# Patient Record
Sex: Male | Born: 1955 | Race: Black or African American | Hispanic: No | Marital: Married | Smoking: Never smoker
Health system: Southern US, Community
[De-identification: ages and names within clinical notes are randomized; demographics above are authoritative.]

## PROBLEM LIST (undated history)

## (undated) DIAGNOSIS — K592 Neurogenic bowel, not elsewhere classified: Secondary | ICD-10-CM

## (undated) DIAGNOSIS — G8220 Paraplegia, unspecified: Secondary | ICD-10-CM

## (undated) DIAGNOSIS — E2740 Unspecified adrenocortical insufficiency: Secondary | ICD-10-CM

## (undated) DIAGNOSIS — C801 Malignant (primary) neoplasm, unspecified: Secondary | ICD-10-CM

## (undated) DIAGNOSIS — E119 Type 2 diabetes mellitus without complications: Secondary | ICD-10-CM

## (undated) DIAGNOSIS — K56600 Partial intestinal obstruction, unspecified as to cause: Secondary | ICD-10-CM

## (undated) DIAGNOSIS — R310 Gross hematuria: Secondary | ICD-10-CM

## (undated) DIAGNOSIS — N319 Neuromuscular dysfunction of bladder, unspecified: Secondary | ICD-10-CM

## (undated) DIAGNOSIS — Z9289 Personal history of other medical treatment: Secondary | ICD-10-CM

## (undated) DIAGNOSIS — C9000 Multiple myeloma not having achieved remission: Secondary | ICD-10-CM

## (undated) DIAGNOSIS — D649 Anemia, unspecified: Secondary | ICD-10-CM

## (undated) DIAGNOSIS — D689 Coagulation defect, unspecified: Secondary | ICD-10-CM

## (undated) HISTORY — DX: Partial intestinal obstruction, unspecified as to cause: K56.600

## (undated) HISTORY — DX: Malignant (primary) neoplasm, unspecified: C80.1

## (undated) HISTORY — DX: Neuromuscular dysfunction of bladder, unspecified: N31.9

## (undated) HISTORY — DX: Neurogenic bowel, not elsewhere classified: K59.2

## (undated) HISTORY — DX: Multiple myeloma not having achieved remission: C90.00

---

## 1999-07-23 ENCOUNTER — Emergency Department (HOSPITAL_COMMUNITY): Admission: EM | Admit: 1999-07-23 | Discharge: 1999-07-23 | Payer: Self-pay | Admitting: Emergency Medicine

## 1999-07-23 ENCOUNTER — Encounter: Payer: Self-pay | Admitting: Emergency Medicine

## 1999-11-30 ENCOUNTER — Encounter: Admission: RE | Admit: 1999-11-30 | Discharge: 1999-11-30 | Payer: Self-pay | Admitting: Family Medicine

## 2002-05-20 ENCOUNTER — Encounter: Admission: RE | Admit: 2002-05-20 | Discharge: 2002-05-20 | Payer: Self-pay | Admitting: Family Medicine

## 2003-06-22 ENCOUNTER — Encounter: Admission: RE | Admit: 2003-06-22 | Discharge: 2003-06-22 | Payer: Self-pay | Admitting: Sports Medicine

## 2006-09-20 DIAGNOSIS — E669 Obesity, unspecified: Secondary | ICD-10-CM | POA: Insufficient documentation

## 2006-11-01 ENCOUNTER — Ambulatory Visit: Payer: Self-pay | Admitting: Family Medicine

## 2006-11-23 ENCOUNTER — Encounter (INDEPENDENT_AMBULATORY_CARE_PROVIDER_SITE_OTHER): Payer: Self-pay | Admitting: Family Medicine

## 2006-11-23 ENCOUNTER — Ambulatory Visit: Payer: Self-pay | Admitting: Family Medicine

## 2006-11-23 LAB — CONVERTED CEMR LAB
BUN: 13 mg/dL (ref 6–23)
CO2: 23 meq/L (ref 19–32)
Calcium: 8.7 mg/dL (ref 8.4–10.5)
Chloride: 106 meq/L (ref 96–112)
Cholesterol: 140 mg/dL (ref 0–200)
Creatinine, Ser: 0.91 mg/dL (ref 0.40–1.50)
Glucose, Bld: 70 mg/dL (ref 70–99)
HDL: 43 mg/dL (ref >39)
LDL Cholesterol: 88 mg/dL (ref 0–99)
Potassium: 4.1 meq/L (ref 3.5–5.3)
Sodium: 141 meq/L (ref 135–145)
Total CHOL/HDL Ratio: 3.3
Triglycerides: 46 mg/dL (ref <150)
VLDL: 9 mg/dL (ref 0–40)

## 2006-11-25 ENCOUNTER — Encounter (INDEPENDENT_AMBULATORY_CARE_PROVIDER_SITE_OTHER): Payer: Self-pay | Admitting: Family Medicine

## 2010-07-06 ENCOUNTER — Telehealth (INDEPENDENT_AMBULATORY_CARE_PROVIDER_SITE_OTHER): Payer: Self-pay | Admitting: *Deleted

## 2010-07-07 ENCOUNTER — Ambulatory Visit: Payer: Self-pay | Admitting: Family Medicine

## 2010-07-07 ENCOUNTER — Encounter: Payer: Self-pay | Admitting: *Deleted

## 2010-07-07 DIAGNOSIS — IMO0002 Reserved for concepts with insufficient information to code with codable children: Secondary | ICD-10-CM | POA: Insufficient documentation

## 2010-07-07 DIAGNOSIS — R42 Dizziness and giddiness: Secondary | ICD-10-CM | POA: Insufficient documentation

## 2010-07-07 DIAGNOSIS — R1084 Generalized abdominal pain: Secondary | ICD-10-CM | POA: Insufficient documentation

## 2010-07-08 ENCOUNTER — Encounter: Payer: Self-pay | Admitting: Family Medicine

## 2010-07-08 ENCOUNTER — Ambulatory Visit: Payer: Self-pay | Admitting: Family Medicine

## 2010-07-08 LAB — CONVERTED CEMR LAB
ALT: 49 units/L (ref 0–53)
AST: 43 units/L — ABNORMAL HIGH (ref 0–37)
Albumin: 3.5 g/dL (ref 3.5–5.2)
Alkaline Phosphatase: 57 units/L (ref 39–117)
BUN: 11 mg/dL (ref 6–23)
Basophils Absolute: 0 10*3/uL (ref 0.0–0.1)
Basophils Relative: 1 % (ref 0–1)
Bilirubin Urine: NEGATIVE
CEA: <0.5 ng/mL (ref 0.0–5.0)
CO2: 26 meq/L (ref 19–32)
Calcium: 9 mg/dL (ref 8.4–10.5)
Chloride: 103 meq/L (ref 96–112)
Creatinine, Ser: 0.73 mg/dL (ref 0.40–1.50)
Eosinophils Absolute: 0.3 10*3/uL (ref 0.0–0.7)
Eosinophils Relative: 3 % (ref 0–5)
Glucose, Bld: 104 mg/dL — ABNORMAL HIGH (ref 70–99)
Glucose, Urine, Semiquant: NEGATIVE
HCT: 31.2 % — ABNORMAL LOW (ref 39.0–52.0)
Hemoglobin: 11 g/dL — ABNORMAL LOW (ref 13.0–17.0)
Ketones, urine, test strip: NEGATIVE
Lymphocytes Relative: 26 % (ref 12–46)
Lymphs Abs: 2.2 10*3/uL (ref 0.7–4.0)
MCHC: 35.3 g/dL (ref 30.0–36.0)
MCV: 84.1 fL (ref 78.0–100.0)
Monocytes Absolute: 0.8 10*3/uL (ref 0.1–1.0)
Monocytes Relative: 9 % (ref 3–12)
Neutro Abs: 5.2 10*3/uL (ref 1.7–7.7)
Neutrophils Relative %: 61 % (ref 43–77)
Nitrite: NEGATIVE
PSA: 1.34 ng/mL (ref <=4.00)
Platelets: 248 10*3/uL (ref 150–400)
Potassium: 4.1 meq/L (ref 3.5–5.3)
Protein, U semiquant: NEGATIVE
RBC: 3.71 M/uL — ABNORMAL LOW (ref 4.22–5.81)
RDW: 17.2 % — ABNORMAL HIGH (ref 11.5–15.5)
Sodium: 139 meq/L (ref 135–145)
Specific Gravity, Urine: 1.02
Total Bilirubin: 0.3 mg/dL (ref 0.3–1.2)
Total Protein: 8.6 g/dL — ABNORMAL HIGH (ref 6.0–8.3)
Urobilinogen, UA: 0.2
WBC Urine, dipstick: NEGATIVE
WBC: 8.4 10*3/uL (ref 4.0–10.5)
pH: 6

## 2010-07-11 ENCOUNTER — Ambulatory Visit (HOSPITAL_COMMUNITY)
Admission: RE | Admit: 2010-07-11 | Discharge: 2010-07-11 | Payer: Self-pay | Source: Home / Self Care | Attending: Family Medicine | Admitting: Family Medicine

## 2010-07-11 ENCOUNTER — Ambulatory Visit: Payer: Self-pay

## 2010-07-11 DIAGNOSIS — N319 Neuromuscular dysfunction of bladder, unspecified: Secondary | ICD-10-CM | POA: Insufficient documentation

## 2010-07-12 ENCOUNTER — Telehealth: Payer: Self-pay | Admitting: Family Medicine

## 2010-07-13 ENCOUNTER — Inpatient Hospital Stay (HOSPITAL_COMMUNITY)
Admission: EM | Admit: 2010-07-13 | Discharge: 2010-07-29 | Disposition: A | Discharge status: Rehabilitation Facility | Payer: Self-pay | Source: Home / Self Care | Attending: Oncology | Admitting: Oncology

## 2010-07-13 ENCOUNTER — Ambulatory Visit: Payer: Self-pay | Admitting: Family Medicine

## 2010-07-13 ENCOUNTER — Encounter: Payer: Self-pay | Admitting: Family Medicine

## 2010-07-13 DIAGNOSIS — G8220 Paraplegia, unspecified: Secondary | ICD-10-CM | POA: Insufficient documentation

## 2010-07-14 ENCOUNTER — Inpatient Hospital Stay (HOSPITAL_COMMUNITY)
Admission: AD | Admit: 2010-07-14 | Discharge: 2010-07-17 | Disposition: A | Discharge status: Other Acute Inpatient Hospital | Payer: Self-pay | Attending: Oncology | Admitting: Oncology

## 2010-07-15 ENCOUNTER — Encounter: Payer: Self-pay | Admitting: Oncology

## 2010-07-17 ENCOUNTER — Inpatient Hospital Stay (HOSPITAL_COMMUNITY)
Admission: EM | Admit: 2010-07-17 | Discharge: 2010-07-29 | Disposition: A | Discharge status: Rehabilitation Facility | Payer: Self-pay | Attending: Neurosurgery | Admitting: Neurosurgery

## 2010-07-18 ENCOUNTER — Encounter (INDEPENDENT_AMBULATORY_CARE_PROVIDER_SITE_OTHER): Payer: Self-pay | Admitting: Neurosurgery

## 2010-07-18 HISTORY — PX: OTHER SURGICAL HISTORY: SHX169

## 2010-07-27 LAB — GLUCOSE, CAPILLARY
Glucose-Capillary: 112 mg/dL — ABNORMAL HIGH (ref 70–99)
Glucose-Capillary: 115 mg/dL — ABNORMAL HIGH (ref 70–99)
Glucose-Capillary: 140 mg/dL — ABNORMAL HIGH (ref 70–99)
Glucose-Capillary: 123 mg/dL — ABNORMAL HIGH (ref 70–99)

## 2010-07-28 LAB — GLUCOSE, CAPILLARY
Glucose-Capillary: 129 mg/dL — ABNORMAL HIGH (ref 70–99)
Glucose-Capillary: 128 mg/dL — ABNORMAL HIGH (ref 70–99)
Glucose-Capillary: 151 mg/dL — ABNORMAL HIGH (ref 70–99)
Glucose-Capillary: 117 mg/dL — ABNORMAL HIGH (ref 70–99)

## 2010-07-29 ENCOUNTER — Inpatient Hospital Stay (HOSPITAL_COMMUNITY)
Admission: RE | Admit: 2010-07-29 | Discharge: 2010-08-30 | DRG: 945 | Disposition: A | Discharge status: Home / Self Care | Payer: Managed Care, Other (non HMO) | Source: Other Acute Inpatient Hospital | Attending: Physical Medicine & Rehabilitation | Admitting: Physical Medicine & Rehabilitation

## 2010-07-29 DIAGNOSIS — Z5189 Encounter for other specified aftercare: Principal | ICD-10-CM

## 2010-07-29 DIAGNOSIS — K592 Neurogenic bowel, not elsewhere classified: Secondary | ICD-10-CM

## 2010-07-29 DIAGNOSIS — A6000 Herpesviral infection of urogenital system, unspecified: Secondary | ICD-10-CM

## 2010-07-29 DIAGNOSIS — K5900 Constipation, unspecified: Secondary | ICD-10-CM

## 2010-07-29 DIAGNOSIS — N390 Urinary tract infection, site not specified: Secondary | ICD-10-CM

## 2010-07-29 DIAGNOSIS — Z981 Arthrodesis status: Secondary | ICD-10-CM

## 2010-07-29 DIAGNOSIS — K560 Paralytic ileus: Secondary | ICD-10-CM

## 2010-07-29 DIAGNOSIS — C9000 Multiple myeloma not having achieved remission: Secondary | ICD-10-CM

## 2010-07-29 DIAGNOSIS — B961 Klebsiella pneumoniae [K. pneumoniae] as the cause of diseases classified elsewhere: Secondary | ICD-10-CM

## 2010-07-29 DIAGNOSIS — B952 Enterococcus as the cause of diseases classified elsewhere: Secondary | ICD-10-CM

## 2010-07-29 DIAGNOSIS — R339 Retention of urine, unspecified: Secondary | ICD-10-CM

## 2010-07-29 DIAGNOSIS — N319 Neuromuscular dysfunction of bladder, unspecified: Secondary | ICD-10-CM

## 2010-07-29 DIAGNOSIS — I10 Essential (primary) hypertension: Secondary | ICD-10-CM

## 2010-07-29 DIAGNOSIS — G992 Myelopathy in diseases classified elsewhere: Secondary | ICD-10-CM

## 2010-07-29 DIAGNOSIS — G8220 Paraplegia, unspecified: Secondary | ICD-10-CM

## 2010-07-29 LAB — GLUCOSE, CAPILLARY
Glucose-Capillary: 128 mg/dL — ABNORMAL HIGH (ref 70–99)
Glucose-Capillary: 110 mg/dL — ABNORMAL HIGH (ref 70–99)

## 2010-07-30 ENCOUNTER — Encounter: Payer: Self-pay | Admitting: Physical Medicine & Rehabilitation

## 2010-08-08 LAB — URINALYSIS, ROUTINE W REFLEX MICROSCOPIC
Bilirubin Urine: NEGATIVE
Ketones, ur: NEGATIVE mg/dL
Nitrite: NEGATIVE
Protein, ur: 30 mg/dL — AB
Specific Gravity, Urine: 1.014 (ref 1.005–1.030)
Urine Glucose, Fasting: NEGATIVE mg/dL
Urobilinogen, UA: 1 mg/dL (ref 0.0–1.0)
pH: 7.5 (ref 5.0–8.0)

## 2010-08-08 LAB — DIFFERENTIAL
Basophils Absolute: 0 10*3/uL (ref 0.0–0.1)
Basophils Relative: 0 % (ref 0–1)
Eosinophils Absolute: 0 10*3/uL (ref 0.0–0.7)
Eosinophils Relative: 0 % (ref 0–5)
Lymphocytes Relative: 7 % — ABNORMAL LOW (ref 12–46)
Lymphs Abs: 1 10*3/uL (ref 0.7–4.0)
Monocytes Absolute: 0.9 10*3/uL (ref 0.1–1.0)
Monocytes Relative: 6 % (ref 3–12)
Neutro Abs: 12.9 10*3/uL — ABNORMAL HIGH (ref 1.7–7.7)
Neutrophils Relative %: 87 % — ABNORMAL HIGH (ref 43–77)

## 2010-08-08 LAB — COMPREHENSIVE METABOLIC PANEL
ALT: 75 U/L — ABNORMAL HIGH (ref 0–53)
AST: 23 U/L (ref 0–37)
Albumin: 2.3 g/dL — ABNORMAL LOW (ref 3.5–5.2)
Alkaline Phosphatase: 80 U/L (ref 39–117)
BUN: 13 mg/dL (ref 6–23)
CO2: 27 mEq/L (ref 19–32)
Calcium: 8.9 mg/dL (ref 8.4–10.5)
Chloride: 99 mEq/L (ref 96–112)
Creatinine, Ser: 0.75 mg/dL (ref 0.4–1.5)
GFR calc Af Amer: >60 mL/min (ref >60)
GFR calc non Af Amer: >60 mL/min (ref >60)
Glucose, Bld: 123 mg/dL — ABNORMAL HIGH (ref 70–99)
Potassium: 4.2 mEq/L (ref 3.5–5.1)
Sodium: 131 mEq/L — ABNORMAL LOW (ref 135–145)
Total Bilirubin: 0.5 mg/dL (ref 0.3–1.2)
Total Protein: 8.6 g/dL — ABNORMAL HIGH (ref 6.0–8.3)

## 2010-08-08 LAB — CBC
HCT: 28.8 % — ABNORMAL LOW (ref 39.0–52.0)
Hemoglobin: 10 g/dL — ABNORMAL LOW (ref 13.0–17.0)
MCH: 30.4 pg (ref 26.0–34.0)
MCHC: 34.7 g/dL (ref 30.0–36.0)
MCV: 87.5 fL (ref 78.0–100.0)
Platelets: 219 10*3/uL (ref 150–400)
RBC: 3.29 MIL/uL — ABNORMAL LOW (ref 4.22–5.81)
RDW: 16 % — ABNORMAL HIGH (ref 11.5–15.5)
WBC: 14.8 10*3/uL — ABNORMAL HIGH (ref 4.0–10.5)

## 2010-08-08 LAB — URINE MICROSCOPIC-ADD ON

## 2010-08-08 LAB — URINE CULTURE
Colony Count: >=100000
Culture  Setup Time: 201201101135
Special Requests: NEGATIVE

## 2010-08-12 NOTE — H&P (Signed)
NAMEAEDAN, GEIMER               ACCOUNT NO.:  [id number]  MEDICAL RECORD NO.:  [id number]          PATIENT TYPE:  IPS  LOCATION:  4003                         FACILITY:  [facility]  PHYSICIAN:  Ranelle Oyster, M.D.DATE OF BIRTH:  Dec 29, 1955  DATE OF ADMISSION:  07/29/2010 DATE OF DISCHARGE:                             HISTORY & PHYSICAL   CHIEF COMPLAINT:  Lower extremity weakness, sensory loss.  PRIMARY CARE PHYSICIAN:  [facility].  NEUROSURGEONDanae Orleans. Venetia Maxon, MD  ONCOLOGIST:  Pierce Crane, MD  HISTORY OF PRESENT ILLNESS:  This is a 55 year old African American male with progressive mid back pain for 2-3 months and increasing weakness. MRI of the T and L-spine on July 13, 2010 showed diffuse abnormal marrow space at T8 with cord edema and encroachment upon the cord.  The patient was placed on steroid protocol, did receive two rounds of XRT after biopsy showed myeloma.  He was admitted to the [facility] on July 17, 2010, with increased weakness and pain.  His followup MRI showed increased tumor.  He underwent thoracotomy and T8 vertebrectomy with T7-T9 cage placement as well as chest tube on July 18, 2010 by Dr. Venetia Maxon.  Remains on Decadron 4 mg q.6 h., plan is slowly taper.  Subcu Lovenox was added on July 27, 2010 for DVT prophylaxis.  Back brace was placed when out of bed and don in supine. Hospital course was complicated by ileus and NG tube was stopped 3 days ago with the diet has slowly been advanced.  He is on OxyContin for pain control.  He has neurogenic bowel and bladder.  Oncology plans to follow up for treatment as indicated.  I was asked to evaluate the patient on July 26, 2010 and I felt he could benefit from an inpatient rehab stay.  REVIEW OF SYSTEMS:  Notable for weakness, numbness, left midback pain, urinary retention, constipation.  Bowel movement with post enema today. He has been sleeping fairly well.  Full  12-point review is in the written H and P.  PAST MEDICAL HISTORY:  Notable for left heel spur surgery, hypertension.  FAMILY HISTORY:  Positive diabetes and colon cancer.  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  He has one level house with the one step to enter.  Wife is a Production designer, theatre/television/film of [place] [employer].  He has 57 year old twins at home.  Does not smoke or drink.  ALLERGIES:  None.  HOME MEDICATIONS:  Aleve p.r.n.  LABORATORY DATA:  Hemoglobin 10.7, platelets 197, white count 19.3. Sodium 138, potassium 3.9, BUN 15, creatinine 0.9.  PHYSICAL EXAMINATION:  VITAL SIGNS:  Blood pressure is 163/81, pulse 65, respiratory rate 18, temperature 98.4. GENERAL:  The patient is pleasant and alert.  Pupils are equally round and reactive to light.  Ear, nose and throat exam is notable for intact dentition.  Pink moist mucosa. NECK:  Supple without JVD or lymphadenopathy. CHEST:  Clear except for some decreased sound in the left side. HEART:  Regular rate and rhythm without murmur, rubs, or gallop. EXTREMITIES:  Showed no clubbing, cyanosis, or edema.  ABDOMEN:  Soft, nontender.  Bowel sounds positive. SKIN:  Notable for left thoracotomy incision, which was clean with sutures.  The drain site was clean with minimal drainage.  Otherwise, skin was all intact. NEUROLOGIC:  Cranial nerves II through XII are normal.  Reflexes are 2+ in the upper extremities and absent in the lower extremities.  The patient is insensate below the T8 level.  The patient has no movement below the waist.  He is 5/5 in the upper extremities.  Judgment, orientation, memory and mood are all appropriate.  Also, the patient has a too small areas of breakdown just above the gluteal fold.  POST ADMISSION PHYSICIAN EVALUATION: 1. 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 2. The patient was admitted to  receive collaborative interdisciplinary     care between the physiatrist, rehab nursing staff, and therapy     team. 3. The patient's level of medical complexity and substantial therapy     needs in context of that medical necessity cannot be provided at a     lesser intensity of care. 4. The patient has experienced substantial functional loss from his     baseline.  Upon functional assessment when I performed a tonsil on     July 26, 2010, the patient was total-assist for basic bed     mobility and transfers.  He had dressing and bathing with OT and     with total assist.  Currently, he is +2 total-assist bed mobility,     60% max-assist to move for transfers and total-assist still for     lower body ADLs.  Judging by the patient's diagnosis, physical exam     and functional history, he has the potential for functional     progress which will result in measurable gains while in inpatient     rehab.  These gains will be of substantial and practical use upon     discharge to home in facilitating mobility and self-care.  Interim     changes in medical status since preadmission since my consult are     detailed above. 5. The physiatrist will provide 24-hour management of medical needs as     well as oversight of the therapy plan/treatment and provide     guidance as appropriate regarding interaction of the two.  Medical     problem list and plan are below. 6. A 24-hour rehab nursing team will assist in the management of the     patient's skin care needs as well as integration of therapy     concepts, techniques, education, bowel and bladder function, etc. 7. PT will assess and treat for lower extremity range of motion,     transfer, techniques, use of wheelchair, range of motion and     flexibility for lower extremities and tone management is     appropriate with goals modified independent at the wheelchair     level. 8. OT will assess and treat for upper extremity use ADLs, adaptive      techniques, equipment with goals all modified independent at the     wheelchair level. 9. Case management and social worker will assess and treat for     psychosocial issues and discharge planning. 10.Team conference will be held weekly to assess progress towards     goals and to determine barriers at discharge. 11.The patient has demonstrated sufficient medical stability and  exercise capacity to tolerate at least 3 hours of therapy per day     at least 5 days per week. 12.Estimated length of stay is 3 weeks.  Prognosis is good.  The     patient is extremely motivated and positive at this point.  MEDICAL PROBLEM LIST AND PLAN: 1. Deep venous thrombosis prophylaxis with subcu Lovenox 40 q.12 h.     We will check venous Dopplers also during this stay. 2. Pain management, oxycodone CR and IR, and Robaxin.  The patient     still with significant pain with transferring and this may need     further adjustment going forward. 3. Mood:  Provide ego support as appropriate. 4. Postoperative ileus and neurogenic bowel.  We will advance diet.     Daily suppository for bowel movements going forward with p.m.     laxative/softener. 5. Neurogenic bladder:  Continue Foley catheter for now, but we will     initiate voiding trial and intermittent cath next week. 6. Skin care:  Mepilex to buttock with decubitus precautions.  Also,     we will order PRAFOs for bilateral lower extremities.     Ranelle Oyster, M.D.     ZTS/MEDQ  D:  07/29/2010  T:  07/30/2010  Job:  [id number]  cc:   Danae Orleans. Venetia Maxon, M.D. Pierce Crane, MD  Electronically Signed by [name] M.D. on 08/12/2010 10:05:05 AM

## 2010-08-16 LAB — CBC
HCT: 26.8 % — ABNORMAL LOW (ref 39.0–52.0)
Hemoglobin: 9.3 g/dL — ABNORMAL LOW (ref 13.0–17.0)
MCH: 30.4 pg (ref 26.0–34.0)
MCHC: 34.7 g/dL (ref 30.0–36.0)
MCV: 87.6 fL (ref 78.0–100.0)
Platelets: 258 10*3/uL (ref 150–400)
RBC: 3.06 MIL/uL — ABNORMAL LOW (ref 4.22–5.81)
RDW: 17.1 % — ABNORMAL HIGH (ref 11.5–15.5)
WBC: 7 10*3/uL (ref 4.0–10.5)

## 2010-08-16 LAB — BASIC METABOLIC PANEL
BUN: 18 mg/dL (ref 6–23)
CO2: 30 mEq/L (ref 19–32)
Calcium: 8.8 mg/dL (ref 8.4–10.5)
Chloride: 100 mEq/L (ref 96–112)
Creatinine, Ser: 0.59 mg/dL (ref 0.4–1.5)
GFR calc Af Amer: >60 mL/min (ref >60)
GFR calc non Af Amer: >60 mL/min (ref >60)
Glucose, Bld: 109 mg/dL — ABNORMAL HIGH (ref 70–99)
Potassium: 4.4 mEq/L (ref 3.5–5.1)
Sodium: 133 mEq/L — ABNORMAL LOW (ref 135–145)

## 2010-08-19 LAB — GLUCOSE, CAPILLARY: Glucose-Capillary: 135 mg/dL — ABNORMAL HIGH (ref 70–99)

## 2010-08-23 NOTE — Assessment & Plan Note (Signed)
Summary: cpe/kh   Vital Signs:  Patient Profile:   55 Years Old Male Height:     69 inches Weight:      273.9 pounds Temp:     99.6 degrees F Pulse rate:   70 / minute BP sitting:   141 / 78  Pt. in pain?   no              Is Patient Diabetic? No   PCP:  Johney Maine MD  Chief Complaint:  CPE.  History of Present Illness: cc: yearly physical HPI:  55 yo male visiting MD for first time in 3 years.  Has been doing well w/o any complaints.  Has lost weight over past year after gaining since last visit.  Walks with wife.  Uses ab lounger and ab wheel, dumbells several days a week.  Has been eating low salt diet since wife has HTN.  WIfe has lost weigth as well.  Pt states he is feeling healthier.  Refuses colonoscopy referral today but will think about it soon.      Past Medical History:    Reviewed history from 09/20/2006 and no changes required:       elevated bp   Family History:    dad - glaucoma, DM    mom with ? CA, DM     siblings healthy  Social History:    Reviewed history from 09/20/2006 and no changes required:       lives in [place] with wife [name], and twin daughters christian and christopher.  No smoking, no etoh, no drugs.   Risk Factors:  Tobacco use:  never Alcohol use:  no   Review of Systems       Denies urinary frequency, burning, hesitancy.  Denies sexual dysfunction problems.  Denies ab pain, N/V, diarrhea, constipation   Physical Exam  General:     Well-developed,well-nourished,in no acute distress; alert,appropriate and cooperative throughout examination Head:     Normocephalic and atraumatic without obvious abnormalities. No apparent alopecia or balding. Eyes:     No corneal or conjunctival inflammation noted. EOMI. Perrla.  Nose:     External nasal examination shows no deformity or inflammation. Nasal mucosa are pink and moist without lesions or exudates. Mouth:     Oral mucosa and oropharynx without lesions or  exudates.  Teeth in good repair. Neck:     No deformities, masses, or tenderness noted. Chest Wall:     No deformities, masses, tenderness or gynecomastia noted. Lungs:     Normal respiratory effort, chest expands symmetrically. Lungs are clear to auscultation, no crackles or wheezes. Heart:     Normal rate and regular rhythm. S1 and S2 normal without gallop, murmur, click, rub or other extra sounds. Abdomen:     Bowel sounds positive,abdomen soft and non-tender without masses, organomegaly or hernias noted.  Obese abdomen Rectal:     No external abnormalities noted. Normal sphincter tone. No rectal masses or tenderness. Genitalia:     Testes bilaterally descended without nodularity, tenderness or masses. No scrotal masses or lesions. No penis lesions or urethral discharge. Prostate:     Prostate gland firm and smooth, no enlargement, nodularity, tenderness, mass, asymmetry or induration. Msk:     No deformity or scoliosis noted of thoracic or lumbar spine.   Pulses:     R and L carotid,radial,dorsalis pedis and posterior tibial pulses are full and equal bilaterally Extremities:     No clubbing, cyanosis, edema, or deformity noted with  normal full range of motion of all joints.   Neurologic:     No cranial nerve deficits noted. Station and gait are normal. DTRs are symmetrical throughout. Sensory, motor and coordinative functions appear intact. Skin:     Intact without suspicious lesions or rashes Cervical Nodes:     No lymphadenopathy noted Psych:     Cognition and judgment appear intact. Alert and cooperative with normal attention span and concentration. No apparent delusions, illusions, hallucinations    Impression & Recommendations:  Problem # 1:  Preventive Health Care (ICD-V70.0) Assessment: Unchanged Mr Duffey is doing well.  I praised him on his exercise regimine.  Pt cannot come to lab in am due to work so will come late afternoon for fasting lipid panel and BMP.  Was  instructed to only eat breakfast that day and skip lunch.  This is best we can do unless he has a morning off.    Problem # 2:  OBESITY, NOS (ICD-278.00) Pt has been working on exercise.  I know his wife well and she has also said he is exercising with her and has lost weight and eating well.  Pt will continue this plan.  BP is mildly elevated but on recheck 136/78.  Will check BP w/ wife at store and call if high.   Orders: [facility] - Est  40-64 yrs ([id number])  Future Orders: Lipid-[facility] ([id number]) ... 11/01/2007 Basic Met-[facility] ([id number]) ... 10/31/2007    Patient Instructions: 1)  Please schedule a follow-up appointment in 1 year. 2)  Continue exercising like you are doing!  You are doing a great job.  Mrs Bily---muscle weighs more than fat so it's ok if your  husband doesn't lose a lot of weight as long as his pants get looser!

## 2010-08-24 DIAGNOSIS — C9000 Multiple myeloma not having achieved remission: Secondary | ICD-10-CM

## 2010-08-24 DIAGNOSIS — K592 Neurogenic bowel, not elsewhere classified: Secondary | ICD-10-CM

## 2010-08-24 DIAGNOSIS — N319 Neuromuscular dysfunction of bladder, unspecified: Secondary | ICD-10-CM

## 2010-08-24 DIAGNOSIS — G8220 Paraplegia, unspecified: Secondary | ICD-10-CM

## 2010-08-24 LAB — CBC
HCT: 28.5 % — ABNORMAL LOW (ref 39.0–52.0)
Hemoglobin: 9.7 g/dL — ABNORMAL LOW (ref 13.0–17.0)
MCH: 30.5 pg (ref 26.0–34.0)
MCHC: 34 g/dL (ref 30.0–36.0)
MCV: 89.6 fL (ref 78.0–100.0)
Platelets: 210 10*3/uL (ref 150–400)
RBC: 3.18 MIL/uL — ABNORMAL LOW (ref 4.22–5.81)
RDW: 18.2 % — ABNORMAL HIGH (ref 11.5–15.5)
WBC: 5.3 10*3/uL (ref 4.0–10.5)

## 2010-08-24 LAB — BASIC METABOLIC PANEL
BUN: 12 mg/dL (ref 6–23)
CO2: 29 mEq/L (ref 19–32)
Calcium: 8.8 mg/dL (ref 8.4–10.5)
Chloride: 98 mEq/L (ref 96–112)
Creatinine, Ser: 0.57 mg/dL (ref 0.4–1.5)
GFR calc Af Amer: >60 mL/min (ref >60)
GFR calc non Af Amer: >60 mL/min (ref >60)
Glucose, Bld: 129 mg/dL — ABNORMAL HIGH (ref 70–99)
Potassium: 4.2 mEq/L (ref 3.5–5.1)
Sodium: 133 mEq/L — ABNORMAL LOW (ref 135–145)

## 2010-08-24 LAB — IGG: IgG (Immunoglobin G), Serum: 4880 mg/dL — ABNORMAL HIGH (ref 694–1618)

## 2010-08-24 NOTE — Op Note (Addendum)
[name], [name]               ACCOUNT NO.:  [id number]  MEDICAL RECORD NO.:  [id number]          PATIENT TYPE:  INP  LOCATION:  3112                         FACILITY:  [facility]  PHYSICIAN:  Danae Orleans. Venetia Maxon, M.D.  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  PROCEDURES: 1. Thoracotomy. 2. T8 vertebrectomy. 3. T7-T9 expandable cage placement. 4. Autograft bone graft. 5. Lateral plate Z6-X0. 6. Chest tube placement.  SURGEON:  Danae Orleans. Venetia Maxon, MD  ASSISTANT:  Channing Mutters.  ANESTHESIA:  General endotracheal anesthesia.  ESTIMATED BLOOD LOSS:  900 mL with 2 units of packed red cells given to the patient.  COMPLICATIONS:  None.  DISPOSITION:  To Recovery.  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  He was getting quite significant worsening in neurologic function while receiving palliative radiation therapy.  He did not have a tissue diagnosis as of yet, although he had a biopsy.  Repeat MRI did not show worsening of the tumor, although there was a white spot in the spinal cord, either suggestive of edema or infarct.  The patient has sacral sparing and some movement in his legs, although he is not able to bear weight.  He also has numbness at a T10 level.  We would like to take him surgery for decompression of spinal cord and reconstruction.  PROCEDURE IN DETAILS:  [name] was brought to the operating room. Following satisfactory and uncomplicated induction of general endotracheal anesthesia plus intravenous lines, Foley catheter, arterial line and central line, he was placed in a right lateral decubitus position with an axillary roll and his left side was  placed up.  He was positioned with using x-ray and counting from the sacrum up to identify the T8 level.  The T9 and T7 levels were then marked as was the rib traversing this field.  The area of planned incision was prepped with DuraPrep and then infiltrated with local lidocaine.  Incision was made overlying the T8 vertebra, carried to the overlying rib which was carefully dissected from investing soft tissue and Doyen rib elevators were used to clear the rib of investing tissues and to preserve the neurovascular bundle below this rib.  The rib was then cut both proximally and distally and rib autograft was used for bone grafting. Using a finger sweep, I was able to get access to the retropleural space overlying T8.  We used minimally-invasive NuVasive retractor system with 150 mm blades and the T8 level was further exposed.  There was a significant amount of soft tissue within the paravertebral space consistent with metastatic cancer.  Multiple biopsies were sent for pathologic evaluation.  The T8-9 and T7-8 disk spaces were identified and after the overlying soft tissue was cleared, a thorough vertebrectomy was performed with removal of the T8 vertebral body. Subsequently, the spinal canal was then decompressed and the dura was decompressed  over the length of the T8 vertebral body.  After hemostasis assured with Gelfoam after trial sizing, it was elected to use a 30-mm footplate x 25-mm implant which fit well within this vertebrectomy defect, was packed with morselized bone autograft from the rib.  It was then inserted and tamped into position.  Initially, it appeared to be more posteriorly placed along at T8-9 level and consequently additional bone was removed anteriorly and it was repositioned, cranked into expanded position, appeared to fit well overlying the T7-T9 levels.  A 30-mm lateral plate was then affixed to the thoracic spine with 35-mm anterior and 40-mm posterior screws.   Two at T7 and two at T9.  All screws had excellent purchase and their position was confirmed on AP and lateral fluoroscopy and locked down in situ.  Wound was then irrigated. There was a pleural opening in it and was elected to place a chest tube. This was done through a separate stab incision.  The retractor system was removed.  The chest tube was placed and anchored and the fascia and muscular layers were closed with 0 Vicryl sutures and subcutaneous tissues were approximated with 2-0 Vicryl interrupted inverted sutures and skin edges were approximated with 3-0 nylon running lock stitch. The wound was dressed with Betadine, Telfa, gauze and tape.  The patient was then extubated in the operating room, taken to the recovery room in stable satisfactory condition having tolerated the operations well. Counts were correct at the end of the case.     Danae Orleans. Venetia Maxon, M.D.     JDS/MEDQ  D:  07/18/2010  T:  07/19/2010  Job:  [id number]  Electronically Signed by Maeola Harman M.D. on 07/27/2010 08:08:02 AM

## 2010-08-25 NOTE — Initial Assessments (Signed)
Summary: Back Pain    PCP:  Gary Mayhew MD   History of Present Illness: 55 yo sent to ER from office for worsening back pain x 2-3 months.  Started in October with bilateral foot tingling, then began having lower back pain radiating down both legs.  In the past month has started to have difficulty walking with ataxia, having to hold onto walls.  Also endorsing dizzines, nausea, abdominal pressure, urinary retention, saddle anesthesia, constipation.    Back pain is worst with bending, has pain that wakes him up at night as well as pain when awakening before getting out of bed.    Denies focal weakness, confusion headache, weight loss, melena, hematochezia, cough, SOB.  ROS neg x 10 systems unless otherwise mentioned.   Physical Exam  General:  Obese, AAM, NAD at rest, very uncomfortable with pain when moving in bed Head:  [place]/AT Eyes:  EOMI, PERRLA Ears:  TM normal B Mouth:  OMM, no lesions Neck:  no LAD Lungs:  coarse BS bilaterally Heart:  RRR, no MRG Abdomen:  + BS, soft, no rebound or guarding.  Tender to palpation in Left > R LQ.   Rectal:  decreased rectal tone.  absent anal wink.  normal sensation around rectum.   Prostate:  unable to appreciate Msk:  stregnth in UE and LE 5/5 bilaterally.  strength in proximal LE limited by pain. Pulses:  DP, PT pulses 2+ bilaterally Extremities:  no LE edema.   Neurologic:  alert & oriented X3, cranial nerves II-XII intact, and strength normal in all extremities.  diminished sensation to light touch in LE, worsening distally.  Decreased 2 point discrimination in LE R>L.  finger to nose normal.  Heel to shin limited by pain.  no focal weakness.  Rhomberg unsteady. Skin:  no rashes Psych:  Oriented X3.   Additional Exam:  Labs:  CBC:    WBC                                      8.9               4.0-10.5         K/uL  RBC                                      4.23              4.22-5.81        MIL/uL  Hemoglobin (HGB)                          12.9       l      13.0-17.0        g/dL  Hematocrit (HCT)                         35.8       l      39.0-52.0        %  MCV                                      84.6  78.0-100.0       fL  MCH -                                    30.5              26.0-34.0        pg  MCHC                                     36.0              30.0-36.0        g/dL  RDW                                      16.6       h      11.5-15.5        %  Platelet Count (PLT)                     266               150-400          K/uL  Neutrophils, %                           63                43-77            %  Lymphocytes, %                           26                12-46            %  Monocytes, %                             8                 3-12             %  Eosinophils, %                           2                 0-5              %  Basophils, %                             1                 0-1              %  Neutrophils, Absolute                    5.6               1.7-7.7          K/uL  Lymphocytes, Absolute  2.3               0.7-4.0          K/uL  Monocytes, Absolute                      0.7               0.1-1.0          K/uL  Eosinophils, Absolute                    0.2               0.0-0.7          K/uL  Basophils, Absolute                      0.1               0.0-0.1      Hemoccult neg     Past History:  Family History: Last updated: 07/13/2010 dad - glaucoma, DM mom with Colon CA, DM siblings healthy  Social History: Last updated: 07/13/2010 lives in [place] with wife [name], and twin daughters [name] and [name] age 65.  No smoking, no etoh, no drugs.  12th grade education.  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  Past Medical History: Elevated Blood pressue  Past Surgical History: Left heel spur removal   Family History: dad - glaucoma, DM mom with Colon CA, DM siblings healthy  Social History: lives in [place] with wife [name], and  twin daughters [name] and [name] age 91.  No smoking, no etoh, no drugs.  12th grade education.  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  MRI L spine:   1.  Salient abnormality is diffusely abnormal bone marrow signal   with 2 superimposed areas of more focal marrow edema in the L2   vertebral body and left sacral ala.   This constellation could represent metastatic bone disease or   malignant marrow infiltrative process with 2 pathologic fractures.   Or alternatively a benign marrow infiltrative process with either   isolated bone metastases or atypical hemangiomas.   Recommend clinical correlation and as far as additional imaging is   concerned, nuclear medicine whole body bone scan may have the   highest yield.   2.  Negative for age lumbar discs.  There is multilevel lumbar   facet arthropathy which is maximal at L3-L4 and L5-S1.  No spinal   stenosis or convincing neural impingement.  Impression & Recommendations:  Problem # 1:  BACK PAIN, LUMBAR, WITH RADICULOPATHY (ICD-724.4) MRI results concernening for malignancy.  reviewed MRI results with radiology- possible bone mets vs marrow abnormality.  Will get MRI head, cervical, and thoracic spine to further evaluate for cause of neurological findings.    CBC with diff normal today.  Labwork in office shows normal U/A, CMET, PSA, CEA.  WIll obtain sed rate, CXR.  Problem # 2:  FEN/GI Regular diet  Problem # 3:  Propohylaxis Lovenox 40 Subcutaneously qday, by mouth diet  Problem # 4:  Disposition pending diagnosis and plan for source of back pain and neurological findings.  Complete Medication List: 1)  Oxycodone-acetaminophen 7.5-325 Mg Tabs (Oxycodone-acetaminophen) .... Take one tab every 4 hr as needed pain

## 2010-08-25 NOTE — Letter (Signed)
Summary: Out of Work  [facility]  [address]   [place], [place] [address]   Phone: [phone]  Fax: [phone]    July 07, 2010   Employee:  JOHNATHYN VIS[employer]    To Whom It May Concern:   For Medical reasons, please excuse the above named employee from work for the following dates:  Start:   July 07, 2010  End:   July 09, 2010  If you need additional information, please feel free to contact our office.         Sincerely,    Arlyss Repress CMA,

## 2010-08-25 NOTE — Assessment & Plan Note (Signed)
Summary: back pain/kh   Vital Signs:  Patient profile:   55 year old male Weight:      272 pounds Temp:     99.3 degrees F oral Pulse rate:   84 / minute BP sitting:   124 / 80  (left arm) Cuff size:   large  Vitals Entered By: Loralee Pacas CMA (July 13, 2010 8:54 AM)  Primary Care Provider:  Ellin Mayhew MD   History of Present Illness: 1. Back Pain, Possible Cord Compression - patient being follow for progression of neurological symptoms/pain.  He currently has:  - back pain and abdominal pain - inability to walk without supporting a wall - urinary retention - constipation - saddle anesthesia going down to legs.    Current Medications (verified): 1)  Oxycodone-Acetaminophen 7.5-325 Mg Tabs (Oxycodone-Acetaminophen) .... Take One Tab Every 4 Hr As Needed Pain  Allergies (verified): No Known Drug Allergies PMH-FH-SH reviewed for relevance  Review of Systems General:  Denies chills and fever. CV:  Denies chest pain or discomfort, palpitations, and shortness of breath with exertion. Resp:  Denies cough and wheezing. GI:  Complains of abdominal pain, constipation, and nausea; denies diarrhea and vomiting. GU:  Denies incontinence. MS:  Complains of loss of strength and low back pain; denies joint swelling. Neuro:  Complains of numbness, poor balance, tingling, and weakness; denies headaches and visual disturbances.  Physical Exam  General:  Obese AAM sitting in wheelchair, unconfortable-appearing. Vitals reviewed. Pulses:  R and L dorsalis pedis and posterior tibial pulses are full and equal bilaterally. Extremities:  No clubbing, cyanosis, edema. Neurologic:  Abnormal gait, RLE hyperreflexia, and LLE hyperreflexia. Sensory loss in the saddle distribution. No foot drop. Psych:  Oriented X3, memory intact for recent and remote, and tearful.     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  Rivka Safer. Today's, H&P c/w upper cord compression. Sending directly to ER for MRI and possible neurosurgery consult, depending on results.  Complete Medication List: 1)  Oxycodone-acetaminophen 7.5-325 Mg Tabs (Oxycodone-acetaminophen) .... Take one tab every 4 hr as needed pain  Other Orders: Neurology Referral (Neuro)   Orders Added: 1)  Neurology Referral [Neuro]

## 2010-08-25 NOTE — Progress Notes (Signed)
  Phone Note Outgoing Call Call back at [facility] [phone] Call back at [phone]   Call placed by: Edd Arbour,  July 12, 2010 8:35 AM Call placed to: Patient Action Taken: Phone Call Completed Details for Reason: f/u MRI Details of Action Taken: told patient to reschedule MRI Summary of Call: I call [name] to make sure he had his MRI yesterday of his spine. He was not able to complete the procedure becuase of claustrophobia. I had him call the [facility] office to schedule an open faced MRI ASAP. If this cannot be done in a timely fashion I want to refer him immediately to the next Neurosurgeon available. Initial call taken by: Edd Arbour,  July 12, 2010 8:36 AM  Follow-up for Phone Call        pls call [name] as soon as MRI is sched Follow-up by: De Nurse,  July 12, 2010 8:38 AM  Additional Follow-up for Phone Call Additional follow up Details #1::        pt is asking to speak w/ wife again. Additional Follow-up by: De Nurse,  July 12, 2010 8:59 AM

## 2010-08-25 NOTE — Assessment & Plan Note (Signed)
Summary: F/U LABS/KH   Vital Signs:  Patient profile:   54 year old male Weight:      272 pounds Temp:     98.5 degrees F oral Pulse rate:   80 / minute Pulse rhythm:   regular BP sitting:   137 / 80  (left arm) Cuff size:   large  Vitals Entered By: Loralee Pacas CMA (July 11, 2010 1:47 PM) CC: lower back pain Pain Assessment Patient in pain? yes     Location: lower back Intensity: 10 Type: numbness, tingling Onset of pain  Constant Comments pt has lower back pain that radiates to his stomach   Primary Provider:  Ellin Mayhew MD  CC:  lower back pain.  History of Present Illness: 1. Spinal stenosis, Cauda Equina Syndrome patient being follow for progression of neurological symptoms/pain. He currently has:  - back pain and abdominal pain - inability to walk without supporting a wall - urinary retention -constipation -saddle anesthesia going down to legs.  all his symptoms are worse since when he was seen on friday. He is taking vicodin for his pain.  Review of Systems       see hpi  Physical Exam  General:  obese AAM Head:  Normocephalic and atraumatic without obvious abnormalities. No apparent alopecia or balding. Neurologic:  abnormal gait, RLE hyperreflexia, and LLE hyperreflexia.  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  They agree to have an MRI of the lumbar/thoracic spine today at [facility]. We will follow the results and likely refer to a neurosurgeon, or have him admitted to the hospital. Percocet for pain. Orders: [facility]- Est  Level 4 (16109) MRI without Contrast (MRI w/o Contrast)  Complete Medication List: 1)  Oxycodone-acetaminophen 7.5-325 Mg Tabs (Oxycodone-acetaminophen) .... Take one tab every 4 hr as  needed pain  Patient Instructions: 1)  Immediately go to [facility] for an MRI of the spine. 2)  if your symptoms continue to deteriorate, go to the emergency room.   Orders Added: 1)  [facility]- Est  Level 4 [60454] 2)  MRI without Contrast [MRI w/o Contrast]

## 2010-08-25 NOTE — Progress Notes (Signed)
Summary: Triage  Phone Note Call from Patient Call back at [phone]   Caller: Spouse/Jackie Reason for Call: Talk to Nurse Summary of Call: pt scheduled for an appt mon for legs tingling (he has not been seen in 3 years), wife does not think he can wait & would like triage RN to call.  Initial call taken by: Knox Royalty,  July 06, 2010 9:24 AM  Follow-up for Phone Call        spoke with wife and she states he has had tingling in legs since October. now he is having difficulty with walking . has back pain and tingling in legs left worse than right. offered appointment today at 3:15 but patient will not be able to come then. appointment scheduled for tomorrow at 4:15 PM. Follow-up by: Theresia Lo RN,  July 06, 2010 10:17 AM

## 2010-08-25 NOTE — Assessment & Plan Note (Signed)
Summary: tingling in legs , back pain, difficulty walking/ls   Vital Signs:  Patient profile:   55 year old male Height:      69 inches Weight:      272 pounds BMI:     40.31 Temp:     98.6 degrees F oral Pulse rate:   69 / minute BP sitting:   127 / 78  (left arm) Cuff size:   large  Vitals Entered By: Tessie Fass CMA (July 07, 2010 4:12 PM) CC: lower back pain radiating down both legs, Back Pain Is Patient Diabetic? No Pain Assessment Patient in pain? yes     Location: lower back, bilateral leg Intensity: 10   Primary Provider:  Ellin Mayhew MD  CC:  lower back pain radiating down both legs and Back Pain.  History of Present Illness: 1. Back/Side/Abdominal pain associated with gait ataxia.  [name] is a 55 y/o aam who presented in a significant amount of pain and discomfort becuase of a progressively worsening pain. In October of this year he noticed pain that was at the bottoms of his feet, he did not think much of this, and continued to work. The pain spread from his feet and eventually he had  pain around his flank and sides including his abdomen. He also started to experience foot swelling with vascular congestion. This symptoms were associated with a worsening balance and inability to lift legs.  He has been taking OTC NSAIDS for the pain without relief.  On exam the gentleman is obviously in distress. His vitals were normal, but he was sweating and had tears in his eyes because the pain was so debilitating. He had a lot of trouble moving around and had to do every slowly because of pain. his abdomen was very tender to palpation especially in the epigastric region and b/l lower quadrants. He had no spine tenderness thoracic or lumbar region. He had slightly hypertonic DTR in his legs and had clonus in his feet. He had sensory loss in his groin area including his scrotum and penis which was a new finding.  Other symptoms include pencil thin stools, hemoccult positive  in the office. He has no weight loss, no night sweats, no fever.  He had no traumatic event. His abdominal pain is worsened when he coughs or laughs.  2. Urinary retention, frequency Patient has to urinate 3 or more times per night. He does not feel as though he has complete emptying.  Back Pain History:      The patient's back pain started approximately 05/11/2010.  The pain is located in the lower back region and does radiate below the knees.  He states this is not work related.  On a scale of 1-10, he describes the pain as a 10.  He states that he has no prior history of back pain.  The patient has not had any recent physical therapy for his back pain.  The following makes the back pain better: not moving.  The following makes the back pain worse: moving, laughing, coughing.    Critical Exclusionary Diagnosis Criteria (CEDC) for Back Pain:      The patient denies a history of previous trauma.  He has no prior history of spinal surgery.  There are no symptoms to suggest infection.  Cancer risk factors include age >50 yrs with new back pain.  Symptoms to suggest the possibility of cauda equina syndrome include urinary retention, saddle anesthesia, and motor deficit of lower extremity.  Other  positive CEDC factors include low back pain worse with activity and progressive neurologic symptoms.    Past History:  Past Medical History: Last updated: 09/20/2006 elevated bp  Family History: Last updated: 07/07/2010 dad - glaucoma, DM mom with ? CA, DM  siblings healthy  Social History: Last updated: 09/20/2006 lives in [place] with wife [name], and twin daughters christian and christopher.  No smoking, no etoh, no drugs.  Risk Factors: Smoking Status: never (11/01/2006)  Family History: Reviewed history from 11/01/2006 and no changes required. dad - glaucoma, DM mom with ? CA, DM  siblings healthy  Social History: Reviewed history from 09/20/2006 and no changes required. lives  in [place] with wife [name], and twin daughters christian and christopher.  No smoking, no etoh, no drugs.  Review of Systems       see HPI  Physical Exam  General:  Obese, AAM, in distress Lungs:  Normal respiratory effort, chest expands symmetrically. Lungs are clear to auscultation, no crackles or wheezes. Heart:  Normal rate and regular rhythm. S1 and S2 normal without gallop, murmur, click, rub or other extra sounds. Abdomen:  diffusely tender, nondistended, umbilical hernia scar, no guarding, no rigidity. Rectal:  normal sphincter tone, no masses, stool positive for occult blood, and perirectal tenderness.   Prostate:  no gland enlargement, no nodules, no asymmetry, and no induration.   Pulses:  R and L carotid,radial,femoral,dorsalis pedis and posterior tibial pulses are full and equal bilaterally Neurologic:  alert & oriented X3, cranial nerves II-XII intact, ataxic gait, RLE hyperreflexia, and LLE hyperreflexia.   groin loss of sensation  Low Back Pain Physical Exam:    Inspection-deformity:     No    Palpation-spinal tenderness:   No    Sensory Exam/Pinprick:        Left Medial Foot (L4):   normal       Left Dorsal Foot (L5):   normal       Left Lateral Foot (S1):   normal       Right Medial Foot (L4):   normal       Right Dorsal Foot (L5):   normal       Right Lateral Foot (S1):   normal    Reflexes:        Left Knee Jerk (L4):     normal       Right Knee Jerk:     normal    Straight Leg Raise (SLR):       Left Straight Leg Raise (SLR):   negative       Right Straight Leg Raise (SLR):   negative   Impression & Recommendations:  Problem # 1:  BACK PAIN, LUMBAR, WITH RADICULOPATHY (ICD-724.4) His symptoms suggest a spinal problem, either stenosis, or a rupture disk. He will need an MRI of his spine, at this time the patient wants to wait for his insurance to kick in on the first of January. I advised him to be seen in the ER for quick work up and pain control,  but he did not want to incur the expense at this time. The patient could have permanent spinal cord damage and needs to have an MRI done. I will call him and stress the importance of this MRI.  His updated medication list for this problem includes:    Oxycodone-acetaminophen 7.5-325 Mg Tabs (Oxycodone-acetaminophen) .Marland Kitchen... Take one tab every 4 hr as needed pain  Orders: [facility]- Est  Level 4 (99214) Morphine Sulfate inj 10  mg (J2270)Future Orders: CBC w/Diff-[facility] (16109) ... 07/08/2010 Urinalysis-[facility] (00000) ... 07/08/2010 Comp Met-[facility] ([id number]) ... 07/08/2010 PSA-[facility] [id number]) ... 07/08/2010 T-CEA (13086-57846) ... 07/08/2010  Problem # 2:  DISEQUILIBRIUM (ICD-780.4) This is likely related to dorsal column involvement in what ever pathology he has affecting his spine. Orders: [facility]- Est  Level 4 (99214)Future Orders: CBC w/Diff-[facility] (96295) ... 07/08/2010 Urinalysis-[facility] (00000) ... 07/08/2010 Comp Met-[facility] ([id number]) ... 07/08/2010 PSA-[facility] [id number]) ... 07/08/2010 T-CEA (03474-25956) ... 07/08/2010  Problem # 3:  ABDOMINAL PAIN, GENERALIZED (ICD-789.07) It is unusual that he has such bad abdominal pain since his findings suggest spine/spinal cord. He did have Heme positive stools, and pencil thin nature. I will await the results of his CEA and consider a CT scan of his abdomen depending on how things pan out. He is also due for a colonoscopy, but this will have to wait till his spine is evaluated.  Future Orders: CBC w/Diff-[facility] (38756) ... 07/08/2010 Urinalysis-[facility] (00000) ... 07/08/2010 Comp Met-[facility] ([id number]) ... 07/08/2010 PSA-[facility] [id number]) ... 07/08/2010 T-CEA (10932-35573) ... 07/08/2010  Problem # 4:  Screening PSA (ICD-V76.44) We obtained a PSA because of urinary symptoms of retention and frequency at night.  Problem # 5:  OBESITY, NOS (ICD-278.00)  now really isnt the time to be worried about diet, but his weight will be a major factor in the future if he  needs spine surgery.  Orders: [facility]- Est  Level 4 (22025)  Complete Medication List: 1)  Oxycodone-acetaminophen 7.5-325 Mg Tabs (Oxycodone-acetaminophen) .... Take one tab every 4 hr as needed pain   Patient Instructions: 1)  Please schedule a follow-up appointment in 2 weeks. 2)  If you are unable to walk, or feel you are getting worse, go directly to the ER. Prescriptions: OXYCODONE-ACETAMINOPHEN 7.5-325 MG TABS (OXYCODONE-ACETAMINOPHEN) Take one tab every 4 hr as needed pain  #40 x 0   Entered by:   Zachery Dauer MD   Authorized by:   Edd Arbour   Signed by:   Zachery Dauer MD on 07/08/2010   Method used:   Print then Give to Patient   RxID:   [id number] OXYCODONE-ACETAMINOPHEN 7.5-325 MG TABS (OXYCODONE-ACETAMINOPHEN) Take one tab every 4 hr as needed pain  #40 x 0   Entered by:   Zachery Dauer MD   Authorized by:   Edd Arbour   Signed by:   Zachery Dauer MD on 07/08/2010   Method used:   Print then Give to Patient   RxID:   [id number]    Medication Administration  Injection # 1:    Medication: Morphine Sulfate inj 10 mg    Diagnosis: BACK PAIN, LUMBAR, WITH RADICULOPATHY (ICD-724.4)    Route: IM    Site: R deltoid    Exp Date: 08/25/2011    Lot #: [id number]    Mfr: [email or web address]    Comments: pt did not drive. pt's wife is driving.    Patient tolerated injection without complications    Given by: Arlyss Repress CMA, (July 07, 2010 5:36 PM)  Orders Added: 1)  [facility]- Est  Level 4 [99214] 2)  CBC w/Diff-[facility] [85025] 3)  Urinalysis-[facility] [00000] 4)  Morphine Sulfate inj 10 mg [J2270] 5)  Comp Met-[facility] [80053-22900] 6)  PSA-[facility] [46270-35009] 7)  T-CEA [82378-23770]     Medication Administration  Injection # 1:    Medication: Morphine Sulfate inj 10 mg    Diagnosis: BACK PAIN, LUMBAR, WITH RADICULOPATHY (ICD-724.4)    Route: IM    Site: R deltoid    Exp  Date: 08/25/2011    Lot #: [id number]    Mfr: [email or web address]    Comments: pt did not drive. pt's wife is driving.     Patient tolerated injection without complications    Given by: Arlyss Repress CMA, (July 07, 2010 5:36 PM)  Orders Added: 1)  [facility]- Est  Level 4 [99214] 2)  CBC w/Diff-[facility] [85025] 3)  Urinalysis-[facility] [00000] 4)  Morphine Sulfate inj 10 mg [J2270] 5)  Comp Met-[facility] [80053-22900] 6)  PSA-[facility] [57846-96295] 7)  T-CEA [82378-23770]

## 2010-08-26 LAB — IMMUNOFIXATION ELECTROPHORESIS
IgA: 10 mg/dL — ABNORMAL LOW (ref 68–378)
IgG (Immunoglobin G), Serum: 5660 mg/dL — ABNORMAL HIGH (ref 694–1618)
IgM, Serum: 14 mg/dL — ABNORMAL LOW (ref 60–263)
Total Protein ELP: 8.7 g/dL — ABNORMAL HIGH (ref 6.0–8.3)

## 2010-08-27 DIAGNOSIS — N319 Neuromuscular dysfunction of bladder, unspecified: Secondary | ICD-10-CM

## 2010-08-27 DIAGNOSIS — C9000 Multiple myeloma not having achieved remission: Secondary | ICD-10-CM

## 2010-08-27 DIAGNOSIS — G8220 Paraplegia, unspecified: Secondary | ICD-10-CM

## 2010-08-27 DIAGNOSIS — K592 Neurogenic bowel, not elsewhere classified: Secondary | ICD-10-CM

## 2010-08-30 ENCOUNTER — Encounter: Payer: Self-pay | Admitting: *Deleted

## 2010-08-30 ENCOUNTER — Telehealth: Payer: Self-pay | Admitting: Family Medicine

## 2010-09-01 LAB — PROTEIN ELECTROPH W RFLX QUANT IMMUNOGLOBULINS
Albumin ELP: 33.2 % — ABNORMAL LOW (ref 55.8–66.1)
Alpha-1-Globulin: 3.6 % (ref 2.9–4.9)
Alpha-2-Globulin: 11.8 % (ref 7.1–11.8)
Beta 2: 1.3 % — ABNORMAL LOW (ref 3.2–6.5)
Beta Globulin: 7.1 % (ref 4.7–7.2)
Gamma Globulin: 43 % — ABNORMAL HIGH (ref 11.1–18.8)
M-Spike, %: 3.62 g/dL
Total Protein ELP: 8.7 g/dL — ABNORMAL HIGH (ref 6.0–8.3)

## 2010-09-01 LAB — IGG, IGA, IGM
IgA: 9 mg/dL — ABNORMAL LOW (ref 68–378)
IgG (Immunoglobin G), Serum: 5170 mg/dL — ABNORMAL HIGH (ref 694–1618)
IgM, Serum: 12 mg/dL — ABNORMAL LOW (ref 60–263)

## 2010-09-02 ENCOUNTER — Encounter (HOSPITAL_BASED_OUTPATIENT_CLINIC_OR_DEPARTMENT_OTHER): Payer: Managed Care, Other (non HMO) | Admitting: Oncology

## 2010-09-02 ENCOUNTER — Other Ambulatory Visit: Payer: Self-pay | Admitting: Oncology

## 2010-09-02 DIAGNOSIS — C7931 Secondary malignant neoplasm of brain: Secondary | ICD-10-CM

## 2010-09-02 DIAGNOSIS — C7949 Secondary malignant neoplasm of other parts of nervous system: Secondary | ICD-10-CM

## 2010-09-02 DIAGNOSIS — Z5112 Encounter for antineoplastic immunotherapy: Secondary | ICD-10-CM

## 2010-09-02 DIAGNOSIS — C9000 Multiple myeloma not having achieved remission: Secondary | ICD-10-CM

## 2010-09-02 LAB — COMPREHENSIVE METABOLIC PANEL
ALT: 39 U/L (ref 0–53)
AST: 22 U/L (ref 0–37)
Albumin: 2.3 g/dL — ABNORMAL LOW (ref 3.5–5.2)
Alkaline Phosphatase: 56 U/L (ref 39–117)
BUN: 22 mg/dL (ref 6–23)
CO2: 27 mEq/L (ref 19–32)
Calcium: 9.3 mg/dL (ref 8.4–10.5)
Chloride: 96 mEq/L (ref 96–112)
Creatinine, Ser: 0.75 mg/dL (ref 0.40–1.50)
Glucose, Bld: 133 mg/dL — ABNORMAL HIGH (ref 70–99)
Potassium: 4.2 mEq/L (ref 3.5–5.3)
Sodium: 133 mEq/L — ABNORMAL LOW (ref 135–145)
Total Bilirubin: 0.6 mg/dL (ref 0.3–1.2)
Total Protein: 9.8 g/dL — ABNORMAL HIGH (ref 6.0–8.3)

## 2010-09-02 LAB — CBC WITH DIFFERENTIAL/PLATELET
BASO%: 0.1 % (ref 0.0–2.0)
Basophils Absolute: 0 10*3/uL (ref 0.0–0.1)
EOS%: 0.1 % (ref 0.0–7.0)
Eosinophils Absolute: 0 10*3/uL (ref 0.0–0.5)
HCT: 31.1 % — ABNORMAL LOW (ref 38.4–49.9)
HGB: 10.5 g/dL — ABNORMAL LOW (ref 13.0–17.1)
LYMPH%: 4.6 % — ABNORMAL LOW (ref 14.0–49.0)
MCH: 32.2 pg (ref 27.2–33.4)
MCHC: 33.9 g/dL (ref 32.0–36.0)
MCV: 95 fL (ref 79.3–98.0)
MONO#: 0.2 10*3/uL (ref 0.1–0.9)
MONO%: 3.6 % (ref 0.0–14.0)
NEUT#: 5.1 10*3/uL (ref 1.5–6.5)
NEUT%: 91.6 % — ABNORMAL HIGH (ref 39.0–75.0)
Platelets: 240 10*3/uL (ref 140–400)
RBC: 3.27 10*6/uL — ABNORMAL LOW (ref 4.20–5.82)
RDW: 20.1 % — ABNORMAL HIGH (ref 11.0–14.6)
WBC: 5.6 10*3/uL (ref 4.0–10.3)
lymph#: 0.3 10*3/uL — ABNORMAL LOW (ref 0.9–3.3)

## 2010-09-02 LAB — URIC ACID: Uric Acid, Serum: 5.1 mg/dL (ref 4.0–7.8)

## 2010-09-02 LAB — RETICULOCYTES (CHCC)
ABS Retic: 40 10*3/uL (ref 19.0–186.0)
RBC.: 3.33 MIL/uL — ABNORMAL LOW (ref 4.22–5.81)
Retic Ct Pct: 1.2 % (ref 0.4–3.1)

## 2010-09-02 LAB — IMMUNOFIXATION ADD-ON

## 2010-09-02 LAB — CHCC SMEAR

## 2010-09-06 LAB — IMMUNOFIXATION ELECTROPHORESIS
IgA: 10 mg/dL — ABNORMAL LOW (ref 68–378)
IgG (Immunoglobin G), Serum: 5570 mg/dL — ABNORMAL HIGH (ref 694–1618)
IgM, Serum: 17 mg/dL — ABNORMAL LOW (ref 60–263)
Total Protein, Serum Electrophoresis: 9.9 g/dL — ABNORMAL HIGH (ref 6.0–8.3)

## 2010-09-06 LAB — KAPPA/LAMBDA LIGHT CHAINS
Kappa free light chain: <0.66 mg/dL (ref 0.33–1.94)
Lambda Free Lght Chn: 17.7 mg/dL — ABNORMAL HIGH (ref 0.57–2.63)

## 2010-09-06 LAB — BETA 2 MICROGLOBULIN, SERUM: Beta-2 Microglobulin: 5.6 mg/L — ABNORMAL HIGH (ref 1.01–1.73)

## 2010-09-06 NOTE — Discharge Summary (Signed)
NAMEDESHANNON, HINCHLIFFE               ACCOUNT NO.:  [id number]  MEDICAL RECORD NO.:  [id number]          PATIENT TYPE:  IPS  LOCATION:  4003                         FACILITY:  [facility]  PHYSICIAN:  Ranelle Oyster, M.D.DATE OF BIRTH:  1955-09-26  DATE OF ADMISSION:  07/29/2010 DATE OF DISCHARGE:                              DISCHARGE SUMMARY   DISCHARGE DIAGNOSES: 1. 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 2. Subcutaneous Lovenox for deep vein thrombosis prophylaxis. 3. Pain management. 4. Postoperative ileus, resolved. 5. Neurogenic bowel and bladder.  This is a 55 year old black male with progressive mid back pain x2-3 months with progressive paraparesis.  MRI of thoracic lumbar spine on July 14, 2011, showed diffuse abnormal marrow space thoracic T8 with cord edema and encroachment upon the spinal cord.  Placed on steroid protocol.  He did receive two rounds of radiation therapy after biopsy showed myeloma.  Admitted to [facility] on July 17, 2010, with increased weakness and pain.  MRI showed increase in size of tumor. He underwent thoracotomy, thoracic T8 vertebrectomy T7-9 with cage placement as well as chest tube on July 18, 2010, per Dr. Venetia Maxon.  He remained on Decadron therapy.  Subcutaneous Lovenox for deep vein thrombosis prophylaxis, back brace when out of bed.  Hospital course complicated by ileus with nasogastric tube placed, later removed on July 26, 2010.  Diet slowly advanced.  Pain management advised. Neurogenic bowel and bladder.  Followup Oncology Services with planned radiation therapy.  He was total assist for mobility.  He was admitted for comprehensive rehab program.  PAST MEDICAL HISTORY:  See discharge diagnoses.  No alcohol or tobacco.  ALLERGIES:  None.  SOCIAL HISTORY:  He is married.  He builds ATM machines.  Lives in a  1- level home, one step to entry.  Wife is a Production designer, theatre/television/film at [employer].  They have 17 year old twins.  Wife plans to take family medical leave to assist.  Functional history prior to admission was independent.  Functional status upon admission to Rehab Services was +2 total assist bed mobility, total assist activities of daily living, maxi move for transfers.  MEDICATIONS PRIOR TO ADMISSION:  Aleve as needed.  PHYSICAL EXAMINATION:  VITAL SIGNS:  Blood pressure 163/81, pulse 65, temperature 98.4, respirations 18. GENERAL:  This was an alert male in no acute distress, oriented x3. EXTREMITIES:  Deep tendon reflexes decreased in lower extremities, thoracic T9-10 sensory level.  He had a TLSO brace in place. BACK:  Incision well healed. LUNGS:  Clear to auscultation. CARDIAC:  Regular rate and rhythm. ABDOMEN:  Soft, nontender.  Good bowel sounds.  REHABILITATION HOSPITAL COURSE:  The patient was admitted to Inpatient Rehab Services with therapies initiated on a 3-hour daily basis consisting of physical therapy, occupational therapy, and 24-hour rehabilitation nursing.  The following issues were addressed during the patient's rehabilitation stay.  Pertaining to Mr. Sardinha's myeloma, thoracic T8 with resection and thoracotomy on July 18, 2010, per Dr. Maeola Harman, noted paraplegia.  He  was ongoing with Oncology Services for radiation therapy.  This would be ongoing and addressed as per outpatient services as radiation therapy had recently been completed. He remained on subcutaneous Lovenox for deep vein thrombosis prophylaxis.  Venous Doppler studies negative.  He would remain on Lovenox for 12 weeks from the date of July 27, 2010, for prophylaxis. Pain management ongoing with adjustments as advised.  His OxyContin increased to sustained release of 40 mg every 12 hours on August 24, 2010.  He was tapered off his baclofen.  He continued on scheduled Robaxin 500 mg  every 12 hours, Decadron currently at 4 mg twice daily as advised.  He was using oxycodone immediate release for breakthrough pain.  Noted postoperative ileus resolved.  Diet steadily advanced.  No nausea or vomiting.  Noted neurogenic bowel and bladder with intermittent catheterizations every 6 hours.  The patient had undergone teaching in regard to independence for self catheterizations.  Bowel program was discussed with his wife.  He was wearing a back brace when out of bed at the discretion of Neurosurgery.  The patient received weekly collaborative interdisciplinary team conferences to discuss estimated length of stay, family teaching, and any barriers to discharge.  He was supervision lower body dressing and upper body dressing, modified independent grooming, minimal assist for bathing, moderate assist transfers, supervision wheelchair mobility.  Neurogenic bowel and bladder as discussed.  He did have Mepilex to the sacrum with routine turning to prevent skin breakdown.  All issues in regard to his care were discussed with the patient and wife and the amount of care the patient would need.  There were some initial concerns of possible skilled nursing facility would be needed; however, wife and the patient adamantly refused this.  They did undergo full family teaching which went rather well and plan will be to discharge to home on August 26, 2010. During his rehabilitation stay, he did receive follow up by Neuropsychology, Dr. Eula Flax, for coping due to a long hospital complicated course.  The patient appeared to be doing well with emotional support provided.  Latest labs showed hemoglobin 10, hematocrit 28, platelet 219,000. Sodium 131, potassium 4.2, BUN 13, creatinine 0.7.  Discharge medications at the time of dictation included: 1. Protonix 40 mg daily. 2. Subcutaneous Lovenox 30 mg every 12 hours for 12 weeks,starting     date was July 27, 2010. 3. Dulcolax  suppository daily. 4. Decadron 4 mg p.o. b.i.d. 5. Florastor 500 mg twice daily. 6. Robaxin 500 mg p.o. 4 times daily. 7. OxyContin sustained release 40 mg p.o. b.i.d. with taper to be     discussed. 8. Oxycodone immediate release 5 mg 1-2 tablets every 4 hours as     needed pain, dispense of 90 tablets.  His diet was regular.  SPECIAL INSTRUCTIONS:  Back brace when out of bed.  The patient followed with Dr. Maeola Harman, [phone], Neurosurgery, call for appointment; Dr. Pierce Crane, Oncology Service, in regard to ongoing treatment for myeloma; Dr. [name] at the outpatient rehab center, appointment to be made.  Home health therapies had been arranged as well as nursing.     Mariam Dollar, P.A.   ______________________________ Ranelle Oyster, M.D.    DA/MEDQ  D:  08/25/2010  T:  08/25/2010  Job:  [id number]  cc:   Danae Orleans. Venetia Maxon, M.D. Pierce Crane, MD  Electronically Signed by Mariam Dollar P.A. on 08/26/2010 02:58:23 PM Electronically Signed by [name] M.D. on 09/06/2010 04:34:18 PM

## 2010-09-06 NOTE — Discharge Summary (Signed)
  NAMEMAXEN, [name]               ACCOUNT NO.:  [id number]  MEDICAL RECORD NO.:  [id number]           PATIENT TYPE:  I  LOCATION:  4003                         FACILITY:  [facility]  PHYSICIAN:  Ranelle Oyster, M.D.DATE OF BIRTH:  20-Mar-1956  DATE OF ADMISSION:  07/29/2010 DATE OF DISCHARGE:  08/30/2010                              DISCHARGE SUMMARY   ADDENDUM  The patient initially scheduled for discharge on August 26, 2010; however, family teaching still needs to be completed, issues in regards to equipment were addressed, thus extending his discharge to August 30, 2010.  No medication changes were made.  He will continue on scheduled OxyContin sustained release until follow up with Dr. [name] as advised.  Ongoing plans to see Oncology Services in regards to his myeloma.  Lovenox would be ongoing until October 19, 2010, which he received full teaching in regards to administering this.  All issues in regards to home health therapies had been addressed with family as well as a family teaching with his wife.     Mariam Dollar, P.A.   ______________________________ Ranelle Oyster, M.D.    DA/MEDQ  D:  08/30/2010  T:  08/30/2010  Job:  [id number]  Electronically Signed by Mariam Dollar P.A. on 09/02/2010 07:43:17 AM Electronically Signed by [name] M.D. on 09/06/2010 04:34:21 PM

## 2010-09-08 NOTE — Progress Notes (Signed)
Summary: phn msg  Phone Note Call from Patient Call back at Home Phone [phone]   Caller: [facility] Summary of Call: pt is being d/c from hosp and pt needs to be on Lovenox.  it is too expensive and wants to know if there is a substitute. Initial call taken by: De Nurse,  August 30, 2010 2:05 PM  Follow-up for Phone Call        spoke with Harvel Ricks, PA (for Dr. Riley Kill) who cared for the pt during rehab admission.  he states that pt should have 3 days supply to get him through.  The case manager had said to Jesusita Oka  that it was all worked out in prior to d/c.   Arthor Captain will call tomorrow to speak with Annice Pih and make sure that everything is in place for pt to get lovenox.  Ellin Mayhew MD  August 31, 2010 5:15 PM

## 2010-09-10 ENCOUNTER — Emergency Department (HOSPITAL_COMMUNITY)
Admission: EM | Admit: 2010-09-10 | Discharge: 2010-09-10 | Disposition: A | Discharge status: Home / Self Care | Payer: Managed Care, Other (non HMO) | Attending: Emergency Medicine | Admitting: Emergency Medicine

## 2010-09-10 DIAGNOSIS — G8220 Paraplegia, unspecified: Secondary | ICD-10-CM | POA: Insufficient documentation

## 2010-09-10 DIAGNOSIS — R319 Hematuria, unspecified: Secondary | ICD-10-CM | POA: Insufficient documentation

## 2010-09-10 DIAGNOSIS — Z85820 Personal history of malignant melanoma of skin: Secondary | ICD-10-CM | POA: Insufficient documentation

## 2010-09-10 LAB — CBC
HCT: 27.1 % — ABNORMAL LOW (ref 39.0–52.0)
Hemoglobin: 9.6 g/dL — ABNORMAL LOW (ref 13.0–17.0)
MCH: 32.1 pg (ref 26.0–34.0)
MCHC: 35.4 g/dL (ref 30.0–36.0)
MCV: 90.6 fL (ref 78.0–100.0)
Platelets: 227 10*3/uL (ref 150–400)
RBC: 2.99 MIL/uL — ABNORMAL LOW (ref 4.22–5.81)
RDW: 18.5 % — ABNORMAL HIGH (ref 11.5–15.5)
WBC: 5.1 10*3/uL (ref 4.0–10.5)

## 2010-09-10 LAB — BASIC METABOLIC PANEL
BUN: 15 mg/dL (ref 6–23)
CO2: 26 mEq/L (ref 19–32)
Calcium: 9.2 mg/dL (ref 8.4–10.5)
Chloride: 96 mEq/L (ref 96–112)
Creatinine, Ser: 0.54 mg/dL (ref 0.4–1.5)
GFR calc Af Amer: >60 mL/min (ref >60)
GFR calc non Af Amer: >60 mL/min (ref >60)
Glucose, Bld: 105 mg/dL — ABNORMAL HIGH (ref 70–99)
Potassium: 4.4 mEq/L (ref 3.5–5.1)
Sodium: 131 mEq/L — ABNORMAL LOW (ref 135–145)

## 2010-09-10 LAB — PROTIME-INR
INR: 1.2 (ref 0.00–1.49)
Prothrombin Time: 15.4 seconds — ABNORMAL HIGH (ref 11.6–15.2)

## 2010-09-10 LAB — DIFFERENTIAL
Basophils Absolute: 0 10*3/uL (ref 0.0–0.1)
Basophils Relative: 1 % (ref 0–1)
Eosinophils Absolute: 0 10*3/uL (ref 0.0–0.7)
Eosinophils Relative: 0 % (ref 0–5)
Lymphocytes Relative: 6 % — ABNORMAL LOW (ref 12–46)
Lymphs Abs: 0.3 10*3/uL — ABNORMAL LOW (ref 0.7–4.0)
Monocytes Absolute: 0.2 10*3/uL (ref 0.1–1.0)
Monocytes Relative: 3 % (ref 3–12)
Neutro Abs: 4.6 10*3/uL (ref 1.7–7.7)
Neutrophils Relative %: 90 % — ABNORMAL HIGH (ref 43–77)

## 2010-09-10 LAB — URINALYSIS, ROUTINE W REFLEX MICROSCOPIC
Bilirubin Urine: NEGATIVE
Ketones, ur: NEGATIVE mg/dL
Nitrite: NEGATIVE
Protein, ur: 30 mg/dL — AB
Specific Gravity, Urine: 1.019 (ref 1.005–1.030)
Urine Glucose, Fasting: NEGATIVE mg/dL
Urobilinogen, UA: 1 mg/dL (ref 0.0–1.0)
pH: 7 (ref 5.0–8.0)

## 2010-09-10 LAB — URINE MICROSCOPIC-ADD ON

## 2010-09-10 LAB — APTT: aPTT: 30 seconds (ref 24–37)

## 2010-09-12 ENCOUNTER — Other Ambulatory Visit: Payer: Self-pay | Admitting: Oncology

## 2010-09-12 ENCOUNTER — Encounter (HOSPITAL_BASED_OUTPATIENT_CLINIC_OR_DEPARTMENT_OTHER): Payer: Managed Care, Other (non HMO) | Admitting: Oncology

## 2010-09-12 DIAGNOSIS — C7949 Secondary malignant neoplasm of other parts of nervous system: Secondary | ICD-10-CM

## 2010-09-12 DIAGNOSIS — C9000 Multiple myeloma not having achieved remission: Secondary | ICD-10-CM

## 2010-09-12 DIAGNOSIS — Z5112 Encounter for antineoplastic immunotherapy: Secondary | ICD-10-CM

## 2010-09-12 DIAGNOSIS — C7931 Secondary malignant neoplasm of brain: Secondary | ICD-10-CM

## 2010-09-12 LAB — CBC WITH DIFFERENTIAL/PLATELET
BASO%: 0.4 % (ref 0.0–2.0)
Basophils Absolute: 0 10*3/uL (ref 0.0–0.1)
EOS%: 0 % (ref 0.0–7.0)
Eosinophils Absolute: 0 10*3/uL (ref 0.0–0.5)
HCT: 31.9 % — ABNORMAL LOW (ref 38.4–49.9)
HGB: 11.4 g/dL — ABNORMAL LOW (ref 13.0–17.1)
LYMPH%: 6.5 % — ABNORMAL LOW (ref 14.0–49.0)
MCH: 31.3 pg (ref 27.2–33.4)
MCHC: 35.7 g/dL (ref 32.0–36.0)
MCV: 87.6 fL (ref 79.3–98.0)
MONO#: 0.1 10*3/uL (ref 0.1–0.9)
MONO%: 1.9 % (ref 0.0–14.0)
NEUT#: 6.2 10*3/uL (ref 1.5–6.5)
NEUT%: 91.2 % — ABNORMAL HIGH (ref 39.0–75.0)
Platelets: 258 10*3/uL (ref 140–400)
RBC: 3.64 10*6/uL — ABNORMAL LOW (ref 4.20–5.82)
RDW: 18.3 % — ABNORMAL HIGH (ref 11.0–14.6)
WBC: 6.8 10*3/uL (ref 4.0–10.3)
lymph#: 0.4 10*3/uL — ABNORMAL LOW (ref 0.9–3.3)
nRBC: 2 % — ABNORMAL HIGH (ref 0–0)

## 2010-09-12 LAB — COMPREHENSIVE METABOLIC PANEL
ALT: 111 U/L — ABNORMAL HIGH (ref 0–53)
AST: 93 U/L — ABNORMAL HIGH (ref 0–37)
Albumin: 2.2 g/dL — ABNORMAL LOW (ref 3.5–5.2)
Alkaline Phosphatase: 77 U/L (ref 39–117)
BUN: 23 mg/dL (ref 6–23)
CO2: 26 mEq/L (ref 19–32)
Calcium: 9.2 mg/dL (ref 8.4–10.5)
Chloride: 95 mEq/L — ABNORMAL LOW (ref 96–112)
Creatinine, Ser: 0.77 mg/dL (ref 0.40–1.50)
Glucose, Bld: 142 mg/dL — ABNORMAL HIGH (ref 70–99)
Potassium: 4.7 mEq/L (ref 3.5–5.3)
Sodium: 131 mEq/L — ABNORMAL LOW (ref 135–145)
Total Bilirubin: 0.6 mg/dL (ref 0.3–1.2)
Total Protein: 10.1 g/dL — ABNORMAL HIGH (ref 6.0–8.3)

## 2010-09-12 LAB — TECHNOLOGIST REVIEW

## 2010-09-19 ENCOUNTER — Other Ambulatory Visit: Payer: Self-pay | Admitting: Oncology

## 2010-09-19 ENCOUNTER — Encounter (HOSPITAL_BASED_OUTPATIENT_CLINIC_OR_DEPARTMENT_OTHER): Payer: Managed Care, Other (non HMO) | Admitting: Oncology

## 2010-09-19 ENCOUNTER — Ambulatory Visit (HOSPITAL_COMMUNITY)
Admission: RE | Admit: 2010-09-19 | Discharge: 2010-09-19 | Disposition: A | Discharge status: Home / Self Care | Payer: Managed Care, Other (non HMO) | Source: Ambulatory Visit | Attending: Neurosurgery | Admitting: Neurosurgery

## 2010-09-19 ENCOUNTER — Other Ambulatory Visit: Payer: Self-pay | Admitting: Physician Assistant

## 2010-09-19 ENCOUNTER — Other Ambulatory Visit (HOSPITAL_COMMUNITY): Payer: Self-pay | Admitting: Neurosurgery

## 2010-09-19 DIAGNOSIS — C7949 Secondary malignant neoplasm of other parts of nervous system: Secondary | ICD-10-CM

## 2010-09-19 DIAGNOSIS — R52 Pain, unspecified: Secondary | ICD-10-CM

## 2010-09-19 DIAGNOSIS — C9000 Multiple myeloma not having achieved remission: Secondary | ICD-10-CM

## 2010-09-19 DIAGNOSIS — Z5112 Encounter for antineoplastic immunotherapy: Secondary | ICD-10-CM

## 2010-09-19 DIAGNOSIS — Z85830 Personal history of malignant neoplasm of bone: Secondary | ICD-10-CM | POA: Insufficient documentation

## 2010-09-19 DIAGNOSIS — C7931 Secondary malignant neoplasm of brain: Secondary | ICD-10-CM

## 2010-09-19 DIAGNOSIS — M549 Dorsalgia, unspecified: Secondary | ICD-10-CM | POA: Insufficient documentation

## 2010-09-19 DIAGNOSIS — C3490 Malignant neoplasm of unspecified part of unspecified bronchus or lung: Secondary | ICD-10-CM | POA: Insufficient documentation

## 2010-09-19 LAB — CBC WITH DIFFERENTIAL/PLATELET
BASO%: 0.2 % (ref 0.0–2.0)
Basophils Absolute: 0 10*3/uL (ref 0.0–0.1)
EOS%: 0.2 % (ref 0.0–7.0)
Eosinophils Absolute: 0 10*3/uL (ref 0.0–0.5)
HCT: 29.3 % — ABNORMAL LOW (ref 38.4–49.9)
HGB: 10.4 g/dL — ABNORMAL LOW (ref 13.0–17.1)
LYMPH%: 6.4 % — ABNORMAL LOW (ref 14.0–49.0)
MCH: 31.8 pg (ref 27.2–33.4)
MCHC: 35.5 g/dL (ref 32.0–36.0)
MCV: 89.6 fL (ref 79.3–98.0)
MONO#: 0.1 10*3/uL (ref 0.1–0.9)
MONO%: 2.2 % (ref 0.0–14.0)
NEUT#: 5 10*3/uL (ref 1.5–6.5)
NEUT%: 91 % — ABNORMAL HIGH (ref 39.0–75.0)
Platelets: 220 10*3/uL (ref 140–400)
RBC: 3.27 10*6/uL — ABNORMAL LOW (ref 4.20–5.82)
RDW: 19.5 % — ABNORMAL HIGH (ref 11.0–14.6)
WBC: 5.4 10*3/uL (ref 4.0–10.3)
lymph#: 0.4 10*3/uL — ABNORMAL LOW (ref 0.9–3.3)

## 2010-09-19 LAB — COMPREHENSIVE METABOLIC PANEL
ALT: 70 U/L — ABNORMAL HIGH (ref 0–53)
AST: 33 U/L (ref 0–37)
Albumin: 2.4 g/dL — ABNORMAL LOW (ref 3.5–5.2)
Alkaline Phosphatase: 83 U/L (ref 39–117)
BUN: 9 mg/dL (ref 6–23)
CO2: 22 mEq/L (ref 19–32)
Calcium: 7.9 mg/dL — ABNORMAL LOW (ref 8.4–10.5)
Chloride: 104 mEq/L (ref 96–112)
Creatinine, Ser: 0.6 mg/dL (ref 0.40–1.50)
Glucose, Bld: 156 mg/dL — ABNORMAL HIGH (ref 70–99)
Potassium: 4.6 mEq/L (ref 3.5–5.3)
Sodium: 135 mEq/L (ref 135–145)
Total Bilirubin: 0.4 mg/dL (ref 0.3–1.2)
Total Protein: 8.2 g/dL (ref 6.0–8.3)

## 2010-09-19 LAB — WHOLE BLOOD GLUCOSE
Glucose: 166 mg/dL — ABNORMAL HIGH (ref 70–100)
HRS PC: 0 Hours

## 2010-09-22 ENCOUNTER — Ambulatory Visit: Payer: Managed Care, Other (non HMO) | Admitting: Radiation Oncology

## 2010-10-03 ENCOUNTER — Other Ambulatory Visit: Payer: Self-pay | Admitting: Physician Assistant

## 2010-10-03 ENCOUNTER — Encounter (HOSPITAL_BASED_OUTPATIENT_CLINIC_OR_DEPARTMENT_OTHER): Payer: Managed Care, Other (non HMO) | Admitting: Oncology

## 2010-10-03 DIAGNOSIS — C7931 Secondary malignant neoplasm of brain: Secondary | ICD-10-CM

## 2010-10-03 DIAGNOSIS — Z5112 Encounter for antineoplastic immunotherapy: Secondary | ICD-10-CM

## 2010-10-03 DIAGNOSIS — C7949 Secondary malignant neoplasm of other parts of nervous system: Secondary | ICD-10-CM

## 2010-10-03 DIAGNOSIS — C9000 Multiple myeloma not having achieved remission: Secondary | ICD-10-CM

## 2010-10-03 LAB — URINALYSIS, ROUTINE W REFLEX MICROSCOPIC
Bilirubin Urine: NEGATIVE
Glucose, UA: NEGATIVE mg/dL
Hgb urine dipstick: NEGATIVE
Ketones, ur: NEGATIVE mg/dL
Nitrite: NEGATIVE
Protein, ur: NEGATIVE mg/dL
Specific Gravity, Urine: 1.015 (ref 1.005–1.030)
Urobilinogen, UA: 1 mg/dL (ref 0.0–1.0)
pH: 6 (ref 5.0–8.0)

## 2010-10-03 LAB — CROSSMATCH
ABO/RH(D): O POS
Antibody Screen: NEGATIVE
Unit division: 0
Unit division: 0
Unit division: 0
Unit division: 0

## 2010-10-03 LAB — PROTEIN ELECTROPHORESIS, SERUM
Albumin ELP: 41.1 % — ABNORMAL LOW (ref 55.8–66.1)
Alpha-1-Globulin: 3.6 % (ref 2.9–4.9)
Alpha-2-Globulin: 9.4 % (ref 7.1–11.8)
Beta 2: 3.3 % (ref 3.2–6.5)
Beta Globulin: 4.5 % — ABNORMAL LOW (ref 4.7–7.2)
Gamma Globulin: 38.1 % — ABNORMAL HIGH (ref 11.1–18.8)
M-Spike, %: 3.28 g/dL
Total Protein ELP: 9.1 g/dL — ABNORMAL HIGH (ref 6.0–8.3)

## 2010-10-03 LAB — UIFE/LIGHT CHAINS/TP QN, 24-HR UR
Albumin, U: DETECTED
Albumin, U: DETECTED
Alpha 1, Urine: DETECTED — AB
Alpha 1, Urine: DETECTED — AB
Alpha 2, Urine: DETECTED — AB
Alpha 2, Urine: DETECTED — AB
Beta, Urine: DETECTED — AB
Beta, Urine: DETECTED — AB
Free Kappa Lt Chains,Ur: 3.34 mg/dL — ABNORMAL HIGH (ref 0.04–1.51)
Free Kappa Lt Chains,Ur: 1.7 mg/dL — ABNORMAL HIGH (ref 0.04–1.51)
Free Kappa/Lambda Ratio: 1.55 ratio (ref 0.46–4.00)
Free Lambda Excretion/Day: 46.76 mg/d
Free Lambda Lt Chains,Ur: 2.15 mg/dL — ABNORMAL HIGH (ref 0.08–1.01)
Free Lambda Lt Chains,Ur: 1.44 mg/dL — ABNORMAL HIGH (ref 0.08–1.01)
Free Lt Chn Excr Rate: 72.65 mg/d
Gamma Globulin, Urine: DETECTED — AB
Gamma Globulin, Urine: DETECTED — AB
Time: 24 hours
Total Protein, Urine-Ur/day: 318 mg/d — ABNORMAL HIGH (ref 10–140)
Total Protein, Urine: 14.6 mg/dL
Total Protein, Urine: 6.3 mg/dL
Volume, Urine: 2175 mL

## 2010-10-03 LAB — DIFFERENTIAL
Basophils Absolute: 0.1 10*3/uL (ref 0.0–0.1)
Basophils Absolute: 0.1 10*3/uL (ref 0.0–0.1)
Basophils Relative: 1 % (ref 0–1)
Basophils Relative: 1 % (ref 0–1)
Eosinophils Absolute: 0.2 10*3/uL (ref 0.0–0.7)
Eosinophils Absolute: 0.2 10*3/uL (ref 0.0–0.7)
Eosinophils Relative: 2 % (ref 0–5)
Eosinophils Relative: 2 % (ref 0–5)
Lymphocytes Relative: 26 % (ref 12–46)
Lymphocytes Relative: 27 % (ref 12–46)
Lymphs Abs: 2.3 10*3/uL (ref 0.7–4.0)
Lymphs Abs: 2.5 10*3/uL (ref 0.7–4.0)
Monocytes Absolute: 0.7 10*3/uL (ref 0.1–1.0)
Monocytes Absolute: 1 10*3/uL (ref 0.1–1.0)
Monocytes Relative: 11 % (ref 3–12)
Monocytes Relative: 8 % (ref 3–12)
Neutro Abs: 5.4 10*3/uL (ref 1.7–7.7)
Neutro Abs: 5.6 10*3/uL (ref 1.7–7.7)
Neutrophils Relative %: 59 % (ref 43–77)
Neutrophils Relative %: 63 % (ref 43–77)

## 2010-10-03 LAB — KAPPA/LAMBDA LIGHT CHAINS
Kappa free light chain: <0.66 mg/dL (ref 0.33–1.94)
Lambda free light chains: 8.33 mg/dL — ABNORMAL HIGH (ref 0.57–2.63)

## 2010-10-03 LAB — COMPREHENSIVE METABOLIC PANEL
ALT: 51 U/L (ref 0–53)
ALT: 52 U/L (ref 0–53)
ALT: 64 U/L — ABNORMAL HIGH (ref 0–53)
ALT: 55 U/L — ABNORMAL HIGH (ref 0–53)
AST: 33 U/L (ref 0–37)
AST: 35 U/L (ref 0–37)
AST: 38 U/L — ABNORMAL HIGH (ref 0–37)
AST: 42 U/L — ABNORMAL HIGH (ref 0–37)
Albumin: 3 g/dL — ABNORMAL LOW (ref 3.5–5.2)
Albumin: 3 g/dL — ABNORMAL LOW (ref 3.5–5.2)
Albumin: 3.1 g/dL — ABNORMAL LOW (ref 3.5–5.2)
Albumin: 3.3 g/dL — ABNORMAL LOW (ref 3.5–5.2)
Alkaline Phosphatase: 50 U/L (ref 39–117)
Alkaline Phosphatase: 53 U/L (ref 39–117)
Alkaline Phosphatase: 54 U/L (ref 39–117)
Alkaline Phosphatase: 61 U/L (ref 39–117)
BUN: 12 mg/dL (ref 6–23)
BUN: 15 mg/dL (ref 6–23)
BUN: 19 mg/dL (ref 6–23)
BUN: 12 mg/dL (ref 6–23)
CO2: 26 mEq/L (ref 19–32)
CO2: 27 mEq/L (ref 19–32)
CO2: 27 mEq/L (ref 19–32)
CO2: 27 mEq/L (ref 19–32)
Calcium: 9.1 mg/dL (ref 8.4–10.5)
Calcium: 9.4 mg/dL (ref 8.4–10.5)
Calcium: 9.6 mg/dL (ref 8.4–10.5)
Calcium: 9.4 mg/dL (ref 8.4–10.5)
Chloride: 102 mEq/L (ref 96–112)
Chloride: 104 mEq/L (ref 96–112)
Chloride: 104 mEq/L (ref 96–112)
Chloride: 103 mEq/L (ref 96–112)
Creatinine, Ser: 0.88 mg/dL (ref 0.4–1.5)
Creatinine, Ser: 0.92 mg/dL (ref 0.4–1.5)
Creatinine, Ser: 0.92 mg/dL (ref 0.4–1.5)
Creatinine, Ser: 0.89 mg/dL (ref 0.4–1.5)
GFR calc Af Amer: >60 mL/min (ref >60)
GFR calc Af Amer: >60 mL/min (ref >60)
GFR calc Af Amer: >60 mL/min (ref >60)
GFR calc Af Amer: >60 mL/min (ref >60)
GFR calc non Af Amer: >60 mL/min (ref >60)
GFR calc non Af Amer: >60 mL/min (ref >60)
GFR calc non Af Amer: >60 mL/min (ref >60)
GFR calc non Af Amer: >60 mL/min (ref >60)
Glucose, Bld: 132 mg/dL — ABNORMAL HIGH (ref 70–99)
Glucose, Bld: 135 mg/dL — ABNORMAL HIGH (ref 70–99)
Glucose, Bld: 144 mg/dL — ABNORMAL HIGH (ref 70–99)
Glucose, Bld: 81 mg/dL (ref 70–99)
Potassium: 3.8 mEq/L (ref 3.5–5.1)
Potassium: 4.3 mEq/L (ref 3.5–5.1)
Potassium: 4.3 mEq/L (ref 3.5–5.1)
Potassium: 3.9 mEq/L (ref 3.5–5.1)
Sodium: 137 mEq/L (ref 135–145)
Sodium: 137 mEq/L (ref 135–145)
Sodium: 138 mEq/L (ref 135–145)
Sodium: 137 mEq/L (ref 135–145)
Total Bilirubin: 0.5 mg/dL (ref 0.3–1.2)
Total Bilirubin: 0.6 mg/dL (ref 0.3–1.2)
Total Bilirubin: 0.6 mg/dL (ref 0.3–1.2)
Total Bilirubin: 0.4 mg/dL (ref 0.3–1.2)
Total Protein: 8.7 g/dL — ABNORMAL HIGH (ref 6.0–8.3)
Total Protein: 8.7 g/dL — ABNORMAL HIGH (ref 6.0–8.3)
Total Protein: 8.8 g/dL — ABNORMAL HIGH (ref 6.0–8.3)
Total Protein: 8.4 g/dL — ABNORMAL HIGH (ref 6.0–8.3)

## 2010-10-03 LAB — CBC
HCT: 28.8 % — ABNORMAL LOW (ref 39.0–52.0)
HCT: 29.9 % — ABNORMAL LOW (ref 39.0–52.0)
HCT: 31.4 % — ABNORMAL LOW (ref 39.0–52.0)
HCT: 30.1 % — ABNORMAL LOW (ref 39.0–52.0)
HCT: 31.1 % — ABNORMAL LOW (ref 39.0–52.0)
HCT: 35.8 % — ABNORMAL LOW (ref 39.0–52.0)
Hemoglobin: 10.2 g/dL — ABNORMAL LOW (ref 13.0–17.0)
Hemoglobin: 10.6 g/dL — ABNORMAL LOW (ref 13.0–17.0)
Hemoglobin: 11.2 g/dL — ABNORMAL LOW (ref 13.0–17.0)
Hemoglobin: 10.7 g/dL — ABNORMAL LOW (ref 13.0–17.0)
Hemoglobin: 11.3 g/dL — ABNORMAL LOW (ref 13.0–17.0)
Hemoglobin: 12.9 g/dL — ABNORMAL LOW (ref 13.0–17.0)
MCH: 29.9 pg (ref 26.0–34.0)
MCH: 30.2 pg (ref 26.0–34.0)
MCH: 30.4 pg (ref 26.0–34.0)
MCH: 30.5 pg (ref 26.0–34.0)
MCH: 30.5 pg (ref 26.0–34.0)
MCH: 30.6 pg (ref 26.0–34.0)
MCHC: 35.4 g/dL (ref 30.0–36.0)
MCHC: 35.5 g/dL (ref 30.0–36.0)
MCHC: 35.7 g/dL (ref 30.0–36.0)
MCHC: 35.5 g/dL (ref 30.0–36.0)
MCHC: 36 g/dL (ref 30.0–36.0)
MCHC: 36.3 g/dL — ABNORMAL HIGH (ref 30.0–36.0)
MCV: 84.5 fL (ref 78.0–100.0)
MCV: 85.2 fL (ref 78.0–100.0)
MCV: 85.3 fL (ref 78.0–100.0)
MCV: 83.8 fL (ref 78.0–100.0)
MCV: 84.6 fL (ref 78.0–100.0)
MCV: 86 fL (ref 78.0–100.0)
Platelets: 222 10*3/uL (ref 150–400)
Platelets: 231 10*3/uL (ref 150–400)
Platelets: 242 10*3/uL (ref 150–400)
Platelets: 197 10*3/uL (ref 150–400)
Platelets: 241 10*3/uL (ref 150–400)
Platelets: 266 10*3/uL (ref 150–400)
RBC: 3.38 MIL/uL — ABNORMAL LOW (ref 4.22–5.81)
RBC: 3.54 MIL/uL — ABNORMAL LOW (ref 4.22–5.81)
RBC: 3.68 MIL/uL — ABNORMAL LOW (ref 4.22–5.81)
RBC: 3.5 MIL/uL — ABNORMAL LOW (ref 4.22–5.81)
RBC: 3.71 MIL/uL — ABNORMAL LOW (ref 4.22–5.81)
RBC: 4.23 MIL/uL (ref 4.22–5.81)
RDW: 16.3 % — ABNORMAL HIGH (ref 11.5–15.5)
RDW: 16.4 % — ABNORMAL HIGH (ref 11.5–15.5)
RDW: 16.5 % — ABNORMAL HIGH (ref 11.5–15.5)
RDW: 15.9 % — ABNORMAL HIGH (ref 11.5–15.5)
RDW: 16.4 % — ABNORMAL HIGH (ref 11.5–15.5)
RDW: 16.6 % — ABNORMAL HIGH (ref 11.5–15.5)
WBC: 11.6 10*3/uL — ABNORMAL HIGH (ref 4.0–10.5)
WBC: 11.7 10*3/uL — ABNORMAL HIGH (ref 4.0–10.5)
WBC: 15.1 10*3/uL — ABNORMAL HIGH (ref 4.0–10.5)
WBC: 19.3 10*3/uL — ABNORMAL HIGH (ref 4.0–10.5)
WBC: 8.9 10*3/uL (ref 4.0–10.5)
WBC: 9.1 10*3/uL (ref 4.0–10.5)

## 2010-10-03 LAB — BASIC METABOLIC PANEL
BUN: 13 mg/dL (ref 6–23)
CO2: 28 mEq/L (ref 19–32)
Calcium: 9.5 mg/dL (ref 8.4–10.5)
Chloride: 100 mEq/L (ref 96–112)
Creatinine, Ser: 0.88 mg/dL (ref 0.4–1.5)
GFR calc Af Amer: >60 mL/min (ref >60)
GFR calc non Af Amer: >60 mL/min (ref >60)
Glucose, Bld: 110 mg/dL — ABNORMAL HIGH (ref 70–99)
Potassium: 4.1 mEq/L (ref 3.5–5.1)
Sodium: 135 mEq/L (ref 135–145)

## 2010-10-03 LAB — GLUCOSE, CAPILLARY
Glucose-Capillary: 116 mg/dL — ABNORMAL HIGH (ref 70–99)
Glucose-Capillary: 119 mg/dL — ABNORMAL HIGH (ref 70–99)
Glucose-Capillary: 132 mg/dL — ABNORMAL HIGH (ref 70–99)
Glucose-Capillary: 139 mg/dL — ABNORMAL HIGH (ref 70–99)
Glucose-Capillary: 155 mg/dL — ABNORMAL HIGH (ref 70–99)
Glucose-Capillary: 164 mg/dL — ABNORMAL HIGH (ref 70–99)
Glucose-Capillary: 177 mg/dL — ABNORMAL HIGH (ref 70–99)
Glucose-Capillary: 106 mg/dL — ABNORMAL HIGH (ref 70–99)
Glucose-Capillary: 112 mg/dL — ABNORMAL HIGH (ref 70–99)
Glucose-Capillary: 112 mg/dL — ABNORMAL HIGH (ref 70–99)
Glucose-Capillary: 114 mg/dL — ABNORMAL HIGH (ref 70–99)
Glucose-Capillary: 115 mg/dL — ABNORMAL HIGH (ref 70–99)
Glucose-Capillary: 115 mg/dL — ABNORMAL HIGH (ref 70–99)
Glucose-Capillary: 115 mg/dL — ABNORMAL HIGH (ref 70–99)
Glucose-Capillary: 117 mg/dL — ABNORMAL HIGH (ref 70–99)
Glucose-Capillary: 120 mg/dL — ABNORMAL HIGH (ref 70–99)
Glucose-Capillary: 122 mg/dL — ABNORMAL HIGH (ref 70–99)
Glucose-Capillary: 122 mg/dL — ABNORMAL HIGH (ref 70–99)
Glucose-Capillary: 122 mg/dL — ABNORMAL HIGH (ref 70–99)
Glucose-Capillary: 124 mg/dL — ABNORMAL HIGH (ref 70–99)
Glucose-Capillary: 126 mg/dL — ABNORMAL HIGH (ref 70–99)
Glucose-Capillary: 127 mg/dL — ABNORMAL HIGH (ref 70–99)
Glucose-Capillary: 129 mg/dL — ABNORMAL HIGH (ref 70–99)
Glucose-Capillary: 130 mg/dL — ABNORMAL HIGH (ref 70–99)
Glucose-Capillary: 130 mg/dL — ABNORMAL HIGH (ref 70–99)
Glucose-Capillary: 131 mg/dL — ABNORMAL HIGH (ref 70–99)
Glucose-Capillary: 132 mg/dL — ABNORMAL HIGH (ref 70–99)
Glucose-Capillary: 134 mg/dL — ABNORMAL HIGH (ref 70–99)
Glucose-Capillary: 137 mg/dL — ABNORMAL HIGH (ref 70–99)
Glucose-Capillary: 137 mg/dL — ABNORMAL HIGH (ref 70–99)
Glucose-Capillary: 141 mg/dL — ABNORMAL HIGH (ref 70–99)
Glucose-Capillary: 142 mg/dL — ABNORMAL HIGH (ref 70–99)
Glucose-Capillary: 143 mg/dL — ABNORMAL HIGH (ref 70–99)
Glucose-Capillary: 145 mg/dL — ABNORMAL HIGH (ref 70–99)
Glucose-Capillary: 145 mg/dL — ABNORMAL HIGH (ref 70–99)
Glucose-Capillary: 148 mg/dL — ABNORMAL HIGH (ref 70–99)
Glucose-Capillary: 153 mg/dL — ABNORMAL HIGH (ref 70–99)
Glucose-Capillary: 156 mg/dL — ABNORMAL HIGH (ref 70–99)
Glucose-Capillary: 156 mg/dL — ABNORMAL HIGH (ref 70–99)
Glucose-Capillary: 163 mg/dL — ABNORMAL HIGH (ref 70–99)
Glucose-Capillary: 177 mg/dL — ABNORMAL HIGH (ref 70–99)
Glucose-Capillary: 90 mg/dL (ref 70–99)
Glucose-Capillary: 94 mg/dL (ref 70–99)
Glucose-Capillary: 96 mg/dL (ref 70–99)

## 2010-10-03 LAB — POCT I-STAT 4, (NA,K, GLUC, HGB,HCT)
Glucose, Bld: 163 mg/dL — ABNORMAL HIGH (ref 70–99)
Glucose, Bld: 165 mg/dL — ABNORMAL HIGH (ref 70–99)
Glucose, Bld: 165 mg/dL — ABNORMAL HIGH (ref 70–99)
HCT: 24 % — ABNORMAL LOW (ref 39.0–52.0)
HCT: 31 % — ABNORMAL LOW (ref 39.0–52.0)
HCT: 31 % — ABNORMAL LOW (ref 39.0–52.0)
Hemoglobin: 10.5 g/dL — ABNORMAL LOW (ref 13.0–17.0)
Hemoglobin: 10.5 g/dL — ABNORMAL LOW (ref 13.0–17.0)
Hemoglobin: 8.2 g/dL — ABNORMAL LOW (ref 13.0–17.0)
Potassium: 3.9 mEq/L (ref 3.5–5.1)
Potassium: 4.1 mEq/L (ref 3.5–5.1)
Potassium: 4.1 mEq/L (ref 3.5–5.1)
Sodium: 137 mEq/L (ref 135–145)
Sodium: 137 mEq/L (ref 135–145)
Sodium: 138 mEq/L (ref 135–145)

## 2010-10-03 LAB — RETICULOCYTES
RBC.: 3.88 MIL/uL — ABNORMAL LOW (ref 4.22–5.81)
Retic Count, Absolute: 27.2 10*3/uL (ref 19.0–186.0)
Retic Ct Pct: 0.7 % (ref 0.4–3.1)

## 2010-10-03 LAB — CBC WITH DIFFERENTIAL/PLATELET
BASO%: 0.1 % (ref 0.0–2.0)
Basophils Absolute: 0 10*3/uL (ref 0.0–0.1)
EOS%: 0.4 % (ref 0.0–7.0)
Eosinophils Absolute: 0 10*3/uL (ref 0.0–0.5)
HCT: 32 % — ABNORMAL LOW (ref 38.4–49.9)
HGB: 11 g/dL — ABNORMAL LOW (ref 13.0–17.1)
LYMPH%: 8.5 % — ABNORMAL LOW (ref 14.0–49.0)
MCH: 31.1 pg (ref 27.2–33.4)
MCHC: 34.4 g/dL (ref 32.0–36.0)
MCV: 90.4 fL (ref 79.3–98.0)
MONO#: 0.8 10*3/uL (ref 0.1–0.9)
MONO%: 8.9 % (ref 0.0–14.0)
NEUT#: 7.5 10*3/uL — ABNORMAL HIGH (ref 1.5–6.5)
NEUT%: 82.1 % — ABNORMAL HIGH (ref 39.0–75.0)
Platelets: 338 10*3/uL (ref 140–400)
RBC: 3.54 10*6/uL — ABNORMAL LOW (ref 4.20–5.82)
RDW: 19.7 % — ABNORMAL HIGH (ref 11.0–14.6)
WBC: 9.1 10*3/uL (ref 4.0–10.3)
lymph#: 0.8 10*3/uL — ABNORMAL LOW (ref 0.9–3.3)

## 2010-10-03 LAB — MRSA PCR SCREENING: MRSA by PCR: NEGATIVE

## 2010-10-03 LAB — C-REACTIVE PROTEIN: CRP: 1 mg/dL — ABNORMAL HIGH (ref <0.6)

## 2010-10-03 LAB — IMMUNOFIXATION ELECTROPHORESIS
IgA: 15 mg/dL — ABNORMAL LOW (ref 68–378)
IgG (Immunoglobin G), Serum: 4450 mg/dL — ABNORMAL HIGH (ref 694–1618)
IgM, Serum: 15 mg/dL — ABNORMAL LOW (ref 60–263)
Total Protein ELP: 8.8 g/dL — ABNORMAL HIGH (ref 6.0–8.3)

## 2010-10-03 LAB — LACTATE DEHYDROGENASE
LDH: 161 U/L (ref 94–250)
LDH: 162 U/L (ref 94–250)
LDH: 189 U/L (ref 94–250)

## 2010-10-03 LAB — ABO/RH: ABO/RH(D): O POS

## 2010-10-03 LAB — IGG, IGA, IGM
IgA: 15 mg/dL — ABNORMAL LOW (ref 68–378)
IgG (Immunoglobin G), Serum: 4460 mg/dL — ABNORMAL HIGH (ref 694–1618)
IgM, Serum: 12 mg/dL — ABNORMAL LOW (ref 60–263)

## 2010-10-03 LAB — SEDIMENTATION RATE: Sed Rate: 95 mm/hr — ABNORMAL HIGH (ref 0–16)

## 2010-10-03 LAB — OCCULT BLOOD, POC DEVICE: Fecal Occult Bld: NEGATIVE

## 2010-10-03 LAB — PROTIME-INR
INR: 1.22 (ref 0.00–1.49)
Prothrombin Time: 15.6 seconds — ABNORMAL HIGH (ref 11.6–15.2)

## 2010-10-03 LAB — BETA 2 MICROGLOBULIN, SERUM: Beta-2 Microglobulin: 4.82 mg/L — ABNORMAL HIGH (ref 1.01–1.73)

## 2010-10-03 LAB — TECHNOLOGIST SMEAR REVIEW

## 2010-10-03 LAB — APTT: aPTT: 30 seconds (ref 24–37)

## 2010-10-04 ENCOUNTER — Encounter: Payer: Medicaid Other | Attending: Physical Medicine & Rehabilitation

## 2010-10-04 ENCOUNTER — Inpatient Hospital Stay (HOSPITAL_BASED_OUTPATIENT_CLINIC_OR_DEPARTMENT_OTHER): Payer: Managed Care, Other (non HMO) | Admitting: Physical Medicine & Rehabilitation

## 2010-10-04 DIAGNOSIS — N319 Neuromuscular dysfunction of bladder, unspecified: Secondary | ICD-10-CM | POA: Insufficient documentation

## 2010-10-04 DIAGNOSIS — C9000 Multiple myeloma not having achieved remission: Secondary | ICD-10-CM

## 2010-10-04 DIAGNOSIS — M549 Dorsalgia, unspecified: Secondary | ICD-10-CM | POA: Insufficient documentation

## 2010-10-04 DIAGNOSIS — M79609 Pain in unspecified limb: Secondary | ICD-10-CM | POA: Insufficient documentation

## 2010-10-04 DIAGNOSIS — G8220 Paraplegia, unspecified: Secondary | ICD-10-CM

## 2010-10-04 DIAGNOSIS — K921 Melena: Secondary | ICD-10-CM | POA: Insufficient documentation

## 2010-10-04 DIAGNOSIS — Z7901 Long term (current) use of anticoagulants: Secondary | ICD-10-CM | POA: Insufficient documentation

## 2010-10-04 DIAGNOSIS — R259 Unspecified abnormal involuntary movements: Secondary | ICD-10-CM | POA: Insufficient documentation

## 2010-10-04 DIAGNOSIS — K592 Neurogenic bowel, not elsewhere classified: Secondary | ICD-10-CM

## 2010-10-04 DIAGNOSIS — G5620 Lesion of ulnar nerve, unspecified upper limb: Secondary | ICD-10-CM

## 2010-10-05 LAB — IMMUNOFIXATION ELECTROPHORESIS
IgA: 32 mg/dL — ABNORMAL LOW (ref 68–378)
IgG (Immunoglobin G), Serum: 2370 mg/dL — ABNORMAL HIGH (ref 694–1618)
IgM, Serum: 72 mg/dL (ref 60–263)
Total Protein, Serum Electrophoresis: 7.2 g/dL (ref 6.0–8.3)

## 2010-10-05 LAB — COMPREHENSIVE METABOLIC PANEL
ALT: 44 U/L (ref 0–53)
AST: 17 U/L (ref 0–37)
Albumin: 3.3 g/dL — ABNORMAL LOW (ref 3.5–5.2)
Alkaline Phosphatase: 84 U/L (ref 39–117)
BUN: 13 mg/dL (ref 6–23)
CO2: 23 mEq/L (ref 19–32)
Calcium: 8.7 mg/dL (ref 8.4–10.5)
Chloride: 102 mEq/L (ref 96–112)
Creatinine, Ser: 0.44 mg/dL (ref 0.40–1.50)
Glucose, Bld: 124 mg/dL — ABNORMAL HIGH (ref 70–99)
Potassium: 4 mEq/L (ref 3.5–5.3)
Sodium: 137 mEq/L (ref 135–145)
Total Bilirubin: 0.3 mg/dL (ref 0.3–1.2)
Total Protein: 7.2 g/dL (ref 6.0–8.3)

## 2010-10-10 ENCOUNTER — Other Ambulatory Visit: Payer: Self-pay | Admitting: Physician Assistant

## 2010-10-10 ENCOUNTER — Encounter (HOSPITAL_BASED_OUTPATIENT_CLINIC_OR_DEPARTMENT_OTHER): Payer: Managed Care, Other (non HMO) | Admitting: Oncology

## 2010-10-10 DIAGNOSIS — C7931 Secondary malignant neoplasm of brain: Secondary | ICD-10-CM

## 2010-10-10 DIAGNOSIS — Z5112 Encounter for antineoplastic immunotherapy: Secondary | ICD-10-CM

## 2010-10-10 DIAGNOSIS — C7949 Secondary malignant neoplasm of other parts of nervous system: Secondary | ICD-10-CM

## 2010-10-10 DIAGNOSIS — C9000 Multiple myeloma not having achieved remission: Secondary | ICD-10-CM

## 2010-10-10 LAB — CBC WITH DIFFERENTIAL/PLATELET
BASO%: 0.2 % (ref 0.0–2.0)
Basophils Absolute: 0 10*3/uL (ref 0.0–0.1)
EOS%: 0.7 % (ref 0.0–7.0)
Eosinophils Absolute: 0.1 10*3/uL (ref 0.0–0.5)
HCT: 33.2 % — ABNORMAL LOW (ref 38.4–49.9)
HGB: 11.6 g/dL — ABNORMAL LOW (ref 13.0–17.1)
LYMPH%: 6.3 % — ABNORMAL LOW (ref 14.0–49.0)
MCH: 31.8 pg (ref 27.2–33.4)
MCHC: 34.9 g/dL (ref 32.0–36.0)
MCV: 91 fL (ref 79.3–98.0)
MONO#: 0.6 10*3/uL (ref 0.1–0.9)
MONO%: 6.2 % (ref 0.0–14.0)
NEUT#: 8.5 10*3/uL — ABNORMAL HIGH (ref 1.5–6.5)
NEUT%: 86.6 % — ABNORMAL HIGH (ref 39.0–75.0)
Platelets: 187 10*3/uL (ref 140–400)
RBC: 3.65 10*6/uL — ABNORMAL LOW (ref 4.20–5.82)
RDW: 18.9 % — ABNORMAL HIGH (ref 11.0–14.6)
WBC: 9.8 10*3/uL (ref 4.0–10.3)
lymph#: 0.6 10*3/uL — ABNORMAL LOW (ref 0.9–3.3)
nRBC: 0 % (ref 0–0)

## 2010-10-10 LAB — COMPREHENSIVE METABOLIC PANEL
ALT: 67 U/L — ABNORMAL HIGH (ref 0–53)
AST: 22 U/L (ref 0–37)
Albumin: 3.4 g/dL — ABNORMAL LOW (ref 3.5–5.2)
Alkaline Phosphatase: 92 U/L (ref 39–117)
BUN: 12 mg/dL (ref 6–23)
CO2: 21 mEq/L (ref 19–32)
Calcium: 8.6 mg/dL (ref 8.4–10.5)
Chloride: 102 mEq/L (ref 96–112)
Creatinine, Ser: 0.66 mg/dL (ref 0.40–1.50)
Glucose, Bld: 154 mg/dL — ABNORMAL HIGH (ref 70–99)
Potassium: 4.1 mEq/L (ref 3.5–5.3)
Sodium: 139 mEq/L (ref 135–145)
Total Bilirubin: 0.4 mg/dL (ref 0.3–1.2)
Total Protein: 7.1 g/dL (ref 6.0–8.3)

## 2010-10-13 ENCOUNTER — Ambulatory Visit (INDEPENDENT_AMBULATORY_CARE_PROVIDER_SITE_OTHER): Payer: Managed Care, Other (non HMO) | Admitting: Family Medicine

## 2010-10-13 ENCOUNTER — Encounter: Payer: Self-pay | Admitting: Family Medicine

## 2010-10-13 DIAGNOSIS — G834 Cauda equina syndrome: Secondary | ICD-10-CM

## 2010-10-13 DIAGNOSIS — C9000 Multiple myeloma not having achieved remission: Secondary | ICD-10-CM

## 2010-10-13 DIAGNOSIS — K592 Neurogenic bowel, not elsewhere classified: Secondary | ICD-10-CM

## 2010-10-14 ENCOUNTER — Other Ambulatory Visit: Payer: Self-pay | Admitting: Physician Assistant

## 2010-10-14 ENCOUNTER — Encounter (HOSPITAL_BASED_OUTPATIENT_CLINIC_OR_DEPARTMENT_OTHER): Payer: Managed Care, Other (non HMO) | Admitting: Oncology

## 2010-10-14 ENCOUNTER — Encounter: Payer: Self-pay | Admitting: Family Medicine

## 2010-10-14 DIAGNOSIS — Z5112 Encounter for antineoplastic immunotherapy: Secondary | ICD-10-CM

## 2010-10-14 DIAGNOSIS — K592 Neurogenic bowel, not elsewhere classified: Secondary | ICD-10-CM | POA: Insufficient documentation

## 2010-10-14 DIAGNOSIS — C9001 Multiple myeloma in remission: Secondary | ICD-10-CM | POA: Insufficient documentation

## 2010-10-14 DIAGNOSIS — C9000 Multiple myeloma not having achieved remission: Secondary | ICD-10-CM | POA: Insufficient documentation

## 2010-10-14 DIAGNOSIS — C9002 Multiple myeloma in relapse: Secondary | ICD-10-CM | POA: Insufficient documentation

## 2010-10-14 LAB — PROTIME-INR
INR: 1.8 — ABNORMAL LOW (ref 2.00–3.50)
Protime: 21.6 Seconds — ABNORMAL HIGH (ref 10.6–13.4)

## 2010-10-14 NOTE — Patient Instructions (Signed)
Follow up as scheduled with PM and R and oncology.  Return to me as needed.  Will defer on health maintence items until current myeloma treatments are completed.

## 2010-10-14 NOTE — Assessment & Plan Note (Signed)
For neurogenic bladder pt self cath q 6 hours, also has bowel regimen. No problems with consipation per wife.

## 2010-10-14 NOTE — Progress Notes (Signed)
  Subjective:    Patient ID: [name], male    DOB: [birth year], 55 y.o.   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  Also had an ileus during admission and also has had some problems with neurogenic bowel and bladder.    PMH: No other pmh except for above  SH: no drugs, alcohol, or cigarettes  Meds: did not bring medication list, doesn't remember current medications- states that they will bring list later this week for me to put into chart.  (medications on med rec are meds from outside external pharmacy communication with epic).   No complaints at time of evaluation, just states that he wanted to see his new pcp since he hasn't been back into [facility] since diagnosis of above issues.    Per wife pt has been off and on blood thinners "ran out of lovenox because it was too expensive" and states that his first dose of coumandin was 1 week ago.  His INR is managed  By Dr. Donnie Coffin- oncology.   Coumadin is for DVT prophylaxis- at time of d/c had recommended 12 weeks of lovenox in d/c summary. But it appears that pt has now been switched to coumadin.  Venous dopplar studies as inpatient were negative.  I am unsure how long pt will be on coumadin.  I will request records from Dr. Donnie Coffin- oncology to get current treatment plan. And current med list.   For neurogenic bladder pt self cath q 6 hours, also has bowel regimen. No problems with consipation per wife.    Review of Systems    negative except per above Objective:   Physical Exam  Constitutional: He is oriented to person, place, and time. He appears well-developed.       Pt in wheelchair, wearing full torso brace.  Eyes: Pupils are equal, round, and reactive to light.  Cardiovascular: Normal rate and regular rhythm.  Exam reveals no gallop and no  friction rub.   No murmur heard. Pulmonary/Chest: Effort normal and breath sounds normal. No respiratory distress. He has no wheezes.  Abdominal:       Unable to examine 2/2 torso brace.  Musculoskeletal: He exhibits no edema.       No movement of lower extremities. Equal movement and strength of upper extremities.   Neurological: He is alert and oriented to person, place, and time.       No sensation to touch below umbilicus.  + sensation above umbilicus.   Skin: Skin is warm and dry.  Psychiatric: His behavior is normal. Judgment and thought content normal.          Assessment & Plan:

## 2010-10-14 NOTE — Assessment & Plan Note (Signed)
On home regimen for bowel and use digital stim- per wife no problems with constipation currently.

## 2010-10-14 NOTE — Assessment & Plan Note (Signed)
Records requested from Dr. Renelda Loma office to get current treatment regimen.   Per pt he is on chemo- and is unsure how long he will need treatments.

## 2010-10-17 ENCOUNTER — Encounter (HOSPITAL_BASED_OUTPATIENT_CLINIC_OR_DEPARTMENT_OTHER): Payer: Managed Care, Other (non HMO) | Admitting: Oncology

## 2010-10-17 ENCOUNTER — Other Ambulatory Visit: Payer: Self-pay | Admitting: Physician Assistant

## 2010-10-17 DIAGNOSIS — C7949 Secondary malignant neoplasm of other parts of nervous system: Secondary | ICD-10-CM

## 2010-10-17 DIAGNOSIS — Z5112 Encounter for antineoplastic immunotherapy: Secondary | ICD-10-CM

## 2010-10-17 DIAGNOSIS — C9000 Multiple myeloma not having achieved remission: Secondary | ICD-10-CM

## 2010-10-17 DIAGNOSIS — C7931 Secondary malignant neoplasm of brain: Secondary | ICD-10-CM

## 2010-10-17 LAB — CBC WITH DIFFERENTIAL/PLATELET
BASO%: 0.1 % (ref 0.0–2.0)
Basophils Absolute: 0 10*3/uL (ref 0.0–0.1)
EOS%: 0.6 % (ref 0.0–7.0)
Eosinophils Absolute: 0 10*3/uL (ref 0.0–0.5)
HCT: 31.2 % — ABNORMAL LOW (ref 38.4–49.9)
HGB: 10.9 g/dL — ABNORMAL LOW (ref 13.0–17.1)
LYMPH%: 8.6 % — ABNORMAL LOW (ref 14.0–49.0)
MCH: 31.4 pg (ref 27.2–33.4)
MCHC: 34.9 g/dL (ref 32.0–36.0)
MCV: 89.9 fL (ref 79.3–98.0)
MONO#: 0.5 10*3/uL (ref 0.1–0.9)
MONO%: 7.4 % (ref 0.0–14.0)
NEUT#: 5.8 10*3/uL (ref 1.5–6.5)
NEUT%: 83.3 % — ABNORMAL HIGH (ref 39.0–75.0)
Platelets: 232 10*3/uL (ref 140–400)
RBC: 3.47 10*6/uL — ABNORMAL LOW (ref 4.20–5.82)
RDW: 18.6 % — ABNORMAL HIGH (ref 11.0–14.6)
WBC: 7 10*3/uL (ref 4.0–10.3)
lymph#: 0.6 10*3/uL — ABNORMAL LOW (ref 0.9–3.3)
nRBC: 0 % (ref 0–0)

## 2010-10-17 LAB — PROTIME-INR
INR: 2.1 (ref 2.00–3.50)
Protime: 25.2 Seconds — ABNORMAL HIGH (ref 10.6–13.4)

## 2010-10-27 ENCOUNTER — Ambulatory Visit (HOSPITAL_COMMUNITY)
Admission: RE | Admit: 2010-10-27 | Discharge: 2010-10-27 | Disposition: A | Discharge status: Home / Self Care | Payer: Medicare (Managed Care) | Source: Ambulatory Visit | Attending: Neurosurgery | Admitting: Neurosurgery

## 2010-10-27 ENCOUNTER — Other Ambulatory Visit (HOSPITAL_COMMUNITY): Payer: Self-pay | Admitting: Neurosurgery

## 2010-10-27 ENCOUNTER — Ambulatory Visit: Payer: PRIVATE HEALTH INSURANCE | Attending: Radiation Oncology | Admitting: Radiation Oncology

## 2010-10-27 DIAGNOSIS — Z09 Encounter for follow-up examination after completed treatment for conditions other than malignant neoplasm: Secondary | ICD-10-CM | POA: Insufficient documentation

## 2010-10-27 DIAGNOSIS — R52 Pain, unspecified: Secondary | ICD-10-CM

## 2010-10-27 DIAGNOSIS — Z87898 Personal history of other specified conditions: Secondary | ICD-10-CM | POA: Insufficient documentation

## 2010-10-27 DIAGNOSIS — G8220 Paraplegia, unspecified: Secondary | ICD-10-CM | POA: Insufficient documentation

## 2010-10-31 ENCOUNTER — Other Ambulatory Visit: Payer: Self-pay | Admitting: Physician Assistant

## 2010-10-31 ENCOUNTER — Encounter (HOSPITAL_BASED_OUTPATIENT_CLINIC_OR_DEPARTMENT_OTHER): Payer: Managed Care, Other (non HMO) | Admitting: Oncology

## 2010-10-31 DIAGNOSIS — C9000 Multiple myeloma not having achieved remission: Secondary | ICD-10-CM

## 2010-10-31 DIAGNOSIS — Z5112 Encounter for antineoplastic immunotherapy: Secondary | ICD-10-CM

## 2010-10-31 LAB — CBC WITH DIFFERENTIAL/PLATELET
BASO%: 0.2 % (ref 0.0–2.0)
Basophils Absolute: 0 10*3/uL (ref 0.0–0.1)
EOS%: 1.1 % (ref 0.0–7.0)
Eosinophils Absolute: 0.1 10*3/uL (ref 0.0–0.5)
HCT: 30.6 % — ABNORMAL LOW (ref 38.4–49.9)
HGB: 10.5 g/dL — ABNORMAL LOW (ref 13.0–17.1)
LYMPH%: 11 % — ABNORMAL LOW (ref 14.0–49.0)
MCH: 30.6 pg (ref 27.2–33.4)
MCHC: 34.3 g/dL (ref 32.0–36.0)
MCV: 89.2 fL (ref 79.3–98.0)
MONO#: 0.3 10*3/uL (ref 0.1–0.9)
MONO%: 4.7 % (ref 0.0–14.0)
NEUT#: 5.3 10*3/uL (ref 1.5–6.5)
NEUT%: 83 % — ABNORMAL HIGH (ref 39.0–75.0)
Platelets: 255 10*3/uL (ref 140–400)
RBC: 3.43 10*6/uL — ABNORMAL LOW (ref 4.20–5.82)
RDW: 17.8 % — ABNORMAL HIGH (ref 11.0–14.6)
WBC: 6.4 10*3/uL (ref 4.0–10.3)
lymph#: 0.7 10*3/uL — ABNORMAL LOW (ref 0.9–3.3)
nRBC: 0 % (ref 0–0)

## 2010-10-31 LAB — COMPREHENSIVE METABOLIC PANEL
ALT: 36 U/L (ref 0–53)
AST: 25 U/L (ref 0–37)
Albumin: 2.7 g/dL — ABNORMAL LOW (ref 3.5–5.2)
Alkaline Phosphatase: 96 U/L (ref 39–117)
BUN: 5 mg/dL — ABNORMAL LOW (ref 6–23)
CO2: 26 mEq/L (ref 19–32)
Calcium: 8.3 mg/dL — ABNORMAL LOW (ref 8.4–10.5)
Chloride: 103 mEq/L (ref 96–112)
Creatinine, Ser: 0.54 mg/dL (ref 0.40–1.50)
Glucose, Bld: 130 mg/dL — ABNORMAL HIGH (ref 70–99)
Potassium: 3.4 mEq/L — ABNORMAL LOW (ref 3.5–5.3)
Sodium: 137 mEq/L (ref 135–145)
Total Bilirubin: 0.5 mg/dL (ref 0.3–1.2)
Total Protein: 6.8 g/dL (ref 6.0–8.3)

## 2010-10-31 LAB — PROTIME-INR
INR: 2.4 (ref 2.00–3.50)
Protime: 28.8 Seconds — ABNORMAL HIGH (ref 10.6–13.4)

## 2010-11-01 LAB — IGG, IGA, IGM
IgA: 42 mg/dL — ABNORMAL LOW (ref 68–378)
IgG (Immunoglobin G), Serum: 1400 mg/dL (ref 694–1618)
IgM, Serum: 29 mg/dL — ABNORMAL LOW (ref 60–263)

## 2010-11-01 LAB — BETA 2 MICROGLOBULIN, SERUM: Beta-2 Microglobulin: 2.17 mg/L — ABNORMAL HIGH (ref 1.01–1.73)

## 2010-11-01 LAB — KAPPA/LAMBDA LIGHT CHAINS
Kappa free light chain: <0.6 mg/dL (ref 0.33–1.94)
Lambda Free Lght Chn: 0.54 mg/dL — ABNORMAL LOW (ref 0.57–2.63)

## 2010-11-07 ENCOUNTER — Encounter (HOSPITAL_COMMUNITY): Payer: PRIVATE HEALTH INSURANCE

## 2010-11-07 ENCOUNTER — Inpatient Hospital Stay (HOSPITAL_COMMUNITY)
Admission: AD | Admit: 2010-11-07 | Discharge: 2010-11-16 | DRG: 253 | Disposition: A | Discharge status: Home / Self Care | Payer: PRIVATE HEALTH INSURANCE | Source: Ambulatory Visit | Attending: Oncology | Admitting: Oncology

## 2010-11-07 ENCOUNTER — Other Ambulatory Visit: Payer: Self-pay | Admitting: Physician Assistant

## 2010-11-07 ENCOUNTER — Ambulatory Visit (HOSPITAL_COMMUNITY)
Admission: RE | Admit: 2010-11-07 | Discharge: 2010-11-07 | Disposition: A | Discharge status: Home / Self Care | Payer: PRIVATE HEALTH INSURANCE | Source: Ambulatory Visit | Attending: Oncology | Admitting: Oncology

## 2010-11-07 ENCOUNTER — Encounter (HOSPITAL_BASED_OUTPATIENT_CLINIC_OR_DEPARTMENT_OTHER): Payer: Managed Care, Other (non HMO) | Admitting: Oncology

## 2010-11-07 DIAGNOSIS — I10 Essential (primary) hypertension: Secondary | ICD-10-CM | POA: Diagnosis present

## 2010-11-07 DIAGNOSIS — Z7901 Long term (current) use of anticoagulants: Secondary | ICD-10-CM

## 2010-11-07 DIAGNOSIS — M7989 Other specified soft tissue disorders: Secondary | ICD-10-CM

## 2010-11-07 DIAGNOSIS — Z923 Personal history of irradiation: Secondary | ICD-10-CM

## 2010-11-07 DIAGNOSIS — R197 Diarrhea, unspecified: Secondary | ICD-10-CM | POA: Diagnosis not present

## 2010-11-07 DIAGNOSIS — I82409 Acute embolism and thrombosis of unspecified deep veins of unspecified lower extremity: Principal | ICD-10-CM | POA: Diagnosis present

## 2010-11-07 DIAGNOSIS — D649 Anemia, unspecified: Secondary | ICD-10-CM | POA: Diagnosis not present

## 2010-11-07 DIAGNOSIS — Z9221 Personal history of antineoplastic chemotherapy: Secondary | ICD-10-CM

## 2010-11-07 DIAGNOSIS — G8310 Monoplegia of lower limb affecting unspecified side: Secondary | ICD-10-CM | POA: Diagnosis present

## 2010-11-07 DIAGNOSIS — E876 Hypokalemia: Secondary | ICD-10-CM | POA: Diagnosis present

## 2010-11-07 DIAGNOSIS — Z5112 Encounter for antineoplastic immunotherapy: Secondary | ICD-10-CM

## 2010-11-07 DIAGNOSIS — C9000 Multiple myeloma not having achieved remission: Secondary | ICD-10-CM | POA: Diagnosis present

## 2010-11-07 LAB — CBC WITH DIFFERENTIAL/PLATELET
BASO%: 0.1 % (ref 0.0–2.0)
Basophils Absolute: 0 10*3/uL (ref 0.0–0.1)
EOS%: 0.1 % (ref 0.0–7.0)
Eosinophils Absolute: 0 10*3/uL (ref 0.0–0.5)
HCT: 27.1 % — ABNORMAL LOW (ref 38.4–49.9)
HGB: 9.2 g/dL — ABNORMAL LOW (ref 13.0–17.1)
LYMPH%: 2.7 % — ABNORMAL LOW (ref 14.0–49.0)
MCH: 29.3 pg (ref 27.2–33.4)
MCHC: 33.9 g/dL (ref 32.0–36.0)
MCV: 86.3 fL (ref 79.3–98.0)
MONO#: 0.5 10*3/uL (ref 0.1–0.9)
MONO%: 4.2 % (ref 0.0–14.0)
NEUT#: 11.9 10*3/uL — ABNORMAL HIGH (ref 1.5–6.5)
NEUT%: 92.9 % — ABNORMAL HIGH (ref 39.0–75.0)
Platelets: 152 10*3/uL (ref 140–400)
RBC: 3.14 10*6/uL — ABNORMAL LOW (ref 4.20–5.82)
RDW: 18.6 % — ABNORMAL HIGH (ref 11.0–14.6)
WBC: 12.8 10*3/uL — ABNORMAL HIGH (ref 4.0–10.3)
lymph#: 0.4 10*3/uL — ABNORMAL LOW (ref 0.9–3.3)

## 2010-11-07 LAB — CBC
HCT: 28.3 % — ABNORMAL LOW (ref 39.0–52.0)
Hemoglobin: 9.7 g/dL — ABNORMAL LOW (ref 13.0–17.0)
MCH: 29.6 pg (ref 26.0–34.0)
MCHC: 34.3 g/dL (ref 30.0–36.0)
MCV: 86.3 fL (ref 78.0–100.0)
Platelets: 157 10*3/uL (ref 150–400)
RBC: 3.28 MIL/uL — ABNORMAL LOW (ref 4.22–5.81)
RDW: 18.3 % — ABNORMAL HIGH (ref 11.5–15.5)
WBC: 13.1 10*3/uL — ABNORMAL HIGH (ref 4.0–10.5)

## 2010-11-07 LAB — PROTIME-INR
INR: 2.6 (ref 2.00–3.50)
INR: 2.7 — ABNORMAL HIGH (ref 0.00–1.49)
Prothrombin Time: 28.8 seconds — ABNORMAL HIGH (ref 11.6–15.2)
Protime: 31.2 Seconds — ABNORMAL HIGH (ref 10.6–13.4)

## 2010-11-07 LAB — APTT: aPTT: 53 seconds — ABNORMAL HIGH (ref 24–37)

## 2010-11-08 DIAGNOSIS — C9000 Multiple myeloma not having achieved remission: Secondary | ICD-10-CM

## 2010-11-08 LAB — HEPARIN LEVEL (UNFRACTIONATED)
Heparin Unfractionated: 0.19 IU/mL — ABNORMAL LOW (ref 0.30–0.70)
Heparin Unfractionated: 0.25 IU/mL — ABNORMAL LOW (ref 0.30–0.70)
Heparin Unfractionated: 0.41 IU/mL (ref 0.30–0.70)

## 2010-11-09 LAB — BASIC METABOLIC PANEL
BUN: 6 mg/dL (ref 6–23)
CO2: 24 mEq/L (ref 19–32)
Calcium: 7.6 mg/dL — ABNORMAL LOW (ref 8.4–10.5)
Chloride: 107 mEq/L (ref 96–112)
Creatinine, Ser: 0.46 mg/dL (ref 0.4–1.5)
GFR calc Af Amer: >60 mL/min (ref >60)
GFR calc non Af Amer: >60 mL/min (ref >60)
Glucose, Bld: 130 mg/dL — ABNORMAL HIGH (ref 70–99)
Potassium: 3.6 mEq/L (ref 3.5–5.1)
Sodium: 137 mEq/L (ref 135–145)

## 2010-11-09 LAB — HEPARIN LEVEL (UNFRACTIONATED): Heparin Unfractionated: 0.45 IU/mL (ref 0.30–0.70)

## 2010-11-10 ENCOUNTER — Inpatient Hospital Stay (HOSPITAL_COMMUNITY): Payer: PRIVATE HEALTH INSURANCE

## 2010-11-10 LAB — CBC
HCT: 25.9 % — ABNORMAL LOW (ref 39.0–52.0)
Hemoglobin: 8.7 g/dL — ABNORMAL LOW (ref 13.0–17.0)
MCH: 29.1 pg (ref 26.0–34.0)
MCHC: 33.6 g/dL (ref 30.0–36.0)
MCV: 86.6 fL (ref 78.0–100.0)
Platelets: 312 10*3/uL (ref 150–400)
RBC: 2.99 MIL/uL — ABNORMAL LOW (ref 4.22–5.81)
RDW: 18.7 % — ABNORMAL HIGH (ref 11.5–15.5)
WBC: 10 10*3/uL (ref 4.0–10.5)

## 2010-11-10 LAB — HEPARIN LEVEL (UNFRACTIONATED)
Heparin Unfractionated: 0.49 [IU]/mL (ref 0.30–0.70)
Heparin Unfractionated: <0.1 [IU]/mL — ABNORMAL LOW (ref 0.30–0.70)
Heparin Unfractionated: 0.34 [IU]/mL (ref 0.30–0.70)

## 2010-11-10 LAB — BASIC METABOLIC PANEL WITH GFR
BUN: 4 mg/dL — ABNORMAL LOW (ref 6–23)
CO2: 24 meq/L (ref 19–32)
Calcium: 7.5 mg/dL — ABNORMAL LOW (ref 8.4–10.5)
Chloride: 106 meq/L (ref 96–112)
Creatinine, Ser: 0.46 mg/dL (ref 0.4–1.5)
GFR calc Af Amer: >60 mL/min (ref >60)
GFR calc non Af Amer: >60 mL/min (ref >60)
Glucose, Bld: 97 mg/dL (ref 70–99)
Potassium: 3.7 meq/L (ref 3.5–5.1)
Sodium: 138 meq/L (ref 135–145)

## 2010-11-10 LAB — PROTIME-INR
INR: 2.04 — ABNORMAL HIGH (ref 0.00–1.49)
Prothrombin Time: 23.2 s — ABNORMAL HIGH (ref 11.6–15.2)

## 2010-11-10 LAB — APTT: aPTT: 188 s — ABNORMAL HIGH (ref 24–37)

## 2010-11-10 MED ORDER — IOHEXOL 300 MG/ML  SOLN
100.0000 mL | Freq: Once | INTRAMUSCULAR | Status: AC | PRN
  Administered 2010-11-10: 37.5 mL via INTRAVENOUS

## 2010-11-11 ENCOUNTER — Inpatient Hospital Stay (HOSPITAL_COMMUNITY): Payer: PRIVATE HEALTH INSURANCE

## 2010-11-11 LAB — CBC
HCT: 22.5 % — ABNORMAL LOW (ref 39.0–52.0)
Hemoglobin: 7.8 g/dL — ABNORMAL LOW (ref 13.0–17.0)
MCH: 30 pg (ref 26.0–34.0)
MCHC: 34.7 g/dL (ref 30.0–36.0)
MCV: 86.5 fL (ref 78.0–100.0)
Platelets: 365 10*3/uL (ref 150–400)
RBC: 2.6 MIL/uL — ABNORMAL LOW (ref 4.22–5.81)
RDW: 19 % — ABNORMAL HIGH (ref 11.5–15.5)
WBC: 7.5 10*3/uL (ref 4.0–10.5)

## 2010-11-11 LAB — BASIC METABOLIC PANEL
BUN: 3 mg/dL — ABNORMAL LOW (ref 6–23)
CO2: 22 mEq/L (ref 19–32)
Calcium: 7.1 mg/dL — ABNORMAL LOW (ref 8.4–10.5)
Chloride: 110 mEq/L (ref 96–112)
Creatinine, Ser: 0.39 mg/dL — ABNORMAL LOW (ref 0.4–1.5)
GFR calc Af Amer: >60 mL/min (ref >60)
GFR calc non Af Amer: >60 mL/min (ref >60)
Glucose, Bld: 89 mg/dL (ref 70–99)
Potassium: 2.9 mEq/L — ABNORMAL LOW (ref 3.5–5.1)
Sodium: 139 mEq/L (ref 135–145)

## 2010-11-11 LAB — ABO/RH: ABO/RH(D): O POS

## 2010-11-11 LAB — PROTIME-INR
INR: 1.77 — ABNORMAL HIGH (ref 0.00–1.49)
Prothrombin Time: 20.8 seconds — ABNORMAL HIGH (ref 11.6–15.2)

## 2010-11-11 LAB — HEPARIN LEVEL (UNFRACTIONATED)
Heparin Unfractionated: 0.35 IU/mL (ref 0.30–0.70)
Heparin Unfractionated: 0.42 IU/mL (ref 0.30–0.70)
Heparin Unfractionated: 0.44 IU/mL (ref 0.30–0.70)

## 2010-11-12 DIAGNOSIS — C9000 Multiple myeloma not having achieved remission: Secondary | ICD-10-CM

## 2010-11-12 LAB — CBC
HCT: 30.4 % — ABNORMAL LOW (ref 39.0–52.0)
Hemoglobin: 10.3 g/dL — ABNORMAL LOW (ref 13.0–17.0)
MCH: 29 pg (ref 26.0–34.0)
MCHC: 33.9 g/dL (ref 30.0–36.0)
MCV: 85.6 fL (ref 78.0–100.0)
Platelets: 418 10*3/uL — ABNORMAL HIGH (ref 150–400)
RBC: 3.55 MIL/uL — ABNORMAL LOW (ref 4.22–5.81)
RDW: 18 % — ABNORMAL HIGH (ref 11.5–15.5)
WBC: 8.8 10*3/uL (ref 4.0–10.5)

## 2010-11-12 LAB — COMPREHENSIVE METABOLIC PANEL
ALT: 145 U/L — ABNORMAL HIGH (ref 0–53)
AST: 49 U/L — ABNORMAL HIGH (ref 0–37)
Albumin: 1.8 g/dL — ABNORMAL LOW (ref 3.5–5.2)
Alkaline Phosphatase: 298 U/L — ABNORMAL HIGH (ref 39–117)
BUN: 2 mg/dL — ABNORMAL LOW (ref 6–23)
CO2: 22 mEq/L (ref 19–32)
Calcium: 7.8 mg/dL — ABNORMAL LOW (ref 8.4–10.5)
Chloride: 108 mEq/L (ref 96–112)
Creatinine, Ser: 0.42 mg/dL (ref 0.4–1.5)
GFR calc Af Amer: >60 mL/min (ref >60)
GFR calc non Af Amer: >60 mL/min (ref >60)
Glucose, Bld: 96 mg/dL (ref 70–99)
Potassium: 3.2 mEq/L — ABNORMAL LOW (ref 3.5–5.1)
Sodium: 139 mEq/L (ref 135–145)
Total Bilirubin: 1 mg/dL (ref 0.3–1.2)
Total Protein: 5.9 g/dL — ABNORMAL LOW (ref 6.0–8.3)

## 2010-11-12 LAB — HEPARIN LEVEL (UNFRACTIONATED): Heparin Unfractionated: 0.4 IU/mL (ref 0.30–0.70)

## 2010-11-12 LAB — PROTIME-INR
INR: 1.66 — ABNORMAL HIGH (ref 0.00–1.49)
Prothrombin Time: 19.8 seconds — ABNORMAL HIGH (ref 11.6–15.2)

## 2010-11-13 DIAGNOSIS — C9000 Multiple myeloma not having achieved remission: Secondary | ICD-10-CM

## 2010-11-13 LAB — CBC
HCT: 30 % — ABNORMAL LOW (ref 39.0–52.0)
Hemoglobin: 10 g/dL — ABNORMAL LOW (ref 13.0–17.0)
MCH: 29.2 pg (ref 26.0–34.0)
MCHC: 33.3 g/dL (ref 30.0–36.0)
MCV: 87.5 fL (ref 78.0–100.0)
Platelets: 420 10*3/uL — ABNORMAL HIGH (ref 150–400)
RBC: 3.43 MIL/uL — ABNORMAL LOW (ref 4.22–5.81)
RDW: 18.3 % — ABNORMAL HIGH (ref 11.5–15.5)
WBC: 9.6 10*3/uL (ref 4.0–10.5)

## 2010-11-13 LAB — DIFFERENTIAL
Basophils Absolute: 0 10*3/uL (ref 0.0–0.1)
Basophils Relative: 0 % (ref 0–1)
Eosinophils Absolute: 0 10*3/uL (ref 0.0–0.7)
Eosinophils Relative: 0 % (ref 0–5)
Lymphocytes Relative: 9 % — ABNORMAL LOW (ref 12–46)
Lymphs Abs: 0.9 10*3/uL (ref 0.7–4.0)
Monocytes Absolute: 0.9 10*3/uL (ref 0.1–1.0)
Monocytes Relative: 9 % (ref 3–12)
Neutro Abs: 7.8 10*3/uL — ABNORMAL HIGH (ref 1.7–7.7)
Neutrophils Relative %: 81 % — ABNORMAL HIGH (ref 43–77)

## 2010-11-13 LAB — HEPARIN LEVEL (UNFRACTIONATED): Heparin Unfractionated: 0.17 IU/mL — ABNORMAL LOW (ref 0.30–0.70)

## 2010-11-13 LAB — CROSSMATCH
ABO/RH(D): O POS
Antibody Screen: NEGATIVE
Unit division: 0
Unit division: 0

## 2010-11-13 LAB — PROTIME-INR
INR: 1.53 — ABNORMAL HIGH (ref 0.00–1.49)
Prothrombin Time: 18.6 seconds — ABNORMAL HIGH (ref 11.6–15.2)

## 2010-11-13 LAB — BASIC METABOLIC PANEL
BUN: 5 mg/dL — ABNORMAL LOW (ref 6–23)
CO2: 20 mEq/L (ref 19–32)
Calcium: 7.8 mg/dL — ABNORMAL LOW (ref 8.4–10.5)
Chloride: 111 mEq/L (ref 96–112)
Creatinine, Ser: 0.63 mg/dL (ref 0.4–1.5)
GFR calc Af Amer: >60 mL/min (ref >60)
GFR calc non Af Amer: >60 mL/min (ref >60)
Glucose, Bld: 149 mg/dL — ABNORMAL HIGH (ref 70–99)
Potassium: 3.8 mEq/L (ref 3.5–5.1)
Sodium: 138 mEq/L (ref 135–145)

## 2010-11-14 DIAGNOSIS — C9000 Multiple myeloma not having achieved remission: Secondary | ICD-10-CM

## 2010-11-14 DIAGNOSIS — I82409 Acute embolism and thrombosis of unspecified deep veins of unspecified lower extremity: Secondary | ICD-10-CM

## 2010-11-14 DIAGNOSIS — G839 Paralytic syndrome, unspecified: Secondary | ICD-10-CM

## 2010-11-14 LAB — PROTIME-INR
INR: 1.87 — ABNORMAL HIGH (ref 0.00–1.49)
Prothrombin Time: 21.7 seconds — ABNORMAL HIGH (ref 11.6–15.2)

## 2010-11-14 LAB — CBC
HCT: 29.9 % — ABNORMAL LOW (ref 39.0–52.0)
Hemoglobin: 9.9 g/dL — ABNORMAL LOW (ref 13.0–17.0)
MCH: 28.6 pg (ref 26.0–34.0)
MCHC: 33.1 g/dL (ref 30.0–36.0)
MCV: 86.4 fL (ref 78.0–100.0)
Platelets: 415 10*3/uL — ABNORMAL HIGH (ref 150–400)
RBC: 3.46 MIL/uL — ABNORMAL LOW (ref 4.22–5.81)
RDW: 18.3 % — ABNORMAL HIGH (ref 11.5–15.5)
WBC: 7.2 10*3/uL (ref 4.0–10.5)

## 2010-11-15 DIAGNOSIS — C9000 Multiple myeloma not having achieved remission: Secondary | ICD-10-CM

## 2010-11-15 LAB — PROTIME-INR
INR: 2.17 — ABNORMAL HIGH (ref 0.00–1.49)
Prothrombin Time: 24.3 seconds — ABNORMAL HIGH (ref 11.6–15.2)

## 2010-11-15 LAB — COMPREHENSIVE METABOLIC PANEL
ALT: 100 U/L — ABNORMAL HIGH (ref 0–53)
AST: 53 U/L — ABNORMAL HIGH (ref 0–37)
Albumin: 1.7 g/dL — ABNORMAL LOW (ref 3.5–5.2)
Alkaline Phosphatase: 256 U/L — ABNORMAL HIGH (ref 39–117)
BUN: 5 mg/dL — ABNORMAL LOW (ref 6–23)
CO2: 21 mEq/L (ref 19–32)
Calcium: 7.8 mg/dL — ABNORMAL LOW (ref 8.4–10.5)
Chloride: 112 mEq/L (ref 96–112)
Creatinine, Ser: 0.48 mg/dL (ref 0.4–1.5)
GFR calc Af Amer: >60 mL/min (ref >60)
GFR calc non Af Amer: >60 mL/min (ref >60)
Glucose, Bld: 98 mg/dL (ref 70–99)
Potassium: 3.8 mEq/L (ref 3.5–5.1)
Sodium: 139 mEq/L (ref 135–145)
Total Bilirubin: 0.9 mg/dL (ref 0.3–1.2)
Total Protein: 5.7 g/dL — ABNORMAL LOW (ref 6.0–8.3)

## 2010-11-15 LAB — CBC
HCT: 28.5 % — ABNORMAL LOW (ref 39.0–52.0)
Hemoglobin: 9.5 g/dL — ABNORMAL LOW (ref 13.0–17.0)
MCH: 29 pg (ref 26.0–34.0)
MCHC: 33.3 g/dL (ref 30.0–36.0)
MCV: 86.9 fL (ref 78.0–100.0)
Platelets: 378 10*3/uL (ref 150–400)
RBC: 3.28 MIL/uL — ABNORMAL LOW (ref 4.22–5.81)
RDW: 18 % — ABNORMAL HIGH (ref 11.5–15.5)
WBC: 8.7 10*3/uL (ref 4.0–10.5)

## 2010-11-15 LAB — DIFFERENTIAL
Basophils Absolute: 0 10*3/uL (ref 0.0–0.1)
Basophils Relative: 0 % (ref 0–1)
Eosinophils Absolute: 0.1 10*3/uL (ref 0.0–0.7)
Eosinophils Relative: 1 % (ref 0–5)
Lymphocytes Relative: 10 % — ABNORMAL LOW (ref 12–46)
Lymphs Abs: 0.8 10*3/uL (ref 0.7–4.0)
Monocytes Absolute: 0.8 10*3/uL (ref 0.1–1.0)
Monocytes Relative: 9 % (ref 3–12)
Neutro Abs: 6.9 10*3/uL (ref 1.7–7.7)
Neutrophils Relative %: 80 % — ABNORMAL HIGH (ref 43–77)

## 2010-11-15 LAB — HEPARIN LEVEL (UNFRACTIONATED): Heparin Unfractionated: 0.47 IU/mL (ref 0.30–0.70)

## 2010-11-15 LAB — CLOSTRIDIUM DIFFICILE BY PCR: Toxigenic C. Difficile by PCR: NEGATIVE

## 2010-11-15 NOTE — H&P (Signed)
NAMELUMAN, HOLWAY               ACCOUNT NO.:  [id number]  MEDICAL RECORD NO.:  [id number]           PATIENT TYPE:  I  LOCATION:  1338                         FACILITY:  [facility]  PHYSICIAN:  Pierce Crane, M.D., F.R.C.P.C.DATE OF BIRTH:  [birth year]  DATE OF ADMISSION:  11/07/2010 DATE OF DISCHARGE:                             HISTORY & PHYSICAL   SUBJECTIVE:  HISTORY OF PRESENT ILLNESS:  Mr. Talent is a well-known 55 year old [place] gentleman with IgG multiple myeloma, diagnosed in December 2011 after presentation with progressive back pain and subsequent hospitalization.  MRI of the T and L spine revealed abnormality T8 with 10% height loss, tumor extension, and long encroachment upon the spinal cord with marked impression of the cul-de- sac with cord edema.  Surgical decompression was performed by Dr. Venetia Maxon, radiation was completed between July 14, 2010, - August 22, 2010. He has been receiving Velcade weekly, 3 weeks on, 1 week off in the subcutaneous fashion.  He was due for week #2, cycle 3 today.  He has also been on oral Revlimid 15 mg per day, initiate on September 21, 2010, oral Decadron daily at 2 mg, pulsing to 20 mg on Velcade days alone.  He has been on Coumadin prophylaxis specifically 5 mg per day with an INR of 2.6 reported today.  Upon assessment today, though, he was noted to have increased left lower extremity edema as compared to the right, though he does have compression garments on both legs appropriately.  He denied any shortness of breath, no hemoptysis, no unusual chest pain. He was not wearing his back brace, stating that it had "broken," and he is also on a new wheelchair today.  Overall, he had been feeling fairly well.  Subsequent duplex Doppler of bilateral lower extremities were obtained, which showed findings consistent with a deep vein thrombosis involving the right lower extremity, specifically the right profunda, right  gastrocnemius, and the right posterior tibial.  On the left, left common femoral, femoral, popliteal, popliteal-tibial veins were all noncompressible.  Given these findings and the fact that he has a therapeutic INR, this case was discussed with Dr. Drue Second who is covering for Dr. Pierce Crane in his absence.  Given these findings, it was felt prudent for hospitalization for fractionated heparin drip to be does by pharmacology and for consideration of possible Greenfield filter placement.  PAST MEDICAL HISTORY:  As per HPI to include hypertension.  PAST SURGICAL HISTORY:  As per HPI to include left heel spur repair.  ALLERGIES:  No known drug allergies.  MEDICATIONS AT THE TIME OF ADMISSION: 1. Robaxin 500 mg p.o. t.i.d. p.r.n. back spasm. 2. Oxycodone 5-10 mg p.o. q.3-4 h. p.r.n. for pain. 3. Protonix 40 mg p.o. daily. 4. Macrodantin 100 mg p.o. b.i.d. 5. Dulcolax 10 mg nightly, given rectally.  REVIEW OF SYSTEMS:  As per HPI, otherwise noncontributory.  SOCIAL HISTORY:  Mother is deceased, history of colon cancer.  Father is alive with glaucoma and diabetes.  He has siblings in good health.  SOCIAL HISTORY:  Mr. [place]ber[place] with his wife, she was not present  with him today.  He has 2 twin daughters, ages 8.  He has never smoked.  No alcohol history.  He was previously in the [employer].  OBJECTIVE:  PHYSICAL EXAMINATION:  VITAL SIGNS:  Blood pressure is 106/72, pulse 82, respirations 20, temperature 99.0, weight was not obtained. HEENT:  Conjunctivae pink.  Sclerae anicteric.  Oropharynx is benign without oral mucositis or candidiasis. LUNGS:  Clear to auscultation. HEART:  Regular rate and rhythm without murmurs, rubs, gallops, or clicks. ABDOMEN:  Distant bowel sounds. EXTREMITIES:  Left lower extremity, greater diameter as compared to the right. 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  LABORATORY DATA:  At the time  of admission, hemoglobin 9.2 g, platelet count 152,000, WBC 12,800 with an ANC of 11,900 again PT/INR reported 31.2/2.6.  ASSESSMENT/PLAN:  A 55 year old [name] with, 1. Bilateral deep venous thromboses, left fairly extensive as compared     to right, despite therapeutic INR on Coumadin 2.5 mg per day     equalling INR of 2.6, given as Revlimid prophylaxis in a patient     with IgG multiple myeloma. 2. 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  Also, on monthly Zometa.  Again, case has been reviewed with Dr. Welton Flakes and Dr. Renelda Loma absence plan admission as above for fractionated heparinization, consideration of Greenfield filter placement.     Sharyl Nimrod, PA   ______________________________ Pierce Crane, M.D., F.R.C.P.C.    CS/MEDQ  D:  11/07/2010  T:  11/08/2010  Job:  [id number]  Electronically Signed by Sharyl Nimrod P.A. on 11/10/2010 12:14:24 PM Electronically Signed by Pierce Crane MD on 11/15/2010 09:31:24 PM

## 2010-11-16 LAB — CBC
HCT: 28.1 % — ABNORMAL LOW (ref 39.0–52.0)
Hemoglobin: 9.2 g/dL — ABNORMAL LOW (ref 13.0–17.0)
MCH: 28.6 pg (ref 26.0–34.0)
MCHC: 32.7 g/dL (ref 30.0–36.0)
MCV: 87.3 fL (ref 78.0–100.0)
Platelets: 380 10*3/uL (ref 150–400)
RBC: 3.22 MIL/uL — ABNORMAL LOW (ref 4.22–5.81)
RDW: 18.1 % — ABNORMAL HIGH (ref 11.5–15.5)
WBC: 8.2 10*3/uL (ref 4.0–10.5)

## 2010-11-16 LAB — PROTIME-INR
INR: 3.1 — ABNORMAL HIGH (ref 0.00–1.49)
Prothrombin Time: 32 seconds — ABNORMAL HIGH (ref 11.6–15.2)

## 2010-11-17 LAB — CULTURE, BLOOD (ROUTINE X 2)
Culture  Setup Time: 201204201629
Culture  Setup Time: 201204201629
Culture: NO GROWTH
Culture: NO GROWTH

## 2010-11-21 ENCOUNTER — Other Ambulatory Visit: Payer: Self-pay | Admitting: Physician Assistant

## 2010-11-21 ENCOUNTER — Encounter (HOSPITAL_BASED_OUTPATIENT_CLINIC_OR_DEPARTMENT_OTHER): Payer: PRIVATE HEALTH INSURANCE | Admitting: Oncology

## 2010-11-21 DIAGNOSIS — I82409 Acute embolism and thrombosis of unspecified deep veins of unspecified lower extremity: Secondary | ICD-10-CM

## 2010-11-21 DIAGNOSIS — Z7901 Long term (current) use of anticoagulants: Secondary | ICD-10-CM

## 2010-11-21 DIAGNOSIS — C9000 Multiple myeloma not having achieved remission: Secondary | ICD-10-CM

## 2010-11-21 DIAGNOSIS — Z5112 Encounter for antineoplastic immunotherapy: Secondary | ICD-10-CM

## 2010-11-21 LAB — CULTURE, BLOOD (ROUTINE X 2)
Culture  Setup Time: 201204240604
Culture  Setup Time: 201204240605
Culture: NO GROWTH
Culture: NO GROWTH

## 2010-11-21 LAB — COMPREHENSIVE METABOLIC PANEL
ALT: 91 U/L — ABNORMAL HIGH (ref 0–53)
AST: 59 U/L — ABNORMAL HIGH (ref 0–37)
Albumin: 2.5 g/dL — ABNORMAL LOW (ref 3.5–5.2)
Alkaline Phosphatase: 186 U/L — ABNORMAL HIGH (ref 39–117)
BUN: 10 mg/dL (ref 6–23)
CO2: 28 mEq/L (ref 19–32)
Calcium: 8.6 mg/dL (ref 8.4–10.5)
Chloride: 100 mEq/L (ref 96–112)
Creatinine, Ser: 0.73 mg/dL (ref 0.40–1.50)
Glucose, Bld: 110 mg/dL — ABNORMAL HIGH (ref 70–99)
Potassium: 3 mEq/L — ABNORMAL LOW (ref 3.5–5.3)
Sodium: 140 mEq/L (ref 135–145)
Total Bilirubin: 0.9 mg/dL (ref 0.3–1.2)
Total Protein: 7.1 g/dL (ref 6.0–8.3)

## 2010-11-21 LAB — CBC WITH DIFFERENTIAL/PLATELET
BASO%: 0.3 % (ref 0.0–2.0)
Basophils Absolute: 0 10*3/uL (ref 0.0–0.1)
EOS%: 2.1 % (ref 0.0–7.0)
Eosinophils Absolute: 0.2 10*3/uL (ref 0.0–0.5)
HCT: 31.2 % — ABNORMAL LOW (ref 38.4–49.9)
HGB: 10.3 g/dL — ABNORMAL LOW (ref 13.0–17.1)
LYMPH%: 7.2 % — ABNORMAL LOW (ref 14.0–49.0)
MCH: 28.5 pg (ref 27.2–33.4)
MCHC: 33 g/dL (ref 32.0–36.0)
MCV: 86.4 fL (ref 79.3–98.0)
MONO#: 0.3 10*3/uL (ref 0.1–0.9)
MONO%: 2.6 % (ref 0.0–14.0)
NEUT#: 10.1 10*3/uL — ABNORMAL HIGH (ref 1.5–6.5)
NEUT%: 87.8 % — ABNORMAL HIGH (ref 39.0–75.0)
Platelets: 307 10*3/uL (ref 140–400)
RBC: 3.61 10*6/uL — ABNORMAL LOW (ref 4.20–5.82)
RDW: 18.1 % — ABNORMAL HIGH (ref 11.0–14.6)
WBC: 11.5 10*3/uL — ABNORMAL HIGH (ref 4.0–10.3)
lymph#: 0.8 10*3/uL — ABNORMAL LOW (ref 0.9–3.3)
nRBC: 0 % (ref 0–0)

## 2010-11-21 LAB — PROTIME-INR
INR: 2.6 (ref 2.00–3.50)
Protime: 31.2 Seconds — ABNORMAL HIGH (ref 10.6–13.4)

## 2010-11-21 LAB — TECHNOLOGIST REVIEW: Technologist Review: 1

## 2010-11-21 NOTE — Assessment & Plan Note (Signed)
[name] is back regarding his T8 myeloma with paraplegia.  He has had no improvement in his lower extremity strength.  His back pain and spasms had been better.  Still requires [facility] lift for transfers.  He is having little pain in his legs.  He does have occasional spasm there, but really not a problem.  He is on a every day bowel program and does notice a bit of bright red blood over the last couple days.  We just completed his Lovenox over the weekend.  He is self cathing and occasionally has had some difficulties with the process.  He did acquire some Coude tip catheters, which were much easier for him to past.  His pain is 1/10-4/10.  He is still undergoing chemo weekly per Dr. Donnie Coffin. He does complain about a tooth that is loose in the right upper plate that he is interested in having dental work on.  REVIEW OF SYSTEMS:  Notable for the above items.  He has had decreased taste, smell, depression, anxiety.  Full 12-point review is in the written health and history section of the chart.  SOCIAL HISTORY:  The patient is married, living with his wife and kids. His wife is with him today and is extremely supportive.  PHYSICAL EXAM:  VITAL SIGNS:  Blood pressure is 114/70, pulse 90, respiratory rate 18, satting 93% on room air. GENERAL:  Patient is pleasant, alert, and oriented x3.  Affect is generally bright and appropriate.  He is sitting in his chair with his TLSO.  He has no strength in either leg and a T8 sensory level still with no sensory function below this level.  He had no resting tone in the upper or lower extremities today.  He was hyperreflexic at 2+ and 3+ in the knee and ankles.  Cognition was alert and appropriate.  He did occasionally have some problems with memory and focus, but was conversationally good. HEART:  Regular. CHEST:  Clear. ABDOMEN:  Soft, nontender.  ASSESSMENT: 1. T8 myeloma with paraplegia. 2. Postoperative ileus, which resolved. 3. Neurogenic bowel  and bladder with some recent blood in the stool. 4. Transient ulnar nerve traction from prolonged sitting and bed rest.  PLAN: 1. At this point, I did not prescribe any new medication for pain or     spasms.  He uses Robaxin occasionally for the back symptoms.  He is     weaning off the oxycodone, is only using the p.r.n. dose as now. 2. Refill dexamethasone, which he takes prior to chemotherapy.  I will     continue this per Dr. Renelda Loma direction. 3. Request the patient to follow up with Dr. Donnie Coffin regarding dental     procedures.  If it is not life-threatening or emergent, I am sure     he will prefer to hold off.  He may need some antibiotics prior to     going to any type of surgery as well. 4. Continue with bladder cath as current.  He is to use Coude tip for     better passage.  He see his Urology apparently this week. 5. TLSO per Neurosurgery.  I believe he will be taking this off in     about a month. 6. I will see the patient back in 2 months' time.  He is to call me     with any problems or questions.     Ranelle Oyster, M.D. Electronically Signed    ZTS/MedQ D:  10/04/2010 13:16:30  T:  10/04/2010 21:53:07  Job #:  [id number]

## 2010-11-22 LAB — IGG, IGA, IGM
IgA: 77 mg/dL (ref 68–378)
IgG (Immunoglobin G), Serum: 1540 mg/dL (ref 694–1618)
IgM, Serum: 77 mg/dL (ref 60–263)

## 2010-11-28 ENCOUNTER — Other Ambulatory Visit: Payer: Self-pay | Admitting: Physician Assistant

## 2010-11-28 ENCOUNTER — Encounter (HOSPITAL_BASED_OUTPATIENT_CLINIC_OR_DEPARTMENT_OTHER): Payer: Managed Care, Other (non HMO) | Admitting: Oncology

## 2010-11-28 DIAGNOSIS — I82409 Acute embolism and thrombosis of unspecified deep veins of unspecified lower extremity: Secondary | ICD-10-CM

## 2010-11-28 DIAGNOSIS — Z5112 Encounter for antineoplastic immunotherapy: Secondary | ICD-10-CM

## 2010-11-28 DIAGNOSIS — Z7901 Long term (current) use of anticoagulants: Secondary | ICD-10-CM

## 2010-11-28 DIAGNOSIS — C9000 Multiple myeloma not having achieved remission: Secondary | ICD-10-CM

## 2010-11-28 LAB — CBC WITH DIFFERENTIAL/PLATELET
BASO%: 0 % (ref 0.0–2.0)
Basophils Absolute: 0 10*3/uL (ref 0.0–0.1)
EOS%: 0.8 % (ref 0.0–7.0)
Eosinophils Absolute: 0.1 10*3/uL (ref 0.0–0.5)
HCT: 32.2 % — ABNORMAL LOW (ref 38.4–49.9)
HGB: 10.6 g/dL — ABNORMAL LOW (ref 13.0–17.1)
LYMPH%: 6.3 % — ABNORMAL LOW (ref 14.0–49.0)
MCH: 29.8 pg (ref 27.2–33.4)
MCHC: 33.1 g/dL (ref 32.0–36.0)
MCV: 90 fL (ref 79.3–98.0)
MONO#: 0.9 10*3/uL (ref 0.1–0.9)
MONO%: 8.2 % (ref 0.0–14.0)
NEUT#: 9.3 10*3/uL — ABNORMAL HIGH (ref 1.5–6.5)
NEUT%: 84.7 % — ABNORMAL HIGH (ref 39.0–75.0)
Platelets: 285 10*3/uL (ref 140–400)
RBC: 3.58 10*6/uL — ABNORMAL LOW (ref 4.20–5.82)
RDW: 20 % — ABNORMAL HIGH (ref 11.0–14.6)
WBC: 10.9 10*3/uL — ABNORMAL HIGH (ref 4.0–10.3)
lymph#: 0.7 10*3/uL — ABNORMAL LOW (ref 0.9–3.3)

## 2010-11-28 LAB — PROTIME-INR
INR: 2.6 (ref 2.00–3.50)
Protime: 31.2 Seconds — ABNORMAL HIGH (ref 10.6–13.4)

## 2010-11-29 ENCOUNTER — Encounter: Payer: Managed Care, Other (non HMO) | Admitting: Physical Medicine & Rehabilitation

## 2010-11-30 LAB — BETA 2 MICROGLOBULIN, SERUM: Beta-2 Microglobulin: 2.01 mg/L — ABNORMAL HIGH (ref 1.01–1.73)

## 2010-11-30 LAB — COMPREHENSIVE METABOLIC PANEL
ALT: 42 U/L (ref 0–53)
AST: 22 U/L (ref 0–37)
Albumin: 3.3 g/dL — ABNORMAL LOW (ref 3.5–5.2)
Alkaline Phosphatase: 151 U/L — ABNORMAL HIGH (ref 39–117)
BUN: 7 mg/dL (ref 6–23)
CO2: 23 mEq/L (ref 19–32)
Calcium: 8.2 mg/dL — ABNORMAL LOW (ref 8.4–10.5)
Chloride: 103 mEq/L (ref 96–112)
Creatinine, Ser: 0.49 mg/dL (ref 0.40–1.50)
Glucose, Bld: 108 mg/dL — ABNORMAL HIGH (ref 70–99)
Potassium: 3 mEq/L — ABNORMAL LOW (ref 3.5–5.3)
Sodium: 143 mEq/L (ref 135–145)
Total Bilirubin: 0.5 mg/dL (ref 0.3–1.2)
Total Protein: 6.4 g/dL (ref 6.0–8.3)

## 2010-11-30 LAB — IMMUNOFIXATION ELECTROPHORESIS
IgA: 63 mg/dL — ABNORMAL LOW (ref 68–378)
IgG (Immunoglobin G), Serum: 1380 mg/dL (ref 694–1618)
IgM, Serum: 57 mg/dL — ABNORMAL LOW (ref 60–263)
Total Protein, Serum Electrophoresis: 6.4 g/dL (ref 6.0–8.3)

## 2010-11-30 NOTE — Discharge Summary (Signed)
[name], [name]               ACCOUNT NO.:  [id number]  MEDICAL RECORD NO.:  [id number]           PATIENT TYPE:  I  LOCATION:  1338                         [facility]ACILITY[facility]  [facility]  PHYSICIAN:  Pierce Crane, M.D., F.R.C.P.C.DATE OF BIRTH:  October 21, 1955  DATE OF ADMISSION:  11/07/2010 DATE OF DISCHARGE:  11/16/2010                              DISCHARGE SUMMARY   DISCHARGE DIAGNOSES:  History of multiple myeloma, admission for recurrent deep venous thrombosis despite therapeutic anticoagulation.  HISTORY:  [name] is a 55 year old gentleman, well-known history of multiple myeloma diagnosed July 2011 after he presented with lower extremity weakness and cord compression despite stabilization, decompression with Neurosurgery.  He has since been treated with chemotherapy after receiving radiation therapy to his back.  He has been on Revlimid, Velcade , Decadron, as well as prophylactic Coumadin with therapeutic INR.  He had presented with a lower extremity swelling and a Doppler which confirmed the presence of extensive DVT.  The patient was admitted to the hospital for further management.  Of note, his INR on the day of admission was 2.6 on 5 mg Coumadin.  PAST MEDICAL HISTORY:  Well documented in the chart.  COURSE IN THE HOSPITAL:  The patient was admitted to the hospital.  On admission, hemoglobin 9.2, platelet count 152,000, white count 12.8. The patient was initially placed on fractionated heparin and was maintained via the pharmacy service, with therapeutic Xa levels.  He was seen by Interventional Radiology for consideration of an IVC filter. IVC filter was placed on November 09, 2010.  He tolerated the procedure well.  His course was complicated in hospital with intermittent chills. Cultures were obtained which were negative.  He did have some loose bowel movements and C difficile was also obtained which was negative. He was placed initially on Avelox and ultimately placed on  Primaxin, but note if the cultures were all negative, this was discontinued.  He remained afebrile for 48 hours prior to discharge.  He had no other complications while in hospital.  His appetite was somewhat decreased. His lower extremity swelling did improve while in hospital.  On day of discharge, INR was 3.1 on 5 mg Coumadin and it was elected to stop. Hold his Coumadin on day of discharge to resume with 5 mg daily except for Mondays and Thursdays.  He would receive 2.5 mg.  His discharge medications were to include Macrobid 100 mg b.i.d., oxycodone 5 mg IR q.3 h. p.r.n., MiraLax 17 g daily, Compazine 10 mg p.r.n., acyclovir 400 mg b.i.d., Septra tablet Monday, Wednesday, Friday, Revlimid will be resumed at 50 mg daily.  He will return to see Dr. Dorothe Pea at [facility] on Friday for recheck INR.  In addition, he was instructed to return to our clinic on Monday, 30th of April for resumption of his chemotherapy.  The patient was discharged from the hospital in stable condition.  Prognosis is guarded.  CONSULTATIONS OBTAINED IN HOSPITAL:  Interventional Radiology.     Pierce Crane, M.D., F.R.C.P.C.     PR/MEDQ  D:  11/16/2010  T:  11/16/2010  Job:  [id number]  Electronically Signed by Theron Arista  Lance Huaracha MD on 11/30/2010 08:47:50 AM

## 2010-12-05 ENCOUNTER — Other Ambulatory Visit: Payer: Self-pay | Admitting: Physician Assistant

## 2010-12-05 ENCOUNTER — Encounter (HOSPITAL_BASED_OUTPATIENT_CLINIC_OR_DEPARTMENT_OTHER): Payer: Managed Care, Other (non HMO) | Admitting: Oncology

## 2010-12-05 DIAGNOSIS — C9000 Multiple myeloma not having achieved remission: Secondary | ICD-10-CM

## 2010-12-05 DIAGNOSIS — Z7901 Long term (current) use of anticoagulants: Secondary | ICD-10-CM

## 2010-12-05 DIAGNOSIS — Z5112 Encounter for antineoplastic immunotherapy: Secondary | ICD-10-CM

## 2010-12-05 DIAGNOSIS — I82409 Acute embolism and thrombosis of unspecified deep veins of unspecified lower extremity: Secondary | ICD-10-CM

## 2010-12-05 LAB — COMPREHENSIVE METABOLIC PANEL
ALT: 37 U/L (ref 0–53)
AST: 18 U/L (ref 0–37)
Albumin: 3.5 g/dL (ref 3.5–5.2)
Alkaline Phosphatase: 124 U/L — ABNORMAL HIGH (ref 39–117)
BUN: 8 mg/dL (ref 6–23)
CO2: 21 mEq/L (ref 19–32)
Calcium: 8.9 mg/dL (ref 8.4–10.5)
Chloride: 104 mEq/L (ref 96–112)
Creatinine, Ser: 0.57 mg/dL (ref 0.40–1.50)
Glucose, Bld: 129 mg/dL — ABNORMAL HIGH (ref 70–99)
Potassium: 3.2 mEq/L — ABNORMAL LOW (ref 3.5–5.3)
Sodium: 141 mEq/L (ref 135–145)
Total Bilirubin: 0.5 mg/dL (ref 0.3–1.2)
Total Protein: 6.2 g/dL (ref 6.0–8.3)

## 2010-12-05 LAB — CBC WITH DIFFERENTIAL/PLATELET
BASO%: 0.2 % (ref 0.0–2.0)
Basophils Absolute: 0 10*3/uL (ref 0.0–0.1)
EOS%: 5 % (ref 0.0–7.0)
Eosinophils Absolute: 0.6 10*3/uL — ABNORMAL HIGH (ref 0.0–0.5)
HCT: 30.7 % — ABNORMAL LOW (ref 38.4–49.9)
HGB: 10.2 g/dL — ABNORMAL LOW (ref 13.0–17.1)
LYMPH%: 6.4 % — ABNORMAL LOW (ref 14.0–49.0)
MCH: 28.5 pg (ref 27.2–33.4)
MCHC: 33.2 g/dL (ref 32.0–36.0)
MCV: 85.8 fL (ref 79.3–98.0)
MONO#: 1 10*3/uL — ABNORMAL HIGH (ref 0.1–0.9)
MONO%: 8.4 % (ref 0.0–14.0)
NEUT#: 9.8 10*3/uL — ABNORMAL HIGH (ref 1.5–6.5)
NEUT%: 80 % — ABNORMAL HIGH (ref 39.0–75.0)
Platelets: 272 10*3/uL (ref 140–400)
RBC: 3.58 10*6/uL — ABNORMAL LOW (ref 4.20–5.82)
RDW: 18 % — ABNORMAL HIGH (ref 11.0–14.6)
WBC: 12.3 10*3/uL — ABNORMAL HIGH (ref 4.0–10.3)
lymph#: 0.8 10*3/uL — ABNORMAL LOW (ref 0.9–3.3)
nRBC: 0 % (ref 0–0)

## 2010-12-05 LAB — PROTIME-INR
INR: 2.1 (ref 2.00–3.50)
Protime: 25.2 Seconds — ABNORMAL HIGH (ref 10.6–13.4)

## 2010-12-08 ENCOUNTER — Inpatient Hospital Stay (HOSPITAL_COMMUNITY)
Admission: AD | Admit: 2010-12-08 | Discharge: 2010-12-12 | DRG: 372 | Disposition: A | Discharge status: Home / Self Care | Payer: PRIVATE HEALTH INSURANCE | Source: Ambulatory Visit | Attending: Family Medicine | Admitting: Family Medicine

## 2010-12-08 ENCOUNTER — Inpatient Hospital Stay (HOSPITAL_COMMUNITY): Payer: PRIVATE HEALTH INSURANCE

## 2010-12-08 DIAGNOSIS — N319 Neuromuscular dysfunction of bladder, unspecified: Secondary | ICD-10-CM | POA: Diagnosis present

## 2010-12-08 DIAGNOSIS — R112 Nausea with vomiting, unspecified: Secondary | ICD-10-CM | POA: Diagnosis present

## 2010-12-08 DIAGNOSIS — Z86718 Personal history of other venous thrombosis and embolism: Secondary | ICD-10-CM

## 2010-12-08 DIAGNOSIS — E2749 Other adrenocortical insufficiency: Secondary | ICD-10-CM | POA: Diagnosis present

## 2010-12-08 DIAGNOSIS — A0472 Enterocolitis due to Clostridium difficile, not specified as recurrent: Principal | ICD-10-CM | POA: Diagnosis present

## 2010-12-08 DIAGNOSIS — G8220 Paraplegia, unspecified: Secondary | ICD-10-CM | POA: Diagnosis present

## 2010-12-08 DIAGNOSIS — N390 Urinary tract infection, site not specified: Secondary | ICD-10-CM | POA: Diagnosis present

## 2010-12-08 DIAGNOSIS — C9000 Multiple myeloma not having achieved remission: Secondary | ICD-10-CM | POA: Diagnosis present

## 2010-12-08 DIAGNOSIS — Z7901 Long term (current) use of anticoagulants: Secondary | ICD-10-CM

## 2010-12-08 DIAGNOSIS — E860 Dehydration: Secondary | ICD-10-CM | POA: Diagnosis present

## 2010-12-08 DIAGNOSIS — K592 Neurogenic bowel, not elsewhere classified: Secondary | ICD-10-CM | POA: Diagnosis present

## 2010-12-08 LAB — CBC
HCT: 28.4 % — ABNORMAL LOW (ref 39.0–52.0)
Hemoglobin: 9.9 g/dL — ABNORMAL LOW (ref 13.0–17.0)
MCH: 28.9 pg (ref 26.0–34.0)
MCHC: 34.9 g/dL (ref 30.0–36.0)
MCV: 82.8 fL (ref 78.0–100.0)
Platelets: 102 10*3/uL — ABNORMAL LOW (ref 150–400)
RBC: 3.43 MIL/uL — ABNORMAL LOW (ref 4.22–5.81)
RDW: 17.8 % — ABNORMAL HIGH (ref 11.5–15.5)
WBC: 10.1 10*3/uL (ref 4.0–10.5)

## 2010-12-08 LAB — COMPREHENSIVE METABOLIC PANEL
ALT: 26 U/L (ref 0–53)
AST: 22 U/L (ref 0–37)
Albumin: 2.1 g/dL — ABNORMAL LOW (ref 3.5–5.2)
Alkaline Phosphatase: 91 U/L (ref 39–117)
BUN: 15 mg/dL (ref 6–23)
CO2: 22 mEq/L (ref 19–32)
Calcium: 7.6 mg/dL — ABNORMAL LOW (ref 8.4–10.5)
Chloride: 103 mEq/L (ref 96–112)
Creatinine, Ser: 0.77 mg/dL (ref 0.4–1.5)
GFR calc Af Amer: >60 mL/min (ref >60)
GFR calc non Af Amer: >60 mL/min (ref >60)
Glucose, Bld: 79 mg/dL (ref 70–99)
Potassium: 3.8 mEq/L (ref 3.5–5.1)
Sodium: 135 mEq/L (ref 135–145)
Total Bilirubin: 0.5 mg/dL (ref 0.3–1.2)
Total Protein: 6.1 g/dL (ref 6.0–8.3)

## 2010-12-08 LAB — AMYLASE: Amylase: 244 U/L — ABNORMAL HIGH (ref 0–105)

## 2010-12-08 LAB — LIPASE, BLOOD: Lipase: 403 U/L — ABNORMAL HIGH (ref 11–59)

## 2010-12-08 LAB — PROTIME-INR
INR: 3.27 — ABNORMAL HIGH (ref 0.00–1.49)
Prothrombin Time: 33.3 seconds — ABNORMAL HIGH (ref 11.6–15.2)

## 2010-12-09 ENCOUNTER — Encounter (HOSPITAL_COMMUNITY): Payer: Self-pay | Admitting: Radiology

## 2010-12-09 LAB — URINE MICROSCOPIC-ADD ON

## 2010-12-09 LAB — URINALYSIS, ROUTINE W REFLEX MICROSCOPIC
Bilirubin Urine: NEGATIVE
Glucose, UA: NEGATIVE mg/dL
Hgb urine dipstick: NEGATIVE
Ketones, ur: NEGATIVE mg/dL
Nitrite: NEGATIVE
Protein, ur: 30 mg/dL — AB
Specific Gravity, Urine: 1.018 (ref 1.005–1.030)
Urobilinogen, UA: 1 mg/dL (ref 0.0–1.0)
pH: 7 (ref 5.0–8.0)

## 2010-12-09 LAB — PROTIME-INR
INR: 3.76 — ABNORMAL HIGH (ref 0.00–1.49)
Prothrombin Time: 37.1 seconds — ABNORMAL HIGH (ref 11.6–15.2)

## 2010-12-09 LAB — MRSA PCR SCREENING: MRSA by PCR: NEGATIVE

## 2010-12-09 MED ORDER — IOHEXOL 300 MG/ML  SOLN
100.0000 mL | Freq: Once | INTRAMUSCULAR | Status: AC | PRN
  Administered 2010-12-09: 100 mL via INTRAVENOUS

## 2010-12-10 LAB — PROTIME-INR
INR: 3.84 — ABNORMAL HIGH (ref 0.00–1.49)
Prothrombin Time: 37.7 seconds — ABNORMAL HIGH (ref 11.6–15.2)

## 2010-12-10 LAB — CLOSTRIDIUM DIFFICILE BY PCR

## 2010-12-11 LAB — LIPASE, BLOOD: Lipase: 427 U/L — ABNORMAL HIGH (ref 11–59)

## 2010-12-11 LAB — BASIC METABOLIC PANEL
BUN: 8 mg/dL (ref 6–23)
CO2: 21 mEq/L (ref 19–32)
Calcium: 7.1 mg/dL — ABNORMAL LOW (ref 8.4–10.5)
Chloride: 108 mEq/L (ref 96–112)
Creatinine, Ser: 0.56 mg/dL (ref 0.4–1.5)
GFR calc Af Amer: >60 mL/min (ref >60)
GFR calc non Af Amer: >60 mL/min (ref >60)
Glucose, Bld: 95 mg/dL (ref 70–99)
Potassium: 3.7 mEq/L (ref 3.5–5.1)
Sodium: 138 mEq/L (ref 135–145)

## 2010-12-11 LAB — CBC
HCT: 28.1 % — ABNORMAL LOW (ref 39.0–52.0)
Hemoglobin: 9.3 g/dL — ABNORMAL LOW (ref 13.0–17.0)
MCH: 27.6 pg (ref 26.0–34.0)
MCHC: 33.1 g/dL (ref 30.0–36.0)
MCV: 83.4 fL (ref 78.0–100.0)
Platelets: 186 10*3/uL (ref 150–400)
RBC: 3.37 MIL/uL — ABNORMAL LOW (ref 4.22–5.81)
RDW: 17.7 % — ABNORMAL HIGH (ref 11.5–15.5)
WBC: 3.9 10*3/uL — ABNORMAL LOW (ref 4.0–10.5)

## 2010-12-11 LAB — URINE CULTURE
Colony Count: 50000
Culture  Setup Time: 201205180918
Special Requests: NEGATIVE

## 2010-12-11 LAB — PROTIME-INR
INR: 4.2 — ABNORMAL HIGH (ref 0.00–1.49)
Prothrombin Time: 40.4 seconds — ABNORMAL HIGH (ref 11.6–15.2)

## 2010-12-11 LAB — AMYLASE: Amylase: 246 U/L — ABNORMAL HIGH (ref 0–105)

## 2010-12-12 LAB — PROTIME-INR
INR: 3.89 — ABNORMAL HIGH (ref 0.00–1.49)
Prothrombin Time: 38.1 seconds — ABNORMAL HIGH (ref 11.6–15.2)

## 2010-12-12 LAB — GIARDIA/CRYPTOSPORIDIUM SCREEN(EIA)
Cryptosporidium Screen (EIA): NEGATIVE
Giardia Screen - EIA: NEGATIVE

## 2010-12-12 NOTE — H&P (Signed)
[name], [name]               ACCOUNT NO.:  [id number]  MEDICAL RECORD NO.:  [id number]           PATIENT TYPE:  I  LOCATION:  5526                         FACILITY:  [facility]  PHYSICIAN:  Jethro Bastos, M.D.DATE OF BIRTH:  01/15/1956  DATE OF ADMISSION:  12/08/2010 DATE OF DISCHARGE:                             HISTORY & PHYSICAL   CHIEF COMPLAINT:  Dehydration.  HISTORY OF PRESENT ILLNESS:  This 55 year old male was in general good health until this past fall when he developed multiple myeloma that involved a T8 vertebrae.  He subsequently became paraplegic despite radiation therapy and surgery.  He was discharged home from the nursing home in February.  He has been a participant at [facility] since April 1.  He has been getting chemotherapy by Dr. Pierce Crane.  That consist of cycles of Velcade and once a month Zometa.  He has been on dexamethasone.  He was doing fairly well until Monday, the 14th.  He had his Velcade infusion.  That afternoon he developed nausea and vomiting and diarrhea.  He has had similar symptoms in the past with Velcade but never this severe.  Symptoms persisted overnight.  At the [facility] on the 15th, he received 2.5 liters of D5 normal saline and seem to be somewhat improved.  He was given some of his oral medications including Coumadin, Oxycodone, and methocarbamol, Protonix, dexamethasone, and Compazine.  He was also given Zofran 8 mg IM and morphine sulfate 5 mg IM because of spasms of pain around his trunk.  He also received 62.5 mg of Solu-Medrol IM as it was felt that he may have not had received his first dose of dexamethasone on the day of chemo.  By the next morning, that is yesterday morning, he seemed improved.  He was able to keep down some food and fluids and was able to socialize.  However, by the afternoon he felt weaker again.  By this morning, he is still quite weak and continues with nausea and vomiting.  He is now  being admitted to the hospital because of dehydration but also to evaluate the acute onset of this nausea, vomiting, diarrhea.  PAST MEDICAL HISTORY:  No drug allergies.  MEDICATIONS: 1. MiraLax 17 grams daily. 2. Protonix 40 mg daily. 3. Acyclovir 400 mg daily. 4. Septra DS 1 tablet Monday, Wednesday, and Friday. 5. Oxycodone 5 mg 1 p.o. q.4 h. p.r.n. pain. 6. Dulcolax suppository 1 per rectum at bedtime. 7. Acetaminophen 325 mg 2 tablets every 6 hours p.r.n. mild pain. 8. Methocarbamol 500 mg every 6 hours p.r.n. muscle spasms and chest     and abdominal wall area. 9. Dexamethasone 2 mg daily. 10.Macrobid 100 mg b.i.d. 11.Revlimid 15 mg at bedtime. 12.Coumadin 5 mg alternating with Coumadin 7.5 mg every other day.  MEDICAL PROBLEMS: 1. Multiple myeloma 2. 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 3. Cognitive impairment possibly related to chemotherapy. 4. Depression. 5. Neurogenic bladder. 6. Neurogenic colon 7. Adrenal insufficiency. 8. Chronic anticoagulation. 9. 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  PAST SURGICAL HISTORY:  Left heel spur surgery in the mid 1970s, vasectomy  in the 1980s, thoracotomy with T8 vertebrectomy and T7 through T9 cage placement and chest tube December 2011 and Greenfield inferior vena cava filter placed in March 2012.  HOSPITALIZATIONS:  Hospitalization in December for the newly diagnosed acute myeloma and also in April for bilateral DVT and subsequent Greenfield filter placement.  FAMILY HISTORY:  Father died in his 23s from congestive heart failure and diabetes.  Mother died in her 54s from colon cancer.  Has one brother and two half-brothers and one sister but does not know much about their health.  He has three biological sons and one biological daughter.  SOCIAL HISTORY:  He has actually been married for a total of three times but was divorced twice but married now to a second wife again for 10 years, originally divorced  because of her problem with alcohol and drugs but she has been in rehab and has done quite well.  Does not smoke and does not use alcohol and does not use IV drugs or any illegal drugs.  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  He is living at home with his wife and step children.  He has significant family support and friend's support also to remain at home even though he is paraplegic. He comes to the [facility] 5 days a week for extra care and also received extensive home care as well.  REVIEW OF SYSTEMS:  Because of acute illness, he has developed some hot and cold chills.  He feels quite exhausted with continued nausea, episodic vomiting, and diarrhea.  Denies any respiratory problem but some abdominal discomfort.  He does have chronic problems with spasms around the T8 level across his lower chest and upper abdomen.  He uses methocarbamol as needed for pain and oxycodone as needed for pain.  He was paralyzed from the T8 level down and has no sensation or voluntary movement.  However, he does have some occasional tremors and spasms down his lower extremities which are quite painful but not voluntary.  He has a neurogenic colon and needs regular MiraLax and Dulcolax suppositories and even morning rectal stimulation to have a regular bowel movement. However, for the last 3 days, he has had severe diarrhea which been watery and brown.  He did have an episode of bright red rectal bleeding 2 weeks ago that amounted to about 60 mL.  Rectal exam at that time showed internal hemorrhoid on the right.  He does have a neurogenic bladder.  Condom catheter would not work because he developed urinary retention.  He has an indwelling catheter now for the last 2 weeks.  He has developed some urethral irritation because of the Foley catheter rubbing on the inferior urethral meatus but that seems to be improved over the last few days with local wound care.  After surgery he  did have a rigid brace across his trunk but that has been discontinued on the suggestion of Dr. Venetia Maxon, his neurosurgeon.  He has been getting physical therapy and occupational therapy at the [facility].  He does have problems with erections which is related to his T8 lesion.  Regarding his myeloma, his myeloma seemed to be isolated to the T8 vertebrae and workup for other lesions apparently did not show any other lesions.  Dr. Pierce Crane is following him for this and treating him for this.  PHYSICAL EXAMINATION:  VITAL SIGNS:  Temperature is 98.1, pulse is 84, respiratory rate is 20, blood pressure is 119/86, O2 sat is  94%.  His weight 6 weeks ago was 234.4 pounds but he has not been weighed since then. GENERAL:  He is lying in an exam bed at the [facility] when I examined him.  He is oriented x3, although obviously extremely fatigued.  He is obese. HEENT:  His pupils are equally reactive.  EOMI.  Eyes nonicteric. Cranial nerves intact.  TMs were normal.  I could not view the pharynx because of a strong gag reflex but his oral mucosa looked normal. NECK:  No thyromegaly or adenopathy. LUNGS:  Auscultated anteriorly and laterally were clear, but because he is so debilitated and bed-bound, I was unable to listen to his back at this time. CARDIOVASCULAR:  Heart sounds normal and regular without murmur, rub, or gallop. ABDOMEN:  Somewhat distended and diffusely tender, more so in the upper quadrants bilaterally. GENITALIA:  Reveals a Foley catheter.  Testicles were normal.  Healing pressure sore on the lower urethral meatus. EXTREMITIES:  Significant chronic edema which is at baseline.  It is more on the left side than the right side.  He does have involuntary twitches of both legs which are painful and seemed to be triggered by movement of the legs or feet.  He does have reflexes.  ASSESSMENT:  This 55 year old male was generally healthy until this past fall when he developed  multiple myeloma and developed paraplegia from the T8 vertebrae down.  This has been complicated by DVT and neurogenic bladder and neurogenic colon. He is subsequently status post Greenfield filter in the inferior vena cava.  Over the last 3 days, he developed nausea, vomiting, and profuse diarrhea.  At first this was felt to be related to his Velcade (chemotherapy) but as he has not improved, then I am considering other alternatives.  He was on antibiotics in April and so he could have a C. difficile infection.  His abdomen seems to be somewhat distended now and tender and an intra-abdominal process is certainly possible.  He does have an indwelling catheter and chronic neurogenic bladder, so a urinary tract infection or pyelonephritis is a possibility.  However, he is on prophylactic Macrobid to help prevent UTIs.  He is on Septra DS to prevent Pneumocystis pneumonia as he is on chronic steroid therapy.  He also has adrenal insufficiency because of his chronic steroid therapy and has had episodes where he manifested symptoms of adrenal insufficiency.  He is on acyclovir to prevent herpes simplex or herpes zoster reactivation since he is on chronic steroids.  He does seem to be somewhat dehydrated and may have electrolyte imbalances.  He does have chronic deep venous thrombosis in both lower extremities but this does not seem to be any worse.  He is on chronic anticoagulation with Coumadin but he has not been able to take his medications consistently the last couple days, so this may be off.  PLAN:  He is being admitted to the hospital under my service.  I plan to obtain lab work and chest x-ray and CT scan of the abdomen.  We will check an INR and adjust Coumadin as needed.  If he is not properly anticoagulated would add Lovenox.  We will continue with his current medications as able.  IV fluids to rehydrate.  I have let Dr. Donnie Coffin know that he is being admitted to the hospital.  We will  get blood cultures and urine cultures and stool for C. difficile.  Because of his T8 paraplegia, he has extensive nursing needs.  He is  at high risk for skin breakdown and in fact does have a pressure ulcer on the inferior urethral meatus.  We will need to be careful to make sure the positioning of the Foley catheter does not rub against this area.          ______________________________ Jethro Bastos, M.D.     RNK/MEDQ  D:  12/08/2010  T:  12/09/2010  Job:  [id number]  cc:   Pierce Crane, M.D., F.R.C.P.C.  Electronically Signed by Marny Lowenstein M.D. on 12/12/2010 09:13:37 AM

## 2010-12-15 LAB — CULTURE, BLOOD (ROUTINE X 2)
Culture  Setup Time: 201205180214
Culture  Setup Time: 201205180215
Culture: NO GROWTH
Culture: NO GROWTH

## 2010-12-20 ENCOUNTER — Other Ambulatory Visit: Payer: Self-pay | Admitting: Physician Assistant

## 2010-12-20 ENCOUNTER — Encounter (HOSPITAL_BASED_OUTPATIENT_CLINIC_OR_DEPARTMENT_OTHER): Payer: Managed Care, Other (non HMO) | Admitting: Oncology

## 2010-12-20 DIAGNOSIS — Z7901 Long term (current) use of anticoagulants: Secondary | ICD-10-CM

## 2010-12-20 DIAGNOSIS — C9000 Multiple myeloma not having achieved remission: Secondary | ICD-10-CM

## 2010-12-20 DIAGNOSIS — Z5112 Encounter for antineoplastic immunotherapy: Secondary | ICD-10-CM

## 2010-12-20 LAB — CBC WITH DIFFERENTIAL/PLATELET
BASO%: 0 % (ref 0.0–2.0)
Basophils Absolute: 0 10*3/uL (ref 0.0–0.1)
EOS%: 0.2 % (ref 0.0–7.0)
Eosinophils Absolute: 0 10*3/uL (ref 0.0–0.5)
HCT: 28.5 % — ABNORMAL LOW (ref 38.4–49.9)
HGB: 9.7 g/dL — ABNORMAL LOW (ref 13.0–17.1)
LYMPH%: 4.8 % — ABNORMAL LOW (ref 14.0–49.0)
MCH: 29.2 pg (ref 27.2–33.4)
MCHC: 33.9 g/dL (ref 32.0–36.0)
MCV: 86.2 fL (ref 79.3–98.0)
MONO#: 0.2 10*3/uL (ref 0.1–0.9)
MONO%: 1.4 % (ref 0.0–14.0)
NEUT#: 11 10*3/uL — ABNORMAL HIGH (ref 1.5–6.5)
NEUT%: 93.6 % — ABNORMAL HIGH (ref 39.0–75.0)
Platelets: 265 10*3/uL (ref 140–400)
RBC: 3.31 10*6/uL — ABNORMAL LOW (ref 4.20–5.82)
RDW: 20 % — ABNORMAL HIGH (ref 11.0–14.6)
WBC: 11.7 10*3/uL — ABNORMAL HIGH (ref 4.0–10.3)
lymph#: 0.6 10*3/uL — ABNORMAL LOW (ref 0.9–3.3)

## 2010-12-20 LAB — PROTIME-INR
INR: 1.8 — ABNORMAL LOW (ref 2.00–3.50)
Protime: 21.6 Seconds — ABNORMAL HIGH (ref 10.6–13.4)

## 2010-12-20 NOTE — Discharge Summary (Addendum)
NAMEJAMESYN, [name]               ACCOUNT NO.:  [id number]  MEDICAL RECORD NO.:  [id number]          PATIENT TYPE:  INP  LOCATION:  3112                         FACILITY:  [facility]  PHYSICIAN:  Danae Orleans. Venetia Maxon, M.D.  DATE OF BIRTH:  08/03/1955  DATE OF ADMISSION:  07/17/2010 DATE OF DISCHARGE:  07/29/2010                              DISCHARGE SUMMARY   REASON FOR ADMISSION:  Back pain with metastatic cancer to the spine.  FINAL DIAGNOSES:  Paraplegia and metastatic cancer.  HISTORY OF ILLNESS AND HOSPITAL COURSE:  [name] is a 55 year old man who was sent to [facility] from the emergency room for a 3- month history of progressively worsening back pain.  He had noted bilateral lower extremity numbness and radiation down his legs.  He was having difficulty walking with ataxia and has to hold onto a wall.  He was also having nausea, dizziness, abdominal pressure, urinary retention, saddle anesthesia, and constipation.  The patient was admitted to [facility].  An MRI showed abnormal bone marrow signal at the L2 vertebral body.  Then, it was felt to be consistent with malignancy.  The patient was admitted for additional MRI studies and these included a MRI of the thoracic spine which demonstrated a metastatic deposit at T8.  The patient had a paravertebral mass at T8 with compression of the thoracic cord and cord edema on MRI.  This was felt to likely be consistent with multiple myeloma be started on dexamethasone.  He was monitored on the inpatient Hematology/Oncology Service and started on external beam radiation therapy for his thoracic spine.  He had a CT-guided biopsy of the T8 paravertebral mass.  He then on July 16, 2010, was noticing decreased sensation in the lower extremities and a band-like pain across the middle of his back.  He had started on palliative radiation, continued with Decadron on July 17, 2010.  He was transferred from [facility].  Repeat MRI did not show significant change or collapse of the vertebra.  He has had a significant worsening neurologically which he felt occurred over the last several days and he had full strength in both upper extremities and weak in both lower extremities, left greater than right.  Decreased hip flexion right and internal and external rotation of both lower extremities, greater than antigravity strength in the quadriceps and able to dorsiflex and plantarflex weakly in both lower extremities.  He had upgoing great toes with hyperreflexia in his lower extremities and decreased sensation from his T10 dermatome.  The patient was felt to have significant worsening and was therefore taken to surgery for thoracotomy and T8 vertebrectomy which was done early in morning of July 18, 2010.  This was uneventful surgery.  Postoperatively, the patient had good tone in his lower extremities and volitional movement. He was felt to have good rectal tone.  On July 19, 2010, he had significant bilateral lower extremity weakness and did have sensation in his lower extremities.  He had a chest tube which was placed to suction. On July 20, 2010, he was without voluntary movement in his lower extremities, some sensation  to pinprick and position sense in the distal lower extremities.  The patient had no absent strength in his lower extremities as of July 21, 2010, and this persisted.  The patient was felt on July 22, 2010, to have paraplegia and it was felt that this was likely secondary to cord injury and he also had abdominal distention.  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  The patient had a sensory level at T8, nothing below as of July 25, 2010.  I spoke with the patient on July 27, 2010, having been out of town and advised rehabilitation, and he was transferred to the Rehabilitation Service.  On July 29, 2010, he was mobilizing with a wheelchair and  wearing a back brace.     Danae Orleans. Venetia Maxon, M.D.     JDS/MEDQ  D:  12/19/2010  T:  12/19/2010  Job:  [id number]  Electronically Signed by Maeola Harman M.D. on 12/22/2010 04:56:37 PM

## 2010-12-22 NOTE — Consult Note (Signed)
[name], [name]               ACCOUNT NO.:  [id number]  MEDICAL RECORD NO.:  [id number]           PATIENT TYPE:  I  LOCATION:  5526                         FACILITY:  [facility]  PHYSICIAN:  Shirley Friar, MDDATE OF BIRTH:  Apr 18, 1956  DATE OF CONSULTATION: DATE OF DISCHARGE:                                CONSULTATION   REQUESTING PHYSICIAN:  Jethro Bastos, MD  INDICATIONS:  Diarrhea.  HISTORY OF PRESENT ILLNESS:  [name] is a pleasant 55 year old black male who has a history of multiple myeloma that above the T8 vertebra and subsequently became paraplegic.  He has undergone surgery, radiation and currently is on chemotherapy in the form of Velcade cycles and once a month Zometa.  He also has been on dexamethasone.  He was in the hospital in April secondary to recurrent DVT and he was treated for that.  He notably had a therapeutic INR and despite that developed DVT. He had a IVC filter placed during that hospitalization.  He did have some loose bowel movements during that time and C. diff study at that time was negative.  He received a course of Avelox and Primaxin during that hospitalization and all cultures were negative and antibiotics were discontinued.  He reports that his diarrhea started 3 weeks ago, but he also thinks that he has been in the hospital since December.  C. diff PCR test on November 14, 2010 was negative.  He received a cycle of Velcade on Dec 05, 2010 and reportedly had nausea, vomiting and diarrhea late that afternoon.  He was given IV fluids as well as antiemetics and his symptoms improved until Dec 08, 2010 when he became weak, nausea and vomiting and had recurrence of diarrhea.  He reports profuse nonbloody/watery diarrhea with nausea and vomiting. Otherwise, he was unable to me details regarding the extent of his nausea and vomiting.  Since he is coming here, he denies any further nausea and vomiting and says his stool frequency has  decreased and nursing staff reports that his stool was a nonbloody mucous with small volume and not enough descent loose stools that he had.  The CT scan showed thickening in the distal colon specifically from the rectum to the distal descending colon consistent with long segment colitis. Additional findings can be found in Radiology record.  PAST MEDICAL HISTORY: 1. Multiple myeloma. 2. 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 3. Depression. 4. History of neurogenic bladder. 5. Adrenal insufficiency. 6. History of DVTs, requiring anticoagulation and IVC filter     placement. 7. History of anemia thought to be secondary to multiple myeloma and     chemotherapy. 8. History of cognitive impairment thought to be possibly due to     chemotherapy.  PAST SURGICAL HISTORY:  Reviewed on admission H and P and no additions to report.  FAMILY HISTORY:  Mother died of colon cancer in her 86s, otherwise negative from GI standpoint.  ALLERGIES:  No known drug allergies.  REVIEW OF SYSTEMS:  Negative from a GI standpoint except as stated above.  PHYSICAL EXAMINATION:  VITAL SIGNS:  Temperature 97.8, pulse 72, blood pressure 129/88. GENERAL:  Awake, alert, no acute distress. ABDOMEN:  Epigastric tenderness with guarding, left-sided tenderness with minimal guarding, soft, nondistended, positive bowel sounds.  LABORATORY DATA:  White blood count 10.1, hemoglobin 9.9, platelet count 102, INR 3.27, lipase 403.  Liver function tests within normal limits and were reviewed.  Additional labs listed in the hospital record were then reviewed.  IMPRESSION:  A 55 year old black male with multiple myeloma on chemotherapy who has had antibiotics in April and in the hospitalization at the time, being admitted secondary to weakness, nausea, vomiting, and diarrhea.  He does have pancreatitis based on his labs, although the CT scan shows no visible evidence of pancreatic inflammation and is probably the  source of his nausea and vomiting.  His diarrhea is most likely an infectious source, although ischemic colitis is still possible.  Main concern would be for Clostridium difficile infection versus Salmonella and less likely Giardia.  I doubt E. coli, Shigella, or Yersinia.  He has no bloody stools and no abdominal pain, which makes ischemic colitis less likely, although the CT findings could be due to ischemia versus infection.  PLAN: 1. Supportive care with IV fluids and follow electrolytes. 2. Await stool studies prior to starting antibiotics. 3. No indication to do a flexible sigmoidoscopy at this time. 4. I will follow along with you.     Shirley Friar, MD     VCS/MEDQ  D:  12/09/2010  T:  12/09/2010  Job:  [id number]  cc:   Jethro Bastos, M.D.  Electronically Signed by Charlott Rakes MD on 12/22/2010 11:36:52 PM

## 2010-12-23 LAB — COMPREHENSIVE METABOLIC PANEL
ALT: 30 U/L (ref 0–53)
AST: 22 U/L (ref 0–37)
Albumin: 3.4 g/dL — ABNORMAL LOW (ref 3.5–5.2)
Alkaline Phosphatase: 113 U/L (ref 39–117)
BUN: 10 mg/dL (ref 6–23)
CO2: 21 mEq/L (ref 19–32)
Calcium: 8.1 mg/dL — ABNORMAL LOW (ref 8.4–10.5)
Chloride: 102 mEq/L (ref 96–112)
Creatinine, Ser: 0.54 mg/dL (ref 0.40–1.50)
Glucose, Bld: 104 mg/dL — ABNORMAL HIGH (ref 70–99)
Potassium: 3.8 mEq/L (ref 3.5–5.3)
Sodium: 136 mEq/L (ref 135–145)
Total Bilirubin: 0.4 mg/dL (ref 0.3–1.2)
Total Protein: 5.8 g/dL — ABNORMAL LOW (ref 6.0–8.3)

## 2010-12-23 LAB — KAPPA/LAMBDA LIGHT CHAINS
Kappa free light chain: <0.66 mg/dL (ref 0.33–1.94)
Lambda Free Lght Chn: 0.45 mg/dL — ABNORMAL LOW (ref 0.57–2.63)

## 2010-12-23 LAB — BETA 2 MICROGLOBULIN, SERUM: Beta-2 Microglobulin: 1.59 mg/L (ref 1.01–1.73)

## 2010-12-23 LAB — IMMUNOFIXATION ELECTROPHORESIS
IgA: 30 mg/dL — ABNORMAL LOW (ref 68–378)
IgG (Immunoglobin G), Serum: 878 mg/dL (ref 694–1618)
IgM, Serum: 40 mg/dL — ABNORMAL LOW (ref 60–263)
Total Protein, Serum Electrophoresis: 5.8 g/dL — ABNORMAL LOW (ref 6.0–8.3)

## 2011-01-02 ENCOUNTER — Encounter (HOSPITAL_BASED_OUTPATIENT_CLINIC_OR_DEPARTMENT_OTHER): Payer: PRIVATE HEALTH INSURANCE | Admitting: Oncology

## 2011-01-02 ENCOUNTER — Other Ambulatory Visit: Payer: Self-pay | Admitting: Physician Assistant

## 2011-01-02 DIAGNOSIS — C9000 Multiple myeloma not having achieved remission: Secondary | ICD-10-CM

## 2011-01-02 DIAGNOSIS — Z5111 Encounter for antineoplastic chemotherapy: Secondary | ICD-10-CM

## 2011-01-02 DIAGNOSIS — Z5112 Encounter for antineoplastic immunotherapy: Secondary | ICD-10-CM

## 2011-01-02 DIAGNOSIS — Z7901 Long term (current) use of anticoagulants: Secondary | ICD-10-CM

## 2011-01-02 LAB — CBC WITH DIFFERENTIAL/PLATELET
BASO%: 0.2 % (ref 0.0–2.0)
Basophils Absolute: 0 10*3/uL (ref 0.0–0.1)
EOS%: 0.3 % (ref 0.0–7.0)
Eosinophils Absolute: 0 10*3/uL (ref 0.0–0.5)
HCT: 30.3 % — ABNORMAL LOW (ref 38.4–49.9)
HGB: 10.3 g/dL — ABNORMAL LOW (ref 13.0–17.1)
LYMPH%: 5.1 % — ABNORMAL LOW (ref 14.0–49.0)
MCH: 27.6 pg (ref 27.2–33.4)
MCHC: 34 g/dL (ref 32.0–36.0)
MCV: 81.2 fL (ref 79.3–98.0)
MONO#: 0.4 10*3/uL (ref 0.1–0.9)
MONO%: 3.4 % (ref 0.0–14.0)
NEUT#: 10.4 10*3/uL — ABNORMAL HIGH (ref 1.5–6.5)
NEUT%: 91 % — ABNORMAL HIGH (ref 39.0–75.0)
Platelets: 327 10*3/uL (ref 140–400)
RBC: 3.73 10*6/uL — ABNORMAL LOW (ref 4.20–5.82)
RDW: 18.8 % — ABNORMAL HIGH (ref 11.0–14.6)
WBC: 11.4 10*3/uL — ABNORMAL HIGH (ref 4.0–10.3)
lymph#: 0.6 10*3/uL — ABNORMAL LOW (ref 0.9–3.3)

## 2011-01-02 LAB — TECHNOLOGIST REVIEW

## 2011-01-02 LAB — PROTIME-INR
INR: 2.8 (ref 2.00–3.50)
Protime: 33.6 Seconds — ABNORMAL HIGH (ref 10.6–13.4)

## 2011-01-09 ENCOUNTER — Encounter (HOSPITAL_BASED_OUTPATIENT_CLINIC_OR_DEPARTMENT_OTHER): Payer: PRIVATE HEALTH INSURANCE | Admitting: Oncology

## 2011-01-09 ENCOUNTER — Other Ambulatory Visit: Payer: Self-pay | Admitting: Physician Assistant

## 2011-01-09 DIAGNOSIS — Z5112 Encounter for antineoplastic immunotherapy: Secondary | ICD-10-CM

## 2011-01-09 DIAGNOSIS — Z5111 Encounter for antineoplastic chemotherapy: Secondary | ICD-10-CM

## 2011-01-09 DIAGNOSIS — C9000 Multiple myeloma not having achieved remission: Secondary | ICD-10-CM

## 2011-01-09 DIAGNOSIS — Z7901 Long term (current) use of anticoagulants: Secondary | ICD-10-CM

## 2011-01-09 LAB — CBC WITH DIFFERENTIAL/PLATELET
BASO%: 0.6 % (ref 0.0–2.0)
Basophils Absolute: 0.1 10*3/uL (ref 0.0–0.1)
EOS%: 2.4 % (ref 0.0–7.0)
Eosinophils Absolute: 0.2 10*3/uL (ref 0.0–0.5)
HCT: 31.4 % — ABNORMAL LOW (ref 38.4–49.9)
HGB: 10.4 g/dL — ABNORMAL LOW (ref 13.0–17.1)
LYMPH%: 8.6 % — ABNORMAL LOW (ref 14.0–49.0)
MCH: 28.8 pg (ref 27.2–33.4)
MCHC: 33.2 g/dL (ref 32.0–36.0)
MCV: 86.8 fL (ref 79.3–98.0)
MONO#: 0.9 10*3/uL (ref 0.1–0.9)
MONO%: 9.2 % (ref 0.0–14.0)
NEUT#: 7.4 10*3/uL — ABNORMAL HIGH (ref 1.5–6.5)
NEUT%: 79.2 % — ABNORMAL HIGH (ref 39.0–75.0)
Platelets: 271 10*3/uL (ref 140–400)
RBC: 3.62 10*6/uL — ABNORMAL LOW (ref 4.20–5.82)
RDW: 21.1 % — ABNORMAL HIGH (ref 11.0–14.6)
WBC: 9.3 10*3/uL (ref 4.0–10.3)
lymph#: 0.8 10*3/uL — ABNORMAL LOW (ref 0.9–3.3)

## 2011-01-09 LAB — PROTIME-INR
INR: 3.8 — ABNORMAL HIGH (ref 2.00–3.50)
Protime: 45.6 Seconds — ABNORMAL HIGH (ref 10.6–13.4)

## 2011-01-20 NOTE — Discharge Summary (Signed)
[name], [name]               ACCOUNT NO.:  [id number]  MEDICAL RECORD NO.:  [id number]           PATIENT TYPE:  I  LOCATION:  5526                         FACILITY:  [facility]  PHYSICIAN:  Jethro Bastos, M.D.DATE OF BIRTH:  July 03, 1956  DATE OF ADMISSION:  12/08/2010 DATE OF DISCHARGE:  12/12/2010                              DISCHARGE SUMMARY   ADMISSION DIAGNOSES: 1. Dehydration. 2. Diarrhea and vomiting. 3. Adrenal insufficiency.  DISCHARGE DIAGNOSES: 1. Dehydration, resolved. 2. Diarrhea secondary to Clostridium difficile, improved. 3. Acute adrenal insufficiency. 4. Multiple myeloma, status post T8 lesion with paraplegia. 5. History of deep vein thrombosis, on chronic anticoagulation.  CONSULTATIONS:  Shirley Friar, MD for Gastroenterology.  PROCEDURES:  None.  COMPLICATIONS:  None.  HISTORY OF PRESENT ILLNESS:  Briefly, this is a 55 year old male with multiple myeloma diagnosed in December 2011.  He had a T8 lesion which resulted in total paralysis from that level down.  He was left with paraplegia, neurogenic bladder, and neurogenic colon.  He was admitted to the hospital 1 month ago for DVT, but also had some symptoms of fever.  Was given short courses of intravenous antibiotics, but those were discontinued.  On Monday Dec 05, 2010, he developed severe vomiting and diarrhea.  This symptoms persisted and he was diagnosed with dehydration and was admitted to the hospital on the 17th.  He had also been on chronic steroid therapy for his myeloma and was also felt to be adrenally insufficient.  He was admitted to the hospital.  His admission lab work showed that his CBCs showed a hemoglobin of 9.9 grams which was his baseline.  His white count was 10,100 and platelet count was slightly low at 102,000.  His CMP was normal except for an albumin of 2.1 and a calcium was 7.6.  His amylase that was elevated at 244 and his lipase was also elevated at 403.  Chest  x-ray on admission showed some low lung volumes, possible atelectasis.  CT scan of the abdomen showed inflamed large bowel from the distal colon to the rectum compatible with long segmental colitis.  Had infectious etiology was favored.  There was also small volume of lower abdominal and pelvic free fluid without organized abscess.  Small pleural effusions with lung base atelectasis and abnormal bone mineralization compatible with widespread multiple myeloma.  An IVC filter was noted and he was status post T8 surgery and fusion.  COURSE IN THE HOSPITAL:  The patient was admitted and placed on IV fluids.  He was felt to be acutely adrenally insufficient and he was given intravenous hydrocortisone.  The above studies were performed.  By the next day, Dec 09, 2010, he was much improved.  The CT scan showing colitis was consistent with his clinical findings.  C.  Difficile was felt to be the most likely diagnosis.  GI consult was obtained.  Stool testing was performed.  By the next day, May 19th, the stool for C.difficile test turned positive.  He was started on metronidazole.  This was given intravenously at first, then changed to oral metronidazole on the day of discharge, May 21st.  During his hospitalization, she rehydrated nicely and his IV fluids were gradually decreased and discontinued.  His hydrocortisone dose was also decreased and switched to oral dexamethasone.  His INR is continued to be somewhat elevated initially at 3.27 rising up over 4.50.  This was felt to be related to the interaction between Coumadin and Septra DS that he was on chronically.  An incidental UTI was noted, but because he is chronically catheterized he was not felt to be worth treating.  He was discharged on the Dec 12, 2010, home much improved.  DISCHARGE MEDICATIONS:  Include 1. Acyclovir 400 mg daily. 2. Warfarin 2.5 mg daily (it is anticipated that this will be     increased as he finished up a 14-day  course of metronidazole). 3. Metronidazole 500 mg t.i.d. for 14 days total. 4. Methocarbamol 500 mg every 6 hours p.r.n. muscle spasm. 5. Oxycodone 5 mg IR q.4 h. p.r.n. pain. 6. Protonix 40 mg daily. 7. Trimethoprim sulfamethoxazole double strength 1 tablet Monday,     Wednesday, Friday. 8. Revlimid 15 mg nightly. 9. Dexamethasone 4 mg daily. 10.Macrobid which he had been on chronically was discontinued.          ______________________________ Jethro Bastos, M.D.     RNK/MEDQ  D:  12/12/2010  T:  12/13/2010  Job:  [id number]  cc:   Shirley Friar, MD Pierce Crane, M.D., F.R.C.P.C.  Electronically Signed by Marny Lowenstein M.D. on 01/20/2011 01:11:50 PM

## 2011-01-26 ENCOUNTER — Encounter (HOSPITAL_BASED_OUTPATIENT_CLINIC_OR_DEPARTMENT_OTHER): Payer: Medicaid Other | Admitting: Oncology

## 2011-01-26 ENCOUNTER — Other Ambulatory Visit: Payer: Self-pay | Admitting: Physician Assistant

## 2011-01-26 DIAGNOSIS — Z5111 Encounter for antineoplastic chemotherapy: Secondary | ICD-10-CM

## 2011-01-26 DIAGNOSIS — I82409 Acute embolism and thrombosis of unspecified deep veins of unspecified lower extremity: Secondary | ICD-10-CM

## 2011-01-26 DIAGNOSIS — C9000 Multiple myeloma not having achieved remission: Secondary | ICD-10-CM

## 2011-01-26 LAB — COMPREHENSIVE METABOLIC PANEL
ALT: 63 U/L — ABNORMAL HIGH (ref 0–53)
AST: 29 U/L (ref 0–37)
Albumin: 3.1 g/dL — ABNORMAL LOW (ref 3.5–5.2)
Alkaline Phosphatase: 95 U/L (ref 39–117)
BUN: 12 mg/dL (ref 6–23)
CO2: 22 mEq/L (ref 19–32)
Calcium: 8.8 mg/dL (ref 8.4–10.5)
Chloride: 102 mEq/L (ref 96–112)
Creatinine, Ser: 0.82 mg/dL (ref 0.50–1.35)
Glucose, Bld: 139 mg/dL — ABNORMAL HIGH (ref 70–99)
Potassium: 4.2 mEq/L (ref 3.5–5.3)
Sodium: 138 mEq/L (ref 135–145)
Total Bilirubin: 0.4 mg/dL (ref 0.3–1.2)
Total Protein: 6.2 g/dL (ref 6.0–8.3)

## 2011-01-26 LAB — CBC WITH DIFFERENTIAL/PLATELET
BASO%: 0.2 % (ref 0.0–2.0)
Basophils Absolute: 0 10*3/uL (ref 0.0–0.1)
EOS%: 0.2 % (ref 0.0–7.0)
Eosinophils Absolute: 0 10*3/uL (ref 0.0–0.5)
HCT: 32.3 % — ABNORMAL LOW (ref 38.4–49.9)
HGB: 11.1 g/dL — ABNORMAL LOW (ref 13.0–17.1)
LYMPH%: 5.1 % — ABNORMAL LOW (ref 14.0–49.0)
MCH: 28.2 pg (ref 27.2–33.4)
MCHC: 34.4 g/dL (ref 32.0–36.0)
MCV: 82 fL (ref 79.3–98.0)
MONO#: 0.3 10*3/uL (ref 0.1–0.9)
MONO%: 2.7 % (ref 0.0–14.0)
NEUT#: 11.5 10*3/uL — ABNORMAL HIGH (ref 1.5–6.5)
NEUT%: 91.8 % — ABNORMAL HIGH (ref 39.0–75.0)
Platelets: 359 10*3/uL (ref 140–400)
RBC: 3.94 10*6/uL — ABNORMAL LOW (ref 4.20–5.82)
RDW: 18.7 % — ABNORMAL HIGH (ref 11.0–14.6)
WBC: 12.5 10*3/uL — ABNORMAL HIGH (ref 4.0–10.3)
lymph#: 0.6 10*3/uL — ABNORMAL LOW (ref 0.9–3.3)

## 2011-01-26 LAB — PROTIME-INR
INR: 2.7 (ref 2.00–3.50)
Protime: 32.4 Seconds — ABNORMAL HIGH (ref 10.6–13.4)

## 2011-01-26 LAB — TECHNOLOGIST REVIEW

## 2011-01-30 LAB — IMMUNOFIXATION ELECTROPHORESIS
IgA: 58 mg/dL — ABNORMAL LOW (ref 68–379)
IgG (Immunoglobin G), Serum: 787 mg/dL (ref 650–1600)
IgM, Serum: 22 mg/dL — ABNORMAL LOW (ref 41–251)
Total Protein, Serum Electrophoresis: 6.3 g/dL (ref 6.0–8.3)

## 2011-01-30 LAB — BETA 2 MICROGLOBULIN, SERUM: Beta-2 Microglobulin: 1.38 mg/L (ref 1.01–1.73)

## 2011-02-02 ENCOUNTER — Encounter (HOSPITAL_BASED_OUTPATIENT_CLINIC_OR_DEPARTMENT_OTHER): Payer: Medicaid Other | Admitting: Oncology

## 2011-02-02 ENCOUNTER — Other Ambulatory Visit: Payer: Self-pay | Admitting: Physician Assistant

## 2011-02-02 DIAGNOSIS — C9000 Multiple myeloma not having achieved remission: Secondary | ICD-10-CM

## 2011-02-02 DIAGNOSIS — I82409 Acute embolism and thrombosis of unspecified deep veins of unspecified lower extremity: Secondary | ICD-10-CM

## 2011-02-02 LAB — CBC WITH DIFFERENTIAL/PLATELET
BASO%: 0.3 % (ref 0.0–2.0)
Basophils Absolute: 0 10*3/uL (ref 0.0–0.1)
EOS%: 0.1 % (ref 0.0–7.0)
Eosinophils Absolute: 0 10*3/uL (ref 0.0–0.5)
HCT: 34.7 % — ABNORMAL LOW (ref 38.4–49.9)
HGB: 12.1 g/dL — ABNORMAL LOW (ref 13.0–17.1)
LYMPH%: 7.6 % — ABNORMAL LOW (ref 14.0–49.0)
MCH: 28.3 pg (ref 27.2–33.4)
MCHC: 34.9 g/dL (ref 32.0–36.0)
MCV: 81.3 fL (ref 79.3–98.0)
MONO#: 0.3 10*3/uL (ref 0.1–0.9)
MONO%: 3.5 % (ref 0.0–14.0)
NEUT#: 8 10*3/uL — ABNORMAL HIGH (ref 1.5–6.5)
NEUT%: 88.5 % — ABNORMAL HIGH (ref 39.0–75.0)
Platelets: 302 10*3/uL (ref 140–400)
RBC: 4.27 10*6/uL (ref 4.20–5.82)
RDW: 18.5 % — ABNORMAL HIGH (ref 11.0–14.6)
WBC: 9.1 10*3/uL (ref 4.0–10.3)
lymph#: 0.7 10*3/uL — ABNORMAL LOW (ref 0.9–3.3)
nRBC: 0 % (ref 0–0)

## 2011-02-02 LAB — PROTIME-INR
INR: 3.5 (ref 2.00–3.50)
Protime: 42 Seconds — ABNORMAL HIGH (ref 10.6–13.4)

## 2011-02-08 ENCOUNTER — Encounter (HOSPITAL_BASED_OUTPATIENT_CLINIC_OR_DEPARTMENT_OTHER): Payer: PRIVATE HEALTH INSURANCE | Admitting: Oncology

## 2011-02-08 ENCOUNTER — Other Ambulatory Visit: Payer: Self-pay | Admitting: Physician Assistant

## 2011-02-08 DIAGNOSIS — Z5181 Encounter for therapeutic drug level monitoring: Secondary | ICD-10-CM

## 2011-02-08 DIAGNOSIS — Z5111 Encounter for antineoplastic chemotherapy: Secondary | ICD-10-CM

## 2011-02-08 DIAGNOSIS — Z7901 Long term (current) use of anticoagulants: Secondary | ICD-10-CM

## 2011-02-08 DIAGNOSIS — C9000 Multiple myeloma not having achieved remission: Secondary | ICD-10-CM

## 2011-02-08 LAB — CBC WITH DIFFERENTIAL/PLATELET
BASO%: 0.3 % (ref 0.0–2.0)
Basophils Absolute: 0 10*3/uL (ref 0.0–0.1)
EOS%: 0.3 % (ref 0.0–7.0)
Eosinophils Absolute: 0 10*3/uL (ref 0.0–0.5)
HCT: 33.1 % — ABNORMAL LOW (ref 38.4–49.9)
HGB: 11.6 g/dL — ABNORMAL LOW (ref 13.0–17.1)
LYMPH%: 7.1 % — ABNORMAL LOW (ref 14.0–49.0)
MCH: 29.1 pg (ref 27.2–33.4)
MCHC: 35 g/dL (ref 32.0–36.0)
MCV: 83 fL (ref 79.3–98.0)
MONO#: 0.9 10*3/uL (ref 0.1–0.9)
MONO%: 5.9 % (ref 0.0–14.0)
NEUT#: 13.2 10*3/uL — ABNORMAL HIGH (ref 1.5–6.5)
NEUT%: 86.4 % — ABNORMAL HIGH (ref 39.0–75.0)
Platelets: 246 10*3/uL (ref 140–400)
RBC: 3.99 10*6/uL — ABNORMAL LOW (ref 4.20–5.82)
RDW: 18.7 % — ABNORMAL HIGH (ref 11.0–14.6)
WBC: 15.3 10*3/uL — ABNORMAL HIGH (ref 4.0–10.3)
lymph#: 1.1 10*3/uL (ref 0.9–3.3)
nRBC: 0 % (ref 0–0)

## 2011-02-08 LAB — PROTIME-INR
INR: 2.1 (ref 2.00–3.50)
Protime: 25.2 Seconds — ABNORMAL HIGH (ref 10.6–13.4)

## 2011-02-08 LAB — TECHNOLOGIST REVIEW

## 2011-02-18 ENCOUNTER — Encounter: Payer: Self-pay | Admitting: Family Medicine

## 2011-02-22 ENCOUNTER — Other Ambulatory Visit: Payer: Self-pay | Admitting: Physician Assistant

## 2011-02-22 ENCOUNTER — Encounter (HOSPITAL_BASED_OUTPATIENT_CLINIC_OR_DEPARTMENT_OTHER): Payer: PRIVATE HEALTH INSURANCE | Admitting: Oncology

## 2011-02-22 DIAGNOSIS — I82409 Acute embolism and thrombosis of unspecified deep veins of unspecified lower extremity: Secondary | ICD-10-CM

## 2011-02-22 DIAGNOSIS — Z5111 Encounter for antineoplastic chemotherapy: Secondary | ICD-10-CM

## 2011-02-22 DIAGNOSIS — Z5112 Encounter for antineoplastic immunotherapy: Secondary | ICD-10-CM

## 2011-02-22 DIAGNOSIS — C9000 Multiple myeloma not having achieved remission: Secondary | ICD-10-CM

## 2011-02-22 LAB — CBC WITH DIFFERENTIAL/PLATELET
BASO%: 0.2 % (ref 0.0–2.0)
Basophils Absolute: 0 10*3/uL (ref 0.0–0.1)
EOS%: 0.2 % (ref 0.0–7.0)
Eosinophils Absolute: 0 10*3/uL (ref 0.0–0.5)
HCT: 32.3 % — ABNORMAL LOW (ref 38.4–49.9)
HGB: 11.4 g/dL — ABNORMAL LOW (ref 13.0–17.1)
LYMPH%: 5.2 % — ABNORMAL LOW (ref 14.0–49.0)
MCH: 29.2 pg (ref 27.2–33.4)
MCHC: 35.3 g/dL (ref 32.0–36.0)
MCV: 82.6 fL (ref 79.3–98.0)
MONO#: 1.2 10*3/uL — ABNORMAL HIGH (ref 0.1–0.9)
MONO%: 9.4 % (ref 0.0–14.0)
NEUT#: 10.5 10*3/uL — ABNORMAL HIGH (ref 1.5–6.5)
NEUT%: 85 % — ABNORMAL HIGH (ref 39.0–75.0)
Platelets: 399 10*3/uL (ref 140–400)
RBC: 3.91 10*6/uL — ABNORMAL LOW (ref 4.20–5.82)
RDW: 17.5 % — ABNORMAL HIGH (ref 11.0–14.6)
WBC: 12.3 10*3/uL — ABNORMAL HIGH (ref 4.0–10.3)
lymph#: 0.6 10*3/uL — ABNORMAL LOW (ref 0.9–3.3)
nRBC: 0 % (ref 0–0)

## 2011-02-22 LAB — COMPREHENSIVE METABOLIC PANEL
ALT: 57 U/L — ABNORMAL HIGH (ref 0–53)
AST: 18 U/L (ref 0–37)
Albumin: 3.2 g/dL — ABNORMAL LOW (ref 3.5–5.2)
Alkaline Phosphatase: 97 U/L (ref 39–117)
BUN: 11 mg/dL (ref 6–23)
CO2: 26 mEq/L (ref 19–32)
Calcium: 9.2 mg/dL (ref 8.4–10.5)
Chloride: 101 mEq/L (ref 96–112)
Creatinine, Ser: 0.51 mg/dL (ref 0.50–1.35)
Glucose, Bld: 130 mg/dL — ABNORMAL HIGH (ref 70–99)
Potassium: 4 mEq/L (ref 3.5–5.3)
Sodium: 138 mEq/L (ref 135–145)
Total Bilirubin: 0.2 mg/dL — ABNORMAL LOW (ref 0.3–1.2)
Total Protein: 6.8 g/dL (ref 6.0–8.3)

## 2011-02-22 LAB — PROTIME-INR
INR: 3.8 — ABNORMAL HIGH (ref 2.00–3.50)
Protime: 45.6 Seconds — ABNORMAL HIGH (ref 10.6–13.4)

## 2011-02-24 LAB — KAPPA/LAMBDA LIGHT CHAINS
Kappa free light chain: 0.17 mg/dL — ABNORMAL LOW (ref 0.33–1.94)
Kappa:Lambda Ratio: 4.25 — ABNORMAL HIGH (ref 0.26–1.65)
Lambda Free Lght Chn: 0.04 mg/dL — ABNORMAL LOW (ref 0.57–2.63)

## 2011-02-24 LAB — BETA 2 MICROGLOBULIN, SERUM: Beta-2 Microglobulin: 1.45 mg/L (ref 1.01–1.73)

## 2011-02-24 LAB — IMMUNOFIXATION ELECTROPHORESIS
IgA: 50 mg/dL — ABNORMAL LOW (ref 68–379)
IgG (Immunoglobin G), Serum: 581 mg/dL — ABNORMAL LOW (ref 650–1600)
IgM, Serum: 24 mg/dL — ABNORMAL LOW (ref 41–251)
Total Protein, Serum Electrophoresis: 6.4 g/dL (ref 6.0–8.3)

## 2011-03-01 ENCOUNTER — Encounter (HOSPITAL_BASED_OUTPATIENT_CLINIC_OR_DEPARTMENT_OTHER): Payer: PRIVATE HEALTH INSURANCE | Admitting: Oncology

## 2011-03-01 ENCOUNTER — Other Ambulatory Visit: Payer: Self-pay | Admitting: Physician Assistant

## 2011-03-01 DIAGNOSIS — Z5112 Encounter for antineoplastic immunotherapy: Secondary | ICD-10-CM

## 2011-03-01 DIAGNOSIS — Z7901 Long term (current) use of anticoagulants: Secondary | ICD-10-CM

## 2011-03-01 DIAGNOSIS — Z5111 Encounter for antineoplastic chemotherapy: Secondary | ICD-10-CM

## 2011-03-01 DIAGNOSIS — C9000 Multiple myeloma not having achieved remission: Secondary | ICD-10-CM

## 2011-03-01 LAB — CBC WITH DIFFERENTIAL/PLATELET
BASO%: 0.5 % (ref 0.0–2.0)
Basophils Absolute: 0.1 10*3/uL (ref 0.0–0.1)
EOS%: 0.2 % (ref 0.0–7.0)
Eosinophils Absolute: 0 10*3/uL (ref 0.0–0.5)
HCT: 34.2 % — ABNORMAL LOW (ref 38.4–49.9)
HGB: 11.9 g/dL — ABNORMAL LOW (ref 13.0–17.1)
LYMPH%: 4.4 % — ABNORMAL LOW (ref 14.0–49.0)
MCH: 29.5 pg (ref 27.2–33.4)
MCHC: 34.8 g/dL (ref 32.0–36.0)
MCV: 84.9 fL (ref 79.3–98.0)
MONO#: 0.3 10*3/uL (ref 0.1–0.9)
MONO%: 2.8 % (ref 0.0–14.0)
NEUT#: 9.6 10*3/uL — ABNORMAL HIGH (ref 1.5–6.5)
NEUT%: 92.1 % — ABNORMAL HIGH (ref 39.0–75.0)
Platelets: 289 10*3/uL (ref 140–400)
RBC: 4.03 10*6/uL — ABNORMAL LOW (ref 4.20–5.82)
RDW: 17.2 % — ABNORMAL HIGH (ref 11.0–14.6)
WBC: 10.4 10*3/uL — ABNORMAL HIGH (ref 4.0–10.3)
lymph#: 0.5 10*3/uL — ABNORMAL LOW (ref 0.9–3.3)
nRBC: 0 % (ref 0–0)

## 2011-03-01 LAB — PROTIME-INR
INR: 3.1 (ref 2.00–3.50)
Protime: 37.2 Seconds — ABNORMAL HIGH (ref 10.6–13.4)

## 2011-03-01 LAB — TECHNOLOGIST REVIEW

## 2011-03-08 ENCOUNTER — Encounter (HOSPITAL_BASED_OUTPATIENT_CLINIC_OR_DEPARTMENT_OTHER): Payer: PRIVATE HEALTH INSURANCE | Admitting: Oncology

## 2011-03-08 ENCOUNTER — Other Ambulatory Visit: Payer: Self-pay | Admitting: Physician Assistant

## 2011-03-08 DIAGNOSIS — C9000 Multiple myeloma not having achieved remission: Secondary | ICD-10-CM

## 2011-03-08 DIAGNOSIS — Z5112 Encounter for antineoplastic immunotherapy: Secondary | ICD-10-CM

## 2011-03-08 DIAGNOSIS — I82409 Acute embolism and thrombosis of unspecified deep veins of unspecified lower extremity: Secondary | ICD-10-CM

## 2011-03-08 DIAGNOSIS — Z5111 Encounter for antineoplastic chemotherapy: Secondary | ICD-10-CM

## 2011-03-08 LAB — CBC WITH DIFFERENTIAL/PLATELET
BASO%: 1.1 % (ref 0.0–2.0)
Basophils Absolute: 0.1 10*3/uL (ref 0.0–0.1)
EOS%: 0.5 % (ref 0.0–7.0)
Eosinophils Absolute: 0.1 10*3/uL (ref 0.0–0.5)
HCT: 36.5 % — ABNORMAL LOW (ref 38.4–49.9)
HGB: 12.8 g/dL — ABNORMAL LOW (ref 13.0–17.1)
LYMPH%: 4.1 % — ABNORMAL LOW (ref 14.0–49.0)
MCH: 29.8 pg (ref 27.2–33.4)
MCHC: 35.1 g/dL (ref 32.0–36.0)
MCV: 85.1 fL (ref 79.3–98.0)
MONO#: 0.4 10*3/uL (ref 0.1–0.9)
MONO%: 3.5 % (ref 0.0–14.0)
NEUT#: 11.1 10*3/uL — ABNORMAL HIGH (ref 1.5–6.5)
NEUT%: 90.8 % — ABNORMAL HIGH (ref 39.0–75.0)
Platelets: 261 10*3/uL (ref 140–400)
RBC: 4.29 10*6/uL (ref 4.20–5.82)
RDW: 17.2 % — ABNORMAL HIGH (ref 11.0–14.6)
WBC: 12.2 10*3/uL — ABNORMAL HIGH (ref 4.0–10.3)
lymph#: 0.5 10*3/uL — ABNORMAL LOW (ref 0.9–3.3)

## 2011-03-08 LAB — TECHNOLOGIST REVIEW

## 2011-03-08 LAB — PROTIME-INR
INR: 2.4 (ref 2.00–3.50)
Protime: 28.8 Seconds — ABNORMAL HIGH (ref 10.6–13.4)

## 2011-03-22 ENCOUNTER — Other Ambulatory Visit: Payer: Self-pay | Admitting: Physician Assistant

## 2011-03-22 ENCOUNTER — Encounter (HOSPITAL_BASED_OUTPATIENT_CLINIC_OR_DEPARTMENT_OTHER): Payer: PRIVATE HEALTH INSURANCE | Admitting: Oncology

## 2011-03-22 DIAGNOSIS — C9000 Multiple myeloma not having achieved remission: Secondary | ICD-10-CM

## 2011-03-22 DIAGNOSIS — Z5112 Encounter for antineoplastic immunotherapy: Secondary | ICD-10-CM

## 2011-03-22 LAB — CBC WITH DIFFERENTIAL/PLATELET
BASO%: 0 % (ref 0.0–2.0)
Basophils Absolute: 0 10*3/uL (ref 0.0–0.1)
EOS%: 0.2 % (ref 0.0–7.0)
Eosinophils Absolute: 0 10*3/uL (ref 0.0–0.5)
HCT: 37 % — ABNORMAL LOW (ref 38.4–49.9)
HGB: 12.6 g/dL — ABNORMAL LOW (ref 13.0–17.1)
LYMPH%: 3.5 % — ABNORMAL LOW (ref 14.0–49.0)
MCH: 30.5 pg (ref 27.2–33.4)
MCHC: 33.9 g/dL (ref 32.0–36.0)
MCV: 89.9 fL (ref 79.3–98.0)
MONO#: 0.3 10*3/uL (ref 0.1–0.9)
MONO%: 2 % (ref 0.0–14.0)
NEUT#: 12 10*3/uL — ABNORMAL HIGH (ref 1.5–6.5)
NEUT%: 94.3 % — ABNORMAL HIGH (ref 39.0–75.0)
Platelets: 379 10*3/uL (ref 140–400)
RBC: 4.12 10*6/uL — ABNORMAL LOW (ref 4.20–5.82)
RDW: 17.9 % — ABNORMAL HIGH (ref 11.0–14.6)
WBC: 12.8 10*3/uL — ABNORMAL HIGH (ref 4.0–10.3)
lymph#: 0.5 10*3/uL — ABNORMAL LOW (ref 0.9–3.3)

## 2011-03-22 LAB — PROTIME-INR
INR: 3.7 — ABNORMAL HIGH (ref 2.00–3.50)
Protime: 44.4 Seconds — ABNORMAL HIGH (ref 10.6–13.4)

## 2011-03-22 LAB — TECHNOLOGIST REVIEW

## 2011-03-24 LAB — COMPREHENSIVE METABOLIC PANEL
ALT: 47 U/L (ref 0–53)
AST: 20 U/L (ref 0–37)
Albumin: 3.9 g/dL (ref 3.5–5.2)
Alkaline Phosphatase: 75 U/L (ref 39–117)
BUN: 15 mg/dL (ref 6–23)
CO2: 20 mEq/L (ref 19–32)
Calcium: 8.8 mg/dL (ref 8.4–10.5)
Chloride: 106 mEq/L (ref 96–112)
Creatinine, Ser: 0.61 mg/dL (ref 0.50–1.35)
Glucose, Bld: 107 mg/dL — ABNORMAL HIGH (ref 70–99)
Potassium: 3.9 mEq/L (ref 3.5–5.3)
Sodium: 140 mEq/L (ref 135–145)
Total Bilirubin: 0.2 mg/dL — ABNORMAL LOW (ref 0.3–1.2)
Total Protein: 5.9 g/dL — ABNORMAL LOW (ref 6.0–8.3)

## 2011-03-24 LAB — IMMUNOFIXATION ELECTROPHORESIS
IgA: 44 mg/dL — ABNORMAL LOW (ref 68–379)
IgG (Immunoglobin G), Serum: 496 mg/dL — ABNORMAL LOW (ref 650–1600)
IgM, Serum: 26 mg/dL — ABNORMAL LOW (ref 41–251)
Total Protein, Serum Electrophoresis: 5.9 g/dL — ABNORMAL LOW (ref 6.0–8.3)

## 2011-03-24 LAB — BETA 2 MICROGLOBULIN, SERUM: Beta-2 Microglobulin: 1.21 mg/L (ref 1.01–1.73)

## 2011-03-29 ENCOUNTER — Encounter (HOSPITAL_BASED_OUTPATIENT_CLINIC_OR_DEPARTMENT_OTHER): Payer: PRIVATE HEALTH INSURANCE | Admitting: Oncology

## 2011-03-29 ENCOUNTER — Other Ambulatory Visit: Payer: Self-pay | Admitting: Physician Assistant

## 2011-03-29 DIAGNOSIS — Z5111 Encounter for antineoplastic chemotherapy: Secondary | ICD-10-CM

## 2011-03-29 DIAGNOSIS — I82409 Acute embolism and thrombosis of unspecified deep veins of unspecified lower extremity: Secondary | ICD-10-CM

## 2011-03-29 DIAGNOSIS — Z5112 Encounter for antineoplastic immunotherapy: Secondary | ICD-10-CM

## 2011-03-29 DIAGNOSIS — C9000 Multiple myeloma not having achieved remission: Secondary | ICD-10-CM

## 2011-03-29 LAB — CBC WITH DIFFERENTIAL/PLATELET
BASO%: 0.8 % (ref 0.0–2.0)
Basophils Absolute: 0.1 10*3/uL (ref 0.0–0.1)
EOS%: 0.1 % (ref 0.0–7.0)
Eosinophils Absolute: 0 10*3/uL (ref 0.0–0.5)
HCT: 36.5 % — ABNORMAL LOW (ref 38.4–49.9)
HGB: 13 g/dL (ref 13.0–17.1)
LYMPH%: 3.5 % — ABNORMAL LOW (ref 14.0–49.0)
MCH: 29.3 pg (ref 27.2–33.4)
MCHC: 35.6 g/dL (ref 32.0–36.0)
MCV: 82.2 fL (ref 79.3–98.0)
MONO#: 0.3 10*3/uL (ref 0.1–0.9)
MONO%: 1.9 % (ref 0.0–14.0)
NEUT#: 13.5 10*3/uL — ABNORMAL HIGH (ref 1.5–6.5)
NEUT%: 93.7 % — ABNORMAL HIGH (ref 39.0–75.0)
Platelets: 285 10*3/uL (ref 140–400)
RBC: 4.44 10*6/uL (ref 4.20–5.82)
RDW: 16.2 % — ABNORMAL HIGH (ref 11.0–14.6)
WBC: 14.4 10*3/uL — ABNORMAL HIGH (ref 4.0–10.3)
lymph#: 0.5 10*3/uL — ABNORMAL LOW (ref 0.9–3.3)
nRBC: 0 % (ref 0–0)

## 2011-03-29 LAB — PROTIME-INR
INR: 3.8 — ABNORMAL HIGH (ref 2.00–3.50)
Protime: 45.6 Seconds — ABNORMAL HIGH (ref 10.6–13.4)

## 2011-03-29 LAB — TECHNOLOGIST REVIEW

## 2011-04-05 ENCOUNTER — Other Ambulatory Visit: Payer: Self-pay | Admitting: Physician Assistant

## 2011-04-05 ENCOUNTER — Encounter (HOSPITAL_BASED_OUTPATIENT_CLINIC_OR_DEPARTMENT_OTHER): Payer: PRIVATE HEALTH INSURANCE | Admitting: Oncology

## 2011-04-05 DIAGNOSIS — Z5111 Encounter for antineoplastic chemotherapy: Secondary | ICD-10-CM

## 2011-04-05 DIAGNOSIS — C9000 Multiple myeloma not having achieved remission: Secondary | ICD-10-CM

## 2011-04-05 DIAGNOSIS — Z5112 Encounter for antineoplastic immunotherapy: Secondary | ICD-10-CM

## 2011-04-05 DIAGNOSIS — I82409 Acute embolism and thrombosis of unspecified deep veins of unspecified lower extremity: Secondary | ICD-10-CM

## 2011-04-05 DIAGNOSIS — Z7901 Long term (current) use of anticoagulants: Secondary | ICD-10-CM

## 2011-04-05 LAB — PROTIME-INR
INR: 4.4 — ABNORMAL HIGH (ref 2.00–3.50)
Protime: 52.8 Seconds — ABNORMAL HIGH (ref 10.6–13.4)

## 2011-04-05 LAB — TECHNOLOGIST REVIEW

## 2011-04-05 LAB — CBC WITH DIFFERENTIAL/PLATELET
BASO%: 0.6 % (ref 0.0–2.0)
Basophils Absolute: 0.1 10*3/uL (ref 0.0–0.1)
EOS%: 0.4 % (ref 0.0–7.0)
Eosinophils Absolute: 0.1 10*3/uL (ref 0.0–0.5)
HCT: 36.5 % — ABNORMAL LOW (ref 38.4–49.9)
HGB: 13.1 g/dL (ref 13.0–17.1)
LYMPH%: 3.6 % — ABNORMAL LOW (ref 14.0–49.0)
MCH: 29.5 pg (ref 27.2–33.4)
MCHC: 35.9 g/dL (ref 32.0–36.0)
MCV: 82.2 fL (ref 79.3–98.0)
MONO#: 0.4 10*3/uL (ref 0.1–0.9)
MONO%: 3.2 % (ref 0.0–14.0)
NEUT#: 11.7 10*3/uL — ABNORMAL HIGH (ref 1.5–6.5)
NEUT%: 92.2 % — ABNORMAL HIGH (ref 39.0–75.0)
Platelets: 251 10*3/uL (ref 140–400)
RBC: 4.44 10*6/uL (ref 4.20–5.82)
RDW: 16.1 % — ABNORMAL HIGH (ref 11.0–14.6)
WBC: 12.7 10*3/uL — ABNORMAL HIGH (ref 4.0–10.3)
lymph#: 0.5 10*3/uL — ABNORMAL LOW (ref 0.9–3.3)
nRBC: 0 % (ref 0–0)

## 2011-04-19 ENCOUNTER — Other Ambulatory Visit: Payer: Self-pay | Admitting: Physician Assistant

## 2011-04-19 ENCOUNTER — Encounter (HOSPITAL_BASED_OUTPATIENT_CLINIC_OR_DEPARTMENT_OTHER): Payer: PRIVATE HEALTH INSURANCE | Admitting: Oncology

## 2011-04-19 DIAGNOSIS — C9000 Multiple myeloma not having achieved remission: Secondary | ICD-10-CM

## 2011-04-19 DIAGNOSIS — Z5112 Encounter for antineoplastic immunotherapy: Secondary | ICD-10-CM

## 2011-04-19 DIAGNOSIS — I82409 Acute embolism and thrombosis of unspecified deep veins of unspecified lower extremity: Secondary | ICD-10-CM

## 2011-04-19 DIAGNOSIS — Z7901 Long term (current) use of anticoagulants: Secondary | ICD-10-CM

## 2011-04-19 DIAGNOSIS — Z5111 Encounter for antineoplastic chemotherapy: Secondary | ICD-10-CM

## 2011-04-19 LAB — CBC WITH DIFFERENTIAL/PLATELET
BASO%: 0.8 % (ref 0.0–2.0)
Basophils Absolute: 0.1 10*3/uL (ref 0.0–0.1)
EOS%: 1.1 % (ref 0.0–7.0)
Eosinophils Absolute: 0.1 10*3/uL (ref 0.0–0.5)
HCT: 33.3 % — ABNORMAL LOW (ref 38.4–49.9)
HGB: 11.4 g/dL — ABNORMAL LOW (ref 13.0–17.1)
LYMPH%: 8.4 % — ABNORMAL LOW (ref 14.0–49.0)
MCH: 30.9 pg (ref 27.2–33.4)
MCHC: 34.4 g/dL (ref 32.0–36.0)
MCV: 89.8 fL (ref 79.3–98.0)
MONO#: 0.7 10*3/uL (ref 0.1–0.9)
MONO%: 8.9 % (ref 0.0–14.0)
NEUT#: 6.5 10*3/uL (ref 1.5–6.5)
NEUT%: 80.8 % — ABNORMAL HIGH (ref 39.0–75.0)
Platelets: 330 10*3/uL (ref 140–400)
RBC: 3.71 10*6/uL — ABNORMAL LOW (ref 4.20–5.82)
RDW: 16.5 % — ABNORMAL HIGH (ref 11.0–14.6)
WBC: 8 10*3/uL (ref 4.0–10.3)
lymph#: 0.7 10*3/uL — ABNORMAL LOW (ref 0.9–3.3)

## 2011-04-19 LAB — PROTIME-INR

## 2011-04-19 LAB — PROTHROMBIN TIME
INR: 4.05 — ABNORMAL HIGH (ref <1.50)
Prothrombin Time: 40 seconds — ABNORMAL HIGH (ref 11.6–15.2)

## 2011-04-21 LAB — COMPREHENSIVE METABOLIC PANEL
ALT: 42 U/L (ref 0–53)
AST: 16 U/L (ref 0–37)
Albumin: 3.3 g/dL — ABNORMAL LOW (ref 3.5–5.2)
Alkaline Phosphatase: 83 U/L (ref 39–117)
BUN: 14 mg/dL (ref 6–23)
CO2: 21 mEq/L (ref 19–32)
Calcium: 8.1 mg/dL — ABNORMAL LOW (ref 8.4–10.5)
Chloride: 106 mEq/L (ref 96–112)
Creatinine, Ser: 0.68 mg/dL (ref 0.50–1.35)
Glucose, Bld: 110 mg/dL — ABNORMAL HIGH (ref 70–99)
Potassium: 3.8 mEq/L (ref 3.5–5.3)
Sodium: 140 mEq/L (ref 135–145)
Total Bilirubin: 0.3 mg/dL (ref 0.3–1.2)
Total Protein: 5.1 g/dL — ABNORMAL LOW (ref 6.0–8.3)

## 2011-04-21 LAB — KAPPA/LAMBDA LIGHT CHAINS
Kappa free light chain: 1.06 mg/dL (ref 0.33–1.94)
Kappa:Lambda Ratio: 1.8 — ABNORMAL HIGH (ref 0.26–1.65)
Lambda Free Lght Chn: 0.59 mg/dL (ref 0.57–2.63)

## 2011-04-21 LAB — BETA 2 MICROGLOBULIN, SERUM: Beta-2 Microglobulin: 1.52 mg/L (ref 1.01–1.73)

## 2011-04-21 LAB — IMMUNOFIXATION ELECTROPHORESIS
IgA: 52 mg/dL — ABNORMAL LOW (ref 68–379)
IgG (Immunoglobin G), Serum: 434 mg/dL — ABNORMAL LOW (ref 650–1600)
IgM, Serum: 21 mg/dL — ABNORMAL LOW (ref 41–251)
Total Protein, Serum Electrophoresis: 5.1 g/dL — ABNORMAL LOW (ref 6.0–8.3)

## 2011-05-01 ENCOUNTER — Other Ambulatory Visit: Payer: Self-pay | Admitting: Physician Assistant

## 2011-05-01 ENCOUNTER — Encounter (HOSPITAL_BASED_OUTPATIENT_CLINIC_OR_DEPARTMENT_OTHER): Payer: Medicare (Managed Care) | Admitting: Oncology

## 2011-05-01 DIAGNOSIS — I82409 Acute embolism and thrombosis of unspecified deep veins of unspecified lower extremity: Secondary | ICD-10-CM

## 2011-05-01 DIAGNOSIS — Z7901 Long term (current) use of anticoagulants: Secondary | ICD-10-CM

## 2011-05-01 DIAGNOSIS — C9000 Multiple myeloma not having achieved remission: Secondary | ICD-10-CM

## 2011-05-01 LAB — CBC WITH DIFFERENTIAL/PLATELET
BASO%: 0.4 % (ref 0.0–2.0)
Basophils Absolute: 0 10*3/uL (ref 0.0–0.1)
EOS%: 0.9 % (ref 0.0–7.0)
Eosinophils Absolute: 0.1 10*3/uL (ref 0.0–0.5)
HCT: 35.8 % — ABNORMAL LOW (ref 38.4–49.9)
HGB: 12.8 g/dL — ABNORMAL LOW (ref 13.0–17.1)
LYMPH%: 8.8 % — ABNORMAL LOW (ref 14.0–49.0)
MCH: 29.1 pg (ref 27.2–33.4)
MCHC: 35.8 g/dL (ref 32.0–36.0)
MCV: 81.4 fL (ref 79.3–98.0)
MONO#: 0.4 10*3/uL (ref 0.1–0.9)
MONO%: 3.8 % (ref 0.0–14.0)
NEUT#: 9.2 10*3/uL — ABNORMAL HIGH (ref 1.5–6.5)
NEUT%: 86.1 % — ABNORMAL HIGH (ref 39.0–75.0)
Platelets: 305 10*3/uL (ref 140–400)
RBC: 4.4 10*6/uL (ref 4.20–5.82)
RDW: 15.6 % — ABNORMAL HIGH (ref 11.0–14.6)
WBC: 10.7 10*3/uL — ABNORMAL HIGH (ref 4.0–10.3)
lymph#: 0.9 10*3/uL (ref 0.9–3.3)
nRBC: 0 % (ref 0–0)

## 2011-05-01 LAB — PROTIME-INR
INR: 4.1 — ABNORMAL HIGH (ref 2.00–3.50)
Protime: 49.2 Seconds — ABNORMAL HIGH (ref 10.6–13.4)

## 2011-05-01 LAB — TECHNOLOGIST REVIEW

## 2011-05-03 ENCOUNTER — Encounter: Payer: Self-pay | Admitting: *Deleted

## 2011-05-04 ENCOUNTER — Ambulatory Visit
Admission: RE | Admit: 2011-05-04 | Discharge: 2011-05-04 | Disposition: A | Discharge status: Home / Self Care | Payer: Medicare (Managed Care) | Source: Ambulatory Visit | Attending: Family Medicine | Admitting: Family Medicine

## 2011-05-04 ENCOUNTER — Other Ambulatory Visit: Payer: Self-pay | Admitting: Family Medicine

## 2011-05-04 DIAGNOSIS — L8990 Pressure ulcer of unspecified site, unspecified stage: Secondary | ICD-10-CM

## 2011-05-05 ENCOUNTER — Emergency Department (HOSPITAL_COMMUNITY)
Admission: EM | Admit: 2011-05-05 | Discharge: 2011-05-05 | Disposition: A | Discharge status: Home / Self Care | Payer: Medicare (Managed Care) | Attending: Emergency Medicine | Admitting: Emergency Medicine

## 2011-05-05 ENCOUNTER — Emergency Department (HOSPITAL_COMMUNITY): Payer: Medicare (Managed Care)

## 2011-05-05 DIAGNOSIS — R55 Syncope and collapse: Secondary | ICD-10-CM | POA: Insufficient documentation

## 2011-05-05 DIAGNOSIS — R32 Unspecified urinary incontinence: Secondary | ICD-10-CM | POA: Insufficient documentation

## 2011-05-05 DIAGNOSIS — N390 Urinary tract infection, site not specified: Secondary | ICD-10-CM | POA: Insufficient documentation

## 2011-05-05 DIAGNOSIS — C9000 Multiple myeloma not having achieved remission: Secondary | ICD-10-CM | POA: Insufficient documentation

## 2011-05-05 DIAGNOSIS — Z79899 Other long term (current) drug therapy: Secondary | ICD-10-CM | POA: Insufficient documentation

## 2011-05-05 DIAGNOSIS — R110 Nausea: Secondary | ICD-10-CM | POA: Insufficient documentation

## 2011-05-05 DIAGNOSIS — R82998 Other abnormal findings in urine: Secondary | ICD-10-CM | POA: Insufficient documentation

## 2011-05-05 DIAGNOSIS — G8220 Paraplegia, unspecified: Secondary | ICD-10-CM | POA: Insufficient documentation

## 2011-05-05 DIAGNOSIS — R159 Full incontinence of feces: Secondary | ICD-10-CM | POA: Insufficient documentation

## 2011-05-05 LAB — URINALYSIS, ROUTINE W REFLEX MICROSCOPIC
Bilirubin Urine: NEGATIVE
Glucose, UA: NEGATIVE mg/dL
Ketones, ur: NEGATIVE mg/dL
Nitrite: NEGATIVE
Protein, ur: NEGATIVE mg/dL
Specific Gravity, Urine: 1.044 — ABNORMAL HIGH (ref 1.005–1.030)
Urobilinogen, UA: 0.2 mg/dL (ref 0.0–1.0)
pH: 6.5 (ref 5.0–8.0)

## 2011-05-05 LAB — BASIC METABOLIC PANEL
BUN: 15 mg/dL (ref 6–23)
CO2: 28 mEq/L (ref 19–32)
Calcium: 8.4 mg/dL (ref 8.4–10.5)
Chloride: 103 mEq/L (ref 96–112)
Creatinine, Ser: 0.68 mg/dL (ref 0.50–1.35)
GFR calc Af Amer: >90 mL/min (ref >90)
GFR calc non Af Amer: >90 mL/min (ref >90)
Glucose, Bld: 104 mg/dL — ABNORMAL HIGH (ref 70–99)
Potassium: 3.2 mEq/L — ABNORMAL LOW (ref 3.5–5.1)
Sodium: 140 mEq/L (ref 135–145)

## 2011-05-05 LAB — URINE MICROSCOPIC-ADD ON

## 2011-05-05 LAB — CBC
HCT: 35.5 % — ABNORMAL LOW (ref 39.0–52.0)
Hemoglobin: 12.5 g/dL — ABNORMAL LOW (ref 13.0–17.0)
MCH: 29.6 pg (ref 26.0–34.0)
MCHC: 35.2 g/dL (ref 30.0–36.0)
MCV: 83.9 fL (ref 78.0–100.0)
Platelets: 334 10*3/uL (ref 150–400)
RBC: 4.23 MIL/uL (ref 4.22–5.81)
RDW: 15.8 % — ABNORMAL HIGH (ref 11.5–15.5)
WBC: 16.4 10*3/uL — ABNORMAL HIGH (ref 4.0–10.5)

## 2011-05-05 LAB — GLUCOSE, CAPILLARY: Glucose-Capillary: 88 mg/dL (ref 70–99)

## 2011-05-05 LAB — PROTIME-INR
INR: 2.26 — ABNORMAL HIGH (ref 0.00–1.49)
Prothrombin Time: 25.3 seconds — ABNORMAL HIGH (ref 11.6–15.2)

## 2011-05-05 LAB — LACTIC ACID, PLASMA: Lactic Acid, Venous: 2.4 mmol/L — ABNORMAL HIGH (ref 0.5–2.2)

## 2011-05-05 MED ORDER — IOHEXOL 300 MG/ML  SOLN
100.0000 mL | Freq: Once | INTRAMUSCULAR | Status: AC | PRN
  Administered 2011-05-05: 100 mL via INTRAVENOUS

## 2011-05-08 ENCOUNTER — Encounter (HOSPITAL_BASED_OUTPATIENT_CLINIC_OR_DEPARTMENT_OTHER): Payer: Medicare (Managed Care) | Attending: Internal Medicine

## 2011-05-08 DIAGNOSIS — G8220 Paraplegia, unspecified: Secondary | ICD-10-CM | POA: Insufficient documentation

## 2011-05-08 DIAGNOSIS — L97309 Non-pressure chronic ulcer of unspecified ankle with unspecified severity: Secondary | ICD-10-CM | POA: Insufficient documentation

## 2011-05-08 DIAGNOSIS — N319 Neuromuscular dysfunction of bladder, unspecified: Secondary | ICD-10-CM | POA: Insufficient documentation

## 2011-05-08 DIAGNOSIS — C9000 Multiple myeloma not having achieved remission: Secondary | ICD-10-CM | POA: Insufficient documentation

## 2011-05-08 DIAGNOSIS — Z79899 Other long term (current) drug therapy: Secondary | ICD-10-CM | POA: Insufficient documentation

## 2011-05-08 LAB — URINE CULTURE
Colony Count: >=100000
Culture  Setup Time: 201210122323

## 2011-05-09 NOTE — Progress Notes (Unsigned)
[facility]  NAME:  [name], [name]               ACCOUNT NO.:  [id number]  MEDICAL RECORD NO.:  [id number]      DATE OF BIRTH:  1956-04-21  PHYSICIAN:  Jonelle Sports. Sevier, M.D.  VISIT DATE:  05/08/2011                                  OFFICE VISIT   HISTORY:  This 55 year old black male is seen for a small ulcer in the left lateral malleolar area.  The patient really has had remarkably uncomplicated medical history through the years until he developed multiple myeloma with a mass compressing his spinal column first discovered in December 2011. Earlier this year, he underwent decompressive surgery for that but remains a paraplegic and with neurogenic bladder today.  He is of course on treatment for the myeloma with his current therapeutic agent being Revlimid.  Approximately 2 months ago, his wife who gives him excellent care at home noted a tiny pinhole like lesion around the right lateral malleolar area.  She felt reasonably confident, there had been no excessive pressure on this area and did everything possible to assure that pressure was not a component of it.  However, the wound has not healed and is gradually enlarged at this time, and so she is referred for our further evaluation and advice.  She reports that there has been no significant drainage from the wound, there has been no malodor, no spreading erythema, no fever or systemic symptoms that might relate to the wound.  PAST MEDICAL HISTORY:  Operations include the spinal surgery as indicated above and also surgery to his left heel some 20 years ago.  He has had no other hospitalizations except in association with his myeloma. .  ALLERGIES:  He denies medicinal allergies.  REGULAR MEDICATIONS:  Septra DS taken 3 times a week as prophylaxis for his neurogenic bladder with indwelling catheter, MiraLax powder, Protonix 40 mg daily, Coumadin by schedule, Dulcolax 10 mg daily, and the Revlimid 15 mg  daily.  PERSONAL HISTORY:  He is of course presently disabled.  He has a very capable and competent wife who is assisting with his care at home and he is now under the care of [facility] as well.  The patient does not smoke or use alcoholic beverages.  REVIEW OF SYMPTOMS:  The patient has no history of hypertension, renal insufficiency, other malignancy, diabetes, or cardiopulmonary disease. He has no active symptomatology in any of those areas at the moment.  PHYSICAL EXAMINATION:  VITAL SIGNS:  Blood pressure is 119/89, pulse 100 and regular, respirations 20, temperature 98.8. GENERAL:  He is alert, somewhat obese black male appearing his stated age of 29 in no pain at the moment, cooperative with the examiner. SKIN:  Warm and dry with good turgor. HEENT:  Mucous membranes are moist, perhaps slightly pale. CHEST:  Grossly clear to auscultation. HEART:  Regular in rhythm on percussion, is a bit tachycardic at the moment.  No definite murmur or gallop is heard. ABDOMEN:  Nondistended and nontender. EXTREMITIES:  Some degree of dependent edema although this is really minimal.  On the right lower extremity, the lateral malleolar area is a small wound measuring 0.4 x 0.4 x 0.2 cm with no tendon exposure.  He has a good dorsalis pedis pulse on that foot.  The posterior tibial pulse is less adequately palpated.  His ABI is calculated by the nurse as 0.71.  He is of course fully insensate throughout his lower extremities.  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  DISPOSITION: 1. The wound margins are slightly loose and undermined, and these are     debrided away as is the base of the wound.  No deep structures such     as tendon are revealed and the wound appears clean and noninfected. 2. Following this, the  wound was dressed with an application of silver     collagen and an overlying dressing.  He is placed in a heel     pressure relief boot primarily to keep his ankle in neutral and     diminished motion which should facilitate the healing of this     lesion.  He is advised not to change the dressing for a week and will be seen here again at that time in follow up.          ______________________________ Jonelle Sports. Cheryll Cockayne, M.D.     RES/MEDQ  D:  05/08/2011  T:  05/09/2011  Job:  [id number]

## 2011-05-11 LAB — CULTURE, BLOOD (ROUTINE X 2)
Culture  Setup Time: 201210122001
Culture  Setup Time: 201210122001
Culture: NO GROWTH
Culture: NO GROWTH

## 2011-05-29 ENCOUNTER — Encounter: Payer: Self-pay | Admitting: *Deleted

## 2011-05-29 ENCOUNTER — Encounter (HOSPITAL_BASED_OUTPATIENT_CLINIC_OR_DEPARTMENT_OTHER): Payer: Medicare (Managed Care) | Attending: Internal Medicine

## 2011-05-29 DIAGNOSIS — L97309 Non-pressure chronic ulcer of unspecified ankle with unspecified severity: Secondary | ICD-10-CM | POA: Insufficient documentation

## 2011-05-29 DIAGNOSIS — Z79899 Other long term (current) drug therapy: Secondary | ICD-10-CM | POA: Insufficient documentation

## 2011-05-29 DIAGNOSIS — G8220 Paraplegia, unspecified: Secondary | ICD-10-CM | POA: Insufficient documentation

## 2011-05-29 DIAGNOSIS — C9000 Multiple myeloma not having achieved remission: Secondary | ICD-10-CM | POA: Insufficient documentation

## 2011-05-29 DIAGNOSIS — N319 Neuromuscular dysfunction of bladder, unspecified: Secondary | ICD-10-CM | POA: Insufficient documentation

## 2011-05-29 NOTE — Progress Notes (Signed)
RECEIVED A FAX FROM [facility] CONCERNING A CONFIRMATION OF PRESCRIPTION SHIPMENT. THIS NOTICE WAS GIVEN TO DR.RUBIN'S NURSE, ANGIE WELCH,RN.

## 2011-06-04 ENCOUNTER — Other Ambulatory Visit: Payer: Self-pay | Admitting: Oncology

## 2011-06-05 ENCOUNTER — Other Ambulatory Visit: Payer: Self-pay

## 2011-06-05 ENCOUNTER — Ambulatory Visit (HOSPITAL_BASED_OUTPATIENT_CLINIC_OR_DEPARTMENT_OTHER): Payer: Medicare (Managed Care) | Admitting: Physician Assistant

## 2011-06-05 ENCOUNTER — Ambulatory Visit (HOSPITAL_BASED_OUTPATIENT_CLINIC_OR_DEPARTMENT_OTHER): Payer: Medicare (Managed Care)

## 2011-06-05 ENCOUNTER — Other Ambulatory Visit (HOSPITAL_BASED_OUTPATIENT_CLINIC_OR_DEPARTMENT_OTHER): Payer: Medicare (Managed Care) | Admitting: Lab

## 2011-06-05 DIAGNOSIS — C9000 Multiple myeloma not having achieved remission: Secondary | ICD-10-CM

## 2011-06-05 DIAGNOSIS — Z7901 Long term (current) use of anticoagulants: Secondary | ICD-10-CM

## 2011-06-05 DIAGNOSIS — Z5111 Encounter for antineoplastic chemotherapy: Secondary | ICD-10-CM

## 2011-06-05 DIAGNOSIS — Z5112 Encounter for antineoplastic immunotherapy: Secondary | ICD-10-CM

## 2011-06-05 DIAGNOSIS — I82409 Acute embolism and thrombosis of unspecified deep veins of unspecified lower extremity: Secondary | ICD-10-CM

## 2011-06-05 LAB — TECHNOLOGIST REVIEW

## 2011-06-05 LAB — CBC WITH DIFFERENTIAL/PLATELET
BASO%: 0.3 % (ref 0.0–2.0)
Basophils Absolute: 0 10*3/uL (ref 0.0–0.1)
EOS%: 1.6 % (ref 0.0–7.0)
Eosinophils Absolute: 0.2 10*3/uL (ref 0.0–0.5)
HCT: 37.3 % — ABNORMAL LOW (ref 38.4–49.9)
HGB: 13.1 g/dL (ref 13.0–17.1)
LYMPH%: 8.4 % — ABNORMAL LOW (ref 14.0–49.0)
MCH: 29 pg (ref 27.2–33.4)
MCHC: 35.1 g/dL (ref 32.0–36.0)
MCV: 82.7 fL (ref 79.3–98.0)
MONO#: 0.9 10*3/uL (ref 0.1–0.9)
MONO%: 6.8 % (ref 0.0–14.0)
NEUT#: 10.7 10*3/uL — ABNORMAL HIGH (ref 1.5–6.5)
NEUT%: 82.9 % — ABNORMAL HIGH (ref 39.0–75.0)
Platelets: 313 10*3/uL (ref 140–400)
RBC: 4.51 10*6/uL (ref 4.20–5.82)
RDW: 16.6 % — ABNORMAL HIGH (ref 11.0–14.6)
WBC: 12.9 10*3/uL — ABNORMAL HIGH (ref 4.0–10.3)
lymph#: 1.1 10*3/uL (ref 0.9–3.3)

## 2011-06-05 LAB — PROTHROMBIN TIME
INR: 2.09 — ABNORMAL HIGH (ref <1.50)
Prothrombin Time: 24.2 seconds — ABNORMAL HIGH (ref 11.6–15.2)

## 2011-06-05 MED ORDER — SODIUM CHLORIDE 0.9 % IJ SOLN
3.0000 mL | Freq: Once | INTRAMUSCULAR | Status: DC | PRN
Start: 2011-06-05 — End: 2011-06-05
  Filled 2011-06-05: qty 10

## 2011-06-05 MED ORDER — SODIUM CHLORIDE 0.9 % IV SOLN
Freq: Once | INTRAVENOUS | Status: AC
  Administered 2011-06-05: 12:00:00 via INTRAVENOUS

## 2011-06-05 MED ORDER — HEPARIN SOD (PORK) LOCK FLUSH 100 UNIT/ML IV SOLN
500.0000 [IU] | Freq: Once | INTRAVENOUS | Status: DC | PRN
  Filled 2011-06-05: qty 5

## 2011-06-05 MED ORDER — ALTEPLASE 2 MG IJ SOLR
2.0000 mg | Freq: Once | INTRAMUSCULAR | Status: DC | PRN
  Filled 2011-06-05: qty 2

## 2011-06-05 MED ORDER — ZOLEDRONIC ACID 4 MG/100ML IV SOLN
4.0000 mg | Freq: Once | INTRAVENOUS | Status: AC
  Administered 2011-06-05: 4 mg via INTRAVENOUS
  Filled 2011-06-05: qty 100

## 2011-06-05 MED ORDER — HEPARIN SOD (PORK) LOCK FLUSH 100 UNIT/ML IV SOLN
250.0000 [IU] | Freq: Once | INTRAVENOUS | Status: DC | PRN
  Filled 2011-06-05: qty 5

## 2011-06-05 MED ORDER — SODIUM CHLORIDE 0.9 % IJ SOLN
10.0000 mL | INTRAMUSCULAR | Status: DC | PRN
  Filled 2011-06-05: qty 10

## 2011-06-05 NOTE — Progress Notes (Signed)
CC:   [name], M.D.  PROBLEMS:  A 55 year old [name] with: 1. IgG multiple myeloma diagnosed after presenting with progressive     back pain December 2011 with subsequent hospitalization including     MRI of the T-spine which revealed abnormality of T8 with a 10%     height loss and tumor extension with encroachment upon the spinal     canal with marked decompression of the cul-de-sac and associated     cord edema.  He underwent surgical decompression by Dr. Venetia Maxon.     Radiation was completed on 08/22/2010. 2. Bilateral lower extremity deep venous thrombosis, despite     prophylactic dosing of Coumadin in a therapeutic range due to his     Revlimid therapy.  He underwent IVC filter placement and is on     Coumadin 5 mg on Mondays and Thursdays, 2.5 mg subsequent days,     being managed through [facility].  PAST THERAPY:  Status post 8 cycles of Velcade given subcutaneously 3 weeks on, 1 week off, along with oral Revlimid 15 mg a day and Decadron pulses on Velcade days alone.  CURRENT THERAPY:  Revlimid 15 mg per day, Decadron pulses, and Zometa.  SUBJECTIVE:  [name] is seen today unaccompanied prior to initiating his next monthly dose of Zometa.  He continues on Revlimid at 15 mg per day.  He continues on Coumadin as outlined above.  He states that he has a wound on the inside aspect of his right ankle that is [facility]; it is "healing."  He also states that he has bilateral cataracts and possibly will be undergoing cataract removal in the future.  He denies any fevers, chills, shortness of breath, chest pain.  No nausea or emesis issues.  No bleeding or bruising symptoms.  REVIEW OF SYSTEMS:  Negative.  ALLERGIES:  No known drug allergies.  CURRENT MEDICATIONS:  As per EMR.  ECOG status is unchanged.  OBJECTIVE PHYSICAL EXAMINATION:  Vital Signs:  Blood pressure is 108/56, pulse 106, respirations 20, temp 98,  weight 230 pounds, as noted on 05/01/2011.  HEENT:  Conjunctivae pink.  Sclerae anicteric.  Oropharynx is benign.  No oral mucositis or candidiasis is noted on exam.  Lungs: Clear to auscultation.  No evidence of wheezing or rhonchi.  Heart: Regular rate and rhythm.  Abdomen:  Normal bowel sounds present. Extremities:  Unchanged.  Note a soft boot is in place on the right lower extremity.  Neurologic Exam:  Nonfocal with patient alert and oriented.  LABORATORY DATA:  Hemoglobin 13.1 g, platelet count 113,000, WBC 12,900 with an ANC of 10,700.  CMET, IFE with quantitative immunoglobulins, beta-2 microglobulin, kappa and lambda light chains, along with PT/INR from today is pending.  IMPRESSIONS: 74. A 55 year old [name] with IgG multiple myeloma, for     his next monthly dose of Zometa, continued Revlimid 15 mg a day,     and Decadron pulsing 20 mg on days 1 through 4 on a q.28-day cycle. 2. History of bilateral lower extremity deep venous thromboses despite     prophylactic dosing of Coumadin, IVC filter placement.  PT/INR is     pending from today.  Case has been reviewed with Dr. Donnie Coffin.  PLAN:  The patient will receive his Zometa today as scheduled.  We will see him back in 1 month as already scheduled.  The patient knows to contact us in the interim if the need  should arise.    ______________________________ Sharyl Nimrod, PA CS/MEDQ  D:  06/05/2011  T:  06/05/2011  Job:  [id number]

## 2011-06-05 NOTE — Progress Notes (Signed)
Progress note dictated-CTS 

## 2011-06-07 LAB — COMPREHENSIVE METABOLIC PANEL
ALT: 58 U/L — ABNORMAL HIGH (ref 0–53)
AST: 28 U/L (ref 0–37)
Albumin: 3.6 g/dL (ref 3.5–5.2)
Alkaline Phosphatase: 83 U/L (ref 39–117)
BUN: 13 mg/dL (ref 6–23)
CO2: 22 mEq/L (ref 19–32)
Calcium: 8.8 mg/dL (ref 8.4–10.5)
Chloride: 105 mEq/L (ref 96–112)
Creatinine, Ser: 0.65 mg/dL (ref 0.50–1.35)
Glucose, Bld: 141 mg/dL — ABNORMAL HIGH (ref 70–99)
Potassium: 3.7 mEq/L (ref 3.5–5.3)
Sodium: 140 mEq/L (ref 135–145)
Total Bilirubin: 0.4 mg/dL (ref 0.3–1.2)
Total Protein: 5.9 g/dL — ABNORMAL LOW (ref 6.0–8.3)

## 2011-06-07 LAB — IMMUNOFIXATION ELECTROPHORESIS
IgA: 71 mg/dL (ref 68–379)
IgG (Immunoglobin G), Serum: 509 mg/dL — ABNORMAL LOW (ref 650–1600)
IgM, Serum: 32 mg/dL — ABNORMAL LOW (ref 41–251)
Total Protein, Serum Electrophoresis: 5.9 g/dL — ABNORMAL LOW (ref 6.0–8.3)

## 2011-06-07 LAB — KAPPA/LAMBDA LIGHT CHAINS
Kappa free light chain: 0.77 mg/dL (ref 0.33–1.94)
Kappa:Lambda Ratio: 3.67 — ABNORMAL HIGH (ref 0.26–1.65)
Lambda Free Lght Chn: 0.21 mg/dL — ABNORMAL LOW (ref 0.57–2.63)

## 2011-06-07 LAB — BETA 2 MICROGLOBULIN, SERUM: Beta-2 Microglobulin: 0.98 mg/L — ABNORMAL LOW (ref 1.01–1.73)

## 2011-06-21 ENCOUNTER — Encounter: Payer: Self-pay | Admitting: *Deleted

## 2011-06-21 NOTE — Progress Notes (Signed)
Refill request received from [facility] for Revlimid 15 mg.  Request to MD for review.

## 2011-06-23 ENCOUNTER — Other Ambulatory Visit: Payer: Self-pay | Admitting: *Deleted

## 2011-06-23 DIAGNOSIS — C9000 Multiple myeloma not having achieved remission: Secondary | ICD-10-CM

## 2011-06-23 MED ORDER — LENALIDOMIDE 15 MG PO CAPS: 15.0000 mg | ORAL_CAPSULE | Freq: Every day | ORAL | Status: DC

## 2011-06-23 NOTE — Telephone Encounter (Signed)
[facility] faxed confirmation of facsimile receipt for revlimid refill request.  [facility] will verify insurance coverage and contact the patient.

## 2011-06-23 NOTE — Telephone Encounter (Signed)
Message left on pt.'s home answering machine to call [facility] [phone] to take patient survey for this refill.

## 2011-06-26 ENCOUNTER — Encounter: Payer: Self-pay | Admitting: *Deleted

## 2011-06-26 NOTE — Progress Notes (Signed)
RECEIVED A FAX FROM [facility] CONCERNING A CONFIRMATION OF PRESCRIPTION SHIPMENT. THIS NOTICE WAS GIVEN TO DR.RUBIN'S NURSE, JAN MYERS,RN.

## 2011-06-27 ENCOUNTER — Encounter (HOSPITAL_BASED_OUTPATIENT_CLINIC_OR_DEPARTMENT_OTHER): Payer: Medicare (Managed Care) | Attending: General Surgery

## 2011-06-27 DIAGNOSIS — L8992 Pressure ulcer of unspecified site, stage 2: Secondary | ICD-10-CM | POA: Insufficient documentation

## 2011-06-27 DIAGNOSIS — L89509 Pressure ulcer of unspecified ankle, unspecified stage: Secondary | ICD-10-CM | POA: Insufficient documentation

## 2011-06-27 DIAGNOSIS — L84 Corns and callosities: Secondary | ICD-10-CM | POA: Insufficient documentation

## 2011-07-03 ENCOUNTER — Ambulatory Visit (HOSPITAL_BASED_OUTPATIENT_CLINIC_OR_DEPARTMENT_OTHER): Payer: Medicare (Managed Care) | Admitting: Physician Assistant

## 2011-07-03 ENCOUNTER — Ambulatory Visit (HOSPITAL_BASED_OUTPATIENT_CLINIC_OR_DEPARTMENT_OTHER): Payer: Medicare (Managed Care)

## 2011-07-03 ENCOUNTER — Other Ambulatory Visit (HOSPITAL_BASED_OUTPATIENT_CLINIC_OR_DEPARTMENT_OTHER): Payer: Medicare (Managed Care)

## 2011-07-03 ENCOUNTER — Other Ambulatory Visit: Payer: Self-pay

## 2011-07-03 ENCOUNTER — Telehealth: Payer: Self-pay | Admitting: Oncology

## 2011-07-03 DIAGNOSIS — C9000 Multiple myeloma not having achieved remission: Secondary | ICD-10-CM

## 2011-07-03 DIAGNOSIS — I82409 Acute embolism and thrombosis of unspecified deep veins of unspecified lower extremity: Secondary | ICD-10-CM

## 2011-07-03 LAB — CBC WITH DIFFERENTIAL/PLATELET
BASO%: 0.8 % (ref 0.0–2.0)
Basophils Absolute: 0.1 10*3/uL (ref 0.0–0.1)
EOS%: 3.3 % (ref 0.0–7.0)
Eosinophils Absolute: 0.3 10*3/uL (ref 0.0–0.5)
HCT: 35.4 % — ABNORMAL LOW (ref 38.4–49.9)
HGB: 12.4 g/dL — ABNORMAL LOW (ref 13.0–17.1)
LYMPH%: 9.1 % — ABNORMAL LOW (ref 14.0–49.0)
MCH: 29.2 pg (ref 27.2–33.4)
MCHC: 35 g/dL (ref 32.0–36.0)
MCV: 83.3 fL (ref 79.3–98.0)
MONO#: 1 10*3/uL — ABNORMAL HIGH (ref 0.1–0.9)
MONO%: 10.4 % (ref 0.0–14.0)
NEUT#: 7 10*3/uL — ABNORMAL HIGH (ref 1.5–6.5)
NEUT%: 76.4 % — ABNORMAL HIGH (ref 39.0–75.0)
Platelets: 288 10*3/uL (ref 140–400)
RBC: 4.25 10*6/uL (ref 4.20–5.82)
RDW: 16.9 % — ABNORMAL HIGH (ref 11.0–14.6)
WBC: 9.2 10*3/uL (ref 4.0–10.3)
lymph#: 0.8 10*3/uL — ABNORMAL LOW (ref 0.9–3.3)
nRBC: 0 % (ref 0–0)

## 2011-07-03 LAB — TECHNOLOGIST REVIEW

## 2011-07-03 MED ORDER — SODIUM CHLORIDE 0.9 % IV SOLN
Freq: Once | INTRAVENOUS | Status: AC
  Administered 2011-07-03: 20 mL/h via INTRAVENOUS

## 2011-07-03 MED ORDER — ZOLEDRONIC ACID 4 MG/100ML IV SOLN
4.0000 mg | Freq: Once | INTRAVENOUS | Status: AC
  Administered 2011-07-03: 4 mg via INTRAVENOUS
  Filled 2011-07-03: qty 100

## 2011-07-03 NOTE — Telephone Encounter (Signed)
gv pt appt for jan2013

## 2011-07-03 NOTE — Progress Notes (Signed)
CC:   [name], M.D.  PROBLEMS: 1. This is a 55 year old [name] with IgG multiple     myeloma diagnosed after presenting with progressive back pain in     December 2011 with subsequent hospitalization including MRI of the     T-spine which revealed abnormality of T8 with a 10% height loss and     tumor extension with encroachment upon the spinal canal with marked     decompression of the cul-de-sac and associated cord edema.  He     underwent surgical decompression by Dr. Venetia Maxon.  Radiation was     completed on 08/22/2010. 2. Bilateral lower extremity DVTs despite prophylactic dosing of     Coumadin in a therapeutic range due to Revlimid therapy.  He     underwent IVC filter placement and is on Coumadin, being monitored     through [facility].  PAST THERAPY:  Status post 8 cycles of Velcade given subcutaneously 3 weeks on, 1 week off, along with oral Revlimid at 15 mg per day and Decadron pulses on Velcade days alone.  CURRENT THERAPY:  Revlimid 15 mg per day, Decadron 20 mg on days 1 through 4 on a q.28-day cycle, and monthly Zometa, for which he is due today.  SUBJECTIVE:  Mr. Granade is seen today unaccompanied prior to his next monthly dose of Zometa.  He continues on Revlimid 15 mg per day.  He continues on Coumadin monitored by [facility].  Since his last office visit, he has undergone a left cataract removal.  He is to follow up with Dr. Janet Berlin at [facility] in the near future. He denies any specific complaints, i.e., no fevers, chills, shortness of breath, chest pain.  No neuropathy symptoms whatsoever.  He denies any bleeding or bruising symptoms.  Of note, the wound on the medial aspect of his right ankle is continuing to be [facility].  REVIEW OF SYSTEMS:  Negative.  ALLERGIES:  No known drug allergies.  CURRENT MEDICATIONS:  Current medications as per EMR.  ECOG status is  unchanged.  OBJECTIVE PHYSICAL EXAMINATION:  Vital Signs:  Blood pressure is 151/101, pulse 97, respirations 20, temp 99.0.  HEENT:  Conjunctivae pink.  Sclerae anicteric.  Oropharynx is benign without oral mucositis or candidiasis noted.  Lungs:  Clear to auscultation.  No wheezing or rhonchi.  Heart:  Regular rate and rhythm.  Abdomen:  Full abdominal exam was not performed.  Neurologic Exam:  Really is unchanged.  LABORATORY DATA:  Hemoglobin 12.4 g, platelet count 288,000, WBC 9200 with an ANC of 7000.  Protein studies are pending.  IMPRESSIONS: 32. A 55 year old [name] with IgG multiple myeloma, for     his next monthly dose of Zometa, he continues on Revlimid 15 mg per     day and Decadron pulsing 20 mg on days 1 through 4 on a q.28-day     cycle. 2. History of bilateral lower extremity deep venous thrombosis, prior     inferior vena cava placement, Coumadin being monitored via [facility]. Case has been reviewed with Dr. Donnie Coffin.  PLAN:  Mr. Gitlin will receive his Zometa today as scheduled.  He will return in 1 month's time for followup.  He knows to contact us in the interim as the need should arise.  He will continue on treatment as outlined above.    ______________________________ Sharyl Nimrod, PA CS/MEDQ  D:  07/03/2011  T:  07/03/2011  Job:  [id number]

## 2011-07-03 NOTE — Progress Notes (Signed)
Progress note dictated-CTS 

## 2011-07-05 LAB — COMPREHENSIVE METABOLIC PANEL
ALT: 60 U/L — ABNORMAL HIGH (ref 0–53)
AST: 28 U/L (ref 0–37)
Albumin: 3.6 g/dL (ref 3.5–5.2)
Alkaline Phosphatase: 80 U/L (ref 39–117)
BUN: 16 mg/dL (ref 6–23)
CO2: 19 mEq/L (ref 19–32)
Calcium: 9.3 mg/dL (ref 8.4–10.5)
Chloride: 107 mEq/L (ref 96–112)
Creatinine, Ser: 0.6 mg/dL (ref 0.50–1.35)
Glucose, Bld: 109 mg/dL — ABNORMAL HIGH (ref 70–99)
Potassium: 4.2 mEq/L (ref 3.5–5.3)
Sodium: 139 mEq/L (ref 135–145)
Total Bilirubin: 0.3 mg/dL (ref 0.3–1.2)
Total Protein: 5.8 g/dL — ABNORMAL LOW (ref 6.0–8.3)

## 2011-07-05 LAB — IMMUNOFIXATION ELECTROPHORESIS
IgA: 73 mg/dL (ref 68–379)
IgG (Immunoglobin G), Serum: 466 mg/dL — ABNORMAL LOW (ref 650–1600)
IgM, Serum: 37 mg/dL — ABNORMAL LOW (ref 41–251)
Total Protein, Serum Electrophoresis: 5.8 g/dL — ABNORMAL LOW (ref 6.0–8.3)

## 2011-07-05 LAB — BETA 2 MICROGLOBULIN, SERUM: Beta-2 Microglobulin: 1.28 mg/L (ref 1.01–1.73)

## 2011-07-17 ENCOUNTER — Telehealth: Payer: Self-pay | Admitting: Oncology

## 2011-07-17 NOTE — Telephone Encounter (Signed)
Assistance w/ Revlimid  (Patient has [facility] and it is breaking their budget) Per Dr Renelda Loma request I spoke with [name] @ [facility] to see if we could work something out. Papers were completed and faxed to [facility] [phone].  [name]'s number is [phone].  It has been approved for 6 months expiring 01/10/11.  The new prescription should go to [facility] Fax is [phone].

## 2011-07-20 ENCOUNTER — Emergency Department (HOSPITAL_COMMUNITY): Payer: Medicare (Managed Care) | Admitting: *Deleted

## 2011-07-20 ENCOUNTER — Encounter: Payer: Self-pay | Admitting: *Deleted

## 2011-07-20 ENCOUNTER — Encounter (HOSPITAL_COMMUNITY): Payer: Self-pay | Admitting: *Deleted

## 2011-07-20 ENCOUNTER — Other Ambulatory Visit: Payer: Self-pay | Admitting: *Deleted

## 2011-07-20 ENCOUNTER — Inpatient Hospital Stay (HOSPITAL_COMMUNITY)
Admission: EM | Admit: 2011-07-20 | Discharge: 2011-07-28 | DRG: 329 | Disposition: A | Discharge status: Home / Self Care | Payer: Medicare (Managed Care) | Attending: Surgery | Admitting: Surgery

## 2011-07-20 ENCOUNTER — Other Ambulatory Visit (INDEPENDENT_AMBULATORY_CARE_PROVIDER_SITE_OTHER): Payer: Self-pay | Admitting: General Surgery

## 2011-07-20 ENCOUNTER — Other Ambulatory Visit: Payer: Self-pay | Admitting: Family Medicine

## 2011-07-20 ENCOUNTER — Ambulatory Visit
Admission: RE | Admit: 2011-07-20 | Discharge: 2011-07-20 | Disposition: A | Discharge status: Home / Self Care | Payer: PRIVATE HEALTH INSURANCE | Source: Ambulatory Visit | Attending: Family Medicine | Admitting: Family Medicine

## 2011-07-20 ENCOUNTER — Encounter (HOSPITAL_COMMUNITY): Admission: EM | Disposition: A | Discharge status: Home / Self Care | Payer: Self-pay | Source: Home / Self Care

## 2011-07-20 DIAGNOSIS — R140 Abdominal distension (gaseous): Secondary | ICD-10-CM

## 2011-07-20 DIAGNOSIS — D649 Anemia, unspecified: Secondary | ICD-10-CM | POA: Diagnosis present

## 2011-07-20 DIAGNOSIS — J95821 Acute postprocedural respiratory failure: Secondary | ICD-10-CM | POA: Diagnosis not present

## 2011-07-20 DIAGNOSIS — I82509 Chronic embolism and thrombosis of unspecified deep veins of unspecified lower extremity: Secondary | ICD-10-CM | POA: Diagnosis present

## 2011-07-20 DIAGNOSIS — G8220 Paraplegia, unspecified: Secondary | ICD-10-CM | POA: Diagnosis present

## 2011-07-20 DIAGNOSIS — C9000 Multiple myeloma not having achieved remission: Secondary | ICD-10-CM | POA: Diagnosis present

## 2011-07-20 DIAGNOSIS — K5720 Diverticulitis of large intestine with perforation and abscess without bleeding: Secondary | ICD-10-CM | POA: Diagnosis present

## 2011-07-20 DIAGNOSIS — K631 Perforation of intestine (nontraumatic): Secondary | ICD-10-CM

## 2011-07-20 DIAGNOSIS — L97309 Non-pressure chronic ulcer of unspecified ankle with unspecified severity: Secondary | ICD-10-CM | POA: Diagnosis present

## 2011-07-20 DIAGNOSIS — N319 Neuromuscular dysfunction of bladder, unspecified: Secondary | ICD-10-CM | POA: Diagnosis present

## 2011-07-20 DIAGNOSIS — E2749 Other adrenocortical insufficiency: Secondary | ICD-10-CM | POA: Diagnosis present

## 2011-07-20 DIAGNOSIS — R000 Tachycardia, unspecified: Secondary | ICD-10-CM | POA: Diagnosis present

## 2011-07-20 DIAGNOSIS — R059 Cough, unspecified: Secondary | ICD-10-CM

## 2011-07-20 DIAGNOSIS — Z86718 Personal history of other venous thrombosis and embolism: Secondary | ICD-10-CM

## 2011-07-20 DIAGNOSIS — T380X5A Adverse effect of glucocorticoids and synthetic analogues, initial encounter: Secondary | ICD-10-CM | POA: Diagnosis present

## 2011-07-20 DIAGNOSIS — K5732 Diverticulitis of large intestine without perforation or abscess without bleeding: Principal | ICD-10-CM | POA: Diagnosis present

## 2011-07-20 DIAGNOSIS — Z7901 Long term (current) use of anticoagulants: Secondary | ICD-10-CM

## 2011-07-20 DIAGNOSIS — K651 Peritoneal abscess: Secondary | ICD-10-CM | POA: Diagnosis present

## 2011-07-20 DIAGNOSIS — E876 Hypokalemia: Secondary | ICD-10-CM | POA: Diagnosis not present

## 2011-07-20 DIAGNOSIS — R05 Cough: Secondary | ICD-10-CM

## 2011-07-20 DIAGNOSIS — IMO0002 Reserved for concepts with insufficient information to code with codable children: Secondary | ICD-10-CM

## 2011-07-20 HISTORY — PX: LAPAROTOMY: SHX154

## 2011-07-20 HISTORY — PX: COLOSTOMY REVISION: SHX5232

## 2011-07-20 HISTORY — PX: COLOSTOMY: SHX63

## 2011-07-20 LAB — COMPREHENSIVE METABOLIC PANEL
ALT: 351 U/L — ABNORMAL HIGH (ref 0–53)
AST: 194 U/L — ABNORMAL HIGH (ref 0–37)
Albumin: 3.1 g/dL — ABNORMAL LOW (ref 3.5–5.2)
Alkaline Phosphatase: 166 U/L — ABNORMAL HIGH (ref 39–117)
BUN: 14 mg/dL (ref 6–23)
CO2: 23 mEq/L (ref 19–32)
Calcium: 9.2 mg/dL (ref 8.4–10.5)
Chloride: 94 mEq/L — ABNORMAL LOW (ref 96–112)
Creatinine, Ser: 1.02 mg/dL (ref 0.50–1.35)
GFR calc Af Amer: >90 mL/min (ref >90)
GFR calc non Af Amer: 80 mL/min — ABNORMAL LOW (ref >90)
Glucose, Bld: 132 mg/dL — ABNORMAL HIGH (ref 70–99)
Potassium: 4.8 mEq/L (ref 3.5–5.1)
Sodium: 131 mEq/L — ABNORMAL LOW (ref 135–145)
Total Bilirubin: 0.6 mg/dL (ref 0.3–1.2)
Total Protein: 7.3 g/dL (ref 6.0–8.3)

## 2011-07-20 LAB — PROTIME-INR
INR: 1.74 — ABNORMAL HIGH (ref 0.00–1.49)
INR: 1.89 — ABNORMAL HIGH (ref 0.00–1.49)
Prothrombin Time: 20.7 seconds — ABNORMAL HIGH (ref 11.6–15.2)
Prothrombin Time: 22 seconds — ABNORMAL HIGH (ref 11.6–15.2)

## 2011-07-20 LAB — PREPARE RBC (CROSSMATCH)

## 2011-07-20 LAB — CBC
HCT: 36.3 % — ABNORMAL LOW (ref 39.0–52.0)
Hemoglobin: 13.2 g/dL (ref 13.0–17.0)
MCH: 30.1 pg (ref 26.0–34.0)
MCHC: 36.4 g/dL — ABNORMAL HIGH (ref 30.0–36.0)
MCV: 82.7 fL (ref 78.0–100.0)
Platelets: 290 10*3/uL (ref 150–400)
RBC: 4.39 MIL/uL (ref 4.22–5.81)
RDW: 16.8 % — ABNORMAL HIGH (ref 11.5–15.5)
WBC: 9 10*3/uL (ref 4.0–10.5)

## 2011-07-20 LAB — LIPASE, BLOOD: Lipase: 37 U/L (ref 11–59)

## 2011-07-20 SURGERY — LAPAROTOMY, EXPLORATORY
Anesthesia: General | Site: Abdomen | Wound class: Dirty or Infected

## 2011-07-20 MED ORDER — 0.9 % SODIUM CHLORIDE (POUR BTL) OPTIME
TOPICAL | Status: DC | PRN
  Administered 2011-07-20: 1000 mL

## 2011-07-20 MED ORDER — SODIUM CHLORIDE 0.9 % IV SOLN
Freq: Once | INTRAVENOUS | Status: AC
  Administered 2011-07-20: 20:00:00 via INTRAVENOUS

## 2011-07-20 MED ORDER — SODIUM CHLORIDE 0.9 % IV SOLN
INTRAVENOUS | Status: DC | PRN
  Administered 2011-07-20 (×2): via INTRAVENOUS

## 2011-07-20 MED ORDER — HYDROCORTISONE SOD SUCCINATE 100 MG IJ SOLR
INTRAMUSCULAR | Status: DC | PRN
  Administered 2011-07-20: 100 mg via INTRAVENOUS

## 2011-07-20 MED ORDER — LENALIDOMIDE 15 MG PO CAPS: 15.0000 mg | ORAL_CAPSULE | Freq: Every day | ORAL | Status: DC

## 2011-07-20 MED ORDER — LACTATED RINGERS IV SOLN
INTRAVENOUS | Status: DC | PRN
  Administered 2011-07-20 (×3): via INTRAVENOUS

## 2011-07-20 MED ORDER — SODIUM CHLORIDE 0.9 % IV SOLN
1.0000 g | INTRAVENOUS | Status: AC
  Administered 2011-07-20: 1 g via INTRAVENOUS
  Filled 2011-07-20: qty 1

## 2011-07-20 MED ORDER — FENTANYL CITRATE 0.05 MG/ML IJ SOLN
INTRAMUSCULAR | Status: DC | PRN
  Administered 2011-07-20: 250 ug via INTRAVENOUS
  Administered 2011-07-20: 75 ug via INTRAVENOUS
  Administered 2011-07-20: 100 ug via INTRAVENOUS
  Administered 2011-07-20: 50 ug via INTRAVENOUS
  Administered 2011-07-20: 75 ug via INTRAVENOUS
  Administered 2011-07-20 (×2): 100 ug via INTRAVENOUS
  Administered 2011-07-21: 150 ug via INTRAVENOUS

## 2011-07-20 MED ORDER — ROCURONIUM BROMIDE 100 MG/10ML IV SOLN
INTRAVENOUS | Status: DC | PRN
  Administered 2011-07-20: 50 mg via INTRAVENOUS

## 2011-07-20 MED ORDER — LACTATED RINGERS IV SOLN: INTRAVENOUS | Status: DC

## 2011-07-20 MED ORDER — SODIUM CHLORIDE 0.9 % IV SOLN
INTRAVENOUS | Status: DC | PRN
  Administered 2011-07-20: 22:00:00 via INTRAVENOUS

## 2011-07-20 MED ORDER — PROPOFOL 10 MG/ML IV EMUL
INTRAVENOUS | Status: DC | PRN
  Administered 2011-07-20: 50 ug/kg/min via INTRAVENOUS

## 2011-07-20 MED ORDER — VECURONIUM BROMIDE 10 MG IV SOLR
INTRAVENOUS | Status: DC | PRN
  Administered 2011-07-20 (×2): 2 mg via INTRAVENOUS
  Administered 2011-07-20 (×2): 3 mg via INTRAVENOUS

## 2011-07-20 MED ORDER — PROPOFOL 10 MG/ML IV BOLUS
INTRAVENOUS | Status: DC | PRN
  Administered 2011-07-20: 150 mg via INTRAVENOUS

## 2011-07-20 SURGICAL SUPPLY — 59 items
APL SKNCLS STERI-STRIP NONHPOA (GAUZE/BANDAGES/DRESSINGS) ×2
BAG COLOSTOMY/ILEOSTOMY 1 3/4 (OSTOMY) ×1 IMPLANT
BENZOIN TINCTURE PRP APPL 2/3 (GAUZE/BANDAGES/DRESSINGS) ×1 IMPLANT
BLADE SURG ROTATE 9660 (MISCELLANEOUS) IMPLANT
CANISTER SUCTION 2500CC (MISCELLANEOUS) ×3 IMPLANT
CHLORAPREP W/TINT 26ML (MISCELLANEOUS) ×3 IMPLANT
CLOTH BEACON ORANGE TIMEOUT ST (SAFETY) ×3 IMPLANT
COVER SURGICAL LIGHT HANDLE (MISCELLANEOUS) ×3 IMPLANT
DRAPE LAPAROSCOPIC ABDOMINAL (DRAPES) ×3 IMPLANT
DRAPE WARM FLUID 44X44 (DRAPE) ×3 IMPLANT
DRESSING TELFA 8X3 (GAUZE/BANDAGES/DRESSINGS) ×1 IMPLANT
ELECT BLADE 6.5 EXT (BLADE) ×1 IMPLANT
ELECT REM PT RETURN 9FT ADLT (ELECTROSURGICAL) ×3
ELECTRODE REM PT RTRN 9FT ADLT (ELECTROSURGICAL) ×2 IMPLANT
GLOVE BIO SURGEON STRL SZ 6.5 (GLOVE) ×2 IMPLANT
GLOVE BIO SURGEON STRL SZ7 (GLOVE) ×3 IMPLANT
GLOVE BIO SURGEON STRL SZ7.5 (GLOVE) ×2 IMPLANT
GLOVE BIOGEL PI IND STRL 6.5 (GLOVE) IMPLANT
GLOVE BIOGEL PI IND STRL 7.0 (GLOVE) IMPLANT
GLOVE BIOGEL PI IND STRL 7.5 (GLOVE) ×2 IMPLANT
GLOVE BIOGEL PI INDICATOR 6.5 (GLOVE) ×1
GLOVE BIOGEL PI INDICATOR 7.0 (GLOVE) ×1
GLOVE BIOGEL PI INDICATOR 7.5 (GLOVE) ×4
GLOVE SURG ORTHO 8.0 STRL STRW (GLOVE) ×1 IMPLANT
GLOVE SURG SS PI 6.5 STRL IVOR (GLOVE) ×1 IMPLANT
GOWN PREVENTION PLUS XLARGE (GOWN DISPOSABLE) ×4 IMPLANT
GOWN STRL NON-REIN LRG LVL3 (GOWN DISPOSABLE) ×10 IMPLANT
GOWN STRL REIN XL XLG (GOWN DISPOSABLE) ×1 IMPLANT
KIT BASIN OR (CUSTOM PROCEDURE TRAY) ×3 IMPLANT
KIT ROOM TURNOVER OR (KITS) ×3 IMPLANT
LIGASURE IMPACT 36 18CM CVD LR (INSTRUMENTS) ×1 IMPLANT
NS IRRIG 1000ML POUR BTL (IV SOLUTION) ×6 IMPLANT
PACK GENERAL/GYN (CUSTOM PROCEDURE TRAY) ×3 IMPLANT
PAD ARMBOARD 7.5X6 YLW CONV (MISCELLANEOUS) ×6 IMPLANT
SPECIMEN JAR X LARGE (MISCELLANEOUS) ×1 IMPLANT
SPONGE GAUZE 4X4 12PLY (GAUZE/BANDAGES/DRESSINGS) ×3 IMPLANT
SPONGE LAP 18X18 X RAY DECT (DISPOSABLE) ×1 IMPLANT
STAPLER CUT CVD 40MM GREEN (STAPLE) ×1 IMPLANT
STAPLER CUT RELOAD GREEN (STAPLE) ×2 IMPLANT
STAPLER VISISTAT 35W (STAPLE) ×3 IMPLANT
SUCTION POOLE TIP (SUCTIONS) ×1 IMPLANT
SUT PDS AB 1 TP1 96 (SUTURE) IMPLANT
SUT PROLENE 2 0 SH DA (SUTURE) ×1 IMPLANT
SUT SILK 2 0 (SUTURE) ×3
SUT SILK 2 0 SH CR/8 (SUTURE) ×3 IMPLANT
SUT SILK 2-0 18XBRD TIE 12 (SUTURE) ×2 IMPLANT
SUT SILK 3 0 (SUTURE) ×3
SUT SILK 3 0 SH CR/8 (SUTURE) ×3 IMPLANT
SUT SILK 3 0 TIES 10X30 (SUTURE) ×1 IMPLANT
SUT SILK 3-0 18XBRD TIE 12 (SUTURE) ×2 IMPLANT
SUT VIC AB 3-0 SH 18 (SUTURE) ×1 IMPLANT
SUT VIC AB 3-0 SH 27 (SUTURE) ×9
SUT VIC AB 3-0 SH 27X BRD (SUTURE) IMPLANT
TAPE CLOTH SURG 4X10 WHT LF (GAUZE/BANDAGES/DRESSINGS) ×1 IMPLANT
TOWEL OR 17X24 6PK STRL BLUE (TOWEL DISPOSABLE) ×3 IMPLANT
TOWEL OR 17X26 10 PK STRL BLUE (TOWEL DISPOSABLE) ×3 IMPLANT
TRAY FOLEY CATH 14FRSI W/METER (CATHETERS) IMPLANT
WATER STERILE IRR 1000ML POUR (IV SOLUTION) ×3 IMPLANT
YANKAUER SUCT BULB TIP NO VENT (SUCTIONS) IMPLANT

## 2011-07-20 NOTE — Telephone Encounter (Signed)
[facility] faxed confirmation of facsimile receipt for revlimid to be provided through [facility].

## 2011-07-20 NOTE — ED Provider Notes (Signed)
I did not see this pt, but placed orders as requested by surgeon, Dr. Biagio Quint.  Lottie Mussel, PA 07/20/11 1947

## 2011-07-20 NOTE — ED Notes (Signed)
Blood Bank has four units of plasma ready.  RN Gabe aware.

## 2011-07-20 NOTE — ED Notes (Signed)
Pt sent to ED for eval of air in abdomen. Pt states he had CT done today because of abdominal bloating. Surgery and pcp are here to see pt.

## 2011-07-20 NOTE — Progress Notes (Signed)
[facility] faxed refill request for Revlimid.  Request to MD for review. 

## 2011-07-20 NOTE — ED Provider Notes (Signed)
Medical screening examination/treatment/procedure(s) were performed by non-physician practitioner and as supervising physician I was immediately available for consultation/collaboration.  Mackensi Mahadeo L Yuritzi Kamp, MD 07/20/11 2044 

## 2011-07-20 NOTE — Consult Note (Signed)
Reason for Consult:abdominal distension and free air Referring Physician: [name] is an 55 y.o. male.  HPI: this patient presents to the [facility] emergency room for evaluation of abdominal distention and cough and free air found on abdominal x-ray today.  He is a patient of Dr. Manon Howell has a complicated history of multiple myeloma and T8 paraplegia, adrenal insufficiency and chronic steroid use history of C. Difficile colitis and bilateral DVTs on anticoagulation. He states that over the last month he has had abdominal distention where his abdomen is "tight" and has had abdominal pain with movement and turning which he describes as "pain". This distention has been present for approximately one month and he denies any recent changes in the last few days other than a "cold" with cough and phlegm. He denies any fevers or chills or nausea or vomiting. He states his bowels are normal and denies any blood in the stool or melena. He denies heartburn but takes Protonix daily for an unknown reason for the patient history. He denies any NSAID use, aspirin, or Motrin, or Goody powders. He states he had a bowel movement this morning which was normal.  Past Medical History  Diagnosis Date  . Multiple myeloma     thoracic T8 with paraplegia s/p resection- on chemo at visit 10/13/10  . Neurogenic bladder   . Neurogenic bowel   . Partial small bowel obstruction during dec 2011 admission  . Multiple myeloma   . Multiple myeloma without mention of remission   . Cancer     Past Surgical History  Procedure Date  . Myeloma thoracic t8 with parpaplegia s/p thoracotomy and thoracic t7-9 cage placement on dec 26th 2011 07/18/10  he denies any abdominal surgery  History reviewed. No pertinent family history.  Social History:  reports that he has never smoked. He does not have any smokeless tobacco history on file. He reports that he does not drink alcohol or use illicit drugs.  Allergies: No  Known Allergies  Medications: I have reviewed the patient's current medications as provided by his primary physician.  Results for orders placed during the hospital encounter of 07/20/11 (from the past 48 hour(s))  CBC     Status: Abnormal   Collection Time   07/20/11  6:02 PM      Component Value Range Comment   WBC 9.0  4.0 - 10.5 (K/uL)    RBC 4.39  4.22 - 5.81 (MIL/uL)    Hemoglobin 13.2  13.0 - 17.0 (g/dL)    HCT 78.2 (*) 95.6 - 52.0 (%)    MCV 82.7  78.0 - 100.0 (fL)    MCH 30.1  26.0 - 34.0 (pg)    MCHC 36.4 (*) 30.0 - 36.0 (g/dL)    RDW 21.3 (*) 08.6 - 15.5 (%)    Platelets 290  150 - 400 (K/uL)   PREPARE FRESH FROZEN PLASMA     Status: Normal (Preliminary result)   Collection Time   07/20/11  6:30 PM      Component Value Range Comment   Unit Number [id number]      Blood Component Type THAWED PLASMA      Unit division 00      Status of Unit ALLOCATED      Transfusion Status OK TO TRANSFUSE      Unit Number [id number]      Blood Component Type THAWED PLASMA      Unit division 00      Status of Unit  ALLOCATED      Transfusion Status OK TO TRANSFUSE       Dg Chest 2 View  07/20/2011  *RADIOLOGY REPORT*  Clinical Data: Cough and congestion with abdominal distension.  CHEST - 2 VIEW  Comparison: CT and radiographs 05/05/2011  Findings: The patient has massive pneumoperitoneum with elevation of both hemidiaphragms.  There is resulting mild bibasilar atelectasis.  No pneumothorax is identified.  The heart size and mediastinal contours are stable.  There has been previous thoracic corpectomy and fusion.  IMPRESSION:  1.  Massive pneumoperitoneum concerning for intra-abdominal perforated viscus.  See abdominal examination same date. 2.  Resulting diaphragm elevation and mild bibasilar atelectasis.  Original Report Authenticated By: Gerrianne Scale, M.D.   Dg Abd 2 Views  07/20/2011  *RADIOLOGY REPORT*  Clinical Data: Cough and congestion with abdominal distension.  ABDOMEN - 2  VIEW  Comparison: Abdominal CT 12/09/2010.  Radiographs 07/25/2010.  Findings: There is a large amount of free intraperitoneal air.  The small and large bowel are outlined by air.  The bowel gas pattern appears nonobstructive.  There is an IVC filter in place.  Lower thoracic compression deformity and postsurgical changes status post T7 corpectomy are noted.  IMPRESSION: Massive pneumoperitoneum concerning for perforated abdominal viscus.  Surgical consultation recommended.  Critical Value/emergent results were called by telephone at the time of interpretation on 07/20/2011  at 1700 hours  to  Dr. Dorothe Pea, who verbally acknowledged these results. The results were also discussed with the patient who was instructed to report to the [facility] emergency department.  Original Report Authenticated By: Gerrianne Scale, M.D.   All other review of systems negative or noncontributory except as stated in the HPI   Blood pressure 129/88, pulse 133, temperature 97.8 F (36.6 C), temperature source Oral, resp. rate 17, SpO2 95.00%. General appearance: alert, cooperative, no distress and morbidly obese Head: Normocephalic, without obvious abnormality, atraumatic Eyes: conjunctivae/corneas clear. PERRL, EOM's intact. Fundi benign. Neck: no adenopathy, no JVD and supple, symmetrical, trachea midline Resp: clear to auscultation bilaterally Chest wall: no tenderness Cardio: tachycardic, regular rhythm GI: soft, no apparent tenderness but no feeling as well, no rigidity, small umbilical hernia Extremities: edema he does have some bilat LE edema, pressure boot on right foot Pulses: 2+ and symmetric Skin: Skin color, texture, turgor normal. Dry scaly skin. Neurologic: Sensory: normal, abnormal sensation from xiphoid down  Assessment/Plan: Abdominal distension and free air  As he does have an impressive amount of free air on abdominal x-rays concerning for perforated viscus. His abdominal exam is very  unremarkable at this point. However, given his T8 paraplegia and significant steroid use is a abdominal exam is likely to be unreliable. He does not show any evidence of peritonitis but he does complain of some abdominal pain with movement. I think that given his steroid dependency and paraplegia, immunosuppression and the free air found on abdominal x-ray to think that the most prudent thing to do would be to evaluate for perforated viscus with exploratory laparotomy. I think it would be unreliable to follow his abdominal exams and even though his white count is normal at this time, further delay in diagnosis likely be life-threatening. He is tachycardic and this abdominal distention and with the free air I think it is reasonable to perform laparotomy for diagnosis and possible treatment. I discussed with him the risks of the procedure including negative laparotomy, infection, bleeding, pain, scarring, need for ostomy or bowel resection and he expressed understanding. DVTs  and anticoagulation His most recent INR was 4 his current INR is pending. We would certainly need to correct this prior to any surgical intervention. We will ask the hospitalist service to admit him management his other comorbidities and prepare him for surgery. Adrenal insufficiency and steroid use He will likely need perioperative steroids Multiple Myeloma  [name] DAVID 07/20/2011, 6:35 PM

## 2011-07-20 NOTE — Preoperative (Signed)
Beta Blockers   Reason not to administer Beta Blockers:Not Applicable 

## 2011-07-20 NOTE — Anesthesia Preprocedure Evaluation (Addendum)
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    GI/Hepatic negative GI ROS, Neg liver ROS,   Endo/Other  Morbid obesity  Renal/GU negative Renal ROS     Musculoskeletal   Abdominal (+) obese,   Peds  Hematology Multiple myeloma- chemo   Anesthesia Other Findings   Reproductive/Obstetrics                          Anesthesia Physical Anesthesia Plan  ASA: III and Emergent  Anesthesia Plan: General   Post-op Pain Management:    Induction: Intravenous, Rapid sequence and Cricoid pressure planned  Airway Management Planned: Oral ETT  Additional Equipment: Arterial line  Intra-op Plan:   Post-operative Plan: Possible Post-op intubation/ventilation  Informed Consent: I have reviewed the patients History and Physical, chart, labs and discussed the procedure including the risks, benefits and alternatives for the proposed anesthesia with the patient or authorized representative who has indicated his/her understanding and acceptance.   Dental advisory given  Plan Discussed with: CRNA and Surgeon  Anesthesia Plan Comments: (Plan routine monitors, A-line, GETA with RSI)        Anesthesia Quick Evaluation

## 2011-07-21 ENCOUNTER — Encounter: Payer: Self-pay | Admitting: *Deleted

## 2011-07-21 ENCOUNTER — Inpatient Hospital Stay (HOSPITAL_COMMUNITY): Payer: Medicare (Managed Care)

## 2011-07-21 ENCOUNTER — Encounter (HOSPITAL_COMMUNITY): Payer: Self-pay | Admitting: General Surgery

## 2011-07-21 DIAGNOSIS — E2749 Other adrenocortical insufficiency: Secondary | ICD-10-CM

## 2011-07-21 DIAGNOSIS — K631 Perforation of intestine (nontraumatic): Secondary | ICD-10-CM

## 2011-07-21 DIAGNOSIS — D684 Acquired coagulation factor deficiency: Secondary | ICD-10-CM

## 2011-07-21 DIAGNOSIS — J9600 Acute respiratory failure, unspecified whether with hypoxia or hypercapnia: Secondary | ICD-10-CM

## 2011-07-21 LAB — POCT I-STAT 7, (LYTES, BLD GAS, ICA,H+H)
Acid-base deficit: 1 mmol/L (ref 0.0–2.0)
Bicarbonate: 25.4 mEq/L — ABNORMAL HIGH (ref 20.0–24.0)
Calcium, Ion: 0.92 mmol/L — ABNORMAL LOW (ref 1.12–1.32)
HCT: 27 % — ABNORMAL LOW (ref 39.0–52.0)
Hemoglobin: 9.2 g/dL — ABNORMAL LOW (ref 13.0–17.0)
O2 Saturation: 100 %
Patient temperature: 36.9
Potassium: 3.6 mEq/L (ref 3.5–5.1)
Sodium: 135 mEq/L (ref 135–145)
TCO2: 27 mmol/L (ref 0–100)
pCO2 arterial: 47 mmHg — ABNORMAL HIGH (ref 35.0–45.0)
pH, Arterial: 7.341 — ABNORMAL LOW (ref 7.350–7.450)
pO2, Arterial: 450 mmHg — ABNORMAL HIGH (ref 80.0–100.0)

## 2011-07-21 LAB — PREPARE FRESH FROZEN PLASMA
Unit division: 0
Unit division: 0
Unit division: 0
Unit division: 0

## 2011-07-21 LAB — POCT I-STAT 3, ART BLOOD GAS (G3+)
Acid-base deficit: 1 mmol/L (ref 0.0–2.0)
Acid-base deficit: 2 mmol/L (ref 0.0–2.0)
Bicarbonate: 24.4 mEq/L — ABNORMAL HIGH (ref 20.0–24.0)
Bicarbonate: 21.9 mEq/L (ref 20.0–24.0)
O2 Saturation: 100 %
O2 Saturation: 99 %
Patient temperature: 99.3
Patient temperature: 99.5
TCO2: 26 mmol/L (ref 0–100)
TCO2: 23 mmol/L (ref 0–100)
pCO2 arterial: 42.4 mmHg (ref 35.0–45.0)
pCO2 arterial: 33.6 mmHg — ABNORMAL LOW (ref 35.0–45.0)
pH, Arterial: 7.37 (ref 7.350–7.450)
pH, Arterial: 7.425 (ref 7.350–7.450)
pO2, Arterial: 208 mmHg — ABNORMAL HIGH (ref 80.0–100.0)
pO2, Arterial: 120 mmHg — ABNORMAL HIGH (ref 80.0–100.0)

## 2011-07-21 LAB — DIFFERENTIAL
Basophils Absolute: 0 10*3/uL (ref 0.0–0.1)
Basophils Relative: 0 % (ref 0–1)
Eosinophils Absolute: 0 10*3/uL (ref 0.0–0.7)
Eosinophils Relative: 0 % (ref 0–5)
Lymphocytes Relative: 4 % — ABNORMAL LOW (ref 12–46)
Lymphs Abs: 0.4 10*3/uL — ABNORMAL LOW (ref 0.7–4.0)
Monocytes Absolute: 0.4 10*3/uL (ref 0.1–1.0)
Monocytes Relative: 4 % (ref 3–12)
Neutro Abs: 8.2 10*3/uL — ABNORMAL HIGH (ref 1.7–7.7)
Neutrophils Relative %: 92 % — ABNORMAL HIGH (ref 43–77)
WBC Morphology: INCREASED

## 2011-07-21 LAB — COMPREHENSIVE METABOLIC PANEL
ALT: 171 U/L — ABNORMAL HIGH (ref 0–53)
AST: 83 U/L — ABNORMAL HIGH (ref 0–37)
Albumin: 2.5 g/dL — ABNORMAL LOW (ref 3.5–5.2)
Alkaline Phosphatase: 99 U/L (ref 39–117)
BUN: 12 mg/dL (ref 6–23)
CO2: 23 mEq/L (ref 19–32)
Calcium: 7.6 mg/dL — ABNORMAL LOW (ref 8.4–10.5)
Chloride: 101 mEq/L (ref 96–112)
Creatinine, Ser: 0.66 mg/dL (ref 0.50–1.35)
GFR calc Af Amer: >90 mL/min (ref >90)
GFR calc non Af Amer: >90 mL/min (ref >90)
Glucose, Bld: 135 mg/dL — ABNORMAL HIGH (ref 70–99)
Potassium: 3.9 mEq/L (ref 3.5–5.1)
Sodium: 134 mEq/L — ABNORMAL LOW (ref 135–145)
Total Bilirubin: 0.8 mg/dL (ref 0.3–1.2)
Total Protein: 5.4 g/dL — ABNORMAL LOW (ref 6.0–8.3)

## 2011-07-21 LAB — GLUCOSE, CAPILLARY
Glucose-Capillary: 104 mg/dL — ABNORMAL HIGH (ref 70–99)
Glucose-Capillary: 105 mg/dL — ABNORMAL HIGH (ref 70–99)
Glucose-Capillary: 124 mg/dL — ABNORMAL HIGH (ref 70–99)
Glucose-Capillary: 131 mg/dL — ABNORMAL HIGH (ref 70–99)
Glucose-Capillary: 135 mg/dL — ABNORMAL HIGH (ref 70–99)
Glucose-Capillary: 138 mg/dL — ABNORMAL HIGH (ref 70–99)

## 2011-07-21 LAB — CBC
HCT: 26.8 % — ABNORMAL LOW (ref 39.0–52.0)
Hemoglobin: 9.5 g/dL — ABNORMAL LOW (ref 13.0–17.0)
MCH: 29.2 pg (ref 26.0–34.0)
MCHC: 35.4 g/dL (ref 30.0–36.0)
MCV: 82.5 fL (ref 78.0–100.0)
Platelets: 201 10*3/uL (ref 150–400)
RBC: 3.25 MIL/uL — ABNORMAL LOW (ref 4.22–5.81)
RDW: 16.7 % — ABNORMAL HIGH (ref 11.5–15.5)
WBC: 9 10*3/uL (ref 4.0–10.5)

## 2011-07-21 LAB — PROTIME-INR
INR: 1.65 — ABNORMAL HIGH (ref 0.00–1.49)
Prothrombin Time: 19.8 seconds — ABNORMAL HIGH (ref 11.6–15.2)

## 2011-07-21 LAB — MRSA PCR SCREENING: MRSA by PCR: NEGATIVE

## 2011-07-21 LAB — HEMOGLOBIN AND HEMATOCRIT, BLOOD
HCT: 27 % — ABNORMAL LOW (ref 39.0–52.0)
HCT: 27.8 % — ABNORMAL LOW (ref 39.0–52.0)
HCT: 26.6 % — ABNORMAL LOW (ref 39.0–52.0)
Hemoglobin: 9.6 g/dL — ABNORMAL LOW (ref 13.0–17.0)
Hemoglobin: 9.8 g/dL — ABNORMAL LOW (ref 13.0–17.0)
Hemoglobin: 9.4 g/dL — ABNORMAL LOW (ref 13.0–17.0)

## 2011-07-21 LAB — LACTIC ACID, PLASMA: Lactic Acid, Venous: 1 mmol/L (ref 0.5–2.2)

## 2011-07-21 LAB — PROCALCITONIN: Procalcitonin: 0.17 ng/mL

## 2011-07-21 LAB — APTT: aPTT: 24 seconds (ref 24–37)

## 2011-07-21 MED ORDER — IPRATROPIUM-ALBUTEROL 18-103 MCG/ACT IN AERO: 6.0000 | INHALATION_SPRAY | RESPIRATORY_TRACT | Status: DC | PRN

## 2011-07-21 MED ORDER — CHLORHEXIDINE GLUCONATE 0.12 % MT SOLN
OROMUCOSAL | Status: AC
  Administered 2011-07-21: 15 mL via OROMUCOSAL
  Filled 2011-07-21: qty 15

## 2011-07-21 MED ORDER — SODIUM CHLORIDE 0.9 % IV SOLN
25.0000 ug/h | INTRAVENOUS | Status: DC
  Administered 2011-07-21: 50 ug/h via INTRAVENOUS
  Filled 2011-07-21: qty 50

## 2011-07-21 MED ORDER — SODIUM CHLORIDE 0.9 % IV SOLN: 1.0000 g | INTRAVENOUS | Status: DC

## 2011-07-21 MED ORDER — POTASSIUM CHLORIDE 10 MEQ/100ML IV SOLN
10.0000 meq | INTRAVENOUS | Status: AC
  Administered 2011-07-21 (×2): 10 meq via INTRAVENOUS
  Filled 2011-07-21: qty 200

## 2011-07-21 MED ORDER — FENTANYL CITRATE 0.05 MG/ML IJ SOLN
25.0000 ug | INTRAMUSCULAR | Status: DC | PRN
  Administered 2011-07-21: 100 ug via INTRAVENOUS
  Administered 2011-07-22 – 2011-07-27 (×14): 25 ug via INTRAVENOUS
  Filled 2011-07-21 (×9): qty 2

## 2011-07-21 MED ORDER — PROPOFOL 10 MG/ML IV EMUL: 5.0000 ug/kg/min | INTRAVENOUS | Status: DC

## 2011-07-21 MED ORDER — CHLORHEXIDINE GLUCONATE 0.12 % MT SOLN
15.0000 mL | Freq: Two times a day (BID) | OROMUCOSAL | Status: DC
  Administered 2011-07-21 – 2011-07-22 (×5): 15 mL via OROMUCOSAL
  Filled 2011-07-21 (×4): qty 15

## 2011-07-21 MED ORDER — BIOTENE DRY MOUTH MT LIQD
1.0000 "application " | Freq: Four times a day (QID) | OROMUCOSAL | Status: DC
  Administered 2011-07-21 – 2011-07-23 (×7): 15 mL via OROMUCOSAL

## 2011-07-21 MED ORDER — PROMETHAZINE HCL 25 MG/ML IJ SOLN: 6.2500 mg | INTRAMUSCULAR | Status: DC | PRN

## 2011-07-21 MED ORDER — HYDROCORTISONE SOD SUCCINATE 100 MG IJ SOLR
50.0000 mg | Freq: Four times a day (QID) | INTRAMUSCULAR | Status: DC
  Administered 2011-07-21 – 2011-07-22 (×7): 50 mg via INTRAVENOUS
  Administered 2011-07-22: via INTRAVENOUS
  Administered 2011-07-23 – 2011-07-28 (×23): 50 mg via INTRAVENOUS
  Filled 2011-07-21 (×34): qty 1

## 2011-07-21 MED ORDER — SODIUM CHLORIDE 0.9 % IV SOLN: 25.0000 ug/h | INTRAVENOUS | Status: DC | PRN

## 2011-07-21 MED ORDER — GUAIFENESIN 100 MG/5ML PO SOLN
5.0000 mL | Freq: Four times a day (QID) | ORAL | Status: DC | PRN
  Administered 2011-07-22 – 2011-07-28 (×5): 100 mg via ORAL
  Filled 2011-07-21 (×6): qty 5

## 2011-07-21 MED ORDER — INSULIN ASPART 100 UNIT/ML ~~LOC~~ SOLN
0.0000 [IU] | SUBCUTANEOUS | Status: DC
  Administered 2011-07-21 – 2011-07-22 (×5): 2 [IU] via SUBCUTANEOUS
  Filled 2011-07-21: qty 3

## 2011-07-21 MED ORDER — SODIUM CHLORIDE 0.9 % IV SOLN
1.0000 g | Freq: Every day | INTRAVENOUS | Status: DC
  Administered 2011-07-21 – 2011-07-27 (×8): 1 g via INTRAVENOUS
  Filled 2011-07-21 (×11): qty 1

## 2011-07-21 MED ORDER — SODIUM CHLORIDE 0.9 % IV SOLN: INTRAVENOUS | Status: DC

## 2011-07-21 MED ORDER — PANTOPRAZOLE SODIUM 40 MG IV SOLR
40.0000 mg | Freq: Every day | INTRAVENOUS | Status: DC
  Administered 2011-07-21 – 2011-07-23 (×4): 40 mg via INTRAVENOUS
  Filled 2011-07-21 (×6): qty 40

## 2011-07-21 MED ORDER — SODIUM CHLORIDE 0.9 % IV SOLN
100.0000 mg | Freq: Every day | INTRAVENOUS | Status: DC
  Administered 2011-07-21 – 2011-07-27 (×7): 100 mg via INTRAVENOUS
  Filled 2011-07-21 (×18): qty 100

## 2011-07-21 MED ORDER — HYDROMORPHONE HCL PF 1 MG/ML IJ SOLN: 0.2500 mg | INTRAMUSCULAR | Status: DC | PRN

## 2011-07-21 MED ORDER — IPRATROPIUM-ALBUTEROL 18-103 MCG/ACT IN AERO
6.0000 | INHALATION_SPRAY | RESPIRATORY_TRACT | Status: DC
  Administered 2011-07-21 – 2011-07-23 (×11): 6 via RESPIRATORY_TRACT
  Filled 2011-07-21: qty 14.7

## 2011-07-21 MED ORDER — PROPOFOL 10 MG/ML IV EMUL
5.0000 ug/kg/min | INTRAVENOUS | Status: DC
  Administered 2011-07-21: 50 ug/kg/min via INTRAVENOUS
  Filled 2011-07-21: qty 100

## 2011-07-21 MED ORDER — SODIUM CHLORIDE 0.9 % IJ SOLN
10.0000 mL | Freq: Two times a day (BID) | INTRAMUSCULAR | Status: DC
  Administered 2011-07-21 – 2011-07-26 (×7): 10 mL

## 2011-07-21 MED ORDER — SODIUM CHLORIDE 0.9 % IV SOLN
INTRAVENOUS | Status: DC
  Administered 2011-07-21 – 2011-07-28 (×10): via INTRAVENOUS

## 2011-07-21 MED ORDER — SODIUM CHLORIDE 0.9 % IJ SOLN
10.0000 mL | INTRAMUSCULAR | Status: DC | PRN
  Administered 2011-07-27: 10 mL

## 2011-07-21 NOTE — Progress Notes (Signed)
History of Present Illness: 55 yo AAM with DVT on chronic anticoagulation and chronic steroids for Multiple Myeloma presented to [facility] ED complaining of several weeks of abdominal discomfort and abdominal distension. Was found to have free abdominal air and taken to OR for exploratory laparotomy. Intraoperative findings were cecal perforation with localized abscess but no diffuse peritonitis. Colostomy was placed. Brought to ICU intubated. PCCM was asked to assume care.   Lines / Drains:  12/28 ETT  12/28 OGT  12/28 Foley  12/28 R rad A-line   Cultures:  12/28 BC   Antibiotics:  12/28 Ertapenem (intraabdominal sepsis)  12/28 Micafungin (intraabdominal abscess, immunosuppression)   Tests / Events:  12/28 Massive pneumoperitoneum concerning for perforated abdominal viscus. Surgical consultation recommended.   The patient is sedated, intubated and unable to provide history, which was obtained for available medical records.   Best Practice: DVT: SCD's GI: Protonix  Subjective: No events overnight.  Physical Exam: Filed Vitals:   07/21/11 1039  BP: 161/83  Pulse: 105  Temp:   Resp: 22    Intake/Output Summary (Last 24 hours) at 07/21/11 1123 Last data filed at 07/21/11 1000  Gross per 24 hour  Intake 8137.43 ml  Output   1765 ml  Net 6372.43 ml   Vent Mode:  [-] Stand-by FiO2 (%):  [29.7 %-50.6 %] 29.8 % Set Rate:  [15 bmp] 15 bmp Vt Set:  [570 mL] 570 mL PEEP:  [5 cmH20] 5 cmH20 Pressure Support:  [5 cmH20] 5 cmH20 Plateau Pressure:  [21 cmH20-23 cmH20] 21 cmH20  General: Mechanically ventilated, synchronius  Neuro: Sedated, nonfocal  HEENT: ETT, OGT  Neck: Supple, no JVD  Cardiovascular: RRR, no murmurs  Lungs: Bilateral air movenet, no W/R/R  Abdomen: Soft, surgicall dressing C/D/I, no bowel sounds, viable-looking stoma, bloody stool per rectum  Musculoskeletal: No edema  Skin: Small ulcer lateral side R ankle, otherwise intact  Labs: CBC    Component Value  Date/Time   WBC 9.0 07/21/2011 0038   WBC 9.2 07/03/2011 0917   RBC 3.25* 07/21/2011 0038   RBC 4.25 07/03/2011 0917   HGB 9.6* 07/21/2011 0541   HGB 12.4* 07/03/2011 0917   HCT 27.0* 07/21/2011 0541   HCT 35.4* 07/03/2011 0917   PLT 201 07/21/2011 0038   PLT 288 07/03/2011 0917   MCV 82.5 07/21/2011 0038   MCV 83.3 07/03/2011 0917   MCH 29.2 07/21/2011 0038   MCH 29.2 07/03/2011 0917   MCHC 35.4 07/21/2011 0038   MCHC 35.0 07/03/2011 0917   RDW 16.7* 07/21/2011 0038   RDW 16.9* 07/03/2011 0917   LYMPHSABS 0.4* 07/21/2011 0038   LYMPHSABS 0.8* 07/03/2011 0917   MONOABS 0.4 07/21/2011 0038   MONOABS 1.0* 07/03/2011 0917   EOSABS 0.0 07/21/2011 0038   EOSABS 0.3 07/03/2011 0917   BASOSABS 0.0 07/21/2011 0038   BASOSABS 0.1 07/03/2011 0917    BMET    Component Value Date/Time   NA 134* 07/21/2011 0038   K 3.9 07/21/2011 0038   CL 101 07/21/2011 0038   CO2 23 07/21/2011 0038   GLUCOSE 135* 07/21/2011 0038   BUN 12 07/21/2011 0038   CREATININE 0.66 07/21/2011 0038   CALCIUM 7.6* 07/21/2011 0038   GFRNONAA >90 07/21/2011 0038   GFRAA >90 07/21/2011 0038   ABG    Component Value Date/Time   PHART 7.425 07/21/2011 0950   PCO2ART 33.6* 07/21/2011 0950   PO2ART 120.0* 07/21/2011 0950   HCO3 21.9 07/21/2011 0950   TCO2 23 07/21/2011 0950  ACIDBASEDEF 2.0 07/21/2011 0950   O2SAT 99.0 07/21/2011 0950    No results found for this basename: MG in the last 168 hours Lab Results  Component Value Date   CALCIUM 7.6* 07/21/2011    Chest Xray: ET Tube ok, small lung volumes.  Assessment & Plan: Perforated cecum with pneumoperitoneum, localized abscess but no diffuse peritonitis. Status post exploratory laparotomy and colostomy.   Lab  07/21/11 0038  07/20/11 1802   WBC  9.0  9.0   - Blood culture  - Antibiotics as above  - Ostomy care  - Surgery following  - Stress dose steroids (patient is on steroids for compression Fx of T8 related to MM).  Post operative  acute respiratory failure  - Extubate patient. - Titrate O2 for sat of 88-92% - IS per RT protocol. - Flutter valve. - Early PT. - Bronchodilators   Adrenal insufficiency secondary to chronic steroids. Steroid-induced hyperglycemia.   Lab  07/21/11 0038   GLUCAP  138*    --> Hydrocortisone 50 mg Iv q6h  --> Blood glucose checks q4h and SSI PRN   Multiple myeloma with associated immunosuppression  - No active intervention, but has to be treated as immunocompromised host  - Restart Revlimid, Decadron and Bactrim when able to use GI tract and off abx.  Coagulopathy secondary to coumadin (DVT), excessive intraoperative oozing. Chronic anemia.  - Received FFPx4 in OR, NO PRBCs and seems to have addressed issue. - Hct q6h  - May start therapeutic Heparin in 24-48 hours (per Surgery) if Hct is stable  - Restart Coumadin when able to take PO   The patient is critically ill with multiple organ systems failure and requires high complexity decision making for assessment and support, frequent evaluation and titration of therapies, application of advanced monitoring technologies and extensive interpretation of multiple databases. Critical Care Time devoted to patient care services described in this note is 45 minutes.  Alyson Reedy, M.D. [facility] Pulmonary/Critical Care Medicine. Pager: [phone]. After hours pager: [phone].

## 2011-07-21 NOTE — Progress Notes (Signed)
Restraint order discontinued. Pt was never restrained all night per night RN and not restrained this morning. Will monitor. Quantel Mcinturff, Charity fundraiser.

## 2011-07-21 NOTE — Brief Op Note (Signed)
07/20/2011 - 07/21/2011  12:12 AM  PATIENT:  [name]  55 y.o. male  PRE-OPERATIVE DIAGNOSIS:  Free Air  POST-OPERATIVE DIAGNOSIS:  Perferated colon  PROCEDURE:  Procedure(s): EXPLORATORY LAPAROTOMY COLON RESECTION SIGMOID COLOSTOMY  SURGEON:  Surgeon(s): Rulon Abide, DO Velora Heckler, MD  PHYSICIAN ASSISTANT:   ASSISTANTS: Gerkin    ANESTHESIA:   general  EBL:  Total I/O In: 6390 [I.V.:5450; Blood:940] Out: 650 [Urine:350; Blood:300]  BLOOD ADMINISTERED:4 FFP  DRAINS: none   LOCAL MEDICATIONS USED:  NONE  SPECIMEN:  Source of Specimen:  sigmoid colon  DISPOSITION OF SPECIMEN:  PATHOLOGY  COUNTS:  YES  TOURNIQUET:  * No tourniquets in log *  DICTATION: .Other Dictation: Dictation Number [id number]  PLAN OF CARE: Admit to inpatient   PATIENT DISPOSITION:  ICU - intubated and critically ill.   Delay start of Pharmacological VTE agent (>24hrs) due to surgical blood loss or risk of bleeding:  {YES/NO/NOT APPLICABLE:20182

## 2011-07-21 NOTE — Transfer of Care (Signed)
Immediate Anesthesia Transfer of Care Note  Patient: [name]  Procedure(s) Performed:  EXPLORATORY LAPAROTOMY; COLON RESECTION SIGMOID; COLOSTOMY  Patient Location: SICU  Anesthesia Type: General  Level of Consciousness: sedated and responds to stimulation  Airway & Oxygen Therapy: Patient remains intubated per anesthesia plan  Post-op Assessment: Report given to PACU RN and Post -op Vital signs reviewed and stable  Post vital signs: Reviewed and stable  Complications: No apparent anesthesia complications

## 2011-07-21 NOTE — Progress Notes (Signed)
Rec'd letter from [employer] support verifying approval for 6 months of free revlimid therapy. All RX should be faxed to [phone]

## 2011-07-21 NOTE — Progress Notes (Signed)
Called pt's wife, Annice Pih, and updated her on pt's condition and location, as well as, unit visitation times.

## 2011-07-21 NOTE — Op Note (Signed)
[name], PRINDLE               ACCOUNT NO.:  [id number]  MEDICAL RECORD NO.:  [id number]  LOCATION:  2308                         FACILITY:  [facility]  PHYSICIAN:  Lodema Pilot, MD       DATE OF BIRTH:  [birth year]  DATE OF PROCEDURE:  07/20/2011 DATE OF DISCHARGE:                              OPERATIVE REPORT   PROCEDURE:  Exploratory laparotomy with sigmoid colectomy and end- colostomy.  PREOPERATIVE DIAGNOSES:  Free air and abdominal distention.  POSTOPERATIVE DIAGNOSIS:  Colon perforation.  SURGEON:  Lodema Pilot, MD  ASSISTANT:  Velora Heckler, MD  ANESTHESIA:  General endotracheal anesthesia.  FLUIDS:  4450 mL of crystalloid, 4 units of FFP.  ESTIMATED BLOOD LOSS:  300 mL.  URINE OUTPUT:  350 mL.  SPECIMENS:  Sigmoid colon sent to pathology for permanent sectioning.  DRAINS:  None.  FINDINGS:  Well-defined colon perforation in a sigmoid colon. Significant amount of free air without significant intraabdominal contamination.  INDICATION OF PROCEDURE:  [name] is a 55 year old immunocompromised male with a history of multiple myeloma and steroid use, who presented with 40-month history of abdominal distention and free air found on abdominal x-rays today.  He is also paraplegic and has minimal sensation below the xiphoid.  Given his free air and immunocompromised tachycardia and inability to monitor his abdominal exam, felt the best thing would be to perform exploratory laparotomy to evaluate the free air.  OPERATIVE DETAILS:  [name] was seen and evaluated in the emergency room, and risks and benefits of procedure were discussed in lay terms. Informed consent was obtained, and he was taken to the operating room and placed on the table in a supine position.  General endotracheal anesthesia was obtained.  Prophylactic antibiotics were given, and his abdomen was prepped and draped in standard surgical fashion.  Midline incision was made in the skin and dissection  carried down to the abdominal wall fascia using Bovie electrocautery.  The fascia was incised and the peritoneum entered with my finger.  After the entering the peritoneum, got immediate rush of air and the abdomen decompressed significantly.  It was a foul-smelling air consistent with visceral perforation.  I opened up the abdomen through the length of the incision.  There was no significant contamination identified in the abdomen.  The stomach appeared normal.  Duodenum appeared normal.  I ran the small bowel from the ligament of Treitz distally.  He has adhesion from the loop of terminal ileum to the right lower quadrant, and I noticed area of dilated colon adhered to the right pelvic sidewall. Right colon appendix was visualized and noted to be normal.  The right colon, transverse colon, and left colon appeared normal.  The affected bowel appeared to be a loop of sigmoid colon, which was thickened and adhered to the right pelvic sidewall.  It appeared to be sigmoid perforation, which had walled itself and filled itself to the pelvic sidewall.  This colon was mobilized from the sidewall using blunt dissection and Bovie electrocautery, and there was a foul-smelling air coming from an apparent perforation in the sigmoid colon.  I decided to transect the sigmoid colon of a healthy-appearing sigmoid.  Mesenteric window was created and a green contour stapler was used to transect the sigmoid colon proximally.  Then, I was able to mobilize the mesocolon using the LigaSure device staying high on the colon to avoid injury to retroperitoneal structures into the ureters.  This was carried down to the peritoneal reflection, which appeared to be below the area of perforation.  The fatty mesenteric epiploic appendages were created from around the rectum and a green contour stapler was passed around the rectum, and the specimen was transected below the area of perforation. This appeared to be normal  healthy-appearing male, and the specimen was removed from the patient and sent to pathology for permanent section. The rectal stump was oversewn with a 2-0 Prolene suture for hemostasis, and the sutures were left along to identify the colon in case of potential colostomy takedown.  The abdomen was irritated with sterile solution and hemostasis was obtained.  Any bleeding was ligated with suture ligature and stick ties.  He was fairly oozy throughout the case. His INR was elevated preoperatively.  He had been given 2 units of FFP and his INR was still elevated.  An additional 2 units was given during the case for a total 4 units of FFP.  After we irrigated the abdomen and appeared to be hemostatic without any surgical bleeding, he did appear to have kind of some ooziness throughout from the pelvic sidewall and mesentery, but did not appear to have any surgical bleeding.  The proximal sigmoid colon was mobilized along the [employer] of [place] and after it appeared to be apparently mobile.  A site was selected for ostomy creation in the left lower quadrant and a core of tissue was taken to the abdominal wall fascia.  Cruciate incision was made in the fascia to accommodate 2 fingers and sigmoid colon was brought up through the abdominal wall taking take care to not to twist the mesentery.  It was tacked to the anterior fascia using few 3-0 Vicryl sutures and the abdomen was irrigated with sterile saline solution.  The abdominal wall fascia was approximated with #1 looped PDS x2 taking care to avoid injury to underlying bowel contents.  The fascia was well approximated and the skin edges were approximated with several areas which were left opened for Telfa wicks.  The incision was covered and the staple line on the sigmoid colon was removed, and the ostomy was matured with multiple 3-0 Vicryl sutures.  I attempted to block this colostomy.  The 12 o'clock, 3 o'clock, 6 o'clock, and 9 o'clock  sutures were secured and multiple interrupted prior to full-thickness colon was everted to the dermis circumferentially, and mucosa appeared pink and healthy.  I was able to digitalize the ostomy and easily cannulate through the fascia. The ostomy appeared hemostatic, and it appeared to be pink and healthy, and ostomy appliance was applied.  Sterile dressing was applied and all sponge, needle, and instrument counts were correct at the end of the case.  The patient tolerated the procedure well and was left intubated in stable, but critical condition, transferred to the intensive care unit for further management.          ______________________________ Lodema Pilot, MD     BL/MEDQ  D:  07/21/2011  T:  07/21/2011  Job:  [id number]

## 2011-07-21 NOTE — Procedures (Signed)
Extubation Procedure Note  Patient Details:   Name: [name] DOB: 1956-04-12 MRN: [id number]   Airway Documentation:   Pt extubated to 4L [place]. No stridor noted. BBS dim/clear. Pt coughing up mod clear secretions. Pt able to vocalize name.   Evaluation  O2 sats: stable throughout and currently acceptable Complications: No apparent complications Patient did tolerate procedure well. Bilateral Breath Sounds: Rhonchi Suctioning: Airway   [name] 07/21/2011, 10:40 AM

## 2011-07-21 NOTE — Consult Note (Signed)
WOC ostomy consult  Stoma type/location:  Colostomy surgery performed 12/28 to left upper quad Stomal assessment/size: stoma red and viable, pouch not removed at this time. Output  Scant brown liquid stool. Ostomy pouching: 1pc  Education provided: Discussed pouching routines with family member at bedside.  Pt is total care and they have a caregiver assist at home.  Family member plans to return on Monday at 0900 for pouch chg demonstration.  Cammie Mcgee, RN, MSN, [employer]  [phone]

## 2011-07-21 NOTE — Consult Note (Signed)
Name: [name] MRN: [id number] DOB: Jul 19, 1956    LOS: 1  PCCM ADMISSION NOTE  History of Present Illness: 55 yo AAM with DVT on chronic anticoagulation and chronic steroids for Multiple Myeloma presented to [facility] ED complaining of several weeks of abdominal discomfort and abdominal distension.  Was found to have free abdominal air and taken to OR for exploratory laparotomy.  Intraoperative findings were cecal perforation with localized abscess but no diffuse peritonitis.  Colostomy was placed.  Brought to ICU intubated.  PCCM was asked to assume care.  Lines / Drains: 12/28  ETT 12/28  OGT 12/28  Foley 12/28  R rad A-line  Cultures: 12/28  BC  Antibiotics: 12/28  Ertapenem (intraabdominal sepsis) 12/28  Micafungin (intraabdominal abscess, immunosuppression)  Tests / Events: 12/28  Massive pneumoperitoneum concerning for perforated abdominal viscus. Surgical consultation recommended.  The patient is sedated, intubated and unable to provide history, which was obtained for available medical records.    Past Medical History  Diagnosis Date  . Multiple myeloma     thoracic T8 with paraplegia s/p resection- on chemo at visit 10/13/10  . Neurogenic bladder   . Neurogenic bowel   . Partial small bowel obstruction during dec 2011 admission  . Multiple myeloma   . Multiple myeloma without mention of remission   . Cancer    Past Surgical History  Procedure Date  . Myeloma thoracic t8 with parpaplegia s/p thoracotomy and thoracic t7-9 cage placement on dec 26th 2011 07/18/10   Prior to Admission medications   Medication Sig Start Date End Date Taking? Authorizing Provider  acetaminophen (TYLENOL) 325 MG tablet Take 325-650 mg by mouth every 6 (six) hours as needed. For pain.    Yes Historical Provider, MD  dexamethasone (DECADRON) 2 MG tablet Take 2-4 mg by mouth 2 (two) times daily with a meal. Alternates on days 5-28 of each month between 2 MG daily and 4 MG daily.    Yes  Historical Provider, MD  dexamethasone (DECADRON) 4 MG tablet Take 10 mg by mouth daily. On the 1-4 of each month.    Yes Historical Provider, MD  lenalidomide (REVLIMID) 15 MG capsule Take 15 mg by mouth daily.   07/20/11  Yes Pierce Crane, MD  LORazepam (ATIVAN) 0.5 MG tablet Take 0.25-0.5 mg by mouth every 6 (six) hours as needed. For anxiety.    Yes Historical Provider, MD  methocarbamol (ROBAXIN) 500 MG tablet Take 500 mg by mouth every 6 (six) hours as needed. For muscle spasms. 10/03/10  Yes Historical Provider, MD  oxyCODONE (OXY IR/ROXICODONE) 5 MG immediate release tablet Take 5 mg by mouth 2 (two) times daily as needed. For pain.    Yes Historical Provider, MD  pantoprazole (PROTONIX) 40 MG tablet Take 40 mg by mouth daily.  10/03/10  Yes Historical Provider, MD  polyethylene glycol (MIRALAX / GLYCOLAX) packet Take 17 g by mouth daily.     Yes Historical Provider, MD  sulfamethoxazole-trimethoprim (BACTRIM DS) 800-160 MG per tablet Take 1 tablet by mouth daily. Takes on Mondays, Wednesdays, and Fridays.    Yes Historical Provider, MD  warfarin (COUMADIN) 5 MG tablet Take 5 mg by mouth daily. Mondays take 1/2 tablet (2.5 MG). All other days take 1 tablet (5 MG) 10/10/10  Yes Historical Provider, MD   Allergies No Known Allergies  Family History History reviewed. No pertinent family history.  Social History  reports that he has never smoked. He does not have any smokeless tobacco history on  file. He reports that he does not drink alcohol or use illicit drugs.  Review Of Systems  11 points review of systems is negative with an exception of listed in HPI.  Vital Signs: Temp:  [97.8 F (36.6 C)-100.9 F (38.3 C)] 100.9 F (38.3 C) (12/27 1948) Pulse Rate:  [110-133] 110  (12/27 1948) Resp:  [17-27] 27  (12/27 1948) BP: (125-129)/(88-101) 125/101 mmHg (12/27 1948) SpO2:  [95 %] 95 % (12/27 1821)    Physical Examination: General:  Mechanically ventilated, synchronius Neuro:   Sedated, nonfocal   HEENT:  ETT, OGT Neck:  Supple, no JVD   Cardiovascular:  RRR, no murmurs Lungs:  Bilateral air movenet, no W/R/R Abdomen:  Soft, surgicall dressing C/D/I, no bowel sounds, viable-looking stoma, bloody stool per rectum Musculoskeletal:  No edema Skin:  Small ulcer lateral side R ankle, otherwise intact  Ventilator settings:  APRV 15, 8 cc/kg, +5, 50%   Labs and Imaging:  Reviewed.  Please refer to the Assessment and Plan section for relevant results.  Assessment and Plan:  Perforated cecum with pneumoperitoneum, localized abscess but no diffuse peritonitis.  Status post exploratory laparotomy and colostomy.  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  Steroid-induced hyperglycemia.  Lab 07/21/11 0038  GLUCAP 138*   -->  Hydrocortisone 50 mg Iv q6h -->  Blood glucose checks q4h and SSI PRN  Multiple myeloma with associated immunosuppression -->  No active intervention, but has to be treated as immunocompromised host -->  Restart Revlimid, Decadron and Bactrim when able  Coagulopathy secondary to coumadin (DVT), excessive intraoperative oozing.  Chronic anemia.  Lab 07/21/11 0038 07/20/11 2310 07/20/11 1802  HCT 26.8* 27.0* 36.3*    Lab 07/21/11 0038 07/20/11 2152 07/20/11 1802  INR 1.65* 1.89* 1.74*   -->  Received FFPx4 in OR, NO PRBCs -->  Repeat INR now -->  Hct q6h -->  May start therapeutic Heparin in 24-48 hours (per Surgery) if Hct is stable -->  Restart Coumadin when able to take PO  Best  practices / Disposition -->  ICU status under PCCM -->  Surgery following -->  full code -->  SCDs for DVT Px -->  Protonix for GI Px -->  ventilator bundle -->  NPO -->  family is not available  The patient is critically ill with multiple organ systems failure and requires high complexity decision making for assessment and support, frequent evaluation and titration of therapies, application of advanced monitoring technologies and extensive interpretation of multiple databases. Critical Care Time devoted to patient care services described in this note is 45 minutes.  Orlean Bradford, M.D. Pulmonary and Critical Care Medicine [facility] Cell: [phone] Pager: [phone]  07/21/2011, 12:30 AM

## 2011-07-21 NOTE — Anesthesia Postprocedure Evaluation (Signed)
  Anesthesia Post-op Note  Patient: [name]  Procedure(s) Performed:  EXPLORATORY LAPAROTOMY; COLON RESECTION SIGMOID; COLOSTOMY  Patient Location: SICU  Anesthesia Type: General  Level of Consciousness: sedated  Airway and Oxygen Therapy: Patient remains intubated per anesthesia plan and Patient placed on Ventilator (see vital sign flow sheet for setting)  Post-op Pain: none  Post-op Assessment: Post-op Vital signs reviewed, Patient's Cardiovascular Status Stable, Respiratory Function Stable, Patent Airway and Pain level controlled, remains on ventilator   Post-op Vital Signs: Reviewed and stable  Complications: No apparent anesthesia complications

## 2011-07-21 NOTE — Progress Notes (Signed)
1 Day Post-Op  Subjective: Extubated, OG in place but in cannister since he got to the SICU.  Alert cooperative OG fairly uncomfortable.Some Gas and a little liquid in colostomy bag, which is clear.  Objective: Vital signs in last 24 hours: Temp:  [97.8 F (36.6 C)-100.9 F (38.3 C)] 98.8 F (37.1 C) (12/28 1244) Pulse Rate:  [80-133] 105  (12/28 1039) Resp:  [15-27] 22  (12/28 1039) BP: (106-165)/(67-101) 161/83 mmHg (12/28 1039) SpO2:  [95 %-100 %] 100 % (12/28 1205) Arterial Line BP: (112-154)/(61-81) 146/76 mmHg (12/28 1000) FiO2 (%):  [29.7 %-50.6 %] 29.8 % (12/28 1000) Weight:  [104 kg (229 lb 4.5 oz)] 229 lb 4.5 oz (104 kg) (12/28 0017) Last BM Date: 07/21/11  Intake/Output from previous day: 12/27 0701 - 12/28 0700 In: 7649.2 [I.V.:6619.2; Blood:940; NG/GT:30; IV Piggyback:60] Out: 1585 [Urine:1135; Emesis/NG output:150; Blood:300] Intake/Output this shift: Total I/O In: 488.2 [I.V.:458.2; NG/GT:30] Out: 180 [Urine:130; Emesis/NG output:50]  General appearance: alert, cooperative and mild discomfort Resp: clear to auscultation bilaterally GI: abd is distended, closed with wicks, few bowel sounds Stoma looks good.  Clear sanguneous drainage from colostomy so far,(few cc's in bag.) Some course rhonchi bilat. Lab Results:   Basename 07/21/11 0541 07/21/11 0038 07/20/11 1802  WBC -- 9.0 9.0  HGB 9.6* 9.5* --  HCT 27.0* 26.8* --  PLT -- 201 290    BMET  Basename 07/21/11 0038 07/20/11 2310 07/20/11 1802  NA 134* 135 --  K 3.9 3.6 --  CL 101 -- 94*  CO2 23 -- 23  GLUCOSE 135* -- 132*  BUN 12 -- 14  CREATININE 0.66 -- 1.02  CALCIUM 7.6* -- 9.2   PT/INR  Basename 07/21/11 0038 07/20/11 2152  LABPROT 19.8* 22.0*  INR 1.65* 1.89*     Studies/Results: Dg Chest 2 View  07/20/2011  *RADIOLOGY REPORT*  Clinical Data: Cough and congestion with abdominal distension.  CHEST - 2 VIEW  Comparison: CT and radiographs 05/05/2011  Findings: The patient has  massive pneumoperitoneum with elevation of both hemidiaphragms.  There is resulting mild bibasilar atelectasis.  No pneumothorax is identified.  The heart size and mediastinal contours are stable.  There has been previous thoracic corpectomy and fusion.  IMPRESSION:  1.  Massive pneumoperitoneum concerning for intra-abdominal perforated viscus.  See abdominal examination same date. 2.  Resulting diaphragm elevation and mild bibasilar atelectasis.  Original Report Authenticated By: Gerrianne Scale, M.D.   Dg Chest Port 1 View  07/21/2011  *RADIOLOGY REPORT*  Clinical Data: Status post laparotomy.  Post procedure.  PORTABLE CHEST - 1 VIEW  Comparison: 07/20/2011  Findings: There is an ET tube with tip above the carina. Nasogastric tube tip is in the stomach.  Interval decompression of diffuse free intraperitoneal air from the upper abdomen.  The lung volumes remain low.  No airspace consolidation identified.  IMPRESSION:  1.  Interval decompression of free intraperitoneal air from the upper abdomen.  Original Report Authenticated By: Rosealee Albee, M.D.   Dg Abd 2 Views  07/20/2011  *RADIOLOGY REPORT*  Clinical Data: Cough and congestion with abdominal distension.  ABDOMEN - 2 VIEW  Comparison: Abdominal CT 12/09/2010.  Radiographs 07/25/2010.  Findings: There is a large amount of free intraperitoneal air.  The small and large bowel are outlined by air.  The bowel gas pattern appears nonobstructive.  There is an IVC filter in place.  Lower thoracic compression deformity and postsurgical changes status post T7 corpectomy are noted.  IMPRESSION: Massive  pneumoperitoneum concerning for perforated abdominal viscus.  Surgical consultation recommended.  Critical Value/emergent results were called by telephone at the time of interpretation on 07/20/2011  at 1700 hours  to  Dr. Dorothe Pea, who verbally acknowledged these results. The results were also discussed with the patient who was instructed to report to the  [facility] emergency department.  Original Report Authenticated By: Gerrianne Scale, M.D.    Anti-infectives: Anti-infectives     Start     Dose/Rate Route Frequency Ordered Stop   07/21/11 1200   micafungin (MYCAMINE) 100 mg in sodium chloride 0.9 % 100 mL IVPB        100 mg 100 mL/hr over 1 Hours Intravenous Daily 07/21/11 1156     07/21/11 0130   ertapenem (INVANZ) 1 g in sodium chloride 0.9 % 50 mL IVPB        1 g 100 mL/hr over 30 Minutes Intravenous Daily at bedtime 07/21/11 0044     07/21/11 0045   ertapenem (INVANZ) 1 g in sodium chloride 0.9 % 50 mL IVPB  Status:  Discontinued        1 g 100 mL/hr over 30 Minutes Intravenous Every 24 hours 07/21/11 0033 07/21/11 0035   07/20/11 1945   ertapenem (INVANZ) 1 g in sodium chloride 0.9 % 50 mL IVPB        1 g 100 mL/hr over 30 Minutes Intravenous To [facility] 07/20/11 1931 07/20/11 2104         Current Facility-Administered Medications  Medication Dose Route Frequency Provider Last Rate Last Dose  . 0.9 %  sodium chloride infusion   Intravenous Once Tatyana A Kirichenko, PA 100 mL/hr at 07/20/11 1941    . 0.9 %  sodium chloride infusion   Intravenous Continuous Lonia Farber, MD 150 mL/hr at 07/21/11 1000    . albuterol-ipratropium (COMBIVENT) inhaler 6 puff  6 puff Inhalation Q4H Lonia Farber, MD   6 puff at 07/21/11 1202  . albuterol-ipratropium (COMBIVENT) inhaler 6 puff  6 puff Inhalation Q2H PRN Lonia Farber, MD      . antiseptic oral rinse (BIOTENE) solution 15 mL  1 application Mouth Rinse QID Lonia Farber, MD   15 mL at 07/21/11 1200  . chlorhexidine (PERIDEX) 0.12 % solution 15 mL  15 mL Mouth/Throat BID Lonia Farber, MD   15 mL at 07/21/11 0948  . ertapenem (INVANZ) 1 g in sodium chloride 0.9 % 50 mL IVPB  1 g Intravenous To [facility] Tatyana A Kirichenko, PA   1 g at 07/20/11 2004  . ertapenem (INVANZ) 1 g in sodium chloride 0.9 % 50 mL IVPB   1 g Intravenous QHS Lonia Farber, MD   1 g at 07/21/11 0146  . fentaNYL (SUBLIMAZE) 10 mcg/mL in sodium chloride 0.9 % 250 mL infusion  25-400 mcg/hr Intravenous Continuous Lonia Farber, MD 0.2 mL/hr at 07/21/11 1000 2 mcg/hr at 07/21/11 1000  . hydrocortisone sodium succinate (SOLU-CORTEF) injection 50 mg  50 mg Intravenous Q6H Konstantin Zubelevitskiy, MD   50 mg at 07/21/11 1230  . insulin aspart (novoLOG) injection 0-15 Units  0-15 Units Subcutaneous Q4H Lonia Farber, MD   2 Units at 07/21/11 [phone]  . micafungin (MYCAMINE) 100 mg in sodium chloride 0.9 % 100 mL IVPB  100 mg Intravenous Daily Wesam Yacoub      . pantoprazole (PROTONIX) injection 40 mg  40 mg Intravenous QHS Rulon Abide, DO   40 mg at 07/21/11 0204  . potassium chloride  10 mEq in 100 mL IVPB  10 mEq Intravenous Q1 Hr x 2 Wesam Yacoub   10 mEq at 07/21/11 1229  . propofol (DIPRIVAN) 10 MG/ML infusion  5-70 mcg/kg/min Intravenous Continuous Gary Fleet Abbott, PHARMD   1 mcg/kg/min at 07/21/11 0800  . DISCONTD: 0.9 %  sodium chloride infusion   Intravenous Continuous Rulon Abide, DO      . DISCONTD: 0.9 % irrigation (POUR BTL)    PRN Rulon Abide, DO   1,000 mL at 07/20/11 2143  . DISCONTD: ertapenem (INVANZ) 1 g in sodium chloride 0.9 % 50 mL IVPB  1 g Intravenous Q24H Rulon Abide, DO      . DISCONTD: HYDROmorphone (DILAUDID) injection 0.25-0.5 mg  0.25-0.5 mg Intravenous Q5 min PRN E. Jairo Ben, MD      . DISCONTD: lactated ringers infusion   Intravenous Continuous Alanda Amass, CRNA      . DISCONTD: promethazine (PHENERGAN) injection 6.25-12.5 mg  6.25-12.5 mg Intravenous Q15 min PRN E. Jairo Ben, MD      . DISCONTD: propofol (DIPRIVAN) 10 MG/ML infusion  5-70 mcg/kg/min Intravenous Continuous Lonia Farber, MD   50 mcg/kg/min at 07/21/11 0045   Facility-Administered Medications Ordered in Other Encounters  Medication Dose Route Frequency  Provider Last Rate Last Dose  . DISCONTD: 0.9 %  sodium chloride infusion    Continuous PRN Alanda Amass, CRNA      . DISCONTD: 0.9 %  sodium chloride infusion    Continuous PRN Molli Hazard      . DISCONTD: fentaNYL (SUBLIMAZE) injection    PRN Alanda Amass, CRNA   150 mcg at 07/21/11 0014  . DISCONTD: hydrocortisone sodium succinate (SOLU-CORTEF) injection    PRN Alanda Amass, CRNA   100 mg at 07/20/11 2130  . DISCONTD: lactated ringers infusion    Continuous PRN Alanda Amass, CRNA      . DISCONTD: propofol (DIPRIVAN) 10 mg/mL bolus    PRN Alanda Amass, CRNA   150 mg at 07/20/11 2112  . DISCONTD: propofol (DIPRIVAN) 10 MG/ML infusion    Continuous PRN Molli Hazard 31.3 mL/hr at 07/20/11 2325 50 mcg/kg/min at 07/20/11 2325  . DISCONTD: rocuronium (ZEMURON) injection    PRN Alanda Amass, CRNA   50 mg at 07/20/11 2112  . DISCONTD: vecuronium (NORCURON) injection    PRN Alanda Amass, CRNA   2 mg at 07/20/11 2310    Assessment/Plan Perforated colon with exploratory lap, Sigmoid colon resection, and colostomy  Extubated Multiple myeloma T8 paraplegia, neurogenic bowel and bladder. S/P Thoracotomy, thoracic T7-9 [place] 06/2010 Adrenal insuffiency, on chronic steroid use. Hx bilat DVT, on anticoagulation Hx C Diff. Plan: Discuss with  Dr. Lindie Spruce, change OG to NG, ?PICC placement They had to stick in foot for Blood culture earlier and only got 1 of 2 cultures requested.   LOS: 1 day    [name] 07/21/2011

## 2011-07-21 NOTE — Significant Event (Signed)
Fentanyl drip (approximately 200cc left) is wasted in sink and flushed. Verification of fentanyl waste with Janey Greaser. Taiwo Fish, Charity fundraiser

## 2011-07-21 NOTE — Progress Notes (Signed)
PICC is appropriate.  Extubated.  Will DC OGT and keep NPO.  Will order PICC.  [name]. Gae Bon, MD, FACS [phone] [facility]

## 2011-07-22 ENCOUNTER — Inpatient Hospital Stay (HOSPITAL_COMMUNITY): Payer: Medicare (Managed Care)

## 2011-07-22 LAB — COMPREHENSIVE METABOLIC PANEL
ALT: 101 U/L — ABNORMAL HIGH (ref 0–53)
AST: 22 U/L (ref 0–37)
Albumin: 2 g/dL — ABNORMAL LOW (ref 3.5–5.2)
Alkaline Phosphatase: 87 U/L (ref 39–117)
BUN: 7 mg/dL (ref 6–23)
CO2: 22 mEq/L (ref 19–32)
Calcium: 6.4 mg/dL — CL (ref 8.4–10.5)
Chloride: 110 mEq/L (ref 96–112)
Creatinine, Ser: 0.5 mg/dL (ref 0.50–1.35)
GFR calc Af Amer: >90 mL/min (ref >90)
GFR calc non Af Amer: >90 mL/min (ref >90)
Glucose, Bld: 104 mg/dL — ABNORMAL HIGH (ref 70–99)
Potassium: 3.2 mEq/L — ABNORMAL LOW (ref 3.5–5.1)
Sodium: 141 mEq/L (ref 135–145)
Total Bilirubin: 0.4 mg/dL (ref 0.3–1.2)
Total Protein: 4.9 g/dL — ABNORMAL LOW (ref 6.0–8.3)

## 2011-07-22 LAB — CBC
HCT: 22.9 % — ABNORMAL LOW (ref 39.0–52.0)
Hemoglobin: 8.1 g/dL — ABNORMAL LOW (ref 13.0–17.0)
MCH: 29.3 pg (ref 26.0–34.0)
MCHC: 35.4 g/dL (ref 30.0–36.0)
MCV: 83 fL (ref 78.0–100.0)
Platelets: 178 10*3/uL (ref 150–400)
RBC: 2.76 MIL/uL — ABNORMAL LOW (ref 4.22–5.81)
RDW: 16.7 % — ABNORMAL HIGH (ref 11.5–15.5)
WBC: 9.1 10*3/uL (ref 4.0–10.5)

## 2011-07-22 LAB — GLUCOSE, CAPILLARY
Glucose-Capillary: 105 mg/dL — ABNORMAL HIGH (ref 70–99)
Glucose-Capillary: 110 mg/dL — ABNORMAL HIGH (ref 70–99)
Glucose-Capillary: 110 mg/dL — ABNORMAL HIGH (ref 70–99)
Glucose-Capillary: 110 mg/dL — ABNORMAL HIGH (ref 70–99)
Glucose-Capillary: 127 mg/dL — ABNORMAL HIGH (ref 70–99)
Glucose-Capillary: 132 mg/dL — ABNORMAL HIGH (ref 70–99)

## 2011-07-22 LAB — MAGNESIUM: Magnesium: 1.6 mg/dL (ref 1.5–2.5)

## 2011-07-22 LAB — PHOSPHORUS: Phosphorus: 2 mg/dL — ABNORMAL LOW (ref 2.3–4.6)

## 2011-07-22 MED ORDER — MAGNESIUM SULFATE 40 MG/ML IJ SOLN
2.0000 g | Freq: Once | INTRAMUSCULAR | Status: AC
  Administered 2011-07-22: 2 g via INTRAVENOUS
  Filled 2011-07-22 (×2): qty 50

## 2011-07-22 MED ORDER — MAGNESIUM SULFATE BOLUS VIA INFUSION
2.0000 g | Freq: Once | INTRAVENOUS | Status: DC
  Filled 2011-07-22: qty 500

## 2011-07-22 MED ORDER — PHENOL 1.4 % MT LIQD: 1.0000 | OROMUCOSAL | Status: DC | PRN

## 2011-07-22 MED ORDER — POTASSIUM PHOSPHATE DIBASIC 3 MMOLE/ML IV SOLN
30.0000 mmol | Freq: Once | INTRAVENOUS | Status: AC
  Administered 2011-07-22: 30 mmol via INTRAVENOUS
  Filled 2011-07-22 (×2): qty 10

## 2011-07-22 NOTE — Progress Notes (Signed)
Patient ID: [name], male   DOB: 07/15/1956, 55 y.o.   MRN: [id number] 2 Days Post-Op  Subjective: Tolerating extubation. comfortable  Objective: Vital signs in last 24 hours: Temp:  [98.5 F (36.9 C)-100.5 F (38.1 C)] 98.5 F (36.9 C) (12/29 0754) Pulse Rate:  [72-108] 72  (12/29 0800) Resp:  [15-23] 15  (12/29 0800) BP: (118-161)/(68-93) 122/72 mmHg (12/29 0800) SpO2:  [97 %-100 %] 100 % (12/29 0825) Arterial Line BP: (142-171)/(72-93) 149/72 mmHg (12/29 0800) FiO2 (%):  [29.8 %-30 %] 29.8 % (12/28 1000) Last BM Date: 07/21/11  Intake/Output this shift: Total I/O In: 150 [I.V.:150] Out: 60 [Urine:60]  Physical Exam: BP 122/72  Pulse 72  Temp(Src) 98.5 F (36.9 C) (Oral)  Resp 15  Ht 5\' 9"  (1.753 m)  Wt 229 lb 4.5 oz (104 kg)  BMI 33.86 kg/m2  SpO2 100% Abdomen soft.  Ostomy viable.  No gas in bag  Labs: CBC  Basename 07/22/11 0400 07/21/11 1944 07/21/11 0038  WBC 9.1 -- 9.0  HGB 8.1* 9.4* --  HCT 22.9* 26.6* --  PLT 178 -- 201   BMET  Basename 07/22/11 0400 07/21/11 0038  NA 141 134*  K 3.2* 3.9  CL 110 101  CO2 22 23  GLUCOSE 104* 135*  BUN 7 12  CREATININE 0.50 0.66  CALCIUM 6.4* 7.6*   LFT  Basename 07/22/11 0400 07/20/11 1802  PROT 4.9* --  ALBUMIN 2.0* --  AST 22 --  ALT 101* --  ALKPHOS 87 --  BILITOT 0.4 --  BILIDIR -- --  IBILI -- --  LIPASE -- 37   PT/INR  Basename 07/21/11 0038 07/20/11 2152  LABPROT 19.8* 22.0*  INR 1.65* 1.89*   ABG  Basename 07/21/11 0950 07/21/11 0038  PHART 7.425 7.370  HCO3 21.9 24.4*    Studies/Results: Dg Chest 2 View  07/20/2011  *RADIOLOGY REPORT*  Clinical Data: Cough and congestion with abdominal distension.  CHEST - 2 VIEW  Comparison: CT and radiographs 05/05/2011  Findings: The patient has massive pneumoperitoneum with elevation of both hemidiaphragms.  There is resulting mild bibasilar atelectasis.  No pneumothorax is identified.  The heart size and mediastinal contours are  stable.  There has been previous thoracic corpectomy and fusion.  IMPRESSION:  1.  Massive pneumoperitoneum concerning for intra-abdominal perforated viscus.  See abdominal examination same date. 2.  Resulting diaphragm elevation and mild bibasilar atelectasis.  Original Report Authenticated By: Gerrianne Scale, M.D.   Dg Chest Port 1 View  07/21/2011  *RADIOLOGY REPORT*  Clinical Data: Status post laparotomy.  Post procedure.  PORTABLE CHEST - 1 VIEW  Comparison: 07/20/2011  Findings: There is an ET tube with tip above the carina. Nasogastric tube tip is in the stomach.  Interval decompression of diffuse free intraperitoneal air from the upper abdomen.  The lung volumes remain low.  No airspace consolidation identified.  IMPRESSION:  1.  Interval decompression of free intraperitoneal air from the upper abdomen.  Original Report Authenticated By: Rosealee Albee, M.D.   Dg Abd 2 Views  07/20/2011  *RADIOLOGY REPORT*  Clinical Data: Cough and congestion with abdominal distension.  ABDOMEN - 2 VIEW  Comparison: Abdominal CT 12/09/2010.  Radiographs 07/25/2010.  Findings: There is Howell large amount of free intraperitoneal air.  The small and large bowel are outlined by air.  The bowel gas pattern appears nonobstructive.  There is an IVC filter in place.  Lower thoracic compression deformity and postsurgical changes status post T7 corpectomy  are noted.  IMPRESSION: Massive pneumoperitoneum concerning for perforated abdominal viscus.  Surgical consultation recommended.  Critical Value/emergent results were called by telephone at the time of interpretation on 07/20/2011  at 1700 hours  to  Dr. Dorothe Pea, who verbally acknowledged these results. The results were also discussed with the patient who was instructed to report to the [facility] emergency department.  Original Report Authenticated By: Gerrianne Scale, M.D.    Assessment: Active Problems:  * No active hospital problems. *     Procedure(s): EXPLORATORY LAPAROTOMY COLON RESECTION SIGMOID COLOSTOMY  Plan: Continuing current care, wound care, antibiotics, etc. Out of bed Repeat labs in am  LOS: 2 days    [name],[name] 07/22/2011

## 2011-07-22 NOTE — Progress Notes (Signed)
History of Present Illness: 55 yo AAM with DVT on chronic anticoagulation and chronic steroids for Multiple Myeloma presented to [facility] ED complaining of several weeks of abdominal discomfort and abdominal distension. Was found to have free abdominal air and taken to OR for exploratory laparotomy. Intraoperative findings were cecal perforation with localized abscess but no diffuse peritonitis. Colostomy was placed. Brought to ICU intubated. PCCM was asked to assume care.   Lines / Drains:  12/28 ETT >>>12/28 12/28 OGT >>>12/28 12/28 Foley  12/28 R rad A-line >>>12/29  Cultures:  12/28 BC   Antibiotics:  12/28 Ertapenem (intraabdominal sepsis)  12/28 Micafungin (intraabdominal abscess, immunosuppression)   Tests / Events:  12/28 Massive pneumoperitoneum concerning for perforated abdominal viscus. Surgical consultation recommended.   Best Practice: DVT: SCD's GI: Protonix  Subjective: No events overnight, extubated and doing well from a respiratory standpoint.  Physical Exam: Filed Vitals:   07/22/11 0800  BP: 122/72  Pulse: 72  Temp:   Resp: 15    Intake/Output Summary (Last 24 hours) at 07/22/11 1022 Last data filed at 07/22/11 0800  Gross per 24 hour  Intake   3208 ml  Output   1125 ml  Net   2083 ml   Vent Mode:  [-] Stand-by  General: Mechanically ventilated, synchronius  Neuro: Alert and oriented, paraplegia. HEENT: [place]/AT, PERRL, EOM-I and moist mucous membrane. Neck: Supple, no JVD  Cardiovascular: RRR, no murmurs  Lungs: Bilateral air movenet, no W/R/R  Abdomen: Soft, surgicall dressing C/D/I, no bowel sounds, viable-looking stoma, bloody stool per rectum  Musculoskeletal: No edema  Skin: Small ulcer lateral side R ankle, otherwise intact  Labs: CBC    Component Value Date/Time   WBC 9.1 07/22/2011 0400   WBC 9.2 07/03/2011 0917   RBC 2.76* 07/22/2011 0400   RBC 4.25 07/03/2011 0917   HGB 8.1* 07/22/2011 0400   HGB 12.4* 07/03/2011 0917   HCT 22.9*  07/22/2011 0400   HCT 35.4* 07/03/2011 0917   PLT 178 07/22/2011 0400   PLT 288 07/03/2011 0917   MCV 83.0 07/22/2011 0400   MCV 83.3 07/03/2011 0917   MCH 29.3 07/22/2011 0400   MCH 29.2 07/03/2011 0917   MCHC 35.4 07/22/2011 0400   MCHC 35.0 07/03/2011 0917   RDW 16.7* 07/22/2011 0400   RDW 16.9* 07/03/2011 0917   LYMPHSABS 0.4* 07/21/2011 0038   LYMPHSABS 0.8* 07/03/2011 0917   MONOABS 0.4 07/21/2011 0038   MONOABS 1.0* 07/03/2011 0917   EOSABS 0.0 07/21/2011 0038   EOSABS 0.3 07/03/2011 0917   BASOSABS 0.0 07/21/2011 0038   BASOSABS 0.1 07/03/2011 0917    BMET    Component Value Date/Time   NA 141 07/22/2011 0400   K 3.2* 07/22/2011 0400   CL 110 07/22/2011 0400   CO2 22 07/22/2011 0400   GLUCOSE 104* 07/22/2011 0400   BUN 7 07/22/2011 0400   CREATININE 0.50 07/22/2011 0400   CALCIUM 6.4* 07/22/2011 0400   GFRNONAA >90 07/22/2011 0400   GFRAA >90 07/22/2011 0400   ABG    Component Value Date/Time   PHART 7.425 07/21/2011 0950   PCO2ART 33.6* 07/21/2011 0950   PO2ART 120.0* 07/21/2011 0950   HCO3 21.9 07/21/2011 0950   TCO2 23 07/21/2011 0950   ACIDBASEDEF 2.0 07/21/2011 0950   O2SAT 99.0 07/21/2011 0950     Lab 07/22/11 0400  MG 1.6   Lab Results  Component Value Date   CALCIUM 6.4* 07/22/2011   PHOS 2.0* 07/22/2011    Chest Xray:  ET Tube ok, small lung volumes.  Assessment & Plan: Perforated cecum with pneumoperitoneum, localized abscess but no diffuse peritonitis. Status post exploratory laparotomy and colostomy.  - Blood culture not yet final - Antibiotics as above  - Ostomy care per surgery - Stress dose steroids (patient is on steroids for compression Fx of T8 related to MM) until able to go back on regular dose of decadron (when GI tract is useable). - Decrease IVF to 50 ml/hr to avoid fluid overload, once on full liquid or regular diet then would [facility] IVF all together.  Post operative acute respiratory failure  - Titrate O2 for sat of  88-92% - IS per RT protocol. - Early PT. - Bronchodilators   Adrenal insufficiency secondary to chronic steroids. Steroid-induced hyperglycemia.  - Hydrocortisone 50 mg Iv q6h, may change to PO when able (defer to surgery) decadron 2-4 mg PO BID for compression fracture of T8 as above. - Blood glucose checks q4h and SSI PRN   Multiple myeloma with associated immunosuppression  - No active intervention, but has to be treated as immunocompromised host  - Restart Revlimid, Decadron and Bactrim when able to use GI tract and off abx.  Coagulopathy secondary to coumadin (DVT), excessive intraoperative oozing. Chronic anemia.  - Received FFPx4 in OR, NO PRBCs and seems to have addressed issue. - May start therapeutic Heparin in 24-48 hours (per Surgery) if Hct is stable  - Restart Coumadin when able to take PO   PCCM will sign off at this point, please call back if needed.  Alyson Reedy, M.D. [facility] Pulmonary/Critical Care Medicine. Pager: [phone]. After hours pager: [phone].

## 2011-07-22 NOTE — Progress Notes (Signed)
CRITICAL VALUE ALERT  Critical value received:  Ca 6.4  Date of notification:  07/22/2011   Time of notification:  0535  Critical value read back:yes  Nurse who received alert:  Adrienne Mocha, Adam Phenix  MD notified (1st page):  yes  Time of first page:  0540  MD notified (2nd page):  Time of second page:  Responding MD:  Michele Rockers  Time MD responded:  [phone]

## 2011-07-23 LAB — CBC
HCT: 25.2 % — ABNORMAL LOW (ref 39.0–52.0)
Hemoglobin: 8.8 g/dL — ABNORMAL LOW (ref 13.0–17.0)
MCH: 28.9 pg (ref 26.0–34.0)
MCHC: 34.9 g/dL (ref 30.0–36.0)
MCV: 82.9 fL (ref 78.0–100.0)
Platelets: 187 10*3/uL (ref 150–400)
RBC: 3.04 MIL/uL — ABNORMAL LOW (ref 4.22–5.81)
RDW: 16.7 % — ABNORMAL HIGH (ref 11.5–15.5)
WBC: 8.9 10*3/uL (ref 4.0–10.5)

## 2011-07-23 LAB — BASIC METABOLIC PANEL
BUN: 5 mg/dL — ABNORMAL LOW (ref 6–23)
CO2: 26 mEq/L (ref 19–32)
Calcium: 7.2 mg/dL — ABNORMAL LOW (ref 8.4–10.5)
Chloride: 108 mEq/L (ref 96–112)
Creatinine, Ser: 0.49 mg/dL — ABNORMAL LOW (ref 0.50–1.35)
GFR calc Af Amer: >90 mL/min (ref >90)
GFR calc non Af Amer: >90 mL/min (ref >90)
Glucose, Bld: 95 mg/dL (ref 70–99)
Potassium: 3 mEq/L — ABNORMAL LOW (ref 3.5–5.1)
Sodium: 141 mEq/L (ref 135–145)

## 2011-07-23 LAB — MAGNESIUM: Magnesium: 2.5 mg/dL (ref 1.5–2.5)

## 2011-07-23 LAB — GLUCOSE, CAPILLARY
Glucose-Capillary: 100 mg/dL — ABNORMAL HIGH (ref 70–99)
Glucose-Capillary: 92 mg/dL (ref 70–99)
Glucose-Capillary: 92 mg/dL (ref 70–99)
Glucose-Capillary: 100 mg/dL — ABNORMAL HIGH (ref 70–99)
Glucose-Capillary: 91 mg/dL (ref 70–99)
Glucose-Capillary: 82 mg/dL (ref 70–99)

## 2011-07-23 LAB — PHOSPHORUS: Phosphorus: 1.8 mg/dL — ABNORMAL LOW (ref 2.3–4.6)

## 2011-07-23 MED ORDER — IPRATROPIUM-ALBUTEROL 18-103 MCG/ACT IN AERO
2.0000 | INHALATION_SPRAY | RESPIRATORY_TRACT | Status: DC
  Administered 2011-07-23 – 2011-07-25 (×8): 2 via RESPIRATORY_TRACT
  Filled 2011-07-23: qty 14.7

## 2011-07-23 MED ORDER — POTASSIUM CHLORIDE 10 MEQ/100ML IV SOLN
10.0000 meq | INTRAVENOUS | Status: AC
  Administered 2011-07-23 (×4): 10 meq via INTRAVENOUS
  Filled 2011-07-23: qty 400

## 2011-07-23 MED ORDER — ONDANSETRON HCL 4 MG/2ML IJ SOLN
4.0000 mg | Freq: Four times a day (QID) | INTRAMUSCULAR | Status: DC | PRN
  Administered 2011-07-23: 4 mg via INTRAVENOUS
  Filled 2011-07-23: qty 2

## 2011-07-23 MED ORDER — HEPARIN SODIUM (PORCINE) 5000 UNIT/ML IJ SOLN
5000.0000 [IU] | Freq: Three times a day (TID) | INTRAMUSCULAR | Status: DC
  Administered 2011-07-23 – 2011-07-28 (×17): 5000 [IU] via SUBCUTANEOUS
  Filled 2011-07-23 (×20): qty 1

## 2011-07-23 MED ORDER — SODIUM PHOSPHATE 3 MMOLE/ML IV SOLN
30.0000 mmol | Freq: Once | INTRAVENOUS | Status: AC
  Administered 2011-07-23: 30 mmol via INTRAVENOUS
  Filled 2011-07-23: qty 10

## 2011-07-23 MED ORDER — IPRATROPIUM-ALBUTEROL 18-103 MCG/ACT IN AERO: 2.0000 | INHALATION_SPRAY | RESPIRATORY_TRACT | Status: DC | PRN

## 2011-07-23 MED ORDER — METHOCARBAMOL 500 MG PO TABS
500.0000 mg | ORAL_TABLET | Freq: Four times a day (QID) | ORAL | Status: DC
  Administered 2011-07-23 – 2011-07-28 (×22): 500 mg via ORAL
  Filled 2011-07-23 (×24): qty 1

## 2011-07-23 NOTE — Progress Notes (Signed)
eLink Physician-Brief Progress Note Patient Name: [name] DOB: [birth year] MRN: [id number]  Date of Service  07/23/2011   HPI/Events of Note   Pt c/o nausea  eICU Interventions  See orders for zofran   Intervention Category Minor Interventions: Routine modifications to care plan (e.g. PRN medications for pain, fever)  Shan Levans 07/23/2011, 11:07 PM

## 2011-07-23 NOTE — Progress Notes (Signed)
3 Days Post-Op  Subjective: C/o some pain but controlled. No n/v. +air in bag  Objective: Vital signs in last 24 hours: Temp:  [98.2 F (36.8 C)-99.7 F (37.6 C)] 98.2 F (36.8 C) (12/30 0748) Pulse Rate:  [80-106] 80  (12/30 0700) Resp:  [15-26] 17  (12/30 0700) BP: (117-141)/(63-105) 118/76 mmHg (12/30 0700) SpO2:  [93 %-100 %] 96 % (12/30 0833) Arterial Line BP: (161)/(75) 161/75 mmHg (12/29 1000) FiO2 (%):  [2 %] 2 % (12/29 1157) Weight:  [246 lb (111.585 kg)] 246 lb (111.585 kg) (12/30 0500) Last BM Date: 07/21/11  Intake/Output from previous day: 12/29 0701 - 12/30 0700 In: 1880 [I.V.:1075; IV Piggyback:805] Out: 865 [Urine:865] Intake/Output this shift: Total I/O In: 260 [IV Piggyback:260] Out: -   Alert, ox3, nad cta ant Reg Obese, soft. Midline incision c/d/i with wicks. Ostomy viable some edema. +air in bag +scds  Lab Results:   Basename 07/23/11 0430 07/22/11 0400  WBC 8.9 9.1  HGB 8.8* 8.1*  HCT 25.2* 22.9*  PLT 187 178   BMET  Basename 07/23/11 0430 07/22/11 0400  NA 141 141  K 3.0* 3.2*  CL 108 110  CO2 26 22  GLUCOSE 95 104*  BUN 5* 7  CREATININE 0.49* 0.50  CALCIUM 7.2* 6.4*   PT/INR  Basename 07/21/11 0038 07/20/11 2152  LABPROT 19.8* 22.0*  INR 1.65* 1.89*   ABG  Basename 07/21/11 0950 07/21/11 0038  PHART 7.425 7.370  HCO3 21.9 24.4*    Studies/Results: Dg Chest Port 1 View  07/22/2011  *RADIOLOGY REPORT*  Clinical Data: Chest pain.  PORTABLE CHEST - 1 VIEW  Comparison: 07/21/2011  Findings: Endotracheal tube and nasogastric tube have been removed. Heart size and vascularity are normal.  Lungs are clear.  Evidence of previous left thoracotomy with corpectomy and mid thoracic spinal fusion.  IMPRESSION: No acute abnormalities.  Original Report Authenticated By: Gwynn Burly, M.D.    Anti-infectives: Anti-infectives     Start     Dose/Rate Route Frequency Ordered Stop   07/21/11 1200   micafungin (MYCAMINE) 100 mg in  sodium chloride 0.9 % 100 mL IVPB        100 mg 100 mL/hr over 1 Hours Intravenous Daily 07/21/11 1156     07/21/11 0130   ertapenem (INVANZ) 1 g in sodium chloride 0.9 % 50 mL IVPB        1 g 100 mL/hr over 30 Minutes Intravenous Daily at bedtime 07/21/11 0044     07/21/11 0045   ertapenem (INVANZ) 1 g in sodium chloride 0.9 % 50 mL IVPB  Status:  Discontinued        1 g 100 mL/hr over 30 Minutes Intravenous Every 24 hours 07/21/11 0033 07/21/11 0035   07/20/11 1945   ertapenem (INVANZ) 1 g in sodium chloride 0.9 % 50 mL IVPB        1 g 100 mL/hr over 30 Minutes Intravenous To [facility] 07/20/11 1931 07/20/11 2104          Assessment/Plan: s/p Procedure(s): EXPLORATORY LAPAROTOMY COLON RESECTION SIGMOID COLOSTOMY   Start clear liquids VTE prophylaxis - start subcu heparin PT/OT Cont foley for neurogenic bladder Will need to transition from IV steroids to po steroids over the next several day if tolerates liquids today Hypokalemia- replace potassium Anemia- hgb stable Ostomy consult ID- will contin IV abx for a total of 7 days (3/7)  [name] Sella. Andrey Campanile, MD, FACS General, Bariatric, & Minimally Invasive Surgery [facility]  [facility] [place]   LOS: 3 days    Gaynelle Adu M 07/23/2011

## 2011-07-23 NOTE — Progress Notes (Signed)
eLink Physician-Brief Progress Note Patient Name: [name] DOB: [birth year] MRN: [id number]  Date of Service  07/23/2011   HPI/Events of Note   HYPOphoshatemia  eICU Interventions  IV        Orvilla Truett 07/23/2011, 6:36 AM

## 2011-07-23 NOTE — Progress Notes (Signed)
Physical Therapy Evaluation Patient Details Name: [name] MRN: [id number] DOB: [birth year] Today's Date: 07/23/2011  Problem List:  Patient Active Problem List  Diagnoses  . OBESITY, NOS  . SPINAL CORD COMPRESSION  . Neurogenic bladder  . BACK PAIN, LUMBAR, WITH RADICULOPATHY  . DISEQUILIBRIUM  . ABDOMINAL PAIN, GENERALIZED  . Multiple myeloma  . Multiple myeloma  . Neurogenic bowel    Past Medical History:  Past Medical History  Diagnosis Date  . Multiple myeloma     thoracic T8 with paraplegia s/p resection- on chemo at visit 10/13/10  . Neurogenic bladder   . Neurogenic bowel   . Partial small bowel obstruction during dec 2011 admission  . Multiple myeloma   . Multiple myeloma without mention of remission   . Cancer    Past Surgical History:  Past Surgical History  Procedure Date  . Myeloma thoracic t8 with parpaplegia s/p thoracotomy and thoracic t7-9 cage placement on dec 26th 2011 07/18/10  . Laparotomy 07/20/2011    Procedure: EXPLORATORY LAPAROTOMY;  Surgeon: Rulon Abide, DO;  Location: [facility] OR;  Service: General;  Laterality: N/A;  . Colostomy revision 07/20/2011    Procedure: COLON RESECTION SIGMOID;  Surgeon: Rulon Abide, DO;  Location: [facility] OR;  Service: General;;  . Colostomy 07/20/2011    Procedure: COLOSTOMY;  Surgeon: Rulon Abide, DO;  Location: [facility] OR;  Service: General;;    PT Assessment/Plan/Recommendation PT Assessment Clinical Impression Statement: 55 yo male s/p exp lap and resection/colostomy presents with functional dependencies, and increased pain; At baseline level of function, pt is dependent on lift for transfers, has all necessary equipment at home; from a functional mobility standpoint, he is at baseline, though with more pain after surgery; No acute care PT needs noted, as pt is completely dependent for transfers at baseline; Recommend pt continue PT/therapies at [facility] program once dc'd home PT  Recommendation/Assessment: All further PT needs can be met in the next venue of care PT Therapy Diagnosis : Other (comment) (Paraperesis) PT Recommendation Recommendations for Other Services:  (Case Management to facilitate meeting needs for home) Follow Up Recommendations: 24 hour supervision/assistance;Outpatient PT ([facility]; [facility]PT if pt cannot return to [facility] soon after dc) Equipment Recommended:  (pt is well-equipped at home) PT Goals     PT Evaluation Precautions/Restrictions  Precautions Precautions:  (Frequent repositioning for skin integrity) Prior Functioning  Home Living Lives With: Family Receives Help From: Family Type of Home: House Home Layout: One level Home Access: Ramped entrance Home Adaptive Equipment: Hospital bed;Wheelchair - manual (hoyer lift) Additional Comments: Pt participates in [facility] program, an adult-day program; he is hoyered to his wheelchair, [facility] provides medical transport to center, where he has therapies and other activities during the day during the week Prior Function Level of Independence: Needs assistance with ADLs;Needs assistance with tranfers (hoyer lift for transfers) Able to Take Stairs?: No Driving: No Cognition Cognition Arousal/Alertness: Awake/alert Overall Cognitive Status: Appears within functional limits for tasks assessed Orientation Level: Oriented X4 Sensation/Coordination Sensation Light Touch: Impaired by gross assessment (per pt report, no sensation from approx T9 and below) Coordination Gross Motor Movements are Fluid and Coordinated: No Fine Motor Movements are Fluid and Coordinated: Not tested Coordination and Movement Description: no voluntary movement bil LEs; noted occasional spasm R and L LEs Extremity Assessment RUE Assessment RUE Assessment: Within Functional Limits LUE Assessment LUE Assessment: Within Functional Limits RLE Assessment RLE Assessment: Exceptions to [facility] RLE Strength RLE Overall Strength  Comments: no voluntary motion  noted throughout RLE Tone RLE Tone Comments: Noted occasional spasm LLE Assessment LLE Assessment: Exceptions to [facility] LLE Strength LLE Overall Strength Comments: no voluntary motion noted throughout Mobility (including Balance) Bed Mobility Bed Mobility: Yes Rolling Right: 3: Mod assist;With rail Rolling Right Details (indicate cue type and reason): Requires physical assist for LE management Rolling Left: 3: Mod assist;With rail Rolling Left Details (indicate cue type and reason): physical assist for LE management Left Sidelying to Sit: 1: +2 Total assist;Patient percentage (comment) (pt=30%) Left Sidelying to Sit Details (indicate cue type and reason): cues for handplacement and technique; good use of arms to push up, though still requires extensive assist Transfers Transfers: No (Nursing to use Maxi-sky lift for OOB) Ambulation/Gait Ambulation/Gait: No Stairs: No Wheelchair Mobility Wheelchair Mobility: No  Posture/Postural Control Posture/Postural Control: Postural limitations Postural Limitations: decr trunk stability, with heavy dependence on back support and bil UE support for sitting balance Balance Balance Assessed: Yes Static Sitting Balance Static Sitting - Balance Support: Bilateral upper extremity supported;Feet supported Static Sitting - Level of Assistance: 2: Max assist Static Sitting - Comment/# of Minutes: EOB approx 5 minutes with full back support; worked on deep breathing , use of incentive spirometer    End of Session PT - End of Session Activity Tolerance: Patient limited by pain;Patient tolerated treatment well Patient left: in bed (Nursing to use lift for safe transfers OOB) Nurse Communication: Mobility status for transfers General Behavior During Session: [facility] for tasks performed Cognition: [facility] for tasks performed  Van Clines [name] [place], [place] [phone]   07/23/2011, 1:17 PM

## 2011-07-24 LAB — GLUCOSE, CAPILLARY
Glucose-Capillary: 102 mg/dL — ABNORMAL HIGH (ref 70–99)
Glucose-Capillary: 112 mg/dL — ABNORMAL HIGH (ref 70–99)
Glucose-Capillary: 119 mg/dL — ABNORMAL HIGH (ref 70–99)
Glucose-Capillary: 87 mg/dL (ref 70–99)
Glucose-Capillary: 98 mg/dL (ref 70–99)
Glucose-Capillary: 99 mg/dL (ref 70–99)

## 2011-07-24 LAB — DIFFERENTIAL
Basophils Absolute: 0 10*3/uL (ref 0.0–0.1)
Basophils Relative: 0 % (ref 0–1)
Eosinophils Absolute: 0 10*3/uL (ref 0.0–0.7)
Eosinophils Relative: 0 % (ref 0–5)
Lymphocytes Relative: 8 % — ABNORMAL LOW (ref 12–46)
Lymphs Abs: 0.6 10*3/uL — ABNORMAL LOW (ref 0.7–4.0)
Monocytes Absolute: 0.6 10*3/uL (ref 0.1–1.0)
Monocytes Relative: 8 % (ref 3–12)
Neutro Abs: 6.2 10*3/uL (ref 1.7–7.7)
Neutrophils Relative %: 84 % — ABNORMAL HIGH (ref 43–77)

## 2011-07-24 LAB — TYPE AND SCREEN
ABO/RH(D): O POS
Antibody Screen: NEGATIVE
Unit division: 0
Unit division: 0

## 2011-07-24 LAB — CBC
HCT: 23 % — ABNORMAL LOW (ref 39.0–52.0)
Hemoglobin: 8 g/dL — ABNORMAL LOW (ref 13.0–17.0)
MCH: 29 pg (ref 26.0–34.0)
MCHC: 34.8 g/dL (ref 30.0–36.0)
MCV: 83.3 fL (ref 78.0–100.0)
Platelets: 257 10*3/uL (ref 150–400)
RBC: 2.76 MIL/uL — ABNORMAL LOW (ref 4.22–5.81)
RDW: 16.8 % — ABNORMAL HIGH (ref 11.5–15.5)
WBC: 7.4 10*3/uL (ref 4.0–10.5)

## 2011-07-24 LAB — BASIC METABOLIC PANEL
BUN: 6 mg/dL (ref 6–23)
CO2: 27 mEq/L (ref 19–32)
Calcium: 7 mg/dL — ABNORMAL LOW (ref 8.4–10.5)
Chloride: 108 mEq/L (ref 96–112)
Creatinine, Ser: 0.5 mg/dL (ref 0.50–1.35)
GFR calc Af Amer: >90 mL/min (ref >90)
GFR calc non Af Amer: >90 mL/min (ref >90)
Glucose, Bld: 109 mg/dL — ABNORMAL HIGH (ref 70–99)
Potassium: 3.3 mEq/L — ABNORMAL LOW (ref 3.5–5.1)
Sodium: 141 mEq/L (ref 135–145)

## 2011-07-24 LAB — MAGNESIUM: Magnesium: 2.3 mg/dL (ref 1.5–2.5)

## 2011-07-24 MED ORDER — PANTOPRAZOLE SODIUM 40 MG PO TBEC
40.0000 mg | DELAYED_RELEASE_TABLET | Freq: Every day | ORAL | Status: DC
  Administered 2011-07-25 – 2011-07-27 (×4): 40 mg via ORAL
  Filled 2011-07-24 (×4): qty 1

## 2011-07-24 MED ORDER — POTASSIUM CHLORIDE CRYS ER 20 MEQ PO TBCR
40.0000 meq | EXTENDED_RELEASE_TABLET | Freq: Once | ORAL | Status: AC
  Administered 2011-07-24: 40 meq via ORAL
  Filled 2011-07-24: qty 2

## 2011-07-24 NOTE — Progress Notes (Signed)
UR Completed.  [name] [name] [phone]. 07/24/2011  

## 2011-07-24 NOTE — Progress Notes (Signed)
[facility]/[name] Progress Note/POD # 4 4 Days Post-Op  Subjective: The patient has ostomy output.  Awake alert, and oriented.  Objective: Vital signs in last 24 hours: Temp:  [98.1 F (36.7 C)-99.5 F (37.5 C)] 98.5 F (36.9 C) (12/31 0728) Pulse Rate:  [66-99] 75  (12/31 0800) Resp:  [13-27] 18  (12/31 0800) BP: (121-146)/(71-93) 133/75 mmHg (12/31 0800) SpO2:  [89 %-99 %] 96 % (12/31 0836) Weight:  [248 lb 7.3 oz (112.7 kg)] 248 lb 7.3 oz (112.7 kg) (12/31 0435) Last BM Date: 07/23/11  Intake/Output from previous day: 12/30 0701 - 12/31 0700 In: 2295 [P.O.:880; I.V.:625; IV Piggyback:790] Out: 1155 [Urine:855; Stool:300] Intake/Output this shift: Total I/O In: 410 [P.O.:360; I.V.:50] Out: 275 [Urine:275]  General: No acute distress  Lungs: Clear, oxygen saturations.  Abd: Soft, distended, good ostomy output, midline wound looks okay.  Minimal bowel sounds  Extremities: No changes.  Neuro: Intact  Lab Results:  @LABLAST2 (wbc:2,hgb:2,hct:2,plt:2) BMET  Basename 07/24/11 0429 07/23/11 0430  NA 141 141  K 3.3* 3.0*  CL 108 108  CO2 27 26  GLUCOSE 109* 95  BUN 6 5*  CREATININE 0.50 0.49*  CALCIUM 7.0* 7.2*   PT/INR No results found for this basename: LABPROT:2,INR:2 in the last 72 hours ABG  Basename 07/21/11 0950  PHART 7.425  HCO3 21.9    Studies/Results: No results found.  Anti-infectives: Anti-infectives     Start     Dose/Rate Route Frequency Ordered Stop   07/21/11 1200   micafungin (MYCAMINE) 100 mg in sodium chloride 0.9 % 100 mL IVPB        100 mg 100 mL/hr over 1 Hours Intravenous Daily 07/21/11 1156     07/21/11 0130   ertapenem (INVANZ) 1 g in sodium chloride 0.9 % 50 mL IVPB        1 g 100 mL/hr over 30 Minutes Intravenous Daily at bedtime 07/21/11 0044     07/21/11 0045   ertapenem (INVANZ) 1 g in sodium chloride 0.9 % 50 mL IVPB  Status:  Discontinued        1 g 100 mL/hr over 30 Minutes Intravenous Every 24 hours 07/21/11 0033  07/21/11 0035   07/20/11 1945   ertapenem (INVANZ) 1 g in sodium chloride 0.9 % 50 mL IVPB        1 g 100 mL/hr over 30 Minutes Intravenous [facility] 07/20/11 1931 07/20/11 2104          Assessment/Plan: s/p Procedure(s): EXPLORATORY LAPAROTOMY COLON RESECTION SIGMOID COLOSTOMY Advance diet Transfer to floor Air overlay bed. Decrease IVF. Will ask Dr. Biagio Quint about how he wants to manage the wicks in his abdominal wound.  LOS: 4 days   [name]. Gae Bon, MD, FACS [phone] [facility] 07/24/2011

## 2011-07-24 NOTE — Consult Note (Signed)
Ostomy follow-up:  Demonstrated pouch change to wife and patient.  Stoma red and viable, above skin level, mod brown liquid stool in pouch.  Wife able to apply and empty pouch.  Discussed pouching routines and ordering supplies. Wife requests another teaching session Fri at 2:45 W Shlok Raz, RN, MSN, [employer]  [phone]

## 2011-07-24 NOTE — Progress Notes (Signed)
[employer] faxed confirmation of revlimid presription shipment.  Shipped 07-21-11 with next business day delivery.

## 2011-07-24 NOTE — Progress Notes (Signed)
Pt says he takes Lenalidomide 15 mg every day for multiple myeloma. Not ordered here . Please address with patient.Thanks J. Fanny Bien

## 2011-07-25 LAB — GLUCOSE, CAPILLARY
Glucose-Capillary: 104 mg/dL — ABNORMAL HIGH (ref 70–99)
Glucose-Capillary: 97 mg/dL (ref 70–99)
Glucose-Capillary: 103 mg/dL — ABNORMAL HIGH (ref 70–99)
Glucose-Capillary: 121 mg/dL — ABNORMAL HIGH (ref 70–99)
Glucose-Capillary: 134 mg/dL — ABNORMAL HIGH (ref 70–99)

## 2011-07-25 MED ORDER — IPRATROPIUM-ALBUTEROL 18-103 MCG/ACT IN AERO
2.0000 | INHALATION_SPRAY | Freq: Three times a day (TID) | RESPIRATORY_TRACT | Status: DC
  Administered 2011-07-25 – 2011-07-28 (×10): 2 via RESPIRATORY_TRACT
  Filled 2011-07-25: qty 14.7

## 2011-07-25 MED ORDER — LENALIDOMIDE 15 MG PO CAPS
15.0000 mg | ORAL_CAPSULE | Freq: Every day | ORAL | Status: DC
  Filled 2011-07-25 (×3): qty 1

## 2011-07-25 NOTE — Progress Notes (Signed)
Patient ID: [name], male   DOB: 02/24/1956, 56 y.o.   MRN: [id number] 5 Days Post-Op  Subjective: Pt feels well.  Getting up to the chair.  Tolerating clear liquids.  Not much appetite.  Objective: Vital signs in last 24 hours: Temp:  [97.9 F (36.6 C)-98.3 F (36.8 C)] 98.3 F (36.8 C) (01/01 0615) Pulse Rate:  [65-79] 65  (01/01 0615) Resp:  [14-18] 18  (01/01 0615) BP: (116-142)/(80-88) 136/80 mmHg (01/01 0615) SpO2:  [93 %-100 %] 94 % (01/01 0615) Last BM Date: 07/25/11  Intake/Output from previous day: 12/31 0701 - 01/01 0700 In: 750 [P.O.:600; I.V.:150] Out: 2500 [Urine:2300; Stool:200] Intake/Output this shift: Total I/O In: 240 [P.O.:240] Out: 200 [Stool:200]  PE: Abd: soft, minimally tender, +BS, obese, ostomy with feculent output and air.  Stoma pink and viable.  Wound clean.  Wicks removed.  Lab Results:   [facility] 07/24/11 0429 07/23/11 0430  WBC 7.4 8.9  HGB 8.0* 8.8*  HCT 23.0* 25.2*  PLT 257 187   BMET  Basename 07/24/11 0429 07/23/11 0430  NA 141 141  K 3.3* 3.0*  CL 108 108  CO2 27 26  GLUCOSE 109* 95  BUN 6 5*  CREATININE 0.50 0.49*  CALCIUM 7.0* 7.2*   PT/INR No results found for this basename: LABPROT:2,INR:2 in the last 72 hours   Studies/Results: No results found.  Anti-infectives: Anti-infectives     Start     Dose/Rate Route Frequency Ordered Stop   07/21/11 1200   micafungin (MYCAMINE) 100 mg in sodium chloride 0.9 % 100 mL IVPB        100 mg 100 mL/hr over 1 Hours Intravenous Daily 07/21/11 1156     07/21/11 0130   ertapenem (INVANZ) 1 g in sodium chloride 0.9 % 50 mL IVPB        1 g 100 mL/hr over 30 Minutes Intravenous Daily at bedtime 07/21/11 0044     07/21/11 0045   ertapenem (INVANZ) 1 g in sodium chloride 0.9 % 50 mL IVPB  Status:  Discontinued        1 g 100 mL/hr over 30 Minutes Intravenous Every 24 hours 07/21/11 0033 07/21/11 0035   07/20/11 1945   ertapenem (INVANZ) 1 g in sodium chloride 0.9 % 50 mL  IVPB        1 g 100 mL/hr over 30 Minutes Intra[facility]ous [facility] 07/20/11 1931 07/20/11 2104           Assessment/Plan  1. S/p Hartmann's 2. Spinal cord compression 3. Neurogenic bladder  Plan: 1. Adv diet to full liquids 2. Cont out of bed to chair. 3. Cont abx   LOS: 5 days    [name] E 07/25/2011

## 2011-07-25 NOTE — Progress Notes (Signed)
As per note of KO,PA. He feels good, tolerated liquids, has a benign abdomen and lots of stool vis the ostomy

## 2011-07-26 DIAGNOSIS — M7989 Other specified soft tissue disorders: Secondary | ICD-10-CM

## 2011-07-26 DIAGNOSIS — K5720 Diverticulitis of large intestine with perforation and abscess without bleeding: Secondary | ICD-10-CM | POA: Diagnosis present

## 2011-07-26 LAB — GLUCOSE, CAPILLARY
Glucose-Capillary: 115 mg/dL — ABNORMAL HIGH (ref 70–99)
Glucose-Capillary: 120 mg/dL — ABNORMAL HIGH (ref 70–99)
Glucose-Capillary: 127 mg/dL — ABNORMAL HIGH (ref 70–99)
Glucose-Capillary: 132 mg/dL — ABNORMAL HIGH (ref 70–99)
Glucose-Capillary: 83 mg/dL (ref 70–99)
Glucose-Capillary: 96 mg/dL (ref 70–99)

## 2011-07-26 MED ORDER — LENALIDOMIDE 15 MG PO CAPS
15.0000 mg | ORAL_CAPSULE | Freq: Every day | ORAL | Status: DC
  Administered 2011-07-26 – 2011-07-27 (×2): 15 mg via ORAL

## 2011-07-26 NOTE — Progress Notes (Signed)
VASCULAR LAB PRELIMINARY  PRELIMINARY  PRELIMINARY  PRELIMINARY  Left upper extremity venous duplex  completed.    Preliminary report:  Superficial thrombosis noted in the left cephalic vein from the mid forearm to wrist. All other veins patent    Terance Hart, RVT 07/26/2011, 11:25 AM

## 2011-07-26 NOTE — Progress Notes (Signed)
6 Days Post-Op  Subjective: C/o (L)UE swelling, mostly in hand. No pain, no loss of sensation. Tolerating fulls, no N/V Otherwise feels well.  Objective: Vital signs in last 24 hours: Temp:  [97.9 F (36.6 C)-98.4 F (36.9 C)] 98 F (36.7 C) (01/02 0534) Pulse Rate:  [79-102] 79  (01/02 0534) Resp:  [18] 18  (01/02 0534) BP: (116-136)/(74-92) 136/92 mmHg (01/02 0534) SpO2:  [93 %-99 %] 94 % (01/02 0534) Weight:  [107.321 kg (236 lb 9.6 oz)] 236 lb 9.6 oz (107.321 kg) (01/02 0534) Last BM Date: 07/25/11  Intake/Output this shift:    Physical Exam: BP 136/92  Pulse 79  Temp(Src) 98 F (36.7 C) (Oral)  Resp 18  Ht 5\' 9"  (1.753 m)  Wt 107.321 kg (236 lb 9.6 oz)  BMI 34.94 kg/m2  SpO2 94% Abdomen: wound clean, small gaps in incision from wicks remain open and weep some SS fluid, but no pus or odor. Otherwise soft, NT Ostomy looks good, positive output.  Labs: CBC  Basename 07/24/11 0429  WBC 7.4  HGB 8.0*  HCT 23.0*  PLT 257   BMET  Basename 07/24/11 0429  NA 141  K 3.3*  CL 108  CO2 27  GLUCOSE 109*  BUN 6  CREATININE 0.50  CALCIUM 7.0*   LFT No results found for this basename: PROT,ALBUMIN,AST,ALT,ALKPHOS,BILITOT,BILIDIR,IBILI,LIPASE in the last 72 hours PT/INR No results found for this basename: LABPROT:2,INR:2 in the last 72 hours ABG No results found for this basename: PHART:2,PCO2:2,PO2:2,HCO3:2 in the last 72 hours  Studies/Results: No results found.  Assessment: Principal Problem:  *Diverticulitis of large intestine with perforation (L)UE swelling, recent PICC placement   Procedure(s): EXPLORATORY LAPAROTOMY COLON RESECTION SIGMOID COLOSTOMY  Plan: Check (L)UE duplex for DVT, already on sub-q heparin Advance diet. Change clean dressing.  LOS: 6 days    [name] 07/26/2011

## 2011-07-27 LAB — CULTURE, BLOOD (ROUTINE X 2)
Culture  Setup Time: 201212280856
Culture: NO GROWTH

## 2011-07-27 LAB — GLUCOSE, CAPILLARY
Glucose-Capillary: 112 mg/dL — ABNORMAL HIGH (ref 70–99)
Glucose-Capillary: 114 mg/dL — ABNORMAL HIGH (ref 70–99)
Glucose-Capillary: 121 mg/dL — ABNORMAL HIGH (ref 70–99)
Glucose-Capillary: 122 mg/dL — ABNORMAL HIGH (ref 70–99)

## 2011-07-27 NOTE — Consult Note (Signed)
Ostomy follow-up:  Demonstrated ostomy pouch change using mirror.  Pt able to empty into urinal balanced on his lap and close velcro without assistance.  Able to apply pouch with some assistance when mirror balanced in lap.  Stoma red and viable, above skin level, 50cc liquid stool in pouch.  Discussed ordering supplies and ostomy routines.  Educational materials left at bedside.  Placed on [facility] discharge program. If patient still in hospital tomorrow, wife has requested another teaching session at 2:45. Wife had previous pouching session earlier this week. He has caregiver assistance at home and at [facility] who are familiar with pouching activities, according to patient.   Cammie Mcgee, RN, MSN, [employer]  [phone]

## 2011-07-27 NOTE — Progress Notes (Signed)
7 Days Post-Op  Subjective: Pt ok. (L)UE duplex is positive for superficial thrombosis of cephalic vein. (L)hand still puffy but no pain. Tol reg diet.  Objective: Vital signs in last 24 hours: Temp:  [98.1 F (36.7 C)-98.3 F (36.8 C)] 98.1 F (36.7 C) (01/03 0505) Pulse Rate:  [74-85] 74  (01/03 0505) Resp:  [18-20] 18  (01/03 0505) BP: (131-136)/(73-88) 131/73 mmHg (01/03 0505) SpO2:  [94 %-100 %] 97 % (01/03 0951) Weight:  [234 lb 11.2 oz (106.459 kg)] 234 lb 11.2 oz (106.459 kg) (01/03 0505) Last BM Date: 07/26/11  Intake/Output this shift:    Physical Exam: BP 131/73  Pulse 74  Temp(Src) 98.1 F (36.7 C) (Oral)  Resp 18  Ht 5\' 9"  (1.753 m)  Wt 234 lb 11.2 oz (106.459 kg)  BMI 34.66 kg/m2  SpO2 97% Abdomen: soft, NT, ND Incision c.d.i Ostomy pink, viable, good output.  Labs: CBC No results found for this basename: WBC:2,HGB:2,HCT:2,PLT:2 in the last 72 hours BMET No results found for this basename: NA:2,K:2,CL:2,CO2:2,GLUCOSE:2,BUN:2,CREATININE:2,CALCIUM:2 in the last 72 hours LFT No results found for this basename: PROT,ALBUMIN,AST,ALT,ALKPHOS,BILITOT,BILIDIR,IBILI,LIPASE in the last 72 hours PT/INR No results found for this basename: LABPROT:2,INR:2 in the last 72 hours ABG No results found for this basename: PHART:2,PCO2:2,PO2:2,HCO3:2 in the last 72 hours  Studies/Results: No results found.  Assessment: Principal Problem:  *Diverticulitis of large intestine with perforation (L)UE Superficial cephalic vein thrombosis  Procedure(s): EXPLORATORY LAPAROTOMY COLON RESECTION SIGMOID COLOSTOMY  Plan: Ostomy teaching for pt and wife. He is in "[facility]" program and should be able to go back. Anticipate discharge tomorrow after wife can do ostomy teaching with [facility]. Keep (L)UE elevated, no need for therapeutic anticoagulation.  LOS: 7 days    Marianna Fuss 07/27/2011

## 2011-07-27 NOTE — Progress Notes (Signed)
Pt is a participant in the [facility] program in [place].  Spoke with RN there and they are able to receive the patient on day of discharge directly from the hospital, but they had met about this patient and determined that it would be better for him if he discharged home.  They have already arranged for [facility] to provide home care.     They need the following information to accompany the patient:  Discharge summary and AVS with info on ostomy supplies.    Pt will need 3 days worth of supplies until his arrive.  [facility] has already ordered them for pt.   Fax number is [phone], Attention Annice Pih, Dr. Dorothe Pea, for the d/c summary and AVS.   Pt will need ambulance transport home.

## 2011-07-28 ENCOUNTER — Inpatient Hospital Stay (HOSPITAL_COMMUNITY): Payer: Medicare (Managed Care)

## 2011-07-28 LAB — URINALYSIS, ROUTINE W REFLEX MICROSCOPIC
Bilirubin Urine: NEGATIVE
Glucose, UA: NEGATIVE mg/dL
Hgb urine dipstick: NEGATIVE
Ketones, ur: NEGATIVE mg/dL
Leukocytes, UA: NEGATIVE
Nitrite: NEGATIVE
Protein, ur: NEGATIVE mg/dL
Specific Gravity, Urine: 1.01 (ref 1.005–1.030)
Urobilinogen, UA: 0.2 mg/dL (ref 0.0–1.0)
pH: 5.5 (ref 5.0–8.0)

## 2011-07-28 MED ORDER — OXYCODONE HCL 5 MG PO TABS: 5.0000 mg | ORAL_TABLET | ORAL | Status: DC | PRN

## 2011-07-28 NOTE — Discharge Summary (Signed)
Physician Discharge Summary  Patient ID: [name] MRN: [id number] DOB/AGE: Apr 29, 1956 56 y.o.  Admit date: 07/20/2011 Discharge date: 07/28/2011  Admission Diagnoses: Abdominal pain and free air. Discharge Diagnoses:  Principal Problem:  Sigmoid diverticulitis with perforation. Other Active Problems: Multiple Myeloma Hx Bilateral DVT  Procedure(s): EXPLORATORY LAPAROTOMY COLON RESECTION SIGMOID COLOSTOMY  Discharged Condition: good  Hospital Course:  HPI: This patient presented to the [facility] emergency room for evaluation of abdominal distention and cough and free air found on abdominal x-ray today. He is a patient of Dr. Manon Hilding has a complicated history of multiple myeloma and T8 paraplegia, adrenal insufficiency and chronic steroid use history of C. Difficile colitis and bilateral DVTs on anticoagulation. He stateed that over the last month he has had abdominal distention where his abdomen is "tight" and has had abdominal pain with movement and turning which he describes as "pain". This distention has been present for approximately one month and he denies any recent changes in the last few days other than a "cold" with cough and phlegm. He denied any fevers or chills or nausea or vomiting. He stated his bowels are normal and denied any blood in the stool or melena. He denied heartburn but takes Protonix daily for an unknown reason for the patient history. He denied any NSAID use, aspirin, or Motrin, or Goody powders. He stated he had a bowel movement the morning of admission which was normal. He came to the ER and workup found evidence of free air. His INR was elevated, but decision was made for prompt coreection of this and then to proceed to the OR for operative intervention.  Summary of Hospital Course: Pt admitted on 12/27. His Coumadin was quickly reversed with FFP and he was taken to the OR. He underwent exploratory laparotomy and was found to have perforated sigmoid  colon. Sigmoid colectomy with end colostomy and Hartman's pouch were created. He was admitted back to the ICU for continued post-op management. He was extubated and stabilized and was then transferred to the floor by post op day 2. He began having some bowel function into his ostomy and liquid diet was started, this was subsequently advanced as tolerated. He did develop some (L)forearm and hand swelling and was found to have a superficial (L)cephalic thrombosis by duplex. No additional anticoagulation was started. He's been maintained on daily steroids for his MM. He has otherwise done well with ostomy training. No significant wound issues were encountered, nor any other post-op complications. We feel he is stable for discharge. He will resume his home meds.   Consults: pulmonary/intensive care   Discharge Exam: Blood pressure 143/79, pulse 66, temperature 98.1 F (36.7 C), temperature source Oral, resp. rate 18, height 5\' 9"  (1.753 m), weight 106.459 kg (234 lb 11.2 oz), SpO2 94.00%. General appearance: alert, cooperative and no distress Resp: clear to auscultation bilaterally Cardio: regular rate and rhythm, S1, S2 normal, no murmur, click, rub or gallop GI: soft, non-tender; bowel sounds normal; no masses,  no organomegaly Incision/Wound:c/d/i, ostomy pink and viable, good function.  Disposition: Home or Self Care  Discharge Orders    Future Appointments: Provider: Department: Dept Phone: Center:   08/01/2011 8:00 AM Stephanie Acre [facility] [phone] None   08/01/2011 8:30 AM Pierce Crane, MD [facility] [phone] None   08/01/2011 9:30 AM [facility] [phone] None     Discharge meds: Prescriptions prior to admission  Medication Sig Dispense Refill  . acetaminophen (TYLENOL) 325 MG tablet Take 325-650  mg by mouth every 6 (six) hours as needed. For pain.       Marland Kitchen dexamethasone (DECADRON) 2 MG tablet Take 2-4 mg by mouth 2 (two) times daily with a meal.  Alternates on days 5-28 of each month between 2 MG daily and 4 MG daily.       Marland Kitchen dexamethasone (DECADRON) 4 MG tablet Take 10 mg by mouth daily. On the 1-4 of each month.       . lenalidomide (REVLIMID) 15 MG capsule Take 15 mg by mouth daily.        Marland Kitchen LORazepam (ATIVAN) 0.5 MG tablet Take 0.25-0.5 mg by mouth every 6 (six) hours as needed. For anxiety.       . methocarbamol (ROBAXIN) 500 MG tablet Take 500 mg by mouth every 6 (six) hours as needed. For muscle spasms.      Marland Kitchen oxyCODONE (OXY IR/ROXICODONE) 5 MG immediate release tablet Take 5 mg by mouth 2 (two) times daily as needed. For pain.       . pantoprazole (PROTONIX) 40 MG tablet Take 40 mg by mouth daily.       .        . sulfamethoxazole-trimethoprim (BACTRIM DS) 800-160 MG per tablet Take 1 tablet by mouth daily. Takes on Mondays, Wednesdays, and Fridays.       Marland Kitchen warfarin (COUMADIN) 5 MG tablet Take 5 mg by mouth daily. Mondays take 1/2 tablet (2.5 MG). All other days take 1 tablet (5 MG)        Follow-up Information    Follow up with LAYTON, BRIAN DAVID, DO. Make an appointment in 1 week.   Contact information:   [address] [place] [address] [phone]          Signed: Marianna Fuss 07/28/2011, 2:32 PM

## 2011-07-28 NOTE — Progress Notes (Signed)
D/C instructions given to wife and pt both verbalized understanding. Chemo drug given back to pt. [facility] picked up pts wheelchair. Pt left via EMS.

## 2011-07-28 NOTE — Consult Note (Signed)
Ostomy follow-up:  Wife at bedside.  Pt able to empty and reapply velcro closure.  Demonstrated pouch application and wife and patient assisted.  Barrier ring and 2 piece applied.  Stoma red and viable, mod brown unformed stool in pouch.  Reviewed ordering supplies and ostomy routines.  Pt has assistance at home with [facility] and [facility].  Pt and wife deny further questions at present. Pt has been placed on [facility] discharge program.  Cammie Mcgee, RN, MSN, [employer]  [phone]

## 2011-07-28 NOTE — Progress Notes (Signed)
Clinical Social Worker received referral for transportation. CSW completed assessment, please see shadow chart for details. CSW met with pt and explained transportation process and provided opportunity for pt to express concerns and/or questions.  CSW to arrange transport at time of dc.   Angelia Mould, MSW, [place] [phone]

## 2011-07-28 NOTE — Progress Notes (Signed)
8 Days Post-Op  Subjective: Pt ok. C/o mild cough, no CP, SOB Wife also concerned that urine was cloudy last pm. Otherwise eating well, minimal pain  Objective: Vital signs in last 24 hours: Temp:  [97.7 F (36.5 C)-98.1 F (36.7 C)] 98.1 F (36.7 C) (01/04 0604) Pulse Rate:  [66-112] 66  (01/04 0604) Resp:  [18] 18  (01/04 0604) BP: (134-143)/(79-90) 143/79 mmHg (01/04 0604) SpO2:  [97 %-100 %] 97 % (01/04 0604) Last BM Date: 07/27/11  Intake/Output this shift:    Physical Exam: BP 143/79  Pulse 66  Temp(Src) 98.1 F (36.7 C) (Oral)  Resp 18  Ht 5\' 9"  (1.753 m)  Wt 106.459 kg (234 lb 11.2 oz)  BMI 34.66 kg/m2  SpO2 97% Lungs: CTA without w/r/r Heart: Regular Abdomen: soft, ND, appropriately tender   Incisions all c/d/i without erythema or hematoma. Ext: No edema or tenderness Urine looks very clear this am.   Assessment: Principal Problem:  *Diverticulitis of large intestine with perforation   Procedure(s): EXPLORATORY LAPAROTOMY COLON RESECTION SIGMOID COLOSTOMY  Plan: Will check UA and CXR Ostomy teaching for wife later today, anticipate DC later today  LOS: 8 days    Algernon Mundie F 07/28/2011  Addendum: 1430 CXR and UA negative OK to DC

## 2011-07-31 ENCOUNTER — Telehealth: Payer: Self-pay | Admitting: *Deleted

## 2011-07-31 NOTE — Telephone Encounter (Signed)
Andria Rhein RN called reporting patient is weak, has staples and a new colostomy.  Would like to know if tomorrow's lab/MD f/u and chemo treatment can be rescheduled to 08-09-11.  He was hospitalized, in ICU and d/c'd 07-28-11 and his wife would like him to rest at home if possible.  He is to f/u at [facility] on 08-07-11, has an appt. 08-08-11 so he can f/u on 08-09-11.  Asked if this is a problem for his treatment regimen.  If he needs MD follow up with lab and treatment rescheduled let us know.  [name] is a program to help people remain at home instead of a hospital or facility.  We manage their care.

## 2011-08-01 ENCOUNTER — Ambulatory Visit: Payer: Medicare (Managed Care) | Admitting: Oncology

## 2011-08-01 ENCOUNTER — Other Ambulatory Visit: Payer: Medicare (Managed Care) | Admitting: Lab

## 2011-08-01 ENCOUNTER — Ambulatory Visit: Payer: Medicare (Managed Care)

## 2011-08-08 ENCOUNTER — Ambulatory Visit (INDEPENDENT_AMBULATORY_CARE_PROVIDER_SITE_OTHER): Payer: Medicare (Managed Care) | Admitting: General Surgery

## 2011-08-08 ENCOUNTER — Encounter (INDEPENDENT_AMBULATORY_CARE_PROVIDER_SITE_OTHER): Payer: Self-pay | Admitting: General Surgery

## 2011-08-08 ENCOUNTER — Ambulatory Visit
Admission: RE | Admit: 2011-08-08 | Discharge: 2011-08-08 | Disposition: A | Discharge status: Home / Self Care | Payer: PRIVATE HEALTH INSURANCE | Source: Ambulatory Visit | Attending: Family Medicine | Admitting: Family Medicine

## 2011-08-08 ENCOUNTER — Other Ambulatory Visit: Payer: Self-pay | Admitting: Family Medicine

## 2011-08-08 VITALS — BP 118/83 | HR 72 | Temp 97.6°F | Resp 18 | Ht 68.5 in

## 2011-08-08 DIAGNOSIS — Z4889 Encounter for other specified surgical aftercare: Secondary | ICD-10-CM

## 2011-08-08 DIAGNOSIS — R0989 Other specified symptoms and signs involving the circulatory and respiratory systems: Secondary | ICD-10-CM

## 2011-08-08 DIAGNOSIS — Z5189 Encounter for other specified aftercare: Secondary | ICD-10-CM

## 2011-08-08 NOTE — Progress Notes (Signed)
Subjective:     Patient ID: [name], male   DOB: 1955-09-18, 56 y.o.   MRN: [id number]  HPI This patient follows up 3 weeks status post exploratory laparotomy and sigmoid colectomy with end colostomy for treatment of a perforated sigmoid colon. She is out of hospital and is doing fairly well. He is near his baseline and his only complaint is some right leg swelling and cough which has been persistent and present even since prior to surgery. As a productive cough and his artery scheduled for chest x-ray today by his primary physician. He's had right leg swelling since his procedure as well but is anticoagulated and has an IVC filter in place already. His ostomy is functioning well and is having no issues with this. His pathology was benign.  Review of Systems     Objective:   Physical Exam No acute distress and nontoxic-appearing syncopal in his wheelchair  His abdomen is soft and nontender on exam is nondistended and and his incision is healing well without sign of infection. Closely loosely and for the most part is nearly completely healed he has one the sites near the inferior aspect of the incision that had wicks which is still open but has healthy granulation tissue at the base. There is no evidence of hernia and his ostomy is functioning well. His staples were removed and benzoin and Steri-Strips were applied.    Assessment:     Status post sigmoid colectomy for perforated colon-doing well His seems to be doing well from this procedure and his incision is healing well I did recommend wet-to-dry dressings for the inferior aspect of this incision and this should heal within another week or 2.  Right leg swelling He does have some right-sided leg swelling and am unsure if the cause of this.  I considered obtaining a duplex ultrasound to evaluate for DVT however, he is already coagulated and has an IVC filter in place so even if he does have a DVT he has artery on the current  treatment.  Cough He still has a productive cough which was present prior to surgery he is scheduled to obtain a chest x-ray today and this is being treated in evaluated by his primary physician.    Plan:     He can followup with me in 2-3 weeks if he has any questions or concerns or if this wound does not heal completely otherwise he can follow up on a p.r.n. basis. I would not recommend ostomy reversal in this patient who is wheelchair-bound I think this will allow him to be able to care for himself and minimize the chance of decubitus ulcers and some and was paralyzed and unable to use the restroom without assistance.

## 2011-08-09 ENCOUNTER — Telehealth (INDEPENDENT_AMBULATORY_CARE_PROVIDER_SITE_OTHER): Payer: Self-pay

## 2011-08-09 NOTE — Telephone Encounter (Signed)
Annice Pih at Dr Malon Kindle office called to verify wound care instructions. She states she helps pt with wound care.  Notes read to her from 1-15 office visit. She will call with any questions or if wound does not continue to heal.

## 2011-08-10 ENCOUNTER — Encounter: Payer: Self-pay | Admitting: Physician Assistant

## 2011-08-10 ENCOUNTER — Telehealth: Payer: Self-pay

## 2011-08-10 ENCOUNTER — Other Ambulatory Visit: Payer: Self-pay

## 2011-08-10 ENCOUNTER — Other Ambulatory Visit: Payer: Self-pay | Admitting: Physician Assistant

## 2011-08-10 NOTE — Telephone Encounter (Signed)
Received call from pt's spouse Annice Pih requesting a letter for pt's daughter, Bethanie Dicker (dob 04/23/1989) for school.  Ms Chilton Si was attending [employer] when pt was diagnosed in 12/11.  Letter is for financial reimbursement, d/t daughter leaving school to assist with care of Mr Crochet, as well as younger siblings while Ms. Wempe worked full time.  Annice Pih will pick up letter when ready.  Ms Hyneman also reports pt missed lab, MD and chemo appts scheduled for 1/11 d/t recent sigmoid colectomy with end colostomy for perforated colon.  She is concerned that these appts have not been rescheduled.    Note to [place], [place].  Ms Schoch aware that Dr Donnie Coffin is not in the office today, therefore she will expect a call back no earlier than tomorrow.  dph

## 2011-08-15 ENCOUNTER — Encounter (HOSPITAL_BASED_OUTPATIENT_CLINIC_OR_DEPARTMENT_OTHER): Payer: Medicare (Managed Care) | Attending: General Surgery

## 2011-08-15 DIAGNOSIS — L989 Disorder of the skin and subcutaneous tissue, unspecified: Secondary | ICD-10-CM | POA: Insufficient documentation

## 2011-08-15 DIAGNOSIS — L8990 Pressure ulcer of unspecified site, unspecified stage: Secondary | ICD-10-CM | POA: Insufficient documentation

## 2011-08-15 DIAGNOSIS — L97309 Non-pressure chronic ulcer of unspecified ankle with unspecified severity: Secondary | ICD-10-CM | POA: Insufficient documentation

## 2011-08-15 DIAGNOSIS — G8220 Paraplegia, unspecified: Secondary | ICD-10-CM | POA: Insufficient documentation

## 2011-08-15 DIAGNOSIS — C9000 Multiple myeloma not having achieved remission: Secondary | ICD-10-CM | POA: Insufficient documentation

## 2011-08-15 DIAGNOSIS — L89899 Pressure ulcer of other site, unspecified stage: Secondary | ICD-10-CM | POA: Insufficient documentation

## 2011-08-17 ENCOUNTER — Other Ambulatory Visit: Payer: Self-pay | Admitting: Oncology

## 2011-08-17 ENCOUNTER — Other Ambulatory Visit: Payer: Self-pay | Admitting: *Deleted

## 2011-08-17 NOTE — Telephone Encounter (Signed)
Received call from pt's primary care physicians office wanting to r/s chemo. Per office pt missed chemo due to being in the hospital. appts missed were for lb/fu/chemo on 1/8. Office informed we would need to send message to PR re when he could see the patient and get him back on schedule for chemo. Office aware PD put of office until next week and we will call pt w/appt. Message to desk nurse to see if chemo can be r/s 'd w/o f/u or if we need to wait for PR to return.

## 2011-08-17 NOTE — Telephone Encounter (Signed)
THIS REQUEST WAS GIVEN TO DR.RUBIN'S NURSE, TIFFANY BRUCE,RN.

## 2011-08-17 NOTE — Telephone Encounter (Signed)
Called [facility]  [phone] in regards to refill request rec'd for pt's Revlimid 15 mg.  Per Debbora Presto, PA, this is on hold right now, as pt has had recent surgery, healing wounds, and office will call back when ready to refill.  She verbalizes understanding.  Note to MD's desk for review.

## 2011-08-22 ENCOUNTER — Telehealth: Payer: Self-pay | Admitting: Oncology

## 2011-08-22 NOTE — Telephone Encounter (Signed)
lmonvm for pt re appt for 2/5 @ 10 am. appts r/s per primary care office and nurse victoria. D/t per victoria. Scheduled mailed.

## 2011-08-22 NOTE — Progress Notes (Signed)
[facility]  NAME:  [name], [name]                    ACCOUNT NO.:  MEDICAL RECORD NO.:  [id number]      DATE OF BIRTH:  Feb 15, 1956  PHYSICIAN:  Joanne Gavel, M.D.         VISIT DATE:  08/22/2011                                  OFFICE VISIT   CHIEF COMPLAINT:  New wounds on left leg.  HISTORY OF PRESENT ILLNESS:  This is a 56 year old male, paraplegic with no sensations to lower extremities.  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  Over the course of the past week, he has developed a new blood blister of the left heel.  This has burst. There is also the beginning of a superficial wound in the posterior left leg.  The ulcer on the right ankle has not changed.  In spite of the fact that he has peripheral pulses, I have decided to order vascular studies.  Plan is to treat the impending ulcer on the posterior leg with Santyl and put a foam cup on the heel and allow the blister to act as a natural dressing.  The extent of that wound is hard to judge at present.  We will continue to treat the right ankle with collagen.     Joanne Gavel, M.D.     RA/MEDQ  D:  08/22/2011  T:  08/22/2011  Job:  [id number]

## 2011-08-23 ENCOUNTER — Other Ambulatory Visit: Payer: Self-pay | Admitting: Physician Assistant

## 2011-08-24 ENCOUNTER — Other Ambulatory Visit: Payer: Self-pay | Admitting: Family Medicine

## 2011-08-28 ENCOUNTER — Other Ambulatory Visit: Payer: Self-pay | Admitting: *Deleted

## 2011-08-28 NOTE — Telephone Encounter (Signed)
VERBAL ORDER AND READ BACK TO CHRIS SCHERER,PA- DR. Donnie Coffin TO SEE PT. TOMORROW AND WILL DISCUSS RESTARTING REVLIMID. NOTIFIED PT.'S WIFE. SHE VOICES UNDERSTANDING.

## 2011-08-29 ENCOUNTER — Other Ambulatory Visit (HOSPITAL_BASED_OUTPATIENT_CLINIC_OR_DEPARTMENT_OTHER): Payer: Medicare (Managed Care) | Admitting: Lab

## 2011-08-29 ENCOUNTER — Ambulatory Visit: Payer: PRIVATE HEALTH INSURANCE | Admitting: Lab

## 2011-08-29 ENCOUNTER — Ambulatory Visit: Payer: PRIVATE HEALTH INSURANCE

## 2011-08-29 ENCOUNTER — Ambulatory Visit (HOSPITAL_BASED_OUTPATIENT_CLINIC_OR_DEPARTMENT_OTHER): Admitting: Oncology

## 2011-08-29 ENCOUNTER — Encounter (HOSPITAL_BASED_OUTPATIENT_CLINIC_OR_DEPARTMENT_OTHER): Attending: General Surgery

## 2011-08-29 ENCOUNTER — Telehealth: Payer: Self-pay | Admitting: Oncology

## 2011-08-29 DIAGNOSIS — I872 Venous insufficiency (chronic) (peripheral): Secondary | ICD-10-CM | POA: Insufficient documentation

## 2011-08-29 DIAGNOSIS — C9000 Multiple myeloma not having achieved remission: Secondary | ICD-10-CM | POA: Insufficient documentation

## 2011-08-29 DIAGNOSIS — Z7901 Long term (current) use of anticoagulants: Secondary | ICD-10-CM

## 2011-08-29 DIAGNOSIS — L89509 Pressure ulcer of unspecified ankle, unspecified stage: Secondary | ICD-10-CM | POA: Insufficient documentation

## 2011-08-29 DIAGNOSIS — I82409 Acute embolism and thrombosis of unspecified deep veins of unspecified lower extremity: Secondary | ICD-10-CM

## 2011-08-29 DIAGNOSIS — L8990 Pressure ulcer of unspecified site, unspecified stage: Secondary | ICD-10-CM | POA: Insufficient documentation

## 2011-08-29 DIAGNOSIS — L89899 Pressure ulcer of other site, unspecified stage: Secondary | ICD-10-CM | POA: Insufficient documentation

## 2011-08-29 DIAGNOSIS — G8220 Paraplegia, unspecified: Secondary | ICD-10-CM | POA: Insufficient documentation

## 2011-08-29 DIAGNOSIS — Z79899 Other long term (current) drug therapy: Secondary | ICD-10-CM | POA: Insufficient documentation

## 2011-08-29 DIAGNOSIS — L89609 Pressure ulcer of unspecified heel, unspecified stage: Secondary | ICD-10-CM | POA: Insufficient documentation

## 2011-08-29 DIAGNOSIS — Z86718 Personal history of other venous thrombosis and embolism: Secondary | ICD-10-CM | POA: Insufficient documentation

## 2011-08-29 LAB — CBC WITH DIFFERENTIAL/PLATELET
BASO%: 0.5 % (ref 0.0–2.0)
Basophils Absolute: 0.1 10*3/uL (ref 0.0–0.1)
EOS%: 1.6 % (ref 0.0–7.0)
Eosinophils Absolute: 0.2 10*3/uL (ref 0.0–0.5)
HCT: 30.7 % — ABNORMAL LOW (ref 38.4–49.9)
HGB: 10.5 g/dL — ABNORMAL LOW (ref 13.0–17.1)
LYMPH%: 7.4 % — ABNORMAL LOW (ref 14.0–49.0)
MCH: 28.2 pg (ref 27.2–33.4)
MCHC: 34.2 g/dL (ref 32.0–36.0)
MCV: 82.5 fL (ref 79.3–98.0)
MONO#: 0.7 10*3/uL (ref 0.1–0.9)
MONO%: 6 % (ref 0.0–14.0)
NEUT#: 9.2 10*3/uL — ABNORMAL HIGH (ref 1.5–6.5)
NEUT%: 84.5 % — ABNORMAL HIGH (ref 39.0–75.0)
Platelets: 357 10*3/uL (ref 140–400)
RBC: 3.72 10*6/uL — ABNORMAL LOW (ref 4.20–5.82)
RDW: 18.1 % — ABNORMAL HIGH (ref 11.0–14.6)
WBC: 10.9 10*3/uL — ABNORMAL HIGH (ref 4.0–10.3)
lymph#: 0.8 10*3/uL — ABNORMAL LOW (ref 0.9–3.3)
nRBC: 0 % (ref 0–0)

## 2011-08-29 LAB — TECHNOLOGIST REVIEW

## 2011-08-29 NOTE — Telephone Encounter (Signed)
gve the pt his feb 2013

## 2011-08-31 LAB — COMPREHENSIVE METABOLIC PANEL
ALT: 44 U/L (ref 0–53)
AST: 24 U/L (ref 0–37)
Albumin: 3.6 g/dL (ref 3.5–5.2)
Alkaline Phosphatase: 100 U/L (ref 39–117)
BUN: 10 mg/dL (ref 6–23)
CO2: 25 mEq/L (ref 19–32)
Calcium: 8.4 mg/dL (ref 8.4–10.5)
Chloride: 108 mEq/L (ref 96–112)
Creatinine, Ser: 0.67 mg/dL (ref 0.50–1.35)
Glucose, Bld: 102 mg/dL — ABNORMAL HIGH (ref 70–99)
Potassium: 3.5 mEq/L (ref 3.5–5.3)
Sodium: 145 mEq/L (ref 135–145)
Total Bilirubin: 0.3 mg/dL (ref 0.3–1.2)
Total Protein: 5.8 g/dL — ABNORMAL LOW (ref 6.0–8.3)

## 2011-08-31 LAB — IMMUNOFIXATION ELECTROPHORESIS
IgA: 142 mg/dL (ref 68–379)
IgG (Immunoglobin G), Serum: 816 mg/dL (ref 650–1600)
IgM, Serum: 50 mg/dL (ref 41–251)
Total Protein, Serum Electrophoresis: 5.8 g/dL — ABNORMAL LOW (ref 6.0–8.3)

## 2011-08-31 LAB — KAPPA/LAMBDA LIGHT CHAINS
Kappa free light chain: 0.7 mg/dL (ref 0.33–1.94)
Kappa:Lambda Ratio: 1.06 (ref 0.26–1.65)
Lambda Free Lght Chn: 0.66 mg/dL (ref 0.57–2.63)

## 2011-08-31 LAB — BETA 2 MICROGLOBULIN, SERUM: Beta-2 Microglobulin: 1.78 mg/L — ABNORMAL HIGH (ref 1.01–1.73)

## 2011-09-05 ENCOUNTER — Ambulatory Visit (HOSPITAL_BASED_OUTPATIENT_CLINIC_OR_DEPARTMENT_OTHER): Payer: Medicare (Managed Care) | Admitting: Physician Assistant

## 2011-09-05 ENCOUNTER — Other Ambulatory Visit: Payer: Medicaid Other | Admitting: Lab

## 2011-09-05 DIAGNOSIS — C9000 Multiple myeloma not having achieved remission: Secondary | ICD-10-CM

## 2011-09-05 NOTE — Patient Instructions (Signed)
1.Dr. Donnie Coffin has decided to discontinue Revlimid and Decadron at this time.  He feels that the treatment may hinder wound healing, and that puts you at risk for infection.    You are schedule to see Dr. Donnie Coffin on 09/26/11 at 1100.  He will make a decision at that time pertaining to resuming Zometa.  2.[facility] will continue to moniter your PT/INR (Coumadin) levels.

## 2011-09-05 NOTE — Progress Notes (Signed)
Hematology and Oncology Follow Up Visit  [name] [id number] 1956/06/14 56 y.o. 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 He underwent surgical decompression by Dr. Venetia Maxon. Radiation was completed on 08/22/2010.  2. Bilateral lower extremity DVTs despite prophylactic dosing of Coumadin in a therapeutic range due to Revlimid therapy. He underwent IVC filter placement and is on Coumadin, being monitored through [facility].  3. Approximately 2 months out from exploratory laparotomy and sigmoid colectomy with end colostomy secondary to perforated sigmoid colon.   Interim History:   [name] denies any specific complaints, specifically no fevers chills shortness of breath or chest pain. His ostomy is functioning well. He is feeling more comfortable with managing it. He continues to be followed closely by Dr. Dorothe Pea. He denies any bleeding or bruising symptoms. He denies any neuropathy symptoms of his upper extremities.  A detailed review of systems is otherwise noncontributory as noted below.  Review of Systems: Constitutional:  no weight loss, fever, night sweats and feels well Eyes: No complaints ENT: No complaints Cardiovascular: no chest pain or dyspnea on exertion Respiratory: no cough, shortness of breath, or wheezing Neurological: negative Dermatological: Known bilateral skin ulcers in posterior foot region. Gastrointestinal: Known ostomy Genito-Urinary: Known foley Hematological and Lymphatic: negative Breast: negative Musculoskeletal: negative Remaining ROS negative.   Medications:   I have reviewed the  patient's current medications.  Current Outpatient Prescriptions  Medication Sig Dispense Refill  . acetaminophen (TYLENOL) 325 MG tablet Take 325-650 mg by mouth every 6 (six) hours as needed. For pain.       Marland Kitchen dexamethasone (DECADRON) 2 MG tablet Take 2-4 mg by mouth 2 (two) times daily with a meal. Alternates on days 5-28 of each month between 2 MG daily and 4 MG daily.       Marland Kitchen dexamethasone (DECADRON) 4 MG tablet Take 10 mg by mouth daily. On the 1-4 of each month.       . lenalidomide (REVLIMID) 15 MG capsule Take 15 mg by mouth daily.        Marland Kitchen LORazepam (ATIVAN) 0.5 MG tablet Take 0.25-0.5 mg by mouth every 6 (six) hours as needed. For anxiety.       . methocarbamol (ROBAXIN) 500 MG tablet Take 500 mg by mouth every 6 (six) hours as needed. For muscle spasms.      Marland Kitchen oxyCODONE (OXY IR/ROXICODONE) 5 MG immediate release tablet Take 1 tablet (5 mg total) by mouth every 4 (four) hours as needed for pain. For pain.  30 tablet  0  . pantoprazole (PROTONIX) 40 MG tablet Take 40 mg by mouth daily.       Marland Kitchen sulfamethoxazole-trimethoprim (BACTRIM DS) 800-160 MG per tablet Take 1 tablet by mouth daily. Takes on Mondays, Wednesdays, and Fridays.       Marland Kitchen warfarin (COUMADIN) 5 MG tablet Take 5 mg by mouth daily. 7.5mg  Monday & Thursday & 5mg  all other days        Allergies: No Known Allergies  Physical Exam: Filed Vitals:   09/05/11 1429  BP: 121/84  Pulse: 91  Temp: 99 F (37.2 C)   HEENT:  Sclerae anicteric, conjunctivae pink.  Oropharynx clear.  No mucositis or candidiasis.     Lungs:  Clear to auscultation bilaterally.  No crackles, rhonchi, or wheezes.   Heart:  Regular rate and rhythm.     Musculoskeletal:  Patient is wheelchair-bound due to paraplegia. Lower extremities of sheepskin lined supports in place. Neuro:  Unchanged, alert and oriented x 3.   Lab Results: Lab Results  Component Value Date   WBC 10.9* 08/29/2011   HGB 10.5* 08/29/2011   HCT 30.7* 08/29/2011   MCV 82.5 08/29/2011     PLT 357 08/29/2011   NEUTROABS 9.2* 08/29/2011     Chemistry      Component Value Date/Time   NA 139 09/05/2011 1327   K 3.8 09/05/2011 1327   CL 103 09/05/2011 1327   CO2 26 09/05/2011 1327   BUN 8 09/05/2011 1327   CREATININE 0.81 09/05/2011 1327   GLU 166* 09/19/2010 1538      Component Value Date/Time   CALCIUM 8.8 09/05/2011 1327   ALKPHOS 100 08/29/2011 0957   AST 24 08/29/2011 0957   ALT 44 08/29/2011 0957   BILITOT 0.3 08/29/2011 0957        Radiological Studies: Dg Chest 2 View 08/08/2011  *RADIOLOGY REPORT*  Clinical Data: Cough for several weeks  CHEST - 2 VIEW  Comparison: Chest x-ray of 07/27/2001  Findings: The lungs remain clear.  Mild cardiomegaly is stable. Mid thoracic corpectomy appears stable.  IMPRESSION:  No active lung disease.  Stable cardiomegaly.  Original Report Authenticated By: Juline Patch, M.D.     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 He underwent surgical decompression by Dr. Venetia Maxon. Radiation was completed on 08/22/2010.  2. Bilateral lower extremity DVTs despite prophylactic dosing of Coumadin in a therapeutic range due to Revlimid therapy. He underwent IVC filter placement and is on Coumadin, being monitored through [facility].  3. Approximately 2 months out from exploratory laparotomy and sigmoid colectomy with end colostomy secondary to perforated sigmoid colon.  Case reviewed with Dr. Pierce Crane.   Plan:  At this point, we will not recommend resuming Revlimid or pulse Decadron therapy on [name]. Instead, we'll monitor him on observation alone. He is her scheduled followup with Dr. Donnie Coffin on  09/26/2011, at that time it can be determined whether we will resume Zometa. He will continue to be monitored through [facility]. Of note, Dr. Donnie Coffin has been in contact with patient's primary care physician Dr. Dorothe Pea in this regard.   This plan was reviewed with the patient, who voices understanding and agreement.  She knows to call with any changes or problems.    Shelby Mattocks 09/05/2011'

## 2011-09-07 LAB — SPEP & IFE WITH QIG
Albumin ELP: 44.6 % — ABNORMAL LOW (ref 55.8–66.1)
Alpha-1-Globulin: 11.1 % — ABNORMAL HIGH (ref 2.9–4.9)
Alpha-2-Globulin: 18.3 % — ABNORMAL HIGH (ref 7.1–11.8)
Beta 2: 7 % — ABNORMAL HIGH (ref 3.2–6.5)
Beta Globulin: 6.1 % (ref 4.7–7.2)
Gamma Globulin: 12.9 % (ref 11.1–18.8)
IgA: 181 mg/dL (ref 68–379)
IgG (Immunoglobin G), Serum: 844 mg/dL (ref 650–1600)
IgM, Serum: 48 mg/dL (ref 41–251)
M-Spike, %: 0.28 g/dL
Total Protein, Serum Electrophoresis: 5.8 g/dL — ABNORMAL LOW (ref 6.0–8.3)

## 2011-09-07 LAB — BASIC METABOLIC PANEL
BUN: 8 mg/dL (ref 6–23)
CO2: 26 mEq/L (ref 19–32)
Calcium: 8.8 mg/dL (ref 8.4–10.5)
Chloride: 103 mEq/L (ref 96–112)
Creatinine, Ser: 0.81 mg/dL (ref 0.50–1.35)
Glucose, Bld: 99 mg/dL (ref 70–99)
Potassium: 3.8 mEq/L (ref 3.5–5.3)
Sodium: 139 mEq/L (ref 135–145)

## 2011-09-07 LAB — KAPPA/LAMBDA LIGHT CHAINS
Kappa free light chain: 1 mg/dL (ref 0.33–1.94)
Kappa:Lambda Ratio: 0.79 (ref 0.26–1.65)
Lambda Free Lght Chn: 1.26 mg/dL (ref 0.57–2.63)

## 2011-09-07 LAB — BETA 2 MICROGLOBULIN, SERUM: Beta-2 Microglobulin: 1.89 mg/L — ABNORMAL HIGH (ref 1.01–1.73)

## 2011-09-19 ENCOUNTER — Encounter (HOSPITAL_BASED_OUTPATIENT_CLINIC_OR_DEPARTMENT_OTHER): Payer: PRIVATE HEALTH INSURANCE

## 2011-09-26 ENCOUNTER — Ambulatory Visit: Payer: Medicare (Managed Care)

## 2011-09-26 ENCOUNTER — Encounter (HOSPITAL_BASED_OUTPATIENT_CLINIC_OR_DEPARTMENT_OTHER): Payer: Medicare (Managed Care) | Attending: General Surgery

## 2011-09-26 ENCOUNTER — Telehealth: Payer: Self-pay | Admitting: Oncology

## 2011-09-26 ENCOUNTER — Ambulatory Visit (HOSPITAL_BASED_OUTPATIENT_CLINIC_OR_DEPARTMENT_OTHER): Payer: Medicare (Managed Care) | Admitting: Oncology

## 2011-09-26 ENCOUNTER — Other Ambulatory Visit: Payer: Medicare (Managed Care) | Admitting: Lab

## 2011-09-26 DIAGNOSIS — G8220 Paraplegia, unspecified: Secondary | ICD-10-CM | POA: Insufficient documentation

## 2011-09-26 DIAGNOSIS — L8990 Pressure ulcer of unspecified site, unspecified stage: Secondary | ICD-10-CM | POA: Insufficient documentation

## 2011-09-26 DIAGNOSIS — I82409 Acute embolism and thrombosis of unspecified deep veins of unspecified lower extremity: Secondary | ICD-10-CM

## 2011-09-26 DIAGNOSIS — C9000 Multiple myeloma not having achieved remission: Secondary | ICD-10-CM

## 2011-09-26 DIAGNOSIS — L89899 Pressure ulcer of other site, unspecified stage: Secondary | ICD-10-CM | POA: Insufficient documentation

## 2011-09-26 DIAGNOSIS — Z79899 Other long term (current) drug therapy: Secondary | ICD-10-CM | POA: Insufficient documentation

## 2011-09-26 DIAGNOSIS — Z7901 Long term (current) use of anticoagulants: Secondary | ICD-10-CM | POA: Insufficient documentation

## 2011-09-26 DIAGNOSIS — Z86718 Personal history of other venous thrombosis and embolism: Secondary | ICD-10-CM | POA: Insufficient documentation

## 2011-09-26 LAB — BASIC METABOLIC PANEL
BUN: 17 mg/dL (ref 6–23)
CO2: 22 mEq/L (ref 19–32)
Calcium: 9.2 mg/dL (ref 8.4–10.5)
Chloride: 106 mEq/L (ref 96–112)
Creatinine, Ser: 0.7 mg/dL (ref 0.50–1.35)
Glucose, Bld: 131 mg/dL — ABNORMAL HIGH (ref 70–99)
Potassium: 3.6 mEq/L (ref 3.5–5.3)
Sodium: 141 mEq/L (ref 135–145)

## 2011-09-26 NOTE — Telephone Encounter (Signed)
gve the pt his April 2013 appt calendar 

## 2011-09-26 NOTE — Progress Notes (Signed)
Hematology and Oncology Follow Up Visit  [name] [id number] 06-09-1956 56 y.o. 09/26/2011    HPI: This is a 56 year old [name] gentleman with IgG multiple myeloma diagnosed after presenting with progressive back pain in December 2011 with subsequent hospitalization including MRI of the T-spine which revealed 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 He underwent surgical decompression by Dr. Venetia Maxon. Radiation was completed on 08/22/2010.  2. Bilateral lower extremity DVTs despite prophylactic dosing of Coumadin in a therapeutic range due to Revlimid therapy. He underwent IVC filter placement and is on Coumadin, being monitored through [facility].  3. Approximately 2 months out from exploratory laparotomy and sigmoid colectomy with end colostomy secondary to perforated sigmoid colon.   Interim History:   Mr. [name] denies any specific complaints, specifically no fevers chills shortness of breath or chest pain. His ostomy is functioning well. He is feeling more comfortable with managing it. He continues to be followed closely by Dr. Dorothe Pea. He denies any bleeding or bruising symptoms. He denies any neuropathy symptoms of his upper extremities. He undertaking of Velcade or Revlimid or high-dose steroids. He feels much better. He has recent cataract surgery in both eyes. [facility] areas of lower extremities and need further work including hyperbaric therapy.  A detailed review of systems is otherwise noncontributory as noted below.  Review of Systems: Constitutional:  no weight loss, fever, night sweats and feels well Eyes: No complaints ENT: No complaints Cardiovascular: no chest pain or dyspnea on exertion Respiratory: no cough, shortness of breath, or wheezing Neurological: negative Dermatological: Known bilateral skin  ulcers in posterior foot region. Gastrointestinal: Known ostomy Genito-Urinary: Known foley Hematological and Lymphatic: negative Breast: negative Musculoskeletal: negative Remaining ROS negative.   Medications:   I have reviewed the patient's current medications.  Current Outpatient Prescriptions  Medication Sig Dispense Refill  . acetaminophen (TYLENOL) 325 MG tablet Take 325-650 mg by mouth every 6 (six) hours as needed. For pain.       Marland Kitchen dexamethasone (DECADRON) 2 MG tablet Take 2-4 mg by mouth 2 (two) times daily with a meal. Alternates on days 5-28 of each month between 2 MG daily and 4 MG daily.       Marland Kitchen dexamethasone (DECADRON) 4 MG tablet Take 10 mg by mouth daily. On the 1-4 of each month.       . lenalidomide (REVLIMID) 15 MG capsule Take 15 mg by mouth daily.        Marland Kitchen LORazepam (ATIVAN) 0.5 MG tablet Take 0.25-0.5 mg by mouth every 6 (six) hours as needed. For anxiety.       . methocarbamol (ROBAXIN) 500 MG tablet Take 500 mg by mouth every 6 (six) hours as needed. For muscle spasms.      Marland Kitchen oxyCODONE (OXY IR/ROXICODONE) 5 MG immediate release tablet Take 1 tablet (5 mg total) by mouth every 4 (four) hours as needed for pain. For pain.  30 tablet  0  . pantoprazole (PROTONIX) 40 MG tablet Take 40 mg by mouth daily.       Marland Kitchen sulfamethoxazole-trimethoprim (BACTRIM DS) 800-160 MG per tablet Take 1 tablet by mouth daily. Takes on Mondays, Wednesdays, and Fridays.       Marland Kitchen warfarin (COUMADIN) 5 MG tablet Take 5 mg by mouth daily. 7.5mg  Monday & Thursday & 5mg  all other days  Allergies: No Known Allergies  Physical Exam: Filed Vitals:   09/26/11 1129  BP: 134/89  Pulse: 106  Temp: 98.4 F (36.9 C)   HEENT:  Sclerae anicteric, conjunctivae pink.  Oropharynx clear.  No mucositis or candidiasis.     Lungs:  Clear to auscultation bilaterally.  No crackles, rhonchi, or wheezes.   Heart:  Regular rate and rhythm.     Musculoskeletal:  Patient is wheelchair-bound due to  paraplegia. Lower extremities of sheepskin lined supports in place. Neuro:  Unchanged, alert and oriented x 3.   Lab Results: Lab Results  Component Value Date   WBC 10.9* 08/29/2011   HGB 10.5* 08/29/2011   HCT 30.7* 08/29/2011   MCV 82.5 08/29/2011   PLT 357 08/29/2011   NEUTROABS 9.2* 08/29/2011     Chemistry      Component Value Date/Time   NA 139 09/05/2011 1327   K 3.8 09/05/2011 1327   CL 103 09/05/2011 1327   CO2 26 09/05/2011 1327   BUN 8 09/05/2011 1327   CREATININE 0.81 09/05/2011 1327   GLU 166* 09/19/2010 1538      Component Value Date/Time   CALCIUM 8.8 09/05/2011 1327   ALKPHOS 100 08/29/2011 0957   AST 24 08/29/2011 0957   ALT 44 08/29/2011 0957   BILITOT 0.3 08/29/2011 0957        Radiological Studies: Dg Chest 2 View 08/08/2011  *RADIOLOGY REPORT*  Clinical Data: Cough for several weeks  CHEST - 2 VIEW  Comparison: Chest x-ray of 07/27/2001  Findings: The lungs remain clear.  Mild cardiomegaly is stable. Mid thoracic corpectomy appears stable.  IMPRESSION:  No active lung disease.  Stable cardiomegaly.  Original Report Authenticated By: Juline Patch, M.D.     Assessment:  This is a 56 year old [name] gentleman with IgG multiple myeloma diagnosed after presenting with progressive back pain in December 2011 with subsequent hospitalization including MRI of the T-spine which revealed 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 He underwent surgical decompression by Dr. Venetia Maxon. Radiation was completed on 08/22/2010.  2. Bilateral lower extremity DVTs despite prophylactic dosing of Coumadin in a therapeutic range due to Revlimid therapy. He underwent IVC filter placement and is on Coumadin, being monitored through [facility].  3. Approximately 2 months out from exploratory laparotomy and sigmoid colectomy with end colostomy secondary to perforated sigmoid  colon.    Plan:  At this point, we will not recommend resuming Revlimid or pulse Decadron therapy on Mr. [name]. Instead, we'll monitor him on observation alone. His protein levels have been stable and his lambda light chains have not Inc. he will resume Zometa. He will continue to be monitored through [facility]. We will continue to see him a monthly basis and check his protein levels. I've indicated that S. as soon as we see some increases we will likely begin restarting therapy.   This plan was reviewed with the patient, who voices understanding and agreement.  She knows to call with any changes or problems.    Pierce Crane, MD 09/26/2011'

## 2011-10-24 ENCOUNTER — Ambulatory Visit (HOSPITAL_BASED_OUTPATIENT_CLINIC_OR_DEPARTMENT_OTHER): Payer: Medicare (Managed Care) | Admitting: Physician Assistant

## 2011-10-24 ENCOUNTER — Ambulatory Visit (HOSPITAL_BASED_OUTPATIENT_CLINIC_OR_DEPARTMENT_OTHER): Payer: Medicare (Managed Care)

## 2011-10-24 ENCOUNTER — Encounter: Payer: Self-pay | Admitting: Physician Assistant

## 2011-10-24 ENCOUNTER — Other Ambulatory Visit: Payer: Self-pay | Admitting: Physician Assistant

## 2011-10-24 ENCOUNTER — Other Ambulatory Visit (HOSPITAL_BASED_OUTPATIENT_CLINIC_OR_DEPARTMENT_OTHER): Payer: Medicare (Managed Care) | Admitting: Lab

## 2011-10-24 ENCOUNTER — Encounter (HOSPITAL_BASED_OUTPATIENT_CLINIC_OR_DEPARTMENT_OTHER): Payer: Medicare (Managed Care) | Attending: General Surgery

## 2011-10-24 DIAGNOSIS — G8220 Paraplegia, unspecified: Secondary | ICD-10-CM | POA: Insufficient documentation

## 2011-10-24 DIAGNOSIS — I82409 Acute embolism and thrombosis of unspecified deep veins of unspecified lower extremity: Secondary | ICD-10-CM

## 2011-10-24 DIAGNOSIS — C9000 Multiple myeloma not having achieved remission: Secondary | ICD-10-CM

## 2011-10-24 DIAGNOSIS — L89899 Pressure ulcer of other site, unspecified stage: Secondary | ICD-10-CM | POA: Insufficient documentation

## 2011-10-24 DIAGNOSIS — Z7901 Long term (current) use of anticoagulants: Secondary | ICD-10-CM | POA: Insufficient documentation

## 2011-10-24 DIAGNOSIS — L8990 Pressure ulcer of unspecified site, unspecified stage: Secondary | ICD-10-CM | POA: Insufficient documentation

## 2011-10-24 DIAGNOSIS — Z79899 Other long term (current) drug therapy: Secondary | ICD-10-CM | POA: Insufficient documentation

## 2011-10-24 DIAGNOSIS — Z86718 Personal history of other venous thrombosis and embolism: Secondary | ICD-10-CM | POA: Insufficient documentation

## 2011-10-24 LAB — CBC WITH DIFFERENTIAL/PLATELET
BASO%: 0.9 % (ref 0.0–2.0)
Basophils Absolute: 0.1 10*3/uL (ref 0.0–0.1)
EOS%: 1.6 % (ref 0.0–7.0)
Eosinophils Absolute: 0.2 10*3/uL (ref 0.0–0.5)
HCT: 34.5 % — ABNORMAL LOW (ref 38.4–49.9)
HGB: 11.5 g/dL — ABNORMAL LOW (ref 13.0–17.1)
LYMPH%: 12.7 % — ABNORMAL LOW (ref 14.0–49.0)
MCH: 27.1 pg — ABNORMAL LOW (ref 27.2–33.4)
MCHC: 33.4 g/dL (ref 32.0–36.0)
MCV: 80.9 fL (ref 79.3–98.0)
MONO#: 0.4 10*3/uL (ref 0.1–0.9)
MONO%: 2.6 % (ref 0.0–14.0)
NEUT#: 11.6 10*3/uL — ABNORMAL HIGH (ref 1.5–6.5)
NEUT%: 82.2 % — ABNORMAL HIGH (ref 39.0–75.0)
Platelets: 344 10*3/uL (ref 140–400)
RBC: 4.27 10*6/uL (ref 4.20–5.82)
RDW: 19.9 % — ABNORMAL HIGH (ref 11.0–14.6)
WBC: 14.1 10*3/uL — ABNORMAL HIGH (ref 4.0–10.3)
lymph#: 1.8 10*3/uL (ref 0.9–3.3)
nRBC: 0 % (ref 0–0)

## 2011-10-24 MED ORDER — SODIUM CHLORIDE 0.9 % IV SOLN
Freq: Once | INTRAVENOUS | Status: AC
  Administered 2011-10-24: 10:00:00 via INTRAVENOUS

## 2011-10-24 MED ORDER — ZOLEDRONIC ACID 4 MG/100ML IV SOLN
4.0000 mg | Freq: Once | INTRAVENOUS | Status: AC
  Administered 2011-10-24: 4 mg via INTRAVENOUS
  Filled 2011-10-24: qty 100

## 2011-10-24 NOTE — Progress Notes (Signed)
Addended by: Amado Coe on: 10/24/2011 10:08 AM   Modules accepted: Orders

## 2011-10-24 NOTE — Patient Instructions (Signed)
[facility] Discharge Instructions for Patients Receiving Chemotherapy  Today you received the following chemotherapy agents Zometa To help prevent nausea and vomiting after your treatment, we encourage you to take your nausea medication as prescribed. If you develop nausea and vomiting that is not controlled by your nausea medication, call the clinic. If it is after clinic hours your family physician or the after hours number for the clinic or go to the Emergency Department.   BELOW ARE SYMPTOMS THAT SHOULD BE REPORTED IMMEDIATELY:  *FEVER GREATER THAN 100.5 F  *CHILLS WITH OR WITHOUT FEVER  NAUSEA AND VOMITING THAT IS NOT CONTROLLED WITH YOUR NAUSEA MEDICATION  *UNUSUAL SHORTNESS OF BREATH  *UNUSUAL BRUISING OR BLEEDING  TENDERNESS IN MOUTH AND THROAT WITH OR WITHOUT PRESENCE OF ULCERS  *URINARY PROBLEMS  *BOWEL PROBLEMS  UNUSUAL RASH Items with * indicate a potential emergency and should be followed up as soon as possible.  One of the nurses will contact you 24 hours after your treatment. Please let the nurse know about any problems that you may have experienced. Feel free to call the clinic you have any questions or concerns. The clinic phone number is [phone].   I have been informed and understand all the instructions given to me. I know to contact the clinic, my physician, or go to the Emergency Department if any problems should occur. I do not have any questions at this time, but understand that I may call the clinic during office hours   should I have any questions or need assistance in obtaining follow up care.    __________________________________________  _____________  __________ Signature of Patient or Authorized Representative            Date                   Time    __________________________________________ Nurse's Signature    

## 2011-10-24 NOTE — Progress Notes (Signed)
Hematology and Oncology Follow Up Visit  [name] [id number] 23-Mar-1956 56 y.o. 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 He underwent surgical decompression by Dr. Venetia Maxon. Radiation was completed on 08/22/2010.  Now on single agent monthly is Zometa after discontinuation of dexamethasone/Revlimid therapy. 2. 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 He underwent IVC filter placement and is on Coumadin, being monitored through [facility].  3. 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  Interim History:   [name] denies any specific complaints, specifically no fevers chills shortness of breath or chest pain. His ostomy is functioning well. He is feeling more comfortable with managing it. He continues to be followed closely by Dr. Dorothe Pea. He denies any bleeding or bruising symptoms. He denies any neuropathy symptoms of his upper extremities.  A detailed review of systems is otherwise noncontributory as noted below.  Review of Systems: Constitutional:  no weight loss, fever, night sweats and feels well Eyes: No complaints ENT: No complaints Cardiovascular: no chest pain or dyspnea on exertion Respiratory: no cough, shortness of breath, or wheezing Neurological: negative Dermatological: Known bilateral skin ulcers in posterior foot region. Gastrointestinal: Known ostomy Genito-Urinary: Known foley Hematological and Lymphatic: negative Breast: negative Musculoskeletal:  negative Remaining ROS negative.   Medications:   I have reviewed the patient's current medications.  Current Outpatient Prescriptions  Medication Sig Dispense Refill  . acetaminophen (TYLENOL) 325 MG tablet Take 325-650 mg by mouth every 6 (six) hours as needed. For pain.       Marland Kitchen LORazepam (ATIVAN) 0.5 MG tablet Take 0.25-0.5 mg by mouth every 6 (six) hours as needed. For anxiety.       . methocarbamol (ROBAXIN) 500 MG tablet Take 500 mg by mouth every 6 (six) hours as needed. For muscle spasms.      . pantoprazole (PROTONIX) 40 MG tablet Take 40 mg by mouth daily.       Marland Kitchen sulfamethoxazole-trimethoprim (BACTRIM DS) 800-160 MG per tablet Take 1 tablet by mouth daily. Takes on Mondays, Wednesdays, and Fridays.       Marland Kitchen warfarin (COUMADIN) 5 MG tablet Take 5 mg by mouth daily. 7.5mg  Monday & Thursday & 5mg  all other days      . oxyCODONE (OXY IR/ROXICODONE) 5 MG immediate release tablet Take 1 tablet (5 mg total) by mouth every 4 (four) hours as needed for pain. For pain.  30 tablet  0    Allergies: No Known Allergies  Physical Exam: Filed Vitals:   10/24/11 0911  BP: 118/87  Pulse: 103  Temp: 98 F (36.7 C)   HEENT:  Sclerae anicteric, conjunctivae pink.  Oropharynx clear.  No mucositis or candidiasis.     Lungs:  Clear to auscultation bilaterally.  No crackles, rhonchi, or wheezes.   Heart:  Regular rate and rhythm.     Musculoskeletal:  Patient is wheelchair-bound due to paraplegia. Lower extremities of sheepskin lined supports in place. Neuro:  Unchanged, alert and oriented x 3.   Lab Results: Lab Results  Component Value Date   WBC 14.1* 10/24/2011   HGB  11.5* 10/24/2011   HCT 34.5* 10/24/2011   MCV 80.9 10/24/2011   PLT 344 10/24/2011   NEUTROABS 11.6* 10/24/2011     Chemistry      Component Value Date/Time   NA 141 09/26/2011 1110   K 3.6 09/26/2011 1110   CL 106 09/26/2011 1110   CO2 22 09/26/2011 1110   BUN 17 09/26/2011 1110   CREATININE 0.70 09/26/2011 1110   GLU 166* 09/19/2010  1538      Component Value Date/Time   CALCIUM 9.2 09/26/2011 1110   ALKPHOS 100 08/29/2011 0957   AST 24 08/29/2011 0957   ALT 44 08/29/2011 0957   BILITOT 0.3 08/29/2011 0957        Radiological Studies: Dg Chest 2 View 08/08/2011  *RADIOLOGY REPORT*  Clinical Data: Cough for several weeks  CHEST - 2 VIEW  Comparison: Chest x-ray of 07/27/2001  Findings: The lungs remain clear.  Mild cardiomegaly is stable. Mid thoracic corpectomy appears stable.  IMPRESSION:  No active lung disease.  Stable cardiomegaly.  Original Report Authenticated By: Juline Patch, M.D.     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 He underwent surgical decompression by Dr. Venetia Maxon. Radiation was completed on 08/22/2010.  Now on single agent monthly is Zometa after discontinuation of dexamethasone/Revlimid therapy. 2. 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 He underwent IVC filter placement and is on Coumadin, being monitored through [facility].  3. 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  Case reviewed with Dr. Pierce Crane.   Plan:  [name] will receive Zometa today as scheduled.  We will continue to monitor him monthly with appropriate protein studies, and followup exam prior to Zometa dosing. He will continue to be monitored through [facility], in regards to his PT/INR. He'll continue wound care as outlined by [facility].  This plan was reviewed with the patient, who voices understanding and agreement.   She knows to call with any changes or problems.    Amada Kingfisher, PA-C 10/24/2011'

## 2011-10-25 ENCOUNTER — Telehealth: Payer: Self-pay | Admitting: Oncology

## 2011-10-25 NOTE — Telephone Encounter (Signed)
S/w the pt's wife and she is aware of the pt's appts on 11/21/2011 and to pick up an appt calendar when they come in

## 2011-10-26 LAB — COMPREHENSIVE METABOLIC PANEL
ALT: 59 U/L — ABNORMAL HIGH (ref 0–53)
AST: 29 U/L (ref 0–37)
Albumin: 3.5 g/dL (ref 3.5–5.2)
Alkaline Phosphatase: 97 U/L (ref 39–117)
BUN: 12 mg/dL (ref 6–23)
CO2: 25 mEq/L (ref 19–32)
Calcium: 8.9 mg/dL (ref 8.4–10.5)
Chloride: 107 mEq/L (ref 96–112)
Creatinine, Ser: 0.77 mg/dL (ref 0.50–1.35)
Glucose, Bld: 133 mg/dL — ABNORMAL HIGH (ref 70–99)
Potassium: 3.8 mEq/L (ref 3.5–5.3)
Sodium: 144 mEq/L (ref 135–145)
Total Bilirubin: 0.3 mg/dL (ref 0.3–1.2)
Total Protein: 6.5 g/dL (ref 6.0–8.3)

## 2011-10-26 LAB — KAPPA/LAMBDA LIGHT CHAINS
Kappa free light chain: 0.99 mg/dL (ref 0.33–1.94)
Kappa:Lambda Ratio: 1.39 (ref 0.26–1.65)
Lambda Free Lght Chn: 0.71 mg/dL (ref 0.57–2.63)

## 2011-10-26 LAB — IMMUNOFIXATION ELECTROPHORESIS
IgA: 141 mg/dL (ref 68–379)
IgG (Immunoglobin G), Serum: 1000 mg/dL (ref 650–1600)
IgM, Serum: 46 mg/dL (ref 41–251)
Total Protein, Serum Electrophoresis: 6.5 g/dL (ref 6.0–8.3)

## 2011-11-21 ENCOUNTER — Ambulatory Visit (HOSPITAL_BASED_OUTPATIENT_CLINIC_OR_DEPARTMENT_OTHER): Payer: Medicare (Managed Care) | Admitting: Physician Assistant

## 2011-11-21 ENCOUNTER — Ambulatory Visit (HOSPITAL_BASED_OUTPATIENT_CLINIC_OR_DEPARTMENT_OTHER): Payer: Medicare (Managed Care)

## 2011-11-21 ENCOUNTER — Other Ambulatory Visit (HOSPITAL_BASED_OUTPATIENT_CLINIC_OR_DEPARTMENT_OTHER): Payer: Medicare (Managed Care) | Admitting: Lab

## 2011-11-21 ENCOUNTER — Encounter: Payer: Self-pay | Admitting: Physician Assistant

## 2011-11-21 DIAGNOSIS — I82409 Acute embolism and thrombosis of unspecified deep veins of unspecified lower extremity: Secondary | ICD-10-CM

## 2011-11-21 DIAGNOSIS — C9000 Multiple myeloma not having achieved remission: Secondary | ICD-10-CM

## 2011-11-21 LAB — CBC WITH DIFFERENTIAL/PLATELET
BASO%: 0.2 % (ref 0.0–2.0)
Basophils Absolute: 0 10*3/uL (ref 0.0–0.1)
EOS%: 1.8 % (ref 0.0–7.0)
Eosinophils Absolute: 0.3 10*3/uL (ref 0.0–0.5)
HCT: 33.9 % — ABNORMAL LOW (ref 38.4–49.9)
HGB: 11 g/dL — ABNORMAL LOW (ref 13.0–17.1)
LYMPH%: 8.2 % — ABNORMAL LOW (ref 14.0–49.0)
MCH: 25.5 pg — ABNORMAL LOW (ref 27.2–33.4)
MCHC: 32.4 g/dL (ref 32.0–36.0)
MCV: 78.9 fL — ABNORMAL LOW (ref 79.3–98.0)
MONO#: 0.5 10*3/uL (ref 0.1–0.9)
MONO%: 3.1 % (ref 0.0–14.0)
NEUT#: 14 10*3/uL — ABNORMAL HIGH (ref 1.5–6.5)
NEUT%: 86.7 % — ABNORMAL HIGH (ref 39.0–75.0)
Platelets: 399 10*3/uL (ref 140–400)
RBC: 4.3 10*6/uL (ref 4.20–5.82)
RDW: 20.4 % — ABNORMAL HIGH (ref 11.0–14.6)
WBC: 16.1 10*3/uL — ABNORMAL HIGH (ref 4.0–10.3)
lymph#: 1.3 10*3/uL (ref 0.9–3.3)
nRBC: 0 % (ref 0–0)

## 2011-11-21 MED ORDER — ZOLEDRONIC ACID 4 MG/100ML IV SOLN
4.0000 mg | Freq: Once | INTRAVENOUS | Status: DC
  Filled 2011-11-21: qty 100

## 2011-11-21 MED ORDER — ZOLEDRONIC ACID 4 MG/100ML IV SOLN
4.0000 mg | Freq: Once | INTRAVENOUS | Status: AC
  Administered 2011-11-21: 4 mg via INTRAVENOUS
  Filled 2011-11-21: qty 100

## 2011-11-21 MED ORDER — SODIUM CHLORIDE 0.9 % IV SOLN
Freq: Once | INTRAVENOUS | Status: AC
  Administered 2011-11-21: 12:00:00 via INTRAVENOUS

## 2011-11-21 NOTE — Patient Instructions (Signed)
Zoledronic Acid injection (Hypercalcemia, Oncology) What is this medicine? ZOLEDRONIC ACID (ZOE le dron ik AS id) lowers the amount of calcium loss from bone. It is used to treat too much calcium in your blood from cancer. It is also used to prevent complications of cancer that has spread to the bone. This medicine may be used for other purposes; ask your health care provider or pharmacist if you have questions. What should I tell my health care provider before I take this medicine? They need to know if you have any of these conditions: -aspirin-sensitive asthma -dental disease -kidney disease -an unusual or allergic reaction to zoledronic acid, other medicines, foods, dyes, or preservatives -pregnant or trying to get pregnant -breast-feeding How should I use this medicine? This medicine is for infusion into a vein. It is given by a health care professional in a hospital or clinic setting. Talk to your pediatrician regarding the use of this medicine in children. Special care may be needed. Overdosage: If you think you have taken too much of this medicine contact a poison control center or emergency room at once. NOTE: This medicine is only for you. Do not share this medicine with others. What if I miss a dose? It is important not to miss your dose. Call your doctor or health care professional if you are unable to keep an appointment. What may interact with this medicine? -certain antibiotics given by injection -NSAIDs, medicines for pain and inflammation, like ibuprofen or naproxen -some diuretics like bumetanide, furosemide -teriparatide -thalidomide This list may not describe all possible interactions. Give your health care provider a list of all the medicines, herbs, non-prescription drugs, or dietary supplements you use. Also tell them if you smoke, drink alcohol, or use illegal drugs. Some items may interact with your medicine. What should I watch for while using this medicine? Visit  your doctor or health care professional for regular checkups. It may be some time before you see the benefit from this medicine. Do not stop taking your medicine unless your doctor tells you to. Your doctor may order blood tests or other tests to see how you are doing. Women should inform their doctor if they wish to become pregnant or think they might be pregnant. There is a potential for serious side effects to an unborn child. Talk to your health care professional or pharmacist for more information. You should make sure that you get enough calcium and vitamin D while you are taking this medicine. Discuss the foods you eat and the vitamins you take with your health care professional. Some people who take this medicine have severe bone, joint, and/or muscle pain. This medicine may also increase your risk for a broken thigh bone. Tell your doctor right away if you have pain in your upper leg or groin. Tell your doctor if you have any pain that does not go away or that gets worse. What side effects may I notice from receiving this medicine? Side effects that you should report to your doctor or health care professional as soon as possible: -allergic reactions like skin rash, itching or hives, swelling of the face, lips, or tongue -anxiety, confusion, or depression -breathing problems -changes in vision -feeling faint or lightheaded, falls -jaw burning, cramping, pain -muscle cramps, stiffness, or weakness -trouble passing urine or change in the amount of urine Side effects that usually do not require medical attention (report to your doctor or health care professional if they continue or are bothersome): -bone, joint, or muscle pain -  fever -hair loss -irritation at site where injected -loss of appetite -nausea, vomiting -stomach upset -tired This list may not describe all possible side effects. Call your doctor for medical advice about side effects. You may report side effects to FDA at  1-800-FDA-1088. Where should I keep my medicine? This drug is given in a hospital or clinic and will not be stored at home. NOTE: This sheet is a summary. It may not cover all possible information. If you have questions about this medicine, talk to your doctor, pharmacist, or health care provider.  2012, [employer]. (01/06/2011 9:06:58 AM) 

## 2011-11-21 NOTE — Progress Notes (Signed)
Hematology and Oncology Follow Up Visit  [name] [id number] 07/11/1956 56 y.o. 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 He underwent surgical decompression by Dr. Venetia Maxon. Radiation was completed on 08/22/2010.  Now on single agent monthly is Zometa after discontinuation of dexamethasone/Revlimid therapy. 2. 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 He underwent IVC filter placement and is on Coumadin, being monitored through [facility].  3. 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  Interim History:   Mr. Levan denies any specific complaints, specifically no fevers chills shortness of breath or chest pain. His ostomy is functioning well. He is feeling more comfortable with managing it. He continues to be followed closely by Dr. Dorothe Pea. He denies any bleeding or bruising symptoms. He denies any neuropathy symptoms of his upper extremities.  He is quite pleased with the fact that he has now been released from the wound care center, noting the lesions on his left and right foot have completely healed. A detailed review of systems is otherwise noncontributory as noted below.  Review of Systems: Constitutional:  no weight loss, fever, night sweats and feels well Eyes: No complaints ENT: No complaints Cardiovascular: no chest pain or dyspnea on exertion Respiratory: no cough, shortness of breath, or wheezing Neurological: negative Dermatological: Healed bilateral skin ulcers in posterior foot  region. Gastrointestinal: Known ostomy Genito-Urinary: Known foley Hematological and Lymphatic: negative Breast: negative Musculoskeletal: negative Remaining ROS negative.   Medications:   I have reviewed the patient's current medications.  Current Outpatient Prescriptions  Medication Sig Dispense Refill  . acetaminophen (TYLENOL) 325 MG tablet Take 325-650 mg by mouth every 6 (six) hours as needed. For pain.       Marland Kitchen LORazepam (ATIVAN) 0.5 MG tablet Take 0.25-0.5 mg by mouth every 6 (six) hours as needed. For anxiety.       . methocarbamol (ROBAXIN) 500 MG tablet Take 500 mg by mouth every 6 (six) hours as needed. For muscle spasms.      . pantoprazole (PROTONIX) 40 MG tablet Take 40 mg by mouth daily.       Marland Kitchen sulfamethoxazole-trimethoprim (BACTRIM DS) 800-160 MG per tablet Take 1 tablet by mouth daily. Takes on Mondays, Wednesdays, and Fridays.       Marland Kitchen warfarin (COUMADIN) 5 MG tablet Take 5 mg by mouth daily. 7.5mg  Monday & Thursday & 5mg  all other days      . oxyCODONE (OXY IR/ROXICODONE) 5 MG immediate release tablet Take 1 tablet (5 mg total) by mouth every 4 (four) hours as needed for pain. For pain.  30 tablet  0    Allergies: No Known Allergies  Physical Exam: Filed Vitals:   11/21/11 1013  BP: 124/89  Pulse: 98  Temp: 97.3 F (36.3 C)   HEENT:  Sclerae anicteric, conjunctivae pink.  Oropharynx clear.  No mucositis or candidiasis.     Lungs:  Clear to auscultation bilaterally.  No crackles, rhonchi, or wheezes.   Heart:  Regular rate and rhythm.     Musculoskeletal:  Patient is wheelchair-bound due to paraplegia. Lower extremities of sheepskin lined supports  in place. Neuro:  Unchanged, alert and oriented x 3.   Lab Results: Lab Results  Component Value Date   WBC 16.1* 11/21/2011   HGB 11.0* 11/21/2011   HCT 33.9* 11/21/2011   MCV 78.9* 11/21/2011   PLT 399 11/21/2011   NEUTROABS 14.0* 11/21/2011     Chemistry      Component Value Date/Time   NA 144 10/24/2011  0814   K 3.8 10/24/2011 0814   CL 107 10/24/2011 0814   CO2 25 10/24/2011 0814   BUN 12 10/24/2011 0814   CREATININE 0.77 10/24/2011 0814   GLU 166* 09/19/2010 1538      Component Value Date/Time   CALCIUM 8.9 10/24/2011 0814   ALKPHOS 97 10/24/2011 0814   AST 29 10/24/2011 0814   ALT 59* 10/24/2011 0814   BILITOT 0.3 10/24/2011 0814        Assessment:  This is a 56 year old [name] with IgG multiple myeloma diagnosed after presenting with progressive back pain in December 2011 with subsequent hospitalization including MRI of the T-spine which revealed 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 He underwent surgical decompression by Dr. Venetia Maxon. Radiation was completed on 08/22/2010.  Now on single agent monthly is Zometa after discontinuation of dexamethasone/Revlimid therapy. 2. 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 He underwent IVC filter placement and is on Coumadin, being monitored through [facility].  3. 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  Case reviewed with Dr. Pierce Crane.   Plan:  Mr. Revoir will receive Zometa today as scheduled.  We will continue to monitor him monthly with appropriate protein studies, and followup exam prior to Zometa dosing. He will continue to be monitored through [facility], in regards to his PT/INR.  This plan was reviewed with the patient, who voices understanding and agreement.  She knows to call with any changes or problems.    Amada Kingfisher, PA-C 11/21/2011'

## 2011-11-23 LAB — BETA 2 MICROGLOBULIN, SERUM: Beta-2 Microglobulin: 1.68 mg/L (ref 1.01–1.73)

## 2011-11-23 LAB — IMMUNOFIXATION ELECTROPHORESIS
IgA: 123 mg/dL (ref 68–379)
IgG (Immunoglobin G), Serum: 802 mg/dL (ref 650–1600)
IgM, Serum: 42 mg/dL (ref 41–251)
Total Protein, Serum Electrophoresis: 6 g/dL (ref 6.0–8.3)

## 2011-11-23 LAB — COMPREHENSIVE METABOLIC PANEL
ALT: 58 U/L — ABNORMAL HIGH (ref 0–53)
AST: 29 U/L (ref 0–37)
Albumin: 3.6 g/dL (ref 3.5–5.2)
Alkaline Phosphatase: 91 U/L (ref 39–117)
BUN: 6 mg/dL (ref 6–23)
CO2: 25 mEq/L (ref 19–32)
Calcium: 9 mg/dL (ref 8.4–10.5)
Chloride: 108 mEq/L (ref 96–112)
Creatinine, Ser: 0.66 mg/dL (ref 0.50–1.35)
Glucose, Bld: 140 mg/dL — ABNORMAL HIGH (ref 70–99)
Potassium: 3.3 mEq/L — ABNORMAL LOW (ref 3.5–5.3)
Sodium: 143 mEq/L (ref 135–145)
Total Bilirubin: 0.3 mg/dL (ref 0.3–1.2)
Total Protein: 6 g/dL (ref 6.0–8.3)

## 2011-11-23 LAB — KAPPA/LAMBDA LIGHT CHAINS
Kappa free light chain: 0.19 mg/dL — ABNORMAL LOW (ref 0.33–1.94)
Kappa:Lambda Ratio: 0.31 (ref 0.26–1.65)
Lambda Free Lght Chn: 0.62 mg/dL (ref 0.57–2.63)

## 2011-11-23 NOTE — Discharge Summary (Signed)
NAME:  [name], [name]                    ACCOUNT NO.:  MEDICAL RECORD NO.:  [id number]  LOCATION:                                 FACILITY:  PHYSICIAN:  Joanne Gavel, M.D.        DATE OF BIRTH:  [birth year]  DATE OF ADMISSION: DATE OF DISCHARGE:                              DISCHARGE SUMMARY   ADMISSION DIAGNOSIS:  Probably decubitus ulcer, left lateral leg.  DISCHARGE DIAGNOSIS:  Decubitus ulcers of left ankle and left lateral leg.  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  This was treated with pressure relief and collagen.  In the course of his treatment, he developed several more decubitus ulcers, 1st discovered on August 22, 2011, the patient was treated with Kerlix, collagen and pressure relief, and gradually changed to collagen and Profore Lite.  At time of discharge, all the wounds were healed.  DISCHARGE CONDITION:  Good.  RECOMMENDATION:  Avoid pressure.  See [place] p.r.n.     Joanne Gavel, M.D.     RA/MEDQ  D:  11/21/2011  T:  11/21/2011  Job:  [id number]

## 2011-12-11 ENCOUNTER — Telehealth: Payer: Self-pay | Admitting: *Deleted

## 2011-12-11 ENCOUNTER — Encounter: Payer: Self-pay | Admitting: *Deleted

## 2011-12-11 NOTE — Telephone Encounter (Signed)
Spoke with patient's wife Annice Pih at 3:35 pm.  She requested a letter on our letter head to pick up today stating [name] has had Multiple Myeloma since December 2011.  Today is their deadline to pay taxes and they need this letter to fax to the IRS today.  Dr. Donnie Coffin is off today, Mid-level in with a patient scheduled from 3:15 pm until 3:45 pm.  Asked Annice Pih if the initial new patient and initial consultation notes may be used.  Last office note dated 11-21-2011 also printed.  Release of information has been signed effective 10-24-2011.  Annice Pih is willing to try these documents as today is the deadline.  Envelope with Ms. Wilma at front desk for pick up before 4:30pm today.

## 2011-12-19 ENCOUNTER — Ambulatory Visit (HOSPITAL_BASED_OUTPATIENT_CLINIC_OR_DEPARTMENT_OTHER): Payer: Medicare (Managed Care)

## 2011-12-19 ENCOUNTER — Telehealth: Payer: Self-pay | Admitting: *Deleted

## 2011-12-19 ENCOUNTER — Other Ambulatory Visit (HOSPITAL_BASED_OUTPATIENT_CLINIC_OR_DEPARTMENT_OTHER): Payer: Medicare (Managed Care) | Admitting: Lab

## 2011-12-19 ENCOUNTER — Ambulatory Visit (HOSPITAL_BASED_OUTPATIENT_CLINIC_OR_DEPARTMENT_OTHER): Payer: Medicare (Managed Care) | Admitting: Oncology

## 2011-12-19 DIAGNOSIS — C9001 Multiple myeloma in remission: Secondary | ICD-10-CM

## 2011-12-19 DIAGNOSIS — C9000 Multiple myeloma not having achieved remission: Secondary | ICD-10-CM

## 2011-12-19 DIAGNOSIS — Z86718 Personal history of other venous thrombosis and embolism: Secondary | ICD-10-CM

## 2011-12-19 LAB — CBC WITH DIFFERENTIAL/PLATELET
BASO%: 0.2 % (ref 0.0–2.0)
Basophils Absolute: 0 10*3/uL (ref 0.0–0.1)
EOS%: 2.4 % (ref 0.0–7.0)
Eosinophils Absolute: 0.4 10*3/uL (ref 0.0–0.5)
HCT: 33.9 % — ABNORMAL LOW (ref 38.4–49.9)
HGB: 11.8 g/dL — ABNORMAL LOW (ref 13.0–17.1)
LYMPH%: 9.4 % — ABNORMAL LOW (ref 14.0–49.0)
MCH: 25.4 pg — ABNORMAL LOW (ref 27.2–33.4)
MCHC: 34.8 g/dL (ref 32.0–36.0)
MCV: 72.9 fL — ABNORMAL LOW (ref 79.3–98.0)
MONO#: 0.6 10*3/uL (ref 0.1–0.9)
MONO%: 3.4 % (ref 0.0–14.0)
NEUT#: 14.2 10*3/uL — ABNORMAL HIGH (ref 1.5–6.5)
NEUT%: 84.6 % — ABNORMAL HIGH (ref 39.0–75.0)
Platelets: 380 10*3/uL (ref 140–400)
RBC: 4.65 10*6/uL (ref 4.20–5.82)
RDW: 18.9 % — ABNORMAL HIGH (ref 11.0–14.6)
WBC: 16.8 10*3/uL — ABNORMAL HIGH (ref 4.0–10.3)
lymph#: 1.6 10*3/uL (ref 0.9–3.3)

## 2011-12-19 MED ORDER — SODIUM CHLORIDE 0.9 % IV SOLN
Freq: Once | INTRAVENOUS | Status: AC
  Administered 2011-12-19: 12:00:00 via INTRAVENOUS

## 2011-12-19 MED ORDER — ZOLEDRONIC ACID 4 MG/100ML IV SOLN
4.0000 mg | Freq: Once | INTRAVENOUS | Status: AC
  Administered 2011-12-19: 4 mg via INTRAVENOUS
  Filled 2011-12-19: qty 100

## 2011-12-19 NOTE — Telephone Encounter (Signed)
printed out calendar and gave to the patient on 12-19-2011

## 2011-12-19 NOTE — Progress Notes (Signed)
Hematology and Oncology Follow Up Visit  [name] [id number] 1956/06/27 56 y.o. 12/19/2011    HPI: This is a 56 year old [name] with IgG multiple myeloma diagnosed after presenting with progressive back pain in December 2011 with subsequent hospitalization including MRI of the T-spine which revealed 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 He underwent surgical decompression by Dr. Venetia Maxon. Radiation was completed on 08/22/2010.  Now on single agent monthly is Zometa after discontinuation of dexamethasone/Revlimid therapy. 2. 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 He underwent IVC filter placement and is on Coumadin, being monitored through [facility].  3. 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  Interim History:   [name] denies any specific complaints, specifically no fevers chills shortness of breath or chest pain. His ostomy is functioning well. He is feeling more comfortable with managing it. He continues to be followed closely by Dr. Dorothe Pea. He denies any bleeding or bruising symptoms. He denies any neuropathy symptoms of his upper extremities.  He is quite pleased with the fact that he has now been released from the wound care center, noting the lesions on his left and right foot have completely healed. A detailed review of systems is otherwise noncontributory as noted below.  Review of Systems: Constitutional:  no weight loss, fever, night sweats and feels well Eyes: No complaints ENT: No complaints Cardiovascular: no chest pain or dyspnea on exertion Respiratory: no cough, shortness of breath, or wheezing Neurological: negative Dermatological: Healed bilateral skin ulcers in posterior foot  region. Gastrointestinal: Known ostomy Genito-Urinary: Known foley Hematological and Lymphatic: negative Breast: negative Musculoskeletal: negative Remaining ROS negative.   Medications:   I have reviewed the patient's current medications.  Current Outpatient Prescriptions  Medication Sig Dispense Refill  . acetaminophen (TYLENOL) 325 MG tablet Take 325-650 mg by mouth every 6 (six) hours as needed. For pain.       Marland Kitchen LORazepam (ATIVAN) 0.5 MG tablet Take 0.25-0.5 mg by mouth every 6 (six) hours as needed. For anxiety.       . methocarbamol (ROBAXIN) 500 MG tablet Take 500 mg by mouth every 6 (six) hours as needed. For muscle spasms.      Marland Kitchen oxyCODONE (OXY IR/ROXICODONE) 5 MG immediate release tablet Take 1 tablet (5 mg total) by mouth every 4 (four) hours as needed for pain. For pain.  30 tablet  0  . pantoprazole (PROTONIX) 40 MG tablet Take 40 mg by mouth daily.       Marland Kitchen sulfamethoxazole-trimethoprim (BACTRIM DS) 800-160 MG per tablet Take 1 tablet by mouth daily. Takes on Mondays, Wednesdays, and Fridays.       Marland Kitchen warfarin (COUMADIN) 5 MG tablet Take 5 mg by mouth daily. 7.5mg  Monday & Thursday & 5mg  all other days        Allergies: No Known Allergies  Physical Exam: Filed Vitals:   12/19/11 1045  BP: 118/83  Pulse: 125  Temp: 98.6 F (37 C)   HEENT:  Sclerae anicteric, conjunctivae pink.  Oropharynx clear.  No mucositis or candidiasis.     Lungs:  Clear to auscultation bilaterally.  No crackles, rhonchi, or wheezes.   Heart:  Regular rate and rhythm.     Musculoskeletal:  Patient is wheelchair-bound due to paraplegia. Lower extremities of sheepskin lined supports  in place. Neuro:  Unchanged, alert and oriented x 3.   Lab Results: Lab Results  Component Value Date   WBC 16.8* 12/19/2011   HGB 11.8* 12/19/2011   HCT 33.9* 12/19/2011   MCV 72.9* 12/19/2011   PLT 380 12/19/2011   NEUTROABS 14.2* 12/19/2011     Chemistry      Component Value Date/Time   NA 143 11/21/2011  0934   K 3.3* 11/21/2011 0934   CL 108 11/21/2011 0934   CO2 25 11/21/2011 0934   BUN 6 11/21/2011 0934   CREATININE 0.66 11/21/2011 0934   GLU 166* 09/19/2010 1538      Component Value Date/Time   CALCIUM 9.0 11/21/2011 0934   ALKPHOS 91 11/21/2011 0934   AST 29 11/21/2011 0934   ALT 58* 11/21/2011 0934   BILITOT 0.3 11/21/2011 0934        Assessment:  This is a 56 year old [name] with IgG multiple myeloma diagnosed after presenting with progressive back pain in December 2011 with subsequent hospitalization including MRI of the T-spine which revealed 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 He underwent surgical decompression by Dr. Venetia Maxon. Radiation was completed on 08/22/2010.  Now on single agent monthly is Zometa after discontinuation of dexamethasone/Revlimid therapy. Most recent protein chemistries look like myeloma is in remission. 2. 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 He underwent IVC filter placement and is on Coumadin, being monitored through [facility].  3. 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     Plan:  [name] will receive Zometa today as scheduled.  We will continue to monitor him monthly with appropriate protein studies, and followup exam prior to Zometa dosing. He will continue to be monitored through [facility], in regards to his PT/INR.  This plan was reviewed with the patient, who voices understanding and agreement.  She knows to call with any changes or problems.    Pierce Crane, MD 12/19/2011'

## 2011-12-20 ENCOUNTER — Ambulatory Visit: Payer: Medicare (Managed Care)

## 2011-12-21 LAB — IMMUNOFIXATION ELECTROPHORESIS
IgA: 174 mg/dL (ref 68–379)
IgG (Immunoglobin G), Serum: 1080 mg/dL (ref 650–1600)
IgM, Serum: 56 mg/dL (ref 41–251)
Total Protein, Serum Electrophoresis: 6.6 g/dL (ref 6.0–8.3)

## 2011-12-21 LAB — COMPREHENSIVE METABOLIC PANEL
ALT: 62 U/L — ABNORMAL HIGH (ref 0–53)
AST: 25 U/L (ref 0–37)
Albumin: 3.8 g/dL (ref 3.5–5.2)
Alkaline Phosphatase: 119 U/L — ABNORMAL HIGH (ref 39–117)
BUN: 16 mg/dL (ref 6–23)
CO2: 26 mEq/L (ref 19–32)
Calcium: 9.4 mg/dL (ref 8.4–10.5)
Chloride: 106 mEq/L (ref 96–112)
Creatinine, Ser: 0.84 mg/dL (ref 0.50–1.35)
Glucose, Bld: 99 mg/dL (ref 70–99)
Potassium: 3.6 mEq/L (ref 3.5–5.3)
Sodium: 143 mEq/L (ref 135–145)
Total Bilirubin: 0.3 mg/dL (ref 0.3–1.2)
Total Protein: 6.6 g/dL (ref 6.0–8.3)

## 2011-12-21 LAB — BETA 2 MICROGLOBULIN, SERUM: Beta-2 Microglobulin: 1.82 mg/L — ABNORMAL HIGH (ref 1.01–1.73)

## 2011-12-21 LAB — KAPPA/LAMBDA LIGHT CHAINS
Kappa free light chain: 0.3 mg/dL — ABNORMAL LOW (ref 0.33–1.94)
Kappa:Lambda Ratio: 0.36 (ref 0.26–1.65)
Lambda Free Lght Chn: 0.84 mg/dL (ref 0.57–2.63)

## 2012-01-10 ENCOUNTER — Encounter: Payer: Self-pay | Admitting: Oncology

## 2012-01-10 NOTE — Progress Notes (Signed)
Put medical alert form on nurse's desk.

## 2012-01-16 ENCOUNTER — Telehealth: Payer: Self-pay | Admitting: Oncology

## 2012-01-16 ENCOUNTER — Ambulatory Visit (HOSPITAL_BASED_OUTPATIENT_CLINIC_OR_DEPARTMENT_OTHER): Payer: Medicare (Managed Care)

## 2012-01-16 ENCOUNTER — Ambulatory Visit (HOSPITAL_BASED_OUTPATIENT_CLINIC_OR_DEPARTMENT_OTHER): Admitting: Physician Assistant

## 2012-01-16 ENCOUNTER — Other Ambulatory Visit (HOSPITAL_BASED_OUTPATIENT_CLINIC_OR_DEPARTMENT_OTHER): Payer: Medicare (Managed Care) | Admitting: Lab

## 2012-01-16 ENCOUNTER — Encounter: Payer: Self-pay | Admitting: Physician Assistant

## 2012-01-16 VITALS — BP 119/86 | HR 90 | Temp 98.1°F

## 2012-01-16 DIAGNOSIS — R1084 Generalized abdominal pain: Secondary | ICD-10-CM

## 2012-01-16 DIAGNOSIS — E669 Obesity, unspecified: Secondary | ICD-10-CM

## 2012-01-16 DIAGNOSIS — C9000 Multiple myeloma not having achieved remission: Secondary | ICD-10-CM

## 2012-01-16 DIAGNOSIS — R42 Dizziness and giddiness: Secondary | ICD-10-CM

## 2012-01-16 DIAGNOSIS — N319 Neuromuscular dysfunction of bladder, unspecified: Secondary | ICD-10-CM

## 2012-01-16 DIAGNOSIS — C9001 Multiple myeloma in remission: Secondary | ICD-10-CM

## 2012-01-16 DIAGNOSIS — K592 Neurogenic bowel, not elsewhere classified: Secondary | ICD-10-CM

## 2012-01-16 DIAGNOSIS — G959 Disease of spinal cord, unspecified: Secondary | ICD-10-CM

## 2012-01-16 DIAGNOSIS — K5720 Diverticulitis of large intestine with perforation and abscess without bleeding: Secondary | ICD-10-CM

## 2012-01-16 DIAGNOSIS — Z86718 Personal history of other venous thrombosis and embolism: Secondary | ICD-10-CM

## 2012-01-16 DIAGNOSIS — IMO0002 Reserved for concepts with insufficient information to code with codable children: Secondary | ICD-10-CM

## 2012-01-16 LAB — CBC WITH DIFFERENTIAL/PLATELET
BASO%: 0.2 % (ref 0.0–2.0)
Basophils Absolute: 0 10*3/uL (ref 0.0–0.1)
EOS%: 1.2 % (ref 0.0–7.0)
Eosinophils Absolute: 0.2 10*3/uL (ref 0.0–0.5)
HCT: 33.3 % — ABNORMAL LOW (ref 38.4–49.9)
HGB: 11.7 g/dL — ABNORMAL LOW (ref 13.0–17.1)
LYMPH%: 12 % — ABNORMAL LOW (ref 14.0–49.0)
MCH: 25.6 pg — ABNORMAL LOW (ref 27.2–33.4)
MCHC: 35.1 g/dL (ref 32.0–36.0)
MCV: 72.9 fL — ABNORMAL LOW (ref 79.3–98.0)
MONO#: 0.8 10*3/uL (ref 0.1–0.9)
MONO%: 4.8 % (ref 0.0–14.0)
NEUT#: 14.2 10*3/uL — ABNORMAL HIGH (ref 1.5–6.5)
NEUT%: 81.8 % — ABNORMAL HIGH (ref 39.0–75.0)
Platelets: 325 10*3/uL (ref 140–400)
RBC: 4.57 10*6/uL (ref 4.20–5.82)
RDW: 18.2 % — ABNORMAL HIGH (ref 11.0–14.6)
WBC: 17.4 10*3/uL — ABNORMAL HIGH (ref 4.0–10.3)
lymph#: 2.1 10*3/uL (ref 0.9–3.3)

## 2012-01-16 MED ORDER — ZOLEDRONIC ACID 4 MG/100ML IV SOLN
4.0000 mg | Freq: Once | INTRAVENOUS | Status: AC
  Administered 2012-01-16: 4 mg via INTRAVENOUS
  Filled 2012-01-16: qty 100

## 2012-01-16 MED ORDER — SODIUM CHLORIDE 0.9 % IV SOLN
Freq: Once | INTRAVENOUS | Status: AC
  Administered 2012-01-16: 12:00:00 via INTRAVENOUS

## 2012-01-16 NOTE — Progress Notes (Signed)
Hematology and Oncology Follow Up Visit  [name] [id number] [birth year] 56 y.o. 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 He underwent surgical decompression by Dr. Venetia Howell. Radiation was completed on 08/22/2010.  Now on single agent monthly is Zometa after discontinuation of dexamethasone/Revlimid therapy.  2. 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 He underwent IVC filter placement and is on Coumadin, being monitored through [facility].   3. 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  Interim History:   [name] denies any specific complaints, specifically no fevers chills shortness of breath or chest pain. His ostomy is functioning well. He is feeling more comfortable with managing it. He continues to be followed closely by Dr. Dorothe Howell. He denies any bleeding or bruising symptoms. He denies any neuropathy symptoms of his upper extremities.  He is quite pleased with the fact that he has now been released from the wound care center, noting the lesions on his left and right foot have completely healed. A detailed review of systems is otherwise noncontributory as noted below.  Review of Systems: Constitutional:  no weight loss, fever, night sweats and feels well Eyes: No complaints ENT: No complaints Cardiovascular: no chest pain or dyspnea on exertion Respiratory: no cough, shortness of breath, or wheezing Neurological: negative Dermatological: Healed bilateral skin ulcers in posterior  foot region. Gastrointestinal: Known ostomy Genito-Urinary: Known foley Hematological and Lymphatic: negative Breast: negative Musculoskeletal: negative Remaining ROS negative.   Medications:   I have reviewed the patient's current medications.  Current Outpatient Prescriptions  Medication Sig Dispense Refill  . acetaminophen (TYLENOL) 325 MG tablet Take 325-650 mg by mouth every 6 (six) hours as needed. For pain.       Marland Kitchen LORazepam (ATIVAN) 0.5 MG tablet Take 0.25-0.5 mg by mouth every 6 (six) hours as needed. For anxiety.       . methocarbamol (ROBAXIN) 500 MG tablet Take 500 mg by mouth every 6 (six) hours as needed. For muscle spasms.      Marland Kitchen oxyCODONE (OXY IR/ROXICODONE) 5 MG immediate release tablet Take 1 tablet (5 mg total) by mouth every 4 (four) hours as needed for pain. For pain.  30 tablet  0  . pantoprazole (PROTONIX) 40 MG tablet Take 40 mg by mouth daily.       Marland Kitchen sulfamethoxazole-trimethoprim (BACTRIM DS) 800-160 MG per tablet Take 1 tablet by mouth daily. Takes on Mondays, Wednesdays, and Fridays.       Marland Kitchen warfarin (COUMADIN) 5 MG tablet Take 5 mg by mouth daily. 7.5mg  Monday & Thursday & 5mg  all other days        Allergies: No Known Allergies  Physical Exam: Filed Vitals:   01/16/12 1103  BP: 119/86  Pulse: 90  Temp: 98.1 F (36.7 C)   HEENT:  Sclerae anicteric, conjunctivae pink.  Oropharynx clear.  No mucositis or candidiasis.     Lungs:  Clear to auscultation bilaterally.  No crackles, rhonchi, or wheezes.   Heart:  Regular rate and rhythm.     Musculoskeletal:  Patient is wheelchair-bound due to paraplegia. Lower extremities of sheepskin  lined supports in place. Neuro:  Unchanged, alert and oriented x 3.   Lab Results: Lab Results  Component Value Date   WBC 17.4* 01/16/2012   HGB 11.7* 01/16/2012   HCT 33.3* 01/16/2012   MCV 72.9* 01/16/2012   PLT 325 01/16/2012   NEUTROABS 14.2* 01/16/2012     Chemistry      Component Value Date/Time   NA 143  12/19/2011 1004   K 3.6 12/19/2011 1004   CL 106 12/19/2011 1004   CO2 26 12/19/2011 1004   BUN 16 12/19/2011 1004   CREATININE 0.84 12/19/2011 1004   GLU 166* 09/19/2010 1538      Component Value Date/Time   CALCIUM 9.4 12/19/2011 1004   ALKPHOS 119* 12/19/2011 1004   AST 25 12/19/2011 1004   ALT 62* 12/19/2011 1004   BILITOT 0.3 12/19/2011 1004        Assessment:  This is a 56 year old [name] with IgG multiple myeloma diagnosed after presenting with progressive back pain in December 2011 with subsequent hospitalization including MRI of the T-spine which revealed 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 He underwent surgical decompression by Dr. Venetia Howell. Radiation was completed on 08/22/2010.  Now on single agent monthly is Zometa after discontinuation of dexamethasone/Revlimid therapy. Most recent protein chemistries look like myeloma is in remission.  2. 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 He underwent IVC filter placement and is on Coumadin, being monitored through [facility].   3. 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     Plan:  [name] will receive Zometa today as scheduled.  We will continue to monitor him monthly with appropriate protein studies, and followup exam prior to Zometa dosing. He will continue to be monitored through [facility], in regards to his PT/INR.  This plan was reviewed with the patient, who voices understanding and agreement.  She knows to call with any changes or problems.    Amada Kingfisher, MD 01/16/2012'

## 2012-01-16 NOTE — Telephone Encounter (Signed)
gve the pt his July-sept 2013 appt calendar. Pt is aware his tx will be added. Sent michelle a staff message

## 2012-01-16 NOTE — Patient Instructions (Addendum)
[facility] Discharge Instructions for Patients Receiving Chemotherapy  Today you received the following chemotherapy agents Zometa To help prevent nausea and vomiting after your treatment, we encourage you to take your nausea medication as prescribed. If you develop nausea and vomiting that is not controlled by your nausea medication, call the clinic. If it is after clinic hours your family physician or the after hours number for the clinic or go to the Emergency Department.   BELOW ARE SYMPTOMS THAT SHOULD BE REPORTED IMMEDIATELY:  *FEVER GREATER THAN 100.5 F  *CHILLS WITH OR WITHOUT FEVER  NAUSEA AND VOMITING THAT IS NOT CONTROLLED WITH YOUR NAUSEA MEDICATION  *UNUSUAL SHORTNESS OF BREATH  *UNUSUAL BRUISING OR BLEEDING  TENDERNESS IN MOUTH AND THROAT WITH OR WITHOUT PRESENCE OF ULCERS  *URINARY PROBLEMS  *BOWEL PROBLEMS  UNUSUAL RASH Items with * indicate a potential emergency and should be followed up as soon as possible.  One of the nurses will contact you 24 hours after your treatment. Please let the nurse know about any problems that you may have experienced. Feel free to call the clinic you have any questions or concerns. The clinic phone number is [phone].   I have been informed and understand all the instructions given to me. I know to contact the clinic, my physician, or go to the Emergency Department if any problems should occur. I do not have any questions at this time, but understand that I may call the clinic during office hours   should I have any questions or need assistance in obtaining follow up care.    __________________________________________  _____________  __________ Signature of Patient or Authorized Representative            Date                   Time    __________________________________________ Nurse's Signature    

## 2012-01-18 ENCOUNTER — Telehealth: Payer: Self-pay | Admitting: *Deleted

## 2012-01-18 NOTE — Telephone Encounter (Signed)
Per staff message I have scheduled appts. JMW  

## 2012-01-19 ENCOUNTER — Ambulatory Visit: Payer: Medicare (Managed Care) | Admitting: Oncology

## 2012-01-22 LAB — COMPREHENSIVE METABOLIC PANEL
ALT: 57 U/L — ABNORMAL HIGH (ref 0–53)
AST: 27 U/L (ref 0–37)
Albumin: 4.1 g/dL (ref 3.5–5.2)
Alkaline Phosphatase: 103 U/L (ref 39–117)
BUN: 16 mg/dL (ref 6–23)
CO2: 23 mEq/L (ref 19–32)
Calcium: 9.4 mg/dL (ref 8.4–10.5)
Chloride: 104 mEq/L (ref 96–112)
Creatinine, Ser: 0.78 mg/dL (ref 0.50–1.35)
Glucose, Bld: 111 mg/dL — ABNORMAL HIGH (ref 70–99)
Potassium: 3.8 mEq/L (ref 3.5–5.3)
Sodium: 140 mEq/L (ref 135–145)
Total Bilirubin: 0.4 mg/dL (ref 0.3–1.2)
Total Protein: 7 g/dL (ref 6.0–8.3)

## 2012-01-22 LAB — SPEP & IFE WITH QIG
Albumin ELP: 49.2 % — ABNORMAL LOW (ref 55.8–66.1)
Alpha-1-Globulin: 7.3 % — ABNORMAL HIGH (ref 2.9–4.9)
Alpha-2-Globulin: 18 % — ABNORMAL HIGH (ref 7.1–11.8)
Beta 2: 6.3 % (ref 3.2–6.5)
Beta Globulin: 5.9 % (ref 4.7–7.2)
Gamma Globulin: 13.3 % (ref 11.1–18.8)
IgA: 153 mg/dL (ref 68–379)
IgG (Immunoglobin G), Serum: 1020 mg/dL (ref 650–1600)
IgM, Serum: 48 mg/dL (ref 41–251)
M-Spike, %: 0.35 g/dL
Total Protein, Serum Electrophoresis: 7 g/dL (ref 6.0–8.3)

## 2012-01-22 LAB — KAPPA/LAMBDA LIGHT CHAINS
Kappa free light chain: 0.32 mg/dL — ABNORMAL LOW (ref 0.33–1.94)
Kappa:Lambda Ratio: 0.57 (ref 0.26–1.65)
Lambda Free Lght Chn: 0.56 mg/dL — ABNORMAL LOW (ref 0.57–2.63)

## 2012-01-22 LAB — LACTATE DEHYDROGENASE: LDH: 168 U/L (ref 94–250)

## 2012-02-13 ENCOUNTER — Ambulatory Visit (HOSPITAL_BASED_OUTPATIENT_CLINIC_OR_DEPARTMENT_OTHER)

## 2012-02-13 ENCOUNTER — Other Ambulatory Visit (HOSPITAL_BASED_OUTPATIENT_CLINIC_OR_DEPARTMENT_OTHER): Admitting: Lab

## 2012-02-13 ENCOUNTER — Ambulatory Visit (HOSPITAL_BASED_OUTPATIENT_CLINIC_OR_DEPARTMENT_OTHER): Admitting: Physician Assistant

## 2012-02-13 VITALS — BP 120/89 | HR 102 | Temp 98.3°F

## 2012-02-13 DIAGNOSIS — E669 Obesity, unspecified: Secondary | ICD-10-CM

## 2012-02-13 DIAGNOSIS — C9001 Multiple myeloma in remission: Secondary | ICD-10-CM

## 2012-02-13 DIAGNOSIS — R42 Dizziness and giddiness: Secondary | ICD-10-CM

## 2012-02-13 DIAGNOSIS — Z86718 Personal history of other venous thrombosis and embolism: Secondary | ICD-10-CM

## 2012-02-13 DIAGNOSIS — N319 Neuromuscular dysfunction of bladder, unspecified: Secondary | ICD-10-CM

## 2012-02-13 DIAGNOSIS — K5720 Diverticulitis of large intestine with perforation and abscess without bleeding: Secondary | ICD-10-CM

## 2012-02-13 DIAGNOSIS — K592 Neurogenic bowel, not elsewhere classified: Secondary | ICD-10-CM

## 2012-02-13 DIAGNOSIS — G959 Disease of spinal cord, unspecified: Secondary | ICD-10-CM

## 2012-02-13 DIAGNOSIS — C9000 Multiple myeloma not having achieved remission: Secondary | ICD-10-CM

## 2012-02-13 DIAGNOSIS — R1084 Generalized abdominal pain: Secondary | ICD-10-CM

## 2012-02-13 DIAGNOSIS — IMO0002 Reserved for concepts with insufficient information to code with codable children: Secondary | ICD-10-CM

## 2012-02-13 LAB — CBC WITH DIFFERENTIAL/PLATELET
BASO%: 0.1 % (ref 0.0–2.0)
Basophils Absolute: 0 10*3/uL (ref 0.0–0.1)
EOS%: 1.1 % (ref 0.0–7.0)
Eosinophils Absolute: 0.2 10*3/uL (ref 0.0–0.5)
HCT: 32.8 % — ABNORMAL LOW (ref 38.4–49.9)
HGB: 11.4 g/dL — ABNORMAL LOW (ref 13.0–17.1)
LYMPH%: 10.6 % — ABNORMAL LOW (ref 14.0–49.0)
MCH: 25.1 pg — ABNORMAL LOW (ref 27.2–33.4)
MCHC: 34.8 g/dL (ref 32.0–36.0)
MCV: 72.1 fL — ABNORMAL LOW (ref 79.3–98.0)
MONO#: 0.8 10*3/uL (ref 0.1–0.9)
MONO%: 4.5 % (ref 0.0–14.0)
NEUT#: 15.6 10*3/uL — ABNORMAL HIGH (ref 1.5–6.5)
NEUT%: 83.7 % — ABNORMAL HIGH (ref 39.0–75.0)
Platelets: 449 10*3/uL — ABNORMAL HIGH (ref 140–400)
RBC: 4.55 10*6/uL (ref 4.20–5.82)
RDW: 17.5 % — ABNORMAL HIGH (ref 11.0–14.6)
WBC: 18.6 10*3/uL — ABNORMAL HIGH (ref 4.0–10.3)
lymph#: 2 10*3/uL (ref 0.9–3.3)
nRBC: 0 % (ref 0–0)

## 2012-02-13 MED ORDER — SODIUM CHLORIDE 0.9 % IV SOLN
Freq: Once | INTRAVENOUS | Status: AC
  Administered 2012-02-13: 12:00:00 via INTRAVENOUS

## 2012-02-13 MED ORDER — ZOLEDRONIC ACID 4 MG/100ML IV SOLN
4.0000 mg | Freq: Once | INTRAVENOUS | Status: AC
  Administered 2012-02-13: 4 mg via INTRAVENOUS
  Filled 2012-02-13: qty 100

## 2012-02-13 NOTE — Patient Instructions (Signed)
[facility] Discharge Instructions for Patients Receiving Chemotherapy  Today you received the following: Zometa   If you develop nausea and vomiting that is not controlled by your nausea medication, call the clinic. If it is after clinic hours your family physician or the after hours number for the clinic or go to the Emergency Department.   BELOW ARE SYMPTOMS THAT SHOULD BE REPORTED IMMEDIATELY:  *FEVER GREATER THAN 100.5 F  *CHILLS WITH OR WITHOUT FEVER  NAUSEA AND VOMITING THAT IS NOT CONTROLLED WITH YOUR NAUSEA MEDICATION  *UNUSUAL SHORTNESS OF BREATH  *UNUSUAL BRUISING OR BLEEDING  TENDERNESS IN MOUTH AND THROAT WITH OR WITHOUT PRESENCE OF ULCERS  *URINARY PROBLEMS  *BOWEL PROBLEMS  UNUSUAL RASH Items with * indicate a potential emergency and should be followed up as soon as possible.  Feel free to call the clinic you have any questions or concerns. The clinic phone number is [phone].   I have been informed and understand all the instructions given to me. I know to contact the clinic, my physician, or go to the Emergency Department if any problems should occur. I do not have any questions at this time, but understand that I may call the clinic during office hours   should I have any questions or need assistance in obtaining follow up care.    __________________________________________  _____________  __________ Signature of Patient or Authorized Representative            Date                   Time    __________________________________________ Nurse's Signature

## 2012-02-13 NOTE — Progress Notes (Signed)
Hematology and Oncology Follow Up Visit  [name] [id number] [birth year] 56 y.o. 02/13/2012    HPI: This is a 56 year old [name] with IgG multiple myeloma diagnosed after presenting with progressive back pain in December 2011 with subsequent hospitalization including MRI of the T-spine which revealed 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 He underwent surgical decompression by Dr. Venetia Howell. Radiation was completed on 08/22/2010.  Now on single agent monthly is Zometa after discontinuation of dexamethasone/Revlimid therapy.  2. 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 He underwent IVC filter placement and is on Coumadin, being monitored through [facility].   3. 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  Interim History:   [name] denies any specific complaints, specifically no fevers chills shortness of breath or chest pain. His ostomy is functioning well. He is feeling more comfortable with managing it. He continues to be followed closely by [name]. He denies any bleeding or bruising symptoms. He denies any neuropathy symptoms of his upper extremities.  A detailed review of systems is otherwise noncontributory as noted below.  Review of Systems: Constitutional:  no weight loss, fever, night sweats and feels well Eyes: No complaints ENT: No complaints Cardiovascular: no chest pain or dyspnea on exertion Respiratory: no cough, shortness of breath, or wheezing Neurological: negative Dermatological: Healed bilateral skin ulcers in posterior foot region. Gastrointestinal: Known ostomy Genito-Urinary: Known foley Hematological and Lymphatic: negative Breast: negative Musculoskeletal:  negative Remaining ROS negative.   Medications:   I have reviewed the patient's current medications.  Current Outpatient Prescriptions  Medication Sig Dispense Refill  . acetaminophen (TYLENOL) 325 MG tablet Take 325-650 mg by mouth every 6 (six) hours as needed. For pain.       Marland Kitchen LORazepam (ATIVAN) 0.5 MG tablet Take 0.25-0.5 mg by mouth every 6 (six) hours as needed. For anxiety.       . methocarbamol (ROBAXIN) 500 MG tablet Take 500 mg by mouth every 6 (six) hours as needed. For muscle spasms.      Marland Kitchen oxyCODONE (OXY IR/ROXICODONE) 5 MG immediate release tablet Take 1 tablet (5 mg total) by mouth every 4 (four) hours as needed for pain. For pain.  30 tablet  0  . pantoprazole (PROTONIX) 40 MG tablet Take 40 mg by mouth daily.       Marland Kitchen sulfamethoxazole-trimethoprim (BACTRIM DS) 800-160 MG per tablet Take 1 tablet by mouth daily. Takes on Mondays, Wednesdays, and Fridays.       Marland Kitchen warfarin (COUMADIN) 5 MG tablet Take 5 mg by mouth daily. 7.5mg  Monday & Thursday & 5mg  all other days        Allergies: No Known Allergies  Physical Exam: Filed Vitals:   02/13/12 1128  BP: 120/89  Pulse: 102  Temp: 98.3 F (36.8 C)   HEENT:  Sclerae anicteric, conjunctivae pink.  Oropharynx clear.  No mucositis or candidiasis.     Lungs:  Clear to auscultation bilaterally.  No crackles, rhonchi, or wheezes.   Heart:  Regular rate and rhythm.     Musculoskeletal:  Patient is wheelchair-bound due to paraplegia.  Neuro:  Unchanged, alert and oriented x 3.   Lab Results:  No results found for this basename: PROTEIN24HR   Lab Results  Component Value Date   WBC 18.6* 02/13/2012  HGB 11.4* 02/13/2012   HCT 32.8* 02/13/2012   MCV 72.1* 02/13/2012   PLT 449* 02/13/2012   NEUTROABS 15.6* 02/13/2012     Chemistry      Component Value Date/Time   NA 140 01/16/2012 1043   K 3.8 01/16/2012 1043   CL 104 01/16/2012 1043   CO2 23 01/16/2012 1043   BUN 16 01/16/2012 1043   CREATININE 0.78 01/16/2012 1043   GLU  166* 09/19/2010 1538      Component Value Date/Time   CALCIUM 9.4 01/16/2012 1043   ALKPHOS 103 01/16/2012 1043   AST 27 01/16/2012 1043   ALT 57* 01/16/2012 1043   BILITOT 0.4 01/16/2012 1043        Assessment:  This is a 56 year old [name] with IgG multiple myeloma diagnosed after presenting with progressive back pain in December 2011 with subsequent hospitalization including MRI of the T-spine which revealed 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 He underwent surgical decompression by Dr. Venetia Howell. Radiation was completed on 08/22/2010.  Now on single agent monthly is Zometa after discontinuation of dexamethasone/Revlimid therapy. Most recent protein chemistries look like myeloma is in remission.  2. 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 He underwent IVC filter placement and is on Coumadin, being monitored through [facility].   3. 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     Plan:  [name] will receive Zometa today as scheduled.  We will continue to monitor him monthly with appropriate protein studies, and followup exam prior to Zometa dosing. He will continue to be monitored through [facility], in regards to his PT/INR.  This plan was reviewed with the patient, who voices understanding and agreement.  She knows to call with any changes or problems.    Gary Kingfisher, MD 02/13/2012'

## 2012-02-15 LAB — COMPREHENSIVE METABOLIC PANEL
ALT: 52 U/L (ref 0–53)
AST: 25 U/L (ref 0–37)
Albumin: 3.8 g/dL (ref 3.5–5.2)
Alkaline Phosphatase: 117 U/L (ref 39–117)
BUN: 16 mg/dL (ref 6–23)
CO2: 22 mEq/L (ref 19–32)
Calcium: 9.8 mg/dL (ref 8.4–10.5)
Chloride: 104 mEq/L (ref 96–112)
Creatinine, Ser: 0.88 mg/dL (ref 0.50–1.35)
Glucose, Bld: 105 mg/dL — ABNORMAL HIGH (ref 70–99)
Potassium: 3.8 mEq/L (ref 3.5–5.3)
Sodium: 139 mEq/L (ref 135–145)
Total Bilirubin: 0.3 mg/dL (ref 0.3–1.2)
Total Protein: 7 g/dL (ref 6.0–8.3)

## 2012-02-15 LAB — KAPPA/LAMBDA LIGHT CHAINS
Kappa free light chain: 1.12 mg/dL (ref 0.33–1.94)
Kappa:Lambda Ratio: 1.2 (ref 0.26–1.65)
Lambda Free Lght Chn: 0.93 mg/dL (ref 0.57–2.63)

## 2012-02-15 LAB — IFE INTERPRETATION

## 2012-02-15 LAB — PROTEIN ELECTROPHORESIS, SERUM, WITH REFLEX
Albumin ELP: 44.8 % — ABNORMAL LOW (ref 55.8–66.1)
Alpha-1-Globulin: 9.5 % — ABNORMAL HIGH (ref 2.9–4.9)
Alpha-2-Globulin: 19.4 % — ABNORMAL HIGH (ref 7.1–11.8)
Beta 2: 6.2 % (ref 3.2–6.5)
Beta Globulin: 6.1 % (ref 4.7–7.2)
Gamma Globulin: 14 % (ref 11.1–18.8)
M-Spike, %: 0.35 g/dL
Total Protein, Serum Electrophoresis: 7 g/dL (ref 6.0–8.3)

## 2012-02-15 LAB — IGG, IGA, IGM
IgA: 173 mg/dL (ref 68–379)
IgG (Immunoglobin G), Serum: 1310 mg/dL (ref 650–1600)
IgM, Serum: 53 mg/dL (ref 41–251)

## 2012-02-15 LAB — BETA 2 MICROGLOBULIN, SERUM: Beta-2 Microglobulin: 1.7 mg/L (ref 1.01–1.73)

## 2012-03-12 ENCOUNTER — Ambulatory Visit

## 2012-03-13 ENCOUNTER — Telehealth: Payer: Self-pay | Admitting: Oncology

## 2012-03-13 ENCOUNTER — Ambulatory Visit
Admission: RE | Admit: 2012-03-13 | Discharge: 2012-03-13 | Disposition: A | Discharge status: Home / Self Care | Payer: No Typology Code available for payment source | Source: Ambulatory Visit | Attending: Family Medicine | Admitting: Family Medicine

## 2012-03-13 ENCOUNTER — Ambulatory Visit: Admitting: Oncology

## 2012-03-13 ENCOUNTER — Other Ambulatory Visit: Payer: Self-pay | Admitting: Family Medicine

## 2012-03-13 ENCOUNTER — Other Ambulatory Visit: Admitting: Lab

## 2012-03-13 ENCOUNTER — Ambulatory Visit

## 2012-03-13 DIAGNOSIS — C9000 Multiple myeloma not having achieved remission: Secondary | ICD-10-CM

## 2012-03-13 DIAGNOSIS — G8918 Other acute postprocedural pain: Secondary | ICD-10-CM

## 2012-03-13 DIAGNOSIS — R109 Unspecified abdominal pain: Secondary | ICD-10-CM

## 2012-03-13 MED ORDER — IOHEXOL 300 MG/ML  SOLN
125.0000 mL | Freq: Once | INTRAMUSCULAR | Status: AC | PRN
  Administered 2012-03-13: 125 mL via INTRAVENOUS

## 2012-03-13 NOTE — Telephone Encounter (Signed)
Sonia caled from dr Melony Overly office regarding the pt needing to cancel his appt for today. Advised sonia of the next f/u appt with dr Donnie Coffin

## 2012-03-14 ENCOUNTER — Telehealth (INDEPENDENT_AMBULATORY_CARE_PROVIDER_SITE_OTHER): Payer: Self-pay | Admitting: General Surgery

## 2012-03-14 NOTE — Telephone Encounter (Signed)
Dr. Modena Jansky called me about this patient because he had an episode of back pain yesterday and CT scan was ordered to evaluate.  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  He also has not had a BM for 2 days and he was concerned if he could have obstruction.  He says that he is not having pain today and that his abdomen is not distended.  There are no signs of strangulation or obstruction on CT as well.  I do not think that he has obstruction, but this certainly could happen.  I recommended that if he continues to have constipation or if he develops any pain, fevers, or distension that he come into the hospital for evaluation.

## 2012-04-09 ENCOUNTER — Ambulatory Visit

## 2012-04-10 ENCOUNTER — Ambulatory Visit (HOSPITAL_BASED_OUTPATIENT_CLINIC_OR_DEPARTMENT_OTHER): Payer: No Typology Code available for payment source | Admitting: Oncology

## 2012-04-10 ENCOUNTER — Other Ambulatory Visit (HOSPITAL_BASED_OUTPATIENT_CLINIC_OR_DEPARTMENT_OTHER): Payer: No Typology Code available for payment source

## 2012-04-10 ENCOUNTER — Ambulatory Visit (HOSPITAL_BASED_OUTPATIENT_CLINIC_OR_DEPARTMENT_OTHER): Payer: No Typology Code available for payment source

## 2012-04-10 ENCOUNTER — Telehealth: Payer: Self-pay | Admitting: *Deleted

## 2012-04-10 VITALS — BP 128/87 | HR 98 | Temp 99.0°F | Resp 20

## 2012-04-10 VITALS — BP 139/98 | HR 98 | Temp 98.4°F | Resp 20

## 2012-04-10 DIAGNOSIS — C9001 Multiple myeloma in remission: Secondary | ICD-10-CM

## 2012-04-10 DIAGNOSIS — K5720 Diverticulitis of large intestine with perforation and abscess without bleeding: Secondary | ICD-10-CM

## 2012-04-10 DIAGNOSIS — IMO0002 Reserved for concepts with insufficient information to code with codable children: Secondary | ICD-10-CM

## 2012-04-10 DIAGNOSIS — K592 Neurogenic bowel, not elsewhere classified: Secondary | ICD-10-CM

## 2012-04-10 DIAGNOSIS — E669 Obesity, unspecified: Secondary | ICD-10-CM

## 2012-04-10 DIAGNOSIS — N319 Neuromuscular dysfunction of bladder, unspecified: Secondary | ICD-10-CM

## 2012-04-10 DIAGNOSIS — R1084 Generalized abdominal pain: Secondary | ICD-10-CM

## 2012-04-10 DIAGNOSIS — R42 Dizziness and giddiness: Secondary | ICD-10-CM

## 2012-04-10 DIAGNOSIS — I82409 Acute embolism and thrombosis of unspecified deep veins of unspecified lower extremity: Secondary | ICD-10-CM

## 2012-04-10 DIAGNOSIS — G959 Disease of spinal cord, unspecified: Secondary | ICD-10-CM

## 2012-04-10 LAB — BASIC METABOLIC PANEL (CC13)
BUN: 16 mg/dL (ref 7.0–26.0)
CO2: 21 mEq/L — ABNORMAL LOW (ref 22–29)
Calcium: 9.4 mg/dL (ref 8.4–10.4)
Chloride: 107 mEq/L (ref 98–107)
Creatinine: 0.9 mg/dL (ref 0.7–1.3)
Glucose: 135 mg/dl — ABNORMAL HIGH (ref 70–99)
Potassium: 4.1 mEq/L (ref 3.5–5.1)
Sodium: 140 mEq/L (ref 136–145)

## 2012-04-10 MED ORDER — ZOLEDRONIC ACID 4 MG/5ML IV CONC
4.0000 mg | Freq: Once | INTRAVENOUS | Status: AC
  Administered 2012-04-10: 4 mg via INTRAVENOUS
  Filled 2012-04-10: qty 5

## 2012-04-10 MED ORDER — SODIUM CHLORIDE 0.9 % IV SOLN
Freq: Once | INTRAVENOUS | Status: AC
  Administered 2012-04-10: 14:00:00 via INTRAVENOUS

## 2012-04-10 NOTE — Telephone Encounter (Signed)
Gave patient  Appointment for one month zometa   Two months lab and md appointment   Patient aware of all appointments

## 2012-04-10 NOTE — Progress Notes (Signed)
Hematology and Oncology Follow Up Visit  Tyner Codner [id number] 1956/03/14 56 y.o. 04/10/2012    HPI: This is a 56 year old [name] with IgG multiple myeloma diagnosed after presenting with progressive back pain in December 2011 with subsequent hospitalization including MRI of the T-spine which revealed 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 He underwent surgical decompression by Dr. Venetia Maxon. Radiation was completed on 08/22/2010.  Now on single agent monthly is Zometa after discontinuation of dexamethasone/Revlimid therapy.  2. 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 He underwent IVC filter placement and is on Coumadin, being monitored through [facility].   3. 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  Interim History:   Mr. Domangue denies any specific complaints, specifically no fevers chills shortness of breath or chest pain. His ostomy is functioning well. He is feeling more comfortable with managing it. He continues to be followed closely by Dr. Dorothe Pea. He denies any bleeding or bruising symptoms. He denies any neuropathy symptoms of his upper extremities.  A detailed review of systems is otherwise noncontributory as noted below.  Review of Systems: Constitutional:  no weight loss, fever, night sweats and feels well Eyes: No complaints ENT: No complaints Cardiovascular: no chest pain or dyspnea on exertion Respiratory: no cough, shortness of breath, or wheezing Neurological: negative Dermatological: Healed bilateral skin ulcers in posterior foot region. Gastrointestinal: Known ostomy Genito-Urinary: Known foley Hematological and Lymphatic: negative Breast: negative Musculoskeletal:  negative Remaining ROS negative.   Medications:   I have reviewed the patient's current medications.  Current Outpatient Prescriptions  Medication Sig Dispense Refill  . acetaminophen (TYLENOL) 325 MG tablet Take 325-650 mg by mouth every 6 (six) hours as needed. For pain.       Marland Kitchen LORazepam (ATIVAN) 0.5 MG tablet Take 0.25-0.5 mg by mouth every 6 (six) hours as needed. For anxiety.       . methocarbamol (ROBAXIN) 500 MG tablet Take 500 mg by mouth every 6 (six) hours as needed. For muscle spasms.      Marland Kitchen oxyCODONE (OXY IR/ROXICODONE) 5 MG immediate release tablet Take 1 tablet (5 mg total) by mouth every 4 (four) hours as needed for pain. For pain.  30 tablet  0    Allergies: No Known Allergies  Physical Exam: Filed Vitals:   04/10/12 1322  BP: 128/87  Pulse: 98  Temp: 99 F (37.2 C)  Resp: 20   HEENT:  Sclerae anicteric, conjunctivae pink.  Oropharynx clear.  No mucositis or candidiasis.     Lungs:  Clear to auscultation bilaterally.  No crackles, rhonchi, or wheezes.   Heart:  Regular rate and rhythm.     Musculoskeletal:  Patient is wheelchair-bound due to paraplegia.  Neuro:  Unchanged, alert and oriented x 3.   Lab Results:  No results found for this basename: PROTEIN24HR   Lab Results  Component Value Date   WBC 18.6* 02/13/2012   HGB 11.4* 02/13/2012   HCT 32.8* 02/13/2012   MCV 72.1* 02/13/2012   PLT 449* 02/13/2012   NEUTROABS 15.6* 02/13/2012     Chemistry      Component Value Date/Time   NA 139 02/13/2012 1029   K 3.8 02/13/2012 1029   CL 104 02/13/2012 1029   CO2 22 02/13/2012 1029   BUN 16 02/13/2012  1029   CREATININE 0.88 02/13/2012 1029   GLU 166* 09/19/2010 1538      Component Value Date/Time   CALCIUM 9.8 02/13/2012 1029   ALKPHOS 117 02/13/2012 1029   AST 25 02/13/2012 1029   ALT 52 02/13/2012 1029   BILITOT 0.3 02/13/2012 1029        Assessment:  This is a 56 year old [name] with IgG multiple myeloma diagnosed after presenting with  progressive back pain in December 2011 with subsequent hospitalization including MRI of the T-spine which revealed 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 He underwent surgical decompression by Dr. Venetia Maxon. Radiation was completed on 08/22/2010.  Now on single agent monthly is Zometa after discontinuation of dexamethasone/Revlimid therapy. Most recent protein chemistries look like myeloma is in remission.  2. 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 He underwent IVC filter placement and is on Coumadin, being monitored through [facility].   3. 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     Plan:  Mr. Earnest will receive Zometa today as scheduled.  We will continue to monitor him every 2  months with appropriate protein studies, and followup exam prior to Zometa dosing. He will continue to be monitored through [facility], in regards to his PT/INR.  This plan was reviewed with the patient, who voices understanding and agreement.  She knows to call with any changes or problems.    Pierce Crane, MD 04/10/2012'

## 2012-04-10 NOTE — Patient Instructions (Addendum)
We will continue Zometa monthly and I will see her in 2 months with repeat labs

## 2012-05-08 ENCOUNTER — Other Ambulatory Visit: Payer: Self-pay | Admitting: *Deleted

## 2012-05-08 ENCOUNTER — Telehealth: Payer: Self-pay | Admitting: *Deleted

## 2012-05-08 ENCOUNTER — Ambulatory Visit (HOSPITAL_BASED_OUTPATIENT_CLINIC_OR_DEPARTMENT_OTHER): Payer: No Typology Code available for payment source

## 2012-05-08 ENCOUNTER — Other Ambulatory Visit (HOSPITAL_BASED_OUTPATIENT_CLINIC_OR_DEPARTMENT_OTHER): Payer: No Typology Code available for payment source | Admitting: Lab

## 2012-05-08 VITALS — BP 122/85 | HR 84 | Temp 98.7°F | Resp 20

## 2012-05-08 DIAGNOSIS — C9001 Multiple myeloma in remission: Secondary | ICD-10-CM

## 2012-05-08 DIAGNOSIS — K5720 Diverticulitis of large intestine with perforation and abscess without bleeding: Secondary | ICD-10-CM

## 2012-05-08 DIAGNOSIS — E669 Obesity, unspecified: Secondary | ICD-10-CM

## 2012-05-08 DIAGNOSIS — R1084 Generalized abdominal pain: Secondary | ICD-10-CM

## 2012-05-08 DIAGNOSIS — N319 Neuromuscular dysfunction of bladder, unspecified: Secondary | ICD-10-CM

## 2012-05-08 DIAGNOSIS — K592 Neurogenic bowel, not elsewhere classified: Secondary | ICD-10-CM

## 2012-05-08 DIAGNOSIS — IMO0002 Reserved for concepts with insufficient information to code with codable children: Secondary | ICD-10-CM

## 2012-05-08 DIAGNOSIS — R42 Dizziness and giddiness: Secondary | ICD-10-CM

## 2012-05-08 DIAGNOSIS — G959 Disease of spinal cord, unspecified: Secondary | ICD-10-CM

## 2012-05-08 LAB — BASIC METABOLIC PANEL (CC13)
BUN: 20 mg/dL (ref 7.0–26.0)
CO2: 19 mEq/L — ABNORMAL LOW (ref 22–29)
Calcium: 9.1 mg/dL (ref 8.4–10.4)
Chloride: 108 mEq/L — ABNORMAL HIGH (ref 98–107)
Creatinine: 0.9 mg/dL (ref 0.7–1.3)
Glucose: 161 mg/dl — ABNORMAL HIGH (ref 70–99)
Potassium: 4.3 mEq/L (ref 3.5–5.1)
Sodium: 139 mEq/L (ref 136–145)

## 2012-05-08 MED ORDER — ZOLEDRONIC ACID 4 MG/5ML IV CONC
4.0000 mg | Freq: Once | INTRAVENOUS | Status: AC
  Administered 2012-05-08: 4 mg via INTRAVENOUS
  Filled 2012-05-08: qty 5

## 2012-05-08 MED ORDER — SODIUM CHLORIDE 0.9 % IV SOLN
Freq: Once | INTRAVENOUS | Status: AC
  Administered 2012-05-08: 14:00:00 via INTRAVENOUS

## 2012-05-08 NOTE — Patient Instructions (Addendum)
[facility] Discharge Instructions for Patients Receiving Chemotherapy  Today you received the following chemotherapy agents zometa  To help prevent nausea and vomiting after your treatment, we encourage you to take your nausea medication  and take it as often as prescribed   If you develop nausea and vomiting that is not controlled by your nausea medication, call the clinic. If it is after clinic hours your family physician or the after hours number for the clinic or go to the Emergency Department.   BELOW ARE SYMPTOMS THAT SHOULD BE REPORTED IMMEDIATELY:  *FEVER GREATER THAN 100.5 F  *CHILLS WITH OR WITHOUT FEVER  NAUSEA AND VOMITING THAT IS NOT CONTROLLED WITH YOUR NAUSEA MEDICATION  *UNUSUAL SHORTNESS OF BREATH  *UNUSUAL BRUISING OR BLEEDING  TENDERNESS IN MOUTH AND THROAT WITH OR WITHOUT PRESENCE OF ULCERS  *URINARY PROBLEMS  *BOWEL PROBLEMS  UNUSUAL RASH Items with * indicate a potential emergency and should be followed up as soon as possible.  One of the nurses will contact you 24 hours after your treatment. Please let the nurse know about any problems that you may have experienced. Feel free to call the clinic you have any questions or concerns. The clinic phone number is [phone].   I have been informed and understand all the instructions given to me. I know to contact the clinic, my physician, or go to the Emergency Department if any problems should occur. I do not have any questions at this time, but understand that I may call the clinic during office hours   should I have any questions or need assistance in obtaining follow up care.    __________________________________________  _____________  __________ Signature of Patient or Authorized Representative            Date                   Time    __________________________________________ Nurse's Signature    

## 2012-05-08 NOTE — Telephone Encounter (Signed)
Sent michelle email to set up patient's treatment on 06-11-2012

## 2012-05-08 NOTE — Telephone Encounter (Signed)
Per staff message and POF I have scheduled apapt. JWM

## 2012-06-11 ENCOUNTER — Ambulatory Visit (HOSPITAL_BASED_OUTPATIENT_CLINIC_OR_DEPARTMENT_OTHER): Payer: No Typology Code available for payment source

## 2012-06-11 ENCOUNTER — Other Ambulatory Visit (HOSPITAL_BASED_OUTPATIENT_CLINIC_OR_DEPARTMENT_OTHER): Payer: No Typology Code available for payment source

## 2012-06-11 ENCOUNTER — Telehealth: Payer: Self-pay | Admitting: Oncology

## 2012-06-11 ENCOUNTER — Ambulatory Visit (HOSPITAL_BASED_OUTPATIENT_CLINIC_OR_DEPARTMENT_OTHER): Payer: No Typology Code available for payment source | Admitting: Oncology

## 2012-06-11 VITALS — BP 114/80 | HR 97 | Temp 98.3°F | Resp 20 | Ht 68.5 in

## 2012-06-11 DIAGNOSIS — C9001 Multiple myeloma in remission: Secondary | ICD-10-CM

## 2012-06-11 DIAGNOSIS — E669 Obesity, unspecified: Secondary | ICD-10-CM

## 2012-06-11 DIAGNOSIS — C9000 Multiple myeloma not having achieved remission: Secondary | ICD-10-CM

## 2012-06-11 DIAGNOSIS — G959 Disease of spinal cord, unspecified: Secondary | ICD-10-CM

## 2012-06-11 DIAGNOSIS — R1084 Generalized abdominal pain: Secondary | ICD-10-CM

## 2012-06-11 DIAGNOSIS — R42 Dizziness and giddiness: Secondary | ICD-10-CM

## 2012-06-11 DIAGNOSIS — N319 Neuromuscular dysfunction of bladder, unspecified: Secondary | ICD-10-CM

## 2012-06-11 DIAGNOSIS — K5720 Diverticulitis of large intestine with perforation and abscess without bleeding: Secondary | ICD-10-CM

## 2012-06-11 DIAGNOSIS — K592 Neurogenic bowel, not elsewhere classified: Secondary | ICD-10-CM

## 2012-06-11 DIAGNOSIS — I82409 Acute embolism and thrombosis of unspecified deep veins of unspecified lower extremity: Secondary | ICD-10-CM

## 2012-06-11 DIAGNOSIS — IMO0002 Reserved for concepts with insufficient information to code with codable children: Secondary | ICD-10-CM

## 2012-06-11 LAB — COMPREHENSIVE METABOLIC PANEL (CC13)
ALT: 41 U/L (ref 0–55)
AST: 14 U/L (ref 5–34)
Albumin: 3.1 g/dL — ABNORMAL LOW (ref 3.5–5.0)
Alkaline Phosphatase: 99 U/L (ref 40–150)
BUN: 16 mg/dL (ref 7.0–26.0)
CO2: 24 mEq/L (ref 22–29)
Calcium: 9.5 mg/dL (ref 8.4–10.4)
Chloride: 103 mEq/L (ref 98–107)
Creatinine: 1 mg/dL (ref 0.7–1.3)
Glucose: 123 mg/dl — ABNORMAL HIGH (ref 70–99)
Potassium: 4.2 mEq/L (ref 3.5–5.1)
Sodium: 138 mEq/L (ref 136–145)
Total Bilirubin: 0.25 mg/dL (ref 0.20–1.20)
Total Protein: 7.2 g/dL (ref 6.4–8.3)

## 2012-06-11 LAB — CBC WITH DIFFERENTIAL/PLATELET
BASO%: 0.1 % (ref 0.0–2.0)
Basophils Absolute: 0 10*3/uL (ref 0.0–0.1)
EOS%: 0.9 % (ref 0.0–7.0)
Eosinophils Absolute: 0.2 10*3/uL (ref 0.0–0.5)
HCT: 35.9 % — ABNORMAL LOW (ref 38.4–49.9)
HGB: 12.1 g/dL — ABNORMAL LOW (ref 13.0–17.1)
LYMPH%: 8.2 % — ABNORMAL LOW (ref 14.0–49.0)
MCH: 24.3 pg — ABNORMAL LOW (ref 27.2–33.4)
MCHC: 33.7 g/dL (ref 32.0–36.0)
MCV: 72.2 fL — ABNORMAL LOW (ref 79.3–98.0)
MONO#: 1.1 10*3/uL — ABNORMAL HIGH (ref 0.1–0.9)
MONO%: 5.3 % (ref 0.0–14.0)
NEUT#: 17.3 10*3/uL — ABNORMAL HIGH (ref 1.5–6.5)
NEUT%: 85.5 % — ABNORMAL HIGH (ref 39.0–75.0)
Platelets: 387 10*3/uL (ref 140–400)
RBC: 4.97 10*6/uL (ref 4.20–5.82)
RDW: 18.5 % — ABNORMAL HIGH (ref 11.0–14.6)
WBC: 20.3 10*3/uL — ABNORMAL HIGH (ref 4.0–10.3)
lymph#: 1.7 10*3/uL (ref 0.9–3.3)
nRBC: 0 % (ref 0–0)

## 2012-06-11 MED ORDER — SODIUM CHLORIDE 0.9 % IV SOLN
Freq: Once | INTRAVENOUS | Status: AC
  Administered 2012-06-11: 16:00:00 via INTRAVENOUS

## 2012-06-11 MED ORDER — ZOLEDRONIC ACID 4 MG/5ML IV CONC
4.0000 mg | Freq: Once | INTRAVENOUS | Status: AC
  Administered 2012-06-11: 4 mg via INTRAVENOUS
  Filled 2012-06-11: qty 5

## 2012-06-11 NOTE — Patient Instructions (Addendum)
[facility] Discharge Instructions for Patients Receiving Chemotherapy  Today you received the following chemotherapy agents zometa  To help prevent nausea and vomiting after your treatment, we encourage you to take your nausea medication  and take it as often as prescribed   If you develop nausea and vomiting that is not controlled by your nausea medication, call the clinic. If it is after clinic hours your family physician or the after hours number for the clinic or go to the Emergency Department.   BELOW ARE SYMPTOMS THAT SHOULD BE REPORTED IMMEDIATELY:  *FEVER GREATER THAN 100.5 F  *CHILLS WITH OR WITHOUT FEVER  NAUSEA AND VOMITING THAT IS NOT CONTROLLED WITH YOUR NAUSEA MEDICATION  *UNUSUAL SHORTNESS OF BREATH  *UNUSUAL BRUISING OR BLEEDING  TENDERNESS IN MOUTH AND THROAT WITH OR WITHOUT PRESENCE OF ULCERS  *URINARY PROBLEMS  *BOWEL PROBLEMS  UNUSUAL RASH Items with * indicate a potential emergency and should be followed up as soon as possible.  One of the nurses will contact you 24 hours after your treatment. Please let the nurse know about any problems that you may have experienced. Feel free to call the clinic you have any questions or concerns. The clinic phone number is [phone].   I have been informed and understand all the instructions given to me. I know to contact the clinic, my physician, or go to the Emergency Department if any problems should occur. I do not have any questions at this time, but understand that I may call the clinic during office hours   should I have any questions or need assistance in obtaining follow up care.    __________________________________________  _____________  __________ Signature of Patient or Authorized Representative            Date                   Time    __________________________________________ Nurse's Signature    

## 2012-06-11 NOTE — Progress Notes (Signed)
Hematology and Oncology Follow Up Visit  [name] [id number] 03-06-1956 56 y.o. 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 He underwent surgical decompression by Dr. Venetia Maxon. Radiation was completed on 08/22/2010.  Now on single agent monthly is Zometa after discontinuation of dexamethasone/Revlimid therapy.  2. 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 He underwent IVC filter placement and is on Coumadin, being monitored through [facility].   3. 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 06/2011  Interim History:   [name] denies any specific complaints, specifically no fevers chills shortness of breath or chest pain. His ostomy is functioning well. He is feeling more comfortable with managing it. He continues to be followed closely by Dr. Dorothe Pea. He denies any bleeding or bruising symptoms. He denies any neuropathy symptoms of his upper extremities. He is doing well at [facility], where he goes [place]. His weight is up . He is better at dealing with his colostomy and foley. A detailed review of systems is otherwise noncontributory as noted below.  Review of Systems: Constitutional:  no weight loss, fever, night sweats and feels well Eyes: No complaints ENT: No complaints Cardiovascular: no chest pain or dyspnea on exertion Respiratory: no cough, shortness of breath, or wheezing Neurological: negative Dermatological: Healed bilateral skin ulcers in posterior foot  region. Gastrointestinal: Known ostomy Genito-Urinary: Known foley Hematological and Lymphatic: negative Breast: negative Musculoskeletal: negative Remaining ROS negative.   Medications:   I have reviewed the patient's current medications.  Current Outpatient Prescriptions  Medication Sig Dispense Refill  . acetaminophen (TYLENOL) 325 MG tablet Take 325-650 mg by mouth every 6 (six) hours as needed. For pain.       Marland Kitchen LORazepam (ATIVAN) 0.5 MG tablet Take 0.25-0.5 mg by mouth every 6 (six) hours as needed. For anxiety.       . methocarbamol (ROBAXIN) 500 MG tablet Take 500 mg by mouth every 6 (six) hours as needed. For muscle spasms.      Marland Kitchen oxyCODONE (OXY IR/ROXICODONE) 5 MG immediate release tablet Take 1 tablet (5 mg total) by mouth every 4 (four) hours as needed for pain. For pain.  30 tablet  0    Allergies: No Known Allergies  Physical Exam: Filed Vitals:   06/11/12 1403  BP: 114/80  Pulse: 97  Temp: 98.3 F (36.8 C)  Resp: 20   HEENT:  Sclerae anicteric, conjunctivae pink.  Oropharynx clear.  No mucositis or candidiasis.     Lungs:  Clear to auscultation bilaterally.  No crackles, rhonchi, or wheezes.   Heart:  Regular rate and rhythm.     Musculoskeletal:  Patient is wheelchair-bound due to paraplegia.  Neuro:  Unchanged, alert and oriented x 3.   Lab Results:  No results found for this basename: PROTEIN24HR   Lab Results  Component Value Date   WBC 20.3* 06/11/2012   HGB 12.1* 06/11/2012   HCT 35.9* 06/11/2012   MCV 72.2* 06/11/2012   PLT 387 06/11/2012   NEUTROABS 17.3* 06/11/2012     Chemistry      Component Value Date/Time  NA 139 05/08/2012 1343   NA 139 02/13/2012 1029   K 4.3 05/08/2012 1343   K 3.8 02/13/2012 1029   CL 108* 05/08/2012 1343   CL 104 02/13/2012 1029   CO2 19* 05/08/2012 1343   CO2 22 02/13/2012 1029   BUN 20.0 05/08/2012 1343   BUN 16 02/13/2012 1029   CREATININE 0.9 05/08/2012 1343   CREATININE 0.88 02/13/2012 1029   GLU 166*  09/19/2010 1538      Component Value Date/Time   CALCIUM 9.1 05/08/2012 1343   CALCIUM 9.8 02/13/2012 1029   ALKPHOS 117 02/13/2012 1029   AST 25 02/13/2012 1029   ALT 52 02/13/2012 1029   BILITOT 0.3 02/13/2012 1029        Assessment:  This is a 56 year old [name] with IgG multiple myeloma diagnosed after presenting with progressive back pain in December 2011 with subsequent hospitalization including MRI of the T-spine which revealed 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 He underwent surgical decompression by Dr. Venetia Maxon. Radiation was completed on 08/22/2010.  Now on single agent monthly is Zometa after discontinuation of dexamethasone/Revlimid therapy. Most recent protein chemistries look like myeloma is in remission.  2. 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 He underwent IVC filter placement and is on Coumadin, being monitored through [facility].   3. 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     Plan:  [name] will receive Zometa today as scheduled.  We will continue to monitor him every 3  months with appropriate protein studies, and followup exam prior to Zometa dosing. He will continue to be monitored through [facility], in regards to his PT/INR. His wbc remains persistently elevated with lt shift, likely on the basis of his underlying condition(s).  This plan was reviewed with the patient, who voices understanding and agreement.  She knows to call with any changes or problems.    Pierce Crane, MD 06/11/2012'

## 2012-06-11 NOTE — Telephone Encounter (Signed)
gve the pt his feb 2014 appt calendar. Pt is aware his zometa appts will be added. Sent michelle a staff message to add the tx's.

## 2012-06-12 ENCOUNTER — Telehealth: Payer: Self-pay | Admitting: *Deleted

## 2012-06-12 ENCOUNTER — Telehealth: Payer: Self-pay | Admitting: Oncology

## 2012-06-12 NOTE — Telephone Encounter (Signed)
lmonvm adviisng the pt of his next appt in dec for the zometa tx and to pick up the schedules at that time.

## 2012-06-12 NOTE — Telephone Encounter (Signed)
Per staff message I have adjusted appts. JM W 

## 2012-06-14 LAB — PROTEIN ELECTROPHORESIS, SERUM, WITH REFLEX
Albumin ELP: 48.9 % — ABNORMAL LOW (ref 55.8–66.1)
Alpha-1-Globulin: 6.7 % — ABNORMAL HIGH (ref 2.9–4.9)
Alpha-2-Globulin: 16.1 % — ABNORMAL HIGH (ref 7.1–11.8)
Beta 2: 6.6 % — ABNORMAL HIGH (ref 3.2–6.5)
Beta Globulin: 6.3 % (ref 4.7–7.2)
Gamma Globulin: 15.4 % (ref 11.1–18.8)
M-Spike, %: 0.41 g/dL
Total Protein, Serum Electrophoresis: 7.9 g/dL (ref 6.0–8.3)

## 2012-06-14 LAB — IFE INTERPRETATION

## 2012-06-14 LAB — IGG, IGA, IGM
IgA: 171 mg/dL (ref 68–379)
IgG (Immunoglobin G), Serum: 1270 mg/dL (ref 650–1600)
IgM, Serum: 72 mg/dL (ref 41–251)

## 2012-06-14 LAB — KAPPA/LAMBDA LIGHT CHAINS
Kappa free light chain: 0.81 mg/dL (ref 0.33–1.94)
Kappa:Lambda Ratio: 0.98 (ref 0.26–1.65)
Lambda Free Lght Chn: 0.83 mg/dL (ref 0.57–2.63)

## 2012-06-24 ENCOUNTER — Other Ambulatory Visit: Payer: Self-pay | Admitting: Family Medicine

## 2012-06-24 ENCOUNTER — Ambulatory Visit
Admission: RE | Admit: 2012-06-24 | Discharge: 2012-06-24 | Disposition: A | Discharge status: Home / Self Care | Payer: No Typology Code available for payment source | Source: Ambulatory Visit | Attending: Family Medicine | Admitting: Family Medicine

## 2012-06-24 DIAGNOSIS — K5792 Diverticulitis of intestine, part unspecified, without perforation or abscess without bleeding: Secondary | ICD-10-CM

## 2012-06-24 DIAGNOSIS — R109 Unspecified abdominal pain: Secondary | ICD-10-CM

## 2012-06-24 MED ORDER — IOHEXOL 300 MG/ML  SOLN
125.0000 mL | Freq: Once | INTRAMUSCULAR | Status: AC | PRN
  Administered 2012-06-24: 125 mL via INTRAVENOUS

## 2012-06-24 MED ORDER — IOHEXOL 300 MG/ML  SOLN
30.0000 mL | Freq: Once | INTRAMUSCULAR | Status: AC | PRN
  Administered 2012-06-24: 30 mL via ORAL

## 2012-06-25 ENCOUNTER — Inpatient Hospital Stay: Admission: RE | Admit: 2012-06-25 | Source: Ambulatory Visit

## 2012-07-09 ENCOUNTER — Ambulatory Visit: Payer: No Typology Code available for payment source

## 2012-08-06 ENCOUNTER — Telehealth: Payer: Self-pay | Admitting: Medical Oncology

## 2012-08-06 ENCOUNTER — Ambulatory Visit: Payer: No Typology Code available for payment source

## 2012-08-06 NOTE — Telephone Encounter (Signed)
Pt did not show up for 1415  Treatment. I called his home phone but did not get an answer.

## 2012-08-22 NOTE — Progress Notes (Signed)
Hematology and Oncology Follow Up Visit  [name] [id number] 1956/04/17 57 y.o. 08/29/11   HPI: This is a 57 year old [name] with IgG multiple myeloma diagnosed after presenting with progressive back pain in December 2011 with subsequent hospitalization including MRI of the T-spine which revealed 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 He underwent surgical decompression by Dr. Venetia Maxon. Radiation was completed on 08/22/2010.  Now on single agent monthly is Zometa after discontinuation of dexamethasone/Revlimid therapy. 2. 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 He underwent IVC filter placement and is on Coumadin, being monitored through [facility].  3. 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  Interim History:   [name] denies any specific complaints, specifically no fevers chills shortness of breath or chest pain. His ostomy is functioning well. He is feeling more comfortable with managing it. He continues to be followed closely by Dr. Dorothe Pea. He denies any bleeding or bruising symptoms. He denies any neuropathy symptoms of his upper extremities.  He is quite pleased with the fact that he has now been released from the wound care center, noting the lesions on his left and right foot have completely healed. A detailed review of systems is otherwise noncontributory as noted below.  Review of Systems: Constitutional:  no weight loss, fever, night sweats and feels well Eyes: No complaints ENT: No complaints Cardiovascular: no chest pain or dyspnea on exertion Respiratory: no cough, shortness of breath, or wheezing Neurological: negative Dermatological: Healed bilateral skin ulcers in posterior foot  region. Gastrointestinal: Known ostomy Genito-Urinary: Known foley Hematological and Lymphatic: negative Breast: negative Musculoskeletal: negative Remaining ROS negative.   Medications:   I have reviewed the patient's current medications.  Current Outpatient Prescriptions  Medication Sig Dispense Refill  . LORazepam (ATIVAN) 0.5 MG tablet Take 0.25-0.5 mg by mouth every 6 (six) hours as needed. For anxiety.       . methocarbamol (ROBAXIN) 500 MG tablet Take 500 mg by mouth every 6 (six) hours as needed. For muscle spasms.      Marland Kitchen oxyCODONE (OXY IR/ROXICODONE) 5 MG immediate release tablet Take 1 tablet (5 mg total) by mouth every 4 (four) hours as needed for pain. For pain.  30 tablet  0  . acetaminophen (TYLENOL) 325 MG tablet Take 325-650 mg by mouth every 6 (six) hours as needed. For pain.         Allergies: No Known Allergies  Physical Exam: Filed Vitals:   08/29/11 1035  BP: 130/91  Pulse: 80  Temp: 98.3 F (36.8 C)   HEENT:  Sclerae anicteric, conjunctivae pink.  Oropharynx clear.  No mucositis or candidiasis.     Lungs:  Clear to auscultation bilaterally.  No crackles, rhonchi, or wheezes.   Heart:  Regular rate and rhythm.     Musculoskeletal:  Patient is wheelchair-bound due to paraplegia. Lower extremities of sheepskin lined supports in place. Neuro:  Unchanged, alert and oriented x 3.   Lab Results: Lab Results  Component Value Date   WBC 20.3* 06/11/2012   HGB 12.1* 06/11/2012   HCT 35.9* 06/11/2012   MCV 72.2* 06/11/2012   PLT 387 06/11/2012   NEUTROABS 17.3* 06/11/2012     Chemistry      Component Value Date/Time   NA 138 06/11/2012 1338  NA 139 02/13/2012 1029   K 4.2 06/11/2012 1338   K 3.8 02/13/2012 1029   CL 103 06/11/2012 1338   CL 104 02/13/2012 1029   CO2 24 06/11/2012 1338   CO2 22 02/13/2012 1029   BUN 16.0 06/11/2012 1338   BUN 16 02/13/2012 1029   CREATININE 1.0 06/11/2012 1338   CREATININE 0.88 02/13/2012 1029   GLU 166* 09/19/2010  1538      Component Value Date/Time   CALCIUM 9.5 06/11/2012 1338   CALCIUM 9.8 02/13/2012 1029   ALKPHOS 99 06/11/2012 1338   ALKPHOS 117 02/13/2012 1029   AST 14 06/11/2012 1338   AST 25 02/13/2012 1029   ALT 41 06/11/2012 1338   ALT 52 02/13/2012 1029   BILITOT 0.25 06/11/2012 1338   BILITOT 0.3 02/13/2012 1029        Assessment:  This is a 57 year old [name] with IgG multiple myeloma diagnosed after presenting with progressive back pain in December 2011 with subsequent hospitalization including MRI of the T-spine which revealed 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 He underwent surgical decompression by Dr. Venetia Maxon. Radiation was completed on 08/22/2010.  Now on single agent monthly is Zometa after discontinuation of dexamethasone/Revlimid therapy. 2. 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 He underwent IVC filter placement and is on Coumadin, being monitored through [facility].  3. 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  Case reviewed with Dr. Pierce Crane.   Plan:  [name] will receive Zometa today as scheduled.  We will continue to monitor him monthly with appropriate protein studies, and followup exam prior to Zometa dosing. He will continue to be monitored through [facility], in regards to his PT/INR.  This plan was reviewed with the patient, who voices understanding and agreement.  She knows to call with any changes or problems.    Kimbrely Buckel, md

## 2012-09-03 ENCOUNTER — Ambulatory Visit: Payer: No Typology Code available for payment source

## 2012-09-04 ENCOUNTER — Other Ambulatory Visit: Payer: Self-pay | Admitting: Oncology

## 2012-09-04 ENCOUNTER — Telehealth: Payer: Self-pay | Admitting: Oncology

## 2012-09-04 ENCOUNTER — Other Ambulatory Visit: Payer: Self-pay

## 2012-09-04 DIAGNOSIS — C9001 Multiple myeloma in remission: Secondary | ICD-10-CM

## 2012-09-04 NOTE — Telephone Encounter (Signed)
Dr. Dorothe Pea called to speak to the MD assuming this patient's care.   This patient has been assigned to Dr. Arline Asp by Jule Ser however he has not been scheduled yet.  I talked to Dory Peru who stated she will connect with Dr. Arline Asp about getting this patient scheduled.   I talked with Dr. Arline Asp and gave him Dr. Malon Kindle phone numbers and asked him to contact Dr. Dorothe Pea and Dr. Arline Asp agreed.

## 2012-09-05 ENCOUNTER — Telehealth: Payer: Self-pay | Admitting: Oncology

## 2012-09-05 NOTE — Telephone Encounter (Signed)
S/w pt wife re appts for 2/19 and 2/26 former PR to DM

## 2012-09-11 ENCOUNTER — Other Ambulatory Visit: Payer: Self-pay | Admitting: Medical Oncology

## 2012-09-11 ENCOUNTER — Other Ambulatory Visit: Payer: Medicaid Other | Admitting: Lab

## 2012-09-11 DIAGNOSIS — C9001 Multiple myeloma in remission: Secondary | ICD-10-CM

## 2012-09-11 LAB — CBC WITH DIFFERENTIAL/PLATELET
BASO%: 0.1 % (ref 0.0–2.0)
Basophils Absolute: 0 10*3/uL (ref 0.0–0.1)
EOS%: 1.4 % (ref 0.0–7.0)
Eosinophils Absolute: 0.2 10*3/uL (ref 0.0–0.5)
HCT: 28.3 % — ABNORMAL LOW (ref 38.4–49.9)
HGB: 9.3 g/dL — ABNORMAL LOW (ref 13.0–17.1)
LYMPH%: 8.4 % — ABNORMAL LOW (ref 14.0–49.0)
MCH: 22.6 pg — ABNORMAL LOW (ref 27.2–33.4)
MCHC: 32.9 g/dL (ref 32.0–36.0)
MCV: 68.7 fL — ABNORMAL LOW (ref 79.3–98.0)
MONO#: 0.8 10*3/uL (ref 0.1–0.9)
MONO%: 5.2 % (ref 0.0–14.0)
NEUT#: 13.8 10*3/uL — ABNORMAL HIGH (ref 1.5–6.5)
NEUT%: 84.9 % — ABNORMAL HIGH (ref 39.0–75.0)
Platelets: 506 10*3/uL — ABNORMAL HIGH (ref 140–400)
RBC: 4.12 10*6/uL — ABNORMAL LOW (ref 4.20–5.82)
RDW: 17.7 % — ABNORMAL HIGH (ref 11.0–14.6)
WBC: 16.2 10*3/uL — ABNORMAL HIGH (ref 4.0–10.3)
lymph#: 1.4 10*3/uL (ref 0.9–3.3)

## 2012-09-11 LAB — COMPREHENSIVE METABOLIC PANEL (CC13)
ALT: 18 U/L (ref 0–55)
AST: 9 U/L (ref 5–34)
Albumin: 2.6 g/dL — ABNORMAL LOW (ref 3.5–5.0)
Alkaline Phosphatase: 76 U/L (ref 40–150)
BUN: 9.1 mg/dL (ref 7.0–26.0)
CO2: 25 mEq/L (ref 22–29)
Calcium: 9.1 mg/dL (ref 8.4–10.4)
Chloride: 106 mEq/L (ref 98–107)
Creatinine: 0.7 mg/dL (ref 0.7–1.3)
Glucose: 104 mg/dl — ABNORMAL HIGH (ref 70–99)
Potassium: 3.5 mEq/L (ref 3.5–5.1)
Sodium: 142 mEq/L (ref 136–145)
Total Bilirubin: 0.33 mg/dL (ref 0.20–1.20)
Total Protein: 6.9 g/dL (ref 6.4–8.3)

## 2012-09-11 LAB — PROTEIN / CREATININE RATIO, URINE
Creatinine, Urine: 132.2 mg/dL
Protein Creatinine Ratio: 0.28 — ABNORMAL HIGH (ref <0.15)
Total Protein, Urine: 37 mg/dL

## 2012-09-11 LAB — LACTATE DEHYDROGENASE (CC13): LDH: 155 U/L (ref 125–245)

## 2012-09-12 ENCOUNTER — Ambulatory Visit: Payer: No Typology Code available for payment source | Admitting: Oncology

## 2012-09-12 ENCOUNTER — Other Ambulatory Visit: Payer: No Typology Code available for payment source | Admitting: Lab

## 2012-09-13 LAB — IMMUNOFIXATION ELECTROPHORESIS
IgA: 136 mg/dL (ref 68–379)
IgG (Immunoglobin G), Serum: 904 mg/dL (ref 650–1600)
IgM, Serum: 49 mg/dL (ref 41–251)
Total Protein, Serum Electrophoresis: 6.4 g/dL (ref 6.0–8.3)

## 2012-09-13 LAB — VITAMIN D 25 HYDROXY (VIT D DEFICIENCY, FRACTURES): Vit D, 25-Hydroxy: 13 ng/mL — ABNORMAL LOW (ref 30–89)

## 2012-09-13 LAB — KAPPA/LAMBDA LIGHT CHAINS
Kappa free light chain: 0.97 mg/dL (ref 0.33–1.94)
Kappa:Lambda Ratio: 1.23 (ref 0.26–1.65)
Lambda Free Lght Chn: 0.79 mg/dL (ref 0.57–2.63)

## 2012-09-18 ENCOUNTER — Other Ambulatory Visit: Payer: Self-pay | Admitting: Oncology

## 2012-09-18 ENCOUNTER — Ambulatory Visit (HOSPITAL_BASED_OUTPATIENT_CLINIC_OR_DEPARTMENT_OTHER): Payer: Medicaid Other | Admitting: Lab

## 2012-09-18 ENCOUNTER — Telehealth: Payer: Self-pay | Admitting: Oncology

## 2012-09-18 ENCOUNTER — Telehealth: Payer: Self-pay

## 2012-09-18 ENCOUNTER — Encounter: Payer: Self-pay | Admitting: Oncology

## 2012-09-18 ENCOUNTER — Ambulatory Visit (HOSPITAL_BASED_OUTPATIENT_CLINIC_OR_DEPARTMENT_OTHER): Payer: No Typology Code available for payment source | Admitting: Oncology

## 2012-09-18 VITALS — BP 126/88 | HR 98 | Temp 97.6°F | Resp 20 | Ht 68.5 in

## 2012-09-18 DIAGNOSIS — M87180 Osteonecrosis due to drugs, jaw: Secondary | ICD-10-CM | POA: Insufficient documentation

## 2012-09-18 DIAGNOSIS — C9001 Multiple myeloma in remission: Secondary | ICD-10-CM

## 2012-09-18 DIAGNOSIS — D649 Anemia, unspecified: Secondary | ICD-10-CM

## 2012-09-18 DIAGNOSIS — D509 Iron deficiency anemia, unspecified: Secondary | ICD-10-CM | POA: Insufficient documentation

## 2012-09-18 DIAGNOSIS — M8708 Idiopathic aseptic necrosis of bone, other site: Secondary | ICD-10-CM

## 2012-09-18 DIAGNOSIS — E559 Vitamin D deficiency, unspecified: Secondary | ICD-10-CM

## 2012-09-18 DIAGNOSIS — D638 Anemia in other chronic diseases classified elsewhere: Secondary | ICD-10-CM | POA: Insufficient documentation

## 2012-09-18 LAB — CBC WITH DIFFERENTIAL/PLATELET
BASO%: 0.7 % (ref 0.0–2.0)
Basophils Absolute: 0.1 10*3/uL (ref 0.0–0.1)
EOS%: 0.7 % (ref 0.0–7.0)
Eosinophils Absolute: 0.1 10*3/uL (ref 0.0–0.5)
HCT: 29.6 % — ABNORMAL LOW (ref 38.4–49.9)
HGB: 9.5 g/dL — ABNORMAL LOW (ref 13.0–17.1)
LYMPH%: 13.1 % — ABNORMAL LOW (ref 14.0–49.0)
MCH: 21.9 pg — ABNORMAL LOW (ref 27.2–33.4)
MCHC: 32.1 g/dL (ref 32.0–36.0)
MCV: 68 fL — ABNORMAL LOW (ref 79.3–98.0)
MONO#: 0.5 10*3/uL (ref 0.1–0.9)
MONO%: 2.7 % (ref 0.0–14.0)
NEUT#: 16.8 10*3/uL — ABNORMAL HIGH (ref 1.5–6.5)
NEUT%: 82.8 % — ABNORMAL HIGH (ref 39.0–75.0)
Platelets: 558 10*3/uL — ABNORMAL HIGH (ref 140–400)
RBC: 4.36 10*6/uL (ref 4.20–5.82)
RDW: 19.2 % — ABNORMAL HIGH (ref 11.0–14.6)
WBC: 20.3 10*3/uL — ABNORMAL HIGH (ref 4.0–10.3)
lymph#: 2.7 10*3/uL (ref 0.9–3.3)

## 2012-09-18 LAB — IRON AND TIBC
%SAT: 3 % — ABNORMAL LOW (ref 20–55)
Iron: 11 ug/dL — ABNORMAL LOW (ref 42–165)
TIBC: 330 ug/dL (ref 215–435)
UIBC: 319 ug/dL (ref 125–400)

## 2012-09-18 LAB — FERRITIN: Ferritin: 46 ng/mL (ref 22–322)

## 2012-09-18 NOTE — Telephone Encounter (Signed)
Gave pt appt for April 2014 lab and MD, sent pt to labs today

## 2012-09-18 NOTE — Telephone Encounter (Signed)
S/w Annice Pih at [facility] that Dr Arline Asp would like [facility] to monitor and treat Vit Gertie Gowda agreed they will. Labs faxed to their clinic [phone]

## 2012-09-18 NOTE — Progress Notes (Signed)
This office note has been dictated.  #[id number]

## 2012-09-18 NOTE — Progress Notes (Signed)
Patient has been scheduled for IV Feraheme 510 mg on or about 09/20/2012.  I am also asking that he be given stool cards we can check for occult blood.  Ferritin today was 46 with an iron saturation of 3%, iron 11 and TIBC 330.

## 2012-09-18 NOTE — Patient Instructions (Signed)
Stop bactrim (Septra).  Your vitamin D level is low.  You will need a prescription for Vitamin D.  We are checking for iron deficiency today..  If your iron is low, you will need to be taking iron pills.

## 2012-09-19 ENCOUNTER — Telehealth: Payer: Self-pay | Admitting: *Deleted

## 2012-09-19 NOTE — Telephone Encounter (Signed)
Per POF I have tried to call the home number, the mail box is to full to leave messages. I have called the mobile number and left a message to call me back. Need to schedule iron treatment. JMW

## 2012-09-19 NOTE — Progress Notes (Signed)
CC:   Jethro Bastos, M.D.  PROBLEM LIST: 1. Multiple myeloma, IgG lambda, presenting in December 2011 with back     pain and leg weakness due to a cord compression at T8 which     ultimately resulted in paraplegia.  The patient underwent biopsy     and cord decompression by Dr. Kerri Perches on 07/15/2010.  He     underwent radiation treatments which concluded by 08/22/2010.  The     patient received treatment with Revlimid, Decadron, and Velcade in     combination with Zometa.  His initial IgG levels were as high as     5570 back in February 2012.  Initial 24-hour urine protein came to     318 mg, positive for IgG heavy chains and lambda light chains.  I     do not think the patient ever had a bone marrow or a metastatic     bone survey; however, CT scans of abdomen and pelvis carried out on     12/09/2010 have shown myelomatous involvement of the bones.  A CT     scan of the head on 05/05/2011 was normal.  The patient has had an     outstanding response to treatment.  IgG level from 09/11/2012 was     904.  Serum immunofixation electrophoresis continues to show     monoclonal IgG lambda protein.  It appears that Velcade was     discontinued in September 2012 and that Revlimid and Decadron were     discontinued in March 2013.  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  The patient is presently on no     specific treatment for his IgG lambda multiple myeloma. 2. Paraplegia which developed in December 2011.  The patient is status     post surgical decompression of the T8 paraspinal mass by Dr. Maeola Harman.  He is also status post radiation treatments. 3. Neurogenic bladder and bowel. 4. 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     An inferior vena cava filter had been placed on November 10, 2010.     The patient was started on Xarelto on 06/12/2012. 5.  Placement of an IVC filter on 11/10/2010. 6. History of sigmoid diverticulitis with perforation in December 2012     resulting in a sigmoid colectomy and end-colostomy on 07/20/2011. 7. End-colostomy. 8. History of C. difficile. 9. Adrenal insufficiency. 10. Diabetes mellitus. 11. History of cataracts, status post surgery. 12. Cholelithiasis noted on CT scan of abdomen and pelvis from 06/24/2012. 13. Vitamin D deficiency noted on 09/11/2012 when vitamin D level was     13. 14. Hypoalbuminemia. 15. Anemia noted 09/11/2012 with hemoglobin 9.3, hematocrit 28.3, and     low red cell indices, suspected to be due to iron deficiency.   MEDICATIONS:  Reviewed and recorded. Current Outpatient Prescriptions  Medication Sig Dispense Refill  . acetaminophen (TYLENOL) 325 MG tablet Take 325-650 mg by mouth every 6 (six) hours as needed. For pain.       Marland Kitchen dexamethasone (DECADRON) 0.75 MG tablet Take 0.75 mg by mouth. Two tabs QOD on even days, 1 tab QOD on odd days      . finasteride (PROSCAR) 5 MG tablet Take 5 mg by mouth daily.      Marland Kitchen latanoprost (XALATAN) 0.005 %  ophthalmic solution Place 1 drop into both eyes at bedtime.      . metFORMIN (GLUCOPHAGE) 500 MG tablet Take 500 mg by mouth 2 (two) times daily with a meal.      . pantoprazole (PROTONIX) 40 MG tablet Take 40 mg by mouth daily.      . Rivaroxaban (XARELTO) 20 MG TABS Take 20 mg by mouth daily.      Marland Kitchen sulfamethoxazole-trimethoprim (BACTRIM DS,SEPTRA DS) 800-160 MG per tablet Take 1 tablet by mouth. Every Mon, Wed, Fri      . methocarbamol (ROBAXIN) 500 MG tablet Take 500 mg by mouth every 6 (six) hours as needed. For muscle spasms.      Marland Kitchen oxyCODONE (OXY IR/ROXICODONE) 5 MG immediate release tablet Take 1 tablet (5 mg total) by mouth every 4 (four) hours as needed for pain. For pain.  30 tablet  0   No current facility-administered medications for this visit.   I am recommending that Bactrim be discontinued at this time.  This  was started when the patient was on chemotherapy as prophylaxis four Pneumocystis.   SMOKING HISTORY:  The patient has never smoked cigarettes.    HISTORY:  [name] is a 57 year old African American male whom I am seeing for the first time at the [facility] after the departure of Dr. Pierce Crane who had been taking care this patient since the time of diagnosis of his IgG lambda multiple myeloma in December 2011.  [name] is accompanied by his wife, Annice Pih.  The patient was last seen by Dr. Donnie Coffin on 06/11/2012, at which time he seemed to be doing fairly well.  Unfortunately, since that visit the patient apparently noted a rough area on the lingual side of his right mandible.  He ultimately was seen by Dr. 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  The patient is having Peridex rinses.  I am not sure what other treatments are being utilized.  In the past we have sent patients for hyperbaric oxygen.  The area is asymptomatic and without any pain.  The patient has a very complicated medical history over the past few years as can be noted from the above problem list.  Today's session was extremely lengthy, lasting about 2 hours, as I reviewed the patient's record and EMR and other notes from Dr. Donnie Coffin, his PA, as well as notes from Dr. Dorothe Pea.  The patient is followed by Dr. Dorothe Pea through the [facility]. In general, he seems to be getting along fairly well.  He lives with his wife and 79 year old twins.  [place] [address] [place] [address].  He is wheelchair-confined.  As stated, he is no longer on any specific treatment for his multiple myeloma which has had a wonderful response, although the serum immunofixation is still positive for monoclonal protein.  The patient does have some back discomfort, but does not appear to be on any specific pain medicine.  Apparently, at one time he did have oxycodone, but  does not appear to be taking that currently.  He is on Decadron 1.5 mg every other day, apparently for adrenal insufficiency.  He is on metformin 500 mg twice daily, apparently for some borderline diabetes.  I do see a glucose of 161 on 05/08/2012.  The patient is on Xarelto 20 mg daily.  This apparently was started on 06/12/2012.  Prior to that, the patient had been on Coumadin  The patient  is without any major complaints today.  There is some concern about some pus around his right great toe.  He has not had any fever.  As stated, he does have a colostomy in the left lower quadrant. He also has an indwelling Foley via the penis.  PHYSICAL EXAMINATION:  General:  The patient is very pleasant, alert, in no acute distress.  He is in a wheelchair.  Weight is estimated to be approximately 250 pounds, height 5 feet 8-1/2 inches.  Vital Signs: Blood pressure 126/88.  Other vital signs are normal.  He is afebrile. HEENT:  There is no scleral icterus.  Pupillary and extraocular movements are normal.  Mouth and pharynx - dentition is fair.  There is exposed bone measuring perhaps 7 or 8 mm just posterior to the patient's last molar on the right, involving the mandible.  This area looks clean, does not appear to be infected.  I did not see any other obvious lesions, specifically any thrush or ulcers.  No adenopathy.  Heart and lungs:  Normal.  Chest:  The patient has a well-healed scar in the left chest, presumably from his corpectomy in December 2011.  No spinal tenderness.  Abdomen:  Obese, nontender, with no organomegaly or masses palpable.  There is a well-healed midline scar.  He has a colostomy bag in the left lower quadrant.  Genitourinary:  He has a Foley catheter attached to closed drainage.  Neurologic:  He has some braces on his lower legs for foot support.  He is unable to move his legs at all.  He has numbness of his legs.  Ankles are quite swollen bilaterally.  He has excellent  strength in his upper extremities.  No obvious cranial nerve deficits.  LABORATORY DATA:  From 09/11/2012, white count 16.2, ANC 13.8, hemoglobin 9.3 as compared with 12.1 on 06/11/2012, hematocrit 28.3 as compared with 35.9 on 06/11/2012.  MCV 68.7, platelets 506,000 as compared with 387,000 on 06/11/2012.  CBC from today, 09/18/2012, white count 20.3, ANC 16.8, hemoglobin 9.5, hematocrit 29.6, platelets 558,000.  MCV 68.0, MCH 21.9, strongly suggesting iron deficiency. Chemistries from 09/11/2012 remarkable for an albumin of 2.6, down from 3.1 on 06/11/2012.  LDH was 155, BUN 9, creatinine 0.7, calcium 9.1. Vitamin D level from 09/11/2012 was 13, which is very low with normal being 30-89.  IgG level was 904 on 09/11/2012.  IgA 136, IgM 49, all of which are in the normal range.  Serum light chains were normal.  Lambda free light chains were 0.79 and kappa-to-lambda ratio was 1.23.  Iron studies from today are pending.  Serum immunofixation electrophoresis from 09/11/2012 showed monoclonal IgG lambda protein; thus, the patient still has evidence of a persistent multiple myeloma.  IMAGING STUDIES:   1. Chest x-ray, 2 view, from 08/08/2011 showed stable cardiomegaly, but no active lung disease. 2. CT scan of the thoracic spine with IV contrast on 03/13/2012 showed no acute findings.  There is evidence of a previous partial corpectomy at T7 with strut graft from T6 to T8.  There are old compression fractures at T8, T9, and T12. 3. CT scan of the chest and abdomen from 03/13/2012 showed cardiomegaly and some pulmonary artery enlargement suggesting pulmonary arterial hypertension.  There is tortuosity and borderline aneurysm of the ascending aorta without evidence of dissection. 4. CT scan of the abdomen and pelvis with IV contrast on 06/24/2012 showed no acute findings.  There was choledocholithiasis without evidence of cholecystitis.  There was hepatic steatosis and a  stable left  lower quadrant colostomy with peristomal hernia.   IMPRESSION AND PLAN:  As stated, today's session was quite lengthy, lasting a total of 2 hours.  Most of this time was spent in reviewing the EMR and other associated printed records from Dr. Dorothe Pea.  I should mention that we have communication from Dr. Dorothe Pea indicating his discussion with Dr. Leafy Ro from [facility].  It was felt that due to the patient's medical problems that he was not a transplant candidate.  That email apparently was transmitted on February 14, 2011.  As stated above, the patient is no longer on any specific treatment for his multiple myeloma.  Certainly, one could consider placing him on maintenance Revlimid.  I will give this some additional consideration. Alternatively, we could simply follow Doug and place him on Revlimid at the sign of earliest progression.  For the moment, it appears that his myeloma is under good control.  Current issues have to do with his low vitamin D level of 13.  We will communicate this with Dr. Dorothe Pea.  The patient apparently gets his medicines through [facility] at discount prices.  He will probably need to be on vitamin D 50,000 units weekly for the next 4-8 weeks and then go on a maintenance dose of vitamin D.  Also evident today is an anemia of new onset since November.  This appears to be due to iron deficiency.  We may need to check stools, if that is the case.  The patient will probably need some iron, either oral or IV.  As noted above, hemoglobin was 12.1, hematocrit 35.9 back on 06/11/2012.  Also of note is a low albumin of 2.6 on 09/11/2012.  If we go back to July 2013, the albumin was 3.8 and previously normal.  If this continues, we may want to check the urine to see whether he is developing proteinuria.  We may want to check a 24-hour urine.  Previous 24-hour urine at the time of diagnosis showed protein of 318 mg  The patient unfortunately developed osteonecrosis of  the right mandible related to Zometa.  His last dose of Zometa was given on 06/11/2012. That has been discontinued.  I have instructed the patient and his wife to stop the Bactrim.  He does not need this at this time.  He may deep be getting an antibiotic for what may be a localized infection of his right great toe.  Lastly, the patient apparently does have a living will.  I did not get into a more detailed discussion about code status.  As stated, at the moment the patient seems to be doing fairly well and I suspect he probably is a full code at this time.  As stated, we are awaiting the results of iron studies today.  I will plan to see the patient again in about 2 months, which will be around April 24th, at which time we will check CBC, chemistries, iron studies, vitamin D level. IgG, serum for light chains, and a urine protein-to- creatinine ratio.    ______________________________ Samul Dada, M.D. DSM/MEDQ  D:  09/18/2012  T:  09/19/2012  Job:  [id number]

## 2012-09-23 ENCOUNTER — Telehealth: Payer: Self-pay | Admitting: Oncology

## 2012-09-23 NOTE — Telephone Encounter (Signed)
lmonvm for pt on both cell and home phone re appt for 3/6 @ 2:15pm. See previous notes. Pt has schedule for April and has not been able to be contacted re inf. Pt asked to call back if he cannot keep appt,

## 2012-09-26 ENCOUNTER — Ambulatory Visit (HOSPITAL_BASED_OUTPATIENT_CLINIC_OR_DEPARTMENT_OTHER): Payer: Medicaid Other

## 2012-09-26 VITALS — BP 117/76 | HR 97 | Temp 97.0°F

## 2012-09-26 DIAGNOSIS — D509 Iron deficiency anemia, unspecified: Secondary | ICD-10-CM

## 2012-09-26 DIAGNOSIS — D649 Anemia, unspecified: Secondary | ICD-10-CM

## 2012-09-26 MED ORDER — SODIUM CHLORIDE 0.9 % IV SOLN
Freq: Once | INTRAVENOUS | Status: AC
  Administered 2012-09-26: 14:00:00 via INTRAVENOUS

## 2012-09-26 MED ORDER — FERUMOXYTOL INJECTION 510 MG/17 ML
510.0000 mg | Freq: Once | INTRAVENOUS | Status: AC
  Administered 2012-09-26: 510 mg via INTRAVENOUS
  Filled 2012-09-26: qty 17

## 2012-09-26 NOTE — Patient Instructions (Addendum)
Ferumoxytol injection (Feraheme) What is this medicine? FERUMOXYTOL is an iron complex. Iron is used to make healthy red blood cells, which carry oxygen and nutrients throughout the body. This medicine is used to treat iron deficiency anemia in people with chronic kidney disease. This medicine may be used for other purposes; ask your health care provider or pharmacist if you have questions. What should I tell my health care provider before I take this medicine? They need to know if you have any of these conditions: -anemia not caused by low iron levels -high levels of iron in the blood -magnetic resonance imaging (MRI) test scheduled -an unusual or allergic reaction to iron, other medicines, foods, dyes, or preservatives -pregnant or trying to get pregnant -breast-feeding How should I use this medicine? This medicine is for infusion into a vein. It is given by a health care professional in a hospital or clinic setting. Talk to your pediatrician regarding the use of this medicine in children. Special care may be needed. Overdosage: If you think you've taken too much of this medicine contact a poison control center or emergency room at once. Overdosage: If you think you have taken too much of this medicine contact a poison control center or emergency room at once. NOTE: This medicine is only for you. Do not share this medicine with others. What if I miss a dose? It is important not to miss your dose. Call your doctor or health care professional if you are unable to keep an appointment. What may interact with this medicine? This medicine may interact with the following medications: -other iron products This list may not describe all possible interactions. Give your health care provider a list of all the medicines, herbs, non-prescription drugs, or dietary supplements you use. Also tell them if you smoke, drink alcohol, or use illegal drugs. Some items may interact with your medicine. What should  I watch for while using this medicine? Visit your doctor or healthcare professional regularly. Tell your doctor or healthcare professional if your symptoms do not start to get better or if they get worse. You may need blood work done while you are taking this medicine. You may need to follow a special diet. Talk to your doctor. Foods that contain iron include: whole grains/cereals, dried fruits, beans, or peas, leafy green vegetables, and organ meats (liver, kidney). What side effects may I notice from receiving this medicine? Side effects that you should report to your doctor or health care professional as soon as possible: -allergic reactions like skin rash, itching or hives, swelling of the face, lips, or tongue -breathing problems -changes in blood pressure -feeling faint or lightheaded, falls -fever or chills -flushing, sweating, or hot feelings -swelling of the ankles or feet Side effects that usually do not require medical attention (Report these to your doctor or health care professional if they continue or are bothersome.): -diarrhea -headache -nausea, vomiting -stomach pain This list may not describe all possible side effects. Call your doctor for medical advice about side effects. You may report side effects to FDA at 1-800-FDA-1088. Where should I keep my medicine? This drug is given in a hospital or clinic and will not be stored at home. NOTE: This sheet is a summary. It may not cover all possible information. If you have questions about this medicine, talk to your doctor, pharmacist, or health care provider.  2012, [employer]. (04/01/2008 9:48:25 PM)

## 2012-09-27 ENCOUNTER — Encounter: Payer: Self-pay | Admitting: Oncology

## 2012-09-27 NOTE — Progress Notes (Signed)
This letter is to be addressed to Dr. Marny Lowenstein at [facility]:    Re:  Crescencio Jozwiak dob:  23-Dec-1955 MRN:  [id number]   Dear Nadine Counts,  As you and I recently discussed, I am requesting that Mr. Bebout be referred for a formal consultation at one of our regional medical centers to address the issue of high dose chemotherapy in conjunction with autologous stem cell transplant and/or other therapies, for example maintenance Lenalidomide.  It would be my preference that he be seen by Dr Eddie Candle at [facility] as I have worked closely with her during the past several years treating other patients with multiple myeloma.  Let me know if you need any additional information or if I can assist you in any way.    Thanks.   Warmest regards,     Samul Dada, M.D.

## 2012-09-30 ENCOUNTER — Encounter: Payer: Self-pay | Admitting: Oncology

## 2012-10-16 ENCOUNTER — Encounter: Payer: Self-pay | Admitting: Oncology

## 2012-10-16 NOTE — Progress Notes (Signed)
I spoke with Dr. Hubert Azure, an oral surgeon, telephone [phone]. He will be carrying out some simple debridement and conservative management of the patient's osteonecrosis of the jaw (BRONJ). If conservative management is unsuccessful, the patient will need to be considered for hyperbaric oxygen therapy which can be given here in [place].

## 2012-11-14 ENCOUNTER — Encounter: Payer: Self-pay | Admitting: Oncology

## 2012-11-14 NOTE — Progress Notes (Signed)
I received a letter from Dr. Cristal Deer L. 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 It is being suggested that the patient have treatment with hyperbaric oxygen x 20. If the area becomes infected or symptomatic, superficial debridement of the bone could be carried out. The patient may need additional hyperbaric therapy. The goal is to avoid surgical intervention.   There was exposed bone along the left internal oblique ridge of the mandible. This area measures 6 x 12 mm and was nonpainful.

## 2012-11-15 ENCOUNTER — Other Ambulatory Visit (HOSPITAL_BASED_OUTPATIENT_CLINIC_OR_DEPARTMENT_OTHER): Payer: Medicaid Other

## 2012-11-15 ENCOUNTER — Ambulatory Visit (HOSPITAL_BASED_OUTPATIENT_CLINIC_OR_DEPARTMENT_OTHER): Payer: Medicaid Other | Admitting: Physician Assistant

## 2012-11-15 ENCOUNTER — Ambulatory Visit: Payer: Medicaid Other | Admitting: Oncology

## 2012-11-15 ENCOUNTER — Telehealth: Payer: Self-pay | Admitting: Oncology

## 2012-11-15 VITALS — BP 119/86 | HR 91 | Temp 97.2°F | Resp 20 | Ht 68.5 in

## 2012-11-15 DIAGNOSIS — E559 Vitamin D deficiency, unspecified: Secondary | ICD-10-CM

## 2012-11-15 DIAGNOSIS — D649 Anemia, unspecified: Secondary | ICD-10-CM

## 2012-11-15 DIAGNOSIS — C9001 Multiple myeloma in remission: Secondary | ICD-10-CM

## 2012-11-15 DIAGNOSIS — C9000 Multiple myeloma not having achieved remission: Secondary | ICD-10-CM

## 2012-11-15 DIAGNOSIS — E8809 Other disorders of plasma-protein metabolism, not elsewhere classified: Secondary | ICD-10-CM

## 2012-11-15 DIAGNOSIS — D509 Iron deficiency anemia, unspecified: Secondary | ICD-10-CM

## 2012-11-15 DIAGNOSIS — M87180 Osteonecrosis due to drugs, jaw: Secondary | ICD-10-CM

## 2012-11-15 DIAGNOSIS — M8708 Idiopathic aseptic necrosis of bone, other site: Secondary | ICD-10-CM

## 2012-11-15 LAB — CBC WITH DIFFERENTIAL/PLATELET
BASO%: 0.6 % (ref 0.0–2.0)
Basophils Absolute: 0.1 10*3/uL (ref 0.0–0.1)
EOS%: 2.5 % (ref 0.0–7.0)
Eosinophils Absolute: 0.3 10*3/uL (ref 0.0–0.5)
HCT: 35.2 % — ABNORMAL LOW (ref 38.4–49.9)
HGB: 11 g/dL — ABNORMAL LOW (ref 13.0–17.1)
LYMPH%: 15.3 % (ref 14.0–49.0)
MCH: 21.7 pg — ABNORMAL LOW (ref 27.2–33.4)
MCHC: 31.2 g/dL — ABNORMAL LOW (ref 32.0–36.0)
MCV: 69.5 fL — ABNORMAL LOW (ref 79.3–98.0)
MONO#: 0.7 10*3/uL (ref 0.1–0.9)
MONO%: 5.2 % (ref 0.0–14.0)
NEUT#: 10.5 10*3/uL — ABNORMAL HIGH (ref 1.5–6.5)
NEUT%: 76.4 % — ABNORMAL HIGH (ref 39.0–75.0)
Platelets: 450 10*3/uL — ABNORMAL HIGH (ref 140–400)
RBC: 5.07 10*6/uL (ref 4.20–5.82)
RDW: 22.9 % — ABNORMAL HIGH (ref 11.0–14.6)
WBC: 13.8 10*3/uL — ABNORMAL HIGH (ref 4.0–10.3)
lymph#: 2.1 10*3/uL (ref 0.9–3.3)

## 2012-11-15 LAB — COMPREHENSIVE METABOLIC PANEL (CC13)
ALT: 30 U/L (ref 0–55)
AST: 17 U/L (ref 5–34)
Albumin: 3 g/dL — ABNORMAL LOW (ref 3.5–5.0)
Alkaline Phosphatase: 100 U/L (ref 40–150)
BUN: 12.5 mg/dL (ref 7.0–26.0)
CO2: 26 mEq/L (ref 22–29)
Calcium: 10.2 mg/dL (ref 8.4–10.4)
Chloride: 104 mEq/L (ref 98–107)
Creatinine: 0.8 mg/dL (ref 0.7–1.3)
Glucose: 141 mg/dl — ABNORMAL HIGH (ref 70–99)
Potassium: 3.9 mEq/L (ref 3.5–5.1)
Sodium: 142 mEq/L (ref 136–145)
Total Bilirubin: 0.3 mg/dL (ref 0.20–1.20)
Total Protein: 7.7 g/dL (ref 6.4–8.3)

## 2012-11-15 LAB — LACTATE DEHYDROGENASE (CC13): LDH: 135 U/L (ref 125–245)

## 2012-11-15 LAB — PROTEIN / CREATININE RATIO, URINE
Creatinine, Urine: 112.7 mg/dL
Protein Creatinine Ratio: 0.19 — ABNORMAL HIGH (ref <0.15)
Total Protein, Urine: 21 mg/dL

## 2012-11-15 NOTE — Telephone Encounter (Signed)
gv pt appt schedule for June. Pt aware central will call w/appt for bone survey. Per pt they should call [facility] @ [phone]. This info has been added to the comment section of the rad order.

## 2012-11-15 NOTE — Progress Notes (Signed)
[facility]  [phone]    OFFICE PROGRESS NOTE     CC: Jethro Bastos, M.D.   PROBLEM LIST:  1. 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 The patient underwent biopsy and cord decompression by Dr. Kerri Perches on 07/15/2010. He underwent radiation treatments which concluded by 08/22/2010. The patient received treatment with Revlimid, Decadron, and Velcade in combination with Zometa. His initial IgG levels were as high as 5570 back in February 2012. Initial 24-hour urine protein came to 318 mg, positive for IgG heavy chains and lambda light chains. I  do not think the patient ever had a bone marrow or a metastatic bone survey; however, CT scans of abdomen and pelvis carried out on 12/09/2010 have shown myelomatous involvement of the bones. A CT scan of the head on 05/05/2011 was normal. The patient has had an outstanding response to treatment. IgG level from 09/11/2012 was 904. Serum immunofixation electrophoresis continues to show monoclonal IgG lambda protein. It appears that Velcade was discontinued in September 2012 and that Revlimid and Decadron were discontinued in March 2013. 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 The patient is presently on no specific treatment for his IgG lambda multiple myeloma.  2. Paraplegia which developed in December 2011. The patient is status post surgical decompression of the T8 paraspinal mass by Dr. Maeola Harman. He is also status post radiation treatments.  3. Neurogenic bladder and bowel.  4. 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 An inferior vena cava filter had been placed on November 10, 2010. The patient was started on Xarelto on 06/12/2012.  5. Placement of an IVC filter on 11/10/2010.   6. 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  7. End-colostomy.  8. History of C. difficile.  9. Adrenal insufficiency.  10. Diabetes mellitus.  11. History of cataracts, status post surgery.  12. Cholelithiasis noted on CT scan of abdomen and pelvis from 06/24/2012.  13. Vitamin D deficiency noted on 09/11/2012 when vitamin D level was  14. Hypoalbuminemia.  15. Anemia noted 09/11/2012 with hemoglobin 9.3, hematocrit 28.3, and low red cell indices, suspected to be due to iron deficiency.iron on 09/18/2012 was 11, TIBC 3:30, percentage saturation 3 and  Ferritin 46. 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 This area measures 6 x 12 mm and was nonpainful.he was referred to Dr. Lincoln Brigham, DDS for evaluation.It is being suggested that the patient have treatment with hyperbaric oxygen x 20. If the area becomes infected or symptomatic, superficial debridement of the bone could be carried out.The goal is to avoid surgical intervention.       SMOKING HISTORY: The patient has never smoked cigarettes    MEDICATIONS:  Current Outpatient Prescriptions  Medication Sig Dispense Refill  . acetaminophen (TYLENOL) 325 MG tablet Take 325-650 mg by mouth every 6 (six) hours as needed. For pain.       Marland Kitchen dexamethasone (DECADRON) 0.75 MG tablet Take 0.75 mg by mouth. Two tabs QOD on even days, 1 tab QOD  on odd days      . finasteride (PROSCAR) 5 MG tablet Take 5 mg by mouth daily.      Marland Kitchen latanoprost (XALATAN) 0.005 % ophthalmic solution Place 1 drop into both eyes at bedtime.      . metFORMIN (GLUCOPHAGE) 500 MG tablet Take 500 mg by mouth 2 (two) times daily with a meal.      . methocarbamol  (ROBAXIN) 500 MG tablet Take 500 mg by mouth every 6 (six) hours as needed. For muscle spasms.      Marland Kitchen oxyCODONE (OXY IR/ROXICODONE) 5 MG immediate release tablet Take 1 tablet (5 mg total) by mouth every 4 (four) hours as needed for pain. For pain.  30 tablet  0  . pantoprazole (PROTONIX) 40 MG tablet Take 40 mg by mouth daily.      . Rivaroxaban (XARELTO) 20 MG TABS Take 20 mg by mouth daily.       No current facility-administered medications for this visit.   HISTORY:The patient is without any major complaints today.He has been seen in the interim by Dr. Alwyn Pea surgeon, telephone [phone] who wishes to proceed with  simple debridement and conservative management of the patient's  biophosphonate osteonecrosis of the jaw (BRONJ). If conservative management is unsuccessful, the patient will need to be considered for hyperbaric oxygen therapy x 20  which can be given here in [place].  As stated, he does have a colostomy in the left lower quadrant. He also has an indwelling Foley via the penis.    ALLERGIES:  No Known Allergies   PHYSICAL EXAMINATION:   Filed Vitals:   11/15/12 0905  BP: 119/86  Pulse: 91  Temp: 97.2 F (36.2 C)  Resp: 20   General: The patient is very pleasant, alert, in no acute distress. He is in a wheelchair. Weight is estimated to be approximately 250 pounds, height 5 feet 8-1/2 inches HEENT: There is no scleral icterus. Pupillary and extraocular movements are normal. Mouth and pharynx - dentition is fair. There was exposed bone along the left internal oblique ridge of the mandible. This area measures 6 x 12 mm and was nonpainful. No other obvious lesions, specifically any thrush or ulcers. No adenopathy. Heart and lungs: Normal. Chest: The patient has a well-healed scar in the left chest, presumably from his corpectomy in December 2011. No spinal tenderness. Abdomen: Obese, nontender, with no organomegaly or masses palpable. There is a well-healed midline  scar. He has a colostomy bag in the left lower quadrant. Genitourinary: He has a Foley catheter attached to closed drainage. Neurologic: He has some braces on his  lower legs for foot support. He is unable to move his legs at all. He has numbness of his legs. Ankles are quite swollen bilaterally. He has excellent strength in his upper extremities. No obvious cranial nerve deficits.  LABORATORY/RADIOLOGY DATA:   Recent Labs Lab 11/15/12 0823  WBC 13.8*  HGB 11.0*  HCT 35.2*  PLT 450*  MCV 69.5*  MCH 21.7*  MCHC 31.2*  RDW 22.9*  LYMPHSABS 2.1  MONOABS 0.7  EOSABS 0.3  BASOSABS 0.1    CMP    Recent Labs Lab 11/15/12 0823  NA 142  K 3.9  CL 104  CO2 26  GLUCOSE 141*  BUN 12.5  CREATININE 0.8  CALCIUM 10.2  AST 17  ALT 30  ALKPHOS 100  BILITOT 0.30    On 09/18/2012 his white count was 20.3 ANC 16.8 hemoglobin 9.5 hematocrit 29.6 MCV 68 platelets  558.iron studies and 09/18/12 showed iron of 11,TIBC 330 percentage saturation 3 and ferritin 46, strongly suggesting iron deficiency. From 09/11/2012, white count 16.2, ANC 13.8, hemoglobin 9.3 as compared with 12.1 on 06/11/2012, hematocrit 28.3 as compared with 35.9 on 06/11/2012. MCV 68.7, platelets 506,000 as compared with 387,000 on 06/11/2012.  Chemistries from 09/11/2012 remarkable for an albumin of 2.6, down from 3.1 on 06/11/2012. LDH was 155, BUN 9, creatinine 0.7, calcium 9.1. Vitamin D level from 09/11/2012 was 13, which is very low with normal being 30-89. IgG level was 904 on 09/11/2012. IgA 136, IgM 49, all of which are in the normal range. Serum light chains were normal. Lambda free light chains were 0.79 and kappa-to-lambda ratio was 1.23. Serum immunofixation electrophoresis from 09/11/2012 showed monoclonal IgG lambda protein; thus, the patient still has evidence of a persistent multiple myeloma.  Anemia panel:   Recent Labs  11/15/12 0823  FERRITIN 127  TIBC 285  IRON 26*      Radiology Studies:  1.  Chest x-ray, 2 view, from 08/08/2011 showed stable cardiomegaly, but no active lung disease.  2. CT scan of the thoracic spine with IV contrast on 03/13/2012 showed no acute findings. There is evidence of a previous partial corpectomy at T7 with strut graft from T6 to T8. There are old compression fractures at T8, T9, and T12.  3. CT scan of the chest and abdomen from 03/13/2012 showed cardiomegaly and some pulmonary artery enlargement suggesting pulmonary arterial  hypertension. There is tortuosity and borderline aneurysm of the ascending aorta without evidence of dissection.  4. CT scan of the abdomen and pelvis with IV contrast on 06/24/2012 showed no acute findings. There was choledocholithiasis without evidence of  cholecystitis. There was hepatic steatosis and a stable left lower quadrant colostomy with peristomal hernia   ASSESSMENT AND PLAN:  For the moment, it appears that his myeloma is under good control. Current issues have to do with his low vitamin D level of 13. We will communicate this with Dr. Dorothe Pea. The patient apparently gets his medicines through [facility] at discount prices. He will probably need to be on vitamin D 50,000 units weekly for the next 4-8 weeks and then go on a maintenance dose of vitamin D.  Also evident today is an anemia of new onset since November. This appears to be due to iron deficiency. We may need to check stools, if that is the case. The patient will probably need some iron, either oral or IV. As noted above, hemoglobin was 12.1, hematocrit 35.9 back on 06/11/2012.  Also of note is a low albumin of 2.6 on 09/11/2012. If we go back to July 2013, the albumin was 3.8 and previously normal. If this continues, we may want to check the urine to see whether he is developing proteinuria. We may want to check a 24-hour urine. Previous 24-hour urine at the time of diagnosis showed protein of 318 mg The patient unfortunately developed osteonecrosis of the right mandible  related to Zometa. His last dose of Zometa was given on 06/11/2012. That has been discontinued. Dr. Arline Asp to confer with Dr. Knox Royalty about future plans.    Last, the patient apparently does have a living will. I did not get into a more detailed discussion about code status. As stated, at the moment the patient seems to be doing fairly well and I suspect he probably is a full code at this time. As stated, we are awaiting the results of iron studies today.Dr. Arline Asp  plan to see the patient again in about 2 months, which will be around 01/16/2013, at which time we will check CBC, chemistries, iron studies, vitamin D level. IgG, serum for light chains,24 hr protein, UIFE, SIFE, and metastatic bone survey.      Marcos Eke, PA-C 11/15/2012, 6:46 PM

## 2012-11-15 NOTE — Patient Instructions (Signed)
Follow up  In 2 months with Dr. Arline Asp with labs

## 2012-11-18 LAB — IRON AND TIBC
%SAT: 9 % — ABNORMAL LOW (ref 20–55)
Iron: 26 ug/dL — ABNORMAL LOW (ref 42–165)
TIBC: 285 ug/dL (ref 215–435)
UIBC: 259 ug/dL (ref 125–400)

## 2012-11-18 LAB — IGG: IgG (Immunoglobin G), Serum: 1230 mg/dL (ref 650–1600)

## 2012-11-18 LAB — KAPPA/LAMBDA LIGHT CHAINS
Kappa free light chain: 1.93 mg/dL (ref 0.33–1.94)
Kappa:Lambda Ratio: 1.15 (ref 0.26–1.65)
Lambda Free Lght Chn: 1.68 mg/dL (ref 0.57–2.63)

## 2012-11-18 LAB — VITAMIN D 25 HYDROXY (VIT D DEFICIENCY, FRACTURES): Vit D, 25-Hydroxy: 63 ng/mL (ref 30–89)

## 2012-11-18 LAB — FERRITIN: Ferritin: 127 ng/mL (ref 22–322)

## 2012-11-22 ENCOUNTER — Other Ambulatory Visit (HOSPITAL_COMMUNITY): Payer: Self-pay | Admitting: *Deleted

## 2012-11-22 ENCOUNTER — Emergency Department (HOSPITAL_COMMUNITY)
Admission: EM | Admit: 2012-11-22 | Discharge: 2012-11-22 | Disposition: A | Discharge status: Home / Self Care | Payer: Medicare (Managed Care) | Attending: Emergency Medicine | Admitting: Emergency Medicine

## 2012-11-22 ENCOUNTER — Encounter (HOSPITAL_COMMUNITY)
Admission: RE | Admit: 2012-11-22 | Discharge: 2012-11-22 | Disposition: A | Discharge status: Home / Self Care | Payer: Medicare (Managed Care) | Source: Ambulatory Visit | Attending: Family Medicine | Admitting: Family Medicine

## 2012-11-22 DIAGNOSIS — Z859 Personal history of malignant neoplasm, unspecified: Secondary | ICD-10-CM | POA: Insufficient documentation

## 2012-11-22 DIAGNOSIS — T7840XA Allergy, unspecified, initial encounter: Secondary | ICD-10-CM

## 2012-11-22 DIAGNOSIS — D649 Anemia, unspecified: Secondary | ICD-10-CM | POA: Insufficient documentation

## 2012-11-22 DIAGNOSIS — R000 Tachycardia, unspecified: Secondary | ICD-10-CM | POA: Insufficient documentation

## 2012-11-22 DIAGNOSIS — IMO0002 Reserved for concepts with insufficient information to code with codable children: Secondary | ICD-10-CM | POA: Insufficient documentation

## 2012-11-22 DIAGNOSIS — Z8719 Personal history of other diseases of the digestive system: Secondary | ICD-10-CM | POA: Insufficient documentation

## 2012-11-22 DIAGNOSIS — Z87898 Personal history of other specified conditions: Secondary | ICD-10-CM | POA: Insufficient documentation

## 2012-11-22 DIAGNOSIS — Z79899 Other long term (current) drug therapy: Secondary | ICD-10-CM | POA: Insufficient documentation

## 2012-11-22 DIAGNOSIS — T503X5A Adverse effect of electrolytic, caloric and water-balance agents, initial encounter: Secondary | ICD-10-CM | POA: Insufficient documentation

## 2012-11-22 DIAGNOSIS — Z87448 Personal history of other diseases of urinary system: Secondary | ICD-10-CM | POA: Insufficient documentation

## 2012-11-22 MED ORDER — DIPHENHYDRAMINE HCL 50 MG/ML IJ SOLN
25.0000 mg | Freq: Once | INTRAMUSCULAR | Status: AC
  Administered 2012-11-22: 25 mg via INTRAVENOUS
  Filled 2012-11-22: qty 1

## 2012-11-22 MED ORDER — FERUMOXYTOL INJECTION 510 MG/17 ML
INTRAVENOUS | Status: AC
  Administered 2012-11-22: 510 mg via INTRAVENOUS
  Filled 2012-11-22: qty 17

## 2012-11-22 MED ORDER — METHYLPREDNISOLONE SODIUM SUCC 125 MG IJ SOLR
125.0000 mg | Freq: Once | INTRAMUSCULAR | Status: AC
  Administered 2012-11-22: 125 mg via INTRAVENOUS
  Filled 2012-11-22: qty 2

## 2012-11-22 MED ORDER — PREDNISONE 20 MG PO TABS: 40.0000 mg | ORAL_TABLET | Freq: Every day | ORAL | Status: DC

## 2012-11-22 MED ORDER — FERUMOXYTOL INJECTION 510 MG/17 ML: 510.0000 mg | Freq: Once | INTRAVENOUS | Status: AC

## 2012-11-22 MED ORDER — FAMOTIDINE IN NACL 20-0.9 MG/50ML-% IV SOLN
20.0000 mg | Freq: Once | INTRAVENOUS | Status: AC
  Administered 2012-11-22: 20 mg via INTRAVENOUS
  Filled 2012-11-22: qty 50

## 2012-11-22 MED ORDER — SODIUM CHLORIDE 0.9 % IV SOLN
Freq: Once | INTRAVENOUS | Status: AC
  Administered 2012-11-22: 250 mL via INTRAVENOUS

## 2012-11-22 NOTE — ED Provider Notes (Addendum)
History     CSN: [id number]  Arrival date & time 11/22/12  1424   First MD Initiated Contact with Patient 11/22/12 1428      Chief Complaint  Patient presents with  . Allergic Reaction    (Consider location/radiation/quality/duration/timing/severity/associated sxs/prior treatment) Patient is a 57 y.o. male presenting with allergic reaction. The history is provided by the patient and medical records.  Allergic Reaction The primary symptoms do not include wheezing, shortness of breath, cough, abdominal pain, nausea, vomiting, diarrhea, palpitations, altered mental status or rash. Primary symptoms comment: states that he just feels chills. The current episode started less than 1 hour ago. The problem has been gradually worsening. This is a new problem.  The onset of the reaction was associated with a new medication (started appx after getting a ferraheme infusion for the first time). Significant symptoms that are not present include rhinorrhea or itching.    Past Medical History  Diagnosis Date  . Multiple myeloma(203.0)     thoracic T8 with paraplegia s/p resection- on chemo at visit 10/13/10  . Neurogenic bladder   . Neurogenic bowel   . Partial small bowel obstruction during dec 2011 admission  . Multiple myeloma(203.0)   . Multiple myeloma without mention of remission   . Cancer     Past Surgical History  Procedure Laterality Date  . Myeloma thoracic t8 with parpaplegia s/p thoracotomy and thoracic t7-9 cage placement on dec 26th 2011  07/18/10  . Laparotomy  07/20/2011    Procedure: EXPLORATORY LAPAROTOMY;  Surgeon: Rulon Abide, DO;  Location: [facility] OR;  Service: General;  Laterality: N/A;  . Colostomy revision  07/20/2011    Procedure: COLON RESECTION SIGMOID;  Surgeon: Rulon Abide, DO;  Location[facility] OR;  Service: General;;  . Colostomy  07/20/2011    Procedure: COLOSTOMY;  Surgeon: Rulon Abide, DO;  Location: [facility] OR;  Service: General;;    No  family history on file.  History  Substance Use Topics  . Smoking status: Never Smoker   . Smokeless tobacco: Never Used  . Alcohol Use: No      Review of Systems  HENT: Negative for rhinorrhea, drooling, trouble swallowing and voice change.   Respiratory: Negative for cough, shortness of breath and wheezing.   Cardiovascular: Negative for palpitations.  Gastrointestinal: Negative for nausea, vomiting, abdominal pain and diarrhea.  Skin: Negative for itching and rash.  Psychiatric/Behavioral: Negative for altered mental status.  All other systems reviewed and are negative.    Allergies  Review of patient's allergies indicates no known allergies.  Home Medications   Current Outpatient Rx  Name  Route  Sig  Dispense  Refill  . acetaminophen (TYLENOL) 325 MG tablet   Oral   Take 325-650 mg by mouth every 6 (six) hours as needed. For pain.          Marland Kitchen dexamethasone (DECADRON) 0.75 MG tablet   Oral   Take 0.75 mg by mouth. Two tabs QOD on even days, 1 tab QOD on odd days         . finasteride (PROSCAR) 5 MG tablet   Oral   Take 5 mg by mouth daily.         Marland Kitchen latanoprost (XALATAN) 0.005 % ophthalmic solution   Both Eyes   Place 1 drop into both eyes at bedtime.         . metFORMIN (GLUCOPHAGE) 500 MG tablet   Oral   Take 500 mg by mouth 2 (  two) times daily with a meal.         . methocarbamol (ROBAXIN) 500 MG tablet   Oral   Take 500 mg by mouth every 6 (six) hours as needed. For muscle spasms.         Marland Kitchen oxyCODONE (OXY IR/ROXICODONE) 5 MG immediate release tablet   Oral   Take 1 tablet (5 mg total) by mouth every 4 (four) hours as needed for pain. For pain.   30 tablet   0   . pantoprazole (PROTONIX) 40 MG tablet   Oral   Take 40 mg by mouth daily.         . Rivaroxaban (XARELTO) 20 MG TABS   Oral   Take 20 mg by mouth daily.           BP 126/64  Pulse 125  Temp(Src) 97.9 F (36.6 C) (Oral)  Resp 22  SpO2 95%  Physical Exam   Nursing note and vitals reviewed. Constitutional: He is oriented to person, place, and time. He appears well-developed and well-nourished. No distress.  HENT:  Head: Normocephalic and atraumatic.  Mouth/Throat: Oropharynx is clear and moist.  No tongue or uvular edema  Eyes: Conjunctivae and EOM are normal. Pupils are equal, round, and reactive to light.  Neck: Normal range of motion. Neck supple.  Cardiovascular: Regular rhythm and intact distal pulses.  Tachycardia present.   No murmur heard. Pulmonary/Chest: Effort normal and breath sounds normal. No respiratory distress. He has no wheezes. He has no rales.  Abdominal: Soft. He exhibits no distension. There is no tenderness. There is no rebound and no guarding.  Musculoskeletal: Normal range of motion. He exhibits no edema and no tenderness.  Bilateral feet are in boots to prevent pressure sores and contractures of bilateral lower ext.  Neurological: He is alert and oriented to person, place, and time.  Skin: Skin is warm and dry. No rash noted. No erythema.  Psychiatric: He has a normal mood and affect. His behavior is normal.    ED Course  Procedures (including critical care time)  Labs Reviewed - No data to display No results found.   1. Allergic reaction caused by a drug       MDM   Patient sent from urgent care do to having a reaction to ferraheme infusion that he had appx 2.5 hours ago.  States that this is the first time he had this infusion. He denies any shortness of breath, pruritus or rash. No throat swelling however he is tachycardic and appears mildly uncomfortable. Do not appreciate any oral swelling and he has no wheezing on exam but is tachycardic. However it appears the patient has baseline tachycardia because prior to the start of his infusion his heart rate was 113. Patient has a history of multiple myeloma including myeloma of his thoracic spine with paraplegia and is wheelchair-bound. Will place patient on  the monitor and give Solu-Medrol, Benadryl and Pepcid. We'll observe the patient and insure improvement in symptoms.  3:39 PM Pt is starting to feel better.  5:32 PM Pt is feeling better.  Will d/c home.    Gwyneth Sprout, MD 11/22/12 1539  Gwyneth Sprout, MD 11/22/12 1734  Gwyneth Sprout, MD 11/22/12 1735

## 2012-11-22 NOTE — ED Notes (Signed)
Per RN from Urgent Care, pt was seen there for IV medication for his anemia.  Pt given IV Feraheme 510 mg.  Pt has never had this medication before.  30 minutes after infusion was complete pt started developing chills and feels very cold to touch.  Pt denying chest pain, shortness of breath, nausea/vomiting.  Pt alert and oriented x4.

## 2012-11-22 NOTE — ED Notes (Signed)
[facility] contacted for transport 

## 2012-11-22 NOTE — ED Notes (Signed)
Update given to pt's transport service.  Informed by Collie Siad that after 5 pm [facility] would be available to pick him up.

## 2012-11-22 NOTE — Progress Notes (Signed)
Called into room to look at patient because of pt having tremors.  Pt was verbal but unable to obtain VS due to tremors.  Sheria Lang with the pt to the ER t while I called the ER charge nurse to report we were sending him downstairs immedietly. I also called and spoke with Dr. Malon Kindle nurse to report he had a reaction to the [facility] and was sent to the ER.  She stated she would let his family know.

## 2012-11-22 NOTE — ED Notes (Signed)
Rounded on pt.  Wife given update on the phone.

## 2012-11-28 ENCOUNTER — Encounter (HOSPITAL_BASED_OUTPATIENT_CLINIC_OR_DEPARTMENT_OTHER): Payer: Medicare (Managed Care) | Attending: Internal Medicine

## 2012-11-28 DIAGNOSIS — M278 Other specified diseases of jaws: Secondary | ICD-10-CM | POA: Insufficient documentation

## 2012-11-28 DIAGNOSIS — C9000 Multiple myeloma not having achieved remission: Secondary | ICD-10-CM | POA: Insufficient documentation

## 2012-11-28 DIAGNOSIS — T50995A Adverse effect of other drugs, medicaments and biological substances, initial encounter: Secondary | ICD-10-CM | POA: Insufficient documentation

## 2012-11-29 NOTE — Progress Notes (Signed)
[facility]  NAME:  HODGE, STACHNIK               ACCOUNT NO.:  [id number]  MEDICAL RECORD NO.:  [id number]      DATE OF BIRTH:  09-Mar-1956  PHYSICIAN:  Maxwell Caul, M.D.      VISIT DATE:                                  OFFICE VISIT   HISTORY:  Mr. Feick is a gentleman we actually know through an admission to [facility] in the early part of 2013 at that point, he had predominantly venous stasis wounds on his lower legs.  He is a gentleman who has been battling multiple myeloma for a number years and he has a thoracic T8 with paraplegia I believe secondary to multiple myeloma.  During the course of his treatment, he has been on IV Zometa. He developed an open area in his mandible in March 2014.  He was reviewed by Lincoln Brigham, DDS with the diagnosis of osteonecrosis of the jaw, likely secondary to bisphosphonate therapy.  He is no longer on bisphosphonate.  He is referred here for consideration of HBO therapy.  Mr. Chriscoe is not complaining of any discomfort or drainage.  On examination, HEENT, his oral exam is notable for reasonably extensive area of exposed bone along the left internal oblique ridge of the mandible.  This is essentially as noted by Dr. Jeanice Lim.  There is no surrounding infection.  No drainage.  No lymphadenopathy.  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  As far as I am aware this form of osteonecrosis is not an indication for hyperbaric oxygen therapy.  I am not at the time of dictation 100% certain about this, however, look into this further and informed the patient and the [facility] program.  Dr. Luciana Axe is his primary physician.  However, I told the patient I think this would be unlikely to be approved for HBO even if there was a pair source.  As mentioned, I will research this further and get back to all involved.          ______________________________ Maxwell Caul,  M.D.     MGR/MEDQ  D:  11/28/2012  T:  11/29/2012  Job:  [id number]

## 2012-12-24 ENCOUNTER — Telehealth: Payer: Self-pay

## 2012-12-24 NOTE — Telephone Encounter (Signed)
lvm about the possible referral of pt to [facility] and Dr Barbaraann Boys for consultation. Per Dr Murinson's letter of 09/27/12.

## 2012-12-24 NOTE — Telephone Encounter (Signed)
Dr Dorothe Pea returned my call and requested Dr Lillia Abed name and phone number so he could contact her.

## 2013-01-09 ENCOUNTER — Telehealth: Payer: Self-pay

## 2013-01-09 NOTE — Telephone Encounter (Signed)
S/w [name]. Pt has an appt at [facility] 6/26. She thought it was tomorrow, I apologized for the confusion. I called to clarify if pt has an appt with Dr Barbaraann Boys. He does not. This information forwarded to Dr Arline Asp

## 2013-01-16 ENCOUNTER — Ambulatory Visit (HOSPITAL_BASED_OUTPATIENT_CLINIC_OR_DEPARTMENT_OTHER): Payer: Medicare (Managed Care) | Admitting: Oncology

## 2013-01-16 ENCOUNTER — Ambulatory Visit (HOSPITAL_COMMUNITY): Payer: Medicare (Managed Care)

## 2013-01-16 ENCOUNTER — Telehealth: Payer: Self-pay | Admitting: Oncology

## 2013-01-16 ENCOUNTER — Other Ambulatory Visit: Payer: Self-pay | Admitting: Oncology

## 2013-01-16 ENCOUNTER — Other Ambulatory Visit (HOSPITAL_COMMUNITY): Payer: Medicare (Managed Care)

## 2013-01-16 ENCOUNTER — Encounter: Payer: Self-pay | Admitting: Oncology

## 2013-01-16 ENCOUNTER — Telehealth: Payer: Self-pay

## 2013-01-16 ENCOUNTER — Other Ambulatory Visit (HOSPITAL_BASED_OUTPATIENT_CLINIC_OR_DEPARTMENT_OTHER): Payer: Medicaid Other | Admitting: Lab

## 2013-01-16 VITALS — BP 123/79 | HR 94 | Temp 97.6°F | Resp 18 | Ht 68.0 in

## 2013-01-16 DIAGNOSIS — C9001 Multiple myeloma in remission: Secondary | ICD-10-CM

## 2013-01-16 DIAGNOSIS — C9000 Multiple myeloma not having achieved remission: Secondary | ICD-10-CM

## 2013-01-16 DIAGNOSIS — D509 Iron deficiency anemia, unspecified: Secondary | ICD-10-CM

## 2013-01-16 DIAGNOSIS — M87180 Osteonecrosis due to drugs, jaw: Secondary | ICD-10-CM

## 2013-01-16 DIAGNOSIS — E8809 Other disorders of plasma-protein metabolism, not elsewhere classified: Secondary | ICD-10-CM

## 2013-01-16 DIAGNOSIS — T50904A Poisoning by unspecified drugs, medicaments and biological substances, undetermined, initial encounter: Secondary | ICD-10-CM

## 2013-01-16 DIAGNOSIS — E559 Vitamin D deficiency, unspecified: Secondary | ICD-10-CM

## 2013-01-16 LAB — CBC WITH DIFFERENTIAL/PLATELET
BASO%: 0.9 % (ref 0.0–2.0)
Basophils Absolute: 0.1 10*3/uL (ref 0.0–0.1)
EOS%: 1.6 % (ref 0.0–7.0)
Eosinophils Absolute: 0.2 10*3/uL (ref 0.0–0.5)
HCT: 36.8 % — ABNORMAL LOW (ref 38.4–49.9)
HGB: 12.3 g/dL — ABNORMAL LOW (ref 13.0–17.1)
LYMPH%: 12.1 % — ABNORMAL LOW (ref 14.0–49.0)
MCH: 24 pg — ABNORMAL LOW (ref 27.2–33.4)
MCHC: 33.4 g/dL (ref 32.0–36.0)
MCV: 71.7 fL — ABNORMAL LOW (ref 79.3–98.0)
MONO#: 0.6 10*3/uL (ref 0.1–0.9)
MONO%: 4.3 % (ref 0.0–14.0)
NEUT#: 11.7 10*3/uL — ABNORMAL HIGH (ref 1.5–6.5)
NEUT%: 81.1 % — ABNORMAL HIGH (ref 39.0–75.0)
Platelets: 437 10*3/uL — ABNORMAL HIGH (ref 140–400)
RBC: 5.13 10*6/uL (ref 4.20–5.82)
RDW: 22.3 % — ABNORMAL HIGH (ref 11.0–14.6)
WBC: 14.4 10*3/uL — ABNORMAL HIGH (ref 4.0–10.3)
lymph#: 1.7 10*3/uL (ref 0.9–3.3)

## 2013-01-16 LAB — COMPREHENSIVE METABOLIC PANEL (CC13)
ALT: 36 U/L (ref 0–55)
AST: 21 U/L (ref 5–34)
Albumin: 3.1 g/dL — ABNORMAL LOW (ref 3.5–5.0)
Alkaline Phosphatase: 97 U/L (ref 40–150)
BUN: 11.5 mg/dL (ref 7.0–26.0)
CO2: 26 mEq/L (ref 22–29)
Calcium: 10 mg/dL (ref 8.4–10.4)
Chloride: 106 mEq/L (ref 98–109)
Creatinine: 0.8 mg/dL (ref 0.7–1.3)
Glucose: 92 mg/dl (ref 70–140)
Potassium: 3.8 mEq/L (ref 3.5–5.1)
Sodium: 142 mEq/L (ref 136–145)
Total Bilirubin: 0.43 mg/dL (ref 0.20–1.20)
Total Protein: 8 g/dL (ref 6.4–8.3)

## 2013-01-16 LAB — LACTATE DEHYDROGENASE (CC13): LDH: 141 U/L (ref 125–245)

## 2013-01-16 NOTE — Telephone Encounter (Signed)
S/w Tonya at [facility] requesting they fax pt's medication list.

## 2013-01-16 NOTE — Telephone Encounter (Signed)
Gave pt appt for August 2014 lab and MD °

## 2013-01-16 NOTE — Progress Notes (Signed)
CC:   [name], M.D.  PROBLEM LIST:  1. Multiple myeloma, IgG lambda, presenting in December 2011 with back  pain and leg weakness due to a cord compression at T8 which  ultimately resulted in paraplegia. The patient underwent biopsy  and cord decompression by Dr. Kerri Howell on 07/15/2010. He  underwent radiation treatments which concluded by 08/22/2010. The  patient received treatment with Revlimid, Decadron, and Velcade in  combination with Zometa. His initial IgG levels were as high as  5570 back in February 2012. Initial 24-hour urine protein came to  318 mg, positive for IgG heavy chains and lambda light chains. I  do not think the patient ever had a bone marrow or a metastatic  bone survey; however, CT scans of abdomen and pelvis carried out on  12/09/2010 have shown myelomatous involvement of the bones. A CT  scan of the head on 05/05/2011 was normal. The patient has had an  outstanding response to treatment. IgG level from 09/11/2012 was  904. Serum immunofixation electrophoresis continues to show  monoclonal IgG lambda protein. It appears that Velcade was  discontinued in September 2012 and that Revlimid and Decadron were  discontinued in March 2013. 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 The patient is presently on no  specific treatment for his IgG lambda multiple myeloma.  2. Paraplegia which developed in December 2011. The patient is status  post surgical decompression of the T8 paraspinal mass by Dr. Maeola Howell. He is also status post radiation treatments.  3. Neurogenic bladder and bowel.  4. 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  An inferior vena cava filter had been placed on November 10, 2010.  The patient was started on Xarelto on 06/12/2012.  5. Placement of an IVC filter on 11/10/2010.  6. History of sigmoid diverticulitis  with perforation in December 2012  resulting in a sigmoid colectomy and end-colostomy on 07/20/2011.  7. End-colostomy.  8. History of C. difficile.  9. Adrenal insufficiency.  10. Diabetes mellitus.  11. History of cataracts, status post surgery.  12. Cholelithiasis noted on CT scan of abdomen and pelvis from 06/24/2012.  13. Vitamin D deficiency noted on 09/11/2012 when vitamin D level was 13. 14. Hypoalbuminemia. 15. Anemia noted on 09/11/2012 with a hemoglobin of 9.3, hematocrit     28.3, and low red cell indices.  Ferritin was 46.  Iron saturation     3%.  The patient was given IV Feraheme 510 mg on 09/26/2012.  The     patient also received another dose of IV Feraheme 510 mg on     11/22/2012.  The patient apparently had rigors following this     second dose of IV Feraheme and needed to go to the emergency room     for treatment.  He did receive IV Solu-Medrol 125 mg.  Hemoglobin     and hematocrit on 01/16/2013 were 12.3 and 36.8, respectively.     Thus this patient did have iron deficiency successfully treated     with IV iron.  If he is to receive IV iron in the future, he may     need premedication with Benadryl, Solu-Medrol and Pepcid. 16. 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  The patient's dentist is Dr.     Francene Howell.  MEDICATIONS:  Reviewed and recorded. Current Outpatient Prescriptions  Medication Sig Dispense Refill  . acetaminophen (TYLENOL) 325 MG tablet Take 325-650 mg by mouth every 6 (six) hours as needed. For pain.       . chlorhexidine (PERIDEX) 0.12 % solution Use as directed 15 mLs in the mouth or throat 5 (five) times daily.      . cholecalciferol (VITAMIN D) 1000 UNITS tablet Take 1,000 Units by mouth daily.      Marland Kitchen dexamethasone (DECADRON) 0.75 MG tablet Take 0.75 mg by mouth. Two tabs QOD on even days, 1 tab QOD on odd days      . finasteride (PROSCAR) 5 MG tablet Take 5 mg by  mouth daily.      Marland Kitchen latanoprost (XALATAN) 0.005 % ophthalmic solution Place 1 drop into both eyes at bedtime.      . metFORMIN (GLUCOPHAGE) 500 MG tablet Take 500 mg by mouth 2 (two) times daily with a meal.      . methocarbamol (ROBAXIN) 500 MG tablet Take 500 mg by mouth every 6 (six) hours as needed. For muscle spasms.      . Rivaroxaban (XARELTO) 20 MG TABS Take 20 mg by mouth daily.       No current facility-administered medications for this visit.     SMOKING HISTORY:  Patient has never smoked cigarettes.     HISTORY:  I am seeing [name] today for followup of his IgG lambda multiple myeloma along with multiple other associated problems as outlined above.  [name] was last seen by [place] on of 11/15/2012 and prior to that on 09/18/2012.  Fortunately, there have been no major changes in his condition.  He was treated for iron deficiency anemia as noted above on 09/26/2012 and 11/22/2012.  He received 510 mg of IV Feraheme.  The first time was uneventful.  The 2nd time he developed rigors and went to the emergency room for treatment.  His anemia though has responded quite well to the IV infusions with a rise in his hemoglobin from 9.3 up to 12.3  The patient's other main problem is osteonecrosis of the right posterior mandible secondary to IV Zometa 1st noted in December 2013.  I have spoken with the patient's dentist Dr. Lincoln Howell back in late April.  Dr. Jeanice Howell was trying to see if hyperbaric oxygen could be given.  Unfortunately, there are payment issues.  I believe the patient is under no specific treatment at this time.  If the area became infected or symptomatic, some sort of surgical therapy or debridement was going to be necessary.  [name] tells me that he is not having any symptoms and that this area is being watched closely.  As has been previously noted, the multiple myeloma is not under treatment at this time.  A very good remission was obtained  with Revlimid, Decadron and Velcade.  In going through the records, it appeared that Velcade was discontinued in September 2012 and that the Revlimid and Decadron were discontinued in March 2013.  The patient's disease onset was in December 2011.  [name] is without any complaints today.  He has not had any hospitalizations or major setbacks.  He denies any pain.  Appetite is good.  He has had no skin breakdown.  He is here today in a wheelchair. He has a colostomy and an indwelling Foley.  He is paraplegic with neurogenic bladder and bowel.  He is on Xarelto.  PHYSICAL EXAMINATION:  General:  [name] looks well.  He is in a wheelchair.  He is in no acute distress.  He is quite Teacher, English as a foreign language.  Vital Signs:  Weight is not recorded, but apparently runs around 250-270 pounds.  Height 5 feet 8 inches.  Blood pressure 123/79.  Other vital signs are normal.  HEENT:  There is no scleral icterus.  Mouth and pharynx notable for area of exposed mandible in the posterior mandible on the lingual side.  The tongue needs to be pushed aside with a gloved finger.  There is no adenopathy palpable.  Heart and Lungs:  Normal. Abdomen:  Obese, nontender.  There is a colostomy in the left lower quadrant.  The patient does have a Foley catheter.  Extremities:  He is paraplegic.  I did not take note of any edema.  He has braces on his legs for support.  LABORATORY DATA:  Today, white count 14.4, ANC 11.7, hemoglobin 12.3, hematocrit 36.8, platelets 437,000.  MCV and MCH are still low 71.7 and 24.0, respectively.  Chemistries today notable for an albumin of 3.1, total protein 8.0 and his globulins were 4.9.  LDH 141, alkaline phosphatase 97, BUN 12, creatinine 0.8.  On 11/15/2012, ferritin was 127, iron saturation 9%.  Previously on 09/18/2012, ferritin had been 46, iron saturation 3%.  Vitamin D level on 09/11/2012 was 13 but 63 on 11/15/2012.  IgG level on 11/15/2012 was 1230 as compared with 1270  on 06/11/2012 and 904 on 09/11/2012.  Lambda light chains on 11/15/2012 were normal at 1.68 as were the kappa free light chains.  The kappa to lambda ratio was 1.15 which is normal.  A 24-hour urine is to be collected for protein and a urine immunofixation electrophoresis.  Serum immunofixation electrophoresis from today is pending.  On 09/11/2012, the serum immunofixation electrophoresis showed a monoclonal IgG lambda protein.  A urine protein to creatinine ratio on 11/15/2012 was 0.19. Pending from today are  light chains, quantitative immunoglobulins with serum immunofixation electrophoresis, vitamin D level, and iron studies. As stated, a 24-hour urine for protein and a urine immunofixation electrophoresis is to be collected.   IMAGING STUDIES:  1. Chest x-ray, 2 view, from 08/08/2011 showed stable  cardiomegaly, but no active lung disease.  2. CT scan of the thoracic spine with IV contrast on 03/13/2012 showed no  acute findings. There is evidence of a previous partial corpectomy at  T7 with strut graft from T6 to T8. There are old compression fractures  at T8, T9, and T12.  3. CT scan of the chest and abdomen from 03/13/2012 showed cardiomegaly and  some pulmonary artery enlargement suggesting pulmonary arterial  hypertension. There is tortuosity and borderline aneurysm of the  ascending aorta without evidence of dissection.  4. CT scan of the abdomen and pelvis with IV contrast on 06/24/2012 showed  no acute findings. There was choledocholithiasis without evidence of  cholecystitis. There was hepatic steatosis and a stable left lower  quadrant colostomy with peristomal hernia.    IMPRESSION AND PLAN:  Patient's overall clinical condition is stable.  I am concerned about what appears to be increasing globulins.  In looking over the patient's records, I do not see where he has had a metastatic bone survey.  We may want to go ahead and order that.  Studies are as above.  Two  dominant issues are the fact that the patient is on no specific therapy for his multiple myeloma.  He has been fortunate and has enjoyed a nice remission of his disease off of  treatment now for over 1 year. As stated, he has been off of Velcade for almost 2 years.  We are trying to see if we can get the patient an appointment with Dr. Barbaraann Boys at [facility].  I have been in contact with Dr. Dorothe Pea on several occasions.  My last communication with him was just last week on 01/10/2013.  Dr. Dorothe Pea had been in contact with Dr. Barbaraann Boys and was trying to arrange an appointment with her with regard to consideration of whether the patient would be a candidate for high-dose therapy and stem cell rescue or other treatments suggestions she may have.  Finances are a very big problem with this patient.  The second issue has to do with the osteonecrosis of the right mandible currently under conservative treatment.  We will probably go ahead and order a metastatic bone survey.  Toretto will return in 2 months, at which time we will check CBC, chemistries, quantitative immunoglobulin serum for light chains, beta 2 microglobulin, vitamin D level, and iron studies.  As stated, the patient will bring back his urine collection for 24 hours, and we will go ahead with a metastatic bone survey.    ______________________________ Samul Dada, M.D. DSM/MEDQ  D:  01/16/2013  T:  01/16/2013  Job:  [id number]

## 2013-01-16 NOTE — Progress Notes (Signed)
This office note has been dictated.  #[id number]

## 2013-01-20 LAB — VITAMIN D 25 HYDROXY (VIT D DEFICIENCY, FRACTURES): Vit D, 25-Hydroxy: 58 ng/mL (ref 30–89)

## 2013-01-20 LAB — KAPPA/LAMBDA LIGHT CHAINS
Kappa free light chain: 1.93 mg/dL (ref 0.33–1.94)
Kappa:Lambda Ratio: 1.35 (ref 0.26–1.65)
Lambda Free Lght Chn: 1.43 mg/dL (ref 0.57–2.63)

## 2013-01-20 LAB — IRON AND TIBC
%SAT: 17 % — ABNORMAL LOW (ref 20–55)
Iron: 43 ug/dL (ref 42–165)
TIBC: 250 ug/dL (ref 215–435)
UIBC: 207 ug/dL (ref 125–400)

## 2013-01-20 LAB — IMMUNOFIXATION ELECTROPHORESIS
IgA: 163 mg/dL (ref 68–379)
IgG (Immunoglobin G), Serum: 1310 mg/dL (ref 650–1600)
IgM, Serum: 58 mg/dL (ref 41–251)
Total Protein, Serum Electrophoresis: 6.9 g/dL (ref 6.0–8.3)

## 2013-01-20 LAB — FERRITIN: Ferritin: 292 ng/mL (ref 22–322)

## 2013-01-21 DIAGNOSIS — R310 Gross hematuria: Secondary | ICD-10-CM

## 2013-01-21 DIAGNOSIS — Z9289 Personal history of other medical treatment: Secondary | ICD-10-CM

## 2013-01-21 HISTORY — DX: Personal history of other medical treatment: Z92.89

## 2013-01-21 HISTORY — DX: Gross hematuria: R31.0

## 2013-01-22 LAB — UIFE/LIGHT CHAINS/TP QN, 24-HR UR
Albumin, U: DETECTED
Alpha 1, Urine: DETECTED — AB
Alpha 2, Urine: DETECTED — AB
Beta, Urine: DETECTED — AB
Free Kappa Lt Chains,Ur: 0.62 mg/dL (ref 0.14–2.42)
Free Kappa/Lambda Ratio: 15.5 ratio — ABNORMAL HIGH (ref 2.04–10.37)
Free Lambda Excretion/Day: 1.08 mg/d
Free Lambda Lt Chains,Ur: 0.04 mg/dL (ref 0.02–0.67)
Free Lt Chn Excr Rate: 16.74 mg/d
Gamma Globulin, Urine: DETECTED — AB
Time: 24 hours
Total Protein, Urine-Ur/day: 86 mg/d (ref 10–140)
Total Protein, Urine: 3.2 mg/dL
Volume, Urine: 2700 mL

## 2013-02-12 ENCOUNTER — Inpatient Hospital Stay (HOSPITAL_COMMUNITY)
Admission: EM | Admit: 2013-02-12 | Discharge: 2013-02-17 | DRG: 920 | Disposition: A | Discharge status: Home Health | Payer: Medicare (Managed Care) | Attending: Emergency Medicine | Admitting: Emergency Medicine

## 2013-02-12 ENCOUNTER — Encounter (HOSPITAL_COMMUNITY): Payer: Self-pay

## 2013-02-12 ENCOUNTER — Inpatient Hospital Stay (HOSPITAL_COMMUNITY): Payer: Medicare (Managed Care)

## 2013-02-12 DIAGNOSIS — R579 Shock, unspecified: Secondary | ICD-10-CM

## 2013-02-12 DIAGNOSIS — K592 Neurogenic bowel, not elsewhere classified: Secondary | ICD-10-CM | POA: Diagnosis present

## 2013-02-12 DIAGNOSIS — G4733 Obstructive sleep apnea (adult) (pediatric): Secondary | ICD-10-CM | POA: Diagnosis present

## 2013-02-12 DIAGNOSIS — D689 Coagulation defect, unspecified: Secondary | ICD-10-CM | POA: Diagnosis present

## 2013-02-12 DIAGNOSIS — E876 Hypokalemia: Secondary | ICD-10-CM | POA: Diagnosis present

## 2013-02-12 DIAGNOSIS — N319 Neuromuscular dysfunction of bladder, unspecified: Secondary | ICD-10-CM | POA: Diagnosis present

## 2013-02-12 DIAGNOSIS — D649 Anemia, unspecified: Secondary | ICD-10-CM

## 2013-02-12 DIAGNOSIS — Z7901 Long term (current) use of anticoagulants: Secondary | ICD-10-CM

## 2013-02-12 DIAGNOSIS — G8220 Paraplegia, unspecified: Secondary | ICD-10-CM | POA: Diagnosis present

## 2013-02-12 DIAGNOSIS — E119 Type 2 diabetes mellitus without complications: Secondary | ICD-10-CM | POA: Diagnosis present

## 2013-02-12 DIAGNOSIS — E669 Obesity, unspecified: Secondary | ICD-10-CM

## 2013-02-12 DIAGNOSIS — D509 Iron deficiency anemia, unspecified: Secondary | ICD-10-CM

## 2013-02-12 DIAGNOSIS — R319 Hematuria, unspecified: Secondary | ICD-10-CM | POA: Diagnosis present

## 2013-02-12 DIAGNOSIS — D62 Acute posthemorrhagic anemia: Secondary | ICD-10-CM | POA: Diagnosis present

## 2013-02-12 DIAGNOSIS — K5720 Diverticulitis of large intestine with perforation and abscess without bleeding: Secondary | ICD-10-CM

## 2013-02-12 DIAGNOSIS — D72829 Elevated white blood cell count, unspecified: Secondary | ICD-10-CM | POA: Diagnosis present

## 2013-02-12 DIAGNOSIS — E2749 Other adrenocortical insufficiency: Secondary | ICD-10-CM | POA: Diagnosis present

## 2013-02-12 DIAGNOSIS — Z933 Colostomy status: Secondary | ICD-10-CM

## 2013-02-12 DIAGNOSIS — Z923 Personal history of irradiation: Secondary | ICD-10-CM

## 2013-02-12 DIAGNOSIS — M87180 Osteonecrosis due to drugs, jaw: Secondary | ICD-10-CM

## 2013-02-12 DIAGNOSIS — Z79899 Other long term (current) drug therapy: Secondary | ICD-10-CM

## 2013-02-12 DIAGNOSIS — I82509 Chronic embolism and thrombosis of unspecified deep veins of unspecified lower extremity: Secondary | ICD-10-CM | POA: Diagnosis present

## 2013-02-12 DIAGNOSIS — G959 Disease of spinal cord, unspecified: Secondary | ICD-10-CM

## 2013-02-12 DIAGNOSIS — E8809 Other disorders of plasma-protein metabolism, not elsewhere classified: Secondary | ICD-10-CM

## 2013-02-12 DIAGNOSIS — C9001 Multiple myeloma in remission: Secondary | ICD-10-CM | POA: Diagnosis present

## 2013-02-12 DIAGNOSIS — E559 Vitamin D deficiency, unspecified: Secondary | ICD-10-CM

## 2013-02-12 DIAGNOSIS — IMO0002 Reserved for concepts with insufficient information to code with codable children: Secondary | ICD-10-CM

## 2013-02-12 DIAGNOSIS — R5381 Other malaise: Secondary | ICD-10-CM | POA: Diagnosis present

## 2013-02-12 DIAGNOSIS — T8119XA Other postprocedural shock, initial encounter: Principal | ICD-10-CM | POA: Diagnosis present

## 2013-02-12 DIAGNOSIS — R310 Gross hematuria: Secondary | ICD-10-CM | POA: Diagnosis present

## 2013-02-12 DIAGNOSIS — M2450 Contracture, unspecified joint: Secondary | ICD-10-CM | POA: Diagnosis present

## 2013-02-12 DIAGNOSIS — R42 Dizziness and giddiness: Secondary | ICD-10-CM

## 2013-02-12 DIAGNOSIS — E2740 Unspecified adrenocortical insufficiency: Secondary | ICD-10-CM | POA: Diagnosis present

## 2013-02-12 DIAGNOSIS — Q549 Hypospadias, unspecified: Secondary | ICD-10-CM

## 2013-02-12 DIAGNOSIS — I959 Hypotension, unspecified: Secondary | ICD-10-CM

## 2013-02-12 DIAGNOSIS — R1084 Generalized abdominal pain: Secondary | ICD-10-CM

## 2013-02-12 DIAGNOSIS — Y846 Urinary catheterization as the cause of abnormal reaction of the patient, or of later complication, without mention of misadventure at the time of the procedure: Secondary | ICD-10-CM | POA: Diagnosis present

## 2013-02-12 DIAGNOSIS — R339 Retention of urine, unspecified: Secondary | ICD-10-CM

## 2013-02-12 LAB — BASIC METABOLIC PANEL
BUN: 12 mg/dL (ref 6–23)
CO2: 21 mEq/L (ref 19–32)
Calcium: 8.6 mg/dL (ref 8.4–10.5)
Chloride: 104 mEq/L (ref 96–112)
Creatinine, Ser: 0.9 mg/dL (ref 0.50–1.35)
GFR calc Af Amer: >90 mL/min (ref >90)
GFR calc non Af Amer: >90 mL/min (ref >90)
Glucose, Bld: 134 mg/dL — ABNORMAL HIGH (ref 70–99)
Potassium: 3.5 mEq/L (ref 3.5–5.1)
Sodium: 134 mEq/L — ABNORMAL LOW (ref 135–145)

## 2013-02-12 LAB — CBC WITH DIFFERENTIAL/PLATELET
Basophils Absolute: 0.2 10*3/uL — ABNORMAL HIGH (ref 0.0–0.1)
Basophils Relative: 1 % (ref 0–1)
Eosinophils Absolute: 0.6 10*3/uL (ref 0.0–0.7)
Eosinophils Relative: 3 % (ref 0–5)
HCT: 24.7 % — ABNORMAL LOW (ref 39.0–52.0)
Hemoglobin: 8.3 g/dL — ABNORMAL LOW (ref 13.0–17.0)
Lymphocytes Relative: 17 % (ref 12–46)
Lymphs Abs: 3.3 10*3/uL (ref 0.7–4.0)
MCH: 23.8 pg — ABNORMAL LOW (ref 26.0–34.0)
MCHC: 33.6 g/dL (ref 30.0–36.0)
MCV: 70.8 fL — ABNORMAL LOW (ref 78.0–100.0)
Monocytes Absolute: 1 10*3/uL (ref 0.1–1.0)
Monocytes Relative: 5 % (ref 3–12)
Neutro Abs: 14.5 10*3/uL — ABNORMAL HIGH (ref 1.7–7.7)
Neutrophils Relative %: 74 % (ref 43–77)
Platelets: 456 10*3/uL — ABNORMAL HIGH (ref 150–400)
RBC: 3.49 MIL/uL — ABNORMAL LOW (ref 4.22–5.81)
RDW: 19.5 % — ABNORMAL HIGH (ref 11.5–15.5)
WBC: 19.6 10*3/uL — ABNORMAL HIGH (ref 4.0–10.5)

## 2013-02-12 LAB — PROTIME-INR
INR: 2.43 — ABNORMAL HIGH (ref 0.00–1.49)
Prothrombin Time: 25.6 seconds — ABNORMAL HIGH (ref 11.6–15.2)

## 2013-02-12 LAB — CBC
HCT: 25.9 % — ABNORMAL LOW (ref 39.0–52.0)
Hemoglobin: 9 g/dL — ABNORMAL LOW (ref 13.0–17.0)
MCH: 25.7 pg — ABNORMAL LOW (ref 26.0–34.0)
MCHC: 34.7 g/dL (ref 30.0–36.0)
MCV: 74 fL — ABNORMAL LOW (ref 78.0–100.0)
Platelets: 336 10*3/uL (ref 150–400)
RBC: 3.5 MIL/uL — ABNORMAL LOW (ref 4.22–5.81)
RDW: 19.9 % — ABNORMAL HIGH (ref 11.5–15.5)
WBC: 20.1 10*3/uL — ABNORMAL HIGH (ref 4.0–10.5)

## 2013-02-12 LAB — GLUCOSE, CAPILLARY
Glucose-Capillary: 118 mg/dL — ABNORMAL HIGH (ref 70–99)
Glucose-Capillary: 178 mg/dL — ABNORMAL HIGH (ref 70–99)

## 2013-02-12 LAB — PROCALCITONIN: Procalcitonin: <0.1 ng/mL

## 2013-02-12 LAB — TROPONIN I
Troponin I: <0.3 ng/mL (ref <0.30)
Troponin I: <0.3 ng/mL (ref <0.30)

## 2013-02-12 LAB — LIPASE, BLOOD: Lipase: 38 U/L (ref 11–59)

## 2013-02-12 LAB — MRSA PCR SCREENING: MRSA by PCR: NEGATIVE

## 2013-02-12 LAB — PREPARE RBC (CROSSMATCH)

## 2013-02-12 MED ORDER — DOPAMINE-DEXTROSE 3.2-5 MG/ML-% IV SOLN
2.0000 ug/kg/min | INTRAVENOUS | Status: DC
  Filled 2013-02-12: qty 250

## 2013-02-12 MED ORDER — SODIUM CHLORIDE 0.9 % IV SOLN: 250.0000 mL | INTRAVENOUS | Status: DC | PRN

## 2013-02-12 MED ORDER — INSULIN ASPART 100 UNIT/ML ~~LOC~~ SOLN: 0.0000 [IU] | Freq: Every day | SUBCUTANEOUS | Status: DC

## 2013-02-12 MED ORDER — FAMOTIDINE IN NACL 20-0.9 MG/50ML-% IV SOLN
20.0000 mg | Freq: Two times a day (BID) | INTRAVENOUS | Status: DC
  Administered 2013-02-12: 20 mg via INTRAVENOUS
  Filled 2013-02-12 (×2): qty 50

## 2013-02-12 MED ORDER — SODIUM CHLORIDE 0.9 % IV SOLN
INTRAVENOUS | Status: DC
  Administered 2013-02-12: 13:00:00 via INTRAVENOUS

## 2013-02-12 MED ORDER — DOPAMINE-DEXTROSE 3.2-5 MG/ML-% IV SOLN
INTRAVENOUS | Status: AC
  Administered 2013-02-12: 800 mg
  Filled 2013-02-12: qty 250

## 2013-02-12 MED ORDER — VITAMIN D3 25 MCG (1000 UNIT) PO TABS
1000.0000 [IU] | ORAL_TABLET | Freq: Every day | ORAL | Status: DC
  Administered 2013-02-13 – 2013-02-17 (×5): 1000 [IU] via ORAL
  Filled 2013-02-12 (×6): qty 1

## 2013-02-12 MED ORDER — CHLORHEXIDINE GLUCONATE 0.12 % MT SOLN
15.0000 mL | Freq: Two times a day (BID) | OROMUCOSAL | Status: DC
  Administered 2013-02-13 (×2): 15 mL via OROMUCOSAL
  Filled 2013-02-12: qty 15

## 2013-02-12 MED ORDER — PROTHROMBIN COMPLEX CONC HUMAN 500 UNITS IV KIT
2582.0000 [IU] | PACK | INTRAVENOUS | Status: DC
  Filled 2013-02-12: qty 103

## 2013-02-12 MED ORDER — EMPTY CONTAINERS FLEXIBLE MISC
5148.0000 [IU] | Status: AC
  Administered 2013-02-12: 5148 [IU] via INTRAVENOUS
  Filled 2013-02-12: qty 206

## 2013-02-12 MED ORDER — CHLORHEXIDINE GLUCONATE 0.12 % MT SOLN
15.0000 mL | Freq: Every day | OROMUCOSAL | Status: DC
  Filled 2013-02-12 (×4): qty 15

## 2013-02-12 MED ORDER — SODIUM CHLORIDE 0.9 % IV BOLUS (SEPSIS)
2000.0000 mL | Freq: Once | INTRAVENOUS | Status: AC
  Administered 2013-02-12: 2000 mL via INTRAVENOUS

## 2013-02-12 MED ORDER — SODIUM CHLORIDE 0.9 % IV SOLN
INTRAVENOUS | Status: DC
  Administered 2013-02-14 (×2): via INTRAVENOUS
  Administered 2013-02-14: 75 mL/h via INTRAVENOUS
  Administered 2013-02-15: 12:00:00 via INTRAVENOUS

## 2013-02-12 MED ORDER — METHOCARBAMOL 500 MG PO TABS: 500.0000 mg | ORAL_TABLET | Freq: Four times a day (QID) | ORAL | Status: DC | PRN

## 2013-02-12 MED ORDER — LATANOPROST 0.005 % OP SOLN
1.0000 [drp] | Freq: Every day | OPHTHALMIC | Status: DC
  Administered 2013-02-12 – 2013-02-16 (×5): 1 [drp] via OPHTHALMIC
  Filled 2013-02-12 (×2): qty 2.5

## 2013-02-12 MED ORDER — HYDROCORTISONE SOD SUCCINATE 100 MG IJ SOLR
50.0000 mg | Freq: Four times a day (QID) | INTRAMUSCULAR | Status: DC
  Administered 2013-02-12 – 2013-02-14 (×8): 50 mg via INTRAVENOUS
  Filled 2013-02-12: qty 2
  Filled 2013-02-12: qty 1
  Filled 2013-02-12: qty 2
  Filled 2013-02-12 (×2): qty 1
  Filled 2013-02-12: qty 2
  Filled 2013-02-12 (×2): qty 1
  Filled 2013-02-12 (×2): qty 2
  Filled 2013-02-12 (×2): qty 1
  Filled 2013-02-12: qty 2
  Filled 2013-02-12: qty 1

## 2013-02-12 MED ORDER — ACETAMINOPHEN 325 MG PO TABS: 325.0000 mg | ORAL_TABLET | Freq: Four times a day (QID) | ORAL | Status: DC | PRN

## 2013-02-12 MED ORDER — INSULIN ASPART 100 UNIT/ML ~~LOC~~ SOLN
0.0000 [IU] | Freq: Three times a day (TID) | SUBCUTANEOUS | Status: DC
  Administered 2013-02-13: 5 [IU] via SUBCUTANEOUS
  Administered 2013-02-13: 3 [IU] via SUBCUTANEOUS
  Administered 2013-02-14: 2 [IU] via SUBCUTANEOUS

## 2013-02-12 NOTE — ED Notes (Signed)
Bed:WA04<BR> Expected date:<BR> Expected time:<BR> Means of arrival:<BR> Comments:<BR> closed

## 2013-02-12 NOTE — ED Notes (Signed)
Thus far, my efforts have been to give n.s iv boluses (with EMS thus far he has received a total of of nss iv.)  He becomes less responsive at times.  Also, upon attempting irrigation of his initial foley (18 fr.), I received no return.  I then replaced his foley per Dr. Effie Shy order with a #24 3-way cath.  I then irrigated with of sterile nss and got back ~120 ml return.

## 2013-02-12 NOTE — ED Notes (Signed)
At this time I give report to [facility], RN and transport him to ICU with con't. ecg monitoring.

## 2013-02-12 NOTE — ED Notes (Signed)
He is drowsy and in no distress.  Dr. Effie Shy has been in communication with urology regarding foley.

## 2013-02-12 NOTE — ED Notes (Signed)
Pt comes from [facility], adult daycare.  Where he was waiting to take a shower and noticed blood coming out around the catheter. They changed the cath out there and continuing to bleed.  EMS gave 1L NS total between bag at facility and from EMS bag.  Pt has spinal cancer with paralysis from waist down.  Pt a&O

## 2013-02-12 NOTE — Progress Notes (Signed)
Utilization Review completed.  Quatavious Rossa RN CM  

## 2013-02-12 NOTE — ED Provider Notes (Signed)
History    CSN: [id number] Arrival date & time 02/12/13  1030  First MD Initiated Contact with Patient 02/12/13 1101     Chief Complaint  Patient presents with  . bleeding catheter    (Consider location/radiation/quality/duration/timing/severity/associated sxs/prior Treatment) HPI Comments: [name] is a 57 y.o. Male is here for evaluation of bleeding from penis and hypotension. He presented to his adult daycare today, and they sent him here for evaluation. He came by EMS, and was treated with IV, normal saline 1 Howell during transport. Duration of bleeding, it is unknown.  L5 caveat- acute illness  The history is provided by the patient.   Past Medical History  Diagnosis Date  . Multiple myeloma     thoracic T8 with paraplegia s/p resection- on chemo at visit 10/13/10  . Neurogenic bladder   . Neurogenic bowel   . Partial small bowel obstruction during dec 2011 admission  . Multiple myeloma   . Multiple myeloma without mention of remission   . Cancer    Past Surgical History  Procedure Laterality Date  . Myeloma thoracic t8 with parpaplegia s/p thoracotomy and thoracic t7-9 cage placement on dec 26th 2011  07/18/10  . Laparotomy  07/20/2011    Procedure: EXPLORATORY LAPAROTOMY;  Surgeon: Rulon Abide, DO;  Location: [facility] OR;  Service: General;  Laterality: N/A;  . Colostomy revision  07/20/2011    Procedure: COLON RESECTION SIGMOID;  Surgeon: Rulon Abide, DO;  Location: [facility] OR;  Service: General;;  . Colostomy  07/20/2011    Procedure: COLOSTOMY;  Surgeon: Rulon Abide, DO;  Location: [facility] OR;  Service: General;;   History reviewed. No pertinent family history. History  Substance Use Topics  . Smoking status: Never Smoker   . Smokeless tobacco: Never Used  . Alcohol Use: No    Review of Systems  Unable to perform ROS   Allergies  Review of patient's allergies indicates not on file.  Home Medications   Current Outpatient Rx  Name  Route  Sig   Dispense  Refill  . acetaminophen (TYLENOL) 325 MG tablet   Oral   Take 325-650 mg by mouth every 6 (six) hours as needed. For pain.          . chlorhexidine (PERIDEX) 0.12 % solution   Mouth/Throat   Use as directed 15 mLs in the mouth or throat 5 (five) times daily.         . cholecalciferol (VITAMIN D) 1000 UNITS tablet   Oral   Take 1,000 Units by mouth daily.         Marland Kitchen dexamethasone (DECADRON) 0.75 MG tablet   Oral   Take 0.75 mg by mouth. Two tabs QOD on even days, 1 tab QOD on odd days         . finasteride (PROSCAR) 5 MG tablet   Oral   Take 5 mg by mouth daily.         Marland Kitchen latanoprost (XALATAN) 0.005 % ophthalmic solution   Both Eyes   Place 1 drop into both eyes at bedtime.         . metFORMIN (GLUCOPHAGE) 500 MG tablet   Oral   Take 500 mg by mouth 2 (two) times daily with a meal.         . methocarbamol (ROBAXIN) 500 MG tablet   Oral   Take 500 mg by mouth every 6 (six) hours as needed. For muscle spasms.         Marland Kitchen  Rivaroxaban (XARELTO) 20 MG TABS   Oral   Take 20 mg by mouth daily.          BP 58/46  Pulse 80  Temp(Src) 97 F (36.1 C) (Rectal)  Resp 16  Ht 5' 8.11" (1.73 m)  Wt 270 lb (122.471 kg)  BMI 40.92 kg/m2  SpO2 94% Physical Exam  Nursing note and vitals reviewed. Constitutional: He appears well-developed. He appears distressed.  obese  HENT:  Head: Normocephalic and atraumatic.  Right Ear: External ear normal.  Left Ear: External ear normal.  Pale mucous membranes  Eyes: Conjunctivae and EOM are normal. Pupils are equal, round, and reactive to light.  Neck: Normal range of motion and phonation normal. Neck supple.  Cardiovascular: Normal rate, regular rhythm, normal heart sounds and intact distal pulses.   Pulmonary/Chest: Effort normal and breath sounds normal. He exhibits no bony tenderness.  Abdominal: Soft. Normal appearance. He exhibits no mass. There is no tenderness. There is no guarding.  Genitourinary:   Active bleeding around Foley catheter is in place through the urethral meatus. The penis and scrotum, and scrotal contents otherwise appear normal.  Musculoskeletal: Normal range of motion.  Neurological: He is alert. He has normal strength. No cranial nerve deficit or sensory deficit. Coordination normal.  Flaccid lower extremities, bilateral  Skin: Skin is warm, dry and intact.  Psychiatric: He has a normal mood and affect. His behavior is normal. Judgment and thought content normal.    ED Course  Procedures (including critical care time) Medications  0.9 %  sodium chloride infusion ( Intravenous New Bag/Given 02/12/13 1235)  sodium chloride 0.9 % bolus 2,000 mL (2,000 mLs Intravenous New Bag/Given 02/12/13 1100)  DOPamine (INTROPIN) 3.2-5 MG/ML-% infusion (10 mcg/kg/min  Rate/Dose Change 02/12/13 1300)  prothrombin complex conc human (KCENTRA) IVPB 5,148 Units (5,148 Units Intravenous Given 02/12/13 1240)     Bladder scan indicates urinary retention.  Report catheter with irrigation, ordered.     Patient Vitals for the past 24 hrs:  BP Temp Temp src Pulse Resp SpO2 Height Weight  02/12/13 1300 - 97 F (36.1 C) Rectal - - - - -  02/12/13 1259 58/46 mmHg 97 F (36.1 C) Rectal 80 - 94 % - -  02/12/13 1245 124/82 mmHg - - 51 16 100 % - -  02/12/13 1228 87/57 mmHg - - 83 14 99 % - -  02/12/13 1214 85/69 mmHg 97.4 F (36.3 C) Axillary 76 17 98 % - -  02/12/13 1211 - - - - - - - 270 lb (122.471 kg)  02/12/13 1200 71/46 mmHg - - - - - 5' 8.11" (1.73 m) 265 lb (120.203 kg)  02/12/13 1146 97/66 mmHg - - 67 11 - - -  02/12/13 1132 86/56 mmHg 97.4 F (36.3 C) Oral 99 18 93 % - -  02/12/13 1056 93/53 mmHg - - - - - - -   11:50 AM Reevaluation with update and discussion. After initial assessment and treatment, an updated evaluation reveals patient remained hypotensive, currently 97/66. He continues to receive IV fluid, at maximal rate from 2 peripheral IVs. Blood has just now been typed  and crossmatched. The patient remains alert, and responsive. He, states that he "feels dizzy". [name]   11:47 AM-Consult complete with  Dr. Annabell Howell. Patient case explained and discussed. The patient was seen, in the urology office, yesterday, had hematuria then, and was told to followup as usual. He suggests using a 22-gauge, hematuria catheter for bladder irrigation.  Call ended at 1153  12:00 hours- the hematuria, has not been stabilized, so will reverse his Xarelto, with Kcentra.  12:22 hours- low-dose dopamine started for systolic blood pressure 63. Packed red cells infusing currently  13:00- time being spent to manage, recurrent hypotension, chilling from treatments, multiple phone calls with consultants. I have again discussed status with urology, Dr. Berneice Heinrich was unable to assist in the ED at this time. I have contacted critical care, to see the patient and assist with management and admission.    Date: 02/12/13  Rate: 73  Rhythm: normal sinus rhythm  QRS Axis: normal  PR and QT Intervals: Slightly prolonged QT  ST/T Wave abnormalities: normal  PR and QRS Conduction Disutrbances:none  Narrative Interpretation:   Old EKG Reviewed: QT longer than on 05/05/11    CRITICAL CARE Performed by: Mancel Bale Howell Total critical care time: 120 minutes Critical care time was exclusive of separately billable procedures and treating other patients. Critical care was necessary to treat or prevent imminent or life-threatening deterioration. Critical care was time spent personally by me on the following activities: development of treatment plan with patient and/or surrogate as well as nursing, discussions with consultants, evaluation of patient's response to treatment, examination of patient, obtaining history from patient or surrogate, ordering and performing treatments and interventions, ordering and review of laboratory studies, ordering and review of radiographic studies, pulse oximetry and  re-evaluation of patient's condition.   Labs Reviewed  CBC WITH DIFFERENTIAL - Abnormal; Notable for the following:    WBC 19.6 (*)    RBC 3.49 (*)    Hemoglobin 8.3 (*)    HCT 24.7 (*)    MCV 70.8 (*)    MCH 23.8 (*)    RDW 19.5 (*)    Platelets 456 (*)    Neutro Abs 14.5 (*)    Basophils Absolute 0.2 (*)    All other components within normal limits  BASIC METABOLIC PANEL - Abnormal; Notable for the following:    Sodium 134 (*)    Glucose, Bld 134 (*)    All other components within normal limits  PROTIME-INR - Abnormal; Notable for the following:    Prothrombin Time 25.6 (*)    INR 2.43 (*)    All other components within normal limits  LIPASE, BLOOD  URINALYSIS, ROUTINE W REFLEX MICROSCOPIC  TYPE AND SCREEN  PREPARE RBC (CROSSMATCH)  PREPARE FRESH FROZEN PLASMA    1. Hematuria   2. Urinary retention   3. Hypotension   4. Anemia   5. Anticoagulation excessive     MDM  Hematuria secondary hypotension, complicated by therapeutic anticoagulation for DVT. Patient has an IVC filter in place. Possibly a traumatic catheterization causing the bleeding. Patient is unstable and required admission to ICU for management by critical care service.  Nursing Notes Reviewed/ Care Coordinated, and agree without changes. Applicable Imaging Reviewed.  Interpretation of Laboratory Data incorporated into ED treatment  Flint Melter, MD 02/12/13 1528

## 2013-02-12 NOTE — ED Notes (Signed)
[id number]<YQ> Expected date:<BR> Expected time:<BR> Means of arrival:<BR> Comments:<BR> EMS-bleeding around cath

## 2013-02-12 NOTE — ED Notes (Signed)
[name] has just been in and repositioned foley with resultant return of grossly bloody urine and irrigant. [name] irrigates his foley with of nss with few sm.-med. Clots obtained. Dr. Delton Coombes is here now also, and is writing admit orders.

## 2013-02-12 NOTE — Progress Notes (Signed)
eLink Physician-Brief Progress Note Patient Name: [name] DOB: Mar 07, 1956 MRN: [id number]  Date of Service  02/12/2013   HPI/Events of Note   Patient c/o that peridex5 times daily is too much  eICU Interventions  Change to bid   Intervention Category Minor Interventions: Other:;Routine modifications to care plan (e.g. PRN medications for pain, fever)  Benard Minturn 02/12/2013, 9:34 PM

## 2013-02-12 NOTE — ED Notes (Signed)
Immediately after starting Kcentra he begins to chill; also he is still being transfused. Dr. Effie Shy has [place] stop Dopamine at this time also.  Kcentra being given per instruction of pharmacist.

## 2013-02-12 NOTE — Consult Note (Signed)
CC: Foley not draining.   HPI: I was called to see [name] by Dr. Effie Shy for foley problems and hematuria.   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   He is on Xarelto for DVT.   He was in the office on 7/22 for a catheter change and has a history of bleeding from the prostate.  There were problems with the foley this morning so an 13fr coude catheter was placed and he began to have bleeding and poor output.  He was transferred to the ER where a bladder scan showed 900cc.  He had a foley placed but it didn't drain so a 60fr 3 way was tried.  It was felt to be in good position but fluid could only be instilled and not withdrawn.    He was also found to be hypotensive.      ROS:  Patient unable to respond.  Level 5 caveat.    Past Medical History  Diagnosis Date  . Multiple myeloma     thoracic T8 with paraplegia s/p resection- on chemo at visit 10/13/10  . Neurogenic bladder   . Neurogenic bowel   . Partial small bowel obstruction during dec 2011 admission  . Multiple myeloma   . Multiple myeloma without mention of remission   . Cancer    Past Surgical History  Procedure Laterality Date  . Myeloma thoracic t8 with parpaplegia s/p thoracotomy and thoracic t7-9 cage placement on dec 26th 2011  07/18/10  . Laparotomy  07/20/2011    Procedure: EXPLORATORY LAPAROTOMY;  Surgeon: Rulon Abide, DO;  Location: [facility] OR;  Service: General;  Laterality: N/A;  . Colostomy revision  07/20/2011    Procedure: COLON RESECTION SIGMOID;  Surgeon: Rulon Abide, DO;  Location: [facility] OR;  Service: General;;  . Colostomy  07/20/2011    Procedure: COLOSTOMY;  Surgeon: Rulon Abide, DO;  Location: [facility] OR;  Service: General;;   History   Social History  . Marital Status: Married    Spouse Name: N/A    Number of Children: N/A  . Years of Education: N/A   Occupational History  . Not on file.   Social History Main Topics  . Smoking status: Never  Smoker   . Smokeless tobacco: Never Used  . Alcohol Use: No  . Drug Use: No  . Sexually Active: No   Other Topics Concern  . Not on file   Social History Narrative  . No narrative on file  History reviewed. No pertinent family history. Not on File   Medication List    ASK your doctor about these medications       acetaminophen 325 MG tablet  Commonly known as:  TYLENOL  Take 325-650 mg by mouth every 6 (six) hours as needed. For pain.     chlorhexidine 0.12 % solution  Commonly known as:  PERIDEX  Use as directed 15 mLs in the mouth or throat 5 (five) times daily.     cholecalciferol 1000 UNITS tablet  Commonly known as:  VITAMIN D  Take 1,000 Units by mouth daily.     dexamethasone 0.75 MG tablet  Commonly known as:  DECADRON  Take 0.75 mg by mouth. Two tabs QOD on even days, 1 tab QOD on odd days     finasteride 5 MG tablet  Commonly known as:  PROSCAR  Take 5 mg by mouth daily.     latanoprost 0.005 % ophthalmic solution  Commonly known as:  XALATAN  Place 1 drop into both eyes at bedtime.     metFORMIN 500 MG tablet  Commonly known as:  GLUCOPHAGE  Take 500 mg by mouth 2 (two) times daily with a meal.     methocarbamol 500 MG tablet  Commonly known as:  ROBAXIN  Take 500 mg by mouth every 6 (six) hours as needed. For muscle spasms.     XARELTO 20 MG Tabs  Generic drug:  Rivaroxaban  Take 20 mg by mouth daily.       Exam:  Gen: paraplegic AA male who is obtunded and hypotensive. GU: hypospadic phallus with blood at the meatus and a 39fr 3 way foley that appears to not be all the way into the bladder.    Neuro: Obtunded, paraplegic.   Results for orders placed during the hospital encounter of 02/12/13 (from the past 24 hour(s))  CBC WITH DIFFERENTIAL     Status: Abnormal   Collection Time    02/12/13 10:55 AM      Result Value Range   WBC 19.6 (*) 4.0 - 10.5 K/uL   RBC 3.49 (*) 4.22 - 5.81 MIL/uL   Hemoglobin 8.3 (*) 13.0 - 17.0 g/dL   HCT 16.1  (*) 09.6 - 52.0 %   MCV 70.8 (*) 78.0 - 100.0 fL   MCH 23.8 (*) 26.0 - 34.0 pg   MCHC 33.6  30.0 - 36.0 g/dL   RDW 04.5 (*) 40.9 - 81.1 %   Platelets 456 (*) 150 - 400 K/uL   Neutrophils Relative % 74  43 - 77 %   Lymphocytes Relative 17  12 - 46 %   Monocytes Relative 5  3 - 12 %   Eosinophils Relative 3  0 - 5 %   Basophils Relative 1  0 - 1 %   Neutro Abs 14.5 (*) 1.7 - 7.7 K/uL   Lymphs Abs 3.3  0.7 - 4.0 K/uL   Monocytes Absolute 1.0  0.1 - 1.0 K/uL   Eosinophils Absolute 0.6  0.0 - 0.7 K/uL   Basophils Absolute 0.2 (*) 0.0 - 0.1 K/uL   RBC Morphology POLYCHROMASIA PRESENT     WBC Morphology WHITE COUNT CONFIRMED ON SMEAR     Smear Review PLATELET COUNT CONFIRMED BY SMEAR    BASIC METABOLIC PANEL     Status: Abnormal   Collection Time    02/12/13 10:55 AM      Result Value Range   Sodium 134 (*) 135 - 145 mEq/L   Potassium 3.5  3.5 - 5.1 mEq/L   Chloride 104  96 - 112 mEq/L   CO2 21  19 - 32 mEq/L   Glucose, Bld 134 (*) 70 - 99 mg/dL   BUN 12  6 - 23 mg/dL   Creatinine, Ser 9.14  0.50 - 1.35 mg/dL   Calcium 8.6  8.4 - 78.2 mg/dL   GFR calc non Af Amer >90  >90 mL/min   GFR calc Af Amer >90  >90 mL/min  TYPE AND SCREEN     Status: None   Collection Time    02/12/13 10:55 AM      Result Value Range   ABO/RH(D) O POS     Antibody Screen NEG     Sample Expiration 02/15/2013     Unit Number [id number]     Blood Component Type RED CELLS,LR     Unit division 00     Status of Unit ISSUED  Transfusion Status OK TO TRANSFUSE     Crossmatch Result Compatible     Unit Number [id number]     Blood Component Type RED CELLS,LR     Unit division 00     Status of Unit ISSUED     Transfusion Status OK TO TRANSFUSE     Crossmatch Result Compatible    LIPASE, BLOOD     Status: None   Collection Time    02/12/13 10:55 AM      Result Value Range   Lipase 38  11 - 59 U/L  PROTIME-INR     Status: Abnormal   Collection Time    02/12/13 10:55 AM      Result Value Range    Prothrombin Time 25.6 (*) 11.6 - 15.2 seconds   INR 2.43 (*) 0.00 - 1.49  PREPARE RBC (CROSSMATCH)     Status: None   Collection Time    02/12/13 11:30 AM      Result Value Range   Order Confirmation ORDER PROCESSED BY BLOOD BANK    PREPARE FRESH FROZEN PLASMA     Status: None   Collection Time    02/12/13 12:30 PM      Result Value Range   Unit Number [id number]     Blood Component Type THAWED PLASMA     Unit division 00     Status of Unit ALLOCATED     Transfusion Status OK TO TRANSFUSE     Unit Number [id number]     Blood Component Type THAWED PLASMA     Unit division 00     Status of Unit ALLOCATED     Transfusion Status OK TO TRANSFUSE     Labs reviewed and case discussed with Dr. Effie Shy.   He has given a reversal agent for the Xarelto.   Procedure:   I deflated the foley balloon and advanced it into the bladder.   The catheter could then be aspirated with an initial return of about 1000cc of bloody urine.   Further saline irrigation was done with about 3000cc with return of several small clots and eventual clearing of the urine.  The urine was light pink at the end of the effort and the foley was placed to drainage on traction with 30cc in the balloon.  There were no complications.   Imp:  MS with NGB and chronic foley.          Gross hematuria with clot retention from probably foley trauma and Xarelto.           Plan:  Foley to straight drainage with irrigation prn.           Dr. Berneice Heinrich notified of admission.   CC: Dr. Gray Bernhardt.

## 2013-02-12 NOTE — ED Notes (Signed)
For the past ~20 min. I have seen his rhythm go from nsr to sinus brady to tahycardia, with occasional periods of l.b.b.b. At a rate of ~80. As I write this, he in in nsr, with rate of ~78 and has just had a brief run of the l.b.b.b.  He remains drowsy, and answers all questions regarding his situation appropriately.  He is concerned about his wife; and has recently had a visit from their pastor, who will attempt to contact her.

## 2013-02-12 NOTE — ED Notes (Signed)
He, while at [facility] adult center, was noted to have decreased urine output.  Staff at [facility] replaced his foley catheter (today); after which they noted limited urine outflow, and his urine is grossly hematuric.  Pt. Is drowsy and minimally verbal upon arrival to my ED room, however, he becomes responsive and oriented with in 5 min. Of fluid bolus.  As he enters my room, I hng 3rd liter of IV saline.  He has bilat. anticubital IV's started by EMS.

## 2013-02-12 NOTE — H&P (Signed)
PULMONARY  / CRITICAL CARE MEDICINE  Name: [name] MRN: [id number] DOB: 1956-04-21    ADMISSION DATE:  02/12/2013 CONSULTATION DATE:  02/12/13  REFERRING MD :  Dr Effie Shy PRIMARY SERVICE: PCCM  CHIEF COMPLAINT:  Hemorrhagic shock  BRIEF PATIENT DESCRIPTION: 57 yo man w hx MM c/b T8 disease and subsequent cord compression w paraplegia. In remission with no MM treatment for a year. On xarelto since 05/2012 for B LE DVT (also w IVF filter). Has chronic indwelling foley and colostomy.  Underwent foley change 7/23 found to have gross hematuria with clots. In [facility] ED Hgb 8.3, shock. Received Kcentra, 2u PRBC, 3L IVF. Started on dopamine. PCCM asked to evaluate and admit.   SIGNIFICANT EVENTS / STUDIES:   LINES / TUBES: Hematuria foley (Dr Annabell Howells) 7/23 >>   CULTURES: Blood 7/23 >>  Urine 7/23 >>  resp 7/23 >>   ANTIBIOTICS:  HISTORY OF PRESENT ILLNESS:  57 yo man w hx MM c/b T8 disease and subsequent cord compression w paraplegia. In remission with no MM treatment for a year. On xarelto since 05/2012 for B LE DVT (also w IVF filter). Has chronic indwelling foley and colostomy.  Underwent foley change 7/23 found to have gross hematuria with clots. In [facility] ED Hgb 8.3, shock. Received Kcentra, 2u PRBC, 3L IVF. Started on dopamine. PCCM asked to evaluate and admit.   PAST MEDICAL HISTORY :  1. Multiple myeloma, IgG lambda, presenting in December 2011 with back  pain and leg weakness due to a cord compression at T8 which  ultimately resulted in paraplegia. The patient underwent biopsy  and cord decompression by Dr. Kerri Perches on 07/15/2010. He  underwent radiation treatments which concluded by 08/22/2010. The  patient received treatment with Revlimid, Decadron, and Velcade in  combination with Zometa. His initial IgG levels were as high as  5570 back in February 2012. Initial 24-hour urine protein came to  318 mg, positive for IgG heavy chains and lambda light chains. I  do not think the  patient ever had a bone marrow or a metastatic  bone survey; however, CT scans of abdomen and pelvis carried out on  12/09/2010 have shown myelomatous involvement of the bones. A CT  scan of the head on 05/05/2011 was normal. The patient has had an  outstanding response to treatment. IgG level from 09/11/2012 was  904. Serum immunofixation electrophoresis continues to show  monoclonal IgG lambda protein. It appears that Velcade was  discontinued in September 2012 and that Revlimid and Decadron were  discontinued in March 2013. 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 The patient is presently on no  specific treatment for his IgG lambda multiple myeloma.  2. Paraplegia which developed in December 2011. The patient is status  post surgical decompression of the T8 paraspinal mass by Dr. Maeola Harman. He is also status post radiation treatments.  3. Neurogenic bladder and bowel.  4. 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  An inferior vena cava filter had been placed on November 10, 2010.  The patient was started on Xarelto on 06/12/2012.  5. Placement of an IVC filter on 11/10/2010.  6. History of sigmoid diverticulitis with perforation in December 2012  resulting in a sigmoid colectomy and end-colostomy on 07/20/2011.  7. End-colostomy.  8. History of C. difficile.  9. Adrenal insufficiency.  10. Diabetes mellitus.  11. History  of cataracts, status post surgery.  12. Cholelithiasis noted on CT scan of abdomen and pelvis from 06/24/2012.  13. Vitamin D deficiency noted on 09/11/2012 when vitamin D level was  14. Hypoalbuminemia.  15. Anemia noted 09/11/2012 with hemoglobin 9.3, hematocrit 28.3, and  low red cell indices, suspected to be due to iron deficiency    Prior to Admission medications   Medication Sig Start Date End Date Taking? Authorizing Provider   acetaminophen (TYLENOL) 325 MG tablet Take 325-650 mg by mouth every 6 (six) hours as needed. For pain.     Historical Provider, MD  chlorhexidine (PERIDEX) 0.12 % solution Use as directed 15 mLs in the mouth or throat 5 (five) times daily.    Historical Provider, MD  cholecalciferol (VITAMIN D) 1000 UNITS tablet Take 1,000 Units by mouth daily.    Historical Provider, MD  dexamethasone (DECADRON) 0.75 MG tablet Take 0.75 mg by mouth. Two tabs QOD on even days, 1 tab QOD on odd days    Historical Provider, MD  finasteride (PROSCAR) 5 MG tablet Take 5 mg by mouth daily.    Historical Provider, MD  latanoprost (XALATAN) 0.005 % ophthalmic solution Place 1 drop into both eyes at bedtime.    Historical Provider, MD  metFORMIN (GLUCOPHAGE) 500 MG tablet Take 500 mg by mouth 2 (two) times daily with a meal.    Historical Provider, MD  methocarbamol (ROBAXIN) 500 MG tablet Take 500 mg by mouth every 6 (six) hours as needed. For muscle spasms. 10/03/10   Historical Provider, MD  Rivaroxaban (XARELTO) 20 MG TABS Take 20 mg by mouth daily.    Historical Provider, MD   Not on File  FAMILY HISTORY:  History reviewed. No pertinent family history. SOCIAL HISTORY:  reports that he has never smoked. He has never used smokeless tobacco. He reports that he does not drink alcohol or use illicit drugs.  REVIEW OF SYSTEMS:  He reports that he has felt well until this am, no new sx. Cannot move anything below waist, has occasional phantom sensation. No fevers.  Hematuria noted   SUBJECTIVE:  Feels weak, no pain or other complaints  VITAL SIGNS: Temp:  [97 F (36.1 C)-97.4 F (36.3 C)] 97 F (36.1 C) (07/23 1300) Pulse Rate:  [51-107] 107 (07/23 1330) Resp:  [11-18] 16 (07/23 1245) BP: (58-124)/(46-82) 85/56 mmHg (07/23 1336) SpO2:  [93 %-100 %] 94 % (07/23 1259) Weight:  [120.203 kg (265 lb)-122.471 kg (270 lb)] 122.471 kg (270 lb) (07/23 1211) HEMODYNAMICS:   VENTILATOR SETTINGS:   INTAKE /  OUTPUT: Intake/Output     07/22 0701 - 07/23 0700 07/23 0701 - 07/24 0700   I.V. (mL/kg)  250 (2)   Blood  312.5   Total Intake(mL/kg)  562.5 (4.6)   Net   +562.5          PHYSICAL EXAMINATION: General:  debilitated man, laying comfortably Neuro:  No LE movement or sensation, able to move B UE, follows commands and interacts HEENT:  OP clear,  Cardiovascular:  Borderline tachy, no MRG Lungs:  Clear B  Abdomen:  Soft, NT, colostomy OK, + BS Musculoskeletal: some LE contracture Skin:  No breakdown or ulcers    Recent Labs Lab 02/12/13 1055  HGB 8.3*  HCT 24.7*  WBC 19.6*  PLT 456*    Recent Labs Lab 02/12/13 1055  NA 134*  K 3.5  CL 104  CO2 21  GLUCOSE 134*  BUN 12  CREATININE 0.90  CALCIUM 8.6  Recent Labs Lab 02/12/13 1055  INR 2.43*    CXR: none  ASSESSMENT / PLAN:  CARDIOVASCULAR A: shock, suspect hemorrhagic but consider occult infection and sepsis. Also note hx adrenal insuff and dexamethasone use P:  - volume resuscitation and PRBC's ordered in ED - large bore IVs in place - dopamine weaning down with volume - if unable to wean dopa to off then will place CVC to follow CVP's - pan-cx and check Pct - defer abx for now in light of alternative explanation for hypotension; low threshold to start - screen troponins  HEMATOLOGIC A:  Acute blood loss, hematuria Multiple myeloma, in remission. Known bony disease Coagulopathy on xarelto (for LE DVT's, noted while on coumadin. IVC filter in place) P:  - PRBC 2u ordered - follow serial CBC and degree hematuria - Kcentra given in ED, ? Efficacy in reversal of xarelto - hold all anticoag  UROLOGICAL A: Hematuria on xarelto, unclear whether this was de novo or related to foley manipulation.  Neurogenic bladder, chronic foley w hypospadias  P: - Dr Annabell Howells to place irrigation foley 7/23, appreciate his consultation  INFECTIOUS A:  No obvious source infxn Leukocytosis  P:   - blood, urine,  resp cx's - CXR - check Pct - defer abx but low threshold to start if clinical change  PULMONARY A: Some clinical evidence for OSA, no definitive dx P:   - consider empiric CPAP depending on clinical course  RENAL A:  No acute issues P:   - follow BMP  GASTROINTESTINAL A:  Hx neurogenic colon P:   - standard ostomy care  ENDOCRINE A:  Adrenal insufficiency on chronic dexamethasone   DM P:   - start hydrocort 50mg  q6h until BP stabilizes - hold metformin, start SSI protocol  NEUROLOGIC A:  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  CRITICAL CARE: The patient is critically ill with multiple organ systems failure and requires high complexity decision making for assessment and support, frequent evaluation and titration of therapies, application of advanced monitoring technologies and extensive interpretation of multiple databases. Critical Care Time devoted to patient care services described in this note is 60 minutes.    Levy Pupa, MD, PhD 02/12/2013, 2:23 PM [facility] [phone] or if no answer [phone]

## 2013-02-13 DIAGNOSIS — I959 Hypotension, unspecified: Secondary | ICD-10-CM

## 2013-02-13 DIAGNOSIS — N319 Neuromuscular dysfunction of bladder, unspecified: Secondary | ICD-10-CM

## 2013-02-13 LAB — BASIC METABOLIC PANEL
BUN: 6 mg/dL (ref 6–23)
CO2: 22 mEq/L (ref 19–32)
Calcium: 8.7 mg/dL (ref 8.4–10.5)
Chloride: 109 mEq/L (ref 96–112)
Creatinine, Ser: 0.58 mg/dL (ref 0.50–1.35)
GFR calc Af Amer: >90 mL/min (ref >90)
GFR calc non Af Amer: >90 mL/min (ref >90)
Glucose, Bld: 205 mg/dL — ABNORMAL HIGH (ref 70–99)
Potassium: 3.9 mEq/L (ref 3.5–5.1)
Sodium: 139 mEq/L (ref 135–145)

## 2013-02-13 LAB — URINALYSIS, ROUTINE W REFLEX MICROSCOPIC
Bilirubin Urine: NEGATIVE
Glucose, UA: NEGATIVE mg/dL
Ketones, ur: NEGATIVE mg/dL
Nitrite: NEGATIVE
Protein, ur: NEGATIVE mg/dL
Specific Gravity, Urine: 1.017 (ref 1.005–1.030)
Urobilinogen, UA: 0.2 mg/dL (ref 0.0–1.0)
pH: 5 (ref 5.0–8.0)

## 2013-02-13 LAB — URINE CULTURE
Colony Count: NO GROWTH
Culture: NO GROWTH

## 2013-02-13 LAB — TYPE AND SCREEN
ABO/RH(D): O POS
Antibody Screen: NEGATIVE
Unit division: 0
Unit division: 0

## 2013-02-13 LAB — PHOSPHORUS: Phosphorus: 2.1 mg/dL — ABNORMAL LOW (ref 2.3–4.6)

## 2013-02-13 LAB — GLUCOSE, CAPILLARY
Glucose-Capillary: 195 mg/dL — ABNORMAL HIGH (ref 70–99)
Glucose-Capillary: 112 mg/dL — ABNORMAL HIGH (ref 70–99)
Glucose-Capillary: 209 mg/dL — ABNORMAL HIGH (ref 70–99)
Glucose-Capillary: 134 mg/dL — ABNORMAL HIGH (ref 70–99)

## 2013-02-13 LAB — URINE MICROSCOPIC-ADD ON

## 2013-02-13 LAB — CBC
HCT: 25.5 % — ABNORMAL LOW (ref 39.0–52.0)
Hemoglobin: 9 g/dL — ABNORMAL LOW (ref 13.0–17.0)
MCH: 26 pg (ref 26.0–34.0)
MCHC: 35.3 g/dL (ref 30.0–36.0)
MCV: 73.7 fL — ABNORMAL LOW (ref 78.0–100.0)
Platelets: 330 10*3/uL (ref 150–400)
RBC: 3.46 MIL/uL — ABNORMAL LOW (ref 4.22–5.81)
RDW: 19.8 % — ABNORMAL HIGH (ref 11.5–15.5)
WBC: 19.3 10*3/uL — ABNORMAL HIGH (ref 4.0–10.5)

## 2013-02-13 LAB — TROPONIN I: Troponin I: <0.3 ng/mL (ref <0.30)

## 2013-02-13 LAB — MAGNESIUM: Magnesium: 1.3 mg/dL — ABNORMAL LOW (ref 1.5–2.5)

## 2013-02-13 LAB — PROCALCITONIN: Procalcitonin: 0.11 ng/mL

## 2013-02-13 MED ORDER — FINASTERIDE 5 MG PO TABS
5.0000 mg | ORAL_TABLET | Freq: Every day | ORAL | Status: DC
  Administered 2013-02-13 – 2013-02-17 (×5): 5 mg via ORAL
  Filled 2013-02-13 (×5): qty 1

## 2013-02-13 NOTE — Progress Notes (Signed)
Patient has good support from [place]. [employer]

## 2013-02-13 NOTE — Progress Notes (Signed)
CARE MANAGEMENT NOTE 02/13/2013  Patient:  DORNELL, GRASMICK   Account Number:  [id number]  Date Initiated:  02/13/2013  Documentation initiated by:  Ellieana Dolecki  Subjective/Objective Assessment:   pt hx of ms with paraplegia,neurogenic bladder and bowels,admitted due to hypotensive episodes,     Action/Plan:   lives at home with spouse and participates in the P[facility] program.   Anticipated DC Date:  02/16/2013   Anticipated DC Plan:  HOME W HOME HEALTH SERVICES  In-house referral  Clinical Social Worker      DC Planning Services  CM consult  PACE      [facility] Choice  Resumption Of Svcs/PTA Provider   Choice offered to / List presented to:  NA      DME agency  NA     HH arranged  NA      HH agency  NA   Status of service:  In process, will continue to follow Medicare Important Message given?  NA - LOS <3 / Initial given by admissions (If response is "NO", the following Medicare IM given date fields will be blank) Date Medicare IM given:   Date Additional Medicare IM given:    Discharge Disposition:    Per UR Regulation:  Reviewed for med. necessity/level of care/duration of stay  If discussed at Long Length of Stay Meetings, dates discussed:    Comments:  [id number] Earlene Plater RN, BSN, CCN: [phone] Case management. Chart reviewed for discharge planning and present needs. Discharge needs: none present at time of review. Next chart review due:  [id number]

## 2013-02-13 NOTE — Progress Notes (Signed)
PULMONARY  / CRITICAL CARE MEDICINE  Name: [name] MRN: [id number] DOB: 12/20/1955    ADMISSION DATE:  02/12/2013 CONSULTATION DATE:  02/12/13  REFERRING MD :  Dr Effie Shy PRIMARY SERVICE: PCCM  CHIEF COMPLAINT:  Hemorrhagic shock  BRIEF PATIENT DESCRIPTION: 57 y/o man w hx MM c/b T8 disease and subsequent cord compression w paraplegia. In remission with no MM treatment for a year. On xarelto since 05/2012 for B LE DVT (also w IVF filter). Has chronic indwelling foley and colostomy.  Underwent foley change 7/23 found to have gross hematuria with clots. In [facility] ED Hgb 8.3, shock. Received Kcentra, 2u PRBC, 3L IVF. Started on dopamine. PCCM asked to evaluate and admit.   SIGNIFICANT EVENTS:  7/23 - Admit with gross hematuria on xarelto, hypotension  STUDIES:  LINES / TUBES: Hematuria foley (Dr Annabell Howells) 7/23 >>   CULTURES: Blood 7/23 >>  Urine 7/23 >>  resp 7/23 >>   ANTIBIOTICS:  SUBJECTIVE:  No acute complaints.  Feels better than yesterday.  Hematuria resolved but remains on dopamine.   VITAL SIGNS: Temp:  [97 F (36.1 C)-99.1 F (37.3 C)] 98.8 F (37.1 C) (07/24 0800) Pulse Rate:  [51-140] 68 (07/24 0945) Resp:  [11-22] 17 (07/24 0945) BP: (58-181)/(30-82) 87/52 mmHg (07/24 0945) SpO2:  [93 %-100 %] 99 % (07/24 0945) Weight:  [264 lb 12.4 oz (120.1 kg)-270 lb (122.471 kg)] 264 lb 12.4 oz (120.1 kg) (07/24 0300) Room Air  INTAKE / OUTPUT: Intake/Output     07/23 0701 - 07/24 0700 07/24 0701 - 07/25 0700   P.O. 300    I.V. (mL/kg) 1381.3 (11.5) 150 (1.2)   Blood 640    IV Piggyback 50    Total Intake(mL/kg) 2371.3 (19.7) 150 (1.2)   Urine (mL/kg/hr) 2750 200 (0.5)   Total Output 2750 200   Net -378.7 -50        Stool Occurrence 1 x      PHYSICAL EXAMINATION: General:  debilitated man, laying comfortably in bed Neuro:  No LE movement or sensation, able to move B UE, follows commands and interacts HEENT:  OP clear,  Cardiovascular:  s1s2 rrr, no MRG Lungs:   Clear B  Abdomen:  Soft, NT, colostomy OK, + BS Musculoskeletal: some LE contracture Skin:  No breakdown or ulcers  Labs: CBC Recent Labs     02/12/13  1055  02/12/13  2325  02/13/13  0525  WBC  19.6*  20.1*  19.3*  HGB  8.3*  9.0*  9.0*  HCT  24.7*  25.9*  25.5*  PLT  456*  336  330    Coag's Recent Labs     02/12/13  1055  INR  2.43*    BMET Recent Labs     02/12/13  1055  02/13/13  0525  NA  134*  139  K  3.5  3.9  CL  104  109  CO2  21  22  BUN  12  6  CREATININE  0.90  0.58  GLUCOSE  134*  205*    Electrolytes Recent Labs     02/12/13  1055  02/13/13  0525  CALCIUM  8.6  8.7  MG   --   1.3*  PHOS   --   2.1*    Sepsis Markers Recent Labs     02/12/13  1755  02/13/13  0525  PROCALCITON  <0.10  0.11    Cardiac Enzymes Recent Labs     02/12/13  1754  02/12/13  2325  02/13/13  0525  TROPONINI  <0.30  <0.30  <0.30    Glucose Recent Labs     02/12/13  1757  02/12/13  2156  02/13/13  0759  GLUCAP  118*  178*  195*    Imaging Dg Chest Port 1 View  02/12/2013   *RADIOLOGY REPORT*  Clinical Data: History of multiple myeloma.  Shock.  PORTABLE CHEST - 1 VIEW  Comparison: 08/08/2011  Findings: The cardiac silhouette is normal in size.  No mediastinal or hilar masses are noted.  The lungs are clear allowing for rotation.  No gross pneumothorax or pleural effusion on this supine/Trendelenburg exam.  In mid thoracic spine fusion has been performed, stable from the prior exam.  IMPRESSION: No acute cardiopulmonary disease.   Original Report Authenticated By: Amie Portland, M.D.    ASSESSMENT / PLAN:  CARDIOVASCULAR A:  Shock - suspect hemorrhagic. Also note hx adrenal insuff and dexamethasone use.  Troponin negative x3.  P:  - wean dopamine to off with MAP >60 or SBP >90 - if unable to wean dopa to off, consider CVC to follow CVP's - defer abx for now in light of alternative explanation for hypotension  HEMATOLOGIC A:   Acute blood loss  - hematuria post foley replacement.  S/p 2 units PRBC's 7/23 Multiple myeloma - in remission. Known bony disease Coagulopathy on xarelto - for LE DVT's, noted while on coumadin. IVC filter in place P:  - follow serial CBC and degree hematuria - hold anticoagulation for now  RENAL A:  Hematuria - on xarelto; likely related to foley manipulation.  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  Clinically improved and looks better than would be indicated by blood pressure readings.  Will continue to monitor Hb.  Try to wean of DA to keep SBP > 90, MAP > 60.  Continue solu cortef for hx of adrenal insufficiency.  Hematuria much improved >> will need to decide when to resume anticoagulation for hx of DVT in setting of MM and paraplegia.  Coralyn Helling, MD [facility] Pulmonary/Critical Care 02/13/2013, 11:01 AM Pager:  [phone] After 3pm call: [phone]

## 2013-02-13 NOTE — Progress Notes (Signed)
Subjective:  1 - Hematuria / Catheter Trauma - pt with presentation 7/23 to [facility] ER with gross hematuria with clots via chronic foley after minor trauma earlier in day, attempted replacement resulted in further trauma and then replaced into adequate position and bladder irrigated to clear. On Xarelto at baseline, now being held. Also known h/o prostatic bleeding improved on finasteride.  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  Objective: Vital signs in last 24 hours: Temp:  [97.9 F (36.6 C)-99.1 F (37.3 C)] 98.3 F (36.8 C) (07/24 1200) Pulse Rate:  [54-140] 90 (07/24 1515) Resp:  [13-26] 17 (07/24 1515) BP: (68-182)/(30-82) 110/72 mmHg (07/24 1515) SpO2:  [95 %-100 %] 99 % (07/24 1515) Weight:  [120.1 kg (264 lb 12.4 oz)] 120.1 kg (264 lb 12.4 oz) (07/24 0300) Last BM Date: 02/12/13  Intake/Output from previous day: 07/23 0701 - 07/24 0700 In: 2371.3 [P.O.:300; I.V.:1381.3; Blood:640; IV Piggyback:50] Out: 2750 [Urine:2750] Intake/Output this shift: Total I/O In: 682.4 [I.V.:682.4] Out: 200 [Urine:200]  General appearance: alert, cooperative, appears stated age and family at bedside Head: Normocephalic, without obvious abnormality, atraumatic Eyes: conjunctivae/corneas clear. PERRL, EOM's intact. Fundi benign. Ears: normal TM's and external ear canals both ears Nose: Nares normal. Septum midline. Mucosa normal. No drainage or sinus tenderness. Throat: lips, mucosa, and tongue normal; teeth and gums normal Neck: no adenopathy, no carotid bruit, no JVD, supple, symmetrical, trachea midline and thyroid not enlarged, symmetric, no tenderness/mass/nodules Back: symmetric, no curvature. ROM normal. No CVA tenderness. Resp: clear to auscultation bilaterally Chest wall: no tenderness Cardio: regular rate and rhythm, S1, S2 normal, no murmur, click, rub or gallop GI: soft, non-tender; bowel sounds normal; no masses,  no  organomegaly and truncal obesity Male genitalia: mild trauamtic hypospaidas with foley c/d/i with only light pink tinged urine, no clots. Extremities: Bilat UE and LE contracturs as per baseline w/o acute ulceration. Pulses: 2+ and symmetric Skin: Skin color, texture, turgor normal. No rashes or lesions Lymph nodes: Cervical, supraclavicular, and axillary nodes normal. Neurologic: Grossly normal  Lab Results:   Recent Labs  02/12/13 2325 02/13/13 0525  WBC 20.1* 19.3*  HGB 9.0* 9.0*  HCT 25.9* 25.5*  PLT 336 330   BMET  Recent Labs  02/12/13 1055 02/13/13 0525  NA 134* 139  K 3.5 3.9  CL 104 109  CO2 21 22  GLUCOSE 134* 205*  BUN 12 6  CREATININE 0.90 0.58  CALCIUM 8.6 8.7   PT/INR  Recent Labs  02/12/13 1055  LABPROT 25.6*  INR 2.43*   ABG No results found for this basename: PHART, PCO2, PO2, HCO3,  in the last 72 hours  Studies/Results: Dg Chest Port 1 View  02/12/2013   *RADIOLOGY REPORT*  Clinical Data: History of multiple myeloma.  Shock.  PORTABLE CHEST - 1 VIEW  Comparison: 08/08/2011  Findings: The cardiac silhouette is normal in size.  No mediastinal or hilar masses are noted.  The lungs are clear allowing for rotation.  No gross pneumothorax or pleural effusion on this supine/Trendelenburg exam.  In mid thoracic spine fusion has been performed, stable from the prior exam.  IMPRESSION: No acute cardiopulmonary disease.   Original Report Authenticated By: Amie Portland, M.D.    Anti-infectives: Anti-infectives   None      Assessment/Plan: 1 - Hematuria / Catheter Trauma - Improved with replacement / irrigation. I dicussed with family changes in longterm management including transition to SP Tube to help avoid irritation  of prostatic fossa and to hopefully make catheter changes easier. They will consider.   2 - Neurogenic Bladder - Continue foley for now, may eventually transition to SPT as per above.  3 - Will follow.  [facility],  Darnell Jeschke 02/13/2013

## 2013-02-14 ENCOUNTER — Inpatient Hospital Stay (HOSPITAL_COMMUNITY): Payer: Medicare (Managed Care)

## 2013-02-14 DIAGNOSIS — D649 Anemia, unspecified: Secondary | ICD-10-CM

## 2013-02-14 DIAGNOSIS — C9001 Multiple myeloma in remission: Secondary | ICD-10-CM

## 2013-02-14 DIAGNOSIS — Z7901 Long term (current) use of anticoagulants: Secondary | ICD-10-CM

## 2013-02-14 LAB — PREPARE FRESH FROZEN PLASMA
Unit division: 0
Unit division: 0

## 2013-02-14 LAB — CBC
HCT: 22.1 % — ABNORMAL LOW (ref 39.0–52.0)
Hemoglobin: 7.8 g/dL — ABNORMAL LOW (ref 13.0–17.0)
MCH: 26.4 pg (ref 26.0–34.0)
MCHC: 35.3 g/dL (ref 30.0–36.0)
MCV: 74.9 fL — ABNORMAL LOW (ref 78.0–100.0)
Platelets: 299 10*3/uL (ref 150–400)
RBC: 2.95 MIL/uL — ABNORMAL LOW (ref 4.22–5.81)
RDW: 20.6 % — ABNORMAL HIGH (ref 11.5–15.5)
WBC: 15.1 10*3/uL — ABNORMAL HIGH (ref 4.0–10.5)

## 2013-02-14 LAB — BASIC METABOLIC PANEL
BUN: 8 mg/dL (ref 6–23)
CO2: 23 mEq/L (ref 19–32)
Calcium: 8.4 mg/dL (ref 8.4–10.5)
Chloride: 111 mEq/L (ref 96–112)
Creatinine, Ser: 0.64 mg/dL (ref 0.50–1.35)
GFR calc Af Amer: >90 mL/min (ref >90)
GFR calc non Af Amer: >90 mL/min (ref >90)
Glucose, Bld: 127 mg/dL — ABNORMAL HIGH (ref 70–99)
Potassium: 3.7 mEq/L (ref 3.5–5.1)
Sodium: 142 mEq/L (ref 135–145)

## 2013-02-14 LAB — GLUCOSE, CAPILLARY
Glucose-Capillary: 104 mg/dL — ABNORMAL HIGH (ref 70–99)
Glucose-Capillary: 117 mg/dL — ABNORMAL HIGH (ref 70–99)
Glucose-Capillary: 140 mg/dL — ABNORMAL HIGH (ref 70–99)
Glucose-Capillary: 137 mg/dL — ABNORMAL HIGH (ref 70–99)
Glucose-Capillary: 169 mg/dL — ABNORMAL HIGH (ref 70–99)

## 2013-02-14 LAB — HEMOGLOBIN AND HEMATOCRIT, BLOOD
HCT: 21.8 % — ABNORMAL LOW (ref 39.0–52.0)
Hemoglobin: 7.6 g/dL — ABNORMAL LOW (ref 13.0–17.0)

## 2013-02-14 MED ORDER — HYDROCORTISONE 10 MG PO TABS
10.0000 mg | ORAL_TABLET | Freq: Two times a day (BID) | ORAL | Status: DC
  Administered 2013-02-14 – 2013-02-17 (×7): 10 mg via ORAL
  Filled 2013-02-14 (×9): qty 1

## 2013-02-14 MED ORDER — METFORMIN HCL 500 MG PO TABS
500.0000 mg | ORAL_TABLET | Freq: Two times a day (BID) | ORAL | Status: DC
  Administered 2013-02-14 – 2013-02-17 (×6): 500 mg via ORAL
  Filled 2013-02-14 (×10): qty 1

## 2013-02-14 NOTE — Progress Notes (Signed)
Elink MD made aware of Hgb drop from 9 to 7.8 this am. VSS will continue to monitor. Carlos Levering

## 2013-02-14 NOTE — Progress Notes (Signed)
PULMONARY  / CRITICAL CARE MEDICINE  Name: [name] MRN: [id number] DOB: Oct 13, 1955    ADMISSION DATE:  02/12/2013 CONSULTATION DATE:  02/12/13  REFERRING MD :  Dr Effie Shy PRIMARY SERVICE: PCCM  CHIEF COMPLAINT:  Hemorrhagic shock  BRIEF PATIENT DESCRIPTION: 57 y/o man w hx MM c/b T8 disease and subsequent cord compression w paraplegia. In remission with no MM treatment for a year. On xarelto since 05/2012 for B LE DVT (also w IVF filter). Has chronic indwelling foley and colostomy.  Underwent foley change 7/23 found to have gross hematuria with clots. In [facility] ED Hgb 8.3, shock. Received Kcentra, 2u PRBC, 3L IVF. Started on dopamine. PCCM asked to evaluate and admit.   SIGNIFICANT EVENTS:  7/23 - Admit with gross hematuria on xarelto, hypotension 7/24 - Off dopamine  STUDIES:  LINES / TUBES: Hematuria foley (Dr Annabell Howells) 7/23 >>   CULTURES: Blood 7/23 >>  Urine 7/23 >> negative  ANTIBIOTICS:  SUBJECTIVE:  Feels better than yesterday.  Denies chest pain, dyspnea.  VITAL SIGNS: Temp:  [97.7 F (36.5 C)-98.6 F (37 C)] 97.9 F (36.6 C) (07/25 0825) Pulse Rate:  [50-108] 81 (07/25 0800) Resp:  [11-26] 14 (07/25 1100) BP: (68-182)/(41-82) 104/55 mmHg (07/25 0840) SpO2:  [96 %-100 %] 98 % (07/25 0800) Weight:  [270 lb 4.5 oz (122.6 kg)] 270 lb 4.5 oz (122.6 kg) (07/25 0400) Room Air  INTAKE / OUTPUT: Intake/Output     07/24 0701 - 07/25 0700 07/25 0701 - 07/26 0700   P.O.  240   I.V. (mL/kg) 1938.5 (15.8) 300 (2.4)   Blood     Other 10    IV Piggyback     Total Intake(mL/kg) 1948.5 (15.9) 540 (4.4)   Urine (mL/kg/hr) 1040 (0.4) 150 (0.3)   Stool 1 (0)    Total Output 1041 150   Net +907.5 +390        Stool Occurrence 1 x 1 x     PHYSICAL EXAMINATION: General:  debilitated man, laying comfortably in bed Neuro:  No LE movement or sensation, able to move B UE, follows commands and interacts HEENT:  OP clear,  Cardiovascular:  s1s2 rrr, no MRG Lungs:  Clear B   Abdomen:  Soft, NT, colostomy OK, + BS Musculoskeletal: some LE contracture Skin:  No breakdown or ulcers  Labs: CBC Recent Labs     02/12/13  2325  02/13/13  0525  02/14/13  0352  WBC  20.1*  19.3*  15.1*  HGB  9.0*  9.0*  7.8*  HCT  25.9*  25.5*  22.1*  PLT  336  330  299    Coag's Recent Labs     02/12/13  1055  INR  2.43*    BMET Recent Labs     02/12/13  1055  02/13/13  0525  02/14/13  0352  NA  134*  139  142  K  3.5  3.9  3.7  CL  104  109  111  CO2  21  22  23   BUN  12  6  8   CREATININE  0.90  0.58  0.64  GLUCOSE  134*  205*  127*    Electrolytes Recent Labs     02/12/13  1055  02/13/13  0525  02/14/13  0352  CALCIUM  8.6  8.7  8.4  MG   --   1.3*   --   PHOS   --   2.1*   --  Sepsis Markers Recent Labs     02/12/13  1755  02/13/13  0525  PROCALCITON  <0.10  0.11    Cardiac Enzymes Recent Labs     02/12/13  1754  02/12/13  2325  02/13/13  0525  TROPONINI  <0.30  <0.30  <0.30    Glucose Recent Labs     02/12/13  2156  02/13/13  0759  02/13/13  1145  02/13/13  1705  02/13/13  2152  02/14/13  0735  GLUCAP  178*  195*  112*  209*  134*  104*    Imaging Dg Chest Port 1 View  02/14/2013   *RADIOLOGY REPORT*  Clinical Data: Shortness of breath  PORTABLE CHEST - 1 VIEW  Comparison: Prior radiograph from 02/12/2013  Findings: Cardiac silhouette and mediastinal contours are stable, and remain within normal limits.  The lungs are clear.  Previously seen patchy opacity overlying the left heart border and lingula has resolved.  There is no pleural effusion.  Sequelae of prior spinal fusion is again noted.  No acute osseous abnormality.  IMPRESSION: No acute cardiopulmonary process identified.  Interval improvement in aeration of the left lung base.   Original Report Authenticated By: Rise Mu, M.D.   Dg Chest Port 1 View  02/12/2013   *RADIOLOGY REPORT*  Clinical Data: History of multiple myeloma.  Shock.  PORTABLE CHEST -  1 VIEW  Comparison: 08/08/2011  Findings: The cardiac silhouette is normal in size.  No mediastinal or hilar masses are noted.  The lungs are clear allowing for rotation.  No gross pneumothorax or pleural effusion on this supine/Trendelenburg exam.  In mid thoracic spine fusion has been performed, stable from the prior exam.  IMPRESSION: No acute cardiopulmonary disease.   Original Report Authenticated By: Amie Portland, M.D.    ASSESSMENT / PLAN:  CARDIOVASCULAR A:  Shock - suspect hemorrhagic. Also note hx adrenal insuff and dexamethasone use.  Off pressors 7/24. P:  -even fluid balance >> continue IV fluids for now  HEMATOLOGIC A:   Acute blood loss - hematuria post foley replacement.  S/p 2 units PRBC's 7/23 Multiple myeloma - in remission. Known bony disease Coagulopathy on xarelto - for LE DVT's, noted while on coumadin. IVC filter in place P:  - follow serial CBC and degree hematuria - hold anticoagulation for now >> if hemoglobin stable, then consider xarelto resuming 7/26  RENAL A:  Hematuria - on xarelto; likely related to foley manipulation.  Neurogenic bladder - chronic foley w hypospadias  P: - continue foley (placed by urology 7/23)  INFECTIOUS A:   No evidence for infection. 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  Likely ready for d/c home in next 24 to 48 hrs.  Keep on PCCM service.  Coralyn Helling, MD [facility] Pulmonary/Critical Care 02/14/2013, 11:10 AM Pager:  [phone] After 3pm call: [phone]

## 2013-02-14 NOTE — Discharge Summary (Signed)
Physician Discharge Summary  Patient ID: [name] MRN: [id number] DOB/AGE: September 16, 1955 57 y.o.  Admit date: 02/12/2013 Discharge date: 02/17/2013    Discharge Diagnoses:  Hemorrhagic Shock Acute Blood Loss Anemia Multiple Myeloma Coagulopathy Hematuria Neurogenic Bladder ? OSA Neurogenic Colon Adrenal Insufficiency DM Paraplegia                                                                      DISCHARGE PLAN BY DIAGNOSIS     Shock - suspect hemorrhagic. Also note hx adrenal insuff and dexamethasone use. Off pressors 7/24.   Discharge Plan:  none   Acute blood loss - hematuria post foley replacement. S/p 2 units PRBC's 7/23  Multiple myeloma - in remission. Known bony disease  Coagulopathy on xarelto - for LE DVT's, noted while on coumadin. IVC filter in place   Discharge Plan: - needs f/u CBC  - hold anticoagulation for now >> if hemoglobin stable, and blood clots clear he could resume xarelto as discretion of his PCP.   Hematuria - while on xarelto; likely related to foley manipulation.  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 y/o male with a PMH of multiple myeloma complicated by T8 disease and subsequent cord compression with paraplegia. He has been in remission with no multiple myeloma treatment for a year. In November of 2013, he was diagnosed with bilateral DVT and was treated with Xarelto & IVC filter placement.  At baseline he has a chronic indwelling foley  and colostomy.  On 7/23 he underwent foley change and post procedure was found to have gross hematuria with clots.  He presented to [facility] ER with shock and Hgb 8.3. Patient was treated with Kcentra, 2u PRBC, dopamine and 3L IVF. Xarelto was held on admission.  Urology was consulted for hematuria and a murphy gtt was instituted with clearing of hematuria.  Urology suggested that he eventually transition to SP Tube to help avoid irritation of prostatic fossa.  He has known adrenal insufficiency that required stress dose steroids & dopamine gtt for hypotension.  Dopamine gtt was weaned to off and he was transitioned to oral cortef prior to discharge.  Secondary to his body habitus and sleep questions, it is suspected that he has undiagnosed sleep apnea.  At time of d/c his hgb has stabilized but still has some clots passing in urine.               SIGNIFICANT EVENTS:  7/23 - Admit with gross hematuria on xarelto, hypotension  7/24 - Off dopamine    STUDIES:  LINES / TUBES:  Hematuria foley (Dr Annabell Howells) 7/23 >>  CULTURES:  Blood 7/23 >> neg Urine 7/23 >> negative   ANTIBIOTICS: None   Discharge Exam: General: debilitated man, laying comfortably in bed  Neuro: No LE movement or sensation, able to move B UE, follows commands and interacts  HEENT: OP clear,  Cardiovascular: s1s2 rrr, no MRG  Lungs: Clear B  Abdomen: Soft, NT, colostomy OK, + BS  Musculoskeletal: some LE contracture  Skin: No breakdown or ulcers   Filed Vitals:   02/16/13 0644 02/16/13 1455 02/16/13 2048 02/17/13 0510  BP: 128/70 126/69 139/73 132/73  Pulse: 69 79 75 68  Temp: 98.6 F (37 C) 98.7 F (37.1 C) 99 F (37.2 C) 98.4 F (36.9 C)  TempSrc: Oral Oral Oral Oral  Resp: 20 18 18 18   Height: 5\' 8"  (1.727 m)     Weight: 130.182 kg (287 lb)   131.452 kg (289 lb 12.8 oz)  SpO2: 100% 100% 100% 100%     Discharge Labs  BMET  Recent Labs Lab 02/12/13 1055 02/13/13 0525 02/14/13 0352  02/15/13 0343 02/16/13 0410  NA 134* 139 142 143 143  K 3.5 3.9 3.7 3.2* 3.5  CL 104 109 111 111 112  CO2 21 22 23 24 22   GLUCOSE 134* 205* 127* 104* 112*  BUN 12 6 8 11 9   CREATININE 0.90 0.58 0.64 0.62 0.63  CALCIUM 8.6 8.7 8.4 8.3* 8.4  MG  --  1.3*  --   --   --   PHOS  --  2.1*  --   --   --     CBC  Recent Labs Lab 02/15/13 0343 02/16/13 0410 02/17/13 1205  HGB 7.3* 8.1* 8.4*  HCT 21.3* 23.7* 25.0*  WBC 16.8* 16.2* 18.7*  PLT 314 359 388    Anti-Coagulation  Recent Labs Lab 02/12/13 1055  INR 2.43*        Discharge Orders   Future Appointments Provider Department Dept Phone   03/18/2013 9:00 AM [facility] [phone]   03/18/2013 9:30 AM [facility] Covering Provider 1 [place] [facility] [phone]   03/18/2013 11:00 AM [facility] [phone]   04/14/2013 3:30 PM Coralyn Helling, MD [place] [facility] [phone]   Future Orders Complete By Expires     Diet - low sodium heart healthy  As directed     Discharge instructions  As directed     Comments:      Discuss with your primary doctor timing of resuming xarelto. Would not resume this until blood clots completely resolved    Increase activity slowly  As directed           Medication List    STOP taking these medications       ROCEPHIN 1 G injection  Generic drug:  cefTRIAXone     sulfamethoxazole-trimethoprim 800-160 MG per tablet  Commonly known as:  BACTRIM DS,SEPTRA DS     XARELTO 20 MG Tabs  Generic drug:  Rivaroxaban      TAKE these medications       acetaminophen 325 MG tablet  Commonly known as:  TYLENOL  Take 325-650 mg by mouth every 6 (six) hours as needed. For pain.     chlorhexidine 0.12 % solution  Commonly known as:  PERIDEX  Use as directed 15 mLs in the mouth or throat See admin instructions. He is to use 5-6 times daily for exposure to bone  in his mouth.  cholecalciferol 1000 UNITS tablet  Commonly known as:  VITAMIN D  Take 1,000 Units by mouth daily.     dexamethasone 0.75 MG tablet  Commonly known as:  DECADRON  Take 0.75-1.5 mg by mouth daily. He will be taking two tablets on Monday and Thursday and one tablet the rest of the week.     finasteride 5 MG tablet  Commonly known as:  PROSCAR  Take 5 mg by mouth daily.     hydrocortisone 10 MG tablet  Commonly known as:  CORTEF  Take 3 tab qdx 2d, then 2 tab qd x2d, then 1 tab qd x 2d and stop     latanoprost 0.005 % ophthalmic solution  Commonly known as:  XALATAN  Place 1 drop into both eyes at bedtime.     metFORMIN 500 MG tablet  Commonly known as:  GLUCOPHAGE  Take 500 mg by mouth 2 (two) times daily with a meal.     methocarbamol 500 MG tablet  Commonly known as:  ROBAXIN  Take 500 mg by mouth every 6 (six) hours as needed. For muscle spasms.          Disposition:  Discharge Home with Home Health Services.   Discharged Condition: [name] has met maximum benefit of inpatient care and is medically stable and cleared for discharge.  Patient is pending follow up as above.      Time spent on disposition:  Greater than 35 minutes.    Sandrea Hughs, MD Pulmonary and Critical Care Medicine [facility] Cell [phone] After 5:30 PM or weekends, call [phone]

## 2013-02-15 LAB — GLUCOSE, CAPILLARY
Glucose-Capillary: 75 mg/dL (ref 70–99)
Glucose-Capillary: 62 mg/dL — ABNORMAL LOW (ref 70–99)
Glucose-Capillary: 63 mg/dL — ABNORMAL LOW (ref 70–99)
Glucose-Capillary: 67 mg/dL — ABNORMAL LOW (ref 70–99)
Glucose-Capillary: 78 mg/dL (ref 70–99)
Glucose-Capillary: 90 mg/dL (ref 70–99)
Glucose-Capillary: 108 mg/dL — ABNORMAL HIGH (ref 70–99)

## 2013-02-15 LAB — BASIC METABOLIC PANEL
BUN: 11 mg/dL (ref 6–23)
CO2: 24 mEq/L (ref 19–32)
Calcium: 8.3 mg/dL — ABNORMAL LOW (ref 8.4–10.5)
Chloride: 111 mEq/L (ref 96–112)
Creatinine, Ser: 0.62 mg/dL (ref 0.50–1.35)
GFR calc Af Amer: >90 mL/min (ref >90)
GFR calc non Af Amer: >90 mL/min (ref >90)
Glucose, Bld: 104 mg/dL — ABNORMAL HIGH (ref 70–99)
Potassium: 3.2 mEq/L — ABNORMAL LOW (ref 3.5–5.1)
Sodium: 143 mEq/L (ref 135–145)

## 2013-02-15 LAB — CBC
HCT: 21.3 % — ABNORMAL LOW (ref 39.0–52.0)
Hemoglobin: 7.3 g/dL — ABNORMAL LOW (ref 13.0–17.0)
MCH: 26 pg (ref 26.0–34.0)
MCHC: 34.3 g/dL (ref 30.0–36.0)
MCV: 75.8 fL — ABNORMAL LOW (ref 78.0–100.0)
Platelets: 314 10*3/uL (ref 150–400)
RBC: 2.81 MIL/uL — ABNORMAL LOW (ref 4.22–5.81)
RDW: 21.4 % — ABNORMAL HIGH (ref 11.5–15.5)
WBC: 16.8 10*3/uL — ABNORMAL HIGH (ref 4.0–10.5)

## 2013-02-15 MED ORDER — POTASSIUM CHLORIDE CRYS ER 20 MEQ PO TBCR
40.0000 meq | EXTENDED_RELEASE_TABLET | Freq: Once | ORAL | Status: AC
  Administered 2013-02-15: 40 meq via ORAL
  Filled 2013-02-15: qty 2

## 2013-02-15 NOTE — Evaluation (Signed)
Occupational Therapy Evaluation Patient Details Name: [name] MRN: [id number] DOB: 02-Jun-1956 Today's Date: 02/15/2013 Time: 4098-1191 OT Time Calculation (min): 61 min  OT Assessment / Plan / Recommendation History of present illness 57 y.o. malew hx MM c/b T8 disease and subsequent cord compression w paraplegia. In remission with no MM treatment for a year. On xarelto since 05/2012 for B LE DVT (also w IVF filter). Has chronic indwelling foley and colostomy.  Underwent foley change 7/23 found to have gross hematuria with clots. In [facility] ED Hgb 8.3, shock.     Clinical Impression   Pt was admitted for the above.  Will see pt one more time for UE HEP.  Pt is near baseline from OT perspective.      OT Assessment  Patient needs continued OT Services    Follow Up Recommendations  No OT follow up    Barriers to Discharge      Equipment Recommendations       Recommendations for Other Services    Frequency   (one more visit)    Precautions / Restrictions Precautions Precautions: Other (comment) Precaution Comments: pressure relieft- small pressure spots on lateral ankles bil- RN informed   Pertinent Vitals/Pain No pain reported.  Pt had 2/4 dyspnea when propelling w/c.  sats 91-97% on RA and HR 87-119    ADL  Transfers/Ambulation Related to ADLs: Used maximove to get OOB--has hoyer at home.  Pt propelled self in w/c with assist only for lines. ADL Comments: Pt is at baseline for adls (set up for UB).  He uses weights at [employer].  Has 5/5 strength.  Would like to continue strengthening while in the hospital, so will bring blue theraband for him with HEP.      OT Diagnosis: Generalized weakness  OT Problem List: Other (comment) (pt wants UE strengthening program while in hospital) OT Treatment Interventions: Therapeutic exercise   OT Goals(Current goals can be found in the care plan section) Acute Rehab OT Goals Patient Stated Goal: to get back home and get back to [facility] so he  can work out again.   OT Goal Formulation: With patient Time For Goal Achievement: 02/16/13 Potential to Achieve Goals: Good  Visit Information  Last OT Received On: 02/15/13 Assistance Needed: +1 History of Present Illness: 57 y.o. malew hx MM c/b T8 disease and subsequent cord compression w paraplegia. In remission with no MM treatment for a year. On xarelto since 05/2012 for B LE DVT (also w IVF filter). Has chronic indwelling foley and colostomy.  Underwent foley change 7/23 found to have gross hematuria with clots. In [facility] ED Hgb 8.3, shock.         Prior Functioning     Home Living Family/patient expects to be discharged to:: Private residence Living Arrangements: Spouse/significant other;Children Available Help at Discharge: Family;Other (Comment);Personal care attendant ([facility] program during the day, aid friday and saturday) Type of Home: House Home Access: Ramped entrance Home Equipment: Wheelchair - manual;Hospital bed;Other (comment) (hoyer) Additional Comments: pt performs adls at bed level; has shower 2x week at [employer] Prior Function Level of Independence: Needs assistance Gait / Transfers Assistance Needed: hoyer for transfers, mod assist from wife to roll in bed for hoyer placement ADL's / Homemaking Assistance Needed: max-total assist Communication / Swallowing Assistance Needed: none Comments: Does shower two times a week at [facility], then has aid for friday and saturday sponge bath at home.  Pt is at baseline of being able to perform UB adls Communication Communication:  No difficulties Dominant Hand: Right         Vision/Perception     Cognition  Cognition Arousal/Alertness: Awake/alert Behavior During Therapy: WFL for tasks assessed/performed Overall Cognitive Status: Within Functional Limits for tasks assessed    Extremity/Trunk Assessment Upper Extremity Assessment Upper Extremity Assessment: Overall WFL for tasks assessed Lower Extremity Assessment Lower  Extremity Assessment: LLE deficits/detail;RLE deficits/detail RLE Deficits / Details: parapelegia, non painful extensor muscle spasms with mobility LLE Deficits / Details: parapelegia, non painful extensor muscle spasms with mobility Cervical / Trunk Assessment Cervical / Trunk Assessment: Normal     Mobility Bed Mobility Bed Mobility: Rolling Right;Rolling Left Rolling Right: 3: Mod assist Rolling Left: 3: Mod assist Details for Bed Mobility Assistance: mod assist to get LEs and hips all the way over to sidelying.  Pt is able to manage his upper extremity and upper trunk with momentum and bed rails.   Transfers Transfer via Lift Equipment: Maximove Details for Transfer Assistance: pt is hoyer lift at home, so used our total lift to get him into the WC.       Exercise     Balance     End of Session OT - End of Session Activity Tolerance: Patient tolerated treatment well Patient left: in bed;with call bell/phone within reach  GO     [facility] 02/15/2013, 4:24 PM Marica Otter, OTR/L [phone] 02/15/2013

## 2013-02-15 NOTE — Progress Notes (Signed)
PULMONARY  / CRITICAL CARE MEDICINE  Name: [name] MRN: [id number] DOB: 1955/10/30    ADMISSION DATE:  02/12/2013 CONSULTATION DATE:  02/12/13  REFERRING MD :  Dr Effie Shy PRIMARY SERVICE: PCCM  CHIEF COMPLAINT:  Hemorrhagic shock  BRIEF PATIENT DESCRIPTION: 57 y/o man w hx MM c/b T8 disease and subsequent cord compression w paraplegia. In remission with no MM treatment for a year. On xarelto since 05/2012 for B LE DVT (also w IVF filter). Has chronic indwelling foley and colostomy.  Underwent foley change 7/23 found to have gross hematuria with clots. In [facility] ED Hgb 8.3, shock. Received Kcentra, 2u PRBC, 3L IVF. Started on dopamine. PCCM asked to evaluate and admit.   SIGNIFICANT EVENTS:  7/23 - Admit with gross hematuria on xarelto, hypotension 7/24 - Off dopamine  STUDIES:  LINES / TUBES: Hematuria foley (Dr Annabell Howells) 7/23 >>   CULTURES: Blood 7/23 >>  Urine 7/23 >> negative  ANTIBIOTICS:  SUBJECTIVE:    Denies chest pain, dyspnea. Chronic leg spasms. Asks why hands are swollen.  VITAL SIGNS: Temp:  [98 F (36.7 C)-98.4 F (36.9 C)] 98.4 F (36.9 C) (07/26 0545) Pulse Rate:  [58-75] 64 (07/26 0545) Resp:  [18-19] 18 (07/26 0545) BP: (119-132)/(59-70) 132/70 mmHg (07/26 0545) SpO2:  [96 %-100 %] 99 % (07/26 0545) Weight:  [128.005 kg (282 lb 3.2 oz)-128.096 kg (282 lb 6.4 oz)] 128.096 kg (282 lb 6.4 oz) (07/26 0545) Room Air  INTAKE / OUTPUT: Intake/Output     07/25 0701 - 07/26 0700 07/26 0701 - 07/27 0700   P.O. 600 360   I.V. (mL/kg) 675 (5.3)    Other     Total Intake(mL/kg) 1275 (10) 360 (2.8)   Urine (mL/kg/hr) 1825 (0.6)    Stool 200 (0.1)    Total Output 2025     Net -750 +360        Stool Occurrence 2 x      PHYSICAL EXAMINATION: General:  debilitated man, lying comfortably in bed, alert and pleasant Neuro:  No LE movement or sensation, able to move B UE, follows commands and interacts HEENT:  OP clear,  Cardiovascular:  s1s2 rrr, no MRG Lungs:   Clear B, unlabored speech Abdomen:  Soft, NT, colostomy OK, + BS Musculoskeletal: some LE contracture, few spasms Skin:  No breakdown or ulcers GU- foley- dark urine with blood tinge  Labs: CBC Recent Labs     02/13/13  0525  02/14/13  0352  02/14/13  1714  02/15/13  0343  WBC  19.3*  15.1*   --   16.8*  HGB  9.0*  7.8*  7.6*  7.3*  HCT  25.5*  22.1*  21.8*  21.3*  PLT  330  299   --   314    Coag's No results found for this basename: APTT, INR,  in the last 72 hours  BMET Recent Labs     02/13/13  0525  02/14/13  0352  02/15/13  0343  NA  139  142  143  K  3.9  3.7  3.2*  CL  109  111  111  CO2  22  23  24   BUN  6  8  11   CREATININE  0.58  0.64  0.62  GLUCOSE  205*  127*  104*    Electrolytes Recent Labs     02/13/13  0525  02/14/13  0352  02/15/13  0343  CALCIUM  8.7  8.4  8.3*  MG  1.3*   --    --   PHOS  2.1*   --    --     Sepsis Markers Recent Labs     02/12/13  1755  02/13/13  0525  PROCALCITON  <0.10  0.11    Cardiac Enzymes Recent Labs     02/12/13  1754  02/12/13  2325  02/13/13  0525  TROPONINI  <0.30  <0.30  <0.30    Glucose Recent Labs     02/14/13  0735  02/14/13  1149  02/14/13  1557  02/14/13  1705  02/14/13  2125  02/15/13  0721  GLUCAP  104*  117*  140*  137*  169*  75    Imaging Dg Chest Port 1 View  02/14/2013   *RADIOLOGY REPORT*  Clinical Data: Shortness of breath  PORTABLE CHEST - 1 VIEW  Comparison: Prior radiograph from 02/12/2013  Findings: Cardiac silhouette and mediastinal contours are stable, and remain within normal limits.  The lungs are clear.  Previously seen patchy opacity overlying the left heart border and lingula has resolved.  There is no pleural effusion.  Sequelae of prior spinal fusion is again noted.  No acute osseous abnormality.  IMPRESSION: No acute cardiopulmonary process identified.  Interval improvement in aeration of the left lung base.   Original Report Authenticated By: Rise Mu, M.D.    ASSESSMENT / PLAN:  CARDIOVASCULAR A:  Shock - suspect hemorrhagic. Also note hx adrenal insuff and dexamethasone use.  Off pressors 7/24. Hands swelling on IVF Hypokalemia 3.2 P:  -even fluid balance >>  -Slow IV to 10-20 KVO -K replace 7/26 x 1  HEMATOLOGIC A:   Acute blood loss - hematuria post foley replacement.  S/p 2 units PRBC's 7/23. Hgb now down to 7.3 w/ conmtinued slow blood loss. Multiple myeloma - in remission. Known bony disease Coagulopathy on xarelto - for LE DVT's, noted while on coumadin. IVC filter in place P:  - follow serial CBC and degree hematuria - hold anticoagulation for now >> Hgb not stable. Continued slow loss. Xarelto not restarted  RENAL A:  Hematuria - on xarelto; likely related to foley manipulation.  Neurogenic bladder - chronic foley w hypospadias  P: - continue foley (placed by urology 7/23)   INFECTIOUS A:   No evidence for infection. 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 Occasional spasms P:   - PT/OT    CD Young, MD PCCM [phone] After 3pm call: [phone]

## 2013-02-15 NOTE — Progress Notes (Signed)
Hypoglycemic Event  CBG: 62  Treatment: 15 GM carbohydrate snack  Symptoms: None  Follow-up CBG: Time:1225 CBG Result:63  Possible Reasons for Event: Unknown  Comments/MD notified:    Armanda Heritage  Remember to initiate Hypoglycemia Order Set & complete

## 2013-02-15 NOTE — Evaluation (Signed)
Physical Therapy Evaluation Patient Details Name: [name] MRN: [id number] DOB: Dec 02, 1955 Today's Date: 02/15/2013 Time: 7829-5621 PT Time Calculation (min): 61 min  PT Assessment / Plan / Recommendation History of Present Illness  57 y.o. malew hx MM c/b T8 disease and subsequent cord compression w paraplegia. In remission with no MM treatment for a year. On xarelto since 05/2012 for B LE DVT (also w IVF filter). Has chronic indwelling foley and colostomy.  Underwent foley change 7/23 found to have gross hematuria with clots. In [facility] ED Hgb 8.3, shock.    Clinical Impression  Pt does present with decreased activity tolerance compared to his normal very active self, but physically he is at baseline using a hoyer to get OOB into WC.  He has no acute PT needs at this time.  I did encourage him if he is here a few more days to ask the RN staff to lift him into a WC so he can get some upper extremity exercise and wheel about the unit with supervision.      PT Assessment  Patent does not need any further PT services    Follow Up Recommendations  Other (comment) (continue [facility]ACE program at discharge)    Does the patient have the potential to tolerate intense rehabilitation     yes  Barriers to Discharge   none      Equipment Recommendations  None recommended by PT          Precautions / Restrictions Precautions Precautions: Other (comment) (foley, colostomy bag on left ) Precaution Comments: pressure relieft- small pressure spots on lateral ankles bil- RN informed   Pertinent Vitals/Pain HR 112, O2 sats 90% on RA, DOE 3/4 with fast paced WC mobility, once slowed pace O2 sats 98%, HR 96 bpm, DOE 2/4.        Mobility  Bed Mobility Bed Mobility: Rolling Right;Rolling Left Rolling Right: 3: Mod assist Rolling Left: 3: Mod assist Details for Bed Mobility Assistance: mod assist to get LEs and hips all the way over to sidelying.  Pt is able to manage his upper extremity and upper  trunk with momentum and bed rails.   Transfers Transfer via Lift Equipment: Maximove Details for Transfer Assistance: pt is hoyer lift at home, so used our total lift to get him into the WC.   Corporate treasurer: Yes Wheelchair Assistance: 5: Financial planner Details (indicate cue type and reason): supervision for safety due to medical issues, fatigue, safety.   Pt pushing at a quick speed at first, but became dyspnic 3/4 and had to take a rest break. O2 sats 90% on RA and HR 112 bpm.  Once speed slowed pt's HR 96 bpm and O2 sats 98% with DOE 2/4.   Wheelchair Propulsion: Both upper extremities Wheelchair Parts Management: Needs assistance Distance: 500       PT Goals(Current goals can be found in the care plan section) Acute Rehab PT Goals Patient Stated Goal: to get back home and get back to [facility] so he can work out again.    Visit Information  Last PT Received On: 02/15/13 Assistance Needed: +1 PT/OT Co-Evaluation/Treatment: Yes History of Present Illness: 57 y.o. malew hx MM c/b T8 disease and subsequent cord compression w paraplegia. In remission with no MM treatment for a year. On xarelto since 05/2012 for B LE DVT (also w IVF filter). Has chronic indwelling foley and colostomy.  Underwent foley change 7/23 found to have gross hematuria with clots. In  [facility] ED Hgb 8.3, shock.         Prior Functioning  Home Living Family/patient expects to be discharged to:: Private residence Living Arrangements: Spouse/significant other;Children Available Help at Discharge: Family;Other (Comment);Personal care attendant ([facility] program during the day, aid friday and saturday) Type of Home: House Home Access: Ramped entrance Home Equipment: Wheelchair - manual;Hospital bed;Other (comment) (hoyer) Prior Function Level of Independence: Needs assistance Gait / Transfers Assistance Needed: hoyer for transfers, mod assist from wife to roll in bed for hoyer  placement ADL's / Homemaking Assistance Needed: max-total assist Communication / Swallowing Assistance Needed: none Comments: Does shower two times a week at [facility], then has aid for friday and saturday sponge bath at home.   Communication Communication: No difficulties Dominant Hand: Right    Cognition  Cognition Arousal/Alertness: Awake/alert Behavior During Therapy: WFL for tasks assessed/performed Overall Cognitive Status: Within Functional Limits for tasks assessed    Extremity/Trunk Assessment Upper Extremity Assessment Upper Extremity Assessment: Overall WFL for tasks assessed Lower Extremity Assessment Lower Extremity Assessment: LLE deficits/detail;RLE deficits/detail RLE Deficits / Details: parapelegia, non painful extensor muscle spasms with mobility LLE Deficits / Details: parapelegia, non painful extensor muscle spasms with mobility Cervical / Trunk Assessment Cervical / Trunk Assessment: Normal      End of Session PT - End of Session Equipment Utilized During Treatment: Gait belt Activity Tolerance: Patient limited by fatigue Patient left: in bed;with call bell/phone within reach (on left side for pressure relief with bil heels/ankles float) Nurse Communication: Mobility status;Other (comment) (lateral ankle pressure areas)    Renesha Lizama B. Ruger Saxer, PT, DPT [phone]   02/15/2013, 4:00 PM

## 2013-02-15 NOTE — Progress Notes (Signed)
Hypoglycemic Event  CBG: 63   Treatment: 15 GM carbohydrate snack  Symptoms: None  Follow-up CBG: Time:1240 CBG Result:67  Possible Reasons for Event: Unknown  Comments/MD notified:    Armanda Heritage  Remember to initiate Hypoglycemia Order Set & complete

## 2013-02-15 NOTE — Progress Notes (Signed)
Hypoglycemic Event  CBG: 67  Treatment: 15 GM carbohydrate snack  Symptoms: None  Follow-up CBG: Time:1255 CBG Result:78  Possible Reasons for Event: Unknown  Comments/MD notified:    Armanda Heritage  Remember to initiate Hypoglycemia Order Set & complete

## 2013-02-15 NOTE — Progress Notes (Signed)
Occupational Therapy Treatment Patient Details Name: [name] MRN: [id number] DOB: 01-01-1956 Today's Date: 02/15/2013 Time: 4098-1191 OT Time Calculation (min): 6 min  OT Assessment / Plan / Recommendation  History of present illness 57 y.o. malew hx MM c/b T8 disease and subsequent cord compression w paraplegia. In remission with no MM treatment for a year. On xarelto since 05/2012 for B LE DVT (also w IVF filter). Has chronic indwelling foley and colostomy.  Underwent foley change 7/23 found to have gross hematuria with clots. In [place] ED Hgb 8.3, shock.     Clinical Impression    OT comments  Brief tx:  No charge.    Follow Up Recommendations  No OT follow up    Barriers to Discharge       Equipment Recommendations       Recommendations for Other Services    Frequency    Progress towards OT Goals Progress towards OT goals: Goals met/education completed, patient discharged from OT (verbalizes understanding of HEP)  Plan      Precautions / Restrictions Precautions Precautions: Other (comment) Precaution Comments: pressure relieft- small pressure spots on lateral ankles bil- RN informed   Pertinent Vitals/Pain No pain    ADL   ADL Comments: Did not work through exercises as pt had bleeding IV site.  RN came and swtiched IV to other existing site.  Pt verbalizes understanding of all.  Did not feel he needed to review exercises.      OT Diagnosis: Generalized weakness  OT Problem List: Other (comment) (pt wants UE strengthening program while in hospital) OT Treatment Interventions: Therapeutic exercise   OT Goals(current goals can now be found in the care plan section) Acute Rehab OT Goals Patient Stated Goal: to get back home and get back to [facility] so he can work out again.   OT Goal Formulation: With patient Time For Goal Achievement: 02/16/13 Potential to Achieve Goals: Good  Visit Information  Last OT Received On: 02/15/13 Assistance Needed: +1 History of  Present Illness: 57 y.o. malew hx MM c/b T8 disease and subsequent cord compression w paraplegia. In remission with no MM treatment for a year. On xarelto since 05/2012 for B LE DVT (also w IVF filter). Has chronic indwelling foley and colostomy.  Underwent foley change 7/23 found to have gross hematuria with clots. In [name] ED Hgb 8.3, shock.      Subjective Data      Prior Functioning      Cognition  Cognition Arousal/Alertness: Awake/alert Behavior During Therapy: WFL for tasks assessed/performed Overall Cognitive Status: Within Functional Limits for tasks assessed    Mobility      Exercises  Other Exercises Other Exercises: issued theraband (blue) with shoulder flex/ext/and horizontal add.  Pt verbalizes understanding of all   Balance     End of Session OT - End of Session Activity Tolerance: Patient tolerated treatment well Patient left: in bed;with call bell/phone within reach  GO     [facility] 02/15/2013, 4:49 PM Marica Otter, OTR/L [phone] 02/15/2013

## 2013-02-16 LAB — CBC WITH DIFFERENTIAL/PLATELET
Basophils Absolute: 0.1 10*3/uL (ref 0.0–0.1)
Basophils Relative: 0 % (ref 0–1)
Eosinophils Absolute: 0.3 10*3/uL (ref 0.0–0.7)
Eosinophils Relative: 2 % (ref 0–5)
HCT: 23.7 % — ABNORMAL LOW (ref 39.0–52.0)
Hemoglobin: 8.1 g/dL — ABNORMAL LOW (ref 13.0–17.0)
Lymphocytes Relative: 10 % — ABNORMAL LOW (ref 12–46)
Lymphs Abs: 1.6 10*3/uL (ref 0.7–4.0)
MCH: 26.3 pg (ref 26.0–34.0)
MCHC: 34.2 g/dL (ref 30.0–36.0)
MCV: 76.9 fL — ABNORMAL LOW (ref 78.0–100.0)
Monocytes Absolute: 0.6 10*3/uL (ref 0.1–1.0)
Monocytes Relative: 3 % (ref 3–12)
Neutro Abs: 13.7 10*3/uL — ABNORMAL HIGH (ref 1.7–7.7)
Neutrophils Relative %: 84 % — ABNORMAL HIGH (ref 43–77)
Platelets: 359 10*3/uL (ref 150–400)
RBC: 3.08 MIL/uL — ABNORMAL LOW (ref 4.22–5.81)
RDW: 22.2 % — ABNORMAL HIGH (ref 11.5–15.5)
WBC: 16.2 10*3/uL — ABNORMAL HIGH (ref 4.0–10.5)

## 2013-02-16 LAB — GLUCOSE, CAPILLARY
Glucose-Capillary: 83 mg/dL (ref 70–99)
Glucose-Capillary: 85 mg/dL (ref 70–99)
Glucose-Capillary: 104 mg/dL — ABNORMAL HIGH (ref 70–99)

## 2013-02-16 LAB — BASIC METABOLIC PANEL
BUN: 9 mg/dL (ref 6–23)
CO2: 22 mEq/L (ref 19–32)
Calcium: 8.4 mg/dL (ref 8.4–10.5)
Chloride: 112 mEq/L (ref 96–112)
Creatinine, Ser: 0.63 mg/dL (ref 0.50–1.35)
GFR calc Af Amer: >90 mL/min (ref >90)
GFR calc non Af Amer: >90 mL/min (ref >90)
Glucose, Bld: 112 mg/dL — ABNORMAL HIGH (ref 70–99)
Potassium: 3.5 mEq/L (ref 3.5–5.1)
Sodium: 143 mEq/L (ref 135–145)

## 2013-02-16 MED ORDER — VITAMINS A & D EX OINT
TOPICAL_OINTMENT | CUTANEOUS | Status: AC
  Administered 2013-02-16: 12:00:00
  Filled 2013-02-16: qty 5

## 2013-02-16 NOTE — Progress Notes (Signed)
PULMONARY  / CRITICAL CARE MEDICINE  Name: [name] MRN: [id number] DOB: [birth year]    ADMISSION DATE:  02/12/2013 CONSULTATION DATE:  02/12/13  REFERRING MD :  Dr Effie Shy PRIMARY SERVICE: PCCM  CHIEF COMPLAINT:  Hemorrhagic shock  BRIEF PATIENT DESCRIPTION: 57 y/o man w hx MM c/b T8 disease and subsequent cord compression w paraplegia. In remission with no MM treatment for a year. On xarelto since 05/2012 for B LE DVT (also w IVF filter). Has chronic indwelling foley and colostomy.  Underwent foley change 7/23 found to have gross hematuria with clots. In [facility] ED Hgb 8.3, shock. Received Kcentra, 2u PRBC, 3L IVF. Started on dopamine. PCCM asked to evaluate and admit.   SIGNIFICANT EVENTS:  7/23 - Admit with gross hematuria on xarelto, hypotension 7/24 - Off dopamine  STUDIES:  LINES / TUBES: Hematuria foley (Dr Annabell Howells) 7/23 >>   CULTURES: Blood 7/23 >>  Urine 7/23 >> negative  ANTIBIOTICS:  SUBJECTIVE:  Hand swelling improved with reduced IVF  VITAL SIGNS: Temp:  [98.1 F (36.7 C)-99.4 F (37.4 C)] 98.6 F (37 C) (07/27 0644) Pulse Rate:  [69-112] 69 (07/27 0644) Resp:  [18-20] 20 (07/27 0644) BP: (101-128)/(64-70) 128/70 mmHg (07/27 0644) SpO2:  [90 %-100 %] 100 % (07/27 0644) Weight:  [130.182 kg (287 lb)] 130.182 kg (287 lb) (07/27 0644) Room Air  INTAKE / OUTPUT: Intake/Output     07/26 0701 - 07/27 0700 07/27 0701 - 07/28 0700   P.O. 1680 480   I.V. (mL/kg) 516 (4)    Total Intake(mL/kg) 2196 (16.9) 480 (3.7)   Urine (mL/kg/hr) 1325 (0.4)    Stool 850 (0.3)    Total Output 2175     Net +21 +480        Stool Occurrence       PHYSICAL EXAMINATION: General:  debilitated man, lying comfortably in bed, alert and pleasant Neuro:  No LE movement or sensation, able to move B UE, follows commands and interacts HEENT:  OP clear,  Cardiovascular:  s1s2 rrr, no MRG Lungs:  Clear B, unlabored speech Abdomen:  Soft, NT, colostomy OK, + BS Musculoskeletal: some  LE contracture, few spasms Skin:  No breakdown or ulcers GU- foley- dark urine with blood tinge  Labs: CBC Recent Labs     02/14/13  0352  02/14/13  1714  02/15/13  0343  02/16/13  0410  WBC  15.1*   --   16.8*  16.2*  HGB  7.8*  7.6*  7.3*  8.1*  HCT  22.1*  21.8*  21.3*  23.7*  PLT  299   --   314  359    Coag's No results found for this basename: APTT, INR,  in the last 72 hours  BMET Recent Labs     02/14/13  0352  02/15/13  0343  02/16/13  0410  NA  142  143  143  K  3.7  3.2*  3.5  CL  111  111  112  CO2  23  24  22   BUN  8  11  9   CREATININE  0.64  0.62  0.63  GLUCOSE  127*  104*  112*    Electrolytes Recent Labs     02/14/13  0352  02/15/13  0343  02/16/13  0410  CALCIUM  8.4  8.3*  8.4    Sepsis Markers No results found for this basename: LACTICACIDVEN, PROCALCITON, O2SATVEN,  in the last 72 hours  Cardiac Enzymes No results  found for this basename: TROPONINI, PROBNP,  in the last 72 hours  Glucose Recent Labs     02/15/13  1223  02/15/13  1240  02/15/13  1255  02/15/13  1719  02/15/13  2225  02/16/13  0735  GLUCAP  63*  67*  78  90  108*  83    Imaging No results found.  ASSESSMENT / PLAN:  CARDIOVASCULAR A:  Shock - suspect hemorrhagic. Also note hx adrenal insuff and dexamethasone use.  Off pressors 7/24. Hands swelling on IVF Hypokalemia 3.2 P:  -even fluid balance >>  -Slowed IV to 10-20 KVO -K replace 7/26 x 1  HEMATOLOGIC A:   Acute blood loss - hematuria post foley replacement.  S/p 2 units PRBC's 7/23. Hgb now down to 7.3 w/ conmtinued slow blood loss. 7/27-Hgb up and peripheral edema down with reduced IVF Multiple myeloma - in remission. Known bony disease Coagulopathy on xarelto - for LE DVT's, noted while on coumadin. IVC filter in place P:  - follow serial CBC and degree hematuria - hold anticoagulation for now >>. Xarelto not restarted.   RENAL A:  Hematuria - ; likely related to foley manipulation.   Neurogenic bladder - chronic foley w hypospadias  GU f/u 7/27- offers suprapubic tube, less likely to bleed. No intervention pending P: - continues foley (placed by urology 7/23). Pt will consider suprapubic by GU for long term   INFECTIOUS A:   No evidence for infection. 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 Occasional spasms P:   - PT/OT    CD Bruchy Mikel, MD PCCM [phone] After 3pm call: [phone]

## 2013-02-16 NOTE — Progress Notes (Signed)
  Subjective:  1 - Hematuria / Catheter Trauma - pt with presentation 7/23 to [facility] ER with gross hematuria with clots via chronic foley after minor trauma earlier in day, attempted replacement resulted in further trauma and then replaced into adequate position and bladder irrigated to clear. On Xarelto at baseline, now being held. Also known h/o prostatic bleeding improved on finasteride. UCX negative.  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  Today Rigley is seen in f/u above. No interval catheter problems.   Objective: Vital signs in last 24 hours: Temp:  [98.1 F (36.7 C)-99.4 F (37.4 C)] 98.6 F (37 C) (07/27 0644) Pulse Rate:  [69-112] 69 (07/27 0644) Resp:  [18-20] 20 (07/27 0644) BP: (101-128)/(64-70) 128/70 mmHg (07/27 0644) SpO2:  [90 %-100 %] 100 % (07/27 0644) Weight:  [130.182 kg (287 lb)] 130.182 kg (287 lb) (07/27 0644) Last BM Date: 02/15/13  Intake/Output from previous day: 07/26 0701 - 07/27 0700 In: 2196 [P.O.:1680; I.V.:516] Out: 2175 [Urine:1325; Stool:850] Intake/Output this shift:    General appearance: alert, cooperative, appears stated age and appears more well today Head: Normocephalic, without obvious abnormality, atraumatic Eyes: conjunctivae/corneas clear. PERRL, EOM's intact. Fundi benign. Ears: normal TM's and external ear canals both ears Nose: Nares normal. Septum midline. Mucosa normal. No drainage or sinus tenderness. Throat: lips, mucosa, and tongue normal; teeth and gums normal Neck: no adenopathy, no carotid bruit, no JVD, supple, symmetrical, trachea midline and thyroid not enlarged, symmetric, no tenderness/mass/nodules Back: symmetric, no curvature. ROM normal. No CVA tenderness. Resp: clear to auscultation bilaterally Chest wall: no tenderness Cardio: regular rate and rhythm, S1, S2 normal, no murmur, click, rub or gallop GI: soft, non-tender; bowel sounds normal; no masses,   no organomegaly Male genitalia: normal, traumatic hypospadias with foley c/d/i. Small amt old blood mixed with urine. No clots. Extremities: LE contractures as per baseline Pulses: 2+ and symmetric Neurologic: Mental status: Alert, oriented, thought content appropriate Incision/Wound:  Lab Results:   Recent Labs  02/15/13 0343 02/16/13 0410  WBC 16.8* 16.2*  HGB 7.3* 8.1*  HCT 21.3* 23.7*  PLT 314 359   BMET  Recent Labs  02/15/13 0343 02/16/13 0410  NA 143 143  K 3.2* 3.5  CL 111 112  CO2 24 22  GLUCOSE 104* 112*  BUN 11 9  CREATININE 0.62 0.63  CALCIUM 8.3* 8.4   PT/INR No results found for this basename: LABPROT, INR,  in the last 72 hours ABG No results found for this basename: PHART, PCO2, PO2, HCO3,  in the last 72 hours  Studies/Results: No results found.  Anti-infectives: Anti-infectives   None      Assessment/Plan:  1 - Hematuria / Catheter Trauma - Continues to improve clinically. No furhter GU intervention warranted at this time.  2 - Neurogenic Bladder - I again discussed with Nayef other means of managmeent including SPT. He will think about it. I told him again that SPT would likely have lower overall future bleeding risk as his bleeding has been mostly from prostatic fossa.    3 - F/u as previously scheduled as outpatient. He will call us for sooner appt if he wants to proceed with SP Tube.   4 - Call with questions. [facility], Jayley Hustead 02/16/2013

## 2013-02-17 DIAGNOSIS — R339 Retention of urine, unspecified: Secondary | ICD-10-CM

## 2013-02-17 LAB — CBC
HCT: 25 % — ABNORMAL LOW (ref 39.0–52.0)
Hemoglobin: 8.4 g/dL — ABNORMAL LOW (ref 13.0–17.0)
MCH: 26.1 pg (ref 26.0–34.0)
MCHC: 33.6 g/dL (ref 30.0–36.0)
MCV: 77.6 fL — ABNORMAL LOW (ref 78.0–100.0)
Platelets: 388 10*3/uL (ref 150–400)
RBC: 3.22 MIL/uL — ABNORMAL LOW (ref 4.22–5.81)
RDW: 23 % — ABNORMAL HIGH (ref 11.5–15.5)
WBC: 18.7 10*3/uL — ABNORMAL HIGH (ref 4.0–10.5)

## 2013-02-17 LAB — GLUCOSE, CAPILLARY
Glucose-Capillary: 79 mg/dL (ref 70–99)
Glucose-Capillary: 74 mg/dL (ref 70–99)
Glucose-Capillary: 89 mg/dL (ref 70–99)
Glucose-Capillary: 86 mg/dL (ref 70–99)

## 2013-02-17 MED ORDER — VITAMINS A & D EX OINT
TOPICAL_OINTMENT | CUTANEOUS | Status: AC
  Administered 2013-02-17: 11:00:00
  Filled 2013-02-17: qty 10

## 2013-02-17 MED ORDER — HYDROCORTISONE 10 MG PO TABS: ORAL_TABLET | ORAL | Status: DC

## 2013-02-17 NOTE — Progress Notes (Signed)
CSW received referral that pt needing ambulance transportation for return to home with [facility].  CSW met with pt at bedside to confirm address and discussed with pt wife via telephone.  Pt wife gets off of work at 5 pm and felt that scheduling transport at 5:30 pm would be best time in order for pt wife to have time to get home from work.  CSW notified pt and pt RN.  CSW arranged transport for 5:30pm via [facility].  No further social work needs identified at this time.  CSW signing off.   Jacklynn Lewis, MSW, LCSWA  Clinical Social Work [phone]

## 2013-02-17 NOTE — Progress Notes (Signed)
Pt in good spirits talking about the day facility he goes to Monday-Friday. Pt able to pull himself up in bed. Encouraged pt to reposition off his back, he complied, q 4hours.

## 2013-02-17 NOTE — Plan of Care (Signed)
Problem: Phase III Progression Outcomes Goal: Voiding independently Outcome: Not Applicable Date Met:  02/17/13 Pt paralyzed

## 2013-02-17 NOTE — Plan of Care (Signed)
Problem: Phase III Progression Outcomes Goal: Foley discontinued Outcome: Not Progressing Pt paralyzed

## 2013-02-17 NOTE — Progress Notes (Signed)
Pt discharged to home. Discharge instructions explained to pt. Ambulance called to give pt a ride home, will pick up pt at 1730.

## 2013-02-18 LAB — CULTURE, BLOOD (ROUTINE X 2)
Culture: NO GROWTH
Culture: NO GROWTH

## 2013-03-18 ENCOUNTER — Ambulatory Visit: Payer: Medicaid Other

## 2013-03-18 ENCOUNTER — Other Ambulatory Visit: Payer: Medicaid Other | Admitting: Lab

## 2013-03-18 ENCOUNTER — Other Ambulatory Visit (HOSPITAL_COMMUNITY): Payer: Medicaid Other

## 2013-03-25 ENCOUNTER — Other Ambulatory Visit: Payer: Self-pay | Admitting: Urology

## 2013-03-26 ENCOUNTER — Telehealth: Payer: Self-pay | Admitting: Oncology

## 2013-03-26 NOTE — Telephone Encounter (Signed)
Faxed pt medical records to [facility] and requested slides to be fedex.

## 2013-03-27 ENCOUNTER — Encounter (HOSPITAL_COMMUNITY): Payer: Self-pay | Admitting: Pharmacy Technician

## 2013-04-01 ENCOUNTER — Encounter (HOSPITAL_COMMUNITY): Payer: Self-pay | Admitting: *Deleted

## 2013-04-01 NOTE — Progress Notes (Signed)
Called wife as contact number to review medical history and give instructions- [phone]- Annice Pih-   Lost contact with her phone x 1- called back and was told was a work and how long would this take?  Offered to have her return my call this pm or tomorrow on her break. "No- just do it now"  Appeared to be in a hurry and answered questions very quickly. States was told to be here 0645 am Friday and transportation would be arriving at their house 0600 and is only 10 minutes away!  States husband can sign his own papers and is immobile and hoyer lift will be needed

## 2013-04-03 MED ORDER — GENTAMICIN SULFATE 40 MG/ML IJ SOLN
440.0000 mg | INTRAVENOUS | Status: AC
  Administered 2013-04-04: 440 mg via INTRAVENOUS
  Filled 2013-04-03 (×2): qty 11

## 2013-04-04 ENCOUNTER — Ambulatory Visit (HOSPITAL_COMMUNITY)
Admission: RE | Admit: 2013-04-04 | Discharge: 2013-04-04 | Disposition: A | Discharge status: Home / Self Care | Payer: Medicare (Managed Care) | Source: Ambulatory Visit | Attending: Urology | Admitting: Urology

## 2013-04-04 ENCOUNTER — Encounter (HOSPITAL_COMMUNITY): Payer: Self-pay | Admitting: Anesthesiology

## 2013-04-04 ENCOUNTER — Encounter (HOSPITAL_COMMUNITY)
Admission: RE | Disposition: A | Discharge status: Home / Self Care | Payer: Self-pay | Source: Ambulatory Visit | Attending: Urology

## 2013-04-04 ENCOUNTER — Encounter (HOSPITAL_COMMUNITY): Payer: Self-pay

## 2013-04-04 ENCOUNTER — Ambulatory Visit (HOSPITAL_COMMUNITY): Payer: Medicare (Managed Care) | Admitting: Anesthesiology

## 2013-04-04 DIAGNOSIS — R310 Gross hematuria: Secondary | ICD-10-CM | POA: Insufficient documentation

## 2013-04-04 DIAGNOSIS — N319 Neuromuscular dysfunction of bladder, unspecified: Secondary | ICD-10-CM | POA: Insufficient documentation

## 2013-04-04 DIAGNOSIS — E119 Type 2 diabetes mellitus without complications: Secondary | ICD-10-CM | POA: Insufficient documentation

## 2013-04-04 DIAGNOSIS — Z6836 Body mass index (BMI) 36.0-36.9, adult: Secondary | ICD-10-CM | POA: Insufficient documentation

## 2013-04-04 DIAGNOSIS — G8190 Hemiplegia, unspecified affecting unspecified side: Secondary | ICD-10-CM | POA: Insufficient documentation

## 2013-04-04 DIAGNOSIS — C9000 Multiple myeloma not having achieved remission: Secondary | ICD-10-CM | POA: Insufficient documentation

## 2013-04-04 DIAGNOSIS — N210 Calculus in bladder: Secondary | ICD-10-CM | POA: Insufficient documentation

## 2013-04-04 DIAGNOSIS — E2749 Other adrenocortical insufficiency: Secondary | ICD-10-CM | POA: Insufficient documentation

## 2013-04-04 DIAGNOSIS — Q549 Hypospadias, unspecified: Secondary | ICD-10-CM | POA: Insufficient documentation

## 2013-04-04 HISTORY — DX: Coagulation defect, unspecified: D68.9

## 2013-04-04 HISTORY — DX: Type 2 diabetes mellitus without complications: E11.9

## 2013-04-04 HISTORY — DX: Paraplegia, unspecified: G82.20

## 2013-04-04 HISTORY — PX: CYSTOSCOPY: SHX5120

## 2013-04-04 HISTORY — DX: Gross hematuria: R31.0

## 2013-04-04 HISTORY — PX: INSERTION OF SUPRAPUBIC CATHETER: SHX5870

## 2013-04-04 HISTORY — DX: Personal history of other medical treatment: Z92.89

## 2013-04-04 HISTORY — DX: Unspecified adrenocortical insufficiency: E27.40

## 2013-04-04 LAB — BASIC METABOLIC PANEL
BUN: 12 mg/dL (ref 6–23)
CO2: 27 mEq/L (ref 19–32)
Calcium: 10 mg/dL (ref 8.4–10.5)
Chloride: 104 mEq/L (ref 96–112)
Creatinine, Ser: 0.8 mg/dL (ref 0.50–1.35)
GFR calc Af Amer: >90 mL/min (ref >90)
GFR calc non Af Amer: >90 mL/min (ref >90)
Glucose, Bld: 94 mg/dL (ref 70–99)
Potassium: 4.3 mEq/L (ref 3.5–5.1)
Sodium: 140 mEq/L (ref 135–145)

## 2013-04-04 LAB — CBC
HCT: 32.8 % — ABNORMAL LOW (ref 39.0–52.0)
Hemoglobin: 10.8 g/dL — ABNORMAL LOW (ref 13.0–17.0)
MCH: 24.3 pg — ABNORMAL LOW (ref 26.0–34.0)
MCHC: 32.9 g/dL (ref 30.0–36.0)
MCV: 73.9 fL — ABNORMAL LOW (ref 78.0–100.0)
Platelets: 465 10*3/uL — ABNORMAL HIGH (ref 150–400)
RBC: 4.44 MIL/uL (ref 4.22–5.81)
RDW: 18.2 % — ABNORMAL HIGH (ref 11.5–15.5)
WBC: 15.3 10*3/uL — ABNORMAL HIGH (ref 4.0–10.5)

## 2013-04-04 LAB — GLUCOSE, CAPILLARY
Glucose-Capillary: 95 mg/dL (ref 70–99)
Glucose-Capillary: 86 mg/dL (ref 70–99)

## 2013-04-04 SURGERY — INSERTION, SUPRAPUBIC CATHETER
Anesthesia: General | Wound class: Clean Contaminated

## 2013-04-04 MED ORDER — MIDAZOLAM HCL 5 MG/5ML IJ SOLN
INTRAMUSCULAR | Status: DC | PRN
  Administered 2013-04-04: 0.5 mg via INTRAVENOUS

## 2013-04-04 MED ORDER — LACTATED RINGERS IV SOLN
INTRAVENOUS | Status: DC | PRN
  Administered 2013-04-04: 08:00:00 via INTRAVENOUS

## 2013-04-04 MED ORDER — KETAMINE HCL 10 MG/ML IJ SOLN
INTRAMUSCULAR | Status: DC | PRN
  Administered 2013-04-04: 20 mg via INTRAVENOUS

## 2013-04-04 MED ORDER — PROMETHAZINE HCL 25 MG/ML IJ SOLN: 6.2500 mg | INTRAMUSCULAR | Status: DC | PRN

## 2013-04-04 MED ORDER — ONDANSETRON HCL 4 MG/2ML IJ SOLN
INTRAMUSCULAR | Status: DC | PRN
  Administered 2013-04-04 (×2): 2 mg via INTRAVENOUS

## 2013-04-04 MED ORDER — LIDOCAINE HCL (CARDIAC) 20 MG/ML IV SOLN
INTRAVENOUS | Status: DC | PRN
  Administered 2013-04-04: 30 mg via INTRAVENOUS

## 2013-04-04 MED ORDER — FENTANYL CITRATE 0.05 MG/ML IJ SOLN: 25.0000 ug | INTRAMUSCULAR | Status: DC | PRN

## 2013-04-04 MED ORDER — SODIUM CHLORIDE 0.9 % IR SOLN
Status: DC | PRN
  Administered 2013-04-04: 3000 mL via INTRAVESICAL

## 2013-04-04 MED ORDER — FENTANYL CITRATE 0.05 MG/ML IJ SOLN
INTRAMUSCULAR | Status: DC | PRN
  Administered 2013-04-04 (×2): 25 ug via INTRAVENOUS
  Administered 2013-04-04: 50 ug via INTRAVENOUS

## 2013-04-04 MED ORDER — LACTATED RINGERS IV SOLN: INTRAVENOUS | Status: DC

## 2013-04-04 MED ORDER — PROPOFOL 10 MG/ML IV BOLUS
INTRAVENOUS | Status: DC | PRN
  Administered 2013-04-04: 175 mg via INTRAVENOUS

## 2013-04-04 MED ORDER — MEPERIDINE HCL 50 MG/ML IJ SOLN: 6.2500 mg | INTRAMUSCULAR | Status: DC | PRN

## 2013-04-04 MED ORDER — TRAMADOL HCL 50 MG PO TABS: 50.0000 mg | ORAL_TABLET | Freq: Four times a day (QID) | ORAL | Status: DC | PRN

## 2013-04-04 SURGICAL SUPPLY — 35 items
BAG URINE DRAINAGE (UROLOGICAL SUPPLIES) IMPLANT
BAG URO CATCHER STRL LF (DRAPE) ×2 IMPLANT
BLADE SURG ROTATE 9660 (MISCELLANEOUS) IMPLANT
CANISTER SUCT LVC 12 LTR MEDI- (MISCELLANEOUS) IMPLANT
CATH BONANNO SUPRAPUBIC 14G (CATHETERS) IMPLANT
CATH FOLEY 2WAY SLVR  5CC 16FR (CATHETERS)
CATH FOLEY 2WAY SLVR  5CC 18FR (CATHETERS)
CATH FOLEY 2WAY SLVR 5CC 16FR (CATHETERS) IMPLANT
CATH FOLEY 2WAY SLVR 5CC 18FR (CATHETERS) IMPLANT
CATH SILICONE 16FRX5CC (CATHETERS) ×1 IMPLANT
CLOTH BEACON ORANGE TIMEOUT ST (SAFETY) ×2 IMPLANT
DRAPE CAMERA CLOSED 9X96 (DRAPES) ×2 IMPLANT
ELECT REM PT RETURN 9FT ADLT (ELECTROSURGICAL)
ELECTRODE REM PT RTRN 9FT ADLT (ELECTROSURGICAL) IMPLANT
GLOVE BIO SURGEON STRL SZ8 (GLOVE) ×1 IMPLANT
GLOVE BIOGEL M STRL SZ7.5 (GLOVE) ×2 IMPLANT
GOWN PREVENTION PLUS LG XLONG (DISPOSABLE) ×2 IMPLANT
GOWN STRL REIN XL XLG (GOWN DISPOSABLE) ×2 IMPLANT
HOLDER FOLEY CATH W/STRAP (MISCELLANEOUS) IMPLANT
IV NS IRRIG 3000ML ARTHROMATIC (IV SOLUTION) ×1 IMPLANT
KIT SUPRAPUBIC CATH (MISCELLANEOUS) IMPLANT
MANIFOLD NEPTUNE II (INSTRUMENTS) ×3 IMPLANT
NDL HYPO 25X1 1.5 SAFETY (NEEDLE) ×1 IMPLANT
NEEDLE HYPO 25X1 1.5 SAFETY (NEEDLE) IMPLANT
PACK CYSTO (CUSTOM PROCEDURE TRAY) ×2 IMPLANT
PENCIL BUTTON HOLSTER BLD 10FT (ELECTRODE) IMPLANT
SPONGE GAUZE 4X4 12PLY (GAUZE/BANDAGES/DRESSINGS) ×1 IMPLANT
SUT SILK 0 (SUTURE) ×2
SUT SILK 0 30XBRD TIE 6 (SUTURE) ×1 IMPLANT
SUT SILK 2 0 FS (SUTURE) IMPLANT
SYRINGE IRR TOOMEY STRL 70CC (SYRINGE) IMPLANT
TAPE CLOTH SURG 4X10 WHT LF (GAUZE/BANDAGES/DRESSINGS) ×1 IMPLANT
TOWEL OR 17X26 10 PK STRL BLUE (TOWEL DISPOSABLE) ×1 IMPLANT
TUBING CONNECTING 10 (TUBING) ×2 IMPLANT
WATER STERILE IRR 3000ML UROMA (IV SOLUTION) ×1 IMPLANT

## 2013-04-04 NOTE — Anesthesia Postprocedure Evaluation (Signed)
  Anesthesia Post-op Note  Patient: [name]  Procedure(s) Performed: Procedure(s) (LRB): INSERTION OF SUPRAPUBIC CATHETER (N/A) CYSTOSCOPY WITH LITHALOPAXY (N/A)  Patient Location: PACU  Anesthesia Type: General  Level of Consciousness: awake and alert   Airway and Oxygen Therapy: Patient Spontanous Breathing  Post-op Pain: mild  Post-op Assessment: Post-op Vital signs reviewed, Patient's Cardiovascular Status Stable, Respiratory Function Stable, Patent Airway and No signs of Nausea or vomiting  Last Vitals:  Filed Vitals:   04/04/13 1032  BP: 132/82  Pulse: 60  Temp: 36.4 C  Resp: 18    Post-op Vital Signs: stable   Complications: No apparent anesthesia complications

## 2013-04-04 NOTE — Transfer of Care (Signed)
Immediate Anesthesia Transfer of Care Note  Patient: [name]  Procedure(s) Performed: Procedure(s): INSERTION OF SUPRAPUBIC CATHETER (N/A) CYSTOSCOPY WITH LITHALOPAXY (N/A)  Patient Location: PACU  Anesthesia Type:General  Level of Consciousness: awake, alert , oriented and patient cooperative  Airway & Oxygen Therapy: Patient connected to nasal cannula oxygen  Post-op Assessment: Report given to PACU RN and Post -op Vital signs reviewed and stable  Post vital signs: stable  Complications: No apparent anesthesia complications

## 2013-04-04 NOTE — Anesthesia Preprocedure Evaluation (Signed)
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    GI/Hepatic negative GI ROS, Neg liver ROS,   Endo/Other  Morbid obesityAdrenal insufficiency on chronic steroids   Renal/GU negative Renal ROS     Musculoskeletal   Abdominal (+) + obese,   Peds  Hematology  (+) Blood dyscrasia, , myeloma thoracic T8 with parpaplegia s/p thoracotomy and thoracic T7-9 cage placement on Dec 26th 2011   Anesthesia Other Findings   Reproductive/Obstetrics                           Anesthesia Physical  Anesthesia Plan  ASA: III and emergent  Anesthesia Plan: General   Post-op Pain Management:    Induction: Intravenous  Airway Management Planned: LMA  Additional Equipment:   Intra-op Plan:   Post-operative Plan:   Informed Consent: I have reviewed the patients History and Physical, chart, labs and discussed the procedure including the risks, benefits and alternatives for the proposed anesthesia with the patient or authorized representative who has indicated his/her understanding and acceptance.   Dental advisory given  Plan Discussed with: CRNA and Surgeon  Anesthesia Plan Comments:         Anesthesia Quick Evaluation

## 2013-04-04 NOTE — H&P (Signed)
[name] is an 57 y.o. male.    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 Presently managed wit foley changed Q monthly x several years.  Surveillance: 06/2012 - Cr 1.0, CT Urogram ok; 07/2012 Cysto wtih friable prostate  2 - Gross Hematuria - Non smoker. No  dye / chemical exposure. 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 CT Urogram 06/2012 normal. Cysto 07/2012 with prostatic fossa bleedig only. Placed on finasteride. He had episode 01/2013 wtih significant hematuria req admission and even 1u transfusion.   PMH sig for invasive multiple myeloma (chemo), DM2, obesity  Today [name] is seen to proceed with elective SP tube placement. Most recent UCX with psudomonas and proteus for which he has been on CX-specific keflex and Cipro to reduce colonization.   Past Medical History  Diagnosis Date  . Multiple myeloma     thoracic T8 with paraplegia s/p resection- on chemo at visit 10/13/10  . Neurogenic bladder   . Neurogenic bowel   . Partial small bowel obstruction during dec 2011 admission  . Multiple myeloma   . Multiple myeloma without mention of remission   . Cancer   . History of blood transfusion 7/14  . Paraplegia   . Gross hematuria 7/14    post foley cath procedure  . Coagulopathy     on xeralto/ s/p DVT while on coumadin,  IVC in place  . Adrenal insufficiency     on chronic dexamethasone  . Diabetes mellitus without complication     Past Surgical History  Procedure Laterality Date  . Myeloma thoracic t8 with parpaplegia s/p thoracotomy and thoracic t7-9 cage placement on dec 26th 2011  07/18/10  . Laparotomy  07/20/2011    Procedure: EXPLORATORY LAPAROTOMY;  Surgeon: Rulon Abide, DO;  Location: [facility] OR;  Service: General;  Laterality: N/A;  . Colostomy revision  07/20/2011    Procedure: COLON RESECTION  SIGMOID;  Surgeon: Rulon Abide, DO;  Location: [facility] OR;  Service: General;;  . Colostomy  07/20/2011    Procedure: COLOSTOMY;  Surgeon: Rulon Abide, DO;  Location[facility] OR;  Service: General;;    History reviewed. No pertinent family history. Social History:  reports that he has never smoked. He has never used smokeless tobacco. He reports that he does not drink alcohol or use illicit drugs.  Allergies: No Known Allergies  No prescriptions prior to admission    No results found for this or any previous visit (from the past 48 hour(s)). No results found.  Review of Systems  Constitutional: Negative.  Negative for fever and chills.  HENT: Negative.   Eyes: Negative.   Respiratory: Negative.   Cardiovascular: Negative.   Gastrointestinal: Negative for nausea, vomiting and abdominal pain.  Genitourinary: Negative.   Musculoskeletal: Negative.   Skin: Negative.   Neurological: Negative.   Endo/Heme/Allergies: Negative.   Psychiatric/Behavioral: Negative.     Height 5\' 8"  (1.727 m), weight 125.193 kg (276 lb). Physical Exam  Constitutional: He is oriented to person, place, and time. He appears well-developed and well-nourished.  Obese. Stigmata of paraplegia with some moderate LE contracutres  HENT:  Head: Normocephalic and atraumatic.  Eyes: EOM are normal. Pupils are equal, round, and reactive to light.  Neck: Normal range of motion. Neck supple.  Cardiovascular: Normal rate and regular rhythm.  Respiratory: Effort normal and breath sounds normal.  GI: Soft. Bowel sounds are normal.  Genitourinary: Penis normal.  Musculoskeletal: Normal range of motion.  Neurological: He is alert and oriented to person, place, and time.  Skin: Skin is warm and dry.  Psychiatric: He has a normal mood and affect. His behavior is normal. Judgment and thought content normal.     Assessment/Plan  1 - Neurogenic Bladder -   We rediscussed role of SPT as the simplest form of  urinary diversion as well as need for local care with catheter changes approximately [facility] and the outpatient nature of the procedure. We rediscussed long-term risks including carcinoma (catheters >10years), tube encrustation / stones, periodic tube dislodgment, leaking around tube (incomplete continence). We thenre discussed the procedure in more detail including risks such as bleeding, infection, damage to bowel, as rare risks including DVT, PE, MI, CVA and mortality. The patient voiced understanding and wants to proceed.  2 - Gross Hematuria - Prior eval c/w BPH only, now resolved on finasteride, continue.   [name] 04/04/2013, 4:49 AM

## 2013-04-04 NOTE — Progress Notes (Signed)
Pt lifted into wheelchair via hoyerlift and discharged to home via transportation.

## 2013-04-04 NOTE — Brief Op Note (Signed)
04/04/2013  9:22 AM  PATIENT:  [name]  57 y.o. male  PRE-OPERATIVE DIAGNOSIS:  NEUROGENIC BLADDER  POST-OPERATIVE DIAGNOSIS:  * No post-op diagnosis entered *  PROCEDURE:  Procedure(s): INSERTION OF SUPRAPUBIC CATHETER (N/A) CYSTOSCOPY WITH LITHALOPAXY (N/A)  SURGEON:  Surgeon(s) and Role:    * Sebastian Ache, MD - Primary  PHYSICIAN ASSISTANT:   ASSISTANTS: none   ANESTHESIA:   general  EBL:     BLOOD ADMINISTERED:none  DRAINS: 74F SP Tube to straight drain   LOCAL MEDICATIONS USED:  NONE  SPECIMEN:  Source of Specimen:  Bladder Stone   DISPOSITION OF SPECIMEN:  Discard  COUNTS:  YES  TOURNIQUET:  * No tourniquets in log *  DICTATION: .Other Dictation: Dictation Number [id number]  PLAN OF CARE: Discharge to home after PACU  PATIENT DISPOSITION:  PACU - hemodynamically stable.   Delay start of Pharmacological VTE agent (>24hrs) due to surgical blood loss or risk of bleeding: yes

## 2013-04-05 NOTE — Op Note (Signed)
NAMEAMIEL, MCCAFFREY               ACCOUNT NO.:  [id number]  MEDICAL RECORD NO.:  [id number]  LOCATION:  [facility]                         FACILITY:  [facility]  PHYSICIAN:  Sebastian Ache, MD     DATE OF BIRTH:  May 07, 1956  DATE OF PROCEDURE:  04/04/2013 DATE OF DISCHARGE:  04/04/2013                              OPERATIVE REPORT   DIAGNOSIS:  Neurogenic bladder, bladder stones.  PROCEDURE: 1. Cystoscopy with cystolitholapaxy stone less than 2.5 cm. 2. Suprapubic tube placement, Lowsley technique.  FINDINGS: 1. Unremarkable urinary bladder. 2. Mild traumatic hypospadias. 3. Moderate volume bladder stone fragments.  These appeared to have     been previous calcifications and Foley catheter balloon.  They were     successfully fragmented and irrigated completely. 4. Placement of suprapubic tube in the anterior superior wall of the     bladder.  INDICATION:  Mr. Bogacz is a very pleasant 57 year old gentleman with unfortunate history of invasive multiple myeloma with subsequent neurogenic bowel, neurogenic bladder, and paraplegia.  He is wheelchair bound.  He had been managing his bladder for some number of years with Foley catheter changed monthly.  He does have an element of BPH as well and the catheter has led to increasing prostatic irritation including gross hematuria even requiring transfusions previously in hospital admission.  He has also had some traumatic hypospadias as well. Additional options for management of neurogenic bladder were discussed including transition to suprapubic tube and he adamantly wished to proceed.  He had a CT scan given his history of prior abdominal surgery and this revealed preserved space of Retzius with preperitoneal window of sufficient size for Lowsley technique.  Informed consent was obtained and placed in medical record for SP tube.  Notably, he had been on preoperative antibiotics following his recent cultures.  PROCEDURE IN DETAIL:  The patient  being Jarid Sasso verified, procedure being cystoscopy with SP tube was confirmed.  Procedure was carried out.  Time-out was performed.  Intravenous antibiotics were administered.  General LMA anesthesia was introduced.  The patient was placed into a low lithotomy position.  Sterile field was created by prepping and draping the patient's penis, perineum, and proximal thighs as well as infraumbilical abdomen using iodine x3.  Next, cystourethroscopy was performed using 22-French rigid cystoscope with 12- degree offset lens.  Inspection of the anterior and posterior urethra revealed mild trilobar prostatic hypertrophy.  There was mild amount of traumatic hypospadias consistent with long-term Foley catheter. Inspection of the urinary bladder revealed more than expected fragments of bladder stone.  These have the appearance of prior calcifications of Foley catheter balloon.  These were too large to irrigate simply.  As such, stent graspers were used to fragment the stone coldly with a cystoscope and all fragments were irrigated and set aside for discard. Photo documentation was performed of the stone.  Following these maneuvers, all bladder stone and fragments have been irrigated.  There was no evidence of injury to the urinary bladder.  Bladder was then filled with cystometric capacity and a suitable position for SP tube was identified at a position approximately thumb's breadth above the pubic bone in the midline.  This was marked.  Incision was made directly upon this through skin and subcutaneous fibrofatty tissue.  Cystoscope was withdrawn and Lowsley device was inserted taking great care to hug the anterior wall of the bladder.  This was delivered through the incision. A 16-French silicone catheter was then brought completely through this tract to the level of urethral meatus, then followed cystoscopically to the level of urinary bladder where 10 mL of sterile water placed in  the balloon and inspection of urinary bladder revealed excellent placement into the anterior superior aspect of the bladder.  This drained well and flushed quantitatively.  It was sutured in place to the level of skin using pursestring nylon.  Next, the straight drain procedure was then terminated.  The patient tolerated the procedure well.  There were no immediate periprocedural complications.  The patient was taken to the postanesthesia care unit in a stable condition.          ______________________________ Sebastian Ache, MD     TM/MEDQ  D:  04/04/2013  T:  04/05/2013  Job:  [id number]

## 2013-04-07 ENCOUNTER — Other Ambulatory Visit: Payer: Self-pay | Admitting: Oncology

## 2013-04-07 ENCOUNTER — Encounter (HOSPITAL_COMMUNITY): Payer: Self-pay | Admitting: Urology

## 2013-04-07 DIAGNOSIS — C9001 Multiple myeloma in remission: Secondary | ICD-10-CM

## 2013-04-07 DIAGNOSIS — D649 Anemia, unspecified: Secondary | ICD-10-CM

## 2013-04-07 DIAGNOSIS — E559 Vitamin D deficiency, unspecified: Secondary | ICD-10-CM

## 2013-04-08 ENCOUNTER — Ambulatory Visit (HOSPITAL_BASED_OUTPATIENT_CLINIC_OR_DEPARTMENT_OTHER): Payer: Medicaid Other | Admitting: Hematology and Oncology

## 2013-04-08 ENCOUNTER — Other Ambulatory Visit (HOSPITAL_BASED_OUTPATIENT_CLINIC_OR_DEPARTMENT_OTHER): Payer: Medicaid Other | Admitting: Lab

## 2013-04-08 ENCOUNTER — Ambulatory Visit (HOSPITAL_COMMUNITY)
Admission: RE | Admit: 2013-04-08 | Discharge: 2013-04-08 | Disposition: A | Discharge status: Home / Self Care | Payer: Medicare (Managed Care) | Source: Ambulatory Visit | Attending: Oncology | Admitting: Oncology

## 2013-04-08 ENCOUNTER — Encounter: Payer: Self-pay | Admitting: Hematology and Oncology

## 2013-04-08 VITALS — BP 121/88 | HR 90 | Temp 97.7°F | Resp 19 | Ht 68.0 in

## 2013-04-08 DIAGNOSIS — D72829 Elevated white blood cell count, unspecified: Secondary | ICD-10-CM

## 2013-04-08 DIAGNOSIS — M954 Acquired deformity of chest and rib: Secondary | ICD-10-CM | POA: Insufficient documentation

## 2013-04-08 DIAGNOSIS — E559 Vitamin D deficiency, unspecified: Secondary | ICD-10-CM

## 2013-04-08 DIAGNOSIS — C9001 Multiple myeloma in remission: Secondary | ICD-10-CM | POA: Insufficient documentation

## 2013-04-08 DIAGNOSIS — Z981 Arthrodesis status: Secondary | ICD-10-CM | POA: Insufficient documentation

## 2013-04-08 DIAGNOSIS — D509 Iron deficiency anemia, unspecified: Secondary | ICD-10-CM

## 2013-04-08 DIAGNOSIS — I82409 Acute embolism and thrombosis of unspecified deep veins of unspecified lower extremity: Secondary | ICD-10-CM

## 2013-04-08 DIAGNOSIS — N319 Neuromuscular dysfunction of bladder, unspecified: Secondary | ICD-10-CM

## 2013-04-08 DIAGNOSIS — D649 Anemia, unspecified: Secondary | ICD-10-CM

## 2013-04-08 DIAGNOSIS — C9000 Multiple myeloma not having achieved remission: Secondary | ICD-10-CM

## 2013-04-08 LAB — COMPREHENSIVE METABOLIC PANEL (CC13)
ALT: 22 U/L (ref 0–55)
AST: 13 U/L (ref 5–34)
Albumin: 3.3 g/dL — ABNORMAL LOW (ref 3.5–5.0)
Alkaline Phosphatase: 88 U/L (ref 40–150)
BUN: 10.6 mg/dL (ref 7.0–26.0)
CO2: 27 mEq/L (ref 22–29)
Calcium: 10.2 mg/dL (ref 8.4–10.4)
Chloride: 104 mEq/L (ref 98–109)
Creatinine: 0.8 mg/dL (ref 0.7–1.3)
Glucose: 115 mg/dl (ref 70–140)
Potassium: 3.7 mEq/L (ref 3.5–5.1)
Sodium: 142 mEq/L (ref 136–145)
Total Bilirubin: 0.34 mg/dL (ref 0.20–1.20)
Total Protein: 8.1 g/dL (ref 6.4–8.3)

## 2013-04-08 LAB — CBC WITH DIFFERENTIAL/PLATELET
BASO%: 0.5 % (ref 0.0–2.0)
Basophils Absolute: 0.1 10*3/uL (ref 0.0–0.1)
EOS%: 2.5 % (ref 0.0–7.0)
Eosinophils Absolute: 0.5 10*3/uL (ref 0.0–0.5)
HCT: 35.1 % — ABNORMAL LOW (ref 38.4–49.9)
HGB: 11.6 g/dL — ABNORMAL LOW (ref 13.0–17.1)
LYMPH%: 8.9 % — ABNORMAL LOW (ref 14.0–49.0)
MCH: 24.6 pg — ABNORMAL LOW (ref 27.2–33.4)
MCHC: 33.1 g/dL (ref 32.0–36.0)
MCV: 74.1 fL — ABNORMAL LOW (ref 79.3–98.0)
MONO#: 0.7 10*3/uL (ref 0.1–0.9)
MONO%: 3.7 % (ref 0.0–14.0)
NEUT#: 16.1 10*3/uL — ABNORMAL HIGH (ref 1.5–6.5)
NEUT%: 84.4 % — ABNORMAL HIGH (ref 39.0–75.0)
Platelets: 511 10*3/uL — ABNORMAL HIGH (ref 140–400)
RBC: 4.73 10*6/uL (ref 4.20–5.82)
RDW: 19.7 % — ABNORMAL HIGH (ref 11.0–14.6)
WBC: 19.1 10*3/uL — ABNORMAL HIGH (ref 4.0–10.3)
lymph#: 1.7 10*3/uL (ref 0.9–3.3)

## 2013-04-08 LAB — IRON AND TIBC CHCC
%SAT: 7 % — ABNORMAL LOW (ref 20–55)
Iron: 23 ug/dL — ABNORMAL LOW (ref 42–163)
TIBC: 311 ug/dL (ref 202–409)
UIBC: 288 ug/dL (ref 117–376)

## 2013-04-08 LAB — LACTATE DEHYDROGENASE (CC13): LDH: 135 U/L (ref 125–245)

## 2013-04-08 LAB — FERRITIN CHCC: Ferritin: 84 ng/ml (ref 22–316)

## 2013-04-08 NOTE — Progress Notes (Signed)
[facility] OFFICE PROGRESS NOTE  Thane Edu, MD [phone] [place] [address]  DIAGNOSIS: Multiple myeloma in remission - Plan: IgG, IgA, IgM, Kappa/lambda light chains, Immunofixation electrophoresis, Protein electrophoresis, serum, CBC with Differential, Sedimentation rate, Erythropoietin, Comprehensive metabolic panel, Vitamin B12, Ferritin  Iron deficiency anemia, unspecified  Neurogenic bladder  Leukocytosis, unspecified  SUMMARY OF HEMATOLOGIC HISTORY: 1. 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 The patient underwent biopsy and cord decompression by Dr. Kerri Perches on 07/15/2010. He underwent radiation treatments which concluded by 08/22/2010. The patient received treatment with Revlimid, Decadron, and Velcade in combination with Zometa. His initial IgG levels were as high as 5570 back in February 2012. Initial 24-hour urine protein came to 318 mg, positive for IgG heavy chains and lambda light chains. I do not think the patient ever had a bone marrow or a metastatic bone survey; however, CT scans of abdomen and pelvis carried out on 12/09/2010 have shown myelomatous involvement of the bones. A CT scan of the head on 05/05/2011 was normal. The patient has had an  outstanding response to treatment. IgG level from 09/11/2012 was 904. Serum immunofixation electrophoresis continues to show monoclonal IgG lambda protein. It appears that Velcade was discontinued in September 2012 and that Revlimid and Decadron were discontinued in March 2013. 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 The patient is presently on no specific treatment for his IgG lambda multiple myeloma.  2. Paraplegia which developed in December 2011. The patient is status post  surgical decompression of the T8 paraspinal mass by Dr. Maeola Harman. He is also status post radiation treatments.  3. Neurogenic bladder and bowel.  4. 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  An inferior vena cava filter had been placed on November 10, 2010.  The patient was started on Xarelto on 06/12/2012.  5. 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  6. End-colostomy.  7. History of C. difficile.  8. Adrenal insufficiency.  9. Diabetes mellitus.  10. History of cataracts, status post surgery.  11. Cholelithiasis noted on CT scan of abdomen and pelvis from 06/24/2012.  12. Vitamin D deficiency noted on 09/11/2012 when vitamin D level was 13. 13. Hypoalbuminemia. 14. Anemia noted on 09/11/2012 with a hemoglobin of 9.3, hematocrit 28.3, and low red cell indices.  Ferritin was 46.  Iron saturation     3%.  The patient was given IV Feraheme 510 mg on 09/26/2012.  The patient also received another dose of IV Feraheme 510 mg on     11/22/2012.  The patient apparently had rigors following this second dose of IV Feraheme and needed to go to the emergency room for treatment.  He did receive IV Solu-Medrol 125 mg.  Hemoglobin and hematocrit on 01/16/2013 were 12.3 and 36.8, respectively. Thus this patient did have iron deficiency successfully treated with IV iron.  If he is to receive IV iron in the future, he may need premedication with Benadryl, Solu-Medrol and Pepcid. 15. 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  The patient's dentist is Dr. Francene Finders.  INTERVAL HISTORY: [name] 57 y.o. male returns for his diagnosis of multiple myeloma, IgG  lambda subtype. I have reviewed his records extensively and summarize his history as above. The patient had a suprapubic catheter replaced just last week. According to the  patient, into his anticoagulation therapy, he has been bleeding significantly improved the catheter unit since it has been replaced, he has no further major bleeding. He denies any new areas of back pain. He denies any fevers or chills or night sweats. His appetite is stable and he denies any recent weight loss.  I have reviewed the past medical history, past surgical history, social history and family history with the patient and they are unchanged from previous note.  ALLERGIES:  has No Known Allergies.  MEDICATIONS: has a current medication list which includes the following prescription(s): acetaminophen, chlorhexidine, cholecalciferol, dexamethasone, finasteride, latanoprost, metformin, methocarbamol, and tramadol.   REVIEW OF SYSTEMS:   Constitutional: Denies fevers, chills or abnormal weight loss Eyes: Denies blurriness of vision Ears, nose, mouth, throat, and face: Denies mucositis or sore throat Respiratory: Denies cough, dyspnea or wheezes Cardiovascular: Denies palpitation, chest discomfort or lower extremity swelling Gastrointestinal:  Denies nausea, heartburn or change in bowel habits Skin: Denies abnormal skin rashes Lymphatics: Denies new lymphadenopathy or easy bruising Neurological:Denies numbness, tingling or new weaknesses Behavioral/Psych: Mood is stable, no new changes  All other systems were reviewed with the patient and are negative.  PHYSICAL EXAMINATION: ECOG PERFORMANCE STATUS: 1 - Symptomatic but completely ambulatory  Filed Vitals:   04/08/13 1016  BP: 121/88  Pulse: 90  Temp: 97.7 F (36.5 C)  Resp: 19    GENERAL:alert, no distress and comfortable SKIN: skin color, texture, turgor are normal, no rashes or significant lesions EYES: normal, Conjunctiva are pink and non-injected, sclera clear OROPHARYNX:no exudate, no erythema and lips, buccal mucosa, and tongue normal  NECK: supple, thyroid normal size, non-tender, without nodularity LYMPH:  no  palpable lymphadenopathy in the cervical, axillary or inguinal LUNGS: clear to auscultation and percussion with normal breathing effort HEART: regular rate & rhythm and no murmurs and no lower extremity edema ABDOMEN:abdomen soft, non-tender and normal bowel sounds. Colostomy looks normal without evidence of hernia Musculoskeletal:no cyanosis of digits and no clubbing  NEURO: alert & oriented x 3 with fluent speech, no focal motor/sensory deficits GU: Suprapubic catheter is draining clear urine.  LABORATORY DATA:  I have reviewed the data as listed Results for orders placed in visit on 04/08/13 (from the past 48 hour(s))  CBC WITH DIFFERENTIAL     Status: Abnormal   Collection Time    04/08/13  8:58 AM      Result Value Range   WBC 19.1 (*) 4.0 - 10.3 10e3/uL   NEUT# 16.1 (*) 1.5 - 6.5 10e3/uL   HGB 11.6 (*) 13.0 - 17.1 g/dL   HCT 16.1 (*) 09.6 - 04.5 %   Platelets 511 (*) 140 - 400 10e3/uL   MCV 74.1 (*) 79.3 - 98.0 fL   MCH 24.6 (*) 27.2 - 33.4 pg   MCHC 33.1  32.0 - 36.0 g/dL   RBC 4.09  8.11 - 9.14 10e6/uL   RDW 19.7 (*) 11.0 - 14.6 %   lymph# 1.7  0.9 - 3.3 10e3/uL   MONO# 0.7  0.1 - 0.9 10e3/uL   Eosinophils Absolute 0.5  0.0 - 0.5 10e3/uL   Basophils Absolute 0.1  0.0 - 0.1 10e3/uL   NEUT% 84.4 (*) 39.0 - 75.0 %   LYMPH% 8.9 (*) 14.0 - 49.0 %   MONO% 3.7  0.0 - 14.0 %   EOS%  2.5  0.0 - 7.0 %   BASO% 0.5  0.0 - 2.0 %  COMPREHENSIVE METABOLIC PANEL (CC13)     Status: Abnormal   Collection Time    04/08/13  8:58 AM      Result Value Range   Sodium 142  136 - 145 mEq/L   Potassium 3.7  3.5 - 5.1 mEq/L   Chloride 104  98 - 109 mEq/L   CO2 27  22 - 29 mEq/L   Glucose 115  70 - 140 mg/dl   BUN 16.1  7.0 - 09.6 mg/dL   Creatinine 0.8  0.7 - 1.3 mg/dL   Total Bilirubin 0.45  0.20 - 1.20 mg/dL   Alkaline Phosphatase 88  40 - 150 U/L   AST 13  5 - 34 U/L   ALT 22  0 - 55 U/L   Total Protein 8.1  6.4 - 8.3 g/dL   Albumin 3.3 (*) 3.5 - 5.0 g/dL   Calcium 40.9  8.4 - 81.1  mg/dL  LACTATE DEHYDROGENASE (CC13)     Status: None   Collection Time    04/08/13  8:58 AM      Result Value Range   LDH 135  125 - 245 U/L  FERRITIN [facility]     Status: None   Collection Time    04/08/13  8:58 AM      Result Value Range   Ferritin 84  22 - 316 ng/ml  IRON AND TIBC [facility]     Status: Abnormal   Collection Time    04/08/13  8:58 AM      Result Value Range   Iron 23 (*) 42 - 163 ug/dL   TIBC 914  782 - 956 ug/dL   UIBC 213  086 - 578 ug/dL   %SAT 7 (*) 20 - 55 %      RADIOGRAPHIC STUDIES: I have personally reviewed the radiological images as listed and agreed with the findings in the report. Dg Bone Survey Met  04/08/2013   CLINICAL DATA:  Multiple myeloma.  EXAM: METASTATIC BONE SURVEY  COMPARISON:  None.  FINDINGS: Patient is status post corpectomy and fusion in the mid thoracic spine. No visible suspicious osseous lesions. Degenerative changes throughout the lower thoracic and lumbar spine and in the hips. IVC filter projects over the mid abdomen. Mild degenerative changes in the [facility] joints bilaterally.  Heart is normal size. Lungs are clear. Deformity of the left 6th rib, presumably postoperative or posttraumatic.  IMPRESSION: No suspicious focal bony abnormality.  Chronic changes as above.   Electronically Signed   By: Charlett Nose M.D.   On: 04/08/2013 12:36   ASSESSMENT: Multiple myeloma, in remission.  PLAN:  #1 multiple myeloma The patient has been adequately treated since 2011. Repeat serum IgG, lambda light chain, and electrophoresis are not yet available. His skeletal survey from today show no evidence of new lytic lesions. The patient is asymptomatic. He had evidence of mild anemia but no evidence of hypercalcemia or renal failure. In the final results of his blood work did not suggest any evidence of recurrence, I recommend we continue to follow him every 3 months with history, physical examination, and blood work. typically I would order skeletal survey once a  year unless he is symptomatic. #2 microcytic anemia The patient is on anticoagulation therapy due to his history of bilateral DVT. He had intermittent hematuria and has received iron infusion in the pas.  He is still anemic however, is not  as severe as before, and I recommend reviewed just observe for now. Previously, he developed an infusion reaction to and I recommend the use iron sucrose in the future if needed. #3 neurogenic bladder The patient had recent replacement of a suprapubic catheter. Hopefully this will reduce his risk of bleeding. #4 leukocytosis It could be due to his corticosteroid therapy that he takes for history of adrenal insufficiency, all related to recent surgery. There are no signs or symptoms such as suggest active infection. We will observe for now.  #5 history of DVT The patient had an IVC filter placed 2 years ago. He is currently on anti-coagulation therapy. Although he had some minor bleeding the risk of recurrence of DVT is high and will continue anticoagulation for now.  All questions were answered. The patient knows to call the clinic with any problems, questions or concerns. We can certainly see the patient much sooner if necessary. No barriers to learning was detected.  The patient and plan discussed with [facility], Meloni Hinz  and he is in agreement with the aforementioned.  I spent 40 minutes counseling the patient face to face. The total time spent in the appointment was 60 minutes and more than 50% was on counseling.     [facility], Jerrad Mendibles, MD 04/08/2013 2:23 PM

## 2013-04-09 LAB — IGG, IGA, IGM
IgA: 146 mg/dL (ref 68–379)
IgG (Immunoglobin G), Serum: 1370 mg/dL (ref 650–1600)
IgM, Serum: 77 mg/dL (ref 41–251)

## 2013-04-09 LAB — VITAMIN D 25 HYDROXY (VIT D DEFICIENCY, FRACTURES): Vit D, 25-Hydroxy: 59 ng/mL (ref 30–89)

## 2013-04-09 LAB — KAPPA/LAMBDA LIGHT CHAINS
Kappa free light chain: 1.36 mg/dL (ref 0.33–1.94)
Kappa:Lambda Ratio: 0.7 (ref 0.26–1.65)
Lambda Free Lght Chn: 1.93 mg/dL (ref 0.57–2.63)

## 2013-04-09 LAB — BETA 2 MICROGLOBULIN, SERUM: Beta-2 Microglobulin: 1.89 mg/L — ABNORMAL HIGH (ref 1.01–1.73)

## 2013-04-11 ENCOUNTER — Telehealth: Payer: Self-pay | Admitting: Hematology and Oncology

## 2013-04-11 NOTE — Telephone Encounter (Signed)
, °

## 2013-04-14 ENCOUNTER — Institutional Professional Consult (permissible substitution): Payer: Medicare (Managed Care) | Admitting: Pulmonary Disease

## 2013-05-12 ENCOUNTER — Encounter (HOSPITAL_COMMUNITY): Admission: RE | Payer: Self-pay | Source: Ambulatory Visit

## 2013-05-12 ENCOUNTER — Ambulatory Visit (HOSPITAL_COMMUNITY)
Admission: RE | Admit: 2013-05-12 | Payer: Medicare (Managed Care) | Source: Ambulatory Visit | Admitting: Oral and Maxillofacial Surgery

## 2013-05-12 SURGERY — OSTEOTOMY, MANDIBLE
Anesthesia: General | Laterality: Right

## 2013-07-02 ENCOUNTER — Other Ambulatory Visit: Payer: Self-pay | Admitting: Dermatology

## 2013-07-09 ENCOUNTER — Other Ambulatory Visit: Payer: Medicare (Managed Care)

## 2013-07-09 ENCOUNTER — Ambulatory Visit: Payer: Medicare (Managed Care) | Admitting: Hematology and Oncology

## 2013-07-11 ENCOUNTER — Encounter: Payer: Self-pay | Admitting: *Deleted

## 2013-07-23 ENCOUNTER — Telehealth: Payer: Self-pay | Admitting: *Deleted

## 2013-07-23 ENCOUNTER — Encounter: Payer: Self-pay | Admitting: Hematology and Oncology

## 2013-07-23 ENCOUNTER — Telehealth: Payer: Self-pay | Admitting: Hematology and Oncology

## 2013-07-23 ENCOUNTER — Other Ambulatory Visit (HOSPITAL_BASED_OUTPATIENT_CLINIC_OR_DEPARTMENT_OTHER): Payer: Medicare (Managed Care)

## 2013-07-23 ENCOUNTER — Ambulatory Visit (HOSPITAL_BASED_OUTPATIENT_CLINIC_OR_DEPARTMENT_OTHER): Payer: Medicare (Managed Care) | Admitting: Hematology and Oncology

## 2013-07-23 ENCOUNTER — Encounter: Payer: Self-pay | Admitting: *Deleted

## 2013-07-23 VITALS — BP 123/86 | HR 91 | Temp 97.3°F | Resp 20

## 2013-07-23 DIAGNOSIS — C9000 Multiple myeloma not having achieved remission: Secondary | ICD-10-CM

## 2013-07-23 DIAGNOSIS — Z86718 Personal history of other venous thrombosis and embolism: Secondary | ICD-10-CM

## 2013-07-23 DIAGNOSIS — D649 Anemia, unspecified: Secondary | ICD-10-CM

## 2013-07-23 DIAGNOSIS — C9001 Multiple myeloma in remission: Secondary | ICD-10-CM

## 2013-07-23 DIAGNOSIS — D509 Iron deficiency anemia, unspecified: Secondary | ICD-10-CM

## 2013-07-23 DIAGNOSIS — N319 Neuromuscular dysfunction of bladder, unspecified: Secondary | ICD-10-CM

## 2013-07-23 DIAGNOSIS — D72829 Elevated white blood cell count, unspecified: Secondary | ICD-10-CM

## 2013-07-23 LAB — CBC WITH DIFFERENTIAL/PLATELET
BASO%: 0.9 % (ref 0.0–2.0)
Basophils Absolute: 0.2 10*3/uL — ABNORMAL HIGH (ref 0.0–0.1)
EOS%: 3 % (ref 0.0–7.0)
Eosinophils Absolute: 0.5 10*3/uL (ref 0.0–0.5)
HCT: 32.4 % — ABNORMAL LOW (ref 38.4–49.9)
HGB: 10.9 g/dL — ABNORMAL LOW (ref 13.0–17.1)
LYMPH%: 11.3 % — ABNORMAL LOW (ref 14.0–49.0)
MCH: 22.9 pg — ABNORMAL LOW (ref 27.2–33.4)
MCHC: 33.6 g/dL (ref 32.0–36.0)
MCV: 68.2 fL — ABNORMAL LOW (ref 79.3–98.0)
MONO#: 0.9 10*3/uL (ref 0.1–0.9)
MONO%: 5.4 % (ref 0.0–14.0)
NEUT#: 13.7 10*3/uL — ABNORMAL HIGH (ref 1.5–6.5)
NEUT%: 79.4 % — ABNORMAL HIGH (ref 39.0–75.0)
Platelets: 485 10*3/uL — ABNORMAL HIGH (ref 140–400)
RBC: 4.75 10*6/uL (ref 4.20–5.82)
RDW: 22.5 % — ABNORMAL HIGH (ref 11.0–14.6)
WBC: 17.2 10*3/uL — ABNORMAL HIGH (ref 4.0–10.3)
lymph#: 1.9 10*3/uL (ref 0.9–3.3)

## 2013-07-23 LAB — COMPREHENSIVE METABOLIC PANEL (CC13)
ALT: 65 U/L — ABNORMAL HIGH (ref 0–55)
AST: 37 U/L — ABNORMAL HIGH (ref 5–34)
Albumin: 3 g/dL — ABNORMAL LOW (ref 3.5–5.0)
Alkaline Phosphatase: 106 U/L (ref 40–150)
Anion Gap: 12 mEq/L — ABNORMAL HIGH (ref 3–11)
BUN: 8.4 mg/dL (ref 7.0–26.0)
CO2: 20 mEq/L — ABNORMAL LOW (ref 22–29)
Calcium: 9.3 mg/dL (ref 8.4–10.4)
Chloride: 109 mEq/L (ref 98–109)
Creatinine: 0.8 mg/dL (ref 0.7–1.3)
Glucose: 123 mg/dl (ref 70–140)
Potassium: 3.7 mEq/L (ref 3.5–5.1)
Sodium: 141 mEq/L (ref 136–145)
Total Bilirubin: 0.34 mg/dL (ref 0.20–1.20)
Total Protein: 7.3 g/dL (ref 6.4–8.3)

## 2013-07-23 LAB — FERRITIN CHCC: Ferritin: 78 ng/ml (ref 22–316)

## 2013-07-23 NOTE — Progress Notes (Signed)
[name] OFFICE PROGRESS NOTE  Patient Care Team: Jethro Bastos, MD as PCP - General (Family Medicine) Pierce Crane, MD as Consulting Physician (Hematology and Oncology) Maeola Harman, MD as Consulting Physician (Neurosurgery) Ranelle Oyster, MD as Consulting Physician (Physical Medicine and Rehabilitation) Charlton Amor, PA-C as Physician Assistant (Physical Medicine and Rehabilitation)  DIAGNOSIS: History of multiple myeloma, chronic iron deficiency anemia  SUMMARY OF ONCOLOGIC HISTORY: 1. 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 The patient underwent biopsy and cord decompression by Dr. Kerri Perches on 07/15/2010. He underwent radiation treatments which concluded by 08/22/2010. The patient received treatment with Revlimid, Decadron, and Velcade in combination with Zometa. His initial IgG levels were as high as 5570 back in February 2012. Initial 24-hour urine protein came to 318 mg, positive for IgG heavy chains and lambda light chains. I do not think the patient ever had a bone marrow or a metastatic bone survey; however, CT scans of abdomen and pelvis carried out on 12/09/2010 have shown myelomatous involvement of the bones. A CT scan of the head on 05/05/2011 was normal. The patient has had an  outstanding response to treatment. IgG level from 09/11/2012 was 904. Serum immunofixation electrophoresis continues to show monoclonal IgG lambda protein. It appears that Velcade was discontinued in September 2012 and that Revlimid and Decadron were discontinued in March 2013. 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 The patient is presently on no specific treatment for his IgG lambda multiple myeloma.  2. Paraplegia which developed in December 2011. The patient is  status post surgical decompression of the T8 paraspinal mass by Dr. Maeola Harman. He is also status post radiation treatments.  3. Neurogenic bladder and bowel.  4. 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  An inferior vena cava filter had been placed on November 10, 2010.  The patient was started on Xarelto on 06/12/2012.  5. 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  6. End-colostomy.  7. History of C. difficile.  8. Adrenal insufficiency.  9. Diabetes mellitus.  10. History of cataracts, status post surgery.  11. Cholelithiasis noted on CT scan of abdomen and pelvis from 06/24/2012.  12. Vitamin D deficiency noted on 09/11/2012 when vitamin D level was 13. 13. Hypoalbuminemia. 14. Anemia noted on 09/11/2012 with a hemoglobin of 9.3, hematocrit 28.3, and low red cell indices.  Ferritin was 46.  Iron saturation     3%.  The patient was given IV Feraheme 510 mg on 09/26/2012.  The patient also received another dose of IV Feraheme 510 mg on     11/22/2012.  The patient apparently had rigors following this second dose of IV Feraheme and needed to go to the emergency room for treatment.  He did receive IV Solu-Medrol 125 mg.  Hemoglobin and hematocrit on 01/16/2013 were 12.3 and 36.8, respectively. Thus this patient did have iron deficiency successfully treated with IV iron.  If he is to receive IV iron in the future, he may need premedication with Benadryl, Solu-Medrol and Pepcid. 15. 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  The patient's dentist is Dr. Tanna Savoy [facility].  INTERVAL HISTORY: [name] 57 y.o.  male returns for further followup. He's doing well. He has no recent infection. For some reason his anticoagulation therapy was discontinued. Has good appetite. Denies any new bone pain He denies any recent fever,  chills, night sweats or abnormal weight loss  I have reviewed the past medical history, past surgical history, social history and family history with the patient and they are unchanged from previous note.  ALLERGIES:  has No Known Allergies.  MEDICATIONS:  Current Outpatient Prescriptions  Medication Sig Dispense Refill  . acetaminophen (TYLENOL) 325 MG tablet Take 325-650 mg by mouth every 6 (six) hours as needed. For pain.      . chlorhexidine (PERIDEX) 0.12 % solution Use as directed 15 mLs in the mouth or throat See admin instructions. He is to use 5-6 times daily for exposure to bone in his mouth.      . cholecalciferol (VITAMIN D) 1000 UNITS tablet Take 1,000 Units by mouth daily.      Marland Kitchen dexamethasone (DECADRON) 0.75 MG tablet Take 0.75-1.5 mg by mouth daily. He will be taking two tablets on Monday and Thursday and one tablet the rest of the week.      . finasteride (PROSCAR) 5 MG tablet Take 5 mg by mouth daily.      Marland Kitchen latanoprost (XALATAN) 0.005 % ophthalmic solution Place 1 drop into both eyes at bedtime.      . metFORMIN (GLUCOPHAGE) 500 MG tablet Take 500 mg by mouth 2 (two) times daily with a meal.      . methocarbamol (ROBAXIN) 500 MG tablet Take 500 mg by mouth every 6 (six) hours as needed. For muscle spasms.      . traMADol (ULTRAM) 50 MG tablet Take 1 tablet (50 mg total) by mouth every 6 (six) hours as needed for pain. Postoperatively  30 tablet  1   No current facility-administered medications for this visit.    REVIEW OF SYSTEMS:   Constitutional: Denies fevers, chills or abnormal weight loss Eyes: Denies blurriness of vision Ears, nose, mouth, throat, and face: Denies mucositis or sore throat Respiratory: Denies cough, dyspnea or wheezes Cardiovascular: Denies palpitation, chest discomfort or lower extremity swelling Gastrointestinal:  Denies nausea, heartburn or change in bowel habits Skin: Denies abnormal skin rashes Lymphatics: Denies new lymphadenopathy or easy  bruising Neurological:Denies numbness, tingling or new weaknesses Behavioral/Psych: Mood is stable, no new changes  All other systems were reviewed with the patient and are negative.  PHYSICAL EXAMINATION: ECOG PERFORMANCE STATUS: 0 - Asymptomatic  Filed Vitals:   07/23/13 0906  BP: 123/86  Pulse: 91  Temp: 97.3 F (36.3 C)  Resp: 20   There were no vitals filed for this visit.  GENERAL:alert, no distress and comfortable SKIN: skin color, texture, turgor are normal, no rashes or significant lesions EYES: normal, Conjunctiva are pink and non-injected, sclera clear OROPHARYNX:no exudate, no erythema and lips, buccal mucosa, and tongue normal  NECK: supple, thyroid normal size, non-tender, without nodularity LYMPH:  no palpable lymphadenopathy in the cervical, axillary or inguinal LUNGS: clear to auscultation and percussion with normal breathing effort HEART: regular rate & rhythm and no murmurs and no lower extremity edema ABDOMEN:abdomen soft, non-tender and normal bowel sounds Musculoskeletal:no cyanosis of digits and no clubbing  NEURO: alert & oriented x 3 with fluent speech, no focal motor/sensory deficits  LABORATORY DATA:  I have reviewed the data as listed    Component Value Date/Time   NA 141 07/23/2013 0847   NA 140 04/04/2013 0740  K 3.7 07/23/2013 0847   K 4.3 04/04/2013 0740   CL 104 04/04/2013 0740   CL 104 11/15/2012 0823   CO2 20* 07/23/2013 0847   CO2 27 04/04/2013 0740   GLUCOSE 123 07/23/2013 0847   GLUCOSE 94 04/04/2013 0740   GLUCOSE 141* 11/15/2012 0823   BUN 8.4 07/23/2013 0847   BUN 12 04/04/2013 0740   CREATININE 0.8 07/23/2013 0847   CREATININE 0.80 04/04/2013 0740   CALCIUM 9.3 07/23/2013 0847   CALCIUM 10.0 04/04/2013 0740   PROT 7.3 07/23/2013 0847   PROT 7.0 02/13/2012 1029   ALBUMIN 3.0* 07/23/2013 0847   ALBUMIN 3.8 02/13/2012 1029   AST 37* 07/23/2013 0847   AST 25 02/13/2012 1029   ALT 65* 07/23/2013 0847   ALT 52 02/13/2012 1029   ALKPHOS  106 07/23/2013 0847   ALKPHOS 117 02/13/2012 1029   BILITOT 0.34 07/23/2013 0847   BILITOT 0.3 02/13/2012 1029   GFRNONAA >90 04/04/2013 0740   GFRAA >90 04/04/2013 0740    No results found for this basename: SPEP,  UPEP,   kappa and lambda light chains    Lab Results  Component Value Date   WBC 17.2* 07/23/2013   NEUTROABS 13.7* 07/23/2013   HGB 10.9* 07/23/2013   HCT 32.4* 07/23/2013   MCV 68.2* 07/23/2013   PLT 485* 07/23/2013      Chemistry      Component Value Date/Time   NA 141 07/23/2013 0847   NA 140 04/04/2013 0740   K 3.7 07/23/2013 0847   K 4.3 04/04/2013 0740   CL 104 04/04/2013 0740   CL 104 11/15/2012 0823   CO2 20* 07/23/2013 0847   CO2 27 04/04/2013 0740   BUN 8.4 07/23/2013 0847   BUN 12 04/04/2013 0740   CREATININE 0.8 07/23/2013 0847   CREATININE 0.80 04/04/2013 0740   GLU 166* 09/19/2010 1538      Component Value Date/Time   CALCIUM 9.3 07/23/2013 0847   CALCIUM 10.0 04/04/2013 0740   ALKPHOS 106 07/23/2013 0847   ALKPHOS 117 02/13/2012 1029   AST 37* 07/23/2013 0847   AST 25 02/13/2012 1029   ALT 65* 07/23/2013 0847   ALT 52 02/13/2012 1029   BILITOT 0.34 07/23/2013 0847   BILITOT 0.3 02/13/2012 1029     ASSESSMENT & PLAN:  #1 multiple myeloma The patient has been adequately treated since 2011. Repeat serum IgG, lambda light chain, and electrophoresis are not yet available. His skeletal survey from today September show no evidence of new lytic lesions. The patient is asymptomatic. He had evidence of mild anemia but no evidence of hypercalcemia or renal failure. I recommend we continue to follow him every 3 months with history, physical examination, and blood work. typically I would order skeletal survey once a year unless he is symptomatic. #2 microcytic anemia The patient was on anticoagulation therapy due to his history of bilateral DVT. He had intermittent hematuria and has received iron infusion in the pas.  He is still anemic however, is not as severe  as before, and I recommend reviewed just observe for now. Previously, he developed an infusion reaction to and I recommend the use iron sucrose in the future if needed. #3 neurogenic bladder The patient had recent replacement of a suprapubic catheter. Hopefully this will reduce his risk of bleeding. #4 leukocytosis It could be due to his corticosteroid therapy that he takes for history of adrenal insufficiency, all related to recent surgery. There are no signs or symptoms  such as suggest active infection. We will observe for now.  #5 history of DVT The patient had an IVC filter placed 2 years ago. He is currently not on anti-coagulation therapy.  I had a long discussion with the patient today explaining why he should be on anticoagulation therapy. We discussed the risk and benefit of being on aspirin for prevention of blood clot. The patient will think about it.  Orders Placed This Encounter  Procedures  . Comprehensive metabolic panel    Standing Status: Future     Number of Occurrences:      Standing Expiration Date: 07/23/2014  . Beta 2 microglobuline, serum    Standing Status: Future     Number of Occurrences:      Standing Expiration Date: 07/23/2014  . Kappa/lambda light chains    Standing Status: Future     Number of Occurrences:      Standing Expiration Date: 07/23/2014  . SPEP & IFE with QIG    Standing Status: Future     Number of Occurrences:      Standing Expiration Date: 07/23/2014  . Smear    Standing Status: Future     Number of Occurrences:      Standing Expiration Date: 07/23/2014  . Sedimentation rate    Standing Status: Future     Number of Occurrences:      Standing Expiration Date: 07/23/2014  . Ferritin    Standing Status: Future     Number of Occurrences:      Standing Expiration Date: 07/23/2014  . Iron and TIBC    Standing Status: Future     Number of Occurrences:      Standing Expiration Date: 07/23/2014  . CBC & Diff and Retic    Standing Status:  Future     Number of Occurrences:      Standing Expiration Date: 07/23/2014   All questions were answered. The patient knows to call the clinic with any problems, questions or concerns. No barriers to learning was detected. I spent 25 minutes counseling the patient face to face. The total time spent in the appointment was 40 minutes and more than 50% was on counseling and review of test results     [facility], Erykah Lippert, MD 07/23/2013 10:53 AM

## 2013-07-23 NOTE — Telephone Encounter (Signed)
appts made per 12/31 POF AVS and CAL given shh °

## 2013-07-23 NOTE — Telephone Encounter (Signed)
Call from Dr. Dorothe Pea,  Pt's PCP at [facility].  He states pt is poor historian and wants Dr. Bertis Ruddy to know that pt is actually on Xarelto 20 mg daily.  Xarelto was held for one month back in Sept when pt had Suprapubic Catheter placed, but he is currently taking.   Clarified pt's dexamethasone is 0.75 mg daily.   Dr. Dorothe Pea can be reached at ph [phone].

## 2013-07-28 LAB — SPEP & IFE WITH QIG
Albumin ELP: 48.9 % — ABNORMAL LOW (ref 55.8–66.1)
Alpha-1-Globulin: 6.9 % — ABNORMAL HIGH (ref 2.9–4.9)
Alpha-2-Globulin: 16.3 % — ABNORMAL HIGH (ref 7.1–11.8)
Beta 2: 6.7 % — ABNORMAL HIGH (ref 3.2–6.5)
Beta Globulin: 6.3 % (ref 4.7–7.2)
Gamma Globulin: 14.9 % (ref 11.1–18.8)
IgA: 182 mg/dL (ref 68–379)
IgG (Immunoglobin G), Serum: 1160 mg/dL (ref 650–1600)
IgM, Serum: 91 mg/dL (ref 41–251)
M-Spike, %: 0.3 g/dL
Total Protein, Serum Electrophoresis: 6.9 g/dL (ref 6.0–8.3)

## 2013-07-28 LAB — VITAMIN B12: Vitamin B-12: 445 pg/mL (ref 211–911)

## 2013-07-28 LAB — KAPPA/LAMBDA LIGHT CHAINS
Kappa free light chain: 2.44 mg/dL — ABNORMAL HIGH (ref 0.33–1.94)
Kappa:Lambda Ratio: 1.64 (ref 0.26–1.65)
Lambda Free Lght Chn: 1.49 mg/dL (ref 0.57–2.63)

## 2013-07-28 LAB — SEDIMENTATION RATE: Sed Rate: 55 mm/hr — ABNORMAL HIGH (ref 0–16)

## 2013-07-28 LAB — ERYTHROPOIETIN: Erythropoietin: 41.6 m[IU]/mL — ABNORMAL HIGH (ref 2.6–18.5)

## 2013-10-21 ENCOUNTER — Other Ambulatory Visit (HOSPITAL_BASED_OUTPATIENT_CLINIC_OR_DEPARTMENT_OTHER): Payer: Medicare (Managed Care)

## 2013-10-21 ENCOUNTER — Ambulatory Visit (HOSPITAL_BASED_OUTPATIENT_CLINIC_OR_DEPARTMENT_OTHER): Payer: Medicare (Managed Care) | Admitting: Hematology and Oncology

## 2013-10-21 VITALS — BP 121/79 | HR 97 | Temp 97.7°F | Resp 20

## 2013-10-21 DIAGNOSIS — C9001 Multiple myeloma in remission: Secondary | ICD-10-CM

## 2013-10-21 DIAGNOSIS — D649 Anemia, unspecified: Secondary | ICD-10-CM

## 2013-10-21 DIAGNOSIS — D509 Iron deficiency anemia, unspecified: Secondary | ICD-10-CM

## 2013-10-21 DIAGNOSIS — Z86718 Personal history of other venous thrombosis and embolism: Secondary | ICD-10-CM

## 2013-10-21 DIAGNOSIS — C9000 Multiple myeloma not having achieved remission: Secondary | ICD-10-CM

## 2013-10-21 DIAGNOSIS — N319 Neuromuscular dysfunction of bladder, unspecified: Secondary | ICD-10-CM

## 2013-10-21 DIAGNOSIS — R21 Rash and other nonspecific skin eruption: Secondary | ICD-10-CM

## 2013-10-21 DIAGNOSIS — D72829 Elevated white blood cell count, unspecified: Secondary | ICD-10-CM

## 2013-10-21 DIAGNOSIS — Z7901 Long term (current) use of anticoagulants: Secondary | ICD-10-CM

## 2013-10-21 LAB — CBC & DIFF AND RETIC
BASO%: 0.3 % (ref 0.0–2.0)
Basophils Absolute: 0 10*3/uL (ref 0.0–0.1)
EOS%: 5.8 % (ref 0.0–7.0)
Eosinophils Absolute: 0.6 10*3/uL — ABNORMAL HIGH (ref 0.0–0.5)
HCT: 32.8 % — ABNORMAL LOW (ref 38.4–49.9)
HGB: 10.8 g/dL — ABNORMAL LOW (ref 13.0–17.1)
Immature Retic Fract: 16 % — ABNORMAL HIGH (ref 3.00–10.60)
LYMPH%: 17.4 % (ref 14.0–49.0)
MCH: 22.4 pg — ABNORMAL LOW (ref 27.2–33.4)
MCHC: 32.9 g/dL (ref 32.0–36.0)
MCV: 68 fL — ABNORMAL LOW (ref 79.3–98.0)
MONO#: 0.7 10*3/uL (ref 0.1–0.9)
MONO%: 6.1 % (ref 0.0–14.0)
NEUT#: 7.6 10*3/uL — ABNORMAL HIGH (ref 1.5–6.5)
NEUT%: 70.4 % (ref 39.0–75.0)
Platelets: 476 10*3/uL — ABNORMAL HIGH (ref 140–400)
RBC: 4.82 10*6/uL (ref 4.20–5.82)
RDW: 17.6 % — ABNORMAL HIGH (ref 11.0–14.6)
Retic %: 0.99 % (ref 0.80–1.80)
Retic Ct Abs: 47.72 10*3/uL (ref 34.80–93.90)
WBC: 10.8 10*3/uL — ABNORMAL HIGH (ref 4.0–10.3)
lymph#: 1.9 10*3/uL (ref 0.9–3.3)

## 2013-10-21 LAB — COMPREHENSIVE METABOLIC PANEL (CC13)
ALT: 23 U/L (ref 0–55)
AST: 15 U/L (ref 5–34)
Albumin: 3 g/dL — ABNORMAL LOW (ref 3.5–5.0)
Alkaline Phosphatase: 73 U/L (ref 40–150)
Anion Gap: 10 mEq/L (ref 3–11)
BUN: 9.4 mg/dL (ref 7.0–26.0)
CO2: 24 mEq/L (ref 22–29)
Calcium: 9.6 mg/dL (ref 8.4–10.4)
Chloride: 108 mEq/L (ref 98–109)
Creatinine: 0.8 mg/dL (ref 0.7–1.3)
Glucose: 118 mg/dl (ref 70–140)
Potassium: 3.7 mEq/L (ref 3.5–5.1)
Sodium: 142 mEq/L (ref 136–145)
Total Bilirubin: 0.34 mg/dL (ref 0.20–1.20)
Total Protein: 7.3 g/dL (ref 6.4–8.3)

## 2013-10-21 LAB — CHCC SMEAR

## 2013-10-21 LAB — IRON AND TIBC CHCC
%SAT: 12 % — ABNORMAL LOW (ref 20–55)
Iron: 31 ug/dL — ABNORMAL LOW (ref 42–163)
TIBC: 268 ug/dL (ref 202–409)
UIBC: 237 ug/dL (ref 117–376)

## 2013-10-21 LAB — FERRITIN CHCC: Ferritin: 56 ng/ml (ref 22–316)

## 2013-10-21 NOTE — Progress Notes (Signed)
[facility] OFFICE PROGRESS NOTE  Patient Care Team: [name], MD as PCP - General (Family Medicine) [name], MD as Consulting Physician (Hematology and Oncology) [name], MD as Consulting Physician (Physical Medicine and Rehabilitation) [name], PA-C as Physician Assistant (Physical Medicine and Rehabilitation) [name], MD as Consulting Physician (Hematology and Oncology)  DIAGNOSIS: IgG lambda multiple myeloma and chronic iron deficiency anemia  SUMMARY OF ONCOLOGIC HISTORY: 1. 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 The patient underwent biopsy and cord decompression by Dr. Dierdre Harness on 07/15/2010. He underwent radiation treatments which concluded by 08/22/2010. The patient received treatment with Revlimid, Decadron, and Velcade in combination with Zometa. His initial IgG levels were as high as 5570 back in February 2012. Initial 24-hour urine protein came to 318 mg, positive for IgG heavy chains and lambda light chains. I do not think the patient ever had a bone marrow or a metastatic bone survey; however, CT scans of abdomen and pelvis carried out on 12/09/2010 have shown myelomatous involvement of the bones. A CT scan of the head on 05/05/2011 was normal. The patient has had an  outstanding response to treatment. IgG level from 09/11/2012 was 904. Serum immunofixation electrophoresis continues to show monoclonal IgG lambda protein. It appears that Velcade was discontinued in September 2012 and that Revlimid and Decadron were discontinued in March 2013. 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 The patient is presently on no specific treatment for his IgG lambda multiple myeloma.  2. Paraplegia which developed in December 2011. The  patient is status post surgical decompression of the T8 paraspinal mass by Dr. Erline Levine. He is also status post radiation treatments. The patient had chronic neurogenic bladder and bowel. He has placement of suprapubic catheter. 3. 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 He had an inferior vena cava filter had been placed on November 10, 2010 and he has been on Xarelto since 06/12/2012.  4. 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  5. He also has adrenal insufficiency on chronic prednisone therapy complicated by diabetes mellitus.  6.He has chronic anemia noted since 2014 treated with IV Feraheme and had infusion reactions.  7. 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 Isac Caddy.  INTERVAL HISTORY: [name] 58 y.o. male returns for follow-up with his wife. He denies recent infection. He is compliant with anticoagulation therapy. The patient denies any recent signs or symptoms of bleeding such as spontaneous epistaxis, hematuria or hematochezia. He has frequent suprapubic catheter change to prevent recurrent UTI. Denies new bone pain.  I have reviewed the past medical history, past surgical history, social history and family history with the patient and they are unchanged from previous note.  ALLERGIES:  has No Known Allergies.  MEDICATIONS:  Current Outpatient Prescriptions  Medication Sig Dispense Refill  . acetaminophen (TYLENOL) 325 MG tablet Take 325-650 mg by mouth every 6 (six) hours as needed. For pain.      . cholecalciferol (VITAMIN D) 1000 UNITS tablet Take 1,000 Units by mouth daily.      Marland Kitchen dexamethasone (DECADRON) 0.75 MG tablet Take 0.75  mg by mouth daily. One tablet on Mon, Wed and Fridays.  One and half tablet rest of the week.      . latanoprost  (XALATAN) 0.005 % ophthalmic solution Place 1 drop into both eyes at bedtime.      . metFORMIN (GLUCOPHAGE) 500 MG tablet Take 500 mg by mouth 2 (two) times daily with a meal.      . methocarbamol (ROBAXIN) 500 MG tablet Take 500 mg by mouth every 6 (six) hours as needed. For muscle spasms.      . Rivaroxaban (XARELTO) 20 MG TABS tablet Take 20 mg by mouth daily with supper.       No current facility-administered medications for this visit.    REVIEW OF SYSTEMS:   Constitutional: Denies fevers, chills or abnormal weight loss Eyes: Denies blurriness of vision Ears, nose, mouth, throat, and face: Denies mucositis or sore throat Respiratory: Denies cough, dyspnea or wheezes Cardiovascular: Denies palpitation, chest discomfort or lower extremity swelling Gastrointestinal:  Denies nausea, heartburn or change in bowel habits Skin: He complained of skin rashes on his abdomen Lymphatics: Denies new lymphadenopathy or easy bruising Neurological:Denies numbness, tingling or new weaknesses Behavioral/Psych: Mood is stable, no new changes  All other systems were reviewed with the patient and are negative.  PHYSICAL EXAMINATION: ECOG PERFORMANCE STATUS: 0 - Asymptomatic  Filed Vitals:   10/21/13 0842  BP: 121/79  Pulse: 97  Temp: 97.7 F (36.5 C)  Resp: 20   There were no vitals filed for this visit.  GENERAL:alert, no distress and comfortable. He is morbidly obese, sitting on a wheel chair. SKIN: multiple skin patches on his abdomen resembling some form of dermatitis EYES: normal, Conjunctiva are pink and non-injected, sclera clear OROPHARYNX:no exudate, no erythema and lips, buccal mucosa, and tongue normal  NECK: supple, thyroid normal size, non-tender, without nodularity LYMPH:  no palpable lymphadenopathy in the cervical, axillary or inguinal LUNGS: clear to auscultation and percussion with normal breathing effort HEART: regular rate & rhythm and no murmurs and no lower extremity  edema ABDOMEN:abdomen soft, non-tender and normal bowel sounds. Left lower abdominal stoma is well functioning. The suprapubic catheter is full of casts. Musculoskeletal:no cyanosis of digits and no clubbing  NEURO: alert & oriented x 3 with fluent speech, with good upper extremity strength  LABORATORY DATA:  I have reviewed the data as listed    Component Value Date/Time   NA 142 10/21/2013 0824   NA 140 04/04/2013 0740   K 3.7 10/21/2013 0824   K 4.3 04/04/2013 0740   CL 104 04/04/2013 0740   CL 104 11/15/2012 0823   CO2 24 10/21/2013 0824   CO2 27 04/04/2013 0740   GLUCOSE 118 10/21/2013 0824   GLUCOSE 94 04/04/2013 0740   GLUCOSE 141* 11/15/2012 0823   BUN 9.4 10/21/2013 0824   BUN 12 04/04/2013 0740   CREATININE 0.8 10/21/2013 0824   CREATININE 0.80 04/04/2013 0740   CALCIUM 9.6 10/21/2013 0824   CALCIUM 10.0 04/04/2013 0740   PROT 7.3 10/21/2013 0824   PROT 7.0 02/13/2012 1029   ALBUMIN 3.0* 10/21/2013 0824   ALBUMIN 3.8 02/13/2012 1029   AST 15 10/21/2013 0824   AST 25 02/13/2012 1029   ALT 23 10/21/2013 0824   ALT 52 02/13/2012 1029   ALKPHOS 73 10/21/2013 0824   ALKPHOS 117 02/13/2012 1029   BILITOT 0.34 10/21/2013 0824   BILITOT 0.3 02/13/2012 1029   GFRNONAA >90 04/04/2013 0740   GFRAA >90 04/04/2013 0740  No results found for this basename: SPEP,  UPEP,   kappa and lambda light chains    Lab Results  Component Value Date   WBC 10.8* 10/21/2013   NEUTROABS 7.6* 10/21/2013   HGB 10.8* 10/21/2013   HCT 32.8* 10/21/2013   MCV 68.0* 10/21/2013   PLT 476* 10/21/2013      Chemistry      Component Value Date/Time   NA 142 10/21/2013 0824   NA 140 04/04/2013 0740   K 3.7 10/21/2013 0824   K 4.3 04/04/2013 0740   CL 104 04/04/2013 0740   CL 104 11/15/2012 0823   CO2 24 10/21/2013 0824   CO2 27 04/04/2013 0740   BUN 9.4 10/21/2013 0824   BUN 12 04/04/2013 0740   CREATININE 0.8 10/21/2013 0824   CREATININE 0.80 04/04/2013 0740   GLU 166* 09/19/2010 1538      Component Value Date/Time    CALCIUM 9.6 10/21/2013 0824   CALCIUM 10.0 04/04/2013 0740   ALKPHOS 73 10/21/2013 0824   ALKPHOS 117 02/13/2012 1029   AST 15 10/21/2013 0824   AST 25 02/13/2012 1029   ALT 23 10/21/2013 0824   ALT 52 02/13/2012 1029   BILITOT 0.34 10/21/2013 0824   BILITOT 0.3 02/13/2012 1029     ASSESSMENT & PLAN:  #1 multiple myeloma The patient has been adequately treated since 2011. 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 His skeletal survey from today September show no evidence of new lytic lesions. The patient is asymptomatic. He had evidence of mild anemia but no evidence of hypercalcemia or renal failure. I recommend we continue to follow him every 4 months with history, physical examination, and blood work. typically I would order skeletal survey once a year unless he is symptomatic. #2 microcytic anemia The patient was on anticoagulation therapy due to his history of bilateral DVT. He had intermittent hematuria and has received iron infusion in the past.  He is still anemic and I recommend he starts taking one tablet of oral iron daily. Previously, he developed an infusion reaction to and I recommend the use iron sucrose in the future if needed. #3 neurogenic bladder The patient had recent replacement of a suprapubic catheter. Hopefully this will reduce his risk of recurrent infection. #4 mild leukocytosis It could be due to his corticosteroid therapy that he takes for history of adrenal insufficiency, all related to recent surgery. There are no signs or symptoms such as suggest active infection. We will observe for now.  #5 history of DVT The patient had an IVC filter placed 2 years ago. He is currently on Xarelto. Monitor for bleeding carefully.  Orders Placed This Encounter  Procedures  . CBC with Differential    Standing Status: Future     Number of Occurrences:      Standing Expiration Date: 10/21/2014  . Comprehensive metabolic  panel    Standing Status: Future     Number of Occurrences:      Standing Expiration Date: 10/21/2014  . Kappa/lambda light chains    Standing Status: Future     Number of Occurrences:      Standing Expiration Date: 10/21/2014  . SPEP & IFE with QIG    Standing Status: Future     Number of Occurrences:      Standing Expiration Date: 10/21/2014  . Iron and TIBC    Standing Status: Future     Number of Occurrences:  Standing Expiration Date: 10/21/2014  . Ferritin    Standing Status: Future     Number of Occurrences:      Standing Expiration Date: 10/21/2014   All questions were answered. The patient knows to call the clinic with any problems, questions or concerns. No barriers to learning was detected. I spent 25 minutes counseling the patient face to face. The total time spent in the appointment was 30 minutes and more than 50% was on counseling and review of test results     [facility], [place], MD 10/21/2013 9:51 PM

## 2013-10-22 ENCOUNTER — Telehealth: Payer: Self-pay | Admitting: Hematology and Oncology

## 2013-10-22 NOTE — Telephone Encounter (Signed)
, °

## 2013-10-24 LAB — SPEP & IFE WITH QIG
Albumin ELP: 46.5 % — ABNORMAL LOW (ref 55.8–66.1)
Alpha-1-Globulin: 8.9 % — ABNORMAL HIGH (ref 2.9–4.9)
Alpha-2-Globulin: 14.9 % — ABNORMAL HIGH (ref 7.1–11.8)
Beta 2: 6 % (ref 3.2–6.5)
Beta Globulin: 7.1 % (ref 4.7–7.2)
Gamma Globulin: 16.6 % (ref 11.1–18.8)
IgA: 155 mg/dL (ref 68–379)
IgG (Immunoglobin G), Serum: 1220 mg/dL (ref 650–1600)
IgM, Serum: 65 mg/dL (ref 41–251)
Total Protein, Serum Electrophoresis: 6.5 g/dL (ref 6.0–8.3)

## 2013-10-24 LAB — KAPPA/LAMBDA LIGHT CHAINS
Kappa free light chain: 1.9 mg/dL (ref 0.33–1.94)
Kappa:Lambda Ratio: 1.48 (ref 0.26–1.65)
Lambda Free Lght Chn: 1.28 mg/dL (ref 0.57–2.63)

## 2013-10-24 LAB — SEDIMENTATION RATE: Sed Rate: 73 mm/hr — ABNORMAL HIGH (ref 0–16)

## 2013-10-24 LAB — BETA 2 MICROGLOBULIN, SERUM: Beta-2 Microglobulin: 2.6 mg/L — ABNORMAL HIGH (ref <=2.51)

## 2013-12-09 ENCOUNTER — Emergency Department (HOSPITAL_COMMUNITY)
Admission: EM | Admit: 2013-12-09 | Discharge: 2013-12-10 | Disposition: A | Discharge status: Home / Self Care | Payer: Medicare (Managed Care) | Attending: Emergency Medicine | Admitting: Emergency Medicine

## 2013-12-09 ENCOUNTER — Encounter (HOSPITAL_COMMUNITY): Payer: Self-pay | Admitting: Emergency Medicine

## 2013-12-09 DIAGNOSIS — Z856 Personal history of leukemia: Secondary | ICD-10-CM | POA: Diagnosis not present

## 2013-12-09 DIAGNOSIS — IMO0002 Reserved for concepts with insufficient information to code with codable children: Secondary | ICD-10-CM | POA: Insufficient documentation

## 2013-12-09 DIAGNOSIS — Z87448 Personal history of other diseases of urinary system: Secondary | ICD-10-CM | POA: Insufficient documentation

## 2013-12-09 DIAGNOSIS — K9413 Enterostomy malfunction: Principal | ICD-10-CM

## 2013-12-09 DIAGNOSIS — Z79899 Other long term (current) drug therapy: Secondary | ICD-10-CM | POA: Diagnosis not present

## 2013-12-09 DIAGNOSIS — E119 Type 2 diabetes mellitus without complications: Secondary | ICD-10-CM | POA: Diagnosis not present

## 2013-12-09 DIAGNOSIS — E2749 Other adrenocortical insufficiency: Secondary | ICD-10-CM | POA: Diagnosis not present

## 2013-12-09 DIAGNOSIS — D689 Coagulation defect, unspecified: Secondary | ICD-10-CM | POA: Insufficient documentation

## 2013-12-09 DIAGNOSIS — T83010A Breakdown (mechanical) of cystostomy catheter, initial encounter: Secondary | ICD-10-CM

## 2013-12-09 DIAGNOSIS — Z7901 Long term (current) use of anticoagulants: Secondary | ICD-10-CM | POA: Insufficient documentation

## 2013-12-09 DIAGNOSIS — Z862 Personal history of diseases of the blood and blood-forming organs and certain disorders involving the immune mechanism: Secondary | ICD-10-CM | POA: Diagnosis not present

## 2013-12-09 DIAGNOSIS — G8220 Paraplegia, unspecified: Secondary | ICD-10-CM | POA: Insufficient documentation

## 2013-12-09 DIAGNOSIS — K9403 Colostomy malfunction: Secondary | ICD-10-CM | POA: Insufficient documentation

## 2013-12-09 DIAGNOSIS — Z86718 Personal history of other venous thrombosis and embolism: Secondary | ICD-10-CM | POA: Insufficient documentation

## 2013-12-09 NOTE — ED Notes (Signed)
Bed: DE08 Expected date:  Expected time:  Means of arrival:  Comments: ems- suprapubic cath out

## 2013-12-09 NOTE — ED Notes (Signed)
Per Dr. Mingo Amber, [place] to place foley catheter after speaking with patient's doctor

## 2013-12-09 NOTE — ED Notes (Signed)
Dr. Mingo Amber at bedside

## 2013-12-09 NOTE — ED Provider Notes (Signed)
CSN: [id number]     Arrival date & time 12/09/13  2118 History   First MD Initiated Contact with Patient 12/09/13 2124     Chief Complaint  Patient presents with  . Suprapubic Catheter Issue      (Consider location/radiation/quality/duration/timing/severity/associated sxs/prior Treatment) HPI Comments: Suprapubic catheter popped out while at home spontaneously. No pain. Happened about 2.5 hours ago.  Patient is a 58 y.o. male presenting with male genitourinary complaint. The history is provided by the patient.  Male GU Problem Presenting symptoms: no penile discharge, no penile pain and no scrotal pain   Presenting symptoms comment:  Suprapubic catheter dislodged Context: spontaneously   Relieved by:  Nothing Worsened by:  Nothing tried Associated symptoms: no abdominal pain, no fever, no nausea and no vomiting   Risk factors: urinary catheter   Risk factors: no recent infection     Past Medical History  Diagnosis Date  . Multiple myeloma     thoracic T8 with paraplegia s/p resection- on chemo at visit 10/13/10  . Neurogenic bladder   . Neurogenic bowel   . Partial small bowel obstruction during dec 2011 admission  . Multiple myeloma   . Multiple myeloma without mention of remission   . Cancer   . History of blood transfusion 7/14  . Paraplegia   . Gross hematuria 7/14    post foley cath procedure  . Coagulopathy     on xeralto/ s/p DVT while on coumadin,  IVC in place  . Adrenal insufficiency     on chronic dexamethasone  . Diabetes mellitus without complication    Past Surgical History  Procedure Laterality Date  . Myeloma thoracic t8 with parpaplegia s/p thoracotomy and thoracic t7-9 cage placement on dec 26th 2011  07/18/10  . Laparotomy  07/20/2011    Procedure: EXPLORATORY LAPAROTOMY;  Surgeon: Judieth Keens, DO;  Location: [facility];  Service: General;  Laterality: N/A;  . Colostomy revision  07/20/2011    Procedure: COLON RESECTION SIGMOID;  Surgeon: Judieth Keens, DO;  Location: [facility];  Service: General;;  . Colostomy  07/20/2011    Procedure: COLOSTOMY;  Surgeon: Judieth Keens, DO;  Location: [facility];  Service: General;;  . Insertion of suprapubic catheter N/A 04/04/2013    Procedure: INSERTION OF SUPRAPUBIC CATHETER;  Surgeon: Alexis Frock, MD;  Location: [facility];  Service: Urology;  Laterality: N/A;  . Cystoscopy N/A 04/04/2013    Procedure: CYSTOSCOPY WITH LITHALOPAXY;  Surgeon: Alexis Frock, MD;  Location: [facility];  Service: Urology;  Laterality: N/A;   History reviewed. No pertinent family history. History  Substance Use Topics  . Smoking status: Never Smoker   . Smokeless tobacco: Never Used  . Alcohol Use: No    Review of Systems  Constitutional: Negative for fever.  Respiratory: Negative for cough and shortness of breath.   Gastrointestinal: Negative for nausea, vomiting and abdominal pain.  Genitourinary: Negative for discharge and penile pain.  All other systems reviewed and are negative.     Allergies  Review of patient's allergies indicates no known allergies.  Home Medications   Prior to Admission medications   Medication Sig Start Date End Date Taking? Authorizing Provider  acetaminophen (TYLENOL) 325 MG tablet Take 325-650 mg by mouth every 6 (six) hours as needed. For pain.    Historical Provider, MD  cholecalciferol (VITAMIN D) 1000 UNITS tablet Take 1,000 Units by mouth daily.    Historical Provider, MD  dexamethasone (DECADRON) 0.75 MG tablet Take 0.75  mg by mouth daily. One tablet on Mon, Wed and Fridays.  One and half tablet rest of the week.    Historical Provider, MD  latanoprost (XALATAN) 0.005 % ophthalmic solution Place 1 drop into both eyes at bedtime.    Historical Provider, MD  metFORMIN (GLUCOPHAGE) 500 MG tablet Take 500 mg by mouth 2 (two) times daily with a meal.    Historical Provider, MD  methocarbamol (ROBAXIN) 500 MG tablet Take 500 mg by mouth every 6 (six) hours as needed. For  muscle spasms. 10/03/10   Historical Provider, MD  Rivaroxaban (XARELTO) 20 MG TABS tablet Take 20 mg by mouth daily with supper.    Historical Provider, MD   BP 114/84  Pulse 98  Temp(Src) 98.7 F (37.1 C) (Oral)  Resp 20  SpO2 99% Physical Exam  Nursing note and vitals reviewed. Constitutional: He is oriented to person, place, and time. He appears well-developed and well-nourished. No distress.  HENT:  Head: Normocephalic and atraumatic.  Mouth/Throat: No oropharyngeal exudate.  Eyes: EOM are normal. Pupils are equal, round, and reactive to light.  Neck: Normal range of motion. Neck supple.  Cardiovascular: Normal rate and regular rhythm.  Exam reveals no friction rub.   No murmur heard. Pulmonary/Chest: Effort normal and breath sounds normal. No respiratory distress. He has no wheezes. He has no rales.  Abdominal: He exhibits no distension. There is no tenderness. There is no rebound.  Abdomen with L sided colostomy, c/d/i. Suprapubic site c/d/i, nontender.  Musculoskeletal: He exhibits no edema and no tenderness.  Spastic paraplegia of lower extremities  Neurological: He is alert and oriented to person, place, and time.  Skin: He is not diaphoretic.    ED Course  Procedures (including critical care time) Labs Review Labs Reviewed - No data to display  Imaging Review No results found.   EKG Interpretation None      MDM   Final diagnoses:  Suprapubic catheter dysfunction    19M with multiple myeloma, suprapubic catheter for over 1 year. Suprapubic catheter popped out tonight. No abdominal pain. Site looks well, will attempt reinsertion here in the ED. Unable to re-insert suprapubic catheter at the bedside. I spoke with [name]-Anne, the NP at [facility] and she agreed with Foley insertion and f/u for reinsertion of the suprapubic catheter.  Unable to pass Foley or Caude catheter. Dr. Louis Meckel with Urology consulted and will see patient.   Osvaldo Shipper,  MD 12/10/13 Shelah Lewandowsky

## 2013-12-09 NOTE — ED Notes (Signed)
EDP unable to place new suprapubic catheter--resistance met Per MD, attempt to place foley cath via penis to be made Entered room to place foley cath and patient states that his wife wants [facility] to be called and made aware Dr. Mingo Amber informed

## 2013-12-09 NOTE — ED Notes (Signed)
At 1950, suprapubic catheter came out tonight--tubing wasn't pulled, just "popped out." Patient denies c/o pain at site No swelling or bleeding noted to site

## 2013-12-10 MED ORDER — CIPROFLOXACIN IN D5W 400 MG/200ML IV SOLN
400.0000 mg | Freq: Once | INTRAVENOUS | Status: AC
  Administered 2013-12-10: 400 mg via INTRAVENOUS
  Filled 2013-12-10: qty 200

## 2013-12-10 NOTE — ED Notes (Signed)
Urology at bedside.

## 2013-12-10 NOTE — Procedures (Signed)
Pre-procedure diagnosis: Neurogenic bladder, managed by suprapubic tube Post-procedure diagnosis: as above  Procedure performed: Replacement of suprapubic tube  Surgeon: Dr. Ardis Hughs  Findings: Difficulty passing the catheter through the muscle/fascia and into the bladder. Ultimately, a 29 French silicone catheter was atraumatically placed into the patient's bladder with clear yellow urine returning. Specimen: None  Drains: 14 French silicone suprapubic catheter  Indications:  Patient unable to void on his own.  Nursing staff have been unsuccessful at placing catheter.  Procedure:  Suprapubic region was prepped with Betadine. The area was then lubricated and attempt to pass a 18 [place] council tip catheter under sterile conditions was unsuccessful. I was unable to get the tip of the catheter beyond the fascia. I then passed a 0.38 sensor wire through the suprapubic tract and advanced into the bladder. I then attempted to replace 83 [place] council tip catheter over the wire the bladder again running into resistance at the level of the fascia. I then passed a 5 [place] open-ended ureteral catheter over the wire in exchange for the council tip catheter and remove the wire. I aspirated clear yellow urine ensuring that my wire was positioned into the bladder. I then exchanged the wire for the 5 [place] open-ended catheter. I then attempted an 37 French silicone catheter over the wire which was also unsuccessful, unable to get past the patient's fascial layer. I then attempted a 18 [place] council tipped catheter again unsuccessfully. My next step was to place a 36 French silicone catheter over the wire. This was done with some difficulty, however once I was beyond the fascia the rest of the catheter was easily inserted. Clear yellow urine was returned. The patient tolerated this procedure without any difficulty.  Disposition: I have ordered Cipro 400 mg IV x1. I've spoken with [facility]  nurse practitioner recommended that they exchanged the catheter in one month.

## 2013-12-10 NOTE — ED Provider Notes (Signed)
Care assumed from Dr Mingo Amber.  Pt with suprapubic catheter that has become dislodged, awaiting urology replacement.  Dr Louis Meckel has replaced, and recommends cipro prior to d/c home.  Kalman Drape, MD 12/10/13 [phone]

## 2013-12-10 NOTE — Consult Note (Signed)
I have been asked to see the patient by Dr. Murlean Caller, for evaluation and management of replacement of suprapubic catheter.  History of present illness: 58 year old male presents to the emergency department after a suprapubic catheter fell out while being cleaned in the nursing facility. 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 His bladder was initially managed with a indwelling Foley catheter and was switched to a suprapubic catheter September 2014. This is been managed as an 10 [place] Foley catheter changed every 4 weeks. The patient presented to the emergency department in the emergency room staff were not able to get a catheter in. As such urologist consult. The patient denies any pain or bladder spasms. He denies any foul-smelling urine or gross hematuria. There no additional modifying factors are associated signs and symptoms.  Review of systems: A 12 point comprehensive review of systems was obtained and is negative unless otherwise stated in the history of present illness.  Patient Active Problem List   Diagnosis Date Noted  . Hematuria 02/12/2013  . Shock 02/12/2013  . Adrenal insufficiency 02/12/2013  . Hypoalbuminemia 11/15/2012  . Anemia, unspecified 09/18/2012  . Unspecified vitamin D deficiency 09/18/2012  . 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 Iron deficiency anemia, unspecified 09/18/2012  . Diverticulitis of large intestine with perforation 07/26/2011  . Multiple myeloma 10/14/2010  . Neurogenic bowel 10/14/2010  . SPINAL CORD COMPRESSION 07/13/2010  . Neurogenic bladder 07/11/2010  . BACK PAIN, LUMBAR, WITH RADICULOPATHY 07/07/2010  . DISEQUILIBRIUM 07/07/2010  . ABDOMINAL PAIN, GENERALIZED 07/07/2010  . OBESITY, NOS 09/20/2006    No current facility-administered medications on file prior to encounter.   Current Outpatient  Prescriptions on File Prior to Encounter  Medication Sig Dispense Refill  . acetaminophen (TYLENOL) 325 MG tablet Take 325-650 mg by mouth every 6 (six) hours as needed. For pain.      . cholecalciferol (VITAMIN D) 1000 UNITS tablet Take 1,000 Units by mouth daily.      Marland Kitchen dexamethasone (DECADRON) 0.75 MG tablet Take 0.75 mg by mouth daily. One tablet on Mon, Wed and Fridays.  One and half tablet rest of the week.      . latanoprost (XALATAN) 0.005 % ophthalmic solution Place 1 drop into both eyes at bedtime.      . metFORMIN (GLUCOPHAGE) 500 MG tablet Take 500 mg by mouth 2 (two) times daily with a meal.      . methocarbamol (ROBAXIN) 500 MG tablet Take 500 mg by mouth every 6 (six) hours as needed. For muscle spasms.      . Rivaroxaban (XARELTO) 20 MG TABS tablet Take 20 mg by mouth daily with supper.        Past Medical History  Diagnosis Date  . Multiple myeloma     thoracic T8 with paraplegia s/p resection- on chemo at visit 10/13/10  . Neurogenic bladder   . Neurogenic bowel   . Partial small bowel obstruction during dec 2011 admission  . Multiple myeloma   . Multiple myeloma without mention of remission   . Cancer   . History of blood transfusion 7/14  . Paraplegia   . Gross hematuria 7/14    post foley cath procedure  . Coagulopathy     on xeralto/ s/p DVT while on coumadin,  IVC in place  . Adrenal insufficiency     on chronic dexamethasone  . Diabetes mellitus without  complication     Past Surgical History  Procedure Laterality Date  . Myeloma thoracic t8 with parpaplegia s/p thoracotomy and thoracic t7-9 cage placement on dec 26th 2011  07/18/10  . Laparotomy  07/20/2011    Procedure: EXPLORATORY LAPAROTOMY;  Surgeon: Judieth Keens, DO;  Location: [facility];  Service: General;  Laterality: N/A;  . Colostomy revision  07/20/2011    Procedure: COLON RESECTION SIGMOID;  Surgeon: Judieth Keens, DO;  Location: [facility];  Service: General;;  . Colostomy  07/20/2011     Procedure: COLOSTOMY;  Surgeon: Judieth Keens, DO;  Location: [facility];  Service: General;;  . Insertion of suprapubic catheter N/A 04/04/2013    Procedure: INSERTION OF SUPRAPUBIC CATHETER;  Surgeon: Alexis Frock, MD;  Location: [facility];  Service: Urology;  Laterality: N/A;  . Cystoscopy N/A 04/04/2013    Procedure: CYSTOSCOPY WITH LITHALOPAXY;  Surgeon: Alexis Frock, MD;  Location: [facility];  Service: Urology;  Laterality: N/A;    History  Substance Use Topics  . Smoking status: Never Smoker   . Smokeless tobacco: Never Used  . Alcohol Use: No    History reviewed. No pertinent family history.  PE: Filed Vitals:   12/09/13 2123 12/09/13 2126  BP:  114/84  Pulse:  98  Temp:  98.7 F (37.1 C)  TempSrc:  Oral  Resp:  20  SpO2: 100% 99%   Patient appears to be in no acute distress  patient is alert and oriented x3 Atraumatic normocephalic head No cervical or supraclavicular lymphadenopathy appreciated No increased work of breathing, no audible wheezes/rhonchi Regular sinus rhythm/rate Abdomen is soft, nontender, nondistended, no CVA or suprapubic tenderness  Well established suprapubic tract in appearance from the skin without fluctuance or erythema. The os is patent.  Lower extremities are symmetric without appreciable edema Lower extremity paralysis with intermittent spastic movements No identifiable skin lesions  No results found for this basename: WBC, HGB, HCT,  in the last 72 hours No results found for this basename: NA, K, CL, CO2, GLUCOSE, BUN, CREATININE, CALCIUM,  in the last 72 hours No results found for this basename: LABPT, INR,  in the last 72 hours No results found for this basename: LABURIN,  in the last 72 hours Results for orders placed during the hospital encounter of 02/12/13  CULTURE, BLOOD (ROUTINE X 2)     Status: None   Collection Time    02/12/13  5:45 PM      Result Value Ref Range Status   Specimen Description BLOOD RIGHT HAND   Final    Special Requests BOTTLES DRAWN AEROBIC ONLY 2CC   Final   Culture  Setup Time 02/12/2013 22:33   Final   Culture NO GROWTH 5 DAYS   Final   Report Status 02/18/2013 FINAL   Final  MRSA PCR SCREENING     Status: None   Collection Time    02/12/13  5:46 PM      Result Value Ref Range Status   MRSA by PCR NEGATIVE  NEGATIVE Final   Comment:            The GeneXpert MRSA Assay (FDA     approved for NASAL specimens     only), is one component of a     comprehensive MRSA colonization     surveillance program. It is not     intended to diagnose MRSA     infection nor to guide or     monitor treatment for  MRSA infections.  CULTURE, BLOOD (ROUTINE X 2)     Status: None   Collection Time    02/12/13  5:52 PM      Result Value Ref Range Status   Specimen Description BLOOD LEFT HAND   Final   Special Requests BOTTLES DRAWN AEROBIC ONLY 2CC   Final   Culture  Setup Time 02/12/2013 22:33   Final   Culture NO GROWTH 5 DAYS   Final   Report Status 02/18/2013 FINAL   Final  URINE CULTURE     Status: None   Collection Time    02/12/13  6:00 PM      Result Value Ref Range Status   Specimen Description URINE, RANDOM   Final   Special Requests NONE   Final   Culture  Setup Time 02/13/2013 00:20   Final   Colony Count NO GROWTH   Final   Culture NO GROWTH   Final   Report Status 02/13/2013 FINAL   Final   Procedure: Please see separate dictation/procedure note. A 14 French silicon catheter was ultimately placed in the patient's suprapubic tract and into the bladder.   Imaging: none  Imp:  neurogenic bladder managed with suprapubic tube which had fallen out and has been replaced.   Recommendations: would recommend that the patient be given a dose of 400 mg of IV Cipro x1 for prophylaxis following the procedure. The Foley catheter should be changed in 1 month.  If the suprapubic tube comes clogged with sediment, they could attempt at passing a larger catheter that time.  I have spoken  with [facility] nurse practitioner to convey the instructions. I recommended followup with urology on a when necessary basis.    Ardis Hughs

## 2014-05-07 ENCOUNTER — Telehealth: Payer: Self-pay | Admitting: Hematology and Oncology

## 2014-05-07 NOTE — Telephone Encounter (Signed)
Faxed pt medical records to [facility]

## 2014-05-11 ENCOUNTER — Other Ambulatory Visit: Payer: Self-pay | Admitting: Family Medicine

## 2014-05-11 DIAGNOSIS — C9000 Multiple myeloma not having achieved remission: Secondary | ICD-10-CM

## 2014-05-13 ENCOUNTER — Other Ambulatory Visit: Payer: Medicare (Managed Care)

## 2014-05-13 ENCOUNTER — Ambulatory Visit (HOSPITAL_COMMUNITY)
Admission: RE | Admit: 2014-05-13 | Discharge: 2014-05-13 | Disposition: A | Discharge status: Home / Self Care | Payer: Medicare (Managed Care) | Source: Ambulatory Visit | Attending: Family Medicine | Admitting: Family Medicine

## 2014-05-13 DIAGNOSIS — C9000 Multiple myeloma not having achieved remission: Secondary | ICD-10-CM

## 2014-05-19 ENCOUNTER — Telehealth: Payer: Self-pay | Admitting: *Deleted

## 2014-05-19 ENCOUNTER — Telehealth: Payer: Self-pay | Admitting: Hematology and Oncology

## 2014-05-19 NOTE — Telephone Encounter (Signed)
Spoke w/ wife and informed of need for lab and f/u visit one week after lab.  She verbalized understanding and requests we call [facility] to schedule his appts..   Request sent to scheduling.

## 2014-05-19 NOTE — Telephone Encounter (Signed)
Message copied by Cathlean Cower on Tue May 19, 2014  8:34 AM ------      Message from: [facility], [place]      Created: Mon May 18, 2014  3:29 PM      Regarding: Lost to follow-up       I reviewed my last notes and he is supposed to see me in 4 months. Can we get him back for lab draws now and see me next week?      ----- Message -----         From: Patton Salles, RN         Sent: 05/15/2014  11:13 AM           To: [name], MD            Dr Barney Drain- [facility] [phone].      Please call regarding [name] new lesion in pelvis.       ------

## 2014-05-19 NOTE — Telephone Encounter (Signed)
s.w. [facility] [phone] and advised on NOV appt....ok and aware

## 2014-05-20 NOTE — Telephone Encounter (Signed)
Spoke w/ NP at [facility] yesterday.  She is aware of appts made. She says they did labwork on pt and will fax over results. She thinks it is all the same lab work ordered by Dr. Alvy Bimler.  Informed her will give labs to Dr. Alvy Bimler for review when we get them and if she wants any more we will call them back.

## 2014-05-26 ENCOUNTER — Encounter: Payer: Self-pay | Admitting: Hematology and Oncology

## 2014-05-26 ENCOUNTER — Other Ambulatory Visit: Payer: Medicare (Managed Care)

## 2014-05-26 ENCOUNTER — Telehealth: Payer: Self-pay | Admitting: Hematology and Oncology

## 2014-05-26 ENCOUNTER — Ambulatory Visit (HOSPITAL_BASED_OUTPATIENT_CLINIC_OR_DEPARTMENT_OTHER): Payer: Medicare (Managed Care) | Admitting: Hematology and Oncology

## 2014-05-26 VITALS — BP 126/74 | HR 80 | Temp 97.9°F | Resp 18

## 2014-05-26 DIAGNOSIS — R319 Hematuria, unspecified: Secondary | ICD-10-CM

## 2014-05-26 DIAGNOSIS — D509 Iron deficiency anemia, unspecified: Secondary | ICD-10-CM

## 2014-05-26 DIAGNOSIS — Z8579 Personal history of other malignant neoplasms of lymphoid, hematopoietic and related tissues: Secondary | ICD-10-CM

## 2014-05-26 DIAGNOSIS — C9000 Multiple myeloma not having achieved remission: Secondary | ICD-10-CM

## 2014-05-26 NOTE — Assessment & Plan Note (Signed)
Clinically, he has no signs of disease progression. Skeletal survey showed small lytic lesion and his blood M spike is barely detectable. I discussed the natural history of multiple myeloma with him and his wife. At present time he has no evidence of organ damage. I recommend repeat blood work, history and physical examination in 6 months. The patient is educated to watch out for signs and symptoms of disease recurrence.

## 2014-05-26 NOTE — Progress Notes (Signed)
[facility] OFFICE PROGRESS NOTE  Patient Care Team: [name], MD as PCP - General (Family Medicine) [name], MD as Consulting Physician (Hematology and Oncology) [name], MD as Consulting Physician (Physical Medicine and Rehabilitation) [name], PA-C as Physician Assistant (Physical Medicine and Rehabilitation) [name], MD as Consulting Physician (Hematology and Oncology)  SUMMARY OF ONCOLOGIC HISTORY: 1. 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 The patient underwent biopsy and cord decompression by Dr. Dierdre Harness on 07/15/2010. He underwent radiation treatments which concluded by 08/22/2010. The patient received treatment with Revlimid, Decadron, and Velcade in combination with Zometa. His initial IgG levels were as high as 5570 back in February 2012. Initial 24-hour urine protein came to 318 mg, positive for IgG heavy chains and lambda light chains. I do not think the patient ever had a bone marrow or a metastatic bone survey; however, CT scans of abdomen and pelvis carried out on 12/09/2010 have shown myelomatous involvement of the bones. A CT scan of the head on 05/05/2011 was normal. The patient has had an  outstanding response to treatment. IgG level from 09/11/2012 was 904. Serum immunofixation electrophoresis continues to show monoclonal IgG lambda protein. It appears that Velcade was discontinued in September 2012 and that Revlimid and Decadron were discontinued in March 2013. 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 The patient is presently on no specific treatment for his IgG lambda multiple myeloma.  2. Paraplegia which developed in December 2011. The patient is status post surgical decompression of the T8 paraspinal mass by  Dr. Erline Levine. He is also status post radiation treatments. The patient had chronic neurogenic bladder and bowel. He has placement of suprapubic catheter. 3. 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 He had an inferior vena cava filter had been placed on November 10, 2010 and he has been on Xarelto since 06/12/2012.  4. 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  5. He also has adrenal insufficiency on chronic prednisone therapy complicated by diabetes mellitus.  6.He has chronic anemia noted since 2014 treated with IV Feraheme and had infusion reactions.  7. 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 Isac Caddy.  INTERVAL HISTORY: Please see below for problem oriented charting. He has mild chronic hematuria. Denies recent bone pain or bone fracture. No recent infection.  REVIEW OF SYSTEMS:   Constitutional: Denies fevers, chills or abnormal weight loss Eyes: Denies blurriness of vision Ears, nose, mouth, throat, and face: Denies mucositis or sore throat Respiratory: Denies cough, dyspnea or wheezes Cardiovascular: Denies palpitation, chest discomfort or lower extremity swelling Gastrointestinal:  Denies nausea, heartburn or change in bowel habits Skin: Denies abnormal skin rashes Lymphatics: Denies new lymphadenopathy or easy bruising Neurological:Denies numbness, tingling or new weaknesses Behavioral/Psych: Mood is stable, no new changes  All other systems were reviewed with the patient and are negative.  I have reviewed the past medical history, past surgical history, social history and family history with the patient and they are unchanged from previous note.  ALLERGIES:  has No Known Allergies.  MEDICATIONS:  Current Outpatient Prescriptions  Medication Sig Dispense  Refill  .  acetaminophen (TYLENOL) 325 MG tablet Take 325-650 mg by mouth every 6 (six) hours as needed. For pain.    . cholecalciferol (VITAMIN D) 1000 UNITS tablet Take 1,000 Units by mouth daily.    Marland Kitchen dexamethasone (DECADRON) 1 MG tablet Take 1 mg by mouth daily with breakfast.    . latanoprost (XALATAN) 0.005 % ophthalmic solution Place 1 drop into both eyes at bedtime.    . metFORMIN (GLUCOPHAGE) 500 MG tablet Take 500 mg by mouth 2 (two) times daily with a meal.    . methocarbamol (ROBAXIN) 500 MG tablet Take 500 mg by mouth every 6 (six) hours as needed. For muscle spasms.    . Rivaroxaban (XARELTO) 20 MG TABS tablet Take 20 mg by mouth daily with supper.     No current facility-administered medications for this visit.    PHYSICAL EXAMINATION: ECOG PERFORMANCE STATUS: 2 - Symptomatic, <50% confined to bed  Filed Vitals:   05/26/14 1115  BP: 126/74  Pulse: 80  Temp: 97.9 F (36.6 C)  Resp: 18   There were no vitals filed for this visit.  GENERAL:alert, no distress and comfortable. He is sitting on the wheelchair SKIN: skin color, texture, turgor are normal, no rashes or significant lesions EYES: normal, Conjunctiva are pink and non-injected, sclera clear OROPHARYNX:no exudate, no erythema and lips, buccal mucosa, and tongue normal  NECK: supple, thyroid normal size, non-tender, without nodularity LYMPH:  no palpable lymphadenopathy in the cervical, axillary or inguinal LUNGS: clear to auscultation and percussion with normal breathing effort HEART: regular rate & rhythm and no murmurs and no lower extremity edema ABDOMEN:abdomen soft, non-tender and normal bowel sounds Musculoskeletal:no cyanosis of digits and no clubbing  NEURO: alert & oriented x 3 with fluent speech  LABORATORY DATA:  I have reviewed the data as listed    Component Value Date/Time   NA 142 10/21/2013 0824   NA 140 04/04/2013 0740   K 3.7 10/21/2013 0824   K 4.3 04/04/2013 0740   CL 104 04/04/2013  0740   CL 104 11/15/2012 0823   CO2 24 10/21/2013 0824   CO2 27 04/04/2013 0740   GLUCOSE 118 10/21/2013 0824   GLUCOSE 94 04/04/2013 0740   GLUCOSE 141* 11/15/2012 0823   BUN 9.4 10/21/2013 0824   BUN 12 04/04/2013 0740   CREATININE 0.8 10/21/2013 0824   CREATININE 0.80 04/04/2013 0740   CALCIUM 9.6 10/21/2013 0824   CALCIUM 10.0 04/04/2013 0740   PROT 7.3 10/21/2013 0824   PROT 7.0 02/13/2012 1029   ALBUMIN 3.0* 10/21/2013 0824   ALBUMIN 3.8 02/13/2012 1029   AST 15 10/21/2013 0824   AST 25 02/13/2012 1029   ALT 23 10/21/2013 0824   ALT 52 02/13/2012 1029   ALKPHOS 73 10/21/2013 0824   ALKPHOS 117 02/13/2012 1029   BILITOT 0.34 10/21/2013 0824   BILITOT 0.3 02/13/2012 1029   GFRNONAA >90 04/04/2013 0740   GFRAA >90 04/04/2013 0740    No results found for: SPEP, UPEP  Lab Results  Component Value Date   WBC 10.8* 10/21/2013   NEUTROABS 7.6* 10/21/2013   HGB 10.8* 10/21/2013   HCT 32.8* 10/21/2013   MCV 68.0* 10/21/2013   PLT 476* 10/21/2013      Chemistry      Component Value Date/Time   NA 142 10/21/2013 0824   NA 140 04/04/2013 0740   K 3.7 10/21/2013 0824   K 4.3 04/04/2013 0740   CL 104 04/04/2013 0740   CL 104 11/15/2012 4492  CO2 24 10/21/2013 0824   CO2 27 04/04/2013 0740   BUN 9.4 10/21/2013 0824   BUN 12 04/04/2013 0740   CREATININE 0.8 10/21/2013 0824   CREATININE 0.80 04/04/2013 0740   GLU 166* 09/19/2010 1538      Component Value Date/Time   CALCIUM 9.6 10/21/2013 0824   CALCIUM 10.0 04/04/2013 0740   ALKPHOS 73 10/21/2013 0824   ALKPHOS 117 02/13/2012 1029   AST 15 10/21/2013 0824   AST 25 02/13/2012 1029   ALT 23 10/21/2013 0824   ALT 52 02/13/2012 1029   BILITOT 0.34 10/21/2013 0824   BILITOT 0.3 02/13/2012 1029     Please see scanned blood test records. I have reviewed his most recent skeletal survey report. ASSESSMENT & PLAN:  Multiple myeloma Clinically, he has no signs of disease progression. Skeletal survey showed small  lytic lesion and his blood M spike is barely detectable. I discussed the natural history of multiple myeloma with him and his wife. At present time he has no evidence of organ damage. I recommend repeat blood work, history and physical examination in 6 months. The patient is educated to watch out for signs and symptoms of disease recurrence.  Iron deficiency anemia He has chronic mild hematuria and is on chronic oral iron replacement therapy. He is doing well. He will continue taking oral iron supplement indefinitely.   No orders of the defined types were placed in this encounter.   All questions were answered. The patient knows to call the clinic with any problems, questions or concerns. No barriers to learning was detected. I spent 25 minutes counseling the patient face to face. The total time spent in the appointment was 30 minutes and more than 50% was on counseling and review of test results     [facility], [place], MD 05/26/2014 1:19 PM

## 2014-05-26 NOTE — Telephone Encounter (Signed)
Gave avs & Cal for May 2016.

## 2014-05-26 NOTE — Assessment & Plan Note (Signed)
He has chronic mild hematuria and is on chronic oral iron replacement therapy. He is doing well. He will continue taking oral iron supplement indefinitely.

## 2014-07-20 ENCOUNTER — Other Ambulatory Visit: Payer: Self-pay | Admitting: Family Medicine

## 2014-07-20 ENCOUNTER — Inpatient Hospital Stay (HOSPITAL_COMMUNITY)
Admission: AD | Admit: 2014-07-20 | Discharge: 2014-07-24 | DRG: 699 | Disposition: A | Discharge status: Home / Self Care | Payer: Medicare (Managed Care) | Source: Ambulatory Visit | Attending: Internal Medicine | Admitting: Internal Medicine

## 2014-07-20 ENCOUNTER — Other Ambulatory Visit (HOSPITAL_COMMUNITY): Payer: Self-pay | Admitting: Family Medicine

## 2014-07-20 ENCOUNTER — Encounter (HOSPITAL_COMMUNITY): Payer: Self-pay | Admitting: *Deleted

## 2014-07-20 DIAGNOSIS — T83098A Other mechanical complication of other indwelling urethral catheter, initial encounter: Secondary | ICD-10-CM | POA: Diagnosis present

## 2014-07-20 DIAGNOSIS — R112 Nausea with vomiting, unspecified: Secondary | ICD-10-CM | POA: Diagnosis present

## 2014-07-20 DIAGNOSIS — Z86718 Personal history of other venous thrombosis and embolism: Secondary | ICD-10-CM

## 2014-07-20 DIAGNOSIS — Z7952 Long term (current) use of systemic steroids: Secondary | ICD-10-CM | POA: Diagnosis not present

## 2014-07-20 DIAGNOSIS — R109 Unspecified abdominal pain: Secondary | ICD-10-CM

## 2014-07-20 DIAGNOSIS — N179 Acute kidney failure, unspecified: Secondary | ICD-10-CM | POA: Diagnosis present

## 2014-07-20 DIAGNOSIS — N1339 Other hydronephrosis: Secondary | ICD-10-CM | POA: Diagnosis present

## 2014-07-20 DIAGNOSIS — Z933 Colostomy status: Secondary | ICD-10-CM | POA: Diagnosis not present

## 2014-07-20 DIAGNOSIS — R1084 Generalized abdominal pain: Secondary | ICD-10-CM

## 2014-07-20 DIAGNOSIS — C9000 Multiple myeloma not having achieved remission: Secondary | ICD-10-CM | POA: Diagnosis present

## 2014-07-20 DIAGNOSIS — N3289 Other specified disorders of bladder: Secondary | ICD-10-CM | POA: Diagnosis present

## 2014-07-20 DIAGNOSIS — Z7901 Long term (current) use of anticoagulants: Secondary | ICD-10-CM

## 2014-07-20 DIAGNOSIS — E2740 Unspecified adrenocortical insufficiency: Secondary | ICD-10-CM | POA: Diagnosis present

## 2014-07-20 DIAGNOSIS — C9001 Multiple myeloma in remission: Secondary | ICD-10-CM | POA: Diagnosis present

## 2014-07-20 DIAGNOSIS — T83090A Other mechanical complication of cystostomy catheter, initial encounter: Secondary | ICD-10-CM

## 2014-07-20 DIAGNOSIS — G8220 Paraplegia, unspecified: Secondary | ICD-10-CM | POA: Diagnosis present

## 2014-07-20 DIAGNOSIS — N1330 Unspecified hydronephrosis: Secondary | ICD-10-CM | POA: Diagnosis present

## 2014-07-20 DIAGNOSIS — T8351XA Infection and inflammatory reaction due to indwelling urinary catheter, initial encounter: Secondary | ICD-10-CM | POA: Diagnosis present

## 2014-07-20 DIAGNOSIS — K592 Neurogenic bowel, not elsewhere classified: Secondary | ICD-10-CM | POA: Diagnosis present

## 2014-07-20 DIAGNOSIS — E119 Type 2 diabetes mellitus without complications: Secondary | ICD-10-CM | POA: Diagnosis present

## 2014-07-20 DIAGNOSIS — C9002 Multiple myeloma in relapse: Secondary | ICD-10-CM | POA: Diagnosis present

## 2014-07-20 DIAGNOSIS — N319 Neuromuscular dysfunction of bladder, unspecified: Secondary | ICD-10-CM | POA: Diagnosis present

## 2014-07-20 HISTORY — DX: Anemia, unspecified: D64.9

## 2014-07-20 LAB — COMPREHENSIVE METABOLIC PANEL
ALT: 18 U/L (ref 0–53)
AST: 16 U/L (ref 0–37)
Albumin: 3.2 g/dL — ABNORMAL LOW (ref 3.5–5.2)
Alkaline Phosphatase: 66 U/L (ref 39–117)
Anion gap: 15 (ref 5–15)
BUN: 35 mg/dL — ABNORMAL HIGH (ref 6–23)
CO2: 20 mmol/L (ref 19–32)
Calcium: 8.7 mg/dL (ref 8.4–10.5)
Chloride: 99 mEq/L (ref 96–112)
Creatinine, Ser: 2.77 mg/dL — ABNORMAL HIGH (ref 0.50–1.35)
GFR calc Af Amer: 27 mL/min — ABNORMAL LOW (ref >90)
GFR calc non Af Amer: 23 mL/min — ABNORMAL LOW (ref >90)
Glucose, Bld: 110 mg/dL — ABNORMAL HIGH (ref 70–99)
Potassium: 4.4 mmol/L (ref 3.5–5.1)
Sodium: 134 mmol/L — ABNORMAL LOW (ref 135–145)
Total Bilirubin: 1 mg/dL (ref 0.3–1.2)
Total Protein: 7.7 g/dL (ref 6.0–8.3)

## 2014-07-20 LAB — LACTIC ACID, PLASMA: Lactic Acid, Venous: 1.3 mmol/L (ref 0.5–2.2)

## 2014-07-20 LAB — CBC
HCT: 35.4 % — ABNORMAL LOW (ref 39.0–52.0)
Hemoglobin: 12.2 g/dL — ABNORMAL LOW (ref 13.0–17.0)
MCH: 23.1 pg — ABNORMAL LOW (ref 26.0–34.0)
MCHC: 34.5 g/dL (ref 30.0–36.0)
MCV: 66.9 fL — ABNORMAL LOW (ref 78.0–100.0)
Platelets: 459 10*3/uL — ABNORMAL HIGH (ref 150–400)
RBC: 5.29 MIL/uL (ref 4.22–5.81)
RDW: 17.4 % — ABNORMAL HIGH (ref 11.5–15.5)
WBC: 20.3 10*3/uL — ABNORMAL HIGH (ref 4.0–10.5)

## 2014-07-20 LAB — GLUCOSE, CAPILLARY: Glucose-Capillary: 104 mg/dL — ABNORMAL HIGH (ref 70–99)

## 2014-07-20 LAB — LIPASE, BLOOD: Lipase: 25 U/L (ref 11–59)

## 2014-07-20 MED ORDER — RIVAROXABAN 20 MG PO TABS
20.0000 mg | ORAL_TABLET | Freq: Every day | ORAL | Status: DC
  Administered 2014-07-20: 20 mg via ORAL
  Filled 2014-07-20 (×2): qty 1

## 2014-07-20 MED ORDER — SODIUM CHLORIDE 0.9 % IV BOLUS (SEPSIS)
1000.0000 mL | Freq: Once | INTRAVENOUS | Status: AC
  Administered 2014-07-20: 1000 mL via INTRAVENOUS

## 2014-07-20 MED ORDER — SODIUM CHLORIDE 0.9 % IV SOLN
INTRAVENOUS | Status: DC
  Administered 2014-07-20: 20:00:00 via INTRAVENOUS

## 2014-07-20 MED ORDER — METHOCARBAMOL 500 MG PO TABS: 500.0000 mg | ORAL_TABLET | Freq: Three times a day (TID) | ORAL | Status: DC | PRN

## 2014-07-20 MED ORDER — ENSURE COMPLETE PO LIQD
237.0000 mL | Freq: Two times a day (BID) | ORAL | Status: DC
  Administered 2014-07-21 – 2014-07-22 (×3): 237 mL via ORAL

## 2014-07-20 MED ORDER — ONDANSETRON HCL 4 MG PO TABS: 4.0000 mg | ORAL_TABLET | Freq: Four times a day (QID) | ORAL | Status: DC | PRN

## 2014-07-20 MED ORDER — DEXAMETHASONE 0.5 MG PO TABS
0.5000 mg | ORAL_TABLET | Freq: Every day | ORAL | Status: DC
  Administered 2014-07-20 – 2014-07-24 (×5): 0.5 mg via ORAL
  Filled 2014-07-20 (×5): qty 1

## 2014-07-20 MED ORDER — ONDANSETRON HCL 4 MG/2ML IJ SOLN: 4.0000 mg | Freq: Four times a day (QID) | INTRAMUSCULAR | Status: DC | PRN

## 2014-07-20 MED ORDER — DEXAMETHASONE 0.5 MG PO TABS
0.5000 mg | ORAL_TABLET | Freq: Every day | ORAL | Status: DC
Start: 2014-07-20 — End: 2014-07-20

## 2014-07-20 NOTE — Progress Notes (Signed)
Admission note:   Arrival Method: Direct admit from [facility]. Mental Status: A&OX4. Telemetry: N/A - waiting on admission orders.  Skin: Intact, but appears to have scar tissue on sacrum/below buttocks (pt states he's had pressure wounds before). Darkened/discolored skin on BLE, but no open areas noted.  Tubes: RLQ Suprapubic catheter and LLQ Colostomy. IV: IV team notified. Pain: Denies.  Family: Wife and children at bedside. Living Situation: From home with family. Safety Measures: Call bell within reach, bed alarm in place. 6E Orientation: Oriented to unit and surroundings.  Joellen Jersey, RN.

## 2014-07-20 NOTE — H&P (Signed)
Date: 07/20/2014               Patient Name:  [name] MRN: [id number]  DOB: 1955/10/24 Age / Sex: 58 y.o., male   PCP: [name], MD         Medical Service: Internal Medicine Teaching Service         Attending Physician: Dr. Axel Filler, MD    First Contact: Dr. Albin Felling Pager: [phone]  Second Contact: Dr. Bing Neighbors Pager: [phone]       After Hours (After 5p/  First Contact Pager: [phone]  weekends / holidays): Second Contact Pager: [phone]   Chief Complaint: Nausea, vomiting, abdominal pain   History of Present Illness: 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 Patient was transferred to [facility] from [place]. Patient states yesterday while at church he became nauseous and vomited x 1. He states later that evening he noticed his urine catheter was clogged and that it looked cloudy. He states he drank a lot of water and noticed his urine bag was not filling up like it usually does. He describes abdominal pain that is diffuse, "spastic" with a sharp pain, and "feels tight." He also reports feeling sweaty and clammy. This morning, he reports more nausea and vomiting x 2, and that he threw up all the water he drank the evening prior. He states his wife noticed that he had stool coming from his rectum this morning. He reports this last happened last Wednesday. He does not know if the stool was loose, dark, or bloody. He does note increased gas in his colostomy bag. He reports lightheadedness and decreased appetite, but denies fevers and chills.  Meds: No current facility-administered medications for this encounter.    Allergies: Allergies as of 07/20/2014  . (No Known Allergies)     Past Medical History  Diagnosis Date  . Multiple myeloma     thoracic T8 with paraplegia s/p resection- on chemo at visit 10/13/10  . Neurogenic bladder   . Neurogenic bowel   . Partial small bowel obstruction during dec 2011 admission  . Multiple myeloma   . Multiple myeloma without mention of remission   . Cancer   . History of blood transfusion 7/14  . Paraplegia   . Gross hematuria 7/14    post foley cath procedure  . Coagulopathy     on xeralto/ s/p DVT while on coumadin,  IVC in place  . Adrenal insufficiency     on chronic dexamethasone  . Diabetes mellitus without complication    Past Surgical History  Procedure Laterality Date  . Myeloma thoracic t8 with parpaplegia s/p thoracotomy and thoracic t7-9 cage placement on dec 26th 2011  07/18/10  . Laparotomy  07/20/2011    Procedure: EXPLORATORY LAPAROTOMY;  Surgeon: Judieth Keens, DO;  Location: [place];  Service: General;  Laterality: N/A;  . Colostomy revision  07/20/2011    Procedure: COLON RESECTION SIGMOID;  Surgeon: Judieth Keens, DO;  Location: [place];  Service: General;;  . Colostomy  07/20/2011    Procedure: COLOSTOMY;  Surgeon: Judieth Keens, DO;  Location: [place];  Service: General;;  . Insertion of suprapubic catheter N/A 04/04/2013    Procedure: INSERTION  OF SUPRAPUBIC CATHETER;  Surgeon: Alexis Frock, MD;  Location: [facility];  Service: Urology;  Laterality: N/A;  . Cystoscopy N/A 04/04/2013    Procedure: CYSTOSCOPY WITH LITHALOPAXY;  Surgeon: Alexis Frock, MD;  Location: [facility];  Service: Urology;  Laterality: N/A;   No family history on file. History   Social History  . Marital Status: Married    Spouse Name: N/A    Number of Children: N/A  . Years of Education: N/A   Occupational History  . Not on file.   Social History Main Topics  . Smoking status: Never Smoker   . Smokeless tobacco: Never Used  . Alcohol Use: No  . Drug Use: No  . Sexual Activity: No   Other Topics Concern   . Not on file   Social History Narrative    Review of Systems: General: Denies night sweats, changes in weight HEENT: Denies headaches, ear pain, changes in vision, rhinorrhea, sore throat CV: Denies CP, palpitations, SOB, orthopnea Pulm: Denies SOB, cough, wheezing GI: See HPI GU: Denies dysuria, hematuria, frequency Msk: Denies muscle cramps, joint pains Neuro: Denies weakness, numbness, tingling Skin: Denies rashes, bruising  Physical Exam: Blood pressure 102/67, pulse 109, temperature 98.2 F (36.8 C), temperature source Oral, resp. rate 18, SpO2 97 %. General: alert, pleasant, resting in bed HEENT: [place]/AT, EOMI, sclera anicteric, mucus membranes dry CV: RRR, normal S1/S2, no m/g/r Pulm: CTA bilaterally, breaths non-labored Abd: BS+, soft, mildly distended, tenderness in epigastrium and RUQ. Left-sided colostomy in place, pink bowel visible, no stool.  Ext: warm, trace edema Neuro: alert and oriented x 3, CNs II-XII intact, strength 0/5 in lower extremities and 5/5 in upper extremities   Lab results: Basic Metabolic Panel: No results for input(s): NA, K, CL, CO2, GLUCOSE, BUN, CREATININE, CALCIUM, MG, PHOS in the last 72 hours. Liver Function Tests: No results for input(s): AST, ALT, ALKPHOS, BILITOT, PROT, ALBUMIN in the last 72 hours. No results for input(s): LIPASE, AMYLASE in the last 72 hours. No results for input(s): AMMONIA in the last 72 hours. CBC: No results for input(s): WBC, NEUTROABS, HGB, HCT, MCV, PLT in the last 72 hours. Cardiac Enzymes: No results for input(s): CKTOTAL, CKMB, CKMBINDEX, TROPONINI in the last 72 hours. BNP: No results for input(s): PROBNP in the last 72 hours. D-Dimer: No results for input(s): DDIMER in the last 72 hours. CBG: No results for input(s): GLUCAP in the last 72 hours. Hemoglobin A1C: No results for input(s): HGBA1C in the last 72 hours. Fasting Lipid Panel: No results for input(s): CHOL, HDL, LDLCALC, TRIG, CHOLHDL,  LDLDIRECT in the last 72 hours. Thyroid Function Tests: No results for input(s): TSH, T4TOTAL, FREET4, T3FREE, THYROIDAB in the last 72 hours. Anemia Panel: No results for input(s): VITAMINB12, FOLATE, FERRITIN, TIBC, IRON, RETICCTPCT in the last 72 hours. Coagulation: No results for input(s): LABPROT, INR in the last 72 hours. Urine Drug Screen: Drugs of Abuse  No results found for: LABOPIA, COCAINSCRNUR, LABBENZ, AMPHETMU, THCU, LABBARB  Alcohol Level: No results for input(s): ETH in the last 72 hours. Urinalysis: No results for input(s): COLORURINE, LABSPEC, PHURINE, GLUCOSEU, HGBUR, BILIRUBINUR, KETONESUR, PROTEINUR, UROBILINOGEN, NITRITE, LEUKOCYTESUR in the last 72 hours.  Invalid input(s): APPERANCEUR   Imaging results:  No results found.  Assessment & Plan by Problem: Active Problems:   Acute kidney failure Mr. 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   Abdominal Pain/N/V: Patient presents with 2 day history of diffuse abdominal pain, nausea, and vomiting. He has a history of diverticulitis with perforation resulting in an end-colostomy. He states he has noticed increased gas production in his colostomy bag. On exam, he is afebrile, has tenderness in the epigastrium and RUQ and appears mildly distended. Labs from [facility] show he has an elevated WBC count of 21. Unclear etiology of his symptoms at this point given limited labs and imaging. Differential includes SBO vs. Colitis/Diverticulitis vs. Acute Cholecystitis vs. Gastroenteritis.  - CT Abdomen without contrast given acute kidney failure - Check lactic acid - CBC - CMP - Zofran PRN  Acute Kidney Failure: Cr 2.24 according to [facility] labs.  Baseline around 0.8. Patient reported decreased urine output from his suprapubic catheter. He has had decreased appetite, nausea, and vomiting for the last 2 days. Possible that his AKI is from dehydration. However, he also mentioned that his catheter appeared blocked so an obstruction in the catheter may be present. Given his history of multiple myeloma there is concern for myeloma kidney or primary (light chain) amyloidosis.  - Bladder scan--> if large amount of urine present then more likely obstruction present. Otherwise more likely an intrarenal or prerenal etiology. - Monitor I&Os - Urine Na, urine creatinine - U/A - IVF @ 125 ml/hr  Adrenal Insufficiency: On chronic dexamethasone. He takes dexamethasone 1 mg daily at home. - Continue home Dexamethasone  Hx Bilateral DVTs with IVC Filter: Also on Xarelto at home.  - Continue home Xarelto  Type 2 DM: No HbA1c on record. Patient takes Metformin 500 mg BID at home.  - Hold Metformin - Sensitive ISS - CBGs 4 times daily   Diet: Regular  VTE PPx: Xarelto Dispo: Disposition is deferred at this time, awaiting improvement of current medical problems. Anticipated discharge in approximately 1-2 day(s).   The patient does have a current PCP [name], MD) and does need an [facility] hospital follow-up appointment after discharge.  The patient does not have transportation limitations that hinder transportation to clinic appointments.  Signed: Albin Felling, MD 07/20/2014, 4:16 PM

## 2014-07-20 NOTE — Progress Notes (Signed)
MD currently in patient's room. Awaiting admission orders.  Joellen Jersey, RN.

## 2014-07-20 NOTE — Plan of Care (Signed)
Problem: Phase I Progression Outcomes Goal: Voiding-avoid urinary catheter unless indicated Outcome: Not Applicable Date Met:  20/23/34 Patient has a chronic suprapubic catheter.

## 2014-07-21 ENCOUNTER — Ambulatory Visit (HOSPITAL_COMMUNITY): Admission: RE | Admit: 2014-07-21 | Payer: Medicare (Managed Care) | Source: Ambulatory Visit

## 2014-07-21 ENCOUNTER — Other Ambulatory Visit: Payer: Medicare (Managed Care)

## 2014-07-21 ENCOUNTER — Inpatient Hospital Stay (HOSPITAL_COMMUNITY): Payer: Medicare (Managed Care)

## 2014-07-21 ENCOUNTER — Encounter (HOSPITAL_COMMUNITY): Payer: Self-pay | Admitting: *Deleted

## 2014-07-21 DIAGNOSIS — N1330 Unspecified hydronephrosis: Secondary | ICD-10-CM | POA: Diagnosis present

## 2014-07-21 DIAGNOSIS — Z86718 Personal history of other venous thrombosis and embolism: Secondary | ICD-10-CM

## 2014-07-21 DIAGNOSIS — R338 Other retention of urine: Secondary | ICD-10-CM

## 2014-07-21 DIAGNOSIS — T83098A Other mechanical complication of other indwelling urethral catheter, initial encounter: Principal | ICD-10-CM

## 2014-07-21 DIAGNOSIS — T83090A Other mechanical complication of cystostomy catheter, initial encounter: Secondary | ICD-10-CM | POA: Diagnosis present

## 2014-07-21 DIAGNOSIS — B9689 Other specified bacterial agents as the cause of diseases classified elsewhere: Secondary | ICD-10-CM

## 2014-07-21 DIAGNOSIS — Z7901 Long term (current) use of anticoagulants: Secondary | ICD-10-CM

## 2014-07-21 DIAGNOSIS — N139 Obstructive and reflux uropathy, unspecified: Secondary | ICD-10-CM

## 2014-07-21 DIAGNOSIS — N178 Other acute kidney failure: Secondary | ICD-10-CM

## 2014-07-21 DIAGNOSIS — N390 Urinary tract infection, site not specified: Secondary | ICD-10-CM

## 2014-07-21 LAB — COMPREHENSIVE METABOLIC PANEL
ALT: 17 U/L (ref 0–53)
AST: 17 U/L (ref 0–37)
Albumin: 3 g/dL — ABNORMAL LOW (ref 3.5–5.2)
Alkaline Phosphatase: 78 U/L (ref 39–117)
Anion gap: 15 (ref 5–15)
BUN: 42 mg/dL — ABNORMAL HIGH (ref 6–23)
CO2: 17 mmol/L — ABNORMAL LOW (ref 19–32)
Calcium: 8 mg/dL — ABNORMAL LOW (ref 8.4–10.5)
Chloride: 102 mEq/L (ref 96–112)
Creatinine, Ser: 3.32 mg/dL — ABNORMAL HIGH (ref 0.50–1.35)
GFR calc Af Amer: 22 mL/min — ABNORMAL LOW (ref >90)
GFR calc non Af Amer: 19 mL/min — ABNORMAL LOW (ref >90)
Glucose, Bld: 102 mg/dL — ABNORMAL HIGH (ref 70–99)
Potassium: 4.7 mmol/L (ref 3.5–5.1)
Sodium: 134 mmol/L — ABNORMAL LOW (ref 135–145)
Total Bilirubin: 1.3 mg/dL — ABNORMAL HIGH (ref 0.3–1.2)
Total Protein: 7.8 g/dL (ref 6.0–8.3)

## 2014-07-21 LAB — URINALYSIS, ROUTINE W REFLEX MICROSCOPIC
Bilirubin Urine: NEGATIVE
Glucose, UA: NEGATIVE mg/dL
Ketones, ur: NEGATIVE mg/dL
Nitrite: NEGATIVE
Protein, ur: 100 mg/dL — AB
Specific Gravity, Urine: 1.013 (ref 1.005–1.030)
Urobilinogen, UA: 0.2 mg/dL (ref 0.0–1.0)
pH: 8 (ref 5.0–8.0)

## 2014-07-21 LAB — URIC ACID: Uric Acid, Serum: 8.2 mg/dL — ABNORMAL HIGH (ref 4.0–7.8)

## 2014-07-21 LAB — CBC WITH DIFFERENTIAL/PLATELET
Basophils Absolute: 0 10*3/uL (ref 0.0–0.1)
Basophils Relative: 0 % (ref 0–1)
Eosinophils Absolute: 0.2 10*3/uL (ref 0.0–0.7)
Eosinophils Relative: 1 % (ref 0–5)
HCT: 33.5 % — ABNORMAL LOW (ref 39.0–52.0)
Hemoglobin: 11.6 g/dL — ABNORMAL LOW (ref 13.0–17.0)
Lymphocytes Relative: 5 % — ABNORMAL LOW (ref 12–46)
Lymphs Abs: 1.1 10*3/uL (ref 0.7–4.0)
MCH: 23.2 pg — ABNORMAL LOW (ref 26.0–34.0)
MCHC: 34.6 g/dL (ref 30.0–36.0)
MCV: 66.9 fL — ABNORMAL LOW (ref 78.0–100.0)
Monocytes Absolute: 1.5 10*3/uL — ABNORMAL HIGH (ref 0.1–1.0)
Monocytes Relative: 7 % (ref 3–12)
Neutro Abs: 18.6 10*3/uL — ABNORMAL HIGH (ref 1.7–7.7)
Neutrophils Relative %: 87 % — ABNORMAL HIGH (ref 43–77)
Platelets: 452 10*3/uL — ABNORMAL HIGH (ref 150–400)
RBC: 5.01 MIL/uL (ref 4.22–5.81)
RDW: 17.2 % — ABNORMAL HIGH (ref 11.5–15.5)
WBC: 21.4 10*3/uL — ABNORMAL HIGH (ref 4.0–10.5)

## 2014-07-21 LAB — APTT: aPTT: 71 seconds — ABNORMAL HIGH (ref 24–37)

## 2014-07-21 LAB — MAGNESIUM: Magnesium: 1.5 mg/dL (ref 1.5–2.5)

## 2014-07-21 LAB — URINE MICROSCOPIC-ADD ON

## 2014-07-21 LAB — HEPARIN LEVEL (UNFRACTIONATED): Heparin Unfractionated: >2.2 IU/mL — ABNORMAL HIGH (ref 0.30–0.70)

## 2014-07-21 MED ORDER — CEFTRIAXONE SODIUM IN DEXTROSE 20 MG/ML IV SOLN
1.0000 g | INTRAVENOUS | Status: DC
  Administered 2014-07-21 – 2014-07-23 (×3): 1 g via INTRAVENOUS
  Filled 2014-07-21 (×4): qty 50

## 2014-07-21 MED ORDER — IOHEXOL 300 MG/ML  SOLN
25.0000 mL | INTRAMUSCULAR | Status: AC
  Administered 2014-07-21: 25 mL via ORAL

## 2014-07-21 MED ORDER — SODIUM CHLORIDE 0.9 % IV SOLN
INTRAVENOUS | Status: AC
Start: 2014-07-21 — End: 2014-07-22
  Administered 2014-07-21: via INTRAVENOUS

## 2014-07-21 MED ORDER — HEPARIN (PORCINE) IN NACL 100-0.45 UNIT/ML-% IJ SOLN
1400.0000 [IU]/h | INTRAMUSCULAR | Status: AC
Start: 2014-07-21 — End: 2014-07-22
  Administered 2014-07-21: 1300 [IU]/h via INTRAVENOUS
  Filled 2014-07-21 (×2): qty 250

## 2014-07-21 MED ORDER — INSULIN ASPART 100 UNIT/ML ~~LOC~~ SOLN
0.0000 [IU] | Freq: Three times a day (TID) | SUBCUTANEOUS | Status: DC
  Administered 2014-07-22: 1 [IU] via SUBCUTANEOUS
  Administered 2014-07-23: 2 [IU] via SUBCUTANEOUS
  Administered 2014-07-23: 1 [IU] via SUBCUTANEOUS

## 2014-07-21 NOTE — Progress Notes (Signed)
INITIAL NUTRITION ASSESSMENT  DOCUMENTATION CODES Per approved criteria  -Obesity Unspecified   INTERVENTION: Once diet advances, discontinue Ensure Complete and provide Glucerna Shake po BID, each supplement provides 220 kcal and 10 grams of protein.  NUTRITION DIAGNOSIS: Inadequate oral intake related to decreased appetite as evidenced by [name] report.   Goal: [name] to meet >/= 90% of their estimated nutrition needs   Monitor:  Diet advancement, weight trends, labs, I/O's  Reason for Assessment: MST  58 y.o. 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 He has had decreased appetite, nausea, and vomiting for the last 2 days.  [name] reports he has been having a decreased appetite which had started 2-3 days a ago. [name] however reports his appetite is currently coming back. Prior to his decreased appetite, [name] reports eating well with 3 full meals a day with no other difficulties. Weight has been stable. [name] has Ensure ordered and is agreeable to switching to Glucerna. RD to modify orders once diet advances.   [name] with no observed significant fat or muscle mass loss.   Labs: Low sodium, CO2, calcium, and GFR. High BUN, creatinine, and total bilirubin.  Height: Ht Readings from Last 1 Encounters:  07/20/14 5' 8"  (1.727 m)    Weight: Wt Readings from Last 1 Encounters:  07/20/14 250 lb 3.6 oz (113.5 kg)    Ideal Body Weight: 154 lbs  % Ideal Body Weight: 162%  Wt Readings from Last 10 Encounters:  07/20/14 250 lb 3.6 oz (113.5 kg)  04/04/13 241 lb (109.317 kg)  02/17/13 289 lb 12.8 oz (131.452 kg)  11/22/12 270 lb (122.471 kg)  07/27/11 234 lb 11.2 oz (106.459 kg)   05/01/11 230 lb (104.327 kg)  07/13/10 272 lb (123.378 kg)  07/11/10 272 lb (123.378 kg)  07/07/10 272 lb (123.378 kg)  11/01/06 273 lb 14.4 oz (124.24 kg)    Usual Body Weight: 250 lbs  % Usual Body Weight: 100%  BMI:  Body mass index is 38.05 kg/(m^2). Class II obesity  Estimated Nutritional Needs: Kcal: 2100-2300 Protein: 90-110 grams Fluid: Per MD  Skin: +1 LE edema  Diet Order: Diet NPO time specified  EDUCATION NEEDS: -No education needs identified at this time   Intake/Output Summary (Last 24 hours) at 07/21/14 1348 Last data filed at 07/21/14 1120  Gross per 24 hour  Intake    280 ml  Output   2637 ml  Net  -2357 ml    Last BM: Colostomy LLQ  Labs:   Recent Labs Lab 07/20/14 2004 07/21/14 0556  NA 134* 134*  K 4.4 4.7  CL 99 102  CO2 20 17*  BUN 35* 42*  CREATININE 2.77* 3.32*  CALCIUM 8.7 8.0*  MG  --  1.5  GLUCOSE 110* 102*    CBG (last 3)   Recent Labs  07/20/14 1741  GLUCAP 104*    Scheduled Meds: . dexamethasone  0.5 mg Oral Daily  . feeding supplement (ENSURE COMPLETE)  237 mL Oral BID BM    Continuous Infusions: . heparin      Past Medical History  Diagnosis Date  . Multiple myeloma     thoracic T8 with paraplegia s/p resection- on chemo at visit 10/13/10  .  Neurogenic bladder   . Neurogenic bowel   . Partial small bowel obstruction during dec 2011 admission  . Multiple myeloma   . Multiple myeloma without mention of remission   . Cancer   . History of blood transfusion 7/14  . Paraplegia   . Gross hematuria 7/14    post foley cath procedure  . Coagulopathy     on xeralto/ s/p DVT while on coumadin,  IVC in place  . Adrenal insufficiency     on chronic dexamethasone  . Diabetes mellitus without complication   . Anemia     Past Surgical History  Procedure Laterality Date  . Myeloma thoracic t8 with parpaplegia s/p thoracotomy and thoracic t7-9 cage placement on dec 26th 2011  07/18/10  . Laparotomy   07/20/2011    Procedure: EXPLORATORY LAPAROTOMY;  Surgeon: Judieth Keens, DO;  Location: [place];  Service: General;  Laterality: N/A;  . Colostomy revision  07/20/2011    Procedure: COLON RESECTION SIGMOID;  Surgeon: Judieth Keens, DO;  Location: [place];  Service: General;;  . Colostomy  07/20/2011    Procedure: COLOSTOMY;  Surgeon: Judieth Keens, DO;  Location: [place];  Service: General;;  . Insertion of suprapubic catheter N/A 04/04/2013    Procedure: INSERTION OF SUPRAPUBIC CATHETER;  Surgeon: Alexis Frock, MD;  Location: [facility];  Service: Urology;  Laterality: N/A;  . Cystoscopy N/A 04/04/2013    Procedure: CYSTOSCOPY WITH LITHALOPAXY;  Surgeon: Alexis Frock, MD;  Location: [facility];  Service: Urology;  Laterality: N/A;    Kallie Locks, MS, RD, LDN Pager # [phone] After hours/ weekend pager # [phone]

## 2014-07-21 NOTE — Progress Notes (Addendum)
ANTICOAGULATION CONSULT NOTE - Initial Consult  Pharmacy Consult for Heparin Indication: Hx of DVT  No Known Allergies  Patient Measurements: Height: 5' 8"  (172.7 cm) Weight: 250 lb 3.6 oz (113.5 kg) IBW/kg (Calculated) : 68.4 Heparin Dosing Weight: 94 kg  Vital Signs: Temp: 98.5 F (36.9 C) (12/29 1020) Temp Source: Oral (12/29 1020) BP: 115/70 mmHg (12/29 1020) Pulse Rate: 80 (12/29 1020)  Labs:  Recent Labs  07/20/14 2004 07/21/14 0556  HGB 12.2* 11.6*  HCT 35.4* 33.5*  PLT 459* 452*  CREATININE 2.77* 3.32*    Estimated Creatinine Clearance: 28.9 mL/min (by C-G formula based on Cr of 3.32).   Medical History: Past Medical History  Diagnosis Date  . Multiple myeloma     thoracic T8 with paraplegia s/p resection- on chemo at visit 10/13/10  . Neurogenic bladder   . Neurogenic bowel   . Partial small bowel obstruction during dec 2011 admission  . Multiple myeloma   . Multiple myeloma without mention of remission   . Cancer   . History of blood transfusion 7/14  . Paraplegia   . Gross hematuria 7/14    post foley cath procedure  . Coagulopathy     on xeralto/ s/p DVT while on coumadin,  IVC in place  . Adrenal insufficiency     on chronic dexamethasone  . Diabetes mellitus without complication   . Anemia     Assessment: 58 yo M presents on 12/28 with nausea, vomiting, and abdominal pain. On Xarelto for hx of DVT at home. SCr trending up to 3.32, CrCl ~61m/min. Pharmacy to bridge to heparin from Xarelto due to poor renal function for hx of DVT. Last dose of Xarelto was last night at 2000. CBC ok, Hgb trending down to 11.6, plts 452. No s/s of bleed documented.  Goal of Therapy:  APTT goal 66-102 Heparin level 0.3-0.7 units/ml Monitor platelets by anticoagulation protocol: Yes   Plan:  Hold Xarelto Start heparin gtt at 1300 units/hr (~13units/kg) at 2000 with NO bolus Check 8 hr HL and APTT at 0400 tomorrow Monitor daily HL, CBC, s/s of bleed F/U  baseline APTT and HL Will expect HL to be elevated due to effect of Xarelto use. Will initially titrate heparin based on APTT levels  BATCHELDER,NATHAN J 07/21/2014,1:11 PM  Addendum:  Baseline aPTT and anti-Xa level were checked before starting IV heparin. Anti-Xa level is elevated at 2.2 as expected d/t xarelto. APTT = 71 still slightly elevated d/t poor renal function. I think it is appropriate to start IV heparin with lower rate and no bolus now 24 hrs after last xarelto dose.  Plan: Start IV heparin 1300 units/hr with no bolus now F/u AM aPTT and anti-Xa level.  MMaryanna Shape PharmD, BCPS  Clinical Pharmacist  Pager: [phone]

## 2014-07-21 NOTE — Progress Notes (Signed)
Subjective: Called with urgent CT Abd/Pelvis results this AM. Patient had a markedly enlarged bladder and severe hydronephrosis bilaterally. Urology was consulted immediately and was able to change out the suprapubic catheter and drained 1400 ml from the bladder. Patient had significant abdominal pain but was immediately relieved after the catheter was replaced.   Objective: Vital signs in last 24 hours: Filed Vitals:   07/20/14 2000 07/20/14 2100 07/21/14 0558 07/21/14 1020  BP:  100/61 122/78 115/70  Pulse:  89 84 80  Temp:  98.2 F (36.8 C) 99.3 F (37.4 C) 98.5 F (36.9 C)  TempSrc:  Oral  Oral  Resp: 19 16 19 19   Height:      Weight:  113.5 kg (250 lb 3.6 oz)    SpO2:  98% 97% 97%   Weight change:   Intake/Output Summary (Last 24 hours) at 07/21/14 1411 Last data filed at 07/21/14 1120  Gross per 24 hour  Intake    280 ml  Output   2637 ml  Net  -2357 ml   Physical Exam General: alert, pleasant, sitting up in bed HEENT: [place]/AT, EOMI, mucus membranes moist  CV: RRR, normal S1/S2, no m/g/r Pulm: CTA bilaterally, breaths non-labored Abd: BS+, abdomen distended, tenderness to palpation especially in lower abdomen Ext: warm, no edema, moves all Neuro: alert and oriented x 3, CNs II-XII intact  Lab Results: Basic Metabolic Panel:  Recent Labs Lab 07/20/14 2004 07/21/14 0556  NA 134* 134*  K 4.4 4.7  CL 99 102  CO2 20 17*  GLUCOSE 110* 102*  BUN 35* 42*  CREATININE 2.77* 3.32*  CALCIUM 8.7 8.0*  MG  --  1.5   Liver Function Tests:  Recent Labs Lab 07/20/14 2004 07/21/14 0556  AST 16 17  ALT 18 17  ALKPHOS 66 78  BILITOT 1.0 1.3*  PROT 7.7 7.8  ALBUMIN 3.2* 3.0*    Recent Labs Lab 07/20/14 2004  LIPASE 25   CBC:  Recent Labs Lab 07/20/14 2004 07/21/14 0556  WBC 20.3* 21.4*  NEUTROABS  --  18.6*  HGB 12.2* 11.6*  HCT 35.4* 33.5*  MCV 66.9* 66.9*  PLT 459* 452*    CBG:  Recent Labs Lab 07/20/14 1741  GLUCAP 104*    Urinalysis:  Recent Labs Lab 07/21/14 1123  COLORURINE YELLOW  LABSPEC 1.013  PHURINE 8.0  GLUCOSEU NEGATIVE  HGBUR LARGE*  BILIRUBINUR NEGATIVE  KETONESUR NEGATIVE  PROTEINUR 100*  UROBILINOGEN 0.2  NITRITE NEGATIVE  LEUKOCYTESUR LARGE*   Studies/Results: Ct Abdomen Pelvis Wo Contrast  07/21/2014   ADDENDUM REPORT: 07/21/2014 07:43  ADDENDUM: These results were called by telephone at the time of interpretation on 07/21/2014 at 7:42 am to Dr. Osa Craver , who verbally acknowledged these results.   Electronically Signed   By: Lowella Grip M.D.   On: 07/21/2014 07:43   07/21/2014   CLINICAL DATA:  Generalized abdominal pain and distention.  EXAM: CT ABDOMEN AND PELVIS WITHOUT CONTRAST  TECHNIQUE: Multidetector CT imaging of the abdomen and pelvis was performed following the standard protocol without IV contrast. Oral contrast was administered.  COMPARISON:  June 24, 2012  FINDINGS: There is patchy infiltrate in the left base. There is atelectatic change in the right base. There is evidence of old rib trauma on the left. There are scattered foci of coronary artery calcification.  There is fatty infiltration near the fissure for the ligamentum teres. No focal liver lesions are identified on this noncontrast enhanced study. There is cholelithiasis.  The gallbladder wall does not appear thickened. There is no appreciable biliary duct dilatation.  Spleen, pancreas, and adrenals appear normal.  There is severe hydronephrosis bilaterally. There is no renal mass or calculus on either side. There is no ureteral calculus.  There is a filter in the inferior vena cava with the apex directed superiorly. There is no fluid surrounding the inferior vena cava. There is atherosclerotic change in aorta without aneurysm.  In the pelvis, the urinary bladder is markedly distended and most likely the cause of the hydronephrosis. A suprapubic catheter is positioned within the bladder. There is no pelvic  mass or pelvic fluid collection. The prostate has a somewhat inhomogeneous appearance. There is fat in each inguinal ring.  The appendix appears normal. There is no appreciable bowel obstruction. There is a left lower quadrant ostomy with bowel herniating into the ostomy. No bowel compromise is seen. There is no free air or portal venous air.  There is no appreciable ascites, adenopathy, or abscess in the abdomen or pelvis. There is scarring in the anterior abdominal wall consistent with previous surgery. There is postoperative change in the lower thoracic region. There is degenerative change in the lumbar spine. There are no blastic or lytic bone lesions. There are multiple foci of calcification in each hip joint. There is stable anterior wedging of the T12 vertebral body.  IMPRESSION: Markedly enlarged urinary bladder. Suprapubic catheter is present. Question nonfunctioning suprapubic catheter.  Severe hydronephrosis bilaterally, likely due to stasis phenomenon from the marked urinary bladder distention.  Left lower quadrant ostomy. Bowel herniates into this ostomy, but there is no bowel compromise.  Filter in inferior vena cava.  Patchy infiltrate left base.  Atelectasis right base.  Stable wedging of the T12 vertebral body. Postoperative change in the lower thoracic spine.  Appendix appears normal.  Somewhat inhomogeneous appearing prostate. This finding may warrant PSA to further assess.  Extensive calcification each hip joint.  Electronically Signed: By: Lowella Grip M.D. On: 07/21/2014 07:32   Medications: I have reviewed the patient's current medications. Scheduled Meds: . dexamethasone  0.5 mg Oral Daily  . feeding supplement (ENSURE COMPLETE)  237 mL Oral BID BM   Continuous Infusions: . heparin     PRN Meds:.methocarbamol, ondansetron **OR** ondansetron (ZOFRAN) IV Assessment/Plan: Active Problems:   Paraplegia   Neurogenic bladder   Multiple myeloma   Neurogenic bowel   Acute kidney  failure   Abdominal pain   AKI (acute kidney injury) Abdominal Pain/N/V: Patient reports no nausea or vomiting this AM. He did have abdominal pain but this was most likely related to his distended bladder and hydronephrosis (see below). His WBC count continues to be elevated at 20.3>21.4. He has been afebrile. Lactic acid normal. His leukocytosis is most likely related to his AKI. He is at risk for urinary infection since he was oliguric for the last 24 hours. CT Abd/Pelvis did not show any other source for possible infection.  - f/u urine culture - Trend WBC - Monitor I&Os - Continue to monitor pain   Acute Kidney Failure: Cr 2.2 on admission. Bladder scan performed and showed only 87 mls in the bladder. However, CT Abd/Pelvis this morning showed a markedly increased bladder and severe bilateral hydronephrosis. Patient's Cr 3.32 this AM. Urology was consulted immediately and were able to change out suprapubic catheter this morning and drained 1400 ml of urine. Patient's suprapubic catheter was source for obstruction leading to AKI. Patient's abdominal pain significantly improved afterwards.  - Repeat bladder  scan this evening to ensure catheter working properly - Monitor I&Os - Repeat bmet in AM  Adrenal Insufficiency: On chronic dexamethasone. He takes dexamethasone 1 mg daily at home. - Continue home Dexamethasone  Hx Bilateral DVTs with IVC Filter: Also on Xarelto at home. Patient with elevated Cr of 3.32 and GFR <30 due to AKI 2/2 obstruction which has now been treated. Will hold Xarelto for now and bridge with heparin since Xarelto not recommended for GFR <30.  - Hold Xarelto. Can restart once kidney function improves.  - Start IV heparin per pharmacy  Type 2 DM: No HbA1c on record. Patient takes Metformin 500 mg BID at home.  - Hold Metformin  Diet: Regular  VTE PPx: IV Heparin Dispo: Disposition is deferred at this time, awaiting improvement of current medical problems. Anticipated  discharge in approximately 1-2 day(s).   The patient does have a current PCP [name], MD) and does need an [facility] hospital follow-up appointment after discharge.  The patient does not have transportation limitations that hinder transportation to clinic appointments.  .Services Needed at time of discharge: Y = Yes, Blank = No PT:   OT:   RN:   Equipment:   Other:     LOS: 1 day   Albin Felling, MD 07/21/2014, 2:11 PM

## 2014-07-22 DIAGNOSIS — N319 Neuromuscular dysfunction of bladder, unspecified: Secondary | ICD-10-CM

## 2014-07-22 DIAGNOSIS — G8220 Paraplegia, unspecified: Secondary | ICD-10-CM

## 2014-07-22 DIAGNOSIS — K592 Neurogenic bowel, not elsewhere classified: Secondary | ICD-10-CM

## 2014-07-22 LAB — CBC
HCT: 29.9 % — ABNORMAL LOW (ref 39.0–52.0)
Hemoglobin: 10.2 g/dL — ABNORMAL LOW (ref 13.0–17.0)
MCH: 23.4 pg — ABNORMAL LOW (ref 26.0–34.0)
MCHC: 34.1 g/dL (ref 30.0–36.0)
MCV: 68.7 fL — ABNORMAL LOW (ref 78.0–100.0)
Platelets: 389 10*3/uL (ref 150–400)
RBC: 4.35 MIL/uL (ref 4.22–5.81)
RDW: 17.2 % — ABNORMAL HIGH (ref 11.5–15.5)
WBC: 18.2 10*3/uL — ABNORMAL HIGH (ref 4.0–10.5)

## 2014-07-22 LAB — BASIC METABOLIC PANEL
Anion gap: 10 (ref 5–15)
BUN: 29 mg/dL — ABNORMAL HIGH (ref 6–23)
CO2: 22 mmol/L (ref 19–32)
Calcium: 8.6 mg/dL (ref 8.4–10.5)
Chloride: 106 mEq/L (ref 96–112)
Creatinine, Ser: 1.2 mg/dL (ref 0.50–1.35)
GFR calc Af Amer: 74 mL/min — ABNORMAL LOW (ref >90)
GFR calc non Af Amer: 64 mL/min — ABNORMAL LOW (ref >90)
Glucose, Bld: 116 mg/dL — ABNORMAL HIGH (ref 70–99)
Potassium: 3.9 mmol/L (ref 3.5–5.1)
Sodium: 138 mmol/L (ref 135–145)

## 2014-07-22 LAB — APTT: aPTT: 57 seconds — ABNORMAL HIGH (ref 24–37)

## 2014-07-22 LAB — GLUCOSE, CAPILLARY
Glucose-Capillary: 144 mg/dL — ABNORMAL HIGH (ref 70–99)
Glucose-Capillary: 128 mg/dL — ABNORMAL HIGH (ref 70–99)
Glucose-Capillary: 120 mg/dL — ABNORMAL HIGH (ref 70–99)

## 2014-07-22 LAB — HEMOGLOBIN A1C
Hgb A1c MFr Bld: 6 % — ABNORMAL HIGH (ref <5.7)
Mean Plasma Glucose: 126 mg/dL — ABNORMAL HIGH (ref <117)

## 2014-07-22 LAB — HEPARIN LEVEL (UNFRACTIONATED): Heparin Unfractionated: >2.2 IU/mL — ABNORMAL HIGH (ref 0.30–0.70)

## 2014-07-22 MED ORDER — GLUCERNA SHAKE PO LIQD
237.0000 mL | Freq: Two times a day (BID) | ORAL | Status: DC
  Administered 2014-07-22 – 2014-07-24 (×4): 237 mL via ORAL

## 2014-07-22 MED ORDER — RIVAROXABAN 20 MG PO TABS
20.0000 mg | ORAL_TABLET | Freq: Every day | ORAL | Status: DC
  Administered 2014-07-23: 20 mg via ORAL
  Filled 2014-07-22 (×2): qty 1

## 2014-07-22 MED ORDER — SODIUM CHLORIDE 0.9 % IV SOLN
INTRAVENOUS | Status: AC
  Administered 2014-07-22: 12:00:00 via INTRAVENOUS

## 2014-07-22 MED ORDER — RIVAROXABAN 20 MG PO TABS
20.0000 mg | ORAL_TABLET | Freq: Every day | ORAL | Status: AC
  Administered 2014-07-22: 20 mg via ORAL
  Filled 2014-07-22: qty 1

## 2014-07-22 NOTE — Plan of Care (Signed)
Problem: Phase I Progression Outcomes Goal: OOB as tolerated unless otherwise ordered Outcome: Not Applicable Date Met:  07/22/14 Pt moves self or with assistance every 2 hours. Pt is paraplegic. Declines offer to use lift to get out of bed.  Goal: Initial discharge plan identified Outcome: Completed/Met Date Met:  07/22/14 Return home with family     

## 2014-07-22 NOTE — Discharge Instructions (Signed)
Information on my medicine - XARELTO (rivaroxaban)  This medication education was reviewed with me or my healthcare representative as part of my discharge preparation.  The pharmacist that spoke with me during my hospital stay was:  Reginia Naas, [place]? Xarelto was prescribed to treat blood clots that may have been found in the veins of your legs (deep vein thrombosis) or in your lungs (pulmonary embolism) and to reduce the risk of them occurring again.  What do you need to know about Xarelto? Take Xarelto 20 mg tablet taken ONCE A DAY with your evening meal.  DO NOT stop taking Xarelto without talking to the health care provider who prescribed the medication.  Refill your prescription for 20 mg tablets before you run out.  After discharge, you should have regular check-up appointments with your healthcare provider that is prescribing your Xarelto.  In the future your dose may need to be changed if your kidney function changes by a significant amount.  What do you do if you miss a dose? If you are taking Xarelto ONCE DAILY and you miss a dose, take it as soon as you remember on the same day then continue your regularly scheduled once daily regimen the next day. Do not take two doses of Xarelto at the same time.   Important Safety Information Xarelto is a blood thinner medicine that can cause bleeding. You should call your healthcare provider right away if you experience any of the following: ? Bleeding from an injury or your nose that does not stop. ? Unusual colored urine (red or dark brown) or unusual colored stools (red or black). ? Unusual bruising for unknown reasons. ? A serious fall or if you hit your head (even if there is no bleeding).  Some medicines may interact with Xarelto and might increase your risk of bleeding while on Xarelto. To help avoid this, consult your healthcare provider or pharmacist prior to using any new  prescription or non-prescription medications, including herbals, vitamins, non-steroidal anti-inflammatory drugs (NSAIDs) and supplements.  This website has more information on Xarelto: [email or web address]

## 2014-07-22 NOTE — Progress Notes (Signed)
ANTICOAGULATION CONSULT NOTE - Follow Up Consult  Pharmacy Consult for Xarelto and heparin Indication: Hx of DVT  No Known Allergies  Patient Measurements: Height: 5\' 8"  (172.7 cm) Weight: 250 lb 3.6 oz (113.5 kg) IBW/kg (Calculated) : 68.4 Heparin Dosing Weight: 94 kg  Vital Signs: Temp: 97.5 F (36.4 C) (12/30 0514) Temp Source: Oral (12/30 0514) BP: 100/69 mmHg (12/30 0514) Pulse Rate: 67 (12/30 0514)  Labs:  Recent Labs  07/20/14 2004 07/21/14 0556 07/21/14 1846 07/22/14 0520  HGB 12.2* 11.6*  --  10.2*  HCT 35.4* 33.5*  --  29.9*  PLT 459* 452*  --  389  APTT  --   --  71* 57*  HEPARINUNFRC  --   --  >2.20* >2.20*  CREATININE 2.77* 3.32*  --  1.20    Estimated Creatinine Clearance: 80 mL/min (by C-G formula based on Cr of 1.2).  Assessment: 58 yo M presents on 12/28 with nausea, vomiting, and abdominal pain. On Xarelto for hx of DVT at home. Pharmacy to bridge to heparin from Xarelto due to poor renal function for hx of DVT. APTT now slightly subtherapeutic at 57, will increase heparin gtt slightly. SCr 1.2, CrCl ~27ml/min so will plan to restart Xarelto and d/c heparin gtt once first dose given. Hgb trending down to 10.2, plts wnl. No s/s of bleed documented.  Goal of Therapy:  aPTT 66-102 seconds Monitor platelets by anticoagulation protocol: Yes   Plan:  Increase heparin gtt to 1400 units/hr Restart Xarelto 20mg  PO with lunch D/C heparin gtt when first dose of Xarelto given Monitor CBC, s/s of bleed  [name] J 07/22/2014,10:13 AM

## 2014-07-22 NOTE — Progress Notes (Signed)
Subjective: Patient reports his abdominal pain has improved significantly. His suprapubic catheter is functioning well and he is -7L of urine. His Cr significantly improved to 1.2 today. Will start IVF to replace some fluid losses.  Objective: Vital signs in last 24 hours: Filed Vitals:   07/21/14 1020 07/21/14 1700 07/21/14 2012 07/22/14 0514  BP: 115/70 101/56 99/75 100/69  Pulse: 80 87 70 67  Temp: 98.5 F (36.9 C) 98 F (36.7 C) 98.7 F (37.1 C) 97.5 F (36.4 C)  TempSrc: Oral Oral Oral Oral  Resp: _0 Height:      Weight:      SpO2: 97% 98% 96% 100%   Weight change:   Intake/Output Summary (Last 24 hours) at 07/22/14 1048 Last data filed at 07/22/14 0525  Gross per 24 hour  Intake   1250 ml  Output   5900 ml  Net  -4650 ml   Physical Exam General: alert, pleasant, sitting up in bed, NAD HEENT: [place]/AT, EOMI, mucus membranes moist  CV: RRR, normal S1/S2, no m/g/r Pulm: CTA bilaterally, breaths non-labored Abd: BS+, soft, non-tender, non-distended Ext: warm, no edema, moves all Neuro: alert and oriented x 3, CNs II-XII intact  Lab Results: Basic Metabolic Panel:  Recent Labs Lab 07/21/14 0556 07/22/14 0520  NA 134* 138  K 4.7 3.9  CL 102 106  CO2 17* 22  GLUCOSE 102* 116*  BUN 42* 29*  CREATININE 3.32* 1.20  CALCIUM 8.0* 8.6  MG 1.5  --    Liver Function Tests:  Recent Labs Lab 07/20/14 2004 07/21/14 0556  AST 16 17  ALT 18 17  ALKPHOS 66 78  BILITOT 1.0 1.3*  PROT 7.7 7.8  ALBUMIN 3.2* 3.0*    Recent Labs Lab 07/20/14 2004  LIPASE 25   CBC:  Recent Labs Lab 07/21/14 0556 07/22/14 0520  WBC 21.4* 18.2*  NEUTROABS 18.6*  --   HGB 11.6* 10.2*  HCT 33.5* 29.9*  MCV 66.9* 68.7*  PLT 452* 389   CBG:  Recent Labs Lab 07/20/14 1741  GLUCAP 104*   Urinalysis:  Recent Labs Lab 07/21/14 1123  COLORURINE YELLOW  LABSPEC 1.013  PHURINE 8.0  GLUCOSEU NEGATIVE  HGBUR LARGE*  BILIRUBINUR NEGATIVE  KETONESUR  NEGATIVE  PROTEINUR 100*  UROBILINOGEN 0.2  NITRITE NEGATIVE  LEUKOCYTESUR LARGE*   Studies/Results: Ct Abdomen Pelvis Wo Contrast  07/21/2014   ADDENDUM REPORT: 07/21/2014 07:43  ADDENDUM: These results were called by telephone at the time of interpretation on 07/21/2014 at 7:42 am to Dr. Osa Craver , who verbally acknowledged these results.   Electronically Signed   By: Lowella Grip M.D.   On: 07/21/2014 07:43   07/21/2014   CLINICAL DATA:  Generalized abdominal pain and distention.  EXAM: CT ABDOMEN AND PELVIS WITHOUT CONTRAST  TECHNIQUE: Multidetector CT imaging of the abdomen and pelvis was performed following the standard protocol without IV contrast. Oral contrast was administered.  COMPARISON:  June 24, 2012  FINDINGS: There is patchy infiltrate in the left base. There is atelectatic change in the right base. There is evidence of old rib trauma on the left. There are scattered foci of coronary artery calcification.  There is fatty infiltration near the fissure for the ligamentum teres. No focal liver lesions are identified on this noncontrast enhanced study. There is cholelithiasis. The gallbladder wall does not appear thickened. There is no appreciable biliary duct dilatation.  Spleen, pancreas, and adrenals appear normal.  There is severe hydronephrosis bilaterally.  There is no renal mass or calculus on either side. There is no ureteral calculus.  There is a filter in the inferior vena cava with the apex directed superiorly. There is no fluid surrounding the inferior vena cava. There is atherosclerotic change in aorta without aneurysm.  In the pelvis, the urinary bladder is markedly distended and most likely the cause of the hydronephrosis. A suprapubic catheter is positioned within the bladder. There is no pelvic mass or pelvic fluid collection. The prostate has a somewhat inhomogeneous appearance. There is fat in each inguinal ring.  The appendix appears normal. There is no  appreciable bowel obstruction. There is a left lower quadrant ostomy with bowel herniating into the ostomy. No bowel compromise is seen. There is no free air or portal venous air.  There is no appreciable ascites, adenopathy, or abscess in the abdomen or pelvis. There is scarring in the anterior abdominal wall consistent with previous surgery. There is postoperative change in the lower thoracic region. There is degenerative change in the lumbar spine. There are no blastic or lytic bone lesions. There are multiple foci of calcification in each hip joint. There is stable anterior wedging of the T12 vertebral body.  IMPRESSION: Markedly enlarged urinary bladder. Suprapubic catheter is present. Question nonfunctioning suprapubic catheter.  Severe hydronephrosis bilaterally, likely due to stasis phenomenon from the marked urinary bladder distention.  Left lower quadrant ostomy. Bowel herniates into this ostomy, but there is no bowel compromise.  Filter in inferior vena cava.  Patchy infiltrate left base.  Atelectasis right base.  Stable wedging of the T12 vertebral body. Postoperative change in the lower thoracic spine.  Appendix appears normal.  Somewhat inhomogeneous appearing prostate. This finding may warrant PSA to further assess.  Extensive calcification each hip joint.  Electronically Signed: By: Lowella Grip M.D. On: 07/21/2014 07:32   Medications: I have reviewed the patient's current medications. Scheduled Meds: . cefTRIAXone (ROCEPHIN)  IV  1 g Intravenous Q24H  . dexamethasone  0.5 mg Oral Daily  . feeding supplement (ENSURE COMPLETE)  237 mL Oral BID BM  . insulin aspart  0-9 Units Subcutaneous TID WC  . rivaroxaban  20 mg Oral Q lunch  . [START ON 07/23/2014] rivaroxaban  20 mg Oral Q supper   Continuous Infusions: . sodium chloride    . heparin 1,300 Units/hr (07/21/14 2036)   PRN Meds:.methocarbamol, ondansetron **OR** ondansetron (ZOFRAN) IV Assessment/Plan: Active Problems:    Paraplegia   Neurogenic bladder   Multiple myeloma   Neurogenic bowel   Acute kidney failure   Abdominal pain   AKI (acute kidney injury)   Obstructed suprapubic catheter   Bilateral hydronephrosis  Acute Kidney Failure: Cr now improved to 1.2 this morning. Patient also notes his abdominal pain has improved. Most likely his pain was related to his urinary obstruction and distended bladder. A new suprapubic catheter was replaced yesterday. Patient has diuresed 7 L since catheter replaced. Likely result of post-obstructive diuresis.  Will replace half of his losses with IVF (3.5 L). Patient already received 1 L bolus last night since BP in 48J systolic. WBC count trending down from 21>18. Leukocytosis possibly from UTI since he was oliguric before catheter was replaced.  - Start IVF @ 125 ml/hr for 24 hours (equals 2.5 L) - Strict I&Os - Repeat bmet in AM - f/u urine culture  Adrenal Insufficiency: On chronic dexamethasone. He takes dexamethasone 1 mg daily at home. - Continue home Dexamethasone  Hx Bilateral DVTs with IVC Filter:  Also on Xarelto at home. Xarelto held yesterday given his urinary obstruction causing AKI. Will restart Xarelto today and take off heparin per pharmacy. - Restart Xarelto at lunchtime. Will take off of heparin once Xarelto given.   Type 2 DM: No HbA1c on record. Patient takes Metformin 500 mg BID at home.  - Hold Metformin - Sensitive ISS  Diet: Regular  VTE PPx: IV Heparin, restarting Xarelto at lunchtime. Will stop heparin at that point Dispo: Disposition is deferred at this time, awaiting improvement of current medical problems. Anticipated discharge in approximately 1-2 day(s).   The patient does have a current PCP [name], MD) and does need an [facility] hospital follow-up appointment after discharge.  The patient does not have transportation limitations that hinder transportation to clinic appointments.  .Services Needed at time of discharge: Y =  Yes, Blank = No PT:   OT:   RN:   Equipment:   Other:     LOS: 2 days   Albin Felling, MD 07/22/2014, 10:48 AM

## 2014-07-22 NOTE — Progress Notes (Signed)
NUTRITION FOLLOW UP  DOCUMENTATION CODES Per approved criteria  -Obesity Unspecified   INTERVENTION: Discontinue Ensure Complete.  Provide Glucerna Shake po BID, each supplement provides 220 kcal and 10 grams of protein.  Encourage adequate PO intake.  NUTRITION DIAGNOSIS: Inadequate oral intake related to decreased appetite as evidenced by pt report; improving  Goal: Pt to meet >/= 90% of their estimated nutrition needs; met  Monitor:  PO intake, weight trends, labs, I/O's  58 y.o. 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 He has had decreased appetite, nausea, and vomiting for the last 2 days.  Meal completion has been 100%. Pt has Ensure complete ordered, however is agreeable on switching to Glucerna Shake. RD to modify orders. Pt was encouraged to eat his food at meals and to take his supplements.   Labs: Low GFR. High BUN, creatinine.  Height: Ht Readings from Last 1 Encounters:  07/20/14 5' 8" (1.727 m)    Weight: Wt Readings from Last 1 Encounters:  07/20/14 250 lb 3.6 oz (113.5 kg)    BMI:  Body mass index is 38.05 kg/(m^2). Class II obesity  Re-Estimated Nutritional Needs: Kcal: 2100-2300 Protein: 90-110 grams Fluid: 2.1 - 2.3 L/day  Skin: non-pitting LE edema  Diet Order: Diet regular   Intake/Output Summary (Last 24 hours) at 07/22/14 1427 Last data filed at 07/22/14 1108  Gross per 24 hour  Intake 1439.71 ml  Output   5650 ml  Net -4210.29 ml    Last BM: Colostomy LLQ  Labs:   Recent Labs Lab 07/20/14 2004 07/21/14 0556 07/22/14 0520  NA 134* 134* 138  K 4.4 4.7 3.9  CL 99 102 106  CO2 20 17* 22  BUN 35*  42* 29*  CREATININE 2.77* 3.32* 1.20  CALCIUM 8.7 8.0* 8.6  MG  --  1.5  --   GLUCOSE 110* 102* 116*    CBG (last 3)   Recent Labs  07/20/14 1741 07/22/14 1253  GLUCAP 104* 144*    Scheduled Meds: . cefTRIAXone (ROCEPHIN)  IV  1 g Intravenous Q24H  . dexamethasone  0.5 mg Oral Daily  . feeding supplement (ENSURE COMPLETE)  237 mL Oral BID BM  . insulin aspart  0-9 Units Subcutaneous TID WC  . [START ON 07/23/2014] rivaroxaban  20 mg Oral Q supper    Continuous Infusions: . sodium chloride 125 mL/hr at 07/22/14 1151    Past Medical History  Diagnosis Date  . Multiple myeloma     thoracic T8 with paraplegia s/p resection- on chemo at visit 10/13/10  . Neurogenic bladder   . Neurogenic bowel   . Partial small bowel obstruction during dec 2011 admission  . Multiple myeloma   . Multiple myeloma without mention of remission   . Cancer   . History of blood transfusion 7/14  . Paraplegia   . Gross hematuria 7/14    post foley cath procedure  . Coagulopathy     on xeralto/ s/p DVT while on coumadin,  IVC in place  . Adrenal insufficiency     on chronic dexamethasone  . Diabetes mellitus without complication   . Anemia     Past Surgical History  Procedure Laterality Date  . Myeloma  thoracic t8 with parpaplegia s/p thoracotomy and thoracic t7-9 cage placement on dec 26th 2011  07/18/10  . Laparotomy  07/20/2011    Procedure: EXPLORATORY LAPAROTOMY;  Surgeon: Judieth Keens, DO;  Location: [place];  Service: General;  Laterality: N/A;  . Colostomy revision  07/20/2011    Procedure: COLON RESECTION SIGMOID;  Surgeon: Judieth Keens, DO;  Location: [place];  Service: General;;  . Colostomy  07/20/2011    Procedure: COLOSTOMY;  Surgeon: Judieth Keens, DO;  Location: [place];  Service: General;;  . Insertion of suprapubic catheter N/A 04/04/2013    Procedure: INSERTION OF SUPRAPUBIC CATHETER;  Surgeon: Alexis Frock, MD;  Location: [facility];  Service: Urology;   Laterality: N/A;  . Cystoscopy N/A 04/04/2013    Procedure: CYSTOSCOPY WITH LITHALOPAXY;  Surgeon: Alexis Frock, MD;  Location: [facility];  Service: Urology;  Laterality: N/A;    Kallie Locks, MS, RD, LDN Pager # [phone] After hours/ weekend pager # [phone]

## 2014-07-23 DIAGNOSIS — N179 Acute kidney failure, unspecified: Secondary | ICD-10-CM

## 2014-07-23 DIAGNOSIS — E2740 Unspecified adrenocortical insufficiency: Secondary | ICD-10-CM

## 2014-07-23 DIAGNOSIS — E119 Type 2 diabetes mellitus without complications: Secondary | ICD-10-CM

## 2014-07-23 LAB — CBC
HCT: 28.4 % — ABNORMAL LOW (ref 39.0–52.0)
Hemoglobin: 9.7 g/dL — ABNORMAL LOW (ref 13.0–17.0)
MCH: 23.5 pg — ABNORMAL LOW (ref 26.0–34.0)
MCHC: 34.2 g/dL (ref 30.0–36.0)
MCV: 68.9 fL — ABNORMAL LOW (ref 78.0–100.0)
Platelets: 435 10*3/uL — ABNORMAL HIGH (ref 150–400)
RBC: 4.12 MIL/uL — ABNORMAL LOW (ref 4.22–5.81)
RDW: 17.3 % — ABNORMAL HIGH (ref 11.5–15.5)
WBC: 9.4 10*3/uL (ref 4.0–10.5)

## 2014-07-23 LAB — BASIC METABOLIC PANEL
Anion gap: 7 (ref 5–15)
BUN: 14 mg/dL (ref 6–23)
CO2: 25 mmol/L (ref 19–32)
Calcium: 8.7 mg/dL (ref 8.4–10.5)
Chloride: 110 mEq/L (ref 96–112)
Creatinine, Ser: 0.84 mg/dL (ref 0.50–1.35)
GFR calc Af Amer: >90 mL/min (ref >90)
GFR calc non Af Amer: >90 mL/min (ref >90)
Glucose, Bld: 93 mg/dL (ref 70–99)
Potassium: 4.2 mmol/L (ref 3.5–5.1)
Sodium: 142 mmol/L (ref 135–145)

## 2014-07-23 LAB — GLUCOSE, CAPILLARY
Glucose-Capillary: 124 mg/dL — ABNORMAL HIGH (ref 70–99)
Glucose-Capillary: 95 mg/dL (ref 70–99)
Glucose-Capillary: 154 mg/dL — ABNORMAL HIGH (ref 70–99)
Glucose-Capillary: 123 mg/dL — ABNORMAL HIGH (ref 70–99)

## 2014-07-23 LAB — HEPARIN LEVEL (UNFRACTIONATED): Heparin Unfractionated: 1.62 IU/mL — ABNORMAL HIGH (ref 0.30–0.70)

## 2014-07-23 MED ORDER — SODIUM CHLORIDE 0.9 % IV SOLN: INTRAVENOUS | Status: DC

## 2014-07-23 MED ORDER — SODIUM CHLORIDE 0.45 % IV SOLN
INTRAVENOUS | Status: DC
  Administered 2014-07-23 – 2014-07-24 (×3): via INTRAVENOUS

## 2014-07-23 NOTE — Discharge Summary (Signed)
Name: [name] MRN: [id number] DOB: [birth year] 58 y.o. PCP: Gary Adie, MD  Date of Admission: 07/20/2014  4:11 PM Date of Discharge: 07/24/2014 Attending Physician: Gary Falcon, MD  Discharge Diagnosis: 1.  Active Problems:   Paraplegia   Neurogenic bladder   Multiple myeloma   Neurogenic bowel   Acute kidney failure   Abdominal pain   AKI (acute kidney injury)   Obstructed suprapubic catheter   Bilateral hydronephrosis  Discharge Medications:   Medication List    TAKE these medications        acetaminophen 325 MG tablet  Commonly known as:  TYLENOL  Take 325-650 mg by mouth every 6 (six) hours as needed. For pain.     cholecalciferol 1000 UNITS tablet  Commonly known as:  VITAMIN D  Take 1,000 Units by mouth daily.     ciprofloxacin 500 MG tablet  Commonly known as:  CIPRO  Take 1 tablet (500 mg total) by mouth 2 (two) times daily.     dexamethasone 1 MG tablet  Commonly known as:  DECADRON  Take 1 mg by mouth daily with breakfast.     latanoprost 0.005 % ophthalmic solution  Commonly known as:  XALATAN  Place 1 drop into both eyes at bedtime.     metFORMIN 500 MG tablet  Commonly known as:  GLUCOPHAGE  Take 500 mg by mouth 2 (two) times daily with a meal.     methocarbamol 500 MG tablet  Commonly known as:  ROBAXIN  Take 500 mg by mouth every 6 (six) hours as needed. For muscle spasms.     XARELTO 20 MG Tabs tablet  Generic drug:  rivaroxaban  Take 20 mg by mouth daily with supper.        Disposition and follow-up:   [name] was discharged from [name] in Good condition.  At the hospital follow up visit please address:  1.  Ensure drinking lots of fluids and completed Cipro for his UTI.  2.  Labs / imaging needed at time of follow-up: None  3.  Pending labs/ test needing follow-up: None  Follow-up Appointments:   Discharge Instructions: Discharge Instructions    Diet - low sodium heart  healthy    Complete by:  As directed      Discharge instructions    Complete by:  As directed   We are prescribing an antibiotic for you today called Ciprofloxacin. Take it today and for 3 more days. Take it 2 times a day.   Continue all your other medication as prescribed. Please also increase your fluid intake.   Follow up with [name] next week.     Increase activity slowly    Complete by:  As directed            Consultations:    Procedures Performed:  Ct Abdomen Pelvis Wo Contrast  07/21/2014   ADDENDUM REPORT: 07/21/2014 07:43  ADDENDUM: These results were called by telephone at the time of interpretation on 07/21/2014 at 7:42 am to Gary. Osa Howell , who verbally acknowledged these results.   Electronically Signed   By: [name] M.D.   On: 07/21/2014 07:43   07/21/2014   CLINICAL DATA:  Generalized abdominal pain and distention.  EXAM: CT ABDOMEN AND PELVIS WITHOUT CONTRAST  TECHNIQUE: Multidetector CT imaging of the abdomen and pelvis was performed following the standard protocol without IV contrast. Oral contrast was administered.  COMPARISON:  June 24, 2012  FINDINGS: There is patchy infiltrate in the left base. There is atelectatic change in the right base. There is evidence of old rib trauma on the left. There are scattered foci of coronary artery calcification.  There is fatty infiltration near the fissure for the ligamentum teres. No focal liver lesions are identified on this noncontrast enhanced study. There is cholelithiasis. The gallbladder wall does not appear thickened. There is no appreciable biliary duct dilatation.  Spleen, pancreas, and adrenals appear normal.  There is severe hydronephrosis bilaterally. There is no renal mass or calculus on either side. There is no ureteral calculus.  There is a filter in the inferior vena cava with the apex directed superiorly. There is no fluid surrounding the inferior vena cava. There is atherosclerotic change in  aorta without aneurysm.  In the pelvis, the urinary bladder is markedly distended and most likely the cause of the hydronephrosis. A suprapubic catheter is positioned within the bladder. There is no pelvic mass or pelvic fluid collection. The prostate has a somewhat inhomogeneous appearance. There is fat in each inguinal ring.  The appendix appears normal. There is no appreciable bowel obstruction. There is a left lower quadrant ostomy with bowel herniating into the ostomy. No bowel compromise is seen. There is no free air or portal venous air.  There is no appreciable ascites, adenopathy, or abscess in the abdomen or pelvis. There is scarring in the anterior abdominal wall consistent with previous surgery. There is postoperative change in the lower thoracic region. There is degenerative change in the lumbar spine. There are no blastic or lytic bone lesions. There are multiple foci of calcification in each hip joint. There is stable anterior wedging of the T12 vertebral body.  IMPRESSION: Markedly enlarged urinary bladder. Suprapubic catheter is present. Question nonfunctioning suprapubic catheter.  Severe hydronephrosis bilaterally, likely due to stasis phenomenon from the marked urinary bladder distention.  Left lower quadrant ostomy. Bowel herniates into this ostomy, but there is no bowel compromise.  Filter in inferior vena cava.  Patchy infiltrate left base.  Atelectasis right base.  Stable wedging of the T12 vertebral body. Postoperative change in the lower thoracic spine.  Appendix appears normal.  Somewhat inhomogeneous appearing prostate. This finding may warrant PSA to further assess.  Extensive calcification each hip joint.  Electronically Signed: By: [name] M.D. 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 Patient was transferred to [facility] from [place]. Patient states yesterday while at church he became nauseous and vomited x 1. He states later that evening he noticed his urine catheter was clogged and that it looked cloudy. He states he drank a lot of water and noticed his urine bag was not filling up like it usually does. He describes abdominal pain that is diffuse, "spastic" with a sharp pain, and "feels tight." He also reports feeling sweaty and clammy. This morning, he reports more nausea and vomiting x 2, and that he threw up all the water he drank the evening prior. He states his wife noticed that he had stool coming from his rectum this morning. He reports this last happened last Wednesday. He does not know if the stool was loose, dark, or bloody. He does  note increased gas in his colostomy bag. He reports lightheadedness and decreased appetite, but denies fevers and chills.  Hospital Course by problem list: Active Problems:   Paraplegia   Neurogenic bladder   Multiple myeloma   Neurogenic bowel   Acute kidney failure   Abdominal pain   AKI (acute kidney injury)   Obstructed suprapubic catheter   Bilateral hydronephrosis   Acute Kidney Failure 2/2 Obstructed Suprapubic Catheter- Resolved: Patient initially presented with abdominal pain, nausea/vomiting, and decreased urine output from his suprapubic catheter. He was found to have an AKI with Cr 2.24, with his baseline 0.8 and a leukocytosis of 21k. A PVR was done which showed 87 mls. A CT Abd/Pelvis was done the following morning and patient was found to have bilateral hydonephrosis and a markedly distended bladder. His Cr became elevated to 3.34. Urology was consulted and they changed out the suprapubic catheter on 12/29. Patient's bladder was drained of ~ 1.4 L. His I&Os continued to be adequate and he was  monitored for post-obstructive diuresis. His water losses were replaced with IVF. His Cr then trended back down to 1.2 and then his baseline of 0.8 by time of discharge. A urine culture was obtained and grew gram negative rods. Patient was started on Rocephin initially (12/29-12/31) and then transitioned to Cipro 500 mg PO BID to be continued for an additional 4 days upon discharge (end date 07/27/14). His leukocytosis continued to trend down from 21>18>9. He remained afebrile throughout his hospitalization and continued to do well after his suprapubic catheter was replaced. Patient is to follow up with his PCP Gary. Bradd Burner at [facility].   Stool Per Rectum in Setting of End-colostomy: Patient having intermittent stool per rectum for last month. I spoke with radiology and reviewed the CT Abd/Pelvis done on 12/29. Radiology believes there could be a colorectal fistula present to explain patient's stool per rectum, but this connection is difficult to visualize. Radiology recommended doing a water enema is see if stool was obtained, but states this isn't necessary if it will not change management and patient asymptomatic. Patient denies any abdominal pain and there is no evidence of peritonitis. WBC count trending down since admission, 21>18>9 today. General surgery was consulted and I agree that observation and personal hygiene are likely the only things needed at this point. This can be continued to be monitored as outpatient.   Adrenal Insufficiency: Patient is on chronic dexamethasone. He was continued on his home Dexamethasone 1 mg daily during his hospitalization.   Hx Bilateral DVTs with IVC Filter: Patient also on Xarelto at home. Xarelto was held on 12/29 due to his AKI and Cr elevation to 3.34. He was bridged with Lovenox. His Xarelto was then resumed on 12/30.   Type 2 DM: No HbA1c was found on EPIC records. He takes Metformin 500 mg BID at home. His Metformin was held during his hospitalization and he was  placed on a sensitive ISS. He was resumed on his home Metformin upon discharge.   Discharge Vitals:   BP 120/68 mmHg  Pulse 75  Temp(Src) 98 F (36.7 C) (Oral)  Resp 18  Ht 5' 8"  (1.727 m)  Wt 114 kg (251 lb 5.2 oz)  BMI 38.22 kg/m2  SpO2 100% Physical Exam General: alert, pleasant, sitting up in bed eating breakfast, NAD HEENT: [place]/AT, EOMI, mucus membranes moist  CV: RRR, normal S1/S2, no m/g/r Pulm: CTA bilaterally, breaths non-labored Abd: BS+, soft, non-tender, non-distended Ext: warm, no edema, moves all Neuro: alert  and oriented x 3, CNs II-XII intact  Discharge Labs:  Results for orders placed or performed during the hospital encounter of 07/20/14 (from the past 24 hour(s))  Glucose, capillary     Status: Abnormal   Collection Time: 07/23/14  4:50 PM  Result Value Ref Range   Glucose-Capillary 154 (H) 70 - 99 mg/dL  Glucose, capillary     Status: Abnormal   Collection Time: 07/23/14  8:59 PM  Result Value Ref Range   Glucose-Capillary 123 (H) 70 - 99 mg/dL  Heparin level (unfractionated)     Status: Abnormal   Collection Time: 07/24/14  6:19 AM  Result Value Ref Range   Heparin Unfractionated >2.20 (H) 0.30 - 0.70 IU/mL  Basic metabolic panel     Status: None   Collection Time: 07/24/14  6:19 AM  Result Value Ref Range   Sodium 141 135 - 145 mmol/L   Potassium 3.8 3.5 - 5.1 mmol/L   Chloride 108 96 - 112 mEq/L   CO2 27 19 - 32 mmol/L   Glucose, Bld 91 70 - 99 mg/dL   BUN 10 6 - 23 mg/dL   Creatinine, Ser 0.77 0.50 - 1.35 mg/dL   Calcium 8.8 8.4 - 10.5 mg/dL   GFR calc non Af Amer >90 >90 mL/min   GFR calc Af Amer >90 >90 mL/min   Anion gap 6 5 - 15  Glucose, capillary     Status: None   Collection Time: 07/24/14  7:51 AM  Result Value Ref Range   Glucose-Capillary 82 70 - 99 mg/dL  Glucose, capillary     Status: None   Collection Time: 07/24/14 11:34 AM  Result Value Ref Range   Glucose-Capillary 95 70 - 99 mg/dL    Signed: Albin Felling, MD 07/24/2014,  1:55 PM    Services Ordered on Discharge: [facility] for pick up Equipment Ordered on Discharge: None

## 2014-07-23 NOTE — Care Management Note (Addendum)
CARE MANAGEMENT NOTE 07/23/2014  Patient:  [name], [name]   Account Number:  [id number]  Date Initiated:  07/23/2014  Documentation initiated by:  Evonte Prestage  Subjective/Objective Assessment:   CM following for progression and d/c planning.     Action/Plan:   07/23/2014 MD notified of need to expedite d/c so that [facility] can transport and pt can leave with wheelchair . Pt active with [facility] for all care and services will continue.   Anticipated DC Date:  07/23/2014   Anticipated DC Plan:  HOME/SELF CARE         Choice offered to / List presented to:             Status of service:   Medicare Important Message given?  NO (If response is "NO", the following Medicare IM given date fields will be blank) Date Medicare IM given:   Medicare IM given by:   Date Additional Medicare IM given:   Additional Medicare IM given by:    Discharge Disposition:  HOME/SELF CARE  Per UR Regulation:    If discussed at Long Length of Stay Meetings, dates discussed:    Comments:  07/23/2014 Pt active with [facility] for daily care services will continue post d/c. CRoyal RN MPH, case manager, [phone]

## 2014-07-23 NOTE — Consult Note (Signed)
[facility]  Consult Note Reason for Consult:Stool per rectum in patient with colostomy Referring Physician: Dr. Catha Nottingham is an 58 y.o. male.  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 During this admission, underwent replacement of obstructed suprapubic catheter on 07/21/2014. He has been paraplegic (T8 level) since 2011.  Patient is S/P exploratory laparotomy with sigmoid colectomy and end colostomy for perforated colon in December 2012 (Dr. Ignacia Marvel). We have been asked to see this patient as he has reported during his hospital stay that over the past 1 month on an intermittent basis, he has had brown stool from his rectum. Wife has reported to him that this has been in an amount equivalent of small soft bowel movement. Patient has not been troubled by this and reports no issues with blood in stool or pain in rectum. Patient denies bowel movement from rectum during this hospitalization. Endorses normal colostomy output.   Past Medical History  Diagnosis Date  . Multiple myeloma     thoracic T8 with paraplegia s/p resection- on chemo at visit 10/13/10  . Neurogenic bladder   . Neurogenic bowel   . Partial small bowel obstruction during dec 2011 admission  . Multiple myeloma   . Multiple myeloma without mention of remission   . Cancer   . History of blood transfusion 7/14  . Paraplegia   . Gross hematuria 7/14    post foley cath procedure  . Coagulopathy     on xeralto/ s/p DVT while on coumadin,  IVC in place  . Adrenal insufficiency     on chronic dexamethasone  . Diabetes mellitus without complication   . Anemia     Past Surgical History  Procedure Laterality Date  . Myeloma  thoracic t8 with parpaplegia s/p thoracotomy and thoracic t7-9 cage placement on dec 26th 2011  07/18/10  . Laparotomy  07/20/2011    Procedure: EXPLORATORY LAPAROTOMY;  Surgeon: Judieth Keens, DO;  Location: [facility];  Service: General;  Laterality: N/A;  . Colostomy revision  07/20/2011    Procedure: COLON RESECTION SIGMOID;  Surgeon: Judieth Keens, DO;  Location: [facility];  Service: General;;  . Colostomy  07/20/2011    Procedure: COLOSTOMY;  Surgeon: Judieth Keens, DO;  Location: [facility];  Service: General;;  . Insertion of suprapubic catheter N/A 04/04/2013    Procedure: INSERTION OF SUPRAPUBIC CATHETER;  Surgeon: Alexis Frock, MD;  Location: [facility];  Service: Urology;  Laterality: N/A;  . Cystoscopy N/A 04/04/2013    Procedure: CYSTOSCOPY WITH LITHALOPAXY;  Surgeon: Alexis Frock, MD;  Location: [facility];  Service: Urology;  Laterality: N/A;    Family History  Problem Relation Age of Onset  . Cancer Mother   . Ovarian cancer Mother   . Diabetes Father     Social History:  reports that he has never smoked. He has never used smokeless tobacco. He reports that he does not drink alcohol or use illicit drugs.  Allergies: No Known Allergies  Medications: I have reviewed the patient's current medications.  Results for orders placed or performed during the hospital encounter of 07/20/14 (from the past 48 hour(s))  Urinalysis, Routine w reflex microscopic     Status: Abnormal   Collection Time: 07/21/14 11:23 AM  Result Value Ref Range   Color, Urine YELLOW YELLOW   APPearance TURBID (A) CLEAR   Specific Gravity, Urine 1.013 1.005 - 1.030   pH 8.0 5.0 - 8.0   Glucose, UA NEGATIVE NEGATIVE mg/dL   Hgb urine dipstick LARGE (A) NEGATIVE   Bilirubin Urine NEGATIVE NEGATIVE   Ketones, ur NEGATIVE NEGATIVE mg/dL   Protein, ur 100 (A) NEGATIVE mg/dL   Urobilinogen, UA 0.2 0.0 - 1.0 mg/dL   Nitrite NEGATIVE NEGATIVE   Leukocytes, UA LARGE (A) NEGATIVE  Culture, Urine     Status:  None (Preliminary result)   Collection Time: 07/21/14 11:23 AM  Result Value Ref Range   Specimen Description URINE, CATHETERIZED    Special Requests NONE    Colony Count      >=100,000 COLONIES/ML Performed at [place] Performed at [facility]    Report Status PENDING   Urine microscopic-add on     Status: Abnormal   Collection Time: 07/21/14 11:23 AM  Result Value Ref Range   Squamous Epithelial / LPF RARE RARE   WBC, UA TOO NUMEROUS TO COUNT <3 WBC/hpf   RBC / HPF 11-20 <3 RBC/hpf   Bacteria, UA MANY (A) RARE  Heparin level (unfractionated)     Status: Abnormal   Collection Time: 07/21/14  6:46 PM  Result Value Ref Range   Heparin Unfractionated >2.20 (H) 0.30 - 0.70 IU/mL    Comment: RESULTS CONFIRMED BY MANUAL DILUTION        IF HEPARIN RESULTS ARE BELOW EXPECTED VALUES, AND PATIENT DOSAGE HAS BEEN CONFIRMED, SUGGEST FOLLOW UP TESTING OF ANTITHROMBIN III LEVELS.   APTT     Status: Abnormal   Collection Time: 07/21/14  6:46 PM  Result Value Ref Range   aPTT 71 (H) 24 - 37 seconds    Comment:        IF BASELINE aPTT IS ELEVATED, SUGGEST PATIENT RISK ASSESSMENT BE USED TO DETERMINE APPROPRIATE ANTICOAGULANT THERAPY.   APTT     Status: Abnormal   Collection Time: 07/22/14  5:20 AM  Result Value Ref Range   aPTT 57 (H) 24 - 37 seconds    Comment:        IF BASELINE aPTT IS ELEVATED, SUGGEST PATIENT RISK ASSESSMENT BE USED TO DETERMINE APPROPRIATE ANTICOAGULANT THERAPY.   Basic metabolic panel     Status: Abnormal   Collection Time: 07/22/14  5:20 AM  Result Value Ref Range   Sodium 138 135 - 145 mmol/L    Comment: Please note change in reference range.   Potassium 3.9 3.5 - 5.1 mmol/L    Comment: Please note change in reference range.   Chloride 106 96 - 112 mEq/L   CO2 22 19 - 32 mmol/L   Glucose, Bld 116 (H) 70 - 99 mg/dL   BUN 29 (H) 6 - 23 mg/dL   Creatinine, Ser 1.20 0.50 - 1.35 mg/dL     Comment: RESULT REPEATED AND VERIFIED   Calcium 8.6 8.4 - 10.5 mg/dL   GFR calc non Af Amer 64 (L) >90 mL/min   GFR calc Af Amer 74 (L) >90 mL/min    Comment: (NOTE) The eGFR has been calculated using the CKD EPI equation. This calculation has not been validated in all clinical situations. eGFR's persistently <90 mL/min signify possible Chronic Kidney Disease.    Anion gap 10 5 - 15  CBC     Status: Abnormal  Collection Time: 07/22/14  5:20 AM  Result Value Ref Range   WBC 18.2 (H) 4.0 - 10.5 K/uL   RBC 4.35 4.22 - 5.81 MIL/uL   Hemoglobin 10.2 (L) 13.0 - 17.0 g/dL   HCT 29.9 (L) 39.0 - 52.0 %   MCV 68.7 (L) 78.0 - 100.0 fL   MCH 23.4 (L) 26.0 - 34.0 pg   MCHC 34.1 30.0 - 36.0 g/dL   RDW 17.2 (H) 11.5 - 15.5 %   Platelets 389 150 - 400 K/uL  Heparin level (unfractionated)     Status: Abnormal   Collection Time: 07/22/14  5:20 AM  Result Value Ref Range   Heparin Unfractionated >2.20 (H) 0.30 - 0.70 IU/mL    Comment:        IF HEPARIN RESULTS ARE BELOW EXPECTED VALUES, AND PATIENT DOSAGE HAS BEEN CONFIRMED, SUGGEST FOLLOW UP TESTING OF ANTITHROMBIN III LEVELS. RESULTS CONFIRMED BY MANUAL DILUTION REPEATED TO VERIFY   Hemoglobin A1c     Status: Abnormal   Collection Time: 07/22/14  5:20 AM  Result Value Ref Range   Hgb A1c MFr Bld 6.0 (H) <5.7 %    Comment: (NOTE)                                                                       According to the ADA Clinical Practice Recommendations for 2011, when HbA1c is used as a screening test:  >=6.5%   Diagnostic of Diabetes Mellitus           (if abnormal result is confirmed) 5.7-6.4%   Increased risk of developing Diabetes Mellitus References:Diagnosis and Classification of Diabetes Mellitus,Diabetes MGNO,0370,48(GQBVQ 1):S62-S69 and Standards of Medical Care in         Diabetes - 2011,Diabetes Care,2011,34 (Suppl 1):S11-S61.    Mean Plasma Glucose 126 (H) <117 mg/dL    Comment: Performed at [facility]    Glucose, capillary     Status: Abnormal   Collection Time: 07/22/14 12:53 PM  Result Value Ref Range   Glucose-Capillary 144 (H) 70 - 99 mg/dL  Glucose, capillary     Status: Abnormal   Collection Time: 07/22/14  5:06 PM  Result Value Ref Range   Glucose-Capillary 128 (H) 70 - 99 mg/dL  Glucose, capillary     Status: Abnormal   Collection Time: 07/22/14  9:26 PM  Result Value Ref Range   Glucose-Capillary 120 (H) 70 - 99 mg/dL   Comment 1 Notify RN   Heparin level (unfractionated)     Status: Abnormal   Collection Time: 07/23/14  5:44 AM  Result Value Ref Range   Heparin Unfractionated 1.62 (H) 0.30 - 0.70 IU/mL    Comment: RESULTS CONFIRMED BY MANUAL DILUTION        IF HEPARIN RESULTS ARE BELOW EXPECTED VALUES, AND PATIENT DOSAGE HAS BEEN CONFIRMED, SUGGEST FOLLOW UP TESTING OF ANTITHROMBIN III LEVELS.   CBC     Status: Abnormal   Collection Time: 07/23/14  5:44 AM  Result Value Ref Range   WBC 9.4 4.0 - 10.5 K/uL   RBC 4.12 (L) 4.22 - 5.81 MIL/uL   Hemoglobin 9.7 (L) 13.0 - 17.0 g/dL   HCT 28.4 (L) 39.0 - 52.0 %   MCV 68.9 (L) 78.0 -  100.0 fL   MCH 23.5 (L) 26.0 - 34.0 pg   MCHC 34.2 30.0 - 36.0 g/dL   RDW 17.3 (H) 11.5 - 15.5 %   Platelets 435 (H) 150 - 400 K/uL  Glucose, capillary     Status: Abnormal   Collection Time: 07/23/14  8:53 AM  Result Value Ref Range   Glucose-Capillary 124 (H) 70 - 99 mg/dL    No results found.  Review of Systems  Constitutional: Negative.   Respiratory: Negative.   Cardiovascular: Negative for chest pain.  Gastrointestinal: Positive for nausea, vomiting and abdominal pain.  Musculoskeletal: Negative.    Blood pressure 112/64, pulse 95, temperature 99.1 F (37.3 C), temperature source Oral, resp. rate 21, height 5' 8"  (1.727 m), weight 114 kg (251 lb 5.2 oz), SpO2 95 %. Physical Exam  Nursing note and vitals reviewed. Constitutional: He is oriented to person, place, and time. Vital signs are normal. He appears well-developed  and well-nourished. He is cooperative.  Non-toxic appearance. He does not have a sickly appearance. He does not appear ill. No distress.  HENT:  Head: Normocephalic and atraumatic.  Right Ear: Hearing and external ear normal.  Left Ear: Hearing and external ear normal.  Nose: Nose normal.  Mouth/Throat: Oropharynx is clear and moist.  Eyes: Conjunctivae are normal. No scleral icterus.  Cardiovascular: Normal rate, regular rhythm and normal heart sounds.   Respiratory: Effort normal and breath sounds normal.  GI: Soft. Normal appearance and bowel sounds are normal. He exhibits no distension. There is no tenderness. There is no rebound and no guarding.    Normal light brown liquid stool output in colostomy bag. Ostomy appears healthy.  No signs of peritonitis  Genitourinary: Rectal exam shows anal tone abnormal. Rectal exam shows no external hemorrhoid, no internal hemorrhoid, no fissure, no mass and no tenderness. Circumcised. Discharge found.  +yellow purulent discharge from meatus +Diminished anal tone Light brown, soft mucoid material in rectal vault  Neurological: He is alert and oriented to person, place, and time.  Paraplegia at T8 level  Skin: Skin is warm and dry.  Psychiatric: He has a normal mood and affect. His behavior is normal.    Assessment: 58 y/o male s/p remote end colostomy (2012) now with brown stool-like material output from rectum.  Active Problems:  Paraplegia  Neurogenic bladder  Multiple myeloma  Neurogenic bowel  Acute kidney failure  Abdominal pain  AKI (acute kidney injury)  Obstructed suprapubic catheter  Bilateral hydronephrosis  Plan: Recent Ct A/P imaging (07/21/2014) does not describe or identify colorectal fistula, however, given above clinical history and physical findings, this is a possible explanation for recent rectal output. Question then becomes whether or not this requires surgical intervention. Nothing to suggest  intraabdominal leakage of stool. No peritonitis. As patient is not bothered by this development, may simply require observation and additional personal hygiene at home. As patient is paraplegic, if leakage of stool from rectum begins to contribute to skin breakdown in area of buttocks/rectum/anus, may need to consider surgical intervention at that point. Will discuss above with Dr. Grandville Silos today. Thank you for consultation.      Lahoma Rocker, [facility] Surgery Pager [phone] 07/23/2014, 10:03 AM

## 2014-07-23 NOTE — Progress Notes (Addendum)
Internal Medicine Attending  Date: 07/23/2014  Patient name: [name] Medical record number: [id number] Date of birth: 1955-09-09 Age: 58 y.o. Gender: male  I saw and evaluated the patient and discussed his care with resident Dr. Arcelia Jew.  He is afebrile and without complaint.  His  urine output has slowed today.  General surgery did not feel that a colorectal fistula is likely, or that further evaluation was needed of the brown material he has noted per rectum; surgeon suspects that this is just mucus and sloughing of cells.  His renal function and electrolytes have completely normalized.  He is stable for discharge home today on an oral antibiotic with outpatient follow-up next week; will need to follow his urine culture to confirm sensitivity of the gram-negative rod.    Addendum: Patient has arranged transportation home for tomorrow, so plan will be discharge home tomorrow if he continues to do well.  Dr. Daryll Drown will take over as attending physician tomorrow 07/24/2014.

## 2014-07-23 NOTE — Progress Notes (Signed)
Subjective: Patient has no complaints this morning. He states his abdominal pain, nausea, and vomiting have completely resolved. UOP 2.8 L in last 24 hours. I spoke with patient's PCP Dr. Bradd Burner yesterday evening about patient's issue of having stool from his rectum intermittently over the last month in the setting of an end-colostomy. I then spoke with radiology this morning and we reviewed the CT Abd/Pelvis from 12/29. Radiology believes there may be a fistula present from the bowel going to the end-colostomy to the rectum, but it is difficult to visualize. This possible colorectal fistula was not present on CT Abd/pelvis in 2013. Will consult general surgery for further input. Patient states that the stool from his rectum does not bother him as it occurs only sparingly and he does not have any pain.   Objective: Vital signs in last 24 hours: Filed Vitals:   07/22/14 1708 07/22/14 2130 07/23/14 0500 07/23/14 0840  BP: 120/60 105/59 116/66 112/64  Pulse: 71 65 54 95  Temp: 98.1 F (36.7 C) 98.4 F (36.9 C) 98 F (36.7 C) 99.1 F (37.3 C)  TempSrc: Oral Oral Oral Oral  Resp: 18 18 20 21   Height:      Weight:  114 kg (251 lb 5.2 oz)    SpO2: 100% 96% 99% 95%   Weight change:   Intake/Output Summary (Last 24 hours) at 07/23/14 1112 Last data filed at 07/23/14 0900  Gross per 24 hour  Intake  757.5 ml  Output   1600 ml  Net -842.5 ml   Physical Exam General: alert, pleasant, sitting up in bed, NAD HEENT: [place]/AT, EOMI, mucus membranes moist  CV: RRR, normal S1/S2, no m/g/r Pulm: CTA bilaterally, breaths non-labored Abd: BS+, soft, non-tender, non-distended Ext: warm, no edema, moves all Neuro: alert and oriented x 3, CNs II-XII intact  Lab Results: Basic Metabolic Panel:  Recent Labs Lab 07/21/14 0556 07/22/14 0520 07/23/14 0544  NA 134* 138 142  K 4.7 3.9 4.2  CL 102 106 110  CO2 17* 22 25  GLUCOSE 102* 116* 93  BUN 42* 29* PENDING  CREATININE 3.32* 1.20 0.84    CALCIUM 8.0* 8.6 8.7  MG 1.5  --   --    Liver Function Tests:  Recent Labs Lab 07/20/14 2004 07/21/14 0556  AST 16 17  ALT 18 17  ALKPHOS 66 78  BILITOT 1.0 1.3*  PROT 7.7 7.8  ALBUMIN 3.2* 3.0*    Recent Labs Lab 07/20/14 2004  LIPASE 25   CBC:  Recent Labs Lab 07/21/14 0556 07/22/14 0520 07/23/14 0544  WBC 21.4* 18.2* 9.4  NEUTROABS 18.6*  --   --   HGB 11.6* 10.2* 9.7*  HCT 33.5* 29.9* 28.4*  MCV 66.9* 68.7* 68.9*  PLT 452* 389 435*   CBG:  Recent Labs Lab 07/20/14 1741 07/22/14 1253 07/22/14 1706 07/22/14 2126 07/23/14 0853  GLUCAP 104* 144* 128* 120* 124*   Hemoglobin A1C:  Recent Labs Lab 07/22/14 0520  HGBA1C 6.0*   Urinalysis:  Recent Labs Lab 07/21/14 1123  COLORURINE YELLOW  LABSPEC 1.013  PHURINE 8.0  GLUCOSEU NEGATIVE  HGBUR LARGE*  BILIRUBINUR NEGATIVE  KETONESUR NEGATIVE  PROTEINUR 100*  UROBILINOGEN 0.2  NITRITE NEGATIVE  LEUKOCYTESUR LARGE*    Micro Results: Recent Results (from the past 240 hour(s))  Culture, Urine     Status: None (Preliminary result)   Collection Time: 07/21/14 11:23 AM  Result Value Ref Range Status   Specimen Description URINE, CATHETERIZED  Final  Special Requests NONE  Final   Colony Count   Final    >=100,000 COLONIES/ML Performed at [facility]    Culture   Final    [place] Performed at [facility]    Report Status PENDING  Incomplete   Studies/Results: No results found. Medications: I have reviewed the patient's current medications. Scheduled Meds: . cefTRIAXone (ROCEPHIN)  IV  1 g Intravenous Q24H  . dexamethasone  0.5 mg Oral Daily  . feeding supplement (GLUCERNA SHAKE)  237 mL Oral BID BM  . insulin aspart  0-9 Units Subcutaneous TID WC  . rivaroxaban  20 mg Oral Q supper   Continuous Infusions: . sodium chloride     PRN Meds:.methocarbamol, ondansetron **OR** ondansetron (ZOFRAN) IV Assessment/Plan: Active Problems:   Paraplegia    Neurogenic bladder   Multiple myeloma   Neurogenic bowel   Acute kidney failure   Abdominal pain   AKI (acute kidney injury)   Obstructed suprapubic catheter   Bilateral hydronephrosis  Acute Kidney Failure: Cr now improved to 0.84 this morning. Patient also notes his complete resolution of his abdominal pain, nausea, and vomiting. Most likely his pain was related to his urinary obstruction and distended bladder. A new suprapubic catheter was replaced on 12/29. His UOP continues to be good, with 2.8 L over the last 24 hours. He is on IVF @ 125 ml/hr. BP stable in 081K-481E systolic. WBC count trending down from 21>18>9. Leukocytosis possibly from UTI since he was oliguric before catheter was replaced.  - Continue IVF @ 125 ml/hr for 24 hours (equals 2.5 L) - Strict I&Os - Urine culture growing gram neg rods - Continue Rocephin, can transition to PO antibiotics   Stool Per Rectum in Setting of End-Colostomy: Patient having intermittent stool per rectum for last month. I spoke with radiology and reviewed the CT Abd/Pelvis done on 12/29. Radiology believes there could be a colorectal fistula present to explain patient's stool per rectum, but this connection is difficult to visualize. Radiology recommended doing a water enema is see if stool was obtained, but states this isn't necessary if it will not change management and patient asymptomatic. Patient denies any abdominal pain and there is no evidence of peritonitis. WBC count trending down since admission, 21>18>9 today. General surgery was consulted and I agree that observation and personal hygiene are likely the only things needed at this point. This can be continued to be monitored as outpatient.  - General surgery consulted, appreciate recommendations   Adrenal Insufficiency: On chronic dexamethasone. He takes dexamethasone 1 mg daily at home. - Continue home Dexamethasone  Hx Bilateral DVTs with IVC Filter: Also on Xarelto at home.  -  Xarelto 20 mg daily    Type 2 DM: No HbA1c on record. Patient takes Metformin 500 mg BID at home.  - Hold Metformin - Sensitive ISS  Diet: Regular  VTE PPx: Xarelto Dispo: Disposition is deferred at this time, awaiting improvement of current medical problems. Anticipated discharge later today or tomorrow.   The patient does have a current PCP [name], MD) and does need an [facility] hospital follow-up appointment after discharge.  The patient does not have transportation limitations that hinder transportation to clinic appointments.  .Services Needed at time of discharge: Y = Yes, Blank = No PT:   OT:   RN:   Equipment:   Other:     LOS: 3 days   Albin Felling, MD 07/23/2014, 11:12 AM

## 2014-07-24 LAB — BASIC METABOLIC PANEL
Anion gap: 6 (ref 5–15)
BUN: 10 mg/dL (ref 6–23)
CO2: 27 mmol/L (ref 19–32)
Calcium: 8.8 mg/dL (ref 8.4–10.5)
Chloride: 108 mEq/L (ref 96–112)
Creatinine, Ser: 0.77 mg/dL (ref 0.50–1.35)
GFR calc Af Amer: >90 mL/min (ref >90)
GFR calc non Af Amer: >90 mL/min (ref >90)
Glucose, Bld: 91 mg/dL (ref 70–99)
Potassium: 3.8 mmol/L (ref 3.5–5.1)
Sodium: 141 mmol/L (ref 135–145)

## 2014-07-24 LAB — GLUCOSE, CAPILLARY
Glucose-Capillary: 82 mg/dL (ref 70–99)
Glucose-Capillary: 95 mg/dL (ref 70–99)

## 2014-07-24 LAB — HEPARIN LEVEL (UNFRACTIONATED): Heparin Unfractionated: >2.2 IU/mL — ABNORMAL HIGH (ref 0.30–0.70)

## 2014-07-24 MED ORDER — CIPROFLOXACIN HCL 500 MG PO TABS
500.0000 mg | ORAL_TABLET | Freq: Two times a day (BID) | ORAL | Status: DC
  Administered 2014-07-24: 500 mg via ORAL
  Filled 2014-07-24 (×3): qty 1

## 2014-07-24 MED ORDER — CIPROFLOXACIN HCL 500 MG PO TABS: 500.0000 mg | ORAL_TABLET | Freq: Two times a day (BID) | ORAL | Status: DC

## 2014-07-24 NOTE — Clinical Social Work Note (Signed)
CSW received consult for [facility] transportation home. RN confirmed home address with patient (CSW witnessed)-address listed on the facesheet is correct.  [facility] to be called at 2:30pm.  CSW will complete form/packet and place on chart.  Nonnie Done, [place] [phone]  Psychiatric & Orthopedics (5N 1-16) Clinical Social Worker

## 2014-07-24 NOTE — Progress Notes (Signed)
Pt discharge instructions given, pt verbalized understanding.  VSS. Denies pain. Social Worker to [employer] for transportation home.

## 2014-07-24 NOTE — Progress Notes (Signed)
Subjective: Patient feeling well today, ready to go home. Diuresed 3.9 L over last 24 hours, but has been getting IVF @ 125 ml/hr for last 24 hours.   Objective: Vital signs in last 24 hours: Filed Vitals:   07/23/14 0840 07/23/14 1811 07/23/14 2105 07/24/14 0610  BP: 112/64 134/76 120/66 133/76  Pulse: 95 56 55 56  Temp: 99.1 F (37.3 C) 98.6 F (37 C) 98.6 F (37 C) 97.8 F (36.6 C)  TempSrc: Oral Oral Oral Oral  Resp: 21 22 20 18   Height:      Weight:      SpO2: 95% 98% 99% 100%   Weight change:   Intake/Output Summary (Last 24 hours) at 07/24/14 0738 Last data filed at 07/24/14 0612  Gross per 24 hour  Intake    600 ml  Output   3900 ml  Net  -3300 ml   Physical Exam General: alert, pleasant, sitting up in bed eating breakfast, NAD HEENT: [place]/AT, EOMI, mucus membranes moist  CV: RRR, normal S1/S2, no m/g/r Pulm: CTA bilaterally, breaths non-labored Abd: BS+, soft, non-tender, non-distended Ext: warm, no edema, moves all Neuro: alert and oriented x 3, CNs II-XII intact  Lab Results: Basic Metabolic Panel:  Recent Labs Lab 07/21/14 0556 07/22/14 0520 07/23/14 0544  NA 134* 138 142  K 4.7 3.9 4.2  CL 102 106 110  CO2 17* 22 25  GLUCOSE 102* 116* 93  BUN 42* 29* 14  CREATININE 3.32* 1.20 0.84  CALCIUM 8.0* 8.6 8.7  MG 1.5  --   --    Liver Function Tests:  Recent Labs Lab 07/20/14 2004 07/21/14 0556  AST 16 17  ALT 18 17  ALKPHOS 66 78  BILITOT 1.0 1.3*  PROT 7.7 7.8  ALBUMIN 3.2* 3.0*    Recent Labs Lab 07/20/14 2004  LIPASE 25   CBC:  Recent Labs Lab 07/21/14 0556 07/22/14 0520 07/23/14 0544  WBC 21.4* 18.2* 9.4  NEUTROABS 18.6*  --   --   HGB 11.6* 10.2* 9.7*  HCT 33.5* 29.9* 28.4*  MCV 66.9* 68.7* 68.9*  PLT 452* 389 435*   CBG:  Recent Labs Lab 07/22/14 1706 07/22/14 2126 07/23/14 0853 07/23/14 1146 07/23/14 1650 07/23/14 2059  GLUCAP 128* 120* 124* 95 154* 123*   Hemoglobin A1C:  Recent Labs Lab  07/22/14 0520  HGBA1C 6.0*   Urinalysis:  Recent Labs Lab 07/21/14 1123  COLORURINE YELLOW  LABSPEC 1.013  PHURINE 8.0  GLUCOSEU NEGATIVE  HGBUR LARGE*  BILIRUBINUR NEGATIVE  KETONESUR NEGATIVE  PROTEINUR 100*  UROBILINOGEN 0.2  NITRITE NEGATIVE  LEUKOCYTESUR LARGE*    Micro Results: Recent Results (from the past 240 hour(s))  Culture, Urine     Status: None (Preliminary result)   Collection Time: 07/21/14 11:23 AM  Result Value Ref Range Status   Specimen Description URINE, CATHETERIZED  Final   Special Requests NONE  Final   [place] Count   Final    >=100,000 COLONIES/ML Performed at [facility]    Culture   Final    [place] Performed at [facility]    Report Status PENDING  Incomplete   Studies/Results: No results found. Medications: I have reviewed the patient's current medications. Scheduled Meds: . cefTRIAXone (ROCEPHIN)  IV  1 g Intravenous Q24H  . dexamethasone  0.5 mg Oral Daily  . feeding supplement (GLUCERNA SHAKE)  237 mL Oral BID BM  . insulin aspart  0-9 Units Subcutaneous TID WC  .  rivaroxaban  20 mg Oral Q supper   Continuous Infusions: . sodium chloride 100 mL/hr at 07/23/14 2059   PRN Meds:.methocarbamol, ondansetron **OR** ondansetron (ZOFRAN) IV Assessment/Plan: Active Problems:   Paraplegia   Neurogenic bladder   Multiple myeloma   Neurogenic bowel   Acute kidney failure   Abdominal pain   AKI (acute kidney injury)   Obstructed suprapubic catheter   Bilateral hydronephrosis  Acute Kidney Failure 2/2 Obstructed Suprapubic Catheter- Resolved: Cr now back to baseline of 0.8. He has diuresed 3.9 L over last 24 hours, but has been getting IVF. Patient ready for discharge today.  - Can stop IVF - Urine culture growing gram neg rods - Rocephin stopped (start date: 07/21/14-07/23/14) - Transition to Cipro 500 mg BID for 4 days for total of 7 days of antibiotics  Stool Per Rectum in Setting of  End-Colostomy: Patient having intermittent stool per rectum for last month. I spoke with radiology and reviewed the CT Abd/Pelvis done on 12/29. Radiology believes there could be a colorectal fistula present to explain patient's stool per rectum, but this connection is difficult to visualize. Radiology recommended doing a water enema is see if stool was obtained, but states this isn't necessary if it will not change management and patient asymptomatic. Patient denies any abdominal pain and there is no evidence of peritonitis. WBC count trending down since admission, 21>18>9 today. General surgery was consulted and I agree that observation and personal hygiene are likely the only things needed at this point. This can be continued to be monitored as outpatient.   Adrenal Insufficiency: On chronic dexamethasone. He takes dexamethasone 1 mg daily at home. - Continue home Dexamethasone  Hx Bilateral DVTs with IVC Filter: Also on Xarelto at home.  - Xarelto 20 mg daily   Type 2 DM: No HbA1c on record. Patient takes Metformin 500 mg BID at home.  - Hold Metformin - Sensitive ISS  Diet: Regular  VTE PPx: Xarelto Dispo: Discharge today  The patient does have a current PCP [name], MD) and does need an [facility] hospital follow-up appointment after discharge.  The patient does not have transportation limitations that hinder transportation to clinic appointments.  .Services Needed at time of discharge: Y = Yes, Blank = No PT:   OT:   RN:   Equipment:   Other:     LOS: 4 days   Albin Felling, MD 07/24/2014, 7:38 AM

## 2014-07-24 NOTE — Clinical Social Work Psychosocial (Deleted)
Deleted

## 2014-07-26 LAB — URINE CULTURE: Colony Count: >=100000

## 2014-07-27 ENCOUNTER — Telehealth: Payer: Self-pay | Admitting: *Deleted

## 2014-07-27 NOTE — Telephone Encounter (Signed)
Call from hospital Pharmacist, Ysidro Evert  # [phone] Pt was d/c on Friday on Cipro.  He reports culture grew out 2 organisms and one is not sensitive to Cipro. He wanted to make you aware on this. Pt is not Internal Med patient but treated by IM and Dr Arcelia Jew did the d/c summary.  Urine C & S report in EPIC.

## 2014-07-27 NOTE — Telephone Encounter (Signed)
Message sent

## 2014-07-27 NOTE — Telephone Encounter (Signed)
Pls forward message to Dr. Bradd Burner at [facility] who is PCP. Hopefully, he will have seen pt and will know how he currently is doing just on Cipro. Thanks

## 2014-07-29 NOTE — Discharge Summary (Signed)
Name: [name] MRN: [id number] DOB: 10-28-1955 59 y.o. PCP: [name], MD  Date of Admission: 07/20/14 Date of Discharge: 07/24/14 Attending Physician: Dr. Daryll Drown  Discharge Diagnosis:  Active Problems:  Paraplegia  Neurogenic bladder  Multiple myeloma  Neurogenic bowel  Acute kidney failure  Abdominal pain  AKI (acute kidney injury)  Obstructed suprapubic catheter  Bilateral hydronephrosis  Discharge Medications:   Medication List    Notice    This visit has been closed. A record of the med list at the time of the visit is not available.      Disposition and follow-up:   GaryLevonte W Howell was discharged from [facility] in Good condition.  At the hospital follow up visit please address:  1.  Ensure drinking lots of fluids and completed Cipro for his UTI.  2.  Labs / imaging needed at time of follow-up: None  3.  Pending labs/ test needing follow-up: None  Follow-up Appointments:   Discharge Instructions:   Consultations:    Procedures Performed:  Ct Abdomen Pelvis Wo Contrast  07/21/2014   ADDENDUM REPORT: 07/21/2014 07:43  ADDENDUM: These results were called by telephone at the time of interpretation on 07/21/2014 at 7:42 am to Dr. Osa Craver , who verbally acknowledged these results.   Electronically Signed   By: Lowella Grip M.D.   On: 07/21/2014 07:43   07/21/2014   CLINICAL DATA:  Generalized abdominal pain and distention.  EXAM: CT ABDOMEN AND PELVIS WITHOUT CONTRAST  TECHNIQUE: Multidetector CT imaging of the abdomen and pelvis was performed following the standard protocol without IV contrast. Oral contrast was administered.  COMPARISON:  June 24, 2012  FINDINGS: There is patchy infiltrate in the left base. There is atelectatic change in the right base. There is evidence of old rib trauma on the left. There are scattered foci of coronary artery calcification.  There is fatty infiltration near the  fissure for the ligamentum teres. No focal liver lesions are identified on this noncontrast enhanced study. There is cholelithiasis. The gallbladder wall does not appear thickened. There is no appreciable biliary duct dilatation.  Spleen, pancreas, and adrenals appear normal.  There is severe hydronephrosis bilaterally. There is no renal mass or calculus on either side. There is no ureteral calculus.  There is a filter in the inferior vena cava with the apex directed superiorly. There is no fluid surrounding the inferior vena cava. There is atherosclerotic change in aorta without aneurysm.  In the pelvis, the urinary bladder is markedly distended and most likely the cause of the hydronephrosis. A suprapubic catheter is positioned within the bladder. There is no pelvic mass or pelvic fluid collection. The prostate has a somewhat inhomogeneous appearance. There is fat in each inguinal ring.  The appendix appears normal. There is no appreciable bowel obstruction. There is a left lower quadrant ostomy with bowel herniating into the ostomy. No bowel compromise is seen. There is no free air or portal venous air.  There is no appreciable ascites, adenopathy, or abscess in the abdomen or pelvis. There is scarring in the anterior abdominal wall consistent with previous surgery. There is postoperative change in the lower thoracic region. There is degenerative change in the lumbar spine. There are no blastic or lytic bone lesions. There are multiple foci of calcification in each hip joint. There is stable anterior wedging of the T12 vertebral body.  IMPRESSION: Markedly enlarged urinary bladder. Suprapubic catheter is present. Question nonfunctioning suprapubic catheter.  Severe  hydronephrosis bilaterally, likely due to stasis phenomenon from the marked urinary bladder distention.  Left lower quadrant ostomy. Bowel herniates into this ostomy, but there is no bowel compromise.  Filter in inferior vena cava.  Patchy infiltrate  left base.  Atelectasis right base.  Stable wedging of the T12 vertebral body. Postoperative change in the lower thoracic spine.  Appendix appears normal.  Somewhat inhomogeneous appearing prostate. This finding may warrant PSA to further assess.  Extensive calcification each hip joint.  Electronically Signed: By: Lowella Grip M.D. 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 Patient was transferred to [facility] from [place]. Patient states yesterday while at church he became nauseous and vomited x 1. He states later that evening he noticed his urine catheter was clogged and that it looked cloudy. He states he drank a lot of water and noticed his urine bag was not filling up like it usually does. He describes abdominal pain that is diffuse, "spastic" with a sharp pain, and "feels tight." He also reports feeling sweaty and clammy. This morning, he reports more nausea and vomiting x 2, and that he threw up all the water he drank the evening prior. He states his wife noticed that he had stool coming from his rectum this morning. He reports this last happened last Wednesday. He does not know if the stool was loose, dark, or bloody. He does note increased gas in his colostomy bag. He reports lightheadedness and decreased appetite, but denies fevers and chills.  Hospital Course by problem list: Active Problems:   * No active hospital problems. *   Acute Kidney Failure 2/2 Obstructed Suprapubic Catheter- Resolved: Patient initially presented with abdominal pain, nausea/vomiting, and decreased urine output from his suprapubic catheter. He was found to have an AKI with Cr 2.24, with his baseline  0.8 and a leukocytosis of 21k. A PVR was done which showed 87 mls. A CT Abd/Pelvis was done the following morning and patient was found to have bilateral hydonephrosis and a markedly distended bladder. His Cr became elevated to 3.34. Urology was consulted and they changed out the suprapubic catheter on 12/29. Patient's bladder was drained of ~ 1.4 L. His I&Os continued to be adequate and he was monitored for post-obstructive diuresis. His water losses were replaced with IVF. His Cr then trended back down to 1.2 and then his baseline of 0.8 by time of discharge. A urine culture was obtained and grew gram negative rods. Patient was started on Rocephin initially (12/29-12/31) and then transitioned to Cipro 500 mg PO BID to be continued for an additional 4 days upon discharge (end date 07/27/14). His leukocytosis continued to trend down from 21>18>9. He remained afebrile throughout his hospitalization and continued to do well after his suprapubic catheter was replaced. Patient is to follow up with his PCP Dr. Bradd Burner at [facility].   Stool Per Rectum in Setting of End-colostomy: Patient having intermittent stool per rectum for last month. I spoke with radiology and reviewed the CT Abd/Pelvis done on 12/29. Radiology believes there could be a colorectal fistula present to explain patient's stool per rectum, but this connection is difficult to visualize. Radiology recommended doing a water enema is see if stool  was obtained, but states this isn't necessary if it will not change management and patient asymptomatic. Patient denies any abdominal pain and there is no evidence of peritonitis. WBC count trending down since admission, 21>18>9 today. General surgery was consulted and I agree that observation and personal hygiene are likely the only things needed at this point. This can be continued to be monitored as outpatient.   Adrenal Insufficiency: Patient is on chronic dexamethasone. He was continued on his home Dexamethasone  1 mg daily during his hospitalization.   Hx Bilateral DVTs with IVC Filter: Patient also on Xarelto at home. Xarelto was held on 12/29 due to his AKI and Cr elevation to 3.34. He was bridged with Lovenox. His Xarelto was then resumed on 12/30.   Type 2 DM: No HbA1c was found on EPIC records. He takes Metformin 500 mg BID at home. His Metformin was held during his hospitalization and he was placed on a sensitive ISS. He was resumed on his home Metformin upon discharge.   Discharge Vitals:   There were no vitals taken for this visit. Physical Exam General: alert, pleasant, sitting up in bed eating breakfast, NAD HEENT: [place]/AT, EOMI, mucus membranes moist  CV: RRR, normal S1/S2, no m/g/r Pulm: CTA bilaterally, breaths non-labored Abd: BS+, soft, non-tender, non-distended Ext: warm, no edema, moves all Neuro: alert and oriented x 3, CNs II-XII intact  Discharge Labs:  No results found for this or any previous visit (from the past 24 hour(s)).  Signed: Albin Felling, MD 07/29/2014, 8:21 AM    Services Ordered on Discharge: [facility] for pick up Equipment Ordered on Discharge: None  I added discharge diagnoses to Dr. Shela Commons note.  The rest is her note, which I have reviewed. I was present on day of discharge for assistance to the resident team.

## 2014-08-19 NOTE — Telephone Encounter (Signed)
Call placed to Dr Etheleen Sia nurse and she states no treatment was needed.

## 2014-11-24 ENCOUNTER — Telehealth: Payer: Self-pay | Admitting: Hematology and Oncology

## 2014-11-24 ENCOUNTER — Other Ambulatory Visit (HOSPITAL_BASED_OUTPATIENT_CLINIC_OR_DEPARTMENT_OTHER): Payer: Medicare (Managed Care)

## 2014-11-24 ENCOUNTER — Encounter: Payer: Self-pay | Admitting: Hematology and Oncology

## 2014-11-24 ENCOUNTER — Ambulatory Visit (HOSPITAL_BASED_OUTPATIENT_CLINIC_OR_DEPARTMENT_OTHER): Payer: Medicare (Managed Care) | Admitting: Hematology and Oncology

## 2014-11-24 VITALS — BP 137/73 | HR 84 | Temp 97.7°F | Resp 18 | Ht 68.0 in

## 2014-11-24 DIAGNOSIS — D509 Iron deficiency anemia, unspecified: Secondary | ICD-10-CM

## 2014-11-24 DIAGNOSIS — C9000 Multiple myeloma not having achieved remission: Secondary | ICD-10-CM | POA: Diagnosis not present

## 2014-11-24 DIAGNOSIS — R319 Hematuria, unspecified: Secondary | ICD-10-CM | POA: Diagnosis not present

## 2014-11-24 LAB — COMPREHENSIVE METABOLIC PANEL (CC13)
ALT: 19 U/L (ref 0–55)
AST: 20 U/L (ref 5–34)
Albumin: 3.4 g/dL — ABNORMAL LOW (ref 3.5–5.0)
Alkaline Phosphatase: 70 U/L (ref 40–150)
Anion Gap: 12 mEq/L — ABNORMAL HIGH (ref 3–11)
BUN: 10.3 mg/dL (ref 7.0–26.0)
CO2: 21 mEq/L — ABNORMAL LOW (ref 22–29)
Calcium: 9.6 mg/dL (ref 8.4–10.4)
Chloride: 107 mEq/L (ref 98–109)
Creatinine: 0.8 mg/dL (ref 0.7–1.3)
EGFR: >90 mL/min/{1.73_m2} (ref >90)
Glucose: 102 mg/dl (ref 70–140)
Potassium: 3.8 mEq/L (ref 3.5–5.1)
Sodium: 140 mEq/L (ref 136–145)
Total Bilirubin: 0.43 mg/dL (ref 0.20–1.20)
Total Protein: 7.7 g/dL (ref 6.4–8.3)

## 2014-11-24 LAB — CBC & DIFF AND RETIC
BASO%: 0.3 % (ref 0.0–2.0)
Basophils Absolute: 0 10*3/uL (ref 0.0–0.1)
EOS%: 5.4 % (ref 0.0–7.0)
Eosinophils Absolute: 0.6 10*3/uL — ABNORMAL HIGH (ref 0.0–0.5)
HCT: 35.5 % — ABNORMAL LOW (ref 38.4–49.9)
HGB: 12.3 g/dL — ABNORMAL LOW (ref 13.0–17.1)
Immature Retic Fract: 8.9 % (ref 3.00–10.60)
LYMPH%: 16 % (ref 14.0–49.0)
MCH: 24.6 pg — ABNORMAL LOW (ref 27.2–33.4)
MCHC: 34.6 g/dL (ref 32.0–36.0)
MCV: 71.1 fL — ABNORMAL LOW (ref 79.3–98.0)
MONO#: 0.5 10*3/uL (ref 0.1–0.9)
MONO%: 4.4 % (ref 0.0–14.0)
NEUT#: 8.2 10*3/uL — ABNORMAL HIGH (ref 1.5–6.5)
NEUT%: 73.9 % (ref 39.0–75.0)
Platelets: 438 10*3/uL — ABNORMAL HIGH (ref 140–400)
RBC: 4.99 10*6/uL (ref 4.20–5.82)
RDW: 17 % — ABNORMAL HIGH (ref 11.0–14.6)
Retic %: 0.9 % (ref 0.80–1.80)
Retic Ct Abs: 44.91 10*3/uL (ref 34.80–93.90)
WBC: 11.1 10*3/uL — ABNORMAL HIGH (ref 4.0–10.3)
lymph#: 1.8 10*3/uL (ref 0.9–3.3)

## 2014-11-24 LAB — FERRITIN CHCC: Ferritin: 144 ng/ml (ref 22–316)

## 2014-11-24 LAB — LACTATE DEHYDROGENASE (CC13): LDH: 127 U/L (ref 125–245)

## 2014-11-24 NOTE — Telephone Encounter (Signed)
Patient sent back to lab and given appointments for November.

## 2014-11-25 ENCOUNTER — Telehealth: Payer: Self-pay | Admitting: Hematology and Oncology

## 2014-11-25 NOTE — Telephone Encounter (Signed)
Dr Ike Bene office cld to state that pt will have labs done there-pt agreed-[facility] lab appt-they stated they will fax labs to Dr Valli Glance cld

## 2014-11-25 NOTE — Assessment & Plan Note (Signed)
Clinically, he has no signs of disease progression.  I discussed the natural history of multiple myeloma with him and his wife. At present time he has no evidence of organ damage. I recommend repeat blood work, history and physical examination in 6 months. The patient is educated to watch out for signs and symptoms of disease recurrence.

## 2014-11-25 NOTE — Assessment & Plan Note (Signed)
He has chronic mild hematuria and is on chronic oral iron replacement therapy. He is doing well. He will continue taking oral iron supplement indefinitely. 

## 2014-11-25 NOTE — Progress Notes (Signed)
[facility] OFFICE PROGRESS NOTE  Patient Care Team: [name], MD as PCP - General (Family Medicine) [name], MD as Consulting Physician (Hematology and Oncology) [name], MD as Consulting Physician (Physical Medicine and Rehabilitation) [name], PA-C as Physician Assistant (Physical Medicine and Rehabilitation) [name], MD as Consulting Physician (Hematology and Oncology)  SUMMARY OF ONCOLOGIC HISTORY:  1. 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 The patient underwent biopsy and cord decompression by Dr. Dierdre Harness on 07/15/2010. He underwent radiation treatments which concluded by 08/22/2010. The patient received treatment with Revlimid, Decadron, and Velcade in combination with Zometa. His initial IgG levels were as high as 5570 back in February 2012. Initial 24-hour urine protein came to 318 mg, positive for IgG heavy chains and lambda light chains. I do not think the patient ever had a bone marrow or a metastatic bone survey; however, CT scans of abdomen and pelvis carried out on 12/09/2010 have shown myelomatous involvement of the bones. A CT scan of the head on 05/05/2011 was normal. The patient has had an  outstanding response to treatment. IgG level from 09/11/2012 was 904. Serum immunofixation electrophoresis continues to show monoclonal IgG lambda protein. It appears that Velcade was discontinued in September 2012 and that Revlimid and Decadron were discontinued in March 2013. 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 The patient is presently on no specific treatment for his IgG lambda multiple myeloma.  2. Paraplegia which developed in December 2011. The patient is status post surgical decompression of the T8 paraspinal mass by  Dr. Erline Levine. He is also status post radiation treatments. The patient had chronic neurogenic bladder and bowel. He has placement of suprapubic catheter. 3. 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 He had an inferior vena cava filter had been placed on November 10, 2010 and he has been on Xarelto since 06/12/2012.  4. 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  5. He also has adrenal insufficiency on chronic prednisone therapy complicated by diabetes mellitus.  6.He has chronic anemia noted since 2014 treated with IV Feraheme and had infusion reactions.  7. 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 Isac Caddy.  INTERVAL HISTORY: Please see below for problem oriented charting. He is doing well. Denies recent infection. No new bone pain. He continues to take chronic oral iron supplement. Apart from a hematuria he denies any other new complaints  REVIEW OF SYSTEMS:   Constitutional: Denies fevers, chills or abnormal weight loss Eyes: Denies blurriness of vision Ears, nose, mouth, throat, and face: Denies mucositis or sore throat Respiratory: Denies cough, dyspnea or wheezes Cardiovascular: Denies palpitation, chest discomfort or lower extremity swelling Gastrointestinal:  Denies nausea, heartburn or change in bowel habits Skin: Denies abnormal skin rashes Lymphatics: Denies new lymphadenopathy or easy bruising Neurological:Denies numbness, tingling or new weaknesses Behavioral/Psych: Mood is stable, no new changes  All other systems were reviewed with the patient and are negative.  I have reviewed the past medical history, past surgical history, social history and family history with the patient and they are unchanged from previous note.  ALLERGIES:  has  No Known  Allergies.  MEDICATIONS:  Current Outpatient Prescriptions  Medication Sig Dispense Refill  . acetaminophen (TYLENOL) 325 MG tablet Take 325-650 mg by mouth every 6 (six) hours as needed. For pain.    . cholecalciferol (VITAMIN D) 1000 UNITS tablet Take 1,000 Units by mouth daily.    Marland Kitchen dexamethasone (DECADRON) 1 MG tablet Take 1 mg by mouth daily with breakfast.    . latanoprost (XALATAN) 0.005 % ophthalmic solution Place 1 drop into both eyes at bedtime.    . metFORMIN (GLUCOPHAGE) 500 MG tablet Take 500 mg by mouth 2 (two) times daily with a meal.    . methocarbamol (ROBAXIN) 500 MG tablet Take 500 mg by mouth every 6 (six) hours as needed. For muscle spasms.    . Rivaroxaban (XARELTO) 20 MG TABS tablet Take 20 mg by mouth daily with supper.     No current facility-administered medications for this visit.    PHYSICAL EXAMINATION: ECOG PERFORMANCE STATUS: 2 - Symptomatic, <50% confined to bed  Filed Vitals:   11/24/14 0955  BP: 137/73  Pulse: 84  Temp: 97.7 F (36.5 C)  Resp: 18   Filed Weights    GENERAL:alert, no distress and comfortable. Examination is limited due to wheelchair-bound status SKIN: skin color, texture, turgor are normal, no rashes or significant lesions EYES: normal, Conjunctiva are pink and non-injected, sclera clear OROPHARYNX:no exudate, no erythema and lips, buccal mucosa, and tongue normal  NECK: supple, thyroid normal size, non-tender, without nodularity LYMPH:  no palpable lymphadenopathy in the cervical, axillary or inguinal LUNGS: clear to auscultation and percussion with normal breathing effort HEART: regular rate & rhythm and no murmurs and no lower extremity edema ABDOMEN:abdomen soft, non-tender and normal bowel sounds Musculoskeletal:no cyanosis of digits and no clubbing  NEURO: alert & oriented x 3 with fluent speech, no focal motor/sensory deficits  LABORATORY DATA:  I have reviewed the data as listed    Component Value Date/Time   NA  140 11/24/2014 1038   NA 141 07/24/2014 0619   K 3.8 11/24/2014 1038   K 3.8 07/24/2014 0619   CL 108 07/24/2014 0619   CL 104 11/15/2012 0823   CO2 21* 11/24/2014 1038   CO2 27 07/24/2014 0619   GLUCOSE 102 11/24/2014 1038   GLUCOSE 91 07/24/2014 0619   GLUCOSE 141* 11/15/2012 0823   BUN 10.3 11/24/2014 1038   BUN 10 07/24/2014 0619   CREATININE 0.8 11/24/2014 1038   CREATININE 0.77 07/24/2014 0619   CALCIUM 9.6 11/24/2014 1038   CALCIUM 8.8 07/24/2014 0619   PROT 7.7 11/24/2014 1038   PROT 7.8 07/21/2014 0556   ALBUMIN 3.4* 11/24/2014 1038   ALBUMIN 3.0* 07/21/2014 0556   AST 20 11/24/2014 1038   AST 17 07/21/2014 0556   ALT 19 11/24/2014 1038   ALT 17 07/21/2014 0556   ALKPHOS 70 11/24/2014 1038   ALKPHOS 78 07/21/2014 0556   BILITOT 0.43 11/24/2014 1038   BILITOT 1.3* 07/21/2014 0556   GFRNONAA >90 07/24/2014 0619   GFRAA >90 07/24/2014 0619    No results found for: SPEP, UPEP  Lab Results  Component Value Date   WBC 11.1* 11/24/2014   NEUTROABS 8.2* 11/24/2014   HGB 12.3* 11/24/2014   HCT 35.5* 11/24/2014   MCV 71.1* 11/24/2014   PLT 438* 11/24/2014      Chemistry      Component Value Date/Time   NA 140 11/24/2014 1038   NA 141 07/24/2014 0619   K 3.8 11/24/2014 1038  K 3.8 07/24/2014 0619   CL 108 07/24/2014 0619   CL 104 11/15/2012 0823   CO2 21* 11/24/2014 1038   CO2 27 07/24/2014 0619   BUN 10.3 11/24/2014 1038   BUN 10 07/24/2014 0619   CREATININE 0.8 11/24/2014 1038   CREATININE 0.77 07/24/2014 0619   GLU 166* 09/19/2010 1538      Component Value Date/Time   CALCIUM 9.6 11/24/2014 1038   CALCIUM 8.8 07/24/2014 0619   ALKPHOS 70 11/24/2014 1038   ALKPHOS 78 07/21/2014 0556   AST 20 11/24/2014 1038   AST 17 07/21/2014 0556   ALT 19 11/24/2014 1038   ALT 17 07/21/2014 0556   BILITOT 0.43 11/24/2014 1038   BILITOT 1.3* 07/21/2014 0556      ASSESSMENT & PLAN:  Multiple myeloma Clinically, he has no signs of disease progression.  I  discussed the natural history of multiple myeloma with him and his wife. At present time he has no evidence of organ damage. I recommend repeat blood work, history and physical examination in 6 months. The patient is educated to watch out for signs and symptoms of disease recurrence.   Iron deficiency anemia He has chronic mild hematuria and is on chronic oral iron replacement therapy. He is doing well. He will continue taking oral iron supplement indefinitely.     Orders Placed This Encounter  Procedures  . CBC & Diff and Retic    Standing Status: Standing     Number of Occurrences: 2     Standing Expiration Date: 11/24/2015  . Comprehensive metabolic panel    Standing Status: Standing     Number of Occurrences: 2     Standing Expiration Date: 11/24/2015  . Lactate dehydrogenase    Standing Status: Standing     Number of Occurrences: 2     Standing Expiration Date: 11/24/2015  . SPEP & IFE with QIG    Standing Status: Standing     Number of Occurrences: 2     Standing Expiration Date: 11/24/2015  . Kappa/lambda light chains    Standing Status: Standing     Number of Occurrences: 2     Standing Expiration Date: 11/24/2015  . Beta 2 microglobulin, serum    Standing Status: Standing     Number of Occurrences: 2     Standing Expiration Date: 11/24/2015  . Ferritin    Standing Status: Standing     Number of Occurrences: 2     Standing Expiration Date: 11/24/2015   All questions were answered. The patient knows to call the clinic with any problems, questions or concerns. No barriers to learning was detected. I spent 15 minutes counseling the patient face to face. The total time spent in the appointment was 20 minutes and more than 50% was on counseling and review of test results     [facility], Jeanluc Wegman, MD 11/25/2014 1:49 PM

## 2014-11-26 LAB — SPEP & IFE WITH QIG
Abnormal Protein Band1: 0.3 g/dL
Albumin ELP: 3.5 g/dL — ABNORMAL LOW (ref 3.8–4.8)
Alpha-1-Globulin: 0.4 g/dL — ABNORMAL HIGH (ref 0.2–0.3)
Alpha-2-Globulin: 1.1 g/dL — ABNORMAL HIGH (ref 0.5–0.9)
Beta 2: 0.5 g/dL (ref 0.2–0.5)
Beta Globulin: 0.4 g/dL (ref 0.4–0.6)
Gamma Globulin: 1.4 g/dL (ref 0.8–1.7)
IgA: 197 mg/dL (ref 68–379)
IgG (Immunoglobin G), Serum: 1630 mg/dL — ABNORMAL HIGH (ref 650–1600)
IgM, Serum: 65 mg/dL (ref 41–251)
Total Protein, Serum Electrophoresis: 7.2 g/dL (ref 6.1–8.1)

## 2014-11-26 LAB — KAPPA/LAMBDA LIGHT CHAINS
Kappa free light chain: 2.79 mg/dL — ABNORMAL HIGH (ref 0.33–1.94)
Kappa:Lambda Ratio: 1.51 (ref 0.26–1.65)
Lambda Free Lght Chn: 1.85 mg/dL (ref 0.57–2.63)

## 2014-11-26 LAB — BETA 2 MICROGLOBULIN, SERUM: Beta-2 Microglobulin: 2.39 mg/L (ref <=2.51)

## 2014-11-27 ENCOUNTER — Other Ambulatory Visit: Payer: Self-pay | Admitting: Hematology and Oncology

## 2014-11-27 ENCOUNTER — Telehealth: Payer: Self-pay | Admitting: *Deleted

## 2014-11-27 ENCOUNTER — Telehealth: Payer: Self-pay | Admitting: Hematology and Oncology

## 2014-11-27 NOTE — Telephone Encounter (Signed)
Informed wife of Dr. Calton Dach note below regarding lab work and wife already aware of schedule w/ labs one week prior.  She verbalized understanding.

## 2014-11-27 NOTE — Telephone Encounter (Signed)
-----   Message from [name], MD sent at 11/27/2014  8:25 AM EDT ----- Regarding: test result [phone]    Please let him know all tests including myeloma tests look good. Minimum change. Iron level is good      Also, before you call him, looks like scheduler forgot again to put labs 1 week prior to appt   Please make sure he has lab appt 1 week before I see him. I will send shameeka a note to add lab appt

## 2014-11-27 NOTE — Telephone Encounter (Signed)
s.w. pt wife and advised on NOV appt...ok and aware

## 2015-05-25 ENCOUNTER — Other Ambulatory Visit: Payer: Medicare (Managed Care)

## 2015-05-25 ENCOUNTER — Other Ambulatory Visit: Payer: Self-pay | Admitting: Hematology and Oncology

## 2015-05-25 ENCOUNTER — Telehealth: Payer: Self-pay | Admitting: Hematology and Oncology

## 2015-05-25 ENCOUNTER — Telehealth: Payer: Self-pay

## 2015-05-25 DIAGNOSIS — C9000 Multiple myeloma not having achieved remission: Secondary | ICD-10-CM

## 2015-05-25 DIAGNOSIS — D509 Iron deficiency anemia, unspecified: Secondary | ICD-10-CM

## 2015-05-25 NOTE — Telephone Encounter (Signed)
Per 11/1 pof r/s missed 11/1 lab and r/s f/u w/NG (11/8) to be one week after lab appointment. Left message for patient re new appointments for 11/10 and 11/17 - also informed patient 11/8 f/u cxd. Schedule mailed.

## 2015-05-25 NOTE — Telephone Encounter (Signed)
lvm missed lab appt. Our schedulers will call him with new lab appt and new MD appt 7 days after labs drawn.

## 2015-05-26 ENCOUNTER — Telehealth: Payer: Self-pay | Admitting: *Deleted

## 2015-05-26 NOTE — Telephone Encounter (Signed)
Wife states pt had Labs drawn yesterday at [facility].  His MD visit w/ Dr. Alvy Bimler was r/s because he missed his lab appt here and we were not aware he was having them done at [facility].   I called [facility] (ph#[phone]) and s/w a Scheduler named Sonya. Confirmed they did draw labs;  CBC, CMP, Ferritin, Kappa/Lambda light chains, SPEP & IFE w/ QIG, and LDH, yesterday.  They expect results by the end of this week and will fax them to [place] as soon as they get them.   I called wife back and informed her we have now canceled pt's lab appt here and his MD appt is on 11/17.  Wife says she cannot make it on the 17 th and asks if it can be any other day that week or moved back to 11/8 as originally scheduled?   Informed her will ask Dr. Alvy Bimler and call her back w/ new appointment.   Will also need to call Sonya at [facility] back w/ new appt d/t so they can arrange transportation.

## 2015-05-28 ENCOUNTER — Telehealth: Payer: Self-pay | Admitting: *Deleted

## 2015-05-28 NOTE — Telephone Encounter (Signed)
Keep appt as is for now

## 2015-05-28 NOTE — Telephone Encounter (Signed)
Confirmed appt for 11/17 @ 1000 per Dr Felecia Jan at [facility] so she can arrange transportation

## 2015-05-31 ENCOUNTER — Telehealth: Payer: Self-pay | Admitting: *Deleted

## 2015-05-31 NOTE — Telephone Encounter (Signed)
Called wife to r/s pt's MD appt as she had said she can not make it on 11/17 as scheduled.  Wife says she appreciates the offer to r/s but she is going to leave his appt as scheduled on 11/17.  Pt's transportation has already been arranged and wife say pt is ok w/ her not going this time.

## 2015-06-01 ENCOUNTER — Ambulatory Visit: Payer: Medicare (Managed Care) | Admitting: Hematology and Oncology

## 2015-06-03 ENCOUNTER — Other Ambulatory Visit: Payer: Medicare (Managed Care)

## 2015-06-10 ENCOUNTER — Encounter: Payer: Self-pay | Admitting: Hematology and Oncology

## 2015-06-10 ENCOUNTER — Telehealth: Payer: Self-pay | Admitting: Hematology and Oncology

## 2015-06-10 ENCOUNTER — Ambulatory Visit (HOSPITAL_BASED_OUTPATIENT_CLINIC_OR_DEPARTMENT_OTHER): Payer: Medicare (Managed Care) | Admitting: Hematology and Oncology

## 2015-06-10 VITALS — BP 111/75 | HR 89 | Temp 98.4°F | Resp 18

## 2015-06-10 DIAGNOSIS — G8220 Paraplegia, unspecified: Secondary | ICD-10-CM | POA: Diagnosis not present

## 2015-06-10 DIAGNOSIS — C9001 Multiple myeloma in remission: Secondary | ICD-10-CM | POA: Diagnosis not present

## 2015-06-10 DIAGNOSIS — I8290 Acute embolism and thrombosis of unspecified vein: Secondary | ICD-10-CM | POA: Diagnosis not present

## 2015-06-10 DIAGNOSIS — Z86718 Personal history of other venous thrombosis and embolism: Secondary | ICD-10-CM | POA: Insufficient documentation

## 2015-06-10 NOTE — Telephone Encounter (Signed)
Gave and pirnted appt sched and avs for pt for Aug 2017

## 2015-06-10 NOTE — Progress Notes (Signed)
[name] OFFICE PROGRESS NOTE  Patient Care Team: [name], MD as PCP - General (Family Medicine) [name], MD as Consulting Physician (Hematology and Oncology) [name], MD as Consulting Physician (Physical Medicine and Rehabilitation) [name], PA-C as Physician Assistant (Physical Medicine and Rehabilitation) [name], MD as Consulting Physician (Hematology and Oncology)  SUMMARY OF ONCOLOGIC HISTORY:  1. 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 The patient underwent biopsy and cord decompression by Dr. Dierdre Harness on 07/15/2010. He underwent radiation treatments which concluded by 08/22/2010. The patient received treatment with Revlimid, Decadron, and Velcade in combination with Zometa. His initial IgG levels were as high as 5570 back in February 2012. Initial 24-hour urine protein came to 318 mg, positive for IgG heavy chains and lambda light chains. I do not think the patient ever had a bone marrow or a metastatic bone survey; however, CT scans of abdomen and pelvis carried out on 12/09/2010 have shown myelomatous involvement of the bones. A CT scan of the head on 05/05/2011 was normal. The patient has had an  outstanding response to treatment. IgG level from 09/11/2012 was 904. Serum immunofixation electrophoresis continues to show monoclonal IgG lambda protein. It appears that Velcade was discontinued in September 2012 and that Revlimid and Decadron were discontinued in March 2013. 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 The patient is presently on no specific treatment for his IgG lambda multiple myeloma.  2. Paraplegia which developed in December 2011. The patient is status post surgical decompression of the T8 paraspinal mass by  Dr. Erline Levine. He is also status post radiation treatments. The patient had chronic neurogenic bladder and bowel. He has placement of suprapubic catheter. 3. 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 He had an inferior vena cava filter had been placed on November 10, 2010 and he has been on Xarelto since 06/12/2012.  4. 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  5. He also has adrenal insufficiency on chronic prednisone therapy complicated by diabetes mellitus.  6.He has chronic anemia noted since 2014 treated with IV Feraheme and had infusion reactions.  7. 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 Isac Caddy.  INTERVAL HISTORY: Please see below for problem oriented charting. He is seen today for further follow-up.  His doing very well. Denies new bone pain. No recent fractures. He denies further bleeding from his suprapubic catheter except during the time to change his catheter. He has excellent energy level. Denies recent infection  REVIEW OF SYSTEMS:   Constitutional: Denies fevers, chills or abnormal weight loss Eyes: Denies blurriness of vision Ears, nose, mouth, throat, and face: Denies mucositis or sore throat Respiratory: Denies cough, dyspnea or wheezes Cardiovascular: Denies palpitation, chest discomfort or lower extremity swelling Gastrointestinal:  Denies nausea, heartburn or change in bowel habits Skin: Denies abnormal skin rashes Lymphatics: Denies new lymphadenopathy or easy bruising Neurological:Denies numbness, tingling or new weaknesses Behavioral/Psych: Mood is stable, no new changes  All other systems were reviewed with the patient and are negative.  I have reviewed the past medical history, past surgical history, social history and family history  with the patient and they are unchanged from previous note.  ALLERGIES:  has No Known Allergies.  MEDICATIONS:  Current Outpatient Prescriptions  Medication Sig Dispense Refill  . acetaminophen (TYLENOL) 325 MG tablet Take 325-650 mg by mouth every 6 (six) hours as needed. For pain.    . cholecalciferol (VITAMIN D) 1000 UNITS tablet Take 1,000 Units by mouth daily.    Marland Kitchen dexamethasone (DECADRON) 1 MG tablet Take 1 mg by mouth daily with breakfast.    . latanoprost (XALATAN) 0.005 % ophthalmic solution Place 1 drop into both eyes at bedtime.    . metFORMIN (GLUCOPHAGE) 500 MG tablet Take 500 mg by mouth 2 (two) times daily with a meal.    . methocarbamol (ROBAXIN) 500 MG tablet Take 500 mg by mouth every 6 (six) hours as needed. For muscle spasms.    . Rivaroxaban (XARELTO) 20 MG TABS tablet Take 20 mg by mouth daily with supper.     No current facility-administered medications for this visit.    PHYSICAL EXAMINATION: ECOG PERFORMANCE STATUS: 0 - Asymptomatic  Filed Vitals:   06/10/15 1012  BP: 111/75  Pulse: 89  Temp: 98.4 F (36.9 C)  Resp: 18   Filed Weights    GENERAL:alert, no distress and comfortable. He is examined sitting on the wheelchair SKIN: skin color, texture, turgor are normal, no rashes or significant lesions EYES: normal, Conjunctiva are pink and non-injected, sclera clear OROPHARYNX:no exudate, no erythema and lips, buccal mucosa, and tongue normal  NECK: supple, thyroid normal size, non-tender, without nodularity LYMPH:  no palpable lymphadenopathy in the cervical, axillary or inguinal LUNGS: clear to auscultation and percussion with normal breathing effort HEART: regular rate & rhythm and no murmurs and no lower extremity edema Musculoskeletal:no cyanosis of digits and no clubbing  NEURO: alert & oriented x 3 with fluent speech  LABORATORY DATA:  I have reviewed the data as listed    Component Value Date/Time   NA 140 11/24/2014 1038   NA 141  07/24/2014 0619   K 3.8 11/24/2014 1038   K 3.8 07/24/2014 0619   CL 108 07/24/2014 0619   CL 104 11/15/2012 0823   CO2 21* 11/24/2014 1038   CO2 27 07/24/2014 0619   GLUCOSE 102 11/24/2014 1038   GLUCOSE 91 07/24/2014 0619   GLUCOSE 141* 11/15/2012 0823   BUN 10.3 11/24/2014 1038   BUN 10 07/24/2014 0619   CREATININE 0.8 11/24/2014 1038   CREATININE 0.77 07/24/2014 0619   CALCIUM 9.6 11/24/2014 1038   CALCIUM 8.8 07/24/2014 0619   PROT 7.7 11/24/2014 1038   PROT 7.8 07/21/2014 0556   ALBUMIN 3.4* 11/24/2014 1038   ALBUMIN 3.0* 07/21/2014 0556   AST 20 11/24/2014 1038   AST 17 07/21/2014 0556   ALT 19 11/24/2014 1038   ALT 17 07/21/2014 0556   ALKPHOS 70 11/24/2014 1038   ALKPHOS 78 07/21/2014 0556   BILITOT 0.43 11/24/2014 1038   BILITOT 1.3* 07/21/2014 0556   GFRNONAA >90 07/24/2014 0619   GFRAA >90 07/24/2014 0619    No results found for: SPEP, UPEP  Lab Results  Component Value Date   WBC 11.1* 11/24/2014   NEUTROABS 8.2* 11/24/2014   HGB 12.3* 11/24/2014   HCT 35.5* 11/24/2014   MCV 71.1* 11/24/2014   PLT 438* 11/24/2014      Chemistry      Component Value Date/Time   NA 140 11/24/2014 1038   NA 141 07/24/2014 0619   K 3.8 11/24/2014 1038  K 3.8 07/24/2014 0619   CL 108 07/24/2014 0619   CL 104 11/15/2012 0823   CO2 21* 11/24/2014 1038   CO2 27 07/24/2014 0619   BUN 10.3 11/24/2014 1038   BUN 10 07/24/2014 0619   CREATININE 0.8 11/24/2014 1038   CREATININE 0.77 07/24/2014 0619   GLU 166* 09/19/2010 1538      Component Value Date/Time   CALCIUM 9.6 11/24/2014 1038   CALCIUM 8.8 07/24/2014 0619   ALKPHOS 70 11/24/2014 1038   ALKPHOS 78 07/21/2014 0556   AST 20 11/24/2014 1038   AST 17 07/21/2014 0556   ALT 19 11/24/2014 1038   ALT 17 07/21/2014 0556   BILITOT 0.43 11/24/2014 1038   BILITOT 1.3* 07/21/2014 0556      ASSESSMENT & PLAN:  Multiple myeloma in remission ([place]) Clinically, he has no signs of disease progression.  I discussed  the natural history of multiple myeloma with him. At present time he has no evidence of organ damage. I recommend repeat blood work, history and physical examination in 9 months.  The patient is educated to watch out for signs and symptoms of disease recurrence.  he will continue vitamin D supplement  Paraplegia  He is coping well and have no problems mobilizing around  Deep vein thrombosis (DVT) ([place]) He had placement of IVC filter remain on Xarelto. Due to poor mobility, and lack of bleeding complications, I recommend he remain on Xarelto indefinitely.   No orders of the defined types were placed in this encounter.   All questions were answered. The patient knows to call the clinic with any problems, questions or concerns. No barriers to learning was detected. I spent 15 minutes counseling the patient face to face. The total time spent in the appointment was 20 minutes and more than 50% was on counseling and review of test results     [facility], [place], MD 06/10/2015 10:35 AM

## 2015-06-10 NOTE — Assessment & Plan Note (Signed)
Clinically, he has no signs of disease progression.  I discussed the natural history of multiple myeloma with him. At present time he has no evidence of organ damage. I recommend repeat blood work, history and physical examination in 9 months.  The patient is educated to watch out for signs and symptoms of disease recurrence.  he will continue vitamin D supplement

## 2015-06-10 NOTE — Assessment & Plan Note (Signed)
He had placement of IVC filter remain on Xarelto. Due to poor mobility, and lack of bleeding complications, I recommend he remain on Xarelto indefinitely. 

## 2015-06-10 NOTE — Assessment & Plan Note (Signed)
He is coping well and have no problems mobilizing around

## 2016-03-02 ENCOUNTER — Other Ambulatory Visit (HOSPITAL_BASED_OUTPATIENT_CLINIC_OR_DEPARTMENT_OTHER): Payer: Medicare (Managed Care)

## 2016-03-02 DIAGNOSIS — C9000 Multiple myeloma not having achieved remission: Secondary | ICD-10-CM | POA: Diagnosis not present

## 2016-03-02 DIAGNOSIS — D509 Iron deficiency anemia, unspecified: Secondary | ICD-10-CM

## 2016-03-02 LAB — CBC WITH DIFFERENTIAL/PLATELET
BASO%: 0.4 % (ref 0.0–2.0)
Basophils Absolute: 0 10*3/uL (ref 0.0–0.1)
EOS%: 3.9 % (ref 0.0–7.0)
Eosinophils Absolute: 0.3 10*3/uL (ref 0.0–0.5)
HCT: 38.1 % — ABNORMAL LOW (ref 38.4–49.9)
HGB: 13.3 g/dL (ref 13.0–17.1)
LYMPH%: 29.4 % (ref 14.0–49.0)
MCH: 26 pg — ABNORMAL LOW (ref 27.2–33.4)
MCHC: 34.9 g/dL (ref 32.0–36.0)
MCV: 74.6 fL — ABNORMAL LOW (ref 79.3–98.0)
MONO#: 0.5 10*3/uL (ref 0.1–0.9)
MONO%: 5.8 % (ref 0.0–14.0)
NEUT#: 4.8 10*3/uL (ref 1.5–6.5)
NEUT%: 60.5 % (ref 39.0–75.0)
Platelets: 399 10*3/uL (ref 140–400)
RBC: 5.11 10*6/uL (ref 4.20–5.82)
RDW: 15.9 % — ABNORMAL HIGH (ref 11.0–14.6)
WBC: 7.9 10*3/uL (ref 4.0–10.3)
lymph#: 2.3 10*3/uL (ref 0.9–3.3)

## 2016-03-02 LAB — COMPREHENSIVE METABOLIC PANEL
ALT: 29 U/L (ref 0–55)
AST: 25 U/L (ref 5–34)
Albumin: 3.6 g/dL (ref 3.5–5.0)
Alkaline Phosphatase: 80 U/L (ref 40–150)
Anion Gap: 11 mEq/L (ref 3–11)
BUN: 10.7 mg/dL (ref 7.0–26.0)
CO2: 25 mEq/L (ref 22–29)
Calcium: 10.2 mg/dL (ref 8.4–10.4)
Chloride: 107 mEq/L (ref 98–109)
Creatinine: 0.8 mg/dL (ref 0.7–1.3)
EGFR: >90 mL/min/{1.73_m2} (ref >90)
Glucose: 106 mg/dl (ref 70–140)
Potassium: 3.8 mEq/L (ref 3.5–5.1)
Sodium: 142 mEq/L (ref 136–145)
Total Bilirubin: 0.52 mg/dL (ref 0.20–1.20)
Total Protein: 8 g/dL (ref 6.4–8.3)

## 2016-03-02 LAB — FERRITIN: Ferritin: 275 ng/ml (ref 22–316)

## 2016-03-03 LAB — MULTIPLE MYELOMA PANEL, SERUM
Albumin SerPl Elph-Mcnc: 3.7 g/dL (ref 2.9–4.4)
Albumin/Glob SerPl: 1.1 (ref 0.7–1.7)
Alpha 1: 0.2 g/dL (ref 0.0–0.4)
Alpha2 Glob SerPl Elph-Mcnc: 0.9 g/dL (ref 0.4–1.0)
B-Globulin SerPl Elph-Mcnc: 1.1 g/dL (ref 0.7–1.3)
Gamma Glob SerPl Elph-Mcnc: 1.2 g/dL (ref 0.4–1.8)
Globulin, Total: 3.5 g/dL (ref 2.2–3.9)
IgA, Qn, Serum: 179 mg/dL (ref 61–437)
IgG, Qn, Serum: 1031 mg/dL (ref 700–1600)
IgM, Qn, Serum: 80 mg/dL (ref 20–172)
M Protein SerPl Elph-Mcnc: 0.3 g/dL — ABNORMAL HIGH
Total Protein: 7.2 g/dL (ref 6.0–8.5)

## 2016-03-03 LAB — KAPPA/LAMBDA LIGHT CHAINS
Ig Kappa Free Light Chain: 18.6 mg/L (ref 3.3–19.4)
Ig Lambda Free Light Chain: 16 mg/L (ref 5.7–26.3)
Kappa/Lambda FluidC Ratio: 1.16 (ref 0.26–1.65)

## 2016-03-09 ENCOUNTER — Encounter: Payer: Self-pay | Admitting: Hematology and Oncology

## 2016-03-09 ENCOUNTER — Ambulatory Visit (HOSPITAL_BASED_OUTPATIENT_CLINIC_OR_DEPARTMENT_OTHER): Payer: Medicare (Managed Care) | Admitting: Hematology and Oncology

## 2016-03-09 ENCOUNTER — Telehealth: Payer: Self-pay | Admitting: Hematology and Oncology

## 2016-03-09 VITALS — BP 116/71 | HR 66 | Temp 98.2°F | Resp 16 | Ht 68.0 in

## 2016-03-09 DIAGNOSIS — Z86718 Personal history of other venous thrombosis and embolism: Secondary | ICD-10-CM | POA: Diagnosis not present

## 2016-03-09 DIAGNOSIS — C9001 Multiple myeloma in remission: Secondary | ICD-10-CM | POA: Diagnosis not present

## 2016-03-09 DIAGNOSIS — D509 Iron deficiency anemia, unspecified: Secondary | ICD-10-CM | POA: Diagnosis not present

## 2016-03-09 NOTE — Progress Notes (Signed)
[facility] OFF[facility] PROGRESS NOTE  Patient Care Team: [name], MD as PCP - General (Family Medicine) [name], MD as Consulting Physician (Hematology and Oncology) [name], MD as Consulting Physician (Physical Medicine and Rehabilitation) [name], PA-C as Physician Assistant (Physical Medicine and Rehabilitation) [name], MD as Consulting Physician (Hematology and Oncology)  SUMMARY OF ONCOLOGIC HISTORY:  1. 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 The patient underwent biopsy and cord decompression by Dr. Dierdre Harness on 07/15/2010. He underwent radiation treatments which concluded by 08/22/2010. The patient received treatment with Revlimid, Decadron, and Velcade in combination with Zometa. His initial IgG levels were as high as 5570 back in February 2012. Initial 24-hour urine protein came to 318 mg, positive for IgG heavy chains and lambda light chains. I do not think the patient ever had a bone marrow or a metastatic bone survey; however, CT scans of abdomen and pelvis carried out on 12/09/2010 have shown myelomatous involvement of the bones. A CT scan of the head on 05/05/2011 was normal. The patient has had an  outstanding response to treatment. IgG level from 09/11/2012 was 904. Serum immunofixation electrophoresis continues to show monoclonal IgG lambda protein. It appears that Velcade was discontinued in September 2012 and that Revlimid and Decadron were discontinued in March 2013. 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 The patient is presently on no specific treatment for his IgG lambda multiple myeloma.  2. Paraplegia which developed in December 2011. The patient is status post surgical decompression of the T8 paraspinal mass by  Dr. Erline Levine. He is also status post radiation treatments. The patient had chronic neurogenic bladder and bowel. He has placement of suprapubic catheter. 3. 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 He had an inferior vena cava filter had been placed on November 10, 2010 and he has been on Xarelto since 06/12/2012.  4. 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  5. He also has adrenal insufficiency on chronic prednisone therapy complicated by diabetes mellitus.  6.He has chronic anemia noted since 2014 treated with IV Feraheme and had infusion reactions.  7. 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 Isac Caddy.  INTERVAL HISTORY: Please see below for problem oriented charting. He returns for follow-up. He is doing well. He denies bone pain. Denies recent infection. The patient denies any recent signs or symptoms of bleeding such as spontaneous epistaxis, hematuria or hematochezia.  REVIEW OF SYSTEMS:   Constitutional: Denies fevers, chills or abnormal weight loss Eyes: Denies blurriness of vision Ears, nose, mouth, throat, and face: Denies mucositis or sore throat Respiratory: Denies cough, dyspnea or wheezes Cardiovascular: Denies palpitation, chest discomfort or lower extremity swelling Gastrointestinal:  Denies nausea, heartburn or change in bowel habits Skin: Denies abnormal skin rashes Lymphatics: Denies new lymphadenopathy or easy bruising Neurological:Denies numbness, tingling or new weaknesses Behavioral/Psych: Mood is stable, no new changes  All other systems were reviewed with the patient and are negative.  I have reviewed the past medical history, past surgical history, social history and family history with the patient and they are unchanged from previous  note.  ALLERGIES:  has No Known Allergies.  MEDICATIONS:  Current Outpatient Prescriptions  Medication Sig Dispense Refill  . acetaminophen (TYLENOL) 325 MG tablet Take 325-650 mg by mouth every 6 (six) hours as needed. For pain.    . cholecalciferol (VITAMIN D) 1000 UNITS tablet Take 1,000 Units by mouth daily.    Marland Kitchen dexamethasone (DECADRON) 1 MG tablet Take 1 mg by mouth daily with breakfast.    . latanoprost (XALATAN) 0.005 % ophthalmic solution Place 1 drop into both eyes at bedtime.    . metFORMIN (GLUCOPHAGE) 500 MG tablet Take 500 mg by mouth 2 (two) times daily with a meal.    . methocarbamol (ROBAXIN) 500 MG tablet Take 500 mg by mouth every 6 (six) hours as needed. For muscle spasms.    . Rivaroxaban (XARELTO) 20 MG TABS tablet Take 20 mg by mouth daily with supper.     No current facility-administered medications for this visit.     PHYSICAL EXAMINATION: ECOG PERFORMANCE STATUS: 2 - Symptomatic, <50% confined to bed  Vitals:   03/09/16 0954  BP: 116/71  Pulse: 66  Resp: 16  Temp: 98.2 F (36.8 C)   Filed Weights    GENERAL:alert, no distress and comfortable. He is sitting on the wheelchair SKIN: skin color, texture, turgor are normal, no rashes or significant lesions EYES: normal, Conjunctiva are pink and non-injected, sclera clear OROPHARYNX:no exudate, no erythema and lips, buccal mucosa, and tongue normal  NECK: supple, thyroid normal size, non-tender, without nodularity LYMPH:  no palpable lymphadenopathy in the cervical, axillary or inguinal LUNGS: clear to auscultation and percussion with normal breathing effort HEART: regular rate & rhythm and no murmurs and no lower extremity edema Musculoskeletal:no cyanosis of digits and no clubbing   LABORATORY DATA:  I have reviewed the data as listed    Component Value Date/Time   NA 142 03/02/2016 0857   K 3.8 03/02/2016 0857   CL 108 07/24/2014 0619   CL 104 11/15/2012 0823   CO2 25 03/02/2016 0857   GLUCOSE  106 03/02/2016 0857   GLUCOSE 141 (H) 11/15/2012 0823   BUN 10.7 03/02/2016 0857   CREATININE 0.8 03/02/2016 0857   CALCIUM 10.2 03/02/2016 0857   PROT 7.2 03/02/2016 0857   PROT 8.0 03/02/2016 0857   ALBUMIN 3.6 03/02/2016 0857   AST 25 03/02/2016 0857   ALT 29 03/02/2016 0857   ALKPHOS 80 03/02/2016 0857   BILITOT 0.52 03/02/2016 0857   GFRNONAA >90 07/24/2014 0619   GFRAA >90 07/24/2014 0619    No results found for: SPEP, UPEP  Lab Results  Component Value Date   WBC 7.9 03/02/2016   NEUTROABS 4.8 03/02/2016   HGB 13.3 03/02/2016   HCT 38.1 (L) 03/02/2016   MCV 74.6 (L) 03/02/2016   PLT 399 03/02/2016      Chemistry      Component Value Date/Time   NA 142 03/02/2016 0857   K 3.8 03/02/2016 0857   CL 108 07/24/2014 0619   CL 104 11/15/2012 0823   CO2 25 03/02/2016 0857   BUN 10.7 03/02/2016 0857   CREATININE 0.8 03/02/2016 0857   GLU 166 (H) 09/19/2010 1538      Component Value Date/Time   CALCIUM 10.2 03/02/2016 0857   ALKPHOS 80 03/02/2016 0857   AST 25 03/02/2016 0857   ALT 29 03/02/2016 0857   BILITOT 0.52 03/02/2016 0857       ASSESSMENT & PLAN:  Multiple myeloma in remission ([place]) Clinically, he has no  signs of disease progression.  I discussed the natural history of multiple myeloma with him. At present time he has no evidence of organ damage. I recommend repeat blood work, history and physical examination in 1 year.  The patient is educated to watch out for signs and symptoms of disease recurrence.  he will continue vitamin D supplement  Iron deficiency anemia He has chronic mild hematuria and is on chronic oral iron replacement therapy. He is doing well. He may stop iron supplement now as he has not observed any further bleeding   History of DVT (deep vein thrombosis) He had placement of IVC filter remain on Xarelto. Due to poor mobility, and lack of bleeding complications, I recommend he remain on Xarelto indefinitely.   Orders Placed  This Encounter  Procedures  . CBC with Differential/Platelet    Standing Status:   Future    Standing Expiration Date:   04/13/2017  . Comprehensive metabolic panel    Standing Status:   Future    Standing Expiration Date:   04/13/2017  . Kappa/lambda light chains    Standing Status:   Future    Standing Expiration Date:   04/13/2017  . Multiple Myeloma Panel (SPEP&IFE w/QIG)    Standing Status:   Future    Standing Expiration Date:   04/13/2017  . Ferritin    Standing Status:   Future    Standing Expiration Date:   04/13/2017   All questions were answered. The patient knows to call the clinic with any problems, questions or concerns. No barriers to learning was detected. I spent 15 minutes counseling the patient face to face. The total time spent in the appointment was 20 minutes and more than 50% was on counseling and review of test results     [facility], [place], MD 03/09/2016 10:17 AM

## 2016-03-09 NOTE — Assessment & Plan Note (Signed)
He had placement of IVC filter remain on Xarelto. Due to poor mobility, and lack of bleeding complications, I recommend he remain on Xarelto indefinitely. 

## 2016-03-09 NOTE — Telephone Encounter (Signed)
Gave patient avs report and appointments for August 2018

## 2016-03-09 NOTE — Assessment & Plan Note (Signed)
He has chronic mild hematuria and is on chronic oral iron replacement therapy. He is doing well. He may stop iron supplement now as he has not observed any further bleeding

## 2016-03-09 NOTE — Assessment & Plan Note (Signed)
Clinically, he has no signs of disease progression.  I discussed the natural history of multiple myeloma with him. At present time he has no evidence of organ damage. I recommend repeat blood work, history and physical examination in 1 year.  The patient is educated to watch out for signs and symptoms of disease recurrence.  he will continue vitamin D supplement

## 2016-03-10 ENCOUNTER — Encounter: Payer: Self-pay | Admitting: *Deleted

## 2016-11-16 ENCOUNTER — Other Ambulatory Visit: Payer: Self-pay | Admitting: Nurse Practitioner

## 2016-11-16 ENCOUNTER — Ambulatory Visit
Admission: RE | Admit: 2016-11-16 | Discharge: 2016-11-16 | Disposition: A | Discharge status: Home / Self Care | Payer: Medicare (Managed Care) | Source: Ambulatory Visit | Attending: Nurse Practitioner | Admitting: Nurse Practitioner

## 2016-11-16 DIAGNOSIS — R059 Cough, unspecified: Secondary | ICD-10-CM

## 2016-11-16 DIAGNOSIS — R05 Cough: Secondary | ICD-10-CM

## 2017-02-15 ENCOUNTER — Other Ambulatory Visit (INDEPENDENT_AMBULATORY_CARE_PROVIDER_SITE_OTHER): Payer: Medicare (Managed Care)

## 2017-02-15 ENCOUNTER — Other Ambulatory Visit: Payer: Self-pay | Admitting: *Deleted

## 2017-02-15 ENCOUNTER — Ambulatory Visit (INDEPENDENT_AMBULATORY_CARE_PROVIDER_SITE_OTHER): Payer: Medicare (Managed Care) | Admitting: Physician Assistant

## 2017-02-15 ENCOUNTER — Encounter: Payer: Self-pay | Admitting: Physician Assistant

## 2017-02-15 VITALS — BP 128/78 | HR 78 | Ht 69.0 in | Wt 225.0 lb

## 2017-02-15 DIAGNOSIS — K625 Hemorrhage of anus and rectum: Secondary | ICD-10-CM | POA: Diagnosis not present

## 2017-02-15 LAB — FERRITIN: Ferritin: 162 ng/mL (ref 22.0–322.0)

## 2017-02-15 LAB — IBC PANEL
Iron: 37 ug/dL — ABNORMAL LOW (ref 42–165)
Saturation Ratios: 12.5 % — ABNORMAL LOW (ref 20.0–50.0)
Transferrin: 211 mg/dL — ABNORMAL LOW (ref 212.0–360.0)

## 2017-02-15 NOTE — Patient Instructions (Addendum)
Please go to the basement level to have your labs drawn.   You have been scheduled for a colonoscopy. Please follow written instructions given to you at your visit today.  Please pick up your prep supplies at the pharmacy within the next 1-3 days. [place].  If you use inhalers (even only as needed), please bring them with you on the day of your procedure. Your physician has requested that you go to www.startemmi.com and enter the access code given to you at your visit today. This web site gives a general overview about your procedure. However, you should still follow specific instructions given to you by our office regarding your preparation for the procedure.

## 2017-02-15 NOTE — Progress Notes (Signed)
Subjective:    Patient ID: [name], male    DOB: January 25, 1956, 61 y.o.   MRN: [id number]  HPI [name] is a pleasant 61 year old African-American male, new to GI today referred by Dr. Madelynn Howell of the [facility] for evaluation of intermittent rectal bleeding. Patient has history of multiple myeloma which was complicated by a paraplegia in 2013. He is currently in remission. He has had complication of neurogenic bladder. He also has history of DVT which she says was a couple of years ago and has been maintained on [name] since. Patient underwent an emergent laparotomy and sigmoid colostomy in 2013 per Dr. Lilyan Howell for perforated diverticulitis and has a permanent colostomy. Patient states he has not had any previous GI evaluation and no prior colonoscopy. He says his colostomy functions well and he has not noted any melena or hematochezia. The ostomy. Is no complaints of abdominal pain appetite has been fine and weight has been stable. His caregivers have noted that he has oozing of blood from his rectum intermittently, and patient says this has been occurring for quite a while. He has no rectal sensation and therefore no complaints of discomfort etc. He is uncertain about family history but feels that his mother may have had colon cancer. Most recent labs April 2018 hemoglobin 13.4 hematocrit of 40 and MCV of 78 and MCH of 26 creatinine 0.94 LFTs within normal limits.  Review of Systems Pertinent positive and negative review of systems were noted in the above HPI section.  All other review of systems was otherwise negative.  Outpatient Encounter Prescriptions as of 02/15/2017  Medication Sig  . acetaminophen (TYLENOL) 325 MG tablet Take 325-650 mg by mouth every 6 (six) hours as needed. For pain.  . brinzolamide (AZOPT) 1 % ophthalmic suspension Place 1 drop into both eyes 2 (two) times daily.  . cholecalciferol (VITAMIN D) 1000 UNITS tablet Take 1,000 Units by mouth daily.  Marland Kitchen dexamethasone  (DECADRON) 1 MG tablet Take 1 mg by mouth daily with breakfast.  . latanoprost (XALATAN) 0.005 % ophthalmic solution Place 1 drop into both eyes at bedtime.  . metFORMIN (GLUCOPHAGE) 500 MG tablet Take 500 mg by mouth 2 (two) times daily with a meal.  . methocarbamol (ROBAXIN) 500 MG tablet Take 500 mg by mouth every 6 (six) hours as needed. For muscle spasms.  . [name] ([name]) 20 MG TABS tablet Take 20 mg by mouth daily with supper.  . Skin Protectants, Misc. (EUCERIN) cream Apply topically as needed for dry skin.  Marland Kitchen zinc oxide (BALMEX) 11.3 % CREA cream Apply 1 application topically 2 (two) times daily.   No facility-administered encounter medications on file as of 02/15/2017.    Allergies  Allergen Reactions  . Feraheme [Ferumoxytol]    Patient Active Problem List   Diagnosis Date Noted  . History of DVT (deep vein thrombosis) 06/10/2015  . Obstructed suprapubic catheter ([name]) 07/21/2014  . Bilateral hydronephrosis 07/21/2014  . Acute kidney failure ([name]) 07/20/2014  . Abdominal pain 07/20/2014  . AKI (acute kidney injury) ([name]) 07/20/2014  . Hematuria 02/12/2013  . Shock ([name]) 02/12/2013  . Adrenal insufficiency ([name]) 02/12/2013  . Hypoalbuminemia 11/15/2012  . Anemia, unspecified 09/18/2012  . Unspecified vitamin D deficiency 09/18/2012  . Osteonecrosis of jaw ([name]) due to Zometa 09/18/2012  . Iron deficiency anemia 09/18/2012  . Diverticulitis of large intestine with perforation 07/26/2011  . Multiple myeloma in remission ([name]) 10/14/2010  . Neurogenic bowel 10/14/2010  . Paraplegia ([name]) 07/13/2010  .  Neurogenic bladder 07/11/2010  . BACK PAIN, LUMBAR, WITH RADICULOPATHY 07/07/2010  . DISEQUILIBRIUM 07/07/2010  . Abdominal pain, generalized 07/07/2010  . OBESITY, NOS 09/20/2006   Social History   Social History  . Marital status: Married    Spouse name: N/A  . Number of children: N/A  . Years of education: N/A   Occupational History  .  Not on file.   Social History Main Topics  . Smoking status: Never Smoker  . Smokeless tobacco: Never Used  . Alcohol use No  . Drug use: No  . Sexual activity: No   Other Topics Concern  . Not on file   Social History Narrative  . No narrative on file    Mr. [name]'s family history includes Cancer in his mother; Diabetes in his father; Ovarian cancer in his mother.      Objective:    Vitals:   02/15/17 1048  BP: 128/78  Pulse: 78    Physical Exam well-developed African-American male in no acute distress, he is a paraplegic and wheelchair bound. Blood pressure 128/78 pulse 78, BMI 33.2. HEENT;nontraumatic normocephalic EOMI PERRLA sclera anicteric, Cardiovascular; regular rate and rhythm with S1-S2 no murmur or gallop, Pulmonary clear bilaterally, Abdomen; soft, bowel sounds are present , midline incisional scar and a colostomy in the left lower quadrant and also has a suprapubic catheter, Rectal ;exam not Howell, Extremities;  paraplegia, Neuropsych ;mood and affect appropriate       Assessment & Plan:   #55 61 year old African-American male referred for intermittent rectal bleeding in setting of history of perforated diverticulitis and sigmoid colostomy 2013. No previous colonoscopy Rule out occult rectal lesion versus hemorrhoidal bleeding Patient also needs screening colonoscopy #2 paraplegia secondary to multiple myeloma which is now in remission #3 chronic anticoagulation-on [name] #4 history of DVT remote #5 neurogenic bladder # 6 microcytosis  Plan; check iron studies He has not had any prior colonoscopy/colon screening. He will be scheduled for colonoscopy via the colostomy and then also flex via the rectum with Dr. Henrene Howell at the hospital [facility] to paraplegia. He has a colostomy so should be able to handle bowel prep at home. We will need to hold [name] for at least 24 hours prior to his procedure. We will communicate with [name] to assure that holding  anticoagulation is reasonable for this patient. Further plans pending results at colonoscopy.  [name] Gary Harold PA-C 02/15/2017   Cc: [name], MD

## 2017-02-15 NOTE — Progress Notes (Signed)
Assessment, plans, and case reviewed with GI physician assistant in this very complicated gentleman with rectal bleeding

## 2017-02-19 ENCOUNTER — Telehealth: Payer: Self-pay | Admitting: *Deleted

## 2017-02-19 NOTE — Telephone Encounter (Signed)
Spoke to the patient on 02-19-2017.  Advised him to take the Xarelto on 04-09-2017. Then hold it and restart it on 04-14-2017 per Dr. Bradd Burner.  The patient verbalized understanding the Xarelto clearance instructions.

## 2017-03-01 ENCOUNTER — Other Ambulatory Visit (HOSPITAL_BASED_OUTPATIENT_CLINIC_OR_DEPARTMENT_OTHER): Payer: Medicare (Managed Care)

## 2017-03-01 DIAGNOSIS — C9001 Multiple myeloma in remission: Secondary | ICD-10-CM

## 2017-03-01 DIAGNOSIS — D509 Iron deficiency anemia, unspecified: Secondary | ICD-10-CM

## 2017-03-01 LAB — COMPREHENSIVE METABOLIC PANEL
ALT: 35 U/L (ref 0–55)
AST: 28 U/L (ref 5–34)
Albumin: 3.7 g/dL (ref 3.5–5.0)
Alkaline Phosphatase: 91 U/L (ref 40–150)
Anion Gap: 9 mEq/L (ref 3–11)
BUN: 13.7 mg/dL (ref 7.0–26.0)
CO2: 26 mEq/L (ref 22–29)
Calcium: 10.1 mg/dL (ref 8.4–10.4)
Chloride: 106 mEq/L (ref 98–109)
Creatinine: 0.8 mg/dL (ref 0.7–1.3)
EGFR: >90 mL/min/{1.73_m2} (ref >90)
Glucose: 89 mg/dl (ref 70–140)
Potassium: 4 mEq/L (ref 3.5–5.1)
Sodium: 141 mEq/L (ref 136–145)
Total Bilirubin: 0.6 mg/dL (ref 0.20–1.20)
Total Protein: 8.1 g/dL (ref 6.4–8.3)

## 2017-03-01 LAB — CBC WITH DIFFERENTIAL/PLATELET
BASO%: 0.4 % (ref 0.0–2.0)
Basophils Absolute: 0 10*3/uL (ref 0.0–0.1)
EOS%: 4.2 % (ref 0.0–7.0)
Eosinophils Absolute: 0.3 10*3/uL (ref 0.0–0.5)
HCT: 39.3 % (ref 38.4–49.9)
HGB: 13.6 g/dL (ref 13.0–17.1)
LYMPH%: 29.7 % (ref 14.0–49.0)
MCH: 25.9 pg — ABNORMAL LOW (ref 27.2–33.4)
MCHC: 34.6 g/dL (ref 32.0–36.0)
MCV: 74.7 fL — ABNORMAL LOW (ref 79.3–98.0)
MONO#: 0.5 10*3/uL (ref 0.1–0.9)
MONO%: 7 % (ref 0.0–14.0)
NEUT#: 4.5 10*3/uL (ref 1.5–6.5)
NEUT%: 58.7 % (ref 39.0–75.0)
Platelets: 404 10*3/uL — ABNORMAL HIGH (ref 140–400)
RBC: 5.26 10*6/uL (ref 4.20–5.82)
RDW: 16.8 % — ABNORMAL HIGH (ref 11.0–14.6)
WBC: 7.7 10*3/uL (ref 4.0–10.3)
lymph#: 2.3 10*3/uL (ref 0.9–3.3)

## 2017-03-01 LAB — FERRITIN: Ferritin: 206 ng/ml (ref 22–316)

## 2017-03-02 LAB — KAPPA/LAMBDA LIGHT CHAINS
Ig Kappa Free Light Chain: 22.9 mg/L — ABNORMAL HIGH (ref 3.3–19.4)
Ig Lambda Free Light Chain: 15.9 mg/L (ref 5.7–26.3)
Kappa/Lambda FluidC Ratio: 1.44 (ref 0.26–1.65)

## 2017-03-05 LAB — MULTIPLE MYELOMA PANEL, SERUM
Albumin SerPl Elph-Mcnc: 3.5 g/dL (ref 2.9–4.4)
Albumin/Glob SerPl: 0.9 (ref 0.7–1.7)
Alpha 1: 0.3 g/dL (ref 0.0–0.4)
Alpha2 Glob SerPl Elph-Mcnc: 1 g/dL (ref 0.4–1.0)
B-Globulin SerPl Elph-Mcnc: 1.3 g/dL (ref 0.7–1.3)
Gamma Glob SerPl Elph-Mcnc: 1.6 g/dL (ref 0.4–1.8)
Globulin, Total: 4.2 g/dL — ABNORMAL HIGH (ref 2.2–3.9)
IgA, Qn, Serum: 206 mg/dL (ref 61–437)
IgG, Qn, Serum: 1433 mg/dL (ref 700–1600)
IgM, Qn, Serum: 82 mg/dL (ref 20–172)
M Protein SerPl Elph-Mcnc: 0.6 g/dL — ABNORMAL HIGH
Total Protein: 7.7 g/dL (ref 6.0–8.5)

## 2017-03-08 ENCOUNTER — Telehealth: Payer: Self-pay | Admitting: Hematology and Oncology

## 2017-03-08 ENCOUNTER — Ambulatory Visit (HOSPITAL_BASED_OUTPATIENT_CLINIC_OR_DEPARTMENT_OTHER): Payer: Medicare (Managed Care) | Admitting: Hematology and Oncology

## 2017-03-08 ENCOUNTER — Encounter: Payer: Self-pay | Admitting: Hematology and Oncology

## 2017-03-08 DIAGNOSIS — Z86718 Personal history of other venous thrombosis and embolism: Secondary | ICD-10-CM

## 2017-03-08 DIAGNOSIS — C9001 Multiple myeloma in remission: Secondary | ICD-10-CM

## 2017-03-08 NOTE — Assessment & Plan Note (Signed)
Clinically, he has no signs of disease progression.  I discussed the natural history of multiple myeloma with him. At present time he has no evidence of organ damage. I recommend repeat blood work, history and physical examination in 1 year.  The patient is educated to watch out for signs and symptoms of disease recurrence.  he will continue vitamin D supplement

## 2017-03-08 NOTE — Assessment & Plan Note (Signed)
He had placement of IVC filter remain on Xarelto. Due to poor mobility, and lack of bleeding complications, I recommend he remain on Xarelto indefinitely.

## 2017-03-08 NOTE — Progress Notes (Addendum)
[place] OFFICE PROGRESS NOTE  Patient Care Team: [name], MD as PCP - General (Family Medicine) [name], MD (Inactive) as Consulting Physician (Hematology and Oncology) [name], MD as Consulting Physician (Physical Medicine and Rehabilitation) Donia Guiles Lavon Paganini, PA-C as Physician Assistant (Physical Medicine and Rehabilitation) [name], MD as Consulting Physician (Hematology and Oncology)  SUMMARY OF ONCOLOGIC HISTORY:  1. 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 The patient underwent biopsy and cord decompression by Dr. Dierdre Harness on 07/15/2010. He underwent radiation treatments which concluded by 08/22/2010. The patient received treatment with Revlimid, Decadron, and Velcade in combination with Zometa. His initial IgG levels were as high as 5570 back in February 2012. Initial 24-hour urine protein came to 318 mg, positive for IgG heavy chains and lambda light chains. I do not think the patient ever had a bone marrow or a metastatic bone survey; however, CT scans of abdomen and pelvis carried out on 12/09/2010 have shown myelomatous involvement of the bones. A CT scan of the head on 05/05/2011 was normal. The patient has had an  outstanding response to treatment. IgG level from 09/11/2012 was 904. Serum immunofixation electrophoresis continues to show monoclonal IgG lambda protein. It appears that Velcade was discontinued in September 2012 and that Revlimid and Decadron were discontinued in March 2013. 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 The patient is presently on no specific treatment for his IgG lambda multiple myeloma.  2. Paraplegia which developed in December 2011. The patient is status post surgical decompression of the T8  paraspinal mass by Dr. Erline Levine. He is also status post radiation treatments. The patient had chronic neurogenic bladder and bowel. He has placement of suprapubic catheter. 3. 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 He had an inferior vena cava filter had been placed on November 10, 2010 and he has been on Xarelto since 06/12/2012.  4. 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  5. He also has adrenal insufficiency on chronic prednisone therapy complicated by diabetes mellitus.  6.He has chronic anemia noted since 2014 treated with IV Feraheme and had infusion reactions.  7. 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 Isac Caddy.   INTERVAL HISTORY: Please see below for problem oriented charting. He returns for further follow-up He denies hematuria He had mild intermittent hematochezia with plan for colonoscopy in the near future His blood work did not show evidence of iron deficiency anemia He denies new bone pain Overall, he feels well Denies recurrent infection.  REVIEW OF SYSTEMS:   Constitutional: Denies fevers, chills or abnormal weight loss Eyes: Denies blurriness of vision Ears, nose, mouth, throat, and face: Denies mucositis or sore throat Respiratory: Denies cough, dyspnea or wheezes Cardiovascular: Denies palpitation, chest discomfort or lower extremity swelling Gastrointestinal:  Denies nausea, heartburn or change in bowel habits Skin: Denies abnormal skin rashes Lymphatics: Denies new lymphadenopathy or easy bruising Neurological:Denies numbness, tingling or new weaknesses Behavioral/Psych: Mood is stable, no new changes  All other systems were reviewed with the patient and are negative.  I have reviewed the past medical history, past surgical history,  social  history and family history with the patient and they are unchanged from previous note.  ALLERGIES:  is allergic to feraheme [ferumoxytol].  MEDICATIONS:  Current Outpatient Prescriptions  Medication Sig Dispense Refill  . acetaminophen (TYLENOL) 325 MG tablet Take 325-650 mg by mouth every 6 (six) hours as needed. For pain.    . brinzolamide (AZOPT) 1 % ophthalmic suspension Place 1 drop into both eyes 2 (two) times daily.    . cholecalciferol (VITAMIN D) 1000 UNITS tablet Take 1,000 Units by mouth daily.    Marland Kitchen dexamethasone (DECADRON) 1 MG tablet Take 1 mg by mouth daily with breakfast.    . latanoprost (XALATAN) 0.005 % ophthalmic solution Place 1 drop into both eyes at bedtime.    . metFORMIN (GLUCOPHAGE) 500 MG tablet Take 500 mg by mouth 2 (two) times daily with a meal.    . methocarbamol (ROBAXIN) 500 MG tablet Take 500 mg by mouth every 6 (six) hours as needed. For muscle spasms.    . Rivaroxaban (XARELTO) 20 MG TABS tablet Take 20 mg by mouth daily with supper.    . Skin Protectants, Misc. (EUCERIN) cream Apply topically as needed for dry skin.    Marland Kitchen zinc oxide (BALMEX) 11.3 % CREA cream Apply 1 application topically 2 (two) times daily.     No current facility-administered medications for this visit.     PHYSICAL EXAMINATION: ECOG PERFORMANCE STATUS: 2 - Symptomatic, <50% confined to bed  Vitals:   03/08/17 0933  BP: 121/70  Pulse: 75  Resp: 18  Temp: 98.7 F (37.1 C)  SpO2: 100%   There were no vitals filed for this visit.  GENERAL:alert, no distress and comfortable.  Limited examination as the patient is sitting on the wheelchair SKIN: skin color, texture, turgor are normal, no rashes or significant lesions EYES: normal, Conjunctiva are pink and non-injected, sclera clear OROPHARYNX:no exudate, no erythema and lips, buccal mucosa, and tongue normal  NECK: supple, thyroid normal size, non-tender, without nodularity LYMPH:  no palpable lymphadenopathy in the  cervical, axillary or inguinal LUNGS: clear to auscultation and percussion with normal breathing effort HEART: regular rate & rhythm and no murmurs and no lower extremity edema ABDOMEN:abdomen soft, non-tender and normal bowel sounds Musculoskeletal:no cyanosis of digits and no clubbing  NEURO: alert & oriented x 3 with fluent speech  LABORATORY DATA:  I have reviewed the data as listed    Component Value Date/Time   NA 141 03/01/2017 1133   K 4.0 03/01/2017 1133   CL 108 07/24/2014 0619   CL 104 11/15/2012 0823   CO2 26 03/01/2017 1133   GLUCOSE 89 03/01/2017 1133   GLUCOSE 141 (H) 11/15/2012 0823   BUN 13.7 03/01/2017 1133   CREATININE 0.8 03/01/2017 1133   CALCIUM 10.1 03/01/2017 1133   PROT 7.7 03/01/2017 1133   PROT 8.1 03/01/2017 1133   ALBUMIN 3.7 03/01/2017 1133   AST 28 03/01/2017 1133   ALT 35 03/01/2017 1133   ALKPHOS 91 03/01/2017 1133   BILITOT 0.60 03/01/2017 1133   GFRNONAA >90 07/24/2014 0619   GFRAA >90 07/24/2014 0619    No results found for: SPEP, UPEP  Lab Results  Component Value Date   WBC 7.7 03/01/2017   NEUTROABS 4.5 03/01/2017   HGB 13.6 03/01/2017   HCT 39.3 03/01/2017   MCV 74.7 (L) 03/01/2017   PLT 404 (H) 03/01/2017      Chemistry      Component Value Date/Time   NA 141 03/01/2017 1133  K 4.0 03/01/2017 1133   CL 108 07/24/2014 0619   CL 104 11/15/2012 0823   CO2 26 03/01/2017 1133   BUN 13.7 03/01/2017 1133   CREATININE 0.8 03/01/2017 1133   GLU 166 (H) 09/19/2010 1538      Component Value Date/Time   CALCIUM 10.1 03/01/2017 1133   ALKPHOS 91 03/01/2017 1133   AST 28 03/01/2017 1133   ALT 35 03/01/2017 1133   BILITOT 0.60 03/01/2017 1133      ASSESSMENT & PLAN:  Multiple myeloma in remission ([place]) Clinically, he has no signs of disease progression.  I discussed the natural history of multiple myeloma with him. At present time he has no evidence of organ damage. I recommend repeat blood work, history and physical  examination in 1 year.  The patient is educated to watch out for signs and symptoms of disease recurrence.  he will continue vitamin D supplement  History of DVT (deep vein thrombosis) He had placement of IVC filter remain on Xarelto. Due to poor mobility, and lack of bleeding complications, I recommend he remain on Xarelto indefinitely.   Orders Placed This Encounter  Procedures  . Comprehensive metabolic panel    Standing Status:   Future    Standing Expiration Date:   04/12/2018  . CBC with Differential/Platelet    Standing Status:   Future    Standing Expiration Date:   04/12/2018  . Ferritin    Standing Status:   Future    Standing Expiration Date:   04/12/2018  . Kappa/lambda light chains    Standing Status:   Future    Standing Expiration Date:   04/12/2018  . Multiple Myeloma Panel (SPEP&IFE w/QIG)    Standing Status:   Future    Standing Expiration Date:   04/12/2018   All questions were answered. The patient knows to call the clinic with any problems, questions or concerns. No barriers to learning was detected. I spent 10 minutes counseling the patient face to face. The total time spent in the appointment was 12 minutes and more than 50% was on counseling and review of test results     [name], MD 03/08/2017 10:08 AM

## 2017-03-08 NOTE — Telephone Encounter (Signed)
Gave patient avs and calendar for appts.

## 2017-03-20 ENCOUNTER — Ambulatory Visit
Admission: RE | Admit: 2017-03-20 | Discharge: 2017-03-20 | Disposition: A | Discharge status: Home / Self Care | Payer: Medicare (Managed Care) | Source: Ambulatory Visit | Attending: Nurse Practitioner | Admitting: Nurse Practitioner

## 2017-03-20 ENCOUNTER — Other Ambulatory Visit: Payer: Self-pay | Admitting: Nurse Practitioner

## 2017-03-20 DIAGNOSIS — R059 Cough, unspecified: Secondary | ICD-10-CM

## 2017-03-20 DIAGNOSIS — R05 Cough: Secondary | ICD-10-CM

## 2017-03-22 ENCOUNTER — Encounter (HOSPITAL_COMMUNITY): Payer: Self-pay

## 2017-04-09 ENCOUNTER — Telehealth: Payer: Self-pay | Admitting: *Deleted

## 2017-04-09 NOTE — Telephone Encounter (Signed)
Called [facility], spoke to [place]. Advised them this patient has a colonoscopy/Endoscopy scheduled at [facility] Endoscopy unit, 1st floor. Patient has to arrive at 8:00 am.  She they do have him on their schedule for this. She thanked me for calling.

## 2017-04-12 ENCOUNTER — Encounter (HOSPITAL_COMMUNITY)
Admission: RE | Disposition: A | Discharge status: Home / Self Care | Payer: Self-pay | Source: Ambulatory Visit | Attending: Internal Medicine

## 2017-04-12 ENCOUNTER — Encounter (HOSPITAL_COMMUNITY): Payer: Self-pay

## 2017-04-12 ENCOUNTER — Ambulatory Visit (HOSPITAL_COMMUNITY): Payer: Medicare (Managed Care) | Admitting: Certified Registered Nurse Anesthetist

## 2017-04-12 ENCOUNTER — Ambulatory Visit (HOSPITAL_COMMUNITY)
Admission: RE | Admit: 2017-04-12 | Discharge: 2017-04-12 | Disposition: A | Discharge status: Home / Self Care | Payer: Medicare (Managed Care) | Source: Ambulatory Visit | Attending: Internal Medicine | Admitting: Internal Medicine

## 2017-04-12 DIAGNOSIS — E119 Type 2 diabetes mellitus without complications: Secondary | ICD-10-CM | POA: Insufficient documentation

## 2017-04-12 DIAGNOSIS — Z9049 Acquired absence of other specified parts of digestive tract: Secondary | ICD-10-CM | POA: Insufficient documentation

## 2017-04-12 DIAGNOSIS — Z1211 Encounter for screening for malignant neoplasm of colon: Secondary | ICD-10-CM | POA: Diagnosis present

## 2017-04-12 DIAGNOSIS — Z6834 Body mass index (BMI) 34.0-34.9, adult: Secondary | ICD-10-CM | POA: Diagnosis not present

## 2017-04-12 DIAGNOSIS — Z8719 Personal history of other diseases of the digestive system: Secondary | ICD-10-CM | POA: Insufficient documentation

## 2017-04-12 DIAGNOSIS — K625 Hemorrhage of anus and rectum: Secondary | ICD-10-CM | POA: Diagnosis not present

## 2017-04-12 DIAGNOSIS — G8220 Paraplegia, unspecified: Secondary | ICD-10-CM | POA: Insufficient documentation

## 2017-04-12 DIAGNOSIS — Z888 Allergy status to other drugs, medicaments and biological substances status: Secondary | ICD-10-CM | POA: Diagnosis not present

## 2017-04-12 DIAGNOSIS — Z7901 Long term (current) use of anticoagulants: Secondary | ICD-10-CM | POA: Insufficient documentation

## 2017-04-12 DIAGNOSIS — D122 Benign neoplasm of ascending colon: Secondary | ICD-10-CM | POA: Diagnosis not present

## 2017-04-12 DIAGNOSIS — N319 Neuromuscular dysfunction of bladder, unspecified: Secondary | ICD-10-CM | POA: Insufficient documentation

## 2017-04-12 DIAGNOSIS — Z7984 Long term (current) use of oral hypoglycemic drugs: Secondary | ICD-10-CM | POA: Insufficient documentation

## 2017-04-12 DIAGNOSIS — Z79899 Other long term (current) drug therapy: Secondary | ICD-10-CM | POA: Insufficient documentation

## 2017-04-12 DIAGNOSIS — E669 Obesity, unspecified: Secondary | ICD-10-CM | POA: Insufficient documentation

## 2017-04-12 DIAGNOSIS — Z86718 Personal history of other venous thrombosis and embolism: Secondary | ICD-10-CM | POA: Diagnosis not present

## 2017-04-12 DIAGNOSIS — Z933 Colostomy status: Secondary | ICD-10-CM | POA: Diagnosis not present

## 2017-04-12 DIAGNOSIS — C9001 Multiple myeloma in remission: Secondary | ICD-10-CM | POA: Insufficient documentation

## 2017-04-12 HISTORY — PX: COLONOSCOPY WITH PROPOFOL: SHX5780

## 2017-04-12 LAB — GLUCOSE, CAPILLARY: Glucose-Capillary: 109 mg/dL — ABNORMAL HIGH (ref 65–99)

## 2017-04-12 SURGERY — COLONOSCOPY WITH PROPOFOL
Anesthesia: Monitor Anesthesia Care

## 2017-04-12 MED ORDER — PROPOFOL 10 MG/ML IV BOLUS
INTRAVENOUS | Status: DC | PRN
  Administered 2017-04-12 (×2): 20 mg via INTRAVENOUS

## 2017-04-12 MED ORDER — LIDOCAINE 2% (20 MG/ML) 5 ML SYRINGE
INTRAMUSCULAR | Status: AC
  Filled 2017-04-12: qty 5

## 2017-04-12 MED ORDER — SODIUM CHLORIDE 0.9 % IV SOLN: INTRAVENOUS | Status: DC

## 2017-04-12 MED ORDER — ONDANSETRON HCL 4 MG/2ML IJ SOLN
INTRAMUSCULAR | Status: AC
  Filled 2017-04-12: qty 2

## 2017-04-12 MED ORDER — PROPOFOL 500 MG/50ML IV EMUL
INTRAVENOUS | Status: DC | PRN
  Administered 2017-04-12: 50 ug/kg/min via INTRAVENOUS

## 2017-04-12 MED ORDER — PROPOFOL 10 MG/ML IV BOLUS
INTRAVENOUS | Status: AC
  Filled 2017-04-12: qty 40

## 2017-04-12 MED ORDER — LACTATED RINGERS IV SOLN
INTRAVENOUS | Status: DC | PRN
  Administered 2017-04-12: 10:00:00 via INTRAVENOUS

## 2017-04-12 MED ORDER — DEXAMETHASONE SODIUM PHOSPHATE 10 MG/ML IJ SOLN
INTRAMUSCULAR | Status: AC
  Filled 2017-04-12: qty 1

## 2017-04-12 MED ORDER — DEXAMETHASONE SODIUM PHOSPHATE 10 MG/ML IJ SOLN
INTRAMUSCULAR | Status: DC | PRN
  Administered 2017-04-12: 5 mg via INTRAVENOUS

## 2017-04-12 MED ORDER — ONDANSETRON HCL 4 MG/2ML IJ SOLN
INTRAMUSCULAR | Status: DC | PRN
  Administered 2017-04-12: 4 mg via INTRAVENOUS

## 2017-04-12 MED ORDER — LIDOCAINE 2% (20 MG/ML) 5 ML SYRINGE
INTRAMUSCULAR | Status: DC | PRN
  Administered 2017-04-12: 100 mg via INTRAVENOUS

## 2017-04-12 SURGICAL SUPPLY — 22 items

## 2017-04-12 NOTE — Anesthesia Postprocedure Evaluation (Signed)
Anesthesia Post Note  Patient: [name]  Procedure(s) Performed: Procedure(s) (LRB): COLONOSCOPY WITH PROPOFOL (N/A)     Patient location during evaluation: PACU Anesthesia Type: MAC Level of consciousness: awake Pain management: pain level controlled Vital Signs Assessment: post-procedure vital signs reviewed and stable Respiratory status: spontaneous breathing Cardiovascular status: stable Anesthetic complications: no    Last Vitals:  Vitals:   04/12/17 1105 04/12/17 1110  BP: 114/79 111/72  Pulse: 76 66  Resp: (!) 21 16  Temp:    SpO2: 100% 100%    Last Pain:  Vitals:   04/12/17 0833  TempSrc: Oral                 Axell Trigueros

## 2017-04-12 NOTE — Discharge Instructions (Signed)

## 2017-04-12 NOTE — Transfer of Care (Signed)
Immediate Anesthesia Transfer of Care Note  Patient: [name]  Procedure(s) Performed: Procedure(s): COLONOSCOPY WITH PROPOFOL (N/A)  Patient Location: PACU  Anesthesia Type:MAC  Level of Consciousness:  sedated, patient cooperative and responds to stimulation  Airway & Oxygen Therapy:Patient Spontanous Breathing and Patient connected to face mask oxgen  Post-op Assessment:  Report given to PACU RN and Post -op Vital signs reviewed and stable  Post vital signs:  Reviewed and stable  Last Vitals:  Vitals:   04/12/17 0833  BP: (!) 141/94  Pulse: 91  Resp: 15  Temp: 36.5 C  SpO2: 19%    Complications: No apparent anesthesia complications

## 2017-04-12 NOTE — H&P (Signed)
[name] Howell is a pleasant 61 year old African-American male, new to GI today [name] for [facility] of intermittent rectal bleeding. Patient has history of multiple myeloma which was complicated by a paraplegia in 2013. He is currently in remission. He has had complication of neurogenic bladder. He also has history of DVT which she says was a couple of years ago and has been maintained on Xarelto since. Patient underwent an emergent laparotomy and sigmoid colostomy in 2013 per [name] Howell for perforated diverticulitis and has a permanent colostomy. Patient states he has not had any previous GI evaluation and no prior colonoscopy. He says his colostomy functions well and he has not noted any melena or hematochezia. The ostomy. Is no complaints of abdominal pain appetite has been fine and weight has been stable. His caregivers have noted that he has oozing of blood from his rectum intermittently, and patient says this has been occurring for quite a while. He has no rectal sensation and therefore no complaints of discomfort etc. He is uncertain about family history but feels that his mother may have had colon cancer. Most recent labs April 2018 hemoglobin 13.4 hematocrit of 40 and MCV of 78 and MCH of 26 creatinine 0.94 LFTs within normal limits.  Review of Systems Pertinent positive and negative review of systems were noted in the above HPI section.  All other review of systems was otherwise negative.      Outpatient Encounter Prescriptions as of 02/15/2017  Medication Sig  . acetaminophen (TYLENOL) 325 MG tablet Take 325-650 mg by mouth every 6 (six) hours as needed. For pain.  . brinzolamide (AZOPT) 1 % ophthalmic suspension Place 1 drop into both eyes 2 (two) times daily.  . cholecalciferol (VITAMIN D) 1000 UNITS tablet Take 1,000 Units by mouth daily.  Marland Kitchen dexamethasone (DECADRON) 1 MG tablet Take 1 mg by mouth daily with breakfast.  . latanoprost (XALATAN) 0.005 %  ophthalmic solution Place 1 drop into both eyes at bedtime.  . metFORMIN (GLUCOPHAGE) 500 MG tablet Take 500 mg by mouth 2 (two) times daily with a meal.  . methocarbamol (ROBAXIN) 500 MG tablet Take 500 mg by mouth every 6 (six) hours as needed. For muscle spasms.  . Rivaroxaban (XARELTO) 20 MG TABS tablet Take 20 mg by mouth daily with supper.  . Skin Protectants, Misc. (EUCERIN) cream Apply topically as needed for dry skin.  Marland Kitchen zinc oxide (BALMEX) 11.3 % CREA cream Apply 1 application topically 2 (two) times daily.   No facility-administered encounter medications on file as of 02/15/2017.        Allergies  Allergen Reactions  . Feraheme [Ferumoxytol]        Patient Active Problem List   Diagnosis Date Noted  . History of DVT (deep vein thrombosis) 06/10/2015  . Obstructed suprapubic catheter ([place]) 07/21/2014  . Bilateral hydronephrosis 07/21/2014  . Acute kidney failure ([place]) 07/20/2014  . Abdominal pain 07/20/2014  . AKI (acute kidney injury) ([place]) 07/20/2014  . Hematuria 02/12/2013  . Shock ([place]) 02/12/2013  . Adrenal insufficiency ([place]) 02/12/2013  . Hypoalbuminemia 11/15/2012  . Anemia, unspecified 09/18/2012  . Unspecified vitamin D deficiency 09/18/2012  . 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 Iron deficiency anemia 09/18/2012  . Diverticulitis of large intestine with perforation 07/26/2011  . Multiple myeloma in remission ([place]) 10/14/2010  . Neurogenic bowel 10/14/2010  . Paraplegia ([place]) 07/13/2010  . Neurogenic bladder 07/11/2010  . BACK PAIN, LUMBAR, WITH RADICULOPATHY 07/07/2010  .  DISEQUILIBRIUM 07/07/2010  . Abdominal pain, generalized 07/07/2010  . OBESITY, NOS 09/20/2006   Social History        Social History  . Marital status: Married    Spouse name: N/A  . Number of children: N/A  . Years of education: N/A      Occupational History  . Not on file.       Social History Main Topics  . Smoking status:  Never Smoker  . Smokeless tobacco: Never Used  . Alcohol use No  . Drug use: No  . Sexual activity: No       Other Topics Concern  . Not on file      Social History Narrative  . No narrative on file    [name] Howell's family history includes Cancer in his mother; Diabetes in his father; Ovarian cancer in his mother.      Objective:     Vitals:   02/15/17 1048  BP: 128/78  Pulse: 78    Physical Exam well-developed African-American male in no acute distress, he is a paraplegic and wheelchair bound. Blood pressure 128/78 pulse 78, BMI 33.2. HEENT;nontraumatic normocephalic EOMI PERRLA sclera anicteric, Cardiovascular; regular rate and rhythm with S1-S2 no murmur or gallop, Pulmonary clear bilaterally, Abdomen; soft, bowel sounds are present , midline incisional scar and a colostomy in the left lower quadrant and also has a suprapubic catheter, Rectal ;exam not Howell, Extremities;  paraplegia, Neuropsych ;mood and affect appropriate       Assessment & Plan:   #64 61 year old African-American male referred for intermittent rectal bleeding in setting of history of perforated diverticulitis and sigmoid colostomy 2013. No previous colonoscopy Rule out occult rectal lesion versus hemorrhoidal bleeding Patient also needs screening colonoscopy #2 paraplegia secondary to multiple myeloma which is now in remission #3 chronic anticoagulation-on Xarelto #4 history of DVT remote #5 neurogenic bladder # 6 microcytosis  Plan; check iron studies He has not had any prior colonoscopy/colon screening. He will be scheduled for colonoscopy via the colostomy and then also flex via the rectum with [name] [facility] at the hospital [facility] to paraplegia. He has a colostomy so should be able to handle bowel prep at home. We will need to hold Xarelto for at least 24 hours prior to his procedure. We will communicate with [name] Howell to assure that holding anticoagulation is reasonable for this  patient. Further plans pending results at colonoscopy.  Since the above office note, no interval change. Now for colonoscopy to provide colon cancer screening and evaluate rectal bleeding per rectal stump.The nature of the procedure, as well as the risks, benefits, and alternatives were carefully and thoroughly reviewed with the patient. Ample time for discussion and questions allowed. The patient understood, was satisfied, and agreed to proceed.  Docia Chuck. Geri Seminole., M.D. [facility] Division of Gastroenterology.

## 2017-04-12 NOTE — Anesthesia Preprocedure Evaluation (Addendum)
Anesthesia Evaluation  Patient identified by MRN, date of birth, ID band Patient awake    Reviewed: Allergy & Precautions, NPO status , Patient's Chart, lab work & pertinent test results  Airway Mallampati: II  TM Distance: >3 FB     Dental   Pulmonary    breath sounds clear to auscultation       Cardiovascular negative cardio ROS   Rhythm:Regular Rate:Normal     Neuro/Psych    GI/Hepatic negative GI ROS, Neg liver ROS,   Endo/Other  diabetes  Renal/GU Renal disease     Musculoskeletal   Abdominal   Peds  Hematology  (+) anemia ,   Anesthesia Other Findings   Reproductive/Obstetrics                             Anesthesia Physical Anesthesia Plan  ASA: III  Anesthesia Plan: MAC   Post-op Pain Management:    Induction:   PONV Risk Score and Plan: 1 and Ondansetron and Dexamethasone  Airway Management Planned: Simple Face Mask  Additional Equipment:   Intra-op Plan:   Post-operative Plan:   Informed Consent:   Dental advisory given  Plan Discussed with: CRNA and Anesthesiologist  Anesthesia Plan Comments:         Anesthesia Quick Evaluation

## 2017-04-12 NOTE — Op Note (Signed)
[facility] Patient Name: [name] Procedure Date: 04/12/2017 MRN: [id number] Attending MD: Docia Chuck. Henrene Pastor , MD Date of Birth: [birth year] CSN: [id number] Age: 61 Admit Type: Outpatient Procedure:                Colonoscopy, with hot snare polypectomy x 1 Indications:              Screening for colorectal malignant neoplasm. Status                            post sigmoid colectomy with permanent colostomy for                            diverticular disease. Complaints of rectal bleeding                            per rectal stump Providers:                Docia Chuck. Henrene Pastor, MD, Cleda Daub, RN, Cherylynn Ridges, Technician, Cathe Mons, CRNA Referring MD:             [name], M.D. Medicines:                Monitored Anesthesia Care Complications:            No immediate complications. Estimated blood loss:                            None. Estimated Blood Loss:     Estimated blood loss: none. Procedure:                Pre-Anesthesia Assessment:                           - Prior to the procedure, a History and Physical                            was performed, and patient medications and                            allergies were reviewed. The patient's tolerance of                            previous anesthesia was also reviewed. The risks                            and benefits of the procedure and the sedation                            options and risks were discussed with the patient.                            All questions were answered, and informed consent  was obtained. Prior Anticoagulants: The patient has                            taken Xarelto (rivaroxaban), last dose was 2 days                            prior to procedure. ASA Grade Assessment: III - A                            patient with severe systemic disease. After                            reviewing the risks and benefits, the  patient was                            deemed in satisfactory condition to undergo the                            procedure.                           After obtaining informed consent, the colonoscope                            was passed under direct vision. Throughout the                            procedure, the patient's blood pressure, pulse, and                            oxygen saturations were monitored continuously. The                            EC-[id number] ([id number]) scope was introduced through                            the anus and advanced to the the cecum, identified                            by appendiceal orifice and ileocecal valve. The                            ileocecal valve, appendiceal orifice, and rectum                            were photographed. The quality of the bowel                            preparation was excellent. The colonoscopy was                            performed without difficulty. The patient tolerated  the procedure well. The bowel preparation used was                            SUPREP. Scope In: 10:34:10 AM Scope Out: 10:56:04 AM Scope Withdrawal Time: 0 hours 9 minutes 23 seconds  Total Procedure Duration: 0 hours 21 minutes 54 seconds  Findings:      The initial exam was per rectum. Scope was advanced to the end       anastomosis (approximately 20 cm). There was mucous plug which could be       worked around. The underlying mucosa was normal. Retroflexed view not       performed secondary to anatomy. However, excellent view antegrade from       the anal os revealed no significant abnormalities.      After completing the survey of the diverted colon, the pediatric       colonoscope was passed into the ostomy in the left abdomen. The residual       left colon, transverse colon, right colon, and cecum were carefully       evaluated.A 15 mm polyp was found in the ascending colon. The polyp was       pedunculated.  The polyp was removed with a hot snare. Resection and       retrieval were complete.      The exam was otherwise without abnormality . Impression:               - Unremarkable rectal/distal sigmoid stump (20 cm                            segment).                           - One 15 mm polyp in the ascending colon, removed                            with a hot snare. Resected and retrieved.                           - The examination was otherwise normal. Moderate Sedation:      none Recommendation:           - Repeat colonoscopy in 3 years for surveillance.                           - Resume Xarelto (rivaroxaban) today at prior dose.                           - Patient has a contact number available for                            emergencies. The signs and symptoms of potential                            delayed complications were discussed with the                            patient. Return to normal activities tomorrow.  Written discharge instructions were provided to the                            patient.                           - Resume previous diet.                           - Continue present medications.                           - Await pathology results. We will send you a                            letter with the results. Procedure Code(s):        --- Professional ---                           [phone], Colonoscopy, flexible; with removal of                            tumor(s), polyp(s), or other lesion(s) by snare                            technique Diagnosis Code(s):        --- Professional ---                           Z12.11, Encounter for screening for malignant                            neoplasm of colon                           D12.2, Benign neoplasm of ascending colon CPT copyright 2016 American Medical Association. All rights reserved. The codes documented in this report are preliminary and upon coder review may  be revised to meet  current compliance requirements. Docia Chuck. Henrene Pastor, MD 04/12/2017 11:12:11 AM This report has been signed electronically. Number of Addenda: 0

## 2017-04-13 ENCOUNTER — Encounter (HOSPITAL_COMMUNITY): Payer: Self-pay | Admitting: Internal Medicine

## 2017-04-13 ENCOUNTER — Encounter: Payer: Self-pay | Admitting: Internal Medicine

## 2017-04-18 ENCOUNTER — Inpatient Hospital Stay (HOSPITAL_COMMUNITY)
Admission: EM | Admit: 2017-04-18 | Discharge: 2017-04-22 | DRG: 920 | Disposition: A | Discharge status: Home / Self Care | Payer: Medicare (Managed Care) | Attending: Internal Medicine | Admitting: Internal Medicine

## 2017-04-18 ENCOUNTER — Encounter (HOSPITAL_COMMUNITY): Payer: Self-pay | Admitting: Emergency Medicine

## 2017-04-18 DIAGNOSIS — K922 Gastrointestinal hemorrhage, unspecified: Secondary | ICD-10-CM

## 2017-04-18 DIAGNOSIS — Z23 Encounter for immunization: Secondary | ICD-10-CM | POA: Diagnosis present

## 2017-04-18 DIAGNOSIS — M87180 Osteonecrosis due to drugs, jaw: Secondary | ICD-10-CM | POA: Diagnosis present

## 2017-04-18 DIAGNOSIS — N319 Neuromuscular dysfunction of bladder, unspecified: Secondary | ICD-10-CM | POA: Diagnosis present

## 2017-04-18 DIAGNOSIS — Z9350 Unspecified cystostomy status: Secondary | ICD-10-CM

## 2017-04-18 DIAGNOSIS — Z7901 Long term (current) use of anticoagulants: Secondary | ICD-10-CM

## 2017-04-18 DIAGNOSIS — E119 Type 2 diabetes mellitus without complications: Secondary | ICD-10-CM | POA: Diagnosis present

## 2017-04-18 DIAGNOSIS — G8220 Paraplegia, unspecified: Secondary | ICD-10-CM | POA: Diagnosis present

## 2017-04-18 DIAGNOSIS — D649 Anemia, unspecified: Secondary | ICD-10-CM | POA: Diagnosis not present

## 2017-04-18 DIAGNOSIS — Z9889 Other specified postprocedural states: Secondary | ICD-10-CM | POA: Diagnosis not present

## 2017-04-18 DIAGNOSIS — C9000 Multiple myeloma not having achieved remission: Secondary | ICD-10-CM | POA: Diagnosis present

## 2017-04-18 DIAGNOSIS — E669 Obesity, unspecified: Secondary | ICD-10-CM | POA: Diagnosis present

## 2017-04-18 DIAGNOSIS — Z6836 Body mass index (BMI) 36.0-36.9, adult: Secondary | ICD-10-CM

## 2017-04-18 DIAGNOSIS — K633 Ulcer of intestine: Secondary | ICD-10-CM | POA: Diagnosis present

## 2017-04-18 DIAGNOSIS — Z79899 Other long term (current) drug therapy: Secondary | ICD-10-CM

## 2017-04-18 DIAGNOSIS — K91840 Postprocedural hemorrhage and hematoma of a digestive system organ or structure following a digestive system procedure: Secondary | ICD-10-CM | POA: Diagnosis present

## 2017-04-18 DIAGNOSIS — N390 Urinary tract infection, site not specified: Secondary | ICD-10-CM | POA: Diagnosis not present

## 2017-04-18 DIAGNOSIS — Z86010 Personal history of colonic polyps: Secondary | ICD-10-CM | POA: Diagnosis not present

## 2017-04-18 DIAGNOSIS — Z86718 Personal history of other venous thrombosis and embolism: Secondary | ICD-10-CM

## 2017-04-18 DIAGNOSIS — D62 Acute posthemorrhagic anemia: Secondary | ICD-10-CM | POA: Diagnosis present

## 2017-04-18 DIAGNOSIS — Y838 Other surgical procedures as the cause of abnormal reaction of the patient, or of later complication, without mention of misadventure at the time of the procedure: Secondary | ICD-10-CM | POA: Diagnosis present

## 2017-04-18 DIAGNOSIS — Z7984 Long term (current) use of oral hypoglycemic drugs: Secondary | ICD-10-CM

## 2017-04-18 DIAGNOSIS — E2740 Unspecified adrenocortical insufficiency: Secondary | ICD-10-CM | POA: Diagnosis present

## 2017-04-18 DIAGNOSIS — C9002 Multiple myeloma in relapse: Secondary | ICD-10-CM | POA: Diagnosis present

## 2017-04-18 DIAGNOSIS — Z933 Colostomy status: Secondary | ICD-10-CM | POA: Diagnosis not present

## 2017-04-18 DIAGNOSIS — E1169 Type 2 diabetes mellitus with other specified complication: Secondary | ICD-10-CM | POA: Diagnosis present

## 2017-04-18 DIAGNOSIS — K625 Hemorrhage of anus and rectum: Secondary | ICD-10-CM | POA: Diagnosis not present

## 2017-04-18 DIAGNOSIS — K921 Melena: Secondary | ICD-10-CM | POA: Diagnosis not present

## 2017-04-18 DIAGNOSIS — C9001 Multiple myeloma in remission: Secondary | ICD-10-CM | POA: Diagnosis present

## 2017-04-18 HISTORY — DX: Gastrointestinal hemorrhage, unspecified: K92.2

## 2017-04-18 LAB — CBC
HCT: 36.3 % — ABNORMAL LOW (ref 39.0–52.0)
HCT: 34.8 % — ABNORMAL LOW (ref 39.0–52.0)
Hemoglobin: 12.8 g/dL — ABNORMAL LOW (ref 13.0–17.0)
Hemoglobin: 12.3 g/dL — ABNORMAL LOW (ref 13.0–17.0)
MCH: 25.3 pg — ABNORMAL LOW (ref 26.0–34.0)
MCH: 25.5 pg — ABNORMAL LOW (ref 26.0–34.0)
MCHC: 35.3 g/dL (ref 30.0–36.0)
MCHC: 35.3 g/dL (ref 30.0–36.0)
MCV: 71.9 fL — ABNORMAL LOW (ref 78.0–100.0)
MCV: 72.2 fL — ABNORMAL LOW (ref 78.0–100.0)
Platelets: 374 10*3/uL (ref 150–400)
Platelets: 355 10*3/uL (ref 150–400)
RBC: 5.05 MIL/uL (ref 4.22–5.81)
RBC: 4.82 MIL/uL (ref 4.22–5.81)
RDW: 16.6 % — ABNORMAL HIGH (ref 11.5–15.5)
RDW: 16.5 % — ABNORMAL HIGH (ref 11.5–15.5)
WBC: 12.2 10*3/uL — ABNORMAL HIGH (ref 4.0–10.5)
WBC: 12.8 10*3/uL — ABNORMAL HIGH (ref 4.0–10.5)

## 2017-04-18 LAB — COMPREHENSIVE METABOLIC PANEL
ALT: 23 U/L (ref 17–63)
AST: 23 U/L (ref 15–41)
Albumin: 3.8 g/dL (ref 3.5–5.0)
Alkaline Phosphatase: 71 U/L (ref 38–126)
Anion gap: 10 (ref 5–15)
BUN: 15 mg/dL (ref 6–20)
CO2: 23 mmol/L (ref 22–32)
Calcium: 9.4 mg/dL (ref 8.9–10.3)
Chloride: 105 mmol/L (ref 101–111)
Creatinine, Ser: 0.82 mg/dL (ref 0.61–1.24)
GFR calc Af Amer: >60 mL/min (ref >60)
GFR calc non Af Amer: >60 mL/min (ref >60)
Glucose, Bld: 113 mg/dL — ABNORMAL HIGH (ref 65–99)
Potassium: 3.8 mmol/L (ref 3.5–5.1)
Sodium: 138 mmol/L (ref 135–145)
Total Bilirubin: 0.3 mg/dL (ref 0.3–1.2)
Total Protein: 7.7 g/dL (ref 6.5–8.1)

## 2017-04-18 MED ORDER — ACETAMINOPHEN 650 MG RE SUPP: 650.0000 mg | Freq: Four times a day (QID) | RECTAL | Status: DC | PRN

## 2017-04-18 MED ORDER — BRINZOLAMIDE 1 % OP SUSP
1.0000 [drp] | Freq: Two times a day (BID) | OPHTHALMIC | Status: DC
  Administered 2017-04-19 – 2017-04-22 (×8): 1 [drp] via OPHTHALMIC
  Filled 2017-04-18: qty 10

## 2017-04-18 MED ORDER — INSULIN ASPART 100 UNIT/ML ~~LOC~~ SOLN
0.0000 [IU] | Freq: Three times a day (TID) | SUBCUTANEOUS | Status: DC
  Administered 2017-04-19: 2 [IU] via SUBCUTANEOUS

## 2017-04-18 MED ORDER — ZINC OXIDE 11.3 % EX CREA
1.0000 "application " | TOPICAL_CREAM | Freq: Two times a day (BID) | CUTANEOUS | Status: DC
  Administered 2017-04-19 – 2017-04-22 (×7): 1 via TOPICAL
  Filled 2017-04-18: qty 56

## 2017-04-18 MED ORDER — ONDANSETRON HCL 4 MG/2ML IJ SOLN
4.0000 mg | Freq: Four times a day (QID) | INTRAMUSCULAR | Status: DC | PRN
Start: 2017-04-18 — End: 2017-04-22

## 2017-04-18 MED ORDER — ACETAMINOPHEN 325 MG PO TABS: 650.0000 mg | ORAL_TABLET | Freq: Four times a day (QID) | ORAL | Status: DC | PRN

## 2017-04-18 MED ORDER — BACLOFEN 20 MG PO TABS
20.0000 mg | ORAL_TABLET | Freq: Two times a day (BID) | ORAL | Status: DC
  Administered 2017-04-19 – 2017-04-22 (×7): 20 mg via ORAL
  Filled 2017-04-18 (×8): qty 1

## 2017-04-18 MED ORDER — SODIUM CHLORIDE 0.9 % IV SOLN
INTRAVENOUS | Status: DC
  Administered 2017-04-19: 02:00:00 via INTRAVENOUS

## 2017-04-18 MED ORDER — ONDANSETRON HCL 4 MG PO TABS: 4.0000 mg | ORAL_TABLET | Freq: Four times a day (QID) | ORAL | Status: DC | PRN

## 2017-04-18 MED ORDER — LATANOPROST 0.005 % OP SOLN
1.0000 [drp] | Freq: Every day | OPHTHALMIC | Status: DC
  Administered 2017-04-19 – 2017-04-21 (×4): 1 [drp] via OPHTHALMIC
  Filled 2017-04-18: qty 2.5

## 2017-04-18 NOTE — ED Provider Notes (Signed)
[facility] DEPT Provider Note   CSN: [id number] Arrival date & time: 04/18/17  [name]     History   Chief Complaint Chief Complaint  Patient presents with  . GI Bleeding    HPI [name] is a 61 y.o. male.  HPI   61 year old male with bleeding from his ostomy. He has a past history of multiple myeloma complicated by paraplegia. He also has a history of perforated sigmoid diverticulitis with exploratory laparoscopy and sigmoid colostomy in 2013. He has had intermittent rectal bleeding and had a colonoscopy on 9/20. The diverted colon looked ok. A 15 mm polyp was removed from colon accessed via the ostomy.   He reports that he has been doing well since the procedure until today. Initially noticed some pinkish discharge from his ostomy. Shortly before arrival he noticed a large quantity of dark blood. He states it was enough to fill the entire colostomy bag. He is on xarelto. He denies any abdominal discomfort. No dizziness or lightheadedness. No shortness of breath.  Past Medical History:  Diagnosis Date  . Adrenal insufficiency (HCC)    on chronic dexamethasone  . Anemia   . Cancer ([place])   . Coagulopathy ([place])    on xeralto/ s/p DVT while on coumadin,  IVC in place  . Diabetes mellitus without complication ([place])    type 2  . Gross hematuria 7/14   post foley cath procedure  . History of blood transfusion 7/14  . Multiple myeloma    thoracic T8 with paraplegia s/p resection- on chemo at visit 10/13/10  . Multiple myeloma   . Multiple myeloma without mention of remission   . Neurogenic bladder   . Neurogenic bowel   . Paraplegia ([place])   . Partial small bowel obstruction ([place]) during dec 2011 admission    Patient Active Problem List   Diagnosis Date Noted  . Colon cancer screening   . Rectal bleeding   . Benign neoplasm of ascending colon   . History of DVT (deep vein thrombosis) 06/10/2015  . Obstructed suprapubic catheter ([place]) 07/21/2014  . Bilateral  hydronephrosis 07/21/2014  . Acute kidney failure ([place]) 07/20/2014  . Abdominal pain 07/20/2014  . AKI (acute kidney injury) ([employer]) 07/20/2014  . Hematuria 02/12/2013  . Shock ([place]) 02/12/2013  . Adrenal insufficiency ([place]) 02/12/2013  . Hypoalbuminemia 11/15/2012  . Anemia, unspecified 09/18/2012  . Unspecified vitamin D deficiency 09/18/2012  . 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 Iron deficiency anemia 09/18/2012  . Diverticulitis of large intestine with perforation 07/26/2011  . Multiple myeloma in remission ([place]) 10/14/2010  . Neurogenic bowel 10/14/2010  . Paraplegia ([place]) 07/13/2010  . Neurogenic bladder 07/11/2010  . BACK PAIN, LUMBAR, WITH RADICULOPATHY 07/07/2010  . DISEQUILIBRIUM 07/07/2010  . Abdominal pain, generalized 07/07/2010  . OBESITY, NOS 09/20/2006    Past Surgical History:  Procedure Laterality Date  . COLONOSCOPY WITH PROPOFOL N/A 04/12/2017   Procedure: COLONOSCOPY WITH PROPOFOL;  Surgeon: Irene Shipper, MD;  Location: [facility];  Service: Endoscopy;  Laterality: N/A;  . COLOSTOMY  07/20/2011   Procedure: COLOSTOMY;  Surgeon: Judieth Keens, DO;  Location: [facility] OR;  Service: General;;  . COLOSTOMY REVISION  07/20/2011   Procedure: COLON RESECTION SIGMOID;  Surgeon: Judieth Keens, DO;  Location: [facility];  Service: General;;  . CYSTOSCOPY N/A 04/04/2013   Procedure: CYSTOSCOPY WITH LITHALOPAXY;  Surgeon: Alexis Frock, MD;  Location: [facility]S;  Service: Urology;  Laterality: N/A;  .  INSERTION OF SUPRAPUBIC CATHETER N/A 04/04/2013   Procedure: INSERTION OF SUPRAPUBIC CATHETER;  Surgeon: Alexis Frock, MD;  Location: [facility];  Service: Urology;  Laterality: N/A;  . LAPAROTOMY  07/20/2011   Procedure: EXPLORATORY LAPAROTOMY;  Surgeon: Judieth Keens, DO;  Location: [place];  Service: General;  Laterality: N/A;  . myeloma thoracic T8 with parpaplegia s/p thoracotomy and thoracic T7-9 cage placement on Dec 26th 2011   07/18/10       Home Medications    Prior to Admission medications   Medication Sig Start Date End Date Taking? Authorizing Provider  baclofen (LIORESAL) 20 MG tablet Take 20 mg by mouth 2 (two) times daily.    [provider]  brinzolamide (AZOPT) 1 % ophthalmic suspension Place 1 drop into both eyes 2 (two) times daily.    [provider]  latanoprost (XALATAN) 0.005 % ophthalmic solution Place 1 drop into both eyes at bedtime.    [provider]  metFORMIN (GLUCOPHAGE) 500 MG tablet Take 500 mg by mouth 2 (two) times daily with a meal.    [provider]  Rivaroxaban (XARELTO) 20 MG TABS tablet Take 20 mg by mouth daily with supper.    [provider]  Skin Protectants, Misc. (EUCERIN) cream Apply 1 application topically 2 (two) times daily as needed for dry skin.     [provider]  zinc oxide (BALMEX) 11.3 % CREA cream Apply 1 application topically 2 (two) times daily.    [provider]    Family History Family History  Problem Relation Age of Onset  . Cancer Mother   . Ovarian cancer Mother   . Diabetes Father     Social History Social History  Substance Use Topics  . Smoking status: Never Smoker  . Smokeless tobacco: Never Used  . Alcohol use No     Allergies   Feraheme [ferumoxytol]   Review of Systems Review of Systems  All systems reviewed and negative, other than as noted in HPI.  Physical Exam Updated Vital Signs BP 115/80   Pulse (!) 109   Temp 98 F (36.7 C) (Oral)   Resp 16   SpO2 98%   Physical Exam  Constitutional: He appears well-developed and well-nourished. No distress.  HENT:  Head: Normocephalic and atraumatic.  Eyes: Conjunctivae are normal. Right eye exhibits no discharge. Left eye exhibits no discharge.  Neck: Neck supple.  Cardiovascular: Normal rate, regular rhythm and normal heart sounds.  Exam reveals no gallop and no friction rub.   No murmur  heard. Pulmonary/Chest: Effort normal and breath sounds normal. No respiratory distress.  Abdominal: Soft. He exhibits no distension. There is no tenderness.  Ostomy with dark red blood in bag. No stool noted.   Genitourinary:  Genitourinary Comments: Suprapubic catheter  Musculoskeletal: He exhibits no edema or tenderness.  Neurological: He is alert.  Skin: Skin is warm and dry.  Psychiatric: He has a normal mood and affect. His behavior is normal. Thought content normal.  Nursing note and vitals reviewed.    ED Treatments / Results  Labs (all labs ordered are listed, but only abnormal results are displayed) Labs Reviewed  COMPREHENSIVE METABOLIC PANEL - Abnormal; Notable for the following:       Result Value   Glucose, Bld 113 (*)    All other components within normal limits  CBC - Abnormal; Notable for the following:    WBC 12.2 (*)    Hemoglobin 12.8 (*)    HCT 36.3 (*)  MCV 71.9 (*)    MCH 25.3 (*)    RDW 16.6 (*)    All other components within normal limits  POC OCCULT BLOOD, ED  TYPE AND SCREEN    EKG  EKG Interpretation None       Radiology No results found.  Procedures Procedures (including critical care time)  Medications Ordered in ED Medications - No data to display   Initial Impression / Assessment and Plan / ED Course  I have reviewed the triage vital signs and the nursing notes.  Pertinent labs & imaging results that were available during my care of the patient were reviewed by me and considered in my medical decision making (see chart for details).     60yM with what I suspect is a post-polypectomy bleed. 38m polyp removed by hot snare on 9/20. Describes a decent amount of bleeding. He is anti coagualted on xarelto. No clear symptoms on blood loss anemia but borderline HR at rest. Initial H/H ok.   Discussed with Dr JArdis Hughs [facility]. Clear liquids. Will see in consultation tomorrow. Admit to medicine.   Final Clinical Impressions(s) /  ED Diagnoses   Final diagnoses:  Acute GI bleeding  Status post colonoscopy with polypectomy  On rivaroxaban therapy    New Prescriptions New Prescriptions   No medications on file     KVirgel Manifold MD 04/18/17 2042

## 2017-04-18 NOTE — ED Notes (Signed)
Patient denies pain and is resting comfortably.  

## 2017-04-18 NOTE — ED Triage Notes (Signed)
Per [facility] patient bleeding at ostomy that started off light pink and now darker possibly lost 1 liter of blood per FIRE to EMS.  Patient baseline is no felling from waist down.  CBG 141

## 2017-04-18 NOTE — H&P (Signed)
History and Physical    AMUN STEMM OEV:[id number] DOB: 04-07-1956 DOA: 04/18/2017  PCP: [name], MD  Patient coming from: Home.  Chief Complaint: Bleeding in the colostomy bag.  HPI: [name] is a 61 y.o. male with history of multiple myeloma in remission complicated with paraplegia, history of perforated viscus status post colostomy placement, diabetes mellitus and as per the records adrenal insufficiency had a colonoscopy done a week ago on 04/12/2017 during which one polyp was removed from the ascending colon. 3 days ago patient started noticing some blood-tinged bowel movement. Last evening he became frankly bloody and started filling up the colostomy bag. Denies any abdominal pain nausea vomiting fever or chills.  ED Course: In the ER patient remained hemodynamically stable. Hemoglobin last was around 13.6 and today is around 12.8. On-call gastroenterologist Dr. Ardis Hughs was consulted and patient is being admitted for acute GI bleed likely lower GI bleed in the setting of xarelto.  Review of Systems: As per HPI, rest all negative.   Past Medical History:  Diagnosis Date  . Adrenal insufficiency (HCC)    on chronic dexamethasone  . Anemia   . Cancer ([place])   . Coagulopathy ([place])    on xeralto/ s/p DVT while on coumadin,  IVC in place  . Diabetes mellitus without complication ([name])    type 2  . Gross hematuria 7/14   post foley cath procedure  . History of blood transfusion 7/14  . Multiple myeloma    thoracic T8 with paraplegia s/p resection- on chemo at visit 10/13/10  . Multiple myeloma   . Multiple myeloma without mention of remission   . Neurogenic bladder   . Neurogenic bowel   . Paraplegia ([name])   . Partial small bowel obstruction ([name]) during dec 2011 admission    Past Surgical History:  Procedure Laterality Date  . COLONOSCOPY WITH PROPOFOL N/A 04/12/2017   Procedure: COLONOSCOPY WITH PROPOFOL;  Surgeon: Irene Shipper, MD;  [facility];  Service: Endoscopy;  Laterality: N/A;  . COLOSTOMY  07/20/2011   Procedure: COLOSTOMY;  Surgeon: Judieth Keens, DO;  Location: [facility] OR;  Service: General;;  . COLOSTOMY REVISION  07/20/2011   Procedure: COLON RESECTION SIGMOID;  Surgeon: Judieth Keens, DO;  Location: [facility];  Service: General;;  . CYSTOSCOPY N/A 04/04/2013   Procedure: CYSTOSCOPY WITH LITHALOPAXY;  Surgeon: Alexis Frock, MD;  [facility];  Service: Urology;  Laterality: N/A;  . INSERTION OF SUPRAPUBIC CATHETER N/A 04/04/2013   Procedure: INSERTION OF SUPRAPUBIC CATHETER;  Surgeon: Alexis Frock, MD;  [facility];  Service: Urology;  Laterality: N/A;  . LAPAROTOMY  07/20/2011   Procedure: EXPLORATORY LAPAROTOMY;  Surgeon: Judieth Keens, DO;  Location: [place];  Service: General;  Laterality: N/A;  . myeloma thoracic T8 with parpaplegia s/p thoracotomy and thoracic T7-9 cage placement on Dec 26th 2011  07/18/10     reports that he has never smoked. He has never used smokeless tobacco. He reports that he does not drink alcohol or use drugs.  Allergies  Allergen Reactions  . Feraheme [Ferumoxytol] Nausea And Vomiting    Felt like he was going to pass out.    Family History  Problem Relation Age of Onset  . Cancer Mother   . Ovarian cancer Mother   . Diabetes Father     Prior to Admission medications   Medication Sig Start Date End Date Taking? Authorizing Provider  baclofen (LIORESAL) 20 MG tablet  Take 20 mg by mouth 2 (two) times daily.   Yes [provider]  brinzolamide (AZOPT) 1 % ophthalmic suspension Place 1 drop into both eyes 2 (two) times daily.   Yes [provider]  latanoprost (XALATAN) 0.005 % ophthalmic solution Place 1 drop into both eyes at bedtime.   Yes [provider]  metFORMIN (GLUCOPHAGE) 500 MG tablet Take 500 mg by mouth 2 (two) times daily with a meal.   Yes [provider]  Rivaroxaban (XARELTO) 20 MG TABS tablet Take 20 mg  by mouth daily with supper.   Yes [provider]  Skin Protectants, Misc. (EUCERIN) cream Apply 1 application topically 2 (two) times daily as needed for dry skin.    Yes [provider]  zinc oxide (BALMEX) 11.3 % CREA cream Apply 1 application topically 2 (two) times daily.   Yes [provider]    Physical Exam: Vitals:   04/18/17 2000 04/18/17 2047 04/18/17 2130 04/18/17 2203  BP: 109/79 98/81 113/84 121/85  Pulse: 92 97 95 (!) 102  Resp: 18 18 20 19   Temp:      TempSrc:      SpO2: 97% 96% 95% 96%      Constitutional: Moderately built and nourished. Vitals:   04/18/17 2000 04/18/17 2047 04/18/17 2130 04/18/17 2203  BP: 109/79 98/81 113/84 121/85  Pulse: 92 97 95 (!) 102  Resp: 18 18 20 19   Temp:      TempSrc:      SpO2: 97% 96% 95% 96%   Eyes: Anicteric no pallor. ENMT: No discharge from the ears eyes nose or mouth. Neck: No mass felt. No JVD appreciated. Respiratory: No rhonchi or crepitations. Cardiovascular: S1-S2 heard no murmurs appreciated. Abdomen: Colostomy bag in the left lower quadrant with some dark feces. Abdomen is nontender. Musculoskeletal: No edema. Skin: No rash. Neurologic: Alert awake oriented to time place and person. Paraplegic. Psychiatric: Appears normal.   Labs on Admission: I have personally reviewed following labs and imaging studies  CBC:  Recent Labs Lab 04/18/17 1916  WBC 12.2*  HGB 12.8*  HCT 36.3*  MCV 71.9*  PLT 893   Basic Metabolic Panel:  Recent Labs Lab 04/18/17 1916  NA 138  K 3.8  CL 105  CO2 23  GLUCOSE 113*  BUN 15  CREATININE 0.82  CALCIUM 9.4   GFR: Estimated Creatinine Clearance: 108.2 mL/min (by C-G formula based on SCr of 0.82 mg/dL). Liver Function Tests:  Recent Labs Lab 04/18/17 1916  AST 23  ALT 23  ALKPHOS 71  BILITOT 0.3  PROT 7.7  ALBUMIN 3.8   No results for input(s): LIPASE, AMYLASE in the last 168 hours. No results for input(s): AMMONIA in the last  168 hours. Coagulation Profile: No results for input(s): INR, PROTIME in the last 168 hours. Cardiac Enzymes: No results for input(s): CKTOTAL, CKMB, CKMBINDEX, TROPONINI in the last 168 hours. BNP (last 3 results) No results for input(s): PROBNP in the last 8760 hours. HbA1C: No results for input(s): HGBA1C in the last 72 hours. CBG:  Recent Labs Lab 04/12/17 0837  GLUCAP 109*   Lipid Profile: No results for input(s): CHOL, HDL, LDLCALC, TRIG, CHOLHDL, LDLDIRECT in the last 72 hours. Thyroid Function Tests: No results for input(s): TSH, T4TOTAL, FREET4, T3FREE, THYROIDAB in the last 72 hours. Anemia Panel: No results for input(s): VITAMINB12, FOLATE, FERRITIN, TIBC, IRON, RETICCTPCT in the last 72 hours. Urine analysis:    Component Value Date/Time   COLORURINE YELLOW  07/21/2014 1123   APPEARANCEUR TURBID (A) 07/21/2014 1123   LABSPEC 1.013 07/21/2014 1123   PHURINE 8.0 07/21/2014 1123   GLUCOSEU NEGATIVE 07/21/2014 1123   HGBUR LARGE (A) 07/21/2014 1123   HGBUR small 07/08/2010 0831   BILIRUBINUR NEGATIVE 07/21/2014 1123   KETONESUR NEGATIVE 07/21/2014 1123   PROTEINUR 100 (A) 07/21/2014 1123   UROBILINOGEN 0.2 07/21/2014 1123   NITRITE NEGATIVE 07/21/2014 1123   LEUKOCYTESUR LARGE (A) 07/21/2014 1123   Sepsis Labs: @LABRCNTIP (procalcitonin:4,lacticidven:4) )No results found for this or any previous visit (from the past 240 hour(s)).   Radiological Exams on Admission: No results found.   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 Lower GI bleed - recent polypectomy and also patient is on xarelto. Which will be held at this time. We'll keep patient on clear liquids as  recommended by gastroenterologist and follow serial CBCs. Transfuse if hemoglobin is less than 7 or patient becomes hypotensive. Gastroenterologist was consulted by the ER physician. 2. Acute blood loss anemia - follow CBC. 3. Multiple myeloma with paraplegia in remission. 4. Diabetes mellitus type 2 - will keep patient on sliding scale coverage. 5. History of DVT on xarelto which is being held due to GI bleed.  I have reviewed patient's old charts and labs.   DVT prophylaxis: SCDs. Code Status: Full code.  Family Communication: Discussed with patient.  Disposition Plan: Home.  Consults called: Copywriter, advertising.  Admission status: Inpatient.    Rise Patience MD [facility] Pager [phone].  If 7PM-7AM, please contact night-coverage [email or web address] Password [email or web address]  04/18/2017, 10:17 PM

## 2017-04-19 ENCOUNTER — Inpatient Hospital Stay (HOSPITAL_COMMUNITY): Payer: Medicare (Managed Care) | Admitting: Anesthesiology

## 2017-04-19 ENCOUNTER — Encounter (HOSPITAL_COMMUNITY)
Admission: EM | Disposition: A | Discharge status: Home / Self Care | Payer: Self-pay | Source: Home / Self Care | Attending: Internal Medicine

## 2017-04-19 ENCOUNTER — Encounter (HOSPITAL_COMMUNITY): Payer: Self-pay | Admitting: *Deleted

## 2017-04-19 DIAGNOSIS — Z7901 Long term (current) use of anticoagulants: Secondary | ICD-10-CM

## 2017-04-19 DIAGNOSIS — K625 Hemorrhage of anus and rectum: Secondary | ICD-10-CM

## 2017-04-19 DIAGNOSIS — K633 Ulcer of intestine: Secondary | ICD-10-CM

## 2017-04-19 DIAGNOSIS — Z9889 Other specified postprocedural states: Secondary | ICD-10-CM

## 2017-04-19 DIAGNOSIS — K922 Gastrointestinal hemorrhage, unspecified: Secondary | ICD-10-CM

## 2017-04-19 HISTORY — PX: COLONOSCOPY WITH PROPOFOL: SHX5780

## 2017-04-19 LAB — BASIC METABOLIC PANEL
Anion gap: 6 (ref 5–15)
BUN: 16 mg/dL (ref 6–20)
CO2: 26 mmol/L (ref 22–32)
Calcium: 8.5 mg/dL — ABNORMAL LOW (ref 8.9–10.3)
Chloride: 108 mmol/L (ref 101–111)
Creatinine, Ser: 0.84 mg/dL (ref 0.61–1.24)
GFR calc Af Amer: >60 mL/min (ref >60)
GFR calc non Af Amer: >60 mL/min (ref >60)
Glucose, Bld: 155 mg/dL — ABNORMAL HIGH (ref 65–99)
Potassium: 4.4 mmol/L (ref 3.5–5.1)
Sodium: 140 mmol/L (ref 135–145)

## 2017-04-19 LAB — CBC
HCT: 29 % — ABNORMAL LOW (ref 39.0–52.0)
Hemoglobin: 10 g/dL — ABNORMAL LOW (ref 13.0–17.0)
MCH: 25.1 pg — ABNORMAL LOW (ref 26.0–34.0)
MCHC: 34.5 g/dL (ref 30.0–36.0)
MCV: 72.9 fL — ABNORMAL LOW (ref 78.0–100.0)
Platelets: 285 10*3/uL (ref 150–400)
RBC: 3.98 MIL/uL — ABNORMAL LOW (ref 4.22–5.81)
RDW: 16.6 % — ABNORMAL HIGH (ref 11.5–15.5)
WBC: 10.8 10*3/uL — ABNORMAL HIGH (ref 4.0–10.5)

## 2017-04-19 LAB — HEMOGLOBIN AND HEMATOCRIT, BLOOD
HCT: 26.3 % — ABNORMAL LOW (ref 39.0–52.0)
HCT: 21.7 % — ABNORMAL LOW (ref 39.0–52.0)
Hemoglobin: 9.3 g/dL — ABNORMAL LOW (ref 13.0–17.0)
Hemoglobin: 7.6 g/dL — ABNORMAL LOW (ref 13.0–17.0)

## 2017-04-19 LAB — GLUCOSE, CAPILLARY
Glucose-Capillary: 116 mg/dL — ABNORMAL HIGH (ref 65–99)
Glucose-Capillary: 94 mg/dL (ref 65–99)
Glucose-Capillary: 163 mg/dL — ABNORMAL HIGH (ref 65–99)
Glucose-Capillary: 137 mg/dL — ABNORMAL HIGH (ref 65–99)
Glucose-Capillary: 140 mg/dL — ABNORMAL HIGH (ref 65–99)

## 2017-04-19 LAB — HIV ANTIBODY (ROUTINE TESTING W REFLEX): HIV Screen 4th Generation wRfx: NONREACTIVE

## 2017-04-19 LAB — PREPARE RBC (CROSSMATCH)

## 2017-04-19 SURGERY — COLONOSCOPY WITH PROPOFOL
Anesthesia: Monitor Anesthesia Care

## 2017-04-19 MED ORDER — ONDANSETRON HCL 4 MG/2ML IJ SOLN
INTRAMUSCULAR | Status: DC | PRN
  Administered 2017-04-19: 4 mg via INTRAVENOUS

## 2017-04-19 MED ORDER — SODIUM CHLORIDE 0.9 % IV SOLN
Freq: Once | INTRAVENOUS | Status: AC
  Administered 2017-04-19: 18:00:00 via INTRAVENOUS

## 2017-04-19 MED ORDER — PEG-KCL-NACL-NASULF-NA ASC-C 100 G PO SOLR
1.0000 | Freq: Once | ORAL | Status: AC
  Administered 2017-04-19: 200 g via ORAL
  Filled 2017-04-19: qty 1

## 2017-04-19 MED ORDER — LACTATED RINGERS IV SOLN
INTRAVENOUS | Status: DC
Start: 2017-04-19 — End: 2017-04-22
  Administered 2017-04-19: 1000 mL via INTRAVENOUS
  Administered 2017-04-19: 14:00:00 via INTRAVENOUS

## 2017-04-19 MED ORDER — DEXAMETHASONE SODIUM PHOSPHATE 10 MG/ML IJ SOLN
INTRAMUSCULAR | Status: DC | PRN
  Administered 2017-04-19: 10 mg via INTRAVENOUS

## 2017-04-19 MED ORDER — PROPOFOL 10 MG/ML IV BOLUS
INTRAVENOUS | Status: DC | PRN
  Administered 2017-04-19: 20 mg via INTRAVENOUS
  Administered 2017-04-19: 50 mg via INTRAVENOUS

## 2017-04-19 MED ORDER — SODIUM CHLORIDE 0.9 % IV SOLN: Freq: Once | INTRAVENOUS | Status: AC

## 2017-04-19 MED ORDER — LIDOCAINE 2% (20 MG/ML) 5 ML SYRINGE
INTRAMUSCULAR | Status: DC | PRN
  Administered 2017-04-19: 100 mg via INTRAVENOUS

## 2017-04-19 MED ORDER — ALBUMIN HUMAN 5 % IV SOLN
INTRAVENOUS | Status: DC | PRN
  Administered 2017-04-19: 15:00:00 via INTRAVENOUS

## 2017-04-19 MED ORDER — SODIUM CHLORIDE 0.9 % IJ SOLN
PREFILLED_SYRINGE | INTRAMUSCULAR | Status: DC | PRN
  Administered 2017-04-19: 9 mL

## 2017-04-19 MED ORDER — LIDOCAINE 2% (20 MG/ML) 5 ML SYRINGE
INTRAMUSCULAR | Status: AC
  Filled 2017-04-19: qty 5

## 2017-04-19 MED ORDER — INFLUENZA VAC SPLIT QUAD 0.5 ML IM SUSY
0.5000 mL | PREFILLED_SYRINGE | INTRAMUSCULAR | Status: AC
  Administered 2017-04-20: 0.5 mL via INTRAMUSCULAR
  Filled 2017-04-19: qty 0.5

## 2017-04-19 MED ORDER — PROPOFOL 500 MG/50ML IV EMUL
INTRAVENOUS | Status: DC | PRN
  Administered 2017-04-19: 75 ug/kg/min via INTRAVENOUS

## 2017-04-19 MED ORDER — ALBUMIN HUMAN 5 % IV SOLN
INTRAVENOUS | Status: AC
Start: 2017-04-19 — End: 2017-04-19
  Filled 2017-04-19: qty 250

## 2017-04-19 MED ORDER — PHENYLEPHRINE 40 MCG/ML (10ML) SYRINGE FOR IV PUSH (FOR BLOOD PRESSURE SUPPORT)
PREFILLED_SYRINGE | INTRAVENOUS | Status: DC | PRN
  Administered 2017-04-19 (×2): 40 ug via INTRAVENOUS
  Administered 2017-04-19: 80 ug via INTRAVENOUS
  Administered 2017-04-19: 40 ug via INTRAVENOUS

## 2017-04-19 MED ORDER — EPINEPHRINE PF 1 MG/10ML IJ SOSY
PREFILLED_SYRINGE | INTRAMUSCULAR | Status: AC
  Filled 2017-04-19: qty 10

## 2017-04-19 MED ORDER — ACETAMINOPHEN 325 MG PO TABS
650.0000 mg | ORAL_TABLET | Freq: Once | ORAL | Status: AC
  Administered 2017-04-19: 650 mg via ORAL
  Filled 2017-04-19 (×2): qty 2

## 2017-04-19 MED ORDER — PROPOFOL 10 MG/ML IV BOLUS
INTRAVENOUS | Status: AC
  Filled 2017-04-19: qty 40

## 2017-04-19 MED ORDER — PHENYLEPHRINE 40 MCG/ML (10ML) SYRINGE FOR IV PUSH (FOR BLOOD PRESSURE SUPPORT)
PREFILLED_SYRINGE | INTRAVENOUS | Status: AC
  Filled 2017-04-19: qty 10

## 2017-04-19 MED ORDER — SODIUM CHLORIDE 0.9 % IV SOLN
INTRAVENOUS | Status: DC
  Administered 2017-04-19 – 2017-04-22 (×5): via INTRAVENOUS

## 2017-04-19 SURGICAL SUPPLY — 22 items

## 2017-04-19 NOTE — Progress Notes (Signed)
PROGRESS NOTE    [name]  EXB:[id number] DOB: 1955-10-03 DOA: 04/18/2017 PCP: [name], MD    Brief Narrative: [name] is a 61 y.o. male with history of multiple myeloma in remission complicated with paraplegia, history of perforated viscus status post colostomy placement, diabetes mellitus and as per the records adrenal insufficiency had a colonoscopy done a week ago on 04/12/2017 during which one polyp was removed from the ascending colon. 3 days ago patient started noticing some blood-tinged bowel movement. Last evening he became frankly bloody and started filling up the colostomy bag. Denies any abdominal pain nausea vomiting fever or chills.  ED Course: In the ER patient remained hemodynamically stable. Hemoglobin last was around 13.6 and today is around 12.8. On-call gastroenterologist Dr. Ardis Hughs was consulted and patient is being admitted for acute GI bleed likely lower GI bleed in the setting of xarelto.    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  Will transfuse 2 units PRBC.  IV fluids.  Hold xarelto.  Underwent clip and epi injection of site of bleeding.   2- Acute blood loss anemia; related to number one.  2 Units PRBC>   3-MM, with paraplegia, remission.   4-DM; SSI.   History of DVT 2015; holding xarelto due to GI bleed.  Has IVC filter.       DVT prophylaxis: scd.  Code Status: full code.  Family Communication: care discussed with patient.  Disposition Plan: home when stable.    Consultants:   gi   Procedures: colonoscopy    Antimicrobials:  none   Subjective: He is feeling well, he denies further blood in colostomy  Objective: Vitals:   04/18/17 2330 04/19/17 0000 04/19/17 0050 04/19/17 0548  BP: 100/71 101/74 94/62 105/71  Pulse: 87 89 99 89  Resp:   19 18  Temp:   99.1 F (37.3 C) 98 F (36.7 C)  TempSrc:   Oral Oral  SpO2: 99% 98% 100% 99%    Intake/Output Summary (Last 24 hours) at 04/19/17 0801 Last data filed at 04/19/17 0730  Gross per 24 hour  Intake                0 ml  Output              475 ml  Net             -475 ml   There were no vitals filed for this visit.  Examination:  General exam: Appears calm and comfortable  Respiratory system: Clear to auscultation. Respiratory effort normal. Cardiovascular system: S1 & S2 heard, RRR. No JVD, murmurs, rubs, gallops or clicks. No pedal edema. Gastrointestinal system: Abdomen is nondistended, soft and nontender. No organomegaly or masses felt. Normal bowel sounds heard. Colostomy ion place, clean bag. Supra-pubic catheter  Central nervous system: Alert and oriented.  Skin: No rashes, lesions or ulcers Psychiatry: Judgement and insight appear normal. Mood & affect appropriate.     Data Reviewed: I have personally reviewed following labs and imaging studies  CBC:  Recent Labs Lab 04/18/17 1916 04/18/17  2231 04/19/17 0241  WBC 12.2* 12.8* 10.8*  HGB 12.8* 12.3* 10.0*  HCT 36.3* 34.8* 29.0*  MCV 71.9* 72.2* 72.9*  PLT 374 355 923   Basic Metabolic Panel:  Recent Labs Lab 04/18/17 1916 04/19/17 0241  NA 138 140  K 3.8 4.4  CL 105 108  CO2 23 26  GLUCOSE 113* 155*  BUN 15 16  CREATININE 0.82 0.84  CALCIUM 9.4 8.5*   GFR: Estimated Creatinine Clearance: 105.6 mL/min (by C-G formula based on SCr of 0.84 mg/dL). Liver Function Tests:  Recent Labs Lab 04/18/17 1916  AST 23  ALT 23  ALKPHOS 71  BILITOT 0.3  PROT 7.7  ALBUMIN 3.8   No results for input(s): LIPASE, AMYLASE in the last 168 hours. No results for input(s):  AMMONIA in the last 168 hours. Coagulation Profile: No results for input(s): INR, PROTIME in the last 168 hours. Cardiac Enzymes: No results for input(s): CKTOTAL, CKMB, CKMBINDEX, TROPONINI in the last 168 hours. BNP (last 3 results) No results for input(s): PROBNP in the last 8760 hours. HbA1C: No results for input(s): HGBA1C in the last 72 hours. CBG:  Recent Labs Lab 04/12/17 0837  GLUCAP 109*   Lipid Profile: No results for input(s): CHOL, HDL, LDLCALC, TRIG, CHOLHDL, LDLDIRECT in the last 72 hours. Thyroid Function Tests: No results for input(s): TSH, T4TOTAL, FREET4, T3FREE, THYROIDAB in the last 72 hours. Anemia Panel: No results for input(s): VITAMINB12, FOLATE, FERRITIN, TIBC, IRON, RETICCTPCT in the last 72 hours. Sepsis Labs: No results for input(s): PROCALCITON, LATICACIDVEN in the last 168 hours.  No results found for this or any previous visit (from the past 240 hour(s)).       Radiology Studies: No results found.      Scheduled Meds: . baclofen  20 mg Oral BID  . brinzolamide  1 drop Both Eyes BID  . [START ON 04/20/2017] Influenza vac split quadrivalent PF  0.5 mL Intramuscular Tomorrow-1000  . insulin aspart  0-9 Units Subcutaneous TID WC  . latanoprost  1 drop Both Eyes QHS  . zinc oxide  1 application Topical BID   Continuous Infusions: . sodium chloride       LOS: 1 day    Time spent: 35 minutes,     Regalado, Cassie Freer, MD [facility] Pager [phone]  If 7PM-7AM, please contact night-coverage [email or web address] Password [email or web address] 04/19/2017, 8:01 AM

## 2017-04-19 NOTE — Op Note (Addendum)
[facility] Patient Name: [name] Procedure Date: 04/19/2017 MRN: [id number] Attending MD: Carlota Raspberry. Armbruster MD, MD Date of Birth: 26-Apr-1956 CSN: [id number] Age: 61 Admit Type: Inpatient Procedure:                Colonoscopy Indications:              Hematochezia, history of 58m ascending colon                            adenoma removed via hot snare Providers:                SCarlota Raspberry Armbruster MD, MD, PCleda Daub RN,                            HCletis [place] Technician, [facility] CRNA Referring MD:              Medicines:                Monitored Anesthesia Care Complications:            No immediate complications. Estimated blood loss:                            Minimal. Estimated Blood Loss:     Estimated blood loss was minimal. Procedure:                Pre-Anesthesia Assessment:                           - Prior to the procedure, a History and Physical                            was performed, and patient medications and                            allergies were reviewed. The patient's tolerance of                            previous anesthesia was also reviewed. The risks                            and benefits of the procedure and the sedation                            options and risks were discussed with the patient.                            All questions were answered, and informed consent                            was obtained. Prior Anticoagulants: The patient has                            taken Xarelto (rivaroxaban), last dose was 1 day  prior to procedure. ASA Grade Assessment: III - A                            patient with severe systemic disease. After                            reviewing the risks and benefits, the patient was                            deemed in satisfactory condition to undergo the                            procedure.                           After obtaining informed  consent, the colonoscope                            was passed under direct vision. Throughout the                            procedure, the patient's blood pressure, pulse, and                            oxygen saturations were monitored continuously. The                            EC-[id number] ([id number]) scope was introduced through                            the anus and advanced to the the cecum, identified                            by appendiceal orifice and ileocecal valve. The                            colonoscopy was technically difficult and complex                            due to poor endoscopic visualization. The patient                            tolerated the procedure well. The quality of the                            bowel preparation was fair. The ileocecal valve and                            the appendiceal orifice were photographed. Scope In: 2:05:00 PM Scope Out: 2:41:00 PM Scope Withdrawal Time: 0 hours 16 minutes 0 seconds  Total Procedure Duration: 0 hours 36 minutes 0 seconds  Findings:      A single ten mm ulcer was found in the ascending colon at the       polypectomy site.  A large blood clot was initially attached to it, part       of which was initially removed with suction. A smaller clot persisted at       the base of the polypectomy site and had active bleeding noted from it.       A cold snare was used to remove the clot and reveal an ulcer base at the       polypectomy site with active bleeding. For hemostasis, hemostasis clips       were placed and the area was successfully injected with 9 mL of a       1:10,000 solution of epinephrine. In total, 6 clips were used to       eventually obtain hemostasis and close the defect.      Red blood was found in the entire colon otherwise. Impression:               - Preparation of the colon was fair.                           - Bleeding polypectomy site in the ascending colon.                            It was  technically challenging to obtain hemostasis                            at the site - 6 clips were placed and injected with                            epinephrine for hemostasis                           - Blood in the entire examined colon, patient has                            had significant bleeding from this lesion. Moderate Sedation:      No moderate sedation, case performed with MAC Recommendation:           - Return patient to hospital ward for ongoing care.                           - Clear liquid diet for now                           - Continue present medications.                           - HOLD Xarelto, patient is at risk for rebleeding                           - recheck Hgb now, transfuse RBC if needed                           - patent will continue to pass some blood given the  amount noted in the colon. need to monitor                            hemodynamics closely and trend Hgb                           - we will continue to follow closely, please page                            with questions Procedure Code(s):        --- Professional ---                           [phone], Colonoscopy, flexible; with control of                            bleeding, any method Diagnosis Code(s):        --- Professional ---                           K63.3, Ulcer of intestine                           K92.2, Gastrointestinal hemorrhage, unspecified                           K92.1, Melena (includes Hematochezia) CPT copyright 2016 American Medical Association. All rights reserved. The codes documented in this report are preliminary and upon coder review may  be revised to meet current compliance requirements. Remo Lipps P. Armbruster MD, MD 04/19/2017 3:04:48 PM This report has been signed electronically. Number of Addenda: 0

## 2017-04-19 NOTE — Interval H&P Note (Signed)
History and Physical Interval Note:  04/19/2017 1:55 PM  [name]  has presented today for surgery, with the diagnosis of GI bleed  The various methods of treatment have been discussed with the patient and family. After consideration of risks, benefits and other options for treatment, the patient has consented to  Procedure(s): COLONOSCOPY WITH PROPOFOL (N/A) as a surgical intervention .  The patient's history has been reviewed, patient examined, no change in status, stable for surgery.  I have reviewed the patient's chart and labs.  Questions were answered to the patient's satisfaction.     [place]

## 2017-04-19 NOTE — H&P (View-Only) (Signed)
Consultation  Referring Provider: [facility] Tyrell Antonio  Primary Care Physician:  [name], MD Primary Gastroenterologist:  Dr.Perry  Reason for Consultation:  Acute post polypectomy bleed  HPI: [name] is a 61 y.o. male , who was initially seen in consultation in our office on 02/15/2017 with complaints of rectal bleeding. Patient describes some oozing from his rectal stump. Patient has a complicated past medical history with multiple myeloma, complicated by paraplegia in 2013. He is currently in remission. He had complication of neurogenic bladder as well as. He has had bilateral DVTs about 2-3 years ago and has been on Xarelto since. He underwent emergency laparotomy, sigmoid colostomy in 2013 for perforated diverticulitis and has a permanent colostomy. He had not had any prior colonoscopy. Patient underwent colonoscopy with Dr. Henrene Pastor one week ago on 04/12/2017. The rectal stump was evaluated and unremarkable. He eats scope was used through his colostomy with finding of 15 millimeter polyp in the ascending colon which was removed. Path returned showing a tubular adenoma. Patient resumed his Xarelto after the colonoscopy. He says he started noticing some blood in his colostomy about 48 hours ago. This has persisted and has been dark red in color. He has not noted any abdominal pain or cramping ,he has felt a little bit weak. He presented to the emergency room last evening and was admitted. He has been hemodynamically stable. His last dose of Xarelto was yesterday about 4 PM. Reviewing his labs baseline hemoglobin 13.6 in August 2018, on arrival last night hemoglobin 12.8. He is drop to 9.3 this morning.   Past Medical History:  Diagnosis Date  . Adrenal insufficiency (HCC)    on chronic dexamethasone  . Anemia   . Cancer ([place])   . Coagulopathy ([place])    on xeralto/ s/p DVT while on coumadin,  IVC in place  . Diabetes mellitus without complication ([place])    type 2   . Gross hematuria 7/14   post foley cath procedure  . History of blood transfusion 7/14  . Multiple myeloma    thoracic T8 with paraplegia s/p resection- on chemo at visit 10/13/10  . Multiple myeloma   . Multiple myeloma without mention of remission   . Neurogenic bladder   . Neurogenic bowel   . Paraplegia ([place])   . Partial small bowel obstruction ([place]) during dec 2011 admission    Past Surgical History:  Procedure Laterality Date  . COLONOSCOPY WITH PROPOFOL N/A 04/12/2017   Procedure: COLONOSCOPY WITH PROPOFOL;  Surgeon: Irene Shipper, MD;  Location: [facility];  Service: Endoscopy;  Laterality: N/A;  . COLOSTOMY  07/20/2011   Procedure: COLOSTOMY;  Surgeon: Judieth Keens, DO;  Location: [facility] OR;  Service: General;;  . COLOSTOMY REVISION  07/20/2011   Procedure: COLON RESECTION SIGMOID;  Surgeon: Judieth Keens, DO;  Location: [place];  Service: General;;  . CYSTOSCOPY N/A 04/04/2013   Procedure: CYSTOSCOPY WITH LITHALOPAXY;  Surgeon: Alexis Frock, MD;  Location: [facility] ORS;  Service: Urology;  Laterality: N/A;  . INSERTION OF SUPRAPUBIC CATHETER N/A 04/04/2013   Procedure: INSERTION OF SUPRAPUBIC CATHETER;  Surgeon: Alexis Frock, MD;  Location: [facility] ORS;  Service: Urology;  Laterality: N/A;  . LAPAROTOMY  07/20/2011   Procedure: EXPLORATORY LAPAROTOMY;  Surgeon: Judieth Keens, DO;  Location: [place];  Service: General;  Laterality: N/A;  . myeloma thoracic T8 with parpaplegia s/p thoracotomy and thoracic T7-9 cage placement on Dec 26th 2011  07/18/10    Prior to  Admission medications   Medication Sig Start Date End Date Taking? Authorizing Provider  baclofen (LIORESAL) 20 MG tablet Take 20 mg by mouth 2 (two) times daily.   Yes [provider]  brinzolamide (AZOPT) 1 % ophthalmic suspension Place 1 drop into both eyes 2 (two) times daily.   Yes [provider]  latanoprost (XALATAN) 0.005 % ophthalmic solution Place 1 drop into both eyes at bedtime.    Yes [provider]  metFORMIN (GLUCOPHAGE) 500 MG tablet Take 500 mg by mouth 2 (two) times daily with a meal.   Yes [provider]  Rivaroxaban (XARELTO) 20 MG TABS tablet Take 20 mg by mouth daily with supper.   Yes [provider]  Skin Protectants, Misc. (EUCERIN) cream Apply 1 application topically 2 (two) times daily as needed for dry skin.    Yes [provider]  zinc oxide (BALMEX) 11.3 % CREA cream Apply 1 application topically 2 (two) times daily.   Yes [provider]    Current Facility-Administered Medications  Medication Dose Route Frequency Provider Last Rate Last Dose  . 0.9 %  sodium chloride infusion   Intravenous Continuous Regalado, Belkys A, MD 100 mL/hr at 04/19/17 0904    . acetaminophen (TYLENOL) tablet 650 mg  650 mg Oral Q6H PRN Rise Patience, MD       Or  . acetaminophen (TYLENOL) suppository 650 mg  650 mg Rectal Q6H PRN Rise Patience, MD      . baclofen (LIORESAL) tablet 20 mg  20 mg Oral BID Rise Patience, MD   20 mg at 04/19/17 5277  . brinzolamide (AZOPT) 1 % ophthalmic suspension 1 drop  1 drop Both Eyes BID Rise Patience, MD   1 drop at 04/19/17 0904  . [START ON 04/20/2017] Influenza vac split quadrivalent PF (FLUARIX) injection 0.5 mL  0.5 mL Intramuscular Tomorrow-1000 Gean Birchwood N, MD      . insulin aspart (novoLOG) injection 0-9 Units  0-9 Units Subcutaneous TID WC Rise Patience, MD      . latanoprost (XALATAN) 0.005 % ophthalmic solution 1 drop  1 drop Both Eyes QHS Rise Patience, MD   1 drop at 04/19/17 0127  . ondansetron (ZOFRAN) tablet 4 mg  4 mg Oral Q6H PRN Rise Patience, MD       Or  . ondansetron [facility]) injection 4 mg  4 mg Intravenous Q6H PRN Rise Patience, MD      . peg 3350 powder (MOVIPREP) kit 200 g  1 kit Oral Once Esterwood, Amy S, PA-C      . zinc oxide (BALMEX) 82.4 % cream 1 application  1 application Topical BID Rise Patience, MD   1 application at 23/53/61 1000    Allergies as of 04/18/2017 - Review Complete 04/18/2017  Allergen Reaction Noted  . Feraheme [ferumoxytol] Nausea And Vomiting 02/15/2017    Family History  Problem Relation Age of Onset  . Cancer Mother   . Ovarian cancer Mother   . Diabetes Father     Social History   Social History  . Marital status: Married    Spouse name: N/A  . Number of children: N/A  . Years of education: N/A   Occupational History  . Not on file.   Social History Main Topics  . Smoking status: Never Smoker  . Smokeless tobacco: Never Used  . Alcohol use No  . Drug use: No  . Sexual activity: No  Other Topics Concern  . Not on file   Social History Narrative  . No narrative on file    Review of Systems: Pertinent positive and negative review of systems were noted in the above HPI section.  All other review of systems was otherwise negative.  Physical Exam: Vital signs in last 24 hours: Temp:  [98 F (36.7 C)-99.1 F (37.3 C)] 98 F (36.7 C) (09/27 0548) Pulse Rate:  [87-109] 89 (09/27 0548) Resp:  [16-20] 18 (09/27 0548) BP: (94-121)/(62-85) 105/71 (09/27 0548) SpO2:  [94 %-100 %] 99 % (09/27 0548) Last BM Date: 04/18/17 General:   Alert,  Well-developed, well-nourished, older African-American male, pleasant and cooperative in NAD. He is a paraplegic Head:  Normocephalic and atraumatic. Eyes:  Sclera clear, no icterus.   Conjunctiva pink. Ears:  Normal auditory acuity. Nose:  No deformity, discharge,  or lesions. Mouth:  No deformity or lesions.   Neck:  Supple; no masses or thyromegaly. Lungs:  Clear throughout to auscultation.   No wheezes, crackles, or rhonchi. Heart:  Regular rate and rhythm; no murmurs, clicks, rubs,  or gallops. Abdomen:  Soft, obese, he has a colostomy in the left mid quadrant which is draining dark red blood. Patient also has suprapubic catheter, abdomen soft and nontender bowel sounds present Rectal:   Deferred  Msk:  Symmetrical , paraplegic Pulses:  Normal pulses noted. Extremities:  Without clubbing or edema. Neurologic:  Alert and  oriented x4; paraplegia Skin:  Intact without significant lesions or rashes.. Psych:  Alert and cooperative. Normal mood and affect.  Intake/Output from previous day: 09/26 0701 - 09/27 0700 In: -  Out: 225 [Stool:225] Intake/Output this shift: Total I/O In: 240 [P.O.:240] Out: 250 [Stool:250]  Lab Results:  Recent Labs  04/18/17 1916 04/18/17 2231 04/19/17 0241 04/19/17 0923  WBC 12.2* 12.8* 10.8*  --   HGB 12.8* 12.3* 10.0* 9.3*  HCT 36.3* 34.8* 29.0* 26.3*  PLT 374 355 285  --    BMET  Recent Labs  04/18/17 1916 04/19/17 0241  NA 138 140  K 3.8 4.4  CL 105 108  CO2 23 26  GLUCOSE 113* 155*  BUN 15 16  CREATININE 0.82 0.84  CALCIUM 9.4 8.5*   LFT  Recent Labs  04/18/17 1916  PROT 7.7  ALBUMIN 3.8  AST 23  ALT 23  ALKPHOS 71  BILITOT 0.3   PT/INR No results for input(s): LABPROT, INR in the last 72 hours.     IMPRESSION:  #5 61 year old African-American male, paraplegic as complication of multiple myeloma which is now in remission, who presents with acute lower GI bleed one-week status post colonoscopy with polypectomy and removal of a 15 mm ascending colon polyp which was a tubular adenoma. Patient resumed anticoagulation with his relative post procedure. He had onset of dark red blood per colostomy on 04/17/2017 which has continued. He has had a 4 g drop in hemoglobin. 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 Havery Moros this afternoon. Procedure discussed in detail with patient including risks benefits  and he is agreeable to proceed.   Amy Esterwood  04/19/2017, 11:01 AM

## 2017-04-19 NOTE — Anesthesia Preprocedure Evaluation (Signed)
Anesthesia Evaluation  Patient identified by MRN, date of birth, ID band Patient awake    Reviewed: Allergy & Precautions, NPO status , Patient's Chart, lab work & pertinent test results  Airway Mallampati: II  TM Distance: >3 FB Neck ROM: Full    Dental no notable dental hx.    Pulmonary neg pulmonary ROS,    Pulmonary exam normal breath sounds clear to auscultation       Cardiovascular negative cardio ROS Normal cardiovascular exam Rhythm:Regular Rate:Normal     Neuro/Psych paraplegic negative psych ROS   GI/Hepatic negative GI ROS, Neg liver ROS,   Endo/Other  diabetesAdrenal insufficiency (HCC)  on chronic dexamethasone   Renal/GU negative Renal ROS  negative genitourinary   Musculoskeletal negative musculoskeletal ROS (+)   Abdominal   Peds negative pediatric ROS (+)  Hematology  (+) anemia , Anticoagulated  Multiple myeloma   Anesthesia Other Findings   Reproductive/Obstetrics negative OB ROS                             Anesthesia Physical Anesthesia Plan  ASA: III  Anesthesia Plan: MAC   Post-op Pain Management:    Induction: Intravenous  PONV Risk Score and Plan: 0  Airway Management Planned: Simple Face Mask  Additional Equipment:   Intra-op Plan:   Post-operative Plan:   Informed Consent: I have reviewed the patients History and Physical, chart, labs and discussed the procedure including the risks, benefits and alternatives for the proposed anesthesia with the patient or authorized representative who has indicated his/her understanding and acceptance.   Dental advisory given  Plan Discussed with: CRNA and Surgeon  Anesthesia Plan Comments:         Anesthesia Quick Evaluation

## 2017-04-19 NOTE — Anesthesia Postprocedure Evaluation (Signed)
Anesthesia Post Note  Patient: [name]  Procedure(s) Performed: Procedure(s) (LRB): COLONOSCOPY WITH PROPOFOL (N/A)     Patient location during evaluation: PACU Anesthesia Type: MAC Level of consciousness: awake and alert Pain management: pain level controlled Vital Signs Assessment: post-procedure vital signs reviewed and stable Respiratory status: spontaneous breathing, nonlabored ventilation, respiratory function stable and patient connected to nasal cannula oxygen Cardiovascular status: stable and blood pressure returned to baseline Postop Assessment: no apparent nausea or vomiting Anesthetic complications: no    Last Vitals:  Vitals:   04/19/17 1459 04/19/17 1500  BP: (!) 104/59 (!) 86/55  Pulse: (!) 106 (!) 101  Resp: 20 18  Temp: 36.7 C   SpO2: 99% 100%    Last Pain:  Vitals:   04/19/17 1459  TempSrc: Oral                 [name]

## 2017-04-19 NOTE — Transfer of Care (Signed)
Immediate Anesthesia Transfer of Care Note  Patient: [name]  Procedure(s) Performed: Procedure(s): COLONOSCOPY WITH PROPOFOL (N/A)  Patient Location: PACU  Anesthesia Type:MAC  Level of Consciousness: awake, alert  and oriented  Airway & Oxygen Therapy: Patient Spontanous Breathing and Patient connected to face mask oxygen  Post-op Assessment: Report given to RN and Post -op Vital signs reviewed and stable  Post vital signs: Reviewed and stable  Last Vitals:  Vitals:   04/19/17 0548 04/19/17 1335  BP: 105/71 100/64  Pulse: 89   Resp: 18 (!) 22  Temp: 36.7 C 36.7 C  SpO2: 99% 98%    Last Pain:  Vitals:   04/19/17 1335  TempSrc: Oral         Complications: No apparent anesthesia complications

## 2017-04-19 NOTE — Consult Note (Signed)
Consultation  Referring Provider: [facility] Tyrell Antonio  Primary Care Physician:  [name], MD Primary Gastroenterologist:  Dr.Perry  Reason for Consultation:  Acute post polypectomy bleed  HPI: [name] is a 61 y.o. male , who was initially seen in consultation in our office on 02/15/2017 with complaints of rectal bleeding. Patient describes some oozing from his rectal stump. Patient has a complicated past medical history with multiple myeloma, complicated by paraplegia in 2013. He is currently in remission. He had complication of neurogenic bladder as well as. He has had bilateral DVTs about 2-3 years ago and has been on Xarelto since. He underwent emergency laparotomy, sigmoid colostomy in 2013 for perforated diverticulitis and has a permanent colostomy. He had not had any prior colonoscopy. Patient underwent colonoscopy with Dr. Henrene Pastor one week ago on 04/12/2017. The rectal stump was evaluated and unremarkable. He eats scope was used through his colostomy with finding of 15 millimeter polyp in the ascending colon which was removed. Path returned showing a tubular adenoma. Patient resumed his Xarelto after the colonoscopy. He says he started noticing some blood in his colostomy about 48 hours ago. This has persisted and has been dark red in color. He has not noted any abdominal pain or cramping ,he has felt a little bit weak. He presented to the emergency room last evening and was admitted. He has been hemodynamically stable. His last dose of Xarelto was yesterday about 4 PM. Reviewing his labs baseline hemoglobin 13.6 in August 2018, on arrival last night hemoglobin 12.8. He is drop to 9.3 this morning.   Past Medical History:  Diagnosis Date  . Adrenal insufficiency (HCC)    on chronic dexamethasone  . Anemia   . Cancer ([place])   . Coagulopathy ([place])    on xeralto/ s/p DVT while on coumadin,  IVC in place  . Diabetes mellitus without complication ([place])    type 2   . Gross hematuria 7/14   post foley cath procedure  . History of blood transfusion 7/14  . Multiple myeloma    thoracic T8 with paraplegia s/p resection- on chemo at visit 10/13/10  . Multiple myeloma   . Multiple myeloma without mention of remission   . Neurogenic bladder   . Neurogenic bowel   . Paraplegia ([place])   . Partial small bowel obstruction ([place]) during dec 2011 admission    Past Surgical History:  Procedure Laterality Date  . COLONOSCOPY WITH PROPOFOL N/A 04/12/2017   Procedure: COLONOSCOPY WITH PROPOFOL;  Surgeon: Irene Shipper, MD;  Location: [facility];  Service: Endoscopy;  Laterality: N/A;  . COLOSTOMY  07/20/2011   Procedure: COLOSTOMY;  Surgeon: Judieth Keens, DO;  Location: [facility] OR;  Service: General;;  . COLOSTOMY REVISION  07/20/2011   Procedure: COLON RESECTION SIGMOID;  Surgeon: Judieth Keens, DO;  Location: [place] [place];  Service: General;;  . CYSTOSCOPY N/A 04/04/2013   Procedure: CYSTOSCOPY WITH LITHALOPAXY;  Surgeon: Alexis Frock, MD;  Location: [facility] ORS;  Service: Urology;  Laterality: N/A;  . INSERTION OF SUPRAPUBIC CATHETER N/A 04/04/2013   Procedure: INSERTION OF SUPRAPUBIC CATHETER;  Surgeon: Alexis Frock, MD;  Location: [facility];  Service: Urology;  Laterality: N/A;  . LAPAROTOMY  07/20/2011   Procedure: EXPLORATORY LAPAROTOMY;  Surgeon: Judieth Keens, DO;  Location: [place];  Service: General;  Laterality: N/A;  . myeloma thoracic T8 with parpaplegia s/p thoracotomy and thoracic T7-9 cage placement on Dec 26th 2011  07/18/10    Prior to  Admission medications   Medication Sig Start Date End Date Taking? Authorizing Provider  baclofen (LIORESAL) 20 MG tablet Take 20 mg by mouth 2 (two) times daily.   Yes [provider]  brinzolamide (AZOPT) 1 % ophthalmic suspension Place 1 drop into both eyes 2 (two) times daily.   Yes [provider]  latanoprost (XALATAN) 0.005 % ophthalmic solution Place 1 drop into both eyes at bedtime.    Yes [provider]  metFORMIN (GLUCOPHAGE) 500 MG tablet Take 500 mg by mouth 2 (two) times daily with a meal.   Yes [provider]  Rivaroxaban (XARELTO) 20 MG TABS tablet Take 20 mg by mouth daily with supper.   Yes [provider]  Skin Protectants, Misc. (EUCERIN) cream Apply 1 application topically 2 (two) times daily as needed for dry skin.    Yes [provider]  zinc oxide (BALMEX) 11.3 % CREA cream Apply 1 application topically 2 (two) times daily.   Yes [provider]    Current Facility-Administered Medications  Medication Dose Route Frequency Provider Last Rate Last Dose  . 0.9 %  sodium chloride infusion   Intravenous Continuous Regalado, Belkys A, MD 100 mL/hr at 04/19/17 0904    . acetaminophen (TYLENOL) tablet 650 mg  650 mg Oral Q6H PRN Rise Patience, MD       Or  . acetaminophen (TYLENOL) suppository 650 mg  650 mg Rectal Q6H PRN Rise Patience, MD      . baclofen (LIORESAL) tablet 20 mg  20 mg Oral BID Rise Patience, MD   20 mg at 04/19/17 2229  . brinzolamide (AZOPT) 1 % ophthalmic suspension 1 drop  1 drop Both Eyes BID Rise Patience, MD   1 drop at 04/19/17 0904  . [START ON 04/20/2017] Influenza vac split quadrivalent PF (FLUARIX) injection 0.5 mL  0.5 mL Intramuscular Tomorrow-1000 Gean Birchwood N, MD      . insulin aspart (novoLOG) injection 0-9 Units  0-9 Units Subcutaneous TID WC Rise Patience, MD      . latanoprost (XALATAN) 0.005 % ophthalmic solution 1 drop  1 drop Both Eyes QHS Rise Patience, MD   1 drop at 04/19/17 0127  . ondansetron (ZOFRAN) tablet 4 mg  4 mg Oral Q6H PRN Rise Patience, MD       Or  . ondansetron [facility]) injection 4 mg  4 mg Intravenous Q6H PRN Rise Patience, MD      . peg 3350 powder (MOVIPREP) kit 200 g  1 kit Oral Once Esterwood, Amy S, PA-C      . zinc oxide (BALMEX) 79.8 % cream 1 application  1 application Topical BID Rise Patience, MD   1 application at 92/11/94 1000    Allergies as of 04/18/2017 - Review Complete 04/18/2017  Allergen Reaction Noted  . Feraheme [ferumoxytol] Nausea And Vomiting 02/15/2017    Family History  Problem Relation Age of Onset  . Cancer Mother   . Ovarian cancer Mother   . Diabetes Father     Social History   Social History  . Marital status: Married    Spouse name: N/A  . Number of children: N/A  . Years of education: N/A   Occupational History  . Not on file.   Social History Main Topics  . Smoking status: Never Smoker  . Smokeless tobacco: Never Used  . Alcohol use No  . Drug use: No  . Sexual activity: No  Other Topics Concern  . Not on file   Social History Narrative  . No narrative on file    Review of Systems: Pertinent positive and negative review of systems were noted in the above HPI section.  All other review of systems was otherwise negative.  Physical Exam: Vital signs in last 24 hours: Temp:  [98 F (36.7 C)-99.1 F (37.3 C)] 98 F (36.7 C) (09/27 0548) Pulse Rate:  [87-109] 89 (09/27 0548) Resp:  [16-20] 18 (09/27 0548) BP: (94-121)/(62-85) 105/71 (09/27 0548) SpO2:  [94 %-100 %] 99 % (09/27 0548) Last BM Date: 04/18/17 General:   Alert,  Well-developed, well-nourished, older African-American male, pleasant and cooperative in NAD. He is a paraplegic Head:  Normocephalic and atraumatic. Eyes:  Sclera clear, no icterus.   Conjunctiva pink. Ears:  Normal auditory acuity. Nose:  No deformity, discharge,  or lesions. Mouth:  No deformity or lesions.   Neck:  Supple; no masses or thyromegaly. Lungs:  Clear throughout to auscultation.   No wheezes, crackles, or rhonchi. Heart:  Regular rate and rhythm; no murmurs, clicks, rubs,  or gallops. Abdomen:  Soft, obese, he has a colostomy in the left mid quadrant which is draining dark red blood. Patient also has suprapubic catheter, abdomen soft and nontender bowel sounds present Rectal:   Deferred  Msk:  Symmetrical , paraplegic Pulses:  Normal pulses noted. Extremities:  Without clubbing or edema. Neurologic:  Alert and  oriented x4; paraplegia Skin:  Intact without significant lesions or rashes.. Psych:  Alert and cooperative. Normal mood and affect.  Intake/Output from previous day: 09/26 0701 - 09/27 0700 In: -  Out: 225 [Stool:225] Intake/Output this shift: Total I/O In: 240 [P.O.:240] Out: 250 [Stool:250]  Lab Results:  Recent Labs  04/18/17 1916 04/18/17 2231 04/19/17 0241 04/19/17 0923  WBC 12.2* 12.8* 10.8*  --   HGB 12.8* 12.3* 10.0* 9.3*  HCT 36.3* 34.8* 29.0* 26.3*  PLT 374 355 285  --    BMET  Recent Labs  04/18/17 1916 04/19/17 0241  NA 138 140  K 3.8 4.4  CL 105 108  CO2 23 26  GLUCOSE 113* 155*  BUN 15 16  CREATININE 0.82 0.84  CALCIUM 9.4 8.5*   LFT  Recent Labs  04/18/17 1916  PROT 7.7  ALBUMIN 3.8  AST 23  ALT 23  ALKPHOS 71  BILITOT 0.3   PT/INR No results for input(s): LABPROT, INR in the last 72 hours.     IMPRESSION:  #19 61 year old African-American male, paraplegic as complication of multiple myeloma which is now in remission, who presents with acute lower GI bleed one-week status post colonoscopy with polypectomy and removal of a 15 mm ascending colon polyp which was a tubular adenoma. Patient resumed anticoagulation with his relative post procedure. He had onset of dark red blood per colostomy on 04/17/2017 which has continued. He has had a 4 g drop in hemoglobin. 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 Havery Moros this afternoon. Procedure discussed in detail with patient including risks benefits  and he is agreeable to proceed.   Amy Esterwood  04/19/2017, 11:01 AM

## 2017-04-20 ENCOUNTER — Encounter (HOSPITAL_COMMUNITY): Payer: Self-pay | Admitting: Gastroenterology

## 2017-04-20 DIAGNOSIS — D649 Anemia, unspecified: Secondary | ICD-10-CM

## 2017-04-20 DIAGNOSIS — Z9889 Other specified postprocedural states: Secondary | ICD-10-CM

## 2017-04-20 LAB — GLUCOSE, CAPILLARY
Glucose-Capillary: 115 mg/dL — ABNORMAL HIGH (ref 65–99)
Glucose-Capillary: 103 mg/dL — ABNORMAL HIGH (ref 65–99)
Glucose-Capillary: 85 mg/dL (ref 65–99)
Glucose-Capillary: 120 mg/dL — ABNORMAL HIGH (ref 65–99)

## 2017-04-20 LAB — CBC
HCT: 26.7 % — ABNORMAL LOW (ref 39.0–52.0)
Hemoglobin: 9.5 g/dL — ABNORMAL LOW (ref 13.0–17.0)
MCH: 26.5 pg (ref 26.0–34.0)
MCHC: 35.6 g/dL (ref 30.0–36.0)
MCV: 74.6 fL — ABNORMAL LOW (ref 78.0–100.0)
Platelets: 225 10*3/uL (ref 150–400)
RBC: 3.58 MIL/uL — ABNORMAL LOW (ref 4.22–5.81)
RDW: 17.8 % — ABNORMAL HIGH (ref 11.5–15.5)
WBC: 13.1 10*3/uL — ABNORMAL HIGH (ref 4.0–10.5)

## 2017-04-20 LAB — BASIC METABOLIC PANEL
Anion gap: 6 (ref 5–15)
BUN: 10 mg/dL (ref 6–20)
CO2: 23 mmol/L (ref 22–32)
Calcium: 8.3 mg/dL — ABNORMAL LOW (ref 8.9–10.3)
Chloride: 112 mmol/L — ABNORMAL HIGH (ref 101–111)
Creatinine, Ser: 0.69 mg/dL (ref 0.61–1.24)
GFR calc Af Amer: >60 mL/min (ref >60)
GFR calc non Af Amer: >60 mL/min (ref >60)
Glucose, Bld: 124 mg/dL — ABNORMAL HIGH (ref 65–99)
Potassium: 4 mmol/L (ref 3.5–5.1)
Sodium: 141 mmol/L (ref 135–145)

## 2017-04-20 LAB — HEMOGLOBIN AND HEMATOCRIT, BLOOD
HCT: 26.2 % — ABNORMAL LOW (ref 39.0–52.0)
HCT: 24.7 % — ABNORMAL LOW (ref 39.0–52.0)
HCT: 25.9 % — ABNORMAL LOW (ref 39.0–52.0)
Hemoglobin: 9.3 g/dL — ABNORMAL LOW (ref 13.0–17.0)
Hemoglobin: 8.7 g/dL — ABNORMAL LOW (ref 13.0–17.0)
Hemoglobin: 9.1 g/dL — ABNORMAL LOW (ref 13.0–17.0)

## 2017-04-20 LAB — PREPARE RBC (CROSSMATCH)

## 2017-04-20 MED ORDER — SODIUM CHLORIDE 0.9 % IV SOLN: Freq: Once | INTRAVENOUS | Status: DC

## 2017-04-20 NOTE — Progress Notes (Signed)
      Progress Note   Subjective  Patient did well overnight. No abdominal pain. No further bloody output in ostomy. He had a PRBC transfusion, responded well to it. No complaints today. Hungry.   Objective   Vital signs in last 24 hours: Temp:  [97.9 F (36.6 C)-98.6 F (37 C)] 98 F (36.7 C) (09/28 0623) Pulse Rate:  [58-106] 71 (09/28 0623) Resp:  [14-22] 18 (09/28 0623) BP: (86-109)/(53-66) 89/58 (09/28 0118) SpO2:  [97 %-100 %] 100 % (09/28 0623) Last BM Date: 04/19/17 General:    AA male in NAD Heart:  Regular rate and rhythm; no murmurs Lungs: Respirations even and unlabored, lungs CTA bilaterally Abdomen:  Soft, nontender. Ostomy in LLQ, bag empty Extremities:  Without edema. Neurologic:  Alert and oriented,  grossly normal neurologically. Psych:  Cooperative. Normal mood and affect.  Intake/Output from previous day: 09/27 0701 - 09/28 0700 In: 2035 [P.O.:240; I.V.:1200; Blood:345; IV Piggyback:250] Out: 2100 [Urine:1200; Stool:900] Intake/Output this shift: No intake/output data recorded.  Lab Results:  Recent Labs  04/18/17 2231 04/19/17 0241  04/19/17 1505 04/20/17 0221 04/20/17 0550  WBC 12.8* 10.8*  --   --   --  13.1*  HGB 12.3* 10.0*  < > 7.6* 9.3* 9.5*  HCT 34.8* 29.0*  < > 21.7* 26.2* 26.7*  PLT 355 285  --   --   --  225  < > = values in this interval not displayed. BMET  Recent Labs  04/18/17 1916 04/19/17 0241 04/20/17 0550  NA 138 140 141  K 3.8 4.4 4.0  CL 105 108 112*  CO2 23 26 23   GLUCOSE 113* 155* 124*  BUN 15 16 10   CREATININE 0.82 0.84 0.69  CALCIUM 9.4 8.5* 8.3*   LFT  Recent Labs  04/18/17 1916  PROT 7.7  ALBUMIN 3.8  AST 23  ALT 23  ALKPHOS 71  BILITOT 0.3   PT/INR No results for input(s): LABPROT, INR in the last 72 hours.  Studies/Results: No results found.     Assessment / Plan:   61 y/o male with history of DVTs on Xarelto, who presented with post-polypectomy bleed. Colonoscopy on 04/12/17 was  remarkable for advanced right sided adenoma removed with hot snare. Colonoscopy done yesterday, large volume of blood in the colon with large clot at the polypectomy site which was removed, revealing ulcer bed with active bleeding. Hemostasis was technically challenging to achieve - it took 6 hemostasis clips and epinephrine injection to obtain hemostasis. He had 2 units of PRBC transfusion last night to which he responded well. He denies having any bloody output since the procedure. Hgb stable. Feeling well today, no complaints. Last dose of Xarelto at 4 PM on 9/26, most of it should be out of his system today.   Recommend: - advance to soft diet now - monitor for recurrent bleeding, would like to watch him at least for 24 hours since hemostasis was achieved to ensure no recurrent bleeding. Possible discharge later today versus tomorrow morning.  - if okay with primary service, I would hold Xarelto for another week if possible (this would take him out to 2 weeks post polypectomy)  - recheck Hgb this afternoon  We will see him later this afternoon and discuss timing of discharge with him, pending course and lab results. He agreed.  No Name Cellar, MD [facility] Pager [phone]

## 2017-04-20 NOTE — Progress Notes (Signed)
PROGRESS NOTE    [name]  BPZ:[id number] DOB: 13-Jul-1956 DOA: 04/18/2017 PCP: [name], MD    Brief Narrative: [name] is a 61 y.o. male with history of multiple myeloma in remission complicated with paraplegia, history of perforated viscus status post colostomy placement, diabetes mellitus and as per the records adrenal insufficiency had a colonoscopy done a week ago on 04/12/2017 during which one polyp was removed from the ascending colon. 3 days ago patient started noticing some blood-tinged bowel movement. Last evening he became frankly bloody and started filling up the colostomy bag. Denies any abdominal pain nausea vomiting fever or chills.  ED Course: In the ER patient remained hemodynamically stable. Hemoglobin last was around 13.6 and today is around 12.8. On-call gastroenterologist Dr. Ardis Hughs was consulted and patient is being admitted for acute GI bleed likely lower GI bleed in the setting of xarelto.    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  IV fluids.  Hold xarelto, plan to hold for one week.  Underwent clip and epi injection of site of bleeding.  Hb decreased to 8.7. He has small amount of blood in colostomy bag. Will transfuse another unit PRBC>   2- Acute blood loss anemia; related to number one.  2 Units PRBC> 9-27 Will transfuse another unit PRBC 9-28  3-MM, with paraplegia, remission.   4-DM; SSI.   History of DVT 2015; holding xarelto due to GI bleed.  Has IVC filter.   5-Leukocytosis;  urine cloudy. He has supra-pubic catheter. Plan to check UA and change supra-pubic catheter     DVT prophylaxis: scd.  Code Status: full code.  Family Communication: care discussed with patient.  Disposition Plan: home when stable.    Consultants:   gi   Procedures: colonoscopy    Antimicrobials: none   Subjective: No complaints. He has small amount of blood in colostomy bag   Objective: Vitals:   04/19/17 2204 04/20/17 0118 04/20/17 0623 04/20/17 1337  BP: (!) 99/54 (!) 89/58  (!) 100/55  Pulse: 72 (!) 58 71 71  Resp: 18 16 18 18   Temp: 98 F (36.7 C) 98 F (36.7 C) 98 F (36.7 C) 98.1 F (36.7 C)  TempSrc: Oral Oral Oral Oral  SpO2: 100% 100% 100% 100%    Intake/Output Summary (Last 24 hours) at 04/20/17 1620 Last data filed at 04/20/17 1242  Gross per 24 hour  Intake             1125 ml  Output                0 ml  Net             1125 ml   There were no vitals filed for this visit.  Examination:  General exam: NAD Respiratory system: Normal Respiratory effort. CTA Cardiovascular system: S 1, S 2 RRR, no edema  Gastrointestinal system: Abdomen is soft, nt ,  Normal bowel sounds heard. Colostomy ion place, small amount of blood. Supra-pubic  catheter  Central nervous system: alert and oriented     Data Reviewed: I have personally reviewed following labs and imaging studies  CBC:  Recent Labs Lab 04/18/17 1916 04/18/17 2231 04/19/17 0241 04/19/17 0923 04/19/17 1505 04/20/17 0221 04/20/17 0550 04/20/17 1407  WBC 12.2* 12.8* 10.8*  --   --   --  13.1*  --   HGB 12.8* 12.3* 10.0* 9.3* 7.6* 9.3* 9.5* 8.7*  HCT 36.3* 34.8* 29.0* 26.3* 21.7* 26.2* 26.7* 24.7*  MCV 71.9* 72.2* 72.9*  --   --   --  74.6*  --   PLT 374 355 285  --   --   --  225  --    Basic Metabolic Panel:  Recent Labs Lab 04/18/17 1916 04/19/17 0241 04/20/17 0550  NA 138 140 141  K 3.8 4.4 4.0  CL 105 108 112*  CO2 23 26 23   GLUCOSE 113* 155* 124*  BUN 15 16 10     CREATININE 0.82 0.84 0.69  CALCIUM 9.4 8.5* 8.3*   GFR: Estimated Creatinine Clearance: 110.9 mL/min (by C-G formula based on SCr of 0.69 mg/dL). Liver Function Tests:  Recent Labs Lab 04/18/17 1916  AST 23  ALT 23  ALKPHOS 71  BILITOT 0.3  PROT 7.7  ALBUMIN 3.8   No results for input(s): LIPASE, AMYLASE in the last 168 hours. No results for input(s): AMMONIA in the last 168 hours. Coagulation Profile: No results for input(s): INR, PROTIME in the last 168 hours. Cardiac Enzymes: No results for input(s): CKTOTAL, CKMB, CKMBINDEX, TROPONINI in the last 168 hours. BNP (last 3 results) No results for input(s): PROBNP in the last 8760 hours. HbA1C: No results for input(s): HGBA1C in the last 72 hours. CBG:  Recent Labs Lab 04/19/17 1633 04/19/17 2234 04/19/17 2323 04/20/17 0718 04/20/17 1124  GLUCAP 163* 140* 137* 115* 103*   Lipid Profile: No results for input(s): CHOL, HDL, LDLCALC, TRIG, CHOLHDL, LDLDIRECT in the last 72 hours. Thyroid Function Tests: No results for input(s): TSH, T4TOTAL, FREET4, T3FREE, THYROIDAB in the last 72 hours. Anemia Panel: No results for input(s): VITAMINB12, FOLATE, FERRITIN, TIBC, IRON, RETICCTPCT in the last 72 hours. Sepsis Labs: No results for input(s): PROCALCITON, LATICACIDVEN in the last 168 hours.  No results found for this or any previous visit (from the past 240 hour(s)).       Radiology Studies: No results found.      Scheduled Meds: . baclofen  20 mg Oral BID  . brinzolamide  1 drop Both Eyes BID  . insulin aspart  0-9 Units Subcutaneous TID WC  . latanoprost  1 drop Both Eyes QHS  . zinc oxide  1 application Topical BID   Continuous Infusions: . sodium chloride 100 mL/hr at 04/20/17 0915  . lactated ringers Stopped (04/19/17 1526)     LOS: 2 days    Time spent: 35 minutes,     Christan Defranco, Cassie Freer, MD [facility] Pager [phone]  If 7PM-7AM, please contact  night-coverage [email or web address] Password [email or web address] 04/20/2017, 4:20 PM

## 2017-04-20 NOTE — Progress Notes (Signed)
GI UPDATE:  I came back to evaluate the patient. He has had no ostomy output today. Small amount of old blood in the ostomy bag. Hgb slightly downtrended today, but could be equilibration. If he is bleeding to cause his anemia, we will see it come out. He had a significant amount of blood in his colon yesterday when he bled, he may likely pass some more blood. I don't think he is actively bleeding however based on what I am seeing in his ostomy bag today - nursing states it is the same bag as he had this morning and there is no significant contents in it. I cancelled his blood transfusion this evening. I would repeat Hgb in a few hours - if it remains stable, continue to observe. If no bleeding overnight he can be discharged tomorrow AM. If he does have recurrent bleeding overnight, please contact us.  [place] Cellar, MD [facility] Pager [phone]

## 2017-04-20 NOTE — Care Management Note (Signed)
Case Management Note  Patient Details  Name: [name] MRN: [id number] Date of Birth: 1955-08-12  Subjective/Objective:                   61 y.o. male , who was initially seen in consultation in our office on 02/15/2017 with complaints of rectal bleeding. Patient describes some oozing from his rectal stump. Patient has a complicated past medical history with multiple myeloma, complicated by paraplegia in 2013. He is currently in remission. He had complication of neurogenic bladder as well as. He has had bilateral DVTs about 2-3 years ago and has been on Xarelto since. He underwent emergency laparotomy, sigmoid colostomy in 2013 for perforated diverticulitis and has a permanent colostomy. He had not had any prior colonoscopy. Patient underwent colonoscopy with Dr. Henrene Pastor one week ago on 04/12/2017. The rectal stump was evaluated and unremarkable. He eats scope was used through his colostomy with finding of 15 millimeter polyp in the ascending colon which was removed. Path returned showing a tubular adenoma. Patient resumed his Xarelto after the colonoscopy. He says he started noticing some blood in his colostomy about 48 hours ago. This has persisted and has been dark red in color. He has not noted any abdominal pain or cramping ,he has felt a little bit weak. He presented to the emergency room last evening and was admitted. He has been hemodynamically stable. His last dose of Xarelto was yesterday about 4 PM. Reviewing his labs baseline hemoglobin 13.6 in August 2018, on arrival last night hemoglobin 12.8. He is drop to 9.3 this morning.  Action/Plan:  [facility] PATIENT Date:  April 20, 2017 Chart reviewed for concurrent status and case management needs.  Will continue to follow patient progress.  Discharge Planning: following for needs  Expected discharge date: April 23, 2017  Velva Harman, BSN, [place], [place]   Expected Discharge Date:                  Expected  Discharge Plan:  Home/Self Care  In-House Referral:     Discharge planning Services  CM Consult  Post Acute Care Choice:    Choice offered to:     DME Arranged:    DME Agency:     HH Arranged:    HH Agency:     Status of Service:  In process, will continue to follow  If discussed at Long Length of Stay Meetings, dates discussed:    Additional Comments:  Leeroy Cha, RN 04/20/2017, 9:38 AM

## 2017-04-21 DIAGNOSIS — Z7901 Long term (current) use of anticoagulants: Secondary | ICD-10-CM

## 2017-04-21 DIAGNOSIS — K921 Melena: Secondary | ICD-10-CM

## 2017-04-21 DIAGNOSIS — D62 Acute posthemorrhagic anemia: Secondary | ICD-10-CM

## 2017-04-21 LAB — URINALYSIS, ROUTINE W REFLEX MICROSCOPIC
Bacteria, UA: NONE SEEN
Bilirubin Urine: NEGATIVE
Glucose, UA: NEGATIVE mg/dL
Hgb urine dipstick: NEGATIVE
Ketones, ur: NEGATIVE mg/dL
Nitrite: NEGATIVE
Protein, ur: NEGATIVE mg/dL
Specific Gravity, Urine: 1.006 (ref 1.005–1.030)
pH: 5 (ref 5.0–8.0)

## 2017-04-21 LAB — CBC
HCT: 24.4 % — ABNORMAL LOW (ref 39.0–52.0)
Hemoglobin: 8.6 g/dL — ABNORMAL LOW (ref 13.0–17.0)
MCH: 27 pg (ref 26.0–34.0)
MCHC: 35.2 g/dL (ref 30.0–36.0)
MCV: 76.5 fL — ABNORMAL LOW (ref 78.0–100.0)
Platelets: 230 10*3/uL (ref 150–400)
RBC: 3.19 MIL/uL — ABNORMAL LOW (ref 4.22–5.81)
RDW: 17.9 % — ABNORMAL HIGH (ref 11.5–15.5)
WBC: 12.7 10*3/uL — ABNORMAL HIGH (ref 4.0–10.5)

## 2017-04-21 LAB — GLUCOSE, CAPILLARY
Glucose-Capillary: 73 mg/dL (ref 65–99)
Glucose-Capillary: 103 mg/dL — ABNORMAL HIGH (ref 65–99)
Glucose-Capillary: 100 mg/dL — ABNORMAL HIGH (ref 65–99)
Glucose-Capillary: 131 mg/dL — ABNORMAL HIGH (ref 65–99)

## 2017-04-21 LAB — BASIC METABOLIC PANEL
Anion gap: 6 (ref 5–15)
BUN: 8 mg/dL (ref 6–20)
CO2: 25 mmol/L (ref 22–32)
Calcium: 8.3 mg/dL — ABNORMAL LOW (ref 8.9–10.3)
Chloride: 113 mmol/L — ABNORMAL HIGH (ref 101–111)
Creatinine, Ser: 0.66 mg/dL (ref 0.61–1.24)
GFR calc Af Amer: >60 mL/min (ref >60)
GFR calc non Af Amer: >60 mL/min (ref >60)
Glucose, Bld: 90 mg/dL (ref 65–99)
Potassium: 3.6 mmol/L (ref 3.5–5.1)
Sodium: 144 mmol/L (ref 135–145)

## 2017-04-21 LAB — HEMOGLOBIN AND HEMATOCRIT, BLOOD
HCT: 23.3 % — ABNORMAL LOW (ref 39.0–52.0)
Hemoglobin: 8.3 g/dL — ABNORMAL LOW (ref 13.0–17.0)

## 2017-04-21 MED ORDER — DEXTROSE 5 % IV SOLN
1.0000 g | INTRAVENOUS | Status: DC
  Administered 2017-04-21: 1 g via INTRAVENOUS
  Filled 2017-04-21 (×2): qty 10

## 2017-04-21 NOTE — Progress Notes (Signed)
PROGRESS NOTE    [name]  TJQ:[id number] DOB: [birth year] DOA: 04/18/2017 PCP: [name], MD    Brief Narrative: [name] is a 61 y.o. male with history of multiple myeloma in remission complicated with paraplegia, history of perforated viscus status post colostomy placement, diabetes mellitus and as per the records adrenal insufficiency had a colonoscopy done a week ago on 04/12/2017 during which one polyp was removed from the ascending colon. 3 days ago patient started noticing some blood-tinged bowel movement. Last evening he became frankly bloody and started filling up the colostomy bag. Denies any abdominal pain nausea vomiting fever or chills.  ED Course: In the ER patient remained hemodynamically stable. Hemoglobin last was around 13.6 and today is around 12.8. On-call gastroenterologist Dr. Ardis Hughs was consulted and patient is being admitted for acute GI bleed likely lower GI bleed in the setting of xarelto.    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  IV fluids.  Hold xarelto, plan to hold for one week.  Underwent clipped  and epi injection of site of bleeding.  He didn't received Blood transfusion 9-28. Hb at 8.6. No BM. Plan to repeat hb tonight.   2- Acute blood loss anemia; related to number one.  2 Units PRBC> 9-27 Hb stable.   3-MM, with paraplegia, remission.   4-DM; SSI.   History of DVT 2015; holding xarelto due to GI bleed.  Has IVC filter.   5-UTI; Leukocytosis; urine cloudy. He has supra-pubic  catheter. change supra-pubic catheter  UA with large leukocytes.  Due to leukocytosis , will start ceftriaxone. Follow urine culture.   DVT prophylaxis: scd.  Code Status: full code.  Family Communication: care discussed with patient.  Disposition Plan: home in 24 hours.    Consultants:   gi   Procedures: colonoscopy    Antimicrobials: none   Subjective: No BM yet.   Objective: Vitals:   04/20/17 1337 04/20/17 1722 04/20/17 2127 04/21/17 0607  BP: (!) 100/55 101/67 (!) 111/57 104/77  Pulse: 71 71 69 63  Resp: 18 17 17 18   Temp: 98.1 F (36.7 C) 98.5 F (36.9 C) 98.4 F (36.9 C) 98.2 F (36.8 C)  TempSrc: Oral Oral Oral Oral  SpO2: 100% 98% 98% 100%    Intake/Output Summary (Last 24 hours) at 04/21/17 1244 Last data filed at 04/21/17 1000  Gross per 24 hour  Intake              240 ml  Output             2950 ml  Net            -2710 ml   There were no vitals filed for this visit.  Examination:  General exam: NAD Respiratory system: CTA Cardiovascular system: S 1 , S 2 RRR Gastrointestinal system: BS present, soft. Normal bowel sounds heard. Colostomy in place, bag empty, supra-pubic catheter in place.  Central nervous system: alert and oriented     Data Reviewed:  I have personally reviewed following labs and imaging studies  CBC:  Recent Labs Lab 04/18/17 1916 04/18/17 2231 04/19/17 0241  04/20/17 0221 04/20/17 0550 04/20/17 1407 04/20/17 1937 04/21/17 0607  WBC 12.2* 12.8* 10.8*  --   --  13.1*  --   --  12.7*  HGB 12.8* 12.3* 10.0*  < > 9.3* 9.5* 8.7* 9.1* 8.6*  HCT 36.3* 34.8* 29.0*  < > 26.2* 26.7* 24.7* 25.9* 24.4*  MCV 71.9* 72.2* 72.9*  --   --  74.6*  --   --  76.5*  PLT 374 355 285  --   --  225  --   --  230  < > = values in this interval not displayed. Basic Metabolic Panel:  Recent Labs Lab 04/18/17 1916 04/19/17 0241 04/20/17 0550 04/21/17 0607  NA 138 140 141 144  K 3.8 4.4 4.0 3.6  CL 105 108 112* 113*  CO2 23 26  23 25   GLUCOSE 113* 155* 124* 90  BUN 15 16 10 8   CREATININE 0.82 0.84 0.69 0.66  CALCIUM 9.4 8.5* 8.3* 8.3*   GFR: Estimated Creatinine Clearance: 110.9 mL/min (by C-G formula based on SCr of 0.66 mg/dL). Liver Function Tests:  Recent Labs Lab 04/18/17 1916  AST 23  ALT 23  ALKPHOS 71  BILITOT 0.3  PROT 7.7  ALBUMIN 3.8   No results for input(s): LIPASE, AMYLASE in the last 168 hours. No results for input(s): AMMONIA in the last 168 hours. Coagulation Profile: No results for input(s): INR, PROTIME in the last 168 hours. Cardiac Enzymes: No results for input(s): CKTOTAL, CKMB, CKMBINDEX, TROPONINI in the last 168 hours. BNP (last 3 results) No results for input(s): PROBNP in the last 8760 hours. HbA1C: No results for input(s): HGBA1C in the last 72 hours. CBG:  Recent Labs Lab 04/20/17 1124 04/20/17 1709 04/20/17 2125 04/21/17 0800 04/21/17 1204  GLUCAP 103* 85 120* 73 103*   Lipid Profile: No results for input(s): CHOL, HDL, LDLCALC, TRIG, CHOLHDL, LDLDIRECT in the last 72 hours. Thyroid Function Tests: No results for input(s): TSH, T4TOTAL, FREET4, T3FREE, THYROIDAB in the last 72 hours. Anemia Panel: No results for input(s): VITAMINB12, FOLATE, FERRITIN, TIBC, IRON, RETICCTPCT in the last 72 hours. Sepsis Labs: No results for input(s): PROCALCITON, LATICACIDVEN in the last 168 hours.  No results found for this or any previous visit (from the past 240 hour(s)).       Radiology Studies: No results found.      Scheduled Meds: . baclofen  20 mg Oral BID  . brinzolamide  1 drop Both Eyes BID  . insulin aspart  0-9 Units Subcutaneous TID WC  . latanoprost  1 drop Both Eyes QHS  . zinc oxide  1 application Topical BID   Continuous Infusions: . sodium chloride 100 mL/hr at 04/21/17 0450  . cefTRIAXone (ROCEPHIN)  IV    . lactated ringers Stopped (04/19/17 1526)     LOS: 3 days    Time spent: 35 minutes,     Khylon Davies, Cassie Freer, MD [facility] Pager [phone]  If 7PM-7AM, please contact night-coverage [email or web address] Password [facility] 04/21/2017, 12:44 PM

## 2017-04-21 NOTE — Progress Notes (Signed)
This shift suprapubic catheter exchange performed by Urology RN, Sharyn Lull.

## 2017-04-21 NOTE — Progress Notes (Signed)
[place] [facility] Progress Note  Chief Complaint: hematochezia,post-polypectomy bleed  Subjective  History:  Mr. Trevathan has done well overnight, with no further overt lower GI bleeding. He continues to have a small amount of congealed old blood in his colostomy bag, and he says it has not been changed overnight. He denies abdominal pain.He has a good appetite and is having breakfast at present.  ROS: Cardiovascular:  no chest pain Respiratory: no dyspnea  Objective:  Med list reviewed  Vital signs in last 24 hrs: Vitals:   04/20/17 2127 04/21/17 0607  BP: (!) 111/57 104/77  Pulse: 69 63  Resp: 17 18  Temp: 98.4 F (36.9 C) 98.2 F (36.8 C)  SpO2: 98% 100%    Physical Exam   HEENT: sclera anicteric, oral mucosa moist without lesions  Neck: supple, no thyromegaly, JVD or lymphadenopathy  Cardiac: RRR without murmurs, S1S2 heard, no peripheral edema  Pulm: clear to auscultation bilaterally, normal RR and effort noted  Abdomen: soft, no tenderness, with active bowel sounds.colostomy bag removed, there is very little in it, and there is a scant amount of old black blood around the ostomy.  Skin; warm and dry, no jaundice or rash  Recent Labs:   Recent Labs Lab 04/19/17 0241  04/20/17 0550 04/20/17 1407 04/20/17 1937 04/21/17 0607  WBC 10.8*  --  13.1*  --   --  12.7*  HGB 10.0*  < > 9.5* 8.7* 9.1* 8.6*  HCT 29.0*  < > 26.7* 24.7* 25.9* 24.4*  PLT 285  --  225  --   --  230  < > = values in this interval not displayed.  Recent Labs Lab 04/18/17 1916  04/21/17 0607  NA 138  < > 144  K 3.8  < > 3.6  CL 105  < > 113*  CO2 23  < > 25  BUN 15  < > 8  ALBUMIN 3.8  --   --   ALKPHOS 71  --   --   ALT 23  --   --   AST 23  --   --   GLUCOSE 113*  < > 90  < > = values in this interval not displayed. No results for input(s): INR in the last 168 hours.  @ASSESSMENTPLANBEGIN @ Assessment: Hematochezia Acute blood loss anemia Post polypectomy bleed, treated with  multiple clips placed on the ulcer site during colonoscopy by Dr. Havery Moros 2 days ago.This appears to have controlled the bleeding.  He did not receive any transfusion overnight, so his hemoglobin seems to be equilibrating,and I do not think he is continuing to have ongoing GI bleeding.  Plan: I feel he can be discharged home today, but he is concerned that he should only do so after he has had " good clean bowel movement". I have told him that he might not have much for bowel movement over the next day or 2 since he is only just started eating yesterday.Perhaps a repeat hemoglobin check early this afternoon would reassure him. I feel that his oral anticoagulation should be off for a total of a week, which means he can be resumed on 04/26/2017. I am signing off, and I will alert Dr. Scarlette Shorts about the events of this hospital stay so any necessary follow-up can be arranged.   Nelida Meuse III Pager [phone] Mon-Fri 8a-5p [phone] after 5p, weekends, holidays

## 2017-04-22 LAB — TYPE AND SCREEN
ABO/RH(D): O POS
Antibody Screen: NEGATIVE
Unit division: 0
Unit division: 0
Unit division: 0

## 2017-04-22 LAB — BPAM RBC
Blood Product Expiration Date: 201810182359
Blood Product Expiration Date: 201810182359
Blood Product Expiration Date: 201810192359
ISSUE DATE / TIME: 201809271751
ISSUE DATE / TIME: 201809272135
Unit Type and Rh: 5100
Unit Type and Rh: 5100
Unit Type and Rh: 5100

## 2017-04-22 LAB — CBC
HCT: 24.8 % — ABNORMAL LOW (ref 39.0–52.0)
Hemoglobin: 8.5 g/dL — ABNORMAL LOW (ref 13.0–17.0)
MCH: 26.5 pg (ref 26.0–34.0)
MCHC: 34.3 g/dL (ref 30.0–36.0)
MCV: 77.3 fL — ABNORMAL LOW (ref 78.0–100.0)
Platelets: 263 10*3/uL (ref 150–400)
RBC: 3.21 MIL/uL — ABNORMAL LOW (ref 4.22–5.81)
RDW: 18.4 % — ABNORMAL HIGH (ref 11.5–15.5)
WBC: 12.2 10*3/uL — ABNORMAL HIGH (ref 4.0–10.5)

## 2017-04-22 LAB — GLUCOSE, CAPILLARY
Glucose-Capillary: 90 mg/dL (ref 65–99)
Glucose-Capillary: 101 mg/dL — ABNORMAL HIGH (ref 65–99)

## 2017-04-22 MED ORDER — POLYSACCHARIDE IRON COMPLEX 150 MG PO CAPS: 150.0000 mg | ORAL_CAPSULE | Freq: Every day | ORAL | 0 refills | Status: DC

## 2017-04-22 MED ORDER — FERROUS SULFATE 325 (65 FE) MG PO TABS: 325.0000 mg | ORAL_TABLET | Freq: Every day | ORAL | 3 refills | Status: DC

## 2017-04-22 MED ORDER — METHOCARBAMOL 500 MG PO TABS
750.0000 mg | ORAL_TABLET | Freq: Once | ORAL | Status: AC
  Administered 2017-04-22: 750 mg via ORAL
  Filled 2017-04-22: qty 2

## 2017-04-22 MED ORDER — DOCUSATE SODIUM 100 MG PO CAPS
100.0000 mg | ORAL_CAPSULE | Freq: Two times a day (BID) | ORAL | Status: DC
  Administered 2017-04-22: 100 mg via ORAL
  Filled 2017-04-22: qty 1

## 2017-04-22 MED ORDER — CEFUROXIME AXETIL 500 MG PO TABS: 250.0000 mg | ORAL_TABLET | Freq: Two times a day (BID) | ORAL | 0 refills | Status: AC

## 2017-04-22 NOTE — Care Management Note (Signed)
Case Management Note  Patient Details  Name: [name] MRN: [id number] Date of Birth: 04/07/1956  Subjective/Objective:     Lower GI bleeding, paraplegia, Multiple Myeloma in remission,                Action/Plan: Discharge Planning: NCM spoke to pt and states he lives at home with wife. [facility] on [facility] during the day. And he has an aide that comes on the weekend. Pt requesting [facility] transport.   PCP [name] MD  Expected Discharge Date:  04/22/17               Expected Discharge Plan:  Home/Self Care  In-House Referral:  NA  Discharge planning Services  CM Consult  Post Acute Care Choice:  NA Choice offered to:  NA  DME Arranged:  N/A DME Agency:  NA  HH Arranged:  NA HH Agency:  NA  Status of Service:  Completed, signed off  If discussed at [place] of Stay Meetings, dates discussed:    Additional Comments:  Erenest Rasher, RN 04/22/2017, 2:25 PM

## 2017-04-22 NOTE — Progress Notes (Signed)
Pt left at this time with [facility], headed home.

## 2017-04-22 NOTE — Discharge Summary (Signed)
Physician Discharge Summary  Gary NAUERT TJQ:[id number] DOB: 1955/07/26 DOA: 04/18/2017  PCP: [name], MD  Admit date: 04/18/2017 Discharge date: 04/25/2017  Admitted From: Home Disposition:  Home  Recommendations for Outpatient Follow-up:  1. Follow up with PCP in 1-2 weeks 2. Please obtain BMP/CBC in one week 3. Please make sure patient resume xarelto in 1 week if hb remain stable and no further bleed.     Discharge Condition: stable  CODE STATUS; Full code Diet recommendation: Heart Healthy  Brief/Interim Summary: [name] a 61 y.o.malewith history of multiple myeloma in remission complicated with paraplegia, history of perforated viscus status post colostomy placement, diabetes mellitus and as per the records adrenal insufficiency had a colonoscopy done a week ago on 04/12/2017 during which one polyp was removed from the ascending colon. 3 days ago patient started noticing some blood-tinged bowel movement. Last evening he became frankly bloody and started filling up the colostomy bag. Denies any abdominal pain nausea vomiting fever or chills.  ED Course:In the ER patient remained hemodynamically stable. Hemoglobin last was around 13.6 and today is around 12.8. On-call gastroenterologist Dr. Ardis Hughs was consulted and patient is being admitted for acute GI bleed likely lower GI bleed in the setting of xarelto.    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  IV fluids.  Hold xarelto, plan to hold for one week.   Underwent clipped  and epi injection of site of bleeding.  Hb stable   2- Acute blood loss anemia; related to number one.  2 Units PRBC> 9-27 Hb stable.   3-MM, with paraplegia, remission.   4-DM; SSI.   History of DVT 2015; holding xarelto due to GI bleed. Resume xarelto in 1 week.  Has IVC filter.   5-UTI; Leukocytosis; urine cloudy. He has supra-pubic catheter. change supra-pubic catheter  UA with large leukocytes.  Due to leukocytosis , started on ceftriaxone. He was discharge on cefuroxime.    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      Medication List    STOP taking these medications   XARELTO 20 MG Tabs tablet Generic drug:  rivaroxaban     TAKE these medications   baclofen  20 MG tablet Commonly known as:  LIORESAL Take 20 mg by mouth 2 (two) times daily.   brinzolamide 1 % ophthalmic suspension Commonly known as:  AZOPT Place 1 drop into both eyes 2 (two) times daily.   cefUROXime 500 MG tablet Commonly known as:  CEFTIN Take 0.5 tablets (250 mg total) by mouth 2 (two) times daily with a meal.   eucerin cream Apply 1 application topically 2 (two) times daily as needed for dry skin.   iron polysaccharides 150 MG capsule Commonly known as:  NU-IRON Take 1 capsule (150 mg total)  by mouth daily.   latanoprost 0.005 % ophthalmic solution Commonly known as:  XALATAN Place 1 drop into both eyes at bedtime.   metFORMIN 500 MG tablet Commonly known as:  GLUCOPHAGE Take 500 mg by mouth 2 (two) times daily with a meal.   zinc oxide 11.3 % Crea cream Commonly known as:  BALMEX Apply 1 application topically 2 (two) times daily.      Follow-up Information    [name], MD Follow up in 1 week(s).   Specialty:  Family Medicine Contact information: [place] [address] [phone]          Allergies  Allergen Reactions  . Feraheme [Ferumoxytol] Nausea And Vomiting    Felt like he was going to pass out.    Consultations: GI  Procedures/Studies: No results found.    Subjective: Feeling well, had BM, no further blood in stool  Discharge Exam: Vitals:   04/22/17 0610 04/22/17 1300  BP: 118/78 120/70  Pulse: 84 77  Resp: 16 18  Temp: 98.3 F (36.8 C) (!) 97.5 F (36.4 C)  SpO2: 100% 100%   Vitals:   04/21/17 1607 04/21/17 2139 04/22/17 0610 04/22/17 1300  BP: (!) 100/51 (!) 107/53 118/78 120/70  Pulse:  63 84 77  Resp:  _0 Temp:  98.3 F (36.8 C) 98.3 F (36.8 C) (!) 97.5 F (36.4 C)  TempSrc:  Oral Oral Oral  SpO2:  100% 100% 100%  Weight:   109.7 kg (241 lb 13.5 oz)     General: Pt is alert, awake, not in acute distress Cardiovascular: RRR, S1/S2 +, no rubs, no gallops Respiratory: CTA bilaterally, no wheezing, no rhonchi Abdominal: Soft, NT, ND, bowel sounds + Extremities: no edema, no cyanosis    The results of significant diagnostics from this hospitalization (including imaging, microbiology, ancillary and laboratory) are listed below for reference.     Microbiology: Recent Results (from the past 240 hour(s))  Urine Culture     Status: Abnormal   Collection Time: 04/21/17  9:04 AM  Result Value Ref Range Status   Specimen Description URINE, SUPRAPUBIC  Final   Special Requests NONE   Final   Culture 60,000 COLONIES/mL PROTEUS MIRABILIS (A)  Final   Report Status 04/23/2017 FINAL  Final   Organism ID, Bacteria PROTEUS MIRABILIS (A)  Final      Susceptibility   Proteus mirabilis - MIC*    AMPICILLIN >=32 RESISTANT Resistant     CEFAZOLIN <=4 SENSITIVE Sensitive     CEFTRIAXONE <=1 SENSITIVE Sensitive     CIPROFLOXACIN >=4 RESISTANT Resistant     GENTAMICIN <=1 SENSITIVE Sensitive     IMIPENEM 2 SENSITIVE Sensitive     NITROFURANTOIN 128 RESISTANT Resistant     TRIMETH/SULFA >=320 RESISTANT Resistant     AMPICILLIN/SULBACTAM <=2 SENSITIVE Sensitive     PIP/TAZO <=4 SENSITIVE Sensitive     *  60,000 COLONIES/mL PROTEUS MIRABILIS     Labs: BNP (last 3 results) No results for input(s): BNP in the last 8760 hours. Basic Metabolic Panel:  Recent Labs Lab 04/19/17 0241 04/20/17 0550 04/21/17 0607  NA 140 141 144  K 4.4 4.0 3.6  CL 108 112* 113*  CO2 _0 GLUCOSE 155* 124* 90  BUN _1 CREATININE 0.84 0.69 0.66  CALCIUM 8.5* 8.3* 8.3*   Liver Function Tests: No results for input(s): AST, ALT, ALKPHOS, BILITOT, PROT, ALBUMIN in the last 168 hours. No results for input(s): LIPASE, AMYLASE in the last 168 hours. No results for input(s): AMMONIA in the last 168 hours. CBC:  Recent Labs Lab 04/18/17 2231 04/19/17 0241  04/20/17 0550 04/20/17 1407 04/20/17 1937 04/21/17 0607 04/21/17 1634 04/22/17 0656  WBC 12.8* 10.8*  --  13.1*  --   --  12.7*  --  12.2*  HGB 12.3* 10.0*  < > 9.5* 8.7* 9.1* 8.6* 8.3* 8.5*  HCT 34.8* 29.0*  < > 26.7* 24.7* 25.9* 24.4* 23.3* 24.8*  MCV 72.2* 72.9*  --  74.6*  --   --  76.5*  --  77.3*  PLT 355 285  --  225  --   --  230  --  263  < > = values in this interval not displayed. Cardiac Enzymes: No results for input(s): CKTOTAL, CKMB, CKMBINDEX, TROPONINI in the last 168 hours. BNP: Invalid input(s): POCBNP CBG:  Recent Labs Lab 04/21/17 1204 04/21/17 1707 04/21/17 2137 04/22/17 0751 04/22/17 1217   GLUCAP 103* 100* 131* 90 101*   D-Dimer No results for input(s): DDIMER in the last 72 hours. Hgb A1c No results for input(s): HGBA1C in the last 72 hours. Lipid Profile No results for input(s): CHOL, HDL, LDLCALC, TRIG, CHOLHDL, LDLDIRECT in the last 72 hours. Thyroid function studies No results for input(s): TSH, T4TOTAL, T3FREE, THYROIDAB in the last 72 hours.  Invalid input(s): FREET3 Anemia work up No results for input(s): VITAMINB12, FOLATE, FERRITIN, TIBC, IRON, RETICCTPCT in the last 72 hours. Urinalysis    Component Value Date/Time   COLORURINE STRAW (A) 04/21/2017 0904   APPEARANCEUR CLEAR 04/21/2017 0904   LABSPEC 1.006 04/21/2017 0904   PHURINE 5.0 04/21/2017 0904   GLUCOSEU NEGATIVE 04/21/2017 0904   HGBUR NEGATIVE 04/21/2017 0904   HGBUR small 07/08/2010 0831   BILIRUBINUR NEGATIVE 04/21/2017 0904   KETONESUR NEGATIVE 04/21/2017 0904   PROTEINUR NEGATIVE 04/21/2017 0904   UROBILINOGEN 0.2 07/21/2014 1123   NITRITE NEGATIVE 04/21/2017 0904   LEUKOCYTESUR LARGE (A) 04/21/2017 0904   Sepsis Labs Invalid input(s): PROCALCITONIN,  WBC,  LACTICIDVEN Microbiology Recent Results (from the past 240 hour(s))  Urine Culture     Status: Abnormal   Collection Time: 04/21/17  9:04 AM  Result Value Ref Range Status   Specimen Description URINE, SUPRAPUBIC  Final   Special Requests NONE  Final   Culture 60,000 COLONIES/mL PROTEUS MIRABILIS (A)  Final   Report Status 04/23/2017 FINAL  Final   Organism ID, Bacteria PROTEUS MIRABILIS (A)  Final      Susceptibility   Proteus mirabilis - MIC*    AMPICILLIN >=32 RESISTANT Resistant     CEFAZOLIN <=4 SENSITIVE Sensitive     CEFTRIAXONE <=1 SENSITIVE Sensitive     CIPROFLOXACIN >=4 RESISTANT Resistant     GENTAMICIN <=1 SENSITIVE Sensitive     IMIPENEM 2 SENSITIVE Sensitive     NITROFURANTOIN 128 RESISTANT Resistant     TRIMETH/SULFA >=320 RESISTANT  Resistant     AMPICILLIN/SULBACTAM <=2 SENSITIVE Sensitive     PIP/TAZO  <=4 SENSITIVE Sensitive     * 60,000 COLONIES/mL PROTEUS MIRABILIS     Time coordinating discharge: Over 30 minutes  SIGNED:   Elmarie Shiley, MD  [facility] 04/25/2017, 9:38 PM Pager   If 7PM-7AM, please contact night-coverage [email or web address] Password [email or web address]

## 2017-04-22 NOTE — Discharge Instructions (Signed)
Ok to resume Xarelto on 10-04 if no further bleeding.

## 2017-04-22 NOTE — Progress Notes (Signed)
This shift pt still concerned about not having BM.  Abdomen assessed, soft, nontender and pt is passing gas. Pt states his BM'S are regular. Reminded pt this is now day 2 of eating. hospitalist notified of pt's concerns. No new orders at this time, will continue to monitor.

## 2017-04-22 NOTE — Progress Notes (Signed)
Pt leaving this afternoon with [facility]. Pt and family aware of discharge home. Discharge instructions/prescriptions given/explained with pt verbalizing understanding.  Pt states he has wheelchair and equipment needed at home.

## 2017-04-23 LAB — URINE CULTURE: Culture: 60000 — AB

## 2018-03-07 ENCOUNTER — Inpatient Hospital Stay: Payer: Medicare (Managed Care) | Attending: Hematology and Oncology

## 2018-03-07 DIAGNOSIS — C9001 Multiple myeloma in remission: Secondary | ICD-10-CM | POA: Insufficient documentation

## 2018-03-14 ENCOUNTER — Telehealth: Payer: Self-pay | Admitting: Hematology and Oncology

## 2018-03-14 ENCOUNTER — Other Ambulatory Visit: Payer: Self-pay | Admitting: Hematology and Oncology

## 2018-03-14 ENCOUNTER — Ambulatory Visit: Payer: Medicare (Managed Care) | Admitting: Hematology and Oncology

## 2018-03-14 ENCOUNTER — Inpatient Hospital Stay: Payer: Medicare (Managed Care)

## 2018-03-14 DIAGNOSIS — C9001 Multiple myeloma in remission: Secondary | ICD-10-CM

## 2018-03-14 LAB — COMPREHENSIVE METABOLIC PANEL
ALT: 26 U/L (ref 0–44)
AST: 21 U/L (ref 15–41)
Albumin: 3.2 g/dL — ABNORMAL LOW (ref 3.5–5.0)
Alkaline Phosphatase: 83 U/L (ref 38–126)
Anion gap: 7 (ref 5–15)
BUN: 10 mg/dL (ref 8–23)
CO2: 28 mmol/L (ref 22–32)
Calcium: 9.6 mg/dL (ref 8.9–10.3)
Chloride: 108 mmol/L (ref 98–111)
Creatinine, Ser: 0.78 mg/dL (ref 0.61–1.24)
GFR calc Af Amer: >60 mL/min (ref >60)
GFR calc non Af Amer: >60 mL/min (ref >60)
Glucose, Bld: 96 mg/dL (ref 70–99)
Potassium: 3.8 mmol/L (ref 3.5–5.1)
Sodium: 143 mmol/L (ref 135–145)
Total Bilirubin: 0.4 mg/dL (ref 0.3–1.2)
Total Protein: 7.6 g/dL (ref 6.5–8.1)

## 2018-03-14 LAB — CBC WITH DIFFERENTIAL/PLATELET
Basophils Absolute: 0 10*3/uL (ref 0.0–0.1)
Basophils Relative: 0 %
Eosinophils Absolute: 0.7 10*3/uL — ABNORMAL HIGH (ref 0.0–0.5)
Eosinophils Relative: 9 %
HCT: 35.7 % — ABNORMAL LOW (ref 38.4–49.9)
Hemoglobin: 12.2 g/dL — ABNORMAL LOW (ref 13.0–17.1)
Lymphocytes Relative: 24 %
Lymphs Abs: 1.9 10*3/uL (ref 0.9–3.3)
MCH: 24.9 pg — ABNORMAL LOW (ref 27.2–33.4)
MCHC: 34.2 g/dL (ref 32.0–36.0)
MCV: 73 fL — ABNORMAL LOW (ref 79.3–98.0)
Monocytes Absolute: 0.5 10*3/uL (ref 0.1–0.9)
Monocytes Relative: 7 %
Neutro Abs: 4.6 10*3/uL (ref 1.5–6.5)
Neutrophils Relative %: 60 %
Platelets: 360 10*3/uL (ref 140–400)
RBC: 4.89 MIL/uL (ref 4.20–5.82)
RDW: 18.1 % — ABNORMAL HIGH (ref 11.0–14.6)
WBC: 7.7 10*3/uL (ref 4.0–10.3)

## 2018-03-14 LAB — FERRITIN: Ferritin: 92 ng/mL (ref 24–336)

## 2018-03-14 NOTE — Telephone Encounter (Signed)
Rescheduled appt w/ NG as she needed lab at least one week before appt w/ her.   Gave patient calendar.

## 2018-03-15 LAB — KAPPA/LAMBDA LIGHT CHAINS
Kappa free light chain: 26.9 mg/L — ABNORMAL HIGH (ref 3.3–19.4)
Kappa, lambda light chain ratio: 1.23 (ref 0.26–1.65)
Lambda free light chains: 21.8 mg/L (ref 5.7–26.3)

## 2018-03-16 LAB — MULTIPLE MYELOMA PANEL, SERUM
Albumin SerPl Elph-Mcnc: 3.2 g/dL (ref 2.9–4.4)
Albumin/Glob SerPl: 0.9 (ref 0.7–1.7)
Alpha 1: 0.3 g/dL (ref 0.0–0.4)
Alpha2 Glob SerPl Elph-Mcnc: 0.9 g/dL (ref 0.4–1.0)
B-Globulin SerPl Elph-Mcnc: 1.1 g/dL (ref 0.7–1.3)
Gamma Glob SerPl Elph-Mcnc: 1.4 g/dL (ref 0.4–1.8)
Globulin, Total: 3.7 g/dL (ref 2.2–3.9)
IgA: 183 mg/dL (ref 61–437)
IgG (Immunoglobin G), Serum: 1523 mg/dL (ref 700–1600)
IgM (Immunoglobulin M), Srm: 59 mg/dL (ref 20–172)
M Protein SerPl Elph-Mcnc: 0.7 g/dL — ABNORMAL HIGH
Total Protein ELP: 6.9 g/dL (ref 6.0–8.5)

## 2018-03-26 ENCOUNTER — Telehealth: Payer: Self-pay | Admitting: Hematology and Oncology

## 2018-03-26 ENCOUNTER — Inpatient Hospital Stay: Payer: Medicare (Managed Care) | Attending: Hematology and Oncology | Admitting: Hematology and Oncology

## 2018-03-26 VITALS — BP 126/72 | HR 78 | Temp 97.9°F | Resp 18

## 2018-03-26 DIAGNOSIS — D500 Iron deficiency anemia secondary to blood loss (chronic): Secondary | ICD-10-CM

## 2018-03-26 DIAGNOSIS — D509 Iron deficiency anemia, unspecified: Secondary | ICD-10-CM

## 2018-03-26 DIAGNOSIS — C9001 Multiple myeloma in remission: Secondary | ICD-10-CM | POA: Diagnosis not present

## 2018-03-26 DIAGNOSIS — Z7901 Long term (current) use of anticoagulants: Secondary | ICD-10-CM | POA: Insufficient documentation

## 2018-03-26 DIAGNOSIS — Z86718 Personal history of other venous thrombosis and embolism: Secondary | ICD-10-CM | POA: Insufficient documentation

## 2018-03-26 MED ORDER — RIVAROXABAN 10 MG PO TABS: 10.0000 mg | ORAL_TABLET | Freq: Every day | ORAL | 9 refills | Status: DC

## 2018-03-26 MED ORDER — RIVAROXABAN 10 MG PO TABS: 10.0000 mg | ORAL_TABLET | Freq: Every day | ORAL | 9 refills | Status: AC

## 2018-03-26 NOTE — Telephone Encounter (Signed)
Gave patient avs and calendars.

## 2018-03-27 ENCOUNTER — Encounter: Payer: Self-pay | Admitting: Hematology and Oncology

## 2018-03-27 NOTE — Progress Notes (Signed)
[place] OFFICE PROGRESS NOTE  Patient Care Team: [name], MD as PCP - General (Family Medicine) [name], MD (Inactive) as Consulting Physician (Hematology and Oncology) [name], MD as Consulting Physician (Physical Medicine and Rehabilitation) Donia Guiles Lavon Paganini, PA-C as Physician Assistant (Physical Medicine and Rehabilitation) [name], MD as Consulting Physician (Hematology and Oncology)  ASSESSMENT & PLAN:  Multiple myeloma in remission [facility]) From the multiple myeloma standpoint, he has detectable M protein but overall asymptomatic I recommend repeat blood work again in 6 months for further follow-up In the meantime, he will continue calcium with vitamin D supplement  Iron deficiency anemia He has chronic intermittent iron deficiency anemia due to chronic bleeding He is taking chronic iron supplement indefinitely.  History of DVT (deep vein thrombosis) He had placement of IVC filter remain on Xarelto. Due to poor mobility, and lack of bleeding complications, I recommend he remain on Xarelto indefinitely. However, that has to be balanced with the risk of bleeding. We discussed the role of extended duration Xarelto for secondary prevention against the risk of recurrence of DVT  The recommendation is based on the published study as discussed briefly with the patient today:  Rivaroxaban or Aspirin for Extended Treatment of Venous Thromboembolism J.I. [place], A.W.A. Lensing, M.H. Prins, R. Bauersachs, J. Beyer-Westendorf, H. Bounameaux, T.A. Brighton, A.T. Beecher Mcardle. Monia Pouch, H. Decousus, M.C.S. Harold Barban, Darnell Level. Holberg, [place], L. Tamala Julian, A.F. Pap, S.D. Thereasa Parkin, P.S. Rock Nephew, and Lazaro Arms, for the [employer] Investigators*  This article was published on October 09, 2015, at [email or web address] N Engl J Med 2017;376:1211-22. DOI: 10.1056/NEJMoa1700518 Copyright  2017 [place]  ABSTRACT  BACKGROUND  Although many patients with venous thromboembolism require extended treatment, it is uncertain whether it is better to use full- or lower-intensity anticoagulation therapy or aspirin. METHODS In this randomized, double-blind, phase 3 study, we assigned 3396 patients with venous thromboembolism to receive either once-daily rivaroxaban (at doses of 20 mg or 10 mg) or 100 mg of aspirin. All the study patients had completed 6 to 12 months of anticoagulation therapy and were in equipoise regarding the need for continued anticoagulation. Study drugs were administered for up to 12 months. The primary efficacy outcome was symptomatic recurrent fatal or nonfatal venous thromboembolism, and the principal safety outcome was major bleeding. RESULTS A total of 3365 patients were included in the intention-to-treat analyses (median treatment duration, 351 days). The primary efficacy outcome occurred in 17 of 1107 patients (1.5%) receiving 20 mg of rivaroxaban and in 13 of 1127 patients (1.2%) receiving 10 mg of rivaroxaban, as compared with 50 of 1131 patients (4.4%) receiving aspirin (hazard ratio for 20 mg of rivaroxaban vs. aspirin, 0.34; 95% confidence interval [CI], 0.20 to 0.59; hazard ratio for 10 mg of rivaroxaban vs. Aspirin, 0.26; 95% CI, 0.14 to 0.47; P<0.001 for both comparisons). Rates of major bleeding were 0.5% in the group receiving 20 mg of rivaroxaban, 0.4% in the group receiving 10 mg of rivaroxaban, and 0.3% in the aspirin group; the rates of clinically relevant nonmajor bleeding were 2.7%, 2.0%, and 1.8%, respectively. The incidence of adverse events was similar in all three groups. CONCLUSIONS Among patients with venous thromboembolism in equipoise for continued anticoagulation, the risk of a recurrent event was significantly lower with rivaroxaban at either a treatment dose (20 mg) or a prophylactic dose (10 mg) than with aspirin, without a significant increase in  bleeding rates.  (Funded by [employer]; [employer]  CHOICE ClinicalTrials.gov number, [id number].)  Duration of treatment is life-long I recommend reduced dose Xarelto that would minimize the risk of bleeding but yet he will continue to derive benefit If treatment interruption is needed for elective surgery, the patient will contact me for perioperative anticoagulation management I refilled his prescription with reduced dose Xarelto today     Orders Placed This Encounter  Procedures  . Comprehensive metabolic panel    Standing Status:   Future    Standing Expiration Date:   05/01/2019  . CBC with Differential/Platelet    Standing Status:   Future    Standing Expiration Date:   05/01/2019  . Ferritin    Standing Status:   Future    Standing Expiration Date:   03/27/2019  . Iron and TIBC    Standing Status:   Future    Standing Expiration Date:   05/01/2019  . Kappa/lambda light chains    Standing Status:   Future    Standing Expiration Date:   05/01/2019  . Multiple Myeloma Panel (SPEP&IFE w/QIG)    Standing Status:   Future    Standing Expiration Date:   05/01/2019  . Beta 2 microglobulin, serum    Standing Status:   Future    Standing Expiration Date:   05/01/2019    INTERVAL HISTORY: Please see below for problem oriented charting. He returns for further follow-up He denies frank overt bleeding from his catheter or other sites He denies new bone pain or infection.  SUMMARY OF ONCOLOGIC HISTORY:  1. 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 The patient underwent biopsy and cord decompression by Dr. Dierdre Harness on 07/15/2010. He underwent radiation treatments which concluded by 08/22/2010. The patient received treatment with Revlimid, Decadron, and Velcade in combination with Zometa. His initial IgG levels were as high as 5570 back in February 2012.  Initial 24-hour urine protein came to 318 mg, positive for IgG heavy chains and lambda light chains. I do not think the patient ever had a bone marrow or a metastatic bone survey; however, CT scans of abdomen and pelvis carried out on 12/09/2010 have shown myelomatous involvement of the bones. A CT scan of the head on 05/05/2011 was normal. The patient has had an  outstanding response to treatment. IgG level from 09/11/2012 was 904. Serum immunofixation electrophoresis continues to show monoclonal IgG lambda protein. It appears that Velcade was discontinued in September 2012 and that Revlimid and Decadron were discontinued in March 2013. 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 The patient is presently on no specific treatment for his IgG lambda multiple myeloma.  2. Paraplegia which developed in December 2011. The patient is status post surgical decompression of the T8 paraspinal mass by Dr. Erline Levine. He is also status post radiation treatments. The patient had chronic neurogenic bladder and bowel. He has placement of suprapubic catheter. 3. 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 He had an inferior vena cava filter had been placed on November 10, 2010 and he has been on Xarelto since 06/12/2012.  4. 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  5. He also has adrenal insufficiency on chronic prednisone therapy complicated by diabetes mellitus.  6.He has chronic anemia noted since 2014 treated  with IV Feraheme and had infusion reactions.  7. 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 Isac Caddy.  REVIEW OF SYSTEMS:   Constitutional: Denies fevers, chills or abnormal weight loss Eyes: Denies  blurriness of vision Ears, nose, mouth, throat, and face: Denies mucositis or sore throat Respiratory: Denies cough, dyspnea or wheezes Cardiovascular: Denies palpitation, chest discomfort or lower extremity swelling Gastrointestinal:  Denies nausea, heartburn or change in bowel habits Skin: Denies abnormal skin rashes Lymphatics: Denies new lymphadenopathy or easy bruising Neurological:Denies numbness, tingling or new weaknesses Behavioral/Psych: Mood is stable, no new changes  All other systems were reviewed with the patient and are negative.  I have reviewed the past medical history, past surgical history, social history and family history with the patient and they are unchanged from previous note.  ALLERGIES:  is allergic to feraheme [ferumoxytol].  MEDICATIONS:  Current Outpatient Medications  Medication Sig Dispense Refill  . baclofen (LIORESAL) 20 MG tablet Take 20 mg by mouth 2 (two) times daily.    . brinzolamide (AZOPT) 1 % ophthalmic suspension Place 1 drop into both eyes 2 (two) times daily.    . iron polysaccharides (NU-IRON) 150 MG capsule Take 1 capsule (150 mg total) by mouth daily. 30 capsule 0  . latanoprost (XALATAN) 0.005 % ophthalmic solution Place 1 drop into both eyes at bedtime.    . metFORMIN (GLUCOPHAGE) 500 MG tablet Take 500 mg by mouth 2 (two) times daily with a meal.    . rivaroxaban (XARELTO) 10 MG TABS tablet Take 1 tablet (10 mg total) by mouth daily with supper. 30 tablet 9  . Skin Protectants, Misc. (EUCERIN) cream Apply 1 application topically 2 (two) times daily as needed for dry skin.     Marland Kitchen zinc oxide (BALMEX) 11.3 % CREA cream Apply 1 application topically 2 (two) times daily.     No current facility-administered medications for this visit.     PHYSICAL EXAMINATION: ECOG PERFORMANCE STATUS: 2 - Symptomatic, <50% confined to bed  Vitals:   03/26/18 1227  BP: 126/72  Pulse: 78  Resp: 18  Temp: 97.9 F (36.6 C)  SpO2: 100%   There were no  vitals filed for this visit.  GENERAL:alert, no distress and comfortable NEURO: alert & oriented x 3 with fluent speech LABORATORY DATA:  I have reviewed the data as listed    Component Value Date/Time   NA 143 03/14/2018 1000   NA 141 03/01/2017 1133   K 3.8 03/14/2018 1000   K 4.0 03/01/2017 1133   CL 108 03/14/2018 1000   CL 104 11/15/2012 0823   CO2 28 03/14/2018 1000   CO2 26 03/01/2017 1133   GLUCOSE 96 03/14/2018 1000   GLUCOSE 89 03/01/2017 1133   GLUCOSE 141 (H) 11/15/2012 0823   BUN 10 03/14/2018 1000   BUN 13.7 03/01/2017 1133   CREATININE 0.78 03/14/2018 1000   CREATININE 0.8 03/01/2017 1133   CALCIUM 9.6 03/14/2018 1000   CALCIUM 10.1 03/01/2017 1133   PROT 7.6 03/14/2018 1000   PROT 7.7 03/01/2017 1133   PROT 8.1 03/01/2017 1133   ALBUMIN 3.2 (L) 03/14/2018 1000   ALBUMIN 3.7 03/01/2017 1133   AST 21 03/14/2018 1000   AST 28 03/01/2017 1133   ALT 26 03/14/2018 1000   ALT 35 03/01/2017 1133   ALKPHOS 83 03/14/2018 1000   ALKPHOS 91 03/01/2017 1133   BILITOT 0.4 03/14/2018 1000  BILITOT 0.60 03/01/2017 1133   GFRNONAA >60 03/14/2018 1000   GFRAA >60 03/14/2018 1000    No results found for: SPEP, UPEP  Lab Results  Component Value Date   WBC 7.7 03/14/2018   NEUTROABS 4.6 03/14/2018   HGB 12.2 (L) 03/14/2018   HCT 35.7 (L) 03/14/2018   MCV 73.0 (L) 03/14/2018   PLT 360 03/14/2018      Chemistry      Component Value Date/Time   NA 143 03/14/2018 1000   NA 141 03/01/2017 1133   K 3.8 03/14/2018 1000   K 4.0 03/01/2017 1133   CL 108 03/14/2018 1000   CL 104 11/15/2012 0823   CO2 28 03/14/2018 1000   CO2 26 03/01/2017 1133   BUN 10 03/14/2018 1000   BUN 13.7 03/01/2017 1133   CREATININE 0.78 03/14/2018 1000   CREATININE 0.8 03/01/2017 1133   GLU 166 (H) 09/19/2010 1538      Component Value Date/Time   CALCIUM 9.6 03/14/2018 1000   CALCIUM 10.1 03/01/2017 1133   ALKPHOS 83 03/14/2018 1000   ALKPHOS 91 03/01/2017 1133   AST 21  03/14/2018 1000   AST 28 03/01/2017 1133   ALT 26 03/14/2018 1000   ALT 35 03/01/2017 1133   BILITOT 0.4 03/14/2018 1000   BILITOT 0.60 03/01/2017 1133       All questions were answered. The patient knows to call the clinic with any problems, questions or concerns. No barriers to learning was detected.  I spent 15 minutes counseling the patient face to face. The total time spent in the appointment was 20 minutes and more than 50% was on counseling and review of test results  [name], MD 03/27/2018 7:19 AM

## 2018-03-27 NOTE — Assessment & Plan Note (Signed)
He had placement of IVC filter remain on Xarelto. Due to poor mobility, and lack of bleeding complications, I recommend he remain on Xarelto indefinitely. However, that has to be balanced with the risk of bleeding. We discussed the role of extended duration Xarelto for secondary prevention against the risk of recurrence of DVT  The recommendation is based on the published study as discussed briefly with the patient today:  Rivaroxaban or Aspirin for Extended Treatment of Venous Thromboembolism J.I. [place], A.W.A. Lensing, M.H. Prins, R. Bauersachs, J. Beyer-Westendorf, H. Bounameaux, T.A. Brighton, A.T. Beecher Mcardle. Monia Pouch, H. Decousus, M.C.S. Harold Barban, Darnell Level. Holberg, [place], L. Tamala Julian, A.F. Pap, S.D. Thereasa Parkin, P.S. Rock Nephew, and Lazaro Arms, for the [employer] CHO[employer] Investigators*  This article was published on October 09, 2015, at [email or web address] N Engl J Med 2017;376:1211-22. DOI: 10.1056/[id number] Copyright  2017 [place]  ABSTRACT  BACKGROUND  Although many patients with venous thromboembolism require extended treatment, it is uncertain whether it is better to use full- or lower-intensity anticoagulation therapy or aspirin. METHODS In this randomized, double-blind, phase 3 study, we assigned 3396 patients with venous thromboembolism to receive either once-daily rivaroxaban (at doses of 20 mg or 10 mg) or 100 mg of aspirin. All the study patients had completed 6 to 12 months of anticoagulation therapy and were in equipoise regarding the need for continued anticoagulation. Study drugs were administered for up to 12 months. The primary efficacy outcome was symptomatic recurrent fatal or nonfatal venous thromboembolism, and the principal safety outcome was major bleeding. RESULTS A total of 3365 patients were included in the intention-to-treat analyses (median treatment duration, 351 days). The primary efficacy outcome occurred in 17 of 1107  patients (1.5%) receiving 20 mg of rivaroxaban and in 13 of 1127 patients (1.2%) receiving 10 mg of rivaroxaban, as compared with 50 of 1131 patients (4.4%) receiving aspirin (hazard ratio for 20 mg of rivaroxaban vs. aspirin, 0.34; 95% confidence interval [CI], 0.20 to 0.59; hazard ratio for 10 mg of rivaroxaban vs. Aspirin, 0.26; 95% CI, 0.14 to 0.47; P<0.001 for both comparisons). Rates of major bleeding were 0.5% in the group receiving 20 mg of rivaroxaban, 0.4% in the group receiving 10 mg of rivaroxaban, and 0.3% in the aspirin group; the rates of clinically relevant nonmajor bleeding were 2.7%, 2.0%, and 1.8%, respectively. The incidence of adverse events was similar in all three groups. CONCLUSIONS Among patients with venous thromboembolism in equipoise for continued anticoagulation, the risk of a recurrent event was significantly lower with rivaroxaban at either a treatment dose (20 mg) or a prophylactic dose (10 mg) than with aspirin, without a significant increase in bleeding rates.  (Funded by [employer]; [place].gov number, [id number].)  Duration of treatment is life-long I recommend reduced dose Xarelto that would minimize the risk of bleeding but yet he will continue to derive benefit If treatment interruption is needed for elective surgery, the patient will contact me for perioperative anticoagulation management I refilled his prescription with reduced dose Xarelto today

## 2018-03-27 NOTE — Assessment & Plan Note (Signed)
From the multiple myeloma standpoint, he has detectable M protein but overall asymptomatic I recommend repeat blood work again in 6 months for further follow-up In the meantime, he will continue calcium with vitamin D supplement

## 2018-03-27 NOTE — Assessment & Plan Note (Signed)
He has chronic intermittent iron deficiency anemia due to chronic bleeding He is taking chronic iron supplement indefinitely.

## 2018-07-29 ENCOUNTER — Other Ambulatory Visit: Payer: Self-pay

## 2018-07-29 ENCOUNTER — Encounter (HOSPITAL_COMMUNITY): Payer: Self-pay

## 2018-07-29 ENCOUNTER — Emergency Department (HOSPITAL_COMMUNITY)
Admission: EM | Admit: 2018-07-29 | Discharge: 2018-07-29 | Disposition: A | Discharge status: Home / Self Care | Payer: Medicare (Managed Care) | Attending: Emergency Medicine | Admitting: Emergency Medicine

## 2018-07-29 DIAGNOSIS — Y69 Unspecified misadventure during surgical and medical care: Secondary | ICD-10-CM | POA: Insufficient documentation

## 2018-07-29 DIAGNOSIS — T83020A Displacement of cystostomy catheter, initial encounter: Secondary | ICD-10-CM | POA: Diagnosis present

## 2018-07-29 DIAGNOSIS — Z79899 Other long term (current) drug therapy: Secondary | ICD-10-CM | POA: Diagnosis not present

## 2018-07-29 DIAGNOSIS — G8220 Paraplegia, unspecified: Secondary | ICD-10-CM | POA: Insufficient documentation

## 2018-07-29 DIAGNOSIS — E119 Type 2 diabetes mellitus without complications: Secondary | ICD-10-CM | POA: Diagnosis not present

## 2018-07-29 DIAGNOSIS — Z859 Personal history of malignant neoplasm, unspecified: Secondary | ICD-10-CM | POA: Insufficient documentation

## 2018-07-29 DIAGNOSIS — Z7984 Long term (current) use of oral hypoglycemic drugs: Secondary | ICD-10-CM | POA: Insufficient documentation

## 2018-07-29 DIAGNOSIS — T83010A Breakdown (mechanical) of cystostomy catheter, initial encounter: Secondary | ICD-10-CM

## 2018-07-29 DIAGNOSIS — Z7901 Long term (current) use of anticoagulants: Secondary | ICD-10-CM | POA: Diagnosis not present

## 2018-07-29 NOTE — ED Triage Notes (Signed)
Per EMS, patient coming from home with complaints of a dislodged suprapubic catheter. Patient noticed that he was wet all of a sudden and realized his catheter had come out. Tried to reinsert it himself but was unsuccessful.

## 2018-07-29 NOTE — ED Notes (Signed)
[facility] contacted for transport 

## 2018-07-29 NOTE — ED Notes (Signed)
Bed: WA20 Expected date:  Expected time:  Means of arrival:  Comments: 40M pulled out cath

## 2018-07-29 NOTE — ED Provider Notes (Signed)
[facility] DEPT Provider Note   CSN: [id number] Arrival date & time: 07/29/18  0440     History   Chief Complaint Chief Complaint  Patient presents with  . Suprapubic Catheter Problem    HPI [name] is a 63 y.o. male.  Patient presents to the emergency department with a chief complaint of suprapubic catheter displacement.  He states that he noticed that his catheter came out sometime tonight.  He reports trying to reinsert himself, but was unsuccessful.  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  He denies any pain.  Denies any other associated symptoms.  States that he just needs to have the catheter reinserted.  The history is provided by the patient. No language interpreter was used.    Past Medical History:  Diagnosis Date  . Adrenal insufficiency (HCC)    on chronic dexamethasone  . Anemia   . Cancer ([place])   . Coagulopathy ([place])    on xeralto/ s/p DVT while on coumadin,  IVC in place  . Diabetes mellitus without complication ([place])    type 2  . Gross hematuria 7/14   post foley cath procedure  . History of blood transfusion 7/14  . Multiple myeloma    thoracic T8 with paraplegia s/p resection- on chemo at visit 10/13/10  . Multiple myeloma   . Multiple myeloma without mention of remission   . Neurogenic bladder   . Neurogenic bowel   . Paraplegia ([place])   . Partial small bowel obstruction ([place]) during dec 2011 admission    Patient Active Problem List   Diagnosis Date Noted  . On rivaroxaban therapy   . Status post colonoscopy with polypectomy   . Lower GI bleeding 04/18/2017  . Acute blood loss anemia 04/18/2017  . Diabetes mellitus type 2 in obese ([place]) 04/18/2017  . Colon cancer screening   . Benign neoplasm of ascending colon   . History of DVT (deep vein thrombosis) 06/10/2015  . Obstructed suprapubic catheter ([place]) 07/21/2014  . Bilateral  hydronephrosis 07/21/2014  . Acute kidney failure ([place]) 07/20/2014  . Abdominal pain 07/20/2014  . AKI (acute kidney injury) ([place]) 07/20/2014  . Hematuria 02/12/2013  . Shock ([place]) 02/12/2013  . Adrenal insufficiency ([place]) 02/12/2013  . Hypoalbuminemia 11/15/2012  . Anemia, unspecified 09/18/2012  . Unspecified vitamin D deficiency 09/18/2012  . 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 Iron deficiency anemia 09/18/2012  . Diverticulitis of large intestine with perforation 07/26/2011  . Multiple myeloma in remission ([place]) 10/14/2010  . Neurogenic bowel 10/14/2010  . Paraplegia ([place]) 07/13/2010  . Neurogenic bladder 07/11/2010  . BACK PAIN, LUMBAR, WITH RADICULOPATHY 07/07/2010  . DISEQUILIBRIUM 07/07/2010  . Abdominal pain, generalized 07/07/2010  . OBESITY, NOS 09/20/2006    Past Surgical History:  Procedure Laterality Date  . COLONOSCOPY WITH PROPOFOL N/A 04/12/2017   Procedure: COLONOSCOPY WITH PROPOFOL;  Surgeon: Irene Shipper, MD;  Location: [facility];  Service: Endoscopy;  Laterality: N/A;  . COLONOSCOPY WITH PROPOFOL N/A 04/19/2017   Procedure: COLONOSCOPY WITH PROPOFOL;  Surgeon: Yetta Flock, MD;  Location: [facility];  Service: Gastroenterology;  Laterality: N/A;  . COLOSTOMY  07/20/2011   Procedure: COLOSTOMY;  Surgeon: Judieth Keens, DO;  Location: [facility];  Service: General;;  . COLOSTOMY REVISION  07/20/2011   Procedure: COLON RESECTION SIGMOID;  Surgeon: Judieth Keens, DO;  Location: [facility];  Service: General;;  .  CYSTOSCOPY N/A 04/04/2013   Procedure: CYSTOSCOPY WITH LITHALOPAXY;  Surgeon: Alexis Frock, MD;  Location: [facility];  Service: Urology;  Laterality: N/A;  . INSERTION OF SUPRAPUBIC CATHETER N/A 04/04/2013   Procedure: INSERTION OF SUPRAPUBIC CATHETER;  Surgeon: Alexis Frock, MD;  Location: [facility];  Service: Urology;  Laterality: N/A;  . LAPAROTOMY  07/20/2011   Procedure: EXPLORATORY LAPAROTOMY;  Surgeon:  Judieth Keens, DO;  Location: [facility];  Service: General;  Laterality: N/A;  . myeloma thoracic T8 with parpaplegia s/p thoracotomy and thoracic T7-9 cage placement on Dec 26th 2011  07/18/10        Home Medications    Prior to Admission medications   Medication Sig Start Date End Date Taking? Authorizing Provider  baclofen (LIORESAL) 20 MG tablet Take 20 mg by mouth 2 (two) times daily.    [provider]  brinzolamide (AZOPT) 1 % ophthalmic suspension Place 1 drop into both eyes 2 (two) times daily.    [provider]  iron polysaccharides (NU-IRON) 150 MG capsule Take 1 capsule (150 mg total) by mouth daily. 04/22/17   Regalado, Belkys A, MD  latanoprost (XALATAN) 0.005 % ophthalmic solution Place 1 drop into both eyes at bedtime.    [provider]  metFORMIN (GLUCOPHAGE) 500 MG tablet Take 500 mg by mouth 2 (two) times daily with a meal.    [provider]  rivaroxaban (XARELTO) 10 MG TABS tablet Take 1 tablet (10 mg total) by mouth daily with supper. 03/26/18   [name], MD  Skin Protectants, Misc. (EUCERIN) cream Apply 1 application topically 2 (two) times daily as needed for dry skin.     [provider]  zinc oxide (BALMEX) 11.3 % CREA cream Apply 1 application topically 2 (two) times daily.    [provider]    Family History Family History  Problem Relation Age of Onset  . Cancer Mother   . Ovarian cancer Mother   . Diabetes Father     Social History Social History   Tobacco Use  . Smoking status: Never Smoker  . Smokeless tobacco: Never Used  Substance Use Topics  . Alcohol use: No  . Drug use: No     Allergies   Feraheme [ferumoxytol]   Review of Systems Review of Systems  All other systems reviewed and are negative.    Physical Exam Updated Vital Signs BP 125/80 (BP Location: Left Arm)   Pulse 63   Resp 18   SpO2 99%   Physical Exam Vitals signs and nursing note reviewed.    Constitutional:      Appearance: He is well-developed.  HENT:     Head: Normocephalic and atraumatic.  Eyes:     Conjunctiva/sclera: Conjunctivae normal.  Neck:     Musculoskeletal: Normal range of motion.  Cardiovascular:     Rate and Rhythm: Normal rate.  Pulmonary:     Effort: Pulmonary effort is normal.  Abdominal:     General: There is no distension.     Comments: Colostomy in place  Musculoskeletal: Normal range of motion.  Skin:    General: Skin is dry.  Neurological:     Mental Status: He is alert and oriented to person, place, and time.  Psychiatric:        Behavior: Behavior normal.        Thought Content: Thought content normal.        Judgment: Judgment normal.      ED Treatments / Results  Labs (all labs ordered are listed, but only abnormal results are displayed) Labs Reviewed - No data to display  EKG None  Radiology No results found.  Procedures SUPRAPUBIC TUBE PLACEMENT Date/Time: 07/29/2018 5:39 AM Performed by: Montine Circle, PA-C Authorized by: Montine Circle, PA-C   Consent:    Consent obtained:  Verbal   Consent given by:  Patient   Risks discussed:  Pain   Alternatives discussed:  No treatment Procedure details:    Complexity:  Simple   Catheter type:  Foley   Catheter size:  18 Fr   Ultrasound guidance: no     Number of attempts:  1   Urine characteristics:  Yellow Post-procedure details:    Patient tolerance of procedure:  Tolerated well, no immediate complications   (including critical care time)  Medications Ordered in ED Medications - No data to display   Initial Impression / Assessment and Plan / ED Course  I have reviewed the triage vital signs and the nursing notes.  Pertinent labs & imaging results that were available during my care of the patient were reviewed by me and considered in my medical decision making (see chart for details).     Suprapubic catheter came out.  I replaced this in the emergency  department.  Sterile technique was used.  Catheter drained clear/yellow urine without complication.  Discharged home.  Final Clinical Impressions(s) / ED Diagnoses   Final diagnoses:  Suprapubic catheter dysfunction, initial encounter [facility])    ED Discharge Orders    None       Montine Circle, PA-C 07/29/18 0541    Rolland Porter, MD 07/29/18 [phone]

## 2018-08-21 ENCOUNTER — Ambulatory Visit
Admission: RE | Admit: 2018-08-21 | Discharge: 2018-08-21 | Disposition: A | Discharge status: Home / Self Care | Payer: Medicare (Managed Care) | Source: Ambulatory Visit | Attending: Nurse Practitioner | Admitting: Nurse Practitioner

## 2018-08-21 ENCOUNTER — Other Ambulatory Visit: Payer: Self-pay | Admitting: Nurse Practitioner

## 2018-08-21 DIAGNOSIS — R0989 Other specified symptoms and signs involving the circulatory and respiratory systems: Secondary | ICD-10-CM

## 2018-09-17 ENCOUNTER — Inpatient Hospital Stay: Payer: Medicare (Managed Care) | Attending: Hematology and Oncology

## 2018-09-17 DIAGNOSIS — C9001 Multiple myeloma in remission: Secondary | ICD-10-CM | POA: Diagnosis not present

## 2018-09-17 DIAGNOSIS — Z86718 Personal history of other venous thrombosis and embolism: Secondary | ICD-10-CM

## 2018-09-17 DIAGNOSIS — D500 Iron deficiency anemia secondary to blood loss (chronic): Secondary | ICD-10-CM

## 2018-09-17 LAB — FERRITIN: Ferritin: 273 ng/mL (ref 24–336)

## 2018-09-17 LAB — COMPREHENSIVE METABOLIC PANEL
ALT: 15 U/L (ref 0–44)
AST: 16 U/L (ref 15–41)
Albumin: 3.6 g/dL (ref 3.5–5.0)
Alkaline Phosphatase: 79 U/L (ref 38–126)
Anion gap: 10 (ref 5–15)
BUN: 9 mg/dL (ref 8–23)
CO2: 26 mmol/L (ref 22–32)
Calcium: 9.7 mg/dL (ref 8.9–10.3)
Chloride: 104 mmol/L (ref 98–111)
Creatinine, Ser: 0.82 mg/dL (ref 0.61–1.24)
GFR calc Af Amer: >60 mL/min (ref >60)
GFR calc non Af Amer: >60 mL/min (ref >60)
Glucose, Bld: 76 mg/dL (ref 70–99)
Potassium: 3.9 mmol/L (ref 3.5–5.1)
Sodium: 140 mmol/L (ref 135–145)
Total Bilirubin: 0.8 mg/dL (ref 0.3–1.2)
Total Protein: 8.7 g/dL — ABNORMAL HIGH (ref 6.5–8.1)

## 2018-09-17 LAB — CBC WITH DIFFERENTIAL/PLATELET
Abs Immature Granulocytes: 0.04 10*3/uL (ref 0.00–0.07)
Basophils Absolute: 0 10*3/uL (ref 0.0–0.1)
Basophils Relative: 0 %
Eosinophils Absolute: 0.4 10*3/uL (ref 0.0–0.5)
Eosinophils Relative: 3 %
HCT: 38.7 % — ABNORMAL LOW (ref 39.0–52.0)
Hemoglobin: 13.3 g/dL (ref 13.0–17.0)
Immature Granulocytes: 0 %
Lymphocytes Relative: 17 %
Lymphs Abs: 2 10*3/uL (ref 0.7–4.0)
MCH: 25.5 pg — ABNORMAL LOW (ref 26.0–34.0)
MCHC: 34.4 g/dL (ref 30.0–36.0)
MCV: 74.1 fL — ABNORMAL LOW (ref 80.0–100.0)
Monocytes Absolute: 0.9 10*3/uL (ref 0.1–1.0)
Monocytes Relative: 7 %
Neutro Abs: 8.5 10*3/uL — ABNORMAL HIGH (ref 1.7–7.7)
Neutrophils Relative %: 73 %
Platelets: 380 10*3/uL (ref 150–400)
RBC: 5.22 MIL/uL (ref 4.22–5.81)
RDW: 17.7 % — ABNORMAL HIGH (ref 11.5–15.5)
WBC: 11.8 10*3/uL — ABNORMAL HIGH (ref 4.0–10.5)
nRBC: 0 % (ref 0.0–0.2)

## 2018-09-17 LAB — IRON AND TIBC
Iron: 17 ug/dL — ABNORMAL LOW (ref 42–163)
Saturation Ratios: 7 % — ABNORMAL LOW (ref 20–55)
TIBC: 235 ug/dL (ref 202–409)
UIBC: 218 ug/dL (ref 117–376)

## 2018-09-18 LAB — BETA 2 MICROGLOBULIN, SERUM: Beta-2 Microglobulin: 2.1 mg/L (ref 0.6–2.4)

## 2018-09-18 LAB — KAPPA/LAMBDA LIGHT CHAINS
Kappa free light chain: 22.7 mg/L — ABNORMAL HIGH (ref 3.3–19.4)
Kappa, lambda light chain ratio: 0.54 (ref 0.26–1.65)
Lambda free light chains: 42 mg/L — ABNORMAL HIGH (ref 5.7–26.3)

## 2018-09-19 ENCOUNTER — Telehealth: Payer: Self-pay | Admitting: Hematology and Oncology

## 2018-09-19 NOTE — Telephone Encounter (Signed)
Called pt per 2/26 sch message to r/s - no answer from either phones left message.

## 2018-09-20 LAB — MULTIPLE MYELOMA PANEL, SERUM
Albumin SerPl Elph-Mcnc: 3.3 g/dL (ref 2.9–4.4)
Albumin/Glob SerPl: 0.8 (ref 0.7–1.7)
Alpha 1: 0.3 g/dL (ref 0.0–0.4)
Alpha2 Glob SerPl Elph-Mcnc: 1.1 g/dL — ABNORMAL HIGH (ref 0.4–1.0)
B-Globulin SerPl Elph-Mcnc: 1.1 g/dL (ref 0.7–1.3)
Gamma Glob SerPl Elph-Mcnc: 1.7 g/dL (ref 0.4–1.8)
Globulin, Total: 4.2 g/dL — ABNORMAL HIGH (ref 2.2–3.9)
IgA: 134 mg/dL (ref 61–437)
IgG (Immunoglobin G), Serum: 1795 mg/dL — ABNORMAL HIGH (ref 700–1600)
IgM (Immunoglobulin M), Srm: 45 mg/dL (ref 20–172)
M Protein SerPl Elph-Mcnc: 0.7 g/dL — ABNORMAL HIGH
Total Protein ELP: 7.5 g/dL (ref 6.0–8.5)

## 2018-09-24 ENCOUNTER — Telehealth: Payer: Self-pay | Admitting: Hematology and Oncology

## 2018-09-24 ENCOUNTER — Encounter: Payer: Self-pay | Admitting: Hematology and Oncology

## 2018-09-24 ENCOUNTER — Inpatient Hospital Stay: Payer: Medicare (Managed Care) | Attending: Hematology and Oncology | Admitting: Hematology and Oncology

## 2018-09-24 DIAGNOSIS — Z86718 Personal history of other venous thrombosis and embolism: Secondary | ICD-10-CM | POA: Diagnosis not present

## 2018-09-24 DIAGNOSIS — Z7952 Long term (current) use of systemic steroids: Secondary | ICD-10-CM | POA: Diagnosis not present

## 2018-09-24 DIAGNOSIS — C9001 Multiple myeloma in remission: Secondary | ICD-10-CM

## 2018-09-24 DIAGNOSIS — Z7984 Long term (current) use of oral hypoglycemic drugs: Secondary | ICD-10-CM | POA: Diagnosis not present

## 2018-09-24 DIAGNOSIS — E119 Type 2 diabetes mellitus without complications: Secondary | ICD-10-CM | POA: Diagnosis not present

## 2018-09-24 DIAGNOSIS — D500 Iron deficiency anemia secondary to blood loss (chronic): Secondary | ICD-10-CM

## 2018-09-24 DIAGNOSIS — Z7901 Long term (current) use of anticoagulants: Secondary | ICD-10-CM

## 2018-09-24 DIAGNOSIS — Z923 Personal history of irradiation: Secondary | ICD-10-CM

## 2018-09-24 NOTE — Assessment & Plan Note (Signed)
He has chronic intermittent iron deficiency anemia due to chronic bleeding He is taking chronic iron supplement indefinitely.

## 2018-09-24 NOTE — Assessment & Plan Note (Signed)
He had placement of IVC filter remain on Xarelto. Due to poor mobility, and lack of bleeding complications, I recommend he remain on Xarelto indefinitely.

## 2018-09-24 NOTE — Telephone Encounter (Signed)
Gave avs and calendar ° °

## 2018-09-24 NOTE — Assessment & Plan Note (Signed)
From the multiple myeloma standpoint, he has detectable M protein but overall asymptomatic I recommend repeat blood work again in 12 months for further follow-up In the meantime, he will continue calcium with vitamin D supplement

## 2018-09-24 NOTE — Progress Notes (Signed)
[facility] OFFICE PROGRESS NOTE  Patient Care Team: [name], MD as PCP - General (Family Medicine) [name], MD (Inactive) as Consulting Physician (Hematology and Oncology) [name], MD as Consulting Physician (Physical Medicine and Rehabilitation) Donia Guiles Lavon Paganini, PA-C as Physician Assistant (Physical Medicine and Rehabilitation) [name], MD as Consulting Physician (Hematology and Oncology)  ASSESSMENT & PLAN:  Multiple myeloma in remission [facility]) From the multiple myeloma standpoint, he has detectable M protein but overall asymptomatic I recommend repeat blood work again in 12 months for further follow-up In the meantime, he will continue calcium with vitamin D supplement  History of DVT (deep vein thrombosis) He had placement of IVC filter remain on Xarelto. Due to poor mobility, and lack of bleeding complications, I recommend he remain on Xarelto indefinitely.   Iron deficiency anemia He has chronic intermittent iron deficiency anemia due to chronic bleeding He is taking chronic iron supplement indefinitely.   Orders Placed This Encounter  Procedures  . CBC with Differential/Platelet    Standing Status:   Future    Standing Expiration Date:   10/29/2019  . Comprehensive metabolic panel    Standing Status:   Future    Standing Expiration Date:   10/29/2019  . Iron and TIBC    Standing Status:   Future    Standing Expiration Date:   10/29/2019  . Ferritin    Standing Status:   Future    Standing Expiration Date:   09/24/2019  . Multiple Myeloma Panel (SPEP&IFE w/QIG)    Standing Status:   Future    Standing Expiration Date:   10/29/2019  . Kappa/lambda light chains    Standing Status:   Future    Standing Expiration Date:   10/29/2019    INTERVAL HISTORY: Please see below for problem oriented charting. He returns for further follow-up He feels well Denies excessive bleeding He had recent pressure ulcer currently undergoing  treatment No new bone pain Denies recent infection, fever or chills  SUMMARY OF ONCOLOGIC HISTORY:  1. 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 The patient underwent biopsy and cord decompression by Dr. Dierdre Harness on 07/15/2010. He underwent radiation treatments which concluded by 08/22/2010. The patient received treatment with Revlimid, Decadron, and Velcade in combination with Zometa. His initial IgG levels were as high as 5570 back in February 2012. Initial 24-hour urine protein came to 318 mg, positive for IgG heavy chains and lambda light chains. I do not think the patient ever had a bone marrow or a metastatic bone survey; however, CT scans of abdomen and pelvis carried out on 12/09/2010 have shown myelomatous involvement of the bones. A CT scan of the head on 05/05/2011 was normal. The patient has had an  outstanding response to treatment. IgG level from 09/11/2012 was 904. Serum immunofixation electrophoresis continues to show monoclonal IgG lambda protein. It appears that Velcade was discontinued in September 2012 and that Revlimid and Decadron were discontinued in March 2013. 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 The patient is presently on no specific treatment for his IgG lambda multiple myeloma.  2. Paraplegia which developed in December 2011. The patient is status post surgical decompression of the T8 paraspinal mass by Dr. Erline Levine. He is also status post radiation treatments. The patient had chronic  neurogenic bladder and bowel. He has placement of suprapubic catheter. 3. 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 He had an inferior vena cava filter had been placed on November 10, 2010 and he has been on Xarelto since 06/12/2012.  4. History of  sigmoid diverticulitis with perforation in December 2012 resulting in a sigmoid colectomy and end-colostomy on 07/20/2011.  5. He also has adrenal insufficiency on chronic prednisone therapy complicated by diabetes mellitus.  6.He has chronic anemia noted since 2014 treated with IV Feraheme and had infusion reactions.  7. 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 Isac Caddy.  REVIEW OF SYSTEMS:   Constitutional: Denies fevers, chills or abnormal weight loss Eyes: Denies blurriness of vision Ears, nose, mouth, throat, and face: Denies mucositis or sore throat Respiratory: Denies cough, dyspnea or wheezes Cardiovascular: Denies palpitation, chest discomfort or lower extremity swelling Gastrointestinal:  Denies nausea, heartburn or change in bowel habits Lymphatics: Denies new lymphadenopathy or easy bruising Neurological:Denies numbness, tingling or new weaknesses Behavioral/Psych: Mood is stable, no new changes  All other systems were reviewed with the patient and are negative.  I have reviewed the past medical history, past surgical history, social history and family history with the patient and they are unchanged from previous note.  ALLERGIES:  is allergic to feraheme [ferumoxytol].  MEDICATIONS:  Current Outpatient Medications  Medication Sig Dispense Refill  . baclofen (LIORESAL) 20 MG tablet Take 20 mg by mouth 2 (two) times daily.    . brinzolamide (AZOPT) 1 % ophthalmic suspension Place 1 drop into both eyes 2 (two) times daily.    . iron polysaccharides (NU-IRON) 150 MG capsule Take 1 capsule (150 mg total) by mouth daily. 30 capsule 0  . latanoprost (XALATAN) 0.005 % ophthalmic solution Place 1 drop into both eyes at bedtime.    . metFORMIN (GLUCOPHAGE) 500 MG tablet Take 500 mg by mouth 2 (two) times daily with a meal.    . rivaroxaban (XARELTO) 10 MG TABS tablet Take 1 tablet  (10 mg total) by mouth daily with supper. 30 tablet 9  . Skin Protectants, Misc. (EUCERIN) cream Apply 1 application topically 2 (two) times daily as needed for dry skin.     Marland Kitchen zinc oxide (BALMEX) 11.3 % CREA cream Apply 1 application topically 2 (two) times daily.     No current facility-administered medications for this visit.     PHYSICAL EXAMINATION: ECOG PERFORMANCE STATUS: 2 - Symptomatic, <50% confined to bed  Vitals:   09/24/18 0924  BP: 107/71  Pulse: (!) 103  Resp: 18  Temp: 98 F (36.7 C)  SpO2: 100%   There were no vitals filed for this visit.  GENERAL:alert, no distress and comfortable Musculoskeletal:no cyanosis of digits and no clubbing  NEURO: alert & oriented x 3 with fluent speech, no focal motor/sensory deficits  LABORATORY DATA:  I have reviewed the data as listed    Component Value Date/Time   NA 140 09/17/2018 1051   NA 141 03/01/2017 1133   K 3.9 09/17/2018 1051   K 4.0 03/01/2017 1133   CL 104 09/17/2018 1051   CL 104 11/15/2012 0823   CO2 26 09/17/2018 1051   CO2 26 03/01/2017 1133   GLUCOSE 76 09/17/2018 1051   GLUCOSE 89 03/01/2017 1133   GLUCOSE 141 (H) 11/15/2012 0823   BUN 9 09/17/2018 1051   BUN 13.7 03/01/2017 1133  CREATININE 0.82 09/17/2018 1051   CREATININE 0.8 03/01/2017 1133   CALCIUM 9.7 09/17/2018 1051   CALCIUM 10.1 03/01/2017 1133   PROT 8.7 (H) 09/17/2018 1051   PROT 7.7 03/01/2017 1133   PROT 8.1 03/01/2017 1133   ALBUMIN 3.6 09/17/2018 1051   ALBUMIN 3.7 03/01/2017 1133   AST 16 09/17/2018 1051   AST 28 03/01/2017 1133   ALT 15 09/17/2018 1051   ALT 35 03/01/2017 1133   ALKPHOS 79 09/17/2018 1051   ALKPHOS 91 03/01/2017 1133   BILITOT 0.8 09/17/2018 1051   BILITOT 0.60 03/01/2017 1133   GFRNONAA >60 09/17/2018 1051   GFRAA >60 09/17/2018 1051    No results found for: SPEP, UPEP  Lab Results  Component Value Date   WBC 11.8 (H) 09/17/2018   NEUTROABS 8.5 (H) 09/17/2018   HGB 13.3 09/17/2018   HCT 38.7  (L) 09/17/2018   MCV 74.1 (L) 09/17/2018   PLT 380 09/17/2018      Chemistry      Component Value Date/Time   NA 140 09/17/2018 1051   NA 141 03/01/2017 1133   K 3.9 09/17/2018 1051   K 4.0 03/01/2017 1133   CL 104 09/17/2018 1051   CL 104 11/15/2012 0823   CO2 26 09/17/2018 1051   CO2 26 03/01/2017 1133   BUN 9 09/17/2018 1051   BUN 13.7 03/01/2017 1133   CREATININE 0.82 09/17/2018 1051   CREATININE 0.8 03/01/2017 1133   GLU 166 (H) 09/19/2010 1538      Component Value Date/Time   CALCIUM 9.7 09/17/2018 1051   CALCIUM 10.1 03/01/2017 1133   ALKPHOS 79 09/17/2018 1051   ALKPHOS 91 03/01/2017 1133   AST 16 09/17/2018 1051   AST 28 03/01/2017 1133   ALT 15 09/17/2018 1051   ALT 35 03/01/2017 1133   BILITOT 0.8 09/17/2018 1051   BILITOT 0.60 03/01/2017 1133       All questions were answered. The patient knows to call the clinic with any problems, questions or concerns. No barriers to learning was detected.  I spent 15 minutes counseling the patient face to face. The total time spent in the appointment was 20 minutes and more than 50% was on counseling and review of test results  [name], MD 09/24/2018 12:38 PM

## 2019-03-20 ENCOUNTER — Telehealth: Payer: Self-pay | Admitting: Hematology and Oncology

## 2019-03-20 NOTE — Telephone Encounter (Signed)
Printed medical records for patient and spoke with wife to let her know they are ready for pick up and will be located at front desk reception area.

## 2019-09-24 ENCOUNTER — Other Ambulatory Visit: Payer: Self-pay

## 2019-09-24 ENCOUNTER — Inpatient Hospital Stay: Payer: Medicare (Managed Care) | Attending: Hematology and Oncology

## 2019-09-24 DIAGNOSIS — Z923 Personal history of irradiation: Secondary | ICD-10-CM | POA: Insufficient documentation

## 2019-09-24 DIAGNOSIS — D500 Iron deficiency anemia secondary to blood loss (chronic): Secondary | ICD-10-CM

## 2019-09-24 DIAGNOSIS — E119 Type 2 diabetes mellitus without complications: Secondary | ICD-10-CM | POA: Insufficient documentation

## 2019-09-24 DIAGNOSIS — C9002 Multiple myeloma in relapse: Secondary | ICD-10-CM | POA: Diagnosis present

## 2019-09-24 DIAGNOSIS — Z79899 Other long term (current) drug therapy: Secondary | ICD-10-CM | POA: Diagnosis not present

## 2019-09-24 DIAGNOSIS — C9001 Multiple myeloma in remission: Secondary | ICD-10-CM

## 2019-09-24 DIAGNOSIS — Z7984 Long term (current) use of oral hypoglycemic drugs: Secondary | ICD-10-CM | POA: Diagnosis not present

## 2019-09-24 DIAGNOSIS — Z7901 Long term (current) use of anticoagulants: Secondary | ICD-10-CM | POA: Insufficient documentation

## 2019-09-24 DIAGNOSIS — Z7952 Long term (current) use of systemic steroids: Secondary | ICD-10-CM | POA: Insufficient documentation

## 2019-09-24 DIAGNOSIS — Z86718 Personal history of other venous thrombosis and embolism: Secondary | ICD-10-CM

## 2019-09-24 LAB — COMPREHENSIVE METABOLIC PANEL
ALT: 20 U/L (ref 0–44)
AST: 18 U/L (ref 15–41)
Albumin: 3.5 g/dL (ref 3.5–5.0)
Alkaline Phosphatase: 84 U/L (ref 38–126)
Anion gap: 7 (ref 5–15)
BUN: 15 mg/dL (ref 8–23)
CO2: 25 mmol/L (ref 22–32)
Calcium: 9 mg/dL (ref 8.9–10.3)
Chloride: 109 mmol/L (ref 98–111)
Creatinine, Ser: 0.82 mg/dL (ref 0.61–1.24)
GFR calc Af Amer: >60 mL/min (ref >60)
GFR calc non Af Amer: >60 mL/min (ref >60)
Glucose, Bld: 109 mg/dL — ABNORMAL HIGH (ref 70–99)
Potassium: 3.6 mmol/L (ref 3.5–5.1)
Sodium: 141 mmol/L (ref 135–145)
Total Bilirubin: 0.4 mg/dL (ref 0.3–1.2)
Total Protein: 8.3 g/dL — ABNORMAL HIGH (ref 6.5–8.1)

## 2019-09-24 LAB — IRON AND TIBC
Iron: 60 ug/dL (ref 42–163)
Saturation Ratios: 27 % (ref 20–55)
TIBC: 226 ug/dL (ref 202–409)
UIBC: 166 ug/dL (ref 117–376)

## 2019-09-24 LAB — CBC WITH DIFFERENTIAL/PLATELET
Abs Immature Granulocytes: 0.06 10*3/uL (ref 0.00–0.07)
Basophils Absolute: 0.1 10*3/uL (ref 0.0–0.1)
Basophils Relative: 1 %
Eosinophils Absolute: 0.5 10*3/uL (ref 0.0–0.5)
Eosinophils Relative: 7 %
HCT: 34.1 % — ABNORMAL LOW (ref 39.0–52.0)
Hemoglobin: 12 g/dL — ABNORMAL LOW (ref 13.0–17.0)
Immature Granulocytes: 1 %
Lymphocytes Relative: 22 %
Lymphs Abs: 1.6 10*3/uL (ref 0.7–4.0)
MCH: 29.3 pg (ref 26.0–34.0)
MCHC: 35.2 g/dL (ref 30.0–36.0)
MCV: 83.4 fL (ref 80.0–100.0)
Monocytes Absolute: 0.7 10*3/uL (ref 0.1–1.0)
Monocytes Relative: 10 %
Neutro Abs: 4.5 10*3/uL (ref 1.7–7.7)
Neutrophils Relative %: 59 %
Platelets: 258 10*3/uL (ref 150–400)
RBC: 4.09 MIL/uL — ABNORMAL LOW (ref 4.22–5.81)
RDW: 15.4 % (ref 11.5–15.5)
WBC: 7.5 10*3/uL (ref 4.0–10.5)
nRBC: 0 % (ref 0.0–0.2)

## 2019-09-25 LAB — KAPPA/LAMBDA LIGHT CHAINS
Kappa free light chain: 11.9 mg/L (ref 3.3–19.4)
Kappa, lambda light chain ratio: 0.08 — ABNORMAL LOW (ref 0.26–1.65)
Lambda free light chains: 158.5 mg/L — ABNORMAL HIGH (ref 5.7–26.3)

## 2019-09-30 ENCOUNTER — Inpatient Hospital Stay (HOSPITAL_BASED_OUTPATIENT_CLINIC_OR_DEPARTMENT_OTHER): Payer: Medicare (Managed Care) | Admitting: Hematology and Oncology

## 2019-09-30 ENCOUNTER — Other Ambulatory Visit: Payer: Self-pay

## 2019-09-30 VITALS — BP 126/78 | HR 109 | Temp 98.5°F | Resp 18 | Ht 68.0 in

## 2019-09-30 DIAGNOSIS — D500 Iron deficiency anemia secondary to blood loss (chronic): Secondary | ICD-10-CM | POA: Diagnosis not present

## 2019-09-30 DIAGNOSIS — C9001 Multiple myeloma in remission: Secondary | ICD-10-CM

## 2019-09-30 DIAGNOSIS — Z86718 Personal history of other venous thrombosis and embolism: Secondary | ICD-10-CM | POA: Diagnosis not present

## 2019-09-30 DIAGNOSIS — C9002 Multiple myeloma in relapse: Secondary | ICD-10-CM

## 2019-09-30 LAB — MULTIPLE MYELOMA PANEL, SERUM
Albumin SerPl Elph-Mcnc: 3.6 g/dL (ref 2.9–4.4)
Albumin/Glob SerPl: 0.9 (ref 0.7–1.7)
Alpha 1: 0.3 g/dL (ref 0.0–0.4)
Alpha2 Glob SerPl Elph-Mcnc: 0.9 g/dL (ref 0.4–1.0)
B-Globulin SerPl Elph-Mcnc: 1 g/dL (ref 0.7–1.3)
Gamma Glob SerPl Elph-Mcnc: 2.2 g/dL — ABNORMAL HIGH (ref 0.4–1.8)
Globulin, Total: 4.4 g/dL — ABNORMAL HIGH (ref 2.2–3.9)
IgA: 31 mg/dL — ABNORMAL LOW (ref 61–437)
IgG (Immunoglobin G), Serum: 2565 mg/dL — ABNORMAL HIGH (ref 603–1613)
IgM (Immunoglobulin M), Srm: 9 mg/dL — ABNORMAL LOW (ref 20–172)
M Protein SerPl Elph-Mcnc: 1.9 g/dL — ABNORMAL HIGH
Total Protein ELP: 8 g/dL (ref 6.0–8.5)

## 2019-10-01 ENCOUNTER — Telehealth: Payer: Self-pay

## 2019-10-01 ENCOUNTER — Encounter: Payer: Self-pay | Admitting: Hematology and Oncology

## 2019-10-01 NOTE — Progress Notes (Signed)
[place] OFFICE PROGRESS NOTE  Patient Care Team: [name], MD as PCP - General (Family Medicine) [name], MD (Inactive) as Consulting Physician (Hematology and Oncology) [name], MD as Consulting Physician (Physical Medicine and Rehabilitation) Donia Guiles Lavon Paganini, PA-C as Physician Assistant (Physical Medicine and Rehabilitation) [name], MD as Consulting Physician (Hematology and Oncology)  ASSESSMENT & PLAN:  Multiple myeloma in relapse [facility]) At the time of his visit, the myeloma panel was not back I did warn the patient and his wife that the serum light chain is significantly elevated, concerning for disease relapse I have a long discussion with the patient and his wife the natural history of multiple myeloma Unfortunately, after the patient has left, his myeloma panel came back with significantly higher M protein I recommend skeletal survey for proper staging He will return next week to discuss treatment options Currently, he is not symptomatic  History of DVT (deep vein thrombosis) He had placement of IVC filter remain on Xarelto. Due to poor mobility, and lack of bleeding complications, I recommend he remain on Xarelto indefinitely.   Iron deficiency anemia He has chronic intermittent iron deficiency anemia due to chronic bleeding He is taking chronic iron supplement indefinitely.   Orders Placed This Encounter  Procedures  . DG Bone Survey Met    Standing Status:   Future    Standing Expiration Date:   11/30/2020    Order Specific Question:   Reason for Exam (SYMPTOM  OR DIAGNOSIS REQUIRED)    Answer:   staging myeloma    Order Specific Question:   Preferred imaging location?    Answer:   [facility]    All questions were answered. The patient knows to call the clinic with any problems, questions or concerns. The total time spent in the appointment was 30 minutes encounter with patients including review of chart  and various tests results, discussions about plan of care and coordination of care plan   [name], MD 10/01/2019 9:48 AM  INTERVAL HISTORY: Please see below for problem oriented charting. He returns with his wife for further follow-up At the time of discussion, myeloma panel is still pending He denies new bone pain No recent infection, fever or chills He denies further bleeding from hematuria He tolerated reduced dose Xarelto well and oral iron supplement chronically His wife have multiple questions regarding the follow-up and results of his recent light chain  SUMMARY OF ONCOLOGIC HISTORY:  1. 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 The patient underwent biopsy and cord decompression by Dr. Dierdre Harness on 07/15/2010. He underwent radiation treatments which concluded by 08/22/2010. The patient received treatment with Revlimid, Decadron, and Velcade in combination with Zometa. His initial IgG levels were as high as 5570 back in February 2012. Initial 24-hour urine protein came to 318 mg, positive for IgG heavy chains and lambda light chains. I do not think the patient ever had a bone marrow or a metastatic bone survey; however, CT scans of abdomen and pelvis carried out on 12/09/2010 have shown myelomatous involvement of the bones. A CT scan of the head on 05/05/2011 was normal. The patient has had an  outstanding response to treatment. IgG level from 09/11/2012 was 904. Serum immunofixation electrophoresis continues to show monoclonal IgG lambda protein. It appears that Velcade was discontinued in September 2012 and that Revlimid and Decadron  were discontinued in March 2013. 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 The patient is presently on no specific treatment for his IgG  lambda multiple myeloma.  2. Paraplegia which developed in December 2011. The patient is status post surgical decompression of the T8 paraspinal mass by Dr. Erline Levine. He is also status post radiation treatments. The patient had chronic neurogenic bladder and bowel. He has placement of suprapubic catheter. 3. 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 He had an inferior vena cava filter had been placed on November 10, 2010 and he has been on Xarelto since 06/12/2012.  4. 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  5. He also has adrenal insufficiency on chronic prednisone therapy complicated by diabetes mellitus.  6.He has chronic anemia noted since 2014 treated with IV Feraheme and had infusion reactions.  7. 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 Isac Caddy.  REVIEW OF SYSTEMS:   Constitutional: Denies fevers, chills or abnormal weight loss Eyes: Denies blurriness of vision Ears, nose, mouth, throat, and face: Denies mucositis or sore throat Respiratory: Denies cough, dyspnea or wheezes Cardiovascular: Denies palpitation, chest discomfort or lower extremity swelling Gastrointestinal:  Denies nausea, heartburn or change in bowel habits Skin: Denies abnormal skin rashes Lymphatics: Denies new lymphadenopathy or easy bruising Neurological:Denies numbness, tingling or new weaknesses Behavioral/Psych: Mood is stable, no new changes  All other systems were reviewed with the patient and are negative.  I have reviewed the past medical history, past surgical history, social history and family history with the patient and they are unchanged from previous note.  ALLERGIES:  is allergic to feraheme [ferumoxytol].  MEDICATIONS:  Current Outpatient Medications  Medication Sig Dispense  Refill  . baclofen (LIORESAL) 20 MG tablet Take 20 mg by mouth 2 (two) times daily.    . brinzolamide (AZOPT) 1 % ophthalmic suspension Place 1 drop into both eyes 2 (two) times daily.    . iron polysaccharides (NU-IRON) 150 MG capsule Take 1 capsule (150 mg total) by mouth daily. 30 capsule 0  . latanoprost (XALATAN) 0.005 % ophthalmic solution Place 1 drop into both eyes at bedtime.    . metFORMIN (GLUCOPHAGE) 500 MG tablet Take 500 mg by mouth 2 (two) times daily with a meal.    . rivaroxaban (XARELTO) 10 MG TABS tablet Take 1 tablet (10 mg total) by mouth daily with supper. 30 tablet 9  . Skin Protectants, Misc. (EUCERIN) cream Apply 1 application topically 2 (two) times daily as needed for dry skin.     Marland Kitchen zinc oxide (BALMEX) 11.3 % CREA cream Apply 1 application topically 2 (two) times daily.     No current facility-administered medications for this visit.    PHYSICAL EXAMINATION: ECOG PERFORMANCE STATUS: 2 - Symptomatic, <50% confined to bed  Vitals:   09/30/19 0922  BP: 126/78  Pulse: (!) 109  Resp: 18  Temp: 98.5 F (36.9 C)  SpO2: 100%   There were no vitals filed for this visit.  GENERAL:alert, no distress and comfortable NEURO: alert & oriented x 3 with fluent speech, no focal motor/sensory deficits  LABORATORY DATA:  I have reviewed the data as listed    Component Value Date/Time   NA 141 09/24/2019 1137   NA  141 03/01/2017 1133   K 3.6 09/24/2019 1137   K 4.0 03/01/2017 1133   CL 109 09/24/2019 1137   CL 104 11/15/2012 0823   CO2 25 09/24/2019 1137   CO2 26 03/01/2017 1133   GLUCOSE 109 (H) 09/24/2019 1137   GLUCOSE 89 03/01/2017 1133   GLUCOSE 141 (H) 11/15/2012 0823   BUN 15 09/24/2019 1137   BUN 13.7 03/01/2017 1133   CREATININE 0.82 09/24/2019 1137   CREATININE 0.8 03/01/2017 1133   CALCIUM 9.0 09/24/2019 1137   CALCIUM 10.1 03/01/2017 1133   PROT 8.3 (H) 09/24/2019 1137   PROT 7.7 03/01/2017 1133   PROT 8.1 03/01/2017 1133   ALBUMIN 3.5  09/24/2019 1137   ALBUMIN 3.7 03/01/2017 1133   AST 18 09/24/2019 1137   AST 28 03/01/2017 1133   ALT 20 09/24/2019 1137   ALT 35 03/01/2017 1133   ALKPHOS 84 09/24/2019 1137   ALKPHOS 91 03/01/2017 1133   BILITOT 0.4 09/24/2019 1137   BILITOT 0.60 03/01/2017 1133   GFRNONAA >60 09/24/2019 1137   GFRAA >60 09/24/2019 1137    No results found for: SPEP, UPEP  Lab Results  Component Value Date   WBC 7.5 09/24/2019   NEUTROABS 4.5 09/24/2019   HGB 12.0 (L) 09/24/2019   HCT 34.1 (L) 09/24/2019   MCV 83.4 09/24/2019   PLT 258 09/24/2019      Chemistry      Component Value Date/Time   NA 141 09/24/2019 1137   NA 141 03/01/2017 1133   K 3.6 09/24/2019 1137   K 4.0 03/01/2017 1133   CL 109 09/24/2019 1137   CL 104 11/15/2012 0823   CO2 25 09/24/2019 1137   CO2 26 03/01/2017 1133   BUN 15 09/24/2019 1137   BUN 13.7 03/01/2017 1133   CREATININE 0.82 09/24/2019 1137   CREATININE 0.8 03/01/2017 1133   GLU 166 (H) 09/19/2010 1538      Component Value Date/Time   CALCIUM 9.0 09/24/2019 1137   CALCIUM 10.1 03/01/2017 1133   ALKPHOS 84 09/24/2019 1137   ALKPHOS 91 03/01/2017 1133   AST 18 09/24/2019 1137   AST 28 03/01/2017 1133   ALT 20 09/24/2019 1137   ALT 35 03/01/2017 1133   BILITOT 0.4 09/24/2019 1137   BILITOT 0.60 03/01/2017 1133

## 2019-10-01 NOTE — Telephone Encounter (Signed)
Skeletal survey could only be scheduled at 10:00 or 2:00 on 3/19. Scheduled for 10:00 to accommodate for 11:30 appt with Dr Alvy Bimler. Called [facility] at [phone] and lvm with Mendel Ryder to set up transportation for pt to arrive at radiology at 9:45 on 3/19. Spoke with pt by phone to give above appt times.  Pt request we call his wife. Called wife and lvm for her to return call.

## 2019-10-01 NOTE — Assessment & Plan Note (Signed)
He had placement of IVC filter remain on Xarelto. Due to poor mobility, and lack of bleeding complications, I recommend he remain on Xarelto indefinitely.

## 2019-10-01 NOTE — Assessment & Plan Note (Signed)
He has chronic intermittent iron deficiency anemia due to chronic bleeding He is taking chronic iron supplement indefinitely.

## 2019-10-01 NOTE — Assessment & Plan Note (Addendum)
At the time of his visit, the myeloma panel was not back I did warn the patient and his wife that the serum light chain is significantly elevated, concerning for disease relapse I have a long discussion with the patient and his wife the natural history of multiple myeloma Unfortunately, after the patient has left, his myeloma panel came back with significantly higher M protein I recommend skeletal survey for proper staging He will return next week to discuss treatment options Currently, he is not symptomatic

## 2019-10-02 ENCOUNTER — Telehealth: Payer: Self-pay

## 2019-10-02 ENCOUNTER — Ambulatory Visit (HOSPITAL_COMMUNITY)
Admission: RE | Admit: 2019-10-02 | Discharge: 2019-10-02 | Disposition: A | Discharge status: Home / Self Care | Payer: Medicare (Managed Care) | Source: Ambulatory Visit | Attending: Hematology and Oncology | Admitting: Hematology and Oncology

## 2019-10-02 ENCOUNTER — Other Ambulatory Visit: Payer: Self-pay

## 2019-10-02 DIAGNOSIS — C9001 Multiple myeloma in remission: Secondary | ICD-10-CM

## 2019-10-02 NOTE — Telephone Encounter (Signed)
Called and spoke with wife. Moved bone survey aptt to 3/18 at 1000, arrive at 0945 for appt in radiology at [facility]. Appt with Dr. Alvy Bimler moved to 3/18 at 1230. Wife aware of appts and verbalized understanding.  Wife will arrange transportation with [facility].

## 2019-10-09 ENCOUNTER — Encounter: Payer: Self-pay | Admitting: Hematology and Oncology

## 2019-10-09 ENCOUNTER — Ambulatory Visit (HOSPITAL_COMMUNITY): Payer: Medicare (Managed Care)

## 2019-10-09 ENCOUNTER — Inpatient Hospital Stay (HOSPITAL_BASED_OUTPATIENT_CLINIC_OR_DEPARTMENT_OTHER): Payer: Medicare (Managed Care) | Admitting: Hematology and Oncology

## 2019-10-09 ENCOUNTER — Other Ambulatory Visit: Payer: Self-pay

## 2019-10-09 VITALS — BP 132/108 | HR 110 | Temp 97.8°F | Resp 18

## 2019-10-09 DIAGNOSIS — Z7189 Other specified counseling: Secondary | ICD-10-CM | POA: Diagnosis not present

## 2019-10-09 DIAGNOSIS — M87180 Osteonecrosis due to drugs, jaw: Secondary | ICD-10-CM | POA: Diagnosis not present

## 2019-10-09 DIAGNOSIS — C9002 Multiple myeloma in relapse: Secondary | ICD-10-CM

## 2019-10-09 NOTE — Progress Notes (Signed)
[place] OFFICE PROGRESS NOTE  Patient Care Team: [name], MD as PCP - General (Family Medicine) [name], MD as Consulting Physician (Physical Medicine and Rehabilitation) Donia Guiles Lavon Paganini, PA-C as Physician Assistant (Physical Medicine and Rehabilitation) [name], MD as Consulting Physician (Hematology and Oncology)  ASSESSMENT & PLAN:  Multiple myeloma in relapse [facility]) I have reviewed test results with the patient I gave him copies of his recent myeloma panel along with reviewing recent skeletal survey with the patient Unfortunately, he has new bone lesions on both proximal humerus Given his current situation with debility, I am concerned that he might be putting too much weight on his humerus that could precipitate risk of pathological fracture I will request urgent orthopedic consult to see if prophylactic surgery is appropriate Once we have cleared from that standpoint, we can start palliative chemotherapy  I reviewed the current guidelines with the patient and his wife Given his history of thrombosis, I would like to avoid pomalidomide and lenalidomide Given his history of bladder bleeding, I would like to avoid cyclophosphamide  I think combination chemotherapy with daratumumab, bortezomib and dexamethasone would be a good choice I will substitute intravenous daratumumab with subcutaneous injection to be given on the same day of bortezomib We will give him oral dexamethasone as premedication before his treatment He will need dental clearance before we proceed with Zometa He will need prophylactic antiviral treatment with acyclovir He will need baseline blood work and pretreatment RBC phenotype before we can proceed with daratumumab I will schedule once we receive confirmation from orthopedic surgery that he does not need treatment or completed prophylactic surgery before we start him on chemotherapy  I will probably see him back for  final consent in a few weeks  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 CBC with Differential (Cancer Center Only)    Standing Status:   Standing    Number of Occurrences:   20    Standing Expiration Date:   10/08/2020  . CMP ([place] only)    Standing Status:   Standing    Number of Occurrences:   20    Standing Expiration Date:   10/08/2020  . Pretreatment RBC phenotype    Obtain prior to daratumumab treatment.    Standing Status:   Future    Standing Expiration Date:   10/08/2020  . AMB referral to orthopedics    Referral Priority:   Urgent    Referral Type:   Consultation    Number of Visits Requested:   1  . Type and screen    Obtain prior to daratumumab treatment.    Standing Status:   Future    Standing Expiration Date:   10/08/2020    All questions were answered. The patient knows to call the clinic with any problems, questions or concerns. The total time spent in the appointment was 40 minutes encounter with patients including review of chart and various tests results, discussions about plan of care and coordination of care plan   [name], MD 10/09/2019 2:08 PM  INTERVAL HISTORY: Please see below for problem oriented charting. 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      REVIEW OF SYSTEMS:   Constitutional: Denies fevers, chills or abnormal weight loss Eyes: Denies blurriness of vision Ears, nose, mouth, throat, and face: Denies mucositis or sore throat Respiratory: Denies cough, dyspnea or wheezes Cardiovascular: Denies palpitation, chest discomfort or lower extremity swelling Gastrointestinal:  Denies nausea, heartburn or change in bowel habits Skin: Denies abnormal skin rashes Lymphatics: Denies new lymphadenopathy or easy bruising Neurological:Denies numbness, tingling or new weaknesses Behavioral/Psych: Mood is stable, no new changes  All other systems were reviewed with the patient and are negative.  I have reviewed the past medical history, past surgical history, social history and family history with the patient and they are unchanged from previous note.  ALLERGIES:  is allergic to feraheme [ferumoxytol].  MEDICATIONS:  Current Outpatient Medications  Medication Sig Dispense Refill  . baclofen (LIORESAL) 20 MG tablet Take 20 mg by mouth 2 (two) times daily.    . brinzolamide (AZOPT) 1 % ophthalmic suspension Place 1 drop into both eyes 2 (two) times daily.    . iron polysaccharides (NU-IRON) 150 MG capsule Take 1 capsule (150 mg total) by mouth daily. 30 capsule 0  . latanoprost (XALATAN) 0.005 % ophthalmic solution Place 1 drop into both eyes at bedtime.    . metFORMIN (GLUCOPHAGE) 500 MG tablet Take 500 mg by mouth 2 (two) times daily with a meal.    . rivaroxaban (XARELTO) 10 MG TABS tablet Take 1 tablet (10 mg total) by mouth daily with supper. 30 tablet 9  . Skin Protectants, Misc. (EUCERIN) cream Apply 1 application topically 2 (two) times daily as needed for dry skin.     Marland Kitchen zinc oxide (BALMEX) 11.3 % CREA cream Apply 1 application topically 2 (two) times daily.     No current facility-administered medications for this visit.    PHYSICAL EXAMINATION: ECOG  PERFORMANCE STATUS: 2 - Symptomatic, <50% confined to bed  Vitals:   10/09/19 1220  BP: (!) 132/108  Pulse: (!) 110  Resp: 18  Temp: 97.8 F (36.6 C)  SpO2: 100%   There were no vitals filed for this visit.  GENERAL:alert, no distress and comfortable  NEURO: alert & oriented x 3 with fluent speech, no focal motor/sensory deficits  LABORATORY DATA:  I have reviewed the data as listed    Component Value Date/Time   NA 141 09/24/2019 1137   NA 141 03/01/2017 1133   K 3.6 09/24/2019 1137   K 4.0 03/01/2017 1133   CL 109 09/24/2019 1137   CL 104 11/15/2012 0823   CO2 25 09/24/2019 1137   CO2 26 03/01/2017 1133   GLUCOSE 109 (H) 09/24/2019 1137   GLUCOSE 89 03/01/2017 1133   GLUCOSE 141 (H) 11/15/2012 0823   BUN 15 09/24/2019 1137   BUN 13.7 03/01/2017 1133   CREATININE 0.82 09/24/2019 1137   CREATININE 0.8 03/01/2017 1133   CALCIUM 9.0 09/24/2019 1137   CALCIUM 10.1 03/01/2017 1133   PROT 8.3 (H) 09/24/2019 1137   PROT 7.7 03/01/2017 1133   PROT 8.1 03/01/2017 1133   ALBUMIN  3.5 09/24/2019 1137   ALBUMIN 3.7 03/01/2017 1133   AST 18 09/24/2019 1137   AST 28 03/01/2017 1133   ALT 20 09/24/2019 1137   ALT 35 03/01/2017 1133   ALKPHOS 84 09/24/2019 1137   ALKPHOS 91 03/01/2017 1133   BILITOT 0.4 09/24/2019 1137   BILITOT 0.60 03/01/2017 1133   GFRNONAA >60 09/24/2019 1137   GFRAA >60 09/24/2019 1137    No results found for: SPEP, UPEP  Lab Results  Component Value Date   WBC 7.5 09/24/2019   NEUTROABS 4.5 09/24/2019   HGB 12.0 (L) 09/24/2019   HCT 34.1 (L) 09/24/2019   MCV 83.4 09/24/2019   PLT 258 09/24/2019      Chemistry      Component Value Date/Time   NA 141 09/24/2019 1137   NA 141 03/01/2017 1133   K 3.6 09/24/2019 1137   K 4.0 03/01/2017 1133   CL 109 09/24/2019 1137   CL 104 11/15/2012 0823   CO2 25 09/24/2019 1137   CO2 26 03/01/2017 1133   BUN 15 09/24/2019 1137   BUN 13.7 03/01/2017 1133   CREATININE 0.82 09/24/2019 1137    CREATININE 0.8 03/01/2017 1133   GLU 166 (H) 09/19/2010 1538      Component Value Date/Time   CALCIUM 9.0 09/24/2019 1137   CALCIUM 10.1 03/01/2017 1133   ALKPHOS 84 09/24/2019 1137   ALKPHOS 91 03/01/2017 1133   AST 18 09/24/2019 1137   AST 28 03/01/2017 1133   ALT 20 09/24/2019 1137   ALT 35 03/01/2017 1133   BILITOT 0.4 09/24/2019 1137   BILITOT 0.60 03/01/2017 1133       RADIOGRAPHIC STUDIES: I have reviewed multiple imaging studies with his wife I have personally reviewed the radiological images as listed and agreed with the findings in the report. DG Bone Survey Met  Result Date: 10/03/2019 CLINICAL DATA:  Multiple myeloma EXAM: METASTATIC BONE SURVEY COMPARISON:  Chest radiograph dated 08/21/2018. CT abdomen/pelvis dated 07/01/2014. Metastatic bone survey dated 05/13/2014. FINDINGS: Skull and cervical spine are unremarkable. Bilateral upper extremities are unremarkable. Expansile lytic lesion involving the left lateral 6th rib with associated deformity, chronic. Prior lateral thoracic fusion at T6-8 with T7 corpectomy, chronic. Stable mild superior endplate changes at O29. On the frontal view of the lumbar spine, there is a possible lytic lesion in the right aspect of the T12 vertebral body, but this favors underlying gas within the stomach as it is not supported on additional views. Lumbar spine is unremarkable. Visualized bony pelvis is unremarkable. Layering bladder calculi, measuring up to 5.6 cm. IVC filter. Mild to moderate degenerative changes of the bilateral hips, right greater than left. New lytic lesion in the mid shaft of the left humerus, measuring 2.7 cm. New lytic lesions in the proximal shaft of the right humerus, measuring up to 5.3 cm. Distal lower extremities are unremarkable. IMPRESSION: New lytic lesions in the bilateral humerii, as above. Stable expansile lesion in the left 6th rib. Stable postsurgical changes involving T6-8. Additional stable ancillary findings as  above. Electronically Signed   By: Julian Hy M.D.   On: 10/03/2019 07:48

## 2019-10-09 NOTE — Assessment & Plan Note (Signed)
He has remote history of osteonecrosis of the jaw I will need confirmation from his dentist it is okay to proceed before repeating treatment with Zometa

## 2019-10-09 NOTE — Progress Notes (Signed)
START ON PATHWAY REGIMEN - Multiple Myeloma and Other Plasma Cell Dyscrasias     Cycles 1 through 3: A cycle is every 21 days:     Dexamethasone      Bortezomib      Daratumumab and hyaluronidase-fihj    Cycles 4 through 8: A cycle is every 21 days:     Dexamethasone      Bortezomib      Daratumumab and hyaluronidase-fihj    Cycles 9 and beyond: A cycle is every 28 days:     Daratumumab and hyaluronidase-fihj   **Always confirm dose/schedule in your pharmacy ordering system**  Administration Notes: weekly Velcade preferred  Patient Characteristics: Multiple Myeloma, Relapsed / Refractory, Second through Fourth Lines of Therapy Disease Classification: Multiple Myeloma R-ISS Staging: II Therapeutic Status: Relapsed Line of Therapy: Second Line Intent of Therapy: Non-Curative / Palliative Intent, Discussed with Patient

## 2019-10-09 NOTE — Assessment & Plan Note (Signed)
I have reviewed test results with the patient I gave him copies of his recent myeloma panel along with reviewing recent skeletal survey with the patient Unfortunately, he has new bone lesions on both proximal humerus Given his current situation with debility, I am concerned that he might be putting too much weight on his humerus that could precipitate risk of pathological fracture I will request urgent orthopedic consult to see if prophylactic surgery is appropriate Once we have cleared from that standpoint, we can start palliative chemotherapy  I reviewed the current guidelines with the patient and his wife Given his history of thrombosis, I would like to avoid pomalidomide and lenalidomide Given his history of bladder bleeding, I would like to avoid cyclophosphamide  I think combination chemotherapy with daratumumab, bortezomib and dexamethasone would be a good choice I will substitute intravenous daratumumab with subcutaneous injection to be given on the same day of bortezomib We will give him oral dexamethasone as premedication before his treatment He will need dental clearance before we proceed with Zometa He will need prophylactic antiviral treatment with acyclovir He will need baseline blood work and pretreatment RBC phenotype before we can proceed with daratumumab I will schedule once we receive confirmation from orthopedic surgery that he does not need treatment or completed prophylactic surgery before we start him on chemotherapy  I will probably see him back for final consent in a few weeks

## 2019-10-10 ENCOUNTER — Ambulatory Visit (HOSPITAL_COMMUNITY): Payer: Medicare (Managed Care)

## 2019-10-10 ENCOUNTER — Inpatient Hospital Stay: Payer: Medicare (Managed Care) | Admitting: Hematology and Oncology

## 2019-10-23 ENCOUNTER — Ambulatory Visit (INDEPENDENT_AMBULATORY_CARE_PROVIDER_SITE_OTHER): Payer: Medicare (Managed Care) | Admitting: Orthopedic Surgery

## 2019-10-23 ENCOUNTER — Other Ambulatory Visit: Payer: Self-pay

## 2019-10-23 ENCOUNTER — Encounter: Payer: Self-pay | Admitting: Orthopedic Surgery

## 2019-10-23 VITALS — Ht 68.0 in | Wt 241.0 lb

## 2019-10-23 DIAGNOSIS — C9002 Multiple myeloma in relapse: Secondary | ICD-10-CM

## 2019-10-27 ENCOUNTER — Encounter: Payer: Self-pay | Admitting: Orthopedic Surgery

## 2019-10-27 NOTE — Progress Notes (Signed)
Office Visit Note   Patient: [name]           Date of Birth: 15-Mar-1956           MRN: [id number] Visit Date: 10/23/2019              Requested by: [name], MD [address] [place],  [place] [id number] PCP: [name], MD  Chief Complaint  Patient presents with  . Right Arm - Pain  . Left Arm - Pain      HPI: Patient is a 64 year old gentleman who is seen for initial evaluation for potential lytic lesions in his humerus from multiple myeloma.  Patient is seen for initial evaluation for potential need for prophylactic internal fixation to decrease risk of humeral fracture.  Patient states he does not have any pain in either arm he states he is in a wheelchair all the time uses his arms for ambulation secondary to paralysis of both lower extremities.  Assessment & Plan: Visit Diagnoses:  1. Multiple myeloma in relapse [facility])     Plan: Will order a CT scan of both humerus anticipate this may need to be approved by pace.  We will follow up once the CT scans of both humerus are obtained.  Follow-Up Instructions: Return in about 2 weeks (around 11/06/2019).   Ortho Exam  Patient is alert, oriented, no adenopathy, well-dressed, normal affect, normal respiratory effort. Examination patient is in a wheelchair both lower extremities are paralyzed review of the radiographs does show a lytic lesion of the right femur this appears to involve less than 50% of the cortex and due to the fact that patient is asymptomatic and paralyzed in both legs surgical intervention should not be warranted for the femoral lesion.  Examination of his upper extremities patient has no tenderness to palpation in either arm.  Review of the radiographs does not show any scalloping of the cortex.  Patient does have some enlargement of the right humerus.  Imaging: No results found. No images are attached to the encounter.  Labs: Lab Results  Component Value Date   HGBA1C 6.0 (H)  07/22/2014   ESRSEDRATE 73 (H) 10/21/2013   ESRSEDRATE 55 (H) 07/23/2013   ESRSEDRATE 95 (H) 07/13/2010   CRP 1.0 (H) 07/14/2010   LABURIC 8.2 (H) 07/21/2014   LABURIC 5.1 09/02/2010   REPTSTATUS 04/23/2017 FINAL 04/21/2017   CULT 60,000 COLONIES/mL PROTEUS MIRABILIS (A) 04/21/2017   LABORGA PROTEUS MIRABILIS (A) 04/21/2017     Lab Results  Component Value Date   ALBUMIN 3.5 09/24/2019   ALBUMIN 3.6 09/17/2018   ALBUMIN 3.2 (L) 03/14/2018   LABURIC 8.2 (H) 07/21/2014   LABURIC 5.1 09/02/2010    Lab Results  Component Value Date   MG 1.5 07/21/2014   MG 1.3 (L) 02/13/2013   MG 2.3 07/24/2011   Lab Results  Component Value Date   VD25OH 59 04/08/2013   VD25OH 58 01/16/2013   VD25OH 63 11/15/2012    No results found for: PREALBUMIN CBC EXTENDED Latest Ref Rng & Units 09/24/2019 09/17/2018 03/14/2018  WBC 4.0 - 10.5 K/uL 7.5 11.8(H) 7.7  RBC 4.22 - 5.81 MIL/uL 4.09(L) 5.22 4.89  HGB 13.0 - 17.0 g/dL 12.0(L) 13.3 12.2(L)  HCT 39.0 - 52.0 % 34.1(L) 38.7(L) 35.7(L)  PLT 150 - 400 K/uL 258 380 360  NEUTROABS 1.7 - 7.7 K/uL 4.5 8.5(H) 4.6  LYMPHSABS 0.7 - 4.0 K/uL 1.6 2.0 1.9     Body mass index is  36.64 kg/m.  Orders:  Orders Placed This Encounter  Procedures  . CT HUMERUS RIGHT WO CONTRAST  . CT HUMERUS LEFT WO CONTRAST   No orders of the defined types were placed in this encounter.    Procedures: No procedures performed  Clinical Data: No additional findings.  ROS:  All other systems negative, except as noted in the HPI. Review of Systems  Objective: Vital Signs: Ht 5' 8"  (1.727 m)   Wt 241 lb (109.3 kg)   BMI 36.64 kg/m   Specialty Comments:  No specialty comments available.  PMFS History: Patient Active Problem List   Diagnosis Date Noted  . Goals of care, counseling/discussion 10/09/2019  . On rivaroxaban therapy   . Status post colonoscopy with polypectomy   . Lower GI bleeding 04/18/2017  . Acute blood loss anemia 04/18/2017  .  Diabetes mellitus type 2 in obese ([place]) 04/18/2017  . Colon cancer screening   . Benign neoplasm of ascending colon   . History of DVT (deep vein thrombosis) 06/10/2015  . Obstructed suprapubic catheter ([place]) 07/21/2014  . Bilateral hydronephrosis 07/21/2014  . Acute kidney failure ([place]) 07/20/2014  . Abdominal pain 07/20/2014  . AKI (acute kidney injury) ([place]) 07/20/2014  . Hematuria 02/12/2013  . Shock ([place]) 02/12/2013  . Adrenal insufficiency ([place]) 02/12/2013  . Hypoalbuminemia 11/15/2012  . Anemia, unspecified 09/18/2012  . Unspecified vitamin D deficiency 09/18/2012  . 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 Iron deficiency anemia 09/18/2012  . Diverticulitis of large intestine with perforation 07/26/2011  . Multiple myeloma in relapse ([place]) 10/14/2010  . Neurogenic bowel 10/14/2010  . Paraplegia ([place]) 07/13/2010  . Neurogenic bladder 07/11/2010  . BACK PAIN, LUMBAR, WITH RADICULOPATHY 07/07/2010  . DISEQUILIBRIUM 07/07/2010  . Abdominal pain, generalized 07/07/2010  . OBESITY, NOS 09/20/2006   Past Medical History:  Diagnosis Date  . Adrenal insufficiency (HCC)    on chronic dexamethasone  . Anemia   . Cancer ([place])   . Coagulopathy ([place])    on xeralto/ s/p DVT while on coumadin,  IVC in place  . Diabetes mellitus without complication ([place])    type 2  . Gross hematuria 7/14   post foley cath procedure  . History of blood transfusion 7/14  . Multiple myeloma    thoracic T8 with paraplegia s/p resection- on chemo at visit 10/13/10  . Multiple myeloma   . Multiple myeloma without mention of remission   . Neurogenic bladder   . Neurogenic bowel   . Paraplegia ([place])   . Partial small bowel obstruction ([place]) during dec 2011 admission    Family History  Problem Relation Age of Onset  . Cancer Mother   . Ovarian cancer Mother   . Diabetes Father     Past Surgical History:  Procedure Laterality Date  . COLONOSCOPY WITH PROPOFOL N/A  04/12/2017   Procedure: COLONOSCOPY WITH PROPOFOL;  Surgeon: Irene Shipper, MD;  Location: [facility];  Service: Endoscopy;  Laterality: N/A;  . COLONOSCOPY WITH PROPOFOL N/A 04/19/2017   Procedure: COLONOSCOPY WITH PROPOFOL;  Surgeon: Yetta Flock, MD;  Location: [facility];  Service: Gastroenterology;  Laterality: N/A;  . COLOSTOMY  07/20/2011   Procedure: COLOSTOMY;  Surgeon: Judieth Keens, DO;  Location: [place];  Service: General;;  . COLOSTOMY REVISION  07/20/2011   Procedure: COLON RESECTION SIGMOID;  Surgeon: Judieth Keens, DO;  Location: [place];  Service: General;;  . CYSTOSCOPY N/A 04/04/2013   Procedure: CYSTOSCOPY WITH  LITHALOPAXY;  Surgeon: Alexis Frock, MD;  Location[facility];  Service: Urology;  Laterality: N/A;  . INSERTION OF SUPRAPUBIC CATHETER N/A 04/04/2013   Procedure: INSERTION OF SUPRAPUBIC CATHETER;  Surgeon: Alexis Frock, MD;  [facility];  Service: Urology;  Laterality: N/A;  . LAPAROTOMY  07/20/2011   Procedure: EXPLORATORY LAPAROTOMY;  Surgeon: Judieth Keens, DO;  Location: [place];  Service: General;  Laterality: N/A;  . myeloma thoracic T8 with parpaplegia s/p thoracotomy and thoracic T7-9 cage placement on Dec 26th 2011  07/18/10   Social History   Occupational History  . Not on file  Tobacco Use  . Smoking status: Never Smoker  . Smokeless tobacco: Never Used  Substance and Sexual Activity  . Alcohol use: No  . Drug use: No  . Sexual activity: Never

## 2019-10-29 ENCOUNTER — Other Ambulatory Visit: Payer: Self-pay | Admitting: Family Medicine

## 2019-10-29 DIAGNOSIS — C9002 Multiple myeloma in relapse: Secondary | ICD-10-CM

## 2019-10-29 DIAGNOSIS — D849 Immunodeficiency, unspecified: Secondary | ICD-10-CM

## 2019-10-30 ENCOUNTER — Other Ambulatory Visit (HOSPITAL_COMMUNITY): Payer: Self-pay | Admitting: Family Medicine

## 2019-10-30 DIAGNOSIS — D849 Immunodeficiency, unspecified: Secondary | ICD-10-CM

## 2019-10-30 DIAGNOSIS — M899 Disorder of bone, unspecified: Secondary | ICD-10-CM

## 2019-11-07 ENCOUNTER — Ambulatory Visit (HOSPITAL_COMMUNITY): Payer: Medicare (Managed Care)

## 2019-11-11 ENCOUNTER — Ambulatory Visit: Payer: Medicare (Managed Care) | Admitting: Orthopedic Surgery

## 2019-11-13 ENCOUNTER — Other Ambulatory Visit: Payer: Self-pay

## 2019-11-13 ENCOUNTER — Ambulatory Visit (HOSPITAL_COMMUNITY)
Admission: RE | Admit: 2019-11-13 | Discharge: 2019-11-13 | Disposition: A | Discharge status: Home / Self Care | Payer: Medicare (Managed Care) | Source: Ambulatory Visit | Attending: Family Medicine | Admitting: Family Medicine

## 2019-11-13 DIAGNOSIS — D849 Immunodeficiency, unspecified: Secondary | ICD-10-CM

## 2019-11-13 DIAGNOSIS — M899 Disorder of bone, unspecified: Secondary | ICD-10-CM

## 2019-11-17 ENCOUNTER — Other Ambulatory Visit: Payer: Self-pay | Admitting: Family Medicine

## 2019-11-17 ENCOUNTER — Ambulatory Visit: Payer: Medicare (Managed Care) | Admitting: Orthopedic Surgery

## 2019-11-17 DIAGNOSIS — D849 Immunodeficiency, unspecified: Secondary | ICD-10-CM

## 2019-11-17 DIAGNOSIS — M899 Disorder of bone, unspecified: Secondary | ICD-10-CM

## 2019-11-28 ENCOUNTER — Ambulatory Visit (HOSPITAL_COMMUNITY)
Admission: RE | Admit: 2019-11-28 | Discharge: 2019-11-28 | Disposition: A | Discharge status: Home / Self Care | Payer: Medicare (Managed Care) | Source: Ambulatory Visit | Attending: Family Medicine | Admitting: Family Medicine

## 2019-11-28 ENCOUNTER — Other Ambulatory Visit: Payer: Self-pay

## 2019-11-28 DIAGNOSIS — D849 Immunodeficiency, unspecified: Secondary | ICD-10-CM | POA: Diagnosis not present

## 2019-11-28 DIAGNOSIS — M899 Disorder of bone, unspecified: Secondary | ICD-10-CM

## 2019-12-02 ENCOUNTER — Encounter: Payer: Self-pay | Admitting: Orthopedic Surgery

## 2019-12-02 ENCOUNTER — Other Ambulatory Visit: Payer: Self-pay

## 2019-12-02 ENCOUNTER — Ambulatory Visit (INDEPENDENT_AMBULATORY_CARE_PROVIDER_SITE_OTHER): Payer: Medicare (Managed Care) | Admitting: Orthopedic Surgery

## 2019-12-02 DIAGNOSIS — M899 Disorder of bone, unspecified: Secondary | ICD-10-CM | POA: Diagnosis not present

## 2019-12-02 DIAGNOSIS — C9002 Multiple myeloma in relapse: Secondary | ICD-10-CM

## 2019-12-02 NOTE — Progress Notes (Signed)
Office Visit Note   Patient: [name]           Date of Birth: [birth year]           MRN: [id number] Visit Date: 12/02/2019              Requested by: [name], MD [address] [place],  [place] [address] PCP: [name], MD  Chief Complaint  Patient presents with  . Right Arm - Follow-up      HPI: Patient is a 64 year old gentleman with multiple myeloma with lytic lesions of both femurs right greater than left.  Patient is status post CT scans.  Patient is paralyzed from the waist down and insensate from the waist down with the multiple myeloma.  Patient is total assist with a Hoyer lift.  Assessment & Plan: Visit Diagnoses:  1. Lytic bone lesion of right femur   2. Lytic bone lesion of left femur   3. Multiple myeloma in relapse [facility])     Plan: Discussed with the patient and his wife  the ideal treatment for these large metastatic multiple myeloma lesions would be to proceed with intramedullary nailing of both femurs.  Discussed that since patient is currently asymptomatic we could follow this with serial radiographs.  Discussed that if the lesions became larger we could proceed with intramedullary nailing of both femurs at that time.  Discussed that if he was dropped from his Harrel Lemon lift the right femur would most likely fracture and fixing after fracture would be more difficult than prophylactically nailing the femurs.  Patient and his wife state they will discuss this with their children further to determine their level of comfort with the treatment proposed.  Discussed that with surgery we would proceed with bilateral recon nails for both femurs locked proximally into the head and distally with 2 locking screws.  Follow-Up Instructions: Return in about 3 months (around 03/03/2020).   Ortho Exam  Patient is alert, oriented, no adenopathy, well-dressed, normal affect, normal respiratory effort. Examination patient's legs are asymptomatic.  His CT scans  were reviewed and the lesion in the right femur is 7 cm in length and involves approximately 50% of the cortex.  The lesion is not in the subtroches region but is in the proximal third of the femoral diaphysis.  This does place him in a high risk for pathologic fracture.  The left femoral lesion is midshaft approximately 2 cm in length and involves approximately 25% of the cortex.  The left femoral lesion is in the mid shaft of the femoral diaphysis.  Patient has good arm strength and can self propel himself with the wheelchair.  Imaging: No results found. No images are attached to the encounter.  Labs: Lab Results  Component Value Date   HGBA1C 6.0 (H) 07/22/2014   ESRSEDRATE 73 (H) 10/21/2013   ESRSEDRATE 55 (H) 07/23/2013   ESRSEDRATE 95 (H) 07/13/2010   CRP 1.0 (H) 07/14/2010   LABURIC 8.2 (H) 07/21/2014   LABURIC 5.1 09/02/2010   REPTSTATUS 04/23/2017 FINAL 04/21/2017   CULT 60,000 COLONIES/mL PROTEUS MIRABILIS (A) 04/21/2017   LABORGA PROTEUS MIRABILIS (A) 04/21/2017     Lab Results  Component Value Date   ALBUMIN 3.5 09/24/2019   ALBUMIN 3.6 09/17/2018   ALBUMIN 3.2 (L) 03/14/2018   LABURIC 8.2 (H) 07/21/2014   LABURIC 5.1 09/02/2010    Lab Results  Component Value Date   MG 1.5 07/21/2014   MG 1.3 (L) 02/13/2013  MG 2.3 07/24/2011   Lab Results  Component Value Date   VD25OH 59 04/08/2013   VD25OH 58 01/16/2013   VD25OH 63 11/15/2012    No results found for: PREALBUMIN CBC EXTENDED Latest Ref Rng & Units 09/24/2019 09/17/2018 03/14/2018  WBC 4.0 - 10.5 K/uL 7.5 11.8(H) 7.7  RBC 4.22 - 5.81 MIL/uL 4.09(L) 5.22 4.89  HGB 13.0 - 17.0 g/dL 12.0(L) 13.3 12.2(L)  HCT 39.0 - 52.0 % 34.1(L) 38.7(L) 35.7(L)  PLT 150 - 400 K/uL 258 380 360  NEUTROABS 1.7 - 7.7 K/uL 4.5 8.5(H) 4.6  LYMPHSABS 0.7 - 4.0 K/uL 1.6 2.0 1.9     There is no height or weight on file to calculate BMI.  Orders:  No orders of the defined types were placed in this encounter.  No orders of  the defined types were placed in this encounter.    Procedures: No procedures performed  Clinical Data: No additional findings.  ROS:  All other systems negative, except as noted in the HPI. Review of Systems  Objective: Vital Signs: There were no vitals taken for this visit.  Specialty Comments:  No specialty comments available.  PMFS History: Patient Active Problem List   Diagnosis Date Noted  . Goals of care, counseling/discussion 10/09/2019  . On rivaroxaban therapy   . Status post colonoscopy with polypectomy   . Lower GI bleeding 04/18/2017  . Acute blood loss anemia 04/18/2017  . Diabetes mellitus type 2 in obese ([place]) 04/18/2017  . Colon cancer screening   . Benign neoplasm of ascending colon   . History of DVT (deep vein thrombosis) 06/10/2015  . Obstructed suprapubic catheter ([place]) 07/21/2014  . Bilateral hydronephrosis 07/21/2014  . Acute kidney failure ([place]) 07/20/2014  . Abdominal pain 07/20/2014  . AKI (acute kidney injury) ([place]) 07/20/2014  . Hematuria 02/12/2013  . Shock ([place]) 02/12/2013  . Adrenal insufficiency ([place]) 02/12/2013  . Hypoalbuminemia 11/15/2012  . Anemia, unspecified 09/18/2012  . Unspecified vitamin D deficiency 09/18/2012  . 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 Iron deficiency anemia 09/18/2012  . Diverticulitis of large intestine with perforation 07/26/2011  . Multiple myeloma in relapse ([place]) 10/14/2010  . Neurogenic bowel 10/14/2010  . Paraplegia ([place]) 07/13/2010  . Neurogenic bladder 07/11/2010  . BACK PAIN, LUMBAR, WITH RADICULOPATHY 07/07/2010  . DISEQUILIBRIUM 07/07/2010  . Abdominal pain, generalized 07/07/2010  . OBESITY, NOS 09/20/2006   Past Medical History:  Diagnosis Date  . Adrenal insufficiency (HCC)    on chronic dexamethasone  . Anemia   . Cancer ([place])   . Coagulopathy ([place])    on xeralto/ s/p DVT while on coumadin,  IVC in place  . Diabetes mellitus without complication  ([place])    type 2  . Gross hematuria 7/14   post foley cath procedure  . History of blood transfusion 7/14  . Multiple myeloma    thoracic T8 with paraplegia s/p resection- on chemo at visit 10/13/10  . Multiple myeloma   . Multiple myeloma without mention of remission   . Neurogenic bladder   . Neurogenic bowel   . Paraplegia ([place])   . Partial small bowel obstruction ([place]) during dec 2011 admission    Family History  Problem Relation Age of Onset  . Cancer Mother   . Ovarian cancer Mother   . Diabetes Father     Past Surgical History:  Procedure Laterality Date  . COLONOSCOPY WITH PROPOFOL N/A 04/12/2017   Procedure: COLONOSCOPY WITH PROPOFOL;  Surgeon:  Irene Shipper, MD;  Location: [facility];  Service: Endoscopy;  Laterality: N/A;  . COLONOSCOPY WITH PROPOFOL N/A 04/19/2017   Procedure: COLONOSCOPY WITH PROPOFOL;  Surgeon: Yetta Flock, MD;  [facility];  Service: Gastroenterology;  Laterality: N/A;  . COLOSTOMY  07/20/2011   Procedure: COLOSTOMY;  Surgeon: Judieth Keens, DO;  Location: [facility] OR;  Service: General;;  . COLOSTOMY REVISION  07/20/2011   Procedure: COLON RESECTION SIGMOID;  Surgeon: Judieth Keens, DO;  Location: [place];  Service: General;;  . CYSTOSCOPY N/A 04/04/2013   Procedure: CYSTOSCOPY WITH LITHALOPAXY;  Surgeon: Alexis Frock, MD;  [facility];  Service: Urology;  Laterality: N/A;  . INSERTION OF SUPRAPUBIC CATHETER N/A 04/04/2013   Procedure: INSERTION OF SUPRAPUBIC CATHETER;  Surgeon: Alexis Frock, MD;  [facility];  Service: Urology;  Laterality: N/A;  . LAPAROTOMY  07/20/2011   Procedure: EXPLORATORY LAPAROTOMY;  Surgeon: Judieth Keens, DO;  Location: [place];  Service: General;  Laterality: N/A;  . myeloma thoracic T8 with parpaplegia s/p thoracotomy and thoracic T7-9 cage placement on Dec 26th 2011  07/18/10   Social History   Occupational History  . Not on file  Tobacco Use  . Smoking status: Never Smoker    . Smokeless tobacco: Never Used  Substance and Sexual Activity  . Alcohol use: No  . Drug use: No  . Sexual activity: Never

## 2019-12-09 ENCOUNTER — Telehealth: Payer: Self-pay

## 2019-12-09 ENCOUNTER — Telehealth: Payer: Self-pay | Admitting: Hematology and Oncology

## 2019-12-09 ENCOUNTER — Other Ambulatory Visit: Payer: Self-pay | Admitting: Hematology and Oncology

## 2019-12-09 NOTE — Telephone Encounter (Signed)
I spoke with the patient over the telephone I explained the conflicting expert opinion about whether he should undergo prophylactic surgery to avoid pathological fracture before chemotherapy or just be observed and proceed with treatment The patient feels comfortable with the orthopedic surgeon's opinion to wait and see and hopeful that he will not sustain pathological fracture during chemotherapy In my experience, if you develop pathological fracture during chemotherapy, most patients will not be able to undergo surgery due to ongoing treatment and risk of poor wound healing and immunocompromised state  The patient is extremely upset over the error made in the skeletal survey report that indicated he had bone lesions in the humerus After CT imaging was done, it was found that the lesions were not in his humerus but rather is in his femur He felt that I should be blamed for the errors made although I am not the radiologist who reported on his skeletal survey He said that nobody from radiology department has ever called him to apologize for the mistake made I apologized to him today  In any case, after very long discussion with the patient, I recommend we proceed with chemotherapy as soon as possible He is able to accept the risks of not pursuing prophylactic surgery Given how disappointed the patient and his wife over the whole month last month, I also offer him a second opinion or transfer care to another oncologist He will discuss this further with his wife and will call us back

## 2019-12-09 NOTE — Telephone Encounter (Signed)
I have reviewed recommendations from the orthopedic surgeon I disagree with the recommendation that surgery at this point is optional I have explained to the patient and his wife in the past that the current guidelines recommend prophylactic surgery before chemotherapy because if he develop pathological fracture during chemotherapy, the patient will not be a surgical candidate due to high risk of infection and wound healing while on treatment I attempted to call the patient back again and would like to set up a virtual visit but the wife got very upset about conflicting information I left a voicemail to the patient's phone

## 2019-12-09 NOTE — Telephone Encounter (Signed)
Called and given below message. Wife is upset and states that she will call the office back.

## 2019-12-09 NOTE — Telephone Encounter (Signed)
-----   Message from [name], MD sent at 12/09/2019  7:58 AM EDT ----- Regarding: can you call him and his wife what is his decision?

## 2019-12-11 ENCOUNTER — Telehealth: Payer: Self-pay

## 2019-12-11 NOTE — Telephone Encounter (Signed)
I spoke with Dr. Lorenso Courier and he accepted the transfer I will send him an inbasket msg with patient details

## 2019-12-11 NOTE — Telephone Encounter (Signed)
Called back. He would like to transfer his care to another physician at [facility] at the [facility] location.

## 2019-12-12 ENCOUNTER — Telehealth: Payer: Self-pay | Admitting: Hematology and Oncology

## 2019-12-12 NOTE — Telephone Encounter (Signed)
Scheduled appt per 5/21 sch message - pt wife is aware of appt date and time

## 2019-12-18 ENCOUNTER — Inpatient Hospital Stay: Payer: Medicare (Managed Care)

## 2019-12-18 ENCOUNTER — Other Ambulatory Visit: Payer: Self-pay

## 2019-12-18 ENCOUNTER — Inpatient Hospital Stay: Payer: Medicare (Managed Care) | Attending: Hematology and Oncology | Admitting: Hematology and Oncology

## 2019-12-18 VITALS — BP 111/76 | HR 108 | Temp 97.9°F | Resp 18 | Ht 68.0 in

## 2019-12-18 DIAGNOSIS — Z8739 Personal history of other diseases of the musculoskeletal system and connective tissue: Secondary | ICD-10-CM | POA: Diagnosis not present

## 2019-12-18 DIAGNOSIS — G8220 Paraplegia, unspecified: Secondary | ICD-10-CM | POA: Insufficient documentation

## 2019-12-18 DIAGNOSIS — Z933 Colostomy status: Secondary | ICD-10-CM | POA: Insufficient documentation

## 2019-12-18 DIAGNOSIS — C9002 Multiple myeloma in relapse: Secondary | ICD-10-CM

## 2019-12-18 DIAGNOSIS — Z7901 Long term (current) use of anticoagulants: Secondary | ICD-10-CM | POA: Diagnosis not present

## 2019-12-18 DIAGNOSIS — Z86718 Personal history of other venous thrombosis and embolism: Secondary | ICD-10-CM | POA: Diagnosis not present

## 2019-12-18 DIAGNOSIS — E2740 Unspecified adrenocortical insufficiency: Secondary | ICD-10-CM | POA: Diagnosis not present

## 2019-12-18 DIAGNOSIS — Z79899 Other long term (current) drug therapy: Secondary | ICD-10-CM | POA: Insufficient documentation

## 2019-12-18 DIAGNOSIS — Z7984 Long term (current) use of oral hypoglycemic drugs: Secondary | ICD-10-CM | POA: Diagnosis not present

## 2019-12-18 DIAGNOSIS — E119 Type 2 diabetes mellitus without complications: Secondary | ICD-10-CM | POA: Insufficient documentation

## 2019-12-18 LAB — CBC WITH DIFFERENTIAL (CANCER CENTER ONLY)
Abs Immature Granulocytes: 0.07 10*3/uL (ref 0.00–0.07)
Basophils Absolute: 0 10*3/uL (ref 0.0–0.1)
Basophils Relative: 1 %
Eosinophils Absolute: 0.3 10*3/uL (ref 0.0–0.5)
Eosinophils Relative: 5 %
HCT: 33.6 % — ABNORMAL LOW (ref 39.0–52.0)
Hemoglobin: 11.8 g/dL — ABNORMAL LOW (ref 13.0–17.0)
Immature Granulocytes: 1 %
Lymphocytes Relative: 26 %
Lymphs Abs: 1.8 10*3/uL (ref 0.7–4.0)
MCH: 30.3 pg (ref 26.0–34.0)
MCHC: 35.1 g/dL (ref 30.0–36.0)
MCV: 86.2 fL (ref 80.0–100.0)
Monocytes Absolute: 0.7 10*3/uL (ref 0.1–1.0)
Monocytes Relative: 10 %
Neutro Abs: 3.9 10*3/uL (ref 1.7–7.7)
Neutrophils Relative %: 57 %
Platelet Count: 234 10*3/uL (ref 150–400)
RBC: 3.9 MIL/uL — ABNORMAL LOW (ref 4.22–5.81)
RDW: 16.5 % — ABNORMAL HIGH (ref 11.5–15.5)
WBC Count: 6.8 10*3/uL (ref 4.0–10.5)
nRBC: 0 % (ref 0.0–0.2)

## 2019-12-18 LAB — CMP (CANCER CENTER ONLY)
ALT: 18 U/L (ref 0–44)
AST: 20 U/L (ref 15–41)
Albumin: 3.9 g/dL (ref 3.5–5.0)
Alkaline Phosphatase: 64 U/L (ref 38–126)
Anion gap: 9 (ref 5–15)
BUN: 15 mg/dL (ref 8–23)
CO2: 26 mmol/L (ref 22–32)
Calcium: 9.6 mg/dL (ref 8.9–10.3)
Chloride: 105 mmol/L (ref 98–111)
Creatinine: 0.84 mg/dL (ref 0.61–1.24)
GFR, Est AFR Am: >60 mL/min (ref >60)
GFR, Estimated: >60 mL/min (ref >60)
Glucose, Bld: 86 mg/dL (ref 70–99)
Potassium: 3.9 mmol/L (ref 3.5–5.1)
Sodium: 140 mmol/L (ref 135–145)
Total Bilirubin: 0.7 mg/dL (ref 0.3–1.2)
Total Protein: 9.1 g/dL — ABNORMAL HIGH (ref 6.5–8.1)

## 2019-12-19 LAB — BETA 2 MICROGLOBULIN, SERUM: Beta-2 Microglobulin: 4.1 mg/L — ABNORMAL HIGH (ref 0.6–2.4)

## 2019-12-19 LAB — KAPPA/LAMBDA LIGHT CHAINS
Kappa free light chain: 9.5 mg/L (ref 3.3–19.4)
Kappa, lambda light chain ratio: 0.04 — ABNORMAL LOW (ref 0.26–1.65)
Lambda free light chains: 216.8 mg/L — ABNORMAL HIGH (ref 5.7–26.3)

## 2019-12-19 LAB — LACTATE DEHYDROGENASE: LDH: 101 U/L (ref 98–192)

## 2019-12-20 ENCOUNTER — Encounter: Payer: Self-pay | Admitting: Hematology and Oncology

## 2019-12-20 NOTE — Progress Notes (Signed)
[place] [phone]   [phone]  PROGRESS NOTE  Patient Care Team: [name], MD as PCP - General (Family Medicine) [name], MD as Consulting Physician (Physical Medicine and Rehabilitation) Donia Guiles Lavon Paganini, PA-C as Physician Assistant (Physical Medicine and Rehabilitation) [name], MD as Consulting Physician (Hematology and Oncology)  Hematological/Oncological History # IgG Lambda Multiple Myeloma, Relapsed (ISS Stage II) 1) 06/2010: initial diagnosis of Multiple Myeloma after T8 compression fracture. Treated with Velcade/Revlimid/Dexamethasone and achieved a complete remission 2) Velcade was discontinued in September 2012 and that Revlimid and Decadron were discontinued in March 2013. 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 4) Followed by Dr. Alvy Bimler, last clinic visit 10/09/2019. At that time there was concern for relapse of his multiple myeloma.  5) Patient requested transfer to different provider after misunderstanding regarding imaging studies 6) 12/17/2019: transfer care to Dr. Lorenso Courier   Interval History:  [name] 64 y.o. male with medical history significant for  IgG Lambda Multiple Myeloma who presents for a follow up visit. The patient's last visit was on 10/09/2019 with Dr. Alvy Bimler. In the interim since the last visit [name] has requested transfer of care to another provider.   On exam today require notes that he feels well.  He has not had any issues with shortness of breath, fevers, chills, sweats, nausea, vomiting or diarrhea.  He reports that he is otherwise at his baseline level of health and has not noticed any differences lately.  He reports that he is not having any pain in his hips or in his shoulders at this time.  He currently passes stool into her colostomy bag and his urine into a suprapubic  catheter.  He is not noticed any change in his bowel habits or his urinary habits at this time.  A full 10 point ROS is listed below.  MEDICAL HISTORY:  Past Medical History:  Diagnosis Date  . Adrenal insufficiency (HCC)    on chronic dexamethasone  . Anemia   . Cancer ([place])   . Coagulopathy ([place])    on xeralto/ s/p DVT while on coumadin,  IVC in place  . Diabetes mellitus without complication ([place])    type 2  . Gross hematuria 7/14   post foley cath procedure  . History of blood transfusion 7/14  . Multiple myeloma    thoracic T8 with paraplegia s/p resection- on chemo at visit 10/13/10  . Multiple myeloma   . Multiple myeloma without mention of remission   . Neurogenic bladder   . Neurogenic bowel   . Paraplegia ([place])   . Partial small bowel obstruction ([place]) during dec 2011 admission    SURGICAL HISTORY: Past Surgical History:  Procedure Laterality Date  . COLONOSCOPY WITH PROPOFOL N/A 04/12/2017   Procedure: COLONOSCOPY WITH PROPOFOL;  Surgeon: Irene Shipper, MD;  Location: [facility];  Service: Endoscopy;  Laterality: N/A;  . COLONOSCOPY WITH PROPOFOL N/A 04/19/2017   Procedure: COLONOSCOPY WITH PROPOFOL;  Surgeon: Yetta Flock, MD;  Location: [facility];  Service: Gastroenterology;  Laterality: N/A;  . COLOSTOMY  07/20/2011   Procedure: COLOSTOMY;  Surgeon: Judieth Keens, DO;  Location: [facility] OR;  Service: General;;  . COLOSTOMY REVISION  07/20/2011   Procedure: COLON RESECTION SIGMOID;  Surgeon: Judieth Keens, DO;  Location: [facility];  Service: General;;  . CYSTOSCOPY N/A 04/04/2013   Procedure: CYSTOSCOPY WITH LITHALOPAXY;  Surgeon: Alexis Frock, MD;  Location: [facility];  Service: Urology;  Laterality: N/A;  . INSERTION OF SUPRAPUBIC CATHETER N/A 04/04/2013   Procedure: INSERTION OF SUPRAPUBIC CATHETER;  Surgeon: Alexis Frock, MD;  Location: [facility];  Service: Urology;  Laterality: N/A;  . LAPAROTOMY  07/20/2011   Procedure: EXPLORATORY LAPAROTOMY;   Surgeon: Judieth Keens, DO;  Location: [place];  Service: General;  Laterality: N/A;  . myeloma thoracic T8 with parpaplegia s/p thoracotomy and thoracic T7-9 cage placement on Dec 26th 2011  07/18/10    SOCIAL HISTORY: Social History   Socioeconomic History  . Marital status: Married    Spouse name: Not on file  . Number of children: Not on file  . Years of education: Not on file  . Highest education level: Not on file  Occupational History  . Not on file  Tobacco Use  . Smoking status: Never Smoker  . Smokeless tobacco: Never Used  Substance and Sexual Activity  . Alcohol use: No  . Drug use: No  . Sexual activity: Never  Other Topics Concern  . Not on file  Social History Narrative  . Not on file   Social Determinants of Health   Financial Resource Strain:   . Difficulty of Paying Living Expenses:   Food Insecurity:   . Worried About Charity fundraiser in the Last Year:   . Arboriculturist in the Last Year:   Transportation Needs:   . Film/video editor (Medical):   Marland Kitchen Lack of Transportation (Non-Medical):   Physical Activity:   . Days of Exercise per Week:   . Minutes of Exercise per Session:   Stress:   . Feeling of Stress :   Social Connections:   . Frequency of Communication with Friends and Family:   . Frequency of Social Gatherings with Friends and Family:   . Attends Religious Services:   . Active Member of Clubs or Organizations:   . Attends Archivist Meetings:   Marland Kitchen Marital Status:   Intimate Partner Violence:   . Fear of Current or Ex-Partner:   . Emotionally Abused:   Marland Kitchen Physically Abused:   . Sexually Abused:     FAMILY HISTORY: Family History  Problem Relation Age of Onset  . Cancer Mother   . Ovarian cancer Mother   . Diabetes Father     ALLERGIES:  is allergic to feraheme [ferumoxytol].  MEDICATIONS:  Current Outpatient Medications  Medication Sig Dispense Refill  . baclofen (LIORESAL) 20 MG tablet Take 20 mg by  mouth 2 (two) times daily.    . brinzolamide (AZOPT) 1 % ophthalmic suspension Place 1 drop into both eyes 2 (two) times daily.    . iron polysaccharides (NU-IRON) 150 MG capsule Take 1 capsule (150 mg total) by mouth daily. 30 capsule 0  . latanoprost (XALATAN) 0.005 % ophthalmic solution Place 1 drop into both eyes at bedtime.    . metFORMIN (GLUCOPHAGE) 500 MG tablet Take 500 mg by mouth 2 (two) times daily with a meal.    . rivaroxaban (XARELTO) 10 MG TABS tablet Take 1 tablet (10 mg total) by mouth daily with supper. 30 tablet 9  . Skin Protectants, Misc. (EUCERIN) cream Apply 1 application topically 2 (two) times daily as needed for dry skin.     Marland Kitchen zinc oxide (BALMEX) 11.3 % CREA cream Apply 1 application topically 2 (two) times daily.     No current facility-administered medications for this visit.  REVIEW OF SYSTEMS:   Constitutional: ( - ) fevers, ( - )  chills , ( - ) night sweats Eyes: ( - ) blurriness of vision, ( - ) double vision, ( - ) watery eyes Ears, nose, mouth, throat, and face: ( - ) mucositis, ( - ) sore throat Respiratory: ( - ) cough, ( - ) dyspnea, ( - ) wheezes Cardiovascular: ( - ) palpitation, ( - ) chest discomfort, ( - ) lower extremity swelling Gastrointestinal:  ( - ) nausea, ( - ) heartburn, ( - ) change in bowel habits Skin: ( - ) abnormal skin rashes Lymphatics: ( - ) new lymphadenopathy, ( - ) easy bruising Neurological: ( - ) numbness, ( - ) tingling, ( - ) new weaknesses Behavioral/Psych: ( - ) mood change, ( - ) new changes  All other systems were reviewed with the patient and are negative.  PHYSICAL EXAMINATION: ECOG PERFORMANCE STATUS: paraplegic.   Vitals:   12/18/19 1412  BP: 111/76  Pulse: (!) 108  Resp: 18  Temp: 97.9 F (36.6 C)  SpO2: 100%   There were no vitals filed for this visit.  GENERAL: well appearing middle aged [place] American male alert, no distress and comfortable SKIN: skin color, texture, turgor are normal, no  rashes or significant lesions EYES: conjunctiva are pink and non-injected, sclera clear LUNGS: clear to auscultation and percussion with normal breathing effort HEART: regular rate & rhythm and no murmurs and no lower extremity edema Musculoskeletal: no cyanosis of digits and no clubbing  PSYCH: alert & oriented x 3, fluent speech NEURO: paraplegic, no use of LE bilaterally.   LABORATORY DATA:  I have reviewed the data as listed CBC Latest Ref Rng & Units 12/18/2019 09/24/2019 09/17/2018  WBC 4.0 - 10.5 K/uL 6.8 7.5 11.8(H)  Hemoglobin 13.0 - 17.0 g/dL 11.8(L) 12.0(L) 13.3  Hematocrit 39.0 - 52.0 % 33.6(L) 34.1(L) 38.7(L)  Platelets 150 - 400 K/uL 234 258 380    CMP Latest Ref Rng & Units 12/18/2019 09/24/2019 09/17/2018  Glucose 70 - 99 mg/dL 86 109(H) 76  BUN 8 - 23 mg/dL 15 15 9   Creatinine 0.61 - 1.24 mg/dL 0.84 0.82 0.82  Sodium 135 - 145 mmol/L 140 141 140  Potassium 3.5 - 5.1 mmol/L 3.9 3.6 3.9  Chloride 98 - 111 mmol/L 105 109 104  CO2 22 - 32 mmol/L 26 25 26   Calcium 8.9 - 10.3 mg/dL 9.6 9.0 9.7  Total Protein 6.5 - 8.1 g/dL 9.1(H) 8.3(H) 8.7(H)  Total Bilirubin 0.3 - 1.2 mg/dL 0.7 0.4 0.8  Alkaline Phos 38 - 126 U/L 64 84 79  AST 15 - 41 U/L 20 18 16   ALT 0 - 44 U/L 18 20 15     Lab Results  Component Value Date   MPROTEIN 1.9 (H) 09/24/2019   MPROTEIN 0.7 (H) 09/17/2018   MPROTEIN 0.7 (H) 03/14/2018   Lab Results  Component Value Date   KPAFRELGTCHN 9.5 12/18/2019   KPAFRELGTCHN 11.9 09/24/2019   KPAFRELGTCHN 22.7 (H) 09/17/2018   LAMBDASER 216.8 (H) 12/18/2019   LAMBDASER 158.5 (H) 09/24/2019   LAMBDASER 42.0 (H) 09/17/2018   KAPLAMBRATIO 0.04 (L) 12/18/2019   KAPLAMBRATIO 0.08 (L) 09/24/2019   KAPLAMBRATIO 0.54 09/17/2018    RADIOGRAPHIC STUDIES: CT FEMUR LEFT WO CONTRAST  Result Date: 11/28/2019 CLINICAL DATA:  Bilateral femoral lytic lesions. EXAM: CT OF THE LOWER LEFT EXTREMITY WITHOUT CONTRAST CT OF THE LOWER RIGHT EXTREMITY WITHOUT CONTRAST TECHNIQUE:  Multidetector CT imaging of the lower  left extremity was performed according to the standard protocol. Multidetector CT imaging of the lower right extremity was performed according to the standard protocol. COMPARISON:  None. FINDINGS: Bones/Joint/Cartilage Generalized osteopenia. No fracture or dislocation. Normal alignment. Mild osteoarthritis of the bilateral hips. Mild tricompartmental osteoarthritis of the left knee. Mild tricompartmental osteoarthritis of the right knee. No knee joint effusion. Lytic lesion in the mid left femoral diaphysis with endosteal scalloping. Lytic lesion in the proximal-mid right femoral diaphysis with endosteal scalloping spanning 7 cm in length. Ligaments Ligaments are suboptimally evaluated by CT. Muscles and Tendons Mild diffuse muscle atrophy. No intramuscular fluid collection or hematoma. Soft tissue No fluid collection or hematoma. No soft tissue mass. IMPRESSION: Lytic lesion in the mid left femoral diaphysis with endosteal scalloping. Lytic lesion in the proximal-mid right femoral diaphysis with endosteal scalloping spanning 7 cm in length. Differential considerations include metastatic disease versus multiple myeloma. Electronically Signed   By: Kathreen Devoid   On: 11/28/2019 13:33   CT FEMUR RIGHT WO CONTRAST  Result Date: 11/28/2019 CLINICAL DATA:  Bilateral femoral lytic lesions. EXAM: CT OF THE LOWER LEFT EXTREMITY WITHOUT CONTRAST CT OF THE LOWER RIGHT EXTREMITY WITHOUT CONTRAST TECHNIQUE: Multidetector CT imaging of the lower left extremity was performed according to the standard protocol. Multidetector CT imaging of the lower right extremity was performed according to the standard protocol. COMPARISON:  None. FINDINGS: Bones/Joint/Cartilage Generalized osteopenia. No fracture or dislocation. Normal alignment. Mild osteoarthritis of the bilateral hips. Mild tricompartmental osteoarthritis of the left knee. Mild tricompartmental osteoarthritis of the right knee. No  knee joint effusion. Lytic lesion in the mid left femoral diaphysis with endosteal scalloping. Lytic lesion in the proximal-mid right femoral diaphysis with endosteal scalloping spanning 7 cm in length. Ligaments Ligaments are suboptimally evaluated by CT. Muscles and Tendons Mild diffuse muscle atrophy. No intramuscular fluid collection or hematoma. Soft tissue No fluid collection or hematoma. No soft tissue mass. IMPRESSION: Lytic lesion in the mid left femoral diaphysis with endosteal scalloping. Lytic lesion in the proximal-mid right femoral diaphysis with endosteal scalloping spanning 7 cm in length. Differential considerations include metastatic disease versus multiple myeloma. Electronically Signed   By: Kathreen Devoid   On: 11/28/2019 13:33    ASSESSMENT & PLAN [name] 64 y.o. male with medical history significant for  IgG Lambda Multiple Myeloma who presents for a follow up visit.  After review of the labs, discussion with the patient, and reviewed the imaging his findings are most consistent with a relapsed multiple myeloma.  I am in agreement with Dr. Simeon Craft such as prior plan to start daratumumab, Velcade, and dexamethasone as treatment for relapsed multiple myeloma.  The patient has had excellent success before with treatment of his myeloma with Velcade, Revlimid, and dexamethasone.  We will begin setting up the treatment plan but unfortunately next week is a holiday week and therefore we will schedule chemotherapy to start the week after.  He notes that Fridays are good days for him and therefore we will plan to start on 01/02/2020.  He will be provided supportive medications including acyclovir as well as an albuterol inhaler.  This regimen is not typically associated with nausea or vomiting, however we will provide a Zofran prescription in the event that he does develop some nausea throughout the course.  The patient was provided with the contact information and told to call us if any  additional questions or concerns.  We will plan to see him back prior to the first dose  of therapy and every 2 weeks thereafter.  # IgG Lambda Multiple Myeloma, Relapsed (ISS Stage II) --findings are most consistent with relapsed multiple myeloma. Patient previously successfully treated with Velcade/Rev/Dex --will plan to treat with Daratumumab/Velcade/Dex to start approximately 01/02/2020 --recollect restaging MM labs today to include MM panel, SFLC, and UPEP. Additionally will collect LDH and beta 2 microglobulin.  --will have patient evaluated by dentistry prior to restarting Zometa therapy. --f/u on 01/02/2020 prior to start of treatment.  #History of DVT --He had placement of IVC filter remain on Xarelto. --Due to poor mobility, and lack of bleeding complications, I recommend he remain on Xarelto indefinitely.  # Supportive Care --will provide patient with an albuterol inhaler prior to starting treatment (for use with daratumumab) --acyclovir 47m BID for VZV prophylaxis --zofran [id number] PRN for nausea/vomiting  --Zometa to be considered after dental clearance --assure pretreatment RBC phenotype  Orders Placed This Encounter  Procedures  . CBC with Differential (Cancer Center Only)    Standing Status:   Future    Number of Occurrences:   1    Standing Expiration Date:   12/17/2020  . CMP ([facility])    Standing Status:   Future    Number of Occurrences:   1    Standing Expiration Date:   12/17/2020  . Lactate dehydrogenase (LDH)    Standing Status:   Future    Number of Occurrences:   1    Standing Expiration Date:   12/17/2020  . Multiple Myeloma Panel (SPEP&IFE w/QIG)    Standing Status:   Future    Number of Occurrences:   1    Standing Expiration Date:   12/17/2020  . Kappa/lambda light chains    Standing Status:   Future    Number of Occurrences:   1    Standing Expiration Date:   12/17/2020  . Beta 2 microglobulin    Standing Status:   Future    Number of  Occurrences:   1    Standing Expiration Date:   12/17/2020  . 24-Hr Ur UPEP/UIFE/Light Chains/TP    Standing Status:   Future    Standing Expiration Date:   12/17/2020    All questions were answered. The patient knows to call the clinic with any problems, questions or concerns.  A total of more than 40 minutes were spent on this encounter and over half of that time was spent on counseling and coordination of care as outlined above.   JoLedell PeoplesMD Department of Hematology/Oncology CoL[facility] Cityt WeTaylor Regional Hospitalhone: [phone]: [phone]: [email or web address]  12/20/2019 6:34 PM

## 2019-12-24 LAB — MULTIPLE MYELOMA PANEL, SERUM
Albumin SerPl Elph-Mcnc: 3.9 g/dL (ref 2.9–4.4)
Albumin/Glob SerPl: 0.9 (ref 0.7–1.7)
Alpha 1: 0.2 g/dL (ref 0.0–0.4)
Alpha2 Glob SerPl Elph-Mcnc: 0.9 g/dL (ref 0.4–1.0)
B-Globulin SerPl Elph-Mcnc: 1 g/dL (ref 0.7–1.3)
Gamma Glob SerPl Elph-Mcnc: 2.7 g/dL — ABNORMAL HIGH (ref 0.4–1.8)
Globulin, Total: 4.8 g/dL — ABNORMAL HIGH (ref 2.2–3.9)
IgA: 23 mg/dL — ABNORMAL LOW (ref 61–437)
IgG (Immunoglobin G), Serum: 3629 mg/dL — ABNORMAL HIGH (ref 603–1613)
IgM (Immunoglobulin M), Srm: 8 mg/dL — ABNORMAL LOW (ref 20–172)
M Protein SerPl Elph-Mcnc: 2.5 g/dL — ABNORMAL HIGH
Total Protein ELP: 8.7 g/dL — ABNORMAL HIGH (ref 6.0–8.5)

## 2019-12-29 DIAGNOSIS — Z833 Family history of diabetes mellitus: Secondary | ICD-10-CM | POA: Insufficient documentation

## 2019-12-29 DIAGNOSIS — Z7984 Long term (current) use of oral hypoglycemic drugs: Secondary | ICD-10-CM | POA: Insufficient documentation

## 2019-12-29 DIAGNOSIS — C9002 Multiple myeloma in relapse: Secondary | ICD-10-CM | POA: Diagnosis present

## 2019-12-29 DIAGNOSIS — Z79899 Other long term (current) drug therapy: Secondary | ICD-10-CM | POA: Insufficient documentation

## 2019-12-29 DIAGNOSIS — E860 Dehydration: Secondary | ICD-10-CM | POA: Insufficient documentation

## 2019-12-29 DIAGNOSIS — E2740 Unspecified adrenocortical insufficiency: Secondary | ICD-10-CM | POA: Insufficient documentation

## 2019-12-29 DIAGNOSIS — Z7901 Long term (current) use of anticoagulants: Secondary | ICD-10-CM | POA: Insufficient documentation

## 2019-12-29 DIAGNOSIS — Z5112 Encounter for antineoplastic immunotherapy: Secondary | ICD-10-CM | POA: Diagnosis not present

## 2019-12-29 DIAGNOSIS — R197 Diarrhea, unspecified: Secondary | ICD-10-CM | POA: Diagnosis not present

## 2019-12-29 DIAGNOSIS — Z86718 Personal history of other venous thrombosis and embolism: Secondary | ICD-10-CM | POA: Insufficient documentation

## 2019-12-29 DIAGNOSIS — E119 Type 2 diabetes mellitus without complications: Secondary | ICD-10-CM | POA: Insufficient documentation

## 2019-12-29 DIAGNOSIS — N179 Acute kidney failure, unspecified: Secondary | ICD-10-CM | POA: Diagnosis not present

## 2019-12-29 DIAGNOSIS — D696 Thrombocytopenia, unspecified: Secondary | ICD-10-CM | POA: Insufficient documentation

## 2019-12-29 DIAGNOSIS — Z8041 Family history of malignant neoplasm of ovary: Secondary | ICD-10-CM | POA: Insufficient documentation

## 2019-12-30 ENCOUNTER — Telehealth: Payer: Self-pay | Admitting: *Deleted

## 2019-12-30 ENCOUNTER — Inpatient Hospital Stay: Payer: Medicare (Managed Care)

## 2019-12-30 ENCOUNTER — Other Ambulatory Visit: Payer: Self-pay

## 2019-12-30 ENCOUNTER — Other Ambulatory Visit: Payer: Self-pay | Admitting: *Deleted

## 2019-12-30 ENCOUNTER — Telehealth: Payer: Self-pay | Admitting: Hematology and Oncology

## 2019-12-30 LAB — UPEP/UIFE/LIGHT CHAINS/TP, 24-HR UR
% BETA, Urine: 28.7 %
ALPHA 1 URINE: 1.5 %
Albumin, U: 34.9 %
Alpha 2, Urine: 5.9 %
Free Kappa Lt Chains,Ur: 11.17 mg/L (ref 0.63–113.79)
Free Kappa/Lambda Ratio: 3.53 (ref 1.03–31.76)
Free Lambda Lt Chains,Ur: 3.16 mg/L (ref 0.47–11.77)
GAMMA GLOBULIN URINE: 29 %
M-SPIKE %, Urine: 18.2 % — ABNORMAL HIGH
M-Spike, Mg/24 Hr: 238 mg/24 hr — ABNORMAL HIGH
Total Protein, Urine-Ur/day: 1305 mg/24 hr — ABNORMAL HIGH (ref 30–150)
Total Protein, Urine: 50.2 mg/dL
Total Volume: 2600

## 2019-12-30 MED ORDER — ACYCLOVIR 400 MG PO TABS
400.0000 mg | ORAL_TABLET | Freq: Two times a day (BID) | ORAL | 2 refills | Status: DC
Start: 2019-12-30 — End: 2020-08-18

## 2019-12-30 MED ORDER — ONDANSETRON HCL 8 MG PO TABS
8.0000 mg | ORAL_TABLET | Freq: Three times a day (TID) | ORAL | 0 refills | Status: DC | PRN
Start: 2019-12-30 — End: 2022-07-28

## 2019-12-30 NOTE — Telephone Encounter (Signed)
Received call from patient seeking to confirm his upcoming appts and if anyone would be allowed to come with him.  Reviewed appts with him.  Advised that his wife could come to the chemo education appt and to the MD appts but she would not be able to come back to the infusion room when he had  His chemotherapy.  [name] voiced understanding. Reviewed his medications with him at his request. No other questions or concerns

## 2019-12-30 NOTE — Telephone Encounter (Signed)
Called and spoke with patients wife. Mrs. Kennyth Lose informed me to make sure [facility] is contacted first for all appts due to them being the patients means of transportation. Called [facility] at [phone] and left msg for Mendel Ryder to set up transportation for patients appts. Changed contact info to have [facility] be priority to call per wife's request

## 2020-01-01 ENCOUNTER — Inpatient Hospital Stay: Payer: Medicare (Managed Care)

## 2020-01-05 ENCOUNTER — Other Ambulatory Visit: Payer: Self-pay | Admitting: Hematology and Oncology

## 2020-01-07 ENCOUNTER — Inpatient Hospital Stay: Payer: Medicare (Managed Care) | Attending: Hematology and Oncology

## 2020-01-07 ENCOUNTER — Other Ambulatory Visit: Payer: Self-pay

## 2020-01-08 ENCOUNTER — Other Ambulatory Visit: Payer: Self-pay | Admitting: Hematology and Oncology

## 2020-01-08 DIAGNOSIS — C9002 Multiple myeloma in relapse: Secondary | ICD-10-CM

## 2020-01-08 NOTE — Progress Notes (Signed)
[name] Telephone:([phone]) [phone]   Fax:([phone]) [phone]  PROGRESS NOTE  Patient Care Team: [name], MD as PCP - General (Family Medicine) [name], MD as Consulting Physician (Physical Medicine and Rehabilitation) Donia Guiles Lavon Paganini, PA-C as Physician Assistant (Physical Medicine and Rehabilitation) [name], MD as Consulting Physician (Hematology and Oncology)  Hematological/Oncological History # IgG Lambda Multiple Myeloma, Relapsed (ISS Stage II) 1) 06/2010: initial diagnosis of Multiple Myeloma after T8 compression fracture. Treated with Velcade/Revlimid/Dexamethasone and achieved a complete remission 2) Velcade was discontinued in September 2012 and that Revlimid and Decadron were discontinued in March 2013. 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 4) Followed by Dr. Alvy Bimler, last clinic visit 10/09/2019. At that time there was concern for relapse of his multiple myeloma.  5) Patient requested transfer to different provider after misunderstanding regarding imaging studies 6) 12/17/2019: transfer care to Dr. Lorenso Courier  7) 01/09/2020: Cycle 1 Day 1 of Dara/Velcade/Dex  Interval History:  [name] 64 y.o. male with medical history significant for  IgG Lambda Multiple Myeloma who presents for a follow up visit. The patient's last visit was on 12/17/2019 at which time he established care. [name] is scheduled to start Cycle 1 Day 1 of Dara/Velcade/Dex today.   On exam today [name] notes that he feels well.  He reports that his energy good and his appetite has also been holding out.  He reports that his stools have been on and off with some loose bowel movements occasionally.  He reports he has not had any serious constipation or diarrhea.  He also notes that he is picked up all of his medications from the pharmacy and has his medications for nausea,  as well as the prophylaxis for shingles.  He notes he has been otherwise quite well with no other changes in his energy level.  He denies having any issues with shortness of breath, bleeding, bruising, or dark stools.  A full 10 point ROS is listed below.  MEDICAL HISTORY:  Past Medical History:  Diagnosis Date  . Adrenal insufficiency (HCC)    on chronic dexamethasone  . Anemia   . Cancer ([place])   . Coagulopathy ([place])    on xeralto/ s/p DVT while on coumadin,  IVC in place  . Diabetes mellitus without complication ([place])    type 2  . Gross hematuria 7/14   post foley cath procedure  . History of blood transfusion 7/14  . Multiple myeloma    thoracic T8 with paraplegia s/p resection- on chemo at visit 10/13/10  . Multiple myeloma   . Multiple myeloma without mention of remission   . Neurogenic bladder   . Neurogenic bowel   . Paraplegia ([place])   . Partial small bowel obstruction ([place]) during dec 2011 admission    SURGICAL HISTORY: Past Surgical History:  Procedure Laterality Date  . COLONOSCOPY WITH PROPOFOL N/A 04/12/2017   Procedure: COLONOSCOPY WITH PROPOFOL;  Surgeon: Irene Shipper, MD;  Location: [facility];  Service: Endoscopy;  Laterality: N/A;  . COLONOSCOPY WITH PROPOFOL N/A 04/19/2017   Procedure: COLONOSCOPY WITH PROPOFOL;  Surgeon: Yetta Flock, MD;  Location: [facility];  Service: Gastroenterology;  Laterality: N/A;  . COLOSTOMY  07/20/2011   Procedure: COLOSTOMY;  Surgeon: Judieth Keens, DO;  Location: [facility];  Service: General;;  . COLOSTOMY REVISION  07/20/2011   Procedure: COLON RESECTION SIGMOID;  Surgeon: Aaron Edelman  Billy Fischer, DO;  Location: [facility];  Service: General;;  . CYSTOSCOPY N/A 04/04/2013   Procedure: CYSTOSCOPY WITH LITHALOPAXY;  Surgeon: Alexis Frock, MD;  Location: [facility];  Service: Urology;  Laterality: N/A;  . INSERTION OF SUPRAPUBIC CATHETER N/A 04/04/2013   Procedure: INSERTION OF SUPRAPUBIC CATHETER;  Surgeon: Alexis Frock, MD;   Location: [facility];  Service: Urology;  Laterality: N/A;  . LAPAROTOMY  07/20/2011   Procedure: EXPLORATORY LAPAROTOMY;  Surgeon: Judieth Keens, DO;  Location: [place];  Service: General;  Laterality: N/A;  . myeloma thoracic T8 with parpaplegia s/p thoracotomy and thoracic T7-9 cage placement on Dec 26th 2011  07/18/10    SOCIAL HISTORY: Social History   Socioeconomic History  . Marital status: Married    Spouse name: Not on file  . Number of children: Not on file  . Years of education: Not on file  . Highest education level: Not on file  Occupational History  . Not on file  Tobacco Use  . Smoking status: Never Smoker  . Smokeless tobacco: Never Used  Vaping Use  . Vaping Use: Never used  Substance and Sexual Activity  . Alcohol use: No  . Drug use: No  . Sexual activity: Never  Other Topics Concern  . Not on file  Social History Narrative  . Not on file   Social Determinants of Health   Financial Resource Strain:   . Difficulty of Paying Living Expenses:   Food Insecurity:   . Worried About Charity fundraiser in the Last Year:   . Arboriculturist in the Last Year:   Transportation Needs:   . Film/video editor (Medical):   Marland Kitchen Lack of Transportation (Non-Medical):   Physical Activity:   . Days of Exercise per Week:   . Minutes of Exercise per Session:   Stress:   . Feeling of Stress :   Social Connections:   . Frequency of Communication with Friends and Family:   . Frequency of Social Gatherings with Friends and Family:   . Attends Religious Services:   . Active Member of Clubs or Organizations:   . Attends Archivist Meetings:   Marland Kitchen Marital Status:   Intimate Partner Violence:   . Fear of Current or Ex-Partner:   . Emotionally Abused:   Marland Kitchen Physically Abused:   . Sexually Abused:     FAMILY HISTORY: Family History  Problem Relation Age of Onset  . Cancer Mother   . Ovarian cancer Mother   . Diabetes Father     ALLERGIES:  is allergic to  feraheme [ferumoxytol].  MEDICATIONS:  Current Outpatient Medications  Medication Sig Dispense Refill  . acyclovir (ZOVIRAX) 400 MG tablet Take 1 tablet (400 mg total) by mouth 2 (two) times daily. 60 tablet 2  . baclofen (LIORESAL) 20 MG tablet Take 20 mg by mouth 2 (two) times daily.    . brinzolamide (AZOPT) 1 % ophthalmic suspension Place 1 drop into both eyes 2 (two) times daily.    . dorzolamide (TRUSOPT) 2 % ophthalmic solution 1 drop 2 (two) times daily.    . iron polysaccharides (NU-IRON) 150 MG capsule Take 1 capsule (150 mg total) by mouth daily. 30 capsule 0  . latanoprost (XALATAN) 0.005 % ophthalmic solution Place 1 drop into both eyes at bedtime.    . metFORMIN (GLUCOPHAGE) 500 MG tablet Take 500 mg by mouth 2 (two) times daily with a meal.    . ondansetron (ZOFRAN) 8 MG  tablet Take 1 tablet (8 mg total) by mouth every 8 (eight) hours as needed for nausea or vomiting. 30 tablet 0  . rivaroxaban (XARELTO) 10 MG TABS tablet Take 1 tablet (10 mg total) by mouth daily with supper. 30 tablet 9  . Skin Protectants, Misc. (EUCERIN) cream Apply 1 application topically 2 (two) times daily as needed for dry skin.     Marland Kitchen zinc oxide (BALMEX) 11.3 % CREA cream Apply 1 application topically 2 (two) times daily.     No current facility-administered medications for this visit.    REVIEW OF SYSTEMS:   Constitutional: ( - ) fevers, ( - )  chills , ( - ) night sweats Eyes: ( - ) blurriness of vision, ( - ) double vision, ( - ) watery eyes Ears, nose, mouth, throat, and face: ( - ) mucositis, ( - ) sore throat Respiratory: ( - ) cough, ( - ) dyspnea, ( - ) wheezes Cardiovascular: ( - ) palpitation, ( - ) chest discomfort, ( - ) lower extremity swelling Gastrointestinal:  ( - ) nausea, ( - ) heartburn, ( - ) change in bowel habits Skin: ( - ) abnormal skin rashes Lymphatics: ( - ) new lymphadenopathy, ( - ) easy bruising Neurological: ( - ) numbness, ( - ) tingling, ( - ) new  weaknesses Behavioral/Psych: ( - ) mood change, ( - ) new changes  All other systems were reviewed with the patient and are negative.  PHYSICAL EXAMINATION: ECOG PERFORMANCE STATUS: paraplegic.   Vitals:   01/09/20 1029  BP: 110/73  Pulse: 81  Resp: 18  Temp: 98.1 F (36.7 C)  SpO2: 100%   Filed Weights   01/09/20 1029  Weight: 218 lb 3.2 oz (99 kg)    GENERAL: well appearing middle aged [place] American male alert, no distress and comfortable SKIN: skin color, texture, turgor are normal, no rashes or significant lesions EYES: conjunctiva are pink and non-injected, sclera clear LUNGS: clear to auscultation and percussion with normal breathing effort HEART: regular rate & rhythm and no murmurs and no lower extremity edema Musculoskeletal: no cyanosis of digits and no clubbing  PSYCH: alert & oriented x 3, fluent speech NEURO: paraplegic, no use of LE bilaterally.   LABORATORY DATA:  I have reviewed the data as listed CBC Latest Ref Rng & Units 01/09/2020 12/18/2019 09/24/2019  WBC 4.0 - 10.5 K/uL 6.6 6.8 7.5  Hemoglobin 13.0 - 17.0 g/dL 11.6(L) 11.8(L) 12.0(L)  Hematocrit 39 - 52 % 33.6(L) 33.6(L) 34.1(L)  Platelets 150 - 400 K/uL 238 234 258    CMP Latest Ref Rng & Units 01/09/2020 12/18/2019 09/24/2019  Glucose 70 - 99 mg/dL 89 86 109(H)  BUN 8 - 23 mg/dL 12 15 15   Creatinine 0.61 - 1.24 mg/dL 0.85 0.84 0.82  Sodium 135 - 145 mmol/L 141 140 141  Potassium 3.5 - 5.1 mmol/L 3.7 3.9 3.6  Chloride 98 - 111 mmol/L 106 105 109  CO2 22 - 32 mmol/L 27 26 25   Calcium 8.9 - 10.3 mg/dL 10.0 9.6 9.0  Total Protein 6.5 - 8.1 g/dL 9.2(H) 9.1(H) 8.3(H)  Total Bilirubin 0.3 - 1.2 mg/dL 0.5 0.7 0.4  Alkaline Phos 38 - 126 U/L 83 64 84  AST 15 - 41 U/L 19 20 18   ALT 0 - 44 U/L 17 18 20     Lab Results  Component Value Date   MPROTEIN 2.5 (H) 12/18/2019   MPROTEIN 1.9 (H) 09/24/2019   MPROTEIN 0.7 (H) 09/17/2018  Lab Results  Component Value Date   KPAFRELGTCHN 9.5 12/18/2019    KPAFRELGTCHN 11.9 09/24/2019   KPAFRELGTCHN 22.7 (H) 09/17/2018   LAMBDASER 216.8 (H) 12/18/2019   LAMBDASER 158.5 (H) 09/24/2019   LAMBDASER 42.0 (H) 09/17/2018   KAPLAMBRATIO 3.53 12/29/2019   KAPLAMBRATIO 0.04 (L) 12/18/2019   KAPLAMBRATIO 0.08 (L) 09/24/2019    RADIOGRAPHIC STUDIES: No results found.  ASSESSMENT & PLAN [name] 64 y.o. male with medical history significant for  IgG Lambda Multiple Myeloma who presents for a follow up visit.  After review of the labs, discussion with the patient, and reviewed the imaging his findings are most consistent with a relapsed multiple myeloma.  I am in agreement with Dr. Alvy Bimler as prior plan to start daratumumab, Velcade, and dexamethasone as treatment for his relapsed multiple myeloma.  The patient has had excellent success before with treatment of his myeloma with Velcade, Revlimid, and dexamethasone.  On exam today [name] seems quite well.  He has all the preparatory medications for starting his therapy.  Also he has had no new symptoms develop in the interim since our last visit.  He is clear for starting cycle 1 day one of his daratumumab, Velcade, and Dex.  Treatment regimen consists of bortezomib 1.81m/m2 subq (Day 1 and 8), daratumumab 1800 mg subq (day 1,8 and 15), and dexamethasone 20 mg PO (day 1, 8 and 15). (Alta CorningMed 2016; [id number]  # IgG Lambda Multiple Myeloma, Relapsed (ISS Stage II) --findings are most consistent with relapsed multiple myeloma. Patient previously successfully treated with Velcade/Rev/Dex --will plan to treat with Daratumumab/Velcade/Dex to start today (01/09/2020)  --recollect restaging MM labs monthly to include MM panel, SFLC, and UPEP. Additionally will collect LDH and beta 2 microglobulin.  --will have patient evaluated by dentistry prior to restarting Zometa therapy. --f/u on 01/23/2020 prior to Cycle 1 Day 15  #History of DVT --He had placement of IVC filter remain on Xarelto. --Due to  poor mobility, and lack of bleeding complications, I recommend he remain on Xarelto indefinitely. --caution if Plt count were to drop <50  # Supportive Care --will provide patient with an albuterol inhaler prior to starting treatment (for use with daratumumab) --acyclovir 4043mBID for VZV prophylaxis --zofran 35m49m8H PRN for nausea/vomiting  --Zometa to be considered after dental clearance --assure pretreatment RBC phenotype  No orders of the defined types were placed in this encounter.   All questions were answered. The patient knows to call the clinic with any problems, questions or concerns.  A total of more than 30 minutes were spent on this encounter and over half of that time was spent on counseling and coordination of care as outlined above.   JohLedell PeoplesD Department of Hematology/Oncology [facility]: [phone]: [phone]: [email or web address]  01/09/2020 2:37 PM   Literature Support:  PalArchie Endoeisel K, Nooka AK, Masszi T, BekDalton GardenspiSouth Bound Brook HunBayshore Munder M, MatGreentownark TM, Qi M, Schecter J, AmiScotiain X, Deraedt W, Ahmadi T, Spencer A, Sonneveld P; CASTOR Investigators. Daratumumab, Bortezomib, and Dexamethasone for Multiple Myeloma. N EAlta Corningd. 2016 Aug 25;375(8):754-66  --mong patients with relapsed or relapsed and refractory multiple myeloma, daratumumab in combination with bortezomib and dexamethasone resulted in significantly longer progression-free survival than bortezomib and dexamethasone alone and was associated with infusion-related reactions and higher rates of thrombocytopenia and neutropenia than bortezomib and dexamethasone alone.

## 2020-01-09 ENCOUNTER — Inpatient Hospital Stay: Payer: Medicare (Managed Care)

## 2020-01-09 ENCOUNTER — Inpatient Hospital Stay (HOSPITAL_BASED_OUTPATIENT_CLINIC_OR_DEPARTMENT_OTHER): Payer: Medicare (Managed Care) | Admitting: Hematology and Oncology

## 2020-01-09 ENCOUNTER — Other Ambulatory Visit: Payer: Self-pay

## 2020-01-09 ENCOUNTER — Encounter: Payer: Self-pay | Admitting: Hematology and Oncology

## 2020-01-09 VITALS — BP 110/73 | HR 81 | Temp 98.1°F | Resp 18 | Ht 68.0 in | Wt 218.2 lb

## 2020-01-09 DIAGNOSIS — Z7189 Other specified counseling: Secondary | ICD-10-CM

## 2020-01-09 DIAGNOSIS — G8220 Paraplegia, unspecified: Secondary | ICD-10-CM | POA: Diagnosis not present

## 2020-01-09 DIAGNOSIS — C9002 Multiple myeloma in relapse: Secondary | ICD-10-CM | POA: Diagnosis not present

## 2020-01-09 DIAGNOSIS — Z7901 Long term (current) use of anticoagulants: Secondary | ICD-10-CM | POA: Diagnosis not present

## 2020-01-09 DIAGNOSIS — Z5112 Encounter for antineoplastic immunotherapy: Secondary | ICD-10-CM | POA: Diagnosis not present

## 2020-01-09 LAB — CMP (CANCER CENTER ONLY)
ALT: 17 U/L (ref 0–44)
AST: 19 U/L (ref 15–41)
Albumin: 3.5 g/dL (ref 3.5–5.0)
Alkaline Phosphatase: 83 U/L (ref 38–126)
Anion gap: 8 (ref 5–15)
BUN: 12 mg/dL (ref 8–23)
CO2: 27 mmol/L (ref 22–32)
Calcium: 10 mg/dL (ref 8.9–10.3)
Chloride: 106 mmol/L (ref 98–111)
Creatinine: 0.85 mg/dL (ref 0.61–1.24)
GFR, Est AFR Am: >60 mL/min (ref >60)
GFR, Estimated: >60 mL/min (ref >60)
Glucose, Bld: 89 mg/dL (ref 70–99)
Potassium: 3.7 mmol/L (ref 3.5–5.1)
Sodium: 141 mmol/L (ref 135–145)
Total Bilirubin: 0.5 mg/dL (ref 0.3–1.2)
Total Protein: 9.2 g/dL — ABNORMAL HIGH (ref 6.5–8.1)

## 2020-01-09 LAB — CBC WITH DIFFERENTIAL (CANCER CENTER ONLY)
Abs Immature Granulocytes: 0.09 10*3/uL — ABNORMAL HIGH (ref 0.00–0.07)
Basophils Absolute: 0.1 10*3/uL (ref 0.0–0.1)
Basophils Relative: 1 %
Eosinophils Absolute: 0.3 10*3/uL (ref 0.0–0.5)
Eosinophils Relative: 5 %
HCT: 33.6 % — ABNORMAL LOW (ref 39.0–52.0)
Hemoglobin: 11.6 g/dL — ABNORMAL LOW (ref 13.0–17.0)
Immature Granulocytes: 1 %
Lymphocytes Relative: 27 %
Lymphs Abs: 1.8 10*3/uL (ref 0.7–4.0)
MCH: 29.4 pg (ref 26.0–34.0)
MCHC: 34.5 g/dL (ref 30.0–36.0)
MCV: 85.1 fL (ref 80.0–100.0)
Monocytes Absolute: 0.7 10*3/uL (ref 0.1–1.0)
Monocytes Relative: 10 %
Neutro Abs: 3.7 10*3/uL (ref 1.7–7.7)
Neutrophils Relative %: 56 %
Platelet Count: 238 10*3/uL (ref 150–400)
RBC: 3.95 MIL/uL — ABNORMAL LOW (ref 4.22–5.81)
RDW: 16.4 % — ABNORMAL HIGH (ref 11.5–15.5)
WBC Count: 6.6 10*3/uL (ref 4.0–10.5)
nRBC: 0 % (ref 0.0–0.2)

## 2020-01-09 LAB — TYPE AND SCREEN
ABO/RH(D): O POS
Antibody Screen: NEGATIVE

## 2020-01-09 LAB — PRETREATMENT RBC PHENOTYPE

## 2020-01-09 LAB — LACTATE DEHYDROGENASE: LDH: 106 U/L (ref 98–192)

## 2020-01-09 LAB — ABO/RH: ABO/RH(D): O POS

## 2020-01-09 MED ORDER — DEXAMETHASONE 4 MG PO TABS
20.0000 mg | ORAL_TABLET | Freq: Once | ORAL | Status: AC
  Administered 2020-01-09: 20 mg via ORAL

## 2020-01-09 MED ORDER — BORTEZOMIB CHEMO SQ INJECTION 3.5 MG (2.5MG/ML)
1.3000 mg/m2 | Freq: Once | INTRAMUSCULAR | Status: AC
  Administered 2020-01-09: 2.75 mg via SUBCUTANEOUS
  Filled 2020-01-09: qty 1.1

## 2020-01-09 MED ORDER — ACETAMINOPHEN 325 MG PO TABS
ORAL_TABLET | ORAL | Status: AC
  Filled 2020-01-09: qty 2

## 2020-01-09 MED ORDER — MONTELUKAST SODIUM 10 MG PO TABS
ORAL_TABLET | ORAL | Status: AC
  Filled 2020-01-09: qty 1

## 2020-01-09 MED ORDER — ACETAMINOPHEN 325 MG PO TABS
650.0000 mg | ORAL_TABLET | Freq: Once | ORAL | Status: AC
  Administered 2020-01-09: 650 mg via ORAL

## 2020-01-09 MED ORDER — DIPHENHYDRAMINE HCL 25 MG PO CAPS
ORAL_CAPSULE | ORAL | Status: AC
  Filled 2020-01-09: qty 1

## 2020-01-09 MED ORDER — MONTELUKAST SODIUM 10 MG PO TABS
10.0000 mg | ORAL_TABLET | Freq: Once | ORAL | Status: AC
  Administered 2020-01-09: 10 mg via ORAL

## 2020-01-09 MED ORDER — DEXAMETHASONE 4 MG PO TABS
ORAL_TABLET | ORAL | Status: AC
  Filled 2020-01-09: qty 5

## 2020-01-09 MED ORDER — DIPHENHYDRAMINE HCL 25 MG PO CAPS
25.0000 mg | ORAL_CAPSULE | Freq: Once | ORAL | Status: AC
  Administered 2020-01-09: 25 mg via ORAL

## 2020-01-09 MED ORDER — DARATUMUMAB-HYALURONIDASE-FIHJ 1800-30000 MG-UT/15ML ~~LOC~~ SOLN
1800.0000 mg | Freq: Once | SUBCUTANEOUS | Status: AC
  Administered 2020-01-09: 1800 mg via SUBCUTANEOUS
  Filled 2020-01-09: qty 15

## 2020-01-09 NOTE — Patient Instructions (Addendum)
Bortezomib injection What is this medicine? BORTEZOMIB (bor TEZ oh mib) is a medicine that targets proteins in cancer cells and stops the cancer cells from growing. It is used to treat multiple myeloma and mantle-cell lymphoma. This medicine may be used for other purposes; ask your health care provider or pharmacist if you have questions. COMMON BRAND NAME(S): Velcade What should I tell my health care provider before I take this medicine? They need to know if you have any of these conditions:  diabetes  heart disease  irregular heartbeat  liver disease  on hemodialysis  low blood counts, like low white blood cells, platelets, or hemoglobin  peripheral neuropathy  taking medicine for blood pressure  an unusual or allergic reaction to bortezomib, mannitol, boron, other medicines, foods, dyes, or preservatives  pregnant or trying to get pregnant  breast-feeding How should I use this medicine? This medicine is for injection into a vein or for injection under the skin. It is given by a health care professional in a hospital or clinic setting. Talk to your pediatrician regarding the use of this medicine in children. Special care may be needed. Overdosage: If you think you have taken too much of this medicine contact a poison control center or emergency room at once. NOTE: This medicine is only for you. Do not share this medicine with others. What if I miss a dose? It is important not to miss your dose. Call your doctor or health care professional if you are unable to keep an appointment. What may interact with this medicine? This medicine may interact with the following medications:  ketoconazole  rifampin  ritonavir  St. John's Wort This list may not describe all possible interactions. Give your health care provider a list of all the medicines, herbs, non-prescription drugs, or dietary supplements you use. Also tell them if you smoke, drink alcohol, or use illegal drugs. Some  items may interact with your medicine. What should I watch for while using this medicine? You may get drowsy or dizzy. Do not drive, use machinery, or do anything that needs mental alertness until you know how this medicine affects you. Do not stand or sit up quickly, especially if you are an older patient. This reduces the risk of dizzy or fainting spells. In some cases, you may be given additional medicines to help with side effects. Follow all directions for their use. Call your doctor or health care professional for advice if you get a fever, chills or sore throat, or other symptoms of a cold or flu. Do not treat yourself. This drug decreases your body's ability to fight infections. Try to avoid being around people who are sick. This medicine may increase your risk to bruise or bleed. Call your doctor or health care professional if you notice any unusual bleeding. You may need blood work done while you are taking this medicine. In some patients, this medicine may cause a serious brain infection that may cause death. If you have any problems seeing, thinking, speaking, walking, or standing, tell your doctor right away. If you cannot reach your doctor, urgently seek other source of medical care. Check with your doctor or health care professional if you get an attack of severe diarrhea, nausea and vomiting, or if you sweat a lot. The loss of too much body fluid can make it dangerous for you to take this medicine. Do not become pregnant while taking this medicine or for at least 7 months after stopping it. Women should inform their doctor   if they wish to become pregnant or think they might be pregnant. Men should not father a child while taking this medicine and for at least 4 months after stopping it. There is a potential for serious side effects to an unborn child. Talk to your health care professional or pharmacist for more information. Do not breast-feed an infant while taking this medicine or for 2  months after stopping it. This medicine may interfere with the ability to have a child. You should talk with your doctor or health care professional if you are concerned about your fertility. What side effects may I notice from receiving this medicine? Side effects that you should report to your doctor or health care professional as soon as possible:  allergic reactions like skin rash, itching or hives, swelling of the face, lips, or tongue  breathing problems  changes in hearing  changes in vision  fast, irregular heartbeat  feeling faint or lightheaded, falls  pain, tingling, numbness in the hands or feet  right upper belly pain  seizures  swelling of the ankles, feet, hands  unusual bleeding or bruising  unusually weak or tired  vomiting  yellowing of the eyes or skin Side effects that usually do not require medical attention (report to your doctor or health care professional if they continue or are bothersome):  changes in emotions or moods  constipation  diarrhea  loss of appetite  headache  irritation at site where injected  nausea This list may not describe all possible side effects. Call your doctor for medical advice about side effects. You may report side effects to FDA at 1-800-FDA-1088. Where should I keep my medicine? This drug is given in a hospital or clinic and will not be stored at home. NOTE: This sheet is a summary. It may not cover all possible information. If you have questions about this medicine, talk to your doctor, pharmacist, or health care provider.  2020 Elsevier/Gold Standard (2017-11-19 16:29:31) Daratumumab injection What is this medicine? DARATUMUMAB (dar a toom ue mab) is a monoclonal antibody. It is used to treat multiple myeloma. This medicine may be used for other purposes; ask your health care provider or pharmacist if you have questions. COMMON BRAND NAME(S): DARZALEX What should I tell my health care provider before I  take this medicine? They need to know if you have any of these conditions:  infection (especially a virus infection such as chickenpox, herpes, or hepatitis B virus)  lung or breathing disease  an unusual or allergic reaction to daratumumab, other medicines, foods, dyes, or preservatives  pregnant or trying to get pregnant  breast-feeding How should I use this medicine? This medicine is for infusion into a vein. It is given by a health care professional in a hospital or clinic setting. Talk to your pediatrician regarding the use of this medicine in children. Special care may be needed. Overdosage: If you think you have taken too much of this medicine contact a poison control center or emergency room at once. NOTE: This medicine is only for you. Do not share this medicine with others. What if I miss a dose? Keep appointments for follow-up doses as directed. It is important not to miss your dose. Call your doctor or health care professional if you are unable to keep an appointment. What may interact with this medicine? Interactions have not been studied. This list may not describe all possible interactions. Give your health care provider a list of all the medicines, herbs, non-prescription drugs, or  dietary supplements you use. Also tell them if you smoke, drink alcohol, or use illegal drugs. Some items may interact with your medicine. What should I watch for while using this medicine? This drug may make you feel generally unwell. Report any side effects. Continue your course of treatment even though you feel ill unless your doctor tells you to stop. This medicine can cause serious allergic reactions. To reduce your risk you may need to take medicine before treatment with this medicine. Take your medicine as directed. This medicine can affect the results of blood tests to match your blood type. These changes can last for up to 6 months after the final dose. Your healthcare provider will do  blood tests to match your blood type before you start treatment. Tell all of your healthcare providers that you are being treated with this medicine before receiving a blood transfusion. This medicine can affect the results of some tests used to determine treatment response; extra tests may be needed to evaluate response. Do not become pregnant while taking this medicine or for 3 months after stopping it. Women should inform their doctor if they wish to become pregnant or think they might be pregnant. There is a potential for serious side effects to an unborn child. Talk to your health care professional or pharmacist for more information. What side effects may I notice from receiving this medicine? Side effects that you should report to your doctor or health care professional as soon as possible:  allergic reactions like skin rash, itching or hives, swelling of the face, lips, or tongue  breathing problems  chills  cough  dizziness  feeling faint or lightheaded  headache  low blood counts - this medicine may decrease the number of white blood cells, red blood cells and platelets. You may be at increased risk for infections and bleeding.  nausea, vomiting  shortness of breath  signs of decreased platelets or bleeding - bruising, pinpoint red spots on the skin, black, tarry stools, blood in the urine  signs of decreased red blood cells - unusually weak or tired, feeling faint or lightheaded, falls  signs of infection - fever or chills, cough, sore throat, pain or difficulty passing urine  signs and symptoms of liver injury like dark yellow or brown urine; general ill feeling or flu-like symptoms; light-colored stools; loss of appetite; right upper belly pain; unusually weak or tired; yellowing of the eyes or skin Side effects that usually do not require medical attention (report to your doctor or health care professional if they continue or are bothersome):  back  pain  constipation  diarrhea  joint pain  muscle cramps  pain, tingling, numbness in the hands or feet  swelling of the ankles, feet, hands  tiredness  trouble sleeping This list may not describe all possible side effects. Call your doctor for medical advice about side effects. You may report side effects to FDA at 1-800-FDA-1088. Where should I keep my medicine? This drug is given in a hospital or clinic and will not be stored at home. NOTE: This sheet is a summary. It may not cover all possible information. If you have questions about this medicine, talk to your doctor, pharmacist, or health care provider.  2020 Elsevier/Gold Standard (2019-03-18 18:10:54)

## 2020-01-09 NOTE — Progress Notes (Signed)
First time Velcade and Faspro. Pt received without complaint. Nutrition offered. Pt waited 2hour obs.

## 2020-01-16 ENCOUNTER — Other Ambulatory Visit: Payer: Self-pay

## 2020-01-16 ENCOUNTER — Other Ambulatory Visit: Payer: Self-pay | Admitting: Hematology and Oncology

## 2020-01-16 ENCOUNTER — Inpatient Hospital Stay: Payer: Medicare (Managed Care)

## 2020-01-16 VITALS — BP 111/61 | HR 97 | Temp 98.8°F | Resp 18 | Wt 217.0 lb

## 2020-01-16 DIAGNOSIS — Z7189 Other specified counseling: Secondary | ICD-10-CM

## 2020-01-16 DIAGNOSIS — C9002 Multiple myeloma in relapse: Secondary | ICD-10-CM

## 2020-01-16 DIAGNOSIS — Z5112 Encounter for antineoplastic immunotherapy: Secondary | ICD-10-CM | POA: Diagnosis not present

## 2020-01-16 LAB — CBC WITH DIFFERENTIAL (CANCER CENTER ONLY)
Abs Immature Granulocytes: 0.02 10*3/uL (ref 0.00–0.07)
Basophils Absolute: 0 10*3/uL (ref 0.0–0.1)
Basophils Relative: 1 %
Eosinophils Absolute: 0.1 10*3/uL (ref 0.0–0.5)
Eosinophils Relative: 3 %
HCT: 32.7 % — ABNORMAL LOW (ref 39.0–52.0)
Hemoglobin: 11.4 g/dL — ABNORMAL LOW (ref 13.0–17.0)
Immature Granulocytes: 1 %
Lymphocytes Relative: 19 %
Lymphs Abs: 0.8 10*3/uL (ref 0.7–4.0)
MCH: 30.2 pg (ref 26.0–34.0)
MCHC: 34.9 g/dL (ref 30.0–36.0)
MCV: 86.7 fL (ref 80.0–100.0)
Monocytes Absolute: 0.6 10*3/uL (ref 0.1–1.0)
Monocytes Relative: 14 %
Neutro Abs: 2.7 10*3/uL (ref 1.7–7.7)
Neutrophils Relative %: 62 %
Platelet Count: 198 10*3/uL (ref 150–400)
RBC: 3.77 MIL/uL — ABNORMAL LOW (ref 4.22–5.81)
RDW: 16.6 % — ABNORMAL HIGH (ref 11.5–15.5)
WBC Count: 4.1 10*3/uL (ref 4.0–10.5)
nRBC: 0 % (ref 0.0–0.2)

## 2020-01-16 LAB — CMP (CANCER CENTER ONLY)
ALT: 19 U/L (ref 0–44)
AST: 18 U/L (ref 15–41)
Albumin: 3.4 g/dL — ABNORMAL LOW (ref 3.5–5.0)
Alkaline Phosphatase: 80 U/L (ref 38–126)
Anion gap: 11 (ref 5–15)
BUN: 9 mg/dL (ref 8–23)
CO2: 23 mmol/L (ref 22–32)
Calcium: 9.2 mg/dL (ref 8.9–10.3)
Chloride: 106 mmol/L (ref 98–111)
Creatinine: 0.86 mg/dL (ref 0.61–1.24)
GFR, Est AFR Am: >60 mL/min (ref >60)
GFR, Estimated: >60 mL/min (ref >60)
Glucose, Bld: 119 mg/dL — ABNORMAL HIGH (ref 70–99)
Potassium: 3.7 mmol/L (ref 3.5–5.1)
Sodium: 140 mmol/L (ref 135–145)
Total Bilirubin: 0.2 mg/dL — ABNORMAL LOW (ref 0.3–1.2)
Total Protein: 8.4 g/dL — ABNORMAL HIGH (ref 6.5–8.1)

## 2020-01-16 LAB — LACTATE DEHYDROGENASE: LDH: 107 U/L (ref 98–192)

## 2020-01-16 MED ORDER — DEXAMETHASONE 4 MG PO TABS
ORAL_TABLET | ORAL | Status: AC
  Filled 2020-01-16: qty 5

## 2020-01-16 MED ORDER — ACETAMINOPHEN 325 MG PO TABS
650.0000 mg | ORAL_TABLET | Freq: Once | ORAL | Status: AC
  Administered 2020-01-16: 650 mg via ORAL

## 2020-01-16 MED ORDER — MONTELUKAST SODIUM 10 MG PO TABS
ORAL_TABLET | ORAL | Status: AC
  Filled 2020-01-16: qty 1

## 2020-01-16 MED ORDER — ACETAMINOPHEN 325 MG PO TABS
ORAL_TABLET | ORAL | Status: AC
  Filled 2020-01-16: qty 2

## 2020-01-16 MED ORDER — MONTELUKAST SODIUM 10 MG PO TABS
10.0000 mg | ORAL_TABLET | Freq: Once | ORAL | Status: AC
  Administered 2020-01-16: 10 mg via ORAL

## 2020-01-16 MED ORDER — DARATUMUMAB-HYALURONIDASE-FIHJ 1800-30000 MG-UT/15ML ~~LOC~~ SOLN
1800.0000 mg | Freq: Once | SUBCUTANEOUS | Status: AC
  Administered 2020-01-16: 1800 mg via SUBCUTANEOUS
  Filled 2020-01-16: qty 15

## 2020-01-16 MED ORDER — DIPHENHYDRAMINE HCL 25 MG PO CAPS
25.0000 mg | ORAL_CAPSULE | Freq: Once | ORAL | Status: AC
  Administered 2020-01-16: 25 mg via ORAL

## 2020-01-16 MED ORDER — BORTEZOMIB CHEMO SQ INJECTION 3.5 MG (2.5MG/ML)
1.3000 mg/m2 | Freq: Once | INTRAMUSCULAR | Status: AC
  Administered 2020-01-16: 2.75 mg via SUBCUTANEOUS
  Filled 2020-01-16: qty 1.1

## 2020-01-16 MED ORDER — DIPHENHYDRAMINE HCL 25 MG PO CAPS
ORAL_CAPSULE | ORAL | Status: AC
  Filled 2020-01-16: qty 1

## 2020-01-16 MED ORDER — DEXAMETHASONE 4 MG PO TABS
20.0000 mg | ORAL_TABLET | Freq: Once | ORAL | Status: AC
  Administered 2020-01-16: 20 mg via ORAL

## 2020-01-16 NOTE — Patient Instructions (Signed)
[facility] Discharge Instructions for Patients Receiving Chemotherapy  Today you received the following chemotherapy agents: darzalex faspro, velcade  To help prevent nausea and vomiting after your treatment, we encourage you to take your nausea medication   If you develop nausea and vomiting that is not controlled by your nausea medication, call the clinic.   BELOW ARE SYMPTOMS THAT SHOULD BE REPORTED IMMEDIATELY:  *FEVER GREATER THAN 100.5 F  *CHILLS WITH OR WITHOUT FEVER  NAUSEA AND VOMITING THAT IS NOT CONTROLLED WITH YOUR NAUSEA MEDICATION  *UNUSUAL SHORTNESS OF BREATH  *UNUSUAL BRUISING OR BLEEDING  TENDERNESS IN MOUTH AND THROAT WITH OR WITHOUT PRESENCE OF ULCERS  *URINARY PROBLEMS  *BOWEL PROBLEMS  UNUSUAL RASH Items with * indicate a potential emergency and should be followed up as soon as possible.  Feel free to call the clinic should you have any questions or concerns. The clinic phone number is [phone].  Please show the [facility] at check-in to the Emergency Department and triage nurse.

## 2020-01-20 ENCOUNTER — Encounter: Payer: Self-pay | Admitting: Hematology and Oncology

## 2020-01-20 ENCOUNTER — Telehealth: Payer: Self-pay

## 2020-01-20 ENCOUNTER — Telehealth: Payer: Self-pay | Admitting: Medical

## 2020-01-20 NOTE — Telephone Encounter (Signed)
Received voicemail fromDr Barney Drain stating that patient [name] ([birth year]) has had relapse of multiple myeloma, he also received his first chemo on Friday 01/16/20, he has had multiple days of low grade fever. Dr Bradd Burner would like to further discuss with Dr Lorenso Courier what he is seeing and wants to discuss how to approach this situation. Sent staff message to Dr Lorenso Courier and cc: his nurse Beth RN to make them both aware of situation.  Dr. Etheleen Sia number is # [phone]

## 2020-01-20 NOTE — Progress Notes (Signed)
Received call from patient's spouse regarding applying for [employer].  Based on verbal income guidelines, their income exceeds the guidelines for the grant. She was very Patent attorney. Advised I would research if any other assistance was available and if available apply on his behalf with her permission. She gave me permission.  Applied online through [employer] for Urgent Need Adult Assistance. Patient approved for one-time annual stipend of $500. A check will be received by them in 5-7 business days. A copy of the approval letter will be sent to them via mail. Patient may reapply if funds are available after 01/19/21 for the next year.  Called spouse to advise. She was very Patent attorney.

## 2020-01-20 NOTE — Telephone Encounter (Signed)
Returned TC to Dr Bradd Burner and let him know that Dr Lorenso Courier is not in today. He did want to speak with someone in regard to patients current status. Forwarded him to [place] PA to speak with him about patients current symptoms and low grade fevers.

## 2020-01-20 NOTE — Telephone Encounter (Signed)
Spoke with Dr. Barney Drain today. [name] has been having low grade fevers 100.6 or lower. He would like to talk with Dr. Lorenso Courier. He was drawing a CBC to assess for neutropenia. Please see prior phone messages.  Sandi Mealy

## 2020-01-20 NOTE — Progress Notes (Signed)
Applied for Patient Aid Program stipend through [employer] as well.  Patient approved for a one-time $100 stipend to help offset expenses. A copy of the approval letter will be sent in mail. A check will be sent in 7-10 business days.

## 2020-01-21 ENCOUNTER — Other Ambulatory Visit: Payer: Self-pay | Admitting: *Deleted

## 2020-01-21 ENCOUNTER — Encounter: Payer: Self-pay | Admitting: Hematology and Oncology

## 2020-01-21 ENCOUNTER — Other Ambulatory Visit: Payer: Self-pay

## 2020-01-21 ENCOUNTER — Inpatient Hospital Stay: Payer: Medicare (Managed Care)

## 2020-01-21 ENCOUNTER — Inpatient Hospital Stay (HOSPITAL_BASED_OUTPATIENT_CLINIC_OR_DEPARTMENT_OTHER): Payer: Medicare (Managed Care) | Admitting: Hematology and Oncology

## 2020-01-21 ENCOUNTER — Telehealth: Payer: Self-pay | Admitting: *Deleted

## 2020-01-21 VITALS — BP 88/65 | HR 88 | Temp 98.2°F | Resp 18 | Ht 68.0 in

## 2020-01-21 DIAGNOSIS — Z5112 Encounter for antineoplastic immunotherapy: Secondary | ICD-10-CM | POA: Diagnosis not present

## 2020-01-21 DIAGNOSIS — C9002 Multiple myeloma in relapse: Secondary | ICD-10-CM

## 2020-01-21 DIAGNOSIS — R197 Diarrhea, unspecified: Secondary | ICD-10-CM | POA: Diagnosis not present

## 2020-01-21 LAB — CMP (CANCER CENTER ONLY)
ALT: 48 U/L — ABNORMAL HIGH (ref 0–44)
AST: 98 U/L — ABNORMAL HIGH (ref 15–41)
Albumin: 2.9 g/dL — ABNORMAL LOW (ref 3.5–5.0)
Alkaline Phosphatase: 76 U/L (ref 38–126)
Anion gap: 11 (ref 5–15)
BUN: 21 mg/dL (ref 8–23)
CO2: 21 mmol/L — ABNORMAL LOW (ref 22–32)
Calcium: 8.5 mg/dL — ABNORMAL LOW (ref 8.9–10.3)
Chloride: 103 mmol/L (ref 98–111)
Creatinine: 1.27 mg/dL — ABNORMAL HIGH (ref 0.61–1.24)
GFR, Est AFR Am: >60 mL/min (ref >60)
GFR, Estimated: 59 mL/min — ABNORMAL LOW (ref >60)
Glucose, Bld: 98 mg/dL (ref 70–99)
Potassium: 4 mmol/L (ref 3.5–5.1)
Sodium: 135 mmol/L (ref 135–145)
Total Bilirubin: 0.3 mg/dL (ref 0.3–1.2)
Total Protein: 7.5 g/dL (ref 6.5–8.1)

## 2020-01-21 LAB — CBC WITH DIFFERENTIAL (CANCER CENTER ONLY)
Abs Immature Granulocytes: 0 10*3/uL (ref 0.00–0.07)
Basophils Absolute: 0 10*3/uL (ref 0.0–0.1)
Basophils Relative: 0 %
Eosinophils Absolute: 0 10*3/uL (ref 0.0–0.5)
Eosinophils Relative: 0 %
HCT: 30.5 % — ABNORMAL LOW (ref 39.0–52.0)
Hemoglobin: 10.7 g/dL — ABNORMAL LOW (ref 13.0–17.0)
Immature Granulocytes: 0 %
Lymphocytes Relative: 22 %
Lymphs Abs: 0.4 10*3/uL — ABNORMAL LOW (ref 0.7–4.0)
MCH: 30.1 pg (ref 26.0–34.0)
MCHC: 35.1 g/dL (ref 30.0–36.0)
MCV: 85.7 fL (ref 80.0–100.0)
Monocytes Absolute: 0.1 10*3/uL (ref 0.1–1.0)
Monocytes Relative: 6 %
Neutro Abs: 1.3 10*3/uL — ABNORMAL LOW (ref 1.7–7.7)
Neutrophils Relative %: 72 %
Platelet Count: 114 10*3/uL — ABNORMAL LOW (ref 150–400)
RBC: 3.56 MIL/uL — ABNORMAL LOW (ref 4.22–5.81)
RDW: 16.4 % — ABNORMAL HIGH (ref 11.5–15.5)
WBC Count: 1.8 10*3/uL — ABNORMAL LOW (ref 4.0–10.5)
nRBC: 0 % (ref 0.0–0.2)

## 2020-01-21 LAB — C DIFFICILE QUICK SCREEN W PCR REFLEX
C Diff antigen: NEGATIVE
C Diff interpretation: NOT DETECTED
C Diff toxin: NEGATIVE

## 2020-01-21 LAB — MAGNESIUM: Magnesium: 1.8 mg/dL (ref 1.7–2.4)

## 2020-01-21 LAB — URINALYSIS, COMPLETE (UACMP) WITH MICROSCOPIC
Glucose, UA: NEGATIVE mg/dL
Ketones, ur: NEGATIVE mg/dL
Nitrite: NEGATIVE
Protein, ur: >=300 mg/dL — AB
RBC / HPF: >50 RBC/hpf — ABNORMAL HIGH (ref 0–5)
Specific Gravity, Urine: 1.012 (ref 1.005–1.030)
WBC, UA: >50 WBC/hpf — ABNORMAL HIGH (ref 0–5)
pH: 9 — ABNORMAL HIGH (ref 5.0–8.0)

## 2020-01-21 MED ORDER — SODIUM CHLORIDE 0.9 % IV SOLN
Freq: Once | INTRAVENOUS | Status: AC
  Filled 2020-01-21: qty 250

## 2020-01-21 NOTE — Telephone Encounter (Signed)
TCT patinet. Spoke with him. Advised that stool specimen tested negative for C. Diff and that patient can start taking Imodium for the watery diarrhea he is having. Spoke with his wife as well .Provided instructions on use of Imodium. She voiced understanding. And will go to drug tore to pick this up

## 2020-01-21 NOTE — Progress Notes (Signed)
[facility] [phone]   [phone]  PROGRESS NOTE  Patient Care Team: [name], MD as PCP - General (Family Medicine) [name], MD as Consulting Physician (Physical Medicine and Rehabilitation) Donia Guiles Lavon Paganini, PA-C as Physician Assistant (Physical Medicine and Rehabilitation) [name], MD as Consulting Physician (Hematology and Oncology)  Hematological/Oncological History # IgG Lambda Multiple Myeloma, Relapsed (ISS Stage II) 1) 06/2010: initial diagnosis of Multiple Myeloma after T8 compression fracture. Treated with Velcade/Revlimid/Dexamethasone and achieved a complete remission 2) Velcade was discontinued in September 2012 and that Revlimid and Decadron were discontinued in March 2013. 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 4) Followed by Dr. Alvy Bimler, last clinic visit 10/09/2019. At that time there was concern for relapse of his multiple myeloma.  5) Patient requested transfer to different provider after misunderstanding regarding imaging studies 6) 12/17/2019: transfer care to Dr. Lorenso Courier  7) 01/09/2020: Cycle 1 Day 1 of Dara/Velcade/Dex 8) 01/21/2020: presented as urgent visit for diarrhea and dehydration. Holding chemotherapy scheduled for 01/23/2020.  Interval History:  [name] 64 y.o. male with medical history significant for  IgG Lambda Multiple Myeloma who presents for a follow up visit. The patient's last visit was on 01/09/2020 prior to the start Cycle 1 Day 1 of Dara/Velcade/Dex.   On exam today [name] notes he has been having increased colostomy output, increased urine output, and general feelings of being unwell for the last 3 days.  He reports he has been taking his temperature and has measured at at a low-grade of less than 100.4 Fahrenheit.  He reports that he has not had any nausea or vomiting, but that  his appetite has been quite poor.  He notes his ostomy output is normal in color with no evidence of blood though it has been copious and pours out shortly after eating or drinking.  He has had low energy levels and an increase in his shortness of breath.  He currently denies having any issues with sweats, vomiting, abdominal pain, or other infectious symptoms.  A full 10 point ROS is listed below.  MEDICAL HISTORY:  Past Medical History:  Diagnosis Date  . Adrenal insufficiency (HCC)    on chronic dexamethasone  . Anemia   . Cancer ([place])   . Coagulopathy ([place])    on xeralto/ s/p DVT while on coumadin,  IVC in place  . Diabetes mellitus without complication ([place])    type 2  . Gross hematuria 7/14   post foley cath procedure  . History of blood transfusion 7/14  . Multiple myeloma    thoracic T8 with paraplegia s/p resection- on chemo at visit 10/13/10  . Multiple myeloma   . Multiple myeloma without mention of remission   . Neurogenic bladder   . Neurogenic bowel   . Paraplegia ([place])   . Partial small bowel obstruction ([place]) during dec 2011 admission    SURGICAL HISTORY: Past Surgical History:  Procedure Laterality Date  . COLONOSCOPY WITH PROPOFOL N/A 04/12/2017   Procedure: COLONOSCOPY WITH PROPOFOL;  Surgeon: Irene Shipper, MD;  Location: [facility];  Service: Endoscopy;  Laterality: N/A;  . COLONOSCOPY WITH PROPOFOL N/A 04/19/2017   Procedure: COLONOSCOPY WITH PROPOFOL;  Surgeon: Yetta Flock, MD;  Location: [facility];  Service: Gastroenterology;  Laterality: N/A;  . COLOSTOMY  07/20/2011   Procedure: COLOSTOMY;  Surgeon: Judieth Keens, DO;  Location: [facility];  Service: General;;  . COLOSTOMY REVISION  07/20/2011   Procedure: COLON RESECTION SIGMOID;  Surgeon: Judieth Keens, DO;  Location: [facility];  Service: General;;  . CYSTOSCOPY N/A 04/04/2013   Procedure: CYSTOSCOPY WITH LITHALOPAXY;  Surgeon: Alexis Frock, MD;  Location: [facility];  Service: Urology;   Laterality: N/A;  . INSERTION OF SUPRAPUBIC CATHETER N/A 04/04/2013   Procedure: INSERTION OF SUPRAPUBIC CATHETER;  Surgeon: Alexis Frock, MD;  Location: [facility];  Service: Urology;  Laterality: N/A;  . LAPAROTOMY  07/20/2011   Procedure: EXPLORATORY LAPAROTOMY;  Surgeon: Judieth Keens, DO;  Location: [facility];  Service: General;  Laterality: N/A;  . myeloma thoracic T8 with parpaplegia s/p thoracotomy and thoracic T7-9 cage placement on Dec 26th 2011  07/18/10    SOCIAL HISTORY: Social History   Socioeconomic History  . Marital status: Married    Spouse name: Not on file  . Number of children: Not on file  . Years of education: Not on file  . Highest education level: Not on file  Occupational History  . Not on file  Tobacco Use  . Smoking status: Never Smoker  . Smokeless tobacco: Never Used  Vaping Use  . Vaping Use: Never used  Substance and Sexual Activity  . Alcohol use: No  . Drug use: No  . Sexual activity: Never  Other Topics Concern  . Not on file  Social History Narrative  . Not on file   Social Determinants of Health   Financial Resource Strain:   . Difficulty of Paying Living Expenses:   Food Insecurity:   . Worried About Charity fundraiser in the Last Year:   . Arboriculturist in the Last Year:   Transportation Needs:   . Film/video editor (Medical):   Marland Kitchen Lack of Transportation (Non-Medical):   Physical Activity:   . Days of Exercise per Week:   . Minutes of Exercise per Session:   Stress:   . Feeling of Stress :   Social Connections:   . Frequency of Communication with Friends and Family:   . Frequency of Social Gatherings with Friends and Family:   . Attends Religious Services:   . Active Member of Clubs or Organizations:   . Attends Archivist Meetings:   Marland Kitchen Marital Status:   Intimate Partner Violence:   . Fear of Current or Ex-Partner:   . Emotionally Abused:   Marland Kitchen Physically Abused:   . Sexually Abused:     FAMILY HISTORY:  Family History  Problem Relation Age of Onset  . Cancer Mother   . Ovarian cancer Mother   . Diabetes Father     ALLERGIES:  is allergic to feraheme [ferumoxytol].  MEDICATIONS:  Current Outpatient Medications  Medication Sig Dispense Refill  . acyclovir (ZOVIRAX) 400 MG tablet Take 1 tablet (400 mg total) by mouth 2 (two) times daily. 60 tablet 2  . baclofen (LIORESAL) 20 MG tablet Take 20 mg by mouth 2 (two) times daily.    . brinzolamide (AZOPT) 1 % ophthalmic suspension Place 1 drop into both eyes 2 (two) times daily.    . dorzolamide (TRUSOPT) 2 % ophthalmic solution 1 drop 2 (two) times daily.    . iron polysaccharides (NU-IRON) 150 MG capsule Take 1 capsule (150 mg total) by mouth daily. 30 capsule 0  . latanoprost (XALATAN) 0.005 % ophthalmic solution Place 1 drop into both eyes at bedtime.    . metFORMIN (GLUCOPHAGE) 500 MG tablet Take 500 mg  by mouth 2 (two) times daily with a meal.    . ondansetron (ZOFRAN) 8 MG tablet Take 1 tablet (8 mg total) by mouth every 8 (eight) hours as needed for nausea or vomiting. 30 tablet 0  . rivaroxaban (XARELTO) 10 MG TABS tablet Take 1 tablet (10 mg total) by mouth daily with supper. 30 tablet 9  . Skin Protectants, Misc. (EUCERIN) cream Apply 1 application topically 2 (two) times daily as needed for dry skin.     Marland Kitchen zinc oxide (BALMEX) 11.3 % CREA cream Apply 1 application topically 2 (two) times daily.     No current facility-administered medications for this visit.    REVIEW OF SYSTEMS:   Constitutional: ( - ) fevers, ( - )  chills , ( - ) night sweats Eyes: ( - ) blurriness of vision, ( - ) double vision, ( - ) watery eyes Ears, nose, mouth, throat, and face: ( - ) mucositis, ( - ) sore throat Respiratory: ( - ) cough, ( - ) dyspnea, ( - ) wheezes Cardiovascular: ( - ) palpitation, ( - ) chest discomfort, ( - ) lower extremity swelling Gastrointestinal:  ( - ) nausea, ( - ) heartburn, ( - ) change in bowel habits Skin: ( - ) abnormal  skin rashes Lymphatics: ( - ) new lymphadenopathy, ( - ) easy bruising Neurological: ( - ) numbness, ( - ) tingling, ( - ) new weaknesses Behavioral/Psych: ( - ) mood change, ( - ) new changes  All other systems were reviewed with the patient and are negative.  PHYSICAL EXAMINATION: ECOG PERFORMANCE STATUS: paraplegic.   Vitals:   01/21/20 1301  BP: (!) 88/65  Pulse: 88  Resp: 18  Temp: 98.2 F (36.8 C)  SpO2: 100%   There were no vitals filed for this visit.  GENERAL: well appearing middle aged [place] American male alert, no distress and comfortable SKIN: skin color, texture, turgor are normal, no rashes or significant lesions EYES: conjunctiva are pink and non-injected, sclera clear LUNGS: clear to auscultation and percussion with normal breathing effort HEART: regular rate & rhythm and no murmurs and no lower extremity edema Musculoskeletal: no cyanosis of digits and no clubbing  PSYCH: alert & oriented x 3, fluent speech NEURO: paraplegic, no use of LE bilaterally.   LABORATORY DATA:  I have reviewed the data as listed CBC Latest Ref Rng & Units 01/21/2020 01/16/2020 01/09/2020  WBC 4.0 - 10.5 K/uL 1.8(L) 4.1 6.6  Hemoglobin 13.0 - 17.0 g/dL 10.7(L) 11.4(L) 11.6(L)  Hematocrit 39 - 52 % 30.5(L) 32.7(L) 33.6(L)  Platelets 150 - 400 K/uL 114(L) 198 238    CMP Latest Ref Rng & Units 01/21/2020 01/16/2020 01/09/2020  Glucose 70 - 99 mg/dL 98 119(H) 89  BUN 8 - 23 mg/dL _0 Creatinine 0.61 - 1.24 mg/dL 1.27(H) 0.86 0.85  Sodium 135 - 145 mmol/L 135 140 141  Potassium 3.5 - 5.1 mmol/L 4.0 3.7 3.7  Chloride 98 - 111 mmol/L 103 106 106  CO2 22 - 32 mmol/L 21(L) 23 27  Calcium 8.9 - 10.3 mg/dL 8.5(L) 9.2 10.0  Total Protein 6.5 - 8.1 g/dL 7.5 8.4(H) 9.2(H)  Total Bilirubin 0.3 - 1.2 mg/dL 0.3 0.2(L) 0.5  Alkaline Phos 38 - 126 U/L 76 80 83  AST 15 - 41 U/L 98(H) 18 19  ALT 0 - 44 U/L 48(H) 19 17    Lab Results  Component Value Date   MPROTEIN 2.5 (H) 12/18/2019  MPROTEIN 1.9 (H) 09/24/2019   MPROTEIN 0.7 (H) 09/17/2018   Lab Results  Component Value Date   KPAFRELGTCHN 9.5 12/18/2019   KPAFRELGTCHN 11.9 09/24/2019   KPAFRELGTCHN 22.7 (H) 09/17/2018   LAMBDASER 216.8 (H) 12/18/2019   LAMBDASER 158.5 (H) 09/24/2019   LAMBDASER 42.0 (H) 09/17/2018   KAPLAMBRATIO 3.53 12/29/2019   KAPLAMBRATIO 0.04 (L) 12/18/2019   KAPLAMBRATIO 0.08 (L) 09/24/2019    RADIOGRAPHIC STUDIES: No results found.  ASSESSMENT & PLAN [name] 64 y.o. male with medical history significant for  IgG Lambda Multiple Myeloma who presents for a follow up visit.  After review of the labs, discussion with the patient, and reviewed the imaging his findings are most consistent with a relapsed multiple myeloma.  I am in agreement with Dr. Alvy Bimler as prior plan to start daratumumab, Velcade, and dexamethasone as treatment for his relapsed multiple myeloma.  The patient has had excellent success before with treatment of his myeloma with Velcade, Revlimid, and dexamethasone.  On exam today [name] appears uncomfortable.  He has had watery diarrhea with decreased p.o. intake and low-grade temperatures.  His symptoms are either secondary to a GI infection versus chemotherapy side effect.  Given his constellation of symptoms at this time I would favor GI side effect of chemotherapy and therefore will order C. difficile in order to effectively rule out infection.  Once the patient has been shown not to have infection that we can prescribe loperamide to help slow down his bowel movements.  We will plan to hold chemotherapy on 01/23/2020 the patient return to clinic on 01/29/2021 for his day 1 of cycle 2.  In the interim we will check with the patient over the telephone to see if his symptoms are improving.  Treatment regimen consists of bortezomib 1.107m/m2 subq (Day 1 and 8), daratumumab 1800 mg subq (day 1,8 and 15), and dexamethasone 20 mg PO (day 1, 8 and 15). (Alta CorningMed 2016;  [id number]  #Watery Diarrhea #Dehydration #AKI --presented with increased watery ostomy output x 3 days with low grade temperatures (below 100.4 F) --will provide 1 L of IV NS in clinic today. Encourage increased PO intake of fluids --will check C. Diff in clinic today. If negative, patient can begin loperamide 233mq2H PRN for diarrhea (OTC medication) --we hold chemotherapy this week, plan to have clinic visit with the patient next week to reassess.   # IgG Lambda Multiple Myeloma, Relapsed (ISS Stage II) --findings are most consistent with relapsed multiple myeloma. Patient previously successfully treated with Velcade/Rev/Dex --current plan is Daratumumab/Velcade/Dex  ( started 01/09/2020)  --recollect restaging MM labs monthly to include MM panel, SFLC, and UPEP. Additionally will collect LDH and beta 2 microglobulin.  --will have patient evaluated by dentistry prior to restarting Zometa therapy. --f/u on 01/30/2020 prior to Cycle 2 Day 1  #History of DVT --He had placement of IVC filter, remains on Xarelto. --Due to poor mobility, and lack of bleeding complications, I recommend he remain on Xarelto indefinitely. --caution if Plt count were to drop <50  # Supportive Care -- provided patient with an albuterol inhaler (for use with daratumumab) --acyclovir 40063mID for VZV prophylaxis --zofran 8mg72mH PRN for nausea/vomiting  --Zometa to be considered after dental clearance --patient recieved pretreatment RBC phenotype  Orders Placed This Encounter  Procedures  . C Difficile Quick Screen w PCR reflex  . Magnesium    Standing Status:   Future    Number of Occurrences:   1  Standing Expiration Date:   01/20/2021  . Clostridium difficile culture-fecal    All questions were answered. The patient knows to call the clinic with any problems, questions or concerns.  A total of more than 30 minutes were spent on this encounter and over half of that time was spent on counseling and  coordination of care as outlined above.   Ledell Peoples, MD Department of Hematology/Oncology [facility] Phone: [phone] Pager: [phone] Email: [email or web address]  01/21/2020 4:25 PM   Literature Support:  Archie Endo, Weisel K, Nooka AK, Masszi T, [place], [place] I, [employer] V, Munder M, Hamilton, Mark TM, Qi M, Schect[place] J, [place], Qin X, Deraedt W, Ahmadi T, Spencer A, Sonneveld P; [employer] Investigators. Daratumumab, Bortezomib, and Dexamethasone for Multiple Myeloma. [employer] Corning Med. 2016 Aug 25;375(8):754-66  --among patients with relapsed or relapsed and refractory multiple myeloma, daratumumab in combination with bortezomib and dexamethasone resulted in significantly longer progression-free survival than bortezomib and dexamethasone alone and was associated with infusion-related reactions and higher rates of thrombocytopenia and neutropenia than bortezomib and dexamethasone alone.

## 2020-01-22 ENCOUNTER — Telehealth: Payer: Self-pay | Admitting: Hematology and Oncology

## 2020-01-22 NOTE — Telephone Encounter (Signed)
Called per 6/30 sch message to add in f/u appt - lab and infusion was adjust. Left message for wife with new appt time

## 2020-01-22 NOTE — Telephone Encounter (Signed)
Scheduled per los. Called and spoke with patients wife. Confirmed appt

## 2020-01-23 ENCOUNTER — Telehealth: Payer: Self-pay | Admitting: *Deleted

## 2020-01-23 ENCOUNTER — Other Ambulatory Visit: Payer: Self-pay | Admitting: *Deleted

## 2020-01-23 ENCOUNTER — Inpatient Hospital Stay: Payer: Medicare (Managed Care) | Admitting: Hematology and Oncology

## 2020-01-23 ENCOUNTER — Inpatient Hospital Stay: Payer: Medicare (Managed Care)

## 2020-01-23 ENCOUNTER — Ambulatory Visit: Payer: Medicare (Managed Care) | Admitting: Hematology and Oncology

## 2020-01-23 MED ORDER — CIPROFLOXACIN HCL 500 MG PO TABS
500.0000 mg | ORAL_TABLET | Freq: Two times a day (BID) | ORAL | 0 refills | Status: DC
Start: 2020-01-23 — End: 2020-02-13

## 2020-01-23 NOTE — Telephone Encounter (Signed)
TCT patient regarding appt today and results of urine culture done on 01/21/20 Spoke with patient's wife, Kennyth Lose.  Advised that Neils did not need to come in today@ 2pm, that the appt was placed in error. Kennyth Lose very upset about the changes in appts as pt is transported via [facility] and it takes a lot of arranging of schedules to get him here. Apologized for the scheduling error several times.  She is also upset about the changes to his schedule for 01/30/20. Informed her that the schedules were all tight next week due to the holiday but that we would make every effort to not change his schedule once it is made. She asked if we could make future appts later in the morning when possible as it is difficult to get him up and going before 8am. Assured her that I would pass this information on to scheduling. Advised that Mr. Riolo will need antibiotics for his UTI. Kennyth Lose states that [facility] arranges for his medications and to call them.  Also asked her about the diarrhea he was experiencing. She states the diarrhea has resolved and he has not had to take the Imodium for the last day or so.  Drinking fluids well. She will make sure his transportation is arranged for his appts next Friday, 01/30/20.  TCT PACe and spoke with Darrelyn Hillock, his Company secretary. Advised her of Cipro for his UTI and updated her on his upcoming appts.  She will take care of things on her end and call Mrs. Piccini back.  Message sent to scheduling about his preferred appt times.  Dr. Lorenso Courier aware of the above.

## 2020-01-25 LAB — URINE CULTURE: Culture: >=100000 — AB

## 2020-01-30 ENCOUNTER — Other Ambulatory Visit: Payer: Self-pay

## 2020-01-30 ENCOUNTER — Inpatient Hospital Stay: Payer: Medicare (Managed Care)

## 2020-01-30 ENCOUNTER — Other Ambulatory Visit: Payer: Self-pay | Admitting: Hematology and Oncology

## 2020-01-30 ENCOUNTER — Inpatient Hospital Stay: Payer: Medicare (Managed Care) | Attending: Hematology and Oncology | Admitting: Hematology and Oncology

## 2020-01-30 VITALS — BP 98/63 | HR 79 | Temp 97.0°F | Resp 17 | Ht 68.0 in | Wt 218.0 lb

## 2020-01-30 VITALS — BP 117/73 | HR 78 | Resp 18

## 2020-01-30 DIAGNOSIS — N179 Acute kidney failure, unspecified: Secondary | ICD-10-CM | POA: Diagnosis not present

## 2020-01-30 DIAGNOSIS — Z933 Colostomy status: Secondary | ICD-10-CM | POA: Insufficient documentation

## 2020-01-30 DIAGNOSIS — E119 Type 2 diabetes mellitus without complications: Secondary | ICD-10-CM | POA: Insufficient documentation

## 2020-01-30 DIAGNOSIS — R197 Diarrhea, unspecified: Secondary | ICD-10-CM | POA: Diagnosis not present

## 2020-01-30 DIAGNOSIS — G8220 Paraplegia, unspecified: Secondary | ICD-10-CM | POA: Insufficient documentation

## 2020-01-30 DIAGNOSIS — Z79899 Other long term (current) drug therapy: Secondary | ICD-10-CM | POA: Diagnosis not present

## 2020-01-30 DIAGNOSIS — C9002 Multiple myeloma in relapse: Secondary | ICD-10-CM

## 2020-01-30 DIAGNOSIS — E2740 Unspecified adrenocortical insufficiency: Secondary | ICD-10-CM | POA: Insufficient documentation

## 2020-01-30 DIAGNOSIS — Z7901 Long term (current) use of anticoagulants: Secondary | ICD-10-CM | POA: Diagnosis not present

## 2020-01-30 DIAGNOSIS — E860 Dehydration: Secondary | ICD-10-CM | POA: Diagnosis not present

## 2020-01-30 DIAGNOSIS — Z5111 Encounter for antineoplastic chemotherapy: Secondary | ICD-10-CM | POA: Insufficient documentation

## 2020-01-30 DIAGNOSIS — Z86718 Personal history of other venous thrombosis and embolism: Secondary | ICD-10-CM | POA: Insufficient documentation

## 2020-01-30 DIAGNOSIS — Z5112 Encounter for antineoplastic immunotherapy: Secondary | ICD-10-CM | POA: Insufficient documentation

## 2020-01-30 DIAGNOSIS — Z7189 Other specified counseling: Secondary | ICD-10-CM

## 2020-01-30 DIAGNOSIS — Z7984 Long term (current) use of oral hypoglycemic drugs: Secondary | ICD-10-CM | POA: Insufficient documentation

## 2020-01-30 LAB — CMP (CANCER CENTER ONLY)
ALT: 47 U/L — ABNORMAL HIGH (ref 0–44)
AST: 34 U/L (ref 15–41)
Albumin: 3.2 g/dL — ABNORMAL LOW (ref 3.5–5.0)
Alkaline Phosphatase: 138 U/L — ABNORMAL HIGH (ref 38–126)
Anion gap: 10 (ref 5–15)
BUN: 11 mg/dL (ref 8–23)
CO2: 26 mmol/L (ref 22–32)
Calcium: 9.7 mg/dL (ref 8.9–10.3)
Chloride: 104 mmol/L (ref 98–111)
Creatinine: 0.81 mg/dL (ref 0.61–1.24)
GFR, Est AFR Am: >60 mL/min (ref >60)
GFR, Estimated: >60 mL/min (ref >60)
Glucose, Bld: 98 mg/dL (ref 70–99)
Potassium: 4.1 mmol/L (ref 3.5–5.1)
Sodium: 140 mmol/L (ref 135–145)
Total Bilirubin: 0.5 mg/dL (ref 0.3–1.2)
Total Protein: 7.6 g/dL (ref 6.5–8.1)

## 2020-01-30 LAB — CBC WITH DIFFERENTIAL (CANCER CENTER ONLY)
Abs Immature Granulocytes: 0.01 10*3/uL (ref 0.00–0.07)
Basophils Absolute: 0 10*3/uL (ref 0.0–0.1)
Basophils Relative: 0 %
Eosinophils Absolute: 0.1 10*3/uL (ref 0.0–0.5)
Eosinophils Relative: 3 %
HCT: 29.6 % — ABNORMAL LOW (ref 39.0–52.0)
Hemoglobin: 10.2 g/dL — ABNORMAL LOW (ref 13.0–17.0)
Immature Granulocytes: 0 %
Lymphocytes Relative: 24 %
Lymphs Abs: 1 10*3/uL (ref 0.7–4.0)
MCH: 30 pg (ref 26.0–34.0)
MCHC: 34.5 g/dL (ref 30.0–36.0)
MCV: 87.1 fL (ref 80.0–100.0)
Monocytes Absolute: 0.5 10*3/uL (ref 0.1–1.0)
Monocytes Relative: 11 %
Neutro Abs: 2.5 10*3/uL (ref 1.7–7.7)
Neutrophils Relative %: 62 %
Platelet Count: 413 10*3/uL — ABNORMAL HIGH (ref 150–400)
RBC: 3.4 MIL/uL — ABNORMAL LOW (ref 4.22–5.81)
RDW: 15.9 % — ABNORMAL HIGH (ref 11.5–15.5)
WBC Count: 4.1 10*3/uL (ref 4.0–10.5)
nRBC: 0 % (ref 0.0–0.2)

## 2020-01-30 LAB — LACTATE DEHYDROGENASE: LDH: 133 U/L (ref 98–192)

## 2020-01-30 MED ORDER — DIPHENHYDRAMINE HCL 25 MG PO CAPS
25.0000 mg | ORAL_CAPSULE | Freq: Once | ORAL | Status: AC
  Administered 2020-01-30: 25 mg via ORAL

## 2020-01-30 MED ORDER — ACETAMINOPHEN 325 MG PO TABS
650.0000 mg | ORAL_TABLET | Freq: Once | ORAL | Status: AC
  Administered 2020-01-30: 650 mg via ORAL

## 2020-01-30 MED ORDER — DEXAMETHASONE 4 MG PO TABS
ORAL_TABLET | ORAL | Status: AC
  Filled 2020-01-30: qty 5

## 2020-01-30 MED ORDER — DEXAMETHASONE 4 MG PO TABS
20.0000 mg | ORAL_TABLET | Freq: Once | ORAL | Status: AC
  Administered 2020-01-30: 20 mg via ORAL

## 2020-01-30 MED ORDER — DARATUMUMAB-HYALURONIDASE-FIHJ 1800-30000 MG-UT/15ML ~~LOC~~ SOLN
1800.0000 mg | Freq: Once | SUBCUTANEOUS | Status: AC
  Administered 2020-01-30: 1800 mg via SUBCUTANEOUS
  Filled 2020-01-30: qty 15

## 2020-01-30 MED ORDER — BORTEZOMIB CHEMO SQ INJECTION 3.5 MG (2.5MG/ML)
1.3000 mg/m2 | Freq: Once | INTRAMUSCULAR | Status: AC
  Administered 2020-01-30: 2.75 mg via SUBCUTANEOUS
  Filled 2020-01-30: qty 1.1

## 2020-01-30 MED ORDER — DIPHENHYDRAMINE HCL 25 MG PO CAPS
ORAL_CAPSULE | ORAL | Status: AC
  Filled 2020-01-30: qty 1

## 2020-01-30 MED ORDER — ACETAMINOPHEN 325 MG PO TABS
ORAL_TABLET | ORAL | Status: AC
  Filled 2020-01-30: qty 2

## 2020-01-30 NOTE — Patient Instructions (Signed)
[facility] Discharge Instructions for Patients Receiving Chemotherapy  Today you received the following chemotherapy agents: darzalex faspro, velcade  To help prevent nausea and vomiting after your treatment, we encourage you to take your nausea medication   If you develop nausea and vomiting that is not controlled by your nausea medication, call the clinic.   BELOW ARE SYMPTOMS THAT SHOULD BE REPORTED IMMEDIATELY:  *FEVER GREATER THAN 100.5 F  *CHILLS WITH OR WITHOUT FEVER  NAUSEA AND VOMITING THAT IS NOT CONTROLLED WITH YOUR NAUSEA MEDICATION  *UNUSUAL SHORTNESS OF BREATH  *UNUSUAL BRUISING OR BLEEDING  TENDERNESS IN MOUTH AND THROAT WITH OR WITHOUT PRESENCE OF ULCERS  *URINARY PROBLEMS  *BOWEL PROBLEMS  UNUSUAL RASH Items with * indicate a potential emergency and should be followed up as soon as possible.  Feel free to call the clinic should you have any questions or concerns. The clinic phone number is [phone].  Please show the [facility] at check-in to the Emergency Department and triage nurse.

## 2020-01-30 NOTE — Progress Notes (Signed)
Okay to decrease Faspro post observation time to 22min, per Dr. Lorenso Courier.

## 2020-02-03 ENCOUNTER — Encounter: Payer: Self-pay | Admitting: Hematology and Oncology

## 2020-02-03 NOTE — Progress Notes (Signed)
[facility] [phone]   [phone]  PROGRESS NOTE  Patient Care Team: [name], MD as PCP - General (Family Medicine) [name], MD as Consulting Physician (Physical Medicine and Rehabilitation) Donia Guiles Lavon Paganini, PA-C as Physician Assistant (Physical Medicine and Rehabilitation) [name], MD as Consulting Physician (Hematology and Oncology)  Hematological/Oncological History # IgG Lambda Multiple Myeloma, Relapsed (ISS Stage II) 1) 06/2010: initial diagnosis of Multiple Myeloma after T8 compression fracture. Treated with Velcade/Revlimid/Dexamethasone and achieved a complete remission 2) Velcade was discontinued in September 2012 and that Revlimid and Decadron were discontinued in March 2013. 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 4) Followed by Dr. Alvy Bimler, last clinic visit 10/09/2019. At that time there was concern for relapse of his multiple myeloma.  5) Patient requested transfer to different provider after misunderstanding regarding imaging studies 6) 12/17/2019: transfer care to Dr. Lorenso Courier  7) 01/09/2020: Cycle 1 Day 1 of Dara/Velcade/Dex 8) 01/21/2020: presented as urgent visit for diarrhea and dehydration. Holding chemotherapy scheduled for 01/23/2020. 9) 01/30/2020: resume dara/velcade/dex after resolution of diarrhea.   Interval History:  DANEIL BEEM 64 y.o. male with medical history significant for  IgG Lambda Multiple Myeloma who presents for a follow up visit. The patient's last visit was on 01/21/2020 as an urgent visit for heavy diarrhea.  On exam today Mr. Pogosyan notes his colostomy output is still increased and watery, but much improved from previously.  He notes that it is declining without intervention such as antidiuretic therapy.  He reports that he is feeling much better but still has a tendency to sleep quite a  lot.  He notes that he has had worsening of a pressure sore on his bottom, but that he is having this looked at and he is getting a pressure pad to help relieve the sore.  Otherwise he currently denies having any issues with fevers, chills, sweats, nausea, vomiting or diarrhea.  He notes that he is not having any neurological symptoms.  A full 10 point ROS is listed below.  MEDICAL HISTORY:  Past Medical History:  Diagnosis Date  . Adrenal insufficiency (HCC)    on chronic dexamethasone  . Anemia   . Cancer ([place])   . Coagulopathy ([place])    on xeralto/ s/p DVT while on coumadin,  IVC in place  . Diabetes mellitus without complication ([place])    type 2  . Gross hematuria 7/14   post foley cath procedure  . History of blood transfusion 7/14  . Multiple myeloma    thoracic T8 with paraplegia s/p resection- on chemo at visit 10/13/10  . Multiple myeloma   . Multiple myeloma without mention of remission   . Neurogenic bladder   . Neurogenic bowel   . Paraplegia ([place])   . Partial small bowel obstruction ([place]) during dec 2011 admission    SURGICAL HISTORY: Past Surgical History:  Procedure Laterality Date  . COLONOSCOPY WITH PROPOFOL N/A 04/12/2017   Procedure: COLONOSCOPY WITH PROPOFOL;  Surgeon: Irene Shipper, MD;  Location: [facility];  Service: Endoscopy;  Laterality: N/A;  . COLONOSCOPY WITH PROPOFOL N/A 04/19/2017   Procedure: COLONOSCOPY WITH PROPOFOL;  Surgeon: Yetta Flock, MD;  Location: [facility];  Service: Gastroenterology;  Laterality: N/A;  . COLOSTOMY  07/20/2011   Procedure: COLOSTOMY;  Surgeon: Judieth Keens, DO;  Location: [facility];  Service: General;;  . COLOSTOMY REVISION  07/20/2011  Procedure: COLON RESECTION SIGMOID;  Surgeon: Judieth Keens, DO;  Location: [facility];  Service: General;;  . CYSTOSCOPY N/A 04/04/2013   Procedure: CYSTOSCOPY WITH LITHALOPAXY;  Surgeon: Alexis Frock, MD;  Location: [facility];  Service: Urology;  Laterality: N/A;  . INSERTION  OF SUPRAPUBIC CATHETER N/A 04/04/2013   Procedure: INSERTION OF SUPRAPUBIC CATHETER;  Surgeon: Alexis Frock, MD;  Location: [facility];  Service: Urology;  Laterality: N/A;  . LAPAROTOMY  07/20/2011   Procedure: EXPLORATORY LAPAROTOMY;  Surgeon: Judieth Keens, DO;  Location: [facility];  Service: General;  Laterality: N/A;  . myeloma thoracic T8 with parpaplegia s/p thoracotomy and thoracic T7-9 cage placement on Dec 26th 2011  07/18/10    SOCIAL HISTORY: Social History   Socioeconomic History  . Marital status: Married    Spouse name: Not on file  . Number of children: Not on file  . Years of education: Not on file  . Highest education level: Not on file  Occupational History  . Not on file  Tobacco Use  . Smoking status: Never Smoker  . Smokeless tobacco: Never Used  Vaping Use  . Vaping Use: Never used  Substance and Sexual Activity  . Alcohol use: No  . Drug use: No  . Sexual activity: Never  Other Topics Concern  . Not on file  Social History Narrative  . Not on file   Social Determinants of Health   Financial Resource Strain:   . Difficulty of Paying Living Expenses:   Food Insecurity:   . Worried About Charity fundraiser in the Last Year:   . Arboriculturist in the Last Year:   Transportation Needs:   . Film/video editor (Medical):   Marland Kitchen Lack of Transportation (Non-Medical):   Physical Activity:   . Days of Exercise per Week:   . Minutes of Exercise per Session:   Stress:   . Feeling of Stress :   Social Connections:   . Frequency of Communication with Friends and Family:   . Frequency of Social Gatherings with Friends and Family:   . Attends Religious Services:   . Active Member of Clubs or Organizations:   . Attends Archivist Meetings:   Marland Kitchen Marital Status:   Intimate Partner Violence:   . Fear of Current or Ex-Partner:   . Emotionally Abused:   Marland Kitchen Physically Abused:   . Sexually Abused:     FAMILY HISTORY: Family History  Problem  Relation Age of Onset  . Cancer Mother   . Ovarian cancer Mother   . Diabetes Father     ALLERGIES:  is allergic to feraheme [ferumoxytol].  MEDICATIONS:  Current Outpatient Medications  Medication Sig Dispense Refill  . acyclovir (ZOVIRAX) 400 MG tablet Take 1 tablet (400 mg total) by mouth 2 (two) times daily. 60 tablet 2  . baclofen (LIORESAL) 20 MG tablet Take 20 mg by mouth 2 (two) times daily.    . brinzolamide (AZOPT) 1 % ophthalmic suspension Place 1 drop into both eyes 2 (two) times daily.    . ciprofloxacin (CIPRO) 500 MG tablet Take 1 tablet (500 mg total) by mouth 2 (two) times daily. 14 tablet 0  . dorzolamide (TRUSOPT) 2 % ophthalmic solution 1 drop 2 (two) times daily.    . iron polysaccharides (NU-IRON) 150 MG capsule Take 1 capsule (150 mg total) by mouth daily. 30 capsule 0  . latanoprost (XALATAN) 0.005 % ophthalmic solution Place 1 drop into both eyes at bedtime.    Marland Kitchen  metFORMIN (GLUCOPHAGE) 500 MG tablet Take 500 mg by mouth 2 (two) times daily with a meal.    . ondansetron (ZOFRAN) 8 MG tablet Take 1 tablet (8 mg total) by mouth every 8 (eight) hours as needed for nausea or vomiting. 30 tablet 0  . rivaroxaban (XARELTO) 10 MG TABS tablet Take 1 tablet (10 mg total) by mouth daily with supper. 30 tablet 9  . Skin Protectants, Misc. (EUCERIN) cream Apply 1 application topically 2 (two) times daily as needed for dry skin.     Marland Kitchen zinc oxide (BALMEX) 11.3 % CREA cream Apply 1 application topically 2 (two) times daily.     No current facility-administered medications for this visit.    REVIEW OF SYSTEMS:   Constitutional: ( - ) fevers, ( - )  chills , ( - ) night sweats Eyes: ( - ) blurriness of vision, ( - ) double vision, ( - ) watery eyes Ears, nose, mouth, throat, and face: ( - ) mucositis, ( - ) sore throat Respiratory: ( - ) cough, ( - ) dyspnea, ( - ) wheezes Cardiovascular: ( - ) palpitation, ( - ) chest discomfort, ( - ) lower extremity  swelling Gastrointestinal:  ( - ) nausea, ( - ) heartburn, ( - ) change in bowel habits Skin: ( - ) abnormal skin rashes Lymphatics: ( - ) new lymphadenopathy, ( - ) easy bruising Neurological: ( - ) numbness, ( - ) tingling, ( - ) new weaknesses Behavioral/Psych: ( - ) mood change, ( - ) new changes  All other systems were reviewed with the patient and are negative.  PHYSICAL EXAMINATION: ECOG PERFORMANCE STATUS: paraplegic.   Vitals:   01/30/20 0802  BP: 98/63  Pulse: 79  Resp: 17  Temp: (!) 97 F (36.1 C)  SpO2: 100%   Filed Weights   01/30/20 0802  Weight: 218 lb (98.9 kg)    GENERAL: well appearing middle aged [place] American male alert, no distress and comfortable SKIN: skin color, texture, turgor are normal, no rashes or significant lesions EYES: conjunctiva are pink and non-injected, sclera clear LUNGS: clear to auscultation and percussion with normal breathing effort HEART: regular rate & rhythm and no murmurs and no lower extremity edema Musculoskeletal: no cyanosis of digits and no clubbing  PSYCH: alert & oriented x 3, fluent speech NEURO: paraplegic, no use of LE bilaterally.   LABORATORY DATA:  I have reviewed the data as listed CBC Latest Ref Rng & Units 01/30/2020 01/21/2020 01/16/2020  WBC 4.0 - 10.5 K/uL 4.1 1.8(L) 4.1  Hemoglobin 13.0 - 17.0 g/dL 10.2(L) 10.7(L) 11.4(L)  Hematocrit 39 - 52 % 29.6(L) 30.5(L) 32.7(L)  Platelets 150 - 400 K/uL 413(H) 114(L) 198    CMP Latest Ref Rng & Units 01/30/2020 01/21/2020 01/16/2020  Glucose 70 - 99 mg/dL 98 98 119(H)  BUN 8 - 23 mg/dL 11 21 9   Creatinine 0.61 - 1.24 mg/dL 0.81 1.27(H) 0.86  Sodium 135 - 145 mmol/L 140 135 140  Potassium 3.5 - 5.1 mmol/L 4.1 4.0 3.7  Chloride 98 - 111 mmol/L 104 103 106  CO2 22 - 32 mmol/L 26 21(L) 23  Calcium 8.9 - 10.3 mg/dL 9.7 8.5(L) 9.2  Total Protein 6.5 - 8.1 g/dL 7.6 7.5 8.4(H)  Total Bilirubin 0.3 - 1.2 mg/dL 0.5 0.3 0.2(L)  Alkaline Phos 38 - 126 U/L 138(H) 76 80  AST  15 - 41 U/L 34 98(H) 18  ALT 0 - 44 U/L 47(H) 48(H) 19  Lab Results  Component Value Date   MPROTEIN 2.5 (H) 12/18/2019   MPROTEIN 1.9 (H) 09/24/2019   MPROTEIN 0.7 (H) 09/17/2018   Lab Results  Component Value Date   KPAFRELGTCHN 9.5 12/18/2019   KPAFRELGTCHN 11.9 09/24/2019   KPAFRELGTCHN 22.7 (H) 09/17/2018   LAMBDASER 216.8 (H) 12/18/2019   LAMBDASER 158.5 (H) 09/24/2019   LAMBDASER 42.0 (H) 09/17/2018   KAPLAMBRATIO 3.53 12/29/2019   KAPLAMBRATIO 0.04 (L) 12/18/2019   KAPLAMBRATIO 0.08 (L) 09/24/2019    RADIOGRAPHIC STUDIES: No results found.  ASSESSMENT & PLAN DEHAVEN SINE 64 y.o. male with medical history significant for  IgG Lambda Multiple Myeloma who presents for a follow up visit.  After review of the labs, discussion with the patient, and reviewed the imaging his findings are most consistent with a relapsed multiple myeloma.The patient has had excellent success before with treatment of his myeloma with Velcade, Revlimid, and dexamethasone.  On exam today [name] appears much improved from prior.  He notes that despite the fact that his chemotherapy may have been causing his diarrhea he is willing to continue at full dose with all of the medications in his regimen.  In the event his diarrhea worsen again we would need to consider dropping bortezomib and replacing it with another medication.  The patient voices understanding of this finding.  Additionally in order to help control his diarrhea recommended Imodium as well as increase fiber in his diet.  He voiced his understanding of this plan moving forward and that he would call us if he was having any worsening symptoms.  Treatment regimen consists of bortezomib 1.60m/m2 subq (Day 1 and 8), daratumumab 1800 mg subq (day 1,8 and 15), and dexamethasone 20 mg PO (day 1, 8 and 15). ([employer] 2016; [id number]  #Watery Diarrhea, improving #Dehydration, improving  #AKI, improved --presented at his last visit with  increased watery ostomy output x 3 days with low grade temperatures (below 100.4 F) --will provide 2 L of IV NS in clinic today. Encourage increased PO intake of fluids --negative C. Diff. Recommend he start loperamide 265mq2H PRN for diarrhea (OTC medication) --urine grew P. Mirabilis and patient was treated empirically with cirpo. Culture found to be resistant, but symptoms resolved. Possible contaminant in his foley bag. Continue to monitor.  --we will resume chemotherapy    # IgG Lambda Multiple Myeloma, Relapsed (ISS Stage II) --findings are most consistent with relapsed multiple myeloma. Patient previously successfully treated with Velcade/Rev/Dex --current plan is Daratumumab/Velcade/Dex  ( started 01/09/2020)  --recollect restaging MM labs monthly to include MM panel, SFLC, and UPEP. Additionally will collect LDH and beta 2 microglobulin.  --will have patient evaluated by dentistry prior to restarting Zometa therapy. --f/u on 02/13/2020 prior to Cycle 2 Day 15  #History of DVT --He had placement of IVC filter, remains on Xarelto. --Due to poor mobility, and lack of bleeding complications, I recommend he remain on Xarelto indefinitely. --caution if Plt count were to drop <50  # Supportive Care -- provided patient with an albuterol inhaler (for use with daratumumab) --acyclovir 40049mID for VZV prophylaxis --zofran 8mg31mH PRN for nausea/vomiting  --Zometa to be considered after dental clearance --patient recieved pretreatment RBC phenotype  No orders of the defined types were placed in this encounter.   All questions were answered. The patient knows to call the clinic with any problems, questions or concerns.  A total of more than 30 minutes were spent on this encounter and over  half of that time was spent on counseling and coordination of care as outlined above.   Ledell Peoples, MD Department of Hematology/Oncology [facility] Phone:  [phone] Pager: [phone] Email: [email or web address]  02/03/2020 12:32 PM   Literature Support:  Archie Endo, Weisel K, Nooka AK, Masszi T, [place], [place] I, [place] V, Munder M, Staves, Mark TM, Qi M, Schecter J, Bagley, Qin X, Deraedt W, Ahmadi T, Spencer A, Sonneveld P; [employer] Investigators. Daratumumab, Bortezomib, and Dexamethasone for Multiple Myeloma. [employer]. 2016 Aug 25;375(8):754-66  --among patients with relapsed or relapsed and refractory multiple myeloma, daratumumab in combination with bortezomib and dexamethasone resulted in significantly longer progression-free survival than bortezomib and dexamethasone alone and was associated with infusion-related reactions and higher rates of thrombocytopenia and neutropenia than bortezomib and dexamethasone alone.

## 2020-02-06 ENCOUNTER — Inpatient Hospital Stay: Payer: Medicare (Managed Care)

## 2020-02-06 ENCOUNTER — Other Ambulatory Visit: Payer: Self-pay

## 2020-02-06 VITALS — BP 111/70 | HR 71 | Temp 98.5°F | Resp 18

## 2020-02-06 DIAGNOSIS — Z7189 Other specified counseling: Secondary | ICD-10-CM

## 2020-02-06 DIAGNOSIS — C9002 Multiple myeloma in relapse: Secondary | ICD-10-CM

## 2020-02-06 DIAGNOSIS — Z5112 Encounter for antineoplastic immunotherapy: Secondary | ICD-10-CM | POA: Diagnosis not present

## 2020-02-06 LAB — CBC WITH DIFFERENTIAL (CANCER CENTER ONLY)
Abs Immature Granulocytes: 0.03 10*3/uL (ref 0.00–0.07)
Basophils Absolute: 0 10*3/uL (ref 0.0–0.1)
Basophils Relative: 0 %
Eosinophils Absolute: 0.1 10*3/uL (ref 0.0–0.5)
Eosinophils Relative: 2 %
HCT: 31.1 % — ABNORMAL LOW (ref 39.0–52.0)
Hemoglobin: 10.7 g/dL — ABNORMAL LOW (ref 13.0–17.0)
Immature Granulocytes: 1 %
Lymphocytes Relative: 17 %
Lymphs Abs: 1.1 10*3/uL (ref 0.7–4.0)
MCH: 30.1 pg (ref 26.0–34.0)
MCHC: 34.4 g/dL (ref 30.0–36.0)
MCV: 87.6 fL (ref 80.0–100.0)
Monocytes Absolute: 0.7 10*3/uL (ref 0.1–1.0)
Monocytes Relative: 11 %
Neutro Abs: 4.5 10*3/uL (ref 1.7–7.7)
Neutrophils Relative %: 69 %
Platelet Count: 289 10*3/uL (ref 150–400)
RBC: 3.55 MIL/uL — ABNORMAL LOW (ref 4.22–5.81)
RDW: 16.1 % — ABNORMAL HIGH (ref 11.5–15.5)
WBC Count: 6.5 10*3/uL (ref 4.0–10.5)
nRBC: 0 % (ref 0.0–0.2)

## 2020-02-06 LAB — CMP (CANCER CENTER ONLY)
ALT: 23 U/L (ref 0–44)
AST: 12 U/L — ABNORMAL LOW (ref 15–41)
Albumin: 4.2 g/dL (ref 3.5–5.0)
Alkaline Phosphatase: 106 U/L (ref 38–126)
Anion gap: 8 (ref 5–15)
BUN: 18 mg/dL (ref 8–23)
CO2: 30 mmol/L (ref 22–32)
Calcium: 9.8 mg/dL (ref 8.9–10.3)
Chloride: 104 mmol/L (ref 98–111)
Creatinine: 0.81 mg/dL (ref 0.61–1.24)
GFR, Est AFR Am: >60 mL/min (ref >60)
GFR, Estimated: >60 mL/min (ref >60)
Glucose, Bld: 132 mg/dL — ABNORMAL HIGH (ref 70–99)
Potassium: 4.5 mmol/L (ref 3.5–5.1)
Sodium: 142 mmol/L (ref 135–145)
Total Bilirubin: 0.5 mg/dL (ref 0.3–1.2)
Total Protein: 7.2 g/dL (ref 6.5–8.1)

## 2020-02-06 LAB — LACTATE DEHYDROGENASE: LDH: 95 U/L — ABNORMAL LOW (ref 98–192)

## 2020-02-06 MED ORDER — DEXAMETHASONE 4 MG PO TABS
ORAL_TABLET | ORAL | Status: AC
  Filled 2020-02-06: qty 5

## 2020-02-06 MED ORDER — BORTEZOMIB CHEMO SQ INJECTION 3.5 MG (2.5MG/ML)
1.3000 mg/m2 | Freq: Once | INTRAMUSCULAR | Status: AC
  Administered 2020-02-06: 2.75 mg via SUBCUTANEOUS
  Filled 2020-02-06: qty 1.1

## 2020-02-06 MED ORDER — DIPHENHYDRAMINE HCL 25 MG PO CAPS
25.0000 mg | ORAL_CAPSULE | Freq: Once | ORAL | Status: AC
  Administered 2020-02-06: 25 mg via ORAL

## 2020-02-06 MED ORDER — DARATUMUMAB-HYALURONIDASE-FIHJ 1800-30000 MG-UT/15ML ~~LOC~~ SOLN
1800.0000 mg | Freq: Once | SUBCUTANEOUS | Status: AC
  Administered 2020-02-06: 1800 mg via SUBCUTANEOUS
  Filled 2020-02-06: qty 15

## 2020-02-06 MED ORDER — ACETAMINOPHEN 325 MG PO TABS
650.0000 mg | ORAL_TABLET | Freq: Once | ORAL | Status: AC
  Administered 2020-02-06: 650 mg via ORAL

## 2020-02-06 MED ORDER — DIPHENHYDRAMINE HCL 25 MG PO CAPS
ORAL_CAPSULE | ORAL | Status: AC
  Filled 2020-02-06: qty 1

## 2020-02-06 MED ORDER — ACETAMINOPHEN 325 MG PO TABS
ORAL_TABLET | ORAL | Status: AC
  Filled 2020-02-06: qty 2

## 2020-02-06 MED ORDER — DEXAMETHASONE 4 MG PO TABS
20.0000 mg | ORAL_TABLET | Freq: Once | ORAL | Status: AC
  Administered 2020-02-06: 20 mg via ORAL

## 2020-02-06 NOTE — Patient Instructions (Signed)
[facility] Discharge Instructions for Patients Receiving Chemotherapy  Today you received the following chemotherapy agents: darzalex faspro, velcade  To help prevent nausea and vomiting after your treatment, we encourage you to take your nausea medication   If you develop nausea and vomiting that is not controlled by your nausea medication, call the clinic.   BELOW ARE SYMPTOMS THAT SHOULD BE REPORTED IMMEDIATELY:  *FEVER GREATER THAN 100.5 F  *CHILLS WITH OR WITHOUT FEVER  NAUSEA AND VOMITING THAT IS NOT CONTROLLED WITH YOUR NAUSEA MEDICATION  *UNUSUAL SHORTNESS OF BREATH  *UNUSUAL BRUISING OR BLEEDING  TENDERNESS IN MOUTH AND THROAT WITH OR WITHOUT PRESENCE OF ULCERS  *URINARY PROBLEMS  *BOWEL PROBLEMS  UNUSUAL RASH Items with * indicate a potential emergency and should be followed up as soon as possible.  Feel free to call the clinic should you have any questions or concerns. The clinic phone number is [phone].  Please show the [facility] at check-in to the Emergency Department and triage nurse.

## 2020-02-12 ENCOUNTER — Other Ambulatory Visit: Payer: Self-pay | Admitting: Hematology and Oncology

## 2020-02-12 DIAGNOSIS — C9002 Multiple myeloma in relapse: Secondary | ICD-10-CM

## 2020-02-13 ENCOUNTER — Other Ambulatory Visit: Payer: Medicare (Managed Care)

## 2020-02-13 ENCOUNTER — Other Ambulatory Visit: Payer: Self-pay

## 2020-02-13 ENCOUNTER — Inpatient Hospital Stay: Payer: Medicare (Managed Care)

## 2020-02-13 ENCOUNTER — Inpatient Hospital Stay (HOSPITAL_BASED_OUTPATIENT_CLINIC_OR_DEPARTMENT_OTHER): Payer: Medicare (Managed Care) | Admitting: Hematology and Oncology

## 2020-02-13 VITALS — HR 93 | Wt 209.6 lb

## 2020-02-13 VITALS — BP 107/85 | HR 108 | Temp 97.7°F | Resp 19 | Ht 68.0 in

## 2020-02-13 DIAGNOSIS — C9002 Multiple myeloma in relapse: Secondary | ICD-10-CM | POA: Diagnosis not present

## 2020-02-13 DIAGNOSIS — Z86718 Personal history of other venous thrombosis and embolism: Secondary | ICD-10-CM | POA: Diagnosis not present

## 2020-02-13 DIAGNOSIS — M87180 Osteonecrosis due to drugs, jaw: Secondary | ICD-10-CM

## 2020-02-13 DIAGNOSIS — Z5112 Encounter for antineoplastic immunotherapy: Secondary | ICD-10-CM | POA: Diagnosis not present

## 2020-02-13 DIAGNOSIS — Z7189 Other specified counseling: Secondary | ICD-10-CM

## 2020-02-13 LAB — CBC WITH DIFFERENTIAL (CANCER CENTER ONLY)
Abs Immature Granulocytes: 0.02 10*3/uL (ref 0.00–0.07)
Basophils Absolute: 0 10*3/uL (ref 0.0–0.1)
Basophils Relative: 0 %
Eosinophils Absolute: 0.2 10*3/uL (ref 0.0–0.5)
Eosinophils Relative: 2 %
HCT: 30.6 % — ABNORMAL LOW (ref 39.0–52.0)
Hemoglobin: 10.7 g/dL — ABNORMAL LOW (ref 13.0–17.0)
Immature Granulocytes: 0 %
Lymphocytes Relative: 12 %
Lymphs Abs: 0.9 10*3/uL (ref 0.7–4.0)
MCH: 30.1 pg (ref 26.0–34.0)
MCHC: 35 g/dL (ref 30.0–36.0)
MCV: 86 fL (ref 80.0–100.0)
Monocytes Absolute: 0.6 10*3/uL (ref 0.1–1.0)
Monocytes Relative: 8 %
Neutro Abs: 6 10*3/uL (ref 1.7–7.7)
Neutrophils Relative %: 78 %
Platelet Count: 225 10*3/uL (ref 150–400)
RBC: 3.56 MIL/uL — ABNORMAL LOW (ref 4.22–5.81)
RDW: 16.4 % — ABNORMAL HIGH (ref 11.5–15.5)
WBC Count: 7.7 10*3/uL (ref 4.0–10.5)
nRBC: 0 % (ref 0.0–0.2)

## 2020-02-13 LAB — CMP (CANCER CENTER ONLY)
ALT: 30 U/L (ref 0–44)
AST: 19 U/L (ref 15–41)
Albumin: 3.5 g/dL (ref 3.5–5.0)
Alkaline Phosphatase: 122 U/L (ref 38–126)
Anion gap: 11 (ref 5–15)
BUN: 18 mg/dL (ref 8–23)
CO2: 26 mmol/L (ref 22–32)
Calcium: 10.1 mg/dL (ref 8.9–10.3)
Chloride: 104 mmol/L (ref 98–111)
Creatinine: 0.8 mg/dL (ref 0.61–1.24)
GFR, Est AFR Am: >60 mL/min (ref >60)
GFR, Estimated: >60 mL/min (ref >60)
Glucose, Bld: 98 mg/dL (ref 70–99)
Potassium: 4 mmol/L (ref 3.5–5.1)
Sodium: 141 mmol/L (ref 135–145)
Total Bilirubin: 0.6 mg/dL (ref 0.3–1.2)
Total Protein: 7.3 g/dL (ref 6.5–8.1)

## 2020-02-13 LAB — LACTATE DEHYDROGENASE: LDH: 97 U/L — ABNORMAL LOW (ref 98–192)

## 2020-02-13 MED ORDER — DEXAMETHASONE 4 MG PO TABS
20.0000 mg | ORAL_TABLET | Freq: Once | ORAL | Status: AC
  Administered 2020-02-13: 20 mg via ORAL

## 2020-02-13 MED ORDER — BORTEZOMIB CHEMO SQ INJECTION 3.5 MG (2.5MG/ML)
1.3000 mg/m2 | Freq: Once | INTRAMUSCULAR | Status: AC
  Administered 2020-02-13: 2.75 mg via SUBCUTANEOUS
  Filled 2020-02-13: qty 1.1

## 2020-02-13 MED ORDER — DARATUMUMAB-HYALURONIDASE-FIHJ 1800-30000 MG-UT/15ML ~~LOC~~ SOLN
1800.0000 mg | Freq: Once | SUBCUTANEOUS | Status: AC
  Administered 2020-02-13: 1800 mg via SUBCUTANEOUS
  Filled 2020-02-13: qty 15

## 2020-02-13 MED ORDER — DIPHENHYDRAMINE HCL 25 MG PO CAPS
ORAL_CAPSULE | ORAL | Status: AC
  Filled 2020-02-13: qty 1

## 2020-02-13 MED ORDER — DEXAMETHASONE 4 MG PO TABS
ORAL_TABLET | ORAL | Status: AC
  Filled 2020-02-13: qty 5

## 2020-02-13 MED ORDER — DIPHENHYDRAMINE HCL 25 MG PO CAPS
25.0000 mg | ORAL_CAPSULE | Freq: Once | ORAL | Status: AC
  Administered 2020-02-13: 25 mg via ORAL

## 2020-02-13 MED ORDER — ACETAMINOPHEN 325 MG PO TABS
650.0000 mg | ORAL_TABLET | Freq: Once | ORAL | Status: AC
  Administered 2020-02-13: 650 mg via ORAL

## 2020-02-13 MED ORDER — ACETAMINOPHEN 325 MG PO TABS
ORAL_TABLET | ORAL | Status: AC
  Filled 2020-02-13: qty 2

## 2020-02-13 NOTE — Progress Notes (Signed)
[facility] [phone]   [phone]  PROGRESS NOTE  Patient Care Team: [name], MD as PCP - General (Family Medicine) [name], MD as Consulting Physician (Physical Medicine and Rehabilitation) Donia Guiles Lavon Paganini, PA-C as Physician Assistant (Physical Medicine and Rehabilitation) [name], MD as Consulting Physician (Hematology and Oncology)  Hematological/Oncological History # IgG Lambda Multiple Myeloma, Relapsed (ISS Stage II) 1) 06/2010: initial diagnosis of Multiple Myeloma after T8 compression fracture. Treated with Velcade/Revlimid/Dexamethasone and achieved a complete remission 2) Velcade was discontinued in September 2012 and that Revlimid and Decadron were discontinued in March 2013. 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 4) Followed by Dr. Alvy Bimler, last clinic visit 10/09/2019. At that time there was concern for relapse of his multiple myeloma.  5) Patient requested transfer to different provider after misunderstanding regarding imaging studies 6) 12/17/2019: transfer care to Dr. Lorenso Courier  7) 01/09/2020: Cycle 1 Day 1 of Dara/Velcade/Dex 8) 01/21/2020: presented as urgent visit for diarrhea and dehydration. Holding chemotherapy scheduled for 01/23/2020. 9) 01/30/2020: Resume dara/velcade/dex after resolution of diarrhea.   Interval History:  DEV DHONDT 64 y.o. male with medical history significant for  IgG Lambda Multiple Myeloma who presents for a follow up visit. The patient's last visit was on 01/30/2020 when he restarted therapy after episodes of diarrhea.  On exam today Mr. Gillock notes his colostomy output has returned to baseline.  He notes that he did have a few episodes of diarrhea when drinking chocolate Ensure, but otherwise when using vanilla or strawberry he has had no issues with that.  He notes that he is also  not having any foul smell with his urine or any discharge otherwise.  He reports that a few days ago his suprapubic catheter did fall out following an exchange and that he was recently given new cream to help treat his bedsores.  He notes that his appetite is kind of touch and go and that he is not having any tightness in the chest and his energy is improved.  Otherwise he had no other concerns or complaints today.  A full 10 point ROS is listed below.  MEDICAL HISTORY:  Past Medical History:  Diagnosis Date  . Adrenal insufficiency (HCC)    on chronic dexamethasone  . Anemia   . Cancer ([place])   . Coagulopathy ([place])    on xeralto/ s/p DVT while on coumadin,  IVC in place  . Diabetes mellitus without complication ([place])    type 2  . Gross hematuria 7/14   post foley cath procedure  . History of blood transfusion 7/14  . Multiple myeloma    thoracic T8 with paraplegia s/p resection- on chemo at visit 10/13/10  . Multiple myeloma   . Multiple myeloma without mention of remission   . Neurogenic bladder   . Neurogenic bowel   . Paraplegia ([place])   . Partial small bowel obstruction ([place]) during dec 2011 admission    SURGICAL HISTORY: Past Surgical History:  Procedure Laterality Date  . COLONOSCOPY WITH PROPOFOL N/A 04/12/2017   Procedure: COLONOSCOPY WITH PROPOFOL;  Surgeon: Irene Shipper, MD;  Location: [facility];  Service: Endoscopy;  Laterality: N/A;  . COLONOSCOPY WITH PROPOFOL N/A 04/19/2017   Procedure: COLONOSCOPY WITH PROPOFOL;  Surgeon: Yetta Flock, MD;  Location: [facility];  Service: Gastroenterology;  Laterality: N/A;  . COLOSTOMY  07/20/2011   Procedure: COLOSTOMY;  Surgeon: Judieth Keens, DO;  Location: [facility] OR;  Service: General;;  . COLOSTOMY REVISION  07/20/2011   Procedure: COLON RESECTION SIGMOID;  Surgeon: Judieth Keens, DO;  Location: [facility];  Service: General;;  . CYSTOSCOPY N/A 04/04/2013   Procedure: CYSTOSCOPY WITH LITHALOPAXY;  Surgeon: Alexis Frock, MD;  Location: [facility];  Service: Urology;  Laterality: N/A;  . INSERTION OF SUPRAPUBIC CATHETER N/A 04/04/2013   Procedure: INSERTION OF SUPRAPUBIC CATHETER;  Surgeon: Alexis Frock, MD;  Location: [facility];  Service: Urology;  Laterality: N/A;  . LAPAROTOMY  07/20/2011   Procedure: EXPLORATORY LAPAROTOMY;  Surgeon: Judieth Keens, DO;  Location: [facility];  Service: General;  Laterality: N/A;  . myeloma thoracic T8 with parpaplegia s/p thoracotomy and thoracic T7-9 cage placement on Dec 26th 2011  07/18/10    SOCIAL HISTORY: Social History   Socioeconomic History  . Marital status: Married    Spouse name: Not on file  . Number of children: Not on file  . Years of education: Not on file  . Highest education level: Not on file  Occupational History  . Not on file  Tobacco Use  . Smoking status: Never Smoker  . Smokeless tobacco: Never Used  Vaping Use  . Vaping Use: Never used  Substance and Sexual Activity  . Alcohol use: No  . Drug use: No  . Sexual activity: Never  Other Topics Concern  . Not on file  Social History Narrative  . Not on file   Social Determinants of Health   Financial Resource Strain:   . Difficulty of Paying Living Expenses:   Food Insecurity:   . Worried About Charity fundraiser in the Last Year:   . Arboriculturist in the Last Year:   Transportation Needs:   . Film/video editor (Medical):   Marland Kitchen Lack of Transportation (Non-Medical):   Physical Activity:   . Days of Exercise per Week:   . Minutes of Exercise per Session:   Stress:   . Feeling of Stress :   Social Connections:   . Frequency of Communication with Friends and Family:   . Frequency of Social Gatherings with Friends and Family:   . Attends Religious Services:   . Active Member of Clubs or Organizations:   . Attends Archivist Meetings:   Marland Kitchen Marital Status:   Intimate Partner Violence:   . Fear of Current or Ex-Partner:   . Emotionally Abused:   Marland Kitchen Physically  Abused:   . Sexually Abused:     FAMILY HISTORY: Family History  Problem Relation Age of Onset  . Cancer Mother   . Ovarian cancer Mother   . Diabetes Father     ALLERGIES:  is allergic to feraheme [ferumoxytol].  MEDICATIONS:  Current Outpatient Medications  Medication Sig Dispense Refill  . acyclovir (ZOVIRAX) 400 MG tablet Take 1 tablet (400 mg total) by mouth 2 (two) times daily. 60 tablet 2  . baclofen (LIORESAL) 20 MG tablet Take 20 mg by mouth 2 (two) times daily.    . brinzolamide (AZOPT) 1 % ophthalmic suspension Place 1 drop into both eyes 2 (two) times daily.    . dorzolamide (TRUSOPT) 2 % ophthalmic solution 1 drop 2 (two) times daily.    . iron polysaccharides (NU-IRON) 150 MG capsule Take 1 capsule (150 mg total) by mouth daily. 30 capsule 0  . latanoprost (XALATAN) 0.005 % ophthalmic solution Place 1 drop into both eyes at bedtime.    Marland Kitchen  metFORMIN (GLUCOPHAGE) 500 MG tablet Take 500 mg by mouth 2 (two) times daily with a meal.    . ondansetron (ZOFRAN) 8 MG tablet Take 1 tablet (8 mg total) by mouth every 8 (eight) hours as needed for nausea or vomiting. 30 tablet 0  . rivaroxaban (XARELTO) 10 MG TABS tablet Take 1 tablet (10 mg total) by mouth daily with supper. 30 tablet 9  . Skin Protectants, Misc. (EUCERIN) cream Apply 1 application topically 2 (two) times daily as needed for dry skin.     Marland Kitchen zinc oxide (BALMEX) 11.3 % CREA cream Apply 1 application topically 2 (two) times daily.     No current facility-administered medications for this visit.    REVIEW OF SYSTEMS:   Constitutional: ( - ) fevers, ( - )  chills , ( - ) night sweats Eyes: ( - ) blurriness of vision, ( - ) double vision, ( - ) watery eyes Ears, nose, mouth, throat, and face: ( - ) mucositis, ( - ) sore throat Respiratory: ( - ) cough, ( - ) dyspnea, ( - ) wheezes Cardiovascular: ( - ) palpitation, ( - ) chest discomfort, ( - ) lower extremity swelling Gastrointestinal:  ( - ) nausea, ( - )  heartburn, ( - ) change in bowel habits Skin: ( - ) abnormal skin rashes Lymphatics: ( - ) new lymphadenopathy, ( - ) easy bruising Neurological: ( - ) numbness, ( - ) tingling, ( - ) new weaknesses Behavioral/Psych: ( - ) mood change, ( - ) new changes  All other systems were reviewed with the patient and are negative.  PHYSICAL EXAMINATION: ECOG PERFORMANCE STATUS: paraplegic.   Vitals:   02/13/20 1105  BP: 107/85  Pulse: (!) 108  Resp: 19  Temp: 97.7 F (36.5 C)  SpO2: 100%   Filed Weights    GENERAL: well appearing middle aged [place] American male alert, no distress and comfortable SKIN: skin color, texture, turgor are normal, no rashes or significant lesions EYES: conjunctiva are pink and non-injected, sclera clear LUNGS: clear to auscultation and percussion with normal breathing effort HEART: regular rate & rhythm and no murmurs and no lower extremity edema Musculoskeletal: no cyanosis of digits and no clubbing  PSYCH: alert & oriented x 3, fluent speech NEURO: paraplegic, no use of LE bilaterally.   LABORATORY DATA:  I have reviewed the data as listed CBC Latest Ref Rng & Units 02/13/2020 02/06/2020 01/30/2020  WBC 4.0 - 10.5 K/uL 7.7 6.5 4.1  Hemoglobin 13.0 - 17.0 g/dL 10.7(L) 10.7(L) 10.2(L)  Hematocrit 39 - 52 % 30.6(L) 31.1(L) 29.6(L)  Platelets 150 - 400 K/uL 225 289 413(H)    CMP Latest Ref Rng & Units 02/13/2020 02/06/2020 01/30/2020  Glucose 70 - 99 mg/dL 98 132(H) 98  BUN 8 - 23 mg/dL _0 Creatinine 0.61 - 1.24 mg/dL 0.80 0.81 0.81  Sodium 135 - 145 mmol/L 141 142 140  Potassium 3.5 - 5.1 mmol/L 4.0 4.5 4.1  Chloride 98 - 111 mmol/L 104 104 104  CO2 22 - 32 mmol/L _1 Calcium 8.9 - 10.3 mg/dL 10.1 9.8 9.7  Total Protein 6.5 - 8.1 g/dL 7.3 7.2 7.6  Total Bilirubin 0.3 - 1.2 mg/dL 0.6 0.5 0.5  Alkaline Phos 38 - 126 U/L 122 106 138(H)  AST 15 - 41 U/L 19 12(L) 34  ALT 0 - 44 U/L 30 23 47(H)    Lab Results  Component Value Date   MPROTEIN  2.5 (H) 12/18/2019   MPROTEIN 1.9 (H) 09/24/2019   MPROTEIN 0.7 (H) 09/17/2018   Lab Results  Component Value Date   KPAFRELGTCHN 9.5 12/18/2019   KPAFRELGTCHN 11.9 09/24/2019   KPAFRELGTCHN 22.7 (H) 09/17/2018   LAMBDASER 216.8 (H) 12/18/2019   LAMBDASER 158.5 (H) 09/24/2019   LAMBDASER 42.0 (H) 09/17/2018   KAPLAMBRATIO 3.53 12/29/2019   KAPLAMBRATIO 0.04 (L) 12/18/2019   KAPLAMBRATIO 0.08 (L) 09/24/2019    RADIOGRAPHIC STUDIES: No results found.  ASSESSMENT & PLAN FREDIE MAJANO 64 y.o. male with medical history significant for  IgG Lambda Multiple Myeloma who presents for a follow up visit.  After review of the labs, discussion with the patient, and reviewed the imaging his findings are most consistent with a relapsed multiple myeloma.The patient has had excellent success before with treatment of his myeloma with Velcade, Revlimid, and dexamethasone.  On exam today Mr. Azevedo appears to return to his baseline.  He notes that he is no longer having loose watery stools occurring frequently in his ostomy bag and his energy levels have improved.  He is not having any urinary symptoms and he has otherwise been quite well.  He is denying any neuropathy in his hands and any new bone pain.  He is agreeable and willing to continue with treatment.  Treatment regimen consists of bortezomib 1.60m/m2 subq (Day 1 and 8), daratumumab 1800 mg subq (day 1,8 and 15), and dexamethasone 20 mg PO (day 1, 8 and 15). ([employer]ningMed 2016; [id number]  #Watery Diarrhea, improved #Dehydration, improved #AKI, improved --presented on 01/21/2020 with increased watery ostomy output x 3 days with low grade temperatures (below 100.4 F) --will provide 2 L of IV NS in clinic today. Encourage increased PO intake of fluids --negative C. Diff. Recommend he start loperamide 224mq2H PRN for diarrhea (OTC medication) --urine grew P. Mirabilis and patient was treated empirically with cirpo. Culture found to be resistant,  but symptoms resolved. Possible contaminant in his foley bag. Continue to monitor.  --resumed chemotherapy, symptoms normalized. No further loose stools in ostomy bag, Cr returned to normal.   # IgG Lambda Multiple Myeloma, Relapsed (ISS Stage II) --findings are most consistent with relapsed multiple myeloma. Patient previously successfully treated with Velcade/Rev/Dex --current plan is Daratumumab/Velcade/Dex  ( started 01/09/2020)  --recollect restaging MM labs monthly to include MM panel, SFLC, and UPEP. Additionally will collect LDH and beta 2 microglobulin.  --will have patient evaluated by dentistry prior to restarting Zometa therapy. He had an episode of osteonecrosis, risk of restarting may outweight benefit.  --f/u on 02/27/2020 prior to Cycle 3 Day 15  #History of DVT --He had placement of IVC filter, remains on Xarelto. --Due to poor mobility, and lack of bleeding complications, I recommend he remain on Xarelto indefinitely. --caution if Plt count were to drop <50  # Supportive Care -- provided patient with an albuterol inhaler (for use with daratumumab) --acyclovir 40052mID for VZV prophylaxis --zofran 8mg70mH PRN for nausea/vomiting  --Zometa to be considered after dental clearance --patient recieved pretreatment RBC phenotype  No orders of the defined types were placed in this encounter.   All questions were answered. The patient knows to call the clinic with any problems, questions or concerns.  A total of more than 30 minutes were spent on this encounter and over half of that time was spent on counseling and coordination of care as outlined above.   JohnLedell Peoples Department of Hematology/Oncology [facility]  at [facility] Phone: [phone] Pager: [phone] Email: [email or web address]  02/13/2020 7:32 PM   Literature Support:  Archie Endo, Weisel K, Nooka AK, Masszi T, [place], Candelaria I, [place] V, Munder M,  Felton, Mark TM, Qi M, Schecter J, Garvin, Qin X, Deraedt W, Ahmadi T, Spencer A, Sonneveld P; [employer] Investigators. Daratumumab, Bortezomib, and Dexamethasone for Multiple Myeloma. [employer] Med. 2016 Aug 25;375(8):754-66  --among patients with relapsed or relapsed and refractory multiple myeloma, daratumumab in combination with bortezomib and dexamethasone resulted in significantly longer progression-free survival than bortezomib and dexamethasone alone and was associated with infusion-related reactions and higher rates of thrombocytopenia and neutropenia than bortezomib and dexamethasone alone.

## 2020-02-13 NOTE — Patient Instructions (Signed)
[facility] Discharge Instructions for Patients Receiving Chemotherapy  Today you received the following chemotherapy agents: darzalex faspro, velcade  To help prevent nausea and vomiting after your treatment, we encourage you to take your nausea medication   If you develop nausea and vomiting that is not controlled by your nausea medication, call the clinic.   BELOW ARE SYMPTOMS THAT SHOULD BE REPORTED IMMEDIATELY:  *FEVER GREATER THAN 100.5 F  *CHILLS WITH OR WITHOUT FEVER  NAUSEA AND VOMITING THAT IS NOT CONTROLLED WITH YOUR NAUSEA MEDICATION  *UNUSUAL SHORTNESS OF BREATH  *UNUSUAL BRUISING OR BLEEDING  TENDERNESS IN MOUTH AND THROAT WITH OR WITHOUT PRESENCE OF ULCERS  *URINARY PROBLEMS  *BOWEL PROBLEMS  UNUSUAL RASH Items with * indicate a potential emergency and should be followed up as soon as possible.  Feel free to call the clinic should you have any questions or concerns. The clinic phone number is [phone].  Please show the [facility] at check-in to the Emergency Department and triage nurse.

## 2020-02-16 LAB — KAPPA/LAMBDA LIGHT CHAINS
Kappa free light chain: 4.5 mg/L (ref 3.3–19.4)
Kappa, lambda light chain ratio: 0.26 (ref 0.26–1.65)
Lambda free light chains: 17.2 mg/L (ref 5.7–26.3)

## 2020-02-16 LAB — BETA 2 MICROGLOBULIN, SERUM: Beta-2 Microglobulin: 2.7 mg/L — ABNORMAL HIGH (ref 0.6–2.4)

## 2020-02-17 LAB — MULTIPLE MYELOMA PANEL, SERUM
Albumin SerPl Elph-Mcnc: 3.6 g/dL (ref 2.9–4.4)
Albumin/Glob SerPl: 1.2 (ref 0.7–1.7)
Alpha 1: 0.2 g/dL (ref 0.0–0.4)
Alpha2 Glob SerPl Elph-Mcnc: 1 g/dL (ref 0.4–1.0)
B-Globulin SerPl Elph-Mcnc: 0.9 g/dL (ref 0.7–1.3)
Gamma Glob SerPl Elph-Mcnc: 1 g/dL (ref 0.4–1.8)
Globulin, Total: 3.1 g/dL (ref 2.2–3.9)
IgA: 15 mg/dL — ABNORMAL LOW (ref 61–437)
IgG (Immunoglobin G), Serum: 1158 mg/dL (ref 603–1613)
IgM (Immunoglobulin M), Srm: 7 mg/dL — ABNORMAL LOW (ref 20–172)
M Protein SerPl Elph-Mcnc: 0.8 g/dL — ABNORMAL HIGH
Total Protein ELP: 6.7 g/dL (ref 6.0–8.5)

## 2020-02-18 ENCOUNTER — Telehealth (HOSPITAL_COMMUNITY): Payer: Self-pay

## 2020-02-18 NOTE — Telephone Encounter (Signed)
-----   Message from Sandi Mariscal, MD sent at 02/18/2020 12:39 PM EDT ----- Regarding: RE: Biopsy?? No Bx.  D/W Dr. Bradd Burner and pt's Oncologist.  Cathren Harsh  ----- Message ----- From: Danielle Dess Sent: 02/18/2020  10:11 AM EDT To: Ir Procedure Requests Subject: Biopsy??                                       Procedure: Bone lesion Biopsy  Dx: F/u appt with IR for large lytic lesions on bilateral femurs  Ordering: Dr. Barney Drain [phone]  Imaging: CT femur done 11/28/19  Please review.  Thanks, Lia Foyer

## 2020-02-20 ENCOUNTER — Other Ambulatory Visit: Payer: Self-pay

## 2020-02-20 ENCOUNTER — Inpatient Hospital Stay: Payer: Medicare (Managed Care)

## 2020-02-20 ENCOUNTER — Other Ambulatory Visit: Payer: Medicare (Managed Care)

## 2020-02-20 VITALS — BP 101/68 | HR 89 | Temp 98.9°F | Resp 18

## 2020-02-20 DIAGNOSIS — C9002 Multiple myeloma in relapse: Secondary | ICD-10-CM

## 2020-02-20 DIAGNOSIS — Z7189 Other specified counseling: Secondary | ICD-10-CM

## 2020-02-20 DIAGNOSIS — Z5112 Encounter for antineoplastic immunotherapy: Secondary | ICD-10-CM | POA: Diagnosis not present

## 2020-02-20 LAB — CBC WITH DIFFERENTIAL (CANCER CENTER ONLY)
Abs Immature Granulocytes: 0.02 10*3/uL (ref 0.00–0.07)
Basophils Absolute: 0 10*3/uL (ref 0.0–0.1)
Basophils Relative: 0 %
Eosinophils Absolute: 0.2 10*3/uL (ref 0.0–0.5)
Eosinophils Relative: 3 %
HCT: 30.4 % — ABNORMAL LOW (ref 39.0–52.0)
Hemoglobin: 10.7 g/dL — ABNORMAL LOW (ref 13.0–17.0)
Immature Granulocytes: 0 %
Lymphocytes Relative: 10 %
Lymphs Abs: 0.8 10*3/uL (ref 0.7–4.0)
MCH: 30.8 pg (ref 26.0–34.0)
MCHC: 35.2 g/dL (ref 30.0–36.0)
MCV: 87.6 fL (ref 80.0–100.0)
Monocytes Absolute: 0.4 10*3/uL (ref 0.1–1.0)
Monocytes Relative: 6 %
Neutro Abs: 6.1 10*3/uL (ref 1.7–7.7)
Neutrophils Relative %: 81 %
Platelet Count: 243 10*3/uL (ref 150–400)
RBC: 3.47 MIL/uL — ABNORMAL LOW (ref 4.22–5.81)
RDW: 16.4 % — ABNORMAL HIGH (ref 11.5–15.5)
WBC Count: 7.6 10*3/uL (ref 4.0–10.5)
nRBC: 0 % (ref 0.0–0.2)

## 2020-02-20 LAB — CMP (CANCER CENTER ONLY)
ALT: 19 U/L (ref 0–44)
AST: 13 U/L — ABNORMAL LOW (ref 15–41)
Albumin: 3.4 g/dL — ABNORMAL LOW (ref 3.5–5.0)
Alkaline Phosphatase: 102 U/L (ref 38–126)
Anion gap: 10 (ref 5–15)
BUN: 16 mg/dL (ref 8–23)
CO2: 25 mmol/L (ref 22–32)
Calcium: 9.8 mg/dL (ref 8.9–10.3)
Chloride: 107 mmol/L (ref 98–111)
Creatinine: 0.8 mg/dL (ref 0.61–1.24)
GFR, Est AFR Am: >60 mL/min (ref >60)
GFR, Estimated: >60 mL/min (ref >60)
Glucose, Bld: 102 mg/dL — ABNORMAL HIGH (ref 70–99)
Potassium: 4.2 mmol/L (ref 3.5–5.1)
Sodium: 142 mmol/L (ref 135–145)
Total Bilirubin: 0.5 mg/dL (ref 0.3–1.2)
Total Protein: 7.1 g/dL (ref 6.5–8.1)

## 2020-02-20 LAB — LACTATE DEHYDROGENASE: LDH: 85 U/L — ABNORMAL LOW (ref 98–192)

## 2020-02-20 MED ORDER — DARATUMUMAB-HYALURONIDASE-FIHJ 1800-30000 MG-UT/15ML ~~LOC~~ SOLN
1800.0000 mg | Freq: Once | SUBCUTANEOUS | Status: AC
  Administered 2020-02-20: 1800 mg via SUBCUTANEOUS
  Filled 2020-02-20: qty 15

## 2020-02-20 MED ORDER — ACETAMINOPHEN 325 MG PO TABS
650.0000 mg | ORAL_TABLET | Freq: Once | ORAL | Status: AC
  Administered 2020-02-20: 650 mg via ORAL

## 2020-02-20 MED ORDER — ACETAMINOPHEN 325 MG PO TABS
ORAL_TABLET | ORAL | Status: AC
  Filled 2020-02-20: qty 2

## 2020-02-20 MED ORDER — DEXAMETHASONE 4 MG PO TABS
ORAL_TABLET | ORAL | Status: AC
  Filled 2020-02-20: qty 5

## 2020-02-20 MED ORDER — BORTEZOMIB CHEMO SQ INJECTION 3.5 MG (2.5MG/ML)
1.3000 mg/m2 | Freq: Once | INTRAMUSCULAR | Status: AC
  Administered 2020-02-20: 2.75 mg via SUBCUTANEOUS
  Filled 2020-02-20: qty 1.1

## 2020-02-20 MED ORDER — DIPHENHYDRAMINE HCL 25 MG PO CAPS
25.0000 mg | ORAL_CAPSULE | Freq: Once | ORAL | Status: AC
  Administered 2020-02-20: 25 mg via ORAL

## 2020-02-20 MED ORDER — DIPHENHYDRAMINE HCL 25 MG PO CAPS
ORAL_CAPSULE | ORAL | Status: AC
  Filled 2020-02-20: qty 2

## 2020-02-20 MED ORDER — DEXAMETHASONE 4 MG PO TABS
20.0000 mg | ORAL_TABLET | Freq: Once | ORAL | Status: AC
  Administered 2020-02-20: 20 mg via ORAL

## 2020-02-23 LAB — UPEP/UIFE/LIGHT CHAINS/TP, 24-HR UR
% BETA, Urine: 32.8 %
ALPHA 1 URINE: 4 %
Albumin, U: 31.7 %
Alpha 2, Urine: 11.8 %
Free Kappa Lt Chains,Ur: 1.06 mg/L (ref 0.63–113.79)
Free Kappa/Lambda Ratio: >1.58 (ref 1.03–31.76)
Free Lambda Lt Chains,Ur: <0.67 mg/L (ref 0.47–11.77)
GAMMA GLOBULIN URINE: 19.7 %
M-SPIKE %, Urine: 7.1 % — ABNORMAL HIGH
M-Spike, Mg/24 Hr: 53 mg/24 hr — ABNORMAL HIGH
Total Protein, Urine-Ur/day: 741 mg/24 hr — ABNORMAL HIGH (ref 30–150)
Total Protein, Urine: 38 mg/dL
Total Volume: 1950

## 2020-02-24 ENCOUNTER — Telehealth: Payer: Self-pay | Admitting: Hematology and Oncology

## 2020-02-24 ENCOUNTER — Other Ambulatory Visit: Payer: Self-pay | Admitting: Hematology and Oncology

## 2020-02-24 DIAGNOSIS — C9002 Multiple myeloma in relapse: Secondary | ICD-10-CM

## 2020-02-24 NOTE — Telephone Encounter (Signed)
[name] with transportation [phone]  Changed appt from 8/6 to 8/5 due to appt time and transportation conflict - left message.

## 2020-02-24 NOTE — Progress Notes (Signed)
[place] [phone]   [phone]) [phone]  PROGRESS NOTE  Patient Care Team: [name], MD as PCP - General (Family Medicine) [name], MD as Consulting Physician (Physical Medicine and Rehabilitation) Donia Guiles Lavon Paganini, PA-C as Physician Assistant (Physical Medicine and Rehabilitation) [name], MD as Consulting Physician (Hematology and Oncology)  Hematological/Oncological History # IgG Lambda Multiple Myeloma, Relapsed (ISS Stage II) 1) 06/2010: initial diagnosis of Multiple Myeloma after T8 compression fracture. Treated with Velcade/Revlimid/Dexamethasone and achieved a complete remission 2) Velcade was discontinued in September 2012 and that Revlimid and Decadron were discontinued in March 2013. 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 4) Followed by Dr. Alvy Bimler, last clinic visit 10/09/2019. At that time there was concern for relapse of his multiple myeloma.  5) Patient requested transfer to different provider after misunderstanding regarding imaging studies 6) 12/17/2019: transfer care to Dr. Lorenso Courier  7) 01/09/2020: Cycle 1 Day 1 of Dara/Velcade/Dex 8) 01/21/2020: presented as urgent visit for diarrhea and dehydration. Holding chemotherapy scheduled for 01/23/2020. 9) 01/30/2020: Resume dara/velcade/dex after resolution of diarrhea.  10) 02/13/2020: restaging labs show M protein 0.8, Kappa 4.5, lamba 17.2, ratio 0.26, urine M protein 53 (7.1%). All MM labs indicate improvement.   Interval History:  [name] 64 y.o. male with medical history significant for  IgG Lambda Multiple Myeloma who presents for a follow up visit. The patient's last visit was on 02/13/2020. In the interim he has had no changes in his health status.   On exam today [name] notes he feels quite well.  He reports that he has been tolerating treatment without any  difficulty.  He denies having any neuropathy in his fingers and reports that his ostomy output has returned back to normal.  He reports he has been drinking 2 x 40 ounces of water daily in order to stay hydrated.  He reports that he is also having better success with the pressure lesion on his bottom.  He denies having any nausea, vomiting, or change in the color in his urostomy bag.  He reports that his appetite has been slightly better to stable from before.  He denies having any issues with fevers, chills, sweats and overall is excited to proceed with treatment today.  A full 10 point ROS is listed below.  MEDICAL HISTORY:  Past Medical History:  Diagnosis Date  . Adrenal insufficiency (HCC)    on chronic dexamethasone  . Anemia   . Cancer ([place])   . Coagulopathy ([place])    on xeralto/ s/p DVT while on coumadin,  IVC in place  . Diabetes mellitus without complication ([place])    type 2  . Gross hematuria 7/14   post foley cath procedure  . History of blood transfusion 7/14  . Multiple myeloma    thoracic T8 with paraplegia s/p resection- on chemo at visit 10/13/10  . Multiple myeloma   . Multiple myeloma without mention of remission   . Neurogenic bladder   . Neurogenic bowel   . Paraplegia ([place])   . Partial small bowel obstruction ([place]) during dec 2011 admission    SURGICAL HISTORY: Past Surgical History:  Procedure Laterality Date  . COLONOSCOPY WITH PROPOFOL N/A 04/12/2017   Procedure: COLONOSCOPY WITH PROPOFOL;  Surgeon: Irene Shipper, MD;  Location: [facility];  Service: Endoscopy;  Laterality: N/A;  . COLONOSCOPY WITH PROPOFOL N/A 04/19/2017   Procedure: COLONOSCOPY WITH  PROPOFOL;  Surgeon: Yetta Flock, MD;  Location: [facility];  Service: Gastroenterology;  Laterality: N/A;  . COLOSTOMY  07/20/2011   Procedure: COLOSTOMY;  Surgeon: Judieth Keens, DO;  Location: [facility] OR;  Service: General;;  . COLOSTOMY REVISION  07/20/2011   Procedure: COLON RESECTION SIGMOID;   Surgeon: Judieth Keens, DO;  Location: [facility];  Service: General;;  . CYSTOSCOPY N/A 04/04/2013   Procedure: CYSTOSCOPY WITH LITHALOPAXY;  Surgeon: Alexis Frock, MD;  Location: [facility] ORS;  Service: Urology;  Laterality: N/A;  . INSERTION OF SUPRAPUBIC CATHETER N/A 04/04/2013   Procedure: INSERTION OF SUPRAPUBIC CATHETER;  Surgeon: Alexis Frock, MD;  Location: [facility];  Service: Urology;  Laterality: N/A;  . LAPAROTOMY  07/20/2011   Procedure: EXPLORATORY LAPAROTOMY;  Surgeon: Judieth Keens, DO;  Location: [facility];  Service: General;  Laterality: N/A;  . myeloma thoracic T8 with parpaplegia s/p thoracotomy and thoracic T7-9 cage placement on Dec 26th 2011  07/18/10    SOCIAL HISTORY: Social History   Socioeconomic History  . Marital status: Married    Spouse name: Not on file  . Number of children: Not on file  . Years of education: Not on file  . Highest education level: Not on file  Occupational History  . Not on file  Tobacco Use  . Smoking status: Never Smoker  . Smokeless tobacco: Never Used  Vaping Use  . Vaping Use: Never used  Substance and Sexual Activity  . Alcohol use: No  . Drug use: No  . Sexual activity: Never  Other Topics Concern  . Not on file  Social History Narrative  . Not on file   Social Determinants of Health   Financial Resource Strain:   . Difficulty of Paying Living Expenses:   Food Insecurity:   . Worried About Charity fundraiser in the Last Year:   . Arboriculturist in the Last Year:   Transportation Needs:   . Film/video editor (Medical):   Marland Kitchen Lack of Transportation (Non-Medical):   Physical Activity:   . Days of Exercise per Week:   . Minutes of Exercise per Session:   Stress:   . Feeling of Stress :   Social Connections:   . Frequency of Communication with Friends and Family:   . Frequency of Social Gatherings with Friends and Family:   . Attends Religious Services:   . Active Member of Clubs or Organizations:   . Attends  Archivist Meetings:   Marland Kitchen Marital Status:   Intimate Partner Violence:   . Fear of Current or Ex-Partner:   . Emotionally Abused:   Marland Kitchen Physically Abused:   . Sexually Abused:     FAMILY HISTORY: Family History  Problem Relation Age of Onset  . Cancer Mother   . Ovarian cancer Mother   . Diabetes Father     ALLERGIES:  is allergic to feraheme [ferumoxytol].  MEDICATIONS:  Current Outpatient Medications  Medication Sig Dispense Refill  . acyclovir (ZOVIRAX) 400 MG tablet Take 1 tablet (400 mg total) by mouth 2 (two) times daily. 60 tablet 2  . baclofen (LIORESAL) 20 MG tablet Take 20 mg by mouth 2 (two) times daily.    . brinzolamide (AZOPT) 1 % ophthalmic suspension Place 1 drop into both eyes 2 (two) times daily.    . dorzolamide (TRUSOPT) 2 % ophthalmic solution 1 drop 2 (two) times daily.    . iron polysaccharides (NU-IRON) 150 MG capsule Take 1 capsule (  150 mg total) by mouth daily. 30 capsule 0  . latanoprost (XALATAN) 0.005 % ophthalmic solution Place 1 drop into both eyes at bedtime.    . metFORMIN (GLUCOPHAGE) 500 MG tablet Take 500 mg by mouth 2 (two) times daily with a meal.    . ondansetron (ZOFRAN) 8 MG tablet Take 1 tablet (8 mg total) by mouth every 8 (eight) hours as needed for nausea or vomiting. 30 tablet 0  . rivaroxaban (XARELTO) 10 MG TABS tablet Take 1 tablet (10 mg total) by mouth daily with supper. 30 tablet 9  . Skin Protectants, Misc. (EUCERIN) cream Apply 1 application topically 2 (two) times daily as needed for dry skin.     Marland Kitchen zinc oxide (BALMEX) 11.3 % CREA cream Apply 1 application topically 2 (two) times daily.     No current facility-administered medications for this visit.    REVIEW OF SYSTEMS:   Constitutional: ( - ) fevers, ( - )  chills , ( - ) night sweats Eyes: ( - ) blurriness of vision, ( - ) double vision, ( - ) watery eyes Ears, nose, mouth, throat, and face: ( - ) mucositis, ( - ) sore throat Respiratory: ( - ) cough, ( - )  dyspnea, ( - ) wheezes Cardiovascular: ( - ) palpitation, ( - ) chest discomfort, ( - ) lower extremity swelling Gastrointestinal:  ( - ) nausea, ( - ) heartburn, ( - ) change in bowel habits Skin: ( - ) abnormal skin rashes Lymphatics: ( - ) new lymphadenopathy, ( - ) easy bruising Neurological: ( - ) numbness, ( - ) tingling, ( - ) new weaknesses Behavioral/Psych: ( - ) mood change, ( - ) new changes  All other systems were reviewed with the patient and are negative.  PHYSICAL EXAMINATION: ECOG PERFORMANCE STATUS: paraplegic.   Vitals:   02/26/20 1340  BP: 116/78  Pulse: 93  Resp: 17  Temp: (!) 97.3 F (36.3 C)  SpO2: 100%   Filed Weights   02/26/20 1340  Weight: 212 lb 12.8 oz (96.5 kg)    GENERAL: well appearing middle aged [place] American male alert, no distress and comfortable SKIN: skin color, texture, turgor are normal, no rashes or significant lesions EYES: conjunctiva are pink and non-injected, sclera clear LUNGS: clear to auscultation and percussion with normal breathing effort HEART: regular rate & rhythm and no murmurs and no lower extremity edema Musculoskeletal: no cyanosis of digits and no clubbing  PSYCH: alert & oriented x 3, fluent speech NEURO: paraplegic, no use of LE bilaterally.   LABORATORY DATA:  I have reviewed the data as listed CBC Latest Ref Rng & Units 02/26/2020 02/20/2020 02/13/2020  WBC 4.0 - 10.5 K/uL 9.1 7.6 7.7  Hemoglobin 13.0 - 17.0 g/dL 10.8(L) 10.7(L) 10.7(L)  Hematocrit 39 - 52 % 31.0(L) 30.4(L) 30.6(L)  Platelets 150 - 400 K/uL 299 243 225    CMP Latest Ref Rng & Units 02/26/2020 02/20/2020 02/13/2020  Glucose 70 - 99 mg/dL 81 102(H) 98  BUN 8 - 23 mg/dL _0 Creatinine 0.61 - 1.24 mg/dL 0.71 0.80 0.80  Sodium 135 - 145 mmol/L 142 142 141  Potassium 3.5 - 5.1 mmol/L 3.7 4.2 4.0  Chloride 98 - 111 mmol/L 105 107 104  CO2 22 - 32 mmol/L _1 Calcium 8.9 - 10.3 mg/dL 10.1 9.8 10.1  Total Protein 6.5 - 8.1 g/dL 7.1 7.1 7.3    Total Bilirubin 0.3 - 1.2 mg/dL 0.3  0.5 0.6  Alkaline Phos 38 - 126 U/L 98 102 122  AST 15 - 41 U/L 16 13(L) 19  ALT 0 - 44 U/L _0 Lab Results  Component Value Date   MPROTEIN 0.8 (H) 02/13/2020   MPROTEIN 2.5 (H) 12/18/2019   MPROTEIN 1.9 (H) 09/24/2019   Lab Results  Component Value Date   KPAFRELGTCHN 4.5 02/13/2020   KPAFRELGTCHN 9.5 12/18/2019   KPAFRELGTCHN 11.9 09/24/2019   LAMBDASER 17.2 02/13/2020   LAMBDASER 216.8 (H) 12/18/2019   LAMBDASER 158.5 (H) 09/24/2019   KAPLAMBRATIO >1.58 02/20/2020   KAPLAMBRATIO 0.26 02/13/2020   KAPLAMBRATIO 3.53 12/29/2019    RADIOGRAPHIC STUDIES: No results found.  ASSESSMENT & PLAN [name] 64 y.o. male with medical history significant for  IgG Lambda Multiple Myeloma who presents for a follow up visit.  After review of the labs, discussion with the patient, and reviewed the imaging his findings are most consistent with a relapsed multiple myeloma.The patient has had excellent success before with treatment of his myeloma with Velcade, Revlimid, and dexamethasone.  On exam today Mr. Mccarthy reports that he is doing well.  He is not having any symptoms as a result of his chemotherapy treatment and has returned to baseline levels of ostomy and suprapubic catheter output.  He is denying any neuropathy in his hands and any new bone pain.  He is agreeable and willing to continue with treatment.  Additionally his multiple myeloma labs from 02/13/2020 show marked improvement in his serum free light chains and M protein, strongly implying he is having a good response to therapy.   Treatment regimen consists of bortezomib 1.17m/m2 subq (Day 1 and 8), daratumumab 1800 mg subq (day 1,8 and 15), and dexamethasone 20 mg PO (day 1, 8 and 15). ([employer]ningMed 2016; [id number]   # IgG Lambda Multiple Myeloma, Relapsed (ISS Stage II) --findings are most consistent with relapsed multiple myeloma. Patient previously successfully treated with  Velcade/Rev/Dex --current plan is Daratumumab/Velcade/Dex  ( started 01/09/2020)  --recollect restaging MM labs monthly to include MM panel, SFLC, and UPEP. Additionally will collect LDH and beta 2 microglobulin.  --will have patient evaluated by dentistry prior to restarting Zometa therapy. He had an episode of osteonecrosis in the past, risk of restarting may outweight benefit.  --f/u on 03/18/2020 ( 3 weeks f/u)   #History of DVT --He had placement of IVC filter, remains on Xarelto. --Due to poor mobility, and lack of bleeding complications, I recommend he remain on Xarelto indefinitely. --caution if Plt count were to drop <50  #Watery Diarrhea, resolved #Dehydration, resolved #AKI, resolved --resolved, continue to monitor   # Supportive Care -- provided patient with an albuterol inhaler (for use with daratumumab) --acyclovir 4029mBID for VZV prophylaxis --zofran 81m42m8H PRN for nausea/vomiting  --Zometa to be considered after dental clearance --patient recieved pretreatment RBC phenotype  No orders of the defined types were placed in this encounter.   All questions were answered. The patient knows to call the clinic with any problems, questions or concerns.  A total of more than 30 minutes were spent on this encounter and over half of that time was spent on counseling and coordination of care as outlined above.   JohLedell PeoplesD Department of Hematology/Oncology [employer] [facility]: [phone]: [phone]: [email or web address]  02/26/2020 4:12 PM   Literature Support:  PalCurlene Labrumhanan-Khan A, Weisel K, Nooka AK, Masszi T,  Beksac M, Spicka I, Lavonia Dana, Munder M, [place], [address] TM, Qi M, Schecter J, [place], Qin X, Deraedt W, Ahmadi T, Spencer A, Sonneveld P; [employer] Investigators. Daratumumab, Bortezomib, and Dexamethasone for Multiple Myeloma. [employer] Med. 2016 Aug 25;375(8):754-66  --among patients with  relapsed or relapsed and refractory multiple myeloma, daratumumab in combination with bortezomib and dexamethasone resulted in significantly longer progression-free survival than bortezomib and dexamethasone alone and was associated with infusion-related reactions and higher rates of thrombocytopenia and neutropenia than bortezomib and dexamethasone alone.

## 2020-02-26 ENCOUNTER — Inpatient Hospital Stay: Payer: Medicare (Managed Care) | Attending: Hematology and Oncology

## 2020-02-26 ENCOUNTER — Inpatient Hospital Stay (HOSPITAL_BASED_OUTPATIENT_CLINIC_OR_DEPARTMENT_OTHER): Payer: Medicare (Managed Care) | Admitting: Hematology and Oncology

## 2020-02-26 ENCOUNTER — Other Ambulatory Visit: Payer: Self-pay

## 2020-02-26 ENCOUNTER — Encounter: Payer: Self-pay | Admitting: Hematology and Oncology

## 2020-02-26 ENCOUNTER — Inpatient Hospital Stay: Payer: Medicare (Managed Care)

## 2020-02-26 VITALS — BP 116/78 | HR 93 | Temp 97.3°F | Resp 17 | Ht 68.0 in | Wt 212.8 lb

## 2020-02-26 DIAGNOSIS — Z7901 Long term (current) use of anticoagulants: Secondary | ICD-10-CM

## 2020-02-26 DIAGNOSIS — Z7189 Other specified counseling: Secondary | ICD-10-CM

## 2020-02-26 DIAGNOSIS — E119 Type 2 diabetes mellitus without complications: Secondary | ICD-10-CM | POA: Diagnosis not present

## 2020-02-26 DIAGNOSIS — C9002 Multiple myeloma in relapse: Secondary | ICD-10-CM

## 2020-02-26 DIAGNOSIS — G8220 Paraplegia, unspecified: Secondary | ICD-10-CM | POA: Diagnosis not present

## 2020-02-26 DIAGNOSIS — Z79899 Other long term (current) drug therapy: Secondary | ICD-10-CM | POA: Diagnosis not present

## 2020-02-26 DIAGNOSIS — E2740 Unspecified adrenocortical insufficiency: Secondary | ICD-10-CM | POA: Diagnosis not present

## 2020-02-26 DIAGNOSIS — M87180 Osteonecrosis due to drugs, jaw: Secondary | ICD-10-CM | POA: Diagnosis not present

## 2020-02-26 DIAGNOSIS — Z86718 Personal history of other venous thrombosis and embolism: Secondary | ICD-10-CM | POA: Insufficient documentation

## 2020-02-26 DIAGNOSIS — Z5111 Encounter for antineoplastic chemotherapy: Secondary | ICD-10-CM | POA: Diagnosis present

## 2020-02-26 DIAGNOSIS — Z5112 Encounter for antineoplastic immunotherapy: Secondary | ICD-10-CM | POA: Diagnosis present

## 2020-02-26 LAB — CBC WITH DIFFERENTIAL (CANCER CENTER ONLY)
Abs Immature Granulocytes: 0.03 10*3/uL (ref 0.00–0.07)
Basophils Absolute: 0 10*3/uL (ref 0.0–0.1)
Basophils Relative: 0 %
Eosinophils Absolute: 0.1 10*3/uL (ref 0.0–0.5)
Eosinophils Relative: 2 %
HCT: 31 % — ABNORMAL LOW (ref 39.0–52.0)
Hemoglobin: 10.8 g/dL — ABNORMAL LOW (ref 13.0–17.0)
Immature Granulocytes: 0 %
Lymphocytes Relative: 12 %
Lymphs Abs: 1.1 10*3/uL (ref 0.7–4.0)
MCH: 30.2 pg (ref 26.0–34.0)
MCHC: 34.8 g/dL (ref 30.0–36.0)
MCV: 86.6 fL (ref 80.0–100.0)
Monocytes Absolute: 0.6 10*3/uL (ref 0.1–1.0)
Monocytes Relative: 6 %
Neutro Abs: 7.2 10*3/uL (ref 1.7–7.7)
Neutrophils Relative %: 80 %
Platelet Count: 299 10*3/uL (ref 150–400)
RBC: 3.58 MIL/uL — ABNORMAL LOW (ref 4.22–5.81)
RDW: 16.2 % — ABNORMAL HIGH (ref 11.5–15.5)
WBC Count: 9.1 10*3/uL (ref 4.0–10.5)
nRBC: 0 % (ref 0.0–0.2)

## 2020-02-26 LAB — CMP (CANCER CENTER ONLY)
ALT: 19 U/L (ref 0–44)
AST: 16 U/L (ref 15–41)
Albumin: 3.5 g/dL (ref 3.5–5.0)
Alkaline Phosphatase: 98 U/L (ref 38–126)
Anion gap: 10 (ref 5–15)
BUN: 16 mg/dL (ref 8–23)
CO2: 27 mmol/L (ref 22–32)
Calcium: 10.1 mg/dL (ref 8.9–10.3)
Chloride: 105 mmol/L (ref 98–111)
Creatinine: 0.71 mg/dL (ref 0.61–1.24)
GFR, Est AFR Am: >60 mL/min (ref >60)
GFR, Estimated: >60 mL/min (ref >60)
Glucose, Bld: 81 mg/dL (ref 70–99)
Potassium: 3.7 mmol/L (ref 3.5–5.1)
Sodium: 142 mmol/L (ref 135–145)
Total Bilirubin: 0.3 mg/dL (ref 0.3–1.2)
Total Protein: 7.1 g/dL (ref 6.5–8.1)

## 2020-02-26 LAB — LACTATE DEHYDROGENASE: LDH: 89 U/L — ABNORMAL LOW (ref 98–192)

## 2020-02-26 MED ORDER — DIPHENHYDRAMINE HCL 25 MG PO CAPS
25.0000 mg | ORAL_CAPSULE | Freq: Once | ORAL | Status: AC
  Administered 2020-02-26: 25 mg via ORAL

## 2020-02-26 MED ORDER — ACETAMINOPHEN 325 MG PO TABS
ORAL_TABLET | ORAL | Status: AC
  Filled 2020-02-26: qty 2

## 2020-02-26 MED ORDER — DARATUMUMAB-HYALURONIDASE-FIHJ 1800-30000 MG-UT/15ML ~~LOC~~ SOLN
1800.0000 mg | Freq: Once | SUBCUTANEOUS | Status: AC
  Administered 2020-02-26: 1800 mg via SUBCUTANEOUS
  Filled 2020-02-26: qty 15

## 2020-02-26 MED ORDER — DEXAMETHASONE 4 MG PO TABS
ORAL_TABLET | ORAL | Status: AC
  Filled 2020-02-26: qty 5

## 2020-02-26 MED ORDER — DIPHENHYDRAMINE HCL 25 MG PO CAPS
ORAL_CAPSULE | ORAL | Status: AC
  Filled 2020-02-26: qty 1

## 2020-02-26 MED ORDER — DEXAMETHASONE 4 MG PO TABS
20.0000 mg | ORAL_TABLET | Freq: Once | ORAL | Status: AC
  Administered 2020-02-26: 20 mg via ORAL

## 2020-02-26 MED ORDER — BORTEZOMIB CHEMO SQ INJECTION 3.5 MG (2.5MG/ML)
1.3000 mg/m2 | Freq: Once | INTRAMUSCULAR | Status: AC
  Administered 2020-02-26: 2.75 mg via SUBCUTANEOUS
  Filled 2020-02-26: qty 1.1

## 2020-02-26 MED ORDER — ACETAMINOPHEN 325 MG PO TABS
650.0000 mg | ORAL_TABLET | Freq: Once | ORAL | Status: AC
  Administered 2020-02-26: 650 mg via ORAL

## 2020-02-26 NOTE — Patient Instructions (Signed)
[facility] Discharge Instructions for Patients Receiving Chemotherapy  Today you received the following chemotherapy agents: Velcade, Darzalex  To help prevent nausea and vomiting after your treatment, we encourage you to take your nausea medication as directed.   If you develop nausea and vomiting that is not controlled by your nausea medication, call the clinic.   BELOW ARE SYMPTOMS THAT SHOULD BE REPORTED IMMEDIATELY:  *FEVER GREATER THAN 100.5 F  *CHILLS WITH OR WITHOUT FEVER  NAUSEA AND VOMITING THAT IS NOT CONTROLLED WITH YOUR NAUSEA MEDICATION  *UNUSUAL SHORTNESS OF BREATH  *UNUSUAL BRUISING OR BLEEDING  TENDERNESS IN MOUTH AND THROAT WITH OR WITHOUT PRESENCE OF ULCERS  *URINARY PROBLEMS  *BOWEL PROBLEMS  UNUSUAL RASH Items with * indicate a potential emergency and should be followed up as soon as possible.  Feel free to call the clinic should you have any questions or concerns. The clinic phone number is [phone].  Please show the [place] at check-in to the Emergency Department and triage nurse.

## 2020-02-27 ENCOUNTER — Other Ambulatory Visit: Payer: Medicare (Managed Care)

## 2020-02-27 ENCOUNTER — Inpatient Hospital Stay: Payer: Medicare (Managed Care)

## 2020-02-27 ENCOUNTER — Inpatient Hospital Stay: Payer: Medicare (Managed Care) | Admitting: Hematology and Oncology

## 2020-03-03 ENCOUNTER — Telehealth: Payer: Self-pay | Admitting: Hematology and Oncology

## 2020-03-03 NOTE — Telephone Encounter (Signed)
Scheduled add on appt per 8/6 staff message - called wife and wife told me to call [facility] - called and requested transportation, left message for transportation with appt date and time

## 2020-03-04 ENCOUNTER — Encounter: Payer: Self-pay | Admitting: Orthopedic Surgery

## 2020-03-04 ENCOUNTER — Ambulatory Visit (INDEPENDENT_AMBULATORY_CARE_PROVIDER_SITE_OTHER): Payer: Medicare (Managed Care) | Admitting: Orthopedic Surgery

## 2020-03-04 ENCOUNTER — Inpatient Hospital Stay: Payer: Medicare (Managed Care)

## 2020-03-04 VITALS — Ht 68.0 in | Wt 212.0 lb

## 2020-03-04 DIAGNOSIS — M899 Disorder of bone, unspecified: Secondary | ICD-10-CM | POA: Diagnosis not present

## 2020-03-05 ENCOUNTER — Inpatient Hospital Stay: Payer: Medicare (Managed Care)

## 2020-03-05 ENCOUNTER — Other Ambulatory Visit: Payer: Self-pay | Admitting: Hematology and Oncology

## 2020-03-05 ENCOUNTER — Other Ambulatory Visit: Payer: Self-pay

## 2020-03-05 VITALS — BP 104/65 | HR 100 | Temp 98.1°F | Resp 18

## 2020-03-05 DIAGNOSIS — C9002 Multiple myeloma in relapse: Secondary | ICD-10-CM

## 2020-03-05 DIAGNOSIS — Z7189 Other specified counseling: Secondary | ICD-10-CM

## 2020-03-05 LAB — CMP (CANCER CENTER ONLY)
ALT: 10 U/L (ref 0–44)
AST: 10 U/L — ABNORMAL LOW (ref 15–41)
Albumin: 3.3 g/dL — ABNORMAL LOW (ref 3.5–5.0)
Alkaline Phosphatase: 86 U/L (ref 38–126)
Anion gap: 9 (ref 5–15)
BUN: 16 mg/dL (ref 8–23)
CO2: 24 mmol/L (ref 22–32)
Calcium: 9.8 mg/dL (ref 8.9–10.3)
Chloride: 107 mmol/L (ref 98–111)
Creatinine: 0.76 mg/dL (ref 0.61–1.24)
GFR, Est AFR Am: >60 mL/min (ref >60)
GFR, Estimated: >60 mL/min (ref >60)
Glucose, Bld: 90 mg/dL (ref 70–99)
Potassium: 3.6 mmol/L (ref 3.5–5.1)
Sodium: 140 mmol/L (ref 135–145)
Total Bilirubin: 0.4 mg/dL (ref 0.3–1.2)
Total Protein: 6.8 g/dL (ref 6.5–8.1)

## 2020-03-05 LAB — CBC WITH DIFFERENTIAL (CANCER CENTER ONLY)
Abs Immature Granulocytes: 0.02 10*3/uL (ref 0.00–0.07)
Basophils Absolute: 0 10*3/uL (ref 0.0–0.1)
Basophils Relative: 1 %
Eosinophils Absolute: 0.2 10*3/uL (ref 0.0–0.5)
Eosinophils Relative: 3 %
HCT: 30.8 % — ABNORMAL LOW (ref 39.0–52.0)
Hemoglobin: 10.9 g/dL — ABNORMAL LOW (ref 13.0–17.0)
Immature Granulocytes: 0 %
Lymphocytes Relative: 13 %
Lymphs Abs: 1 10*3/uL (ref 0.7–4.0)
MCH: 30.7 pg (ref 26.0–34.0)
MCHC: 35.4 g/dL (ref 30.0–36.0)
MCV: 86.8 fL (ref 80.0–100.0)
Monocytes Absolute: 0.5 10*3/uL (ref 0.1–1.0)
Monocytes Relative: 6 %
Neutro Abs: 6 10*3/uL (ref 1.7–7.7)
Neutrophils Relative %: 77 %
Platelet Count: 328 10*3/uL (ref 150–400)
RBC: 3.55 MIL/uL — ABNORMAL LOW (ref 4.22–5.81)
RDW: 15.9 % — ABNORMAL HIGH (ref 11.5–15.5)
WBC Count: 7.7 10*3/uL (ref 4.0–10.5)
nRBC: 0 % (ref 0.0–0.2)

## 2020-03-05 LAB — LACTATE DEHYDROGENASE: LDH: 92 U/L — ABNORMAL LOW (ref 98–192)

## 2020-03-05 MED ORDER — DIPHENHYDRAMINE HCL 25 MG PO CAPS
ORAL_CAPSULE | ORAL | Status: AC
  Filled 2020-03-05: qty 1

## 2020-03-05 MED ORDER — ACETAMINOPHEN 325 MG PO TABS
650.0000 mg | ORAL_TABLET | Freq: Once | ORAL | Status: AC
  Administered 2020-03-05: 650 mg via ORAL

## 2020-03-05 MED ORDER — BORTEZOMIB CHEMO SQ INJECTION 3.5 MG (2.5MG/ML)
1.3000 mg/m2 | Freq: Once | INTRAMUSCULAR | Status: AC
  Administered 2020-03-05: 2.75 mg via SUBCUTANEOUS
  Filled 2020-03-05: qty 1.1

## 2020-03-05 MED ORDER — DARATUMUMAB-HYALURONIDASE-FIHJ 1800-30000 MG-UT/15ML ~~LOC~~ SOLN
1800.0000 mg | Freq: Once | SUBCUTANEOUS | Status: AC
  Administered 2020-03-05: 1800 mg via SUBCUTANEOUS
  Filled 2020-03-05: qty 15

## 2020-03-05 MED ORDER — DEXAMETHASONE 4 MG PO TABS
ORAL_TABLET | ORAL | Status: AC
  Filled 2020-03-05: qty 5

## 2020-03-05 MED ORDER — ACETAMINOPHEN 325 MG PO TABS
ORAL_TABLET | ORAL | Status: AC
  Filled 2020-03-05: qty 2

## 2020-03-05 MED ORDER — DIPHENHYDRAMINE HCL 25 MG PO CAPS
25.0000 mg | ORAL_CAPSULE | Freq: Once | ORAL | Status: AC
  Administered 2020-03-05: 25 mg via ORAL

## 2020-03-05 MED ORDER — DEXAMETHASONE 4 MG PO TABS
20.0000 mg | ORAL_TABLET | Freq: Once | ORAL | Status: AC
  Administered 2020-03-05: 20 mg via ORAL

## 2020-03-05 NOTE — Patient Instructions (Addendum)
[name] Discharge Instructions for Patients Receiving Chemotherapy  Today you received the following chemotherapy agents: Bortezomib (VELCADE) & Daratumumab-hyaluronidase (DARZALEC FASPRO).  To help prevent nausea and vomiting after your treatment, we encourage you to take your nausea medication as directed.   If you develop nausea and vomiting that is not controlled by your nausea medication, call the clinic.   BELOW ARE SYMPTOMS THAT SHOULD BE REPORTED IMMEDIATELY:  *FEVER GREATER THAN 100.5 F  *CHILLS WITH OR WITHOUT FEVER  NAUSEA AND VOMITING THAT IS NOT CONTROLLED WITH YOUR NAUSEA MEDICATION  *UNUSUAL SHORTNESS OF BREATH  *UNUSUAL BRUISING OR BLEEDING  TENDERNESS IN MOUTH AND THROAT WITH OR WITHOUT PRESENCE OF ULCERS  *URINARY PROBLEMS  *BOWEL PROBLEMS  UNUSUAL RASH Items with * indicate a potential emergency and should be followed up as soon as possible.  Feel free to call the clinic should you have any questions or concerns. The clinic phone number is [phone].  Please show the [place] at check-in to the Emergency Department and triage nurse.

## 2020-03-08 LAB — UPEP/UIFE/LIGHT CHAINS/TP, 24-HR UR
% BETA, Urine: 29.5 %
ALPHA 1 URINE: 1.1 %
Albumin, U: 51.1 %
Alpha 2, Urine: 5.5 %
Free Kappa Lt Chains,Ur: <0.28 mg/L — ABNORMAL LOW (ref 0.63–113.79)
Free Kappa/Lambda Ratio: UNDETERMINED
Free Lambda Lt Chains,Ur: <0.67 mg/L (ref 0.47–11.77)
GAMMA GLOBULIN URINE: 12.7 %
Total Protein, Urine-Ur/day: 1367 mg/24 hr — ABNORMAL HIGH (ref 30–150)
Total Protein, Urine: 63.3 mg/dL
Total Volume: 2160

## 2020-03-11 ENCOUNTER — Other Ambulatory Visit: Payer: Self-pay

## 2020-03-11 ENCOUNTER — Inpatient Hospital Stay: Payer: Medicare (Managed Care)

## 2020-03-11 ENCOUNTER — Telehealth: Payer: Self-pay | Admitting: *Deleted

## 2020-03-11 VITALS — BP 111/71 | HR 86 | Temp 98.8°F | Resp 16

## 2020-03-11 DIAGNOSIS — Z7189 Other specified counseling: Secondary | ICD-10-CM

## 2020-03-11 DIAGNOSIS — C9002 Multiple myeloma in relapse: Secondary | ICD-10-CM | POA: Diagnosis not present

## 2020-03-11 LAB — CMP (CANCER CENTER ONLY)
ALT: 12 U/L (ref 0–44)
AST: 12 U/L — ABNORMAL LOW (ref 15–41)
Albumin: 3.6 g/dL (ref 3.5–5.0)
Alkaline Phosphatase: 84 U/L (ref 38–126)
Anion gap: 9 (ref 5–15)
BUN: 19 mg/dL (ref 8–23)
CO2: 29 mmol/L (ref 22–32)
Calcium: 10.6 mg/dL — ABNORMAL HIGH (ref 8.9–10.3)
Chloride: 106 mmol/L (ref 98–111)
Creatinine: 0.76 mg/dL (ref 0.61–1.24)
GFR, Est AFR Am: >60 mL/min (ref >60)
GFR, Estimated: >60 mL/min (ref >60)
Glucose, Bld: 85 mg/dL (ref 70–99)
Potassium: 4 mmol/L (ref 3.5–5.1)
Sodium: 144 mmol/L (ref 135–145)
Total Bilirubin: 0.4 mg/dL (ref 0.3–1.2)
Total Protein: 7 g/dL (ref 6.5–8.1)

## 2020-03-11 LAB — CBC WITH DIFFERENTIAL (CANCER CENTER ONLY)
Abs Immature Granulocytes: 0.03 10*3/uL (ref 0.00–0.07)
Basophils Absolute: 0 10*3/uL (ref 0.0–0.1)
Basophils Relative: 0 %
Eosinophils Absolute: 0.1 10*3/uL (ref 0.0–0.5)
Eosinophils Relative: 2 %
HCT: 34.4 % — ABNORMAL LOW (ref 39.0–52.0)
Hemoglobin: 11.9 g/dL — ABNORMAL LOW (ref 13.0–17.0)
Immature Granulocytes: 0 %
Lymphocytes Relative: 11 %
Lymphs Abs: 1 10*3/uL (ref 0.7–4.0)
MCH: 29.6 pg (ref 26.0–34.0)
MCHC: 34.6 g/dL (ref 30.0–36.0)
MCV: 85.6 fL (ref 80.0–100.0)
Monocytes Absolute: 0.5 10*3/uL (ref 0.1–1.0)
Monocytes Relative: 6 %
Neutro Abs: 7.1 10*3/uL (ref 1.7–7.7)
Neutrophils Relative %: 81 %
Platelet Count: 351 10*3/uL (ref 150–400)
RBC: 4.02 MIL/uL — ABNORMAL LOW (ref 4.22–5.81)
RDW: 16 % — ABNORMAL HIGH (ref 11.5–15.5)
WBC Count: 8.7 10*3/uL (ref 4.0–10.5)
nRBC: 0 % (ref 0.0–0.2)

## 2020-03-11 LAB — LACTATE DEHYDROGENASE: LDH: 119 U/L (ref 98–192)

## 2020-03-11 MED ORDER — DARATUMUMAB-HYALURONIDASE-FIHJ 1800-30000 MG-UT/15ML ~~LOC~~ SOLN
1800.0000 mg | Freq: Once | SUBCUTANEOUS | Status: AC
  Administered 2020-03-11: 1800 mg via SUBCUTANEOUS
  Filled 2020-03-11: qty 15

## 2020-03-11 MED ORDER — DEXAMETHASONE 4 MG PO TABS
20.0000 mg | ORAL_TABLET | Freq: Once | ORAL | Status: AC
  Administered 2020-03-11: 20 mg via ORAL

## 2020-03-11 MED ORDER — DIPHENHYDRAMINE HCL 25 MG PO CAPS
ORAL_CAPSULE | ORAL | Status: AC
  Filled 2020-03-11: qty 1

## 2020-03-11 MED ORDER — ACETAMINOPHEN 325 MG PO TABS
650.0000 mg | ORAL_TABLET | Freq: Once | ORAL | Status: AC
  Administered 2020-03-11: 650 mg via ORAL

## 2020-03-11 MED ORDER — DIPHENHYDRAMINE HCL 25 MG PO CAPS
25.0000 mg | ORAL_CAPSULE | Freq: Once | ORAL | Status: AC
  Administered 2020-03-11: 25 mg via ORAL

## 2020-03-11 MED ORDER — DEXAMETHASONE 4 MG PO TABS
ORAL_TABLET | ORAL | Status: AC
  Filled 2020-03-11: qty 5

## 2020-03-11 MED ORDER — BORTEZOMIB CHEMO SQ INJECTION 3.5 MG (2.5MG/ML)
1.3000 mg/m2 | Freq: Once | INTRAMUSCULAR | Status: AC
  Administered 2020-03-11: 2.75 mg via SUBCUTANEOUS
  Filled 2020-03-11: qty 1.1

## 2020-03-11 MED ORDER — ACETAMINOPHEN 325 MG PO TABS
ORAL_TABLET | ORAL | Status: AC
  Filled 2020-03-11: qty 2

## 2020-03-11 NOTE — Progress Notes (Signed)
Pt. stayed 30 minutes post observation no issues noted. Left via wheelchair, no respiratory distress noted.

## 2020-03-11 NOTE — Progress Notes (Signed)
Pharmacist Chemotherapy Monitoring - Follow Up Assessment    I verify that I have reviewed each item in the below checklist:  . Regimen for the patient is scheduled for the appropriate day and plan matches scheduled date. Marland Kitchen Appropriate non-routine labs are ordered dependent on drug ordered. . If applicable, additional medications reviewed and ordered per protocol based on lifetime cumulative doses and/or treatment regimen.   Plan for follow-up and/or issues identified: Yes . I-vent associated with next due treatment: Yes . MD and/or nursing notified: Yes   Kennith Center, Pharm.D., CPP 03/11/2020@5 :28 PM

## 2020-03-11 NOTE — Patient Instructions (Signed)
[facility] Discharge Instructions for Patients Receiving Chemotherapy  Today you received the following chemotherapy agents: Bortezomib (Velcade) and Daratumumab- Hyaluronidase (Darzalex Faspro)  To help prevent nausea and vomiting after your treatment, we encourage you to take your nausea medication as directed by your MD.   If you develop nausea and vomiting that is not controlled by your nausea medication, call the clinic.   BELOW ARE SYMPTOMS THAT SHOULD BE REPORTED IMMEDIATELY:  *FEVER GREATER THAN 100.5 F  *CHILLS WITH OR WITHOUT FEVER  NAUSEA AND VOMITING THAT IS NOT CONTROLLED WITH YOUR NAUSEA MEDICATION  *UNUSUAL SHORTNESS OF BREATH  *UNUSUAL BRUISING OR BLEEDING  TENDERNESS IN MOUTH AND THROAT WITH OR WITHOUT PRESENCE OF ULCERS  *URINARY PROBLEMS  *BOWEL PROBLEMS  UNUSUAL RASH Items with * indicate a potential emergency and should be followed up as soon as possible.  Feel free to call the clinic should you have any questions or concerns. The clinic phone number is [phone].  Please show the [place] at check-in to the Emergency Department and triage nurse.

## 2020-03-11 NOTE — Telephone Encounter (Signed)
Spoke with patient in the infusion room at his request.  He states that Dr. Sharol Given would like to do surgery on his right femur (put in a rod to stabilize) but Dr. Sharol Given would like the current update on his bone status. Pt's last bone survey was 10/02/19 and last CT of femurs was done on 11/28/19. Does he need an updated scan for Dr. Sharol Given to proceed?  Please advise

## 2020-03-15 ENCOUNTER — Encounter: Payer: Self-pay | Admitting: Orthopedic Surgery

## 2020-03-15 NOTE — Progress Notes (Signed)
Office Visit Note   Patient: [name]           Date of Birth: [birth year]           MRN: [id number] Visit Date: 03/04/2020              Requested by: [name], MD [address] [place],  [place] [address] PCP: [name], MD  Chief Complaint  Patient presents with  . Left Leg - Follow-up    Multiple myeloma and lytic lesion bilateral femurs   . Right Leg - Follow-up      HPI: Patient is a 64 year old gentleman who presents in follow-up for lytic lesions bilateral femurs secondary to multiple myeloma.  Have discussed in the past the option of prophylactic femoral nails to prevent the potential for pathologic fracture.  Assessment & Plan: Visit Diagnoses:  1. Lytic bone lesion of right femur   2. Lytic bone lesion of left femur     Plan: I had a long discussion again regarding prophylactic fixation for the femurs.  With patient's improving response to his chemotherapy and the fact that patient is nonambulatory and total assist for transfers discussed that it is probably safest to follow the femurs and not proceed with surgery at this time.  Tried to obtain radiographs today but patient was pressed with time.  We will follow-up in 2 months to obtain radiographs of both femurs.  Follow-Up Instructions: No follow-ups on file.   Ortho Exam  Patient is alert, oriented, no adenopathy, well-dressed, normal affect, normal respiratory effort. Examination patient is insensate in both lower extremities and no motor function either he has no symptoms in either leg.  Discussed obtaining radiographs today to evaluate for possible increased lytic lesions of the femurs but patient did not have the time.  Imaging: No results found. No images are attached to the encounter.  Labs: Lab Results  Component Value Date   HGBA1C 6.0 (H) 07/22/2014   ESRSEDRATE 73 (H) 10/21/2013   ESRSEDRATE 55 (H) 07/23/2013   ESRSEDRATE 95 (H) 07/13/2010   CRP 1.0 (H) 07/14/2010    LABURIC 8.2 (H) 07/21/2014   LABURIC 5.1 09/02/2010   REPTSTATUS 01/25/2020 FINAL 01/21/2020   CULT (A) 01/21/2020    >=100,000 COLONIES/mL PROTEUS MIRABILIS 20,000 COLONIES/mL STAPHYLOCOCCUS AUREUS    LABORGA PROTEUS MIRABILIS (A) 01/21/2020   LABORGA STAPHYLOCOCCUS AUREUS (A) 01/21/2020     Lab Results  Component Value Date   ALBUMIN 3.6 03/11/2020   ALBUMIN 3.3 (L) 03/05/2020   ALBUMIN 3.5 02/26/2020   LABURIC 8.2 (H) 07/21/2014   LABURIC 5.1 09/02/2010    Lab Results  Component Value Date   MG 1.8 01/21/2020   MG 1.5 07/21/2014   MG 1.3 (L) 02/13/2013   Lab Results  Component Value Date   VD25OH 59 04/08/2013   VD25OH 58 01/16/2013   VD25OH 63 11/15/2012    No results found for: PREALBUMIN CBC EXTENDED Latest Ref Rng & Units 03/11/2020 03/05/2020 02/26/2020  WBC 4.0 - 10.5 K/uL 8.7 7.7 9.1  RBC 4.22 - 5.81 MIL/uL 4.02(L) 3.55(L) 3.58(L)  HGB 13.0 - 17.0 g/dL 11.9(L) 10.9(L) 10.8(L)  HCT 39 - 52 % 34.4(L) 30.8(L) 31.0(L)  PLT 150 - 400 K/uL 351 328 299  NEUTROABS 1.7 - 7.7 K/uL 7.1 6.0 7.2  LYMPHSABS 0.7 - 4.0 K/uL 1.0 1.0 1.1     Body mass index is 32.23 kg/m.  Orders:  No orders of the defined types were placed in  this encounter.  No orders of the defined types were placed in this encounter.    Procedures: No procedures performed  Clinical Data: No additional findings.  ROS:  All other systems negative, except as noted in the HPI. Review of Systems  Objective: Vital Signs: Ht 5' 8"  (1.727 m)   Wt 212 lb (96.2 kg)   BMI 32.23 kg/m   Specialty Comments:  No specialty comments available.  PMFS History: Patient Active Problem List   Diagnosis Date Noted  . Goals of care, counseling/discussion 10/09/2019  . On rivaroxaban therapy   . Status post colonoscopy with polypectomy   . Lower GI bleeding 04/18/2017  . Acute blood loss anemia 04/18/2017  . Diabetes mellitus type 2 in obese ([place]) 04/18/2017  . Colon cancer screening   . Benign  neoplasm of ascending colon   . History of DVT (deep vein thrombosis) 06/10/2015  . Obstructed suprapubic catheter ([place]) 07/21/2014  . Bilateral hydronephrosis 07/21/2014  . Acute kidney failure ([name]) 07/20/2014  . Abdominal pain 07/20/2014  . AKI (acute kidney injury) ([place]) 07/20/2014  . Hematuria 02/12/2013  . Shock ([place]) 02/12/2013  . Adrenal insufficiency ([place]) 02/12/2013  . Hypoalbuminemia 11/15/2012  . Anemia, unspecified 09/18/2012  . Unspecified vitamin D deficiency 09/18/2012  . 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 Iron deficiency anemia 09/18/2012  . Diverticulitis of large intestine with perforation 07/26/2011  . Multiple myeloma in relapse ([place]) 10/14/2010  . Neurogenic bowel 10/14/2010  . Paraplegia ([place]) 07/13/2010  . Neurogenic bladder 07/11/2010  . BACK PAIN, LUMBAR, WITH RADICULOPATHY 07/07/2010  . DISEQUILIBRIUM 07/07/2010  . Abdominal pain, generalized 07/07/2010  . OBESITY, NOS 09/20/2006   Past Medical History:  Diagnosis Date  . Adrenal insufficiency (HCC)    on chronic dexamethasone  . Anemia   . Cancer ([place])   . Coagulopathy ([place])    on xeralto/ s/p DVT while on coumadin,  IVC in place  . Diabetes mellitus without complication ([place])    type 2  . Gross hematuria 7/14   post foley cath procedure  . History of blood transfusion 7/14  . Multiple myeloma    thoracic T8 with paraplegia s/p resection- on chemo at visit 10/13/10  . Multiple myeloma   . Multiple myeloma without mention of remission   . Neurogenic bladder   . Neurogenic bowel   . Paraplegia ([place])   . Partial small bowel obstruction ([place]) during dec 2011 admission    Family History  Problem Relation Age of Onset  . Cancer Mother   . Ovarian cancer Mother   . Diabetes Father     Past Surgical History:  Procedure Laterality Date  . COLONOSCOPY WITH PROPOFOL N/A 04/12/2017   Procedure: COLONOSCOPY WITH PROPOFOL;  Surgeon: Irene Shipper, MD;  Location:  [facility];  Service: Endoscopy;  Laterality: N/A;  . COLONOSCOPY WITH PROPOFOL N/A 04/19/2017   Procedure: COLONOSCOPY WITH PROPOFOL;  Surgeon: Yetta Flock, MD;  [facility];  Service: Gastroenterology;  Laterality: N/A;  . COLOSTOMY  07/20/2011   Procedure: COLOSTOMY;  Surgeon: Judieth Keens, DO;  Location: [facility] OR;  Service: General;;  . COLOSTOMY REVISION  07/20/2011   Procedure: COLON RESECTION SIGMOID;  Surgeon: Judieth Keens, DO;  Location: [facility];  Service: General;;  . CYSTOSCOPY N/A 04/04/2013   Procedure: CYSTOSCOPY WITH LITHALOPAXY;  Surgeon: Alexis Frock, MD;  [facility];  Service: Urology;  Laterality: N/A;  . INSERTION OF SUPRAPUBIC CATHETER N/A 04/04/2013  Procedure: INSERTION OF SUPRAPUBIC CATHETER;  Surgeon: Alexis Frock, MD;  [facility];  Service: Urology;  Laterality: N/A;  . LAPAROTOMY  07/20/2011   Procedure: EXPLORATORY LAPAROTOMY;  Surgeon: Judieth Keens, DO;  Location: [facility];  Service: General;  Laterality: N/A;  . myeloma thoracic T8 with parpaplegia s/p thoracotomy and thoracic T7-9 cage placement on Dec 26th 2011  07/18/10   Social History   Occupational History  . Not on file  Tobacco Use  . Smoking status: Never Smoker  . Smokeless tobacco: Never Used  Vaping Use  . Vaping Use: Never used  Substance and Sexual Activity  . Alcohol use: No  . Drug use: No  . Sexual activity: Never

## 2020-03-16 ENCOUNTER — Other Ambulatory Visit: Payer: Self-pay | Admitting: Hematology and Oncology

## 2020-03-16 DIAGNOSIS — C9002 Multiple myeloma in relapse: Secondary | ICD-10-CM

## 2020-03-16 NOTE — Progress Notes (Signed)
[facility] [phone]   [phone]  PROGRESS NOTE  Patient Care Team: [name], MD as PCP - General (Family Medicine) [name], MD as Consulting Physician (Physical Medicine and Rehabilitation) Donia Guiles Lavon Paganini, PA-C as Physician Assistant (Physical Medicine and Rehabilitation) [name], MD as Consulting Physician (Hematology and Oncology)  Hematological/Oncological History # IgG Lambda Multiple Myeloma, Relapsed (ISS Stage II) 1) 06/2010: initial diagnosis of Multiple Myeloma after T8 compression fracture. Treated with Velcade/Revlimid/Dexamethasone and achieved a complete remission 2) Velcade was discontinued in September 2012 and that Revlimid and Decadron were discontinued in March 2013. 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 4) Followed by Dr. Alvy Bimler, last clinic visit 10/09/2019. At that time there was concern for relapse of his multiple myeloma.  5) Patient requested transfer to different provider after misunderstanding regarding imaging studies 6) 12/17/2019: transfer care to Dr. Lorenso Courier  7) 01/09/2020: Cycle 1 Day 1 of Dara/Velcade/Dex 8) 01/21/2020: presented as urgent visit for diarrhea and dehydration. Holding chemotherapy scheduled for 01/23/2020. 9) 01/30/2020: Resume dara/velcade/dex after resolution of diarrhea.  10) 02/13/2020: restaging labs show M protein 0.8, Kappa 4.5, lamba 17.2, ratio 0.26, urine M protein 53 (7.1%). All MM labs indicate improvement.  11) 03/10/2020: Cycle 4 Day 1 of Dara/Velcade/Dex. Transition to q 3 week daratumumab.   Interval History:  [name] 64 y.o. male with medical history significant for  IgG Lambda Multiple Myeloma who presents for a follow up visit. The patient's last visit was on 02/26/2020. In the interim he has had no changes in his health status.   On exam today [name]  notes he feels good.  He reports that he has not had any issues with nausea, or vomiting since our last visit.  He notes that his urine output has been good and not excessive.  He also notes that his ostomy output has been semiformed and he is not having any further profuse liquid stools.  He notes that his energy level has been good and that he would like to lose some weight.  He is down approximately 2 pounds from his last visit, but reports that he feels like he is at his baseline level of health.  He does have some questions and concerns regarding orthopedic surgery and would like [place] to connect with his surgeon Dr. Sharol Given. A full 10 point ROS is listed below.  MEDICAL HISTORY:  Past Medical History:  Diagnosis Date  . Adrenal insufficiency (HCC)    on chronic dexamethasone  . Anemia   . Cancer ([place])   . Coagulopathy ([place])    on xeralto/ s/p DVT while on coumadin,  IVC in place  . Diabetes mellitus without complication ([place])    type 2  . Gross hematuria 7/14   post foley cath procedure  . History of blood transfusion 7/14  . Multiple myeloma    thoracic T8 with paraplegia s/p resection- on chemo at visit 10/13/10  . Multiple myeloma   . Multiple myeloma without mention of remission   . Neurogenic bladder   . Neurogenic bowel   . Paraplegia ([place])   . Partial small bowel obstruction ([place]) during dec 2011 admission    SURGICAL HISTORY: Past Surgical History:  Procedure Laterality Date  . COLONOSCOPY WITH PROPOFOL N/A 04/12/2017   Procedure: COLONOSCOPY WITH PROPOFOL;  Surgeon: Irene Shipper, MD;  [facility];  Service: Endoscopy;  Laterality: N/A;  . COLONOSCOPY WITH PROPOFOL N/A 04/19/2017   Procedure: COLONOSCOPY WITH PROPOFOL;  Surgeon: Yetta Flock, MD;  [facility];  Service: Gastroenterology;  Laterality: N/A;  . COLOSTOMY  07/20/2011   Procedure: COLOSTOMY;  Surgeon: Judieth Keens, DO;  Location: [facility] OR;  Service: General;;  . COLOSTOMY REVISION   07/20/2011   Procedure: COLON RESECTION SIGMOID;  Surgeon: Judieth Keens, DO;  Location: [facility];  Service: General;;  . CYSTOSCOPY N/A 04/04/2013   Procedure: CYSTOSCOPY WITH LITHALOPAXY;  Surgeon: Alexis Frock, MD;  [facility];  Service: Urology;  Laterality: N/A;  . INSERTION OF SUPRAPUBIC CATHETER N/A 04/04/2013   Procedure: INSERTION OF SUPRAPUBIC CATHETER;  Surgeon: Alexis Frock, MD;  [facility];  Service: Urology;  Laterality: N/A;  . LAPAROTOMY  07/20/2011   Procedure: EXPLORATORY LAPAROTOMY;  Surgeon: Judieth Keens, DO;  Location: [facility];  Service: General;  Laterality: N/A;  . myeloma thoracic T8 with parpaplegia s/p thoracotomy and thoracic T7-9 cage placement on Dec 26th 2011  07/18/10    SOCIAL HISTORY: Social History   Socioeconomic History  . Marital status: Married    Spouse name: Not on file  . Number of children: Not on file  . Years of education: Not on file  . Highest education level: Not on file  Occupational History  . Not on file  Tobacco Use  . Smoking status: Never Smoker  . Smokeless tobacco: Never Used  Vaping Use  . Vaping Use: Never used  Substance and Sexual Activity  . Alcohol use: No  . Drug use: No  . Sexual activity: Never  Other Topics Concern  . Not on file  Social History Narrative  . Not on file   Social Determinants of Health   Financial Resource Strain:   . Difficulty of Paying Living Expenses: Not on file  Food Insecurity:   . Worried About Charity fundraiser in the Last Year: Not on file  . Ran Out of Food in the Last Year: Not on file  Transportation Needs:   . Lack of Transportation (Medical): Not on file  . Lack of Transportation (Non-Medical): Not on file  Physical Activity:   . Days of Exercise per Week: Not on file  . Minutes of Exercise per Session: Not on file  Stress:   . Feeling of Stress : Not on file  Social Connections:   . Frequency of Communication with Friends and Family: Not on file    . Frequency of Social Gatherings with Friends and Family: Not on file  . Attends Religious Services: Not on file  . Active Member of Clubs or Organizations: Not on file  . Attends Archivist Meetings: Not on file  . Marital Status: Not on file  Intimate Partner Violence:   . Fear of Current or Ex-Partner: Not on file  . Emotionally Abused: Not on file  . Physically Abused: Not on file  . Sexually Abused: Not on file    FAMILY HISTORY: Family History  Problem Relation Age of Onset  . Cancer Mother   . Ovarian cancer Mother   . Diabetes Father     ALLERGIES:  is allergic to feraheme [ferumoxytol].  MEDICATIONS:  Current Outpatient Medications  Medication Sig Dispense Refill  . acyclovir (ZOVIRAX) 400 MG tablet Take 1 tablet (400 mg total) by mouth 2 (two) times daily. 60 tablet 2  . baclofen (LIORESAL) 20 MG tablet Take 20 mg by mouth 2 (two)  times daily.    . brinzolamide (AZOPT) 1 % ophthalmic suspension Place 1 drop into both eyes 2 (two) times daily.    . dorzolamide (TRUSOPT) 2 % ophthalmic solution 1 drop 2 (two) times daily.    . iron polysaccharides (NU-IRON) 150 MG capsule Take 1 capsule (150 mg total) by mouth daily. 30 capsule 0  . latanoprost (XALATAN) 0.005 % ophthalmic solution Place 1 drop into both eyes at bedtime.    . metFORMIN (GLUCOPHAGE) 500 MG tablet Take 500 mg by mouth 2 (two) times daily with a meal.    . ondansetron (ZOFRAN) 8 MG tablet Take 1 tablet (8 mg total) by mouth every 8 (eight) hours as needed for nausea or vomiting. 30 tablet 0  . rivaroxaban (XARELTO) 10 MG TABS tablet Take 1 tablet (10 mg total) by mouth daily with supper. 30 tablet 9  . Skin Protectants, Misc. (EUCERIN) cream Apply 1 application topically 2 (two) times daily as needed for dry skin.     Marland Kitchen zinc oxide (BALMEX) 11.3 % CREA cream Apply 1 application topically 2 (two) times daily.     No current facility-administered medications for this visit.    REVIEW OF SYSTEMS:    Constitutional: ( - ) fevers, ( - )  chills , ( - ) night sweats Eyes: ( - ) blurriness of vision, ( - ) double vision, ( - ) watery eyes Ears, nose, mouth, throat, and face: ( - ) mucositis, ( - ) sore throat Respiratory: ( - ) cough, ( - ) dyspnea, ( - ) wheezes Cardiovascular: ( - ) palpitation, ( - ) chest discomfort, ( - ) lower extremity swelling Gastrointestinal:  ( - ) nausea, ( - ) heartburn, ( - ) change in bowel habits Skin: ( - ) abnormal skin rashes Lymphatics: ( - ) new lymphadenopathy, ( - ) easy bruising Neurological: ( - ) numbness, ( - ) tingling, ( - ) new weaknesses Behavioral/Psych: ( - ) mood change, ( - ) new changes  All other systems were reviewed with the patient and are negative.  PHYSICAL EXAMINATION: ECOG PERFORMANCE STATUS: paraplegic.   Vitals:   03/17/20 1108  BP: 118/87  Pulse: (!) 101  Resp: 17  Temp: 97.6 F (36.4 C)  SpO2: 100%   Filed Weights   03/17/20 1108  Weight: 210 lb (95.3 kg)    GENERAL: well appearing middle aged [place] American male alert, no distress and comfortable SKIN: skin color, texture, turgor are normal, no rashes or significant lesions EYES: conjunctiva are pink and non-injected, sclera clear LUNGS: clear to auscultation and percussion with normal breathing effort HEART: regular rate & rhythm and no murmurs and no lower extremity edema Musculoskeletal: no cyanosis of digits and no clubbing  PSYCH: alert & oriented x 3, fluent speech NEURO: paraplegic, no use of LE bilaterally.   LABORATORY DATA:  I have reviewed the data as listed CBC Latest Ref Rng & Units 03/17/2020 03/11/2020 03/05/2020  WBC 4.0 - 10.5 K/uL 8.0 8.7 7.7  Hemoglobin 13.0 - 17.0 g/dL 12.0(L) 11.9(L) 10.9(L)  Hematocrit 39 - 52 % 34.1(L) 34.4(L) 30.8(L)  Platelets 150 - 400 K/uL 304 351 328    CMP Latest Ref Rng & Units 03/17/2020 03/11/2020 03/05/2020  Glucose 70 - 99 mg/dL 91 85 90  BUN 8 - 23 mg/dL 23 19 16   Creatinine 0.61 - 1.24 mg/dL 0.80 0.76  0.76  Sodium 135 - 145 mmol/L 140 144 140  Potassium 3.5 - 5.1 mmol/L  4.3 4.0 3.6  Chloride 98 - 111 mmol/L 105 106 107  CO2 22 - 32 mmol/L 26 29 24   Calcium 8.9 - 10.3 mg/dL 10.5(H) 10.6(H) 9.8  Total Protein 6.5 - 8.1 g/dL 7.2 7.0 6.8  Total Bilirubin 0.3 - 1.2 mg/dL 0.5 0.4 0.4  Alkaline Phos 38 - 126 U/L 82 84 86  AST 15 - 41 U/L 12(L) 12(L) 10(L)  ALT 0 - 44 U/L 16 12 10     Lab Results  Component Value Date   MPROTEIN 0.8 (H) 02/13/2020   MPROTEIN 2.5 (H) 12/18/2019   MPROTEIN 1.9 (H) 09/24/2019   Lab Results  Component Value Date   KPAFRELGTCHN 4.5 02/13/2020   KPAFRELGTCHN 9.5 12/18/2019   KPAFRELGTCHN 11.9 09/24/2019   LAMBDASER 17.2 02/13/2020   LAMBDASER 216.8 (H) 12/18/2019   LAMBDASER 158.5 (H) 09/24/2019   KAPLAMBRATIO UNABLE TO CALCULATE 03/05/2020   KAPLAMBRATIO >1.58 02/20/2020   KAPLAMBRATIO 0.26 02/13/2020    RADIOGRAPHIC STUDIES: No results found.  ASSESSMENT & PLAN [name] 64 y.o. male with medical history significant for  IgG Lambda Multiple Myeloma who presents for a follow up visit.  After review of the labs, discussion with the patient, and reviewed the imaging his findings are most consistent with a relapsed multiple myeloma.The patient has had excellent success before with treatment of his myeloma with Velcade, Revlimid, and dexamethasone.  On exam today [name] reports that he is continuing to do well.  He is not having any symptoms as a result of his chemotherapy treatment and has baseline levels of ostomy and suprapubic catheter output.  He is denying any neuropathy in his hands and any new bone pain.  He is agreeable and willing to continue with treatment.  Additionally his multiple myeloma labs from 02/13/2020 show marked improvement in his serum free light chains and M protein, strongly implying he is having a good response to therapy.   Treatment regimen consists of bortezomib 1.49m/m2 subq (Day 1,8, and 15), daratumumab 1800 mg subq  (day 1,8 and 15), and dexamethasone 20 mg PO (day 1, 8 and 15). After 3 cycles, daratumumab to be administered q 3 weeks (from cycles 4-8). After cycle 8, continue daratumumab q 4 weeks alone (N Engl J Med 2016; 3035:465-681   # IgG Lambda Multiple Myeloma, Relapsed (ISS Stage II) --findings are most consistent with relapsed multiple myeloma. Patient previously successfully treated with Velcade/Rev/Dex --current plan is Daratumumab/Velcade/Dex  ( started 01/09/2020). Continue weekly velcade with transition to q 3 week dara.   --recollect restaging MM labs monthly to include MM panel, SFLC. Additionally will collect LDH and beta 2 microglobulin.  --will have patient evaluated by dentistry prior to restarting Zometa therapy. He had an episode of osteonecrosis in the past, risk of restarting may outweight benefit.  --f/u in 2 weeks on 04/01/2020   #History of DVT --He had placement of IVC filter, remains on Xarelto. --Due to poor mobility, and lack of bleeding complications, I recommend he remain on Xarelto indefinitely. --caution if Plt count were to drop <50  #Watery Diarrhea, resolved #Dehydration, resolved #AKI, resolved --resolved, continue to monitor   # Supportive Care -- provided patient with an albuterol inhaler (for use with daratumumab) --acyclovir 4027mBID for VZV prophylaxis --zofran 3m32m8H PRN for nausea/vomiting  --Zometa to be considered after dental clearance --patient recieved pretreatment RBC phenotype  No orders of the defined types were placed in this encounter.   All questions were answered. The patient knows to call  the clinic with any problems, questions or concerns.  A total of more than 30 minutes were spent on this encounter and over half of that time was spent on counseling and coordination of care as outlined above.   Ledell Peoples, MD Department of Hematology/Oncology [place] [facility] Phone: [phone] Pager:  [phone] Email: [email or web address]  03/18/2020 10:31 AM   Literature Support:  Archie Endo, Weisel K, [place] AK, Masszi T, [place], Christmas I, [place] V, Munder M, [place], Mark TM, Qi M, Schecter J, [place], Qin X, Deraedt W, Ahmadi T, Spencer A, Sonneveld P; [employer] Investigators. Daratumumab, Bortezomib, and Dexamethasone for Multiple Myeloma. [place] Med. 2016 Aug 25;375(8):754-66  --among patients with relapsed or relapsed and refractory multiple myeloma, daratumumab in combination with bortezomib and dexamethasone resulted in significantly longer progression-free survival than bortezomib and dexamethasone alone and was associated with infusion-related reactions and higher rates of thrombocytopenia and neutropenia than bortezomib and dexamethasone alone.

## 2020-03-17 ENCOUNTER — Inpatient Hospital Stay (HOSPITAL_BASED_OUTPATIENT_CLINIC_OR_DEPARTMENT_OTHER): Payer: Medicare (Managed Care) | Admitting: Hematology and Oncology

## 2020-03-17 ENCOUNTER — Inpatient Hospital Stay: Payer: Medicare (Managed Care)

## 2020-03-17 ENCOUNTER — Other Ambulatory Visit: Payer: Self-pay

## 2020-03-17 VITALS — BP 118/87 | HR 101 | Temp 97.6°F | Resp 17 | Ht 68.0 in | Wt 210.0 lb

## 2020-03-17 DIAGNOSIS — C9002 Multiple myeloma in relapse: Secondary | ICD-10-CM

## 2020-03-17 DIAGNOSIS — Z7189 Other specified counseling: Secondary | ICD-10-CM

## 2020-03-17 DIAGNOSIS — Z7901 Long term (current) use of anticoagulants: Secondary | ICD-10-CM

## 2020-03-17 DIAGNOSIS — M87180 Osteonecrosis due to drugs, jaw: Secondary | ICD-10-CM

## 2020-03-17 LAB — CBC WITH DIFFERENTIAL (CANCER CENTER ONLY)
Abs Immature Granulocytes: 0.02 10*3/uL (ref 0.00–0.07)
Basophils Absolute: 0 10*3/uL (ref 0.0–0.1)
Basophils Relative: 1 %
Eosinophils Absolute: 0.2 10*3/uL (ref 0.0–0.5)
Eosinophils Relative: 2 %
HCT: 34.1 % — ABNORMAL LOW (ref 39.0–52.0)
Hemoglobin: 12 g/dL — ABNORMAL LOW (ref 13.0–17.0)
Immature Granulocytes: 0 %
Lymphocytes Relative: 14 %
Lymphs Abs: 1.1 10*3/uL (ref 0.7–4.0)
MCH: 29.9 pg (ref 26.0–34.0)
MCHC: 35.2 g/dL (ref 30.0–36.0)
MCV: 84.8 fL (ref 80.0–100.0)
Monocytes Absolute: 0.6 10*3/uL (ref 0.1–1.0)
Monocytes Relative: 8 %
Neutro Abs: 6 10*3/uL (ref 1.7–7.7)
Neutrophils Relative %: 75 %
Platelet Count: 304 10*3/uL (ref 150–400)
RBC: 4.02 MIL/uL — ABNORMAL LOW (ref 4.22–5.81)
RDW: 15.4 % (ref 11.5–15.5)
WBC Count: 8 10*3/uL (ref 4.0–10.5)
nRBC: 0 % (ref 0.0–0.2)

## 2020-03-17 LAB — CMP (CANCER CENTER ONLY)
ALT: 16 U/L (ref 0–44)
AST: 12 U/L — ABNORMAL LOW (ref 15–41)
Albumin: 3.7 g/dL (ref 3.5–5.0)
Alkaline Phosphatase: 82 U/L (ref 38–126)
Anion gap: 9 (ref 5–15)
BUN: 23 mg/dL (ref 8–23)
CO2: 26 mmol/L (ref 22–32)
Calcium: 10.5 mg/dL — ABNORMAL HIGH (ref 8.9–10.3)
Chloride: 105 mmol/L (ref 98–111)
Creatinine: 0.8 mg/dL (ref 0.61–1.24)
GFR, Est AFR Am: >60 mL/min (ref >60)
GFR, Estimated: >60 mL/min (ref >60)
Glucose, Bld: 91 mg/dL (ref 70–99)
Potassium: 4.3 mmol/L (ref 3.5–5.1)
Sodium: 140 mmol/L (ref 135–145)
Total Bilirubin: 0.5 mg/dL (ref 0.3–1.2)
Total Protein: 7.2 g/dL (ref 6.5–8.1)

## 2020-03-17 LAB — LACTATE DEHYDROGENASE: LDH: 101 U/L (ref 98–192)

## 2020-03-17 MED ORDER — DEXAMETHASONE 4 MG PO TABS
20.0000 mg | ORAL_TABLET | Freq: Once | ORAL | Status: AC
  Administered 2020-03-17: 20 mg via ORAL

## 2020-03-17 MED ORDER — DEXAMETHASONE 4 MG PO TABS
ORAL_TABLET | ORAL | Status: AC
  Filled 2020-03-17: qty 5

## 2020-03-17 MED ORDER — BORTEZOMIB CHEMO SQ INJECTION 3.5 MG (2.5MG/ML)
1.3000 mg/m2 | Freq: Once | INTRAMUSCULAR | Status: AC
  Administered 2020-03-17: 2.75 mg via SUBCUTANEOUS
  Filled 2020-03-17: qty 1.1

## 2020-03-17 NOTE — Patient Instructions (Signed)
[facility] Discharge Instructions for Patients Receiving Chemotherapy  Today you received the following chemotherapy agents Velcade.  To help prevent nausea and vomiting after your treatment, we encourage you to take your nausea medication as directed.  If you develop nausea and vomiting that is not controlled by your nausea medication, call the clinic.   BELOW ARE SYMPTOMS THAT SHOULD BE REPORTED IMMEDIATELY:  *FEVER GREATER THAN 100.5 F  *CHILLS WITH OR WITHOUT FEVER  NAUSEA AND VOMITING THAT IS NOT CONTROLLED WITH YOUR NAUSEA MEDICATION  *UNUSUAL SHORTNESS OF BREATH  *UNUSUAL BRUISING OR BLEEDING  TENDERNESS IN MOUTH AND THROAT WITH OR WITHOUT PRESENCE OF ULCERS  *URINARY PROBLEMS  *BOWEL PROBLEMS  UNUSUAL RASH Items with * indicate a potential emergency and should be followed up as soon as possible.  Feel free to call the clinic should you have any questions or concerns. The clinic phone number is [phone].  Please show the CHEMO ALERT CARD at check-in to the Emergency Department and triage nurse.   

## 2020-03-18 ENCOUNTER — Encounter: Payer: Self-pay | Admitting: Hematology and Oncology

## 2020-03-18 LAB — BETA 2 MICROGLOBULIN, SERUM: Beta-2 Microglobulin: 2 mg/L (ref 0.6–2.4)

## 2020-03-18 LAB — KAPPA/LAMBDA LIGHT CHAINS
Kappa free light chain: 4.8 mg/L (ref 3.3–19.4)
Kappa, lambda light chain ratio: 0.41 (ref 0.26–1.65)
Lambda free light chains: 11.8 mg/L (ref 5.7–26.3)

## 2020-03-19 ENCOUNTER — Encounter: Payer: Self-pay | Admitting: Gastroenterology

## 2020-03-19 LAB — MULTIPLE MYELOMA PANEL, SERUM
Albumin SerPl Elph-Mcnc: 3.6 g/dL (ref 2.9–4.4)
Albumin/Glob SerPl: 1.3 (ref 0.7–1.7)
Alpha 1: 0.3 g/dL (ref 0.0–0.4)
Alpha2 Glob SerPl Elph-Mcnc: 1 g/dL (ref 0.4–1.0)
B-Globulin SerPl Elph-Mcnc: 1 g/dL (ref 0.7–1.3)
Gamma Glob SerPl Elph-Mcnc: 0.7 g/dL (ref 0.4–1.8)
Globulin, Total: 2.9 g/dL (ref 2.2–3.9)
IgA: 19 mg/dL — ABNORMAL LOW (ref 61–437)
IgG (Immunoglobin G), Serum: 688 mg/dL (ref 603–1613)
IgM (Immunoglobulin M), Srm: 11 mg/dL — ABNORMAL LOW (ref 20–172)
M Protein SerPl Elph-Mcnc: 0.3 g/dL — ABNORMAL HIGH
Total Protein ELP: 6.5 g/dL (ref 6.0–8.5)

## 2020-03-25 ENCOUNTER — Other Ambulatory Visit: Payer: Self-pay | Admitting: Hematology and Oncology

## 2020-03-25 ENCOUNTER — Inpatient Hospital Stay: Payer: Medicare (Managed Care) | Attending: Hematology and Oncology

## 2020-03-25 ENCOUNTER — Other Ambulatory Visit: Payer: Self-pay

## 2020-03-25 ENCOUNTER — Inpatient Hospital Stay: Payer: Medicare (Managed Care)

## 2020-03-25 VITALS — BP 111/79 | HR 79 | Temp 97.9°F | Resp 16

## 2020-03-25 DIAGNOSIS — Z87311 Personal history of (healed) other pathological fracture: Secondary | ICD-10-CM | POA: Insufficient documentation

## 2020-03-25 DIAGNOSIS — Z86718 Personal history of other venous thrombosis and embolism: Secondary | ICD-10-CM | POA: Diagnosis not present

## 2020-03-25 DIAGNOSIS — C9002 Multiple myeloma in relapse: Secondary | ICD-10-CM | POA: Diagnosis not present

## 2020-03-25 DIAGNOSIS — D689 Coagulation defect, unspecified: Secondary | ICD-10-CM | POA: Diagnosis not present

## 2020-03-25 DIAGNOSIS — G8220 Paraplegia, unspecified: Secondary | ICD-10-CM | POA: Insufficient documentation

## 2020-03-25 DIAGNOSIS — Z7189 Other specified counseling: Secondary | ICD-10-CM

## 2020-03-25 DIAGNOSIS — E119 Type 2 diabetes mellitus without complications: Secondary | ICD-10-CM | POA: Insufficient documentation

## 2020-03-25 DIAGNOSIS — Z79899 Other long term (current) drug therapy: Secondary | ICD-10-CM | POA: Insufficient documentation

## 2020-03-25 DIAGNOSIS — Z7901 Long term (current) use of anticoagulants: Secondary | ICD-10-CM | POA: Diagnosis not present

## 2020-03-25 DIAGNOSIS — Z5112 Encounter for antineoplastic immunotherapy: Secondary | ICD-10-CM | POA: Diagnosis present

## 2020-03-25 DIAGNOSIS — Z5111 Encounter for antineoplastic chemotherapy: Secondary | ICD-10-CM | POA: Diagnosis present

## 2020-03-25 DIAGNOSIS — Z7984 Long term (current) use of oral hypoglycemic drugs: Secondary | ICD-10-CM | POA: Insufficient documentation

## 2020-03-25 DIAGNOSIS — E2740 Unspecified adrenocortical insufficiency: Secondary | ICD-10-CM | POA: Insufficient documentation

## 2020-03-25 LAB — CBC WITH DIFFERENTIAL (CANCER CENTER ONLY)
Abs Immature Granulocytes: 0.02 10*3/uL (ref 0.00–0.07)
Basophils Absolute: 0 10*3/uL (ref 0.0–0.1)
Basophils Relative: 0 %
Eosinophils Absolute: 0.2 10*3/uL (ref 0.0–0.5)
Eosinophils Relative: 3 %
HCT: 34.8 % — ABNORMAL LOW (ref 39.0–52.0)
Hemoglobin: 11.9 g/dL — ABNORMAL LOW (ref 13.0–17.0)
Immature Granulocytes: 0 %
Lymphocytes Relative: 16 %
Lymphs Abs: 1.1 10*3/uL (ref 0.7–4.0)
MCH: 29.6 pg (ref 26.0–34.0)
MCHC: 34.2 g/dL (ref 30.0–36.0)
MCV: 86.6 fL (ref 80.0–100.0)
Monocytes Absolute: 0.4 10*3/uL (ref 0.1–1.0)
Monocytes Relative: 6 %
Neutro Abs: 5.1 10*3/uL (ref 1.7–7.7)
Neutrophils Relative %: 75 %
Platelet Count: 266 10*3/uL (ref 150–400)
RBC: 4.02 MIL/uL — ABNORMAL LOW (ref 4.22–5.81)
RDW: 15.8 % — ABNORMAL HIGH (ref 11.5–15.5)
WBC Count: 6.9 10*3/uL (ref 4.0–10.5)
nRBC: 0 % (ref 0.0–0.2)

## 2020-03-25 LAB — CMP (CANCER CENTER ONLY)
ALT: 18 U/L (ref 0–44)
AST: 14 U/L — ABNORMAL LOW (ref 15–41)
Albumin: 3.6 g/dL (ref 3.5–5.0)
Alkaline Phosphatase: 75 U/L (ref 38–126)
Anion gap: 9 (ref 5–15)
BUN: 17 mg/dL (ref 8–23)
CO2: 28 mmol/L (ref 22–32)
Calcium: 10.5 mg/dL — ABNORMAL HIGH (ref 8.9–10.3)
Chloride: 107 mmol/L (ref 98–111)
Creatinine: 0.74 mg/dL (ref 0.61–1.24)
GFR, Est AFR Am: >60 mL/min (ref >60)
GFR, Estimated: >60 mL/min (ref >60)
Glucose, Bld: 88 mg/dL (ref 70–99)
Potassium: 4 mmol/L (ref 3.5–5.1)
Sodium: 144 mmol/L (ref 135–145)
Total Bilirubin: 0.5 mg/dL (ref 0.3–1.2)
Total Protein: 7 g/dL (ref 6.5–8.1)

## 2020-03-25 LAB — LACTATE DEHYDROGENASE: LDH: 99 U/L (ref 98–192)

## 2020-03-25 MED ORDER — BORTEZOMIB CHEMO SQ INJECTION 3.5 MG (2.5MG/ML)
1.3000 mg/m2 | Freq: Once | INTRAMUSCULAR | Status: AC
  Administered 2020-03-25: 2.75 mg via SUBCUTANEOUS
  Filled 2020-03-25: qty 1.1

## 2020-03-25 MED ORDER — DEXAMETHASONE 4 MG PO TABS
ORAL_TABLET | ORAL | Status: AC
  Filled 2020-03-25: qty 5

## 2020-03-25 MED ORDER — DEXAMETHASONE 4 MG PO TABS
20.0000 mg | ORAL_TABLET | Freq: Once | ORAL | Status: AC
  Administered 2020-03-25: 20 mg via ORAL

## 2020-03-31 NOTE — Progress Notes (Signed)
[place] [phone]   [phone]  PROGRESS NOTE  Patient Care Team: [name], MD as PCP - General (Family Medicine) [name], MD as Consulting Physician (Physical Medicine and Rehabilitation) Donia Guiles Lavon Paganini, PA-C as Physician Assistant (Physical Medicine and Rehabilitation) [name], MD as Consulting Physician (Hematology and Oncology)  Hematological/Oncological History # IgG Lambda Multiple Myeloma, Relapsed (ISS Stage II) 1) 06/2010: initial diagnosis of Multiple Myeloma after T8 compression fracture. Treated with Velcade/Revlimid/Dexamethasone and achieved a complete remission 2) Velcade was discontinued in September 2012 and that Revlimid and Decadron were discontinued in March 2013. 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 4) Followed by Dr. Alvy Bimler, last clinic visit 10/09/2019. At that time there was concern for relapse of his multiple myeloma.  5) Patient requested transfer to different provider after misunderstanding regarding imaging studies 6) 12/17/2019: transfer care to Dr. Lorenso Courier  7) 01/09/2020: Cycle 1 Day 1 of Dara/Velcade/Dex 8) 01/21/2020: presented as urgent visit for diarrhea and dehydration. Holding chemotherapy scheduled for 01/23/2020. 9) 01/30/2020: Resume dara/velcade/dex after resolution of diarrhea.  10) 02/13/2020: restaging labs show M protein 0.8, Kappa 4.5, lamba 17.2, ratio 0.26, urine M protein 53 (7.1%). All MM labs indicate improvement.  11) 03/10/2020: Cycle 4 Day 1 of Dara/Velcade/Dex. Transition to q 3 week daratumumab.   Interval History:  MEHKI KLUMPP 64 y.o. male with medical history significant for  IgG Lambda Multiple Myeloma who presents for a follow up visit. The patient's last visit was on 03/17/2020. In the interim he has had no changes in his health status.   On exam today Mr. Gaus  notes he has been doing quite well since his last visit with [place] 2 weeks ago.  He reports that he has had no trouble with continued chemotherapy treatment for his multiple myeloma.  He does note that he would like to have his appointments moved to Friday if possible in the Thursdays are not working well for him.  His preference is for earlier in the day as his nurse visits him between 1 and 3 PM.  He notes otherwise his energy has been good, his appetite has been good, and he has been making effort in order to try to decrease his weight.  He notes that he does exercises from his chair including sit ups.  He is also been doing his best to keep up with hydration.  He currently denies having any issues with fevers, chills, sweats, nausea, vomiting or diarrhea.  A full 10 point ROS is listed below.  MEDICAL HISTORY:  Past Medical History:  Diagnosis Date   Adrenal insufficiency ([place])    on chronic dexamethasone   Anemia    Cancer ([place])    Coagulopathy ([place])    on xeralto/ s/p DVT while on coumadin,  IVC in place   Diabetes mellitus without complication ([place])    type 2   Gross hematuria 7/14   post foley cath procedure   History of blood transfusion 7/14   Multiple myeloma    thoracic T8 with paraplegia s/p resection- on chemo at visit 10/13/10   Multiple myeloma    Multiple myeloma without mention of remission    Neurogenic bladder    Neurogenic bowel    Paraplegia ([place])    Partial small bowel obstruction ([place]) during dec 2011 admission    SURGICAL HISTORY: Past Surgical History:  Procedure Laterality Date  COLONOSCOPY WITH PROPOFOL N/A 04/12/2017   Procedure: COLONOSCOPY WITH PROPOFOL;  Surgeon: Irene Shipper, MD;  Location: [facility];  Service: Endoscopy;  Laterality: N/A;   COLONOSCOPY WITH PROPOFOL N/A 04/19/2017   Procedure: COLONOSCOPY WITH PROPOFOL;  Surgeon: Yetta Flock, MD;  Location: [facility];  Service: Gastroenterology;  Laterality: N/A;    COLOSTOMY  07/20/2011   Procedure: COLOSTOMY;  Surgeon: Judieth Keens, DO;  Location: [facility];  Service: General;;   COLOSTOMY REVISION  07/20/2011   Procedure: COLON RESECTION SIGMOID;  Surgeon: Judieth Keens, DO;  Location: [facility];  Service: General;;   CYSTOSCOPY N/A 04/04/2013   Procedure: CYSTOSCOPY WITH LITHALOPAXY;  Surgeon: Alexis Frock, MD;  Location: [facility];  Service: Urology;  Laterality: N/A;   INSERTION OF SUPRAPUBIC CATHETER N/A 04/04/2013   Procedure: INSERTION OF SUPRAPUBIC CATHETER;  Surgeon: Alexis Frock, MD;  Location: [facility];  Service: Urology;  Laterality: N/A;   LAPAROTOMY  07/20/2011   Procedure: EXPLORATORY LAPAROTOMY;  Surgeon: Judieth Keens, DO;  Location: [facility];  Service: General;  Laterality: N/A;   myeloma thoracic T8 with parpaplegia s/p thoracotomy and thoracic T7-9 cage placement on Dec 26th 2011  07/18/10    SOCIAL HISTORY: Social History   Socioeconomic History   Marital status: Married    Spouse name: Not on file   Number of children: Not on file   Years of education: Not on file   Highest education level: Not on file  Occupational History   Not on file  Tobacco Use   Smoking status: Never Smoker   Smokeless tobacco: Never Used  Vaping Use   Vaping Use: Never used  Substance and Sexual Activity   Alcohol use: No   Drug use: No   Sexual activity: Never  Other Topics Concern   Not on file  Social History Narrative   Not on file   Social Determinants of Health   Financial Resource Strain:    Difficulty of Paying Living Expenses: Not on file  Food Insecurity:    Worried About [place] in the Last Year: Not on file   Ran Out of Food in the Last Year: Not on file  Transportation Needs:    Lack of Transportation (Medical): Not on file   Lack of Transportation (Non-Medical): Not on file  Physical Activity:    Days of Exercise per Week: Not on file   Minutes of Exercise per Session: Not on  file  Stress:    Feeling of Stress : Not on file  Social Connections:    Frequency of Communication with Friends and Family: Not on file   Frequency of Social Gatherings with Friends and Family: Not on file   Attends Religious Services: Not on file   Active Member of Clubs or Organizations: Not on file   Attends Archivist Meetings: Not on file   Marital Status: Not on file  Intimate Partner Violence:    Fear of Current or Ex-Partner: Not on file   Emotionally Abused: Not on file   Physically Abused: Not on file   Sexually Abused: Not on file    FAMILY HISTORY: Family History  Problem Relation Age of Onset   Ovarian cancer Mother    Diabetes Father     ALLERGIES:  is allergic to feraheme [ferumoxytol].  MEDICATIONS:  Current Outpatient Medications  Medication Sig Dispense Refill   acyclovir (ZOVIRAX) 400 MG tablet Take 1 tablet (400 mg total) by mouth 2 (two)  times daily. 60 tablet 2   baclofen (LIORESAL) 20 MG tablet Take 20 mg by mouth 2 (two) times daily.     brinzolamide (AZOPT) 1 % ophthalmic suspension Place 1 drop into both eyes 2 (two) times daily.     dorzolamide (TRUSOPT) 2 % ophthalmic solution 1 drop 2 (two) times daily.     iron polysaccharides (NU-IRON) 150 MG capsule Take 1 capsule (150 mg total) by mouth daily. 30 capsule 0   latanoprost (XALATAN) 0.005 % ophthalmic solution Place 1 drop into both eyes at bedtime.     metFORMIN (GLUCOPHAGE) 500 MG tablet Take 500 mg by mouth 2 (two) times daily with a meal.     ondansetron (ZOFRAN) 8 MG tablet Take 1 tablet (8 mg total) by mouth every 8 (eight) hours as needed for nausea or vomiting. 30 tablet 0   rivaroxaban (XARELTO) 10 MG TABS tablet Take 1 tablet (10 mg total) by mouth daily with supper. 30 tablet 9   Skin Protectants, Misc. (EUCERIN) cream Apply 1 application topically 2 (two) times daily as needed for dry skin.      zinc oxide (BALMEX) 11.3 % CREA cream Apply 1 application  topically 2 (two) times daily.     No current facility-administered medications for this visit.    REVIEW OF SYSTEMS:   Constitutional: ( - ) fevers, ( - )  chills , ( - ) night sweats Eyes: ( - ) blurriness of vision, ( - ) double vision, ( - ) watery eyes Ears, nose, mouth, throat, and face: ( - ) mucositis, ( - ) sore throat Respiratory: ( - ) cough, ( - ) dyspnea, ( - ) wheezes Cardiovascular: ( - ) palpitation, ( - ) chest discomfort, ( - ) lower extremity swelling Gastrointestinal:  ( - ) nausea, ( - ) heartburn, ( - ) change in bowel habits Skin: ( - ) abnormal skin rashes Lymphatics: ( - ) new lymphadenopathy, ( - ) easy bruising Neurological: ( - ) numbness, ( - ) tingling, ( - ) new weaknesses Behavioral/Psych: ( - ) mood change, ( - ) new changes  All other systems were reviewed with the patient and are negative.  PHYSICAL EXAMINATION: ECOG PERFORMANCE STATUS: paraplegic.   Vitals:   04/01/20 1115  BP: 119/69  Pulse: 92  Resp: 17  Temp: (!) 96.3 F (35.7 C)  SpO2: 100%   Filed Weights   04/01/20 1115  Weight: 212 lb 4.8 oz (96.3 kg)    GENERAL: well appearing middle aged [place] American male alert, no distress and comfortable SKIN: skin color, texture, turgor are normal, no rashes or significant lesions EYES: conjunctiva are pink and non-injected, sclera clear LUNGS: clear to auscultation and percussion with normal breathing effort HEART: regular rate & rhythm and no murmurs and no lower extremity edema Musculoskeletal: no cyanosis of digits and no clubbing  PSYCH: alert & oriented x 3, fluent speech NEURO: paraplegic, no use of LE bilaterally.   LABORATORY DATA:  I have reviewed the data as listed CBC Latest Ref Rng & Units 04/01/2020 03/25/2020 03/17/2020  WBC 4.0 - 10.5 K/uL 7.4 6.9 8.0  Hemoglobin 13.0 - 17.0 g/dL 12.0(L) 11.9(L) 12.0(L)  Hematocrit 39 - 52 % 34.5(L) 34.8(L) 34.1(L)  Platelets 150 - 400 K/uL 287 266 304    CMP Latest Ref Rng & Units  04/01/2020 03/25/2020 03/17/2020  Glucose 70 - 99 mg/dL 83 88 91  BUN 8 - 23 mg/dL 15 17 23   Creatinine 0.61 -  1.24 mg/dL 0.75 0.74 0.80  Sodium 135 - 145 mmol/L 143 144 140  Potassium 3.5 - 5.1 mmol/L 4.1 4.0 4.3  Chloride 98 - 111 mmol/L 107 107 105  CO2 22 - 32 mmol/L 29 28 26   Calcium 8.9 - 10.3 mg/dL 9.6 10.5(H) 10.5(H)  Total Protein 6.5 - 8.1 g/dL 6.7 7.0 7.2  Total Bilirubin 0.3 - 1.2 mg/dL 0.4 0.5 0.5  Alkaline Phos 38 - 126 U/L 73 75 82  AST 15 - 41 U/L 14(L) 14(L) 12(L)  ALT 0 - 44 U/L 17 18 16     Lab Results  Component Value Date   MPROTEIN 0.3 (H) 03/17/2020   MPROTEIN 0.8 (H) 02/13/2020   MPROTEIN 2.5 (H) 12/18/2019   Lab Results  Component Value Date   KPAFRELGTCHN 4.8 03/17/2020   KPAFRELGTCHN 4.5 02/13/2020   KPAFRELGTCHN 9.5 12/18/2019   LAMBDASER 11.8 03/17/2020   LAMBDASER 17.2 02/13/2020   LAMBDASER 216.8 (H) 12/18/2019   KAPLAMBRATIO 0.41 03/17/2020   KAPLAMBRATIO UNABLE TO CALCULATE 03/05/2020   KAPLAMBRATIO >1.58 02/20/2020    RADIOGRAPHIC STUDIES: No results found.  ASSESSMENT & PLAN OREN BARELLA 63 y.o. male with medical history significant for  IgG Lambda Multiple Myeloma who presents for a follow up visit.  After review of the labs, discussion with the patient, and reviewed the imaging his findings are most consistent with a relapsed multiple myeloma.The patient has had excellent success before with treatment of his myeloma with Velcade, Revlimid, and dexamethasone.  On exam today Mr. Valko reports he is tolerating therapy well with no difficulties. He is ready and able to proceed with treatment.    Treatment regimen consists of bortezomib 1.26m/m2 subq (Day 1,8, and 15), daratumumab 1800 mg subq (day 1,8 and 15), and dexamethasone 20 mg PO (day 1, 8 and 15). After 3 cycles, daratumumab to be administered q 3 weeks (from cycles 4-8). After cycle 8, continue daratumumab q 4 weeks alone (N Engl J Med 2016; [id number]   # IgG Lambda Multiple  Myeloma, Relapsed (ISS Stage II) --findings are most consistent with relapsed multiple myeloma. Patient previously successfully treated with Velcade/Rev/Dex --current plan is Daratumumab/Velcade/Dex  (started 01/09/2020). Continue weekly velcade with transition to q 3 week dara.   --recollect restaging MM labs monthly to include MM panel, SFLC. Additionally will collect LDH and beta 2 microglobulin.  --will have patient evaluated by dentistry prior to restarting Zometa therapy. He had an episode of osteonecrosis in the past, risk of restarting may outweight benefit.  --f/u in 2 weeks on 04/15/2020   #History of DVT --He had placement of IVC filter, remains on Xarelto. --Due to poor mobility, and lack of bleeding complications, I recommend he remain on Xarelto indefinitely. --caution if Plt count were to drop <50  #Watery Diarrhea, resolved #Dehydration, resolved #AKI, resolved --resolved, continue to monitor   # Supportive Care -- provided patient with an albuterol inhaler (for use with daratumumab) --acyclovir 4011mBID for VZV prophylaxis --zofran 70m41m8H PRN for nausea/vomiting  --Zometa to be considered after dental clearance --patient recieved pretreatment RBC phenotype  No orders of the defined types were placed in this encounter.  All questions were answered. The patient knows to call the clinic with any problems, questions or concerns.  A total of more than 30 minutes were spent on this encounter and over half of that time was spent on counseling and coordination of care as outlined above.   JohLedell PeoplesD Department of Hematology/Oncology [facility] Phone: [phone] Pager: [phone] Email: [email or web address]  04/01/2020 4:13 PM   Literature Support:  Archie Endo, Weisel K, [place] AK, Masszi T, [place], [place] I, Nuangola V, Munder M, [place], Mark TM, Qi M, Schecter J, [place], Qin X, Deraedt W, Ahmadi  T, Spencer A, Sonneveld P; [employer] Investigators. Daratumumab, Bortezomib, and Dexamethasone for Multiple Myeloma. [place] Med. 2016 Aug 25;375(8):754-66  --among patients with relapsed or relapsed and refractory multiple myeloma, daratumumab in combination with bortezomib and dexamethasone resulted in significantly longer progression-free survival than bortezomib and dexamethasone alone and was associated with infusion-related reactions and higher rates of thrombocytopenia and neutropenia than bortezomib and dexamethasone alone.

## 2020-04-01 ENCOUNTER — Inpatient Hospital Stay (HOSPITAL_BASED_OUTPATIENT_CLINIC_OR_DEPARTMENT_OTHER): Payer: Medicare (Managed Care) | Admitting: Hematology and Oncology

## 2020-04-01 ENCOUNTER — Other Ambulatory Visit: Payer: Self-pay

## 2020-04-01 ENCOUNTER — Inpatient Hospital Stay: Payer: Medicare (Managed Care)

## 2020-04-01 ENCOUNTER — Encounter: Payer: Self-pay | Admitting: Hematology and Oncology

## 2020-04-01 VITALS — BP 119/69 | HR 92 | Temp 96.3°F | Resp 17 | Ht 68.0 in | Wt 212.3 lb

## 2020-04-01 DIAGNOSIS — Z5111 Encounter for antineoplastic chemotherapy: Secondary | ICD-10-CM | POA: Diagnosis not present

## 2020-04-01 DIAGNOSIS — C9002 Multiple myeloma in relapse: Secondary | ICD-10-CM

## 2020-04-01 DIAGNOSIS — Z86718 Personal history of other venous thrombosis and embolism: Secondary | ICD-10-CM | POA: Diagnosis not present

## 2020-04-01 DIAGNOSIS — Z7189 Other specified counseling: Secondary | ICD-10-CM

## 2020-04-01 LAB — CBC WITH DIFFERENTIAL (CANCER CENTER ONLY)
Abs Immature Granulocytes: 0.02 10*3/uL (ref 0.00–0.07)
Basophils Absolute: 0 10*3/uL (ref 0.0–0.1)
Basophils Relative: 0 %
Eosinophils Absolute: 0.2 10*3/uL (ref 0.0–0.5)
Eosinophils Relative: 2 %
HCT: 34.5 % — ABNORMAL LOW (ref 39.0–52.0)
Hemoglobin: 12 g/dL — ABNORMAL LOW (ref 13.0–17.0)
Immature Granulocytes: 0 %
Lymphocytes Relative: 14 %
Lymphs Abs: 1 10*3/uL (ref 0.7–4.0)
MCH: 29.8 pg (ref 26.0–34.0)
MCHC: 34.8 g/dL (ref 30.0–36.0)
MCV: 85.6 fL (ref 80.0–100.0)
Monocytes Absolute: 0.5 10*3/uL (ref 0.1–1.0)
Monocytes Relative: 6 %
Neutro Abs: 5.7 10*3/uL (ref 1.7–7.7)
Neutrophils Relative %: 78 %
Platelet Count: 287 10*3/uL (ref 150–400)
RBC: 4.03 MIL/uL — ABNORMAL LOW (ref 4.22–5.81)
RDW: 15.6 % — ABNORMAL HIGH (ref 11.5–15.5)
WBC Count: 7.4 10*3/uL (ref 4.0–10.5)
nRBC: 0 % (ref 0.0–0.2)

## 2020-04-01 LAB — CMP (CANCER CENTER ONLY)
ALT: 17 U/L (ref 0–44)
AST: 14 U/L — ABNORMAL LOW (ref 15–41)
Albumin: 3.5 g/dL (ref 3.5–5.0)
Alkaline Phosphatase: 73 U/L (ref 38–126)
Anion gap: 7 (ref 5–15)
BUN: 15 mg/dL (ref 8–23)
CO2: 29 mmol/L (ref 22–32)
Calcium: 9.6 mg/dL (ref 8.9–10.3)
Chloride: 107 mmol/L (ref 98–111)
Creatinine: 0.75 mg/dL (ref 0.61–1.24)
GFR, Est AFR Am: >60 mL/min (ref >60)
GFR, Estimated: >60 mL/min (ref >60)
Glucose, Bld: 83 mg/dL (ref 70–99)
Potassium: 4.1 mmol/L (ref 3.5–5.1)
Sodium: 143 mmol/L (ref 135–145)
Total Bilirubin: 0.4 mg/dL (ref 0.3–1.2)
Total Protein: 6.7 g/dL (ref 6.5–8.1)

## 2020-04-01 LAB — LACTATE DEHYDROGENASE: LDH: 90 U/L — ABNORMAL LOW (ref 98–192)

## 2020-04-01 MED ORDER — ACETAMINOPHEN 325 MG PO TABS
650.0000 mg | ORAL_TABLET | Freq: Once | ORAL | Status: AC
  Administered 2020-04-01: 650 mg via ORAL

## 2020-04-01 MED ORDER — DARATUMUMAB-HYALURONIDASE-FIHJ 1800-30000 MG-UT/15ML ~~LOC~~ SOLN
1800.0000 mg | Freq: Once | SUBCUTANEOUS | Status: AC
  Administered 2020-04-01: 1800 mg via SUBCUTANEOUS
  Filled 2020-04-01: qty 15

## 2020-04-01 MED ORDER — BORTEZOMIB CHEMO SQ INJECTION 3.5 MG (2.5MG/ML)
1.3000 mg/m2 | Freq: Once | INTRAMUSCULAR | Status: AC
  Administered 2020-04-01: 2.75 mg via SUBCUTANEOUS
  Filled 2020-04-01: qty 1.1

## 2020-04-01 MED ORDER — DEXAMETHASONE 4 MG PO TABS
ORAL_TABLET | ORAL | Status: AC
  Filled 2020-04-01: qty 5

## 2020-04-01 MED ORDER — DIPHENHYDRAMINE HCL 25 MG PO CAPS
25.0000 mg | ORAL_CAPSULE | Freq: Once | ORAL | Status: AC
  Administered 2020-04-01: 25 mg via ORAL

## 2020-04-01 MED ORDER — ACETAMINOPHEN 325 MG PO TABS
ORAL_TABLET | ORAL | Status: AC
  Filled 2020-04-01: qty 2

## 2020-04-01 MED ORDER — DIPHENHYDRAMINE HCL 25 MG PO CAPS
ORAL_CAPSULE | ORAL | Status: AC
  Filled 2020-04-01: qty 2

## 2020-04-01 MED ORDER — DEXAMETHASONE 4 MG PO TABS
20.0000 mg | ORAL_TABLET | Freq: Once | ORAL | Status: AC
  Administered 2020-04-01: 20 mg via ORAL

## 2020-04-01 NOTE — Patient Instructions (Signed)
[facility] Discharge Instructions for Patients Receiving Chemotherapy  Today you received the following chemotherapy agents: Bortezomib (Velcade) and Daratumumab- Hyaluronidase (Darzalex Faspro)  To help prevent nausea and vomiting after your treatment, we encourage you to take your nausea medication as directed by your MD.   If you develop nausea and vomiting that is not controlled by your nausea medication, call the clinic.   BELOW ARE SYMPTOMS THAT SHOULD BE REPORTED IMMEDIATELY:  *FEVER GREATER THAN 100.5 F  *CHILLS WITH OR WITHOUT FEVER  NAUSEA AND VOMITING THAT IS NOT CONTROLLED WITH YOUR NAUSEA MEDICATION  *UNUSUAL SHORTNESS OF BREATH  *UNUSUAL BRUISING OR BLEEDING  TENDERNESS IN MOUTH AND THROAT WITH OR WITHOUT PRESENCE OF ULCERS  *URINARY PROBLEMS  *BOWEL PROBLEMS  UNUSUAL RASH Items with * indicate a potential emergency and should be followed up as soon as possible.  Feel free to call the clinic should you have any questions or concerns. The clinic phone number is [phone].  Please show the [place] at check-in to the Emergency Department and triage nurse.

## 2020-04-01 NOTE — Progress Notes (Signed)
Patient declined to stay for observation at this time. VSS, no s/s distress. Patient educated on s/s hypersensitivity reactions at this time. No complaints.

## 2020-04-08 ENCOUNTER — Inpatient Hospital Stay: Payer: Medicare (Managed Care)

## 2020-04-08 ENCOUNTER — Other Ambulatory Visit: Payer: Self-pay

## 2020-04-08 VITALS — BP 114/75 | HR 91 | Temp 98.3°F | Resp 18

## 2020-04-08 DIAGNOSIS — C9002 Multiple myeloma in relapse: Secondary | ICD-10-CM

## 2020-04-08 DIAGNOSIS — Z5111 Encounter for antineoplastic chemotherapy: Secondary | ICD-10-CM | POA: Diagnosis not present

## 2020-04-08 DIAGNOSIS — Z7189 Other specified counseling: Secondary | ICD-10-CM

## 2020-04-08 LAB — CBC WITH DIFFERENTIAL (CANCER CENTER ONLY)
Abs Immature Granulocytes: 0.01 10*3/uL (ref 0.00–0.07)
Basophils Absolute: 0 10*3/uL (ref 0.0–0.1)
Basophils Relative: 0 %
Eosinophils Absolute: 0.2 10*3/uL (ref 0.0–0.5)
Eosinophils Relative: 2 %
HCT: 35.4 % — ABNORMAL LOW (ref 39.0–52.0)
Hemoglobin: 12.4 g/dL — ABNORMAL LOW (ref 13.0–17.0)
Immature Granulocytes: 0 %
Lymphocytes Relative: 12 %
Lymphs Abs: 0.9 10*3/uL (ref 0.7–4.0)
MCH: 29.7 pg (ref 26.0–34.0)
MCHC: 35 g/dL (ref 30.0–36.0)
MCV: 84.7 fL (ref 80.0–100.0)
Monocytes Absolute: 0.6 10*3/uL (ref 0.1–1.0)
Monocytes Relative: 8 %
Neutro Abs: 5.9 10*3/uL (ref 1.7–7.7)
Neutrophils Relative %: 78 %
Platelet Count: 287 10*3/uL (ref 150–400)
RBC: 4.18 MIL/uL — ABNORMAL LOW (ref 4.22–5.81)
RDW: 15.2 % (ref 11.5–15.5)
WBC Count: 7.6 10*3/uL (ref 4.0–10.5)
nRBC: 0 % (ref 0.0–0.2)

## 2020-04-08 LAB — CMP (CANCER CENTER ONLY)
ALT: 17 U/L (ref 0–44)
AST: 12 U/L — ABNORMAL LOW (ref 15–41)
Albumin: 3.6 g/dL (ref 3.5–5.0)
Alkaline Phosphatase: 80 U/L (ref 38–126)
Anion gap: 9 (ref 5–15)
BUN: 17 mg/dL (ref 8–23)
CO2: 28 mmol/L (ref 22–32)
Calcium: 9.8 mg/dL (ref 8.9–10.3)
Chloride: 104 mmol/L (ref 98–111)
Creatinine: 0.81 mg/dL (ref 0.61–1.24)
GFR, Est AFR Am: >60 mL/min (ref >60)
GFR, Estimated: >60 mL/min (ref >60)
Glucose, Bld: 85 mg/dL (ref 70–99)
Potassium: 4 mmol/L (ref 3.5–5.1)
Sodium: 141 mmol/L (ref 135–145)
Total Bilirubin: 0.5 mg/dL (ref 0.3–1.2)
Total Protein: 7 g/dL (ref 6.5–8.1)

## 2020-04-08 LAB — LACTATE DEHYDROGENASE: LDH: 94 U/L — ABNORMAL LOW (ref 98–192)

## 2020-04-08 MED ORDER — BORTEZOMIB CHEMO SQ INJECTION 3.5 MG (2.5MG/ML)
1.3000 mg/m2 | Freq: Once | INTRAMUSCULAR | Status: AC
  Administered 2020-04-08: 2.75 mg via SUBCUTANEOUS
  Filled 2020-04-08: qty 1.1

## 2020-04-08 MED ORDER — DEXAMETHASONE 4 MG PO TABS
ORAL_TABLET | ORAL | Status: AC
  Filled 2020-04-08: qty 5

## 2020-04-08 MED ORDER — DEXAMETHASONE 4 MG PO TABS
20.0000 mg | ORAL_TABLET | Freq: Once | ORAL | Status: AC
  Administered 2020-04-08: 20 mg via ORAL

## 2020-04-08 NOTE — Patient Instructions (Signed)
[facility] Discharge Instructions for Patients Receiving Chemotherapy  Today you received the following chemotherapy agents Velcade.  To help prevent nausea and vomiting after your treatment, we encourage you to take your nausea medication as directed.  If you develop nausea and vomiting that is not controlled by your nausea medication, call the clinic.   BELOW ARE SYMPTOMS THAT SHOULD BE REPORTED IMMEDIATELY:  *FEVER GREATER THAN 100.5 F  *CHILLS WITH OR WITHOUT FEVER  NAUSEA AND VOMITING THAT IS NOT CONTROLLED WITH YOUR NAUSEA MEDICATION  *UNUSUAL SHORTNESS OF BREATH  *UNUSUAL BRUISING OR BLEEDING  TENDERNESS IN MOUTH AND THROAT WITH OR WITHOUT PRESENCE OF ULCERS  *URINARY PROBLEMS  *BOWEL PROBLEMS  UNUSUAL RASH Items with * indicate a potential emergency and should be followed up as soon as possible.  Feel free to call the clinic should you have any questions or concerns. The clinic phone number is [phone].  Please show the CHEMO ALERT CARD at check-in to the Emergency Department and triage nurse.   

## 2020-04-13 ENCOUNTER — Telehealth: Payer: Self-pay | Admitting: Hematology and Oncology

## 2020-04-13 NOTE — Telephone Encounter (Signed)
Called and spoke with patients wife. Confirmed upcoming appts

## 2020-04-15 ENCOUNTER — Inpatient Hospital Stay: Payer: Medicare (Managed Care)

## 2020-04-15 ENCOUNTER — Encounter: Payer: Self-pay | Admitting: Hematology and Oncology

## 2020-04-15 ENCOUNTER — Other Ambulatory Visit: Payer: Self-pay | Admitting: Hematology and Oncology

## 2020-04-15 ENCOUNTER — Other Ambulatory Visit: Payer: Self-pay

## 2020-04-15 ENCOUNTER — Inpatient Hospital Stay (HOSPITAL_BASED_OUTPATIENT_CLINIC_OR_DEPARTMENT_OTHER): Payer: Medicare (Managed Care) | Admitting: Hematology and Oncology

## 2020-04-15 VITALS — BP 128/81 | HR 103 | Temp 97.7°F | Resp 18 | Ht 68.0 in | Wt 215.8 lb

## 2020-04-15 VITALS — HR 91

## 2020-04-15 DIAGNOSIS — Z5111 Encounter for antineoplastic chemotherapy: Secondary | ICD-10-CM

## 2020-04-15 DIAGNOSIS — C9002 Multiple myeloma in relapse: Secondary | ICD-10-CM

## 2020-04-15 DIAGNOSIS — Z86718 Personal history of other venous thrombosis and embolism: Secondary | ICD-10-CM | POA: Diagnosis not present

## 2020-04-15 DIAGNOSIS — Z7189 Other specified counseling: Secondary | ICD-10-CM

## 2020-04-15 LAB — CBC WITH DIFFERENTIAL (CANCER CENTER ONLY)
Abs Immature Granulocytes: 0.02 10*3/uL (ref 0.00–0.07)
Basophils Absolute: 0 10*3/uL (ref 0.0–0.1)
Basophils Relative: 0 %
Eosinophils Absolute: 0.2 10*3/uL (ref 0.0–0.5)
Eosinophils Relative: 2 %
HCT: 36.6 % — ABNORMAL LOW (ref 39.0–52.0)
Hemoglobin: 12.8 g/dL — ABNORMAL LOW (ref 13.0–17.0)
Immature Granulocytes: 0 %
Lymphocytes Relative: 14 %
Lymphs Abs: 1 10*3/uL (ref 0.7–4.0)
MCH: 29.5 pg (ref 26.0–34.0)
MCHC: 35 g/dL (ref 30.0–36.0)
MCV: 84.3 fL (ref 80.0–100.0)
Monocytes Absolute: 0.6 10*3/uL (ref 0.1–1.0)
Monocytes Relative: 9 %
Neutro Abs: 5.2 10*3/uL (ref 1.7–7.7)
Neutrophils Relative %: 75 %
Platelet Count: 287 10*3/uL (ref 150–400)
RBC: 4.34 MIL/uL (ref 4.22–5.81)
RDW: 15 % (ref 11.5–15.5)
WBC Count: 7.1 10*3/uL (ref 4.0–10.5)
nRBC: 0 % (ref 0.0–0.2)

## 2020-04-15 LAB — CMP (CANCER CENTER ONLY)
ALT: 15 U/L (ref 0–44)
AST: 13 U/L — ABNORMAL LOW (ref 15–41)
Albumin: 3.7 g/dL (ref 3.5–5.0)
Alkaline Phosphatase: 78 U/L (ref 38–126)
Anion gap: 7 (ref 5–15)
BUN: 15 mg/dL (ref 8–23)
CO2: 31 mmol/L (ref 22–32)
Calcium: 10.3 mg/dL (ref 8.9–10.3)
Chloride: 102 mmol/L (ref 98–111)
Creatinine: 0.78 mg/dL (ref 0.61–1.24)
GFR, Est AFR Am: >60 mL/min (ref >60)
GFR, Estimated: >60 mL/min (ref >60)
Glucose, Bld: 89 mg/dL (ref 70–99)
Potassium: 3.9 mmol/L (ref 3.5–5.1)
Sodium: 140 mmol/L (ref 135–145)
Total Bilirubin: 0.5 mg/dL (ref 0.3–1.2)
Total Protein: 7.1 g/dL (ref 6.5–8.1)

## 2020-04-15 LAB — LACTATE DEHYDROGENASE: LDH: 94 U/L — ABNORMAL LOW (ref 98–192)

## 2020-04-15 MED ORDER — BORTEZOMIB CHEMO SQ INJECTION 3.5 MG (2.5MG/ML)
1.3000 mg/m2 | Freq: Once | INTRAMUSCULAR | Status: AC
  Administered 2020-04-15: 2.75 mg via SUBCUTANEOUS
  Filled 2020-04-15: qty 1.1

## 2020-04-15 MED ORDER — DEXAMETHASONE 4 MG PO TABS
20.0000 mg | ORAL_TABLET | Freq: Once | ORAL | Status: AC
  Administered 2020-04-15: 20 mg via ORAL

## 2020-04-15 NOTE — Patient Instructions (Signed)
[facility] Discharge Instructions for Patients Receiving Chemotherapy  Today you received the following chemotherapy agents Velcade.  To help prevent nausea and vomiting after your treatment, we encourage you to take your nausea medication as directed.  If you develop nausea and vomiting that is not controlled by your nausea medication, call the clinic.   BELOW ARE SYMPTOMS THAT SHOULD BE REPORTED IMMEDIATELY:  *FEVER GREATER THAN 100.5 F  *CHILLS WITH OR WITHOUT FEVER  NAUSEA AND VOMITING THAT IS NOT CONTROLLED WITH YOUR NAUSEA MEDICATION  *UNUSUAL SHORTNESS OF BREATH  *UNUSUAL BRUISING OR BLEEDING  TENDERNESS IN MOUTH AND THROAT WITH OR WITHOUT PRESENCE OF ULCERS  *URINARY PROBLEMS  *BOWEL PROBLEMS  UNUSUAL RASH Items with * indicate a potential emergency and should be followed up as soon as possible.  Feel free to call the clinic should you have any questions or concerns. The clinic phone number is [phone].  Please show the CHEMO ALERT CARD at check-in to the Emergency Department and triage nurse.   

## 2020-04-15 NOTE — Progress Notes (Signed)
No longer has port patient checked into next appt.

## 2020-04-15 NOTE — Progress Notes (Signed)
[facility] [phone]   Fax:([phone]) [phone]  PROGRESS NOTE  Patient Care Team: [name], MD as PCP - General (Family Medicine) [name], MD as Consulting Physician (Physical Medicine and Rehabilitation) Donia Guiles Lavon Paganini, PA-C as Physician Assistant (Physical Medicine and Rehabilitation) [name], MD as Consulting Physician (Hematology and Oncology)  Hematological/Oncological History # IgG Lambda Multiple Myeloma, Relapsed (ISS Stage II) 1) 06/2010: initial diagnosis of Multiple Myeloma after T8 compression fracture. Treated with Velcade/Revlimid/Dexamethasone and achieved a complete remission 2) Velcade was discontinued in September 2012 and that Revlimid and Decadron were discontinued in March 2013. 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 4) Followed by Dr. Alvy Bimler, last clinic visit 10/09/2019. At that time there was concern for relapse of his multiple myeloma.  5) Patient requested transfer to different provider after misunderstanding regarding imaging studies 6) 12/17/2019: transfer care to Dr. Lorenso Courier  7) 01/09/2020: Cycle 1 Day 1 of Dara/Velcade/Dex 8) 01/21/2020: presented as urgent visit for diarrhea and dehydration. Holding chemotherapy scheduled for 01/23/2020. 9) 01/30/2020: Resume dara/velcade/dex after resolution of diarrhea.  10) 02/13/2020: restaging labs show M protein 0.8, Kappa 4.5, lamba 17.2, ratio 0.26, urine M protein 53 (7.1%). All MM labs indicate improvement.  11) 03/10/2020: Cycle 4 Day 1 of Dara/Velcade/Dex. Transition to q 3 week daratumumab.   Interval History:  [name] 64 y.o. male with medical history significant for  IgG Lambda Multiple Myeloma who presents for a follow up visit. The patient's last visit was on 04/01/2020. In the interim he has had no changes in his health status.   On exam today [name]  notes he continues to do well.  He reports that he has not had any issues with fevers, chills, sweats, nausea, vomiting or diarrhea.  His ostomy output has been stable.  He has actually increased in weight from 210 pounds on 8/25 to 215 pounds on 04/15/2020.  He notes his appetite has been good and he is not having any signs or symptoms of neuropathy.  A full 10 point ROS is listed below.  MEDICAL HISTORY:  Past Medical History:  Diagnosis Date  . Adrenal insufficiency (HCC)    on chronic dexamethasone  . Anemia   . Cancer ([place])   . Coagulopathy ([place])    on xeralto/ s/p DVT while on coumadin,  IVC in place  . Diabetes mellitus without complication ([place])    type 2  . Gross hematuria 7/14   post foley cath procedure  . History of blood transfusion 7/14  . Multiple myeloma    thoracic T8 with paraplegia s/p resection- on chemo at visit 10/13/10  . Multiple myeloma   . Multiple myeloma without mention of remission   . Neurogenic bladder   . Neurogenic bowel   . Paraplegia ([place])   . Partial small bowel obstruction ([place]) during dec 2011 admission    SURGICAL HISTORY: Past Surgical History:  Procedure Laterality Date  . COLONOSCOPY WITH PROPOFOL N/A 04/12/2017   Procedure: COLONOSCOPY WITH PROPOFOL;  Surgeon: Irene Shipper, MD;  Location: [facility];  Service: Endoscopy;  Laterality: N/A;  . COLONOSCOPY WITH PROPOFOL N/A 04/19/2017   Procedure: COLONOSCOPY WITH PROPOFOL;  Surgeon: Yetta Flock, MD;  Location: [facility];  Service: Gastroenterology;  Laterality: N/A;  . COLOSTOMY  07/20/2011   Procedure: COLOSTOMY;  Surgeon: Judieth Keens, DO;  Location: [facility];  Service: General;;  .  COLOSTOMY REVISION  07/20/2011   Procedure: COLON RESECTION SIGMOID;  Surgeon: Judieth Keens, DO;  Location: [facility];  Service: General;;  . CYSTOSCOPY N/A 04/04/2013   Procedure: CYSTOSCOPY WITH LITHALOPAXY;  Surgeon: Alexis Frock, MD;  Location: [facility];  Service: Urology;  Laterality:  N/A;  . INSERTION OF SUPRAPUBIC CATHETER N/A 04/04/2013   Procedure: INSERTION OF SUPRAPUBIC CATHETER;  Surgeon: Alexis Frock, MD;  Location: [facility];  Service: Urology;  Laterality: N/A;  . LAPAROTOMY  07/20/2011   Procedure: EXPLORATORY LAPAROTOMY;  Surgeon: Judieth Keens, DO;  Location: [facility];  Service: General;  Laterality: N/A;  . myeloma thoracic T8 with parpaplegia s/p thoracotomy and thoracic T7-9 cage placement on Dec 26th 2011  07/18/10    SOCIAL HISTORY: Social History   Socioeconomic History  . Marital status: Married    Spouse name: Not on file  . Number of children: Not on file  . Years of education: Not on file  . Highest education level: Not on file  Occupational History  . Not on file  Tobacco Use  . Smoking status: Never Smoker  . Smokeless tobacco: Never Used  Vaping Use  . Vaping Use: Never used  Substance and Sexual Activity  . Alcohol use: No  . Drug use: No  . Sexual activity: Never  Other Topics Concern  . Not on file  Social History Narrative  . Not on file   Social Determinants of Health   Financial Resource Strain:   . Difficulty of Paying Living Expenses: Not on file  Food Insecurity:   . Worried About Charity fundraiser in the Last Year: Not on file  . Ran Out of Food in the Last Year: Not on file  Transportation Needs:   . Lack of Transportation (Medical): Not on file  . Lack of Transportation (Non-Medical): Not on file  Physical Activity:   . Days of Exercise per Week: Not on file  . Minutes of Exercise per Session: Not on file  Stress:   . Feeling of Stress : Not on file  Social Connections:   . Frequency of Communication with Friends and Family: Not on file  . Frequency of Social Gatherings with Friends and Family: Not on file  . Attends Religious Services: Not on file  . Active Member of Clubs or Organizations: Not on file  . Attends Archivist Meetings: Not on file  . Marital Status: Not on file  Intimate  Partner Violence:   . Fear of Current or Ex-Partner: Not on file  . Emotionally Abused: Not on file  . Physically Abused: Not on file  . Sexually Abused: Not on file    FAMILY HISTORY: Family History  Problem Relation Age of Onset  . Ovarian cancer Mother   . Diabetes Father     ALLERGIES:  is allergic to feraheme [ferumoxytol].  MEDICATIONS:  Current Outpatient Medications  Medication Sig Dispense Refill  . brinzolamide (AZOPT) 1 % ophthalmic suspension Place 1 drop into both eyes 2 (two) times daily.    Marland Kitchen latanoprost (XALATAN) 0.005 % ophthalmic solution Place 1 drop into both eyes at bedtime.    Marland Kitchen acyclovir (ZOVIRAX) 400 MG tablet Take 1 tablet (400 mg total) by mouth 2 (two) times daily. 60 tablet 2  . baclofen (LIORESAL) 20 MG tablet Take 20 mg by mouth 2 (two) times daily.    . dorzolamide (TRUSOPT) 2 % ophthalmic solution 1 drop 2 (two) times daily.    Marland Kitchen iron  polysaccharides (NU-IRON) 150 MG capsule Take 1 capsule (150 mg total) by mouth daily. 30 capsule 0  . metFORMIN (GLUCOPHAGE) 500 MG tablet Take 500 mg by mouth 2 (two) times daily with a meal.    . ondansetron (ZOFRAN) 8 MG tablet Take 1 tablet (8 mg total) by mouth every 8 (eight) hours as needed for nausea or vomiting. 30 tablet 0  . rivaroxaban (XARELTO) 10 MG TABS tablet Take 1 tablet (10 mg total) by mouth daily with supper. 30 tablet 9  . Skin Protectants, Misc. (EUCERIN) cream Apply 1 application topically 2 (two) times daily as needed for dry skin.     Marland Kitchen zinc oxide (BALMEX) 11.3 % CREA cream Apply 1 application topically 2 (two) times daily.     No current facility-administered medications for this visit.    REVIEW OF SYSTEMS:   Constitutional: ( - ) fevers, ( - )  chills , ( - ) night sweats Eyes: ( - ) blurriness of vision, ( - ) double vision, ( - ) watery eyes Ears, nose, mouth, throat, and face: ( - ) mucositis, ( - ) sore throat Respiratory: ( - ) cough, ( - ) dyspnea, ( - ) wheezes Cardiovascular: (  - ) palpitation, ( - ) chest discomfort, ( - ) lower extremity swelling Gastrointestinal:  ( - ) nausea, ( - ) heartburn, ( - ) change in bowel habits Skin: ( - ) abnormal skin rashes Lymphatics: ( - ) new lymphadenopathy, ( - ) easy bruising Neurological: ( - ) numbness, ( - ) tingling, ( - ) new weaknesses Behavioral/Psych: ( - ) mood change, ( - ) new changes  All other systems were reviewed with the patient and are negative.  PHYSICAL EXAMINATION: ECOG PERFORMANCE STATUS: paraplegic.   Vitals:   04/15/20 1041  BP: 128/81  Pulse: (!) 103  Resp: 18  Temp: 97.7 F (36.5 C)  SpO2: 100%   Filed Weights   04/15/20 1041  Weight: 215 lb 12.8 oz (97.9 kg)    GENERAL: well appearing middle aged [place] American male alert, no distress and comfortable SKIN: skin color, texture, turgor are normal, no rashes or significant lesions EYES: conjunctiva are pink and non-injected, sclera clear LUNGS: clear to auscultation and percussion with normal breathing effort HEART: regular rate & rhythm and no murmurs and no lower extremity edema Musculoskeletal: no cyanosis of digits and no clubbing  PSYCH: alert & oriented x 3, fluent speech NEURO: paraplegic, no use of LE bilaterally.   LABORATORY DATA:  I have reviewed the data as listed CBC Latest Ref Rng & Units 04/15/2020 04/08/2020 04/01/2020  WBC 4.0 - 10.5 K/uL 7.1 7.6 7.4  Hemoglobin 13.0 - 17.0 g/dL 12.8(L) 12.4(L) 12.0(L)  Hematocrit 39 - 52 % 36.6(L) 35.4(L) 34.5(L)  Platelets 150 - 400 K/uL 287 287 287    CMP Latest Ref Rng & Units 04/15/2020 04/08/2020 04/01/2020  Glucose 70 - 99 mg/dL 89 85 83  BUN 8 - 23 mg/dL _0 Creatinine 0.61 - 1.24 mg/dL 0.78 0.81 0.75  Sodium 135 - 145 mmol/L 140 141 143  Potassium 3.5 - 5.1 mmol/L 3.9 4.0 4.1  Chloride 98 - 111 mmol/L 102 104 107  CO2 22 - 32 mmol/L _1 Calcium 8.9 - 10.3 mg/dL 10.3 9.8 9.6  Total Protein 6.5 - 8.1 g/dL 7.1 7.0 6.7  Total Bilirubin 0.3 - 1.2 mg/dL 0.5 0.5 0.4    Alkaline Phos 38 - 126 U/L 78  80 73  AST 15 - 41 U/L 13(L) 12(L) 14(L)  ALT 0 - 44 U/L _0 Lab Results  Component Value Date   MPROTEIN 0.3 (H) 03/17/2020   MPROTEIN 0.8 (H) 02/13/2020   MPROTEIN 2.5 (H) 12/18/2019   Lab Results  Component Value Date   KPAFRELGTCHN 4.8 03/17/2020   KPAFRELGTCHN 4.5 02/13/2020   KPAFRELGTCHN 9.5 12/18/2019   LAMBDASER 11.8 03/17/2020   LAMBDASER 17.2 02/13/2020   LAMBDASER 216.8 (H) 12/18/2019   KAPLAMBRATIO 0.41 03/17/2020   KAPLAMBRATIO UNABLE TO CALCULATE 03/05/2020   KAPLAMBRATIO >1.58 02/20/2020    RADIOGRAPHIC STUDIES: No results found.  ASSESSMENT & PLAN [name] 64 y.o. male with medical history significant for  IgG Lambda Multiple Myeloma who presents for a follow up visit.  After review of the labs, discussion with the patient, and reviewed the imaging his findings are most consistent with a relapsed multiple myeloma.The patient has had excellent success before with treatment of his myeloma with Velcade, Revlimid, and dexamethasone.  On exam today [name] reports has been well at his baseline level health with no new symptoms.  His ostomy output is stable and his weight has been increasing.  He has no other new signs or symptoms today.  He is willing and able to proceed with treatment today.   Treatment regimen consists of bortezomib 1.45m/m2 subq (Day 1,8, and 15), daratumumab 1800 mg subq (day 1,8 and 15), and dexamethasone 20 mg PO (day 1, 8 and 15). After 3 cycles, daratumumab to be administered q 3 weeks (from cycles 4-8). After cycle 8, continue daratumumab q 4 weeks alone (N Engl J Med 2016; [id number]   # IgG Lambda Multiple Myeloma, Relapsed (ISS Stage II) --findings are most consistent with relapsed multiple myeloma. Patient previously successfully treated with Velcade/Rev/Dex --current plan is Daratumumab/Velcade/Dex  (started 01/09/2020). Continue weekly velcade with q 3 week dara.   --recollect restaging  MM labs monthly to include MM panel, SFLC. Additionally will collect LDH and beta 2 microglobulin.  --will have patient evaluated by dentistry prior to restarting Zometa therapy. He had an episode of osteonecrosis in the past, risk of restarting may outweight benefit.  --f/u in 2 weeks on 04/30/2020  #History of DVT --He had placement of IVC filter, remains on Xarelto. --Due to poor mobility, and lack of bleeding complications, I recommend he remain on Xarelto indefinitely. --caution if Plt count were to drop <50  #Watery Diarrhea, resolved #Dehydration, resolved #AKI, resolved --resolved, continue to monitor   # Supportive Care -- provided patient with an albuterol inhaler (for use with daratumumab) --acyclovir 4066mBID for VZV prophylaxis --zofran 35m56m8H PRN for nausea/vomiting  --Zometa to be considered after dental clearance --patient recieved pretreatment RBC phenotype  No orders of the defined types were placed in this encounter.  All questions were answered. The patient knows to call the clinic with any problems, questions or concerns.  A total of more than 30 minutes were spent on this encounter and over half of that time was spent on counseling and coordination of care as outlined above.   JohLedell PeoplesD Department of Hematology/Oncology [facility]: [phone]: [phone]: [email or web address]  04/15/2020 11:44 AM   Literature Support:  PalArchie Endoeisel K, Raliegh Ipooka AK, Masszi T, BekCarthagepiMelfa HunBushnell Munder M, MatIves Estatesark TM, Qi M, Schecter J, Amin H, Qin X, Deraedt W, Ahmadi T,  Spencer A, Sonneveld P; [employer] Investigators. Daratumumab, Bortezomib, and Dexamethasone for Multiple Myeloma. [place] Med. 2016 Aug 25;375(8):754-66  --among patients with relapsed or relapsed and refractory multiple myeloma, daratumumab in combination with bortezomib and dexamethasone resulted  in significantly longer progression-free survival than bortezomib and dexamethasone alone and was associated with infusion-related reactions and higher rates of thrombocytopenia and neutropenia than bortezomib and dexamethasone alone.

## 2020-04-23 ENCOUNTER — Inpatient Hospital Stay: Payer: Medicare (Managed Care)

## 2020-04-23 ENCOUNTER — Other Ambulatory Visit: Payer: Self-pay | Admitting: *Deleted

## 2020-04-23 ENCOUNTER — Inpatient Hospital Stay: Payer: Medicare (Managed Care) | Attending: Hematology and Oncology

## 2020-04-23 ENCOUNTER — Other Ambulatory Visit: Payer: Self-pay

## 2020-04-23 VITALS — BP 104/68 | HR 92 | Temp 99.3°F | Resp 18

## 2020-04-23 DIAGNOSIS — Z95828 Presence of other vascular implants and grafts: Secondary | ICD-10-CM

## 2020-04-23 DIAGNOSIS — C9002 Multiple myeloma in relapse: Secondary | ICD-10-CM

## 2020-04-23 DIAGNOSIS — Z86718 Personal history of other venous thrombosis and embolism: Secondary | ICD-10-CM | POA: Insufficient documentation

## 2020-04-23 DIAGNOSIS — Z79899 Other long term (current) drug therapy: Secondary | ICD-10-CM | POA: Insufficient documentation

## 2020-04-23 DIAGNOSIS — Z5112 Encounter for antineoplastic immunotherapy: Secondary | ICD-10-CM | POA: Insufficient documentation

## 2020-04-23 DIAGNOSIS — Z7984 Long term (current) use of oral hypoglycemic drugs: Secondary | ICD-10-CM | POA: Diagnosis not present

## 2020-04-23 DIAGNOSIS — C9000 Multiple myeloma not having achieved remission: Secondary | ICD-10-CM | POA: Diagnosis present

## 2020-04-23 DIAGNOSIS — E119 Type 2 diabetes mellitus without complications: Secondary | ICD-10-CM | POA: Insufficient documentation

## 2020-04-23 DIAGNOSIS — Z8041 Family history of malignant neoplasm of ovary: Secondary | ICD-10-CM | POA: Insufficient documentation

## 2020-04-23 DIAGNOSIS — Z7189 Other specified counseling: Secondary | ICD-10-CM

## 2020-04-23 DIAGNOSIS — Z833 Family history of diabetes mellitus: Secondary | ICD-10-CM | POA: Insufficient documentation

## 2020-04-23 LAB — CBC WITH DIFFERENTIAL (CANCER CENTER ONLY)
Abs Immature Granulocytes: 0.01 10*3/uL (ref 0.00–0.07)
Basophils Absolute: 0 10*3/uL (ref 0.0–0.1)
Basophils Relative: 1 %
Eosinophils Absolute: 0.2 10*3/uL (ref 0.0–0.5)
Eosinophils Relative: 4 %
HCT: 34.5 % — ABNORMAL LOW (ref 39.0–52.0)
Hemoglobin: 12.2 g/dL — ABNORMAL LOW (ref 13.0–17.0)
Immature Granulocytes: 0 %
Lymphocytes Relative: 14 %
Lymphs Abs: 0.8 10*3/uL (ref 0.7–4.0)
MCH: 29.5 pg (ref 26.0–34.0)
MCHC: 35.4 g/dL (ref 30.0–36.0)
MCV: 83.3 fL (ref 80.0–100.0)
Monocytes Absolute: 0.7 10*3/uL (ref 0.1–1.0)
Monocytes Relative: 12 %
Neutro Abs: 4.2 10*3/uL (ref 1.7–7.7)
Neutrophils Relative %: 69 %
Platelet Count: 256 10*3/uL (ref 150–400)
RBC: 4.14 MIL/uL — ABNORMAL LOW (ref 4.22–5.81)
RDW: 14.8 % (ref 11.5–15.5)
WBC Count: 6 10*3/uL (ref 4.0–10.5)
nRBC: 0 % (ref 0.0–0.2)

## 2020-04-23 LAB — CMP (CANCER CENTER ONLY)
ALT: 15 U/L (ref 0–44)
AST: 14 U/L — ABNORMAL LOW (ref 15–41)
Albumin: 3.5 g/dL (ref 3.5–5.0)
Alkaline Phosphatase: 77 U/L (ref 38–126)
Anion gap: 8 (ref 5–15)
BUN: 17 mg/dL (ref 8–23)
CO2: 26 mmol/L (ref 22–32)
Calcium: 9.7 mg/dL (ref 8.9–10.3)
Chloride: 106 mmol/L (ref 98–111)
Creatinine: 0.86 mg/dL (ref 0.61–1.24)
GFR, Est AFR Am: >60 mL/min (ref >60)
GFR, Estimated: >60 mL/min (ref >60)
Glucose, Bld: 92 mg/dL (ref 70–99)
Potassium: 3.9 mmol/L (ref 3.5–5.1)
Sodium: 140 mmol/L (ref 135–145)
Total Bilirubin: 0.3 mg/dL (ref 0.3–1.2)
Total Protein: 6.9 g/dL (ref 6.5–8.1)

## 2020-04-23 LAB — LACTATE DEHYDROGENASE: LDH: 130 U/L (ref 98–192)

## 2020-04-23 MED ORDER — DIPHENHYDRAMINE HCL 25 MG PO CAPS
25.0000 mg | ORAL_CAPSULE | Freq: Once | ORAL | Status: AC
  Administered 2020-04-23: 25 mg via ORAL

## 2020-04-23 MED ORDER — SODIUM CHLORIDE 0.9% FLUSH
10.0000 mL | INTRAVENOUS | Status: DC | PRN
  Filled 2020-04-23: qty 10

## 2020-04-23 MED ORDER — ACETAMINOPHEN 325 MG PO TABS
ORAL_TABLET | ORAL | Status: AC
  Filled 2020-04-23: qty 2

## 2020-04-23 MED ORDER — ACETAMINOPHEN 325 MG PO TABS
650.0000 mg | ORAL_TABLET | Freq: Once | ORAL | Status: AC
  Administered 2020-04-23: 650 mg via ORAL

## 2020-04-23 MED ORDER — DEXAMETHASONE 4 MG PO TABS
ORAL_TABLET | ORAL | Status: AC
  Filled 2020-04-23: qty 5

## 2020-04-23 MED ORDER — DEXAMETHASONE 4 MG PO TABS
20.0000 mg | ORAL_TABLET | Freq: Once | ORAL | Status: AC
  Administered 2020-04-23: 20 mg via ORAL

## 2020-04-23 MED ORDER — DIPHENHYDRAMINE HCL 25 MG PO CAPS
ORAL_CAPSULE | ORAL | Status: AC
  Filled 2020-04-23: qty 1

## 2020-04-23 MED ORDER — DARATUMUMAB-HYALURONIDASE-FIHJ 1800-30000 MG-UT/15ML ~~LOC~~ SOLN
1800.0000 mg | Freq: Once | SUBCUTANEOUS | Status: AC
  Administered 2020-04-23: 1800 mg via SUBCUTANEOUS
  Filled 2020-04-23: qty 15

## 2020-04-23 MED ORDER — BORTEZOMIB CHEMO SQ INJECTION 3.5 MG (2.5MG/ML)
1.3000 mg/m2 | Freq: Once | INTRAMUSCULAR | Status: AC
  Administered 2020-04-23: 2.75 mg via SUBCUTANEOUS
  Filled 2020-04-23: qty 1.1

## 2020-04-23 NOTE — Patient Instructions (Signed)
[facility] Discharge Instructions for Patients Receiving Chemotherapy  Today you received the following chemotherapy agents Velcade, darzalex  To help prevent nausea and vomiting after your treatment, we encourage you to take your nausea medication as directed.   If you develop nausea and vomiting that is not controlled by your nausea medication, call the clinic.   BELOW ARE SYMPTOMS THAT SHOULD BE REPORTED IMMEDIATELY:  *FEVER GREATER THAN 100.5 F  *CHILLS WITH OR WITHOUT FEVER  NAUSEA AND VOMITING THAT IS NOT CONTROLLED WITH YOUR NAUSEA MEDICATION  *UNUSUAL SHORTNESS OF BREATH  *UNUSUAL BRUISING OR BLEEDING  TENDERNESS IN MOUTH AND THROAT WITH OR WITHOUT PRESENCE OF ULCERS  *URINARY PROBLEMS  *BOWEL PROBLEMS  UNUSUAL RASH Items with * indicate a potential emergency and should be followed up as soon as possible.  Feel free to call the clinic should you have any questions or concerns. The clinic phone number is [phone].  Please show the [facility] at check-in to the Emergency Department and triage nurse.

## 2020-04-30 ENCOUNTER — Inpatient Hospital Stay: Payer: Medicare (Managed Care)

## 2020-04-30 ENCOUNTER — Other Ambulatory Visit: Payer: Self-pay | Admitting: Hematology and Oncology

## 2020-04-30 ENCOUNTER — Other Ambulatory Visit: Payer: Self-pay

## 2020-04-30 ENCOUNTER — Inpatient Hospital Stay (HOSPITAL_BASED_OUTPATIENT_CLINIC_OR_DEPARTMENT_OTHER): Payer: Medicare (Managed Care) | Admitting: Hematology and Oncology

## 2020-04-30 VITALS — HR 81

## 2020-04-30 VITALS — BP 114/69 | HR 103 | Temp 98.2°F | Resp 18 | Ht 68.0 in

## 2020-04-30 DIAGNOSIS — C9002 Multiple myeloma in relapse: Secondary | ICD-10-CM

## 2020-04-30 DIAGNOSIS — Z5112 Encounter for antineoplastic immunotherapy: Secondary | ICD-10-CM | POA: Diagnosis not present

## 2020-04-30 DIAGNOSIS — Z7189 Other specified counseling: Secondary | ICD-10-CM

## 2020-04-30 LAB — CBC WITH DIFFERENTIAL (CANCER CENTER ONLY)
Abs Immature Granulocytes: 0.01 10*3/uL (ref 0.00–0.07)
Basophils Absolute: 0 10*3/uL (ref 0.0–0.1)
Basophils Relative: 0 %
Eosinophils Absolute: 0.2 10*3/uL (ref 0.0–0.5)
Eosinophils Relative: 3 %
HCT: 34.6 % — ABNORMAL LOW (ref 39.0–52.0)
Hemoglobin: 12.1 g/dL — ABNORMAL LOW (ref 13.0–17.0)
Immature Granulocytes: 0 %
Lymphocytes Relative: 11 %
Lymphs Abs: 0.7 10*3/uL (ref 0.7–4.0)
MCH: 28.9 pg (ref 26.0–34.0)
MCHC: 35 g/dL (ref 30.0–36.0)
MCV: 82.6 fL (ref 80.0–100.0)
Monocytes Absolute: 0.5 10*3/uL (ref 0.1–1.0)
Monocytes Relative: 8 %
Neutro Abs: 5.2 10*3/uL (ref 1.7–7.7)
Neutrophils Relative %: 78 %
Platelet Count: 298 10*3/uL (ref 150–400)
RBC: 4.19 MIL/uL — ABNORMAL LOW (ref 4.22–5.81)
RDW: 14.8 % (ref 11.5–15.5)
WBC Count: 6.7 10*3/uL (ref 4.0–10.5)
nRBC: 0 % (ref 0.0–0.2)

## 2020-04-30 LAB — CMP (CANCER CENTER ONLY)
ALT: 20 U/L (ref 0–44)
AST: 15 U/L (ref 15–41)
Albumin: 3.4 g/dL — ABNORMAL LOW (ref 3.5–5.0)
Alkaline Phosphatase: 77 U/L (ref 38–126)
Anion gap: 5 (ref 5–15)
BUN: 14 mg/dL (ref 8–23)
CO2: 29 mmol/L (ref 22–32)
Calcium: 9.5 mg/dL (ref 8.9–10.3)
Chloride: 107 mmol/L (ref 98–111)
Creatinine: 0.78 mg/dL (ref 0.61–1.24)
GFR, Estimated: >60 mL/min (ref >60)
Glucose, Bld: 102 mg/dL — ABNORMAL HIGH (ref 70–99)
Potassium: 3.9 mmol/L (ref 3.5–5.1)
Sodium: 141 mmol/L (ref 135–145)
Total Bilirubin: 0.3 mg/dL (ref 0.3–1.2)
Total Protein: 6.8 g/dL (ref 6.5–8.1)

## 2020-04-30 LAB — LACTATE DEHYDROGENASE: LDH: 107 U/L (ref 98–192)

## 2020-04-30 MED ORDER — DEXAMETHASONE 4 MG PO TABS
20.0000 mg | ORAL_TABLET | Freq: Once | ORAL | Status: AC
  Administered 2020-04-30: 20 mg via ORAL

## 2020-04-30 MED ORDER — BORTEZOMIB CHEMO SQ INJECTION 3.5 MG (2.5MG/ML)
1.3000 mg/m2 | Freq: Once | INTRAMUSCULAR | Status: AC
  Administered 2020-04-30: 2.75 mg via SUBCUTANEOUS
  Filled 2020-04-30: qty 1.1

## 2020-04-30 MED ORDER — DEXAMETHASONE 4 MG PO TABS
ORAL_TABLET | ORAL | Status: AC
  Filled 2020-04-30: qty 5

## 2020-04-30 NOTE — Progress Notes (Signed)
[place] [phone]   [phone]  PROGRESS NOTE  Patient Care Team: [name], MD as PCP - General (Family Medicine) [name], MD as Consulting Physician (Physical Medicine and Rehabilitation) Donia Guiles Lavon Paganini, PA-C as Physician Assistant (Physical Medicine and Rehabilitation) [name], MD as Consulting Physician (Hematology and Oncology)  Hematological/Oncological History # IgG Lambda Multiple Myeloma, Relapsed (ISS Stage II) 1) 06/2010: initial diagnosis of Multiple Myeloma after T8 compression fracture. Treated with Velcade/Revlimid/Dexamethasone and achieved a complete remission 2) Velcade was discontinued in September 2012 and that Revlimid and Decadron were discontinued in March 2013. 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 4) Followed by Dr. Alvy Bimler, last clinic visit 10/09/2019. At that time there was concern for relapse of his multiple myeloma.  5) Patient requested transfer to different provider after misunderstanding regarding imaging studies 6) 12/17/2019: transfer care to Dr. Lorenso Courier  7) 01/09/2020: Cycle 1 Day 1 of Dara/Velcade/Dex 8) 01/21/2020: presented as urgent visit for diarrhea and dehydration. Holding chemotherapy scheduled for 01/23/2020. 9) 01/30/2020: Resume dara/velcade/dex after resolution of diarrhea.  10) 02/13/2020: restaging labs show M protein 0.8, Kappa 4.5, lamba 17.2, ratio 0.26, urine M protein 53 (7.1%). All MM labs indicate improvement.  11) 03/10/2020: Cycle 4 Day 1 of Dara/Velcade/Dex. Transition to q 3 week daratumumab.   Interval History:  [name] 64 y.o. male with medical history significant for  IgG Lambda Multiple Myeloma who presents for a follow up visit. The patient's last visit was on 04/30/2020. In the interim he has had no changes in his health status.   On exam today [name]  reports that he is "doing all right".  He notes that he is getting his shingles and flu shots up-to-date and he is also eager to get his Covid booster at the end of October.  He reports that he has had no trouble with chemotherapy since our last discussion.  He reports that his appetite has been good and that his energy has also been quite strong.  He denies having any issues with neuropathy in his hands.  He reports no fevers, chills, sweats, nausea, vomiting or diarrhea.  Overall he is in excellent spirits and ready to proceed forward.  A full 10 point ROS is listed below.  MEDICAL HISTORY:  Past Medical History:  Diagnosis Date  . Adrenal insufficiency (HCC)    on chronic dexamethasone  . Anemia   . Cancer ([place])   . Coagulopathy ([place])    on xeralto/ s/p DVT while on coumadin,  IVC in place  . Diabetes mellitus without complication ([place])    type 2  . Gross hematuria 7/14   post foley cath procedure  . History of blood transfusion 7/14  . Multiple myeloma    thoracic T8 with paraplegia s/p resection- on chemo at visit 10/13/10  . Multiple myeloma   . Multiple myeloma without mention of remission   . Neurogenic bladder   . Neurogenic bowel   . Paraplegia ([place])   . Partial small bowel obstruction ([place]) during dec 2011 admission    SURGICAL HISTORY: Past Surgical History:  Procedure Laterality Date  . COLONOSCOPY WITH PROPOFOL N/A 04/12/2017   Procedure: COLONOSCOPY WITH PROPOFOL;  Surgeon: Irene Shipper, MD;  [facility];  Service: Endoscopy;  Laterality: N/A;  . COLONOSCOPY WITH PROPOFOL N/A 04/19/2017   Procedure: COLONOSCOPY WITH PROPOFOL;  Surgeon: [place] Cellar  P, MD;  [facility]: [facility];  Service: Gastroenterology;  Laterality: N/A;  . COLOSTOMY  07/20/2011   Procedure: COLOSTOMY;  Surgeon: Judieth Keens, DO;  Location: [facility];  Service: General;;  . COLOSTOMY REVISION  07/20/2011   Procedure: COLON RESECTION SIGMOID;  Surgeon: Judieth Keens, DO;   Location: [place] [facility];  Service: General;;  . CYSTOSCOPY N/A 04/04/2013   Procedure: CYSTOSCOPY WITH LITHALOPAXY;  Surgeon: Alexis Frock, MD;  [facility];  Service: Urology;  Laterality: N/A;  . INSERTION OF SUPRAPUBIC CATHETER N/A 04/04/2013   Procedure: INSERTION OF SUPRAPUBIC CATHETER;  Surgeon: Alexis Frock, MD;  [facility];  Service: Urology;  Laterality: N/A;  . LAPAROTOMY  07/20/2011   Procedure: EXPLORATORY LAPAROTOMY;  Surgeon: Judieth Keens, DO;  Location: [facility];  Service: General;  Laterality: N/A;  . myeloma thoracic T8 with parpaplegia s/p thoracotomy and thoracic T7-9 cage placement on Dec 26th 2011  07/18/10    SOCIAL HISTORY: Social History   Socioeconomic History  . Marital status: Married    Spouse name: Not on file  . Number of children: Not on file  . Years of education: Not on file  . Highest education level: Not on file  Occupational History  . Not on file  Tobacco Use  . Smoking status: Never Smoker  . Smokeless tobacco: Never Used  Vaping Use  . Vaping Use: Never used  Substance and Sexual Activity  . Alcohol use: No  . Drug use: No  . Sexual activity: Never  Other Topics Concern  . Not on file  Social History Narrative  . Not on file   Social Determinants of Health   Financial Resource Strain:   . Difficulty of Paying Living Expenses: Not on file  Food Insecurity:   . Worried About Charity fundraiser in the Last Year: Not on file  . Ran Out of Food in the Last Year: Not on file  Transportation Needs:   . Lack of Transportation (Medical): Not on file  . Lack of Transportation (Non-Medical): Not on file  Physical Activity:   . Days of Exercise per Week: Not on file  . Minutes of Exercise per Session: Not on file  Stress:   . Feeling of Stress : Not on file  Social Connections:   . Frequency of Communication with Friends and Family: Not on file  . Frequency of Social Gatherings with Friends and Family: Not on file  . Attends  Religious Services: Not on file  . Active Member of Clubs or Organizations: Not on file  . Attends Archivist Meetings: Not on file  . Marital Status: Not on file  Intimate Partner Violence:   . Fear of Current or Ex-Partner: Not on file  . Emotionally Abused: Not on file  . Physically Abused: Not on file  . Sexually Abused: Not on file    FAMILY HISTORY: Family History  Problem Relation Age of Onset  . Ovarian cancer Mother   . Diabetes Father     ALLERGIES:  is allergic to feraheme [ferumoxytol].  MEDICATIONS:  Current Outpatient Medications  Medication Sig Dispense Refill  . atorvastatin (LIPITOR) 10 MG tablet Take 10 mg by mouth daily.    Marland Kitchen acyclovir (ZOVIRAX) 400 MG tablet Take 1 tablet (400 mg total) by mouth 2 (two) times daily. 60 tablet 2  . baclofen (LIORESAL) 20 MG tablet Take 20 mg by mouth 2 (two) times daily.    . diazepam (DIASTAT ACUDIAL) 10  MG GEL SMARTSIG:By Mouth    . dorzolamide (TRUSOPT) 2 % ophthalmic solution 1 drop 2 (two) times daily.    . iron polysaccharides (NU-IRON) 150 MG capsule Take 1 capsule (150 mg total) by mouth daily. 30 capsule 0  . latanoprost (XALATAN) 0.005 % ophthalmic solution Place 1 drop into both eyes at bedtime.    . metFORMIN (GLUCOPHAGE) 500 MG tablet Take 500 mg by mouth 2 (two) times daily with a meal.    . ondansetron (ZOFRAN) 8 MG tablet Take 1 tablet (8 mg total) by mouth every 8 (eight) hours as needed for nausea or vomiting. 30 tablet 0  . rivaroxaban (XARELTO) 10 MG TABS tablet Take 1 tablet (10 mg total) by mouth daily with supper. 30 tablet 9  . Skin Protectants, Misc. (EUCERIN) cream Apply 1 application topically 2 (two) times daily as needed for dry skin.     Marland Kitchen zinc oxide (BALMEX) 11.3 % CREA cream Apply 1 application topically 2 (two) times daily.     No current facility-administered medications for this visit.    REVIEW OF SYSTEMS:   Constitutional: ( - ) fevers, ( - )  chills , ( - ) night sweats Eyes:  ( - ) blurriness of vision, ( - ) double vision, ( - ) watery eyes Ears, nose, mouth, throat, and face: ( - ) mucositis, ( - ) sore throat Respiratory: ( - ) cough, ( - ) dyspnea, ( - ) wheezes Cardiovascular: ( - ) palpitation, ( - ) chest discomfort, ( - ) lower extremity swelling Gastrointestinal:  ( - ) nausea, ( - ) heartburn, ( - ) change in bowel habits Skin: ( - ) abnormal skin rashes Lymphatics: ( - ) new lymphadenopathy, ( - ) easy bruising Neurological: ( - ) numbness, ( - ) tingling, ( - ) new weaknesses Behavioral/Psych: ( - ) mood change, ( - ) new changes  All other systems were reviewed with the patient and are negative.  PHYSICAL EXAMINATION: ECOG PERFORMANCE STATUS: paraplegic.   Vitals:   04/30/20 1101  BP: 114/69  Pulse: (!) 103  Resp: 18  Temp: 98.2 F (36.8 C)  SpO2: 100%   Filed Weights    GENERAL: well appearing middle aged [place] American male alert, no distress and comfortable SKIN: skin color, texture, turgor are normal, no rashes or significant lesions EYES: conjunctiva are pink and non-injected, sclera clear LUNGS: clear to auscultation and percussion with normal breathing effort HEART: regular rate & rhythm and no murmurs and no lower extremity edema Musculoskeletal: no cyanosis of digits and no clubbing  PSYCH: alert & oriented x 3, fluent speech NEURO: paraplegic, no use of LE bilaterally.   LABORATORY DATA:  I have reviewed the data as listed CBC Latest Ref Rng & Units 04/30/2020 04/23/2020 04/15/2020  WBC 4.0 - 10.5 K/uL 6.7 6.0 7.1  Hemoglobin 13.0 - 17.0 g/dL 12.1(L) 12.2(L) 12.8(L)  Hematocrit 39 - 52 % 34.6(L) 34.5(L) 36.6(L)  Platelets 150 - 400 K/uL 298 256 287    CMP Latest Ref Rng & Units 04/30/2020 04/23/2020 04/15/2020  Glucose 70 - 99 mg/dL 102(H) 92 89  BUN 8 - 23 mg/dL 14 17 15   Creatinine 0.61 - 1.24 mg/dL 0.78 0.86 0.78  Sodium 135 - 145 mmol/L 141 140 140  Potassium 3.5 - 5.1 mmol/L 3.9 3.9 3.9  Chloride 98 - 111 mmol/L  107 106 102  CO2 22 - 32 mmol/L 29 26 31   Calcium 8.9 - 10.3 mg/dL 9.5  9.7 10.3  Total Protein 6.5 - 8.1 g/dL 6.8 6.9 7.1  Total Bilirubin 0.3 - 1.2 mg/dL 0.3 0.3 0.5  Alkaline Phos 38 - 126 U/L 77 77 78  AST 15 - 41 U/L 15 14(L) 13(L)  ALT 0 - 44 U/L 20 15 15     Lab Results  Component Value Date   MPROTEIN 0.3 (H) 03/17/2020   MPROTEIN 0.8 (H) 02/13/2020   MPROTEIN 2.5 (H) 12/18/2019   Lab Results  Component Value Date   KPAFRELGTCHN 4.8 03/17/2020   KPAFRELGTCHN 4.5 02/13/2020   KPAFRELGTCHN 9.5 12/18/2019   LAMBDASER 11.8 03/17/2020   LAMBDASER 17.2 02/13/2020   LAMBDASER 216.8 (H) 12/18/2019   KAPLAMBRATIO 0.41 03/17/2020   KAPLAMBRATIO UNABLE TO CALCULATE 03/05/2020   KAPLAMBRATIO >1.58 02/20/2020    RADIOGRAPHIC STUDIES: No results found.  ASSESSMENT & PLAN [name] 64 y.o. male with medical history significant for  IgG Lambda Multiple Myeloma who presents for a follow up visit.  After review of the labs, discussion with the patient, and reviewed the imaging his findings are most consistent with a relapsed multiple myeloma.The patient has had excellent success before with treatment of his myeloma with Velcade, Revlimid, and dexamethasone.  On exam today [name] reports he is tolerating treatment well with no additional symptoms at this time.  Overall he is optimistic in good spirit.  No other new symptoms are present at this time.   Treatment regimen consists of bortezomib 1.76m/m2 subq (Day 1,8, and 15), daratumumab 1800 mg subq (day 1,8 and 15), and dexamethasone 20 mg PO (day 1, 8 and 15). After 3 cycles, daratumumab to be administered q 3 weeks (from cycles 4-8). After cycle 8, continue daratumumab q 4 weeks alone (N Engl J Med 2016; [id number]  # IgG Lambda Multiple Myeloma, Relapsed (ISS Stage II) --findings are most consistent with relapsed multiple myeloma. Patient previously successfully treated with Velcade/Rev/Dex --current plan is  Daratumumab/Velcade/Dex  (started 01/09/2020). Continue weekly velcade with q 3 week dara.   --recollect restaging MM labs monthly to include MM panel, SFLC. Additionally will collect LDH and beta 2 microglobulin.  --will have patient evaluated by dentistry prior to restarting Zometa therapy. He had an episode of osteonecrosis in the past, risk of restarting may outweight benefit.  --f/u in 3 weeks on 05/20/2020  #History of DVT --He had placement of IVC filter, remains on Xarelto. --Due to poor mobility, and lack of bleeding complications, I recommend he remain on Xarelto indefinitely. --caution if Plt count were to drop <50  # Supportive Care -- provided patient with an albuterol inhaler (for use with daratumumab) --acyclovir 4096mBID for VZV prophylaxis --zofran 25m57m8H PRN for nausea/vomiting  --Zometa to be considered after dental clearance --patient recieved pretreatment RBC phenotype  No orders of the defined types were placed in this encounter.  All questions were answered. The patient knows to call the clinic with any problems, questions or concerns.  A total of more than 30 minutes were spent on this encounter and over half of that time was spent on counseling and coordination of care as outlined above.   JohLedell PeoplesD Department of Hematology/Oncology [facility]: [phone]: [phone]: [email or web address]  04/30/2020 11:21 AM   Literature Support:  PalArchie Endoeisel K, Raliegh Ipooka AK, Masszi T, BekWarson WoodspiDenton HunCaspar Munder M, MatPalmerark TM, Qi M, Schecter J, Amin H, Qin X, DerLisle  T, Spencer A, Sonneveld P; CASTOR Investigators. Daratumumab, Bortezomib, and Dexamethasone for Multiple Myeloma. [place] Med. 2016 Aug 25;375(8):754-66  --among patients with relapsed or relapsed and refractory multiple myeloma, daratumumab in combination with bortezomib and  dexamethasone resulted in significantly longer progression-free survival than bortezomib and dexamethasone alone and was associated with infusion-related reactions and higher rates of thrombocytopenia and neutropenia than bortezomib and dexamethasone alone.

## 2020-04-30 NOTE — Patient Instructions (Signed)
[facility] Discharge Instructions for Patients Receiving Chemotherapy  Today you received the following chemotherapy agents velcade  To help prevent nausea and vomiting after your treatment, we encourage you to take your nausea medication as directed.   If you develop nausea and vomiting that is not controlled by your nausea medication, call the clinic.   BELOW ARE SYMPTOMS THAT SHOULD BE REPORTED IMMEDIATELY:  *FEVER GREATER THAN 100.5 F  *CHILLS WITH OR WITHOUT FEVER  NAUSEA AND VOMITING THAT IS NOT CONTROLLED WITH YOUR NAUSEA MEDICATION  *UNUSUAL SHORTNESS OF BREATH  *UNUSUAL BRUISING OR BLEEDING  TENDERNESS IN MOUTH AND THROAT WITH OR WITHOUT PRESENCE OF ULCERS  *URINARY PROBLEMS  *BOWEL PROBLEMS  UNUSUAL RASH Items with * indicate a potential emergency and should be followed up as soon as possible.  Feel free to call the clinic should you have any questions or concerns. The clinic phone number is [phone].  Please show the CHEMO ALERT CARD at check-in to the Emergency Department and triage nurse.   

## 2020-05-03 LAB — KAPPA/LAMBDA LIGHT CHAINS
Kappa free light chain: 6 mg/L (ref 3.3–19.4)
Kappa, lambda light chain ratio: 1.09 (ref 0.26–1.65)
Lambda free light chains: 5.5 mg/L — ABNORMAL LOW (ref 5.7–26.3)

## 2020-05-04 LAB — MULTIPLE MYELOMA PANEL, SERUM
Albumin SerPl Elph-Mcnc: 3.4 g/dL (ref 2.9–4.4)
Albumin/Glob SerPl: 1.4 (ref 0.7–1.7)
Alpha 1: 0.2 g/dL (ref 0.0–0.4)
Alpha2 Glob SerPl Elph-Mcnc: 0.9 g/dL (ref 0.4–1.0)
B-Globulin SerPl Elph-Mcnc: 0.9 g/dL (ref 0.7–1.3)
Gamma Glob SerPl Elph-Mcnc: 0.5 g/dL (ref 0.4–1.8)
Globulin, Total: 2.5 g/dL (ref 2.2–3.9)
IgA: 19 mg/dL — ABNORMAL LOW (ref 61–437)
IgG (Immunoglobin G), Serum: 583 mg/dL — ABNORMAL LOW (ref 603–1613)
IgM (Immunoglobulin M), Srm: 38 mg/dL (ref 20–172)
M Protein SerPl Elph-Mcnc: 0.2 g/dL — ABNORMAL HIGH
Total Protein ELP: 5.9 g/dL — ABNORMAL LOW (ref 6.0–8.5)

## 2020-05-06 ENCOUNTER — Ambulatory Visit: Payer: Self-pay

## 2020-05-06 ENCOUNTER — Ambulatory Visit (INDEPENDENT_AMBULATORY_CARE_PROVIDER_SITE_OTHER): Payer: Medicare (Managed Care) | Admitting: Orthopedic Surgery

## 2020-05-06 ENCOUNTER — Ambulatory Visit (INDEPENDENT_AMBULATORY_CARE_PROVIDER_SITE_OTHER): Payer: Medicare (Managed Care)

## 2020-05-06 ENCOUNTER — Encounter: Payer: Self-pay | Admitting: Orthopedic Surgery

## 2020-05-06 VITALS — Ht 68.0 in | Wt 215.0 lb

## 2020-05-06 DIAGNOSIS — M899 Disorder of bone, unspecified: Secondary | ICD-10-CM

## 2020-05-06 NOTE — Progress Notes (Signed)
Office Visit Note   Patient: [name]           Date of Birth: 22-Dec-1955           MRN: [id number] Visit Date: 05/06/2020              Requested by: [name], MD [address] [place],  [place] [address] PCP: [name], MD  Chief Complaint  Patient presents with  . Right Leg - Follow-up    Bilateral multiple myeloma lytic lesion bilateral femur  . Left Leg - Follow-up      HPI: Patient is a 64 year old gentleman with multiple myeloma lytic lesions of both femurs.  Patient states he feels well has no questions or concerns.  Assessment & Plan: Visit Diagnoses:  1. Lytic bone lesion of right femur   2. Lytic bone lesion of left femur     Plan: Patient has no advancement of the metastatic lesions these appear stable.  Discussed risks and benefits including observation versus prophylactic nailing.  I feel it safe for the patient to continue with observation at this time we will repeat radiographs of both femurs at follow-up.  Follow-Up Instructions: Return in about 3 months (around 08/06/2020).   Ortho Exam  Patient is alert, oriented, no adenopathy, well-dressed, normal affect, normal respiratory effort. Examination patient is asymptomatic he has lytic lesions which have not changed since his last visit.  Patient is nonambulatory in a wheelchair with paralysis.  He is full assist for transfers.  Imaging: XR FEMUR MIN 2 VIEWS LEFT  Result Date: 05/06/2020 2 view radiographs of the left femur shows a small metastatic lesion of the midshaft involving less than 25% of the cortex.  No images are attached to the encounter.  Labs: Lab Results  Component Value Date   HGBA1C 6.0 (H) 07/22/2014   ESRSEDRATE 73 (H) 10/21/2013   ESRSEDRATE 55 (H) 07/23/2013   ESRSEDRATE 95 (H) 07/13/2010   CRP 1.0 (H) 07/14/2010   LABURIC 8.2 (H) 07/21/2014   LABURIC 5.1 09/02/2010   REPTSTATUS 01/25/2020 FINAL 01/21/2020   CULT (A) 01/21/2020    >=100,000  COLONIES/mL PROTEUS MIRABILIS 20,000 COLONIES/mL STAPHYLOCOCCUS AUREUS    LABORGA PROTEUS MIRABILIS (A) 01/21/2020   LABORGA STAPHYLOCOCCUS AUREUS (A) 01/21/2020     Lab Results  Component Value Date   ALBUMIN 3.4 (L) 04/30/2020   ALBUMIN 3.5 04/23/2020   ALBUMIN 3.7 04/15/2020   LABURIC 8.2 (H) 07/21/2014   LABURIC 5.1 09/02/2010    Lab Results  Component Value Date   MG 1.8 01/21/2020   MG 1.5 07/21/2014   MG 1.3 (L) 02/13/2013   Lab Results  Component Value Date   VD25OH 59 04/08/2013   VD25OH 58 01/16/2013   VD25OH 63 11/15/2012    No results found for: PREALBUMIN CBC EXTENDED Latest Ref Rng & Units 04/30/2020 04/23/2020 04/15/2020  WBC 4.0 - 10.5 K/uL 6.7 6.0 7.1  RBC 4.22 - 5.81 MIL/uL 4.19(L) 4.14(L) 4.34  HGB 13.0 - 17.0 g/dL 12.1(L) 12.2(L) 12.8(L)  HCT 39 - 52 % 34.6(L) 34.5(L) 36.6(L)  PLT 150 - 400 K/uL 298 256 287  NEUTROABS 1.7 - 7.7 K/uL 5.2 4.2 5.2  LYMPHSABS 0.7 - 4.0 K/uL 0.7 0.8 1.0     Body mass index is 32.69 kg/m.  Orders:  Orders Placed This Encounter  Procedures  . XR FEMUR, MIN 2 VIEWS RIGHT  . XR FEMUR MIN 2 VIEWS LEFT   No orders of the defined types  were placed in this encounter.    Procedures: No procedures performed  Clinical Data: No additional findings.  ROS:  All other systems negative, except as noted in the HPI. Review of Systems  Objective: Vital Signs: Ht _0  (1.727 m)   Wt 215 lb (97.5 kg)   BMI 32.69 kg/m   Specialty Comments:  No specialty comments available.  PMFS History: Patient Active Problem List   Diagnosis Date Noted  . Goals of care, counseling/discussion 10/09/2019  . On rivaroxaban therapy   . Status post colonoscopy with polypectomy   . Lower GI bleeding 04/18/2017  . Acute blood loss anemia 04/18/2017  . Diabetes mellitus type 2 in obese ([place]) 04/18/2017  . Colon cancer screening   . Benign neoplasm of ascending colon   . History of DVT (deep vein thrombosis) 06/10/2015  .  Obstructed suprapubic catheter ([place]) 07/21/2014  . Bilateral hydronephrosis 07/21/2014  . Acute kidney failure ([place]) 07/20/2014  . Abdominal pain 07/20/2014  . AKI (acute kidney injury) ([place]) 07/20/2014  . Hematuria 02/12/2013  . Shock ([place]) 02/12/2013  . Adrenal insufficiency ([place]) 02/12/2013  . Hypoalbuminemia 11/15/2012  . Anemia, unspecified 09/18/2012  . Unspecified vitamin D deficiency 09/18/2012  . 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 Iron deficiency anemia 09/18/2012  . Diverticulitis of large intestine with perforation 07/26/2011  . Multiple myeloma in relapse ([place]) 10/14/2010  . Neurogenic bowel 10/14/2010  . Paraplegia ([place]) 07/13/2010  . Neurogenic bladder 07/11/2010  . BACK PAIN, LUMBAR, WITH RADICULOPATHY 07/07/2010  . DISEQUILIBRIUM 07/07/2010  . Abdominal pain, generalized 07/07/2010  . OBESITY, NOS 09/20/2006   Past Medical History:  Diagnosis Date  . Adrenal insufficiency (HCC)    on chronic dexamethasone  . Anemia   . Cancer ([place])   . Coagulopathy ([place])    on xeralto/ s/p DVT while on coumadin,  IVC in place  . Diabetes mellitus without complication ([place])    type 2  . Gross hematuria 7/14   post foley cath procedure  . History of blood transfusion 7/14  . Multiple myeloma    thoracic T8 with paraplegia s/p resection- on chemo at visit 10/13/10  . Multiple myeloma   . Multiple myeloma without mention of remission   . Neurogenic bladder   . Neurogenic bowel   . Paraplegia ([name])   . Partial small bowel obstruction ([place]) during dec 2011 admission    Family History  Problem Relation Age of Onset  . Ovarian cancer Mother   . Diabetes Father     Past Surgical History:  Procedure Laterality Date  . COLONOSCOPY WITH PROPOFOL N/A 04/12/2017   Procedure: COLONOSCOPY WITH PROPOFOL;  Surgeon: Irene Shipper, MD;  [facility];  Service: Endoscopy;  Laterality: N/A;  . COLONOSCOPY WITH PROPOFOL N/A 04/19/2017    Procedure: COLONOSCOPY WITH PROPOFOL;  Surgeon: Yetta Flock, MD;  [facility];  Service: Gastroenterology;  Laterality: N/A;  . COLOSTOMY  07/20/2011   Procedure: COLOSTOMY;  Surgeon: Judieth Keens, DO;  Location: [facility];  Service: General;;  . COLOSTOMY REVISION  07/20/2011   Procedure: COLON RESECTION SIGMOID;  Surgeon: Judieth Keens, DO;  Location: [facility];  Service: General;;  . CYSTOSCOPY N/A 04/04/2013   Procedure: CYSTOSCOPY WITH LITHALOPAXY;  Surgeon: Alexis Frock, MD;  [facility];  Service: Urology;  Laterality: N/A;  . INSERTION OF SUPRAPUBIC CATHETER N/A 04/04/2013   Procedure: INSERTION OF SUPRAPUBIC CATHETER;  Surgeon: Alexis Frock, MD;  [facility];  Service: Urology;  Laterality: N/A;  . LAPAROTOMY  07/20/2011   Procedure: EXPLORATORY LAPAROTOMY;  Surgeon: Judieth Keens, DO;  Location: [facility];  Service: General;  Laterality: N/A;  . myeloma thoracic T8 with parpaplegia s/p thoracotomy and thoracic T7-9 cage placement on Dec 26th 2011  07/18/10   Social History   Occupational History  . Not on file  Tobacco Use  . Smoking status: Never Smoker  . Smokeless tobacco: Never Used  Vaping Use  . Vaping Use: Never used  Substance and Sexual Activity  . Alcohol use: No  . Drug use: No  . Sexual activity: Never

## 2020-05-07 ENCOUNTER — Inpatient Hospital Stay: Payer: Medicare (Managed Care)

## 2020-05-07 ENCOUNTER — Other Ambulatory Visit: Payer: Self-pay

## 2020-05-07 ENCOUNTER — Other Ambulatory Visit: Payer: Self-pay | Admitting: Hematology and Oncology

## 2020-05-07 VITALS — BP 100/79 | HR 99 | Temp 98.3°F | Resp 19

## 2020-05-07 DIAGNOSIS — Z5112 Encounter for antineoplastic immunotherapy: Secondary | ICD-10-CM | POA: Diagnosis not present

## 2020-05-07 DIAGNOSIS — C9002 Multiple myeloma in relapse: Secondary | ICD-10-CM

## 2020-05-07 DIAGNOSIS — Z7189 Other specified counseling: Secondary | ICD-10-CM

## 2020-05-07 LAB — CMP (CANCER CENTER ONLY)
ALT: 17 U/L (ref 0–44)
AST: 13 U/L — ABNORMAL LOW (ref 15–41)
Albumin: 3.4 g/dL — ABNORMAL LOW (ref 3.5–5.0)
Alkaline Phosphatase: 77 U/L (ref 38–126)
Anion gap: 5 (ref 5–15)
BUN: 14 mg/dL (ref 8–23)
CO2: 31 mmol/L (ref 22–32)
Calcium: 9.6 mg/dL (ref 8.9–10.3)
Chloride: 106 mmol/L (ref 98–111)
Creatinine: 0.78 mg/dL (ref 0.61–1.24)
GFR, Estimated: >60 mL/min (ref >60)
Glucose, Bld: 106 mg/dL — ABNORMAL HIGH (ref 70–99)
Potassium: 3.7 mmol/L (ref 3.5–5.1)
Sodium: 142 mmol/L (ref 135–145)
Total Bilirubin: 0.4 mg/dL (ref 0.3–1.2)
Total Protein: 6.7 g/dL (ref 6.5–8.1)

## 2020-05-07 LAB — CBC WITH DIFFERENTIAL (CANCER CENTER ONLY)
Abs Immature Granulocytes: 0.02 10*3/uL (ref 0.00–0.07)
Basophils Absolute: 0 10*3/uL (ref 0.0–0.1)
Basophils Relative: 1 %
Eosinophils Absolute: 0.4 10*3/uL (ref 0.0–0.5)
Eosinophils Relative: 6 %
HCT: 35.2 % — ABNORMAL LOW (ref 39.0–52.0)
Hemoglobin: 12.3 g/dL — ABNORMAL LOW (ref 13.0–17.0)
Immature Granulocytes: 0 %
Lymphocytes Relative: 14 %
Lymphs Abs: 0.9 10*3/uL (ref 0.7–4.0)
MCH: 28.8 pg (ref 26.0–34.0)
MCHC: 34.9 g/dL (ref 30.0–36.0)
MCV: 82.4 fL (ref 80.0–100.0)
Monocytes Absolute: 0.5 10*3/uL (ref 0.1–1.0)
Monocytes Relative: 9 %
Neutro Abs: 4.3 10*3/uL (ref 1.7–7.7)
Neutrophils Relative %: 70 %
Platelet Count: 266 10*3/uL (ref 150–400)
RBC: 4.27 MIL/uL (ref 4.22–5.81)
RDW: 14.8 % (ref 11.5–15.5)
WBC Count: 6.2 10*3/uL (ref 4.0–10.5)
nRBC: 0 % (ref 0.0–0.2)

## 2020-05-07 LAB — LACTATE DEHYDROGENASE: LDH: 122 U/L (ref 98–192)

## 2020-05-07 MED ORDER — BORTEZOMIB CHEMO SQ INJECTION 3.5 MG (2.5MG/ML)
1.3000 mg/m2 | Freq: Once | INTRAMUSCULAR | Status: AC
  Administered 2020-05-07: 2.75 mg via SUBCUTANEOUS
  Filled 2020-05-07: qty 1.1

## 2020-05-07 MED ORDER — DEXAMETHASONE 4 MG PO TABS
20.0000 mg | ORAL_TABLET | Freq: Once | ORAL | Status: AC
  Administered 2020-05-07: 20 mg via ORAL

## 2020-05-07 NOTE — Patient Instructions (Signed)
[facility] Discharge Instructions for Patients Receiving Chemotherapy  Today you received the following chemotherapy agents Velcade.  To help prevent nausea and vomiting after your treatment, we encourage you to take your nausea medication as directed.  If you develop nausea and vomiting that is not controlled by your nausea medication, call the clinic.   BELOW ARE SYMPTOMS THAT SHOULD BE REPORTED IMMEDIATELY:  *FEVER GREATER THAN 100.5 F  *CHILLS WITH OR WITHOUT FEVER  NAUSEA AND VOMITING THAT IS NOT CONTROLLED WITH YOUR NAUSEA MEDICATION  *UNUSUAL SHORTNESS OF BREATH  *UNUSUAL BRUISING OR BLEEDING  TENDERNESS IN MOUTH AND THROAT WITH OR WITHOUT PRESENCE OF ULCERS  *URINARY PROBLEMS  *BOWEL PROBLEMS  UNUSUAL RASH Items with * indicate a potential emergency and should be followed up as soon as possible.  Feel free to call the clinic should you have any questions or concerns. The clinic phone number is [phone].  Please show the CHEMO ALERT CARD at check-in to the Emergency Department and triage nurse.   

## 2020-05-11 LAB — UPEP/UIFE/LIGHT CHAINS/TP, 24-HR UR
% BETA, Urine: 24.9 %
ALPHA 1 URINE: 4.6 %
Albumin, U: 51.6 %
Alpha 2, Urine: 7.5 %
Free Kappa Lt Chains,Ur: 2.47 mg/L (ref 0.63–113.79)
Free Kappa/Lambda Ratio: >3.69 (ref 1.03–31.76)
Free Lambda Lt Chains,Ur: <0.67 mg/L (ref 0.47–11.77)
GAMMA GLOBULIN URINE: 11.4 %
Total Protein, Urine-Ur/day: 1833 mg/24 hr — ABNORMAL HIGH (ref 30–150)
Total Protein, Urine: 63.2 mg/dL
Total Volume: 2900

## 2020-05-14 ENCOUNTER — Other Ambulatory Visit: Payer: Self-pay

## 2020-05-14 ENCOUNTER — Other Ambulatory Visit: Payer: Medicare (Managed Care)

## 2020-05-14 ENCOUNTER — Inpatient Hospital Stay: Payer: Medicare (Managed Care)

## 2020-05-14 VITALS — BP 95/69 | HR 75 | Temp 98.2°F | Resp 18

## 2020-05-14 DIAGNOSIS — Z5112 Encounter for antineoplastic immunotherapy: Secondary | ICD-10-CM | POA: Diagnosis not present

## 2020-05-14 DIAGNOSIS — Z7189 Other specified counseling: Secondary | ICD-10-CM

## 2020-05-14 DIAGNOSIS — C9002 Multiple myeloma in relapse: Secondary | ICD-10-CM

## 2020-05-14 LAB — CBC WITH DIFFERENTIAL (CANCER CENTER ONLY)
Abs Immature Granulocytes: 0.02 10*3/uL (ref 0.00–0.07)
Basophils Absolute: 0.1 10*3/uL (ref 0.0–0.1)
Basophils Relative: 1 %
Eosinophils Absolute: 0.4 10*3/uL (ref 0.0–0.5)
Eosinophils Relative: 6 %
HCT: 35.9 % — ABNORMAL LOW (ref 39.0–52.0)
Hemoglobin: 12.5 g/dL — ABNORMAL LOW (ref 13.0–17.0)
Immature Granulocytes: 0 %
Lymphocytes Relative: 15 %
Lymphs Abs: 1 10*3/uL (ref 0.7–4.0)
MCH: 28.3 pg (ref 26.0–34.0)
MCHC: 34.8 g/dL (ref 30.0–36.0)
MCV: 81.4 fL (ref 80.0–100.0)
Monocytes Absolute: 0.7 10*3/uL (ref 0.1–1.0)
Monocytes Relative: 12 %
Neutro Abs: 4.2 10*3/uL (ref 1.7–7.7)
Neutrophils Relative %: 66 %
Platelet Count: 282 10*3/uL (ref 150–400)
RBC: 4.41 MIL/uL (ref 4.22–5.81)
RDW: 14.7 % (ref 11.5–15.5)
WBC Count: 6.3 10*3/uL (ref 4.0–10.5)
nRBC: 0 % (ref 0.0–0.2)

## 2020-05-14 LAB — CMP (CANCER CENTER ONLY)
ALT: 23 U/L (ref 0–44)
AST: 16 U/L (ref 15–41)
Albumin: 3.7 g/dL (ref 3.5–5.0)
Alkaline Phosphatase: 82 U/L (ref 38–126)
Anion gap: 10 (ref 5–15)
BUN: 17 mg/dL (ref 8–23)
CO2: 25 mmol/L (ref 22–32)
Calcium: 9.7 mg/dL (ref 8.9–10.3)
Chloride: 107 mmol/L (ref 98–111)
Creatinine: 0.78 mg/dL (ref 0.61–1.24)
GFR, Estimated: >60 mL/min (ref >60)
Glucose, Bld: 98 mg/dL (ref 70–99)
Potassium: 3.7 mmol/L (ref 3.5–5.1)
Sodium: 142 mmol/L (ref 135–145)
Total Bilirubin: 0.4 mg/dL (ref 0.3–1.2)
Total Protein: 6.8 g/dL (ref 6.5–8.1)

## 2020-05-14 LAB — LACTATE DEHYDROGENASE: LDH: 114 U/L (ref 98–192)

## 2020-05-14 MED ORDER — ACETAMINOPHEN 325 MG PO TABS
650.0000 mg | ORAL_TABLET | Freq: Once | ORAL | Status: AC
  Administered 2020-05-14: 650 mg via ORAL

## 2020-05-14 MED ORDER — DARATUMUMAB-HYALURONIDASE-FIHJ 1800-30000 MG-UT/15ML ~~LOC~~ SOLN
1800.0000 mg | Freq: Once | SUBCUTANEOUS | Status: AC
  Administered 2020-05-14: 1800 mg via SUBCUTANEOUS
  Filled 2020-05-14: qty 15

## 2020-05-14 MED ORDER — DIPHENHYDRAMINE HCL 25 MG PO CAPS
25.0000 mg | ORAL_CAPSULE | Freq: Once | ORAL | Status: AC
  Administered 2020-05-14: 25 mg via ORAL

## 2020-05-14 MED ORDER — BORTEZOMIB CHEMO SQ INJECTION 3.5 MG (2.5MG/ML)
1.3000 mg/m2 | Freq: Once | INTRAMUSCULAR | Status: AC
  Administered 2020-05-14: 2.75 mg via SUBCUTANEOUS
  Filled 2020-05-14: qty 1.1

## 2020-05-14 MED ORDER — DEXAMETHASONE 4 MG PO TABS
20.0000 mg | ORAL_TABLET | Freq: Once | ORAL | Status: AC
  Administered 2020-05-14: 20 mg via ORAL

## 2020-05-14 NOTE — Progress Notes (Signed)
Patient declined faspro post observation.  Patient had no complaints, and no distress noted.  Patient wheeled himself from faciltiy.

## 2020-05-14 NOTE — Patient Instructions (Signed)
[facility] Discharge Instructions for Patients Receiving Chemotherapy  Today you received the following chemotherapy agents Velcade, darzalex  To help prevent nausea and vomiting after your treatment, we encourage you to take your nausea medication as directed.   If you develop nausea and vomiting that is not controlled by your nausea medication, call the clinic.   BELOW ARE SYMPTOMS THAT SHOULD BE REPORTED IMMEDIATELY:  *FEVER GREATER THAN 100.5 F  *CHILLS WITH OR WITHOUT FEVER  NAUSEA AND VOMITING THAT IS NOT CONTROLLED WITH YOUR NAUSEA MEDICATION  *UNUSUAL SHORTNESS OF BREATH  *UNUSUAL BRUISING OR BLEEDING  TENDERNESS IN MOUTH AND THROAT WITH OR WITHOUT PRESENCE OF ULCERS  *URINARY PROBLEMS  *BOWEL PROBLEMS  UNUSUAL RASH Items with * indicate a potential emergency and should be followed up as soon as possible.  Feel free to call the clinic should you have any questions or concerns. The clinic phone number is [phone].  Please show the [place] at check-in to the Emergency Department and triage nurse.

## 2020-05-20 ENCOUNTER — Inpatient Hospital Stay: Payer: Medicare (Managed Care)

## 2020-05-20 ENCOUNTER — Other Ambulatory Visit: Payer: Self-pay

## 2020-05-20 ENCOUNTER — Inpatient Hospital Stay (HOSPITAL_BASED_OUTPATIENT_CLINIC_OR_DEPARTMENT_OTHER): Payer: Medicare (Managed Care) | Admitting: Hematology and Oncology

## 2020-05-20 ENCOUNTER — Other Ambulatory Visit: Payer: Medicare (Managed Care)

## 2020-05-20 VITALS — BP 118/70 | HR 80 | Temp 99.4°F | Resp 18 | Wt 225.0 lb

## 2020-05-20 DIAGNOSIS — Z7189 Other specified counseling: Secondary | ICD-10-CM

## 2020-05-20 DIAGNOSIS — Z5111 Encounter for antineoplastic chemotherapy: Secondary | ICD-10-CM

## 2020-05-20 DIAGNOSIS — C9002 Multiple myeloma in relapse: Secondary | ICD-10-CM

## 2020-05-20 DIAGNOSIS — M87180 Osteonecrosis due to drugs, jaw: Secondary | ICD-10-CM | POA: Diagnosis not present

## 2020-05-20 DIAGNOSIS — Z5112 Encounter for antineoplastic immunotherapy: Secondary | ICD-10-CM | POA: Diagnosis not present

## 2020-05-20 LAB — CMP (CANCER CENTER ONLY)
ALT: 24 U/L (ref 0–44)
AST: 15 U/L (ref 15–41)
Albumin: 3.7 g/dL (ref 3.5–5.0)
Alkaline Phosphatase: 81 U/L (ref 38–126)
Anion gap: 9 (ref 5–15)
BUN: 16 mg/dL (ref 8–23)
CO2: 28 mmol/L (ref 22–32)
Calcium: 9.8 mg/dL (ref 8.9–10.3)
Chloride: 103 mmol/L (ref 98–111)
Creatinine: 0.78 mg/dL (ref 0.61–1.24)
GFR, Estimated: >60 mL/min (ref >60)
Glucose, Bld: 95 mg/dL (ref 70–99)
Potassium: 4 mmol/L (ref 3.5–5.1)
Sodium: 140 mmol/L (ref 135–145)
Total Bilirubin: 0.4 mg/dL (ref 0.3–1.2)
Total Protein: 7.1 g/dL (ref 6.5–8.1)

## 2020-05-20 LAB — CBC WITH DIFFERENTIAL (CANCER CENTER ONLY)
Abs Immature Granulocytes: 0.02 10*3/uL (ref 0.00–0.07)
Basophils Absolute: 0 10*3/uL (ref 0.0–0.1)
Basophils Relative: 1 %
Eosinophils Absolute: 0.4 10*3/uL (ref 0.0–0.5)
Eosinophils Relative: 6 %
HCT: 38 % — ABNORMAL LOW (ref 39.0–52.0)
Hemoglobin: 13.1 g/dL (ref 13.0–17.0)
Immature Granulocytes: 0 %
Lymphocytes Relative: 12 %
Lymphs Abs: 1 10*3/uL (ref 0.7–4.0)
MCH: 28.1 pg (ref 26.0–34.0)
MCHC: 34.5 g/dL (ref 30.0–36.0)
MCV: 81.4 fL (ref 80.0–100.0)
Monocytes Absolute: 0.7 10*3/uL (ref 0.1–1.0)
Monocytes Relative: 9 %
Neutro Abs: 5.8 10*3/uL (ref 1.7–7.7)
Neutrophils Relative %: 72 %
Platelet Count: 289 10*3/uL (ref 150–400)
RBC: 4.67 MIL/uL (ref 4.22–5.81)
RDW: 14.9 % (ref 11.5–15.5)
WBC Count: 8 10*3/uL (ref 4.0–10.5)
nRBC: 0 % (ref 0.0–0.2)

## 2020-05-20 LAB — LACTATE DEHYDROGENASE: LDH: 120 U/L (ref 98–192)

## 2020-05-20 MED ORDER — DEXAMETHASONE 4 MG PO TABS
20.0000 mg | ORAL_TABLET | Freq: Once | ORAL | Status: AC
  Administered 2020-05-20: 20 mg via ORAL

## 2020-05-20 MED ORDER — BORTEZOMIB CHEMO SQ INJECTION 3.5 MG (2.5MG/ML)
1.3000 mg/m2 | Freq: Once | INTRAMUSCULAR | Status: AC
  Administered 2020-05-20: 2.75 mg via SUBCUTANEOUS
  Filled 2020-05-20: qty 1.1

## 2020-05-20 MED ORDER — DEXAMETHASONE 4 MG PO TABS
ORAL_TABLET | ORAL | Status: AC
  Filled 2020-05-20: qty 5

## 2020-05-20 NOTE — Progress Notes (Signed)
[facility] [place] Telephone:([phone]) [phone]   [phone]  PROGRESS NOTE  Patient Care Team: [name], MD as PCP - General ([facility] Medicine) [name], MD as Consulting Physician (Physical Medicine and Rehabilitation) Donia Guiles Lavon Paganini, PA-C as Physician Assistant (Physical Medicine and Rehabilitation) [name], MD as Consulting Physician (Hematology and Oncology)  Hematological/Oncological History # IgG Lambda Multiple Myeloma, Relapsed (ISS Stage II) 1) 06/2010: initial diagnosis of Multiple Myeloma after T8 compression fracture. Treated with Velcade/Revlimid/Dexamethasone and achieved a complete remission 2) Velcade was discontinued in September 2012 and that Revlimid and Decadron were discontinued in March 2013. 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 4) Followed by Dr. Alvy Bimler, last clinic visit 10/09/2019. At that time there was concern for relapse of his multiple myeloma.  5) Patient requested transfer to different provider after misunderstanding regarding imaging studies 6) 12/17/2019: transfer care to Dr. Lorenso Courier  7) 01/09/2020: Cycle 1 Day 1 of Dara/Velcade/Dex 8) 01/21/2020: presented as urgent visit for diarrhea and dehydration. Holding chemotherapy scheduled for 01/23/2020. 9) 01/30/2020: Resume dara/velcade/dex after resolution of diarrhea.  10) 02/13/2020: restaging labs show M protein 0.8, Kappa 4.5, lamba 17.2, ratio 0.26, urine M protein 53 (7.1%). All MM labs indicate improvement.  11) 03/10/2020: Cycle 4 Day 1 of Dara/Velcade/Dex. Transition to q 3 week daratumumab.   Interval History:  [name] 64 y.o. male with medical history significant for  IgG Lambda Multiple Myeloma who presents for a follow up visit. The patient's last visit was on 04/30/2020. In the interim he has had no changes in his health status.   On exam today [name] is  at his baseline level of health.  He remains optimistic in good spirit.  He notes that he has not had any issues with his chemotherapy treatments in the interim since her last visit.  He denies having increased ostomy output Gary urinary symptoms.  He denies having any issues with neuropathy in his hands.  He reports no fevers, chills, sweats, nausea, vomiting Gary diarrhea.  Overall he is in excellent spirits and ready to proceed forward.  A full 10 point ROS is listed below.  MEDICAL HISTORY:  Past Medical History:  Diagnosis Date  . Adrenal insufficiency (HCC)    on chronic dexamethasone  . Anemia   . Cancer ([place])   . Coagulopathy ([place])    on xeralto/ s/p DVT while on coumadin,  IVC in place  . Diabetes mellitus without complication ([place])    type 2  . Gross hematuria 7/14   post foley cath procedure  . History of blood transfusion 7/14  . Multiple myeloma    thoracic T8 with paraplegia s/p resection- on chemo at visit 10/13/10  . Multiple myeloma   . Multiple myeloma without mention of remission   . Neurogenic bladder   . Neurogenic bowel   . Paraplegia ([place])   . Partial small bowel obstruction ([place]) during dec 2011 admission    SURGICAL HISTORY: Past Surgical History:  Procedure Laterality Date  . COLONOSCOPY WITH PROPOFOL N/A 04/12/2017   Procedure: COLONOSCOPY WITH PROPOFOL;  Surgeon: Irene Shipper, MD;  Location: [facility];  Service: Endoscopy;  Laterality: N/A;  . COLONOSCOPY WITH PROPOFOL N/A 04/19/2017   Procedure: COLONOSCOPY WITH PROPOFOL;  Surgeon: Yetta Flock, MD;  Location: [facility];  Service: Gastroenterology;  Laterality: N/A;  . COLOSTOMY  07/20/2011   Procedure: COLOSTOMY;  Surgeon:  Judieth Keens, DO;  Location: [facility];  Service: General;;  . COLOSTOMY REVISION  07/20/2011   Procedure: COLON RESECTION SIGMOID;  Surgeon: Judieth Keens, DO;  Location: [facility];  Service: General;;  . CYSTOSCOPY N/A 04/04/2013   Procedure: CYSTOSCOPY WITH  LITHALOPAXY;  Surgeon: Alexis Frock, MD;  Location: [facility];  Service: Urology;  Laterality: N/A;  . INSERTION OF SUPRAPUBIC CATHETER N/A 04/04/2013   Procedure: INSERTION OF SUPRAPUBIC CATHETER;  Surgeon: Alexis Frock, MD;  Location: [facility];  Service: Urology;  Laterality: N/A;  . LAPAROTOMY  07/20/2011   Procedure: EXPLORATORY LAPAROTOMY;  Surgeon: Judieth Keens, DO;  Location: [facility];  Service: General;  Laterality: N/A;  . myeloma thoracic T8 with parpaplegia s/p thoracotomy and thoracic T7-9 cage placement on Dec 26th 2011  07/18/10    SOCIAL HISTORY: Social History   Socioeconomic History  . Marital status: Married    Spouse Howell: Not on file  . Number of children: Not on file  . Years of education: Not on file  . Highest education level: Not on file  Occupational History  . Not on file  Tobacco Use  . Smoking status: Never Smoker  . Smokeless tobacco: Never Used  Vaping Use  . Vaping Use: Never used  Substance and Sexual Activity  . Alcohol use: No  . Drug use: No  . Sexual activity: Never  Other Topics Concern  . Not on file  Social History Narrative  . Not on file   Social Determinants of Health   Financial Resource Strain:   . Difficulty of Paying Living Expenses: Not on file  Food Insecurity:   . Worried About Charity fundraiser in the Last Year: Not on file  . Ran Out of Food in the Last Year: Not on file  Transportation Needs:   . Lack of Transportation (Medical): Not on file  . Lack of Transportation (Non-Medical): Not on file  Physical Activity:   . Days of Exercise per Week: Not on file  . Minutes of Exercise per Session: Not on file  Stress:   . Feeling of Stress : Not on file  Social Connections:   . Frequency of Communication with Friends and Gary: Not on file  . Frequency of Social Gatherings with Friends and Gary: Not on file  . Attends Religious Services: Not on file  . Active Member of Clubs Gary Organizations: Not on file  .  Attends Archivist Meetings: Not on file  . Marital Status: Not on file  Intimate Partner Violence:   . Fear of Current Gary Ex-Partner: Not on file  . Emotionally Abused: Not on file  . Physically Abused: Not on file  . Sexually Abused: Not on file    Gary HISTORY: Gary History  Problem Relation Age of Onset  . Ovarian cancer Mother   . Diabetes Father     ALLERGIES:  is allergic to feraheme [ferumoxytol].  MEDICATIONS:  Current Outpatient Medications  Medication Sig Dispense Refill  . acyclovir (ZOVIRAX) 400 MG tablet Take 1 tablet (400 mg total) by mouth 2 (two) times daily. 60 tablet 2  . atorvastatin (LIPITOR) 10 MG tablet Take 10 mg by mouth daily.    . baclofen (LIORESAL) 20 MG tablet Take 20 mg by mouth 2 (two) times daily.    . diazepam (DIASTAT ACUDIAL) 10 MG GEL SMARTSIG:By Mouth    . dorzolamide (TRUSOPT) 2 % ophthalmic solution 1 drop 2 (two) times daily.    Marland Kitchen  iron polysaccharides (NU-IRON) 150 MG capsule Take 1 capsule (150 mg total) by mouth daily. 30 capsule 0  . latanoprost (XALATAN) 0.005 % ophthalmic solution Place 1 drop into both eyes at bedtime.    . metFORMIN (GLUCOPHAGE) 500 MG tablet Take 500 mg by mouth 2 (two) times daily with a meal.    . ondansetron (ZOFRAN) 8 MG tablet Take 1 tablet (8 mg total) by mouth every 8 (eight) hours as needed for nausea Gary vomiting. 30 tablet 0  . rivaroxaban (XARELTO) 10 MG TABS tablet Take 1 tablet (10 mg total) by mouth daily with supper. 30 tablet 9  . Skin Protectants, Misc. (EUCERIN) cream Apply 1 application topically 2 (two) times daily as needed for dry skin.     Marland Kitchen zinc oxide (BALMEX) 11.3 % CREA cream Apply 1 application topically 2 (two) times daily.     No current facility-administered medications for this visit.    REVIEW OF SYSTEMS:   Constitutional: ( - ) fevers, ( - )  chills , ( - ) night sweats Eyes: ( - ) blurriness of vision, ( - ) double vision, ( - ) watery eyes Ears, nose, mouth,  throat, and face: ( - ) mucositis, ( - ) sore throat Respiratory: ( - ) cough, ( - ) dyspnea, ( - ) wheezes Cardiovascular: ( - ) palpitation, ( - ) chest discomfort, ( - ) lower extremity swelling Gastrointestinal:  ( - ) nausea, ( - ) heartburn, ( - ) change in bowel habits Skin: ( - ) abnormal skin rashes Lymphatics: ( - ) new lymphadenopathy, ( - ) easy bruising Neurological: ( - ) numbness, ( - ) tingling, ( - ) new weaknesses Behavioral/Psych: ( - ) mood change, ( - ) new changes  All other systems were reviewed with the patient and are negative.  PHYSICAL EXAMINATION: ECOG PERFORMANCE STATUS: paraplegic.   There were no vitals filed for this visit. There were no vitals filed for this visit.  GENERAL: well appearing middle aged [place] American male alert, no distress and comfortable SKIN: skin color, texture, turgor are normal, no rashes Gary significant lesions EYES: conjunctiva are pink and non-injected, sclera clear LUNGS: clear to auscultation and percussion with normal breathing effort HEART: regular rate & rhythm and no murmurs and no lower extremity edema Musculoskeletal: no cyanosis of digits and no clubbing  PSYCH: alert & oriented x 3, fluent speech NEURO: paraplegic, no use of LE bilaterally.   LABORATORY DATA:  I have reviewed the data as listed CBC Latest Ref Rng & Units 05/28/2020 05/20/2020 05/14/2020  WBC 4.0 - 10.5 K/uL 5.9 8.0 6.3  Hemoglobin 13.0 - 17.0 g/dL 12.7(L) 13.1 12.5(L)  Hematocrit 39 - 52 % 36.7(L) 38.0(L) 35.9(L)  Platelets 150 - 400 K/uL 257 289 282    CMP Latest Ref Rng & Units 05/28/2020 05/20/2020 05/14/2020  Glucose 70 - 99 mg/dL 104(H) 95 98  BUN 8 - 23 mg/dL _0 Creatinine 0.61 - 1.24 mg/dL 0.78 0.78 0.78  Sodium 135 - 145 mmol/L 142 140 142  Potassium 3.5 - 5.1 mmol/L 3.8 4.0 3.7  Chloride 98 - 111 mmol/L 106 103 107  CO2 22 - 32 mmol/L _1 Calcium 8.9 - 10.3 mg/dL 9.3 9.8 9.7  Total Protein 6.5 - 8.1 g/dL 6.8 7.1 6.8    Total Bilirubin 0.3 - 1.2 mg/dL 0.3 0.4 0.4  Alkaline Phos 38 - 126 U/L 82 81 82  AST 15 - 41  U/L _0 ALT 0 - 44 U/L _1 Lab Results  Component Value Date   MPROTEIN 0.2 (H) 04/30/2020   MPROTEIN 0.3 (H) 03/17/2020   MPROTEIN 0.8 (H) 02/13/2020   Lab Results  Component Value Date   KPAFRELGTCHN 6.0 04/30/2020   KPAFRELGTCHN 4.8 03/17/2020   KPAFRELGTCHN 4.5 02/13/2020   LAMBDASER 5.5 (L) 04/30/2020   LAMBDASER 11.8 03/17/2020   LAMBDASER 17.2 02/13/2020   KAPLAMBRATIO >3.69 05/07/2020   KAPLAMBRATIO 1.09 04/30/2020   KAPLAMBRATIO 0.41 03/17/2020    RADIOGRAPHIC STUDIES: XR FEMUR MIN 2 VIEWS LEFT  Result Date: 05/06/2020 2 view radiographs of the left femur shows a small metastatic lesion of the midshaft involving less than 25% of the cortex.   ASSESSMENT & PLAN TEGH FRANEK 64 y.o. male with medical history significant for  IgG Lambda Multiple Myeloma who presents for a follow up visit.  After review of the labs, discussion with the patient, and reviewed the imaging his findings are most consistent with a relapsed multiple myeloma.The patient has had excellent success before with treatment of his myeloma with Velcade, Revlimid, and dexamethasone.  On exam today Mr. Cerrone reports he is tolerating treatment well with no additional symptoms at this time.  Overall he is at baseline.  No other new symptoms are present at this time.   Treatment regimen consists of bortezomib 1.62m/m2 subq (Day 1,8, and 15), daratumumab 1800 mg subq (day 1,8 and 15), and dexamethasone 20 mg PO (day 1, 8 and 15). After 3 cycles, daratumumab to be administered q 3 weeks (from cycles 4-8). After cycle 8, continue daratumumab q 4 weeks alone (N Engl J Med 2016; [id number]  # IgG Lambda Multiple Myeloma, Relapsed (ISS Stage II) --findings are most consistent with relapsed multiple myeloma. Patient previously successfully treated with Velcade/Rev/Dex --current plan is  Daratumumab/Velcade/Dex  (started 01/09/2020). Continue weekly velcade with q 3 week dara.   --recollect restaging MM labs monthly to include MM panel, SFLC. Additionally will collect LDH and beta 2 microglobulin.  --will have patient evaluated by dentistry prior to restarting Zometa therapy. He had an episode of osteonecrosis in the past, risk of restarting may outweight benefit.  --f/u in 3 weeks on 06/04/2020  #History of DVT --He had placement of IVC filter, remains on Xarelto. --Due to poor mobility, and lack of bleeding complications, I recommend he remain on Xarelto indefinitely. --caution if Plt count were to drop <50  # Supportive Care -- provided patient with an albuterol inhaler (for use with daratumumab) --acyclovir 409mBID for VZV prophylaxis --zofran 13m49m8H PRN for nausea/vomiting  --Zometa can be considered after dental clearance, though with his prior episode of osteonecrosis the risk may outweigh the benefits. --patient recieved pretreatment RBC phenotype  No orders of the defined types were placed in this encounter.  All questions were answered. The patient knows to call the clinic with any problems, questions Gary concerns.  A total of more than 30 minutes were spent on this encounter and over half of that time was spent on counseling and coordination of care as outlined above.   JohLedell PeoplesD Department of Hematology/Oncology [facility] [place]: [phone]: [phone]: [email or web address]  05/31/2020 7:23 AM   Literature Support:  PalArchie Endoeisel K, Raliegh Ipooka AK, Masszi Gary, [place]ekLakeside Citypi[place]ells[place] HunParkdale Munder M, MatLittle Rockark TM, Qi M, Schecter J, AmiAlpinein X, Deraedt  W, Ahmadi Gary, Spencer A, Sonneveld P; [employer] Investigators. Daratumumab, Bortezomib, and Dexamethasone for Multiple Myeloma. [place] [place] Med. 2016 Aug 25;375(8):754-66  --among patients with relapsed Gary relapsed and  refractory multiple myeloma, daratumumab in combination with bortezomib and dexamethasone resulted in significantly longer progression-free survival than bortezomib and dexamethasone alone and was associated with infusion-related reactions and higher rates of thrombocytopenia and neutropenia than bortezomib and dexamethasone alone.

## 2020-05-20 NOTE — Patient Instructions (Signed)
[facility] Discharge Instructions for Patients Receiving Chemotherapy  Today you received the following chemotherapy agents Velcade.  To help prevent nausea and vomiting after your treatment, we encourage you to take your nausea medication as directed.  If you develop nausea and vomiting that is not controlled by your nausea medication, call the clinic.   BELOW ARE SYMPTOMS THAT SHOULD BE REPORTED IMMEDIATELY:  *FEVER GREATER THAN 100.5 F  *CHILLS WITH OR WITHOUT FEVER  NAUSEA AND VOMITING THAT IS NOT CONTROLLED WITH YOUR NAUSEA MEDICATION  *UNUSUAL SHORTNESS OF BREATH  *UNUSUAL BRUISING OR BLEEDING  TENDERNESS IN MOUTH AND THROAT WITH OR WITHOUT PRESENCE OF ULCERS  *URINARY PROBLEMS  *BOWEL PROBLEMS  UNUSUAL RASH Items with * indicate a potential emergency and should be followed up as soon as possible.  Feel free to call the clinic should you have any questions or concerns. The clinic phone number is [phone].  Please show the CHEMO ALERT CARD at check-in to the Emergency Department and triage nurse.   

## 2020-05-28 ENCOUNTER — Other Ambulatory Visit: Payer: Self-pay

## 2020-05-28 ENCOUNTER — Inpatient Hospital Stay: Payer: Medicare (Managed Care) | Attending: Hematology and Oncology

## 2020-05-28 ENCOUNTER — Other Ambulatory Visit: Payer: Medicare (Managed Care)

## 2020-05-28 ENCOUNTER — Inpatient Hospital Stay: Payer: Medicare (Managed Care)

## 2020-05-28 VITALS — BP 125/74 | HR 95 | Temp 98.6°F | Resp 16

## 2020-05-28 DIAGNOSIS — Z7984 Long term (current) use of oral hypoglycemic drugs: Secondary | ICD-10-CM | POA: Diagnosis not present

## 2020-05-28 DIAGNOSIS — E119 Type 2 diabetes mellitus without complications: Secondary | ICD-10-CM | POA: Insufficient documentation

## 2020-05-28 DIAGNOSIS — Z79899 Other long term (current) drug therapy: Secondary | ICD-10-CM | POA: Diagnosis not present

## 2020-05-28 DIAGNOSIS — Z7189 Other specified counseling: Secondary | ICD-10-CM

## 2020-05-28 DIAGNOSIS — Z86718 Personal history of other venous thrombosis and embolism: Secondary | ICD-10-CM | POA: Insufficient documentation

## 2020-05-28 DIAGNOSIS — Z5112 Encounter for antineoplastic immunotherapy: Secondary | ICD-10-CM | POA: Insufficient documentation

## 2020-05-28 DIAGNOSIS — C9002 Multiple myeloma in relapse: Secondary | ICD-10-CM

## 2020-05-28 DIAGNOSIS — Z7901 Long term (current) use of anticoagulants: Secondary | ICD-10-CM | POA: Insufficient documentation

## 2020-05-28 DIAGNOSIS — D696 Thrombocytopenia, unspecified: Secondary | ICD-10-CM | POA: Insufficient documentation

## 2020-05-28 DIAGNOSIS — Z8041 Family history of malignant neoplasm of ovary: Secondary | ICD-10-CM | POA: Insufficient documentation

## 2020-05-28 DIAGNOSIS — Z833 Family history of diabetes mellitus: Secondary | ICD-10-CM | POA: Insufficient documentation

## 2020-05-28 DIAGNOSIS — G8220 Paraplegia, unspecified: Secondary | ICD-10-CM | POA: Diagnosis not present

## 2020-05-28 DIAGNOSIS — E2740 Unspecified adrenocortical insufficiency: Secondary | ICD-10-CM | POA: Diagnosis not present

## 2020-05-28 LAB — CMP (CANCER CENTER ONLY)
ALT: 23 U/L (ref 0–44)
AST: 18 U/L (ref 15–41)
Albumin: 3.6 g/dL (ref 3.5–5.0)
Alkaline Phosphatase: 82 U/L (ref 38–126)
Anion gap: 9 (ref 5–15)
BUN: 13 mg/dL (ref 8–23)
CO2: 27 mmol/L (ref 22–32)
Calcium: 9.3 mg/dL (ref 8.9–10.3)
Chloride: 106 mmol/L (ref 98–111)
Creatinine: 0.78 mg/dL (ref 0.61–1.24)
GFR, Estimated: >60 mL/min (ref >60)
Glucose, Bld: 104 mg/dL — ABNORMAL HIGH (ref 70–99)
Potassium: 3.8 mmol/L (ref 3.5–5.1)
Sodium: 142 mmol/L (ref 135–145)
Total Bilirubin: 0.3 mg/dL (ref 0.3–1.2)
Total Protein: 6.8 g/dL (ref 6.5–8.1)

## 2020-05-28 LAB — CBC WITH DIFFERENTIAL (CANCER CENTER ONLY)
Abs Immature Granulocytes: 0.01 10*3/uL (ref 0.00–0.07)
Basophils Absolute: 0.1 10*3/uL (ref 0.0–0.1)
Basophils Relative: 1 %
Eosinophils Absolute: 0.6 10*3/uL — ABNORMAL HIGH (ref 0.0–0.5)
Eosinophils Relative: 9 %
HCT: 36.7 % — ABNORMAL LOW (ref 39.0–52.0)
Hemoglobin: 12.7 g/dL — ABNORMAL LOW (ref 13.0–17.0)
Immature Granulocytes: 0 %
Lymphocytes Relative: 14 %
Lymphs Abs: 0.8 10*3/uL (ref 0.7–4.0)
MCH: 27.4 pg (ref 26.0–34.0)
MCHC: 34.6 g/dL (ref 30.0–36.0)
MCV: 79.1 fL — ABNORMAL LOW (ref 80.0–100.0)
Monocytes Absolute: 0.6 10*3/uL (ref 0.1–1.0)
Monocytes Relative: 10 %
Neutro Abs: 3.9 10*3/uL (ref 1.7–7.7)
Neutrophils Relative %: 66 %
Platelet Count: 257 10*3/uL (ref 150–400)
RBC: 4.64 MIL/uL (ref 4.22–5.81)
RDW: 14.9 % (ref 11.5–15.5)
WBC Count: 5.9 10*3/uL (ref 4.0–10.5)
nRBC: 0 % (ref 0.0–0.2)

## 2020-05-28 LAB — LACTATE DEHYDROGENASE: LDH: 157 U/L (ref 98–192)

## 2020-05-28 MED ORDER — DEXAMETHASONE 4 MG PO TABS
20.0000 mg | ORAL_TABLET | Freq: Once | ORAL | Status: AC
  Administered 2020-05-28: 20 mg via ORAL

## 2020-05-28 MED ORDER — BORTEZOMIB CHEMO SQ INJECTION 3.5 MG (2.5MG/ML)
1.3000 mg/m2 | Freq: Once | INTRAMUSCULAR | Status: AC
  Administered 2020-05-28: 2.75 mg via SUBCUTANEOUS
  Filled 2020-05-28: qty 1.1

## 2020-05-28 MED ORDER — DEXAMETHASONE 4 MG PO TABS
ORAL_TABLET | ORAL | Status: AC
  Filled 2020-05-28: qty 5

## 2020-05-28 NOTE — Patient Instructions (Signed)
[facility] Discharge Instructions for Patients Receiving Chemotherapy  Today you received the following chemotherapy agents Velcade.  To help prevent nausea and vomiting after your treatment, we encourage you to take your nausea medication as directed.  If you develop nausea and vomiting that is not controlled by your nausea medication, call the clinic.   BELOW ARE SYMPTOMS THAT SHOULD BE REPORTED IMMEDIATELY:  *FEVER GREATER THAN 100.5 F  *CHILLS WITH OR WITHOUT FEVER  NAUSEA AND VOMITING THAT IS NOT CONTROLLED WITH YOUR NAUSEA MEDICATION  *UNUSUAL SHORTNESS OF BREATH  *UNUSUAL BRUISING OR BLEEDING  TENDERNESS IN MOUTH AND THROAT WITH OR WITHOUT PRESENCE OF ULCERS  *URINARY PROBLEMS  *BOWEL PROBLEMS  UNUSUAL RASH Items with * indicate a potential emergency and should be followed up as soon as possible.  Feel free to call the clinic should you have any questions or concerns. The clinic phone number is [phone].  Please show the CHEMO ALERT CARD at check-in to the Emergency Department and triage nurse.   

## 2020-05-31 ENCOUNTER — Encounter: Payer: Self-pay | Admitting: Hematology and Oncology

## 2020-06-04 ENCOUNTER — Other Ambulatory Visit: Payer: Self-pay | Admitting: Hematology and Oncology

## 2020-06-04 ENCOUNTER — Other Ambulatory Visit: Payer: Self-pay

## 2020-06-04 ENCOUNTER — Inpatient Hospital Stay (HOSPITAL_BASED_OUTPATIENT_CLINIC_OR_DEPARTMENT_OTHER): Payer: Medicare (Managed Care) | Admitting: Hematology and Oncology

## 2020-06-04 ENCOUNTER — Inpatient Hospital Stay: Payer: Medicare (Managed Care)

## 2020-06-04 ENCOUNTER — Other Ambulatory Visit: Payer: Medicare (Managed Care)

## 2020-06-04 VITALS — BP 115/77 | HR 89 | Temp 96.0°F | Resp 17 | Ht 68.0 in | Wt 224.9 lb

## 2020-06-04 DIAGNOSIS — M87180 Osteonecrosis due to drugs, jaw: Secondary | ICD-10-CM

## 2020-06-04 DIAGNOSIS — C9002 Multiple myeloma in relapse: Secondary | ICD-10-CM | POA: Diagnosis not present

## 2020-06-04 DIAGNOSIS — Z5111 Encounter for antineoplastic chemotherapy: Secondary | ICD-10-CM

## 2020-06-04 DIAGNOSIS — Z5112 Encounter for antineoplastic immunotherapy: Secondary | ICD-10-CM | POA: Diagnosis not present

## 2020-06-04 DIAGNOSIS — Z7901 Long term (current) use of anticoagulants: Secondary | ICD-10-CM | POA: Diagnosis not present

## 2020-06-04 DIAGNOSIS — Z7189 Other specified counseling: Secondary | ICD-10-CM

## 2020-06-04 LAB — CBC WITH DIFFERENTIAL (CANCER CENTER ONLY)
Abs Immature Granulocytes: 0.02 10*3/uL (ref 0.00–0.07)
Basophils Absolute: 0 10*3/uL (ref 0.0–0.1)
Basophils Relative: 1 %
Eosinophils Absolute: 0.5 10*3/uL (ref 0.0–0.5)
Eosinophils Relative: 8 %
HCT: 36.6 % — ABNORMAL LOW (ref 39.0–52.0)
Hemoglobin: 12.6 g/dL — ABNORMAL LOW (ref 13.0–17.0)
Immature Granulocytes: 0 %
Lymphocytes Relative: 13 %
Lymphs Abs: 0.8 10*3/uL (ref 0.7–4.0)
MCH: 27.3 pg (ref 26.0–34.0)
MCHC: 34.4 g/dL (ref 30.0–36.0)
MCV: 79.2 fL — ABNORMAL LOW (ref 80.0–100.0)
Monocytes Absolute: 0.5 10*3/uL (ref 0.1–1.0)
Monocytes Relative: 8 %
Neutro Abs: 4.2 10*3/uL (ref 1.7–7.7)
Neutrophils Relative %: 70 %
Platelet Count: 285 10*3/uL (ref 150–400)
RBC: 4.62 MIL/uL (ref 4.22–5.81)
RDW: 15 % (ref 11.5–15.5)
WBC Count: 6.1 10*3/uL (ref 4.0–10.5)
nRBC: 0 % (ref 0.0–0.2)

## 2020-06-04 LAB — CMP (CANCER CENTER ONLY)
ALT: 33 U/L (ref 0–44)
AST: 25 U/L (ref 15–41)
Albumin: 3.6 g/dL (ref 3.5–5.0)
Alkaline Phosphatase: 89 U/L (ref 38–126)
Anion gap: 7 (ref 5–15)
BUN: 13 mg/dL (ref 8–23)
CO2: 28 mmol/L (ref 22–32)
Calcium: 9.3 mg/dL (ref 8.9–10.3)
Chloride: 108 mmol/L (ref 98–111)
Creatinine: 0.78 mg/dL (ref 0.61–1.24)
GFR, Estimated: >60 mL/min (ref >60)
Glucose, Bld: 84 mg/dL (ref 70–99)
Potassium: 4 mmol/L (ref 3.5–5.1)
Sodium: 143 mmol/L (ref 135–145)
Total Bilirubin: 0.4 mg/dL (ref 0.3–1.2)
Total Protein: 6.7 g/dL (ref 6.5–8.1)

## 2020-06-04 LAB — LACTATE DEHYDROGENASE: LDH: 118 U/L (ref 98–192)

## 2020-06-04 MED ORDER — DIPHENHYDRAMINE HCL 25 MG PO CAPS
25.0000 mg | ORAL_CAPSULE | Freq: Once | ORAL | Status: AC
  Administered 2020-06-04: 25 mg via ORAL

## 2020-06-04 MED ORDER — ACETAMINOPHEN 325 MG PO TABS
650.0000 mg | ORAL_TABLET | Freq: Once | ORAL | Status: AC
  Administered 2020-06-04: 650 mg via ORAL

## 2020-06-04 MED ORDER — DEXAMETHASONE 4 MG PO TABS
ORAL_TABLET | ORAL | Status: AC
  Filled 2020-06-04: qty 5

## 2020-06-04 MED ORDER — ACETAMINOPHEN 325 MG PO TABS
ORAL_TABLET | ORAL | Status: AC
  Filled 2020-06-04: qty 2

## 2020-06-04 MED ORDER — DIPHENHYDRAMINE HCL 25 MG PO CAPS
ORAL_CAPSULE | ORAL | Status: AC
  Filled 2020-06-04: qty 1

## 2020-06-04 MED ORDER — DARATUMUMAB-HYALURONIDASE-FIHJ 1800-30000 MG-UT/15ML ~~LOC~~ SOLN
1800.0000 mg | Freq: Once | SUBCUTANEOUS | Status: AC
  Administered 2020-06-04: 1800 mg via SUBCUTANEOUS
  Filled 2020-06-04: qty 15

## 2020-06-04 MED ORDER — DEXAMETHASONE 4 MG PO TABS
20.0000 mg | ORAL_TABLET | Freq: Once | ORAL | Status: AC
  Administered 2020-06-04: 20 mg via ORAL

## 2020-06-04 MED ORDER — BORTEZOMIB CHEMO SQ INJECTION 3.5 MG (2.5MG/ML)
1.3000 mg/m2 | Freq: Once | INTRAMUSCULAR | Status: AC
  Administered 2020-06-04: 2.75 mg via SUBCUTANEOUS
  Filled 2020-06-04: qty 1.1

## 2020-06-04 NOTE — Patient Instructions (Addendum)
[facility] Discharge Instructions for Patients Receiving Chemotherapy  Today you received the following chemotherapy agents: Bortezomib (Velcade) and Darzalex FASPRO  To help prevent nausea and vomiting after your treatment, we encourage you to take your nausea medication as directed by your MD.   If you develop nausea and vomiting that is not controlled by your nausea medication, call the clinic.   BELOW ARE SYMPTOMS THAT SHOULD BE REPORTED IMMEDIATELY:  *FEVER GREATER THAN 100.5 F  *CHILLS WITH OR WITHOUT FEVER  NAUSEA AND VOMITING THAT IS NOT CONTROLLED WITH YOUR NAUSEA MEDICATION  *UNUSUAL SHORTNESS OF BREATH  *UNUSUAL BRUISING OR BLEEDING  TENDERNESS IN MOUTH AND THROAT WITH OR WITHOUT PRESENCE OF ULCERS  *URINARY PROBLEMS  *BOWEL PROBLEMS  UNUSUAL RASH Items with * indicate a potential emergency and should be followed up as soon as possible.  Feel free to call the clinic should you have any questions or concerns. The clinic phone number is [phone].  Please show the [place] at check-in to the Emergency Department and triage nurse.

## 2020-06-04 NOTE — Progress Notes (Signed)
Pt. states he declines to stay post observation.

## 2020-06-05 ENCOUNTER — Encounter: Payer: Self-pay | Admitting: Hematology and Oncology

## 2020-06-05 NOTE — Progress Notes (Addendum)
[place] [phone]) [phone]   Fax:([phone]) [phone]  PROGRESS NOTE  Patient Care Team: [name], MD as PCP - General (Family Medicine) [name], MD as Consulting Physician (Physical Medicine and Rehabilitation) Donia Guiles Lavon Paganini, PA-C as Physician Assistant (Physical Medicine and Rehabilitation) [name], MD as Consulting Physician (Hematology and Oncology)  Hematological/Oncological History # IgG Lambda Multiple Myeloma, Relapsed (ISS Stage II) 1) 06/2010: initial diagnosis of Multiple Myeloma after T8 compression fracture. Treated with Velcade/Revlimid/Dexamethasone and achieved a complete remission 2) Velcade was discontinued in September 2012 and that Revlimid and Decadron were discontinued in March 2013. 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 4) Followed by Dr. Alvy Bimler, last clinic visit 10/09/2019. At that time there was concern for relapse of his multiple myeloma.  5) Patient requested transfer to different provider after misunderstanding regarding imaging studies 6) 12/17/2019: transfer care to Dr. Lorenso Courier  7) 01/09/2020: Cycle 1 Day 1 of Dara/Velcade/Dex 8) 01/21/2020: presented as urgent visit for diarrhea and dehydration. Holding chemotherapy scheduled for 01/23/2020. 9) 01/30/2020: Resume dara/velcade/dex after resolution of diarrhea.  10) 02/13/2020: restaging labs show M protein 0.8, Kappa 4.5, lamba 17.2, ratio 0.26, urine M protein 53 (7.1%). All MM labs indicate improvement.  11) 03/10/2020: Cycle 4 Day 1 of Dara/Velcade/Dex. Transition to q 3 week daratumumab.  12) 06/04/2020:  Cycle 8 Day 1 of Dara/Velcade/Dex  Interval History:  [name] 64 y.o. male with medical history significant for  IgG Lambda Multiple Myeloma who presents for a follow up visit. The patient's last visit was on 05/20/2020. In the interim he has had no changes  in his health status.   On exam today [name] foresees been well in the interim since his last visit.  He notes that he went to his niece's sweet 14 birthday party and enjoyed that immensely.  He reports that his treatment has been going okay and has not been having any difficulty with numbness or tingling in his fingers and no increased ostomy output or change in his urinary output.  He reports his appetite is been quite good.  He denies having any issues with fevers, chills, sweats, nausea, vomiting or diarrhea.  A full 10 point ROS is listed below.  MEDICAL HISTORY:  Past Medical History:  Diagnosis Date  . Adrenal insufficiency (HCC)    on chronic dexamethasone  . Anemia   . Cancer ([place])   . Coagulopathy ([place])    on xeralto/ s/p DVT while on coumadin,  IVC in place  . Diabetes mellitus without complication ([place])    type 2  . Gross hematuria 7/14   post foley cath procedure  . History of blood transfusion 7/14  . Multiple myeloma    thoracic T8 with paraplegia s/p resection- on chemo at visit 10/13/10  . Multiple myeloma   . Multiple myeloma without mention of remission   . Neurogenic bladder   . Neurogenic bowel   . Paraplegia ([place])   . Partial small bowel obstruction ([place]) during dec 2011 admission    SURGICAL HISTORY: Past Surgical History:  Procedure Laterality Date  . COLONOSCOPY WITH PROPOFOL N/A 04/12/2017   Procedure: COLONOSCOPY WITH PROPOFOL;  Surgeon: Irene Shipper, MD;  Location: [facility];  Service: Endoscopy;  Laterality: N/A;  . COLONOSCOPY WITH PROPOFOL N/A 04/19/2017   Procedure: COLONOSCOPY WITH PROPOFOL;  Surgeon: Yetta Flock, MD;  Location: [facility];  Service:  Gastroenterology;  Laterality: N/A;  . COLOSTOMY  07/20/2011   Procedure: COLOSTOMY;  Surgeon: Judieth Keens, DO;  Location: [facility];  Service: General;;  . COLOSTOMY REVISION  07/20/2011   Procedure: COLON RESECTION SIGMOID;  Surgeon: Judieth Keens, DO;  Location: [place];   Service: General;;  . CYSTOSCOPY N/A 04/04/2013   Procedure: CYSTOSCOPY WITH LITHALOPAXY;  Surgeon: Alexis Frock, MD;  Location: [facility];  Service: Urology;  Laterality: N/A;  . INSERTION OF SUPRAPUBIC CATHETER N/A 04/04/2013   Procedure: INSERTION OF SUPRAPUBIC CATHETER;  Surgeon: Alexis Frock, MD;  Location: [facility];  Service: Urology;  Laterality: N/A;  . LAPAROTOMY  07/20/2011   Procedure: EXPLORATORY LAPAROTOMY;  Surgeon: Judieth Keens, DO;  Location: [place];  Service: General;  Laterality: N/A;  . myeloma thoracic T8 with parpaplegia s/p thoracotomy and thoracic T7-9 cage placement on Dec 26th 2011  07/18/10    SOCIAL HISTORY: Social History   Socioeconomic History  . Marital status: Married    Spouse name: Not on file  . Number of children: Not on file  . Years of education: Not on file  . Highest education level: Not on file  Occupational History  . Not on file  Tobacco Use  . Smoking status: Never Smoker  . Smokeless tobacco: Never Used  Vaping Use  . Vaping Use: Never used  Substance and Sexual Activity  . Alcohol use: No  . Drug use: No  . Sexual activity: Never  Other Topics Concern  . Not on file  Social History Narrative  . Not on file   Social Determinants of Health   Financial Resource Strain:   . Difficulty of Paying Living Expenses: Not on file  Food Insecurity:   . Worried About Charity fundraiser in the Last Year: Not on file  . Ran Out of Food in the Last Year: Not on file  Transportation Needs:   . Lack of Transportation (Medical): Not on file  . Lack of Transportation (Non-Medical): Not on file  Physical Activity:   . Days of Exercise per Week: Not on file  . Minutes of Exercise per Session: Not on file  Stress:   . Feeling of Stress : Not on file  Social Connections:   . Frequency of Communication with Friends and Family: Not on file  . Frequency of Social Gatherings with Friends and Family: Not on file  . Attends Religious Services:  Not on file  . Active Member of Clubs or Organizations: Not on file  . Attends Archivist Meetings: Not on file  . Marital Status: Not on file  Intimate Partner Violence:   . Fear of Current or Ex-Partner: Not on file  . Emotionally Abused: Not on file  . Physically Abused: Not on file  . Sexually Abused: Not on file    FAMILY HISTORY: Family History  Problem Relation Age of Onset  . Ovarian cancer Mother   . Diabetes Father     ALLERGIES:  is allergic to feraheme [ferumoxytol].  MEDICATIONS:  Current Outpatient Medications  Medication Sig Dispense Refill  . acyclovir (ZOVIRAX) 400 MG tablet Take 1 tablet (400 mg total) by mouth 2 (two) times daily. 60 tablet 2  . atorvastatin (LIPITOR) 10 MG tablet Take 10 mg by mouth daily.    . baclofen (LIORESAL) 20 MG tablet Take 20 mg by mouth 2 (two) times daily.    . diazepam (DIASTAT ACUDIAL) 10 MG GEL SMARTSIG:By Mouth    .  dorzolamide (TRUSOPT) 2 % ophthalmic solution 1 drop 2 (two) times daily.    . iron polysaccharides (NU-IRON) 150 MG capsule Take 1 capsule (150 mg total) by mouth daily. 30 capsule 0  . latanoprost (XALATAN) 0.005 % ophthalmic solution Place 1 drop into both eyes at bedtime.    . metFORMIN (GLUCOPHAGE) 500 MG tablet Take 500 mg by mouth 2 (two) times daily with a meal.    . ondansetron (ZOFRAN) 8 MG tablet Take 1 tablet (8 mg total) by mouth every 8 (eight) hours as needed for nausea or vomiting. 30 tablet 0  . rivaroxaban (XARELTO) 10 MG TABS tablet Take 1 tablet (10 mg total) by mouth daily with supper. 30 tablet 9  . Skin Protectants, Misc. (EUCERIN) cream Apply 1 application topically 2 (two) times daily as needed for dry skin.     Marland Kitchen zinc oxide (BALMEX) 11.3 % CREA cream Apply 1 application topically 2 (two) times daily.     No current facility-administered medications for this visit.    REVIEW OF SYSTEMS:   Constitutional: ( - ) fevers, ( - )  chills , ( - ) night sweats Eyes: ( - ) blurriness of  vision, ( - ) double vision, ( - ) watery eyes Ears, nose, mouth, throat, and face: ( - ) mucositis, ( - ) sore throat Respiratory: ( - ) cough, ( - ) dyspnea, ( - ) wheezes Cardiovascular: ( - ) palpitation, ( - ) chest discomfort, ( - ) lower extremity swelling Gastrointestinal:  ( - ) nausea, ( - ) heartburn, ( - ) change in bowel habits Skin: ( - ) abnormal skin rashes Lymphatics: ( - ) new lymphadenopathy, ( - ) easy bruising Neurological: ( - ) numbness, ( - ) tingling, ( - ) new weaknesses Behavioral/Psych: ( - ) mood change, ( - ) new changes  All other systems were reviewed with the patient and are negative.  PHYSICAL EXAMINATION: ECOG PERFORMANCE STATUS: paraplegic.   Vitals:   06/04/20 1114  BP: 115/77  Pulse: 89  Resp: 17  Temp: (!) 96 F (35.6 C)  SpO2: 99%   Filed Weights   06/04/20 1114  Weight: 224 lb 14.4 oz (102 kg)    GENERAL: well appearing middle aged [place] American male alert, no distress and comfortable SKIN: skin color, texture, turgor are normal, no rashes or significant lesions EYES: conjunctiva are pink and non-injected, sclera clear LUNGS: clear to auscultation and percussion with normal breathing effort HEART: regular rate & rhythm and no murmurs and no lower extremity edema Musculoskeletal: no cyanosis of digits and no clubbing  PSYCH: alert & oriented x 3, fluent speech NEURO: paraplegic, no use of LE bilaterally.   LABORATORY DATA:  I have reviewed the data as listed CBC Latest Ref Rng & Units 06/04/2020 05/28/2020 05/20/2020  WBC 4.0 - 10.5 K/uL 6.1 5.9 8.0  Hemoglobin 13.0 - 17.0 g/dL 12.6(L) 12.7(L) 13.1  Hematocrit 39 - 52 % 36.6(L) 36.7(L) 38.0(L)  Platelets 150 - 400 K/uL 285 257 289    CMP Latest Ref Rng & Units 06/04/2020 05/28/2020 05/20/2020  Glucose 70 - 99 mg/dL 84 104(H) 95  BUN 8 - 23 mg/dL 13 13 16   Creatinine 0.61 - 1.24 mg/dL 0.78 0.78 0.78  Sodium 135 - 145 mmol/L 143 142 140  Potassium 3.5 - 5.1 mmol/L 4.0 3.8 4.0    Chloride 98 - 111 mmol/L 108 106 103  CO2 22 - 32 mmol/L 28 27 28   Calcium  8.9 - 10.3 mg/dL 9.3 9.3 9.8  Total Protein 6.5 - 8.1 g/dL 6.7 6.8 7.1  Total Bilirubin 0.3 - 1.2 mg/dL 0.4 0.3 0.4  Alkaline Phos 38 - 126 U/L 89 82 81  AST 15 - 41 U/L 25 18 15   ALT 0 - 44 U/L 33 23 24    Lab Results  Component Value Date   MPROTEIN 0.2 (H) 04/30/2020   MPROTEIN 0.3 (H) 03/17/2020   MPROTEIN 0.8 (H) 02/13/2020   Lab Results  Component Value Date   KPAFRELGTCHN 6.0 04/30/2020   KPAFRELGTCHN 4.8 03/17/2020   KPAFRELGTCHN 4.5 02/13/2020   LAMBDASER 5.5 (L) 04/30/2020   LAMBDASER 11.8 03/17/2020   LAMBDASER 17.2 02/13/2020   KAPLAMBRATIO >3.69 05/07/2020   KAPLAMBRATIO 1.09 04/30/2020   KAPLAMBRATIO 0.41 03/17/2020    RADIOGRAPHIC STUDIES: No results found.  ASSESSMENT & PLAN [name] 64 y.o. male with medical history significant for  IgG Lambda Multiple Myeloma who presents for a follow up visit.  After review of the labs, discussion with the patient, and reviewed the imaging his findings are most consistent with a relapsed multiple myeloma.The patient has had excellent success before with treatment of his myeloma with Velcade, Revlimid, and dexamethasone.  On exam today [name] notes he has been doing well.  He is not having any new symptoms or side effect from the chemotherapy.  His appetite has been good and he has no questions concerns or complaints today.  He is agreeable to continuing forward treatment this time.   Treatment regimen consists of bortezomib 1.22m/m2 subq (Day 1,8, and 15), daratumumab 1800 mg subq (day 1,8 and 15), and dexamethasone 20 mg PO (day 1, 8 and 15). After 3 cycles, daratumumab to be administered q 3 weeks (from cycles 4-8). After cycle 8, continue daratumumab q 4 weeks alone (N Engl J Med 2016; [id number]  # IgG Lambda Multiple Myeloma, Relapsed (ISS Stage II) --findings are most consistent with relapsed multiple myeloma. Patient previously  successfully treated with Velcade/Rev/Dex --current plan is Daratumumab/Velcade/Dex  (started 01/09/2020). Continue weekly velcade with q 3 week dara. On 07/02/2020 will transition to monthly daratumumab --recollect restaging MM labs monthly to include MM panel, SFLC. Additionally will collect LDH and beta 2 microglobulin.  --will have patient evaluated by dentistry prior to restarting Zometa therapy. He had an episode of osteonecrosis in the past, risk of restarting may outweight benefit.  --f/u in 2 weeks on 06/18/2020  #History of DVT --He had placement of IVC filter, remains on Xarelto. --Due to poor mobility, and lack of bleeding complications, I recommend he remain on Xarelto indefinitely. --caution if Plt count were to drop <50  # Supportive Care -- provided patient with an albuterol inhaler (for use with daratumumab) --acyclovir 4022mBID for VZV prophylaxis --zofran 3m14m8H PRN for nausea/vomiting  --Zometa can be considered after dental clearance, though with his prior episode of osteonecrosis the risk may outweigh the benefits. --patient recieved pretreatment RBC phenotype  No orders of the defined types were placed in this encounter.  All questions were answered. The patient knows to call the clinic with any problems, questions or concerns.  A total of more than 30 minutes were spent on this encounter and over half of that time was spent on counseling and coordination of care as outlined above.   JohLedell PeoplesD Department of Hematology/Oncology [facility] Ltdone: [phone]: [phone]: [email or web address]  06/05/2020 2:42 PM   Literature  Support:  Palumbo A, Chanan-Khan A, Weisel K, Nooka AK, Masszi T, Nicholson, Lohman I, Troy V, Munder M, [place], [place], Qi M, Schecter J, [place], Qin X, Deraedt W, Ahmadi T, Spencer A, Sonneveld P; [employer] Investigators. Daratumumab, Bortezomib, and Dexamethasone for  Multiple Myeloma. [place] Med. 2016 Aug 25;375(8):754-66  --among patients with relapsed or relapsed and refractory multiple myeloma, daratumumab in combination with bortezomib and dexamethasone resulted in significantly longer progression-free survival than bortezomib and dexamethasone alone and was associated with infusion-related reactions and higher rates of thrombocytopenia and neutropenia than bortezomib and dexamethasone alone.

## 2020-06-07 LAB — KAPPA/LAMBDA LIGHT CHAINS
Kappa free light chain: 6.1 mg/L (ref 3.3–19.4)
Kappa, lambda light chain ratio: 0.9 (ref 0.26–1.65)
Lambda free light chains: 6.8 mg/L (ref 5.7–26.3)

## 2020-06-08 LAB — MULTIPLE MYELOMA PANEL, SERUM
Albumin SerPl Elph-Mcnc: 3.4 g/dL (ref 2.9–4.4)
Albumin/Glob SerPl: 1.4 (ref 0.7–1.7)
Alpha 1: 0.2 g/dL (ref 0.0–0.4)
Alpha2 Glob SerPl Elph-Mcnc: 0.9 g/dL (ref 0.4–1.0)
B-Globulin SerPl Elph-Mcnc: 0.9 g/dL (ref 0.7–1.3)
Gamma Glob SerPl Elph-Mcnc: 0.5 g/dL (ref 0.4–1.8)
Globulin, Total: 2.5 g/dL (ref 2.2–3.9)
IgA: 24 mg/dL — ABNORMAL LOW (ref 61–437)
IgG (Immunoglobin G), Serum: 562 mg/dL — ABNORMAL LOW (ref 603–1613)
IgM (Immunoglobulin M), Srm: 18 mg/dL — ABNORMAL LOW (ref 20–172)
M Protein SerPl Elph-Mcnc: 0.2 g/dL — ABNORMAL HIGH
Total Protein ELP: 5.9 g/dL — ABNORMAL LOW (ref 6.0–8.5)

## 2020-06-11 ENCOUNTER — Inpatient Hospital Stay: Payer: Medicare (Managed Care)

## 2020-06-11 ENCOUNTER — Other Ambulatory Visit: Payer: Medicare (Managed Care)

## 2020-06-11 ENCOUNTER — Other Ambulatory Visit: Payer: Self-pay

## 2020-06-11 VITALS — BP 102/64 | HR 94 | Temp 98.1°F | Resp 20

## 2020-06-11 DIAGNOSIS — Z7189 Other specified counseling: Secondary | ICD-10-CM

## 2020-06-11 DIAGNOSIS — C9002 Multiple myeloma in relapse: Secondary | ICD-10-CM

## 2020-06-11 DIAGNOSIS — Z5112 Encounter for antineoplastic immunotherapy: Secondary | ICD-10-CM | POA: Diagnosis not present

## 2020-06-11 LAB — CMP (CANCER CENTER ONLY)
ALT: 17 U/L (ref 0–44)
AST: 12 U/L — ABNORMAL LOW (ref 15–41)
Albumin: 3.5 g/dL (ref 3.5–5.0)
Alkaline Phosphatase: 73 U/L (ref 38–126)
Anion gap: 9 (ref 5–15)
BUN: 11 mg/dL (ref 8–23)
CO2: 27 mmol/L (ref 22–32)
Calcium: 9.1 mg/dL (ref 8.9–10.3)
Chloride: 108 mmol/L (ref 98–111)
Creatinine: 0.76 mg/dL (ref 0.61–1.24)
GFR, Estimated: >60 mL/min (ref >60)
Glucose, Bld: 94 mg/dL (ref 70–99)
Potassium: 3.6 mmol/L (ref 3.5–5.1)
Sodium: 144 mmol/L (ref 135–145)
Total Bilirubin: 0.5 mg/dL (ref 0.3–1.2)
Total Protein: 6.6 g/dL (ref 6.5–8.1)

## 2020-06-11 LAB — CBC WITH DIFFERENTIAL (CANCER CENTER ONLY)
Abs Immature Granulocytes: 0.01 10*3/uL (ref 0.00–0.07)
Basophils Absolute: 0 10*3/uL (ref 0.0–0.1)
Basophils Relative: 1 %
Eosinophils Absolute: 0.5 10*3/uL (ref 0.0–0.5)
Eosinophils Relative: 7 %
HCT: 36 % — ABNORMAL LOW (ref 39.0–52.0)
Hemoglobin: 12.3 g/dL — ABNORMAL LOW (ref 13.0–17.0)
Immature Granulocytes: 0 %
Lymphocytes Relative: 13 %
Lymphs Abs: 0.8 10*3/uL (ref 0.7–4.0)
MCH: 27.2 pg (ref 26.0–34.0)
MCHC: 34.2 g/dL (ref 30.0–36.0)
MCV: 79.6 fL — ABNORMAL LOW (ref 80.0–100.0)
Monocytes Absolute: 0.6 10*3/uL (ref 0.1–1.0)
Monocytes Relative: 9 %
Neutro Abs: 4.5 10*3/uL (ref 1.7–7.7)
Neutrophils Relative %: 70 %
Platelet Count: 273 10*3/uL (ref 150–400)
RBC: 4.52 MIL/uL (ref 4.22–5.81)
RDW: 15.2 % (ref 11.5–15.5)
WBC Count: 6.4 10*3/uL (ref 4.0–10.5)
nRBC: 0 % (ref 0.0–0.2)

## 2020-06-11 LAB — LACTATE DEHYDROGENASE: LDH: 134 U/L (ref 98–192)

## 2020-06-11 MED ORDER — DEXAMETHASONE 4 MG PO TABS
ORAL_TABLET | ORAL | Status: AC
  Filled 2020-06-11: qty 5

## 2020-06-11 MED ORDER — BORTEZOMIB CHEMO SQ INJECTION 3.5 MG (2.5MG/ML)
1.3000 mg/m2 | Freq: Once | INTRAMUSCULAR | Status: AC
  Administered 2020-06-11: 2.75 mg via SUBCUTANEOUS
  Filled 2020-06-11: qty 1.1

## 2020-06-11 MED ORDER — DEXAMETHASONE 4 MG PO TABS
20.0000 mg | ORAL_TABLET | Freq: Once | ORAL | Status: AC
  Administered 2020-06-11: 20 mg via ORAL

## 2020-06-11 NOTE — Patient Instructions (Signed)
[facility] Discharge Instructions for Patients Receiving Chemotherapy  Today you received the following chemotherapy agent: Bortezomib (Velcade)  To help prevent nausea and vomiting after your treatment, we encourage you to take your nausea medication as directed by your MD.   If you develop nausea and vomiting that is not controlled by your nausea medication, call the clinic.   BELOW ARE SYMPTOMS THAT SHOULD BE REPORTED IMMEDIATELY:  *FEVER GREATER THAN 100.5 F  *CHILLS WITH OR WITHOUT FEVER  NAUSEA AND VOMITING THAT IS NOT CONTROLLED WITH YOUR NAUSEA MEDICATION  *UNUSUAL SHORTNESS OF BREATH  *UNUSUAL BRUISING OR BLEEDING  TENDERNESS IN MOUTH AND THROAT WITH OR WITHOUT PRESENCE OF ULCERS  *URINARY PROBLEMS  *BOWEL PROBLEMS  UNUSUAL RASH Items with * indicate a potential emergency and should be followed up as soon as possible.  Feel free to call the clinic should you have any questions or concerns. The clinic phone number is [phone].  Please show the CHEMO ALERT CARD at check-in to the Emergency Department and triage nurse.   

## 2020-06-16 ENCOUNTER — Other Ambulatory Visit: Payer: Medicare (Managed Care)

## 2020-06-16 ENCOUNTER — Ambulatory Visit: Payer: Medicare (Managed Care) | Admitting: Hematology and Oncology

## 2020-06-18 ENCOUNTER — Inpatient Hospital Stay: Payer: Medicare (Managed Care)

## 2020-06-18 ENCOUNTER — Encounter: Payer: Self-pay | Admitting: Hematology and Oncology

## 2020-06-18 ENCOUNTER — Inpatient Hospital Stay (HOSPITAL_BASED_OUTPATIENT_CLINIC_OR_DEPARTMENT_OTHER): Payer: Medicare (Managed Care) | Admitting: Hematology and Oncology

## 2020-06-18 ENCOUNTER — Ambulatory Visit: Payer: Medicare (Managed Care)

## 2020-06-18 ENCOUNTER — Other Ambulatory Visit: Payer: Self-pay

## 2020-06-18 VITALS — BP 105/72 | HR 99 | Temp 97.0°F | Resp 17 | Ht 68.0 in | Wt 229.0 lb

## 2020-06-18 DIAGNOSIS — Z95828 Presence of other vascular implants and grafts: Secondary | ICD-10-CM

## 2020-06-18 DIAGNOSIS — Z7189 Other specified counseling: Secondary | ICD-10-CM

## 2020-06-18 DIAGNOSIS — C9002 Multiple myeloma in relapse: Secondary | ICD-10-CM

## 2020-06-18 DIAGNOSIS — Z5112 Encounter for antineoplastic immunotherapy: Secondary | ICD-10-CM | POA: Diagnosis not present

## 2020-06-18 DIAGNOSIS — G8220 Paraplegia, unspecified: Secondary | ICD-10-CM

## 2020-06-18 DIAGNOSIS — Z7901 Long term (current) use of anticoagulants: Secondary | ICD-10-CM | POA: Diagnosis not present

## 2020-06-18 LAB — CBC WITH DIFFERENTIAL (CANCER CENTER ONLY)
Abs Immature Granulocytes: 0.02 10*3/uL (ref 0.00–0.07)
Basophils Absolute: 0 10*3/uL (ref 0.0–0.1)
Basophils Relative: 0 %
Eosinophils Absolute: 0.4 10*3/uL (ref 0.0–0.5)
Eosinophils Relative: 6 %
HCT: 35.1 % — ABNORMAL LOW (ref 39.0–52.0)
Hemoglobin: 12.2 g/dL — ABNORMAL LOW (ref 13.0–17.0)
Immature Granulocytes: 0 %
Lymphocytes Relative: 14 %
Lymphs Abs: 1 10*3/uL (ref 0.7–4.0)
MCH: 27.4 pg (ref 26.0–34.0)
MCHC: 34.8 g/dL (ref 30.0–36.0)
MCV: 78.7 fL — ABNORMAL LOW (ref 80.0–100.0)
Monocytes Absolute: 0.5 10*3/uL (ref 0.1–1.0)
Monocytes Relative: 7 %
Neutro Abs: 5.3 10*3/uL (ref 1.7–7.7)
Neutrophils Relative %: 73 %
Platelet Count: 265 10*3/uL (ref 150–400)
RBC: 4.46 MIL/uL (ref 4.22–5.81)
RDW: 15.4 % (ref 11.5–15.5)
WBC Count: 7.2 10*3/uL (ref 4.0–10.5)
nRBC: 0 % (ref 0.0–0.2)

## 2020-06-18 LAB — CMP (CANCER CENTER ONLY)
ALT: 16 U/L (ref 0–44)
AST: 12 U/L — ABNORMAL LOW (ref 15–41)
Albumin: 3.5 g/dL (ref 3.5–5.0)
Alkaline Phosphatase: 77 U/L (ref 38–126)
Anion gap: 10 (ref 5–15)
BUN: 17 mg/dL (ref 8–23)
CO2: 27 mmol/L (ref 22–32)
Calcium: 9.7 mg/dL (ref 8.9–10.3)
Chloride: 103 mmol/L (ref 98–111)
Creatinine: 0.8 mg/dL (ref 0.61–1.24)
GFR, Estimated: >60 mL/min (ref >60)
Glucose, Bld: 95 mg/dL (ref 70–99)
Potassium: 3.8 mmol/L (ref 3.5–5.1)
Sodium: 140 mmol/L (ref 135–145)
Total Bilirubin: 0.5 mg/dL (ref 0.3–1.2)
Total Protein: 6.6 g/dL (ref 6.5–8.1)

## 2020-06-18 LAB — LACTATE DEHYDROGENASE: LDH: 113 U/L (ref 98–192)

## 2020-06-18 MED ORDER — BORTEZOMIB CHEMO SQ INJECTION 3.5 MG (2.5MG/ML)
1.3000 mg/m2 | Freq: Once | INTRAMUSCULAR | Status: AC
  Administered 2020-06-18: 2.75 mg via SUBCUTANEOUS
  Filled 2020-06-18: qty 1.1

## 2020-06-18 MED ORDER — DEXAMETHASONE 4 MG PO TABS
20.0000 mg | ORAL_TABLET | Freq: Once | ORAL | Status: AC
  Administered 2020-06-18: 20 mg via ORAL

## 2020-06-18 MED ORDER — DEXAMETHASONE 4 MG PO TABS
ORAL_TABLET | ORAL | Status: AC
  Filled 2020-06-18: qty 5

## 2020-06-18 NOTE — Patient Instructions (Signed)
[facility] Discharge Instructions for Patients Receiving Chemotherapy  Today you received the following chemotherapy agent: Bortezomib (Velcade)  To help prevent nausea and vomiting after your treatment, we encourage you to take your nausea medication as directed by your MD.   If you develop nausea and vomiting that is not controlled by your nausea medication, call the clinic.   BELOW ARE SYMPTOMS THAT SHOULD BE REPORTED IMMEDIATELY:  *FEVER GREATER THAN 100.5 F  *CHILLS WITH OR WITHOUT FEVER  NAUSEA AND VOMITING THAT IS NOT CONTROLLED WITH YOUR NAUSEA MEDICATION  *UNUSUAL SHORTNESS OF BREATH  *UNUSUAL BRUISING OR BLEEDING  TENDERNESS IN MOUTH AND THROAT WITH OR WITHOUT PRESENCE OF ULCERS  *URINARY PROBLEMS  *BOWEL PROBLEMS  UNUSUAL RASH Items with * indicate a potential emergency and should be followed up as soon as possible.  Feel free to call the clinic should you have any questions or concerns. The clinic phone number is [phone].  Please show the CHEMO ALERT CARD at check-in to the Emergency Department and triage nurse.   

## 2020-06-18 NOTE — Progress Notes (Signed)
[place] [phone]   [phone]  PROGRESS NOTE  Patient Care Team: [name], MD as PCP - General (Family Medicine) [name], MD as Consulting Physician (Physical Medicine and Rehabilitation) Donia Guiles Lavon Paganini, PA-C as Physician Assistant (Physical Medicine and Rehabilitation) [name], MD as Consulting Physician (Hematology and Oncology)  Hematological/Oncological History # IgG Lambda Multiple Myeloma, Relapsed (ISS Stage II) 1) 06/2010: initial diagnosis of Multiple Myeloma after T8 compression fracture. Treated with Velcade/Revlimid/Dexamethasone and achieved a complete remission 2) Velcade was discontinued in September 2012 and that Revlimid and Decadron were discontinued in March 2013. 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 4) Followed by Dr. Alvy Bimler, last clinic visit 10/09/2019. At that time there was concern for relapse of his multiple myeloma.  5) Patient requested transfer to different provider after misunderstanding regarding imaging studies 6) 12/17/2019: transfer care to Dr. Lorenso Courier  7) 01/09/2020: Cycle 1 Day 1 of Dara/Velcade/Dex 8) 01/21/2020: presented as urgent visit for diarrhea and dehydration. Holding chemotherapy scheduled for 01/23/2020. 9) 01/30/2020: Resume dara/velcade/dex after resolution of diarrhea.  10) 02/13/2020: restaging labs show M protein 0.8, Kappa 4.5, lamba 17.2, ratio 0.26, urine M protein 53 (7.1%). All MM labs indicate improvement.  11) 03/10/2020: Cycle 4 Day 1 of Dara/Velcade/Dex. Transition to q 3 week daratumumab.  12) 06/04/2020:  Cycle 8 Day 1 of Dara/Velcade/Dex  Interval History:  Lubertha Sayres 64 y.o. male with medical history significant for  IgG Lambda Multiple Myeloma who presents for a follow up visit. The patient's last visit was on 06/04/2020. In the interim he has had no changes  in his health status.   On exam today Mr. Woodrum has been well in the interim since our last visit.  He has had no issues with fevers, chills, sweats, nausea, vomiting or diarrhea.  He reports that "everything is good".  His appetite has been good and his bowels have not been moving too frequently into his ostomy bag.  He is also notes no increased urinary output or increased with urination.  Overall he feels like he is stable and tolerating therapy well.  He has no additional questions comments or concerns today.  A full 10 point ROS is listed below. MEDICAL HISTORY:  Past Medical History:  Diagnosis Date  . Adrenal insufficiency (HCC)    on chronic dexamethasone  . Anemia   . Cancer ([place])   . Coagulopathy ([place])    on xeralto/ s/p DVT while on coumadin,  IVC in place  . Diabetes mellitus without complication ([place])    type 2  . Gross hematuria 7/14   post foley cath procedure  . History of blood transfusion 7/14  . Multiple myeloma    thoracic T8 with paraplegia s/p resection- on chemo at visit 10/13/10  . Multiple myeloma   . Multiple myeloma without mention of remission   . Neurogenic bladder   . Neurogenic bowel   . Paraplegia ([place])   . Partial small bowel obstruction ([place]) during dec 2011 admission    SURGICAL HISTORY: Past Surgical History:  Procedure Laterality Date  . COLONOSCOPY WITH PROPOFOL N/A 04/12/2017   Procedure: COLONOSCOPY WITH PROPOFOL;  Surgeon: Irene Shipper, MD;  Location: [facility];  Service: Endoscopy;  Laterality: N/A;  . COLONOSCOPY WITH PROPOFOL N/A 04/19/2017   Procedure: COLONOSCOPY WITH PROPOFOL;  Surgeon: Yetta Flock, MD;  Location: [facility];  Service: Gastroenterology;  Laterality: N/A;  . COLOSTOMY  07/20/2011   Procedure: COLOSTOMY;  Surgeon: Judieth Keens, DO;  Location: [facility] OR;  Service: General;;  . COLOSTOMY REVISION  07/20/2011   Procedure: COLON RESECTION SIGMOID;  Surgeon: Judieth Keens, DO;  Location: [facility];  Service:  General;;  . CYSTOSCOPY N/A 04/04/2013   Procedure: CYSTOSCOPY WITH LITHALOPAXY;  Surgeon: Alexis Frock, MD;  Location: [facility];  Service: Urology;  Laterality: N/A;  . INSERTION OF SUPRAPUBIC CATHETER N/A 04/04/2013   Procedure: INSERTION OF SUPRAPUBIC CATHETER;  Surgeon: Alexis Frock, MD;  Location: [facility];  Service: Urology;  Laterality: N/A;  . LAPAROTOMY  07/20/2011   Procedure: EXPLORATORY LAPAROTOMY;  Surgeon: Judieth Keens, DO;  Location: [facility];  Service: General;  Laterality: N/A;  . myeloma thoracic T8 with parpaplegia s/p thoracotomy and thoracic T7-9 cage placement on Dec 26th 2011  07/18/10    SOCIAL HISTORY: Social History   Socioeconomic History  . Marital status: Married    Spouse name: Not on file  . Number of children: Not on file  . Years of education: Not on file  . Highest education level: Not on file  Occupational History  . Not on file  Tobacco Use  . Smoking status: Never Smoker  . Smokeless tobacco: Never Used  Vaping Use  . Vaping Use: Never used  Substance and Sexual Activity  . Alcohol use: No  . Drug use: No  . Sexual activity: Never  Other Topics Concern  . Not on file  Social History Narrative  . Not on file   Social Determinants of Health   Financial Resource Strain:   . Difficulty of Paying Living Expenses: Not on file  Food Insecurity:   . Worried About Charity fundraiser in the Last Year: Not on file  . Ran Out of Food in the Last Year: Not on file  Transportation Needs:   . Lack of Transportation (Medical): Not on file  . Lack of Transportation (Non-Medical): Not on file  Physical Activity:   . Days of Exercise per Week: Not on file  . Minutes of Exercise per Session: Not on file  Stress:   . Feeling of Stress : Not on file  Social Connections:   . Frequency of Communication with Friends and Family: Not on file  . Frequency of Social Gatherings with Friends and Family: Not on file  . Attends Religious Services: Not on  file  . Active Member of Clubs or Organizations: Not on file  . Attends Archivist Meetings: Not on file  . Marital Status: Not on file  Intimate Partner Violence:   . Fear of Current or Ex-Partner: Not on file  . Emotionally Abused: Not on file  . Physically Abused: Not on file  . Sexually Abused: Not on file    FAMILY HISTORY: Family History  Problem Relation Age of Onset  . Ovarian cancer Mother   . Diabetes Father     ALLERGIES:  is allergic to feraheme [ferumoxytol].  MEDICATIONS:  Current Outpatient Medications  Medication Sig Dispense Refill  . acyclovir (ZOVIRAX) 400 MG tablet Take 1 tablet (400 mg total) by mouth 2 (two) times daily. 60 tablet 2  . atorvastatin (LIPITOR) 10 MG tablet Take 10 mg by mouth daily.    . baclofen (LIORESAL) 20 MG tablet Take 20 mg by mouth 2 (two) times daily.    . diazepam (DIASTAT ACUDIAL) 10 MG GEL SMARTSIG:By Mouth    . dorzolamide (TRUSOPT)  2 % ophthalmic solution 1 drop 2 (two) times daily.    . iron polysaccharides (NU-IRON) 150 MG capsule Take 1 capsule (150 mg total) by mouth daily. 30 capsule 0  . latanoprost (XALATAN) 0.005 % ophthalmic solution Place 1 drop into both eyes at bedtime.    . metFORMIN (GLUCOPHAGE) 500 MG tablet Take 500 mg by mouth 2 (two) times daily with a meal.    . ondansetron (ZOFRAN) 8 MG tablet Take 1 tablet (8 mg total) by mouth every 8 (eight) hours as needed for nausea or vomiting. 30 tablet 0  . rivaroxaban (XARELTO) 10 MG TABS tablet Take 1 tablet (10 mg total) by mouth daily with supper. 30 tablet 9  . Skin Protectants, Misc. (EUCERIN) cream Apply 1 application topically 2 (two) times daily as needed for dry skin.     Marland Kitchen zinc oxide (BALMEX) 11.3 % CREA cream Apply 1 application topically 2 (two) times daily.     No current facility-administered medications for this visit.    REVIEW OF SYSTEMS:   Constitutional: ( - ) fevers, ( - )  chills , ( - ) night sweats Eyes: ( - ) blurriness of vision,  ( - ) double vision, ( - ) watery eyes Ears, nose, mouth, throat, and face: ( - ) mucositis, ( - ) sore throat Respiratory: ( - ) cough, ( - ) dyspnea, ( - ) wheezes Cardiovascular: ( - ) palpitation, ( - ) chest discomfort, ( - ) lower extremity swelling Gastrointestinal:  ( - ) nausea, ( - ) heartburn, ( - ) change in bowel habits Skin: ( - ) abnormal skin rashes Lymphatics: ( - ) new lymphadenopathy, ( - ) easy bruising Neurological: ( - ) numbness, ( - ) tingling, ( - ) new weaknesses Behavioral/Psych: ( - ) mood change, ( - ) new changes  All other systems were reviewed with the patient and are negative.  PHYSICAL EXAMINATION: ECOG PERFORMANCE STATUS: paraplegic.   Vitals:   06/18/20 1127  BP: 105/72  Pulse: 99  Resp: 17  Temp: (!) 97 F (36.1 C)  SpO2: 100%   Filed Weights   06/18/20 1127  Weight: 229 lb (103.9 kg)    GENERAL: well appearing middle aged [place] American male alert, no distress and comfortable SKIN: skin color, texture, turgor are normal, no rashes or significant lesions EYES: conjunctiva are pink and non-injected, sclera clear LUNGS: clear to auscultation and percussion with normal breathing effort HEART: regular rate & rhythm and no murmurs and no lower extremity edema Musculoskeletal: no cyanosis of digits and no clubbing  PSYCH: alert & oriented x 3, fluent speech NEURO: paraplegic, no use of LE bilaterally.   LABORATORY DATA:  I have reviewed the data as listed CBC Latest Ref Rng & Units 06/18/2020 06/11/2020 06/04/2020  WBC 4.0 - 10.5 K/uL 7.2 6.4 6.1  Hemoglobin 13.0 - 17.0 g/dL 12.2(L) 12.3(L) 12.6(L)  Hematocrit 39 - 52 % 35.1(L) 36.0(L) 36.6(L)  Platelets 150 - 400 K/uL 265 273 285    CMP Latest Ref Rng & Units 06/18/2020 06/11/2020 06/04/2020  Glucose 70 - 99 mg/dL 95 94 84  BUN 8 - 23 mg/dL 17 11 13   Creatinine 0.61 - 1.24 mg/dL 0.80 0.76 0.78  Sodium 135 - 145 mmol/L 140 144 143  Potassium 3.5 - 5.1 mmol/L 3.8 3.6 4.0  Chloride 98  - 111 mmol/L 103 108 108  CO2 22 - 32 mmol/L 27 27 28   Calcium 8.9 - 10.3 mg/dL 9.7  9.1 9.3  Total Protein 6.5 - 8.1 g/dL 6.6 6.6 6.7  Total Bilirubin 0.3 - 1.2 mg/dL 0.5 0.5 0.4  Alkaline Phos 38 - 126 U/L 77 73 89  AST 15 - 41 U/L 12(L) 12(L) 25  ALT 0 - 44 U/L 16 17 33    Lab Results  Component Value Date   MPROTEIN 0.2 (H) 06/04/2020   MPROTEIN 0.2 (H) 04/30/2020   MPROTEIN 0.3 (H) 03/17/2020   Lab Results  Component Value Date   KPAFRELGTCHN 6.1 06/04/2020   KPAFRELGTCHN 6.0 04/30/2020   KPAFRELGTCHN 4.8 03/17/2020   LAMBDASER 6.8 06/04/2020   LAMBDASER 5.5 (L) 04/30/2020   LAMBDASER 11.8 03/17/2020   KAPLAMBRATIO 0.90 06/04/2020   KAPLAMBRATIO >3.69 05/07/2020   KAPLAMBRATIO 1.09 04/30/2020    RADIOGRAPHIC STUDIES: No results found.  ASSESSMENT & PLAN ODIE EDMONDS 64 y.o. male with medical history significant for  IgG Lambda Multiple Myeloma who presents for a follow up visit.  After review of the labs, discussion with the patient, and reviewed the imaging his findings are most consistent with a relapsed multiple myeloma.The patient has had excellent success before with treatment of his myeloma with Velcade, Revlimid, and dexamethasone.  On exam today Mr. Junio stable at his baseline level of health.  He reports that everything is "good".  He has no questions concerns or complaints today.  He is willing and able to proceed with treatment at this time.  Labs are stable.   Treatment regimen consists of bortezomib 1.45m/m2 subq (Day 1,8, and 15), daratumumab 1800 mg subq (day 1,8 and 15), and dexamethasone 20 mg PO (day 1, 8 and 15). After 3 cycles, daratumumab to be administered q 3 weeks (from cycles 4-8). After cycle 8, continue daratumumab q 4 weeks alone (N Engl J Med 2016; [id number]  # IgG Lambda Multiple Myeloma, Relapsed (ISS Stage II) --findings are most consistent with relapsed multiple myeloma. Patient previously successfully treated with  Velcade/Rev/Dex --current plan is Daratumumab/Velcade/Dex  (started 01/09/2020). Continue weekly velcade with q 3 week dara. On 07/02/2020 will transition to monthly daratumumab alone --recollect restaging MM labs monthly to include MM panel, SFLC. Additionally will collect LDH and beta 2 microglobulin.  --will have patient evaluated by dentistry prior to restarting Zometa therapy. He had an episode of osteonecrosis in the past, risk of restarting may outweight benefit.  --Cycle 9 of chemotherapy to start on 06/25/2020. Will see patient at prior to start of Cycle 10 on 07/23/2020.    #History of DVT --He had placement of IVC filter, remains on Xarelto. --Due to poor mobility, and lack of bleeding complications, I recommend he remain on Xarelto indefinitely. --caution if Plt count were to drop <50  # Supportive Care -- provided patient with an albuterol inhaler (for use with daratumumab) --acyclovir 4085mBID for VZV prophylaxis --zofran 60m59m8H PRN for nausea/vomiting  --Zometa can be considered after dental clearance, though with his prior episode of osteonecrosis the risk may outweigh the benefits. --patient recieved pretreatment RBC phenotype  No orders of the defined types were placed in this encounter.  All questions were answered. The patient knows to call the clinic with any problems, questions or concerns.  A total of more than 30 minutes were spent on this encounter and over half of that time was spent on counseling and coordination of care as outlined above.   JohLedell PeoplesD Department of Hematology/Oncology [facility] Hospitalone: [phone]: [email or web address]: [email or web address]  06/18/2020 3:00 PM   Literature Support:  Curlene Labrum, Chanan-Khan A, Weisel K, Nooka AK, Masszi T, [place], [place] I, [place] V, Munder M, [place], Mark TM, Qi M, Schecter J, [place], Qin X, Deraedt W, Ahmadi T, Spencer A, Sonneveld P; CASTOR  Investigators. Daratumumab, Bortezomib, and Dexamethasone for Multiple Myeloma. [place] Med. 2016 Aug 25;375(8):754-66  --among patients with relapsed or relapsed and refractory multiple myeloma, daratumumab in combination with bortezomib and dexamethasone resulted in significantly longer progression-free survival than bortezomib and dexamethasone alone and was associated with infusion-related reactions and higher rates of thrombocytopenia and neutropenia than bortezomib and dexamethasone alone.

## 2020-06-25 ENCOUNTER — Inpatient Hospital Stay: Payer: Medicare (Managed Care) | Attending: Hematology and Oncology

## 2020-06-25 ENCOUNTER — Inpatient Hospital Stay: Payer: Medicare (Managed Care)

## 2020-06-25 ENCOUNTER — Other Ambulatory Visit: Payer: Medicare (Managed Care)

## 2020-06-25 ENCOUNTER — Other Ambulatory Visit: Payer: Self-pay

## 2020-06-25 VITALS — BP 106/81 | HR 94 | Temp 98.6°F | Resp 18

## 2020-06-25 DIAGNOSIS — Z7189 Other specified counseling: Secondary | ICD-10-CM

## 2020-06-25 DIAGNOSIS — Z79899 Other long term (current) drug therapy: Secondary | ICD-10-CM | POA: Diagnosis not present

## 2020-06-25 DIAGNOSIS — Z5112 Encounter for antineoplastic immunotherapy: Secondary | ICD-10-CM | POA: Insufficient documentation

## 2020-06-25 DIAGNOSIS — Z7984 Long term (current) use of oral hypoglycemic drugs: Secondary | ICD-10-CM | POA: Diagnosis not present

## 2020-06-25 DIAGNOSIS — Z86718 Personal history of other venous thrombosis and embolism: Secondary | ICD-10-CM | POA: Insufficient documentation

## 2020-06-25 DIAGNOSIS — C9002 Multiple myeloma in relapse: Secondary | ICD-10-CM

## 2020-06-25 DIAGNOSIS — E119 Type 2 diabetes mellitus without complications: Secondary | ICD-10-CM | POA: Insufficient documentation

## 2020-06-25 DIAGNOSIS — Z7901 Long term (current) use of anticoagulants: Secondary | ICD-10-CM | POA: Diagnosis not present

## 2020-06-25 LAB — CBC WITH DIFFERENTIAL (CANCER CENTER ONLY)
Abs Immature Granulocytes: 0.02 10*3/uL (ref 0.00–0.07)
Basophils Absolute: 0 10*3/uL (ref 0.0–0.1)
Basophils Relative: 1 %
Eosinophils Absolute: 0.4 10*3/uL (ref 0.0–0.5)
Eosinophils Relative: 5 %
HCT: 36.1 % — ABNORMAL LOW (ref 39.0–52.0)
Hemoglobin: 12.5 g/dL — ABNORMAL LOW (ref 13.0–17.0)
Immature Granulocytes: 0 %
Lymphocytes Relative: 12 %
Lymphs Abs: 1 10*3/uL (ref 0.7–4.0)
MCH: 26.9 pg (ref 26.0–34.0)
MCHC: 34.6 g/dL (ref 30.0–36.0)
MCV: 77.6 fL — ABNORMAL LOW (ref 80.0–100.0)
Monocytes Absolute: 0.7 10*3/uL (ref 0.1–1.0)
Monocytes Relative: 8 %
Neutro Abs: 6.2 10*3/uL (ref 1.7–7.7)
Neutrophils Relative %: 74 %
Platelet Count: 268 10*3/uL (ref 150–400)
RBC: 4.65 MIL/uL (ref 4.22–5.81)
RDW: 15.6 % — ABNORMAL HIGH (ref 11.5–15.5)
WBC Count: 8.3 10*3/uL (ref 4.0–10.5)
nRBC: 0 % (ref 0.0–0.2)

## 2020-06-25 LAB — CMP (CANCER CENTER ONLY)
ALT: 15 U/L (ref 0–44)
AST: 13 U/L — ABNORMAL LOW (ref 15–41)
Albumin: 3.6 g/dL (ref 3.5–5.0)
Alkaline Phosphatase: 80 U/L (ref 38–126)
Anion gap: 10 (ref 5–15)
BUN: 16 mg/dL (ref 8–23)
CO2: 26 mmol/L (ref 22–32)
Calcium: 9.7 mg/dL (ref 8.9–10.3)
Chloride: 108 mmol/L (ref 98–111)
Creatinine: 0.78 mg/dL (ref 0.61–1.24)
GFR, Estimated: >60 mL/min (ref >60)
Glucose, Bld: 91 mg/dL (ref 70–99)
Potassium: 3.9 mmol/L (ref 3.5–5.1)
Sodium: 144 mmol/L (ref 135–145)
Total Bilirubin: 0.4 mg/dL (ref 0.3–1.2)
Total Protein: 6.7 g/dL (ref 6.5–8.1)

## 2020-06-25 LAB — LACTATE DEHYDROGENASE: LDH: 103 U/L (ref 98–192)

## 2020-06-25 MED ORDER — ACETAMINOPHEN 325 MG PO TABS
650.0000 mg | ORAL_TABLET | Freq: Once | ORAL | Status: AC
  Administered 2020-06-25: 650 mg via ORAL

## 2020-06-25 MED ORDER — DIPHENHYDRAMINE HCL 25 MG PO CAPS
25.0000 mg | ORAL_CAPSULE | Freq: Once | ORAL | Status: AC
  Administered 2020-06-25: 25 mg via ORAL

## 2020-06-25 MED ORDER — ACETAMINOPHEN 325 MG PO TABS
ORAL_TABLET | ORAL | Status: AC
  Filled 2020-06-25: qty 2

## 2020-06-25 MED ORDER — DEXAMETHASONE 4 MG PO TABS
20.0000 mg | ORAL_TABLET | Freq: Once | ORAL | Status: AC
  Administered 2020-06-25: 20 mg via ORAL

## 2020-06-25 MED ORDER — DARATUMUMAB-HYALURONIDASE-FIHJ 1800-30000 MG-UT/15ML ~~LOC~~ SOLN
1800.0000 mg | Freq: Once | SUBCUTANEOUS | Status: AC
  Administered 2020-06-25: 1800 mg via SUBCUTANEOUS
  Filled 2020-06-25: qty 15

## 2020-06-25 MED ORDER — DIPHENHYDRAMINE HCL 25 MG PO CAPS
ORAL_CAPSULE | ORAL | Status: AC
  Filled 2020-06-25: qty 1

## 2020-06-25 MED ORDER — DEXAMETHASONE 4 MG PO TABS
ORAL_TABLET | ORAL | Status: AC
  Filled 2020-06-25: qty 5

## 2020-06-25 NOTE — Patient Instructions (Signed)
[facility] Discharge Instructions for Patients Receiving Chemotherapy  Today you received the following chemotherapy agents: Darzalex Faspro  To help prevent nausea and vomiting after your treatment, we encourage you to take your nausea medication as directed.    If you develop nausea and vomiting that is not controlled by your nausea medication, call the clinic.   BELOW ARE SYMPTOMS THAT SHOULD BE REPORTED IMMEDIATELY:  *FEVER GREATER THAN 100.5 F  *CHILLS WITH OR WITHOUT FEVER  NAUSEA AND VOMITING THAT IS NOT CONTROLLED WITH YOUR NAUSEA MEDICATION  *UNUSUAL SHORTNESS OF BREATH  *UNUSUAL BRUISING OR BLEEDING  TENDERNESS IN MOUTH AND THROAT WITH OR WITHOUT PRESENCE OF ULCERS  *URINARY PROBLEMS  *BOWEL PROBLEMS  UNUSUAL RASH Items with * indicate a potential emergency and should be followed up as soon as possible.  Feel free to call the clinic should you have any questions or concerns. The clinic phone number is [phone].  Please show the CHEMO ALERT CARD at check-in to the Emergency Department and triage nurse.   

## 2020-06-25 NOTE — Progress Notes (Signed)
Patient stable at discharge. Self-wheeled independently to exit.

## 2020-07-09 ENCOUNTER — Other Ambulatory Visit: Payer: Self-pay | Admitting: Hematology and Oncology

## 2020-07-09 ENCOUNTER — Inpatient Hospital Stay (HOSPITAL_BASED_OUTPATIENT_CLINIC_OR_DEPARTMENT_OTHER): Payer: Medicare (Managed Care) | Admitting: Hematology and Oncology

## 2020-07-09 ENCOUNTER — Other Ambulatory Visit: Payer: Medicare (Managed Care)

## 2020-07-09 ENCOUNTER — Inpatient Hospital Stay: Payer: Medicare (Managed Care)

## 2020-07-09 ENCOUNTER — Other Ambulatory Visit: Payer: Self-pay

## 2020-07-09 VITALS — BP 134/79 | HR 81 | Temp 97.8°F | Resp 18 | Ht 68.0 in | Wt 227.1 lb

## 2020-07-09 DIAGNOSIS — Z95828 Presence of other vascular implants and grafts: Secondary | ICD-10-CM

## 2020-07-09 DIAGNOSIS — C9002 Multiple myeloma in relapse: Secondary | ICD-10-CM | POA: Diagnosis not present

## 2020-07-09 DIAGNOSIS — G8220 Paraplegia, unspecified: Secondary | ICD-10-CM

## 2020-07-09 DIAGNOSIS — Z5112 Encounter for antineoplastic immunotherapy: Secondary | ICD-10-CM | POA: Diagnosis not present

## 2020-07-09 LAB — CBC WITH DIFFERENTIAL (CANCER CENTER ONLY)
Abs Immature Granulocytes: 0.01 10*3/uL (ref 0.00–0.07)
Basophils Absolute: 0 10*3/uL (ref 0.0–0.1)
Basophils Relative: 1 %
Eosinophils Absolute: 0.5 10*3/uL (ref 0.0–0.5)
Eosinophils Relative: 7 %
HCT: 36.1 % — ABNORMAL LOW (ref 39.0–52.0)
Hemoglobin: 12.4 g/dL — ABNORMAL LOW (ref 13.0–17.0)
Immature Granulocytes: 0 %
Lymphocytes Relative: 14 %
Lymphs Abs: 1.1 10*3/uL (ref 0.7–4.0)
MCH: 26.7 pg (ref 26.0–34.0)
MCHC: 34.3 g/dL (ref 30.0–36.0)
MCV: 77.6 fL — ABNORMAL LOW (ref 80.0–100.0)
Monocytes Absolute: 0.5 10*3/uL (ref 0.1–1.0)
Monocytes Relative: 7 %
Neutro Abs: 5.1 10*3/uL (ref 1.7–7.7)
Neutrophils Relative %: 71 %
Platelet Count: 372 10*3/uL (ref 150–400)
RBC: 4.65 MIL/uL (ref 4.22–5.81)
RDW: 15.8 % — ABNORMAL HIGH (ref 11.5–15.5)
WBC Count: 7.3 10*3/uL (ref 4.0–10.5)
nRBC: 0 % (ref 0.0–0.2)

## 2020-07-09 LAB — CMP (CANCER CENTER ONLY)
ALT: 18 U/L (ref 0–44)
AST: 13 U/L — ABNORMAL LOW (ref 15–41)
Albumin: 3.6 g/dL (ref 3.5–5.0)
Alkaline Phosphatase: 87 U/L (ref 38–126)
Anion gap: 9 (ref 5–15)
BUN: 13 mg/dL (ref 8–23)
CO2: 26 mmol/L (ref 22–32)
Calcium: 9.5 mg/dL (ref 8.9–10.3)
Chloride: 107 mmol/L (ref 98–111)
Creatinine: 0.76 mg/dL (ref 0.61–1.24)
GFR, Estimated: >60 mL/min (ref >60)
Glucose, Bld: 90 mg/dL (ref 70–99)
Potassium: 3.6 mmol/L (ref 3.5–5.1)
Sodium: 142 mmol/L (ref 135–145)
Total Bilirubin: 0.4 mg/dL (ref 0.3–1.2)
Total Protein: 7.1 g/dL (ref 6.5–8.1)

## 2020-07-09 LAB — LACTATE DEHYDROGENASE: LDH: 120 U/L (ref 98–192)

## 2020-07-16 ENCOUNTER — Encounter: Payer: Self-pay | Admitting: Hematology and Oncology

## 2020-07-16 NOTE — Progress Notes (Signed)
[place] [phone]   [phone]  PROGRESS NOTE  Patient Care Team: [name], MD as PCP - General (Family Medicine) [name], MD as Consulting Physician (Physical Medicine and Rehabilitation) Donia Guiles Lavon Paganini, PA-C as Physician Assistant (Physical Medicine and Rehabilitation) [name], MD as Consulting Physician (Hematology and Oncology)  Hematological/Oncological History # IgG Lambda Multiple Myeloma, Relapsed (ISS Stage II) 1) 06/2010: initial diagnosis of Multiple Myeloma after T8 compression fracture. Treated with Velcade/Revlimid/Dexamethasone and achieved a complete remission 2) Velcade was discontinued in September 2012 and that Revlimid and Decadron were discontinued in March 2013. 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 4) Followed by Dr. Alvy Bimler, last clinic visit 10/09/2019. At that time there was concern for relapse of his multiple myeloma.  5) Patient requested transfer to different provider after misunderstanding regarding imaging studies 6) 12/17/2019: transfer care to Dr. Lorenso Courier  7) 01/09/2020: Cycle 1 Day 1 of Dara/Velcade/Dex 8) 01/21/2020: presented as urgent visit for diarrhea and dehydration. Holding chemotherapy scheduled for 01/23/2020. 9) 01/30/2020: Resume dara/velcade/dex after resolution of diarrhea.  10) 02/13/2020: restaging labs show M protein 0.8, Kappa 4.5, lamba 17.2, ratio 0.26, urine M protein 53 (7.1%). All MM labs indicate improvement.  11) 03/10/2020: Cycle 4 Day 1 of Dara/Velcade/Dex. Transition to q 3 week daratumumab.  12) 06/04/2020:  Cycle 8 Day 1 of Dara/Velcade/Dex  Interval History:  [name] 64 y.o. male with medical history significant for  IgG Lambda Multiple Myeloma who presents for a follow up visit. The patient's last visit was on 06/18/2020. In the interim he has had no changes  in his health status.   On exam today [name] has been well in the interim since our last visit.  He reports that he did have some running of his bowels after he drank chocolate Ensure recently.  This resolved on its own without any intervention.  He notes that his ostomy output has returned back to normal.  He also reports he has had no issues with fevers, chills, sweats, nausea, vomiting or diarrhea.  His urine output has been normal and he denies having any numbness or tingling of his hands or arms.  He maintains his positive disposition today is willing and able to proceed with treatment.  A full 10 point ROS is listed below  MEDICAL HISTORY:  Past Medical History:  Diagnosis Date  . Adrenal insufficiency (HCC)    on chronic dexamethasone  . Anemia   . Cancer ([place])   . Coagulopathy ([place])    on xeralto/ s/p DVT while on coumadin,  IVC in place  . Diabetes mellitus without complication ([place])    type 2  . Gross hematuria 7/14   post foley cath procedure  . History of blood transfusion 7/14  . Multiple myeloma    thoracic T8 with paraplegia s/p resection- on chemo at visit 10/13/10  . Multiple myeloma   . Multiple myeloma without mention of remission   . Neurogenic bladder   . Neurogenic bowel   . Paraplegia ([place])   . Partial small bowel obstruction ([place]) during dec 2011 admission    SURGICAL HISTORY: Past Surgical History:  Procedure Laterality Date  . COLONOSCOPY WITH PROPOFOL N/A 04/12/2017   Procedure: COLONOSCOPY WITH PROPOFOL;  Surgeon: Irene Shipper, MD;  [facility];  Service: Endoscopy;  Laterality: N/A;  . COLONOSCOPY WITH PROPOFOL N/A 04/19/2017  Procedure: COLONOSCOPY WITH PROPOFOL;  Surgeon: Yetta Flock, MD;  [facility];  Service: Gastroenterology;  Laterality: N/A;  . COLOSTOMY  07/20/2011   Procedure: COLOSTOMY;  Surgeon: Judieth Keens, DO;  Location: [facility];  Service: General;;  . COLOSTOMY REVISION  07/20/2011   Procedure:  COLON RESECTION SIGMOID;  Surgeon: Judieth Keens, DO;  Location: [facility];  Service: General;;  . CYSTOSCOPY N/A 04/04/2013   Procedure: CYSTOSCOPY WITH LITHALOPAXY;  Surgeon: Alexis Frock, MD;  [facility];  Service: Urology;  Laterality: N/A;  . INSERTION OF SUPRAPUBIC CATHETER N/A 04/04/2013   Procedure: INSERTION OF SUPRAPUBIC CATHETER;  Surgeon: Alexis Frock, MD;  [facility];  Service: Urology;  Laterality: N/A;  . LAPAROTOMY  07/20/2011   Procedure: EXPLORATORY LAPAROTOMY;  Surgeon: Judieth Keens, DO;  Location: [place] [facility];  Service: General;  Laterality: N/A;  . myeloma thoracic T8 with parpaplegia s/p thoracotomy and thoracic T7-9 cage placement on Dec 26th 2011  07/18/10    SOCIAL HISTORY: Social History   Socioeconomic History  . Marital status: Married    Spouse name: Not on file  . Number of children: Not on file  . Years of education: Not on file  . Highest education level: Not on file  Occupational History  . Not on file  Tobacco Use  . Smoking status: Never Smoker  . Smokeless tobacco: Never Used  Vaping Use  . Vaping Use: Never used  Substance and Sexual Activity  . Alcohol use: No  . Drug use: No  . Sexual activity: Never  Other Topics Concern  . Not on file  Social History Narrative  . Not on file   Social Determinants of Health   Financial Resource Strain: Not on file  Food Insecurity: Not on file  Transportation Needs: Not on file  Physical Activity: Not on file  Stress: Not on file  Social Connections: Not on file  Intimate Partner Violence: Not on file    FAMILY HISTORY: Family History  Problem Relation Age of Onset  . Ovarian cancer Mother   . Diabetes Father     ALLERGIES:  is allergic to feraheme [ferumoxytol].  MEDICATIONS:  Current Outpatient Medications  Medication Sig Dispense Refill  . acyclovir (ZOVIRAX) 400 MG tablet Take 1 tablet (400 mg total) by mouth 2 (two) times daily. 60 tablet 2  . atorvastatin (LIPITOR)  10 MG tablet Take 10 mg by mouth daily.    . baclofen (LIORESAL) 20 MG tablet Take 20 mg by mouth 2 (two) times daily.    . diazepam (DIASTAT ACUDIAL) 10 MG GEL SMARTSIG:By Mouth    . dorzolamide (TRUSOPT) 2 % ophthalmic solution 1 drop 2 (two) times daily.    . iron polysaccharides (NU-IRON) 150 MG capsule Take 1 capsule (150 mg total) by mouth daily. 30 capsule 0  . latanoprost (XALATAN) 0.005 % ophthalmic solution Place 1 drop into both eyes at bedtime.    . metFORMIN (GLUCOPHAGE) 500 MG tablet Take 500 mg by mouth 2 (two) times daily with a meal.    . ondansetron (ZOFRAN) 8 MG tablet Take 1 tablet (8 mg total) by mouth every 8 (eight) hours as needed for nausea or vomiting. 30 tablet 0  . rivaroxaban (XARELTO) 10 MG TABS tablet Take 1 tablet (10 mg total) by mouth daily with supper. 30 tablet 9  . Skin Protectants, Misc. (EUCERIN) cream Apply 1 application topically 2 (two) times daily as needed for dry skin.     Marland Kitchen  zinc oxide (BALMEX) 11.3 % CREA cream Apply 1 application topically 2 (two) times daily.     No current facility-administered medications for this visit.    REVIEW OF SYSTEMS:   Constitutional: ( - ) fevers, ( - )  chills , ( - ) night sweats Eyes: ( - ) blurriness of vision, ( - ) double vision, ( - ) watery eyes Ears, nose, mouth, throat, and face: ( - ) mucositis, ( - ) sore throat Respiratory: ( - ) cough, ( - ) dyspnea, ( - ) wheezes Cardiovascular: ( - ) palpitation, ( - ) chest discomfort, ( - ) lower extremity swelling Gastrointestinal:  ( - ) nausea, ( - ) heartburn, ( - ) change in bowel habits Skin: ( - ) abnormal skin rashes Lymphatics: ( - ) new lymphadenopathy, ( - ) easy bruising Neurological: ( - ) numbness, ( - ) tingling, ( - ) new weaknesses Behavioral/Psych: ( - ) mood change, ( - ) new changes  All other systems were reviewed with the patient and are negative.  PHYSICAL EXAMINATION: ECOG PERFORMANCE STATUS: paraplegic.   Vitals:   07/09/20 1440   BP: 134/79  Pulse: 81  Resp: 18  Temp: 97.8 F (36.6 C)  SpO2: 100%   Filed Weights   07/09/20 1440  Weight: 227 lb 1.6 oz (103 kg)    GENERAL: well appearing middle aged [place] American male alert, no distress and comfortable SKIN: skin color, texture, turgor are normal, no rashes or significant lesions EYES: conjunctiva are pink and non-injected, sclera clear LUNGS: clear to auscultation and percussion with normal breathing effort HEART: regular rate & rhythm and no murmurs and no lower extremity edema Musculoskeletal: no cyanosis of digits and no clubbing  PSYCH: alert & oriented x 3, fluent speech NEURO: paraplegic, no use of LE bilaterally.   LABORATORY DATA:  I have reviewed the data as listed CBC Latest Ref Rng & Units 07/09/2020 06/25/2020 06/18/2020  WBC 4.0 - 10.5 K/uL 7.3 8.3 7.2  Hemoglobin 13.0 - 17.0 g/dL 12.4(L) 12.5(L) 12.2(L)  Hematocrit 39.0 - 52.0 % 36.1(L) 36.1(L) 35.1(L)  Platelets 150 - 400 K/uL 372 268 265    CMP Latest Ref Rng & Units 07/09/2020 06/25/2020 06/18/2020  Glucose 70 - 99 mg/dL 90 91 95  BUN 8 - 23 mg/dL 13 16 17   Creatinine 0.61 - 1.24 mg/dL 0.76 0.78 0.80  Sodium 135 - 145 mmol/L 142 144 140  Potassium 3.5 - 5.1 mmol/L 3.6 3.9 3.8  Chloride 98 - 111 mmol/L 107 108 103  CO2 22 - 32 mmol/L 26 26 27   Calcium 8.9 - 10.3 mg/dL 9.5 9.7 9.7  Total Protein 6.5 - 8.1 g/dL 7.1 6.7 6.6  Total Bilirubin 0.3 - 1.2 mg/dL 0.4 0.4 0.5  Alkaline Phos 38 - 126 U/L 87 80 77  AST 15 - 41 U/L 13(L) 13(L) 12(L)  ALT 0 - 44 U/L 18 15 16     Lab Results  Component Value Date   MPROTEIN 0.2 (H) 06/04/2020   MPROTEIN 0.2 (H) 04/30/2020   MPROTEIN 0.3 (H) 03/17/2020   Lab Results  Component Value Date   KPAFRELGTCHN 6.1 06/04/2020   KPAFRELGTCHN 6.0 04/30/2020   KPAFRELGTCHN 4.8 03/17/2020   LAMBDASER 6.8 06/04/2020   LAMBDASER 5.5 (L) 04/30/2020   LAMBDASER 11.8 03/17/2020   KAPLAMBRATIO 0.90 06/04/2020   KAPLAMBRATIO >3.69 05/07/2020    KAPLAMBRATIO 1.09 04/30/2020    RADIOGRAPHIC STUDIES: No results found.  ASSESSMENT & PLAN Gary Lamphier  Howell 64 y.o. male with medical history significant for  IgG Lambda Multiple Myeloma who presents for a follow up visit.  After review of the labs, discussion with the patient, and reviewed the imaging his findings are most consistent with a relapsed multiple myeloma.The patient has had excellent success before with treatment of his myeloma with Velcade, Revlimid, and dexamethasone.  On exam today [name] stable at his baseline level of health.  He did have some diarrhea after eating a chocolate Ensure but otherwise has been asymptomatic.  He is willing and able to proceed with treatment at this time   Treatment regimen consists of bortezomib 1.2m/m2 subq (Day 1,8, and 15), daratumumab 1800 mg subq (day 1,8 and 15), and dexamethasone 20 mg PO (day 1, 8 and 15). After 3 cycles, daratumumab to be administered q 3 weeks (from cycles 4-8). After cycle 8, continue daratumumab q 4 weeks alone (N Engl J Med 2016; [id number]  # IgG Lambda Multiple Myeloma, Relapsed (ISS Stage II) --findings are most consistent with relapsed multiple myeloma. Patient previously successfully treated with Velcade/Rev/Dex --current plan is Daratumumab/Velcade/Dex  (started 01/09/2020). Continue weekly velcade with q 3 week dara. On 07/02/2020 he transitioned to monthly daratumumab alone --recollect restaging MM labs monthly to include MM panel, SFLC. Additionally will collect LDH and beta 2 microglobulin.  --will have patient evaluated by dentistry prior to restarting Zometa therapy. He had an episode of osteonecrosis in the past, risk of restarting may outweight benefit.  --return for start of Cycle 10 on 07/23/2020.  RTC for Cycle 11 on 08/20/2019.   #History of DVT --He had placement of IVC filter, remains on Xarelto. --Due to poor mobility, and lack of bleeding complications, I recommend he remain on Xarelto  indefinitely. --caution if Plt count were to drop <50  # Supportive Care -- provided patient with an albuterol inhaler (for use with daratumumab) --acyclovir 4060mBID for VZV prophylaxis --zofran 60m11m8H PRN for nausea/vomiting  --Zometa can be considered after dental clearance, though with his prior episode of osteonecrosis the risk may outweigh the benefits. --patient recieved pretreatment RBC phenotype  No orders of the defined types were placed in this encounter.  All questions were answered. The patient knows to call the clinic with any problems, questions or concerns.  A total of more than 30 minutes were spent on this encounter and over half of that time was spent on counseling and coordination of care as outlined above.   JohLedell PeoplesD Department of Hematology/Oncology [facility]: [phone]: [phone]: [email or web address]  07/16/2020 10:59 AM   Literature Support:  PalArchie Endoeisel K, Raliegh Ipooka AK, Masszi T, [place] Munder M, MatWatkinsark TM, Qi M, Schecter J, AmiLaSallein X, Deraedt W, Ahmadi T, Spencer A, Sonneveld P; [employer] Investigators. Daratumumab, Bortezomib, and Dexamethasone for Multiple Myeloma. N EAlta Corningd. 2016 Aug 25;375([id number]):[id number]  --among patients with relapsed or relapsed and refractory multiple myeloma, daratumumab in combination with bortezomib and dexamethasone resulted in significantly longer progression-free survival than bortezomib and dexamethasone alone and was associated with infusion-related reactions and higher rates of thrombocytopenia and neutropenia than bortezomib and dexamethasone alone.

## 2020-07-22 ENCOUNTER — Other Ambulatory Visit: Payer: Medicare (Managed Care)

## 2020-07-22 ENCOUNTER — Other Ambulatory Visit: Payer: Self-pay

## 2020-07-22 ENCOUNTER — Inpatient Hospital Stay: Payer: Medicare (Managed Care)

## 2020-07-22 VITALS — BP 128/87 | HR 99 | Temp 98.5°F | Resp 18

## 2020-07-22 DIAGNOSIS — Z5112 Encounter for antineoplastic immunotherapy: Secondary | ICD-10-CM | POA: Diagnosis not present

## 2020-07-22 DIAGNOSIS — C9002 Multiple myeloma in relapse: Secondary | ICD-10-CM

## 2020-07-22 DIAGNOSIS — Z7189 Other specified counseling: Secondary | ICD-10-CM

## 2020-07-22 LAB — CBC WITH DIFFERENTIAL (CANCER CENTER ONLY)
Abs Immature Granulocytes: 0.02 10*3/uL (ref 0.00–0.07)
Basophils Absolute: 0.1 10*3/uL (ref 0.0–0.1)
Basophils Relative: 1 %
Eosinophils Absolute: 0.3 10*3/uL (ref 0.0–0.5)
Eosinophils Relative: 5 %
HCT: 35.8 % — ABNORMAL LOW (ref 39.0–52.0)
Hemoglobin: 12.2 g/dL — ABNORMAL LOW (ref 13.0–17.0)
Immature Granulocytes: 0 %
Lymphocytes Relative: 15 %
Lymphs Abs: 1 10*3/uL (ref 0.7–4.0)
MCH: 26.3 pg (ref 26.0–34.0)
MCHC: 34.1 g/dL (ref 30.0–36.0)
MCV: 77.3 fL — ABNORMAL LOW (ref 80.0–100.0)
Monocytes Absolute: 0.6 10*3/uL (ref 0.1–1.0)
Monocytes Relative: 8 %
Neutro Abs: 4.7 10*3/uL (ref 1.7–7.7)
Neutrophils Relative %: 71 %
Platelet Count: 314 10*3/uL (ref 150–400)
RBC: 4.63 MIL/uL (ref 4.22–5.81)
RDW: 16.2 % — ABNORMAL HIGH (ref 11.5–15.5)
WBC Count: 6.6 10*3/uL (ref 4.0–10.5)
nRBC: 0 % (ref 0.0–0.2)

## 2020-07-22 LAB — CMP (CANCER CENTER ONLY)
ALT: 16 U/L (ref 0–44)
AST: 14 U/L — ABNORMAL LOW (ref 15–41)
Albumin: 3.7 g/dL (ref 3.5–5.0)
Alkaline Phosphatase: 82 U/L (ref 38–126)
Anion gap: 6 (ref 5–15)
BUN: 10 mg/dL (ref 8–23)
CO2: 29 mmol/L (ref 22–32)
Calcium: 9.8 mg/dL (ref 8.9–10.3)
Chloride: 107 mmol/L (ref 98–111)
Creatinine: 0.78 mg/dL (ref 0.61–1.24)
GFR, Estimated: >60 mL/min (ref >60)
Glucose, Bld: 113 mg/dL — ABNORMAL HIGH (ref 70–99)
Potassium: 3.7 mmol/L (ref 3.5–5.1)
Sodium: 142 mmol/L (ref 135–145)
Total Bilirubin: 0.5 mg/dL (ref 0.3–1.2)
Total Protein: 6.9 g/dL (ref 6.5–8.1)

## 2020-07-22 LAB — LACTATE DEHYDROGENASE: LDH: 95 U/L — ABNORMAL LOW (ref 98–192)

## 2020-07-22 MED ORDER — DEXAMETHASONE 4 MG PO TABS
20.0000 mg | ORAL_TABLET | Freq: Once | ORAL | Status: AC
  Administered 2020-07-22: 09:00:00 20 mg via ORAL

## 2020-07-22 MED ORDER — DIPHENHYDRAMINE HCL 25 MG PO CAPS
ORAL_CAPSULE | ORAL | Status: AC
  Filled 2020-07-22: qty 1

## 2020-07-22 MED ORDER — DARATUMUMAB-HYALURONIDASE-FIHJ 1800-30000 MG-UT/15ML ~~LOC~~ SOLN
1800.0000 mg | Freq: Once | SUBCUTANEOUS | Status: AC
Start: 2020-07-22 — End: 2020-07-22
  Administered 2020-07-22: 10:00:00 1800 mg via SUBCUTANEOUS
  Filled 2020-07-22: qty 15

## 2020-07-22 MED ORDER — ACETAMINOPHEN 325 MG PO TABS
650.0000 mg | ORAL_TABLET | Freq: Once | ORAL | Status: AC
  Administered 2020-07-22: 09:00:00 650 mg via ORAL

## 2020-07-22 MED ORDER — ACETAMINOPHEN 325 MG PO TABS
ORAL_TABLET | ORAL | Status: AC
  Filled 2020-07-22: qty 2

## 2020-07-22 MED ORDER — DIPHENHYDRAMINE HCL 25 MG PO CAPS
25.0000 mg | ORAL_CAPSULE | Freq: Once | ORAL | Status: AC
  Administered 2020-07-22: 09:00:00 25 mg via ORAL

## 2020-07-22 MED ORDER — DEXAMETHASONE 4 MG PO TABS
ORAL_TABLET | ORAL | Status: AC
  Filled 2020-07-22: qty 5

## 2020-07-22 NOTE — Patient Instructions (Signed)
[facility] Discharge Instructions for Patients Receiving Chemotherapy  Today you received the following chemotherapy agent: Darzalex Faspro  To help prevent nausea and vomiting after your treatment, we encourage you to take your nausea medication as directed.    If you develop nausea and vomiting that is not controlled by your nausea medication, call the clinic.   BELOW ARE SYMPTOMS THAT SHOULD BE REPORTED IMMEDIATELY:  *FEVER GREATER THAN 100.5 F  *CHILLS WITH OR WITHOUT FEVER  NAUSEA AND VOMITING THAT IS NOT CONTROLLED WITH YOUR NAUSEA MEDICATION  *UNUSUAL SHORTNESS OF BREATH  *UNUSUAL BRUISING OR BLEEDING  TENDERNESS IN MOUTH AND THROAT WITH OR WITHOUT PRESENCE OF ULCERS  *URINARY PROBLEMS  *BOWEL PROBLEMS  UNUSUAL RASH Items with * indicate a potential emergency and should be followed up as soon as possible.  Feel free to call the clinic should you have any questions or concerns. The clinic phone number is [phone].  Please show the [facility] at check-in to the Emergency Department and triage nurse.

## 2020-07-23 LAB — KAPPA/LAMBDA LIGHT CHAINS
Kappa free light chain: 7.2 mg/L (ref 3.3–19.4)
Kappa, lambda light chain ratio: 0.9 (ref 0.26–1.65)
Lambda free light chains: 8 mg/L (ref 5.7–26.3)

## 2020-07-26 LAB — MULTIPLE MYELOMA PANEL, SERUM
Albumin SerPl Elph-Mcnc: 3.6 g/dL (ref 2.9–4.4)
Albumin/Glob SerPl: 1.3 (ref 0.7–1.7)
Alpha 1: 0.3 g/dL (ref 0.0–0.4)
Alpha2 Glob SerPl Elph-Mcnc: 1 g/dL (ref 0.4–1.0)
B-Globulin SerPl Elph-Mcnc: 0.9 g/dL (ref 0.7–1.3)
Gamma Glob SerPl Elph-Mcnc: 0.6 g/dL (ref 0.4–1.8)
Globulin, Total: 2.8 g/dL (ref 2.2–3.9)
IgA: 28 mg/dL — ABNORMAL LOW (ref 61–437)
IgG (Immunoglobin G), Serum: 655 mg/dL (ref 603–1613)
IgM (Immunoglobulin M), Srm: 15 mg/dL — ABNORMAL LOW (ref 20–172)
M Protein SerPl Elph-Mcnc: 0.3 g/dL — ABNORMAL HIGH
Total Protein ELP: 6.4 g/dL (ref 6.0–8.5)

## 2020-07-27 ENCOUNTER — Telehealth: Payer: Self-pay | Admitting: Hematology and Oncology

## 2020-07-27 NOTE — Telephone Encounter (Signed)
Rescheduled appointment per 1/4 schedule message. Patient's wife is aware of changes.

## 2020-08-05 ENCOUNTER — Ambulatory Visit: Payer: Self-pay

## 2020-08-05 ENCOUNTER — Ambulatory Visit (INDEPENDENT_AMBULATORY_CARE_PROVIDER_SITE_OTHER): Payer: Medicare (Managed Care)

## 2020-08-05 ENCOUNTER — Ambulatory Visit (INDEPENDENT_AMBULATORY_CARE_PROVIDER_SITE_OTHER): Payer: Medicare (Managed Care) | Admitting: Orthopedic Surgery

## 2020-08-05 DIAGNOSIS — M899 Disorder of bone, unspecified: Secondary | ICD-10-CM | POA: Diagnosis not present

## 2020-08-06 ENCOUNTER — Encounter: Payer: Self-pay | Admitting: Orthopedic Surgery

## 2020-08-06 NOTE — Progress Notes (Signed)
Office Visit Note   Patient: [name]           Date of Birth: 01-05-1956           MRN: [id number] Visit Date: 08/05/2020              Requested by: [name], MD [address] [place],  [place] [address] PCP: [name], MD  Chief Complaint  Patient presents with  . Left Leg - Follow-up    Multiple myeloma lytic lesion bilateral femur  . Right Leg - Follow-up      HPI: Patient presents in follow-up for the lytic lesions both femur with history of multiple myeloma.  Patient is still proceeding with chemotherapy 2 times a month.  Patient ambulates in a motorized wheelchair he is insensate in the lower extremities and has no reports of pain.  Assessment & Plan: Visit Diagnoses:  1. Lytic bone lesion of right femur   2. Lytic bone lesion of left femur     Plan: Patient's femoral lesions appear stable there is been no advancement of the lytic changes he has greater than 50% cortical integrity and should not be at risk of pathologic fracture we will repeat 2 view radiographs of both femurs at follow-up in 3 months.  Follow-Up Instructions: No follow-ups on file.   Ortho Exam  Patient is alert, oriented, no adenopathy, well-dressed, normal affect, normal respiratory effort. Examination patient is comfortable sitting in a motorized wheelchair he does have an insensate bilateral lower extremities has no reports of pain radiographs shows stable lytic lesions with no advancement.  Imaging: No results found. No images are attached to the encounter.  Labs: Lab Results  Component Value Date   HGBA1C 6.0 (H) 07/22/2014   ESRSEDRATE 73 (H) 10/21/2013   ESRSEDRATE 55 (H) 07/23/2013   ESRSEDRATE 95 (H) 07/13/2010   CRP 1.0 (H) 07/14/2010   LABURIC 8.2 (H) 07/21/2014   LABURIC 5.1 09/02/2010   REPTSTATUS 01/25/2020 FINAL 01/21/2020   CULT (A) 01/21/2020    >=100,000 COLONIES/mL PROTEUS MIRABILIS 20,000 COLONIES/mL STAPHYLOCOCCUS AUREUS    LABORGA PROTEUS  MIRABILIS (A) 01/21/2020   LABORGA STAPHYLOCOCCUS AUREUS (A) 01/21/2020     Lab Results  Component Value Date   ALBUMIN 3.7 07/22/2020   ALBUMIN 3.6 07/09/2020   ALBUMIN 3.6 06/25/2020   LABURIC 8.2 (H) 07/21/2014   LABURIC 5.1 09/02/2010    Lab Results  Component Value Date   MG 1.8 01/21/2020   MG 1.5 07/21/2014   MG 1.3 (L) 02/13/2013   Lab Results  Component Value Date   VD25OH 59 04/08/2013   VD25OH 58 01/16/2013   VD25OH 63 11/15/2012    No results found for: PREALBUMIN CBC EXTENDED Latest Ref Rng & Units 07/22/2020 07/09/2020 06/25/2020  WBC 4.0 - 10.5 K/uL 6.6 7.3 8.3  RBC 4.22 - 5.81 MIL/uL 4.63 4.65 4.65  HGB 13.0 - 17.0 g/dL 12.2(L) 12.4(L) 12.5(L)  HCT 39.0 - 52.0 % 35.8(L) 36.1(L) 36.1(L)  PLT 150 - 400 K/uL 314 372 268  NEUTROABS 1.7 - 7.7 K/uL 4.7 5.1 6.2  LYMPHSABS 0.7 - 4.0 K/uL 1.0 1.1 1.0     There is no height or weight on file to calculate BMI.  Orders:  Orders Placed This Encounter  Procedures  . XR FEMUR, MIN 2 VIEWS RIGHT  . XR FEMUR MIN 2 VIEWS LEFT   No orders of the defined types were placed in this encounter.    Procedures: No procedures  performed  Clinical Data: No additional findings.  ROS:  All other systems negative, except as noted in the HPI. Review of Systems  Objective: Vital Signs: There were no vitals taken for this visit.  Specialty Comments:  No specialty comments available.  PMFS History: Patient Active Problem List   Diagnosis Date Noted  . Goals of care, counseling/discussion 10/09/2019  . On rivaroxaban therapy   . Status post colonoscopy with polypectomy   . Lower GI bleeding 04/18/2017  . Acute blood loss anemia 04/18/2017  . Diabetes mellitus type 2 in obese ([place]) 04/18/2017  . Colon cancer screening   . Benign neoplasm of ascending colon   . History of DVT (deep vein thrombosis) 06/10/2015  . Obstructed suprapubic catheter ([place]) 07/21/2014  . Bilateral hydronephrosis 07/21/2014  . Acute  kidney failure ([place]) 07/20/2014  . Abdominal pain 07/20/2014  . AKI (acute kidney injury) ([place]) 07/20/2014  . Hematuria 02/12/2013  . Shock ([place]) 02/12/2013  . Adrenal insufficiency ([place]) 02/12/2013  . Hypoalbuminemia 11/15/2012  . Anemia, unspecified 09/18/2012  . Unspecified vitamin D deficiency 09/18/2012  . 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 Iron deficiency anemia 09/18/2012  . Diverticulitis of large intestine with perforation 07/26/2011  . Multiple myeloma in relapse ([place]) 10/14/2010  . Neurogenic bowel 10/14/2010  . Paraplegia ([place]) 07/13/2010  . Neurogenic bladder 07/11/2010  . BACK PAIN, LUMBAR, WITH RADICULOPATHY 07/07/2010  . DISEQUILIBRIUM 07/07/2010  . Abdominal pain, generalized 07/07/2010  . OBESITY, NOS 09/20/2006   Past Medical History:  Diagnosis Date  . Adrenal insufficiency (HCC)    on chronic dexamethasone  . Anemia   . Cancer ([place])   . Coagulopathy ([place])    on xeralto/ s/p DVT while on coumadin,  IVC in place  . Diabetes mellitus without complication ([place])    type 2  . Gross hematuria 7/14   post foley cath procedure  . History of blood transfusion 7/14  . Multiple myeloma    thoracic T8 with paraplegia s/p resection- on chemo at visit 10/13/10  . Multiple myeloma   . Multiple myeloma without mention of remission   . Neurogenic bladder   . Neurogenic bowel   . Paraplegia ([place])   . Partial small bowel obstruction ([place]) during dec 2011 admission    Family History  Problem Relation Age of Onset  . Ovarian cancer Mother   . Diabetes Father     Past Surgical History:  Procedure Laterality Date  . COLONOSCOPY WITH PROPOFOL N/A 04/12/2017   Procedure: COLONOSCOPY WITH PROPOFOL;  Surgeon: Irene Shipper, MD;  [facility];  Service: Endoscopy;  Laterality: N/A;  . COLONOSCOPY WITH PROPOFOL N/A 04/19/2017   Procedure: COLONOSCOPY WITH PROPOFOL;  Surgeon: Yetta Flock, MD;  [facility];   Service: Gastroenterology;  Laterality: N/A;  . COLOSTOMY  07/20/2011   Procedure: COLOSTOMY;  Surgeon: Judieth Keens, DO;  Location: [facility] OR;  Service: General;;  . COLOSTOMY REVISION  07/20/2011   Procedure: COLON RESECTION SIGMOID;  Surgeon: Judieth Keens, DO;  Location: [facility];  Service: General;;  . CYSTOSCOPY N/A 04/04/2013   Procedure: CYSTOSCOPY WITH LITHALOPAXY;  Surgeon: Alexis Frock, MD;  [facility];  Service: Urology;  Laterality: N/A;  . INSERTION OF SUPRAPUBIC CATHETER N/A 04/04/2013   Procedure: INSERTION OF SUPRAPUBIC CATHETER;  Surgeon: Alexis Frock, MD;  [facility];  Service: Urology;  Laterality: N/A;  . LAPAROTOMY  07/20/2011   Procedure: EXPLORATORY LAPAROTOMY;  Surgeon: Aaron Edelman  Billy Fischer, DO;  Location[facility];  Service: General;  Laterality: N/A;  . myeloma thoracic T8 with parpaplegia s/p thoracotomy and thoracic T7-9 cage placement on Dec 26th 2011  07/18/10   Social History   Occupational History  . Not on file  Tobacco Use  . Smoking status: Never Smoker  . Smokeless tobacco: Never Used  Vaping Use  . Vaping Use: Never used  Substance and Sexual Activity  . Alcohol use: No  . Drug use: No  . Sexual activity: Never

## 2020-08-17 NOTE — Progress Notes (Unsigned)
[place] [phone]   [phone]  PROGRESS NOTE  Patient Care Team: [name], MD as PCP - General (Family Medicine) [name], MD as Consulting Physician (Physical Medicine and Rehabilitation) Donia Guiles Lavon Paganini, PA-C as Physician Assistant (Physical Medicine and Rehabilitation) [name], MD as Consulting Physician (Hematology and Oncology)  Hematological/Oncological History # IgG Lambda Multiple Myeloma, Relapsed (ISS Stage II) 1) 06/2010: initial diagnosis of Multiple Myeloma after T8 compression fracture. Treated with Velcade/Revlimid/Dexamethasone and achieved a complete remission 2) Velcade was discontinued in September 2012 and that Revlimid and Decadron were discontinued in March 2013. 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 4) Followed by Dr. Alvy Bimler, last clinic visit 10/09/2019. At that time there was concern for relapse of his multiple myeloma.  5) Patient requested transfer to different provider after misunderstanding regarding imaging studies 6) 12/17/2019: transfer care to Dr. Lorenso Courier  7) 01/09/2020: Cycle 1 Day 1 of Dara/Velcade/Dex 8) 01/21/2020: presented as urgent visit for diarrhea and dehydration. Holding chemotherapy scheduled for 01/23/2020. 9) 01/30/2020: Resume dara/velcade/dex after resolution of diarrhea.  10) 02/13/2020: restaging labs show M protein 0.8, Kappa 4.5, lamba 17.2, ratio 0.26, urine M protein 53 (7.1%). All MM labs indicate improvement.  11) 03/10/2020: Cycle 4 Day 1 of Dara/Velcade/Dex. Transition to q 3 week daratumumab.  12) 06/04/2020:  Cycle 8 Day 1 of Dara/Velcade/Dex 13) 06/25/2020:  Cycle 9 Day 1 of Dara/Velcade/Dex 14) 07/22/2020: Cycle 10 Day 1 of Dara/Velcade/Dex 15) 08/18/2020: Cycle 11 Day 1 of Dara/Velcade/Dex  Interval History:  [name] 65 y.o. male with medical history  significant for  IgG Lambda Multiple Myeloma who presents for a follow up visit. The patient's last visit was on 07/09/2020. In the interim he has had no changes in his health status.   On exam today [name] has been well in the interim since our last visit.  Notes he has been at his baseline level of health.  He has lost about 19 pounds which he attributes to his trip fasting.  He reports he is only able to eat certain foods predominantly veggies with no meat and is not able to eat fried foods.  He is also only drinking water. He reports that his urine and ostomy outputs have both been stable and normal. He denies have any numbness or tingling of his fingers or hands. He denies any fevers, chills, sweats, nausea, or diarrhea. A full 10 point ROS is listed below  MEDICAL HISTORY:  Past Medical History:  Diagnosis Date  . Adrenal insufficiency (HCC)    on chronic dexamethasone  . Anemia   . Cancer ([place])   . Coagulopathy ([place])    on xeralto/ s/p DVT while on coumadin,  IVC in place  . Diabetes mellitus without complication ([place])    type 2  . Gross hematuria 7/14   post foley cath procedure  . History of blood transfusion 7/14  . Multiple myeloma    thoracic T8 with paraplegia s/p resection- on chemo at visit 10/13/10  . Multiple myeloma   . Multiple myeloma without mention of remission   . Neurogenic bladder   . Neurogenic bowel   . Paraplegia ([place])   . Partial small bowel obstruction ([place]) during dec 2011 admission    SURGICAL HISTORY: Past Surgical History:  Procedure Laterality Date  . COLONOSCOPY WITH PROPOFOL N/A 04/12/2017   Procedure: COLONOSCOPY WITH PROPOFOL;  Surgeon: Irene Shipper, MD;  Location: [facility];  Service: Endoscopy;  Laterality: N/A;  . COLONOSCOPY WITH PROPOFOL N/A 04/19/2017   Procedure: COLONOSCOPY WITH PROPOFOL;  Surgeon: Yetta Flock, MD;  Location: [facility];  Service: Gastroenterology;  Laterality: N/A;  . COLOSTOMY  07/20/2011   Procedure:  COLOSTOMY;  Surgeon: Judieth Keens, DO;  Location: [facility];  Service: General;;  . COLOSTOMY REVISION  07/20/2011   Procedure: COLON RESECTION SIGMOID;  Surgeon: Judieth Keens, DO;  Location: [place];  Service: General;;  . CYSTOSCOPY N/A 04/04/2013   Procedure: CYSTOSCOPY WITH LITHALOPAXY;  Surgeon: Alexis Frock, MD;  Location: [facility];  Service: Urology;  Laterality: N/A;  . INSERTION OF SUPRAPUBIC CATHETER N/A 04/04/2013   Procedure: INSERTION OF SUPRAPUBIC CATHETER;  Surgeon: Alexis Frock, MD;  Location: [facility];  Service: Urology;  Laterality: N/A;  . LAPAROTOMY  07/20/2011   Procedure: EXPLORATORY LAPAROTOMY;  Surgeon: Judieth Keens, DO;  Location: [place];  Service: General;  Laterality: N/A;  . myeloma thoracic T8 with parpaplegia s/p thoracotomy and thoracic T7-9 cage placement on Dec 26th 2011  07/18/10    SOCIAL HISTORY: Social History   Socioeconomic History  . Marital status: Married    Spouse name: Not on file  . Number of children: Not on file  . Years of education: Not on file  . Highest education level: Not on file  Occupational History  . Not on file  Tobacco Use  . Smoking status: Never Smoker  . Smokeless tobacco: Never Used  Vaping Use  . Vaping Use: Never used  Substance and Sexual Activity  . Alcohol use: No  . Drug use: No  . Sexual activity: Never  Other Topics Concern  . Not on file  Social History Narrative  . Not on file   Social Determinants of Health   Financial Resource Strain: Not on file  Food Insecurity: Not on file  Transportation Needs: Not on file  Physical Activity: Not on file  Stress: Not on file  Social Connections: Not on file  Intimate Partner Violence: Not on file    FAMILY HISTORY: Family History  Problem Relation Age of Onset  . Ovarian cancer Mother   . Diabetes Father     ALLERGIES:  is allergic to feraheme [ferumoxytol].  MEDICATIONS:  Current Outpatient Medications  Medication Sig Dispense Refill  .  acyclovir (ZOVIRAX) 400 MG tablet Take 1 tablet (400 mg total) by mouth 2 (two) times daily. 60 tablet 2  . atorvastatin (LIPITOR) 10 MG tablet Take 10 mg by mouth daily.    . baclofen (LIORESAL) 20 MG tablet Take 20 mg by mouth 2 (two) times daily.    . diazepam (DIASTAT ACUDIAL) 10 MG GEL SMARTSIG:By Mouth    . dorzolamide (TRUSOPT) 2 % ophthalmic solution 1 drop 2 (two) times daily.    . iron polysaccharides (NU-IRON) 150 MG capsule Take 1 capsule (150 mg total) by mouth daily. 30 capsule 0  . latanoprost (XALATAN) 0.005 % ophthalmic solution Place 1 drop into both eyes at bedtime.    . metFORMIN (GLUCOPHAGE) 500 MG tablet Take 500 mg by mouth 2 (two) times daily with a meal.    . ondansetron (ZOFRAN) 8 MG tablet Take 1 tablet (8 mg total) by mouth every 8 (eight) hours as needed for nausea or vomiting. 30 tablet 0  . rivaroxaban (XARELTO) 10 MG TABS tablet Take 1 tablet (10 mg total) by mouth daily with supper. 30 tablet 9  .  Skin Protectants, Misc. (EUCERIN) cream Apply 1 application topically 2 (two) times daily as needed for dry skin.     Marland Kitchen zinc oxide (BALMEX) 11.3 % CREA cream Apply 1 application topically 2 (two) times daily.     No current facility-administered medications for this visit.    REVIEW OF SYSTEMS:   Constitutional: ( - ) fevers, ( - )  chills , ( - ) night sweats Eyes: ( - ) blurriness of vision, ( - ) double vision, ( - ) watery eyes Ears, nose, mouth, throat, and face: ( - ) mucositis, ( - ) sore throat Respiratory: ( - ) cough, ( - ) dyspnea, ( - ) wheezes Cardiovascular: ( - ) palpitation, ( - ) chest discomfort, ( - ) lower extremity swelling Gastrointestinal:  ( - ) nausea, ( - ) heartburn, ( - ) change in bowel habits Skin: ( - ) abnormal skin rashes Lymphatics: ( - ) new lymphadenopathy, ( - ) easy bruising Neurological: ( - ) numbness, ( - ) tingling, ( - ) new weaknesses Behavioral/Psych: ( - ) mood change, ( - ) new changes  All other systems were reviewed  with the patient and are negative.  PHYSICAL EXAMINATION: ECOG PERFORMANCE STATUS: paraplegic.   Vitals:   08/18/20 0959  BP: 131/81  Pulse: (!) 112  Resp: 18  Temp: 97.8 F (36.6 C)  SpO2: 100%   Filed Weights   08/18/20 0959  Weight: 208 lb (94.3 kg)    GENERAL: well appearing middle aged [place] American male alert, no distress and comfortable SKIN: skin color, texture, turgor are normal, no rashes or significant lesions EYES: conjunctiva are pink and non-injected, sclera clear LUNGS: clear to auscultation and percussion with normal breathing effort HEART: regular rate & rhythm and no murmurs and no lower extremity edema Musculoskeletal: no cyanosis of digits and no clubbing  PSYCH: alert & oriented x 3, fluent speech NEURO: paraplegic, no use of LE bilaterally.   LABORATORY DATA:  I have reviewed the data as listed CBC Latest Ref Rng & Units 08/18/2020 07/22/2020 07/09/2020  WBC 4.0 - 10.5 K/uL 7.6 6.6 7.3  Hemoglobin 13.0 - 17.0 g/dL 12.8(L) 12.2(L) 12.4(L)  Hematocrit 39.0 - 52.0 % 37.5(L) 35.8(L) 36.1(L)  Platelets 150 - 400 K/uL 339 314 372    CMP Latest Ref Rng & Units 08/18/2020 07/22/2020 07/09/2020  Glucose 70 - 99 mg/dL 118(H) 113(H) 90  BUN 8 - 23 mg/dL 10 10 13   Creatinine 0.61 - 1.24 mg/dL 0.80 0.78 0.76  Sodium 135 - 145 mmol/L 145 142 142  Potassium 3.5 - 5.1 mmol/L 3.5 3.7 3.6  Chloride 98 - 111 mmol/L 107 107 107  CO2 22 - 32 mmol/L 28 29 26   Calcium 8.9 - 10.3 mg/dL 9.4 9.8 9.5  Total Protein 6.5 - 8.1 g/dL 6.9 6.9 7.1  Total Bilirubin 0.3 - 1.2 mg/dL 0.5 0.5 0.4  Alkaline Phos 38 - 126 U/L 83 82 87  AST 15 - 41 U/L 16 14(L) 13(L)  ALT 0 - 44 U/L 18 16 18     Lab Results  Component Value Date   MPROTEIN 0.3 (H) 07/22/2020   MPROTEIN 0.2 (H) 06/04/2020   MPROTEIN 0.2 (H) 04/30/2020   Lab Results  Component Value Date   KPAFRELGTCHN 7.2 07/22/2020   KPAFRELGTCHN 6.1 06/04/2020   KPAFRELGTCHN 6.0 04/30/2020   LAMBDASER 8.0 07/22/2020    LAMBDASER 6.8 06/04/2020   LAMBDASER 5.5 (L) 04/30/2020   KAPLAMBRATIO 0.90 07/22/2020  KAPLAMBRATIO 0.90 06/04/2020   KAPLAMBRATIO >3.69 05/07/2020    RADIOGRAPHIC STUDIES: XR FEMUR MIN 2 VIEWS LEFT  Result Date: 08/06/2020 2 view radiographs of the left femur shows small lytic lesion from the multiple myeloma without progression patient's cortical bone is stable.  XR FEMUR, MIN 2 VIEWS RIGHT  Result Date: 08/06/2020 2 view radiographs of the right femur shows punched-out lytic lesions involving the cortex of the femur but there is greater than 50% of cortical integrity with out any indication of pending pathologic fracture there has been no progression of the punched out lesions.   ASSESSMENT & PLAN [name] 65 y.o. male with medical history significant for  IgG Lambda Multiple Myeloma who presents for a follow up visit.  After review of the labs, discussion with the patient, and reviewed the imaging his findings are most consistent with a relapsed multiple myeloma.The patient has had excellent success before with treatment of his myeloma with Velcade, Revlimid, and dexamethasone.  On exam today [name] stable at his baseline level of health.  He has lost weight due to a religious fast he has been undertaking.  He is willing and able to proceed with treatment at this time   Treatment regimen consists of bortezomib 1.19m/m2 subq (Day 1,8, and 15), daratumumab 1800 mg subq (day 1,8 and 15), and dexamethasone 20 mg PO (day 1, 8 and 15). After 3 cycles, daratumumab to be administered q 3 weeks (from cycles 4-8). After cycle 8, continue daratumumab q 4 weeks alone (N Engl J Med 2016; [id number]  # IgG Lambda Multiple Myeloma, Relapsed (ISS Stage II) --findings are most consistent with relapsed multiple myeloma. Patient previously successfully treated with Velcade/Rev/Dex --current plan is Daratumumab/Velcade/Dex  (started 01/09/2020). Continue weekly velcade with q 3 week dara. On  07/02/2020 he transitioned to monthly daratumumab alone --recollect restaging MM labs monthly to include MM panel, SFLC. Additionally will collect LDH and beta 2 microglobulin.  --will have patient evaluated by dentistry prior to restarting Zometa therapy. He had an episode of osteonecrosis in the past, risk of restarting may outweight benefit.  --return for start of Cycle 12 on 09/17/2020.    #History of DVT --He had placement of IVC filter, remains on Xarelto. --Due to poor mobility, and lack of bleeding complications, I recommend he remain on Xarelto indefinitely. --caution if Plt count were to drop <50  # Supportive Care -- provided patient with an albuterol inhaler (for use with daratumumab) --acyclovir 4031mBID for VZV prophylaxis --zofran 55m55m8H PRN for nausea/vomiting  --Zometa can be considered after dental clearance, though with his prior episode of osteonecrosis the risk may outweigh the benefits. --patient recieved pretreatment RBC phenotype  No orders of the defined types were placed in this encounter.  All questions were answered. The patient knows to call the clinic with any problems, questions or concerns.  A total of more than 30 minutes were spent on this encounter and over half of that time was spent on counseling and coordination of care as outlined above.   JohLedell PeoplesD Department of Hematology/Oncology [facility]: [phone]: [phone]: [email or web address]  08/18/2020 2:31 PM   Literature Support:  PalArchie Endoeisel K, Nooka AK, Masszi T, Bek[place]fieldpi[place]part[place] HunHerriman Munder M, MatCrellinark TM, Qi M, Schecter J, AmiRock Valleyin X, Deraedt W, Ahmadi T, Spencer A, Sonneveld P; [employer] Investigators. Daratumumab, Bortezomib, and Dexamethasone for Multiple Myeloma.  [employer] Med. 2016 Aug 25;375(8):754-66  --among patients with relapsed or relapsed and refractory multiple  myeloma, daratumumab in combination with bortezomib and dexamethasone resulted in significantly longer progression-free survival than bortezomib and dexamethasone alone and was associated with infusion-related reactions and higher rates of thrombocytopenia and neutropenia than bortezomib and dexamethasone alone.

## 2020-08-18 ENCOUNTER — Other Ambulatory Visit: Payer: Self-pay

## 2020-08-18 ENCOUNTER — Inpatient Hospital Stay (HOSPITAL_BASED_OUTPATIENT_CLINIC_OR_DEPARTMENT_OTHER): Payer: Medicare (Managed Care) | Admitting: Hematology and Oncology

## 2020-08-18 ENCOUNTER — Inpatient Hospital Stay: Payer: Medicare (Managed Care) | Attending: Hematology and Oncology

## 2020-08-18 ENCOUNTER — Inpatient Hospital Stay: Payer: Medicare (Managed Care)

## 2020-08-18 ENCOUNTER — Encounter: Payer: Self-pay | Admitting: Hematology and Oncology

## 2020-08-18 VITALS — BP 131/81 | HR 112 | Temp 97.8°F | Resp 18 | Ht 68.0 in | Wt 208.0 lb

## 2020-08-18 DIAGNOSIS — Z7901 Long term (current) use of anticoagulants: Secondary | ICD-10-CM | POA: Diagnosis not present

## 2020-08-18 DIAGNOSIS — E119 Type 2 diabetes mellitus without complications: Secondary | ICD-10-CM | POA: Insufficient documentation

## 2020-08-18 DIAGNOSIS — Z79899 Other long term (current) drug therapy: Secondary | ICD-10-CM | POA: Diagnosis not present

## 2020-08-18 DIAGNOSIS — C9002 Multiple myeloma in relapse: Secondary | ICD-10-CM | POA: Insufficient documentation

## 2020-08-18 DIAGNOSIS — M87180 Osteonecrosis due to drugs, jaw: Secondary | ICD-10-CM

## 2020-08-18 DIAGNOSIS — Z7984 Long term (current) use of oral hypoglycemic drugs: Secondary | ICD-10-CM | POA: Diagnosis not present

## 2020-08-18 DIAGNOSIS — G8220 Paraplegia, unspecified: Secondary | ICD-10-CM | POA: Insufficient documentation

## 2020-08-18 DIAGNOSIS — Z5112 Encounter for antineoplastic immunotherapy: Secondary | ICD-10-CM | POA: Insufficient documentation

## 2020-08-18 DIAGNOSIS — Z7189 Other specified counseling: Secondary | ICD-10-CM

## 2020-08-18 DIAGNOSIS — Z5111 Encounter for antineoplastic chemotherapy: Secondary | ICD-10-CM

## 2020-08-18 DIAGNOSIS — Z86718 Personal history of other venous thrombosis and embolism: Secondary | ICD-10-CM | POA: Insufficient documentation

## 2020-08-18 LAB — CBC WITH DIFFERENTIAL (CANCER CENTER ONLY)
Abs Immature Granulocytes: 0.01 10*3/uL (ref 0.00–0.07)
Basophils Absolute: 0 10*3/uL (ref 0.0–0.1)
Basophils Relative: 1 %
Eosinophils Absolute: 0.5 10*3/uL (ref 0.0–0.5)
Eosinophils Relative: 7 %
HCT: 37.5 % — ABNORMAL LOW (ref 39.0–52.0)
Hemoglobin: 12.8 g/dL — ABNORMAL LOW (ref 13.0–17.0)
Immature Granulocytes: 0 %
Lymphocytes Relative: 14 %
Lymphs Abs: 1.1 10*3/uL (ref 0.7–4.0)
MCH: 27 pg (ref 26.0–34.0)
MCHC: 34.1 g/dL (ref 30.0–36.0)
MCV: 79.1 fL — ABNORMAL LOW (ref 80.0–100.0)
Monocytes Absolute: 0.4 10*3/uL (ref 0.1–1.0)
Monocytes Relative: 6 %
Neutro Abs: 5.5 10*3/uL (ref 1.7–7.7)
Neutrophils Relative %: 72 %
Platelet Count: 339 10*3/uL (ref 150–400)
RBC: 4.74 MIL/uL (ref 4.22–5.81)
RDW: 16.5 % — ABNORMAL HIGH (ref 11.5–15.5)
WBC Count: 7.6 10*3/uL (ref 4.0–10.5)
nRBC: 0 % (ref 0.0–0.2)

## 2020-08-18 LAB — CMP (CANCER CENTER ONLY)
ALT: 18 U/L (ref 0–44)
AST: 16 U/L (ref 15–41)
Albumin: 3.7 g/dL (ref 3.5–5.0)
Alkaline Phosphatase: 83 U/L (ref 38–126)
Anion gap: 10 (ref 5–15)
BUN: 10 mg/dL (ref 8–23)
CO2: 28 mmol/L (ref 22–32)
Calcium: 9.4 mg/dL (ref 8.9–10.3)
Chloride: 107 mmol/L (ref 98–111)
Creatinine: 0.8 mg/dL (ref 0.61–1.24)
GFR, Estimated: >60 mL/min (ref >60)
Glucose, Bld: 118 mg/dL — ABNORMAL HIGH (ref 70–99)
Potassium: 3.5 mmol/L (ref 3.5–5.1)
Sodium: 145 mmol/L (ref 135–145)
Total Bilirubin: 0.5 mg/dL (ref 0.3–1.2)
Total Protein: 6.9 g/dL (ref 6.5–8.1)

## 2020-08-18 LAB — LACTATE DEHYDROGENASE: LDH: 104 U/L (ref 98–192)

## 2020-08-18 MED ORDER — ACETAMINOPHEN 325 MG PO TABS
650.0000 mg | ORAL_TABLET | Freq: Once | ORAL | Status: AC
  Administered 2020-08-18: 650 mg via ORAL

## 2020-08-18 MED ORDER — ACYCLOVIR 400 MG PO TABS: 400.0000 mg | ORAL_TABLET | Freq: Two times a day (BID) | ORAL | 2 refills | Status: AC

## 2020-08-18 MED ORDER — DIPHENHYDRAMINE HCL 25 MG PO CAPS
ORAL_CAPSULE | ORAL | Status: AC
  Filled 2020-08-18: qty 1

## 2020-08-18 MED ORDER — DEXAMETHASONE 4 MG PO TABS
20.0000 mg | ORAL_TABLET | Freq: Once | ORAL | Status: AC
  Administered 2020-08-18: 20 mg via ORAL

## 2020-08-18 MED ORDER — DARATUMUMAB-HYALURONIDASE-FIHJ 1800-30000 MG-UT/15ML ~~LOC~~ SOLN
1800.0000 mg | Freq: Once | SUBCUTANEOUS | Status: AC
  Administered 2020-08-18: 1800 mg via SUBCUTANEOUS
  Filled 2020-08-18: qty 15

## 2020-08-18 MED ORDER — DEXAMETHASONE 4 MG PO TABS
ORAL_TABLET | ORAL | Status: AC
  Filled 2020-08-18: qty 5

## 2020-08-18 MED ORDER — DIPHENHYDRAMINE HCL 25 MG PO CAPS
25.0000 mg | ORAL_CAPSULE | Freq: Once | ORAL | Status: AC
  Administered 2020-08-18: 25 mg via ORAL

## 2020-08-18 MED ORDER — ACETAMINOPHEN 325 MG PO TABS
ORAL_TABLET | ORAL | Status: AC
  Filled 2020-08-18: qty 2

## 2020-08-18 NOTE — Patient Instructions (Signed)
[facility] Discharge Instructions for Patients Receiving Chemotherapy  Today you received the following chemotherapy agents Darzalex Injection  To help prevent nausea and vomiting after your treatment, we encourage you to take your nausea medication as directed.  If you develop nausea and vomiting that is not controlled by your nausea medication, call the clinic.   BELOW ARE SYMPTOMS THAT SHOULD BE REPORTED IMMEDIATELY:  *FEVER GREATER THAN 100.5 F  *CHILLS WITH OR WITHOUT FEVER  NAUSEA AND VOMITING THAT IS NOT CONTROLLED WITH YOUR NAUSEA MEDICATION  *UNUSUAL SHORTNESS OF BREATH  *UNUSUAL BRUISING OR BLEEDING  TENDERNESS IN MOUTH AND THROAT WITH OR WITHOUT PRESENCE OF ULCERS  *URINARY PROBLEMS  *BOWEL PROBLEMS  UNUSUAL RASH Items with * indicate a potential emergency and should be followed up as soon as possible.  Feel free to call the clinic should you have any questions or concerns. The clinic phone number is [phone].  Please show the [facility] at check-in to the Emergency Department and triage nurse.

## 2020-08-19 ENCOUNTER — Ambulatory Visit: Payer: Medicare (Managed Care) | Admitting: Hematology and Oncology

## 2020-08-19 ENCOUNTER — Ambulatory Visit: Payer: Medicare (Managed Care)

## 2020-08-19 ENCOUNTER — Other Ambulatory Visit: Payer: Medicare (Managed Care)

## 2020-08-19 LAB — KAPPA/LAMBDA LIGHT CHAINS
Kappa free light chain: 8.3 mg/L (ref 3.3–19.4)
Kappa, lambda light chain ratio: 1.04 (ref 0.26–1.65)
Lambda free light chains: 8 mg/L (ref 5.7–26.3)

## 2020-08-20 ENCOUNTER — Other Ambulatory Visit: Payer: Medicare (Managed Care)

## 2020-08-20 ENCOUNTER — Ambulatory Visit: Payer: Medicare (Managed Care)

## 2020-08-20 ENCOUNTER — Ambulatory Visit: Payer: Medicare (Managed Care) | Admitting: Hematology and Oncology

## 2020-08-20 LAB — MULTIPLE MYELOMA PANEL, SERUM
Albumin SerPl Elph-Mcnc: 3.4 g/dL (ref 2.9–4.4)
Albumin/Glob SerPl: 1.3 (ref 0.7–1.7)
Alpha 1: 0.2 g/dL (ref 0.0–0.4)
Alpha2 Glob SerPl Elph-Mcnc: 0.9 g/dL (ref 0.4–1.0)
B-Globulin SerPl Elph-Mcnc: 0.9 g/dL (ref 0.7–1.3)
Gamma Glob SerPl Elph-Mcnc: 0.7 g/dL (ref 0.4–1.8)
Globulin, Total: 2.7 g/dL (ref 2.2–3.9)
IgA: 36 mg/dL — ABNORMAL LOW (ref 61–437)
IgG (Immunoglobin G), Serum: 704 mg/dL (ref 603–1613)
IgM (Immunoglobulin M), Srm: 16 mg/dL — ABNORMAL LOW (ref 20–172)
M Protein SerPl Elph-Mcnc: 0.2 g/dL — ABNORMAL HIGH
Total Protein ELP: 6.1 g/dL (ref 6.0–8.5)

## 2020-08-24 ENCOUNTER — Encounter: Payer: Self-pay | Admitting: Internal Medicine

## 2020-08-25 ENCOUNTER — Telehealth: Payer: Self-pay | Admitting: *Deleted

## 2020-08-25 NOTE — Telephone Encounter (Signed)
-----   Message from Orson Slick, MD sent at 08/25/2020  9:19 AM EST ----- Please let Mr. Alvelo know that his labs are stable from prior. He has barely detectable levels of myeloma protein. We will continue our current treatment strategy.   ----- Message ----- From: Buel Ream, Lab In [place] Sent: 08/18/2020   9:59 AM EST To: Orson Slick, MD

## 2020-08-25 NOTE — Telephone Encounter (Signed)
TCT patient regarding recent lab results.  No answer but was able to leave vm message on pt's phone advising that his labs are stable and that the myeloma protein is barely detectable. Advised that we will continue our current treatment strategy. Advised to call [phone] with any questions or concerns.

## 2020-08-30 ENCOUNTER — Telehealth: Payer: Self-pay | Admitting: Hematology and Oncology

## 2020-08-30 NOTE — Telephone Encounter (Signed)
R/s appt per 2/4 sch msg - pt wife aware of new appt.

## 2020-08-31 ENCOUNTER — Telehealth: Payer: Self-pay | Admitting: Internal Medicine

## 2020-08-31 NOTE — Telephone Encounter (Signed)
[name] with [facility] called in reference of recall colon letter that we sent pt. She stated that pt is undergoing chemo therapy for multiple myeloma. Pt also had a partial colectomy in 2012 so she is wondering if pt still needs a repeat colon since he is under therapy. She stated that pt has multiple doctors appts so she is requesting if Dr. Henrene Pastor could advise on this without pt having to come for an ov. Pls call her.

## 2020-08-31 NOTE — Telephone Encounter (Signed)
Left message to call back  

## 2020-08-31 NOTE — Telephone Encounter (Signed)
Chart reviewed.  I am sorry to hear about his relapsing multiple myeloma for which he is under treatment.  I agree that colonoscopy at this point is not appropriate.  We will cancel plans for routine surveillance colonoscopy indefinitely. 

## 2020-08-31 NOTE — Telephone Encounter (Signed)
Spoke with Gari Crown NP with [facility] and she is aware of Dr. Blanch Media recommendations. She will notify Mr. Anello.

## 2020-08-31 NOTE — Telephone Encounter (Signed)
Brittney NP from [place] calling wanting to know if Dr. Henrene Pastor feels the pt really needs to come in to be seen. His recall letter came out for pt to come in for an OV to discuss recall colon. She states pt is [facility] seeing [facility]. She wondered if pt may perhaps hold off on this for now as he has a lot going on or does Dr. Henrene Pastor feel he needs to be seen. Please advise.

## 2020-09-23 ENCOUNTER — Ambulatory Visit: Payer: Medicare (Managed Care)

## 2020-09-23 ENCOUNTER — Other Ambulatory Visit: Payer: Medicare (Managed Care)

## 2020-09-23 ENCOUNTER — Other Ambulatory Visit: Payer: Self-pay

## 2020-09-23 ENCOUNTER — Inpatient Hospital Stay: Payer: Medicare (Managed Care) | Attending: Hematology and Oncology

## 2020-09-23 ENCOUNTER — Ambulatory Visit: Payer: Medicare (Managed Care) | Admitting: Hematology and Oncology

## 2020-09-23 ENCOUNTER — Inpatient Hospital Stay (HOSPITAL_BASED_OUTPATIENT_CLINIC_OR_DEPARTMENT_OTHER): Payer: Medicare (Managed Care) | Admitting: Hematology and Oncology

## 2020-09-23 ENCOUNTER — Inpatient Hospital Stay: Payer: Medicare (Managed Care)

## 2020-09-23 ENCOUNTER — Encounter: Payer: Self-pay | Admitting: Hematology and Oncology

## 2020-09-23 VITALS — BP 134/79 | HR 90 | Temp 96.4°F | Resp 13 | Ht 68.0 in

## 2020-09-23 DIAGNOSIS — M87180 Osteonecrosis due to drugs, jaw: Secondary | ICD-10-CM

## 2020-09-23 DIAGNOSIS — Z5111 Encounter for antineoplastic chemotherapy: Secondary | ICD-10-CM

## 2020-09-23 DIAGNOSIS — Z5112 Encounter for antineoplastic immunotherapy: Secondary | ICD-10-CM | POA: Insufficient documentation

## 2020-09-23 DIAGNOSIS — Z7984 Long term (current) use of oral hypoglycemic drugs: Secondary | ICD-10-CM | POA: Diagnosis not present

## 2020-09-23 DIAGNOSIS — D696 Thrombocytopenia, unspecified: Secondary | ICD-10-CM | POA: Diagnosis not present

## 2020-09-23 DIAGNOSIS — C9002 Multiple myeloma in relapse: Secondary | ICD-10-CM

## 2020-09-23 DIAGNOSIS — Z95828 Presence of other vascular implants and grafts: Secondary | ICD-10-CM

## 2020-09-23 DIAGNOSIS — Z7189 Other specified counseling: Secondary | ICD-10-CM

## 2020-09-23 DIAGNOSIS — E119 Type 2 diabetes mellitus without complications: Secondary | ICD-10-CM | POA: Diagnosis not present

## 2020-09-23 DIAGNOSIS — Z79899 Other long term (current) drug therapy: Secondary | ICD-10-CM | POA: Insufficient documentation

## 2020-09-23 DIAGNOSIS — Z86718 Personal history of other venous thrombosis and embolism: Secondary | ICD-10-CM | POA: Insufficient documentation

## 2020-09-23 DIAGNOSIS — Z7901 Long term (current) use of anticoagulants: Secondary | ICD-10-CM | POA: Insufficient documentation

## 2020-09-23 LAB — CBC WITH DIFFERENTIAL (CANCER CENTER ONLY)
Abs Immature Granulocytes: 0.02 10*3/uL (ref 0.00–0.07)
Basophils Absolute: 0.1 10*3/uL (ref 0.0–0.1)
Basophils Relative: 1 %
Eosinophils Absolute: 0.3 10*3/uL (ref 0.0–0.5)
Eosinophils Relative: 5 %
HCT: 36.1 % — ABNORMAL LOW (ref 39.0–52.0)
Hemoglobin: 12.8 g/dL — ABNORMAL LOW (ref 13.0–17.0)
Immature Granulocytes: 0 %
Lymphocytes Relative: 18 %
Lymphs Abs: 1.2 10*3/uL (ref 0.7–4.0)
MCH: 26.9 pg (ref 26.0–34.0)
MCHC: 35.5 g/dL (ref 30.0–36.0)
MCV: 75.8 fL — ABNORMAL LOW (ref 80.0–100.0)
Monocytes Absolute: 0.6 10*3/uL (ref 0.1–1.0)
Monocytes Relative: 9 %
Neutro Abs: 4.5 10*3/uL (ref 1.7–7.7)
Neutrophils Relative %: 67 %
Platelet Count: 381 10*3/uL (ref 150–400)
RBC: 4.76 MIL/uL (ref 4.22–5.81)
RDW: 16.3 % — ABNORMAL HIGH (ref 11.5–15.5)
WBC Count: 6.8 10*3/uL (ref 4.0–10.5)
nRBC: 0 % (ref 0.0–0.2)

## 2020-09-23 LAB — CMP (CANCER CENTER ONLY)
ALT: 18 U/L (ref 0–44)
AST: 16 U/L (ref 15–41)
Albumin: 4 g/dL (ref 3.5–5.0)
Alkaline Phosphatase: 97 U/L (ref 38–126)
Anion gap: 8 (ref 5–15)
BUN: 10 mg/dL (ref 8–23)
CO2: 24 mmol/L (ref 22–32)
Calcium: 9.5 mg/dL (ref 8.9–10.3)
Chloride: 107 mmol/L (ref 98–111)
Creatinine: 0.82 mg/dL (ref 0.61–1.24)
GFR, Estimated: >60 mL/min (ref >60)
Glucose, Bld: 101 mg/dL — ABNORMAL HIGH (ref 70–99)
Potassium: 4 mmol/L (ref 3.5–5.1)
Sodium: 139 mmol/L (ref 135–145)
Total Bilirubin: 0.4 mg/dL (ref 0.3–1.2)
Total Protein: 7.2 g/dL (ref 6.5–8.1)

## 2020-09-23 LAB — LACTATE DEHYDROGENASE: LDH: 106 U/L (ref 98–192)

## 2020-09-23 MED ORDER — DARATUMUMAB-HYALURONIDASE-FIHJ 1800-30000 MG-UT/15ML ~~LOC~~ SOLN
1800.0000 mg | Freq: Once | SUBCUTANEOUS | Status: AC
  Administered 2020-09-23: 1800 mg via SUBCUTANEOUS
  Filled 2020-09-23: qty 15

## 2020-09-23 MED ORDER — DIPHENHYDRAMINE HCL 25 MG PO CAPS
ORAL_CAPSULE | ORAL | Status: AC
  Filled 2020-09-23: qty 1

## 2020-09-23 MED ORDER — DIPHENHYDRAMINE HCL 25 MG PO CAPS
25.0000 mg | ORAL_CAPSULE | Freq: Once | ORAL | Status: AC
  Administered 2020-09-23: 25 mg via ORAL

## 2020-09-23 MED ORDER — DEXAMETHASONE 4 MG PO TABS
ORAL_TABLET | ORAL | Status: AC
  Filled 2020-09-23: qty 5

## 2020-09-23 MED ORDER — ACETAMINOPHEN 325 MG PO TABS
650.0000 mg | ORAL_TABLET | Freq: Once | ORAL | Status: AC
  Administered 2020-09-23: 650 mg via ORAL

## 2020-09-23 MED ORDER — SODIUM CHLORIDE 0.9% FLUSH
10.0000 mL | INTRAVENOUS | Status: AC | PRN
  Filled 2020-09-23: qty 10

## 2020-09-23 MED ORDER — DEXAMETHASONE 4 MG PO TABS
20.0000 mg | ORAL_TABLET | Freq: Once | ORAL | Status: AC
  Administered 2020-09-23: 20 mg via ORAL

## 2020-09-23 MED ORDER — ACETAMINOPHEN 325 MG PO TABS
ORAL_TABLET | ORAL | Status: AC
  Filled 2020-09-23: qty 2

## 2020-09-23 NOTE — Progress Notes (Signed)
[place] [phone]   [phone]  PROGRESS NOTE  Patient Care Team: [name], MD as PCP - General (Family Medicine) [name], MD as Consulting Physician (Physical Medicine and Rehabilitation) Donia Guiles Lavon Paganini, PA-C as Physician Assistant (Physical Medicine and Rehabilitation) [name], MD as Consulting Physician (Hematology and Oncology)  Hematological/Oncological History # IgG Lambda Multiple Myeloma, Relapsed (ISS Stage II) 1) 06/2010: initial diagnosis of Multiple Myeloma after T8 compression fracture. Treated with Velcade/Revlimid/Dexamethasone and achieved a complete remission 2) Velcade was discontinued in September 2012 and that Revlimid and Decadron were discontinued in March 2013. 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 4) Followed by Dr. Alvy Bimler, last clinic visit 10/09/2019. At that time there was concern for relapse of his multiple myeloma.  5) Patient requested transfer to different provider after misunderstanding regarding imaging studies 6) 12/17/2019: transfer care to Dr. Lorenso Courier  7) 01/09/2020: Cycle 1 Day 1 of Dara/Velcade/Dex 8) 01/21/2020: presented as urgent visit for diarrhea and dehydration. Holding chemotherapy scheduled for 01/23/2020. 9) 01/30/2020: Resume dara/velcade/dex after resolution of diarrhea.  10) 02/13/2020: restaging labs show M protein 0.8, Kappa 4.5, lamba 17.2, ratio 0.26, urine M protein 53 (7.1%). All MM labs indicate improvement.  11) 03/10/2020: Cycle 4 Day 1 of Dara/Velcade/Dex. Transition to q 3 week daratumumab.  12) 06/04/2020:  Cycle 8 Day 1 of Dara/Velcade/Dex 13) 06/25/2020:  Cycle 9 Day 1 of Dara/Velcade/Dex 14) 07/22/2020: Cycle 10 Day 1 of  Dara//Dex 15) 08/18/2020: Cycle 11 Day 1 of Dara//Dex 16) 09/23/2020: Cycle 12 Day 1 of Dara//Dex  Interval History:  [name] 65 y.o.  male with medical history significant for  IgG Lambda Multiple Myeloma who presents for a follow up visit. The patient's last visit was on 08/18/2020. In the interim he has had no changes in his health status.   On exam today [name] reports that he has been well in the interim since our last visit. He is little bit upset that transportation brought him on the wrong day, however we were able to accommodate him in the chemotherapy room in clinic without difficulty. He reports that he has been tolerating the treatment well and from multiple myeloma standpoint has had no new issues arise. He reports that he is currently tolerating Xarelto therapy well without any bleeding or bruising. He reports that his urine and ostomy outputs have both been stable and normal. He denies have any numbness or tingling of his fingers or hands. He denies any fevers, chills, sweats, nausea, or diarrhea. A full 10 point ROS is listed below  MEDICAL HISTORY:  Past Medical History:  Diagnosis Date  . Adrenal insufficiency (HCC)    on chronic dexamethasone  . Anemia   . Cancer ([place])   . Coagulopathy ([place])    on xeralto/ s/p DVT while on coumadin,  IVC in place  . Diabetes mellitus without complication ([place])    type 2  . Gross hematuria 7/14   post foley cath procedure  . History of blood transfusion 7/14  . Multiple myeloma    thoracic T8 with paraplegia s/p resection- on chemo at visit 10/13/10  . Multiple myeloma   . Multiple myeloma without mention of remission   . Neurogenic bladder   . Neurogenic bowel   . Paraplegia ([place])   . Partial small bowel obstruction ([place]) during dec 2011 admission    SURGICAL HISTORY: Past  Surgical History:  Procedure Laterality Date  . COLONOSCOPY WITH PROPOFOL N/A 04/12/2017   Procedure: COLONOSCOPY WITH PROPOFOL;  Surgeon: Irene Shipper, MD;  Location: [facility];  Service: Endoscopy;  Laterality: N/A;  . COLONOSCOPY WITH PROPOFOL N/A 04/19/2017   Procedure: COLONOSCOPY WITH  PROPOFOL;  Surgeon: Yetta Flock, MD;  Location: [facility];  Service: Gastroenterology;  Laterality: N/A;  . COLOSTOMY  07/20/2011   Procedure: COLOSTOMY;  Surgeon: Judieth Keens, DO;  Location: [facility];  Service: General;;  . COLOSTOMY REVISION  07/20/2011   Procedure: COLON RESECTION SIGMOID;  Surgeon: Judieth Keens, DO;  Location: [facility];  Service: General;;  . CYSTOSCOPY N/A 04/04/2013   Procedure: CYSTOSCOPY WITH LITHALOPAXY;  Surgeon: Alexis Frock, MD;  Location: [facility];  Service: Urology;  Laterality: N/A;  . INSERTION OF SUPRAPUBIC CATHETER N/A 04/04/2013   Procedure: INSERTION OF SUPRAPUBIC CATHETER;  Surgeon: Alexis Frock, MD;  Location: [facility];  Service: Urology;  Laterality: N/A;  . LAPAROTOMY  07/20/2011   Procedure: EXPLORATORY LAPAROTOMY;  Surgeon: Judieth Keens, DO;  Location: [facility];  Service: General;  Laterality: N/A;  . myeloma thoracic T8 with parpaplegia s/p thoracotomy and thoracic T7-9 cage placement on Dec 26th 2011  07/18/10    SOCIAL HISTORY: Social History   Socioeconomic History  . Marital status: Married    Spouse name: Not on file  . Number of children: Not on file  . Years of education: Not on file  . Highest education level: Not on file  Occupational History  . Not on file  Tobacco Use  . Smoking status: Never Smoker  . Smokeless tobacco: Never Used  Vaping Use  . Vaping Use: Never used  Substance and Sexual Activity  . Alcohol use: No  . Drug use: No  . Sexual activity: Never  Other Topics Concern  . Not on file  Social History Narrative  . Not on file   Social Determinants of Health   Financial Resource Strain: Not on file  Food Insecurity: Not on file  Transportation Needs: Not on file  Physical Activity: Not on file  Stress: Not on file  Social Connections: Not on file  Intimate Partner Violence: Not on file    FAMILY HISTORY: Family History  Problem Relation Age of Onset  . Ovarian cancer Mother   .  Diabetes Father     ALLERGIES:  is allergic to feraheme [ferumoxytol].  MEDICATIONS:  Current Outpatient Medications  Medication Sig Dispense Refill  . acyclovir (ZOVIRAX) 400 MG tablet Take 1 tablet (400 mg total) by mouth 2 (two) times daily. 60 tablet 2  . atorvastatin (LIPITOR) 10 MG tablet Take 10 mg by mouth daily.    . baclofen (LIORESAL) 20 MG tablet Take 20 mg by mouth 2 (two) times daily.    . diazepam (DIASTAT ACUDIAL) 10 MG GEL SMARTSIG:By Mouth    . dorzolamide (TRUSOPT) 2 % ophthalmic solution 1 drop 2 (two) times daily.    . iron polysaccharides (NU-IRON) 150 MG capsule Take 1 capsule (150 mg total) by mouth daily. 30 capsule 0  . latanoprost (XALATAN) 0.005 % ophthalmic solution Place 1 drop into both eyes at bedtime.    . metFORMIN (GLUCOPHAGE) 500 MG tablet Take 500 mg by mouth 2 (two) times daily with a meal.    . ondansetron (ZOFRAN) 8 MG tablet Take 1 tablet (8 mg total) by mouth every 8 (eight) hours as needed for nausea or vomiting. 30 tablet 0  .  rivaroxaban (XARELTO) 10 MG TABS tablet Take 1 tablet (10 mg total) by mouth daily with supper. 30 tablet 9  . Skin Protectants, Misc. (EUCERIN) cream Apply 1 application topically 2 (two) times daily as needed for dry skin.     Marland Kitchen zinc oxide (BALMEX) 11.3 % CREA cream Apply 1 application topically 2 (two) times daily.     No current facility-administered medications for this visit.   Facility-Administered Medications Ordered in Other Visits  Medication Dose Route Frequency Provider Last Rate Last Admin  . sodium chloride flush (NS) 0.9 % injection 10 mL  10 mL Intravenous PRN Alvy Bimler, Ni, MD        REVIEW OF SYSTEMS:   Constitutional: ( - ) fevers, ( - )  chills , ( - ) night sweats Eyes: ( - ) blurriness of vision, ( - ) double vision, ( - ) watery eyes Ears, nose, mouth, throat, and face: ( - ) mucositis, ( - ) sore throat Respiratory: ( - ) cough, ( - ) dyspnea, ( - ) wheezes Cardiovascular: ( - ) palpitation, ( -  ) chest discomfort, ( - ) lower extremity swelling Gastrointestinal:  ( - ) nausea, ( - ) heartburn, ( - ) change in bowel habits Skin: ( - ) abnormal skin rashes Lymphatics: ( - ) new lymphadenopathy, ( - ) easy bruising Neurological: ( - ) numbness, ( - ) tingling, ( - ) new weaknesses Behavioral/Psych: ( - ) mood change, ( - ) new changes  All other systems were reviewed with the patient and are negative.  PHYSICAL EXAMINATION: ECOG PERFORMANCE STATUS: paraplegic.   Vitals:   09/23/20 1158  BP: 134/79  Pulse: 90  Resp: 13  Temp: (!) 96.4 F (35.8 C)  SpO2: 100%   There were no vitals filed for this visit.  GENERAL: well appearing middle aged [place] American male alert, no distress and comfortable SKIN: skin color, texture, turgor are normal, no rashes or significant lesions EYES: conjunctiva are pink and non-injected, sclera clear LUNGS: clear to auscultation and percussion with normal breathing effort HEART: regular rate & rhythm and no murmurs and no lower extremity edema Musculoskeletal: no cyanosis of digits and no clubbing  PSYCH: alert & oriented x 3, fluent speech NEURO: paraplegic, no use of LE bilaterally.   LABORATORY DATA:  I have reviewed the data as listed CBC Latest Ref Rng & Units 09/23/2020 08/18/2020 07/22/2020  WBC 4.0 - 10.5 K/uL 6.8 7.6 6.6  Hemoglobin 13.0 - 17.0 g/dL 12.8(L) 12.8(L) 12.2(L)  Hematocrit 39.0 - 52.0 % 36.1(L) 37.5(L) 35.8(L)  Platelets 150 - 400 K/uL 381 339 314    CMP Latest Ref Rng & Units 09/23/2020 08/18/2020 07/22/2020  Glucose 70 - 99 mg/dL 101(H) 118(H) 113(H)  BUN 8 - 23 mg/dL 10 10 10   Creatinine 0.61 - 1.24 mg/dL 0.82 0.80 0.78  Sodium 135 - 145 mmol/L 139 145 142  Potassium 3.5 - 5.1 mmol/L 4.0 3.5 3.7  Chloride 98 - 111 mmol/L 107 107 107  CO2 22 - 32 mmol/L 24 28 29   Calcium 8.9 - 10.3 mg/dL 9.5 9.4 9.8  Total Protein 6.5 - 8.1 g/dL 7.2 6.9 6.9  Total Bilirubin 0.3 - 1.2 mg/dL 0.4 0.5 0.5  Alkaline Phos 38 - 126 U/L 97  83 82  AST 15 - 41 U/L 16 16 14(L)  ALT 0 - 44 U/L 18 18 16     Lab Results  Component Value Date   MPROTEIN 0.2 (H) 08/18/2020  MPROTEIN 0.3 (H) 07/22/2020   MPROTEIN 0.2 (H) 06/04/2020   Lab Results  Component Value Date   KPAFRELGTCHN 8.3 08/18/2020   KPAFRELGTCHN 7.2 07/22/2020   KPAFRELGTCHN 6.1 06/04/2020   LAMBDASER 8.0 08/18/2020   LAMBDASER 8.0 07/22/2020   LAMBDASER 6.8 06/04/2020   KAPLAMBRATIO 1.04 08/18/2020   KAPLAMBRATIO 0.90 07/22/2020   KAPLAMBRATIO 0.90 06/04/2020    RADIOGRAPHIC STUDIES: No results found.  ASSESSMENT & PLAN LYSANDER CALIXTE 65 y.o. male with medical history significant for  IgG Lambda Multiple Myeloma who presents for a follow up visit.  After review of the labs, discussion with the patient, and reviewed the imaging his findings are most consistent with a relapsed multiple myeloma.The patient has had excellent success before with treatment of his myeloma with Velcade, Revlimid, and dexamethasone.  On exam today [name] stable at his baseline level of health.   He is willing and able to proceed with treatment at this time   Treatment regimen consists of bortezomib 1.74m/m2 subq (Day 1,8, and 15), daratumumab 1800 mg subq (day 1,8 and 15), and dexamethasone 20 mg PO (day 1, 8 and 15). After 3 cycles, daratumumab to be administered q 3 weeks (from cycles 4-8). After cycle 8, continue daratumumab q 4 weeks alone (N Engl J Med 2016; [id number]  # IgG Lambda Multiple Myeloma, Relapsed (ISS Stage II) --findings are most consistent with relapsed multiple myeloma. Patient previously successfully treated with Velcade/Rev/Dex --current plan is Daratumumab/Velcade/Dex  (started 01/09/2020). On 07/02/2020 he transitioned to monthly daratumumab alone. Continue q 4 week dara. --recollect restaging MM labs monthly to include MM panel, SFLC. Additionally will collect LDH and beta 2 microglobulin.  --will have patient evaluated by dentistry prior to  restarting Zometa therapy. He had an episode of osteonecrosis in the past, risk of restarting may outweight benefit.  --return for start of Cycle 13 on 10/22/2020.    #History of DVT --He had placement of IVC filter, remains on Xarelto. --Due to poor mobility, and lack of bleeding complications, I recommend he remain on Xarelto indefinitely. --caution if Plt count were to drop <50  # Supportive Care -- provided patient with an albuterol inhaler (for use with daratumumab) --acyclovir 4077mBID for VZV prophylaxis --zofran 16m39m8H PRN for nausea/vomiting  --Zometa can be considered after dental clearance, though with his prior episode of osteonecrosis the risk may outweigh the benefits. --patient recieved pretreatment RBC phenotype  No orders of the defined types were placed in this encounter.  All questions were answered. The patient knows to call the clinic with any problems, questions or concerns.  A total of more than 30 minutes were spent on this encounter and over half of that time was spent on counseling and coordination of care as outlined above.   JohLedell PeoplesD Department of Hematology/Oncology ConL[facility] WesArn[facility] For Childrenone: [phone]: [phone]: [email or web address]  09/23/2020 8:01 PM   Literature Support:  PalArchie Endoeisel K, Nooka AK, Masszi T, BekRotanpiWest Point HunJud Munder M, MatRussiavilleark TM, Qi M, Schecter J, AmiCarbonin X, Deraedt W, Ahmadi T, Spencer A, Sonneveld P; [employer]ST[employer] Investigators. Daratumumab, Bortezomib, and Dexamethasone for Multiple Myeloma. N EAlta Corningd. 2016 Aug 25;375(8):754-66  --among patients with relapsed or relapsed and refractory multiple myeloma, daratumumab in combination with bortezomib and dexamethasone resulted in significantly longer progression-free survival than bortezomib and dexamethasone alone and was associated with infusion-related reactions and higher rates  of thrombocytopenia and neutropenia than bortezomib and dexamethasone alone.

## 2020-09-23 NOTE — Patient Instructions (Signed)
[facility] Discharge Instructions for Patients Receiving Chemotherapy  Today you received the following chemotherapy agents: Darzalex Faspro  To help prevent nausea and vomiting after your treatment, we encourage you to take your nausea medication  as prescribed.    If you develop nausea and vomiting that is not controlled by your nausea medication, call the clinic.   BELOW ARE SYMPTOMS THAT SHOULD BE REPORTED IMMEDIATELY:  *FEVER GREATER THAN 100.5 F  *CHILLS WITH OR WITHOUT FEVER  NAUSEA AND VOMITING THAT IS NOT CONTROLLED WITH YOUR NAUSEA MEDICATION  *UNUSUAL SHORTNESS OF BREATH  *UNUSUAL BRUISING OR BLEEDING  TENDERNESS IN MOUTH AND THROAT WITH OR WITHOUT PRESENCE OF ULCERS  *URINARY PROBLEMS  *BOWEL PROBLEMS  UNUSUAL RASH Items with * indicate a potential emergency and should be followed up as soon as possible.  Feel free to call the clinic should you have any questions or concerns. The clinic phone number is [phone].  Please show the [facility] at check-in to the Emergency Department and triage nurse.

## 2020-09-23 NOTE — Patient Instructions (Signed)
Implanted Port Insertion, Care After This sheet gives you information about how to care for yourself after your procedure. Your health care provider may also give you more specific instructions. If you have problems or questions, contact your health care provider. What can I expect after the procedure? After the procedure, it is common to have:  Discomfort at the port insertion site.  Bruising on the skin over the port. This should improve over 3-4 days. Follow these instructions at home: Port care  After your port is placed, you will get a manufacturer's information card. The card has information about your port. Keep this card with you at all times.  Take care of the port as told by your health care provider. Ask your health care provider if you or a family member can get training for taking care of the port at home. A home health care nurse may also take care of the port.  Make sure to remember what type of port you have. Incision care  Follow instructions from your health care provider about how to take care of your port insertion site. Make sure you: ? Wash your hands with soap and water before and after you change your bandage (dressing). If soap and water are not available, use hand sanitizer. ? Change your dressing as told by your health care provider. ? Leave stitches (sutures), skin glue, or adhesive strips in place. These skin closures may need to stay in place for 2 weeks or longer. If adhesive strip edges start to loosen and curl up, you may trim the loose edges. Do not remove adhesive strips completely unless your health care provider tells you to do that.  Check your port insertion site every day for signs of infection. Check for: ? Redness, swelling, or pain. ? Fluid or blood. ? Warmth. ? Pus or a bad smell.      Activity  Return to your normal activities as told by your health care provider. Ask your health care provider what activities are safe for you.  Do not  lift anything that is heavier than 10 lb (4.5 kg), or the limit that you are told, until your health care provider says that it is safe. General instructions  Take over-the-counter and prescription medicines only as told by your health care provider.  Do not take baths, swim, or use a hot tub until your health care provider approves. Ask your health care provider if you may take showers. You may only be allowed to take sponge baths.  Do not drive for 24 hours if you were given a sedative during your procedure.  Wear a medical alert bracelet in case of an emergency. This will tell any health care providers that you have a port.  Keep all follow-up visits as told by your health care provider. This is important. Contact a health care provider if:  You cannot flush your port with saline as directed, or you cannot draw blood from the port.  You have a fever or chills.  You have redness, swelling, or pain around your port insertion site.  You have fluid or blood coming from your port insertion site.  Your port insertion site feels warm to the touch.  You have pus or a bad smell coming from the port insertion site. Get help right away if:  You have chest pain or shortness of breath.  You have bleeding from your port that you cannot control. Summary  Take care of the port as told by your   health care provider. Keep the manufacturer's information card with you at all times.  Change your dressing as told by your health care provider.  Contact a health care provider if you have a fever or chills or if you have redness, swelling, or pain around your port insertion site.  Keep all follow-up visits as told by your health care provider. This information is not intended to replace advice given to you by your health care provider. Make sure you discuss any questions you have with your health care provider. Document Revised: 02/05/2018 Document Reviewed: 02/05/2018 Elsevier Patient Education   2021 Elsevier Inc.  

## 2020-09-24 ENCOUNTER — Inpatient Hospital Stay: Payer: Medicare (Managed Care)

## 2020-09-24 ENCOUNTER — Inpatient Hospital Stay: Payer: Medicare (Managed Care) | Admitting: Hematology and Oncology

## 2020-09-24 LAB — KAPPA/LAMBDA LIGHT CHAINS
Kappa free light chain: 6.8 mg/L (ref 3.3–19.4)
Kappa, lambda light chain ratio: 1.15 (ref 0.26–1.65)
Lambda free light chains: 5.9 mg/L (ref 5.7–26.3)

## 2020-09-28 LAB — MULTIPLE MYELOMA PANEL, SERUM
Albumin SerPl Elph-Mcnc: 3.8 g/dL (ref 2.9–4.4)
Albumin/Glob SerPl: 1.3 (ref 0.7–1.7)
Alpha 1: 0.3 g/dL (ref 0.0–0.4)
Alpha2 Glob SerPl Elph-Mcnc: 1.1 g/dL — ABNORMAL HIGH (ref 0.4–1.0)
B-Globulin SerPl Elph-Mcnc: 1 g/dL (ref 0.7–1.3)
Gamma Glob SerPl Elph-Mcnc: 0.6 g/dL (ref 0.4–1.8)
Globulin, Total: 3 g/dL (ref 2.2–3.9)
IgA: 42 mg/dL — ABNORMAL LOW (ref 61–437)
IgG (Immunoglobin G), Serum: 750 mg/dL (ref 603–1613)
IgM (Immunoglobulin M), Srm: 22 mg/dL (ref 20–172)
M Protein SerPl Elph-Mcnc: 0.3 g/dL — ABNORMAL HIGH
Total Protein ELP: 6.8 g/dL (ref 6.0–8.5)

## 2020-10-22 ENCOUNTER — Inpatient Hospital Stay (HOSPITAL_BASED_OUTPATIENT_CLINIC_OR_DEPARTMENT_OTHER): Payer: Medicare (Managed Care) | Admitting: Hematology and Oncology

## 2020-10-22 ENCOUNTER — Encounter: Payer: Self-pay | Admitting: Hematology and Oncology

## 2020-10-22 ENCOUNTER — Other Ambulatory Visit: Payer: Medicare (Managed Care)

## 2020-10-22 ENCOUNTER — Inpatient Hospital Stay: Payer: Medicare (Managed Care) | Attending: Hematology and Oncology

## 2020-10-22 ENCOUNTER — Other Ambulatory Visit: Payer: Self-pay

## 2020-10-22 ENCOUNTER — Inpatient Hospital Stay: Payer: Medicare (Managed Care)

## 2020-10-22 VITALS — BP 136/77 | HR 90 | Temp 97.9°F | Resp 16 | Ht 68.0 in | Wt 216.8 lb

## 2020-10-22 DIAGNOSIS — Z7901 Long term (current) use of anticoagulants: Secondary | ICD-10-CM | POA: Diagnosis not present

## 2020-10-22 DIAGNOSIS — C9002 Multiple myeloma in relapse: Secondary | ICD-10-CM | POA: Diagnosis present

## 2020-10-22 DIAGNOSIS — Z86718 Personal history of other venous thrombosis and embolism: Secondary | ICD-10-CM | POA: Diagnosis not present

## 2020-10-22 DIAGNOSIS — Z5112 Encounter for antineoplastic immunotherapy: Secondary | ICD-10-CM | POA: Insufficient documentation

## 2020-10-22 DIAGNOSIS — D649 Anemia, unspecified: Secondary | ICD-10-CM | POA: Diagnosis not present

## 2020-10-22 DIAGNOSIS — Z7189 Other specified counseling: Secondary | ICD-10-CM

## 2020-10-22 LAB — CBC WITH DIFFERENTIAL (CANCER CENTER ONLY)
Abs Immature Granulocytes: 0.03 10*3/uL (ref 0.00–0.07)
Basophils Absolute: 0 10*3/uL (ref 0.0–0.1)
Basophils Relative: 1 %
Eosinophils Absolute: 0.4 10*3/uL (ref 0.0–0.5)
Eosinophils Relative: 6 %
HCT: 36.7 % — ABNORMAL LOW (ref 39.0–52.0)
Hemoglobin: 12.6 g/dL — ABNORMAL LOW (ref 13.0–17.0)
Immature Granulocytes: 0 %
Lymphocytes Relative: 15 %
Lymphs Abs: 1.1 10*3/uL (ref 0.7–4.0)
MCH: 26.8 pg (ref 26.0–34.0)
MCHC: 34.3 g/dL (ref 30.0–36.0)
MCV: 78.1 fL — ABNORMAL LOW (ref 80.0–100.0)
Monocytes Absolute: 0.5 10*3/uL (ref 0.1–1.0)
Monocytes Relative: 7 %
Neutro Abs: 5.3 10*3/uL (ref 1.7–7.7)
Neutrophils Relative %: 71 %
Platelet Count: 356 10*3/uL (ref 150–400)
RBC: 4.7 MIL/uL (ref 4.22–5.81)
RDW: 16.1 % — ABNORMAL HIGH (ref 11.5–15.5)
WBC Count: 7.4 10*3/uL (ref 4.0–10.5)
nRBC: 0 % (ref 0.0–0.2)

## 2020-10-22 LAB — CMP (CANCER CENTER ONLY)
ALT: 33 U/L (ref 0–44)
AST: 22 U/L (ref 15–41)
Albumin: 3.5 g/dL (ref 3.5–5.0)
Alkaline Phosphatase: 105 U/L (ref 38–126)
Anion gap: 12 (ref 5–15)
BUN: 8 mg/dL (ref 8–23)
CO2: 26 mmol/L (ref 22–32)
Calcium: 8.9 mg/dL (ref 8.9–10.3)
Chloride: 107 mmol/L (ref 98–111)
Creatinine: 0.74 mg/dL (ref 0.61–1.24)
GFR, Estimated: >60 mL/min (ref >60)
Glucose, Bld: 107 mg/dL — ABNORMAL HIGH (ref 70–99)
Potassium: 3.6 mmol/L (ref 3.5–5.1)
Sodium: 145 mmol/L (ref 135–145)
Total Bilirubin: 0.5 mg/dL (ref 0.3–1.2)
Total Protein: 6.5 g/dL (ref 6.5–8.1)

## 2020-10-22 LAB — LACTATE DEHYDROGENASE: LDH: 117 U/L (ref 98–192)

## 2020-10-22 MED ORDER — ACETAMINOPHEN 325 MG PO TABS
650.0000 mg | ORAL_TABLET | Freq: Once | ORAL | Status: AC
  Administered 2020-10-22: 650 mg via ORAL

## 2020-10-22 MED ORDER — DIPHENHYDRAMINE HCL 25 MG PO CAPS
ORAL_CAPSULE | ORAL | Status: AC
  Filled 2020-10-22: qty 1

## 2020-10-22 MED ORDER — DIPHENHYDRAMINE HCL 25 MG PO CAPS
25.0000 mg | ORAL_CAPSULE | Freq: Once | ORAL | Status: AC
  Administered 2020-10-22: 25 mg via ORAL

## 2020-10-22 MED ORDER — ACETAMINOPHEN 325 MG PO TABS
ORAL_TABLET | ORAL | Status: AC
  Filled 2020-10-22: qty 2

## 2020-10-22 MED ORDER — DARATUMUMAB-HYALURONIDASE-FIHJ 1800-30000 MG-UT/15ML ~~LOC~~ SOLN
1800.0000 mg | Freq: Once | SUBCUTANEOUS | Status: AC
  Administered 2020-10-22: 1800 mg via SUBCUTANEOUS
  Filled 2020-10-22: qty 15

## 2020-10-22 MED ORDER — DEXAMETHASONE 4 MG PO TABS
20.0000 mg | ORAL_TABLET | Freq: Once | ORAL | Status: AC
  Administered 2020-10-22: 20 mg via ORAL

## 2020-10-22 MED ORDER — DEXAMETHASONE 4 MG PO TABS
ORAL_TABLET | ORAL | Status: AC
  Filled 2020-10-22: qty 5

## 2020-10-22 NOTE — Progress Notes (Signed)
[facility] [phone]   [phone]  PROGRESS NOTE  Patient Care Team: [name], MD as PCP - General (Family Medicine) [name], MD as Consulting Physician (Physical Medicine and Rehabilitation) Donia Guiles Lavon Paganini, PA-C as Physician Assistant (Physical Medicine and Rehabilitation) [name], MD as Consulting Physician (Hematology and Oncology)  Hematological/Oncological History # IgG Lambda Multiple Myeloma, Relapsed (ISS Stage II) 1) 06/2010: initial diagnosis of Multiple Myeloma after T8 compression fracture. Treated with Velcade/Revlimid/Dexamethasone and achieved a complete remission 2) Velcade was discontinued in September 2012 and that Revlimid and Decadron were discontinued in March 2013. 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 4) Followed by Dr. Alvy Bimler, last clinic visit 10/09/2019. At that time there was concern for relapse of his multiple myeloma.  5) Patient requested transfer to different provider after misunderstanding regarding imaging studies 6) 12/17/2019: transfer care to Dr. Lorenso Courier  7) 01/09/2020: Cycle 1 Day 1 of Dara/Velcade/Dex 8) 01/21/2020: presented as urgent visit for diarrhea and dehydration. Holding chemotherapy scheduled for 01/23/2020. 9) 01/30/2020: Resume dara/velcade/dex after resolution of diarrhea.  10) 02/13/2020: restaging labs show M protein 0.8, Kappa 4.5, lamba 17.2, ratio 0.26, urine M protein 53 (7.1%). All MM labs indicate improvement.  11) 03/10/2020: Cycle 4 Day 1 of Dara/Velcade/Dex. Transition to q 3 week daratumumab.  12) 06/04/2020:  Cycle 8 Day 1 of Dara/Velcade/Dex 13) 06/25/2020:  Cycle 9 Day 1 of Dara/Velcade/Dex 14) 07/22/2020: Cycle 10 Day 1 of  Dara//Dex 15) 08/18/2020: Cycle 11 Day 1 of Dara//Dex 16) 09/23/2020: Cycle 12 Day 1 of Dara//Dex 17) 10/22/2020: Cycle 13 Day 1 of Dara/Dex    Interval History:  [name] 65 y.o. male with medical history significant for  IgG Lambda Multiple Myeloma who presents for a follow up visit. The patient's last visit was on 09/23/2020. In the interim he has had no changes in his health status.   On exam today [name] reports that he has been well in the interim since our last visit. He continues to tolerate the treatment well and from multiple myeloma standpoint has had no new issues arise. His energy levels are stable he is able to complete his normal activities on his own. He denies any nausea, vomiting or abdominal pain. He reports that his urine and ostomy outputs have both been stable and normal. He denies have any numbness or tingling of his fingers or hands. He denies any fevers, chills, sweats, shortness of breath, chest pain or cough. He has no other complaints. A full 10 point ROS is listed below  MEDICAL HISTORY:  Past Medical History:  Diagnosis Date  . Adrenal insufficiency (HCC)    on chronic dexamethasone  . Anemia   . Cancer ([place])   . Coagulopathy ([place])    on xeralto/ s/p DVT while on coumadin,  IVC in place  . Diabetes mellitus without complication ([place])    type 2  . Gross hematuria 7/14   post foley cath procedure  . History of blood transfusion 7/14  . Multiple myeloma    thoracic T8 with paraplegia s/p resection- on chemo at visit 10/13/10  . Multiple myeloma   . Multiple myeloma without mention of remission   . Neurogenic bladder   . Neurogenic bowel   . Paraplegia ([place])   . Partial small bowel obstruction ([place]) during dec 2011 admission    SURGICAL HISTORY: Past Surgical History:  Procedure  Laterality Date  . COLONOSCOPY WITH PROPOFOL N/A 04/12/2017   Procedure: COLONOSCOPY WITH PROPOFOL;  Surgeon: Irene Shipper, MD;  [facility];  Service: Endoscopy;  Laterality: N/A;  . COLONOSCOPY WITH PROPOFOL N/A 04/19/2017   Procedure: COLONOSCOPY WITH PROPOFOL;  Surgeon: Yetta Flock, MD;   [facility];  Service: Gastroenterology;  Laterality: N/A;  . COLOSTOMY  07/20/2011   Procedure: COLOSTOMY;  Surgeon: Judieth Keens, DO;  Location: [facility];  Service: General;;  . COLOSTOMY REVISION  07/20/2011   Procedure: COLON RESECTION SIGMOID;  Surgeon: Judieth Keens, DO;  Location: [facility];  Service: General;;  . CYSTOSCOPY N/A 04/04/2013   Procedure: CYSTOSCOPY WITH LITHALOPAXY;  Surgeon: Alexis Frock, MD;  [facility];  Service: Urology;  Laterality: N/A;  . INSERTION OF SUPRAPUBIC CATHETER N/A 04/04/2013   Procedure: INSERTION OF SUPRAPUBIC CATHETER;  Surgeon: Alexis Frock, MD;  [facility];  Service: Urology;  Laterality: N/A;  . LAPAROTOMY  07/20/2011   Procedure: EXPLORATORY LAPAROTOMY;  Surgeon: Judieth Keens, DO;  Location: [facility];  Service: General;  Laterality: N/A;  . myeloma thoracic T8 with parpaplegia s/p thoracotomy and thoracic T7-9 cage placement on Dec 26th 2011  07/18/10    SOCIAL HISTORY: Social History   Socioeconomic History  . Marital status: Married    Spouse name: Not on file  . Number of children: Not on file  . Years of education: Not on file  . Highest education level: Not on file  Occupational History  . Not on file  Tobacco Use  . Smoking status: Never Smoker  . Smokeless tobacco: Never Used  Vaping Use  . Vaping Use: Never used  Substance and Sexual Activity  . Alcohol use: No  . Drug use: No  . Sexual activity: Never  Other Topics Concern  . Not on file  Social History Narrative  . Not on file   Social Determinants of Health   Financial Resource Strain: Not on file  Food Insecurity: Not on file  Transportation Needs: Not on file  Physical Activity: Not on file  Stress: Not on file  Social Connections: Not on file  Intimate Partner Violence: Not on file    FAMILY HISTORY: Family History  Problem Relation Age of Onset  . Ovarian cancer Mother   . Diabetes Father     ALLERGIES:  is allergic to  feraheme [ferumoxytol].  MEDICATIONS:  Current Outpatient Medications  Medication Sig Dispense Refill  . acyclovir (ZOVIRAX) 400 MG tablet Take 1 tablet (400 mg total) by mouth 2 (two) times daily. 60 tablet 2  . atorvastatin (LIPITOR) 10 MG tablet Take 10 mg by mouth daily.    . baclofen (LIORESAL) 20 MG tablet Take 20 mg by mouth 2 (two) times daily.    . diazepam (DIASTAT ACUDIAL) 10 MG GEL SMARTSIG:By Mouth    . dorzolamide (TRUSOPT) 2 % ophthalmic solution 1 drop 2 (two) times daily.    . iron polysaccharides (NU-IRON) 150 MG capsule Take 1 capsule (150 mg total) by mouth daily. 30 capsule 0  . latanoprost (XALATAN) 0.005 % ophthalmic solution Place 1 drop into both eyes at bedtime.    . metFORMIN (GLUCOPHAGE) 500 MG tablet Take 500 mg by mouth 2 (two) times daily with a meal.    . ondansetron (ZOFRAN) 8 MG tablet Take 1 tablet (8 mg total) by mouth every 8 (eight) hours as needed for nausea or vomiting. 30 tablet 0  . rivaroxaban (XARELTO) 10 MG  TABS tablet Take 1 tablet (10 mg total) by mouth daily with supper. 30 tablet 9  . Skin Protectants, Misc. (EUCERIN) cream Apply 1 application topically 2 (two) times daily as needed for dry skin.     Mar[place] Kitchen zinc oxide (BALMEX) 11.3 % CREA cream Apply 1 application topically 2 (two) times daily.     No current facility-administered medications for this visit.   Facility-Administered Medications Ordered in Other Visits  Medication Dose Route Frequency Provider Last Rate Last Admin  . sodium chloride flush (NS) 0.9 % injection 10 mL  10 mL Intravenous PRN Alvy Bimler, Ni, MD        REVIEW OF SYSTEMS:   Constitutional: ( - ) fevers, ( - )  chills , ( - ) night sweats Eyes: ( - ) blurriness of vision, ( - ) double vision, ( - ) watery eyes Ears, nose, mouth, throat, and face: ( - ) mucositis, ( - ) sore throat Respiratory: ( - ) cough, ( - ) dyspnea, ( - ) wheezes Cardiovascular: ( - ) palpitation, ( - ) chest discomfort, ( - ) lower extremity  swelling Gastrointestinal:  ( - ) nausea, ( - ) heartburn, ( - ) change in bowel habits Skin: ( - ) abnormal skin rashes Lymphatics: ( - ) new lymphadenopathy, ( - ) easy bruising Neurological: ( - ) numbness, ( - ) tingling, ( - ) new weaknesses Behavioral/Psych: ( - ) mood change, ( - ) new changes  All other systems were reviewed with the patient and are negative.  PHYSICAL EXAMINATION: ECOG PERFORMANCE STATUS: paraplegic.   Vitals:   10/22/20 0936  BP: 136/77  Pulse: 90  Resp: 16  Temp: 97.9 F (36.6 C)  SpO2: 100%   Filed Weights   10/22/20 0936  Weight: 216 lb 12.8 oz (98.3 kg)    GENERAL: well appearing middle aged [place] American male alert, no distress and comfortable SKIN: skin color, texture, turgor are normal, no rashes or significant lesions EYES: conjunctiva are pink and non-injected, sclera clear LUNGS: clear to auscultation and percussion with normal breathing effort HEART: regular rate & rhythm and no murmurs and no lower extremity edema Musculoskeletal: no cyanosis of digits and no clubbing  PSYCH: alert & oriented x 3, fluent speech NEURO: paraplegic, no use of LE bilaterally.   LABORATORY DATA:  I have reviewed the data as listed CBC Latest Ref Rng & Units 10/22/2020 09/23/2020 08/18/2020  WBC 4.0 - 10.5 K/uL 7.4 6.8 7.6  Hemoglobin 13.0 - 17.0 g/dL 12.6(L) 12.8(L) 12.8(L)  Hematocrit 39.0 - 52.0 % 36.7(L) 36.1(L) 37.5(L)  Platelets 150 - 400 K/uL 356 381 339    CMP Latest Ref Rng & Units 09/23/2020 08/18/2020 07/22/2020  Glucose 70 - 99 mg/dL 101(H) 118(H) 113(H)  BUN 8 - 23 mg/dL 10 10 10   Creatinine 0.61 - 1.24 mg/dL 0.82 0.80 0.78  Sodium 135 - 145 mmol/L 139 145 142  Potassium 3.5 - 5.1 mmol/L 4.0 3.5 3.7  Chloride 98 - 111 mmol/L 107 107 107  CO2 22 - 32 mmol/L 24 28 29   Calcium 8.9 - 10.3 mg/dL 9.5 9.4 9.8  Total Protein 6.5 - 8.1 g/dL 7.2 6.9 6.9  Total Bilirubin 0.3 - 1.2 mg/dL 0.4 0.5 0.5  Alkaline Phos 38 - 126 U/L 97 83 82  AST 15 - 41  U/L 16 16 14(L)  ALT 0 - 44 U/L 18 18 16     Lab Results  Component Value Date   MPROTEIN 0.3 (  H) 09/23/2020   MPROTEIN 0.2 (H) 08/18/2020   MPROTEIN 0.3 (H) 07/22/2020   Lab Results  Component Value Date   KPAFRELGTCHN 6.8 09/23/2020   KPAFRELGTCHN 8.3 08/18/2020   KPAFRELGTCHN 7.2 07/22/2020   LAMBDASER 5.9 09/23/2020   LAMBDASER 8.0 08/18/2020   LAMBDASER 8.0 07/22/2020   KAPLAMBRATIO 1.15 09/23/2020   KAPLAMBRATIO 1.04 08/18/2020   KAPLAMBRATIO 0.90 07/22/2020    RADIOGRAPHIC STUDIES: No results found.  ASSESSMENT & PLAN MATIAS THURMAN 65 y.o. male with medical history significant for  IgG Lambda Multiple Myeloma who presents for a follow up visit.  After review of the labs, discussion with the patient, and reviewed the imaging his findings are most consistent with a relapsed multiple myeloma.The patient has had excellent success before with treatment of his myeloma with Velcade, Revlimid, and dexamethasone.  Treatment regimen consists of bortezomib 1.71m/m2 subq (Day 1,8, and 15), daratumumab 1800 mg subq (day 1,8 and 15), and dexamethasone 20 mg PO (day 1, 8 and 15). After 3 cycles, daratumumab to be administered q 3 weeks (from cycles 4-8). After cycle 8, continue daratumumab q 4 weeks alone ([employer] CorningMed 2016; [id number]  On exam today, Mr. MSmallmanis doing well without any significant changes to his health. Labs were reviewed today that show stable anemia with Hgb of 12.6. Patient will proceed with treatment today and return to the clinic prior to Cycle 14 Day 1.   # IgG Lambda Multiple Myeloma, Relapsed (ISS Stage II) --findings are most consistent with relapsed multiple myeloma. Patient previously successfully treated with Velcade/Rev/Dex --current plan is Daratumumab/Velcade/Dex  (started 01/09/2020). On 07/02/2020 he transitioned to monthly daratumumab alone. Continue q 4 week dara. --recollect restaging MM labs monthly to include MM panel, SFLC. Additionally will  collect LDH and beta 2 microglobulin.  --will have patient evaluated by dentistry prior to restarting Zometa therapy. He had an episode of osteonecrosis in the past, risk of restarting may outweight benefit.  --return for start of Cycle 14 on 11/19/2020.    #History of DVT --He had placement of IVC filter, remains on Xarelto. --Due to poor mobility, and lack of bleeding complications, I recommend he remain on Xarelto indefinitely. --caution if Plt count were to drop <50  # Supportive Care -- provided patient with an albuterol inhaler (for use with daratumumab) --acyclovir 40753mBID for VZV prophylaxis --zofran 53m26m8H PRN for nausea/vomiting  --Zometa can be considered after dental clearance, though with his prior episode of osteonecrosis the risk may outweigh the benefits. --patient recieved pretreatment RBC phenotype  No orders of the defined types were placed in this encounter.  All questions were answered. The patient knows to call the clinic with any problems, questions or concerns.  A total of more than 30 minutes were spent on this encounter and over half of that time was spent on counseling and coordination of care as outlined above.   IreAmmie FerrierD Department of Hematology/Oncology [facility]: [phone]: [phone]: [email or web address]  10/22/2020 9:39 AM   Literature Support:  PalArchie Endoeisel K, Raliegh Ipooka AK, Masszi T, BekNikolaipiLakewood HunSchaumburg Munder M, MatInterlakenark TM, Qi M, Schecter J, AmiRichfield Springsin X, Deraedt W, Ahmadi T, Spencer A, Sonneveld P; [employer] Investigators. Daratumumab, Bortezomib, and Dexamethasone for Multiple Myeloma. N EAlta Corningd. 2016 Aug 25;375(8):754-66  --among patients with relapsed or relapsed and refractory multiple myeloma, daratumumab in  combination with bortezomib and dexamethasone resulted in significantly longer progression-free  survival than bortezomib and dexamethasone alone and was associated with infusion-related reactions and higher rates of thrombocytopenia and neutropenia than bortezomib and dexamethasone alone.

## 2020-10-22 NOTE — Patient Instructions (Signed)
[facility] Discharge Instructions for Patients Receiving Chemotherapy  Today you received the following chemotherapy agents: Darzalex Faspro  To help prevent nausea and vomiting after your treatment, we encourage you to take your nausea medication  as prescribed.    If you develop nausea and vomiting that is not controlled by your nausea medication, call the clinic.   BELOW ARE SYMPTOMS THAT SHOULD BE REPORTED IMMEDIATELY:  *FEVER GREATER THAN 100.5 F  *CHILLS WITH OR WITHOUT FEVER  NAUSEA AND VOMITING THAT IS NOT CONTROLLED WITH YOUR NAUSEA MEDICATION  *UNUSUAL SHORTNESS OF BREATH  *UNUSUAL BRUISING OR BLEEDING  TENDERNESS IN MOUTH AND THROAT WITH OR WITHOUT PRESENCE OF ULCERS  *URINARY PROBLEMS  *BOWEL PROBLEMS  UNUSUAL RASH Items with * indicate a potential emergency and should be followed up as soon as possible.  Feel free to call the clinic should you have any questions or concerns. The clinic phone number is [phone].  Please show the [facility] at check-in to the Emergency Department and triage nurse.

## 2020-10-25 LAB — MULTIPLE MYELOMA PANEL, SERUM
Albumin SerPl Elph-Mcnc: 3.4 g/dL (ref 2.9–4.4)
Albumin/Glob SerPl: 1.3 (ref 0.7–1.7)
Alpha 1: 0.2 g/dL (ref 0.0–0.4)
Alpha2 Glob SerPl Elph-Mcnc: 0.9 g/dL (ref 0.4–1.0)
B-Globulin SerPl Elph-Mcnc: 0.9 g/dL (ref 0.7–1.3)
Gamma Glob SerPl Elph-Mcnc: 0.6 g/dL (ref 0.4–1.8)
Globulin, Total: 2.7 g/dL (ref 2.2–3.9)
IgA: 35 mg/dL — ABNORMAL LOW (ref 61–437)
IgG (Immunoglobin G), Serum: 676 mg/dL (ref 603–1613)
IgM (Immunoglobulin M), Srm: 19 mg/dL — ABNORMAL LOW (ref 20–172)
M Protein SerPl Elph-Mcnc: 0.3 g/dL — ABNORMAL HIGH
Total Protein ELP: 6.1 g/dL (ref 6.0–8.5)

## 2020-10-25 LAB — KAPPA/LAMBDA LIGHT CHAINS
Kappa free light chain: 6.8 mg/L (ref 3.3–19.4)
Kappa, lambda light chain ratio: 0.86 (ref 0.26–1.65)
Lambda free light chains: 7.9 mg/L (ref 5.7–26.3)

## 2020-11-04 ENCOUNTER — Ambulatory Visit (INDEPENDENT_AMBULATORY_CARE_PROVIDER_SITE_OTHER): Payer: Medicare (Managed Care) | Admitting: Orthopedic Surgery

## 2020-11-04 ENCOUNTER — Ambulatory Visit: Payer: Self-pay

## 2020-11-04 ENCOUNTER — Ambulatory Visit (INDEPENDENT_AMBULATORY_CARE_PROVIDER_SITE_OTHER): Payer: Medicare (Managed Care)

## 2020-11-04 ENCOUNTER — Encounter: Payer: Self-pay | Admitting: Orthopedic Surgery

## 2020-11-04 VITALS — Ht 68.0 in | Wt 216.0 lb

## 2020-11-04 DIAGNOSIS — M899 Disorder of bone, unspecified: Secondary | ICD-10-CM

## 2020-11-04 NOTE — Progress Notes (Signed)
Office Visit Note   Patient: [name]           Date of Birth: 02-05-1956           MRN: [id number] Visit Date: 11/04/2020              Requested by: [name], MD [address] [place],  [place] [address] PCP: [name], MD  Chief Complaint  Patient presents with  . Right Leg - Follow-up    Lytic lesion  . 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  Patient is asymptomatic.  Assessment & Plan: Visit Diagnoses:  1. Lytic bone lesion of right femur   2. Lytic bone lesion of left femur     Plan: The lytic lesions from multiple myeloma in both femurs has showed no advancement over the serial radiographs that have been obtained.  We will plan to follow-up in 6 months for repeat 2 view radiographs of both femurs.  Follow-Up Instructions: Return in about 6 months (around 05/06/2021).   Ortho Exam  Patient is alert, oriented, no adenopathy, well-dressed, normal affect, normal respiratory effort. Examination patient is seen in follow-up for lytic lesions secondary to multiple myeloma both femurs.  Patient denies any symptoms.  Radiograph shows no progression of the lytic lesions no clinical signs of pending fracture.  Imaging: XR FEMUR MIN 2 VIEWS LEFT  Result Date: 11/04/2020 Three-view radiographs of the left femur shows a small stable lytic lesion with increased cortical bone.  XR FEMUR, MIN 2 VIEWS RIGHT  Result Date: 11/04/2020 2 view radiographs of the right femur shows a scalloped the lesion with no advancement and no cortical destruction there is still greater than 50% of the cortical bone.  No images are attached to the encounter.  Labs: Lab Results  Component Value Date   HGBA1C 6.0 (H) 07/22/2014   ESRSEDRATE 73 (H) 10/21/2013   ESRSEDRATE 55 (H) 07/23/2013   ESRSEDRATE 95 (H) 07/13/2010   CRP 1.0 (H) 07/14/2010    LABURIC 8.2 (H) 07/21/2014   LABURIC 5.1 09/02/2010   REPTSTATUS 01/25/2020 FINAL 01/21/2020   CULT (A) 01/21/2020    >=100,000 COLONIES/mL PROTEUS MIRABILIS 20,000 COLONIES/mL STAPHYLOCOCCUS AUREUS    LABORGA PROTEUS MIRABILIS (A) 01/21/2020   LABORGA STAPHYLOCOCCUS AUREUS (A) 01/21/2020     Lab Results  Component Value Date   ALBUMIN 3.5 10/22/2020   ALBUMIN 4.0 09/23/2020   ALBUMIN 3.7 08/18/2020    Lab Results  Component Value Date   MG 1.8 01/21/2020   MG 1.5 07/21/2014   MG 1.3 (L) 02/13/2013   Lab Results  Component Value Date   VD25OH 59 04/08/2013   VD25OH 58 01/16/2013   VD25OH 63 11/15/2012    No results found for: PREALBUMIN CBC EXTENDED Latest Ref Rng & Units 10/22/2020 09/23/2020 08/18/2020  WBC 4.0 - 10.5 K/uL 7.4 6.8 7.6  RBC 4.22 - 5.81 MIL/uL 4.70 4.76 4.74  HGB 13.0 - 17.0 g/dL 12.6(L) 12.8(L) 12.8(L)  HCT 39.0 - 52.0 % 36.7(L) 36.1(L) 37.5(L)  PLT 150 - 400 K/uL 356 381 339  NEUTROABS 1.7 - 7.7 K/uL 5.3 4.5 5.5  LYMPHSABS 0.7 - 4.0 K/uL 1.1 1.2 1.1     Body mass index is 32.84 kg/m.  Orders:  Orders Placed This Encounter  Procedures  . XR FEMUR MIN 2 VIEWS LEFT  .  XR FEMUR, MIN 2 VIEWS RIGHT   No orders of the defined types were placed in this encounter.    Procedures: No procedures performed  Clinical Data: No additional findings.  ROS:  All other systems negative, except as noted in the HPI. Review of Systems  Objective: Vital Signs: Ht 5' 8"  (1.727 m)   Wt 216 lb (98 kg)   BMI 32.84 kg/m   Specialty Comments:  No specialty comments available.  PMFS History: Patient Active Problem List   Diagnosis Date Noted  . Goals of care, counseling/discussion 10/09/2019  . On rivaroxaban therapy   . Status post colonoscopy with polypectomy   . Lower GI bleeding 04/18/2017  . Acute blood loss anemia 04/18/2017  . Diabetes mellitus type 2 in obese ([place]) 04/18/2017  . Colon cancer screening   . Benign neoplasm of ascending  colon   . History of DVT (deep vein thrombosis) 06/10/2015  . Obstructed suprapubic catheter ([place]) 07/21/2014  . Bilateral hydronephrosis 07/21/2014  . Acute kidney failure ([place]) 07/20/2014  . Abdominal pain 07/20/2014  . AKI (acute kidney injury) ([place]) 07/20/2014  . Hematuria 02/12/2013  . Shock ([place]) 02/12/2013  . Adrenal insufficiency ([place]) 02/12/2013  . Hypoalbuminemia 11/15/2012  . Anemia, unspecified 09/18/2012  . Unspecified vitamin D deficiency 09/18/2012  . 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 Iron deficiency anemia 09/18/2012  . Diverticulitis of large intestine with perforation 07/26/2011  . Multiple myeloma in relapse ([place]) 10/14/2010  . Neurogenic bowel 10/14/2010  . Paraplegia ([place]) 07/13/2010  . Neurogenic bladder 07/11/2010  . BACK PAIN, LUMBAR, WITH RADICULOPATHY 07/07/2010  . DISEQUILIBRIUM 07/07/2010  . Abdominal pain, generalized 07/07/2010  . OBESITY, NOS 09/20/2006   Past Medical History:  Diagnosis Date  . Adrenal insufficiency (HCC)    on chronic dexamethasone  . Anemia   . Cancer ([place])   . Coagulopathy ([place])    on xeralto/ s/p DVT while on coumadin,  IVC in place  . Diabetes mellitus without complication ([place])    type 2  . Gross hematuria 7/14   post foley cath procedure  . History of blood transfusion 7/14  . Multiple myeloma    thoracic T8 with paraplegia s/p resection- on chemo at visit 10/13/10  . Multiple myeloma   . Multiple myeloma without mention of remission   . Neurogenic bladder   . Neurogenic bowel   . Paraplegia ([place])   . Partial small bowel obstruction ([place]) during dec 2011 admission    Family History  Problem Relation Age of Onset  . Ovarian cancer Mother   . Diabetes Father     Past Surgical History:  Procedure Laterality Date  . COLONOSCOPY WITH PROPOFOL N/A 04/12/2017   Procedure: COLONOSCOPY WITH PROPOFOL;  Surgeon: Irene Shipper, MD;  [facility];  Service: Endoscopy;   Laterality: N/A;  . COLONOSCOPY WITH PROPOFOL N/A 04/19/2017   Procedure: COLONOSCOPY WITH PROPOFOL;  Surgeon: Yetta Flock, MD;  [facility];  Service: Gastroenterology;  Laterality: N/A;  . COLOSTOMY  07/20/2011   Procedure: COLOSTOMY;  Surgeon: Judieth Keens, DO;  Location: [facility];  Service: General;;  . COLOSTOMY REVISION  07/20/2011   Procedure: COLON RESECTION SIGMOID;  Surgeon: Judieth Keens, DO;  Location: [facility];  Service: General;;  . CYSTOSCOPY N/A 04/04/2013   Procedure: CYSTOSCOPY WITH LITHALOPAXY;  Surgeon: Alexis Frock, MD;  [facility];  Service: Urology;  Laterality: N/A;  . INSERTION OF SUPRAPUBIC CATHETER N/A 04/04/2013  Procedure: INSERTION OF SUPRAPUBIC CATHETER;  Surgeon: Alexis Frock, MD;  [facility];  Service: Urology;  Laterality: N/A;  . LAPAROTOMY  07/20/2011   Procedure: EXPLORATORY LAPAROTOMY;  Surgeon: Judieth Keens, DO;  Location: [facility];  Service: General;  Laterality: N/A;  . myeloma thoracic T8 with parpaplegia s/p thoracotomy and thoracic T7-9 cage placement on Dec 26th 2011  07/18/10   Social History   Occupational History  . Not on file  Tobacco Use  . Smoking status: Never Smoker  . Smokeless tobacco: Never Used  Vaping Use  . Vaping Use: Never used  Substance and Sexual Activity  . Alcohol use: No  . Drug use: No  . Sexual activity: Never

## 2020-11-18 ENCOUNTER — Other Ambulatory Visit: Payer: Medicare (Managed Care)

## 2020-11-18 ENCOUNTER — Ambulatory Visit: Payer: Medicare (Managed Care) | Admitting: Hematology and Oncology

## 2020-11-19 ENCOUNTER — Inpatient Hospital Stay: Payer: Medicare (Managed Care)

## 2020-11-19 ENCOUNTER — Inpatient Hospital Stay (HOSPITAL_BASED_OUTPATIENT_CLINIC_OR_DEPARTMENT_OTHER): Payer: Medicare (Managed Care) | Admitting: Physician Assistant

## 2020-11-19 ENCOUNTER — Other Ambulatory Visit: Payer: Medicare (Managed Care)

## 2020-11-19 ENCOUNTER — Other Ambulatory Visit: Payer: Self-pay

## 2020-11-19 ENCOUNTER — Inpatient Hospital Stay: Payer: Medicare (Managed Care) | Admitting: Hematology and Oncology

## 2020-11-19 VITALS — BP 160/84 | HR 80 | Temp 97.8°F | Resp 18

## 2020-11-19 DIAGNOSIS — Z7189 Other specified counseling: Secondary | ICD-10-CM

## 2020-11-19 DIAGNOSIS — Z5112 Encounter for antineoplastic immunotherapy: Secondary | ICD-10-CM | POA: Diagnosis not present

## 2020-11-19 DIAGNOSIS — C9002 Multiple myeloma in relapse: Secondary | ICD-10-CM

## 2020-11-19 LAB — CBC WITH DIFFERENTIAL (CANCER CENTER ONLY)
Abs Immature Granulocytes: 0.02 10*3/uL (ref 0.00–0.07)
Basophils Absolute: 0 10*3/uL (ref 0.0–0.1)
Basophils Relative: 1 %
Eosinophils Absolute: 0.5 10*3/uL (ref 0.0–0.5)
Eosinophils Relative: 6 %
HCT: 38.2 % — ABNORMAL LOW (ref 39.0–52.0)
Hemoglobin: 13.1 g/dL (ref 13.0–17.0)
Immature Granulocytes: 0 %
Lymphocytes Relative: 14 %
Lymphs Abs: 1 10*3/uL (ref 0.7–4.0)
MCH: 26.8 pg (ref 26.0–34.0)
MCHC: 34.3 g/dL (ref 30.0–36.0)
MCV: 78.3 fL — ABNORMAL LOW (ref 80.0–100.0)
Monocytes Absolute: 0.6 10*3/uL (ref 0.1–1.0)
Monocytes Relative: 8 %
Neutro Abs: 5.3 10*3/uL (ref 1.7–7.7)
Neutrophils Relative %: 71 %
Platelet Count: 334 10*3/uL (ref 150–400)
RBC: 4.88 MIL/uL (ref 4.22–5.81)
RDW: 16.3 % — ABNORMAL HIGH (ref 11.5–15.5)
WBC Count: 7.5 10*3/uL (ref 4.0–10.5)
nRBC: 0 % (ref 0.0–0.2)

## 2020-11-19 LAB — CMP (CANCER CENTER ONLY)
ALT: 24 U/L (ref 0–44)
AST: 18 U/L (ref 15–41)
Albumin: 3.7 g/dL (ref 3.5–5.0)
Alkaline Phosphatase: 117 U/L (ref 38–126)
Anion gap: 9 (ref 5–15)
BUN: 13 mg/dL (ref 8–23)
CO2: 27 mmol/L (ref 22–32)
Calcium: 9.5 mg/dL (ref 8.9–10.3)
Chloride: 108 mmol/L (ref 98–111)
Creatinine: 0.83 mg/dL (ref 0.61–1.24)
GFR, Estimated: >60 mL/min (ref >60)
Glucose, Bld: 109 mg/dL — ABNORMAL HIGH (ref 70–99)
Potassium: 3.8 mmol/L (ref 3.5–5.1)
Sodium: 144 mmol/L (ref 135–145)
Total Bilirubin: 0.4 mg/dL (ref 0.3–1.2)
Total Protein: 6.8 g/dL (ref 6.5–8.1)

## 2020-11-19 LAB — LACTATE DEHYDROGENASE: LDH: 128 U/L (ref 98–192)

## 2020-11-19 MED ORDER — DEXAMETHASONE 4 MG PO TABS
20.0000 mg | ORAL_TABLET | Freq: Once | ORAL | Status: AC
Start: 2020-11-19 — End: 2020-11-19
  Administered 2020-11-19: 20 mg via ORAL

## 2020-11-19 MED ORDER — ACETAMINOPHEN 325 MG PO TABS
650.0000 mg | ORAL_TABLET | Freq: Once | ORAL | Status: AC
  Administered 2020-11-19: 650 mg via ORAL

## 2020-11-19 MED ORDER — DIPHENHYDRAMINE HCL 25 MG PO CAPS
ORAL_CAPSULE | ORAL | Status: AC
  Filled 2020-11-19: qty 1

## 2020-11-19 MED ORDER — DARATUMUMAB-HYALURONIDASE-FIHJ 1800-30000 MG-UT/15ML ~~LOC~~ SOLN
1800.0000 mg | Freq: Once | SUBCUTANEOUS | Status: AC
  Administered 2020-11-19: 1800 mg via SUBCUTANEOUS
  Filled 2020-11-19: qty 15

## 2020-11-19 MED ORDER — ACETAMINOPHEN 325 MG PO TABS
ORAL_TABLET | ORAL | Status: AC
  Filled 2020-11-19: qty 2

## 2020-11-19 MED ORDER — DEXAMETHASONE 4 MG PO TABS
ORAL_TABLET | ORAL | Status: AC
  Filled 2020-11-19: qty 5

## 2020-11-19 MED ORDER — DIPHENHYDRAMINE HCL 25 MG PO CAPS
25.0000 mg | ORAL_CAPSULE | Freq: Once | ORAL | Status: AC
  Administered 2020-11-19: 25 mg via ORAL

## 2020-11-19 NOTE — Progress Notes (Signed)
[name] [phone]   [phone]  PROGRESS NOTE  Patient Care Team: [name], MD as PCP - General (Family Medicine) [name], MD as Consulting Physician (Physical Medicine and Rehabilitation) Donia Guiles Lavon Paganini, PA-C as Physician Assistant (Physical Medicine and Rehabilitation) [name], MD as Consulting Physician (Hematology and Oncology)  Hematological/Oncological History # IgG Lambda Multiple Myeloma, Relapsed (ISS Stage II) 1) 06/2010: initial diagnosis of Multiple Myeloma after T8 compression fracture. Treated with Velcade/Revlimid/Dexamethasone and achieved a complete remission 2) Velcade was discontinued in September 2012 and that Revlimid and Decadron were discontinued in March 2013. 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 4) Followed by Dr. Alvy Bimler, last clinic visit 10/09/2019. At that time there was concern for relapse of his multiple myeloma.  5) Patient requested transfer to different provider after misunderstanding regarding imaging studies 6) 12/17/2019: transfer care to Dr. Lorenso Courier  7) 01/09/2020: Cycle 1 Day 1 of Dara/Velcade/Dex 8) 01/21/2020: presented as urgent visit for diarrhea and dehydration. Holding chemotherapy scheduled for 01/23/2020. 9) 01/30/2020: Resume dara/velcade/dex after resolution of diarrhea.  10) 02/13/2020: restaging labs show M protein 0.8, Kappa 4.5, lamba 17.2, ratio 0.26, urine M protein 53 (7.1%). All MM labs indicate improvement.  11) 03/10/2020: Cycle 4 Day 1 of Dara/Velcade/Dex. Transition to q 3 week daratumumab.  12) 06/04/2020:  Cycle 8 Day 1 of Dara/Velcade/Dex 13) 06/25/2020:  Cycle 9 Day 1 of Dara/Velcade/Dex 14) 07/22/2020: Cycle 10 Day 1 of  Dara//Dex 15) 08/18/2020: Cycle 11 Day 1 of Dara//Dex 16) 09/23/2020: Cycle 12 Day 1 of Dara//Dex 17) 10/22/2020: Cycle 13 Day 1 of Dara/Dex  18)  11/19/2020: Cycle 14 Day 1 of Dara/Dex  Interval History:   [name] 65 y.o. male with medical history significant for  IgG Lambda Multiple Myeloma who presents for a follow up visit. The patient's last visit was on 10/22/2020. In the interim he has had no changes in his health status.   On exam today Mr. Coble continues to tolerate the treatment well and from multiple myeloma standpoint has had no new issues arise. His energy levels and appetite are stable. He denies any nausea, vomiting or abdominal pain. He reports that his urine and ostomy outputs have both been stable and normal. He denies have any numbness or tingling of his fingers or hands. He denies any fevers, chills, sweats, shortness of breath, chest pain or cough. He has no other complaints. A full 10 point ROS is listed below  MEDICAL HISTORY:  Past Medical History:  Diagnosis Date  . Adrenal insufficiency (HCC)    on chronic dexamethasone  . Anemia   . Cancer ([place])   . Coagulopathy ([place])    on xeralto/ s/p DVT while on coumadin,  IVC in place  . Diabetes mellitus without complication ([place])    type 2  . Gross hematuria 7/14   post foley cath procedure  . History of blood transfusion 7/14  . Multiple myeloma    thoracic T8 with paraplegia s/p resection- on chemo at visit 10/13/10  . Multiple myeloma   . Multiple myeloma without mention of remission   . Neurogenic bladder   . Neurogenic bowel   . Paraplegia ([place])   . Partial small bowel obstruction ([place]) during dec 2011 admission    SURGICAL HISTORY: Past Surgical History:  Procedure Laterality Date  . COLONOSCOPY WITH PROPOFOL N/A 04/12/2017   Procedure: COLONOSCOPY WITH  PROPOFOL;  Surgeon: Irene Shipper, MD;  Location: [facility];  Service: Endoscopy;  Laterality: N/A;  . COLONOSCOPY WITH PROPOFOL N/A 04/19/2017   Procedure: COLONOSCOPY WITH PROPOFOL;  Surgeon: Yetta Flock, MD;  Location: [facility];  Service: Gastroenterology;  Laterality: N/A;  .  COLOSTOMY  07/20/2011   Procedure: COLOSTOMY;  Surgeon: Judieth Keens, DO;  Location: [facility];  Service: General;;  . COLOSTOMY REVISION  07/20/2011   Procedure: COLON RESECTION SIGMOID;  Surgeon: Judieth Keens, DO;  Location: [facility];  Service: General;;  . CYSTOSCOPY N/A 04/04/2013   Procedure: CYSTOSCOPY WITH LITHALOPAXY;  Surgeon: Alexis Frock, MD;  Location: [facility];  Service: Urology;  Laterality: N/A;  . INSERTION OF SUPRAPUBIC CATHETER N/A 04/04/2013   Procedure: INSERTION OF SUPRAPUBIC CATHETER;  Surgeon: Alexis Frock, MD;  Location: [facility];  Service: Urology;  Laterality: N/A;  . LAPAROTOMY  07/20/2011   Procedure: EXPLORATORY LAPAROTOMY;  Surgeon: Judieth Keens, DO;  Location: [facility];  Service: General;  Laterality: N/A;  . myeloma thoracic T8 with parpaplegia s/p thoracotomy and thoracic T7-9 cage placement on Dec 26th 2011  07/18/10    SOCIAL HISTORY: Social History   Socioeconomic History  . Marital status: Married    Spouse name: Not on file  . Number of children: Not on file  . Years of education: Not on file  . Highest education level: Not on file  Occupational History  . Not on file  Tobacco Use  . Smoking status: Never Smoker  . Smokeless tobacco: Never Used  Vaping Use  . Vaping Use: Never used  Substance and Sexual Activity  . Alcohol use: No  . Drug use: No  . Sexual activity: Never  Other Topics Concern  . Not on file  Social History Narrative  . Not on file   Social Determinants of Health   Financial Resource Strain: Not on file  Food Insecurity: Not on file  Transportation Needs: Not on file  Physical Activity: Not on file  Stress: Not on file  Social Connections: Not on file  Intimate Partner Violence: Not on file    FAMILY HISTORY: Family History  Problem Relation Age of Onset  . Ovarian cancer Mother   . Diabetes Father     ALLERGIES:  is allergic to feraheme [ferumoxytol].  MEDICATIONS:  Current Outpatient Medications   Medication Sig Dispense Refill  . acyclovir (ZOVIRAX) 400 MG tablet Take 1 tablet (400 mg total) by mouth 2 (two) times daily. 60 tablet 2  . atorvastatin (LIPITOR) 10 MG tablet Take 10 mg by mouth daily.    . baclofen (LIORESAL) 20 MG tablet Take 20 mg by mouth 2 (two) times daily.    . diazepam (DIASTAT ACUDIAL) 10 MG GEL SMARTSIG:By Mouth    . dorzolamide (TRUSOPT) 2 % ophthalmic solution 1 drop 2 (two) times daily.    . iron polysaccharides (NU-IRON) 150 MG capsule Take 1 capsule (150 mg total) by mouth daily. 30 capsule 0  . latanoprost (XALATAN) 0.005 % ophthalmic solution Place 1 drop into both eyes at bedtime.    . ondansetron (ZOFRAN) 8 MG tablet Take 1 tablet (8 mg total) by mouth every 8 (eight) hours as needed for nausea or vomiting. 30 tablet 0  . rivaroxaban (XARELTO) 10 MG TABS tablet Take 1 tablet (10 mg total) by mouth daily with supper. 30 tablet 9  . Skin Protectants, Misc. (EUCERIN) cream Apply 1 application topically 2 (two) times daily as needed for dry  skin.     . zinc oxide (BALMEX) 11.3 % CREA cream Apply 1 application topically 2 (two) times daily.     No current facility-administered medications for this visit.   Facility-Administered Medications Ordered in Other Visits  Medication Dose Route Frequency Provider Last Rate Last Admin  . sodium chloride flush (NS) 0.9 % injection 10 mL  10 mL Intravenous PRN Alvy Bimler, Ni, MD        REVIEW OF SYSTEMS:   Constitutional: ( - ) fevers, ( - )  chills , ( - ) night sweats Eyes: ( - ) blurriness of vision, ( - ) double vision, ( - ) watery eyes Ears, nose, mouth, throat, and face: ( - ) mucositis, ( - ) sore throat Respiratory: ( - ) cough, ( - ) dyspnea, ( - ) wheezes Cardiovascular: ( - ) palpitation, ( - ) chest discomfort, ( - ) lower extremity swelling Gastrointestinal:  ( - ) nausea, ( - ) heartburn, ( - ) change in bowel habits Skin: ( - ) abnormal skin rashes Lymphatics: ( - ) new lymphadenopathy, ( - ) easy  bruising Neurological: ( - ) numbness, ( - ) tingling, ( - ) new weaknesses Behavioral/Psych: ( - ) mood change, ( - ) new changes  All other systems were reviewed with the patient and are negative.  PHYSICAL EXAMINATION: ECOG PERFORMANCE STATUS: paraplegic.   Vitals:   11/19/20 1030  BP: (!) 160/84  Pulse: 80  Resp: 18  Temp: 97.8 F (36.6 C)  SpO2: 100%   Filed Weights    GENERAL: well appearing middle aged [place] American male alert, no distress and comfortable SKIN: skin color, texture, turgor are normal, no rashes or significant lesions EYES: conjunctiva are pink and non-injected, sclera clear LUNGS: clear to auscultation and percussion with normal breathing effort HEART: regular rate & rhythm and no murmurs and no lower extremity edema Musculoskeletal: no cyanosis of digits and no clubbing  PSYCH: alert & oriented x 3, fluent speech NEURO: paraplegic, no use of LE bilaterally.   LABORATORY DATA:  I have reviewed the data as listed CBC Latest Ref Rng & Units 11/19/2020 10/22/2020 09/23/2020  WBC 4.0 - 10.5 K/uL 7.5 7.4 6.8  Hemoglobin 13.0 - 17.0 g/dL 13.1 12.6(L) 12.8(L)  Hematocrit 39.0 - 52.0 % 38.2(L) 36.7(L) 36.1(L)  Platelets 150 - 400 K/uL 334 356 381    CMP Latest Ref Rng & Units 11/19/2020 10/22/2020 09/23/2020  Glucose 70 - 99 mg/dL 109(H) 107(H) 101(H)  BUN 8 - 23 mg/dL _0 Creatinine 0.61 - 1.24 mg/dL 0.83 0.74 0.82  Sodium 135 - 145 mmol/L 144 145 139  Potassium 3.5 - 5.1 mmol/L 3.8 3.6 4.0  Chloride 98 - 111 mmol/L 108 107 107  CO2 22 - 32 mmol/L _1 Calcium 8.9 - 10.3 mg/dL 9.5 8.9 9.5  Total Protein 6.5 - 8.1 g/dL 6.8 6.5 7.2  Total Bilirubin 0.3 - 1.2 mg/dL 0.4 0.5 0.4  Alkaline Phos 38 - 126 U/L 117 105 97  AST 15 - 41 U/L _2 ALT 0 - 44 U/L 24 33 18    Lab Results  Component Value Date   MPROTEIN 0.3 (H) 10/22/2020   MPROTEIN 0.3 (H) 09/23/2020   MPROTEIN 0.2 (H) 08/18/2020   Lab Results  Component Value Date   KPAFRELGTCHN  6.8 10/22/2020   KPAFRELGTCHN 6.8 09/23/2020   KPAFRELGTCHN 8.3 08/18/2020   LAMBDASER 7.9 10/22/2020   LAMBDASER 5.9  09/23/2020   LAMBDASER 8.0 08/18/2020   KAPLAMBRATIO 0.86 10/22/2020   KAPLAMBRATIO 1.15 09/23/2020   KAPLAMBRATIO 1.04 08/18/2020    RADIOGRAPHIC STUDIES: XR FEMUR MIN 2 VIEWS LEFT  Result Date: 11/04/2020 Three-view radiographs of the left femur shows a small stable lytic lesion with increased cortical bone.  XR FEMUR, MIN 2 VIEWS RIGHT  Result Date: 11/04/2020 2 view radiographs of the right femur shows a scalloped the lesion with no advancement and no cortical destruction there is still greater than 50% of the cortical bone.   ASSESSMENT & PLAN [name] 65 y.o. male with medical history significant for  IgG Lambda Multiple Myeloma who presents for a follow up visit.  After review of the labs, discussion with the patient, and reviewed the imaging his findings are most consistent with a relapsed multiple myeloma.The patient has had excellent success before with treatment of his myeloma with Velcade, Revlimid, and dexamethasone.  Treatment regimen consists of bortezomib 1.106m/m2 subq (Day 1,8, and 15), daratumumab 1800 mg subq (day 1,8 and 15), and dexamethasone 20 mg PO (day 1, 8 and 15). After 3 cycles, daratumumab to be administered q 3 weeks (from cycles 4-8). After cycle 8, continue daratumumab q 4 weeks alone ([employer] CorningMed 2016; [id number]  On exam today, Mr. MDeytonis doing well without any significant changes to his health. Labs were reviewed today without any intervention needed. Hemoglobin levels are back to normal limits. Patient will proceed with treatment today and return to the clinic prior to Cycle 15 Day 1.   # IgG Lambda Multiple Myeloma, Relapsed (ISS Stage II) --findings are most consistent with relapsed multiple myeloma. Patient previously successfully treated with Velcade/Rev/Dex --current plan is Daratumumab/Velcade/Dex  (started  01/09/2020). On 07/02/2020 he transitioned to monthly daratumumab alone. Continue q 4 week dara. --recollect restaging MM labs monthly to include MM panel, SFLC. Additionally will collect LDH and beta 2 microglobulin.  --will have patient evaluated by dentistry prior to restarting Zometa therapy. He had an episode of osteonecrosis in the past, risk of restarting may outweight benefit.  --return for start of Cycle 15 on 12/17/2020.    #History of DVT --He had placement of IVC filter, remains on Xarelto. --Due to poor mobility, and lack of bleeding complications, I recommend he remain on Xarelto indefinitely. --caution if Plt count were to drop <50  # Supportive Care -- provided patient with an albuterol inhaler (for use with daratumumab) --acyclovir 4087mBID for VZV prophylaxis --zofran 13m413m8H PRN for nausea/vomiting  --Zometa can be considered after dental clearance, though with his prior episode of osteonecrosis the risk may outweigh the benefits. --patient recieved pretreatment RBC phenotype  No orders of the defined types were placed in this encounter.  All questions were answered. The patient knows to call the clinic with any problems, questions or concerns.  A total of more than 30 minutes were spent on this encounter and over half of that time was spent on counseling and coordination of care as outlined above.   IreDede Query-C Department of Hematology/Oncology [facility] Center[facility]: [id number]/2022 10:57 AM   Literature Support:  PalLaurene Footmanooka [place], Masszi T, [place] Munder M, MatSan Juan Capistranoar[place]i M, Schecter J, AmiAtlantain X, Deraedt W, Ahmadi T, Spencer A, Sonneveld P; [employer] Investigators. Daratumumab, Bortezomib, and Dexamethasone for Multiple Myeloma. N EAlta Corningd. 2016 Aug 25;375(8):754-66  --among  patients with relapsed or relapsed and refractory multiple myeloma, daratumumab  in combination with bortezomib and dexamethasone resulted in significantly longer progression-free survival than bortezomib and dexamethasone alone and was associated with infusion-related reactions and higher rates of thrombocytopenia and neutropenia than bortezomib and dexamethasone alone.   Patient was seen with Dr. Irene Limbo.    ADDENDUM  .Patient was Personally and independently interviewed, examined and relevant elements of the history of present illness were reviewed in details and an assessment and plan was created. All elements of the patient's history of present illness , assessment and plan were discussed in details with Dede Query PA-C. The above documentation reflects our combined findings assessment and plan.  Sullivan Lone MD MS

## 2020-11-22 ENCOUNTER — Telehealth: Payer: Self-pay

## 2020-11-22 NOTE — Telephone Encounter (Signed)
[place], NP with [facility] would like a call back concerning patient's left shoulder pain.  Cb# [phone].  Please advise.  Thank you.

## 2020-11-22 NOTE — Telephone Encounter (Signed)
Dr. Sharol Given called and sw NP to advise that we could offer cortisone injection left should reviewed CT scan from 10/2019 advised looks like pt has a hill sachs lesion NP advised that she will relay this information to the pt and will call to schedule should he want to pursue injection.

## 2020-12-17 ENCOUNTER — Other Ambulatory Visit: Payer: Self-pay

## 2020-12-17 ENCOUNTER — Ambulatory Visit: Payer: Medicare (Managed Care) | Admitting: Physician Assistant

## 2020-12-17 ENCOUNTER — Encounter: Payer: Self-pay | Admitting: Hematology and Oncology

## 2020-12-17 ENCOUNTER — Other Ambulatory Visit: Payer: Medicare (Managed Care)

## 2020-12-17 ENCOUNTER — Inpatient Hospital Stay (HOSPITAL_BASED_OUTPATIENT_CLINIC_OR_DEPARTMENT_OTHER): Payer: Medicare (Managed Care) | Admitting: Hematology and Oncology

## 2020-12-17 ENCOUNTER — Inpatient Hospital Stay: Payer: Medicare (Managed Care) | Attending: Hematology and Oncology

## 2020-12-17 ENCOUNTER — Inpatient Hospital Stay: Payer: Medicare (Managed Care)

## 2020-12-17 VITALS — BP 122/79 | HR 94 | Temp 98.0°F | Resp 20 | Wt 244.9 lb

## 2020-12-17 DIAGNOSIS — Z8041 Family history of malignant neoplasm of ovary: Secondary | ICD-10-CM | POA: Diagnosis not present

## 2020-12-17 DIAGNOSIS — Z5112 Encounter for antineoplastic immunotherapy: Secondary | ICD-10-CM | POA: Diagnosis present

## 2020-12-17 DIAGNOSIS — Z95828 Presence of other vascular implants and grafts: Secondary | ICD-10-CM

## 2020-12-17 DIAGNOSIS — D649 Anemia, unspecified: Secondary | ICD-10-CM | POA: Insufficient documentation

## 2020-12-17 DIAGNOSIS — Z7901 Long term (current) use of anticoagulants: Secondary | ICD-10-CM | POA: Insufficient documentation

## 2020-12-17 DIAGNOSIS — Z86718 Personal history of other venous thrombosis and embolism: Secondary | ICD-10-CM | POA: Diagnosis not present

## 2020-12-17 DIAGNOSIS — Z7189 Other specified counseling: Secondary | ICD-10-CM | POA: Diagnosis not present

## 2020-12-17 DIAGNOSIS — C9002 Multiple myeloma in relapse: Secondary | ICD-10-CM

## 2020-12-17 DIAGNOSIS — E119 Type 2 diabetes mellitus without complications: Secondary | ICD-10-CM | POA: Insufficient documentation

## 2020-12-17 DIAGNOSIS — Z79899 Other long term (current) drug therapy: Secondary | ICD-10-CM | POA: Diagnosis not present

## 2020-12-17 LAB — CMP (CANCER CENTER ONLY)
ALT: 38 U/L (ref 0–44)
AST: 23 U/L (ref 15–41)
Albumin: 3.5 g/dL (ref 3.5–5.0)
Alkaline Phosphatase: 109 U/L (ref 38–126)
Anion gap: 12 (ref 5–15)
BUN: 11 mg/dL (ref 8–23)
CO2: 25 mmol/L (ref 22–32)
Calcium: 9.5 mg/dL (ref 8.9–10.3)
Chloride: 109 mmol/L (ref 98–111)
Creatinine: 0.8 mg/dL (ref 0.61–1.24)
GFR, Estimated: >60 mL/min (ref >60)
Glucose, Bld: 145 mg/dL — ABNORMAL HIGH (ref 70–99)
Potassium: 3.6 mmol/L (ref 3.5–5.1)
Sodium: 146 mmol/L — ABNORMAL HIGH (ref 135–145)
Total Bilirubin: 0.5 mg/dL (ref 0.3–1.2)
Total Protein: 6.6 g/dL (ref 6.5–8.1)

## 2020-12-17 LAB — CBC WITH DIFFERENTIAL (CANCER CENTER ONLY)
Abs Immature Granulocytes: 0.01 10*3/uL (ref 0.00–0.07)
Basophils Absolute: 0.1 10*3/uL (ref 0.0–0.1)
Basophils Relative: 1 %
Eosinophils Absolute: 0.4 10*3/uL (ref 0.0–0.5)
Eosinophils Relative: 5 %
HCT: 36.3 % — ABNORMAL LOW (ref 39.0–52.0)
Hemoglobin: 12.7 g/dL — ABNORMAL LOW (ref 13.0–17.0)
Immature Granulocytes: 0 %
Lymphocytes Relative: 16 %
Lymphs Abs: 1.1 10*3/uL (ref 0.7–4.0)
MCH: 27.3 pg (ref 26.0–34.0)
MCHC: 35 g/dL (ref 30.0–36.0)
MCV: 77.9 fL — ABNORMAL LOW (ref 80.0–100.0)
Monocytes Absolute: 0.5 10*3/uL (ref 0.1–1.0)
Monocytes Relative: 8 %
Neutro Abs: 5 10*3/uL (ref 1.7–7.7)
Neutrophils Relative %: 70 %
Platelet Count: 347 10*3/uL (ref 150–400)
RBC: 4.66 MIL/uL (ref 4.22–5.81)
RDW: 15.8 % — ABNORMAL HIGH (ref 11.5–15.5)
WBC Count: 7.2 10*3/uL (ref 4.0–10.5)
nRBC: 0 % (ref 0.0–0.2)

## 2020-12-17 LAB — LACTATE DEHYDROGENASE: LDH: 110 U/L (ref 98–192)

## 2020-12-17 MED ORDER — DARATUMUMAB-HYALURONIDASE-FIHJ 1800-30000 MG-UT/15ML ~~LOC~~ SOLN
1800.0000 mg | Freq: Once | SUBCUTANEOUS | Status: AC
  Administered 2020-12-17: 1800 mg via SUBCUTANEOUS
  Filled 2020-12-17: qty 15

## 2020-12-17 MED ORDER — DEXAMETHASONE 4 MG PO TABS
ORAL_TABLET | ORAL | Status: AC
  Filled 2020-12-17: qty 5

## 2020-12-17 MED ORDER — ACETAMINOPHEN 325 MG PO TABS
ORAL_TABLET | ORAL | Status: AC
  Filled 2020-12-17: qty 2

## 2020-12-17 MED ORDER — DEXAMETHASONE 4 MG PO TABS
20.0000 mg | ORAL_TABLET | Freq: Once | ORAL | Status: AC
Start: 2020-12-17 — End: 2020-12-17
  Administered 2020-12-17: 20 mg via ORAL

## 2020-12-17 MED ORDER — ACETAMINOPHEN 325 MG PO TABS
650.0000 mg | ORAL_TABLET | Freq: Once | ORAL | Status: AC
  Administered 2020-12-17: 650 mg via ORAL

## 2020-12-17 MED ORDER — DIPHENHYDRAMINE HCL 25 MG PO CAPS
ORAL_CAPSULE | ORAL | Status: AC
  Filled 2020-12-17: qty 1

## 2020-12-17 MED ORDER — DIPHENHYDRAMINE HCL 25 MG PO CAPS
25.0000 mg | ORAL_CAPSULE | Freq: Once | ORAL | Status: AC
  Administered 2020-12-17: 25 mg via ORAL

## 2020-12-17 NOTE — Progress Notes (Signed)
[facility] Telephone:([phone]) [phone]   Fax:(336) [phone]  PROGRESS NOTE  Patient Care Team: [name], MD as PCP - General (Family Medicine) [name], MD as Consulting Physician (Physical Medicine and Rehabilitation) Donia Guiles Lavon Paganini, PA-C as Physician Assistant (Physical Medicine and Rehabilitation) [name], MD as Consulting Physician (Hematology and Oncology)  Hematological/Oncological History # IgG Lambda Multiple Myeloma, Relapsed (ISS Stage II) 1) 06/2010: initial diagnosis of Multiple Myeloma after T8 compression fracture. Treated with Velcade/Revlimid/Dexamethasone and achieved a complete remission 2) Velcade was discontinued in September 2012 and that Revlimid and Decadron were discontinued in March 2013. 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 4) Followed by Dr. Alvy Bimler, last clinic visit 10/09/2019. At that time there was concern for relapse of his multiple myeloma.  5) Patient requested transfer to different provider after misunderstanding regarding imaging studies 6) 12/17/2019: transfer care to Dr. Lorenso Courier  7) 01/09/2020: Cycle 1 Day 1 of Dara/Velcade/Dex 8) 01/21/2020: presented as urgent visit for diarrhea and dehydration. Holding chemotherapy scheduled for 01/23/2020. 9) 01/30/2020: Resume dara/velcade/dex after resolution of diarrhea.  10) 02/13/2020: restaging labs show M protein 0.8, Kappa 4.5, lamba 17.2, ratio 0.26, urine M protein 53 (7.1%). All MM labs indicate improvement.  11) 03/10/2020: Cycle 4 Day 1 of Dara/Velcade/Dex. Transition to q 3 week daratumumab.  12) 06/04/2020:  Cycle 8 Day 1 of Dara/Velcade/Dex 13) 06/25/2020:  Cycle 9 Day 1 of Dara/Velcade/Dex 14) 07/22/2020: Cycle 10 Day 1 of  Dara//Dex 15) 08/18/2020: Cycle 11 Day 1 of Dara//Dex 16) 09/23/2020: Cycle 12 Day 1 of Dara//Dex 17) 10/22/2020: Cycle 13 Day 1 of Dara/Dex  18)  11/19/2020: Cycle 14 Day 1 of Dara/Dex  19) 12/17/2020: Cycle 15 Day 1 of Dara/Dex   Interval History:  [name] 65 y.o. male with medical history significant for  IgG Lambda Multiple Myeloma who presents for a follow up visit. The patient's last visit was on 11/19/2020 with Dr. Irene Limbo. In the interim he has had no changes in his health status.   On exam today [name] reports he has been well overall in the interim since our last visit.  He reports that he has been having some shoulder pain in his left shoulder which he thinks may have been due to pushing and pulling of his wheelchair.  He was advised to take Tylenol and rest that arm.  He notes that he does tend to exert himself when doing "[employer] at his facility".  He notes that he has noticed some greenish discharge from his rectum which occurs on occasion but has happened consistently for over the last week.  He notes that a number of his facility has collected this for culture.  He also notes a possible increase in his urine output.Marland Kitchen He denies any fevers, chills, sweats, shortness of breath, chest pain or cough. He has no other complaints. A full 10 point ROS is listed below  MEDICAL HISTORY:  Past Medical History:  Diagnosis Date  . Adrenal insufficiency (HCC)    on chronic dexamethasone  . Anemia   . Cancer ([place])   . Coagulopathy ([place])    on xeralto/ s/p DVT while on coumadin,  IVC in place  . Diabetes mellitus without complication ([place])    type 2  . Gross hematuria 7/14   post foley cath procedure  . History of blood transfusion 7/14  . Multiple myeloma  thoracic T8 with paraplegia s/p resection- on chemo at visit 10/13/10  . Multiple myeloma   . Multiple myeloma without mention of remission   . Neurogenic bladder   . Neurogenic bowel   . Paraplegia ([place])   . Partial small bowel obstruction ([facility]) during dec 2011 admission    SURGICAL HISTORY: Past Surgical History:  Procedure Laterality Date  . COLONOSCOPY WITH  PROPOFOL N/A 04/12/2017   Procedure: COLONOSCOPY WITH PROPOFOL;  Surgeon: Irene Shipper, MD;  Location: [facility];  Service: Endoscopy;  Laterality: N/A;  . COLONOSCOPY WITH PROPOFOL N/A 04/19/2017   Procedure: COLONOSCOPY WITH PROPOFOL;  Surgeon: Yetta Flock, MD;  Location: [facility];  Service: Gastroenterology;  Laterality: N/A;  . COLOSTOMY  07/20/2011   Procedure: COLOSTOMY;  Surgeon: Judieth Keens, DO;  Location: [facility] OR;  Service: General;;  . COLOSTOMY REVISION  07/20/2011   Procedure: COLON RESECTION SIGMOID;  Surgeon: Judieth Keens, DO;  Location: [facility];  Service: General;;  . CYSTOSCOPY N/A 04/04/2013   Procedure: CYSTOSCOPY WITH LITHALOPAXY;  Surgeon: Alexis Frock, MD;  Location: [facility] OR[facility];  Service: Urology;  Laterality: N/A;  . INSERTION OF SUPRAPUBIC CATHETER N/A 04/04/2013   Procedure: INSERTION OF SUPRAPUBIC CATHETER;  Surgeon: Alexis Frock, MD;  Location: [facility];  Service: Urology;  Laterality: N/A;  . LAPAROTOMY  07/20/2011   Procedure: EXPLORATORY LAPAROTOMY;  Surgeon: Judieth Keens, DO;  Location: [facility];  Service: General;  Laterality: N/A;  . myeloma thoracic T8 with parpaplegia s/p thoracotomy and thoracic T7-9 cage placement on Dec 26th 2011  07/18/10    SOCIAL HISTORY: Social History   Socioeconomic History  . Marital status: Married    Spouse name: Not on file  . Number of children: Not on file  . Years of education: Not on file  . Highest education level: Not on file  Occupational History  . Not on file  Tobacco Use  . Smoking status: Never Smoker  . Smokeless tobacco: Never Used  Vaping Use  . Vaping Use: Never used  Substance and Sexual Activity  . Alcohol use: No  . Drug use: No  . Sexual activity: Never  Other Topics Concern  . Not on file  Social History Narrative  . Not on file   Social Determinants of Health   Financial Resource Strain: Not on file  Food Insecurity: Not on file  Transportation Needs: Not on file   Physical Activity: Not on file  Stress: Not on file  Social Connections: Not on file  Intimate Partner Violence: Not on file    FAMILY HISTORY: Family History  Problem Relation Age of Onset  . Ovarian cancer Mother   . Diabetes Father     ALLERGIES:  is allergic to feraheme [ferumoxytol].  MEDICATIONS:  Current Outpatient Medications  Medication Sig Dispense Refill  . acyclovir (ZOVIRAX) 400 MG tablet Take 1 tablet (400 mg total) by mouth 2 (two) times daily. 60 tablet 2  . atorvastatin (LIPITOR) 10 MG tablet Take 10 mg by mouth daily.    . baclofen (LIORESAL) 20 MG tablet Take 20 mg by mouth 2 (two) times daily.    . diazepam (DIASTAT ACUDIAL) 10 MG GEL SMARTSIG:By Mouth    . dorzolamide (TRUSOPT) 2 % ophthalmic solution 1 drop 2 (two) times daily.    . iron polysaccharides (NU-IRON) 150 MG capsule Take 1 capsule (150 mg total) by mouth daily. 30 capsule 0  . latanoprost (XALATAN) 0.005 % ophthalmic solution Place 1 drop into  both eyes at bedtime.    . ondansetron (ZOFRAN) 8 MG tablet Take 1 tablet (8 mg total) by mouth every 8 (eight) hours as needed for nausea or vomiting. 30 tablet 0  . rivaroxaban (XARELTO) 10 MG TABS tablet Take 1 tablet (10 mg total) by mouth daily with supper. 30 tablet 9  . Skin Protectants, Misc. (EUCERIN) cream Apply 1 application topically 2 (two) times daily as needed for dry skin.     Marland Kitchen zinc oxide (BALMEX) 11.3 % CREA cream Apply 1 application topically 2 (two) times daily.     No current facility-administered medications for this visit.   Facility-Administered Medications Ordered in Other Visits  Medication Dose Route Frequency Provider Last Rate Last Admin  . daratumumab-hyaluronidase-fihj (DARZALEX FASPRO) 1800-30000 MG-UT/15ML chemo SQ injection 1,800 mg  1,800 mg Subcutaneous Once Narda Rutherford T IV, MD      . sodium chloride flush (NS) 0.9 % injection 10 mL  10 mL Intravenous PRN Alvy Bimler, Ni, MD        REVIEW OF SYSTEMS:   Constitutional: (  - ) fevers, ( - )  chills , ( - ) night sweats Eyes: ( - ) blurriness of vision, ( - ) double vision, ( - ) watery eyes Ears, nose, mouth, throat, and face: ( - ) mucositis, ( - ) sore throat Respiratory: ( - ) cough, ( - ) dyspnea, ( - ) wheezes Cardiovascular: ( - ) palpitation, ( - ) chest discomfort, ( - ) lower extremity swelling Gastrointestinal:  ( - ) nausea, ( - ) heartburn, ( - ) change in bowel habits Skin: ( - ) abnormal skin rashes Lymphatics: ( - ) new lymphadenopathy, ( - ) easy bruising Neurological: ( - ) numbness, ( - ) tingling, ( - ) new weaknesses Behavioral/Psych: ( - ) mood change, ( - ) new changes  All other systems were reviewed with the patient and are negative.  PHYSICAL EXAMINATION: ECOG PERFORMANCE STATUS: paraplegic.   Vitals:   12/17/20 1024  BP: 122/79  Pulse: 94  Resp: 20  Temp: 98 F (36.7 C)  SpO2: 100%   Filed Weights   12/17/20 1029  Weight: 244 lb 14.4 oz (111.1 kg)    GENERAL: well appearing middle aged [place] American male alert, no distress and comfortable SKIN: skin color, texture, turgor are normal, no rashes or significant lesions EYES: conjunctiva are pink and non-injected, sclera clear LUNGS: clear to auscultation and percussion with normal breathing effort HEART: regular rate & rhythm and no murmurs and no lower extremity edema Musculoskeletal: no cyanosis of digits and no clubbing  PSYCH: alert & oriented x 3, fluent speech NEURO: paraplegic, no use of LE bilaterally.   LABORATORY DATA:  I have reviewed the data as listed CBC Latest Ref Rng & Units 12/17/2020 11/19/2020 10/22/2020  WBC 4.0 - 10.5 K/uL 7.2 7.5 7.4  Hemoglobin 13.0 - 17.0 g/dL 12.7(L) 13.1 12.6(L)  Hematocrit 39.0 - 52.0 % 36.3(L) 38.2(L) 36.7(L)  Platelets 150 - 400 K/uL 347 334 356    CMP Latest Ref Rng & Units 12/17/2020 11/19/2020 10/22/2020  Glucose 70 - 99 mg/dL 145(H) 109(H) 107(H)  BUN 8 - 23 mg/dL 11 13 8   Creatinine 0.61 - 1.24 mg/dL 0.80 0.83 0.74   Sodium 135 - 145 mmol/L 146(H) 144 145  Potassium 3.5 - 5.1 mmol/L 3.6 3.8 3.6  Chloride 98 - 111 mmol/L 109 108 107  CO2 22 - 32 mmol/L 25 27 26   Calcium 8.9 -  10.3 mg/dL 9.5 9.5 8.9  Total Protein 6.5 - 8.1 g/dL 6.6 6.8 6.5  Total Bilirubin 0.3 - 1.2 mg/dL 0.5 0.4 0.5  Alkaline Phos 38 - 126 U/L 109 117 105  AST 15 - 41 U/L 23 18 22   ALT 0 - 44 U/L 38 24 33    Lab Results  Component Value Date   MPROTEIN 0.3 (H) 10/22/2020   MPROTEIN 0.3 (H) 09/23/2020   MPROTEIN 0.2 (H) 08/18/2020   Lab Results  Component Value Date   KPAFRELGTCHN 6.8 10/22/2020   KPAFRELGTCHN 6.8 09/23/2020   KPAFRELGTCHN 8.3 08/18/2020   LAMBDASER 7.9 10/22/2020   LAMBDASER 5.9 09/23/2020   LAMBDASER 8.0 08/18/2020   KAPLAMBRATIO 0.86 10/22/2020   KAPLAMBRATIO 1.15 09/23/2020   KAPLAMBRATIO 1.04 08/18/2020    RADIOGRAPHIC STUDIES: No results found.  ASSESSMENT & PLAN [name] 65 y.o. male with medical history significant for  IgG Lambda Multiple Myeloma who presents for a follow up visit.  After review of the labs, discussion with the patient, and reviewed the imaging his findings are most consistent with a relapsed multiple myeloma.The patient has had excellent success before with treatment of his myeloma with Velcade, Revlimid, and dexamethasone.  Treatment regimen consists of bortezomib 1.73m/m2 subq (Day 1,8, and 15), daratumumab 1800 mg subq (day 1,8 and 15), and dexamethasone 20 mg PO (day 1, 8 and 15). After 3 cycles, daratumumab to be administered q 3 weeks (from cycles 4-8). After cycle 8, continue daratumumab q 4 weeks alone ([employer]ningMed 2016; [id number]  On exam today, Mr. MBurdinis stable without any significant changes to his health. Labs were reviewed today that show stable anemia with Hgb of 12.7. Patient will proceed with treatment today and return to the clinic prior to Cycle 14 Day 1.   # IgG Lambda Multiple Myeloma, Relapsed (ISS Stage II) --findings are most consistent  with relapsed multiple myeloma. Patient previously successfully treated with Velcade/Rev/Dex --current plan is Daratumumab/Velcade/Dex  (started 01/09/2020). On 07/02/2020 he transitioned to monthly daratumumab alone. Continue q 4 week dara. --recollect restaging MM labs monthly to include MM panel, SFLC. Additionally will collect LDH and beta 2 microglobulin.  --will have patient evaluated by dentistry prior to restarting Zometa therapy. He had an episode of osteonecrosis in the past, risk of restarting may outweight benefit.  --return for start of Cycle 16 on 01/14/2021.    #History of DVT --He had placement of IVC filter, remains on Xarelto. --Due to poor mobility, and lack of bleeding complications, I recommend he remain on Xarelto indefinitely. --caution if Plt count were to drop <50  # Supportive Care -- provided patient with an albuterol inhaler (for use with daratumumab) --acyclovir 40542mBID for VZV prophylaxis --zofran 42m89m8H PRN for nausea/vomiting  --Zometa can be considered after dental clearance, though with his prior episode of osteonecrosis the risk may outweigh the benefits. --patient recieved pretreatment RBC phenotype  No orders of the defined types were placed in this encounter.  All questions were answered. The patient knows to call the clinic with any problems, questions or concerns.  A total of more than 30 minutes were spent on this encounter and over half of that time was spent on counseling and coordination of care as outlined above.   JohLedell Peoples  JohLedell PeoplesD Department of Hematology/Oncology [facility] Aphone: [phone]: [phone]: [email or web address]  12/17/2020 11:47 AM   Literature Support:  PalRandal Buba  A, Chanan-Khan A, Weisel K, Nooka AK, Masszi T, Beksac M, Lex[place] I, Lasara V, Munder M, [place], Mark TM, Qi M, Schecter J, [place], Qin X, Deraedt W, Ahmadi T, Spencer A,  Sonneveld P; [employer] Investig[employer]. Daratumumab, Bortezomib, and Dexamethasone for Multiple Myeloma. [employer] Med. 2016 Aug 25;375(8):754-66  --among patients with relapsed or relapsed and refractory multiple myeloma, daratumumab in combination with bortezomib and dexamethasone resulted in significantly longer progression-free survival than bortezomib and dexamethasone alone and was associated with infusion-related reactions and higher rates of thrombocytopenia and neutropenia than bortezomib and dexamethasone alone.

## 2020-12-17 NOTE — Patient Instructions (Signed)
[facility]    Discharge Instructions: Thank you for choosing [facility] to provide your oncology and hematology care.   If you have a lab appointment with the [facility], please go directly to the [facility] and check in at the registration area.   Wear comfortable clothing and clothing appropriate for easy access to any Portacath or PICC line.   We strive to give you quality time with your provider. You may need to reschedule your appointment if you arrive late (15 or more minutes).  Arriving late affects you and other patients whose appointments are after yours.  Also, if you miss three or more appointments without notifying the office, you may be dismissed from the clinic at the provider's discretion.      For prescription refill requests, have your pharmacy contact our office and allow 72 hours for refills to be completed.    Today you received the following chemotherapy and/or immunotherapy agents: daratumumab/hyaluronidase.      To help prevent nausea and vomiting after your treatment, we encourage you to take your nausea medication as directed.  BELOW ARE SYMPTOMS THAT SHOULD BE REPORTED IMMEDIATELY: . *FEVER GREATER THAN 100.4 F (38 C) OR HIGHER . *CHILLS OR SWEATING . *NAUSEA AND VOMITING THAT IS NOT CONTROLLED WITH YOUR NAUSEA MEDICATION . *UNUSUAL SHORTNESS OF BREATH . *UNUSUAL BRUISING OR BLEEDING . *URINARY PROBLEMS (pain or burning when urinating, or frequent urination) . *BOWEL PROBLEMS (unusual diarrhea, constipation, pain near the anus) . TENDERNESS IN MOUTH AND THROAT WITH OR WITHOUT PRESENCE OF ULCERS (sore throat, sores in mouth, or a toothache) . UNUSUAL RASH, SWELLING OR PAIN  . UNUSUAL VAGINAL DISCHARGE OR ITCHING   Items with * indicate a potential emergency and should be followed up as soon as possible or go to the Emergency Department if any problems should occur.  Please show the CHEMOTHERAPY ALERT CARD or  IMMUNOTHERAPY ALERT CARD at check-in to the Emergency Department and triage nurse.  Should you have questions after your visit or need to cancel or reschedule your appointment, please contact [facility]  Dept: [phone]  and follow the prompts.  Office hours are 8:00 a.m. to 4:30 p.m. Monday - Friday. Please note that voicemails left after 4:00 p.m. may not be returned until the following business day.  We are closed weekends and major holidays. You have access to a nurse at all times for urgent questions. Please call the main number to the clinic Dept: [phone] and follow the prompts.   For any non-urgent questions, you may also contact your provider using MyChart. We now offer e-Visits for anyone 86 and older to request care online for non-urgent symptoms. For details visit [email or web address].   Also download the MyChart app! Go to the app store, search "MyChart", open the app, select [facility], and log in with your MyChart username and password.  Due to Covid, a mask is required upon entering the hospital/clinic. If you do not have a mask, one will be given to you upon arrival. For doctor visits, patients may have 1 support person aged 16 or older with them. For treatment visits, patients cannot have anyone with them due to current Covid guidelines and our immunocompromised population.

## 2020-12-21 LAB — KAPPA/LAMBDA LIGHT CHAINS
Kappa free light chain: 7.1 mg/L (ref 3.3–19.4)
Kappa, lambda light chain ratio: 0.83 (ref 0.26–1.65)
Lambda free light chains: 8.6 mg/L (ref 5.7–26.3)

## 2020-12-22 ENCOUNTER — Telehealth: Payer: Self-pay | Admitting: Internal Medicine

## 2020-12-22 ENCOUNTER — Telehealth: Payer: Self-pay | Admitting: Hematology and Oncology

## 2020-12-22 LAB — MULTIPLE MYELOMA PANEL, SERUM
Albumin SerPl Elph-Mcnc: 3.5 g/dL (ref 2.9–4.4)
Albumin/Glob SerPl: 1.4 (ref 0.7–1.7)
Alpha 1: 0.2 g/dL (ref 0.0–0.4)
Alpha2 Glob SerPl Elph-Mcnc: 0.9 g/dL (ref 0.4–1.0)
B-Globulin SerPl Elph-Mcnc: 0.9 g/dL (ref 0.7–1.3)
Gamma Glob SerPl Elph-Mcnc: 0.6 g/dL (ref 0.4–1.8)
Globulin, Total: 2.6 g/dL (ref 2.2–3.9)
IgA: 38 mg/dL — ABNORMAL LOW (ref 61–437)
IgG (Immunoglobin G), Serum: 748 mg/dL (ref 603–1613)
IgM (Immunoglobulin M), Srm: 17 mg/dL — ABNORMAL LOW (ref 20–172)
M Protein SerPl Elph-Mcnc: 0.4 g/dL — ABNORMAL HIGH
Total Protein ELP: 6.1 g/dL (ref 6.0–8.5)

## 2020-12-22 NOTE — Telephone Encounter (Signed)
Scheduled per los. Called [facility] with appts. Mailed printout to patient

## 2021-01-14 ENCOUNTER — Other Ambulatory Visit: Payer: Medicare (Managed Care)

## 2021-01-14 ENCOUNTER — Ambulatory Visit: Payer: Medicare (Managed Care) | Admitting: Hematology and Oncology

## 2021-01-14 ENCOUNTER — Ambulatory Visit: Payer: Medicare (Managed Care)

## 2021-01-17 ENCOUNTER — Inpatient Hospital Stay: Payer: Medicare (Managed Care)

## 2021-01-17 ENCOUNTER — Other Ambulatory Visit: Payer: Self-pay

## 2021-01-17 ENCOUNTER — Inpatient Hospital Stay: Payer: Medicare (Managed Care) | Attending: Hematology and Oncology | Admitting: Hematology and Oncology

## 2021-01-17 ENCOUNTER — Other Ambulatory Visit: Payer: Medicare (Managed Care)

## 2021-01-17 VITALS — BP 126/77 | HR 95 | Temp 98.7°F | Resp 17 | Ht 68.0 in | Wt 246.6 lb

## 2021-01-17 DIAGNOSIS — Z95828 Presence of other vascular implants and grafts: Secondary | ICD-10-CM

## 2021-01-17 DIAGNOSIS — D649 Anemia, unspecified: Secondary | ICD-10-CM | POA: Diagnosis not present

## 2021-01-17 DIAGNOSIS — Z7901 Long term (current) use of anticoagulants: Secondary | ICD-10-CM | POA: Diagnosis not present

## 2021-01-17 DIAGNOSIS — C9002 Multiple myeloma in relapse: Secondary | ICD-10-CM

## 2021-01-17 DIAGNOSIS — Z5111 Encounter for antineoplastic chemotherapy: Secondary | ICD-10-CM

## 2021-01-17 DIAGNOSIS — Z79899 Other long term (current) drug therapy: Secondary | ICD-10-CM | POA: Diagnosis not present

## 2021-01-17 DIAGNOSIS — Z86718 Personal history of other venous thrombosis and embolism: Secondary | ICD-10-CM | POA: Insufficient documentation

## 2021-01-17 DIAGNOSIS — M87180 Osteonecrosis due to drugs, jaw: Secondary | ICD-10-CM | POA: Diagnosis not present

## 2021-01-17 DIAGNOSIS — Z7189 Other specified counseling: Secondary | ICD-10-CM

## 2021-01-17 DIAGNOSIS — Z5112 Encounter for antineoplastic immunotherapy: Secondary | ICD-10-CM | POA: Diagnosis present

## 2021-01-17 DIAGNOSIS — E119 Type 2 diabetes mellitus without complications: Secondary | ICD-10-CM | POA: Insufficient documentation

## 2021-01-17 LAB — CMP (CANCER CENTER ONLY)
ALT: 21 U/L (ref 0–44)
AST: 17 U/L (ref 15–41)
Albumin: 3.5 g/dL (ref 3.5–5.0)
Alkaline Phosphatase: 100 U/L (ref 38–126)
Anion gap: 10 (ref 5–15)
BUN: 8 mg/dL (ref 8–23)
CO2: 25 mmol/L (ref 22–32)
Calcium: 9.4 mg/dL (ref 8.9–10.3)
Chloride: 109 mmol/L (ref 98–111)
Creatinine: 0.8 mg/dL (ref 0.61–1.24)
GFR, Estimated: >60 mL/min (ref >60)
Glucose, Bld: 98 mg/dL (ref 70–99)
Potassium: 3.9 mmol/L (ref 3.5–5.1)
Sodium: 144 mmol/L (ref 135–145)
Total Bilirubin: 0.5 mg/dL (ref 0.3–1.2)
Total Protein: 7 g/dL (ref 6.5–8.1)

## 2021-01-17 LAB — CBC WITH DIFFERENTIAL (CANCER CENTER ONLY)
Abs Immature Granulocytes: 0.03 10*3/uL (ref 0.00–0.07)
Basophils Absolute: 0 10*3/uL (ref 0.0–0.1)
Basophils Relative: 1 %
Eosinophils Absolute: 0.4 10*3/uL (ref 0.0–0.5)
Eosinophils Relative: 5 %
HCT: 37.3 % — ABNORMAL LOW (ref 39.0–52.0)
Hemoglobin: 12.9 g/dL — ABNORMAL LOW (ref 13.0–17.0)
Immature Granulocytes: 0 %
Lymphocytes Relative: 16 %
Lymphs Abs: 1.2 10*3/uL (ref 0.7–4.0)
MCH: 26.7 pg (ref 26.0–34.0)
MCHC: 34.6 g/dL (ref 30.0–36.0)
MCV: 77.2 fL — ABNORMAL LOW (ref 80.0–100.0)
Monocytes Absolute: 0.6 10*3/uL (ref 0.1–1.0)
Monocytes Relative: 9 %
Neutro Abs: 5.1 10*3/uL (ref 1.7–7.7)
Neutrophils Relative %: 69 %
Platelet Count: 351 10*3/uL (ref 150–400)
RBC: 4.83 MIL/uL (ref 4.22–5.81)
RDW: 15.9 % — ABNORMAL HIGH (ref 11.5–15.5)
WBC Count: 7.4 10*3/uL (ref 4.0–10.5)
nRBC: 0 % (ref 0.0–0.2)

## 2021-01-17 LAB — LACTATE DEHYDROGENASE: LDH: 108 U/L (ref 98–192)

## 2021-01-17 MED ORDER — DIPHENHYDRAMINE HCL 25 MG PO CAPS
ORAL_CAPSULE | ORAL | Status: AC
  Filled 2021-01-17: qty 1

## 2021-01-17 MED ORDER — ACETAMINOPHEN 325 MG PO TABS
ORAL_TABLET | ORAL | Status: AC
  Filled 2021-01-17: qty 2

## 2021-01-17 MED ORDER — DIPHENHYDRAMINE HCL 25 MG PO CAPS
25.0000 mg | ORAL_CAPSULE | Freq: Once | ORAL | Status: AC
  Administered 2021-01-17: 25 mg via ORAL

## 2021-01-17 MED ORDER — DEXAMETHASONE 4 MG PO TABS
ORAL_TABLET | ORAL | Status: AC
  Filled 2021-01-17: qty 5

## 2021-01-17 MED ORDER — DEXAMETHASONE 4 MG PO TABS
20.0000 mg | ORAL_TABLET | Freq: Once | ORAL | Status: AC
  Administered 2021-01-17: 20 mg via ORAL

## 2021-01-17 MED ORDER — DARATUMUMAB-HYALURONIDASE-FIHJ 1800-30000 MG-UT/15ML ~~LOC~~ SOLN
1800.0000 mg | Freq: Once | SUBCUTANEOUS | Status: AC
  Administered 2021-01-17: 1800 mg via SUBCUTANEOUS
  Filled 2021-01-17: qty 15

## 2021-01-17 MED ORDER — ACETAMINOPHEN 325 MG PO TABS
650.0000 mg | ORAL_TABLET | Freq: Once | ORAL | Status: AC
  Administered 2021-01-17: 650 mg via ORAL

## 2021-01-17 NOTE — Patient Instructions (Signed)
[facility]COL[facility]  Discharge Instructions: Thank you for choosing [facility] to provide your oncology and hematology care.   If you have a lab appointment with the [facility], please go directly to the [facility] and check in at the registration area.   Wear comfortable clothing and clothing appropriate for easy access to any Portacath or PICC line.   We strive to give you quality time with your provider. You may need to reschedule your appointment if you arrive late (15 or more minutes).  Arriving late affects you and other patients whose appointments are after yours.  Also, if you miss three or more appointments without notifying the office, you may be dismissed from the clinic at the provider's discretion.      For prescription refill requests, have your pharmacy contact our office and allow 72 hours for refills to be completed.    Today you received the following chemotherapy and/or immunotherapy agents : Darzalex Faspro     To help prevent nausea and vomiting after your treatment, we encourage you to take your nausea medication as directed.  BELOW ARE SYMPTOMS THAT SHOULD BE REPORTED IMMEDIATELY: *FEVER GREATER THAN 100.4 F (38 C) OR HIGHER *CHILLS OR SWEATING *NAUSEA AND VOMITING THAT IS NOT CONTROLLED WITH YOUR NAUSEA MEDICATION *UNUSUAL SHORTNESS OF BREATH *UNUSUAL BRUISING OR BLEEDING *URINARY PROBLEMS (pain or burning when urinating, or frequent urination) *BOWEL PROBLEMS (unusual diarrhea, constipation, pain near the anus) TENDERNESS IN MOUTH AND THROAT WITH OR WITHOUT PRESENCE OF ULCERS (sore throat, sores in mouth, or a toothache) UNUSUAL RASH, SWELLING OR PAIN  UNUSUAL VAGINAL DISCHARGE OR ITCHING   Items with * indicate a potential emergency and should be followed up as soon as possible or go to the Emergency Department if any problems should occur.  Please show the CHEMOTHERAPY ALERT CARD or IMMUNOTHERAPY ALERT CARD at  check-in to the Emergency Department and triage nurse.  Should you have questions after your visit or need to cancel or reschedule your appointment, please contact [facility]  Dept: [phone]  and follow the prompts.  Office hours are 8:00 a.m. to 4:30 p.m. Monday - Friday. Please note that voicemails left after 4:00 p.m. may not be returned until the following business day.  We are closed weekends and major holidays. You have access to a nurse at all times for urgent questions. Please call the main number to the clinic Dept: [phone] and follow the prompts.   For any non-urgent questions, you may also contact your provider using MyChart. We now offer e-Visits for anyone 31 and older to request care online for non-urgent symptoms. For details visit [email or web address].   Also download the MyChart app! Go to the app store, search "MyChart", open the app, select [facility], and log in with your MyChart username and password.  Due to Covid, a mask is required upon entering the hospital/clinic. If you do not have a mask, one will be given to you upon arrival. For doctor visits, patients may have 1 support person aged 69 or older with them. For treatment visits, patients cannot have anyone with them due to current Covid guidelines and our immunocompromised population.

## 2021-01-17 NOTE — Progress Notes (Signed)
[place] Telephone:([phone]) [phone]   [phone]) [phone]  PROGRESS NOTE  Patient Care Team: [name], MD as PCP - General (Family Medicine) [name], MD as Consulting Physician (Physical Medicine and Rehabilitation) Donia Guiles Lavon Paganini, PA-C as Physician Assistant (Physical Medicine and Rehabilitation) [name], MD as Consulting Physician (Hematology and Oncology)  Hematological/Oncological History # IgG Lambda Multiple Myeloma, Relapsed (ISS Stage II) 1) 06/2010: initial diagnosis of Multiple Myeloma after T8 compression fracture. Treated with Velcade/Revlimid/Dexamethasone and achieved a complete remission 2) Velcade was discontinued in September 2012 and that Revlimid and Decadron were discontinued in March 2013. 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 4) Followed by Dr. Alvy Bimler, last clinic visit 10/09/2019. At that time there was concern for relapse of his multiple myeloma.  5) Patient requested transfer to different provider after misunderstanding regarding imaging studies 6) 12/17/2019: transfer care to Dr. Lorenso Courier  7) 01/09/2020: Cycle 1 Day 1 of Dara/Velcade/Dex 8) 01/21/2020: presented as urgent visit for diarrhea and dehydration. Holding chemotherapy scheduled for 01/23/2020. 9) 01/30/2020: Resume dara/velcade/dex after resolution of diarrhea.  10) 02/13/2020: restaging labs show M protein 0.8, Kappa 4.5, lamba 17.2, ratio 0.26, urine M protein 53 (7.1%). All MM labs indicate improvement.  11) 03/10/2020: Cycle 4 Day 1 of Dara/Velcade/Dex. Transition to q 3 week daratumumab.  12) 06/04/2020:  Cycle 8 Day 1 of Dara/Velcade/Dex 13) 06/25/2020:  Cycle 9 Day 1 of Dara/Velcade/Dex 14) 07/22/2020: Cycle 10 Day 1 of  Dara//Dex 15) 08/18/2020: Cycle 11 Day 1 of Dara//Dex 16) 09/23/2020: Cycle 12 Day 1 of Dara//Dex 17) 10/22/2020: Cycle 13 Day 1 of Dara/Dex  18)  11/19/2020: Cycle 14 Day 1 of Dara/Dex  19) 12/17/2020: Cycle 15 Day 1 of Dara/Dex  20) 01/17/2021: Cycle 16 Day 1 of Dara/Dex   Interval History:  [name] 65 y.o. male with medical history significant for  IgG Lambda Multiple Myeloma who presents for a follow up visit. The patient's last visit was on 12/17/2020. In the interim he has had no changes in his health status.   On exam today [name] reports he has been well overall in the interim since our last visit.  He reports his energy levels are good and his appetite continues to be good.  He notes he is having "no trouble".  He does not have any numbness or tingling in his fingers or toes.  He says that the output from his ostomy and urostomy.  He denies any fevers, chills, sweats, shortness of breath, chest pain or cough. He has no other complaints. A full 10 point ROS is listed below  MEDICAL HISTORY:  Past Medical History:  Diagnosis Date   Adrenal insufficiency (HCC)    on chronic dexamethasone   Anemia    Cancer ([place])    Coagulopathy (HCC)    on xeralto/ s/p DVT while on coumadin,  IVC in place   Diabetes mellitus without complication ([place])    type 2   Gross hematuria 7/14   post foley cath procedure   History of blood transfusion 7/14   Multiple myeloma    thoracic T8 with paraplegia s/p resection- on chemo at visit 10/13/10   Multiple myeloma    Multiple myeloma without mention of remission    Neurogenic bladder    Neurogenic bowel    Paraplegia ([place])    Partial small bowel obstruction ([place]) during dec 2011 admission  SURGICAL HISTORY: Past Surgical History:  Procedure Laterality Date   COLONOSCOPY WITH PROPOFOL N/A 04/12/2017   Procedure: COLONOSCOPY WITH PROPOFOL;  Surgeon: Irene Shipper, MD;  Location: [facility];  Service: Endoscopy;  Laterality: N/A;   COLONOSCOPY WITH PROPOFOL N/A 04/19/2017   Procedure: COLONOSCOPY WITH PROPOFOL;  Surgeon: Yetta Flock, MD;  Location: [facility];  Service:  Gastroenterology;  Laterality: N/A;   COLOSTOMY  07/20/2011   Procedure: COLOSTOMY;  Surgeon: Judieth Keens, DO;  Location: [facility];  Service: General;;   COLOSTOMY REVISION  07/20/2011   Procedure: COLON RESECTION SIGMOID;  Surgeon: Judieth Keens, DO;  Location: [facility];  Service: General;;   CYSTOSCOPY N/A 04/04/2013   Procedure: CYSTOSCOPY WITH LITHALOPAXY;  Surgeon: Alexis Frock, MD;  Location: [facility];  Service: Urology;  Laterality: N/A;   INSERTION OF SUPRAPUBIC CATHETER N/A 04/04/2013   Procedure: INSERTION OF SUPRAPUBIC CATHETER;  Surgeon: Alexis Frock, MD;  Location: [facility];  Service: Urology;  Laterality: N/A;   LAPAROTOMY  07/20/2011   Procedure: EXPLORATORY LAPAROTOMY;  Surgeon: Judieth Keens, DO;  Location: [place];  Service: General;  Laterality: N/A;   myeloma thoracic T8 with parpaplegia s/p thoracotomy and thoracic T7-9 cage placement on Dec 26th 2011  07/18/10    SOCIAL HISTORY: Social History   Socioeconomic History   Marital status: Married    Spouse name: Not on file   Number of children: Not on file   Years of education: Not on file   Highest education level: Not on file  Occupational History   Not on file  Tobacco Use   Smoking status: Never   Smokeless tobacco: Never  Vaping Use   Vaping Use: Never used  Substance and Sexual Activity   Alcohol use: No   Drug use: No   Sexual activity: Never  Other Topics Concern   Not on file  Social History Narrative   Not on file   Social Determinants of Health   Financial Resource Strain: Not on file  Food Insecurity: Not on file  Transportation Needs: Not on file  Physical Activity: Not on file  Stress: Not on file  Social Connections: Not on file  Intimate Partner Violence: Not on file    FAMILY HISTORY: Family History  Problem Relation Age of Onset   Ovarian cancer Mother    Diabetes Father     ALLERGIES:  is allergic to feraheme [ferumoxytol].  MEDICATIONS:  Current Outpatient  Medications  Medication Sig Dispense Refill   acyclovir (ZOVIRAX) 400 MG tablet Take 1 tablet (400 mg total) by mouth 2 (two) times daily. 60 tablet 2   atorvastatin (LIPITOR) 10 MG tablet Take 10 mg by mouth daily.     baclofen (LIORESAL) 20 MG tablet Take 20 mg by mouth 2 (two) times daily.     diazepam (DIASTAT ACUDIAL) 10 MG GEL SMARTSIG:By Mouth     dorzolamide (TRUSOPT) 2 % ophthalmic solution 1 drop 2 (two) times daily.     iron polysaccharides (NU-IRON) 150 MG capsule Take 1 capsule (150 mg total) by mouth daily. 30 capsule 0   latanoprost (XALATAN) 0.005 % ophthalmic solution Place 1 drop into both eyes at bedtime.     ondansetron (ZOFRAN) 8 MG tablet Take 1 tablet (8 mg total) by mouth every 8 (eight) hours as needed for nausea or vomiting. 30 tablet 0   rivaroxaban (XARELTO) 10 MG TABS tablet Take 1 tablet (10 mg total) by mouth daily with supper. 30 tablet 9  Skin Protectants, Misc. (EUCERIN) cream Apply 1 application topically 2 (two) times daily as needed for dry skin.      zinc oxide (BALMEX) 11.3 % CREA cream Apply 1 application topically 2 (two) times daily.     No current facility-administered medications for this visit.   Facility-Administered Medications Ordered in Other Visits  Medication Dose Route Frequency Provider Last Rate Last Admin   sodium chloride flush (NS) 0.9 % injection 10 mL  10 mL Intravenous PRN Alvy Bimler, Ni, MD        REVIEW OF SYSTEMS:   Constitutional: ( - ) fevers, ( - )  chills , ( - ) night sweats Eyes: ( - ) blurriness of vision, ( - ) double vision, ( - ) watery eyes Ears, nose, mouth, throat, and face: ( - ) mucositis, ( - ) sore throat Respiratory: ( - ) cough, ( - ) dyspnea, ( - ) wheezes Cardiovascular: ( - ) palpitation, ( - ) chest discomfort, ( - ) lower extremity swelling Gastrointestinal:  ( - ) nausea, ( - ) heartburn, ( - ) change in bowel habits Skin: ( - ) abnormal skin rashes Lymphatics: ( - ) new lymphadenopathy, ( - ) easy  bruising Neurological: ( - ) numbness, ( - ) tingling, ( - ) new weaknesses Behavioral/Psych: ( - ) mood change, ( - ) new changes  All other systems were reviewed with the patient and are negative.  PHYSICAL EXAMINATION: ECOG PERFORMANCE STATUS: paraplegic.   Vitals:   01/17/21 1104  BP: 126/77  Pulse: 95  Resp: 17  Temp: 98.7 F (37.1 C)  SpO2: 100%    Filed Weights   01/17/21 1104  Weight: 246 lb 9.6 oz (111.9 kg)     GENERAL: well appearing middle aged [place] American male alert, no distress and comfortable SKIN: skin color, texture, turgor are normal, no rashes or significant lesions EYES: conjunctiva are pink and non-injected, sclera clear LUNGS: clear to auscultation and percussion with normal breathing effort HEART: regular rate & rhythm and no murmurs and no lower extremity edema Musculoskeletal: no cyanosis of digits and no clubbing  PSYCH: alert & oriented x 3, fluent speech NEURO: paraplegic, no use of LE bilaterally.   LABORATORY DATA:  I have reviewed the data as listed CBC Latest Ref Rng & Units 01/17/2021 12/17/2020 11/19/2020  WBC 4.0 - 10.5 K/uL 7.4 7.2 7.5  Hemoglobin 13.0 - 17.0 g/dL 12.9(L) 12.7(L) 13.1  Hematocrit 39.0 - 52.0 % 37.3(L) 36.3(L) 38.2(L)  Platelets 150 - 400 K/uL 351 347 334    CMP Latest Ref Rng & Units 01/17/2021 12/17/2020 11/19/2020  Glucose 70 - 99 mg/dL 98 145(H) 109(H)  BUN 8 - 23 mg/dL 8 11 13   Creatinine 0.61 - 1.24 mg/dL 0.80 0.80 0.83  Sodium 135 - 145 mmol/L 144 146(H) 144  Potassium 3.5 - 5.1 mmol/L 3.9 3.6 3.8  Chloride 98 - 111 mmol/L 109 109 108  CO2 22 - 32 mmol/L 25 25 27   Calcium 8.9 - 10.3 mg/dL 9.4 9.5 9.5  Total Protein 6.5 - 8.1 g/dL 7.0 6.6 6.8  Total Bilirubin 0.3 - 1.2 mg/dL 0.5 0.5 0.4  Alkaline Phos 38 - 126 U/L 100 109 117  AST 15 - 41 U/L 17 23 18   ALT 0 - 44 U/L 21 38 24    Lab Results  Component Value Date   MPROTEIN 0.4 (H) 12/17/2020   MPROTEIN 0.3 (H) 10/22/2020   MPROTEIN 0.3 (H)  09/23/2020  Lab Results  Component Value Date   KPAFRELGTCHN 7.1 12/17/2020   KPAFRELGTCHN 6.8 10/22/2020   KPAFRELGTCHN 6.8 09/23/2020   LAMBDASER 8.6 12/17/2020   LAMBDASER 7.9 10/22/2020   LAMBDASER 5.9 09/23/2020   KAPLAMBRATIO 0.83 12/17/2020   KAPLAMBRATIO 0.86 10/22/2020   KAPLAMBRATIO 1.15 09/23/2020    RADIOGRAPHIC STUDIES: No results found.  ASSESSMENT & PLAN [name] 65 y.o. male with medical history significant for  IgG Lambda Multiple Myeloma who presents for a follow up visit.  After review of the labs, discussion with the patient, and reviewed the imaging his findings are most consistent with a relapsed multiple myeloma.The patient has had excellent success before with treatment of his myeloma with Velcade, Revlimid, and dexamethasone.  Treatment regimen consists of bortezomib 1.29m/m2 subq (Day 1,8, and 15), daratumumab 1800 mg subq (day 1,8 and 15), and dexamethasone 20 mg PO (day 1, 8 and 15). After 3 cycles, daratumumab to be administered q 3 weeks (from cycles 4-8). After cycle 8, continue daratumumab q 4 weeks alone ([facility] [employer] 2016; [id number]  On exam today, [name] stable without any significant changes to his health. Labs were reviewed today that show stable anemia with Hgb of 12.9. Patient will proceed with treatment today and return to the clinic prior to Cycle 17 Day 1.   # IgG Lambda Multiple Myeloma, Relapsed (ISS Stage II) --findings are most consistent with relapsed multiple myeloma. Patient previously successfully treated with Velcade/Rev/Dex --current plan is Daratumumab/Velcade/Dex  (started 01/09/2020). On 07/02/2020 he transitioned to monthly daratumumab alone. Continue q 4 week dara. --recollect restaging MM labs monthly to include MM panel, SFLC. Additionally will collect LDH and beta 2 microglobulin.  --return for start of Cycle 17 on 02/10/2021.    #History of DVT --He had placement of IVC filter, remains on Xarelto. --Due to  poor mobility, and lack of bleeding complications, I recommend he remain on Xarelto indefinitely. --caution if Plt count were to drop <50  # Supportive Care -- provided patient with an albuterol inhaler (for use with daratumumab) --acyclovir 4054mBID for VZV prophylaxis --zofran 41m55m8H PRN for nausea/vomiting  --Zometa can be considered after dental clearance, though with his prior episode of osteonecrosis the risk may outweigh the benefits. --patient recieved pretreatment RBC phenotype  No orders of the defined types were placed in this encounter.  All questions were answered. The patient knows to call the clinic with any problems, questions or concerns.  A total of more than 30 minutes were spent on this encounter and over half of that time was spent on counseling and coordination of care as outlined above.   JohLedell PeoplesD Department of Hematology/Oncology [facility] Ltdone: [phone]: [phone]: [email or web address]  01/17/2021 2:15 PM   Literature Support:  PalArchie Endoeisel K, Nooka AK, Masszi T, BekHamlinpiConejos HunKemp Munder M, MatMount Pleasantark TM, Qi M, Schecter J, Amin H, Qin X, Deraedt W, Ahmadi T, Spencer A, Sonneveld P; [employer] Investigators. Daratumumab, Bortezomib, and Dexamethasone for Multiple Myeloma. N EAlta Corningd. 2016 Aug 25;375(8):754-66  --among patients with relapsed or relapsed and refractory multiple myeloma, daratumumab in combination with bortezomib and dexamethasone resulted in significantly longer progression-free survival than bortezomib and dexamethasone alone and was associated with infusion-related reactions and higher rates of thrombocytopenia and neutropenia than bortezomib and dexamethasone alone.

## 2021-01-18 LAB — KAPPA/LAMBDA LIGHT CHAINS
Kappa free light chain: 7.3 mg/L (ref 3.3–19.4)
Kappa, lambda light chain ratio: 0.7 (ref 0.26–1.65)
Lambda free light chains: 10.5 mg/L (ref 5.7–26.3)

## 2021-01-19 ENCOUNTER — Telehealth: Payer: Self-pay | Admitting: *Deleted

## 2021-01-19 LAB — MULTIPLE MYELOMA PANEL, SERUM
Albumin SerPl Elph-Mcnc: 3.6 g/dL (ref 2.9–4.4)
Albumin/Glob SerPl: 1.3 (ref 0.7–1.7)
Alpha 1: 0.2 g/dL (ref 0.0–0.4)
Alpha2 Glob SerPl Elph-Mcnc: 1 g/dL (ref 0.4–1.0)
B-Globulin SerPl Elph-Mcnc: 0.9 g/dL (ref 0.7–1.3)
Gamma Glob SerPl Elph-Mcnc: 0.7 g/dL (ref 0.4–1.8)
Globulin, Total: 2.8 g/dL (ref 2.2–3.9)
IgA: 44 mg/dL — ABNORMAL LOW (ref 61–437)
IgG (Immunoglobin G), Serum: 845 mg/dL (ref 603–1613)
IgM (Immunoglobulin M), Srm: 19 mg/dL — ABNORMAL LOW (ref 20–172)
M Protein SerPl Elph-Mcnc: 0.5 g/dL — ABNORMAL HIGH
Total Protein ELP: 6.4 g/dL (ref 6.0–8.5)

## 2021-01-19 NOTE — Telephone Encounter (Signed)
Received call from pt's NP @ [facility]. She is concerned about pt's CBC done today @ [facility]. She states his WBC and ANC is elevated. She wanted to know if this might be related to his treatment for mulitple myeloma.  Advised that Dr. Lorenso Courier believes it could be related to pt getting Dexamethasone 20 mg with his treatment. Brittney states he is afebrile and no s/s of an infection.  She will fax his labs to  this clinic.  She voiced understanding to the above.

## 2021-01-26 ENCOUNTER — Telehealth: Payer: Self-pay | Admitting: Hematology and Oncology

## 2021-01-26 NOTE — Telephone Encounter (Signed)
Per 7/1 los, pt aware

## 2021-02-10 ENCOUNTER — Other Ambulatory Visit: Payer: Medicare (Managed Care)

## 2021-02-10 ENCOUNTER — Ambulatory Visit: Payer: Medicare (Managed Care)

## 2021-02-10 ENCOUNTER — Ambulatory Visit: Payer: Medicare (Managed Care) | Admitting: Hematology and Oncology

## 2021-02-11 ENCOUNTER — Other Ambulatory Visit: Payer: Self-pay

## 2021-02-11 ENCOUNTER — Inpatient Hospital Stay (HOSPITAL_BASED_OUTPATIENT_CLINIC_OR_DEPARTMENT_OTHER): Payer: Medicare (Managed Care) | Admitting: Hematology and Oncology

## 2021-02-11 ENCOUNTER — Encounter: Payer: Self-pay | Admitting: Hematology and Oncology

## 2021-02-11 ENCOUNTER — Inpatient Hospital Stay: Payer: Medicare (Managed Care)

## 2021-02-11 ENCOUNTER — Inpatient Hospital Stay: Payer: Medicare (Managed Care) | Attending: Hematology and Oncology

## 2021-02-11 VITALS — BP 128/81 | HR 89 | Temp 97.4°F | Resp 20

## 2021-02-11 DIAGNOSIS — Z95828 Presence of other vascular implants and grafts: Secondary | ICD-10-CM

## 2021-02-11 DIAGNOSIS — Z5112 Encounter for antineoplastic immunotherapy: Secondary | ICD-10-CM | POA: Diagnosis present

## 2021-02-11 DIAGNOSIS — C9002 Multiple myeloma in relapse: Secondary | ICD-10-CM | POA: Diagnosis present

## 2021-02-11 DIAGNOSIS — Z79899 Other long term (current) drug therapy: Secondary | ICD-10-CM | POA: Insufficient documentation

## 2021-02-11 DIAGNOSIS — Z5111 Encounter for antineoplastic chemotherapy: Secondary | ICD-10-CM

## 2021-02-11 DIAGNOSIS — Z7189 Other specified counseling: Secondary | ICD-10-CM

## 2021-02-11 LAB — CBC WITH DIFFERENTIAL (CANCER CENTER ONLY)
Abs Immature Granulocytes: 0.02 10*3/uL (ref 0.00–0.07)
Basophils Absolute: 0 10*3/uL (ref 0.0–0.1)
Basophils Relative: 0 %
Eosinophils Absolute: 0.7 10*3/uL — ABNORMAL HIGH (ref 0.0–0.5)
Eosinophils Relative: 9 %
HCT: 36.3 % — ABNORMAL LOW (ref 39.0–52.0)
Hemoglobin: 12.8 g/dL — ABNORMAL LOW (ref 13.0–17.0)
Immature Granulocytes: 0 %
Lymphocytes Relative: 13 %
Lymphs Abs: 1 10*3/uL (ref 0.7–4.0)
MCH: 27.4 pg (ref 26.0–34.0)
MCHC: 35.3 g/dL (ref 30.0–36.0)
MCV: 77.7 fL — ABNORMAL LOW (ref 80.0–100.0)
Monocytes Absolute: 0.7 10*3/uL (ref 0.1–1.0)
Monocytes Relative: 9 %
Neutro Abs: 5.3 10*3/uL (ref 1.7–7.7)
Neutrophils Relative %: 69 %
Platelet Count: 331 10*3/uL (ref 150–400)
RBC: 4.67 MIL/uL (ref 4.22–5.81)
RDW: 15.9 % — ABNORMAL HIGH (ref 11.5–15.5)
WBC Count: 7.6 10*3/uL (ref 4.0–10.5)
nRBC: 0 % (ref 0.0–0.2)

## 2021-02-11 LAB — CMP (CANCER CENTER ONLY)
ALT: 21 U/L (ref 0–44)
AST: 19 U/L (ref 15–41)
Albumin: 3.8 g/dL (ref 3.5–5.0)
Alkaline Phosphatase: 88 U/L (ref 38–126)
Anion gap: 7 (ref 5–15)
BUN: 11 mg/dL (ref 8–23)
CO2: 27 mmol/L (ref 22–32)
Calcium: 9.1 mg/dL (ref 8.9–10.3)
Chloride: 108 mmol/L (ref 98–111)
Creatinine: 0.78 mg/dL (ref 0.61–1.24)
GFR, Estimated: >60 mL/min (ref >60)
Glucose, Bld: 135 mg/dL — ABNORMAL HIGH (ref 70–99)
Potassium: 3.6 mmol/L (ref 3.5–5.1)
Sodium: 142 mmol/L (ref 135–145)
Total Bilirubin: 0.6 mg/dL (ref 0.3–1.2)
Total Protein: 7.1 g/dL (ref 6.5–8.1)

## 2021-02-11 LAB — LACTATE DEHYDROGENASE: LDH: 91 U/L — ABNORMAL LOW (ref 98–192)

## 2021-02-11 MED ORDER — DIPHENHYDRAMINE HCL 25 MG PO CAPS
ORAL_CAPSULE | ORAL | Status: AC
  Filled 2021-02-11: qty 1

## 2021-02-11 MED ORDER — DEXAMETHASONE 4 MG PO TABS
ORAL_TABLET | ORAL | Status: AC
  Filled 2021-02-11: qty 5

## 2021-02-11 MED ORDER — ACETAMINOPHEN 325 MG PO TABS
650.0000 mg | ORAL_TABLET | Freq: Once | ORAL | Status: AC
  Administered 2021-02-11: 650 mg via ORAL

## 2021-02-11 MED ORDER — DEXAMETHASONE 4 MG PO TABS
20.0000 mg | ORAL_TABLET | Freq: Once | ORAL | Status: AC
  Administered 2021-02-11: 20 mg via ORAL

## 2021-02-11 MED ORDER — ACETAMINOPHEN 325 MG PO TABS
ORAL_TABLET | ORAL | Status: AC
  Filled 2021-02-11: qty 2

## 2021-02-11 MED ORDER — DARATUMUMAB-HYALURONIDASE-FIHJ 1800-30000 MG-UT/15ML ~~LOC~~ SOLN
1800.0000 mg | Freq: Once | SUBCUTANEOUS | Status: AC
  Administered 2021-02-11: 1800 mg via SUBCUTANEOUS
  Filled 2021-02-11: qty 15

## 2021-02-11 MED ORDER — DIPHENHYDRAMINE HCL 25 MG PO CAPS
25.0000 mg | ORAL_CAPSULE | Freq: Once | ORAL | Status: AC
  Administered 2021-02-11: 25 mg via ORAL

## 2021-02-11 NOTE — Patient Instructions (Signed)
[facility]  Discharge Instructions: Thank you for choosing [facility] to provide your oncology and hematology care.   If you have a lab appointment with the [facility], please go directly to the [facility] and check in at the registration area.   Wear comfortable clothing and clothing appropriate for easy access to any Portacath or PICC line.   We strive to give you quality time with your provider. You may need to reschedule your appointment if you arrive late (15 or more minutes).  Arriving late affects you and other patients whose appointments are after yours.  Also, if you miss three or more appointments without notifying the office, you may be dismissed from the clinic at the provider's discretion.      For prescription refill requests, have your pharmacy contact our office and allow 72 hours for refills to be completed.    Today you received the following chemotherapy and/or immunotherapy agents : Darzalex Faspro     To help prevent nausea and vomiting after your treatment, we encourage you to take your nausea medication as directed.  BELOW ARE SYMPTOMS THAT SHOULD BE REPORTED IMMEDIATELY: *FEVER GREATER THAN 100.4 F (38 C) OR HIGHER *CHILLS OR SWEATING *NAUSEA AND VOMITING THAT IS NOT CONTROLLED WITH YOUR NAUSEA MEDICATION *UNUSUAL SHORTNESS OF BREATH *UNUSUAL BRUISING OR BLEEDING *URINARY PROBLEMS (pain or burning when urinating, or frequent urination) *BOWEL PROBLEMS (unusual diarrhea, constipation, pain near the anus) TENDERNESS IN MOUTH AND THROAT WITH OR WITHOUT PRESENCE OF ULCERS (sore throat, sores in mouth, or a toothache) UNUSUAL RASH, SWELLING OR PAIN  UNUSUAL VAGINAL DISCHARGE OR ITCHING   Items with * indicate a potential emergency and should be followed up as soon as possible or go to the Emergency Department if any problems should occur.  Please show the CHEMOTHERAPY ALERT CARD or IMMUNOTHERAPY ALERT CARD at  check-in to the Emergency Department and triage nurse.  Should you have questions after your visit or need to cancel or reschedule your appointment, please contact [facility]  Dept: [phone]  and follow the prompts.  Office hours are 8:00 a.m. to 4:30 p.m. Monday - Friday. Please note that voicemails left after 4:00 p.m. may not be returned until the following business day.  We are closed weekends and major holidays. You have access to a nurse at all times for urgent questions. Please call the main number to the clinic Dept: [phone] and follow the prompts.   For any non-urgent questions, you may also contact your provider using MyChart. We now offer e-Visits for anyone 54 and older to request care online for non-urgent symptoms. For details visit [email or web address].   Also download the MyChart app! Go to the app store, search "MyChart", open the app, select , and log in with your MyChart username and password.  Due to Covid, a mask is required upon entering the hospital/clinic. If you do not have a mask, one will be given to you upon arrival. For doctor visits, patients may have 1 support person aged 72 or older with them. For treatment visits, patients cannot have anyone with them due to current Covid guidelines and our immunocompromised population.

## 2021-02-11 NOTE — Progress Notes (Signed)
[place] [phone]   [phone]  PROGRESS NOTE  Patient Care Team: [name], MD as PCP - General (Family Medicine) [name], MD as Consulting Physician (Physical Medicine and Rehabilitation) Donia Guiles Lavon Paganini, PA-C as Physician Assistant (Physical Medicine and Rehabilitation) [name], MD as Consulting Physician (Hematology and Oncology)  Hematological/Oncological History # IgG Lambda Multiple Myeloma, Relapsed (ISS Stage II) 1) 06/2010: initial diagnosis of Multiple Myeloma after T8 compression fracture. Treated with Velcade/Revlimid/Dexamethasone and achieved a complete remission 2) Velcade was discontinued in September 2012 and that Revlimid and Decadron were discontinued in March 2013. 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 4) Followed by Dr. Alvy Bimler, last clinic visit 10/09/2019. At that time there was concern for relapse of his multiple myeloma.  5) Patient requested transfer to different provider after misunderstanding regarding imaging studies 6) 12/17/2019: transfer care to Dr. Lorenso Courier  7) 01/09/2020: Cycle 1 Day 1 of Dara/Velcade/Dex 8) 01/21/2020: presented as urgent visit for diarrhea and dehydration. Holding chemotherapy scheduled for 01/23/2020. 9) 01/30/2020: Resume dara/velcade/dex after resolution of diarrhea.  10) 02/13/2020: restaging labs show M protein 0.8, Kappa 4.5, lamba 17.2, ratio 0.26, urine M protein 53 (7.1%). All MM labs indicate improvement.  11) 03/10/2020: Cycle 4 Day 1 of Dara/Velcade/Dex. Transition to q 3 week daratumumab.  12) 06/04/2020:  Cycle 8 Day 1 of Dara/Velcade/Dex 13) 06/25/2020:  Cycle 9 Day 1 of Dara/Velcade/Dex 14) 07/22/2020: Cycle 10 Day 1 of  Dara//Dex 15) 08/18/2020: Cycle 11 Day 1 of Dara//Dex 16) 09/23/2020: Cycle 12 Day 1 of Dara//Dex 17) 10/22/2020: Cycle 13 Day 1 of Dara/Dex  18)  11/19/2020: Cycle 14 Day 1 of Dara/Dex  19) 12/17/2020: Cycle 15 Day 1 of Dara/Dex  20) 01/17/2021: Cycle 16 Day 1 of Dara/Dex  21) 02/11/2021: Cycle 17 Day 1 of Dara/Dex   Interval History:  [name] 65 y.o. male with medical history significant for  IgG Lambda Multiple Myeloma who presents for a follow up visit. The patient's last visit was on 01/17/2021. In the interim he has had no changes in his health status.   On exam today [name] reports he has been well in the interim since her last visit.  He notes that he had a father's a night where he went to go see the [employer].  He notes that his energy is good and his appetite have been steady.  He notes that his ostomy output has been consistent without "too much output".  He notes also that his urine bag has not shown any abnormal colors or changes.  He denies any fevers, chills, sweats, shortness of breath, chest pain or cough. He has no other complaints. A full 10 point ROS is listed below.  MEDICAL HISTORY:  Past Medical History:  Diagnosis Date   Adrenal insufficiency ([place])    on chronic dexamethasone   Anemia    Cancer (HCC)    Coagulopathy (HCC)    on xeralto/ s/p DVT while on coumadin,  IVC in place   Diabetes mellitus without complication ([place])    type 2   Gross hematuria 7/14   post foley cath procedure   History of blood transfusion 7/14   Multiple myeloma    thoracic T8 with paraplegia s/p resection- on chemo at visit 10/13/10   Multiple myeloma    Multiple myeloma without mention of remission    Neurogenic bladder  Neurogenic bowel    Paraplegia (HCC)    Partial small bowel obstruction ([place]) during dec 2011 admission    SURGICAL HISTORY: Past Surgical History:  Procedure Laterality Date   COLONOSCOPY WITH PROPOFOL N/A 04/12/2017   Procedure: COLONOSCOPY WITH PROPOFOL;  Surgeon: Irene Shipper, MD;  Location: [facility];  Service: Endoscopy;  Laterality: N/A;   COLONOSCOPY WITH PROPOFOL N/A 04/19/2017    Procedure: COLONOSCOPY WITH PROPOFOL;  Surgeon: Yetta Flock, MD;  Location: [facility];  Service: Gastroenterology;  Laterality: N/A;   COLOSTOMY  07/20/2011   Procedure: COLOSTOMY;  Surgeon: Judieth Keens, DO;  Location: [place];  Service: General;;   COLOSTOMY REVISION  07/20/2011   Procedure: COLON RESECTION SIGMOID;  Surgeon: Judieth Keens, DO;  Location: [place];  Service: General;;   CYSTOSCOPY N/A 04/04/2013   Procedure: CYSTOSCOPY WITH LITHALOPAXY;  Surgeon: Alexis Frock, MD;  Location: [facility];  Service: Urology;  Laterality: N/A;   INSERTION OF SUPRAPUBIC CATHETER N/A 04/04/2013   Procedure: INSERTION OF SUPRAPUBIC CATHETER;  Surgeon: Alexis Frock, MD;  Location: [facility];  Service: Urology;  Laterality: N/A;   LAPAROTOMY  07/20/2011   Procedure: EXPLORATORY LAPAROTOMY;  Surgeon: Judieth Keens, DO;  Location: [place];  Service: General;  Laterality: N/A;   myeloma thoracic T8 with parpaplegia s/p thoracotomy and thoracic T7-9 cage placement on Dec 26th 2011  07/18/10    SOCIAL HISTORY: Social History   Socioeconomic History   Marital status: Married    Spouse name: Not on file   Number of children: Not on file   Years of education: Not on file   Highest education level: Not on file  Occupational History   Not on file  Tobacco Use   Smoking status: Never   Smokeless tobacco: Never  Vaping Use   Vaping Use: Never used  Substance and Sexual Activity   Alcohol use: No   Drug use: No   Sexual activity: Never  Other Topics Concern   Not on file  Social History Narrative   Not on file   Social Determinants of Health   Financial Resource Strain: Not on file  Food Insecurity: Not on file  Transportation Needs: Not on file  Physical Activity: Not on file  Stress: Not on file  Social Connections: Not on file  Intimate Partner Violence: Not on file    FAMILY HISTORY: Family History  Problem Relation Age of Onset   Ovarian cancer Mother     Diabetes Father     ALLERGIES:  is allergic to feraheme [ferumoxytol].  MEDICATIONS:  Current Outpatient Medications  Medication Sig Dispense Refill   acyclovir (ZOVIRAX) 400 MG tablet Take 1 tablet (400 mg total) by mouth 2 (two) times daily. 60 tablet 2   atorvastatin (LIPITOR) 10 MG tablet Take 10 mg by mouth daily.     baclofen (LIORESAL) 20 MG tablet Take 20 mg by mouth 2 (two) times daily.     diazepam (DIASTAT ACUDIAL) 10 MG GEL SMARTSIG:By Mouth     dorzolamide (TRUSOPT) 2 % ophthalmic solution 1 drop 2 (two) times daily.     iron polysaccharides (NU-IRON) 150 MG capsule Take 1 capsule (150 mg total) by mouth daily. 30 capsule 0   latanoprost (XALATAN) 0.005 % ophthalmic solution Place 1 drop into both eyes at bedtime.     ondansetron (ZOFRAN) 8 MG tablet Take 1 tablet (8 mg total) by mouth every 8 (eight) hours as needed for nausea or vomiting. 30 tablet 0  rivaroxaban (XARELTO) 10 MG TABS tablet Take 1 tablet (10 mg total) by mouth daily with supper. 30 tablet 9   Skin Protectants, Misc. (EUCERIN) cream Apply 1 application topically 2 (two) times daily as needed for dry skin.      zinc oxide (BALMEX) 11.3 % CREA cream Apply 1 application topically 2 (two) times daily.     No current facility-administered medications for this visit.   Facility-Administered Medications Ordered in Other Visits  Medication Dose Route Frequency Provider Last Rate Last Admin   sodium chloride flush (NS) 0.9 % injection 10 mL  10 mL Intravenous PRN Alvy Bimler, Ni, MD        REVIEW OF SYSTEMS:   Constitutional: ( - ) fevers, ( - )  chills , ( - ) night sweats Eyes: ( - ) blurriness of vision, ( - ) double vision, ( - ) watery eyes Ears, nose, mouth, throat, and face: ( - ) mucositis, ( - ) sore throat Respiratory: ( - ) cough, ( - ) dyspnea, ( - ) wheezes Cardiovascular: ( - ) palpitation, ( - ) chest discomfort, ( - ) lower extremity swelling Gastrointestinal:  ( - ) nausea, ( - ) heartburn, ( - )  change in bowel habits Skin: ( - ) abnormal skin rashes Lymphatics: ( - ) new lymphadenopathy, ( - ) easy bruising Neurological: ( - ) numbness, ( - ) tingling, ( - ) new weaknesses Behavioral/Psych: ( - ) mood change, ( - ) new changes  All other systems were reviewed with the patient and are negative.  PHYSICAL EXAMINATION: ECOG PERFORMANCE STATUS: paraplegic.   Vitals:   02/11/21 1115  BP: 128/81  Pulse: 89  Resp: 20  Temp: (!) 97.4 F (36.3 C)  SpO2: 100%     Filed Weights    GENERAL: well appearing middle aged [place] American male alert, no distress and comfortable SKIN: skin color, texture, turgor are normal, no rashes or significant lesions EYES: conjunctiva are pink and non-injected, sclera clear LUNGS: clear to auscultation and percussion with normal breathing effort HEART: regular rate & rhythm and no murmurs and no lower extremity edema Musculoskeletal: no cyanosis of digits and no clubbing  PSYCH: alert & oriented x 3, fluent speech NEURO: paraplegic, no use of LE bilaterally.   LABORATORY DATA:  I have reviewed the data as listed CBC Latest Ref Rng & Units 02/11/2021 01/17/2021 12/17/2020  WBC 4.0 - 10.5 K/uL 7.6 7.4 7.2  Hemoglobin 13.0 - 17.0 g/dL 12.8(L) 12.9(L) 12.7(L)  Hematocrit 39.0 - 52.0 % 36.3(L) 37.3(L) 36.3(L)  Platelets 150 - 400 K/uL 331 351 347    CMP Latest Ref Rng & Units 02/11/2021 01/17/2021 12/17/2020  Glucose 70 - 99 mg/dL 135(H) 98 145(H)  BUN 8 - 23 mg/dL 11 8 11   Creatinine 0.61 - 1.24 mg/dL 0.78 0.80 0.80  Sodium 135 - 145 mmol/L 142 144 146(H)  Potassium 3.5 - 5.1 mmol/L 3.6 3.9 3.6  Chloride 98 - 111 mmol/L 108 109 109  CO2 22 - 32 mmol/L 27 25 25   Calcium 8.9 - 10.3 mg/dL 9.1 9.4 9.5  Total Protein 6.5 - 8.1 g/dL 7.1 7.0 6.6  Total Bilirubin 0.3 - 1.2 mg/dL 0.6 0.5 0.5  Alkaline Phos 38 - 126 U/L 88 100 109  AST 15 - 41 U/L 19 17 23   ALT 0 - 44 U/L 21 21 38    Lab Results  Component Value Date   MPROTEIN 0.5 (H)  01/17/2021   MPROTEIN  0.4 (H) 12/17/2020   MPROTEIN 0.3 (H) 10/22/2020   Lab Results  Component Value Date   KPAFRELGTCHN 7.3 01/17/2021   KPAFRELGTCHN 7.1 12/17/2020   KPAFRELGTCHN 6.8 10/22/2020   LAMBDASER 10.5 01/17/2021   LAMBDASER 8.6 12/17/2020   LAMBDASER 7.9 10/22/2020   KAPLAMBRATIO 0.70 01/17/2021   KAPLAMBRATIO 0.83 12/17/2020   KAPLAMBRATIO 0.86 10/22/2020    RADIOGRAPHIC STUDIES: No results found.  ASSESSMENT & PLAN [name] 65 y.o. male with medical history significant for  IgG Lambda Multiple Myeloma who presents for a follow up visit.  After review of the labs, discussion with the patient, and reviewed the imaging his findings are most consistent with a relapsed multiple myeloma.The patient has had excellent success before with treatment of his myeloma with Velcade, Revlimid, and dexamethasone.  Treatment regimen consists of bortezomib 1.71m/m2 subq (Day 1,8, and 15), daratumumab 1800 mg subq (day 1,8 and 15), and dexamethasone 20 mg PO (day 1, 8 and 15). After 3 cycles, daratumumab to be administered q 3 weeks (from cycles 4-8). After cycle 8, continue daratumumab q 4 weeks alone (Alta CorningMed 2016; [id number]  On exam today, [name] stable without any significant changes to his health. Labs were reviewed today that show stable anemia with Hgb of 12.9. Patient will proceed with treatment today and return to the clinic prior to Cycle 18 Day 1.   # IgG Lambda Multiple Myeloma, Relapsed (ISS Stage II) --findings are most consistent with relapsed multiple myeloma. Patient previously successfully treated with Velcade/Rev/Dex --current plan is Daratumumab/Velcade/Dex  (started 01/09/2020). On 07/02/2020 he transitioned to monthly daratumumab alone. Continue q 4 week dara. --recollect restaging MM labs monthly to include MM panel, SFLC. Additionally will collect LDH and beta 2 microglobulin.  --return for start of Cycle 18 on 03/11/2021.    #History of  DVT --He had placement of IVC filter, remains on Xarelto. --Due to poor mobility, and lack of bleeding complications, I recommend he remain on Xarelto indefinitely. --caution if Plt count were to drop <50  # Supportive Care -- provided patient with an albuterol inhaler (for use with daratumumab) --acyclovir 4035mBID for VZV prophylaxis --zofran 5m7m8H PRN for nausea/vomiting  --Zometa can be considered after dental clearance, though with his prior episode of osteonecrosis the risk may outweigh the benefits. --patient recieved pretreatment RBC phenotype  No orders of the defined types were placed in this encounter.  All questions were answered. The patient knows to call the clinic with any problems, questions or concerns.  A total of more than 30 minutes were spent on this encounter and over half of that time was spent on counseling and coordination of care as outlined above.   JohLedell PeoplesD Department of Hematology/Oncology [facility] Hospitalone: [phone]: [phone]: [email or web address]  02/11/2021 5:17 PM   Literature Support:  PalArchie Endoeisel K, Raliegh Ipooka AK, Masszi T, BekNorth El MontepiDarmstadt Hun[place] Munder M, MatBrainerdark TM, Qi M, Schecter J, AmiBrunsvillein X, Deraedt W, Ahmadi T, Spencer A, Sonneveld P; [employer] Investig[employer]. Daratumumab, Bortezomib, and Dexamethasone for Multiple Myeloma. N EAlta Corningd. 2016 Aug 25;375(8):754-66  --among patients with relapsed or relapsed and refractory multiple myeloma, daratumumab in combination with bortezomib and dexamethasone resulted in significantly longer progression-free survival than bortezomib and dexamethasone alone and was associated with infusion-related reactions and higher rates of thrombocytopenia and neutropenia than bortezomib and dexamethasone alone.

## 2021-02-14 LAB — KAPPA/LAMBDA LIGHT CHAINS
Kappa free light chain: 7.6 mg/L (ref 3.3–19.4)
Kappa, lambda light chain ratio: 0.76 (ref 0.26–1.65)
Lambda free light chains: 10 mg/L (ref 5.7–26.3)

## 2021-02-16 LAB — MULTIPLE MYELOMA PANEL, SERUM
Albumin SerPl Elph-Mcnc: 3.5 g/dL (ref 2.9–4.4)
Albumin/Glob SerPl: 1.3 (ref 0.7–1.7)
Alpha 1: 0.3 g/dL (ref 0.0–0.4)
Alpha2 Glob SerPl Elph-Mcnc: 1 g/dL (ref 0.4–1.0)
B-Globulin SerPl Elph-Mcnc: 0.9 g/dL (ref 0.7–1.3)
Gamma Glob SerPl Elph-Mcnc: 0.7 g/dL (ref 0.4–1.8)
Globulin, Total: 2.8 g/dL (ref 2.2–3.9)
IgA: 43 mg/dL — ABNORMAL LOW (ref 61–437)
IgG (Immunoglobin G), Serum: 854 mg/dL (ref 603–1613)
IgM (Immunoglobulin M), Srm: 18 mg/dL — ABNORMAL LOW (ref 20–172)
M Protein SerPl Elph-Mcnc: 0.4 g/dL — ABNORMAL HIGH
Total Protein ELP: 6.3 g/dL (ref 6.0–8.5)

## 2021-02-21 ENCOUNTER — Telehealth: Payer: Self-pay | Admitting: Hematology and Oncology

## 2021-02-21 NOTE — Telephone Encounter (Signed)
R/s 8/19 appt due to provider pal. Called and left msg for [facility]ACE.

## 2021-03-06 ENCOUNTER — Other Ambulatory Visit: Payer: Self-pay | Admitting: Hematology and Oncology

## 2021-03-10 ENCOUNTER — Other Ambulatory Visit: Payer: Self-pay

## 2021-03-10 ENCOUNTER — Inpatient Hospital Stay: Payer: Medicare (Managed Care) | Attending: Hematology and Oncology

## 2021-03-10 ENCOUNTER — Other Ambulatory Visit: Payer: Self-pay | Admitting: *Deleted

## 2021-03-10 ENCOUNTER — Inpatient Hospital Stay: Payer: Medicare (Managed Care)

## 2021-03-10 ENCOUNTER — Inpatient Hospital Stay (HOSPITAL_BASED_OUTPATIENT_CLINIC_OR_DEPARTMENT_OTHER): Payer: Medicare (Managed Care) | Admitting: Physician Assistant

## 2021-03-10 VITALS — BP 108/74 | HR 87 | Temp 97.9°F | Resp 18 | Ht 68.0 in | Wt 250.3 lb

## 2021-03-10 DIAGNOSIS — Z7901 Long term (current) use of anticoagulants: Secondary | ICD-10-CM | POA: Insufficient documentation

## 2021-03-10 DIAGNOSIS — C9002 Multiple myeloma in relapse: Secondary | ICD-10-CM | POA: Insufficient documentation

## 2021-03-10 DIAGNOSIS — D696 Thrombocytopenia, unspecified: Secondary | ICD-10-CM | POA: Insufficient documentation

## 2021-03-10 DIAGNOSIS — Z5112 Encounter for antineoplastic immunotherapy: Secondary | ICD-10-CM | POA: Insufficient documentation

## 2021-03-10 DIAGNOSIS — Z7952 Long term (current) use of systemic steroids: Secondary | ICD-10-CM | POA: Diagnosis not present

## 2021-03-10 DIAGNOSIS — Z79899 Other long term (current) drug therapy: Secondary | ICD-10-CM | POA: Insufficient documentation

## 2021-03-10 DIAGNOSIS — E119 Type 2 diabetes mellitus without complications: Secondary | ICD-10-CM | POA: Diagnosis not present

## 2021-03-10 DIAGNOSIS — Z86718 Personal history of other venous thrombosis and embolism: Secondary | ICD-10-CM | POA: Insufficient documentation

## 2021-03-10 DIAGNOSIS — Z7189 Other specified counseling: Secondary | ICD-10-CM

## 2021-03-10 LAB — CMP (CANCER CENTER ONLY)
ALT: 19 U/L (ref 0–44)
AST: 18 U/L (ref 15–41)
Albumin: 3.5 g/dL (ref 3.5–5.0)
Alkaline Phosphatase: 105 U/L (ref 38–126)
Anion gap: 8 (ref 5–15)
BUN: 14 mg/dL (ref 8–23)
CO2: 27 mmol/L (ref 22–32)
Calcium: 9.4 mg/dL (ref 8.9–10.3)
Chloride: 107 mmol/L (ref 98–111)
Creatinine: 0.9 mg/dL (ref 0.61–1.24)
GFR, Estimated: >60 mL/min (ref >60)
Glucose, Bld: 100 mg/dL — ABNORMAL HIGH (ref 70–99)
Potassium: 3.7 mmol/L (ref 3.5–5.1)
Sodium: 142 mmol/L (ref 135–145)
Total Bilirubin: 0.4 mg/dL (ref 0.3–1.2)
Total Protein: 7 g/dL (ref 6.5–8.1)

## 2021-03-10 LAB — CBC WITH DIFFERENTIAL (CANCER CENTER ONLY)
Abs Immature Granulocytes: 0.01 10*3/uL (ref 0.00–0.07)
Basophils Absolute: 0.1 10*3/uL (ref 0.0–0.1)
Basophils Relative: 1 %
Eosinophils Absolute: 0.5 10*3/uL (ref 0.0–0.5)
Eosinophils Relative: 5 %
HCT: 37.5 % — ABNORMAL LOW (ref 39.0–52.0)
Hemoglobin: 13.3 g/dL (ref 13.0–17.0)
Immature Granulocytes: 0 %
Lymphocytes Relative: 15 %
Lymphs Abs: 1.3 10*3/uL (ref 0.7–4.0)
MCH: 27.3 pg (ref 26.0–34.0)
MCHC: 35.5 g/dL (ref 30.0–36.0)
MCV: 76.8 fL — ABNORMAL LOW (ref 80.0–100.0)
Monocytes Absolute: 0.8 10*3/uL (ref 0.1–1.0)
Monocytes Relative: 10 %
Neutro Abs: 5.7 10*3/uL (ref 1.7–7.7)
Neutrophils Relative %: 69 %
Platelet Count: 353 10*3/uL (ref 150–400)
RBC: 4.88 MIL/uL (ref 4.22–5.81)
RDW: 16.1 % — ABNORMAL HIGH (ref 11.5–15.5)
WBC Count: 8.3 10*3/uL (ref 4.0–10.5)
nRBC: 0 % (ref 0.0–0.2)

## 2021-03-10 MED ORDER — DEXAMETHASONE 4 MG PO TABS
20.0000 mg | ORAL_TABLET | Freq: Once | ORAL | Status: AC
  Administered 2021-03-10: 20 mg via ORAL
  Filled 2021-03-10: qty 5

## 2021-03-10 MED ORDER — DARATUMUMAB-HYALURONIDASE-FIHJ 1800-30000 MG-UT/15ML ~~LOC~~ SOLN
1800.0000 mg | Freq: Once | SUBCUTANEOUS | Status: AC
  Administered 2021-03-10: 1800 mg via SUBCUTANEOUS
  Filled 2021-03-10: qty 15

## 2021-03-10 MED ORDER — DIPHENHYDRAMINE HCL 25 MG PO CAPS
25.0000 mg | ORAL_CAPSULE | Freq: Once | ORAL | Status: AC
  Administered 2021-03-10: 25 mg via ORAL
  Filled 2021-03-10: qty 1

## 2021-03-10 MED ORDER — ACETAMINOPHEN 325 MG PO TABS
650.0000 mg | ORAL_TABLET | Freq: Once | ORAL | Status: AC
  Administered 2021-03-10: 650 mg via ORAL
  Filled 2021-03-10: qty 2

## 2021-03-10 NOTE — Progress Notes (Signed)
[facility] [phone]   [phone]  PROGRESS NOTE  Patient Care Team: [name], MD as PCP - General (Family Medicine) [name], MD as Consulting Physician (Physical Medicine and Rehabilitation) Donia Guiles Lavon Paganini, PA-C as Physician Assistant (Physical Medicine and Rehabilitation) [name], MD as Consulting Physician (Hematology and Oncology)  Hematological/Oncological History # IgG Lambda Multiple Myeloma, Relapsed (ISS Stage II) 1) 06/2010: initial diagnosis of Multiple Myeloma after T8 compression fracture. Treated with Velcade/Revlimid/Dexamethasone and achieved a complete remission 2) Velcade was discontinued in September 2012 and that Revlimid and Decadron were discontinued in March 2013. 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 4) Followed by Dr. Alvy Bimler, last clinic visit 10/09/2019. At that time there was concern for relapse of his multiple myeloma.  5) Patient requested transfer to different provider after misunderstanding regarding imaging studies 6) 12/17/2019: transfer care to Dr. Lorenso Courier  7) 01/09/2020: Cycle 1 Day 1 of Dara/Velcade/Dex 8) 01/21/2020: presented as urgent visit for diarrhea and dehydration. Holding chemotherapy scheduled for 01/23/2020. 9) 01/30/2020: Resume dara/velcade/dex after resolution of diarrhea.  10) 02/13/2020: restaging labs show M protein 0.8, Kappa 4.5, lamba 17.2, ratio 0.26, urine M protein 53 (7.1%). All MM labs indicate improvement.  11) 03/10/2020: Cycle 4 Day 1 of Dara/Velcade/Dex. Transition to q 3 week daratumumab.  12) 06/04/2020:  Cycle 8 Day 1 of Dara/Velcade/Dex 13) 06/25/2020:  Cycle 9 Day 1 of Dara/Velcade/Dex 14) 07/22/2020: Cycle 10 Day 1 of  Dara//Dex 15) 08/18/2020: Cycle 11 Day 1 of Dara//Dex 16) 09/23/2020: Cycle 12 Day 1 of Dara//Dex 17) 10/22/2020: Cycle 13 Day 1 of Dara/Dex  18)  11/19/2020: Cycle 14 Day 1 of Dara/Dex  19) 12/17/2020: Cycle 15 Day 1 of Dara/Dex  20) 01/17/2021: Cycle 16 Day 1 of Dara/Dex  21) 02/11/2021: Cycle 17 Day 1 of Dara/Dex 22) 03/10/2021: Cycle 18 Day 1 of Dara/Dex   Interval History:  [name] 65 y.o. male with medical history significant for  IgG Lambda Multiple Myeloma who presents for a follow up visit. The patient's last visit was on 02/11/2021.   On exam today [name] reports he has been well in the interim since her last visit. He notes that his energy is good and his appetite have been steady.  He admits to eating a little bit unhealthy which is contributing to his weight gain. He denies any nausea, vomiting or abdominal pain. Patient's colostomy is working well without any issues. The stool consistency is based on what he eats. Over the past several weeks, patient has noticed increased rectal secretions that is clear to brown in color. Additionally, he has noticed increased urgency. He denies stool or blood. He denies any fevers, chills, night sweats, shortness of breath, chest pain or cough. He has no other complaints.A full 10 point ROS is listed below.  MEDICAL HISTORY:  Past Medical History:  Diagnosis Date   Adrenal insufficiency ([place])    on chronic dexamethasone   Anemia    Cancer (HCC)    Coagulopathy (HCC)    on xeralto/ s/p DVT while on coumadin,  IVC in place   Diabetes mellitus without complication ([place])    type 2   Gross hematuria 7/14   post foley cath procedure   History of blood transfusion 7/14   Multiple myeloma    thoracic T8 with paraplegia s/p resection- on chemo at visit 10/13/10   Multiple myeloma  Multiple myeloma without mention of remission    Neurogenic bladder    Neurogenic bowel    Paraplegia (HCC)    Partial small bowel obstruction ([place]) during dec 2011 admission    SURGICAL HISTORY: Past Surgical History:  Procedure Laterality Date   COLONOSCOPY WITH PROPOFOL N/A 04/12/2017   Procedure:  COLONOSCOPY WITH PROPOFOL;  Surgeon: Azaylia Fong Shipper, MD;  Location: [facility];  Service: Endoscopy;  Laterality: N/A;   COLONOSCOPY WITH PROPOFOL N/A 04/19/2017   Procedure: COLONOSCOPY WITH PROPOFOL;  Surgeon: Yetta Flock, MD;  Location: [facility];  Service: Gastroenterology;  Laterality: N/A;   COLOSTOMY  07/20/2011   Procedure: COLOSTOMY;  Surgeon: Judieth Keens, DO;  Location: [facility];  Service: General;;   COLOSTOMY REVISION  07/20/2011   Procedure: COLON RESECTION SIGMOID;  Surgeon: Judieth Keens, DO;  Location: [facility];  Service: General;;   CYSTOSCOPY N/A 04/04/2013   Procedure: CYSTOSCOPY WITH LITHALOPAXY;  Surgeon: Alexis Frock, MD;  Location: [facility];  Service: Urology;  Laterality: N/A;   INSERTION OF SUPRAPUBIC CATHETER N/A 04/04/2013   Procedure: INSERTION OF SUPRAPUBIC CATHETER;  Surgeon: Alexis Frock, MD;  Location: [facility];  Service: Urology;  Laterality: N/A;   LAPAROTOMY  07/20/2011   Procedure: EXPLORATORY LAPAROTOMY;  Surgeon: Judieth Keens, DO;  Location: [facility];  Service: General;  Laterality: N/A;   myeloma thoracic T8 with parpaplegia s/p thoracotomy and thoracic T7-9 cage placement on Dec 26th 2011  07/18/10    SOCIAL HISTORY: Social History   Socioeconomic History   Marital status: Married    Spouse name: Not on file   Number of children: Not on file   Years of education: Not on file   Highest education level: Not on file  Occupational History   Not on file  Tobacco Use   Smoking status: Never   Smokeless tobacco: Never  Vaping Use   Vaping Use: Never used  Substance and Sexual Activity   Alcohol use: No   Drug use: No   Sexual activity: Never  Other Topics Concern   Not on file  Social History Narrative   Not on file   Social Determinants of Health   Financial Resource Strain: Not on file  Food Insecurity: Not on file  Transportation Needs: Not on file  Physical Activity: Not on file  Stress: Not on file  Social  Connections: Not on file  Intimate Partner Violence: Not on file    FAMILY HISTORY: Family History  Problem Relation Age of Onset   Ovarian cancer Mother    Diabetes Father     ALLERGIES:  is allergic to feraheme [ferumoxytol].  MEDICATIONS:  Current Outpatient Medications  Medication Sig Dispense Refill   acyclovir (ZOVIRAX) 400 MG tablet Take 1 tablet (400 mg total) by mouth 2 (two) times daily. 60 tablet 2   atorvastatin (LIPITOR) 10 MG tablet Take 10 mg by mouth daily.     baclofen (LIORESAL) 20 MG tablet Take 20 mg by mouth 2 (two) times daily.     diazepam (DIASTAT ACUDIAL) 10 MG GEL SMARTSIG:By Mouth     dorzolamide (TRUSOPT) 2 % ophthalmic solution 1 drop 2 (two) times daily.     iron polysaccharides (NU-IRON) 150 MG capsule Take 1 capsule (150 mg total) by mouth daily. 30 capsule 0   latanoprost (XALATAN) 0.005 % ophthalmic solution Place 1 drop into both eyes at bedtime.     ondansetron (ZOFRAN) 8 MG tablet Take 1 tablet (8 mg total) by  mouth every 8 (eight) hours as needed for nausea or vomiting. (Patient not taking: Reported on 03/10/2021) 30 tablet 0   rivaroxaban (XARELTO) 10 MG TABS tablet Take 1 tablet (10 mg total) by mouth daily with supper. 30 tablet 9   Skin Protectants, Misc. (EUCERIN) cream Apply 1 application topically 2 (two) times daily as needed for dry skin.      zinc oxide (BALMEX) 11.3 % CREA cream Apply 1 application topically 2 (two) times daily.     No current facility-administered medications for this visit.   Facility-Administered Medications Ordered in Other Visits  Medication Dose Route Frequency Provider Last Rate Last Admin   acetaminophen (TYLENOL) tablet 650 mg  650 mg Oral Once Orson Slick, MD       daratumumab-hyaluronidase-fihj [facility] FASPRO) 1800-30000 MG-UT/15ML chemo SQ injection 1,800 mg  1,800 mg Subcutaneous Once Orson Slick, MD       dexamethasone (DECADRON) tablet 20 mg  20 mg Oral Once Orson Slick, MD        diphenhydrAMINE (BENADRYL) capsule 25 mg  25 mg Oral Once Ledell Peoples IV, MD       sodium chloride flush (NS) 0.9 % injection 10 mL  10 mL Intravenous PRN Alvy Bimler, Ni, MD        REVIEW OF SYSTEMS:   Constitutional: ( - ) fevers, ( - )  chills , ( - ) night sweats Eyes: ( - ) blurriness of vision, ( - ) double vision, ( - ) watery eyes Ears, nose, mouth, throat, and face: ( - ) mucositis, ( - ) sore throat Respiratory: ( - ) cough, ( - ) dyspnea, ( - ) wheezes Cardiovascular: ( - ) palpitation, ( - ) chest discomfort, ( - ) lower extremity swelling Gastrointestinal:  ( - ) nausea, ( - ) heartburn, ( - ) change in bowel habits Skin: ( - ) abnormal skin rashes Lymphatics: ( - ) new lymphadenopathy, ( - ) easy bruising Neurological: ( - ) numbness, ( - ) tingling, ( - ) new weaknesses Behavioral/Psych: ( - ) mood change, ( - ) new changes  All other systems were reviewed with the patient and are negative.  PHYSICAL EXAMINATION: ECOG PERFORMANCE STATUS: paraplegic.   Vitals:   03/10/21 1338  BP: 108/74  Pulse: 87  Resp: 18  Temp: 97.9 F (36.6 C)     Filed Weights   03/10/21 1338  Weight: 250 lb 4.8 oz (113.5 kg)    GENERAL: well appearing middle aged [place] American male alert, no distress and comfortable SKIN: skin color, texture, turgor are normal, no rashes or significant lesions EYES: conjunctiva are pink and non-injected, sclera clear LUNGS: clear to auscultation and percussion with normal breathing effort HEART: regular rate & rhythm and no murmurs and no lower extremity edema Musculoskeletal: no cyanosis of digits and no clubbing  PSYCH: alert & oriented x 3, fluent speech NEURO: paraplegic, no use of LE bilaterally.   LABORATORY DATA:  I have reviewed the data as listed CBC Latest Ref Rng & Units 03/10/2021 02/11/2021 01/17/2021  WBC 4.0 - 10.5 K/uL 8.3 7.6 7.4  Hemoglobin 13.0 - 17.0 g/dL 13.3 12.8(L) 12.9(L)  Hematocrit 39.0 - 52.0 % 37.5(L) 36.3(L) 37.3(L)   Platelets 150 - 400 K/uL 353 331 351    CMP Latest Ref Rng & Units 03/10/2021 02/11/2021 01/17/2021  Glucose 70 - 99 mg/dL 100(H) 135(H) 98  BUN 8 - 23 mg/dL 14 11 8  Creatinine 0.61 - 1.24 mg/dL 0.90 0.78 0.80  Sodium 135 - 145 mmol/L 142 142 144  Potassium 3.5 - 5.1 mmol/L 3.7 3.6 3.9  Chloride 98 - 111 mmol/L 107 108 109  CO2 22 - 32 mmol/L 27 27 25   Calcium 8.9 - 10.3 mg/dL 9.4 9.1 9.4  Total Protein 6.5 - 8.1 g/dL 7.0 7.1 7.0  Total Bilirubin 0.3 - 1.2 mg/dL 0.4 0.6 0.5  Alkaline Phos 38 - 126 U/L 105 88 100  AST 15 - 41 U/L 18 19 17   ALT 0 - 44 U/L 19 21 21     Lab Results  Component Value Date   MPROTEIN 0.4 (H) 02/11/2021   MPROTEIN 0.5 (H) 01/17/2021   MPROTEIN 0.4 (H) 12/17/2020   Lab Results  Component Value Date   KPAFRELGTCHN 7.6 02/11/2021   KPAFRELGTCHN 7.3 01/17/2021   KPAFRELGTCHN 7.1 12/17/2020   LAMBDASER 10.0 02/11/2021   LAMBDASER 10.5 01/17/2021   LAMBDASER 8.6 12/17/2020   KAPLAMBRATIO 0.76 02/11/2021   KAPLAMBRATIO 0.70 01/17/2021   KAPLAMBRATIO 0.83 12/17/2020    RADIOGRAPHIC STUDIES: No results found.  ASSESSMENT & PLAN SCHNEUR CROWSON 65 y.o. male with medical history significant for  IgG Lambda Multiple Myeloma who presents for a follow up visit.  After review of the labs, discussion with the patient, and reviewed the imaging his findings are most consistent with a relapsed multiple myeloma.The patient has had excellent success before with treatment of his myeloma with Velcade, Revlimid, and dexamethasone.  Treatment regimen consists of bortezomib 1.21m/m2 subq (Day 1,8, and 15), daratumumab 1800 mg subq (day 1,8 and 15), and dexamethasone 20 mg PO (day 1, 8 and 15). After 3 cycles, daratumumab to be administered q 3 weeks (from cycles 4-8). After cycle 8, continue daratumumab q 4 weeks alone (Alta CorningMed 2016; 3322:025-427  On exam today, [name] stable without any significant changes to his health. Labs were reviewed today that show WBC,  Hgb and Plt all within normal limits. CMP was unremarkable.  MM panel is pending. Patient will proceed with treatment today and return to the clinic prior to Cycle 19 Day 1.   # IgG Lambda Multiple Myeloma, Relapsed (ISS Stage II) --findings are most consistent with relapsed multiple myeloma. Patient previously successfully treated with Velcade/Rev/Dex --current plan is Daratumumab/Velcade/Dex  (started 01/09/2020). On 07/02/2020 he transitioned to monthly daratumumab alone. Continue q 4 week dara. --recollect restaging MM labs monthly to include MM panel, SFLC. Additionally will collect LDH and beta 2 microglobulin.  --return for start of Cycle 19 on 04/08/2021  #History of DVT --He had placement of IVC filter, remains on Xarelto. --Due to poor mobility, and lack of bleeding complications, I recommend he remain on Xarelto indefinitely. --caution if Plt count were to drop <50  # Supportive Care -- provided patient with an albuterol inhaler (for use with daratumumab) --acyclovir 4069mBID for VZV prophylaxis --zofran 9m45m8H PRN for nausea/vomiting  --Zometa can be considered after dental clearance, though with his prior episode of osteonecrosis the risk may outweigh the benefits. --patient recieved pretreatment RBC phenotype  #Rectal secretions with increased urgency: -Will request a follow up with Dr. PerHenrene Pastorastroenterologist, to further evaluate.   No orders of the defined types were placed in this encounter.  All questions were answered. The patient knows to call the clinic with any problems, questions or concerns.  I have spent a total of 30 minutes minutes of face-to-face and non-face-to-face time, preparing to see the patient,  performing a medically appropriate examination, counseling and educating the patient, communicating with other health care professionals, documenting clinical information in the electronic health record,  and care coordination.    Gary Brigham  PA-C Department of Hematology/Oncology [place] at [facility] Phone: [phone]   03/10/2021 2:57 PM   Literature Support:  Laurene Footman, [place], Masszi T, [place], [place], Grant V, Munder M, [place], [place], Qi M, Schecter J, [place], Qin X, Deraedt W, Ahmadi T, Spencer A, Sonneveld P; [employer] Investigators. Daratumumab, Bortezomib, and Dexamethasone for Multiple Myeloma. [employer] Cor[employer] Med. 2016 Aug 25;375(8):754-66  --among patients with relapsed or relapsed and refractory multiple myeloma, daratumumab in combination with bortezomib and dexamethasone resulted in significantly longer progression-free survival than bortezomib and dexamethasone alone and was associated with infusion-related reactions and higher rates of thrombocytopenia and neutropenia than bortezomib and dexamethasone alone.

## 2021-03-10 NOTE — Patient Instructions (Signed)
[facility]  Discharge Instructions: Thank you for choosing [facility] to provide your oncology and hematology care.   If you have a lab appointment with the [facility], please go directly to the [facility] and check in at the registration area.   Wear comfortable clothing and clothing appropriate for easy access to any Portacath or PICC line.   We strive to give you quality time with your provider. You may need to reschedule your appointment if you arrive late (15 or more minutes).  Arriving late affects you and other patients whose appointments are after yours.  Also, if you miss three or more appointments without notifying the office, you may be dismissed from the clinic at the provider's discretion.      For prescription refill requests, have your pharmacy contact our office and allow 72 hours for refills to be completed.    Today you received the following chemotherapy and/or immunotherapy agents: Darzalex Faspro   To help prevent nausea and vomiting after your treatment, we encourage you to take your nausea medication as directed.  BELOW ARE SYMPTOMS THAT SHOULD BE REPORTED IMMEDIATELY: *FEVER GREATER THAN 100.4 F (38 C) OR HIGHER *CHILLS OR SWEATING *NAUSEA AND VOMITING THAT IS NOT CONTROLLED WITH YOUR NAUSEA MEDICATION *UNUSUAL SHORTNESS OF BREATH *UNUSUAL BRUISING OR BLEEDING *URINARY PROBLEMS (pain or burning when urinating, or frequent urination) *BOWEL PROBLEMS (unusual diarrhea, constipation, pain near the anus) TENDERNESS IN MOUTH AND THROAT WITH OR WITHOUT PRESENCE OF ULCERS (sore throat, sores in mouth, or a toothache) UNUSUAL RASH, SWELLING OR PAIN  UNUSUAL VAGINAL DISCHARGE OR ITCHING   Items with * indicate a potential emergency and should be followed up as soon as possible or go to the Emergency Department if any problems should occur.  Please show the CHEMOTHERAPY ALERT CARD or IMMUNOTHERAPY ALERT CARD at check-in  to the Emergency Department and triage nurse.  Should you have questions after your visit or need to cancel or reschedule your appointment, please contact [facility]  Dept: [phone]  and follow the prompts.  Office hours are 8:00 a.m. to 4:30 p.m. Monday - Friday. Please note that voicemails left after 4:00 p.m. may not be returned until the following business day.  We are closed weekends and major holidays. You have access to a nurse at all times for urgent questions. Please call the main number to the clinic Dept: [phone] and follow the prompts.   For any non-urgent questions, you may also contact your provider using MyChart. We now offer e-Visits for anyone 34 and older to request care online for non-urgent symptoms. For details visit [email or web address].   Also download the MyChart app! Go to the app store, search "MyChart", open the app, select [facility], and log in with your MyChart username and password.  Due to Covid, a mask is required upon entering the hospital/clinic. If you do not have a mask, one will be given to you upon arrival. For doctor visits, patients may have 1 support person aged 94 or older with them. For treatment visits, patients cannot have anyone with them due to current Covid guidelines and our immunocompromised population.

## 2021-03-11 ENCOUNTER — Other Ambulatory Visit: Payer: Medicare (Managed Care)

## 2021-03-11 ENCOUNTER — Ambulatory Visit: Payer: Medicare (Managed Care)

## 2021-03-11 ENCOUNTER — Telehealth: Payer: Self-pay | Admitting: *Deleted

## 2021-03-11 ENCOUNTER — Ambulatory Visit: Payer: Medicare (Managed Care) | Admitting: Hematology and Oncology

## 2021-03-11 LAB — KAPPA/LAMBDA LIGHT CHAINS
Kappa free light chain: 7.2 mg/L (ref 3.3–19.4)
Kappa, lambda light chain ratio: 0.39 (ref 0.26–1.65)
Lambda free light chains: 18.4 mg/L (ref 5.7–26.3)

## 2021-03-11 NOTE — Telephone Encounter (Signed)
[name] regarding the increased mucous from his rectum. Spoke with him and advised that Dr. Henrene Pastor was notified of the situation. Dr. Henrene Pastor advised that this is a normal variation,  and that it is a benign condition.  [name] understood this but it does cause him issues at night as he is paraplegic. Advised that he could call Dr. Blanch Media office if he needed to. Pt voiced understanding

## 2021-03-11 NOTE — Telephone Encounter (Signed)
-----  Message from Lincoln Brigham, PA-C sent at 03/11/2021  3:18 PM EDT ----- Regarding: FW: F/U request [name],  Can you let the patient know?   Thanks, Murray Hodgkins ----- Message ----- From: Irene Shipper, MD Sent: 03/11/2021   2:50 PM EDT To: Lincoln Brigham, PA-C Subject: RE: F/U request                                Hello Murray Hodgkins, Thanks for reaching out. In patients with rectal stump, mucous per rectum is normal. It can vary overtime. It's benign. He had similar complaints, along with bleeding, in 2018 with unremarkable rectal stump on endoscopic examination. Nothing to do about it short of colostomy reversal by surgery. I think that we can spare him the trouble of an office visit. Please provide reassurance. Thanks, Dr. Henrene Pastor   ----- Message ----- From: Lincoln Brigham, [place] Sent: 03/10/2021   3:01 PM EDT To: Irene Shipper, MD Subject: F/U request                                    Hey Dr. Henrene Pastor,   I saw [name] today for follow up for multiple myeloma. He mentioned to me that he has noticed increased volume of rectal secretions with urgency. He denies noticing any stool or blood. His colostomy is functioning well without any issues. Would it be possible to follow up with the patient to further discuss his symptoms?  I appreciate your help!  -Murray Hodgkins

## 2021-03-15 LAB — MULTIPLE MYELOMA PANEL, SERUM
Albumin SerPl Elph-Mcnc: 3.3 g/dL (ref 2.9–4.4)
Albumin/Glob SerPl: 1.2 (ref 0.7–1.7)
Alpha 1: 0.3 g/dL (ref 0.0–0.4)
Alpha2 Glob SerPl Elph-Mcnc: 0.9 g/dL (ref 0.4–1.0)
B-Globulin SerPl Elph-Mcnc: 0.9 g/dL (ref 0.7–1.3)
Gamma Glob SerPl Elph-Mcnc: 0.8 g/dL (ref 0.4–1.8)
Globulin, Total: 2.9 g/dL (ref 2.2–3.9)
IgA: 35 mg/dL — ABNORMAL LOW (ref 61–437)
IgG (Immunoglobin G), Serum: 916 mg/dL (ref 603–1613)
IgM (Immunoglobulin M), Srm: 16 mg/dL — ABNORMAL LOW (ref 20–172)
M Protein SerPl Elph-Mcnc: 0.5 g/dL — ABNORMAL HIGH
Total Protein ELP: 6.2 g/dL (ref 6.0–8.5)

## 2021-04-08 ENCOUNTER — Other Ambulatory Visit: Payer: Self-pay | Admitting: Hematology and Oncology

## 2021-04-08 ENCOUNTER — Encounter: Payer: Self-pay | Admitting: Hematology and Oncology

## 2021-04-08 ENCOUNTER — Inpatient Hospital Stay: Payer: Medicare (Managed Care) | Attending: Hematology and Oncology

## 2021-04-08 ENCOUNTER — Inpatient Hospital Stay (HOSPITAL_BASED_OUTPATIENT_CLINIC_OR_DEPARTMENT_OTHER): Payer: Medicare (Managed Care) | Admitting: Hematology and Oncology

## 2021-04-08 ENCOUNTER — Other Ambulatory Visit: Payer: Self-pay

## 2021-04-08 ENCOUNTER — Inpatient Hospital Stay: Payer: Medicare (Managed Care)

## 2021-04-08 VITALS — BP 124/82 | HR 85 | Temp 97.9°F | Resp 17 | Ht 68.0 in | Wt 242.9 lb

## 2021-04-08 DIAGNOSIS — Z86718 Personal history of other venous thrombosis and embolism: Secondary | ICD-10-CM | POA: Diagnosis not present

## 2021-04-08 DIAGNOSIS — Z8041 Family history of malignant neoplasm of ovary: Secondary | ICD-10-CM | POA: Insufficient documentation

## 2021-04-08 DIAGNOSIS — Z7901 Long term (current) use of anticoagulants: Secondary | ICD-10-CM | POA: Diagnosis not present

## 2021-04-08 DIAGNOSIS — C9002 Multiple myeloma in relapse: Secondary | ICD-10-CM

## 2021-04-08 DIAGNOSIS — Z5112 Encounter for antineoplastic immunotherapy: Secondary | ICD-10-CM | POA: Insufficient documentation

## 2021-04-08 DIAGNOSIS — Z7189 Other specified counseling: Secondary | ICD-10-CM | POA: Diagnosis not present

## 2021-04-08 DIAGNOSIS — E119 Type 2 diabetes mellitus without complications: Secondary | ICD-10-CM | POA: Insufficient documentation

## 2021-04-08 LAB — CBC WITH DIFFERENTIAL (CANCER CENTER ONLY)
Abs Immature Granulocytes: 0.03 10*3/uL (ref 0.00–0.07)
Basophils Absolute: 0 10*3/uL (ref 0.0–0.1)
Basophils Relative: 1 %
Eosinophils Absolute: 0.4 10*3/uL (ref 0.0–0.5)
Eosinophils Relative: 4 %
HCT: 40.5 % (ref 39.0–52.0)
Hemoglobin: 13.9 g/dL (ref 13.0–17.0)
Immature Granulocytes: 0 %
Lymphocytes Relative: 14 %
Lymphs Abs: 1.1 10*3/uL (ref 0.7–4.0)
MCH: 27.3 pg (ref 26.0–34.0)
MCHC: 34.3 g/dL (ref 30.0–36.0)
MCV: 79.4 fL — ABNORMAL LOW (ref 80.0–100.0)
Monocytes Absolute: 0.6 10*3/uL (ref 0.1–1.0)
Monocytes Relative: 7 %
Neutro Abs: 6.1 10*3/uL (ref 1.7–7.7)
Neutrophils Relative %: 74 %
Platelet Count: 357 10*3/uL (ref 150–400)
RBC: 5.1 MIL/uL (ref 4.22–5.81)
RDW: 16.6 % — ABNORMAL HIGH (ref 11.5–15.5)
WBC Count: 8.3 10*3/uL (ref 4.0–10.5)
nRBC: 0 % (ref 0.0–0.2)

## 2021-04-08 LAB — CMP (CANCER CENTER ONLY)
ALT: 25 U/L (ref 0–44)
AST: 20 U/L (ref 15–41)
Albumin: 3.8 g/dL (ref 3.5–5.0)
Alkaline Phosphatase: 117 U/L (ref 38–126)
Anion gap: 10 (ref 5–15)
BUN: 7 mg/dL — ABNORMAL LOW (ref 8–23)
CO2: 27 mmol/L (ref 22–32)
Calcium: 9.9 mg/dL (ref 8.9–10.3)
Chloride: 106 mmol/L (ref 98–111)
Creatinine: 0.86 mg/dL (ref 0.61–1.24)
GFR, Estimated: >60 mL/min (ref >60)
Glucose, Bld: 125 mg/dL — ABNORMAL HIGH (ref 70–99)
Potassium: 4 mmol/L (ref 3.5–5.1)
Sodium: 143 mmol/L (ref 135–145)
Total Bilirubin: 0.5 mg/dL (ref 0.3–1.2)
Total Protein: 7.6 g/dL (ref 6.5–8.1)

## 2021-04-08 LAB — LACTATE DEHYDROGENASE: LDH: 120 U/L (ref 98–192)

## 2021-04-08 MED ORDER — DEXAMETHASONE 4 MG PO TABS
20.0000 mg | ORAL_TABLET | Freq: Once | ORAL | Status: AC
  Administered 2021-04-08: 20 mg via ORAL

## 2021-04-08 MED ORDER — ACETAMINOPHEN 325 MG PO TABS
650.0000 mg | ORAL_TABLET | Freq: Once | ORAL | Status: AC
  Administered 2021-04-08: 650 mg via ORAL

## 2021-04-08 MED ORDER — DIPHENHYDRAMINE HCL 25 MG PO CAPS
25.0000 mg | ORAL_CAPSULE | Freq: Once | ORAL | Status: AC
  Administered 2021-04-08: 25 mg via ORAL

## 2021-04-08 MED ORDER — DIPHENHYDRAMINE HCL 25 MG PO CAPS
ORAL_CAPSULE | ORAL | Status: AC
  Filled 2021-04-08: qty 1

## 2021-04-08 MED ORDER — ACETAMINOPHEN 325 MG PO TABS
ORAL_TABLET | ORAL | Status: AC
  Filled 2021-04-08: qty 2

## 2021-04-08 MED ORDER — DEXAMETHASONE 4 MG PO TABS
ORAL_TABLET | ORAL | Status: AC
  Filled 2021-04-08: qty 5

## 2021-04-08 MED ORDER — DARATUMUMAB-HYALURONIDASE-FIHJ 1800-30000 MG-UT/15ML ~~LOC~~ SOLN
1800.0000 mg | Freq: Once | SUBCUTANEOUS | Status: AC
  Administered 2021-04-08: 1800 mg via SUBCUTANEOUS
  Filled 2021-04-08: qty 15

## 2021-04-08 NOTE — Patient Instructions (Signed)
[facility]  Discharge Instructions: Thank you for choosing [facility] to provide your oncology and hematology care.   If you have a lab appointment with the [facility], please go directly to the [facility] and check in at the registration area.   Wear comfortable clothing and clothing appropriate for easy access to any Portacath or PICC line.   We strive to give you quality time with your provider. You may need to reschedule your appointment if you arrive late (15 or more minutes).  Arriving late affects you and other patients whose appointments are after yours.  Also, if you miss three or more appointments without notifying the office, you may be dismissed from the clinic at the provider's discretion.      For prescription refill requests, have your pharmacy contact our office and allow 72 hours for refills to be completed.    Today you received the following chemotherapy and/or immunotherapy agents darzalex faspro      To help prevent nausea and vomiting after your treatment, we encourage you to take your nausea medication as directed.  BELOW ARE SYMPTOMS THAT SHOULD BE REPORTED IMMEDIATELY: *FEVER GREATER THAN 100.4 F (38 C) OR HIGHER *CHILLS OR SWEATING *NAUSEA AND VOMITING THAT IS NOT CONTROLLED WITH YOUR NAUSEA MEDICATION *UNUSUAL SHORTNESS OF BREATH *UNUSUAL BRUISING OR BLEEDING *URINARY PROBLEMS (pain or burning when urinating, or frequent urination) *BOWEL PROBLEMS (unusual diarrhea, constipation, pain near the anus) TENDERNESS IN MOUTH AND THROAT WITH OR WITHOUT PRESENCE OF ULCERS (sore throat, sores in mouth, or a toothache) UNUSUAL RASH, SWELLING OR PAIN  UNUSUAL VAGINAL DISCHARGE OR ITCHING   Items with * indicate a potential emergency and should be followed up as soon as possible or go to the Emergency Department if any problems should occur.  Please show the CHEMOTHERAPY ALERT CARD or IMMUNOTHERAPY ALERT CARD at  check-in to the Emergency Department and triage nurse.  Should you have questions after your visit or need to cancel or reschedule your appointment, please contact [facility]  Dept: [phone]  and follow the prompts.  Office hours are 8:00 a.m. to 4:30 p.m. Monday - Friday. Please note that voicemails left after 4:00 p.m. may not be returned until the following business day.  We are closed weekends and major holidays. You have access to a nurse at all times for urgent questions. Please call the main number to the clinic Dept: [phone] and follow the prompts.   For any non-urgent questions, you may also contact your provider using MyChart. We now offer e-Visits for anyone 27 and older to request care online for non-urgent symptoms. For details visit [email or web address].   Also download the MyChart app! Go to the app store, search "MyChart", open the app, select [facility], and log in with your MyChart username and password.  Due to Covid, a mask is required upon entering the hospital/clinic. If you do not have a mask, one will be given to you upon arrival. For doctor visits, patients may have 1 support person aged 38 or older with them. For treatment visits, patients cannot have anyone with them due to current Covid guidelines and our immunocompromised population.

## 2021-04-08 NOTE — Progress Notes (Signed)
[place] [phone]   Fax:([phone]  PROGRESS NOTE  Patient Care Team: [name], MD as PCP - General (Family Medicine) [name], MD as Consulting Physician (Physical Medicine and Rehabilitation) Donia Guiles Lavon Paganini, PA-C as Physician Assistant (Physical Medicine and Rehabilitation) [name], MD as Consulting Physician (Hematology and Oncology)  Hematological/Oncological History # IgG Lambda Multiple Myeloma, Relapsed (ISS Stage II) 1) 06/2010: initial diagnosis of Multiple Myeloma after T8 compression fracture. Treated with Velcade/Revlimid/Dexamethasone and achieved a complete remission 2) Velcade was discontinued in September 2012 and that Revlimid and Decadron were discontinued in March 2013. 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 4) Followed by Dr. Alvy Bimler, last clinic visit 10/09/2019. At that time there was concern for relapse of his multiple myeloma.  5) Patient requested transfer to different provider after misunderstanding regarding imaging studies 6) 12/17/2019: transfer care to Dr. Lorenso Courier  7) 01/09/2020: Cycle 1 Day 1 of Dara/Velcade/Dex 8) 01/21/2020: presented as urgent visit for diarrhea and dehydration. Holding chemotherapy scheduled for 01/23/2020. 9) 01/30/2020: Resume dara/velcade/dex after resolution of diarrhea.  10) 02/13/2020: restaging labs show M protein 0.8, Kappa 4.5, lamba 17.2, ratio 0.26, urine M protein 53 (7.1%). All MM labs indicate improvement.  11) 03/10/2020: Cycle 4 Day 1 of Dara/Velcade/Dex. Transition to q 3 week daratumumab.  12) 06/04/2020:  Cycle 8 Day 1 of Dara/Velcade/Dex 13) 06/25/2020:  Cycle 9 Day 1 of Dara/Velcade/Dex 14) 07/22/2020: Cycle 10 Day 1 of  Dara//Dex 15) 08/18/2020: Cycle 11 Day 1 of Dara//Dex 16) 09/23/2020: Cycle 12 Day 1 of Dara//Dex 17) 10/22/2020: Cycle 13 Day 1 of Dara/Dex  18)  11/19/2020: Cycle 14 Day 1 of Dara/Dex  19) 12/17/2020: Cycle 15 Day 1 of Dara/Dex  20) 01/17/2021: Cycle 16 Day 1 of Dara/Dex  21) 02/11/2021: Cycle 17 Day 1 of Dara/Dex 22) 03/10/2021: Cycle 18 Day 1 of Dara/Dex  23) 04/08/2021: Cycle 19 Day 1 of Dara/Dex   Interval History:  [name] 65 y.o. male with medical history significant for  IgG Lambda Multiple Myeloma who presents for a follow up visit. The patient's last visit was on 03/10/2021.   On exam today [name] reports he has been well in the interim since our last visit.  He notes his energy levels are good and is not having any trouble with shortness of breath.  He notes that he has not had any issues with infections or required antibiotics.  He has not contracted the COVID virus and doing his best to stay protected from that.  He has not noticed any side effects as result of his daratumumab shots.  His ostomy output has been normal and he has noticed no urinary symptoms.  He denies any fevers, chills, night sweats, shortness of breath, chest pain or cough. He has no other complaints.A full 10 point ROS is listed below.  MEDICAL HISTORY:  Past Medical History:  Diagnosis Date   Adrenal insufficiency ([place])    on chronic dexamethasone   Anemia    Cancer (HCC)    Coagulopathy (HCC)    on xeralto/ s/p DVT while on coumadin,  IVC in place   Diabetes mellitus without complication ([place])    type 2   Gross hematuria 7/14   post foley cath procedure   History of blood transfusion 7/14   Multiple myeloma    thoracic T8 with paraplegia s/p resection- on chemo at visit  10/13/10   Multiple myeloma    Multiple myeloma without mention of remission    Neurogenic bladder    Neurogenic bowel    Paraplegia (HCC)    Partial small bowel obstruction ([place]) during dec 2011 admission    SURGICAL HISTORY: Past Surgical History:  Procedure Laterality Date   COLONOSCOPY WITH PROPOFOL N/A 04/12/2017   Procedure: COLONOSCOPY WITH PROPOFOL;  Surgeon:  Irene Shipper, MD;  [facility];  Service: Endoscopy;  Laterality: N/A;   COLONOSCOPY WITH PROPOFOL N/A 04/19/2017   Procedure: COLONOSCOPY WITH PROPOFOL;  Surgeon: Yetta Flock, MD;  [facility];  Service: Gastroenterology;  Laterality: N/A;   COLOSTOMY  07/20/2011   Procedure: COLOSTOMY;  Surgeon: Judieth Keens, DO;  Location: [facility];  Service: General;;   COLOSTOMY REVISION  07/20/2011   Procedure: COLON RESECTION SIGMOID;  Surgeon: Judieth Keens, DO;  Location: [facility];  Service: General;;   CYSTOSCOPY N/A 04/04/2013   Procedure: CYSTOSCOPY WITH LITHALOPAXY;  Surgeon: Alexis Frock, MD;  [facility];  Service: Urology;  Laterality: N/A;   INSERTION OF SUPRAPUBIC CATHETER N/A 04/04/2013   Procedure: INSERTION OF SUPRAPUBIC CATHETER;  Surgeon: Alexis Frock, MD;  [facility];  Service: Urology;  Laterality: N/A;   LAPAROTOMY  07/20/2011   Procedure: EXPLORATORY LAPAROTOMY;  Surgeon: Judieth Keens, DO;  Location: [facility];  Service: General;  Laterality: N/A;   myeloma thoracic T8 with parpaplegia s/p thoracotomy and thoracic T7-9 cage placement on Dec 26th 2011  07/18/10    SOCIAL HISTORY: Social History   Socioeconomic History   Marital status: Married    Spouse name: Not on file   Number of children: Not on file   Years of education: Not on file   Highest education level: Not on file  Occupational History   Not on file  Tobacco Use   Smoking status: Never   Smokeless tobacco: Never  Vaping Use   Vaping Use: Never used  Substance and Sexual Activity   Alcohol use: No   Drug use: No   Sexual activity: Never  Other Topics Concern   Not on file  Social History Narrative   Not on file   Social Determinants of Health   Financial Resource Strain: Not on file  Food Insecurity: Not on file  Transportation Needs: Not on file  Physical Activity: Not on file  Stress: Not on file  Social Connections: Not on file  Intimate Partner  Violence: Not on file    FAMILY HISTORY: Family History  Problem Relation Age of Onset   Ovarian cancer Mother    Diabetes Father     ALLERGIES:  is allergic to feraheme [ferumoxytol].  MEDICATIONS:  Current Outpatient Medications  Medication Sig Dispense Refill   acyclovir (ZOVIRAX) 400 MG tablet Take 1 tablet (400 mg total) by mouth 2 (two) times daily. 60 tablet 2   atorvastatin (LIPITOR) 10 MG tablet Take 10 mg by mouth daily.     baclofen (LIORESAL) 20 MG tablet Take 20 mg by mouth 2 (two) times daily.     diazepam (DIASTAT ACUDIAL) 10 MG GEL SMARTSIG:By Mouth     dorzolamide (TRUSOPT) 2 % ophthalmic solution 1 drop 2 (two) times daily.     iron polysaccharides (NU-IRON) 150 MG capsule Take 1 capsule (150 mg total) by mouth daily. 30 capsule 0   latanoprost (XALATAN) 0.005 % ophthalmic solution Place 1 drop into both eyes at bedtime.     ondansetron (ZOFRAN) 8 MG  tablet Take 1 tablet (8 mg total) by mouth every 8 (eight) hours as needed for nausea or vomiting. (Patient not taking: Reported on 03/10/2021) 30 tablet 0   rivaroxaban (XARELTO) 10 MG TABS tablet Take 1 tablet (10 mg total) by mouth daily with supper. 30 tablet 9   Skin Protectants, Misc. (EUCERIN) cream Apply 1 application topically 2 (two) times daily as needed for dry skin.      zinc oxide (BALMEX) 11.3 % CREA cream Apply 1 application topically 2 (two) times daily.     No current facility-administered medications for this visit.   Facility-Administered Medications Ordered in Other Visits  Medication Dose Route Frequency Provider Last Rate Last Admin   sodium chloride flush (NS) 0.9 % injection 10 mL  10 mL Intravenous PRN Alvy Bimler, Ni, MD        REVIEW OF SYSTEMS:   Constitutional: ( - ) fevers, ( - )  chills , ( - ) night sweats Eyes: ( - ) blurriness of vision, ( - ) double vision, ( - ) watery eyes Ears, nose, mouth, throat, and face: ( - ) mucositis, ( - ) sore throat Respiratory: ( - ) cough, ( - )  dyspnea, ( - ) wheezes Cardiovascular: ( - ) palpitation, ( - ) chest discomfort, ( - ) lower extremity swelling Gastrointestinal:  ( - ) nausea, ( - ) heartburn, ( - ) change in bowel habits Skin: ( - ) abnormal skin rashes Lymphatics: ( - ) new lymphadenopathy, ( - ) easy bruising Neurological: ( - ) numbness, ( - ) tingling, ( - ) new weaknesses Behavioral/Psych: ( - ) mood change, ( - ) new changes  All other systems were reviewed with the patient and are negative.  PHYSICAL EXAMINATION: ECOG PERFORMANCE STATUS: paraplegic.   Vitals:   04/08/21 1051  BP: 124/82  Pulse: 85  Resp: 17  Temp: 97.9 F (36.6 C)  SpO2: 99%     Filed Weights   04/08/21 1051  Weight: 242 lb 14.4 oz (110.2 kg)    GENERAL: well appearing middle aged [place] American male alert, no distress and comfortable SKIN: skin color, texture, turgor are normal, no rashes or significant lesions EYES: conjunctiva are pink and non-injected, sclera clear LUNGS: clear to auscultation and percussion with normal breathing effort HEART: regular rate & rhythm and no murmurs and no lower extremity edema Musculoskeletal: no cyanosis of digits and no clubbing  PSYCH: alert & oriented x 3, fluent speech NEURO: paraplegic, no use of LE bilaterally.   LABORATORY DATA:  I have reviewed the data as listed CBC Latest Ref Rng & Units 04/08/2021 03/10/2021 02/11/2021  WBC 4.0 - 10.5 K/uL 8.3 8.3 7.6  Hemoglobin 13.0 - 17.0 g/dL 13.9 13.3 12.8(L)  Hematocrit 39.0 - 52.0 % 40.5 37.5(L) 36.3(L)  Platelets 150 - 400 K/uL 357 353 331    CMP Latest Ref Rng & Units 04/08/2021 03/10/2021 02/11/2021  Glucose 70 - 99 mg/dL 125(H) 100(H) 135(H)  BUN 8 - 23 mg/dL 7(L) 14 11  Creatinine 0.61 - 1.24 mg/dL 0.86 0.90 0.78  Sodium 135 - 145 mmol/L 143 142 142  Potassium 3.5 - 5.1 mmol/L 4.0 3.7 3.6  Chloride 98 - 111 mmol/L 106 107 108  CO2 22 - 32 mmol/L 27 27 27   Calcium 8.9 - 10.3 mg/dL 9.9 9.4 9.1  Total Protein 6.5 - 8.1 g/dL 7.6 7.0  7.1  Total Bilirubin 0.3 - 1.2 mg/dL 0.5 0.4 0.6  Alkaline Phos 38 -  126 U/L 117 105 88  AST 15 - 41 U/L 20 18 19   ALT 0 - 44 U/L 25 19 21     Lab Results  Component Value Date   MPROTEIN 0.5 (H) 03/10/2021   MPROTEIN 0.4 (H) 02/11/2021   MPROTEIN 0.5 (H) 01/17/2021   Lab Results  Component Value Date   KPAFRELGTCHN 7.2 03/10/2021   KPAFRELGTCHN 7.6 02/11/2021   KPAFRELGTCHN 7.3 01/17/2021   LAMBDASER 18.4 03/10/2021   LAMBDASER 10.0 02/11/2021   LAMBDASER 10.5 01/17/2021   KAPLAMBRATIO 0.39 03/10/2021   KAPLAMBRATIO 0.76 02/11/2021   KAPLAMBRATIO 0.70 01/17/2021    RADIOGRAPHIC STUDIES: No results found.  ASSESSMENT & PLAN [name] 65 y.o. male with medical history significant for  IgG Lambda Multiple Myeloma who presents for a follow up visit.  After review of the labs, discussion with the patient, and reviewed the imaging his findings are most consistent with a relapsed multiple myeloma.The patient has had excellent success before with treatment of his myeloma with Velcade, Revlimid, and dexamethasone.  Treatment regimen consists of bortezomib 1.72m/m2 subq (Day 1,8, and 15), daratumumab 1800 mg subq (day 1,8 and 15), and dexamethasone 20 mg PO (day 1, 8 and 15). After 3 cycles, daratumumab to be administered q 3 weeks (from cycles 4-8). After cycle 8, continue daratumumab q 4 weeks alone ([employer] [employer]ningMed 2016; [id number]  On exam today, Mr. MSamekis stable without any significant changes to his health. Labs were reviewed today that show WBC, Hgb and Plt all within normal limits. CMP was unremarkable.  MM panel is pending. Patient will proceed with treatment today and return to the clinic prior to Cycle 20 Day 1.   # IgG Lambda Multiple Myeloma, Relapsed (ISS Stage II) --findings are most consistent with relapsed multiple myeloma. Patient previously successfully treated with Velcade/Rev/Dex --current plan is Daratumumab/Velcade/Dex  (started 01/09/2020). On 07/02/2020 he  transitioned to monthly daratumumab alone. Continue q 4 week dara. --recollect restaging MM labs monthly to include MM panel, SFLC. Additionally will collect LDH and beta 2 microglobulin.  --return for start of Cycle 20 on 05/06/2021  #History of DVT --He had placement of IVC filter, remains on Xarelto. --Due to poor mobility, and lack of bleeding complications, I recommend he remain on Xarelto indefinitely. --caution if Plt count were to drop <50  # Supportive Care -- provided patient with an albuterol inhaler (for use with daratumumab) --acyclovir 4053mBID for VZV prophylaxis --zofran 27m63m8H PRN for nausea/vomiting  --Zometa can be considered after dental clearance, though with his prior episode of osteonecrosis the risk may outweigh the benefits. --patient recieved pretreatment RBC phenotype  #Rectal secretions with increased urgency: - follow up with Dr. PerHenrene Pastorastroenterologist  No orders of the defined types were placed in this encounter.  All questions were answered. The patient knows to call the clinic with any problems, questions or concerns.  I have spent a total of 30 minutes minutes of face-to-face and non-face-to-face time, preparing to see the patient,  performing a medically appropriate examination, counseling and educating the patient, communicating with other health care professionals, documenting clinical information in the electronic health record,  and care coordination.   JohLedell PeoplesD Department of Hematology/Oncology [place] [facility] LLCone: [phone]: [phone]: [email or web address]   04/08/2021 11:31 AM   Literature Support:  PalArchie Endoeisel K, Raliegh Ipooka AK, Masszi T, BekColorado SpringspiHamler HunCentral Valley Munder M, MatDorneyvillear[place]  Qi M, Schecter J, Amin H, Qin X, Deraedt W, Ahmadi T, Spencer A, Sonneveld P; [employer] Investigators. Daratumumab, Bortezomib, and Dexamethasone for Multiple  Myeloma. [employer]. 2016 Aug 25;375(8):754-66  --among patients with relapsed or relapsed and refractory multiple myeloma, daratumumab in combination with bortezomib and dexamethasone resulted in significantly longer progression-free survival than bortezomib and dexamethasone alone and was associated with infusion-related reactions and higher rates of thrombocytopenia and neutropenia than bortezomib and dexamethasone alone.

## 2021-04-11 ENCOUNTER — Other Ambulatory Visit: Payer: Self-pay | Admitting: Hematology and Oncology

## 2021-04-11 LAB — KAPPA/LAMBDA LIGHT CHAINS
Kappa free light chain: 7.9 mg/L (ref 3.3–19.4)
Kappa, lambda light chain ratio: 0.42 (ref 0.26–1.65)
Lambda free light chains: 18.8 mg/L (ref 5.7–26.3)

## 2021-04-13 LAB — MULTIPLE MYELOMA PANEL, SERUM
Albumin SerPl Elph-Mcnc: 3.4 g/dL (ref 2.9–4.4)
Albumin/Glob SerPl: 1.1 (ref 0.7–1.7)
Alpha 1: 0.3 g/dL (ref 0.0–0.4)
Alpha2 Glob SerPl Elph-Mcnc: 1 g/dL (ref 0.4–1.0)
B-Globulin SerPl Elph-Mcnc: 0.9 g/dL (ref 0.7–1.3)
Gamma Glob SerPl Elph-Mcnc: 1 g/dL (ref 0.4–1.8)
Globulin, Total: 3.1 g/dL (ref 2.2–3.9)
IgA: 40 mg/dL — ABNORMAL LOW (ref 61–437)
IgG (Immunoglobin G), Serum: 1046 mg/dL (ref 603–1613)
IgM (Immunoglobulin M), Srm: 18 mg/dL — ABNORMAL LOW (ref 20–172)
M Protein SerPl Elph-Mcnc: 0.6 g/dL — ABNORMAL HIGH
Total Protein ELP: 6.5 g/dL (ref 6.0–8.5)

## 2021-05-05 ENCOUNTER — Ambulatory Visit: Payer: Self-pay

## 2021-05-05 ENCOUNTER — Ambulatory Visit: Payer: Medicare (Managed Care) | Admitting: Orthopedic Surgery

## 2021-05-05 ENCOUNTER — Ambulatory Visit (INDEPENDENT_AMBULATORY_CARE_PROVIDER_SITE_OTHER): Payer: Medicare (Managed Care) | Admitting: Orthopedic Surgery

## 2021-05-05 ENCOUNTER — Encounter: Payer: Self-pay | Admitting: Orthopedic Surgery

## 2021-05-05 DIAGNOSIS — M899 Disorder of bone, unspecified: Secondary | ICD-10-CM | POA: Diagnosis not present

## 2021-05-05 DIAGNOSIS — M898X5 Other specified disorders of bone, thigh: Secondary | ICD-10-CM

## 2021-05-05 DIAGNOSIS — M79604 Pain in right leg: Secondary | ICD-10-CM | POA: Diagnosis not present

## 2021-05-05 DIAGNOSIS — M79605 Pain in left leg: Secondary | ICD-10-CM | POA: Diagnosis not present

## 2021-05-05 NOTE — Progress Notes (Signed)
Office Visit Note   Patient: [name]           Date of Birth: [birth year]           MRN: [id number] Visit Date: 05/05/2021              Requested by: [name], MD [address] [place],  [place] [address] PCP: [name], MD  Chief Complaint  Patient presents with   Left Leg - Follow-up   Right Leg - Follow-up      HPI: Patient is a 65 year old gentleman ambulates in a motorized wheelchair who presents in follow-up for lytic lesions bilateral femurs secondary to multiple myeloma.  Patient is still on injections for improving bone density.  Assessment & Plan: Visit Diagnoses:  1. Lytic bone lesion of right femur   2. Lytic bone lesion of left femur     Plan: Continue with current care follow-up in 3 months with repeat 2 view radiographs of both femurs.  Follow-Up Instructions: Return in about 3 months (around 08/05/2021).   Ortho Exam  Patient is alert, oriented, no adenopathy, well-dressed, normal affect, normal respiratory effort. Examination patient is asymptomatic radiographs show essentially unchanged lytic lesions in both femurs.  Patient is currently on rivaroxaban therapy.  Imaging: XR FEMUR MIN 2 VIEWS LEFT  Result Date: 05/05/2021 2 view radiographs of the left femur shows no change in the lytic lesion and involves approximately 25% of the femoral cortex.  XR FEMUR, MIN 2 VIEWS RIGHT  Result Date: 05/05/2021 2 view radiographs of the right femur shows a persistent lytic lesion previously this was 26 mm in diameter currently 27 mm in diameter involving approximately 50% of the cortex.  Essentially unchanged.  No images are attached to the encounter.  Labs: Lab Results  Component Value Date   HGBA1C 6.0 (H) 07/22/2014   ESRSEDRATE 73 (H) 10/21/2013   ESRSEDRATE 55 (H) 07/23/2013   ESRSEDRATE 95 (H) 07/13/2010   CRP 1.0 (H) 07/14/2010   LABURIC 8.2 (H) 07/21/2014   LABURIC 5.1 09/02/2010   REPTSTATUS 01/25/2020 FINAL 01/21/2020    CULT (A) 01/21/2020    >=100,000 COLONIES/mL PROTEUS MIRABILIS 20,000 COLONIES/mL STAPHYLOCOCCUS AUREUS    LABORGA PROTEUS MIRABILIS (A) 01/21/2020   LABORGA STAPHYLOCOCCUS AUREUS (A) 01/21/2020     Lab Results  Component Value Date   ALBUMIN 3.8 04/08/2021   ALBUMIN 3.5 03/10/2021   ALBUMIN 3.8 02/11/2021    Lab Results  Component Value Date   MG 1.8 01/21/2020   MG 1.5 07/21/2014   MG 1.3 (L) 02/13/2013   Lab Results  Component Value Date   VD25OH 59 04/08/2013   VD25OH 58 01/16/2013   VD25OH 63 11/15/2012    No results found for: PREALBUMIN CBC EXTENDED Latest Ref Rng & Units 04/08/2021 03/10/2021 02/11/2021  WBC 4.0 - 10.5 K/uL 8.3 8.3 7.6  RBC 4.22 - 5.81 MIL/uL 5.10 4.88 4.67  HGB 13.0 - 17.0 g/dL 13.9 13.3 12.8(L)  HCT 39.0 - 52.0 % 40.5 37.5(L) 36.3(L)  PLT 150 - 400 K/uL 357 353 331  NEUTROABS 1.7 - 7.7 K/uL 6.1 5.7 5.3  LYMPHSABS 0.7 - 4.0 K/uL 1.1 1.3 1.0     There is no height or weight on file to calculate BMI.  Orders:  Orders Placed This Encounter  Procedures   XR FEMUR MIN 2 VIEWS LEFT   XR FEMUR, MIN 2 VIEWS RIGHT   No orders of the defined types were placed in this encounter.  Procedures: No procedures performed  Clinical Data: No additional findings.  ROS:  All other systems negative, except as noted in the HPI. Review of Systems  Objective: Vital Signs: There were no vitals taken for this visit.  Specialty Comments:  No specialty comments available.  PMFS History: Patient Active Problem List   Diagnosis Date Noted   Goals of care, counseling/discussion 10/09/2019   On rivaroxaban therapy    Status post colonoscopy with polypectomy    Lower GI bleeding 04/18/2017   Acute blood loss anemia 04/18/2017   Diabetes mellitus type 2 in obese ([place]) 04/18/2017   Colon cancer screening    Benign neoplasm of ascending colon    History of DVT (deep vein thrombosis) 06/10/2015   Obstructed suprapubic catheter ([place]) 07/21/2014    Bilateral hydronephrosis 07/21/2014   Acute kidney failure ([place]) 07/20/2014   Abdominal pain 07/20/2014   AKI (acute kidney injury) ([place]) 07/20/2014   Hematuria 02/12/2013   Shock ([place]) 02/12/2013   Adrenal insufficiency ([place]) 02/12/2013   Hypoalbuminemia 11/15/2012   Anemia, unspecified 09/18/2012   Unspecified vitamin D deficiency 09/18/2012   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   Iron deficiency anemia 09/18/2012   Diverticulitis of large intestine with perforation 07/26/2011   Multiple myeloma in relapse ([place]) 10/14/2010   Neurogenic bowel 10/14/2010   Paraplegia ([place]) 07/13/2010   Neurogenic bladder 07/11/2010   BACK PAIN, LUMBAR, WITH RADICULOPATHY 07/07/2010   DISEQUILIBRIUM 07/07/2010   Abdominal pain, generalized 07/07/2010   OBESITY, NOS 09/20/2006   Past Medical History:  Diagnosis Date   Adrenal insufficiency (HCC)    on chronic dexamethasone   Anemia    Cancer (HCC)    Coagulopathy (HCC)    on xeralto/ s/p DVT while on coumadin,  IVC in place   Diabetes mellitus without complication ([place])    type 2   Gross hematuria 7/14   post foley cath procedure   History of blood transfusion 7/14   Multiple myeloma    thoracic T8 with paraplegia s/p resection- on chemo at visit 10/13/10   Multiple myeloma    Multiple myeloma without mention of remission    Neurogenic bladder    Neurogenic bowel    Paraplegia (HCC)    Partial small bowel obstruction ([place]) during dec 2011 admission    Family History  Problem Relation Age of Onset   Ovarian cancer Mother    Diabetes Father     Past Surgical History:  Procedure Laterality Date   COLONOSCOPY WITH PROPOFOL N/A 04/12/2017   Procedure: COLONOSCOPY WITH PROPOFOL;  Surgeon: Irene Shipper, MD;  [facility];  Service: Endoscopy;  Laterality: N/A;   COLONOSCOPY WITH PROPOFOL N/A 04/19/2017   Procedure: COLONOSCOPY WITH PROPOFOL;  Surgeon: Yetta Flock, MD;  [facility];  Service: Gastroenterology;  Laterality: N/A;   COLOSTOMY  07/20/2011   Procedure: COLOSTOMY;  Surgeon: Judieth Keens, DO;  Location: [facility];  Service: General;;   COLOSTOMY REVISION  07/20/2011   Procedure: COLON RESECTION SIGMOID;  Surgeon: Judieth Keens, DO;  Location: [facility];  Service: General;;   CYSTOSCOPY N/A 04/04/2013   Procedure: CYSTOSCOPY WITH LITHALOPAXY;  Surgeon: Alexis Frock, MD;  [facility];  Service: Urology;  Laterality: N/A;   INSERTION OF SUPRAPUBIC CATHETER N/A 04/04/2013   Procedure: INSERTION OF SUPRAPUBIC CATHETER;  Surgeon: Alexis Frock, MD;  [facility];  Service: Urology;  Laterality: N/A;   LAPAROTOMY  07/20/2011   Procedure: EXPLORATORY LAPAROTOMY;  Surgeon: Judieth Keens, DO;  Location: [facility];  Service: General;  Laterality: N/A;   myeloma thoracic T8 with parpaplegia s/p thoracotomy and thoracic T7-9 cage placement on Dec 26th 2011  07/18/10   Social History   Occupational History   Not on file  Tobacco Use   Smoking status: Never   Smokeless tobacco: Never  Vaping Use   Vaping Use: Never used  Substance and Sexual Activity   Alcohol use: No   Drug use: No   Sexual activity: Never

## 2021-07-15 ENCOUNTER — Telehealth: Payer: Self-pay | Admitting: Hematology and Oncology

## 2021-07-15 NOTE — Telephone Encounter (Signed)
Scheduled per 12/23 RN request, pts wife has been called and confirmed this appt

## 2021-07-29 ENCOUNTER — Ambulatory Visit: Payer: Medicare (Managed Care)

## 2021-07-29 ENCOUNTER — Other Ambulatory Visit: Payer: Medicare (Managed Care)

## 2021-07-29 ENCOUNTER — Ambulatory Visit: Payer: Medicare (Managed Care) | Admitting: Physician Assistant

## 2021-08-03 ENCOUNTER — Inpatient Hospital Stay (HOSPITAL_BASED_OUTPATIENT_CLINIC_OR_DEPARTMENT_OTHER): Payer: Medicare (Managed Care) | Admitting: Physician Assistant

## 2021-08-03 ENCOUNTER — Other Ambulatory Visit: Payer: Self-pay | Admitting: Hematology and Oncology

## 2021-08-03 ENCOUNTER — Other Ambulatory Visit: Payer: Self-pay

## 2021-08-03 ENCOUNTER — Inpatient Hospital Stay: Payer: Medicare (Managed Care) | Attending: Physician Assistant

## 2021-08-03 ENCOUNTER — Inpatient Hospital Stay: Payer: Medicare (Managed Care)

## 2021-08-03 VITALS — BP 108/74 | HR 106 | Temp 97.2°F | Resp 18 | Wt 242.1 lb

## 2021-08-03 VITALS — HR 89

## 2021-08-03 DIAGNOSIS — Z5112 Encounter for antineoplastic immunotherapy: Secondary | ICD-10-CM | POA: Diagnosis not present

## 2021-08-03 DIAGNOSIS — C9002 Multiple myeloma in relapse: Secondary | ICD-10-CM

## 2021-08-03 DIAGNOSIS — Z7189 Other specified counseling: Secondary | ICD-10-CM

## 2021-08-03 LAB — CMP (CANCER CENTER ONLY)
ALT: 15 U/L (ref 0–44)
AST: 14 U/L — ABNORMAL LOW (ref 15–41)
Albumin: 3.9 g/dL (ref 3.5–5.0)
Alkaline Phosphatase: 84 U/L (ref 38–126)
Anion gap: 8 (ref 5–15)
BUN: 9 mg/dL (ref 8–23)
CO2: 28 mmol/L (ref 22–32)
Calcium: 9.2 mg/dL (ref 8.9–10.3)
Chloride: 105 mmol/L (ref 98–111)
Creatinine: 0.83 mg/dL (ref 0.61–1.24)
GFR, Estimated: >60 mL/min (ref >60)
Glucose, Bld: 140 mg/dL — ABNORMAL HIGH (ref 70–99)
Potassium: 3.8 mmol/L (ref 3.5–5.1)
Sodium: 141 mmol/L (ref 135–145)
Total Bilirubin: 0.7 mg/dL (ref 0.3–1.2)
Total Protein: 7.2 g/dL (ref 6.5–8.1)

## 2021-08-03 LAB — CBC WITH DIFFERENTIAL (CANCER CENTER ONLY)
Abs Immature Granulocytes: 0.02 10*3/uL (ref 0.00–0.07)
Basophils Absolute: 0 10*3/uL (ref 0.0–0.1)
Basophils Relative: 1 %
Eosinophils Absolute: 0.3 10*3/uL (ref 0.0–0.5)
Eosinophils Relative: 3 %
HCT: 35.4 % — ABNORMAL LOW (ref 39.0–52.0)
Hemoglobin: 12.8 g/dL — ABNORMAL LOW (ref 13.0–17.0)
Immature Granulocytes: 0 %
Lymphocytes Relative: 15 %
Lymphs Abs: 1.3 10*3/uL (ref 0.7–4.0)
MCH: 28.4 pg (ref 26.0–34.0)
MCHC: 36.2 g/dL — ABNORMAL HIGH (ref 30.0–36.0)
MCV: 78.7 fL — ABNORMAL LOW (ref 80.0–100.0)
Monocytes Absolute: 0.7 10*3/uL (ref 0.1–1.0)
Monocytes Relative: 8 %
Neutro Abs: 6.4 10*3/uL (ref 1.7–7.7)
Neutrophils Relative %: 73 %
Platelet Count: 370 10*3/uL (ref 150–400)
RBC: 4.5 MIL/uL (ref 4.22–5.81)
RDW: 16 % — ABNORMAL HIGH (ref 11.5–15.5)
WBC Count: 8.8 10*3/uL (ref 4.0–10.5)
nRBC: 0 % (ref 0.0–0.2)

## 2021-08-03 LAB — LACTATE DEHYDROGENASE: LDH: 103 U/L (ref 98–192)

## 2021-08-03 MED ORDER — DIPHENHYDRAMINE HCL 25 MG PO CAPS
25.0000 mg | ORAL_CAPSULE | Freq: Once | ORAL | Status: AC
  Administered 2021-08-03: 25 mg via ORAL
  Filled 2021-08-03: qty 1

## 2021-08-03 MED ORDER — DARATUMUMAB-HYALURONIDASE-FIHJ 1800-30000 MG-UT/15ML ~~LOC~~ SOLN
1800.0000 mg | Freq: Once | SUBCUTANEOUS | Status: AC
  Administered 2021-08-03: 1800 mg via SUBCUTANEOUS
  Filled 2021-08-03: qty 15

## 2021-08-03 MED ORDER — DEXAMETHASONE 4 MG PO TABS
20.0000 mg | ORAL_TABLET | Freq: Once | ORAL | Status: AC
  Administered 2021-08-03: 20 mg via ORAL
  Filled 2021-08-03: qty 5

## 2021-08-03 MED ORDER — ACETAMINOPHEN 325 MG PO TABS
650.0000 mg | ORAL_TABLET | Freq: Once | ORAL | Status: AC
  Administered 2021-08-03: 650 mg via ORAL
  Filled 2021-08-03: qty 2

## 2021-08-03 NOTE — Patient Instructions (Signed)
[facility]  Discharge Instructions: Thank you for choosing [facility] to provide your oncology and hematology care.   If you have a lab appointment with the [facility], please go directly to the [facility] and check in at the registration area.   Wear comfortable clothing and clothing appropriate for easy access to any Portacath or PICC line.   We strive to give you quality time with your provider. You may need to reschedule your appointment if you arrive late (15 or more minutes).  Arriving late affects you and other patients whose appointments are after yours.  Also, if you miss three or more appointments without notifying the office, you may be dismissed from the clinic at the providers discretion.      For prescription refill requests, have your pharmacy contact our office and allow 72 hours for refills to be completed.    Today you received the following chemotherapy and/or immunotherapy agents: Darzalex Faspro   To help prevent nausea and vomiting after your treatment, we encourage you to take your nausea medication as directed.  BELOW ARE SYMPTOMS THAT SHOULD BE REPORTED IMMEDIATELY: *FEVER GREATER THAN 100.4 F (38 C) OR HIGHER *CHILLS OR SWEATING *NAUSEA AND VOMITING THAT IS NOT CONTROLLED WITH YOUR NAUSEA MEDICATION *UNUSUAL SHORTNESS OF BREATH *UNUSUAL BRUISING OR BLEEDING *URINARY PROBLEMS (pain or burning when urinating, or frequent urination) *BOWEL PROBLEMS (unusual diarrhea, constipation, pain near the anus) TENDERNESS IN MOUTH AND THROAT WITH OR WITHOUT PRESENCE OF ULCERS (sore throat, sores in mouth, or a toothache) UNUSUAL RASH, SWELLING OR PAIN  UNUSUAL VAGINAL DISCHARGE OR ITCHING   Items with * indicate a potential emergency and should be followed up as soon as possible or go to the Emergency Department if any problems should occur.  Please show the CHEMOTHERAPY ALERT CARD or IMMUNOTHERAPY ALERT CARD at check-in  to the Emergency Department and triage nurse.  Should you have questions after your visit or need to cancel or reschedule your appointment, please contact [facility]  Dept: [phone]  and follow the prompts.  Office hours are 8:00 a.m. to 4:30 p.m. Monday - Friday. Please note that voicemails left after 4:00 p.m. may not be returned until the following business day.  We are closed weekends and major holidays. You have access to a nurse at all times for urgent questions. Please call the main number to the clinic Dept: [phone] and follow the prompts.   For any non-urgent questions, you may also contact your provider using MyChart. We now offer e-Visits for anyone 68 and older to request care online for non-urgent symptoms. For details visit [email or web address].   Also download the MyChart app! Go to the app store, search "MyChart", open the app, select [facility], and log in with your MyChart username and password.  Due to Covid, a mask is required upon entering the hospital/clinic. If you do not have a mask, one will be given to you upon arrival. For doctor visits, patients may have 1 support person aged 68 or older with them. For treatment visits, patients cannot have anyone with them due to current Covid guidelines and our immunocompromised population.

## 2021-08-03 NOTE — Progress Notes (Signed)
[facility] [phone]   [phone]  PROGRESS NOTE  Patient Care Team: [name], MD as PCP - General (Family Medicine) [name], MD as Consulting Physician (Physical Medicine and Rehabilitation) Donia Guiles Lavon Paganini, PA-C as Physician Assistant (Physical Medicine and Rehabilitation) [name], MD as Consulting Physician (Hematology and Oncology)  Hematological/Oncological History # IgG Lambda Multiple Myeloma, Relapsed (ISS Stage II) 1) 06/2010: initial diagnosis of Multiple Myeloma after T8 compression fracture. Treated with Velcade/Revlimid/Dexamethasone and achieved a complete remission 2) Velcade was discontinued in September 2012 and that Revlimid and Decadron were discontinued in March 2013. 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 4) Followed by Dr. Alvy Bimler, last clinic visit 10/09/2019. At that time there was concern for relapse of his multiple myeloma.  5) Patient requested transfer to different provider after misunderstanding regarding imaging studies 6) 12/17/2019: transfer care to Dr. Lorenso Courier  7) 01/09/2020: Cycle 1 Day 1 of Dara/Velcade/Dex 8) 01/21/2020: presented as urgent visit for diarrhea and dehydration. Holding chemotherapy scheduled for 01/23/2020. 9) 01/30/2020: Resume dara/velcade/dex after resolution of diarrhea.  10) 02/13/2020: restaging labs show M protein 0.8, Kappa 4.5, lamba 17.2, ratio 0.26, urine M protein 53 (7.1%). All MM labs indicate improvement.  11) 03/10/2020: Cycle 4 Day 1 of Dara/Velcade/Dex. Transition to q 3 week daratumumab.  12) 06/04/2020:  Cycle 8 Day 1 of Dara/Velcade/Dex 13) 06/25/2020:  Cycle 9 Day 1 of Dara/Velcade/Dex 14) 07/22/2020: Cycle 10 Day 1 of  Dara//Dex 15) 08/18/2020: Cycle 11 Day 1 of Dara//Dex 16) 09/23/2020: Cycle 12 Day 1 of Dara//Dex 17) 10/22/2020: Cycle 13 Day 1 of Dara/Dex  18)  11/19/2020: Cycle 14 Day 1 of Dara/Dex  19) 12/17/2020: Cycle 15 Day 1 of Dara/Dex  20) 01/17/2021: Cycle 16 Day 1 of Dara/Dex  21) 02/11/2021: Cycle 17 Day 1 of Dara/Dex 22) 03/10/2021: Cycle 18 Day 1 of Dara/Dex  23) 04/08/2021: Cycle 19 Day 1 of Dara/Dex  24) 08/03/2021: Cycle 20 Day 1 of Dara/Dex (delayed due to scheduling error)  Interval History:  [name] 66 y.o. male with medical history significant for  IgG Lambda Multiple Myeloma who presents for a follow up visit. The patient's last visit was on 04/08/2021. Due to scheduling error, patent has not been seen for visits and treatment. He presents today to resume treatment with cycle 20 of Dara/Dex.   On exam today [name] reports that he has been doing well without any changes to his health. His energy and appetite are stable. He denies any nausea, vomiting or abdominal pain. His ostomy output has been normal and he has noticed no urinary symptoms.He denies easy bruising or signs of bleeding. He denies any fevers, chills, night sweats, shortness of breath, chest pain or cough. He has no other complaints.A full 10 point ROS is listed below.  MEDICAL HISTORY:  Past Medical History:  Diagnosis Date   Adrenal insufficiency ([place])    on chronic dexamethasone   Anemia    Cancer (HCC)    Coagulopathy (HCC)    on xeralto/ s/p DVT while on coumadin,  IVC in place   Diabetes mellitus without complication ([place])    type 2   Gross hematuria 7/14   post foley cath procedure   History of blood transfusion 7/14   Multiple myeloma    thoracic T8 with paraplegia s/p resection- on chemo at visit 10/13/10   Multiple myeloma  Multiple myeloma without mention of remission    Neurogenic bladder    Neurogenic bowel    Paraplegia (HCC)    Partial small bowel obstruction ([place]) during dec 2011 admission    SURGICAL HISTORY: Past Surgical History:  Procedure Laterality Date   COLONOSCOPY WITH PROPOFOL N/A 04/12/2017   Procedure: COLONOSCOPY WITH  PROPOFOL;  Surgeon: Geovanny Sartin Shipper, MD;  [facility];  Service: Endoscopy;  Laterality: N/A;   COLONOSCOPY WITH PROPOFOL N/A 04/19/2017   Procedure: COLONOSCOPY WITH PROPOFOL;  Surgeon: Yetta Flock, MD;  [facility];  Service: Gastroenterology;  Laterality: N/A;   COLOSTOMY  07/20/2011   Procedure: COLOSTOMY;  Surgeon: Judieth Keens, DO;  Location: [facility];  Service: General;;   COLOSTOMY REVISION  07/20/2011   Procedure: COLON RESECTION SIGMOID;  Surgeon: Judieth Keens, DO;  Location: [facility];  Service: General;;   CYSTOSCOPY N/A 04/04/2013   Procedure: CYSTOSCOPY WITH LITHALOPAXY;  Surgeon: Alexis Frock, MD;  [facility];  Service: Urology;  Laterality: N/A;   INSERTION OF SUPRAPUBIC CATHETER N/A 04/04/2013   Procedure: INSERTION OF SUPRAPUBIC CATHETER;  Surgeon: Alexis Frock, MD;  [facility];  Service: Urology;  Laterality: N/A;   LAPAROTOMY  07/20/2011   Procedure: EXPLORATORY LAPAROTOMY;  Surgeon: Judieth Keens, DO;  Location: [facility];  Service: General;  Laterality: N/A;   myeloma thoracic T8 with parpaplegia s/p thoracotomy and thoracic T7-9 cage placement on Dec 26th 2011  07/18/10    SOCIAL HISTORY: Social History   Socioeconomic History   Marital status: Married    Spouse name: Not on file   Number of children: Not on file   Years of education: Not on file   Highest education level: Not on file  Occupational History   Not on file  Tobacco Use   Smoking status: Never   Smokeless tobacco: Never  Vaping Use   Vaping Use: Never used  Substance and Sexual Activity   Alcohol use: No   Drug use: No   Sexual activity: Never  Other Topics Concern   Not on file  Social History Narrative   Not on file   Social Determinants of Health   Financial Resource Strain: Not on file  Food Insecurity: Not on file  Transportation Needs: Not on file  Physical Activity: Not on file  Stress: Not on file  Social Connections: Not on  file  Intimate Partner Violence: Not on file    FAMILY HISTORY: Family History  Problem Relation Age of Onset   Ovarian cancer Mother    Diabetes Father     ALLERGIES:  is allergic to feraheme [ferumoxytol].  MEDICATIONS:  Current Outpatient Medications  Medication Sig Dispense Refill   acyclovir (ZOVIRAX) 400 MG tablet Take 1 tablet (400 mg total) by mouth 2 (two) times daily. 60 tablet 2   atorvastatin (LIPITOR) 10 MG tablet Take 10 mg by mouth daily.     baclofen (LIORESAL) 20 MG tablet Take 20 mg by mouth 2 (two) times daily.     diazepam (DIASTAT ACUDIAL) 10 MG GEL SMARTSIG:By Mouth     dorzolamide (TRUSOPT) 2 % ophthalmic solution 1 drop 2 (two) times daily.     iron polysaccharides (NU-IRON) 150 MG capsule Take 1 capsule (150 mg total) by mouth daily. 30 capsule 0   latanoprost (XALATAN) 0.005 % ophthalmic solution Place 1 drop into both eyes at bedtime.     ondansetron (ZOFRAN) 8 MG tablet Take 1 tablet (8 mg total) by  mouth every 8 (eight) hours as needed for nausea or vomiting. 30 tablet 0   rivaroxaban (XARELTO) 10 MG TABS tablet Take 1 tablet (10 mg total) by mouth daily with supper. 30 tablet 9   Skin Protectants, Misc. (EUCERIN) cream Apply 1 application topically 2 (two) times daily as needed for dry skin.      zinc oxide (BALMEX) 11.3 % CREA cream Apply 1 application topically 2 (two) times daily.     No current facility-administered medications for this visit.   Facility-Administered Medications Ordered in Other Visits  Medication Dose Route Frequency Provider Last Rate Last Admin   sodium chloride flush (NS) 0.9 % injection 10 mL  10 mL Intravenous PRN Alvy Bimler, Ni, MD        REVIEW OF SYSTEMS:   Constitutional: ( - ) fevers, ( - )  chills , ( - ) night sweats Eyes: ( - ) blurriness of vision, ( - ) double vision, ( - ) watery eyes Ears, nose, mouth, throat, and face: ( - ) mucositis, ( - ) sore throat Respiratory: ( - ) cough, ( - ) dyspnea, ( - )  wheezes Cardiovascular: ( - ) palpitation, ( - ) chest discomfort, ( - ) lower extremity swelling Gastrointestinal:  ( - ) nausea, ( - ) heartburn, ( - ) change in bowel habits Skin: ( - ) abnormal skin rashes Lymphatics: ( - ) new lymphadenopathy, ( - ) easy bruising Neurological: ( - ) numbness, ( - ) tingling, ( - ) new weaknesses Behavioral/Psych: ( - ) mood change, ( - ) new changes  All other systems were reviewed with the patient and are negative.  PHYSICAL EXAMINATION: ECOG PERFORMANCE STATUS: paraplegic.   Vitals:   08/03/21 1037  BP: 108/74  Pulse: (!) 106  Resp: 18  Temp: (!) 97.2 F (36.2 C)  SpO2: 99%     Filed Weights   08/03/21 1037  Weight: 242 lb 1.6 oz (109.8 kg)    GENERAL: well appearing middle aged [place] American male alert, no distress and comfortable SKIN: skin color, texture, turgor are normal, no rashes or significant lesions EYES: conjunctiva are pink and non-injected, sclera clear LUNGS: clear to auscultation and percussion with normal breathing effort HEART: regular rate & rhythm and no murmurs and no lower extremity edema Musculoskeletal: no cyanosis of digits and no clubbing  PSYCH: alert & oriented x 3, fluent speech NEURO: paraplegic, no use of LE bilaterally.   LABORATORY DATA:  I have reviewed the data as listed CBC Latest Ref Rng & Units 08/03/2021 04/08/2021 03/10/2021  WBC 4.0 - 10.5 K/uL 8.8 8.3 8.3  Hemoglobin 13.0 - 17.0 g/dL 12.8(L) 13.9 13.3  Hematocrit 39.0 - 52.0 % 35.4(L) 40.5 37.5(L)  Platelets 150 - 400 K/uL 370 357 353    CMP Latest Ref Rng & Units 04/08/2021 03/10/2021 02/11/2021  Glucose 70 - 99 mg/dL 125(H) 100(H) 135(H)  BUN 8 - 23 mg/dL 7(L) 14 11  Creatinine 0.61 - 1.24 mg/dL 0.86 0.90 0.78  Sodium 135 - 145 mmol/L 143 142 142  Potassium 3.5 - 5.1 mmol/L 4.0 3.7 3.6  Chloride 98 - 111 mmol/L 106 107 108  CO2 22 - 32 mmol/L 27 27 27   Calcium 8.9 - 10.3 mg/dL 9.9 9.4 9.1  Total Protein 6.5 - 8.1 g/dL 7.6 7.0 7.1   Total Bilirubin 0.3 - 1.2 mg/dL 0.5 0.4 0.6  Alkaline Phos 38 - 126 U/L 117 105 88  AST 15 - 41 U/L 20  18 19  ALT 0 - 44 U/L 25 19 21     Lab Results  Component Value Date   MPROTEIN 0.6 (H) 04/08/2021   MPROTEIN 0.5 (H) 03/10/2021   MPROTEIN 0.4 (H) 02/11/2021   Lab Results  Component Value Date   KPAFRELGTCHN 7.9 04/08/2021   KPAFRELGTCHN 7.2 03/10/2021   KPAFRELGTCHN 7.6 02/11/2021   LAMBDASER 18.8 04/08/2021   LAMBDASER 18.4 03/10/2021   LAMBDASER 10.0 02/11/2021   KAPLAMBRATIO 0.42 04/08/2021   KAPLAMBRATIO 0.39 03/10/2021   KAPLAMBRATIO 0.76 02/11/2021    RADIOGRAPHIC STUDIES: No results found.  ASSESSMENT & PLAN [name] 66 y.o. male with medical history significant for  IgG Lambda Multiple Myeloma who presents for a follow up visit.  After review of the labs, discussion with the patient, and reviewed the imaging his findings are most consistent with a relapsed multiple myeloma.The patient has had excellent success before with treatment of his myeloma with Velcade, Revlimid, and dexamethasone.  Treatment regimen consists of bortezomib 1.69m/m2 subq (Day 1,8, and 15), daratumumab 1800 mg subq (day 1,8 and 15), and dexamethasone 20 mg PO (day 1, 8 and 15). After 3 cycles, daratumumab to be administered q 3 weeks (from cycles 4-8). After cycle 8, continue daratumumab q 4 weeks alone (N Engl J Med 2016; [id number]:[id number]  # IgG Lambda Multiple Myeloma, Relapsed (ISS Stage II) --findings are most consistent with relapsed multiple myeloma. Patient previously successfully treated with Velcade/Rev/Dex --current plan is Daratumumab/Velcade/Dex  (started 01/09/2020). On 07/02/2020 he transitioned to monthly daratumumab alone. Continue q 4 week dara. --labs today were reviewed and adequate for treatment.  --patient will proceed with Cycle 20 today.  --return for start of Cycle 21 on 09/02/2020  #History of DVT --He had placement of IVC filter, remains on Xarelto. --Due to poor  mobility, and lack of bleeding complications, I recommend he remain on Xarelto indefinitely. --caution if Plt count were to drop <50  # Supportive Care -- provided patient with an albuterol inhaler (for use with daratumumab) --acyclovir 4026mBID for VZV prophylaxis --zofran 95m25m8H PRN for nausea/vomiting  --Zometa can be considered after dental clearance, though with his prior episode of osteonecrosis the risk may outweigh the benefits. --patient recieved pretreatment RBC phenotype  No orders of the defined types were placed in this encounter.   All questions were answered. The patient knows to call the clinic with any problems, questions or concerns.  I have spent a total of 30 minutes minutes of face-to-face and non-face-to-face time, preparing to see the patient,  performing a medically appropriate examination, counseling and educating the patient, communicating with other health care professionals, documenting clinical information in the electronic health record,  and care coordination.   IreDede Query-C [facility] of [facility] and [facility]cAll[facility]: [phone] 08/03/2021 10:48 AM   Literature Support:  PalLaurene Footmanooka AK, Masszi T, BekMaderapiPeavine HunBode Munder M, MatWaucomaarSunland Parki M, Schecter J, AmiHartwellin X, Deraedt W, Ahmadi T, Spencer A, Sonneveld P; [employer] Investigators. Daratumumab, Bortezomib, and Dexamethasone for Multiple Myeloma. N EAlta Corningd. 2016 Aug 25;375(8):754-66  --among patients with relapsed or relapsed and refractory multiple myeloma, daratumumab in combination with bortezomib and dexamethasone resulted in significantly longer progression-free survival than bortezomib and dexamethasone alone and was associated with infusion-related reactions and higher rates of thrombocytopenia and neutropenia than bortezomib and dexamethasone alone.

## 2021-08-04 ENCOUNTER — Telehealth: Payer: Self-pay | Admitting: *Deleted

## 2021-08-04 ENCOUNTER — Ambulatory Visit: Payer: Self-pay

## 2021-08-04 ENCOUNTER — Ambulatory Visit (INDEPENDENT_AMBULATORY_CARE_PROVIDER_SITE_OTHER): Payer: Medicare (Managed Care) | Admitting: Orthopedic Surgery

## 2021-08-04 ENCOUNTER — Ambulatory Visit (INDEPENDENT_AMBULATORY_CARE_PROVIDER_SITE_OTHER): Payer: Medicare (Managed Care)

## 2021-08-04 DIAGNOSIS — M899 Disorder of bone, unspecified: Secondary | ICD-10-CM

## 2021-08-04 NOTE — Telephone Encounter (Signed)
(  Continued) Pt called our on call staff with the afore mentioned symptoms and was told to take Benadryl. He also called EMS but the symptoms were receding by the time they arrived to pt's home. Jannet Askew, PA said she will be seeing pt today and would like to know if there is any additional treatment pt needs for hives. Dr. Lorenso Courier recommends continuing benadryl for now.  [name] voiced understanding.

## 2021-08-04 NOTE — Telephone Encounter (Signed)
Received call from [facility], Jannet Askew, NP. She states that pt experienced widespread hives and facial swelling and redness. Pt had called our on call

## 2021-08-05 ENCOUNTER — Encounter: Payer: Self-pay | Admitting: Orthopedic Surgery

## 2021-08-05 NOTE — Progress Notes (Signed)
Office Visit Note   Patient: [name]           Date of Birth: Aug 02, 1955           MRN: [id number] Visit Date: 08/04/2021              Requested by: [name], MD [address] [place],  [place] [address] PCP: [name], MD  Chief Complaint  Patient presents with   Right Leg - Follow-up    Bilateral femur lytic lesion    Left Leg - Follow-up      HPI: Patient is a 66 year old gentleman with lytic metastatic lesions of both femurs.  Patient is asymptomatic.  Patient states he is recently started back on chemotherapy and did have a reaction to the medication.  Assessment & Plan: Visit Diagnoses:  1. Lytic bone lesion of right femur   2. Lytic bone lesion of left femur     Plan: The lytic lesions are stable.  We will follow-up in 6 months with repeat radiographs of both proximal femurs.  Follow-Up Instructions: Return in about 3 months (around 11/02/2021).   Ortho Exam  Patient is alert, oriented, no adenopathy, well-dressed, normal affect, normal respiratory effort. Examination this time there is no symptoms the lytic lesions are stable without progression.  Imaging: No results found. No images are attached to the encounter.  Labs: Lab Results  Component Value Date   HGBA1C 6.0 (H) 07/22/2014   ESRSEDRATE 73 (H) 10/21/2013   ESRSEDRATE 55 (H) 07/23/2013   ESRSEDRATE 95 (H) 07/13/2010   CRP 1.0 (H) 07/14/2010   LABURIC 8.2 (H) 07/21/2014   LABURIC 5.1 09/02/2010   REPTSTATUS 01/25/2020 FINAL 01/21/2020   CULT (A) 01/21/2020    >=100,000 COLONIES/mL PROTEUS MIRABILIS 20,000 COLONIES/mL STAPHYLOCOCCUS AUREUS    LABORGA PROTEUS MIRABILIS (A) 01/21/2020   LABORGA STAPHYLOCOCCUS AUREUS (A) 01/21/2020     Lab Results  Component Value Date   ALBUMIN 3.9 08/03/2021   ALBUMIN 3.8 04/08/2021   ALBUMIN 3.5 03/10/2021    Lab Results  Component Value Date   MG 1.8 01/21/2020   MG 1.5 07/21/2014   MG 1.3 (L) 02/13/2013   Lab Results   Component Value Date   VD25OH 59 04/08/2013   VD25OH 58 01/16/2013   VD25OH 63 11/15/2012    No results found for: PREALBUMIN CBC EXTENDED Latest Ref Rng & Units 08/03/2021 04/08/2021 03/10/2021  WBC 4.0 - 10.5 K/uL 8.8 8.3 8.3  RBC 4.22 - 5.81 MIL/uL 4.50 5.10 4.88  HGB 13.0 - 17.0 g/dL 12.8(L) 13.9 13.3  HCT 39.0 - 52.0 % 35.4(L) 40.5 37.5(L)  PLT 150 - 400 K/uL 370 357 353  NEUTROABS 1.7 - 7.7 K/uL 6.4 6.1 5.7  LYMPHSABS 0.7 - 4.0 K/uL 1.3 1.1 1.3     There is no height or weight on file to calculate BMI.  Orders:  Orders Placed This Encounter  Procedures   XR FEMUR, MIN 2 VIEWS RIGHT   XR FEMUR MIN 2 VIEWS LEFT   No orders of the defined types were placed in this encounter.    Procedures: No procedures performed  Clinical Data: No additional findings.  ROS:  All other systems negative, except as noted in the HPI. Review of Systems  Objective: Vital Signs: There were no vitals taken for this visit.  Specialty Comments:  No specialty comments available.  PMFS History: Patient Active Problem List   Diagnosis Date Noted   Goals of care, counseling/discussion 10/09/2019  On rivaroxaban therapy    Status post colonoscopy with polypectomy    Lower GI bleeding 04/18/2017   Acute blood loss anemia 04/18/2017   Diabetes mellitus type 2 in obese ([facility] [place]) 04/18/2017   Colon cancer screening    Benign neoplasm of ascending colon    History of DVT (deep vein thrombosis) 06/10/2015   Obstructed suprapubic catheter ([facility]) 07/21/2014   Bilateral hydronephrosis 07/21/2014   Acute kidney failure ([facility]) 07/20/2014   Abdominal pain 07/20/2014   AKI (acute kidney injury) ([place] [facility]) 07/20/2014   Hematuria 02/12/2013   Shock ([facility]) 02/12/2013   Adrenal insufficiency ([facility]) 02/12/2013   Hypoalbuminemia 11/15/2012   Anemia, unspecified 09/18/2012   Unspecified vitamin D deficiency 09/18/2012   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   Iron deficiency  anemia 09/18/2012   Diverticulitis of large intestine with perforation 07/26/2011   Multiple myeloma in relapse ([facility]) 10/14/2010   Neurogenic bowel 10/14/2010   Paraplegia ([facility]) 07/13/2010   Neurogenic bladder 07/11/2010   BACK PAIN, LUMBAR, WITH RADICULOPATHY 07/07/2010   DISEQUILIBRIUM 07/07/2010   Abdominal pain, generalized 07/07/2010   OBESITY, NOS 09/20/2006   Past Medical History:  Diagnosis Date   Adrenal insufficiency (HCC)    on chronic dexamethasone   Anemia    Cancer (HCC)    Coagulopathy (HCC)    on xeralto/ s/p DVT while on coumadin,  IVC in place   Diabetes mellitus without complication ([facility])    type 2   Gross hematuria 7/14   post foley cath procedure   History of blood transfusion 7/14   Multiple myeloma    thoracic T8 with paraplegia s/p resection- on chemo at visit 10/13/10   Multiple myeloma    Multiple myeloma without mention of remission    Neurogenic bladder    Neurogenic bowel    Paraplegia (HCC)    Partial small bowel obstruction ([facility]) during dec 2011 admission    Family History  Problem Relation Age of Onset   Ovarian cancer Mother    Diabetes Father     Past Surgical History:  Procedure Laterality Date   COLONOSCOPY WITH PROPOFOL N/A 04/12/2017   Procedure: COLONOSCOPY WITH PROPOFOL;  Surgeon: Irene Shipper, MD;  [facility];  Service: Endoscopy;  Laterality: N/A;   COLONOSCOPY WITH PROPOFOL N/A 04/19/2017   Procedure: COLONOSCOPY WITH PROPOFOL;  Surgeon: Yetta Flock, MD;  [facility];  Service: Gastroenterology;  Laterality: N/A;   COLOSTOMY  07/20/2011   Procedure: COLOSTOMY;  Surgeon: Judieth Keens, DO;  Location: [facility];  Service: General;;   COLOSTOMY REVISION  07/20/2011   Procedure: COLON RESECTION SIGMOID;  Surgeon: Judieth Keens, DO;  Location: [facility];  Service: General;;   CYSTOSCOPY N/A 04/04/2013   Procedure: CYSTOSCOPY WITH LITHALOPAXY;  Surgeon: Alexis Frock, MD;  [facility];  Service:  Urology;  Laterality: N/A;   INSERTION OF SUPRAPUBIC CATHETER N/A 04/04/2013   Procedure: INSERTION OF SUPRAPUBIC CATHETER;  Surgeon: Alexis Frock, MD;  [facility];  Service: Urology;  Laterality: N/A;   LAPAROTOMY  07/20/2011   Procedure: EXPLORATORY LAPAROTOMY;  Surgeon: Judieth Keens, DO;  Location: [facility];  Service: General;  Laterality: N/A;   myeloma thoracic T8 with parpaplegia s/p thoracotomy and thoracic T7-9 cage placement on Dec 26th 2011  07/18/10   Social History   Occupational History   Not on file  Tobacco Use   Smoking status: Never   Smokeless tobacco: Never  Vaping Use  Vaping Use: Never used  Substance and Sexual Activity   Alcohol use: No   Drug use: No   Sexual activity: Never

## 2021-08-31 ENCOUNTER — Other Ambulatory Visit: Payer: Medicare (Managed Care)

## 2021-08-31 ENCOUNTER — Ambulatory Visit: Payer: Medicare (Managed Care) | Admitting: Hematology and Oncology

## 2021-08-31 ENCOUNTER — Ambulatory Visit: Payer: Medicare (Managed Care)

## 2021-09-02 ENCOUNTER — Other Ambulatory Visit: Payer: Self-pay | Admitting: Hematology and Oncology

## 2021-09-02 ENCOUNTER — Other Ambulatory Visit: Payer: Self-pay

## 2021-09-02 ENCOUNTER — Inpatient Hospital Stay: Payer: Medicare (Managed Care)

## 2021-09-02 ENCOUNTER — Inpatient Hospital Stay: Payer: Medicare (Managed Care) | Attending: Physician Assistant

## 2021-09-02 ENCOUNTER — Inpatient Hospital Stay (HOSPITAL_BASED_OUTPATIENT_CLINIC_OR_DEPARTMENT_OTHER): Payer: Medicare (Managed Care) | Admitting: Hematology and Oncology

## 2021-09-02 VITALS — BP 124/81 | HR 66 | Temp 97.8°F | Resp 18 | Ht 68.0 in | Wt 249.2 lb

## 2021-09-02 DIAGNOSIS — Z5112 Encounter for antineoplastic immunotherapy: Secondary | ICD-10-CM | POA: Diagnosis not present

## 2021-09-02 DIAGNOSIS — C9002 Multiple myeloma in relapse: Secondary | ICD-10-CM | POA: Insufficient documentation

## 2021-09-02 DIAGNOSIS — G8220 Paraplegia, unspecified: Secondary | ICD-10-CM

## 2021-09-02 DIAGNOSIS — Z7901 Long term (current) use of anticoagulants: Secondary | ICD-10-CM | POA: Diagnosis not present

## 2021-09-02 DIAGNOSIS — Z7189 Other specified counseling: Secondary | ICD-10-CM

## 2021-09-02 LAB — CBC WITH DIFFERENTIAL (CANCER CENTER ONLY)
Abs Immature Granulocytes: 0.02 10*3/uL (ref 0.00–0.07)
Basophils Absolute: 0 10*3/uL (ref 0.0–0.1)
Basophils Relative: 0 %
Eosinophils Absolute: 0.3 10*3/uL (ref 0.0–0.5)
Eosinophils Relative: 4 %
HCT: 35.9 % — ABNORMAL LOW (ref 39.0–52.0)
Hemoglobin: 12.3 g/dL — ABNORMAL LOW (ref 13.0–17.0)
Immature Granulocytes: 0 %
Lymphocytes Relative: 18 %
Lymphs Abs: 1.2 10*3/uL (ref 0.7–4.0)
MCH: 28.1 pg (ref 26.0–34.0)
MCHC: 34.3 g/dL (ref 30.0–36.0)
MCV: 82 fL (ref 80.0–100.0)
Monocytes Absolute: 0.4 10*3/uL (ref 0.1–1.0)
Monocytes Relative: 6 %
Neutro Abs: 5 10*3/uL (ref 1.7–7.7)
Neutrophils Relative %: 72 %
Platelet Count: 342 10*3/uL (ref 150–400)
RBC: 4.38 MIL/uL (ref 4.22–5.81)
RDW: 16.8 % — ABNORMAL HIGH (ref 11.5–15.5)
WBC Count: 6.9 10*3/uL (ref 4.0–10.5)
nRBC: 0 % (ref 0.0–0.2)

## 2021-09-02 LAB — CMP (CANCER CENTER ONLY)
ALT: 18 U/L (ref 0–44)
AST: 16 U/L (ref 15–41)
Albumin: 3.9 g/dL (ref 3.5–5.0)
Alkaline Phosphatase: 92 U/L (ref 38–126)
Anion gap: 5 (ref 5–15)
BUN: 11 mg/dL (ref 8–23)
CO2: 29 mmol/L (ref 22–32)
Calcium: 9.3 mg/dL (ref 8.9–10.3)
Chloride: 109 mmol/L (ref 98–111)
Creatinine: 0.75 mg/dL (ref 0.61–1.24)
GFR, Estimated: >60 mL/min (ref >60)
Glucose, Bld: 126 mg/dL — ABNORMAL HIGH (ref 70–99)
Potassium: 3.8 mmol/L (ref 3.5–5.1)
Sodium: 143 mmol/L (ref 135–145)
Total Bilirubin: 0.5 mg/dL (ref 0.3–1.2)
Total Protein: 7.1 g/dL (ref 6.5–8.1)

## 2021-09-02 LAB — LACTATE DEHYDROGENASE: LDH: 89 U/L — ABNORMAL LOW (ref 98–192)

## 2021-09-02 MED ORDER — DIPHENHYDRAMINE HCL 25 MG PO CAPS
25.0000 mg | ORAL_CAPSULE | Freq: Once | ORAL | Status: AC
  Administered 2021-09-02: 25 mg via ORAL
  Filled 2021-09-02: qty 1

## 2021-09-02 MED ORDER — DEXAMETHASONE 4 MG PO TABS
20.0000 mg | ORAL_TABLET | Freq: Once | ORAL | Status: AC
  Administered 2021-09-02: 20 mg via ORAL
  Filled 2021-09-02: qty 5

## 2021-09-02 MED ORDER — DARATUMUMAB-HYALURONIDASE-FIHJ 1800-30000 MG-UT/15ML ~~LOC~~ SOLN
1800.0000 mg | Freq: Once | SUBCUTANEOUS | Status: AC
  Administered 2021-09-02: 1800 mg via SUBCUTANEOUS
  Filled 2021-09-02: qty 15

## 2021-09-02 MED ORDER — ACETAMINOPHEN 325 MG PO TABS
650.0000 mg | ORAL_TABLET | Freq: Once | ORAL | Status: AC
  Administered 2021-09-02: 650 mg via ORAL
  Filled 2021-09-02: qty 2

## 2021-09-02 NOTE — Progress Notes (Signed)
[facility] [phone]   Fax:([phone]) [phone]  PROGRESS NOTE  Patient Care Team: [name], MD as PCP - General (Family Medicine) [name], MD as Consulting Physician (Physical Medicine and Rehabilitation) Donia Guiles Lavon Paganini, PA-C as Physician Assistant (Physical Medicine and Rehabilitation) [name], MD as Consulting Physician (Hematology and Oncology)  Hematological/Oncological History # IgG Lambda Multiple Myeloma, Relapsed (ISS Stage II) 1) 06/2010: initial diagnosis of Multiple Myeloma after T8 compression fracture. Treated with Velcade/Revlimid/Dexamethasone and achieved a complete remission 2) Velcade was discontinued in September 2012 and that Revlimid and Decadron were discontinued in March 2013. 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 4) Followed by Dr. Alvy Bimler, last clinic visit 10/09/2019. At that time there was concern for relapse of his multiple myeloma.  5) Patient requested transfer to different provider after misunderstanding regarding imaging studies 6) 12/17/2019: transfer care to Dr. Lorenso Courier  7) 01/09/2020: Cycle 1 Day 1 of Dara/Velcade/Dex 8) 01/21/2020: presented as urgent visit for diarrhea and dehydration. Holding chemotherapy scheduled for 01/23/2020. 9) 01/30/2020: Resume dara/velcade/dex after resolution of diarrhea.  10) 02/13/2020: restaging labs show M protein 0.8, Kappa 4.5, lamba 17.2, ratio 0.26, urine M protein 53 (7.1%). All MM labs indicate improvement.  11) 03/10/2020: Cycle 4 Day 1 of Dara/Velcade/Dex. Transition to q 3 week daratumumab.  12) 06/04/2020:  Cycle 8 Day 1 of Dara/Velcade/Dex 13) 06/25/2020:  Cycle 9 Day 1 of Dara/Velcade/Dex 14) 07/22/2020: Cycle 10 Day 1 of  Dara//Dex 15) 08/18/2020: Cycle 11 Day 1 of Dara//Dex 16) 09/23/2020: Cycle 12 Day 1 of Dara//Dex 17) 10/22/2020: Cycle 13 Day 1 of Dara/Dex  18)  11/19/2020: Cycle 14 Day 1 of Dara/Dex  19) 12/17/2020: Cycle 15 Day 1 of Dara/Dex  20) 01/17/2021: Cycle 16 Day 1 of Dara/Dex  21) 02/11/2021: Cycle 17 Day 1 of Dara/Dex 22) 03/10/2021: Cycle 18 Day 1 of Dara/Dex  23) 04/08/2021: Cycle 19 Day 1 of Dara/Dex  24) 08/03/2021: Cycle 20 Day 1 of Dara/Dex (delayed due to scheduling error) 25) 09/02/2021: Cycle 21 Day 1 of Dara/Dex  Interval History:  [name] 66 y.o. male with medical history significant for  IgG Lambda Multiple Myeloma who presents for a follow up visit. The patient's last visit was on 08/03/2021.  He presents today to resume treatment with cycle 21 of Dara/Dex.   On exam today [name] reports he has been well overall interim since her last visit.  Fortunately he did have some unusual reactions to his last daratumumab shot.  He notes he developed a rash as well as nausea with his last treatment.  Fortunately that resolved relatively quickly.  He notes that his energy levels have been good and his appetite is strong.  He denies any nausea, vomiting or abdominal pain. His ostomy output has been normal and he has noticed no urinary symptoms.He denies easy bruising or signs of bleeding. He denies any fevers, chills, night sweats, shortness of breath, chest pain or cough. He has no other complaints.A full 10 point ROS is listed below.  MEDICAL HISTORY:  Past Medical History:  Diagnosis Date   Adrenal insufficiency ([facility])    on chronic dexamethasone   Anemia    Cancer (HCC)    Coagulopathy (HCC)    on xeralto/ s/p DVT while on coumadin,  IVC in place   Diabetes mellitus without complication ([facility])    type 2   Gross hematuria  7/14   post foley cath procedure   History of blood transfusion 7/14   Multiple myeloma    thoracic T8 with paraplegia s/p resection- on chemo at visit 10/13/10   Multiple myeloma    Multiple myeloma without mention of remission    Neurogenic bladder    Neurogenic bowel    Paraplegia ([facility])    Partial small  bowel obstruction ([facility]) during dec 2011 admission    SURGICAL HISTORY: Past Surgical History:  Procedure Laterality Date   COLONOSCOPY WITH PROPOFOL N/A 04/12/2017   Procedure: COLONOSCOPY WITH PROPOFOL;  Surgeon: Irene Shipper, MD;  Location: [facility];  Service: Endoscopy;  Laterality: N/A;   COLONOSCOPY WITH PROPOFOL N/A 04/19/2017   Procedure: COLONOSCOPY WITH PROPOFOL;  Surgeon: Yetta Flock, MD;  Location: [facility];  Service: Gastroenterology;  Laterality: N/A;   COLOSTOMY  07/20/2011   Procedure: COLOSTOMY;  Surgeon: Judieth Keens, DO;  Location: [facility];  Service: General;;   COLOSTOMY REVISION  07/20/2011   Procedure: COLON RESECTION SIGMOID;  Surgeon: Judieth Keens, DO;  Location: [facility];  Service: General;;   CYSTOSCOPY N/A 04/04/2013   Procedure: CYSTOSCOPY WITH LITHALOPAXY;  Surgeon: Alexis Frock, MD;  Location: [facility];  Service: Urology;  Laterality: N/A;   INSERTION OF SUPRAPUBIC CATHETER N/A 04/04/2013   Procedure: INSERTION OF SUPRAPUBIC CATHETER;  Surgeon: Alexis Frock, MD;  Location: [facility];  Service: Urology;  Laterality: N/A;   LAPAROTOMY  07/20/2011   Procedure: EXPLORATORY LAPAROTOMY;  Surgeon: Judieth Keens, DO;  Location: [facility];  Service: General;  Laterality: N/A;   myeloma thoracic T8 with parpaplegia s/p thoracotomy and thoracic T7-9 cage placement on Dec 26th 2011  07/18/10    SOCIAL HISTORY: Social History   Socioeconomic History   Marital status: Married    Spouse name: Not on file   Number of children: Not on file   Years of education: Not on file   Highest education level: Not on file  Occupational History   Not on file  Tobacco Use   Smoking status: Never   Smokeless tobacco: Never  Vaping Use   Vaping Use: Never used  Substance and Sexual Activity   Alcohol use: No   Drug use: No   Sexual activity: Never  Other Topics Concern   Not on file  Social History Narrative   Not on file   Social Determinants of  Health   Financial Resource Strain: Not on file  Food Insecurity: Not on file  Transportation Needs: Not on file  Physical Activity: Not on file  Stress: Not on file  Social Connections: Not on file  Intimate Partner Violence: Not on file    FAMILY HISTORY: Family History  Problem Relation Age of Onset   Ovarian cancer Mother    Diabetes Father     ALLERGIES:  is allergic to feraheme [ferumoxytol].  MEDICATIONS:  Current Outpatient Medications  Medication Sig Dispense Refill   acyclovir (ZOVIRAX) 400 MG tablet Take 1 tablet (400 mg total) by mouth 2 (two) times daily. 60 tablet 2   atorvastatin (LIPITOR) 10 MG tablet Take 10 mg by mouth daily.     baclofen (LIORESAL) 20 MG tablet Take 20 mg by mouth 2 (two) times daily.     diazepam (DIASTAT ACUDIAL) 10 MG GEL SMARTSIG:By Mouth     dorzolamide (TRUSOPT) 2 % ophthalmic solution 1 drop 2 (two) times daily.     iron polysaccharides (NU-IRON) 150 MG capsule Take 1 capsule (150 mg  total) by mouth daily. 30 capsule 0   latanoprost (XALATAN) 0.005 % ophthalmic solution Place 1 drop into both eyes at bedtime.     ondansetron (ZOFRAN) 8 MG tablet Take 1 tablet (8 mg total) by mouth every 8 (eight) hours as needed for nausea or vomiting. 30 tablet 0   rivaroxaban (XARELTO) 10 MG TABS tablet Take 1 tablet (10 mg total) by mouth daily with supper. 30 tablet 9   Skin Protectants, Misc. (EUCERIN) cream Apply 1 application topically 2 (two) times daily as needed for dry skin.      zinc oxide (BALMEX) 11.3 % CREA cream Apply 1 application topically 2 (two) times daily.     No current facility-administered medications for this visit.   Facility-Administered Medications Ordered in Other Visits  Medication Dose Route Frequency Provider Last Rate Last Admin   sodium chloride flush (NS) 0.9 % injection 10 mL  10 mL Intravenous PRN Alvy Bimler, Ni, MD        REVIEW OF SYSTEMS:   Constitutional: ( - ) fevers, ( - )  chills , ( - ) night sweats Eyes:  ( - ) blurriness of vision, ( - ) double vision, ( - ) watery eyes Ears, nose, mouth, throat, and face: ( - ) mucositis, ( - ) sore throat Respiratory: ( - ) cough, ( - ) dyspnea, ( - ) wheezes Cardiovascular: ( - ) palpitation, ( - ) chest discomfort, ( - ) lower extremity swelling Gastrointestinal:  ( - ) nausea, ( - ) heartburn, ( - ) change in bowel habits Skin: ( - ) abnormal skin rashes Lymphatics: ( - ) new lymphadenopathy, ( - ) easy bruising Neurological: ( - ) numbness, ( - ) tingling, ( - ) new weaknesses Behavioral/Psych: ( - ) mood change, ( - ) new changes  All other systems were reviewed with the patient and are negative.  PHYSICAL EXAMINATION: ECOG PERFORMANCE STATUS: paraplegic.   Vitals:   09/02/21 1106  BP: 124/81  Pulse: 66  Resp: 18  Temp: 97.8 F (36.6 C)  SpO2: 100%      Filed Weights   09/02/21 1106  Weight: 249 lb 3.2 oz (113 kg)     GENERAL: well appearing middle aged [place] American male alert, no distress and comfortable SKIN: skin color, texture, turgor are normal, no rashes or significant lesions EYES: conjunctiva are pink and non-injected, sclera clear LUNGS: clear to auscultation and percussion with normal breathing effort HEART: regular rate & rhythm and no murmurs and no lower extremity edema Musculoskeletal: no cyanosis of digits and no clubbing  PSYCH: alert & oriented x 3, fluent speech NEURO: paraplegic, no use of LE bilaterally.   LABORATORY DATA:  I have reviewed the data as listed CBC Latest Ref Rng & Units 09/02/2021 08/03/2021 04/08/2021  WBC 4.0 - 10.5 K/uL 6.9 8.8 8.3  Hemoglobin 13.0 - 17.0 g/dL 12.3(L) 12.8(L) 13.9  Hematocrit 39.0 - 52.0 % 35.9(L) 35.4(L) 40.5  Platelets 150 - 400 K/uL 342 370 357    CMP Latest Ref Rng & Units 09/02/2021 08/03/2021 04/08/2021  Glucose 70 - 99 mg/dL 126(H) 140(H) 125(H)  BUN 8 - 23 mg/dL 11 9 7(L)  Creatinine 0.61 - 1.24 mg/dL 0.75 0.83 0.86  Sodium 135 - 145 mmol/L 143 141 143   Potassium 3.5 - 5.1 mmol/L 3.8 3.8 4.0  Chloride 98 - 111 mmol/L 109 105 106  CO2 22 - 32 mmol/L 29 28 27   Calcium 8.9 - 10.3 mg/dL 9.3  9.2 9.9  Total Protein 6.5 - 8.1 g/dL 7.1 7.2 7.6  Total Bilirubin 0.3 - 1.2 mg/dL 0.5 0.7 0.5  Alkaline Phos 38 - 126 U/L 92 84 117  AST 15 - 41 U/L 16 14(L) 20  ALT 0 - 44 U/L 18 15 25     Lab Results  Component Value Date   MPROTEIN 0.6 (H) 04/08/2021   MPROTEIN 0.5 (H) 03/10/2021   MPROTEIN 0.4 (H) 02/11/2021   Lab Results  Component Value Date   KPAFRELGTCHN 7.9 04/08/2021   KPAFRELGTCHN 7.2 03/10/2021   KPAFRELGTCHN 7.6 02/11/2021   LAMBDASER 18.8 04/08/2021   LAMBDASER 18.4 03/10/2021   LAMBDASER 10.0 02/11/2021   KAPLAMBRATIO 0.42 04/08/2021   KAPLAMBRATIO 0.39 03/10/2021   KAPLAMBRATIO 0.76 02/11/2021    RADIOGRAPHIC STUDIES: No results found.  ASSESSMENT & PLAN [name] 66 y.o. male with medical history significant for  IgG Lambda Multiple Myeloma who presents for a follow up visit.  After review of the labs, discussion with the patient, and reviewed the imaging his findings are most consistent with a relapsed multiple myeloma.The patient has had excellent success before with treatment of his myeloma with Velcade, Revlimid, and dexamethasone.  Treatment regimen consists of bortezomib 1.86m/m2 subq (Day 1,8, and 15), daratumumab 1800 mg subq (day 1,8 and 15), and dexamethasone 20 mg PO (day 1, 8 and 15). After 3 cycles, daratumumab to be administered q 3 weeks (from cycles 4-8). After cycle 8, continue daratumumab q 4 weeks alone (N Engl J Med 2016; [id number]  # IgG Lambda Multiple Myeloma, Relapsed (ISS Stage II) --findings are most consistent with relapsed multiple myeloma. Patient previously successfully treated with Velcade/Rev/Dex --current plan is Daratumumab/Velcade/Dex  (started 01/09/2020). On 07/02/2020 he transitioned to monthly daratumumab alone. Continue q 4 week dara. --labs today were reviewed and adequate  for treatment.  --patient will proceed with Cycle 22 today.  --return for start of Cycle 22 on 09/30/2020  #History of DVT --He had placement of IVC filter, remains on Xarelto. --Due to poor mobility, and lack of bleeding complications, I recommend he remain on Xarelto indefinitely. --caution if Plt count were to drop <50  # Supportive Care -- provided patient with an albuterol inhaler (for use with daratumumab) --acyclovir 4072mBID for VZV prophylaxis --zofran 40m33m8H PRN for nausea/vomiting  --Zometa can be considered after dental clearance, though with his prior episode of osteonecrosis the risk may outweigh the benefits. --patient recieved pretreatment RBC phenotype  No orders of the defined types were placed in this encounter.   All questions were answered. The patient knows to call the clinic with any problems, questions or concerns.  I have spent a total of 30 minutes minutes of face-to-face and non-face-to-face time, preparing to see the patient,  performing a medically appropriate examination, counseling and educating the patient, communicating with other health care professionals, documenting clinical information in the electronic health record,  and care coordination.   JohLedell PeoplesD Department of Hematology/Oncology [facility]: [phone]: [phone]: [email or web address]  09/03/2021 5:44 PM   Literature Support:  PalArchie Endoeisel K, Raliegh Ipooka AK, Masszi T, [place]eland[place] Munder M, Mat[place]ebbet[place]ark TM, Qi M, Schecter J, AmiRainbow Cityin X, Deraedt W, Ahmadi T, Spencer A, Sonneveld P; [employer] Investigators. Daratumumab, Bortezomib, and Dexamethasone for Multiple Myeloma. N EAlta Corningd. 2016 Aug 25;375(8):754-66  --among patients with relapsed or relapsed and refractory multiple myeloma,  daratumumab in combination with bortezomib and dexamethasone resulted in significantly longer  progression-free survival than bortezomib and dexamethasone alone and was associated with infusion-related reactions and higher rates of thrombocytopenia and neutropenia than bortezomib and dexamethasone alone.

## 2021-09-02 NOTE — Patient Instructions (Signed)
[facility]  Discharge Instructions: Thank you for choosing [facility] to provide your oncology and hematology care.   If you have a lab appointment with the [facility], please go directly to the [facility] and check in at the registration area.   Wear comfortable clothing and clothing appropriate for easy access to any Portacath or PICC line.   We strive to give you quality time with your provider. You may need to reschedule your appointment if you arrive late (15 or more minutes).  Arriving late affects you and other patients whose appointments are after yours.  Also, if you miss three or more appointments without notifying the office, you may be dismissed from the clinic at the providers discretion.      For prescription refill requests, have your pharmacy contact our office and allow 72 hours for refills to be completed.    Today you received the following chemotherapy and/or immunotherapy agents: Darzalex Faspro   To help prevent nausea and vomiting after your treatment, we encourage you to take your nausea medication as directed.  BELOW ARE SYMPTOMS THAT SHOULD BE REPORTED IMMEDIATELY: *FEVER GREATER THAN 100.4 F (38 C) OR HIGHER *CHILLS OR SWEATING *NAUSEA AND VOMITING THAT IS NOT CONTROLLED WITH YOUR NAUSEA MEDICATION *UNUSUAL SHORTNESS OF BREATH *UNUSUAL BRUISING OR BLEEDING *URINARY PROBLEMS (pain or burning when urinating, or frequent urination) *BOWEL PROBLEMS (unusual diarrhea, constipation, pain near the anus) TENDERNESS IN MOUTH AND THROAT WITH OR WITHOUT PRESENCE OF ULCERS (sore throat, sores in mouth, or a toothache) UNUSUAL RASH, SWELLING OR PAIN  UNUSUAL VAGINAL DISCHARGE OR ITCHING   Items with * indicate a potential emergency and should be followed up as soon as possible or go to the Emergency Department if any problems should occur.  Please show the CHEMOTHERAPY ALERT CARD or IMMUNOTHERAPY ALERT CARD at check-in  to the Emergency Department and triage nurse.  Should you have questions after your visit or need to cancel or reschedule your appointment, please contact [facility]  Dept: [phone]  and follow the prompts.  Office hours are 8:00 a.m. to 4:30 p.m. Monday - Friday. Please note that voicemails left after 4:00 p.m. may not be returned until the following business day.  We are closed weekends and major holidays. You have access to a nurse at all times for urgent questions. Please call the main number to the clinic Dept: [phone] and follow the prompts.   For any non-urgent questions, you may also contact your provider using MyChart. We now offer e-Visits for anyone 31 and older to request care online for non-urgent symptoms. For details visit [email or web address].   Also download the MyChart app! Go to the app store, search "MyChart", open the app, select [facility], and log in with your MyChart username and password.  Due to Covid, a mask is required upon entering the hospital/clinic. If you do not have a mask, one will be given to you upon arrival. For doctor visits, patients may have 1 support person aged 57 or older with them. For treatment visits, patients cannot have anyone with them due to current Covid guidelines and our immunocompromised population.

## 2021-09-03 ENCOUNTER — Encounter: Payer: Self-pay | Admitting: Hematology and Oncology

## 2021-09-05 ENCOUNTER — Telehealth: Payer: Self-pay | Admitting: Hematology and Oncology

## 2021-09-05 LAB — KAPPA/LAMBDA LIGHT CHAINS
Kappa free light chain: 6 mg/L (ref 3.3–19.4)
Kappa, lambda light chain ratio: 0.1 — ABNORMAL LOW (ref 0.26–1.65)
Lambda free light chains: 61.1 mg/L — ABNORMAL HIGH (ref 5.7–26.3)

## 2021-09-05 NOTE — Telephone Encounter (Signed)
Scheduled per 2/10 los, calender has been mailed to pt

## 2021-09-06 LAB — MULTIPLE MYELOMA PANEL, SERUM
Albumin SerPl Elph-Mcnc: 3.2 g/dL (ref 2.9–4.4)
Albumin/Glob SerPl: 1.1 (ref 0.7–1.7)
Alpha 1: 0.3 g/dL (ref 0.0–0.4)
Alpha2 Glob SerPl Elph-Mcnc: 0.9 g/dL (ref 0.4–1.0)
B-Globulin SerPl Elph-Mcnc: 0.9 g/dL (ref 0.7–1.3)
Gamma Glob SerPl Elph-Mcnc: 1.1 g/dL (ref 0.4–1.8)
Globulin, Total: 3.2 g/dL (ref 2.2–3.9)
IgA: 20 mg/dL — ABNORMAL LOW (ref 61–437)
IgG (Immunoglobin G), Serum: 1361 mg/dL (ref 603–1613)
IgM (Immunoglobulin M), Srm: 10 mg/dL — ABNORMAL LOW (ref 20–172)
M Protein SerPl Elph-Mcnc: 0.8 g/dL — ABNORMAL HIGH
Total Protein ELP: 6.4 g/dL (ref 6.0–8.5)

## 2021-09-16 ENCOUNTER — Other Ambulatory Visit: Payer: Self-pay | Admitting: Hematology and Oncology

## 2021-09-28 ENCOUNTER — Other Ambulatory Visit: Payer: Medicare (Managed Care)

## 2021-09-28 ENCOUNTER — Ambulatory Visit: Payer: Medicare (Managed Care) | Admitting: Physician Assistant

## 2021-09-28 ENCOUNTER — Ambulatory Visit: Payer: Medicare (Managed Care)

## 2021-09-29 ENCOUNTER — Other Ambulatory Visit: Payer: Self-pay | Admitting: Hematology and Oncology

## 2021-09-30 ENCOUNTER — Inpatient Hospital Stay: Payer: Medicare (Managed Care)

## 2021-09-30 ENCOUNTER — Other Ambulatory Visit: Payer: Self-pay

## 2021-09-30 ENCOUNTER — Inpatient Hospital Stay (HOSPITAL_BASED_OUTPATIENT_CLINIC_OR_DEPARTMENT_OTHER): Payer: Medicare (Managed Care) | Admitting: Physician Assistant

## 2021-09-30 ENCOUNTER — Inpatient Hospital Stay: Payer: Medicare (Managed Care) | Attending: Physician Assistant

## 2021-09-30 VITALS — BP 119/71 | HR 94 | Temp 97.2°F | Resp 18 | Ht 68.0 in | Wt 251.0 lb

## 2021-09-30 DIAGNOSIS — C9002 Multiple myeloma in relapse: Secondary | ICD-10-CM

## 2021-09-30 DIAGNOSIS — Z7189 Other specified counseling: Secondary | ICD-10-CM

## 2021-09-30 DIAGNOSIS — Z86718 Personal history of other venous thrombosis and embolism: Secondary | ICD-10-CM | POA: Diagnosis not present

## 2021-09-30 DIAGNOSIS — Z5112 Encounter for antineoplastic immunotherapy: Secondary | ICD-10-CM | POA: Diagnosis not present

## 2021-09-30 DIAGNOSIS — Z7901 Long term (current) use of anticoagulants: Secondary | ICD-10-CM | POA: Insufficient documentation

## 2021-09-30 LAB — CBC WITH DIFFERENTIAL (CANCER CENTER ONLY)
Abs Immature Granulocytes: 0.03 10*3/uL (ref 0.00–0.07)
Basophils Absolute: 0 10*3/uL (ref 0.0–0.1)
Basophils Relative: 1 %
Eosinophils Absolute: 0.2 10*3/uL (ref 0.0–0.5)
Eosinophils Relative: 3 %
HCT: 36.6 % — ABNORMAL LOW (ref 39.0–52.0)
Hemoglobin: 12.6 g/dL — ABNORMAL LOW (ref 13.0–17.0)
Immature Granulocytes: 1 %
Lymphocytes Relative: 17 %
Lymphs Abs: 1.1 10*3/uL (ref 0.7–4.0)
MCH: 28.4 pg (ref 26.0–34.0)
MCHC: 34.4 g/dL (ref 30.0–36.0)
MCV: 82.6 fL (ref 80.0–100.0)
Monocytes Absolute: 0.4 10*3/uL (ref 0.1–1.0)
Monocytes Relative: 7 %
Neutro Abs: 4.5 10*3/uL (ref 1.7–7.7)
Neutrophils Relative %: 71 %
Platelet Count: 330 10*3/uL (ref 150–400)
RBC: 4.43 MIL/uL (ref 4.22–5.81)
RDW: 17.5 % — ABNORMAL HIGH (ref 11.5–15.5)
WBC Count: 6.3 10*3/uL (ref 4.0–10.5)
nRBC: 0 % (ref 0.0–0.2)

## 2021-09-30 LAB — CMP (CANCER CENTER ONLY)
ALT: 25 U/L (ref 0–44)
AST: 18 U/L (ref 15–41)
Albumin: 3.9 g/dL (ref 3.5–5.0)
Alkaline Phosphatase: 108 U/L (ref 38–126)
Anion gap: 6 (ref 5–15)
BUN: 12 mg/dL (ref 8–23)
CO2: 29 mmol/L (ref 22–32)
Calcium: 9.3 mg/dL (ref 8.9–10.3)
Chloride: 107 mmol/L (ref 98–111)
Creatinine: 0.81 mg/dL (ref 0.61–1.24)
GFR, Estimated: >60 mL/min (ref >60)
Glucose, Bld: 169 mg/dL — ABNORMAL HIGH (ref 70–99)
Potassium: 3.6 mmol/L (ref 3.5–5.1)
Sodium: 142 mmol/L (ref 135–145)
Total Bilirubin: 0.5 mg/dL (ref 0.3–1.2)
Total Protein: 7.3 g/dL (ref 6.5–8.1)

## 2021-09-30 LAB — LACTATE DEHYDROGENASE: LDH: 88 U/L — ABNORMAL LOW (ref 98–192)

## 2021-09-30 MED ORDER — DEXAMETHASONE 4 MG PO TABS
20.0000 mg | ORAL_TABLET | Freq: Once | ORAL | Status: AC
  Administered 2021-09-30: 20 mg via ORAL
  Filled 2021-09-30: qty 5

## 2021-09-30 MED ORDER — ACETAMINOPHEN 325 MG PO TABS
650.0000 mg | ORAL_TABLET | Freq: Once | ORAL | Status: AC
  Administered 2021-09-30: 650 mg via ORAL
  Filled 2021-09-30: qty 2

## 2021-09-30 MED ORDER — DARATUMUMAB-HYALURONIDASE-FIHJ 1800-30000 MG-UT/15ML ~~LOC~~ SOLN
1800.0000 mg | Freq: Once | SUBCUTANEOUS | Status: AC
  Administered 2021-09-30: 1800 mg via SUBCUTANEOUS
  Filled 2021-09-30: qty 15

## 2021-09-30 MED ORDER — DIPHENHYDRAMINE HCL 25 MG PO CAPS
25.0000 mg | ORAL_CAPSULE | Freq: Once | ORAL | Status: AC
  Administered 2021-09-30: 25 mg via ORAL
  Filled 2021-09-30: qty 1

## 2021-09-30 NOTE — Patient Instructions (Signed)
[facility]   ?Discharge Instructions: ?Thank you for choosing [facility] to provide your oncology and hematology care.  ? ?If you have a lab appointment with the [facility], please go directly to the [facility] and check in at the registration area. ?  ?Wear comfortable clothing and clothing appropriate for easy access to any Portacath or PICC line.  ? ?We strive to give you quality time with your provider. You may need to reschedule your appointment if you arrive late (15 or more minutes).  Arriving late affects you and other patients whose appointments are after yours.  Also, if you miss three or more appointments without notifying the office, you may be dismissed from the clinic at the provider?s discretion.    ?  ?For prescription refill requests, have your pharmacy contact our office and allow 72 hours for refills to be completed.   ? ?Today you received the following chemotherapy and/or immunotherapy agents: daratumumab-hyaluronidase    ?  ?To help prevent nausea and vomiting after your treatment, we encourage you to take your nausea medication as directed. ? ?BELOW ARE SYMPTOMS THAT SHOULD BE REPORTED IMMEDIATELY: ?*FEVER GREATER THAN 100.4 F (38 ?C) OR HIGHER ?*CHILLS OR SWEATING ?*NAUSEA AND VOMITING THAT IS NOT CONTROLLED WITH YOUR NAUSEA MEDICATION ?*UNUSUAL SHORTNESS OF BREATH ?*UNUSUAL BRUISING OR BLEEDING ?*URINARY PROBLEMS (pain or burning when urinating, or frequent urination) ?*BOWEL PROBLEMS (unusual diarrhea, constipation, pain near the anus) ?TENDERNESS IN MOUTH AND THROAT WITH OR WITHOUT PRESENCE OF ULCERS (sore throat, sores in mouth, or a toothache) ?UNUSUAL RASH, SWELLING OR PAIN  ?UNUSUAL VAGINAL DISCHARGE OR ITCHING  ? ?Items with * indicate a potential emergency and should be followed up as soon as possible or go to the Emergency Department if any problems should occur. ? ?Please show the CHEMOTHERAPY ALERT CARD or IMMUNOTHERAPY ALERT  CARD at check-in to the Emergency Department and triage nurse. ? ?Should you have questions after your visit or need to cancel or reschedule your appointment, please contact [facility]  Dept: [phone]  and follow the prompts.  Office hours are 8:00 a.m. to 4:30 p.m. Monday - Friday. Please note that voicemails left after 4:00 p.m. may not be returned until the following business day.  We are closed weekends and major holidays. You have access to a nurse at all times for urgent questions. Please call the main number to the clinic Dept: [phone] and follow the prompts. ? ? ?For any non-urgent questions, you may also contact your provider using MyChart. We now offer e-Visits for anyone 55 and older to request care online for non-urgent symptoms. For details visit [email or web address]. ?  ?Also download the MyChart app! Go to the app store, search "MyChart", open the app, select [facility], and log in with your MyChart username and password. ? ?Due to Covid, a mask is required upon entering the hospital/clinic. If you do not have a mask, one will be given to you upon arrival. For doctor visits, patients may have 1 support person aged 81 or older with them. For treatment visits, patients cannot have anyone with them due to current Covid guidelines and our immunocompromised population.  ? ?

## 2021-09-30 NOTE — Progress Notes (Signed)
[place] [phone]   Fax:([phone]  PROGRESS NOTE  Patient Care Team: [name], MD as PCP - General (Family Medicine) [name], MD as Consulting Physician (Physical Medicine and Rehabilitation) Donia Guiles Lavon Paganini, PA-C as Physician Assistant (Physical Medicine and Rehabilitation) Orson Slick, MD as Consulting Physician (Hematology and Oncology)  Hematological/Oncological History # IgG Lambda Multiple Myeloma, Relapsed (ISS Stage II) 1) 06/2010: initial diagnosis of Multiple Myeloma after T8 compression fracture. Treated with Velcade/Revlimid/Dexamethasone and achieved a complete remission 2) Velcade was discontinued in September 2012 and that Revlimid and Decadron were discontinued in March 2013. 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 4) Followed by Dr. Alvy Bimler, last clinic visit 10/09/2019. At that time there was concern for relapse of his multiple myeloma.  5) Patient requested transfer to different provider after misunderstanding regarding imaging studies 6) 12/17/2019: transfer care to Dr. Lorenso Courier  7) 01/09/2020: Cycle 1 Day 1 of Dara/Velcade/Dex 8) 01/21/2020: presented as urgent visit for diarrhea and dehydration. Holding chemotherapy scheduled for 01/23/2020. 9) 01/30/2020: Resume dara/velcade/dex after resolution of diarrhea.  10) 02/13/2020: restaging labs show M protein 0.8, Kappa 4.5, lamba 17.2, ratio 0.26, urine M protein 53 (7.1%). All MM labs indicate improvement.  11) 03/10/2020: Cycle 4 Day 1 of Dara/Velcade/Dex. Transition to q 3 week daratumumab.  12) 06/04/2020:  Cycle 8 Day 1 of Dara/Velcade/Dex 13) 06/25/2020:  Cycle 9 Day 1 of Dara/Velcade/Dex 14) 07/22/2020: Cycle 10 Day 1 of  Dara//Dex 15) 08/18/2020: Cycle 11 Day 1 of Dara//Dex 16) 09/23/2020: Cycle 12 Day 1 of Dara//Dex 17) 10/22/2020: Cycle 13 Day 1 of Dara/Dex   18) 11/19/2020: Cycle 14 Day 1 of Dara/Dex  19) 12/17/2020: Cycle 15 Day 1 of Dara/Dex  20) 01/17/2021: Cycle 16 Day 1 of Dara/Dex  21) 02/11/2021: Cycle 17 Day 1 of Dara/Dex 22) 03/10/2021: Cycle 18 Day 1 of Dara/Dex  23) 04/08/2021: Cycle 19 Day 1 of Dara/Dex  24) 08/03/2021: Cycle 20 Day 1 of Dara/Dex (delayed due to scheduling error) 25) 09/02/2021: Cycle 21 Day 1 of Dara/Dex 26) 09/30/2021: Cycle 22 Day 1 of Dara/Dex  Interval History:  [name] 66 y.o. male with medical history significant for  IgG Lambda Multiple Myeloma who presents for a follow up visit. The patient's last visit was on 09/02/2021.  He presents today to resume treatment with cycle 22 of Dara/Dex.   On exam today [name] continues to do well without any changes to his health. He reports that he tolerated his last treatment of daratumumab without any significant limitations. His energy and appetite are fairly stable. He denies any nausea, vomiting or abdominal pain. He has good output from his ostomy and denies any urinary symptoms. He denies easy bruising or signs of bleeding. He denies any fevers, chills, night sweats, shortness of breath, chest pain or cough. He has no other complaints.A full 10 point ROS is listed below.  MEDICAL HISTORY:  Past Medical History:  Diagnosis Date   Adrenal insufficiency ([place])    on chronic dexamethasone   Anemia    Cancer ([place])    Coagulopathy ([facility])    on xeralto/ s/p DVT while on coumadin,  IVC in place   Diabetes mellitus without complication ([place])    type 2   Gross hematuria 7/14   post foley cath procedure   History of blood transfusion 7/14   Multiple myeloma  thoracic T8 with paraplegia s/p resection- on chemo at visit 10/13/10   Multiple myeloma    Multiple myeloma without mention of remission    Neurogenic bladder    Neurogenic bowel    Paraplegia (HCC)    Partial small bowel obstruction ([place]) during dec 2011 admission    SURGICAL HISTORY: Past Surgical  History:  Procedure Laterality Date   COLONOSCOPY WITH PROPOFOL N/A 04/12/2017   Procedure: COLONOSCOPY WITH PROPOFOL;  Surgeon: Yula Crotwell Shipper, MD;  [facility];  Service: Endoscopy;  Laterality: N/A;   COLONOSCOPY WITH PROPOFOL N/A 04/19/2017   Procedure: COLONOSCOPY WITH PROPOFOL;  Surgeon: Yetta Flock, MD;  [facility];  Service: Gastroenterology;  Laterality: N/A;   COLOSTOMY  07/20/2011   Procedure: COLOSTOMY;  Surgeon: Judieth Keens, DO;  Location: [place];  Service: General;;   COLOSTOMY REVISION  07/20/2011   Procedure: COLON RESECTION SIGMOID;  Surgeon: Judieth Keens, DO;  Location: [place];  Service: General;;   CYSTOSCOPY N/A 04/04/2013   Procedure: CYSTOSCOPY WITH LITHALOPAXY;  Surgeon: Alexis Frock, MD;  [facility];  Service: Urology;  Laterality: N/A;   INSERTION OF SUPRAPUBIC CATHETER N/A 04/04/2013   Procedure: INSERTION OF SUPRAPUBIC CATHETER;  Surgeon: Alexis Frock, MD;  [facility];  Service: Urology;  Laterality: N/A;   LAPAROTOMY  07/20/2011   Procedure: EXPLORATORY LAPAROTOMY;  Surgeon: Judieth Keens, DO;  Location: [place];  Service: General;  Laterality: N/A;   myeloma thoracic T8 with parpaplegia s/p thoracotomy and thoracic T7-9 cage placement on Dec 26th 2011  07/18/10    SOCIAL HISTORY: Social History   Socioeconomic History   Marital status: Married    Spouse name: Not on file   Number of children: Not on file   Years of education: Not on file   Highest education level: Not on file  Occupational History   Not on file  Tobacco Use   Smoking status: Never   Smokeless tobacco: Never  Vaping Use   Vaping Use: Never used  Substance and Sexual Activity   Alcohol use: No   Drug use: No   Sexual activity: Never  Other Topics Concern   Not on file  Social History Narrative   Not on file   Social Determinants of Health   Financial Resource Strain: Not on file  Food Insecurity: Not on file   Transportation Needs: Not on file  Physical Activity: Not on file  Stress: Not on file  Social Connections: Not on file  Intimate Partner Violence: Not on file    FAMILY HISTORY: Family History  Problem Relation Age of Onset   Ovarian cancer Mother    Diabetes Father     ALLERGIES:  is allergic to feraheme [ferumoxytol].  MEDICATIONS:  Current Outpatient Medications  Medication Sig Dispense Refill   acyclovir (ZOVIRAX) 400 MG tablet Take 1 tablet (400 mg total) by mouth 2 (two) times daily. 60 tablet 2   atorvastatin (LIPITOR) 10 MG tablet Take 10 mg by mouth daily.     baclofen (LIORESAL) 20 MG tablet Take 20 mg by mouth 2 (two) times daily.     diazepam (DIASTAT ACUDIAL) 10 MG GEL SMARTSIG:By Mouth     dorzolamide (TRUSOPT) 2 % ophthalmic solution 1 drop 2 (two) times daily.     iron polysaccharides (NU-IRON) 150 MG capsule Take 1 capsule (150 mg total) by mouth daily. 30 capsule 0   latanoprost (XALATAN) 0.005 % ophthalmic solution Place 1 drop into both eyes  at bedtime.     ondansetron (ZOFRAN) 8 MG tablet Take 1 tablet (8 mg total) by mouth every 8 (eight) hours as needed for nausea or vomiting. 30 tablet 0   rivaroxaban (XARELTO) 10 MG TABS tablet Take 1 tablet (10 mg total) by mouth daily with supper. 30 tablet 9   Skin Protectants, Misc. (EUCERIN) cream Apply 1 application topically 2 (two) times daily as needed for dry skin.      zinc oxide (BALMEX) 11.3 % CREA cream Apply 1 application topically 2 (two) times daily.     No current facility-administered medications for this visit.   Facility-Administered Medications Ordered in Other Visits  Medication Dose Route Frequency Provider Last Rate Last Admin   sodium chloride flush (NS) 0.9 % injection 10 mL  10 mL Intravenous PRN Alvy Bimler, Ni, MD        REVIEW OF SYSTEMS:   Constitutional: ( - ) fevers, ( - )  chills , ( - ) night sweats Eyes: ( - ) blurriness of vision, ( - ) double vision, ( - ) watery eyes Ears, nose,  mouth, throat, and face: ( - ) mucositis, ( - ) sore throat Respiratory: ( - ) cough, ( - ) dyspnea, ( - ) wheezes Cardiovascular: ( - ) palpitation, ( - ) chest discomfort, ( - ) lower extremity swelling Gastrointestinal:  ( - ) nausea, ( - ) heartburn, ( - ) change in bowel habits Skin: ( - ) abnormal skin rashes Lymphatics: ( - ) new lymphadenopathy, ( - ) easy bruising Neurological: ( - ) numbness, ( - ) tingling, ( - ) new weaknesses Behavioral/Psych: ( - ) mood change, ( - ) new changes  All other systems were reviewed with the patient and are negative.  PHYSICAL EXAMINATION: ECOG PERFORMANCE STATUS: paraplegic.   Vitals:   09/30/21 1034  BP: 119/71  Pulse: 94  Resp: 18  Temp: (!) 97.2 F (36.2 C)  SpO2: 100%      Filed Weights   09/30/21 1034  Weight: 251 lb (113.9 kg)     GENERAL: well appearing middle aged [place] American male alert, no distress and comfortable SKIN: skin color, texture, turgor are normal, no rashes or significant lesions EYES: conjunctiva are pink and non-injected, sclera clear LUNGS: clear to auscultation and percussion with normal breathing effort HEART: regular rate & rhythm and no murmurs and no lower extremity edema Musculoskeletal: no cyanosis of digits and no clubbing  PSYCH: alert & oriented x 3, fluent speech NEURO: paraplegic, no use of LE bilaterally.   LABORATORY DATA:  I have reviewed the data as listed CBC Latest Ref Rng & Units 09/30/2021 09/02/2021 08/03/2021  WBC 4.0 - 10.5 K/uL 6.3 6.9 8.8  Hemoglobin 13.0 - 17.0 g/dL 12.6(L) 12.3(L) 12.8(L)  Hematocrit 39.0 - 52.0 % 36.6(L) 35.9(L) 35.4(L)  Platelets 150 - 400 K/uL 330 342 370    CMP Latest Ref Rng & Units 09/30/2021 09/02/2021 08/03/2021  Glucose 70 - 99 mg/dL 169(H) 126(H) 140(H)  BUN 8 - 23 mg/dL 12 11 9   Creatinine 0.61 - 1.24 mg/dL 0.81 0.75 0.83  Sodium 135 - 145 mmol/L 142 143 141  Potassium 3.5 - 5.1 mmol/L 3.6 3.8 3.8  Chloride 98 - 111 mmol/L 107 109 105  CO2  22 - 32 mmol/L 29 29 28   Calcium 8.9 - 10.3 mg/dL 9.3 9.3 9.2  Total Protein 6.5 - 8.1 g/dL 7.3 7.1 7.2  Total Bilirubin 0.3 - 1.2 mg/dL 0.5 0.5 0.7  Alkaline Phos 38 - 126 U/L 108 92 84  AST 15 - 41 U/L 18 16 14(L)  ALT 0 - 44 U/L 25 18 15     Lab Results  Component Value Date   MPROTEIN 0.8 (H) 09/02/2021   MPROTEIN 0.6 (H) 04/08/2021   MPROTEIN 0.5 (H) 03/10/2021   Lab Results  Component Value Date   KPAFRELGTCHN 6.0 09/02/2021   KPAFRELGTCHN 7.9 04/08/2021   KPAFRELGTCHN 7.2 03/10/2021   LAMBDASER 61.1 (H) 09/02/2021   LAMBDASER 18.8 04/08/2021   LAMBDASER 18.4 03/10/2021   KAPLAMBRATIO 0.10 (L) 09/02/2021   KAPLAMBRATIO 0.42 04/08/2021   KAPLAMBRATIO 0.39 03/10/2021    RADIOGRAPHIC STUDIES: No results found.  ASSESSMENT & PLAN DEVARIUS NELLES 66 y.o. male with medical history significant for  IgG Lambda Multiple Myeloma who presents for a follow up visit.  After review of the labs, discussion with the patient, and reviewed the imaging his findings are most consistent with a relapsed multiple myeloma.The patient has had excellent success before with treatment of his myeloma with Velcade, Revlimid, and dexamethasone.  Treatment regimen consists of bortezomib 1.59m/m2 subq (Day 1,8, and 15), daratumumab 1800 mg subq (day 1,8 and 15), and dexamethasone 20 mg PO (day 1, 8 and 15). After 3 cycles, daratumumab to be administered q 3 weeks (from cycles 4-8). After cycle 8, continue daratumumab q 4 weeks alone (N Engl J Med 2016; 3242:353-614  # IgG Lambda Multiple Myeloma, Relapsed (ISS Stage II) --findings are most consistent with relapsed multiple myeloma. Patient previously successfully treated with Velcade/Rev/Dex --current plan is Daratumumab/Velcade/Dex  (started 01/09/2020). On 07/02/2020 he transitioned to monthly daratumumab alone. Continue q 4 week dara. --labs today were reviewed and adequate for treatment. SPEP/IFE and sFLC pending today.  --patient will proceed with  Cycle 22 today.  --return for start of Cycle 23 on 10/28/2021  #History of DVT --He had placement of IVC filter, remains on Xarelto. --Due to poor mobility, and lack of bleeding complications, I recommend he remain on Xarelto indefinitely. --caution if Plt count were to drop <50  # Supportive Care -- provided patient with an albuterol inhaler (for use with daratumumab) --acyclovir 4059mBID for VZV prophylaxis --zofran 88m22m8H PRN for nausea/vomiting  --Zometa can be considered after dental clearance, though with his prior episode of osteonecrosis the risk may outweigh the benefits. --patient recieved pretreatment RBC phenotype  No orders of the defined types were placed in this encounter.   All questions were answered. The patient knows to call the clinic with any problems, questions or concerns.  I have spent a total of 30 minutes minutes of face-to-face and non-face-to-face time, preparing to see the patient,  performing a medically appropriate examination, counseling and educating the patient, communicating with other health care professionals, documenting clinical information in the electronic health record,  and care coordination.   IreDede Query-C [facility]: [phone]/06/2022 10:21 AM   Literature Support:  PalLaurene Footmanooka AK, Masszi T, BekWoodland BeachpiArcadia HunMontpelier Munder M, MatPetaluma CenterarDecaturi M, Schecter J, AmiCoronadoin X, Deraedt W, Ahmadi T, Spencer A, Sonneveld P; [employer] Investigators. Daratumumab, Bortezomib, and Dexamethasone for Multiple Myeloma. N EAlta Corningd. 2016 Aug 25;375(8):754-66  --among patients with relapsed or relapsed and refractory multiple myeloma, daratumumab in combination with bortezomib and dexamethasone resulted in significantly longer progression-free survival than bortezomib and dexamethasone alone  and was associated with  infusion-related reactions and higher rates of thrombocytopenia and neutropenia than bortezomib and dexamethasone alone.

## 2021-10-02 ENCOUNTER — Encounter: Payer: Self-pay | Admitting: Hematology and Oncology

## 2021-10-03 LAB — MULTIPLE MYELOMA PANEL, SERUM
Albumin SerPl Elph-Mcnc: 3.3 g/dL (ref 2.9–4.4)
Albumin/Glob SerPl: 1.1 (ref 0.7–1.7)
Alpha 1: 0.2 g/dL (ref 0.0–0.4)
Alpha2 Glob SerPl Elph-Mcnc: 0.9 g/dL (ref 0.4–1.0)
B-Globulin SerPl Elph-Mcnc: 0.8 g/dL (ref 0.7–1.3)
Gamma Glob SerPl Elph-Mcnc: 1.3 g/dL (ref 0.4–1.8)
Globulin, Total: 3.1 g/dL (ref 2.2–3.9)
IgA: 16 mg/dL — ABNORMAL LOW (ref 61–437)
IgG (Immunoglobin G), Serum: 1471 mg/dL (ref 603–1613)
IgM (Immunoglobulin M), Srm: 13 mg/dL — ABNORMAL LOW (ref 20–172)
M Protein SerPl Elph-Mcnc: 1.1 g/dL — ABNORMAL HIGH
Total Protein ELP: 6.4 g/dL (ref 6.0–8.5)

## 2021-10-03 LAB — KAPPA/LAMBDA LIGHT CHAINS
Kappa free light chain: 4.9 mg/L (ref 3.3–19.4)
Kappa, lambda light chain ratio: 0.08 — ABNORMAL LOW (ref 0.26–1.65)
Lambda free light chains: 63.4 mg/L — ABNORMAL HIGH (ref 5.7–26.3)

## 2021-10-28 ENCOUNTER — Encounter: Payer: Self-pay | Admitting: Hematology and Oncology

## 2021-10-28 ENCOUNTER — Inpatient Hospital Stay: Payer: Medicare (Managed Care)

## 2021-10-28 ENCOUNTER — Other Ambulatory Visit: Payer: Self-pay

## 2021-10-28 ENCOUNTER — Inpatient Hospital Stay: Payer: Medicare (Managed Care) | Attending: Physician Assistant

## 2021-10-28 ENCOUNTER — Inpatient Hospital Stay (HOSPITAL_BASED_OUTPATIENT_CLINIC_OR_DEPARTMENT_OTHER): Payer: Medicare (Managed Care) | Admitting: Hematology and Oncology

## 2021-10-28 VITALS — BP 133/88 | HR 79 | Temp 98.4°F

## 2021-10-28 VITALS — Resp 18

## 2021-10-28 DIAGNOSIS — Z7901 Long term (current) use of anticoagulants: Secondary | ICD-10-CM | POA: Diagnosis not present

## 2021-10-28 DIAGNOSIS — C9002 Multiple myeloma in relapse: Secondary | ICD-10-CM

## 2021-10-28 DIAGNOSIS — Z5112 Encounter for antineoplastic immunotherapy: Secondary | ICD-10-CM | POA: Insufficient documentation

## 2021-10-28 DIAGNOSIS — M87180 Osteonecrosis due to drugs, jaw: Secondary | ICD-10-CM

## 2021-10-28 DIAGNOSIS — Z86718 Personal history of other venous thrombosis and embolism: Secondary | ICD-10-CM | POA: Insufficient documentation

## 2021-10-28 DIAGNOSIS — Z8041 Family history of malignant neoplasm of ovary: Secondary | ICD-10-CM | POA: Diagnosis not present

## 2021-10-28 DIAGNOSIS — Z7189 Other specified counseling: Secondary | ICD-10-CM

## 2021-10-28 DIAGNOSIS — E119 Type 2 diabetes mellitus without complications: Secondary | ICD-10-CM | POA: Insufficient documentation

## 2021-10-28 LAB — CMP (CANCER CENTER ONLY)
ALT: 18 U/L (ref 0–44)
AST: 15 U/L (ref 15–41)
Albumin: 4.1 g/dL (ref 3.5–5.0)
Alkaline Phosphatase: 108 U/L (ref 38–126)
Anion gap: 6 (ref 5–15)
BUN: 14 mg/dL (ref 8–23)
CO2: 29 mmol/L (ref 22–32)
Calcium: 9.5 mg/dL (ref 8.9–10.3)
Chloride: 105 mmol/L (ref 98–111)
Creatinine: 0.86 mg/dL (ref 0.61–1.24)
GFR, Estimated: >60 mL/min (ref >60)
Glucose, Bld: 116 mg/dL — ABNORMAL HIGH (ref 70–99)
Potassium: 4 mmol/L (ref 3.5–5.1)
Sodium: 140 mmol/L (ref 135–145)
Total Bilirubin: 0.4 mg/dL (ref 0.3–1.2)
Total Protein: 8 g/dL (ref 6.5–8.1)

## 2021-10-28 LAB — CBC WITH DIFFERENTIAL (CANCER CENTER ONLY)
Abs Immature Granulocytes: 0.02 10*3/uL (ref 0.00–0.07)
Basophils Absolute: 0 10*3/uL (ref 0.0–0.1)
Basophils Relative: 0 %
Eosinophils Absolute: 0.3 10*3/uL (ref 0.0–0.5)
Eosinophils Relative: 4 %
HCT: 37.8 % — ABNORMAL LOW (ref 39.0–52.0)
Hemoglobin: 13.1 g/dL (ref 13.0–17.0)
Immature Granulocytes: 0 %
Lymphocytes Relative: 18 %
Lymphs Abs: 1.4 10*3/uL (ref 0.7–4.0)
MCH: 29 pg (ref 26.0–34.0)
MCHC: 34.7 g/dL (ref 30.0–36.0)
MCV: 83.6 fL (ref 80.0–100.0)
Monocytes Absolute: 0.7 10*3/uL (ref 0.1–1.0)
Monocytes Relative: 9 %
Neutro Abs: 5.6 10*3/uL (ref 1.7–7.7)
Neutrophils Relative %: 69 %
Platelet Count: 297 10*3/uL (ref 150–400)
RBC: 4.52 MIL/uL (ref 4.22–5.81)
RDW: 17.7 % — ABNORMAL HIGH (ref 11.5–15.5)
WBC Count: 8.1 10*3/uL (ref 4.0–10.5)
nRBC: 0 % (ref 0.0–0.2)

## 2021-10-28 LAB — LACTATE DEHYDROGENASE: LDH: 90 U/L — ABNORMAL LOW (ref 98–192)

## 2021-10-28 MED ORDER — DARATUMUMAB-HYALURONIDASE-FIHJ 1800-30000 MG-UT/15ML ~~LOC~~ SOLN
1800.0000 mg | Freq: Once | SUBCUTANEOUS | Status: AC
  Administered 2021-10-28: 1800 mg via SUBCUTANEOUS
  Filled 2021-10-28: qty 15

## 2021-10-28 MED ORDER — DIPHENHYDRAMINE HCL 25 MG PO CAPS
25.0000 mg | ORAL_CAPSULE | Freq: Once | ORAL | Status: AC
  Administered 2021-10-28: 25 mg via ORAL
  Filled 2021-10-28: qty 1

## 2021-10-28 MED ORDER — DEXAMETHASONE 4 MG PO TABS
20.0000 mg | ORAL_TABLET | Freq: Once | ORAL | Status: AC
  Administered 2021-10-28: 20 mg via ORAL

## 2021-10-28 MED ORDER — ACETAMINOPHEN 325 MG PO TABS
650.0000 mg | ORAL_TABLET | Freq: Once | ORAL | Status: AC
  Administered 2021-10-28: 650 mg via ORAL
  Filled 2021-10-28: qty 2

## 2021-10-28 NOTE — Progress Notes (Signed)
?[place] ?Telephone:(336) [phone]   [phone] ? ?PROGRESS NOTE ? ?Patient Care Team: ?[name], MD as PCP - General (Family Medicine) ?[name], MD as Consulting Physician (Physical Medicine and Rehabilitation) ?[name], PA-C as Physician Assistant (Physical Medicine and Rehabilitation) ?Orson Slick, MD as Consulting Physician (Hematology and Oncology) ? ?Hematological/Oncological History ?# IgG Lambda Multiple Myeloma, Relapsed (ISS Stage II) ?1) 06/2010: initial diagnosis of Multiple Myeloma after T8 compression fracture. Treated with Velcade/Revlimid/Dexamethasone and achieved a complete remission ?2) Velcade was discontinued in September 2012 and that Revlimid and Decadron were discontinued in March 2013. 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 ?4) Followed by Dr. Alvy Bimler, last clinic visit 10/09/2019. At that time there was concern for relapse of his multiple myeloma.  ?5) Patient requested transfer to different provider after misunderstanding regarding imaging studies ?6) 12/17/2019: transfer care to Dr. Lorenso Courier  ?7) 01/09/2020: Cycle 1 Day 1 of Dara/Velcade/Dex ?8) 01/21/2020: presented as urgent visit for diarrhea and dehydration. Holding chemotherapy scheduled for 01/23/2020. ?9) 01/30/2020: Resume dara/velcade/dex after resolution of diarrhea.  ?10) 02/13/2020: restaging labs show M protein 0.8, Kappa 4.5, lamba 17.2, ratio 0.26, urine M protein 53 (7.1%). All MM labs indicate improvement.  ?11) 03/10/2020: Cycle 4 Day 1 of Dara/Velcade/Dex. Transition to q 3 week daratumumab.  ?12) 06/04/2020:  Cycle 8 Day 1 of Dara/Velcade/Dex ?13) 06/25/2020:  Cycle 9 Day 1 of Dara/Velcade/Dex ?14) 07/22/2020: Cycle 10 Day 1 of  Dara//Dex ?15) 08/18/2020: Cycle 11 Day 1 of Dara//Dex ?16) 09/23/2020: Cycle 12 Day 1 of Dara//Dex ?17) 10/22/2020: Cycle 13 Day 1 of Dara/Dex   ?18) 11/19/2020: Cycle 14 Day 1 of Dara/Dex  ?19) 12/17/2020: Cycle 15 Day 1 of Dara/Dex  ?20) 01/17/2021: Cycle 16 Day 1 of Dara/Dex  ?21) 02/11/2021: Cycle 17 Day 1 of Dara/Dex ?22) 03/10/2021: Cycle 18 Day 1 of Dara/Dex  ?23) 04/08/2021: Cycle 19 Day 1 of Dara/Dex  ?24) 08/03/2021: Cycle 20 Day 1 of Dara/Dex (delayed due to scheduling error) ?25) 09/02/2021: Cycle 21 Day 1 of Dara/Dex ?26) 09/30/2021: Cycle 22 Day 1 of Dara/Dex ?27) 10/28/2021: Cycle 23 Day 1 of Dara/Dex ? ?Interval History:  ?[name] 66 y.o. male with medical history significant for  IgG Lambda Multiple Myeloma who presents for a follow up visit. The patient's last visit was on 09/30/2021.  He presents today to resume treatment with cycle 23 of Dara/Dex.  ? ?On exam today [name] reports he has been doing well in the interim since her last visit.  He reports that the treatments are going well with no numbness or tingling of his fingers or toes.  His appetite remains good and his energy levels are strong.  He denies any bleeding or bruising.  He is not currently having any infectious symptoms.  He denies any fevers, chills, night sweats, shortness of breath, chest pain or cough. He has no other complaints.A full 10 point ROS is listed below. ? ?MEDICAL HISTORY:  ?Past Medical History:  ?Diagnosis Date  ? Adrenal insufficiency ([place])   ? on chronic dexamethasone  ? Anemia   ? Cancer [facility])   ? Coagulopathy ([place])   ? on xeralto/ s/p DVT while on coumadin,  IVC in place  ? Diabetes mellitus without complication ([place])   ? type 2  ? Gross hematuria 7/14  ? post foley cath procedure  ? History of  blood transfusion 7/14  ? Multiple myeloma   ? thoracic T8 with paraplegia s/p resection- on chemo at visit 10/13/10  ? Multiple myeloma   ? Multiple myeloma without mention of remission   ? Neurogenic bladder   ? Neurogenic bowel   ? Paraplegia ([place])   ? Partial small bowel obstruction ([place]) during dec 2011 admission  ? ? ?SURGICAL HISTORY: ?Past Surgical  History:  ?Procedure Laterality Date  ? COLONOSCOPY WITH PROPOFOL N/A 04/12/2017  ? Procedure: COLONOSCOPY WITH PROPOFOL;  Surgeon: Irene Shipper, MD;  [facility];  Service: Endoscopy;  Laterality: N/A;  ? COLONOSCOPY WITH PROPOFOL N/A 04/19/2017  ? Procedure: COLONOSCOPY WITH PROPOFOL;  Surgeon: Yetta Flock, MD;  [facility];  Service: Gastroenterology;  Laterality: N/A;  ? COLOSTOMY  07/20/2011  ? Procedure: COLOSTOMY;  Surgeon: Judieth Keens, DO;  Location: [place];  Service: General;;  ? COLOSTOMY REVISION  07/20/2011  ? Procedure: COLON RESECTION SIGMOID;  Surgeon: Judieth Keens, DO;  Location: [place];  Service: General;;  ? CYSTOSCOPY N/A 04/04/2013  ? Procedure: CYSTOSCOPY WITH LITHALOPAXY;  Surgeon: Alexis Frock, MD;  [facility];  Service: Urology;  Laterality: N/A;  ? INSERTION OF SUPRAPUBIC CATHETER N/A 04/04/2013  ? Procedure: INSERTION OF SUPRAPUBIC CATHETER;  Surgeon: Alexis Frock, MD;  [facility];  Service: Urology;  Laterality: N/A;  ? LAPAROTOMY  07/20/2011  ? Procedure: EXPLORATORY LAPAROTOMY;  Surgeon: Judieth Keens, DO;  Location: [place];  Service: General;  Laterality: N/A;  ? myeloma thoracic T8 with parpaplegia s/p thoracotomy and thoracic T7-9 cage placement on Dec 26th 2011  07/18/10  ? ? ?SOCIAL HISTORY: ?Social History  ? ?Socioeconomic History  ? Marital status: Married  ?  Spouse name: Not on file  ? Number of children: Not on file  ? Years of education: Not on file  ? Highest education level: Not on file  ?Occupational History  ? Not on file  ?Tobacco Use  ? Smoking status: Never  ? Smokeless tobacco: Never  ?Vaping Use  ? Vaping Use: Never used  ?Substance and Sexual Activity  ? Alcohol use: No  ? Drug use: No  ? Sexual activity: Never  ?Other Topics Concern  ? Not on file  ?Social History Narrative  ? Not on file  ? ?Social Determinants of Health  ? ?Financial Resource Strain: Not on file  ?Food Insecurity: Not on file   ?Transportation Needs: Not on file  ?Physical Activity: Not on file  ?Stress: Not on file  ?Social Connections: Not on file  ?Intimate Partner Violence: Not on file  ? ? ?FAMILY HISTORY: ?Family History  ?Problem Relation Age of Onset  ? Ovarian cancer Mother   ? Diabetes Father   ? ? ?ALLERGIES:  is allergic to feraheme [ferumoxytol]. ? ?MEDICATIONS:  ?Current Outpatient Medications  ?Medication Sig Dispense Refill  ? acyclovir (ZOVIRAX) 400 MG tablet Take 1 tablet (400 mg total) by mouth 2 (two) times daily. 60 tablet 2  ? atorvastatin (LIPITOR) 10 MG tablet Take 10 mg by mouth daily.    ? baclofen (LIORESAL) 20 MG tablet Take 20 mg by mouth 2 (two) times daily.    ? diazepam (DIASTAT ACUDIAL) 10 MG GEL SMARTSIG:By Mouth    ? dorzolamide (TRUSOPT) 2 % ophthalmic solution 1 drop 2 (two) times daily.    ? iron polysaccharides (NU-IRON) 150 MG capsule Take 1 capsule (150 mg total) by mouth daily. 30 capsule 0  ? latanoprost (XALATAN)  0.005 % ophthalmic solution Place 1 drop into both eyes at bedtime.    ? ondansetron (ZOFRAN) 8 MG tablet Take 1 tablet (8 mg total) by mouth every 8 (eight) hours as needed for nausea or vomiting. 30 tablet 0  ? rivaroxaban (XARELTO) 10 MG TABS tablet Take 1 tablet (10 mg total) by mouth daily with supper. 30 tablet 9  ? Skin Protectants, Misc. (EUCERIN) cream Apply 1 application topically 2 (two) times daily as needed for dry skin.     ? zinc oxide (BALMEX) 11.3 % CREA cream Apply 1 application topically 2 (two) times daily.    ? ?No current facility-administered medications for this visit.  ? ?Facility-Administered Medications Ordered in Other Visits  ?Medication Dose Route Frequency Provider Last Rate Last Admin  ? sodium chloride flush (NS) 0.9 % injection 10 mL  10 mL Intravenous PRN [name], MD      ? ? ?REVIEW OF SYSTEMS:   ?Constitutional: ( - ) fevers, ( - )  chills , ( - ) night sweats ?Eyes: ( - ) blurriness of vision, ( - ) double vision, ( - ) watery eyes ?Ears, nose,  mouth, throat, and face: ( - ) mucositis, ( - ) sore throat ?Respiratory: ( - ) cough, ( - ) dyspnea, ( - ) wheezes ?Cardiovascular: ( - ) palpitation, ( - ) chest discomfort, ( - ) lower extremity swelling ?Gastrointesti

## 2021-10-28 NOTE — Patient Instructions (Signed)
[facility]  Discharge Instructions: ?Thank you for choosing [facility] to provide your oncology and hematology care.  ? ?If you have a lab appointment with the [facility], please go directly to the [facility] and check in at the registration area. ?  ?Wear comfortable clothing and clothing appropriate for easy access to any Portacath or PICC line.  ? ?We strive to give you quality time with your provider. You may need to reschedule your appointment if you arrive late (15 or more minutes).  Arriving late affects you and other patients whose appointments are after yours.  Also, if you miss three or more appointments without notifying the office, you may be dismissed from the clinic at the provider?s discretion.    ?  ?For prescription refill requests, have your pharmacy contact our office and allow 72 hours for refills to be completed.   ? ?Today you received the following chemotherapy and/or immunotherapy agents : Darzalex faspro    ?  ?To help prevent nausea and vomiting after your treatment, we encourage you to take your nausea medication as directed. ? ?BELOW ARE SYMPTOMS THAT SHOULD BE REPORTED IMMEDIATELY: ?*FEVER GREATER THAN 100.4 F (38 ?C) OR HIGHER ?*CHILLS OR SWEATING ?*NAUSEA AND VOMITING THAT IS NOT CONTROLLED WITH YOUR NAUSEA MEDICATION ?*UNUSUAL SHORTNESS OF BREATH ?*UNUSUAL BRUISING OR BLEEDING ?*URINARY PROBLEMS (pain or burning when urinating, or frequent urination) ?*BOWEL PROBLEMS (unusual diarrhea, constipation, pain near the anus) ?TENDERNESS IN MOUTH AND THROAT WITH OR WITHOUT PRESENCE OF ULCERS (sore throat, sores in mouth, or a toothache) ?UNUSUAL RASH, SWELLING OR PAIN  ?UNUSUAL VAGINAL DISCHARGE OR ITCHING  ? ?Items with * indicate a potential emergency and should be followed up as soon as possible or go to the Emergency Department if any problems should occur. ? ?Please show the CHEMOTHERAPY ALERT CARD or IMMUNOTHERAPY ALERT CARD at  check-in to the Emergency Department and triage nurse. ? ?Should you have questions after your visit or need to cancel or reschedule your appointment, please contact [facility]  Dept: [phone]  and follow the prompts.  Office hours are 8:00 a.m. to 4:30 p.m. Monday - Friday. Please note that voicemails left after 4:00 p.m. may not be returned until the following business day.  We are closed weekends and major holidays. You have access to a nurse at all times for urgent questions. Please call the main number to the clinic Dept: [phone] and follow the prompts. ? ? ?For any non-urgent questions, you may also contact your provider using MyChart. We now offer e-Visits for anyone 29 and older to request care online for non-urgent symptoms. For details visit [email or web address]. ?  ?Also download the MyChart app! Go to the app store, search "MyChart", open the app, select [facility], and log in with your MyChart username and password. ? ?Due to Covid, a mask is required upon entering the hospital/clinic. If you do not have a mask, one will be given to you upon arrival. For doctor visits, patients may have 1 support person aged 67 or older with them. For treatment visits, patients cannot have anyone with them due to current Covid guidelines and our immunocompromised population.  ? ?

## 2021-10-31 LAB — KAPPA/LAMBDA LIGHT CHAINS
Kappa free light chain: 4.8 mg/L (ref 3.3–19.4)
Kappa, lambda light chain ratio: 0.07 — ABNORMAL LOW (ref 0.26–1.65)
Lambda free light chains: 70 mg/L — ABNORMAL HIGH (ref 5.7–26.3)

## 2021-11-01 LAB — MULTIPLE MYELOMA PANEL, SERUM
Albumin SerPl Elph-Mcnc: 3.3 g/dL (ref 2.9–4.4)
Albumin/Glob SerPl: 1 (ref 0.7–1.7)
Alpha 1: 0.3 g/dL (ref 0.0–0.4)
Alpha2 Glob SerPl Elph-Mcnc: 0.9 g/dL (ref 0.4–1.0)
B-Globulin SerPl Elph-Mcnc: 0.9 g/dL (ref 0.7–1.3)
Gamma Glob SerPl Elph-Mcnc: 1.5 g/dL (ref 0.4–1.8)
Globulin, Total: 3.6 g/dL (ref 2.2–3.9)
IgA: 16 mg/dL — ABNORMAL LOW (ref 61–437)
IgG (Immunoglobin G), Serum: 1679 mg/dL — ABNORMAL HIGH (ref 603–1613)
IgM (Immunoglobulin M), Srm: 9 mg/dL — ABNORMAL LOW (ref 20–172)
M Protein SerPl Elph-Mcnc: 1.3 g/dL — ABNORMAL HIGH
Total Protein ELP: 6.9 g/dL (ref 6.0–8.5)

## 2021-11-25 ENCOUNTER — Other Ambulatory Visit: Payer: Medicare (Managed Care)

## 2021-11-25 ENCOUNTER — Ambulatory Visit: Payer: Medicare (Managed Care)

## 2021-11-25 ENCOUNTER — Ambulatory Visit: Payer: Medicare (Managed Care) | Admitting: Physician Assistant

## 2021-12-02 ENCOUNTER — Other Ambulatory Visit: Payer: Self-pay

## 2021-12-02 ENCOUNTER — Inpatient Hospital Stay: Payer: Medicare (Managed Care) | Attending: Physician Assistant

## 2021-12-02 ENCOUNTER — Inpatient Hospital Stay: Payer: Medicare (Managed Care)

## 2021-12-02 ENCOUNTER — Inpatient Hospital Stay (HOSPITAL_BASED_OUTPATIENT_CLINIC_OR_DEPARTMENT_OTHER): Payer: Medicare (Managed Care) | Admitting: Physician Assistant

## 2021-12-02 ENCOUNTER — Other Ambulatory Visit: Payer: Self-pay | Admitting: Hematology and Oncology

## 2021-12-02 VITALS — BP 117/72 | HR 81 | Temp 98.1°F | Resp 18 | Ht 68.0 in | Wt 248.7 lb

## 2021-12-02 DIAGNOSIS — Z7189 Other specified counseling: Secondary | ICD-10-CM

## 2021-12-02 DIAGNOSIS — C9002 Multiple myeloma in relapse: Secondary | ICD-10-CM

## 2021-12-02 DIAGNOSIS — Z5112 Encounter for antineoplastic immunotherapy: Secondary | ICD-10-CM | POA: Insufficient documentation

## 2021-12-02 LAB — CMP (CANCER CENTER ONLY)
ALT: 22 U/L (ref 0–44)
AST: 17 U/L (ref 15–41)
Albumin: 4 g/dL (ref 3.5–5.0)
Alkaline Phosphatase: 94 U/L (ref 38–126)
Anion gap: 6 (ref 5–15)
BUN: 14 mg/dL (ref 8–23)
CO2: 26 mmol/L (ref 22–32)
Calcium: 9.2 mg/dL (ref 8.9–10.3)
Chloride: 107 mmol/L (ref 98–111)
Creatinine: 0.94 mg/dL (ref 0.61–1.24)
GFR, Estimated: >60 mL/min (ref >60)
Glucose, Bld: 151 mg/dL — ABNORMAL HIGH (ref 70–99)
Potassium: 4.1 mmol/L (ref 3.5–5.1)
Sodium: 139 mmol/L (ref 135–145)
Total Bilirubin: 0.3 mg/dL (ref 0.3–1.2)
Total Protein: 7.7 g/dL (ref 6.5–8.1)

## 2021-12-02 LAB — CBC WITH DIFFERENTIAL (CANCER CENTER ONLY)
Abs Immature Granulocytes: 0.05 10*3/uL (ref 0.00–0.07)
Basophils Absolute: 0 10*3/uL (ref 0.0–0.1)
Basophils Relative: 0 %
Eosinophils Absolute: 0.2 10*3/uL (ref 0.0–0.5)
Eosinophils Relative: 3 %
HCT: 32 % — ABNORMAL LOW (ref 39.0–52.0)
Hemoglobin: 11.5 g/dL — ABNORMAL LOW (ref 13.0–17.0)
Immature Granulocytes: 1 %
Lymphocytes Relative: 16 %
Lymphs Abs: 1.3 10*3/uL (ref 0.7–4.0)
MCH: 30.1 pg (ref 26.0–34.0)
MCHC: 35.9 g/dL (ref 30.0–36.0)
MCV: 83.8 fL (ref 80.0–100.0)
Monocytes Absolute: 0.7 10*3/uL (ref 0.1–1.0)
Monocytes Relative: 8 %
Neutro Abs: 5.9 10*3/uL (ref 1.7–7.7)
Neutrophils Relative %: 72 %
Platelet Count: 311 10*3/uL (ref 150–400)
RBC: 3.82 MIL/uL — ABNORMAL LOW (ref 4.22–5.81)
RDW: 16.7 % — ABNORMAL HIGH (ref 11.5–15.5)
WBC Count: 8.2 10*3/uL (ref 4.0–10.5)
nRBC: 0 % (ref 0.0–0.2)

## 2021-12-02 LAB — LACTATE DEHYDROGENASE: LDH: 88 U/L — ABNORMAL LOW (ref 98–192)

## 2021-12-02 MED ORDER — DEXAMETHASONE 4 MG PO TABS
20.0000 mg | ORAL_TABLET | Freq: Once | ORAL | Status: AC
  Administered 2021-12-02: 20 mg via ORAL
  Filled 2021-12-02: qty 5

## 2021-12-02 MED ORDER — ACETAMINOPHEN 325 MG PO TABS
650.0000 mg | ORAL_TABLET | Freq: Once | ORAL | Status: AC
  Administered 2021-12-02: 650 mg via ORAL
  Filled 2021-12-02: qty 2

## 2021-12-02 MED ORDER — DARATUMUMAB-HYALURONIDASE-FIHJ 1800-30000 MG-UT/15ML ~~LOC~~ SOLN
1800.0000 mg | Freq: Once | SUBCUTANEOUS | Status: AC
  Administered 2021-12-02: 1800 mg via SUBCUTANEOUS
  Filled 2021-12-02: qty 15

## 2021-12-02 MED ORDER — DIPHENHYDRAMINE HCL 25 MG PO CAPS
25.0000 mg | ORAL_CAPSULE | Freq: Once | ORAL | Status: AC
  Administered 2021-12-02: 25 mg via ORAL
  Filled 2021-12-02: qty 1

## 2021-12-04 ENCOUNTER — Encounter: Payer: Self-pay | Admitting: Hematology and Oncology

## 2021-12-04 NOTE — Progress Notes (Signed)
?[facility] ?Telephone:([phone]) [phone]   [phone] ? ?PROGRESS NOTE ? ?Patient Care Team: ?[name], MD as PCP - General (Family Medicine) ?[name], MD as Consulting Physician (Physical Medicine and Rehabilitation) ?[name], PA-C as Physician Assistant (Physical Medicine and Rehabilitation) ?Orson Slick, MD as Consulting Physician (Hematology and Oncology) ? ?Hematological/Oncological History ?# IgG Lambda Multiple Myeloma, Relapsed (ISS Stage II) ?1) 06/2010: initial diagnosis of Multiple Myeloma after T8 compression fracture. Treated with Velcade/Revlimid/Dexamethasone and achieved a complete remission ?2) Velcade was discontinued in September 2012 and that Revlimid and Decadron were discontinued in March 2013. 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 ?4) Followed by Dr. Alvy Bimler, last clinic visit 10/09/2019. At that time there was concern for relapse of his multiple myeloma.  ?5) Patient requested transfer to different provider after misunderstanding regarding imaging studies ?6) 12/17/2019: transfer care to Dr. Lorenso Courier  ?7) 01/09/2020: Cycle 1 Day 1 of Dara/Velcade/Dex ?8) 01/21/2020: presented as urgent visit for diarrhea and dehydration. Holding chemotherapy scheduled for 01/23/2020. ?9) 01/30/2020: Resume dara/velcade/dex after resolution of diarrhea.  ?10) 02/13/2020: restaging labs show M protein 0.8, Kappa 4.5, lamba 17.2, ratio 0.26, urine M protein 53 (7.1%). All MM labs indicate improvement.  ?11) 03/10/2020: Cycle 4 Day 1 of Dara/Velcade/Dex. Transition to q 3 week daratumumab.  ?12) 06/04/2020:  Cycle 8 Day 1 of Dara/Velcade/Dex ?13) 06/25/2020:  Cycle 9 Day 1 of Dara/Velcade/Dex ?14) 07/22/2020: Cycle 10 Day 1 of  Dara//Dex ?15) 08/18/2020: Cycle 11 Day 1 of Dara//Dex ?16) 09/23/2020: Cycle 12 Day 1 of Dara//Dex ?17) 10/22/2020: Cycle 13 Day 1 of Dara/Dex   ?18) 11/19/2020: Cycle 14 Day 1 of Dara/Dex  ?19) 12/17/2020: Cycle 15 Day 1 of Dara/Dex  ?20) 01/17/2021: Cycle 16 Day 1 of Dara/Dex  ?21) 02/11/2021: Cycle 17 Day 1 of Dara/Dex ?22) 03/10/2021: Cycle 18 Day 1 of Dara/Dex  ?23) 04/08/2021: Cycle 19 Day 1 of Dara/Dex  ?24) 08/03/2021: Cycle 20 Day 1 of Dara/Dex (delayed due to scheduling error) ?25) 09/02/2021: Cycle 21 Day 1 of Dara/Dex ?26) 09/30/2021: Cycle 22 Day 1 of Dara/Dex ?27) 10/28/2021: Cycle 23 Day 1 of Dara/Dex ?28) 12/02/2021: Cycle 24 Day 1 of Dara/Dex ? ?Interval History:  ?[name] 66 y.o. male with medical history significant for  IgG Lambda Multiple Myeloma who presents for a follow up visit. The patient's last visit was on 10/28/2021.  He presents today to continue treatment with cycle 24 of Dara/Dex.  ? ?On exam today Mr. Khaimov reports he has been doing well in the interim since her last visit.  He reports that he was recently started on Bactrim therapy due to urinary infection with improvement of symptoms. Otherwise, he is feeling well and tolerating treatment without any issues. His appetite and energy levels are stable. He denies nausea,vomiting or abdominal pain.  He denies any fevers, chills, night sweats, shortness of breath, chest pain or cough. He has no other complaints.A full 10 point ROS is listed below. ? ?MEDICAL HISTORY:  ?Past Medical History:  ?Diagnosis Date  ? Adrenal insufficiency ([place])   ? on chronic dexamethasone  ? Anemia   ? Cancer [facility])   ? Coagulopathy ([place])   ? on xeralto/ s/p DVT while on coumadin,  IVC in place  ? Diabetes mellitus without complication ([place])   ? type 2  ? Gross hematuria 7/14  ? post foley cath  procedure  ? History of blood transfusion 7/14  ? Multiple myeloma   ? thoracic T8 with paraplegia s/p resection- on chemo at visit 10/13/10  ? Multiple myeloma   ? Multiple myeloma without mention of remission   ? Neurogenic bladder   ? Neurogenic bowel   ? Paraplegia ([place])   ? Partial small bowel obstruction ([place])  during dec 2011 admission  ? ? ?SURGICAL HISTORY: ?Past Surgical History:  ?Procedure Laterality Date  ? COLONOSCOPY WITH PROPOFOL N/A 04/12/2017  ? Procedure: COLONOSCOPY WITH PROPOFOL;  Surgeon: Shmiel Morton Shipper, MD;  Location: [facility];  Service: Endoscopy;  Laterality: N/A;  ? COLONOSCOPY WITH PROPOFOL N/A 04/19/2017  ? Procedure: COLONOSCOPY WITH PROPOFOL;  Surgeon: Yetta Flock, MD;  Location: [facility];  Service: Gastroenterology;  Laterality: N/A;  ? COLOSTOMY  07/20/2011  ? Procedure: COLOSTOMY;  Surgeon: Judieth Keens, DO;  Location: [facility];  Service: General;;  ? COLOSTOMY REVISION  07/20/2011  ? Procedure: COLON RESECTION SIGMOID;  Surgeon: Judieth Keens, DO;  Location: [facility];  Service: General;;  ? CYSTOSCOPY N/A 04/04/2013  ? Procedure: CYSTOSCOPY WITH LITHALOPAXY;  Surgeon: Alexis Frock, MD;  Location: [facility];  Service: Urology;  Laterality: N/A;  ? INSERTION OF SUPRAPUBIC CATHETER N/A 04/04/2013  ? Procedure: INSERTION OF SUPRAPUBIC CATHETER;  Surgeon: Alexis Frock, MD;  Location: [facility];  Service: Urology;  Laterality: N/A;  ? LAPAROTOMY  07/20/2011  ? Procedure: EXPLORATORY LAPAROTOMY;  Surgeon: Judieth Keens, DO;  Location: [facility];  Service: General;  Laterality: N/A;  ? myeloma thoracic T8 with parpaplegia s/p thoracotomy and thoracic T7-9 cage placement on Dec 26th 2011  07/18/10  ? ? ?SOCIAL HISTORY: ?Social History  ? ?Socioeconomic History  ? Marital status: Married  ?  Spouse name: Not on file  ? Number of children: Not on file  ? Years of education: Not on file  ? Highest education level: Not on file  ?Occupational History  ? Not on file  ?Tobacco Use  ? Smoking status: Never  ? Smokeless tobacco: Never  ?Vaping Use  ? Vaping Use: Never used  ?Substance and Sexual Activity  ? Alcohol use: No  ? Drug use: No  ? Sexual activity: Never  ?Other Topics Concern  ? Not on file  ?Social History Narrative  ? Not on file  ? ?Social Determinants of Health  ? ?Financial  Resource Strain: Not on file  ?Food Insecurity: Not on file  ?Transportation Needs: Not on file  ?Physical Activity: Not on file  ?Stress: Not on file  ?Social Connections: Not on file  ?Intimate Partner Violence: Not on file  ? ? ?FAMILY HISTORY: ?Family History  ?Problem Relation Age of Onset  ? Ovarian cancer Mother   ? Diabetes Father   ? ? ?ALLERGIES:  is allergic to feraheme [ferumoxytol]. ? ?MEDICATIONS:  ?Current Outpatient Medications  ?Medication Sig Dispense Refill  ? acyclovir (ZOVIRAX) 400 MG tablet Take 1 tablet (400 mg total) by mouth 2 (two) times daily. 60 tablet 2  ? atorvastatin (LIPITOR) 10 MG tablet Take 10 mg by mouth daily.    ? baclofen (LIORESAL) 20 MG tablet Take 20 mg by mouth 2 (two) times daily.    ? diazepam (DIASTAT ACUDIAL) 10 MG GEL SMARTSIG:By Mouth    ? dorzolamide (TRUSOPT) 2 % ophthalmic solution 1 drop 2 (two) times daily.    ? iron polysaccharides (NU-IRON) 150 MG capsule Take 1 capsule (150 mg total) by mouth daily. 30 capsule  0  ? latanoprost (XALATAN) 0.005 % ophthalmic solution Place 1 drop into both eyes at bedtime.    ? ondansetron (ZOFRAN) 8 MG tablet Take 1 tablet (8 mg total) by mouth every 8 (eight) hours as needed for nausea or vomiting. 30 tablet 0  ? rivaroxaban (XARELTO) 10 MG TABS tablet Take 1 tablet (10 mg total) by mouth daily with supper. 30 tablet 9  ? Skin Protectants, Misc. (EUCERIN) cream Apply 1 application topically 2 (two) times daily as needed for dry skin.     ? sulfamethoxazole-trimethoprim (BACTRIM DS) 800-160 MG tablet Take 1 tablet by mouth 2 (two) times daily.    ? zinc oxide (BALMEX) 11.3 % CREA cream Apply 1 application topically 2 (two) times daily.    ? ?No current facility-administered medications for this visit.  ? ?Facility-Administered Medications Ordered in Other Visits  ?Medication Dose Route Frequency Provider Last Rate Last Admin  ? sodium chloride flush (NS) 0.9 % injection 10 mL  10 mL Intravenous PRN [name], MD       ? ? ?REVIEW OF SYSTEMS:   ?Constitutional: ( - ) fevers, ( - )  chills , ( - ) night sweats ?Eyes: ( - ) blurriness of vision, ( - ) double vision, ( - ) watery eyes ?Ears, nose, mouth, throat, and face: ( - ) mucositi

## 2021-12-05 LAB — KAPPA/LAMBDA LIGHT CHAINS
Kappa free light chain: 4.2 mg/L (ref 3.3–19.4)
Kappa, lambda light chain ratio: 0.06 — ABNORMAL LOW (ref 0.26–1.65)
Lambda free light chains: 74.3 mg/L — ABNORMAL HIGH (ref 5.7–26.3)

## 2021-12-07 LAB — MULTIPLE MYELOMA PANEL, SERUM
Albumin SerPl Elph-Mcnc: 3.6 g/dL (ref 2.9–4.4)
Albumin/Glob SerPl: 1.1 (ref 0.7–1.7)
Alpha 1: 0.2 g/dL (ref 0.0–0.4)
Alpha2 Glob SerPl Elph-Mcnc: 0.9 g/dL (ref 0.4–1.0)
B-Globulin SerPl Elph-Mcnc: 0.8 g/dL (ref 0.7–1.3)
Gamma Glob SerPl Elph-Mcnc: 1.4 g/dL (ref 0.4–1.8)
Globulin, Total: 3.3 g/dL (ref 2.2–3.9)
IgA: 8 mg/dL — ABNORMAL LOW (ref 61–437)
IgG (Immunoglobin G), Serum: 1757 mg/dL — ABNORMAL HIGH (ref 603–1613)
IgM (Immunoglobulin M), Srm: 7 mg/dL — ABNORMAL LOW (ref 20–172)
M Protein SerPl Elph-Mcnc: 1.2 g/dL — ABNORMAL HIGH
Total Protein ELP: 6.9 g/dL (ref 6.0–8.5)

## 2021-12-23 ENCOUNTER — Other Ambulatory Visit: Payer: Medicare (Managed Care)

## 2021-12-23 ENCOUNTER — Ambulatory Visit: Payer: Medicare (Managed Care) | Admitting: Hematology and Oncology

## 2021-12-23 ENCOUNTER — Ambulatory Visit: Payer: Medicare (Managed Care)

## 2021-12-30 ENCOUNTER — Other Ambulatory Visit: Payer: Self-pay

## 2021-12-30 ENCOUNTER — Inpatient Hospital Stay: Payer: Medicare (Managed Care)

## 2021-12-30 ENCOUNTER — Inpatient Hospital Stay: Payer: Medicare (Managed Care) | Attending: Physician Assistant

## 2021-12-30 ENCOUNTER — Inpatient Hospital Stay (HOSPITAL_BASED_OUTPATIENT_CLINIC_OR_DEPARTMENT_OTHER): Payer: Medicare (Managed Care) | Admitting: Hematology and Oncology

## 2021-12-30 VITALS — BP 127/77 | HR 85 | Temp 97.5°F | Resp 17 | Ht 68.0 in | Wt 246.9 lb

## 2021-12-30 DIAGNOSIS — C9002 Multiple myeloma in relapse: Secondary | ICD-10-CM | POA: Insufficient documentation

## 2021-12-30 DIAGNOSIS — G8220 Paraplegia, unspecified: Secondary | ICD-10-CM

## 2021-12-30 DIAGNOSIS — Z7901 Long term (current) use of anticoagulants: Secondary | ICD-10-CM | POA: Diagnosis not present

## 2021-12-30 DIAGNOSIS — M87180 Osteonecrosis due to drugs, jaw: Secondary | ICD-10-CM | POA: Diagnosis not present

## 2021-12-30 DIAGNOSIS — Z86718 Personal history of other venous thrombosis and embolism: Secondary | ICD-10-CM | POA: Insufficient documentation

## 2021-12-30 DIAGNOSIS — Z7189 Other specified counseling: Secondary | ICD-10-CM

## 2021-12-30 DIAGNOSIS — Z5111 Encounter for antineoplastic chemotherapy: Secondary | ICD-10-CM | POA: Diagnosis present

## 2021-12-30 DIAGNOSIS — Z95828 Presence of other vascular implants and grafts: Secondary | ICD-10-CM | POA: Diagnosis not present

## 2021-12-30 DIAGNOSIS — Z5112 Encounter for antineoplastic immunotherapy: Secondary | ICD-10-CM | POA: Insufficient documentation

## 2021-12-30 LAB — CBC WITH DIFFERENTIAL (CANCER CENTER ONLY)
Abs Immature Granulocytes: 0.03 10*3/uL (ref 0.00–0.07)
Basophils Absolute: 0 10*3/uL (ref 0.0–0.1)
Basophils Relative: 0 %
Eosinophils Absolute: 0.3 10*3/uL (ref 0.0–0.5)
Eosinophils Relative: 4 %
HCT: 33.5 % — ABNORMAL LOW (ref 39.0–52.0)
Hemoglobin: 11.8 g/dL — ABNORMAL LOW (ref 13.0–17.0)
Immature Granulocytes: 0 %
Lymphocytes Relative: 17 %
Lymphs Abs: 1.2 10*3/uL (ref 0.7–4.0)
MCH: 30.2 pg (ref 26.0–34.0)
MCHC: 35.2 g/dL (ref 30.0–36.0)
MCV: 85.7 fL (ref 80.0–100.0)
Monocytes Absolute: 0.7 10*3/uL (ref 0.1–1.0)
Monocytes Relative: 10 %
Neutro Abs: 5 10*3/uL (ref 1.7–7.7)
Neutrophils Relative %: 69 %
Platelet Count: 299 10*3/uL (ref 150–400)
RBC: 3.91 MIL/uL — ABNORMAL LOW (ref 4.22–5.81)
RDW: 16.7 % — ABNORMAL HIGH (ref 11.5–15.5)
WBC Count: 7.2 10*3/uL (ref 4.0–10.5)
nRBC: 0 % (ref 0.0–0.2)

## 2021-12-30 LAB — CMP (CANCER CENTER ONLY)
ALT: 19 U/L (ref 0–44)
AST: 17 U/L (ref 15–41)
Albumin: 3.9 g/dL (ref 3.5–5.0)
Alkaline Phosphatase: 97 U/L (ref 38–126)
Anion gap: 5 (ref 5–15)
BUN: 12 mg/dL (ref 8–23)
CO2: 28 mmol/L (ref 22–32)
Calcium: 9.7 mg/dL (ref 8.9–10.3)
Chloride: 107 mmol/L (ref 98–111)
Creatinine: 0.83 mg/dL (ref 0.61–1.24)
GFR, Estimated: >60 mL/min (ref >60)
Glucose, Bld: 138 mg/dL — ABNORMAL HIGH (ref 70–99)
Potassium: 3.8 mmol/L (ref 3.5–5.1)
Sodium: 140 mmol/L (ref 135–145)
Total Bilirubin: 0.4 mg/dL (ref 0.3–1.2)
Total Protein: 7.6 g/dL (ref 6.5–8.1)

## 2021-12-30 LAB — LACTATE DEHYDROGENASE: LDH: 86 U/L — ABNORMAL LOW (ref 98–192)

## 2021-12-30 MED ORDER — ACETAMINOPHEN 325 MG PO TABS
650.0000 mg | ORAL_TABLET | Freq: Once | ORAL | Status: AC
  Administered 2021-12-30: 650 mg via ORAL
  Filled 2021-12-30: qty 2

## 2021-12-30 MED ORDER — DEXAMETHASONE 4 MG PO TABS
20.0000 mg | ORAL_TABLET | Freq: Once | ORAL | Status: AC
  Administered 2021-12-30: 20 mg via ORAL
  Filled 2021-12-30: qty 5

## 2021-12-30 MED ORDER — DIPHENHYDRAMINE HCL 25 MG PO CAPS
25.0000 mg | ORAL_CAPSULE | Freq: Once | ORAL | Status: AC
  Administered 2021-12-30: 25 mg via ORAL
  Filled 2021-12-30: qty 1

## 2021-12-30 MED ORDER — DARATUMUMAB-HYALURONIDASE-FIHJ 1800-30000 MG-UT/15ML ~~LOC~~ SOLN
1800.0000 mg | Freq: Once | SUBCUTANEOUS | Status: AC
  Administered 2021-12-30: 1800 mg via SUBCUTANEOUS
  Filled 2021-12-30: qty 15

## 2021-12-30 NOTE — Patient Instructions (Signed)
[facility]  Discharge Instructions: Thank you for choosing [facility] to provide your oncology and hematology care.   If you have a lab appointment with the [facility], please go directly to the [facility] and check in at the registration area.   Wear comfortable clothing and clothing appropriate for easy access to any Portacath or PICC line.   We strive to give you quality time with your provider. You may need to reschedule your appointment if you arrive late (15 or more minutes).  Arriving late affects you and other patients whose appointments are after yours.  Also, if you miss three or more appointments without notifying the office, you may be dismissed from the clinic at the provider's discretion.      For prescription refill requests, have your pharmacy contact our office and allow 72 hours for refills to be completed.    Today you received the following chemotherapy and/or immunotherapy agent: Darzalex      To help prevent nausea and vomiting after your treatment, we encourage you to take your nausea medication as directed.  BELOW ARE SYMPTOMS THAT SHOULD BE REPORTED IMMEDIATELY: *FEVER GREATER THAN 100.4 F (38 C) OR HIGHER *CHILLS OR SWEATING *NAUSEA AND VOMITING THAT IS NOT CONTROLLED WITH YOUR NAUSEA MEDICATION *UNUSUAL SHORTNESS OF BREATH *UNUSUAL BRUISING OR BLEEDING *URINARY PROBLEMS (pain or burning when urinating, or frequent urination) *BOWEL PROBLEMS (unusual diarrhea, constipation, pain near the anus) TENDERNESS IN MOUTH AND THROAT WITH OR WITHOUT PRESENCE OF ULCERS (sore throat, sores in mouth, or a toothache) UNUSUAL RASH, SWELLING OR PAIN  UNUSUAL VAGINAL DISCHARGE OR ITCHING   Items with * indicate a potential emergency and should be followed up as soon as possible or go to the Emergency Department if any problems should occur.  Please show the CHEMOTHERAPY ALERT CARD or IMMUNOTHERAPY ALERT CARD at check-in to  the Emergency Department and triage nurse.  Should you have questions after your visit or need to cancel or reschedule your appointment, please contact [facility]  Dept: [phone]  and follow the prompts.  Office hours are 8:00 a.m. to 4:30 p.m. Monday - Friday. Please note that voicemails left after 4:00 p.m. may not be returned until the following business day.  We are closed weekends and major holidays. You have access to a nurse at all times for urgent questions. Please call the main number to the clinic Dept: [phone] and follow the prompts.   For any non-urgent questions, you may also contact your provider using MyChart. We now offer e-Visits for anyone 68 and older to request care online for non-urgent symptoms. For details visit [email or web address].   Also download the MyChart app! Go to the app store, search "MyChart", open the app, select [facility], and log in with your MyChart username and password.  Due to Covid, a mask is required upon entering the hospital/clinic. If you do not have a mask, one will be given to you upon arrival. For doctor visits, patients may have 1 support person aged 40 or older with them. For treatment visits, patients cannot have anyone with them due to current Covid guidelines and our immunocompromised population.

## 2021-12-30 NOTE — Progress Notes (Signed)
[place] Telephone:([phone]) [phone]   Fax:(336) [phone]  PROGRESS NOTE  Patient Care Team: [name], MD as PCP - General (Family Medicine) [name], MD as Consulting Physician (Physical Medicine and Rehabilitation) Donia Guiles Lavon Paganini, PA-C as Physician Assistant (Physical Medicine and Rehabilitation) Orson Slick, MD as Consulting Physician (Hematology and Oncology)  Hematological/Oncological History # IgG Lambda Multiple Myeloma, Relapsed (ISS Stage II) 1) 06/2010: initial diagnosis of Multiple Myeloma after T8 compression fracture. Treated with Velcade/Revlimid/Dexamethasone and achieved a complete remission 2) Velcade was discontinued in September 2012 and that Revlimid and Decadron were discontinued in March 2013. 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 4) Followed by Dr. Alvy Bimler, last clinic visit 10/09/2019. At that time there was concern for relapse of his multiple myeloma.  5) Patient requested transfer to different provider after misunderstanding regarding imaging studies 6) 12/17/2019: transfer care to Dr. Lorenso Courier  7) 01/09/2020: Cycle 1 Day 1 of Dara/Velcade/Dex 8) 01/21/2020: presented as urgent visit for diarrhea and dehydration. Holding chemotherapy scheduled for 01/23/2020. 9) 01/30/2020: Resume dara/velcade/dex after resolution of diarrhea.  10) 02/13/2020: restaging labs show M protein 0.8, Kappa 4.5, lamba 17.2, ratio 0.26, urine M protein 53 (7.1%). All MM labs indicate improvement.  11) 03/10/2020: Cycle 4 Day 1 of Dara/Velcade/Dex. Transition to q 3 week daratumumab.  12) 06/04/2020:  Cycle 8 Day 1 of Dara/Velcade/Dex 13) 06/25/2020:  Cycle 9 Day 1 of Dara/Velcade/Dex 14) 07/22/2020: Cycle 10 Day 1 of  Dara//Dex 15) 08/18/2020: Cycle 11 Day 1 of Dara//Dex 16) 09/23/2020: Cycle 12 Day 1 of Dara//Dex 17) 10/22/2020: Cycle 13 Day 1 of Dara/Dex   18) 11/19/2020: Cycle 14 Day 1 of Dara/Dex  19) 12/17/2020: Cycle 15 Day 1 of Dara/Dex  20) 01/17/2021: Cycle 16 Day 1 of Dara/Dex  21) 02/11/2021: Cycle 17 Day 1 of Dara/Dex 22) 03/10/2021: Cycle 18 Day 1 of Dara/Dex  23) 04/08/2021: Cycle 19 Day 1 of Dara/Dex  24) 08/03/2021: Cycle 20 Day 1 of Dara/Dex (delayed due to scheduling error) 25) 09/02/2021: Cycle 21 Day 1 of Dara/Dex 26) 09/30/2021: Cycle 22 Day 1 of Dara/Dex 27) 10/28/2021: Cycle 23 Day 1 of Dara/Dex 28) 12/02/2021: Cycle 24 Day 1 of Dara/Dex 29) 12/30/2021: Cycle 25 Day 1 of Dara/Dex  Interval History:  [name] 66 y.o. male with medical history significant for  IgG Lambda Multiple Myeloma who presents for a follow up visit. The patient's last visit was on 12/02/2021.  He presents today to continue treatment with cycle 25 of Dara/Dex.   On exam today [name] reports he has been well in the interim since our last visit.  He reports that the daratumumab restarted has been "so far so good".  He notes that his strength has been good and his energy has been strong.  He reports his appetite has been good.  When asked about his wife he says that they are "still married".  Overall he has had no major changes in his health and he feels quite well.  He denies any new bone or back pain.  He denies nausea,vomiting or abdominal pain.  He denies any fevers, chills, night sweats, shortness of breath, chest pain or cough. He has no other complaints.A full 10 point ROS is listed below.  MEDICAL HISTORY:  Past Medical History:  Diagnosis Date   Adrenal insufficiency ([place])    on chronic dexamethasone   Anemia  Cancer ([place])    Coagulopathy ([place])    on xeralto/ s/p DVT while on coumadin,  IVC in place   Diabetes mellitus without complication ([place])    type 2   Gross hematuria 7/14   post foley cath procedure   History of blood transfusion 7/14   Multiple myeloma    thoracic T8 with paraplegia s/p resection- on chemo at visit 10/13/10    Multiple myeloma    Multiple myeloma without mention of remission    Neurogenic bladder    Neurogenic bowel    Paraplegia ([place])    Partial small bowel obstruction ([place]) during dec 2011 admission    SURGICAL HISTORY: Past Surgical History:  Procedure Laterality Date   COLONOSCOPY WITH PROPOFOL N/A 04/12/2017   Procedure: COLONOSCOPY WITH PROPOFOL;  Surgeon: Irene Shipper, MD;  Location: [facility];  Service: Endoscopy;  Laterality: N/A;   COLONOSCOPY WITH PROPOFOL N/A 04/19/2017   Procedure: COLONOSCOPY WITH PROPOFOL;  Surgeon: Yetta Flock, MD;  Location: [facility];  Service: Gastroenterology;  Laterality: N/A;   COLOSTOMY  07/20/2011   Procedure: COLOSTOMY;  Surgeon: Judieth Keens, DO;  Location: [place];  Service: General;;   COLOSTOMY REVISION  07/20/2011   Procedure: COLON RESECTION SIGMOID;  Surgeon: Judieth Keens, DO;  Location: [place];  Service: General;;   CYSTOSCOPY N/A 04/04/2013   Procedure: CYSTOSCOPY WITH LITHALOPAXY;  Surgeon: Alexis Frock, MD;  Location: [facility];  Service: Urology;  Laterality: N/A;   INSERTION OF SUPRAPUBIC CATHETER N/A 04/04/2013   Procedure: INSERTION OF SUPRAPUBIC CATHETER;  Surgeon: Alexis Frock, MD;  Location: [facility];  Service: Urology;  Laterality: N/A;   LAPAROTOMY  07/20/2011   Procedure: EXPLORATORY LAPAROTOMY;  Surgeon: Judieth Keens, DO;  Location: [place];  Service: General;  Laterality: N/A;   myeloma thoracic T8 with parpaplegia s/p thoracotomy and thoracic T7-9 cage placement on Dec 26th 2011  07/18/10    SOCIAL HISTORY: Social History   Socioeconomic History   Marital status: Married    Spouse name: Not on file   Number of children: Not on file   Years of education: Not on file   Highest education level: Not on file  Occupational History   Not on file  Tobacco Use   Smoking status: Never   Smokeless tobacco: Never  Vaping Use   Vaping Use: Never used  Substance and Sexual Activity   Alcohol use: No    Drug use: No   Sexual activity: Never  Other Topics Concern   Not on file  Social History Narrative   Not on file   Social Determinants of Health   Financial Resource Strain: Not on file  Food Insecurity: Not on file  Transportation Needs: Not on file  Physical Activity: Not on file  Stress: Not on file  Social Connections: Not on file  Intimate Partner Violence: Not on file    FAMILY HISTORY: Family History  Problem Relation Age of Onset   Ovarian cancer Mother    Diabetes Father     ALLERGIES:  is allergic to feraheme [ferumoxytol].  MEDICATIONS:  Current Outpatient Medications  Medication Sig Dispense Refill   acyclovir (ZOVIRAX) 400 MG tablet Take 1 tablet (400 mg total) by mouth 2 (two) times daily. 60 tablet 2   atorvastatin (LIPITOR) 10 MG tablet Take 10 mg by mouth daily.     baclofen (LIORESAL) 20 MG tablet Take 20 mg by mouth 2 (two) times daily.     diazepam (DIASTAT  ACUDIAL) 10 MG GEL SMARTSIG:By Mouth     dorzolamide (TRUSOPT) 2 % ophthalmic solution 1 drop 2 (two) times daily.     iron polysaccharides (NU-IRON) 150 MG capsule Take 1 capsule (150 mg total) by mouth daily. 30 capsule 0   latanoprost (XALATAN) 0.005 % ophthalmic solution Place 1 drop into both eyes at bedtime.     ondansetron (ZOFRAN) 8 MG tablet Take 1 tablet (8 mg total) by mouth every 8 (eight) hours as needed for nausea or vomiting. 30 tablet 0   rivaroxaban (XARELTO) 10 MG TABS tablet Take 1 tablet (10 mg total) by mouth daily with supper. 30 tablet 9   Skin Protectants, Misc. (EUCERIN) cream Apply 1 application topically 2 (two) times daily as needed for dry skin.      sulfamethoxazole-trimethoprim (BACTRIM DS) 800-160 MG tablet Take 1 tablet by mouth 2 (two) times daily.     zinc oxide (BALMEX) 11.3 % CREA cream Apply 1 application topically 2 (two) times daily.     No current facility-administered medications for this visit.   Facility-Administered Medications Ordered in Other Visits   Medication Dose Route Frequency Provider Last Rate Last Admin   sodium chloride flush (NS) 0.9 % injection 10 mL  10 mL Intravenous PRN Alvy Bimler, Ni, MD        REVIEW OF SYSTEMS:   Constitutional: ( - ) fevers, ( - )  chills , ( - ) night sweats Eyes: ( - ) blurriness of vision, ( - ) double vision, ( - ) watery eyes Ears, nose, mouth, throat, and face: ( - ) mucositis, ( - ) sore throat Respiratory: ( - ) cough, ( - ) dyspnea, ( - ) wheezes Cardiovascular: ( - ) palpitation, ( - ) chest discomfort, ( - ) lower extremity swelling Gastrointestinal:  ( - ) nausea, ( - ) heartburn, ( - ) change in bowel habits Skin: ( - ) abnormal skin rashes Lymphatics: ( - ) new lymphadenopathy, ( - ) easy bruising Neurological: ( - ) numbness, ( - ) tingling, ( - ) new weaknesses Behavioral/Psych: ( - ) mood change, ( - ) new changes  All other systems were reviewed with the patient and are negative.  PHYSICAL EXAMINATION: ECOG PERFORMANCE STATUS: paraplegic.   Vitals:   12/30/21 1132  BP: 127/77  Pulse: 85  Resp: 17  Temp: (!) 97.5 F (36.4 C)  SpO2: 99%      Filed Weights   12/30/21 1132  Weight: 246 lb 14.4 oz (112 kg)   GENERAL: well appearing middle aged [place] American male alert, no distress and comfortable SKIN: skin color, texture, turgor are normal, no rashes or significant lesions EYES: conjunctiva are pink and non-injected, sclera clear LUNGS: clear to auscultation and percussion with normal breathing effort HEART: regular rate & rhythm and no murmurs and no lower extremity edema Musculoskeletal: no cyanosis of digits and no clubbing  PSYCH: alert & oriented x 3, fluent speech NEURO: paraplegic, no use of LE bilaterally.   LABORATORY DATA:  I have reviewed the data as listed    Latest Ref Rng & Units 12/30/2021   10:49 AM 12/02/2021   10:00 AM 10/28/2021   11:11 AM  CBC  WBC 4.0 - 10.5 K/uL 7.2  8.2  8.1   Hemoglobin 13.0 - 17.0 g/dL 11.8  11.5  13.1   Hematocrit 39.0 -  52.0 % 33.5  32.0  37.8   Platelets 150 - 400 K/uL 299  311  297  Latest Ref Rng & Units 12/30/2021   10:49 AM 12/02/2021   10:00 AM 10/28/2021   11:11 AM  CMP  Glucose 70 - 99 mg/dL 138  151  116   BUN 8 - 23 mg/dL 12  14  14    Creatinine 0.61 - 1.24 mg/dL 0.83  0.94  0.86   Sodium 135 - 145 mmol/L 140  139  140   Potassium 3.5 - 5.1 mmol/L 3.8  4.1  4.0   Chloride 98 - 111 mmol/L 107  107  105   CO2 22 - 32 mmol/L 28  26  29    Calcium 8.9 - 10.3 mg/dL 9.7  9.2  9.5   Total Protein 6.5 - 8.1 g/dL 7.6  7.7  8.0   Total Bilirubin 0.3 - 1.2 mg/dL 0.4  0.3  0.4   Alkaline Phos 38 - 126 U/L 97  94  108   AST 15 - 41 U/L 17  17  15    ALT 0 - 44 U/L 19  22  18      Lab Results  Component Value Date   MPROTEIN 1.4 (H) 12/30/2021   MPROTEIN 1.2 (H) 12/02/2021   MPROTEIN 1.3 (H) 10/28/2021   Lab Results  Component Value Date   KPAFRELGTCHN 4.9 12/30/2021   KPAFRELGTCHN 4.2 12/02/2021   KPAFRELGTCHN 4.8 10/28/2021   LAMBDASER 81.6 (H) 12/30/2021   LAMBDASER 74.3 (H) 12/02/2021   LAMBDASER 70.0 (H) 10/28/2021   KAPLAMBRATIO 0.06 (L) 12/30/2021   KAPLAMBRATIO 0.06 (L) 12/02/2021   KAPLAMBRATIO 0.07 (L) 10/28/2021    RADIOGRAPHIC STUDIES: No results found.  ASSESSMENT & PLAN ZAMIER EGGEBRECHT 66 y.o. male with medical history significant for  IgG Lambda Multiple Myeloma who presents for a follow up visit.  After review of the labs, discussion with the patient, and reviewed the imaging his findings are most consistent with a relapsed multiple myeloma.The patient has had excellent success before with treatment of his myeloma with Velcade, Revlimid, and dexamethasone.  Treatment regimen consists of bortezomib 1.45m/m2 subq (Day 1,8, and 15), daratumumab 1800 mg subq (day 1,8 and 15), and dexamethasone 20 mg PO (day 1, 8 and 15). After 3 cycles, daratumumab to be administered q 3 weeks (from cycles 4-8). After cycle 8, continue daratumumab q 4 weeks alone (N Engl J Med 2016;  [id number]  # IgG Lambda Multiple Myeloma, Relapsed (ISS Stage II) --findings are most consistent with relapsed multiple myeloma. Patient previously successfully treated with Velcade/Rev/Dex --current plan is Daratumumab/Velcade/Dex  (started 01/09/2020). On 07/02/2020 he transitioned to monthly daratumumab alone. Continue q 4 week dara. --Most recent SPEP/IFE and sFLC showed slight increase in M protein measuring 1.4. K 4.9, L 81.6.0 and Ratio 0.06. --labs today were reviewed and adequate for treatment. SPEP/IFE and sFLC pending today.  --patient will proceed with Cycle 25 today.  --return to transition to Kyprolis in 2 weeks.   #History of DVT --He had placement of IVC filter, remains on Xarelto. --Due to poor mobility, and lack of bleeding complications, I recommend he remain on Xarelto indefinitely. --caution if Plt count were to drop <50  # Supportive Care -- provided patient with an albuterol inhaler (for use with daratumumab) --acyclovir [id number] for VZV prophylaxis --zofran [id number] PRN for nausea/vomiting  --Zometa can be considered after dental clearance, though with his prior episode of osteonecrosis the risk may outweigh the benefits. --patient recieved pretreatment RBC phenotype  Orders Placed This Encounter  Procedures   DG Bone Survey Met  Standing Status:   Future    Standing Expiration Date:   12/31/2022    Order Specific Question:   Reason for Exam (SYMPTOM  OR DIAGNOSIS REQUIRED)    Answer:   assess for worsening lytic lesions    Order Specific Question:   Preferred imaging location?    Answer:   [facility], [place]   24-Hr Ur UPEP/UIFE/Light Chains/TP    Standing Status:   Future    Standing Expiration Date:   12/30/2022    All questions were answered. The patient knows to call the clinic with any problems, questions or concerns.  I have spent a total of 30 minutes minutes of face-to-face and non-face-to-face time, preparing to see the patient,  performing a  medically appropriate examination, counseling and educating the patient, communicating with other health care professionals, documenting clinical information in the electronic health record,  and care coordination.   Ledell Peoples, MD Department of Hematology/Oncology [place] [facility] Phone: [phone] Pager: [phone] Email: [email or web address]   01/03/2022 5:08 PM   Literature Support:  Archie Endo, Weisel Raliegh Ip, [place], Masszi T, [place], [place] I, [place] V, Munder M, [place], Mark TM, Qi M, Schect[place] J, [place], Qin X, Deraedt W, Ahmadi T, Spencer A, Sonneveld P; [employer] Investigators. Daratumumab, Bortezomib, and Dexamethasone for Multiple Myeloma. [place] Med. 2016 Aug 25;375(8):754-66  --among patients with relapsed or relapsed and refractory multiple myeloma, daratumumab in combination with bortezomib and dexamethasone resulted in significantly longer progression-free survival than bortezomib and dexamethasone alone and was associated with infusion-related reactions and higher rates of thrombocytopenia and neutropenia than bortezomib and dexamethasone alone.

## 2022-01-02 LAB — KAPPA/LAMBDA LIGHT CHAINS
Kappa free light chain: 4.9 mg/L (ref 3.3–19.4)
Kappa, lambda light chain ratio: 0.06 — ABNORMAL LOW (ref 0.26–1.65)
Lambda free light chains: 81.6 mg/L — ABNORMAL HIGH (ref 5.7–26.3)

## 2022-01-03 ENCOUNTER — Encounter: Payer: Self-pay | Admitting: Hematology and Oncology

## 2022-01-03 LAB — MULTIPLE MYELOMA PANEL, SERUM
Albumin SerPl Elph-Mcnc: 3.5 g/dL (ref 2.9–4.4)
Albumin/Glob SerPl: 1 (ref 0.7–1.7)
Alpha 1: 0.3 g/dL (ref 0.0–0.4)
Alpha2 Glob SerPl Elph-Mcnc: 0.9 g/dL (ref 0.4–1.0)
B-Globulin SerPl Elph-Mcnc: 0.9 g/dL (ref 0.7–1.3)
Gamma Glob SerPl Elph-Mcnc: 1.6 g/dL (ref 0.4–1.8)
Globulin, Total: 3.7 g/dL (ref 2.2–3.9)
IgA: 12 mg/dL — ABNORMAL LOW (ref 61–437)
IgG (Immunoglobin G), Serum: 1985 mg/dL — ABNORMAL HIGH (ref 603–1613)
IgM (Immunoglobulin M), Srm: 6 mg/dL — ABNORMAL LOW (ref 20–172)
M Protein SerPl Elph-Mcnc: 1.4 g/dL — ABNORMAL HIGH
Total Protein ELP: 7.2 g/dL (ref 6.0–8.5)

## 2022-01-03 NOTE — Progress Notes (Signed)
DISCONTINUE ON PATHWAY REGIMEN - Multiple Myeloma and Other Plasma Cell Dyscrasias     Cycles 1 through 3: A cycle is every 21 days:     Dexamethasone      Bortezomib      Daratumumab and hyaluronidase-fihj    Cycles 4 through 8: A cycle is every 21 days:     Dexamethasone      Bortezomib      Daratumumab and hyaluronidase-fihj    Cycles 9 and beyond: A cycle is every 28 days:     Daratumumab and hyaluronidase-fihj   **Always confirm dose/schedule in your pharmacy ordering system**  REASON: Disease Progression PRIOR TREATMENT: [id number]: DaraVd - Subcutaneous Daratumumab (Daratumumab/hyaluronidase SUBQ + Bortezomib 1.3 mg/m2 SUBQ D1, 4, 8, 11 + Dexamethasone 20 mg) Until Progression or  Unacceptable Toxicity TREATMENT RESPONSE: Partial Response (PR)  START ON PATHWAY REGIMEN - Multiple Myeloma and Other Plasma Cell Dyscrasias     A cycle is every 28 days:     Carfilzomib      Carfilzomib      Carfilzomib      Dexamethasone      Dexamethasone   **Always confirm dose/schedule in your pharmacy ordering system**  Patient Characteristics: Multiple Myeloma, Relapsed / Refractory, Second through Fourth Lines of Therapy, Fit or Candidate for Triplet Therapy, Lenalidomide-Refractory or Lenalidomide-based Regimen Not Preferred, Not a Candidate for Anti-CD38 Antibody Disease Classification: Multiple Myeloma R-ISS Staging: II Therapeutic Status: Relapsed Line of Therapy: Third Line Anti-CD38 Antibody Candidacy: Not a Candidate for Anti-CD38 Antibody Lenalidomide-based Regimen Preference/Candidacy: Lenalidomide-based Regimen Not Preferred Intent of Therapy: Non-Curative / Palliative Intent, Discussed with Patient

## 2022-01-04 ENCOUNTER — Telehealth: Payer: Self-pay | Admitting: Hematology and Oncology

## 2022-01-04 NOTE — Telephone Encounter (Signed)
.  Called patient to schedule appointment per 6/13 inbasket, patient is aware of date and time.  [facility] also aware

## 2022-01-06 ENCOUNTER — Inpatient Hospital Stay (HOSPITAL_BASED_OUTPATIENT_CLINIC_OR_DEPARTMENT_OTHER): Payer: Medicare (Managed Care) | Admitting: Hematology and Oncology

## 2022-01-06 ENCOUNTER — Inpatient Hospital Stay: Payer: Medicare (Managed Care)

## 2022-01-06 ENCOUNTER — Other Ambulatory Visit: Payer: Self-pay

## 2022-01-06 VITALS — BP 109/72 | HR 79 | Temp 97.2°F | Resp 16

## 2022-01-06 DIAGNOSIS — G8220 Paraplegia, unspecified: Secondary | ICD-10-CM | POA: Diagnosis not present

## 2022-01-06 DIAGNOSIS — Z7189 Other specified counseling: Secondary | ICD-10-CM

## 2022-01-06 DIAGNOSIS — Z7901 Long term (current) use of anticoagulants: Secondary | ICD-10-CM

## 2022-01-06 DIAGNOSIS — C9002 Multiple myeloma in relapse: Secondary | ICD-10-CM

## 2022-01-06 DIAGNOSIS — Z95828 Presence of other vascular implants and grafts: Secondary | ICD-10-CM

## 2022-01-06 DIAGNOSIS — Z5111 Encounter for antineoplastic chemotherapy: Secondary | ICD-10-CM | POA: Diagnosis not present

## 2022-01-06 MED ORDER — SODIUM CHLORIDE 0.9 % IV SOLN: Freq: Once | INTRAVENOUS | Status: AC

## 2022-01-06 MED ORDER — SODIUM CHLORIDE 0.9 % IV SOLN
40.0000 mg | Freq: Once | INTRAVENOUS | Status: AC
  Administered 2022-01-06: 40 mg via INTRAVENOUS
  Filled 2022-01-06: qty 4

## 2022-01-06 MED ORDER — DEXTROSE 5 % IV SOLN
40.0000 mg | Freq: Once | INTRAVENOUS | Status: AC
  Administered 2022-01-06: 40 mg via INTRAVENOUS
  Filled 2022-01-06: qty 15

## 2022-01-06 NOTE — Patient Instructions (Signed)
[facility] ONCOLOGY  Discharge Instructions: Thank you for choosing [facility] to provide your oncology and hematology care.   If you have a lab appointment with the [facility], please go directly to the [facility] and check in at the registration area.   Wear comfortable clothing and clothing appropriate for easy access to any Portacath or PICC line.   We strive to give you quality time with your provider. You may need to reschedule your appointment if you arrive late (15 or more minutes).  Arriving late affects you and other patients whose appointments are after yours.  Also, if you miss three or more appointments without notifying the office, you may be dismissed from the clinic at the provider's discretion.      For prescription refill requests, have your pharmacy contact our office and allow 72 hours for refills to be completed.    Today you received the following chemotherapy and/or immunotherapy agents Kyprolis      To help prevent nausea and vomiting after your treatment, we encourage you to take your nausea medication as directed.  BELOW ARE SYMPTOMS THAT SHOULD BE REPORTED IMMEDIATELY: *FEVER GREATER THAN 100.4 F (38 C) OR HIGHER *CHILLS OR SWEATING *NAUSEA AND VOMITING THAT IS NOT CONTROLLED WITH YOUR NAUSEA MEDICATION *UNUSUAL SHORTNESS OF BREATH *UNUSUAL BRUISING OR BLEEDING *URINARY PROBLEMS (pain or burning when urinating, or frequent urination) *BOWEL PROBLEMS (unusual diarrhea, constipation, pain near the anus) TENDERNESS IN MOUTH AND THROAT WITH OR WITHOUT PRESENCE OF ULCERS (sore throat, sores in mouth, or a toothache) UNUSUAL RASH, SWELLING OR PAIN  UNUSUAL VAGINAL DISCHARGE OR ITCHING   Items with * indicate a potential emergency and should be followed up as soon as possible or go to the Emergency Department if any problems should occur.  Please show the CHEMOTHERAPY ALERT CARD or IMMUNOTHERAPY ALERT CARD at check-in to  the Emergency Department and triage nurse.  Should you have questions after your visit or need to cancel or reschedule your appointment, please contact [facility]  Dept: [phone]  and follow the prompts.  Office hours are 8:00 a.m. to 4:30 p.m. Monday - Friday. Please note that voicemails left after 4:00 p.m. may not be returned until the following business day.  We are closed weekends and major holidays. You have access to a nurse at all times for urgent questions. Please call the main number to the clinic Dept: [phone] and follow the prompts.   For any non-urgent questions, you may also contact your provider using MyChart. We now offer e-Visits for anyone 99 and older to request care online for non-urgent symptoms. For details visit [email or web address].   Also download the MyChart app! Go to the app store, search "MyChart", open the app, select [facility], and log in with your MyChart username and password.  Masks are optional in the cancer centers. If you would like for your care team to wear a mask while they are taking care of you, please let them know. For doctor visits, patients may have with them one support person who is at least 68 years old. At this time, visitors are not allowed in the infusion area.  Carfilzomib injection What is this medication? CARFILZOMIB (kar FILZ oh mib) targets a specific protein within cancer cells and stops the cancer cells from growing. It is used to treat multiple myeloma. This medicine may be used for other purposes; ask your health care provider or pharmacist if you have  questions. COMMON BRAND NAME(S): KYPROLIS What should I tell my care team before I take this medication? They need to know if you have any of these conditions: heart disease history of blood clots irregular heartbeat kidney disease liver disease lung or breathing disease an unusual or allergic reaction to carfilzomib, or other medicines,  foods, dyes, or preservatives pregnant or trying to get pregnant breast-feeding How should I use this medication? This medicine is for injection or infusion into a vein. It is given by a health care professional in a hospital or clinic setting. Talk to your pediatrician regarding the use of this medicine in children. Special care may be needed. Overdosage: If you think you have taken too much of this medicine contact a poison control center or emergency room at once. NOTE: This medicine is only for you. Do not share this medicine with others. What if I miss a dose? It is important not to miss your dose. Call your doctor or health care professional if you are unable to keep an appointment. What may interact with this medication? Interactions are not expected. This list may not describe all possible interactions. Give your health care provider a list of all the medicines, herbs, non-prescription drugs, or dietary supplements you use. Also tell them if you smoke, drink alcohol, or use illegal drugs. Some items may interact with your medicine. What should I watch for while using this medication? Your condition will be monitored while you are receiving this medicine. You may need blood work done while you are taking this medicine. Do not become pregnant while taking this medicine or for 6 months after stopping it. Women should inform their health care provider if they wish to become pregnant or think they might be pregnant. Men should not father a child while taking this medicine and for 3 months after stopping it. There is a potential for serious side effects to an unborn child. Talk to your health care provider for more information. Do not breast-feed an infant while taking this medicine or for 2 weeks after stopping it. Check with your health care provider if you have severe diarrhea, nausea, and vomiting, or if you sweat a lot. The loss of too much body fluid may make it dangerous for you to take  this medicine. You may get drowsy or dizzy. Do not drive, use machinery, or do anything that needs mental alertness until you know how this medicine affects you. Do not stand up or sit up quickly, especially if you are an older patient. This reduces the risk of dizzy or fainting spells. What side effects may I notice from receiving this medication? Side effects that you should report to your doctor or health care professional as soon as possible: allergic reactions like skin rash, itching or hives, swelling of the face, lips, or tongue confusion dizziness feeling faint or lightheaded fever or chills palpitations seizures signs and symptoms of bleeding such as bloody or black, tarry stools; red or dark-brown urine; spitting up blood or brown material that looks like coffee grounds; red spots on the skin; unusual bruising or bleeding including from the eye, gums, or nose signs and symptoms of a blood clot such as breathing problems; changes in vision; chest pain; severe, sudden headache; pain, swelling, warmth in the leg; trouble speaking; sudden numbness or weakness of the face, arm or leg signs and symptoms of kidney injury like trouble passing urine or change in the amount of urine signs and symptoms of liver injury like dark  yellow or brown urine; general ill feeling or flu-like symptoms; light-colored stools; loss of appetite; nausea; right upper belly pain; unusually weak or tired; yellowing of the eyes or skin Side effects that usually do not require medical attention (report to your doctor or health care professional if they continue or are bothersome): back pain cough diarrhea headache muscle cramps trouble sleeping vomiting This list may not describe all possible side effects. Call your doctor for medical advice about side effects. You may report side effects to FDA at 1-800-FDA-1088. Where should I keep my medication? This drug is given in a hospital or clinic and will not be stored  at home. NOTE: This sheet is a summary. It may not cover all possible information. If you have questions about this medicine, talk to your doctor, pharmacist, or health care provider.  2023 Elsevier/Gold Standard (2020-08-19 00:00:00)

## 2022-01-06 NOTE — Progress Notes (Incomplete)
[facility] [phone]   [phone]  PROGRESS NOTE  Patient Care Team: [name], MD as PCP - General (Family Medicine) [name], MD as Consulting Physician (Physical Medicine and Rehabilitation) Donia Guiles Lavon Paganini, PA-C as Physician Assistant (Physical Medicine and Rehabilitation) Orson Slick, MD as Consulting Physician (Hematology and Oncology)  Hematological/Oncological History # IgG Lambda Multiple Myeloma, Relapsed (ISS Stage II) 1) 06/2010: initial diagnosis of Multiple Myeloma after T8 compression fracture. Treated with Velcade/Revlimid/Dexamethasone and achieved a complete remission 2) Velcade was discontinued in September 2012 and that Revlimid and Decadron were discontinued in March 2013. 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 4) Followed by Dr. Alvy Bimler, last clinic visit 10/09/2019. At that time there was concern for relapse of his multiple myeloma.  5) Patient requested transfer to different provider after misunderstanding regarding imaging studies 6) 12/17/2019: transfer care to Dr. Lorenso Courier  7) 01/09/2020: Cycle 1 Day 1 of Dara/Velcade/Dex 8) 01/21/2020: presented as urgent visit for diarrhea and dehydration. Holding chemotherapy scheduled for 01/23/2020. 9) 01/30/2020: Resume dara/velcade/dex after resolution of diarrhea.  10) 02/13/2020: restaging labs show M protein 0.8, Kappa 4.5, lamba 17.2, ratio 0.26, urine M protein 53 (7.1%). All MM labs indicate improvement.  11) 03/10/2020: Cycle 4 Day 1 of Dara/Velcade/Dex. Transition to q 3 week daratumumab.  12) 06/04/2020:  Cycle 8 Day 1 of Dara/Velcade/Dex 13) 06/25/2020:  Cycle 9 Day 1 of Dara/Velcade/Dex 14) 07/22/2020: Cycle 10 Day 1 of  Dara//Dex 15) 08/18/2020: Cycle 11 Day 1 of Dara//Dex 16) 09/23/2020: Cycle 12 Day 1 of Dara//Dex 17) 10/22/2020: Cycle 13 Day 1 of Dara/Dex   18) 11/19/2020: Cycle 14 Day 1 of Dara/Dex  19) 12/17/2020: Cycle 15 Day 1 of Dara/Dex  20) 01/17/2021: Cycle 16 Day 1 of Dara/Dex  21) 02/11/2021: Cycle 17 Day 1 of Dara/Dex 22) 03/10/2021: Cycle 18 Day 1 of Dara/Dex  23) 04/08/2021: Cycle 19 Day 1 of Dara/Dex  24) 08/03/2021: Cycle 20 Day 1 of Dara/Dex (delayed due to scheduling error) 25) 09/02/2021: Cycle 21 Day 1 of Dara/Dex 26) 09/30/2021: Cycle 22 Day 1 of Dara/Dex 27) 10/28/2021: Cycle 23 Day 1 of Dara/Dex 28) 12/02/2021: Cycle 24 Day 1 of Dara/Dex 29) 12/30/2021: Cycle 25 Day 1 of Dara/Dex 30) 01/06/2022: Cycle 1 Day 1 of Kyprolis/Dex  Interval History:  [name] 66 y.o. male with medical history significant for  IgG Lambda Multiple Myeloma who presents for a follow up visit. The patient's last visit was on 12/30/2021.  He presents today to start treatment with cycle 1 of Kyprolis/Dex.   On exam today Mr. Eckford reports ***  He denies any fevers, chills, night sweats, shortness of breath, chest pain or cough. He has no other complaints.A full 10 point ROS is listed below.  MEDICAL HISTORY:  Past Medical History:  Diagnosis Date   Adrenal insufficiency ([facility])    on chronic dexamethasone   Anemia    Cancer (HCC)    Coagulopathy (HCC)    on xeralto/ s/p DVT while on coumadin,  IVC in place   Diabetes mellitus without complication ([facility])    type 2   Gross hematuria 7/14   post foley cath procedure   History of blood transfusion 7/14   Multiple myeloma    thoracic T8 with paraplegia s/p resection- on chemo at visit 10/13/10   Multiple myeloma    Multiple myeloma without mention of  remission    Neurogenic bladder    Neurogenic bowel    Paraplegia (HCC)    Partial small bowel obstruction ([place] [facility]) during dec 2011 admission    SURGICAL HISTORY: Past Surgical History:  Procedure Laterality Date   COLONOSCOPY WITH PROPOFOL N/A 04/12/2017   Procedure: COLONOSCOPY WITH PROPOFOL;  Surgeon: Irene Shipper, MD;  Location: [facility];   Service: Endoscopy;  Laterality: N/A;   COLONOSCOPY WITH PROPOFOL N/A 04/19/2017   Procedure: COLONOSCOPY WITH PROPOFOL;  Surgeon: Yetta Flock, MD;  Location: [facility];  Service: Gastroenterology;  Laterality: N/A;   COLOSTOMY  07/20/2011   Procedure: COLOSTOMY;  Surgeon: Judieth Keens, DO;  Location: [facility];  Service: General;;   COLOSTOMY REVISION  07/20/2011   Procedure: COLON RESECTION SIGMOID;  Surgeon: Judieth Keens, DO;  Location: [facility];  Service: General;;   CYSTOSCOPY N/A 04/04/2013   Procedure: CYSTOSCOPY WITH LITHALOPAXY;  Surgeon: Alexis Frock, MD;  Location: [facility];  Service: Urology;  Laterality: N/A;   INSERTION OF SUPRAPUBIC CATHETER N/A 04/04/2013   Procedure: INSERTION OF SUPRAPUBIC CATHETER;  Surgeon: Alexis Frock, MD;  Location: [facility];  Service: Urology;  Laterality: N/A;   LAPAROTOMY  07/20/2011   Procedure: EXPLORATORY LAPAROTOMY;  Surgeon: Judieth Keens, DO;  Location: [facility];  Service: General;  Laterality: N/A;   myeloma thoracic T8 with parpaplegia s/p thoracotomy and thoracic T7-9 cage placement on Dec 26th 2011  07/18/10    SOCIAL HISTORY: Social History   Socioeconomic History   Marital status: Married    Spouse name: Not on file   Number of children: Not on file   Years of education: Not on file   Highest education level: Not on file  Occupational History   Not on file  Tobacco Use   Smoking status: Never   Smokeless tobacco: Never  Vaping Use   Vaping Use: Never used  Substance and Sexual Activity   Alcohol use: No   Drug use: No   Sexual activity: Never  Other Topics Concern   Not on file  Social History Narrative   Not on file   Social Determinants of Health   Financial Resource Strain: Not on file  Food Insecurity: Not on file  Transportation Needs: Not on file  Physical Activity: Not on file  Stress: Not on file  Social Connections: Not on file  Intimate Partner Violence: Not on file    FAMILY  HISTORY: Family History  Problem Relation Age of Onset   Ovarian cancer Mother    Diabetes Father     ALLERGIES:  is allergic to feraheme [ferumoxytol].  MEDICATIONS:  Current Outpatient Medications  Medication Sig Dispense Refill   acyclovir (ZOVIRAX) 400 MG tablet Take 1 tablet (400 mg total) by mouth 2 (two) times daily. 60 tablet 2   atorvastatin (LIPITOR) 10 MG tablet Take 10 mg by mouth daily.     baclofen (LIORESAL) 20 MG tablet Take 20 mg by mouth 2 (two) times daily.     diazepam (DIASTAT ACUDIAL) 10 MG GEL SMARTSIG:By Mouth     dorzolamide (TRUSOPT) 2 % ophthalmic solution 1 drop 2 (two) times daily.     iron polysaccharides (NU-IRON) 150 MG capsule Take 1 capsule (150 mg total) by mouth daily. 30 capsule 0   latanoprost (XALATAN) 0.005 % ophthalmic solution Place 1 drop into both eyes at bedtime.     ondansetron (ZOFRAN) 8 MG tablet Take 1 tablet (8 mg total) by mouth every 8 (eight) hours  as needed for nausea or vomiting. 30 tablet 0   rivaroxaban (XARELTO) 10 MG TABS tablet Take 1 tablet (10 mg total) by mouth daily with supper. 30 tablet 9   Skin Protectants, Misc. (EUCERIN) cream Apply 1 application topically 2 (two) times daily as needed for dry skin.      sulfamethoxazole-trimethoprim (BACTRIM DS) 800-160 MG tablet Take 1 tablet by mouth 2 (two) times daily.     zinc oxide (BALMEX) 11.3 % CREA cream Apply 1 application topically 2 (two) times daily.     No current facility-administered medications for this visit.   Facility-Administered Medications Ordered in Other Visits  Medication Dose Route Frequency Provider Last Rate Last Admin   sodium chloride flush (NS) 0.9 % injection 10 mL  10 mL Intravenous PRN Alvy Bimler, Ni, MD        REVIEW OF SYSTEMS:   Constitutional: ( - ) fevers, ( - )  chills , ( - ) night sweats Eyes: ( - ) blurriness of vision, ( - ) double vision, ( - ) watery eyes Ears, nose, mouth, throat, and face: ( - ) mucositis, ( - ) sore  throat Respiratory: ( - ) cough, ( - ) dyspnea, ( - ) wheezes Cardiovascular: ( - ) palpitation, ( - ) chest discomfort, ( - ) lower extremity swelling Gastrointestinal:  ( - ) nausea, ( - ) heartburn, ( - ) change in bowel habits Skin: ( - ) abnormal skin rashes Lymphatics: ( - ) new lymphadenopathy, ( - ) easy bruising Neurological: ( - ) numbness, ( - ) tingling, ( - ) new weaknesses Behavioral/Psych: ( - ) mood change, ( - ) new changes  All other systems were reviewed with the patient and are negative.  PHYSICAL EXAMINATION: ECOG PERFORMANCE STATUS: paraplegic.   Vitals:   01/06/22 1053  BP: 109/72  Pulse: 79  Resp: 16  Temp: (!) 97.2 F (36.2 C)  SpO2: 99%      Filed Weights   GENERAL: well appearing middle aged [place] American male alert, no distress and comfortable SKIN: skin color, texture, turgor are normal, no rashes or significant lesions EYES: conjunctiva are pink and non-injected, sclera clear LUNGS: clear to auscultation and percussion with normal breathing effort HEART: regular rate & rhythm and no murmurs and no lower extremity edema Musculoskeletal: no cyanosis of digits and no clubbing  PSYCH: alert & oriented x 3, fluent speech NEURO: paraplegic, no use of LE bilaterally.   LABORATORY DATA:  I have reviewed the data as listed    Latest Ref Rng & Units 12/30/2021   10:49 AM 12/02/2021   10:00 AM 10/28/2021   11:11 AM  CBC  WBC 4.0 - 10.5 K/uL 7.2  8.2  8.1   Hemoglobin 13.0 - 17.0 g/dL 11.8  11.5  13.1   Hematocrit 39.0 - 52.0 % 33.5  32.0  37.8   Platelets 150 - 400 K/uL 299  311  297        Latest Ref Rng & Units 12/30/2021   10:49 AM 12/02/2021   10:00 AM 10/28/2021   11:11 AM  CMP  Glucose 70 - 99 mg/dL 138  151  116   BUN 8 - 23 mg/dL 12  14  14    Creatinine 0.61 - 1.24 mg/dL 0.83  0.94  0.86   Sodium 135 - 145 mmol/L 140  139  140   Potassium 3.5 - 5.1 mmol/L 3.8  4.1  4.0   Chloride 98 - 111  mmol/L 107  107  105   CO2 22 - 32 mmol/L 28   26  29    Calcium 8.9 - 10.3 mg/dL 9.7  9.2  9.5   Total Protein 6.5 - 8.1 g/dL 7.6  7.7  8.0   Total Bilirubin 0.3 - 1.2 mg/dL 0.4  0.3  0.4   Alkaline Phos 38 - 126 U/L 97  94  108   AST 15 - 41 U/L 17  17  15    ALT 0 - 44 U/L 19  22  18      Lab Results  Component Value Date   MPROTEIN 1.4 (H) 12/30/2021   MPROTEIN 1.2 (H) 12/02/2021   MPROTEIN 1.3 (H) 10/28/2021   Lab Results  Component Value Date   KPAFRELGTCHN 4.9 12/30/2021   KPAFRELGTCHN 4.2 12/02/2021   KPAFRELGTCHN 4.8 10/28/2021   LAMBDASER 81.6 (H) 12/30/2021   LAMBDASER 74.3 (H) 12/02/2021   LAMBDASER 70.0 (H) 10/28/2021   KAPLAMBRATIO 0.06 (L) 12/30/2021   KAPLAMBRATIO 0.06 (L) 12/02/2021   KAPLAMBRATIO 0.07 (L) 10/28/2021    RADIOGRAPHIC STUDIES: No results found.  ASSESSMENT & PLAN [name] 66 y.o. male with medical history significant for  IgG Lambda Multiple Myeloma who presents for a follow up visit.  After review of the labs, discussion with the patient, and reviewed the imaging his findings are most consistent with a relapsed multiple myeloma.The patient has had excellent success before with treatment of his myeloma with Velcade, Revlimid, and dexamethasone.  Treatment regimen consists of bortezomib 1.60m/m2 subq (Day 1,8, and 15), daratumumab 1800 mg subq (day 1,8 and 15), and dexamethasone 20 mg PO (day 1, 8 and 15). After 3 cycles, daratumumab to be administered q 3 weeks (from cycles 4-8). After cycle 8, continue daratumumab q 4 weeks alone (N Engl J Med 2016; [id number]  # IgG Lambda Multiple Myeloma, Relapsed (ISS Stage II) --findings are most consistent with relapsed multiple myeloma. Patient previously successfully treated with Velcade/Rev/Dex --current plan is Daratumumab/Velcade/Dex  (started 01/09/2020). On 07/02/2020 he transitioned to monthly daratumumab alone. Continue q 4 week dara. --Most recent SPEP/IFE and sFLC showed slight increase in M protein measuring 1.4. K 4.9, L 81.6.0 and  Ratio 0.06. --labs today were reviewed and adequate for treatment. SPEP/IFE and sFLC pending today.  --patient will proceed with Cycle 25 today.  --return to transition to Kyprolis in 2 weeks.   #History of DVT --He had placement of IVC filter, remains on Xarelto. --Due to poor mobility, and lack of bleeding complications, I recommend he remain on Xarelto indefinitely. --caution if Plt count were to drop <50  # Supportive Care -- provided patient with an albuterol inhaler (for use with daratumumab) --acyclovir 4075mBID for VZV prophylaxis --zofran 38m56m8H PRN for nausea/vomiting  --Zometa can be considered after dental clearance, though with his prior episode of osteonecrosis the risk may outweigh the benefits. --patient recieved pretreatment RBC phenotype  No orders of the defined types were placed in this encounter.   All questions were answered. The patient knows to call the clinic with any problems, questions or concerns.  I have spent a total of 30 minutes minutes of face-to-face and non-face-to-face time, preparing to see the patient,  performing a medically appropriate examination, counseling and educating the patient, communicating with other health care professionals, documenting clinical information in the electronic health record,  and care coordination.   JohLedell PeoplesD Department of Hematology/Oncology [facility] LLCone: [phone]:  [phone] Email: [email or web address]  01/06/2022 11:05 AM   Literature Support:  Archie Endo, Weisel Raliegh Ip, [place], Mass[place] T, [place], [place] I, [place] V, [place] M, [place], Mark TM, Qi M, [place] J, [place], Qin X, Deraedt W, Ahmadi T, Spencer A, Sonnevel[place] P; [employer] Investigators. Daratumumab, Bortezomib, and Dexamethasone for Multiple Myeloma. [place] Med. 2016 Aug 25;375(8):754-66  --among patients with relapsed or relapsed and refractory multiple myeloma,  daratumumab in combination with bortezomib and dexamethasone resulted in significantly longer progression-free survival than bortezomib and dexamethasone alone and was associated with infusion-related reactions and higher rates of thrombocytopenia and neutropenia than bortezomib and dexamethasone alone.

## 2022-01-06 NOTE — Progress Notes (Signed)
Per Dr. Lorenso Courier ok to use labs from 12/30/21 for today's tx.

## 2022-01-09 ENCOUNTER — Telehealth: Payer: Self-pay | Admitting: *Deleted

## 2022-01-09 ENCOUNTER — Ambulatory Visit (HOSPITAL_COMMUNITY)
Admission: RE | Admit: 2022-01-09 | Discharge: 2022-01-09 | Disposition: A | Discharge status: Home / Self Care | Payer: Medicare (Managed Care) | Source: Ambulatory Visit | Attending: Hematology and Oncology | Admitting: Hematology and Oncology

## 2022-01-09 DIAGNOSIS — C9002 Multiple myeloma in relapse: Secondary | ICD-10-CM | POA: Diagnosis not present

## 2022-01-09 NOTE — Telephone Encounter (Signed)
-----   Message from Rolene Course, RN sent at 01/06/2022  2:48 PM EDT ----- Regarding: Lorenso Courier 1st Tx F/U call - Cecille Po 1st Tx F/U call - Kyprolis, tolerated infusion well.

## 2022-01-12 ENCOUNTER — Inpatient Hospital Stay: Payer: Medicare (Managed Care)

## 2022-01-12 ENCOUNTER — Other Ambulatory Visit: Payer: Self-pay | Admitting: Hematology and Oncology

## 2022-01-12 ENCOUNTER — Other Ambulatory Visit: Payer: Self-pay

## 2022-01-12 VITALS — BP 115/71 | HR 81 | Temp 98.6°F | Resp 20

## 2022-01-12 DIAGNOSIS — Z5111 Encounter for antineoplastic chemotherapy: Secondary | ICD-10-CM | POA: Diagnosis not present

## 2022-01-12 DIAGNOSIS — C9002 Multiple myeloma in relapse: Secondary | ICD-10-CM

## 2022-01-12 DIAGNOSIS — Z7189 Other specified counseling: Secondary | ICD-10-CM

## 2022-01-12 LAB — CMP (CANCER CENTER ONLY)
ALT: 28 U/L (ref 0–44)
AST: 19 U/L (ref 15–41)
Albumin: 3.6 g/dL (ref 3.5–5.0)
Alkaline Phosphatase: 91 U/L (ref 38–126)
Anion gap: 7 (ref 5–15)
BUN: 13 mg/dL (ref 8–23)
CO2: 26 mmol/L (ref 22–32)
Calcium: 9.2 mg/dL (ref 8.9–10.3)
Chloride: 107 mmol/L (ref 98–111)
Creatinine: 0.77 mg/dL (ref 0.61–1.24)
GFR, Estimated: >60 mL/min (ref >60)
Glucose, Bld: 163 mg/dL — ABNORMAL HIGH (ref 70–99)
Potassium: 3.5 mmol/L (ref 3.5–5.1)
Sodium: 140 mmol/L (ref 135–145)
Total Bilirubin: 0.5 mg/dL (ref 0.3–1.2)
Total Protein: 7 g/dL (ref 6.5–8.1)

## 2022-01-12 LAB — CBC WITH DIFFERENTIAL (CANCER CENTER ONLY)
Abs Immature Granulocytes: 0.03 10*3/uL (ref 0.00–0.07)
Basophils Absolute: 0 10*3/uL (ref 0.0–0.1)
Basophils Relative: 0 %
Eosinophils Absolute: 0.3 10*3/uL (ref 0.0–0.5)
Eosinophils Relative: 3 %
HCT: 31.6 % — ABNORMAL LOW (ref 39.0–52.0)
Hemoglobin: 11.5 g/dL — ABNORMAL LOW (ref 13.0–17.0)
Immature Granulocytes: 0 %
Lymphocytes Relative: 17 %
Lymphs Abs: 1.3 10*3/uL (ref 0.7–4.0)
MCH: 30.6 pg (ref 26.0–34.0)
MCHC: 36.4 g/dL — ABNORMAL HIGH (ref 30.0–36.0)
MCV: 84 fL (ref 80.0–100.0)
Monocytes Absolute: 0.7 10*3/uL (ref 0.1–1.0)
Monocytes Relative: 9 %
Neutro Abs: 5.6 10*3/uL (ref 1.7–7.7)
Neutrophils Relative %: 71 %
Platelet Count: 242 10*3/uL (ref 150–400)
RBC: 3.76 MIL/uL — ABNORMAL LOW (ref 4.22–5.81)
RDW: 16.4 % — ABNORMAL HIGH (ref 11.5–15.5)
WBC Count: 8 10*3/uL (ref 4.0–10.5)
nRBC: 0 % (ref 0.0–0.2)

## 2022-01-12 LAB — LACTATE DEHYDROGENASE: LDH: 79 U/L — ABNORMAL LOW (ref 98–192)

## 2022-01-12 MED ORDER — DEXTROSE 5 % IV SOLN
70.0000 mg/m2 | Freq: Once | INTRAVENOUS | Status: AC
  Administered 2022-01-12: 150 mg via INTRAVENOUS
  Filled 2022-01-12: qty 60

## 2022-01-12 MED ORDER — SODIUM CHLORIDE 0.9 % IV SOLN: Freq: Once | INTRAVENOUS | Status: AC

## 2022-01-12 MED ORDER — SODIUM CHLORIDE 0.9 % IV SOLN
40.0000 mg | Freq: Once | INTRAVENOUS | Status: AC
  Administered 2022-01-12: 40 mg via INTRAVENOUS
  Filled 2022-01-12: qty 4

## 2022-01-12 NOTE — Patient Instructions (Signed)
[facility]  Discharge Instructions: Thank you for choosing [facility] to provide your oncology and hematology care.   If you have a lab appointment with the [facility], please go directly to the [facility] and check in at the registration area.   Wear comfortable clothing and clothing appropriate for easy access to any Portacath or PICC line.   We strive to give you quality time with your provider. You may need to reschedule your appointment if you arrive late (15 or more minutes).  Arriving late affects you and other patients whose appointments are after yours.  Also, if you miss three or more appointments without notifying the office, you may be dismissed from the clinic at the provider's discretion.      For prescription refill requests, have your pharmacy contact our office and allow 72 hours for refills to be completed.    Today you received the following chemotherapy and/or immunotherapy agent: Carfilzomib (Kyprolis)   To help prevent nausea and vomiting after your treatment, we encourage you to take your nausea medication as directed.  BELOW ARE SYMPTOMS THAT SHOULD BE REPORTED IMMEDIATELY: *FEVER GREATER THAN 100.4 F (38 C) OR HIGHER *CHILLS OR SWEATING *NAUSEA AND VOMITING THAT IS NOT CONTROLLED WITH YOUR NAUSEA MEDICATION *UNUSUAL SHORTNESS OF BREATH *UNUSUAL BRUISING OR BLEEDING *URINARY PROBLEMS (pain or burning when urinating, or frequent urination) *BOWEL PROBLEMS (unusual diarrhea, constipation, pain near the anus) TENDERNESS IN MOUTH AND THROAT WITH OR WITHOUT PRESENCE OF ULCERS (sore throat, sores in mouth, or a toothache) UNUSUAL RASH, SWELLING OR PAIN  UNUSUAL VAGINAL DISCHARGE OR ITCHING   Items with * indicate a potential emergency and should be followed up as soon as possible or go to the Emergency Department if any problems should occur.  Please show the CHEMOTHERAPY ALERT CARD or IMMUNOTHERAPY ALERT CARD at  check-in to the Emergency Department and triage nurse.  Should you have questions after your visit or need to cancel or reschedule your appointment, please contact [facility]  Dept: [phone]  and follow the prompts.  Office hours are 8:00 a.m. to 4:30 p.m. Monday - Friday. Please note that voicemails left after 4:00 p.m. may not be returned until the following business day.  We are closed weekends and major holidays. You have access to a nurse at all times for urgent questions. Please call the main number to the clinic Dept: [phone] and follow the prompts.   For any non-urgent questions, you may also contact your provider using MyChart. We now offer e-Visits for anyone 19 and older to request care online for non-urgent symptoms. For details visit [email or web address].   Also download the MyChart app! Go to the app store, search "MyChart", open the app, select [facility], and log in with your MyChart username and password.  Masks are optional in the cancer centers. If you would like for your care team to wear a mask while they are taking care of you, please let them know. For doctor visits, patients may have with them one support person who is at least 67 years old. At this time, visitors are not allowed in the infusion area.

## 2022-01-13 ENCOUNTER — Inpatient Hospital Stay: Payer: Medicare (Managed Care)

## 2022-01-13 LAB — KAPPA/LAMBDA LIGHT CHAINS
Kappa free light chain: 5.3 mg/L (ref 3.3–19.4)
Kappa, lambda light chain ratio: 0.14 — ABNORMAL LOW (ref 0.26–1.65)
Lambda free light chains: 38.4 mg/L — ABNORMAL HIGH (ref 5.7–26.3)

## 2022-01-14 ENCOUNTER — Encounter: Payer: Self-pay | Admitting: Hematology and Oncology

## 2022-01-18 LAB — MULTIPLE MYELOMA PANEL, SERUM
Albumin SerPl Elph-Mcnc: 3.3 g/dL (ref 2.9–4.4)
Albumin/Glob SerPl: 1.1 (ref 0.7–1.7)
Alpha 1: 0.3 g/dL (ref 0.0–0.4)
Alpha2 Glob SerPl Elph-Mcnc: 0.9 g/dL (ref 0.4–1.0)
B-Globulin SerPl Elph-Mcnc: 0.9 g/dL (ref 0.7–1.3)
Gamma Glob SerPl Elph-Mcnc: 1.3 g/dL (ref 0.4–1.8)
Globulin, Total: 3.2 g/dL (ref 2.2–3.9)
IgA: 10 mg/dL — ABNORMAL LOW (ref 61–437)
IgG (Immunoglobin G), Serum: 1517 mg/dL (ref 603–1613)
IgM (Immunoglobulin M), Srm: 7 mg/dL — ABNORMAL LOW (ref 20–172)
M Protein SerPl Elph-Mcnc: 1.1 g/dL — ABNORMAL HIGH
Total Protein ELP: 6.5 g/dL (ref 6.0–8.5)

## 2022-01-20 ENCOUNTER — Inpatient Hospital Stay: Payer: Medicare (Managed Care)

## 2022-01-20 ENCOUNTER — Other Ambulatory Visit: Payer: Self-pay

## 2022-01-20 ENCOUNTER — Other Ambulatory Visit: Payer: Self-pay | Admitting: *Deleted

## 2022-01-20 ENCOUNTER — Inpatient Hospital Stay (HOSPITAL_BASED_OUTPATIENT_CLINIC_OR_DEPARTMENT_OTHER): Payer: Medicare (Managed Care) | Admitting: Physician Assistant

## 2022-01-20 VITALS — BP 119/71 | HR 81 | Temp 98.0°F | Resp 17 | Ht 68.0 in | Wt 253.0 lb

## 2022-01-20 DIAGNOSIS — Z5111 Encounter for antineoplastic chemotherapy: Secondary | ICD-10-CM | POA: Diagnosis not present

## 2022-01-20 DIAGNOSIS — C9002 Multiple myeloma in relapse: Secondary | ICD-10-CM | POA: Diagnosis not present

## 2022-01-20 DIAGNOSIS — Z7189 Other specified counseling: Secondary | ICD-10-CM

## 2022-01-20 LAB — CMP (CANCER CENTER ONLY)
ALT: 30 U/L (ref 0–44)
AST: 15 U/L (ref 15–41)
Albumin: 3.6 g/dL (ref 3.5–5.0)
Alkaline Phosphatase: 86 U/L (ref 38–126)
Anion gap: 7 (ref 5–15)
BUN: 14 mg/dL (ref 8–23)
CO2: 25 mmol/L (ref 22–32)
Calcium: 9 mg/dL (ref 8.9–10.3)
Chloride: 109 mmol/L (ref 98–111)
Creatinine: 0.73 mg/dL (ref 0.61–1.24)
GFR, Estimated: >60 mL/min (ref >60)
Glucose, Bld: 141 mg/dL — ABNORMAL HIGH (ref 70–99)
Potassium: 3.8 mmol/L (ref 3.5–5.1)
Sodium: 141 mmol/L (ref 135–145)
Total Bilirubin: 0.5 mg/dL (ref 0.3–1.2)
Total Protein: 6.6 g/dL (ref 6.5–8.1)

## 2022-01-20 LAB — CBC WITH DIFFERENTIAL (CANCER CENTER ONLY)
Abs Immature Granulocytes: 0.02 10*3/uL (ref 0.00–0.07)
Basophils Absolute: 0 10*3/uL (ref 0.0–0.1)
Basophils Relative: 0 %
Eosinophils Absolute: 0.2 10*3/uL (ref 0.0–0.5)
Eosinophils Relative: 4 %
HCT: 30.5 % — ABNORMAL LOW (ref 39.0–52.0)
Hemoglobin: 11.2 g/dL — ABNORMAL LOW (ref 13.0–17.0)
Immature Granulocytes: 0 %
Lymphocytes Relative: 14 %
Lymphs Abs: 0.8 10*3/uL (ref 0.7–4.0)
MCH: 30.8 pg (ref 26.0–34.0)
MCHC: 36.7 g/dL — ABNORMAL HIGH (ref 30.0–36.0)
MCV: 83.8 fL (ref 80.0–100.0)
Monocytes Absolute: 0.5 10*3/uL (ref 0.1–1.0)
Monocytes Relative: 9 %
Neutro Abs: 4.4 10*3/uL (ref 1.7–7.7)
Neutrophils Relative %: 73 %
Platelet Count: 185 10*3/uL (ref 150–400)
RBC: 3.64 MIL/uL — ABNORMAL LOW (ref 4.22–5.81)
RDW: 16.3 % — ABNORMAL HIGH (ref 11.5–15.5)
WBC Count: 6 10*3/uL (ref 4.0–10.5)
nRBC: 0 % (ref 0.0–0.2)

## 2022-01-20 LAB — LACTATE DEHYDROGENASE: LDH: 89 U/L — ABNORMAL LOW (ref 98–192)

## 2022-01-20 MED ORDER — DEXTROSE 5 % IV SOLN
70.0000 mg/m2 | Freq: Once | INTRAVENOUS | Status: AC
  Administered 2022-01-20: 150 mg via INTRAVENOUS
  Filled 2022-01-20: qty 60

## 2022-01-20 MED ORDER — SODIUM CHLORIDE 0.9 % IV SOLN
40.0000 mg | Freq: Once | INTRAVENOUS | Status: AC
  Administered 2022-01-20: 40 mg via INTRAVENOUS
  Filled 2022-01-20: qty 4

## 2022-01-20 MED ORDER — SODIUM CHLORIDE 0.9 % IV SOLN: Freq: Once | INTRAVENOUS | Status: AC

## 2022-01-20 NOTE — Patient Instructions (Signed)
[facility]   Discharge Instructions: Thank you for choosing [facility] to provide your oncology and hematology care.   If you have a lab appointment with the [facility], please go directly to the [facility] and check in at the registration area.   Wear comfortable clothing and clothing appropriate for easy access to any Portacath or PICC line.   We strive to give you quality time with your provider. You may need to reschedule your appointment if you arrive late (15 or more minutes).  Arriving late affects you and other patients whose appointments are after yours.  Also, if you miss three or more appointments without notifying the office, you may be dismissed from the clinic at the provider's discretion.      For prescription refill requests, have your pharmacy contact our office and allow 72 hours for refills to be completed.    Today you received the following chemotherapy and/or immunotherapy agents: carfilzomib      To help prevent nausea and vomiting after your treatment, we encourage you to take your nausea medication as directed.  BELOW ARE SYMPTOMS THAT SHOULD BE REPORTED IMMEDIATELY: *FEVER GREATER THAN 100.4 F (38 C) OR HIGHER *CHILLS OR SWEATING *NAUSEA AND VOMITING THAT IS NOT CONTROLLED WITH YOUR NAUSEA MEDICATION *UNUSUAL SHORTNESS OF BREATH *UNUSUAL BRUISING OR BLEEDING *URINARY PROBLEMS (pain or burning when urinating, or frequent urination) *BOWEL PROBLEMS (unusual diarrhea, constipation, pain near the anus) TENDERNESS IN MOUTH AND THROAT WITH OR WITHOUT PRESENCE OF ULCERS (sore throat, sores in mouth, or a toothache) UNUSUAL RASH, SWELLING OR PAIN  UNUSUAL VAGINAL DISCHARGE OR ITCHING   Items with * indicate a potential emergency and should be followed up as soon as possible or go to the Emergency Department if any problems should occur.  Please show the CHEMOTHERAPY ALERT CARD or IMMUNOTHERAPY ALERT CARD at check-in  to the Emergency Department and triage nurse.  Should you have questions after your visit or need to cancel or reschedule your appointment, please contact [facility]  Dept: [phone]  and follow the prompts.  Office hours are 8:00 a.m. to 4:30 p.m. Monday - Friday. Please note that voicemails left after 4:00 p.m. may not be returned until the following business day.  We are closed weekends and major holidays. You have access to a nurse at all times for urgent questions. Please call the main number to the clinic Dept: [phone] and follow the prompts.   For any non-urgent questions, you may also contact your provider using MyChart. We now offer e-Visits for anyone 51 and older to request care online for non-urgent symptoms. For details visit [email or web address].   Also download the MyChart app! Go to the app store, search "MyChart", open the app, select [facility], and log in with your MyChart username and password.  Masks are optional in the cancer centers. If you would like for your care team to wear a mask while they are taking care of you, please let them know. For doctor visits, patients may have with them one support person who is at least 68 years old. At this time, visitors are not allowed in the infusion area.

## 2022-01-20 NOTE — Progress Notes (Signed)
[facility] [phone]   [phone]  PROGRESS NOTE  Patient Care Team: [name], MD as PCP - General (Family Medicine) [name], MD as Consulting Physician (Physical Medicine and Rehabilitation) Donia Guiles Lavon Paganini, PA-C as Physician Assistant (Physical Medicine and Rehabilitation) Orson Slick, MD as Consulting Physician (Hematology and Oncology)  Hematological/Oncological History # IgG Lambda Multiple Myeloma, Relapsed (ISS Stage II) 1) 06/2010: initial diagnosis of Multiple Myeloma after T8 compression fracture. Treated with Velcade/Revlimid/Dexamethasone and achieved a complete remission 2) Velcade was discontinued in September 2012 and that Revlimid and Decadron were discontinued in March 2013. 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 4) Followed by Dr. Alvy Bimler, last clinic visit 10/09/2019. At that time there was concern for relapse of his multiple myeloma.  5) Patient requested transfer to different provider after misunderstanding regarding imaging studies 6) 12/17/2019: transfer care to Dr. Lorenso Courier  7) 01/09/2020: Cycle 1 Day 1 of Dara/Velcade/Dex 8) 01/21/2020: presented as urgent visit for diarrhea and dehydration. Holding chemotherapy scheduled for 01/23/2020. 9) 01/30/2020: Resume dara/velcade/dex after resolution of diarrhea.  10) 02/13/2020: restaging labs show M protein 0.8, Kappa 4.5, lamba 17.2, ratio 0.26, urine M protein 53 (7.1%). All MM labs indicate improvement.  11) 03/10/2020: Cycle 4 Day 1 of Dara/Velcade/Dex. Transition to q 3 week daratumumab.  12) 06/04/2020:  Cycle 8 Day 1 of Dara/Velcade/Dex 13) 06/25/2020:  Cycle 9 Day 1 of Dara/Velcade/Dex 14) 07/22/2020: Cycle 10 Day 1 of  Dara//Dex 15) 08/18/2020: Cycle 11 Day 1 of Dara//Dex 16) 09/23/2020: Cycle 12 Day 1 of Dara//Dex 17) 10/22/2020: Cycle 13 Day 1 of Dara/Dex   18) 11/19/2020: Cycle 14 Day 1 of Dara/Dex  19) 12/17/2020: Cycle 15 Day 1 of Dara/Dex  20) 01/17/2021: Cycle 16 Day 1 of Dara/Dex  21) 02/11/2021: Cycle 17 Day 1 of Dara/Dex 22) 03/10/2021: Cycle 18 Day 1 of Dara/Dex  23) 04/08/2021: Cycle 19 Day 1 of Dara/Dex  24) 08/03/2021: Cycle 20 Day 1 of Dara/Dex (delayed due to scheduling error) 25) 09/02/2021: Cycle 21 Day 1 of Dara/Dex 26) 09/30/2021: Cycle 22 Day 1 of Dara/Dex 27) 10/28/2021: Cycle 23 Day 1 of Dara/Dex 28) 12/02/2021: Cycle 24 Day 1 of Dara/Dex 29) 12/30/2021: Cycle 25 Day 1 of Dara/Dex 30) 01/06/2022: Cycle 1 Day 1 of Kyprolis/Dex  Interval History:  [name] 66 y.o. male with medical history significant for  IgG Lambda Multiple Myeloma who presents for a follow up visit. The patient's last visit was on 12/30/2021.  He presents today to for cycle 1 day 15 of Kyprolis/Dex.   On exam today Mr. Matich reports there have been no major changes in his health in the interim since his last visit.  He is eating well and denies any changes to his energy levels. He is awaiting urinary testing to determine if he has a UTI. He would like to loose some weight. He denies any nausea, vomiting or changes in his stool output. He denies any fevers, chills, night sweats, shortness of breath, chest pain or cough. He has no other complaints.A full 10 point ROS is listed below.  MEDICAL HISTORY:  Past Medical History:  Diagnosis Date   Adrenal insufficiency ([facility])    on chronic dexamethasone   Anemia    Cancer ([facility])    Coagulopathy ([facility])    on xeralto/ s/p DVT while on coumadin,  IVC in place  Diabetes mellitus without complication ([facility])    type 2   Gross hematuria 7/14   post foley cath procedure   History of blood transfusion 7/14   Multiple myeloma    thoracic T8 with paraplegia s/p resection- on chemo at visit 10/13/10   Multiple myeloma    Multiple myeloma without mention of remission    Neurogenic bladder    Neurogenic bowel     Paraplegia ([facility])    Partial small bowel obstruction ([facility]) during dec 2011 admission    SURGICAL HISTORY: Past Surgical History:  Procedure Laterality Date   COLONOSCOPY WITH PROPOFOL N/A 04/12/2017   Procedure: COLONOSCOPY WITH PROPOFOL;  Surgeon: Rania Prothero Shipper, MD;  Location: [facility];  Service: Endoscopy;  Laterality: N/A;   COLONOSCOPY WITH PROPOFOL N/A 04/19/2017   Procedure: COLONOSCOPY WITH PROPOFOL;  Surgeon: Yetta Flock, MD;  Location: [facility];  Service: Gastroenterology;  Laterality: N/A;   COLOSTOMY  07/20/2011   Procedure: COLOSTOMY;  Surgeon: Judieth Keens, DO;  Location: [facility];  Service: General;;   COLOSTOMY REVISION  07/20/2011   Procedure: COLON RESECTION SIGMOID;  Surgeon: Judieth Keens, DO;  Location: [facility];  Service: General;;   CYSTOSCOPY N/A 04/04/2013   Procedure: CYSTOSCOPY WITH LITHALOPAXY;  Surgeon: Alexis Frock, MD;  Location: [facility];  Service: Urology;  Laterality: N/A;   INSERTION OF SUPRAPUBIC CATHETER N/A 04/04/2013   Procedure: INSERTION OF SUPRAPUBIC CATHETER;  Surgeon: Alexis Frock, MD;  Location: [facility];  Service: Urology;  Laterality: N/A;   LAPAROTOMY  07/20/2011   Procedure: EXPLORATORY LAPAROTOMY;  Surgeon: Judieth Keens, DO;  Location: [facility];  Service: General;  Laterality: N/A;   myeloma thoracic T8 with parpaplegia s/p thoracotomy and thoracic T7-9 cage placement on Dec 26th 2011  07/18/10    SOCIAL HISTORY: Social History   Socioeconomic History   Marital status: Married    Spouse name: Not on file   Number of children: Not on file   Years of education: Not on file   Highest education level: Not on file  Occupational History   Not on file  Tobacco Use   Smoking status: Never   Smokeless tobacco: Never  Vaping Use   Vaping Use: Never used  Substance and Sexual Activity   Alcohol use: No   Drug use: No   Sexual activity: Never  Other Topics Concern   Not on file  Social History Narrative   Not on  file   Social Determinants of Health   Financial Resource Strain: Not on file  Food Insecurity: Not on file  Transportation Needs: Not on file  Physical Activity: Not on file  Stress: Not on file  Social Connections: Not on file  Intimate Partner Violence: Not on file    FAMILY HISTORY: Family History  Problem Relation Age of Onset   Ovarian cancer Mother    Diabetes Father     ALLERGIES:  is allergic to feraheme [ferumoxytol].  MEDICATIONS:  Current Outpatient Medications  Medication Sig Dispense Refill   acyclovir (ZOVIRAX) 400 MG tablet Take 1 tablet (400 mg total) by mouth 2 (two) times daily. 60 tablet 2   atorvastatin (LIPITOR) 10 MG tablet Take 10 mg by mouth daily.     baclofen (LIORESAL) 20 MG tablet Take 20 mg by mouth 2 (two) times daily.     diazepam (DIASTAT ACUDIAL) 10 MG GEL SMARTSIG:By Mouth     dorzolamide (TRUSOPT) 2 % ophthalmic solution 1 drop 2 (two) times daily.  iron polysaccharides (NU-IRON) 150 MG capsule Take 1 capsule (150 mg total) by mouth daily. 30 capsule 0   latanoprost (XALATAN) 0.005 % ophthalmic solution Place 1 drop into both eyes at bedtime.     ondansetron (ZOFRAN) 8 MG tablet Take 1 tablet (8 mg total) by mouth every 8 (eight) hours as needed for nausea or vomiting. 30 tablet 0   rivaroxaban (XARELTO) 10 MG TABS tablet Take 1 tablet (10 mg total) by mouth daily with supper. 30 tablet 9   Skin Protectants, Misc. (EUCERIN) cream Apply 1 application topically 2 (two) times daily as needed for dry skin.      sulfamethoxazole-trimethoprim (BACTRIM DS) 800-160 MG tablet Take 1 tablet by mouth 2 (two) times daily.     zinc oxide (BALMEX) 11.3 % CREA cream Apply 1 application topically 2 (two) times daily.     No current facility-administered medications for this visit.   Facility-Administered Medications Ordered in Other Visits  Medication Dose Route Frequency Provider Last Rate Last Admin   sodium chloride flush (NS) 0.9 % injection 10 mL   10 mL Intravenous PRN Alvy Bimler, Ni, MD        REVIEW OF SYSTEMS:   Constitutional: ( - ) fevers, ( - )  chills , ( - ) night sweats Eyes: ( - ) blurriness of vision, ( - ) double vision, ( - ) watery eyes Ears, nose, mouth, throat, and face: ( - ) mucositis, ( - ) sore throat Respiratory: ( - ) cough, ( - ) dyspnea, ( - ) wheezes Cardiovascular: ( - ) palpitation, ( - ) chest discomfort, ( - ) lower extremity swelling Gastrointestinal:  ( - ) nausea, ( - ) heartburn, ( - ) change in bowel habits Skin: ( - ) abnormal skin rashes Lymphatics: ( - ) new lymphadenopathy, ( - ) easy bruising Neurological: ( - ) numbness, ( - ) tingling, ( - ) new weaknesses Behavioral/Psych: ( - ) mood change, ( - ) new changes  All other systems were reviewed with the patient and are negative.  PHYSICAL EXAMINATION: ECOG PERFORMANCE STATUS: paraplegic.   Vitals:   01/20/22 1017  BP: 119/71  Pulse: 81  Resp: 17  Temp: 98 F (36.7 C)  SpO2: 100%      Filed Weights   01/20/22 1017  Weight: 253 lb (114.8 kg)   GENERAL: well appearing middle aged [place] American male alert, no distress and comfortable SKIN: skin color, texture, turgor are normal, no rashes or significant lesions EYES: conjunctiva are pink and non-injected, sclera clear LUNGS: clear to auscultation and percussion with normal breathing effort HEART: regular rate & rhythm and no murmurs and no lower extremity edema Musculoskeletal: no cyanosis of digits and no clubbing  PSYCH: alert & oriented x 3, fluent speech NEURO: paraplegic, no use of LE bilaterally.   LABORATORY DATA:  I have reviewed the data as listed    Latest Ref Rng & Units 01/20/2022   10:04 AM 01/12/2022    8:22 AM 12/30/2021   10:49 AM  CBC  WBC 4.0 - 10.5 K/uL 6.0  8.0  7.2   Hemoglobin 13.0 - 17.0 g/dL 11.2  11.5  11.8   Hematocrit 39.0 - 52.0 % 30.5  31.6  33.5   Platelets 150 - 400 K/uL 185  242  299        Latest Ref Rng & Units 01/20/2022   10:04 AM  01/12/2022    8:22 AM 12/30/2021   10:49 AM  CMP  Glucose 70 - 99 mg/dL 141  163  138   BUN 8 - 23 mg/dL _0 Creatinine 0.61 - 1.24 mg/dL 0.73  0.77  0.83   Sodium 135 - 145 mmol/L 141  140  140   Potassium 3.5 - 5.1 mmol/L 3.8  3.5  3.8   Chloride 98 - 111 mmol/L 109  107  107   CO2 22 - 32 mmol/L _1 Calcium 8.9 - 10.3 mg/dL 9.0  9.2  9.7   Total Protein 6.5 - 8.1 g/dL 6.6  7.0  7.6   Total Bilirubin 0.3 - 1.2 mg/dL 0.5  0.5  0.4   Alkaline Phos 38 - 126 U/L 86  91  97   AST 15 - 41 U/L _2 ALT 0 - 44 U/L _3 Lab Results  Component Value Date   MPROTEIN 1.1 (H) 01/12/2022   MPROTEIN 1.4 (H) 12/30/2021   MPROTEIN 1.2 (H) 12/02/2021   Lab Results  Component Value Date   KPAFRELGTCHN 5.3 01/12/2022   KPAFRELGTCHN 4.9 12/30/2021   KPAFRELGTCHN 4.2 12/02/2021   LAMBDASER 38.4 (H) 01/12/2022   LAMBDASER 81.6 (H) 12/30/2021   LAMBDASER 74.3 (H) 12/02/2021   KAPLAMBRATIO 0.14 (L) 01/12/2022   KAPLAMBRATIO 0.06 (L) 12/30/2021   KAPLAMBRATIO 0.06 (L) 12/02/2021    RADIOGRAPHIC STUDIES: DG Bone Survey Met  Result Date: 01/09/2022 CLINICAL DATA:  Multiple myeloma in relapse. Assess for worsening lytic lesions. EXAM: METASTATIC BONE SURVEY COMPARISON:  Bone survey 10/02/2019., CT of the femur 11/28/2019 reviewed FINDINGS: Two adjacent lytic lesions in the right proximal femoral diaphysis are similar or slightly diminished in size, superior lesion spanning 5.5 cm and inferior lesion spanning 3.6 cm, previously 5.3 and 3.8 cm respectively. There is associated endosteal scalloping but no pathologic fracture or periosteal reaction. Lytic lesion in the left mid femoral diaphysis spans 2.9 cm, unchanged by my retrospective measurement. There is mild cortical thickening and smooth periosteal reaction but no pathologic fracture. Stable appearance of expansile left sixth rib lesion. Lateral fusion and corpectomy at T7-T8. Similar slight T12 compression  deformity. No definite new lytic lesions. There is heterotopic calcification about both hips. Enthesopathic change at the ischial tuberosities. 5.4 cm ovoid density with adjacent triangular densities correspond to bladder stones on prior CT. A suprapubic catheter is faintly visualized. Diffuse degenerative change in the spine. IMPRESSION: 1. Lytic lesions in the right proximal femoral diaphysis and left mid femoral diaphysis are stable from prior exam. Stable expansile left sixth rib lesion. 2. No evidence of new lytic lesion. 3. Large bladder stones, as seen on prior CT. Electronically Signed   By: Keith Rake M.D.   On: 01/09/2022 15:53    ASSESSMENT & PLAN ELL TISO 66 y.o. male with medical history significant for  IgG Lambda Multiple Myeloma who presents for a follow up visit.  After review of the labs, discussion with the patient, and reviewed the imaging his findings are most consistent with a relapsed multiple myeloma.The patient has had excellent success before with treatment of his myeloma with Velcade, Revlimid, and dexamethasone.  Transition to daratumumab therapy and was on a once monthly treatments but unfortunately developed progression of disease.  We have transitioned to Kyprolis/Dex.  # IgG Lambda Multiple Myeloma, Relapsed (ISS Stage II) --findings are most consistent with relapsed multiple myeloma. Patient previously successfully treated with Velcade/Rev/Dex  and Daratumumab/Velcade/Dex. On 07/02/2020 he transitioned to monthly daratumumab alone.  --due to to rise in M protein, switched to Kyprolis and Dexamethasone on 01/06/2022 --Most recent SPEP/IFE and sFLC from 01/12/2022 showed improving M protein measuring 1.1. K 5.3, L 38.4 and Ratio 0.14 Plan: --labs today were reviewed and adequate for treatment.  --patient will proceed with Cycle 1 Day 15 Kyprolis today.  --return for weekly Kyprolis and clinic visit  in 2 weeks.   #History of DVT --He had placement of IVC  filter, remains on Xarelto. --Due to poor mobility, and lack of bleeding complications, I recommend he remain on Xarelto indefinitely. --caution if Plt count were to drop <50  # Supportive Care -- provided patient with an albuterol inhaler (for use with daratumumab) --acyclovir 499m BID for VZV prophylaxis --zofran [id number] PRN for nausea/vomiting  --Zometa can be considered after dental clearance, though with his prior episode of osteonecrosis the risk may outweigh the benefits. --patient recieved pretreatment RBC phenotype  No orders of the defined types were placed in this encounter.   All questions were answered. The patient knows to call the clinic with any problems, questions or concerns.  I have spent a total of 25 minutes minutes of face-to-face and non-face-to-face time, preparing to see the patient,  performing a medically appropriate examination, counseling and educating the patient, communicating with other health care professionals, documenting clinical information in the electronic health record,  and care coordination.   IrLincoln BrighamPA-C Department of Hematology/Oncology [facility] Day Surgeryt WeCrawford Memorial Hospitalhone: [phone]/30/2023 1:10 PM   Literature Support:  BrDarrin NipperPetrucci MT, GaLas LomasBaIpavaMuMillingtonOfHamlinSpada S, BePiney PointPonticelli E, GaPlumCavo M, Di Toritto TC, DiShela Nevin, Montefusco V, Palumbo A, Boccadoro M, Larocca A. Once-weekly versus twice-weekly carfilzomib in patients with newly diagnosed multiple myeloma: a pooled analysis of two phase I/II studies. Haematologica. 2019 Aug;104(8):1640-1647.  --Once-weekly 70 mg/m2 carfilzomib as induction and maintenance therapy for newly diagnosed multiple myeloma patients was as safe and effective as twice-weekly 36 mg/m2 carfilzomib and provided a more convenient schedule.

## 2022-01-25 LAB — UPEP/UIFE/LIGHT CHAINS/TP, 24-HR UR
% BETA, Urine: 23.6 %
ALPHA 1 URINE: 3.7 %
Albumin, U: 44.8 %
Alpha 2, Urine: 11.5 %
Free Kappa Lt Chains,Ur: 3.97 mg/L (ref 1.17–86.46)
Free Kappa/Lambda Ratio: >5.75 (ref 1.83–14.26)
Free Lambda Lt Chains,Ur: <0.69 mg/L (ref 0.27–15.21)
GAMMA GLOBULIN URINE: 16.4 %
M-SPIKE %, Urine: 8.2 % — ABNORMAL HIGH
M-Spike, Mg/24 Hr: 96 mg/24 hr — ABNORMAL HIGH
Total Protein, Urine-Ur/day: 1174 mg/24 hr — ABNORMAL HIGH (ref 30–150)
Total Protein, Urine: 60.2 mg/dL
Total Volume: 1950

## 2022-01-27 ENCOUNTER — Inpatient Hospital Stay: Payer: Medicare (Managed Care)

## 2022-01-27 ENCOUNTER — Other Ambulatory Visit: Payer: Self-pay | Admitting: Hematology and Oncology

## 2022-01-27 ENCOUNTER — Inpatient Hospital Stay: Payer: Medicare (Managed Care) | Attending: Physician Assistant

## 2022-01-27 ENCOUNTER — Other Ambulatory Visit: Payer: Self-pay

## 2022-01-27 ENCOUNTER — Other Ambulatory Visit: Payer: Medicare (Managed Care)

## 2022-01-27 VITALS — BP 124/62 | HR 98 | Temp 98.6°F | Resp 18

## 2022-01-27 DIAGNOSIS — Z5111 Encounter for antineoplastic chemotherapy: Secondary | ICD-10-CM | POA: Diagnosis not present

## 2022-01-27 DIAGNOSIS — Z7901 Long term (current) use of anticoagulants: Secondary | ICD-10-CM | POA: Diagnosis not present

## 2022-01-27 DIAGNOSIS — C9002 Multiple myeloma in relapse: Secondary | ICD-10-CM

## 2022-01-27 DIAGNOSIS — Z86718 Personal history of other venous thrombosis and embolism: Secondary | ICD-10-CM | POA: Diagnosis not present

## 2022-01-27 DIAGNOSIS — Z7189 Other specified counseling: Secondary | ICD-10-CM

## 2022-01-27 LAB — CBC WITH DIFFERENTIAL (CANCER CENTER ONLY)
Abs Immature Granulocytes: 0.02 10*3/uL (ref 0.00–0.07)
Basophils Absolute: 0 10*3/uL (ref 0.0–0.1)
Basophils Relative: 0 %
Eosinophils Absolute: 0.2 10*3/uL (ref 0.0–0.5)
Eosinophils Relative: 3 %
HCT: 32 % — ABNORMAL LOW (ref 39.0–52.0)
Hemoglobin: 11.6 g/dL — ABNORMAL LOW (ref 13.0–17.0)
Immature Granulocytes: 0 %
Lymphocytes Relative: 14 %
Lymphs Abs: 1 10*3/uL (ref 0.7–4.0)
MCH: 30.8 pg (ref 26.0–34.0)
MCHC: 36.3 g/dL — ABNORMAL HIGH (ref 30.0–36.0)
MCV: 84.9 fL (ref 80.0–100.0)
Monocytes Absolute: 0.7 10*3/uL (ref 0.1–1.0)
Monocytes Relative: 10 %
Neutro Abs: 4.9 10*3/uL (ref 1.7–7.7)
Neutrophils Relative %: 73 %
Platelet Count: 220 10*3/uL (ref 150–400)
RBC: 3.77 MIL/uL — ABNORMAL LOW (ref 4.22–5.81)
RDW: 16.6 % — ABNORMAL HIGH (ref 11.5–15.5)
WBC Count: 6.8 10*3/uL (ref 4.0–10.5)
nRBC: 0 % (ref 0.0–0.2)

## 2022-01-27 LAB — CMP (CANCER CENTER ONLY)
ALT: 33 U/L (ref 0–44)
AST: 15 U/L (ref 15–41)
Albumin: 3.8 g/dL (ref 3.5–5.0)
Alkaline Phosphatase: 108 U/L (ref 38–126)
Anion gap: 7 (ref 5–15)
BUN: 15 mg/dL (ref 8–23)
CO2: 27 mmol/L (ref 22–32)
Calcium: 9.4 mg/dL (ref 8.9–10.3)
Chloride: 108 mmol/L (ref 98–111)
Creatinine: 0.9 mg/dL (ref 0.61–1.24)
GFR, Estimated: >60 mL/min (ref >60)
Glucose, Bld: 120 mg/dL — ABNORMAL HIGH (ref 70–99)
Potassium: 3.8 mmol/L (ref 3.5–5.1)
Sodium: 142 mmol/L (ref 135–145)
Total Bilirubin: 0.5 mg/dL (ref 0.3–1.2)
Total Protein: 6.8 g/dL (ref 6.5–8.1)

## 2022-01-27 LAB — LACTATE DEHYDROGENASE: LDH: 95 U/L — ABNORMAL LOW (ref 98–192)

## 2022-01-27 MED ORDER — SODIUM CHLORIDE 0.9 % IV SOLN
40.0000 mg | Freq: Once | INTRAVENOUS | Status: AC
  Administered 2022-01-27: 40 mg via INTRAVENOUS
  Filled 2022-01-27: qty 4

## 2022-01-27 MED ORDER — DEXTROSE 5 % IV SOLN
70.0000 mg/m2 | Freq: Once | INTRAVENOUS | Status: AC
  Administered 2022-01-27: 150 mg via INTRAVENOUS
  Filled 2022-01-27: qty 60

## 2022-01-27 MED ORDER — SODIUM CHLORIDE 0.9 % IV SOLN: Freq: Once | INTRAVENOUS | Status: AC

## 2022-01-27 MED ORDER — SODIUM CHLORIDE 0.9 % IV SOLN: Freq: Once | INTRAVENOUS | Status: DC

## 2022-01-27 NOTE — Patient Instructions (Signed)
[facility]   Discharge Instructions: Thank you for choosing [facility] to provide your oncology and hematology care.   If you have a lab appointment with the [facility], please go directly to the [facility] and check in at the registration area.   Wear comfortable clothing and clothing appropriate for easy access to any Portacath or PICC line.   We strive to give you quality time with your provider. You may need to reschedule your appointment if you arrive late (15 or more minutes).  Arriving late affects you and other patients whose appointments are after yours.  Also, if you miss three or more appointments without notifying the office, you may be dismissed from the clinic at the provider's discretion.      For prescription refill requests, have your pharmacy contact our office and allow 72 hours for refills to be completed.    Today you received the following chemotherapy and/or immunotherapy agents: carfilzomib      To help prevent nausea and vomiting after your treatment, we encourage you to take your nausea medication as directed.  BELOW ARE SYMPTOMS THAT SHOULD BE REPORTED IMMEDIATELY: *FEVER GREATER THAN 100.4 F (38 C) OR HIGHER *CHILLS OR SWEATING *NAUSEA AND VOMITING THAT IS NOT CONTROLLED WITH YOUR NAUSEA MEDICATION *UNUSUAL SHORTNESS OF BREATH *UNUSUAL BRUISING OR BLEEDING *URINARY PROBLEMS (pain or burning when urinating, or frequent urination) *BOWEL PROBLEMS (unusual diarrhea, constipation, pain near the anus) TENDERNESS IN MOUTH AND THROAT WITH OR WITHOUT PRESENCE OF ULCERS (sore throat, sores in mouth, or a toothache) UNUSUAL RASH, SWELLING OR PAIN  UNUSUAL VAGINAL DISCHARGE OR ITCHING   Items with * indicate a potential emergency and should be followed up as soon as possible or go to the Emergency Department if any problems should occur.  Please show the CHEMOTHERAPY ALERT CARD or IMMUNOTHERAPY ALERT CARD at check-in  to the Emergency Department and triage nurse.  Should you have questions after your visit or need to cancel or reschedule your appointment, please contact [facility]  Dept: [phone]  and follow the prompts.  Office hours are 8:00 a.m. to 4:30 p.m. Monday - Friday. Please note that voicemails left after 4:00 p.m. may not be returned until the following business day.  We are closed weekends and major holidays. You have access to a nurse at all times for urgent questions. Please call the main number to the clinic Dept: [phone] and follow the prompts.   For any non-urgent questions, you may also contact your provider using MyChart. We now offer e-Visits for anyone 43 and older to request care online for non-urgent symptoms. For details visit [email or web address].   Also download the MyChart app! Go to the app store, search "MyChart", open the app, select [facility], and log in with your MyChart username and password.  Masks are optional in the cancer centers. If you would like for your care team to wear a mask while they are taking care of you, please let them know. For doctor visits, patients may have with them one support person who is at least 68 years old. At this time, visitors are not allowed in the infusion area.

## 2022-02-02 ENCOUNTER — Ambulatory Visit: Payer: Medicare (Managed Care) | Admitting: Orthopedic Surgery

## 2022-02-03 ENCOUNTER — Inpatient Hospital Stay: Payer: Medicare (Managed Care)

## 2022-02-03 ENCOUNTER — Inpatient Hospital Stay: Payer: Medicare (Managed Care) | Admitting: Physician Assistant

## 2022-02-06 ENCOUNTER — Ambulatory Visit: Payer: Self-pay

## 2022-02-06 ENCOUNTER — Encounter: Payer: Self-pay | Admitting: Orthopedic Surgery

## 2022-02-06 ENCOUNTER — Ambulatory Visit (INDEPENDENT_AMBULATORY_CARE_PROVIDER_SITE_OTHER): Payer: Medicare (Managed Care) | Admitting: Orthopedic Surgery

## 2022-02-06 DIAGNOSIS — M899 Disorder of bone, unspecified: Secondary | ICD-10-CM | POA: Diagnosis not present

## 2022-02-06 DIAGNOSIS — M898X5 Other specified disorders of bone, thigh: Secondary | ICD-10-CM

## 2022-02-06 NOTE — Progress Notes (Signed)
Office Visit Note   Patient: [name]           Date of Birth: September 19, 1955           MRN: [id number] Visit Date: 02/06/2022              Requested by: [name], MD [address] 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  Patient recently has had CT scans of both femurs on June 19.  Assessment & Plan: Visit Diagnoses:  1. Lytic bone lesion of right femur   2. Lytic bone lesion of left femur     Plan: Patient continues to show no interval change in the lytic lesions.  We will follow-up in 6 months and obtain 2 view radiographs of both hips.  Follow-Up Instructions: Return in about 6 months (around 08/09/2022).   Ortho Exam  Patient is alert, oriented, no adenopathy, well-dressed, normal affect, normal respiratory effort. Patient is currently in [facility] boots there is no ulcers on the foot or ankles bilaterally.  Review of his CT scan shows no change in the lytic lesions.  Imaging: No results found. No images are attached to the encounter.  Labs: Lab Results  Component Value Date   HGBA1C 6.0 (H) 07/22/2014   ESRSEDRATE 73 (H) 10/21/2013   ESRSEDRATE 55 (H) 07/23/2013   ESRSEDRATE 95 (H) 07/13/2010   CRP 1.0 (H) 07/14/2010   LABURIC 8.2 (H) 07/21/2014   LABURIC 5.1 09/02/2010   REPTSTATUS 01/25/2020 FINAL 01/21/2020   CULT (A) 01/21/2020    >=100,000 COLONIES/mL PROTEUS MIRABILIS 20,000 COLONIES/mL STAPHYLOCOCCUS AUREUS    LABORGA PROTEUS MIRABILIS (A) 01/21/2020   LABORGA STAPHYLOCOCCUS AUREUS (A) 01/21/2020     Lab Results  Component Value Date   ALBUMIN 3.8 01/27/2022   ALBUMIN 3.6 01/20/2022   ALBUMIN 3.6 01/12/2022    Lab Results  Component Value Date   MG 1.8 01/21/2020   MG 1.5 07/21/2014   MG 1.3 (L)  02/13/2013   Lab Results  Component Value Date   VD25OH 59 04/08/2013   VD25OH 58 01/16/2013   VD25OH 63 11/15/2012    No results found for: "PREALBUMIN"    Latest Ref Rng & Units 01/27/2022    1:21 PM 01/20/2022   10:04 AM 01/12/2022    8:22 AM  CBC EXTENDED  WBC 4.0 - 10.5 K/uL 6.8  6.0  8.0   RBC 4.22 - 5.81 MIL/uL 3.77  3.64  3.76   Hemoglobin 13.0 - 17.0 g/dL 11.6  11.2  11.5   HCT 39.0 - 52.0 % 32.0  30.5  31.6   Platelets 150 - 400 K/uL 220  185  242   NEUT# 1.7 - 7.7 K/uL 4.9  4.4  5.6   Lymph# 0.7 - 4.0 K/uL 1.0  0.8  1.3      There is no height or weight on file to calculate BMI.  Orders:  No orders of the defined types were placed in this encounter.  No orders of the defined types were placed in this encounter.    Procedures: No procedures performed  Clinical Data: No additional findings.  ROS:  All other systems negative, except as noted in the HPI. Review of Systems  Objective: Vital Signs: There were no vitals taken for this visit.  Specialty Comments:  No specialty comments available.  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[facility]) 07/21/2014   Bilateral hydronephrosis 07/21/2014   Acute kidney failure ([facility]) 07/20/2014   Abdominal pain 07/20/2014   AKI (acute kidney injury) ([facility]) 07/20/2014   Hematuria 02/12/2013   Shock ([facility]) 02/12/2013   Adrenal insufficiency ([facility]) 02/12/2013   Hypoalbuminemia 11/15/2012   Anemia, unspecified 09/18/2012   Unspecified vitamin D deficiency 09/18/2012   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[place] 09/18/2012   Iron deficiency anemia 09/18/2012   Diverticulitis of large intestine with perforation 07/26/2011   Multiple myeloma in relapse ([facility]) 10/14/2010   Neurogenic bowel 10/14/2010   Paraplegia ([place]) 07/13/2010   Neurogenic bladder 07/11/2010   BACK PAIN, LUMBAR, WITH RADICULOPATHY 07/07/2010   DISEQUILIBRIUM 07/07/2010   Abdominal pain, generalized 07/07/2010   OBESITY, NOS 09/20/2006   Past Medical History:  Diagnosis Date   Adrenal insufficiency (HCC)    on chronic dexamethasone   Anemia    Cancer (HCC)    Coagulopathy (HCC)    on xeralto/ s/p DVT while on coumadin,  IVC in place   Diabetes mellitus without complication ([facility])    type 2   Gross hematuria 7/14   post foley cath procedure   History of blood transfusion 7/14   Multiple myeloma    thoracic T8 with paraplegia s/p resection- on chemo at visit 10/13/10   Multiple myeloma    Multiple myeloma without mention of remission    Neurogenic bladder    Neurogenic bowel    Paraplegia (HCC)    Partial small bowel obstruction ([facility]) during dec 2011 admission    Family History  Problem Relation Age of Onset   Ovarian cancer Mother    Diabetes Father     Past Surgical History:  Procedure Laterality Date   COLONOSCOPY WITH PROPOFOL N/A 04/12/2017   Procedure: COLONOSCOPY WITH PROPOFOL;  Surgeon: Irene Shipper, MD;  Location: [facility];  Service: Endoscopy;  Laterality: N/A;   COLONOSCOPY WITH PROPOFOL N/A 04/19/2017   Procedure: COLONOSCOPY WITH PROPOFOL;  Surgeon: Yetta Flock, MD;  Location: [facility];  Service: Gastroenterology;  Laterality: N/A;   COLOSTOMY  07/20/2011   Procedure: COLOSTOMY;  Surgeon: Judieth Keens, DO;  Location: [facility];  Service: General;;   COLOSTOMY REVISION  07/20/2011   Procedure: COLON RESECTION SIGMOID;  Surgeon: Judieth Keens, DO;  Location: [facility];  Service: General;;   CYSTOSCOPY N/A 04/04/2013   Procedure: CYSTOSCOPY WITH LITHALOPAXY;   Surgeon: Alexis Frock, MD;  Location: [facility];  Service: Urology;  Laterality: N/A;   INSERTION OF SUPRAPUBIC CATHETER N/A 04/04/2013   Procedure: INSERTION OF SUPRAPUBIC CATHETER;  Surgeon: Alexis Frock, MD;  Location: [facility];  Service: Urology;  Laterality: N/A;   LAPAROTOMY  07/20/2011   Procedure: EXPLORATORY LAPAROTOMY;  Surgeon: Judieth Keens, DO;  Location: [facility];  Service: General;  Laterality: N/A;   myeloma thoracic T8 with parpaplegia s/p thoracotomy and thoracic  T7-9 cage placement on Dec 26th 2011  07/18/10   Social History   Occupational History   Not on file  Tobacco Use   Smoking status: Never   Smokeless tobacco: Never  Vaping Use   Vaping Use: Never used  Substance and Sexual Activity   Alcohol use: No   Drug use: No   Sexual activity: Never

## 2022-02-10 ENCOUNTER — Other Ambulatory Visit: Payer: Self-pay | Admitting: *Deleted

## 2022-02-10 ENCOUNTER — Ambulatory Visit: Payer: Medicare (Managed Care)

## 2022-02-10 ENCOUNTER — Inpatient Hospital Stay: Payer: Medicare (Managed Care)

## 2022-02-10 ENCOUNTER — Other Ambulatory Visit: Payer: Self-pay

## 2022-02-10 ENCOUNTER — Other Ambulatory Visit: Payer: Self-pay | Admitting: Physician Assistant

## 2022-02-10 ENCOUNTER — Other Ambulatory Visit: Payer: Self-pay | Admitting: Hematology and Oncology

## 2022-02-10 ENCOUNTER — Other Ambulatory Visit: Payer: Medicare (Managed Care)

## 2022-02-10 VITALS — BP 122/80 | HR 78 | Resp 18

## 2022-02-10 DIAGNOSIS — Z7189 Other specified counseling: Secondary | ICD-10-CM

## 2022-02-10 DIAGNOSIS — Z5111 Encounter for antineoplastic chemotherapy: Secondary | ICD-10-CM | POA: Diagnosis not present

## 2022-02-10 DIAGNOSIS — C9002 Multiple myeloma in relapse: Secondary | ICD-10-CM

## 2022-02-10 LAB — CBC WITH DIFFERENTIAL (CANCER CENTER ONLY)
Abs Immature Granulocytes: 0.03 10*3/uL (ref 0.00–0.07)
Basophils Absolute: 0 10*3/uL (ref 0.0–0.1)
Basophils Relative: 1 %
Eosinophils Absolute: 0.3 10*3/uL (ref 0.0–0.5)
Eosinophils Relative: 4 %
HCT: 32.2 % — ABNORMAL LOW (ref 39.0–52.0)
Hemoglobin: 11.5 g/dL — ABNORMAL LOW (ref 13.0–17.0)
Immature Granulocytes: 0 %
Lymphocytes Relative: 15 %
Lymphs Abs: 1.1 10*3/uL (ref 0.7–4.0)
MCH: 30.5 pg (ref 26.0–34.0)
MCHC: 35.7 g/dL (ref 30.0–36.0)
MCV: 85.4 fL (ref 80.0–100.0)
Monocytes Absolute: 0.7 10*3/uL (ref 0.1–1.0)
Monocytes Relative: 9 %
Neutro Abs: 5.2 10*3/uL (ref 1.7–7.7)
Neutrophils Relative %: 71 %
Platelet Count: 449 10*3/uL — ABNORMAL HIGH (ref 150–400)
RBC: 3.77 MIL/uL — ABNORMAL LOW (ref 4.22–5.81)
RDW: 16.4 % — ABNORMAL HIGH (ref 11.5–15.5)
WBC Count: 7.4 10*3/uL (ref 4.0–10.5)
nRBC: 0 % (ref 0.0–0.2)

## 2022-02-10 LAB — CMP (CANCER CENTER ONLY)
ALT: 29 U/L (ref 0–44)
AST: 16 U/L (ref 15–41)
Albumin: 3.9 g/dL (ref 3.5–5.0)
Alkaline Phosphatase: 110 U/L (ref 38–126)
Anion gap: 8 (ref 5–15)
BUN: 11 mg/dL (ref 8–23)
CO2: 28 mmol/L (ref 22–32)
Calcium: 9.4 mg/dL (ref 8.9–10.3)
Chloride: 107 mmol/L (ref 98–111)
Creatinine: 0.82 mg/dL (ref 0.61–1.24)
GFR, Estimated: >60 mL/min (ref >60)
Glucose, Bld: 113 mg/dL — ABNORMAL HIGH (ref 70–99)
Potassium: 4.2 mmol/L (ref 3.5–5.1)
Sodium: 143 mmol/L (ref 135–145)
Total Bilirubin: 0.5 mg/dL (ref 0.3–1.2)
Total Protein: 6 g/dL — ABNORMAL LOW (ref 6.5–8.1)

## 2022-02-10 MED ORDER — SODIUM CHLORIDE 0.9 % IV SOLN: Freq: Once | INTRAVENOUS | Status: AC

## 2022-02-10 MED ORDER — SODIUM CHLORIDE 0.9 % IV SOLN
40.0000 mg | Freq: Once | INTRAVENOUS | Status: AC
  Administered 2022-02-10: 40 mg via INTRAVENOUS
  Filled 2022-02-10: qty 4

## 2022-02-10 MED ORDER — DEXTROSE 5 % IV SOLN
70.0000 mg/m2 | Freq: Once | INTRAVENOUS | Status: AC
  Administered 2022-02-10: 150 mg via INTRAVENOUS
  Filled 2022-02-10: qty 60

## 2022-02-10 NOTE — Patient Instructions (Signed)
[facility]  Discharge Instructions: Thank you for choosing [facility] to provide your oncology and hematology care.   If you have a lab appointment with the [facility], please go directly to the [facility] and check in at the registration area.   Wear comfortable clothing and clothing appropriate for easy access to any Portacath or PICC line.   We strive to give you quality time with your provider. You may need to reschedule your appointment if you arrive late (15 or more minutes).  Arriving late affects you and other patients whose appointments are after yours.  Also, if you miss three or more appointments without notifying the office, you may be dismissed from the clinic at the provider's discretion.      For prescription refill requests, have your pharmacy contact our office and allow 72 hours for refills to be completed.    Today you received the following chemotherapy and/or immunotherapy agents kyprolis      To help prevent nausea and vomiting after your treatment, we encourage you to take your nausea medication as directed.  BELOW ARE SYMPTOMS THAT SHOULD BE REPORTED IMMEDIATELY: *FEVER GREATER THAN 100.4 F (38 C) OR HIGHER *CHILLS OR SWEATING *NAUSEA AND VOMITING THAT IS NOT CONTROLLED WITH YOUR NAUSEA MEDICATION *UNUSUAL SHORTNESS OF BREATH *UNUSUAL BRUISING OR BLEEDING *URINARY PROBLEMS (pain or burning when urinating, or frequent urination) *BOWEL PROBLEMS (unusual diarrhea, constipation, pain near the anus) TENDERNESS IN MOUTH AND THROAT WITH OR WITHOUT PRESENCE OF ULCERS (sore throat, sores in mouth, or a toothache) UNUSUAL RASH, SWELLING OR PAIN  UNUSUAL VAGINAL DISCHARGE OR ITCHING   Items with * indicate a potential emergency and should be followed up as soon as possible or go to the Emergency Department if any problems should occur.  Please show the CHEMOTHERAPY ALERT CARD or IMMUNOTHERAPY ALERT CARD at check-in to  the Emergency Department and triage nurse.  Should you have questions after your visit or need to cancel or reschedule your appointment, please contact [facility]  Dept: [phone]  and follow the prompts.  Office hours are 8:00 a.m. to 4:30 p.m. Monday - Friday. Please note that voicemails left after 4:00 p.m. may not be returned until the following business day.  We are closed weekends and major holidays. You have access to a nurse at all times for urgent questions. Please call the main number to the clinic Dept: [phone] and follow the prompts.   For any non-urgent questions, you may also contact your provider using MyChart. We now offer e-Visits for anyone 52 and older to request care online for non-urgent symptoms. For details visit [email or web address].   Also download the MyChart app! Go to the app store, search "MyChart", open the app, select [facility], and log in with your MyChart username and password.  Masks are optional in the cancer centers. If you would like for your care team to wear a mask while they are taking care of you, please let them know. For doctor visits, patients may have with them one support person who is at least 68 years old. At this time, visitors are not allowed in the infusion area.

## 2022-02-13 ENCOUNTER — Other Ambulatory Visit: Payer: Self-pay

## 2022-02-13 LAB — KAPPA/LAMBDA LIGHT CHAINS
Kappa free light chain: 6.3 mg/L (ref 3.3–19.4)
Kappa, lambda light chain ratio: 0.39 (ref 0.26–1.65)
Lambda free light chains: 16.1 mg/L (ref 5.7–26.3)

## 2022-02-15 LAB — MULTIPLE MYELOMA PANEL, SERUM
Albumin SerPl Elph-Mcnc: 3.1 g/dL (ref 2.9–4.4)
Albumin/Glob SerPl: 1.1 (ref 0.7–1.7)
Alpha 1: 0.3 g/dL (ref 0.0–0.4)
Alpha2 Glob SerPl Elph-Mcnc: 0.9 g/dL (ref 0.4–1.0)
B-Globulin SerPl Elph-Mcnc: 1 g/dL (ref 0.7–1.3)
Gamma Glob SerPl Elph-Mcnc: 0.7 g/dL (ref 0.4–1.8)
Globulin, Total: 2.9 g/dL (ref 2.2–3.9)
IgA: 13 mg/dL — ABNORMAL LOW (ref 61–437)
IgG (Immunoglobin G), Serum: 866 mg/dL (ref 603–1613)
IgM (Immunoglobulin M), Srm: 6 mg/dL — ABNORMAL LOW (ref 20–172)
M Protein SerPl Elph-Mcnc: 0.5 g/dL — ABNORMAL HIGH
Total Protein ELP: 6 g/dL (ref 6.0–8.5)

## 2022-02-17 ENCOUNTER — Inpatient Hospital Stay: Payer: Medicare (Managed Care)

## 2022-02-17 ENCOUNTER — Inpatient Hospital Stay (HOSPITAL_BASED_OUTPATIENT_CLINIC_OR_DEPARTMENT_OTHER): Payer: Medicare (Managed Care) | Admitting: Hematology and Oncology

## 2022-02-17 ENCOUNTER — Inpatient Hospital Stay: Payer: Medicare (Managed Care) | Admitting: Hematology and Oncology

## 2022-02-17 ENCOUNTER — Other Ambulatory Visit: Payer: Self-pay

## 2022-02-17 VITALS — BP 129/67 | HR 81 | Temp 97.0°F | Resp 16 | Wt 250.3 lb

## 2022-02-17 DIAGNOSIS — C9002 Multiple myeloma in relapse: Secondary | ICD-10-CM

## 2022-02-17 DIAGNOSIS — G8220 Paraplegia, unspecified: Secondary | ICD-10-CM

## 2022-02-17 DIAGNOSIS — Z7189 Other specified counseling: Secondary | ICD-10-CM

## 2022-02-17 DIAGNOSIS — Z5111 Encounter for antineoplastic chemotherapy: Secondary | ICD-10-CM

## 2022-02-17 LAB — CMP (CANCER CENTER ONLY)
ALT: 25 U/L (ref 0–44)
AST: 15 U/L (ref 15–41)
Albumin: 4.1 g/dL (ref 3.5–5.0)
Alkaline Phosphatase: 106 U/L (ref 38–126)
Anion gap: 6 (ref 5–15)
BUN: 18 mg/dL (ref 8–23)
CO2: 25 mmol/L (ref 22–32)
Calcium: 9.5 mg/dL (ref 8.9–10.3)
Chloride: 109 mmol/L (ref 98–111)
Creatinine: 0.77 mg/dL (ref 0.61–1.24)
GFR, Estimated: >60 mL/min (ref >60)
Glucose, Bld: 108 mg/dL — ABNORMAL HIGH (ref 70–99)
Potassium: 4.6 mmol/L (ref 3.5–5.1)
Sodium: 140 mmol/L (ref 135–145)
Total Bilirubin: 0.6 mg/dL (ref 0.3–1.2)
Total Protein: 6.9 g/dL (ref 6.5–8.1)

## 2022-02-17 LAB — CBC WITH DIFFERENTIAL (CANCER CENTER ONLY)
Abs Immature Granulocytes: 0.03 10*3/uL (ref 0.00–0.07)
Basophils Absolute: 0 10*3/uL (ref 0.0–0.1)
Basophils Relative: 0 %
Eosinophils Absolute: 0.2 10*3/uL (ref 0.0–0.5)
Eosinophils Relative: 3 %
HCT: 33.6 % — ABNORMAL LOW (ref 39.0–52.0)
Hemoglobin: 12 g/dL — ABNORMAL LOW (ref 13.0–17.0)
Immature Granulocytes: 0 %
Lymphocytes Relative: 16 %
Lymphs Abs: 1.1 10*3/uL (ref 0.7–4.0)
MCH: 30.2 pg (ref 26.0–34.0)
MCHC: 35.7 g/dL (ref 30.0–36.0)
MCV: 84.4 fL (ref 80.0–100.0)
Monocytes Absolute: 0.7 10*3/uL (ref 0.1–1.0)
Monocytes Relative: 10 %
Neutro Abs: 5 10*3/uL (ref 1.7–7.7)
Neutrophils Relative %: 71 %
Platelet Count: 209 10*3/uL (ref 150–400)
RBC: 3.98 MIL/uL — ABNORMAL LOW (ref 4.22–5.81)
RDW: 16.1 % — ABNORMAL HIGH (ref 11.5–15.5)
WBC Count: 7 10*3/uL (ref 4.0–10.5)
nRBC: 0.3 % — ABNORMAL HIGH (ref 0.0–0.2)

## 2022-02-17 LAB — LACTATE DEHYDROGENASE: LDH: 98 U/L (ref 98–192)

## 2022-02-17 LAB — URIC ACID: Uric Acid, Serum: 6.4 mg/dL (ref 3.7–8.6)

## 2022-02-17 MED ORDER — DEXTROSE 5 % IV SOLN
70.0000 mg/m2 | Freq: Once | INTRAVENOUS | Status: AC
  Administered 2022-02-17: 150 mg via INTRAVENOUS
  Filled 2022-02-17: qty 60

## 2022-02-17 MED ORDER — SODIUM CHLORIDE 0.9 % IV SOLN
40.0000 mg | Freq: Once | INTRAVENOUS | Status: AC
  Administered 2022-02-17: 40 mg via INTRAVENOUS
  Filled 2022-02-17: qty 4

## 2022-02-17 MED ORDER — SODIUM CHLORIDE 0.9 % IV SOLN: Freq: Once | INTRAVENOUS | Status: AC

## 2022-02-17 NOTE — Patient Instructions (Signed)
[facility]  Discharge Instructions: Thank you for choosing [facility] to provide your oncology and hematology care.   If you have a lab appointment with the [facility], please go directly to the [facility] and check in at the registration area.   Wear comfortable clothing and clothing appropriate for easy access to any Portacath or PICC line.   We strive to give you quality time with your provider. You may need to reschedule your appointment if you arrive late (15 or more minutes).  Arriving late affects you and other patients whose appointments are after yours.  Also, if you miss three or more appointments without notifying the office, you may be dismissed from the clinic at the provider's discretion.      For prescription refill requests, have your pharmacy contact our office and allow 72 hours for refills to be completed.    Today you received the following chemotherapy and/or immunotherapy agents kyprolis      To help prevent nausea and vomiting after your treatment, we encourage you to take your nausea medication as directed.  BELOW ARE SYMPTOMS THAT SHOULD BE REPORTED IMMEDIATELY: *FEVER GREATER THAN 100.4 F (38 C) OR HIGHER *CHILLS OR SWEATING *NAUSEA AND VOMITING THAT IS NOT CONTROLLED WITH YOUR NAUSEA MEDICATION *UNUSUAL SHORTNESS OF BREATH *UNUSUAL BRUISING OR BLEEDING *URINARY PROBLEMS (pain or burning when urinating, or frequent urination) *BOWEL PROBLEMS (unusual diarrhea, constipation, pain near the anus) TENDERNESS IN MOUTH AND THROAT WITH OR WITHOUT PRESENCE OF ULCERS (sore throat, sores in mouth, or a toothache) UNUSUAL RASH, SWELLING OR PAIN  UNUSUAL VAGINAL DISCHARGE OR ITCHING   Items with * indicate a potential emergency and should be followed up as soon as possible or go to the Emergency Department if any problems should occur.  Please show the CHEMOTHERAPY ALERT CARD or IMMUNOTHERAPY ALERT CARD at check-in to  the Emergency Department and triage nurse.  Should you have questions after your visit or need to cancel or reschedule your appointment, please contact [facility]  Dept: [phone]  and follow the prompts.  Office hours are 8:00 a.m. to 4:30 p.m. Monday - Friday. Please note that voicemails left after 4:00 p.m. may not be returned until the following business day.  We are closed weekends and major holidays. You have access to a nurse at all times for urgent questions. Please call the main number to the clinic Dept: [phone] and follow the prompts.   For any non-urgent questions, you may also contact your provider using MyChart. We now offer e-Visits for anyone 31 and older to request care online for non-urgent symptoms. For details visit [email or web address].   Also download the MyChart app! Go to the app store, search "MyChart", open the app, select [facility], and log in with your MyChart username and password.  Masks are optional in the cancer centers. If you would like for your care team to wear a mask while they are taking care of you, please let them know. For doctor visits, patients may have with them one support person who is at least 68 years old. At this time, visitors are not allowed in the infusion area.

## 2022-02-18 ENCOUNTER — Encounter: Payer: Self-pay | Admitting: Hematology and Oncology

## 2022-02-18 NOTE — Progress Notes (Signed)
[facility] [phone]   [phone]  PROGRESS NOTE  Patient Care Team: [name], MD as PCP - General (Family Medicine) [name], MD as Consulting Physician (Physical Medicine and Rehabilitation) Donia Guiles Lavon Paganini, PA-C as Physician Assistant (Physical Medicine and Rehabilitation) Orson Slick, MD as Consulting Physician (Hematology and Oncology)  Hematological/Oncological History # IgG Lambda Multiple Myeloma, Relapsed (ISS Stage II) 1) 06/2010: initial diagnosis of Multiple Myeloma after T8 compression fracture. Treated with Velcade/Revlimid/Dexamethasone and achieved a complete remission 2) Velcade was discontinued in September 2012 and that Revlimid and Decadron were discontinued in March 2013. 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 4) Followed by Dr. Alvy Bimler, last clinic visit 10/09/2019. At that time there was concern for relapse of his multiple myeloma.  5) Patient requested transfer to different provider after misunderstanding regarding imaging studies 6) 12/17/2019: transfer care to Dr. Lorenso Courier  7) 01/09/2020: Cycle 1 Day 1 of Dara/Velcade/Dex 8) 01/21/2020: presented as urgent visit for diarrhea and dehydration. Holding chemotherapy scheduled for 01/23/2020. 9) 01/30/2020: Resume dara/velcade/dex after resolution of diarrhea.  10) 02/13/2020: restaging labs show M protein 0.8, Kappa 4.5, lamba 17.2, ratio 0.26, urine M protein 53 (7.1%). All MM labs indicate improvement.  11) 03/10/2020: Cycle 4 Day 1 of Dara/Velcade/Dex. Transition to q 3 week daratumumab.  12) 06/04/2020:  Cycle 8 Day 1 of Dara/Velcade/Dex 13) 06/25/2020:  Cycle 9 Day 1 of Dara/Velcade/Dex 14) 07/22/2020: Cycle 10 Day 1 of  Dara//Dex 15) 08/18/2020: Cycle 11 Day 1 of Dara//Dex 16) 09/23/2020: Cycle 12 Day 1 of Dara//Dex 17) 10/22/2020: Cycle 13 Day 1 of Dara/Dex   18) 11/19/2020: Cycle 14 Day 1 of Dara/Dex  19) 12/17/2020: Cycle 15 Day 1 of Dara/Dex  20) 01/17/2021: Cycle 16 Day 1 of Dara/Dex  21) 02/11/2021: Cycle 17 Day 1 of Dara/Dex 22) 03/10/2021: Cycle 18 Day 1 of Dara/Dex  23) 04/08/2021: Cycle 19 Day 1 of Dara/Dex  24) 08/03/2021: Cycle 20 Day 1 of Dara/Dex (delayed due to scheduling error) 25) 09/02/2021: Cycle 21 Day 1 of Dara/Dex 26) 09/30/2021: Cycle 22 Day 1 of Dara/Dex 27) 10/28/2021: Cycle 23 Day 1 of Dara/Dex 28) 12/02/2021: Cycle 24 Day 1 of Dara/Dex 29) 12/30/2021: Cycle 25 Day 1 of Dara/Dex 30) 01/06/2022: Cycle 1 Day 1 of Kyprolis/Dex 31) 02/03/2022: Cycle 2 Day 1 of Kyprolis/Dex  Interval History:  [name] 66 y.o. male with medical history significant for  IgG Lambda Multiple Myeloma who presents for a follow up visit. The patient's last visit was on 01/20/2022.  He presents today to for cycle 2 day 15 of Kyprolis/Dex.   On exam today [name] reports he is tolerating the Kyprolis therapy well so far without any major side effects.  He is receiving the infusions and notes no changes in his energy level.  He denies any nausea, ming, or diarrhea.  His energy levels are quite good.  He is not having any bleeding or bruising.  He reports his urine remains clear.  He is able to do all of his daily activities without any difficulty.  He denies any numbness or tingling of his fingers or toes.  He has no other complaints.A full 10 point ROS is listed below.  MEDICAL HISTORY:  Past Medical History:  Diagnosis Date   Adrenal insufficiency ([place])    on chronic dexamethasone   Anemia    Cancer ([place])  Coagulopathy ([place])    on xeralto/ s/p DVT while on coumadin,  IVC in place   Diabetes mellitus without complication ([place])    type 2   Gross hematuria 7/14   post foley cath procedure   History of blood transfusion 7/14   Multiple myeloma    thoracic T8 with paraplegia s/p resection- on chemo at visit 10/13/10   Multiple myeloma     Multiple myeloma without mention of remission    Neurogenic bladder    Neurogenic bowel    Paraplegia ([place])    Partial small bowel obstruction ([place]) during dec 2011 admission    SURGICAL HISTORY: Past Surgical History:  Procedure Laterality Date   COLONOSCOPY WITH PROPOFOL N/A 04/12/2017   Procedure: COLONOSCOPY WITH PROPOFOL;  Surgeon: Irene Shipper, MD;  Location: [facility];  Service: Endoscopy;  Laterality: N/A;   COLONOSCOPY WITH PROPOFOL N/A 04/19/2017   Procedure: COLONOSCOPY WITH PROPOFOL;  Surgeon: Yetta Flock, MD;  Location: [facility];  Service: Gastroenterology;  Laterality: N/A;   COLOSTOMY  07/20/2011   Procedure: COLOSTOMY;  Surgeon: Judieth Keens, DO;  Location: [facility];  Service: General;;   COLOSTOMY REVISION  07/20/2011   Procedure: COLON RESECTION SIGMOID;  Surgeon: Judieth Keens, DO;  Location: [place];  Service: General;;   CYSTOSCOPY N/A 04/04/2013   Procedure: CYSTOSCOPY WITH LITHALOPAXY;  Surgeon: Alexis Frock, MD;  Location: [facility];  Service: Urology;  Laterality: N/A;   INSERTION OF SUPRAPUBIC CATHETER N/A 04/04/2013   Procedure: INSERTION OF SUPRAPUBIC CATHETER;  Surgeon: Alexis Frock, MD;  Location: [facility];  Service: Urology;  Laterality: N/A;   LAPAROTOMY  07/20/2011   Procedure: EXPLORATORY LAPAROTOMY;  Surgeon: Judieth Keens, DO;  Location: [facility];  Service: General;  Laterality: N/A;   myeloma thoracic T8 with parpaplegia s/p thoracotomy and thoracic T7-9 cage placement on Dec 26th 2011  07/18/10    SOCIAL HISTORY: Social History   Socioeconomic History   Marital status: Married    Spouse name: Not on file   Number of children: Not on file   Years of education: Not on file   Highest education level: Not on file  Occupational History   Not on file  Tobacco Use   Smoking status: Never   Smokeless tobacco: Never  Vaping Use   Vaping Use: Never used  Substance and Sexual Activity   Alcohol use: No   Drug use: No    Sexual activity: Never  Other Topics Concern   Not on file  Social History Narrative   Not on file   Social Determinants of Health   Financial Resource Strain: Not on file  Food Insecurity: Not on file  Transportation Needs: Not on file  Physical Activity: Not on file  Stress: Not on file  Social Connections: Not on file  Intimate Partner Violence: Not on file    FAMILY HISTORY: Family History  Problem Relation Age of Onset   Ovarian cancer Mother    Diabetes Father     ALLERGIES:  is allergic to feraheme [ferumoxytol].  MEDICATIONS:  Current Outpatient Medications  Medication Sig Dispense Refill   acyclovir (ZOVIRAX) 400 MG tablet Take 1 tablet (400 mg total) by mouth 2 (two) times daily. 60 tablet 2   atorvastatin (LIPITOR) 10 MG tablet Take 10 mg by mouth daily.     baclofen (LIORESAL) 20 MG tablet Take 20 mg by mouth 2 (two) times daily.     diazepam (DIASTAT ACUDIAL) 10 MG GEL SMARTSIG:By  Mouth     dorzolamide (TRUSOPT) 2 % ophthalmic solution 1 drop 2 (two) times daily.     iron polysaccharides (NU-IRON) 150 MG capsule Take 1 capsule (150 mg total) by mouth daily. 30 capsule 0   latanoprost (XALATAN) 0.005 % ophthalmic solution Place 1 drop into both eyes at bedtime.     ondansetron (ZOFRAN) 8 MG tablet Take 1 tablet (8 mg total) by mouth every 8 (eight) hours as needed for nausea or vomiting. 30 tablet 0   rivaroxaban (XARELTO) 10 MG TABS tablet Take 1 tablet (10 mg total) by mouth daily with supper. 30 tablet 9   Skin Protectants, Misc. (EUCERIN) cream Apply 1 application topically 2 (two) times daily as needed for dry skin.      sulfamethoxazole-trimethoprim (BACTRIM DS) 800-160 MG tablet Take 1 tablet by mouth 2 (two) times daily.     zinc oxide (BALMEX) 11.3 % CREA cream Apply 1 application topically 2 (two) times daily.     No current facility-administered medications for this visit.   Facility-Administered Medications Ordered in Other Visits  Medication Dose  Route Frequency Provider Last Rate Last Admin   sodium chloride flush (NS) 0.9 % injection 10 mL  10 mL Intravenous PRN Alvy Bimler, Ni, MD        REVIEW OF SYSTEMS:   Constitutional: ( - ) fevers, ( - )  chills , ( - ) night sweats Eyes: ( - ) blurriness of vision, ( - ) double vision, ( - ) watery eyes Ears, nose, mouth, throat, and face: ( - ) mucositis, ( - ) sore throat Respiratory: ( - ) cough, ( - ) dyspnea, ( - ) wheezes Cardiovascular: ( - ) palpitation, ( - ) chest discomfort, ( - ) lower extremity swelling Gastrointestinal:  ( - ) nausea, ( - ) heartburn, ( - ) change in bowel habits Skin: ( - ) abnormal skin rashes Lymphatics: ( - ) new lymphadenopathy, ( - ) easy bruising Neurological: ( - ) numbness, ( - ) tingling, ( - ) new weaknesses Behavioral/Psych: ( - ) mood change, ( - ) new changes  All other systems were reviewed with the patient and are negative.  PHYSICAL EXAMINATION: ECOG PERFORMANCE STATUS: paraplegic.   Vitals:   02/17/22 1139  BP: 129/67  Pulse: 81  Resp: 16  Temp: (!) 97 F (36.1 C)  SpO2: 100%      Filed Weights   02/17/22 1139  Weight: 250 lb 4.8 oz (113.5 kg)   GENERAL: well appearing middle aged [place] American male alert, no distress and comfortable SKIN: skin color, texture, turgor are normal, no rashes or significant lesions EYES: conjunctiva are pink and non-injected, sclera clear LUNGS: clear to auscultation and percussion with normal breathing effort HEART: regular rate & rhythm and no murmurs and no lower extremity edema Musculoskeletal: no cyanosis of digits and no clubbing  PSYCH: alert & oriented x 3, fluent speech NEURO: paraplegic, no use of LE bilaterally.   LABORATORY DATA:  I have reviewed the data as listed    Latest Ref Rng & Units 02/17/2022   10:51 AM 02/10/2022    2:10 PM 01/27/2022    1:21 PM  CBC  WBC 4.0 - 10.5 K/uL 7.0  7.4  6.8   Hemoglobin 13.0 - 17.0 g/dL 12.0  11.5  11.6   Hematocrit 39.0 - 52.0 % 33.6   32.2  32.0   Platelets 150 - 400 K/uL 209  449  220  Latest Ref Rng & Units 02/17/2022   10:51 AM 02/10/2022    2:10 PM 01/27/2022    1:21 PM  CMP  Glucose 70 - 99 mg/dL 108  113  120   BUN 8 - 23 mg/dL _0 Creatinine 0.61 - 1.24 mg/dL 0.77  0.82  0.90   Sodium 135 - 145 mmol/L 140  143  142   Potassium 3.5 - 5.1 mmol/L 4.6  4.2  3.8   Chloride 98 - 111 mmol/L 109  107  108   CO2 22 - 32 mmol/L _1 Calcium 8.9 - 10.3 mg/dL 9.5  9.4  9.4   Total Protein 6.5 - 8.1 g/dL 6.9  6.0  6.8   Total Bilirubin 0.3 - 1.2 mg/dL 0.6  0.5  0.5   Alkaline Phos 38 - 126 U/L 106  110  108   AST 15 - 41 U/L _2 ALT 0 - 44 U/L 25  29  33     Lab Results  Component Value Date   MPROTEIN 0.5 (H) 02/10/2022   MPROTEIN 1.1 (H) 01/12/2022   MPROTEIN 1.4 (H) 12/30/2021   Lab Results  Component Value Date   KPAFRELGTCHN 6.3 02/10/2022   KPAFRELGTCHN 5.3 01/12/2022   KPAFRELGTCHN 4.9 12/30/2021   LAMBDASER 16.1 02/10/2022   LAMBDASER 38.4 (H) 01/12/2022   LAMBDASER 81.6 (H) 12/30/2021   KAPLAMBRATIO 0.39 02/10/2022   KAPLAMBRATIO >5.75 01/20/2022   KAPLAMBRATIO 0.14 (L) 01/12/2022    RADIOGRAPHIC STUDIES: No results found.  ASSESSMENT & PLAN [name] 66 y.o. male with medical history significant for  IgG Lambda Multiple Myeloma who presents for a follow up visit.  After review of the labs, discussion with the patient, and reviewed the imaging his findings are most consistent with a relapsed multiple myeloma.The patient has had excellent success before with treatment of his myeloma with Velcade, Revlimid, and dexamethasone.  Transition to daratumumab therapy and was on a once monthly treatments but unfortunately developed progression of disease.  We have transitioned to Kyprolis/Dex.  # IgG Lambda Multiple Myeloma, Relapsed (ISS Stage II) --findings are most consistent with relapsed multiple myeloma. Patient previously successfully treated with Velcade/Rev/Dex  and Daratumumab/Velcade/Dex. On 07/02/2020 he transitioned to monthly daratumumab alone.  --due to to rise in M protein, switched to Kyprolis and Dexamethasone on 01/06/2022 --Most recent SPEP/IFE and sFLC from 01/12/2022 showed improving M protein measuring 1.1. K 5.3, L 38.4 and Ratio 0.14 Plan: --labs today were reviewed and adequate for treatment.  --Labs show white blood cell count 7.0, hemoglobin 12.0, MCV 84.4, and platelets of 209.  M protein dropped to 0.5 with normalization of kappa lambda ratio to 0.39 --patient will proceed with Cycle 2 Day 15 Kyprolis today.  --return for weekly Kyprolis and clinic visit  in 2 weeks.   #History of DVT --He had placement of IVC filter, remains on Xarelto. --Due to poor mobility, and lack of bleeding complications, I recommend he remain on Xarelto indefinitely. --caution if Plt count were to drop <50  # Supportive Care -- provided patient with an albuterol inhaler (for use with daratumumab) --acyclovir 434m BID for VZV prophylaxis --zofran [id number] PRN for nausea/vomiting  --Zometa can be considered after dental clearance, though with his prior episode of osteonecrosis the risk may outweigh the benefits. --patient recieved pretreatment RBC phenotype  No orders of the defined types were placed in this encounter.  All questions were answered. The patient knows to call the clinic with any problems, questions or concerns.  I have spent a total of 25 minutes minutes of face-to-face and non-face-to-face time, preparing to see the patient,  performing a medically appropriate examination, counseling and educating the patient, communicating with other health care professionals, documenting clinical information in the electronic health record,  and care coordination.   Orson Slick, MD Department of Hematology/Oncology [facility] Phone: [phone]  02/18/2022 6:22 PM   Literature Support:  Darrin Nipper, Petrucci MT, [place], [place], [place], [place], Spada S, [place], Ponticelli E, [place], Cavo M, Di Toritto TC, Shela Nevin F, Monte[place]us[place] V, Palumbo A, Boccadoro M, Larocca A. Once-weekly versus twice-weekly carfilzomib in patients with newly diagnosed multiple myeloma: a pooled analysis of two phase I/II studies. Haematologica. 2019 Aug;104(8):1640-1647.  --Once-weekly 70 mg/m2 carfilzomib as induction and maintenance therapy for newly diagnosed multiple myeloma patients was as safe and effective as twice-weekly 36 mg/m2 carfilzomib and provided a more convenient schedule.

## 2022-02-21 ENCOUNTER — Other Ambulatory Visit: Payer: Self-pay

## 2022-02-22 ENCOUNTER — Telehealth: Payer: Self-pay | Admitting: Hematology and Oncology

## 2022-02-22 NOTE — Telephone Encounter (Signed)
R/s per 8/2 in basket, pace has been called and confirmed change

## 2022-02-24 ENCOUNTER — Inpatient Hospital Stay: Payer: Medicare (Managed Care)

## 2022-02-24 ENCOUNTER — Inpatient Hospital Stay: Payer: Medicare (Managed Care) | Attending: Physician Assistant

## 2022-02-24 ENCOUNTER — Other Ambulatory Visit: Payer: Self-pay

## 2022-02-24 VITALS — BP 117/78 | HR 86 | Temp 98.6°F | Resp 18

## 2022-02-24 DIAGNOSIS — Z86718 Personal history of other venous thrombosis and embolism: Secondary | ICD-10-CM | POA: Insufficient documentation

## 2022-02-24 DIAGNOSIS — Z7189 Other specified counseling: Secondary | ICD-10-CM

## 2022-02-24 DIAGNOSIS — C9002 Multiple myeloma in relapse: Secondary | ICD-10-CM | POA: Insufficient documentation

## 2022-02-24 DIAGNOSIS — Z5111 Encounter for antineoplastic chemotherapy: Secondary | ICD-10-CM | POA: Insufficient documentation

## 2022-02-24 LAB — CMP (CANCER CENTER ONLY)
ALT: 25 U/L (ref 0–44)
AST: 15 U/L (ref 15–41)
Albumin: 3.9 g/dL (ref 3.5–5.0)
Alkaline Phosphatase: 100 U/L (ref 38–126)
Anion gap: 6 (ref 5–15)
BUN: 14 mg/dL (ref 8–23)
CO2: 29 mmol/L (ref 22–32)
Calcium: 9 mg/dL (ref 8.9–10.3)
Chloride: 107 mmol/L (ref 98–111)
Creatinine: 1.09 mg/dL (ref 0.61–1.24)
GFR, Estimated: >60 mL/min (ref >60)
Glucose, Bld: 101 mg/dL — ABNORMAL HIGH (ref 70–99)
Potassium: 3.8 mmol/L (ref 3.5–5.1)
Sodium: 142 mmol/L (ref 135–145)
Total Bilirubin: 0.5 mg/dL (ref 0.3–1.2)
Total Protein: 6.6 g/dL (ref 6.5–8.1)

## 2022-02-24 LAB — CBC WITH DIFFERENTIAL (CANCER CENTER ONLY)
Abs Immature Granulocytes: 0.09 10*3/uL — ABNORMAL HIGH (ref 0.00–0.07)
Basophils Absolute: 0 10*3/uL (ref 0.0–0.1)
Basophils Relative: 0 %
Eosinophils Absolute: 0.4 10*3/uL (ref 0.0–0.5)
Eosinophils Relative: 5 %
HCT: 32.2 % — ABNORMAL LOW (ref 39.0–52.0)
Hemoglobin: 11.6 g/dL — ABNORMAL LOW (ref 13.0–17.0)
Immature Granulocytes: 1 %
Lymphocytes Relative: 13 %
Lymphs Abs: 1 10*3/uL (ref 0.7–4.0)
MCH: 30.9 pg (ref 26.0–34.0)
MCHC: 36 g/dL (ref 30.0–36.0)
MCV: 85.9 fL (ref 80.0–100.0)
Monocytes Absolute: 0.8 10*3/uL (ref 0.1–1.0)
Monocytes Relative: 10 %
Neutro Abs: 5.5 10*3/uL (ref 1.7–7.7)
Neutrophils Relative %: 71 %
Platelet Count: 171 10*3/uL (ref 150–400)
RBC: 3.75 MIL/uL — ABNORMAL LOW (ref 4.22–5.81)
RDW: 16.2 % — ABNORMAL HIGH (ref 11.5–15.5)
WBC Count: 7.8 10*3/uL (ref 4.0–10.5)
nRBC: 0.3 % — ABNORMAL HIGH (ref 0.0–0.2)

## 2022-02-24 LAB — LACTATE DEHYDROGENASE: LDH: 97 U/L — ABNORMAL LOW (ref 98–192)

## 2022-02-24 LAB — URIC ACID: Uric Acid, Serum: 6.1 mg/dL (ref 3.7–8.6)

## 2022-02-24 MED ORDER — DEXTROSE 5 % IV SOLN
70.0000 mg/m2 | Freq: Once | INTRAVENOUS | Status: AC
  Administered 2022-02-24: 150 mg via INTRAVENOUS
  Filled 2022-02-24: qty 60

## 2022-02-24 MED ORDER — SODIUM CHLORIDE 0.9 % IV SOLN
40.0000 mg | Freq: Once | INTRAVENOUS | Status: AC
  Administered 2022-02-24: 40 mg via INTRAVENOUS
  Filled 2022-02-24: qty 4

## 2022-02-24 MED ORDER — SODIUM CHLORIDE 0.9 % IV SOLN: Freq: Once | INTRAVENOUS | Status: DC

## 2022-02-24 MED ORDER — SODIUM CHLORIDE 0.9 % IV SOLN: Freq: Once | INTRAVENOUS | Status: AC

## 2022-02-24 NOTE — Patient Instructions (Addendum)
[facility]  Discharge Instructions: Thank you for choosing [facility] to provide your oncology and hematology care.   If you have a lab appointment with the [facility], please go directly to the [facility] and check in at the registration area.   Wear comfortable clothing and clothing appropriate for easy access to any Portacath or PICC line.   We strive to give you quality time with your provider. You may need to reschedule your appointment if you arrive late (15 or more minutes).  Arriving late affects you and other patients whose appointments are after yours.  Also, if you miss three or more appointments without notifying the office, you may be dismissed from the clinic at the provider's discretion.      For prescription refill requests, have your pharmacy contact our office and allow 72 hours for refills to be completed.    Today you received the following chemotherapy and/or immunotherapy agents: Kyprolis.     To help prevent nausea and vomiting after your treatment, we encourage you to take your nausea medication as directed.  BELOW ARE SYMPTOMS THAT SHOULD BE REPORTED IMMEDIATELY: *FEVER GREATER THAN 100.4 F (38 C) OR HIGHER *CHILLS OR SWEATING *NAUSEA AND VOMITING THAT IS NOT CONTROLLED WITH YOUR NAUSEA MEDICATION *UNUSUAL SHORTNESS OF BREATH *UNUSUAL BRUISING OR BLEEDING *URINARY PROBLEMS (pain or burning when urinating, or frequent urination) *BOWEL PROBLEMS (unusual diarrhea, constipation, pain near the anus) TENDERNESS IN MOUTH AND THROAT WITH OR WITHOUT PRESENCE OF ULCERS (sore throat, sores in mouth, or a toothache) UNUSUAL RASH, SWELLING OR PAIN  UNUSUAL VAGINAL DISCHARGE OR ITCHING   Items with * indicate a potential emergency and should be followed up as soon as possible or go to the Emergency Department if any problems should occur.  Please show the CHEMOTHERAPY ALERT CARD or IMMUNOTHERAPY ALERT CARD at check-in to  the Emergency Department and triage nurse.  Should you have questions after your visit or need to cancel or reschedule your appointment, please contact [facility]  Dept: [phone]  and follow the prompts.  Office hours are 8:00 a.m. to 4:30 p.m. Monday - Friday. Please note that voicemails left after 4:00 p.m. may not be returned until the following business day.  We are closed weekends and major holidays. You have access to a nurse at all times for urgent questions. Please call the main number to the clinic Dept: [phone] and follow the prompts.   For any non-urgent questions, you may also contact your provider using MyChart. We now offer e-Visits for anyone 49 and older to request care online for non-urgent symptoms. For details visit [email or web address].   Also download the MyChart app! Go to the app store, search "MyChart", open the app, select [facility], and log in with your MyChart username and password.  Masks are optional in the cancer centers. If you would like for your care team to wear a mask while they are taking care of you, please let them know. You may have one support person who is at least 68 years old accompany you for your appointments.

## 2022-03-03 ENCOUNTER — Inpatient Hospital Stay: Payer: Medicare (Managed Care)

## 2022-03-03 ENCOUNTER — Inpatient Hospital Stay: Payer: Medicare (Managed Care) | Admitting: Hematology and Oncology

## 2022-03-10 ENCOUNTER — Inpatient Hospital Stay: Payer: Medicare (Managed Care)

## 2022-03-10 ENCOUNTER — Other Ambulatory Visit: Payer: Self-pay

## 2022-03-10 ENCOUNTER — Inpatient Hospital Stay (HOSPITAL_BASED_OUTPATIENT_CLINIC_OR_DEPARTMENT_OTHER): Payer: Medicare (Managed Care) | Admitting: Physician Assistant

## 2022-03-10 ENCOUNTER — Ambulatory Visit: Payer: Medicare (Managed Care) | Admitting: Physician Assistant

## 2022-03-10 VITALS — BP 132/75 | HR 75 | Temp 98.5°F | Resp 16 | Wt 249.0 lb

## 2022-03-10 VITALS — BP 127/78 | HR 74 | Temp 97.7°F | Resp 16

## 2022-03-10 DIAGNOSIS — Z5111 Encounter for antineoplastic chemotherapy: Secondary | ICD-10-CM | POA: Diagnosis not present

## 2022-03-10 DIAGNOSIS — C9002 Multiple myeloma in relapse: Secondary | ICD-10-CM

## 2022-03-10 DIAGNOSIS — Z7189 Other specified counseling: Secondary | ICD-10-CM

## 2022-03-10 LAB — CMP (CANCER CENTER ONLY)
ALT: 19 U/L (ref 0–44)
AST: 12 U/L — ABNORMAL LOW (ref 15–41)
Albumin: 4.3 g/dL (ref 3.5–5.0)
Alkaline Phosphatase: 103 U/L (ref 38–126)
Anion gap: 6 (ref 5–15)
BUN: 10 mg/dL (ref 8–23)
CO2: 26 mmol/L (ref 22–32)
Calcium: 9.8 mg/dL (ref 8.9–10.3)
Chloride: 110 mmol/L (ref 98–111)
Creatinine: 0.75 mg/dL (ref 0.61–1.24)
GFR, Estimated: >60 mL/min (ref >60)
Glucose, Bld: 114 mg/dL — ABNORMAL HIGH (ref 70–99)
Potassium: 4 mmol/L (ref 3.5–5.1)
Sodium: 142 mmol/L (ref 135–145)
Total Bilirubin: 0.7 mg/dL (ref 0.3–1.2)
Total Protein: 6.8 g/dL (ref 6.5–8.1)

## 2022-03-10 LAB — CBC WITH DIFFERENTIAL (CANCER CENTER ONLY)
Abs Immature Granulocytes: 0.03 K/uL (ref 0.00–0.07)
Basophils Absolute: 0 K/uL (ref 0.0–0.1)
Basophils Relative: 1 %
Eosinophils Absolute: 0.3 K/uL (ref 0.0–0.5)
Eosinophils Relative: 4 %
HCT: 35.1 % — ABNORMAL LOW (ref 39.0–52.0)
Hemoglobin: 12.4 g/dL — ABNORMAL LOW (ref 13.0–17.0)
Immature Granulocytes: 1 %
Lymphocytes Relative: 15 %
Lymphs Abs: 0.9 K/uL (ref 0.7–4.0)
MCH: 30.2 pg (ref 26.0–34.0)
MCHC: 35.3 g/dL (ref 30.0–36.0)
MCV: 85.6 fL (ref 80.0–100.0)
Monocytes Absolute: 0.6 K/uL (ref 0.1–1.0)
Monocytes Relative: 10 %
Neutro Abs: 4.5 K/uL (ref 1.7–7.7)
Neutrophils Relative %: 69 %
Platelet Count: 474 K/uL — ABNORMAL HIGH (ref 150–400)
RBC: 4.1 MIL/uL — ABNORMAL LOW (ref 4.22–5.81)
RDW: 15.5 % (ref 11.5–15.5)
WBC Count: 6.3 K/uL (ref 4.0–10.5)
nRBC: 0 % (ref 0.0–0.2)

## 2022-03-10 LAB — URIC ACID: Uric Acid, Serum: 5.5 mg/dL (ref 3.7–8.6)

## 2022-03-10 LAB — LACTATE DEHYDROGENASE: LDH: 149 U/L (ref 98–192)

## 2022-03-10 MED ORDER — SODIUM CHLORIDE 0.9 % IV SOLN: Freq: Once | INTRAVENOUS | Status: AC

## 2022-03-10 MED ORDER — DEXTROSE 5 % IV SOLN
70.0000 mg/m2 | Freq: Once | INTRAVENOUS | Status: AC
  Administered 2022-03-10: 150 mg via INTRAVENOUS
  Filled 2022-03-10: qty 60

## 2022-03-10 MED ORDER — SODIUM CHLORIDE 0.9 % IV SOLN
40.0000 mg | Freq: Once | INTRAVENOUS | Status: AC
  Administered 2022-03-10: 40 mg via INTRAVENOUS
  Filled 2022-03-10: qty 4

## 2022-03-10 NOTE — Progress Notes (Signed)
[name]:([phone]   Fax:([phone]) [phone]  PROGRESS NOTE  Patient Care Team: [name], MD as PCP - General (Family Medicine) [name], MD as Consulting Physician (Physical Medicine and Rehabilitation) Donia Guiles Lavon Paganini, PA-C as Physician Assistant (Physical Medicine and Rehabilitation) Orson Slick, MD as Consulting Physician (Hematology and Oncology)  Hematological/Oncological History # IgG Lambda Multiple Myeloma, Relapsed (ISS Stage II) 1) 06/2010: initial diagnosis of Multiple Myeloma after T8 compression fracture. Treated with Velcade/Revlimid/Dexamethasone and achieved a complete remission 2) Velcade was discontinued in September 2012 and that Revlimid and Decadron were discontinued in March 2013. 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 4) Followed by Dr. Alvy Bimler, last clinic visit 10/09/2019. At that time there was concern for relapse of his multiple myeloma.  5) Patient requested transfer to different provider after misunderstanding regarding imaging studies 6) 12/17/2019: transfer care to Dr. Lorenso Courier  7) 01/09/2020: Cycle 1 Day 1 of Dara/Velcade/Dex 8) 01/21/2020: presented as urgent visit for diarrhea and dehydration. Holding chemotherapy scheduled for 01/23/2020. 9) 01/30/2020: Resume dara/velcade/dex after resolution of diarrhea.  10) 02/13/2020: restaging labs show M protein 0.8, Kappa 4.5, lamba 17.2, ratio 0.26, urine M protein 53 (7.1%). All MM labs indicate improvement.  11) 03/10/2020: Cycle 4 Day 1 of Dara/Velcade/Dex. Transition to q 3 week daratumumab.  12) 06/04/2020:  Cycle 8 Day 1 of Dara/Velcade/Dex 13) 06/25/2020:  Cycle 9 Day 1 of Dara/Velcade/Dex 14) 07/22/2020: Cycle 10 Day 1 of  Dara//Dex 15) 08/18/2020: Cycle 11 Day 1 of Dara//Dex 16) 09/23/2020: Cycle 12 Day 1 of Dara//Dex 17) 10/22/2020: Cycle 13 Day 1 of Dara/Dex   18) 11/19/2020: Cycle 14 Day 1 of Dara/Dex  19) 12/17/2020: Cycle 15 Day 1 of Dara/Dex  20) 01/17/2021: Cycle 16 Day 1 of Dara/Dex  21) 02/11/2021: Cycle 17 Day 1 of Dara/Dex 22) 03/10/2021: Cycle 18 Day 1 of Dara/Dex  23) 04/08/2021: Cycle 19 Day 1 of Dara/Dex  24) 08/03/2021: Cycle 20 Day 1 of Dara/Dex (delayed due to scheduling error) 25) 09/02/2021: Cycle 21 Day 1 of Dara/Dex 26) 09/30/2021: Cycle 22 Day 1 of Dara/Dex 27) 10/28/2021: Cycle 23 Day 1 of Dara/Dex 28) 12/02/2021: Cycle 24 Day 1 of Dara/Dex 29) 12/30/2021: Cycle 25 Day 1 of Dara/Dex 30) 01/06/2022: Cycle 1 Day 1 of Kyprolis/Dex 31) 02/03/2022: Cycle 2 Day 1 of Kyprolis/Dex 32) 03/10/2022: Cycle 3 Day 1 of Kyprolis/Dex  Interval History:  [name] 66 y.o. male with medical history significant for  IgG Lambda Multiple Myeloma who presents for a follow up visit. The patient's last visit was on 02/17/2022.  He presents today to for cycle 3 day 1 of Kyprolis/Dex.   On exam today [name] reports he is tolerating the Kyprolis therapy well so far without any major side effects. His energy levels are stable. His appetite has improved after taking dexamethasone. He denies nausea, vomiting or abdominal pain, easy bruising, bleeding, neuropathy, edema, shortness of breath, chest pain or cough. He has no other complaints.A full 10 point ROS is listed below.  MEDICAL HISTORY:  Past Medical History:  Diagnosis Date   Adrenal insufficiency ([place])    on chronic dexamethasone   Anemia    Cancer ([place])    Coagulopathy ([facility])    on xeralto/ s/p DVT while on coumadin,  IVC in place   Diabetes mellitus without complication ([place])    type 2  Gross hematuria 7/14   post foley cath procedure   History of blood transfusion 7/14   Multiple myeloma    thoracic T8 with paraplegia s/p resection- on chemo at visit 10/13/10   Multiple myeloma    Multiple myeloma without mention of remission    Neurogenic bladder    Neurogenic bowel    Paraplegia  ([place])    Partial small bowel obstruction ([place]) during dec 2011 admission    SURGICAL HISTORY: Past Surgical History:  Procedure Laterality Date   COLONOSCOPY WITH PROPOFOL N/A 04/12/2017   Procedure: COLONOSCOPY WITH PROPOFOL;  Surgeon: Tiawanna Luchsinger Shipper, MD;  Location: [facility];  Service: Endoscopy;  Laterality: N/A;   COLONOSCOPY WITH PROPOFOL N/A 04/19/2017   Procedure: COLONOSCOPY WITH PROPOFOL;  Surgeon: Yetta Flock, MD;  Location: [facility];  Service: Gastroenterology;  Laterality: N/A;   COLOSTOMY  07/20/2011   Procedure: COLOSTOMY;  Surgeon: Judieth Keens, DO;  Location: [facility];  Service: General;;   COLOSTOMY REVISION  07/20/2011   Procedure: COLON RESECTION SIGMOID;  Surgeon: Judieth Keens, DO;  Location: [facility];  Service: General;;   CYSTOSCOPY N/A 04/04/2013   Procedure: CYSTOSCOPY WITH LITHALOPAXY;  Surgeon: Alexis Frock, MD;  Location: [facility];  Service: Urology;  Laterality: N/A;   INSERTION OF SUPRAPUBIC CATHETER N/A 04/04/2013   Procedure: INSERTION OF SUPRAPUBIC CATHETER;  Surgeon: Alexis Frock, MD;  Location: [facility];  Service: Urology;  Laterality: N/A;   LAPAROTOMY  07/20/2011   Procedure: EXPLORATORY LAPAROTOMY;  Surgeon: Judieth Keens, DO;  Location: [facility];  Service: General;  Laterality: N/A;   myeloma thoracic T8 with parpaplegia s/p thoracotomy and thoracic T7-9 cage placement on Dec 26th 2011  07/18/10    SOCIAL HISTORY: Social History   Socioeconomic History   Marital status: Married    Spouse name: Not on file   Number of children: Not on file   Years of education: Not on file   Highest education level: Not on file  Occupational History   Not on file  Tobacco Use   Smoking status: Never   Smokeless tobacco: Never  Vaping Use   Vaping Use: Never used  Substance and Sexual Activity   Alcohol use: No   Drug use: No   Sexual activity: Never  Other Topics Concern   Not on file  Social History Narrative   Not on file    Social Determinants of Health   Financial Resource Strain: Not on file  Food Insecurity: Not on file  Transportation Needs: Not on file  Physical Activity: Not on file  Stress: Not on file  Social Connections: Not on file  Intimate Partner Violence: Not on file    FAMILY HISTORY: Family History  Problem Relation Age of Onset   Ovarian cancer Mother    Diabetes Father     ALLERGIES:  is allergic to feraheme [ferumoxytol].  MEDICATIONS:  Current Outpatient Medications  Medication Sig Dispense Refill   acyclovir (ZOVIRAX) 400 MG tablet Take 1 tablet (400 mg total) by mouth 2 (two) times daily. 60 tablet 2   atorvastatin (LIPITOR) 10 MG tablet Take 10 mg by mouth daily.     baclofen (LIORESAL) 20 MG tablet Take 20 mg by mouth 2 (two) times daily.     diazepam (DIASTAT ACUDIAL) 10 MG GEL SMARTSIG:By Mouth     dorzolamide (TRUSOPT) 2 % ophthalmic solution 1 drop 2 (two) times daily.     iron polysaccharides (NU-IRON) 150 MG capsule Take 1 capsule (  150 mg total) by mouth daily. 30 capsule 0   latanoprost (XALATAN) 0.005 % ophthalmic solution Place 1 drop into both eyes at bedtime.     ondansetron (ZOFRAN) 8 MG tablet Take 1 tablet (8 mg total) by mouth every 8 (eight) hours as needed for nausea or vomiting. 30 tablet 0   rivaroxaban (XARELTO) 10 MG TABS tablet Take 1 tablet (10 mg total) by mouth daily with supper. 30 tablet 9   Skin Protectants, Misc. (EUCERIN) cream Apply 1 application topically 2 (two) times daily as needed for dry skin.      sulfamethoxazole-trimethoprim (BACTRIM DS) 800-160 MG tablet Take 1 tablet by mouth 2 (two) times daily.     zinc oxide (BALMEX) 11.3 % CREA cream Apply 1 application topically 2 (two) times daily.     No current facility-administered medications for this visit.   Facility-Administered Medications Ordered in Other Visits  Medication Dose Route Frequency Provider Last Rate Last Admin   sodium chloride flush (NS) 0.9 % injection 10 mL  10  mL Intravenous PRN Alvy Bimler, Ni, MD        REVIEW OF SYSTEMS:   Constitutional: ( - ) fevers, ( - )  chills , ( - ) night sweats Eyes: ( - ) blurriness of vision, ( - ) double vision, ( - ) watery eyes Ears, nose, mouth, throat, and face: ( - ) mucositis, ( - ) sore throat Respiratory: ( - ) cough, ( - ) dyspnea, ( - ) wheezes Cardiovascular: ( - ) palpitation, ( - ) chest discomfort, ( - ) lower extremity swelling Gastrointestinal:  ( - ) nausea, ( - ) heartburn, ( - ) change in bowel habits Skin: ( - ) abnormal skin rashes Lymphatics: ( - ) new lymphadenopathy, ( - ) easy bruising Neurological: ( - ) numbness, ( - ) tingling, ( - ) new weaknesses Behavioral/Psych: ( - ) mood change, ( - ) new changes  All other systems were reviewed with the patient and are negative.  PHYSICAL EXAMINATION: ECOG PERFORMANCE STATUS: paraplegic.   Vitals:   03/10/22 1217  BP: 127/78  Pulse: 74  Resp: 16  Temp: 97.7 F (36.5 C)  SpO2: 100%      There were no vitals filed for this visit.  GENERAL: well appearing middle aged [place] American male alert, no distress and comfortable SKIN: skin color, texture, turgor are normal, no rashes or significant lesions EYES: conjunctiva are pink and non-injected, sclera clear LUNGS: clear to auscultation and percussion with normal breathing effort HEART: regular rate & rhythm and no murmurs and no lower extremity edema Musculoskeletal: no cyanosis of digits and no clubbing  PSYCH: alert & oriented x 3, fluent speech NEURO: paraplegic, no use of LE bilaterally.   LABORATORY DATA:  I have reviewed the data as listed    Latest Ref Rng & Units 02/24/2022    1:18 PM 02/17/2022   10:51 AM 02/10/2022    2:10 PM  CBC  WBC 4.0 - 10.5 K/uL 7.8  7.0  7.4   Hemoglobin 13.0 - 17.0 g/dL 11.6  12.0  11.5   Hematocrit 39.0 - 52.0 % 32.2  33.6  32.2   Platelets 150 - 400 K/uL 171  209  449        Latest Ref Rng & Units 02/24/2022    1:18 PM 02/17/2022   10:51 AM  02/10/2022    2:10 PM  CMP  Glucose 70 - 99 mg/dL 101  108  113   BUN 8 - 23 mg/dL _0 Creatinine 0.61 - 1.24 mg/dL 1.09  0.77  0.82   Sodium 135 - 145 mmol/L 142  140  143   Potassium 3.5 - 5.1 mmol/L 3.8  4.6  4.2   Chloride 98 - 111 mmol/L 107  109  107   CO2 22 - 32 mmol/L _1 Calcium 8.9 - 10.3 mg/dL 9.0  9.5  9.4   Total Protein 6.5 - 8.1 g/dL 6.6  6.9  6.0   Total Bilirubin 0.3 - 1.2 mg/dL 0.5  0.6  0.5   Alkaline Phos 38 - 126 U/L 100  106  110   AST 15 - 41 U/L _2 ALT 0 - 44 U/L _3 Lab Results  Component Value Date   MPROTEIN 0.5 (H) 02/10/2022   MPROTEIN 1.1 (H) 01/12/2022   MPROTEIN 1.4 (H) 12/30/2021   Lab Results  Component Value Date   KPAFRELGTCHN 6.3 02/10/2022   KPAFRELGTCHN 5.3 01/12/2022   KPAFRELGTCHN 4.9 12/30/2021   LAMBDASER 16.1 02/10/2022   LAMBDASER 38.4 (H) 01/12/2022   LAMBDASER 81.6 (H) 12/30/2021   KAPLAMBRATIO 0.39 02/10/2022   KAPLAMBRATIO >5.75 01/20/2022   KAPLAMBRATIO 0.14 (L) 01/12/2022    RADIOGRAPHIC STUDIES: No results found.  ASSESSMENT & PLAN EANN CLELAND 66 y.o. male with medical history significant for  IgG Lambda Multiple Myeloma who presents for a follow up visit.  After review of the labs, discussion with the patient, and reviewed the imaging his findings are most consistent with a relapsed multiple myeloma.The patient has had excellent success before with treatment of his myeloma with Velcade, Revlimid, and dexamethasone.  Transition to daratumumab therapy and was on a once monthly treatments but unfortunately developed progression of disease.  We have transitioned to Kyprolis/Dex.  # IgG Lambda Multiple Myeloma, Relapsed (ISS Stage II) --findings are most consistent with relapsed multiple myeloma. Patient previously successfully treated with Velcade/Rev/Dex and Daratumumab/Velcade/Dex. On 07/02/2020 he transitioned to monthly daratumumab alone.  --due to to rise in M protein,  switched to Kyprolis and Dexamethasone on 01/06/2022  Plan: --labs today were reviewed and adequate for treatment.  --Labs show white blood cell count 6.3, hemoglobin 12.4, MCV 85.6, and platelets of 474.  SPEP from 02/10/2022 showed M protein dropped to 0.5 with normalization of kappa lambda ratio to 0.39 --patient will proceed with Cycle 3 Day 1 Kyprolis today.  --return for weekly Kyprolis and clinic visit  in 2 weeks.   #History of DVT --He had placement of IVC filter, remains on Xarelto. --Due to poor mobility, and lack of bleeding complications, I recommend he remain on Xarelto indefinitely. --caution if Plt count were to drop <50  # Supportive Care -- provided patient with an albuterol inhaler (for use with daratumumab) --acyclovir 469m BID for VZV prophylaxis --zofran [id number] PRN for nausea/vomiting  --Zometa can be considered after dental clearance, though with his prior episode of osteonecrosis the risk may outweigh the benefits. --patient recieved pretreatment RBC phenotype  No orders of the defined types were placed in this encounter.   All questions were answered. The patient knows to call the clinic with any problems, questions or concerns.  I have spent a total of 30 minutes minutes of face-to-face and non-face-to-face time, preparing to see the patient,  performing a medically appropriate examination, counseling and educating the patient,  communicating with other health care professionals, documenting clinical information in the electronic health record,  and care coordination.   Lincoln Brigham, PA-C Department of Hematology/Oncology [facility] Phone: [phone]  03/10/2022 12:20 PM   Literature Support:  Darrin Nipper, Petrucci MT, [place], [place], [place], [place], Spada S, [place], Ponticelli E, [place], Cavo M, Di Toritto TC, Shela Nevin F, Monte[place] V, Palumbo A, Boccadoro M, Larocca A. Once-weekly  versus twice-weekly carfilzomib in patients with newly diagnosed multiple myeloma: a pooled analysis of two phase I/II studies. Haematologica. 2019 Aug;104(8):1640-1647.  --Once-weekly 70 mg/m2 carfilzomib as induction and maintenance therapy for newly diagnosed multiple myeloma patients was as safe and effective as twice-weekly 36 mg/m2 carfilzomib and provided a more convenient schedule.

## 2022-03-10 NOTE — Patient Instructions (Signed)
[facility]  Discharge Instructions: Thank you for choosing [facility] to provide your oncology and hematology care.   If you have a lab appointment with the [facility], please go directly to the [facility] and check in at the registration area.   Wear comfortable clothing and clothing appropriate for easy access to any Portacath or PICC line.   We strive to give you quality time with your provider. You may need to reschedule your appointment if you arrive late (15 or more minutes).  Arriving late affects you and other patients whose appointments are after yours.  Also, if you miss three or more appointments without notifying the office, you may be dismissed from the clinic at the provider's discretion.      For prescription refill requests, have your pharmacy contact our office and allow 72 hours for refills to be completed.    Today you received the following chemotherapy and/or immunotherapy agents: Kyprolis.     To help prevent nausea and vomiting after your treatment, we encourage you to take your nausea medication as directed.  BELOW ARE SYMPTOMS THAT SHOULD BE REPORTED IMMEDIATELY: *FEVER GREATER THAN 100.4 F (38 C) OR HIGHER *CHILLS OR SWEATING *NAUSEA AND VOMITING THAT IS NOT CONTROLLED WITH YOUR NAUSEA MEDICATION *UNUSUAL SHORTNESS OF BREATH *UNUSUAL BRUISING OR BLEEDING *URINARY PROBLEMS (pain or burning when urinating, or frequent urination) *BOWEL PROBLEMS (unusual diarrhea, constipation, pain near the anus) TENDERNESS IN MOUTH AND THROAT WITH OR WITHOUT PRESENCE OF ULCERS (sore throat, sores in mouth, or a toothache) UNUSUAL RASH, SWELLING OR PAIN  UNUSUAL VAGINAL DISCHARGE OR ITCHING   Items with * indicate a potential emergency and should be followed up as soon as possible or go to the Emergency Department if any problems should occur.  Please show the CHEMOTHERAPY ALERT CARD or IMMUNOTHERAPY ALERT CARD at check-in to  the Emergency Department and triage nurse.  Should you have questions after your visit or need to cancel or reschedule your appointment, please contact [facility]  Dept: [phone]  and follow the prompts.  Office hours are 8:00 a.m. to 4:30 p.m. Monday - Friday. Please note that voicemails left after 4:00 p.m. may not be returned until the following business day.  We are closed weekends and major holidays. You have access to a nurse at all times for urgent questions. Please call the main number to the clinic Dept: [phone] and follow the prompts.   For any non-urgent questions, you may also contact your provider using MyChart. We now offer e-Visits for anyone 39 and older to request care online for non-urgent symptoms. For details visit [email or web address].   Also download the MyChart app! Go to the app store, search "MyChart", open the app, select [facility], and log in with your MyChart username and password.  Masks are optional in the cancer centers. If you would like for your care team to wear a mask while they are taking care of you, please let them know. You may have one support person who is at least 68 years old accompany you for your appointments.

## 2022-03-13 LAB — KAPPA/LAMBDA LIGHT CHAINS
Kappa free light chain: 4.4 mg/L (ref 3.3–19.4)
Kappa, lambda light chain ratio: 0.35 (ref 0.26–1.65)
Lambda free light chains: 12.4 mg/L (ref 5.7–26.3)

## 2022-03-17 ENCOUNTER — Inpatient Hospital Stay: Payer: Medicare (Managed Care)

## 2022-03-17 ENCOUNTER — Inpatient Hospital Stay: Payer: Medicare (Managed Care) | Admitting: Physician Assistant

## 2022-03-17 ENCOUNTER — Ambulatory Visit: Payer: Medicare (Managed Care)

## 2022-03-17 ENCOUNTER — Other Ambulatory Visit: Payer: Medicare (Managed Care)

## 2022-03-17 VITALS — BP 121/67 | HR 64 | Temp 98.2°F | Resp 16

## 2022-03-17 DIAGNOSIS — Z5111 Encounter for antineoplastic chemotherapy: Secondary | ICD-10-CM | POA: Diagnosis not present

## 2022-03-17 DIAGNOSIS — Z7189 Other specified counseling: Secondary | ICD-10-CM

## 2022-03-17 DIAGNOSIS — C9002 Multiple myeloma in relapse: Secondary | ICD-10-CM

## 2022-03-17 LAB — MULTIPLE MYELOMA PANEL, SERUM
Albumin SerPl Elph-Mcnc: 3.5 g/dL (ref 2.9–4.4)
Albumin/Glob SerPl: 1.3 (ref 0.7–1.7)
Alpha 1: 0.3 g/dL (ref 0.0–0.4)
Alpha2 Glob SerPl Elph-Mcnc: 0.8 g/dL (ref 0.4–1.0)
B-Globulin SerPl Elph-Mcnc: 1 g/dL (ref 0.7–1.3)
Gamma Glob SerPl Elph-Mcnc: 0.7 g/dL (ref 0.4–1.8)
Globulin, Total: 2.7 g/dL (ref 2.2–3.9)
IgA: 12 mg/dL — ABNORMAL LOW (ref 61–437)
IgG (Immunoglobin G), Serum: 681 mg/dL (ref 603–1613)
IgM (Immunoglobulin M), Srm: 7 mg/dL — ABNORMAL LOW (ref 20–172)
M Protein SerPl Elph-Mcnc: 0.4 g/dL — ABNORMAL HIGH
Total Protein ELP: 6.2 g/dL (ref 6.0–8.5)

## 2022-03-17 LAB — CMP (CANCER CENTER ONLY)
ALT: 19 U/L (ref 0–44)
AST: 10 U/L — ABNORMAL LOW (ref 15–41)
Albumin: 3.8 g/dL (ref 3.5–5.0)
Alkaline Phosphatase: 89 U/L (ref 38–126)
Anion gap: 5 (ref 5–15)
BUN: 13 mg/dL (ref 8–23)
CO2: 26 mmol/L (ref 22–32)
Calcium: 9.5 mg/dL (ref 8.9–10.3)
Chloride: 112 mmol/L — ABNORMAL HIGH (ref 98–111)
Creatinine: 0.61 mg/dL (ref 0.61–1.24)
GFR, Estimated: >60 mL/min (ref >60)
Glucose, Bld: 93 mg/dL (ref 70–99)
Potassium: 3.8 mmol/L (ref 3.5–5.1)
Sodium: 143 mmol/L (ref 135–145)
Total Bilirubin: 0.8 mg/dL (ref 0.3–1.2)
Total Protein: 6.1 g/dL — ABNORMAL LOW (ref 6.5–8.1)

## 2022-03-17 LAB — CBC WITH DIFFERENTIAL (CANCER CENTER ONLY)
Abs Immature Granulocytes: 0.04 10*3/uL (ref 0.00–0.07)
Basophils Absolute: 0 10*3/uL (ref 0.0–0.1)
Basophils Relative: 0 %
Eosinophils Absolute: 0.3 10*3/uL (ref 0.0–0.5)
Eosinophils Relative: 4 %
HCT: 31.8 % — ABNORMAL LOW (ref 39.0–52.0)
Hemoglobin: 11.4 g/dL — ABNORMAL LOW (ref 13.0–17.0)
Immature Granulocytes: 1 %
Lymphocytes Relative: 15 %
Lymphs Abs: 1.2 10*3/uL (ref 0.7–4.0)
MCH: 30.4 pg (ref 26.0–34.0)
MCHC: 35.8 g/dL (ref 30.0–36.0)
MCV: 84.8 fL (ref 80.0–100.0)
Monocytes Absolute: 1 10*3/uL (ref 0.1–1.0)
Monocytes Relative: 12 %
Neutro Abs: 5.5 10*3/uL (ref 1.7–7.7)
Neutrophils Relative %: 68 %
Platelet Count: 193 10*3/uL (ref 150–400)
RBC: 3.75 MIL/uL — ABNORMAL LOW (ref 4.22–5.81)
RDW: 15.6 % — ABNORMAL HIGH (ref 11.5–15.5)
WBC Count: 8 10*3/uL (ref 4.0–10.5)
nRBC: 0 % (ref 0.0–0.2)

## 2022-03-17 LAB — URIC ACID: Uric Acid, Serum: 5.7 mg/dL (ref 3.7–8.6)

## 2022-03-17 LAB — LACTATE DEHYDROGENASE: LDH: 111 U/L (ref 98–192)

## 2022-03-17 MED ORDER — SODIUM CHLORIDE 0.9 % IV SOLN: Freq: Once | INTRAVENOUS | Status: AC

## 2022-03-17 MED ORDER — DEXTROSE 5 % IV SOLN
70.0000 mg/m2 | Freq: Once | INTRAVENOUS | Status: AC
  Administered 2022-03-17: 150 mg via INTRAVENOUS
  Filled 2022-03-17: qty 60

## 2022-03-17 MED ORDER — SODIUM CHLORIDE 0.9 % IV SOLN: Freq: Once | INTRAVENOUS | Status: DC

## 2022-03-17 MED ORDER — SODIUM CHLORIDE 0.9 % IV SOLN
40.0000 mg | Freq: Once | INTRAVENOUS | Status: AC
  Administered 2022-03-17: 40 mg via INTRAVENOUS
  Filled 2022-03-17: qty 4

## 2022-03-17 NOTE — Progress Notes (Signed)
Pt stated he experienced hives to his left forearm (proximal to IV site)  post his last treatment with kyprolis.  No additional complaints.  Hives lasted approximately 5 hrs. Pt has no complaints today.  Reviewed with Dr Lorenso Courier.  No additional orders.  Will continue to monitor.

## 2022-03-17 NOTE — Patient Instructions (Signed)
[facility]  Discharge Instructions: Thank you for choosing [facility] to provide your oncology and hematology care.   If you have a lab appointment with the [facility], please go directly to the [facility] and check in at the registration area.   Wear comfortable clothing and clothing appropriate for easy access to any Portacath or PICC line.   We strive to give you quality time with your provider. You may need to reschedule your appointment if you arrive late (15 or more minutes).  Arriving late affects you and other patients whose appointments are after yours.  Also, if you miss three or more appointments without notifying the office, you may be dismissed from the clinic at the provider's discretion.      For prescription refill requests, have your pharmacy contact our office and allow 72 hours for refills to be completed.    Today you received the following chemotherapy and/or immunotherapy agents: Kyprolis.     To help prevent nausea and vomiting after your treatment, we encourage you to take your nausea medication as directed.  BELOW ARE SYMPTOMS THAT SHOULD BE REPORTED IMMEDIATELY: *FEVER GREATER THAN 100.4 F (38 C) OR HIGHER *CHILLS OR SWEATING *NAUSEA AND VOMITING THAT IS NOT CONTROLLED WITH YOUR NAUSEA MEDICATION *UNUSUAL SHORTNESS OF BREATH *UNUSUAL BRUISING OR BLEEDING *URINARY PROBLEMS (pain or burning when urinating, or frequent urination) *BOWEL PROBLEMS (unusual diarrhea, constipation, pain near the anus) TENDERNESS IN MOUTH AND THROAT WITH OR WITHOUT PRESENCE OF ULCERS (sore throat, sores in mouth, or a toothache) UNUSUAL RASH, SWELLING OR PAIN  UNUSUAL VAGINAL DISCHARGE OR ITCHING   Items with * indicate a potential emergency and should be followed up as soon as possible or go to the Emergency Department if any problems should occur.  Please show the CHEMOTHERAPY ALERT CARD or IMMUNOTHERAPY ALERT CARD at check-in to  the Emergency Department and triage nurse.  Should you have questions after your visit or need to cancel or reschedule your appointment, please contact [facility]  Dept: [phone]  and follow the prompts.  Office hours are 8:00 a.m. to 4:30 p.m. Monday - Friday. Please note that voicemails left after 4:00 p.m. may not be returned until the following business day.  We are closed weekends and major holidays. You have access to a nurse at all times for urgent questions. Please call the main number to the clinic Dept: [phone] and follow the prompts.   For any non-urgent questions, you may also contact your provider using MyChart. We now offer e-Visits for anyone 68 and older to request care online for non-urgent symptoms. For details visit [email or web address].   Also download the MyChart app! Go to the app store, search "MyChart", open the app, select [facility], and log in with your MyChart username and password.  Masks are optional in the cancer centers. If you would like for your care team to wear a mask while they are taking care of you, please let them know. You may have one support person who is at least 68 years old accompany you for your appointments.  Carfilzomib Injection What is this medication? CARFILZOMIB (kar FILZ oh mib) treats multiple myeloma, a type of bone marrow cancer. It works by blocking a protein that causes cancer cells to grow and multiply. This helps to slow or stop the spread of cancer cells. This medicine may be used for other purposes; ask your health care provider or pharmacist if you  have questions. COMMON BRAND NAME(S): KYPROLIS What should I tell my care team before I take this medication? They need to know if you have any of these conditions: Heart disease History of blood clots Irregular heartbeat Kidney disease Liver disease Lung or breathing disease An unusual or allergic reaction to carfilzomib, or other  medications, foods, dyes, or preservatives If you or your partner are pregnant or trying to get pregnant Breastfeeding How should I use this medication? This medication is injected into a vein. It is given by your care team in a hospital or clinic setting. Talk to your care team about the use of this medication in children. Special care may be needed. Overdosage: If you think you have taken too much of this medicine contact a poison control center or emergency room at once. NOTE: This medicine is only for you. Do not share this medicine with others. What if I miss a dose? Keep appointments for follow-up doses. It is important not to miss your dose. Call your care team if you are unable to keep an appointment. What may interact with this medication? Interactions are not expected. This list may not describe all possible interactions. Give your health care provider a list of all the medicines, herbs, non-prescription drugs, or dietary supplements you use. Also tell them if you smoke, drink alcohol, or use illegal drugs. Some items may interact with your medicine. What should I watch for while using this medication? Your condition will be monitored carefully while you are receiving this medication. You may need blood work while taking this medication. Check with your care team if you have severe diarrhea, nausea, and vomiting, or if you sweat a lot. The loss of too much body fluid may make it dangerous for you to take this medication. This medication may affect your coordination, reaction time, or judgment. Do not drive or operate machinery until you know how this medication affects you. Sit up or stand slowly to reduce the risk of dizzy or fainting spells. Drinking alcohol with this medication can increase the risk of these side effects. Talk to your care team if you may be pregnant. Serious birth defects can occur if you take this medication during pregnancy and for 6 months after the last dose. You  will need a negative pregnancy test before starting this medication. Contraception is recommended while taking this medication and for 6 months after the last dose. Your care team can help you find an option that works for you. If your partner can get pregnant, use a condom during sex while taking this medication and for 3 months after the last dose. Do not breastfeed while taking this medication and for 2 weeks after the last dose. This medication may cause infertility. Talk to your care team if you are concerned about your fertility. What side effects may I notice from receiving this medication? Side effects that you should report to your care team as soon as possible: Allergic reactions--skin rash, itching, hives, swelling of the face, lips, tongue, or throat Bleeding--bloody or black, tar-like stools, vomiting blood or brown material that looks like coffee grounds, red or dark brown urine, small red or purple spots on skin, unusual bruising or bleeding Blood clot--pain, swelling, or warmth in the leg, shortness of breath, chest pain Dizziness, loss of balance or coordination, confusion or trouble speaking Heart attack--pain or tightness in the chest, shoulders, arms, or jaw, nausea, shortness of breath, cold or clammy skin, feeling faint or lightheaded Heart failure--shortness of breath,  swelling of the ankles, feet, or hands, sudden weight gain, unusual weakness or fatigue Heart rhythm changes--fast or irregular heartbeat, dizziness, feeling faint or lightheaded, chest pain, trouble breathing Increase in blood pressure Infection--fever, chills, cough, sore throat, wounds that don't heal, pain or trouble when passing urine, general feeling of discomfort or being unwell Infusion reactions--chest pain, shortness of breath or trouble breathing, feeling faint or lightheaded Kidney injury--decrease in the amount of urine, swelling of the ankles, hands, or feet Liver injury--right upper belly pain,  loss of appetite, nausea, light-colored stool, dark yellow or brown urine, yellowing skin or eyes, unusual weakness or fatigue Lung injury--shortness of breath or trouble breathing, cough, spitting up blood, chest pain, fever Pulmonary hypertension--shortness of breath, chest pain, fast or irregular heartbeat, feeling faint or lightheaded, fatigue, swelling of the ankles or feet Stomach pain, bloody diarrhea, pale skin, unusual weakness or fatigue, decrease in the amount of urine, which may be signs of hemolytic uremic syndrome Sudden and severe headache, confusion, change in vision, seizures, which may be signs of posterior reversible encephalopathy syndrome (PRES) TTP--purple spots on the skin or inside the mouth, pale skin, yellowing skin or eyes, unusual weakness or fatigue, fever, fast or irregular heartbeat, confusion, change in vision, trouble speaking, trouble walking Tumor lysis syndrome (TLS)--nausea, vomiting, diarrhea, decrease in the amount of urine, dark urine, unusual weakness or fatigue, confusion, muscle pain or cramps, fast or irregular heartbeat, joint pain Side effects that usually do not require medical attention (report to your care team if they continue or are bothersome): Diarrhea Fatigue Nausea Trouble sleeping This list may not describe all possible side effects. Call your doctor for medical advice about side effects. You may report side effects to FDA at 1-800-FDA-1088. Where should I keep my medication? This medication is given in a hospital or clinic. It will not be stored at home. NOTE: This sheet is a summary. It may not cover all possible information. If you have questions about this medicine, talk to your doctor, pharmacist, or health care provider.  2023 Elsevier/Gold Standard (2021-12-07 00:00:00)

## 2022-03-24 ENCOUNTER — Inpatient Hospital Stay: Payer: Medicare (Managed Care)

## 2022-03-24 ENCOUNTER — Inpatient Hospital Stay (HOSPITAL_BASED_OUTPATIENT_CLINIC_OR_DEPARTMENT_OTHER): Payer: Medicare (Managed Care) | Admitting: Hematology and Oncology

## 2022-03-24 ENCOUNTER — Inpatient Hospital Stay: Payer: Medicare (Managed Care) | Attending: Physician Assistant

## 2022-03-24 ENCOUNTER — Other Ambulatory Visit: Payer: Self-pay

## 2022-03-24 VITALS — BP 116/91 | HR 70 | Temp 98.2°F | Resp 16

## 2022-03-24 VITALS — BP 120/74 | HR 81 | Temp 98.5°F | Resp 15

## 2022-03-24 DIAGNOSIS — C9002 Multiple myeloma in relapse: Secondary | ICD-10-CM

## 2022-03-24 DIAGNOSIS — Z5111 Encounter for antineoplastic chemotherapy: Secondary | ICD-10-CM

## 2022-03-24 DIAGNOSIS — E119 Type 2 diabetes mellitus without complications: Secondary | ICD-10-CM | POA: Diagnosis not present

## 2022-03-24 DIAGNOSIS — G8220 Paraplegia, unspecified: Secondary | ICD-10-CM | POA: Diagnosis not present

## 2022-03-24 DIAGNOSIS — Z7189 Other specified counseling: Secondary | ICD-10-CM

## 2022-03-24 DIAGNOSIS — Z7901 Long term (current) use of anticoagulants: Secondary | ICD-10-CM | POA: Insufficient documentation

## 2022-03-24 DIAGNOSIS — Z86718 Personal history of other venous thrombosis and embolism: Secondary | ICD-10-CM | POA: Diagnosis not present

## 2022-03-24 LAB — CMP (CANCER CENTER ONLY)
ALT: 23 U/L (ref 0–44)
AST: 12 U/L — ABNORMAL LOW (ref 15–41)
Albumin: 3.9 g/dL (ref 3.5–5.0)
Alkaline Phosphatase: 102 U/L (ref 38–126)
Anion gap: 2 — ABNORMAL LOW (ref 5–15)
BUN: 14 mg/dL (ref 8–23)
CO2: 29 mmol/L (ref 22–32)
Calcium: 9.6 mg/dL (ref 8.9–10.3)
Chloride: 111 mmol/L (ref 98–111)
Creatinine: 0.76 mg/dL (ref 0.61–1.24)
GFR, Estimated: >60 mL/min (ref >60)
Glucose, Bld: 100 mg/dL — ABNORMAL HIGH (ref 70–99)
Potassium: 3.9 mmol/L (ref 3.5–5.1)
Sodium: 142 mmol/L (ref 135–145)
Total Bilirubin: 0.8 mg/dL (ref 0.3–1.2)
Total Protein: 6.3 g/dL — ABNORMAL LOW (ref 6.5–8.1)

## 2022-03-24 LAB — CBC WITH DIFFERENTIAL (CANCER CENTER ONLY)
Abs Immature Granulocytes: 0.04 10*3/uL (ref 0.00–0.07)
Basophils Absolute: 0 10*3/uL (ref 0.0–0.1)
Basophils Relative: 0 %
Eosinophils Absolute: 0.3 10*3/uL (ref 0.0–0.5)
Eosinophils Relative: 4 %
HCT: 31.1 % — ABNORMAL LOW (ref 39.0–52.0)
Hemoglobin: 11.2 g/dL — ABNORMAL LOW (ref 13.0–17.0)
Immature Granulocytes: 1 %
Lymphocytes Relative: 12 %
Lymphs Abs: 1 10*3/uL (ref 0.7–4.0)
MCH: 30.9 pg (ref 26.0–34.0)
MCHC: 36 g/dL (ref 30.0–36.0)
MCV: 85.9 fL (ref 80.0–100.0)
Monocytes Absolute: 0.9 10*3/uL (ref 0.1–1.0)
Monocytes Relative: 11 %
Neutro Abs: 5.7 10*3/uL (ref 1.7–7.7)
Neutrophils Relative %: 72 %
Platelet Count: 176 10*3/uL (ref 150–400)
RBC: 3.62 MIL/uL — ABNORMAL LOW (ref 4.22–5.81)
RDW: 15.9 % — ABNORMAL HIGH (ref 11.5–15.5)
WBC Count: 8 10*3/uL (ref 4.0–10.5)
nRBC: 0 % (ref 0.0–0.2)

## 2022-03-24 LAB — URIC ACID: Uric Acid, Serum: 6 mg/dL (ref 3.7–8.6)

## 2022-03-24 LAB — LACTATE DEHYDROGENASE: LDH: 116 U/L (ref 98–192)

## 2022-03-24 MED ORDER — SODIUM CHLORIDE 0.9 % IV SOLN: Freq: Once | INTRAVENOUS | Status: AC

## 2022-03-24 MED ORDER — SODIUM CHLORIDE 0.9% FLUSH: 10.0000 mL | INTRAVENOUS | Status: DC | PRN

## 2022-03-24 MED ORDER — DEXTROSE 5 % IV SOLN
70.0000 mg/m2 | Freq: Once | INTRAVENOUS | Status: AC
  Administered 2022-03-24: 150 mg via INTRAVENOUS
  Filled 2022-03-24: qty 60

## 2022-03-24 MED ORDER — SODIUM CHLORIDE 0.9 % IV SOLN
40.0000 mg | Freq: Once | INTRAVENOUS | Status: AC
  Administered 2022-03-24: 40 mg via INTRAVENOUS
  Filled 2022-03-24: qty 4

## 2022-03-24 MED ORDER — HEPARIN SOD (PORK) LOCK FLUSH 100 UNIT/ML IV SOLN: 500.0000 [IU] | Freq: Once | INTRAVENOUS | Status: DC | PRN

## 2022-03-24 NOTE — Patient Instructions (Signed)
[facility]  Discharge Instructions: Thank you for choosing [facility] to provide your oncology and hematology care.   If you have a lab appointment with the [facility], please go directly to the [facility] and check in at the registration area.   Wear comfortable clothing and clothing appropriate for easy access to any Portacath or PICC line.   We strive to give you quality time with your provider. You may need to reschedule your appointment if you arrive late (15 or more minutes).  Arriving late affects you and other patients whose appointments are after yours.  Also, if you miss three or more appointments without notifying the office, you may be dismissed from the clinic at the provider's discretion.      For prescription refill requests, have your pharmacy contact our office and allow 72 hours for refills to be completed.    Today you received the following chemotherapy and/or immunotherapy agents : Kyprolis      To help prevent nausea and vomiting after your treatment, we encourage you to take your nausea medication as directed.  BELOW ARE SYMPTOMS THAT SHOULD BE REPORTED IMMEDIATELY: *FEVER GREATER THAN 100.4 F (38 C) OR HIGHER *CHILLS OR SWEATING *NAUSEA AND VOMITING THAT IS NOT CONTROLLED WITH YOUR NAUSEA MEDICATION *UNUSUAL SHORTNESS OF BREATH *UNUSUAL BRUISING OR BLEEDING *URINARY PROBLEMS (pain or burning when urinating, or frequent urination) *BOWEL PROBLEMS (unusual diarrhea, constipation, pain near the anus) TENDERNESS IN MOUTH AND THROAT WITH OR WITHOUT PRESENCE OF ULCERS (sore throat, sores in mouth, or a toothache) UNUSUAL RASH, SWELLING OR PAIN  UNUSUAL VAGINAL DISCHARGE OR ITCHING   Items with * indicate a potential emergency and should be followed up as soon as possible or go to the Emergency Department if any problems should occur.  Please show the CHEMOTHERAPY ALERT CARD or IMMUNOTHERAPY ALERT CARD at check-in to  the Emergency Department and triage nurse.  Should you have questions after your visit or need to cancel or reschedule your appointment, please contact [facility]  Dept: [phone]  and follow the prompts.  Office hours are 8:00 a.m. to 4:30 p.m. Monday - Friday. Please note that voicemails left after 4:00 p.m. may not be returned until the following business day.  We are closed weekends and major holidays. You have access to a nurse at all times for urgent questions. Please call the main number to the clinic Dept: [phone] and follow the prompts.   For any non-urgent questions, you may also contact your provider using MyChart. We now offer e-Visits for anyone 19 and older to request care online for non-urgent symptoms. For details visit [email or web address].   Also download the MyChart app! Go to the app store, search "MyChart", open the app, select [facility], and log in with your MyChart username and password.  Masks are optional in the cancer centers. If you would like for your care team to wear a mask while they are taking care of you, please let them know. You may have one support person who is at least 68 years old accompany you for your appointments.

## 2022-03-26 ENCOUNTER — Other Ambulatory Visit: Payer: Self-pay | Admitting: Hematology and Oncology

## 2022-03-26 DIAGNOSIS — C9002 Multiple myeloma in relapse: Secondary | ICD-10-CM

## 2022-03-26 NOTE — Progress Notes (Signed)
ON PATHWAY REGIMEN - Multiple Myeloma and Other Plasma Cell Dyscrasias  No Change  Continue With Treatment as Ordered.  Original Decision Date/Time: 01/03/2022 17:10     A cycle is every 28 days:     Carfilzomib      Carfilzomib      Carfilzomib      Dexamethasone      Dexamethasone   **Always confirm dose/schedule in your pharmacy ordering system**  Patient Characteristics: Multiple Myeloma, Relapsed / Refractory, Second through [place] of Therapy, Fit or Candidate for Triplet Therapy, Lenalidomide-Refractory or Lenalidomide-based Regimen Not Preferred, Not a Candidate for Anti-CD38 Antibody Disease Classification: Multiple Myeloma R-ISS Staging: II Therapeutic Status: Relapsed Line of Therapy: Third Line Anti-CD38 Antibody Candidacy: Not a Candidate for Anti-CD38 Antibody Lenalidomide-based Regimen Preference/Candidacy: Lenalidomide-based Regimen Not Preferred Intent of Therapy: Non-Curative / Palliative Intent, Discussed with Patient

## 2022-03-27 ENCOUNTER — Encounter: Payer: Self-pay | Admitting: Hematology and Oncology

## 2022-03-27 NOTE — Progress Notes (Signed)
[place] [phone]   [phone]  PROGRESS NOTE  Patient Care Team: [name], MD as PCP - General (Family Medicine) [name], MD as Consulting Physician (Physical Medicine and Rehabilitation) Donia Guiles Lavon Paganini, PA-C as Physician Assistant (Physical Medicine and Rehabilitation) Orson Slick, MD as Consulting Physician (Hematology and Oncology)  Hematological/Oncological History # IgG Lambda Multiple Myeloma, Relapsed (ISS Stage II) 1) 06/2010: initial diagnosis of Multiple Myeloma after T8 compression fracture. Treated with Velcade/Revlimid/Dexamethasone and achieved a complete remission 2) Velcade was discontinued in September 2012 and that Revlimid and Decadron were discontinued in March 2013. 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 4) Followed by Dr. Alvy Bimler, last clinic visit 10/09/2019. At that time there was concern for relapse of his multiple myeloma.  5) Patient requested transfer to different provider after misunderstanding regarding imaging studies 6) 12/17/2019: transfer care to Dr. Lorenso Courier  7) 01/09/2020: Cycle 1 Day 1 of Dara/Velcade/Dex 8) 01/21/2020: presented as urgent visit for diarrhea and dehydration. Holding chemotherapy scheduled for 01/23/2020. 9) 01/30/2020: Resume dara/velcade/dex after resolution of diarrhea.  10) 02/13/2020: restaging labs show M protein 0.8, Kappa 4.5, lamba 17.2, ratio 0.26, urine M protein 53 (7.1%). All MM labs indicate improvement.  11) 03/10/2020: Cycle 4 Day 1 of Dara/Velcade/Dex. Transition to q 3 week daratumumab.  12) 06/04/2020:  Cycle 8 Day 1 of Dara/Velcade/Dex 13) 06/25/2020:  Cycle 9 Day 1 of Dara/Velcade/Dex 14) 07/22/2020: Cycle 10 Day 1 of  Dara//Dex 15) 08/18/2020: Cycle 11 Day 1 of Dara//Dex 16) 09/23/2020: Cycle 12 Day 1 of Dara//Dex 17) 10/22/2020: Cycle 13 Day 1 of Dara/Dex   18) 11/19/2020: Cycle 14 Day 1 of Dara/Dex  19) 12/17/2020: Cycle 15 Day 1 of Dara/Dex  20) 01/17/2021: Cycle 16 Day 1 of Dara/Dex  21) 02/11/2021: Cycle 17 Day 1 of Dara/Dex 22) 03/10/2021: Cycle 18 Day 1 of Dara/Dex  23) 04/08/2021: Cycle 19 Day 1 of Dara/Dex  24) 08/03/2021: Cycle 20 Day 1 of Dara/Dex (delayed due to scheduling error) 25) 09/02/2021: Cycle 21 Day 1 of Dara/Dex 26) 09/30/2021: Cycle 22 Day 1 of Dara/Dex 27) 10/28/2021: Cycle 23 Day 1 of Dara/Dex 28) 12/02/2021: Cycle 24 Day 1 of Dara/Dex 29) 12/30/2021: Cycle 25 Day 1 of Dara/Dex 30) 01/06/2022: Cycle 1 Day 1 of Kyprolis/Dex 31) 02/03/2022: Cycle 2 Day 1 of Kyprolis/Dex 32) 02/24/2022: Cycle 3 Day 1 of Kyprolis/Dex 33) 04/07/2022: anticipated Cycle 4 Day 1 of Kyprolis/Dex  Interval History:  [name] 66 y.o. male with medical history significant for  IgG Lambda Multiple Myeloma who presents for a follow up visit. The patient's last visit was on 03/10/2022.  He presents today to for cycle 3 day 15 of Kyprolis/Dex.   On exam today [name] reports the treatments have been going quite well.  He reports that he is not having any stomach upset though his appetite has been lower than usual.  He is not having any nausea, ming, or diarrhea.  He reports he is doing his best intake water and is drinking about 40 ounces of water twice daily.  He notes that he is otherwise quite well with no major side effects as result of his treatment.  He denies any numbness or tingling of his fingers or toes.  He has no other complaints.A full 10 point ROS is listed below.  MEDICAL HISTORY:  Past Medical History:  Diagnosis Date  Adrenal insufficiency (HCC)    on chronic dexamethasone   Anemia    Cancer (HCC)    Coagulopathy ([place])    on xeralto/ s/p DVT while on coumadin,  IVC in place   Diabetes mellitus without complication ([place])    type 2   Gross hematuria 7/14   post foley cath procedure   History of blood transfusion 7/14   Multiple  myeloma    thoracic T8 with paraplegia s/p resection- on chemo at visit 10/13/10   Multiple myeloma    Multiple myeloma without mention of remission    Neurogenic bladder    Neurogenic bowel    Paraplegia ([place])    Partial small bowel obstruction ([facility]) during dec 2011 admission    SURGICAL HISTORY: Past Surgical History:  Procedure Laterality Date   COLONOSCOPY WITH PROPOFOL N/A 04/12/2017   Procedure: COLONOSCOPY WITH PROPOFOL;  Surgeon: Irene Shipper, MD;  Location: [facility];  Service: Endoscopy;  Laterality: N/A;   COLONOSCOPY WITH PROPOFOL N/A 04/19/2017   Procedure: COLONOSCOPY WITH PROPOFOL;  Surgeon: Yetta Flock, MD;  Location: [facility];  Service: Gastroenterology;  Laterality: N/A;   COLOSTOMY  07/20/2011   Procedure: COLOSTOMY;  Surgeon: Judieth Keens, DO;  Location: [facility];  Service: General;;   COLOSTOMY REVISION  07/20/2011   Procedure: COLON RESECTION SIGMOID;  Surgeon: Judieth Keens, DO;  Location: [facility];  Service: General;;   CYSTOSCOPY N/A 04/04/2013   Procedure: CYSTOSCOPY WITH LITHALOPAXY;  Surgeon: Alexis Frock, MD;  Location: [facility];  Service: Urology;  Laterality: N/A;   INSERTION OF SUPRAPUBIC CATHETER N/A 04/04/2013   Procedure: INSERTION OF SUPRAPUBIC CATHETER;  Surgeon: Alexis Frock, MD;  Location: [facility];  Service: Urology;  Laterality: N/A;   LAPAROTOMY  07/20/2011   Procedure: EXPLORATORY LAPAROTOMY;  Surgeon: Judieth Keens, DO;  Location: [facility];  Service: General;  Laterality: N/A;   myeloma thoracic T8 with parpaplegia s/p thoracotomy and thoracic T7-9 cage placement on Dec 26th 2011  07/18/10    SOCIAL HISTORY: Social History   Socioeconomic History   Marital status: Married    Spouse name: Not on file   Number of children: Not on file   Years of education: Not on file   Highest education level: Not on file  Occupational History   Not on file  Tobacco Use   Smoking status: Never   Smokeless tobacco: Never  Vaping  Use   Vaping Use: Never used  Substance and Sexual Activity   Alcohol use: No   Drug use: No   Sexual activity: Never  Other Topics Concern   Not on file  Social History Narrative   Not on file   Social Determinants of Health   Financial Resource Strain: Not on file  Food Insecurity: Not on file  Transportation Needs: Not on file  Physical Activity: Not on file  Stress: Not on file  Social Connections: Not on file  Intimate Partner Violence: Not on file    FAMILY HISTORY: Family History  Problem Relation Age of Onset   Ovarian cancer Mother    Diabetes Father     ALLERGIES:  is allergic to feraheme [ferumoxytol].  MEDICATIONS:  Current Outpatient Medications  Medication Sig Dispense Refill   acyclovir (ZOVIRAX) 400 MG tablet Take 1 tablet (400 mg total) by mouth 2 (two) times daily. 60 tablet 2   atorvastatin (LIPITOR) 10 MG tablet Take 10 mg by mouth daily.     baclofen (LIORESAL) 20 MG tablet  Take 20 mg by mouth 2 (two) times daily.     diazepam (DIASTAT ACUDIAL) 10 MG GEL SMARTSIG:By Mouth     dorzolamide (TRUSOPT) 2 % ophthalmic solution 1 drop 2 (two) times daily.     iron polysaccharides (NU-IRON) 150 MG capsule Take 1 capsule (150 mg total) by mouth daily. 30 capsule 0   latanoprost (XALATAN) 0.005 % ophthalmic solution Place 1 drop into both eyes at bedtime.     ondansetron (ZOFRAN) 8 MG tablet Take 1 tablet (8 mg total) by mouth every 8 (eight) hours as needed for nausea or vomiting. 30 tablet 0   rivaroxaban (XARELTO) 10 MG TABS tablet Take 1 tablet (10 mg total) by mouth daily with supper. 30 tablet 9   Skin Protectants, Misc. (EUCERIN) cream Apply 1 application topically 2 (two) times daily as needed for dry skin.      sulfamethoxazole-trimethoprim (BACTRIM DS) 800-160 MG tablet Take 1 tablet by mouth 2 (two) times daily.     zinc oxide (BALMEX) 11.3 % CREA cream Apply 1 application topically 2 (two) times daily.     No current facility-administered  medications for this visit.   Facility-Administered Medications Ordered in Other Visits  Medication Dose Route Frequency Provider Last Rate Last Admin   sodium chloride flush (NS) 0.9 % injection 10 mL  10 mL Intravenous PRN Alvy Bimler, Ni, MD        REVIEW OF SYSTEMS:   Constitutional: ( - ) fevers, ( - )  chills , ( - ) night sweats Eyes: ( - ) blurriness of vision, ( - ) double vision, ( - ) watery eyes Ears, nose, mouth, throat, and face: ( - ) mucositis, ( - ) sore throat Respiratory: ( - ) cough, ( - ) dyspnea, ( - ) wheezes Cardiovascular: ( - ) palpitation, ( - ) chest discomfort, ( - ) lower extremity swelling Gastrointestinal:  ( - ) nausea, ( - ) heartburn, ( - ) change in bowel habits Skin: ( - ) abnormal skin rashes Lymphatics: ( - ) new lymphadenopathy, ( - ) easy bruising Neurological: ( - ) numbness, ( - ) tingling, ( - ) new weaknesses Behavioral/Psych: ( - ) mood change, ( - ) new changes  All other systems were reviewed with the patient and are negative.  PHYSICAL EXAMINATION: ECOG PERFORMANCE STATUS: paraplegic.   Vitals:   03/24/22 1146  BP: 120/74  Pulse: 81  Resp: 15  Temp: 98.5 F (36.9 C)  SpO2: 100%      There were no vitals filed for this visit.  GENERAL: well appearing middle aged [place] American male alert, no distress and comfortable SKIN: skin color, texture, turgor are normal, no rashes or significant lesions EYES: conjunctiva are pink and non-injected, sclera clear LUNGS: clear to auscultation and percussion with normal breathing effort HEART: regular rate & rhythm and no murmurs and no lower extremity edema Musculoskeletal: no cyanosis of digits and no clubbing  PSYCH: alert & oriented x 3, fluent speech NEURO: paraplegic, no use of LE bilaterally.   LABORATORY DATA:  I have reviewed the data as listed    Latest Ref Rng & Units 03/24/2022   11:09 AM 03/17/2022   11:47 AM 03/10/2022   11:50 AM  CBC  WBC 4.0 - 10.5 K/uL 8.0  8.0  6.3    Hemoglobin 13.0 - 17.0 g/dL 11.2  11.4  12.4   Hematocrit 39.0 - 52.0 % 31.1  31.8  35.1   Platelets 150 -  400 K/uL 176  193  474        Latest Ref Rng & Units 03/24/2022   11:09 AM 03/17/2022   11:47 AM 03/10/2022   11:50 AM  CMP  Glucose 70 - 99 mg/dL 100  93  114   BUN 8 - 23 mg/dL 14  13  10    Creatinine 0.61 - 1.24 mg/dL 0.76  0.61  0.75   Sodium 135 - 145 mmol/L 142  143  142   Potassium 3.5 - 5.1 mmol/L 3.9  3.8  4.0   Chloride 98 - 111 mmol/L 111  112  110   CO2 22 - 32 mmol/L 29  26  26    Calcium 8.9 - 10.3 mg/dL 9.6  9.5  9.8   Total Protein 6.5 - 8.1 g/dL 6.3  6.1  6.8   Total Bilirubin 0.3 - 1.2 mg/dL 0.8  0.8  0.7   Alkaline Phos 38 - 126 U/L 102  89  103   AST 15 - 41 U/L 12  10  12    ALT 0 - 44 U/L 23  19  19      Lab Results  Component Value Date   MPROTEIN 0.4 (H) 03/10/2022   MPROTEIN 0.5 (H) 02/10/2022   MPROTEIN 1.1 (H) 01/12/2022   Lab Results  Component Value Date   KPAFRELGTCHN 4.4 03/10/2022   KPAFRELGTCHN 6.3 02/10/2022   KPAFRELGTCHN 5.3 01/12/2022   LAMBDASER 12.4 03/10/2022   LAMBDASER 16.1 02/10/2022   LAMBDASER 38.4 (H) 01/12/2022   KAPLAMBRATIO 0.35 03/10/2022   KAPLAMBRATIO 0.39 02/10/2022   KAPLAMBRATIO >5.75 01/20/2022    RADIOGRAPHIC STUDIES: No results found.  ASSESSMENT & PLAN [name] 66 y.o. male with medical history significant for  IgG Lambda Multiple Myeloma who presents for a follow up visit.  After review of the labs, discussion with the patient, and reviewed the imaging his findings are most consistent with a relapsed multiple myeloma.The patient has had excellent success before with treatment of his myeloma with Velcade, Revlimid, and dexamethasone.  Transition to daratumumab therapy and was on a once monthly treatments but unfortunately developed progression of disease.  We have transitioned to Kyprolis/Dex.  # IgG Lambda Multiple Myeloma, Relapsed (ISS Stage II) --findings are most consistent with relapsed  multiple myeloma. Patient previously successfully treated with Velcade/Rev/Dex and Daratumumab/Velcade/Dex. On 07/02/2020 he transitioned to monthly daratumumab alone.  --due to to rise in M protein, switched to Kyprolis and Dexamethasone on 01/06/2022 --Most recent SPEP/IFE and sFLC from 01/12/2022 showed improving M protein measuring 1.1. K 5.3, L 38.4 and Ratio 0.14 Plan: --labs today were reviewed and adequate for treatment.  --Labs show white blood cell count 8.0, hemoglobin 11.2 MCV 84.4, and platelets of 176.  M protein dropped to 0.5 with normalization of kappa lambda ratio to 0.39 --patient will proceed with Cycle 3 Day 15 Kyprolis today.  --return for weekly Kyprolis and clinic visit  in 2 weeks.   #History of DVT --He had placement of IVC filter, remains on Xarelto. --Due to poor mobility, and lack of bleeding complications, I recommend he remain on Xarelto indefinitely. --caution if Plt count were to drop <50  # Supportive Care -- provided patient with an albuterol inhaler (for use with daratumumab) --acyclovir 414m BID for VZV prophylaxis --zofran [id number] PRN for nausea/vomiting  --Zometa can be considered after dental clearance, though with his prior episode of osteonecrosis the risk may outweigh the benefits. --patient recieved pretreatment RBC phenotype  No orders  of the defined types were placed in this encounter.   All questions were answered. The patient knows to call the clinic with any problems, questions or concerns.  I have spent a total of 25 minutes minutes of face-to-face and non-face-to-face time, preparing to see the patient,  performing a medically appropriate examination, counseling and educating the patient, communicating with other health care professionals, documenting clinical information in the electronic health record,  and care coordination.   Orson Slick, MD Department of Hematology/Oncology [facility] at [facility] Phone: [phone]  03/27/2022 2:25 PM   Literature Support:  Darrin Nipper, Petrucci MT, [place], [place], [place], [place], Spada S, [place], Ponticelli E, [place], Cavo M, Di Toritto TC, Shela Nevin F, [place] V, Palumbo A, Boccadoro M, Larocca A. Once-weekly versus twice-weekly carfilzomib in patients with newly diagnosed multiple myeloma: a pooled analysis of two phase I/II studies. Haematologica. 2019 Aug;104(8):1640-1647.  --Once-weekly 70 mg/m2 carfilzomib as induction and maintenance therapy for newly diagnosed multiple myeloma patients was as safe and effective as twice-weekly 36 mg/m2 carfilzomib and provided a more convenient schedule.

## 2022-03-31 ENCOUNTER — Inpatient Hospital Stay: Payer: Medicare (Managed Care)

## 2022-03-31 ENCOUNTER — Inpatient Hospital Stay: Payer: Medicare (Managed Care) | Admitting: Hematology and Oncology

## 2022-04-07 ENCOUNTER — Inpatient Hospital Stay: Payer: Medicare (Managed Care)

## 2022-04-07 ENCOUNTER — Other Ambulatory Visit: Payer: Self-pay

## 2022-04-07 ENCOUNTER — Inpatient Hospital Stay (HOSPITAL_BASED_OUTPATIENT_CLINIC_OR_DEPARTMENT_OTHER): Payer: Medicare (Managed Care) | Admitting: Hematology and Oncology

## 2022-04-07 VITALS — BP 127/97 | HR 83 | Temp 97.2°F | Resp 16 | Wt 256.3 lb

## 2022-04-07 DIAGNOSIS — C9002 Multiple myeloma in relapse: Secondary | ICD-10-CM

## 2022-04-07 DIAGNOSIS — Z5111 Encounter for antineoplastic chemotherapy: Secondary | ICD-10-CM | POA: Diagnosis not present

## 2022-04-07 LAB — CBC WITH DIFFERENTIAL (CANCER CENTER ONLY)
Abs Immature Granulocytes: 0.05 10*3/uL (ref 0.00–0.07)
Basophils Absolute: 0 10*3/uL (ref 0.0–0.1)
Basophils Relative: 1 %
Eosinophils Absolute: 0.3 10*3/uL (ref 0.0–0.5)
Eosinophils Relative: 5 %
HCT: 31.6 % — ABNORMAL LOW (ref 39.0–52.0)
Hemoglobin: 11 g/dL — ABNORMAL LOW (ref 13.0–17.0)
Immature Granulocytes: 1 %
Lymphocytes Relative: 14 %
Lymphs Abs: 1 10*3/uL (ref 0.7–4.0)
MCH: 30.7 pg (ref 26.0–34.0)
MCHC: 34.8 g/dL (ref 30.0–36.0)
MCV: 88.3 fL (ref 80.0–100.0)
Monocytes Absolute: 0.7 10*3/uL (ref 0.1–1.0)
Monocytes Relative: 9 %
Neutro Abs: 5.1 10*3/uL (ref 1.7–7.7)
Neutrophils Relative %: 70 %
Platelet Count: 500 10*3/uL — ABNORMAL HIGH (ref 150–400)
RBC: 3.58 MIL/uL — ABNORMAL LOW (ref 4.22–5.81)
RDW: 15.5 % (ref 11.5–15.5)
WBC Count: 7.1 10*3/uL (ref 4.0–10.5)
nRBC: 0 % (ref 0.0–0.2)

## 2022-04-07 LAB — CMP (CANCER CENTER ONLY)
ALT: 19 U/L (ref 0–44)
AST: 14 U/L — ABNORMAL LOW (ref 15–41)
Albumin: 3.7 g/dL (ref 3.5–5.0)
Alkaline Phosphatase: 88 U/L (ref 38–126)
Anion gap: 5 (ref 5–15)
BUN: 9 mg/dL (ref 8–23)
CO2: 26 mmol/L (ref 22–32)
Calcium: 9.1 mg/dL (ref 8.9–10.3)
Chloride: 112 mmol/L — ABNORMAL HIGH (ref 98–111)
Creatinine: 0.7 mg/dL (ref 0.61–1.24)
GFR, Estimated: >60 mL/min (ref >60)
Glucose, Bld: 122 mg/dL — ABNORMAL HIGH (ref 70–99)
Potassium: 3.7 mmol/L (ref 3.5–5.1)
Sodium: 143 mmol/L (ref 135–145)
Total Bilirubin: 0.6 mg/dL (ref 0.3–1.2)
Total Protein: 6.5 g/dL (ref 6.5–8.1)

## 2022-04-07 MED ORDER — SODIUM CHLORIDE 0.9 % IV SOLN
40.0000 mg | Freq: Once | INTRAVENOUS | Status: AC
  Administered 2022-04-07: 40 mg via INTRAVENOUS
  Filled 2022-04-07: qty 4

## 2022-04-07 MED ORDER — SODIUM CHLORIDE 0.9 % IV SOLN: Freq: Once | INTRAVENOUS | Status: AC

## 2022-04-07 MED ORDER — DEXTROSE 5 % IV SOLN
70.0000 mg/m2 | Freq: Once | INTRAVENOUS | Status: AC
  Administered 2022-04-07: 150 mg via INTRAVENOUS
  Filled 2022-04-07: qty 60

## 2022-04-07 NOTE — Patient Instructions (Signed)
[facility] ONCOL[facility]  Discharge Instructions: Thank you for choosing [facility] to provide your oncology and hematology care.   If you have a lab appointment with the [facility], please go directly to the [facility] and check in at the registration area.   Wear comfortable clothing and clothing appropriate for easy access to any Portacath or PICC line.   We strive to give you quality time with your provider. You may need to reschedule your appointment if you arrive late (15 or more minutes).  Arriving late affects you and other patients whose appointments are after yours.  Also, if you miss three or more appointments without notifying the office, you may be dismissed from the clinic at the provider's discretion.      For prescription refill requests, have your pharmacy contact our office and allow 72 hours for refills to be completed.    Today you received the following chemotherapy and/or immunotherapy agents: Kyprolis      To help prevent nausea and vomiting after your treatment, we encourage you to take your nausea medication as directed.  BELOW ARE SYMPTOMS THAT SHOULD BE REPORTED IMMEDIATELY: *FEVER GREATER THAN 100.4 F (38 C) OR HIGHER *CHILLS OR SWEATING *NAUSEA AND VOMITING THAT IS NOT CONTROLLED WITH YOUR NAUSEA MEDICATION *UNUSUAL SHORTNESS OF BREATH *UNUSUAL BRUISING OR BLEEDING *URINARY PROBLEMS (pain or burning when urinating, or frequent urination) *BOWEL PROBLEMS (unusual diarrhea, constipation, pain near the anus) TENDERNESS IN MOUTH AND THROAT WITH OR WITHOUT PRESENCE OF ULCERS (sore throat, sores in mouth, or a toothache) UNUSUAL RASH, SWELLING OR PAIN  UNUSUAL VAGINAL DISCHARGE OR ITCHING   Items with * indicate a potential emergency and should be followed up as soon as possible or go to the Emergency Department if any problems should occur.  Please show the CHEMOTHERAPY ALERT CARD or IMMUNOTHERAPY ALERT CARD at check-in to  the Emergency Department and triage nurse.  Should you have questions after your visit or need to cancel or reschedule your appointment, please contact [facility]  Dept: [phone]  and follow the prompts.  Office hours are 8:00 a.m. to 4:30 p.m. Monday - Friday. Please note that voicemails left after 4:00 p.m. may not be returned until the following business day.  We are closed weekends and major holidays. You have access to a nurse at all times for urgent questions. Please call the main number to the clinic Dept: [phone] and follow the prompts.   For any non-urgent questions, you may also contact your provider using MyChart. We now offer e-Visits for anyone 49 and older to request care online for non-urgent symptoms. For details visit [email or web address].   Also download the MyChart app! Go to the app store, search "MyChart", open the app, select [facility], and log in with your MyChart username and password.  Masks are optional in the cancer centers. If you would like for your care team to wear a mask while they are taking care of you, please let them know. You may have one support person who is at least 68 years old accompany you for your appointments.

## 2022-04-07 NOTE — Progress Notes (Signed)
[place] [phone]   Fax:([phone]) [place]  PROGRESS NOTE  Patient Care Team: [name], MD as PCP - General (Family Medicine) [name], MD as Consulting Physician (Physical Medicine and Rehabilitation) Donia Guiles Lavon Paganini, PA-C as Physician Assistant (Physical Medicine and Rehabilitation) Orson Slick, MD as Consulting Physician (Hematology and Oncology)  Hematological/Oncological History # IgG Lambda Multiple Myeloma, Relapsed (ISS Stage II) 1) 06/2010: initial diagnosis of Multiple Myeloma after T8 compression fracture. Treated with Velcade/Revlimid/Dexamethasone and achieved a complete remission 2) Velcade was discontinued in September 2012 and that Revlimid and Decadron were discontinued in March 2013. 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 4) Followed by Dr. Alvy Bimler, last clinic visit 10/09/2019. At that time there was concern for relapse of his multiple myeloma.  5) Patient requested transfer to different provider after misunderstanding regarding imaging studies 6) 12/17/2019: transfer care to Dr. Lorenso Courier  7) 01/09/2020: Cycle 1 Day 1 of Dara/Velcade/Dex 8) 01/21/2020: presented as urgent visit for diarrhea and dehydration. Holding chemotherapy scheduled for 01/23/2020. 9) 01/30/2020: Resume dara/velcade/dex after resolution of diarrhea.  10) 02/13/2020: restaging labs show M protein 0.8, Kappa 4.5, lamba 17.2, ratio 0.26, urine M protein 53 (7.1%). All MM labs indicate improvement.  11) 03/10/2020: Cycle 4 Day 1 of Dara/Velcade/Dex. Transition to q 3 week daratumumab.  12) 06/04/2020:  Cycle 8 Day 1 of Dara/Velcade/Dex 13) 06/25/2020:  Cycle 9 Day 1 of Dara/Velcade/Dex 14) 07/22/2020: Cycle 10 Day 1 of  Dara//Dex 15) 08/18/2020: Cycle 11 Day 1 of Dara//Dex 16) 09/23/2020: Cycle 12 Day 1 of Dara//Dex 17) 10/22/2020: Cycle 13 Day 1 of Dara/Dex   18) 11/19/2020: Cycle 14 Day 1 of Dara/Dex  19) 12/17/2020: Cycle 15 Day 1 of Dara/Dex  20) 01/17/2021: Cycle 16 Day 1 of Dara/Dex  21) 02/11/2021: Cycle 17 Day 1 of Dara/Dex 22) 03/10/2021: Cycle 18 Day 1 of Dara/Dex  23) 04/08/2021: Cycle 19 Day 1 of Dara/Dex  24) 08/03/2021: Cycle 20 Day 1 of Dara/Dex (delayed due to scheduling error) 25) 09/02/2021: Cycle 21 Day 1 of Dara/Dex 26) 09/30/2021: Cycle 22 Day 1 of Dara/Dex 27) 10/28/2021: Cycle 23 Day 1 of Dara/Dex 28) 12/02/2021: Cycle 24 Day 1 of Dara/Dex 29) 12/30/2021: Cycle 25 Day 1 of Dara/Dex 30) 01/06/2022: Cycle 1 Day 1 of Kyprolis/Dex 31) 02/03/2022: Cycle 2 Day 1 of Kyprolis/Dex 32) 02/24/2022: Cycle 3 Day 1 of Kyprolis/Dex 33) 04/07/2022: Cycle 4 Day 1 of Kyprolis/Dex  Interval History:  [name] 66 y.o. male with medical history significant for  IgG Lambda Multiple Myeloma who presents for a follow up visit. The patient's last visit was on 03/10/2022.  He presents today to for cycle 4 day 1 of Kyprolis/Dex.   On exam today [name] reports he has been well in the interim since his last visit.  He reports that he is not having any numbness or tingling of his fingers and toes.  He thinks he may be gaining weight and is concerned this may be due to fluid retention.  He reports that he is urinating more than usual.  He does have a steroids give him "left cheek" and a boost in energy.  Otherwise he is not having any fevers, chills, sweats, nausea, vomiting, diarrhea.  He is willing and able to proceed with treatment at this time.  He has no other complaints. A full 10 point ROS is listed below.  MEDICAL HISTORY:  Past Medical History:  Diagnosis Date   Adrenal insufficiency ([place])    on chronic dexamethasone   Anemia    Cancer ([place])    Coagulopathy ([place])    on xeralto/ s/p DVT while on coumadin,  IVC in place   Diabetes mellitus without complication ([place])    type 2   Gross hematuria 7/14   post foley cath procedure   History of  blood transfusion 7/14   Multiple myeloma    thoracic T8 with paraplegia s/p resection- on chemo at visit 10/13/10   Multiple myeloma    Multiple myeloma without mention of remission    Neurogenic bladder    Neurogenic bowel    Paraplegia ([place])    Partial small bowel obstruction ([place]) during dec 2011 admission    SURGICAL HISTORY: Past Surgical History:  Procedure Laterality Date   COLONOSCOPY WITH PROPOFOL N/A 04/12/2017   Procedure: COLONOSCOPY WITH PROPOFOL;  Surgeon: Irene Shipper, MD;  Location: [facility];  Service: Endoscopy;  Laterality: N/A;   COLONOSCOPY WITH PROPOFOL N/A 04/19/2017   Procedure: COLONOSCOPY WITH PROPOFOL;  Surgeon: Yetta Flock, MD;  Location: [facility];  Service: Gastroenterology;  Laterality: N/A;   COLOSTOMY  07/20/2011   Procedure: COLOSTOMY;  Surgeon: Judieth Keens, DO;  Location: [facility];  Service: General;;   COLOSTOMY REVISION  07/20/2011   Procedure: COLON RESECTION SIGMOID;  Surgeon: Judieth Keens, DO;  Location: [facility];  Service: General;;   CYSTOSCOPY N/A 04/04/2013   Procedure: CYSTOSCOPY WITH LITHALOPAXY;  Surgeon: Alexis Frock, MD;  Location: [facility];  Service: Urology;  Laterality: N/A;   INSERTION OF SUPRAPUBIC CATHETER N/A 04/04/2013   Procedure: INSERTION OF SUPRAPUBIC CATHETER;  Surgeon: Alexis Frock, MD;  Location: [facility];  Service: Urology;  Laterality: N/A;   LAPAROTOMY  07/20/2011   Procedure: EXPLORATORY LAPAROTOMY;  Surgeon: Judieth Keens, DO;  Location: [facility];  Service: General;  Laterality: N/A;   myeloma thoracic T8 with parpaplegia s/p thoracotomy and thoracic T7-9 cage placement on Dec 26th 2011  07/18/10    SOCIAL HISTORY: Social History   Socioeconomic History   Marital status: Married    Spouse name: Not on file   Number of children: Not on file   Years of education: Not on file   Highest education level: Not on file  Occupational History   Not on file  Tobacco Use   Smoking status: Never    Smokeless tobacco: Never  Vaping Use   Vaping Use: Never used  Substance and Sexual Activity   Alcohol use: No   Drug use: No   Sexual activity: Never  Other Topics Concern   Not on file  Social History Narrative   Not on file   Social Determinants of Health   Financial Resource Strain: Not on file  Food Insecurity: Not on file  Transportation Needs: Not on file  Physical Activity: Not on file  Stress: Not on file  Social Connections: Not on file  Intimate Partner Violence: Not on file    FAMILY HISTORY: Family History  Problem Relation Age of Onset   Ovarian cancer Mother    Diabetes Father     ALLERGIES:  is allergic to feraheme [ferumoxytol].  MEDICATIONS:  Current Outpatient Medications  Medication Sig Dispense Refill   acyclovir (ZOVIRAX) 400 MG tablet Take 1 tablet (400 mg total) by mouth 2 (two) times daily. 60 tablet 2   atorvastatin (LIPITOR) 10 MG tablet Take 10 mg by  mouth daily.     baclofen (LIORESAL) 20 MG tablet Take 20 mg by mouth 2 (two) times daily.     diazepam (DIASTAT ACUDIAL) 10 MG GEL SMARTSIG:By Mouth     dorzolamide (TRUSOPT) 2 % ophthalmic solution 1 drop 2 (two) times daily.     iron polysaccharides (NU-IRON) 150 MG capsule Take 1 capsule (150 mg total) by mouth daily. 30 capsule 0   latanoprost (XALATAN) 0.005 % ophthalmic solution Place 1 drop into both eyes at bedtime.     ondansetron (ZOFRAN) 8 MG tablet Take 1 tablet (8 mg total) by mouth every 8 (eight) hours as needed for nausea or vomiting. 30 tablet 0   rivaroxaban (XARELTO) 10 MG TABS tablet Take 1 tablet (10 mg total) by mouth daily with supper. 30 tablet 9   Skin Protectants, Misc. (EUCERIN) cream Apply 1 application topically 2 (two) times daily as needed for dry skin.      sulfamethoxazole-trimethoprim (BACTRIM DS) 800-160 MG tablet Take 1 tablet by mouth 2 (two) times daily.     zinc oxide (BALMEX) 11.3 % CREA cream Apply 1 application topically 2 (two) times daily.     No  current facility-administered medications for this visit.   Facility-Administered Medications Ordered in Other Visits  Medication Dose Route Frequency Provider Last Rate Last Admin   sodium chloride flush (NS) 0.9 % injection 10 mL  10 mL Intravenous PRN Alvy Bimler, Ni, MD        REVIEW OF SYSTEMS:   Constitutional: ( - ) fevers, ( - )  chills , ( - ) night sweats Eyes: ( - ) blurriness of vision, ( - ) double vision, ( - ) watery eyes Ears, nose, mouth, throat, and face: ( - ) mucositis, ( - ) sore throat Respiratory: ( - ) cough, ( - ) dyspnea, ( - ) wheezes Cardiovascular: ( - ) palpitation, ( - ) chest discomfort, ( - ) lower extremity swelling Gastrointestinal:  ( - ) nausea, ( - ) heartburn, ( - ) change in bowel habits Skin: ( - ) abnormal skin rashes Lymphatics: ( - ) new lymphadenopathy, ( - ) easy bruising Neurological: ( - ) numbness, ( - ) tingling, ( - ) new weaknesses Behavioral/Psych: ( - ) mood change, ( - ) new changes  All other systems were reviewed with the patient and are negative.  PHYSICAL EXAMINATION: ECOG PERFORMANCE STATUS: paraplegic.   Vitals:   04/07/22 1029  BP: (!) 127/97  Pulse: 83  Resp: 16  Temp: (!) 97.2 F (36.2 C)  SpO2: 96%      Filed Weights   04/07/22 1029  Weight: 256 lb 4.8 oz (116.3 kg)    GENERAL: well appearing middle aged [place] American male alert, no distress and comfortable SKIN: skin color, texture, turgor are normal, no rashes or significant lesions EYES: conjunctiva are pink and non-injected, sclera clear LUNGS: clear to auscultation and percussion with normal breathing effort HEART: regular rate & rhythm and no murmurs and no lower extremity edema Musculoskeletal: no cyanosis of digits and no clubbing  PSYCH: alert & oriented x 3, fluent speech NEURO: paraplegic, no use of LE bilaterally.   LABORATORY DATA:  I have reviewed the data as listed    Latest Ref Rng & Units 04/14/2022   11:34 AM 04/07/2022    9:25 AM  03/24/2022   11:09 AM  CBC  WBC 4.0 - 10.5 K/uL 6.9  7.1  8.0   Hemoglobin 13.0 - 17.0 g/dL  11.0  11.0  11.2   Hematocrit 39.0 - 52.0 % 32.9  31.6  31.1   Platelets 150 - 400 K/uL 198  500  176        Latest Ref Rng & Units 04/14/2022   11:34 AM 04/07/2022    9:25 AM 03/24/2022   11:09 AM  CMP  Glucose 70 - 99 mg/dL 100  122  100   BUN 8 - 23 mg/dL 11  9  14    Creatinine 0.61 - 1.24 mg/dL 0.71  0.70  0.76   Sodium 135 - 145 mmol/L 144  143  142   Potassium 3.5 - 5.1 mmol/L 4.0  3.7  3.9   Chloride 98 - 111 mmol/L 110  112  111   CO2 22 - 32 mmol/L 31  26  29    Calcium 8.9 - 10.3 mg/dL 8.8  9.1  9.6   Total Protein 6.5 - 8.1 g/dL 5.4  6.5  6.3   Total Bilirubin 0.3 - 1.2 mg/dL 0.7  0.6  0.8   Alkaline Phos 38 - 126 U/L 86  88  102   AST 15 - 41 U/L 14  14  12    ALT 0 - 44 U/L 21  19  23      Lab Results  Component Value Date   MPROTEIN 0.3 (H) 04/07/2022   MPROTEIN 0.4 (H) 03/10/2022   MPROTEIN 0.5 (H) 02/10/2022   Lab Results  Component Value Date   KPAFRELGTCHN 6.3 04/14/2022   KPAFRELGTCHN 7.6 04/07/2022   KPAFRELGTCHN 4.4 03/10/2022   LAMBDASER 7.3 04/14/2022   LAMBDASER 10.7 04/07/2022   LAMBDASER 12.4 03/10/2022   KAPLAMBRATIO 0.86 04/14/2022   KAPLAMBRATIO 0.71 04/07/2022   KAPLAMBRATIO 0.35 03/10/2022    RADIOGRAPHIC STUDIES: No results found.  ASSESSMENT & PLAN [name] 66 y.o. male with medical history significant for  IgG Lambda Multiple Myeloma who presents for a follow up visit.  After review of the labs, discussion with the patient, and reviewed the imaging his findings are most consistent with a relapsed multiple myeloma.The patient has had excellent success before with treatment of his myeloma with Velcade, Revlimid, and dexamethasone.  Transition to daratumumab therapy and was on a once monthly treatments but unfortunately developed progression of disease.  We have transitioned to Kyprolis/Dex.  # IgG Lambda Multiple Myeloma, Relapsed (ISS Stage  II) --findings are most consistent with relapsed multiple myeloma. Patient previously successfully treated with Velcade/Rev/Dex and Daratumumab/Velcade/Dex. On 07/02/2020 he transitioned to monthly daratumumab alone.  --due to to rise in M protein, switched to Kyprolis and Dexamethasone on 01/06/2022 --Most recent SPEP/IFE and sFLC from 01/12/2022 showed improving M protein measuring 1.1. K 5.3, L 38.4 and Ratio 0.14 Plan: --labs today were reviewed and adequate for treatment.  --Labs show white blood cell count 6.9, hemoglobin 11.0 MCV 91.1, and platelets of 198.  M protein dropped to 0.3 with normalization of kappa lambda ratio to 0.35 on 03/10/2022 --patient will proceed with Cycle 4 Day 1 Kyprolis today.  --return for weekly Kyprolis and clinic visit  in 2 weeks.   #History of DVT --He had placement of IVC filter, remains on Xarelto. --Due to poor mobility, and lack of bleeding complications, I recommend he remain on Xarelto indefinitely. --caution if Plt count were to drop <50  # Supportive Care -- provided patient with an albuterol inhaler (for use with daratumumab) --acyclovir 433m BID for VZV prophylaxis --zofran [id number] PRN for nausea/vomiting  --Zometa is being held  in the setting of his prior episode of osteonecrosis of the jaw --patient recieved pretreatment RBC phenotype  Orders Placed This Encounter  Procedures   Multiple Myeloma Panel (SPEP&IFE w/QIG)    Standing Status:   Future    Standing Expiration Date:   06/02/2023   Kappa/lambda light chains    Standing Status:   Future    Standing Expiration Date:   06/02/2023   CBC with Differential ([facility] Only)    Standing Status:   Future    Standing Expiration Date:   06/03/2023   CMP ([facility] only)    Standing Status:   Future    Standing Expiration Date:   06/03/2023   CBC with Differential ([facility] Only)    Standing Status:   Future    Standing Expiration Date:   06/10/2023   CMP ([facility]  only)    Standing Status:   Future    Standing Expiration Date:   06/10/2023   CBC with Differential ([facility] Only)    Standing Status:   Future    Standing Expiration Date:   06/17/2023   CMP ([facility] only)    Standing Status:   Future    Standing Expiration Date:   06/17/2023    All questions were answered. The patient knows to call the clinic with any problems, questions or concerns.  I have spent a total of 30 minutes minutes of face-to-face and non-face-to-face time, preparing to see the patient,  performing a medically appropriate examination, counseling and educating the patient, communicating with other health care professionals, documenting clinical information in the electronic health record,  and care coordination.   Orson Slick, MD Department of Hematology/Oncology [facility] at [facility] Phone: [phone]  04/19/2022 10:25 AM   Literature Support:  Darrin Nipper, Petrucci MT, [place], [place], [place], [place], Spada S, [place], Ponticelli E, [place], Cavo M, Di Toritto TC, Shela Nevin F, [place] V, Palumbo A, Boccadoro M, Larocca A. Once-weekly versus twice-weekly carfilzomib in patients with newly diagnosed multiple myeloma: a pooled analysis of two phase I/II studies. Haematologica. 2019 Aug;104(8):1640-1647.  --Once-weekly 70 mg/m2 carfilzomib as induction and maintenance therapy for newly diagnosed multiple myeloma patients was as safe and effective as twice-weekly 36 mg/m2 carfilzomib and provided a more convenient schedule.

## 2022-04-10 LAB — KAPPA/LAMBDA LIGHT CHAINS
Kappa free light chain: 7.6 mg/L (ref 3.3–19.4)
Kappa, lambda light chain ratio: 0.71 (ref 0.26–1.65)
Lambda free light chains: 10.7 mg/L (ref 5.7–26.3)

## 2022-04-13 LAB — MULTIPLE MYELOMA PANEL, SERUM
Albumin SerPl Elph-Mcnc: 3.4 g/dL (ref 2.9–4.4)
Albumin/Glob SerPl: 1.5 (ref 0.7–1.7)
Alpha 1: 0.3 g/dL (ref 0.0–0.4)
Alpha2 Glob SerPl Elph-Mcnc: 0.8 g/dL (ref 0.4–1.0)
B-Globulin SerPl Elph-Mcnc: 0.9 g/dL (ref 0.7–1.3)
Gamma Glob SerPl Elph-Mcnc: 0.5 g/dL (ref 0.4–1.8)
Globulin, Total: 2.4 g/dL (ref 2.2–3.9)
IgA: 21 mg/dL — ABNORMAL LOW (ref 61–437)
IgG (Immunoglobin G), Serum: 591 mg/dL — ABNORMAL LOW (ref 603–1613)
IgM (Immunoglobulin M), Srm: 8 mg/dL — ABNORMAL LOW (ref 20–172)
M Protein SerPl Elph-Mcnc: 0.3 g/dL — ABNORMAL HIGH
Total Protein ELP: 5.8 g/dL — ABNORMAL LOW (ref 6.0–8.5)

## 2022-04-14 ENCOUNTER — Other Ambulatory Visit: Payer: Medicare (Managed Care)

## 2022-04-14 ENCOUNTER — Inpatient Hospital Stay: Payer: Medicare (Managed Care)

## 2022-04-14 ENCOUNTER — Other Ambulatory Visit: Payer: Self-pay

## 2022-04-14 ENCOUNTER — Ambulatory Visit: Payer: Medicare (Managed Care)

## 2022-04-14 VITALS — BP 110/68 | HR 62 | Temp 97.8°F | Resp 17

## 2022-04-14 DIAGNOSIS — C9002 Multiple myeloma in relapse: Secondary | ICD-10-CM

## 2022-04-14 DIAGNOSIS — Z5111 Encounter for antineoplastic chemotherapy: Secondary | ICD-10-CM | POA: Diagnosis not present

## 2022-04-14 LAB — CBC WITH DIFFERENTIAL (CANCER CENTER ONLY)
Abs Immature Granulocytes: 0.04 10*3/uL (ref 0.00–0.07)
Basophils Absolute: 0 10*3/uL (ref 0.0–0.1)
Basophils Relative: 0 %
Eosinophils Absolute: 0.4 10*3/uL (ref 0.0–0.5)
Eosinophils Relative: 6 %
HCT: 32.9 % — ABNORMAL LOW (ref 39.0–52.0)
Hemoglobin: 11 g/dL — ABNORMAL LOW (ref 13.0–17.0)
Immature Granulocytes: 1 %
Lymphocytes Relative: 11 %
Lymphs Abs: 0.7 10*3/uL (ref 0.7–4.0)
MCH: 30.5 pg (ref 26.0–34.0)
MCHC: 33.4 g/dL (ref 30.0–36.0)
MCV: 91.1 fL (ref 80.0–100.0)
Monocytes Absolute: 0.7 10*3/uL (ref 0.1–1.0)
Monocytes Relative: 10 %
Neutro Abs: 5.1 10*3/uL (ref 1.7–7.7)
Neutrophils Relative %: 72 %
Platelet Count: 198 10*3/uL (ref 150–400)
RBC: 3.61 MIL/uL — ABNORMAL LOW (ref 4.22–5.81)
RDW: 16.2 % — ABNORMAL HIGH (ref 11.5–15.5)
WBC Count: 6.9 10*3/uL (ref 4.0–10.5)
nRBC: 0 % (ref 0.0–0.2)

## 2022-04-14 LAB — CMP (CANCER CENTER ONLY)
ALT: 21 U/L (ref 0–44)
AST: 14 U/L — ABNORMAL LOW (ref 15–41)
Albumin: 3.6 g/dL (ref 3.5–5.0)
Alkaline Phosphatase: 86 U/L (ref 38–126)
Anion gap: 3 — ABNORMAL LOW (ref 5–15)
BUN: 11 mg/dL (ref 8–23)
CO2: 31 mmol/L (ref 22–32)
Calcium: 8.8 mg/dL — ABNORMAL LOW (ref 8.9–10.3)
Chloride: 110 mmol/L (ref 98–111)
Creatinine: 0.71 mg/dL (ref 0.61–1.24)
GFR, Estimated: >60 mL/min (ref >60)
Glucose, Bld: 100 mg/dL — ABNORMAL HIGH (ref 70–99)
Potassium: 4 mmol/L (ref 3.5–5.1)
Sodium: 144 mmol/L (ref 135–145)
Total Bilirubin: 0.7 mg/dL (ref 0.3–1.2)
Total Protein: 5.4 g/dL — ABNORMAL LOW (ref 6.5–8.1)

## 2022-04-14 LAB — LACTATE DEHYDROGENASE: LDH: 128 U/L (ref 98–192)

## 2022-04-14 LAB — URIC ACID: Uric Acid, Serum: 5.6 mg/dL (ref 3.7–8.6)

## 2022-04-14 MED ORDER — SODIUM CHLORIDE 0.9 % IV SOLN
40.0000 mg | Freq: Once | INTRAVENOUS | Status: AC
  Administered 2022-04-14: 40 mg via INTRAVENOUS
  Filled 2022-04-14: qty 4

## 2022-04-14 MED ORDER — SODIUM CHLORIDE 0.9 % IV SOLN: Freq: Once | INTRAVENOUS | Status: AC

## 2022-04-14 MED ORDER — SODIUM CHLORIDE 0.9 % IV SOLN: Freq: Once | INTRAVENOUS | Status: DC

## 2022-04-14 MED ORDER — DEXTROSE 5 % IV SOLN
70.0000 mg/m2 | Freq: Once | INTRAVENOUS | Status: AC
  Administered 2022-04-14: 150 mg via INTRAVENOUS
  Filled 2022-04-14: qty 60

## 2022-04-14 NOTE — Patient Instructions (Signed)
[facility]  Discharge Instructions: Thank you for choosing [facility] to provide your oncology and hematology care.   If you have a lab appointment with the [facility], please go directly to the [facility] and check in at the registration area.   Wear comfortable clothing and clothing appropriate for easy access to any Portacath or PICC line.   We strive to give you quality time with your provider. You may need to reschedule your appointment if you arrive late (15 or more minutes).  Arriving late affects you and other patients whose appointments are after yours.  Also, if you miss three or more appointments without notifying the office, you may be dismissed from the clinic at the provider's discretion.      For prescription refill requests, have your pharmacy contact our office and allow 72 hours for refills to be completed.    Today you received the following chemotherapy and/or immunotherapy agents: carfilzomib      To help prevent nausea and vomiting after your treatment, we encourage you to take your nausea medication as directed.  BELOW ARE SYMPTOMS THAT SHOULD BE REPORTED IMMEDIATELY: *FEVER GREATER THAN 100.4 F (38 C) OR HIGHER *CHILLS OR SWEATING *NAUSEA AND VOMITING THAT IS NOT CONTROLLED WITH YOUR NAUSEA MEDICATION *UNUSUAL SHORTNESS OF BREATH *UNUSUAL BRUISING OR BLEEDING *URINARY PROBLEMS (pain or burning when urinating, or frequent urination) *BOWEL PROBLEMS (unusual diarrhea, constipation, pain near the anus) TENDERNESS IN MOUTH AND THROAT WITH OR WITHOUT PRESENCE OF ULCERS (sore throat, sores in mouth, or a toothache) UNUSUAL RASH, SWELLING OR PAIN  UNUSUAL VAGINAL DISCHARGE OR ITCHING   Items with * indicate a potential emergency and should be followed up as soon as possible or go to the Emergency Department if any problems should occur.  Please show the CHEMOTHERAPY ALERT CARD or IMMUNOTHERAPY ALERT CARD at check-in  to the Emergency Department and triage nurse.  Should you have questions after your visit or need to cancel or reschedule your appointment, please contact [facility]  Dept: [phone]  and follow the prompts.  Office hours are 8:00 a.m. to 4:30 p.m. Monday - Friday. Please note that voicemails left after 4:00 p.m. may not be returned until the following business day.  We are closed weekends and major holidays. You have access to a nurse at all times for urgent questions. Please call the main number to the clinic Dept: [phone] and follow the prompts.   For any non-urgent questions, you may also contact your provider using MyChart. We now offer e-Visits for anyone 42 and older to request care online for non-urgent symptoms. For details visit [email or web address].   Also download the MyChart app! Go to the app store, search "MyChart", open the app, select [facility], and log in with your MyChart username and password.  Masks are optional in the cancer centers. If you would like for your care team to wear a mask while they are taking care of you, please let them know. You may have one support person who is at least 68 years old accompany you for your appointments.

## 2022-04-17 LAB — KAPPA/LAMBDA LIGHT CHAINS
Kappa free light chain: 6.3 mg/L (ref 3.3–19.4)
Kappa, lambda light chain ratio: 0.86 (ref 0.26–1.65)
Lambda free light chains: 7.3 mg/L (ref 5.7–26.3)

## 2022-04-19 ENCOUNTER — Encounter: Payer: Self-pay | Admitting: Hematology and Oncology

## 2022-04-20 LAB — MULTIPLE MYELOMA PANEL, SERUM
Albumin SerPl Elph-Mcnc: 3.1 g/dL (ref 2.9–4.4)
Albumin/Glob SerPl: 1.4 (ref 0.7–1.7)
Alpha 1: 0.3 g/dL (ref 0.0–0.4)
Alpha2 Glob SerPl Elph-Mcnc: 0.8 g/dL (ref 0.4–1.0)
B-Globulin SerPl Elph-Mcnc: 0.8 g/dL (ref 0.7–1.3)
Gamma Glob SerPl Elph-Mcnc: 0.4 g/dL (ref 0.4–1.8)
Globulin, Total: 2.3 g/dL (ref 2.2–3.9)
IgA: 18 mg/dL — ABNORMAL LOW (ref 61–437)
IgG (Immunoglobin G), Serum: 535 mg/dL — ABNORMAL LOW (ref 603–1613)
IgM (Immunoglobulin M), Srm: 9 mg/dL — ABNORMAL LOW (ref 20–172)
M Protein SerPl Elph-Mcnc: 0.2 g/dL — ABNORMAL HIGH
Total Protein ELP: 5.4 g/dL — ABNORMAL LOW (ref 6.0–8.5)

## 2022-04-21 ENCOUNTER — Other Ambulatory Visit: Payer: Medicare (Managed Care)

## 2022-04-21 ENCOUNTER — Ambulatory Visit: Payer: Medicare (Managed Care) | Admitting: Physician Assistant

## 2022-04-21 ENCOUNTER — Other Ambulatory Visit: Payer: Self-pay

## 2022-04-21 ENCOUNTER — Inpatient Hospital Stay: Payer: Medicare (Managed Care)

## 2022-04-21 ENCOUNTER — Inpatient Hospital Stay (HOSPITAL_BASED_OUTPATIENT_CLINIC_OR_DEPARTMENT_OTHER): Payer: Medicare (Managed Care) | Admitting: Physician Assistant

## 2022-04-21 ENCOUNTER — Ambulatory Visit: Payer: Medicare (Managed Care)

## 2022-04-21 VITALS — BP 132/69 | HR 77 | Resp 16

## 2022-04-21 VITALS — BP 125/77 | HR 93 | Temp 97.5°F | Resp 16 | Wt 264.3 lb

## 2022-04-21 DIAGNOSIS — C9002 Multiple myeloma in relapse: Secondary | ICD-10-CM

## 2022-04-21 DIAGNOSIS — R0683 Snoring: Secondary | ICD-10-CM | POA: Diagnosis not present

## 2022-04-21 DIAGNOSIS — Z5111 Encounter for antineoplastic chemotherapy: Secondary | ICD-10-CM | POA: Diagnosis not present

## 2022-04-21 LAB — URIC ACID: Uric Acid, Serum: 5.4 mg/dL (ref 3.7–8.6)

## 2022-04-21 LAB — CBC WITH DIFFERENTIAL (CANCER CENTER ONLY)
Abs Immature Granulocytes: 0.05 10*3/uL (ref 0.00–0.07)
Basophils Absolute: 0 10*3/uL (ref 0.0–0.1)
Basophils Relative: 0 %
Eosinophils Absolute: 0.4 10*3/uL (ref 0.0–0.5)
Eosinophils Relative: 5 %
HCT: 30.2 % — ABNORMAL LOW (ref 39.0–52.0)
Hemoglobin: 10.5 g/dL — ABNORMAL LOW (ref 13.0–17.0)
Immature Granulocytes: 1 %
Lymphocytes Relative: 13 %
Lymphs Abs: 0.9 10*3/uL (ref 0.7–4.0)
MCH: 30 pg (ref 26.0–34.0)
MCHC: 34.8 g/dL (ref 30.0–36.0)
MCV: 86.3 fL (ref 80.0–100.0)
Monocytes Absolute: 0.7 10*3/uL (ref 0.1–1.0)
Monocytes Relative: 9 %
Neutro Abs: 5.3 10*3/uL (ref 1.7–7.7)
Neutrophils Relative %: 72 %
Platelet Count: 215 10*3/uL (ref 150–400)
RBC: 3.5 MIL/uL — ABNORMAL LOW (ref 4.22–5.81)
RDW: 15.8 % — ABNORMAL HIGH (ref 11.5–15.5)
WBC Count: 7.3 10*3/uL (ref 4.0–10.5)
nRBC: 0 % (ref 0.0–0.2)

## 2022-04-21 LAB — CMP (CANCER CENTER ONLY)
ALT: 29 U/L (ref 0–44)
AST: 11 U/L — ABNORMAL LOW (ref 15–41)
Albumin: 3.6 g/dL (ref 3.5–5.0)
Alkaline Phosphatase: 96 U/L (ref 38–126)
Anion gap: 3 — ABNORMAL LOW (ref 5–15)
BUN: 11 mg/dL (ref 8–23)
CO2: 27 mmol/L (ref 22–32)
Calcium: 8.9 mg/dL (ref 8.9–10.3)
Chloride: 113 mmol/L — ABNORMAL HIGH (ref 98–111)
Creatinine: 0.72 mg/dL (ref 0.61–1.24)
GFR, Estimated: >60 mL/min (ref >60)
Glucose, Bld: 153 mg/dL — ABNORMAL HIGH (ref 70–99)
Potassium: 3.8 mmol/L (ref 3.5–5.1)
Sodium: 143 mmol/L (ref 135–145)
Total Bilirubin: 0.5 mg/dL (ref 0.3–1.2)
Total Protein: 6 g/dL — ABNORMAL LOW (ref 6.5–8.1)

## 2022-04-21 LAB — LACTATE DEHYDROGENASE: LDH: 128 U/L (ref 98–192)

## 2022-04-21 MED ORDER — SODIUM CHLORIDE 0.9 % IV SOLN: Freq: Once | INTRAVENOUS | Status: AC

## 2022-04-21 MED ORDER — SODIUM CHLORIDE 0.9 % IV SOLN
40.0000 mg | Freq: Once | INTRAVENOUS | Status: AC
  Administered 2022-04-21: 40 mg via INTRAVENOUS
  Filled 2022-04-21: qty 4

## 2022-04-21 MED ORDER — DEXTROSE 5 % IV SOLN
70.0000 mg/m2 | Freq: Once | INTRAVENOUS | Status: AC
  Administered 2022-04-21: 150 mg via INTRAVENOUS
  Filled 2022-04-21: qty 15

## 2022-04-21 NOTE — Patient Instructions (Signed)
[facility]  Discharge Instructions: Thank you for choosing [facility] to provide your oncology and hematology care.   If you have a lab appointment with the [facility], please go directly to the [facility] and check in at the registration area.   Wear comfortable clothing and clothing appropriate for easy access to any Portacath or PICC line.   We strive to give you quality time with your provider. You may need to reschedule your appointment if you arrive late (15 or more minutes).  Arriving late affects you and other patients whose appointments are after yours.  Also, if you miss three or more appointments without notifying the office, you may be dismissed from the clinic at the provider's discretion.      For prescription refill requests, have your pharmacy contact our office and allow 72 hours for refills to be completed.    Today you received the following chemotherapy and/or immunotherapy agents: Kyprolis      To help prevent nausea and vomiting after your treatment, we encourage you to take your nausea medication as directed.  BELOW ARE SYMPTOMS THAT SHOULD BE REPORTED IMMEDIATELY: *FEVER GREATER THAN 100.4 F (38 C) OR HIGHER *CHILLS OR SWEATING *NAUSEA AND VOMITING THAT IS NOT CONTROLLED WITH YOUR NAUSEA MEDICATION *UNUSUAL SHORTNESS OF BREATH *UNUSUAL BRUISING OR BLEEDING *URINARY PROBLEMS (pain or burning when urinating, or frequent urination) *BOWEL PROBLEMS (unusual diarrhea, constipation, pain near the anus) TENDERNESS IN MOUTH AND THROAT WITH OR WITHOUT PRESENCE OF ULCERS (sore throat, sores in mouth, or a toothache) UNUSUAL RASH, SWELLING OR PAIN  UNUSUAL VAGINAL DISCHARGE OR ITCHING   Items with * indicate a potential emergency and should be followed up as soon as possible or go to the Emergency Department if any problems should occur.  Please show the CHEMOTHERAPY ALERT CARD or IMMUNOTHERAPY ALERT CARD at check-in to  the Emergency Department and triage nurse.  Should you have questions after your visit or need to cancel or reschedule your appointment, please contact [facility]  Dept: [phone]  and follow the prompts.  Office hours are 8:00 a.m. to 4:30 p.m. Monday - Friday. Please note that voicemails left after 4:00 p.m. may not be returned until the following business day.  We are closed weekends and major holidays. You have access to a nurse at all times for urgent questions. Please call the main number to the clinic Dept: [phone] and follow the prompts.   For any non-urgent questions, you may also contact your provider using MyChart. We now offer e-Visits for anyone 2 and older to request care online for non-urgent symptoms. For details visit [email or web address].   Also download the MyChart app! Go to the app store, search "MyChart", open the app, select [facility], and log in with your MyChart username and password.  Masks are optional in the cancer centers. If you would like for your care team to wear a mask while they are taking care of you, please let them know. You may have one support person who is at least 68 years old accompany you for your appointments.

## 2022-04-21 NOTE — Progress Notes (Signed)
[place] [phone]   [phone]  PROGRESS NOTE  Patient Care Team: [name], MD as PCP - General (Family Medicine) [name], MD as Consulting Physician (Physical Medicine and Rehabilitation) Donia Guiles Lavon Paganini, PA-C as Physician Assistant (Physical Medicine and Rehabilitation) Orson Slick, MD as Consulting Physician (Hematology and Oncology)  Hematological/Oncological History # IgG Lambda Multiple Myeloma, Relapsed (ISS Stage II) 1) 06/2010: initial diagnosis of Multiple Myeloma after T8 compression fracture. Treated with Velcade/Revlimid/Dexamethasone and achieved a complete remission 2) Velcade was discontinued in September 2012 and that Revlimid and Decadron were discontinued in March 2013. 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 4) Followed by Dr. Alvy Bimler, last clinic visit 10/09/2019. At that time there was concern for relapse of his multiple myeloma.  5) Patient requested transfer to different provider after misunderstanding regarding imaging studies 6) 12/17/2019: transfer care to Dr. Lorenso Courier  7) 01/09/2020: Cycle 1 Day 1 of Dara/Velcade/Dex 8) 01/21/2020: presented as urgent visit for diarrhea and dehydration. Holding chemotherapy scheduled for 01/23/2020. 9) 01/30/2020: Resume dara/velcade/dex after resolution of diarrhea.  10) 02/13/2020: restaging labs show M protein 0.8, Kappa 4.5, lamba 17.2, ratio 0.26, urine M protein 53 (7.1%). All MM labs indicate improvement.  11) 03/10/2020: Cycle 4 Day 1 of Dara/Velcade/Dex. Transition to q 3 week daratumumab.  12) 06/04/2020:  Cycle 8 Day 1 of Dara/Velcade/Dex 13) 06/25/2020:  Cycle 9 Day 1 of Dara/Velcade/Dex 14) 07/22/2020: Cycle 10 Day 1 of  Dara//Dex 15) 08/18/2020: Cycle 11 Day 1 of Dara//Dex 16) 09/23/2020: Cycle 12 Day 1 of Dara//Dex 17) 10/22/2020: Cycle 13 Day 1 of Dara/Dex   18) 11/19/2020: Cycle 14 Day 1 of Dara/Dex  19) 12/17/2020: Cycle 15 Day 1 of Dara/Dex  20) 01/17/2021: Cycle 16 Day 1 of Dara/Dex  21) 02/11/2021: Cycle 17 Day 1 of Dara/Dex 22) 03/10/2021: Cycle 18 Day 1 of Dara/Dex  23) 04/08/2021: Cycle 19 Day 1 of Dara/Dex  24) 08/03/2021: Cycle 20 Day 1 of Dara/Dex (delayed due to scheduling error) 25) 09/02/2021: Cycle 21 Day 1 of Dara/Dex 26) 09/30/2021: Cycle 22 Day 1 of Dara/Dex 27) 10/28/2021: Cycle 23 Day 1 of Dara/Dex 28) 12/02/2021: Cycle 24 Day 1 of Dara/Dex 29) 12/30/2021: Cycle 25 Day 1 of Dara/Dex 30) 01/06/2022: Cycle 1 Day 1 of Kyprolis/Dex 31) 02/03/2022: Cycle 2 Day 1 of Kyprolis/Dex 32) 02/24/2022: Cycle 3 Day 1 of Kyprolis/Dex 33) 04/07/2022: Cycle 4 Day 1 of Kyprolis/Dex  Interval History:  [name] 66 y.o. male with medical history significant for  IgG Lambda Multiple Myeloma who presents for a follow up visit. The patient's last visit was on 03/10/2022.  He presents today to for cycle 4 day 15 of Kyprolis/Dex.   On exam today [name] reports he has been well in the interim since his last visit.  He reports his energy levels are stable.  He adds that his appetite has increased with the dexamethasone.  He is trying to avoid overeating.  He denies any nausea, vomiting or abdominal pain.  He denies any bowel habit changes.  He continues to have increased urination but denies any peripheral edema.  He adds that his wife thinks that he might have obstructive sleep apnea as he snores frequently at night. He denies fevers, chills, sweats, shortness of breath, chest pain or cough.   He has no other complaints. A full 10 point ROS is listed  below.  MEDICAL HISTORY:  Past Medical History:  Diagnosis Date   Adrenal insufficiency ([place])    on chronic dexamethasone   Anemia    Cancer ([place])    Coagulopathy ([place])    on xeralto/ s/p DVT while on coumadin,  IVC in place   Diabetes mellitus without complication ([place])    type 2   Gross hematuria  7/14   post foley cath procedure   History of blood transfusion 7/14   Multiple myeloma    thoracic T8 with paraplegia s/p resection- on chemo at visit 10/13/10   Multiple myeloma    Multiple myeloma without mention of remission    Neurogenic bladder    Neurogenic bowel    Paraplegia ([place])    Partial small bowel obstruction ([place]) during dec 2011 admission    SURGICAL HISTORY: Past Surgical History:  Procedure Laterality Date   COLONOSCOPY WITH PROPOFOL N/A 04/12/2017   Procedure: COLONOSCOPY WITH PROPOFOL;  Surgeon: Danuel Felicetti Shipper, MD;  Location: [facility];  Service: Endoscopy;  Laterality: N/A;   COLONOSCOPY WITH PROPOFOL N/A 04/19/2017   Procedure: COLONOSCOPY WITH PROPOFOL;  Surgeon: Yetta Flock, MD;  Location: [facility];  Service: Gastroenterology;  Laterality: N/A;   COLOSTOMY  07/20/2011   Procedure: COLOSTOMY;  Surgeon: Judieth Keens, DO;  Location: [facility];  Service: General;;   COLOSTOMY REVISION  07/20/2011   Procedure: COLON RESECTION SIGMOID;  Surgeon: Judieth Keens, DO;  Location: [facility];  Service: General;;   CYSTOSCOPY N/A 04/04/2013   Procedure: CYSTOSCOPY WITH LITHALOPAXY;  Surgeon: Alexis Frock, MD;  Location: [facility];  Service: Urology;  Laterality: N/A;   INSERTION OF SUPRAPUBIC CATHETER N/A 04/04/2013   Procedure: INSERTION OF SUPRAPUBIC CATHETER;  Surgeon: Alexis Frock, MD;  Location: [facility];  Service: Urology;  Laterality: N/A;   LAPAROTOMY  07/20/2011   Procedure: EXPLORATORY LAPAROTOMY;  Surgeon: Judieth Keens, DO;  Location: [facility];  Service: General;  Laterality: N/A;   myeloma thoracic T8 with parpaplegia s/p thoracotomy and thoracic T7-9 cage placement on Dec 26th 2011  07/18/10    SOCIAL HISTORY: Social History   Socioeconomic History   Marital status: Married    Spouse name: Not on file   Number of children: Not on file   Years of education: Not on file   Highest education level: Not on file  Occupational History   Not  on file  Tobacco Use   Smoking status: Never   Smokeless tobacco: Never  Vaping Use   Vaping Use: Never used  Substance and Sexual Activity   Alcohol use: No   Drug use: No   Sexual activity: Never  Other Topics Concern   Not on file  Social History Narrative   Not on file   Social Determinants of Health   Financial Resource Strain: Not on file  Food Insecurity: Not on file  Transportation Needs: Not on file  Physical Activity: Not on file  Stress: Not on file  Social Connections: Not on file  Intimate Partner Violence: Not on file    FAMILY HISTORY: Family History  Problem Relation Age of Onset   Ovarian cancer Mother    Diabetes Father     ALLERGIES:  is allergic to feraheme [ferumoxytol].  MEDICATIONS:  Current Outpatient Medications  Medication Sig Dispense Refill   acyclovir (ZOVIRAX) 400 MG tablet Take 1 tablet (400 mg total) by mouth 2 (two) times daily. 60 tablet 2   atorvastatin (LIPITOR) 10 MG tablet Take 10  mg by mouth daily.     baclofen (LIORESAL) 20 MG tablet Take 20 mg by mouth 2 (two) times daily.     diazepam (DIASTAT ACUDIAL) 10 MG GEL SMARTSIG:By Mouth     dorzolamide (TRUSOPT) 2 % ophthalmic solution 1 drop 2 (two) times daily.     iron polysaccharides (NU-IRON) 150 MG capsule Take 1 capsule (150 mg total) by mouth daily. 30 capsule 0   latanoprost (XALATAN) 0.005 % ophthalmic solution Place 1 drop into both eyes at bedtime.     ondansetron (ZOFRAN) 8 MG tablet Take 1 tablet (8 mg total) by mouth every 8 (eight) hours as needed for nausea or vomiting. 30 tablet 0   rivaroxaban (XARELTO) 10 MG TABS tablet Take 1 tablet (10 mg total) by mouth daily with supper. 30 tablet 9   Skin Protectants, Misc. (EUCERIN) cream Apply 1 application topically 2 (two) times daily as needed for dry skin.      sulfamethoxazole-trimethoprim (BACTRIM DS) 800-160 MG tablet Take 1 tablet by mouth 2 (two) times daily.     zinc oxide (BALMEX) 11.3 % CREA cream Apply 1  application topically 2 (two) times daily.     No current facility-administered medications for this visit.   Facility-Administered Medications Ordered in Other Visits  Medication Dose Route Frequency Provider Last Rate Last Admin   sodium chloride flush (NS) 0.9 % injection 10 mL  10 mL Intravenous PRN Alvy Bimler, Ni, MD        REVIEW OF SYSTEMS:   Constitutional: ( - ) fevers, ( - )  chills , ( - ) night sweats Eyes: ( - ) blurriness of vision, ( - ) double vision, ( - ) watery eyes Ears, nose, mouth, throat, and face: ( - ) mucositis, ( - ) sore throat Respiratory: ( - ) cough, ( - ) dyspnea, ( - ) wheezes Cardiovascular: ( - ) palpitation, ( - ) chest discomfort, ( - ) lower extremity swelling Gastrointestinal:  ( - ) nausea, ( - ) heartburn, ( - ) change in bowel habits Skin: ( - ) abnormal skin rashes Lymphatics: ( - ) new lymphadenopathy, ( - ) easy bruising Neurological: ( - ) numbness, ( - ) tingling, ( - ) new weaknesses Behavioral/Psych: ( - ) mood change, ( - ) new changes  All other systems were reviewed with the patient and are negative.  PHYSICAL EXAMINATION: ECOG PERFORMANCE STATUS: paraplegic.   Vitals:   04/21/22 1045  BP: 125/77  Pulse: 93  Resp: 16  Temp: (!) 97.5 F (36.4 C)  SpO2: 96%      Filed Weights   04/21/22 1045  Weight: 264 lb 4.8 oz (119.9 kg)    GENERAL: well appearing middle aged [place] American male alert, no distress and comfortable SKIN: skin color, texture, turgor are normal, no rashes or significant lesions EYES: conjunctiva are pink and non-injected, sclera clear LUNGS: clear to auscultation and percussion with normal breathing effort HEART: regular rate & rhythm and no murmurs and no lower extremity edema Musculoskeletal: no cyanosis of digits and no clubbing  PSYCH: alert & oriented x 3, fluent speech NEURO: paraplegic, no use of LE bilaterally.   LABORATORY DATA:  I have reviewed the data as listed    Latest Ref Rng & Units  04/21/2022   10:32 AM 04/14/2022   11:34 AM 04/07/2022    9:25 AM  CBC  WBC 4.0 - 10.5 K/uL 7.3  6.9  7.1   Hemoglobin 13.0 - 17.0  g/dL 10.5  11.0  11.0   Hematocrit 39.0 - 52.0 % 30.2  32.9  31.6   Platelets 150 - 400 K/uL 215  198  500        Latest Ref Rng & Units 04/14/2022   11:34 AM 04/07/2022    9:25 AM 03/24/2022   11:09 AM  CMP  Glucose 70 - 99 mg/dL 100  122  100   BUN 8 - 23 mg/dL 11  9  14    Creatinine 0.61 - 1.24 mg/dL 0.71  0.70  0.76   Sodium 135 - 145 mmol/L 144  143  142   Potassium 3.5 - 5.1 mmol/L 4.0  3.7  3.9   Chloride 98 - 111 mmol/L 110  112  111   CO2 22 - 32 mmol/L 31  26  29    Calcium 8.9 - 10.3 mg/dL 8.8  9.1  9.6   Total Protein 6.5 - 8.1 g/dL 5.4  6.5  6.3   Total Bilirubin 0.3 - 1.2 mg/dL 0.7  0.6  0.8   Alkaline Phos 38 - 126 U/L 86  88  102   AST 15 - 41 U/L 14  14  12    ALT 0 - 44 U/L 21  19  23      Lab Results  Component Value Date   MPROTEIN 0.2 (H) 04/14/2022   MPROTEIN 0.3 (H) 04/07/2022   MPROTEIN 0.4 (H) 03/10/2022   Lab Results  Component Value Date   KPAFRELGTCHN 6.3 04/14/2022   KPAFRELGTCHN 7.6 04/07/2022   KPAFRELGTCHN 4.4 03/10/2022   LAMBDASER 7.3 04/14/2022   LAMBDASER 10.7 04/07/2022   LAMBDASER 12.4 03/10/2022   KAPLAMBRATIO 0.86 04/14/2022   KAPLAMBRATIO 0.71 04/07/2022   KAPLAMBRATIO 0.35 03/10/2022    RADIOGRAPHIC STUDIES: No results found.  ASSESSMENT & PLAN OLAWALE MARNEY 66 y.o. male with medical history significant for  IgG Lambda Multiple Myeloma who presents for a follow up visit.  After review of the labs, discussion with the patient, and reviewed the imaging his findings are most consistent with a relapsed multiple myeloma.The patient has had excellent success before with treatment of his myeloma with Velcade, Revlimid, and dexamethasone.  Transition to daratumumab therapy and was on a once monthly treatments but unfortunately developed progression of disease.  We have transitioned to Kyprolis/Dex.  # IgG  Lambda Multiple Myeloma, Relapsed (ISS Stage II) --findings are most consistent with relapsed multiple myeloma. Patient previously successfully treated with Velcade/Rev/Dex and Daratumumab/Velcade/Dex. On 07/02/2020 he transitioned to monthly daratumumab alone.  --due to to rise in M protein, switched to Kyprolis and Dexamethasone on 01/06/2022  Plan: --labs today were reviewed and adequate for treatment.  --Labs show white blood cell count 7.3, hemoglobin 10.5 MCV 86.3, and platelets of 215. Myeloma labs from 04/14/2022 showed M protein dropped to 0.2 with normalization of kappa lambda ratio to 0.35. --patient will proceed with Cycle 4 Day 15 Kyprolis today.  --return for weekly Kyprolis and clinic visit  in 2 weeks.   #History of DVT --He had placement of IVC filter, remains on Xarelto. --Due to poor mobility, and lack of bleeding complications, I recommend he remain on Xarelto indefinitely. --caution if Plt count were to drop <50  #Snoring/Risk for OSA: --Made referral to sleep center to test for OSA.   # Supportive Care -- provided patient with an albuterol inhaler (for use with daratumumab) --acyclovir 424m BID for VZV prophylaxis --zofran [id number] PRN for nausea/vomiting  --Zometa is being held in the  setting of his prior episode of osteonecrosis of the jaw --patient recieved pretreatment RBC phenotype  No orders of the defined types were placed in this encounter.   All questions were answered. The patient knows to call the clinic with any problems, questions or concerns.  I have spent a total of 30 minutes minutes of face-to-face and non-face-to-face time, preparing to see the patient,  performing a medically appropriate examination, counseling and educating the patient, communicating with other health care professionals, documenting clinical information in the electronic health record,  and care coordination.   Lincoln Brigham, PA-C Department of Hematology/Oncology [facility] Phone: [phone]  04/21/2022 10:50 AM   Literature Support:  Darrin Nipper, Petrucci MT, [place], [place], [place], [place], Spada S, [place], Ponticelli E, [place], Cavo M, Di Toritto TC, Shela Nevin F, Montefusco V, Palumbo A, Boccadoro M, Larocca A. Once-weekly versus twice-weekly carfilzomib in patients with newly diagnosed multiple myeloma: a pooled analysis of two phase I/II studies. Haematologica. 2019 Aug;104(8):1640-1647.  --Once-weekly 70 mg/m2 carfilzomib as induction and maintenance therapy for newly diagnosed multiple myeloma patients was as safe and effective as twice-weekly 36 mg/m2 carfilzomib and provided a more convenient schedule.

## 2022-05-05 ENCOUNTER — Inpatient Hospital Stay (HOSPITAL_BASED_OUTPATIENT_CLINIC_OR_DEPARTMENT_OTHER): Payer: Medicare (Managed Care) | Admitting: Hematology and Oncology

## 2022-05-05 ENCOUNTER — Inpatient Hospital Stay: Payer: Medicare (Managed Care)

## 2022-05-05 ENCOUNTER — Other Ambulatory Visit: Payer: Self-pay

## 2022-05-05 ENCOUNTER — Inpatient Hospital Stay: Payer: Medicare (Managed Care) | Attending: Physician Assistant

## 2022-05-05 VITALS — BP 134/73 | HR 73 | Temp 98.6°F | Resp 16

## 2022-05-05 VITALS — BP 130/83 | HR 71 | Temp 97.3°F | Resp 14 | Wt 251.0 lb

## 2022-05-05 DIAGNOSIS — C9002 Multiple myeloma in relapse: Secondary | ICD-10-CM | POA: Diagnosis not present

## 2022-05-05 DIAGNOSIS — Z95828 Presence of other vascular implants and grafts: Secondary | ICD-10-CM

## 2022-05-05 DIAGNOSIS — E119 Type 2 diabetes mellitus without complications: Secondary | ICD-10-CM | POA: Insufficient documentation

## 2022-05-05 DIAGNOSIS — Z86718 Personal history of other venous thrombosis and embolism: Secondary | ICD-10-CM | POA: Insufficient documentation

## 2022-05-05 DIAGNOSIS — Z7901 Long term (current) use of anticoagulants: Secondary | ICD-10-CM

## 2022-05-05 DIAGNOSIS — Z5111 Encounter for antineoplastic chemotherapy: Secondary | ICD-10-CM | POA: Insufficient documentation

## 2022-05-05 LAB — CMP (CANCER CENTER ONLY)
ALT: 16 U/L (ref 0–44)
AST: 14 U/L — ABNORMAL LOW (ref 15–41)
Albumin: 4.3 g/dL (ref 3.5–5.0)
Alkaline Phosphatase: 100 U/L (ref 38–126)
Anion gap: 6 (ref 5–15)
BUN: 13 mg/dL (ref 8–23)
CO2: 24 mmol/L (ref 22–32)
Calcium: 9.4 mg/dL (ref 8.9–10.3)
Chloride: 109 mmol/L (ref 98–111)
Creatinine: 1.02 mg/dL (ref 0.61–1.24)
GFR, Estimated: >60 mL/min (ref >60)
Glucose, Bld: 120 mg/dL — ABNORMAL HIGH (ref 70–99)
Potassium: 4.4 mmol/L (ref 3.5–5.1)
Sodium: 139 mmol/L (ref 135–145)
Total Bilirubin: 0.6 mg/dL (ref 0.3–1.2)
Total Protein: 7.1 g/dL (ref 6.5–8.1)

## 2022-05-05 LAB — CBC WITH DIFFERENTIAL (CANCER CENTER ONLY)
Abs Immature Granulocytes: 0.03 10*3/uL (ref 0.00–0.07)
Basophils Absolute: 0.1 10*3/uL (ref 0.0–0.1)
Basophils Relative: 1 %
Eosinophils Absolute: 0.3 10*3/uL (ref 0.0–0.5)
Eosinophils Relative: 5 %
HCT: 35.4 % — ABNORMAL LOW (ref 39.0–52.0)
Hemoglobin: 12.5 g/dL — ABNORMAL LOW (ref 13.0–17.0)
Immature Granulocytes: 1 %
Lymphocytes Relative: 17 %
Lymphs Abs: 1.1 10*3/uL (ref 0.7–4.0)
MCH: 29.9 pg (ref 26.0–34.0)
MCHC: 35.3 g/dL (ref 30.0–36.0)
MCV: 84.7 fL (ref 80.0–100.0)
Monocytes Absolute: 0.8 10*3/uL (ref 0.1–1.0)
Monocytes Relative: 12 %
Neutro Abs: 4.1 10*3/uL (ref 1.7–7.7)
Neutrophils Relative %: 64 %
Platelet Count: 453 10*3/uL — ABNORMAL HIGH (ref 150–400)
RBC: 4.18 MIL/uL — ABNORMAL LOW (ref 4.22–5.81)
RDW: 15.5 % (ref 11.5–15.5)
WBC Count: 6.4 10*3/uL (ref 4.0–10.5)
nRBC: 0 % (ref 0.0–0.2)

## 2022-05-05 MED ORDER — SODIUM CHLORIDE 0.9 % IV SOLN: Freq: Once | INTRAVENOUS | Status: AC

## 2022-05-05 MED ORDER — DEXTROSE 5 % IV SOLN
70.0000 mg/m2 | Freq: Once | INTRAVENOUS | Status: AC
  Administered 2022-05-05: 150 mg via INTRAVENOUS
  Filled 2022-05-05: qty 60

## 2022-05-05 MED ORDER — SODIUM CHLORIDE 0.9 % IV SOLN
40.0000 mg | Freq: Once | INTRAVENOUS | Status: AC
  Administered 2022-05-05: 40 mg via INTRAVENOUS
  Filled 2022-05-05: qty 4

## 2022-05-05 NOTE — Patient Instructions (Signed)
[facility]  Discharge Instructions: Thank you for choosing [facility] to provide your oncology and hematology care.   If you have a lab appointment with the [facility], please go directly to the [facility] and check in at the registration area.   Wear comfortable clothing and clothing appropriate for easy access to any Portacath or PICC line.   We strive to give you quality time with your provider. You may need to reschedule your appointment if you arrive late (15 or more minutes).  Arriving late affects you and other patients whose appointments are after yours.  Also, if you miss three or more appointments without notifying the office, you may be dismissed from the clinic at the provider's discretion.      For prescription refill requests, have your pharmacy contact our office and allow 72 hours for refills to be completed.    Today you received the following chemotherapy and/or immunotherapy agent: Carfilzomib (Kyprolis)   To help prevent nausea and vomiting after your treatment, we encourage you to take your nausea medication as directed.  BELOW ARE SYMPTOMS THAT SHOULD BE REPORTED IMMEDIATELY: *FEVER GREATER THAN 100.4 F (38 C) OR HIGHER *CHILLS OR SWEATING *NAUSEA AND VOMITING THAT IS NOT CONTROLLED WITH YOUR NAUSEA MEDICATION *UNUSUAL SHORTNESS OF BREATH *UNUSUAL BRUISING OR BLEEDING *URINARY PROBLEMS (pain or burning when urinating, or frequent urination) *BOWEL PROBLEMS (unusual diarrhea, constipation, pain near the anus) TENDERNESS IN MOUTH AND THROAT WITH OR WITHOUT PRESENCE OF ULCERS (sore throat, sores in mouth, or a toothache) UNUSUAL RASH, SWELLING OR PAIN  UNUSUAL VAGINAL DISCHARGE OR ITCHING   Items with * indicate a potential emergency and should be followed up as soon as possible or go to the Emergency Department if any problems should occur.  Please show the CHEMOTHERAPY ALERT CARD or IMMUNOTHERAPY ALERT CARD at  check-in to the Emergency Department and triage nurse.  Should you have questions after your visit or need to cancel or reschedule your appointment, please contact [facility]  Dept: [phone]  and follow the prompts.  Office hours are 8:00 a.m. to 4:30 p.m. Monday - Friday. Please note that voicemails left after 4:00 p.m. may not be returned until the following business day.  We are closed weekends and major holidays. You have access to a nurse at all times for urgent questions. Please call the main number to the clinic Dept: [phone] and follow the prompts.   For any non-urgent questions, you may also contact your provider using MyChart. We now offer e-Visits for anyone 10 and older to request care online for non-urgent symptoms. For details visit [email or web address].   Also download the MyChart app! Go to the app store, search "MyChart", open the app, select [facility], and log in with your MyChart username and password.  Masks are optional in the cancer centers. If you would like for your care team to wear a mask while they are taking care of you, please let them know. You may have one support person who is at least 68 years old accompany you for your appointments. Carfilzomib Injection What is this medication? CARFILZOMIB (kar FILZ oh mib) treats multiple myeloma, a type of bone marrow cancer. It works by blocking a protein that causes cancer cells to grow and multiply. This helps to slow or stop the spread of cancer cells. This medicine may be used for other purposes; ask your health care provider or pharmacist if you have questions.  COMMON BRAND NAME(S): KYPROLIS What should I tell my care team before I take this medication? They need to know if you have any of these conditions: Heart disease History of blood clots Irregular heartbeat Kidney disease Liver disease Lung or breathing disease An unusual or allergic reaction to carfilzomib, or other  medications, foods, dyes, or preservatives If you or your partner are pregnant or trying to get pregnant Breastfeeding How should I use this medication? This medication is injected into a vein. It is given by your care team in a hospital or clinic setting. Talk to your care team about the use of this medication in children. Special care may be needed. Overdosage: If you think you have taken too much of this medicine contact a poison control center or emergency room at once. NOTE: This medicine is only for you. Do not share this medicine with others. What if I miss a dose? Keep appointments for follow-up doses. It is important not to miss your dose. Call your care team if you are unable to keep an appointment. What may interact with this medication? Interactions are not expected. This list may not describe all possible interactions. Give your health care provider a list of all the medicines, herbs, non-prescription drugs, or dietary supplements you use. Also tell them if you smoke, drink alcohol, or use illegal drugs. Some items may interact with your medicine. What should I watch for while using this medication? Your condition will be monitored carefully while you are receiving this medication. You may need blood work while taking this medication. Check with your care team if you have severe diarrhea, nausea, and vomiting, or if you sweat a lot. The loss of too much body fluid may make it dangerous for you to take this medication. This medication may affect your coordination, reaction time, or judgment. Do not drive or operate machinery until you know how this medication affects you. Sit up or stand slowly to reduce the risk of dizzy or fainting spells. Drinking alcohol with this medication can increase the risk of these side effects. Talk to your care team if you may be pregnant. Serious birth defects can occur if you take this medication during pregnancy and for 6 months after the last dose. You  will need a negative pregnancy test before starting this medication. Contraception is recommended while taking this medication and for 6 months after the last dose. Your care team can help you find an option that works for you. If your partner can get pregnant, use a condom during sex while taking this medication and for 3 months after the last dose. Do not breastfeed while taking this medication and for 2 weeks after the last dose. This medication may cause infertility. Talk to your care team if you are concerned about your fertility. What side effects may I notice from receiving this medication? Side effects that you should report to your care team as soon as possible: Allergic reactions--skin rash, itching, hives, swelling of the face, lips, tongue, or throat Bleeding--bloody or black, tar-like stools, vomiting blood or brown material that looks like coffee grounds, red or dark brown urine, small red or purple spots on skin, unusual bruising or bleeding Blood clot--pain, swelling, or warmth in the leg, shortness of breath, chest pain Dizziness, loss of balance or coordination, confusion or trouble speaking Heart attack--pain or tightness in the chest, shoulders, arms, or jaw, nausea, shortness of breath, cold or clammy skin, feeling faint or lightheaded Heart failure--shortness of breath, swelling of  the ankles, feet, or hands, sudden weight gain, unusual weakness or fatigue Heart rhythm changes--fast or irregular heartbeat, dizziness, feeling faint or lightheaded, chest pain, trouble breathing Increase in blood pressure Infection--fever, chills, cough, sore throat, wounds that don't heal, pain or trouble when passing urine, general feeling of discomfort or being unwell Infusion reactions--chest pain, shortness of breath or trouble breathing, feeling faint or lightheaded Kidney injury--decrease in the amount of urine, swelling of the ankles, hands, or feet Liver injury--right upper belly pain,  loss of appetite, nausea, light-colored stool, dark yellow or brown urine, yellowing skin or eyes, unusual weakness or fatigue Lung injury--shortness of breath or trouble breathing, cough, spitting up blood, chest pain, fever Pulmonary hypertension--shortness of breath, chest pain, fast or irregular heartbeat, feeling faint or lightheaded, fatigue, swelling of the ankles or feet Stomach pain, bloody diarrhea, pale skin, unusual weakness or fatigue, decrease in the amount of urine, which may be signs of hemolytic uremic syndrome Sudden and severe headache, confusion, change in vision, seizures, which may be signs of posterior reversible encephalopathy syndrome (PRES) TTP--purple spots on the skin or inside the mouth, pale skin, yellowing skin or eyes, unusual weakness or fatigue, fever, fast or irregular heartbeat, confusion, change in vision, trouble speaking, trouble walking Tumor lysis syndrome (TLS)--nausea, vomiting, diarrhea, decrease in the amount of urine, dark urine, unusual weakness or fatigue, confusion, muscle pain or cramps, fast or irregular heartbeat, joint pain Side effects that usually do not require medical attention (report to your care team if they continue or are bothersome): Diarrhea Fatigue Nausea Trouble sleeping This list may not describe all possible side effects. Call your doctor for medical advice about side effects. You may report side effects to FDA at 1-800-FDA-1088. Where should I keep my medication? This medication is given in a hospital or clinic. It will not be stored at home. NOTE: This sheet is a summary. It may not cover all possible information. If you have questions about this medicine, talk to your doctor, pharmacist, or health care provider.  2023 Elsevier/Gold Standard (2021-12-07 00:00:00)  

## 2022-05-05 NOTE — Progress Notes (Signed)
[place] [phone]) [phone]   [phone]) [phone]  PROGRESS NOTE  Patient Care Team: [name], MD as PCP - General (Family Medicine) [name], MD as Consulting Physician (Physical Medicine and Rehabilitation) Donia Guiles Lavon Paganini, PA-C as Physician Assistant (Physical Medicine and Rehabilitation) Orson Slick, MD as Consulting Physician (Hematology and Oncology)  Hematological/Oncological History # IgG Lambda Multiple Myeloma, Relapsed (ISS Stage II) 1) 06/2010: initial diagnosis of Multiple Myeloma after T8 compression fracture. Treated with Velcade/Revlimid/Dexamethasone and achieved a complete remission 2) Velcade was discontinued in September 2012 and that Revlimid and Decadron were discontinued in March 2013. 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 4) Followed by Dr. Alvy Bimler, last clinic visit 10/09/2019. At that time there was concern for relapse of his multiple myeloma.  5) Patient requested transfer to different provider after misunderstanding regarding imaging studies 6) 12/17/2019: transfer care to Dr. Lorenso Courier  7) 01/09/2020: Cycle 1 Day 1 of Dara/Velcade/Dex 8) 01/21/2020: presented as urgent visit for diarrhea and dehydration. Holding chemotherapy scheduled for 01/23/2020. 9) 01/30/2020: Resume dara/velcade/dex after resolution of diarrhea.  10) 02/13/2020: restaging labs show M protein 0.8, Kappa 4.5, lamba 17.2, ratio 0.26, urine M protein 53 (7.1%). All MM labs indicate improvement.  11) 03/10/2020: Cycle 4 Day 1 of Dara/Velcade/Dex. Transition to q 3 week daratumumab.  12) 06/04/2020:  Cycle 8 Day 1 of Dara/Velcade/Dex 13) 06/25/2020:  Cycle 9 Day 1 of Dara/Velcade/Dex 14) 07/22/2020: Cycle 10 Day 1 of  Dara//Dex 15) 08/18/2020: Cycle 11 Day 1 of Dara//Dex 16) 09/23/2020: Cycle 12 Day 1 of Dara//Dex 17) 10/22/2020: Cycle 13 Day 1 of Dara/Dex   18) 11/19/2020: Cycle 14 Day 1 of Dara/Dex  19) 12/17/2020: Cycle 15 Day 1 of Dara/Dex  20) 01/17/2021: Cycle 16 Day 1 of Dara/Dex  21) 02/11/2021: Cycle 17 Day 1 of Dara/Dex 22) 03/10/2021: Cycle 18 Day 1 of Dara/Dex  23) 04/08/2021: Cycle 19 Day 1 of Dara/Dex  24) 08/03/2021: Cycle 20 Day 1 of Dara/Dex (delayed due to scheduling error) 25) 09/02/2021: Cycle 21 Day 1 of Dara/Dex 26) 09/30/2021: Cycle 22 Day 1 of Dara/Dex 27) 10/28/2021: Cycle 23 Day 1 of Dara/Dex 28) 12/02/2021: Cycle 24 Day 1 of Dara/Dex 29) 12/30/2021: Cycle 25 Day 1 of Dara/Dex 30) 01/06/2022: Cycle 1 Day 1 of Kyprolis/Dex 31) 02/03/2022: Cycle 2 Day 1 of Kyprolis/Dex 32) 02/24/2022: Cycle 3 Day 1 of Kyprolis/Dex 33) 04/07/2022: Cycle 4 Day 1 of Kyprolis/Dex 34) 05/05/2022: Cycle 5 Day 1 of Kyprolis/Dex  Interval History:  [name] 66 y.o. male with medical history significant for  IgG Lambda Multiple Myeloma who presents for a follow up visit. The patient's last visit was on 03/10/2022.  He presents today to for cycle 5 day 1 of Kyprolis/Dex.   On exam today [name] reports he has been gaining weight with the frequent steroids in his regimen.  He reports that he is having to wear larger bag your close.  He notes that he is tolerating the Kyprolis quite well and is not having any major difficulties with treatment.  He is not having any numbness or tingling of his fingers and toes and overall his energy is quite good.  His appetite is strong and he is not having any shortness of breath or fatigue.  He denies fevers, chills, sweats, shortness of breath, chest pain or cough.   He has no other complaints. A  full 10 point ROS is listed below.  Overall he is willing and able to proceed with treatment at this time.  MEDICAL HISTORY:  Past Medical History:  Diagnosis Date   Adrenal insufficiency ([place])    on chronic dexamethasone   Anemia    Cancer ([place])    Coagulopathy ([place])    on xeralto/ s/p DVT while on coumadin,  IVC in  place   Diabetes mellitus without complication ([place])    type 2   Gross hematuria 7/14   post foley cath procedure   History of blood transfusion 7/14   Multiple myeloma    thoracic T8 with paraplegia s/p resection- on chemo at visit 10/13/10   Multiple myeloma    Multiple myeloma without mention of remission    Neurogenic bladder    Neurogenic bowel    Paraplegia ([place])    Partial small bowel obstruction ([place]) during dec 2011 admission    SURGICAL HISTORY: Past Surgical History:  Procedure Laterality Date   COLONOSCOPY WITH PROPOFOL N/A 04/12/2017   Procedure: COLONOSCOPY WITH PROPOFOL;  Surgeon: Irene Shipper, MD;  Location: [facility];  Service: Endoscopy;  Laterality: N/A;   COLONOSCOPY WITH PROPOFOL N/A 04/19/2017   Procedure: COLONOSCOPY WITH PROPOFOL;  Surgeon: Yetta Flock, MD;  Location: [facility];  Service: Gastroenterology;  Laterality: N/A;   COLOSTOMY  07/20/2011   Procedure: COLOSTOMY;  Surgeon: Judieth Keens, DO;  Location: [facility];  Service: General;;   COLOSTOMY REVISION  07/20/2011   Procedure: COLON RESECTION SIGMOID;  Surgeon: Judieth Keens, DO;  Location: [facility];  Service: General;;   CYSTOSCOPY N/A 04/04/2013   Procedure: CYSTOSCOPY WITH LITHALOPAXY;  Surgeon: Alexis Frock, MD;  Location: [facility];  Service: Urology;  Laterality: N/A;   INSERTION OF SUPRAPUBIC CATHETER N/A 04/04/2013   Procedure: INSERTION OF SUPRAPUBIC CATHETER;  Surgeon: Alexis Frock, MD;  Location: [facility];  Service: Urology;  Laterality: N/A;   LAPAROTOMY  07/20/2011   Procedure: EXPLORATORY LAPAROTOMY;  Surgeon: Judieth Keens, DO;  Location: [facility];  Service: General;  Laterality: N/A;   myeloma thoracic T8 with parpaplegia s/p thoracotomy and thoracic T7-9 cage placement on Dec 26th 2011  07/18/10    SOCIAL HISTORY: Social History   Socioeconomic History   Marital status: Married    Spouse name: Not on file   Number of children: Not on file   Years of education:  Not on file   Highest education level: Not on file  Occupational History   Not on file  Tobacco Use   Smoking status: Never   Smokeless tobacco: Never  Vaping Use   Vaping Use: Never used  Substance and Sexual Activity   Alcohol use: No   Drug use: No   Sexual activity: Never  Other Topics Concern   Not on file  Social History Narrative   Not on file   Social Determinants of Health   Financial Resource Strain: Not on file  Food Insecurity: Not on file  Transportation Needs: Not on file  Physical Activity: Not on file  Stress: Not on file  Social Connections: Not on file  Intimate Partner Violence: Not on file    FAMILY HISTORY: Family History  Problem Relation Age of Onset   Ovarian cancer Mother    Diabetes Father     ALLERGIES:  is allergic to feraheme [ferumoxytol].  MEDICATIONS:  Current Outpatient Medications  Medication Sig Dispense Refill   acyclovir (ZOVIRAX) 400 MG tablet Take 1 tablet (400  mg total) by mouth 2 (two) times daily. 60 tablet 2   atorvastatin (LIPITOR) 10 MG tablet Take 10 mg by mouth daily.     baclofen (LIORESAL) 20 MG tablet Take 20 mg by mouth 2 (two) times daily.     diazepam (DIASTAT ACUDIAL) 10 MG GEL SMARTSIG:By Mouth     dorzolamide (TRUSOPT) 2 % ophthalmic solution 1 drop 2 (two) times daily.     iron polysaccharides (NU-IRON) 150 MG capsule Take 1 capsule (150 mg total) by mouth daily. 30 capsule 0   latanoprost (XALATAN) 0.005 % ophthalmic solution Place 1 drop into both eyes at bedtime.     ondansetron (ZOFRAN) 8 MG tablet Take 1 tablet (8 mg total) by mouth every 8 (eight) hours as needed for nausea or vomiting. 30 tablet 0   rivaroxaban (XARELTO) 10 MG TABS tablet Take 1 tablet (10 mg total) by mouth daily with supper. 30 tablet 9   Skin Protectants, Misc. (EUCERIN) cream Apply 1 application topically 2 (two) times daily as needed for dry skin.      sulfamethoxazole-trimethoprim (BACTRIM DS) 800-160 MG tablet Take 1 tablet by  mouth 2 (two) times daily.     zinc oxide (BALMEX) 11.3 % CREA cream Apply 1 application topically 2 (two) times daily.     No current facility-administered medications for this visit.   Facility-Administered Medications Ordered in Other Visits  Medication Dose Route Frequency Provider Last Rate Last Admin   sodium chloride flush (NS) 0.9 % injection 10 mL  10 mL Intravenous PRN Alvy Bimler, Ni, MD        REVIEW OF SYSTEMS:   Constitutional: ( - ) fevers, ( - )  chills , ( - ) night sweats Eyes: ( - ) blurriness of vision, ( - ) double vision, ( - ) watery eyes Ears, nose, mouth, throat, and face: ( - ) mucositis, ( - ) sore throat Respiratory: ( - ) cough, ( - ) dyspnea, ( - ) wheezes Cardiovascular: ( - ) palpitation, ( - ) chest discomfort, ( - ) lower extremity swelling Gastrointestinal:  ( - ) nausea, ( - ) heartburn, ( - ) change in bowel habits Skin: ( - ) abnormal skin rashes Lymphatics: ( - ) new lymphadenopathy, ( - ) easy bruising Neurological: ( - ) numbness, ( - ) tingling, ( - ) new weaknesses Behavioral/Psych: ( - ) mood change, ( - ) new changes  All other systems were reviewed with the patient and are negative.  PHYSICAL EXAMINATION: ECOG PERFORMANCE STATUS: paraplegic.   Vitals:   05/05/22 1221  BP: 130/83  Pulse: 71  Resp: 14  Temp: (!) 97.3 F (36.3 C)  SpO2: 100%       Filed Weights   05/05/22 1221  Weight: 251 lb (113.9 kg)     GENERAL: well appearing middle aged [place] American male alert, no distress and comfortable SKIN: skin color, texture, turgor are normal, no rashes or significant lesions EYES: conjunctiva are pink and non-injected, sclera clear LUNGS: clear to auscultation and percussion with normal breathing effort HEART: regular rate & rhythm and no murmurs and no lower extremity edema Musculoskeletal: no cyanosis of digits and no clubbing  PSYCH: alert & oriented x 3, fluent speech NEURO: paraplegic, no use of LE bilaterally.    LABORATORY DATA:  I have reviewed the data as listed    Latest Ref Rng & Units 05/12/2022    9:28 AM 05/05/2022   11:26 AM 04/21/2022   10:32  AM  CBC  WBC 4.0 - 10.5 K/uL 8.5  6.4  7.3   Hemoglobin 13.0 - 17.0 g/dL 11.5  12.5  10.5   Hematocrit 39.0 - 52.0 % 33.3  35.4  30.2   Platelets 150 - 400 K/uL 191  453  215        Latest Ref Rng & Units 05/12/2022    9:28 AM 05/05/2022   11:26 AM 04/21/2022   10:32 AM  CMP  Glucose 70 - 99 mg/dL 107  120  153   BUN 8 - 23 mg/dL 13  13  11    Creatinine 0.61 - 1.24 mg/dL 0.74  1.02  0.72   Sodium 135 - 145 mmol/L 142  139  143   Potassium 3.5 - 5.1 mmol/L 4.0  4.4  3.8   Chloride 98 - 111 mmol/L 109  109  113   CO2 22 - 32 mmol/L 29  24  27    Calcium 8.9 - 10.3 mg/dL 9.5  9.4  8.9   Total Protein 6.5 - 8.1 g/dL 6.1  7.1  6.0   Total Bilirubin 0.3 - 1.2 mg/dL 0.7  0.6  0.5   Alkaline Phos 38 - 126 U/L 87  100  96   AST 15 - 41 U/L 12  14  11    ALT 0 - 44 U/L 20  16  29      Lab Results  Component Value Date   MPROTEIN 0.3 (H) 05/05/2022   MPROTEIN 0.2 (H) 04/14/2022   MPROTEIN 0.3 (H) 04/07/2022   Lab Results  Component Value Date   KPAFRELGTCHN 5.1 05/12/2022   KPAFRELGTCHN 10.3 05/05/2022   KPAFRELGTCHN 6.3 04/14/2022   LAMBDASER 4.1 (L) 05/12/2022   LAMBDASER 7.0 05/05/2022   LAMBDASER 7.3 04/14/2022   KAPLAMBRATIO 1.24 05/12/2022   KAPLAMBRATIO 1.47 05/05/2022   KAPLAMBRATIO 0.86 04/14/2022    RADIOGRAPHIC STUDIES: No results found.  ASSESSMENT & PLAN [name] 66 y.o. male with medical history significant for  IgG Lambda Multiple Myeloma who presents for a follow up visit.  After review of the labs, discussion with the patient, and reviewed the imaging his findings are most consistent with a relapsed multiple myeloma.The patient has had excellent success before with treatment of his myeloma with Velcade, Revlimid, and dexamethasone.  Transition to daratumumab therapy and was on a once monthly treatments but  unfortunately developed progression of disease.  We have transitioned to Kyprolis/Dex.  # IgG Lambda Multiple Myeloma, Relapsed (ISS Stage II) --findings are most consistent with relapsed multiple myeloma. Patient previously successfully treated with Velcade/Rev/Dex and Daratumumab/Velcade/Dex. On 07/02/2020 he transitioned to monthly daratumumab alone.  --due to to rise in M protein, switched to Kyprolis and Dexamethasone on 01/06/2022 Plan: --labs today were reviewed and adequate for treatment.  --Labs show white blood cell count 6.4, hemoglobin 12.5 MCV 85.4 and platelets of 191. Myeloma labs from 05/05/2022 showed M protein dropped to 0.3 with normalization of kappa lambda ratio to 1.47 --patient will proceed with Cycle 5 Day 1 Kyprolis today.  --return for weekly Kyprolis and clinic visit  in 2 weeks.   #History of DVT --He had placement of IVC filter, remains on Xarelto. --Due to poor mobility, and lack of bleeding complications, I recommend he remain on Xarelto indefinitely. --caution if Plt count were to drop <50  #Snoring/Risk for OSA: --Made referral to sleep center to test for OSA.   # Supportive Care -- provided patient with an albuterol inhaler (for use with  daratumumab) --acyclovir 472m BID for VZV prophylaxis --zofran [id number] PRN for nausea/vomiting  --Zometa is being held in the setting of his prior episode of osteonecrosis of the jaw  No orders of the defined types were placed in this encounter.   All questions were answered. The patient knows to call the clinic with any problems, questions or concerns.  I have spent a total of 30 minutes minutes of face-to-face and non-face-to-face time, preparing to see the patient,  performing a medically appropriate examination, counseling and educating the patient, communicating with other health care professionals, documenting clinical information in the electronic health record,  and care coordination.   JoOrson Slick MD Department of Hematology/Oncology [facility] In[facility] Point [facility] Centerhone: [phone]/24/2023 2:26 PM   Literature Support:  BrDarrin NipperPetrucci MT, GaFullertonBaVirginiaMuC[place] St. ClaireOfDoverSpada S, BeWindfall CityPonticelli E, GaRidgeCavo M, Di Toritto TC, DiShela Nevin, Montefusco V, Palumbo A, Boccadoro M, Larocca A. Once-weekly versus twice-weekly carfilzomib in patients with newly diagnosed multiple myeloma: a pooled analysis of two phase I/II studies. Haematologica. 2019 Aug;104(8):1640-1647.  --Once-weekly 70 mg/m2 carfilzomib as induction and maintenance therapy for newly diagnosed multiple myeloma patients was as safe and effective as twice-weekly 36 mg/m2 carfilzomib and provided a more convenient schedule.

## 2022-05-08 LAB — KAPPA/LAMBDA LIGHT CHAINS
Kappa free light chain: 10.3 mg/L (ref 3.3–19.4)
Kappa, lambda light chain ratio: 1.47 (ref 0.26–1.65)
Lambda free light chains: 7 mg/L (ref 5.7–26.3)

## 2022-05-10 LAB — MULTIPLE MYELOMA PANEL, SERUM
Albumin SerPl Elph-Mcnc: 3.7 g/dL (ref 2.9–4.4)
Albumin/Glob SerPl: 1.4 (ref 0.7–1.7)
Alpha 1: 0.3 g/dL (ref 0.0–0.4)
Alpha2 Glob SerPl Elph-Mcnc: 0.8 g/dL (ref 0.4–1.0)
B-Globulin SerPl Elph-Mcnc: 1 g/dL (ref 0.7–1.3)
Gamma Glob SerPl Elph-Mcnc: 0.7 g/dL (ref 0.4–1.8)
Globulin, Total: 2.8 g/dL (ref 2.2–3.9)
IgA: 28 mg/dL — ABNORMAL LOW (ref 61–437)
IgG (Immunoglobin G), Serum: 741 mg/dL (ref 603–1613)
IgM (Immunoglobulin M), Srm: 23 mg/dL (ref 20–172)
M Protein SerPl Elph-Mcnc: 0.3 g/dL — ABNORMAL HIGH
Total Protein ELP: 6.5 g/dL (ref 6.0–8.5)

## 2022-05-12 ENCOUNTER — Ambulatory Visit: Payer: Medicare (Managed Care)

## 2022-05-12 ENCOUNTER — Inpatient Hospital Stay: Payer: Medicare (Managed Care)

## 2022-05-12 ENCOUNTER — Other Ambulatory Visit: Payer: Self-pay

## 2022-05-12 ENCOUNTER — Other Ambulatory Visit: Payer: Medicare (Managed Care)

## 2022-05-12 VITALS — BP 128/82 | HR 87 | Temp 98.2°F | Resp 18

## 2022-05-12 DIAGNOSIS — C9002 Multiple myeloma in relapse: Secondary | ICD-10-CM

## 2022-05-12 DIAGNOSIS — Z5111 Encounter for antineoplastic chemotherapy: Secondary | ICD-10-CM | POA: Diagnosis not present

## 2022-05-12 LAB — CMP (CANCER CENTER ONLY)
ALT: 20 U/L (ref 0–44)
AST: 12 U/L — ABNORMAL LOW (ref 15–41)
Albumin: 3.8 g/dL (ref 3.5–5.0)
Alkaline Phosphatase: 87 U/L (ref 38–126)
Anion gap: 4 — ABNORMAL LOW (ref 5–15)
BUN: 13 mg/dL (ref 8–23)
CO2: 29 mmol/L (ref 22–32)
Calcium: 9.5 mg/dL (ref 8.9–10.3)
Chloride: 109 mmol/L (ref 98–111)
Creatinine: 0.74 mg/dL (ref 0.61–1.24)
GFR, Estimated: >60 mL/min (ref >60)
Glucose, Bld: 107 mg/dL — ABNORMAL HIGH (ref 70–99)
Potassium: 4 mmol/L (ref 3.5–5.1)
Sodium: 142 mmol/L (ref 135–145)
Total Bilirubin: 0.7 mg/dL (ref 0.3–1.2)
Total Protein: 6.1 g/dL — ABNORMAL LOW (ref 6.5–8.1)

## 2022-05-12 LAB — CBC WITH DIFFERENTIAL (CANCER CENTER ONLY)
Abs Immature Granulocytes: 0.04 10*3/uL (ref 0.00–0.07)
Basophils Absolute: 0 10*3/uL (ref 0.0–0.1)
Basophils Relative: 0 %
Eosinophils Absolute: 0.4 10*3/uL (ref 0.0–0.5)
Eosinophils Relative: 4 %
HCT: 33.3 % — ABNORMAL LOW (ref 39.0–52.0)
Hemoglobin: 11.5 g/dL — ABNORMAL LOW (ref 13.0–17.0)
Immature Granulocytes: 1 %
Lymphocytes Relative: 11 %
Lymphs Abs: 1 10*3/uL (ref 0.7–4.0)
MCH: 29.5 pg (ref 26.0–34.0)
MCHC: 34.5 g/dL (ref 30.0–36.0)
MCV: 85.4 fL (ref 80.0–100.0)
Monocytes Absolute: 0.7 10*3/uL (ref 0.1–1.0)
Monocytes Relative: 8 %
Neutro Abs: 6.4 10*3/uL (ref 1.7–7.7)
Neutrophils Relative %: 76 %
Platelet Count: 191 10*3/uL (ref 150–400)
RBC: 3.9 MIL/uL — ABNORMAL LOW (ref 4.22–5.81)
RDW: 15.6 % — ABNORMAL HIGH (ref 11.5–15.5)
WBC Count: 8.5 10*3/uL (ref 4.0–10.5)
nRBC: 0.2 % (ref 0.0–0.2)

## 2022-05-12 LAB — URIC ACID: Uric Acid, Serum: 6.1 mg/dL (ref 3.7–8.6)

## 2022-05-12 LAB — LACTATE DEHYDROGENASE: LDH: 126 U/L (ref 98–192)

## 2022-05-12 MED ORDER — SODIUM CHLORIDE 0.9 % IV SOLN: Freq: Once | INTRAVENOUS | Status: AC

## 2022-05-12 MED ORDER — SODIUM CHLORIDE 0.9 % IV SOLN
40.0000 mg | Freq: Once | INTRAVENOUS | Status: AC
  Administered 2022-05-12: 40 mg via INTRAVENOUS
  Filled 2022-05-12: qty 4

## 2022-05-12 MED ORDER — DEXTROSE 5 % IV SOLN
70.0000 mg/m2 | Freq: Once | INTRAVENOUS | Status: AC
  Administered 2022-05-12: 150 mg via INTRAVENOUS
  Filled 2022-05-12: qty 60

## 2022-05-12 NOTE — Patient Instructions (Signed)
[facility]  Discharge Instructions: Thank you for choosing [facility] to provide your oncology and hematology care.   If you have a lab appointment with the Cancer Center, please go directly to the Cancer Center and check in at the registration area.   Wear comfortable clothing and clothing appropriate for easy access to any Portacath or PICC line.   We strive to give you quality time with your provider. You may need to reschedule your appointment if you arrive late (15 or more minutes).  Arriving late affects you and other patients whose appointments are after yours.  Also, if you miss three or more appointments without notifying the office, you may be dismissed from the clinic at the provider's discretion.      For prescription refill requests, have your pharmacy contact our office and allow 72 hours for refills to be completed.    Today you received the following chemotherapy and/or immunotherapy agent: Carfilzomib (Kyprolis)   To help prevent nausea and vomiting after your treatment, we encourage you to take your nausea medication as directed.  BELOW ARE SYMPTOMS THAT SHOULD BE REPORTED IMMEDIATELY: *FEVER GREATER THAN 100.4 F (38 C) OR HIGHER *CHILLS OR SWEATING *NAUSEA AND VOMITING THAT IS NOT CONTROLLED WITH YOUR NAUSEA MEDICATION *UNUSUAL SHORTNESS OF BREATH *UNUSUAL BRUISING OR BLEEDING *URINARY PROBLEMS (pain or burning when urinating, or frequent urination) *BOWEL PROBLEMS (unusual diarrhea, constipation, pain near the anus) TENDERNESS IN MOUTH AND THROAT WITH OR WITHOUT PRESENCE OF ULCERS (sore throat, sores in mouth, or a toothache) UNUSUAL RASH, SWELLING OR PAIN  UNUSUAL VAGINAL DISCHARGE OR ITCHING   Items with * indicate a potential emergency and should be followed up as soon as possible or go to the Emergency Department if any problems should occur.  Please show the CHEMOTHERAPY ALERT CARD or IMMUNOTHERAPY ALERT CARD at  check-in to the Emergency Department and triage nurse.  Should you have questions after your visit or need to cancel or reschedule your appointment, please contact [facility]  Dept: [phone]  and follow the prompts.  Office hours are 8:00 a.m. to 4:30 p.m. Monday - Friday. Please note that voicemails left after 4:00 p.m. may not be returned until the following business day.  We are closed weekends and major holidays. You have access to a nurse at all times for urgent questions. Please call the main number to the clinic Dept: [phone] and follow the prompts.   For any non-urgent questions, you may also contact your provider using MyChart. We now offer e-Visits for anyone 18 and older to request care online for non-urgent symptoms. For details visit [email or web address].   Also download the MyChart app! Go to the app store, search "MyChart", open the app, select , and log in with your MyChart username and password.  Masks are optional in the cancer centers. If you would like for your care team to wear a mask while they are taking care of you, please let them know. You may have one support person who is at least 68 years old accompany you for your appointments. Carfilzomib Injection What is this medication? CARFILZOMIB (kar FILZ oh mib) treats multiple myeloma, a type of bone marrow cancer. It works by blocking a protein that causes cancer cells to grow and multiply. This helps to slow or stop the spread of cancer cells. This medicine may be used for other purposes; ask your health care provider or pharmacist if you have questions.   COMMON BRAND NAME(S): KYPROLIS What should I tell my care team before I take this medication? They need to know if you have any of these conditions: Heart disease History of blood clots Irregular heartbeat Kidney disease Liver disease Lung or breathing disease An unusual or allergic reaction to carfilzomib, or other  medications, foods, dyes, or preservatives If you or your partner are pregnant or trying to get pregnant Breastfeeding How should I use this medication? This medication is injected into a vein. It is given by your care team in a hospital or clinic setting. Talk to your care team about the use of this medication in children. Special care may be needed. Overdosage: If you think you have taken too much of this medicine contact a poison control center or emergency room at once. NOTE: This medicine is only for you. Do not share this medicine with others. What if I miss a dose? Keep appointments for follow-up doses. It is important not to miss your dose. Call your care team if you are unable to keep an appointment. What may interact with this medication? Interactions are not expected. This list may not describe all possible interactions. Give your health care provider a list of all the medicines, herbs, non-prescription drugs, or dietary supplements you use. Also tell them if you smoke, drink alcohol, or use illegal drugs. Some items may interact with your medicine. What should I watch for while using this medication? Your condition will be monitored carefully while you are receiving this medication. You may need blood work while taking this medication. Check with your care team if you have severe diarrhea, nausea, and vomiting, or if you sweat a lot. The loss of too much body fluid may make it dangerous for you to take this medication. This medication may affect your coordination, reaction time, or judgment. Do not drive or operate machinery until you know how this medication affects you. Sit up or stand slowly to reduce the risk of dizzy or fainting spells. Drinking alcohol with this medication can increase the risk of these side effects. Talk to your care team if you may be pregnant. Serious birth defects can occur if you take this medication during pregnancy and for 6 months after the last dose. You  will need a negative pregnancy test before starting this medication. Contraception is recommended while taking this medication and for 6 months after the last dose. Your care team can help you find an option that works for you. If your partner can get pregnant, use a condom during sex while taking this medication and for 3 months after the last dose. Do not breastfeed while taking this medication and for 2 weeks after the last dose. This medication may cause infertility. Talk to your care team if you are concerned about your fertility. What side effects may I notice from receiving this medication? Side effects that you should report to your care team as soon as possible: Allergic reactions--skin rash, itching, hives, swelling of the face, lips, tongue, or throat Bleeding--bloody or black, tar-like stools, vomiting blood or brown material that looks like coffee grounds, red or dark brown urine, small red or purple spots on skin, unusual bruising or bleeding Blood clot--pain, swelling, or warmth in the leg, shortness of breath, chest pain Dizziness, loss of balance or coordination, confusion or trouble speaking Heart attack--pain or tightness in the chest, shoulders, arms, or jaw, nausea, shortness of breath, cold or clammy skin, feeling faint or lightheaded Heart failure--shortness of breath, swelling of  the ankles, feet, or hands, sudden weight gain, unusual weakness or fatigue Heart rhythm changes--fast or irregular heartbeat, dizziness, feeling faint or lightheaded, chest pain, trouble breathing Increase in blood pressure Infection--fever, chills, cough, sore throat, wounds that don't heal, pain or trouble when passing urine, general feeling of discomfort or being unwell Infusion reactions--chest pain, shortness of breath or trouble breathing, feeling faint or lightheaded Kidney injury--decrease in the amount of urine, swelling of the ankles, hands, or feet Liver injury--right upper belly pain,  loss of appetite, nausea, light-colored stool, dark yellow or brown urine, yellowing skin or eyes, unusual weakness or fatigue Lung injury--shortness of breath or trouble breathing, cough, spitting up blood, chest pain, fever Pulmonary hypertension--shortness of breath, chest pain, fast or irregular heartbeat, feeling faint or lightheaded, fatigue, swelling of the ankles or feet Stomach pain, bloody diarrhea, pale skin, unusual weakness or fatigue, decrease in the amount of urine, which may be signs of hemolytic uremic syndrome Sudden and severe headache, confusion, change in vision, seizures, which may be signs of posterior reversible encephalopathy syndrome (PRES) TTP--purple spots on the skin or inside the mouth, pale skin, yellowing skin or eyes, unusual weakness or fatigue, fever, fast or irregular heartbeat, confusion, change in vision, trouble speaking, trouble walking Tumor lysis syndrome (TLS)--nausea, vomiting, diarrhea, decrease in the amount of urine, dark urine, unusual weakness or fatigue, confusion, muscle pain or cramps, fast or irregular heartbeat, joint pain Side effects that usually do not require medical attention (report to your care team if they continue or are bothersome): Diarrhea Fatigue Nausea Trouble sleeping This list may not describe all possible side effects. Call your doctor for medical advice about side effects. You may report side effects to FDA at 1-800-FDA-1088. Where should I keep my medication? This medication is given in a hospital or clinic. It will not be stored at home. NOTE: This sheet is a summary. It may not cover all possible information. If you have questions about this medicine, talk to your doctor, pharmacist, or health care provider.  2023 Elsevier/Gold Standard (2021-12-07 00:00:00)  

## 2022-05-15 LAB — KAPPA/LAMBDA LIGHT CHAINS
Kappa free light chain: 5.1 mg/L (ref 3.3–19.4)
Kappa, lambda light chain ratio: 1.24 (ref 0.26–1.65)
Lambda free light chains: 4.1 mg/L — ABNORMAL LOW (ref 5.7–26.3)

## 2022-05-16 ENCOUNTER — Encounter: Payer: Self-pay | Admitting: Hematology and Oncology

## 2022-05-17 LAB — MULTIPLE MYELOMA PANEL, SERUM
Albumin SerPl Elph-Mcnc: 3.3 g/dL (ref 2.9–4.4)
Albumin/Glob SerPl: 1.4 (ref 0.7–1.7)
Alpha 1: 0.3 g/dL (ref 0.0–0.4)
Alpha2 Glob SerPl Elph-Mcnc: 0.7 g/dL (ref 0.4–1.0)
B-Globulin SerPl Elph-Mcnc: 0.9 g/dL (ref 0.7–1.3)
Gamma Glob SerPl Elph-Mcnc: 0.5 g/dL (ref 0.4–1.8)
Globulin, Total: 2.4 g/dL (ref 2.2–3.9)
IgA: 21 mg/dL — ABNORMAL LOW (ref 61–437)
IgG (Immunoglobin G), Serum: 601 mg/dL — ABNORMAL LOW (ref 603–1613)
IgM (Immunoglobulin M), Srm: 19 mg/dL — ABNORMAL LOW (ref 20–172)
M Protein SerPl Elph-Mcnc: 0.2 g/dL — ABNORMAL HIGH
Total Protein ELP: 5.7 g/dL — ABNORMAL LOW (ref 6.0–8.5)

## 2022-05-19 ENCOUNTER — Other Ambulatory Visit: Payer: Self-pay

## 2022-05-19 ENCOUNTER — Inpatient Hospital Stay (HOSPITAL_BASED_OUTPATIENT_CLINIC_OR_DEPARTMENT_OTHER): Payer: Medicare (Managed Care) | Admitting: Hematology and Oncology

## 2022-05-19 ENCOUNTER — Inpatient Hospital Stay: Payer: Medicare (Managed Care)

## 2022-05-19 VITALS — BP 128/70 | HR 69 | Temp 98.9°F | Resp 18

## 2022-05-19 DIAGNOSIS — Z5111 Encounter for antineoplastic chemotherapy: Secondary | ICD-10-CM | POA: Diagnosis not present

## 2022-05-19 DIAGNOSIS — C9002 Multiple myeloma in relapse: Secondary | ICD-10-CM

## 2022-05-19 LAB — CBC WITH DIFFERENTIAL (CANCER CENTER ONLY)
Abs Immature Granulocytes: 0.04 10*3/uL (ref 0.00–0.07)
Basophils Absolute: 0 10*3/uL (ref 0.0–0.1)
Basophils Relative: 0 %
Eosinophils Absolute: 0.3 10*3/uL (ref 0.0–0.5)
Eosinophils Relative: 4 %
HCT: 31.9 % — ABNORMAL LOW (ref 39.0–52.0)
Hemoglobin: 10.9 g/dL — ABNORMAL LOW (ref 13.0–17.0)
Immature Granulocytes: 0 %
Lymphocytes Relative: 10 %
Lymphs Abs: 0.9 10*3/uL (ref 0.7–4.0)
MCH: 29.6 pg (ref 26.0–34.0)
MCHC: 34.2 g/dL (ref 30.0–36.0)
MCV: 86.7 fL (ref 80.0–100.0)
Monocytes Absolute: 0.8 10*3/uL (ref 0.1–1.0)
Monocytes Relative: 9 %
Neutro Abs: 6.8 10*3/uL (ref 1.7–7.7)
Neutrophils Relative %: 77 %
Platelet Count: 204 10*3/uL (ref 150–400)
RBC: 3.68 MIL/uL — ABNORMAL LOW (ref 4.22–5.81)
RDW: 15.9 % — ABNORMAL HIGH (ref 11.5–15.5)
WBC Count: 9 10*3/uL (ref 4.0–10.5)
nRBC: 0 % (ref 0.0–0.2)

## 2022-05-19 LAB — CMP (CANCER CENTER ONLY)
ALT: 20 U/L (ref 0–44)
AST: 12 U/L — ABNORMAL LOW (ref 15–41)
Albumin: 3.6 g/dL (ref 3.5–5.0)
Alkaline Phosphatase: 82 U/L (ref 38–126)
Anion gap: 4 — ABNORMAL LOW (ref 5–15)
BUN: 14 mg/dL (ref 8–23)
CO2: 29 mmol/L (ref 22–32)
Calcium: 9.3 mg/dL (ref 8.9–10.3)
Chloride: 110 mmol/L (ref 98–111)
Creatinine: 0.74 mg/dL (ref 0.61–1.24)
GFR, Estimated: >60 mL/min (ref >60)
Glucose, Bld: 104 mg/dL — ABNORMAL HIGH (ref 70–99)
Potassium: 4.2 mmol/L (ref 3.5–5.1)
Sodium: 143 mmol/L (ref 135–145)
Total Bilirubin: 0.7 mg/dL (ref 0.3–1.2)
Total Protein: 6.2 g/dL — ABNORMAL LOW (ref 6.5–8.1)

## 2022-05-19 LAB — URIC ACID: Uric Acid, Serum: 5.6 mg/dL (ref 3.7–8.6)

## 2022-05-19 LAB — LACTATE DEHYDROGENASE: LDH: 132 U/L (ref 98–192)

## 2022-05-19 MED ORDER — SODIUM CHLORIDE 0.9 % IV SOLN: Freq: Once | INTRAVENOUS | Status: AC

## 2022-05-19 MED ORDER — DEXTROSE 5 % IV SOLN
70.0000 mg/m2 | Freq: Once | INTRAVENOUS | Status: AC
  Administered 2022-05-19: 150 mg via INTRAVENOUS
  Filled 2022-05-19: qty 60

## 2022-05-19 MED ORDER — SODIUM CHLORIDE 0.9 % IV SOLN
40.0000 mg | Freq: Once | INTRAVENOUS | Status: AC
  Administered 2022-05-19: 40 mg via INTRAVENOUS
  Filled 2022-05-19: qty 4

## 2022-05-19 NOTE — Patient Instructions (Signed)
[facility]  Discharge Instructions: Thank you for choosing [facility] to provide your oncology and hematology care.   If you have a lab appointment with the [facility], please go directly to the [facility] and check in at the registration area.   Wear comfortable clothing and clothing appropriate for easy access to any Portacath or PICC line.   We strive to give you quality time with your provider. You may need to reschedule your appointment if you arrive late (15 or more minutes).  Arriving late affects you and other patients whose appointments are after yours.  Also, if you miss three or more appointments without notifying the office, you may be dismissed from the clinic at the provider's discretion.      For prescription refill requests, have your pharmacy contact our office and allow 72 hours for refills to be completed.    Today you received the following chemotherapy and/or immunotherapy agents kyprolis      To help prevent nausea and vomiting after your treatment, we encourage you to take your nausea medication as directed.  BELOW ARE SYMPTOMS THAT SHOULD BE REPORTED IMMEDIATELY: *FEVER GREATER THAN 100.4 F (38 C) OR HIGHER *CHILLS OR SWEATING *NAUSEA AND VOMITING THAT IS NOT CONTROLLED WITH YOUR NAUSEA MEDICATION *UNUSUAL SHORTNESS OF BREATH *UNUSUAL BRUISING OR BLEEDING *URINARY PROBLEMS (pain or burning when urinating, or frequent urination) *BOWEL PROBLEMS (unusual diarrhea, constipation, pain near the anus) TENDERNESS IN MOUTH AND THROAT WITH OR WITHOUT PRESENCE OF ULCERS (sore throat, sores in mouth, or a toothache) UNUSUAL RASH, SWELLING OR PAIN  UNUSUAL VAGINAL DISCHARGE OR ITCHING   Items with * indicate a potential emergency and should be followed up as soon as possible or go to the Emergency Department if any problems should occur.  Please show the CHEMOTHERAPY ALERT CARD or IMMUNOTHERAPY ALERT CARD at check-in to  the Emergency Department and triage nurse.  Should you have questions after your visit or need to cancel or reschedule your appointment, please contact [facility]  Dept: [phone]  and follow the prompts.  Office hours are 8:00 a.m. to 4:30 p.m. Monday - Friday. Please note that voicemails left after 4:00 p.m. may not be returned until the following business day.  We are closed weekends and major holidays. You have access to a nurse at all times for urgent questions. Please call the main number to the clinic Dept: [phone] and follow the prompts.   For any non-urgent questions, you may also contact your provider using MyChart. We now offer e-Visits for anyone 67 and older to request care online for non-urgent symptoms. For details visit [email or web address].   Also download the MyChart app! Go to the app store, search "MyChart", open the app, select [facility], and log in with your MyChart username and password.  Masks are optional in the cancer centers. If you would like for your care team to wear a mask while they are taking care of you, please let them know. You may have one support person who is at least 68 years old accompany you for your appointments.

## 2022-05-19 NOTE — Progress Notes (Signed)
[facility] [phone]   [phone]  PROGRESS NOTE  Patient Care Team: [name], MD as PCP - General (Family Medicine) [name], MD as Consulting Physician (Physical Medicine and Rehabilitation) Donia Guiles Lavon Paganini, PA-C as Physician Assistant (Physical Medicine and Rehabilitation) Orson Slick, MD as Consulting Physician (Hematology and Oncology)  Hematological/Oncological History # IgG Lambda Multiple Myeloma, Relapsed (ISS Stage II) 1) 06/2010: initial diagnosis of Multiple Myeloma after T8 compression fracture. Treated with Velcade/Revlimid/Dexamethasone and achieved a complete remission 2) Velcade was discontinued in September 2012 and that Revlimid and Decadron were discontinued in March 2013. 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 4) Followed by Dr. Alvy Bimler, last clinic visit 10/09/2019. At that time there was concern for relapse of his multiple myeloma.  5) Patient requested transfer to different provider after misunderstanding regarding imaging studies 6) 12/17/2019: transfer care to Dr. Lorenso Courier  7) 01/09/2020: Cycle 1 Day 1 of Dara/Velcade/Dex 8) 01/21/2020: presented as urgent visit for diarrhea and dehydration. Holding chemotherapy scheduled for 01/23/2020. 9) 01/30/2020: Resume dara/velcade/dex after resolution of diarrhea.  10) 02/13/2020: restaging labs show M protein 0.8, Kappa 4.5, lamba 17.2, ratio 0.26, urine M protein 53 (7.1%). All MM labs indicate improvement.  11) 03/10/2020: Cycle 4 Day 1 of Dara/Velcade/Dex. Transition to q 3 week daratumumab.  12) 06/04/2020:  Cycle 8 Day 1 of Dara/Velcade/Dex 13) 06/25/2020:  Cycle 9 Day 1 of Dara/Velcade/Dex 14) 07/22/2020: Cycle 10 Day 1 of  Dara//Dex 15) 08/18/2020: Cycle 11 Day 1 of Dara//Dex 16) 09/23/2020: Cycle 12 Day 1 of Dara//Dex 17) 10/22/2020: Cycle 13 Day 1 of Dara/Dex   18) 11/19/2020: Cycle 14 Day 1 of Dara/Dex  19) 12/17/2020: Cycle 15 Day 1 of Dara/Dex  20) 01/17/2021: Cycle 16 Day 1 of Dara/Dex  21) 02/11/2021: Cycle 17 Day 1 of Dara/Dex 22) 03/10/2021: Cycle 18 Day 1 of Dara/Dex  23) 04/08/2021: Cycle 19 Day 1 of Dara/Dex  24) 08/03/2021: Cycle 20 Day 1 of Dara/Dex (delayed due to scheduling error) 25) 09/02/2021: Cycle 21 Day 1 of Dara/Dex 26) 09/30/2021: Cycle 22 Day 1 of Dara/Dex 27) 10/28/2021: Cycle 23 Day 1 of Dara/Dex 28) 12/02/2021: Cycle 24 Day 1 of Dara/Dex 29) 12/30/2021: Cycle 25 Day 1 of Dara/Dex 30) 01/06/2022: Cycle 1 Day 1 of Kyprolis/Dex 31) 02/03/2022: Cycle 2 Day 1 of Kyprolis/Dex 32) 02/24/2022: Cycle 3 Day 1 of Kyprolis/Dex 33) 04/07/2022: Cycle 4 Day 1 of Kyprolis/Dex 34) 05/05/2022: Cycle 5 Day 1 of Kyprolis/Dex  Interval History:  [name] 66 y.o. male with medical history significant for  IgG Lambda Multiple Myeloma who presents for a follow up visit. The patient's last visit was on 05/05/2022.  He presents today to for cycle 5 day 15 of Kyprolis/Dex.   On exam today [name] reports he did have an unusual episode with headaches.  He reports that on Tuesday he was outside waiting for his peace driver to pick him up and he developed issues with his vision and had a severe headache.  He notes that he could not remember his driver's name at the time.  This reoccurred again on Thursday with very similar symptoms.  He notes that he is not usually prone to headaches and this is quite strange for him.  He has had no events since that Thursday.  He notes that he is usually "strong as ox" but is upset that  he is gaining weight and swelling in his hands.  He is using his best to exercise in bed.  He has had to take his wedding ring off due to swelling.  Otherwise has been eating well and his energy levels are quite good.  He is not having any pain anywhere.  He is tolerating the Keppra treatment without any numbness or tingling of his hands.  He  denies fevers, chills, sweats, shortness of breath, chest pain or cough.   He has no other complaints. A full 10 point ROS is listed below.  Overall he is willing and able to proceed with treatment at this time.  MEDICAL HISTORY:  Past Medical History:  Diagnosis Date   Adrenal insufficiency ([facility])    on chronic dexamethasone   Anemia    Cancer ([facility])    Coagulopathy ([facility])    on xeralto/ s/p DVT while on coumadin,  IVC in place   Diabetes mellitus without complication ([facility])    type 2   Gross hematuria 7/14   post foley cath procedure   History of blood transfusion 7/14   Multiple myeloma    thoracic T8 with paraplegia s/p resection- on chemo at visit 10/13/10   Multiple myeloma    Multiple myeloma without mention of remission    Neurogenic bladder    Neurogenic bowel    Paraplegia ([facility])    Partial small bowel obstruction ([place]) during dec 2011 admission    SURGICAL HISTORY: Past Surgical History:  Procedure Laterality Date   COLONOSCOPY WITH PROPOFOL N/A 04/12/2017   Procedure: COLONOSCOPY WITH PROPOFOL;  Surgeon: Irene Shipper, MD;  Location: [facility];  Service: Endoscopy;  Laterality: N/A;   COLONOSCOPY WITH PROPOFOL N/A 04/19/2017   Procedure: COLONOSCOPY WITH PROPOFOL;  Surgeon: Yetta Flock, MD;  Location: [facility];  Service: Gastroenterology;  Laterality: N/A;   COLOSTOMY  07/20/2011   Procedure: COLOSTOMY;  Surgeon: Judieth Keens, DO;  Location: [facility];  Service: General;;   COLOSTOMY REVISION  07/20/2011   Procedure: COLON RESECTION SIGMOID;  Surgeon: Judieth Keens, DO;  Location: [facility];  Service: General;;   CYSTOSCOPY N/A 04/04/2013   Procedure: CYSTOSCOPY WITH LITHALOPAXY;  Surgeon: Alexis Frock, MD;  Location: [facility];  Service: Urology;  Laterality: N/A;   INSERTION OF SUPRAPUBIC CATHETER N/A 04/04/2013   Procedure: INSERTION OF SUPRAPUBIC CATHETER;  Surgeon: Alexis Frock, MD;  Location: [facility];  Service: Urology;  Laterality: N/A;    LAPAROTOMY  07/20/2011   Procedure: EXPLORATORY LAPAROTOMY;  Surgeon: Judieth Keens, DO;  Location: [facility];  Service: General;  Laterality: N/A;   myeloma thoracic T8 with parpaplegia s/p thoracotomy and thoracic T7-9 cage placement on Dec 26th 2011  07/18/10    SOCIAL HISTORY: Social History   Socioeconomic History   Marital status: Married    Spouse name: Not on file   Number of children: Not on file   Years of education: Not on file   Highest education level: Not on file  Occupational History   Not on file  Tobacco Use   Smoking status: Never   Smokeless tobacco: Never  Vaping Use   Vaping Use: Never used  Substance and Sexual Activity   Alcohol use: No   Drug use: No   Sexual activity: Never  Other Topics Concern   Not on file  Social History Narrative   Not on file   Social Determinants of Health   Financial Resource Strain: Not on file  Food Insecurity:  Not on file  Transportation Needs: Not on file  Physical Activity: Not on file  Stress: Not on file  Social Connections: Not on file  Intimate Partner Violence: Not on file    FAMILY HISTORY: Family History  Problem Relation Age of Onset   Ovarian cancer Mother    Diabetes Father     ALLERGIES:  is allergic to feraheme [ferumoxytol].  MEDICATIONS:  Current Outpatient Medications  Medication Sig Dispense Refill   acyclovir (ZOVIRAX) 400 MG tablet Take 1 tablet (400 mg total) by mouth 2 (two) times daily. 60 tablet 2   atorvastatin (LIPITOR) 10 MG tablet Take 10 mg by mouth daily.     baclofen (LIORESAL) 20 MG tablet Take 20 mg by mouth 2 (two) times daily.     diazepam (DIASTAT ACUDIAL) 10 MG GEL SMARTSIG:By Mouth     dorzolamide (TRUSOPT) 2 % ophthalmic solution 1 drop 2 (two) times daily.     iron polysaccharides (NU-IRON) 150 MG capsule Take 1 capsule (150 mg total) by mouth daily. 30 capsule 0   latanoprost (XALATAN) 0.005 % ophthalmic solution Place 1 drop into both eyes at bedtime.      ondansetron (ZOFRAN) 8 MG tablet Take 1 tablet (8 mg total) by mouth every 8 (eight) hours as needed for nausea or vomiting. 30 tablet 0   rivaroxaban (XARELTO) 10 MG TABS tablet Take 1 tablet (10 mg total) by mouth daily with supper. 30 tablet 9   Skin Protectants, Misc. (EUCERIN) cream Apply 1 application topically 2 (two) times daily as needed for dry skin.      sulfamethoxazole-trimethoprim (BACTRIM DS) 800-160 MG tablet Take 1 tablet by mouth 2 (two) times daily.     zinc oxide (BALMEX) 11.3 % CREA cream Apply 1 application topically 2 (two) times daily.     No current facility-administered medications for this visit.   Facility-Administered Medications Ordered in Other Visits  Medication Dose Route Frequency Provider Last Rate Last Admin   carfilzomib (KYPROLIS) 150 mg in dextrose 5 % 100 mL chemo infusion  70 mg/m2 (Capped) Intravenous Once Ledell Peoples IV, MD       sodium chloride flush (NS) 0.9 % injection 10 mL  10 mL Intravenous PRN Alvy Bimler, Ni, MD        REVIEW OF SYSTEMS:   Constitutional: ( - ) fevers, ( - )  chills , ( - ) night sweats Eyes: ( - ) blurriness of vision, ( - ) double vision, ( - ) watery eyes Ears, nose, mouth, throat, and face: ( - ) mucositis, ( - ) sore throat Respiratory: ( - ) cough, ( - ) dyspnea, ( - ) wheezes Cardiovascular: ( - ) palpitation, ( - ) chest discomfort, ( - ) lower extremity swelling Gastrointestinal:  ( - ) nausea, ( - ) heartburn, ( - ) change in bowel habits Skin: ( - ) abnormal skin rashes Lymphatics: ( - ) new lymphadenopathy, ( - ) easy bruising Neurological: ( - ) numbness, ( - ) tingling, ( - ) new weaknesses Behavioral/Psych: ( - ) mood change, ( - ) new changes  All other systems were reviewed with the patient and are negative.  PHYSICAL EXAMINATION: ECOG PERFORMANCE STATUS: paraplegic.   Vitals:   05/19/22 1139  BP: 128/70  Pulse: 69  Resp: 18  Temp: 98.9 F (37.2 C)  SpO2: 99%       Filed Weights      GENERAL: well appearing middle aged [place] American male  alert, no distress and comfortable SKIN: skin color, texture, turgor are normal, no rashes or significant lesions EYES: conjunctiva are pink and non-injected, sclera clear LUNGS: clear to auscultation and percussion with normal breathing effort HEART: regular rate & rhythm and no murmurs and no lower extremity edema Musculoskeletal: no cyanosis of digits and no clubbing  PSYCH: alert & oriented x 3, fluent speech NEURO: paraplegic, no use of LE bilaterally.   LABORATORY DATA:  I have reviewed the data as listed    Latest Ref Rng & Units 05/19/2022   11:14 AM 05/12/2022    9:28 AM 05/05/2022   11:26 AM  CBC  WBC 4.0 - 10.5 K/uL 9.0  8.5  6.4   Hemoglobin 13.0 - 17.0 g/dL 10.9  11.5  12.5   Hematocrit 39.0 - 52.0 % 31.9  33.3  35.4   Platelets 150 - 400 K/uL 204  191  453        Latest Ref Rng & Units 05/19/2022   11:14 AM 05/12/2022    9:28 AM 05/05/2022   11:26 AM  CMP  Glucose 70 - 99 mg/dL 104  107  120   BUN 8 - 23 mg/dL _0 Creatinine 0.61 - 1.24 mg/dL 0.74  0.74  1.02   Sodium 135 - 145 mmol/L 143  142  139   Potassium 3.5 - 5.1 mmol/L 4.2  4.0  4.4   Chloride 98 - 111 mmol/L 110  109  109   CO2 22 - 32 mmol/L _1 Calcium 8.9 - 10.3 mg/dL 9.3  9.5  9.4   Total Protein 6.5 - 8.1 g/dL 6.2  6.1  7.1   Total Bilirubin 0.3 - 1.2 mg/dL 0.7  0.7  0.6   Alkaline Phos 38 - 126 U/L 82  87  100   AST 15 - 41 U/L _2 ALT 0 - 44 U/L _3 Lab Results  Component Value Date   MPROTEIN 0.2 (H) 05/12/2022   MPROTEIN 0.3 (H) 05/05/2022   MPROTEIN 0.2 (H) 04/14/2022   Lab Results  Component Value Date   KPAFRELGTCHN 5.1 05/12/2022   KPAFRELGTCHN 10.3 05/05/2022   KPAFRELGTCHN 6.3 04/14/2022   LAMBDASER 4.1 (L) 05/12/2022   LAMBDASER 7.0 05/05/2022   LAMBDASER 7.3 04/14/2022   KAPLAMBRATIO 1.24 05/12/2022   KAPLAMBRATIO 1.47 05/05/2022   KAPLAMBRATIO 0.86 04/14/2022     RADIOGRAPHIC STUDIES: No results found.  ASSESSMENT & PLAN [name] 66 y.o. male with medical history significant for  IgG Lambda Multiple Myeloma who presents for a follow up visit.  After review of the labs, discussion with the patient, and reviewed the imaging his findings are most consistent with a relapsed multiple myeloma.The patient has had excellent success before with treatment of his myeloma with Velcade, Revlimid, and dexamethasone.  Transition to daratumumab therapy and was on a once monthly treatments but unfortunately developed progression of disease.  We have transitioned to Kyprolis/Dex.  # IgG Lambda Multiple Myeloma, Relapsed (ISS Stage II) --findings are most consistent with relapsed multiple myeloma. Patient previously successfully treated with Velcade/Rev/Dex and Daratumumab/Velcade/Dex. On 07/02/2020 he transitioned to monthly daratumumab alone.  --due to to rise in M protein, switched to Kyprolis and Dexamethasone on 01/06/2022 Plan: --labs today were reviewed and adequate for treatment.  --Labs show white blood cell count 9.0, hemoglobin 10.9, MCV 86.7 and platelets of  204. Myeloma labs from 05/05/2022 showed M protein dropped to 0.2 with normalization of kappa lambda ratio to 1.24 --patient will proceed with Cycle 5 Day 15 Kyprolis today.  --return for weekly Kyprolis and clinic visit  in 2 weeks.   #History of DVT --He had placement of IVC filter, remains on Xarelto. --Due to poor mobility, and lack of bleeding complications, I recommend he remain on Xarelto indefinitely. --caution if Plt count were to drop <50  #Snoring/Risk for OSA: --Made referral to sleep center to test for OSA.   # Supportive Care -- provided patient with an albuterol inhaler (for use with daratumumab) --acyclovir 411m BID for VZV prophylaxis --zofran [id number] PRN for nausea/vomiting  --Zometa is being held in the setting of his prior episode of osteonecrosis of the jaw  Orders  Placed This Encounter  Procedures   Multiple Myeloma Panel (SPEP&IFE w/QIG)    Standing Status:   Future    Standing Expiration Date:   06/30/2023   Kappa/lambda light chains    Standing Status:   Future    Standing Expiration Date:   06/30/2023   CBC with Differential ([facility])    Standing Status:   Future    Standing Expiration Date:   07/01/2023   CMP ([facility])    Standing Status:   Future    Standing Expiration Date:   07/01/2023   CBC with Differential ([facility])    Standing Status:   Future    Standing Expiration Date:   07/08/2023   CMP ([facility])    Standing Status:   Future    Standing Expiration Date:   07/08/2023   CBC with Differential ([facility])    Standing Status:   Future    Standing Expiration Date:   07/15/2023   CMP ([facility])    Standing Status:   Future    Standing Expiration Date:   07/15/2023   Multiple Myeloma Panel (SPEP&IFE w/QIG)    Standing Status:   Future    Standing Expiration Date:   07/28/2023   Kappa/lambda light chains    Standing Status:   Future    Standing Expiration Date:   07/28/2023   CBC with Differential ([facility])    Standing Status:   Future    Standing Expiration Date:   07/29/2023   CMP ([facility])    Standing Status:   Future    Standing Expiration Date:   07/29/2023   CBC with Differential ([facility])    Standing Status:   Future    Standing Expiration Date:   08/05/2023   CMP ([facility])    Standing Status:   Future    Standing Expiration Date:   08/05/2023   CBC with Differential ([facility])    Standing Status:   Future    Standing Expiration Date:   08/12/2023   CMP ([facility])    Standing Status:   Future    Standing Expiration Date:   08/12/2023    All questions were answered. The patient knows to call the clinic with any problems, questions or concerns.  I have spent a total of 30 minutes minutes of  face-to-face and non-face-to-face time, preparing to see the patient,  performing a medically appropriate examination, counseling and educating the patient, communicating with other health care professionals, documenting clinical information in the electronic health record,  and care coordination.   JoLedell Peoples  MD Department of Hematology/Oncology [facility] Phone: [phone] Pager: [phone] Email: [email or web address]   05/19/2022 1:48 PM   Literature Support:  Darrin Nipper, Petrucci MT, [place], [place], [place], [place], Spada S, [place], Ponticelli E, [place], Cavo M, Di Toritto TC, Shela Nevin F, [place] V, Palumbo A, Boccadoro M, Larocca A. Once-weekly versus twice-weekly carfilzomib in patients with newly diagnosed multiple myeloma: a pooled analysis of two phase I/II studies. Haematologica. 2019 Aug;104(8):1640-1647.  --Once-weekly 70 mg/m2 carfilzomib as induction and maintenance therapy for newly diagnosed multiple myeloma patients was as safe and effective as twice-weekly 36 mg/m2 carfilzomib and provided a more convenient schedule.

## 2022-05-20 ENCOUNTER — Other Ambulatory Visit: Payer: Self-pay

## 2022-05-22 ENCOUNTER — Telehealth: Payer: Self-pay | Admitting: Hematology and Oncology

## 2022-05-22 NOTE — Telephone Encounter (Signed)
Per workque called and spoke to pt wife about appointments

## 2022-05-30 ENCOUNTER — Other Ambulatory Visit: Payer: Self-pay

## 2022-06-01 ENCOUNTER — Inpatient Hospital Stay: Payer: Medicare (Managed Care) | Attending: Physician Assistant

## 2022-06-01 ENCOUNTER — Inpatient Hospital Stay: Payer: Medicare (Managed Care)

## 2022-06-01 ENCOUNTER — Other Ambulatory Visit: Payer: Self-pay

## 2022-06-01 ENCOUNTER — Inpatient Hospital Stay (HOSPITAL_BASED_OUTPATIENT_CLINIC_OR_DEPARTMENT_OTHER): Payer: Medicare (Managed Care) | Admitting: Hematology and Oncology

## 2022-06-01 VITALS — BP 130/76 | HR 87 | Temp 98.8°F | Resp 18

## 2022-06-01 DIAGNOSIS — C9002 Multiple myeloma in relapse: Secondary | ICD-10-CM | POA: Insufficient documentation

## 2022-06-01 DIAGNOSIS — Z86718 Personal history of other venous thrombosis and embolism: Secondary | ICD-10-CM | POA: Diagnosis not present

## 2022-06-01 DIAGNOSIS — Z7901 Long term (current) use of anticoagulants: Secondary | ICD-10-CM | POA: Insufficient documentation

## 2022-06-01 DIAGNOSIS — E119 Type 2 diabetes mellitus without complications: Secondary | ICD-10-CM | POA: Diagnosis not present

## 2022-06-01 DIAGNOSIS — Z5111 Encounter for antineoplastic chemotherapy: Secondary | ICD-10-CM | POA: Insufficient documentation

## 2022-06-01 LAB — CBC WITH DIFFERENTIAL (CANCER CENTER ONLY)
Abs Immature Granulocytes: 0.1 10*3/uL — ABNORMAL HIGH (ref 0.00–0.07)
Basophils Absolute: 0.1 10*3/uL (ref 0.0–0.1)
Basophils Relative: 1 %
Eosinophils Absolute: 0.3 10*3/uL (ref 0.0–0.5)
Eosinophils Relative: 3 %
HCT: 33.1 % — ABNORMAL LOW (ref 39.0–52.0)
Hemoglobin: 11.5 g/dL — ABNORMAL LOW (ref 13.0–17.0)
Immature Granulocytes: 1 %
Lymphocytes Relative: 13 %
Lymphs Abs: 1 10*3/uL (ref 0.7–4.0)
MCH: 29.9 pg (ref 26.0–34.0)
MCHC: 34.7 g/dL (ref 30.0–36.0)
MCV: 86 fL (ref 80.0–100.0)
Monocytes Absolute: 0.7 10*3/uL (ref 0.1–1.0)
Monocytes Relative: 9 %
Neutro Abs: 5.5 10*3/uL (ref 1.7–7.7)
Neutrophils Relative %: 73 %
Platelet Count: 445 10*3/uL — ABNORMAL HIGH (ref 150–400)
RBC: 3.85 MIL/uL — ABNORMAL LOW (ref 4.22–5.81)
RDW: 15.7 % — ABNORMAL HIGH (ref 11.5–15.5)
WBC Count: 7.7 10*3/uL (ref 4.0–10.5)
nRBC: 0 % (ref 0.0–0.2)

## 2022-06-01 LAB — CMP (CANCER CENTER ONLY)
ALT: 16 U/L (ref 0–44)
AST: 13 U/L — ABNORMAL LOW (ref 15–41)
Albumin: 3.9 g/dL (ref 3.5–5.0)
Alkaline Phosphatase: 85 U/L (ref 38–126)
Anion gap: 6 (ref 5–15)
BUN: 13 mg/dL (ref 8–23)
CO2: 24 mmol/L (ref 22–32)
Calcium: 9 mg/dL (ref 8.9–10.3)
Chloride: 114 mmol/L — ABNORMAL HIGH (ref 98–111)
Creatinine: 0.72 mg/dL (ref 0.61–1.24)
GFR, Estimated: >60 mL/min (ref >60)
Glucose, Bld: 148 mg/dL — ABNORMAL HIGH (ref 70–99)
Potassium: 3.9 mmol/L (ref 3.5–5.1)
Sodium: 144 mmol/L (ref 135–145)
Total Bilirubin: 0.6 mg/dL (ref 0.3–1.2)
Total Protein: 6.5 g/dL (ref 6.5–8.1)

## 2022-06-01 LAB — LACTATE DEHYDROGENASE: LDH: 136 U/L (ref 98–192)

## 2022-06-01 LAB — URIC ACID: Uric Acid, Serum: 5.4 mg/dL (ref 3.7–8.6)

## 2022-06-01 MED ORDER — DEXTROSE 5 % IV SOLN
70.0000 mg/m2 | Freq: Once | INTRAVENOUS | Status: AC
  Administered 2022-06-01: 150 mg via INTRAVENOUS
  Filled 2022-06-01: qty 60

## 2022-06-01 MED ORDER — SODIUM CHLORIDE 0.9 % IV SOLN
40.0000 mg | Freq: Once | INTRAVENOUS | Status: AC
  Administered 2022-06-01: 40 mg via INTRAVENOUS
  Filled 2022-06-01: qty 4

## 2022-06-01 MED ORDER — SODIUM CHLORIDE 0.9 % IV SOLN: Freq: Once | INTRAVENOUS | Status: AC

## 2022-06-01 NOTE — Progress Notes (Signed)
[name] [phone]   [phone]  PROGRESS NOTE  Patient Care Team: [name], MD as PCP - General (Family Medicine) [name], MD as Consulting Physician (Physical Medicine and Rehabilitation) Donia Guiles Lavon Paganini, PA-C as Physician Assistant (Physical Medicine and Rehabilitation) Orson Slick, MD as Consulting Physician (Hematology and Oncology)  Hematological/Oncological History # IgG Lambda Multiple Myeloma, Relapsed (ISS Stage II) 1) 06/2010: initial diagnosis of Multiple Myeloma after T8 compression fracture. Treated with Velcade/Revlimid/Dexamethasone and achieved a complete remission 2) Velcade was discontinued in September 2012 and that Revlimid and Decadron were discontinued in March 2013. 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 4) Followed by Dr. Alvy Bimler, last clinic visit 10/09/2019. At that time there was concern for relapse of his multiple myeloma.  5) Patient requested transfer to different provider after misunderstanding regarding imaging studies 6) 12/17/2019: transfer care to Dr. Lorenso Courier  7) 01/09/2020: Cycle 1 Day 1 of Dara/Velcade/Dex 8) 01/21/2020: presented as urgent visit for diarrhea and dehydration. Holding chemotherapy scheduled for 01/23/2020. 9) 01/30/2020: Resume dara/velcade/dex after resolution of diarrhea.  10) 02/13/2020: restaging labs show M protein 0.8, Kappa 4.5, lamba 17.2, ratio 0.26, urine M protein 53 (7.1%). All MM labs indicate improvement.  11) 03/10/2020: Cycle 4 Day 1 of Dara/Velcade/Dex. Transition to q 3 week daratumumab.  12) 06/04/2020:  Cycle 8 Day 1 of Dara/Velcade/Dex 13) 06/25/2020:  Cycle 9 Day 1 of Dara/Velcade/Dex 14) 07/22/2020: Cycle 10 Day 1 of  Dara//Dex 15) 08/18/2020: Cycle 11 Day 1 of Dara//Dex 16) 09/23/2020: Cycle 12 Day 1 of Dara//Dex 17) 10/22/2020: Cycle 13 Day 1 of Dara/Dex   18) 11/19/2020: Cycle 14 Day 1 of Dara/Dex  19) 12/17/2020: Cycle 15 Day 1 of Dara/Dex  20) 01/17/2021: Cycle 16 Day 1 of Dara/Dex  21) 02/11/2021: Cycle 17 Day 1 of Dara/Dex 22) 03/10/2021: Cycle 18 Day 1 of Dara/Dex  23) 04/08/2021: Cycle 19 Day 1 of Dara/Dex  24) 08/03/2021: Cycle 20 Day 1 of Dara/Dex (delayed due to scheduling error) 25) 09/02/2021: Cycle 21 Day 1 of Dara/Dex 26) 09/30/2021: Cycle 22 Day 1 of Dara/Dex 27) 10/28/2021: Cycle 23 Day 1 of Dara/Dex 28) 12/02/2021: Cycle 24 Day 1 of Dara/Dex 29) 12/30/2021: Cycle 25 Day 1 of Dara/Dex 30) 01/06/2022: Cycle 1 Day 1 of Kyprolis/Dex 31) 02/03/2022: Cycle 2 Day 1 of Kyprolis/Dex 32) 02/24/2022: Cycle 3 Day 1 of Kyprolis/Dex 33) 04/07/2022: Cycle 4 Day 1 of Kyprolis/Dex 34) 05/05/2022: Cycle 5 Day 1 of Kyprolis/Dex 35) 06/01/2022: Cycle 6 Day 1 of Kyprolis/Dex  Interval History:  [name] 66 y.o. male with medical history significant for  IgG Lambda Multiple Myeloma who presents for a follow up visit. The patient's last visit was on 05/19/2022.  He presents today to for cycle 6 day 1 of Kyprolis/Dex.   On exam today [name] reports he is tolerating the treatment quite well without any major side effects.  He reports that he is not having any tingling of his fingers.  He notes that he is also not having any trouble with nausea, vomiting, or diarrhea.  He does have some occasional swelling of the hands.  His appetite is good and his energy levels are strong.  Interestingly he does have a temperature of 100.4 F today but is not having any other symptoms such as tachycardia, leukocytosis, or shortness of breath..  He is also not having any  focal infectious symptoms such as sore throat, cough, or urinary symptoms.  Overall he feels well.Marland Kitchen  He denies fevers, chills, sweats, shortness of breath, chest pain or cough.   He has no other complaints. A full 10 point ROS is listed below.  He is willing and able to proceed with treatment at this  time.  MEDICAL HISTORY:  Past Medical History:  Diagnosis Date   Adrenal insufficiency ([place])    on chronic dexamethasone   Anemia    Cancer ([place])    Coagulopathy ([place])    on xeralto/ s/p DVT while on coumadin,  IVC in place   Diabetes mellitus without complication ([place])    type 2   Gross hematuria 7/14   post foley cath procedure   History of blood transfusion 7/14   Multiple myeloma    thoracic T8 with paraplegia s/p resection- on chemo at visit 10/13/10   Multiple myeloma    Multiple myeloma without mention of remission    Neurogenic bladder    Neurogenic bowel    Paraplegia ([place])    Partial small bowel obstruction ([place]) during dec 2011 admission    SURGICAL HISTORY: Past Surgical History:  Procedure Laterality Date   COLONOSCOPY WITH PROPOFOL N/A 04/12/2017   Procedure: COLONOSCOPY WITH PROPOFOL;  Surgeon: Irene Shipper, MD;  Location: [facility];  Service: Endoscopy;  Laterality: N/A;   COLONOSCOPY WITH PROPOFOL N/A 04/19/2017   Procedure: COLONOSCOPY WITH PROPOFOL;  Surgeon: Yetta Flock, MD;  Location: [facility];  Service: Gastroenterology;  Laterality: N/A;   COLOSTOMY  07/20/2011   Procedure: COLOSTOMY;  Surgeon: Judieth Keens, DO;  Location: [facility];  Service: General;;   COLOSTOMY REVISION  07/20/2011   Procedure: COLON RESECTION SIGMOID;  Surgeon: Judieth Keens, DO;  Location: [facility];  Service: General;;   CYSTOSCOPY N/A 04/04/2013   Procedure: CYSTOSCOPY WITH LITHALOPAXY;  Surgeon: Alexis Frock, MD;  Location: [facility];  Service: Urology;  Laterality: N/A;   INSERTION OF SUPRAPUBIC CATHETER N/A 04/04/2013   Procedure: INSERTION OF SUPRAPUBIC CATHETER;  Surgeon: Alexis Frock, MD;  Location: [facility];  Service: Urology;  Laterality: N/A;   LAPAROTOMY  07/20/2011   Procedure: EXPLORATORY LAPAROTOMY;  Surgeon: Judieth Keens, DO;  Location: [facility];  Service: General;  Laterality: N/A;   myeloma thoracic T8 with parpaplegia s/p thoracotomy and  thoracic T7-9 cage placement on Dec 26th 2011  07/18/10    SOCIAL HISTORY: Social History   Socioeconomic History   Marital status: Married    Spouse name: Not on file   Number of children: Not on file   Years of education: Not on file   Highest education level: Not on file  Occupational History   Not on file  Tobacco Use   Smoking status: Never   Smokeless tobacco: Never  Vaping Use   Vaping Use: Never used  Substance and Sexual Activity   Alcohol use: No   Drug use: No   Sexual activity: Never  Other Topics Concern   Not on file  Social History Narrative   Not on file   Social Determinants of Health   Financial Resource Strain: Not on file  Food Insecurity: Not on file  Transportation Needs: Not on file  Physical Activity: Not on file  Stress: Not on file  Social Connections: Not on file  Intimate Partner Violence: Not on file    FAMILY HISTORY: Family History  Problem Relation Age of Onset   Ovarian cancer Mother  Diabetes Father     ALLERGIES:  is allergic to feraheme [ferumoxytol].  MEDICATIONS:  Current Outpatient Medications  Medication Sig Dispense Refill   acyclovir (ZOVIRAX) 400 MG tablet Take 1 tablet (400 mg total) by mouth 2 (two) times daily. 60 tablet 2   atorvastatin (LIPITOR) 10 MG tablet Take 10 mg by mouth daily.     baclofen (LIORESAL) 20 MG tablet Take 20 mg by mouth 2 (two) times daily.     diazepam (DIASTAT ACUDIAL) 10 MG GEL SMARTSIG:By Mouth     dorzolamide (TRUSOPT) 2 % ophthalmic solution 1 drop 2 (two) times daily.     iron polysaccharides (NU-IRON) 150 MG capsule Take 1 capsule (150 mg total) by mouth daily. 30 capsule 0   latanoprost (XALATAN) 0.005 % ophthalmic solution Place 1 drop into both eyes at bedtime.     ondansetron (ZOFRAN) 8 MG tablet Take 1 tablet (8 mg total) by mouth every 8 (eight) hours as needed for nausea or vomiting. 30 tablet 0   rivaroxaban (XARELTO) 10 MG TABS tablet Take 1 tablet (10 mg total) by mouth  daily with supper. 30 tablet 9   Skin Protectants, Misc. (EUCERIN) cream Apply 1 application topically 2 (two) times daily as needed for dry skin.      sulfamethoxazole-trimethoprim (BACTRIM DS) 800-160 MG tablet Take 1 tablet by mouth 2 (two) times daily.     zinc oxide (BALMEX) 11.3 % CREA cream Apply 1 application topically 2 (two) times daily.     No current facility-administered medications for this visit.   Facility-Administered Medications Ordered in Other Visits  Medication Dose Route Frequency Provider Last Rate Last Admin   sodium chloride flush (NS) 0.9 % injection 10 mL  10 mL Intravenous PRN Alvy Bimler, Ni, MD        REVIEW OF SYSTEMS:   Constitutional: ( - ) fevers, ( - )  chills , ( - ) night sweats Eyes: ( - ) blurriness of vision, ( - ) double vision, ( - ) watery eyes Ears, nose, mouth, throat, and face: ( - ) mucositis, ( - ) sore throat Respiratory: ( - ) cough, ( - ) dyspnea, ( - ) wheezes Cardiovascular: ( - ) palpitation, ( - ) chest discomfort, ( - ) lower extremity swelling Gastrointestinal:  ( - ) nausea, ( - ) heartburn, ( - ) change in bowel habits Skin: ( - ) abnormal skin rashes Lymphatics: ( - ) new lymphadenopathy, ( - ) easy bruising Neurological: ( - ) numbness, ( - ) tingling, ( - ) new weaknesses Behavioral/Psych: ( - ) mood change, ( - ) new changes  All other systems were reviewed with the patient and are negative.  PHYSICAL EXAMINATION: ECOG PERFORMANCE STATUS: paraplegic.   Vitals:   06/01/22 1016  BP: 120/79  Pulse: 77  Resp: 18  Temp: (!) 100.4 F (38 C)  SpO2: 100%       Filed Weights   06/01/22 1016  Weight: 250 lb (113.4 kg)     GENERAL: well appearing middle aged [place] American male alert, no distress and comfortable SKIN: skin color, texture, turgor are normal, no rashes or significant lesions EYES: conjunctiva are pink and non-injected, sclera clear LUNGS: clear to auscultation and percussion with normal breathing  effort HEART: regular rate & rhythm and no murmurs and no lower extremity edema Musculoskeletal: no cyanosis of digits and no clubbing  PSYCH: alert & oriented x 3, fluent speech NEURO: paraplegic, no use of LE bilaterally.  LABORATORY DATA:  I have reviewed the data as listed    Latest Ref Rng & Units 06/01/2022    9:17 AM 05/19/2022   11:14 AM 05/12/2022    9:28 AM  CBC  WBC 4.0 - 10.5 K/uL 7.7  9.0  8.5   Hemoglobin 13.0 - 17.0 g/dL 11.5  10.9  11.5   Hematocrit 39.0 - 52.0 % 33.1  31.9  33.3   Platelets 150 - 400 K/uL 445  204  191        Latest Ref Rng & Units 06/01/2022    9:17 AM 05/19/2022   11:14 AM 05/12/2022    9:28 AM  CMP  Glucose 70 - 99 mg/dL 148  104  107   BUN 8 - 23 mg/dL _0 Creatinine 0.61 - 1.24 mg/dL 0.72  0.74  0.74   Sodium 135 - 145 mmol/L 144  143  142   Potassium 3.5 - 5.1 mmol/L 3.9  4.2  4.0   Chloride 98 - 111 mmol/L 114  110  109   CO2 22 - 32 mmol/L _1 Calcium 8.9 - 10.3 mg/dL 9.0  9.3  9.5   Total Protein 6.5 - 8.1 g/dL 6.5  6.2  6.1   Total Bilirubin 0.3 - 1.2 mg/dL 0.6  0.7  0.7   Alkaline Phos 38 - 126 U/L 85  82  87   AST 15 - 41 U/L _2 ALT 0 - 44 U/L _3 Lab Results  Component Value Date   MPROTEIN 0.2 (H) 05/12/2022   MPROTEIN 0.3 (H) 05/05/2022   MPROTEIN 0.2 (H) 04/14/2022   Lab Results  Component Value Date   KPAFRELGTCHN 5.1 05/12/2022   KPAFRELGTCHN 10.3 05/05/2022   KPAFRELGTCHN 6.3 04/14/2022   LAMBDASER 4.1 (L) 05/12/2022   LAMBDASER 7.0 05/05/2022   LAMBDASER 7.3 04/14/2022   KAPLAMBRATIO 1.24 05/12/2022   KAPLAMBRATIO 1.47 05/05/2022   KAPLAMBRATIO 0.86 04/14/2022    RADIOGRAPHIC STUDIES: No results found.  ASSESSMENT & PLAN [name] 66 y.o. male with medical history significant for  IgG Lambda Multiple Myeloma who presents for a follow up visit.  After review of the labs, discussion with the patient, and reviewed the imaging his findings are most consistent  with a relapsed multiple myeloma.The patient has had excellent success before with treatment of his myeloma with Velcade, Revlimid, and dexamethasone.  Transition to daratumumab therapy and was on a once monthly treatments but unfortunately developed progression of disease.  We have transitioned to Kyprolis/Dex.  # IgG Lambda Multiple Myeloma, Relapsed (ISS Stage II) --findings are most consistent with relapsed multiple myeloma. Patient previously successfully treated with Velcade/Rev/Dex and Daratumumab/Velcade/Dex. On 07/02/2020 he transitioned to monthly daratumumab alone.  --due to to rise in M protein, switched to Kyprolis and Dexamethasone on 01/06/2022 Plan: --labs today were reviewed and adequate for treatment.  --Labs show white blood cell count 7.7, hemoglobin 1.5, MCV 86, and platelets of 445. Myeloma labs from 05/05/2022 showed M protein dropped to 0.2 with normalization of kappa lambda ratio to 1.24 --patient will proceed with Cycle 6 Day 1 Kyprolis today.  --return for weekly Kyprolis and clinic visit  in 2 weeks.   #History of DVT --He had placement of IVC filter, remains on Xarelto. --Due to poor mobility, and lack of bleeding complications, I recommend he remain on Xarelto indefinitely. --caution  if Plt count were to drop <50  #Snoring/Risk for OSA: --Made referral to sleep center to test for OSA.   # Supportive Care -- provided patient with an albuterol inhaler (for use with daratumumab) --acyclovir 440m BID for VZV prophylaxis --zofran [id number] PRN for nausea/vomiting  --Zometa is being held in the setting of his prior episode of osteonecrosis of the jaw  No orders of the defined types were placed in this encounter.   All questions were answered. The patient knows to call the clinic with any problems, questions or concerns.  I have spent a total of 30 minutes minutes of face-to-face and non-face-to-face time, preparing to see the patient,  performing a medically  appropriate examination, counseling and educating the patient, communicating with other health care professionals, documenting clinical information in the electronic health record,  and care coordination.   JoLedell PeoplesMD Department of Hematology/Oncology CoBlue [facility] Manort WeHealth[facility] Of For[facility] Worthhone: [phone]: [phone]: [email or web address]   06/01/2022 2:11 PM   Literature Support:  BrDarrin NipperPetrucci MT, GaRossfordBaObionMuGila CrossingOfGarrettSpada S, BeCeruleanPonticelli E, GaBulpittCavo M, Di Toritto TC, DiShela Nevin, Montefusco V, Palumbo A, Boccadoro M, Larocca A. Once-weekly versus twice-weekly carfilzomib in patients with newly diagnosed multiple myeloma: a pooled analysis of two phase I/II studies. Haematologica. 2019 Aug;104(8):1640-1647.  --Once-weekly 70 mg/m2 carfilzomib as induction and maintenance therapy for newly diagnosed multiple myeloma patients was as safe and effective as twice-weekly 36 mg/m2 carfilzomib and provided a more convenient schedule.

## 2022-06-01 NOTE — Patient Instructions (Signed)
[facility]  Discharge Instructions: Thank you for choosing [facility] to provide your oncology and hematology care.   If you have a lab appointment with the [facility], please go directly to the [facility] and check in at the registration area.   Wear comfortable clothing and clothing appropriate for easy access to any Portacath or PICC line.   We strive to give you quality time with your provider. You may need to reschedule your appointment if you arrive late (15 or more minutes).  Arriving late affects you and other patients whose appointments are after yours.  Also, if you miss three or more appointments without notifying the office, you may be dismissed from the clinic at the provider's discretion.      For prescription refill requests, have your pharmacy contact our office and allow 72 hours for refills to be completed.    Today you received the following chemotherapy and/or immunotherapy agent: Carfilzomib (Kyprolis)   To help prevent nausea and vomiting after your treatment, we encourage you to take your nausea medication as directed.  BELOW ARE SYMPTOMS THAT SHOULD BE REPORTED IMMEDIATELY: *FEVER GREATER THAN 100.4 F (38 C) OR HIGHER *CHILLS OR SWEATING *NAUSEA AND VOMITING THAT IS NOT CONTROLLED WITH YOUR NAUSEA MEDICATION *UNUSUAL SHORTNESS OF BREATH *UNUSUAL BRUISING OR BLEEDING *URINARY PROBLEMS (pain or burning when urinating, or frequent urination) *BOWEL PROBLEMS (unusual diarrhea, constipation, pain near the anus) TENDERNESS IN MOUTH AND THROAT WITH OR WITHOUT PRESENCE OF ULCERS (sore throat, sores in mouth, or a toothache) UNUSUAL RASH, SWELLING OR PAIN  UNUSUAL VAGINAL DISCHARGE OR ITCHING   Items with * indicate a potential emergency and should be followed up as soon as possible or go to the Emergency Department if any problems should occur.  Please show the CHEMOTHERAPY ALERT CARD or IMMUNOTHERAPY ALERT CARD at  check-in to the Emergency Department and triage nurse.  Should you have questions after your visit or need to cancel or reschedule your appointment, please contact [facility]  Dept: [phone]  and follow the prompts.  Office hours are 8:00 a.m. to 4:30 p.m. Monday - Friday. Please note that voicemails left after 4:00 p.m. may not be returned until the following business day.  We are closed weekends and major holidays. You have access to a nurse at all times for urgent questions. Please call the main number to the clinic Dept: [phone] and follow the prompts.   For any non-urgent questions, you may also contact your provider using MyChart. We now offer e-Visits for anyone 10 and older to request care online for non-urgent symptoms. For details visit [email or web address].   Also download the MyChart app! Go to the app store, search "MyChart", open the app, select [facility], and log in with your MyChart username and password.  Masks are optional in the cancer centers. If you would like for your care team to wear a mask while they are taking care of you, please let them know. You may have one support person who is at least 68 years old accompany you for your appointments. Carfilzomib Injection What is this medication? CARFILZOMIB (kar FILZ oh mib) treats multiple myeloma, a type of bone marrow cancer. It works by blocking a protein that causes cancer cells to grow and multiply. This helps to slow or stop the spread of cancer cells. This medicine may be used for other purposes; ask your health care provider or pharmacist if you have questions.  COMMON BRAND NAME(S): KYPROLIS What should I tell my care team before I take this medication? They need to know if you have any of these conditions: Heart disease History of blood clots Irregular heartbeat Kidney disease Liver disease Lung or breathing disease An unusual or allergic reaction to carfilzomib, or other  medications, foods, dyes, or preservatives If you or your partner are pregnant or trying to get pregnant Breastfeeding How should I use this medication? This medication is injected into a vein. It is given by your care team in a hospital or clinic setting. Talk to your care team about the use of this medication in children. Special care may be needed. Overdosage: If you think you have taken too much of this medicine contact a poison control center or emergency room at once. NOTE: This medicine is only for you. Do not share this medicine with others. What if I miss a dose? Keep appointments for follow-up doses. It is important not to miss your dose. Call your care team if you are unable to keep an appointment. What may interact with this medication? Interactions are not expected. This list may not describe all possible interactions. Give your health care provider a list of all the medicines, herbs, non-prescription drugs, or dietary supplements you use. Also tell them if you smoke, drink alcohol, or use illegal drugs. Some items may interact with your medicine. What should I watch for while using this medication? Your condition will be monitored carefully while you are receiving this medication. You may need blood work while taking this medication. Check with your care team if you have severe diarrhea, nausea, and vomiting, or if you sweat a lot. The loss of too much body fluid may make it dangerous for you to take this medication. This medication may affect your coordination, reaction time, or judgment. Do not drive or operate machinery until you know how this medication affects you. Sit up or stand slowly to reduce the risk of dizzy or fainting spells. Drinking alcohol with this medication can increase the risk of these side effects. Talk to your care team if you may be pregnant. Serious birth defects can occur if you take this medication during pregnancy and for 6 months after the last dose. You  will need a negative pregnancy test before starting this medication. Contraception is recommended while taking this medication and for 6 months after the last dose. Your care team can help you find an option that works for you. If your partner can get pregnant, use a condom during sex while taking this medication and for 3 months after the last dose. Do not breastfeed while taking this medication and for 2 weeks after the last dose. This medication may cause infertility. Talk to your care team if you are concerned about your fertility. What side effects may I notice from receiving this medication? Side effects that you should report to your care team as soon as possible: Allergic reactions--skin rash, itching, hives, swelling of the face, lips, tongue, or throat Bleeding--bloody or black, tar-like stools, vomiting blood or brown material that looks like coffee grounds, red or dark brown urine, small red or purple spots on skin, unusual bruising or bleeding Blood clot--pain, swelling, or warmth in the leg, shortness of breath, chest pain Dizziness, loss of balance or coordination, confusion or trouble speaking Heart attack--pain or tightness in the chest, shoulders, arms, or jaw, nausea, shortness of breath, cold or clammy skin, feeling faint or lightheaded Heart failure--shortness of breath, swelling of  the ankles, feet, or hands, sudden weight gain, unusual weakness or fatigue Heart rhythm changes--fast or irregular heartbeat, dizziness, feeling faint or lightheaded, chest pain, trouble breathing Increase in blood pressure Infection--fever, chills, cough, sore throat, wounds that don't heal, pain or trouble when passing urine, general feeling of discomfort or being unwell Infusion reactions--chest pain, shortness of breath or trouble breathing, feeling faint or lightheaded Kidney injury--decrease in the amount of urine, swelling of the ankles, hands, or feet Liver injury--right upper belly pain,  loss of appetite, nausea, light-colored stool, dark yellow or brown urine, yellowing skin or eyes, unusual weakness or fatigue Lung injury--shortness of breath or trouble breathing, cough, spitting up blood, chest pain, fever Pulmonary hypertension--shortness of breath, chest pain, fast or irregular heartbeat, feeling faint or lightheaded, fatigue, swelling of the ankles or feet Stomach pain, bloody diarrhea, pale skin, unusual weakness or fatigue, decrease in the amount of urine, which may be signs of hemolytic uremic syndrome Sudden and severe headache, confusion, change in vision, seizures, which may be signs of posterior reversible encephalopathy syndrome (PRES) TTP--purple spots on the skin or inside the mouth, pale skin, yellowing skin or eyes, unusual weakness or fatigue, fever, fast or irregular heartbeat, confusion, change in vision, trouble speaking, trouble walking Tumor lysis syndrome (TLS)--nausea, vomiting, diarrhea, decrease in the amount of urine, dark urine, unusual weakness or fatigue, confusion, muscle pain or cramps, fast or irregular heartbeat, joint pain Side effects that usually do not require medical attention (report to your care team if they continue or are bothersome): Diarrhea Fatigue Nausea Trouble sleeping This list may not describe all possible side effects. Call your doctor for medical advice about side effects. You may report side effects to FDA at 1-800-FDA-1088. Where should I keep my medication? This medication is given in a hospital or clinic. It will not be stored at home. NOTE: This sheet is a summary. It may not cover all possible information. If you have questions about this medicine, talk to your doctor, pharmacist, or health care provider.  2023 Elsevier/Gold Standard (2021-12-07 00:00:00)  

## 2022-06-02 ENCOUNTER — Ambulatory Visit: Payer: Medicare (Managed Care) | Admitting: Hematology and Oncology

## 2022-06-02 ENCOUNTER — Other Ambulatory Visit: Payer: Medicare (Managed Care)

## 2022-06-02 ENCOUNTER — Ambulatory Visit: Payer: Medicare (Managed Care)

## 2022-06-02 LAB — KAPPA/LAMBDA LIGHT CHAINS
Kappa free light chain: 10.7 mg/L (ref 3.3–19.4)
Kappa, lambda light chain ratio: 1.35 (ref 0.26–1.65)
Lambda free light chains: 7.9 mg/L (ref 5.7–26.3)

## 2022-06-04 ENCOUNTER — Other Ambulatory Visit: Payer: Self-pay

## 2022-06-06 LAB — MULTIPLE MYELOMA PANEL, SERUM
Albumin SerPl Elph-Mcnc: 3.3 g/dL (ref 2.9–4.4)
Albumin/Glob SerPl: 1.3 (ref 0.7–1.7)
Alpha 1: 0.3 g/dL (ref 0.0–0.4)
Alpha2 Glob SerPl Elph-Mcnc: 0.8 g/dL (ref 0.4–1.0)
B-Globulin SerPl Elph-Mcnc: 1 g/dL (ref 0.7–1.3)
Gamma Glob SerPl Elph-Mcnc: 0.6 g/dL (ref 0.4–1.8)
Globulin, Total: 2.6 g/dL (ref 2.2–3.9)
IgA: 32 mg/dL — ABNORMAL LOW (ref 61–437)
IgG (Immunoglobin G), Serum: 589 mg/dL — ABNORMAL LOW (ref 603–1613)
IgM (Immunoglobulin M), Srm: 27 mg/dL (ref 20–172)
M Protein SerPl Elph-Mcnc: 0.2 g/dL — ABNORMAL HIGH
Total Protein ELP: 5.9 g/dL — ABNORMAL LOW (ref 6.0–8.5)

## 2022-06-09 ENCOUNTER — Other Ambulatory Visit: Payer: Medicare (Managed Care)

## 2022-06-09 ENCOUNTER — Other Ambulatory Visit: Payer: Self-pay

## 2022-06-09 ENCOUNTER — Ambulatory Visit: Payer: Medicare (Managed Care)

## 2022-06-09 ENCOUNTER — Inpatient Hospital Stay: Payer: Medicare (Managed Care)

## 2022-06-09 VITALS — BP 141/78 | HR 70 | Temp 98.0°F | Resp 18

## 2022-06-09 DIAGNOSIS — C9002 Multiple myeloma in relapse: Secondary | ICD-10-CM

## 2022-06-09 DIAGNOSIS — Z5111 Encounter for antineoplastic chemotherapy: Secondary | ICD-10-CM | POA: Diagnosis not present

## 2022-06-09 LAB — CBC WITH DIFFERENTIAL (CANCER CENTER ONLY)
Abs Immature Granulocytes: 0.05 10*3/uL (ref 0.00–0.07)
Basophils Absolute: 0 10*3/uL (ref 0.0–0.1)
Basophils Relative: 0 %
Eosinophils Absolute: 0.3 10*3/uL (ref 0.0–0.5)
Eosinophils Relative: 3 %
HCT: 32.3 % — ABNORMAL LOW (ref 39.0–52.0)
Hemoglobin: 11.3 g/dL — ABNORMAL LOW (ref 13.0–17.0)
Immature Granulocytes: 1 %
Lymphocytes Relative: 11 %
Lymphs Abs: 1.1 10*3/uL (ref 0.7–4.0)
MCH: 29.7 pg (ref 26.0–34.0)
MCHC: 35 g/dL (ref 30.0–36.0)
MCV: 85 fL (ref 80.0–100.0)
Monocytes Absolute: 0.9 10*3/uL (ref 0.1–1.0)
Monocytes Relative: 9 %
Neutro Abs: 7.7 10*3/uL (ref 1.7–7.7)
Neutrophils Relative %: 76 %
Platelet Count: 211 10*3/uL (ref 150–400)
RBC: 3.8 MIL/uL — ABNORMAL LOW (ref 4.22–5.81)
RDW: 16 % — ABNORMAL HIGH (ref 11.5–15.5)
WBC Count: 10.1 10*3/uL (ref 4.0–10.5)
nRBC: 0 % (ref 0.0–0.2)

## 2022-06-09 LAB — CMP (CANCER CENTER ONLY)
ALT: 16 U/L (ref 0–44)
AST: 11 U/L — ABNORMAL LOW (ref 15–41)
Albumin: 4 g/dL (ref 3.5–5.0)
Alkaline Phosphatase: 81 U/L (ref 38–126)
Anion gap: 6 (ref 5–15)
BUN: 13 mg/dL (ref 8–23)
CO2: 28 mmol/L (ref 22–32)
Calcium: 9.9 mg/dL (ref 8.9–10.3)
Chloride: 109 mmol/L (ref 98–111)
Creatinine: 0.58 mg/dL — ABNORMAL LOW (ref 0.61–1.24)
GFR, Estimated: >60 mL/min (ref >60)
Glucose, Bld: 113 mg/dL — ABNORMAL HIGH (ref 70–99)
Potassium: 3.8 mmol/L (ref 3.5–5.1)
Sodium: 143 mmol/L (ref 135–145)
Total Bilirubin: 0.7 mg/dL (ref 0.3–1.2)
Total Protein: 6.5 g/dL (ref 6.5–8.1)

## 2022-06-09 LAB — URIC ACID: Uric Acid, Serum: 6.1 mg/dL (ref 3.7–8.6)

## 2022-06-09 LAB — LACTATE DEHYDROGENASE: LDH: 136 U/L (ref 98–192)

## 2022-06-09 MED ORDER — SODIUM CHLORIDE 0.9 % IV SOLN: Freq: Once | INTRAVENOUS | Status: AC

## 2022-06-09 MED ORDER — SODIUM CHLORIDE 0.9 % IV SOLN
40.0000 mg | Freq: Once | INTRAVENOUS | Status: AC
  Administered 2022-06-09: 40 mg via INTRAVENOUS
  Filled 2022-06-09: qty 4

## 2022-06-09 MED ORDER — DEXTROSE 5 % IV SOLN
70.0000 mg/m2 | Freq: Once | INTRAVENOUS | Status: AC
  Administered 2022-06-09: 150 mg via INTRAVENOUS
  Filled 2022-06-09: qty 60

## 2022-06-09 NOTE — Patient Instructions (Signed)
[facility]  Discharge Instructions: Thank you for choosing [facility] to provide your oncology and hematology care.   If you have a lab appointment with the [facility], please go directly to the [facility] and check in at the registration area.   Wear comfortable clothing and clothing appropriate for easy access to any Portacath or PICC line.   We strive to give you quality time with your provider. You may need to reschedule your appointment if you arrive late (15 or more minutes).  Arriving late affects you and other patients whose appointments are after yours.  Also, if you miss three or more appointments without notifying the office, you may be dismissed from the clinic at the provider's discretion.      For prescription refill requests, have your pharmacy contact our office and allow 72 hours for refills to be completed.    Today you received the following chemotherapy and/or immunotherapy agents: Kyprolis.       To help prevent nausea and vomiting after your treatment, we encourage you to take your nausea medication as directed.  BELOW ARE SYMPTOMS THAT SHOULD BE REPORTED IMMEDIATELY: *FEVER GREATER THAN 100.4 F (38 C) OR HIGHER *CHILLS OR SWEATING *NAUSEA AND VOMITING THAT IS NOT CONTROLLED WITH YOUR NAUSEA MEDICATION *UNUSUAL SHORTNESS OF BREATH *UNUSUAL BRUISING OR BLEEDING *URINARY PROBLEMS (pain or burning when urinating, or frequent urination) *BOWEL PROBLEMS (unusual diarrhea, constipation, pain near the anus) TENDERNESS IN MOUTH AND THROAT WITH OR WITHOUT PRESENCE OF ULCERS (sore throat, sores in mouth, or a toothache) UNUSUAL RASH, SWELLING OR PAIN  UNUSUAL VAGINAL DISCHARGE OR ITCHING   Items with * indicate a potential emergency and should be followed up as soon as possible or go to the Emergency Department if any problems should occur.  Please show the CHEMOTHERAPY ALERT CARD or IMMUNOTHERAPY ALERT CARD at check-in to  the Emergency Department and triage nurse.  Should you have questions after your visit or need to cancel or reschedule your appointment, please contact [facility]  Dept: [phone]  and follow the prompts.  Office hours are 8:00 a.m. to 4:30 p.m. Monday - Friday. Please note that voicemails left after 4:00 p.m. may not be returned until the following business day.  We are closed weekends and major holidays. You have access to a nurse at all times for urgent questions. Please call the main number to the clinic Dept: [phone] and follow the prompts.   For any non-urgent questions, you may also contact your provider using MyChart. We now offer e-Visits for anyone 32 and older to request care online for non-urgent symptoms. For details visit [email or web address].   Also download the MyChart app! Go to the app store, search "MyChart", open the app, select [facility], and log in with your MyChart username and password.  Masks are optional in the cancer centers. If you would like for your care team to wear a mask while they are taking care of you, please let them know. You may have one support person who is at least 68 years old accompany you for your appointments.

## 2022-06-12 LAB — KAPPA/LAMBDA LIGHT CHAINS
Kappa free light chain: 8 mg/L (ref 3.3–19.4)
Kappa, lambda light chain ratio: 1.18 (ref 0.26–1.65)
Lambda free light chains: 6.8 mg/L (ref 5.7–26.3)

## 2022-06-12 LAB — MULTIPLE MYELOMA PANEL, SERUM
Albumin SerPl Elph-Mcnc: 3.1 g/dL (ref 2.9–4.4)
Albumin/Glob SerPl: 1.3 (ref 0.7–1.7)
Alpha 1: 0.2 g/dL (ref 0.0–0.4)
Alpha2 Glob SerPl Elph-Mcnc: 0.7 g/dL (ref 0.4–1.0)
B-Globulin SerPl Elph-Mcnc: 1 g/dL (ref 0.7–1.3)
Gamma Glob SerPl Elph-Mcnc: 0.6 g/dL (ref 0.4–1.8)
Globulin, Total: 2.5 g/dL (ref 2.2–3.9)
IgA: 27 mg/dL — ABNORMAL LOW (ref 61–437)
IgG (Immunoglobin G), Serum: 586 mg/dL — ABNORMAL LOW (ref 603–1613)
IgM (Immunoglobulin M), Srm: 21 mg/dL (ref 20–172)
M Protein SerPl Elph-Mcnc: 0.2 g/dL — ABNORMAL HIGH
Total Protein ELP: 5.6 g/dL — ABNORMAL LOW (ref 6.0–8.5)

## 2022-06-16 ENCOUNTER — Inpatient Hospital Stay: Payer: Medicare (Managed Care)

## 2022-06-16 ENCOUNTER — Other Ambulatory Visit: Payer: Self-pay

## 2022-06-16 ENCOUNTER — Inpatient Hospital Stay (HOSPITAL_BASED_OUTPATIENT_CLINIC_OR_DEPARTMENT_OTHER): Payer: Medicare (Managed Care) | Admitting: Hematology and Oncology

## 2022-06-16 VITALS — BP 136/80 | HR 72 | Resp 18

## 2022-06-16 DIAGNOSIS — C9002 Multiple myeloma in relapse: Secondary | ICD-10-CM

## 2022-06-16 DIAGNOSIS — Z5111 Encounter for antineoplastic chemotherapy: Secondary | ICD-10-CM | POA: Diagnosis not present

## 2022-06-16 LAB — CMP (CANCER CENTER ONLY)
ALT: 24 U/L (ref 0–44)
AST: 13 U/L — ABNORMAL LOW (ref 15–41)
Albumin: 4 g/dL (ref 3.5–5.0)
Alkaline Phosphatase: 88 U/L (ref 38–126)
Anion gap: 6 (ref 5–15)
BUN: 13 mg/dL (ref 8–23)
CO2: 25 mmol/L (ref 22–32)
Calcium: 9.8 mg/dL (ref 8.9–10.3)
Chloride: 110 mmol/L (ref 98–111)
Creatinine: 0.74 mg/dL (ref 0.61–1.24)
GFR, Estimated: >60 mL/min (ref >60)
Glucose, Bld: 114 mg/dL — ABNORMAL HIGH (ref 70–99)
Potassium: 3.9 mmol/L (ref 3.5–5.1)
Sodium: 141 mmol/L (ref 135–145)
Total Bilirubin: 0.6 mg/dL (ref 0.3–1.2)
Total Protein: 6.3 g/dL — ABNORMAL LOW (ref 6.5–8.1)

## 2022-06-16 LAB — CBC WITH DIFFERENTIAL (CANCER CENTER ONLY)
Abs Immature Granulocytes: 0.08 10*3/uL — ABNORMAL HIGH (ref 0.00–0.07)
Basophils Absolute: 0 10*3/uL (ref 0.0–0.1)
Basophils Relative: 0 %
Eosinophils Absolute: 0.2 10*3/uL (ref 0.0–0.5)
Eosinophils Relative: 2 %
HCT: 32.8 % — ABNORMAL LOW (ref 39.0–52.0)
Hemoglobin: 11.5 g/dL — ABNORMAL LOW (ref 13.0–17.0)
Immature Granulocytes: 1 %
Lymphocytes Relative: 12 %
Lymphs Abs: 1.1 10*3/uL (ref 0.7–4.0)
MCH: 30 pg (ref 26.0–34.0)
MCHC: 35.1 g/dL (ref 30.0–36.0)
MCV: 85.6 fL (ref 80.0–100.0)
Monocytes Absolute: 0.8 10*3/uL (ref 0.1–1.0)
Monocytes Relative: 9 %
Neutro Abs: 7.1 10*3/uL (ref 1.7–7.7)
Neutrophils Relative %: 76 %
Platelet Count: 193 10*3/uL (ref 150–400)
RBC: 3.83 MIL/uL — ABNORMAL LOW (ref 4.22–5.81)
RDW: 16.3 % — ABNORMAL HIGH (ref 11.5–15.5)
WBC Count: 9.4 10*3/uL (ref 4.0–10.5)
nRBC: 0.2 % (ref 0.0–0.2)

## 2022-06-16 LAB — URIC ACID: Uric Acid, Serum: 5.2 mg/dL (ref 3.7–8.6)

## 2022-06-16 LAB — LACTATE DEHYDROGENASE: LDH: 134 U/L (ref 98–192)

## 2022-06-16 MED ORDER — HEPARIN SOD (PORK) LOCK FLUSH 100 UNIT/ML IV SOLN: 500.0000 [IU] | Freq: Once | INTRAVENOUS | Status: DC | PRN

## 2022-06-16 MED ORDER — DEXTROSE 5 % IV SOLN
70.0000 mg/m2 | Freq: Once | INTRAVENOUS | Status: AC
  Administered 2022-06-16: 150 mg via INTRAVENOUS
  Filled 2022-06-16: qty 60

## 2022-06-16 MED ORDER — SODIUM CHLORIDE 0.9 % IV SOLN: Freq: Once | INTRAVENOUS | Status: AC

## 2022-06-16 MED ORDER — SODIUM CHLORIDE 0.9% FLUSH: 10.0000 mL | INTRAVENOUS | Status: DC | PRN

## 2022-06-16 MED ORDER — SODIUM CHLORIDE 0.9 % IV SOLN
40.0000 mg | Freq: Once | INTRAVENOUS | Status: AC
  Administered 2022-06-16: 40 mg via INTRAVENOUS
  Filled 2022-06-16: qty 4

## 2022-06-16 NOTE — Patient Instructions (Signed)
[facility]  Discharge Instructions: Thank you for choosing [facility] to provide your oncology and hematology care.   If you have a lab appointment with the [facility], please go directly to the [facility] and check in at the registration area.   Wear comfortable clothing and clothing appropriate for easy access to any Portacath or PICC line.   We strive to give you quality time with your provider. You may need to reschedule your appointment if you arrive late (15 or more minutes).  Arriving late affects you and other patients whose appointments are after yours.  Also, if you miss three or more appointments without notifying the office, you may be dismissed from the clinic at the provider's discretion.      For prescription refill requests, have your pharmacy contact our office and allow 72 hours for refills to be completed.    Today you received the following chemotherapy and/or immunotherapy agents: Kyprolis.       To help prevent nausea and vomiting after your treatment, we encourage you to take your nausea medication as directed.  BELOW ARE SYMPTOMS THAT SHOULD BE REPORTED IMMEDIATELY: *FEVER GREATER THAN 100.4 F (38 C) OR HIGHER *CHILLS OR SWEATING *NAUSEA AND VOMITING THAT IS NOT CONTROLLED WITH YOUR NAUSEA MEDICATION *UNUSUAL SHORTNESS OF BREATH *UNUSUAL BRUISING OR BLEEDING *URINARY PROBLEMS (pain or burning when urinating, or frequent urination) *BOWEL PROBLEMS (unusual diarrhea, constipation, pain near the anus) TENDERNESS IN MOUTH AND THROAT WITH OR WITHOUT PRESENCE OF ULCERS (sore throat, sores in mouth, or a toothache) UNUSUAL RASH, SWELLING OR PAIN  UNUSUAL VAGINAL DISCHARGE OR ITCHING   Items with * indicate a potential emergency and should be followed up as soon as possible or go to the Emergency Department if any problems should occur.  Please show the CHEMOTHERAPY ALERT CARD or IMMUNOTHERAPY ALERT CARD at check-in to  the Emergency Department and triage nurse.  Should you have questions after your visit or need to cancel or reschedule your appointment, please contact [facility]  Dept: [phone]  and follow the prompts.  Office hours are 8:00 a.m. to 4:30 p.m. Monday - Friday. Please note that voicemails left after 4:00 p.m. may not be returned until the following business day.  We are closed weekends and major holidays. You have access to a nurse at all times for urgent questions. Please call the main number to the clinic Dept: [phone] and follow the prompts.   For any non-urgent questions, you may also contact your provider using MyChart. We now offer e-Visits for anyone 23 and older to request care online for non-urgent symptoms. For details visit [email or web address].   Also download the MyChart app! Go to the app store, search "MyChart", open the app, select [facility], and log in with your MyChart username and password.  Masks are optional in the cancer centers. If you would like for your care team to wear a mask while they are taking care of you, please let them know. You may have one support person who is at least 68 years old accompany you for your appointments.

## 2022-06-17 ENCOUNTER — Encounter: Payer: Self-pay | Admitting: Hematology and Oncology

## 2022-06-17 NOTE — Progress Notes (Signed)
[name] [phone]   [phone]  PROGRESS NOTE  Patient Care Team: Gary Adie, MD as PCP - General (Family Medicine) Gary Staggers, MD as Consulting Physician (Physical Medicine and Rehabilitation) Gary Guiles Lavon Paganini, PA-C as Physician Assistant (Physical Medicine and Rehabilitation) Gary Slick, MD as Consulting Physician (Hematology and Oncology)  Hematological/Oncological History # IgG Lambda Multiple Myeloma, Relapsed (ISS Stage II) 1) 06/2010: initial diagnosis of Multiple Myeloma after T8 compression fracture. Treated with Velcade/Revlimid/Dexamethasone and achieved a complete remission 2) Velcade was discontinued in September 2012 and that Revlimid and Decadron were discontinued in March 2013. 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 4) Followed by Dr. Alvy Bimler, last clinic visit 10/09/2019. At that time there was concern for relapse of his multiple myeloma.  5) Patient requested transfer to different provider after misunderstanding regarding imaging studies 6) 12/17/2019: transfer care to Dr. Lorenso Courier  7) 01/09/2020: Cycle 1 Day 1 of Dara/Velcade/Dex 8) 01/21/2020: presented as urgent visit for diarrhea and dehydration. Holding chemotherapy scheduled for 01/23/2020. 9) 01/30/2020: Resume dara/velcade/dex after resolution of diarrhea.  10) 02/13/2020: restaging labs show M protein 0.8, Kappa 4.5, lamba 17.2, ratio 0.26, urine M protein 53 (7.1%). All MM labs indicate improvement.  11) 03/10/2020: Cycle 4 Day 1 of Dara/Velcade/Dex. Transition to q 3 week daratumumab.  12) 06/04/2020:  Cycle 8 Day 1 of Dara/Velcade/Dex 13) 06/25/2020:  Cycle 9 Day 1 of Dara/Velcade/Dex 14) 07/22/2020: Cycle 10 Day 1 of  Dara//Dex 15) 08/18/2020: Cycle 11 Day 1 of Dara//Dex 16) 09/23/2020: Cycle 12 Day 1 of Dara//Dex 17) 10/22/2020: Cycle 13 Day 1 of Dara/Dex   18) 11/19/2020: Cycle 14 Day 1 of Dara/Dex  19) 12/17/2020: Cycle 15 Day 1 of Dara/Dex  20) 01/17/2021: Cycle 16 Day 1 of Dara/Dex  21) 02/11/2021: Cycle 17 Day 1 of Dara/Dex 22) 03/10/2021: Cycle 18 Day 1 of Dara/Dex  23) 04/08/2021: Cycle 19 Day 1 of Dara/Dex  24) 08/03/2021: Cycle 20 Day 1 of Dara/Dex (delayed due to scheduling error) 25) 09/02/2021: Cycle 21 Day 1 of Dara/Dex 26) 09/30/2021: Cycle 22 Day 1 of Dara/Dex 27) 10/28/2021: Cycle 23 Day 1 of Dara/Dex 28) 12/02/2021: Cycle 24 Day 1 of Dara/Dex 29) 12/30/2021: Cycle 25 Day 1 of Dara/Dex 30) 01/06/2022: Cycle 1 Day 1 of Kyprolis/Dex 31) 02/03/2022: Cycle 2 Day 1 of Kyprolis/Dex 32) 02/24/2022: Cycle 3 Day 1 of Kyprolis/Dex 33) 04/07/2022: Cycle 4 Day 1 of Kyprolis/Dex 34) 05/05/2022: Cycle 5 Day 1 of Kyprolis/Dex 35) 06/01/2022: Cycle 6 Day 1 of Kyprolis/Dex  Interval History:  [name] 66 y.o. male with medical history significant for  IgG Lambda Multiple Myeloma who presents for a follow up visit. The patient's last visit was on 06/01/2022.  He presents today to for cycle 6 day 15 of Kyprolis/Dex.   On exam today [name] reports he had an excellent Thanksgiving.  Overall he is feeling quite well but is concerned about the fact he continues gaining weight.  His weight is currently up to 260 pounds.  His appetite is quite strong and his energy levels are good.  He is doing his best to try to continue with physical activity including rigorous arm exercises.  He notes otherwise he has noticed no side effects as result of his treatment.  He is not having any numbness or tingling of the fingers or toes.  He denies any fevers, chills,  sweats, nausea, vomiting or diarrhea. A full 10 point ROS is listed below.  He is willing and able to proceed with treatment at this time.  MEDICAL HISTORY:  Past Medical History:  Diagnosis Date   Adrenal insufficiency ([place])    on chronic dexamethasone   Anemia    Cancer ([place])    Coagulopathy ([place])     on xeralto/ s/p DVT while on coumadin,  IVC in place   Diabetes mellitus without complication ([place])    type 2   Gross hematuria 7/14   post foley cath procedure   History of blood transfusion 7/14   Multiple myeloma    thoracic T8 with paraplegia s/p resection- on chemo at visit 10/13/10   Multiple myeloma    Multiple myeloma without mention of remission    Neurogenic bladder    Neurogenic bowel    Paraplegia ([place])    Partial small bowel obstruction ([place]) during dec 2011 admission    SURGICAL HISTORY: Past Surgical History:  Procedure Laterality Date   COLONOSCOPY WITH PROPOFOL N/A 04/12/2017   Procedure: COLONOSCOPY WITH PROPOFOL;  Surgeon: Gary Shipper, MD;  Location: [facility];  Service: Endoscopy;  Laterality: N/A;   COLONOSCOPY WITH PROPOFOL N/A 04/19/2017   Procedure: COLONOSCOPY WITH PROPOFOL;  Surgeon: Gary Flock, MD;  Location: [facility];  Service: Gastroenterology;  Laterality: N/A;   COLOSTOMY  07/20/2011   Procedure: COLOSTOMY;  Surgeon: Gary Keens, DO;  Location: [place];  Service: General;;   COLOSTOMY REVISION  07/20/2011   Procedure: COLON RESECTION SIGMOID;  Surgeon: Gary Keens, DO;  Location: [place];  Service: General;;   CYSTOSCOPY N/A 04/04/2013   Procedure: CYSTOSCOPY WITH LITHALOPAXY;  Surgeon: Gary Frock, MD;  Location: [facility];  Service: Urology;  Laterality: N/A;   INSERTION OF SUPRAPUBIC CATHETER N/A 04/04/2013   Procedure: INSERTION OF SUPRAPUBIC CATHETER;  Surgeon: Gary Frock, MD;  Location: [facility];  Service: Urology;  Laterality: N/A;   LAPAROTOMY  07/20/2011   Procedure: EXPLORATORY LAPAROTOMY;  Surgeon: Gary Keens, DO;  Location: [place];  Service: General;  Laterality: N/A;   myeloma thoracic T8 with parpaplegia s/p thoracotomy and thoracic T7-9 cage placement on Dec 26th 2011  07/18/10    SOCIAL HISTORY: Social History   Socioeconomic History   Marital status: Married    Spouse name: Not on file   Number  of children: Not on file   Years of education: Not on file   Highest education level: Not on file  Occupational History   Not on file  Tobacco Use   Smoking status: Never   Smokeless tobacco: Never  Vaping Use   Vaping Use: Never used  Substance and Sexual Activity   Alcohol use: No   Drug use: No   Sexual activity: Never  Other Topics Concern   Not on file  Social History Narrative   Not on file   Social Determinants of Health   Financial Resource Strain: Not on file  Food Insecurity: Not on file  Transportation Needs: Not on file  Physical Activity: Not on file  Stress: Not on file  Social Connections: Not on file  Intimate Partner Violence: Not on file    FAMILY HISTORY: Family History  Problem Relation Age of Onset   Ovarian cancer Mother    Diabetes Father     ALLERGIES:  is allergic to feraheme [ferumoxytol].  MEDICATIONS:  Current Outpatient Medications  Medication Sig Dispense Refill   acyclovir (ZOVIRAX) 400 MG  tablet Take 1 tablet (400 mg total) by mouth 2 (two) times daily. 60 tablet 2   atorvastatin (LIPITOR) 10 MG tablet Take 10 mg by mouth daily.     baclofen (LIORESAL) 20 MG tablet Take 20 mg by mouth 2 (two) times daily.     diazepam (DIASTAT ACUDIAL) 10 MG GEL SMARTSIG:By Mouth     dorzolamide (TRUSOPT) 2 % ophthalmic solution 1 drop 2 (two) times daily.     iron polysaccharides (NU-IRON) 150 MG capsule Take 1 capsule (150 mg total) by mouth daily. 30 capsule 0   latanoprost (XALATAN) 0.005 % ophthalmic solution Place 1 drop into both eyes at bedtime.     ondansetron (ZOFRAN) 8 MG tablet Take 1 tablet (8 mg total) by mouth every 8 (eight) hours as needed for nausea or vomiting. 30 tablet 0   rivaroxaban (XARELTO) 10 MG TABS tablet Take 1 tablet (10 mg total) by mouth daily with supper. 30 tablet 9   Skin Protectants, Misc. (EUCERIN) cream Apply 1 application topically 2 (two) times daily as needed for dry skin.      zinc oxide (BALMEX) 11.3 % CREA  cream Apply 1 application topically 2 (two) times daily.     No current facility-administered medications for this visit.   Facility-Administered Medications Ordered in Other Visits  Medication Dose Route Frequency Provider Last Rate Last Admin   sodium chloride flush (NS) 0.9 % injection 10 mL  10 mL Intravenous PRN Alvy Bimler, Ni, MD        REVIEW OF SYSTEMS:   Constitutional: ( - ) fevers, ( - )  chills , ( - ) night sweats Eyes: ( - ) blurriness of vision, ( - ) double vision, ( - ) watery eyes Ears, nose, mouth, throat, and face: ( - ) mucositis, ( - ) sore throat Respiratory: ( - ) cough, ( - ) dyspnea, ( - ) wheezes Cardiovascular: ( - ) palpitation, ( - ) chest discomfort, ( - ) lower extremity swelling Gastrointestinal:  ( - ) nausea, ( - ) heartburn, ( - ) change in bowel habits Skin: ( - ) abnormal skin rashes Lymphatics: ( - ) new lymphadenopathy, ( - ) easy bruising Neurological: ( - ) numbness, ( - ) tingling, ( - ) new weaknesses Behavioral/Psych: ( - ) mood change, ( - ) new changes  All other systems were reviewed with the patient and are negative.  PHYSICAL EXAMINATION: ECOG PERFORMANCE STATUS: paraplegic.   Vitals:   06/16/22 1134  BP: (!) 149/81  Pulse: 77  Resp: 15  Temp: (!) 97.3 F (36.3 C)  SpO2: 99%       Filed Weights   06/16/22 1134  Weight: 260 lb 4.8 oz (118.1 kg)     GENERAL: well appearing middle aged [place] American male alert, no distress and comfortable SKIN: skin color, texture, turgor are normal, no rashes or significant lesions EYES: conjunctiva are pink and non-injected, sclera clear LUNGS: clear to auscultation and percussion with normal breathing effort HEART: regular rate & rhythm and no murmurs and no lower extremity edema Musculoskeletal: no cyanosis of digits and no clubbing  PSYCH: alert & oriented x 3, fluent speech NEURO: paraplegic, no use of LE bilaterally.   LABORATORY DATA:  I have reviewed the data as listed     Latest Ref Rng & Units 06/16/2022   11:05 AM 06/09/2022   10:30 AM 06/01/2022    9:17 AM  CBC  WBC 4.0 - 10.5 K/uL 9.4  10.1  7.7   Hemoglobin 13.0 - 17.0 g/dL 11.5  11.3  11.5   Hematocrit 39.0 - 52.0 % 32.8  32.3  33.1   Platelets 150 - 400 K/uL 193  211  445        Latest Ref Rng & Units 06/16/2022   11:05 AM 06/09/2022   10:30 AM 06/01/2022    9:17 AM  CMP  Glucose 70 - 99 mg/dL 114  113  148   BUN 8 - 23 mg/dL _0 Creatinine 0.61 - 1.24 mg/dL 0.74  0.58  0.72   Sodium 135 - 145 mmol/L 141  143  144   Potassium 3.5 - 5.1 mmol/L 3.9  3.8  3.9   Chloride 98 - 111 mmol/L 110  109  114   CO2 22 - 32 mmol/L _1 Calcium 8.9 - 10.3 mg/dL 9.8  9.9  9.0   Total Protein 6.5 - 8.1 g/dL 6.3  6.5  6.5   Total Bilirubin 0.3 - 1.2 mg/dL 0.6  0.7  0.6   Alkaline Phos 38 - 126 U/L 88  81  85   AST 15 - 41 U/L _2 ALT 0 - 44 U/L _3 Lab Results  Component Value Date   MPROTEIN 0.2 (H) 06/09/2022   MPROTEIN 0.2 (H) 06/01/2022   MPROTEIN 0.2 (H) 05/12/2022   Lab Results  Component Value Date   KPAFRELGTCHN 8.0 06/09/2022   KPAFRELGTCHN 10.7 06/01/2022   KPAFRELGTCHN 5.1 05/12/2022   LAMBDASER 6.8 06/09/2022   LAMBDASER 7.9 06/01/2022   LAMBDASER 4.1 (L) 05/12/2022   KAPLAMBRATIO 1.18 06/09/2022   KAPLAMBRATIO 1.35 06/01/2022   KAPLAMBRATIO 1.24 05/12/2022    RADIOGRAPHIC STUDIES: No results found.  ASSESSMENT & PLAN Gary SCHEAR 66 y.o. male with medical history significant for  IgG Lambda Multiple Myeloma who presents for a follow up visit.  After review of the labs, discussion with the patient, and reviewed the imaging his findings are most consistent with a relapsed multiple myeloma.The patient has had excellent success before with treatment of his myeloma with Velcade, Revlimid, and dexamethasone.  Transition to daratumumab therapy and was on a once monthly treatments but unfortunately developed progression of disease.  We have  transitioned to Kyprolis/Dex.  # IgG Lambda Multiple Myeloma, Relapsed (ISS Stage II) --findings are most consistent with relapsed multiple myeloma. Patient previously successfully treated with Velcade/Rev/Dex and Daratumumab/Velcade/Dex. On 07/02/2020 he transitioned to monthly daratumumab alone.  --due to to rise in M protein, switched to Kyprolis and Dexamethasone on 01/06/2022 Plan: --labs today were reviewed and adequate for treatment.  --Labs show white blood cell count 9.4, hemoglobin 11.5, MCV 85.6, and platelets of 193.  Myeloma labs from 05/05/2022 showed M protein dropped to 0.2 with normalization of kappa lambda ratio to 1.24 --patient will proceed with Cycle 6 Day 15 Kyprolis today.  --return for weekly Kyprolis and clinic visit  in 2 weeks.   #History of DVT --He had placement of IVC filter, remains on Xarelto. --Due to poor mobility, and lack of bleeding complications, I recommend he remain on Xarelto indefinitely. --caution if Plt count were to drop <50  #Snoring/Risk for OSA: --Made referral to sleep center to test for OSA.   # Supportive Care -- provided patient with an albuterol inhaler (for use with daratumumab) --acyclovir 487m BID for VZV prophylaxis --zofran [id number]  PRN for nausea/vomiting  --Zometa is being held in the setting of his prior episode of osteonecrosis of the jaw  No orders of the defined types were placed in this encounter.   All questions were answered. The patient knows to call the clinic with any problems, questions or concerns.  I have spent a total of 30 minutes minutes of face-to-face and non-face-to-face time, preparing to see the patient,  performing a medically appropriate examination, counseling and educating the patient, communicating with other health care professionals, documenting clinical information in the electronic health record,  and care coordination.   Ledell Peoples, MD Department of Hematology/Oncology [place] [facility] Phone: [phone] Pager: [phone] Email: [email or web address]   06/17/2022 2:04 PM   Literature Support:  Darrin Nipper, Petrucci [place], [place], [place], [place], [place], Spada S, [place], Ponticelli E, [place], Cavo M, Di Toritto TC, Shela Nevin F, [place] V, Palumbo A, Boccadoro M, Larocca A. Once-weekly versus twice-weekly carfilzomib in patients with newly diagnosed multiple myeloma: a pooled analysis of two phase I/II studies. Haematologica. 2019 Aug;104(8):1640-1647.  --Once-weekly 70 mg/m2 carfilzomib as induction and maintenance therapy for newly diagnosed multiple myeloma patients was as safe and effective as twice-weekly 36 mg/m2 carfilzomib and provided a more convenient schedule.

## 2022-06-24 ENCOUNTER — Other Ambulatory Visit: Payer: Self-pay

## 2022-06-30 ENCOUNTER — Inpatient Hospital Stay: Payer: Medicare (Managed Care)

## 2022-06-30 ENCOUNTER — Other Ambulatory Visit: Payer: Self-pay

## 2022-06-30 ENCOUNTER — Inpatient Hospital Stay (HOSPITAL_BASED_OUTPATIENT_CLINIC_OR_DEPARTMENT_OTHER): Payer: Medicare (Managed Care) | Admitting: Physician Assistant

## 2022-06-30 ENCOUNTER — Inpatient Hospital Stay: Payer: Medicare (Managed Care) | Attending: Physician Assistant

## 2022-06-30 DIAGNOSIS — Z5111 Encounter for antineoplastic chemotherapy: Secondary | ICD-10-CM | POA: Insufficient documentation

## 2022-06-30 DIAGNOSIS — Z86718 Personal history of other venous thrombosis and embolism: Secondary | ICD-10-CM | POA: Insufficient documentation

## 2022-06-30 DIAGNOSIS — Z7901 Long term (current) use of anticoagulants: Secondary | ICD-10-CM | POA: Diagnosis not present

## 2022-06-30 DIAGNOSIS — E119 Type 2 diabetes mellitus without complications: Secondary | ICD-10-CM | POA: Insufficient documentation

## 2022-06-30 DIAGNOSIS — C9002 Multiple myeloma in relapse: Secondary | ICD-10-CM | POA: Diagnosis not present

## 2022-06-30 LAB — CBC WITH DIFFERENTIAL (CANCER CENTER ONLY)
Abs Immature Granulocytes: 0.04 10*3/uL (ref 0.00–0.07)
Basophils Absolute: 0.1 10*3/uL (ref 0.0–0.1)
Basophils Relative: 1 %
Eosinophils Absolute: 0.3 10*3/uL (ref 0.0–0.5)
Eosinophils Relative: 4 %
HCT: 33.1 % — ABNORMAL LOW (ref 39.0–52.0)
Hemoglobin: 11.5 g/dL — ABNORMAL LOW (ref 13.0–17.0)
Immature Granulocytes: 1 %
Lymphocytes Relative: 12 %
Lymphs Abs: 1 10*3/uL (ref 0.7–4.0)
MCH: 29.4 pg (ref 26.0–34.0)
MCHC: 34.7 g/dL (ref 30.0–36.0)
MCV: 84.7 fL (ref 80.0–100.0)
Monocytes Absolute: 0.8 10*3/uL (ref 0.1–1.0)
Monocytes Relative: 9 %
Neutro Abs: 6.5 10*3/uL (ref 1.7–7.7)
Neutrophils Relative %: 73 %
Platelet Count: 467 10*3/uL — ABNORMAL HIGH (ref 150–400)
RBC: 3.91 MIL/uL — ABNORMAL LOW (ref 4.22–5.81)
RDW: 16.3 % — ABNORMAL HIGH (ref 11.5–15.5)
WBC Count: 8.7 10*3/uL (ref 4.0–10.5)
nRBC: 0 % (ref 0.0–0.2)

## 2022-06-30 LAB — CMP (CANCER CENTER ONLY)
ALT: 18 U/L (ref 0–44)
AST: 13 U/L — ABNORMAL LOW (ref 15–41)
Albumin: 3.9 g/dL (ref 3.5–5.0)
Alkaline Phosphatase: 87 U/L (ref 38–126)
Anion gap: 6 (ref 5–15)
BUN: 12 mg/dL (ref 8–23)
CO2: 26 mmol/L (ref 22–32)
Calcium: 9.5 mg/dL (ref 8.9–10.3)
Chloride: 110 mmol/L (ref 98–111)
Creatinine: 0.69 mg/dL (ref 0.61–1.24)
GFR, Estimated: >60 mL/min (ref >60)
Glucose, Bld: 158 mg/dL — ABNORMAL HIGH (ref 70–99)
Potassium: 4 mmol/L (ref 3.5–5.1)
Sodium: 142 mmol/L (ref 135–145)
Total Bilirubin: 0.6 mg/dL (ref 0.3–1.2)
Total Protein: 6.4 g/dL — ABNORMAL LOW (ref 6.5–8.1)

## 2022-06-30 LAB — URIC ACID: Uric Acid, Serum: 5.9 mg/dL (ref 3.7–8.6)

## 2022-06-30 LAB — LACTATE DEHYDROGENASE: LDH: 139 U/L (ref 98–192)

## 2022-06-30 MED ORDER — DEXTROSE 5 % IV SOLN
70.0000 mg/m2 | Freq: Once | INTRAVENOUS | Status: AC
  Administered 2022-06-30: 150 mg via INTRAVENOUS
  Filled 2022-06-30: qty 60

## 2022-06-30 MED ORDER — SODIUM CHLORIDE 0.9 % IV SOLN: Freq: Once | INTRAVENOUS | Status: AC

## 2022-06-30 MED ORDER — SODIUM CHLORIDE 0.9 % IV SOLN
40.0000 mg | Freq: Once | INTRAVENOUS | Status: AC
  Administered 2022-06-30: 40 mg via INTRAVENOUS
  Filled 2022-06-30: qty 4

## 2022-06-30 NOTE — Progress Notes (Signed)
[facility] Telephone:([phone]) [phone]   [phone]  PROGRESS NOTE  Patient Care Team: [name], MD as PCP - General (Family Medicine) [name], MD as Consulting Physician (Physical Medicine and Rehabilitation) Donia Guiles Lavon Paganini, PA-C as Physician Assistant (Physical Medicine and Rehabilitation) Orson Slick, MD as Consulting Physician (Hematology and Oncology)  Hematological/Oncological History # IgG Lambda Multiple Myeloma, Relapsed (ISS Stage II) 1) 06/2010: initial diagnosis of Multiple Myeloma after T8 compression fracture. Treated with Velcade/Revlimid/Dexamethasone and achieved a complete remission 2) Velcade was discontinued in September 2012 and that Revlimid and Decadron were discontinued in March 2013. 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 4) Followed by Dr. Alvy Bimler, last clinic visit 10/09/2019. At that time there was concern for relapse of his multiple myeloma.  5) Patient requested transfer to different provider after misunderstanding regarding imaging studies 6) 12/17/2019: transfer care to Dr. Lorenso Courier  7) 01/09/2020: Cycle 1 Day 1 of Dara/Velcade/Dex 8) 01/21/2020: presented as urgent visit for diarrhea and dehydration. Holding chemotherapy scheduled for 01/23/2020. 9) 01/30/2020: Resume dara/velcade/dex after resolution of diarrhea.  10) 02/13/2020: restaging labs show M protein 0.8, Kappa 4.5, lamba 17.2, ratio 0.26, urine M protein 53 (7.1%). All MM labs indicate improvement.  11) 03/10/2020: Cycle 4 Day 1 of Dara/Velcade/Dex. Transition to q 3 week daratumumab.  12) 06/04/2020:  Cycle 8 Day 1 of Dara/Velcade/Dex 13) 06/25/2020:  Cycle 9 Day 1 of Dara/Velcade/Dex 14) 07/22/2020: Cycle 10 Day 1 of  Dara//Dex 15) 08/18/2020: Cycle 11 Day 1 of Dara//Dex 16) 09/23/2020: Cycle 12 Day 1 of Dara//Dex 17) 10/22/2020: Cycle 13 Day 1 of Dara/Dex   18) 11/19/2020: Cycle 14 Day 1 of Dara/Dex  19) 12/17/2020: Cycle 15 Day 1 of Dara/Dex  20) 01/17/2021: Cycle 16 Day 1 of Dara/Dex  21) 02/11/2021: Cycle 17 Day 1 of Dara/Dex 22) 03/10/2021: Cycle 18 Day 1 of Dara/Dex  23) 04/08/2021: Cycle 19 Day 1 of Dara/Dex  24) 08/03/2021: Cycle 20 Day 1 of Dara/Dex (delayed due to scheduling error) 25) 09/02/2021: Cycle 21 Day 1 of Dara/Dex 26) 09/30/2021: Cycle 22 Day 1 of Dara/Dex 27) 10/28/2021: Cycle 23 Day 1 of Dara/Dex 28) 12/02/2021: Cycle 24 Day 1 of Dara/Dex 29) 12/30/2021: Cycle 25 Day 1 of Dara/Dex 30) 01/06/2022: Cycle 1 Day 1 of Kyprolis/Dex 31) 02/03/2022: Cycle 2 Day 1 of Kyprolis/Dex 32) 02/24/2022: Cycle 3 Day 1 of Kyprolis/Dex 33) 04/07/2022: Cycle 4 Day 1 of Kyprolis/Dex 34) 05/05/2022: Cycle 5 Day 1 of Kyprolis/Dex 35) 06/01/2022: Cycle 6 Day 1 of Kyprolis/Dex 36) 06/30/2022: Cycle 7 Day 1 of Kyprolis/Dex  Interval History:  [name] 66 y.o. male with medical history significant for  IgG Lambda Multiple Myeloma who presents for a follow up visit. The patient's last visit was on 06/16/2022.  He presents today to for cycle 7 day 1 of Kyprolis/Dex.   On exam today Mr. Schepp reports he is doing well without any significant limitations. His appetite has increased due to steroids. His weight is stable at 260 lbs. His energy levels are stable and denies any changes in his ADLs. He denies nausea, vomiting or abdominal pain. Output from his ostomy is stable. No evidence of easy bruising or signs of active bleeding.  He denies any fevers, chills, sweats, nausea, vomiting or diarrhea. A full 10 point ROS is listed below.  He is willing and able to proceed with treatment at  this time.  MEDICAL HISTORY:  Past Medical History:  Diagnosis Date   Adrenal insufficiency ([place])    on chronic dexamethasone   Anemia    Cancer ([place])    Coagulopathy ([place])    on xeralto/ s/p DVT while on coumadin,  IVC in place   Diabetes mellitus without complication  ([place])    type 2   Gross hematuria 7/14   post foley cath procedure   History of blood transfusion 7/14   Multiple myeloma    thoracic T8 with paraplegia s/p resection- on chemo at visit 10/13/10   Multiple myeloma    Multiple myeloma without mention of remission    Neurogenic bladder    Neurogenic bowel    Paraplegia ([place])    Partial small bowel obstruction ([place]) during dec 2011 admission    SURGICAL HISTORY: Past Surgical History:  Procedure Laterality Date   COLONOSCOPY WITH PROPOFOL N/A 04/12/2017   Procedure: COLONOSCOPY WITH PROPOFOL;  Surgeon: Jomarie Gellis Shipper, MD;  Location: [facility];  Service: Endoscopy;  Laterality: N/A;   COLONOSCOPY WITH PROPOFOL N/A 04/19/2017   Procedure: COLONOSCOPY WITH PROPOFOL;  Surgeon: Yetta Flock, MD;  Location: [facility];  Service: Gastroenterology;  Laterality: N/A;   COLOSTOMY  07/20/2011   Procedure: COLOSTOMY;  Surgeon: Judieth Keens, DO;  Location: [facility];  Service: General;;   COLOSTOMY REVISION  07/20/2011   Procedure: COLON RESECTION SIGMOID;  Surgeon: Judieth Keens, DO;  Location: [facility];  Service: General;;   CYSTOSCOPY N/A 04/04/2013   Procedure: CYSTOSCOPY WITH LITHALOPAXY;  Surgeon: Alexis Frock, MD;  Location: [facility];  Service: Urology;  Laterality: N/A;   INSERTION OF SUPRAPUBIC CATHETER N/A 04/04/2013   Procedure: INSERTION OF SUPRAPUBIC CATHETER;  Surgeon: Alexis Frock, MD;  Location: [facility];  Service: Urology;  Laterality: N/A;   LAPAROTOMY  07/20/2011   Procedure: EXPLORATORY LAPAROTOMY;  Surgeon: Judieth Keens, DO;  Location: [facility];  Service: General;  Laterality: N/A;   myeloma thoracic T8 with parpaplegia s/p thoracotomy and thoracic T7-9 cage placement on Dec 26th 2011  07/18/10    SOCIAL HISTORY: Social History   Socioeconomic History   Marital status: Married    Spouse name: Not on file   Number of children: Not on file   Years of education: Not on file   Highest education level: Not on  file  Occupational History   Not on file  Tobacco Use   Smoking status: Never   Smokeless tobacco: Never  Vaping Use   Vaping Use: Never used  Substance and Sexual Activity   Alcohol use: No   Drug use: No   Sexual activity: Never  Other Topics Concern   Not on file  Social History Narrative   Not on file   Social Determinants of Health   Financial Resource Strain: Not on file  Food Insecurity: Not on file  Transportation Needs: Not on file  Physical Activity: Not on file  Stress: Not on file  Social Connections: Not on file  Intimate Partner Violence: Not on file    FAMILY HISTORY: Family History  Problem Relation Age of Onset   Ovarian cancer Mother    Diabetes Father     ALLERGIES:  is allergic to feraheme [ferumoxytol].  MEDICATIONS:  Current Outpatient Medications  Medication Sig Dispense Refill   acyclovir (ZOVIRAX) 400 MG tablet Take 1 tablet (400 mg total) by mouth 2 (two) times daily. 60 tablet 2   atorvastatin (LIPITOR) 10 MG tablet Take  10 mg by mouth daily.     baclofen (LIORESAL) 20 MG tablet Take 20 mg by mouth 2 (two) times daily.     diazepam (DIASTAT ACUDIAL) 10 MG GEL SMARTSIG:By Mouth     dorzolamide (TRUSOPT) 2 % ophthalmic solution 1 drop 2 (two) times daily.     iron polysaccharides (NU-IRON) 150 MG capsule Take 1 capsule (150 mg total) by mouth daily. 30 capsule 0   latanoprost (XALATAN) 0.005 % ophthalmic solution Place 1 drop into both eyes at bedtime.     ondansetron (ZOFRAN) 8 MG tablet Take 1 tablet (8 mg total) by mouth every 8 (eight) hours as needed for nausea or vomiting. 30 tablet 0   rivaroxaban (XARELTO) 10 MG TABS tablet Take 1 tablet (10 mg total) by mouth daily with supper. 30 tablet 9   Skin Protectants, Misc. (EUCERIN) cream Apply 1 application topically 2 (two) times daily as needed for dry skin.      zinc oxide (BALMEX) 11.3 % CREA cream Apply 1 application topically 2 (two) times daily.     No current facility-administered  medications for this visit.   Facility-Administered Medications Ordered in Other Visits  Medication Dose Route Frequency Provider Last Rate Last Admin   carfilzomib (KYPROLIS) 150 mg in dextrose 5 % 100 mL chemo infusion  70 mg/m2 (Capped) Intravenous Once Ledell Peoples IV, MD       sodium chloride flush (NS) 0.9 % injection 10 mL  10 mL Intravenous PRN Alvy Bimler, Ni, MD        REVIEW OF SYSTEMS:   Constitutional: ( - ) fevers, ( - )  chills , ( - ) night sweats Eyes: ( - ) blurriness of vision, ( - ) double vision, ( - ) watery eyes Ears, nose, mouth, throat, and face: ( - ) mucositis, ( - ) sore throat Respiratory: ( - ) cough, ( - ) dyspnea, ( - ) wheezes Cardiovascular: ( - ) palpitation, ( - ) chest discomfort, ( - ) lower extremity swelling Gastrointestinal:  ( - ) nausea, ( - ) heartburn, ( - ) change in bowel habits Skin: ( - ) abnormal skin rashes Lymphatics: ( - ) new lymphadenopathy, ( - ) easy bruising Neurological: ( - ) numbness, ( - ) tingling, ( - ) new weaknesses Behavioral/Psych: ( - ) mood change, ( - ) new changes  All other systems were reviewed with the patient and are negative.  PHYSICAL EXAMINATION: ECOG PERFORMANCE STATUS: paraplegic.   Vitals:   06/30/22 1104  BP: 135/81  Pulse: 71  Resp: 18  Temp: 98.2 F (36.8 C)  SpO2: 100%       Filed Weights   06/30/22 1104  Weight: 260 lb 5 oz (118.1 kg)     GENERAL: well appearing middle aged [place] American male alert, no distress and comfortable SKIN: skin color, texture, turgor are normal, no rashes or significant lesions EYES: conjunctiva are pink and non-injected, sclera clear LUNGS: clear to auscultation and percussion with normal breathing effort HEART: regular rate & rhythm and no murmurs and no lower extremity edema Musculoskeletal: no cyanosis of digits and no clubbing  PSYCH: alert & oriented x 3, fluent speech NEURO: paraplegic, no use of LE bilaterally.   LABORATORY DATA:  I have  reviewed the data as listed    Latest Ref Rng & Units 06/30/2022   10:38 AM 06/16/2022   11:05 AM 06/09/2022   10:30 AM  CBC  WBC 4.0 - 10.5 K/uL  8.7  9.4  10.1   Hemoglobin 13.0 - 17.0 g/dL 11.5  11.5  11.3   Hematocrit 39.0 - 52.0 % 33.1  32.8  32.3   Platelets 150 - 400 K/uL 467  193  211        Latest Ref Rng & Units 06/30/2022   10:38 AM 06/16/2022   11:05 AM 06/09/2022   10:30 AM  CMP  Glucose 70 - 99 mg/dL 158  114  113   BUN 8 - 23 mg/dL _0 Creatinine 0.61 - 1.24 mg/dL 0.69  0.74  0.58   Sodium 135 - 145 mmol/L 142  141  143   Potassium 3.5 - 5.1 mmol/L 4.0  3.9  3.8   Chloride 98 - 111 mmol/L 110  110  109   CO2 22 - 32 mmol/L _1 Calcium 8.9 - 10.3 mg/dL 9.5  9.8  9.9   Total Protein 6.5 - 8.1 g/dL 6.4  6.3  6.5   Total Bilirubin 0.3 - 1.2 mg/dL 0.6  0.6  0.7   Alkaline Phos 38 - 126 U/L 87  88  81   AST 15 - 41 U/L _2 ALT 0 - 44 U/L _3 Lab Results  Component Value Date   MPROTEIN 0.2 (H) 06/09/2022   MPROTEIN 0.2 (H) 06/01/2022   MPROTEIN 0.2 (H) 05/12/2022   Lab Results  Component Value Date   KPAFRELGTCHN 8.0 06/09/2022   KPAFRELGTCHN 10.7 06/01/2022   KPAFRELGTCHN 5.1 05/12/2022   LAMBDASER 6.8 06/09/2022   LAMBDASER 7.9 06/01/2022   LAMBDASER 4.1 (L) 05/12/2022   KAPLAMBRATIO 1.18 06/09/2022   KAPLAMBRATIO 1.35 06/01/2022   KAPLAMBRATIO 1.24 05/12/2022    RADIOGRAPHIC STUDIES: No results found.  ASSESSMENT & PLAN [name] 66 y.o. male with medical history significant for  IgG Lambda Multiple Myeloma who presents for a follow up visit.  After review of the labs, discussion with the patient, and reviewed the imaging his findings are most consistent with a relapsed multiple myeloma.The patient has had excellent success before with treatment of his myeloma with Velcade, Revlimid, and dexamethasone.  Transition to daratumumab therapy and was on a once monthly treatments but unfortunately developed  progression of disease.  We have transitioned to Kyprolis/Dex.  # IgG Lambda Multiple Myeloma, Relapsed (ISS Stage II) --findings are most consistent with relapsed multiple myeloma. Patient previously successfully treated with Velcade/Rev/Dex and Daratumumab/Velcade/Dex. On 07/02/2020 he transitioned to monthly daratumumab alone.  --due to to rise in M protein, switched to Kyprolis and Dexamethasone on 01/06/2022 Plan: --labs today were reviewed and adequate for treatment.  --Labs show white blood cell count 8.7, hemoglobin 11.5, MCV 84.7, and platelets of 467. Myeloma labs from 06/09/2022 showed M protein dropped to 0.2 with normalization of kappa lambda ratio to 1.18 --patient will proceed with Cycle 7 Day 1 Kyprolis today.  --return for weekly Kyprolis and clinic visit  in 2 weeks.   #History of DVT --He had placement of IVC filter, remains on Xarelto. --Due to poor mobility, and lack of bleeding complications, I recommend he remain on Xarelto indefinitely. --caution if Plt count were to drop <50  #Snoring/Risk for OSA: --Made referral to sleep center to test for OSA.   # Supportive Care -- provided patient with an albuterol inhaler (for use with daratumumab) --acyclovir 485m BID for VZV prophylaxis --zofran [id number]  PRN for nausea/vomiting  --Zometa is being held in the setting of his prior episode of osteonecrosis of the jaw  No orders of the defined types were placed in this encounter.   All questions were answered. The patient knows to call the clinic with any problems, questions or concerns.  I have spent a total of 30 minutes minutes of face-to-face and non-face-to-face time, preparing to see the patient,  performing a medically appropriate examination, counseling and educating the patient, communicating with other health care professionals, documenting clinical information in the electronic health record,  and care coordination.   Dede Query PA-C Dept of Hematology and  [facility] Phone: [phone]    06/30/2022 12:48 PM   Literature Support:  Darrin Nipper, Petrucci MT, [place], [place], [place], [place], Spada S, [place], Ponticelli E, [place], Cavo M, Di Toritto TC, Shela Nevin F, Monte[place] V, Palumbo A, Boccadoro M, Larocca A. Once-weekly versus twice-weekly carfilzomib in patients with newly diagnosed multiple myeloma: a pooled analysis of two phase I/II studies. Haematologica. 2019 Aug;104(8):1640-1647.  --Once-weekly 70 mg/m2 carfilzomib as induction and maintenance therapy for newly diagnosed multiple myeloma patients was as safe and effective as twice-weekly 36 mg/m2 carfilzomib and provided a more convenient schedule.

## 2022-07-03 LAB — KAPPA/LAMBDA LIGHT CHAINS
Kappa free light chain: 11.8 mg/L (ref 3.3–19.4)
Kappa, lambda light chain ratio: 1.19 (ref 0.26–1.65)
Lambda free light chains: 9.9 mg/L (ref 5.7–26.3)

## 2022-07-06 ENCOUNTER — Inpatient Hospital Stay: Payer: Medicare (Managed Care)

## 2022-07-06 VITALS — BP 130/74 | HR 79 | Temp 98.1°F | Resp 16 | Ht 68.0 in | Wt 262.4 lb

## 2022-07-06 DIAGNOSIS — C9002 Multiple myeloma in relapse: Secondary | ICD-10-CM

## 2022-07-06 DIAGNOSIS — Z5111 Encounter for antineoplastic chemotherapy: Secondary | ICD-10-CM | POA: Diagnosis not present

## 2022-07-06 LAB — CMP (CANCER CENTER ONLY)
ALT: 16 U/L (ref 0–44)
AST: 10 U/L — ABNORMAL LOW (ref 15–41)
Albumin: 3.9 g/dL (ref 3.5–5.0)
Alkaline Phosphatase: 86 U/L (ref 38–126)
Anion gap: 5 (ref 5–15)
BUN: 15 mg/dL (ref 8–23)
CO2: 30 mmol/L (ref 22–32)
Calcium: 10.3 mg/dL (ref 8.9–10.3)
Chloride: 111 mmol/L (ref 98–111)
Creatinine: 0.77 mg/dL (ref 0.61–1.24)
GFR, Estimated: >60 mL/min (ref >60)
Glucose, Bld: 104 mg/dL — ABNORMAL HIGH (ref 70–99)
Potassium: 3.8 mmol/L (ref 3.5–5.1)
Sodium: 146 mmol/L — ABNORMAL HIGH (ref 135–145)
Total Bilirubin: 0.6 mg/dL (ref 0.3–1.2)
Total Protein: 6.1 g/dL — ABNORMAL LOW (ref 6.5–8.1)

## 2022-07-06 LAB — LACTATE DEHYDROGENASE: LDH: 141 U/L (ref 98–192)

## 2022-07-06 LAB — CBC WITH DIFFERENTIAL (CANCER CENTER ONLY)
Abs Immature Granulocytes: 0.06 10*3/uL (ref 0.00–0.07)
Basophils Absolute: 0 10*3/uL (ref 0.0–0.1)
Basophils Relative: 0 %
Eosinophils Absolute: 0.2 10*3/uL (ref 0.0–0.5)
Eosinophils Relative: 3 %
HCT: 33.7 % — ABNORMAL LOW (ref 39.0–52.0)
Hemoglobin: 11.7 g/dL — ABNORMAL LOW (ref 13.0–17.0)
Immature Granulocytes: 1 %
Lymphocytes Relative: 12 %
Lymphs Abs: 1 10*3/uL (ref 0.7–4.0)
MCH: 29.4 pg (ref 26.0–34.0)
MCHC: 34.7 g/dL (ref 30.0–36.0)
MCV: 84.7 fL (ref 80.0–100.0)
Monocytes Absolute: 0.7 10*3/uL (ref 0.1–1.0)
Monocytes Relative: 9 %
Neutro Abs: 6.3 10*3/uL (ref 1.7–7.7)
Neutrophils Relative %: 75 %
Platelet Count: 236 10*3/uL (ref 150–400)
RBC: 3.98 MIL/uL — ABNORMAL LOW (ref 4.22–5.81)
RDW: 16.6 % — ABNORMAL HIGH (ref 11.5–15.5)
WBC Count: 8.2 10*3/uL (ref 4.0–10.5)
nRBC: 0.4 % — ABNORMAL HIGH (ref 0.0–0.2)

## 2022-07-06 LAB — URIC ACID: Uric Acid, Serum: 6.8 mg/dL (ref 3.7–8.6)

## 2022-07-06 MED ORDER — SODIUM CHLORIDE 0.9 % IV SOLN
40.0000 mg | Freq: Once | INTRAVENOUS | Status: AC
  Administered 2022-07-06: 40 mg via INTRAVENOUS
  Filled 2022-07-06: qty 4

## 2022-07-06 MED ORDER — DEXTROSE 5 % IV SOLN
70.0000 mg/m2 | Freq: Once | INTRAVENOUS | Status: AC
  Administered 2022-07-06: 150 mg via INTRAVENOUS
  Filled 2022-07-06: qty 60

## 2022-07-06 MED ORDER — SODIUM CHLORIDE 0.9 % IV SOLN: Freq: Once | INTRAVENOUS | Status: AC

## 2022-07-06 NOTE — Patient Instructions (Signed)
[facility] ONCOLOGY  Discharge Instructions: Thank you for choosing [facility] to provide your oncology and hematology care.   If you have a lab appointment with the [facility], please go directly to the [facility] and check in at the registration area.   Wear comfortable clothing and clothing appropriate for easy access to any Portacath or PICC line.   We strive to give you quality time with your provider. You may need to reschedule your appointment if you arrive late (15 or more minutes).  Arriving late affects you and other patients whose appointments are after yours.  Also, if you miss three or more appointments without notifying the office, you may be dismissed from the clinic at the provider's discretion.      For prescription refill requests, have your pharmacy contact our office and allow 72 hours for refills to be completed.    Today you received the following chemotherapy and/or immunotherapy agents kyprolis      To help prevent nausea and vomiting after your treatment, we encourage you to take your nausea medication as directed.  BELOW ARE SYMPTOMS THAT SHOULD BE REPORTED IMMEDIATELY: *FEVER GREATER THAN 100.4 F (38 C) OR HIGHER *CHILLS OR SWEATING *NAUSEA AND VOMITING THAT IS NOT CONTROLLED WITH YOUR NAUSEA MEDICATION *UNUSUAL SHORTNESS OF BREATH *UNUSUAL BRUISING OR BLEEDING *URINARY PROBLEMS (pain or burning when urinating, or frequent urination) *BOWEL PROBLEMS (unusual diarrhea, constipation, pain near the anus) TENDERNESS IN MOUTH AND THROAT WITH OR WITHOUT PRESENCE OF ULCERS (sore throat, sores in mouth, or a toothache) UNUSUAL RASH, SWELLING OR PAIN  UNUSUAL VAGINAL DISCHARGE OR ITCHING   Items with * indicate a potential emergency and should be followed up as soon as possible or go to the Emergency Department if any problems should occur.  Please show the CHEMOTHERAPY ALERT CARD or IMMUNOTHERAPY ALERT CARD at check-in to  the Emergency Department and triage nurse.  Should you have questions after your visit or need to cancel or reschedule your appointment, please contact [facility]  Dept: [phone]  and follow the prompts.  Office hours are 8:00 a.m. to 4:30 p.m. Monday - Friday. Please note that voicemails left after 4:00 p.m. may not be returned until the following business day.  We are closed weekends and major holidays. You have access to a nurse at all times for urgent questions. Please call the main number to the clinic Dept: [phone] and follow the prompts.   For any non-urgent questions, you may also contact your provider using MyChart. We now offer e-Visits for anyone 22 and older to request care online for non-urgent symptoms. For details visit [email or web address].   Also download the MyChart app! Go to the app store, search "MyChart", open the app, select [facility], and log in with your MyChart username and password.  Masks are optional in the cancer centers. If you would like for your care team to wear a mask while they are taking care of you, please let them know. You may have one support person who is at least 67 years old accompany you for your appointments.

## 2022-07-07 ENCOUNTER — Ambulatory Visit: Payer: Medicare (Managed Care)

## 2022-07-07 ENCOUNTER — Other Ambulatory Visit: Payer: Medicare (Managed Care)

## 2022-07-07 LAB — MULTIPLE MYELOMA PANEL, SERUM
Albumin SerPl Elph-Mcnc: 3.3 g/dL (ref 2.9–4.4)
Albumin/Glob SerPl: 1.3 (ref 0.7–1.7)
Alpha 1: 0.3 g/dL (ref 0.0–0.4)
Alpha2 Glob SerPl Elph-Mcnc: 0.8 g/dL (ref 0.4–1.0)
B-Globulin SerPl Elph-Mcnc: 0.9 g/dL (ref 0.7–1.3)
Gamma Glob SerPl Elph-Mcnc: 0.6 g/dL (ref 0.4–1.8)
Globulin, Total: 2.6 g/dL (ref 2.2–3.9)
IgA: 36 mg/dL — ABNORMAL LOW (ref 61–437)
IgG (Immunoglobin G), Serum: 588 mg/dL — ABNORMAL LOW (ref 603–1613)
IgM (Immunoglobulin M), Srm: 20 mg/dL (ref 20–172)
M Protein SerPl Elph-Mcnc: 0.2 g/dL — ABNORMAL HIGH
Total Protein ELP: 5.9 g/dL — ABNORMAL LOW (ref 6.0–8.5)

## 2022-07-07 LAB — KAPPA/LAMBDA LIGHT CHAINS
Kappa free light chain: 5.9 mg/L (ref 3.3–19.4)
Kappa, lambda light chain ratio: 1.11 (ref 0.26–1.65)
Lambda free light chains: 5.3 mg/L — ABNORMAL LOW (ref 5.7–26.3)

## 2022-07-11 ENCOUNTER — Telehealth: Payer: Self-pay | Admitting: Hematology and Oncology

## 2022-07-11 LAB — MULTIPLE MYELOMA PANEL, SERUM
Albumin SerPl Elph-Mcnc: 3.4 g/dL (ref 2.9–4.4)
Albumin/Glob SerPl: 1.5 (ref 0.7–1.7)
Alpha 1: 0.3 g/dL (ref 0.0–0.4)
Alpha2 Glob SerPl Elph-Mcnc: 0.7 g/dL (ref 0.4–1.0)
B-Globulin SerPl Elph-Mcnc: 0.9 g/dL (ref 0.7–1.3)
Gamma Glob SerPl Elph-Mcnc: 0.5 g/dL (ref 0.4–1.8)
Globulin, Total: 2.4 g/dL (ref 2.2–3.9)
IgA: 28 mg/dL — ABNORMAL LOW (ref 61–437)
IgG (Immunoglobin G), Serum: 563 mg/dL — ABNORMAL LOW (ref 603–1613)
IgM (Immunoglobulin M), Srm: 19 mg/dL — ABNORMAL LOW (ref 20–172)
M Protein SerPl Elph-Mcnc: 0.2 g/dL — ABNORMAL HIGH
Total Protein ELP: 5.8 g/dL — ABNORMAL LOW (ref 6.0–8.5)

## 2022-07-11 NOTE — Telephone Encounter (Signed)
Patient called to change time of 12/22 appointment. R/s for patient.

## 2022-07-14 ENCOUNTER — Inpatient Hospital Stay: Payer: Medicare (Managed Care)

## 2022-07-14 ENCOUNTER — Inpatient Hospital Stay: Payer: Medicare (Managed Care) | Admitting: Hematology and Oncology

## 2022-07-14 VITALS — BP 134/62 | HR 88 | Temp 98.1°F | Resp 16

## 2022-07-14 DIAGNOSIS — C9002 Multiple myeloma in relapse: Secondary | ICD-10-CM

## 2022-07-14 DIAGNOSIS — Z5111 Encounter for antineoplastic chemotherapy: Secondary | ICD-10-CM | POA: Diagnosis not present

## 2022-07-14 LAB — CMP (CANCER CENTER ONLY)
ALT: 17 U/L (ref 0–44)
AST: 12 U/L — ABNORMAL LOW (ref 15–41)
Albumin: 3.7 g/dL (ref 3.5–5.0)
Alkaline Phosphatase: 84 U/L (ref 38–126)
Anion gap: 5 (ref 5–15)
BUN: 18 mg/dL (ref 8–23)
CO2: 27 mmol/L (ref 22–32)
Calcium: 9.8 mg/dL (ref 8.9–10.3)
Chloride: 110 mmol/L (ref 98–111)
Creatinine: 0.79 mg/dL (ref 0.61–1.24)
GFR, Estimated: >60 mL/min (ref >60)
Glucose, Bld: 159 mg/dL — ABNORMAL HIGH (ref 70–99)
Potassium: 3.7 mmol/L (ref 3.5–5.1)
Sodium: 142 mmol/L (ref 135–145)
Total Bilirubin: 0.6 mg/dL (ref 0.3–1.2)
Total Protein: 6.3 g/dL — ABNORMAL LOW (ref 6.5–8.1)

## 2022-07-14 LAB — CBC WITH DIFFERENTIAL (CANCER CENTER ONLY)
Abs Immature Granulocytes: 0.08 10*3/uL — ABNORMAL HIGH (ref 0.00–0.07)
Basophils Absolute: 0 10*3/uL (ref 0.0–0.1)
Basophils Relative: 0 %
Eosinophils Absolute: 0.4 10*3/uL (ref 0.0–0.5)
Eosinophils Relative: 4 %
HCT: 32.6 % — ABNORMAL LOW (ref 39.0–52.0)
Hemoglobin: 11.5 g/dL — ABNORMAL LOW (ref 13.0–17.0)
Immature Granulocytes: 1 %
Lymphocytes Relative: 10 %
Lymphs Abs: 0.9 10*3/uL (ref 0.7–4.0)
MCH: 29.6 pg (ref 26.0–34.0)
MCHC: 35.3 g/dL (ref 30.0–36.0)
MCV: 84 fL (ref 80.0–100.0)
Monocytes Absolute: 1 10*3/uL (ref 0.1–1.0)
Monocytes Relative: 11 %
Neutro Abs: 7.3 10*3/uL (ref 1.7–7.7)
Neutrophils Relative %: 74 %
Platelet Count: 227 10*3/uL (ref 150–400)
RBC: 3.88 MIL/uL — ABNORMAL LOW (ref 4.22–5.81)
RDW: 16.3 % — ABNORMAL HIGH (ref 11.5–15.5)
WBC Count: 9.7 10*3/uL (ref 4.0–10.5)
nRBC: 0 % (ref 0.0–0.2)

## 2022-07-14 LAB — URIC ACID: Uric Acid, Serum: 5 mg/dL (ref 3.7–8.6)

## 2022-07-14 LAB — LACTATE DEHYDROGENASE: LDH: 132 U/L (ref 98–192)

## 2022-07-14 MED ORDER — SODIUM CHLORIDE 0.9 % IV SOLN: Freq: Once | INTRAVENOUS | Status: AC

## 2022-07-14 MED ORDER — DEXTROSE 5 % IV SOLN
70.0000 mg/m2 | Freq: Once | INTRAVENOUS | Status: AC
  Administered 2022-07-14: 150 mg via INTRAVENOUS
  Filled 2022-07-14: qty 60

## 2022-07-14 MED ORDER — SODIUM CHLORIDE 0.9 % IV SOLN
40.0000 mg | Freq: Once | INTRAVENOUS | Status: AC
  Administered 2022-07-14: 40 mg via INTRAVENOUS
  Filled 2022-07-14: qty 4

## 2022-07-14 NOTE — Patient Instructions (Signed)
[facility] ONCOLOGY  Discharge Instructions: Thank you for choosing [facility] to provide your oncology and hematology care.   If you have a lab appointment with the [facility], please go directly to the [facility] and check in at the registration area.   Wear comfortable clothing and clothing appropriate for easy access to any Portacath or PICC line.   We strive to give you quality time with your provider. You may need to reschedule your appointment if you arrive late (15 or more minutes).  Arriving late affects you and other patients whose appointments are after yours.  Also, if you miss three or more appointments without notifying the office, you may be dismissed from the clinic at the provider's discretion.      For prescription refill requests, have your pharmacy contact our office and allow 72 hours for refills to be completed.    Today you received the following chemotherapy and/or immunotherapy agents: Kyprolis     To help prevent nausea and vomiting after your treatment, we encourage you to take your nausea medication as directed.  BELOW ARE SYMPTOMS THAT SHOULD BE REPORTED IMMEDIATELY: *FEVER GREATER THAN 100.4 F (38 C) OR HIGHER *CHILLS OR SWEATING *NAUSEA AND VOMITING THAT IS NOT CONTROLLED WITH YOUR NAUSEA MEDICATION *UNUSUAL SHORTNESS OF BREATH *UNUSUAL BRUISING OR BLEEDING *URINARY PROBLEMS (pain or burning when urinating, or frequent urination) *BOWEL PROBLEMS (unusual diarrhea, constipation, pain near the anus) TENDERNESS IN MOUTH AND THROAT WITH OR WITHOUT PRESENCE OF ULCERS (sore throat, sores in mouth, or a toothache) UNUSUAL RASH, SWELLING OR PAIN  UNUSUAL VAGINAL DISCHARGE OR ITCHING   Items with * indicate a potential emergency and should be followed up as soon as possible or go to the Emergency Department if any problems should occur.  Please show the CHEMOTHERAPY ALERT CARD or IMMUNOTHERAPY ALERT CARD at check-in to  the Emergency Department and triage nurse.  Should you have questions after your visit or need to cancel or reschedule your appointment, please contact [facility]  Dept: [phone]  and follow the prompts.  Office hours are 8:00 a.m. to 4:30 p.m. Monday - Friday. Please note that voicemails left after 4:00 p.m. may not be returned until the following business day.  We are closed weekends and major holidays. You have access to a nurse at all times for urgent questions. Please call the main number to the clinic Dept: [phone] and follow the prompts.   For any non-urgent questions, you may also contact your provider using MyChart. We now offer e-Visits for anyone 82 and older to request care online for non-urgent symptoms. For details visit [email or web address].   Also download the MyChart app! Go to the app store, search "MyChart", open the app, select [facility], and log in with your MyChart username and password.  Masks are optional in the cancer centers. If you would like for your care team to wear a mask while they are taking care of you, please let them know. You may have one support person who is at least 68 years old accompany you for your appointments.

## 2022-07-26 ENCOUNTER — Telehealth: Payer: Self-pay | Admitting: Physician Assistant

## 2022-07-26 NOTE — Telephone Encounter (Signed)
Attempted to reach facility for 3 rd time to r/s appointments for patient. Will attempted again 1/4

## 2022-07-27 ENCOUNTER — Telehealth: Payer: Self-pay | Admitting: Hematology and Oncology

## 2022-07-27 NOTE — Telephone Encounter (Signed)
Called patient x5 between 1/2 & 1/4 to reschedule 1/12 appointment time for patient's transportation. Patient r/s and notified.

## 2022-07-28 ENCOUNTER — Inpatient Hospital Stay: Payer: Medicare (Managed Care)

## 2022-07-28 ENCOUNTER — Inpatient Hospital Stay: Payer: Medicare (Managed Care) | Attending: Physician Assistant

## 2022-07-28 ENCOUNTER — Other Ambulatory Visit: Payer: Self-pay

## 2022-07-28 ENCOUNTER — Inpatient Hospital Stay: Payer: Medicare (Managed Care) | Admitting: Hematology and Oncology

## 2022-07-28 VITALS — BP 144/78 | HR 77 | Resp 15

## 2022-07-28 VITALS — BP 141/82 | HR 76 | Temp 97.3°F | Resp 15 | Wt 262.2 lb

## 2022-07-28 DIAGNOSIS — Z7901 Long term (current) use of anticoagulants: Secondary | ICD-10-CM | POA: Diagnosis not present

## 2022-07-28 DIAGNOSIS — Z5111 Encounter for antineoplastic chemotherapy: Secondary | ICD-10-CM | POA: Insufficient documentation

## 2022-07-28 DIAGNOSIS — C9002 Multiple myeloma in relapse: Secondary | ICD-10-CM | POA: Insufficient documentation

## 2022-07-28 DIAGNOSIS — Z86718 Personal history of other venous thrombosis and embolism: Secondary | ICD-10-CM | POA: Diagnosis not present

## 2022-07-28 LAB — CMP (CANCER CENTER ONLY)
ALT: 17 U/L (ref 0–44)
AST: 12 U/L — ABNORMAL LOW (ref 15–41)
Albumin: 3.7 g/dL (ref 3.5–5.0)
Alkaline Phosphatase: 82 U/L (ref 38–126)
Anion gap: 6 (ref 5–15)
BUN: 8 mg/dL (ref 8–23)
CO2: 27 mmol/L (ref 22–32)
Calcium: 9.1 mg/dL (ref 8.9–10.3)
Chloride: 109 mmol/L (ref 98–111)
Creatinine: 0.74 mg/dL (ref 0.61–1.24)
GFR, Estimated: >60 mL/min (ref >60)
Glucose, Bld: 178 mg/dL — ABNORMAL HIGH (ref 70–99)
Potassium: 3.9 mmol/L (ref 3.5–5.1)
Sodium: 142 mmol/L (ref 135–145)
Total Bilirubin: 0.6 mg/dL (ref 0.3–1.2)
Total Protein: 5.9 g/dL — ABNORMAL LOW (ref 6.5–8.1)

## 2022-07-28 LAB — CBC WITH DIFFERENTIAL (CANCER CENTER ONLY)
Abs Immature Granulocytes: 0.04 10*3/uL (ref 0.00–0.07)
Basophils Absolute: 0.1 10*3/uL (ref 0.0–0.1)
Basophils Relative: 1 %
Eosinophils Absolute: 0.4 10*3/uL (ref 0.0–0.5)
Eosinophils Relative: 5 %
HCT: 30.9 % — ABNORMAL LOW (ref 39.0–52.0)
Hemoglobin: 10.8 g/dL — ABNORMAL LOW (ref 13.0–17.0)
Immature Granulocytes: 1 %
Lymphocytes Relative: 12 %
Lymphs Abs: 0.9 10*3/uL (ref 0.7–4.0)
MCH: 29.4 pg (ref 26.0–34.0)
MCHC: 35 g/dL (ref 30.0–36.0)
MCV: 84.2 fL (ref 80.0–100.0)
Monocytes Absolute: 0.8 10*3/uL (ref 0.1–1.0)
Monocytes Relative: 10 %
Neutro Abs: 5.6 10*3/uL (ref 1.7–7.7)
Neutrophils Relative %: 71 %
Platelet Count: 427 10*3/uL — ABNORMAL HIGH (ref 150–400)
RBC: 3.67 MIL/uL — ABNORMAL LOW (ref 4.22–5.81)
RDW: 16.5 % — ABNORMAL HIGH (ref 11.5–15.5)
WBC Count: 7.7 10*3/uL (ref 4.0–10.5)
nRBC: 0 % (ref 0.0–0.2)

## 2022-07-28 MED ORDER — DEXTROSE 5 % IV SOLN
70.0000 mg/m2 | Freq: Once | INTRAVENOUS | Status: AC
  Administered 2022-07-28: 150 mg via INTRAVENOUS
  Filled 2022-07-28: qty 60

## 2022-07-28 MED ORDER — ONDANSETRON HCL 8 MG PO TABS: 8.0000 mg | ORAL_TABLET | Freq: Three times a day (TID) | ORAL | 0 refills | Status: DC | PRN

## 2022-07-28 MED ORDER — SODIUM CHLORIDE 0.9 % IV SOLN: Freq: Once | INTRAVENOUS | Status: AC

## 2022-07-28 MED ORDER — SODIUM CHLORIDE 0.9 % IV SOLN
40.0000 mg | Freq: Once | INTRAVENOUS | Status: AC
  Administered 2022-07-28: 40 mg via INTRAVENOUS
  Filled 2022-07-28: qty 4

## 2022-07-28 NOTE — Patient Instructions (Signed)
[facility] ONCOLOGY  Discharge Instructions: Thank you for choosing [facility] to provide your oncology and hematology care.   If you have a lab appointment with the [facility], please go directly to the [facility] and check in at the registration area.   Wear comfortable clothing and clothing appropriate for easy access to any Portacath or PICC line.   We strive to give you quality time with your provider. You may need to reschedule your appointment if you arrive late (15 or more minutes).  Arriving late affects you and other patients whose appointments are after yours.  Also, if you miss three or more appointments without notifying the office, you may be dismissed from the clinic at the provider's discretion.      For prescription refill requests, have your pharmacy contact our office and allow 72 hours for refills to be completed.    Today you received the following chemotherapy and/or immunotherapy agents: Kyprolis      To help prevent nausea and vomiting after your treatment, we encourage you to take your nausea medication as directed.  BELOW ARE SYMPTOMS THAT SHOULD BE REPORTED IMMEDIATELY: *FEVER GREATER THAN 100.4 F (38 C) OR HIGHER *CHILLS OR SWEATING *NAUSEA AND VOMITING THAT IS NOT CONTROLLED WITH YOUR NAUSEA MEDICATION *UNUSUAL SHORTNESS OF BREATH *UNUSUAL BRUISING OR BLEEDING *URINARY PROBLEMS (pain or burning when urinating, or frequent urination) *BOWEL PROBLEMS (unusual diarrhea, constipation, pain near the anus) TENDERNESS IN MOUTH AND THROAT WITH OR WITHOUT PRESENCE OF ULCERS (sore throat, sores in mouth, or a toothache) UNUSUAL RASH, SWELLING OR PAIN  UNUSUAL VAGINAL DISCHARGE OR ITCHING   Items with * indicate a potential emergency and should be followed up as soon as possible or go to the Emergency Department if any problems should occur.  Please show the CHEMOTHERAPY ALERT CARD or IMMUNOTHERAPY ALERT CARD at check-in to  the Emergency Department and triage nurse.  Should you have questions after your visit or need to cancel or reschedule your appointment, please contact [facility]  Dept: [phone]  and follow the prompts.  Office hours are 8:00 a.m. to 4:30 p.m. Monday - Friday. Please note that voicemails left after 4:00 p.m. may not be returned until the following business day.  We are closed weekends and major holidays. You have access to a nurse at all times for urgent questions. Please call the main number to the clinic Dept: [phone] and follow the prompts.   For any non-urgent questions, you may also contact your provider using MyChart. We now offer e-Visits for anyone 28 and older to request care online for non-urgent symptoms. For details visit [email or web address].   Also download the MyChart app! Go to the app store, search "MyChart", open the app, select [facility], and log in with your MyChart username and password.

## 2022-07-31 LAB — KAPPA/LAMBDA LIGHT CHAINS
Kappa free light chain: 11.9 mg/L (ref 3.3–19.4)
Kappa, lambda light chain ratio: 1.29 (ref 0.26–1.65)
Lambda free light chains: 9.2 mg/L (ref 5.7–26.3)

## 2022-08-02 LAB — MULTIPLE MYELOMA PANEL, SERUM
Albumin SerPl Elph-Mcnc: 3.2 g/dL (ref 2.9–4.4)
Albumin/Glob SerPl: 1.3 (ref 0.7–1.7)
Alpha 1: 0.3 g/dL (ref 0.0–0.4)
Alpha2 Glob SerPl Elph-Mcnc: 0.8 g/dL (ref 0.4–1.0)
B-Globulin SerPl Elph-Mcnc: 0.9 g/dL (ref 0.7–1.3)
Gamma Glob SerPl Elph-Mcnc: 0.6 g/dL (ref 0.4–1.8)
Globulin, Total: 2.5 g/dL (ref 2.2–3.9)
IgA: 43 mg/dL — ABNORMAL LOW (ref 61–437)
IgG (Immunoglobin G), Serum: 630 mg/dL (ref 603–1613)
IgM (Immunoglobulin M), Srm: 26 mg/dL (ref 20–172)
M Protein SerPl Elph-Mcnc: 0.2 g/dL — ABNORMAL HIGH
Total Protein ELP: 5.7 g/dL — ABNORMAL LOW (ref 6.0–8.5)

## 2022-08-04 ENCOUNTER — Inpatient Hospital Stay: Payer: Medicare (Managed Care)

## 2022-08-04 ENCOUNTER — Other Ambulatory Visit: Payer: Medicare (Managed Care)

## 2022-08-04 ENCOUNTER — Ambulatory Visit: Payer: Medicare (Managed Care)

## 2022-08-04 VITALS — BP 122/78 | HR 78 | Temp 98.2°F | Resp 15 | Wt 262.4 lb

## 2022-08-04 DIAGNOSIS — Z5111 Encounter for antineoplastic chemotherapy: Secondary | ICD-10-CM | POA: Diagnosis not present

## 2022-08-04 DIAGNOSIS — C9002 Multiple myeloma in relapse: Secondary | ICD-10-CM

## 2022-08-04 LAB — URIC ACID: Uric Acid, Serum: 5.8 mg/dL (ref 3.7–8.6)

## 2022-08-04 LAB — CBC WITH DIFFERENTIAL (CANCER CENTER ONLY)
Abs Immature Granulocytes: 0.05 10*3/uL (ref 0.00–0.07)
Basophils Absolute: 0 10*3/uL (ref 0.0–0.1)
Basophils Relative: 0 %
Eosinophils Absolute: 0.5 10*3/uL (ref 0.0–0.5)
Eosinophils Relative: 6 %
HCT: 36.4 % — ABNORMAL LOW (ref 39.0–52.0)
Hemoglobin: 12.5 g/dL — ABNORMAL LOW (ref 13.0–17.0)
Immature Granulocytes: 1 %
Lymphocytes Relative: 14 %
Lymphs Abs: 1.1 10*3/uL (ref 0.7–4.0)
MCH: 28.7 pg (ref 26.0–34.0)
MCHC: 34.3 g/dL (ref 30.0–36.0)
MCV: 83.7 fL (ref 80.0–100.0)
Monocytes Absolute: 1.2 10*3/uL — ABNORMAL HIGH (ref 0.1–1.0)
Monocytes Relative: 16 %
Neutro Abs: 4.8 10*3/uL (ref 1.7–7.7)
Neutrophils Relative %: 63 %
Platelet Count: 196 10*3/uL (ref 150–400)
RBC: 4.35 MIL/uL (ref 4.22–5.81)
RDW: 16.8 % — ABNORMAL HIGH (ref 11.5–15.5)
WBC Count: 7.7 10*3/uL (ref 4.0–10.5)
nRBC: 0 % (ref 0.0–0.2)

## 2022-08-04 LAB — CMP (CANCER CENTER ONLY)
ALT: 19 U/L (ref 0–44)
AST: 14 U/L — ABNORMAL LOW (ref 15–41)
Albumin: 3.8 g/dL (ref 3.5–5.0)
Alkaline Phosphatase: 88 U/L (ref 38–126)
Anion gap: 6 (ref 5–15)
BUN: 17 mg/dL (ref 8–23)
CO2: 28 mmol/L (ref 22–32)
Calcium: 9.8 mg/dL (ref 8.9–10.3)
Chloride: 106 mmol/L (ref 98–111)
Creatinine: 0.79 mg/dL (ref 0.61–1.24)
GFR, Estimated: >60 mL/min (ref >60)
Glucose, Bld: 100 mg/dL — ABNORMAL HIGH (ref 70–99)
Potassium: 3.6 mmol/L (ref 3.5–5.1)
Sodium: 140 mmol/L (ref 135–145)
Total Bilirubin: 0.9 mg/dL (ref 0.3–1.2)
Total Protein: 6.8 g/dL (ref 6.5–8.1)

## 2022-08-04 LAB — LACTATE DEHYDROGENASE: LDH: 180 U/L (ref 98–192)

## 2022-08-04 MED ORDER — SODIUM CHLORIDE 0.9 % IV SOLN
40.0000 mg | Freq: Once | INTRAVENOUS | Status: AC
  Administered 2022-08-04: 40 mg via INTRAVENOUS
  Filled 2022-08-04: qty 4

## 2022-08-04 MED ORDER — SODIUM CHLORIDE 0.9 % IV SOLN: Freq: Once | INTRAVENOUS | Status: DC

## 2022-08-04 MED ORDER — DEXTROSE 5 % IV SOLN
70.0000 mg/m2 | Freq: Once | INTRAVENOUS | Status: AC
  Administered 2022-08-04: 150 mg via INTRAVENOUS
  Filled 2022-08-04: qty 60

## 2022-08-04 MED ORDER — SODIUM CHLORIDE 0.9 % IV SOLN: Freq: Once | INTRAVENOUS | Status: AC

## 2022-08-04 NOTE — Patient Instructions (Signed)
[facility]  Discharge Instructions: Thank you for choosing [facility] to provide your oncology and hematology care.   If you have a lab appointment with the Cancer Center, please go directly to the Cancer Center and check in at the registration area.   Wear comfortable clothing and clothing appropriate for easy access to any Portacath or PICC line.   We strive to give you quality time with your provider. You may need to reschedule your appointment if you arrive late (15 or more minutes).  Arriving late affects you and other patients whose appointments are after yours.  Also, if you miss three or more appointments without notifying the office, you may be dismissed from the clinic at the provider's discretion.      For prescription refill requests, have your pharmacy contact our office and allow 72 hours for refills to be completed.    Today you received the following chemotherapy and/or immunotherapy agents: Kyprolis      To help prevent nausea and vomiting after your treatment, we encourage you to take your nausea medication as directed.  BELOW ARE SYMPTOMS THAT SHOULD BE REPORTED IMMEDIATELY: *FEVER GREATER THAN 100.4 F (38 C) OR HIGHER *CHILLS OR SWEATING *NAUSEA AND VOMITING THAT IS NOT CONTROLLED WITH YOUR NAUSEA MEDICATION *UNUSUAL SHORTNESS OF BREATH *UNUSUAL BRUISING OR BLEEDING *URINARY PROBLEMS (pain or burning when urinating, or frequent urination) *BOWEL PROBLEMS (unusual diarrhea, constipation, pain near the anus) TENDERNESS IN MOUTH AND THROAT WITH OR WITHOUT PRESENCE OF ULCERS (sore throat, sores in mouth, or a toothache) UNUSUAL RASH, SWELLING OR PAIN  UNUSUAL VAGINAL DISCHARGE OR ITCHING   Items with * indicate a potential emergency and should be followed up as soon as possible or go to the Emergency Department if any problems should occur.  Please show the CHEMOTHERAPY ALERT CARD or IMMUNOTHERAPY ALERT CARD at check-in to  the Emergency Department and triage nurse.  Should you have questions after your visit or need to cancel or reschedule your appointment, please contact [facility]  Dept: [phone]  and follow the prompts.  Office hours are 8:00 a.m. to 4:30 p.m. Monday - Friday. Please note that voicemails left after 4:00 p.m. may not be returned until the following business day.  We are closed weekends and major holidays. You have access to a nurse at all times for urgent questions. Please call the main number to the clinic Dept: [phone] and follow the prompts.   For any non-urgent questions, you may also contact your provider using MyChart. We now offer e-Visits for anyone 32 and older to request care online for non-urgent symptoms. For details visit [email or web address].   Also download the MyChart app! Go to the app store, search "MyChart", open the app, select , and log in with your MyChart username and password.

## 2022-08-07 ENCOUNTER — Ambulatory Visit: Payer: Medicare (Managed Care) | Admitting: Orthopedic Surgery

## 2022-08-07 ENCOUNTER — Encounter: Payer: Self-pay | Admitting: Hematology and Oncology

## 2022-08-07 NOTE — Progress Notes (Signed)
[facility] [phone]   Fax:([phone]  PROGRESS NOTE  Patient Care Team: Jethro Bastos, MD as PCP - General (Family Medicine) Ranelle Oyster, MD as Consulting Physician (Physical Medicine and Rehabilitation) Hildred Alamin Mcarthur Rossetti, PA-C as Physician Assistant (Physical Medicine and Rehabilitation) Jaci Standard, MD as Consulting Physician (Hematology and Oncology)  Hematological/Oncological History # IgG Lambda Multiple Myeloma, Relapsed (ISS Stage II) 1) 06/2010: initial diagnosis of Multiple Myeloma after T8 compression fracture. Treated with Velcade/Revlimid/Dexamethasone and achieved a complete remission 2) Velcade was discontinued in September 2012 and that Revlimid and Decadron were discontinued in March 2013. 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 4) Followed by Dr. Bertis Ruddy, last clinic visit 10/09/2019. At that time there was concern for relapse of his multiple myeloma.  5) Patient requested transfer to different provider after misunderstanding regarding imaging studies 6) 12/17/2019: transfer care to Dr. Leonides Schanz  7) 01/09/2020: Cycle 1 Day 1 of Dara/Velcade/Dex 8) 01/21/2020: presented as urgent visit for diarrhea and dehydration. Holding chemotherapy scheduled for 01/23/2020. 9) 01/30/2020: Resume dara/velcade/dex after resolution of diarrhea.  10) 02/13/2020: restaging labs show M protein 0.8, Kappa 4.5, lamba 17.2, ratio 0.26, urine M protein 53 (7.1%). All MM labs indicate improvement.  11) 03/10/2020: Cycle 4 Day 1 of Dara/Velcade/Dex. Transition to q 3 week daratumumab.  12) 06/04/2020:  Cycle 8 Day 1 of Dara/Velcade/Dex 13) 06/25/2020:  Cycle 9 Day 1 of Dara/Velcade/Dex 14) 07/22/2020: Cycle 10 Day 1 of  Dara//Dex 15) 08/18/2020: Cycle 11 Day 1 of Dara//Dex 16) 09/23/2020: Cycle 12 Day 1 of Dara//Dex 17) 10/22/2020: Cycle 13 Day 1 of Dara/Dex   18) 11/19/2020: Cycle 14 Day 1 of Dara/Dex  19) 12/17/2020: Cycle 15 Day 1 of Dara/Dex  20) 01/17/2021: Cycle 16 Day 1 of Dara/Dex  21) 02/11/2021: Cycle 17 Day 1 of Dara/Dex 22) 03/10/2021: Cycle 18 Day 1 of Dara/Dex  23) 04/08/2021: Cycle 19 Day 1 of Dara/Dex  24) 08/03/2021: Cycle 20 Day 1 of Dara/Dex (delayed due to scheduling error) 25) 09/02/2021: Cycle 21 Day 1 of Dara/Dex 26) 09/30/2021: Cycle 22 Day 1 of Dara/Dex 27) 10/28/2021: Cycle 23 Day 1 of Dara/Dex 28) 12/02/2021: Cycle 24 Day 1 of Dara/Dex 29) 12/30/2021: Cycle 25 Day 1 of Dara/Dex 30) 01/06/2022: Cycle 1 Day 1 of Kyprolis/Dex 31) 02/03/2022: Cycle 2 Day 1 of Kyprolis/Dex 32) 02/24/2022: Cycle 3 Day 1 of Kyprolis/Dex 33) 04/07/2022: Cycle 4 Day 1 of Kyprolis/Dex 34) 05/05/2022: Cycle 5 Day 1 of Kyprolis/Dex 35) 06/01/2022: Cycle 6 Day 1 of Kyprolis/Dex 36) 06/29/2022: Cycle 7 Day 1 of Kyprolis/Dex 37) 07/28/2022: Cycle 8 Day 1 of Kyprolis/Dex  Interval History:  OTTAVIO NOREM III 67 y.o. male with medical history significant for  IgG Lambda Multiple Myeloma who presents for a follow up visit. The patient's last visit was on 06/30/2022.  He presents today to for cycle 8 day 1 of Kyprolis/Dex.   On exam today Mr. Cieslewicz reports he had a good holiday season.  He notes that he is celebrating his anniversary having been married for 24 years.  He notes that he feels like he has good strength and good energy.  His weight has also been steady at 262 pounds, however he notes he would like to weigh less.  He notes that he is not having any major side effects as a result of his Kyprolis therapy.  He does have some occasional  nausea but no vomiting or diarrhea.  He also notes no fevers, chills, sweats.  Overall he is at his baseline level of health and is willing and able to proceed with treatment at this time.  A full 10 point ROS was otherwise negative.    MEDICAL HISTORY:  Past Medical History:  Diagnosis Date   Adrenal insufficiency ([place])    on  chronic dexamethasone   Anemia    Cancer ([place])    Coagulopathy ([place])    on xeralto/ s/p DVT while on coumadin,  IVC in place   Diabetes mellitus without complication ([place])    type 2   Gross hematuria 7/14   post foley cath procedure   History of blood transfusion 7/14   Multiple myeloma    thoracic T8 with paraplegia s/p resection- on chemo at visit 10/13/10   Multiple myeloma    Multiple myeloma without mention of remission    Neurogenic bladder    Neurogenic bowel    Paraplegia ([place])    Partial small bowel obstruction ([place]) during dec 2011 admission    SURGICAL HISTORY: Past Surgical History:  Procedure Laterality Date   COLONOSCOPY WITH PROPOFOL N/A 04/12/2017   Procedure: COLONOSCOPY WITH PROPOFOL;  Surgeon: Irene Shipper, MD;  Location: [facility];  Service: Endoscopy;  Laterality: N/A;   COLONOSCOPY WITH PROPOFOL N/A 04/19/2017   Procedure: COLONOSCOPY WITH PROPOFOL;  Surgeon: Yetta Flock, MD;  Location: [facility];  Service: Gastroenterology;  Laterality: N/A;   COLOSTOMY  07/20/2011   Procedure: COLOSTOMY;  Surgeon: Judieth Keens, DO;  Location: [place];  Service: General;;   COLOSTOMY REVISION  07/20/2011   Procedure: COLON RESECTION SIGMOID;  Surgeon: Judieth Keens, DO;  Location: [place];  Service: General;;   CYSTOSCOPY N/A 04/04/2013   Procedure: CYSTOSCOPY WITH LITHALOPAXY;  Surgeon: Alexis Frock, MD;  Location: [facility];  Service: Urology;  Laterality: N/A;   INSERTION OF SUPRAPUBIC CATHETER N/A 04/04/2013   Procedure: INSERTION OF SUPRAPUBIC CATHETER;  Surgeon: Alexis Frock, MD;  Location: [facility];  Service: Urology;  Laterality: N/A;   LAPAROTOMY  07/20/2011   Procedure: EXPLORATORY LAPAROTOMY;  Surgeon: Judieth Keens, DO;  Location: [place];  Service: General;  Laterality: N/A;   myeloma thoracic T8 with parpaplegia s/p thoracotomy and thoracic T7-9 cage placement on Dec 26th 2011  07/18/10    SOCIAL HISTORY: Social History    Socioeconomic History   Marital status: Married    Spouse name: Not on file   Number of children: Not on file   Years of education: Not on file   Highest education level: Not on file  Occupational History   Not on file  Tobacco Use   Smoking status: Never   Smokeless tobacco: Never  Vaping Use   Vaping Use: Never used  Substance and Sexual Activity   Alcohol use: No   Drug use: No   Sexual activity: Never  Other Topics Concern   Not on file  Social History Narrative   Not on file   Social Determinants of Health   Financial Resource Strain: Not on file  Food Insecurity: Not on file  Transportation Needs: Not on file  Physical Activity: Not on file  Stress: Not on file  Social Connections: Not on file  Intimate Partner Violence: Not on file    FAMILY HISTORY: Family History  Problem Relation Age of Onset   Ovarian cancer Mother    Diabetes Father     ALLERGIES:  is  allergic to feraheme [ferumoxytol].  MEDICATIONS:  Current Outpatient Medications  Medication Sig Dispense Refill   acyclovir (ZOVIRAX) 400 MG tablet Take 1 tablet (400 mg total) by mouth 2 (two) times daily. 60 tablet 2   atorvastatin (LIPITOR) 10 MG tablet Take 10 mg by mouth daily.     baclofen (LIORESAL) 20 MG tablet Take 20 mg by mouth 2 (two) times daily.     diazepam (DIASTAT ACUDIAL) 10 MG GEL SMARTSIG:By Mouth     dorzolamide (TRUSOPT) 2 % ophthalmic solution 1 drop 2 (two) times daily.     iron polysaccharides (NU-IRON) 150 MG capsule Take 1 capsule (150 mg total) by mouth daily. 30 capsule 0   latanoprost (XALATAN) 0.005 % ophthalmic solution Place 1 drop into both eyes at bedtime.     ondansetron (ZOFRAN) 8 MG tablet Take 1 tablet (8 mg total) by mouth every 8 (eight) hours as needed for nausea or vomiting. 30 tablet 0   rivaroxaban (XARELTO) 10 MG TABS tablet Take 1 tablet (10 mg total) by mouth daily with supper. 30 tablet 9   Skin Protectants, Misc. (EUCERIN) cream Apply 1 application  topically 2 (two) times daily as needed for dry skin.      zinc oxide (BALMEX) 11.3 % CREA cream Apply 1 application topically 2 (two) times daily.     No current facility-administered medications for this visit.   Facility-Administered Medications Ordered in Other Visits  Medication Dose Route Frequency Provider Last Rate Last Admin   sodium chloride flush (NS) 0.9 % injection 10 mL  10 mL Intravenous PRN Bertis Ruddy, Ni, MD        REVIEW OF SYSTEMS:   Constitutional: ( - ) fevers, ( - )  chills , ( - ) night sweats Eyes: ( - ) blurriness of vision, ( - ) double vision, ( - ) watery eyes Ears, nose, mouth, throat, and face: ( - ) mucositis, ( - ) sore throat Respiratory: ( - ) cough, ( - ) dyspnea, ( - ) wheezes Cardiovascular: ( - ) palpitation, ( - ) chest discomfort, ( - ) lower extremity swelling Gastrointestinal:  ( - ) nausea, ( - ) heartburn, ( - ) change in bowel habits Skin: ( - ) abnormal skin rashes Lymphatics: ( - ) new lymphadenopathy, ( - ) easy bruising Neurological: ( - ) numbness, ( - ) tingling, ( - ) new weaknesses Behavioral/Psych: ( - ) mood change, ( - ) new changes  All other systems were reviewed with the patient and are negative.  PHYSICAL EXAMINATION: ECOG PERFORMANCE STATUS: paraplegic.   Vitals:   07/28/22 1216  BP: (!) 141/82  Pulse: 76  Resp: 15  Temp: (!) 97.3 F (36.3 C)  SpO2: 100%       Filed Weights   07/28/22 1216  Weight: 262 lb 3.2 oz (118.9 kg)     GENERAL: well appearing middle aged [place] American male alert, no distress and comfortable SKIN: skin color, texture, turgor are normal, no rashes or significant lesions EYES: conjunctiva are pink and non-injected, sclera clear LUNGS: clear to auscultation and percussion with normal breathing effort HEART: regular rate & rhythm and no murmurs and no lower extremity edema Musculoskeletal: no cyanosis of digits and no clubbing  PSYCH: alert & oriented x 3, fluent speech NEURO:  paraplegic, no use of LE bilaterally.   LABORATORY DATA:  I have reviewed the data as listed    Latest Ref Rng & Units 08/04/2022   12:12 PM  07/28/2022   10:23 AM 07/14/2022    8:14 AM  CBC  WBC 4.0 - 10.5 K/uL 7.7  7.7  9.7   Hemoglobin 13.0 - 17.0 g/dL 12.5  10.8  11.5   Hematocrit 39.0 - 52.0 % 36.4  30.9  32.6   Platelets 150 - 400 K/uL 196  427  227        Latest Ref Rng & Units 08/04/2022   12:12 PM 07/28/2022   10:23 AM 07/14/2022    8:14 AM  CMP  Glucose 70 - 99 mg/dL 100  178  159   BUN 8 - 23 mg/dL 17  8  18    Creatinine 0.61 - 1.24 mg/dL 0.79  0.74  0.79   Sodium 135 - 145 mmol/L 140  142  142   Potassium 3.5 - 5.1 mmol/L 3.6  3.9  3.7   Chloride 98 - 111 mmol/L 106  109  110   CO2 22 - 32 mmol/L 28  27  27    Calcium 8.9 - 10.3 mg/dL 9.8  9.1  9.8   Total Protein 6.5 - 8.1 g/dL 6.8  5.9  6.3   Total Bilirubin 0.3 - 1.2 mg/dL 0.9  0.6  0.6   Alkaline Phos 38 - 126 U/L 88  82  84   AST 15 - 41 U/L 14  12  12    ALT 0 - 44 U/L 19  17  17      Lab Results  Component Value Date   MPROTEIN 0.2 (H) 07/28/2022   MPROTEIN 0.2 (H) 07/06/2022   MPROTEIN 0.2 (H) 06/30/2022   Lab Results  Component Value Date   KPAFRELGTCHN 11.9 07/28/2022   KPAFRELGTCHN 5.9 07/06/2022   KPAFRELGTCHN 11.8 06/30/2022   LAMBDASER 9.2 07/28/2022   LAMBDASER 5.3 (L) 07/06/2022   LAMBDASER 9.9 06/30/2022   KAPLAMBRATIO 1.29 07/28/2022   KAPLAMBRATIO 1.11 07/06/2022   KAPLAMBRATIO 1.19 06/30/2022    RADIOGRAPHIC STUDIES: No results found.  ASSESSMENT & PLAN HONG MORING 67 y.o. male with medical history significant for  IgG Lambda Multiple Myeloma who presents for a follow up visit.  After review of the labs, discussion with the patient, and reviewed the imaging his findings are most consistent with a relapsed multiple myeloma.The patient has had excellent success before with treatment of his myeloma with Velcade, Revlimid, and dexamethasone.  Transition to daratumumab therapy and was  on a once monthly treatments but unfortunately developed progression of disease.  We have transitioned to Kyprolis/Dex.  # IgG Lambda Multiple Myeloma, Relapsed (ISS Stage II) --findings are most consistent with relapsed multiple myeloma. Patient previously successfully treated with Velcade/Rev/Dex and Daratumumab/Velcade/Dex. On 07/02/2020 he transitioned to monthly daratumumab alone.  --due to to rise in M protein, switched to Kyprolis and Dexamethasone on 01/06/2022 Plan: --labs today were reviewed and adequate for treatment.  --Labs show white blood cell count 7.7, 12.5, MCV 83.7, and platelets of 196.  Myeloma labs from 07/28/2022 showed M protein dropped to 0.2 with normalization of kappa lambda ratio to 1.29 --patient will proceed with Cycle 8 Day 1 Kyprolis today.  --return for weekly Kyprolis and clinic visit  in 2 weeks.   #History of DVT --He had placement of IVC filter, remains on Xarelto. --Due to poor mobility, and lack of bleeding complications, I recommend he remain on Xarelto indefinitely. --caution if Plt count were to drop <50  #Snoring/Risk for OSA: --Made referral to sleep center to test for OSA.   # Supportive Care --  provided patient with an albuterol inhaler (for use with daratumumab) --acyclovir 400mg  BID for VZV prophylaxis --zofran 8mg  q8H PRN for nausea/vomiting  --Zometa is being held in the setting of his prior episode of osteonecrosis of the jaw  No orders of the defined types were placed in this encounter.   All questions were answered. The patient knows to call the clinic with any problems, questions or concerns.  I have spent a total of 30 minutes minutes of face-to-face and non-face-to-face time, preparing to see the patient,  performing a medically appropriate examination, counseling and educating the patient, communicating with other health care professionals, documenting clinical information in the electronic health record,  and care coordination.    Ledell Peoples, MD Department of Hematology/Oncology [place] [facility] Phone: [phone] Pager: [phone] Email: [email or web address]   08/07/2022 2:48 PM   Literature Support:  Darrin Nipper, Petrucci MT, [place], [place], [place], [place], Spada S, [place], Ponticelli E, [place], Cavo M, Di Toritto TC, Shela Nevin F, Montefusco V, Palumbo A, Boccadoro M, Larocca A. Once-weekly versus twice-weekly carfilzomib in patients with newly diagnosed multiple myeloma: a pooled analysis of two phase I/II studies. Haematologica. 2019 Aug;104(8):1640-1647.  --Once-weekly 70 mg/m2 carfilzomib as induction and maintenance therapy for newly diagnosed multiple myeloma patients was as safe and effective as twice-weekly 36 mg/m2 carfilzomib and provided a more convenient schedule.

## 2022-08-08 ENCOUNTER — Other Ambulatory Visit: Payer: Self-pay

## 2022-08-11 ENCOUNTER — Inpatient Hospital Stay (HOSPITAL_BASED_OUTPATIENT_CLINIC_OR_DEPARTMENT_OTHER): Payer: Medicare (Managed Care) | Admitting: Hematology and Oncology

## 2022-08-11 ENCOUNTER — Ambulatory Visit: Payer: Medicare (Managed Care) | Admitting: Hematology and Oncology

## 2022-08-11 ENCOUNTER — Ambulatory Visit: Payer: Medicare (Managed Care)

## 2022-08-11 ENCOUNTER — Ambulatory Visit: Payer: Medicare (Managed Care) | Admitting: Physician Assistant

## 2022-08-11 ENCOUNTER — Other Ambulatory Visit: Payer: Medicare (Managed Care)

## 2022-08-11 ENCOUNTER — Inpatient Hospital Stay: Payer: Medicare (Managed Care)

## 2022-08-11 ENCOUNTER — Other Ambulatory Visit: Payer: Self-pay

## 2022-08-11 VITALS — BP 114/70 | HR 99 | Temp 98.0°F | Resp 16 | Wt 275.0 lb

## 2022-08-11 DIAGNOSIS — C9002 Multiple myeloma in relapse: Secondary | ICD-10-CM

## 2022-08-11 DIAGNOSIS — Z5111 Encounter for antineoplastic chemotherapy: Secondary | ICD-10-CM | POA: Diagnosis not present

## 2022-08-11 LAB — CMP (CANCER CENTER ONLY)
ALT: 28 U/L (ref 0–44)
AST: 21 U/L (ref 15–41)
Albumin: 3.4 g/dL — ABNORMAL LOW (ref 3.5–5.0)
Alkaline Phosphatase: 77 U/L (ref 38–126)
Anion gap: 6 (ref 5–15)
BUN: 19 mg/dL (ref 8–23)
CO2: 25 mmol/L (ref 22–32)
Calcium: 9.2 mg/dL (ref 8.9–10.3)
Chloride: 109 mmol/L (ref 98–111)
Creatinine: 0.85 mg/dL (ref 0.61–1.24)
GFR, Estimated: >60 mL/min (ref >60)
Glucose, Bld: 141 mg/dL — ABNORMAL HIGH (ref 70–99)
Potassium: 3.6 mmol/L (ref 3.5–5.1)
Sodium: 140 mmol/L (ref 135–145)
Total Bilirubin: 0.8 mg/dL (ref 0.3–1.2)
Total Protein: 5.8 g/dL — ABNORMAL LOW (ref 6.5–8.1)

## 2022-08-11 LAB — CBC WITH DIFFERENTIAL (CANCER CENTER ONLY)
Abs Immature Granulocytes: 0.07 10*3/uL (ref 0.00–0.07)
Basophils Absolute: 0 10*3/uL (ref 0.0–0.1)
Basophils Relative: 1 %
Eosinophils Absolute: 0.4 10*3/uL (ref 0.0–0.5)
Eosinophils Relative: 5 %
HCT: 33 % — ABNORMAL LOW (ref 39.0–52.0)
Hemoglobin: 11.3 g/dL — ABNORMAL LOW (ref 13.0–17.0)
Immature Granulocytes: 1 %
Lymphocytes Relative: 11 %
Lymphs Abs: 0.8 10*3/uL (ref 0.7–4.0)
MCH: 28.4 pg (ref 26.0–34.0)
MCHC: 34.2 g/dL (ref 30.0–36.0)
MCV: 82.9 fL (ref 80.0–100.0)
Monocytes Absolute: 1.5 10*3/uL — ABNORMAL HIGH (ref 0.1–1.0)
Monocytes Relative: 20 %
Neutro Abs: 4.5 10*3/uL (ref 1.7–7.7)
Neutrophils Relative %: 62 %
Platelet Count: 159 10*3/uL (ref 150–400)
RBC: 3.98 MIL/uL — ABNORMAL LOW (ref 4.22–5.81)
RDW: 16.9 % — ABNORMAL HIGH (ref 11.5–15.5)
WBC Count: 7.3 10*3/uL (ref 4.0–10.5)
nRBC: 0 % (ref 0.0–0.2)

## 2022-08-11 LAB — LACTATE DEHYDROGENASE: LDH: 193 U/L — ABNORMAL HIGH (ref 98–192)

## 2022-08-11 LAB — URIC ACID: Uric Acid, Serum: 6.2 mg/dL (ref 3.7–8.6)

## 2022-08-11 MED ORDER — SODIUM CHLORIDE 0.9 % IV SOLN
40.0000 mg | Freq: Once | INTRAVENOUS | Status: AC
  Administered 2022-08-11: 40 mg via INTRAVENOUS
  Filled 2022-08-11: qty 4

## 2022-08-11 MED ORDER — DEXTROSE 5 % IV SOLN
70.0000 mg/m2 | Freq: Once | INTRAVENOUS | Status: AC
  Administered 2022-08-11: 150 mg via INTRAVENOUS
  Filled 2022-08-11: qty 60

## 2022-08-11 MED ORDER — SODIUM CHLORIDE 0.9 % IV SOLN: Freq: Once | INTRAVENOUS | Status: AC

## 2022-08-11 NOTE — Patient Instructions (Signed)
[facility]  Discharge Instructions: Thank you for choosing [facility] to provide your oncology and hematology care.   If you have a lab appointment with the Cancer Center, please go directly to the Cancer Center and check in at the registration area.   Wear comfortable clothing and clothing appropriate for easy access to any Portacath or PICC line.   We strive to give you quality time with your provider. You may need to reschedule your appointment if you arrive late (15 or more minutes).  Arriving late affects you and other patients whose appointments are after yours.  Also, if you miss three or more appointments without notifying the office, you may be dismissed from the clinic at the provider's discretion.      For prescription refill requests, have your pharmacy contact our office and allow 72 hours for refills to be completed.    Today you received the following chemotherapy and/or immunotherapy agents: Kyprolis      To help prevent nausea and vomiting after your treatment, we encourage you to take your nausea medication as directed.  BELOW ARE SYMPTOMS THAT SHOULD BE REPORTED IMMEDIATELY: *FEVER GREATER THAN 100.4 F (38 C) OR HIGHER *CHILLS OR SWEATING *NAUSEA AND VOMITING THAT IS NOT CONTROLLED WITH YOUR NAUSEA MEDICATION *UNUSUAL SHORTNESS OF BREATH *UNUSUAL BRUISING OR BLEEDING *URINARY PROBLEMS (pain or burning when urinating, or frequent urination) *BOWEL PROBLEMS (unusual diarrhea, constipation, pain near the anus) TENDERNESS IN MOUTH AND THROAT WITH OR WITHOUT PRESENCE OF ULCERS (sore throat, sores in mouth, or a toothache) UNUSUAL RASH, SWELLING OR PAIN  UNUSUAL VAGINAL DISCHARGE OR ITCHING   Items with * indicate a potential emergency and should be followed up as soon as possible or go to the Emergency Department if any problems should occur.  Please show the CHEMOTHERAPY ALERT CARD or IMMUNOTHERAPY ALERT CARD at check-in to  the Emergency Department and triage nurse.  Should you have questions after your visit or need to cancel or reschedule your appointment, please contact [facility]  Dept: [phone]  and follow the prompts.  Office hours are 8:00 a.m. to 4:30 p.m. Monday - Friday. Please note that voicemails left after 4:00 p.m. may not be returned until the following business day.  We are closed weekends and major holidays. You have access to a nurse at all times for urgent questions. Please call the main number to the clinic Dept: [phone] and follow the prompts.   For any non-urgent questions, you may also contact your provider using MyChart. We now offer e-Visits for anyone 57 and older to request care online for non-urgent symptoms. For details visit [email or web address].   Also download the MyChart app! Go to the app store, search "MyChart", open the app, select [place], and log in with your MyChart username and password.

## 2022-08-11 NOTE — Progress Notes (Signed)
[facility] [phone]   [phone]  PROGRESS NOTE  Patient Care Team: Jethro Bastos, MD as PCP - General (Family Medicine) Ranelle Oyster, MD as Consulting Physician (Physical Medicine and Rehabilitation) Hildred Alamin Mcarthur Rossetti, PA-C as Physician Assistant (Physical Medicine and Rehabilitation) Jaci Standard, MD as Consulting Physician (Hematology and Oncology)  Hematological/Oncological History # IgG Lambda Multiple Myeloma, Relapsed (ISS Stage II) 1) 06/2010: initial diagnosis of Multiple Myeloma after T8 compression fracture. Treated with Velcade/Revlimid/Dexamethasone and achieved a complete remission 2) Velcade was discontinued in September 2012 and that Revlimid and Decadron were discontinued in March 2013. 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 4) Followed by Dr. Bertis Ruddy, last clinic visit 10/09/2019. At that time there was concern for relapse of his multiple myeloma.  5) Patient requested transfer to different provider after misunderstanding regarding imaging studies 6) 12/17/2019: transfer care to Dr. Leonides Schanz  7) 01/09/2020: Cycle 1 Day 1 of Dara/Velcade/Dex 8) 01/21/2020: presented as urgent visit for diarrhea and dehydration. Holding chemotherapy scheduled for 01/23/2020. 9) 01/30/2020: Resume dara/velcade/dex after resolution of diarrhea.  10) 02/13/2020: restaging labs show M protein 0.8, Kappa 4.5, lamba 17.2, ratio 0.26, urine M protein 53 (7.1%). All MM labs indicate improvement.  11) 03/10/2020: Cycle 4 Day 1 of Dara/Velcade/Dex. Transition to q 3 week daratumumab.  12) 06/04/2020:  Cycle 8 Day 1 of Dara/Velcade/Dex 13) 06/25/2020:  Cycle 9 Day 1 of Dara/Velcade/Dex 14) 07/22/2020: Cycle 10 Day 1 of  Dara//Dex 15) 08/18/2020: Cycle 11 Day 1 of Dara//Dex 16) 09/23/2020: Cycle 12 Day 1 of Dara//Dex 17) 10/22/2020: Cycle 13 Day 1 of Dara/Dex   18) 11/19/2020: Cycle 14 Day 1 of Dara/Dex  19) 12/17/2020: Cycle 15 Day 1 of Dara/Dex  20) 01/17/2021: Cycle 16 Day 1 of Dara/Dex  21) 02/11/2021: Cycle 17 Day 1 of Dara/Dex 22) 03/10/2021: Cycle 18 Day 1 of Dara/Dex  23) 04/08/2021: Cycle 19 Day 1 of Dara/Dex  24) 08/03/2021: Cycle 20 Day 1 of Dara/Dex (delayed due to scheduling error) 25) 09/02/2021: Cycle 21 Day 1 of Dara/Dex 26) 09/30/2021: Cycle 22 Day 1 of Dara/Dex 27) 10/28/2021: Cycle 23 Day 1 of Dara/Dex 28) 12/02/2021: Cycle 24 Day 1 of Dara/Dex 29) 12/30/2021: Cycle 25 Day 1 of Dara/Dex 30) 01/06/2022: Cycle 1 Day 1 of Kyprolis/Dex 31) 02/03/2022: Cycle 2 Day 1 of Kyprolis/Dex 32) 02/24/2022: Cycle 3 Day 1 of Kyprolis/Dex 33) 04/07/2022: Cycle 4 Day 1 of Kyprolis/Dex 34) 05/05/2022: Cycle 5 Day 1 of Kyprolis/Dex 35) 06/01/2022: Cycle 6 Day 1 of Kyprolis/Dex 36) 06/29/2022: Cycle 7 Day 1 of Kyprolis/Dex 37) 07/28/2022: Cycle 8 Day 1 of Kyprolis/Dex  Interval History:  [name] 67 y.o. male with medical history significant for  IgG Lambda Multiple Myeloma who presents for a follow up visit. The patient's last visit was on 06/30/2022.  He presents today to for cycle 8 day 1 of Kyprolis/Dex.   On exam today [name] reports he has been well overall in the interim since her last visit.  Here notes that he did have an issue last week when he did not discontinue his Xarelto and his suprapubic catheter was being switched.  He reports that there was "a lot of blood loss".  He notes that it is subsequently resolved and he feels better.  He notes it was a "bloody exchange".  He is not having any further urine blood or concerning  findings in his urostomy bag.  He reports that he is not having any lightheadedness, dizziness, shortness of breath.  His energy levels are still quite good.  He is tolerating his Kyprolis infusions well without any numbness tingling.  He also denies any fevers, chills, sweats, nausea, vomiting or diarrhea.  A full 10 point  ROS was otherwise negative.   Overall he is willing and able to continue with Kyprolis treatment at this time.   MEDICAL HISTORY:  Past Medical History:  Diagnosis Date   Adrenal insufficiency (HCC)    on chronic dexamethasone   Anemia    Cancer (HCC)    Coagulopathy (HCC)    on xeralto/ s/p DVT while on coumadin,  IVC in place   Diabetes mellitus without complication (HCC)    type 2   Gross hematuria 7/14   post foley cath procedure   History of blood transfusion 7/14   Multiple myeloma    thoracic T8 with paraplegia s/p resection- on chemo at visit 10/13/10   Multiple myeloma    Multiple myeloma without mention of remission    Neurogenic bladder    Neurogenic bowel    Paraplegia (HCC)    Partial small bowel obstruction (HCC) during dec 2011 admission    SURGICAL HISTORY: Past Surgical History:  Procedure Laterality Date   COLONOSCOPY WITH PROPOFOL N/A 04/12/2017   Procedure: COLONOSCOPY WITH PROPOFOL;  Surgeon: Hilarie Fredrickson, MD;  Location: [facility];  Service: Endoscopy;  Laterality: N/A;   COLONOSCOPY WITH PROPOFOL N/A 04/19/2017   Procedure: COLONOSCOPY WITH PROPOFOL;  Surgeon: Benancio Deeds, MD;  Location: [facility];  Service: Gastroenterology;  Laterality: N/A;   COLOSTOMY  07/20/2011   Procedure: COLOSTOMY;  Surgeon: Rulon Abide, DO;  Location: [facility] OR;  Service: General;;   COLOSTOMY REVISION  07/20/2011   Procedure: COLON RESECTION SIGMOID;  Surgeon: Rulon Abide, DO;  Location: [name] OR;  Service: General;;   CYSTOSCOPY N/A 04/04/2013   Procedure: CYSTOSCOPY WITH LITHALOPAXY;  Surgeon: Sebastian Ache, MD;  Location: [facility];  Service: Urology;  Laterality: N/A;   INSERTION OF SUPRAPUBIC CATHETER N/A 04/04/2013   Procedure: INSERTION OF SUPRAPUBIC CATHETER;  Surgeon: Sebastian Ache, MD;  Location: [facility];  Service: Urology;  Laterality: N/A;   LAPAROTOMY  07/20/2011   Procedure: EXPLORATORY LAPAROTOMY;  Surgeon: Rulon Abide, DO;  Location:  [facility] OR;  Service: General;  Laterality: N/A;   myeloma thoracic T8 with parpaplegia s/p thoracotomy and thoracic T7-9 cage placement on Dec 26th 2011  07/18/10    SOCIAL HISTORY: Social History   Socioeconomic History   Marital status: Married    Spouse name: Not on file   Number of children: Not on file   Years of education: Not on file   Highest education level: Not on file  Occupational History   Not on file  Tobacco Use   Smoking status: Never   Smokeless tobacco: Never  Vaping Use   Vaping Use: Never used  Substance and Sexual Activity   Alcohol use: No   Drug use: No   Sexual activity: Never  Other Topics Concern   Not on file  Social History Narrative   Not on file   Social Determinants of Health   Financial Resource Strain: Not on file  Food Insecurity: Not on file  Transportation Needs: Not on file  Physical Activity: Not on file  Stress: Not on file  Social Connections: Not on file  Intimate Partner Violence: Not on file  FAMILY HISTORY: Family History  Problem Relation Age of Onset   Ovarian cancer Mother    Diabetes Father     ALLERGIES:  is allergic to feraheme [ferumoxytol].  MEDICATIONS:  Current Outpatient Medications  Medication Sig Dispense Refill   acyclovir (ZOVIRAX) 400 MG tablet Take 1 tablet (400 mg total) by mouth 2 (two) times daily. 60 tablet 2   atorvastatin (LIPITOR) 10 MG tablet Take 10 mg by mouth daily.     baclofen (LIORESAL) 20 MG tablet Take 20 mg by mouth 2 (two) times daily.     diazepam (DIASTAT ACUDIAL) 10 MG GEL SMARTSIG:By Mouth     dorzolamide (TRUSOPT) 2 % ophthalmic solution 1 drop 2 (two) times daily.     iron polysaccharides (NU-IRON) 150 MG capsule Take 1 capsule (150 mg total) by mouth daily. 30 capsule 0   latanoprost (XALATAN) 0.005 % ophthalmic solution Place 1 drop into both eyes at bedtime.     ondansetron (ZOFRAN) 8 MG tablet Take 1 tablet (8 mg total) by mouth every 8 (eight) hours as needed for nausea  or vomiting. 30 tablet 0   rivaroxaban (XARELTO) 10 MG TABS tablet Take 1 tablet (10 mg total) by mouth daily with supper. 30 tablet 9   Skin Protectants, Misc. (EUCERIN) cream Apply 1 application topically 2 (two) times daily as needed for dry skin.      zinc oxide (BALMEX) 11.3 % CREA cream Apply 1 application topically 2 (two) times daily.     No current facility-administered medications for this visit.   Facility-Administered Medications Ordered in Other Visits  Medication Dose Route Frequency Provider Last Rate Last Admin   sodium chloride flush (NS) 0.9 % injection 10 mL  10 mL Intravenous PRN Bertis Ruddy, Ni, MD        REVIEW OF SYSTEMS:   Constitutional: ( - ) fevers, ( - )  chills , ( - ) night sweats Eyes: ( - ) blurriness of vision, ( - ) double vision, ( - ) watery eyes Ears, nose, mouth, throat, and face: ( - ) mucositis, ( - ) sore throat Respiratory: ( - ) cough, ( - ) dyspnea, ( - ) wheezes Cardiovascular: ( - ) palpitation, ( - ) chest discomfort, ( - ) lower extremity swelling Gastrointestinal:  ( - ) nausea, ( - ) heartburn, ( - ) change in bowel habits Skin: ( - ) abnormal skin rashes Lymphatics: ( - ) new lymphadenopathy, ( - ) easy bruising Neurological: ( - ) numbness, ( - ) tingling, ( - ) new weaknesses Behavioral/Psych: ( - ) mood change, ( - ) new changes  All other systems were reviewed with the patient and are negative.  PHYSICAL EXAMINATION: ECOG PERFORMANCE STATUS: paraplegic.   Vitals:   08/11/22 0848  BP: 114/70  Pulse: 99  Resp: 16  Temp: 98 F (36.7 C)  SpO2: 100%       Filed Weights   08/11/22 0848  Weight: 275 lb (124.7 kg)     GENERAL: well appearing middle aged [place] American male alert, no distress and comfortable SKIN: skin color, texture, turgor are normal, no rashes or significant lesions EYES: conjunctiva are pink and non-injected, sclera clear LUNGS: clear to auscultation and percussion with normal breathing effort HEART:  regular rate & rhythm and no murmurs and no lower extremity edema Musculoskeletal: no cyanosis of digits and no clubbing  PSYCH: alert & oriented x 3, fluent speech NEURO: paraplegic, no use of LE bilaterally.  LABORATORY DATA:  I have reviewed the data as listed    Latest Ref Rng & Units 08/11/2022    8:30 AM 08/04/2022   12:12 PM 07/28/2022   10:23 AM  CBC  WBC 4.0 - 10.5 K/uL 7.3  7.7  7.7   Hemoglobin 13.0 - 17.0 g/dL 11.3  12.5  10.8   Hematocrit 39.0 - 52.0 % 33.0  36.4  30.9   Platelets 150 - 400 K/uL 159  196  427        Latest Ref Rng & Units 08/11/2022    8:30 AM 08/04/2022   12:12 PM 07/28/2022   10:23 AM  CMP  Glucose 70 - 99 mg/dL 141  100  178   BUN 8 - 23 mg/dL 19  17  8    Creatinine 0.61 - 1.24 mg/dL 0.85  0.79  0.74   Sodium 135 - 145 mmol/L 140  140  142   Potassium 3.5 - 5.1 mmol/L 3.6  3.6  3.9   Chloride 98 - 111 mmol/L 109  106  109   CO2 22 - 32 mmol/L 25  28  27    Calcium 8.9 - 10.3 mg/dL 9.2  9.8  9.1   Total Protein 6.5 - 8.1 g/dL 5.8  6.8  5.9   Total Bilirubin 0.3 - 1.2 mg/dL 0.8  0.9  0.6   Alkaline Phos 38 - 126 U/L 77  88  82   AST 15 - 41 U/L 21  14  12    ALT 0 - 44 U/L 28  19  17      Lab Results  Component Value Date   MPROTEIN 0.2 (H) 07/28/2022   MPROTEIN 0.2 (H) 07/06/2022   MPROTEIN 0.2 (H) 06/30/2022   Lab Results  Component Value Date   KPAFRELGTCHN 11.9 07/28/2022   KPAFRELGTCHN 5.9 07/06/2022   KPAFRELGTCHN 11.8 06/30/2022   LAMBDASER 9.2 07/28/2022   LAMBDASER 5.3 (L) 07/06/2022   LAMBDASER 9.9 06/30/2022   KAPLAMBRATIO 1.29 07/28/2022   KAPLAMBRATIO 1.11 07/06/2022   KAPLAMBRATIO 1.19 06/30/2022    RADIOGRAPHIC STUDIES: No results found.  ASSESSMENT & PLAN [name] 67 y.o. male with medical history significant for  IgG Lambda Multiple Myeloma who presents for a follow up visit.  After review of the labs, discussion with the patient, and reviewed the imaging his findings are most consistent with a relapsed  multiple myeloma.The patient has had excellent success before with treatment of his myeloma with Velcade, Revlimid, and dexamethasone.  Transition to daratumumab therapy and was on a once monthly treatments but unfortunately developed progression of disease.  We have transitioned to Kyprolis/Dex.  # IgG Lambda Multiple Myeloma, Relapsed (ISS Stage II) --findings are most consistent with relapsed multiple myeloma. Patient previously successfully treated with Velcade/Rev/Dex and Daratumumab/Velcade/Dex. On 07/02/2020 he transitioned to monthly daratumumab alone.  --due to to rise in M protein, switched to Kyprolis and Dexamethasone on 01/06/2022 Plan: --labs today were reviewed and adequate for treatment.  --Labs show white blood cell count 7.3, hemoglobin 11.3, MCV 82.9, and platelets of 159.  Myeloma labs from 07/28/2022 showed M protein dropped to 0.2 with normalization of kappa lambda ratio to 1.29 --patient will proceed with Cycle 8 Day 15 Kyprolis today.  --return for weekly Kyprolis and clinic visit  in 2 weeks.   #History of DVT --He had placement of IVC filter, remains on Xarelto. --Due to poor mobility, and lack of bleeding complications, I recommend he remain on Xarelto indefinitely. --caution if  Plt count were to drop <50  #Snoring/Risk for OSA: --Made referral to sleep center to test for OSA.   # Supportive Care -- provided patient with an albuterol inhaler (for use with daratumumab) --acyclovir 400mg  BID for VZV prophylaxis --zofran 8mg  q8H PRN for nausea/vomiting  --Zometa is being held in the setting of his prior episode of osteonecrosis of the jaw  Orders Placed This Encounter  Procedures   Multiple Myeloma Panel (SPEP&IFE w/QIG)    Standing Status:   Future    Standing Expiration Date:   11/17/2023   Kappa/lambda light chains    Standing Status:   Future    Standing Expiration Date:   11/17/2023   CBC with Differential (Cancer Center Only)    Standing Status:   Future     Standing Expiration Date:   11/18/2023   CMP (Cancer Center only)    Standing Status:   Future    Standing Expiration Date:   11/18/2023   CBC with Differential (Cancer Center Only)    Standing Status:   Future    Standing Expiration Date:   11/25/2023   CMP (Cancer Center only)    Standing Status:   Future    Standing Expiration Date:   11/25/2023   CBC with Differential (Cancer Center Only)    Standing Status:   Future    Standing Expiration Date:   12/02/2023   CMP (Cancer Center only)    Standing Status:   Future    Standing Expiration Date:   12/02/2023    All questions were answered. The patient knows to call the clinic with any problems, questions or concerns.  I have spent a total of 30 minutes minutes of face-to-face and non-face-to-face time, preparing to see the patient,  performing a medically appropriate examination, counseling and educating the patient, communicating with other health care professionals, documenting clinical information in the electronic health record,  and care coordination.   02/01/2024, MD Department of Hematology/Oncology [facility] at [facility] Phone: [phone] Pager: [phone] Email: [phone]   08/11/2022 11:07 AM   Literature Support:  Jonny Ruiz, Petrucci [place], [place], [place], [place], [place], Spada S, [place], Ponticelli E, [place], Cavo M, Di Toritto TC, 07-20-2004 F, Monte[place] V, Palumbo A, Boccadoro M, Larocca A. Once-weekly versus twice-weekly carfilzomib in patients with newly diagnosed multiple myeloma: a pooled analysis of two phase I/II studies. Haematologica. 2019 Aug;104(8):1640-1647.  --Once-weekly 70 mg/m2 carfilzomib as induction and maintenance therapy for newly diagnosed multiple myeloma patients was as safe and effective as twice-weekly 36 mg/m2 carfilzomib and provided a more convenient schedule.

## 2022-08-14 ENCOUNTER — Ambulatory Visit (INDEPENDENT_AMBULATORY_CARE_PROVIDER_SITE_OTHER): Payer: Medicare (Managed Care) | Admitting: Orthopedic Surgery

## 2022-08-14 ENCOUNTER — Ambulatory Visit (INDEPENDENT_AMBULATORY_CARE_PROVIDER_SITE_OTHER): Payer: Medicare (Managed Care)

## 2022-08-14 DIAGNOSIS — M899 Disorder of bone, unspecified: Secondary | ICD-10-CM | POA: Diagnosis not present

## 2022-08-15 ENCOUNTER — Other Ambulatory Visit: Payer: Self-pay

## 2022-08-16 IMAGING — DX DG BONE SURVEY MET
9 of 10 series · 9 of 10 positions shown · non-contrast
Comparison: Bone survey 10/02/2019., CT of the femur 11/28/2019
reviewed

CLINICAL DATA: Multiple myeloma in relapse. Assess for worsening
lytic lesions.

EXAM:
METASTATIC BONE SURVEY

[skull lat]
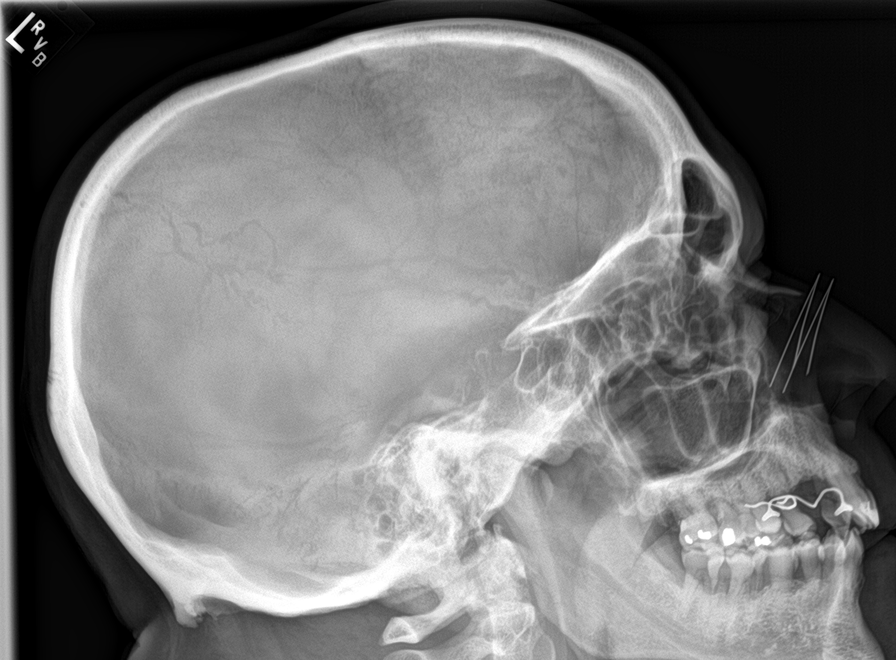

[shoulder ap (1 of 2)]
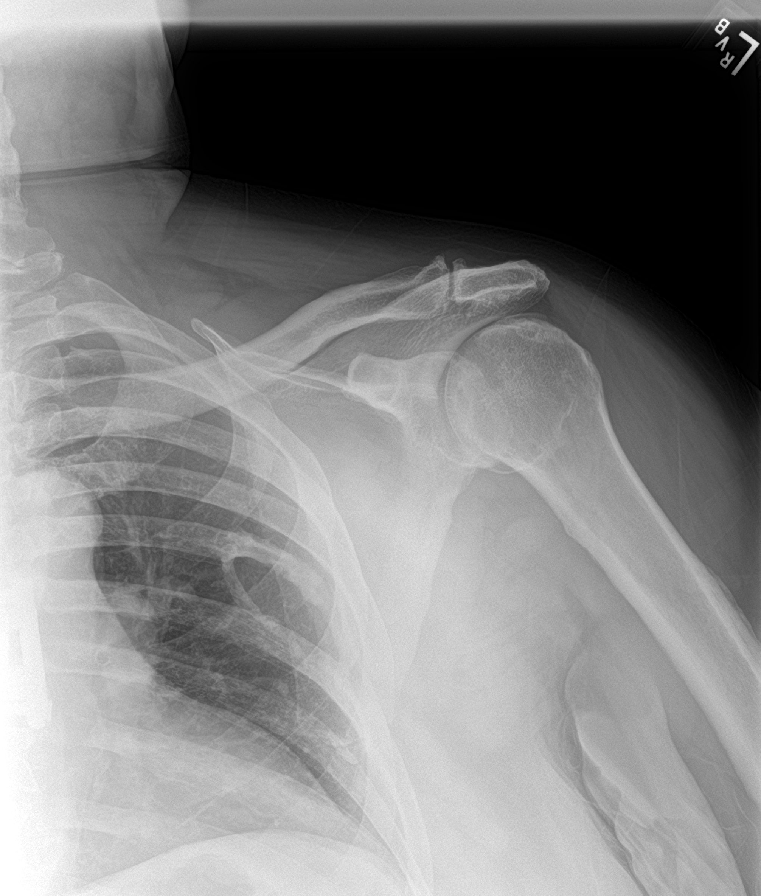

[shoulder ap (2 of 2)]
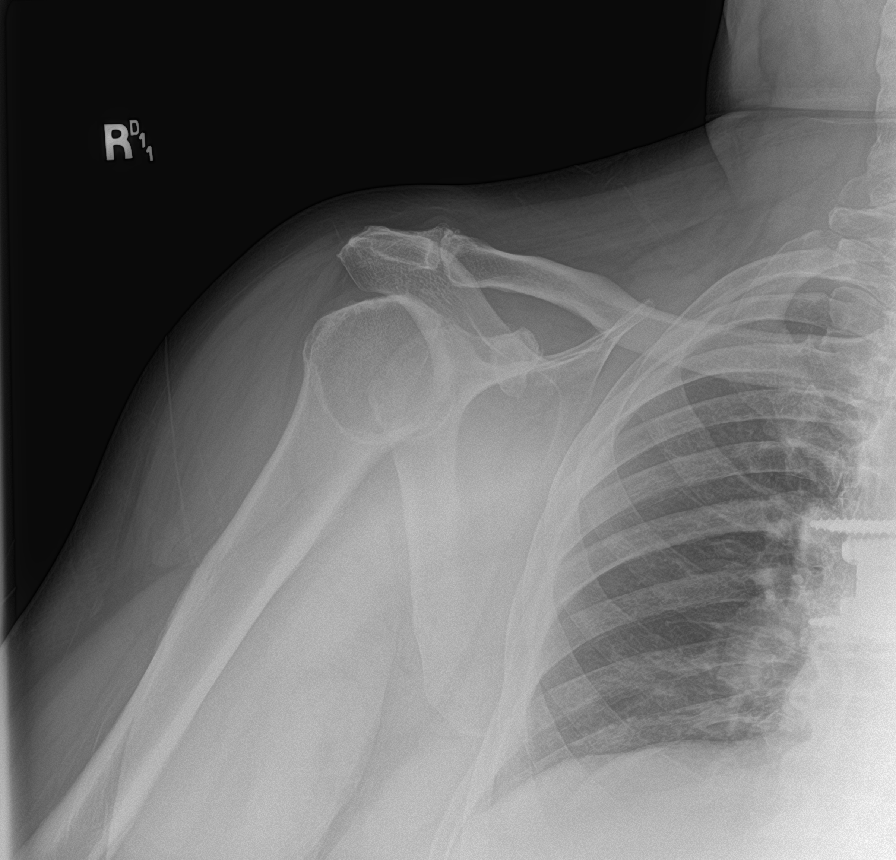

[humerus ap (1 of 2)]
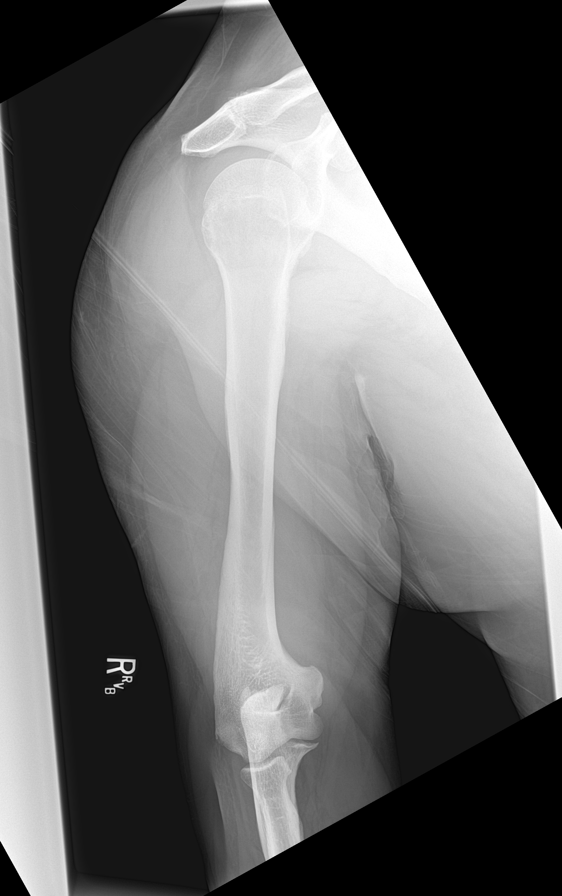

[humerus ap (2 of 2)]
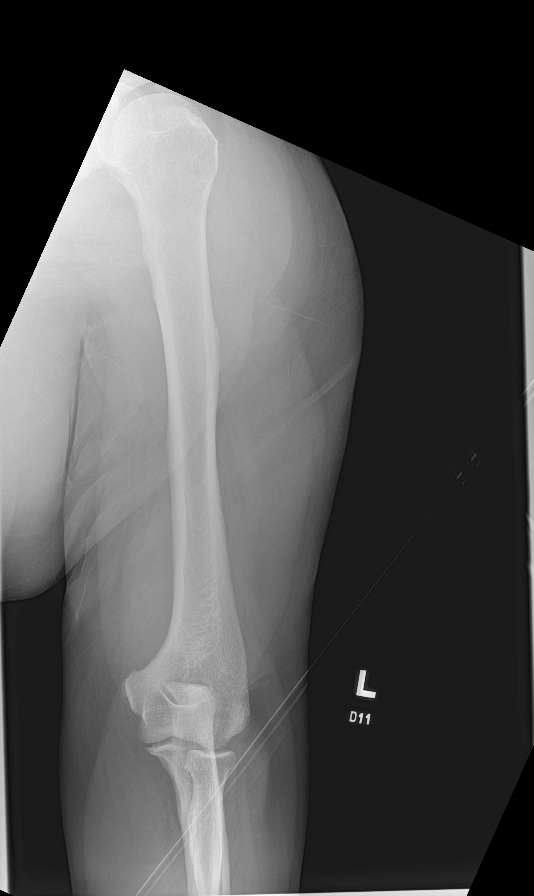

[forearm ap (1 of 2)]
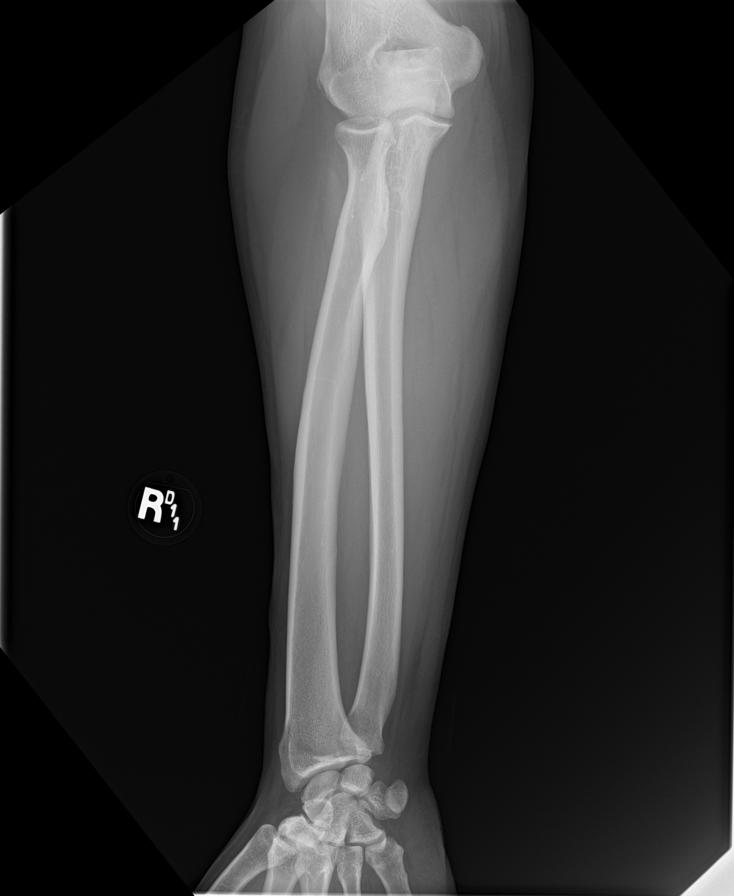

[forearm ap (2 of 2)]
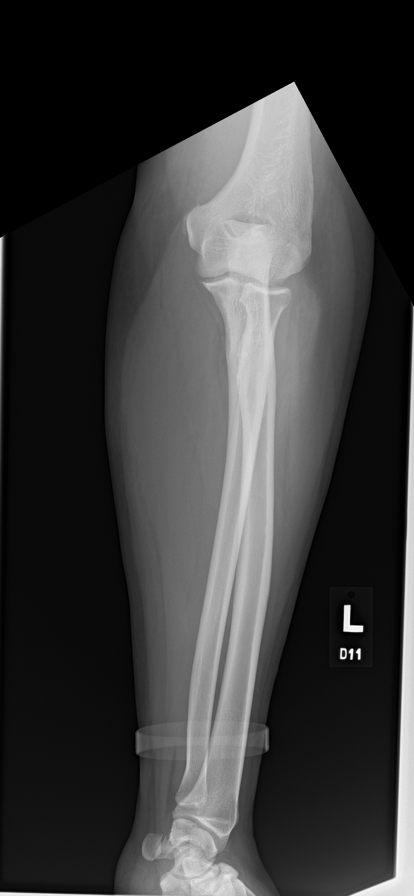

[c-spine ap]
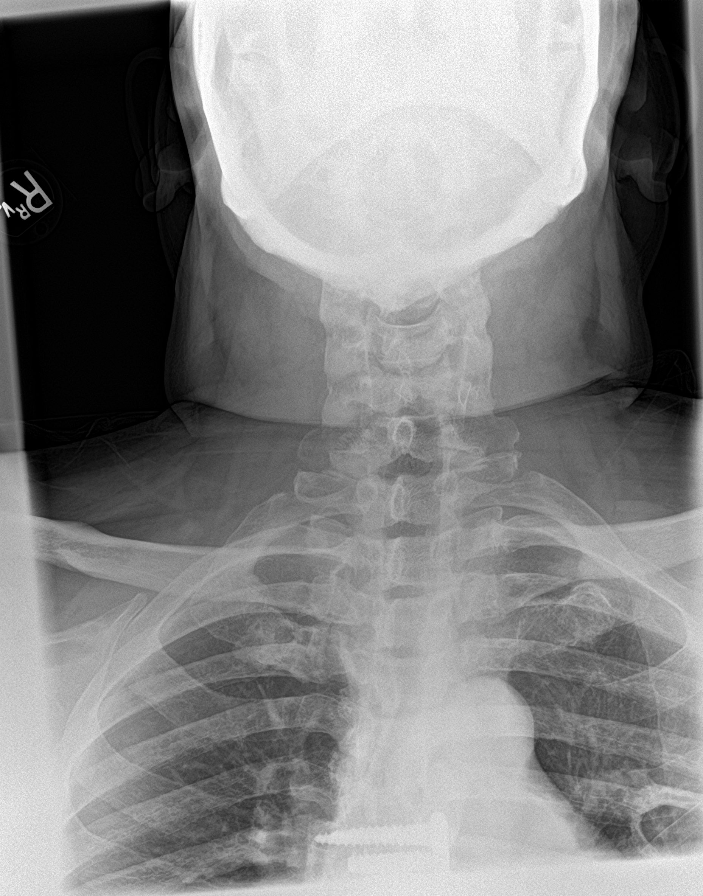

[c-spine lat]
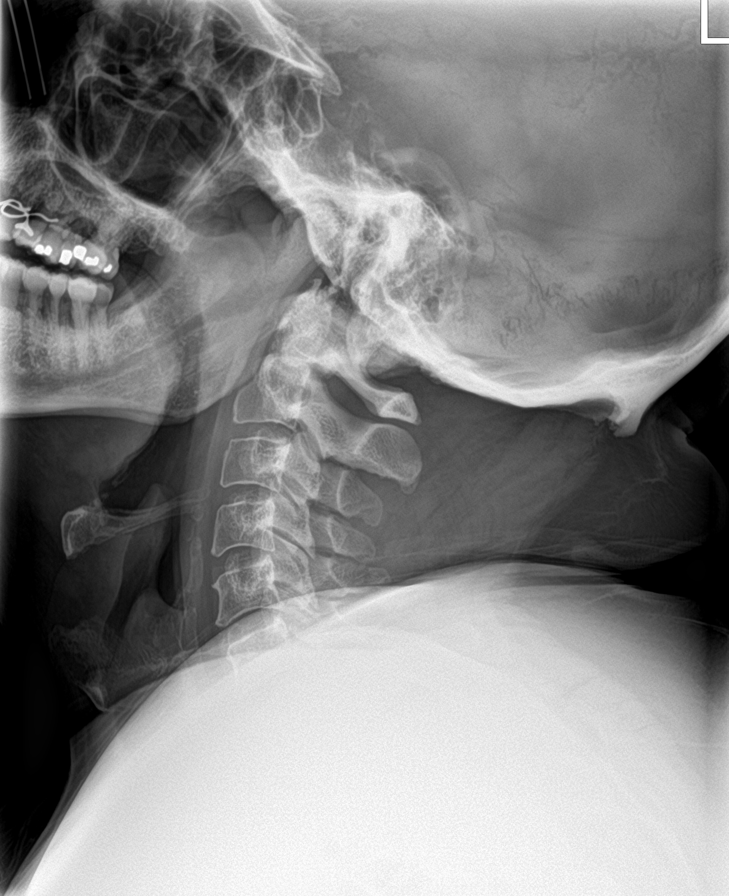

[9 of 10 positions shown; findings below may reference images not displayed]

FINDINGS: Two adjacent lytic lesions in the right proximal femoral diaphysis
are similar or slightly diminished in size, superior lesion spanning
5.5 cm and inferior lesion spanning 3.6 cm, previously 5.3 and
cm respectively. There is associated endosteal scalloping but no
pathologic fracture or periosteal reaction. Lytic lesion in the left
mid femoral diaphysis spans 2.9 cm, unchanged by my retrospective
measurement. There is mild cortical thickening and smooth periosteal
reaction but no pathologic fracture. Stable appearance of expansile
left sixth rib lesion. Lateral fusion and corpectomy at T7-T8.
Similar slight T12 compression deformity.

No definite new lytic lesions. There is heterotopic calcification
about both hips. Enthesopathic change at the ischial tuberosities.
5.4 cm ovoid density with adjacent triangular densities correspond
to bladder stones on prior CT. A suprapubic catheter is faintly
visualized. Diffuse degenerative change in the spine.
IMPRESSION: 1. Lytic lesions in the right proximal femoral diaphysis and left
mid femoral diaphysis are stable from prior exam. Stable expansile
left sixth rib lesion.
2. No evidence of new lytic lesion.
3. Large bladder stones, as seen on prior CT.

## 2022-08-22 ENCOUNTER — Encounter: Payer: Self-pay | Admitting: Orthopedic Surgery

## 2022-08-22 NOTE — Progress Notes (Signed)
Office Visit Note   Patient: [name]           Date of Birth: Mar 24, 1956           MRN: [id number] Visit Date: 08/14/2022              Requested by: [name], MD No address on file PCP: [name], MD (Inactive)  Chief Complaint  Patient presents with   Left Hip - Follow-up   Right Hip - Follow-up      HPI: Patient is a 67 year old gentleman who is status post treatment for multiple myeloma with lytic lesions of both tumors.  Patient has been asymptomatic and presents for serial follow-up.  Assessment & Plan: Visit Diagnoses:  1. Lytic bone lesion of left femur   2. Lytic bone lesion of right femur     Plan: The lesions are essentially unchanged.  Patient states he would like to follow-up as needed.  He will follow-up if he develops any symptoms.  Follow-Up Instructions: No follow-ups on file.   Ortho Exam  Patient is alert, oriented, no adenopathy, well-dressed, normal affect, normal respiratory effort. Examination patient presents in follow-up for lytic lesions proximal femurs bilaterally status post metastatic multiple myeloma and treatment for the this.  Patient denies any symptoms.  Imaging: No results found. No images are attached to the encounter.  Labs: Lab Results  Component Value Date   HGBA1C 6.0 (H) 07/22/2014   ESRSEDRATE 73 (H) 10/21/2013   ESRSEDRATE 55 (H) 07/23/2013   ESRSEDRATE 95 (H) 07/13/2010   CRP 1.0 (H) 07/14/2010   LABURIC 6.2 08/11/2022   LABURIC 5.8 08/04/2022   LABURIC 5.0 07/14/2022   REPTSTATUS 01/25/2020 FINAL 01/21/2020   CULT (A) 01/21/2020    >=100,000 COLONIES/mL PROTEUS MIRABILIS 20,000 COLONIES/mL STAPHYLOCOCCUS AUREUS    LABORGA PROTEUS MIRABILIS (A) 01/21/2020   LABORGA STAPHYLOCOCCUS AUREUS (A) 01/21/2020     Lab Results  Component Value Date   ALBUMIN 3.4 (L) 08/11/2022   ALBUMIN 3.8 08/04/2022   ALBUMIN 3.7 07/28/2022    Lab Results  Component Value Date   MG 1.8 01/21/2020    MG 1.5 07/21/2014   MG 1.3 (L) 02/13/2013   Lab Results  Component Value Date   VD25OH 59 04/08/2013   VD25OH 58 01/16/2013   VD25OH 63 11/15/2012    No results found for: "PREALBUMIN"    Latest Ref Rng & Units 08/11/2022    8:30 AM 08/04/2022   12:12 PM 07/28/2022   10:23 AM  CBC EXTENDED  WBC 4.0 - 10.5 K/uL 7.3  7.7  7.7   RBC 4.22 - 5.81 MIL/uL 3.98  4.35  3.67   Hemoglobin 13.0 - 17.0 g/dL 11.3  12.5  10.8   HCT 39.0 - 52.0 % 33.0  36.4  30.9   Platelets 150 - 400 K/uL 159  196  427   NEUT# 1.7 - 7.7 K/uL 4.5  4.8  5.6   Lymph# 0.7 - 4.0 K/uL 0.8  1.1  0.9      There is no height or weight on file to calculate BMI.  Orders:  Orders Placed This Encounter  Procedures   XR HIP UNILAT W OR W/O PELVIS 2-3 VIEWS LEFT   XR HIP UNILAT W OR W/O PELVIS 1V RIGHT   No orders of the defined types were placed in this encounter.    Procedures: No procedures performed  Clinical Data: No additional findings.  ROS:  All other systems  negative, except as noted in the HPI. Review of Systems  Objective: Vital Signs: There were no vitals taken for this visit.  Specialty Comments:  No specialty comments available.  PMFS History: Patient Active Problem List   Diagnosis Date Noted   Goals of care, counseling/discussion 10/09/2019   On rivaroxaban therapy    Status post colonoscopy with polypectomy    Lower GI bleeding 04/18/2017   Acute blood loss anemia 04/18/2017   Diabetes mellitus type 2 in obese ([place]) 04/18/2017   Colon cancer screening    Benign neoplasm of ascending colon    History of DVT (deep vein thrombosis) 06/10/2015   Obstructed suprapubic catheter ([place]) 07/21/2014   Bilateral hydronephrosis 07/21/2014   Acute kidney failure ([place]) 07/20/2014   Abdominal pain 07/20/2014   AKI (acute kidney injury) ([place]) 07/20/2014   Hematuria 02/12/2013   Shock ([place]) 02/12/2013   Adrenal insufficiency ([place]) 02/12/2013   Hypoalbuminemia 11/15/2012   Anemia,  unspecified 09/18/2012   Unspecified vitamin D deficiency 09/18/2012   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   Iron deficiency anemia 09/18/2012   Diverticulitis of large intestine with perforation 07/26/2011   Multiple myeloma in relapse ([place]) 10/14/2010   Neurogenic bowel 10/14/2010   Paraplegia ([place]) 07/13/2010   Neurogenic bladder 07/11/2010   BACK PAIN, LUMBAR, WITH RADICULOPATHY 07/07/2010   DISEQUILIBRIUM 07/07/2010   Abdominal pain, generalized 07/07/2010   OBESITY, NOS 09/20/2006   Past Medical History:  Diagnosis Date   Adrenal insufficiency (HCC)    on chronic dexamethasone   Anemia    Cancer (HCC)    Coagulopathy (HCC)    on xeralto/ s/p DVT while on coumadin,  IVC in place   Diabetes mellitus without complication ([place])    type 2   Gross hematuria 7/14   post foley cath procedure   History of blood transfusion 7/14   Multiple myeloma    thoracic T8 with paraplegia s/p resection- on chemo at visit 10/13/10   Multiple myeloma    Multiple myeloma without mention of remission    Neurogenic bladder    Neurogenic bowel    Paraplegia (HCC)    Partial small bowel obstruction ([place]) during dec 2011 admission    Family History  Problem Relation Age of Onset   Ovarian cancer Mother    Diabetes Father     Past Surgical History:  Procedure Laterality Date   COLONOSCOPY WITH PROPOFOL N/A 04/12/2017   Procedure: COLONOSCOPY WITH PROPOFOL;  Surgeon: Irene Shipper, MD;  [facility];  Service: Endoscopy;  Laterality: N/A;   COLONOSCOPY WITH PROPOFOL N/A 04/19/2017   Procedure: COLONOSCOPY WITH PROPOFOL;  Surgeon: Yetta Flock, MD;  [facility];  Service: Gastroenterology;  Laterality: N/A;   COLOSTOMY  07/20/2011   Procedure: COLOSTOMY;  Surgeon: Judieth Keens, DO;  Location: [place];  Service: General;;   COLOSTOMY REVISION  07/20/2011   Procedure: COLON RESECTION SIGMOID;  Surgeon: Judieth Keens, DO;   Location: [place];  Service: General;;   CYSTOSCOPY N/A 04/04/2013   Procedure: CYSTOSCOPY WITH LITHALOPAXY;  Surgeon: Alexis Frock, MD;  [facility];  Service: Urology;  Laterality: N/A;   INSERTION OF SUPRAPUBIC CATHETER N/A 04/04/2013   Procedure: INSERTION OF SUPRAPUBIC CATHETER;  Surgeon: Alexis Frock, MD;  [facility];  Service: Urology;  Laterality: N/A;   LAPAROTOMY  07/20/2011   Procedure: EXPLORATORY LAPAROTOMY;  Surgeon: Judieth Keens, DO;  Location: [place];  Service: General;  Laterality: N/A;  myeloma thoracic T8 with parpaplegia s/p thoracotomy and thoracic T7-9 cage placement on Dec 26th 2011  07/18/10   Social History   Occupational History   Not on file  Tobacco Use   Smoking status: Never   Smokeless tobacco: Never  Vaping Use   Vaping Use: Never used  Substance and Sexual Activity   Alcohol use: No   Drug use: No   Sexual activity: Never

## 2022-08-25 ENCOUNTER — Inpatient Hospital Stay (HOSPITAL_BASED_OUTPATIENT_CLINIC_OR_DEPARTMENT_OTHER): Payer: Medicare (Managed Care) | Attending: Physician Assistant | Admitting: Physician Assistant

## 2022-08-25 ENCOUNTER — Inpatient Hospital Stay: Payer: Medicare (Managed Care) | Attending: Physician Assistant

## 2022-08-25 ENCOUNTER — Other Ambulatory Visit: Payer: Self-pay

## 2022-08-25 ENCOUNTER — Telehealth: Payer: Self-pay

## 2022-08-25 VITALS — BP 141/79 | HR 90 | Temp 97.9°F | Resp 16 | Wt 260.7 lb

## 2022-08-25 VITALS — BP 137/78 | HR 88 | Resp 17

## 2022-08-25 DIAGNOSIS — E119 Type 2 diabetes mellitus without complications: Secondary | ICD-10-CM | POA: Diagnosis not present

## 2022-08-25 DIAGNOSIS — Z7901 Long term (current) use of anticoagulants: Secondary | ICD-10-CM | POA: Diagnosis not present

## 2022-08-25 DIAGNOSIS — R400 Somnolence: Secondary | ICD-10-CM

## 2022-08-25 DIAGNOSIS — C9002 Multiple myeloma in relapse: Secondary | ICD-10-CM | POA: Insufficient documentation

## 2022-08-25 DIAGNOSIS — Z5111 Encounter for antineoplastic chemotherapy: Secondary | ICD-10-CM | POA: Insufficient documentation

## 2022-08-25 DIAGNOSIS — Z86718 Personal history of other venous thrombosis and embolism: Secondary | ICD-10-CM | POA: Insufficient documentation

## 2022-08-25 LAB — CBC WITH DIFFERENTIAL (CANCER CENTER ONLY)
Abs Immature Granulocytes: 0.03 10*3/uL (ref 0.00–0.07)
Basophils Absolute: 0 10*3/uL (ref 0.0–0.1)
Basophils Relative: 1 %
Eosinophils Absolute: 0.5 10*3/uL (ref 0.0–0.5)
Eosinophils Relative: 7 %
HCT: 32.2 % — ABNORMAL LOW (ref 39.0–52.0)
Hemoglobin: 10.9 g/dL — ABNORMAL LOW (ref 13.0–17.0)
Immature Granulocytes: 1 %
Lymphocytes Relative: 13 %
Lymphs Abs: 0.9 10*3/uL (ref 0.7–4.0)
MCH: 27.7 pg (ref 26.0–34.0)
MCHC: 33.9 g/dL (ref 30.0–36.0)
MCV: 81.9 fL (ref 80.0–100.0)
Monocytes Absolute: 0.7 10*3/uL (ref 0.1–1.0)
Monocytes Relative: 10 %
Neutro Abs: 4.5 10*3/uL (ref 1.7–7.7)
Neutrophils Relative %: 68 %
Platelet Count: 440 10*3/uL — ABNORMAL HIGH (ref 150–400)
RBC: 3.93 MIL/uL — ABNORMAL LOW (ref 4.22–5.81)
RDW: 17.2 % — ABNORMAL HIGH (ref 11.5–15.5)
WBC Count: 6.6 10*3/uL (ref 4.0–10.5)
nRBC: 0 % (ref 0.0–0.2)

## 2022-08-25 LAB — CMP (CANCER CENTER ONLY)
ALT: 17 U/L (ref 0–44)
AST: 15 U/L (ref 15–41)
Albumin: 3.6 g/dL (ref 3.5–5.0)
Alkaline Phosphatase: 81 U/L (ref 38–126)
Anion gap: 7 (ref 5–15)
BUN: 9 mg/dL (ref 8–23)
CO2: 26 mmol/L (ref 22–32)
Calcium: 9.4 mg/dL (ref 8.9–10.3)
Chloride: 112 mmol/L — ABNORMAL HIGH (ref 98–111)
Creatinine: 0.7 mg/dL (ref 0.61–1.24)
GFR, Estimated: >60 mL/min (ref >60)
Glucose, Bld: 131 mg/dL — ABNORMAL HIGH (ref 70–99)
Potassium: 3.5 mmol/L (ref 3.5–5.1)
Sodium: 145 mmol/L (ref 135–145)
Total Bilirubin: 0.7 mg/dL (ref 0.3–1.2)
Total Protein: 6.1 g/dL — ABNORMAL LOW (ref 6.5–8.1)

## 2022-08-25 MED ORDER — SODIUM CHLORIDE 0.9 % IV SOLN: Freq: Once | INTRAVENOUS | Status: AC

## 2022-08-25 MED ORDER — DEXTROSE 5 % IV SOLN
70.0000 mg/m2 | Freq: Once | INTRAVENOUS | Status: AC
  Administered 2022-08-25: 150 mg via INTRAVENOUS
  Filled 2022-08-25: qty 60

## 2022-08-25 MED ORDER — SODIUM CHLORIDE 0.9 % IV SOLN
40.0000 mg | Freq: Once | INTRAVENOUS | Status: AC
  Administered 2022-08-25: 40 mg via INTRAVENOUS
  Filled 2022-08-25: qty 4

## 2022-08-25 NOTE — Telephone Encounter (Addendum)
Per Norlene Campbell request, called [facility] to request latest medical records. This RN left a VM message on Latoya Johson RN's VM requesting a call back requesting pertinent medical information. VM message states that she will return call within one business day.  Received call back from RN at [facility]. Patient had EKG after home health nurse thought that she heard an abnormal rhythm,. EKG showed no concerning findings.  Called and spoke with patient's wife to relay this information.

## 2022-08-25 NOTE — Patient Instructions (Signed)
[place]   Discharge Instructions: Thank you for choosing [facility] to provide your oncology and hematology care.   If you have a lab appointment with the [facility], please go directly to the [facility] and check in at the registration area.   Wear comfortable clothing and clothing appropriate for easy access to any Portacath or PICC line.   We strive to give you quality time with your provider. You may need to reschedule your appointment if you arrive late (15 or more minutes).  Arriving late affects you and other patients whose appointments are after yours.  Also, if you miss three or more appointments without notifying the office, you may be dismissed from the clinic at the provider's discretion.      For prescription refill requests, have your pharmacy contact our office and allow 72 hours for refills to be completed.    Today you received the following chemotherapy and/or immunotherapy agents: Carfilzomib (Kyprolis)       To help prevent nausea and vomiting after your treatment, we encourage you to take your nausea medication as directed.  BELOW ARE SYMPTOMS THAT SHOULD BE REPORTED IMMEDIATELY: *FEVER GREATER THAN 100.4 F (38 C) OR HIGHER *CHILLS OR SWEATING *NAUSEA AND VOMITING THAT IS NOT CONTROLLED WITH YOUR NAUSEA MEDICATION *UNUSUAL SHORTNESS OF BREATH *UNUSUAL BRUISING OR BLEEDING *URINARY PROBLEMS (pain or burning when urinating, or frequent urination) *BOWEL PROBLEMS (unusual diarrhea, constipation, pain near the anus) TENDERNESS IN MOUTH AND THROAT WITH OR WITHOUT PRESENCE OF ULCERS (sore throat, sores in mouth, or a toothache) UNUSUAL RASH, SWELLING OR PAIN  UNUSUAL VAGINAL DISCHARGE OR ITCHING   Items with * indicate a potential emergency and should be followed up as soon as possible or go to the Emergency Department if any problems should occur.  Please show the CHEMOTHERAPY ALERT CARD or IMMUNOTHERAPY  ALERT CARD at check-in to the Emergency Department and triage nurse.  Should you have questions after your visit or need to cancel or reschedule your appointment, please contact [facility]  Dept: [phone]  and follow the prompts.  Office hours are 8:00 a.m. to 4:30 p.m. Monday - Friday. Please note that voicemails left after 4:00 p.m. may not be returned until the following business day.  We are closed weekends and major holidays. You have access to a nurse at all times for urgent questions. Please call the main number to the clinic Dept: [phone] and follow the prompts.   For any non-urgent questions, you may also contact your provider using MyChart. We now offer e-Visits for anyone 19 and older to request care online for non-urgent symptoms. For details visit [email or web address].   Also download the MyChart app! Go to the app store, search "MyChart", open the app, select [facility], and log in with your MyChart username and password.

## 2022-08-26 ENCOUNTER — Encounter: Payer: Self-pay | Admitting: Hematology and Oncology

## 2022-08-26 NOTE — Progress Notes (Signed)
[facility] [phone]   [phone]  PROGRESS NOTE  Patient Care Team: [name], MD (Inactive) as PCP - General (Family Medicine) [name], MD as Consulting Physician (Physical Medicine and Rehabilitation) Donia Guiles Lavon Paganini, PA-C as Physician Assistant (Physical Medicine and Rehabilitation) Orson Slick, MD as Consulting Physician (Hematology and Oncology)  Hematological/Oncological History # IgG Lambda Multiple Myeloma, Relapsed (ISS Stage II) 1) 06/2010: initial diagnosis of Multiple Myeloma after T8 compression fracture. Treated with Velcade/Revlimid/Dexamethasone and achieved a complete remission 2) Velcade was discontinued in September 2012 and that Revlimid and Decadron were discontinued in March 2013. 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 4) Followed by Dr. Alvy Bimler, last clinic visit 10/09/2019. At that time there was concern for relapse of his multiple myeloma.  5) Patient requested transfer to different provider after misunderstanding regarding imaging studies 6) 12/17/2019: transfer care to Dr. Lorenso Courier  7) 01/09/2020: Cycle 1 Day 1 of Dara/Velcade/Dex 8) 01/21/2020: presented as urgent visit for diarrhea and dehydration. Holding chemotherapy scheduled for 01/23/2020. 9) 01/30/2020: Resume dara/velcade/dex after resolution of diarrhea.  10) 02/13/2020: restaging labs show M protein 0.8, Kappa 4.5, lamba 17.2, ratio 0.26, urine M protein 53 (7.1%). All MM labs indicate improvement.  11) 03/10/2020: Cycle 4 Day 1 of Dara/Velcade/Dex. Transition to q 3 week daratumumab.  12) 06/04/2020:  Cycle 8 Day 1 of Dara/Velcade/Dex 13) 06/25/2020:  Cycle 9 Day 1 of Dara/Velcade/Dex 14) 07/22/2020: Cycle 10 Day 1 of  Dara//Dex 15) 08/18/2020: Cycle 11 Day 1 of Dara//Dex 16) 09/23/2020: Cycle 12 Day 1 of Dara//Dex 17) 10/22/2020: Cycle 13 Day 1 of  Dara/Dex  18) 11/19/2020: Cycle 14 Day 1 of Dara/Dex  19) 12/17/2020: Cycle 15 Day 1 of Dara/Dex  20) 01/17/2021: Cycle 16 Day 1 of Dara/Dex  21) 02/11/2021: Cycle 17 Day 1 of Dara/Dex 22) 03/10/2021: Cycle 18 Day 1 of Dara/Dex  23) 04/08/2021: Cycle 19 Day 1 of Dara/Dex  24) 08/03/2021: Cycle 20 Day 1 of Dara/Dex (delayed due to scheduling error) 25) 09/02/2021: Cycle 21 Day 1 of Dara/Dex 26) 09/30/2021: Cycle 22 Day 1 of Dara/Dex 27) 10/28/2021: Cycle 23 Day 1 of Dara/Dex 28) 12/02/2021: Cycle 24 Day 1 of Dara/Dex 29) 12/30/2021: Cycle 25 Day 1 of Dara/Dex 30) 01/06/2022: Cycle 1 Day 1 of Kyprolis/Dex 31) 02/03/2022: Cycle 2 Day 1 of Kyprolis/Dex 32) 02/24/2022: Cycle 3 Day 1 of Kyprolis/Dex 33) 04/07/2022: Cycle 4 Day 1 of Kyprolis/Dex 34) 05/05/2022: Cycle 5 Day 1 of Kyprolis/Dex 35) 06/01/2022: Cycle 6 Day 1 of Kyprolis/Dex 36) 06/29/2022: Cycle 7 Day 1 of Kyprolis/Dex 37) 07/28/2022: Cycle 8 Day 1 of Kyprolis/Dex 38) 08/25/2022: Cycle 9 Day 1 of Kyprolis/Dex  Interval History:  ISAMI MEHRA III 67 y.o. male with medical history significant for  IgG Lambda Multiple Myeloma who presents for a follow up visit. The patient's last visit was on 08/11/2022.  He presents today to for cycle 9 day 1 of Kyprolis/Dex.   On exam today Mr. Real reports he is doing well without any significant changes to his health.  He has noticed that he has been napping more during the day but continues to complete his daily activities.  He denies any appetite changes or weight loss.  He denies nausea, vomiting or abdominal pain.  He has good output from his ostomy without any evidence of hematochezia or melena.  Patient is that he did not  have subsequent episodes of bleeding after exchanging his suprapubic catheter.  He plans to hold the Xarelto 24 hours before future catheter exchanges.  He denies fevers, chills, night sweats, shortness of breath, chest pain or cough.  He has no other complaints.  Rest of the 10 point ROS is  below.  Overall he is willing and able to continue with Kyprolis treatment at this time.   MEDICAL HISTORY:  Past Medical History:  Diagnosis Date   Adrenal insufficiency ([place])    on chronic dexamethasone   Anemia    Cancer ([place])    Coagulopathy ([place])    on xeralto/ s/p DVT while on coumadin,  IVC in place   Diabetes mellitus without complication ([place])    type 2   Gross hematuria 7/14   post foley cath procedure   History of blood transfusion 7/14   Multiple myeloma    thoracic T8 with paraplegia s/p resection- on chemo at visit 10/13/10   Multiple myeloma    Multiple myeloma without mention of remission    Neurogenic bladder    Neurogenic bowel    Paraplegia ([place])    Partial small bowel obstruction ([place]) during dec 2011 admission    SURGICAL HISTORY: Past Surgical History:  Procedure Laterality Date   COLONOSCOPY WITH PROPOFOL N/A 04/12/2017   Procedure: COLONOSCOPY WITH PROPOFOL;  Surgeon: Rubie Ficco Shipper, MD;  Location: [facility];  Service: Endoscopy;  Laterality: N/A;   COLONOSCOPY WITH PROPOFOL N/A 04/19/2017   Procedure: COLONOSCOPY WITH PROPOFOL;  Surgeon: Yetta Flock, MD;  Location: [facility];  Service: Gastroenterology;  Laterality: N/A;   COLOSTOMY  07/20/2011   Procedure: COLOSTOMY;  Surgeon: Judieth Keens, DO;  Location: [facility];  Service: General;;   COLOSTOMY REVISION  07/20/2011   Procedure: COLON RESECTION SIGMOID;  Surgeon: Judieth Keens, DO;  Location: [facility];  Service: General;;   CYSTOSCOPY N/A 04/04/2013   Procedure: CYSTOSCOPY WITH LITHALOPAXY;  Surgeon: Alexis Frock, MD;  Location: [facility];  Service: Urology;  Laterality: N/A;   INSERTION OF SUPRAPUBIC CATHETER N/A 04/04/2013   Procedure: INSERTION OF SUPRAPUBIC CATHETER;  Surgeon: Alexis Frock, MD;  Location: [facility];  Service: Urology;  Laterality: N/A;   LAPAROTOMY  07/20/2011   Procedure: EXPLORATORY LAPAROTOMY;  Surgeon: Judieth Keens, DO;  Location: [facility];  Service:  General;  Laterality: N/A;   myeloma thoracic T8 with parpaplegia s/p thoracotomy and thoracic T7-9 cage placement on Dec 26th 2011  07/18/10    SOCIAL HISTORY: Social History   Socioeconomic History   Marital status: Married    Spouse name: Not on file   Number of children: Not on file   Years of education: Not on file   Highest education level: Not on file  Occupational History   Not on file  Tobacco Use   Smoking status: Never   Smokeless tobacco: Never  Vaping Use   Vaping Use: Never used  Substance and Sexual Activity   Alcohol use: No   Drug use: No   Sexual activity: Never  Other Topics Concern   Not on file  Social History Narrative   Not on file   Social Determinants of Health   Financial Resource Strain: Not on file  Food Insecurity: Not on file  Transportation Needs: Not on file  Physical Activity: Not on file  Stress: Not on file  Social Connections: Not on file  Intimate Partner Violence: Not on file    FAMILY HISTORY: Family History  Problem  Relation Age of Onset   Ovarian cancer Mother    Diabetes Father     ALLERGIES:  is allergic to feraheme [ferumoxytol].  MEDICATIONS:  Current Outpatient Medications  Medication Sig Dispense Refill   acyclovir (ZOVIRAX) 400 MG tablet Take 1 tablet (400 mg total) by mouth 2 (two) times daily. 60 tablet 2   atorvastatin (LIPITOR) 10 MG tablet Take 10 mg by mouth daily.     baclofen (LIORESAL) 20 MG tablet Take 20 mg by mouth 2 (two) times daily.     diazepam (DIASTAT ACUDIAL) 10 MG GEL SMARTSIG:By Mouth     dorzolamide (TRUSOPT) 2 % ophthalmic solution 1 drop 2 (two) times daily.     iron polysaccharides (NU-IRON) 150 MG capsule Take 1 capsule (150 mg total) by mouth daily. 30 capsule 0   latanoprost (XALATAN) 0.005 % ophthalmic solution Place 1 drop into both eyes at bedtime.     ondansetron (ZOFRAN) 8 MG tablet Take 1 tablet (8 mg total) by mouth every 8 (eight) hours as needed for nausea or vomiting. 30  tablet 0   rivaroxaban (XARELTO) 10 MG TABS tablet Take 1 tablet (10 mg total) by mouth daily with supper. 30 tablet 9   Skin Protectants, Misc. (EUCERIN) cream Apply 1 application topically 2 (two) times daily as needed for dry skin.      zinc oxide (BALMEX) 11.3 % CREA cream Apply 1 application topically 2 (two) times daily.     No current facility-administered medications for this visit.   Facility-Administered Medications Ordered in Other Visits  Medication Dose Route Frequency Provider Last Rate Last Admin   sodium chloride flush (NS) 0.9 % injection 10 mL  10 mL Intravenous PRN Alvy Bimler, Ni, MD        REVIEW OF SYSTEMS:   Constitutional: ( - ) fevers, ( - )  chills , ( - ) night sweats Eyes: ( - ) blurriness of vision, ( - ) double vision, ( - ) watery eyes Ears, nose, mouth, throat, and face: ( - ) mucositis, ( - ) sore throat Respiratory: ( - ) cough, ( - ) dyspnea, ( - ) wheezes Cardiovascular: ( - ) palpitation, ( - ) chest discomfort, ( - ) lower extremity swelling Gastrointestinal:  ( - ) nausea, ( - ) heartburn, ( - ) change in bowel habits Skin: ( - ) abnormal skin rashes Lymphatics: ( - ) new lymphadenopathy, ( - ) easy bruising Neurological: ( - ) numbness, ( - ) tingling, ( - ) new weaknesses Behavioral/Psych: ( - ) mood change, ( - ) new changes  All other systems were reviewed with the patient and are negative.  PHYSICAL EXAMINATION: ECOG PERFORMANCE STATUS: paraplegic.   Vitals:   08/25/22 1046  BP: (!) 141/79  Pulse: 90  Resp: 16  Temp: 97.9 F (36.6 C)  SpO2: 100%       Filed Weights   08/25/22 1046  Weight: 260 lb 11.2 oz (118.3 kg)     GENERAL: well appearing middle aged [place] American male alert, no distress and comfortable SKIN: skin color, texture, turgor are normal, no rashes or significant lesions EYES: conjunctiva are pink and non-injected, sclera clear LUNGS: clear to auscultation and percussion with normal breathing effort HEART:  regular rate & rhythm and no murmurs and no lower extremity edema Musculoskeletal: no cyanosis of digits and no clubbing  PSYCH: alert & oriented x 3, fluent speech NEURO: paraplegic, no use of LE bilaterally.   LABORATORY DATA:  I have reviewed the data as listed    Latest Ref Rng & Units 08/25/2022   10:23 AM 08/11/2022    8:30 AM 08/04/2022   12:12 PM  CBC  WBC 4.0 - 10.5 K/uL 6.6  7.3  7.7   Hemoglobin 13.0 - 17.0 g/dL 10.9  11.3  12.5   Hematocrit 39.0 - 52.0 % 32.2  33.0  36.4   Platelets 150 - 400 K/uL 440  159  196        Latest Ref Rng & Units 08/25/2022   10:23 AM 08/11/2022    8:30 AM 08/04/2022   12:12 PM  CMP  Glucose 70 - 99 mg/dL 131  141  100   BUN 8 - 23 mg/dL 9  19  17    Creatinine 0.61 - 1.24 mg/dL 0.70  0.85  0.79   Sodium 135 - 145 mmol/L 145  140  140   Potassium 3.5 - 5.1 mmol/L 3.5  3.6  3.6   Chloride 98 - 111 mmol/L 112  109  106   CO2 22 - 32 mmol/L 26  25  28    Calcium 8.9 - 10.3 mg/dL 9.4  9.2  9.8   Total Protein 6.5 - 8.1 g/dL 6.1  5.8  6.8   Total Bilirubin 0.3 - 1.2 mg/dL 0.7  0.8  0.9   Alkaline Phos 38 - 126 U/L 81  77  88   AST 15 - 41 U/L 15  21  14    ALT 0 - 44 U/L 17  28  19      Lab Results  Component Value Date   MPROTEIN 0.2 (H) 07/28/2022   MPROTEIN 0.2 (H) 07/06/2022   MPROTEIN 0.2 (H) 06/30/2022   Lab Results  Component Value Date   KPAFRELGTCHN 11.9 07/28/2022   KPAFRELGTCHN 5.9 07/06/2022   KPAFRELGTCHN 11.8 06/30/2022   LAMBDASER 9.2 07/28/2022   LAMBDASER 5.3 (L) 07/06/2022   LAMBDASER 9.9 06/30/2022   KAPLAMBRATIO 1.29 07/28/2022   KAPLAMBRATIO 1.11 07/06/2022   KAPLAMBRATIO 1.19 06/30/2022    RADIOGRAPHIC STUDIES: XR HIP UNILAT W OR W/O PELVIS 2-3 VIEWS LEFT  Result Date: 08/22/2022 2 view radiographs of the left hip shows a small amount of heterotopic ossification with degenerative changes of the hip.  There are smaller lytic lesions in the left femur that are also unchanged.  XR HIP UNILAT W OR W/O PELVIS 1V  RIGHT  Result Date: 08/22/2022 2 view radiographs of the right hip shows heterotopic ossification around the hip with degenerative changes of the hip joint.  There are lytic lesions of the proximal femur the largest is 22 mm in diameter on the AP view and lateral is 27 mm.  These are unchanged.   ASSESSMENT & PLAN DARNELL JESCHKE 67 y.o. male with medical history significant for  IgG Lambda Multiple Myeloma who presents for a follow up visit.  After review of the labs, discussion with the patient, and reviewed the imaging his findings are most consistent with a relapsed multiple myeloma.The patient has had excellent success before with treatment of his myeloma with Velcade, Revlimid, and dexamethasone.  Transition to daratumumab therapy and was on a once monthly treatments but unfortunately developed progression of disease.  We have transitioned to Kyprolis/Dex.  # IgG Lambda Multiple Myeloma, Relapsed (ISS Stage II) --findings are most consistent with relapsed multiple myeloma. Patient previously successfully treated with Velcade/Rev/Dex and Daratumumab/Velcade/Dex. On 07/02/2020 he transitioned to monthly daratumumab alone.  --due to to rise in M  protein, switched to Kyprolis and Dexamethasone on 01/06/2022 Plan: --Labs show white blood cell count 6.6, hemoglobin 10.9, MCV 81.9, and platelets of 440.  Creatinine and LFTs are normal.  Myeloma labs from 07/28/2022 showed M protein dropped to 0.2 with normalization of kappa lambda ratio to 1.29 --patient will proceed with Cycle 9 Day 1 Kyprolis today.  --return for weekly Kyprolis and clinic visit  in 2 weeks.   #History of DVT --He had placement of IVC filter, remains on Xarelto. --Due to poor mobility, and lack of bleeding complications, I recommend he remain on Xarelto indefinitely. --caution if Plt count were to drop <50  #Snoring/Risk for OSA: --Made referral to sleep center to test for OSA.   # Supportive Care -- provided patient with an  albuterol inhaler (for use with daratumumab) --acyclovir 400mg  BID for VZV prophylaxis --zofran 8mg  q8H PRN for nausea/vomiting  --Zometa is being held in the setting of his prior episode of osteonecrosis of the jaw  Orders Placed This Encounter  Procedures   Polysomnography 4 or more parameters    Standing Status:   Future    Standing Expiration Date:   08/26/2023    Order Specific Question:   Where should this test be performed:    Answer:   [facility]    All questions were answered. The patient knows to call the clinic with any problems, questions or concerns.  I have spent a total of 30 minutes minutes of face-to-face and non-face-to-face time, preparing to see the patient,  performing a medically appropriate examination, counseling and educating the patient, communicating with other health care professionals, documenting clinical information in the electronic health record,  and care coordination.   Dede Query PA-C Dept of Hematology and [place] at [facility] Phone: [phone]    08/26/2022 2:00 PM   Literature Support:  Darrin Nipper, Petrucci MT, [place], [place], [place], [place], Spada S, [place], Ponticelli E, [place], Cavo M, Di Toritto TC, Shela Nevin F, Monte[place] V, Palumbo A, Boccadoro M, Larocca A. Once-weekly versus twice-weekly carfilzomib in patients with newly diagnosed multiple myeloma: a pooled analysis of two phase I/II studies. Haematologica. 2019 Aug;104(8):1640-1647.  --Once-weekly 70 mg/m2 carfilzomib as induction and maintenance therapy for newly diagnosed multiple myeloma patients was as safe and effective as twice-weekly 36 mg/m2 carfilzomib and provided a more convenient schedule.

## 2022-08-28 LAB — KAPPA/LAMBDA LIGHT CHAINS
Kappa free light chain: 17.1 mg/L (ref 3.3–19.4)
Kappa, lambda light chain ratio: 1.55 (ref 0.26–1.65)
Lambda free light chains: 11 mg/L (ref 5.7–26.3)

## 2022-08-31 LAB — MULTIPLE MYELOMA PANEL, SERUM
Albumin SerPl Elph-Mcnc: 3.1 g/dL (ref 2.9–4.4)
Albumin/Glob SerPl: 1.2 (ref 0.7–1.7)
Alpha 1: 0.3 g/dL (ref 0.0–0.4)
Alpha2 Glob SerPl Elph-Mcnc: 0.8 g/dL (ref 0.4–1.0)
B-Globulin SerPl Elph-Mcnc: 1 g/dL (ref 0.7–1.3)
Gamma Glob SerPl Elph-Mcnc: 0.6 g/dL (ref 0.4–1.8)
Globulin, Total: 2.7 g/dL (ref 2.2–3.9)
IgA: 52 mg/dL — ABNORMAL LOW (ref 61–437)
IgG (Immunoglobin G), Serum: 728 mg/dL (ref 603–1613)
IgM (Immunoglobulin M), Srm: 36 mg/dL (ref 20–172)
M Protein SerPl Elph-Mcnc: 0.2 g/dL — ABNORMAL HIGH
Total Protein ELP: 5.8 g/dL — ABNORMAL LOW (ref 6.0–8.5)

## 2022-09-01 ENCOUNTER — Other Ambulatory Visit: Payer: Self-pay

## 2022-09-01 ENCOUNTER — Inpatient Hospital Stay: Payer: Medicare (Managed Care)

## 2022-09-01 VITALS — BP 132/80 | HR 80 | Temp 97.9°F | Resp 18

## 2022-09-01 DIAGNOSIS — C9002 Multiple myeloma in relapse: Secondary | ICD-10-CM

## 2022-09-01 DIAGNOSIS — Z5111 Encounter for antineoplastic chemotherapy: Secondary | ICD-10-CM | POA: Diagnosis not present

## 2022-09-01 LAB — CBC WITH DIFFERENTIAL (CANCER CENTER ONLY)
Abs Immature Granulocytes: 0.04 10*3/uL (ref 0.00–0.07)
Basophils Absolute: 0 10*3/uL (ref 0.0–0.1)
Basophils Relative: 0 %
Eosinophils Absolute: 0.5 10*3/uL (ref 0.0–0.5)
Eosinophils Relative: 7 %
HCT: 34.2 % — ABNORMAL LOW (ref 39.0–52.0)
Hemoglobin: 11.7 g/dL — ABNORMAL LOW (ref 13.0–17.0)
Immature Granulocytes: 1 %
Lymphocytes Relative: 12 %
Lymphs Abs: 0.9 10*3/uL (ref 0.7–4.0)
MCH: 27.7 pg (ref 26.0–34.0)
MCHC: 34.2 g/dL (ref 30.0–36.0)
MCV: 80.9 fL (ref 80.0–100.0)
Monocytes Absolute: 1.3 10*3/uL — ABNORMAL HIGH (ref 0.1–1.0)
Monocytes Relative: 17 %
Neutro Abs: 4.5 10*3/uL (ref 1.7–7.7)
Neutrophils Relative %: 63 %
Platelet Count: 186 10*3/uL (ref 150–400)
RBC: 4.23 MIL/uL (ref 4.22–5.81)
RDW: 17.4 % — ABNORMAL HIGH (ref 11.5–15.5)
WBC Count: 7.2 10*3/uL (ref 4.0–10.5)
nRBC: 0 % (ref 0.0–0.2)

## 2022-09-01 LAB — CMP (CANCER CENTER ONLY)
ALT: 14 U/L (ref 0–44)
AST: 12 U/L — ABNORMAL LOW (ref 15–41)
Albumin: 3.6 g/dL (ref 3.5–5.0)
Alkaline Phosphatase: 74 U/L (ref 38–126)
Anion gap: 6 (ref 5–15)
BUN: 15 mg/dL (ref 8–23)
CO2: 25 mmol/L (ref 22–32)
Calcium: 9.2 mg/dL (ref 8.9–10.3)
Chloride: 112 mmol/L — ABNORMAL HIGH (ref 98–111)
Creatinine: 0.71 mg/dL (ref 0.61–1.24)
GFR, Estimated: >60 mL/min (ref >60)
Glucose, Bld: 120 mg/dL — ABNORMAL HIGH (ref 70–99)
Potassium: 3.7 mmol/L (ref 3.5–5.1)
Sodium: 143 mmol/L (ref 135–145)
Total Bilirubin: 0.7 mg/dL (ref 0.3–1.2)
Total Protein: 6.4 g/dL — ABNORMAL LOW (ref 6.5–8.1)

## 2022-09-01 LAB — URIC ACID: Uric Acid, Serum: 6.3 mg/dL (ref 3.7–8.6)

## 2022-09-01 LAB — LACTATE DEHYDROGENASE: LDH: 165 U/L (ref 98–192)

## 2022-09-01 MED ORDER — SODIUM CHLORIDE 0.9 % IV SOLN
40.0000 mg | Freq: Once | INTRAVENOUS | Status: AC
  Administered 2022-09-01: 40 mg via INTRAVENOUS
  Filled 2022-09-01: qty 4

## 2022-09-01 MED ORDER — SODIUM CHLORIDE 0.9 % IV SOLN: Freq: Once | INTRAVENOUS | Status: AC

## 2022-09-01 MED ORDER — DEXTROSE 5 % IV SOLN
70.0000 mg/m2 | Freq: Once | INTRAVENOUS | Status: AC
  Administered 2022-09-01: 150 mg via INTRAVENOUS
  Filled 2022-09-01: qty 60

## 2022-09-01 NOTE — Patient Instructions (Signed)
[facility]   Discharge Instructions: Thank you for choosing [facility] to provide your oncology and hematology care.   If you have a lab appointment with the [facility], please go directly to the [facility] and check in at the registration area.   Wear comfortable clothing and clothing appropriate for easy access to any Portacath or PICC line.   We strive to give you quality time with your provider. You may need to reschedule your appointment if you arrive late (15 or more minutes).  Arriving late affects you and other patients whose appointments are after yours.  Also, if you miss three or more appointments without notifying the office, you may be dismissed from the clinic at the provider's discretion.      For prescription refill requests, have your pharmacy contact our office and allow 72 hours for refills to be completed.    Today you received the following chemotherapy and/or immunotherapy agents: Carfilzomib (Kyprolis)       To help prevent nausea and vomiting after your treatment, we encourage you to take your nausea medication as directed.  BELOW ARE SYMPTOMS THAT SHOULD BE REPORTED IMMEDIATELY: *FEVER GREATER THAN 100.4 F (38 C) OR HIGHER *CHILLS OR SWEATING *NAUSEA AND VOMITING THAT IS NOT CONTROLLED WITH YOUR NAUSEA MEDICATION *UNUSUAL SHORTNESS OF BREATH *UNUSUAL BRUISING OR BLEEDING *URINARY PROBLEMS (pain or burning when urinating, or frequent urination) *BOWEL PROBLEMS (unusual diarrhea, constipation, pain near the anus) TENDERNESS IN MOUTH AND THROAT WITH OR WITHOUT PRESENCE OF ULCERS (sore throat, sores in mouth, or a toothache) UNUSUAL RASH, SWELLING OR PAIN  UNUSUAL VAGINAL DISCHARGE OR ITCHING   Items with * indicate a potential emergency and should be followed up as soon as possible or go to the Emergency Department if any problems should occur.  Please show the CHEMOTHERAPY ALERT CARD or IMMUNOTHERAPY  ALERT CARD at check-in to the Emergency Department and triage nurse.  Should you have questions after your visit or need to cancel or reschedule your appointment, please contact [facility]  Dept: [phone]  and follow the prompts.  Office hours are 8:00 a.m. to 4:30 p.m. Monday - Friday. Please note that voicemails left after 4:00 p.m. may not be returned until the following business day.  We are closed weekends and major holidays. You have access to a nurse at all times for urgent questions. Please call the main number to the clinic Dept: [phone] and follow the prompts.   For any non-urgent questions, you may also contact your provider using MyChart. We now offer e-Visits for anyone 65 and older to request care online for non-urgent symptoms. For details visit [email or web address].   Also download the MyChart app! Go to the app store, search "MyChart", open the app, select [facility], and log in with your MyChart username and password.

## 2022-09-07 ENCOUNTER — Other Ambulatory Visit: Payer: Self-pay | Admitting: Physician Assistant

## 2022-09-07 ENCOUNTER — Other Ambulatory Visit: Payer: Self-pay

## 2022-09-07 DIAGNOSIS — R400 Somnolence: Secondary | ICD-10-CM

## 2022-09-08 ENCOUNTER — Inpatient Hospital Stay: Payer: Medicare (Managed Care)

## 2022-09-08 ENCOUNTER — Inpatient Hospital Stay (HOSPITAL_BASED_OUTPATIENT_CLINIC_OR_DEPARTMENT_OTHER): Payer: Medicare (Managed Care) | Admitting: Physician Assistant

## 2022-09-08 ENCOUNTER — Other Ambulatory Visit: Payer: Self-pay

## 2022-09-08 VITALS — BP 143/78 | HR 74 | Temp 98.0°F | Resp 18

## 2022-09-08 DIAGNOSIS — Z5111 Encounter for antineoplastic chemotherapy: Secondary | ICD-10-CM | POA: Diagnosis not present

## 2022-09-08 DIAGNOSIS — C9002 Multiple myeloma in relapse: Secondary | ICD-10-CM

## 2022-09-08 LAB — CBC WITH DIFFERENTIAL (CANCER CENTER ONLY)
Abs Immature Granulocytes: 0.05 10*3/uL (ref 0.00–0.07)
Basophils Absolute: 0 10*3/uL (ref 0.0–0.1)
Basophils Relative: 0 %
Eosinophils Absolute: 0.5 10*3/uL (ref 0.0–0.5)
Eosinophils Relative: 6 %
HCT: 33.6 % — ABNORMAL LOW (ref 39.0–52.0)
Hemoglobin: 11.4 g/dL — ABNORMAL LOW (ref 13.0–17.0)
Immature Granulocytes: 1 %
Lymphocytes Relative: 12 %
Lymphs Abs: 0.9 10*3/uL (ref 0.7–4.0)
MCH: 27.7 pg (ref 26.0–34.0)
MCHC: 33.9 g/dL (ref 30.0–36.0)
MCV: 81.6 fL (ref 80.0–100.0)
Monocytes Absolute: 1.2 10*3/uL — ABNORMAL HIGH (ref 0.1–1.0)
Monocytes Relative: 15 %
Neutro Abs: 5.1 10*3/uL (ref 1.7–7.7)
Neutrophils Relative %: 66 %
Platelet Count: 179 10*3/uL (ref 150–400)
RBC: 4.12 MIL/uL — ABNORMAL LOW (ref 4.22–5.81)
RDW: 17.9 % — ABNORMAL HIGH (ref 11.5–15.5)
WBC Count: 7.7 10*3/uL (ref 4.0–10.5)
nRBC: 0 % (ref 0.0–0.2)

## 2022-09-08 LAB — CMP (CANCER CENTER ONLY)
ALT: 19 U/L (ref 0–44)
AST: 11 U/L — ABNORMAL LOW (ref 15–41)
Albumin: 3.7 g/dL (ref 3.5–5.0)
Alkaline Phosphatase: 77 U/L (ref 38–126)
Anion gap: 6 (ref 5–15)
BUN: 15 mg/dL (ref 8–23)
CO2: 27 mmol/L (ref 22–32)
Calcium: 9.5 mg/dL (ref 8.9–10.3)
Chloride: 111 mmol/L (ref 98–111)
Creatinine: 0.69 mg/dL (ref 0.61–1.24)
GFR, Estimated: >60 mL/min (ref >60)
Glucose, Bld: 121 mg/dL — ABNORMAL HIGH (ref 70–99)
Potassium: 3.7 mmol/L (ref 3.5–5.1)
Sodium: 144 mmol/L (ref 135–145)
Total Bilirubin: 0.7 mg/dL (ref 0.3–1.2)
Total Protein: 6 g/dL — ABNORMAL LOW (ref 6.5–8.1)

## 2022-09-08 LAB — LACTATE DEHYDROGENASE: LDH: 158 U/L (ref 98–192)

## 2022-09-08 LAB — URIC ACID: Uric Acid, Serum: 5.5 mg/dL (ref 3.7–8.6)

## 2022-09-08 MED ORDER — DEXTROSE 5 % IV SOLN
70.0000 mg/m2 | Freq: Once | INTRAVENOUS | Status: AC
  Administered 2022-09-08: 150 mg via INTRAVENOUS
  Filled 2022-09-08: qty 60

## 2022-09-08 MED ORDER — SODIUM CHLORIDE 0.9 % IV SOLN: Freq: Once | INTRAVENOUS | Status: AC

## 2022-09-08 MED ORDER — SODIUM CHLORIDE 0.9 % IV SOLN
40.0000 mg | Freq: Once | INTRAVENOUS | Status: AC
  Administered 2022-09-08: 40 mg via INTRAVENOUS
  Filled 2022-09-08: qty 4

## 2022-09-08 NOTE — Patient Instructions (Signed)
[place]   Discharge Instructions: Thank you for choosing [facility] to provide your oncology and hematology care.   If you have a lab appointment with the [facility], please go directly to the [facility] and check in at the registration area.   Wear comfortable clothing and clothing appropriate for easy access to any Portacath or PICC line.   We strive to give you quality time with your provider. You may need to reschedule your appointment if you arrive late (15 or more minutes).  Arriving late affects you and other patients whose appointments are after yours.  Also, if you miss three or more appointments without notifying the office, you may be dismissed from the clinic at the provider's discretion.      For prescription refill requests, have your pharmacy contact our office and allow 72 hours for refills to be completed.    Today you received the following chemotherapy and/or immunotherapy agents: Carfilzomib (Kyprolis)       To help prevent nausea and vomiting after your treatment, we encourage you to take your nausea medication as directed.  BELOW ARE SYMPTOMS THAT SHOULD BE REPORTED IMMEDIATELY: *FEVER GREATER THAN 100.4 F (38 C) OR HIGHER *CHILLS OR SWEATING *NAUSEA AND VOMITING THAT IS NOT CONTROLLED WITH YOUR NAUSEA MEDICATION *UNUSUAL SHORTNESS OF BREATH *UNUSUAL BRUISING OR BLEEDING *URINARY PROBLEMS (pain or burning when urinating, or frequent urination) *BOWEL PROBLEMS (unusual diarrhea, constipation, pain near the anus) TENDERNESS IN MOUTH AND THROAT WITH OR WITHOUT PRESENCE OF ULCERS (sore throat, sores in mouth, or a toothache) UNUSUAL RASH, SWELLING OR PAIN  UNUSUAL VAGINAL DISCHARGE OR ITCHING   Items with * indicate a potential emergency and should be followed up as soon as possible or go to the Emergency Department if any problems should occur.  Please show the CHEMOTHERAPY ALERT CARD or IMMUNOTHERAPY  ALERT CARD at check-in to the Emergency Department and triage nurse.  Should you have questions after your visit or need to cancel or reschedule your appointment, please contact [facility]  Dept: [phone]  and follow the prompts.  Office hours are 8:00 a.m. to 4:30 p.m. Monday - Friday. Please note that voicemails left after 4:00 p.m. may not be returned until the following business day.  We are closed weekends and major holidays. You have access to a nurse at all times for urgent questions. Please call the main number to the clinic Dept: [phone] and follow the prompts.   For any non-urgent questions, you may also contact your provider using MyChart. We now offer e-Visits for anyone 59 and older to request care online for non-urgent symptoms. For details visit [email or web address].   Also download the MyChart app! Go to the app store, search "MyChart", open the app, select [facility], and log in with your MyChart username and password.

## 2022-09-08 NOTE — Progress Notes (Signed)
[facility] Telephone:([phone]) [phone]   [phone]) [phone]  PROGRESS NOTE  Patient Care Team: [name], MD (Inactive) as PCP - General (Family Medicine) [name], MD as Consulting Physician (Physical Medicine and Rehabilitation) Donia Guiles Lavon Paganini, PA-C as Physician Assistant (Physical Medicine and Rehabilitation) Orson Slick, MD as Consulting Physician (Hematology and Oncology)  Hematological/Oncological History # IgG Lambda Multiple Myeloma, Relapsed (ISS Stage II) 1) 06/2010: initial diagnosis of Multiple Myeloma after T8 compression fracture. Treated with Velcade/Revlimid/Dexamethasone and achieved a complete remission 2) Velcade was discontinued in September 2012 and that Revlimid and Decadron were discontinued in March 2013. 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 4) Followed by Dr. Alvy Bimler, last clinic visit 10/09/2019. At that time there was concern for relapse of his multiple myeloma.  5) Patient requested transfer to different provider after misunderstanding regarding imaging studies 6) 12/17/2019: transfer care to Dr. Lorenso Courier  7) 01/09/2020: Cycle 1 Day 1 of Dara/Velcade/Dex 8) 01/21/2020: presented as urgent visit for diarrhea and dehydration. Holding chemotherapy scheduled for 01/23/2020. 9) 01/30/2020: Resume dara/velcade/dex after resolution of diarrhea.  10) 02/13/2020: restaging labs show M protein 0.8, Kappa 4.5, lamba 17.2, ratio 0.26, urine M protein 53 (7.1%). All MM labs indicate improvement.  11) 03/10/2020: Cycle 4 Day 1 of Dara/Velcade/Dex. Transition to q 3 week daratumumab.  12) 06/04/2020:  Cycle 8 Day 1 of Dara/Velcade/Dex 13) 06/25/2020:  Cycle 9 Day 1 of Dara/Velcade/Dex 14) 07/22/2020: Cycle 10 Day 1 of  Dara//Dex 15) 08/18/2020: Cycle 11 Day 1 of Dara//Dex 16) 09/23/2020: Cycle 12 Day 1 of Dara//Dex 17) 10/22/2020: Cycle 13 Day 1 of  Dara/Dex  18) 11/19/2020: Cycle 14 Day 1 of Dara/Dex  19) 12/17/2020: Cycle 15 Day 1 of Dara/Dex  20) 01/17/2021: Cycle 16 Day 1 of Dara/Dex  21) 02/11/2021: Cycle 17 Day 1 of Dara/Dex 22) 03/10/2021: Cycle 18 Day 1 of Dara/Dex  23) 04/08/2021: Cycle 19 Day 1 of Dara/Dex  24) 08/03/2021: Cycle 20 Day 1 of Dara/Dex (delayed due to scheduling error) 25) 09/02/2021: Cycle 21 Day 1 of Dara/Dex 26) 09/30/2021: Cycle 22 Day 1 of Dara/Dex 27) 10/28/2021: Cycle 23 Day 1 of Dara/Dex 28) 12/02/2021: Cycle 24 Day 1 of Dara/Dex 29) 12/30/2021: Cycle 25 Day 1 of Dara/Dex 30) 01/06/2022: Cycle 1 Day 1 of Kyprolis/Dex 31) 02/03/2022: Cycle 2 Day 1 of Kyprolis/Dex 32) 02/24/2022: Cycle 3 Day 1 of Kyprolis/Dex 33) 04/07/2022: Cycle 4 Day 1 of Kyprolis/Dex 34) 05/05/2022: Cycle 5 Day 1 of Kyprolis/Dex 35) 06/01/2022: Cycle 6 Day 1 of Kyprolis/Dex 36) 06/29/2022: Cycle 7 Day 1 of Kyprolis/Dex 37) 07/28/2022: Cycle 8 Day 1 of Kyprolis/Dex 38) 08/25/2022: Cycle 9 Day 1 of Kyprolis/Dex  Interval History:  [name] 67 y.o. male with medical history significant for  IgG Lambda Multiple Myeloma who presents for a follow up visit. The patient's last visit was on 08/25/2022.  He presents today to for cycle 9 day 15 of Kyprolis/Dex.   On exam today Mr. Tokar continues to do well and tolerates therapy without any side effects. His energy is stable and he stll has some daytime sleepiness requiring naps. He completes his daily routines on his own. He denies weight or appetite changes. He has good output from his ostomy without hematochezia or melena.  He denies fevers, chills, night sweats, shortness of breath, chest pain, cough, nausea, vomiting or abdominal.  He has no other  complaints.  Rest of the 10 point ROS is below.  Overall he is willing and able to continue with Kyprolis treatment at this time.   MEDICAL HISTORY:  Past Medical History:  Diagnosis Date   Adrenal insufficiency ([place])    on chronic dexamethasone    Anemia    Cancer ([place])    Coagulopathy ([place])    on xeralto/ s/p DVT while on coumadin,  IVC in place   Diabetes mellitus without complication ([place])    type 2   Gross hematuria 7/14   post foley cath procedure   History of blood transfusion 7/14   Multiple myeloma    thoracic T8 with paraplegia s/p resection- on chemo at visit 10/13/10   Multiple myeloma    Multiple myeloma without mention of remission    Neurogenic bladder    Neurogenic bowel    Paraplegia ([place])    Partial small bowel obstruction ([place]) during dec 2011 admission    SURGICAL HISTORY: Past Surgical History:  Procedure Laterality Date   COLONOSCOPY WITH PROPOFOL N/A 04/12/2017   Procedure: COLONOSCOPY WITH PROPOFOL;  Surgeon: Kiwana Deblasi Shipper, MD;  Location: [facility];  Service: Endoscopy;  Laterality: N/A;   COLONOSCOPY WITH PROPOFOL N/A 04/19/2017   Procedure: COLONOSCOPY WITH PROPOFOL;  Surgeon: Yetta Flock, MD;  Location: [facility];  Service: Gastroenterology;  Laterality: N/A;   COLOSTOMY  07/20/2011   Procedure: COLOSTOMY;  Surgeon: Judieth Keens, DO;  Location: [facility];  Service: General;;   COLOSTOMY REVISION  07/20/2011   Procedure: COLON RESECTION SIGMOID;  Surgeon: Judieth Keens, DO;  Location: [facility];  Service: General;;   CYSTOSCOPY N/A 04/04/2013   Procedure: CYSTOSCOPY WITH LITHALOPAXY;  Surgeon: Alexis Frock, MD;  Location: [facility];  Service: Urology;  Laterality: N/A;   INSERTION OF SUPRAPUBIC CATHETER N/A 04/04/2013   Procedure: INSERTION OF SUPRAPUBIC CATHETER;  Surgeon: Alexis Frock, MD;  Location: [facility];  Service: Urology;  Laterality: N/A;   LAPAROTOMY  07/20/2011   Procedure: EXPLORATORY LAPAROTOMY;  Surgeon: Judieth Keens, DO;  Location: [facility];  Service: General;  Laterality: N/A;   myeloma thoracic T8 with parpaplegia s/p thoracotomy and thoracic T7-9 cage placement on Dec 26th 2011  07/18/10    SOCIAL HISTORY: Social History   Socioeconomic History   Marital  status: Married    Spouse name: Not on file   Number of children: Not on file   Years of education: Not on file   Highest education level: Not on file  Occupational History   Not on file  Tobacco Use   Smoking status: Never   Smokeless tobacco: Never  Vaping Use   Vaping Use: Never used  Substance and Sexual Activity   Alcohol use: No   Drug use: No   Sexual activity: Never  Other Topics Concern   Not on file  Social History Narrative   Not on file   Social Determinants of Health   Financial Resource Strain: Not on file  Food Insecurity: Not on file  Transportation Needs: Not on file  Physical Activity: Not on file  Stress: Not on file  Social Connections: Not on file  Intimate Partner Violence: Not on file    FAMILY HISTORY: Family History  Problem Relation Age of Onset   Ovarian cancer Mother    Diabetes Father     ALLERGIES:  is allergic to feraheme [ferumoxytol].  MEDICATIONS:  Current Outpatient Medications  Medication Sig Dispense Refill   acyclovir (ZOVIRAX) 400 MG  tablet Take 1 tablet (400 mg total) by mouth 2 (two) times daily. 60 tablet 2   atorvastatin (LIPITOR) 10 MG tablet Take 10 mg by mouth daily.     baclofen (LIORESAL) 20 MG tablet Take 20 mg by mouth 2 (two) times daily.     diazepam (DIASTAT ACUDIAL) 10 MG GEL SMARTSIG:By Mouth     dorzolamide (TRUSOPT) 2 % ophthalmic solution 1 drop 2 (two) times daily.     iron polysaccharides (NU-IRON) 150 MG capsule Take 1 capsule (150 mg total) by mouth daily. 30 capsule 0   latanoprost (XALATAN) 0.005 % ophthalmic solution Place 1 drop into both eyes at bedtime.     ondansetron (ZOFRAN) 8 MG tablet Take 1 tablet (8 mg total) by mouth every 8 (eight) hours as needed for nausea or vomiting. 30 tablet 0   rivaroxaban (XARELTO) 10 MG TABS tablet Take 1 tablet (10 mg total) by mouth daily with supper. 30 tablet 9   Skin Protectants, Misc. (EUCERIN) cream Apply 1 application topically 2 (two) times daily as  needed for dry skin.      zinc oxide (BALMEX) 11.3 % CREA cream Apply 1 application topically 2 (two) times daily.     No current facility-administered medications for this visit.   Facility-Administered Medications Ordered in Other Visits  Medication Dose Route Frequency Provider Last Rate Last Admin   0.9 %  sodium chloride infusion   Intravenous Once Orson Slick, MD       carfilzomib (KYPROLIS) 150 mg in dextrose 5 % 100 mL chemo infusion  70 mg/m2 (Capped) Intravenous Once Orson Slick, MD       dexamethasone (DECADRON) 40 mg in sodium chloride 0.9 % 50 mL IVPB  40 mg Intravenous Once Ledell Peoples IV, MD       sodium chloride flush (NS) 0.9 % injection 10 mL  10 mL Intravenous PRN Alvy Bimler, Ni, MD        REVIEW OF SYSTEMS:   Constitutional: ( - ) fevers, ( - )  chills , ( - ) night sweats Eyes: ( - ) blurriness of vision, ( - ) double vision, ( - ) watery eyes Ears, nose, mouth, throat, and face: ( - ) mucositis, ( - ) sore throat Respiratory: ( - ) cough, ( - ) dyspnea, ( - ) wheezes Cardiovascular: ( - ) palpitation, ( - ) chest discomfort, ( - ) lower extremity swelling Gastrointestinal:  ( - ) nausea, ( - ) heartburn, ( - ) change in bowel habits Skin: ( - ) abnormal skin rashes Lymphatics: ( - ) new lymphadenopathy, ( - ) easy bruising Neurological: ( - ) numbness, ( - ) tingling, ( - ) new weaknesses Behavioral/Psych: ( - ) mood change, ( - ) new changes  All other systems were reviewed with the patient and are negative.  PHYSICAL EXAMINATION: ECOG PERFORMANCE STATUS: paraplegic.   Vitals:   09/08/22 1029  BP: (!) 141/84  Pulse: 83  Resp: 19  Temp: 97.7 F (36.5 C)  SpO2: 100%    Filed Weights   09/08/22 1029  Weight: 258 lb 3.2 oz (117.1 kg)    GENERAL: well appearing middle aged [place] American male alert, no distress and comfortable SKIN: skin color, texture, turgor are normal, no rashes or significant lesions EYES: conjunctiva are pink and  non-injected, sclera clear LUNGS: clear to auscultation and percussion with normal breathing effort HEART: regular rate & rhythm and no murmurs Musculoskeletal: no  cyanosis of digits and no clubbing  PSYCH: alert & oriented x 3, fluent speech NEURO: paraplegic, no use of LE bilaterally.   LABORATORY DATA:  I have reviewed the data as listed    Latest Ref Rng & Units 09/08/2022    9:56 AM 09/01/2022    9:35 AM 08/25/2022   10:23 AM  CBC  WBC 4.0 - 10.5 K/uL 7.7  7.2  6.6   Hemoglobin 13.0 - 17.0 g/dL 11.4  11.7  10.9   Hematocrit 39.0 - 52.0 % 33.6  34.2  32.2   Platelets 150 - 400 K/uL 179  186  440        Latest Ref Rng & Units 09/08/2022    9:56 AM 09/01/2022    9:35 AM 08/25/2022   10:23 AM  CMP  Glucose 70 - 99 mg/dL 121  120  131   BUN 8 - 23 mg/dL 15  15  9    Creatinine 0.61 - 1.24 mg/dL 0.69  0.71  0.70   Sodium 135 - 145 mmol/L 144  143  145   Potassium 3.5 - 5.1 mmol/L 3.7  3.7  3.5   Chloride 98 - 111 mmol/L 111  112  112   CO2 22 - 32 mmol/L 27  25  26    Calcium 8.9 - 10.3 mg/dL 9.5  9.2  9.4   Total Protein 6.5 - 8.1 g/dL 6.0  6.4  6.1   Total Bilirubin 0.3 - 1.2 mg/dL 0.7  0.7  0.7   Alkaline Phos 38 - 126 U/L 77  74  81   AST 15 - 41 U/L 11  12  15    ALT 0 - 44 U/L 19  14  17      Lab Results  Component Value Date   MPROTEIN 0.2 (H) 08/25/2022   MPROTEIN 0.2 (H) 07/28/2022   MPROTEIN 0.2 (H) 07/06/2022   Lab Results  Component Value Date   KPAFRELGTCHN 17.1 08/25/2022   KPAFRELGTCHN 11.9 07/28/2022   KPAFRELGTCHN 5.9 07/06/2022   LAMBDASER 11.0 08/25/2022   LAMBDASER 9.2 07/28/2022   LAMBDASER 5.3 (L) 07/06/2022   KAPLAMBRATIO 1.55 08/25/2022   KAPLAMBRATIO 1.29 07/28/2022   KAPLAMBRATIO 1.11 07/06/2022    RADIOGRAPHIC STUDIES: XR HIP UNILAT W OR W/O PELVIS 2-3 VIEWS LEFT  Result Date: 08/22/2022 2 view radiographs of the left hip shows a small amount of heterotopic ossification with degenerative changes of the hip.  There are smaller lytic lesions  in the left femur that are also unchanged.  XR HIP UNILAT W OR W/O PELVIS 1V RIGHT  Result Date: 08/22/2022 2 view radiographs of the right hip shows heterotopic ossification around the hip with degenerative changes of the hip joint.  There are lytic lesions of the proximal femur the largest is 22 mm in diameter on the AP view and lateral is 27 mm.  These are unchanged.   ASSESSMENT & PLAN [name] 68 y.o. male with medical history significant for  IgG Lambda Multiple Myeloma who presents for a follow up visit.  After review of the labs, discussion with the patient, and reviewed the imaging his findings are most consistent with a relapsed multiple myeloma.The patient has had excellent success before with treatment of his myeloma with Velcade, Revlimid, and dexamethasone.  Transition to daratumumab therapy and was on a once monthly treatments but unfortunately developed progression of disease.  We have transitioned to Kyprolis/Dex.  # IgG Lambda Multiple Myeloma, Relapsed (ISS Stage II) --findings are  most consistent with relapsed multiple myeloma. Patient previously successfully treated with Velcade/Rev/Dex and Daratumumab/Velcade/Dex. On 07/02/2020 he transitioned to monthly daratumumab alone.  --due to to rise in M protein, switched to Kyprolis and Dexamethasone on 01/06/2022 Plan: --Labs show white blood cell count 7.7, hemoglobin 11.4, MCV 81.6, and platelets of 440.  Creatinine and LFTs are normal.  Myeloma labs from 08/25/2022 showed M protein dropped to 0.2 with normalization of kappa lambda ratio to 1.55 --patient will proceed with Cycle 9 Day 15 Kyprolis today.  --return for weekly Kyprolis and clinic visit  in 2 weeks.   #History of DVT --He had placement of IVC filter, remains on Xarelto. --Due to poor mobility, and lack of bleeding complications, I recommend he remain on Xarelto indefinitely. --caution if Plt count were to drop <50  #Snoring/Risk for OSA: --Scheduled for home  sleep center to test for OSA end of this month. We will follow up based on results.   # Supportive Care -- provided patient with an albuterol inhaler (for use with daratumumab) --acyclovir 400mg  BID for VZV prophylaxis --zofran 8mg  q8H PRN for nausea/vomiting  --Zometa is being held in the setting of his prior episode of osteonecrosis of the jaw  No orders of the defined types were placed in this encounter.   All questions were answered. The patient knows to call the clinic with any problems, questions or concerns.  I have spent a total of 30 minutes minutes of face-to-face and non-face-to-face time, preparing to see the patient,  performing a medically appropriate examination, counseling and educating the patient, communicating with other health care professionals, documenting clinical information in the electronic health record,  and care coordination.   Dede Query PA-C Dept of Hematology and [facility] at [facility] Phone: [phone]    09/08/2022 11:23 AM   Literature Support:  Darrin Nipper, Petrucci MT, [place], [place], [place], [place], Spada S, O'Brien, Ponticelli E, McIntosh, Cavo M, Di Toritto TC, Shela Nevin F, Montefusco V, Palumbo A, Boccadoro M, Larocca A. Once-weekly versus twice-weekly carfilzomib in patients with newly diagnosed multiple myeloma: a pooled analysis of two phase I/II studies. Haematologica. 2019 Aug;104(8):1640-1647.  --Once-weekly 70 mg/m2 carfilzomib as induction and maintenance therapy for newly diagnosed multiple myeloma patients was as safe and effective as twice-weekly 36 mg/m2 carfilzomib and provided a more convenient schedule.

## 2022-09-21 ENCOUNTER — Ambulatory Visit: Payer: Medicare (Managed Care) | Admitting: Adult Health

## 2022-09-21 ENCOUNTER — Encounter: Payer: Self-pay | Admitting: Adult Health

## 2022-09-21 VITALS — BP 120/86 | HR 84 | Ht 68.0 in | Wt 252.0 lb

## 2022-09-21 DIAGNOSIS — R0683 Snoring: Secondary | ICD-10-CM

## 2022-09-21 NOTE — Patient Instructions (Addendum)
I will be in touch regarding Sleep study  Healthy sleep regimen  Work on healthy weight.  Follow up in 6 weeks to discuss sleep results and treatment plan.

## 2022-09-21 NOTE — Progress Notes (Signed)
Error

## 2022-09-21 NOTE — Progress Notes (Signed)
$'@Patient'F$  ID: [name], male    DOB: 10-02-1955, 67 y.o.   MRN: [id number]  Chief Complaint  Patient presents with   Consult    Referring provider: No ref. provider found  HPI: 67 year old male seen for sleep consult September 21, 2022 for loud snoring, witnessed apneic events and daytime sleepiness. 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  Patient has a very complicated medical history with relapsed IgG lambda multiple myeloma undergoing active treatment with oncology.  History of paraplegia since 2012, neurogenic and neurogenic bowel and bladder.  Patient is accompanied by his wife.  Patient participates and the pace program.  Was recommended to be evaluated for possible sleep apnea.  A home sleep study has been set up.  Which is pending next month.  Patient has a long history of snoring.  But over the last 6 months his snoring has worsened along with witnessed apneic events and increased daytime sleepiness.  Patient says he does well if he is busy doing things like when he goes to the pace program however if he is at home sitting still or watching TV he will fall asleep very easily.  Patient is wheelchair-bound.  Requires a hoyer lift for transfer.  Patient does have a suprapubic catheter and colostomy.  Says he typically goes to bed about 9 PM.  Goes to sleep very quickly.  Wakes up 3-4 times at night.  Typically has to get up and change colostomy.  Patient says his upper body is very strong.  He does upper body exercises daily.  Typically gets up about 7:45 AM.  Has never had a sleep study before.  Epworth score is 6 typically gets sleepy if he watches TV or sit  still for long period of time.  Has about 1 cup of caffeine daily. Takes baclofen about twice daily.  Very rarely takes any type of pain medications. No symptoms suspicious for cataplexy or sleep paralysis.  Patient was recommended for in lab sleep study but is unable to do it due to lack of [facility] lift .   Allergies  Allergen Reactions   Feraheme [Ferumoxytol] Nausea And Vomiting    Felt like he was going to pass out.    Immunization History  Administered Date(s) Administered   Influenza,inj,Quad PF,6+ Mos 04/20/2017   Influenza-Unspecified 04/23/2014, 05/03/2015, 04/23/2022   Td 04/23/2002   Zoster Recombinat (Shingrix) 03/04/2020    Past Medical History:  Diagnosis Date   Adrenal insufficiency ([place])    on chronic dexamethasone   Anemia    Cancer ([place])    Coagulopathy ([place])    on xeralto/ s/p DVT while on coumadin,  IVC in place   Diabetes mellitus without complication ([place])    type 2   Gross hematuria 7/14   post foley cath procedure   History of blood transfusion 7/14   Multiple myeloma    thoracic T8 with paraplegia s/p resection- on chemo at visit 10/13/10   Multiple myeloma    Multiple myeloma without mention of remission    Neurogenic bladder    Neurogenic bowel    Paraplegia ([place])    Partial small bowel obstruction ([place]) during dec 2011 admission    Past Surgical History:  Procedure  Laterality Date   COLONOSCOPY WITH PROPOFOL N/A 04/12/2017   Procedure: COLONOSCOPY WITH PROPOFOL;  Surgeon: Irene Shipper, MD;  Location: [facility];  Service: Endoscopy;  Laterality: N/A;   COLONOSCOPY WITH PROPOFOL N/A 04/19/2017   Procedure: COLONOSCOPY WITH PROPOFOL;  Surgeon: Yetta Flock, MD;  Location: [facility];  Service: Gastroenterology;  Laterality: N/A;   COLOSTOMY  07/20/2011   Procedure: COLOSTOMY;  Surgeon: Judieth Keens, DO;  Location: [facility];  Service: General;;   COLOSTOMY REVISION  07/20/2011   Procedure: COLON RESECTION SIGMOID;  Surgeon: Judieth Keens, DO;  Location: [place];  Service: General;;   CYSTOSCOPY N/A 04/04/2013   Procedure: CYSTOSCOPY WITH LITHALOPAXY;  Surgeon: Alexis Frock, MD;  Location: [facility];  Service: Urology;  Laterality: N/A;   INSERTION OF SUPRAPUBIC CATHETER N/A 04/04/2013   Procedure: INSERTION OF SUPRAPUBIC CATHETER;  Surgeon: Alexis Frock, MD;  Location: [facility];  Service: Urology;  Laterality: N/A;   LAPAROTOMY  07/20/2011   Procedure: EXPLORATORY LAPAROTOMY;  Surgeon: Judieth Keens, DO;  Location: [facility];  Service: General;  Laterality: N/A;   myeloma thoracic T8 with parpaplegia s/p thoracotomy and thoracic T7-9 cage placement on Dec 26th 2011  07/18/10     Tobacco History: Social History   Tobacco Use  Smoking Status Never  Smokeless Tobacco Never   Counseling given: Not Answered   Outpatient Medications Prior to Visit  Medication Sig Dispense Refill   acyclovir (ZOVIRAX) 400 MG tablet Take 1 tablet (400 mg total) by mouth 2 (two) times daily. 60 tablet 2   atorvastatin (LIPITOR) 10 MG tablet Take 10 mg by mouth daily.     baclofen (LIORESAL) 20 MG tablet Take 20 mg by mouth 2 (two) times daily.     diazepam (DIASTAT ACUDIAL) 10 MG GEL SMARTSIG:By Mouth     dorzolamide (TRUSOPT) 2 % ophthalmic solution 1 drop 2 (two) times daily.     iron polysaccharides (NU-IRON) 150 MG capsule Take 1 capsule (150 mg total) by mouth daily. 30 capsule 0   latanoprost (XALATAN) 0.005 % ophthalmic solution Place 1 drop into both eyes at bedtime.     ondansetron (ZOFRAN) 8 MG tablet Take 1 tablet (8 mg total) by mouth every 8 (eight) hours as needed for nausea or vomiting. 30 tablet 0   rivaroxaban (XARELTO) 10 MG TABS tablet Take 1 tablet (10 mg total) by mouth daily with supper. 30 tablet 9   Skin Protectants, Misc. (EUCERIN) cream Apply 1 application topically 2 (two) times daily as needed for dry skin.      zinc oxide (BALMEX) 11.3 % CREA cream Apply 1 application topically 2 (two) times daily.      Facility-Administered Medications Prior to Visit  Medication Dose Route Frequency Provider Last Rate Last Admin   sodium chloride flush (NS) 0.9 % injection 10 mL  10 mL Intravenous PRN Alvy Bimler, Ni, MD         Review of Systems:   Constitutional:   No  weight loss, night sweats,  Fevers, chills,  +fatigue, or  lassitude.  HEENT:   No headaches,  Difficulty swallowing,  Tooth/dental problems, or  Sore throat,                No sneezing, itching, ear ache, nasal congestion, post nasal drip,   CV:  No chest pain,  Orthopnea, PND, swelling in lower extremities, anasarca, dizziness, palpitations, syncope.   GI  No heartburn, indigestion, abdominal pain, nausea, vomiting, diarrhea, change  in bowel habits, loss of appetite, bloody stools.   Resp:   No chest wall deformity  Skin: no rash or lesions.  GU: no dysuria, change in color of urine, no urgency or frequency.  No flank pain, no hematuria   MS:  No joint pain or swelling.  No decreased range of motion.  No back pain.    Physical Exam  BP 120/86 (BP Location: Right Arm, Patient Position: Sitting, Cuff Size: Normal)   Pulse 84   Ht '5\' 8"'$  (1.727 m)   Wt 252 lb (114.3 kg)   SpO2 100%   BMI 38.32 kg/m   GEN: A/Ox3; pleasant , NAD, well nourished , in wc    HEENT:  [place]/AT,   NOSE-clear, THROAT-clear, no lesions, no postnasal drip or exudate noted.  Class Howell-IV MP airway  NECK:  Supple w/ fair ROM; no JVD; normal carotid impulses w/o bruits; no thyromegaly or nodules palpated; no lymphadenopathy.    RESP  Clear  P & A; w/o, wheezes/ rales/ or rhonchi. no accessory muscle use, no dullness to percussion  CARD:  RRR, no m/r/g, tr peripheral edema, pulses intact, no cyanosis or clubbing.  GI/GU   Soft & nt; nml bowel sounds; no organomegaly or masses detected.  Colostomy and suprapubic cath   Musco: Warm bil, no deformities or joint swelling noted. Bilateral cushion boots   Neuro: alert, no focal deficits noted.   Paraplegic  Skin: Warm, no lesions or rashes    Lab Results:     BNP No results found for: "BNP"  ProBNP       No data to display          No results found for: "NITRICOXIDE"      Assessment & Plan:   Snoring Snoring, witnessed apneic events and daytime sleepiness all suspicious for underlying sleep apnea.  Would prefer an in lab sleep study as patient has multiple comorbidities along with underlying paraplegia however our sleep lab is not equipped to handle at this time.  Patient would need a Hoyer lift for transfer.  Will proceed as planned for a home sleep study.  Once results are available if sleep apnea is present will decide if CPAP titration is indicated.  Could look at an outside facility if needed.  - discussed how weight can impact sleep and risk for sleep disordered breathing - discussed options to assist with weight loss: combination of diet modification, cardiovascular and strength training exercises   - had an extensive discussion regarding the adverse health consequences related to untreated sleep disordered breathing - specifically discussed the risks for hypertension, coronary artery disease, cardiac dysrhythmias, cerebrovascular disease, and diabetes - lifestyle modification discussed   - discussed how sleep disruption can increase risk of accidents, particularly when driving - safe driving practices were discussed   Plan  Patient Instructions  I will be in touch regarding Sleep study  Healthy sleep regimen  Work on healthy weight.  Follow up in 6 weeks to discuss sleep results and treatment plan.      I spent   45 minutes dedicated to the care of this patient on the date of this encounter to include pre-visit review of records, face-to-face time with the patient discussing conditions above, post visit ordering of testing, clinical documentation with the electronic health record, making appropriate referrals as documented, and communicating  necessary findings to members of the patients care team.    Rexene Edison, NP 09/21/2022

## 2022-09-21 NOTE — Assessment & Plan Note (Signed)
Snoring, witnessed apneic events and daytime sleepiness all suspicious for underlying sleep apnea.  Would prefer an in lab sleep study as patient has multiple comorbidities along with underlying paraplegia however our sleep lab is not equipped to handle at this time.  Patient would need a Hoyer lift for transfer.  Will proceed as planned for a home sleep study.  Once results are available if sleep apnea is present will decide if CPAP titration is indicated.  Could look at an outside facility if needed.  - discussed how weight can impact sleep and risk for sleep disordered breathing - discussed options to assist with weight loss: combination of diet modification, cardiovascular and strength training exercises   - had an extensive discussion regarding the adverse health consequences related to untreated sleep disordered breathing - specifically discussed the risks for hypertension, coronary artery disease, cardiac dysrhythmias, cerebrovascular disease, and diabetes - lifestyle modification discussed   - discussed how sleep disruption can increase risk of accidents, particularly when driving - safe driving practices were discussed   Plan  Patient Instructions  I will be in touch regarding Sleep study  Healthy sleep regimen  Work on healthy weight.  Follow up in 6 weeks to discuss sleep results and treatment plan.

## 2022-09-22 ENCOUNTER — Inpatient Hospital Stay: Payer: Medicare (Managed Care)

## 2022-09-22 ENCOUNTER — Inpatient Hospital Stay: Payer: Medicare (Managed Care) | Attending: Physician Assistant

## 2022-09-22 ENCOUNTER — Inpatient Hospital Stay (HOSPITAL_BASED_OUTPATIENT_CLINIC_OR_DEPARTMENT_OTHER): Payer: Medicare (Managed Care) | Attending: Physician Assistant | Admitting: Hematology and Oncology

## 2022-09-22 ENCOUNTER — Other Ambulatory Visit: Payer: Self-pay

## 2022-09-22 VITALS — BP 141/75 | HR 70 | Temp 98.0°F | Resp 16

## 2022-09-22 DIAGNOSIS — E119 Type 2 diabetes mellitus without complications: Secondary | ICD-10-CM | POA: Diagnosis not present

## 2022-09-22 DIAGNOSIS — C9002 Multiple myeloma in relapse: Secondary | ICD-10-CM

## 2022-09-22 DIAGNOSIS — Z5111 Encounter for antineoplastic chemotherapy: Secondary | ICD-10-CM | POA: Insufficient documentation

## 2022-09-22 DIAGNOSIS — Z7901 Long term (current) use of anticoagulants: Secondary | ICD-10-CM | POA: Diagnosis not present

## 2022-09-22 DIAGNOSIS — Z86718 Personal history of other venous thrombosis and embolism: Secondary | ICD-10-CM | POA: Insufficient documentation

## 2022-09-22 DIAGNOSIS — Z8041 Family history of malignant neoplasm of ovary: Secondary | ICD-10-CM | POA: Diagnosis not present

## 2022-09-22 DIAGNOSIS — R0683 Snoring: Secondary | ICD-10-CM | POA: Insufficient documentation

## 2022-09-22 LAB — CBC WITH DIFFERENTIAL (CANCER CENTER ONLY)
Abs Immature Granulocytes: 0.03 10*3/uL (ref 0.00–0.07)
Basophils Absolute: 0.1 10*3/uL (ref 0.0–0.1)
Basophils Relative: 1 %
Eosinophils Absolute: 0.5 10*3/uL (ref 0.0–0.5)
Eosinophils Relative: 7 %
HCT: 33.9 % — ABNORMAL LOW (ref 39.0–52.0)
Hemoglobin: 11.4 g/dL — ABNORMAL LOW (ref 13.0–17.0)
Immature Granulocytes: 0 %
Lymphocytes Relative: 13 %
Lymphs Abs: 0.9 10*3/uL (ref 0.7–4.0)
MCH: 27.4 pg (ref 26.0–34.0)
MCHC: 33.6 g/dL (ref 30.0–36.0)
MCV: 81.5 fL (ref 80.0–100.0)
Monocytes Absolute: 0.7 10*3/uL (ref 0.1–1.0)
Monocytes Relative: 10 %
Neutro Abs: 4.9 10*3/uL (ref 1.7–7.7)
Neutrophils Relative %: 69 %
Platelet Count: 455 10*3/uL — ABNORMAL HIGH (ref 150–400)
RBC: 4.16 MIL/uL — ABNORMAL LOW (ref 4.22–5.81)
RDW: 17.8 % — ABNORMAL HIGH (ref 11.5–15.5)
WBC Count: 7.1 10*3/uL (ref 4.0–10.5)
nRBC: 0 % (ref 0.0–0.2)

## 2022-09-22 LAB — CMP (CANCER CENTER ONLY)
ALT: 19 U/L (ref 0–44)
AST: 20 U/L (ref 15–41)
Albumin: 3.6 g/dL (ref 3.5–5.0)
Alkaline Phosphatase: 72 U/L (ref 38–126)
Anion gap: 10 (ref 5–15)
BUN: 10 mg/dL (ref 8–23)
CO2: 22 mmol/L (ref 22–32)
Calcium: 9 mg/dL (ref 8.9–10.3)
Chloride: 112 mmol/L — ABNORMAL HIGH (ref 98–111)
Creatinine: 0.79 mg/dL (ref 0.61–1.24)
GFR, Estimated: >60 mL/min (ref >60)
Glucose, Bld: 136 mg/dL — ABNORMAL HIGH (ref 70–99)
Potassium: 3.9 mmol/L (ref 3.5–5.1)
Sodium: 144 mmol/L (ref 135–145)
Total Bilirubin: 0.6 mg/dL (ref 0.3–1.2)
Total Protein: 6.7 g/dL (ref 6.5–8.1)

## 2022-09-22 MED ORDER — SODIUM CHLORIDE 0.9 % IV SOLN: Freq: Once | INTRAVENOUS | Status: AC

## 2022-09-22 MED ORDER — SODIUM CHLORIDE 0.9 % IV SOLN
40.0000 mg | Freq: Once | INTRAVENOUS | Status: AC
  Administered 2022-09-22: 40 mg via INTRAVENOUS
  Filled 2022-09-22: qty 4

## 2022-09-22 MED ORDER — DEXTROSE 5 % IV SOLN
70.0000 mg/m2 | Freq: Once | INTRAVENOUS | Status: AC
  Administered 2022-09-22: 150 mg via INTRAVENOUS
  Filled 2022-09-22: qty 60

## 2022-09-22 NOTE — Patient Instructions (Signed)
[facility]   Discharge Instructions: Thank you for choosing [facility] to provide your oncology and hematology care.   If you have a lab appointment with the [facility], please go directly to the [facility] and check in at the registration area.   Wear comfortable clothing and clothing appropriate for easy access to any Portacath or PICC line.   We strive to give you quality time with your provider. You may need to reschedule your appointment if you arrive late (15 or more minutes).  Arriving late affects you and other patients whose appointments are after yours.  Also, if you miss three or more appointments without notifying the office, you may be dismissed from the clinic at the provider's discretion.      For prescription refill requests, have your pharmacy contact our office and allow 72 hours for refills to be completed.    Today you received the following chemotherapy and/or immunotherapy agents: Carfilzomib (Kyprolis)       To help prevent nausea and vomiting after your treatment, we encourage you to take your nausea medication as directed.  BELOW ARE SYMPTOMS THAT SHOULD BE REPORTED IMMEDIATELY: *FEVER GREATER THAN 100.4 F (38 C) OR HIGHER *CHILLS OR SWEATING *NAUSEA AND VOMITING THAT IS NOT CONTROLLED WITH YOUR NAUSEA MEDICATION *UNUSUAL SHORTNESS OF BREATH *UNUSUAL BRUISING OR BLEEDING *URINARY PROBLEMS (pain or burning when urinating, or frequent urination) *BOWEL PROBLEMS (unusual diarrhea, constipation, pain near the anus) TENDERNESS IN MOUTH AND THROAT WITH OR WITHOUT PRESENCE OF ULCERS (sore throat, sores in mouth, or a toothache) UNUSUAL RASH, SWELLING OR PAIN  UNUSUAL VAGINAL DISCHARGE OR ITCHING   Items with * indicate a potential emergency and should be followed up as soon as possible or go to the Emergency Department if any problems should occur.  Please show the CHEMOTHERAPY ALERT CARD or IMMUNOTHERAPY  ALERT CARD at check-in to the Emergency Department and triage nurse.  Should you have questions after your visit or need to cancel or reschedule your appointment, please contact [facility]  Dept: [phone]  and follow the prompts.  Office hours are 8:00 a.m. to 4:30 p.m. Monday - Friday. Please note that voicemails left after 4:00 p.m. may not be returned until the following business day.  We are closed weekends and major holidays. You have access to a nurse at all times for urgent questions. Please call the main number to the clinic Dept: [phone] and follow the prompts.   For any non-urgent questions, you may also contact your provider using MyChart. We now offer e-Visits for anyone 59 and older to request care online for non-urgent symptoms. For details visit [email or web address].   Also download the MyChart app! Go to the app store, search "MyChart", open the app, select [facility], and log in with your MyChart username and password.

## 2022-09-22 NOTE — Progress Notes (Signed)
[name] [phone]   [phone]  PROGRESS NOTE  Patient Care Team: [name], MD (Inactive) as PCP - General (Family Medicine) [name], MD as Consulting Physician (Physical Medicine and Rehabilitation) Donia Guiles Lavon Paganini, PA-C as Physician Assistant (Physical Medicine and Rehabilitation) Orson Slick, MD as Consulting Physician (Hematology and Oncology)  Hematological/Oncological History # IgG Lambda Multiple Myeloma, Relapsed (ISS Stage II) 1) 06/2010: initial diagnosis of Multiple Myeloma after T8 compression fracture. Treated with Velcade/Revlimid/Dexamethasone and achieved a complete remission 2) Velcade was discontinued in September 2012 and that Revlimid and Decadron were discontinued in March 2013. 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 4) Followed by Dr. Alvy Bimler, last clinic visit 10/09/2019. At that time there was concern for relapse of his multiple myeloma.  5) Patient requested transfer to different provider after misunderstanding regarding imaging studies 6) 12/17/2019: transfer care to Dr. Lorenso Courier  7) 01/09/2020: Cycle 1 Day 1 of Dara/Velcade/Dex 8) 01/21/2020: presented as urgent visit for diarrhea and dehydration. Holding chemotherapy scheduled for 01/23/2020. 9) 01/30/2020: Resume dara/velcade/dex after resolution of diarrhea.  10) 02/13/2020: restaging labs show M protein 0.8, Kappa 4.5, lamba 17.2, ratio 0.26, urine M protein 53 (7.1%). All MM labs indicate improvement.  11) 03/10/2020: Cycle 4 Day 1 of Dara/Velcade/Dex. Transition to q 3 week daratumumab.  12) 06/04/2020:  Cycle 8 Day 1 of Dara/Velcade/Dex 13) 06/25/2020:  Cycle 9 Day 1 of Dara/Velcade/Dex 14) 07/22/2020: Cycle 10 Day 1 of  Dara//Dex 15) 08/18/2020: Cycle 11 Day 1 of Dara//Dex 16) 09/23/2020: Cycle 12 Day 1 of Dara//Dex 17) 10/22/2020: Cycle 13 Day 1 of  Dara/Dex  18) 11/19/2020: Cycle 14 Day 1 of Dara/Dex  19) 12/17/2020: Cycle 15 Day 1 of Dara/Dex  20) 01/17/2021: Cycle 16 Day 1 of Dara/Dex  21) 02/11/2021: Cycle 17 Day 1 of Dara/Dex 22) 03/10/2021: Cycle 18 Day 1 of Dara/Dex  23) 04/08/2021: Cycle 19 Day 1 of Dara/Dex  24) 08/03/2021: Cycle 20 Day 1 of Dara/Dex (delayed due to scheduling error) 25) 09/02/2021: Cycle 21 Day 1 of Dara/Dex 26) 09/30/2021: Cycle 22 Day 1 of Dara/Dex 27) 10/28/2021: Cycle 23 Day 1 of Dara/Dex 28) 12/02/2021: Cycle 24 Day 1 of Dara/Dex 29) 12/30/2021: Cycle 25 Day 1 of Dara/Dex 30) 01/06/2022: Cycle 1 Day 1 of Kyprolis/Dex 31) 02/03/2022: Cycle 2 Day 1 of Kyprolis/Dex 32) 02/24/2022: Cycle 3 Day 1 of Kyprolis/Dex 33) 04/07/2022: Cycle 4 Day 1 of Kyprolis/Dex 34) 05/05/2022: Cycle 5 Day 1 of Kyprolis/Dex 35) 06/01/2022: Cycle 6 Day 1 of Kyprolis/Dex 36) 06/29/2022: Cycle 7 Day 1 of Kyprolis/Dex 37) 07/28/2022: Cycle 8 Day 1 of Kyprolis/Dex 38) 08/25/2022: Cycle 9 Day 1 of Kyprolis/Dex 39) 09/22/2022: Cycle 10 Day 1 of Kyprolis/Dex  Interval History:  [name] 67 y.o. male with medical history significant for  IgG Lambda Multiple Myeloma who presents for a follow up visit. The patient's last visit was on 08/25/2022.  He presents today to for cycle 9 day 15 of Kyprolis/Dex.   On exam today [name] continues to do well.  He reports that he is concerned about his increased weight up to 256 pounds.  He reports that this is "bulkiness".  He notes he is trying to lose weight and doing his best to eat last.  He is avoiding sodas and also minimizing his intake of sweets.  He reports he is tolerating his iron pills  well without any stomach upset or constipation.  Overall he is tolerating Kyprolis well but is having a little more numbness on his pinky and ring fingers on his left arm.  He is doing his best to try to do more stretching exercises.  He notes that his hands do tend to stay cold but he is not having any pain in his  extremities.  He notes that he has been encouraged to undergo sleep apnea testing by his wife he worries about the fact that he snores and can stop breathing at night sometimes.  He notes he does not require any naps throughout the day or has any daytime sleepiness.  He denies fevers, chills, night sweats, shortness of breath, chest pain, cough, nausea, vomiting or abdominal.  He has no other complaints.  Rest of the 10 point ROS is below.  Overall he is willing and able to continue with Kyprolis treatment at this time.   MEDICAL HISTORY:  Past Medical History:  Diagnosis Date   Adrenal insufficiency ([place])    on chronic dexamethasone   Anemia    Cancer ([place])    Coagulopathy ([place])    on xeralto/ s/p DVT while on coumadin,  IVC in place   Diabetes mellitus without complication ([place])    type 2   Gross hematuria 7/14   post foley cath procedure   History of blood transfusion 7/14   Multiple myeloma    thoracic T8 with paraplegia s/p resection- on chemo at visit 10/13/10   Multiple myeloma    Multiple myeloma without mention of remission    Neurogenic bladder    Neurogenic bowel    Paraplegia ([place])    Partial small bowel obstruction ([place]) during dec 2011 admission    SURGICAL HISTORY: Past Surgical History:  Procedure Laterality Date   COLONOSCOPY WITH PROPOFOL N/A 04/12/2017   Procedure: COLONOSCOPY WITH PROPOFOL;  Surgeon: Irene Shipper, MD;  Location: [facility];  Service: Endoscopy;  Laterality: N/A;   COLONOSCOPY WITH PROPOFOL N/A 04/19/2017   Procedure: COLONOSCOPY WITH PROPOFOL;  Surgeon: Yetta Flock, MD;  Location: [facility];  Service: Gastroenterology;  Laterality: N/A;   COLOSTOMY  07/20/2011   Procedure: COLOSTOMY;  Surgeon: Judieth Keens, DO;  Location: [facility];  Service: General;;   COLOSTOMY REVISION  07/20/2011   Procedure: COLON RESECTION SIGMOID;  Surgeon: Judieth Keens, DO;  Location: [facility];  Service: General;;   CYSTOSCOPY N/A 04/04/2013    Procedure: CYSTOSCOPY WITH LITHALOPAXY;  Surgeon: Alexis Frock, MD;  Location: [facility];  Service: Urology;  Laterality: N/A;   INSERTION OF SUPRAPUBIC CATHETER N/A 04/04/2013   Procedure: INSERTION OF SUPRAPUBIC CATHETER;  Surgeon: Alexis Frock, MD;  Location: [facility];  Service: Urology;  Laterality: N/A;   LAPAROTOMY  07/20/2011   Procedure: EXPLORATORY LAPAROTOMY;  Surgeon: Judieth Keens, DO;  Location: [facility];  Service: General;  Laterality: N/A;   myeloma thoracic T8 with parpaplegia s/p thoracotomy and thoracic T7-9 cage placement on Dec 26th 2011  07/18/10    SOCIAL HISTORY: Social History   Socioeconomic History   Marital status: Married    Spouse name: Not on file   Number of children: Not on file   Years of education: Not on file   Highest education level: Not on file  Occupational History   Not on file  Tobacco Use   Smoking status: Never   Smokeless tobacco: Never  Vaping Use   Vaping Use: Never used  Substance and Sexual  Activity   Alcohol use: No   Drug use: No   Sexual activity: Never  Other Topics Concern   Not on file  Social History Narrative   Not on file   Social Determinants of Health   Financial Resource Strain: Not on file  Food Insecurity: Not on file  Transportation Needs: Not on file  Physical Activity: Not on file  Stress: Not on file  Social Connections: Not on file  Intimate Partner Violence: Not on file    FAMILY HISTORY: Family History  Problem Relation Age of Onset   Ovarian cancer Mother    Diabetes Father     ALLERGIES:  is allergic to ferumoxytol.  MEDICATIONS:  Current Outpatient Medications  Medication Sig Dispense Refill   acyclovir (ZOVIRAX) 400 MG tablet Take 1 tablet (400 mg total) by mouth 2 (two) times daily. 60 tablet 2   atorvastatin (LIPITOR) 10 MG tablet Take 10 mg by mouth daily.     baclofen (LIORESAL) 20 MG tablet Take 20 mg by mouth 2 (two) times daily.     diazepam (DIASTAT ACUDIAL) 10 MG GEL  SMARTSIG:By Mouth     dorzolamide (TRUSOPT) 2 % ophthalmic solution 1 drop 2 (two) times daily.     iron polysaccharides (NU-IRON) 150 MG capsule Take 1 capsule (150 mg total) by mouth daily. 30 capsule 0   latanoprost (XALATAN) 0.005 % ophthalmic solution Place 1 drop into both eyes at bedtime.     ondansetron (ZOFRAN) 8 MG tablet Take 1 tablet (8 mg total) by mouth every 8 (eight) hours as needed for nausea or vomiting. 30 tablet 0   rivaroxaban (XARELTO) 10 MG TABS tablet Take 1 tablet (10 mg total) by mouth daily with supper. 30 tablet 9   Skin Protectants, Misc. (EUCERIN) cream Apply 1 application topically 2 (two) times daily as needed for dry skin.      zinc oxide (BALMEX) 11.3 % CREA cream Apply 1 application topically 2 (two) times daily.     No current facility-administered medications for this visit.   Facility-Administered Medications Ordered in Other Visits  Medication Dose Route Frequency Provider Last Rate Last Admin   sodium chloride flush (NS) 0.9 % injection 10 mL  10 mL Intravenous PRN Alvy Bimler, Ni, MD        REVIEW OF SYSTEMS:   Constitutional: ( - ) fevers, ( - )  chills , ( - ) night sweats Eyes: ( - ) blurriness of vision, ( - ) double vision, ( - ) watery eyes Ears, nose, mouth, throat, and face: ( - ) mucositis, ( - ) sore throat Respiratory: ( - ) cough, ( - ) dyspnea, ( - ) wheezes Cardiovascular: ( - ) palpitation, ( - ) chest discomfort, ( - ) lower extremity swelling Gastrointestinal:  ( - ) nausea, ( - ) heartburn, ( - ) change in bowel habits Skin: ( - ) abnormal skin rashes Lymphatics: ( - ) new lymphadenopathy, ( - ) easy bruising Neurological: ( - ) numbness, ( - ) tingling, ( - ) new weaknesses Behavioral/Psych: ( - ) mood change, ( - ) new changes  All other systems were reviewed with the patient and are negative.  PHYSICAL EXAMINATION: ECOG PERFORMANCE STATUS: paraplegic.   Vitals:   09/22/22 0938  BP: 112/72  Pulse: 95  Resp: 16  Temp: (!)  97.4 F (36.3 C)  SpO2: 100%    Filed Weights   09/22/22 0938  Weight: 256 lb 4.8 oz (116.3 kg)  GENERAL: well appearing middle aged [place] American male alert, no distress and comfortable SKIN: skin color, texture, turgor are normal, no rashes or significant lesions EYES: conjunctiva are pink and non-injected, sclera clear LUNGS: clear to auscultation and percussion with normal breathing effort HEART: regular rate & rhythm and no murmurs Musculoskeletal: no cyanosis of digits and no clubbing  PSYCH: alert & oriented x 3, fluent speech NEURO: paraplegic, no use of LE bilaterally.   LABORATORY DATA:  I have reviewed the data as listed    Latest Ref Rng & Units 09/22/2022    9:14 AM 09/08/2022    9:56 AM 09/01/2022    9:35 AM  CBC  WBC 4.0 - 10.5 K/uL 7.1  7.7  7.2   Hemoglobin 13.0 - 17.0 g/dL 11.4  11.4  11.7   Hematocrit 39.0 - 52.0 % 33.9  33.6  34.2   Platelets 150 - 400 K/uL 455  179  186        Latest Ref Rng & Units 09/22/2022    9:14 AM 09/08/2022    9:56 AM 09/01/2022    9:35 AM  CMP  Glucose 70 - 99 mg/dL 136  121  120   BUN 8 - 23 mg/dL '10  15  15   '$ Creatinine 0.61 - 1.24 mg/dL 0.79  0.69  0.71   Sodium 135 - 145 mmol/L 144  144  143   Potassium 3.5 - 5.1 mmol/L 3.9  3.7  3.7   Chloride 98 - 111 mmol/L 112  111  112   CO2 22 - 32 mmol/L '22  27  25   '$ Calcium 8.9 - 10.3 mg/dL 9.0  9.5  9.2   Total Protein 6.5 - 8.1 g/dL 6.7  6.0  6.4   Total Bilirubin 0.3 - 1.2 mg/dL 0.6  0.7  0.7   Alkaline Phos 38 - 126 U/L 72  77  74   AST 15 - 41 U/L '20  11  12   '$ ALT 0 - 44 U/L '19  19  14     '$ Lab Results  Component Value Date   MPROTEIN 0.2 (H) 08/25/2022   MPROTEIN 0.2 (H) 07/28/2022   MPROTEIN 0.2 (H) 07/06/2022   Lab Results  Component Value Date   KPAFRELGTCHN 17.1 08/25/2022   KPAFRELGTCHN 11.9 07/28/2022   KPAFRELGTCHN 5.9 07/06/2022   LAMBDASER 11.0 08/25/2022   LAMBDASER 9.2 07/28/2022   LAMBDASER 5.3 (L) 07/06/2022   KAPLAMBRATIO 1.55 08/25/2022    KAPLAMBRATIO 1.29 07/28/2022   KAPLAMBRATIO 1.11 07/06/2022    RADIOGRAPHIC STUDIES: No results found.  ASSESSMENT & PLAN [name] 67 y.o. male with medical history significant for  IgG Lambda Multiple Myeloma who presents for a follow up visit.  After review of the labs, discussion with the patient, and reviewed the imaging his findings are most consistent with a relapsed multiple myeloma.The patient has had excellent success before with treatment of his myeloma with Velcade, Revlimid, and dexamethasone.  Transition to daratumumab therapy and was on a once monthly treatments but unfortunately developed progression of disease.  We have transitioned to Kyprolis/Dex.  # IgG Lambda Multiple Myeloma, Relapsed (ISS Stage II) --findings are most consistent with relapsed multiple myeloma. Patient previously successfully treated with Velcade/Rev/Dex and Daratumumab/Velcade/Dex. On 07/02/2020 he transitioned to monthly daratumumab alone.  --due to to rise in M protein, switched to Kyprolis and Dexamethasone on 01/06/2022 Plan: --Labs show white blood cell count 7.1, hemoglobin 11.4, MCV 81.5, and platelets of 455 Creatinine  and LFTs are normal.  Myeloma labs from 08/25/2022 showed M protein dropped to 0.2 with normalization of kappa lambda ratio to 1.55 --patient will proceed with Cycle 10 Day 1 Kyprolis today.  --return for weekly Kyprolis and clinic visit  in 2 weeks.   #History of DVT --He had placement of IVC filter, remains on Xarelto. --Due to poor mobility, and lack of bleeding complications, I recommend he remain on Xarelto indefinitely. --caution if Plt count were to drop <50  #Snoring/Risk for OSA: --Scheduled for home sleep center to test for OSA end of this month. We will follow up based on results.   # Supportive Care -- provided patient with an albuterol inhaler (for use with daratumumab) --acyclovir '400mg'$  BID for VZV prophylaxis --zofran '8mg'$  q8H PRN for nausea/vomiting   --Zometa is being held in the setting of his prior episode of osteonecrosis of the jaw  No orders of the defined types were placed in this encounter.   All questions were answered. The patient knows to call the clinic with any problems, questions or concerns.  I have spent a total of 30 minutes minutes of face-to-face and non-face-to-face time, preparing to see the patient,  performing a medically appropriate examination, counseling and educating the patient, communicating with other health care professionals, documenting clinical information in the electronic health record,  and care coordination.   Dede Query PA-C Dept of Hematology and [place] at [facility] Phone: [phone]    09/23/2022 12:27 PM   Literature Support:  Darrin Nipper, Petrucci MT, [place], [place], [place], [place], Spada S, [place], Ponticelli E, [place], Cavo M, Di Toritto TC, Shela Nevin F, Monte[place]us[place] V, Palumbo A, Boccadoro M, Larocca A. Once-weekly versus twice-weekly carfilzomib in patients with newly diagnosed multiple myeloma: a pooled analysis of two phase I/II studies. Haematologica. 2019 Aug;104(8):1640-1647.  --Once-weekly 70 mg/m2 carfilzomib as induction and maintenance therapy for newly diagnosed multiple myeloma patients was as safe and effective as twice-weekly 36 mg/m2 carfilzomib and provided a more convenient schedule.

## 2022-09-22 NOTE — Progress Notes (Signed)
Per Dr. Lorenso Courier- ok to proceed without the metabolic panel today.

## 2022-09-23 ENCOUNTER — Encounter: Payer: Self-pay | Admitting: Hematology and Oncology

## 2022-09-25 LAB — KAPPA/LAMBDA LIGHT CHAINS
Kappa free light chain: 10.3 mg/L (ref 3.3–19.4)
Kappa, lambda light chain ratio: 1.04 (ref 0.26–1.65)
Lambda free light chains: 9.9 mg/L (ref 5.7–26.3)

## 2022-09-25 NOTE — Progress Notes (Signed)
Reviewed and agree with assessment/plan.   Chesley Mires, MD [facility] Pulmonary/Critical Care 09/25/2022, 10:24 AM Pager:  [phone]

## 2022-09-29 ENCOUNTER — Inpatient Hospital Stay: Payer: Medicare (Managed Care)

## 2022-09-29 VITALS — BP 138/74 | HR 66 | Temp 97.6°F | Resp 18

## 2022-09-29 DIAGNOSIS — Z5111 Encounter for antineoplastic chemotherapy: Secondary | ICD-10-CM | POA: Diagnosis not present

## 2022-09-29 DIAGNOSIS — C9002 Multiple myeloma in relapse: Secondary | ICD-10-CM

## 2022-09-29 LAB — CBC WITH DIFFERENTIAL/PLATELET
Abs Immature Granulocytes: 0.05 10*3/uL (ref 0.00–0.07)
Basophils Absolute: 0 10*3/uL (ref 0.0–0.1)
Basophils Relative: 1 %
Eosinophils Absolute: 0.4 10*3/uL (ref 0.0–0.5)
Eosinophils Relative: 5 %
HCT: 35.8 % — ABNORMAL LOW (ref 39.0–52.0)
Hemoglobin: 12.2 g/dL — ABNORMAL LOW (ref 13.0–17.0)
Immature Granulocytes: 1 %
Lymphocytes Relative: 14 %
Lymphs Abs: 1 10*3/uL (ref 0.7–4.0)
MCH: 27.3 pg (ref 26.0–34.0)
MCHC: 34.1 g/dL (ref 30.0–36.0)
MCV: 80.1 fL (ref 80.0–100.0)
Monocytes Absolute: 0.9 10*3/uL (ref 0.1–1.0)
Monocytes Relative: 13 %
Neutro Abs: 4.8 10*3/uL (ref 1.7–7.7)
Neutrophils Relative %: 66 %
Platelets: 225 10*3/uL (ref 150–400)
RBC: 4.47 MIL/uL (ref 4.22–5.81)
RDW: 18.2 % — ABNORMAL HIGH (ref 11.5–15.5)
WBC: 7.2 10*3/uL (ref 4.0–10.5)
nRBC: 0 % (ref 0.0–0.2)

## 2022-09-29 LAB — CMP (CANCER CENTER ONLY)
ALT: 14 U/L (ref 0–44)
AST: 12 U/L — ABNORMAL LOW (ref 15–41)
Albumin: 3.9 g/dL (ref 3.5–5.0)
Alkaline Phosphatase: 79 U/L (ref 38–126)
Anion gap: 7 (ref 5–15)
BUN: 14 mg/dL (ref 8–23)
CO2: 27 mmol/L (ref 22–32)
Calcium: 9.6 mg/dL (ref 8.9–10.3)
Chloride: 110 mmol/L (ref 98–111)
Creatinine: 0.74 mg/dL (ref 0.61–1.24)
GFR, Estimated: >60 mL/min (ref >60)
Glucose, Bld: 123 mg/dL — ABNORMAL HIGH (ref 70–99)
Potassium: 3.6 mmol/L (ref 3.5–5.1)
Sodium: 144 mmol/L (ref 135–145)
Total Bilirubin: 0.6 mg/dL (ref 0.3–1.2)
Total Protein: 6.6 g/dL (ref 6.5–8.1)

## 2022-09-29 LAB — MULTIPLE MYELOMA PANEL, SERUM
Albumin SerPl Elph-Mcnc: 3.2 g/dL (ref 2.9–4.4)
Albumin/Glob SerPl: 1.2 (ref 0.7–1.7)
Alpha 1: 0.3 g/dL (ref 0.0–0.4)
Alpha2 Glob SerPl Elph-Mcnc: 0.9 g/dL (ref 0.4–1.0)
B-Globulin SerPl Elph-Mcnc: 1 g/dL (ref 0.7–1.3)
Gamma Glob SerPl Elph-Mcnc: 0.5 g/dL (ref 0.4–1.8)
Globulin, Total: 2.7 g/dL (ref 2.2–3.9)
IgA: 41 mg/dL — ABNORMAL LOW (ref 61–437)
IgG (Immunoglobin G), Serum: 581 mg/dL — ABNORMAL LOW (ref 603–1613)
IgM (Immunoglobulin M), Srm: 23 mg/dL (ref 20–172)
M Protein SerPl Elph-Mcnc: 0.2 g/dL — ABNORMAL HIGH
Total Protein ELP: 5.9 g/dL — ABNORMAL LOW (ref 6.0–8.5)

## 2022-09-29 LAB — URIC ACID: Uric Acid, Serum: 6.3 mg/dL (ref 3.7–8.6)

## 2022-09-29 LAB — LACTATE DEHYDROGENASE: LDH: 155 U/L (ref 98–192)

## 2022-09-29 MED ORDER — HEPARIN SOD (PORK) LOCK FLUSH 100 UNIT/ML IV SOLN: 500.0000 [IU] | Freq: Once | INTRAVENOUS | Status: DC | PRN

## 2022-09-29 MED ORDER — SODIUM CHLORIDE 0.9 % IV SOLN: Freq: Once | INTRAVENOUS | Status: AC

## 2022-09-29 MED ORDER — SODIUM CHLORIDE 0.9% FLUSH: 10.0000 mL | INTRAVENOUS | Status: DC | PRN

## 2022-09-29 MED ORDER — DEXTROSE 5 % IV SOLN
70.0000 mg/m2 | Freq: Once | INTRAVENOUS | Status: AC
  Administered 2022-09-29: 150 mg via INTRAVENOUS
  Filled 2022-09-29: qty 60

## 2022-09-29 MED ORDER — SODIUM CHLORIDE 0.9 % IV SOLN
40.0000 mg | Freq: Once | INTRAVENOUS | Status: AC
  Administered 2022-09-29: 40 mg via INTRAVENOUS
  Filled 2022-09-29: qty 4

## 2022-09-29 NOTE — Patient Instructions (Signed)
[facility]  Discharge Instructions: Thank you for choosing [facility] to provide your oncology and hematology care.   If you have a lab appointment with the Cancer Center, please go directly to the Cancer Center and check in at the registration area.   Wear comfortable clothing and clothing appropriate for easy access to any Portacath or PICC line.   We strive to give you quality time with your provider. You may need to reschedule your appointment if you arrive late (15 or more minutes).  Arriving late affects you and other patients whose appointments are after yours.  Also, if you miss three or more appointments without notifying the office, you may be dismissed from the clinic at the provider's discretion.      For prescription refill requests, have your pharmacy contact our office and allow 72 hours for refills to be completed.    Today you received the following chemotherapy and/or immunotherapy agents [place]      To help prevent nausea and vomiting after your treatment, we encourage you to take your nausea medication as directed.  BELOW ARE SYMPTOMS THAT SHOULD BE REPORTED IMMEDIATELY: *FEVER GREATER THAN 100.4 F (38 C) OR HIGHER *CHILLS OR SWEATING *NAUSEA AND VOMITING THAT IS NOT CONTROLLED WITH YOUR NAUSEA MEDICATION *UNUSUAL SHORTNESS OF BREATH *UNUSUAL BRUISING OR BLEEDING *URINARY PROBLEMS (pain or burning when urinating, or frequent urination) *BOWEL PROBLEMS (unusual diarrhea, constipation, pain near the anus) TENDERNESS IN MOUTH AND THROAT WITH OR WITHOUT PRESENCE OF ULCERS (sore throat, sores in mouth, or a toothache) UNUSUAL RASH, SWELLING OR PAIN  UNUSUAL VAGINAL DISCHARGE OR ITCHING   Items with * indicate a potential emergency and should be followed up as soon as possible or go to the Emergency Department if any problems should occur.  Please show the CHEMOTHERAPY ALERT CARD or IMMUNOTHERAPY ALERT CARD at  check-in to the Emergency Department and triage nurse.  Should you have questions after your visit or need to cancel or reschedule your appointment, please contact [facility]  Dept: [phone]  and follow the prompts.  Office hours are 8:00 a.m. to 4:30 p.m. Monday - Friday. Please note that voicemails left after 4:00 p.m. may not be returned until the following business day.  We are closed weekends and major holidays. You have access to a nurse at all times for urgent questions. Please call the main number to the clinic Dept: [phone] and follow the prompts.   For any non-urgent questions, you may also contact your provider using MyChart. We now offer e-Visits for anyone 18 and older to request care online for non-urgent symptoms. For details visit [email or web address].   Also download the MyChart app! Go to the app store, search "MyChart", open the app, select [facility], and log in with your MyChart username and password.   

## 2022-09-29 NOTE — Progress Notes (Signed)
Per Terisa Starr OK to proceed with premedications and hydration prior to CMP result today.

## 2022-10-03 ENCOUNTER — Ambulatory Visit (HOSPITAL_BASED_OUTPATIENT_CLINIC_OR_DEPARTMENT_OTHER): Payer: Medicare (Managed Care) | Admitting: Internal Medicine

## 2022-10-03 DIAGNOSIS — R400 Somnolence: Secondary | ICD-10-CM

## 2022-10-04 ENCOUNTER — Ambulatory Visit (HOSPITAL_BASED_OUTPATIENT_CLINIC_OR_DEPARTMENT_OTHER): Payer: Medicare (Managed Care) | Attending: Physician Assistant | Admitting: Internal Medicine

## 2022-10-04 VITALS — Ht 68.0 in | Wt 252.0 lb

## 2022-10-04 DIAGNOSIS — R400 Somnolence: Secondary | ICD-10-CM

## 2022-10-04 DIAGNOSIS — G4733 Obstructive sleep apnea (adult) (pediatric): Secondary | ICD-10-CM | POA: Diagnosis present

## 2022-10-06 ENCOUNTER — Other Ambulatory Visit: Payer: Self-pay

## 2022-10-06 ENCOUNTER — Inpatient Hospital Stay (HOSPITAL_BASED_OUTPATIENT_CLINIC_OR_DEPARTMENT_OTHER): Payer: Medicare (Managed Care) | Admitting: Hematology and Oncology

## 2022-10-06 ENCOUNTER — Inpatient Hospital Stay: Payer: Medicare (Managed Care)

## 2022-10-06 DIAGNOSIS — C9002 Multiple myeloma in relapse: Secondary | ICD-10-CM

## 2022-10-06 DIAGNOSIS — Z5111 Encounter for antineoplastic chemotherapy: Secondary | ICD-10-CM | POA: Diagnosis not present

## 2022-10-06 LAB — CBC WITH DIFFERENTIAL (CANCER CENTER ONLY)
Abs Immature Granulocytes: 0.03 10*3/uL (ref 0.00–0.07)
Basophils Absolute: 0 10*3/uL (ref 0.0–0.1)
Basophils Relative: 1 %
Eosinophils Absolute: 0.4 10*3/uL (ref 0.0–0.5)
Eosinophils Relative: 6 %
HCT: 32.8 % — ABNORMAL LOW (ref 39.0–52.0)
Hemoglobin: 11.2 g/dL — ABNORMAL LOW (ref 13.0–17.0)
Immature Granulocytes: 1 %
Lymphocytes Relative: 14 %
Lymphs Abs: 0.9 10*3/uL (ref 0.7–4.0)
MCH: 27.2 pg (ref 26.0–34.0)
MCHC: 34.1 g/dL (ref 30.0–36.0)
MCV: 79.6 fL — ABNORMAL LOW (ref 80.0–100.0)
Monocytes Absolute: 0.8 10*3/uL (ref 0.1–1.0)
Monocytes Relative: 13 %
Neutro Abs: 4.2 10*3/uL (ref 1.7–7.7)
Neutrophils Relative %: 65 %
Platelet Count: 198 10*3/uL (ref 150–400)
RBC: 4.12 MIL/uL — ABNORMAL LOW (ref 4.22–5.81)
RDW: 18.8 % — ABNORMAL HIGH (ref 11.5–15.5)
WBC Count: 6.4 10*3/uL (ref 4.0–10.5)
nRBC: 0 % (ref 0.0–0.2)

## 2022-10-06 LAB — CMP (CANCER CENTER ONLY)
ALT: 15 U/L (ref 0–44)
AST: 11 U/L — ABNORMAL LOW (ref 15–41)
Albumin: 3.4 g/dL — ABNORMAL LOW (ref 3.5–5.0)
Alkaline Phosphatase: 79 U/L (ref 38–126)
Anion gap: 5 (ref 5–15)
BUN: 13 mg/dL (ref 8–23)
CO2: 26 mmol/L (ref 22–32)
Calcium: 8.9 mg/dL (ref 8.9–10.3)
Chloride: 113 mmol/L — ABNORMAL HIGH (ref 98–111)
Creatinine: 0.7 mg/dL (ref 0.61–1.24)
GFR, Estimated: >60 mL/min (ref >60)
Glucose, Bld: 112 mg/dL — ABNORMAL HIGH (ref 70–99)
Potassium: 3.4 mmol/L — ABNORMAL LOW (ref 3.5–5.1)
Sodium: 144 mmol/L (ref 135–145)
Total Bilirubin: 0.6 mg/dL (ref 0.3–1.2)
Total Protein: 5.8 g/dL — ABNORMAL LOW (ref 6.5–8.1)

## 2022-10-06 LAB — LACTATE DEHYDROGENASE: LDH: 130 U/L (ref 98–192)

## 2022-10-06 LAB — URIC ACID: Uric Acid, Serum: 6.7 mg/dL (ref 3.7–8.6)

## 2022-10-06 MED ORDER — SODIUM CHLORIDE 0.9% FLUSH: 10.0000 mL | INTRAVENOUS | Status: DC | PRN

## 2022-10-06 MED ORDER — DEXTROSE 5 % IV SOLN
70.0000 mg/m2 | Freq: Once | INTRAVENOUS | Status: AC
  Administered 2022-10-06: 150 mg via INTRAVENOUS
  Filled 2022-10-06: qty 75

## 2022-10-06 MED ORDER — SODIUM CHLORIDE 0.9 % IV SOLN: Freq: Once | INTRAVENOUS | Status: AC

## 2022-10-06 MED ORDER — HEPARIN SOD (PORK) LOCK FLUSH 100 UNIT/ML IV SOLN: 500.0000 [IU] | Freq: Once | INTRAVENOUS | Status: DC | PRN

## 2022-10-06 MED ORDER — SODIUM CHLORIDE 0.9 % IV SOLN
40.0000 mg | Freq: Once | INTRAVENOUS | Status: AC
  Administered 2022-10-06: 40 mg via INTRAVENOUS
  Filled 2022-10-06: qty 4

## 2022-10-06 NOTE — Patient Instructions (Signed)
[facility]  Discharge Instructions: Thank you for choosing [facility] to provide your oncology and hematology care.   If you have a lab appointment with the [facility], please go directly to the [facility] and check in at the registration area.   Wear comfortable clothing and clothing appropriate for easy access to any Portacath or PICC line.   We strive to give you quality time with your provider. You may need to reschedule your appointment if you arrive late (15 or more minutes).  Arriving late affects you and other patients whose appointments are after yours.  Also, if you miss three or more appointments without notifying the office, you may be dismissed from the clinic at the provider's discretion.      For prescription refill requests, have your pharmacy contact our office and allow 72 hours for refills to be completed.    Today you received the following chemotherapy and/or immunotherapy agents Carfilzomib      To help prevent nausea and vomiting after your treatment, we encourage you to take your nausea medication as directed.  BELOW ARE SYMPTOMS THAT SHOULD BE REPORTED IMMEDIATELY: *FEVER GREATER THAN 100.4 F (38 C) OR HIGHER *CHILLS OR SWEATING *NAUSEA AND VOMITING THAT IS NOT CONTROLLED WITH YOUR NAUSEA MEDICATION *UNUSUAL SHORTNESS OF BREATH *UNUSUAL BRUISING OR BLEEDING *URINARY PROBLEMS (pain or burning when urinating, or frequent urination) *BOWEL PROBLEMS (unusual diarrhea, constipation, pain near the anus) TENDERNESS IN MOUTH AND THROAT WITH OR WITHOUT PRESENCE OF ULCERS (sore throat, sores in mouth, or a toothache) UNUSUAL RASH, SWELLING OR PAIN  UNUSUAL VAGINAL DISCHARGE OR ITCHING   Items with * indicate a potential emergency and should be followed up as soon as possible or go to the Emergency Department if any problems should occur.  Please show the CHEMOTHERAPY ALERT CARD or IMMUNOTHERAPY ALERT CARD at  check-in to the Emergency Department and triage nurse.  Should you have questions after your visit or need to cancel or reschedule your appointment, please contact [facility]  Dept: [phone]  and follow the prompts.  Office hours are 8:00 a.m. to 4:30 p.m. Monday - Friday. Please note that voicemails left after 4:00 p.m. may not be returned until the following business day.  We are closed weekends and major holidays. You have access to a nurse at all times for urgent questions. Please call the main number to the clinic Dept: [phone] and follow the prompts.   For any non-urgent questions, you may also contact your provider using MyChart. We now offer e-Visits for anyone 37 and older to request care online for non-urgent symptoms. For details visit [email or web address].   Also download the MyChart app! Go to the app store, search "MyChart", open the app, select [facility], and log in with your MyChart username and password.

## 2022-10-06 NOTE — Progress Notes (Unsigned)
[place] [phone]   [phone]  PROGRESS NOTE  Patient Care Team: [name], MD (Inactive) as PCP - General (Family Medicine) [name], MD as Consulting Physician (Physical Medicine and Rehabilitation) Donia Guiles Lavon Paganini, PA-C as Physician Assistant (Physical Medicine and Rehabilitation) Orson Slick, MD as Consulting Physician (Hematology and Oncology)  Hematological/Oncological History # IgG Lambda Multiple Myeloma, Relapsed (ISS Stage II) 1) 06/2010: initial diagnosis of Multiple Myeloma after T8 compression fracture. Treated with Velcade/Revlimid/Dexamethasone and achieved a complete remission 2) Velcade was discontinued in September 2012 and that Revlimid and Decadron were discontinued in March 2013. 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 4) Followed by Dr. Alvy Bimler, last clinic visit 10/09/2019. At that time there was concern for relapse of his multiple myeloma.  5) Patient requested transfer to different provider after misunderstanding regarding imaging studies 6) 12/17/2019: transfer care to Dr. Lorenso Courier  7) 01/09/2020: Cycle 1 Day 1 of Dara/Velcade/Dex 8) 01/21/2020: presented as urgent visit for diarrhea and dehydration. Holding chemotherapy scheduled for 01/23/2020. 9) 01/30/2020: Resume dara/velcade/dex after resolution of diarrhea.  10) 02/13/2020: restaging labs show M protein 0.8, Kappa 4.5, lamba 17.2, ratio 0.26, urine M protein 53 (7.1%). All MM labs indicate improvement.  11) 03/10/2020: Cycle 4 Day 1 of Dara/Velcade/Dex. Transition to q 3 week daratumumab.  12) 06/04/2020:  Cycle 8 Day 1 of Dara/Velcade/Dex 13) 06/25/2020:  Cycle 9 Day 1 of Dara/Velcade/Dex 14) 07/22/2020: Cycle 10 Day 1 of  Dara//Dex 15) 08/18/2020: Cycle 11 Day 1 of Dara//Dex 16) 09/23/2020: Cycle 12 Day 1 of Dara//Dex 17) 10/22/2020: Cycle 13 Day 1 of  Dara/Dex  18) 11/19/2020: Cycle 14 Day 1 of Dara/Dex  19) 12/17/2020: Cycle 15 Day 1 of Dara/Dex  20) 01/17/2021: Cycle 16 Day 1 of Dara/Dex  21) 02/11/2021: Cycle 17 Day 1 of Dara/Dex 22) 03/10/2021: Cycle 18 Day 1 of Dara/Dex  23) 04/08/2021: Cycle 19 Day 1 of Dara/Dex  24) 08/03/2021: Cycle 20 Day 1 of Dara/Dex (delayed due to scheduling error) 25) 09/02/2021: Cycle 21 Day 1 of Dara/Dex 26) 09/30/2021: Cycle 22 Day 1 of Dara/Dex 27) 10/28/2021: Cycle 23 Day 1 of Dara/Dex 28) 12/02/2021: Cycle 24 Day 1 of Dara/Dex 29) 12/30/2021: Cycle 25 Day 1 of Dara/Dex 30) 01/06/2022: Cycle 1 Day 1 of Kyprolis/Dex 31) 02/03/2022: Cycle 2 Day 1 of Kyprolis/Dex 32) 02/24/2022: Cycle 3 Day 1 of Kyprolis/Dex 33) 04/07/2022: Cycle 4 Day 1 of Kyprolis/Dex 34) 05/05/2022: Cycle 5 Day 1 of Kyprolis/Dex 35) 06/01/2022: Cycle 6 Day 1 of Kyprolis/Dex 36) 06/29/2022: Cycle 7 Day 1 of Kyprolis/Dex 37) 07/28/2022: Cycle 8 Day 1 of Kyprolis/Dex 38) 08/25/2022: Cycle 9 Day 1 of Kyprolis/Dex 39) 09/22/2022: Cycle 10 Day 1 of Kyprolis/Dex  Interval History:  [name] 67 y.o. male with medical history significant for  IgG Lambda Multiple Myeloma who presents for a follow up visit. The patient's last visit was on 09/22/2022.  He presents today to for cycle 10 day 15 of Kyprolis/Dex.   On exam today [name] continues to tolerate treatment well and feel good.  He notes his energy levels are strong and he is doing his best to try to get outside enjoy the good weather.  He reports that he is not having any side effects as a result of his treatment and denies any numbness or tingling of his fingers and toes.  He reports that  his appetite is strong.  He did have some blood in his urine and it was "just enough to get attention".  He notes that he has received some conflicting reports on how to manage his Xarelto therapy.  He notes that when he has his Foley changed he should hold his Xarelto.  Otherwise he has had no major changes in his  health.  He denies fevers, chills, night sweats, shortness of breath, chest pain, cough, nausea, vomiting or abdominal.  He has no other complaints.  Rest of the 10 point ROS is below.  Overall he is willing and able to continue with Kyprolis treatment at this time.   MEDICAL HISTORY:  Past Medical History:  Diagnosis Date   Adrenal insufficiency ([place])    on chronic dexamethasone   Anemia    Cancer ([place])    Coagulopathy ([place])    on xeralto/ s/p DVT while on coumadin,  IVC in place   Diabetes mellitus without complication ([place])    type 2   Gross hematuria 7/14   post foley cath procedure   History of blood transfusion 7/14   Multiple myeloma    thoracic T8 with paraplegia s/p resection- on chemo at visit 10/13/10   Multiple myeloma    Multiple myeloma without mention of remission    Neurogenic bladder    Neurogenic bowel    Paraplegia ([place])    Partial small bowel obstruction ([place]) during dec 2011 admission    SURGICAL HISTORY: Past Surgical History:  Procedure Laterality Date   COLONOSCOPY WITH PROPOFOL N/A 04/12/2017   Procedure: COLONOSCOPY WITH PROPOFOL;  Surgeon: Irene Shipper, MD;  Location: [facility];  Service: Endoscopy;  Laterality: N/A;   COLONOSCOPY WITH PROPOFOL N/A 04/19/2017   Procedure: COLONOSCOPY WITH PROPOFOL;  Surgeon: Yetta Flock, MD;  Location: [facility];  Service: Gastroenterology;  Laterality: N/A;   COLOSTOMY  07/20/2011   Procedure: COLOSTOMY;  Surgeon: Judieth Keens, DO;  Location: [facility];  Service: General;;   COLOSTOMY REVISION  07/20/2011   Procedure: COLON RESECTION SIGMOID;  Surgeon: Judieth Keens, DO;  Location: [facility];  Service: General;;   CYSTOSCOPY N/A 04/04/2013   Procedure: CYSTOSCOPY WITH LITHALOPAXY;  Surgeon: Alexis Frock, MD;  Location: [facility];  Service: Urology;  Laterality: N/A;   INSERTION OF SUPRAPUBIC CATHETER N/A 04/04/2013   Procedure: INSERTION OF SUPRAPUBIC CATHETER;  Surgeon: Alexis Frock, MD;   Location: [facility];  Service: Urology;  Laterality: N/A;   LAPAROTOMY  07/20/2011   Procedure: EXPLORATORY LAPAROTOMY;  Surgeon: Judieth Keens, DO;  Location: [facility];  Service: General;  Laterality: N/A;   myeloma thoracic T8 with parpaplegia s/p thoracotomy and thoracic T7-9 cage placement on Dec 26th 2011  07/18/10    SOCIAL HISTORY: Social History   Socioeconomic History   Marital status: Married    Spouse name: Not on file   Number of children: Not on file   Years of education: Not on file   Highest education level: Not on file  Occupational History   Not on file  Tobacco Use   Smoking status: Never   Smokeless tobacco: Never  Vaping Use   Vaping Use: Never used  Substance and Sexual Activity   Alcohol use: No   Drug use: No   Sexual activity: Never  Other Topics Concern   Not on file  Social History Narrative   Not on file   Social Determinants of Health   Financial Resource Strain: Not on file  Food Insecurity: Not on file  Transportation Needs: Not on file  Physical Activity: Not on file  Stress: Not on file  Social Connections: Not on file  Intimate Partner Violence: Not on file    FAMILY HISTORY: Family History  Problem Relation Age of Onset   Ovarian cancer Mother    Diabetes Father     ALLERGIES:  is allergic to ferumoxytol.  MEDICATIONS:  Current Outpatient Medications  Medication Sig Dispense Refill   acyclovir (ZOVIRAX) 400 MG tablet Take 1 tablet (400 mg total) by mouth 2 (two) times daily. 60 tablet 2   atorvastatin (LIPITOR) 10 MG tablet Take 10 mg by mouth daily.     baclofen (LIORESAL) 20 MG tablet Take 20 mg by mouth 2 (two) times daily.     diazepam (DIASTAT ACUDIAL) 10 MG GEL SMARTSIG:By Mouth     dorzolamide (TRUSOPT) 2 % ophthalmic solution 1 drop 2 (two) times daily.     iron polysaccharides (NU-IRON) 150 MG capsule Take 1 capsule (150 mg total) by mouth daily. 30 capsule 0   latanoprost (XALATAN) 0.005 % ophthalmic solution Place  1 drop into both eyes at bedtime.     ondansetron (ZOFRAN) 8 MG tablet Take 1 tablet (8 mg total) by mouth every 8 (eight) hours as needed for nausea or vomiting. 30 tablet 0   rivaroxaban (XARELTO) 10 MG TABS tablet Take 1 tablet (10 mg total) by mouth daily with supper. 30 tablet 9   Skin Protectants, Misc. (EUCERIN) cream Apply 1 application topically 2 (two) times daily as needed for dry skin.      zinc oxide (BALMEX) 11.3 % CREA cream Apply 1 application topically 2 (two) times daily.     No current facility-administered medications for this visit.   Facility-Administered Medications Ordered in Other Visits  Medication Dose Route Frequency Provider Last Rate Last Admin   sodium chloride flush (NS) 0.9 % injection 10 mL  10 mL Intravenous PRN Alvy Bimler, Ni, MD        REVIEW OF SYSTEMS:   Constitutional: ( - ) fevers, ( - )  chills , ( - ) night sweats Eyes: ( - ) blurriness of vision, ( - ) double vision, ( - ) watery eyes Ears, nose, mouth, throat, and face: ( - ) mucositis, ( - ) sore throat Respiratory: ( - ) cough, ( - ) dyspnea, ( - ) wheezes Cardiovascular: ( - ) palpitation, ( - ) chest discomfort, ( - ) lower extremity swelling Gastrointestinal:  ( - ) nausea, ( - ) heartburn, ( - ) change in bowel habits Skin: ( - ) abnormal skin rashes Lymphatics: ( - ) new lymphadenopathy, ( - ) easy bruising Neurological: ( - ) numbness, ( - ) tingling, ( - ) new weaknesses Behavioral/Psych: ( - ) mood change, ( - ) new changes  All other systems were reviewed with the patient and are negative.  PHYSICAL EXAMINATION: ECOG PERFORMANCE STATUS: paraplegic.   Vitals:   10/06/22 1009  BP: 136/88  Pulse: 87  Resp: 15  Temp: 98.2 F (36.8 C)  SpO2: 100%    Filed Weights   10/06/22 1009  Weight: 258 lb 8 oz (117.3 kg)    GENERAL: well appearing middle aged [place] American male alert, no distress and comfortable SKIN: skin color, texture, turgor are normal, no rashes or significant  lesions EYES: conjunctiva are pink and non-injected, sclera clear LUNGS: clear to auscultation and percussion with normal breathing effort HEART: regular rate &  rhythm and no murmurs Musculoskeletal: no cyanosis of digits and no clubbing  PSYCH: alert & oriented x 3, fluent speech NEURO: paraplegic, no use of LE bilaterally.   LABORATORY DATA:  I have reviewed the data as listed    Latest Ref Rng & Units 10/06/2022    9:23 AM 09/29/2022    9:33 AM 09/22/2022    9:14 AM  CBC  WBC 4.0 - 10.5 K/uL 6.4  7.2  7.1   Hemoglobin 13.0 - 17.0 g/dL 11.2  12.2  11.4   Hematocrit 39.0 - 52.0 % 32.8  35.8  33.9   Platelets 150 - 400 K/uL 198  225  455        Latest Ref Rng & Units 10/06/2022    9:23 AM 09/29/2022    9:30 AM 09/22/2022    9:14 AM  CMP  Glucose 70 - 99 mg/dL 112  123  136   BUN 8 - 23 mg/dL 13  14  10    Creatinine 0.61 - 1.24 mg/dL 0.70  0.74  0.79   Sodium 135 - 145 mmol/L 144  144  144   Potassium 3.5 - 5.1 mmol/L 3.4  3.6  3.9   Chloride 98 - 111 mmol/L 113  110  112   CO2 22 - 32 mmol/L 26  27  22    Calcium 8.9 - 10.3 mg/dL 8.9  9.6  9.0   Total Protein 6.5 - 8.1 g/dL 5.8  6.6  6.7   Total Bilirubin 0.3 - 1.2 mg/dL 0.6  0.6  0.6   Alkaline Phos 38 - 126 U/L 79  79  72   AST 15 - 41 U/L 11  12  20    ALT 0 - 44 U/L 15  14  19      Lab Results  Component Value Date   MPROTEIN 0.2 (H) 09/22/2022   MPROTEIN 0.2 (H) 08/25/2022   MPROTEIN 0.2 (H) 07/28/2022   Lab Results  Component Value Date   KPAFRELGTCHN 10.3 09/22/2022   KPAFRELGTCHN 17.1 08/25/2022   KPAFRELGTCHN 11.9 07/28/2022   LAMBDASER 9.9 09/22/2022   LAMBDASER 11.0 08/25/2022   LAMBDASER 9.2 07/28/2022   KAPLAMBRATIO 1.04 09/22/2022   KAPLAMBRATIO 1.55 08/25/2022   KAPLAMBRATIO 1.29 07/28/2022    RADIOGRAPHIC STUDIES: No results found.  ASSESSMENT & PLAN [name] 67 y.o. male with medical history significant for  IgG Lambda Multiple Myeloma who presents for a follow up visit.  After review of  the labs, discussion with the patient, and reviewed the imaging his findings are most consistent with a relapsed multiple myeloma.The patient has had excellent success before with treatment of his myeloma with Velcade, Revlimid, and dexamethasone.  Transition to daratumumab therapy and was on a once monthly treatments but unfortunately developed progression of disease.  We have transitioned to Kyprolis/Dex.  # IgG Lambda Multiple Myeloma, Relapsed (ISS Stage II) --findings are most consistent with relapsed multiple myeloma. Patient previously successfully treated with Velcade/Rev/Dex and Daratumumab/Velcade/Dex. On 07/02/2020 he transitioned to monthly daratumumab alone.  --due to to rise in M protein, switched to Kyprolis and Dexamethasone on 01/06/2022 Plan: --Labs show white blood cell count 6.4, hemoglobin 11.2, MCV 79.6, and platelets of 198. Additionally creatinine and LFTs are normal.  Myeloma labs from 08/25/2022 showed M protein dropped to 0.2 with normalization of kappa lambda ratio to 1.55 --patient will proceed with Cycle 10 Day 15 Kyprolis today.  --return for weekly Kyprolis and clinic visit  in 2 weeks.   #  History of DVT --He had placement of IVC filter, remains on Xarelto. --Due to poor mobility, and lack of bleeding complications, I recommend he remain on Xarelto indefinitely. --caution if Plt count were to drop <50  #Snoring/Risk for OSA: --Scheduled for home sleep center to test for OSA end of this month. We will follow up based on results.   # Supportive Care -- provided patient with an albuterol inhaler (for use with daratumumab) --acyclovir 400mg  BID for VZV prophylaxis --zofran 8mg  q8H PRN for nausea/vomiting  --Zometa is being held in the setting of his prior episode of osteonecrosis of the jaw  No orders of the defined types were placed in this encounter.   All questions were answered. The patient knows to call the clinic with any problems, questions or  concerns.  I have spent a total of 30 minutes minutes of face-to-face and non-face-to-face time, preparing to see the patient,  performing a medically appropriate examination, counseling and educating the patient, communicating with other health care professionals, documenting clinical information in the electronic health record,  and care coordination.   Ledell Peoples, MD Department of Hematology/Oncology [place] at [facility] Phone: [phone] Pager: [phone] Email: [email or web address]   10/08/2022 2:35 PM   Literature Support:  Darrin Nipper, Petrucci MT, [place], [place], [place], [place], Spada S, [place], Ponticelli E, [place], Cavo M, Di Toritto TC, Shela Nevin F, [place] V, Palumbo A, Boccadoro M, Lar[place]ca A. Once-weekly versus twice-weekly carfilzomib in patients with newly diagnosed multiple myeloma: a pooled analysis of two phase I/II studies. Haematologica. 2019 Aug;104(8):1640-1647.  --Once-weekly 70 mg/m2 carfilzomib as induction and maintenance therapy for newly diagnosed multiple myeloma patients was as safe and effective as twice-weekly 36 mg/m2 carfilzomib and provided a more convenient schedule.

## 2022-10-07 DIAGNOSIS — R400 Somnolence: Secondary | ICD-10-CM | POA: Diagnosis not present

## 2022-10-07 NOTE — Procedures (Signed)
     Patient Name: [name], [name] Date: 10/04/2022 Gender: Male D.O.B: Nov 22, 1955 Age (years): 66 Referring Provider: Lincoln Brigham PA-C Height (inches): 27 Interpreting Physician: Baird Lyons MD, ABSM Weight (lbs): 252 RPSGT: Jacolyn Reedy BMI: 38 MRN: [id number] Neck Size: 17.50  CLINICAL INFORMATION Sleep Study Type: HST Indication for sleep study: OSA, sleep talking Epworth Sleepiness Score: 7  SLEEP STUDY TECHNIQUE A multi-channel overnight portable sleep study was performed. The channels recorded were: nasal airflow, thoracic respiratory movement, and oxygen saturation with a pulse oximetry. Snoring was also monitored.  MEDICATIONS Patient self administered medications include: none reported.  SLEEP ARCHITECTURE Patient was studied for 400 minutes. The sleep efficiency was 100.0 % and the patient was supine for 0%. The arousal index was 0.0 per hour.  RESPIRATORY PARAMETERS The overall AHI was 44.1 per hour, with a central apnea index of 0 per hour. The oxygen nadir was 75% during sleep.  CARDIAC DATA Mean heart rate during sleep was 82.1 bpm.  IMPRESSIONS - Severe obstructive sleep apnea occurred during this study (AHI = 44.1/h). - Oxygen desaturation was noted during this study (Min O2 = 75%, Mean 97%). - Patient snored.  DIAGNOSIS - Obstructive Sleep Apnea (G47.33)  RECOMMENDATIONS - Suggest CPAP titration sleep study or autopap. Other options would be based on clinical judgment. - Be careful with alcohol, sedatives and other CNS depressants that may worsen sleep apnea and disrupt normal sleep architecture. - Sleep hygiene should be reviewed to assess factors that may improve sleep quality. - Weight management and regular exercise should be initiated or continued.  [Electronically signed] 10/07/2022 12:14 PM  Baird Lyons MD, [place], American Board of Sleep Medicine NPI: [id number]                         [place], [place] [facility]  ELECTRONICALLY SIGNED ON:  10/07/2022, 12:12 PM [place] PH: [phone]   FX: [phone] [place]

## 2022-10-08 ENCOUNTER — Encounter: Payer: Self-pay | Admitting: Hematology and Oncology

## 2022-10-18 MED FILL — Carfilzomib For Inj 30 MG: INTRAVENOUS | Qty: 30 | Status: AC

## 2022-10-20 ENCOUNTER — Inpatient Hospital Stay: Payer: Medicare (Managed Care)

## 2022-10-20 ENCOUNTER — Other Ambulatory Visit: Payer: Self-pay

## 2022-10-20 ENCOUNTER — Inpatient Hospital Stay (HOSPITAL_BASED_OUTPATIENT_CLINIC_OR_DEPARTMENT_OTHER): Payer: Medicare (Managed Care) | Admitting: Hematology and Oncology

## 2022-10-20 VITALS — BP 138/80 | HR 74 | Temp 98.4°F | Resp 17

## 2022-10-20 VITALS — BP 142/97 | HR 90 | Temp 98.4°F | Resp 18

## 2022-10-20 DIAGNOSIS — C9002 Multiple myeloma in relapse: Secondary | ICD-10-CM

## 2022-10-20 DIAGNOSIS — Z7901 Long term (current) use of anticoagulants: Secondary | ICD-10-CM

## 2022-10-20 DIAGNOSIS — Z95828 Presence of other vascular implants and grafts: Secondary | ICD-10-CM

## 2022-10-20 DIAGNOSIS — Z5111 Encounter for antineoplastic chemotherapy: Secondary | ICD-10-CM | POA: Diagnosis not present

## 2022-10-20 LAB — CMP (CANCER CENTER ONLY)
ALT: 16 U/L (ref 0–44)
AST: 11 U/L — ABNORMAL LOW (ref 15–41)
Albumin: 3.9 g/dL (ref 3.5–5.0)
Alkaline Phosphatase: 100 U/L (ref 38–126)
Anion gap: 8 (ref 5–15)
BUN: 10 mg/dL (ref 8–23)
CO2: 26 mmol/L (ref 22–32)
Calcium: 9.4 mg/dL (ref 8.9–10.3)
Chloride: 111 mmol/L (ref 98–111)
Creatinine: 0.7 mg/dL (ref 0.61–1.24)
GFR, Estimated: >60 mL/min (ref >60)
Glucose, Bld: 124 mg/dL — ABNORMAL HIGH (ref 70–99)
Potassium: 3.9 mmol/L (ref 3.5–5.1)
Sodium: 145 mmol/L (ref 135–145)
Total Bilirubin: 0.6 mg/dL (ref 0.3–1.2)
Total Protein: 6.8 g/dL (ref 6.5–8.1)

## 2022-10-20 LAB — CBC WITH DIFFERENTIAL (CANCER CENTER ONLY)
Abs Immature Granulocytes: 0.03 10*3/uL (ref 0.00–0.07)
Basophils Absolute: 0.1 10*3/uL (ref 0.0–0.1)
Basophils Relative: 1 %
Eosinophils Absolute: 0.4 10*3/uL (ref 0.0–0.5)
Eosinophils Relative: 6 %
HCT: 35.7 % — ABNORMAL LOW (ref 39.0–52.0)
Hemoglobin: 12 g/dL — ABNORMAL LOW (ref 13.0–17.0)
Immature Granulocytes: 0 %
Lymphocytes Relative: 14 %
Lymphs Abs: 1 10*3/uL (ref 0.7–4.0)
MCH: 26.9 pg (ref 26.0–34.0)
MCHC: 33.6 g/dL (ref 30.0–36.0)
MCV: 80 fL (ref 80.0–100.0)
Monocytes Absolute: 0.6 10*3/uL (ref 0.1–1.0)
Monocytes Relative: 9 %
Neutro Abs: 5 10*3/uL (ref 1.7–7.7)
Neutrophils Relative %: 70 %
Platelet Count: 482 10*3/uL — ABNORMAL HIGH (ref 150–400)
RBC: 4.46 MIL/uL (ref 4.22–5.81)
RDW: 18.6 % — ABNORMAL HIGH (ref 11.5–15.5)
WBC Count: 7.1 10*3/uL (ref 4.0–10.5)
nRBC: 0 % (ref 0.0–0.2)

## 2022-10-20 LAB — LACTATE DEHYDROGENASE: LDH: 141 U/L (ref 98–192)

## 2022-10-20 LAB — URIC ACID: Uric Acid, Serum: 5 mg/dL (ref 3.7–8.6)

## 2022-10-20 MED ORDER — SODIUM CHLORIDE 0.9 % IV SOLN: Freq: Once | INTRAVENOUS | Status: DC

## 2022-10-20 MED ORDER — DEXTROSE 5 % IV SOLN
70.0000 mg/m2 | Freq: Once | INTRAVENOUS | Status: AC
  Administered 2022-10-20: 150 mg via INTRAVENOUS
  Filled 2022-10-20: qty 60

## 2022-10-20 MED ORDER — SODIUM CHLORIDE 0.9 % IV SOLN: Freq: Once | INTRAVENOUS | Status: AC

## 2022-10-20 MED ORDER — SODIUM CHLORIDE 0.9 % IV SOLN
40.0000 mg | Freq: Once | INTRAVENOUS | Status: AC
  Administered 2022-10-20: 40 mg via INTRAVENOUS
  Filled 2022-10-20: qty 4

## 2022-10-20 NOTE — Patient Instructions (Signed)
[facility]  Discharge Instructions: Thank you for choosing [facility] to provide your oncology and hematology care.   If you have a lab appointment with the Cancer Center, please go directly to the Cancer Center and check in at the registration area.   Wear comfortable clothing and clothing appropriate for easy access to any Portacath or PICC line.   We strive to give you quality time with your provider. You may need to reschedule your appointment if you arrive late (15 or more minutes).  Arriving late affects you and other patients whose appointments are after yours.  Also, if you miss three or more appointments without notifying the office, you may be dismissed from the clinic at the provider's discretion.      For prescription refill requests, have your pharmacy contact our office and allow 72 hours for refills to be completed.    Today you received the following chemotherapy and/or immunotherapy agents Carfilzomib      To help prevent nausea and vomiting after your treatment, we encourage you to take your nausea medication as directed.  BELOW ARE SYMPTOMS THAT SHOULD BE REPORTED IMMEDIATELY: *FEVER GREATER THAN 100.4 F (38 C) OR HIGHER *CHILLS OR SWEATING *NAUSEA AND VOMITING THAT IS NOT CONTROLLED WITH YOUR NAUSEA MEDICATION *UNUSUAL SHORTNESS OF BREATH *UNUSUAL BRUISING OR BLEEDING *URINARY PROBLEMS (pain or burning when urinating, or frequent urination) *BOWEL PROBLEMS (unusual diarrhea, constipation, pain near the anus) TENDERNESS IN MOUTH AND THROAT WITH OR WITHOUT PRESENCE OF ULCERS (sore throat, sores in mouth, or a toothache) UNUSUAL RASH, SWELLING OR PAIN  UNUSUAL VAGINAL DISCHARGE OR ITCHING   Items with * indicate a potential emergency and should be followed up as soon as possible or go to the Emergency Department if any problems should occur.  Please show the CHEMOTHERAPY ALERT CARD or IMMUNOTHERAPY ALERT CARD at  check-in to the Emergency Department and triage nurse.  Should you have questions after your visit or need to cancel or reschedule your appointment, please contact [facility]  Dept: [phone]  and follow the prompts.  Office hours are 8:00 a.m. to 4:30 p.m. Monday - Friday. Please note that voicemails left after 4:00 p.m. may not be returned until the following business day.  We are closed weekends and major holidays. You have access to a nurse at all times for urgent questions. Please call the main number to the clinic Dept: [phone] and follow the prompts.   For any non-urgent questions, you may also contact your provider using MyChart. We now offer e-Visits for anyone 18 and older to request care online for non-urgent symptoms. For details visit [email or web address].   Also download the MyChart app! Go to the app store, search "MyChart", open the app, select [place], and log in with your MyChart username and password.   

## 2022-10-20 NOTE — Progress Notes (Signed)
[place] [phone]   [phone]  PROGRESS NOTE  Patient Care Team: [name], MD as PCP - General (Family Medicine) [name], MD as Consulting Physician (Physical Medicine and Rehabilitation) Donia Guiles Lavon Paganini, PA-C as Physician Assistant (Physical Medicine and Rehabilitation) Orson Slick, MD as Consulting Physician (Hematology and Oncology)  Hematological/Oncological History # IgG Lambda Multiple Myeloma, Relapsed (ISS Stage II) 1) 06/2010: initial diagnosis of Multiple Myeloma after T8 compression fracture. Treated with Velcade/Revlimid/Dexamethasone and achieved a complete remission 2) Velcade was discontinued in September 2012 and that Revlimid and Decadron were discontinued in March 2013. 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 4) Followed by Dr. Alvy Bimler, last clinic visit 10/09/2019. At that time there was concern for relapse of his multiple myeloma.  5) Patient requested transfer to different provider after misunderstanding regarding imaging studies 6) 12/17/2019: transfer care to Dr. Lorenso Courier  7) 01/09/2020: Cycle 1 Day 1 of Dara/Velcade/Dex 8) 01/21/2020: presented as urgent visit for diarrhea and dehydration. Holding chemotherapy scheduled for 01/23/2020. 9) 01/30/2020: Resume dara/velcade/dex after resolution of diarrhea.  10) 02/13/2020: restaging labs show M protein 0.8, Kappa 4.5, lamba 17.2, ratio 0.26, urine M protein 53 (7.1%). All MM labs indicate improvement.  11) 03/10/2020: Cycle 4 Day 1 of Dara/Velcade/Dex. Transition to q 3 week daratumumab.  12) 06/04/2020:  Cycle 8 Day 1 of Dara/Velcade/Dex 13) 06/25/2020:  Cycle 9 Day 1 of Dara/Velcade/Dex 14) 07/22/2020: Cycle 10 Day 1 of  Dara//Dex 15) 08/18/2020: Cycle 11 Day 1 of Dara//Dex 16) 09/23/2020: Cycle 12 Day 1 of Dara//Dex 17) 10/22/2020: Cycle 13 Day 1 of Dara/Dex   18) 11/19/2020: Cycle 14 Day 1 of Dara/Dex  19) 12/17/2020: Cycle 15 Day 1 of Dara/Dex  20) 01/17/2021: Cycle 16 Day 1 of Dara/Dex  21) 02/11/2021: Cycle 17 Day 1 of Dara/Dex 22) 03/10/2021: Cycle 18 Day 1 of Dara/Dex  23) 04/08/2021: Cycle 19 Day 1 of Dara/Dex  24) 08/03/2021: Cycle 20 Day 1 of Dara/Dex (delayed due to scheduling error) 25) 09/02/2021: Cycle 21 Day 1 of Dara/Dex 26) 09/30/2021: Cycle 22 Day 1 of Dara/Dex 27) 10/28/2021: Cycle 23 Day 1 of Dara/Dex 28) 12/02/2021: Cycle 24 Day 1 of Dara/Dex 29) 12/30/2021: Cycle 25 Day 1 of Dara/Dex 30) 01/06/2022: Cycle 1 Day 1 of Kyprolis/Dex 31) 02/03/2022: Cycle 2 Day 1 of Kyprolis/Dex 32) 02/24/2022: Cycle 3 Day 1 of Kyprolis/Dex 33) 04/07/2022: Cycle 4 Day 1 of Kyprolis/Dex 34) 05/05/2022: Cycle 5 Day 1 of Kyprolis/Dex 35) 06/01/2022: Cycle 6 Day 1 of Kyprolis/Dex 36) 06/29/2022: Cycle 7 Day 1 of Kyprolis/Dex 37) 07/28/2022: Cycle 8 Day 1 of Kyprolis/Dex 38) 08/25/2022: Cycle 9 Day 1 of Kyprolis/Dex 39) 09/22/2022: Cycle 10 Day 1 of Kyprolis/Dex 40) 10/20/2022: Cycle 11 Day 1 of Kyprolis/Dex  Interval History:  [name] 67 y.o. male with medical history significant for  IgG Lambda Multiple Myeloma who presents for a follow up visit. The patient's last visit was on 10/06/2022.  He presents today to for cycle 11 day 1 of Kyprolis/Dex.   On exam today [name] reports he has been doing "all right" in the interim since our last visit.  He is tolerating the Kyprolis infusions well and did not appear to be causing any side effects or trouble.  He is not having any numbness or tingling of his fingers or toes, though he did have a slight issue with  2 of his fingers at 1 point.  This has subsequently resolved.  He does note in the interim since her last visit he underwent some sleep apnea testing and reports that he is having episodes where he stops breathing at night.  He reports he does not feel like it.  Overall he feels well rested.  He notes that he  does fall asleep easily though after eating.  He notes he is been trying to eat beets or to help build up his blood counts.  He is also disheartened by his increase in weight due to steroid therapy.  He reports that the buy some new clothing because of the increase in his size.  Otherwise he is at his baseline level of health today.  He denies fevers, chills, night sweats, shortness of breath, chest pain, cough, nausea, vomiting or abdominal.  He has no other complaints.  Rest of the 10 point ROS is below.  Overall he is willing and able to continue with Kyprolis treatment at this time.   MEDICAL HISTORY:  Past Medical History:  Diagnosis Date   Adrenal insufficiency ([place])    on chronic dexamethasone   Anemia    Cancer ([place])    Coagulopathy ([place])    on xeralto/ s/p DVT while on coumadin,  IVC in place   Diabetes mellitus without complication ([place])    type 2   Gross hematuria 7/14   post foley cath procedure   History of blood transfusion 7/14   Multiple myeloma    thoracic T8 with paraplegia s/p resection- on chemo at visit 10/13/10   Multiple myeloma    Multiple myeloma without mention of remission    Neurogenic bladder    Neurogenic bowel    Paraplegia ([place])    Partial small bowel obstruction ([place]) during dec 2011 admission    SURGICAL HISTORY: Past Surgical History:  Procedure Laterality Date   COLONOSCOPY WITH PROPOFOL N/A 04/12/2017   Procedure: COLONOSCOPY WITH PROPOFOL;  Surgeon: Irene Shipper, MD;  Location: [facility];  Service: Endoscopy;  Laterality: N/A;   COLONOSCOPY WITH PROPOFOL N/A 04/19/2017   Procedure: COLONOSCOPY WITH PROPOFOL;  Surgeon: Yetta Flock, MD;  Location: [facility];  Service: Gastroenterology;  Laterality: N/A;   COLOSTOMY  07/20/2011   Procedure: COLOSTOMY;  Surgeon: Judieth Keens, DO;  Location: [facility];  Service: General;;   COLOSTOMY REVISION  07/20/2011   Procedure: COLON RESECTION SIGMOID;  Surgeon: Judieth Keens, DO;   Location: [facility];  Service: General;;   CYSTOSCOPY N/A 04/04/2013   Procedure: CYSTOSCOPY WITH LITHALOPAXY;  Surgeon: Alexis Frock, MD;  Location: [facility];  Service: Urology;  Laterality: N/A;   INSERTION OF SUPRAPUBIC CATHETER N/A 04/04/2013   Procedure: INSERTION OF SUPRAPUBIC CATHETER;  Surgeon: Alexis Frock, MD;  Location: [facility];  Service: Urology;  Laterality: N/A;   LAPAROTOMY  07/20/2011   Procedure: EXPLORATORY LAPAROTOMY;  Surgeon: Judieth Keens, DO;  Location: [facility];  Service: General;  Laterality: N/A;   myeloma thoracic T8 with parpaplegia s/p thoracotomy and thoracic T7-9 cage placement on Dec 26th 2011  07/18/10    SOCIAL HISTORY: Social History   Socioeconomic History   Marital status: Married    Spouse name: Not on file   Number of children: Not on file   Years of education: Not on file   Highest education level: Not on file  Occupational History   Not on file  Tobacco Use   Smoking status: Never   Smokeless  tobacco: Never  Vaping Use   Vaping Use: Never used  Substance and Sexual Activity   Alcohol use: No   Drug use: No   Sexual activity: Never  Other Topics Concern   Not on file  Social History Narrative   Not on file   Social Determinants of Health   Financial Resource Strain: Not on file  Food Insecurity: Not on file  Transportation Needs: Not on file  Physical Activity: Not on file  Stress: Not on file  Social Connections: Not on file  Intimate Partner Violence: Not on file    FAMILY HISTORY: Family History  Problem Relation Age of Onset   Ovarian cancer Mother    Diabetes Father     ALLERGIES:  is allergic to ferumoxytol.  MEDICATIONS:  Current Outpatient Medications  Medication Sig Dispense Refill   acyclovir (ZOVIRAX) 400 MG tablet Take 1 tablet (400 mg total) by mouth 2 (two) times daily. 60 tablet 2   atorvastatin (LIPITOR) 10 MG tablet Take 10 mg by mouth daily.     baclofen (LIORESAL) 20 MG tablet Take 20 mg by mouth 2  (two) times daily.     diazepam (DIASTAT ACUDIAL) 10 MG GEL SMARTSIG:By Mouth     dorzolamide (TRUSOPT) 2 % ophthalmic solution 1 drop 2 (two) times daily.     iron polysaccharides (NU-IRON) 150 MG capsule Take 1 capsule (150 mg total) by mouth daily. 30 capsule 0   latanoprost (XALATAN) 0.005 % ophthalmic solution Place 1 drop into both eyes at bedtime.     ondansetron (ZOFRAN) 8 MG tablet Take 1 tablet (8 mg total) by mouth every 8 (eight) hours as needed for nausea or vomiting. 30 tablet 0   rivaroxaban (XARELTO) 10 MG TABS tablet Take 1 tablet (10 mg total) by mouth daily with supper. 30 tablet 9   Skin Protectants, Misc. (EUCERIN) cream Apply 1 application topically 2 (two) times daily as needed for dry skin.      zinc oxide (BALMEX) 11.3 % CREA cream Apply 1 application topically 2 (two) times daily.     No current facility-administered medications for this visit.   Facility-Administered Medications Ordered in Other Visits  Medication Dose Route Frequency Provider Last Rate Last Admin   sodium chloride flush (NS) 0.9 % injection 10 mL  10 mL Intravenous PRN Alvy Bimler, Ni, MD        REVIEW OF SYSTEMS:   Constitutional: ( - ) fevers, ( - )  chills , ( - ) night sweats Eyes: ( - ) blurriness of vision, ( - ) double vision, ( - ) watery eyes Ears, nose, mouth, throat, and face: ( - ) mucositis, ( - ) sore throat Respiratory: ( - ) cough, ( - ) dyspnea, ( - ) wheezes Cardiovascular: ( - ) palpitation, ( - ) chest discomfort, ( - ) lower extremity swelling Gastrointestinal:  ( - ) nausea, ( - ) heartburn, ( - ) change in bowel habits Skin: ( - ) abnormal skin rashes Lymphatics: ( - ) new lymphadenopathy, ( - ) easy bruising Neurological: ( - ) numbness, ( - ) tingling, ( - ) new weaknesses Behavioral/Psych: ( - ) mood change, ( - ) new changes  All other systems were reviewed with the patient and are negative.  PHYSICAL EXAMINATION: ECOG PERFORMANCE STATUS: paraplegic.   Vitals:    10/20/22 1045  BP: (!) 142/97  Pulse: 90  Resp: 18  Temp: 98.4 F (36.9 C)  SpO2: 100%  There were no vitals filed for this visit.   GENERAL: well appearing middle aged [place] American male alert, no distress and comfortable SKIN: skin color, texture, turgor are normal, no rashes or significant lesions EYES: conjunctiva are pink and non-injected, sclera clear LUNGS: clear to auscultation and percussion with normal breathing effort HEART: regular rate & rhythm and no murmurs Musculoskeletal: no cyanosis of digits and no clubbing  PSYCH: alert & oriented x 3, fluent speech NEURO: paraplegic, no use of LE bilaterally.   LABORATORY DATA:  I have reviewed the data as listed    Latest Ref Rng & Units 10/20/2022   10:13 AM 10/06/2022    9:23 AM 09/29/2022    9:33 AM  CBC  WBC 4.0 - 10.5 K/uL 7.1  6.4  7.2   Hemoglobin 13.0 - 17.0 g/dL 12.0  11.2  12.2   Hematocrit 39.0 - 52.0 % 35.7  32.8  35.8   Platelets 150 - 400 K/uL 482  198  225        Latest Ref Rng & Units 10/20/2022   10:13 AM 10/06/2022    9:23 AM 09/29/2022    9:30 AM  CMP  Glucose 70 - 99 mg/dL 124  112  123   BUN 8 - 23 mg/dL 10  13  14    Creatinine 0.61 - 1.24 mg/dL 0.70  0.70  0.74   Sodium 135 - 145 mmol/L 145  144  144   Potassium 3.5 - 5.1 mmol/L 3.9  3.4  3.6   Chloride 98 - 111 mmol/L 111  113  110   CO2 22 - 32 mmol/L 26  26  27    Calcium 8.9 - 10.3 mg/dL 9.4  8.9  9.6   Total Protein 6.5 - 8.1 g/dL 6.8  5.8  6.6   Total Bilirubin 0.3 - 1.2 mg/dL 0.6  0.6  0.6   Alkaline Phos 38 - 126 U/L 100  79  79   AST 15 - 41 U/L 11  11  12    ALT 0 - 44 U/L 16  15  14      Lab Results  Component Value Date   MPROTEIN 0.2 (H) 09/22/2022   MPROTEIN 0.2 (H) 08/25/2022   MPROTEIN 0.2 (H) 07/28/2022   Lab Results  Component Value Date   KPAFRELGTCHN 10.3 09/22/2022   KPAFRELGTCHN 17.1 08/25/2022   KPAFRELGTCHN 11.9 07/28/2022   LAMBDASER 9.9 09/22/2022   LAMBDASER 11.0 08/25/2022   LAMBDASER 9.2  07/28/2022   KAPLAMBRATIO 1.04 09/22/2022   KAPLAMBRATIO 1.55 08/25/2022   KAPLAMBRATIO 1.29 07/28/2022    RADIOGRAPHIC STUDIES: SLEEP STUDY DOCUMENTS  Result Date: 10/10/2022 Ordered by an unspecified provider.   ASSESSMENT & PLAN [name] 67 y.o. male with medical history significant for  IgG Lambda Multiple Myeloma who presents for a follow up visit.  After review of the labs, discussion with the patient, and reviewed the imaging his findings are most consistent with a relapsed multiple myeloma.The patient has had excellent success before with treatment of his myeloma with Velcade, Revlimid, and dexamethasone.  Transition to daratumumab therapy and was on a once monthly treatments but unfortunately developed progression of disease.  We have transitioned to Kyprolis/Dex.  # IgG Lambda Multiple Myeloma, Relapsed (ISS Stage II) --findings are most consistent with relapsed multiple myeloma. Patient previously successfully treated with Velcade/Rev/Dex and Daratumumab/Velcade/Dex. On 07/02/2020 he transitioned to monthly daratumumab alone.  --due to to rise in M protein, switched to Kyprolis and Dexamethasone on 01/06/2022  Plan: --Labs show white blood cell count 7.1, hemoglobin 12.0, MCV 80, and platelets of 482.  Additionally creatinine and LFTs are normal.  Myeloma labs from 08/25/2022 showed M protein dropped to 0.2 with normalization of kappa lambda ratio to 1.55 --patient will proceed with Cycle 11 Day 1 Kyprolis today.  --return for weekly Kyprolis and clinic visit  in 2 weeks.   #History of DVT --He had placement of IVC filter, remains on Xarelto. --Due to poor mobility, and lack of bleeding complications, I recommend he remain on Xarelto indefinitely. --caution if Plt count were to drop <50  #Snoring/Risk for OSA: --Scheduled for home sleep center to test for OSA end of this month. We will follow up based on results.   # Supportive Care -- provided patient with an albuterol  inhaler (for use with daratumumab) --acyclovir 400mg  BID for VZV prophylaxis --zofran 8mg  q8H PRN for nausea/vomiting  --Zometa is being held in the setting of his prior episode of osteonecrosis of the jaw  No orders of the defined types were placed in this encounter.   All questions were answered. The patient knows to call the clinic with any problems, questions or concerns.  I have spent a total of 30 minutes minutes of face-to-face and non-face-to-face time, preparing to see the patient,  performing a medically appropriate examination, counseling and educating the patient, communicating with other health care professionals, documenting clinical information in the electronic health record,  and care coordination.   Ledell Peoples, MD Department of Hematology/Oncology [facility] Phone: [phone] Pager: [phone] Email: [email or web address]   10/20/2022 11:13 AM   Literature Support:  Darrin Nipper, [place]rucci [place], [place], [place], [place], [place], Sp[place] S, [place], Ponticelli E, [place], [place] M, Di Toritto TC, Di Raimondo F, [place] V, Palumbo A, Boccadoro M, Lar[place]ca A. Once-weekly versus twice-weekly carfilzomib in patients with newly diagnosed multiple myeloma: a pooled analysis of two phase I/II studies. Haematologica. 2019 Aug;104(8):1640-1647.  --Once-weekly 70 mg/m2 carfilzomib as induction and maintenance therapy for newly diagnosed multiple myeloma patients was as safe and effective as twice-weekly 36 mg/m2 carfilzomib and provided a more convenient schedule.

## 2022-10-23 LAB — KAPPA/LAMBDA LIGHT CHAINS
Kappa free light chain: 12.4 mg/L (ref 3.3–19.4)
Kappa, lambda light chain ratio: 1.25 (ref 0.26–1.65)
Lambda free light chains: 9.9 mg/L (ref 5.7–26.3)

## 2022-10-25 LAB — MULTIPLE MYELOMA PANEL, SERUM
Albumin SerPl Elph-Mcnc: 3.4 g/dL (ref 2.9–4.4)
Albumin/Glob SerPl: 1.3 (ref 0.7–1.7)
Alpha 1: 0.3 g/dL (ref 0.0–0.4)
Alpha2 Glob SerPl Elph-Mcnc: 0.9 g/dL (ref 0.4–1.0)
B-Globulin SerPl Elph-Mcnc: 1 g/dL (ref 0.7–1.3)
Gamma Glob SerPl Elph-Mcnc: 0.6 g/dL (ref 0.4–1.8)
Globulin, Total: 2.8 g/dL (ref 2.2–3.9)
IgA: 41 mg/dL — ABNORMAL LOW (ref 61–437)
IgG (Immunoglobin G), Serum: 615 mg/dL (ref 603–1613)
IgM (Immunoglobulin M), Srm: 19 mg/dL — ABNORMAL LOW (ref 20–172)
M Protein SerPl Elph-Mcnc: 0.2 g/dL — ABNORMAL HIGH
Total Protein ELP: 6.2 g/dL (ref 6.0–8.5)

## 2022-10-26 NOTE — Progress Notes (Deleted)
Error- please disregard

## 2022-10-26 NOTE — Progress Notes (Signed)
This encounter was created in error - please disregard.

## 2022-10-27 ENCOUNTER — Inpatient Hospital Stay: Payer: Medicare (Managed Care) | Attending: Physician Assistant

## 2022-10-27 VITALS — BP 150/85 | HR 84 | Temp 97.8°F | Resp 18

## 2022-10-27 DIAGNOSIS — Z86718 Personal history of other venous thrombosis and embolism: Secondary | ICD-10-CM | POA: Insufficient documentation

## 2022-10-27 DIAGNOSIS — Z5111 Encounter for antineoplastic chemotherapy: Secondary | ICD-10-CM | POA: Insufficient documentation

## 2022-10-27 DIAGNOSIS — C9002 Multiple myeloma in relapse: Secondary | ICD-10-CM | POA: Insufficient documentation

## 2022-10-27 DIAGNOSIS — Z7901 Long term (current) use of anticoagulants: Secondary | ICD-10-CM | POA: Diagnosis not present

## 2022-10-27 LAB — CBC WITH DIFFERENTIAL (CANCER CENTER ONLY)
Abs Immature Granulocytes: 0.04 10*3/uL (ref 0.00–0.07)
Basophils Absolute: 0.1 10*3/uL (ref 0.0–0.1)
Basophils Relative: 1 %
Eosinophils Absolute: 0.5 10*3/uL (ref 0.0–0.5)
Eosinophils Relative: 6 %
HCT: 34.2 % — ABNORMAL LOW (ref 39.0–52.0)
Hemoglobin: 11.8 g/dL — ABNORMAL LOW (ref 13.0–17.0)
Immature Granulocytes: 0 %
Lymphocytes Relative: 11 %
Lymphs Abs: 1 10*3/uL (ref 0.7–4.0)
MCH: 27.3 pg (ref 26.0–34.0)
MCHC: 34.5 g/dL (ref 30.0–36.0)
MCV: 79 fL — ABNORMAL LOW (ref 80.0–100.0)
Monocytes Absolute: 0.9 10*3/uL (ref 0.1–1.0)
Monocytes Relative: 10 %
Neutro Abs: 6.5 10*3/uL (ref 1.7–7.7)
Neutrophils Relative %: 72 %
Platelet Count: 213 10*3/uL (ref 150–400)
RBC: 4.33 MIL/uL (ref 4.22–5.81)
RDW: 19.5 % — ABNORMAL HIGH (ref 11.5–15.5)
WBC Count: 9 10*3/uL (ref 4.0–10.5)
nRBC: 0 % (ref 0.0–0.2)

## 2022-10-27 LAB — LACTATE DEHYDROGENASE: LDH: 152 U/L (ref 98–192)

## 2022-10-27 LAB — CMP (CANCER CENTER ONLY)
ALT: 15 U/L (ref 0–44)
AST: 9 U/L — ABNORMAL LOW (ref 15–41)
Albumin: 3.7 g/dL (ref 3.5–5.0)
Alkaline Phosphatase: 88 U/L (ref 38–126)
Anion gap: 7 (ref 5–15)
BUN: 13 mg/dL (ref 8–23)
CO2: 27 mmol/L (ref 22–32)
Calcium: 9.8 mg/dL (ref 8.9–10.3)
Chloride: 111 mmol/L (ref 98–111)
Creatinine: 0.74 mg/dL (ref 0.61–1.24)
GFR, Estimated: >60 mL/min (ref >60)
Glucose, Bld: 111 mg/dL — ABNORMAL HIGH (ref 70–99)
Potassium: 3.9 mmol/L (ref 3.5–5.1)
Sodium: 145 mmol/L (ref 135–145)
Total Bilirubin: 0.8 mg/dL (ref 0.3–1.2)
Total Protein: 6.2 g/dL — ABNORMAL LOW (ref 6.5–8.1)

## 2022-10-27 LAB — URIC ACID: Uric Acid, Serum: 6.4 mg/dL (ref 3.7–8.6)

## 2022-10-27 MED ORDER — SODIUM CHLORIDE 0.9 % IV SOLN
40.0000 mg | Freq: Once | INTRAVENOUS | Status: AC
  Administered 2022-10-27: 40 mg via INTRAVENOUS
  Filled 2022-10-27: qty 4

## 2022-10-27 MED ORDER — DEXTROSE 5 % IV SOLN
70.0000 mg/m2 | Freq: Once | INTRAVENOUS | Status: AC
  Administered 2022-10-27: 150 mg via INTRAVENOUS
  Filled 2022-10-27: qty 60

## 2022-10-27 MED ORDER — SODIUM CHLORIDE 0.9 % IV SOLN: Freq: Once | INTRAVENOUS | Status: AC

## 2022-10-27 NOTE — Patient Instructions (Signed)
[facility]  Discharge Instructions: Thank you for choosing [facility] to provide your oncology and hematology care.   If you have a lab appointment with the Cancer Center, please go directly to the Cancer Center and check in at the registration area.   Wear comfortable clothing and clothing appropriate for easy access to any Portacath or PICC line.   We strive to give you quality time with your provider. You may need to reschedule your appointment if you arrive late (15 or more minutes).  Arriving late affects you and other patients whose appointments are after yours.  Also, if you miss three or more appointments without notifying the office, you may be dismissed from the clinic at the provider's discretion.      For prescription refill requests, have your pharmacy contact our office and allow 72 hours for refills to be completed.    Today you received the following chemotherapy and/or immunotherapy agents Carfilzomib      To help prevent nausea and vomiting after your treatment, we encourage you to take your nausea medication as directed.  BELOW ARE SYMPTOMS THAT SHOULD BE REPORTED IMMEDIATELY: *FEVER GREATER THAN 100.4 F (38 C) OR HIGHER *CHILLS OR SWEATING *NAUSEA AND VOMITING THAT IS NOT CONTROLLED WITH YOUR NAUSEA MEDICATION *UNUSUAL SHORTNESS OF BREATH *UNUSUAL BRUISING OR BLEEDING *URINARY PROBLEMS (pain or burning when urinating, or frequent urination) *BOWEL PROBLEMS (unusual diarrhea, constipation, pain near the anus) TENDERNESS IN MOUTH AND THROAT WITH OR WITHOUT PRESENCE OF ULCERS (sore throat, sores in mouth, or a toothache) UNUSUAL RASH, SWELLING OR PAIN  UNUSUAL VAGINAL DISCHARGE OR ITCHING   Items with * indicate a potential emergency and should be followed up as soon as possible or go to the Emergency Department if any problems should occur.  Please show the CHEMOTHERAPY ALERT CARD or IMMUNOTHERAPY ALERT CARD at  check-in to the Emergency Department and triage nurse.  Should you have questions after your visit or need to cancel or reschedule your appointment, please contact [facility]  Dept: [phone]  and follow the prompts.  Office hours are 8:00 a.m. to 4:30 p.m. Monday - Friday. Please note that voicemails left after 4:00 p.m. may not be returned until the following business day.  We are closed weekends and major holidays. You have access to a nurse at all times for urgent questions. Please call the main number to the clinic Dept: [phone] and follow the prompts.   For any non-urgent questions, you may also contact your provider using MyChart. We now offer e-Visits for anyone 18 and older to request care online for non-urgent symptoms. For details visit [email or web address].   Also download the MyChart app! Go to the app store, search "MyChart", open the app, select [facility], and log in with your MyChart username and password.   

## 2022-11-01 ENCOUNTER — Encounter: Payer: Self-pay | Admitting: Adult Health

## 2022-11-01 ENCOUNTER — Ambulatory Visit (INDEPENDENT_AMBULATORY_CARE_PROVIDER_SITE_OTHER): Payer: Medicare (Managed Care) | Admitting: Adult Health

## 2022-11-01 VITALS — BP 120/80 | HR 88 | Temp 97.9°F | Ht 68.0 in | Wt 240.0 lb

## 2022-11-01 DIAGNOSIS — G4733 Obstructive sleep apnea (adult) (pediatric): Secondary | ICD-10-CM | POA: Diagnosis not present

## 2022-11-01 HISTORY — DX: Obstructive sleep apnea (adult) (pediatric): G47.33

## 2022-11-01 NOTE — Assessment & Plan Note (Signed)
Encouraged on healthy weight loss 

## 2022-11-01 NOTE — Progress Notes (Signed)
Has had all the [place] vaccines. 

## 2022-11-01 NOTE — Assessment & Plan Note (Signed)
Severe obstructive sleep apnea.  Patient education was given on sleep apnea and treatment options including weight loss and CPAP therapy.  Patient will begin auto CPAP 5 to 20 cm H2O. - discussed how weight can impact sleep and risk for sleep disordered breathing - discussed options to assist with weight loss: combination of diet modification, cardiovascular and strength training exercises   - had an extensive discussion regarding the adverse health consequences related to untreated sleep disordered breathing - specifically discussed the risks for hypertension, coronary artery disease, cardiac dysrhythmias, cerebrovascular disease, and diabetes - lifestyle modification discussed   - discussed how sleep disruption can increase risk of accidents, particularly when driving - safe driving practices were discussed   Plan  Patient Instructions  Begin CPAP At bedtime, wear all night long for at least 6hr or more .  Healthy sleep regimen  Work on healthy weight.  Follow up in 3 months and As needed

## 2022-11-01 NOTE — Progress Notes (Signed)
@Patient  ID: [name], male    DOB: 05/27/1956, 67 y.o.   MRN: [id number]  Chief Complaint  Patient presents with   Follow-up    Referring provider: No ref. 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  Patient was seen last visit for sleep consult.  Patient had snoring, witnessed apneic events and daytime sleepiness.  Patient was set up for a home sleep study.  Home sleep study had to be done due to inability to complete in lab sleep study because sleep lab does not have a Hoyer lift.  Home sleep study was done on October 04, 2022 and showed severe sleep apnea with a AHI of 44.1/hour and minimum SpO2 at 75% with a mean oxygen level at 97%.  We discussed his sleep study results in detail went over treatment options including weight loss and CPAP therapy.  Allergies  Allergen Reactions   Ferumoxytol Nausea And Vomiting and Other (See Comments)    Felt like he was going to pass out.  chilling, Felt like he was going to pass out.    Immunization History  Administered Date(s) Administered   Influenza,inj,Quad PF,6+ Mos 04/20/2017   Influenza-Unspecified 04/23/2014, 05/03/2015, 04/23/2022   Td 04/23/2002   Zoster Recombinat (Shingrix) 03/04/2020    Past Medical History:  Diagnosis Date   Adrenal insufficiency    on chronic dexamethasone   Anemia    Cancer    Coagulopathy    on xeralto/ s/p DVT while on coumadin,  IVC in place   Diabetes mellitus without complication    type 2   Gross  hematuria 7/14   post foley cath procedure   History of blood transfusion 7/14   Multiple myeloma    thoracic T8 with paraplegia s/p resection- on chemo at visit 10/13/10   Multiple myeloma    Multiple myeloma without mention of remission    Neurogenic bladder    Neurogenic bowel    Paraplegia    Partial small bowel obstruction during dec 2011 admission    Tobacco History: Social History   Tobacco Use  Smoking Status Never  Smokeless Tobacco Never   Counseling given: Not Answered   Outpatient Medications Prior to Visit  Medication Sig Dispense Refill   acyclovir (ZOVIRAX) 400 MG tablet Take 1 tablet (400 mg total) by mouth 2 (two) times daily. 60 tablet 2   atorvastatin (LIPITOR) 10 MG tablet Take 10 mg by mouth daily.     baclofen (LIORESAL) 20 MG tablet Take 20 mg by mouth 2 (two) times daily.     diazepam (DIASTAT ACUDIAL) 10 MG GEL SMARTSIG:By Mouth     dorzolamide (TRUSOPT) 2 % ophthalmic solution 1 drop 2 (two) times daily.     iron polysaccharides (NU-IRON) 150 MG capsule Take 1 capsule (150 mg total) by mouth daily. 30 capsule 0   latanoprost (XALATAN) 0.005 % ophthalmic solution Place 1 drop into both eyes at bedtime.     ondansetron (ZOFRAN) 8 MG tablet Take 1 tablet (8 mg total) by mouth every 8 (eight) hours as needed for  nausea or vomiting. 30 tablet 0   rivaroxaban (XARELTO) 10 MG TABS tablet Take 1 tablet (10 mg total) by mouth daily with supper. 30 tablet 9   Skin Protectants, Misc. (EUCERIN) cream Apply 1 application topically 2 (two) times daily as needed for dry skin.      zinc oxide (BALMEX) 11.3 % CREA cream Apply 1 application topically 2 (two) times daily.     Facility-Administered Medications Prior to Visit  Medication Dose Route Frequency Provider Last Rate Last Admin   sodium chloride flush (NS) 0.9 % injection 10 mL  10 mL Intravenous PRN Bertis RuddyGorsuch, Ni, MD         Review of Systems:   Constitutional:   No  weight loss, night sweats,  Fevers,  chills, +fatigue, or  lassitude.     Physical Exam  BP 120/80 (BP Location: Right Arm, Patient Position: Sitting, Cuff Size: Large)   Pulse 88   Temp 97.9 F (36.6 C) (Oral)   Ht 5\' 8"  (1.727 m)   Wt 240 lb (108.9 kg)   SpO2 100%   BMI 36.49 kg/m   GEN: A/Ox3; pleasant , NAD, well nourished , in WC    HEENT:  [place]/AT,  , NOSE-clear, THROAT-clear, no lesions, no postnasal drip or exudate noted.  Class Howell-IV MP airway  NECK:  Supple w/ fair ROM; no JVD; normal carotid impulses w/o bruits; no thyromegaly or nodules palpated; no lymphadenopathy.    RESP  Clear  P & A; w/o, wheezes/ rales/ or rhonchi. no accessory muscle use, no dullness to percussion  CARD:  RRR, no m/r/g, no peripheral edema, pulses intact, no cyanosis or clubbing.  GI:   Soft & nt; nml bowel sounds; no organomegaly or masses detected.  Colostomy and suprapubic cath  [place]: Warm bil,  Neuro: alert, paraplegic  Skin: Warm, no lesions or rashes    Lab Results:     BNP No results found for: "BNP"  ProBNP No results found for: "PROBNP"  Imaging:        No data to display          No results found for: "NITRICOXIDE"      Assessment & Plan:   OSA (obstructive sleep apnea) Severe obstructive sleep apnea.  Patient education was given on sleep apnea and treatment options including weight loss and CPAP therapy.  Patient will begin auto CPAP 5 to 20 cm H2O. - discussed how weight can impact sleep and risk for sleep disordered breathing - discussed options to assist with weight loss: combination of diet modification, cardiovascular and strength training exercises   - had an extensive discussion regarding the adverse health consequences related to untreated sleep disordered breathing - specifically discussed the risks for hypertension, coronary artery disease, cardiac dysrhythmias, cerebrovascular disease, and diabetes - lifestyle modification discussed   - discussed how sleep disruption  can increase risk of accidents, particularly when driving - safe driving practices were discussed   Plan  Patient Instructions  Begin CPAP At bedtime, wear all night long for at least 6hr or more .  Healthy sleep regimen  Work on healthy weight.  Follow up in 3 months and As needed      OBESITY, NOS Encouraged on healthy weight loss     Rubye Oaksammy Karell Tukes, NP 11/01/2022

## 2022-11-01 NOTE — Patient Instructions (Signed)
Begin CPAP At bedtime, wear all night long for at least 6hr or more .  Healthy sleep regimen  Work on healthy weight.  Follow up in 3 months and As needed

## 2022-11-03 ENCOUNTER — Other Ambulatory Visit: Payer: Self-pay

## 2022-11-03 ENCOUNTER — Inpatient Hospital Stay: Payer: Medicare (Managed Care)

## 2022-11-03 ENCOUNTER — Inpatient Hospital Stay (HOSPITAL_BASED_OUTPATIENT_CLINIC_OR_DEPARTMENT_OTHER): Payer: Medicare (Managed Care) | Admitting: Hematology and Oncology

## 2022-11-03 VITALS — BP 136/72 | HR 68 | Temp 97.0°F | Resp 13 | Wt 255.2 lb

## 2022-11-03 DIAGNOSIS — C9002 Multiple myeloma in relapse: Secondary | ICD-10-CM

## 2022-11-03 DIAGNOSIS — Z7901 Long term (current) use of anticoagulants: Secondary | ICD-10-CM

## 2022-11-03 DIAGNOSIS — Z95828 Presence of other vascular implants and grafts: Secondary | ICD-10-CM

## 2022-11-03 DIAGNOSIS — Z5111 Encounter for antineoplastic chemotherapy: Secondary | ICD-10-CM

## 2022-11-03 LAB — CMP (CANCER CENTER ONLY)
ALT: 14 U/L (ref 0–44)
AST: 10 U/L — ABNORMAL LOW (ref 15–41)
Albumin: 3.6 g/dL (ref 3.5–5.0)
Alkaline Phosphatase: 90 U/L (ref 38–126)
Anion gap: 5 (ref 5–15)
BUN: 15 mg/dL (ref 8–23)
CO2: 27 mmol/L (ref 22–32)
Calcium: 9 mg/dL (ref 8.9–10.3)
Chloride: 112 mmol/L — ABNORMAL HIGH (ref 98–111)
Creatinine: 0.83 mg/dL (ref 0.61–1.24)
GFR, Estimated: >60 mL/min (ref >60)
Glucose, Bld: 103 mg/dL — ABNORMAL HIGH (ref 70–99)
Potassium: 3.6 mmol/L (ref 3.5–5.1)
Sodium: 144 mmol/L (ref 135–145)
Total Bilirubin: 0.7 mg/dL (ref 0.3–1.2)
Total Protein: 6 g/dL — ABNORMAL LOW (ref 6.5–8.1)

## 2022-11-03 LAB — CBC WITH DIFFERENTIAL (CANCER CENTER ONLY)
Abs Immature Granulocytes: 0.04 10*3/uL (ref 0.00–0.07)
Basophils Absolute: 0 10*3/uL (ref 0.0–0.1)
Basophils Relative: 0 %
Eosinophils Absolute: 0.4 10*3/uL (ref 0.0–0.5)
Eosinophils Relative: 5 %
HCT: 34.3 % — ABNORMAL LOW (ref 39.0–52.0)
Hemoglobin: 11.4 g/dL — ABNORMAL LOW (ref 13.0–17.0)
Immature Granulocytes: 1 %
Lymphocytes Relative: 10 %
Lymphs Abs: 0.8 10*3/uL (ref 0.7–4.0)
MCH: 26.8 pg (ref 26.0–34.0)
MCHC: 33.2 g/dL (ref 30.0–36.0)
MCV: 80.5 fL (ref 80.0–100.0)
Monocytes Absolute: 0.8 10*3/uL (ref 0.1–1.0)
Monocytes Relative: 10 %
Neutro Abs: 5.8 10*3/uL (ref 1.7–7.7)
Neutrophils Relative %: 74 %
Platelet Count: 206 10*3/uL (ref 150–400)
RBC: 4.26 MIL/uL (ref 4.22–5.81)
RDW: 19.9 % — ABNORMAL HIGH (ref 11.5–15.5)
WBC Count: 7.8 10*3/uL (ref 4.0–10.5)
nRBC: 0.3 % — ABNORMAL HIGH (ref 0.0–0.2)

## 2022-11-03 LAB — URIC ACID: Uric Acid, Serum: 6.3 mg/dL (ref 3.7–8.6)

## 2022-11-03 LAB — LACTATE DEHYDROGENASE: LDH: 153 U/L (ref 98–192)

## 2022-11-03 MED ORDER — SODIUM CHLORIDE 0.9 % IV SOLN
40.0000 mg | Freq: Once | INTRAVENOUS | Status: AC
  Administered 2022-11-03: 40 mg via INTRAVENOUS
  Filled 2022-11-03: qty 4

## 2022-11-03 MED ORDER — HEPARIN SOD (PORK) LOCK FLUSH 100 UNIT/ML IV SOLN: 500.0000 [IU] | Freq: Once | INTRAVENOUS | Status: DC | PRN

## 2022-11-03 MED ORDER — SODIUM CHLORIDE 0.9 % IV SOLN: Freq: Once | INTRAVENOUS | Status: AC

## 2022-11-03 MED ORDER — DEXTROSE 5 % IV SOLN
70.0000 mg/m2 | Freq: Once | INTRAVENOUS | Status: AC
  Administered 2022-11-03: 150 mg via INTRAVENOUS
  Filled 2022-11-03: qty 60

## 2022-11-03 MED ORDER — SODIUM CHLORIDE 0.9% FLUSH: 10.0000 mL | INTRAVENOUS | Status: DC | PRN

## 2022-11-03 NOTE — Patient Instructions (Signed)
[facility]  Discharge Instructions: Thank you for choosing [facility] to provide your oncology and hematology care.   If you have a lab appointment with the Cancer Center, please go directly to the Cancer Center and check in at the registration area.   Wear comfortable clothing and clothing appropriate for easy access to any Portacath or PICC line.   We strive to give you quality time with your provider. You may need to reschedule your appointment if you arrive late (15 or more minutes).  Arriving late affects you and other patients whose appointments are after yours.  Also, if you miss three or more appointments without notifying the office, you may be dismissed from the clinic at the provider's discretion.      For prescription refill requests, have your pharmacy contact our office and allow 72 hours for refills to be completed.    Today you received the following chemotherapy and/or immunotherapy agents :  Kyprolis   To help prevent nausea and vomiting after your treatment, we encourage you to take your nausea medication as directed.  BELOW ARE SYMPTOMS THAT SHOULD BE REPORTED IMMEDIATELY: *FEVER GREATER THAN 100.4 F (38 C) OR HIGHER *CHILLS OR SWEATING *NAUSEA AND VOMITING THAT IS NOT CONTROLLED WITH YOUR NAUSEA MEDICATION *UNUSUAL SHORTNESS OF BREATH *UNUSUAL BRUISING OR BLEEDING *URINARY PROBLEMS (pain or burning when urinating, or frequent urination) *BOWEL PROBLEMS (unusual diarrhea, constipation, pain near the anus) TENDERNESS IN MOUTH AND THROAT WITH OR WITHOUT PRESENCE OF ULCERS (sore throat, sores in mouth, or a toothache) UNUSUAL RASH, SWELLING OR PAIN  UNUSUAL VAGINAL DISCHARGE OR ITCHING   Items with * indicate a potential emergency and should be followed up as soon as possible or go to the Emergency Department if any problems should occur.  Please show the CHEMOTHERAPY ALERT CARD or IMMUNOTHERAPY ALERT CARD at  check-in to the Emergency Department and triage nurse.  Should you have questions after your visit or need to cancel or reschedule your appointment, please contact [facility]  Dept: [phone]  and follow the prompts.  Office hours are 8:00 a.m. to 4:30 p.m. Monday - Friday. Please note that voicemails left after 4:00 p.m. may not be returned until the following business day.  We are closed weekends and major holidays. You have access to a nurse at all times for urgent questions. Please call the main number to the clinic Dept: [phone] and follow the prompts.   For any non-urgent questions, you may also contact your provider using MyChart. We now offer e-Visits for anyone 18 and older to request care online for non-urgent symptoms. For details visit [email or web address].   Also download the MyChart app! Go to the app store, search "MyChart", open the app, select [facility], and log in with your MyChart username and password.   

## 2022-11-03 NOTE — Progress Notes (Unsigned)
[facility] [phone]   [phone]  PROGRESS NOTE  Patient Care Team: Jethro Bastos, MD as PCP - General (Family Medicine) Ranelle Oyster, MD as Consulting Physician (Physical Medicine and Rehabilitation) Hildred Alamin Mcarthur Rossetti, PA-C as Physician Assistant (Physical Medicine and Rehabilitation) Jaci Standard, MD as Consulting Physician (Hematology and Oncology)  Hematological/Oncological History # IgG Lambda Multiple Myeloma, Relapsed (ISS Stage II) 1) 06/2010: initial diagnosis of Multiple Myeloma after T8 compression fracture. Treated with Velcade/Revlimid/Dexamethasone and achieved a complete remission 2) Velcade was discontinued in September 2012 and that Revlimid and Decadron were discontinued in March 2013. 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 4) Followed by Dr. Bertis Ruddy, last clinic visit 10/09/2019. At that time there was concern for relapse of his multiple myeloma.  5) Patient requested transfer to different provider after misunderstanding regarding imaging studies 6) 12/17/2019: transfer care to Dr. Leonides Schanz  7) 01/09/2020: Cycle 1 Day 1 of Dara/Velcade/Dex 8) 01/21/2020: presented as urgent visit for diarrhea and dehydration. Holding chemotherapy scheduled for 01/23/2020. 9) 01/30/2020: Resume dara/velcade/dex after resolution of diarrhea.  10) 02/13/2020: restaging labs show M protein 0.8, Kappa 4.5, lamba 17.2, ratio 0.26, urine M protein 53 (7.1%). All MM labs indicate improvement.  11) 03/10/2020: Cycle 4 Day 1 of Dara/Velcade/Dex. Transition to q 3 week daratumumab.  12) 06/04/2020:  Cycle 8 Day 1 of Dara/Velcade/Dex 13) 06/25/2020:  Cycle 9 Day 1 of Dara/Velcade/Dex 14) 07/22/2020: Cycle 10 Day 1 of  Dara//Dex 15) 08/18/2020: Cycle 11 Day 1 of Dara//Dex 16) 09/23/2020: Cycle 12 Day 1 of Dara//Dex 17) 10/22/2020: Cycle 13 Day 1 of Dara/Dex   18) 11/19/2020: Cycle 14 Day 1 of Dara/Dex  19) 12/17/2020: Cycle 15 Day 1 of Dara/Dex  20) 01/17/2021: Cycle 16 Day 1 of Dara/Dex  21) 02/11/2021: Cycle 17 Day 1 of Dara/Dex 22) 03/10/2021: Cycle 18 Day 1 of Dara/Dex  23) 04/08/2021: Cycle 19 Day 1 of Dara/Dex  24) 08/03/2021: Cycle 20 Day 1 of Dara/Dex (delayed due to scheduling error) 25) 09/02/2021: Cycle 21 Day 1 of Dara/Dex 26) 09/30/2021: Cycle 22 Day 1 of Dara/Dex 27) 10/28/2021: Cycle 23 Day 1 of Dara/Dex 28) 12/02/2021: Cycle 24 Day 1 of Dara/Dex 29) 12/30/2021: Cycle 25 Day 1 of Dara/Dex 30) 01/06/2022: Cycle 1 Day 1 of Kyprolis/Dex 31) 02/03/2022: Cycle 2 Day 1 of Kyprolis/Dex 32) 02/24/2022: Cycle 3 Day 1 of Kyprolis/Dex 33) 04/07/2022: Cycle 4 Day 1 of Kyprolis/Dex 34) 05/05/2022: Cycle 5 Day 1 of Kyprolis/Dex 35) 06/01/2022: Cycle 6 Day 1 of Kyprolis/Dex 36) 06/29/2022: Cycle 7 Day 1 of Kyprolis/Dex 37) 07/28/2022: Cycle 8 Day 1 of Kyprolis/Dex 38) 08/25/2022: Cycle 9 Day 1 of Kyprolis/Dex 39) 09/22/2022: Cycle 10 Day 1 of Kyprolis/Dex 40) 10/20/2022: Cycle 11 Day 1 of Kyprolis/Dex  Interval History:  [name] 67 y.o. male with medical history significant for  IgG Lambda Multiple Myeloma who presents for a follow up visit. The patient's last visit was on 10/20/2022.  He presents today to for cycle 11 day 15 of Kyprolis/Dex.   On exam today [name] reports he has been well overall interim since her last visit.  He continues to undergo evaluation for sleep apnea and recently underwent a home test.  He notes his energy levels have been okay and he has not been having any trouble with sleepiness.  He is disheartened by the continued increase of his weight.  He notes he is drinking plenty of water and his appetite remains good.  He denies fevers, chills, night sweats, shortness of breath, chest pain, cough, nausea, vomiting or abdominal.  He has no other complaints.  Rest of the 10 point ROS is below.  Overall he is willing and able to  continue with Kyprolis treatment at this time.   MEDICAL HISTORY:  Past Medical History:  Diagnosis Date   Adrenal insufficiency    on chronic dexamethasone   Anemia    Cancer    Coagulopathy    on xeralto/ s/p DVT while on coumadin,  IVC in place   Diabetes mellitus without complication    type 2   Gross hematuria 7/14   post foley cath procedure   History of blood transfusion 7/14   Multiple myeloma    thoracic T8 with paraplegia s/p resection- on chemo at visit 10/13/10   Multiple myeloma    Multiple myeloma without mention of remission    Neurogenic bladder    Neurogenic bowel    Paraplegia    Partial small bowel obstruction during dec 2011 admission    SURGICAL HISTORY: Past Surgical History:  Procedure Laterality Date   COLONOSCOPY WITH PROPOFOL N/A 04/12/2017   Procedure: COLONOSCOPY WITH PROPOFOL;  Surgeon: Hilarie Fredrickson, MD;  Location: [facility];  Service: Endoscopy;  Laterality: N/A;   COLONOSCOPY WITH PROPOFOL N/A 04/19/2017   Procedure: COLONOSCOPY WITH PROPOFOL;  Surgeon: Benancio Deeds, MD;  Location: [facility];  Service: Gastroenterology;  Laterality: N/A;   COLOSTOMY  07/20/2011   Procedure: COLOSTOMY;  Surgeon: Rulon Abide, DO;  Location: [facility] OR;  Service: General;;   COLOSTOMY REVISION  07/20/2011   Procedure: COLON RESECTION SIGMOID;  Surgeon: Rulon Abide, DO;  Location: [facility] OR;  Service: General;;   CYSTOSCOPY N/A 04/04/2013   Procedure: CYSTOSCOPY WITH LITHALOPAXY;  Surgeon: Sebastian Ache, MD;  Location: [facility];  Service: Urology;  Laterality: N/A;   INSERTION OF SUPRAPUBIC CATHETER N/A 04/04/2013   Procedure: INSERTION OF SUPRAPUBIC CATHETER;  Surgeon: Sebastian Ache, MD;  Location: [facility];  Service: Urology;  Laterality: N/A;   LAPAROTOMY  07/20/2011   Procedure: EXPLORATORY LAPAROTOMY;  Surgeon: Rulon Abide, DO;  Location: [facility] OR;  Service: General;  Laterality: N/A;   myeloma thoracic T8 with parpaplegia s/p thoracotomy  and thoracic T7-9 cage placement on Dec 26th 2011  07/18/10    SOCIAL HISTORY: Social History   Socioeconomic History   Marital status: Married    Spouse name: Not on file   Number of children: Not on file   Years of education: Not on file   Highest education level: Not on file  Occupational History   Not on file  Tobacco Use   Smoking status: Never   Smokeless tobacco: Never  Vaping Use   Vaping Use: Never used  Substance and Sexual Activity   Alcohol use: No   Drug use: No   Sexual activity: Never  Other Topics Concern   Not on file  Social History Narrative   Not on file   Social Determinants of Health   Financial Resource Strain: Not on file  Food Insecurity: Not on file  Transportation Needs: Not on file  Physical Activity: Not on file  Stress: Not on file  Social Connections: Not on file  Intimate Partner Violence: Not on file    FAMILY HISTORY: Family History  Problem Relation Age of Onset   Ovarian cancer Mother    Diabetes  Father     ALLERGIES:  is allergic to ferumoxytol.  MEDICATIONS:  Current Outpatient Medications  Medication Sig Dispense Refill   acyclovir (ZOVIRAX) 400 MG tablet Take 1 tablet (400 mg total) by mouth 2 (two) times daily. 60 tablet 2   atorvastatin (LIPITOR) 10 MG tablet Take 10 mg by mouth daily.     baclofen (LIORESAL) 20 MG tablet Take 20 mg by mouth 2 (two) times daily.     diazepam (DIASTAT ACUDIAL) 10 MG GEL SMARTSIG:By Mouth     dorzolamide (TRUSOPT) 2 % ophthalmic solution 1 drop 2 (two) times daily.     iron polysaccharides (NU-IRON) 150 MG capsule Take 1 capsule (150 mg total) by mouth daily. 30 capsule 0   latanoprost (XALATAN) 0.005 % ophthalmic solution Place 1 drop into both eyes at bedtime.     ondansetron (ZOFRAN) 8 MG tablet Take 1 tablet (8 mg total) by mouth every 8 (eight) hours as needed for nausea or vomiting. 30 tablet 0   rivaroxaban (XARELTO) 10 MG TABS tablet Take 1 tablet (10 mg total) by mouth daily  with supper. 30 tablet 9   Skin Protectants, Misc. (EUCERIN) cream Apply 1 application topically 2 (two) times daily as needed for dry skin.      zinc oxide (BALMEX) 11.3 % CREA cream Apply 1 application topically 2 (two) times daily.     No current facility-administered medications for this visit.   Facility-Administered Medications Ordered in Other Visits  Medication Dose Route Frequency Provider Last Rate Last Admin   sodium chloride flush (NS) 0.9 % injection 10 mL  10 mL Intravenous PRN Bertis Ruddy, Ni, MD        REVIEW OF SYSTEMS:   Constitutional: ( - ) fevers, ( - )  chills , ( - ) night sweats Eyes: ( - ) blurriness of vision, ( - ) double vision, ( - ) watery eyes Ears, nose, mouth, throat, and face: ( - ) mucositis, ( - ) sore throat Respiratory: ( - ) cough, ( - ) dyspnea, ( - ) wheezes Cardiovascular: ( - ) palpitation, ( - ) chest discomfort, ( - ) lower extremity swelling Gastrointestinal:  ( - ) nausea, ( - ) heartburn, ( - ) change in bowel habits Skin: ( - ) abnormal skin rashes Lymphatics: ( - ) new lymphadenopathy, ( - ) easy bruising Neurological: ( - ) numbness, ( - ) tingling, ( - ) new weaknesses Behavioral/Psych: ( - ) mood change, ( - ) new changes  All other systems were reviewed with the patient and are negative.  PHYSICAL EXAMINATION: ECOG PERFORMANCE STATUS: paraplegic.   Vitals:   11/03/22 0952  BP: 136/72  Pulse: 68  Resp: 13  Temp: (!) 97 F (36.1 C)  SpO2: 100%      Filed Weights   11/03/22 0952  Weight: 255 lb 3.2 oz (115.8 kg)     GENERAL: well appearing middle aged [place] American male alert, no distress and comfortable SKIN: skin color, texture, turgor are normal, no rashes or significant lesions EYES: conjunctiva are pink and non-injected, sclera clear LUNGS: clear to auscultation and percussion with normal breathing effort HEART: regular rate & rhythm and no murmurs Musculoskeletal: no cyanosis of digits and no clubbing  PSYCH:  alert & oriented x 3, fluent speech NEURO: paraplegic, no use of LE bilaterally.   LABORATORY DATA:  I have reviewed the data as listed    Latest Ref Rng & Units 11/03/2022    9:20  AM 10/27/2022    9:55 AM 10/20/2022   10:13 AM  CBC  WBC 4.0 - 10.5 K/uL 7.8  9.0  7.1   Hemoglobin 13.0 - 17.0 g/dL 16.1  09.6  04.5   Hematocrit 39.0 - 52.0 % 34.3  34.2  35.7   Platelets 150 - 400 K/uL 206  213  482        Latest Ref Rng & Units 11/03/2022    9:20 AM 10/27/2022    9:55 AM 10/20/2022   10:13 AM  CMP  Glucose 70 - 99 mg/dL 409  811  914   BUN 8 - 23 mg/dL 15  13  10    Creatinine 0.61 - 1.24 mg/dL 7.82  9.56  2.13   Sodium 135 - 145 mmol/L 144  145  145   Potassium 3.5 - 5.1 mmol/L 3.6  3.9  3.9   Chloride 98 - 111 mmol/L 112  111  111   CO2 22 - 32 mmol/L 27  27  26    Calcium 8.9 - 10.3 mg/dL 9.0  9.8  9.4   Total Protein 6.5 - 8.1 g/dL 6.0  6.2  6.8   Total Bilirubin 0.3 - 1.2 mg/dL 0.7  0.8  0.6   Alkaline Phos 38 - 126 U/L 90  88  100   AST 15 - 41 U/L 10  9  11    ALT 0 - 44 U/L 14  15  16      Lab Results  Component Value Date   MPROTEIN 0.2 (H) 10/20/2022   MPROTEIN 0.2 (H) 09/22/2022   MPROTEIN 0.2 (H) 08/25/2022   Lab Results  Component Value Date   KPAFRELGTCHN 12.4 10/20/2022   KPAFRELGTCHN 10.3 09/22/2022   KPAFRELGTCHN 17.1 08/25/2022   LAMBDASER 9.9 10/20/2022   LAMBDASER 9.9 09/22/2022   LAMBDASER 11.0 08/25/2022   KAPLAMBRATIO 1.25 10/20/2022   KAPLAMBRATIO 1.04 09/22/2022   KAPLAMBRATIO 1.55 08/25/2022    RADIOGRAPHIC STUDIES: SLEEP STUDY DOCUMENTS  Result Date: 10/10/2022 Ordered by an unspecified provider.   ASSESSMENT & PLAN [name] 67 y.o. male with medical history significant for  IgG Lambda Multiple Myeloma who presents for a follow up visit.  After review of the labs, discussion with the patient, and reviewed the imaging his findings are most consistent with a relapsed multiple myeloma.The patient has had excellent success before with  treatment of his myeloma with Velcade, Revlimid, and dexamethasone.  Transition to daratumumab therapy and was on a once monthly treatments but unfortunately developed progression of disease.  We have transitioned to Kyprolis/Dex.  # IgG Lambda Multiple Myeloma, Relapsed (ISS Stage II) --findings are most consistent with relapsed multiple myeloma. Patient previously successfully treated with Velcade/Rev/Dex and Daratumumab/Velcade/Dex. On 07/02/2020 he transitioned to monthly daratumumab alone.  --due to to rise in M protein, switched to Kyprolis and Dexamethasone on 01/06/2022 Plan: --Labs show white blood cell count 7.8, hemoglobin 11.4, MCV 80.5, and platelets of 206. additionally creatinine and LFTs are normal.  Myeloma labs from 08/25/2022 showed M protein dropped to 0.2 with normalization of kappa lambda ratio to 1.25 --patient will proceed with Cycle 11 Day 15 Kyprolis today.  --return for weekly Kyprolis and clinic visit  in 2 weeks.   #History of DVT --He had placement of IVC filter, remains on Xarelto. --Due to poor mobility, and lack of bleeding complications, I recommend he remain on Xarelto indefinitely. --caution if Plt count were to drop <50  #Snoring/Risk for OSA: --Scheduled for home sleep center  to test for OSA end of this month. We will follow up based on results.   # Supportive Care -- provided patient with an albuterol inhaler (for use with daratumumab) --acyclovir 400mg  BID for VZV prophylaxis --zofran 8mg  q8H PRN for nausea/vomiting  --Zometa is being held in the setting of his prior episode of osteonecrosis of the jaw  Orders Placed This Encounter  Procedures   Multiple Myeloma Panel (SPEP&IFE w/QIG)    Standing Status:   Future    Standing Expiration Date:   12/15/2023   Kappa/lambda light chains    Standing Status:   Future    Standing Expiration Date:   12/15/2023   CBC with Differential (Cancer Center Only)    Standing Status:   Future    Standing Expiration  Date:   12/15/2023   CMP (Cancer Center only)    Standing Status:   Future    Standing Expiration Date:   12/15/2023   CBC with Differential (Cancer Center Only)    Standing Status:   Future    Standing Expiration Date:   12/22/2023   CMP (Cancer Center only)    Standing Status:   Future    Standing Expiration Date:   12/22/2023   CBC with Differential (Cancer Center Only)    Standing Status:   Future    Standing Expiration Date:   12/29/2023   CMP (Cancer Center only)    Standing Status:   Future    Standing Expiration Date:   12/29/2023   Multiple Myeloma Panel (SPEP&IFE w/QIG)    Standing Status:   Future    Standing Expiration Date:   01/12/2024   Kappa/lambda light chains    Standing Status:   Future    Standing Expiration Date:   01/12/2024   CBC with Differential (Cancer Center Only)    Standing Status:   Future    Standing Expiration Date:   01/12/2024   CMP (Cancer Center only)    Standing Status:   Future    Standing Expiration Date:   01/12/2024   CBC with Differential (Cancer Center Only)    Standing Status:   Future    Standing Expiration Date:   01/19/2024   CMP (Cancer Center only)    Standing Status:   Future    Standing Expiration Date:   01/19/2024   CBC with Differential (Cancer Center Only)    Standing Status:   Future    Standing Expiration Date:   01/26/2024   CMP (Cancer Center only)    Standing Status:   Future    Standing Expiration Date:   01/26/2024   Multiple Myeloma Panel (SPEP&IFE w/QIG)    Standing Status:   Future    Standing Expiration Date:   02/09/2024   Kappa/lambda light chains    Standing Status:   Future    Standing Expiration Date:   02/09/2024   CBC with Differential (Cancer Center Only)    Standing Status:   Future    Standing Expiration Date:   02/09/2024   CMP (Cancer Center only)    Standing Status:   Future    Standing Expiration Date:   02/09/2024   CBC with Differential (Cancer Center Only)    Standing Status:   Future    Standing  Expiration Date:   02/16/2024   CMP (Cancer Center only)    Standing Status:   Future    Standing Expiration Date:   02/16/2024   CBC with Differential (Cancer Center Only)  Standing Status:   Future    Standing Expiration Date:   02/23/2024   CMP (Cancer Center only)    Standing Status:   Future    Standing Expiration Date:   02/23/2024    All questions were answered. The patient knows to call the clinic with any problems, questions or concerns.  I have spent a total of 25 minutes minutes of face-to-face and non-face-to-face time, preparing to see the patient,  performing a medically appropriate examination, counseling and educating the patient, communicating with other health care professionals, documenting clinical information in the electronic health record,  and care coordination.   Ulysees Barns, MD Department of Hematology/Oncology [facility] Phone: [phone] Pager: [phone] Email: [email or web address]   11/04/2022 5:02 PM   Literature Support:  Milagros Loll, Petrucci MT, [place], [place], [place], [place], Spada S, [place], Ponticelli E, [place], Cavo M, Di Toritto TC, Haydee Salter F, [place] V, Palumbo A, Boccadoro M, Larocca A. Once-weekly versus twice-weekly carfilzomib in patients with newly diagnosed multiple myeloma: a pooled analysis of two phase I/II studies. Haematologica. 2019 Aug;104(8):1640-1647.  --Once-weekly 70 mg/m2 carfilzomib as induction and maintenance therapy for newly diagnosed multiple myeloma patients was as safe and effective as twice-weekly 36 mg/m2 carfilzomib and provided a more convenient schedule.

## 2022-11-04 ENCOUNTER — Encounter: Payer: Self-pay | Admitting: Hematology and Oncology

## 2022-11-08 ENCOUNTER — Telehealth: Payer: Self-pay | Admitting: Adult Health

## 2022-11-08 NOTE — Telephone Encounter (Signed)
Nurse Practitioner Tonye Becket @ [phone] calling.  She wonders if we can send the Cpap Equip RX to [facility] so his insurance will cover it. Please let her know if you have any questions. Thanks.

## 2022-11-08 NOTE — Telephone Encounter (Signed)
Called Nurse Practitioner Tonye Becket had to leave message. Order for CPAP was placed on 11/01/22. Nothing further needed.

## 2022-11-13 ENCOUNTER — Telehealth: Payer: Self-pay | Admitting: Hematology and Oncology

## 2022-11-13 NOTE — Telephone Encounter (Signed)
Reached out to patient to schedule no answer, will try again at a later time.

## 2022-11-17 ENCOUNTER — Other Ambulatory Visit: Payer: Self-pay

## 2022-11-17 ENCOUNTER — Inpatient Hospital Stay: Payer: Medicare (Managed Care)

## 2022-11-17 ENCOUNTER — Inpatient Hospital Stay (HOSPITAL_BASED_OUTPATIENT_CLINIC_OR_DEPARTMENT_OTHER): Payer: Medicare (Managed Care) | Admitting: Hematology and Oncology

## 2022-11-17 VITALS — BP 139/80 | HR 71 | Resp 16

## 2022-11-17 VITALS — BP 139/85 | HR 77 | Temp 97.3°F | Resp 14 | Wt 253.0 lb

## 2022-11-17 DIAGNOSIS — Z7901 Long term (current) use of anticoagulants: Secondary | ICD-10-CM | POA: Diagnosis not present

## 2022-11-17 DIAGNOSIS — Z5111 Encounter for antineoplastic chemotherapy: Secondary | ICD-10-CM

## 2022-11-17 DIAGNOSIS — C9002 Multiple myeloma in relapse: Secondary | ICD-10-CM | POA: Diagnosis not present

## 2022-11-17 LAB — CBC WITH DIFFERENTIAL (CANCER CENTER ONLY)
Abs Immature Granulocytes: 0.03 10*3/uL (ref 0.00–0.07)
Basophils Absolute: 0 10*3/uL (ref 0.0–0.1)
Basophils Relative: 1 %
Eosinophils Absolute: 0.3 10*3/uL (ref 0.0–0.5)
Eosinophils Relative: 5 %
HCT: 34.9 % — ABNORMAL LOW (ref 39.0–52.0)
Hemoglobin: 11.6 g/dL — ABNORMAL LOW (ref 13.0–17.0)
Immature Granulocytes: 1 %
Lymphocytes Relative: 13 %
Lymphs Abs: 0.9 10*3/uL (ref 0.7–4.0)
MCH: 26.9 pg (ref 26.0–34.0)
MCHC: 33.2 g/dL (ref 30.0–36.0)
MCV: 80.8 fL (ref 80.0–100.0)
Monocytes Absolute: 0.7 10*3/uL (ref 0.1–1.0)
Monocytes Relative: 10 %
Neutro Abs: 4.7 10*3/uL (ref 1.7–7.7)
Neutrophils Relative %: 70 %
Platelet Count: 433 10*3/uL — ABNORMAL HIGH (ref 150–400)
RBC: 4.32 MIL/uL (ref 4.22–5.81)
RDW: 19.2 % — ABNORMAL HIGH (ref 11.5–15.5)
WBC Count: 6.7 10*3/uL (ref 4.0–10.5)
nRBC: 0 % (ref 0.0–0.2)

## 2022-11-17 LAB — CMP (CANCER CENTER ONLY)
ALT: 13 U/L (ref 0–44)
AST: 12 U/L — ABNORMAL LOW (ref 15–41)
Albumin: 3.8 g/dL (ref 3.5–5.0)
Alkaline Phosphatase: 88 U/L (ref 38–126)
Anion gap: 5 (ref 5–15)
BUN: 9 mg/dL (ref 8–23)
CO2: 29 mmol/L (ref 22–32)
Calcium: 9.5 mg/dL (ref 8.9–10.3)
Chloride: 110 mmol/L (ref 98–111)
Creatinine: 0.77 mg/dL (ref 0.61–1.24)
GFR, Estimated: >60 mL/min (ref >60)
Glucose, Bld: 115 mg/dL — ABNORMAL HIGH (ref 70–99)
Potassium: 3.8 mmol/L (ref 3.5–5.1)
Sodium: 144 mmol/L (ref 135–145)
Total Bilirubin: 0.7 mg/dL (ref 0.3–1.2)
Total Protein: 6.3 g/dL — ABNORMAL LOW (ref 6.5–8.1)

## 2022-11-17 MED ORDER — SODIUM CHLORIDE 0.9 % IV SOLN: Freq: Once | INTRAVENOUS | Status: AC

## 2022-11-17 MED ORDER — SODIUM CHLORIDE 0.9 % IV SOLN
40.0000 mg | Freq: Once | INTRAVENOUS | Status: AC
  Administered 2022-11-17: 40 mg via INTRAVENOUS
  Filled 2022-11-17: qty 4

## 2022-11-17 MED ORDER — DEXTROSE 5 % IV SOLN
70.0000 mg/m2 | Freq: Once | INTRAVENOUS | Status: AC
  Administered 2022-11-17: 150 mg via INTRAVENOUS
  Filled 2022-11-17: qty 60

## 2022-11-17 NOTE — Patient Instructions (Signed)
[facility]   Discharge Instructions: Thank you for choosing [facility] to provide your oncology and hematology care.   If you have a lab appointment with the Cancer Center, please go directly to the Cancer Center and check in at the registration area.   Wear comfortable clothing and clothing appropriate for easy access to any Portacath or PICC line.   We strive to give you quality time with your provider. You may need to reschedule your appointment if you arrive late (15 or more minutes).  Arriving late affects you and other patients whose appointments are after yours.  Also, if you miss three or more appointments without notifying the office, you may be dismissed from the clinic at the provider's discretion.      For prescription refill requests, have your pharmacy contact our office and allow 72 hours for refills to be completed.    Today you received the following chemotherapy and/or immunotherapy agents: Carfilzomib (Kyprolis)       To help prevent nausea and vomiting after your treatment, we encourage you to take your nausea medication as directed.  BELOW ARE SYMPTOMS THAT SHOULD BE REPORTED IMMEDIATELY: *FEVER GREATER THAN 100.4 F (38 C) OR HIGHER *CHILLS OR SWEATING *NAUSEA AND VOMITING THAT IS NOT CONTROLLED WITH YOUR NAUSEA MEDICATION *UNUSUAL SHORTNESS OF BREATH *UNUSUAL BRUISING OR BLEEDING *URINARY PROBLEMS (pain or burning when urinating, or frequent urination) *BOWEL PROBLEMS (unusual diarrhea, constipation, pain near the anus) TENDERNESS IN MOUTH AND THROAT WITH OR WITHOUT PRESENCE OF ULCERS (sore throat, sores in mouth, or a toothache) UNUSUAL RASH, SWELLING OR PAIN  UNUSUAL VAGINAL DISCHARGE OR ITCHING   Items with * indicate a potential emergency and should be followed up as soon as possible or go to the Emergency Department if any problems should occur.  Please show the CHEMOTHERAPY ALERT CARD or IMMUNOTHERAPY  ALERT CARD at check-in to the Emergency Department and triage nurse.  Should you have questions after your visit or need to cancel or reschedule your appointment, please contact [facility]  Dept: [phone]  and follow the prompts.  Office hours are 8:00 a.m. to 4:30 p.m. Monday - Friday. Please note that voicemails left after 4:00 p.m. may not be returned until the following business day.  We are closed weekends and major holidays. You have access to a nurse at all times for urgent questions. Please call the main number to the clinic Dept: [phone] and follow the prompts.   For any non-urgent questions, you may also contact your provider using MyChart. We now offer e-Visits for anyone 18 and older to request care online for non-urgent symptoms. For details visit [email or web address].   Also download the MyChart app! Go to the app store, search "MyChart", open the app, select [place], and log in with your MyChart username and password. 

## 2022-11-17 NOTE — Progress Notes (Signed)
[facility] [phone]   [phone]  PROGRESS NOTE  Patient Care Team: Jethro Bastos, MD as PCP - General (Family Medicine) Ranelle Oyster, MD as Consulting Physician (Physical Medicine and Rehabilitation) Hildred Alamin Mcarthur Rossetti, PA-C as Physician Assistant (Physical Medicine and Rehabilitation) Jaci Standard, MD as Consulting Physician (Hematology and Oncology)  Hematological/Oncological History # IgG Lambda Multiple Myeloma, Relapsed (ISS Stage II) 1) 06/2010: initial diagnosis of Multiple Myeloma after T8 compression fracture. Treated with Velcade/Revlimid/Dexamethasone and achieved a complete remission 2) Velcade was discontinued in September 2012 and that Revlimid and Decadron were discontinued in March 2013. 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 4) Followed by Dr. Bertis Ruddy, last clinic visit 10/09/2019. At that time there was concern for relapse of his multiple myeloma.  5) Patient requested transfer to different provider after misunderstanding regarding imaging studies 6) 12/17/2019: transfer care to Dr. Leonides Schanz  7) 01/09/2020: Cycle 1 Day 1 of Dara/Velcade/Dex 8) 01/21/2020: presented as urgent visit for diarrhea and dehydration. Holding chemotherapy scheduled for 01/23/2020. 9) 01/30/2020: Resume dara/velcade/dex after resolution of diarrhea.  10) 02/13/2020: restaging labs show M protein 0.8, Kappa 4.5, lamba 17.2, ratio 0.26, urine M protein 53 (7.1%). All MM labs indicate improvement.  11) 03/10/2020: Cycle 4 Day 1 of Dara/Velcade/Dex. Transition to q 3 week daratumumab.  12) 06/04/2020:  Cycle 8 Day 1 of Dara/Velcade/Dex 13) 06/25/2020:  Cycle 9 Day 1 of Dara/Velcade/Dex 14) 07/22/2020: Cycle 10 Day 1 of  Dara//Dex 15) 08/18/2020: Cycle 11 Day 1 of Dara//Dex 16) 09/23/2020: Cycle 12 Day 1 of Dara//Dex 17) 10/22/2020: Cycle 13 Day 1 of Dara/Dex   18) 11/19/2020: Cycle 14 Day 1 of Dara/Dex  19) 12/17/2020: Cycle 15 Day 1 of Dara/Dex  20) 01/17/2021: Cycle 16 Day 1 of Dara/Dex  21) 02/11/2021: Cycle 17 Day 1 of Dara/Dex 22) 03/10/2021: Cycle 18 Day 1 of Dara/Dex  23) 04/08/2021: Cycle 19 Day 1 of Dara/Dex  24) 08/03/2021: Cycle 20 Day 1 of Dara/Dex (delayed due to scheduling error) 25) 09/02/2021: Cycle 21 Day 1 of Dara/Dex 26) 09/30/2021: Cycle 22 Day 1 of Dara/Dex 27) 10/28/2021: Cycle 23 Day 1 of Dara/Dex 28) 12/02/2021: Cycle 24 Day 1 of Dara/Dex 29) 12/30/2021: Cycle 25 Day 1 of Dara/Dex 30) 01/06/2022: Cycle 1 Day 1 of Kyprolis/Dex 31) 02/03/2022: Cycle 2 Day 1 of Kyprolis/Dex 32) 02/24/2022: Cycle 3 Day 1 of Kyprolis/Dex 33) 04/07/2022: Cycle 4 Day 1 of Kyprolis/Dex 34) 05/05/2022: Cycle 5 Day 1 of Kyprolis/Dex 35) 06/01/2022: Cycle 6 Day 1 of Kyprolis/Dex 36) 06/29/2022: Cycle 7 Day 1 of Kyprolis/Dex 37) 07/28/2022: Cycle 8 Day 1 of Kyprolis/Dex 38) 08/25/2022: Cycle 9 Day 1 of Kyprolis/Dex 39) 09/22/2022: Cycle 10 Day 1 of Kyprolis/Dex 40) 10/20/2022: Cycle 11 Day 1 of Kyprolis/Dex 41) 11/17/2022: Cycle 12 Day 1 of Kyprolis/Dex  Interval History:  [name] 67 y.o. male with medical history significant for  IgG Lambda Multiple Myeloma who presents for a follow up visit. The patient's last visit was on 11/03/2022.  He presents today to for cycle 12 day 1 of Kyprolis/Dex.   On exam today [name] reports he has been tolerating his Kyprolis well without any difficulty.  He reports that his appetite has been good and he is continue to be disheartened by his increased weight gain while on steroid therapy.  He did have a loose stool this morning but reports it  is a rare occurrence.  He is accompanied by his wife today.  She reports that she is concerned about some of the issues he has had.  She reports his memory can be quite foggy and that he is very tired on weeks where he receives chemotherapy.  She does note improvement on his weeks off.  He  notes that he denies fevers, chills, night sweats, shortness of breath, chest pain, cough, nausea, vomiting or abdominal pain.  He has no other complaints.  Rest of the 10 point ROS is below.  Overall he is willing and able to continue with Kyprolis treatment at this time.   MEDICAL HISTORY:  Past Medical History:  Diagnosis Date   Adrenal insufficiency (HCC)    on chronic dexamethasone   Anemia    Cancer (HCC)    Coagulopathy (HCC)    on xeralto/ s/p DVT while on coumadin,  IVC in place   Diabetes mellitus without complication (HCC)    type 2   Gross hematuria 7/14   post foley cath procedure   History of blood transfusion 7/14   Multiple myeloma    thoracic T8 with paraplegia s/p resection- on chemo at visit 10/13/10   Multiple myeloma    Multiple myeloma without mention of remission    Neurogenic bladder    Neurogenic bowel    Paraplegia (HCC)    Partial small bowel obstruction (HCC) during dec 2011 admission    SURGICAL HISTORY: Past Surgical History:  Procedure Laterality Date   COLONOSCOPY WITH PROPOFOL N/A 04/12/2017   Procedure: COLONOSCOPY WITH PROPOFOL;  Surgeon: Hilarie Fredrickson, MD;  Location: [facility];  Service: Endoscopy;  Laterality: N/A;   COLONOSCOPY WITH PROPOFOL N/A 04/19/2017   Procedure: COLONOSCOPY WITH PROPOFOL;  Surgeon: Benancio Deeds, MD;  Location: [facility];  Service: Gastroenterology;  Laterality: N/A;   COLOSTOMY  07/20/2011   Procedure: COLOSTOMY;  Surgeon: Rulon Abide, DO;  Location: [facility] OR;  Service: General;;   COLOSTOMY REVISION  07/20/2011   Procedure: COLON RESECTION SIGMOID;  Surgeon: Rulon Abide, DO;  Location: [facility] OR;  Service: General;;   CYSTOSCOPY N/A 04/04/2013   Procedure: CYSTOSCOPY WITH LITHALOPAXY;  Surgeon: Sebastian Ache, MD;  Location: [facility];  Service: Urology;  Laterality: N/A;   INSERTION OF SUPRAPUBIC CATHETER N/A 04/04/2013   Procedure: INSERTION OF SUPRAPUBIC CATHETER;  Surgeon: Sebastian Ache, MD;   Location: [facility];  Service: Urology;  Laterality: N/A;   LAPAROTOMY  07/20/2011   Procedure: EXPLORATORY LAPAROTOMY;  Surgeon: Rulon Abide, DO;  Location: [facility] OR;  Service: General;  Laterality: N/A;   myeloma thoracic T8 with parpaplegia s/p thoracotomy and thoracic T7-9 cage placement on Dec 26th 2011  07/18/10    SOCIAL HISTORY: Social History   Socioeconomic History   Marital status: Married    Spouse name: Not on file   Number of children: Not on file   Years of education: Not on file   Highest education level: Not on file  Occupational History   Not on file  Tobacco Use   Smoking status: Never   Smokeless tobacco: Never  Vaping Use   Vaping Use: Never used  Substance and Sexual Activity   Alcohol use: No   Drug use: No   Sexual activity: Never  Other Topics Concern   Not on file  Social History Narrative   Not on file   Social Determinants of Health   Financial Resource Strain: Not on file  Food Insecurity: Not  on file  Transportation Needs: Not on file  Physical Activity: Not on file  Stress: Not on file  Social Connections: Not on file  Intimate Partner Violence: Not on file    FAMILY HISTORY: Family History  Problem Relation Age of Onset   Ovarian cancer Mother    Diabetes Father     ALLERGIES:  is allergic to ferumoxytol.  MEDICATIONS:  Current Outpatient Medications  Medication Sig Dispense Refill   acetaminophen (TYLENOL) 325 MG tablet Take 650 mg by mouth every 4 (four) hours as needed.     chlorhexidine (PERIDEX) 0.12 % solution 15 mL.     cholecalciferol (VITAMIN D3) 25 MCG (1000 UNIT) tablet Take by mouth.     finasteride (PROSCAR) 5 MG tablet Take 5 mg by mouth daily.     metFORMIN (GLUCOPHAGE) 500 MG tablet Take 500 mg by mouth 2 (two) times daily with a meal.     acyclovir (ZOVIRAX) 400 MG tablet Take 1 tablet (400 mg total) by mouth 2 (two) times daily. 60 tablet 2   atorvastatin (LIPITOR) 10 MG tablet Take 10 mg by mouth daily.      baclofen (LIORESAL) 20 MG tablet Take 20 mg by mouth 2 (two) times daily.     diazepam (DIASTAT ACUDIAL) 10 MG GEL SMARTSIG:By Mouth     dorzolamide (TRUSOPT) 2 % ophthalmic solution 1 drop 2 (two) times daily.     iron polysaccharides (NU-IRON) 150 MG capsule Take 1 capsule (150 mg total) by mouth daily. 30 capsule 0   latanoprost (XALATAN) 0.005 % ophthalmic solution Place 1 drop into both eyes at bedtime.     ondansetron (ZOFRAN) 8 MG tablet Take 1 tablet (8 mg total) by mouth every 8 (eight) hours as needed for nausea or vomiting. 30 tablet 0   rivaroxaban (XARELTO) 10 MG TABS tablet Take 1 tablet (10 mg total) by mouth daily with supper. 30 tablet 9   Skin Protectants, Misc. (EUCERIN) cream Apply 1 application topically 2 (two) times daily as needed for dry skin.      zinc oxide (BALMEX) 11.3 % CREA cream Apply 1 application topically 2 (two) times daily.     No current facility-administered medications for this visit.   Facility-Administered Medications Ordered in Other Visits  Medication Dose Route Frequency Provider Last Rate Last Admin   sodium chloride flush (NS) 0.9 % injection 10 mL  10 mL Intravenous PRN Bertis Ruddy, Ni, MD        REVIEW OF SYSTEMS:   Constitutional: ( - ) fevers, ( - )  chills , ( - ) night sweats Eyes: ( - ) blurriness of vision, ( - ) double vision, ( - ) watery eyes Ears, nose, mouth, throat, and face: ( - ) mucositis, ( - ) sore throat Respiratory: ( - ) cough, ( - ) dyspnea, ( - ) wheezes Cardiovascular: ( - ) palpitation, ( - ) chest discomfort, ( - ) lower extremity swelling Gastrointestinal:  ( - ) nausea, ( - ) heartburn, ( - ) change in bowel habits Skin: ( - ) abnormal skin rashes Lymphatics: ( - ) new lymphadenopathy, ( - ) easy bruising Neurological: ( - ) numbness, ( - ) tingling, ( - ) new weaknesses Behavioral/Psych: ( - ) mood change, ( - ) new changes  All other systems were reviewed with the patient and are negative.  PHYSICAL  EXAMINATION: ECOG PERFORMANCE STATUS: paraplegic.   Vitals:   11/17/22 1206  BP: 139/85  Pulse: 77  Resp: 14  Temp: (!) 97.3 F (36.3 C)  SpO2: 100%    Filed Weights   11/17/22 1206  Weight: 253 lb (114.8 kg)      GENERAL: well appearing middle aged [place] American male alert, no distress and comfortable SKIN: skin color, texture, turgor are normal, no rashes or significant lesions EYES: conjunctiva are pink and non-injected, sclera clear LUNGS: clear to auscultation and percussion with normal breathing effort HEART: regular rate & rhythm and no murmurs Musculoskeletal: no cyanosis of digits and no clubbing  PSYCH: alert & oriented x 3, fluent speech NEURO: paraplegic, no use of LE bilaterally.   LABORATORY DATA:  I have reviewed the data as listed    Latest Ref Rng & Units 11/17/2022   10:38 AM 11/03/2022    9:20 AM 10/27/2022    9:55 AM  CBC  WBC 4.0 - 10.5 K/uL 6.7  7.8  9.0   Hemoglobin 13.0 - 17.0 g/dL 16.1  09.6  04.5   Hematocrit 39.0 - 52.0 % 34.9  34.3  34.2   Platelets 150 - 400 K/uL 433  206  213        Latest Ref Rng & Units 11/17/2022   10:38 AM 11/03/2022    9:20 AM 10/27/2022    9:55 AM  CMP  Glucose 70 - 99 mg/dL 409  811  914   BUN 8 - 23 mg/dL 9  15  13    Creatinine 0.61 - 1.24 mg/dL 7.82  9.56  2.13   Sodium 135 - 145 mmol/L 144  144  145   Potassium 3.5 - 5.1 mmol/L 3.8  3.6  3.9   Chloride 98 - 111 mmol/L 110  112  111   CO2 22 - 32 mmol/L 29  27  27    Calcium 8.9 - 10.3 mg/dL 9.5  9.0  9.8   Total Protein 6.5 - 8.1 g/dL 6.3  6.0  6.2   Total Bilirubin 0.3 - 1.2 mg/dL 0.7  0.7  0.8   Alkaline Phos 38 - 126 U/L 88  90  88   AST 15 - 41 U/L 12  10  9    ALT 0 - 44 U/L 13  14  15      Lab Results  Component Value Date   MPROTEIN 0.2 (H) 10/20/2022   MPROTEIN 0.2 (H) 09/22/2022   MPROTEIN 0.2 (H) 08/25/2022   Lab Results  Component Value Date   KPAFRELGTCHN 12.4 10/20/2022   KPAFRELGTCHN 10.3 09/22/2022   KPAFRELGTCHN 17.1 08/25/2022    LAMBDASER 9.9 10/20/2022   LAMBDASER 9.9 09/22/2022   LAMBDASER 11.0 08/25/2022   KAPLAMBRATIO 1.25 10/20/2022   KAPLAMBRATIO 1.04 09/22/2022   KAPLAMBRATIO 1.55 08/25/2022    RADIOGRAPHIC STUDIES: No results found.  ASSESSMENT & PLAN [name] 67 y.o. male with medical history significant for  IgG Lambda Multiple Myeloma who presents for a follow up visit.  After review of the labs, discussion with the patient, and reviewed the imaging his findings are most consistent with a relapsed multiple myeloma.The patient has had excellent success before with treatment of his myeloma with Velcade, Revlimid, and dexamethasone.  Transition to daratumumab therapy and was on a once monthly treatments but unfortunately developed progression of disease.  We have transitioned to Kyprolis/Dex.  # IgG Lambda Multiple Myeloma, Relapsed (ISS Stage II) --findings are most consistent with relapsed multiple myeloma. Patient previously successfully treated with Velcade/Rev/Dex and Daratumumab/Velcade/Dex. On 07/02/2020 he transitioned to monthly daratumumab alone.  --due to  to rise in M protein, switched to Kyprolis and Dexamethasone on 01/06/2022 Plan: --Labs show white blood cell count 6.7, hemoglobin 11.6, MCV 80.8, and platelets of 433, additionally creatinine and LFTs are normal.  Myeloma labs from 10/20/2022 showed M protein dropped to 0.2 with normalization of kappa lambda ratio to 1.25 --patient will proceed with Cycle 12 Day 1 Kyprolis today.  --return for weekly Kyprolis and clinic visit  in 2 weeks.   #History of DVT --He had placement of IVC filter, remains on Xarelto. --Due to poor mobility, and lack of bleeding complications, I recommend he remain on Xarelto indefinitely. --caution if Plt count were to drop <50  #Snoring/Risk for OSA: --Scheduled for home sleep center to test for OSA end of this month. We will follow up based on results.   # Supportive Care -- provided patient with an  albuterol inhaler (for use with daratumumab) --acyclovir 400mg  BID for VZV prophylaxis --zofran 8mg  q8H PRN for nausea/vomiting  --Zometa is being held in the setting of his prior episode of osteonecrosis of the jaw  No orders of the defined types were placed in this encounter.   All questions were answered. The patient knows to call the clinic with any problems, questions or concerns.  I have spent a total of 25 minutes minutes of face-to-face and non-face-to-face time, preparing to see the patient,  performing a medically appropriate examination, counseling and educating the patient, communicating with other health care professionals, documenting clinical information in the electronic health record,  and care coordination.   Ulysees Barns, MD Department of Hematology/Oncology [facility] Phone: [phone] Pager: [phone] Email: [email or web address]   11/17/2022 1:48 PM   Literature Support:  Milagros Loll, Petrucci MT, [place], [place], [place], Doyle, Spada S, Limon, Ponticelli E, Clyman, Cavo M, Di Toritto TC, Haydee Salter F, Montefusco V, Palumbo A, Boccadoro M, Larocca A. Once-weekly versus twice-weekly carfilzomib in patients with newly diagnosed multiple myeloma: a pooled analysis of two phase I/II studies. Haematologica. 2019 Aug;104(8):1640-1647.  --Once-weekly 70 mg/m2 carfilzomib as induction and maintenance therapy for newly diagnosed multiple myeloma patients was as safe and effective as twice-weekly 36 mg/m2 carfilzomib and provided a more convenient schedule.

## 2022-11-20 ENCOUNTER — Telehealth: Payer: Self-pay | Admitting: Hematology and Oncology

## 2022-11-20 LAB — KAPPA/LAMBDA LIGHT CHAINS
Kappa free light chain: 10.4 mg/L (ref 3.3–19.4)
Kappa, lambda light chain ratio: 1.32 (ref 0.26–1.65)
Lambda free light chains: 7.9 mg/L (ref 5.7–26.3)

## 2022-11-20 NOTE — Telephone Encounter (Signed)
Reached out to patient to schedule per wq, no answer will try again at a later time.

## 2022-11-24 ENCOUNTER — Inpatient Hospital Stay: Payer: Medicare (Managed Care) | Attending: Physician Assistant

## 2022-11-24 ENCOUNTER — Other Ambulatory Visit: Payer: Self-pay

## 2022-11-24 ENCOUNTER — Inpatient Hospital Stay: Payer: Medicare (Managed Care)

## 2022-11-24 VITALS — BP 136/81 | HR 83 | Temp 98.3°F | Resp 16 | Wt 250.0 lb

## 2022-11-24 DIAGNOSIS — Z7901 Long term (current) use of anticoagulants: Secondary | ICD-10-CM | POA: Diagnosis not present

## 2022-11-24 DIAGNOSIS — Z86718 Personal history of other venous thrombosis and embolism: Secondary | ICD-10-CM | POA: Diagnosis not present

## 2022-11-24 DIAGNOSIS — D7289 Other specified disorders of white blood cells: Secondary | ICD-10-CM | POA: Insufficient documentation

## 2022-11-24 DIAGNOSIS — C9002 Multiple myeloma in relapse: Secondary | ICD-10-CM

## 2022-11-24 DIAGNOSIS — Z5111 Encounter for antineoplastic chemotherapy: Secondary | ICD-10-CM | POA: Diagnosis not present

## 2022-11-24 LAB — CMP (CANCER CENTER ONLY)
ALT: 13 U/L (ref 0–44)
AST: 11 U/L — ABNORMAL LOW (ref 15–41)
Albumin: 3.7 g/dL (ref 3.5–5.0)
Alkaline Phosphatase: 85 U/L (ref 38–126)
Anion gap: 7 (ref 5–15)
BUN: 17 mg/dL (ref 8–23)
CO2: 23 mmol/L (ref 22–32)
Calcium: 9.3 mg/dL (ref 8.9–10.3)
Chloride: 112 mmol/L — ABNORMAL HIGH (ref 98–111)
Creatinine: 0.79 mg/dL (ref 0.61–1.24)
GFR, Estimated: >60 mL/min (ref >60)
Glucose, Bld: 143 mg/dL — ABNORMAL HIGH (ref 70–99)
Potassium: 3.9 mmol/L (ref 3.5–5.1)
Sodium: 142 mmol/L (ref 135–145)
Total Bilirubin: 0.8 mg/dL (ref 0.3–1.2)
Total Protein: 6.2 g/dL — ABNORMAL LOW (ref 6.5–8.1)

## 2022-11-24 LAB — CBC WITH DIFFERENTIAL (CANCER CENTER ONLY)
Abs Immature Granulocytes: 0.06 10*3/uL (ref 0.00–0.07)
Basophils Absolute: 0 10*3/uL (ref 0.0–0.1)
Basophils Relative: 0 %
Eosinophils Absolute: 0.5 10*3/uL (ref 0.0–0.5)
Eosinophils Relative: 5 %
HCT: 35.2 % — ABNORMAL LOW (ref 39.0–52.0)
Hemoglobin: 11.9 g/dL — ABNORMAL LOW (ref 13.0–17.0)
Immature Granulocytes: 1 %
Lymphocytes Relative: 12 %
Lymphs Abs: 1.1 10*3/uL (ref 0.7–4.0)
MCH: 26.9 pg (ref 26.0–34.0)
MCHC: 33.8 g/dL (ref 30.0–36.0)
MCV: 79.5 fL — ABNORMAL LOW (ref 80.0–100.0)
Monocytes Absolute: 0.9 10*3/uL (ref 0.1–1.0)
Monocytes Relative: 9 %
Neutro Abs: 6.5 10*3/uL (ref 1.7–7.7)
Neutrophils Relative %: 73 %
Platelet Count: 218 10*3/uL (ref 150–400)
RBC: 4.43 MIL/uL (ref 4.22–5.81)
RDW: 19.2 % — ABNORMAL HIGH (ref 11.5–15.5)
WBC Count: 9 10*3/uL (ref 4.0–10.5)
nRBC: 0 % (ref 0.0–0.2)

## 2022-11-24 LAB — MULTIPLE MYELOMA PANEL, SERUM
Albumin SerPl Elph-Mcnc: 3.1 g/dL (ref 2.9–4.4)
Albumin/Glob SerPl: 1.2 (ref 0.7–1.7)
Alpha 1: 0.3 g/dL (ref 0.0–0.4)
Alpha2 Glob SerPl Elph-Mcnc: 0.9 g/dL (ref 0.4–1.0)
B-Globulin SerPl Elph-Mcnc: 1 g/dL (ref 0.7–1.3)
Gamma Glob SerPl Elph-Mcnc: 0.5 g/dL (ref 0.4–1.8)
Globulin, Total: 2.7 g/dL (ref 2.2–3.9)
IgA: 41 mg/dL — ABNORMAL LOW (ref 61–437)
IgG (Immunoglobin G), Serum: 620 mg/dL (ref 603–1613)
IgM (Immunoglobulin M), Srm: 20 mg/dL (ref 20–172)
M Protein SerPl Elph-Mcnc: 0.2 g/dL — ABNORMAL HIGH
Total Protein ELP: 5.8 g/dL — ABNORMAL LOW (ref 6.0–8.5)

## 2022-11-24 MED ORDER — SODIUM CHLORIDE 0.9 % IV SOLN: Freq: Once | INTRAVENOUS | Status: AC

## 2022-11-24 MED ORDER — SODIUM CHLORIDE 0.9 % IV SOLN: Freq: Once | INTRAVENOUS | Status: DC

## 2022-11-24 MED ORDER — SODIUM CHLORIDE 0.9 % IV SOLN
40.0000 mg | Freq: Once | INTRAVENOUS | Status: AC
  Administered 2022-11-24: 40 mg via INTRAVENOUS
  Filled 2022-11-24: qty 4

## 2022-11-24 MED ORDER — DEXTROSE 5 % IV SOLN
70.0000 mg/m2 | Freq: Once | INTRAVENOUS | Status: AC
  Administered 2022-11-24: 150 mg via INTRAVENOUS
  Filled 2022-11-24: qty 60

## 2022-11-24 NOTE — Patient Instructions (Signed)
[facility]  Discharge Instructions: Thank you for choosing [facility] to provide your oncology and hematology care.   If you have a lab appointment with the Cancer Center, please go directly to the Cancer Center and check in at the registration area.   Wear comfortable clothing and clothing appropriate for easy access to any Portacath or PICC line.   We strive to give you quality time with your provider. You may need to reschedule your appointment if you arrive late (15 or more minutes).  Arriving late affects you and other patients whose appointments are after yours.  Also, if you miss three or more appointments without notifying the office, you may be dismissed from the clinic at the provider's discretion.      For prescription refill requests, have your pharmacy contact our office and allow 72 hours for refills to be completed.    Today you received the following chemotherapy and/or immunotherapy agents :  Kyprolis   To help prevent nausea and vomiting after your treatment, we encourage you to take your nausea medication as directed.  BELOW ARE SYMPTOMS THAT SHOULD BE REPORTED IMMEDIATELY: *FEVER GREATER THAN 100.4 F (38 C) OR HIGHER *CHILLS OR SWEATING *NAUSEA AND VOMITING THAT IS NOT CONTROLLED WITH YOUR NAUSEA MEDICATION *UNUSUAL SHORTNESS OF BREATH *UNUSUAL BRUISING OR BLEEDING *URINARY PROBLEMS (pain or burning when urinating, or frequent urination) *BOWEL PROBLEMS (unusual diarrhea, constipation, pain near the anus) TENDERNESS IN MOUTH AND THROAT WITH OR WITHOUT PRESENCE OF ULCERS (sore throat, sores in mouth, or a toothache) UNUSUAL RASH, SWELLING OR PAIN  UNUSUAL VAGINAL DISCHARGE OR ITCHING   Items with * indicate a potential emergency and should be followed up as soon as possible or go to the Emergency Department if any problems should occur.  Please show the CHEMOTHERAPY ALERT CARD or IMMUNOTHERAPY ALERT CARD at  check-in to the Emergency Department and triage nurse.  Should you have questions after your visit or need to cancel or reschedule your appointment, please contact [facility]  Dept: [phone]  and follow the prompts.  Office hours are 8:00 a.m. to 4:30 p.m. Monday - Friday. Please note that voicemails left after 4:00 p.m. may not be returned until the following business day.  We are closed weekends and major holidays. You have access to a nurse at all times for urgent questions. Please call the main number to the clinic Dept: [phone] and follow the prompts.   For any non-urgent questions, you may also contact your provider using MyChart. We now offer e-Visits for anyone 18 and older to request care online for non-urgent symptoms. For details visit [email or web address].   Also download the MyChart app! Go to the app store, search "MyChart", open the app, select [facility], and log in with your MyChart username and password.   

## 2022-12-01 ENCOUNTER — Ambulatory Visit: Payer: Medicare (Managed Care) | Admitting: Physician Assistant

## 2022-12-01 ENCOUNTER — Other Ambulatory Visit: Payer: Medicare (Managed Care)

## 2022-12-01 ENCOUNTER — Ambulatory Visit: Payer: Medicare (Managed Care)

## 2022-12-11 ENCOUNTER — Telehealth: Payer: Self-pay | Admitting: Physician Assistant

## 2022-12-15 ENCOUNTER — Inpatient Hospital Stay: Payer: Medicare (Managed Care)

## 2022-12-15 ENCOUNTER — Inpatient Hospital Stay: Payer: Medicare (Managed Care) | Admitting: Physician Assistant

## 2022-12-15 ENCOUNTER — Telehealth: Payer: Self-pay | Admitting: Physician Assistant

## 2022-12-19 ENCOUNTER — Other Ambulatory Visit: Payer: Self-pay | Admitting: Family Medicine

## 2022-12-19 ENCOUNTER — Ambulatory Visit
Admission: RE | Admit: 2022-12-19 | Discharge: 2022-12-19 | Disposition: A | Discharge status: Home / Self Care | Payer: Medicare (Managed Care) | Source: Ambulatory Visit | Attending: Family Medicine | Admitting: Family Medicine

## 2022-12-19 DIAGNOSIS — R053 Chronic cough: Secondary | ICD-10-CM

## 2022-12-22 ENCOUNTER — Encounter (HOSPITAL_COMMUNITY): Payer: Self-pay | Admitting: Internal Medicine

## 2022-12-22 ENCOUNTER — Other Ambulatory Visit: Payer: Medicare (Managed Care)

## 2022-12-22 ENCOUNTER — Inpatient Hospital Stay: Payer: Medicare (Managed Care)

## 2022-12-22 ENCOUNTER — Encounter (HOSPITAL_COMMUNITY): Payer: Self-pay

## 2022-12-22 ENCOUNTER — Inpatient Hospital Stay (HOSPITAL_BASED_OUTPATIENT_CLINIC_OR_DEPARTMENT_OTHER): Payer: Medicare (Managed Care) | Admitting: Physician Assistant

## 2022-12-22 ENCOUNTER — Inpatient Hospital Stay (HOSPITAL_COMMUNITY)
Admission: RE | Admit: 2022-12-22 | Discharge: 2023-01-08 | DRG: 854 | Disposition: A | Discharge status: Home Health | Payer: Medicare (Managed Care) | Source: Ambulatory Visit | Attending: Internal Medicine | Admitting: Internal Medicine

## 2022-12-22 ENCOUNTER — Ambulatory Visit: Payer: Medicare (Managed Care)

## 2022-12-22 VITALS — BP 107/71 | HR 104 | Temp 97.0°F | Resp 18 | Ht 68.0 in | Wt 239.3 lb

## 2022-12-22 DIAGNOSIS — G8220 Paraplegia, unspecified: Secondary | ICD-10-CM | POA: Diagnosis present

## 2022-12-22 DIAGNOSIS — D72825 Bandemia: Secondary | ICD-10-CM

## 2022-12-22 DIAGNOSIS — E785 Hyperlipidemia, unspecified: Secondary | ICD-10-CM | POA: Diagnosis not present

## 2022-12-22 DIAGNOSIS — E1149 Type 2 diabetes mellitus with other diabetic neurological complication: Secondary | ICD-10-CM | POA: Diagnosis not present

## 2022-12-22 DIAGNOSIS — Z7901 Long term (current) use of anticoagulants: Secondary | ICD-10-CM

## 2022-12-22 DIAGNOSIS — N1330 Unspecified hydronephrosis: Secondary | ICD-10-CM | POA: Diagnosis present

## 2022-12-22 DIAGNOSIS — E1169 Type 2 diabetes mellitus with other specified complication: Secondary | ICD-10-CM | POA: Diagnosis present

## 2022-12-22 DIAGNOSIS — Z1623 Resistance to quinolones and fluoroquinolones: Secondary | ICD-10-CM | POA: Diagnosis present

## 2022-12-22 DIAGNOSIS — N492 Inflammatory disorders of scrotum: Secondary | ICD-10-CM | POA: Diagnosis present

## 2022-12-22 DIAGNOSIS — Z833 Family history of diabetes mellitus: Secondary | ICD-10-CM

## 2022-12-22 DIAGNOSIS — Z8616 Personal history of COVID-19: Secondary | ICD-10-CM | POA: Diagnosis not present

## 2022-12-22 DIAGNOSIS — D849 Immunodeficiency, unspecified: Secondary | ICD-10-CM | POA: Diagnosis present

## 2022-12-22 DIAGNOSIS — A419 Sepsis, unspecified organism: Principal | ICD-10-CM | POA: Diagnosis present

## 2022-12-22 DIAGNOSIS — C9002 Multiple myeloma in relapse: Secondary | ICD-10-CM | POA: Diagnosis not present

## 2022-12-22 DIAGNOSIS — Z86718 Personal history of other venous thrombosis and embolism: Secondary | ICD-10-CM

## 2022-12-22 DIAGNOSIS — B964 Proteus (mirabilis) (morganii) as the cause of diseases classified elsewhere: Secondary | ICD-10-CM | POA: Diagnosis present

## 2022-12-22 DIAGNOSIS — Z933 Colostomy status: Secondary | ICD-10-CM | POA: Diagnosis not present

## 2022-12-22 DIAGNOSIS — K592 Neurogenic bowel, not elsewhere classified: Secondary | ICD-10-CM | POA: Diagnosis present

## 2022-12-22 DIAGNOSIS — Z9221 Personal history of antineoplastic chemotherapy: Secondary | ICD-10-CM

## 2022-12-22 DIAGNOSIS — R680 Hypothermia, not associated with low environmental temperature: Secondary | ICD-10-CM | POA: Diagnosis present

## 2022-12-22 DIAGNOSIS — E1152 Type 2 diabetes mellitus with diabetic peripheral angiopathy with gangrene: Secondary | ICD-10-CM | POA: Diagnosis present

## 2022-12-22 DIAGNOSIS — N433 Hydrocele, unspecified: Secondary | ICD-10-CM | POA: Diagnosis present

## 2022-12-22 DIAGNOSIS — N493 Fournier gangrene: Secondary | ICD-10-CM | POA: Diagnosis not present

## 2022-12-22 DIAGNOSIS — D649 Anemia, unspecified: Secondary | ICD-10-CM | POA: Diagnosis not present

## 2022-12-22 DIAGNOSIS — N319 Neuromuscular dysfunction of bladder, unspecified: Secondary | ICD-10-CM | POA: Diagnosis not present

## 2022-12-22 DIAGNOSIS — R6510 Systemic inflammatory response syndrome (SIRS) of non-infectious origin without acute organ dysfunction: Secondary | ICD-10-CM

## 2022-12-22 DIAGNOSIS — I82409 Acute embolism and thrombosis of unspecified deep veins of unspecified lower extremity: Secondary | ICD-10-CM | POA: Diagnosis not present

## 2022-12-22 DIAGNOSIS — H409 Unspecified glaucoma: Secondary | ICD-10-CM | POA: Diagnosis present

## 2022-12-22 DIAGNOSIS — N390 Urinary tract infection, site not specified: Secondary | ICD-10-CM | POA: Diagnosis present

## 2022-12-22 DIAGNOSIS — D509 Iron deficiency anemia, unspecified: Secondary | ICD-10-CM | POA: Diagnosis present

## 2022-12-22 DIAGNOSIS — G4730 Sleep apnea, unspecified: Secondary | ICD-10-CM | POA: Diagnosis not present

## 2022-12-22 DIAGNOSIS — C9000 Multiple myeloma not having achieved remission: Secondary | ICD-10-CM | POA: Diagnosis present

## 2022-12-22 DIAGNOSIS — E119 Type 2 diabetes mellitus without complications: Secondary | ICD-10-CM | POA: Diagnosis present

## 2022-12-22 DIAGNOSIS — E669 Obesity, unspecified: Secondary | ICD-10-CM | POA: Diagnosis present

## 2022-12-22 DIAGNOSIS — E2740 Unspecified adrenocortical insufficiency: Secondary | ICD-10-CM | POA: Diagnosis present

## 2022-12-22 DIAGNOSIS — G4733 Obstructive sleep apnea (adult) (pediatric): Secondary | ICD-10-CM | POA: Diagnosis present

## 2022-12-22 DIAGNOSIS — E876 Hypokalemia: Secondary | ICD-10-CM | POA: Diagnosis present

## 2022-12-22 DIAGNOSIS — Z79899 Other long term (current) drug therapy: Secondary | ICD-10-CM

## 2022-12-22 DIAGNOSIS — D75839 Thrombocytosis, unspecified: Secondary | ICD-10-CM | POA: Diagnosis not present

## 2022-12-22 DIAGNOSIS — E8809 Other disorders of plasma-protein metabolism, not elsewhere classified: Secondary | ICD-10-CM | POA: Diagnosis present

## 2022-12-22 DIAGNOSIS — Z7401 Bed confinement status: Secondary | ICD-10-CM

## 2022-12-22 DIAGNOSIS — Z95828 Presence of other vascular implants and grafts: Secondary | ICD-10-CM

## 2022-12-22 DIAGNOSIS — Z6836 Body mass index (BMI) 36.0-36.9, adult: Secondary | ICD-10-CM

## 2022-12-22 DIAGNOSIS — Z888 Allergy status to other drugs, medicaments and biological substances status: Secondary | ICD-10-CM

## 2022-12-22 DIAGNOSIS — T380X5A Adverse effect of glucocorticoids and synthetic analogues, initial encounter: Secondary | ICD-10-CM | POA: Diagnosis present

## 2022-12-22 HISTORY — DX: Unspecified glaucoma: H40.9

## 2022-12-22 LAB — CBC WITH DIFFERENTIAL (CANCER CENTER ONLY)
Abs Immature Granulocytes: 0.22 10*3/uL — ABNORMAL HIGH (ref 0.00–0.07)
Basophils Absolute: 0.1 10*3/uL (ref 0.0–0.1)
Basophils Relative: 0 %
Eosinophils Absolute: 0.1 10*3/uL (ref 0.0–0.5)
Eosinophils Relative: 0 %
HCT: 27.9 % — ABNORMAL LOW (ref 39.0–52.0)
Hemoglobin: 9.6 g/dL — ABNORMAL LOW (ref 13.0–17.0)
Immature Granulocytes: 1 %
Lymphocytes Relative: 4 %
Lymphs Abs: 1.2 10*3/uL (ref 0.7–4.0)
MCH: 26.5 pg (ref 26.0–34.0)
MCHC: 34.4 g/dL (ref 30.0–36.0)
MCV: 77.1 fL — ABNORMAL LOW (ref 80.0–100.0)
Monocytes Absolute: 1.5 10*3/uL — ABNORMAL HIGH (ref 0.1–1.0)
Monocytes Relative: 6 %
Neutro Abs: 23.7 10*3/uL — ABNORMAL HIGH (ref 1.7–7.7)
Neutrophils Relative %: 89 %
Platelet Count: 409 10*3/uL — ABNORMAL HIGH (ref 150–400)
RBC: 3.62 MIL/uL — ABNORMAL LOW (ref 4.22–5.81)
RDW: 19 % — ABNORMAL HIGH (ref 11.5–15.5)
WBC Count: 26.7 10*3/uL — ABNORMAL HIGH (ref 4.0–10.5)
nRBC: 0 % (ref 0.0–0.2)

## 2022-12-22 LAB — URINALYSIS, COMPLETE (UACMP) WITH MICROSCOPIC
Bilirubin Urine: NEGATIVE
Glucose, UA: NEGATIVE mg/dL
Ketones, ur: NEGATIVE mg/dL
Nitrite: NEGATIVE
Protein, ur: 100 mg/dL — AB
RBC / HPF: >50 RBC/hpf (ref 0–5)
Specific Gravity, Urine: 1.018 (ref 1.005–1.030)
WBC, UA: >50 WBC/hpf (ref 0–5)
pH: 6 (ref 5.0–8.0)

## 2022-12-22 LAB — IRON AND IRON BINDING CAPACITY (CC-WL,HP ONLY)
Iron: 18 ug/dL — ABNORMAL LOW (ref 45–182)
Saturation Ratios: 10 % — ABNORMAL LOW (ref 17.9–39.5)
TIBC: 174 ug/dL — ABNORMAL LOW (ref 250–450)
UIBC: 156 ug/dL (ref 117–376)

## 2022-12-22 LAB — CMP (CANCER CENTER ONLY)
ALT: 32 U/L (ref 0–44)
AST: 38 U/L (ref 15–41)
Albumin: 3.3 g/dL — ABNORMAL LOW (ref 3.5–5.0)
Alkaline Phosphatase: 108 U/L (ref 38–126)
Anion gap: 8 (ref 5–15)
BUN: 11 mg/dL (ref 8–23)
CO2: 27 mmol/L (ref 22–32)
Calcium: 9.2 mg/dL (ref 8.9–10.3)
Chloride: 102 mmol/L (ref 98–111)
Creatinine: 0.75 mg/dL (ref 0.61–1.24)
GFR, Estimated: >60 mL/min (ref >60)
Glucose, Bld: 125 mg/dL — ABNORMAL HIGH (ref 70–99)
Potassium: 3.6 mmol/L (ref 3.5–5.1)
Sodium: 137 mmol/L (ref 135–145)
Total Bilirubin: 1.1 mg/dL (ref 0.3–1.2)
Total Protein: 7.2 g/dL (ref 6.5–8.1)

## 2022-12-22 LAB — SEDIMENTATION RATE: Sed Rate: 99 mm/hr — ABNORMAL HIGH (ref 0–16)

## 2022-12-22 LAB — FERRITIN: Ferritin: 689 ng/mL — ABNORMAL HIGH (ref 24–336)

## 2022-12-22 LAB — C-REACTIVE PROTEIN: CRP: 23.7 mg/dL — ABNORMAL HIGH (ref <1.0)

## 2022-12-22 MED ORDER — RIVAROXABAN 10 MG PO TABS
10.0000 mg | ORAL_TABLET | Freq: Every day | ORAL | Status: DC
  Administered 2022-12-22 – 2022-12-24 (×3): 10 mg via ORAL
  Filled 2022-12-22 (×3): qty 1

## 2022-12-22 MED ORDER — ONDANSETRON HCL 4 MG/2ML IJ SOLN
8.0000 mg | Freq: Once | INTRAMUSCULAR | Status: AC
  Administered 2022-12-22: 8 mg via INTRAVENOUS
  Filled 2022-12-22: qty 4

## 2022-12-22 MED ORDER — SODIUM CHLORIDE 0.9 % IV BOLUS (SEPSIS)
2000.0000 mL | Freq: Once | INTRAVENOUS | Status: AC
  Administered 2022-12-22: 2000 mL via INTRAVENOUS

## 2022-12-22 MED ORDER — HYDROCERIN EX CREA: 1.0000 | TOPICAL_CREAM | Freq: Two times a day (BID) | CUTANEOUS | Status: DC | PRN

## 2022-12-22 MED ORDER — DORZOLAMIDE HCL 2 % OP SOLN: 1.0000 [drp] | Freq: Two times a day (BID) | OPHTHALMIC | Status: DC

## 2022-12-22 MED ORDER — ZINC OXIDE 11.3 % EX CREA
1.0000 | TOPICAL_CREAM | Freq: Two times a day (BID) | CUTANEOUS | Status: DC
  Administered 2022-12-23 – 2023-01-08 (×31): 1 via TOPICAL
  Filled 2022-12-22: qty 56

## 2022-12-22 MED ORDER — ONDANSETRON HCL 4 MG PO TABS: 4.0000 mg | ORAL_TABLET | Freq: Four times a day (QID) | ORAL | Status: DC | PRN

## 2022-12-22 MED ORDER — DORZOLAMIDE HCL-TIMOLOL MAL 2-0.5 % OP SOLN
1.0000 [drp] | Freq: Two times a day (BID) | OPHTHALMIC | Status: DC
  Administered 2022-12-22 – 2023-01-08 (×34): 1 [drp] via OPHTHALMIC
  Filled 2022-12-22: qty 10

## 2022-12-22 MED ORDER — ACYCLOVIR 400 MG PO TABS
400.0000 mg | ORAL_TABLET | Freq: Two times a day (BID) | ORAL | Status: DC
  Administered 2022-12-22 – 2023-01-08 (×34): 400 mg via ORAL
  Filled 2022-12-22 (×35): qty 1

## 2022-12-22 MED ORDER — SODIUM CHLORIDE 0.9 % IV SOLN
150.0000 mL/h | INTRAVENOUS | Status: DC
  Administered 2022-12-22 (×2): 150 mL/h via INTRAVENOUS

## 2022-12-22 MED ORDER — VITAMIN D 25 MCG (1000 UNIT) PO TABS
1000.0000 [IU] | ORAL_TABLET | Freq: Every day | ORAL | Status: DC
  Administered 2022-12-23 – 2023-01-08 (×17): 1000 [IU] via ORAL
  Filled 2022-12-22 (×16): qty 1

## 2022-12-22 MED ORDER — ATORVASTATIN CALCIUM 20 MG PO TABS
20.0000 mg | ORAL_TABLET | Freq: Every day | ORAL | Status: DC
  Administered 2022-12-23 – 2023-01-08 (×17): 20 mg via ORAL
  Filled 2022-12-22 (×7): qty 1
  Filled 2022-12-22: qty 2
  Filled 2022-12-22: qty 1
  Filled 2022-12-22: qty 2
  Filled 2022-12-22 (×5): qty 1
  Filled 2022-12-22: qty 2
  Filled 2022-12-22: qty 1

## 2022-12-22 MED ORDER — SODIUM CHLORIDE 0.9 % IV SOLN: Freq: Once | INTRAVENOUS | Status: AC

## 2022-12-22 MED ORDER — DIAZEPAM 5 MG PO TABS
5.0000 mg | ORAL_TABLET | Freq: Two times a day (BID) | ORAL | Status: DC | PRN
  Administered 2022-12-22: 5 mg via ORAL
  Filled 2022-12-22: qty 1

## 2022-12-22 MED ORDER — SODIUM CHLORIDE 0.9 % IV SOLN: INTRAVENOUS | Status: AC

## 2022-12-22 MED ORDER — POLYSACCHARIDE IRON COMPLEX 150 MG PO CAPS
150.0000 mg | ORAL_CAPSULE | Freq: Every day | ORAL | Status: DC
  Administered 2022-12-23 – 2023-01-08 (×17): 150 mg via ORAL
  Filled 2022-12-22 (×17): qty 1

## 2022-12-22 MED ORDER — SODIUM CHLORIDE 0.9 % IV SOLN
2.0000 g | INTRAVENOUS | Status: DC
  Administered 2022-12-22 – 2022-12-24 (×3): 2 g via INTRAVENOUS
  Filled 2022-12-22 (×3): qty 20

## 2022-12-22 MED ORDER — ACETAMINOPHEN 650 MG RE SUPP: 650.0000 mg | Freq: Four times a day (QID) | RECTAL | Status: DC | PRN

## 2022-12-22 MED ORDER — ACETAMINOPHEN 325 MG PO TABS
650.0000 mg | ORAL_TABLET | Freq: Four times a day (QID) | ORAL | Status: DC | PRN
  Administered 2022-12-28: 650 mg via ORAL
  Filled 2022-12-22: qty 2

## 2022-12-22 MED ORDER — BACLOFEN 20 MG PO TABS: 20.0000 mg | ORAL_TABLET | Freq: Two times a day (BID) | ORAL | Status: DC

## 2022-12-22 MED ORDER — FINASTERIDE 5 MG PO TABS: 5.0000 mg | ORAL_TABLET | Freq: Every day | ORAL | Status: DC

## 2022-12-22 MED ORDER — SODIUM CHLORIDE 0.9 % IV SOLN: 2.0000 g | Freq: Three times a day (TID) | INTRAVENOUS | Status: DC

## 2022-12-22 MED ORDER — DIAZEPAM 10 MG RE GEL: 10.0000 mg | Freq: Every morning | RECTAL | Status: DC

## 2022-12-22 MED ORDER — ONDANSETRON HCL 4 MG/2ML IJ SOLN: 4.0000 mg | Freq: Four times a day (QID) | INTRAMUSCULAR | Status: DC | PRN

## 2022-12-22 MED ORDER — LATANOPROST 0.005 % OP SOLN
1.0000 [drp] | Freq: Every day | OPHTHALMIC | Status: DC
  Administered 2022-12-22 – 2023-01-07 (×17): 1 [drp] via OPHTHALMIC
  Filled 2022-12-22: qty 2.5

## 2022-12-22 MED ORDER — SODIUM CHLORIDE 0.9 % IV SOLN
2.0000 g | Freq: Once | INTRAVENOUS | Status: DC
  Filled 2022-12-22: qty 12.5

## 2022-12-22 NOTE — Progress Notes (Signed)
Pharmacy Antibiotic Note  [name] is a 67 y.o. male admitted on 12/22/2022 with UTI.  Pharmacy has been consulted for Cefepime dosing.  Active Problem(s): directly admit to futher evaluate and rule out infectious process with leukocytosis, fatigue, dry cough, watery colostomy output, - still recovering from his COVID infection that was diagnosed on 11/29/2022 s/p Paxlovid - He was started on Levaquin 3 days ago due to elevated WBC   PMH: OSA, adrenal insuff (chronic dexameth), anemia,  gG Lambda Multiple Myeloma, h/o DVT on anticoagulation, DM, Multiple myeloma, neurogenic bladder, neurogenic bowel, paraplegia, pSBO 2011  ID: Empiric abx for UTI (OP started on Levaquin, has h/o Proteus UTI) Leukocytosis 26.7, Afebrile, sats 100%, CXR 5/28 WNL, Scr 0.75  -acyclovir 400mg  BID for VZV prophylaxis  - Cefepime 5/31>>  Plan: Cefepime 2g IV q8hr-ok for CrCl>60 Pharmacy will sign off. Please reconsult for further dosing assitance.      Temp (24hrs), Avg:97 F (36.1 C), Min:97 F (36.1 C), Max:97 F (36.1 C)  Recent Labs  Lab 12/22/22 0902  WBC 26.7*  CREATININE 0.75    Estimated Creatinine Clearance: 105.6 mL/min (by C-G formula based on SCr of 0.75 mg/dL).    Allergies  Allergen Reactions   Ferumoxytol Nausea And Vomiting and Other (See Comments)    Felt like he was going to pass out.  chilling, Felt like he was going to pass out.    Velta Rockholt S. Merilynn Finland, PharmD, BCPS Clinical Staff Pharmacist [email or web address]  Pasty Spillers 12/22/2022 3:01 PM

## 2022-12-22 NOTE — Progress Notes (Signed)
[name] [phone]   [phone]  PROGRESS NOTE  Patient Care Team: Jethro Bastos, MD as PCP - General (Family Medicine) Ranelle Oyster, MD as Consulting Physician (Physical Medicine and Rehabilitation) Hildred Alamin Mcarthur Rossetti, PA-C as Physician Assistant (Physical Medicine and Rehabilitation) Jaci Standard, MD as Consulting Physician (Hematology and Oncology)  Hematological/Oncological History # IgG Lambda Multiple Myeloma, Relapsed (ISS Stage II) 1) 06/2010: initial diagnosis of Multiple Myeloma after T8 compression fracture. Treated with Velcade/Revlimid/Dexamethasone and achieved a complete remission 2) Velcade was discontinued in September 2012 and that Revlimid and Decadron were discontinued in March 2013. 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 4) Followed by Dr. Bertis Ruddy, last clinic visit 10/09/2019. At that time there was concern for relapse of his multiple myeloma.  5) Patient requested transfer to different provider after misunderstanding regarding imaging studies 6) 12/17/2019: transfer care to Dr. Leonides Schanz  7) 01/09/2020: Cycle 1 Day 1 of Dara/Velcade/Dex 8) 01/21/2020: presented as urgent visit for diarrhea and dehydration. Holding chemotherapy scheduled for 01/23/2020. 9) 01/30/2020: Resume dara/velcade/dex after resolution of diarrhea.  10) 02/13/2020: restaging labs show M protein 0.8, Kappa 4.5, lamba 17.2, ratio 0.26, urine M protein 53 (7.1%). All MM labs indicate improvement.  11) 03/10/2020: Cycle 4 Day 1 of Dara/Velcade/Dex. Transition to q 3 week daratumumab.  12) 06/04/2020:  Cycle 8 Day 1 of Dara/Velcade/Dex 13) 06/25/2020:  Cycle 9 Day 1 of Dara/Velcade/Dex 14) 07/22/2020: Cycle 10 Day 1 of  Dara//Dex 15) 08/18/2020: Cycle 11 Day 1 of Dara//Dex 16) 09/23/2020: Cycle 12 Day 1 of Dara//Dex 17) 10/22/2020: Cycle 13 Day 1 of Dara/Dex   18) 11/19/2020: Cycle 14 Day 1 of Dara/Dex  19) 12/17/2020: Cycle 15 Day 1 of Dara/Dex  20) 01/17/2021: Cycle 16 Day 1 of Dara/Dex  21) 02/11/2021: Cycle 17 Day 1 of Dara/Dex 22) 03/10/2021: Cycle 18 Day 1 of Dara/Dex  23) 04/08/2021: Cycle 19 Day 1 of Dara/Dex  24) 08/03/2021: Cycle 20 Day 1 of Dara/Dex (delayed due to scheduling error) 25) 09/02/2021: Cycle 21 Day 1 of Dara/Dex 26) 09/30/2021: Cycle 22 Day 1 of Dara/Dex 27) 10/28/2021: Cycle 23 Day 1 of Dara/Dex 28) 12/02/2021: Cycle 24 Day 1 of Dara/Dex 29) 12/30/2021: Cycle 25 Day 1 of Dara/Dex 30) 01/06/2022: Cycle 1 Day 1 of Kyprolis/Dex 31) 02/03/2022: Cycle 2 Day 1 of Kyprolis/Dex 32) 02/24/2022: Cycle 3 Day 1 of Kyprolis/Dex 33) 04/07/2022: Cycle 4 Day 1 of Kyprolis/Dex 34) 05/05/2022: Cycle 5 Day 1 of Kyprolis/Dex 35) 06/01/2022: Cycle 6 Day 1 of Kyprolis/Dex 36) 06/29/2022: Cycle 7 Day 1 of Kyprolis/Dex 37) 07/28/2022: Cycle 8 Day 1 of Kyprolis/Dex 38) 08/25/2022: Cycle 9 Day 1 of Kyprolis/Dex 39) 09/22/2022: Cycle 10 Day 1 of Kyprolis/Dex 40) 10/20/2022: Cycle 11 Day 1 of Kyprolis/Dex 41) 11/17/2022: Cycle 12 Day 1 of Kyprolis/Dex  Interval History:  [name] 67 y.o. male with medical history significant for  IgG Lambda Multiple Myeloma who presents for a follow up visit. The patient's last visit was on 11/17/2022.  He presents today to for cycle 13 day 8 of Kyprolis/Dex.   On exam today [name] reports he is still recovering from his COVID infection that was diagnosed on 11/29/2022. He reports extremely fatigue  and has a residual dry cough. He only had one episode of nausea/vomiting last week. He had water output from his colostomy for several days. He has not had  any output yesterday or today. He reports possible, low grade fever yesterday with Tmax of 99 F. He was started on Levaquin 3 days ago due to elevated WBC when he was evaluated [facility]. He denies chills, night sweats, shortness of breath, chest pain or  abdominal pain.  He has no other complaints.  Rest of the 10 point ROS is below.   MEDICAL HISTORY:  Past Medical History:  Diagnosis Date   Adrenal insufficiency (HCC)    on chronic dexamethasone   Anemia    Cancer (HCC)    Coagulopathy (HCC)    on xeralto/ s/p DVT while on coumadin,  IVC in place   Diabetes mellitus without complication (HCC)    type 2   Gross hematuria 7/14   post foley cath procedure   History of blood transfusion 7/14   Multiple myeloma    thoracic T8 with paraplegia s/p resection- on chemo at visit 10/13/10   Multiple myeloma    Multiple myeloma without mention of remission    Neurogenic bladder    Neurogenic bowel    Paraplegia (HCC)    Partial small bowel obstruction (HCC) during dec 2011 admission    SURGICAL HISTORY: Past Surgical History:  Procedure Laterality Date   COLONOSCOPY WITH PROPOFOL N/A 04/12/2017   Procedure: COLONOSCOPY WITH PROPOFOL;  Surgeon: Hilarie Fredrickson, MD;  Location: [facility];  Service: Endoscopy;  Laterality: N/A;   COLONOSCOPY WITH PROPOFOL N/A 04/19/2017   Procedure: COLONOSCOPY WITH PROPOFOL;  Surgeon: Benancio Deeds, MD;  Location: [facility];  Service: Gastroenterology;  Laterality: N/A;   COLOSTOMY  07/20/2011   Procedure: COLOSTOMY;  Surgeon: Rulon Abide, DO;  Location: [facility] OR;  Service: General;;   COLOSTOMY REVISION  07/20/2011   Procedure: COLON RESECTION SIGMOID;  Surgeon: Rulon Abide, DO;  Location: [facility] OR;  Service: General;;   CYSTOSCOPY N/A 04/04/2013   Procedure: CYSTOSCOPY WITH LITHALOPAXY;  Surgeon: Sebastian Ache, MD;  Location: [facility];  Service: Urology;  Laterality: N/A;   INSERTION OF SUPRAPUBIC CATHETER N/A 04/04/2013   Procedure: INSERTION OF SUPRAPUBIC CATHETER;  Surgeon: Sebastian Ache, MD;  Location: [facility];  Service: Urology;  Laterality: N/A;   LAPAROTOMY  07/20/2011   Procedure: EXPLORATORY LAPAROTOMY;  Surgeon: Rulon Abide, DO;  Location: [facility] OR;  Service: General;   Laterality: N/A;   myeloma thoracic T8 with parpaplegia s/p thoracotomy and thoracic T7-9 cage placement on Dec 26th 2011  07/18/10    SOCIAL HISTORY: Social History   Socioeconomic History   Marital status: Married    Spouse name: Not on file   Number of children: Not on file   Years of education: Not on file   Highest education level: Not on file  Occupational History   Not on file  Tobacco Use   Smoking status: Never   Smokeless tobacco: Never  Vaping Use   Vaping Use: Never used  Substance and Sexual Activity   Alcohol use: No   Drug use: No   Sexual activity: Never  Other Topics Concern   Not on file  Social History Narrative   Not on file   Social Determinants of Health   Financial Resource Strain: Not on file  Food Insecurity: Not on file  Transportation Needs: Not on file  Physical Activity: Not on file  Stress: Not on file  Social Connections: Not on file  Intimate Partner Violence: Not on file    FAMILY HISTORY: Family History  Problem Relation  Age of Onset   Ovarian cancer Mother    Diabetes Father     ALLERGIES:  is allergic to ferumoxytol.  MEDICATIONS:  Current Outpatient Medications  Medication Sig Dispense Refill   acetaminophen (TYLENOL) 325 MG tablet Take 650 mg by mouth every 4 (four) hours as needed.     chlorhexidine (PERIDEX) 0.12 % solution 15 mL.     cholecalciferol (VITAMIN D3) 25 MCG (1000 UNIT) tablet Take by mouth.     finasteride (PROSCAR) 5 MG tablet Take 5 mg by mouth daily.     metFORMIN (GLUCOPHAGE) 500 MG tablet Take 500 mg by mouth 2 (two) times daily with a meal.     acyclovir (ZOVIRAX) 400 MG tablet Take 1 tablet (400 mg total) by mouth 2 (two) times daily. 60 tablet 2   atorvastatin (LIPITOR) 10 MG tablet Take 10 mg by mouth daily.     baclofen (LIORESAL) 20 MG tablet Take 20 mg by mouth 2 (two) times daily.     diazepam (DIASTAT ACUDIAL) 10 MG GEL SMARTSIG:By Mouth     dorzolamide (TRUSOPT) 2 % ophthalmic solution 1  drop 2 (two) times daily.     iron polysaccharides (NU-IRON) 150 MG capsule Take 1 capsule (150 mg total) by mouth daily. 30 capsule 0   latanoprost (XALATAN) 0.005 % ophthalmic solution Place 1 drop into both eyes at bedtime.     ondansetron (ZOFRAN) 8 MG tablet Take 1 tablet (8 mg total) by mouth every 8 (eight) hours as needed for nausea or vomiting. 30 tablet 0   rivaroxaban (XARELTO) 10 MG TABS tablet Take 1 tablet (10 mg total) by mouth daily with supper. 30 tablet 9   Skin Protectants, Misc. (EUCERIN) cream Apply 1 application topically 2 (two) times daily as needed for dry skin.      zinc oxide (BALMEX) 11.3 % CREA cream Apply 1 application topically 2 (two) times daily.     No current facility-administered medications for this visit.   Facility-Administered Medications Ordered in Other Visits  Medication Dose Route Frequency Provider Last Rate Last Admin   sodium chloride flush (NS) 0.9 % injection 10 mL  10 mL Intravenous PRN Bertis Ruddy, Ni, MD        REVIEW OF SYSTEMS:   Constitutional: ( - ) fevers, ( - )  chills , ( - ) night sweats Eyes: ( - ) blurriness of vision, ( - ) double vision, ( - ) watery eyes Ears, nose, mouth, throat, and face: ( - ) mucositis, ( - ) sore throat Respiratory: ( - ) cough, ( - ) dyspnea, ( - ) wheezes Cardiovascular: ( - ) palpitation, ( - ) chest discomfort, ( - ) lower extremity swelling Gastrointestinal:  ( - ) nausea, ( - ) heartburn, ( - ) change in bowel habits Skin: ( - ) abnormal skin rashes Lymphatics: ( - ) new lymphadenopathy, ( - ) easy bruising Neurological: ( - ) numbness, ( - ) tingling, ( - ) new weaknesses Behavioral/Psych: ( - ) mood change, ( - ) new changes  All other systems were reviewed with the patient and are negative.  PHYSICAL EXAMINATION: ECOG PERFORMANCE STATUS: paraplegic.   Vitals:   11/17/22 1206  BP: 139/85  Pulse: 77  Resp: 14  Temp: (!) 97.3 F (36.3 C)  SpO2: 100%    Filed Weights   11/17/22 1206   Weight: 253 lb (114.8 kg)      GENERAL: well appearing middle aged [place] American male alert,  no distress and comfortable SKIN: skin color, texture, turgor are normal, no rashes or significant lesions EYES: conjunctiva are pink and non-injected, sclera clear LUNGS: clear to auscultation and percussion with normal breathing effort HEART: regular rate & rhythm and no murmurs Musculoskeletal: no cyanosis of digits and no clubbing  PSYCH: alert & oriented x 3, fluent speech NEURO: paraplegic, no use of LE bilaterally.   LABORATORY DATA:  I have reviewed the data as listed    Latest Ref Rng & Units 11/17/2022   10:38 AM 11/03/2022    9:20 AM 10/27/2022    9:55 AM  CBC  WBC 4.0 - 10.5 K/uL 6.7  7.8  9.0   Hemoglobin 13.0 - 17.0 g/dL 16.1  09.6  04.5   Hematocrit 39.0 - 52.0 % 34.9  34.3  34.2   Platelets 150 - 400 K/uL 433  206  213        Latest Ref Rng & Units 11/17/2022   10:38 AM 11/03/2022    9:20 AM 10/27/2022    9:55 AM  CMP  Glucose 70 - 99 mg/dL 409  811  914   BUN 8 - 23 mg/dL 9  15  13    Creatinine 0.61 - 1.24 mg/dL 7.82  9.56  2.13   Sodium 135 - 145 mmol/L 144  144  145   Potassium 3.5 - 5.1 mmol/L 3.8  3.6  3.9   Chloride 98 - 111 mmol/L 110  112  111   CO2 22 - 32 mmol/L 29  27  27    Calcium 8.9 - 10.3 mg/dL 9.5  9.0  9.8   Total Protein 6.5 - 8.1 g/dL 6.3  6.0  6.2   Total Bilirubin 0.3 - 1.2 mg/dL 0.7  0.7  0.8   Alkaline Phos 38 - 126 U/L 88  90  88   AST 15 - 41 U/L 12  10  9    ALT 0 - 44 U/L 13  14  15      Lab Results  Component Value Date   MPROTEIN 0.2 (H) 10/20/2022   MPROTEIN 0.2 (H) 09/22/2022   MPROTEIN 0.2 (H) 08/25/2022   Lab Results  Component Value Date   KPAFRELGTCHN 12.4 10/20/2022   KPAFRELGTCHN 10.3 09/22/2022   KPAFRELGTCHN 17.1 08/25/2022   LAMBDASER 9.9 10/20/2022   LAMBDASER 9.9 09/22/2022   LAMBDASER 11.0 08/25/2022   KAPLAMBRATIO 1.25 10/20/2022   KAPLAMBRATIO 1.04 09/22/2022   KAPLAMBRATIO 1.55 08/25/2022     RADIOGRAPHIC STUDIES: No results found.  ASSESSMENT & PLAN KANAI DEBOLT 67 y.o. male with medical history significant for  IgG Lambda Multiple Myeloma who presents for a follow up visit.  #Leukocytosis--neutrophil predominant: --Patient was diagnosed with COVID on 11/29/2022 treated with Paxlovid. --Was evaluated by NP at [facility] and started on Levaquin (on day 3 today) after WBC 24.4.  --Chest xray from 12/19/2022 did not show any acute cardiopulmonary process.  --Patient is afebrile and O2 is 100%.  --Will directly admit to futher evaluate and rule out infectious process. Labs today to check urine and blood cultures, sed rate, CRP, iron panel. --Will give 1 L of IV fluids in infusion room while patient waits for bed in hospital. --Dr. Leonides Schanz will follow while patient is hospitalized.   # IgG Lambda Multiple Myeloma, Relapsed (ISS Stage II) --findings are most consistent with relapsed multiple myeloma. Patient previously successfully treated with Velcade/Rev/Dex and Daratumumab/Velcade/Dex. On 07/02/2020 he transitioned to monthly daratumumab alone.  --due to to rise in M  protein, switched to Kyprolis and Dexamethasone on 01/06/2022 Plan: --Labs show white blood cell count 26.7, hemoglobin 9.6, MCV 77.1, and platelets of 409, Creatinine and LFTs are normal.  --HOLD treatment today. Defer to next week or until patient is discharged from hospital.    #History of DVT --He had placement of IVC filter, remains on Xarelto. --Due to poor mobility, and lack of bleeding complications, recommend to continue on Xarelto indefinitely. --caution if Plt count were to drop <50  #Snoring/Risk for OSA: --Diagnosed with OSA and has CPAP machine.   # Supportive Care -- provided patient with an albuterol inhaler (for use with daratumumab) --acyclovir 400mg  BID for VZV prophylaxis --zofran 8mg  q8H PRN for nausea/vomiting  --Zometa is being held in the setting of his prior episode of osteonecrosis of  the jaw  No orders of the defined types were placed in this encounter.   All questions were answered. The patient knows to call the clinic with any problems, questions or concerns.  I have spent a total of 30 minutes minutes of face-to-face and non-face-to-face time, preparing to see the patient,  performing a medically appropriate examination, counseling and educating the patient, communicating with other health care professionals, documenting clinical information in the electronic health record,  and care coordination.   Georga Kaufmann PA-C Dept of Hematology and Oncology [facility] Phone: [phone]    11/17/2022 1:48 PM   Literature Support:  Milagros Loll, Petrucci MT, Early, [place], [place], Roby, Spada S, [place], Ponticelli E, Holcomb, Cavo M, Di Toritto TC, Haydee Salter F, [place] V, Palumbo A, Boccadoro M, Larocca A. Once-weekly versus twice-weekly carfilzomib in patients with newly diagnosed multiple myeloma: a pooled analysis of two phase I/II studies. Haematologica. 2019 Aug;104(8):1640-1647.  --Once-weekly 70 mg/m2 carfilzomib as induction and maintenance therapy for newly diagnosed multiple myeloma patients was as safe and effective as twice-weekly 36 mg/m2 carfilzomib and provided a more convenient schedule.

## 2022-12-22 NOTE — Progress Notes (Signed)
Report called to [facility] RN for room [phone]. Patient transported via wheelchair and right forearm PIV in place. With use of hoyer lift patient was assisted to the bed with call bell within reach. Patient spouse also made aware of patient transfer to room 1602.

## 2022-12-22 NOTE — H&P (Signed)
History and Physical    Patient: [name]:[id number] DOB: 1956/06/12 DOA: 12/22/2022 DOS: the patient was seen and examined on 12/22/2022 PCP: Jethro Bastos, MD  Patient coming from: Home  Chief Complaint: No chief complaint on file.  HPI: [name] is a 67 y.o. 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  He had an episode of emesis last week.  He has had increased output from his colostomy over the past few days, but no significant output since yesterday.  However, he has not being eating much. He denied chills, rhinorrhea, sore throat, wheezing or hemoptysis.  URI symptoms resolved about 2 weeks ago, but he is still having fatigue and decreased appetite.  No chest pain, palpitations, diaphoresis, PND, orthopnea or pitting edema of the lower extremities.   Vital signs earlier today at the cancer center were temperature 97 F, pulse on room 4, respiration 18, BP 107/71 mmHg O2 sat 100% on room air.  The patient received 1000 mL of normal saline bolus, blood and urine cultures were taken.  Lab work: CBC showed a white count 26.7 with 89% neutrophils, hemoglobin 9.6 g/dL platelets 811.  ESR was 99 mm/h.  CMP showed a nonfasting glucose of 125 mg/dL and albumin of 3.3 g/dL.  The rest of the CMP measurements were normal.  Iron was 18 mcg/dL and saturation rate was  10%.  Imaging: Portable 1 view chest radiograph done earlier this week with low lung volumes, but no acute cardiopulmonary process.   Review of Systems: As mentioned in the history of present illness. All other systems reviewed and are negative.  Past Medical History:  Diagnosis Date   Adrenal insufficiency (HCC)    on chronic dexamethasone   Anemia    Cancer (HCC)    Coagulopathy (HCC)    on xeralto/ s/p DVT while on coumadin,  IVC in place   Diabetes mellitus without complication (HCC)    type 2   Gross hematuria 7/14   post foley cath procedure   History of blood transfusion 7/14   Multiple myeloma    thoracic T8 with paraplegia s/p resection- on chemo at visit 10/13/10   Multiple myeloma    Multiple myeloma without mention of remission    Neurogenic bladder    Neurogenic bowel    Paraplegia (HCC)    Partial small bowel obstruction (HCC) during dec 2011 admission   Past Surgical History:  Procedure Laterality Date   COLONOSCOPY WITH PROPOFOL N/A 04/12/2017   Procedure: COLONOSCOPY WITH PROPOFOL;  Surgeon: Hilarie Fredrickson, MD;  Location: [facility];  Service: Endoscopy;  Laterality: N/A;   COLONOSCOPY WITH PROPOFOL N/A 04/19/2017   Procedure: COLONOSCOPY WITH PROPOFOL;  Surgeon: Benancio Deeds, MD;  Location: [facility];  Service: Gastroenterology;  Laterality: N/A;   COLOSTOMY  07/20/2011   Procedure: COLOSTOMY;  Surgeon: Rulon Abide, DO;  Location: [facility] OR;  Service: General;;   COLOSTOMY REVISION  07/20/2011   Procedure: COLON  RESECTION SIGMOID;  Surgeon: Rulon Abide, DO;  Location: [facility] OR;  Service: General;;   CYSTOSCOPY N/A 04/04/2013   Procedure: CYSTOSCOPY WITH LITHALOPAXY;  Surgeon: Sebastian Ache, MD;  Location: [facility];  Service: Urology;  Laterality: N/A;   INSERTION OF SUPRAPUBIC CATHETER N/A 04/04/2013   Procedure: INSERTION OF SUPRAPUBIC CATHETER;  Surgeon: Sebastian Ache, MD;  Location: [facility];  Service: Urology;  Laterality: N/A;   LAPAROTOMY   07/20/2011   Procedure: EXPLORATORY LAPAROTOMY;  Surgeon: Rulon Abide, DO;  Location: [facility] OR;  Service: General;  Laterality: N/A;   myeloma thoracic T8 with parpaplegia s/p thoracotomy and thoracic T7-9 cage placement on Dec 26th 2011  07/18/10   Social History:  reports that he has never smoked. He has never used smokeless tobacco. He reports that he does not drink alcohol and does not use drugs.  Allergies  Allergen Reactions   Ferumoxytol Nausea And Vomiting and Other (See Comments)    Felt like he was going to pass out.  chilling, Felt like he was going to pass out.    Family History  Problem Relation Age of Onset   Ovarian cancer Mother    Diabetes Father     Prior to Admission medications   Medication Sig Start Date End Date Taking? Authorizing Provider  acetaminophen (TYLENOL) 325 MG tablet Take 650 mg by mouth every 4 (four) hours as needed. 09/21/20   [provider]  acyclovir (ZOVIRAX) 400 MG tablet Take 1 tablet (400 mg total) by mouth 2 (two) times daily. 08/18/20   Jaci Standard, MD  atorvastatin (LIPITOR) 10 MG tablet Take 10 mg by mouth daily.    [provider]  baclofen (LIORESAL) 20 MG tablet Take 20 mg by mouth 2 (two) times daily.    [provider]  chlorhexidine (PERIDEX) 0.12 % solution 15 mL. 09/21/20   [provider]  cholecalciferol (VITAMIN D3) 25 MCG (1000 UNIT) tablet Take by mouth. 09/21/20   [provider]  diazepam (DIASTAT ACUDIAL) 10 MG GEL SMARTSIG:By Mouth 12/30/19   [provider]  dorzolamide (TRUSOPT) 2 % ophthalmic solution 1 drop 2 (two) times daily. 12/30/19   [provider]  finasteride (PROSCAR) 5 MG tablet Take 5 mg by mouth daily. 09/21/20   [provider]  iron polysaccharides (NU-IRON) 150 MG capsule Take 1 capsule (150 mg total) by mouth daily. 04/22/17   Regalado, Belkys A, MD  latanoprost (XALATAN) 0.005 % ophthalmic solution Place 1 drop into both eyes at  bedtime.    [provider]  ondansetron (ZOFRAN) 8 MG tablet Take 1 tablet (8 mg total) by mouth every 8 (eight) hours as needed for nausea or vomiting. 07/28/22   Jaci Standard, MD  rivaroxaban (XARELTO) 10 MG TABS tablet Take 1 tablet (10 mg total) by mouth daily with supper. 03/26/18   Artis Delay, MD  Skin Protectants, Misc. (EUCERIN) cream Apply 1 application topically 2 (two) times daily as needed for dry skin.     [provider]  zinc oxide (BALMEX) 11.3 % CREA cream Apply 1 application topically 2 (two) times daily.    [provider]    Physical Exam: There were no vitals filed for this visit.  Physical Exam Vitals and nursing note reviewed.  Constitutional:      General: He is awake. He is not in acute distress.    Appearance: Normal appearance. He is ill-appearing.  HENT:     Head: Normocephalic.  Nose: No rhinorrhea.     Mouth/Throat:     Mouth: Mucous membranes are dry.  Eyes:     General: No scleral icterus.    Pupils: Pupils are equal, round, and reactive to light.  Neck:     Vascular: No JVD.  Cardiovascular:     Rate and Rhythm: Normal rate and regular rhythm.     Heart sounds: S1 normal and S2 normal.  Pulmonary:     Effort: Pulmonary effort is normal.     Breath sounds: No wheezing, rhonchi or rales.  Abdominal:     General: The ostomy site is clean. Bowel sounds are normal.     Palpations: Abdomen is soft.     Tenderness: There is no abdominal tenderness.     Comments: Mildly distended.  Musculoskeletal:     Cervical back: Neck supple.     Right lower leg: No edema.     Left lower leg: No edema.  Skin:    General: Skin is warm and dry.  Neurological:     Mental Status: He is alert and oriented to person, place, and time. Mental status is at baseline.     Motor: Atrophy present.     Comments: Absent sensorium sensation and motor function of lower extremities due paraplegia.  Psychiatric:        Mood and Affect: Mood  normal.        Behavior: Behavior normal. Behavior is cooperative.     Data Reviewed:  Results are pending, will review when available.  Assessment and Plan: Principal Problem:   SIRS (systemic inflammatory response syndrome) (HCC) Likely due to UTI. MedSurg/inpatient. Continue IV fluids. Initiate ceftriaxone 2 g IVPB. Follow-up urine culture and sensitivity Follow-up blood culture and sensitivity Follow CBC and CMP in a.m.  Active Problems:   Multiple myeloma in relapse [facility]) Follow-up with oncology as scheduled.    Iron deficiency anemia Monitor hematocrit and hemoglobin. Transfuse as needed.    Hypoalbuminemia Nutrition has been poor this month. Protein supplementation as needed. Follow-up albumin level.    History of DVT (deep vein thrombosis) Continue Xarelto 10 mg p.o. daily with supper.    Type 2 diabetes mellitus with obesity (HCC) Carbohydrate modified diet.   Check hemoglobin A1c. CBG monitoring with RI SS.    OSA (obstructive sleep apnea) Declined to use CPAP at bedtime.    Glaucoma Continue dorzolamide and latanoprost drops. Follow-up with ophthalmology as an outpatient.    Paraplegia [facility])   Neurogenic bladder   Neurogenic bowel Supportive care as needed.   Advance Care Planning:   Code Status: Full Code   Consults:   Family Communication:   Severity of Illness: The appropriate patient status for this patient is INPATIENT. Inpatient status is judged to be reasonable and necessary in order to provide the required intensity of service to ensure the patient's safety. The patient's presenting symptoms, physical exam findings, and initial radiographic and laboratory data in the context of their chronic comorbidities is felt to place them at high risk for further clinical deterioration. Furthermore, it is not anticipated that the patient will be medically stable for discharge from the hospital within 2 midnights of admission.   * I certify that at  the point of admission it is my clinical judgment that the patient will require inpatient hospital care spanning beyond 2 midnights from the point of admission due to high intensity of service, high risk for further deterioration and high frequency of surveillance required.*  Author: Ernestene Kiel  Robb Matar, MD 12/22/2022 2:36 PM  For on call review [email or web address].   This document was prepared using Dragon voice recognition software and may contain some unintended transcription errors.

## 2022-12-23 ENCOUNTER — Encounter (HOSPITAL_COMMUNITY): Payer: Self-pay

## 2022-12-23 ENCOUNTER — Other Ambulatory Visit: Payer: Self-pay

## 2022-12-23 DIAGNOSIS — R6510 Systemic inflammatory response syndrome (SIRS) of non-infectious origin without acute organ dysfunction: Secondary | ICD-10-CM | POA: Diagnosis not present

## 2022-12-23 DIAGNOSIS — G8220 Paraplegia, unspecified: Secondary | ICD-10-CM

## 2022-12-23 DIAGNOSIS — N319 Neuromuscular dysfunction of bladder, unspecified: Secondary | ICD-10-CM

## 2022-12-23 LAB — COMPREHENSIVE METABOLIC PANEL
ALT: 30 U/L (ref 0–44)
AST: 37 U/L (ref 15–41)
Albumin: 2.1 g/dL — ABNORMAL LOW (ref 3.5–5.0)
Alkaline Phosphatase: 84 U/L (ref 38–126)
Anion gap: 7 (ref 5–15)
BUN: 8 mg/dL (ref 8–23)
CO2: 21 mmol/L — ABNORMAL LOW (ref 22–32)
Calcium: 7.9 mg/dL — ABNORMAL LOW (ref 8.9–10.3)
Chloride: 109 mmol/L (ref 98–111)
Creatinine, Ser: 0.66 mg/dL (ref 0.61–1.24)
GFR, Estimated: >60 mL/min (ref >60)
Glucose, Bld: 87 mg/dL (ref 70–99)
Potassium: 3.3 mmol/L — ABNORMAL LOW (ref 3.5–5.1)
Sodium: 137 mmol/L (ref 135–145)
Total Bilirubin: 0.8 mg/dL (ref 0.3–1.2)
Total Protein: 5.6 g/dL — ABNORMAL LOW (ref 6.5–8.1)

## 2022-12-23 LAB — CBC WITH DIFFERENTIAL/PLATELET
Abs Immature Granulocytes: 0.19 10*3/uL — ABNORMAL HIGH (ref 0.00–0.07)
Basophils Absolute: 0.1 10*3/uL (ref 0.0–0.1)
Basophils Relative: 0 %
Eosinophils Absolute: 0.2 10*3/uL (ref 0.0–0.5)
Eosinophils Relative: 1 %
HCT: 24 % — ABNORMAL LOW (ref 39.0–52.0)
Hemoglobin: 8 g/dL — ABNORMAL LOW (ref 13.0–17.0)
Immature Granulocytes: 1 %
Lymphocytes Relative: 4 %
Lymphs Abs: 1 10*3/uL (ref 0.7–4.0)
MCH: 26.3 pg (ref 26.0–34.0)
MCHC: 33.3 g/dL (ref 30.0–36.0)
MCV: 78.9 fL — ABNORMAL LOW (ref 80.0–100.0)
Monocytes Absolute: 1.9 10*3/uL — ABNORMAL HIGH (ref 0.1–1.0)
Monocytes Relative: 8 %
Neutro Abs: 19.6 10*3/uL — ABNORMAL HIGH (ref 1.7–7.7)
Neutrophils Relative %: 86 %
Platelets: 337 10*3/uL (ref 150–400)
RBC: 3.04 MIL/uL — ABNORMAL LOW (ref 4.22–5.81)
RDW: 19.4 % — ABNORMAL HIGH (ref 11.5–15.5)
WBC: 23 10*3/uL — ABNORMAL HIGH (ref 4.0–10.5)
nRBC: 0 % (ref 0.0–0.2)

## 2022-12-23 LAB — URINE CULTURE: Culture: >=100000 — AB

## 2022-12-23 LAB — GLUCOSE, CAPILLARY
Glucose-Capillary: 92 mg/dL (ref 70–99)
Glucose-Capillary: 99 mg/dL (ref 70–99)
Glucose-Capillary: 115 mg/dL — ABNORMAL HIGH (ref 70–99)
Glucose-Capillary: 129 mg/dL — ABNORMAL HIGH (ref 70–99)

## 2022-12-23 LAB — CULTURE, BLOOD (SINGLE): Culture: NO GROWTH

## 2022-12-23 LAB — MAGNESIUM: Magnesium: 1.6 mg/dL — ABNORMAL LOW (ref 1.7–2.4)

## 2022-12-23 LAB — HIV ANTIBODY (ROUTINE TESTING W REFLEX): HIV Screen 4th Generation wRfx: NONREACTIVE

## 2022-12-23 LAB — PHOSPHORUS: Phosphorus: 2.4 mg/dL — ABNORMAL LOW (ref 2.5–4.6)

## 2022-12-23 MED ORDER — K PHOS MONO-SOD PHOS DI & MONO 155-852-130 MG PO TABS
500.0000 mg | ORAL_TABLET | Freq: Two times a day (BID) | ORAL | Status: AC
  Administered 2022-12-23 (×2): 500 mg via ORAL
  Filled 2022-12-23 (×2): qty 2

## 2022-12-23 MED ORDER — MAGNESIUM SULFATE 2 GM/50ML IV SOLN
2.0000 g | Freq: Once | INTRAVENOUS | Status: AC
  Administered 2022-12-23: 2 g via INTRAVENOUS
  Filled 2022-12-23: qty 50

## 2022-12-23 MED ORDER — POTASSIUM CHLORIDE CRYS ER 20 MEQ PO TBCR
40.0000 meq | EXTENDED_RELEASE_TABLET | Freq: Once | ORAL | Status: AC
  Administered 2022-12-23: 40 meq via ORAL
  Filled 2022-12-23: qty 2

## 2022-12-23 NOTE — Progress Notes (Signed)
PROGRESS NOTE    [name]  ZOX:[id number] DOB: Feb 16, 1956 DOA: 12/22/2022 PCP: Jethro Bastos, MD   Brief Narrative:   67 y.o. 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  ESR was 99.  Chest x-ray showed no acute cardiopulmonary process.  UA was suggestive of UTI.  He was started on IV fluids and antibiotics.  Assessment & Plan:   UTI: Present on admission, possibly associated with neurogenic bladder SIRS Leukocytosis -Currently on Rocephin.  Follow urine and blood cultures. -WBCs improving to 23 today  Multiple myeloma in relapse -Currently undergoing chemotherapy as an outpatient as per oncology.  Outpatient follow-up with oncology  Iron deficiency anemia--continue oral iron supplementation.  Transfuse if hemoglobin is less than 7  Hypoalbuminemia -Consult nutrition.  History of DVT -Continue Xarelto  Diabetes mellitus type 2 -Blood sugars controlled.  Carb modified diet.  OSA -Declined to use CPAP at bedtime  Hypokalemia -Replace.  Repeat a.m. labs  Hypomagnesemia -Replace.  Repeat a.m. labs  Paraplegia with neurogenic bowel and bladder -Continue supportive care.  Fall precautions.  OT eval.  Thrombocytosis -Resolved  Obesity -Outpatient follow-up  DVT prophylaxis: Xarelto Code Status: Full Family Communication: None at bedside Disposition  Plan: Status is: Inpatient Remains inpatient appropriate because: Of severity of illness  Consultants: None  Procedures: None  Antimicrobials: Rocephin from 12/22/2022 onwards   Subjective: Patient seen and examined at bedside.  Feels slightly better.  Appetite improving.  No fever, vomiting, worsening shortness of breath reported.  Feels weak.  Objective: Vitals:   12/22/22 1500 12/22/22 2338 12/23/22 0345  BP: 126/68 124/66 118/65  Pulse: 100 (!) 101 97  Resp: 18  18  Temp: 98.2 F (36.8 C) 98.5 F (36.9 C) 99.3 F (37.4 C)  TempSrc: Oral Oral Oral  SpO2: 100% 98% 94%  Weight: 109.8 kg    Height: 5\' 8"  (1.727 m)      Intake/Output Summary (Last 24 hours) at 12/23/2022 1056 Last data filed at 12/23/2022 1024 Gross per 24 hour  Intake 2217.87 ml  Output 1300 ml  Net 917.87 ml   Filed Weights   12/22/22 1500  Weight: 109.8 kg    Examination:  General exam: Appears calm and comfortable.  On room air. Respiratory system: Bilateral decreased breath sounds at bases with some scattered crackles Cardiovascular system: S1 & S2 heard, Rate controlled Gastrointestinal system: Abdomen is obese, nondistended, soft and nontender. Normal bowel sounds heard. Extremities: No cyanosis, clubbing; trace lower extremity edema  Central nervous system: Alert and oriented.  Paraplegic.   Skin: No rashes, lesions or ulcers Psychiatry: Judgement and insight appear normal. Mood & affect appropriate.     Data Reviewed: I have personally reviewed following labs and imaging studies  CBC: Recent Labs  Lab 12/22/22 0902 12/23/22 0704  WBC 26.7* 23.0*  NEUTROABS 23.7* 19.6*  HGB 9.6* 8.0*  HCT 27.9* 24.0*  MCV 77.1* 78.9*  PLT 409* 337   Basic Metabolic Panel: Recent  Labs  Lab 12/22/22 0902 12/23/22 0704  NA 137 137  K 3.6 3.3*  CL 102 109  CO2 27 21*  GLUCOSE 125* 87  BUN 11 8  CREATININE 0.75 0.66  CALCIUM 9.2 7.9*  MG  --  1.6*  PHOS  --  2.4*   GFR: Estimated  Creatinine Clearance: 106.3 mL/min (by C-G formula based on SCr of 0.66 mg/dL). Liver Function Tests: Recent Labs  Lab 12/22/22 0902 12/23/22 0704  AST 38 37  ALT 32 30  ALKPHOS 108 84  BILITOT 1.1 0.8  PROT 7.2 5.6*  ALBUMIN 3.3* 2.1*   No results for input(s): "LIPASE", "AMYLASE" in the last 168 hours. No results for input(s): "AMMONIA" in the last 168 hours. Coagulation Profile: No results for input(s): "INR", "PROTIME" in the last 168 hours. Cardiac Enzymes: No results for input(s): "CKTOTAL", "CKMB", "CKMBINDEX", "TROPONINI" in the last 168 hours. BNP (last 3 results) No results for input(s): "PROBNP" in the last 8760 hours. HbA1C: No results for input(s): "HGBA1C" in the last 72 hours. CBG: Recent Labs  Lab 12/23/22 0746  GLUCAP 92   Lipid Profile: No results for input(s): "CHOL", "HDL", "LDLCALC", "TRIG", "CHOLHDL", "LDLDIRECT" in the last 72 hours. Thyroid Function Tests: No results for input(s): "TSH", "T4TOTAL", "FREET4", "T3FREE", "THYROIDAB" in the last 72 hours. Anemia Panel: Recent Labs    12/22/22 1122  FERRITIN 689*  TIBC 174*  IRON 18*   Sepsis Labs: No results for input(s): "PROCALCITON", "LATICACIDVEN" in the last 168 hours.  Recent Results (from the past 240 hour(s))  Culture, Blood     Status: None (Preliminary result)   Collection Time: 12/22/22 10:56 AM   Specimen: BLOOD LEFT ARM  Result Value Ref Range Status   Specimen Description BLOOD LEFT ARM  Final   Special Requests   Final    BOTTLES DRAWN AEROBIC AND ANAEROBIC Blood Culture adequate volume   Culture   Final    NO GROWTH < 24 HOURS Performed at [facility], [address] [place], [place] [address]    Report Status PENDING  Incomplete         Radiology Studies: No results found.      Scheduled Meds:  acyclovir  400 mg Oral BID   atorvastatin  20 mg Oral Daily   cholecalciferol  1,000 Units Oral Daily   dorzolamide-timolol  1 drop Both Eyes BID   iron  polysaccharides  150 mg Oral Daily   latanoprost  1 drop Both Eyes QHS   rivaroxaban  10 mg Oral Q supper   zinc oxide  1 Application Topical BID   Continuous Infusions:  cefTRIAXone (ROCEPHIN)  IV 2 g (12/22/22 1734)          Glade Lloyd, MD [facility] 12/23/2022, 10:56 AM

## 2022-12-23 NOTE — Plan of Care (Signed)
  Problem: Health Behavior/Discharge Planning: Goal: Ability to manage health-related needs will improve Outcome: Progressing   Problem: Clinical Measurements: Goal: Ability to maintain clinical measurements within normal limits will improve Outcome: Progressing   Problem: Clinical Measurements: Goal: Will remain free from infection Outcome: Progressing   

## 2022-12-24 ENCOUNTER — Inpatient Hospital Stay (HOSPITAL_COMMUNITY): Payer: Medicare (Managed Care)

## 2022-12-24 DIAGNOSIS — K592 Neurogenic bowel, not elsewhere classified: Secondary | ICD-10-CM

## 2022-12-24 DIAGNOSIS — N319 Neuromuscular dysfunction of bladder, unspecified: Secondary | ICD-10-CM | POA: Diagnosis not present

## 2022-12-24 DIAGNOSIS — G8220 Paraplegia, unspecified: Secondary | ICD-10-CM | POA: Diagnosis not present

## 2022-12-24 DIAGNOSIS — R6510 Systemic inflammatory response syndrome (SIRS) of non-infectious origin without acute organ dysfunction: Secondary | ICD-10-CM | POA: Diagnosis not present

## 2022-12-24 LAB — CBC WITH DIFFERENTIAL/PLATELET
Abs Immature Granulocytes: 0.2 10*3/uL — ABNORMAL HIGH (ref 0.00–0.07)
Basophils Absolute: 0.1 10*3/uL (ref 0.0–0.1)
Basophils Relative: 0 %
Eosinophils Absolute: 0.4 10*3/uL (ref 0.0–0.5)
Eosinophils Relative: 2 %
HCT: 24.3 % — ABNORMAL LOW (ref 39.0–52.0)
Hemoglobin: 8 g/dL — ABNORMAL LOW (ref 13.0–17.0)
Immature Granulocytes: 1 %
Lymphocytes Relative: 6 %
Lymphs Abs: 1.1 10*3/uL (ref 0.7–4.0)
MCH: 25.9 pg — ABNORMAL LOW (ref 26.0–34.0)
MCHC: 32.9 g/dL (ref 30.0–36.0)
MCV: 78.6 fL — ABNORMAL LOW (ref 80.0–100.0)
Monocytes Absolute: 1.5 10*3/uL — ABNORMAL HIGH (ref 0.1–1.0)
Monocytes Relative: 9 %
Neutro Abs: 14 10*3/uL — ABNORMAL HIGH (ref 1.7–7.7)
Neutrophils Relative %: 82 %
Platelets: 365 10*3/uL (ref 150–400)
RBC: 3.09 MIL/uL — ABNORMAL LOW (ref 4.22–5.81)
RDW: 19.5 % — ABNORMAL HIGH (ref 11.5–15.5)
WBC: 17.2 10*3/uL — ABNORMAL HIGH (ref 4.0–10.5)
nRBC: 0 % (ref 0.0–0.2)

## 2022-12-24 LAB — BASIC METABOLIC PANEL
Anion gap: 7 (ref 5–15)
BUN: 7 mg/dL — ABNORMAL LOW (ref 8–23)
CO2: 20 mmol/L — ABNORMAL LOW (ref 22–32)
Calcium: 8.1 mg/dL — ABNORMAL LOW (ref 8.9–10.3)
Chloride: 109 mmol/L (ref 98–111)
Creatinine, Ser: 0.68 mg/dL (ref 0.61–1.24)
GFR, Estimated: >60 mL/min (ref >60)
Glucose, Bld: 129 mg/dL — ABNORMAL HIGH (ref 70–99)
Potassium: 3.3 mmol/L — ABNORMAL LOW (ref 3.5–5.1)
Sodium: 136 mmol/L (ref 135–145)

## 2022-12-24 LAB — GLUCOSE, CAPILLARY
Glucose-Capillary: 113 mg/dL — ABNORMAL HIGH (ref 70–99)
Glucose-Capillary: 128 mg/dL — ABNORMAL HIGH (ref 70–99)
Glucose-Capillary: 133 mg/dL — ABNORMAL HIGH (ref 70–99)
Glucose-Capillary: 134 mg/dL — ABNORMAL HIGH (ref 70–99)

## 2022-12-24 LAB — URINE CULTURE

## 2022-12-24 LAB — MAGNESIUM: Magnesium: 1.8 mg/dL (ref 1.7–2.4)

## 2022-12-24 MED ORDER — POTASSIUM CHLORIDE CRYS ER 20 MEQ PO TBCR
40.0000 meq | EXTENDED_RELEASE_TABLET | Freq: Once | ORAL | Status: AC
  Administered 2022-12-24: 40 meq via ORAL
  Filled 2022-12-24: qty 2

## 2022-12-24 MED ORDER — VANCOMYCIN HCL 2000 MG/400ML IV SOLN
2000.0000 mg | Freq: Once | INTRAVENOUS | Status: AC
  Administered 2022-12-24: 2000 mg via INTRAVENOUS
  Filled 2022-12-24: qty 400

## 2022-12-24 MED ORDER — VANCOMYCIN HCL IN DEXTROSE 1-5 GM/200ML-% IV SOLN
1000.0000 mg | Freq: Two times a day (BID) | INTRAVENOUS | Status: DC
  Administered 2022-12-25: 1000 mg via INTRAVENOUS
  Filled 2022-12-24: qty 200

## 2022-12-24 MED ORDER — ADULT MULTIVITAMIN W/MINERALS CH
1.0000 | ORAL_TABLET | Freq: Every day | ORAL | Status: DC
  Administered 2022-12-24 – 2023-01-08 (×15): 1 via ORAL
  Filled 2022-12-24 (×16): qty 1

## 2022-12-24 MED ORDER — GLUCERNA SHAKE PO LIQD
237.0000 mL | Freq: Two times a day (BID) | ORAL | Status: DC
  Administered 2022-12-24 – 2023-01-08 (×24): 237 mL via ORAL
  Filled 2022-12-24 (×34): qty 237

## 2022-12-24 NOTE — Progress Notes (Signed)
Initial Nutrition Assessment  DOCUMENTATION CODES:   Obesity unspecified  INTERVENTION:  - Add Glucerna Shake po BID, each supplement provides 220 kcal and 10 grams of protein  - Add MVI q day.   NUTRITION DIAGNOSIS:   Inadequate oral intake related to poor appetite as evidenced by per patient/family report.  GOAL:   Patient will meet greater than or equal to 90% of their needs  MONITOR:   PO intake, Supplement acceptance  REASON FOR ASSESSMENT:   Consult Assessment of nutrition requirement/status, Diet education  ASSESSMENT:   67 y.o. male with PMH of adrenal insufficiency, anemia, cancer, T2DM , multiple myeloma, paraplegia, partial SBO. Pt with colostomy to LLQ. Pt is currently receiving medical management related to SIRS.  Meds reviewed: lipitor, Vit D3. Labs reviewed: K low.   Spoke with pt via phone. Pt reports that his appetite has been fair since admission. He states that he had a very poor appetite PTA for at least a week. Per record, pt has experienced some weight loss but not significant enough to qualify for malnutrition. RD will add Glucerna BID. Per record, pt has eaten 100% of his meals since admission. RD will continue to monitor PO intakes.   NUTRITION - FOCUSED PHYSICAL EXAM:  Remote assessment.  Diet Order:   Diet Order             Diet heart healthy/carb modified Room service appropriate? Yes; Fluid consistency: Thin  Diet effective now                   EDUCATION NEEDS:   Not appropriate for education at this time  Skin:  Skin Assessment: Reviewed RN Assessment  Last BM:  6/2  Height:   Ht Readings from Last 1 Encounters:  12/22/22 5\' 8"  (1.727 m)    Weight:   Wt Readings from Last 1 Encounters:  12/22/22 109.8 kg    Ideal Body Weight:     BMI:  Body mass index is 36.81 kg/m.  Estimated Nutritional Needs:   Kcal:  2200-2745 kcals  Protein:  110-135 gm  Fluid:  >/= 2.2 L  Bethann Humble, RD, LDN, CNSC.

## 2022-12-24 NOTE — Progress Notes (Signed)
PROGRESS NOTE    [name]  ZOX:[id number] DOB: 08-16-1955 DOA: 12/22/2022 PCP: Jethro Bastos, MD   Brief Narrative:   67 y.o. 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  ESR was 99.  Chest x-ray showed no acute cardiopulmonary process.  UA was suggestive of UTI.  He was started on IV fluids and antibiotics.  Assessment & Plan:   UTI: Present on admission, possibly associated with neurogenic bladder SIRS Leukocytosis -Currently on Rocephin.  Blood cultures negative so far.  Urine culture growing Proteus: Follow sensitivities. -WBCs improving to 17.2 today  Multiple myeloma in relapse -Currently undergoing chemotherapy as an outpatient as per oncology.  Outpatient follow-up with oncology  Iron deficiency anemia--continue oral iron supplementation.  Transfuse if hemoglobin is less than 7  Hypoalbuminemia -Follow nutrition recommendations.  History of DVT -Continue Xarelto  Diabetes mellitus type 2 -Blood sugars controlled.  Carb modified diet.  OSA -Declined to use CPAP at bedtime  Hypokalemia -Replace.  Repeat a.m. labs  Hypomagnesemia -Improved  Paraplegia with neurogenic bowel and bladder -Continue supportive care.  Fall precautions.  OT eval.  Thrombocytosis -Resolved  Obesity -Outpatient follow-up  DVT prophylaxis: Xarelto Code Status:  Full Family Communication: None at bedside Disposition Plan: Status is: Inpatient Remains inpatient appropriate because: Of severity of illness.  Need for IV antibiotics.  Possible discharge in 1 to 2 days if clinically improved.  Consultants: None  Procedures: None  Antimicrobials: Rocephin from 12/22/2022 onwards   Subjective: Patient seen and examined at bedside.  No fever, vomiting, chest pain or worsening shortness of breath reported.  Feels slightly better. Objective: Vitals:   12/23/22 0345 12/23/22 1404 12/23/22 2014 12/24/22 0402  BP: 118/65 112/62 97/61 115/62  Pulse: 97 91 88 88  Resp: 18 18 18 16   Temp: 99.3 F (37.4 C) (!) 97.5 F (36.4 C) 97.9 F (36.6 C) 98.1 F (36.7 C)  TempSrc: Oral Oral Oral Oral  SpO2: 94% 100% 99% 97%  Weight:      Height:        Intake/Output Summary (Last 24 hours) at 12/24/2022 0747 Last data filed at 12/24/2022 0400 Gross per 24 hour  Intake 625 ml  Output 2400 ml  Net -1775 ml    Filed Weights   12/22/22 1500  Weight: 109.8 kg    Examination:  General: Currently on room air.  No distress ENT/neck: No thyromegaly.  JVD is not elevated  respiratory: Decreased breath sounds at bases bilaterally with some crackles; no wheezing  CVS: S1-S2 heard, rate controlled currently Abdominal: Soft, obese, nontender, slightly distended; no organomegaly, bowel sounds are heard Extremities: Trace lower extremity edema; no cyanosis  CNS: Awake and alert.  Paraplegia.   Lymph: No obvious lymphadenopathy Skin: No obvious ecchymosis/lesions  psych: Affect, judgment and mood are normal  musculoskeletal: No obvious joint swelling/deformity     Data Reviewed: I have personally reviewed following labs and imaging studies  CBC: Recent Labs  Lab  12/22/22 0902 12/23/22 0704 12/24/22 0530  WBC 26.7* 23.0* 17.2*  NEUTROABS 23.7* 19.6* 14.0*  HGB 9.6* 8.0* 8.0*  HCT 27.9* 24.0* 24.3*  MCV 77.1* 78.9* 78.6*  PLT 409* 337 365    Basic  Metabolic Panel: Recent Labs  Lab 12/22/22 0902 12/23/22 0704 12/24/22 0530  NA 137 137 136  K 3.6 3.3* 3.3*  CL 102 109 109  CO2 27 21* 20*  GLUCOSE 125* 87 129*  BUN 11 8 7*  CREATININE 0.75 0.66 0.68  CALCIUM 9.2 7.9* 8.1*  MG  --  1.6* 1.8  PHOS  --  2.4*  --     GFR: Estimated Creatinine Clearance: 106.3 mL/min (by C-G formula based on SCr of 0.68 mg/dL). Liver Function Tests: Recent Labs  Lab 12/22/22 0902 12/23/22 0704  AST 38 37  ALT 32 30  ALKPHOS 108 84  BILITOT 1.1 0.8  PROT 7.2 5.6*  ALBUMIN 3.3* 2.1*    No results for input(s): "LIPASE", "AMYLASE" in the last 168 hours. No results for input(s): "AMMONIA" in the last 168 hours. Coagulation Profile: No results for input(s): "INR", "PROTIME" in the last 168 hours. Cardiac Enzymes: No results for input(s): "CKTOTAL", "CKMB", "CKMBINDEX", "TROPONINI" in the last 168 hours. BNP (last 3 results) No results for input(s): "PROBNP" in the last 8760 hours. HbA1C: No results for input(s): "HGBA1C" in the last 72 hours. CBG: Recent Labs  Lab 12/23/22 0746 12/23/22 1152 12/23/22 1641 12/23/22 2203 12/24/22 0723  GLUCAP 92 99 115* 129* 113*    Lipid Profile: No results for input(s): "CHOL", "HDL", "LDLCALC", "TRIG", "CHOLHDL", "LDLDIRECT" in the last 72 hours. Thyroid Function Tests: No results for input(s): "TSH", "T4TOTAL", "FREET4", "T3FREE", "THYROIDAB" in the last 72 hours. Anemia Panel: Recent Labs    12/22/22 1122  FERRITIN 689*  TIBC 174*  IRON 18*    Sepsis Labs: No results for input(s): "PROCALCITON", "LATICACIDVEN" in the last 168 hours.  Recent Results (from the past 240 hour(s))  Culture, Blood     Status: None (Preliminary result)   Collection Time: 12/22/22 10:56 AM   Specimen: BLOOD LEFT ARM  Result Value Ref Range Status   Specimen Description BLOOD LEFT ARM  Final   Special Requests   Final    BOTTLES DRAWN AEROBIC AND ANAEROBIC Blood Culture adequate volume   Culture    Final    NO GROWTH < 24 HOURS Performed at [facility], [address] [place], [place] [address]    Report Status PENDING  Incomplete  Urine Culture     Status: Abnormal (Preliminary result)   Collection Time: 12/22/22  2:40 PM   Specimen: Urine, Clean Catch  Result Value Ref Range Status   Specimen Description   Final    URINE, CLEAN CATCH Performed at [facility], [address] [place], [place] [address]    Special Requests   Final    NONE Performed at [facility], [address], [place], [place] [address]    Culture (A)  Final    >=100,000 COLONIES/mL PROTEUS MIRABILIS SUSCEPTIBILITIES TO FOLLOW Performed at [facility], [address] [place], [place] [address]    Report Status PENDING  Incomplete         Radiology Studies: No results found.      Scheduled Meds:  acyclovir  400 mg Oral BID   atorvastatin  20 mg Oral Daily   cholecalciferol  1,000 Units Oral Daily  dorzolamide-timolol  1 drop Both Eyes BID   iron polysaccharides  150 mg Oral Daily   latanoprost  1 drop Both Eyes QHS   potassium chloride  40 mEq Oral Once   rivaroxaban  10 mg Oral Q supper   zinc oxide  1 Application Topical BID   Continuous Infusions:  cefTRIAXone (ROCEPHIN)  IV 2 g (12/23/22 1810)          Glade Lloyd, MD [facility] 12/24/2022, 7:47 AM

## 2022-12-24 NOTE — Evaluation (Signed)
Occupational Therapy Evaluation and Discharge Patient Details Name: [name] MRN: [id number] DOB: 14-Nov-1955 Today's Date: 12/24/2022   History of Present Illness Pt is a 67 year old man admitted from the cancer center with fever tachycardia, mild hypotension, leukocytosis and poor oral intake. + SIRS. PMH: paraplegia, colostomy, neurogenic bowel and bladder, multiple myeloma under current treatment, recent COVID 19, adrenal insufficiency, anemia, DVT with IVC placement, GIB.   Clinical Impression   Pt lives with his wife and 67 year old child. He has a Engineer, agricultural who comes to his home or he goes to [facility] where he receives assist with ADLs, medical care and participates in exercises and activities. Pt has all necessary DME at home and is assisted for bathing, dressing, foley changes/care and IADLs. He transfers with a hoyer lift and is assisted for bathing and dressing. Pt can self feed, groom, participate in UB bathing and dressing and change his colostomy bag. Pt is at his baseline with no further OT needs. Recommend OOB to his w/c with maximove with nursing staff.      Recommendations for follow up therapy are one component of a multi-disciplinary discharge planning process, led by the attending physician.  Recommendations may be updated based on patient status, additional functional criteria and insurance authorization.   Assistance Recommended at Discharge Intermittent Supervision/Assistance  Patient can return home with the following Assist for transportation;Assistance with cooking/housework;A lot of help with bathing/dressing/bathroom;A lot of help with walking and/or transfers    Functional Status Assessment  Patient has not had a recent decline in their functional status  Equipment Recommendations  None recommended by OT    Recommendations for Other Services       Precautions / Restrictions Precautions Precautions: Fall Precaution Comments: colostomy,  foley      Mobility Bed Mobility               General bed mobility comments: rolls himself with assist of rails    Transfers                   General transfer comment: total assist with hoyer lift      Balance                                           ADL either performed or assessed with clinical judgement   ADL Overall ADL's : At baseline                                             Vision Baseline Vision/History: 0 No visual deficits       Perception     Praxis      Pertinent Vitals/Pain Pain Assessment Pain Assessment: No/denies pain     Hand Dominance Right   Extremity/Trunk Assessment Upper Extremity Assessment Upper Extremity Assessment: Overall WFL for tasks assessed   Lower Extremity Assessment Lower Extremity Assessment:  (paraplegia)   Cervical / Trunk Assessment Cervical / Trunk Assessment: Other exceptions (weakness)   Communication Communication Communication: No difficulties   Cognition Arousal/Alertness: Awake/alert Behavior During Therapy: WFL for tasks assessed/performed Overall Cognitive Status: Within Functional Limits for tasks assessed  General Comments       Exercises     Shoulder Instructions      Home Living Family/patient expects to be discharged to:: Private residence Living Arrangements: Spouse/significant other;Children Available Help at Discharge: Family;Available PRN/intermittently;Personal care attendant Type of Home: House Home Access: Ramped entrance     Home Layout: One level               Home Equipment: Wheelchair - manual;Hospital bed;Other (comment) (hoyer lift)          Prior Functioning/Environment Prior Level of Function : Needs assist             Mobility Comments: hoyer lift to bed/w/c, propels his manual w/c ADLs Comments: pt completes his own colostomy changes, RN changes  foley weekly, assist of personal care attendant for sponge bathing, dressing, gets in shower at [facility]        OT Problem List:        OT Treatment/Interventions:      OT Goals(Current goals can be found in the care plan section)    OT Frequency:      Co-evaluation              AM-PAC OT "6 Clicks" Daily Activity     Outcome Measure Help from another person eating meals?: None Help from another person taking care of personal grooming?: None Help from another person toileting, which includes using toliet, bedpan, or urinal?: Total Help from another person bathing (including washing, rinsing, drying)?: A Lot Help from another person to put on and taking off regular upper body clothing?: A Little Help from another person to put on and taking off regular lower body clothing?: Total 6 Click Score: 15   End of Session    Activity Tolerance: Patient tolerated treatment well Patient left: in bed;with call bell/phone within reach  OT Visit Diagnosis: Other (comment) (paraplegia)                Time: 9147-8295 OT Time Calculation (min): 17 min Charges:  OT General Charges $OT Visit: 1 Visit OT Evaluation $OT Eval Moderate Complexity: 1 Mod  Berna Spare, OTR/L Acute Rehabilitation Services Office: [phone]   Evern Bio 12/24/2022, 9:14 AM

## 2022-12-24 NOTE — Progress Notes (Signed)
Pharmacy Antibiotic Note  [name] is a 67 y.o. male admitted on 12/22/2022 with UTI.  Initially treated with Cefepime which was changed to Ceftriaxone on 5/31  Today Pharmacy has been consulted for Vancomycin dosing for scrotal abscess.  Plan: Vancomycin 2gm IV x 1 followed by Vancomycin 1000 mg IV Q 12 hrs. Goal AUC 400-550.  Expected AUC: 483.4  SCr used: 0.8 (rounded up from 0.68) Continue Ceftriaxone per MD Follow final culture results and sensitivities Follow renal function   Height: 5\' 8"  (172.7 cm) Weight: 109.8 kg (242 lb 1 oz) IBW/kg (Calculated) : 68.4  Temp (24hrs), Avg:97.8 F (36.6 C), Min:97.5 F (36.4 C), Max:98.1 F (36.7 C)  Recent Labs  Lab 12/22/22 0902 12/23/22 0704 12/24/22 0530  WBC 26.7* 23.0* 17.2*  CREATININE 0.75 0.66 0.68    Estimated Creatinine Clearance: 106.3 mL/min (by C-G formula based on SCr of 0.68 mg/dL).    Allergies  Allergen Reactions   Ferumoxytol Nausea And Vomiting and Other (See Comments)    The patient felt like he was going to pass out and had chills also     Antimicrobials this admission: 5/31 Cefepime >> 5/31 5/31 Ceftriaxone >>   6/2 Vancomycin >>  Dose adjustments this admission:    Microbiology results: 5/31 BCx:  ngtd 5/31 UCx: proteus     Thank you for allowing pharmacy to be a part of this patient's care.  Maryellen Pile, PharmD 12/24/2022 1:45 PM

## 2022-12-24 NOTE — Progress Notes (Signed)
While cleaning patient up, I notice his scrotum was red and swollen. As I cleaned I also noticed a large amount of pus oozing from his scrotum( foul smelling). Dr. Hanley Ben was notified and he started [facility] on Vancomycin IV.Ultrasound was also done to his scrotum. Foam dressing was started on his sacrum for protection. Patient was turned and repositioned q 2 hour.  Family in to visit.

## 2022-12-25 ENCOUNTER — Other Ambulatory Visit: Payer: Self-pay | Admitting: Urology

## 2022-12-25 ENCOUNTER — Inpatient Hospital Stay (HOSPITAL_COMMUNITY): Payer: Medicare (Managed Care) | Admitting: Anesthesiology

## 2022-12-25 ENCOUNTER — Encounter (HOSPITAL_COMMUNITY): Payer: Self-pay

## 2022-12-25 ENCOUNTER — Encounter (HOSPITAL_COMMUNITY)
Admission: RE | Disposition: A | Discharge status: Home Health | Payer: Self-pay | Source: Ambulatory Visit | Attending: Internal Medicine

## 2022-12-25 DIAGNOSIS — I82409 Acute embolism and thrombosis of unspecified deep veins of unspecified lower extremity: Secondary | ICD-10-CM | POA: Diagnosis not present

## 2022-12-25 DIAGNOSIS — R6510 Systemic inflammatory response syndrome (SIRS) of non-infectious origin without acute organ dysfunction: Secondary | ICD-10-CM | POA: Diagnosis not present

## 2022-12-25 DIAGNOSIS — N493 Fournier gangrene: Secondary | ICD-10-CM

## 2022-12-25 DIAGNOSIS — G4730 Sleep apnea, unspecified: Secondary | ICD-10-CM

## 2022-12-25 DIAGNOSIS — E785 Hyperlipidemia, unspecified: Secondary | ICD-10-CM | POA: Diagnosis not present

## 2022-12-25 HISTORY — PX: INCISION AND DRAINAGE ABSCESS: SHX5864

## 2022-12-25 LAB — GLUCOSE, CAPILLARY
Glucose-Capillary: 107 mg/dL — ABNORMAL HIGH (ref 70–99)
Glucose-Capillary: 97 mg/dL (ref 70–99)
Glucose-Capillary: 66 mg/dL — ABNORMAL LOW (ref 70–99)
Glucose-Capillary: 104 mg/dL — ABNORMAL HIGH (ref 70–99)
Glucose-Capillary: 107 mg/dL — ABNORMAL HIGH (ref 70–99)
Glucose-Capillary: 107 mg/dL — ABNORMAL HIGH (ref 70–99)
Glucose-Capillary: 230 mg/dL — ABNORMAL HIGH (ref 70–99)

## 2022-12-25 LAB — BASIC METABOLIC PANEL
Anion gap: 4 — ABNORMAL LOW (ref 5–15)
BUN: 6 mg/dL — ABNORMAL LOW (ref 8–23)
CO2: 23 mmol/L (ref 22–32)
Calcium: 8.7 mg/dL — ABNORMAL LOW (ref 8.9–10.3)
Chloride: 110 mmol/L (ref 98–111)
Creatinine, Ser: 0.69 mg/dL (ref 0.61–1.24)
GFR, Estimated: >60 mL/min (ref >60)
Glucose, Bld: 131 mg/dL — ABNORMAL HIGH (ref 70–99)
Potassium: 3.6 mmol/L (ref 3.5–5.1)
Sodium: 137 mmol/L (ref 135–145)

## 2022-12-25 LAB — MAGNESIUM: Magnesium: 1.7 mg/dL (ref 1.7–2.4)

## 2022-12-25 LAB — CBC WITH DIFFERENTIAL/PLATELET
Abs Immature Granulocytes: 0.21 10*3/uL — ABNORMAL HIGH (ref 0.00–0.07)
Basophils Absolute: 0.1 10*3/uL (ref 0.0–0.1)
Basophils Relative: 1 %
Eosinophils Absolute: 0.4 10*3/uL (ref 0.0–0.5)
Eosinophils Relative: 4 %
HCT: 24.5 % — ABNORMAL LOW (ref 39.0–52.0)
Hemoglobin: 8 g/dL — ABNORMAL LOW (ref 13.0–17.0)
Immature Granulocytes: 2 %
Lymphocytes Relative: 10 %
Lymphs Abs: 1.2 10*3/uL (ref 0.7–4.0)
MCH: 25.4 pg — ABNORMAL LOW (ref 26.0–34.0)
MCHC: 32.7 g/dL (ref 30.0–36.0)
MCV: 77.8 fL — ABNORMAL LOW (ref 80.0–100.0)
Monocytes Absolute: 1.2 10*3/uL — ABNORMAL HIGH (ref 0.1–1.0)
Monocytes Relative: 10 %
Neutro Abs: 8.1 10*3/uL — ABNORMAL HIGH (ref 1.7–7.7)
Neutrophils Relative %: 73 %
Platelets: 376 10*3/uL (ref 150–400)
RBC: 3.15 MIL/uL — ABNORMAL LOW (ref 4.22–5.81)
RDW: 19.1 % — ABNORMAL HIGH (ref 11.5–15.5)
WBC: 11 10*3/uL — ABNORMAL HIGH (ref 4.0–10.5)
nRBC: 0 % (ref 0.0–0.2)

## 2022-12-25 LAB — HEMOGLOBIN A1C
Hgb A1c MFr Bld: 5.4 % (ref 4.8–5.6)
Mean Plasma Glucose: 108 mg/dL

## 2022-12-25 LAB — CULTURE, BLOOD (SINGLE)

## 2022-12-25 SURGERY — INCISION AND DRAINAGE, ABSCESS
Anesthesia: General | Site: Scrotum

## 2022-12-25 MED ORDER — FENTANYL CITRATE (PF) 100 MCG/2ML IJ SOLN
INTRAMUSCULAR | Status: AC
  Filled 2022-12-25: qty 2

## 2022-12-25 MED ORDER — TRANEXAMIC ACID 650 MG PO TABS: 1300.0000 mg | ORAL_TABLET | Freq: Once | ORAL | Status: DC

## 2022-12-25 MED ORDER — PROPOFOL 10 MG/ML IV BOLUS
INTRAVENOUS | Status: AC
  Filled 2022-12-25: qty 20

## 2022-12-25 MED ORDER — LIDOCAINE HCL (PF) 2 % IJ SOLN
INTRAMUSCULAR | Status: AC
  Filled 2022-12-25: qty 5

## 2022-12-25 MED ORDER — 0.9 % SODIUM CHLORIDE (POUR BTL) OPTIME
TOPICAL | Status: DC | PRN
  Administered 2022-12-25: 1000 mL

## 2022-12-25 MED ORDER — AMISULPRIDE (ANTIEMETIC) 5 MG/2ML IV SOLN: 10.0000 mg | Freq: Once | INTRAVENOUS | Status: DC | PRN

## 2022-12-25 MED ORDER — PROPOFOL 10 MG/ML IV BOLUS
INTRAVENOUS | Status: DC | PRN
  Administered 2022-12-25: 200 mg via INTRAVENOUS

## 2022-12-25 MED ORDER — SUGAMMADEX SODIUM 200 MG/2ML IV SOLN
INTRAVENOUS | Status: DC | PRN
  Administered 2022-12-25: 200 mg via INTRAVENOUS

## 2022-12-25 MED ORDER — DEXAMETHASONE SODIUM PHOSPHATE 10 MG/ML IJ SOLN
INTRAMUSCULAR | Status: AC
  Filled 2022-12-25: qty 1

## 2022-12-25 MED ORDER — PHENYLEPHRINE 80 MCG/ML (10ML) SYRINGE FOR IV PUSH (FOR BLOOD PRESSURE SUPPORT)
PREFILLED_SYRINGE | INTRAVENOUS | Status: DC | PRN
  Administered 2022-12-25 (×2): 80 ug via INTRAVENOUS

## 2022-12-25 MED ORDER — PIPERACILLIN-TAZOBACTAM 3.375 G IVPB
3.3750 g | Freq: Three times a day (TID) | INTRAVENOUS | Status: DC
  Administered 2022-12-25 – 2022-12-28 (×9): 3.375 g via INTRAVENOUS
  Filled 2022-12-25 (×8): qty 50

## 2022-12-25 MED ORDER — LIDOCAINE 2% (20 MG/ML) 5 ML SYRINGE
INTRAMUSCULAR | Status: DC | PRN
  Administered 2022-12-25: 100 mg via INTRAVENOUS

## 2022-12-25 MED ORDER — LACTATED RINGERS IV SOLN: INTRAVENOUS | Status: DC

## 2022-12-25 MED ORDER — OXYCODONE HCL 5 MG/5ML PO SOLN: 5.0000 mg | Freq: Once | ORAL | Status: DC | PRN

## 2022-12-25 MED ORDER — PHENYLEPHRINE HCL-NACL 20-0.9 MG/250ML-% IV SOLN
INTRAVENOUS | Status: DC | PRN
  Administered 2022-12-25: 35 ug/min via INTRAVENOUS

## 2022-12-25 MED ORDER — OXYCODONE HCL 5 MG PO TABS: 5.0000 mg | ORAL_TABLET | Freq: Once | ORAL | Status: DC | PRN

## 2022-12-25 MED ORDER — CHLORHEXIDINE GLUCONATE 0.12 % MT SOLN
15.0000 mL | Freq: Once | OROMUCOSAL | Status: AC
  Administered 2022-12-25: 15 mL via OROMUCOSAL

## 2022-12-25 MED ORDER — FENTANYL CITRATE (PF) 100 MCG/2ML IJ SOLN
INTRAMUSCULAR | Status: DC | PRN
  Administered 2022-12-25: 50 ug via INTRAVENOUS

## 2022-12-25 MED ORDER — DEXAMETHASONE SODIUM PHOSPHATE 10 MG/ML IJ SOLN
INTRAMUSCULAR | Status: DC | PRN
  Administered 2022-12-25: 10 mg via INTRAVENOUS

## 2022-12-25 MED ORDER — ONDANSETRON HCL 4 MG/2ML IJ SOLN
INTRAMUSCULAR | Status: DC | PRN
  Administered 2022-12-25: 4 mg via INTRAVENOUS

## 2022-12-25 MED ORDER — ONDANSETRON HCL 4 MG/2ML IJ SOLN
INTRAMUSCULAR | Status: AC
  Filled 2022-12-25: qty 2

## 2022-12-25 MED ORDER — ROCURONIUM BROMIDE 10 MG/ML (PF) SYRINGE
PREFILLED_SYRINGE | INTRAVENOUS | Status: DC | PRN
  Administered 2022-12-25: 60 mg via INTRAVENOUS
  Administered 2022-12-25: 20 mg via INTRAVENOUS

## 2022-12-25 MED ORDER — FENTANYL CITRATE PF 50 MCG/ML IJ SOSY: 25.0000 ug | PREFILLED_SYRINGE | INTRAMUSCULAR | Status: DC | PRN

## 2022-12-25 MED ORDER — MIDAZOLAM HCL 2 MG/2ML IJ SOLN
INTRAMUSCULAR | Status: AC
  Filled 2022-12-25: qty 2

## 2022-12-25 MED ORDER — LINEZOLID 600 MG/300ML IV SOLN
600.0000 mg | Freq: Two times a day (BID) | INTRAVENOUS | Status: DC
  Administered 2022-12-26 – 2022-12-28 (×5): 600 mg via INTRAVENOUS
  Filled 2022-12-25 (×7): qty 300

## 2022-12-25 MED ORDER — MIDAZOLAM HCL 5 MG/5ML IJ SOLN
INTRAMUSCULAR | Status: DC | PRN
  Administered 2022-12-25: 2 mg via INTRAVENOUS

## 2022-12-25 SURGICAL SUPPLY — 43 items
ADH SKN CLS APL DERMABOND .7 (GAUZE/BANDAGES/DRESSINGS)
APL PRP STRL LF ISPRP CHG 10.5 (MISCELLANEOUS) ×2
APPLICATOR CHLORAPREP 10.5 ORG (MISCELLANEOUS) ×3 IMPLANT
BAG COUNTER SPONGE SURGICOUNT (BAG) IMPLANT
BAG SPNG CNTER NS LX DISP (BAG)
BNDG GAUZE DERMACEA FLUFF 4 (GAUZE/BANDAGES/DRESSINGS) ×3 IMPLANT
BNDG GZE DERMACEA 4 6PLY (GAUZE/BANDAGES/DRESSINGS) ×2
BRIEF MESH DISP LRG (UNDERPADS AND DIAPERS) IMPLANT
COVER SURGICAL LIGHT HANDLE (MISCELLANEOUS) ×3 IMPLANT
DERMABOND ADVANCED .7 DNX12 (GAUZE/BANDAGES/DRESSINGS) IMPLANT
DRAPE LAPAROTOMY T 98X78 PEDS (DRAPES) ×3 IMPLANT
ELECT REM PT RETURN 15FT ADLT (MISCELLANEOUS) ×3 IMPLANT
GAUZE PAD ABD 8X10 STRL (GAUZE/BANDAGES/DRESSINGS) ×2 IMPLANT
GLOVE SS BIOGEL STRL SZ 7 (GLOVE) ×3 IMPLANT
GOWN STRL REUS W/ TWL XL LVL3 (GOWN DISPOSABLE) ×3 IMPLANT
GOWN STRL REUS W/TWL XL LVL3 (GOWN DISPOSABLE) ×2
KIT BASIN OR (CUSTOM PROCEDURE TRAY) ×3 IMPLANT
KIT TURNOVER KIT A (KITS) IMPLANT
NDL HYPO 22X1.5 SAFETY MO (MISCELLANEOUS) IMPLANT
NEEDLE HYPO 22X1.5 SAFETY MO (MISCELLANEOUS) IMPLANT
NS IRRIG 1000ML POUR BTL (IV SOLUTION) ×3 IMPLANT
PACK GENERAL/GYN (CUSTOM PROCEDURE TRAY) ×3 IMPLANT
SHEET LAVH (DRAPES) ×1 IMPLANT
SPIKE FLUID TRANSFER (MISCELLANEOUS) ×3 IMPLANT
SPONGE T-LAP 18X18 ~~LOC~~+RFID (SPONGE) ×3 IMPLANT
SUPPORT SCROTAL LG STRP (MISCELLANEOUS) ×3 IMPLANT
SUT CHROMIC 3 0 SH 27 (SUTURE) IMPLANT
SUT CHROMIC 4 0 SH 27 (SUTURE) ×2 IMPLANT
SUT MNCRL AB 3-0 PS2 18 (SUTURE) IMPLANT
SUT MNCRL AB 4-0 PS2 18 (SUTURE) IMPLANT
SUT PDS AB 3-0 SH 27 (SUTURE) IMPLANT
SUT PDS AB 4-0 SH 27 (SUTURE) IMPLANT
SUT SILK 3 0 (SUTURE)
SUT SILK 3 0 SH 30 (SUTURE) IMPLANT
SUT SILK 3-0 18XBRD TIE 12 (SUTURE) IMPLANT
SUT SILK 4-0 12X30IN (SUTURE) IMPLANT
SUT VIC AB 2-0 SH 27 (SUTURE)
SUT VIC AB 2-0 SH 27XBRD (SUTURE) ×2 IMPLANT
SUT VIC AB 3-0 SH 27 (SUTURE) ×2
SUT VIC AB 3-0 SH 27X BRD (SUTURE) ×1 IMPLANT
SYR CONTROL 10ML LL (SYRINGE) IMPLANT
TOWEL OR 17X26 10 PK STRL BLUE (TOWEL DISPOSABLE) ×3 IMPLANT
TOWEL OR NON WOVEN STRL DISP B (DISPOSABLE) ×3 IMPLANT

## 2022-12-25 NOTE — Anesthesia Preprocedure Evaluation (Addendum)
Anesthesia Evaluation  Patient identified by MRN, date of birth, ID band Patient awake    Reviewed: Allergy & Precautions, NPO status , Patient's Chart, lab work & pertinent test results  History of Anesthesia Complications Negative for: history of anesthetic complications  Airway Mallampati: III  TM Distance: >3 FB Neck ROM: Full    Dental  (+) Dental Advisory Given, Poor Dentition   Pulmonary neg shortness of breath, sleep apnea (does not use CPAP) , neg COPD, Recent URI  (COVID at the beginning of May, has some white phlegm)   Pulmonary exam normal breath sounds clear to auscultation       Cardiovascular (-) hypertension(-) angina + DVT (IVC filter, Xarelto)  (-) Past MI, (-) Cardiac Stents and (-) CABG (-) dysrhythmias  Rhythm:Regular Rate:Normal  HLD   Neuro/Psych neg Seizures  Neuromuscular disease (paraplegia s/p resection of multiple myeloma, neurogenic bladder)    GI/Hepatic negative GI ROS, Neg liver ROS,,,  Endo/Other  diabetes, Type 2  Adrenal insufficiency  Renal/GU negative Renal ROS     Musculoskeletal   Abdominal  (+) + obese  Peds  Hematology  (+) Blood dyscrasia (Hgb 8.0), anemia Multiple myeloma   Anesthesia Other Findings 67 year old man directed to [facility] from the cancer center after presenting with low-grade fever, tachycardia, mild hypotension, and leukocytosis with WBC of 26.7.  He had been on oral Levaquin for 3 days at the time.  PMH significant for adrenal insufficiency with chronic steroid use, IDA, Hx of DVT on Xarelto, IVC placement, paraplegia, neurogenic bowel and bladder, and multiple myeloma.  Last Xarelto:  Osteonecrosis of right jaw  Reproductive/Obstetrics                             Anesthesia Physical Anesthesia Plan  ASA: 3  Anesthesia Plan: General   Post-op Pain Management:    Induction: Intravenous  PONV Risk Score and Plan: 2  and Ondansetron, Dexamethasone and Treatment may vary due to age or medical condition  Airway Management Planned: Oral ETT  Additional Equipment:   Intra-op Plan:   Post-operative Plan: Extubation in OR  Informed Consent:      Dental advisory given  Plan Discussed with: CRNA and Anesthesiologist  Anesthesia Plan Comments: (Risks of general anesthesia discussed including, but not limited to, sore throat, hoarse voice, chipped/damaged teeth, injury to vocal cords, nausea and vomiting, allergic reactions, lung infection, heart attack, stroke, and death. All questions answered. )       Anesthesia Quick Evaluation

## 2022-12-25 NOTE — Anesthesia Postprocedure Evaluation (Signed)
Anesthesia Post Note  Patient: [name]  Procedure(s) Performed: INCISION AND DRAINAGE OF SCROTUM (Scrotum)     Patient location during evaluation: PACU Anesthesia Type: General Level of consciousness: awake Pain management: pain level controlled Vital Signs Assessment: post-procedure vital signs reviewed and stable Respiratory status: spontaneous breathing, nonlabored ventilation and respiratory function stable Cardiovascular status: blood pressure returned to baseline and stable Postop Assessment: no apparent nausea or vomiting Anesthetic complications: no  No notable events documented.  Last Vitals:  Vitals:   12/25/22 1530 12/25/22 1545  BP: 100/74 98/70  Pulse: (!) 106 100  Resp: 15 19  Temp:  (!) 36.4 C  SpO2: 100% 100%    Last Pain:  Vitals:   12/25/22 1530  TempSrc:   PainSc: 0-No pain                 Linton Rump

## 2022-12-25 NOTE — Progress Notes (Signed)
Teeth brushed, CHG bath done. Urine bag emptied. [name] called his wife to tell her he's going to surgery. Patient taken to the OR via bed.

## 2022-12-25 NOTE — Consult Note (Addendum)
Urology Consult  Referring physician: Dr. Hanley Ben Reason for referral: Scrotal abscess  Chief Complaint: As above  History of Present Illness: [name] is a 67 year old man directed to [facility] hospital from the cancer center after presenting with low-grade fever, tachycardia, mild hypotension, and leukocytosis with WBC of 26.7.  He had been on oral Levaquin for 3 days at the time.  PMH significant for adrenal insufficiency with chronic steroid use, IDA, Hx of DVT on Xarelto, IVC placement, paraplegia, neurogenic bowel and bladder, and multiple myeloma.  [facility] was consulted to speak to abnormal scrotal ultrasound and weeping scrotal abscess.  Past Medical History:  Diagnosis Date   Adrenal insufficiency (HCC)    on chronic dexamethasone   Anemia    Cancer (HCC)    Coagulopathy (HCC)    on xeralto/ s/p DVT while on coumadin,  IVC in place   Diabetes mellitus without complication (HCC)    type 2   Glaucoma 12/22/2022   Gross hematuria 01/2013   post foley cath procedure   History of blood transfusion 01/2013   Lower GI bleeding 04/18/2017   Multiple myeloma    thoracic T8 with paraplegia s/p resection- on chemo at visit 10/13/10   Multiple myeloma    Multiple myeloma without mention of remission    Neurogenic bladder    Neurogenic bowel    OSA (obstructive sleep apnea) 11/01/2022   Paraplegia (HCC)    Partial small bowel obstruction (HCC) during dec 2011 admission   Past Surgical History:  Procedure Laterality Date   COLONOSCOPY WITH PROPOFOL N/A 04/12/2017   Procedure: COLONOSCOPY WITH PROPOFOL;  Surgeon: Hilarie Fredrickson, MD;  Location: [facility];  Service: Endoscopy;  Laterality: N/A;   COLONOSCOPY WITH PROPOFOL N/A 04/19/2017   Procedure: COLONOSCOPY WITH PROPOFOL;  Surgeon: Benancio Deeds, MD;  Location: [facility];  Service: Gastroenterology;  Laterality: N/A;   COLOSTOMY  07/20/2011   Procedure: COLOSTOMY;  Surgeon: Rulon Abide, DO;   [facility] OR;  Service: General;;   COLOSTOMY REVISION  07/20/2011   Procedure: COLON RESECTION SIGMOID;  Surgeon: Rulon Abide, DO;  Location: [facility] OR;  Service: General;;   CYSTOSCOPY N/A 04/04/2013   Procedure: CYSTOSCOPY WITH LITHALOPAXY;  Surgeon: Sebastian Ache, MD;  Location: [facility];  Service: Urology;  Laterality: N/A;   INSERTION OF SUPRAPUBIC CATHETER N/A 04/04/2013   Procedure: INSERTION OF SUPRAPUBIC CATHETER;  Surgeon: Sebastian Ache, MD;  Location: [facility];  Service: Urology;  Laterality: N/A;   LAPAROTOMY  07/20/2011   Procedure: EXPLORATORY LAPAROTOMY;  Surgeon: Rulon Abide, DO;  Location: [facility] OR;  Service: General;  Laterality: N/A;   myeloma thoracic T8 with parpaplegia s/p thoracotomy and thoracic T7-9 cage placement on Dec 26th 2011  07/18/10    Medications: I have reviewed the patient's current medications. Allergies:  Allergies  Allergen Reactions   Ferumoxytol Nausea And Vomiting and Other (See Comments)    The patient felt like he was going to pass out and had chills also     Family History  Problem Relation Age of Onset   Ovarian cancer Mother    Diabetes Father    Social History:  reports that he has never smoked. He has never used smokeless tobacco. He reports that he does not drink alcohol and does not use drugs.  ROS: All systems are reviewed and negative except as noted. Noticed weeping from groin.  Unable to feel from the waist down.  Physical Exam:  Vital signs  in last 24 hours: Temp:  [97.6 F (36.4 C)-98.1 F (36.7 C)] 97.6 F (36.4 C) (06/03 0981) Pulse Rate:  [75-83] 83 (06/03 0608) Resp:  [18-20] 18 (06/02 2032) BP: (109-130)/(66-81) 117/66 (06/03 1914) SpO2:  [96 %-98 %] 96 % (06/03 7829)  Cardiovascular: Skin warm; not flushed Respiratory: Breaths quiet; no shortness of breath Abdomen: No masses, soft, no guarding or rebound Neurological: Normal sensation to touch Musculoskeletal: Normal motor function arms and legs Skin:  No rashes Genitourinary: SPT in place draining clear yellow urine.  Right posterior scrotal abscess.  Laboratory Data:  Results for orders placed or performed during the hospital encounter of 12/22/22 (from the past 72 hour(s))  CBC with Differential     Status: Abnormal   Collection Time: 12/23/22  7:04 AM  Result Value Ref Range   WBC 23.0 (H) 4.0 - 10.5 K/uL   RBC 3.04 (L) 4.22 - 5.81 MIL/uL   Hemoglobin 8.0 (L) 13.0 - 17.0 g/dL   HCT 56.2 (L) 13.0 - 86.5 %   MCV 78.9 (L) 80.0 - 100.0 fL   MCH 26.3 26.0 - 34.0 pg   MCHC 33.3 30.0 - 36.0 g/dL   RDW 78.4 (H) 69.6 - 29.5 %   Platelets 337 150 - 400 K/uL   nRBC 0.0 0.0 - 0.2 %   Neutrophils Relative % 86 %   Neutro Abs 19.6 (H) 1.7 - 7.7 K/uL   Lymphocytes Relative 4 %   Lymphs Abs 1.0 0.7 - 4.0 K/uL   Monocytes Relative 8 %   Monocytes Absolute 1.9 (H) 0.1 - 1.0 K/uL   Eosinophils Relative 1 %   Eosinophils Absolute 0.2 0.0 - 0.5 K/uL   Basophils Relative 0 %   Basophils Absolute 0.1 0.0 - 0.1 K/uL   Immature Granulocytes 1 %   Abs Immature Granulocytes 0.19 (H) 0.00 - 0.07 K/uL    Comment: Performed at [facility], [address] [place], [place] [address]  Comprehensive metabolic panel     Status: Abnormal   Collection Time: 12/23/22  7:04 AM  Result Value Ref Range   Sodium 137 135 - 145 mmol/L   Potassium 3.3 (L) 3.5 - 5.1 mmol/L   Chloride 109 98 - 111 mmol/L   CO2 21 (L) 22 - 32 mmol/L   Glucose, Bld 87 70 - 99 mg/dL    Comment: Glucose reference range applies only to samples taken after fasting for at least 8 hours.   BUN 8 8 - 23 mg/dL   Creatinine, Ser 2.44 0.61 - 1.24 mg/dL   Calcium 7.9 (L) 8.9 - 10.3 mg/dL   Total Protein 5.6 (L) 6.5 - 8.1 g/dL   Albumin 2.1 (L) 3.5 - 5.0 g/dL   AST 37 15 - 41 U/L   ALT 30 0 - 44 U/L   Alkaline Phosphatase 84 38 - 126 U/L   Total Bilirubin 0.8 0.3 - 1.2 mg/dL   GFR, Estimated >01 >02 mL/min    Comment: (NOTE) Calculated using the CKD-EPI Creatinine  Equation (2021)    Anion gap 7 5 - 15    Comment: Performed at [facility], [address], [place], [place] [address]  Magnesium     Status: Abnormal   Collection Time: 12/23/22  7:04 AM  Result Value Ref Range   Magnesium 1.6 (L) 1.7 - 2.4 mg/dL    Comment: Performed at [facility], [address], [place], [place] [address]  Phosphorus  Status: Abnormal   Collection Time: 12/23/22  7:04 AM  Result Value Ref Range   Phosphorus 2.4 (L) 2.5 - 4.6 mg/dL    Comment: Performed at [facility], [address], [place], [place] [address]  HIV Antibody (routine testing w rflx)     Status: None   Collection Time: 12/23/22  7:04 AM  Result Value Ref Range   HIV Screen 4th Generation wRfx Non Reactive Non Reactive    Comment: Performed at [facility], [address], [place], [place] [address]  Hemoglobin A1c     Status: None   Collection Time: 12/23/22  7:04 AM  Result Value Ref Range   Hgb A1c MFr Bld 5.4 4.8 - 5.6 %    Comment: (NOTE)         Prediabetes: 5.7 - 6.4         Diabetes: >6.4         Glycemic control for adults with diabetes: <7.0    Mean Plasma Glucose 108 mg/dL    Comment: (NOTE) Performed At: [facility] [address] [place], [place] [id number] Jolene Schimke MD QM:[id number]   Glucose, capillary     Status: None   Collection Time: 12/23/22  7:46 AM  Result Value Ref Range   Glucose-Capillary 92 70 - 99 mg/dL    Comment: Glucose reference range applies only to samples taken after fasting for at least 8 hours.  Glucose, capillary     Status: None   Collection Time: 12/23/22 11:52 AM  Result Value Ref Range   Glucose-Capillary 99 70 - 99 mg/dL    Comment: Glucose reference range applies only to samples taken after fasting for at least 8 hours.  Glucose, capillary     Status: Abnormal   Collection Time: 12/23/22  4:41 PM  Result Value Ref Range   Glucose-Capillary 115 (H) 70 - 99 mg/dL     Comment: Glucose reference range applies only to samples taken after fasting for at least 8 hours.  Glucose, capillary     Status: Abnormal   Collection Time: 12/23/22 10:03 PM  Result Value Ref Range   Glucose-Capillary 129 (H) 70 - 99 mg/dL    Comment: Glucose reference range applies only to samples taken after fasting for at least 8 hours.  CBC with Differential     Status: Abnormal   Collection Time: 12/24/22  5:30 AM  Result Value Ref Range   WBC 17.2 (H) 4.0 - 10.5 K/uL   RBC 3.09 (L) 4.22 - 5.81 MIL/uL   Hemoglobin 8.0 (L) 13.0 - 17.0 g/dL   HCT 28.4 (L) 13.2 - 44.0 %   MCV 78.6 (L) 80.0 - 100.0 fL   MCH 25.9 (L) 26.0 - 34.0 pg   MCHC 32.9 30.0 - 36.0 g/dL   RDW 10.2 (H) 72.5 - 36.6 %   Platelets 365 150 - 400 K/uL   nRBC 0.0 0.0 - 0.2 %   Neutrophils Relative % 82 %   Neutro Abs 14.0 (H) 1.7 - 7.7 K/uL   Lymphocytes Relative 6 %   Lymphs Abs 1.1 0.7 - 4.0 K/uL   Monocytes Relative 9 %   Monocytes Absolute 1.5 (H) 0.1 - 1.0 K/uL   Eosinophils Relative 2 %   Eosinophils Absolute 0.4 0.0 - 0.5 K/uL   Basophils Relative 0 %   Basophils Absolute 0.1 0.0 - 0.1 K/uL   Immature Granulocytes 1 %   Abs Immature Granulocytes 0.20 (H) 0.00 - 0.07  K/uL    Comment: Performed at [facility], [address], [place], [place] [address]  Basic metabolic panel     Status: Abnormal   Collection Time: 12/24/22  5:30 AM  Result Value Ref Range   Sodium 136 135 - 145 mmol/L   Potassium 3.3 (L) 3.5 - 5.1 mmol/L   Chloride 109 98 - 111 mmol/L   CO2 20 (L) 22 - 32 mmol/L   Glucose, Bld 129 (H) 70 - 99 mg/dL    Comment: Glucose reference range applies only to samples taken after fasting for at least 8 hours.   BUN 7 (L) 8 - 23 mg/dL   Creatinine, Ser 6.04 0.61 - 1.24 mg/dL   Calcium 8.1 (L) 8.9 - 10.3 mg/dL   GFR, Estimated >54 >09 mL/min    Comment: (NOTE) Calculated using the CKD-EPI Creatinine Equation (2021)    Anion gap 7 5 - 15    Comment: Performed at  [name], [address], [place], [place] [address]  Magnesium     Status: None   Collection Time: 12/24/22  5:30 AM  Result Value Ref Range   Magnesium 1.8 1.7 - 2.4 mg/dL    Comment: Performed at [facility], [address] [place], [place] [address]  Glucose, capillary     Status: Abnormal   Collection Time: 12/24/22  7:23 AM  Result Value Ref Range   Glucose-Capillary 113 (H) 70 - 99 mg/dL    Comment: Glucose reference range applies only to samples taken after fasting for at least 8 hours.  Glucose, capillary     Status: Abnormal   Collection Time: 12/24/22 12:01 PM  Result Value Ref Range   Glucose-Capillary 128 (H) 70 - 99 mg/dL    Comment: Glucose reference range applies only to samples taken after fasting for at least 8 hours.  Glucose, capillary     Status: Abnormal   Collection Time: 12/24/22  4:54 PM  Result Value Ref Range   Glucose-Capillary 133 (H) 70 - 99 mg/dL    Comment: Glucose reference range applies only to samples taken after fasting for at least 8 hours.  Glucose, capillary     Status: Abnormal   Collection Time: 12/24/22  9:52 PM  Result Value Ref Range   Glucose-Capillary 134 (H) 70 - 99 mg/dL    Comment: Glucose reference range applies only to samples taken after fasting for at least 8 hours.  CBC with Differential     Status: Abnormal   Collection Time: 12/25/22  5:34 AM  Result Value Ref Range   WBC 11.0 (H) 4.0 - 10.5 K/uL   RBC 3.15 (L) 4.22 - 5.81 MIL/uL   Hemoglobin 8.0 (L) 13.0 - 17.0 g/dL    Comment: Reticulocyte Hemoglobin testing may be clinically indicated, consider ordering this additional test [id number]    HCT 24.5 (L) 39.0 - 52.0 %   MCV 77.8 (L) 80.0 - 100.0 fL   MCH 25.4 (L) 26.0 - 34.0 pg   MCHC 32.7 30.0 - 36.0 g/dL   RDW 57.8 (H) 46.9 - 62.9 %   Platelets 376 150 - 400 K/uL   nRBC 0.0 0.0 - 0.2 %   Neutrophils Relative % 73 %   Neutro Abs 8.1 (H) 1.7 - 7.7 K/uL   Lymphocytes Relative  10 %   Lymphs Abs 1.2 0.7 - 4.0 K/uL   Monocytes Relative 10 %   Monocytes Absolute 1.2 (H) 0.1 - 1.0 K/uL  Eosinophils Relative 4 %   Eosinophils Absolute 0.4 0.0 - 0.5 K/uL   Basophils Relative 1 %   Basophils Absolute 0.1 0.0 - 0.1 K/uL   Immature Granulocytes 2 %   Abs Immature Granulocytes 0.21 (H) 0.00 - 0.07 K/uL    Comment: Performed at [facility] [address] [place], [place] [address]  Basic metabolic panel     Status: Abnormal   Collection Time: 12/25/22  5:34 AM  Result Value Ref Range   Sodium 137 135 - 145 mmol/L   Potassium 3.6 3.5 - 5.1 mmol/L   Chloride 110 98 - 111 mmol/L   CO2 23 22 - 32 mmol/L   Glucose, Bld 131 (H) 70 - 99 mg/dL    Comment: Glucose reference range applies only to samples taken after fasting for at least 8 hours.   BUN 6 (L) 8 - 23 mg/dL   Creatinine, Ser 6.04 0.61 - 1.24 mg/dL   Calcium 8.7 (L) 8.9 - 10.3 mg/dL   GFR, Estimated >54 >09 mL/min    Comment: (NOTE) Calculated using the CKD-EPI Creatinine Equation (2021)    Anion gap 4 (L) 5 - 15    Comment: Performed at [facility], [address], [place], [place] [address]  Magnesium     Status: None   Collection Time: 12/25/22  5:34 AM  Result Value Ref Range   Magnesium 1.7 1.7 - 2.4 mg/dL    Comment: Performed at [facility], [address], [place], [place] [address]  Glucose, capillary     Status: Abnormal   Collection Time: 12/25/22  7:27 AM  Result Value Ref Range   Glucose-Capillary 107 (H) 70 - 99 mg/dL    Comment: Glucose reference range applies only to samples taken after fasting for at least 8 hours.   *Note: Due to a large number of results and/or encounters for the requested time period, some results have not been displayed. A complete set of results can be found in Results Review.   Recent Results (from the past 240 hour(s))  Culture, Blood     Status: None (Preliminary result)   Collection Time: 12/22/22  10:56 AM   Specimen: BLOOD LEFT ARM  Result Value Ref Range Status   Specimen Description BLOOD LEFT ARM  Final   Special Requests   Final    BOTTLES DRAWN AEROBIC AND ANAEROBIC Blood Culture adequate volume   Culture   Final    NO GROWTH 3 DAYS Performed at [facility], [address] [place], [place] [address]    Report Status PENDING  Incomplete  Urine Culture     Status: Abnormal   Collection Time: 12/22/22  2:40 PM   Specimen: Urine, Clean Catch  Result Value Ref Range Status   Specimen Description   Final    URINE, CLEAN CATCH Performed at [facility], [address], [place], [place] [address]    Special Requests   Final    NONE Performed at [facility], [address] [place], [place] [address]    Culture >=100,000 COLONIES/mL PROTEUS MIRABILIS (A)  Final   Report Status 12/24/2022 FINAL  Final   Organism ID, Bacteria PROTEUS MIRABILIS (A)  Final      Susceptibility   Proteus mirabilis - MIC*    AMPICILLIN 8 SENSITIVE Sensitive     CEFAZOLIN <=4 SENSITIVE Sensitive     CEFEPIME <=0.12 SENSITIVE Sensitive  CEFTRIAXONE <=0.25 SENSITIVE Sensitive     CIPROFLOXACIN >=4 RESISTANT Resistant     GENTAMICIN <=1 SENSITIVE Sensitive     IMIPENEM 1 SENSITIVE Sensitive     NITROFURANTOIN RESISTANT Resistant     TRIMETH/SULFA <=20 SENSITIVE Sensitive     AMPICILLIN/SULBACTAM <=2 SENSITIVE Sensitive     * >=100,000 COLONIES/mL PROTEUS MIRABILIS   Creatinine: Recent Labs    12/22/22 0902 12/23/22 0704 12/24/22 0530 12/25/22 0534  CREATININE 0.75 0.66 0.68 0.69    Imaging: See report/chart 3 x 2 x 2 homogeneous echogenicity of the right testy.  2 x 3 x 2 mixed echogenic area adjacent to inferior margin on the left.  Small to moderate bilateral hydroceles  Assessment/Plan:  SPT draining clear yellow urine.  Physical exam shows a draining abscess on the right posterior hemiscrotum with fluctuance and  necrotic borders.  To the OR for scrotal exploration and debridement. Images in media tab  Mild leukocytosis, normothermic.  Broad-spectrum ABX per primary.  Tailor to eventual abscess cultures.  Scherrie Bateman Ronisha Herringshaw 12/25/2022, 10:47 AM  Pager: [phone]

## 2022-12-25 NOTE — Progress Notes (Addendum)
PROGRESS NOTE    [name]  WUJ:[id number] DOB: July 14, 1956 DOA: 12/22/2022 PCP: Jethro Bastos, MD   Brief Narrative:   67 y.o. 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  ESR was 99.  Chest x-ray showed no acute cardiopulmonary process.  UA was suggestive of UTI.  He was started on IV fluids and antibiotics.  Assessment & Plan:   Possible scrotal abscess -He was found to have scrotal drainage on 12/24/2022.  Scrotal ultrasound was suggestive of chronic inflammatory process or unusual neoplastic process.  I have consulted urology.  Follow recommendations.  Keep him n.p.o. -Currently on Rocephin and vancomycin.  Vancomycin added on 12/24/2022.  UTI: Present on admission, possibly associated with neurogenic bladder SIRS Leukocytosis -Currently on Rocephin.  Blood cultures negative so far.  Urine culture growing Proteus -WBCs improving to 11 today  Multiple myeloma in relapse -Currently undergoing chemotherapy as an outpatient as per oncology.  Outpatient follow-up with oncology  Iron deficiency anemia--continue oral iron supplementation.  Transfuse if hemoglobin is less than 7  Hypoalbuminemia -Follow nutrition recommendations.  History of DVT -Presented to held on 12/24/2022 in case patient needs surgical procedures.  Diabetes mellitus type 2 -Blood sugars controlled.   N.p.o. for now till urology evaluation.  OSA -Declined to use CPAP at bedtime  Hypokalemia -Improved  Hypomagnesemia -Improved  Paraplegia with neurogenic bowel and bladder -Continue supportive care.  Fall precautions.    Thrombocytosis -Resolved  Obesity -Outpatient follow-up  DVT prophylaxis: Xarelto Code Status: Full Family Communication: wife on phone Disposition Plan: Status is: Inpatient Remains inpatient appropriate because: Of severity of illness.  Need for IV antibiotics.  Urology eval  Consultants: Urology  Procedures: None  Antimicrobials: Rocephin from 12/22/2022 onwards Vancomycin from 12/24/2022 onwards  Subjective: Patient seen and examined at bedside.  Denies worsening shortness of breath, fever or vomiting.   Objective: Vitals:   12/24/22 1314 12/24/22 1334 12/24/22 2032 12/25/22 0608  BP: 109/67 130/81 114/68 117/66  Pulse: 79 75 78 83  Resp: 20  18   Temp: 97.8 F (36.6 C) 97.9 F (36.6 C) 98.1 F (36.7 C) 97.6 F (36.4 C)  TempSrc: Oral Oral Oral Oral  SpO2: 98% 97% 96% 96%  Weight:      Height:        Intake/Output Summary (Last 24 hours) at 12/25/2022 1119 Last data filed at 12/25/2022 0600 Gross per 24 hour  Intake 407.27 ml  Output 1175 ml  Net -767.73 ml    Filed Weights   12/22/22 1500  Weight: 109.8 kg    Examination:  General: No acute distress.  On room air currently.   ENT/neck: No obvious JVD vision or palpable neck masses noted  respiratory: Bilateral decreased breath sounds at bases with scattered crackles  CVS: Rate mostly controlled; S1 and S2 are heard Abdominal: Soft, obese, nontender, distended mildly; no organomegaly, bowel sounds are heard normally Extremities: No clubbing; mild lower extremity edema present CNS: Paraplegic.  Alert and oriented. Lymph: No obvious cervical lymphadenopathy psych: Mood, affect and judgment are normal  musculoskeletal: No obvious joint swelling/deformity     Data Reviewed:  I have personally reviewed following labs and imaging studies  CBC: Recent Labs  Lab 12/22/22 0902 12/23/22 0704 12/24/22 0530 12/25/22 0534  WBC 26.7* 23.0* 17.2* 11.0*  NEUTROABS 23.7* 19.6* 14.0* 8.1*  HGB 9.6* 8.0* 8.0* 8.0*  HCT 27.9* 24.0* 24.3* 24.5*  MCV 77.1* 78.9* 78.6* 77.8*  PLT 409* 337 365 376    Basic Metabolic Panel: Recent Labs  Lab 12/22/22 0902 12/23/22 0704 12/24/22 0530 12/25/22 0534  NA 137 137 136 137  K 3.6 3.3* 3.3* 3.6  CL 102 109 109 110  CO2 27 21* 20* 23  GLUCOSE 125* 87 129* 131*  BUN 11 8 7* 6*  CREATININE 0.75 0.66 0.68 0.69  CALCIUM 9.2 7.9* 8.1* 8.7*  MG  --  1.6* 1.8 1.7  PHOS  --  2.4*  --   --     GFR: Estimated Creatinine Clearance: 106.3 mL/min (by C-G formula based on SCr of 0.69 mg/dL). Liver Function Tests: Recent Labs  Lab 12/22/22 0902 12/23/22 0704  AST 38 37  ALT 32 30  ALKPHOS 108 84  BILITOT 1.1 0.8  PROT 7.2 5.6*  ALBUMIN 3.3* 2.1*    No results for input(s): "LIPASE", "AMYLASE" in the last 168 hours. No results for input(s): "AMMONIA" in the last 168 hours. Coagulation Profile: No results for input(s): "INR", "PROTIME" in the last 168 hours. Cardiac Enzymes: No results for input(s): "CKTOTAL", "CKMB", "CKMBINDEX", "TROPONINI" in the last 168 hours. BNP (last 3 results) No results for input(s): "PROBNP" in the last 8760 hours. HbA1C: Recent Labs    12/23/22 0704  HGBA1C 5.4   CBG: Recent Labs  Lab 12/24/22 0723 12/24/22 1201 12/24/22 1654 12/24/22 2152 12/25/22 0727  GLUCAP 113* 128* 133* 134* 107*    Lipid Profile: No results for input(s): "CHOL", "HDL", "LDLCALC", "TRIG", "CHOLHDL", "LDLDIRECT" in the last 72 hours. Thyroid Function Tests: No results for input(s): "TSH", "T4TOTAL", "FREET4", "T3FREE", "THYROIDAB" in the last 72 hours. Anemia Panel: Recent Labs    12/22/22 1122  FERRITIN 689*  TIBC 174*  IRON 18*    Sepsis Labs: No results for input(s): "PROCALCITON",  "LATICACIDVEN" in the last 168 hours.  Recent Results (from the past 240 hour(s))  Culture, Blood     Status: None (Preliminary result)   Collection Time: 12/22/22 10:56 AM   Specimen: BLOOD LEFT ARM  Result Value Ref Range Status   Specimen Description BLOOD LEFT ARM  Final   Special Requests   Final    BOTTLES DRAWN AEROBIC AND ANAEROBIC Blood Culture adequate volume   Culture   Final    NO GROWTH 3 DAYS Performed at [facility], [address] [place], [place] [address]    Report Status PENDING  Incomplete  Urine Culture     Status: Abnormal   Collection Time: 12/22/22  2:40 PM   Specimen: Urine, Clean Catch  Result Value Ref Range Status   Specimen Description   Final    URINE, CLEAN CATCH Performed at [facility], [address], [place], [place] [address]    Special Requests   Final    NONE Performed at [facility], [address], [place], [place] [address]    Culture >=100,000 COLONIES/mL PROTEUS MIRABILIS (A)  Final   Report Status 12/24/2022 FINAL  Final  Organism ID, Bacteria PROTEUS MIRABILIS (A)  Final      Susceptibility   Proteus mirabilis - MIC*    AMPICILLIN 8 SENSITIVE Sensitive     CEFAZOLIN <=4 SENSITIVE Sensitive     CEFEPIME <=0.12 SENSITIVE Sensitive     CEFTRIAXONE <=0.25 SENSITIVE Sensitive     CIPROFLOXACIN >=4 RESISTANT Resistant     GENTAMICIN <=1 SENSITIVE Sensitive     IMIPENEM 1 SENSITIVE Sensitive     NITROFURANTOIN RESISTANT Resistant     TRIMETH/SULFA <=20 SENSITIVE Sensitive     AMPICILLIN/SULBACTAM <=2 SENSITIVE Sensitive     * >=100,000 COLONIES/mL PROTEUS MIRABILIS         Radiology Studies: US SCROTUM W/DOPPLER  Result Date: 12/24/2022 CLINICAL DATA:  Pain and swelling EXAM: SCROTAL ULTRASOUND DOPPLER ULTRASOUND OF THE TESTICLES TECHNIQUE: Complete ultrasound examination of the testicles, epididymis, and other scrotal structures was performed. Color and spectral  Doppler ultrasound were also utilized to evaluate blood flow to the testicles. COMPARISON:  None Available. FINDINGS: Right testicle Measurements: 3.1 x 2.1 x 2.4 cm. There is slightly in homogeneous echogenicity in the right testis without discrete focal lesions. There is 2 x 2.1 x 2.6 cm hyperechoic area adjacent to the inferior margin of the right testis. Left testicle Measurements: 2.6 x 1.4 x 2.2 cm. There is slightly inhomogeneous echogenicity in the left testis. There is 2.3 x 2.9 x 1.9 cm mixed echogenic area adjacent to the inferior margin of the left testis. Right epididymis:  Normal in size and appearance. Left epididymis:  Normal in size and appearance. Hydrocele:  Small to moderate bilateral hydronephrosis. Varicocele:  None visualized. Pulsed Doppler interrogation of both testes demonstrates normal low resistance arterial and venous waveforms bilaterally. IMPRESSION: There is no evidence of testicular torsion. The slightly inhomogeneous echogenicity in both testes without discrete measurable focal lesions. There is 2 x 2.1 x 2.6 cm hyperechoic area adjacent to the inferior margin of the right testis. There is 2.3 x 2.9 x 1.9 cm mixed echogenic area inferior to the left testis. Significance of this finding is not clear. This may suggest chronic inflammatory process or unusual neoplastic process. Urology consultation and short-term follow-up scrotal sonogram may be considered. Moderate bilateral hydronephrosis. Electronically Signed   By: Ernie Avena M.D.   On: 12/24/2022 18:17        Scheduled Meds:  acyclovir  400 mg Oral BID   atorvastatin  20 mg Oral Daily   cholecalciferol  1,000 Units Oral Daily   dorzolamide-timolol  1 drop Both Eyes BID   feeding supplement (GLUCERNA SHAKE)  237 mL Oral BID BM   iron polysaccharides  150 mg Oral Daily   latanoprost  1 drop Both Eyes QHS   multivitamin with minerals  1 tablet Oral Daily   zinc oxide  1 Application Topical BID   Continuous  Infusions:  cefTRIAXone (ROCEPHIN)  IV Stopped (12/24/22 1910)   vancomycin 1,000 mg (12/25/22 0301)          Glade Lloyd, MD [facility] 12/25/2022, 11:19 AM

## 2022-12-25 NOTE — Progress Notes (Signed)
CBG checked at 13:09 at bedside with Dr. Isaias Cowman and L. Noe Gens, CRNA present. CBG noted to be 66. No new orders at this time as concern that operative meds will increase blood sugar. Will recheck in 1 hr intra-op, CRNA aware and agreeable to plan.

## 2022-12-25 NOTE — Interval H&P Note (Signed)
History and Physical Interval Note:  12/25/2022 12:54 PM  [name]  has presented today for surgery, with the diagnosis of GANGRENE SCROTUM.  The various methods of treatment have been discussed with the patient and family. After consideration of risks, benefits and other options for treatment, the patient has consented to  Procedure(s): INCISION AND DRAINAGE OF SCROTUM (N/A) POSSIBLE ORCHIECTOMY (N/A) as a surgical intervention.  The patient's history has been reviewed, patient examined, no change in status, stable for surgery.  I have reviewed the patient's chart and labs.  Questions were answered to the patient's satisfaction.     Aydn Ferrara L Bassheva Flury

## 2022-12-25 NOTE — Op Note (Signed)
Preoperative diagnosis:  1. Scrotal abscess  Postoperative diagnosis: 1. Fournier's gangrene  Procedure(s): 1. Debridement of necrotic tissue  Surgeon: Dr. Irine Seal  Anesthesia: general  Complications: none  EBL: 30 cc  Specimens: none  Disposition of specimens: n/a  Intraoperative findings: necrotic scrotal tissue. No discreet pockets of purulent material. Testicles not affected and tunica intact. I did not appreciate any peri-rectal involvement today  Indication: see HP from today  Description of procedure:  With appropriate consent the patient was brought to the operative suite where anesthesia was induced.  He was prepped and draped in the usual sterile fashion in the dorsolithotomy position.  Timeout was performed.    On inspection there was a silver dollar sized open ulcerative lesion to the posterior most portion of the scrotum.  I began by further opening this area.  Ultimately I excised close to a 10 x 10 cm area of scrotum. Some of the dartos and deeper scrotal layers were affected and excised as well.  The necrotic tracked toward the corpora and urethra. There were no discrete pus pockets.   I could digitally probe toward the rectum but did not reach the rectum and there was no purulent material.  I carefulyl inspected the testicles which were viable and left intact.  There were small hydroceles bilaterally.  Using an Asepto syringe, I injected down the urethra and there is no evidence of urethral damage.  After obtaining hemostasis with the Bovie, I carefully packed the wound with 2 Kerlix's total.  Patient tolerated procedure well.  Plan: Return to floor.  Okay to give diet today.  Patient will likely need a second look debridement later this week.  Continue to hold his blood thinner in anticipation of further visits to the OR.  Irine Seal MD 12/25/2022, 2:49 PM  [facility]  Pager: [phone]

## 2022-12-25 NOTE — Anesthesia Procedure Notes (Signed)
Procedure Name: Intubation Date/Time: 12/25/2022 1:41 PM  Performed by: Orest Dikes, CRNAPre-anesthesia Checklist: Patient identified, Emergency Drugs available, Suction available and Patient being monitored Patient Re-evaluated:Patient Re-evaluated prior to induction Oxygen Delivery Method: Circle system utilized Preoxygenation: Pre-oxygenation with 100% oxygen Induction Type: IV induction Ventilation: Mask ventilation without difficulty Laryngoscope Size: Mac and 4 Grade View: Grade I Tube type: Oral Tube size: 7.5 mm Number of attempts: 1 Airway Equipment and Method: Stylet Placement Confirmation: ETT inserted through vocal cords under direct vision, positive ETCO2 and breath sounds checked- equal and bilateral Secured at: 21 cm Tube secured with: Tape Dental Injury: Teeth and Oropharynx as per pre-operative assessment

## 2022-12-25 NOTE — Care Management Important Message (Signed)
Important Message  Patient Details IM Letter given. Name: [name] MRN: [id number] Date of Birth: 16-Apr-1956   Medicare Important Message Given:  Yes     [name] 12/25/2022, 9:15 AM

## 2022-12-25 NOTE — Transfer of Care (Signed)
Immediate Anesthesia Transfer of Care Note  Patient: [name]  Procedure(s) Performed: INCISION AND DRAINAGE OF SCROTUM (Scrotum)  Patient Location: PACU  Anesthesia Type:General  Level of Consciousness: sedated  Airway & Oxygen Therapy: Patient Spontanous Breathing and Patient connected to face mask oxygen  Post-op Assessment: Report given to RN and Post -op Vital signs reviewed and stable  Post vital signs: Reviewed and stable  Last Vitals:  Vitals Value Taken Time  BP    Temp    Pulse 104 12/25/22 1503  Resp 17 12/25/22 1504  SpO2 100 % 12/25/22 1503  Vitals shown include unvalidated device data.  Last Pain:  Vitals:   12/25/22 1303  TempSrc: Oral  PainSc:          Complications: No notable events documented.

## 2022-12-26 ENCOUNTER — Encounter (HOSPITAL_COMMUNITY): Payer: Self-pay | Admitting: Urology

## 2022-12-26 DIAGNOSIS — G8220 Paraplegia, unspecified: Secondary | ICD-10-CM | POA: Diagnosis not present

## 2022-12-26 DIAGNOSIS — N319 Neuromuscular dysfunction of bladder, unspecified: Secondary | ICD-10-CM | POA: Diagnosis not present

## 2022-12-26 DIAGNOSIS — R6510 Systemic inflammatory response syndrome (SIRS) of non-infectious origin without acute organ dysfunction: Secondary | ICD-10-CM | POA: Diagnosis not present

## 2022-12-26 LAB — CBC WITH DIFFERENTIAL/PLATELET
Abs Immature Granulocytes: 0.31 10*3/uL — ABNORMAL HIGH (ref 0.00–0.07)
Basophils Absolute: 0 10*3/uL (ref 0.0–0.1)
Basophils Relative: 0 %
Eosinophils Absolute: 0 10*3/uL (ref 0.0–0.5)
Eosinophils Relative: 0 %
HCT: 22.8 % — ABNORMAL LOW (ref 39.0–52.0)
Hemoglobin: 7.4 g/dL — ABNORMAL LOW (ref 13.0–17.0)
Immature Granulocytes: 2 %
Lymphocytes Relative: 5 %
Lymphs Abs: 0.9 10*3/uL (ref 0.7–4.0)
MCH: 25.5 pg — ABNORMAL LOW (ref 26.0–34.0)
MCHC: 32.5 g/dL (ref 30.0–36.0)
MCV: 78.6 fL — ABNORMAL LOW (ref 80.0–100.0)
Monocytes Absolute: 0.8 10*3/uL (ref 0.1–1.0)
Monocytes Relative: 4 %
Neutro Abs: 16.8 10*3/uL — ABNORMAL HIGH (ref 1.7–7.7)
Neutrophils Relative %: 89 %
Platelets: 385 10*3/uL (ref 150–400)
RBC: 2.9 MIL/uL — ABNORMAL LOW (ref 4.22–5.81)
RDW: 19.7 % — ABNORMAL HIGH (ref 11.5–15.5)
WBC: 18.9 10*3/uL — ABNORMAL HIGH (ref 4.0–10.5)
nRBC: 0 % (ref 0.0–0.2)

## 2022-12-26 LAB — GLUCOSE, CAPILLARY
Glucose-Capillary: 205 mg/dL — ABNORMAL HIGH (ref 70–99)
Glucose-Capillary: 167 mg/dL — ABNORMAL HIGH (ref 70–99)
Glucose-Capillary: 118 mg/dL — ABNORMAL HIGH (ref 70–99)
Glucose-Capillary: 173 mg/dL — ABNORMAL HIGH (ref 70–99)

## 2022-12-26 LAB — BASIC METABOLIC PANEL
Anion gap: 7 (ref 5–15)
BUN: 10 mg/dL (ref 8–23)
CO2: 23 mmol/L (ref 22–32)
Calcium: 8.6 mg/dL — ABNORMAL LOW (ref 8.9–10.3)
Chloride: 107 mmol/L (ref 98–111)
Creatinine, Ser: 0.81 mg/dL (ref 0.61–1.24)
GFR, Estimated: >60 mL/min (ref >60)
Glucose, Bld: 197 mg/dL — ABNORMAL HIGH (ref 70–99)
Potassium: 4.9 mmol/L (ref 3.5–5.1)
Sodium: 137 mmol/L (ref 135–145)

## 2022-12-26 LAB — CULTURE, BLOOD (SINGLE)

## 2022-12-26 LAB — MAGNESIUM: Magnesium: 1.7 mg/dL (ref 1.7–2.4)

## 2022-12-26 MED ORDER — ENOXAPARIN SODIUM 60 MG/0.6ML IJ SOSY
60.0000 mg | PREFILLED_SYRINGE | INTRAMUSCULAR | Status: DC
  Administered 2022-12-26 – 2022-12-28 (×3): 60 mg via SUBCUTANEOUS
  Filled 2022-12-26 (×3): qty 0.6

## 2022-12-26 NOTE — Progress Notes (Signed)
Mobility Specialist - Progress Note   12/26/22 1056  Mobility  Activity  (UE Exercises)  Range of Motion/Exercises Right arm;Left arm  Activity Response Tolerated well  Mobility Referral Yes  $Mobility charge 1 Mobility  Mobility Specialist Start Time (ACUTE ONLY) 1039  Mobility Specialist Stop Time (ACUTE ONLY) 1054  Mobility Specialist Time Calculation (min) (ACUTE ONLY) 15 min   Pt received in bed and agreeable to do UE Exercises. Pt demonstrated exercises that he does at home & tolerated exercises given. No complaints during session. Pt to bed after session with all needs met. Refer below for exercises.  Supine BUE Exercises: 20 reps each   1) Elbow Flexion  2) Elbow extension   3) Horizontal Abduction              4) Punch Ups              5) Bodyweight Chest Press  [facility] Mobility Specialist

## 2022-12-26 NOTE — TOC Initial Note (Incomplete)
Transition of Care [facility]) - Initial/Assessment Note    Patient Details  Name: [name] MRN: [id number] Date of Birth: 09/11/1955  Transition [facility]) CM/SW Contact:    Beckie Busing, RN Phone Number:[phone]  12/26/2022, 3:55 PM  Clinical Narrative:                 TOC following patient. Patient admitted from home with wife and adult child. Patients care is managed by [facility]. Patient lives at home and baseline is max assist/ total. Patient is involved in the day program at [facility] where he has access to the clinic. Patient has PCP and access to medications all supported through [facility]. [facility] Home [facility] through [facility] and supplemented with [facility]. CM spokw with SW at [facility] who confirms that [facility] receives services through [facility] and has been able to function at home with [facility] and [facility] services for ADL's/ total care. TOC will continue to follow for disposition needs   Expected Discharge Plan: Home w Home Health Services Barriers to Discharge: Continued Medical Work up   Patient Goals and CMS Choice Patient states their goals for this hospitalization and ongoing recovery are:: To get better CMS Medicare.gov Compare Post Acute Care list provided to::  (n/a) Choice offered to / list presented to : NA [place] ownership interest in [facility].provided to::  (n/a)    Expected Discharge Plan and Services In-house Referral: NA Discharge Planning Services: CM Consult Post Acute Care Choice: NA                   DME Arranged: N/A DME Agency: NA       HH Arranged: [facility]CS/Personal Care Services (all [facility])          Prior Living Arrangements/Services   Lives with:: Spouse, Adult Children              Current home services: Other (comment) ([facility] day prog[facility] and home health services , [facility])    Activities of Daily Living Home Assistive Devices/Equipment: Nurse, adult, Ostomy supplies, Wheelchair ADL  Screening (condition at time of admission) Patient's cognitive ability adequate to safely complete daily activities?: Yes Is the patient deaf or have difficulty hearing?: No Does the patient have difficulty seeing, even when wearing glasses/contacts?: No Does the patient have difficulty concentrating, remembering, or making decisions?: No Patient able to express need for assistance with ADLs?: Yes Does the patient have difficulty dressing or bathing?: No Independently performs ADLs?: No Communication: Independent Dressing (OT): Needs assistance Is this a change from baseline?: Pre-admission baseline Grooming: Independent Feeding: Independent Bathing: Needs assistance Is this a change from baseline?: Pre-admission baseline Toileting: Independent In/Out Bed: Dependent Is this a change from baseline?: Pre-admission baseline Does the patient have difficulty walking or climbing stairs?: No Weakness of Legs: Both Weakness of Arms/Hands: Both  Permission Sought/Granted                  Emotional Assessment              Admission diagnosis:  SIRS (systemic inflammatory response syndrome) (HCC) [R65.10] Patient Active Problem List   Diagnosis Date Noted   SIRS (systemic inflammatory response syndrome) (HCC) 12/22/2022   Glaucoma 12/22/2022   OSA (obstructive sleep apnea) 11/01/2022   Snoring 09/21/2022   Goals of care, counseling/discussion 10/09/2019   On rivaroxaban therapy    Status post colonoscopy with polypectomy    Lower GI bleeding  04/18/2017   Acute blood loss anemia 04/18/2017   Type 2 diabetes mellitus with obesity (HCC) 04/18/2017   Colon cancer screening    Benign neoplasm of ascending colon    History of DVT (deep vein thrombosis) 06/10/2015   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   Bilateral hydronephrosis 07/21/2014   Acute kidney failure (HCC) 07/20/2014   Abdominal pain 07/20/2014   AKI (acute kidney injury) (HCC) 07/20/2014   Hematuria  02/12/2013   Shock (HCC) 02/12/2013   Adrenal insufficiency (HCC) 02/12/2013   Hypoalbuminemia 11/15/2012   Anemia, unspecified 09/18/2012   Unspecified vitamin D deficiency 09/18/2012   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   Iron deficiency anemia 09/18/2012   Diverticulitis of large intestine with perforation 07/26/2011   Multiple myeloma in relapse (HCC) 10/14/2010   Neurogenic bowel 10/14/2010   Paraplegia (HCC) 07/13/2010   Neurogenic bladder 07/11/2010   BACK PAIN, LUMBAR, WITH RADICULOPATHY 07/07/2010   DISEQUILIBRIUM 07/07/2010   Abdominal pain, generalized 07/07/2010   OBESITY, NOS 09/20/2006   PCP:  Jethro Bastos, MD Pharmacy:  No Pharmacies Listed    Social Determinants of Health (SDOH) Social History: SDOH Screenings   Food Insecurity: No Food Insecurity (12/22/2022)  Housing: Low Risk  (12/22/2022)  Transportation Needs: No Transportation Needs (12/22/2022)  Utilities: Not At Risk (12/22/2022)  Tobacco Use: Low Risk  (12/26/2022)   SDOH Interventions:     Readmission Risk Interventions    12/26/2022    3:45 PM 12/26/2022    3:25 PM  Readmission Risk Prevention Plan  Post Dischage Appt Complete Complete  Medication Screening Complete Complete  Transportation Screening Complete Complete

## 2022-12-26 NOTE — Progress Notes (Signed)
PROGRESS NOTE    [name]  ZOX:[id number] DOB: 1955/11/26 DOA: 12/22/2022 PCP: Jethro Bastos, MD   Brief Narrative:   67 y.o. 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  ESR was 99.  Chest x-ray showed no acute cardiopulmonary process.  UA was suggestive of UTI.  He was started on IV fluids and antibiotics.  He was found to have possible scrotal abscess; urology was consulted on 12/25/2022; he underwent debridement of necrotic tissue on 12/25/2022 by urology.  Assessment & Plan:   Fournier's gangrene -He was found to have scrotal drainage on 12/24/2022.  Scrotal ultrasound was suggestive of chronic inflammatory process or unusual neoplastic process.  -Underwent debridement of necrotic tissue on 12/25/2022.  Urology planning possible second look debridement later this week.  Wound care as per urology.  -Antibiotics were switched to Zosyn and Zyvox on 12/25/2022.  UTI: Present on admission, possibly associated with neurogenic bladder and suprapubic catheter SIRS Leukocytosis -Antibiotic plan as above.  Blood cultures negative so far.  Urine culture growing Proteus -WBCs worsened to 18.9 today.  Multiple myeloma in relapse -Currently undergoing chemotherapy as an outpatient as per oncology.  Outpatient follow-up with oncology  Iron deficiency anemia--continue oral iron  supplementation.  Transfuse if hemoglobin is less than 7  Hypoalbuminemia -Follow nutrition recommendations.  History of DVT -Xarelto on hold from 12/24/22.  Diabetes mellitus type 2 -Blood sugars controlled.    OSA -Declined to use CPAP at bedtime  Hypokalemia -Improved  Hypomagnesemia -Improved  Paraplegia with neurogenic bowel and bladder -Continue supportive care.  Fall precautions.    Thrombocytosis -Resolved  Obesity -Outpatient follow-up  DVT prophylaxis: Xarelto discontinued on 12/24/2022.  SCDs. Code Status: Full Family Communication: wife at bedside Disposition Plan: Status is: Inpatient Remains inpatient appropriate because: Of severity of illness.  Need for IV antibiotics.    Consultants: Urology  Procedures: As above  antimicrobials:  Anti-infectives (From admission, onward)    Start     Dose/Rate Route Frequency Ordered Stop   12/25/22 1730  linezolid (ZYVOX) IVPB 600 mg        600 mg 300 mL/hr over 60 Minutes Intravenous 2 times daily 12/25/22 1630     12/25/22 1730  piperacillin-tazobactam (ZOSYN) IVPB 3.375 g        3.375 g 12.5 mL/hr over 240 Minutes Intravenous Every 8 hours 12/25/22 1630     12/25/22 0300  vancomycin (VANCOCIN) IVPB 1000 mg/200 mL premix  Status:  Discontinued        1,000 mg 200 mL/hr over 60 Minutes Intravenous Every 12 hours 12/24/22 1344 12/25/22 1630   12/24/22 1430  vancomycin (VANCOREADY) IVPB 2000 mg/400 mL        2,000 mg 200 mL/hr over 120 Minutes Intravenous  Once 12/24/22 1342 12/24/22 1624   12/23/22 0000  ceFEPIme (MAXIPIME) 2 g in sodium chloride 0.9 % 100 mL IVPB  Status:  Discontinued        2 g  200 mL/hr over 30 Minutes Intravenous Every 8 hours 12/22/22 1501 12/22/22 1559   12/22/22 2200  acyclovir (ZOVIRAX) tablet 400 mg        400 mg Oral 2 times daily 12/22/22 1632     12/22/22 1800  cefTRIAXone (ROCEPHIN) 2 g in sodium chloride 0.9 % 100 mL IVPB  Status:  Discontinued        2 g 200 mL/hr over 30  Minutes Intravenous Every 24 hours 12/22/22 1559 12/25/22 1630   12/22/22 1530  ceFEPIme (MAXIPIME) 2 g in sodium chloride 0.9 % 100 mL IVPB  Status:  Discontinued        2 g 200 mL/hr over 30 Minutes Intravenous  Once 12/22/22 1436 12/22/22 1559       Subjective: Patient seen and examined at bedside.  No fever, vomiting, worsening shortness of breath reported.   Objective: Vitals:   12/25/22 1545 12/25/22 1837 12/25/22 2020 12/26/22 0452  BP: 98/70  136/73 109/65  Pulse: 100 88 83 75  Resp: 19  18 18   Temp: (!) 97.5 F (36.4 C)  97.7 F (36.5 C) 97.6 F (36.4 C)  TempSrc:   Oral Oral  SpO2: 100% 100% 99% 98%  Weight:      Height:        Intake/Output Summary (Last 24 hours) at 12/26/2022 0736 Last data filed at 12/26/2022 0700 Gross per 24 hour  Intake 1188.26 ml  Output 1950 ml  Net -761.74 ml    Filed Weights   12/22/22 1500 12/25/22 1258  Weight: 109.8 kg 109.8 kg    Examination:  General: Currently on room air.  No distress. ENT/neck: No no palpable thyromegaly or JVD elevation noted respiratory: Decreased sounds at bases bilaterally with some crackles  CVS: S1 and S2 heard; rate currently controlled Abdominal: Soft, obese, nontender, still slightly distended; no organomegaly, normal bowel sounds are heard extremities: Trace lower extremity edema; no cyanosis  CNS: Awake and alert.  Paraplegic Lymph: No palpable cervical lymphadenopathy noted  psych: Affect, mood and judgment appear normal musculoskeletal: No obvious joint erythema/tenderness    Data Reviewed: I have personally reviewed following labs and imaging studies  CBC: Recent Labs  Lab 12/22/22 0902 12/23/22 0704 12/24/22 0530 12/25/22 0534 12/26/22 0542  WBC 26.7* 23.0* 17.2* 11.0* 18.9*  NEUTROABS 23.7* 19.6* 14.0* 8.1* 16.8*  HGB 9.6* 8.0* 8.0* 8.0* 7.4*  HCT 27.9* 24.0* 24.3* 24.5* 22.8*  MCV 77.1* 78.9* 78.6* 77.8* 78.6*  PLT 409* 337 365 376 385    Basic Metabolic Panel: Recent  Labs  Lab 12/22/22 0902 12/23/22 0704 12/24/22 0530 12/25/22 0534 12/26/22 0542  NA 137 137 136 137 137  K 3.6 3.3* 3.3* 3.6 4.9  CL 102 109 109 110 107  CO2 27 21* 20* 23 23  GLUCOSE 125* 87 129* 131* 197*  BUN 11 8 7* 6* 10  CREATININE 0.75 0.66 0.68 0.69 0.81  CALCIUM 9.2 7.9* 8.1* 8.7* 8.6*  MG  --  1.6* 1.8 1.7 1.7  PHOS  --  2.4*  --   --   --     GFR: Estimated Creatinine Clearance: 104.9 mL/min (by C-G formula based on SCr of 0.81 mg/dL). Liver Function Tests: Recent Labs  Lab 12/22/22 0902 12/23/22 0704  AST 38 37  ALT 32 30  ALKPHOS 108 84  BILITOT 1.1 0.8  PROT 7.2 5.6*  ALBUMIN 3.3* 2.1*    No results for input(s): "LIPASE", "AMYLASE" in the last 168 hours. No results for  input(s): "AMMONIA" in the last 168 hours. Coagulation Profile: No results for input(s): "INR", "PROTIME" in the last 168 hours. Cardiac Enzymes: No results for input(s): "CKTOTAL", "CKMB", "CKMBINDEX", "TROPONINI" in the last 168 hours. BNP (last 3 results) No results for input(s): "PROBNP" in the last 8760 hours. HbA1C: No results for input(s): "HGBA1C" in the last 72 hours.  CBG: Recent Labs  Lab 12/25/22 1309 12/25/22 1412 12/25/22 1505 12/25/22 1714 12/25/22 2125  GLUCAP 66* 104* 107* 107* 230*    Lipid Profile: No results for input(s): "CHOL", "HDL", "LDLCALC", "TRIG", "CHOLHDL", "LDLDIRECT" in the last 72 hours. Thyroid Function Tests: No results for input(s): "TSH", "T4TOTAL", "FREET4", "T3FREE", "THYROIDAB" in the last 72 hours. Anemia Panel: No results for input(s): "VITAMINB12", "FOLATE", "FERRITIN", "TIBC", "IRON", "RETICCTPCT" in the last 72 hours.  Sepsis Labs: No results for input(s): "PROCALCITON", "LATICACIDVEN" in the last 168 hours.  Recent Results (from the past 240 hour(s))  Culture, Blood     Status: None (Preliminary result)   Collection Time: 12/22/22 10:56 AM   Specimen: BLOOD LEFT ARM  Result Value Ref Range Status   Specimen Description  BLOOD LEFT ARM  Final   Special Requests   Final    BOTTLES DRAWN AEROBIC AND ANAEROBIC Blood Culture adequate volume   Culture   Final    NO GROWTH 3 DAYS Performed at [facility], [address] [place], [place] [address]    Report Status PENDING  Incomplete  Urine Culture     Status: Abnormal   Collection Time: 12/22/22  2:40 PM   Specimen: Urine, Clean Catch  Result Value Ref Range Status   Specimen Description   Final    URINE, CLEAN CATCH Performed at [facility], [address], [place], [place] [address]    Special Requests   Final    NONE Performed at [facility], [address], [place], [place] [address]    Culture >=100,000 COLONIES/mL PROTEUS MIRABILIS (A)  Final   Report Status 12/24/2022 FINAL  Final   Organism ID, Bacteria PROTEUS MIRABILIS (A)  Final      Susceptibility   Proteus mirabilis - MIC*    AMPICILLIN 8 SENSITIVE Sensitive     CEFAZOLIN <=4 SENSITIVE Sensitive     CEFEPIME <=0.12 SENSITIVE Sensitive     CEFTRIAXONE <=0.25 SENSITIVE Sensitive     CIPROFLOXACIN >=4 RESISTANT Resistant     GENTAMICIN <=1 SENSITIVE Sensitive     IMIPENEM 1 SENSITIVE Sensitive     NITROFURANTOIN RESISTANT Resistant     TRIMETH/SULFA <=20 SENSITIVE Sensitive     AMPICILLIN/SULBACTAM <=2 SENSITIVE Sensitive     * >=100,000 COLONIES/mL PROTEUS MIRABILIS         Radiology Studies: US SCROTUM W/DOPPLER  Result Date: 12/24/2022 CLINICAL DATA:  Pain and swelling EXAM: SCROTAL ULTRASOUND DOPPLER ULTRASOUND OF THE TESTICLES TECHNIQUE: Complete ultrasound examination of the testicles, epididymis, and other scrotal structures was performed. Color and spectral Doppler ultrasound were also utilized to evaluate blood flow to the testicles. COMPARISON:  None Available. FINDINGS: Right testicle Measurements: 3.1 x 2.1 x 2.4 cm. There is slightly in homogeneous echogenicity in the right testis without discrete focal lesions.  There is 2 x 2.1 x 2.6 cm hyperechoic area adjacent to the inferior margin of the right testis. Left testicle Measurements: 2.6 x 1.4 x 2.2 cm. There is slightly inhomogeneous echogenicity in the left testis. There is 2.3 x 2.9 x 1.9 cm mixed echogenic area adjacent  to the inferior margin of the left testis. Right epididymis:  Normal in size and appearance. Left epididymis:  Normal in size and appearance. Hydrocele:  Small to moderate bilateral hydronephrosis. Varicocele:  None visualized. Pulsed Doppler interrogation of both testes demonstrates normal low resistance arterial and venous waveforms bilaterally. IMPRESSION: There is no evidence of testicular torsion. The slightly inhomogeneous echogenicity in both testes without discrete measurable focal lesions. There is 2 x 2.1 x 2.6 cm hyperechoic area adjacent to the inferior margin of the right testis. There is 2.3 x 2.9 x 1.9 cm mixed echogenic area inferior to the left testis. Significance of this finding is not clear. This may suggest chronic inflammatory process or unusual neoplastic process. Urology consultation and short-term follow-up scrotal sonogram may be considered. Moderate bilateral hydronephrosis. Electronically Signed   By: Ernie Avena M.D.   On: 12/24/2022 18:17        Scheduled Meds:  acyclovir  400 mg Oral BID   atorvastatin  20 mg Oral Daily   cholecalciferol  1,000 Units Oral Daily   dorzolamide-timolol  1 drop Both Eyes BID   feeding supplement (GLUCERNA SHAKE)  237 mL Oral BID BM   iron polysaccharides  150 mg Oral Daily   latanoprost  1 drop Both Eyes QHS   multivitamin with minerals  1 tablet Oral Daily   zinc oxide  1 Application Topical BID   Continuous Infusions:  linezolid (ZYVOX) IV 600 mg (12/26/22 0557)   piperacillin-tazobactam (ZOSYN)  IV Stopped (12/26/22 0551)          Glade Lloyd, MD [facility] 12/26/2022, 7:36 AM

## 2022-12-26 NOTE — Progress Notes (Signed)
1 Day Post-Op Subjective: [name] is a 67 year old male seen in treatment for Fournier's gangrene.  6/4: No acute events overnight.  Patient has no feeling below the waist so has been unbothered by surgical pain.  Objective: Vital signs in last 24 hours: Temp:  [97.4 F (36.3 C)-97.7 F (36.5 C)] 97.4 F (36.3 C) (06/04 1320) Pulse Rate:  [75-119] 81 (06/04 1320) Resp:  [15-19] 18 (06/04 0452) BP: (93-136)/(65-78) 115/67 (06/04 1320) SpO2:  [98 %-100 %] 100 % (06/04 1320)  Intake/Output from previous day: 06/03 0701 - 06/04 0700 In: 1188.3 [P.O.:890; IV Piggyback:298.3] Out: 1950 [Urine:1800; Blood:150]  Intake/Output this shift: Total I/O In: 240 [P.O.:240] Out: -   Physical Exam:  General: Alert and oriented CV: No cyanosis Lungs: equal chest rise Abdomen: Soft, NTND, no rebound or guarding Gu: Significant debridement of the right hemiscrotum and internal tissue tracking towards the rectum and up towards the right inguinal canal.  Lab Results: Recent Labs    12/24/22 0530 12/25/22 0534 12/26/22 0542  HGB 8.0* 8.0* 7.4*  HCT 24.3* 24.5* 22.8*   BMET Recent Labs    12/25/22 0534 12/26/22 0542  NA 137 137  K 3.6 4.9  CL 110 107  CO2 23 23  GLUCOSE 131* 197*  BUN 6* 10  CREATININE 0.69 0.81  CALCIUM 8.7* 8.6*     Studies/Results: No results found.  Assessment/Plan: S/p significant debridement of the right hemiscrotum with Dr. Lafonda Mosses on 12/25/2022 for Fournier's gangrene.  Wound care was performed by myself.  No indication for second look in the OR at this point.  Wound bed is beefy pink without purulent drainage.  If updated by phone  Will continue to see him regularly and once wound is stable and has made some progress, we will involve plastics to get their input about closure  WBC 11.0-->18.9, broad ABX per primary. No fluid collection available intraoperatively to culture.    LOS: 4 days   Elmon Kirschner, NP [facility] Pager: [phone]  12/26/2022, 2:56 PM

## 2022-12-26 NOTE — Progress Notes (Signed)
Mobility Specialist - Progress Note   12/26/22 1537  Mobility  Activity  (UE Exercises)  Range of Motion/Exercises Right arm;Left arm  Activity Response Tolerated well  Mobility Referral Yes  $Mobility charge 1 Mobility  Mobility Specialist Start Time (ACUTE ONLY) 0313  Mobility Specialist Stop Time (ACUTE ONLY) 0336  Mobility Specialist Time Calculation (min) (ACUTE ONLY) 23 min   Pt received in bed and agreeable to do bed level UE exercises. See below for exercises. No complaints during session. Pt to bed after session with all needs met.  Supine BUE Exercises: 20 reps each,  dark green resistance band  1) Elbow Flexion  2) Elbow extension   3) Horizontal Abduction              4) Stress ball squeezes             5) Overhead extensions  Chief Technology Officer

## 2022-12-27 DIAGNOSIS — N493 Fournier gangrene: Principal | ICD-10-CM | POA: Insufficient documentation

## 2022-12-27 LAB — BASIC METABOLIC PANEL
Anion gap: 8 (ref 5–15)
BUN: 8 mg/dL (ref 8–23)
CO2: 26 mmol/L (ref 22–32)
Calcium: 8.8 mg/dL — ABNORMAL LOW (ref 8.9–10.3)
Chloride: 110 mmol/L (ref 98–111)
Creatinine, Ser: 0.67 mg/dL (ref 0.61–1.24)
GFR, Estimated: >60 mL/min (ref >60)
Glucose, Bld: 98 mg/dL (ref 70–99)
Potassium: 3.9 mmol/L (ref 3.5–5.1)
Sodium: 144 mmol/L (ref 135–145)

## 2022-12-27 LAB — CBC WITH DIFFERENTIAL/PLATELET
Abs Immature Granulocytes: 0.39 10*3/uL — ABNORMAL HIGH (ref 0.00–0.07)
Basophils Absolute: 0 10*3/uL (ref 0.0–0.1)
Basophils Relative: 0 %
Eosinophils Absolute: 0.1 10*3/uL (ref 0.0–0.5)
Eosinophils Relative: 1 %
HCT: 21.7 % — ABNORMAL LOW (ref 39.0–52.0)
Hemoglobin: 7 g/dL — ABNORMAL LOW (ref 13.0–17.0)
Immature Granulocytes: 2 %
Lymphocytes Relative: 5 %
Lymphs Abs: 0.8 10*3/uL (ref 0.7–4.0)
MCH: 25.6 pg — ABNORMAL LOW (ref 26.0–34.0)
MCHC: 32.3 g/dL (ref 30.0–36.0)
MCV: 79.5 fL — ABNORMAL LOW (ref 80.0–100.0)
Monocytes Absolute: 1.2 10*3/uL — ABNORMAL HIGH (ref 0.1–1.0)
Monocytes Relative: 7 %
Neutro Abs: 14.5 10*3/uL — ABNORMAL HIGH (ref 1.7–7.7)
Neutrophils Relative %: 85 %
Platelets: 378 10*3/uL (ref 150–400)
RBC: 2.73 MIL/uL — ABNORMAL LOW (ref 4.22–5.81)
RDW: 19.9 % — ABNORMAL HIGH (ref 11.5–15.5)
WBC: 17 10*3/uL — ABNORMAL HIGH (ref 4.0–10.5)
nRBC: 0.1 % (ref 0.0–0.2)

## 2022-12-27 LAB — GLUCOSE, CAPILLARY
Glucose-Capillary: 108 mg/dL — ABNORMAL HIGH (ref 70–99)
Glucose-Capillary: 109 mg/dL — ABNORMAL HIGH (ref 70–99)

## 2022-12-27 LAB — C-REACTIVE PROTEIN: CRP: 7 mg/dL — ABNORMAL HIGH (ref <1.0)

## 2022-12-27 LAB — CULTURE, BLOOD (SINGLE): Special Requests: ADEQUATE

## 2022-12-27 LAB — MAGNESIUM: Magnesium: 1.8 mg/dL (ref 1.7–2.4)

## 2022-12-27 MED ORDER — SENNA 8.6 MG PO TABS
2.0000 | ORAL_TABLET | Freq: Once | ORAL | Status: AC
  Administered 2022-12-27: 17.2 mg via ORAL
  Filled 2022-12-27: qty 2

## 2022-12-27 MED ORDER — POLYETHYLENE GLYCOL 3350 17 G PO PACK
17.0000 g | PACK | Freq: Every day | ORAL | Status: DC | PRN
  Administered 2022-12-27: 17 g via ORAL
  Filled 2022-12-27: qty 1

## 2022-12-27 NOTE — Progress Notes (Signed)
Mobility Specialist - Progress Note   12/27/22 1211  Mobility  Activity  (Bed Level UE Exercises)  Assistive Device None  Range of Motion/Exercises Right arm;Left arm  Activity Response Tolerated well  Mobility Referral Yes  $Mobility charge 1 Mobility  Mobility Specialist Start Time (ACUTE ONLY) 1155  Mobility Specialist Stop Time (ACUTE ONLY) 1208  Mobility Specialist Time Calculation (min) (ACUTE ONLY) 13 min   Pt received in bed and agreeable to do bed level UE exercises. No complaints during session.   Supine BUE Exercises: 10 reps each,  red & dark green resistance band  1) Elbow Flexion  2) Elbow extension   3) Horizontal Abduction         Chief Technology Officer

## 2022-12-27 NOTE — Progress Notes (Signed)
2 Days Post-Op   Subjective/Chief Complaint:  1 - Neurogenic Bladder - Pt wtih T8 hemiplegia due to invasive multiple myeloma with subsequenst areflexic neurogenic bladder. SPT placed 03/2013 (83F silicone) and changed Q2 Weeks by [facility]. Also irrigates PRN to reduce sediment.    Recent Summarized Surveillance:  04/2022 - Cr 0.74 , Renal US w/o stones or hydro     2 - Fournier's Gangrene - s/p OR debridement 12/25/22 for very early necrotizing infectgion of scrotal area. UCX recently proteus senes gent, rocephen, bactrim. No new WCX data. Placed on Zosyn.   Today Donny is stable. No high grade fevers. WBC stable / early downtrend.    Objective: Vital signs in last 24 hours: Temp:  [97.4 F (36.3 C)-98.2 F (36.8 C)] 97.8 F (36.6 C) (06/05 0522) Pulse Rate:  [81-93] 88 (06/05 0522) Resp:  [16-18] 18 (06/05 0522) BP: (100-115)/(58-67) 100/58 (06/05 0522) SpO2:  [96 %-100 %] 96 % (06/05 0522) Last BM Date : 12/26/22  Intake/Output from previous day: 06/04 0701 - 06/05 0700 In: 1184 [P.O.:240; IV Piggyback:944] Out: 3100 [Urine:3100] Intake/Output this shift: Total I/O In: 594 [IV Piggyback:594] Out: 1975 [Urine:1975]  EXAM: NAD, In good spirits as always, very pleasant. Non-labored breathign on minimal [place] O2 Stable truncal obesity Clean-edged, approx 8x8cm scrotal defect. Testes intact. Wet to dry dressing, just changed, all edges seen. No gross purlulence. SPT with non-foul urine.  Stable LE contracures  c/w paraplegia  Lab Results:  Recent Labs    12/26/22 0542 12/27/22 0519  WBC 18.9* 17.0*  HGB 7.4* 7.0*  HCT 22.8* 21.7*  PLT 385 378   BMET Recent Labs    12/26/22 0542 12/27/22 0519  NA 137 144  K 4.9 3.9  CL 107 110  CO2 23 26  GLUCOSE 197* 98  BUN 10 8  CREATININE 0.81 0.67  CALCIUM 8.6* 8.8*   PT/INR No results for input(s): "LABPROT", "INR" in the last 72 hours. ABG No results for input(s): "PHART", "HCO3" in the last 72  hours.  Invalid input(s): "PCO2", "PO2"  Studies/Results: No results found.  Anti-infectives: Anti-infectives (From admission, onward)    Start     Dose/Rate Route Frequency Ordered Stop   12/25/22 1730  linezolid (ZYVOX) IVPB 600 mg        600 mg 300 mL/hr over 60 Minutes Intravenous 2 times daily 12/25/22 1630     12/25/22 1730  piperacillin-tazobactam (ZOSYN) IVPB 3.375 g        3.375 g 12.5 mL/hr over 240 Minutes Intravenous Every 8 hours 12/25/22 1630     12/25/22 0300  vancomycin (VANCOCIN) IVPB 1000 mg/200 mL premix  Status:  Discontinued        1,000 mg 200 mL/hr over 60 Minutes Intravenous Every 12 hours 12/24/22 1344 12/25/22 1630   12/24/22 1430  vancomycin (VANCOREADY) IVPB 2000 mg/400 mL        2,000 mg 200 mL/hr over 120 Minutes Intravenous  Once 12/24/22 1342 12/24/22 1624   12/23/22 0000  ceFEPIme (MAXIPIME) 2 g in sodium chloride 0.9 % 100 mL IVPB  Status:  Discontinued        2 g 200 mL/hr over 30 Minutes Intravenous Every 8 hours 12/22/22 1501 12/22/22 1559   12/22/22 2200  acyclovir (ZOVIRAX) tablet 400 mg        400 mg Oral 2 times daily 12/22/22 1632     12/22/22 1800  cefTRIAXone (ROCEPHIN) 2 g in sodium chloride 0.9 % 100 mL IVPB  Status:  Discontinued        2 g 200 mL/hr over 30 Minutes Intravenous Every 24 hours 12/22/22 1559 12/25/22 1630   12/22/22 1530  ceFEPIme (MAXIPIME) 2 g in sodium chloride 0.9 % 100 mL IVPB  Status:  Discontinued        2 g 200 mL/hr over 30 Minutes Intravenous  Once 12/22/22 1436 12/22/22 1559       Assessment/Plan:  No indication for futher operative debridement. Discussed that repeat OR for partial wound closure would be recommended as will drastically speed up wound healing and make much easier to pack. Rec OR Friday if possible for this and to verify no further debridemtn needed. Will request.   Loletta Parish. 12/27/2022

## 2022-12-27 NOTE — Progress Notes (Addendum)
[facility] Progress Note  Patient: [name]     UJW:[id number]  DOA: 12/22/2022   PCP: Jethro Bastos, MD       Brief hospital course: This is a 67 year old male with adrenal insufficiency, history of DVT, history of lower GI bleed, paraplegic with no sensation below the abdomen, neurogenic bladder with suprapubic catheter, colostomy, multiple myeloma with metastasis who was sent from the cancer center due to fever, tachycardia, hypotension and a WBC count of 26.7 despite levofloxacin. He was initially admitted to the hospital for a UTI and was started on ceftriaxone.  WBC count was noted to improve slightly. 6/2-noted to have scrotal drainage.  Urology consulted and noted that the patient had an abscess with necrotic borders. 6/3-underwent debridement of necrotic tissue and given a diagnosis of scrotal abscess with foreign years gangrene -Antibiotics transitioned to Zosyn and Zyvox  Subjective:  The patient has no complaints  Assessment and Plan: Principal Problem: UTI and Fournier's gangrene of scrotum-in immunocompromised patient -Status post extensive scrotal debridement and now has exposed testicles - Urology plans to take the patient back to the OR this Friday for partial wound closure - Currently receiving PIP tazobactam and Zyvox -Urine culture reveals Proteus mirabilis which is resistant to ciprofloxacin and nitrofurantoin -Blood culture negative  - Active Problems:    Multiple myeloma in relapse -Originally diagnosed in 2011 - Currently receiving Kyprolis every 2 weeks since June 2023 -Continue acyclovir  Microcytic, iron deficiency anemia - Continue to follow     Paraplegia (HCC)   Neurogenic bladder   Neurogenic bowel -Patient has a suprapubic catheter and a colostomy    History of DVT (deep vein thrombosis) -Status post IVC filter and managed with Xarelto - Oncology recommends indefinite treatment with Xarelto due to the patient's  bedbound status -Xarelto currently on hold-continue Lovenox for DVT prophylaxis  Morbid obesity Body mass index is 36.81 kg/m.       Code Status: Full Code Consultants: Urology Level of Care: Level of care: Med-Surg Total time on patient care: 35 minutes DVT prophylaxis:  Objective:   Vitals:   12/26/22 1320 12/26/22 2131 12/27/22 0522 12/27/22 1322  BP: 115/67 104/64 (!) 100/58 107/66  Pulse: 81 93 88 90  Resp:  16 18 16   Temp: (!) 97.4 F (36.3 C) 98.2 F (36.8 C) 97.8 F (36.6 C) (!) 97.5 F (36.4 C)  TempSrc: Oral Oral Oral Oral  SpO2: 100% 99% 96%   Weight:      Height:       Filed Weights   12/22/22 1500 12/25/22 1258  Weight: 109.8 kg 109.8 kg   Exam: General exam: Appears comfortable  HEENT: oral mucosa moist Respiratory system: Clear to auscultation.  Cardiovascular system: S1 & S2 heard  Gastrointestinal system: Abdomen soft, non-tender, nondistended. Normal bowel sounds   Extremities: No cyanosis, clubbing or edema Psychiatry:  Mood & affect appropriate.      CBC: Recent Labs  Lab 12/23/22 0704 12/24/22 0530 12/25/22 0534 12/26/22 0542 12/27/22 0519  WBC 23.0* 17.2* 11.0* 18.9* 17.0*  NEUTROABS 19.6* 14.0* 8.1* 16.8* 14.5*  HGB 8.0* 8.0* 8.0* 7.4* 7.0*  HCT 24.0* 24.3* 24.5* 22.8* 21.7*  MCV 78.9* 78.6* 77.8* 78.6* 79.5*  PLT 337 365 376 385 378   Basic Metabolic Panel: Recent Labs  Lab 12/23/22 0704 12/24/22 0530 12/25/22 0534 12/26/22 0542 12/27/22 0519  NA 137 136 137 137 144  K 3.3* 3.3* 3.6 4.9 3.9  CL 109 109 110 107 110  CO2 21* 20* 23 23 26   GLUCOSE 87 129* 131* 197* 98  BUN 8 7* 6* 10 8  CREATININE 0.66 0.68 0.69 0.81 0.67  CALCIUM 7.9* 8.1* 8.7* 8.6* 8.8*  MG 1.6* 1.8 1.7 1.7 1.8  PHOS 2.4*  --   --   --   --    GFR: Estimated Creatinine Clearance: 106.3 mL/min (by C-G formula based on SCr of 0.67 mg/dL).  Scheduled Meds:  acyclovir  400 mg Oral BID   atorvastatin  20 mg Oral Daily   cholecalciferol  1,000  Units Oral Daily   dorzolamide-timolol  1 drop Both Eyes BID   enoxaparin (LOVENOX) injection  60 mg Subcutaneous Q24H   feeding supplement (GLUCERNA SHAKE)  237 mL Oral BID BM   iron polysaccharides  150 mg Oral Daily   latanoprost  1 drop Both Eyes QHS   multivitamin with minerals  1 tablet Oral Daily   zinc oxide  1 Application Topical BID   Continuous Infusions:  linezolid (ZYVOX) IV Stopped (12/27/22 0626)   piperacillin-tazobactam (ZOSYN)  IV 3.375 g (12/27/22 1004)   Imaging and lab data was personally reviewed No results found.  LOS: 5 days   Author: Calvert Cantor  12/27/2022 3:34 PM  To contact [facility]>   Check the care team in [facility] and look for the attending/consulting [facility] provider listed  Log into [email or web address] and use [place]'s universal password   Go to> "[facility]"  and find provider  If you still have difficulty reaching the provider, please page the [facility] (Director on Call) for the Hospitalists listed on amion

## 2022-12-28 DIAGNOSIS — N493 Fournier gangrene: Secondary | ICD-10-CM | POA: Diagnosis not present

## 2022-12-28 LAB — BPAM RBC: Unit Type and Rh: 5100

## 2022-12-28 LAB — CBC WITH DIFFERENTIAL/PLATELET
Abs Immature Granulocytes: 0.48 10*3/uL — ABNORMAL HIGH (ref 0.00–0.07)
Basophils Absolute: 0 10*3/uL (ref 0.0–0.1)
Basophils Relative: 0 %
Eosinophils Absolute: 0.4 10*3/uL (ref 0.0–0.5)
Eosinophils Relative: 3 %
HCT: 20 % — ABNORMAL LOW (ref 39.0–52.0)
Hemoglobin: 6.6 g/dL — CL (ref 13.0–17.0)
Immature Granulocytes: 3 %
Lymphocytes Relative: 9 %
Lymphs Abs: 1.3 10*3/uL (ref 0.7–4.0)
MCH: 26 pg (ref 26.0–34.0)
MCHC: 33 g/dL (ref 30.0–36.0)
MCV: 78.7 fL — ABNORMAL LOW (ref 80.0–100.0)
Monocytes Absolute: 1.3 10*3/uL — ABNORMAL HIGH (ref 0.1–1.0)
Monocytes Relative: 9 %
Neutro Abs: 11 10*3/uL — ABNORMAL HIGH (ref 1.7–7.7)
Neutrophils Relative %: 76 %
Platelets: 370 10*3/uL (ref 150–400)
RBC: 2.54 MIL/uL — ABNORMAL LOW (ref 4.22–5.81)
RDW: 19.9 % — ABNORMAL HIGH (ref 11.5–15.5)
WBC: 14.5 10*3/uL — ABNORMAL HIGH (ref 4.0–10.5)
nRBC: 0.3 % — ABNORMAL HIGH (ref 0.0–0.2)

## 2022-12-28 LAB — TYPE AND SCREEN

## 2022-12-28 LAB — GLUCOSE, CAPILLARY: Glucose-Capillary: 143 mg/dL — ABNORMAL HIGH (ref 70–99)

## 2022-12-28 LAB — PREPARE RBC (CROSSMATCH)

## 2022-12-28 MED ORDER — DOXYCYCLINE HYCLATE 100 MG PO TABS
100.0000 mg | ORAL_TABLET | Freq: Two times a day (BID) | ORAL | Status: DC
  Administered 2022-12-28 – 2023-01-08 (×22): 100 mg via ORAL
  Filled 2022-12-28 (×22): qty 1

## 2022-12-28 MED ORDER — AMOXICILLIN-POT CLAVULANATE 875-125 MG PO TABS
1.0000 | ORAL_TABLET | Freq: Two times a day (BID) | ORAL | Status: DC
  Administered 2022-12-28 – 2023-01-08 (×23): 1 via ORAL
  Filled 2022-12-28 (×23): qty 1

## 2022-12-28 MED ORDER — SODIUM CHLORIDE 0.9% IV SOLUTION: Freq: Once | INTRAVENOUS | Status: AC

## 2022-12-28 NOTE — Progress Notes (Signed)
[facility] Progress Note  Patient: [name]     ZOX:[id number]  DOA: 12/22/2022   PCP: Jethro Bastos, MD       Brief hospital course: This is a 67 year old male with adrenal insufficiency, history of DVT, history of lower GI bleed, paraplegic with no sensation below the abdomen, neurogenic bladder with suprapubic catheter, colostomy, multiple myeloma with metastasis who was sent from the cancer center due to fever, tachycardia, hypotension and a WBC count of 26.7 despite levofloxacin. He was initially admitted to the hospital for a UTI and was started on ceftriaxone.  WBC count was noted to improve slightly. 6/2-noted to have scrotal drainage.  Urology consulted and noted that the patient had an abscess with necrotic borders. 6/3-underwent debridement of necrotic tissue and given a diagnosis of scrotal abscess with foreign years gangrene -Antibiotics transitioned to Zosyn and Zyvox  Subjective:  The patient has no complaints today.  Assessment and Plan: Principal Problem: UTI and Fournier's gangrene of scrotum-in immunocompromised patient -Status post extensive scrotal debridement and now with exposed testicles - Urology plans to take the patient back to the OR this Friday for partial wound closure - Currently receiving PIP tazobactam and Zyvox -Urine culture reveals Proteus mirabilis which is resistant to ciprofloxacin and nitrofurantoin- will ask ID for further input on duration of antibiotics -Blood culture negative  - Active Problems:    Multiple myeloma in relapse -Originally diagnosed in 2011 - Currently receiving Kyprolis every 2 weeks since June 2023 -Continue acyclovir  Microcytic, iron deficiency anemia - Continue to follow     Paraplegia (HCC)   Neurogenic bladder   Neurogenic bowel -Patient has a suprapubic catheter and a colostomy    History of DVT (deep vein thrombosis) -Status post IVC filter and managed with Xarelto - Oncology  recommends indefinite treatment with Xarelto due to the patient's bedbound status -Xarelto currently on hold-continue Lovenox for DVT prophylaxis  Morbid obesity Body mass index is 36.81 kg/m.       Code Status: Full Code Consultants: Urology Level of Care: Level of care: Med-Surg Total time on patient care: 35 minutes DVT prophylaxis:  Objective:   Vitals:   12/27/22 1322 12/27/22 2135 12/28/22 0613 12/28/22 1305  BP: 107/66 101/63 110/63 104/81  Pulse: 90 94 90 87  Resp: 16 18 18 20   Temp: (!) 97.5 F (36.4 C) 98.8 F (37.1 C) 98.3 F (36.8 C) 98.2 F (36.8 C)  TempSrc: Oral Oral Oral Oral  SpO2:  97% 98% 100%  Weight:      Height:       Filed Weights   12/22/22 1500 12/25/22 1258  Weight: 109.8 kg 109.8 kg   Exam: General exam: Appears comfortable  HEENT: oral mucosa moist Respiratory system: Clear to auscultation.  Cardiovascular system: S1 & S2 heard  Gastrointestinal system: Abdomen soft, non-tender, nondistended. Normal bowel sounds  GU: extensive debridement of scrotum - wound edges appear clean  Extremities: No cyanosis, clubbing or edema Psychiatry:  Mood & affect appropriate.      CBC: Recent Labs  Lab 12/24/22 0530 12/25/22 0534 12/26/22 0542 12/27/22 0519 12/28/22 0547  WBC 17.2* 11.0* 18.9* 17.0* 14.5*  NEUTROABS 14.0* 8.1* 16.8* 14.5* 11.0*  HGB 8.0* 8.0* 7.4* 7.0* 6.6*  HCT 24.3* 24.5* 22.8* 21.7* 20.0*  MCV 78.6* 77.8* 78.6* 79.5* 78.7*  PLT 365 376 385 378 370    Basic Metabolic Panel: Recent Labs  Lab 12/23/22 0704 12/24/22 0530 12/25/22 0534 12/26/22 0542 12/27/22 8119  NA 137 136 137 137 144  K 3.3* 3.3* 3.6 4.9 3.9  CL 109 109 110 107 110  CO2 21* 20* 23 23 26   GLUCOSE 87 129* 131* 197* 98  BUN 8 7* 6* 10 8  CREATININE 0.66 0.68 0.69 0.81 0.67  CALCIUM 7.9* 8.1* 8.7* 8.6* 8.8*  MG 1.6* 1.8 1.7 1.7 1.8  PHOS 2.4*  --   --   --   --     GFR: Estimated Creatinine Clearance: 106.3 mL/min (by C-G formula based on SCr  of 0.67 mg/dL).  Scheduled Meds:  sodium chloride   Intravenous Once   acyclovir  400 mg Oral BID   atorvastatin  20 mg Oral Daily   cholecalciferol  1,000 Units Oral Daily   dorzolamide-timolol  1 drop Both Eyes BID   enoxaparin (LOVENOX) injection  60 mg Subcutaneous Q24H   feeding supplement (GLUCERNA SHAKE)  237 mL Oral BID BM   iron polysaccharides  150 mg Oral Daily   latanoprost  1 drop Both Eyes QHS   multivitamin with minerals  1 tablet Oral Daily   zinc oxide  1 Application Topical BID   Continuous Infusions:  linezolid (ZYVOX) IV 300 mL/hr at 12/28/22 0614   piperacillin-tazobactam (ZOSYN)  IV 3.375 g (12/28/22 0933)   Imaging and lab data was personally reviewed No results found.  LOS: 6 days   Author: Calvert Cantor  12/28/2022 2:32 PM  To contact [facility]>   Check the care team in [facility] and look for the attending/consulting [facility] provider listed  Log into [email or web address] and use [facility] universal password   Go to> "[facility]"  and find provider  If you still have difficulty reaching the provider, please page the [facility] (Director on Call) for the Hospitalists listed on amion

## 2022-12-28 NOTE — Care Management Important Message (Signed)
Important Message  Patient Details IM Letter given. Name: [name] MRN: [id number] Date of Birth: [birth year]   Medicare Important Message Given:  Yes     [name] 12/28/2022, 10:53 AM

## 2022-12-28 NOTE — Progress Notes (Signed)
Mobility Specialist - Progress Note   12/28/22 1143  Mobility  Activity  (Bed Leve UE Extremities)  Range of Motion/Exercises Right arm;Left arm;Active  Activity Response Tolerated well  Mobility Referral Yes  $Mobility charge 1 Mobility  Mobility Specialist Start Time (ACUTE ONLY) 1129  Mobility Specialist Stop Time (ACUTE ONLY) 1142  Mobility Specialist Time Calculation (min) (ACUTE ONLY) 13 min   Pt received in bed and agreeable to do bed level UE exercises. No complaints during session. Refer below for exercises.  Supine BUE Exercises: 10 reps each,  dark green & red resistance band  1) Elbow Flexion  2) Elbow extension   3) Horizontal Abduction   Chief Technology Officer

## 2022-12-28 NOTE — Progress Notes (Signed)
3 Days Post-Op   Subjective/Chief Complaint:  1 - Neurogenic Bladder - Pt wtih T8 hemiplegia due to invasive multiple myeloma with subsequenst areflexic neurogenic bladder. SPT placed 03/2013 (52F silicone) and changed Q2 Weeks by [facility]. Also irrigates PRN to reduce sediment.   Recent Summarized Surveillance:  04/2022 - Cr 0.74 , Renal US w/o stones or hydro   2 - Fournier's Gangrene - s/p OR debridement 12/25/22 for very early necrotizing infectgion of scrotal area. UCX recently proteus senes gent, rocephen, bactrim. No new WCX data. Placed on Zosyn.   Objective: Vital signs in last 24 hours: Temp:  [97.5 F (36.4 C)-98.8 F (37.1 C)] 98.3 F (36.8 C) (06/06 5409) Pulse Rate:  [90-94] 90 (06/06 0613) Resp:  [16-18] 18 (06/06 0613) BP: (101-110)/(63-66) 110/63 (06/06 0613) SpO2:  [97 %-98 %] 98 % (06/06 0613) Last BM Date : 12/26/22  Intake/Output from previous day: 06/05 0701 - 06/06 0700 In: 1151.2 [P.O.:480; IV Piggyback:671.2] Out: 1100 [Urine:1100] Intake/Output this shift: No intake/output data recorded.  EXAM: NAD, In good spirits as always, very pleasant. Non-labored breathign on minimal [place] O2 Stable truncal obesity Clean-edged, approx 8x8cm scrotal defect. Testes intact. Wet to dry dressing, just changed, all edges seen. No gross purlulence. SPT with non-foul urine.  Stable LE contracures  c/w paraplegia  Lab Results:  Recent Labs    12/27/22 0519 12/28/22 0547  WBC 17.0* 14.5*  HGB 7.0* 6.6*  HCT 21.7* 20.0*  PLT 378 370   BMET Recent Labs    12/26/22 0542 12/27/22 0519  NA 137 144  K 4.9 3.9  CL 107 110  CO2 23 26  GLUCOSE 197* 98  BUN 10 8  CREATININE 0.81 0.67  CALCIUM 8.6* 8.8*   PT/INR No results for input(s): "LABPROT", "INR" in the last 72 hours. ABG No results for input(s): "PHART", "HCO3" in the last 72 hours.  Invalid input(s): "PCO2", "PO2"  Studies/Results: No results found.  Anti-infectives: Anti-infectives (From  admission, onward)    Start     Dose/Rate Route Frequency Ordered Stop   12/25/22 1730  linezolid (ZYVOX) IVPB 600 mg        600 mg 300 mL/hr over 60 Minutes Intravenous 2 times daily 12/25/22 1630     12/25/22 1730  piperacillin-tazobactam (ZOSYN) IVPB 3.375 g        3.375 g 12.5 mL/hr over 240 Minutes Intravenous Every 8 hours 12/25/22 1630     12/25/22 0300  vancomycin (VANCOCIN) IVPB 1000 mg/200 mL premix  Status:  Discontinued        1,000 mg 200 mL/hr over 60 Minutes Intravenous Every 12 hours 12/24/22 1344 12/25/22 1630   12/24/22 1430  vancomycin (VANCOREADY) IVPB 2000 mg/400 mL        2,000 mg 200 mL/hr over 120 Minutes Intravenous  Once 12/24/22 1342 12/24/22 1624   12/23/22 0000  ceFEPIme (MAXIPIME) 2 g in sodium chloride 0.9 % 100 mL IVPB  Status:  Discontinued        2 g 200 mL/hr over 30 Minutes Intravenous Every 8 hours 12/22/22 1501 12/22/22 1559   12/22/22 2200  acyclovir (ZOVIRAX) tablet 400 mg        400 mg Oral 2 times daily 12/22/22 1632     12/22/22 1800  cefTRIAXone (ROCEPHIN) 2 g in sodium chloride 0.9 % 100 mL IVPB  Status:  Discontinued        2 g 200 mL/hr over 30 Minutes Intravenous Every 24 hours 12/22/22 1559  12/25/22 1630   12/22/22 1530  ceFEPIme (MAXIPIME) 2 g in sodium chloride 0.9 % 100 mL IVPB  Status:  Discontinued        2 g 200 mL/hr over 30 Minutes Intravenous  Once 12/22/22 1436 12/22/22 1559       Assessment/Plan:  Wound care performed by myself and student nurse today.  Everything looks good with beefy pink granulation tissue throughout the bed and no necrosis or purulent drainage.  Planning for second look in the OR tomorrow.  No signs of systemic infection, remains on Zosyn.  No culture samples collected from wound.  Interval improvement in leukocytosis  Jamilynn Whitacre W Kaisyn Reinhold 12/28/2022

## 2022-12-29 ENCOUNTER — Inpatient Hospital Stay: Payer: Medicare (Managed Care)

## 2022-12-29 ENCOUNTER — Encounter (HOSPITAL_COMMUNITY): Payer: Self-pay

## 2022-12-29 ENCOUNTER — Inpatient Hospital Stay: Payer: Medicare (Managed Care) | Admitting: Physician Assistant

## 2022-12-29 ENCOUNTER — Encounter (HOSPITAL_COMMUNITY)
Admission: RE | Disposition: A | Discharge status: Home Health | Payer: Self-pay | Source: Ambulatory Visit | Attending: Internal Medicine

## 2022-12-29 ENCOUNTER — Inpatient Hospital Stay (HOSPITAL_COMMUNITY): Payer: Medicare (Managed Care) | Admitting: Anesthesiology

## 2022-12-29 DIAGNOSIS — G4730 Sleep apnea, unspecified: Secondary | ICD-10-CM

## 2022-12-29 DIAGNOSIS — E1149 Type 2 diabetes mellitus with other diabetic neurological complication: Secondary | ICD-10-CM | POA: Diagnosis not present

## 2022-12-29 DIAGNOSIS — N493 Fournier gangrene: Secondary | ICD-10-CM

## 2022-12-29 DIAGNOSIS — N492 Inflammatory disorders of scrotum: Secondary | ICD-10-CM | POA: Diagnosis present

## 2022-12-29 DIAGNOSIS — I82409 Acute embolism and thrombosis of unspecified deep veins of unspecified lower extremity: Secondary | ICD-10-CM

## 2022-12-29 DIAGNOSIS — D649 Anemia, unspecified: Secondary | ICD-10-CM

## 2022-12-29 HISTORY — PX: SCROTAL EXPLORATION: SHX2386

## 2022-12-29 LAB — CBC WITH DIFFERENTIAL/PLATELET
Abs Immature Granulocytes: 0.73 10*3/uL — ABNORMAL HIGH (ref 0.00–0.07)
Basophils Absolute: 0.1 10*3/uL (ref 0.0–0.1)
Basophils Relative: 1 %
Eosinophils Absolute: 0.5 10*3/uL (ref 0.0–0.5)
Eosinophils Relative: 3 %
HCT: 27 % — ABNORMAL LOW (ref 39.0–52.0)
Hemoglobin: 8.9 g/dL — ABNORMAL LOW (ref 13.0–17.0)
Immature Granulocytes: 4 %
Lymphocytes Relative: 7 %
Lymphs Abs: 1.3 10*3/uL (ref 0.7–4.0)
MCH: 27.2 pg (ref 26.0–34.0)
MCHC: 33 g/dL (ref 30.0–36.0)
MCV: 82.6 fL (ref 80.0–100.0)
Monocytes Absolute: 1.2 10*3/uL — ABNORMAL HIGH (ref 0.1–1.0)
Monocytes Relative: 6 %
Neutro Abs: 15.8 10*3/uL — ABNORMAL HIGH (ref 1.7–7.7)
Neutrophils Relative %: 79 %
Platelets: 388 10*3/uL (ref 150–400)
RBC: 3.27 MIL/uL — ABNORMAL LOW (ref 4.22–5.81)
RDW: 20.7 % — ABNORMAL HIGH (ref 11.5–15.5)
WBC: 19.7 10*3/uL — ABNORMAL HIGH (ref 4.0–10.5)
nRBC: 0.2 % (ref 0.0–0.2)

## 2022-12-29 LAB — TYPE AND SCREEN
ABO/RH(D): O POS
Antibody Screen: NEGATIVE
Unit division: 0

## 2022-12-29 LAB — BPAM RBC
Blood Product Expiration Date: 202407092359
ISSUE DATE / TIME: 202406061600

## 2022-12-29 LAB — GLUCOSE, CAPILLARY: Glucose-Capillary: 77 mg/dL (ref 70–99)

## 2022-12-29 SURGERY — EXPLORATION, SCROTUM
Anesthesia: General

## 2022-12-29 MED ORDER — FENTANYL CITRATE (PF) 100 MCG/2ML IJ SOLN
INTRAMUSCULAR | Status: DC | PRN
  Administered 2022-12-29: 100 ug via INTRAVENOUS

## 2022-12-29 MED ORDER — FENTANYL CITRATE (PF) 100 MCG/2ML IJ SOLN
INTRAMUSCULAR | Status: AC
  Filled 2022-12-29: qty 2

## 2022-12-29 MED ORDER — PHENYLEPHRINE HCL (PRESSORS) 10 MG/ML IV SOLN
INTRAVENOUS | Status: DC | PRN
  Administered 2022-12-29 (×5): 160 ug via INTRAVENOUS

## 2022-12-29 MED ORDER — LACTATED RINGERS IV SOLN: INTRAVENOUS | Status: DC

## 2022-12-29 MED ORDER — PROPOFOL 10 MG/ML IV BOLUS
INTRAVENOUS | Status: AC
  Filled 2022-12-29: qty 20

## 2022-12-29 MED ORDER — ONDANSETRON HCL 4 MG/2ML IJ SOLN: 4.0000 mg | Freq: Once | INTRAMUSCULAR | Status: DC | PRN

## 2022-12-29 MED ORDER — MIDAZOLAM HCL 5 MG/5ML IJ SOLN
INTRAMUSCULAR | Status: DC | PRN
  Administered 2022-12-29: 2 mg via INTRAVENOUS

## 2022-12-29 MED ORDER — ONDANSETRON HCL 4 MG/2ML IJ SOLN
INTRAMUSCULAR | Status: DC | PRN
  Administered 2022-12-29: 4 mg via INTRAVENOUS

## 2022-12-29 MED ORDER — CHLORHEXIDINE GLUCONATE 0.12 % MT SOLN
15.0000 mL | Freq: Once | OROMUCOSAL | Status: AC
  Administered 2022-12-29: 15 mL via OROMUCOSAL

## 2022-12-29 MED ORDER — BUPIVACAINE HCL 0.25 % IJ SOLN
INTRAMUSCULAR | Status: AC
  Filled 2022-12-29: qty 1

## 2022-12-29 MED ORDER — SODIUM CHLORIDE 0.9 % IR SOLN: 1000.0000 mL | Freq: Once | Status: DC

## 2022-12-29 MED ORDER — FENTANYL CITRATE PF 50 MCG/ML IJ SOSY: 25.0000 ug | PREFILLED_SYRINGE | INTRAMUSCULAR | Status: DC | PRN

## 2022-12-29 MED ORDER — ACETAMINOPHEN 10 MG/ML IV SOLN: 1000.0000 mg | Freq: Once | INTRAVENOUS | Status: DC | PRN

## 2022-12-29 MED ORDER — DEXAMETHASONE SODIUM PHOSPHATE 10 MG/ML IJ SOLN
INTRAMUSCULAR | Status: DC | PRN
  Administered 2022-12-29: 4 mg via INTRAVENOUS

## 2022-12-29 MED ORDER — AMISULPRIDE (ANTIEMETIC) 5 MG/2ML IV SOLN: 10.0000 mg | Freq: Once | INTRAVENOUS | Status: DC | PRN

## 2022-12-29 MED ORDER — LIDOCAINE HCL (CARDIAC) PF 100 MG/5ML IV SOSY
PREFILLED_SYRINGE | INTRAVENOUS | Status: DC | PRN
  Administered 2022-12-29: 60 mg via INTRAVENOUS

## 2022-12-29 MED ORDER — DAKINS (1/4 STRENGTH) 0.125 % EX SOLN
Freq: Once | CUTANEOUS | Status: DC
  Filled 2022-12-29: qty 473

## 2022-12-29 MED ORDER — MIDAZOLAM HCL 2 MG/2ML IJ SOLN
INTRAMUSCULAR | Status: AC
  Filled 2022-12-29: qty 2

## 2022-12-29 MED ORDER — ORAL CARE MOUTH RINSE: 15.0000 mL | Freq: Once | OROMUCOSAL | Status: AC

## 2022-12-29 MED ORDER — PROPOFOL 10 MG/ML IV BOLUS
INTRAVENOUS | Status: DC | PRN
  Administered 2022-12-29: 200 mg via INTRAVENOUS

## 2022-12-29 SURGICAL SUPPLY — 37 items
ADH SKN CLS APL DERMABOND .7 (GAUZE/BANDAGES/DRESSINGS)
APL SKNCLS STERI-STRIP NONHPOA (GAUZE/BANDAGES/DRESSINGS) ×1
BAG COUNTER SPONGE SURGICOUNT (BAG) IMPLANT
BAG SPNG CNTER NS LX DISP (BAG)
BENZOIN TINCTURE PRP APPL 2/3 (GAUZE/BANDAGES/DRESSINGS) ×2 IMPLANT
BLADE HEX COATED 2.75 (ELECTRODE) ×2 IMPLANT
BLADE SURG 15 STRL LF DISP TIS (BLADE) ×2 IMPLANT
BLADE SURG 15 STRL SS (BLADE) ×1
BNDG GAUZE DERMACEA FLUFF 4 (GAUZE/BANDAGES/DRESSINGS) ×2 IMPLANT
BNDG GZE DERMACEA 4 6PLY (GAUZE/BANDAGES/DRESSINGS) ×2
DERMABOND ADVANCED .7 DNX12 (GAUZE/BANDAGES/DRESSINGS) ×2 IMPLANT
DRAIN PENROSE 0.25X18 (DRAIN) ×2 IMPLANT
DRAIN PENROSE 0.5X18 (DRAIN) ×2 IMPLANT
DRAPE LAPAROTOMY T 98X78 PEDS (DRAPES) ×2 IMPLANT
ELECT REM PT RETURN 15FT ADLT (MISCELLANEOUS) ×2 IMPLANT
GAUZE PAD ABD 7.5X8 STRL (GAUZE/BANDAGES/DRESSINGS) IMPLANT
GAUZE SPONGE 4X4 12PLY STRL (GAUZE/BANDAGES/DRESSINGS) ×2 IMPLANT
GLOVE SURG LX STRL 7.5 STRW (GLOVE) ×2 IMPLANT
GOWN SRG XL LVL 4 BRTHBL STRL (GOWNS) ×2 IMPLANT
GOWN STRL NON-REIN XL LVL4 (GOWNS) ×1
KIT BASIN OR (CUSTOM PROCEDURE TRAY) ×2 IMPLANT
KIT TURNOVER KIT A (KITS) IMPLANT
NDL HYPO 22X1.5 SAFETY MO (MISCELLANEOUS) IMPLANT
NEEDLE HYPO 22X1.5 SAFETY MO (MISCELLANEOUS) IMPLANT
NS IRRIG 1000ML POUR BTL (IV SOLUTION) IMPLANT
PACK BASIC VI WITH GOWN DISP (CUSTOM PROCEDURE TRAY) ×2 IMPLANT
PENCIL SMOKE EVACUATOR (MISCELLANEOUS) IMPLANT
SPONGE T-LAP 4X18 ~~LOC~~+RFID (SPONGE) ×4 IMPLANT
SUPPORT SCROTAL LG STRP (MISCELLANEOUS) ×2 IMPLANT
SUT MNCRL AB 4-0 PS2 18 (SUTURE) ×2 IMPLANT
SUT SILK 0 (SUTURE)
SUT SILK 0 30XBRD TIE 6 (SUTURE) ×2 IMPLANT
SUT VIC AB 3-0 SH 27 (SUTURE) ×1
SUT VIC AB 3-0 SH 27XBRD (SUTURE) ×2 IMPLANT
SYR 20ML LL LF (SYRINGE) ×2 IMPLANT
SYR CONTROL 10ML LL (SYRINGE) IMPLANT
WATER STERILE IRR 1000ML POUR (IV SOLUTION) IMPLANT

## 2022-12-29 NOTE — Progress Notes (Signed)
Mobility Specialist - Progress Note   12/29/22 1146  Mobility  Activity  (Bed Level UE Exercises)  Range of Motion/Exercises Right arm;Left arm;Active  Activity Response Tolerated well  Mobility Referral Yes  $Mobility charge 1 Mobility  Mobility Specialist Start Time (ACUTE ONLY) 1131  Mobility Specialist Stop Time (ACUTE ONLY) 1143  Mobility Specialist Time Calculation (min) (ACUTE ONLY) 12 min   Pt received in bed and agreeable do UE exercises. No complaints during session. See below for exercises.   Supine BUE Exercises: 15 reps each   1) Elbow Flexion  2) Elbow extension   3) Horizontal Abduction              4) Stress Ball Squeezes  [employer]

## 2022-12-29 NOTE — TOC Progression Note (Signed)
Transition of Care [facility]) - Progression Note    Patient Details  Name: [name] MRN: [id number] Date of Birth: May 08, 1956  Transition of Care [facility]) CM/SW Contact  Beckie Busing, RN Phone Number:[phone]  12/29/2022, 3:38 PM  Clinical Narrative:    Update given to [place] CSW at [facility].    Expected Discharge Plan: Home w Home Health Services Barriers to Discharge: Continued Medical Work up  Expected Discharge Plan and Services In-house Referral: NA Discharge Planning Services: CM Consult Post Acute Care Choice: NA                   DME Arranged: N/A DME Agency: NA       HH Arranged: PCS/Personal Care Services (all services contracted through [facility])           Social Determinants of Health (SDOH) Interventions SDOH Screenings   Food Insecurity: No Food Insecurity (12/22/2022)  Housing: Low Risk  (12/22/2022)  Transportation Needs: No Transportation Needs (12/22/2022)  Utilities: Not At Risk (12/22/2022)  Tobacco Use: Low Risk  (12/29/2022)    Readmission Risk Interventions    12/26/2022    3:45 PM 12/26/2022    3:25 PM  Readmission Risk Prevention Plan  Post Dischage Appt Complete Complete  Medication Screening Complete Complete  Transportation Screening Complete Complete

## 2022-12-29 NOTE — Progress Notes (Signed)
Subjective/Chief Complaint:  1 - Neurogenic Bladder - Pt wtih T8 hemiplegia due to invasive multiple myeloma with subsequenst areflexic neurogenic bladder. SPT placed 03/2013 (70F silicone) and changed Q2 Weeks by [facility]. Also irrigates PRN to reduce sediment.   Recent Summarized Surveillance:  04/2022 - Cr 0.74 , Renal US w/o stones or hydro    2 - Fournier's Gangrene - s/p OR debridement 12/25/22 for very early necrotizing infectgion of scrotal area. UCX recently proteus senes gent, rocephen, bactrim. No new WCX data. Placed on Zosyn.   Today Osama is stable. Some RBC yesterday for slow drift anemia. WBC continues downtrend. Planning for 2nd stage debridement and hopefully partial closure today.    Objective: Vital signs in last 24 hours: Temp:  [98.2 F (36.8 C)-99.3 F (37.4 C)] 98.6 F (37 C) (06/07 0513) Pulse Rate:  [64-94] 64 (06/07 0513) Resp:  [16-20] 18 (06/07 0513) BP: (98-118)/(65-81) 118/75 (06/07 0513) SpO2:  [93 %-100 %] 100 % (06/07 0513) Last BM Date : 12/28/22  Intake/Output from previous day: 06/06 0701 - 06/07 0700 In: 686.1 [P.O.:240; I.V.:16; Blood:356.3; IV Piggyback:73.8] Out: 2175 [Urine:975; Stool:1200] Intake/Output this shift: No intake/output data recorded.  EXAM: NAD, In good spirits as always, very pleasant. Non-labored breathing Stable truncal obesity Clean-edged, approx 8x8cm scrotal defect. Testes intact. Wet to dry dressing, just changed, all edges seen. No gross purlulence. SPT with non-foul urine.  Stable LE contracures  c/w paraplegia  Lab Results:  Recent Labs    12/27/22 0519 12/28/22 0547  WBC 17.0* 14.5*  HGB 7.0* 6.6*  HCT 21.7* 20.0*  PLT 378 370   BMET Recent Labs    12/27/22 0519  NA 144  K 3.9  CL 110  CO2 26  GLUCOSE 98  BUN 8  CREATININE 0.67  CALCIUM 8.8*   PT/INR No results for input(s): "LABPROT", "INR" in the last 72 hours. ABG No results for input(s): "PHART", "HCO3" in the last 72  hours.  Invalid input(s): "PCO2", "PO2"  Studies/Results: No results found.  Anti-infectives: Anti-infectives (From admission, onward)    Start     Dose/Rate Route Frequency Ordered Stop   12/28/22 1800  doxycycline (VIBRA-TABS) tablet 100 mg        100 mg Oral Every 12 hours 12/28/22 1456     12/28/22 1545  amoxicillin-clavulanate (AUGMENTIN) 875-125 MG per tablet 1 tablet        1 tablet Oral Every 12 hours 12/28/22 1456     12/25/22 1730  linezolid (ZYVOX) IVPB 600 mg  Status:  Discontinued        600 mg 300 mL/hr over 60 Minutes Intravenous 2 times daily 12/25/22 1630 12/28/22 1456   12/25/22 1730  piperacillin-tazobactam (ZOSYN) IVPB 3.375 g  Status:  Discontinued        3.375 g 12.5 mL/hr over 240 Minutes Intravenous Every 8 hours 12/25/22 1630 12/28/22 1456   12/25/22 0300  vancomycin (VANCOCIN) IVPB 1000 mg/200 mL premix  Status:  Discontinued        1,000 mg 200 mL/hr over 60 Minutes Intravenous Every 12 hours 12/24/22 1344 12/25/22 1630   12/24/22 1430  vancomycin (VANCOREADY) IVPB 2000 mg/400 mL        2,000 mg 200 mL/hr over 120 Minutes Intravenous  Once 12/24/22 1342 12/24/22 1624   12/23/22 0000  ceFEPIme (MAXIPIME) 2 g in sodium chloride 0.9 % 100 mL IVPB  Status:  Discontinued        2 g 200 mL/hr over  30 Minutes Intravenous Every 8 hours 12/22/22 1501 12/22/22 1559   12/22/22 2200  acyclovir (ZOVIRAX) tablet 400 mg        400 mg Oral 2 times daily 12/22/22 1632     12/22/22 1800  cefTRIAXone (ROCEPHIN) 2 g in sodium chloride 0.9 % 100 mL IVPB  Status:  Discontinued        2 g 200 mL/hr over 30 Minutes Intravenous Every 24 hours 12/22/22 1559 12/25/22 1630   12/22/22 1530  ceFEPIme (MAXIPIME) 2 g in sodium chloride 0.9 % 100 mL IVPB  Status:  Discontinued        2 g 200 mL/hr over 30 Minutes Intravenous  Once 12/22/22 1436 12/22/22 1559       Assessment/Plan:  Proceed as planned with 2nd stage debridement / wound closure of large perineal-scrotal wound.  Risks, benefits, alternatives, expected peri-op course, need for possible additional staged procedures and prolonged wound care discussed in detail. He has very good understanding and commendable attitude.   Loletta Parish. 12/29/2022

## 2022-12-29 NOTE — Op Note (Signed)
NAME: [name], [name]. MEDICAL RECORD NO: [id number] ACCOUNT NO: [id number] DATE OF BIRTH: 08-04-1955 FACILITY: [facility] LOCATION: [id number] PHYSICIAN: Sebastian Ache, MD  Operative Report   DATE OF PROCEDURE: 12/29/2022  PREOPERATIVE DIAGNOSES:  Severe scrotal abscess with large scrotal wound.  POSTOPERATIVE DIAGNOSES:  Severe scrotal abscess with large scrotal wound.  PROCEDURE: 1.  Second stage scrotal wound debridement. 2.  Testicular transposition (thigh pouches bilaterally).  SPECIMEN:  None.  FINDINGS: 1.  Approximately 10 x 10 cm large scrotal wound with testes completely exposed. 2.  No evidence of residual purulence or active infection. 3.  Successful transposition to the testes to the inner thigh via thigh pouches. 4.  Excellent closure of the scrotum still allowing packing.  INDICATIONS:  The patient is a very pleasant unfortunate 67 year old man with history of paraplegia secondary to recent myeloma.  He has bowel managed with colostomy and bladder managed with chronic suprapubic tube.  He is insensate below his abdomen.   He was found on workup of sepsis to have a urinary tract infection, but also a significant perineal abscess concerning for very early necrotizing fasciitis.  He underwent expeditious debridement in very thorough fashion several days ago by my partners  and he has since significantly improved clinically with decreased fevers and leukocytosis.  He does have a very large complex wound that on clinical exam at the bedside appeared to be clean based and viable at the edges.  Options were discussed including  recommended path of repeat debridement and washout with possible partial wound closure and he wished to proceed.  Informed consent was obtained and placed in medical record.  PROCEDURE IN DETAIL:  The patient was also [name] verified and procedure being second stage perineal wound washout and closure was confirmed.  Procedure timeout was performed.   Intravenous antibiotics were verified.  General LMA anesthesia  induced.  The patient was placed into a medium lithotomy position.  Sterile field was created, prepping and draping the patient's penis, perineum, and proximal thighs up to the suprapubic tube and after his in situ dressing was removed, it was performed  using iodine.  Careful inspection of the wound circumferentially revealed excellent viability of all wound edges.  The testes were bilaterally completely exposed and without coverage whatsoever.  Digital exam revealed the wound did not appear to  communicate with any intraabdominal cavity towards the rectal stump.  It did appear that the wound was of sufficient cleanliness to consider reconstruction and partial closure. Given his significant scrotal and perineal skin loss and his testes did  completely exposed, I felt that testicular transposition with bilateral thigh pouches would allow for the most simple and prudent means of closure.  As such, the testes were bilaterally mobilized onto the cord, which was circumferentially dissected up to  the external ring and the cord was quite vigorous on both sides using excellent continued vascularity. Subcutaneous pouch was then made via this incision laterally in the deep subcutaneous with muscle and a pocket approximately 5 x 5 cm was created  bilaterally.  The pocket was carefully inspected and was completely hemostatic after point coagulation current was applied and bilateral testes were transposed into these inner leaflet type pouches.  This revealed excellent testicular closure,  nontethering or torsion of the cord and now much simpler wound closure.  The tunneled to the pouches was loosely reapproximated using interrupted 2-0 Vicryl. Different geometries for wound closure were entertained.  I used a simple semicircular closure  bringing the anterior and  posterior aspects together, provided a most optimal cosmesis.  The wound was very carefully  irrigated copiously using Pulsavac and purposely loose and wide.  Interrupted suture x5 were used to reapproximate the anterior and  posterior scrotum.  This provided complete full thickness closure of the area. Purposely, a gap of approximately two inches was left on each side between the sutures to allow packing and approximately 8 inches of Dakin-soaked Kerlix was packed into each  side, leaving a copious tag, approximately 4 inches in length exiting.  Dressing of ABD type pad was placed.  Procedure was terminated.  The patient tolerated procedure well, no immediate perioperative complications.  The patient taken to postanesthesia  care unit in stable condition.  Plan for continued inpatient admission.   NIK D: 12/29/2022 6:05:32 pm T: 12/29/2022 10:38:00 pm  JOB: [id number]/ [id number]

## 2022-12-29 NOTE — Brief Op Note (Signed)
12/22/2022 - 12/29/2022  5:58 PM  PATIENT:  [name]  67 y.o. male  PRE-OPERATIVE DIAGNOSIS:  scrotal debridement  POST-OPERATIVE DIAGNOSIS:  scrotal debridement  PROCEDURE:  Procedure(s): SCROTAL WOUND DEBRIDEMENT AND CLOSURE (N/A)  SURGEON:  Surgeon(s) and Role:    * Aspasia Rude, Delbert Phenix., MD - Primary  PHYSICIAN ASSISTANT:   ASSISTANTS: none   ANESTHESIA:   general  EBL:  20mL   BLOOD ADMINISTERED:none  DRAINS:  SPT to gravity    LOCAL MEDICATIONS USED:  NONE  SPECIMEN:  No Specimen  DISPOSITION OF SPECIMEN:  N/A  COUNTS:  YES  TOURNIQUET:  * No tourniquets in log *  DICTATION: .Other Dictation: Dictation Number [id number]  PLAN OF CARE: Admit to inpatient   PATIENT DISPOSITION:  PACU - hemodynamically stable.   Delay start of Pharmacological VTE agent (>24hrs) due to surgical blood loss or risk of bleeding: not applicable

## 2022-12-29 NOTE — Anesthesia Procedure Notes (Signed)
Procedure Name: LMA Insertion Date/Time: 12/29/2022 5:07 PM  Performed by: Shanon Payor, CRNAPre-anesthesia Checklist: Patient identified, Emergency Drugs available, Suction available, Patient being monitored and Timeout performed Patient Re-evaluated:Patient Re-evaluated prior to induction Oxygen Delivery Method: Circle system utilized Preoxygenation: Pre-oxygenation with 100% oxygen Induction Type: IV induction LMA: LMA with gastric port inserted LMA Size: 5.0 Number of attempts: 1 Placement Confirmation: positive ETCO2, CO2 detector and breath sounds checked- equal and bilateral Tube secured with: Tape Dental Injury: Teeth and Oropharynx as per pre-operative assessment

## 2022-12-29 NOTE — Progress Notes (Signed)
[facility] Progress Note  Patient: [name]     WGN:[id number]  DOA: 12/22/2022   PCP: Jethro Bastos, MD       Brief hospital course: This is a 67 year old male with adrenal insufficiency, history of DVT, history of lower GI bleed, paraplegic with no sensation below the abdomen, neurogenic bladder with suprapubic catheter, colostomy, multiple myeloma with metastasis who was sent from the cancer center due to fever, tachycardia, hypotension and a WBC count of 26.7 despite levofloxacin. He was initially admitted to the hospital for a UTI and was started on ceftriaxone.  WBC count was noted to improve slightly. 6/2-noted to have scrotal drainage.  Urology consulted and noted that the patient had an abscess with necrotic borders. 6/3-underwent debridement of necrotic tissue and given a diagnosis of scrotal abscess with foreign years gangrene -Antibiotics transitioned to Zosyn and Zyvox  Subjective:  No complaints.  Assessment and Plan: Principal Problem: UTI and Fournier's gangrene of scrotum-in immunocompromised patient -Status post extensive scrotal debridement and now with exposed testicles - Has been receiving PIP tazobactam and Zyvox - Urine culture reveals Proteus mirabilis which is resistant to ciprofloxacin and nitrofurantoin -Blood culture negative - ID recommended Doxy and Augmentin until 6/16- appreciate input - partial wound closure being performed today  - Active Problems:    Multiple myeloma in relapse -Originally diagnosed in 2011 - Currently receiving Kyprolis every 2 weeks since June 2023 -Continue acyclovir  Microcytic, iron deficiency anemia - Continue to follow     T8 Paraplegia (HCC)   Neurogenic bladder   Neurogenic bowel -Patient has a suprapubic catheter and a colostomy    History of DVT (deep vein thrombosis) -Status post IVC filter and managed with Xarelto - Oncology recommends indefinite treatment with Xarelto due to the  patient's bedbound status -Xarelto currently on hold-continue Lovenox for DVT prophylaxis  Morbid obesity Body mass index is 36.81 kg/m.       Code Status: Full Code Consultants: Urology Level of Care: Level of care: Med-Surg Total time on patient care: 35 minutes DVT prophylaxis:  Objective:   Vitals:   12/29/22 0513 12/29/22 1340 12/29/22 1429 12/29/22 1455  BP: 118/75 115/76  130/76  Pulse: 64 88  76  Resp: 18 18    Temp: 98.6 F (37 C) 99 F (37.2 C)  98.3 F (36.8 C)  TempSrc: Oral Oral  Oral  SpO2: 100%   99%  Weight:   109.8 kg   Height:   5\' 8"  (1.727 m)    Filed Weights   12/22/22 1500 12/25/22 1258 12/29/22 1429  Weight: 109.8 kg 109.8 kg 109.8 kg   Exam: General exam: Appears comfortable  HEENT: oral mucosa moist Respiratory system: Clear to auscultation.  Cardiovascular system: S1 & S2 heard  Gastrointestinal system: Abdomen soft, non-tender, nondistended. Normal bowel sounds  GU: extensive debridement of scrotum - wound edges appear clean  Extremities: No cyanosis, clubbing or edema Psychiatry:  Mood & affect appropriate.      CBC: Recent Labs  Lab 12/25/22 0534 12/26/22 0542 12/27/22 0519 12/28/22 0547 12/29/22 0608  WBC 11.0* 18.9* 17.0* 14.5* 19.7*  NEUTROABS 8.1* 16.8* 14.5* 11.0* 15.8*  HGB 8.0* 7.4* 7.0* 6.6* 8.9*  HCT 24.5* 22.8* 21.7* 20.0* 27.0*  MCV 77.8* 78.6* 79.5* 78.7* 82.6  PLT 376 385 378 370 388    Basic Metabolic Panel: Recent Labs  Lab 12/23/22 0704 12/24/22 0530 12/25/22 0534 12/26/22 0542 12/27/22 0519  NA 137 136 137 137 144  K 3.3* 3.3* 3.6 4.9 3.9  CL 109 109 110 107 110  CO2 21* 20* 23 23 26   GLUCOSE 87 129* 131* 197* 98  BUN 8 7* 6* 10 8  CREATININE 0.66 0.68 0.69 0.81 0.67  CALCIUM 7.9* 8.1* 8.7* 8.6* 8.8*  MG 1.6* 1.8 1.7 1.7 1.8  PHOS 2.4*  --   --   --   --     GFR: Estimated Creatinine Clearance: 106.3 mL/min (by C-G formula based on SCr of 0.67 mg/dL).  Scheduled Meds:  [MAR Hold]  acyclovir  400 mg Oral BID   [MAR Hold] amoxicillin-clavulanate  1 tablet Oral Q12H   [MAR Hold] atorvastatin  20 mg Oral Daily   [MAR Hold] cholecalciferol  1,000 Units Oral Daily   [MAR Hold] dorzolamide-timolol  1 drop Both Eyes BID   [MAR Hold] doxycycline  100 mg Oral Q12H   [MAR Hold] feeding supplement (GLUCERNA SHAKE)  237 mL Oral BID BM   [MAR Hold] iron polysaccharides  150 mg Oral Daily   [MAR Hold] latanoprost  1 drop Both Eyes QHS   [MAR Hold] multivitamin with minerals  1 tablet Oral Daily   [MAR Hold] zinc oxide  1 Application Topical BID   Continuous Infusions:  lactated ringers 50 mL/hr at 12/29/22 1511   Imaging and lab data was personally reviewed No results found.  LOS: 7 days   Author: Calvert Cantor  12/29/2022 4:06 PM  To contact [facility]>   Check the care team in [facility] and look for the attending/consulting [facility] provider listed  Log into [email or web address] and use [place]'s universal password   Go to> "[facility]"  and find provider  If you still have difficulty reaching the provider, please page the [facility] (Director on Call) for the Hospitalists listed on amion

## 2022-12-29 NOTE — Consult Note (Addendum)
[facility]    Date of Admission:  12/22/2022     Reason for Consult: cellulitis    Referring Provider: Butler Denmark     Lines:  Old colostomy llq Suprapubic catheter old   Peripheral iv's  Abx: 6/6-c doxy 6/6-0 amox-clav  6/3-6/6 piptazo 6/3-6/6 linezolid 5/31-6/3 vanc 5/31-6/3 ceftriaxone       Outpatiett acyclovir  Assessment: 67 yo male with iatrogenic ai (chronic corticosteroid use), hx dvt s/p ivc and had had xarelto, hx lower gib, T8 paraplegia due to MM, neurogenic bladder with spc, mm metastatic to bone not in remission, s/p colostomy, admitted from cancer center on 5/31 with sepsis (was on levoflox 3 days or nonspecific febrile illness by cancer center), found to have nec-fasc perineum  S/p I&D 6/3 by urology for fournier's gangrene. Finding "no discrete pockets of purulent material; testicles not affected and tunica intact; no perirectal involvement"  Bcx negative in setting outpatient levoflox No operative soft tissue cx Ucx proteus mirabilis  ----- 6/7 assessment  Wbc trend up and down in setting acute on chronic anemia Chart hx AI but he is not on any corticosteroid this admission -- his vitals signs are normal currently and no sign on chemistry to suggest adrenal crisis. So do not think leucocytosis is due to AI  Urology review of wound bed --> no gross purulence; clean edged; beefy pink granulation tissue throughout bed and no necrosis  Planned repeat OR exam today of the wound     Plan: Clinically fournier's gangrene is source controlled on 6/3 and improving Changed to oral doxy/amox-clav and plan 10 more days of treatment until 6/16 Other supportive care per primary team Will see how he does over the weekend and if continued improvement will plan dc with close ID f/u in 1-2 weeks ID clinic arranged at this time for 6/19 @ 345 with Marcos Eke Discussed with primary team       ------------------------------------------------ Principal Problem:   Fournier's gangrene of scrotum Active Problems:   Paraplegia (HCC)   Neurogenic bladder   Multiple myeloma in relapse (HCC)   Neurogenic bowel   Iron deficiency anemia   Hypoalbuminemia   History of DVT (deep vein thrombosis)   Type 2 diabetes mellitus with obesity (HCC)   OSA (obstructive sleep apnea)   Glaucoma    HPI: [name] is a 67 y.o. male  with iatrogenic ai (chronic corticosteroid use), hx dvt s/p ivc and had had xarelto, hx lower gib, T8 paraplegia due to MM, neurogenic bladder with spc, mm metastatic to bone not in remission, s/p colostomy, admitted from cancer center on 5/31 with sepsis (was on levoflox 3 days or nonspecific febrile illness by cancer center), found to have nec-fasc perineum  Patient is insensate and not awared of any gu process On admission initial abx for presumed uti given urine cx growing proteus Bcx negative but has had 3 days levoflox as he had nonspecific febrile illness and given abx by cancer center  He did have covid a few weeks before this and query about lingering effect of that in regard to recent malaise/fever prior to starting levoflox  On 6/3 an ultrasound was obtained for concerned of scrotal abscess and ultimately fournier's gangrene was dx'ed  He underwent I&D by urology on 6/3. I reviewed operative note.  He has had undulating leukocytosis trend but clinically doing well otherwise with healing wound  He'll get revisit in or today for  wound exam   Overall he is feeling subjectively much better in terms of just "sick" feeling  Family History  Problem Relation Age of Onset   Ovarian cancer Mother    Diabetes Father     Social History   Tobacco Use   Smoking status: Never   Smokeless tobacco: Never  Vaping Use   Vaping Use: Never used  Substance Use Topics   Alcohol use: No   Drug use: No    Allergies  Allergen Reactions    Ferumoxytol Nausea And Vomiting and Other (See Comments)    The patient felt like he was going to pass out and had chills also     Review of Systems: ROS All Other ROS was negative, except mentioned above   Past Medical History:  Diagnosis Date   Adrenal insufficiency (HCC)    on chronic dexamethasone   Anemia    Cancer (HCC)    Coagulopathy (HCC)    on xeralto/ s/p DVT while on coumadin,  IVC in place   Diabetes mellitus without complication (HCC)    type 2   Glaucoma 12/22/2022   Gross hematuria 01/2013   post foley cath procedure   History of blood transfusion 01/2013   Lower GI bleeding 04/18/2017   Multiple myeloma    thoracic T8 with paraplegia s/p resection- on chemo at visit 10/13/10   Multiple myeloma    Multiple myeloma without mention of remission    Neurogenic bladder    Neurogenic bowel    OSA (obstructive sleep apnea) 11/01/2022   Paraplegia (HCC)    Partial small bowel obstruction (HCC) during dec 2011 admission       Scheduled Meds:  acyclovir  400 mg Oral BID   amoxicillin-clavulanate  1 tablet Oral Q12H   atorvastatin  20 mg Oral Daily   cholecalciferol  1,000 Units Oral Daily   dorzolamide-timolol  1 drop Both Eyes BID   doxycycline  100 mg Oral Q12H   feeding supplement (GLUCERNA SHAKE)  237 mL Oral BID BM   iron polysaccharides  150 mg Oral Daily   latanoprost  1 drop Both Eyes QHS   multivitamin with minerals  1 tablet Oral Daily   zinc oxide  1 Application Topical BID   Continuous Infusions: PRN Meds:.acetaminophen **OR** acetaminophen, diazepam, hydrocerin, ondansetron **OR** ondansetron (ZOFRAN) IV, polyethylene glycol   OBJECTIVE: Blood pressure 118/75, pulse 64, temperature 98.6 F (37 C), temperature source Oral, resp. rate 18, height 5\' 8"  (1.727 m), weight 109.8 kg, SpO2 100 %.  Physical Exam  General/constitutional: no distress, pleasant HEENT: Normocephalic, PER, Conj Clear, EOMI, Oropharynx clear Neck supple CV: rrr no  mrg Lungs: clear to auscultation, normal respiratory effort Abd: Soft, Nontender -- colostomy functioning Ext: no edema Skin: No Rash Neuro: paraplegia MSK: no peripheral joint swelling/tenderness/warmth Gu -- scrotal edema; spc in place     Lab Results Lab Results  Component Value Date   WBC 19.7 (H) 12/29/2022   HGB 8.9 (L) 12/29/2022   HCT 27.0 (L) 12/29/2022   MCV 82.6 12/29/2022   PLT 388 12/29/2022    Lab Results  Component Value Date   CREATININE 0.67 12/27/2022   BUN 8 12/27/2022   NA 144 12/27/2022   K 3.9 12/27/2022   CL 110 12/27/2022   CO2 26 12/27/2022    Lab Results  Component Value Date   ALT 30 12/23/2022   AST 37 12/23/2022   ALKPHOS 84 12/23/2022   BILITOT 0.8 12/23/2022  Microbiology: Recent Results (from the past 240 hour(s))  Culture, Blood     Status: None   Collection Time: 12/22/22 10:56 AM   Specimen: BLOOD LEFT ARM  Result Value Ref Range Status   Specimen Description BLOOD LEFT ARM  Final   Special Requests   Final    BOTTLES DRAWN AEROBIC AND ANAEROBIC Blood Culture adequate volume   Culture   Final    NO GROWTH 5 DAYS Performed at [facility], [address], [place], [place] [address]    Report Status 12/27/2022 FINAL  Final  Urine Culture     Status: Abnormal   Collection Time: 12/22/22  2:40 PM   Specimen: Urine, Clean Catch  Result Value Ref Range Status   Specimen Description   Final    URINE, CLEAN CATCH Performed at [facility], [address] [place], [place] [address]    Special Requests   Final    NONE Performed at [facility], [address], [place], [place] [address]    Culture >=100,000 COLONIES/mL PROTEUS MIRABILIS (A)  Final   Report Status 12/24/2022 FINAL  Final   Organism ID, Bacteria PROTEUS MIRABILIS (A)  Final      Susceptibility   Proteus mirabilis - MIC*    AMPICILLIN 8 SENSITIVE Sensitive     CEFAZOLIN <=4 SENSITIVE Sensitive      CEFEPIME <=0.12 SENSITIVE Sensitive     CEFTRIAXONE <=0.25 SENSITIVE Sensitive     CIPROFLOXACIN >=4 RESISTANT Resistant     GENTAMICIN <=1 SENSITIVE Sensitive     IMIPENEM 1 SENSITIVE Sensitive     NITROFURANTOIN RESISTANT Resistant     TRIMETH/SULFA <=20 SENSITIVE Sensitive     AMPICILLIN/SULBACTAM <=2 SENSITIVE Sensitive     * >=100,000 COLONIES/mL PROTEUS MIRABILIS     Serology:    Imaging: If present, new imagings (plain films, ct scans, and mri) have been personally visualized and interpreted; radiology reports have been reviewed. Decision making incorporated into the Impression / Recommendations.  6/2 u/s scrotum with doppler There is no evidence of testicular torsion. The slightly inhomogeneous echogenicity in both testes without discrete measurable focal lesions.   There is 2 x 2.1 x 2.6 cm hyperechoic area adjacent to the inferior margin of the right testis. There is 2.3 x 2.9 x 1.9 cm mixed echogenic area inferior to the left testis. Significance of this finding is not clear. This may suggest chronic inflammatory process or unusual neoplastic process. Urology consultation and short-term follow-up scrotal sonogram may be considered.   Moderate bilateral hydronephrosis.    5/28 outpatient cxr no acute cardiopulm pathology  Raymondo Band, MD [facility] [phone] pager    12/29/2022, 10:36 AM

## 2022-12-29 NOTE — Anesthesia Preprocedure Evaluation (Addendum)
Anesthesia Evaluation  Patient identified by MRN, date of birth, ID band Patient awake    Reviewed: Allergy & Precautions, NPO status , Patient's Chart, lab work & pertinent test results  Airway Mallampati: III  TM Distance: >3 FB Neck ROM: Full    Dental  (+) Missing   Pulmonary sleep apnea    Pulmonary exam normal        Cardiovascular + DVT  Normal cardiovascular exam     Neuro/Psych Paraplegia  Neuromuscular disease  negative psych ROS   GI/Hepatic negative GI ROS, Neg liver ROS,,,  Endo/Other  diabetes, Well Controlled  Multiple myeloma Adrenal insufficiency  Renal/GU negative Renal ROS   Neurogenic bladder    Musculoskeletal negative musculoskeletal ROS (+)    Abdominal  (+) + obese  Peds  Hematology  (+) Blood dyscrasia, anemia   Anesthesia Other Findings scrotal debridement  Reproductive/Obstetrics                             Anesthesia Physical Anesthesia Plan  ASA: 3  Anesthesia Plan: General   Post-op Pain Management:    Induction: Intravenous  PONV Risk Score and Plan: 2 and Ondansetron, Dexamethasone and Treatment may vary due to age or medical condition  Airway Management Planned: LMA  Additional Equipment:   Intra-op Plan:   Post-operative Plan: Extubation in OR  Informed Consent: I have reviewed the patients History and Physical, chart, labs and discussed the procedure including the risks, benefits and alternatives for the proposed anesthesia with the patient or authorized representative who has indicated his/her understanding and acceptance.     Dental advisory given  Plan Discussed with: CRNA  Anesthesia Plan Comments:         Anesthesia Quick Evaluation

## 2022-12-29 NOTE — Transfer of Care (Signed)
Immediate Anesthesia Transfer of Care Note  Patient: [name]  Procedure(s) Performed: Procedure(s): SCROTAL WOUND DEBRIDEMENT AND CLOSURE (N/A)  Patient Location: PACU  Anesthesia Type:General  Level of Consciousness: Alert, Awake, Oriented  Airway & Oxygen Therapy: Patient Spontanous Breathing  Post-op Assessment: Report given to RN  Post vital signs: Reviewed and stable  Last Vitals:  Vitals:   12/29/22 1340 12/29/22 1455  BP: 115/76 130/76  Pulse: 88 76  Resp: 18   Temp: 37.2 C 36.8 C  SpO2:  99%    Complications: No apparent anesthesia complications

## 2022-12-30 ENCOUNTER — Encounter (HOSPITAL_COMMUNITY): Payer: Self-pay | Admitting: Urology

## 2022-12-30 DIAGNOSIS — N493 Fournier gangrene: Secondary | ICD-10-CM | POA: Diagnosis not present

## 2022-12-30 LAB — CBC WITH DIFFERENTIAL/PLATELET
Abs Immature Granulocytes: 0.29 10*3/uL — ABNORMAL HIGH (ref 0.00–0.07)
Basophils Absolute: 0 10*3/uL (ref 0.0–0.1)
Basophils Relative: 0 %
Eosinophils Absolute: 0 10*3/uL (ref 0.0–0.5)
Eosinophils Relative: 0 %
HCT: 26.1 % — ABNORMAL LOW (ref 39.0–52.0)
Hemoglobin: 8.4 g/dL — ABNORMAL LOW (ref 13.0–17.0)
Immature Granulocytes: 1 %
Lymphocytes Relative: 4 %
Lymphs Abs: 0.9 10*3/uL (ref 0.7–4.0)
MCH: 27 pg (ref 26.0–34.0)
MCHC: 32.2 g/dL (ref 30.0–36.0)
MCV: 83.9 fL (ref 80.0–100.0)
Monocytes Absolute: 0.6 10*3/uL (ref 0.1–1.0)
Monocytes Relative: 3 %
Neutro Abs: 19 10*3/uL — ABNORMAL HIGH (ref 1.7–7.7)
Neutrophils Relative %: 92 %
Platelets: 391 10*3/uL (ref 150–400)
RBC: 3.11 MIL/uL — ABNORMAL LOW (ref 4.22–5.81)
RDW: 21.4 % — ABNORMAL HIGH (ref 11.5–15.5)
WBC: 20.8 10*3/uL — ABNORMAL HIGH (ref 4.0–10.5)
nRBC: 0 % (ref 0.0–0.2)

## 2022-12-30 MED ORDER — ENOXAPARIN SODIUM 40 MG/0.4ML IJ SOSY
40.0000 mg | PREFILLED_SYRINGE | INTRAMUSCULAR | Status: DC
  Administered 2022-12-30 – 2022-12-31 (×2): 40 mg via SUBCUTANEOUS
  Filled 2022-12-30 (×2): qty 0.4

## 2022-12-30 NOTE — Progress Notes (Signed)
Mobility Specialist - Progress Note   12/30/22 0903  Mobility  Activity  (Bed Exercises)  Level of Assistance Independent  Range of Motion/Exercises Active  Activity Response Tolerated well  Mobility Referral Yes  $Mobility charge 1 Mobility  Mobility Specialist Start Time (ACUTE ONLY) 0845  Mobility Specialist Stop Time (ACUTE ONLY) 0901  Mobility Specialist Time Calculation (min) (ACUTE ONLY) 16 min   Pt was found in bed and agreeable to bed exercises. Pt able to complete the exercises listed below and at EOS was left in bed with all necessities in reach.  Supine BUE Exercises: 2x20 each,  (red) resistance band  1) Elbow Flexion  2) Elbow Extension   3) Horizontal Abduction   4) Stress Ball Squeezes  Billey Chang Mobility Specialist

## 2022-12-30 NOTE — Progress Notes (Signed)
[facility] Progress Note  Patient: [name]     ZOX:[id number]  DOA: 12/22/2022   PCP: Jethro Bastos, MD       Brief hospital course: This is a 67 year old male with adrenal insufficiency, history of DVT, history of lower GI bleed, paraplegic with no sensation below the abdomen, neurogenic bladder with suprapubic catheter, colostomy, multiple myeloma with metastasis who was sent from the cancer center due to fever, tachycardia, hypotension and a WBC count of 26.7 despite levofloxacin. He was initially admitted to the hospital for a UTI and was started on ceftriaxone.  WBC count was noted to improve slightly. 6/2-noted to have scrotal drainage.  Urology consulted and noted that the patient had an abscess with necrotic borders. 6/3-underwent debridement of necrotic tissue and given a diagnosis of scrotal abscess with foreign years gangrene -Antibiotics transitioned to Zosyn and Zyvox  Subjective:  The patient has no complaints as usual  Assessment and Plan: Principal Problem: UTI and Fournier's gangrene of scrotum-in immunocompromised patient -Status post extensive scrotal debridement and now with exposed testicles - Has been receiving PIP tazobactam and Zyvox - Urine culture reveals Proteus mirabilis which is resistant to ciprofloxacin and nitrofurantoin -Blood culture negative - ID recommended Doxy and Augmentin until 6/16- appreciate input - Transposition of testicles to thigh and partial wound closure completed on 6/7 and most of the wound is now closed-scrotal packing being done -Unfortunately WBC count is still high and is 20.8 today-he is afebrile  - Active Problems:    Multiple myeloma in relapse -Originally diagnosed in 2011 - Currently receiving Kyprolis every 2 weeks since June 2023 -Continue acyclovir  Microcytic, iron deficiency anemia - Continue to follow     T8 Paraplegia (HCC)   Neurogenic bladder   Neurogenic bowel -Patient has a  suprapubic catheter and a colostomy    History of DVT (deep vein thrombosis) -Status post IVC filter and managed with Xarelto - Oncology recommends indefinite treatment with Xarelto due to the patient's bedbound status -Xarelto currently on hold -continue Lovenox for DVT prophylaxis  Morbid obesity Body mass index is 36.81 kg/m.       Code Status: Full Code Consultants: Urology Level of Care: Level of care: Med-Surg Total time on patient care: 35 minutes DVT prophylaxis:  Objective:   Vitals:   12/29/22 1845 12/29/22 1853 12/29/22 2005 12/30/22 0439  BP: 101/82 114/79 105/67 115/73  Pulse: 79 79 75 77  Resp: 17 16 18 18   Temp: (!) 97.5 F (36.4 C) 97.6 F (36.4 C) 97.7 F (36.5 C) 98.2 F (36.8 C)  TempSrc:  Oral Oral Oral  SpO2: 99% 97% 100% 97%  Weight:      Height:       Filed Weights   12/22/22 1500 12/25/22 1258 12/29/22 1429  Weight: 109.8 kg 109.8 kg 109.8 kg   Exam: General exam: Appears comfortable  HEENT: oral mucosa moist Respiratory system: Clear to auscultation.  Cardiovascular system: S1 & S2 heard  Gastrointestinal system: Abdomen soft, non-tender, nondistended. Normal bowel sounds   Extremities: No cyanosis, clubbing or edema GU-small opening, about the size of a quarter remains in the right lower scrotum and is currently packed-no discharge and no bleeding noted Psychiatry:  Mood & affect appropriate.      CBC: Recent Labs  Lab 12/26/22 0542 12/27/22 0519 12/28/22 0547 12/29/22 0608 12/30/22 0643  WBC 18.9* 17.0* 14.5* 19.7* 20.8*  NEUTROABS 16.8* 14.5* 11.0* 15.8* 19.0*  HGB 7.4* 7.0* 6.6* 8.9* 8.4*  HCT 22.8* 21.7* 20.0* 27.0* 26.1*  MCV 78.6* 79.5* 78.7* 82.6 83.9  PLT 385 378 370 388 391    Basic Metabolic Panel: Recent Labs  Lab 12/24/22 0530 12/25/22 0534 12/26/22 0542 12/27/22 0519  NA 136 137 137 144  K 3.3* 3.6 4.9 3.9  CL 109 110 107 110  CO2 20* 23 23 26   GLUCOSE 129* 131* 197* 98  BUN 7* 6* 10 8  CREATININE  0.68 0.69 0.81 0.67  CALCIUM 8.1* 8.7* 8.6* 8.8*  MG 1.8 1.7 1.7 1.8    GFR: Estimated Creatinine Clearance: 106.3 mL/min (by C-G formula based on SCr of 0.67 mg/dL).  Scheduled Meds:  acyclovir  400 mg Oral BID   amoxicillin-clavulanate  1 tablet Oral Q12H   atorvastatin  20 mg Oral Daily   cholecalciferol  1,000 Units Oral Daily   dorzolamide-timolol  1 drop Both Eyes BID   doxycycline  100 mg Oral Q12H   feeding supplement (GLUCERNA SHAKE)  237 mL Oral BID BM   iron polysaccharides  150 mg Oral Daily   latanoprost  1 drop Both Eyes QHS   multivitamin with minerals  1 tablet Oral Daily   zinc oxide  1 Application Topical BID   Continuous Infusions:  lactated ringers 50 mL/hr at 12/30/22 0935   Imaging and lab data was personally reviewed No results found.  LOS: 8 days   Author: Calvert Cantor  12/30/2022 1:22 PM  To contact [facility]>   Check the care team in [facility] and look for the attending/consulting [facility] provider listed  Log into [email or web address] and use [place]'s universal password   Go to> "[facility]"  and find provider  If you still have difficulty reaching the provider, please page the [facility] (Director on Call) for the Hospitalists listed on amion

## 2022-12-31 DIAGNOSIS — N493 Fournier gangrene: Secondary | ICD-10-CM | POA: Diagnosis not present

## 2022-12-31 LAB — BASIC METABOLIC PANEL
Anion gap: 7 (ref 5–15)
BUN: 15 mg/dL (ref 8–23)
CO2: 26 mmol/L (ref 22–32)
Calcium: 8.3 mg/dL — ABNORMAL LOW (ref 8.9–10.3)
Chloride: 110 mmol/L (ref 98–111)
Creatinine, Ser: 0.6 mg/dL — ABNORMAL LOW (ref 0.61–1.24)
GFR, Estimated: >60 mL/min (ref >60)
Glucose, Bld: 112 mg/dL — ABNORMAL HIGH (ref 70–99)
Potassium: 3.5 mmol/L (ref 3.5–5.1)
Sodium: 143 mmol/L (ref 135–145)

## 2022-12-31 LAB — CBC WITH DIFFERENTIAL/PLATELET
Abs Immature Granulocytes: 0.43 10*3/uL — ABNORMAL HIGH (ref 0.00–0.07)
Basophils Absolute: 0.1 10*3/uL (ref 0.0–0.1)
Basophils Relative: 0 %
Eosinophils Absolute: 0.3 10*3/uL (ref 0.0–0.5)
Eosinophils Relative: 2 %
HCT: 24.8 % — ABNORMAL LOW (ref 39.0–52.0)
Hemoglobin: 8.1 g/dL — ABNORMAL LOW (ref 13.0–17.0)
Immature Granulocytes: 3 %
Lymphocytes Relative: 12 %
Lymphs Abs: 1.6 10*3/uL (ref 0.7–4.0)
MCH: 27.4 pg (ref 26.0–34.0)
MCHC: 32.7 g/dL (ref 30.0–36.0)
MCV: 83.8 fL (ref 80.0–100.0)
Monocytes Absolute: 1.1 10*3/uL — ABNORMAL HIGH (ref 0.1–1.0)
Monocytes Relative: 8 %
Neutro Abs: 10.1 10*3/uL — ABNORMAL HIGH (ref 1.7–7.7)
Neutrophils Relative %: 75 %
Platelets: 406 10*3/uL — ABNORMAL HIGH (ref 150–400)
RBC: 2.96 MIL/uL — ABNORMAL LOW (ref 4.22–5.81)
RDW: 21.7 % — ABNORMAL HIGH (ref 11.5–15.5)
WBC: 13.4 10*3/uL — ABNORMAL HIGH (ref 4.0–10.5)
nRBC: 0.1 % (ref 0.0–0.2)

## 2022-12-31 NOTE — Anesthesia Postprocedure Evaluation (Signed)
Anesthesia Post Note  Patient: [name]  Procedure(s) Performed: SCROTAL WOUND DEBRIDEMENT AND CLOSURE     Patient location during evaluation: PACU Anesthesia Type: General Level of consciousness: awake Pain management: pain level controlled Vital Signs Assessment: post-procedure vital signs reviewed and stable Respiratory status: spontaneous breathing, nonlabored ventilation and respiratory function stable Cardiovascular status: blood pressure returned to baseline and stable Postop Assessment: no apparent nausea or vomiting Anesthetic complications: no   No notable events documented.  Last Vitals:  Vitals:   12/30/22 2028 12/31/22 0601  BP: 110/72 119/68  Pulse: 91 91  Resp: 18 18  Temp: 36.6 C 37.2 C  SpO2: 95% 96%    Last Pain:  Vitals:   12/31/22 0601  TempSrc: Oral  PainSc: 0-No pain                 Cheray Pardi P Yaman Grauberger

## 2022-12-31 NOTE — Progress Notes (Signed)
[facility] Progress Note  Patient: [name]     QMV:[id number]  DOA: 12/22/2022   PCP: Jethro Bastos, MD       Brief hospital course: This is a 67 year old male with adrenal insufficiency, history of DVT, history of lower GI bleed, paraplegic with no sensation below the abdomen, neurogenic bladder with suprapubic catheter, colostomy, multiple myeloma with metastasis who was sent from the cancer center due to fever, tachycardia, hypotension and a WBC count of 26.7 despite levofloxacin. He was initially admitted to the hospital for a UTI and was started on ceftriaxone.  WBC count was noted to improve slightly. 6/2-noted to have scrotal drainage.  Urology consulted and noted that the patient had an abscess with necrotic borders. 6/3-underwent debridement of necrotic tissue and given a diagnosis of scrotal abscess with foreign years gangrene -Antibiotics transitioned to Zosyn and Zyvox  Subjective:  He has a mild cough. No sore throat or other flu like symptoms. No other complaints.   Assessment and Plan: Principal Problem: UTI and Fournier's gangrene of scrotum-in immunocompromised patient -Status post extensive scrotal debridement and now with exposed testicles - Has been receiving PIP tazobactam and Zyvox - Urine culture reveals Proteus mirabilis which is resistant to ciprofloxacin and nitrofurantoin -Blood culture negative - ID recommended Doxy and Augmentin until 6/16- appreciate input - Transposition of testicles to thigh and partial wound closure completed on 6/7 and most of the wound is now closed-scrotal packing being done -WBC 14> 19> 20> today has improved to 13.4  - Active Problems:    Multiple myeloma in relapse -Originally diagnosed in 2011 - Currently receiving Kyprolis every 2 weeks since June 2023 -Continue acyclovir  Microcytic, iron deficiency anemia - Hgb stable ~ 8  Mild Thrombocytosis - follow   T8 Paraplegia (HCC)   Neurogenic  bladder   Neurogenic bowel -Patient has a suprapubic catheter and a colostomy    History of DVT (deep vein thrombosis) -Status post IVC filter and managed with Xarelto - Oncology recommends indefinite treatment with Xarelto due to the patient's bedbound status -Xarelto currently on hold -continue Lovenox for DVT prophylaxis  Morbid obesity Body mass index is 36.81 kg/m.       Code Status: Full Code Consultants: Urology Level of Care: Level of care: Med-Surg Total time on patient care: 35 minutes DVT prophylaxis:  Objective:   Vitals:   12/30/22 1407 12/30/22 2028 12/31/22 0601 12/31/22 1249  BP: 119/69 110/72 119/68 126/78  Pulse: 86 91 91 77  Resp: 17 18 18 17   Temp: 98 F (36.7 C) 97.8 F (36.6 C) 98.9 F (37.2 C) 98.1 F (36.7 C)  TempSrc: Oral Oral Oral Oral  SpO2: 96% 95% 96% 99%  Weight:      Height:       Filed Weights   12/22/22 1500 12/25/22 1258 12/29/22 1429  Weight: 109.8 kg 109.8 kg 109.8 kg   Exam: General exam: Appears comfortable  HEENT: oral mucosa moist Respiratory system: lungs are clear Cardiovascular system: S1 & S2 heard  Gastrointestinal system: Abdomen soft, non-tender, nondistended. Normal bowel sounds   Extremities: No cyanosis, clubbing or edema GU-small opening, about the size of a quarter remains in the right lower scrotum and is currently packed-no discharge and no bleeding noted Psychiatry:  Mood & affect appropriate.      CBC: Recent Labs  Lab 12/27/22 0519 12/28/22 0547 12/29/22 0608 12/30/22 0643 12/31/22 0620  WBC 17.0* 14.5* 19.7* 20.8* 13.4*  NEUTROABS 14.5* 11.0* 15.8* 19.0*  10.1*  HGB 7.0* 6.6* 8.9* 8.4* 8.1*  HCT 21.7* 20.0* 27.0* 26.1* 24.8*  MCV 79.5* 78.7* 82.6 83.9 83.8  PLT 378 370 388 391 406*    Basic Metabolic Panel: Recent Labs  Lab 12/25/22 0534 12/26/22 0542 12/27/22 0519 12/31/22 0620  NA 137 137 144 143  K 3.6 4.9 3.9 3.5  CL 110 107 110 110  CO2 23 23 26 26   GLUCOSE 131* 197* 98  112*  BUN 6* 10 8 15   CREATININE 0.69 0.81 0.67 0.60*  CALCIUM 8.7* 8.6* 8.8* 8.3*  MG 1.7 1.7 1.8  --     GFR: Estimated Creatinine Clearance: 106.3 mL/min (A) (by C-G formula based on SCr of 0.6 mg/dL (L)).  Scheduled Meds:  acyclovir  400 mg Oral BID   amoxicillin-clavulanate  1 tablet Oral Q12H   atorvastatin  20 mg Oral Daily   cholecalciferol  1,000 Units Oral Daily   dorzolamide-timolol  1 drop Both Eyes BID   doxycycline  100 mg Oral Q12H   enoxaparin (LOVENOX) injection  40 mg Subcutaneous Q24H   feeding supplement (GLUCERNA SHAKE)  237 mL Oral BID BM   iron polysaccharides  150 mg Oral Daily   latanoprost  1 drop Both Eyes QHS   multivitamin with minerals  1 tablet Oral Daily   zinc oxide  1 Application Topical BID   Continuous Infusions:   Imaging and lab data was personally reviewed No results found.  LOS: 9 days   Author: Calvert Cantor  12/31/2022 3:15 PM  To contact [facility]>   Check the care team in [facility] and look for the attending/consulting [facility] provider listed  Log into [email or web address] and use [place]'s universal password   Go to> "[facility]"  and find provider  If you still have difficulty reaching the provider, please page the [facility] (Director on Call) for the Hospitalists listed on amion

## 2023-01-01 DIAGNOSIS — N493 Fournier gangrene: Secondary | ICD-10-CM | POA: Diagnosis not present

## 2023-01-01 LAB — CBC WITH DIFFERENTIAL/PLATELET
Abs Immature Granulocytes: 0.37 10*3/uL — ABNORMAL HIGH (ref 0.00–0.07)
Basophils Absolute: 0.1 10*3/uL (ref 0.0–0.1)
Basophils Relative: 1 %
Eosinophils Absolute: 0.4 10*3/uL (ref 0.0–0.5)
Eosinophils Relative: 3 %
HCT: 24.5 % — ABNORMAL LOW (ref 39.0–52.0)
Hemoglobin: 7.9 g/dL — ABNORMAL LOW (ref 13.0–17.0)
Immature Granulocytes: 3 %
Lymphocytes Relative: 14 %
Lymphs Abs: 1.7 10*3/uL (ref 0.7–4.0)
MCH: 27.1 pg (ref 26.0–34.0)
MCHC: 32.2 g/dL (ref 30.0–36.0)
MCV: 83.9 fL (ref 80.0–100.0)
Monocytes Absolute: 0.9 10*3/uL (ref 0.1–1.0)
Monocytes Relative: 8 %
Neutro Abs: 8.4 10*3/uL — ABNORMAL HIGH (ref 1.7–7.7)
Neutrophils Relative %: 71 %
Platelets: 379 10*3/uL (ref 150–400)
RBC: 2.92 MIL/uL — ABNORMAL LOW (ref 4.22–5.81)
RDW: 21.8 % — ABNORMAL HIGH (ref 11.5–15.5)
WBC: 11.9 10*3/uL — ABNORMAL HIGH (ref 4.0–10.5)
nRBC: 0.3 % — ABNORMAL HIGH (ref 0.0–0.2)

## 2023-01-01 MED ORDER — RIVAROXABAN 20 MG PO TABS
20.0000 mg | ORAL_TABLET | Freq: Every day | ORAL | Status: DC
  Administered 2023-01-01: 20 mg via ORAL
  Filled 2023-01-01: qty 1

## 2023-01-01 MED ORDER — JUVEN PO PACK
1.0000 | PACK | Freq: Two times a day (BID) | ORAL | Status: DC
  Administered 2023-01-01 – 2023-01-08 (×14): 1 via ORAL
  Filled 2023-01-01 (×14): qty 1

## 2023-01-01 NOTE — Progress Notes (Signed)
Mobility Specialist - Progress Note   01/01/23 1038  Mobility  Activity  (bed Level Excercises)  Range of Motion/Exercises Active;Left arm;Right arm  Activity Response Tolerated well  Mobility Referral Yes  $Mobility charge 1 Mobility  Mobility Specialist Start Time (ACUTE ONLY) 1019  Mobility Specialist Stop Time (ACUTE ONLY) 1037  Mobility Specialist Time Calculation (min) (ACUTE ONLY) 18 min   Pt received in bed and agreeable to participate in bed level UE exercises. No complaints during session. See below for exercises.   Supine BUE Exercises: 10 reps each,  red & dark green resistance band  1) Elbow Flexion  2) Elbow extension   3) Horizontal Abduction              4) Stress Ball Squeezes  [employer]

## 2023-01-01 NOTE — Progress Notes (Addendum)
Nutrition Follow-up  DOCUMENTATION CODES:   Obesity unspecified  INTERVENTION:   -Glucerna Shake po TID, each supplement provides 220 kcal and 10 grams of protein   -Multivitamin with minerals daily  -1 packet Juven BID, each packet provides 95 calories, 2.5 grams of protein (collagen), and 9.8 grams of carbohydrate (3 grams sugar); also contains 7 grams of L-arginine and L-glutamine, 300 mg vitamin C, 15 mg vitamin E, 1.2 mcg vitamin B-12, 9.5 mg zinc, 200 mg calcium, and 1.5 g  Calcium Beta-hydroxy-Beta-methylbutyrate to support wound healing   NUTRITION DIAGNOSIS:   Inadequate oral intake related to poor appetite as evidenced by per patient/family report.  Improving.  GOAL:   Patient will meet greater than or equal to 90% of their needs  Progressing.  MONITOR:   PO intake, Supplement acceptance  ASSESSMENT:   67 y.o. male with PMH of adrenal insufficiency, anemia, cancer, T2DM , multiple myeloma, paraplegia, partial SBO. Pt with colostomy to LLQ. Pt is currently receiving medical management related to SIRS.  6/3: I&D 6/7: scrotal wound debridement  Patient currently consuming 100% of meals.  Accepting Glucerna shakes. Have added Juven to aid in wound healing.   Admission weight: 242 lbs  Medications: Vitamin D, Niferex, Multivitamin with minerals daily  Labs reviewed: CBGs: 77-143  Diet Order:   Diet Order             Diet regular Room service appropriate? Yes; Fluid consistency: Thin  Diet effective now                   EDUCATION NEEDS:   Not appropriate for education at this time  Skin:  Skin Assessment: Skin Integrity Issues: Skin Integrity Issues:: Incisions Incisions: 6/3 scrotum, 6/7 perineum  Last BM:  6/10 -colostomy  Height:   Ht Readings from Last 1 Encounters:  12/29/22 5\' 8"  (1.727 m)    Weight:   Wt Readings from Last 1 Encounters:  12/29/22 109.8 kg    BMI:  Body mass index is 36.81 kg/m.  Estimated Nutritional  Needs:   Kcal:  2100-2300  Protein:  110-135 gm  Fluid:  >/= 2.2 L    Tilda Franco, MS, RD, LDN Inpatient Clinical Dietitian Contact information available via Amion

## 2023-01-01 NOTE — Progress Notes (Signed)
Error

## 2023-01-01 NOTE — Progress Notes (Signed)
[facility] Infectious Diseases Follow Up Note  Patient Identification: Patient Name: [name] MRN: [id number] Admit Date: 12/22/2022  2:23 PM Age: 67 y.o.Today's Date: 01/01/2023  Reason for Visit: Fournier's gangrene  Principal Problem:   Fournier's gangrene of scrotum Active Problems:   Paraplegia (HCC)   Neurogenic bladder   Multiple myeloma in relapse (HCC)   Neurogenic bowel   Iron deficiency anemia   Hypoalbuminemia   History of DVT (deep vein thrombosis)   Type 2 diabetes mellitus with obesity (HCC)   OSA (obstructive sleep apnea)   Glaucoma   Scrotal wall abscess   Current Antibiotics: Doxycycline 6/6-c Augmentin 6/6-c Total days of antibiotics day 11  Acyclovir ppx  Lines/Hardwares: LLQ colostomy, SPC catheter  Interval Events: Remains afebrile, leukocytosis is downtrending. Status post OR on 6/11  Assessment 67 year old male with iatrogenic AI (prior chronic steroid use, DVT status post IVC on AC, DM 2 multiple myeloma with mets to T8 vertebra with paraplegia status post resection follows Oncology ( Kyprolis and Dexamethasone on 01/06/2022 and acyclovir prophylaxis)  neurogenic bladder status post [facility] who was admitted from cancer center on 5/31 with sepsis (levofloxacin for 3 days prior to arrival for nonspecific febrile illness) admitted with   # Nec Fasc perineum, clinically improving  6/3 status post I&D.  No or culture sent 5/31 Urine culture Proteus mirabilis 5/31 blood culture negative 6/7 second stage scrotal wound debridement, testicular transposition inside thigh pouches bilaterally.  Or findings with no evidence of residual purulence or active infection  # Medication monitoring 6/9 Cr wnl and leukocytosis downtrending   Recommendations Continue doxycycline and augmentin as broad coverage as no OR cultures to target Plan for 10 days from last debridement on 6/7. EOT 6/17 Follow up with  Urology as intsructed Patient already has an appointment on  6/19 at 3: 45 pm with Jeanine Luz and can keep.  D/w Urology  ID will so, please call with questions   Rest of the management as per the primary team. Thank you for the consult. Please page with pertinent questions or concerns.  ______________________________________________________________________ Subjective patient seen and examined at the bedside. No concerns   Past Medical History:  Diagnosis Date   Adrenal insufficiency (HCC)    on chronic dexamethasone   Anemia    Cancer (HCC)    Coagulopathy (HCC)    on xeralto/ s/p DVT while on coumadin,  IVC in place   Diabetes mellitus without complication (HCC)    type 2   Glaucoma 12/22/2022   Gross hematuria 01/2013   post foley cath procedure   History of blood transfusion 01/2013   Lower GI bleeding 04/18/2017   Multiple myeloma    thoracic T8 with paraplegia s/p resection- on chemo at visit 10/13/10   Multiple myeloma    Multiple myeloma without mention of remission    Neurogenic bladder    Neurogenic bowel    OSA (obstructive sleep apnea) 11/01/2022   Paraplegia (HCC)    Partial small bowel obstruction (HCC) during dec 2011 admission   Past Surgical History:  Procedure Laterality Date   COLONOSCOPY WITH PROPOFOL N/A 04/12/2017   Procedure: COLONOSCOPY WITH PROPOFOL;  Surgeon: Hilarie Fredrickson, MD;  Location: [facility];  Service: Endoscopy;  Laterality: N/A;   COLONOSCOPY WITH PROPOFOL N/A 04/19/2017   Procedure: COLONOSCOPY WITH PROPOFOL;  Surgeon: Benancio Deeds, MD;  Location: [facility];  Service: Gastroenterology;  Laterality: N/A;   COLOSTOMY  07/20/2011   Procedure: COLOSTOMY;  Surgeon: Arlys John  Christoper Allegra, DO;  Location[facility] OR;  Service: General;;   COLOSTOMY REVISION  07/20/2011   Procedure: COLON RESECTION SIGMOID;  Surgeon: Rulon Abide, DO;  Location: [facility] OR;  Service: General;;   CYSTOSCOPY N/A 04/04/2013   Procedure: CYSTOSCOPY WITH  LITHALOPAXY;  Surgeon: Sebastian Ache, MD;  Location: [facility] ORS;  Service: Urology;  Laterality: N/A;   INCISION AND DRAINAGE ABSCESS N/A 12/25/2022   Procedure: INCISION AND DRAINAGE OF SCROTUM;  Surgeon: Despina Arias, MD;  Location: [facility];  Service: Urology;  Laterality: N/A;   INSERTION OF SUPRAPUBIC CATHETER N/A 04/04/2013   Procedure: INSERTION OF SUPRAPUBIC CATHETER;  Surgeon: Sebastian Ache, MD;  Location: [facility];  Service: Urology;  Laterality: N/A;   LAPAROTOMY  07/20/2011   Procedure: EXPLORATORY LAPAROTOMY;  Surgeon: Rulon Abide, DO;  Location: [facility] OR;  Service: General;  Laterality: N/A;   myeloma thoracic T8 with parpaplegia s/p thoracotomy and thoracic T7-9 cage placement on Dec 26th 2011  07/18/10   SCROTAL EXPLORATION N/A 12/29/2022   Procedure: SCROTAL WOUND DEBRIDEMENT AND CLOSURE;  Surgeon: Loletta Parish., MD;  Location: [facility];  Service: Urology;  Laterality: N/A;    Vitals BP 112/72 (BP Location: Left Arm)   Pulse 81   Temp 98.4 F (36.9 C) (Oral)   Resp 18   Ht 5\' 8"  (1.727 m)   Wt 109.8 kg   SpO2 98%   BMI 36.81 kg/m     Physical Exam Constitutional: Lying in the bed and having dressing changed by urology    Comments: Appears comfortable  Cardiovascular:     Rate and Rhythm: Normal rate and regular rhythm.     Heart sounds:  Pulmonary:     Effort: Pulmonary effort is normal on room air    Comments:   Abdominal:     Palpations: Abdomen is soft.     Tenderness: Nondistended  Skin:    Comments: No rashes  Neurological:     General: Awake, alert and oriented.  T8 paraplegia, colostomy and SPC in place with no concerns   Psychiatric:        Mood and Affect: Mood normal.   Pertinent Microbiology Results for orders placed or performed in visit on 12/22/22  Culture, Blood     Status: None   Collection Time: 12/22/22 10:56 AM   Specimen: BLOOD LEFT ARM  Result Value Ref Range Status   Specimen Description BLOOD LEFT ARM  Final   Special  Requests   Final    BOTTLES DRAWN AEROBIC AND ANAEROBIC Blood Culture adequate volume   Culture   Final    NO GROWTH 5 DAYS Performed at [facility], [address] [place], [place] [address]    Report Status 12/27/2022 FINAL  Final  Urine Culture     Status: Abnormal   Collection Time: 12/22/22  2:40 PM   Specimen: Urine, Clean Catch  Result Value Ref Range Status   Specimen Description   Final    URINE, CLEAN CATCH Performed at [facility], [address] [place], [place] [address]    Special Requests   Final    NONE Performed at [facility], [address], [place], [place] [address]    Culture >=100,000 COLONIES/mL PROTEUS MIRABILIS (A)  Final   Report Status 12/24/2022 FINAL  Final   Organism ID, Bacteria PROTEUS MIRABILIS (A)  Final      Susceptibility   Proteus mirabilis - MIC*  AMPICILLIN 8 SENSITIVE Sensitive     CEFAZOLIN <=4 SENSITIVE Sensitive     CEFEPIME <=0.12 SENSITIVE Sensitive     CEFTRIAXONE <=0.25 SENSITIVE Sensitive     CIPROFLOXACIN >=4 RESISTANT Resistant     GENTAMICIN <=1 SENSITIVE Sensitive     IMIPENEM 1 SENSITIVE Sensitive     NITROFURANTOIN RESISTANT Resistant     TRIMETH/SULFA <=20 SENSITIVE Sensitive     AMPICILLIN/SULBACTAM <=2 SENSITIVE Sensitive     * >=100,000 COLONIES/mL PROTEUS MIRABILIS   *Note: Due to a large number of results and/or encounters for the requested time period, some results have not been displayed. A complete set of results can be found in Results Review.   Pertinent Lab.    Latest Ref Rng & Units 01/01/2023    6:25 AM 12/31/2022    6:20 AM 12/30/2022    6:43 AM  CBC  WBC 4.0 - 10.5 K/uL 11.9  13.4  20.8   Hemoglobin 13.0 - 17.0 g/dL 7.9  8.1  8.4   Hematocrit 39.0 - 52.0 % 24.5  24.8  26.1   Platelets 150 - 400 K/uL 379  406  391       Latest Ref Rng & Units 12/31/2022    6:20 AM 12/27/2022    5:19 AM 12/26/2022    5:42 AM  CMP  Glucose 70 - 99 mg/dL 161   98  096   BUN 8 - 23 mg/dL 15  8  10    Creatinine 0.61 - 1.24 mg/dL 0.45  4.09  8.11   Sodium 135 - 145 mmol/L 143  144  137   Potassium 3.5 - 5.1 mmol/L 3.5  3.9  4.9   Chloride 98 - 111 mmol/L 110  110  107   CO2 22 - 32 mmol/L 26  26  23    Calcium 8.9 - 10.3 mg/dL 8.3  8.8  8.6      Pertinent Imaging today Plain films and CT images have been personally visualized and interpreted; radiology reports have been reviewed. Decision making incorporated into the Impression   US SCROTUM W/DOPPLER  Result Date: 12/24/2022 CLINICAL DATA:  Pain and swelling EXAM: SCROTAL ULTRASOUND DOPPLER ULTRASOUND OF THE TESTICLES TECHNIQUE: Complete ultrasound examination of the testicles, epididymis, and other scrotal structures was performed. Color and spectral Doppler ultrasound were also utilized to evaluate blood flow to the testicles. COMPARISON:  None Available. FINDINGS: Right testicle Measurements: 3.1 x 2.1 x 2.4 cm. There is slightly in homogeneous echogenicity in the right testis without discrete focal lesions. There is 2 x 2.1 x 2.6 cm hyperechoic area adjacent to the inferior margin of the right testis. Left testicle Measurements: 2.6 x 1.4 x 2.2 cm. There is slightly inhomogeneous echogenicity in the left testis. There is 2.3 x 2.9 x 1.9 cm mixed echogenic area adjacent to the inferior margin of the left testis. Right epididymis:  Normal in size and appearance. Left epididymis:  Normal in size and appearance. Hydrocele:  Small to moderate bilateral hydronephrosis. Varicocele:  None visualized. Pulsed Doppler interrogation of both testes demonstrates normal low resistance arterial and venous waveforms bilaterally. IMPRESSION: There is no evidence of testicular torsion. The slightly inhomogeneous echogenicity in both testes without discrete measurable focal lesions. There is 2 x 2.1 x 2.6 cm hyperechoic area adjacent to the inferior margin of the right testis. There is 2.3 x 2.9 x 1.9 cm mixed echogenic area  inferior to the left testis. Significance of this finding is not clear. This may suggest chronic  inflammatory process or unusual neoplastic process. Urology consultation and short-term follow-up scrotal sonogram may be considered. Moderate bilateral hydronephrosis. Electronically Signed   By: Ernie Avena M.D.   On: 12/24/2022 18:17   DG Chest 1 View  Result Date: 12/21/2022 CLINICAL DATA:  Persistent cough. EXAM: CHEST  1 VIEW COMPARISON:  Chest radiograph 08/21/2018 FINDINGS: Low lung volume exam. Stable cardiac and mediastinal contours with prominent hila bilaterally. Lungs are clear. No pleural effusion or pneumothorax. Thoracic spinal fusion hardware. Stable deformity left lateral rib. IMPRESSION: Low lung volume exam. No acute cardiopulmonary process. Electronically Signed   By: Annia Belt M.D.   On: 12/21/2022 06:27     I have personally spent 52  minutes involved in face-to-face and non-face-to-face activities for this patient on the day of the visit. Professional time spent includes the following activities: Preparing to see the patient (review of tests), Obtaining and/or reviewing separately obtained history (admission/discharge record), Performing a medically appropriate examination and/or evaluation , Ordering medications/tests/procedures, referring and communicating with other health care professionals, Documenting clinical information in the EMR, Independently interpreting results (not separately reported), Communicating results to the patient/family/caregiver, Counseling and educating the patient/family/caregiver and Care coordination (not separately reported).   Plan d/w requesting provider as well as ID pharm D  Note: This document was prepared using dragon voice recognition software and may include unintentional dictation errors.   Electronically signed by:   Odette Fraction, MD Infectious Disease Physician [facility] Infect[facility] Pager:  [phone]

## 2023-01-01 NOTE — TOC Progression Note (Addendum)
Transition of Care [facility]) - Progression Note    Patient Details  Name: [name] MRN: [id number] Date of Birth: 02/18/1956  Transition of Care [facility]) CM/SW Contact  Beckie Busing, RN Phone Number:[phone]  01/01/2023, 3:30 PM  Clinical Narrative:    [facility] acknowledges consult for "Need daily dressing changes - wife an [facility] can alternate- If daily dressings cannot be arranged at home, will need SNF  Medically ready to dc once dressing changes arranged ." CM spoke with Marylene Land at [facility]. Per Marylene Land the patient goes to the day program at [facility] where he has access to the clinic and can receive dressing changes. Wife will need to receive instructions on care to fill in on the days that he does not have access to the clinic.(Wed/ Sat./ Sun)  Marylene Land states that discharge summary will need to be faxed prior to discharge to ensure that dressing changes can be done. Please fax orders to [phone]. It would be helpful if the MD could speak with Cindi Carbon NP to discuss wound care orders [phone]. Message has been sent to MD.   Expected Discharge Plan: Home w Home Health Services Barriers to Discharge: Continued Medical Work up  Expected Discharge Plan and Services In-house Referral: NA Discharge Planning Services: CM Consult Post Acute Care Choice: NA                   DME Arranged: N/A DME Agency: NA       HH Arranged: PCS/Personal Care Services (all services contracted through [facility])           Social Determinants of Health (SDOH) Interventions SDOH Screenings   Food Insecurity: No Food Insecurity (12/22/2022)  Housing: Low Risk  (12/22/2022)  Transportation Needs: No Transportation Needs (12/22/2022)  Utilities: Not At Risk (12/22/2022)  Tobacco Use: Low Risk  (12/30/2022)    Readmission Risk Interventions    12/26/2022    3:45 PM 12/26/2022    3:25 PM  Readmission Risk Prevention Plan  Post Dischage Appt Complete Complete  Medication  Screening Complete Complete  Transportation Screening Complete Complete

## 2023-01-01 NOTE — Progress Notes (Signed)
3 Days Post-Op   Subjective/Chief Complaint:  1 - Neurogenic Bladder - Pt wtih T8 hemiplegia due to invasive multiple myeloma with subsequenst areflexic neurogenic bladder. SPT placed 03/2013 (36F silicone) and changed Q2 Weeks by [facility]. Also irrigates PRN to reduce sediment.   Recent Summarized Surveillance:  04/2022 - Cr 0.74 , Renal US w/o stones or hydro   2 - Fournier's Gangrene - s/p OR debridement 12/25/22 for very early necrotizing infectgion of scrotal area.  Second look, thigh pouch creation, and partial closure on 12/29/2022.  Objective: Vital signs in last 24 hours: Temp:  [98.1 F (36.7 C)-98.4 F (36.9 C)] 98.4 F (36.9 C) (06/10 0456) Pulse Rate:  [77-87] 81 (06/10 0456) Resp:  [17-18] 18 (06/10 0456) BP: (112-126)/(72-80) 112/72 (06/10 0456) SpO2:  [98 %-99 %] 98 % (06/10 0456) Last BM Date : 12/31/22  Intake/Output from previous day: 06/09 0701 - 06/10 0700 In: 480 [P.O.:480] Out: 2000 [Urine:1300; Stool:700] Intake/Output this shift: Total I/O In: 240 [P.O.:240] Out: -   EXAM: NAD, In good spirits as always, very pleasant. Pulm: Non-labored breathign on minimal [place] O2 Stable truncal obesity GU: Scrotal defect is largely closed with loose suturing.  2 small openings at bottom of scrotum.  No erythema or purulent drainage. SPT with non-foul urine.  MSK: Stable LE contracures  c/w paraplegia  Lab Results:  Recent Labs    12/31/22 0620 01/01/23 0625  WBC 13.4* 11.9*  HGB 8.1* 7.9*  HCT 24.8* 24.5*  PLT 406* 379   BMET Recent Labs    12/31/22 0620  NA 143  K 3.5  CL 110  CO2 26  GLUCOSE 112*  BUN 15  CREATININE 0.60*  CALCIUM 8.3*   PT/INR No results for input(s): "LABPROT", "INR" in the last 72 hours. ABG No results for input(s): "PHART", "HCO3" in the last 72 hours.  Invalid input(s): "PCO2", "PO2"  Studies/Results: No results found.  Anti-infectives: Anti-infectives (From admission, onward)    Start     Dose/Rate Route  Frequency Ordered Stop   12/28/22 1800  doxycycline (VIBRA-TABS) tablet 100 mg        100 mg Oral Every 12 hours 12/28/22 1456     12/28/22 1545  amoxicillin-clavulanate (AUGMENTIN) 875-125 MG per tablet 1 tablet        1 tablet Oral Every 12 hours 12/28/22 1456     12/25/22 1730  linezolid (ZYVOX) IVPB 600 mg  Status:  Discontinued        600 mg 300 mL/hr over 60 Minutes Intravenous 2 times daily 12/25/22 1630 12/28/22 1456   12/25/22 1730  piperacillin-tazobactam (ZOSYN) IVPB 3.375 g  Status:  Discontinued        3.375 g 12.5 mL/hr over 240 Minutes Intravenous Every 8 hours 12/25/22 1630 12/28/22 1456   12/25/22 0300  vancomycin (VANCOCIN) IVPB 1000 mg/200 mL premix  Status:  Discontinued        1,000 mg 200 mL/hr over 60 Minutes Intravenous Every 12 hours 12/24/22 1344 12/25/22 1630   12/24/22 1430  vancomycin (VANCOREADY) IVPB 2000 mg/400 mL        2,000 mg 200 mL/hr over 120 Minutes Intravenous  Once 12/24/22 1342 12/24/22 1624   12/23/22 0000  ceFEPIme (MAXIPIME) 2 g in sodium chloride 0.9 % 100 mL IVPB  Status:  Discontinued        2 g 200 mL/hr over 30 Minutes Intravenous Every 8 hours 12/22/22 1501 12/22/22 1559   12/22/22 2200  acyclovir (ZOVIRAX) tablet  400 mg        400 mg Oral 2 times daily 12/22/22 1632     12/22/22 1800  cefTRIAXone (ROCEPHIN) 2 g in sodium chloride 0.9 % 100 mL IVPB  Status:  Discontinued        2 g 200 mL/hr over 30 Minutes Intravenous Every 24 hours 12/22/22 1559 12/25/22 1630   12/22/22 1530  ceFEPIme (MAXIPIME) 2 g in sodium chloride 0.9 % 100 mL IVPB  Status:  Discontinued        2 g 200 mL/hr over 30 Minutes Intravenous  Once 12/22/22 1436 12/22/22 1559       Assessment/Plan:  S/p significant scrotal debridement of necrotic tissue with Dr. Lafonda Mosses on 12/25/2022.  Taken back for second look, creation of thigh pouches, and partial closure by Dr. Berneice Heinrich on 12/29/2022.  Sinus and drained at time of surgery and there was no fluid collection to collect  in the OR.  ID following.  Transition from Zosyn to Doxy/Augmentin.  Treatment to continue through 6/16  Of note, when probing it feels as though the bottom of the wound is palpable at around the 7 o'clock position.  Rotating to around the 8 o'clock position falls into another tract that is around 6 to 7 cm deep.  Completed wound care with nursing today  Scherrie Bateman Starlyn Droge 01/01/2023

## 2023-01-01 NOTE — Care Management Important Message (Signed)
Important Message  Patient Details IM Letter given. Name: [name] MRN: [id number] Date of Birth: 01-31-1956   Medicare Important Message Given:  Yes     [name] 01/01/2023, 9:28 AM

## 2023-01-01 NOTE — Progress Notes (Addendum)
[facility] Progress Note  Patient: [name]     ZOX:[id number]  DOA: 12/22/2022   PCP: Jethro Bastos, MD       Brief hospital course: This is a 67 year old male with adrenal insufficiency, history of DVT, history of lower GI bleed, paraplegic with no sensation below the abdomen, neurogenic bladder with suprapubic catheter, colostomy, multiple myeloma with metastasis who was sent from the cancer center due to fever, tachycardia, hypotension and a WBC count of 26.7 despite levofloxacin. He was initially admitted to the hospital for a UTI and was started on ceftriaxone.  WBC count was noted to improve slightly. 6/2-noted to have scrotal drainage.  Urology consulted and noted that the patient had an abscess with necrotic borders. 6/3-underwent debridement of necrotic tissue and given a diagnosis of scrotal abscess with foreign years gangrene -Antibiotics transitioned to Zosyn and Zyvox  Subjective:  No new complaints. Urology spoke with his wife and teaching of dressing changes arranged for Wed.   Assessment and Plan: Principal Problem: UTI and Fournier's gangrene of scrotum-in immunocompromised patient -Status post extensive scrotal debridement and now with exposed testicles - Has been receiving PIP tazobactam and Zyvox - Urine culture reveals Proteus mirabilis which is resistant to ciprofloxacin and nitrofurantoin -Blood culture negative - ID recommended Doxy and Augmentin until 6/17- appreciate input - Transposition of testicles to thigh and partial wound closure completed on 6/7 and most of the wound is now closed-scrotal packing being done -WBC 14> 19> 20>11.9 - stable to dc once we are able to obtain daily wound care- have ordered [facility]- Wife will be educated by Urology- May need SNF for wound care if cannot get daily wound care at home  - Active Problems:    Multiple myeloma in relapse -Originally diagnosed in 2011 - Currently receiving Kyprolis every 2  weeks since June 2023 -Continue acyclovir  Microcytic, iron deficiency anemia - microcytosis resolved - Hgb stable ~ 8  Mild Thrombocytosis - follow   T8 Paraplegia (HCC)   Neurogenic bladder   Neurogenic bowel -Patient has a suprapubic catheter and a colostomy    History of DVT (deep vein thrombosis) -Status post IVC filter and managed with Xarelto - Oncology recommends indefinite treatment with Xarelto due to the patient's bedbound status -Xarelto held- using Lovenox for DVT prophylaxis- Would hold off on Full dose Xarelto for a few more days to allow wound healing- will transition to DVT prophylaxis Xarelto  Morbid obesity Body mass index is 36.81 kg/m.       Code Status: Full Code Consultants: Urology Level of Care: Level of care: Med-Surg Total time on patient care: 35 minutes DVT prophylaxis:  Objective:   Vitals:   12/31/22 0601 12/31/22 1249 12/31/22 2054 01/01/23 0456  BP: 119/68 126/78 116/80 112/72  Pulse: 91 77 87 81  Resp: 18 17 18 18   Temp: 98.9 F (37.2 C) 98.1 F (36.7 C) 98.2 F (36.8 C) 98.4 F (36.9 C)  TempSrc: Oral Oral Oral Oral  SpO2: 96% 99% 98% 98%  Weight:      Height:       Filed Weights   12/22/22 1500 12/25/22 1258 12/29/22 1429  Weight: 109.8 kg 109.8 kg 109.8 kg   Exam: General exam: Appears comfortable  HEENT: oral mucosa moist Respiratory system: Clear to auscultation.  Cardiovascular system: S1 & S2 heard  Gastrointestinal system: Abdomen soft, non-tender, nondistended. Normal bowel sounds   GU: wound noted in left scrotum Extremities: No cyanosis, clubbing or edema Psychiatry:  Mood & affect appropriate.      CBC: Recent Labs  Lab 12/28/22 0547 12/29/22 0608 12/30/22 0643 12/31/22 0620 01/01/23 0625  WBC 14.5* 19.7* 20.8* 13.4* 11.9*  NEUTROABS 11.0* 15.8* 19.0* 10.1* 8.4*  HGB 6.6* 8.9* 8.4* 8.1* 7.9*  HCT 20.0* 27.0* 26.1* 24.8* 24.5*  MCV 78.7* 82.6 83.9 83.8 83.9  PLT 370 388 391 406* 379    Basic  Metabolic Panel: Recent Labs  Lab 12/26/22 0542 12/27/22 0519 12/31/22 0620  NA 137 144 143  K 4.9 3.9 3.5  CL 107 110 110  CO2 23 26 26   GLUCOSE 197* 98 112*  BUN 10 8 15   CREATININE 0.81 0.67 0.60*  CALCIUM 8.6* 8.8* 8.3*  MG 1.7 1.8  --     GFR: Estimated Creatinine Clearance: 106.3 mL/min (A) (by C-G formula based on SCr of 0.6 mg/dL (L)).  Scheduled Meds:  acyclovir  400 mg Oral BID   amoxicillin-clavulanate  1 tablet Oral Q12H   atorvastatin  20 mg Oral Daily   cholecalciferol  1,000 Units Oral Daily   dorzolamide-timolol  1 drop Both Eyes BID   doxycycline  100 mg Oral Q12H   enoxaparin (LOVENOX) injection  40 mg Subcutaneous Q24H   feeding supplement (GLUCERNA SHAKE)  237 mL Oral BID BM   iron polysaccharides  150 mg Oral Daily   latanoprost  1 drop Both Eyes QHS   multivitamin with minerals  1 tablet Oral Daily   nutrition supplement (JUVEN)  1 packet Oral BID BM   zinc oxide  1 Application Topical BID   Continuous Infusions:   Imaging and lab data was personally reviewed No results found.  LOS: 10 days   Author: Calvert Cantor  01/01/2023 1:44 PM  To contact [facility]>   Check the care team in [facility] and look for the attending/consulting [facility] provider listed  Log into [email or web address] and use [place]'s universal password   Go to> "[facility]"  and find provider  If you still have difficulty reaching the provider, please page the [facility] (Director on Call) for the Hospitalists listed on amion

## 2023-01-01 NOTE — Progress Notes (Signed)
Mobility Specialist - Progress Note   01/01/23 1448  Mobility  Activity  (Bed Level UE Exercises)  Range of Motion/Exercises Active;Left arm;Right arm  Activity Response Tolerated well  Mobility Referral Yes  $Mobility charge 1 Mobility  Mobility Specialist Start Time (ACUTE ONLY) 0224  Mobility Specialist Stop Time (ACUTE ONLY) 0247  Mobility Specialist Time Calculation (min) (ACUTE ONLY) 23 min   Pt received in bed and agreeable to do bed level exercises. No complaints during session. See below for exercises.   Supine BUE Exercises: 10 reps each,  dark green & red  resistance band  1) Elbow Flexion  2) Elbow extension   3) Horizontal Abduction              4) Stress Ball Squeezes   [employer]

## 2023-01-02 DIAGNOSIS — N493 Fournier gangrene: Secondary | ICD-10-CM | POA: Diagnosis not present

## 2023-01-02 LAB — CBC WITH DIFFERENTIAL/PLATELET
Abs Immature Granulocytes: 0.29 10*3/uL — ABNORMAL HIGH (ref 0.00–0.07)
Basophils Absolute: 0.1 10*3/uL (ref 0.0–0.1)
Basophils Relative: 0 %
Eosinophils Absolute: 0.4 10*3/uL (ref 0.0–0.5)
Eosinophils Relative: 3 %
HCT: 25.1 % — ABNORMAL LOW (ref 39.0–52.0)
Hemoglobin: 8.2 g/dL — ABNORMAL LOW (ref 13.0–17.0)
Immature Granulocytes: 2 %
Lymphocytes Relative: 11 %
Lymphs Abs: 1.6 10*3/uL (ref 0.7–4.0)
MCH: 27.4 pg (ref 26.0–34.0)
MCHC: 32.7 g/dL (ref 30.0–36.0)
MCV: 83.9 fL (ref 80.0–100.0)
Monocytes Absolute: 0.9 10*3/uL (ref 0.1–1.0)
Monocytes Relative: 7 %
Neutro Abs: 10.6 10*3/uL — ABNORMAL HIGH (ref 1.7–7.7)
Neutrophils Relative %: 77 %
Platelets: 352 10*3/uL (ref 150–400)
RBC: 2.99 MIL/uL — ABNORMAL LOW (ref 4.22–5.81)
RDW: 22.2 % — ABNORMAL HIGH (ref 11.5–15.5)
WBC: 13.8 10*3/uL — ABNORMAL HIGH (ref 4.0–10.5)
nRBC: 0.3 % — ABNORMAL HIGH (ref 0.0–0.2)

## 2023-01-02 MED ORDER — RIVAROXABAN 10 MG PO TABS
10.0000 mg | ORAL_TABLET | Freq: Every day | ORAL | Status: DC
  Administered 2023-01-02 – 2023-01-07 (×5): 10 mg via ORAL
  Filled 2023-01-02 (×5): qty 1

## 2023-01-02 NOTE — Progress Notes (Signed)
4 Days Post-Op   Subjective/Chief Complaint:  1 - Neurogenic Bladder - Pt wtih T8 hemiplegia due to invasive multiple myeloma with subsequenst areflexic neurogenic bladder. SPT placed 03/2013 (57F silicone) and changed Q2 Weeks by [facility]. Also irrigates PRN to reduce sediment.   Recent Summarized Surveillance:  04/2022 - Cr 0.74 , Renal US w/o stones or hydro   2 - Fournier's Gangrene - s/p OR debridement 12/25/22 for very early necrotizing infectgion of scrotal area.  Second look, thigh pouch creation, and partial closure on 12/29/2022.  Objective: Vital signs in last 24 hours: Temp:  [98 F (36.7 C)-99.5 F (37.5 C)] 98.6 F (37 C) (06/11 0609) Pulse Rate:  [79-89] 89 (06/11 0609) Resp:  [16-20] 16 (06/11 0609) BP: (123-133)/(72-82) 123/72 (06/11 0609) SpO2:  [95 %-100 %] 97 % (06/11 0609) Last BM Date : 01/01/23  Intake/Output from previous day: 06/10 0701 - 06/11 0700 In: 240 [P.O.:240] Out: 3700 [Urine:3000; Stool:700] Intake/Output this shift: No intake/output data recorded.  EXAM: NAD, In good spirits as always, very pleasant. Pulm: Non-labored breathign on minimal [place] O2 Stable truncal obesity GU: Scrotal defect is largely closed with loose suturing.  2 small openings at bottom of scrotum.  No erythema or purulent drainage. SPT with non-foul urine.  MSK: Stable LE contracures  c/w paraplegia  Lab Results:  Recent Labs    01/01/23 0625 01/02/23 0557  WBC 11.9* 13.8*  HGB 7.9* 8.2*  HCT 24.5* 25.1*  PLT 379 352   BMET Recent Labs    12/31/22 0620  NA 143  K 3.5  CL 110  CO2 26  GLUCOSE 112*  BUN 15  CREATININE 0.60*  CALCIUM 8.3*   PT/INR No results for input(s): "LABPROT", "INR" in the last 72 hours. ABG No results for input(s): "PHART", "HCO3" in the last 72 hours.  Invalid input(s): "PCO2", "PO2"  Studies/Results: No results found.  Anti-infectives: Anti-infectives (From admission, onward)    Start     Dose/Rate Route Frequency  Ordered Stop   12/28/22 1800  doxycycline (VIBRA-TABS) tablet 100 mg        100 mg Oral Every 12 hours 12/28/22 1456 01/08/23 2359   12/28/22 1545  amoxicillin-clavulanate (AUGMENTIN) 875-125 MG per tablet 1 tablet        1 tablet Oral Every 12 hours 12/28/22 1456 01/08/23 2359   12/25/22 1730  linezolid (ZYVOX) IVPB 600 mg  Status:  Discontinued        600 mg 300 mL/hr over 60 Minutes Intravenous 2 times daily 12/25/22 1630 12/28/22 1456   12/25/22 1730  piperacillin-tazobactam (ZOSYN) IVPB 3.375 g  Status:  Discontinued        3.375 g 12.5 mL/hr over 240 Minutes Intravenous Every 8 hours 12/25/22 1630 12/28/22 1456   12/25/22 0300  vancomycin (VANCOCIN) IVPB 1000 mg/200 mL premix  Status:  Discontinued        1,000 mg 200 mL/hr over 60 Minutes Intravenous Every 12 hours 12/24/22 1344 12/25/22 1630   12/24/22 1430  vancomycin (VANCOREADY) IVPB 2000 mg/400 mL        2,000 mg 200 mL/hr over 120 Minutes Intravenous  Once 12/24/22 1342 12/24/22 1624   12/23/22 0000  ceFEPIme (MAXIPIME) 2 g in sodium chloride 0.9 % 100 mL IVPB  Status:  Discontinued        2 g 200 mL/hr over 30 Minutes Intravenous Every 8 hours 12/22/22 1501 12/22/22 1559   12/22/22 2200  acyclovir (ZOVIRAX) tablet 400 mg  400 mg Oral 2 times daily 12/22/22 1632     12/22/22 1800  cefTRIAXone (ROCEPHIN) 2 g in sodium chloride 0.9 % 100 mL IVPB  Status:  Discontinued        2 g 200 mL/hr over 30 Minutes Intravenous Every 24 hours 12/22/22 1559 12/25/22 1630   12/22/22 1530  ceFEPIme (MAXIPIME) 2 g in sodium chloride 0.9 % 100 mL IVPB  Status:  Discontinued        2 g 200 mL/hr over 30 Minutes Intravenous  Once 12/22/22 1436 12/22/22 1559       Assessment/Plan:  S/p significant scrotal debridement of necrotic tissue with Dr. Lafonda Mosses on 12/25/2022.  Taken back for second look, creation of thigh pouches, and partial closure by Dr. Berneice Heinrich on 12/29/2022.  Sinus had drained at time of surgery and there was no fluid to  collect in the OR.  ID following.  Transition from Zosyn to Doxy/Augmentin.  Treatment to continue through 6/16  Of note, when probing it feels as though the bottom of the wound is palpable at around the 7 o'clock position.  Rotating to around the 8 o'clock position falls into another tract that is around 6 to 7 cm deep.  Completed wound care with nursing today.  Contacted APP managing his care on an outpatient basis this morning.  She and the wife will accompany me for wound care education tomorrow morning for the next.  No change to current plan  Scherrie Bateman [place] 01/02/2023

## 2023-01-02 NOTE — TOC Progression Note (Signed)
Transition of Care [facility]) - Progression Note    Patient Details  Name: [name] MRN: [id number] Date of Birth: Nov 21, 1955  Transition of Care [facility]) CM/SW Contact  Beckie Busing, RN Phone Number:[phone]  01/02/2023, 9:28 PM  Clinical Narrative:    TOC continues to follow in order to collaborate disposition planning with [facility]. Wife to receive wound care education on Wed 6/12. [name] will transport patient home when ready. Please contact Mamie Levers [phone].    Expected Discharge Plan: Home w Home Health Services Barriers to Discharge: Continued Medical Work up  Expected Discharge Plan and Services In-house Referral: NA Discharge Planning Services: CM Consult Post Acute Care Choice: NA                   DME Arranged: N/A DME Agency: NA       HH Arranged: PCS/Personal Care Services (all services contracted through [facility])           Social Determinants of Health (SDOH) Interventions SDOH Screenings   Food Insecurity: No Food Insecurity (12/22/2022)  Housing: Low Risk  (12/22/2022)  Transportation Needs: No Transportation Needs (12/22/2022)  Utilities: Not At Risk (12/22/2022)  Tobacco Use: Low Risk  (12/30/2022)    Readmission Risk Interventions    12/26/2022    3:45 PM 12/26/2022    3:25 PM  Readmission Risk Prevention Plan  Post Dischage Appt Complete Complete  Medication Screening Complete Complete  Transportation Screening Complete Complete

## 2023-01-02 NOTE — Progress Notes (Signed)
Mobility Specialist - Progress Note   01/02/23 1111  Mobility  Activity  (Bed Level Exercises)  Range of Motion/Exercises Active;Left arm;Right arm  Activity Response Tolerated well  Mobility Referral Yes  $Mobility charge 1 Mobility  Mobility Specialist Start Time (ACUTE ONLY) 1054  Mobility Specialist Stop Time (ACUTE ONLY) 1111  Mobility Specialist Time Calculation (min) (ACUTE ONLY) 17 min   Pt received in bed and agreeable do bed level exercises. No complaints during session. See below for exercises.   Supine BUE Exercises: 20 reps each,  dark green & yellow resistance band  1) Elbow Flexion  2) Elbow extension   3) Horizontal Abduction             4) Stress Ball Squeezes   [employer]

## 2023-01-02 NOTE — Progress Notes (Signed)
Mobility Specialist - Progress Note   01/02/23 1611  Mobility  Activity  (Bed Level Exercises)  Range of Motion/Exercises Active;Left arm;Right arm  Activity Response Tolerated well  Mobility Referral Yes  $Mobility charge 1 Mobility  Mobility Specialist Start Time (ACUTE ONLY) 0352  Mobility Specialist Stop Time (ACUTE ONLY) 0411  Mobility Specialist Time Calculation (min) (ACUTE ONLY) 19 min   Pt received in bed and agreeable to do bed level exercises. No complaints during session. See below for exercises.   Supine BUE Exercises: 20 reps each,  red % dark green  resistance band  1) Elbow Flexion  2) Elbow extension   3) Horizontal Abduction             4) Stress Ball Squeezes  [employer]

## 2023-01-02 NOTE — Progress Notes (Signed)
[facility] Progress Note  Patient: [name]     ZOX:[id number]  DOA: 12/22/2022   PCP: Jethro Bastos, MD       Brief hospital course: This is a 67 year old male with adrenal insufficiency, history of DVT, history of lower GI bleed, paraplegic with no sensation below the abdomen, neurogenic bladder with suprapubic catheter, colostomy, multiple myeloma with metastasis who was sent from the cancer center due to fever, tachycardia, hypotension and a WBC count of 26.7 despite levofloxacin. He was initially admitted to the hospital for a UTI and was started on ceftriaxone.  WBC count was noted to improve slightly. 6/2-noted to have scrotal drainage.  Urology consulted and noted that the patient had an abscess with necrotic borders. 6/3-underwent debridement of necrotic tissue and given a diagnosis of scrotal abscess with foreign years gangrene -Antibiotics transitioned to Zosyn and Zyvox  Subjective:  No complaints.   Assessment and Plan: Principal Problem: UTI and Fournier's gangrene of scrotum-in immunocompromised patient -Status post extensive scrotal debridement and now with exposed testicles -  given PIP tazobactam and Zyvox initially - Urine culture reveals Proteus mirabilis which is resistant to ciprofloxacin and nitrofurantoin -Blood culture negative - ID recommended Doxy and Augmentin until 6/17- appreciate input -WBC 14> 19> 20>11.9> 13.8 - Transposition of testicles to thigh and partial wound closure completed on 6/7 and most of the wound is now closed-scrotal packing being done daily  - stable to dc once we are able to obtain daily wound care- have ordered [facility] - Wife and APP at [facility] outpt clinic where he goes 4 x a week will be educated by Urology tomorrow morning- If APP cannot be present tomorrow, urology plans to teach her on Thursday - May need SNF for wound care if cannot get daily wound care at home  - Active Problems:    Multiple myeloma in  relapse -Originally diagnosed in 2011 - Currently receiving Kyprolis every 2 weeks since June 2023 -Continue acyclovir  Microcytic, iron deficiency anemia - 1 U PRBC given on 6/6 - microcytosis resolved- cont oral Iron - Hgb stable ~ 8  Mild Thrombocytosis - resolved    T8 Paraplegia (HCC)   Neurogenic bladder   Neurogenic bowel -Patient has a suprapubic catheter and a colostomy    History of DVT (deep vein thrombosis) -Status post IVC filter and managed with Xarelto - Oncology recommends indefinite treatment with Xarelto due to the patient's bedbound status -Xarelto resumed at 10 mg daily    Morbid obesity Body mass index is 36.81 kg/m.       Code Status: Full Code Consultants: Urology Level of Care: Level of care: Med-Surg Total time on patient care: 35 minutes DVT prophylaxis:  Objective:   Vitals:   01/01/23 1424 01/01/23 2035 01/02/23 0609 01/02/23 1344  BP: 133/82 131/77 123/72 112/66  Pulse: 79 86 89 97  Resp: 20 16 16 20   Temp: 98 F (36.7 C) 99.5 F (37.5 C) 98.6 F (37 C) 98.6 F (37 C)  TempSrc: Oral Oral Oral Oral  SpO2: 100% 95% 97% 98%  Weight:      Height:       Filed Weights   12/22/22 1500 12/25/22 1258 12/29/22 1429  Weight: 109.8 kg 109.8 kg 109.8 kg   Exam: General exam: Appears comfortable  HEENT: oral mucosa moist Respiratory system: Clear to auscultation.  Cardiovascular system: S1 & S2 heard  Gastrointestinal system: Abdomen soft, non-tender, nondistended. Normal bowel sounds   GU: wounds noted in  scrotum with packing in place Extremities: No cyanosis, clubbing or edema Psychiatry:  Mood & affect appropriate.      CBC: Recent Labs  Lab 12/29/22 0608 12/30/22 0643 12/31/22 0620 01/01/23 0625 01/02/23 0557  WBC 19.7* 20.8* 13.4* 11.9* 13.8*  NEUTROABS 15.8* 19.0* 10.1* 8.4* 10.6*  HGB 8.9* 8.4* 8.1* 7.9* 8.2*  HCT 27.0* 26.1* 24.8* 24.5* 25.1*  MCV 82.6 83.9 83.8 83.9 83.9  PLT 388 391 406* 379 352    Basic  Metabolic Panel: Recent Labs  Lab 12/27/22 0519 12/31/22 0620  NA 144 143  K 3.9 3.5  CL 110 110  CO2 26 26  GLUCOSE 98 112*  BUN 8 15  CREATININE 0.67 0.60*  CALCIUM 8.8* 8.3*  MG 1.8  --     GFR: Estimated Creatinine Clearance: 106.3 mL/min (A) (by C-G formula based on SCr of 0.6 mg/dL (L)).  Scheduled Meds:  acyclovir  400 mg Oral BID   amoxicillin-clavulanate  1 tablet Oral Q12H   atorvastatin  20 mg Oral Daily   cholecalciferol  1,000 Units Oral Daily   dorzolamide-timolol  1 drop Both Eyes BID   doxycycline  100 mg Oral Q12H   feeding supplement (GLUCERNA SHAKE)  237 mL Oral BID BM   iron polysaccharides  150 mg Oral Daily   latanoprost  1 drop Both Eyes QHS   multivitamin with minerals  1 tablet Oral Daily   nutrition supplement (JUVEN)  1 packet Oral BID BM   rivaroxaban  10 mg Oral Q supper   zinc oxide  1 Application Topical BID   Continuous Infusions:   Imaging and lab data was personally reviewed No results found.  LOS: 11 days   Author: Calvert Cantor  01/02/2023 2:48 PM  To contact [facility]>   Check the care team in [facility] and look for the attending/consulting [facility] provider listed  Log into [email or web address] and use [place]'s universal password   Go to> "[facility]"  and find provider  If you still have difficulty reaching the provider, please page the [facility] (Director on Call) for the Hospitalists listed on amion

## 2023-01-03 ENCOUNTER — Telehealth: Payer: Self-pay | Admitting: *Deleted

## 2023-01-03 DIAGNOSIS — N493 Fournier gangrene: Secondary | ICD-10-CM | POA: Diagnosis not present

## 2023-01-03 LAB — CBC WITH DIFFERENTIAL/PLATELET
Abs Immature Granulocytes: 0.2 10*3/uL — ABNORMAL HIGH (ref 0.00–0.07)
Basophils Absolute: 0 10*3/uL (ref 0.0–0.1)
Basophils Relative: 0 %
Eosinophils Absolute: 0.4 10*3/uL (ref 0.0–0.5)
Eosinophils Relative: 2 %
HCT: 25.4 % — ABNORMAL LOW (ref 39.0–52.0)
Hemoglobin: 8.3 g/dL — ABNORMAL LOW (ref 13.0–17.0)
Immature Granulocytes: 1 %
Lymphocytes Relative: 8 %
Lymphs Abs: 1.5 10*3/uL (ref 0.7–4.0)
MCH: 27.3 pg (ref 26.0–34.0)
MCHC: 32.7 g/dL (ref 30.0–36.0)
MCV: 83.6 fL (ref 80.0–100.0)
Monocytes Absolute: 0.9 10*3/uL (ref 0.1–1.0)
Monocytes Relative: 5 %
Neutro Abs: 16.5 10*3/uL — ABNORMAL HIGH (ref 1.7–7.7)
Neutrophils Relative %: 84 %
Platelets: 338 10*3/uL (ref 150–400)
RBC: 3.04 MIL/uL — ABNORMAL LOW (ref 4.22–5.81)
RDW: 22.2 % — ABNORMAL HIGH (ref 11.5–15.5)
WBC: 19.6 10*3/uL — ABNORMAL HIGH (ref 4.0–10.5)
nRBC: 0 % (ref 0.0–0.2)

## 2023-01-03 NOTE — Progress Notes (Signed)
Elmon Kirschner NP paged to notify of pt's spouse and APP here waiting for [place]'s arrival to educate on wound care.

## 2023-01-03 NOTE — Progress Notes (Signed)
[facility] Progress Note  Patient: [name]     ZOX:[id number]  DOA: 12/22/2022   PCP: Jethro Bastos, MD       Brief hospital course: This is a 67 year old male with adrenal insufficiency, history of DVT, history of lower GI bleed, paraplegic with no sensation below the abdomen, neurogenic bladder with suprapubic catheter, colostomy, multiple myeloma with metastasis who was sent from the cancer center due to fever, tachycardia, hypotension and a WBC count of 26.7 despite levofloxacin. He was initially admitted to the hospital for a UTI and was started on ceftriaxone.  WBC count was noted to improve slightly.   6/2-noted to have scrotal drainage. Urology consulted and noted that the patient had an abscess with necrotic borders. 6/3-underwent debridement of necrotic tissue and given a diagnosis of scrotal abscess with foreign years gangrene - Antibiotics transitioned to Zosyn and Zyvox 6/12 -lengthy discussion with wife, she continues education to provide bedside care for her husband.  First educational discussion was this morning, plan is to have additional teaching on the 13th and 14th.  She is hopeful that by Friday the 14th she will be more comfortable and understand her role and patient's needs prior to discharge home.  Subjective:  No complaints.   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 -Hypothermic, tachycardic with leukocytosis at intake with notable sources of infection including UTI and Fournier's gangrene  - Status post extensive scrotal debridement and now with exposed testicles - Given PIP tazobactam and Zyvox initially - Urine culture reveals Proteus mirabilis which is resistant to ciprofloxacin and nitrofurantoin - Blood culture negative - ID recommended Doxy and Augmentin through 6/17- appreciate input - WBC somewhat labile but generally downtrending 14> 19> 20>11.9> 13.8 -  Transposition of testicles to thigh and partial wound closure completed on 6/7 and most of the wound is now closed-scrotal packing being done daily  - Stable to dc once we are able to obtain appropriate daily wound care- have ordered [facility] and wife continues to participate in education for care of her husband's postsurgical needs - Wife and APP completed education this morning, plan is for additional teaching the morning of the 13th and 14th.  -If wife is unable to safely care for patient's postsurgical needs, short stay at nursing facility would not be unreasonable.  Microcytic, iron deficiency anemia - Status post 1 unit PRBC 12/28/2022 -Hemoglobin stable around 8 - Microcytosis resolved- cont oral iron supplementation  Mild thrombocytosis - resolved  T8 Paraplegia (HCC) Neurogenic bladder Neurogenic bowel -Chronic, bedbound at baseline, patient has a suprapubic catheter and a colostomy; patient is managed quite well at home  Multiple myeloma, in relapse -Originally diagnosed in 2011 - Currently receiving Kyprolis every 2 weeks since June 2023 -Continue acyclovir  History of DVT (deep vein thrombosis) - Status post IVC filter and managed with Xarelto - Oncology recommends indefinite treatment with Xarelto due to the patient's bedbound status  Morbid obesity Body mass index is 36.81 kg/m.     Code Status: Full Code Consultants: Urology Level of Care: Level of care: Med-Surg Total time on patient care: 35 minutes DVT prophylaxis:  Objective:   Vitals:   01/02/23 0609 01/02/23 1344 01/02/23 2056 01/03/23 0449  BP: 123/72 112/66 122/80 118/72  Pulse: 89 97 88 94  Resp: 16 20  16   Temp: 98.6 F (37 C) 98.6 F (37 C) 98.7 F (37.1 C) 98.3 F (36.8 C)  TempSrc: Oral Oral  Oral Oral  SpO2: 97% 98% 97% 99%  Weight:      Height:       Filed Weights   12/22/22 1500 12/25/22 1258 12/29/22 1429  Weight: 109.8 kg 109.8 kg 109.8 kg   Exam:  General exam: Resting comfortably   HEENT: oral mucosa moist Respiratory system: Clear to auscultation.  Cardiovascular system: S1 & S2 heard  Gastrointestinal system: Abdomen soft, non-tender, nondistended. Normal bowel sounds; ostomy clean dry intact GU: wounds noted in scrotum with packing in place Extremities: No cyanosis, clubbing or edema Psychiatry:  Mood & affect appropriate.   CBC: Recent Labs  Lab 12/30/22 0643 12/31/22 0620 01/01/23 0625 01/02/23 0557 01/03/23 0557  WBC 20.8* 13.4* 11.9* 13.8* 19.6*  NEUTROABS 19.0* 10.1* 8.4* 10.6* 16.5*  HGB 8.4* 8.1* 7.9* 8.2* 8.3*  HCT 26.1* 24.8* 24.5* 25.1* 25.4*  MCV 83.9 83.8 83.9 83.9 83.6  PLT 391 406* 379 352 338    Basic Metabolic Panel: Recent Labs  Lab 12/31/22 0620  NA 143  K 3.5  CL 110  CO2 26  GLUCOSE 112*  BUN 15  CREATININE 0.60*  CALCIUM 8.3*    GFR: Estimated Creatinine Clearance: 106.3 mL/min (A) (by C-G formula based on SCr of 0.6 mg/dL (L)).  Scheduled Meds:  acyclovir  400 mg Oral BID   amoxicillin-clavulanate  1 tablet Oral Q12H   atorvastatin  20 mg Oral Daily   cholecalciferol  1,000 Units Oral Daily   dorzolamide-timolol  1 drop Both Eyes BID   doxycycline  100 mg Oral Q12H   feeding supplement (GLUCERNA SHAKE)  237 mL Oral BID BM   iron polysaccharides  150 mg Oral Daily   latanoprost  1 drop Both Eyes QHS   multivitamin with minerals  1 tablet Oral Daily   nutrition supplement (JUVEN)  1 packet Oral BID BM   rivaroxaban  10 mg Oral Q supper   zinc oxide  1 Application Topical BID   Continuous Infusions:   Imaging and lab data was personally reviewed No results found.  LOS: 12 days   Author: Azucena Fallen  01/03/2023 1:47 PM

## 2023-01-03 NOTE — Progress Notes (Signed)
5 Days Post-Op   Subjective/Chief Complaint:  1 - Neurogenic Bladder - Pt wtih T8 hemiplegia due to invasive multiple myeloma with subsequenst areflexic neurogenic bladder. SPT placed 03/2013 (42F silicone) and changed Q2 Weeks by [facility]. Also irrigates PRN to reduce sediment.   Recent Summarized Surveillance:  04/2022 - Cr 0.74 , Renal US w/o stones or hydro   2 - Fournier's Gangrene - s/p OR debridement 12/25/22 for very early necrotizing infectgion of scrotal area.  Second look, thigh pouch creation, and partial closure on 12/29/2022.  Objective: Vital signs in last 24 hours: Temp:  [98.3 F (36.8 C)-98.7 F (37.1 C)] 98.3 F (36.8 C) (06/12 0449) Pulse Rate:  [88-97] 94 (06/12 0449) Resp:  [16-20] 16 (06/12 0449) BP: (112-122)/(66-80) 118/72 (06/12 0449) SpO2:  [97 %-99 %] 99 % (06/12 0449) Last BM Date : 01/02/23  Intake/Output from previous day: 06/11 0701 - 06/12 0700 In: 240 [P.O.:240] Out: 1600 [Urine:1600] Intake/Output this shift: Total I/O In: 237 [P.O.:237] Out: -   EXAM: NAD, In good spirits as always, very pleasant. Pulm: Non-labored breathign on minimal [place] O2 Stable truncal obesity GU: Scrotal defect is largely closed with loose suturing.  2 small openings at bottom of scrotum.  No erythema or purulent drainage. SPT with non-foul urine.  MSK: Stable LE contracures  c/w paraplegia  Lab Results:  Recent Labs    01/02/23 0557 01/03/23 0557  WBC 13.8* 19.6*  HGB 8.2* 8.3*  HCT 25.1* 25.4*  PLT 352 338   BMET No results for input(s): "NA", "K", "CL", "CO2", "GLUCOSE", "BUN", "CREATININE", "CALCIUM" in the last 72 hours.  PT/INR No results for input(s): "LABPROT", "INR" in the last 72 hours. ABG No results for input(s): "PHART", "HCO3" in the last 72 hours.  Invalid input(s): "PCO2", "PO2"  Studies/Results: No results found.  Anti-infectives: Anti-infectives (From admission, onward)    Start     Dose/Rate Route Frequency Ordered Stop    12/28/22 1800  doxycycline (VIBRA-TABS) tablet 100 mg        100 mg Oral Every 12 hours 12/28/22 1456 01/08/23 2359   12/28/22 1545  amoxicillin-clavulanate (AUGMENTIN) 875-125 MG per tablet 1 tablet        1 tablet Oral Every 12 hours 12/28/22 1456 01/08/23 2359   12/25/22 1730  linezolid (ZYVOX) IVPB 600 mg  Status:  Discontinued        600 mg 300 mL/hr over 60 Minutes Intravenous 2 times daily 12/25/22 1630 12/28/22 1456   12/25/22 1730  piperacillin-tazobactam (ZOSYN) IVPB 3.375 g  Status:  Discontinued        3.375 g 12.5 mL/hr over 240 Minutes Intravenous Every 8 hours 12/25/22 1630 12/28/22 1456   12/25/22 0300  vancomycin (VANCOCIN) IVPB 1000 mg/200 mL premix  Status:  Discontinued        1,000 mg 200 mL/hr over 60 Minutes Intravenous Every 12 hours 12/24/22 1344 12/25/22 1630   12/24/22 1430  vancomycin (VANCOREADY) IVPB 2000 mg/400 mL        2,000 mg 200 mL/hr over 120 Minutes Intravenous  Once 12/24/22 1342 12/24/22 1624   12/23/22 0000  ceFEPIme (MAXIPIME) 2 g in sodium chloride 0.9 % 100 mL IVPB  Status:  Discontinued        2 g 200 mL/hr over 30 Minutes Intravenous Every 8 hours 12/22/22 1501 12/22/22 1559   12/22/22 2200  acyclovir (ZOVIRAX) tablet 400 mg        400 mg Oral 2 times daily 12/22/22  1632     12/22/22 1800  cefTRIAXone (ROCEPHIN) 2 g in sodium chloride 0.9 % 100 mL IVPB  Status:  Discontinued        2 g 200 mL/hr over 30 Minutes Intravenous Every 24 hours 12/22/22 1559 12/25/22 1630   12/22/22 1530  ceFEPIme (MAXIPIME) 2 g in sodium chloride 0.9 % 100 mL IVPB  Status:  Discontinued        2 g 200 mL/hr over 30 Minutes Intravenous  Once 12/22/22 1436 12/22/22 1559       Assessment/Plan:  S/p significant scrotal debridement of necrotic tissue with Dr. Lafonda Mosses on 12/25/2022.  Taken back for second look, creation of thigh pouches, and partial closure by Dr. Berneice Heinrich on 12/29/2022.  Sinus had drained at time of surgery and there was no fluid to collect in the  OR.  ID following.  Transition from Zosyn to Doxy/Augmentin.  Treatment to continue through 6/16  Of note, when probing it feels as though the bottom of the wound is palpable at around the 7 o'clock position.  Rotating to around the 8 o'clock position falls into another tract that is around 6 to 7 cm deep.  Completed wound care with nursing today.  His primary care [facility] and his wife both accompanied me this morning when I walked him for wound care and medical team took a video with his permission.  The wife will return tomorrow to practice again and his outpatient medical provider has requested 24 hours to procure necessary supplies.  Following that patient is okay to discharge from urologic perspective.  We will have him follow-up in clinic within about 1 week.  Wound care every 24 hours.  Wet-to-dry dressing Would benefit from home health assessing his wound a couple times a week    Scherrie Bateman Nollan Muldrow 01/03/2023

## 2023-01-03 NOTE — Progress Notes (Signed)
Mobility Specialist - Progress Note   01/03/23 1353  Mobility  Activity  (Bed Level Exercises)  Range of Motion/Exercises Active;Left arm;Right arm  Activity Response Tolerated well  Mobility Referral Yes  $Mobility charge 1 Mobility  Mobility Specialist Stop Time (ACUTE ONLY) 0153   Pt received in bed and agreeable to Bed Level Exercises. No complaints during session. See below for exercises.   Supine BUE Exercises: 10 reps each,  dark green & red  resistance band  1) Elbow Flexion  2) Elbow extension   3) Horizontal Abduction              4) Stress Ball Squeezes  [employer]

## 2023-01-03 NOTE — Telephone Encounter (Signed)
TCT [facility] regarding his upcoming appts. Spoke with [place], APP @ [facility]. Advised that Dr. Leonides Schanz is canceling pt's next appt on this Friday, 01/05/23 to allow more healing time for pt's scrotal debridement and testicular transposition due to scrotal gangrene. Advised that his next appt is now 01/26/23 with labs, clinic visit and his Kyprolis. [place] appreciated the call she will notify scheduling and also have his wife contacted about the appt cancellation. Attempted call to wife but no answer.

## 2023-01-04 DIAGNOSIS — N493 Fournier gangrene: Secondary | ICD-10-CM | POA: Diagnosis not present

## 2023-01-04 LAB — CBC WITH DIFFERENTIAL/PLATELET
Abs Immature Granulocytes: 0.12 10*3/uL — ABNORMAL HIGH (ref 0.00–0.07)
Basophils Absolute: 0 10*3/uL (ref 0.0–0.1)
Basophils Relative: 0 %
Eosinophils Absolute: 0.3 10*3/uL (ref 0.0–0.5)
Eosinophils Relative: 2 %
HCT: 26.7 % — ABNORMAL LOW (ref 39.0–52.0)
Hemoglobin: 8.8 g/dL — ABNORMAL LOW (ref 13.0–17.0)
Immature Granulocytes: 1 %
Lymphocytes Relative: 7 %
Lymphs Abs: 1.2 10*3/uL (ref 0.7–4.0)
MCH: 27.8 pg (ref 26.0–34.0)
MCHC: 33 g/dL (ref 30.0–36.0)
MCV: 84.5 fL (ref 80.0–100.0)
Monocytes Absolute: 0.9 10*3/uL (ref 0.1–1.0)
Monocytes Relative: 5 %
Neutro Abs: 14.8 10*3/uL — ABNORMAL HIGH (ref 1.7–7.7)
Neutrophils Relative %: 85 %
Platelets: 378 10*3/uL (ref 150–400)
RBC: 3.16 MIL/uL — ABNORMAL LOW (ref 4.22–5.81)
RDW: 22.4 % — ABNORMAL HIGH (ref 11.5–15.5)
WBC: 17.4 10*3/uL — ABNORMAL HIGH (ref 4.0–10.5)
nRBC: 0 % (ref 0.0–0.2)

## 2023-01-04 NOTE — Progress Notes (Signed)
Mobility Specialist - Progress Note   01/04/23 1036  Mobility  Activity  (Bed Level Exercises)  Range of Motion/Exercises Active;Left arm;Right arm  Activity Response Tolerated well  Mobility Referral Yes  $Mobility charge 1 Mobility  Mobility Specialist Start Time (ACUTE ONLY) 1014  Mobility Specialist Stop Time (ACUTE ONLY) 1035  Mobility Specialist Time Calculation (min) (ACUTE ONLY) 21 min   Pt received in bed and agreeable to do UE bed level exercises. No complaints during session. See below for exercises.   Supine BUE Exercises: 20 reps each,  dark green & red  resistance band  1) Elbow Flexion  2) Elbow extension   3) Horizontal Abduction             4) Stress Ball Squeezes  [employer]

## 2023-01-04 NOTE — Plan of Care (Addendum)
Patient alert and oriented X4, Wife came in for 8am wound care teaching with NP Sheria Lang. NP paged and unable to message d/t off line status. This RN offered to show wife and [facility] NP at the bedside demonstration of wound packing with normal saline soaked Kerlix and abdominal pad & both agreed to it. RN covered tunneling and three sections of packing in perineum and showed wife how to do it. Wife had to step out in the middle of teaching to go to another appointment. Talked to NP Sheria Lang & told him wife wanted to do wound care with him before getting discharged.  Problem: Education: Goal: Knowledge of General Education information will improve Description: Including pain rating scale, medication(s)/side effects and non-pharmacologic comfort measures Outcome: Progressing   Problem: Health Behavior/Discharge Planning: Goal: Ability to manage health-related needs will improve Outcome: Progressing   Problem: Clinical Measurements: Goal: Ability to maintain clinical measurements within normal limits will improve Outcome: Progressing Goal: Will remain free from infection Outcome: Progressing Goal: Diagnostic test results will improve Outcome: Progressing Goal: Respiratory complications will improve Outcome: Progressing Goal: Cardiovascular complication will be avoided Outcome: Progressing   Problem: Activity: Goal: Risk for activity intolerance will decrease Outcome: Progressing   Problem: Nutrition: Goal: Adequate nutrition will be maintained Outcome: Progressing   Problem: Coping: Goal: Level of anxiety will decrease Outcome: Progressing   Problem: Elimination: Goal: Will not experience complications related to bowel motility Outcome: Progressing Goal: Will not experience complications related to urinary retention Outcome: Progressing   Problem: Pain Managment: Goal: General experience of comfort will improve Outcome: Progressing   Problem: Safety: Goal: Ability to remain free  from injury will improve Outcome: Progressing   Problem: Skin Integrity: Goal: Risk for impaired skin integrity will decrease Outcome: Progressing   Problem: Education: Goal: Ability to describe self-care measures that may prevent or decrease complications (Diabetes Survival Skills Education) will improve Outcome: Progressing Goal: Individualized Educational Video(s) Outcome: Progressing   Problem: Coping: Goal: Ability to adjust to condition or change in health will improve Outcome: Progressing   Problem: Fluid Volume: Goal: Ability to maintain a balanced intake and output will improve Outcome: Progressing   Problem: Health Behavior/Discharge Planning: Goal: Ability to identify and utilize available resources and services will improve Outcome: Progressing Goal: Ability to manage health-related needs will improve Outcome: Progressing   Problem: Metabolic: Goal: Ability to maintain appropriate glucose levels will improve Outcome: Progressing   Problem: Nutritional: Goal: Maintenance of adequate nutrition will improve Outcome: Progressing Goal: Progress toward achieving an optimal weight will improve Outcome: Progressing   Problem: Skin Integrity: Goal: Risk for impaired skin integrity will decrease Outcome: Progressing   Problem: Tissue Perfusion: Goal: Adequacy of tissue perfusion will improve Outcome: Progressing

## 2023-01-04 NOTE — Progress Notes (Signed)
Mobility Specialist - Progress Note   01/04/23 1520  Mobility  Activity  (Bed level Exercises)  Range of Motion/Exercises Active;Left arm;Right arm  Activity Response Tolerated well  Mobility Referral Yes  $Mobility charge 1 Mobility  Mobility Specialist Start Time (ACUTE ONLY) 0255  Mobility Specialist Stop Time (ACUTE ONLY) 0319  Mobility Specialist Time Calculation (min) (ACUTE ONLY) 24 min   Pt received in bed and agreeable to do UE Bed Level Exercises. No complaints during session. See below for exercises.   Supine BUE Exercises: 20 reps each,  dark green & red resistance band  1) Elbow Flexion  2) Elbow extension   3) Horizontal Abduction             4) Stress Ball Squeezes  [employer]

## 2023-01-04 NOTE — Progress Notes (Signed)
[facility] Progress Note  Patient: [name]     GNF:[id number]  DOA: 12/22/2022   PCP: Jethro Bastos, MD       Brief hospital course: This is a 67 year old male with adrenal insufficiency, history of DVT, history of lower GI bleed, paraplegic with no sensation below the abdomen, neurogenic bladder with suprapubic catheter, colostomy, multiple myeloma with metastasis who was sent from the cancer center due to fever, tachycardia, hypotension and a WBC count of 26.7 despite levofloxacin. He was initially admitted to the hospital for a UTI and was started on ceftriaxone.  WBC count was noted to improve slightly.   6/2-noted to have scrotal drainage. Urology consulted and noted that the patient had an abscess with necrotic borders. 6/3-underwent debridement of necrotic tissue and given a diagnosis of scrotal abscess with foreign years gangrene - Antibiotics transitioned to Zosyn and Zyvox 6/12 -lengthy discussion with wife, she continues education to provide bedside care for her husband.  First educational discussion was this morning, plan is to have additional teaching on the 13th and 14th. She is hopeful that by Friday the 14th she will be more comfortable and understand her role and patient's needs prior to discharge home.  Subjective:  No acute issues or events overnight, patient feels quite well, hopeful for discharge home once wife is comfortable managing his care.   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 -Hypothermic, tachycardic with leukocytosis at intake with notable sources of infection including UTI and Fournier's gangrene  - Status post extensive scrotal debridement and now with exposed testicles - Given PIP tazobactam and Zyvox initially - Urine culture reveals Proteus mirabilis which is resistant to ciprofloxacin and nitrofurantoin - Blood culture negative - ID recommended Doxy and  Augmentin through 6/17- appreciate input - WBC somewhat labile but generally downtrending 14> 19> 20>11.9> 13.8 - Transposition of testicles to thigh and partial wound closure completed on 6/7 and most of the wound is now closed-scrotal packing being done daily  - Stable to dc once we are able to obtain appropriate daily wound care- have ordered [facility] and wife continues to participate in education for care of her husband's postsurgical needs - Wife and APP completed education 12th -no one was available the morning of the 13th for education, this may delay discharge.  Previous plan was for additional teaching on the morning of the 13th and 14th. -If wife is unable to safely care for patient's postsurgical needs, short stay at nursing facility would not be unreasonable.  Microcytic, iron deficiency anemia - Status post 1 unit PRBC 12/28/2022 -Hemoglobin stable around 8 - Microcytosis resolved- cont oral iron supplementation  Mild thrombocytosis - resolved  T8 Paraplegia (HCC) Neurogenic bladder Neurogenic bowel -Chronic, bedbound at baseline, patient has a suprapubic catheter and a colostomy; patient is managed quite well at home  Multiple myeloma, in relapse -Originally diagnosed in 2011 - Currently receiving Kyprolis every 2 weeks since June 2023 -Continue acyclovir  History of DVT (deep vein thrombosis) - Status post IVC filter and managed with Xarelto - Oncology recommends indefinite treatment with Xarelto due to the patient's bedbound status  Morbid obesity Body mass index is 36.81 kg/m.     Code Status: Full Code Consultants: Urology Level of Care: Level of care: Med-Surg Total time on patient care: 35 minutes DVT prophylaxis:  Objective:   Vitals:   01/02/23 2056 01/03/23 0449 01/03/23 1413 01/04/23 0502  BP: 122/80 118/72 116/74 121/68  Pulse: 88 94 75 93  Resp:  16 20 16   Temp: 98.7 F (37.1 C) 98.3 F (36.8 C) 97.9 F (36.6 C) 98.9 F (37.2 C)  TempSrc: Oral  Oral Oral Oral  SpO2: 97% 99% 93% 96%  Weight:      Height:       Filed Weights   12/22/22 1500 12/25/22 1258 12/29/22 1429  Weight: 109.8 kg 109.8 kg 109.8 kg   Exam:  General exam: Resting comfortably  HEENT: oral mucosa moist Respiratory system: Clear to auscultation.  Cardiovascular system: S1 & S2 heard  Gastrointestinal system: Abdomen soft, non-tender, nondistended. Normal bowel sounds; ostomy clean dry intact GU: wounds noted in scrotum with packing in place Extremities: No cyanosis, clubbing or edema Psychiatry:  Mood & affect appropriate.   CBC: Recent Labs  Lab 12/30/22 0643 12/31/22 0620 01/01/23 0625 01/02/23 0557 01/03/23 0557  WBC 20.8* 13.4* 11.9* 13.8* 19.6*  NEUTROABS 19.0* 10.1* 8.4* 10.6* 16.5*  HGB 8.4* 8.1* 7.9* 8.2* 8.3*  HCT 26.1* 24.8* 24.5* 25.1* 25.4*  MCV 83.9 83.8 83.9 83.9 83.6  PLT 391 406* 379 352 338    Basic Metabolic Panel: Recent Labs  Lab 12/31/22 0620  NA 143  K 3.5  CL 110  CO2 26  GLUCOSE 112*  BUN 15  CREATININE 0.60*  CALCIUM 8.3*    GFR: Estimated Creatinine Clearance: 106.3 mL/min (A) (by C-G formula based on SCr of 0.6 mg/dL (L)).  Scheduled Meds:  acyclovir  400 mg Oral BID   amoxicillin-clavulanate  1 tablet Oral Q12H   atorvastatin  20 mg Oral Daily   cholecalciferol  1,000 Units Oral Daily   dorzolamide-timolol  1 drop Both Eyes BID   doxycycline  100 mg Oral Q12H   feeding supplement (GLUCERNA SHAKE)  237 mL Oral BID BM   iron polysaccharides  150 mg Oral Daily   latanoprost  1 drop Both Eyes QHS   multivitamin with minerals  1 tablet Oral Daily   nutrition supplement (JUVEN)  1 packet Oral BID BM   rivaroxaban  10 mg Oral Q supper   zinc oxide  1 Application Topical BID   Continuous Infusions:   Imaging and lab data was personally reviewed No results found.  LOS: 13 days   Author: Azucena Fallen  01/04/2023 7:17 AM

## 2023-01-04 NOTE — Progress Notes (Signed)
6 Days Post-Op   Subjective/Chief Complaint:  1 - Neurogenic Bladder - Pt wtih T8 hemiplegia due to invasive multiple myeloma with subsequenst areflexic neurogenic bladder. SPT placed 03/2013 (34F silicone) and changed Q2 Weeks by [facility]. Also irrigates PRN to reduce sediment.   Recent Summarized Surveillance:  04/2022 - Cr 0.74 , Renal US w/o stones or hydro   2 - Fournier's Gangrene - s/p OR debridement 12/25/22 for very early necrotizing infectgion of scrotal area.  Second look, thigh pouch creation, and partial closure on 12/29/2022.  Objective: Vital signs in last 24 hours: Temp:  [97.9 F (36.6 C)-98.9 F (37.2 C)] 98.9 F (37.2 C) (06/13 0502) Pulse Rate:  [75-93] 93 (06/13 0502) Resp:  [16-20] 16 (06/13 0502) BP: (116-121)/(68-74) 121/68 (06/13 0502) SpO2:  [93 %-96 %] 96 % (06/13 0502) Last BM Date : 01/04/23  Intake/Output from previous day: 06/12 0701 - 06/13 0700 In: 357 [P.O.:357] Out: 1250 [Urine:1250] Intake/Output this shift: Total I/O In: -  Out: 550 [Urine:550]  EXAM: NAD, In good spirits as always, very pleasant. Pulm: Non-labored breathign on minimal [place] O2 Stable truncal obesity GU: Scrotal defect is largely closed with loose suturing.  2 small openings at bottom of scrotum.  No erythema or purulent drainage. SPT with non-foul urine.  MSK: Stable LE contracures  c/w paraplegia  Lab Results:  Recent Labs    01/03/23 0557 01/04/23 0558  WBC 19.6* 17.4*  HGB 8.3* 8.8*  HCT 25.4* 26.7*  PLT 338 378   BMET No results for input(s): "NA", "K", "CL", "CO2", "GLUCOSE", "BUN", "CREATININE", "CALCIUM" in the last 72 hours.  PT/INR No results for input(s): "LABPROT", "INR" in the last 72 hours. ABG No results for input(s): "PHART", "HCO3" in the last 72 hours.  Invalid input(s): "PCO2", "PO2"  Studies/Results: No results found.  Anti-infectives: Anti-infectives (From admission, onward)    Start     Dose/Rate Route Frequency Ordered Stop    12/28/22 1800  doxycycline (VIBRA-TABS) tablet 100 mg        100 mg Oral Every 12 hours 12/28/22 1456 01/08/23 2359   12/28/22 1545  amoxicillin-clavulanate (AUGMENTIN) 875-125 MG per tablet 1 tablet        1 tablet Oral Every 12 hours 12/28/22 1456 01/08/23 2359   12/25/22 1730  linezolid (ZYVOX) IVPB 600 mg  Status:  Discontinued        600 mg 300 mL/hr over 60 Minutes Intravenous 2 times daily 12/25/22 1630 12/28/22 1456   12/25/22 1730  piperacillin-tazobactam (ZOSYN) IVPB 3.375 g  Status:  Discontinued        3.375 g 12.5 mL/hr over 240 Minutes Intravenous Every 8 hours 12/25/22 1630 12/28/22 1456   12/25/22 0300  vancomycin (VANCOCIN) IVPB 1000 mg/200 mL premix  Status:  Discontinued        1,000 mg 200 mL/hr over 60 Minutes Intravenous Every 12 hours 12/24/22 1344 12/25/22 1630   12/24/22 1430  vancomycin (VANCOREADY) IVPB 2000 mg/400 mL        2,000 mg 200 mL/hr over 120 Minutes Intravenous  Once 12/24/22 1342 12/24/22 1624   12/23/22 0000  ceFEPIme (MAXIPIME) 2 g in sodium chloride 0.9 % 100 mL IVPB  Status:  Discontinued        2 g 200 mL/hr over 30 Minutes Intravenous Every 8 hours 12/22/22 1501 12/22/22 1559   12/22/22 2200  acyclovir (ZOVIRAX) tablet 400 mg        400 mg Oral 2 times daily 12/22/22  1632     12/22/22 1800  cefTRIAXone (ROCEPHIN) 2 g in sodium chloride 0.9 % 100 mL IVPB  Status:  Discontinued        2 g 200 mL/hr over 30 Minutes Intravenous Every 24 hours 12/22/22 1559 12/25/22 1630   12/22/22 1530  ceFEPIme (MAXIPIME) 2 g in sodium chloride 0.9 % 100 mL IVPB  Status:  Discontinued        2 g 200 mL/hr over 30 Minutes Intravenous  Once 12/22/22 1436 12/22/22 1559       Assessment/Plan:  S/p significant scrotal debridement of necrotic tissue with Dr. Lafonda Mosses on 12/25/2022.  Taken back for second look, creation of thigh pouches, and partial closure by Dr. Berneice Heinrich on 12/29/2022.  Sinus had drained at time of surgery and there was no fluid to collect in the  OR.  ID following.  Transition from Zosyn to Doxy/Augmentin.  Treatment to continue through 6/16  Of note, when probing it feels as though the bottom of the wound is palpable at around the 7 o'clock position.  Rotating to around the 8 o'clock position falls into another tract that is around 6 to 7 cm deep.    Wound care every 24 hours.  Wet-to-dry dressing  Would benefit from home health assessing his wound a couple times a week  His primary nurse and wife presented to the hospital for practice with wound care again today.  I also reviewed expectations with nursing.  This is a deep area heading towards his perineum keeps being neglected.  Otherwise I feel that he is okay to discharge from urologic perspective.  He will follow-up with [place] closely in clinic.  Expecting around 1 week    Scherrie Bateman Li Bobo 01/04/2023

## 2023-01-05 ENCOUNTER — Ambulatory Visit: Payer: Medicare (Managed Care)

## 2023-01-05 ENCOUNTER — Other Ambulatory Visit: Payer: Medicare (Managed Care)

## 2023-01-05 DIAGNOSIS — N493 Fournier gangrene: Secondary | ICD-10-CM | POA: Diagnosis not present

## 2023-01-05 LAB — CBC WITH DIFFERENTIAL/PLATELET
Abs Immature Granulocytes: 0.11 10*3/uL — ABNORMAL HIGH (ref 0.00–0.07)
Basophils Absolute: 0 10*3/uL (ref 0.0–0.1)
Basophils Relative: 0 %
Eosinophils Absolute: 0.3 10*3/uL (ref 0.0–0.5)
Eosinophils Relative: 2 %
HCT: 25.9 % — ABNORMAL LOW (ref 39.0–52.0)
Hemoglobin: 8.5 g/dL — ABNORMAL LOW (ref 13.0–17.0)
Immature Granulocytes: 1 %
Lymphocytes Relative: 7 %
Lymphs Abs: 1.1 10*3/uL (ref 0.7–4.0)
MCH: 27.4 pg (ref 26.0–34.0)
MCHC: 32.8 g/dL (ref 30.0–36.0)
MCV: 83.5 fL (ref 80.0–100.0)
Monocytes Absolute: 0.9 10*3/uL (ref 0.1–1.0)
Monocytes Relative: 6 %
Neutro Abs: 12.3 10*3/uL — ABNORMAL HIGH (ref 1.7–7.7)
Neutrophils Relative %: 84 %
Platelets: 340 10*3/uL (ref 150–400)
RBC: 3.1 MIL/uL — ABNORMAL LOW (ref 4.22–5.81)
RDW: 22.1 % — ABNORMAL HIGH (ref 11.5–15.5)
WBC: 14.8 10*3/uL — ABNORMAL HIGH (ref 4.0–10.5)
nRBC: 0 % (ref 0.0–0.2)

## 2023-01-05 NOTE — Progress Notes (Signed)
Mobility Specialist - Progress Note   01/05/23 1115  Mobility  Activity  (Bed Level Exercises)  Range of Motion/Exercises Active;Left arm;Right arm  Activity Response Tolerated well  Mobility Referral Yes  $Mobility charge 1 Mobility  Mobility Specialist Start Time (ACUTE ONLY) 1102  Mobility Specialist Stop Time (ACUTE ONLY) 1113  Mobility Specialist Time Calculation (min) (ACUTE ONLY) 11 min   Pt received in bed and agreeable to do bed level exercises. No complaints during session. See below for exercises.   Supine BUE Exercises: 20 reps each,  dark green & red resistance band  1) Elbow Flexion  2) Elbow extension   3) Horizontal Abduction             4) Stress Ball Squeezes  [employer]

## 2023-01-05 NOTE — Progress Notes (Signed)
Mobility Specialist - Progress Note   01/05/23 1415  Mobility  Range of Motion/Exercises Active;Left arm;Right arm  Activity Response Tolerated well  Mobility Referral Yes  $Mobility charge 1 Mobility  Mobility Specialist Start Time (ACUTE ONLY) 0154  Mobility Specialist Stop Time (ACUTE ONLY) 0215  Mobility Specialist Time Calculation (min) (ACUTE ONLY) 21 min   Pt received in bed and agreeable to do bed level exercises. No complaints during session. See below for exercises.   Supine BUE Exercises: 20 reps each,  dark green & red resistance band  1) Elbow Flexion  2) Elbow extension   3) Horizontal Abduction             4) Stress Ball Squeezes  [employer]

## 2023-01-05 NOTE — Progress Notes (Signed)
[facility] Progress Note  Patient: [name]     ZOX:[id number]  DOA: 12/22/2022   PCP: Jethro Bastos, MD       Brief hospital course: This is a 67 year old male with adrenal insufficiency, history of DVT, history of lower GI bleed, paraplegic with no sensation below the abdomen, neurogenic bladder with suprapubic catheter, colostomy, multiple myeloma with metastasis who was sent from the cancer center due to fever, tachycardia, hypotension and a WBC count of 26.7 despite levofloxacin. He was initially admitted to the hospital for a UTI and was started on ceftriaxone.  WBC count was noted to improve slightly.   6/2-noted to have scrotal drainage. Urology consulted and noted that the patient had an abscess with necrotic borders. 6/3-underwent debridement of necrotic tissue and given a diagnosis of scrotal abscess with foreign years gangrene - Antibiotics transitioned to Zosyn and Zyvox 6/12 -lengthy discussion with wife, she continues education to provide bedside care for her husband.  First educational discussion was this morning, plan is to have additional teaching on the 13th and 14th. She is hopeful that by Friday the 14th she will be more comfortable and understand her role and patient's needs prior to discharge home.  Patient remains medically stable for discharge, wife continues to request ongoing education in regards to patient's wound care and medical needs -however she apparently left the hospital in the middle of educational setting on the 13th.  Unclear if she is appropriate to care for patient's medical needs at home.  She is now requesting to have an additional educational event Monday, 01/08/2023 despite stepping out of the most recent educational event, unclear if patient was present today for education.  Patient has been medically stable for discharge now for over 48 hours.  We discussed that if wife is not comfortable caring for the patient at home transition to  rehab facility would certainly be reasonable until such time as his medical needs are not as intensive.  Subjective:  No acute issues or events overnight, patient feels quite well, hopeful for discharge home once wife is comfortable managing his care.   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 -Hypothermic, tachycardic with leukocytosis at intake with notable sources of infection including UTI and Fournier's gangrene  - Status post extensive scrotal debridement and now with exposed testicles - Given PIP tazobactam and Zyvox initially - Urine culture reveals Proteus mirabilis which is resistant to ciprofloxacin and nitrofurantoin - Blood culture negative - ID recommended Doxy and Augmentin through 6/17- appreciate input - WBC somewhat labile but generally downtrending 14> 19> 20>11.9> 13.8 - Transposition of testicles to thigh and partial wound closure completed on 6/7 and most of the wound is now closed-scrotal packing being done daily  - Stable to dc once we are able to obtain appropriate daily wound care- have ordered [facility] and wife continues to participate in education for care of her husband's postsurgical needs - Wife and APP completed education 12th -no one was available the morning of the 13th for education, this may delay discharge.  Previous plan was for additional teaching on the morning of the 13th and 14th. -If wife is unable to safely care for patient's postsurgical needs, short stay at nursing facility would not be unreasonable.  Microcytic, iron deficiency anemia - Status post 1 unit PRBC 12/28/2022 -Hemoglobin stable around 8 - Microcytosis resolved- cont oral iron supplementation  Mild thrombocytosis - resolved  T8 Paraplegia (HCC) Neurogenic  bladder Neurogenic bowel -Chronic, bedbound at baseline, patient has a suprapubic catheter and a colostomy; patient is managed quite well at home  Multiple  myeloma, in relapse -Originally diagnosed in 2011 - Currently receiving Kyprolis every 2 weeks since June 2023 -Continue acyclovir  History of DVT (deep vein thrombosis) - Status post IVC filter and managed with Xarelto - Oncology recommends indefinite treatment with Xarelto due to the patient's bedbound status  Morbid obesity Body mass index is 36.81 kg/m.     Code Status: Full Code Consultants: Urology Level of Care: Level of care: Med-Surg Total time on patient care: 35 minutes DVT prophylaxis:  Objective:   Vitals:   01/04/23 1333 01/04/23 2039 01/05/23 0511 01/05/23 1416  BP: 124/79 117/74 119/68 122/74  Pulse: 89 87 88 84  Resp:  16 18   Temp: 98.3 F (36.8 C) 98.7 F (37.1 C) 98.2 F (36.8 C) (!) 97.5 F (36.4 C)  TempSrc: Oral Oral Oral Oral  SpO2: 98% 95% 98% 100%  Weight:      Height:       Filed Weights   12/22/22 1500 12/25/22 1258 12/29/22 1429  Weight: 109.8 kg 109.8 kg 109.8 kg   Exam:  General exam: Resting comfortably  HEENT: oral mucosa moist Respiratory system: Clear to auscultation.  Cardiovascular system: S1 & S2 heard  Gastrointestinal system: Abdomen soft, non-tender, nondistended. Normal bowel sounds; ostomy clean dry intact GU: wounds noted in scrotum with packing in place Extremities: No cyanosis, clubbing or edema Psychiatry:  Mood & affect appropriate.   CBC: Recent Labs  Lab 01/01/23 0625 01/02/23 0557 01/03/23 0557 01/04/23 0558 01/05/23 0602  WBC 11.9* 13.8* 19.6* 17.4* 14.8*  NEUTROABS 8.4* 10.6* 16.5* 14.8* 12.3*  HGB 7.9* 8.2* 8.3* 8.8* 8.5*  HCT 24.5* 25.1* 25.4* 26.7* 25.9*  MCV 83.9 83.9 83.6 84.5 83.5  PLT 379 352 338 378 340    Basic Metabolic Panel: Recent Labs  Lab 12/31/22 0620  NA 143  K 3.5  CL 110  CO2 26  GLUCOSE 112*  BUN 15  CREATININE 0.60*  CALCIUM 8.3*    GFR: Estimated Creatinine Clearance: 106.3 mL/min (A) (by C-G formula based on SCr of 0.6 mg/dL (L)).  Scheduled Meds:  acyclovir   400 mg Oral BID   amoxicillin-clavulanate  1 tablet Oral Q12H   atorvastatin  20 mg Oral Daily   cholecalciferol  1,000 Units Oral Daily   dorzolamide-timolol  1 drop Both Eyes BID   doxycycline  100 mg Oral Q12H   feeding supplement (GLUCERNA SHAKE)  237 mL Oral BID BM   iron polysaccharides  150 mg Oral Daily   latanoprost  1 drop Both Eyes QHS   multivitamin with minerals  1 tablet Oral Daily   nutrition supplement (JUVEN)  1 packet Oral BID BM   rivaroxaban  10 mg Oral Q supper   zinc oxide  1 Application Topical BID   Continuous Infusions:   Imaging and lab data was personally reviewed No results found.  LOS: 14 days   Author: Azucena Fallen  01/05/2023 3:03 PM

## 2023-01-05 NOTE — TOC Progression Note (Signed)
Transition of Care [facility]) - Progression Note    Patient Details  Name: [name] MRN: [id number] Date of Birth: Nov 01, 1955  [facility]) CM/SW Contact  Beckie Busing, RN Phone Number:[phone]  01/05/2023, 2:13 PM  Clinical Narrative:    CM following for disposition planning. RN reports that wife has not been able to do return demonstration. CM spoke with Marylene Land at [facility] who confirms that that patients wife will need to be able to show return demonstration because she will definitely be responsible for dressing changes on the weekends and other times during the week when [name] is not able to assist. Nursing  will need to collaborate with wife on dressing change teaching. Please document teaching with return demonstration. TOC will continue to follow for discharge appropriateness.    Expected Discharge Plan: Home w Home Health Services Barriers to Discharge: Continued Medical Work up  Expected Discharge Plan and Services In-house Referral: NA Discharge Planning Services: CM Consult Post Acute Care Choice: NA                   DME Arranged: N/A DME Agency: NA       HH Arranged: [facility]/Personal Care Services (all [facility])           Social Determinants of Health (SDOH) Interventions SDOH Screenings   Food Insecurity: No Food Insecurity (12/22/2022)  Housing: Low Risk  (12/22/2022)  Transportation Needs: No Transportation Needs (12/22/2022)  Utilities: Not At Risk (12/22/2022)  Tobacco Use: Low Risk  (12/30/2022)    Readmission Risk Interventions    12/26/2022    3:45 PM 12/26/2022    3:25 PM  Readmission Risk Prevention Plan  Post Dischage Appt Complete Complete  Medication Screening Complete Complete  Transportation Screening Complete Complete

## 2023-01-06 DIAGNOSIS — N493 Fournier gangrene: Secondary | ICD-10-CM | POA: Diagnosis not present

## 2023-01-06 LAB — CBC WITH DIFFERENTIAL/PLATELET
Abs Immature Granulocytes: 0.25 10*3/uL — ABNORMAL HIGH (ref 0.00–0.07)
Basophils Absolute: 0 10*3/uL (ref 0.0–0.1)
Basophils Relative: 0 %
Eosinophils Absolute: 0.3 10*3/uL (ref 0.0–0.5)
Eosinophils Relative: 2 %
HCT: 26.2 % — ABNORMAL LOW (ref 39.0–52.0)
Hemoglobin: 8.4 g/dL — ABNORMAL LOW (ref 13.0–17.0)
Immature Granulocytes: 2 %
Lymphocytes Relative: 9 %
Lymphs Abs: 1.2 10*3/uL (ref 0.7–4.0)
MCH: 26.7 pg (ref 26.0–34.0)
MCHC: 32.1 g/dL (ref 30.0–36.0)
MCV: 83.2 fL (ref 80.0–100.0)
Monocytes Absolute: 0.9 10*3/uL (ref 0.1–1.0)
Monocytes Relative: 7 %
Neutro Abs: 10.8 10*3/uL — ABNORMAL HIGH (ref 1.7–7.7)
Neutrophils Relative %: 80 %
Platelets: 361 10*3/uL (ref 150–400)
RBC: 3.15 MIL/uL — ABNORMAL LOW (ref 4.22–5.81)
RDW: 22 % — ABNORMAL HIGH (ref 11.5–15.5)
WBC: 13.4 10*3/uL — ABNORMAL HIGH (ref 4.0–10.5)
nRBC: 0 % (ref 0.0–0.2)

## 2023-01-06 NOTE — TOC Progression Note (Addendum)
Transition of Care [facility]) - Progression Note    Patient Details  Name: [name] MRN: [id number] Date of Birth: 1955/08/19  Transition [facility]) CM/SW Contact  Adrian Prows, RN Phone Number: 01/06/2023, 1:42 PM  Clinical Narrative:    Notified pt ready for d/c; LVM for [name] at [facility] [phone]) to see if transportation can be arranged on weekends; awaiting return call; also called agency main # and message states agency in closed until Monday; contacted [name] [facility] supervisor and she says pt can be transported by [facility]; pt's wife [name] successfully completed return demonstration of wound care; contacted her to discuss pt's d/c home; she says she is not comfortable completing dressing changes and a nurse not being available; she would like for Dr Natale Milch to call her; MD notified via phone; he will call pt's wife to discuss.  -1407- orders received for [facility] for dressing changes and aide; spoke w/ Lacie Scotts [phone]) and she will have home care supervisor contact this CM; awaiting return call for clarification of d/c plans from [facility].  -1416- call back from [place]; she says the supervisor who arranges this is not available; she also says arrangements must be arranged by [facility] to insure contracted agency is used; Dr Natale Milch and wife notified; awaiting final disposition.  -1424- pt's wife notified [facility] will make arrangements for [facility] services; she also confirmed understanding HHRN dressing change arrangements will be per [facility] arrangements;  waiting final disposition.  Expected Discharge Plan: Home w Home Health Services Barriers to Discharge: Continued Medical Work up  Expected Discharge Plan and Services In-house Referral: NA Discharge Planning Services: CM Consult Post Acute Care Choice: NA                   DME Arranged: N/A DME Agency: NA       HH Arranged: [facility]/Personal Care Services (all [facility])           Social Determinants of Health (SDOH) Interventions SDOH Screenings   Food Insecurity: No Food Insecurity (12/22/2022)  Housing: Low Risk  (12/22/2022)  Transportation Needs: No Transportation Needs (12/22/2022)  Utilities: Not At Risk (12/22/2022)  Tobacco Use: Low Risk  (12/30/2022)    Readmission Risk Interventions    12/26/2022    3:45 PM 12/26/2022    3:25 PM  Readmission Risk Prevention Plan  Post Dischage Appt Complete Complete  Medication Screening Complete Complete  Transportation Screening Complete Complete

## 2023-01-06 NOTE — Plan of Care (Signed)
Patients wound care order completed by wife with 2 nurse techs, and RN present, wife packed wound with soaked kerlix using a sterile qtip to assess for areas of tunneling. Abdominal pad was used to cover wound. Wife demonstrated wound care, understood order, and will continue to practice wound care.   Problem: Education: Goal: Knowledge of General Education information will improve Description: Including pain rating scale, medication(s)/side effects and non-pharmacologic comfort measures Outcome: Progressing   Problem: Health Behavior/Discharge Planning: Goal: Ability to manage health-related needs will improve Outcome: Progressing   Problem: Clinical Measurements: Goal: Ability to maintain clinical measurements within normal limits will improve Outcome: Progressing Goal: Will remain free from infection Outcome: Progressing Goal: Diagnostic test results will improve Outcome: Progressing Goal: Respiratory complications will improve Outcome: Progressing Goal: Cardiovascular complication will be avoided Outcome: Progressing   Problem: Activity: Goal: Risk for activity intolerance will decrease Outcome: Progressing   Problem: Nutrition: Goal: Adequate nutrition will be maintained Outcome: Progressing   Problem: Coping: Goal: Level of anxiety will decrease Outcome: Progressing   Problem: Elimination: Goal: Will not experience complications related to bowel motility Outcome: Progressing Goal: Will not experience complications related to urinary retention Outcome: Progressing   Problem: Pain Managment: Goal: General experience of comfort will improve Outcome: Progressing   Problem: Safety: Goal: Ability to remain free from injury will improve Outcome: Progressing   Problem: Skin Integrity: Goal: Risk for impaired skin integrity will decrease Outcome: Progressing   Problem: Education: Goal: Ability to describe self-care measures that may prevent or decrease complications  (Diabetes Survival Skills Education) will improve Outcome: Progressing Goal: Individualized Educational Video(s) Outcome: Progressing   Problem: Coping: Goal: Ability to adjust to condition or change in health will improve Outcome: Progressing   Problem: Fluid Volume: Goal: Ability to maintain a balanced intake and output will improve Outcome: Progressing   Problem: Health Behavior/Discharge Planning: Goal: Ability to identify and utilize available resources and services will improve Outcome: Progressing Goal: Ability to manage health-related needs will improve Outcome: Progressing   Problem: Metabolic: Goal: Ability to maintain appropriate glucose levels will improve Outcome: Progressing   Problem: Nutritional: Goal: Maintenance of adequate nutrition will improve Outcome: Progressing Goal: Progress toward achieving an optimal weight will improve Outcome: Progressing   Problem: Skin Integrity: Goal: Risk for impaired skin integrity will decrease Outcome: Progressing   Problem: Tissue Perfusion: Goal: Adequacy of tissue perfusion will improve Outcome: Progressing

## 2023-01-06 NOTE — Progress Notes (Signed)
[facility] Progress Note  Patient: [name]     ZOX:[id number]  DOA: 12/22/2022   PCP: Jethro Bastos, MD       Brief hospital course: This is a 67 year old male with adrenal insufficiency, history of DVT, history of lower GI bleed, paraplegic with no sensation below the abdomen, neurogenic bladder with suprapubic catheter, colostomy, multiple myeloma with metastasis who was sent from the cancer center due to fever, tachycardia, hypotension and a WBC count of 26.7 despite levofloxacin. He was initially admitted to the hospital for a UTI and was started on ceftriaxone.  WBC count was noted to improve slightly.   6/2-noted to have scrotal drainage. Urology consulted and noted that the patient had an abscess with necrotic borders. 6/3-underwent debridement of necrotic tissue and given a diagnosis of scrotal abscess with foreign years gangrene - Antibiotics transitioned to Zosyn and Zyvox 6/12 -lengthy discussion with wife, she continues education to provide bedside care for her husband.  First educational discussion was this morning, plan is to have additional teaching on the 13th and 14th. She is hopeful that by Friday the 14th she will be more comfortable and understand her role and patient's needs prior to discharge home.  Patient remains medically stable for discharge, wife continues to request ongoing education in regards to patient's wound care and medical needs -however she apparently left the hospital in the middle of educational session on the 13th.  Unclear if she is appropriate to care for patient's medical needs at home.  She is now requesting to have an additional educational session Monday, 01/08/2023. Hoping to have multiple teaching events this weekend. Patient has been medically stable for discharge now since 01/04/23. We discussed that if wife is not comfortable caring for the patient at home transition to rehab facility would certainly be reasonable until such time  as his medical needs are not as intensive.  Subjective:  No acute issues or events overnight, patient feels quite well, hopeful for discharge home once wife is comfortable managing his care.   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 -Hypothermic, tachycardic with leukocytosis at intake with notable sources of infection including UTI and Fournier's gangrene  - Status post extensive scrotal debridement and now with exposed testicles - Given PIP tazobactam and Zyvox initially - Urine culture reveals Proteus mirabilis which is resistant to ciprofloxacin and nitrofurantoin - Blood culture negative - ID recommended Doxy and Augmentin through 6/17- appreciate input - WBC somewhat labile but generally downtrending 14> 19> 20>11.9> 13.8 - Transposition of testicles to thigh and partial wound closure completed on 6/7 and most of the wound is now closed-scrotal packing being done daily  - Stable to dc once we are able to obtain appropriate daily wound care- have ordered [facility] and wife continues to participate in education for care of her husband's postsurgical needs - Wife and APP completed education 12th -no one was available the morning of the 13th for education, this may delay discharge.  Previous plan was for additional teaching on the morning of the 13th and 14th. -If wife is unable to safely care for patient's postsurgical needs, short stay at nursing facility would not be unreasonable.  Microcytic, iron deficiency anemia - Status post 1 unit PRBC 12/28/2022 -Hemoglobin stable around 8 - Microcytosis resolved- cont oral iron supplementation  Mild thrombocytosis - resolved  T8 Paraplegia (HCC) Neurogenic bladder Neurogenic bowel -Chronic, bedbound at baseline, patient has a suprapubic catheter and  a colostomy; patient is managed quite well at home  Multiple myeloma, in relapse -Originally diagnosed in 2011 -  Currently receiving Kyprolis every 2 weeks since June 2023 -Continue acyclovir  History of DVT (deep vein thrombosis) - Status post IVC filter and managed with Xarelto - Oncology recommends indefinite treatment with Xarelto due to the patient's bedbound status  Morbid obesity Body mass index is 36.81 kg/m.     Code Status: Full Code Consultants: Urology Level of Care: Level of care: Med-Surg Total time on patient care: 35 minutes DVT prophylaxis:  Objective:   Vitals:   01/05/23 0511 01/05/23 1416 01/05/23 2103 01/06/23 0514  BP: 119/68 122/74 120/65 119/73  Pulse: 88 84 88 74  Resp: 18  16 14   Temp: 98.2 F (36.8 C) (!) 97.5 F (36.4 C) 99.2 F (37.3 C) 98.2 F (36.8 C)  TempSrc: Oral Oral Oral Oral  SpO2: 98% 100% 98% 99%  Weight:      Height:       Filed Weights   12/22/22 1500 12/25/22 1258 12/29/22 1429  Weight: 109.8 kg 109.8 kg 109.8 kg   Exam:  General exam: Resting comfortably  HEENT: oral mucosa moist Respiratory system: Clear to auscultation.  Cardiovascular system: S1 & S2 heard  Gastrointestinal system: Abdomen soft, non-tender, nondistended. Normal bowel sounds; ostomy clean dry intact GU: wounds noted in scrotum with packing in place Extremities: No cyanosis, clubbing or edema Psychiatry:  Mood & affect appropriate.   CBC: Recent Labs  Lab 01/01/23 0625 01/02/23 0557 01/03/23 0557 01/04/23 0558 01/05/23 0602  WBC 11.9* 13.8* 19.6* 17.4* 14.8*  NEUTROABS 8.4* 10.6* 16.5* 14.8* 12.3*  HGB 7.9* 8.2* 8.3* 8.8* 8.5*  HCT 24.5* 25.1* 25.4* 26.7* 25.9*  MCV 83.9 83.9 83.6 84.5 83.5  PLT 379 352 338 378 340    Basic Metabolic Panel: Recent Labs  Lab 12/31/22 0620  NA 143  K 3.5  CL 110  CO2 26  GLUCOSE 112*  BUN 15  CREATININE 0.60*  CALCIUM 8.3*    GFR: Estimated Creatinine Clearance: 106.3 mL/min (A) (by C-G formula based on SCr of 0.6 mg/dL (L)).  Scheduled Meds:  acyclovir  400 mg Oral BID   amoxicillin-clavulanate  1  tablet Oral Q12H   atorvastatin  20 mg Oral Daily   cholecalciferol  1,000 Units Oral Daily   dorzolamide-timolol  1 drop Both Eyes BID   doxycycline  100 mg Oral Q12H   feeding supplement (GLUCERNA SHAKE)  237 mL Oral BID BM   iron polysaccharides  150 mg Oral Daily   latanoprost  1 drop Both Eyes QHS   multivitamin with minerals  1 tablet Oral Daily   nutrition supplement (JUVEN)  1 packet Oral BID BM   rivaroxaban  10 mg Oral Q supper   zinc oxide  1 Application Topical BID   Continuous Infusions:   Imaging and lab data was personally reviewed No results found.  LOS: 15 days   Author: Azucena Fallen  01/06/2023 7:20 AM

## 2023-01-07 DIAGNOSIS — N493 Fournier gangrene: Secondary | ICD-10-CM | POA: Diagnosis not present

## 2023-01-07 LAB — CBC WITH DIFFERENTIAL/PLATELET
Abs Immature Granulocytes: 0.07 10*3/uL (ref 0.00–0.07)
Basophils Absolute: 0.1 10*3/uL (ref 0.0–0.1)
Basophils Relative: 0 %
Eosinophils Absolute: 0.3 10*3/uL (ref 0.0–0.5)
Eosinophils Relative: 2 %
HCT: 26.5 % — ABNORMAL LOW (ref 39.0–52.0)
Hemoglobin: 8.7 g/dL — ABNORMAL LOW (ref 13.0–17.0)
Immature Granulocytes: 1 %
Lymphocytes Relative: 10 %
Lymphs Abs: 1.4 10*3/uL (ref 0.7–4.0)
MCH: 27.4 pg (ref 26.0–34.0)
MCHC: 32.8 g/dL (ref 30.0–36.0)
MCV: 83.6 fL (ref 80.0–100.0)
Monocytes Absolute: 0.9 10*3/uL (ref 0.1–1.0)
Monocytes Relative: 7 %
Neutro Abs: 10.6 10*3/uL — ABNORMAL HIGH (ref 1.7–7.7)
Neutrophils Relative %: 80 %
Platelets: 354 10*3/uL (ref 150–400)
RBC: 3.17 MIL/uL — ABNORMAL LOW (ref 4.22–5.81)
RDW: 21.8 % — ABNORMAL HIGH (ref 11.5–15.5)
WBC: 13.2 10*3/uL — ABNORMAL HIGH (ref 4.0–10.5)
nRBC: 0 % (ref 0.0–0.2)

## 2023-01-07 NOTE — Progress Notes (Signed)
[facility] Progress Note  Patient: [name]     ZOX:[id number]  DOA: 12/22/2022   PCP: Jethro Bastos, MD       Brief hospital course: This is a 67 year old male with adrenal insufficiency, history of DVT, history of lower GI bleed, paraplegic with no sensation below the abdomen, neurogenic bladder with suprapubic catheter, colostomy, multiple myeloma with metastasis who was sent from the cancer center due to fever, tachycardia, hypotension and a WBC count of 26.7 despite levofloxacin. He was initially admitted to the hospital for a UTI and was started on ceftriaxone.  WBC count was noted to improve slightly.   6/2-noted to have scrotal drainage. Urology consulted and noted that the patient had an abscess with necrotic borders. 6/3-underwent debridement of necrotic tissue and given a diagnosis of scrotal abscess with foreign years gangrene - Antibiotics transitioned to Zosyn and Zyvox 6/12 -lengthy discussion with wife, she continues education to provide bedside care for her husband.  First educational discussion was this morning, plan is to have additional teaching on the 13th and 14th. She is hopeful that by Friday the 14th she will be more comfortable and understand her role and patient's needs prior to discharge home.  Patient remains medically stable for discharge, wife continues to request ongoing education in regards to patient's wound care and medical needs - however she reportedly left the hospital in the middle of educational session on the 13th further delaying discharge. Unclear if she is appropriate to care for patient's medical needs at home.  She is now requesting to have an additional educational session Monday, 01/08/2023. Hoping to have multiple teaching events this weekend. Patient has been medically stable for discharge now since 01/04/23. We discussed that if wife is not comfortable caring for the patient at home transition to rehab facility would certainly be  reasonable until such time as his medical needs are not as intensive.  Subjective:  No acute issues or events overnight, patient feels quite well, hopeful for discharge home once wife is comfortable managing his care.   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 -Hypothermic, tachycardic with leukocytosis at intake with notable sources of infection including UTI and Fournier's gangrene  - Status post extensive scrotal debridement and now with exposed testicles - Given PIP tazobactam and Zyvox initially - Urine culture reveals Proteus mirabilis which is resistant to ciprofloxacin and nitrofurantoin - Blood culture negative - ID recommended Doxy and Augmentin through 6/17- appreciate input - WBC somewhat labile but generally downtrending 14> 19> 20>11.9> 13.8 - Transposition of testicles to thigh and partial wound closure completed on 6/7 and most of the wound is now closed-scrotal packing being done daily  - Stable to dc once we are able to obtain appropriate daily wound care- have ordered [facility] and wife continues to participate in education for care of her husband's postsurgical needs - Wife and APP completed education 12th -no one was available the morning of the 13th for education, this may delay discharge.  Previous plan was for additional teaching on the morning of the 13th and 14th. -If wife is unable to safely care for patient's postsurgical needs, short stay at nursing facility would not be unreasonable.  Microcytic, iron deficiency anemia - Status post 1 unit PRBC 12/28/2022 -Hemoglobin stable around 8 - Microcytosis resolved- cont oral iron supplementation  Mild thrombocytosis - resolved  T8 Paraplegia (HCC) Neurogenic bladder Neurogenic bowel -Chronic, bedbound at baseline, patient has a  suprapubic catheter and a colostomy; patient is managed quite well at home  Multiple myeloma, in relapse -Originally  diagnosed in 2011 - Currently receiving Kyprolis every 2 weeks since June 2023 -Continue acyclovir  History of DVT (deep vein thrombosis) - Status post IVC filter and managed with Xarelto - Oncology recommends indefinite treatment with Xarelto due to the patient's bedbound status  Morbid obesity Body mass index is 36.81 kg/m.     Code Status: Full Code Consultants: Urology Level of Care: Level of care: Med-Surg Total time on patient care: 35 minutes DVT prophylaxis:  Objective:   Vitals:   01/06/23 1353 01/06/23 2036 01/07/23 0539 01/07/23 1331  BP: 118/74 118/68 121/75 121/76  Pulse: 85 84 92 75  Resp:  14 16 16   Temp: 97.7 F (36.5 C) 98.4 F (36.9 C) 99.3 F (37.4 C) 98.5 F (36.9 C)  TempSrc: Oral Oral Oral Oral  SpO2: 100% 97% 98% 100%  Weight:      Height:       Filed Weights   12/22/22 1500 12/25/22 1258 12/29/22 1429  Weight: 109.8 kg 109.8 kg 109.8 kg   Exam:  General exam: Resting comfortably  HEENT: oral mucosa moist Respiratory system: Clear to auscultation.  Cardiovascular system: S1 & S2 heard  Gastrointestinal system: Abdomen soft, non-tender, nondistended. Normal bowel sounds; ostomy clean dry intact GU: wounds noted in scrotum with packing in place Extremities: No cyanosis, clubbing or edema Psychiatry:  Mood & affect appropriate.   CBC: Recent Labs  Lab 01/03/23 0557 01/04/23 0558 01/05/23 0602 01/06/23 0651 01/07/23 0618  WBC 19.6* 17.4* 14.8* 13.4* 13.2*  NEUTROABS 16.5* 14.8* 12.3* 10.8* 10.6*  HGB 8.3* 8.8* 8.5* 8.4* 8.7*  HCT 25.4* 26.7* 25.9* 26.2* 26.5*  MCV 83.6 84.5 83.5 83.2 83.6  PLT 338 378 340 361 354    Basic Metabolic Panel: No results for input(s): "NA", "K", "CL", "CO2", "GLUCOSE", "BUN", "CREATININE", "CALCIUM", "MG", "PHOS" in the last 168 hours.  GFR: Estimated Creatinine Clearance: 106.3 mL/min (A) (by C-G formula based on SCr of 0.6 mg/dL (L)).  Scheduled Meds:  acyclovir  400 mg Oral BID    amoxicillin-clavulanate  1 tablet Oral Q12H   atorvastatin  20 mg Oral Daily   cholecalciferol  1,000 Units Oral Daily   dorzolamide-timolol  1 drop Both Eyes BID   doxycycline  100 mg Oral Q12H   feeding supplement (GLUCERNA SHAKE)  237 mL Oral BID BM   iron polysaccharides  150 mg Oral Daily   latanoprost  1 drop Both Eyes QHS   multivitamin with minerals  1 tablet Oral Daily   nutrition supplement (JUVEN)  1 packet Oral BID BM   rivaroxaban  10 mg Oral Q supper   zinc oxide  1 Application Topical BID   Continuous Infusions:   Imaging and lab data was personally reviewed No results found.  LOS: 16 days   Author: Azucena Fallen  01/07/2023 2:11 PM

## 2023-01-07 NOTE — Progress Notes (Signed)
Mobility Specialist - Progress Note   01/07/23 1459  Mobility  Activity  (Bed Level UE Exercises)  Range of Motion/Exercises Right arm;Left arm;Active  Activity Response Tolerated well  Mobility Referral Yes  $Mobility charge 1 Mobility  Mobility Specialist Start Time (ACUTE ONLY) 0236  Mobility Specialist Stop Time (ACUTE ONLY) 0257  Mobility Specialist Time Calculation (min) (ACUTE ONLY) 21 min   Pt received in bed and agreeable to do bed level exercises. No complaints during session. See below for exercises.   Supine BUE Exercises: 20 reps each,  dark green & red resistance band  1) Elbow Flexion  2) Elbow extension   3) Horizontal Abduction             4) Stretch Newman Pies Squeezes  [facility]

## 2023-01-07 NOTE — Progress Notes (Signed)
Mobility Specialist - Progress Note   01/07/23 1141  Mobility  Activity  (Bed Level Exercises)  Range of Motion/Exercises Right arm;Left arm;Active  Activity Response Tolerated well  Mobility Referral Yes  $Mobility charge 1 Mobility  Mobility Specialist Start Time (ACUTE ONLY) 1126  Mobility Specialist Stop Time (ACUTE ONLY) 1140  Mobility Specialist Time Calculation (min) (ACUTE ONLY) 14 min   Pt received in bed and agreeable to do UE Bed Level Exercises. No complaints during session. See below for exercises.   Supine BUE Exercises: 20 reps each,  dark green & red resistance band  1) Elbow Flexion  2) Elbow extension   3) Horizontal Abduction             4) Stress Ball Squeezes  [employer]

## 2023-01-08 ENCOUNTER — Other Ambulatory Visit (HOSPITAL_COMMUNITY): Payer: Self-pay

## 2023-01-08 ENCOUNTER — Encounter: Payer: Self-pay | Admitting: Hematology and Oncology

## 2023-01-08 DIAGNOSIS — N493 Fournier gangrene: Secondary | ICD-10-CM | POA: Diagnosis not present

## 2023-01-08 LAB — CBC WITH DIFFERENTIAL/PLATELET
Abs Immature Granulocytes: 0.08 10*3/uL — ABNORMAL HIGH (ref 0.00–0.07)
Basophils Absolute: 0.1 10*3/uL (ref 0.0–0.1)
Basophils Relative: 0 %
Eosinophils Absolute: 0.4 10*3/uL (ref 0.0–0.5)
Eosinophils Relative: 2 %
HCT: 26.9 % — ABNORMAL LOW (ref 39.0–52.0)
Hemoglobin: 8.7 g/dL — ABNORMAL LOW (ref 13.0–17.0)
Immature Granulocytes: 1 %
Lymphocytes Relative: 11 %
Lymphs Abs: 1.5 10*3/uL (ref 0.7–4.0)
MCH: 27 pg (ref 26.0–34.0)
MCHC: 32.3 g/dL (ref 30.0–36.0)
MCV: 83.5 fL (ref 80.0–100.0)
Monocytes Absolute: 0.9 10*3/uL (ref 0.1–1.0)
Monocytes Relative: 6 %
Neutro Abs: 11.6 10*3/uL — ABNORMAL HIGH (ref 1.7–7.7)
Neutrophils Relative %: 80 %
Platelets: 359 10*3/uL (ref 150–400)
RBC: 3.22 MIL/uL — ABNORMAL LOW (ref 4.22–5.81)
RDW: 21.5 % — ABNORMAL HIGH (ref 11.5–15.5)
WBC: 14.5 10*3/uL — ABNORMAL HIGH (ref 4.0–10.5)
nRBC: 0 % (ref 0.0–0.2)

## 2023-01-08 MED ORDER — DOXYCYCLINE HYCLATE 100 MG PO TABS
100.0000 mg | ORAL_TABLET | Freq: Two times a day (BID) | ORAL | 0 refills | Status: AC
  Filled 2023-01-08: qty 3, 2d supply, fill #0

## 2023-01-08 MED ORDER — ONDANSETRON HCL 4 MG PO TABS
4.0000 mg | ORAL_TABLET | Freq: Four times a day (QID) | ORAL | 0 refills | Status: DC | PRN
  Filled 2023-01-08: qty 20, 5d supply, fill #0

## 2023-01-08 MED ORDER — JUVEN PO PACK
1.0000 | PACK | Freq: Two times a day (BID) | ORAL | 0 refills | Status: DC
  Filled 2023-01-08: qty 60, 30d supply, fill #0

## 2023-01-08 MED ORDER — ATORVASTATIN CALCIUM 20 MG PO TABS
20.0000 mg | ORAL_TABLET | Freq: Every day | ORAL | 0 refills | Status: DC
  Filled 2023-01-08: qty 30, 30d supply, fill #0

## 2023-01-08 MED ORDER — ZINC OXIDE 11.3 % EX CREA
1.0000 | TOPICAL_CREAM | Freq: Two times a day (BID) | CUTANEOUS | 0 refills | Status: DC
  Filled 2023-01-08: qty 85, 43d supply, fill #0

## 2023-01-08 MED ORDER — MINERIN CREME EX CREA
1.0000 | TOPICAL_CREAM | Freq: Two times a day (BID) | CUTANEOUS | 0 refills | Status: DC | PRN
  Filled 2023-01-08: qty 454, 57d supply, fill #0

## 2023-01-08 MED ORDER — AMOXICILLIN-POT CLAVULANATE 875-125 MG PO TABS
1.0000 | ORAL_TABLET | Freq: Two times a day (BID) | ORAL | 0 refills | Status: AC
  Filled 2023-01-08: qty 2, 1d supply, fill #0

## 2023-01-08 MED ORDER — POLYETHYLENE GLYCOL 3350 17 GM/SCOOP PO POWD
17.0000 g | Freq: Every day | ORAL | 0 refills | Status: DC | PRN
  Filled 2023-01-08: qty 238, 14d supply, fill #0

## 2023-01-08 MED ORDER — GLUCERNA SHAKE PO LIQD
237.0000 mL | Freq: Two times a day (BID) | ORAL | 0 refills | Status: AC
  Filled 2023-01-08: qty 6921, 15d supply, fill #0

## 2023-01-08 MED ORDER — ADULT MULTIVITAMIN W/MINERALS CH
1.0000 | ORAL_TABLET | Freq: Every day | ORAL | 0 refills | Status: DC
  Filled 2023-01-08: qty 30, 30d supply, fill #0

## 2023-01-08 MED ORDER — VITAMIN D3 25 MCG PO TABS
1000.0000 [IU] | ORAL_TABLET | Freq: Every day | ORAL | 0 refills | Status: DC
  Filled 2023-01-08: qty 30, 30d supply, fill #0

## 2023-01-08 NOTE — Discharge Summary (Signed)
Physician Discharge Summary  [name] UJW:[id number] DOB: 19-May-1956 DOA: 12/22/2022  PCP: Jethro Bastos, MD  Admit date: 12/22/2022 Discharge date: 01/08/2023  Admitted From: Home Disposition: Home  Recommendations for Outpatient Follow-up:  Follow up with PCP in 1-2 weeks Continue follow-up with urology as scheduled Continue wound care as directed  Home Health: [facility] Equipment/Devices: No new equipment  Discharge Condition: Stable CODE STATUS: Full Diet recommendation: As tolerated  Brief/Interim Summary: This is a 67 year old male with adrenal insufficiency, history of DVT, history of lower GI bleed, paraplegic with no sensation below the abdomen, neurogenic bladder with suprapubic catheter, colostomy, multiple myeloma with metastasis who was sent from the cancer center due to fever, tachycardia, hypotension and a WBC count of 26.7 despite levofloxacin. He was initially admitted to the hospital for a UTI and was started on ceftriaxone.  WBC count was noted to improve slightly.    6/2-noted to have scrotal drainage. Urology consulted and noted that the patient had an abscess with necrotic borders. 6/3-underwent debridement of necrotic tissue and given a diagnosis of scrotal abscess with foreign years gangrene - Antibiotics transitioned to Zosyn and Zyvox 6/12 -lengthy discussion with wife, she continues education to provide bedside care for her husband.  First educational discussion was this morning, plan is to have additional teaching on the 13th and 14th. She is hopeful that by Friday the 14th she will be more comfortable and understand her role and patient's needs prior to discharge home.   Patient remains medically stable for discharge, wife continues to request ongoing education in regards to patient's wound care and medical needs - however she reportedly left the hospital in the middle of educational session on the 13th further delaying discharge.  Unclear if she is appropriate to care for patient's medical needs at home.  She is now requesting to have an additional educational session Monday, 01/08/2023.Multiple teaching events this past weekend. Patient has been medically stable for discharge now since 01/04/23.  Bedside teaching again today with urology, discussed with wife given changes and bandage changes per urology's recommendations and assistance from wife and acquaintances for wound care patient is otherwise stable for discharge home.  Discharge Diagnoses:  Principal Problem:   Fournier's gangrene of scrotum Active Problems:   Paraplegia (HCC)   Neurogenic bladder   Multiple myeloma in relapse (HCC)   Neurogenic bowel   Iron deficiency anemia   Hypoalbuminemia   History of DVT (deep vein thrombosis)   Type 2 diabetes mellitus with obesity (HCC)   OSA (obstructive sleep apnea)   Glaucoma   Scrotal wall abscess  Sepsis secondary to UTI and Fournier's gangrene of scrotum-in immunocompromised patient -Hypothermic, tachycardic with leukocytosis at intake with notable sources of infection including UTI and Fournier's gangrene  - Status post extensive scrotal debridement and now with exposed testicles - Given PIP tazobactam and Zyvox initially - Urine culture reveals Proteus mirabilis which is resistant to ciprofloxacin and nitrofurantoin - Blood culture negative -Discharge on remainder of antibiotic course - WBC somewhat labile but generally downtrending  - Transposition of testicles to thigh and partial wound closure completed on 6/7 and most of the wound is now closed-scrotal packing being done daily  -Continue wound care at home as directed per urology   Microcytic, iron deficiency anemia - Status post 1 unit PRBC 12/28/2022 -Hemoglobin stable around 8 - Microcytosis resolved- cont oral iron supplementation   Mild thrombocytosis - resolved   T8 Paraplegia (HCC) Neurogenic bladder Neurogenic  bowel -Chronic, bedbound at  baseline, patient has a suprapubic catheter and a colostomy; patient is managed quite well at home   Multiple myeloma, in relapse -Originally diagnosed in 2011 - Currently receiving Kyprolis every 2 weeks since June 2023 -Continue acyclovir   History of DVT (deep vein thrombosis) - Status post IVC filter and managed with Xarelto - Oncology recommends indefinite treatment with Xarelto due to the patient's bedbound status   Morbid obesity Body mass index is 36.81 kg/m.   Discharge Instructions  Discharge Instructions     Discharge wound care:   Complete by: As directed    Patient will need packing of scrotal wound with damp Kerlix once a day and as needed.  There are a few tracts that are deeper than he would expect.  Recommend gently probing the periphery of the wound with a sterile Q-tip.  Wound care will be performed at [facility] Monday through Friday, and wife's friend who is an RN will handle wound care on Saturday Sunday.      Allergies as of 01/08/2023       Reactions   Ferumoxytol Nausea And Vomiting, Other (See Comments)   The patient felt like he was going to pass out and had chills also        Medication List     STOP taking these medications    levofloxacin 750 MG tablet Commonly known as: LEVAQUIN       TAKE these medications    acetaminophen 325 MG tablet Commonly known as: TYLENOL Take 325-650 mg by mouth every 6 (six) hours as needed for headache or mild pain.   acetic acid 0.25 % irrigation Irrigate with 1 Application as directed See admin instructions. Instill 50 ml's, clamp for 10 minutes, then remove clamp and drain the bladder. Repeat 3 times a week- when not using the Renacidin irrigation   acyclovir 400 MG tablet Commonly known as: ZOVIRAX Take 1 tablet (400 mg total) by mouth 2 (two) times daily.   albuterol 108 (90 Base) MCG/ACT inhaler Commonly known as: VENTOLIN HFA Inhale 2 puffs into the lungs every 4 (four) hours as needed for  wheezing or shortness of breath.   amoxicillin-clavulanate 875-125 MG tablet Commonly known as: AUGMENTIN Take 1 tablet by mouth every 12 hours for 1 day.   atorvastatin 20 MG tablet Commonly known as: LIPITOR Take 20 mg by mouth daily. What changed: Another medication with the same name was changed. Make sure you understand how and when to take each.   atorvastatin 20 MG tablet Commonly known as: LIPITOR Take 1 tablet (20 mg) by mouth daily. What changed:  medication strength how much to take   BALMEX EX Apply 1 application  topically See admin instructions. Apply topically daily to affected sites   zinc oxide 11.3 % Crea cream Commonly known as: BALMEX Apply 1 Application topically 2 (two) times daily.   Benadryl Allergy 25 MG tablet Generic drug: diphenhydrAMINE Take 25 mg by mouth every 6 (six) hours as needed (for allergic reactions).   Biofreeze 4 % Gel Generic drug: Menthol (Topical Analgesic) Apply 1 application  topically every 4 (four) hours as needed (for joint pain- to intact areas of the skin only).   Chloraseptic Max Sore Throat 1.5-33 % Liqd Generic drug: Phenol-Glycerin Use as directed 1 spray in the mouth or throat every 2 (two) hours as needed (for a sore throat).   CORICIDIN HBP COLD/FLU PO Take 1 tablet by mouth every 6 (six) hours as needed (  for coughing or nasal congestion).   Cosopt PF 2-0.5 % Soln Generic drug: Dorzolamide HCl-Timolol Mal PF Place 1 drop into both eyes 2 (two) times daily.   doxycycline 100 MG tablet Commonly known as: VIBRA-TABS Take 1 tablet by mouth every 12 hours for 2 days.   EUCERIN EX Apply 1 application  topically See admin instructions. Apply to affected area 2 times a day   feeding supplement (GLUCERNA SHAKE) Liqd Take 237 mLs by mouth 2 (two) times daily between meals for 15 days.   nutrition supplement (JUVEN) Pack Take 1 packet by mouth 2 (two) times daily between meals.   Flovent HFA 110 MCG/ACT  inhaler Generic drug: fluticasone 2 puffs See admin instructions. 2 sprays to colostomy stoma prior to each colostomy bag change   guaiFENesin 100 MG/5ML liquid Commonly known as: ROBITUSSIN Take 10 mLs by mouth every 4 (four) hours as needed for cough or to loosen phlegm.   iron polysaccharides 150 MG capsule Commonly known as: Nu-Iron Take 1 capsule (150 mg total) by mouth daily.   latanoprost 0.005 % ophthalmic solution Commonly known as: XALATAN Place 1 drop into both eyes at bedtime.   Minerin Creme Crea Apply to affected areas 2 times daily as needed. What changed: reasons to take this   multivitamin with minerals Tabs tablet Take 1 tablet by mouth daily.   NON FORMULARY Apply 1 application  topically See admin instructions. Colo Plast paste- Apply as directed with each ostomy bag change   ondansetron 4 MG tablet Commonly known as: ZOFRAN Take 1 tablet (4 mg) by mouth every 6 hours as needed for nausea. What changed:  medication strength how much to take when to take this reasons to take this   polyethylene glycol powder 17 GM/SCOOP powder Commonly known as: GLYCOLAX/MIRALAX Take 17 g by mouth daily as needed for mild constipation.   PreviDent 5000 Plus 1.1 % Crea dental cream Generic drug: sodium fluoride Place 1 Application onto teeth See admin instructions. Brush onto teeth after evening mouth care- spit out excess and do not rinse   PREVIDENT 5000 SENSITIVE DT Place 1 application  onto teeth See admin instructions. Brush onto the teeth every day after evening mouth care- spit out excess and do not rinse   RENACIDIN IR Irrigate with 30 mLs as directed See admin instructions. Instill 30 ml's, clamp for 10 minutes, then remove clamp and drain the bladder. Repeat 3 times a week- when not using the acetic acid irrigation   rivaroxaban 10 MG Tabs tablet Commonly known as: XARELTO Take 1 tablet (10 mg total) by mouth daily with supper. What changed: when to take  this   Valium 5 MG tablet Generic drug: diazepam Take 5 mg by mouth 2 (two) times daily as needed for muscle spasms (after spinal cord injury).   vitamin D3 25 MCG tablet Commonly known as: CHOLECALCIFEROL Take 1 tablet by mouth daily. What changed:  medication strength how much to take when to take this               Discharge Care Instructions  (From admission, onward)           Start     Ordered   01/08/23 0000  Discharge wound care:       Comments: Patient will need packing of scrotal wound with damp Kerlix once a day and as needed.  There are a few tracts that are deeper than he would expect.  Recommend gently probing the periphery  of the wound with a sterile Q-tip.  Wound care will be performed at [facility] Monday through Friday, and wife's friend who is an RN will handle wound care on Saturday Sunday.   01/08/23 0957            Allergies  Allergen Reactions   Ferumoxytol Nausea And Vomiting and Other (See Comments)    The patient felt like he was going to pass out and had chills also     Consultations: Infectious disease, urology  Procedures/Studies: US SCROTUM W/DOPPLER  Result Date: 12/24/2022 CLINICAL DATA:  Pain and swelling EXAM: SCROTAL ULTRASOUND DOPPLER ULTRASOUND OF THE TESTICLES TECHNIQUE: Complete ultrasound examination of the testicles, epididymis, and other scrotal structures was performed. Color and spectral Doppler ultrasound were also utilized to evaluate blood flow to the testicles. COMPARISON:  None Available. FINDINGS: Right testicle Measurements: 3.1 x 2.1 x 2.4 cm. There is slightly in homogeneous echogenicity in the right testis without discrete focal lesions. There is 2 x 2.1 x 2.6 cm hyperechoic area adjacent to the inferior margin of the right testis. Left testicle Measurements: 2.6 x 1.4 x 2.2 cm. There is slightly inhomogeneous echogenicity in the left testis. There is 2.3 x 2.9 x 1.9 cm mixed echogenic area adjacent to the inferior  margin of the left testis. Right epididymis:  Normal in size and appearance. Left epididymis:  Normal in size and appearance. Hydrocele:  Small to moderate bilateral hydronephrosis. Varicocele:  None visualized. Pulsed Doppler interrogation of both testes demonstrates normal low resistance arterial and venous waveforms bilaterally. IMPRESSION: There is no evidence of testicular torsion. The slightly inhomogeneous echogenicity in both testes without discrete measurable focal lesions. There is 2 x 2.1 x 2.6 cm hyperechoic area adjacent to the inferior margin of the right testis. There is 2.3 x 2.9 x 1.9 cm mixed echogenic area inferior to the left testis. Significance of this finding is not clear. This may suggest chronic inflammatory process or unusual neoplastic process. Urology consultation and short-term follow-up scrotal sonogram may be considered. Moderate bilateral hydronephrosis. Electronically Signed   By: Ernie Avena M.D.   On: 12/24/2022 18:17   DG Chest 1 View  Result Date: 12/21/2022 CLINICAL DATA:  Persistent cough. EXAM: CHEST  1 VIEW COMPARISON:  Chest radiograph 08/21/2018 FINDINGS: Low lung volume exam. Stable cardiac and mediastinal contours with prominent hila bilaterally. Lungs are clear. No pleural effusion or pneumothorax. Thoracic spinal fusion hardware. Stable deformity left lateral rib. IMPRESSION: Low lung volume exam. No acute cardiopulmonary process. Electronically Signed   By: Annia Belt M.D.   On: 12/21/2022 06:27     Subjective: No acute issues or events overnight   Discharge Exam: Vitals:   01/07/23 2058 01/08/23 0515  BP: 131/86 111/77  Pulse: 97 98  Resp: 17 17  Temp: 98.1 F (36.7 C) 99 F (37.2 C)  SpO2: 97% 96%   Vitals:   01/07/23 0539 01/07/23 1331 01/07/23 2058 01/08/23 0515  BP: 121/75 121/76 131/86 111/77  Pulse: 92 75 97 98  Resp: 16 16 17 17   Temp: 99.3 F (37.4 C) 98.5 F (36.9 C) 98.1 F (36.7 C) 99 F (37.2 C)  TempSrc: Oral Oral  Oral Oral  SpO2: 98% 100% 97% 96%  Weight:      Height:        General exam: Resting comfortably  HEENT: oral mucosa moist Respiratory system: Clear to auscultation.  Cardiovascular system: S1 & S2 heard  Gastrointestinal system: Abdomen soft, non-tender, nondistended. Normal bowel sounds;  ostomy clean dry intact GU: bandages clean/dry/in place Extremities: No cyanosis, clubbing or edema Psychiatry:  Mood & affect appropriate.     The results of significant diagnostics from this hospitalization (including imaging, microbiology, ancillary and laboratory) are listed below for reference.     Microbiology: No results found for this or any previous visit (from the past 240 hour(s)).   Labs: BNP (last 3 results) No results for input(s): "BNP" in the last 8760 hours. Basic Metabolic Panel: No results for input(s): "NA", "K", "CL", "CO2", "GLUCOSE", "BUN", "CREATININE", "CALCIUM", "MG", "PHOS" in the last 168 hours. Liver Function Tests: No results for input(s): "AST", "ALT", "ALKPHOS", "BILITOT", "PROT", "ALBUMIN" in the last 168 hours. No results for input(s): "LIPASE", "AMYLASE" in the last 168 hours. No results for input(s): "AMMONIA" in the last 168 hours. CBC: Recent Labs  Lab 01/04/23 0558 01/05/23 0602 01/06/23 0651 01/07/23 0618 01/08/23 0456  WBC 17.4* 14.8* 13.4* 13.2* 14.5*  NEUTROABS 14.8* 12.3* 10.8* 10.6* 11.6*  HGB 8.8* 8.5* 8.4* 8.7* 8.7*  HCT 26.7* 25.9* 26.2* 26.5* 26.9*  MCV 84.5 83.5 83.2 83.6 83.5  PLT 378 340 361 354 359   Cardiac Enzymes: No results for input(s): "CKTOTAL", "CKMB", "CKMBINDEX", "TROPONINI" in the last 168 hours. BNP: Invalid input(s): "POCBNP" CBG: No results for input(s): "GLUCAP" in the last 168 hours. D-Dimer No results for input(s): "DDIMER" in the last 72 hours. Hgb A1c No results for input(s): "HGBA1C" in the last 72 hours. Lipid Profile No results for input(s): "CHOL", "HDL", "LDLCALC", "TRIG", "CHOLHDL", "LDLDIRECT" in  the last 72 hours. Thyroid function studies No results for input(s): "TSH", "T4TOTAL", "T3FREE", "THYROIDAB" in the last 72 hours.  Invalid input(s): "FREET3" Anemia work up No results for input(s): "VITAMINB12", "FOLATE", "FERRITIN", "TIBC", "IRON", "RETICCTPCT" in the last 72 hours. Urinalysis    Component Value Date/Time   COLORURINE YELLOW 12/22/2022 1440   APPEARANCEUR TURBID (A) 12/22/2022 1440   LABSPEC 1.018 12/22/2022 1440   PHURINE 6.0 12/22/2022 1440   GLUCOSEU NEGATIVE 12/22/2022 1440   HGBUR MODERATE (A) 12/22/2022 1440   HGBUR small 07/08/2010 0831   BILIRUBINUR NEGATIVE 12/22/2022 1440   KETONESUR NEGATIVE 12/22/2022 1440   PROTEINUR 100 (A) 12/22/2022 1440   UROBILINOGEN 0.2 07/21/2014 1123   NITRITE NEGATIVE 12/22/2022 1440   LEUKOCYTESUR MODERATE (A) 12/22/2022 1440   Sepsis Labs Recent Labs  Lab 01/05/23 0602 01/06/23 0651 01/07/23 0618 01/08/23 0456  WBC 14.8* 13.4* 13.2* 14.5*   Microbiology No results found for this or any previous visit (from the past 240 hour(s)).   Time coordinating discharge: Over 30 minutes  SIGNED:   Azucena Fallen, DO [facility] 01/08/2023, 1:24 PM Pager   If 7PM-7AM, please contact night-coverage [email or web address]

## 2023-01-08 NOTE — Progress Notes (Signed)
10 Days Post-Op   Subjective/Chief Complaint:  1 - Neurogenic Bladder - Pt wtih T8 hemiplegia due to invasive multiple myeloma with subsequenst areflexic neurogenic bladder. SPT placed 03/2013 (35F silicone) and changed Q2 Weeks by [facility]. Also irrigates PRN to reduce sediment.   Recent Summarized Surveillance:  04/2022 - Cr 0.74 , Renal US w/o stones or hydro   2 - Fournier's Gangrene - s/p OR debridement 12/25/22 for very early necrotizing infectgion of scrotal area.  Second look, thigh pouch creation, and partial closure on 12/29/2022.  Objective: Vital signs in last 24 hours: Temp:  [98.1 F (36.7 C)-99 F (37.2 C)] 99 F (37.2 C) (06/17 0515) Pulse Rate:  [75-98] 98 (06/17 0515) Resp:  [16-17] 17 (06/17 0515) BP: (111-131)/(76-86) 111/77 (06/17 0515) SpO2:  [96 %-100 %] 96 % (06/17 0515) Last BM Date : 01/08/23  Intake/Output from previous day: 06/16 0701 - 06/17 0700 In: 240 [P.O.:240] Out: 1200 [Urine:1200] Intake/Output this shift: No intake/output data recorded.  EXAM: NAD, In good spirits as always, very pleasant. Pulm: Non-labored breathign on minimal [place] O2 Stable truncal obesity GU: Scrotal defect is largely closed with loose suturing.  2 small openings at bottom of scrotum.  No erythema or purulent drainage. SPT with non-foul urine.  MSK: Stable LE contracures  c/w paraplegia  Lab Results:  Recent Labs    01/07/23 0618 01/08/23 0456  WBC 13.2* 14.5*  HGB 8.7* 8.7*  HCT 26.5* 26.9*  PLT 354 359   BMET No results for input(s): "NA", "K", "CL", "CO2", "GLUCOSE", "BUN", "CREATININE", "CALCIUM" in the last 72 hours.  PT/INR No results for input(s): "LABPROT", "INR" in the last 72 hours. ABG No results for input(s): "PHART", "HCO3" in the last 72 hours.  Invalid input(s): "PCO2", "PO2"  Studies/Results: No results found.  Anti-infectives: Anti-infectives (From admission, onward)    Start     Dose/Rate Route Frequency Ordered Stop    12/28/22 1800  doxycycline (VIBRA-TABS) tablet 100 mg        100 mg Oral Every 12 hours 12/28/22 1456 01/08/23 2359   12/28/22 1545  amoxicillin-clavulanate (AUGMENTIN) 875-125 MG per tablet 1 tablet        1 tablet Oral Every 12 hours 12/28/22 1456 01/08/23 2359   12/25/22 1730  linezolid (ZYVOX) IVPB 600 mg  Status:  Discontinued        600 mg 300 mL/hr over 60 Minutes Intravenous 2 times daily 12/25/22 1630 12/28/22 1456   12/25/22 1730  piperacillin-tazobactam (ZOSYN) IVPB 3.375 g  Status:  Discontinued        3.375 g 12.5 mL/hr over 240 Minutes Intravenous Every 8 hours 12/25/22 1630 12/28/22 1456   12/25/22 0300  vancomycin (VANCOCIN) IVPB 1000 mg/200 mL premix  Status:  Discontinued        1,000 mg 200 mL/hr over 60 Minutes Intravenous Every 12 hours 12/24/22 1344 12/25/22 1630   12/24/22 1430  vancomycin (VANCOREADY) IVPB 2000 mg/400 mL        2,000 mg 200 mL/hr over 120 Minutes Intravenous  Once 12/24/22 1342 12/24/22 1624   12/23/22 0000  ceFEPIme (MAXIPIME) 2 g in sodium chloride 0.9 % 100 mL IVPB  Status:  Discontinued        2 g 200 mL/hr over 30 Minutes Intravenous Every 8 hours 12/22/22 1501 12/22/22 1559   12/22/22 2200  acyclovir (ZOVIRAX) tablet 400 mg        400 mg Oral 2 times daily 12/22/22 1632  12/22/22 1800  cefTRIAXone (ROCEPHIN) 2 g in sodium chloride 0.9 % 100 mL IVPB  Status:  Discontinued        2 g 200 mL/hr over 30 Minutes Intravenous Every 24 hours 12/22/22 1559 12/25/22 1630   12/22/22 1530  ceFEPIme (MAXIPIME) 2 g in sodium chloride 0.9 % 100 mL IVPB  Status:  Discontinued        2 g 200 mL/hr over 30 Minutes Intravenous  Once 12/22/22 1436 12/22/22 1559       Assessment/Plan:  S/p significant scrotal debridement of necrotic tissue with Dr. Lafonda Mosses on 12/25/2022.  Taken back for second look, creation of thigh pouches, and partial closure by Dr. Berneice Heinrich on 12/29/2022.  Sinus had drained at time of surgery and there was no fluid to collect in the  OR.  ID following.  Transition from Zosyn to Doxy/Augmentin.  Treatment to continue through 6/16  Of note, when probing it feels as though the bottom of the wound is palpable at around the 7 o'clock position.  Rotating to around the 8 o'clock position falls into another tract that is around 6 to 7 cm deep.    Wound care every 24 hours.  Wet-to-dry dressing  Would benefit from home health assessing his wound a couple times a week  Did wound care with wife again today.  Significant improvement in the depth of wound bed over the weekend.  He will follow-up with [place] closely in clinic.  Expecting around 1 week.  Okay to discharge from a urologic perspective    Scherrie Bateman Shaneque Merkle 01/08/2023

## 2023-01-08 NOTE — TOC Transition Note (Addendum)
Transition of Care [facility]) - CM/SW Discharge Note   Patient Details  Name: [name] MRN: [id number] Date of Birth: 06/13/1956  [facility]) CM/SW Contact:  Beckie Busing, RN Phone Number:[phone]  01/08/2023, 12:13 PM   Clinical Narrative:    Patient to discharge home with [facility] following for [facility] care needs. Per Marylene Land at [facility] will be established with a [facility] contracted agency. [facility] will set everything up. CM will need to fax d/c summary to [facility] @ [phone].Bedside nurse and MD updated. CM will notify [facility] once d/c summary has been received. Wife [name] has been made aware of discharge. No other needs noted at this time.   1344 Discharge summary has been faxed to [facility] clinic per previous request. CM attempted to contact Marylene Land to make her aware that transportation can be arranged. Message has been left , will await confirmation.   1408 Cm received call from Marylene Land with [facility] to confirm that patient is all set for transport. Transportation has been arranged per [employer]. No other TOC needs noted at this time. TOC will sign off.    Final next level of care: Home w Home Health Services (Provided by [facility]) Barriers to Discharge: No Barriers Identified   Patient Goals and CMS Choice CMS Medicare.gov Compare Post Acute Care list provided to::  (n/a) Choice offered to / list presented to : NA  Discharge Placement                  Patient to be transferred to facility by: [facility] Name of family member notified: Lorine Bears spouse made aware of discharge    Discharge Plan and Services Additional resources added to the After Visit Summary for   In-house Referral: NA Discharge Planning Services: CM Consult Post Acute Care Choice: NA          DME Arranged: N/A DME Agency: NA       HH Arranged: [facility]/Personal Care Services (all services contracted through [facility]) HH  Agency: NA ([facility] will provide through contracted agency)        Social Determinants of Health (SDOH) Interventions SDOH Screenings   Food Insecurity: No Food Insecurity (12/22/2022)  Housing: Low Risk  (12/22/2022)  Transportation Needs: No Transportation Needs (12/22/2022)  Utilities: Not At Risk (12/22/2022)  Tobacco Use: Low Risk  (12/30/2022)     Readmission Risk Interventions    12/26/2022    3:45 PM 12/26/2022    3:25 PM  Readmission Risk Prevention Plan  Post Dischage Appt Complete Complete  Medication Screening Complete Complete  Transportation Screening Complete Complete

## 2023-01-09 ENCOUNTER — Other Ambulatory Visit (HOSPITAL_COMMUNITY): Payer: Self-pay

## 2023-01-10 ENCOUNTER — Telehealth: Payer: Self-pay

## 2023-01-10 ENCOUNTER — Inpatient Hospital Stay: Payer: Self-pay | Admitting: Family

## 2023-01-10 NOTE — Telephone Encounter (Signed)
Called patient regarding appointment today. Spoke with patient's wife who was not aware of visit. Requesting to reschedule for a later date. Is scheduled for 7/3 at 10 am.  Would like to inform provider that patient last dose of antibiotic is tonight. Is not having any issues tolerating antibiotics. Feels like wound is doing well. HH has been helping with dressing change.  Provided office address and phone number to spouse. Will call office back with any concerns. Juanita Laster, RMA

## 2023-01-12 ENCOUNTER — Other Ambulatory Visit: Payer: Medicare (Managed Care)

## 2023-01-12 ENCOUNTER — Ambulatory Visit: Payer: Medicare (Managed Care)

## 2023-01-12 ENCOUNTER — Ambulatory Visit: Payer: Medicare (Managed Care) | Admitting: Hematology and Oncology

## 2023-01-17 ENCOUNTER — Other Ambulatory Visit (HOSPITAL_COMMUNITY): Payer: Self-pay

## 2023-01-19 ENCOUNTER — Other Ambulatory Visit: Payer: Medicare (Managed Care)

## 2023-01-19 ENCOUNTER — Ambulatory Visit: Payer: Medicare (Managed Care)

## 2023-01-24 ENCOUNTER — Ambulatory Visit (INDEPENDENT_AMBULATORY_CARE_PROVIDER_SITE_OTHER): Payer: Medicare (Managed Care) | Admitting: Family

## 2023-01-24 ENCOUNTER — Other Ambulatory Visit: Payer: Self-pay

## 2023-01-24 ENCOUNTER — Encounter: Payer: Self-pay | Admitting: Family

## 2023-01-24 VITALS — BP 112/75 | HR 100 | Temp 97.2°F

## 2023-01-24 DIAGNOSIS — N493 Fournier gangrene: Secondary | ICD-10-CM

## 2023-01-24 NOTE — Patient Instructions (Signed)
Nice to see you.  Continue to follow up with Dr. Berneice Heinrich for wound care.  No additional antibiotics needed.  Protein, protein, protein.  Monitor for signs of infection.  Have a great day and stay safe!

## 2023-01-24 NOTE — Progress Notes (Signed)
Subjective:    Patient ID: [name], male    DOB: August 05, 1955, 67 y.o.   MRN: [id number]  Chief Complaint  Patient presents with   Follow-up    Fournier's gangrene    HPI:  [name] is a 67 y.o. male with necrotizing fascitis of the perineum last seen by Dr. Elinor Parkinson on 01/01/23 in the hospital presenting today for hospitalization follow up.  [name] was recently hospitalized with Fournier's gangrene s/p debridement and treatment with doxycycline and amoxicillin/clavulanate which he completed as prescribed on 6/16 with no adverse side effects. Seen by Urology yesterday with continued wound healing and no further drainage or other evidence of infection. Complicated by insensate in the area. Has been taking Glucerna and working on protein intake. Denies fever or chills.    Allergies  Allergen Reactions   Ferumoxytol Nausea And Vomiting and Other (See Comments)    The patient felt like he was going to pass out and had chills also       Outpatient Medications Prior to Visit  Medication Sig Dispense Refill   acetaminophen (TYLENOL) 325 MG tablet Take 325-650 mg by mouth every 6 (six) hours as needed for headache or mild pain.     acetic acid 0.25 % irrigation Irrigate with 1 Application as directed See admin instructions. Instill 50 ml's, clamp for 10 minutes, then remove clamp and drain the bladder. Repeat 3 times a week- when not using the Renacidin irrigation     acyclovir (ZOVIRAX) 400 MG tablet Take 1 tablet (400 mg total) by mouth 2 (two) times daily. 60 tablet 2   albuterol (VENTOLIN HFA) 108 (90 Base) MCG/ACT inhaler Inhale 2 puffs into the lungs every 4 (four) hours as needed for wheezing or shortness of breath.     atorvastatin (LIPITOR) 20 MG tablet Take 20 mg by mouth daily.     BENADRYL ALLERGY 25 MG tablet Take 25 mg by mouth every 6 (six) hours as needed (for allergic reactions).     BIOFREEZE 4 % GEL Apply 1 application  topically every 4 (four)  hours as needed (for joint pain- to intact areas of the skin only).     CHLORASEPTIC MAX SORE THROAT 1.5-33 % LIQD Use as directed 1 spray in the mouth or throat every 2 (two) hours as needed (for a sore throat).     Chlorpheniramine-Acetaminophen (CORICIDIN HBP COLD/FLU PO) Take 1 tablet by mouth every 6 (six) hours as needed (for coughing or nasal congestion).     Citric Ac-Gluconolact-Mg Carb (RENACIDIN IR) Irrigate with 30 mLs as directed See admin instructions. Instill 30 ml's, clamp for 10 minutes, then remove clamp and drain the bladder. Repeat 3 times a week- when not using the acetic acid irrigation     COSOPT PF 2-0.5 % SOLN Place 1 drop into both eyes 2 (two) times daily.     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     Emollient (EUCERIN EX) Apply 1 application  topically See admin instructions. Apply to affected area 2 times a day     FLOVENT HFA 110 MCG/ACT inhaler 2 puffs See admin instructions. 2 sprays to colostomy stoma prior to each colostomy bag change     guaiFENesin (ROBITUSSIN) 100 MG/5ML liquid Take 10 mLs by mouth every 4 (four) hours as needed for cough or to loosen phlegm.     iron  polysaccharides (NU-IRON) 150 MG capsule Take 1 capsule (150 mg total) by mouth daily. 30 capsule 0   latanoprost (XALATAN) 0.005 % ophthalmic solution Place 1 drop into both eyes at bedtime.     Multiple Vitamin (MULTIVITAMIN WITH MINERALS) TABS tablet Take 1 tablet by mouth daily. 30 tablet 0   NON FORMULARY Apply 1 application  topically See admin instructions. Colo Plast paste- Apply as directed with each ostomy bag change     nutrition supplement, JUVEN, (JUVEN) PACK Take 1 packet by mouth 2 (two) times daily between meals. 60 each 0   ondansetron (ZOFRAN) 4 MG tablet Take 1 tablet (4 mg) by mouth every 6 hours as needed for nausea. 20 tablet 0   rivaroxaban (XARELTO) 10 MG TABS tablet Take 1 tablet (10 mg  total) by mouth daily with supper. (Patient taking differently: Take 10 mg by mouth daily.) 30 tablet 9   Skin Protectants, Misc. (MINERIN CREME) CREA Apply to affected areas 2 times daily as needed. 454 g 0   atorvastatin (LIPITOR) 20 MG tablet Take 1 tablet (20 mg) by mouth daily. (Patient not taking: Reported on 01/24/2023) 30 tablet 0   vitamin D3 (CHOLECALCIFEROL) 25 MCG tablet Take 1 tablet by mouth daily. (Patient not taking: Reported on 01/24/2023) 30 tablet 0   polyethylene glycol powder (GLYCOLAX/MIRALAX) 17 GM/SCOOP powder Take 17 g by mouth daily as needed for mild constipation. (Patient not taking: Reported on 01/24/2023) 238 g 0   PREVIDENT 5000 PLUS 1.1 % CREA dental cream Place 1 Application onto teeth See admin instructions. Brush onto teeth after evening mouth care- spit out excess and do not rinse (Patient not taking: Reported on 01/24/2023)     Sod Fluoride-Potassium Nitrate (PREVIDENT 5000 SENSITIVE DT) Place 1 application  onto teeth See admin instructions. Brush onto the teeth every day after evening mouth care- spit out excess and do not rinse (Patient not taking: Reported on 01/24/2023)     Zinc Oxide (BALMEX EX) Apply 1 application  topically See admin instructions. Apply topically daily to affected sites (Patient not taking: Reported on 01/24/2023)     zinc oxide (BALMEX) 11.3 % CREA cream Apply 1 Application topically 2 (two) times daily. (Patient not taking: Reported on 01/24/2023) 85 g 0   Facility-Administered Medications Prior to Visit  Medication Dose Route Frequency Provider Last Rate Last Admin   sodium chloride flush (NS) 0.9 % injection 10 mL  10 mL Intravenous PRN Artis Delay, MD         Past Medical History:  Diagnosis Date   Adrenal insufficiency (HCC)    on chronic dexamethasone   Anemia    Cancer (HCC)    Coagulopathy (HCC)    on xeralto/ s/p DVT while on coumadin,  IVC in place   Diabetes mellitus without complication (HCC)    type 2   Glaucoma 12/22/2022    Gross hematuria 01/2013   post foley cath procedure   History of blood transfusion 01/2013   Lower GI bleeding 04/18/2017   Multiple myeloma    thoracic T8 with paraplegia s/p resection- on chemo at visit 10/13/10   Multiple myeloma    Multiple myeloma without mention of remission    Neurogenic bladder    Neurogenic bowel    OSA (obstructive sleep apnea) 11/01/2022   Paraplegia (HCC)    Partial small bowel obstruction (HCC) during dec 2011 admission     Past Surgical History:  Procedure Laterality Date   COLONOSCOPY WITH PROPOFOL N/A 04/12/2017  Procedure: COLONOSCOPY WITH PROPOFOL;  Surgeon: Hilarie Fredrickson, MD;  Location: [facility];  Service: Endoscopy;  Laterality: N/A;   COLONOSCOPY WITH PROPOFOL N/A 04/19/2017   Procedure: COLONOSCOPY WITH PROPOFOL;  Surgeon: Benancio Deeds, MD;  Location: [facility];  Service: Gastroenterology;  Laterality: N/A;   COLOSTOMY  07/20/2011   Procedure: COLOSTOMY;  Surgeon: Rulon Abide, DO;  Location[facility] OR;  Service: General;;   COLOSTOMY REVISION  07/20/2011   Procedure: COLON RESECTION SIGMOID;  Surgeon: Rulon Abide, DO;  Location: [facility] OR;  Service: General;;   CYSTOSCOPY N/A 04/04/2013   Procedure: CYSTOSCOPY WITH LITHALOPAXY;  Surgeon: Sebastian Ache, MD;  Location: [facility];  Service: Urology;  Laterality: N/A;   INCISION AND DRAINAGE ABSCESS N/A 12/25/2022   Procedure: INCISION AND DRAINAGE OF SCROTUM;  Surgeon: Despina Arias, MD;  Location: [facility];  Service: Urology;  Laterality: N/A;   INSERTION OF SUPRAPUBIC CATHETER N/A 04/04/2013   Procedure: INSERTION OF SUPRAPUBIC CATHETER;  Surgeon: Sebastian Ache, MD;  Location: [facility];  Service: Urology;  Laterality: N/A;   LAPAROTOMY  07/20/2011   Procedure: EXPLORATORY LAPAROTOMY;  Surgeon: Rulon Abide, DO;  Location: [facility] OR;  Service: General;  Laterality: N/A;   myeloma thoracic T8 with parpaplegia s/p thoracotomy and thoracic T7-9 cage placement on Dec 26th 2011   07/18/10   SCROTAL EXPLORATION N/A 12/29/2022   Procedure: SCROTAL WOUND DEBRIDEMENT AND CLOSURE;  Surgeon: Loletta Parish., MD;  Location: [facility];  Service: Urology;  Laterality: N/A;       Review of Systems  Constitutional:  Negative for chills, diaphoresis, fatigue and fever.  Respiratory:  Negative for cough, chest tightness, shortness of breath and wheezing.   Cardiovascular:  Negative for chest pain.  Gastrointestinal:  Negative for abdominal pain, diarrhea, nausea and vomiting.      Objective:    BP 112/75   Pulse 100   Temp (!) 97.2 F (36.2 C) (Temporal)   SpO2 97%  Nursing note and vital signs reviewed.  Physical Exam Constitutional:      General: He is not in acute distress.    Appearance: He is well-developed.     Comments: Seated in wheelchair; pleasant.   Cardiovascular:     Rate and Rhythm: Normal rate and regular rhythm.     Heart sounds: Normal heart sounds.  Pulmonary:     Effort: Pulmonary effort is normal.     Breath sounds: Normal breath sounds.  Skin:    General: Skin is warm and dry.  Neurological:     Mental Status: He is alert and oriented to person, place, and time.  Psychiatric:        Behavior: Behavior normal.        Thought Content: Thought content normal.        Judgment: Judgment normal.          No data to display             Assessment & Plan:    Patient Active Problem List   Diagnosis Date Noted   Scrotal wall abscess 12/29/2022   Fournier's gangrene of scrotum 12/27/2022   Glaucoma 12/22/2022   OSA (obstructive sleep apnea) 11/01/2022   Snoring 09/21/2022   Goals of care, counseling/discussion 10/09/2019   On rivaroxaban therapy    Status post colonoscopy with polypectomy    Lower GI bleeding 04/18/2017   Acute blood loss anemia 04/18/2017   Type 2 diabetes mellitus with obesity (HCC) 04/18/2017  Colon cancer screening    Benign neoplasm of ascending colon    History of DVT (deep vein thrombosis)  06/10/2015   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   Bilateral hydronephrosis 07/21/2014   Acute kidney failure (HCC) 07/20/2014   Abdominal pain 07/20/2014   AKI (acute kidney injury) (HCC) 07/20/2014   Hematuria 02/12/2013   Shock (HCC) 02/12/2013   Adrenal insufficiency (HCC) 02/12/2013   Hypoalbuminemia 11/15/2012   Anemia, unspecified 09/18/2012   Unspecified vitamin D deficiency 09/18/2012   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   Iron deficiency anemia 09/18/2012   Diverticulitis of large intestine with perforation 07/26/2011   Multiple myeloma in relapse (HCC) 10/14/2010   Neurogenic bowel 10/14/2010   Paraplegia (HCC) 07/13/2010   Neurogenic bladder 07/11/2010   BACK PAIN, LUMBAR, WITH RADICULOPATHY 07/07/2010   DISEQUILIBRIUM 07/07/2010   Abdominal pain, generalized 07/07/2010   OBESITY, NOS 09/20/2006     Problem List Items Addressed This Visit       Genitourinary   Fournier's gangrene of scrotum - Primary    [name] has completed course of antibiotics and is s/p debridement with continued wound care and appropriate healing with no further evidence of infection. Discussed importance of protein intake to help wound heal. Continue wound care per Urology. No additional antibiotics needed at this time. Follow up with ID as needed.         I am having [name] maintain his latanoprost, iron polysaccharides, rivaroxaban, acyclovir, acetaminophen, atorvastatin, NON FORMULARY, PreviDent 5000 Plus, Citric Ac-Gluconolact-Mg Carb (RENACIDIN IR), acetic acid, Sod Fluoride-Potassium Nitrate (PREVIDENT 5000 SENSITIVE DT), Chloraseptic Max Sore Throat, albuterol, Zinc Oxide (BALMEX EX), Benadryl Allergy, Biofreeze, Chlorpheniramine-Acetaminophen (CORICIDIN HBP COLD/FLU PO), diazepam, guaiFENesin, Flovent HFA, Emollient (EUCERIN EX), Cosopt PF, atorvastatin, Minerin Creme, zinc oxide, nutrition supplement (JUVEN), multivitamin  with minerals, vitamin D3, ondansetron, and polyethylene glycol powder.   Follow-up: Follow up with ID as needed.    Marcos Eke, MSN, FNP-C Nurse Practitioner [facility] Main number: [phone]

## 2023-01-24 NOTE — Assessment & Plan Note (Signed)
Mr. Leer has completed course of antibiotics and is s/p debridement with continued wound care and appropriate healing with no further evidence of infection. Discussed importance of protein intake to help wound heal. Continue wound care per Urology. No additional antibiotics needed at this time. Follow up with ID as needed.

## 2023-01-26 ENCOUNTER — Inpatient Hospital Stay: Payer: Medicare (Managed Care) | Attending: Physician Assistant

## 2023-01-26 ENCOUNTER — Inpatient Hospital Stay (HOSPITAL_BASED_OUTPATIENT_CLINIC_OR_DEPARTMENT_OTHER): Payer: Medicare (Managed Care) | Admitting: Physician Assistant

## 2023-01-26 ENCOUNTER — Other Ambulatory Visit: Payer: Self-pay

## 2023-01-26 VITALS — BP 129/75 | HR 95 | Temp 98.3°F | Resp 17

## 2023-01-26 DIAGNOSIS — C9002 Multiple myeloma in relapse: Secondary | ICD-10-CM

## 2023-01-26 DIAGNOSIS — Z5111 Encounter for antineoplastic chemotherapy: Secondary | ICD-10-CM | POA: Diagnosis present

## 2023-01-26 DIAGNOSIS — Z86718 Personal history of other venous thrombosis and embolism: Secondary | ICD-10-CM | POA: Insufficient documentation

## 2023-01-26 DIAGNOSIS — Z7901 Long term (current) use of anticoagulants: Secondary | ICD-10-CM | POA: Diagnosis not present

## 2023-01-26 LAB — CBC WITH DIFFERENTIAL (CANCER CENTER ONLY)
Abs Immature Granulocytes: 0.03 K/uL (ref 0.00–0.07)
Basophils Absolute: 0 K/uL (ref 0.0–0.1)
Basophils Relative: 0 %
Eosinophils Absolute: 0.3 K/uL (ref 0.0–0.5)
Eosinophils Relative: 3 %
HCT: 30.9 % — ABNORMAL LOW (ref 39.0–52.0)
Hemoglobin: 10.2 g/dL — ABNORMAL LOW (ref 13.0–17.0)
Immature Granulocytes: 0 %
Lymphocytes Relative: 11 %
Lymphs Abs: 1 K/uL (ref 0.7–4.0)
MCH: 26 pg (ref 26.0–34.0)
MCHC: 33 g/dL (ref 30.0–36.0)
MCV: 78.6 fL — ABNORMAL LOW (ref 80.0–100.0)
Monocytes Absolute: 0.6 K/uL (ref 0.1–1.0)
Monocytes Relative: 7 %
Neutro Abs: 6.8 K/uL (ref 1.7–7.7)
Neutrophils Relative %: 79 %
Platelet Count: 398 K/uL (ref 150–400)
RBC: 3.93 MIL/uL — ABNORMAL LOW (ref 4.22–5.81)
RDW: 19.8 % — ABNORMAL HIGH (ref 11.5–15.5)
WBC Count: 8.8 K/uL (ref 4.0–10.5)
nRBC: 0 % (ref 0.0–0.2)

## 2023-01-26 LAB — CMP (CANCER CENTER ONLY)
ALT: 13 U/L (ref 0–44)
AST: 14 U/L — ABNORMAL LOW (ref 15–41)
Albumin: 3.2 g/dL — ABNORMAL LOW (ref 3.5–5.0)
Alkaline Phosphatase: 83 U/L (ref 38–126)
Anion gap: 7 (ref 5–15)
BUN: 8 mg/dL (ref 8–23)
CO2: 24 mmol/L (ref 22–32)
Calcium: 8.8 mg/dL — ABNORMAL LOW (ref 8.9–10.3)
Chloride: 111 mmol/L (ref 98–111)
Creatinine: 0.62 mg/dL (ref 0.61–1.24)
GFR, Estimated: >60 mL/min
Glucose, Bld: 133 mg/dL — ABNORMAL HIGH (ref 70–99)
Potassium: 3.3 mmol/L — ABNORMAL LOW (ref 3.5–5.1)
Sodium: 142 mmol/L (ref 135–145)
Total Bilirubin: 0.3 mg/dL (ref 0.3–1.2)
Total Protein: 6.5 g/dL (ref 6.5–8.1)

## 2023-01-26 MED ORDER — SODIUM CHLORIDE 0.9 % IV SOLN: Freq: Once | INTRAVENOUS | Status: AC

## 2023-01-26 MED ORDER — DEXTROSE 5 % IV SOLN
70.0000 mg/m2 | Freq: Once | INTRAVENOUS | Status: AC
  Administered 2023-01-26: 150 mg via INTRAVENOUS
  Filled 2023-01-26: qty 60

## 2023-01-26 MED ORDER — SODIUM CHLORIDE 0.9 % IV SOLN
40.0000 mg | Freq: Once | INTRAVENOUS | Status: AC
  Administered 2023-01-26: 40 mg via INTRAVENOUS
  Filled 2023-01-26: qty 4

## 2023-01-26 NOTE — Patient Instructions (Signed)
[facility]  Discharge Instructions: Thank you for choosing [facility] to provide your oncology and hematology care.   If you have a lab appointment with the Cancer Center, please go directly to the Cancer Center and check in at the registration area.   Wear comfortable clothing and clothing appropriate for easy access to any Portacath or PICC line.   We strive to give you quality time with your provider. You may need to reschedule your appointment if you arrive late (15 or more minutes).  Arriving late affects you and other patients whose appointments are after yours.  Also, if you miss three or more appointments without notifying the office, you may be dismissed from the clinic at the provider's discretion.      For prescription refill requests, have your pharmacy contact our office and allow 72 hours for refills to be completed.    Today you received the following chemotherapy and/or immunotherapy agents :  Kyprolis   To help prevent nausea and vomiting after your treatment, we encourage you to take your nausea medication as directed.  BELOW ARE SYMPTOMS THAT SHOULD BE REPORTED IMMEDIATELY: *FEVER GREATER THAN 100.4 F (38 C) OR HIGHER *CHILLS OR SWEATING *NAUSEA AND VOMITING THAT IS NOT CONTROLLED WITH YOUR NAUSEA MEDICATION *UNUSUAL SHORTNESS OF BREATH *UNUSUAL BRUISING OR BLEEDING *URINARY PROBLEMS (pain or burning when urinating, or frequent urination) *BOWEL PROBLEMS (unusual diarrhea, constipation, pain near the anus) TENDERNESS IN MOUTH AND THROAT WITH OR WITHOUT PRESENCE OF ULCERS (sore throat, sores in mouth, or a toothache) UNUSUAL RASH, SWELLING OR PAIN  UNUSUAL VAGINAL DISCHARGE OR ITCHING   Items with * indicate a potential emergency and should be followed up as soon as possible or go to the Emergency Department if any problems should occur.  Please show the CHEMOTHERAPY ALERT CARD or IMMUNOTHERAPY ALERT CARD at  check-in to the Emergency Department and triage nurse.  Should you have questions after your visit or need to cancel or reschedule your appointment, please contact [facility]  Dept: [phone]  and follow the prompts.  Office hours are 8:00 a.m. to 4:30 p.m. Monday - Friday. Please note that voicemails left after 4:00 p.m. may not be returned until the following business day.  We are closed weekends and major holidays. You have access to a nurse at all times for urgent questions. Please call the main number to the clinic Dept: [phone] and follow the prompts.   For any non-urgent questions, you may also contact your provider using MyChart. We now offer e-Visits for anyone 18 and older to request care online for non-urgent symptoms. For details visit [email or web address].   Also download the MyChart app! Go to the app store, search "MyChart", open the app, select [facility], and log in with your MyChart username and password.   

## 2023-01-26 NOTE — Progress Notes (Signed)
[facility] [phone]   [phone]  PROGRESS NOTE  Patient Care Team: Jethro Bastos, MD as PCP - General (Family Medicine) Ranelle Oyster, MD as Consulting Physician (Physical Medicine and Rehabilitation) Hildred Alamin Mcarthur Rossetti, PA-C as Physician Assistant (Physical Medicine and Rehabilitation) Jaci Standard, MD as Consulting Physician (Hematology and Oncology) Jaci Standard, MD as Consulting Physician (Hematology and Oncology) Jaci Standard, MD as Consulting Physician (Hematology and Oncology)  Hematological/Oncological History # IgG Lambda Multiple Myeloma, Relapsed (ISS Stage II) 1) 06/2010: initial diagnosis of Multiple Myeloma after T8 compression fracture. Treated with Velcade/Revlimid/Dexamethasone and achieved a complete remission 2) Velcade was discontinued in September 2012 and that Revlimid and Decadron were discontinued in March 2013. 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 4) Followed by Dr. Bertis Ruddy, last clinic visit 10/09/2019. At that time there was concern for relapse of his multiple myeloma.  5) Patient requested transfer to different provider after misunderstanding regarding imaging studies 6) 12/17/2019: transfer care to Dr. Leonides Schanz  7) 01/09/2020: Cycle 1 Day 1 of Dara/Velcade/Dex 8) 01/21/2020: presented as urgent visit for diarrhea and dehydration. Holding chemotherapy scheduled for 01/23/2020. 9) 01/30/2020: Resume dara/velcade/dex after resolution of diarrhea.  10) 02/13/2020: restaging labs show M protein 0.8, Kappa 4.5, lamba 17.2, ratio 0.26, urine M protein 53 (7.1%). All MM labs indicate improvement.  11) 03/10/2020: Cycle 4 Day 1 of Dara/Velcade/Dex. Transition to q 3 week daratumumab.  12) 06/04/2020:  Cycle 8 Day 1 of Dara/Velcade/Dex 13) 06/25/2020:  Cycle 9 Day 1 of Dara/Velcade/Dex 14) 07/22/2020: Cycle 10 Day 1  of  Dara//Dex 15) 08/18/2020: Cycle 11 Day 1 of Dara//Dex 16) 09/23/2020: Cycle 12 Day 1 of Dara//Dex 17) 10/22/2020: Cycle 13 Day 1 of Dara/Dex  18) 11/19/2020: Cycle 14 Day 1 of Dara/Dex  19) 12/17/2020: Cycle 15 Day 1 of Dara/Dex  20) 01/17/2021: Cycle 16 Day 1 of Dara/Dex  21) 02/11/2021: Cycle 17 Day 1 of Dara/Dex 22) 03/10/2021: Cycle 18 Day 1 of Dara/Dex  23) 04/08/2021: Cycle 19 Day 1 of Dara/Dex  24) 08/03/2021: Cycle 20 Day 1 of Dara/Dex (delayed due to scheduling error) 25) 09/02/2021: Cycle 21 Day 1 of Dara/Dex 26) 09/30/2021: Cycle 22 Day 1 of Dara/Dex 27) 10/28/2021: Cycle 23 Day 1 of Dara/Dex 28) 12/02/2021: Cycle 24 Day 1 of Dara/Dex 29) 12/30/2021: Cycle 25 Day 1 of Dara/Dex 30) 01/06/2022: Cycle 1 Day 1 of Kyprolis/Dex 31) 02/03/2022: Cycle 2 Day 1 of Kyprolis/Dex 32) 02/24/2022: Cycle 3 Day 1 of Kyprolis/Dex 33) 04/07/2022: Cycle 4 Day 1 of Kyprolis/Dex 34) 05/05/2022: Cycle 5 Day 1 of Kyprolis/Dex 35) 06/01/2022: Cycle 6 Day 1 of Kyprolis/Dex 36) 06/29/2022: Cycle 7 Day 1 of Kyprolis/Dex 37) 07/28/2022: Cycle 8 Day 1 of Kyprolis/Dex 38) 08/25/2022: Cycle 9 Day 1 of Kyprolis/Dex 39) 09/22/2022: Cycle 10 Day 1 of Kyprolis/Dex 40) 10/20/2022: Cycle 11 Day 1 of Kyprolis/Dex 41) 11/17/2022: Cycle 12 Day 1 of Kyprolis/Dex 42 ) 12/22/2022-01/08/2023: Admitted for fournier's gangrene of scrotum.  43) 01/26/2023: Cycle 13 Day 1 of Kyprolis/Dex  Interval History:  [name] 67 y.o. male with medical history significant for  IgG Lambda Multiple Myeloma who presents for a follow up visit. The patient's last visit was on 12/22/2022 when he was admitted and found to have gangrene of the scrotum.  He presents today to resume treatment with Kyprolis/Dex. He is unaccompanied for this visit.  On exam today Mr. Cassetta reports he is feeling better since hospital discharge. He still needs to rest and not overexert himself. He has completed antibiotic therapy and denies any signs of an infection. He denies  nausea, vomiting or abdominal pain. He has good output from his colostomy and good urinary output. He denies any signs of bleeding.He denies fevers, chills, night sweats, shortness of breath, chest pain or abdominal pain.  He has no other complaints.  Rest of the 10 point ROS is below.   MEDICAL HISTORY:  Past Medical History:  Diagnosis Date   Adrenal insufficiency (HCC)    on chronic dexamethasone   Anemia    Cancer (HCC)    Coagulopathy (HCC)    on xeralto/ s/p DVT while on coumadin,  IVC in place   Diabetes mellitus without complication (HCC)    type 2   Glaucoma 12/22/2022   Gross hematuria 01/2013   post foley cath procedure   History of blood transfusion 01/2013   Lower GI bleeding 04/18/2017   Multiple myeloma    thoracic T8 with paraplegia s/p resection- on chemo at visit 10/13/10   Multiple myeloma    Multiple myeloma without mention of remission    Neurogenic bladder    Neurogenic bowel    OSA (obstructive sleep apnea) 11/01/2022   Paraplegia (HCC)    Partial small bowel obstruction (HCC) during dec 2011 admission    SURGICAL HISTORY: Past Surgical History:  Procedure Laterality Date   COLONOSCOPY WITH PROPOFOL N/A 04/12/2017   Procedure: COLONOSCOPY WITH PROPOFOL;  Surgeon: Hilarie Fredrickson, MD;  Location: [facility];  Service: Endoscopy;  Laterality: N/A;   COLONOSCOPY WITH PROPOFOL N/A 04/19/2017   Procedure: COLONOSCOPY WITH PROPOFOL;  Surgeon: Benancio Deeds, MD;  Location: [facility];  Service: Gastroenterology;  Laterality: N/A;   COLOSTOMY  07/20/2011   Procedure: COLOSTOMY;  Surgeon: Rulon Abide, DO;  Location: [facility] OR;  Service: General;;   COLOSTOMY REVISION  07/20/2011   Procedure: COLON RESECTION SIGMOID;  Surgeon: Rulon Abide, DO;  Location: [facility] OR;  Service: General;;   CYSTOSCOPY N/A 04/04/2013   Procedure: CYSTOSCOPY WITH LITHALOPAXY;  Surgeon: Sebastian Ache, MD;  Location: [facility];  Service: Urology;  Laterality: N/A;   INCISION  AND DRAINAGE ABSCESS N/A 12/25/2022   Procedure: INCISION AND DRAINAGE OF SCROTUM;  Surgeon: Despina Arias, MD;  Location: [facility];  Service: Urology;  Laterality: N/A;   INSERTION OF SUPRAPUBIC CATHETER N/A 04/04/2013   Procedure: INSERTION OF SUPRAPUBIC CATHETER;  Surgeon: Sebastian Ache, MD;  Location: [facility];  Service: Urology;  Laterality: N/A;   LAPAROTOMY  07/20/2011   Procedure: EXPLORATORY LAPAROTOMY;  Surgeon: Rulon Abide, DO;  Location: [facility] OR;  Service: General;  Laterality: N/A;   myeloma thoracic T8 with parpaplegia s/p thoracotomy and thoracic T7-9 cage placement on Dec 26th 2011  07/18/10   SCROTAL EXPLORATION N/A 12/29/2022   Procedure: SCROTAL WOUND DEBRIDEMENT AND CLOSURE;  Surgeon: Loletta Parish., MD;  Location: [facility];  Service: Urology;  Laterality: N/A;    SOCIAL HISTORY: Social History   Socioeconomic History   Marital status: Married    Spouse name: Not on file   Number of children: Not on file   Years of education: Not on file   Highest education level: Not on file  Occupational History   Not on file  Tobacco Use   Smoking status: Never   Smokeless tobacco: Never  Vaping Use   Vaping Use:  Never used  Substance and Sexual Activity   Alcohol use: No   Drug use: No   Sexual activity: Never  Other Topics Concern   Not on file  Social History Narrative   Not on file   Social Determinants of Health   Financial Resource Strain: Not on file  Food Insecurity: No Food Insecurity (12/22/2022)   Hunger Vital Sign    Worried About Running Out of Food in the Last Year: Never true    Ran Out of Food in the Last Year: Never true  Transportation Needs: No Transportation Needs (12/22/2022)   PRAPARE - Administrator, Civil Service (Medical): No    Lack of Transportation (Non-Medical): No  Physical Activity: Not on file  Stress: Not on file  Social Connections: Not on file  Intimate Partner Violence: Not At Risk (12/22/2022)   Humiliation,  Afraid, Rape, and Kick questionnaire    Fear of Current or Ex-Partner: No    Emotionally Abused: No    Physically Abused: No    Sexually Abused: No    FAMILY HISTORY: Family History  Problem Relation Age of Onset   Ovarian cancer Mother    Diabetes Father     ALLERGIES:  is allergic to ferumoxytol.  MEDICATIONS:  Current Outpatient Medications  Medication Sig Dispense Refill   acetaminophen (TYLENOL) 325 MG tablet Take 325-650 mg by mouth every 6 (six) hours as needed for headache or mild pain.     acetic acid 0.25 % irrigation Irrigate with 1 Application as directed See admin instructions. Instill 50 ml's, clamp for 10 minutes, then remove clamp and drain the bladder. Repeat 3 times a week- when not using the Renacidin irrigation     acyclovir (ZOVIRAX) 400 MG tablet Take 1 tablet (400 mg total) by mouth 2 (two) times daily. 60 tablet 2   albuterol (VENTOLIN HFA) 108 (90 Base) MCG/ACT inhaler Inhale 2 puffs into the lungs every 4 (four) hours as needed for wheezing or shortness of breath.     atorvastatin (LIPITOR) 20 MG tablet Take 20 mg by mouth daily.     BENADRYL ALLERGY 25 MG tablet Take 25 mg by mouth every 6 (six) hours as needed (for allergic reactions).     BIOFREEZE 4 % GEL Apply 1 application  topically every 4 (four) hours as needed (for joint pain- to intact areas of the skin only).     CHLORASEPTIC MAX SORE THROAT 1.5-33 % LIQD Use as directed 1 spray in the mouth or throat every 2 (two) hours as needed (for a sore throat).     Chlorpheniramine-Acetaminophen (CORICIDIN HBP COLD/FLU PO) Take 1 tablet by mouth every 6 (six) hours as needed (for coughing or nasal congestion).     Citric Ac-Gluconolact-Mg Carb (RENACIDIN IR) Irrigate with 30 mLs as directed See admin instructions. Instill 30 ml's, clamp for 10 minutes, then remove clamp and drain the bladder. Repeat 3 times a week- when not using the acetic acid irrigation     COSOPT PF 2-0.5 % SOLN Place 1 drop into both eyes  2 (two) times daily.     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     Emollient (EUCERIN EX) Apply 1 application  topically See admin instructions. Apply to affected area 2 times a day     FLOVENT HFA 110 MCG/ACT inhaler 2 puffs See admin instructions. 2 sprays to colostomy  stoma prior to each colostomy bag change     guaiFENesin (ROBITUSSIN) 100 MG/5ML liquid Take 10 mLs by mouth every 4 (four) hours as needed for cough or to loosen phlegm.     iron polysaccharides (NU-IRON) 150 MG capsule Take 1 capsule (150 mg total) by mouth daily. 30 capsule 0   latanoprost (XALATAN) 0.005 % ophthalmic solution Place 1 drop into both eyes at bedtime.     Multiple Vitamin (MULTIVITAMIN WITH MINERALS) TABS tablet Take 1 tablet by mouth daily. 30 tablet 0   NON FORMULARY Apply 1 application  topically See admin instructions. Colo Plast paste- Apply as directed with each ostomy bag change     nutrition supplement, JUVEN, (JUVEN) PACK Take 1 packet by mouth 2 (two) times daily between meals. 60 each 0   ondansetron (ZOFRAN) 4 MG tablet Take 1 tablet (4 mg) by mouth every 6 hours as needed for nausea. 20 tablet 0   rivaroxaban (XARELTO) 10 MG TABS tablet Take 1 tablet (10 mg total) by mouth daily with supper. (Patient taking differently: Take 10 mg by mouth daily.) 30 tablet 9   Skin Protectants, Misc. (MINERIN CREME) CREA Apply to affected areas 2 times daily as needed. 454 g 0   Zinc Oxide (BALMEX EX) Apply 1 application  topically See admin instructions. Apply topically daily to affected sites     No current facility-administered medications for this visit.   Facility-Administered Medications Ordered in Other Visits  Medication Dose Route Frequency Provider Last Rate Last Admin   carfilzomib (KYPROLIS) 150 mg in dextrose 5 % 100 mL chemo infusion  70 mg/m2 (Capped) Intravenous Once Ulysees Barns IV, MD       sodium chloride  flush (NS) 0.9 % injection 10 mL  10 mL Intravenous PRN Bertis Ruddy, Ni, MD        REVIEW OF SYSTEMS:   Constitutional: ( - ) fevers, ( - )  chills , ( - ) night sweats Eyes: ( - ) blurriness of vision, ( - ) double vision, ( - ) watery eyes Ears, nose, mouth, throat, and face: ( - ) mucositis, ( - ) sore throat Respiratory: ( - ) cough, ( - ) dyspnea, ( - ) wheezes Cardiovascular: ( - ) palpitation, ( - ) chest discomfort, ( - ) lower extremity swelling Gastrointestinal:  ( - ) nausea, ( - ) heartburn, ( - ) change in bowel habits Skin: ( - ) abnormal skin rashes Lymphatics: ( - ) new lymphadenopathy, ( - ) easy bruising Neurological: ( - ) numbness, ( - ) tingling, ( - ) new weaknesses Behavioral/Psych: ( - ) mood change, ( - ) new changes  All other systems were reviewed with the patient and are negative.  PHYSICAL EXAMINATION: ECOG PERFORMANCE STATUS: paraplegic.   Vitals:   01/26/23 1059  BP: 129/75  Pulse: 95  Resp: 17  Temp: 98.3 F (36.8 C)  SpO2: 100%    There were no vitals filed for this visit.  GENERAL: well appearing middle aged [place] American male alert, no distress and comfortable SKIN: skin color, texture, turgor are normal, no rashes or significant lesions EYES: conjunctiva are pink and non-injected, sclera clear LUNGS: clear to auscultation and percussion with normal breathing effort HEART: regular rate & rhythm and no murmurs Musculoskeletal: no cyanosis of digits and no clubbing  PSYCH: alert & oriented x 3, fluent speech NEURO: paraplegic, no use of LE bilaterally.   LABORATORY DATA:  I have reviewed the data  as listed    Latest Ref Rng & Units 01/26/2023   10:43 AM 01/08/2023    4:56 AM 01/07/2023    6:18 AM  CBC  WBC 4.0 - 10.5 K/uL 8.8  14.5  13.2   Hemoglobin 13.0 - 17.0 g/dL 16.1  8.7  8.7   Hematocrit 39.0 - 52.0 % 30.9  26.9  26.5   Platelets 150 - 400 K/uL 398  359  354        Latest Ref Rng & Units 01/26/2023   10:43 AM 12/31/2022    6:20  AM 12/27/2022    5:19 AM  CMP  Glucose 70 - 99 mg/dL 096  045  98   BUN 8 - 23 mg/dL 8  15  8    Creatinine 0.61 - 1.24 mg/dL 4.09  8.11  9.14   Sodium 135 - 145 mmol/L 142  143  144   Potassium 3.5 - 5.1 mmol/L 3.3  3.5  3.9   Chloride 98 - 111 mmol/L 111  110  110   CO2 22 - 32 mmol/L 24  26  26    Calcium 8.9 - 10.3 mg/dL 8.8  8.3  8.8   Total Protein 6.5 - 8.1 g/dL 6.5     Total Bilirubin 0.3 - 1.2 mg/dL 0.3     Alkaline Phos 38 - 126 U/L 83     AST 15 - 41 U/L 14     ALT 0 - 44 U/L 13       Lab Results  Component Value Date   MPROTEIN 0.2 (H) 11/17/2022   MPROTEIN 0.2 (H) 10/20/2022   MPROTEIN 0.2 (H) 09/22/2022   Lab Results  Component Value Date   KPAFRELGTCHN 10.4 11/17/2022   KPAFRELGTCHN 12.4 10/20/2022   KPAFRELGTCHN 10.3 09/22/2022   LAMBDASER 7.9 11/17/2022   LAMBDASER 9.9 10/20/2022   LAMBDASER 9.9 09/22/2022   KAPLAMBRATIO 1.32 11/17/2022   KAPLAMBRATIO 1.25 10/20/2022   KAPLAMBRATIO 1.04 09/22/2022    RADIOGRAPHIC STUDIES: No results found.  ASSESSMENT & PLAN GREORY WALLETT 67 y.o. male with medical history significant for  IgG Lambda Multiple Myeloma who presents for a follow up visit.  # IgG Lambda Multiple Myeloma, Relapsed (ISS Stage II) --findings are most consistent with relapsed multiple myeloma. Patient previously successfully treated with Velcade/Rev/Dex and Daratumumab/Velcade/Dex. On 07/02/2020 he transitioned to monthly daratumumab alone.  --due to to rise in M protein, switched to Kyprolis and Dexamethasone on 01/06/2022 Plan: --Labs show white blood cell count 8.8, hemoglobin 10.2, MCV 78.6, and platelets of 398, Creatinine and LFTs are normal.  --Resume treatment without any dose modifications.  --Continue with weekly labs plus Kyprolis/Dex treatment as scheduled --RTC in 2 weeks for a toxicity check.  #Sepsis 2/2 UTI and Fournier's gangrene of scrotum: --Hospitalized from 531/2024-01/08/2023. Underwent debridement of necrotic  tissue. --Given PIP tazobactam and Zyvox initially --Urine culture reveals Proteus mirabilis which is resistant to ciprofloxacin and nitrofurantoin --Blood cultures negative --D/C on remainder doxycycline and Augmentin which he completed on 01/07/2023.  --Evaluated by urology on 7/2 and ID on 7/3 with continued wound healing and no further d/c or evidence of infection.   #History of DVT --He had placement of IVC filter, remains on Xarelto. --Due to poor mobility, and lack of bleeding complications, recommend to continue on Xarelto indefinitely. --caution if Plt count were to drop <50  #Snoring/Risk for OSA: --Diagnosed with OSA and has CPAP machine.   # Supportive Care -- provided patient with an albuterol inhaler (for  use with daratumumab) --acyclovir 400mg  BID for VZV prophylaxis --zofran 8mg  q8H PRN for nausea/vomiting  --Zometa is being held in the setting of his prior episode of osteonecrosis of the jaw  No orders of the defined types were placed in this encounter.   All questions were answered. The patient knows to call the clinic with any problems, questions or concerns.  I have spent a total of 30 minutes minutes of face-to-face and non-face-to-face time, preparing to see the patient,  performing a medically appropriate examination, counseling and educating the patient, communicating with other health care professionals, documenting clinical information in the electronic health record,  and care coordination.   Georga Kaufmann PA-C Dept of Hematology and Oncology [facility] Phone: [phone]    01/26/2023 1:07 PM   Literature Support:  Milagros Loll, Petrucci MT, [place], [place], [place], [place], Spada S, [place], Ponticelli E, [place], Cavo M, Di Toritto TC, Haydee Salter F, [place] V, Palumbo A, Boccadoro M, Larocca A. Once-weekly versus twice-weekly carfilzomib in patients with newly diagnosed multiple myeloma:  a pooled analysis of two phase I/II studies. Haematologica. 2019 Aug;104(8):1640-1647.  --Once-weekly 70 mg/m2 carfilzomib as induction and maintenance therapy for newly diagnosed multiple myeloma patients was as safe and effective as twice-weekly 36 mg/m2 carfilzomib and provided a more convenient schedule.

## 2023-01-29 LAB — KAPPA/LAMBDA LIGHT CHAINS
Kappa free light chain: 37.4 mg/L — ABNORMAL HIGH (ref 3.3–19.4)
Kappa, lambda light chain ratio: 1.62 (ref 0.26–1.65)
Lambda free light chains: 23.1 mg/L (ref 5.7–26.3)

## 2023-01-30 ENCOUNTER — Telehealth: Payer: Self-pay | Admitting: *Deleted

## 2023-01-30 NOTE — Telephone Encounter (Signed)
ATC x1.  LVM to return call.  Need to know if patient was set up with a CPAP machine and if he is wearing it.

## 2023-01-31 ENCOUNTER — Encounter: Payer: Self-pay | Admitting: Adult Health

## 2023-01-31 ENCOUNTER — Ambulatory Visit (INDEPENDENT_AMBULATORY_CARE_PROVIDER_SITE_OTHER): Payer: Medicare (Managed Care) | Admitting: Adult Health

## 2023-01-31 VITALS — BP 120/70 | HR 95

## 2023-01-31 DIAGNOSIS — G4733 Obstructive sleep apnea (adult) (pediatric): Secondary | ICD-10-CM

## 2023-01-31 LAB — MULTIPLE MYELOMA PANEL, SERUM
Albumin SerPl Elph-Mcnc: 2.8 g/dL — ABNORMAL LOW (ref 2.9–4.4)
Albumin/Glob SerPl: 0.9 (ref 0.7–1.7)
Alpha 1: 0.3 g/dL (ref 0.0–0.4)
Alpha2 Glob SerPl Elph-Mcnc: 0.9 g/dL (ref 0.4–1.0)
B-Globulin SerPl Elph-Mcnc: 1.1 g/dL (ref 0.7–1.3)
Gamma Glob SerPl Elph-Mcnc: 1.2 g/dL (ref 0.4–1.8)
Globulin, Total: 3.5 g/dL (ref 2.2–3.9)
IgA: 246 mg/dL (ref 61–437)
IgG (Immunoglobin G), Serum: 1524 mg/dL (ref 603–1613)
IgM (Immunoglobulin M), Srm: 46 mg/dL (ref 20–172)
M Protein SerPl Elph-Mcnc: 0.3 g/dL — ABNORMAL HIGH
Total Protein ELP: 6.3 g/dL (ref 6.0–8.5)

## 2023-01-31 NOTE — Addendum Note (Signed)
Addended by: Delrae Rend on: 01/31/2023 11:23 AM   Modules accepted: Orders

## 2023-01-31 NOTE — Assessment & Plan Note (Signed)
Patient is off to good start with excellent control compliance on CPAP.  Has perceived benefit on CPAP.  Decrease auto CPAP to 5 to 15 cm H2O.  Continue with current mask..  Follow-up in 3 months  Plan  Patient Instructions  Decrease Auto CPAP 5 to 15cmH2O. Wear CPAP At bedtime, wear all night long for at least 6hr or more .  Healthy sleep regimen  Work on healthy weight.  Follow up in 3 months and As needed

## 2023-01-31 NOTE — Progress Notes (Signed)
@Patient  ID: [name], male    DOB: May 22, 1956, 67 y.o.   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  He is accompanied by his wife.  Patient says he has started on CPAP and feels like it is working.  Wife says it is working very well he is no longer snoring.  Sleep seems to be more rested.  Patient has had a difficult couple months and has been hospitalized for wound infection.  He was unable to wear the CPAP until recently.  CPAP download shows since starting CPAP 4 days ago he has excellent control with AHI at 0.5.  Usage at 9 hours.  Patient is on auto CPAP 5 to 20 cm H2O.  Daily average pressure at 9.3 cm H2O.  Feels that he benefits from CPAP.  Patient is using a  Full face mask.    Allergies  Allergen Reactions   Ferumoxytol Nausea And Vomiting and Other (See Comments)    The patient felt like he was going to pass out and had chills also     Immunization History  Administered Date(s) Administered   Influenza,inj,Quad PF,6+ Mos 04/20/2017   Influenza-Unspecified 04/23/2014, 05/03/2015, 04/23/2022   Td 04/23/2002   Zoster Recombinant(Shingrix) 03/04/2020    Past Medical History:  Diagnosis Date   Adrenal insufficiency (HCC)    on chronic dexamethasone   Anemia     Cancer (HCC)    Coagulopathy (HCC)    on xeralto/ s/p DVT while on coumadin,  IVC in place   Diabetes mellitus without complication (HCC)    type 2   Glaucoma 12/22/2022   Gross hematuria 01/2013   post foley cath procedure   History of blood transfusion 01/2013   Lower GI bleeding 04/18/2017   Multiple myeloma    thoracic T8 with paraplegia s/p resection- on chemo at visit 10/13/10   Multiple myeloma    Multiple myeloma without mention of remission    Neurogenic bladder    Neurogenic bowel    OSA (obstructive sleep apnea) 11/01/2022   Paraplegia (HCC)    Partial small bowel obstruction (HCC) during dec 2011 admission    Tobacco History: Social History   Tobacco Use  Smoking Status Never  Smokeless Tobacco Never   Counseling given: Not Answered   Outpatient Medications Prior to Visit  Medication Sig Dispense Refill   acetaminophen (TYLENOL) 325 MG tablet Take 325-650 mg by mouth every 6 (six) hours as needed for headache or mild pain.     acetic acid 0.25 % irrigation Irrigate with 1 Application as directed See admin instructions. Instill 50 ml's, clamp for 10 minutes, then remove clamp and drain the bladder. Repeat 3 times a week- when not using the Renacidin irrigation     acyclovir (ZOVIRAX) 400 MG tablet Take  1 tablet (400 mg total) by mouth 2 (two) times daily. 60 tablet 2   albuterol (VENTOLIN HFA) 108 (90 Base) MCG/ACT inhaler Inhale 2 puffs into the lungs every 4 (four) hours as needed for wheezing or shortness of breath.     atorvastatin (LIPITOR) 20 MG tablet Take 20 mg by mouth daily.     BENADRYL ALLERGY 25 MG tablet Take 25 mg by mouth every 6 (six) hours as needed (for allergic reactions).     BIOFREEZE 4 % GEL Apply 1 application  topically every 4 (four) hours as needed (for joint pain- to intact areas of the skin only).     Citric Ac-Gluconolact-Mg Carb (RENACIDIN IR) Irrigate with 30 mLs as directed See admin instructions. Instill 30 ml's, clamp for 10  minutes, then remove clamp and drain the bladder. Repeat 3 times a week- when not using the acetic acid irrigation     COSOPT PF 2-0.5 % SOLN Place 1 drop into both eyes 2 (two) times daily.     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     Emollient (EUCERIN EX) Apply 1 application  topically See admin instructions. Apply to affected area 2 times a day     FLOVENT HFA 110 MCG/ACT inhaler 2 puffs See admin instructions. 2 sprays to colostomy stoma prior to each colostomy bag change     iron polysaccharides (NU-IRON) 150 MG capsule Take 1 capsule (150 mg total) by mouth daily. 30 capsule 0   latanoprost (XALATAN) 0.005 % ophthalmic solution Place 1 drop into both eyes at bedtime.     Multiple Vitamin (MULTIVITAMIN WITH MINERALS) TABS tablet Take 1 tablet by mouth daily. 30 tablet 0   NON FORMULARY Apply 1 application  topically See admin instructions. Colo Plast paste- Apply as directed with each ostomy bag change     ondansetron (ZOFRAN) 4 MG tablet Take 1 tablet (4 mg) by mouth every 6 hours as needed for nausea. 20 tablet 0   rivaroxaban (XARELTO) 10 MG TABS tablet Take 1 tablet (10 mg total) by mouth daily with supper. (Patient taking differently: Take 10 mg by mouth daily.) 30 tablet 9   Skin Protectants, Misc. (MINERIN CREME) CREA Apply to affected areas 2 times daily as needed. 454 g 0   Zinc Oxide (BALMEX EX) Apply 1 application  topically See admin instructions. Apply topically daily to affected sites     CHLORASEPTIC MAX SORE THROAT 1.5-33 % LIQD Use as directed 1 spray in the mouth or throat every 2 (two) hours as needed (for a sore throat). (Patient not taking: Reported on 01/31/2023)     Chlorpheniramine-Acetaminophen (CORICIDIN HBP COLD/FLU PO) Take 1 tablet by mouth every 6 (six) hours as needed (for coughing or nasal congestion). (Patient not taking: Reported on 01/31/2023)     guaiFENesin (ROBITUSSIN) 100  MG/5ML liquid Take 10 mLs by mouth every 4 (four) hours as needed for cough or to loosen phlegm. (Patient not taking: Reported on 01/31/2023)     nutrition supplement, JUVEN, (JUVEN) PACK Take 1 packet by mouth 2 (two) times daily between meals. (Patient not taking: Reported on 01/31/2023) 60 each 0   Facility-Administered Medications Prior to Visit  Medication Dose Route Frequency Provider Last Rate Last Admin   sodium chloride flush (NS) 0.9 % injection 10 mL  10 mL Intravenous PRN Artis Delay, MD  Review of Systems:   Constitutional:   No  weight loss, night sweats,  Fevers, chills, fatigue, or  lassitude.  HEENT:   No headaches,  Difficulty swallowing,  Tooth/dental problems, or  Sore throat,                No sneezing, itching, ear ache, nasal congestion, post nasal drip,   CV:  No chest pain,  Orthopnea, PND, swelling in lower extremities, anasarca, dizziness, palpitations, syncope.   GI  No heartburn, indigestion, abdominal pain, nausea, vomiting, diarrhea, change in bowel habits, loss of appetite, bloody stools.   Resp: No shortness of breath with exertion or at rest.  No excess mucus, no productive cough,  No non-productive cough,  No coughing up of blood.  No change in color of mucus.  No wheezing.  No chest wall deformity  Skin: no rash or lesions.  GU: no dysuria, change in color of urine, no urgency or frequency.  No flank pain, no hematuria   MS:  No joint pain or swelling.  No decreased range of motion.  No back pain.    Physical Exam  GEN: A/Ox3; pleasant , NAD, well nourished , power wc    HEENT:  [place]/AT,  NOSE-clear, THROAT-clear, no lesions, no postnasal drip or exudate noted. Class 3 MP airway   NECK:  Supple w/ fair ROM; no JVD; normal carotid impulses w/o bruits; no thyromegaly or nodules palpated; no lymphadenopathy.    RESP  Clear  P & A; w/o, wheezes/ rales/ or rhonchi. no accessory muscle use, no dullness to percussion  CARD:  RRR, no m/r/g, tr   peripheral edema, pulses intact, no cyanosis or clubbing.  GI:   Soft & nt; nml bowel sounds; no organomegaly or masses detected.  Colostomy  GU Cath .  Musco: Warm bil, no deformities or joint swelling noted.   Neuro: alert, +paraplegic   Skin: Warm, no lesions or rashes    Lab Results:  CBC   BMET   BNP No results found for: "BNP"  ProBNP No results found for: "PROBNP"  Imaging: No results found.  carfilzomib (KYPROLIS) 150 mg in dextrose 5 % 100 mL chemo infusion     Date Action Dose Route User   01/26/2023 1314 Infusion Verify (none) Intravenous Yehuda Budd, RN   01/26/2023 1314 Rate/Dose Change (none) Intravenous Yehuda Budd, RN   01/26/2023 1313 New Bag/Given 150 mg Intravenous York, Amanda C, RN      dexamethasone (DECADRON) 40 mg in sodium chloride 0.9 % 50 mL IVPB     Date Action Dose Route User   01/26/2023 1212 Rate/Dose Change (none) Intravenous Yehuda Budd, RN   01/26/2023 1211 New Bag/Given 40 mg Intravenous York, Amanda C, RN      ondansetron [facility]) injection 8 mg     Date Action Dose Route User   Discharged on 01/08/2023   Admitted on 12/22/2022   12/22/2022 1115 Given 8 mg Intravenous Gerrit Halls, RN      0.9 %  sodium chloride infusion     Date Action Dose Route User   Discharged on 01/08/2023   Admitted on 12/22/2022   12/22/2022 1248 Rate/Dose Change (none) Intravenous Gerrit Halls, RN   12/22/2022 1049 New Bag/Given (none) Intravenous Gerrit Halls, RN      0.9 %  sodium chloride infusion     Date Action Dose Route User   Discharged on 01/08/2023   12/22/2022 1457 Infusion Verify (none) Intravenous  Gerrit Halls, RN   Admitted on 12/22/2022   12/22/2022 1251 New Bag/Given (none) Intravenous Gerrit Halls, RN      0.9 %  sodium chloride infusion     Date Action Dose Route User   01/26/2023 1207 New Bag/Given (none) Intravenous York, Amanda C, RN      0.9 %  sodium chloride infusion     Date Action Dose Route User    01/26/2023 1351 Rate/Dose Change (none) Intravenous Yehuda Budd, RN   01/26/2023 1346 Rate/Dose Change (none) Intravenous Yehuda Budd, RN   01/26/2023 1304 Infusion Verify (none) Intravenous Yehuda Budd, RN   01/26/2023 1304 Rate/Dose Change (none) Intravenous Yehuda Budd, RN   01/26/2023 1234 Infusion Verify (none) Intravenous York, Amanda C, RN           No data to display          No results found for: "NITRICOXIDE"      Assessment & Plan:   OSA (obstructive sleep apnea) Patient is off to good start with excellent control compliance on CPAP.  Has perceived benefit on CPAP.  Decrease auto CPAP to 5 to 15 cm H2O.  Continue with current mask..  Follow-up in 3 months  Plan  Patient Instructions  Decrease Auto CPAP 5 to 15cmH2O. Wear CPAP At bedtime, wear all night long for at least 6hr or more .  Healthy sleep regimen  Work on healthy weight.  Follow up in 3 months and As needed          Rubye Oaks, NP 01/31/2023

## 2023-01-31 NOTE — Telephone Encounter (Signed)
Pt's wife calling. They got machine late April but then he got sick and got Covid. It was real bad for 3 weeks, then went to the hospital, they had a procedure and he was not up to himself so they actually just started on the machine last Friday. If you have any further questions please call her @ [phone]

## 2023-01-31 NOTE — Patient Instructions (Addendum)
Decrease Auto CPAP 5 to 15cmH2O. Wear CPAP At bedtime, wear all night long for at least 6hr or more .  Healthy sleep regimen  Work on healthy weight.  Follow up in 3 months and As needed

## 2023-02-02 ENCOUNTER — Inpatient Hospital Stay: Payer: Medicare (Managed Care)

## 2023-02-02 ENCOUNTER — Other Ambulatory Visit: Payer: Self-pay

## 2023-02-02 VITALS — BP 124/72 | HR 87 | Temp 98.4°F | Resp 18

## 2023-02-02 DIAGNOSIS — Z5111 Encounter for antineoplastic chemotherapy: Secondary | ICD-10-CM | POA: Diagnosis not present

## 2023-02-02 DIAGNOSIS — C9002 Multiple myeloma in relapse: Secondary | ICD-10-CM

## 2023-02-02 LAB — CMP (CANCER CENTER ONLY)
ALT: 17 U/L (ref 0–44)
AST: 15 U/L (ref 15–41)
Albumin: 3.2 g/dL — ABNORMAL LOW (ref 3.5–5.0)
Alkaline Phosphatase: 81 U/L (ref 38–126)
Anion gap: 5 (ref 5–15)
BUN: 10 mg/dL (ref 8–23)
CO2: 27 mmol/L (ref 22–32)
Calcium: 9.1 mg/dL (ref 8.9–10.3)
Chloride: 113 mmol/L — ABNORMAL HIGH (ref 98–111)
Creatinine: 0.67 mg/dL (ref 0.61–1.24)
GFR, Estimated: >60 mL/min (ref >60)
Glucose, Bld: 117 mg/dL — ABNORMAL HIGH (ref 70–99)
Potassium: 3.6 mmol/L (ref 3.5–5.1)
Sodium: 145 mmol/L (ref 135–145)
Total Bilirubin: 0.4 mg/dL (ref 0.3–1.2)
Total Protein: 6.2 g/dL — ABNORMAL LOW (ref 6.5–8.1)

## 2023-02-02 LAB — CBC WITH DIFFERENTIAL (CANCER CENTER ONLY)
Abs Immature Granulocytes: 0.03 10*3/uL (ref 0.00–0.07)
Basophils Absolute: 0 10*3/uL (ref 0.0–0.1)
Basophils Relative: 0 %
Eosinophils Absolute: 0.3 10*3/uL (ref 0.0–0.5)
Eosinophils Relative: 3 %
HCT: 30.4 % — ABNORMAL LOW (ref 39.0–52.0)
Hemoglobin: 10.4 g/dL — ABNORMAL LOW (ref 13.0–17.0)
Immature Granulocytes: 0 %
Lymphocytes Relative: 10 %
Lymphs Abs: 1 10*3/uL (ref 0.7–4.0)
MCH: 26.5 pg (ref 26.0–34.0)
MCHC: 34.2 g/dL (ref 30.0–36.0)
MCV: 77.4 fL — ABNORMAL LOW (ref 80.0–100.0)
Monocytes Absolute: 0.8 10*3/uL (ref 0.1–1.0)
Monocytes Relative: 8 %
Neutro Abs: 8.1 10*3/uL — ABNORMAL HIGH (ref 1.7–7.7)
Neutrophils Relative %: 79 %
Platelet Count: 210 10*3/uL (ref 150–400)
RBC: 3.93 MIL/uL — ABNORMAL LOW (ref 4.22–5.81)
RDW: 20.4 % — ABNORMAL HIGH (ref 11.5–15.5)
WBC Count: 10.2 10*3/uL (ref 4.0–10.5)
nRBC: 0 % (ref 0.0–0.2)

## 2023-02-02 MED ORDER — HEPARIN SOD (PORK) LOCK FLUSH 100 UNIT/ML IV SOLN: 500.0000 [IU] | Freq: Once | INTRAVENOUS | Status: DC | PRN

## 2023-02-02 MED ORDER — SODIUM CHLORIDE 0.9% FLUSH: 10.0000 mL | INTRAVENOUS | Status: DC | PRN

## 2023-02-02 MED ORDER — SODIUM CHLORIDE 0.9 % IV SOLN: Freq: Once | INTRAVENOUS | Status: AC

## 2023-02-02 MED ORDER — SODIUM CHLORIDE 0.9 % IV SOLN
40.0000 mg | Freq: Once | INTRAVENOUS | Status: AC
  Administered 2023-02-02: 40 mg via INTRAVENOUS
  Filled 2023-02-02: qty 4

## 2023-02-02 MED ORDER — DEXTROSE 5 % IV SOLN
70.0000 mg/m2 | Freq: Once | INTRAVENOUS | Status: AC
  Administered 2023-02-02: 150 mg via INTRAVENOUS
  Filled 2023-02-02: qty 60

## 2023-02-02 NOTE — Patient Instructions (Signed)
[place] [facility]  Discharge Instructions: Thank you for choosing [facility] to provide your oncology and hematology care.   If you have a lab appointment with the Cancer Center, please go directly to the Cancer Center and check in at the registration area.   Wear comfortable clothing and clothing appropriate for easy access to any Portacath or PICC line.   We strive to give you quality time with your provider. You may need to reschedule your appointment if you arrive late (15 or more minutes).  Arriving late affects you and other patients whose appointments are after yours.  Also, if you miss three or more appointments without notifying the office, you may be dismissed from the clinic at the provider's discretion.      For prescription refill requests, have your pharmacy contact our office and allow 72 hours for refills to be completed.    Today you received the following chemotherapy and/or immunotherapy agents: Kyrolis.       To help prevent nausea and vomiting after your treatment, we encourage you to take your nausea medication as directed.  BELOW ARE SYMPTOMS THAT SHOULD BE REPORTED IMMEDIATELY: *FEVER GREATER THAN 100.4 F (38 C) OR HIGHER *CHILLS OR SWEATING *NAUSEA AND VOMITING THAT IS NOT CONTROLLED WITH YOUR NAUSEA MEDICATION *UNUSUAL SHORTNESS OF BREATH *UNUSUAL BRUISING OR BLEEDING *URINARY PROBLEMS (pain or burning when urinating, or frequent urination) *BOWEL PROBLEMS (unusual diarrhea, constipation, pain near the anus) TENDERNESS IN MOUTH AND THROAT WITH OR WITHOUT PRESENCE OF ULCERS (sore throat, sores in mouth, or a toothache) UNUSUAL RASH, SWELLING OR PAIN  UNUSUAL VAGINAL DISCHARGE OR ITCHING   Items with * indicate a potential emergency and should be followed up as soon as possible or go to the Emergency Department if any problems should occur.  Please show the CHEMOTHERAPY ALERT CARD or IMMUNOTHERAPY ALERT CARD at  check-in to the Emergency Department and triage nurse.  Should you have questions after your visit or need to cancel or reschedule your appointment, please contact [place] [facility]  Dept: [phone]  and follow the prompts.  Office hours are 8:00 a.m. to 4:30 p.m. Monday - Friday. Please note that voicemails left after 4:00 p.m. may not be returned until the following business day.  We are closed weekends and major holidays. You have access to a nurse at all times for urgent questions. Please call the main number to the clinic Dept: [phone] and follow the prompts.   For any non-urgent questions, you may also contact your provider using MyChart. We now offer e-Visits for anyone 34 and older to request care online for non-urgent symptoms. For details visit [email or web address].   Also download the MyChart app! Go to the app store, search "MyChart", open the app, select [facility], and log in with your MyChart username and password.

## 2023-02-02 NOTE — Progress Notes (Signed)
Reviewed and agree with assessment/plan.   Coralyn Helling, MD [facility]monary/Critical [facility] 02/02/2023, 8:54 AM Pager:  [phone]

## 2023-02-09 ENCOUNTER — Ambulatory Visit: Payer: Medicare (Managed Care)

## 2023-02-09 ENCOUNTER — Ambulatory Visit: Payer: Medicare (Managed Care) | Admitting: Physician Assistant

## 2023-02-09 ENCOUNTER — Other Ambulatory Visit: Payer: Medicare (Managed Care)

## 2023-02-09 NOTE — Telephone Encounter (Signed)
Pt. Seen in the office, situation addressed, nothing further needed.

## 2023-02-13 ENCOUNTER — Encounter (HOSPITAL_BASED_OUTPATIENT_CLINIC_OR_DEPARTMENT_OTHER): Payer: Medicare (Managed Care) | Attending: Internal Medicine | Admitting: Internal Medicine

## 2023-02-13 DIAGNOSIS — N493 Fournier gangrene: Secondary | ICD-10-CM | POA: Diagnosis not present

## 2023-02-13 DIAGNOSIS — C9000 Multiple myeloma not having achieved remission: Secondary | ICD-10-CM | POA: Diagnosis not present

## 2023-02-13 DIAGNOSIS — S31501A Unspecified open wound of unspecified external genital organs, male, initial encounter: Secondary | ICD-10-CM

## 2023-02-13 DIAGNOSIS — G8220 Paraplegia, unspecified: Secondary | ICD-10-CM | POA: Diagnosis not present

## 2023-02-13 DIAGNOSIS — L98492 Non-pressure chronic ulcer of skin of other sites with fat layer exposed: Secondary | ICD-10-CM

## 2023-02-13 DIAGNOSIS — X58XXXA Exposure to other specified factors, initial encounter: Secondary | ICD-10-CM | POA: Diagnosis not present

## 2023-02-13 NOTE — Progress Notes (Signed)
ALBI, RAPPAPORT ([id number]) [id number] Page 1 of 9 Visit Report for 02/13/2023 Allergy List Details Patient Name: Date of Service: [name], [name] 02/13/2023 9:30 A M Medical Record Number: [id number] Patient Account Number: [id number] Date of Birth/Sex: Treating RN: April 16, 1956 (67 Howell.o. Tammy Sours Primary Care Kieanna Rollo: Jethro Bastos Other Clinician: Referring Rito Lecomte: Treating Demitrus Francisco/Extender: Vista Lawman in Treatment: 0 Allergies Active Allergies ferumoxytol Allergy Notes Electronic Signature(s) Signed: 02/13/2023 4:56:09 PM By: Shawn Stall RN, BSN Entered By: Shawn Stall on 02/13/2023 07:21:53 -------------------------------------------------------------------------------- Arrival Information Details Patient Name: Date of Service: Gary Fass. 02/13/2023 9:30 A M Medical Record Number: [id number] Patient Account Number: [id number] Date of Birth/Sex: Treating RN: Jun 12, 1956 (31 Howell.o. Tammy Sours Primary Care Azyriah Nevins: Jethro Bastos Other Clinician: Referring Dewey Viens: Treating Taiden Raybourn/Extender: Vista Lawman in Treatment: 0 Visit Information Patient Arrived: Wheel Chair Arrival Time: 09:39 Accompanied By: self Transfer Assistance: Michiel Sites Lift Patient Identification Verified: Yes Secondary Verification Process Completed: Yes Patient Requires Transmission-Based Precautions: No Patient Has Alerts: Yes Patient Alerts: Patient on Blood Thinner Xarelto History Since Last Visit Added or deleted any medications: No Any new allergies or adverse reactions: No Had a fall or experienced change in activities of daily living that may affect risk of falls: No Signs or symptoms of abuse/neglect since last visito No Hospitalized since last visit: No Implantable device outside of the clinic excluding cellular tissue based products placed in the center since last visit:  No Electronic Signature(s) Signed: 02/13/2023 4:56:09 PM By: Shawn Stall RN, BSN Entered By: Shawn Stall on 02/13/2023 09:42:47 [name] ([id number]) [id number] Page 2 of 9 -------------------------------------------------------------------------------- Clinic Level of Care Assessment Details Patient Name: Date of Service: Gary Fass. 02/13/2023 9:30 A M Medical Record Number: [id number] Patient Account Number: [id number] Date of Birth/Sex: Treating RN: July 19, 1956 (23 Howell.o. Tammy Sours Primary Care Zyden Suman: Jethro Bastos Other Clinician: Referring Jaken Fregia: Treating Kraven Calk/Extender: Vista Lawman in Treatment: 0 Clinic Level of Care Assessment Items TOOL 2 Quantity Score X- 1 0 Use when only an EandM is performed on the INITIAL visit ASSESSMENTS - Nursing Assessment / Reassessment X- 1 20 General Physical Exam (combine w/ comprehensive assessment (listed just below) when performed on new pt. evals) X- 1 25 Comprehensive Assessment (HX, ROS, Risk Assessments, Wounds Hx, etc.) ASSESSMENTS - Wound and Skin A ssessment / Reassessment X - Simple Wound Assessment / Reassessment - one wound 1 5 []  - 0 Complex Wound Assessment / Reassessment - multiple wounds X- 1 10 Dermatologic / Skin Assessment (not related to wound area) ASSESSMENTS - Ostomy and/or Continence Assessment and Care []  - 0 Incontinence Assessment and Management []  - 0 Ostomy Care Assessment and Management (repouching, etc.) PROCESS - Coordination of Care X - Simple Patient / Family Education for ongoing care 1 15 []  - 0 Complex (extensive) Patient / Family Education for ongoing care X- 1 10 Staff obtains Chiropractor, Records, T Results / Process Orders est X- 1 10 Staff telephones HHA, Nursing Homes / Clarify orders / etc []  - 0 Routine Transfer to another Facility (non-emergent condition) []  - 0 Routine Hospital Admission  (non-emergent condition) X- 1 15 New Admissions / Manufacturing engineer / Ordering NPWT Apligraf, etc. , []  - 0 Emergency Hospital Admission (emergent condition) X- 1 10 Simple Discharge Coordination []  - 0 Complex (extensive) Discharge Coordination PROCESS - Special Needs []  - 0 Pediatric / Minor Patient  Management []  - 0 Isolation Patient Management []  - 0 Hearing / Language / Visual special needs []  - 0 Assessment of Community assistance (transportation, D/C planning, etc.) []  - 0 Additional assistance / Altered mentation []  - 0 Support Surface(s) Assessment (bed, cushion, seat, etc.) INTERVENTIONS - Wound Cleansing / Measurement X- 1 5 Wound Imaging (photographs - any number of wounds) []  - 0 Wound Tracing (instead of photographs) X- 1 5 Simple Wound Measurement - one wound []  - 0 Complex Wound Measurement - multiple wounds X- 1 5 Simple Wound Cleansing - one wound []  - 0 Complex Wound Cleansing - multiple wounds INTERVENTIONS - Wound Dressings X - Small Wound Dressing one or multiple wounds 1 10 [name], [name] ([id number]) [id number] Page 3 of 9 []  - 0 Medium Wound Dressing one or multiple wounds []  - 0 Large Wound Dressing one or multiple wounds []  - 0 Application of Medications - injection INTERVENTIONS - Miscellaneous []  - 0 External ear exam []  - 0 Specimen Collection (cultures, biopsies, blood, body fluids, etc.) []  - 0 Specimen(s) / Culture(s) sent or taken to Lab for analysis X- 1 10 Patient Transfer (multiple staff / Nurse, adult / Similar devices) []  - 0 Simple Staple / Suture removal (25 or less) []  - 0 Complex Staple / Suture removal (26 or more) []  - 0 Hypo / Hyperglycemic Management (close monitor of Blood Glucose) []  - 0 Ankle / Brachial Index (ABI) - Gary not check if billed separately Has the patient been seen at the hospital within the last three years: Yes Total Score: 155 Level Of Care: New/Established  - Level 4 Electronic Signature(s) Signed: 02/13/2023 4:56:09 PM By: Shawn Stall RN, BSN Entered By: Shawn Stall on 02/13/2023 10:27:42 -------------------------------------------------------------------------------- Encounter Discharge Information Details Patient Name: Date of Service: Gary Fass. 02/13/2023 9:30 A M Medical Record Number: [id number] Patient Account Number: [id number] Date of Birth/Sex: Treating RN: [birth year] (68 Howell.o. Tammy Sours Primary Care Berea Majkowski: Jethro Bastos Other Clinician: Referring Kale Dols: Treating Nakhia Levitan/Extender: Vista Lawman in Treatment: 0 Encounter Discharge Information Items Discharge Condition: Stable Ambulatory Status: Wheelchair Discharge Destination: Home Transportation: Private Auto Accompanied By: self Schedule Follow-up Appointment: Yes Clinical Summary of Care: Electronic Signature(s) Signed: 02/13/2023 4:56:09 PM By: Shawn Stall RN, BSN Entered By: Shawn Stall on 02/13/2023 10:28:12 -------------------------------------------------------------------------------- Lower Extremity Assessment Details Patient Name: Date of Service: Gary Berthold S W. 02/13/2023 9:30 A M Medical Record Number: [id number] Patient Account Number: [id number] Date of Birth/Sex: Treating RN: 03-18-1956 (58 Howell.o. Tammy Sours Primary Care Lyle Leisner: Jethro Bastos Other Clinician: Referring Jamica Woodyard: Treating Haley Fuerstenberg/Extender: Vista Lawman in Treatment: [address] [place] ([id number]) [id number] Page 4 of 9 Notes NO BLE wounds. Electronic Signature(s) Signed: 02/13/2023 4:56:09 PM By: Shawn Stall RN, BSN Entered By: Shawn Stall on 02/13/2023 09:48:04 -------------------------------------------------------------------------------- Multi Wound Chart Details Patient Name: Date of Service: Gary Fass. 02/13/2023 9:30 A M Medical  Record Number: [id number] Patient Account Number: [id number] Date of Birth/Sex: Treating RN: 22-Apr-1956 (89 Howell.o. M) Primary Care Eluterio Seymour: Jethro Bastos Other Clinician: Referring Rachel Rison: Treating Daryan Cagley/Extender: Vista Lawman in Treatment: 0 Vital Signs Height(in): 68 Pulse(bpm): 75 Weight(lbs): 230 Blood Pressure(mmHg): 110/73 Body Mass Index(BMI): 35 Temperature(F): 98.3 Respiratory Rate(breaths/min): 20 [4:Photos:] [N/A:N/A] Right Groin N/A N/A Wound Location: Bump N/A N/A Wounding Event: Open Surgical Wound N/A N/A Primary Etiology: Glaucoma, Anemia, Sleep Apnea, N/A N/A Comorbid History:  Deep Vein Thrombosis, Type II Diabetes, Paraplegia 12/25/2022 N/A N/A Date Acquired: 0 N/A N/A Weeks of Treatment: Open N/A N/A Wound Status: No N/A N/A Wound Recurrence: 6.5x2x3 N/A N/A Measurements L x W x D (cm) 10.21 N/A N/A A (cm) : rea 30.631 N/A N/A Volume (cm) : Full Thickness Without Exposed N/A N/A Classification: Support Structures Medium N/A N/A Exudate Amount: Serosanguineous N/A N/A Exudate Type: red, brown N/A N/A Exudate Color: Epibole N/A N/A Wound Margin: Large (67-100%) N/A N/A Granulation Amount: Red N/A N/A Granulation Quality: None Present (0%) N/A N/A Necrotic Amount: Fat Layer (Subcutaneous Tissue): Yes N/A N/A Exposed Structures: Fascia: No Tendon: No Muscle: No Joint: No Bone: No None N/A N/A Epithelialization: Excoriation: No N/A N/A Periwound Skin Texture: Induration: No Callus: No Crepitus: No Rash: No Scarring: No Maceration: No N/A N/A [address] AvenueBAYLER, GEHRIG ([id number]) [id number] Page 5 of 9 Dry/Scaly: No Atrophie Blanche: No N/A N/A Periwound Skin Color: Cyanosis: No Ecchymosis: No Erythema: No Hemosiderin Staining: No Mottled: No Pallor: No Rubor: No x4 sutures noted. N/A N/A Assessment Notes: Treatment Notes Wound #4 (Groin)  Wound Laterality: Right Cleanser Wound Cleanser Discharge Instruction: Cleanse the wound with wound cleanser prior to applying a clean dressing using gauze sponges, not tissue or cotton balls. Peri-Wound Care Topical Primary Dressing Vashe wet to dry Discharge Instruction: apply directly to wound bed. lightly pack. Secondary Dressing ABD Pad, 5x9 Discharge Instruction: Apply over primary dressing as directed. Secured With Compression Wrap Compression Stockings Facilities manager) Signed: 02/13/2023 4:26:53 PM By: [name] Gary Entered By: [name] on 02/13/2023 10:58:57 -------------------------------------------------------------------------------- Multi-Disciplinary Care Plan Details Patient Name: Date of Service: Gary Fass. 02/13/2023 9:30 A M Medical Record Number: [id number] Patient Account Number: [id number] Date of Birth/Sex: Treating RN: 01/29/1956 (72 Howell.o. Tammy Sours Primary Care Shateka Petrea: Jethro Bastos Other Clinician: Referring Jamale Spangler: Treating Debbe Crumble/Extender: Vista Lawman in Treatment: 0 Active Inactive Orientation to the Wound Care Program Nursing Diagnoses: Knowledge deficit related to the wound healing center program Goals: Patient/caregiver will verbalize understanding of the Wound Healing Center Program Date Initiated: 02/13/2023 Target Resolution Date: 03/01/2023 Goal Status: Active Interventions: Provide education on orientation to the wound center [place] ([id number]) [phone].pdf Page 6 of 9 Notes: Pain, Acute or Chronic Nursing Diagnoses: Pain, acute or chronic: actual or potential Potential alteration in comfort, pain Goals: Patient will verbalize adequate pain control and receive pain control interventions during procedures as needed Date Initiated: 02/13/2023 Target Resolution Date: 03/02/2023 Goal Status: Active Interventions: Encourage  patient to take pain medications as prescribed Provide education on pain management Notes: Wound/Skin Impairment Nursing Diagnoses: Knowledge deficit related to ulceration/compromised skin integrity Goals: Patient/caregiver will verbalize understanding of skin care regimen Date Initiated: 02/13/2023 Target Resolution Date: 03/02/2023 Goal Status: Active Interventions: Assess patient/caregiver ability to perform ulcer/skin care regimen upon admission and as needed Assess ulceration(s) every visit Provide education on ulcer and skin care Treatment Activities: Skin care regimen initiated : 02/13/2023 Topical wound management initiated : 02/13/2023 Notes: Electronic Signature(s) Signed: 02/13/2023 4:56:09 PM By: Shawn Stall RN, BSN Entered By: Shawn Stall on 02/13/2023 10:08:11 -------------------------------------------------------------------------------- Pain Assessment Details Patient Name: Date of Service: Gary Perch W. 02/13/2023 9:30 A M Medical Record Number: [id number] Patient Account Number: [id number] Date of Birth/Sex: Treating RN: Feb 07, 1956 (53 Howell.o. Tammy Sours Primary Care Dalayza Zambrana: Jethro Bastos Other Clinician: Referring Deiona Hooper: Treating Birgitta Uhlir/Extender: Vista Lawman in  Treatment: 0 Active Problems Location of Pain Severity and Description of Pain Patient Has Paino No Site Locations [place], [place] ([id number]) [id number] Page 7 of 9 Pain Management and Medication Current Pain Management: Electronic Signature(s) Signed: 02/13/2023 4:56:09 PM By: Shawn Stall RN, BSN Entered By: Shawn Stall on 02/13/2023 09:48:14 -------------------------------------------------------------------------------- Patient/Caregiver Education Details Patient Name: Date of Service: [name], Gary UGLA S W. 7/23/2024andnbsp9:30 A M Medical Record Number: [id number] Patient Account Number: [id number] Date of  Birth/Gender: Treating RN: May 08, 1956 (89 Howell.o. Tammy Sours Primary Care Physician: Jethro Bastos Other Clinician: Referring Physician: Treating Physician/Extender: Vista Lawman in Treatment: 0 Education Assessment Education Provided To: Patient Education Topics Provided Wound/Skin Impairment: Handouts: Caring for Your Ulcer Methods: Explain/Verbal Responses: Reinforcements needed Electronic Signature(s) Signed: 02/13/2023 4:56:09 PM By: Shawn Stall RN, BSN Entered By: Shawn Stall on 02/13/2023 10:26:59 -------------------------------------------------------------------------------- Wound Assessment Details Patient Name: Date of Service: Gary Fass. 02/13/2023 9:30 A M Medical Record Number: [id number] Patient Account Number: [id number] Date of Birth/Sex: Treating RN: 1956-07-01 (32 Howell.o. Tammy Sours Primary Care Cove Haydon: Jethro Bastos Other Clinician: [name] ([id number]) [id number] Page 8 of 9 Referring Lakevia Perris: Treating Kerrie Latour/Extender: Vista Lawman in Treatment: 0 Wound Status Wound Number: 4 Primary Open Surgical Wound Etiology: Wound Location: Right Groin Wound Open Wounding Event: Bump Status: Date Acquired: 12/25/2022 Comorbid Glaucoma, Anemia, Sleep Apnea, Deep Vein Thrombosis, Type Weeks Of Treatment: 0 History: II Diabetes, Paraplegia Clustered Wound: No Wound under treatment by Estil Vallee outside of Wound Center Photos Wound Measurements Length: (cm) 6.5 Width: (cm) 2 Depth: (cm) 3 Area: (cm) 10.21 Volume: (cm) 30.631 % Reduction in Area: % Reduction in Volume: Epithelialization: None Tunneling: No Undermining: No Wound Description Classification: Full Thickness Without Exposed Support Structures Wound Margin: Epibole Exudate Amount: Medium Exudate Type: Serosanguineous Exudate Color: red, brown Foul Odor After Cleansing:  No Slough/Fibrino No Wound Bed Granulation Amount: Large (67-100%) Exposed Structure Granulation Quality: Red Fascia Exposed: No Necrotic Amount: None Present (0%) Fat Layer (Subcutaneous Tissue) Exposed: Yes Tendon Exposed: No Muscle Exposed: No Joint Exposed: No Bone Exposed: No Periwound Skin Texture Texture Color No Abnormalities Noted: No No Abnormalities Noted: No Callus: No Atrophie Blanche: No Crepitus: No Cyanosis: No Excoriation: No Ecchymosis: No Induration: No Erythema: No Rash: No Hemosiderin Staining: No Scarring: No Mottled: No Pallor: No Moisture Rubor: No No Abnormalities Noted: No Dry / Scaly: No Maceration: No Assessment Notes x4 sutures noted. Treatment Notes Wound #4 (Groin) Wound Laterality: Right Cleanser Wound Cleanser Discharge Instruction: Cleanse the wound with wound cleanser prior to applying a clean dressing using gauze sponges, not tissue or cotton balls. AXEL, FRISK ([id number]) [id number] Page 9 of 9 Peri-Wound Care Topical Primary Dressing Vashe wet to dry Discharge Instruction: apply directly to wound bed. lightly pack. Secondary Dressing ABD Pad, 5x9 Discharge Instruction: Apply over primary dressing as directed. Secured With Compression Wrap Compression Stockings Facilities manager) Signed: 02/13/2023 4:56:09 PM By: Shawn Stall RN, BSN Entered By: Shawn Stall on 02/13/2023 10:04:33 -------------------------------------------------------------------------------- Vitals Details Patient Name: Date of Service: Gary Fass. 02/13/2023 9:30 A M Medical Record Number: [id number] Patient Account Number: [id number] Date of Birth/Sex: Treating RN: 1955-11-04 (59 Howell.o. Tammy Sours Primary Care Maryah Marinaro: Jethro Bastos Other Clinician: Referring Trystan Akhtar: Treating Jettie Lazare/Extender: Vista Lawman in Treatment: 0 Vital Signs Time Taken:  09:43 Temperature (F): 98.3 Height (in): 68 Pulse (bpm): 75 Source:  Stated Respiratory Rate (breaths/min): 20 Weight (lbs): 230 Blood Pressure (mmHg): 110/73 Source: Stated Reference Range: 80 - 120 mg / dl Body Mass Index (BMI): 35 Electronic Signature(s) Signed: 02/13/2023 4:56:09 PM By: Shawn Stall RN, BSN Entered By: Shawn Stall on 02/13/2023 09:44:48

## 2023-02-13 NOTE — Progress Notes (Signed)
AVETT, REINECK ([id number]) [id number] Page 1 of 7 Visit Report for 02/13/2023 Chief Complaint Document Details Patient Name: Date of Service: [name]. 02/13/2023 9:30 A M Medical Record Number: [id number] Patient Account Number: [id number] Date of Birth/Sex: Treating RN: 02/15/1956 (67 y.o. M) Primary Care Provider: Jethro Bastos Other Clinician: Referring Provider: Treating Provider/Extender: Vista Lawman in Treatment: 0 Information Obtained from: Patient Chief Complaint 02/13/2023; right groin wound Electronic Signature(s) Signed: 02/13/2023 4:26:53 PM By: [name] DO Entered By: [name] on 02/13/2023 10:59:25 -------------------------------------------------------------------------------- HPI Details Patient Name: Date of Service: [name]. 02/13/2023 9:30 A M Medical Record Number: [id number] Patient Account Number: [id number] Date of Birth/Sex: Treating RN: 1955-11-26 (67 y.o. M) Primary Care Provider: Jethro Bastos Other Clinician: Referring Provider: Treating Provider/Extender: Vista Lawman in Treatment: 0 History of Present Illness HPI Description: 02/13/2023 Mr. Damarri Rampy III is a 67 year old male with a past medical history of paraplegia and multiple myeloma that presents to the clinic for a 1-79-month history of wound to his right groin. Patient developed an abscess and required incision and drainage of the scrotum on 6/3 and 6/7 by Dr. Angelica Chessman, urology. He was diagnosed with foreigners gangrene. He completed a course of doxycycline and Augmentin. He followed with infectious disease with no further current recommendations. He currently denies signs of infection. He is using normal saline wet-to-dry dressings. He has no issues or complaints. He still has 5 sutures in place that are not keeping the skin together. Electronic  Signature(s) Signed: 02/13/2023 4:26:53 PM By: [name] DO Entered By: [name] on 02/13/2023 11:05:10 -------------------------------------------------------------------------------- Physical Exam Details Patient Name: Date of Service: [name]. 02/13/2023 9:30 A M Medical Record Number: [id number] Patient Account Number: [id number] Date of Birth/Sex: Treating RN: Oct 06, 1955 (67 y.o. M) Primary Care Provider: Jethro Bastos Other Clinician: [name] ([id number]) [id number] Page 2 of 7 Referring Provider: Treating Provider/Extender: Vista Lawman in Treatment: 0 Constitutional respirations regular, non-labored and within target range for patient.Marland Kitchen Psychiatric pleasant and cooperative. Notes T the right groin there is an open wound with granulation tissue throughout. 5 sutures in place but not keeping anything closed. These are just attached to the o skin. No signs of surrounding infection including increased warmth, erythema or purulent drainage. Electronic Signature(s) Signed: 02/13/2023 4:26:53 PM By: [name] DO Entered By: [name] on 02/13/2023 11:05:58 -------------------------------------------------------------------------------- Physician Orders Details Patient Name: Date of Service: [name]. 02/13/2023 9:30 A M Medical Record Number: [id number] Patient Account Number: [id number] Date of Birth/Sex: Treating RN: 02/26/1956 (67 y.o. Tammy Sours Primary Care Provider: Jethro Bastos Other Clinician: Referring Provider: Treating Provider/Extender: Vista Lawman in Treatment: 0 Verbal / Phone Orders: No Diagnosis Coding ICD-10 Coding Code Description [phone] Non-pressure chronic ulcer of skin of other sites with fat layer exposed S31.501A Unspecified open wound of unspecified external genital organs, male, initial  encounter G82.20 Paraplegia, unspecified N49.3 Fournier gangrene Follow-up Appointments Return appointment in 3 weeks. - Dr. Mikey Bussing ****HOYER ***** extra time 60 minutes Monday 0800 03/05/2023 room 8 ppointment in: - x2 weeks follow up Return A Dr. Mikey Bussing ****HOYER ***** extra time 60 minutes Monday 0800 03/19/2023 room 8 Bathing/ Shower/ Hygiene May shower with protection but do not get wound dressing(s) wet. Protect dressing(s) with water repellant cover (for example, large plastic bag) or a  cast cover and may then take shower. Additional Orders / Instructions Other: - keep groin area clean and dry. Home Health New wound care orders this week; continue Home Health for wound care. May utilize formulary equivalent dressing for wound treatment orders unless otherwise specified. - vashe wet to dry- daily dressing changes. Other Home Health Orders/Instructions: - [facility]. Wound Treatment Wound #4 - Groin Wound Laterality: Right Cleanser: Wound Cleanser (Home Health) 1 x Per Day/30 Days Discharge Instructions: Cleanse the wound with wound cleanser prior to applying a clean dressing using gauze sponges, not tissue or cotton balls. Prim Dressing: Vashe wet to dry (Home Health) 1 x Per Day/30 Days ary Discharge Instructions: apply directly to wound bed. lightly pack. Secondary Dressing: ABD Pad, 5x9 (Home Health) 1 x Per Day/30 Days Discharge Instructions: Apply over primary dressing as directed. JAIR, LINDBLAD ([id number]) [id number] Page 3 of 7 Electronic Signature(s) Signed: 02/13/2023 4:26:53 PM By: [name] DO Entered By: [name] on 02/13/2023 11:06:04 -------------------------------------------------------------------------------- Problem List Details Patient Name: Date of Service: [name]. 02/13/2023 9:30 A M Medical Record Number: [id number] Patient Account Number: [id number] Date of Birth/Sex: Treating  RN: September 19, 1955 (67 y.o. M) Primary Care Provider: Jethro Bastos Other Clinician: Referring Provider: Treating Provider/Extender: Vista Lawman in Treatment: 0 Active Problems ICD-10 Encounter Code Description Active Date MDM Diagnosis [phone] Non-pressure chronic ulcer of skin of other sites with fat layer exposed 02/13/2023 No Yes S31.501A Unspecified open wound of unspecified external genital organs, male, initial 02/13/2023 No Yes encounter N49.3 Fournier gangrene 02/13/2023 No Yes G82.20 Paraplegia, unspecified 02/13/2023 No Yes Inactive Problems Resolved Problems Electronic Signature(s) Signed: 02/13/2023 4:26:53 PM By: [name] DO Entered By: [name] on 02/13/2023 10:58:52 -------------------------------------------------------------------------------- Progress Note Details Patient Name: Date of Service: Hattie Perch W. 02/13/2023 9:30 A M Medical Record Number: [id number] Patient Account Number: [id number] Date of Birth/Sex: Treating RN: Mar 01, 1956 (67 y.o. M) Primary Care Provider: Jethro Bastos Other Clinician: Referring Provider: Treating Provider/Extender: Vista Lawman in Treatment: 0 Subjective Chief Complaint ELIZANDRO, [name] ([id number]) [id number] Page 4 of 7 Information obtained from Patient 02/13/2023; right groin wound History of Present Illness (HPI) 02/13/2023 Mr. Renley Banwart III is a 67 year old male with a past medical history of paraplegia and multiple myeloma that presents to the clinic for a 1-62-month history of wound to his right groin. Patient developed an abscess and required incision and drainage of the scrotum on 6/3 and 6/7 by Dr. Angelica Chessman, urology. He was diagnosed with foreigners gangrene. He completed a course of doxycycline and Augmentin. He followed with infectious disease with no further current recommendations. He currently denies  signs of infection. He is using normal saline wet-to-dry dressings. He has no issues or complaints. He still has 5 sutures in place that are not keeping the skin together. Patient History Allergies ferumoxytol Family History Cancer - Mother, Diabetes - Father. Social History Never smoker, Marital Status - Married, Alcohol Use - Never, Drug Use - No History, Caffeine Use - Never. Medical History Eyes Patient has history of Glaucoma Hematologic/Lymphatic Patient has history of Anemia Respiratory Patient has history of Sleep Apnea Cardiovascular Patient has history of Deep Vein Thrombosis - xarelto Endocrine Patient has history of Type II Diabetes Neurologic Patient has history of Paraplegia Hospitalization/Surgery History - 12/29/2022 scrotal exploration. - 12/25/2022 IandD abscess. - 06/2011 colostomy. - 03/2013 suprapubic catheter. Medical A Surgical History Notes nd Constitutional  Symptoms (General Health) adrenal insufficiency Gastrointestinal neurogenic bowel, SBO Genitourinary neurogenic bladder Oncologic Multiple myeloma Objective Constitutional respirations regular, non-labored and within target range for patient.. Vitals Time Taken: 9:43 AM, Height: 68 in, Source: Stated, Weight: 230 lbs, Source: Stated, BMI: 35, Temperature: 98.3 F, Pulse: 75 bpm, Respiratory Rate: 20 breaths/min, Blood Pressure: 110/73 mmHg. Psychiatric pleasant and cooperative. General Notes: T the right groin there is an open wound with granulation tissue throughout. 5 sutures in place but not keeping anything closed. These are just o attached to the skin. No signs of surrounding infection including increased warmth, erythema or purulent drainage. Integumentary (Hair, Skin) Wound #4 status is Open. Original cause of wound was Bump. The date acquired was: 12/25/2022. The wound is located on the Right Groin. The wound measures 6.5cm length x 2cm width x 3cm depth; 10.21cm^2 area and 30.631cm^3 volume.  There is Fat Layer (Subcutaneous Tissue) exposed. There is no tunneling or undermining noted. There is a medium amount of serosanguineous drainage noted. The wound margin is epibole. There is large (67-100%) red granulation within the wound bed. There is no necrotic tissue within the wound bed. The periwound skin appearance did not exhibit: Callus, Crepitus, Excoriation, Induration, Rash, Scarring, Dry/Scaly, Maceration, Atrophie Blanche, Cyanosis, Ecchymosis, Hemosiderin Staining, Mottled, Pallor, Rubor, Erythema. General Notes: x4 sutures noted. Assessment JAYLENE, SCHROM ([id number]) [id number] Page 5 of 7 Active Problems ICD-10 Non-pressure chronic ulcer of skin of other sites with fat layer exposed Unspecified open wound of unspecified external genital organs, male, initial encounter Fournier gangrene Paraplegia, unspecified Patient presents with a 1-82-month history of open wound to his groin secondary to foreign years gangrene status post incision and drainage and debridement of the area by urology, Dr. Berneice Heinrich. No signs of infection on exam. He has completed the appropriate antibiotic course. The 5 sutures that were not connecting the skin together were removed. At this time I recommended Vashe wet-to-dry dressings. He states that [facility] changes the dressing daily for him. He can only follow-up on Monday mornings. Our next available is in 3 weeks. He knows to call with any questions or concerns. Plan Follow-up Appointments: Return appointment in 3 weeks. - Dr. Mikey Bussing ****HOYER ***** extra time 60 minutes Monday 0800 03/05/2023 room 8 Return Appointment in: - x2 weeks follow up Dr. Mikey Bussing ****HOYER ***** extra time 60 minutes Monday 0800 03/19/2023 room 8 Bathing/ Shower/ Hygiene: May shower with protection but do not get wound dressing(s) wet. Protect dressing(s) with water repellant cover (for example, large plastic bag) or a cast cover and may  then take shower. Additional Orders / Instructions: Other: - keep groin area clean and dry. Home Health: New wound care orders this week; continue Home Health for wound care. May utilize formulary equivalent dressing for wound treatment orders unless otherwise specified. - vashe wet to dry- daily dressing changes. Other Home Health Orders/Instructions: - [facility]. WOUND #4: - Groin Wound Laterality: Right Cleanser: Wound Cleanser (Home Health) 1 x Per Day/30 Days Discharge Instructions: Cleanse the wound with wound cleanser prior to applying a clean dressing using gauze sponges, not tissue or cotton balls. Prim Dressing: Vashe wet to dry (Home Health) 1 x Per Day/30 Days ary Discharge Instructions: apply directly to wound bed. lightly pack. Secondary Dressing: ABD Pad, 5x9 (Home Health) 1 x Per Day/30 Days Discharge Instructions: Apply over primary dressing as directed. 1. Vashe wet-to-dry dressings 2. Follow-up in 3 weeks Electronic Signature(s) Signed: 02/13/2023 4:26:53 PM By: [name] DO  Entered By: [name] on 02/13/2023 11:08:34 -------------------------------------------------------------------------------- HxROS Details Patient Name: Date of Service: [place] 02/13/2023 9:30 A M Medical Record Number: [id number] Patient Account Number: [id number] Date of Birth/Sex: Treating RN: [birth year] (67 y.o. Tammy Sours Primary Care Provider: Jethro Bastos Other Clinician: Referring Provider: Treating Provider/Extender: Vista Lawman in Treatment: 0 Constitutional Symptoms (General Health) Medical History: Past Medical History Notes: adrenal insufficiency Eyes Medical History: Positive for: Glaucoma Hematologic/Lymphatic Medical History: Positive for: Anemia YVON, MCCORD ([id number]) [id number] Page 6 of 7 Respiratory Medical History: Positive for: Sleep  Apnea Cardiovascular Medical History: Positive for: Deep Vein Thrombosis - xarelto Gastrointestinal Medical History: Past Medical History Notes: neurogenic bowel, SBO Endocrine Medical History: Positive for: Type II Diabetes Genitourinary Medical History: Past Medical History Notes: neurogenic bladder Neurologic Medical History: Positive for: Paraplegia Oncologic Medical History: Past Medical History Notes: Multiple myeloma HBO Extended History Items Eyes: Glaucoma Immunizations Pneumococcal Vaccine: Received Pneumococcal Vaccination: No Implantable Devices No devices added Hospitalization / Surgery History Type of Hospitalization/Surgery 12/29/2022 scrotal exploration 12/25/2022 IandD abscess 06/2011 colostomy 03/2013 suprapubic catheter Family and Social History Cancer: Yes - Mother; Diabetes: Yes - Father; Never smoker; Marital Status - Married; Alcohol Use: Never; Drug Use: No History; Caffeine Use: Never; Financial Concerns: No; Food, Clothing or Shelter Needs: No; Support System Lacking: No; Transportation Concerns: No Electronic Signature(s) Signed: 02/13/2023 4:26:53 PM By: [name] DO Signed: 02/13/2023 4:56:09 PM By: Shawn Stall RN, BSN Entered By: Shawn Stall on 02/13/2023 07:25:32 [name] ([id number]) [id number] Page 7 of 7 -------------------------------------------------------------------------------- SuperBill Details Patient Name: Date of Service: [name] 02/13/2023 Medical Record Number: [id number] Patient Account Number: [id number] Date of Birth/Sex: Treating RN: 03-16-1956 (67 y.o. Tammy Sours Primary Care Provider: Jethro Bastos Other Clinician: Referring Provider: Treating Provider/Extender: Vista Lawman in Treatment: 0 Diagnosis Coding ICD-10 Codes Code Description [phone] Non-pressure chronic ulcer of skin of other sites with fat layer  exposed S31.501A Unspecified open wound of unspecified external genital organs, male, initial encounter G82.20 Paraplegia, unspecified N49.3 Fournier gangrene Facility Procedures : CPT4 Code: 10932355 Description: 99214 - WOUND CARE VISIT-LEV 4 EST PT Modifier: Quantity: 1 Physician Procedures : CPT4 Code Description Modifier [id number] - WC PHYS LEVEL 4 - NEW PT ICD-10 Diagnosis Description L98.492 Non-pressure chronic ulcer of skin of other sites with fat layer exposed S31.501A Unspecified open wound of unspecified external genital  organs, male, initial encounter G82.20 Paraplegia, unspecified N49.3 Fournier gangrene Quantity: 1 Electronic Signature(s) Signed: 02/13/2023 4:26:53 PM By: [name] DO Entered By: [name] on 02/13/2023 11:08:45

## 2023-02-13 NOTE — Progress Notes (Signed)
[name], [name] ([id number]) [id number] Page 1 of 4 Visit Report for 02/13/2023 Abuse Risk Screen Details Patient Name: Date of Service: [name]. 02/13/2023 9:30 A M Medical Record Number: [id number] Patient Account Number: [id number] Date of Birth/Sex: Treating RN: 09-02-1955 (67 y.o. Tammy Sours Primary Care Marcas Bowsher: Jethro Bastos Other Clinician: Referring Lark Langenfeld: Treating Frimy Uffelman/Extender: Vista Lawman in Treatment: 0 Abuse Risk Screen Items Answer ABUSE RISK SCREEN: Has anyone close to you tried to hurt or harm you recentlyo No Do you feel uncomfortable with anyone in your familyo No Has anyone forced you do things that you didnt want to doo No Electronic Signature(s) Signed: 02/13/2023 4:56:09 PM By: Shawn Stall RN, BSN Entered By: Shawn Stall on 02/13/2023 09:45:40 -------------------------------------------------------------------------------- Activities of Daily Living Details Patient Name: Date of Service: [name]. 02/13/2023 9:30 A M Medical Record Number: [id number] Patient Account Number: [id number] Date of Birth/Sex: Treating RN: [birth year] (67 y.o. Tammy Sours Primary Care Abrahan Fulmore: Jethro Bastos Other Clinician: Referring Tevon Berhane: Treating Jailin Manocchio/Extender: Vista Lawman in Treatment: 0 Activities of Daily Living Items Answer Activities of Daily Living (Please select one for each item) Drive Automobile Not Able T Medications ake Completely Able Use T elephone Completely Able Care for Appearance Need Assistance Use T oilet Not Able Bath / Shower Need Assistance Dress Self Need Assistance Feed Self Completely Able Walk Not Able Get In / Out Bed Not Able Housework Need Assistance Prepare Meals Need Assistance Handle Money Completely Able Shop for Self Need Assistance Electronic Signature(s) Signed: 02/13/2023 4:56:09  PM By: Shawn Stall RN, BSN Entered By: Shawn Stall on 02/13/2023 09:46:45 [name] ([id number]) [id number] Page 2 of 4 -------------------------------------------------------------------------------- Education Screening Details Patient Name: Date of Service: [name]. 02/13/2023 9:30 A M Medical Record Number: [id number] Patient Account Number: [id number] Date of Birth/Sex: Treating RN: [birth year] (67 y.o. [name], [name] Primary Care Starkisha Tullis: Jethro Bastos Other Clinician: Referring Jaymes Hang: Treating Shaya Altamura/Extender: Vista Lawman in Treatment: 0 Primary Learner Assessed: Patient Learning Preferences/Education Level/Primary Language Learning Preference: Explanation, Demonstration, Printed Material Highest Education Level: High School Preferred Language: English Cognitive Barrier Language Barrier: No Translator Needed: No Memory Deficit: No Emotional Barrier: No Cultural/Religious Beliefs Affecting Medical Care: No Physical Barrier Impaired Vision: No Impaired Hearing: No Decreased Hand dexterity: No Knowledge/Comprehension Knowledge Level: High Comprehension Level: High Ability to understand written instructions: High Ability to understand verbal instructions: High Motivation Anxiety Level: Calm Cooperation: Cooperative Education Importance: Acknowledges Need Interest in Health Problems: Asks Questions Perception: Coherent Willingness to Engage in Self-Management High Activities: Readiness to Engage in Self-Management High Activities: Electronic Signature(s) Signed: 02/13/2023 4:56:09 PM By: Shawn Stall RN, BSN Entered By: Shawn Stall on 02/13/2023 09:47:05 -------------------------------------------------------------------------------- Fall Risk Assessment Details Patient Name: Date of Service: [name]. 02/13/2023 9:30 A M Medical Record Number:  [id number] Patient Account Number: [id number] Date of Birth/Sex: Treating RN: 04/30/1956 (67 y.o. Tammy Sours Primary Care Chloris Marcoux: Jethro Bastos Other Clinician: Referring Dollie Mayse: Treating Hikari Tripp/Extender: Vista Lawman in Treatment: 0 Fall Risk Assessment Items Have you had 2 or more falls in the last [address] monthso 0 No [name], [name] ([id number]) [phone] [id number] Page 3 of 4 Have you had any fall that resulted in injury in the last 12 monthso 0 No FALLS RISK SCREEN History of falling - immediate or  within 3 months 0 No Secondary diagnosis (Do you have 2 or more medical diagnoseso) 0 No Ambulatory aid None/bed rest/wheelchair/nurse 0 Yes Crutches/cane/walker 0 No Furniture 0 No Intravenous therapy Access/Saline/Heparin Lock 0 No Gait/Transferring Normal/ bed rest/ wheelchair 0 Yes Weak (short steps with or without shuffle, stooped but able to lift head while walking, may seek 0 No support from furniture) Impaired (short steps with shuffle, may have difficulty arising from chair, head down, impaired 0 No balance) Mental Status Oriented to own ability 0 Yes Electronic Signature(s) Signed: 02/13/2023 4:56:09 PM By: Shawn Stall RN, BSN Entered By: Shawn Stall on 02/13/2023 09:47:15 -------------------------------------------------------------------------------- Foot Assessment Details Patient Name: Date of Service: [name]. 02/13/2023 9:30 A M Medical Record Number: [id number] Patient Account Number: [id number] Date of Birth/Sex: Treating RN: 1956-03-15 (67 y.o. Tammy Sours Primary Care Alyssa Rotondo: Jethro Bastos Other Clinician: Referring Lassie Demorest: Treating Bryleigh Ottaway/Extender: Vista Lawman in Treatment: 0 Foot Assessment Items Site Locations + = Sensation present, - = Sensation absent, C = Callus, U = Ulcer R = Redness, W = Warmth, M = Maceration, PU =  Pre-ulcerative lesion F = Fissure, S = Swelling, D = Dryness Assessment Right: Left: Other Deformity: No No Prior Foot Ulcer: No No Prior Amputation: No No Charcot Joint: No No Ambulatory Status: Non-ambulatory Assistance Device: Wheelchair [name], [name] ([id number]) [phone] [id number] Page 4 of 4 Gait: Notes NO BLE wounds. Electronic Signature(s) Signed: 02/13/2023 4:56:09 PM By: Shawn Stall RN, BSN Entered By: Shawn Stall on 02/13/2023 09:47:57 -------------------------------------------------------------------------------- Nutrition Risk Screening Details Patient Name: Date of Service: Gary Berthold S W. 02/13/2023 9:30 A M Medical Record Number: [id number] Patient Account Number: [id number] Date of Birth/Sex: Treating RN: [birth year] (67 y.o. [name], [name] Primary Care Todd Jelinski: Jethro Bastos Other Clinician: Referring Brin Ruggerio: Treating Cambrey Lupi/Extender: Vista Lawman in Treatment: 0 Height (in): 68 Weight (lbs): 230 Body Mass Index (BMI): 35 Nutrition Risk Screening Items Score Screening NUTRITION RISK SCREEN: I have an illness or condition that made me change the kind and/or amount of food I eat 2 Yes I eat fewer than two meals per day 0 No I eat few fruits and vegetables, or milk products 0 No I have three or more drinks of beer, liquor or wine almost every day 0 No I have tooth or mouth problems that make it hard for me to eat 0 No I don't always have enough money to buy the food I need 0 No I eat alone most of the time 0 No I take three or more different prescribed or over-the-counter drugs a day 1 Yes Without wanting to, I have lost or gained 10 pounds in the last six months 0 No I am not always physically able to shop, cook and/or feed myself 2 Yes Nutrition Protocols Good Risk Protocol Moderate Risk Protocol 0 Provide education on nutrition High Risk Proctocol Risk Level: Moderate  Risk Score: 5 Electronic Signature(s) Signed: 02/13/2023 4:56:09 PM By: Shawn Stall RN, BSN Entered By: Shawn Stall on 02/13/2023 09:47:29

## 2023-02-16 ENCOUNTER — Ambulatory Visit: Payer: Medicare (Managed Care) | Admitting: Hematology and Oncology

## 2023-02-16 ENCOUNTER — Inpatient Hospital Stay (HOSPITAL_BASED_OUTPATIENT_CLINIC_OR_DEPARTMENT_OTHER): Payer: Medicare (Managed Care) | Admitting: Physician Assistant

## 2023-02-16 ENCOUNTER — Encounter: Payer: Self-pay | Admitting: Hematology and Oncology

## 2023-02-16 ENCOUNTER — Other Ambulatory Visit: Payer: Self-pay | Admitting: Physician Assistant

## 2023-02-16 ENCOUNTER — Other Ambulatory Visit: Payer: Medicare (Managed Care)

## 2023-02-16 ENCOUNTER — Telehealth: Payer: Self-pay

## 2023-02-16 ENCOUNTER — Inpatient Hospital Stay: Payer: Medicare (Managed Care)

## 2023-02-16 ENCOUNTER — Ambulatory Visit: Payer: Medicare (Managed Care)

## 2023-02-16 ENCOUNTER — Other Ambulatory Visit: Payer: Self-pay

## 2023-02-16 DIAGNOSIS — C9002 Multiple myeloma in relapse: Secondary | ICD-10-CM | POA: Diagnosis not present

## 2023-02-16 DIAGNOSIS — Z5111 Encounter for antineoplastic chemotherapy: Secondary | ICD-10-CM | POA: Diagnosis not present

## 2023-02-16 LAB — CMP (CANCER CENTER ONLY)
ALT: 18 U/L (ref 0–44)
AST: 14 U/L — ABNORMAL LOW (ref 15–41)
Albumin: 3.3 g/dL — ABNORMAL LOW (ref 3.5–5.0)
Alkaline Phosphatase: 90 U/L (ref 38–126)
Anion gap: 5 (ref 5–15)
BUN: 7 mg/dL — ABNORMAL LOW (ref 8–23)
CO2: 27 mmol/L (ref 22–32)
Calcium: 9 mg/dL (ref 8.9–10.3)
Chloride: 110 mmol/L (ref 98–111)
Creatinine: 0.63 mg/dL (ref 0.61–1.24)
GFR, Estimated: >60 mL/min (ref >60)
Glucose, Bld: 164 mg/dL — ABNORMAL HIGH (ref 70–99)
Potassium: 3.6 mmol/L (ref 3.5–5.1)
Sodium: 142 mmol/L (ref 135–145)
Total Bilirubin: 0.4 mg/dL (ref 0.3–1.2)
Total Protein: 6 g/dL — ABNORMAL LOW (ref 6.5–8.1)

## 2023-02-16 LAB — CBC WITH DIFFERENTIAL (CANCER CENTER ONLY)
Abs Immature Granulocytes: 0.03 10*3/uL (ref 0.00–0.07)
Basophils Absolute: 0 10*3/uL (ref 0.0–0.1)
Basophils Relative: 1 %
Eosinophils Absolute: 0.3 10*3/uL (ref 0.0–0.5)
Eosinophils Relative: 4 %
HCT: 30.4 % — ABNORMAL LOW (ref 39.0–52.0)
Hemoglobin: 10.2 g/dL — ABNORMAL LOW (ref 13.0–17.0)
Immature Granulocytes: 0 %
Lymphocytes Relative: 11 %
Lymphs Abs: 0.9 10*3/uL (ref 0.7–4.0)
MCH: 25.5 pg — ABNORMAL LOW (ref 26.0–34.0)
MCHC: 33.6 g/dL (ref 30.0–36.0)
MCV: 76 fL — ABNORMAL LOW (ref 80.0–100.0)
Monocytes Absolute: 0.5 10*3/uL (ref 0.1–1.0)
Monocytes Relative: 6 %
Neutro Abs: 5.9 10*3/uL (ref 1.7–7.7)
Neutrophils Relative %: 78 %
Platelet Count: 430 10*3/uL — ABNORMAL HIGH (ref 150–400)
RBC: 4 MIL/uL — ABNORMAL LOW (ref 4.22–5.81)
RDW: 19.9 % — ABNORMAL HIGH (ref 11.5–15.5)
WBC Count: 7.6 10*3/uL (ref 4.0–10.5)
nRBC: 0 % (ref 0.0–0.2)

## 2023-02-16 MED ORDER — SODIUM CHLORIDE 0.9 % IV SOLN: Freq: Once | INTRAVENOUS | Status: AC

## 2023-02-16 MED ORDER — DEXTROSE 5 % IV SOLN
70.0000 mg/m2 | Freq: Once | INTRAVENOUS | Status: AC
  Administered 2023-02-16: 150 mg via INTRAVENOUS
  Filled 2023-02-16: qty 60

## 2023-02-16 MED ORDER — SODIUM CHLORIDE 0.9 % IV SOLN
40.0000 mg | Freq: Once | INTRAVENOUS | Status: AC
  Administered 2023-02-16: 40 mg via INTRAVENOUS
  Filled 2023-02-16: qty 4

## 2023-02-16 NOTE — Progress Notes (Unsigned)
[facility] [phone]   [phone]  PROGRESS NOTE  Patient Care Team: Jethro Bastos, MD as PCP - General (Family Medicine) Ranelle Oyster, MD as Consulting Physician (Physical Medicine and Rehabilitation) Hildred Alamin Mcarthur Rossetti, PA-C as Physician Assistant (Physical Medicine and Rehabilitation) Jaci Standard, MD as Consulting Physician (Hematology and Oncology) Jaci Standard, MD as Consulting Physician (Hematology and Oncology) Jaci Standard, MD as Consulting Physician (Hematology and Oncology)  Hematological/Oncological History # IgG Lambda Multiple Myeloma, Relapsed (ISS Stage II) 1) 06/2010: initial diagnosis of Multiple Myeloma after T8 compression fracture. Treated with Velcade/Revlimid/Dexamethasone and achieved a complete remission 2) Velcade was discontinued in September 2012 and that Revlimid and Decadron were discontinued in March 2013. 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 4) Followed by Dr. Bertis Ruddy, last clinic visit 10/09/2019. At that time there was concern for relapse of his multiple myeloma.  5) Patient requested transfer to different provider after misunderstanding regarding imaging studies 6) 12/17/2019: transfer care to Dr. Leonides Schanz  7) 01/09/2020: Cycle 1 Day 1 of Dara/Velcade/Dex 8) 01/21/2020: presented as urgent visit for diarrhea and dehydration. Holding chemotherapy scheduled for 01/23/2020. 9) 01/30/2020: Resume dara/velcade/dex after resolution of diarrhea.  10) 02/13/2020: restaging labs show M protein 0.8, Kappa 4.5, lamba 17.2, ratio 0.26, urine M protein 53 (7.1%). All MM labs indicate improvement.  11) 03/10/2020: Cycle 4 Day 1 of Dara/Velcade/Dex. Transition to q 3 week daratumumab.  12) 06/04/2020:  Cycle 8 Day 1 of Dara/Velcade/Dex 13) 06/25/2020:  Cycle 9 Day 1 of Dara/Velcade/Dex 14) 07/22/2020: Cycle 10 Day 1  of  Dara//Dex 15) 08/18/2020: Cycle 11 Day 1 of Dara//Dex 16) 09/23/2020: Cycle 12 Day 1 of Dara//Dex 17) 10/22/2020: Cycle 13 Day 1 of Dara/Dex  18) 11/19/2020: Cycle 14 Day 1 of Dara/Dex  19) 12/17/2020: Cycle 15 Day 1 of Dara/Dex  20) 01/17/2021: Cycle 16 Day 1 of Dara/Dex  21) 02/11/2021: Cycle 17 Day 1 of Dara/Dex 22) 03/10/2021: Cycle 18 Day 1 of Dara/Dex  23) 04/08/2021: Cycle 19 Day 1 of Dara/Dex  24) 08/03/2021: Cycle 20 Day 1 of Dara/Dex (delayed due to scheduling error) 25) 09/02/2021: Cycle 21 Day 1 of Dara/Dex 26) 09/30/2021: Cycle 22 Day 1 of Dara/Dex 27) 10/28/2021: Cycle 23 Day 1 of Dara/Dex 28) 12/02/2021: Cycle 24 Day 1 of Dara/Dex 29) 12/30/2021: Cycle 25 Day 1 of Dara/Dex 30) 01/06/2022: Cycle 1 Day 1 of Kyprolis/Dex 31) 02/03/2022: Cycle 2 Day 1 of Kyprolis/Dex 32) 02/24/2022: Cycle 3 Day 1 of Kyprolis/Dex 33) 04/07/2022: Cycle 4 Day 1 of Kyprolis/Dex 34) 05/05/2022: Cycle 5 Day 1 of Kyprolis/Dex 35) 06/01/2022: Cycle 6 Day 1 of Kyprolis/Dex 36) 06/29/2022: Cycle 7 Day 1 of Kyprolis/Dex 37) 07/28/2022: Cycle 8 Day 1 of Kyprolis/Dex 38) 08/25/2022: Cycle 9 Day 1 of Kyprolis/Dex 39) 09/22/2022: Cycle 10 Day 1 of Kyprolis/Dex 40) 10/20/2022: Cycle 11 Day 1 of Kyprolis/Dex 41) 11/17/2022: Cycle 12 Day 1 of Kyprolis/Dex 42 ) 12/22/2022-01/08/2023: Admitted for fournier's gangrene of scrotum.  43) 01/26/2023: Cycle 13 Day 1 of Kyprolis/Dex 44) 02/16/2023: Cycle 14 Day 1 of Kyprolis/Dex Interval History:  [name] 67 y.o. male with medical history significant for  IgG Lambda Multiple Myeloma who presents for a follow up visit. The patient's last visit was on 01/26/2023.  He presents today prior to treatment with Kyprolis/Dex. He is unaccompanied for this visit.   On exam today  [name] reports he is continuing to improve with his energy levels. He is continuing with wound care to pack his wound as it is healing. He would like clarification with the proper wound care material to his [facility]  team. He is tolerating treatment without any new symptoms.  He denies nausea, vomiting or abdominal pain. He has good output from his colostomy and good urinary output. He denies easy bruising or any signs of bleeding.He denies fevers, chills, night sweats, shortness of breath, chest pain or abdominal pain.  He has no other complaints.  Rest of the 10 point ROS is below.   MEDICAL HISTORY:  Past Medical History:  Diagnosis Date   Adrenal insufficiency (HCC)    on chronic dexamethasone   Anemia    Cancer (HCC)    Coagulopathy (HCC)    on xeralto/ s/p DVT while on coumadin,  IVC in place   Diabetes mellitus without complication (HCC)    type 2   Glaucoma 12/22/2022   Gross hematuria 01/2013   post foley cath procedure   History of blood transfusion 01/2013   Lower GI bleeding 04/18/2017   Multiple myeloma    thoracic T8 with paraplegia s/p resection- on chemo at visit 10/13/10   Multiple myeloma    Multiple myeloma without mention of remission    Neurogenic bladder    Neurogenic bowel    OSA (obstructive sleep apnea) 11/01/2022   Paraplegia (HCC)    Partial small bowel obstruction (HCC) during dec 2011 admission    SURGICAL HISTORY: Past Surgical History:  Procedure Laterality Date   COLONOSCOPY WITH PROPOFOL N/A 04/12/2017   Procedure: COLONOSCOPY WITH PROPOFOL;  Surgeon: Hilarie Fredrickson, MD;  Location: [facility];  Service: Endoscopy;  Laterality: N/A;   COLONOSCOPY WITH PROPOFOL N/A 04/19/2017   Procedure: COLONOSCOPY WITH PROPOFOL;  Surgeon: Benancio Deeds, MD;  Location: [facility];  Service: Gastroenterology;  Laterality: N/A;   COLOSTOMY  07/20/2011   Procedure: COLOSTOMY;  Surgeon: Rulon Abide, DO;  Location[facility] OR;  Service: General;;   COLOSTOMY REVISION  07/20/2011   Procedure: COLON RESECTION SIGMOID;  Surgeon: Rulon Abide, DO;  Location: [facility] OR;  Service: General;;   CYSTOSCOPY N/A 04/04/2013   Procedure: CYSTOSCOPY WITH LITHALOPAXY;  Surgeon:  Sebastian Ache, MD;  Location: [facility];  Service: Urology;  Laterality: N/A;   INCISION AND DRAINAGE ABSCESS N/A 12/25/2022   Procedure: INCISION AND DRAINAGE OF SCROTUM;  Surgeon: Despina Arias, MD;  Location: [facility];  Service: Urology;  Laterality: N/A;   INSERTION OF SUPRAPUBIC CATHETER N/A 04/04/2013   Procedure: INSERTION OF SUPRAPUBIC CATHETER;  Surgeon: Sebastian Ache, MD;  Location: [facility];  Service: Urology;  Laterality: N/A;   LAPAROTOMY  07/20/2011   Procedure: EXPLORATORY LAPAROTOMY;  Surgeon: Rulon Abide, DO;  Location: [facility] OR;  Service: General;  Laterality: N/A;   myeloma thoracic T8 with parpaplegia s/p thoracotomy and thoracic T7-9 cage placement on Dec 26th 2011  07/18/10   SCROTAL EXPLORATION N/A 12/29/2022   Procedure: SCROTAL WOUND DEBRIDEMENT AND CLOSURE;  Surgeon: Loletta Parish., MD;  Location: [facility];  Service: Urology;  Laterality: N/A;    SOCIAL HISTORY: Social History   Socioeconomic History   Marital status: Married    Spouse name: Not on file   Number of children: Not on file   Years of education: Not on file   Highest education level: Not on file  Occupational History   Not on file  Tobacco Use   Smoking status: Never   Smokeless tobacco: Never  Vaping Use   Vaping status: Never Used  Substance and Sexual Activity   Alcohol use: No   Drug use: No   Sexual activity: Never  Other Topics Concern   Not on file  Social History Narrative   Not on file   Social Determinants of Health   Financial Resource Strain: Not on file  Food Insecurity: No Food Insecurity (12/22/2022)   Hunger Vital Sign    Worried About Running Out of Food in the Last Year: Never true    Ran Out of Food in the Last Year: Never true  Transportation Needs: No Transportation Needs (12/22/2022)   PRAPARE - Administrator, Civil Service (Medical): No    Lack of Transportation (Non-Medical): No  Physical Activity: Not on file  Stress: Not on file  Social  Connections: Not on file  Intimate Partner Violence: Not At Risk (12/22/2022)   Humiliation, Afraid, Rape, and Kick questionnaire    Fear of Current or Ex-Partner: No    Emotionally Abused: No    Physically Abused: No    Sexually Abused: No    FAMILY HISTORY: Family History  Problem Relation Age of Onset   Ovarian cancer Mother    Diabetes Father     ALLERGIES:  is allergic to ferumoxytol.  MEDICATIONS:  Current Outpatient Medications  Medication Sig Dispense Refill   acetaminophen (TYLENOL) 325 MG tablet Take 325-650 mg by mouth every 6 (six) hours as needed for headache or mild pain.     acetic acid 0.25 % irrigation Irrigate with 1 Application as directed See admin instructions. Instill 50 ml's, clamp for 10 minutes, then remove clamp and drain the bladder. Repeat 3 times a week- when not using the Renacidin irrigation     acyclovir (ZOVIRAX) 400 MG tablet Take 1 tablet (400 mg total) by mouth 2 (two) times daily. 60 tablet 2   albuterol (VENTOLIN HFA) 108 (90 Base) MCG/ACT inhaler Inhale 2 puffs into the lungs every 4 (four) hours as needed for wheezing or shortness of breath.     atorvastatin (LIPITOR) 20 MG tablet Take 20 mg by mouth daily.     BENADRYL ALLERGY 25 MG tablet Take 25 mg by mouth every 6 (six) hours as needed (for allergic reactions).     BIOFREEZE 4 % GEL Apply 1 application  topically every 4 (four) hours as needed (for joint pain- to intact areas of the skin only).     Citric Ac-Gluconolact-Mg Carb (RENACIDIN IR) Irrigate with 30 mLs as directed See admin instructions. Instill 30 ml's, clamp for 10 minutes, then remove clamp and drain the bladder. Repeat 3 times a week- when not using the acetic acid irrigation     COSOPT PF 2-0.5 % SOLN Place 1 drop into both eyes 2 (two) times daily.     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     Emollient (EUCERIN EX) Apply 1 application   topically See admin instructions. Apply to affected area 2 times a day     FLOVENT HFA 110 MCG/ACT inhaler 2 puffs See admin instructions. 2 sprays to colostomy stoma prior to each colostomy bag change     iron polysaccharides (NU-IRON) 150 MG capsule Take 1 capsule (150 mg total) by mouth daily. 30 capsule 0   latanoprost (XALATAN) 0.005 % ophthalmic solution  Place 1 drop into both eyes at bedtime.     Multiple Vitamin (MULTIVITAMIN WITH MINERALS) TABS tablet Take 1 tablet by mouth daily. 30 tablet 0   NON FORMULARY Apply 1 application  topically See admin instructions. Colo Plast paste- Apply as directed with each ostomy bag change     ondansetron (ZOFRAN) 4 MG tablet Take 1 tablet (4 mg) by mouth every 6 hours as needed for nausea. 20 tablet 0   rivaroxaban (XARELTO) 10 MG TABS tablet Take 1 tablet (10 mg total) by mouth daily with supper. (Patient taking differently: Take 10 mg by mouth daily.) 30 tablet 9   Skin Protectants, Misc. (MINERIN CREME) CREA Apply to affected areas 2 times daily as needed. 454 g 0   Zinc Oxide (BALMEX EX) Apply 1 application  topically See admin instructions. Apply topically daily to affected sites     No current facility-administered medications for this visit.   Facility-Administered Medications Ordered in Other Visits  Medication Dose Route Frequency Provider Last Rate Last Admin   sodium chloride flush (NS) 0.9 % injection 10 mL  10 mL Intravenous PRN Bertis Ruddy, Ni, MD        REVIEW OF SYSTEMS:   Constitutional: ( - ) fevers, ( - )  chills , ( - ) night sweats Eyes: ( - ) blurriness of vision, ( - ) double vision, ( - ) watery eyes Ears, nose, mouth, throat, and face: ( - ) mucositis, ( - ) sore throat Respiratory: ( - ) cough, ( - ) dyspnea, ( - ) wheezes Cardiovascular: ( - ) palpitation, ( - ) chest discomfort, ( - ) lower extremity swelling Gastrointestinal:  ( - ) nausea, ( - ) heartburn, ( - ) change in bowel habits Skin: ( - ) abnormal skin  rashes Lymphatics: ( - ) new lymphadenopathy, ( - ) easy bruising Neurological: ( - ) numbness, ( - ) tingling, ( - ) new weaknesses Behavioral/Psych: ( - ) mood change, ( - ) new changes  All other systems were reviewed with the patient and are negative.  PHYSICAL EXAMINATION: ECOG PERFORMANCE STATUS: paraplegic.   Vitals:   02/16/23 1102  BP: 124/82  Pulse: 95  Resp: 18  Temp: 98.3 F (36.8 C)  SpO2: 100%    Filed Weights   02/16/23 1102  Weight: 250 lb (113.4 kg)    GENERAL: well appearing middle aged [place] American male alert, no distress and comfortable SKIN: skin color, texture, turgor are normal, no rashes or significant lesions EYES: conjunctiva are pink and non-injected, sclera clear LUNGS: clear to auscultation and percussion with normal breathing effort HEART: regular rate & rhythm and no murmurs Musculoskeletal: no cyanosis of digits and no clubbing  PSYCH: alert & oriented x 3, fluent speech NEURO: paraplegic, no use of LE bilaterally.   LABORATORY DATA:  I have reviewed the data as listed    Latest Ref Rng & Units 02/16/2023   10:40 AM 02/02/2023    1:38 PM 01/26/2023   10:43 AM  CBC  WBC 4.0 - 10.5 K/uL 7.6  10.2  8.8   Hemoglobin 13.0 - 17.0 g/dL 40.9  81.1  91.4   Hematocrit 39.0 - 52.0 % 30.4  30.4  30.9   Platelets 150 - 400 K/uL 430  210  398        Latest Ref Rng & Units 02/02/2023    1:38 PM 01/26/2023   10:43 AM 12/31/2022    6:20 AM  CMP  Glucose 70 - 99 mg/dL 295  621  308   BUN 8 - 23 mg/dL 10  8  15    Creatinine 0.61 - 1.24 mg/dL 6.57  8.46  9.62   Sodium 135 - 145 mmol/L 145  142  143   Potassium 3.5 - 5.1 mmol/L 3.6  3.3  3.5   Chloride 98 - 111 mmol/L 113  111  110   CO2 22 - 32 mmol/L 27  24  26    Calcium 8.9 - 10.3 mg/dL 9.1  8.8  8.3   Total Protein 6.5 - 8.1 g/dL 6.2  6.5    Total Bilirubin 0.3 - 1.2 mg/dL 0.4  0.3    Alkaline Phos 38 - 126 U/L 81  83    AST 15 - 41 U/L 15  14    ALT 0 - 44 U/L 17  13      Lab Results   Component Value Date   MPROTEIN 0.3 (H) 01/26/2023   MPROTEIN 0.2 (H) 11/17/2022   MPROTEIN 0.2 (H) 10/20/2022   Lab Results  Component Value Date   KPAFRELGTCHN 37.4 (H) 01/26/2023   KPAFRELGTCHN 10.4 11/17/2022   KPAFRELGTCHN 12.4 10/20/2022   LAMBDASER 23.1 01/26/2023   LAMBDASER 7.9 11/17/2022   LAMBDASER 9.9 10/20/2022   KAPLAMBRATIO 1.62 01/26/2023   KAPLAMBRATIO 1.32 11/17/2022   KAPLAMBRATIO 1.25 10/20/2022    RADIOGRAPHIC STUDIES: No results found.  ASSESSMENT & PLAN VASU CURLES 67 y.o. male with medical history significant for  IgG Lambda Multiple Myeloma who presents for a follow up visit.  # IgG Lambda Multiple Myeloma, Relapsed (ISS Stage II) --findings are most consistent with relapsed multiple myeloma. Patient previously successfully treated with Velcade/Rev/Dex and Daratumumab/Velcade/Dex. On 07/02/2020 he transitioned to monthly daratumumab alone.  --due to to rise in M protein, switched to Kyprolis and Dexamethasone on 01/06/2022 Plan: --Labs show white blood cell count 7.6, hemoglobin 10.2, MCV 76.0, and platelets of 430, Creatinine and LFTs are normal.  --Most recent myeloma labs from 01/26/2023 showed M protein of 0.3. Kappa light chain elevated at 37.4, lambda light chain and ratio normal.  --Continue with weekly labs plus Kyprolis/Dex treatment as scheduled  #Sepsis 2/2 UTI and Fournier's gangrene of scrotum: --Hospitalized from 531/2024-01/08/2023. Underwent debridement of necrotic tissue. --Given PIP tazobactam and Zyvox initially --Urine culture reveals Proteus mirabilis which is resistant to ciprofloxacin and nitrofurantoin --Blood cultures negative --D/C on remainder doxycycline and Augmentin which he completed on 01/07/2023.  --Evaluated by urology on 7/2 and ID on 7/3 with continued wound healing and no further d/c or evidence of infection.   #History of DVT --He had placement of IVC filter, remains on Xarelto. --Due to poor mobility, and lack  of bleeding complications, recommend to continue on Xarelto indefinitely. --caution if Plt count were to drop <50  #Snoring/Risk for OSA: --Diagnosed with OSA and has CPAP machine.   # Supportive Care -- provided patient with an albuterol inhaler (for use with daratumumab) --acyclovir 400mg  BID for VZV prophylaxis --zofran 8mg  q8H PRN for nausea/vomiting  --Zometa is being held in the setting of his prior episode of osteonecrosis of the jaw  No orders of the defined types were placed in this encounter.   All questions were answered. The patient knows to call the clinic with any problems, questions or concerns.  I have spent a total of 30 minutes minutes of face-to-face and non-face-to-face time, preparing to see the patient,  performing a medically appropriate examination, counseling and educating the  patient, communicating with other health care professionals, documenting clinical information in the electronic health record,  and care coordination.   Georga Kaufmann PA-C Dept of Hematology and Oncology [facility] Phone: [phone]    02/16/2023 11:18 AM   Literature Support:  Milagros Loll, Petrucci [place], [place], [place], [place], [place], Spada S, [place], Ponticelli E, [place], Cavo M, Di Toritto TC, Haydee Salter F, [place] V, Palumbo A, Boccadoro M, Larocca A. Once-weekly versus twice-weekly carfilzomib in patients with newly diagnosed multiple myeloma: a pooled analysis of two phase I/II studies. Haematologica. 2019 Aug;104(8):1640-1647.  --Once-weekly 70 mg/m2 carfilzomib as induction and maintenance therapy for newly diagnosed multiple myeloma patients was as safe and effective as twice-weekly 36 mg/m2 carfilzomib and provided a more convenient schedule.

## 2023-02-16 NOTE — Telephone Encounter (Signed)
Dr. Neita Garnet number is [phone]  [name] needs to call [name] [phone] and give clear instructions on type of dressing for packing of pt's wound IT  LM at the number above with request and recommendations.

## 2023-02-16 NOTE — Patient Instructions (Signed)
[place] [facility]  Discharge Instructions: Thank you for choosing [facility] to provide your oncology and hematology care.   If you have a lab appointment with the Cancer Center, please go directly to the Cancer Center and check in at the registration area.   Wear comfortable clothing and clothing appropriate for easy access to any Portacath or PICC line.   We strive to give you quality time with your provider. You may need to reschedule your appointment if you arrive late (15 or more minutes).  Arriving late affects you and other patients whose appointments are after yours.  Also, if you miss three or more appointments without notifying the office, you may be dismissed from the clinic at the provider's discretion.      For prescription refill requests, have your pharmacy contact our office and allow 72 hours for refills to be completed.    Today you received the following chemotherapy and/or immunotherapy agents: Kyrolis.       To help prevent nausea and vomiting after your treatment, we encourage you to take your nausea medication as directed.  BELOW ARE SYMPTOMS THAT SHOULD BE REPORTED IMMEDIATELY: *FEVER GREATER THAN 100.4 F (38 C) OR HIGHER *CHILLS OR SWEATING *NAUSEA AND VOMITING THAT IS NOT CONTROLLED WITH YOUR NAUSEA MEDICATION *UNUSUAL SHORTNESS OF BREATH *UNUSUAL BRUISING OR BLEEDING *URINARY PROBLEMS (pain or burning when urinating, or frequent urination) *BOWEL PROBLEMS (unusual diarrhea, constipation, pain near the anus) TENDERNESS IN MOUTH AND THROAT WITH OR WITHOUT PRESENCE OF ULCERS (sore throat, sores in mouth, or a toothache) UNUSUAL RASH, SWELLING OR PAIN  UNUSUAL VAGINAL DISCHARGE OR ITCHING   Items with * indicate a potential emergency and should be followed up as soon as possible or go to the Emergency Department if any problems should occur.  Please show the CHEMOTHERAPY ALERT CARD or IMMUNOTHERAPY ALERT CARD at  check-in to the Emergency Department and triage nurse.  Should you have questions after your visit or need to cancel or reschedule your appointment, please contact [place] [facility]  Dept: [phone]  and follow the prompts.  Office hours are 8:00 a.m. to 4:30 p.m. Monday - Friday. Please note that voicemails left after 4:00 p.m. may not be returned until the following business day.  We are closed weekends and major holidays. You have access to a nurse at all times for urgent questions. Please call the main number to the clinic Dept: [phone] and follow the prompts.   For any non-urgent questions, you may also contact your provider using MyChart. We now offer e-Visits for anyone 34 and older to request care online for non-urgent symptoms. For details visit [email or web address].   Also download the MyChart app! Go to the app store, search "MyChart", open the app, select [facility], and log in with your MyChart username and password.

## 2023-02-18 ENCOUNTER — Encounter: Payer: Self-pay | Admitting: Hematology and Oncology

## 2023-02-19 ENCOUNTER — Other Ambulatory Visit: Payer: Self-pay | Admitting: Physician Assistant

## 2023-02-19 DIAGNOSIS — D509 Iron deficiency anemia, unspecified: Secondary | ICD-10-CM

## 2023-02-21 NOTE — Telephone Encounter (Signed)
T/C from [facility] in Wound Care and said he would call [facility] with recommendations

## 2023-02-23 ENCOUNTER — Inpatient Hospital Stay: Payer: Medicare (Managed Care)

## 2023-02-23 ENCOUNTER — Other Ambulatory Visit: Payer: Self-pay

## 2023-02-23 ENCOUNTER — Inpatient Hospital Stay: Payer: Medicare (Managed Care) | Attending: Physician Assistant

## 2023-02-23 VITALS — BP 124/72 | HR 88 | Temp 98.2°F | Resp 18 | Wt 249.4 lb

## 2023-02-23 DIAGNOSIS — D509 Iron deficiency anemia, unspecified: Secondary | ICD-10-CM

## 2023-02-23 DIAGNOSIS — C9002 Multiple myeloma in relapse: Secondary | ICD-10-CM | POA: Insufficient documentation

## 2023-02-23 DIAGNOSIS — Z5111 Encounter for antineoplastic chemotherapy: Secondary | ICD-10-CM | POA: Insufficient documentation

## 2023-02-23 LAB — FERRITIN: Ferritin: 128 ng/mL (ref 24–336)

## 2023-02-23 LAB — CMP (CANCER CENTER ONLY)
ALT: 17 U/L (ref 0–44)
AST: 11 U/L — ABNORMAL LOW (ref 15–41)
Albumin: 3.6 g/dL (ref 3.5–5.0)
Alkaline Phosphatase: 99 U/L (ref 38–126)
Anion gap: 7 (ref 5–15)
BUN: 14 mg/dL (ref 8–23)
CO2: 27 mmol/L (ref 22–32)
Calcium: 9 mg/dL (ref 8.9–10.3)
Chloride: 109 mmol/L (ref 98–111)
Creatinine: 0.72 mg/dL (ref 0.61–1.24)
GFR, Estimated: >60 mL/min (ref >60)
Glucose, Bld: 102 mg/dL — ABNORMAL HIGH (ref 70–99)
Potassium: 3.7 mmol/L (ref 3.5–5.1)
Sodium: 143 mmol/L (ref 135–145)
Total Bilirubin: 0.4 mg/dL (ref 0.3–1.2)
Total Protein: 6.3 g/dL — ABNORMAL LOW (ref 6.5–8.1)

## 2023-02-23 LAB — CBC WITH DIFFERENTIAL (CANCER CENTER ONLY)
Abs Immature Granulocytes: 0.05 10*3/uL (ref 0.00–0.07)
Basophils Absolute: 0 10*3/uL (ref 0.0–0.1)
Basophils Relative: 0 %
Eosinophils Absolute: 0.6 10*3/uL — ABNORMAL HIGH (ref 0.0–0.5)
Eosinophils Relative: 6 %
HCT: 32.5 % — ABNORMAL LOW (ref 39.0–52.0)
Hemoglobin: 11 g/dL — ABNORMAL LOW (ref 13.0–17.0)
Immature Granulocytes: 1 %
Lymphocytes Relative: 11 %
Lymphs Abs: 1.1 10*3/uL (ref 0.7–4.0)
MCH: 25.6 pg — ABNORMAL LOW (ref 26.0–34.0)
MCHC: 33.8 g/dL (ref 30.0–36.0)
MCV: 75.6 fL — ABNORMAL LOW (ref 80.0–100.0)
Monocytes Absolute: 0.9 10*3/uL (ref 0.1–1.0)
Monocytes Relative: 9 %
Neutro Abs: 7.2 10*3/uL (ref 1.7–7.7)
Neutrophils Relative %: 73 %
Platelet Count: 238 10*3/uL (ref 150–400)
RBC: 4.3 MIL/uL (ref 4.22–5.81)
RDW: 20.6 % — ABNORMAL HIGH (ref 11.5–15.5)
WBC Count: 9.9 10*3/uL (ref 4.0–10.5)
nRBC: 0 % (ref 0.0–0.2)

## 2023-02-23 LAB — IRON AND IRON BINDING CAPACITY (CC-WL,HP ONLY)
Iron: 43 ug/dL — ABNORMAL LOW (ref 45–182)
Saturation Ratios: 16 % — ABNORMAL LOW (ref 17.9–39.5)
TIBC: 274 ug/dL (ref 250–450)
UIBC: 231 ug/dL (ref 117–376)

## 2023-02-23 MED ORDER — SODIUM CHLORIDE 0.9 % IV SOLN: Freq: Once | INTRAVENOUS | Status: AC

## 2023-02-23 MED ORDER — SODIUM CHLORIDE 0.9 % IV SOLN
40.0000 mg | Freq: Once | INTRAVENOUS | Status: AC
  Administered 2023-02-23: 40 mg via INTRAVENOUS
  Filled 2023-02-23: qty 4

## 2023-02-23 MED ORDER — DEXTROSE 5 % IV SOLN
70.0000 mg/m2 | Freq: Once | INTRAVENOUS | Status: AC
  Administered 2023-02-23: 150 mg via INTRAVENOUS
  Filled 2023-02-23: qty 60

## 2023-02-23 NOTE — Patient Instructions (Signed)
[place] [facility]  Discharge Instructions: Thank you for choosing [facility] to provide your oncology and hematology care.   If you have a lab appointment with the Cancer Center, please go directly to the Cancer Center and check in at the registration area.   Wear comfortable clothing and clothing appropriate for easy access to any Portacath or PICC line.   We strive to give you quality time with your provider. You may need to reschedule your appointment if you arrive late (15 or more minutes).  Arriving late affects you and other patients whose appointments are after yours.  Also, if you miss three or more appointments without notifying the office, you may be dismissed from the clinic at the provider's discretion.      For prescription refill requests, have your pharmacy contact our office and allow 72 hours for refills to be completed.    Today you received the following chemotherapy and/or immunotherapy agents: Kyrolis.       To help prevent nausea and vomiting after your treatment, we encourage you to take your nausea medication as directed.  BELOW ARE SYMPTOMS THAT SHOULD BE REPORTED IMMEDIATELY: *FEVER GREATER THAN 100.4 F (38 C) OR HIGHER *CHILLS OR SWEATING *NAUSEA AND VOMITING THAT IS NOT CONTROLLED WITH YOUR NAUSEA MEDICATION *UNUSUAL SHORTNESS OF BREATH *UNUSUAL BRUISING OR BLEEDING *URINARY PROBLEMS (pain or burning when urinating, or frequent urination) *BOWEL PROBLEMS (unusual diarrhea, constipation, pain near the anus) TENDERNESS IN MOUTH AND THROAT WITH OR WITHOUT PRESENCE OF ULCERS (sore throat, sores in mouth, or a toothache) UNUSUAL RASH, SWELLING OR PAIN  UNUSUAL VAGINAL DISCHARGE OR ITCHING   Items with * indicate a potential emergency and should be followed up as soon as possible or go to the Emergency Department if any problems should occur.  Please show the CHEMOTHERAPY ALERT CARD or IMMUNOTHERAPY ALERT CARD at  check-in to the Emergency Department and triage nurse.  Should you have questions after your visit or need to cancel or reschedule your appointment, please contact [place] [facility]  Dept: [phone]  and follow the prompts.  Office hours are 8:00 a.m. to 4:30 p.m. Monday - Friday. Please note that voicemails left after 4:00 p.m. may not be returned until the following business day.  We are closed weekends and major holidays. You have access to a nurse at all times for urgent questions. Please call the main number to the clinic Dept: [phone] and follow the prompts.   For any non-urgent questions, you may also contact your provider using MyChart. We now offer e-Visits for anyone 34 and older to request care online for non-urgent symptoms. For details visit [email or web address].   Also download the MyChart app! Go to the app store, search "MyChart", open the app, select [facility], and log in with your MyChart username and password.

## 2023-03-02 ENCOUNTER — Inpatient Hospital Stay: Payer: Medicare (Managed Care)

## 2023-03-02 ENCOUNTER — Other Ambulatory Visit: Payer: Self-pay

## 2023-03-02 ENCOUNTER — Ambulatory Visit: Payer: Medicare (Managed Care) | Admitting: Hematology and Oncology

## 2023-03-02 ENCOUNTER — Telehealth: Payer: Self-pay

## 2023-03-02 VITALS — BP 147/71 | HR 81 | Temp 98.3°F | Resp 18

## 2023-03-02 DIAGNOSIS — C9002 Multiple myeloma in relapse: Secondary | ICD-10-CM

## 2023-03-02 DIAGNOSIS — Z5111 Encounter for antineoplastic chemotherapy: Secondary | ICD-10-CM | POA: Diagnosis not present

## 2023-03-02 LAB — CMP (CANCER CENTER ONLY)
ALT: 21 U/L (ref 0–44)
AST: 12 U/L — ABNORMAL LOW (ref 15–41)
Albumin: 3.6 g/dL (ref 3.5–5.0)
Alkaline Phosphatase: 97 U/L (ref 38–126)
Anion gap: 7 (ref 5–15)
BUN: 14 mg/dL (ref 8–23)
CO2: 25 mmol/L (ref 22–32)
Calcium: 9.2 mg/dL (ref 8.9–10.3)
Chloride: 113 mmol/L — ABNORMAL HIGH (ref 98–111)
Creatinine: 0.75 mg/dL (ref 0.61–1.24)
GFR, Estimated: >60 mL/min (ref >60)
Glucose, Bld: 107 mg/dL — ABNORMAL HIGH (ref 70–99)
Potassium: 3.7 mmol/L (ref 3.5–5.1)
Sodium: 145 mmol/L (ref 135–145)
Total Bilirubin: 0.4 mg/dL (ref 0.3–1.2)
Total Protein: 6.3 g/dL — ABNORMAL LOW (ref 6.5–8.1)

## 2023-03-02 LAB — CBC WITH DIFFERENTIAL (CANCER CENTER ONLY)
Abs Immature Granulocytes: 0.04 10*3/uL (ref 0.00–0.07)
Basophils Absolute: 0 10*3/uL (ref 0.0–0.1)
Basophils Relative: 0 %
Eosinophils Absolute: 0.4 10*3/uL (ref 0.0–0.5)
Eosinophils Relative: 4 %
HCT: 33.3 % — ABNORMAL LOW (ref 39.0–52.0)
Hemoglobin: 11.2 g/dL — ABNORMAL LOW (ref 13.0–17.0)
Immature Granulocytes: 0 %
Lymphocytes Relative: 10 %
Lymphs Abs: 1 10*3/uL (ref 0.7–4.0)
MCH: 25.1 pg — ABNORMAL LOW (ref 26.0–34.0)
MCHC: 33.6 g/dL (ref 30.0–36.0)
MCV: 74.5 fL — ABNORMAL LOW (ref 80.0–100.0)
Monocytes Absolute: 0.9 10*3/uL (ref 0.1–1.0)
Monocytes Relative: 9 %
Neutro Abs: 7.2 10*3/uL (ref 1.7–7.7)
Neutrophils Relative %: 77 %
Platelet Count: 223 10*3/uL (ref 150–400)
RBC: 4.47 MIL/uL (ref 4.22–5.81)
RDW: 20.5 % — ABNORMAL HIGH (ref 11.5–15.5)
WBC Count: 9.4 10*3/uL (ref 4.0–10.5)
nRBC: 0 % (ref 0.0–0.2)

## 2023-03-02 MED ORDER — SODIUM CHLORIDE 0.9 % IV SOLN
40.0000 mg | Freq: Once | INTRAVENOUS | Status: AC
  Administered 2023-03-02: 40 mg via INTRAVENOUS
  Filled 2023-03-02: qty 4

## 2023-03-02 MED ORDER — SODIUM CHLORIDE 0.9 % IV SOLN: Freq: Once | INTRAVENOUS | Status: AC

## 2023-03-02 MED ORDER — DEXTROSE 5 % IV SOLN
70.0000 mg/m2 | Freq: Once | INTRAVENOUS | Status: AC
  Administered 2023-03-02: 150 mg via INTRAVENOUS
  Filled 2023-03-02: qty 60

## 2023-03-02 NOTE — Telephone Encounter (Signed)
-----   Message from Briant Cedar sent at 03/01/2023  4:49 PM EDT ----- Please advise him to resume oral iron pills. ----- Message ----- From: Daneil Dolin, LPN Sent: 11/25/979   2:49 PM EDT To: Briant Cedar, PA-C  Spoke with pt's wife and she confirmed with [facility] that he has not been taking oral iron ----- Message ----- From: Raymondo Band Sent: 02/26/2023   1:22 PM EDT To: Daneil Dolin, LPN  His iron levels were slightly below normal. Can you check if he is taking iron pills?  Thanks, Karena Addison ----- Message ----- From: Leory Plowman, Lab In [place] Sent: 02/23/2023   2:42 PM EDT To: Briant Cedar, PA-C

## 2023-03-02 NOTE — Telephone Encounter (Signed)
Spoke with pt and advised to resume oral iron.  He stated he started taking it again on Saturday 8/3

## 2023-03-02 NOTE — Patient Instructions (Signed)
[facility]  Discharge Instructions: Thank you for choosing [facility] to provide your oncology and hematology care.   If you have a lab appointment with the Cancer Center, please go directly to the Cancer Center and check in at the registration area.   Wear comfortable clothing and clothing appropriate for easy access to any Portacath or PICC line.   We strive to give you quality time with your provider. You may need to reschedule your appointment if you arrive late (15 or more minutes).  Arriving late affects you and other patients whose appointments are after yours.  Also, if you miss three or more appointments without notifying the office, you may be dismissed from the clinic at the provider's discretion.      For prescription refill requests, have your pharmacy contact our office and allow 72 hours for refills to be completed.    Today you received the following chemotherapy and/or immunotherapy agents :  Kyprolis   To help prevent nausea and vomiting after your treatment, we encourage you to take your nausea medication as directed.  BELOW ARE SYMPTOMS THAT SHOULD BE REPORTED IMMEDIATELY: *FEVER GREATER THAN 100.4 F (38 C) OR HIGHER *CHILLS OR SWEATING *NAUSEA AND VOMITING THAT IS NOT CONTROLLED WITH YOUR NAUSEA MEDICATION *UNUSUAL SHORTNESS OF BREATH *UNUSUAL BRUISING OR BLEEDING *URINARY PROBLEMS (pain or burning when urinating, or frequent urination) *BOWEL PROBLEMS (unusual diarrhea, constipation, pain near the anus) TENDERNESS IN MOUTH AND THROAT WITH OR WITHOUT PRESENCE OF ULCERS (sore throat, sores in mouth, or a toothache) UNUSUAL RASH, SWELLING OR PAIN  UNUSUAL VAGINAL DISCHARGE OR ITCHING   Items with * indicate a potential emergency and should be followed up as soon as possible or go to the Emergency Department if any problems should occur.  Please show the CHEMOTHERAPY ALERT CARD or IMMUNOTHERAPY ALERT CARD at  check-in to the Emergency Department and triage nurse.  Should you have questions after your visit or need to cancel or reschedule your appointment, please contact [facility]  Dept: [phone]  and follow the prompts.  Office hours are 8:00 a.m. to 4:30 p.m. Monday - Friday. Please note that voicemails left after 4:00 p.m. may not be returned until the following business day.  We are closed weekends and major holidays. You have access to a nurse at all times for urgent questions. Please call the main number to the clinic Dept: [phone] and follow the prompts.   For any non-urgent questions, you may also contact your provider using MyChart. We now offer e-Visits for anyone 18 and older to request care online for non-urgent symptoms. For details visit [email or web address].   Also download the MyChart app! Go to the app store, search "MyChart", open the app, select [facility], and log in with your MyChart username and password.   

## 2023-03-05 ENCOUNTER — Encounter (HOSPITAL_BASED_OUTPATIENT_CLINIC_OR_DEPARTMENT_OTHER): Payer: Medicare (Managed Care) | Attending: Internal Medicine | Admitting: Internal Medicine

## 2023-03-05 DIAGNOSIS — N493 Fournier gangrene: Secondary | ICD-10-CM

## 2023-03-05 DIAGNOSIS — G8220 Paraplegia, unspecified: Secondary | ICD-10-CM | POA: Diagnosis not present

## 2023-03-05 DIAGNOSIS — H409 Unspecified glaucoma: Secondary | ICD-10-CM | POA: Diagnosis not present

## 2023-03-05 DIAGNOSIS — E1152 Type 2 diabetes mellitus with diabetic peripheral angiopathy with gangrene: Secondary | ICD-10-CM | POA: Diagnosis present

## 2023-03-05 DIAGNOSIS — C9000 Multiple myeloma not having achieved remission: Secondary | ICD-10-CM | POA: Diagnosis not present

## 2023-03-05 DIAGNOSIS — L98492 Non-pressure chronic ulcer of skin of other sites with fat layer exposed: Secondary | ICD-10-CM | POA: Diagnosis not present

## 2023-03-05 DIAGNOSIS — G4730 Sleep apnea, unspecified: Secondary | ICD-10-CM | POA: Diagnosis not present

## 2023-03-05 DIAGNOSIS — Z86718 Personal history of other venous thrombosis and embolism: Secondary | ICD-10-CM | POA: Diagnosis not present

## 2023-03-05 DIAGNOSIS — Z833 Family history of diabetes mellitus: Secondary | ICD-10-CM | POA: Insufficient documentation

## 2023-03-05 DIAGNOSIS — S31501A Unspecified open wound of unspecified external genital organs, male, initial encounter: Secondary | ICD-10-CM | POA: Diagnosis not present

## 2023-03-05 DIAGNOSIS — E2740 Unspecified adrenocortical insufficiency: Secondary | ICD-10-CM | POA: Diagnosis not present

## 2023-03-05 NOTE — Progress Notes (Addendum)
KENNIETH, WINIARSKI ([id number]) [id number] Page 1 of 7 Visit Report for 03/05/2023 Chief Complaint Document Details Patient Name: Date of Service: [name]. 03/05/2023 8:00 A M Medical Record Number: [id number] Patient Account Number: [id number] Date of Birth/Sex: Treating RN: [birth year] (67 y.o. M) Primary Care Provider: Jethro Bastos Other Clinician: Referring Provider: Treating Provider/Extender: Vista Lawman in Treatment: 2 Information Obtained from: Patient Chief Complaint 02/13/2023; right groin wound Electronic Signature(s) Signed: 03/05/2023 8:49:18 AM By: [name] DO Entered By: [name] on 03/05/2023 08:45:40 -------------------------------------------------------------------------------- HPI Details Patient Name: Date of Service: [name]. 03/05/2023 8:00 A M Medical Record Number: [id number] Patient Account Number: [id number] Date of Birth/Sex: Treating RN: June 03, 1956 (67 y.o. M) Primary Care Provider: Jethro Bastos Other Clinician: Referring Provider: Treating Provider/Extender: Vista Lawman in Treatment: 2 History of Present Illness HPI Description: 02/13/2023 [name] is a 67 year old male with a past medical history of paraplegia and multiple myeloma that presents to the clinic for a 1-45-month history of wound to his right groin. Patient developed an abscess and required incision and drainage of the scrotum on 6/3 and 6/7 by Dr. Angelica Chessman, urology. He was diagnosed with foreigners gangrene. He completed a course of doxycycline and Augmentin. He followed with infectious disease with no further current recommendations. He currently denies signs of infection. He is using normal saline wet-to-dry dressings. He has no issues or complaints. He still has 5 sutures in place that are not keeping the skin together. 8/12; patient presents for  follow-up. He has been using Vashe wet-to-dry dressings daily. He has no issues or complaints. Wound is smaller. Electronic Signature(s) Signed: 03/05/2023 8:49:18 AM By: [name] DO Entered By: [name] on 03/05/2023 08:46:00 -------------------------------------------------------------------------------- Physical Exam Details Patient Name: Date of Service: [name]. 03/05/2023 8:00 A M Medical Record Number: [id number] Patient Account Number: [id number] Date of Birth/Sex: Treating RN: 1955-09-02 (67 y.o. Kingdon Lenzini, Robert Bellow ([id number]) [id number] Page 2 of 7 Primary Care Provider: Jethro Bastos Other Clinician: Referring Provider: Treating Provider/Extender: Vista Lawman in Treatment: 2 Constitutional respirations regular, non-labored and within target range for patient.Marland Kitchen Psychiatric pleasant and cooperative. Notes T the right groin there is an open wound with granulation tissue throughout. No signs of surrounding infection including increased warmth, erythema or purulent o drainage. Electronic Signature(s) Signed: 03/05/2023 8:49:18 AM By: [name] DO Entered By: [name] on 03/05/2023 08:46:43 -------------------------------------------------------------------------------- Physician Orders Details Patient Name: Date of Service: [name]. 03/05/2023 8:00 A M Medical Record Number: [id number] Patient Account Number: [id number] Date of Birth/Sex: Treating RN: August 29, 1955 (67 y.o. Dianna Limbo Primary Care Provider: Jethro Bastos Other Clinician: Referring Provider: Treating Provider/Extender: Vista Lawman in Treatment: 2 Verbal / Phone Orders: No Diagnosis Coding Follow-up Appointments ppointment in 2 weeks. - +++ HOYER++++Extra TIME Dr. Mikey Bussing room 8 Monday 8 am on 03/19/2023 Return A ppointment in: - x2 weeks follow  up Return A Dr. Mikey Bussing ****HOYER ***** extra time 60 minutes Bathing/ Shower/ Hygiene May shower with protection but do not get wound dressing(s) wet. Protect dressing(s) with water repellant cover (for example, large plastic bag) or a cast cover and may then take shower. Additional Orders / Instructions Other: - keep groin area clean and dry. Home Health New wound care orders this week; continue Home Health for wound care. May  utilize formulary equivalent dressing for wound treatment orders unless otherwise specified. - vashe wet to dry- daily dressing changes. Other Home Health Orders/Instructions: - [facility]. Wound Treatment Wound #4 - Groin Wound Laterality: Right Cleanser: Wound Cleanser (Home Health) 1 x Per Day/30 Days Discharge Instructions: Cleanse the wound with wound cleanser prior to applying a clean dressing using gauze sponges, not tissue or cotton balls. Prim Dressing: Vashe wet to dry (Home Health) 1 x Per Day/30 Days ary Discharge Instructions: apply directly to wound bed. lightly pack. Secondary Dressing: ABD Pad, 5x9 (Home Health) 1 x Per Day/30 Days Discharge Instructions: Apply over primary dressing as directed. Electronic Signature(s) Signed: 03/05/2023 8:49:18 AM By: [name] DO Entered By: [name] on 03/05/2023 08:48:08 [name] ([id number]) [id number] Page 3 of 7 -------------------------------------------------------------------------------- Problem List Details Patient Name: Date of Service: [name]. 03/05/2023 8:00 A M Medical Record Number: [id number] Patient Account Number: [id number] Date of Birth/Sex: Treating RN: 1955-12-02 (67 y.o. M) Primary Care Provider: Jethro Bastos Other Clinician: Referring Provider: Treating Provider/Extender: Vista Lawman in Treatment: 2 Active Problems ICD-10 Encounter Code Description Active Date  MDM Diagnosis [phone] Non-pressure chronic ulcer of skin of other sites with fat layer exposed 02/13/2023 No Yes S31.501A Unspecified open wound of unspecified external genital organs, male, initial 02/13/2023 No Yes encounter N49.3 Fournier gangrene 02/13/2023 No Yes G82.20 Paraplegia, unspecified 02/13/2023 No Yes Inactive Problems Resolved Problems Electronic Signature(s) Signed: 03/05/2023 8:49:18 AM By: [name] DO Entered By: [name] on 03/05/2023 08:45:22 -------------------------------------------------------------------------------- Progress Note Details Patient Name: Date of Service: Hattie Perch W. 03/05/2023 8:00 A M Medical Record Number: [id number] Patient Account Number: [id number] Date of Birth/Sex: Treating RN: Feb 22, 1956 (67 y.o. M) Primary Care Provider: Jethro Bastos Other Clinician: Referring Provider: Treating Provider/Extender: Vista Lawman in Treatment: 2 Subjective Chief Complaint Information obtained from Patient 02/13/2023; right groin wound History of Present Illness (HPI) 02/13/2023 [name] is a 67 year old male with a past medical history of paraplegia and multiple myeloma that presents to the clinic for a 1-70-month history of wound to his right groin. Patient developed an abscess and required incision and drainage of the scrotum on 6/3 and 6/7 by Dr. Angelica Chessman, urology. He was diagnosed with foreigners gangrene. He completed a course of doxycycline and Augmentin. He followed with infectious disease with no further current DHRUVA, FEARNLEY ([id number]) [id number] Page 4 of 7 recommendations. He currently denies signs of infection. He is using normal saline wet-to-dry dressings. He has no issues or complaints. He still has 5 sutures in place that are not keeping the skin together. 8/12; patient presents for follow-up. He has been using Vashe wet-to-dry dressings daily.  He has no issues or complaints. Wound is smaller. Patient History Family History Cancer - Mother, Diabetes - Father. Social History Never smoker, Marital Status - Married, Alcohol Use - Never, Drug Use - No History, Caffeine Use - Never. Medical History Eyes Patient has history of Glaucoma Hematologic/Lymphatic Patient has history of Anemia Respiratory Patient has history of Sleep Apnea Cardiovascular Patient has history of Deep Vein Thrombosis - xarelto Endocrine Patient has history of Type II Diabetes Neurologic Patient has history of Paraplegia Hospitalization/Surgery History - 12/29/2022 scrotal exploration. - 12/25/2022 IandD abscess. - 06/2011 colostomy. - 03/2013 suprapubic catheter. Medical A Surgical History Notes nd Constitutional Symptoms (General Health) adrenal insufficiency Gastrointestinal neurogenic bowel, SBO Genitourinary neurogenic bladder Oncologic Multiple  myeloma Objective Constitutional respirations regular, non-labored and within target range for patient.. Vitals Time Taken: 8:12 AM, Height: 68 in, Weight: 230 lbs, BMI: 35, Temperature: 98.3 F, Pulse: 110 bpm, Respiratory Rate: 18 breaths/min, Blood Pressure: 138/82 mmHg. Psychiatric pleasant and cooperative. General Notes: T the right groin there is an open wound with granulation tissue throughout. No signs of surrounding infection including increased warmth, o erythema or purulent drainage. Integumentary (Hair, Skin) Wound #4 status is Open. Original cause of wound was Bump. The date acquired was: 12/25/2022. The wound has been in treatment 2 weeks. The wound is located on the Right Groin. The wound measures 5.7cm length x 2.5cm width x 1.5cm depth; 11.192cm^2 area and 16.788cm^3 volume. There is Fat Layer (Subcutaneous Tissue) exposed. There is no tunneling or undermining noted. There is a medium amount of serosanguineous drainage noted. The wound margin is epibole. There is large (67-100%) red  granulation within the wound bed. There is no necrotic tissue within the wound bed. The periwound skin appearance did not exhibit: Callus, Crepitus, Excoriation, Induration, Rash, Scarring, Dry/Scaly, Maceration, Atrophie Blanche, Cyanosis, Ecchymosis, Hemosiderin Staining, Mottled, Pallor, Rubor, Erythema. Assessment Active Problems ICD-10 Non-pressure chronic ulcer of skin of other sites with fat layer exposed Unspecified open wound of unspecified external genital organs, male, initial encounter Fournier gangrene Paraplegia, unspecified FARSHAD, WOLINSKI ([id number]) [id number] Page 5 of 7 Patient's wound has shown improvement in size in appearance since last clinic visit. I recommended continuing Vashe wet-to-dry dressings. Follow-up in 2 weeks. Plan Follow-up Appointments: Return Appointment in 2 weeks. - +++ HOYER++++Extra TIME Dr. Mikey Bussing room 8 Monday 8 am on 03/19/2023 Return Appointment in: - x2 weeks follow up Dr. Mikey Bussing ****Michiel Sites ***** extra time 60 minutes Bathing/ Shower/ Hygiene: May shower with protection but do not get wound dressing(s) wet. Protect dressing(s) with water repellant cover (for example, large plastic bag) or a cast cover and may then take shower. Additional Orders / Instructions: Other: - keep groin area clean and dry. Home Health: New wound care orders this week; continue Home Health for wound care. May utilize formulary equivalent dressing for wound treatment orders unless otherwise specified. - vashe wet to dry- daily dressing changes. Other Home Health Orders/Instructions: - [facility]. WOUND #4: - Groin Wound Laterality: Right Cleanser: Wound Cleanser (Home Health) 1 x Per Day/30 Days Discharge Instructions: Cleanse the wound with wound cleanser prior to applying a clean dressing using gauze sponges, not tissue or cotton balls. Prim Dressing: Vashe wet to dry (Home Health) 1 x Per Day/30 Days ary Discharge  Instructions: apply directly to wound bed. lightly pack. Secondary Dressing: ABD Pad, 5x9 (Home Health) 1 x Per Day/30 Days Discharge Instructions: Apply over primary dressing as directed. 1. Vashe wet-to-dry dressings 2. Follow-up in 2 weeks Electronic Signature(s) Signed: 03/05/2023 8:49:18 AM By: [name] DO Entered By: [name] on 03/05/2023 08:48:33 -------------------------------------------------------------------------------- HxROS Details Patient Name: Date of Service: Gary Banner, DO Lajoyce Lauber. 03/05/2023 8:00 A M Medical Record Number: [id number] Patient Account Number: [id number] Date of Birth/Sex: Treating RN: June 06, 1956 (67 y.o. M) Primary Care Provider: Jethro Bastos Other Clinician: Referring Provider: Treating Provider/Extender: Vista Lawman in Treatment: 2 Constitutional Symptoms (General Health) Medical History: Past Medical History Notes: adrenal insufficiency Eyes Medical History: Positive for: Glaucoma Hematologic/Lymphatic Medical History: Positive for: Anemia Respiratory Medical History: Positive for: Sleep Apnea Cardiovascular Medical History: Positive for: Deep Vein Thrombosis - DARQUAN, HILES ([id number]) [id number] Page 6 of 7 Gastrointestinal  Medical History: Past Medical History Notes: neurogenic bowel, SBO Endocrine Medical History: Positive for: Type II Diabetes Genitourinary Medical History: Past Medical History Notes: neurogenic bladder Neurologic Medical History: Positive for: Paraplegia Oncologic Medical History: Past Medical History Notes: Multiple myeloma HBO Extended History Items Eyes: Glaucoma Immunizations Pneumococcal Vaccine: Received Pneumococcal Vaccination: No Implantable Devices No devices added Hospitalization / Surgery History Type of Hospitalization/Surgery 12/29/2022 scrotal exploration 12/25/2022 IandD abscess 06/2011  colostomy 03/2013 suprapubic catheter Family and Social History Cancer: Yes - Mother; Diabetes: Yes - Father; Never smoker; Marital Status - Married; Alcohol Use: Never; Drug Use: No History; Caffeine Use: Never; Financial Concerns: No; Food, Clothing or Shelter Needs: No; Support System Lacking: No; Transportation Concerns: No Electronic Signature(s) Signed: 03/05/2023 8:49:18 AM By: [name] DO Entered By: [name] on 03/05/2023 08:46:06 -------------------------------------------------------------------------------- SuperBill Details Patient Name: Date of Service: [name]. 03/05/2023 Medical Record Number: [id number] Patient Account Number: [id number] Date of Birth/Sex: Treating RN: 1956/05/23 (67 y.o. M) Primary Care Provider: Jethro Bastos Other Clinician: Referring Provider: Treating Provider/Extender: Vista Lawman in Treatment: 2 Diagnosis Coding GAMALIEL, LUNDIE ([id number]) [id number] Page 7 of 7 ICD-10 Codes Code Description [phone] Non-pressure chronic ulcer of skin of other sites with fat layer exposed S31.501A Unspecified open wound of unspecified external genital organs, male, initial encounter N49.3 Fournier gangrene G82.20 Paraplegia, unspecified Facility Procedures : CPT4 Code: [id number] Description: 99213 - WOUND CARE VISIT-LEV 3 EST PT Modifier: Quantity: 1 Physician Procedures : CPT4 Code Description Modifier [id number] - WC PHYS LEVEL 3 - EST PT ICD-10 Diagnosis Description L98.492 Non-pressure chronic ulcer of skin of other sites with fat layer exposed S31.501A Unspecified open wound of unspecified external genital  organs, male, initial encounter N49.3 Fournier gangrene G82.20 Paraplegia, unspecified Quantity: 1 Electronic Signature(s) Signed: 03/05/2023 12:36:03 PM By: [name] DO Signed: 03/05/2023 2:20:01 PM By: Karie Schwalbe RN Previous Signature: 03/05/2023  8:49:18 AM Version By: [name] DO Entered By: Karie Schwalbe on 03/05/2023 09:03:33

## 2023-03-05 NOTE — Progress Notes (Signed)
MARQUAY, COULON ([id number]) [id number] Page 1 of 8 Visit Report for 03/05/2023 Arrival Information Details Patient Name: Date of Service: [name]. 03/05/2023 8:00 A M Medical Record Number: [id number] Patient Account Number: [id number] Date of Birth/Sex: Treating RN: 1956-05-25 (67 y.o. M) Primary Care : Jethro Bastos Other Clinician: Referring : Treating /Extender: Vista Lawman in Treatment: 2 Visit Information History Since Last Visit All ordered tests and consults were completed: No Patient Arrived: Wheel Chair Added or deleted any medications: No Arrival Time: 08:12 Any new allergies or adverse reactions: No Accompanied By: self Had a fall or experienced change in No Transfer Assistance: Nurse, adult activities of daily living that may affect Patient Identification Verified: Yes risk of falls: Secondary Verification Process Completed: Yes Signs or symptoms of abuse/neglect since last visito No Patient Requires Transmission-Based Precautions: No Hospitalized since last visit: No Patient Has Alerts: Yes Implantable device outside of the clinic excluding No Patient Alerts: Patient on Blood Thinner cellular tissue based products placed in the center Xarelto since last visit: Pain Present Now: No Electronic Signature(s) Signed: 03/05/2023 8:36:14 AM By: Dayton Scrape Entered By: Dayton Scrape on 03/05/2023 08:12:40 -------------------------------------------------------------------------------- Clinic Level of Care Assessment Details Patient Name: Date of Service: [facility]. 03/05/2023 8:00 A M Medical Record Number: [id number] Patient Account Number: [id number] Date of Birth/Sex: Treating RN: 07/15/1956 (67 y.o. Dianna Limbo Primary Care : Jethro Bastos Other Clinician: Referring : Treating /Extender: Vista Lawman  in Treatment: 2 Clinic Level of Care Assessment Items TOOL 1 Quantity Score X- 1 0 Use when EandM and Procedure is performed on INITIAL visit ASSESSMENTS - Nursing Assessment / Reassessment X- 1 20 General Physical Exam (combine w/ comprehensive assessment (listed just below) when performed on new pt. evals) X- 1 25 Comprehensive Assessment (HX, ROS, Risk Assessments, Wounds Hx, etc.) ASSESSMENTS - Wound and Skin Assessment / Reassessment X- 1 10 Dermatologic / Skin Assessment (not related to wound area) ASSESSMENTS - Ostomy and/or Continence Assessment and Care []  - 0 Incontinence Assessment and Management []  - 0 Ostomy Care Assessment and Management (repouching, etc.) PROCESS - Coordination of Care X - Simple Patient / Family Education for ongoing care 1 15 []  - 0 Complex (extensive) Patient / Family Education for ongoing care [name], [name] ([id number]) [id number] Page 2 of 8 X- 1 10 Staff obtains Consents, Records, T Results / Process Orders est X- 1 10 Staff telephones HHA, Nursing Homes / Clarify orders / etc []  - 0 Routine Transfer to another Facility (non-emergent condition) []  - 0 Routine Hospital Admission (non-emergent condition) X- 1 15 New Admissions / Manufacturing engineer / Ordering NPWT Apligraf, etc. , []  - 0 Emergency Hospital Admission (emergent condition) PROCESS - Special Needs []  - 0 Pediatric / Minor Patient Management []  - 0 Isolation Patient Management []  - 0 Hearing / Language / Visual special needs []  - 0 Assessment of Community assistance (transportation, D/C planning, etc.) []  - 0 Additional assistance / Altered mentation []  - 0 Support Surface(s) Assessment (bed, cushion, seat, etc.) INTERVENTIONS - Miscellaneous []  - 0 External ear exam []  - 0 Patient Transfer (multiple staff / Nurse, adult / Similar devices) []  - 0 Simple Staple / Suture removal (25 or less) []  - 0 Complex Staple / Suture removal  (26 or more) []  - 0 Hypo/Hyperglycemic Management (do not check if billed separately) []  - 0 Ankle / Brachial Index (ABI) -  do not check if billed separately Has the patient been seen at the hospital within the last three years: Yes Total Score: 105 Level Of Care: New/Established - Level 3 Electronic Signature(s) Signed: 03/05/2023 2:20:01 PM By: Karie Schwalbe RN Entered By: Karie Schwalbe on 03/05/2023 09:03:22 -------------------------------------------------------------------------------- Encounter Discharge Information Details Patient Name: Date of Service: [name]. 03/05/2023 8:00 A M Medical Record Number: [id number] Patient Account Number: [id number] Date of Birth/Sex: Treating RN: [birth year] (67 y.o. Dianna Limbo Primary Care : Jethro Bastos Other Clinician: Referring : Treating /Extender: Vista Lawman in Treatment: 2 Encounter Discharge Information Items Discharge Condition: Stable Ambulatory Status: Wheelchair Discharge Destination: Home Transportation: Private Auto Accompanied By: self Schedule Follow-up Appointment: Yes Clinical Summary of Care: Patient Declined Notes [facility] assists this patient. Electronic Signature(s) Signed: 03/05/2023 2:20:01 PM By: Karie Schwalbe RN Entered By: Karie Schwalbe on 03/05/2023 09:04:34 [name] ([id number]) [id number] Page 3 of 8 -------------------------------------------------------------------------------- Lower Extremity Assessment Details Patient Name: Date of Service: [name]. 03/05/2023 8:00 A M Medical Record Number: [id number] Patient Account Number: [id number] Date of Birth/Sex: Treating RN: May 31, 1956 (67 y.o. Dianna Limbo Primary Care : Jethro Bastos Other Clinician: Referring : Treating /Extender: Vista Lawman in  Treatment: 2 Electronic Signature(s) Signed: 03/05/2023 2:20:01 PM By: Karie Schwalbe RN Entered By: Karie Schwalbe on 03/05/2023 08:13:07 -------------------------------------------------------------------------------- Multi Wound Chart Details Patient Name: Date of Service: [name]. 03/05/2023 8:00 A M Medical Record Number: [id number] Patient Account Number: [id number] Date of Birth/Sex: Treating RN: Jul 07, 1956 (67 y.o. M) Primary Care : Jethro Bastos Other Clinician: Referring : Treating /Extender: Vista Lawman in Treatment: 2 Vital Signs Height(in): 68 Pulse(bpm): 110 Weight(lbs): 230 Blood Pressure(mmHg): 138/82 Body Mass Index(BMI): 35 Temperature(F): 98.3 Respiratory Rate(breaths/min): 18 [4:Photos:] [N/A:N/A] Right Groin N/A N/A Wound Location: Bump N/A N/A Wounding Event: Open Surgical Wound N/A N/A Primary Etiology: Glaucoma, Anemia, Sleep Apnea, N/A N/A Comorbid History: Deep Vein Thrombosis, Type II Diabetes, Paraplegia 12/25/2022 N/A N/A Date Acquired: 2 N/A N/A Weeks of Treatment: Open N/A N/A Wound Status: No N/A N/A Wound Recurrence: 5.7x2.5x1.5 N/A N/A Measurements L x W x D (cm) 11.192 N/A N/A A (cm) : rea 16.788 N/A N/A Volume (cm) : -9.60% N/A N/A % Reduction in Area: 45.20% N/A N/A % Reduction in Volume: Full Thickness Without Exposed N/A N/A Classification: Support Structures Medium N/A N/A Exudate Amount: Serosanguineous N/A N/A Exudate Type: red, brown N/A N/A Exudate ColorJAHZEEL, [name] ([id number]) [id number] Page 4 of 8 Epibole N/A N/A Wound Margin: Large (67-100%) N/A N/A Granulation Amount: Red N/A N/A Granulation Quality: None Present (0%) N/A N/A Necrotic Amount: Fat Layer (Subcutaneous Tissue): Yes N/A N/A Exposed Structures: Fascia: No Tendon: No Muscle: No Joint: No Bone: No None N/A  N/A Epithelialization: Excoriation: No N/A N/A Periwound Skin Texture: Induration: No Callus: No Crepitus: No Rash: No Scarring: No Maceration: No N/A N/A Periwound Skin Moisture: Dry/Scaly: No Atrophie Blanche: No N/A N/A Periwound Skin Color: Cyanosis: No Ecchymosis: No Erythema: No Hemosiderin Staining: No Mottled: No Pallor: No Rubor: No Treatment Notes Electronic Signature(s) Signed: 03/05/2023 8:49:18 AM By: [name] DO Entered By: [name] on 03/05/2023 08:45:30 -------------------------------------------------------------------------------- Multi-Disciplinary Care Plan Details Patient Name: Date of Service: [name]. 03/05/2023 8:00 A M Medical Record Number: [id number] Patient Account Number: [id number] Date of Birth/Sex: Treating  RN: [birth year] (67 y.o. Dianna Limbo Primary Care : Jethro Bastos Other Clinician: Referring : Treating /Extender: Vista Lawman in Treatment: 2 Active Inactive Orientation to the Wound Care Program Nursing Diagnoses: Knowledge deficit related to the wound healing center program Goals: Patient/caregiver will verbalize understanding of the Wound Healing Center Program Date Initiated: 02/13/2023 Target Resolution Date: 05/01/2023 Goal Status: Active Interventions: Provide education on orientation to the wound center Notes: Pain, Acute or Chronic Nursing Diagnoses: Pain, acute or chronic: actual or potential Potential alteration in comfort, pain Goals: Patient will verbalize adequate pain control and receive pain control interventions during procedures as needed Date Initiated: 02/13/2023 Target Resolution Date: 05/02/2023 [name], [name] ([id number]) [phone].pdf Page 5 of 8 Goal Status: Active Interventions: Encourage patient to take pain medications as prescribed Provide education on pain  management Notes: Wound/Skin Impairment Nursing Diagnoses: Knowledge deficit related to ulceration/compromised skin integrity Goals: Patient/caregiver will verbalize understanding of skin care regimen Date Initiated: 02/13/2023 Target Resolution Date: 05/02/2023 Goal Status: Active Interventions: Assess patient/caregiver ability to perform ulcer/skin care regimen upon admission and as needed Assess ulceration(s) every visit Provide education on ulcer and skin care Treatment Activities: Skin care regimen initiated : 02/13/2023 Topical wound management initiated : 02/13/2023 Notes: Electronic Signature(s) Signed: 03/05/2023 2:20:01 PM By: Karie Schwalbe RN Entered By: Karie Schwalbe on 03/05/2023 09:02:29 -------------------------------------------------------------------------------- Pain Assessment Details Patient Name: Date of Service: Gary Perch W. 03/05/2023 8:00 A M Medical Record Number: [id number] Patient Account Number: [id number] Date of Birth/Sex: Treating RN: 1956/05/03 (67 y.o. Dianna Limbo Primary Care : Jethro Bastos Other Clinician: Referring : Treating /Extender: Vista Lawman in Treatment: 2 Active Problems Location of Pain Severity and Description of Pain Patient Has Paino No Site Locations Pain Management and Medication Current Pain Management: [name], [name] ([id number]) [phone].pdf Page 6 of 8 Electronic Signature(s) Signed: 03/05/2023 2:20:01 PM By: Karie Schwalbe RN Entered By: Karie Schwalbe on 03/05/2023 08:12:59 -------------------------------------------------------------------------------- Patient/Caregiver Education Details Patient Name: Date of Service: [name] 8/12/2024andnbsp8:00 A M Medical Record Number: [id number] Patient Account Number: [id number] Date of Birth/Gender: Treating RN: 30-Jul-1955 (67 y.o. Dianna Limbo Primary  Care Physician: Jethro Bastos Other Clinician: Referring Physician: Treating Physician/Extender: Vista Lawman in Treatment: 2 Education Assessment Education Provided To: Patient Education Topics Provided Wound/Skin Impairment: Methods: Explain/Verbal Responses: Return demonstration correctly Electronic Signature(s) Signed: 03/05/2023 2:20:01 PM By: Karie Schwalbe RN Entered By: Karie Schwalbe on 03/05/2023 09:02:43 -------------------------------------------------------------------------------- Wound Assessment Details Patient Name: Date of Service: [name]. 03/05/2023 8:00 A M Medical Record Number: [id number] Patient Account Number: [id number] Date of Birth/Sex: Treating RN: [birth year] (67 y.o. Dianna Limbo Primary Care : Jethro Bastos Other Clinician: Referring : Treating /Extender: Oren Bracket Weeks in Treatment: 2 Wound Status Wound Number: 4 Primary Open Surgical Wound Etiology: Wound Location: Right Groin Wound Open Wounding Event: Bump Status: Date Acquired: 12/25/2022 Comorbid Glaucoma, Anemia, Sleep Apnea, Deep Vein Thrombosis, Type Weeks Of Treatment: 2 History: II Diabetes, Paraplegia Clustered Wound: No Wound under treatment by  outside of Wound Center Photos [name] ([id number]) [phone].pdf Page 7 of 8 Wound Measurements Length: (cm) 5.7 Width: (cm) 2.5 Depth: (cm) 1.5 Area: (cm) 11.192 Volume: (cm) 16.788 % Reduction in Area: -9.6% % Reduction in Volume: 45.2% Epithelialization: None Tunneling: No Undermining: No Wound Description Classification: Full Thickness Without Exposed Support Structures Wound Margin:  Epibole Exudate Amount: Medium Exudate Type: Serosanguineous Exudate Color: red, brown Foul Odor After Cleansing: No Slough/Fibrino No Wound Bed Granulation Amount: Large (67-100%) Exposed  Structure Granulation Quality: Red Fascia Exposed: No Necrotic Amount: None Present (0%) Fat Layer (Subcutaneous Tissue) Exposed: Yes Tendon Exposed: No Muscle Exposed: No Joint Exposed: No Bone Exposed: No Periwound Skin Texture Texture Color No Abnormalities Noted: No No Abnormalities Noted: No Callus: No Atrophie Blanche: No Crepitus: No Cyanosis: No Excoriation: No Ecchymosis: No Induration: No Erythema: No Rash: No Hemosiderin Staining: No Scarring: No Mottled: No Pallor: No Moisture Rubor: No No Abnormalities Noted: No Dry / Scaly: No Maceration: No Treatment Notes Wound #4 (Groin) Wound Laterality: Right Cleanser Wound Cleanser Discharge Instruction: Cleanse the wound with wound cleanser prior to applying a clean dressing using gauze sponges, not tissue or cotton balls. Peri-Wound Care Topical Primary Dressing Vashe wet to dry Discharge Instruction: apply directly to wound bed. lightly pack. Secondary Dressing ABD Pad, 5x9 Discharge Instruction: Apply over primary dressing as directed. Secured With [employer] Compression Stockings [name], [name] ([id number]) [id number] Page 8 of 8 Add-Ons Electronic Signature(s) Signed: 03/05/2023 2:20:01 PM By: Karie Schwalbe RN Entered By: Karie Schwalbe on 03/05/2023 08:31:41 -------------------------------------------------------------------------------- Vitals Details Patient Name: Date of Service: [name]. 03/05/2023 8:00 A M Medical Record Number: [id number] Patient Account Number: [id number] Date of Birth/Sex: Treating RN: 10/13/1955 (67 y.o. M) Primary Care : Jethro Bastos Other Clinician: Referring : Treating /Extender: Vista Lawman in Treatment: 2 Vital Signs Time Taken: 08:12 Temperature (F): 98.3 Height (in): 68 Pulse (bpm): 110 Weight (lbs): 230 Respiratory Rate (breaths/min): 18 Body Mass  Index (BMI): 35 Blood Pressure (mmHg): 138/82 Reference Range: 80 - 120 mg / dl Electronic Signature(s) Signed: 03/05/2023 8:36:14 AM By: Dayton Scrape Entered By: Dayton Scrape on 03/05/2023 08:13:00

## 2023-03-09 ENCOUNTER — Other Ambulatory Visit: Payer: Medicare (Managed Care)

## 2023-03-09 ENCOUNTER — Ambulatory Visit: Payer: Medicare (Managed Care)

## 2023-03-16 ENCOUNTER — Inpatient Hospital Stay: Payer: Medicare (Managed Care)

## 2023-03-16 VITALS — BP 134/72 | HR 76 | Temp 97.7°F | Resp 16

## 2023-03-16 DIAGNOSIS — C9002 Multiple myeloma in relapse: Secondary | ICD-10-CM

## 2023-03-16 DIAGNOSIS — Z5111 Encounter for antineoplastic chemotherapy: Secondary | ICD-10-CM | POA: Diagnosis not present

## 2023-03-16 LAB — CBC WITH DIFFERENTIAL (CANCER CENTER ONLY)
Abs Immature Granulocytes: 0.13 10*3/uL — ABNORMAL HIGH (ref 0.00–0.07)
Basophils Absolute: 0.1 10*3/uL (ref 0.0–0.1)
Basophils Relative: 1 %
Eosinophils Absolute: 0.5 10*3/uL (ref 0.0–0.5)
Eosinophils Relative: 6 %
HCT: 30.1 % — ABNORMAL LOW (ref 39.0–52.0)
Hemoglobin: 10.3 g/dL — ABNORMAL LOW (ref 13.0–17.0)
Immature Granulocytes: 2 %
Lymphocytes Relative: 13 %
Lymphs Abs: 1.1 10*3/uL (ref 0.7–4.0)
MCH: 25.4 pg — ABNORMAL LOW (ref 26.0–34.0)
MCHC: 34.2 g/dL (ref 30.0–36.0)
MCV: 74.3 fL — ABNORMAL LOW (ref 80.0–100.0)
Monocytes Absolute: 0.7 10*3/uL (ref 0.1–1.0)
Monocytes Relative: 8 %
Neutro Abs: 6.2 10*3/uL (ref 1.7–7.7)
Neutrophils Relative %: 70 %
Platelet Count: 515 10*3/uL — ABNORMAL HIGH (ref 150–400)
RBC: 4.05 MIL/uL — ABNORMAL LOW (ref 4.22–5.81)
RDW: 20.2 % — ABNORMAL HIGH (ref 11.5–15.5)
WBC Count: 8.7 10*3/uL (ref 4.0–10.5)
nRBC: 0 % (ref 0.0–0.2)

## 2023-03-16 LAB — CMP (CANCER CENTER ONLY)
ALT: 20 U/L (ref 0–44)
AST: 13 U/L — ABNORMAL LOW (ref 15–41)
Albumin: 3.5 g/dL (ref 3.5–5.0)
Alkaline Phosphatase: 98 U/L (ref 38–126)
Anion gap: 6 (ref 5–15)
BUN: 10 mg/dL (ref 8–23)
CO2: 28 mmol/L (ref 22–32)
Calcium: 8.7 mg/dL — ABNORMAL LOW (ref 8.9–10.3)
Chloride: 109 mmol/L (ref 98–111)
Creatinine: 0.68 mg/dL (ref 0.61–1.24)
GFR, Estimated: >60 mL/min (ref >60)
Glucose, Bld: 114 mg/dL — ABNORMAL HIGH (ref 70–99)
Potassium: 3.7 mmol/L (ref 3.5–5.1)
Sodium: 143 mmol/L (ref 135–145)
Total Bilirubin: 0.3 mg/dL (ref 0.3–1.2)
Total Protein: 6.4 g/dL — ABNORMAL LOW (ref 6.5–8.1)

## 2023-03-16 MED ORDER — SODIUM CHLORIDE 0.9 % IV SOLN: Freq: Once | INTRAVENOUS | Status: AC

## 2023-03-16 MED ORDER — DEXTROSE 5 % IV SOLN
70.0000 mg/m2 | Freq: Once | INTRAVENOUS | Status: AC
  Administered 2023-03-16: 150 mg via INTRAVENOUS
  Filled 2023-03-16: qty 60

## 2023-03-16 MED ORDER — SODIUM CHLORIDE 0.9 % IV SOLN
40.0000 mg | Freq: Once | INTRAVENOUS | Status: AC
  Administered 2023-03-16: 40 mg via INTRAVENOUS
  Filled 2023-03-16: qty 4

## 2023-03-16 NOTE — Patient Instructions (Signed)
[facility]  Discharge Instructions: Thank you for choosing [facility] to provide your oncology and hematology care.   If you have a lab appointment with the Cancer Center, please go directly to the Cancer Center and check in at the registration area.   Wear comfortable clothing and clothing appropriate for easy access to any Portacath or PICC line.   We strive to give you quality time with your provider. You may need to reschedule your appointment if you arrive late (15 or more minutes).  Arriving late affects you and other patients whose appointments are after yours.  Also, if you miss three or more appointments without notifying the office, you may be dismissed from the clinic at the provider's discretion.      For prescription refill requests, have your pharmacy contact our office and allow 72 hours for refills to be completed.    Today you received the following chemotherapy and/or immunotherapy agents :  Kyprolis   To help prevent nausea and vomiting after your treatment, we encourage you to take your nausea medication as directed.  BELOW ARE SYMPTOMS THAT SHOULD BE REPORTED IMMEDIATELY: *FEVER GREATER THAN 100.4 F (38 C) OR HIGHER *CHILLS OR SWEATING *NAUSEA AND VOMITING THAT IS NOT CONTROLLED WITH YOUR NAUSEA MEDICATION *UNUSUAL SHORTNESS OF BREATH *UNUSUAL BRUISING OR BLEEDING *URINARY PROBLEMS (pain or burning when urinating, or frequent urination) *BOWEL PROBLEMS (unusual diarrhea, constipation, pain near the anus) TENDERNESS IN MOUTH AND THROAT WITH OR WITHOUT PRESENCE OF ULCERS (sore throat, sores in mouth, or a toothache) UNUSUAL RASH, SWELLING OR PAIN  UNUSUAL VAGINAL DISCHARGE OR ITCHING   Items with * indicate a potential emergency and should be followed up as soon as possible or go to the Emergency Department if any problems should occur.  Please show the CHEMOTHERAPY ALERT CARD or IMMUNOTHERAPY ALERT CARD at  check-in to the Emergency Department and triage nurse.  Should you have questions after your visit or need to cancel or reschedule your appointment, please contact [facility]  Dept: [phone]  and follow the prompts.  Office hours are 8:00 a.m. to 4:30 p.m. Monday - Friday. Please note that voicemails left after 4:00 p.m. may not be returned until the following business day.  We are closed weekends and major holidays. You have access to a nurse at all times for urgent questions. Please call the main number to the clinic Dept: [phone] and follow the prompts.   For any non-urgent questions, you may also contact your provider using MyChart. We now offer e-Visits for anyone 18 and older to request care online for non-urgent symptoms. For details visit [email or web address].   Also download the MyChart app! Go to the app store, search "MyChart", open the app, select [facility], and log in with your MyChart username and password.   

## 2023-03-19 ENCOUNTER — Encounter (HOSPITAL_BASED_OUTPATIENT_CLINIC_OR_DEPARTMENT_OTHER): Payer: Medicare (Managed Care) | Admitting: Internal Medicine

## 2023-03-19 ENCOUNTER — Ambulatory Visit (HOSPITAL_BASED_OUTPATIENT_CLINIC_OR_DEPARTMENT_OTHER): Payer: Medicare (Managed Care) | Admitting: Internal Medicine

## 2023-03-19 DIAGNOSIS — S31501A Unspecified open wound of unspecified external genital organs, male, initial encounter: Secondary | ICD-10-CM | POA: Diagnosis not present

## 2023-03-19 DIAGNOSIS — N493 Fournier gangrene: Secondary | ICD-10-CM | POA: Diagnosis not present

## 2023-03-19 DIAGNOSIS — L98492 Non-pressure chronic ulcer of skin of other sites with fat layer exposed: Secondary | ICD-10-CM | POA: Diagnosis not present

## 2023-03-19 DIAGNOSIS — G8220 Paraplegia, unspecified: Secondary | ICD-10-CM

## 2023-03-19 DIAGNOSIS — E1152 Type 2 diabetes mellitus with diabetic peripheral angiopathy with gangrene: Secondary | ICD-10-CM | POA: Diagnosis not present

## 2023-03-19 LAB — KAPPA/LAMBDA LIGHT CHAINS
Kappa free light chain: 21.2 mg/L — ABNORMAL HIGH (ref 3.3–19.4)
Kappa, lambda light chain ratio: 1.62 (ref 0.26–1.65)
Lambda free light chains: 13.1 mg/L (ref 5.7–26.3)

## 2023-03-19 NOTE — Progress Notes (Signed)
HARVARD, QUATTROCHI ([id number]) [id number] Page 1 of 7 Visit Report for 03/19/2023 Chief Complaint Document Details Patient Name: Date of Service: [name]. 03/19/2023 8:00 A M Medical Record Number: [id number] Patient Account Number: [id number] Date of Birth/Sex: Treating RN: 26-Apr-1956 (67 y.o. M) Primary Care Provider: Jethro Bastos Other Clinician: Referring Provider: Treating Provider/Extender: Vista Lawman in Treatment: 4 Information Obtained from: Patient Chief Complaint 02/13/2023; right groin wound Electronic Signature(s) Signed: 03/19/2023 1:35:31 PM By: [name] DO Entered By: [name] on 03/19/2023 06:15:11 -------------------------------------------------------------------------------- HPI Details Patient Name: Date of Service: [name]. 03/19/2023 8:00 A M Medical Record Number: [id number] Patient Account Number: [id number] Date of Birth/Sex: Treating RN: 1956-04-15 (67 y.o. M) Primary Care Provider: Jethro Bastos Other Clinician: Referring Provider: Treating Provider/Extender: Vista Lawman in Treatment: 4 History of Present Illness HPI Description: 02/13/2023 [name] is a 67 year old male with a past medical history of paraplegia and multiple myeloma that presents to the clinic for a 1-55-month history of wound to his right groin. Patient developed an abscess and required incision and drainage of the scrotum on 6/3 and 6/7 by Dr. Angelica Chessman, urology. He was diagnosed with foreigners gangrene. He completed a course of doxycycline and Augmentin. He followed with infectious disease with no further current recommendations. He currently denies signs of infection. He is using normal saline wet-to-dry dressings. He has no issues or complaints. He still has 5 sutures in place that are not keeping the skin together. 8/12; patient presents for  follow-up. He has been using Vashe wet-to-dry dressings daily. He has no issues or complaints. Wound is smaller. 8/26; patient presents for follow-up. He has been using Vashe wet-to-dry dressings. Wound is smaller. He has no issues or complaints. Electronic Signature(s) Signed: 03/19/2023 1:35:31 PM By: [name] DO Entered By: [name] on 03/19/2023 06:16:51 -------------------------------------------------------------------------------- Physical Exam Details Patient Name: Date of Service: [name]. 03/19/2023 8:00 A RENLEY, BAROUSSE ([id number]) [id number] Page 2 of 7 Medical Record Number: [id number] Patient Account Number: [id number] Date of Birth/Sex: Treating RN: Feb 27, 1956 (67 y.o. M) Primary Care Provider: Jethro Bastos Other Clinician: Referring Provider: Treating Provider/Extender: Vista Lawman in Treatment: 4 Constitutional respirations regular, non-labored and within target range for patient.Marland Kitchen Psychiatric pleasant and cooperative. Notes T the right groin there is an open wound with granulation tissue throughout. No signs of surrounding infection including increased warmth, erythema or purulent o drainage. Electronic Signature(s) Signed: 03/19/2023 1:35:31 PM By: [name] DO Entered By: [name] on 03/19/2023 06:17:16 -------------------------------------------------------------------------------- Physician Orders Details Patient Name: Date of Service: [name]. 03/19/2023 8:00 A M Medical Record Number: [id number] Patient Account Number: [id number] Date of Birth/Sex: Treating RN: 07-29-1955 (67 y.o. Yates Decamp Primary Care Provider: Jethro Bastos Other Clinician: Referring Provider: Treating Provider/Extender: Vista Lawman in Treatment: 4 Verbal / Phone Orders: No Diagnosis Coding Follow-up Appointments Return  appointment in 1 month. - +++ HOYER++++Extra TIME 60 minutes Dr. Leanord Hawking 0800 room 8 04/16/2023 Bathing/ Shower/ Hygiene May shower with protection but do not get wound dressing(s) wet. Protect dressing(s) with water repellant cover (for example, large plastic bag) or a cast cover and may then take shower. Additional Orders / Instructions Other: - keep groin area clean and dry. Home Health No change in wound care orders this week; continue Home Health  for wound care. May utilize formulary equivalent dressing for wound treatment orders unless otherwise specified. - vashe wet to dry- daily dressing changes. Other Home Health Orders/Instructions: - [facility]. Wound Treatment Wound #4 - Groin Wound Laterality: Right Cleanser: Wound Cleanser (Home Health) 1 x Per Day/30 Days Discharge Instructions: Cleanse the wound with wound cleanser prior to applying a clean dressing using gauze sponges, not tissue or cotton balls. Prim Dressing: Vashe wet to dry (Home Health) 1 x Per Day/30 Days ary Discharge Instructions: apply directly to wound bed. lightly pack. Prim Dressing: 2x2 gauze 1 x Per Day/30 Days ary Discharge Instructions: moisten with vashe Secondary Dressing: ABD Pad, 5x9 (Home Health) 1 x Per Day/30 Days Discharge Instructions: Apply over primary dressing as directed. Electronic Signature(s) Signed: 03/19/2023 1:35:31 PM By: [name] DO Entered By: [name] on 03/19/2023 06:20:05 [name] ([id number]) [id number] Page 3 of 7 -------------------------------------------------------------------------------- Problem List Details Patient Name: Date of Service: [name]. 03/19/2023 8:00 A M Medical Record Number: [id number] Patient Account Number: [id number] Date of Birth/Sex: Treating RN: 1956/06/18 (67 y.o. Yates Decamp Primary Care Provider: Jethro Bastos Other Clinician: Referring Provider: Treating  Provider/Extender: Vista Lawman in Treatment: 4 Active Problems ICD-10 Encounter Code Description Active Date MDM Diagnosis [phone] Non-pressure chronic ulcer of skin of other sites with fat layer exposed 02/13/2023 No Yes S31.501A Unspecified open wound of unspecified external genital organs, male, initial 02/13/2023 No Yes encounter N49.3 Fournier gangrene 02/13/2023 No Yes G82.20 Paraplegia, unspecified 02/13/2023 No Yes Inactive Problems Resolved Problems Electronic Signature(s) Signed: 03/19/2023 1:35:31 PM By: [name] DO Entered By: [name] on 03/19/2023 06:14:45 -------------------------------------------------------------------------------- Progress Note Details Patient Name: Date of Service: Hattie Perch W. 03/19/2023 8:00 A M Medical Record Number: [id number] Patient Account Number: [id number] Date of Birth/Sex: Treating RN: 12-21-1955 (67 y.o. M) Primary Care Provider: Jethro Bastos Other Clinician: Referring Provider: Treating Provider/Extender: Vista Lawman in Treatment: 4 Subjective Chief Complaint Information obtained from Patient 02/13/2023; right groin wound History of Present Illness (HPI) 02/13/2023 Mr. Mckennon Mange Howell is a 67 year old male with a past medical history of paraplegia and multiple myeloma that presents to the clinic for a 1-41-month history BILLE, PALLONE ([id number]) [id number] Page 4 of 7 of wound to his right groin. Patient developed an abscess and required incision and drainage of the scrotum on 6/3 and 6/7 by Dr. Angelica Chessman, urology. He was diagnosed with foreigners gangrene. He completed a course of doxycycline and Augmentin. He followed with infectious disease with no further current recommendations. He currently denies signs of infection. He is using normal saline wet-to-dry dressings. He has no issues or complaints. He still has  5 sutures in place that are not keeping the skin together. 8/12; patient presents for follow-up. He has been using Vashe wet-to-dry dressings daily. He has no issues or complaints. Wound is smaller. 8/26; patient presents for follow-up. He has been using Vashe wet-to-dry dressings. Wound is smaller. He has no issues or complaints. Patient History Family History Cancer - Mother, Diabetes - Father. Social History Never smoker, Marital Status - Married, Alcohol Use - Never, Drug Use - No History, Caffeine Use - Never. Medical History Eyes Patient has history of Glaucoma Hematologic/Lymphatic Patient has history of Anemia Respiratory Patient has history of Sleep Apnea Cardiovascular Patient has history of Deep Vein Thrombosis - xarelto Endocrine Patient has history of Type II Diabetes Neurologic Patient  has history of Paraplegia Hospitalization/Surgery History - 12/29/2022 scrotal exploration. - 12/25/2022 IandD abscess. - 06/2011 colostomy. - 03/2013 suprapubic catheter. Medical A Surgical History Notes nd Constitutional Symptoms (General Health) adrenal insufficiency Gastrointestinal neurogenic bowel, SBO Genitourinary neurogenic bladder Oncologic Multiple myeloma Objective Constitutional respirations regular, non-labored and within target range for patient.. Vitals Time Taken: 8:10 AM, Height: 68 in, Weight: 230 lbs, BMI: 35, Temperature: 97.9 F, Pulse: 77 bpm, Respiratory Rate: 18 breaths/min, Blood Pressure: 153/83 mmHg. Psychiatric pleasant and cooperative. General Notes: T the right groin there is an open wound with granulation tissue throughout. No signs of surrounding infection including increased warmth, o erythema or purulent drainage. Integumentary (Hair, Skin) Wound #4 status is Open. Original cause of wound was Bump. The date acquired was: 12/25/2022. The wound has been in treatment 4 weeks. The wound is located on the Right Groin. The wound measures 5cm length x 1cm  width x 0.7cm depth; 3.927cm^2 area and 2.749cm^3 volume. There is Fat Layer (Subcutaneous Tissue) exposed. There is no tunneling or undermining noted. There is a medium amount of serosanguineous drainage noted. The wound margin is epibole. There is large (67-100%) red granulation within the wound bed. There is no necrotic tissue within the wound bed. The periwound skin appearance did not exhibit: Callus, Crepitus, Excoriation, Induration, Rash, Scarring, Dry/Scaly, Maceration, Atrophie Blanche, Cyanosis, Ecchymosis, Hemosiderin Staining, Mottled, Pallor, Rubor, Erythema. Assessment Active Problems ICD-10 Non-pressure chronic ulcer of skin of other sites with fat layer exposed Unspecified open wound of unspecified external genital organs, male, initial encounter Fournier gangrene Paraplegia, unspecified ESAM, GOECKNER ([id number]) [id number] Page 5 of 7 Patient's wound has shown improvement in size in appearance since last clinic visit. I recommended continuing Vashe wet-to-dry dressings. He knows to call with any questions or concerns. Follow-up in 4 weeks and I am hopeful the wound will be healed by then. Plan Follow-up Appointments: Return appointment in 1 month. - +++ HOYER++++Extra TIME 60 minutes Dr. Leanord Hawking 0800 room 8 04/16/2023 Bathing/ Shower/ Hygiene: May shower with protection but do not get wound dressing(s) wet. Protect dressing(s) with water repellant cover (for example, large plastic bag) or a cast cover and may then take shower. Additional Orders / Instructions: Other: - keep groin area clean and dry. Home Health: No change in wound care orders this week; continue Home Health for wound care. May utilize formulary equivalent dressing for wound treatment orders unless otherwise specified. - vashe wet to dry- daily dressing changes. Other Home Health Orders/Instructions: - [facility]. WOUND #4: - Groin Wound Laterality: Right Cleanser:  Wound Cleanser (Home Health) 1 x Per Day/30 Days Discharge Instructions: Cleanse the wound with wound cleanser prior to applying a clean dressing using gauze sponges, not tissue or cotton balls. Prim Dressing: Vashe wet to dry (Home Health) 1 x Per Day/30 Days ary Discharge Instructions: apply directly to wound bed. lightly pack. Prim Dressing: 2x2 gauze 1 x Per Day/30 Days ary Discharge Instructions: moisten with vashe Secondary Dressing: ABD Pad, 5x9 (Home Health) 1 x Per Day/30 Days Discharge Instructions: Apply over primary dressing as directed. 1. Vashe wet-to-dry dressings 2. Follow-up in 4 weeks Electronic Signature(s) Signed: 03/19/2023 1:35:31 PM By: [name] DO Entered By: [name] on 03/19/2023 06:22:03 -------------------------------------------------------------------------------- HxROS Details Patient Name: Date of Service: Barbie Banner, DO Lajoyce Lauber. 03/19/2023 8:00 A M Medical Record Number: [id number] Patient Account Number: [id number] Date of Birth/Sex: Treating RN: 07-23-1956 (67 y.o. M) Primary Care Provider: Jethro Bastos Other Clinician: Referring Provider:  Treating Provider/Extender: Oren Bracket Weeks in Treatment: 4 Constitutional Symptoms (General Health) Medical History: Past Medical History Notes: adrenal insufficiency Eyes Medical History: Positive for: Glaucoma Hematologic/Lymphatic Medical History: Positive for: Anemia Respiratory Medical History: Positive for: Sleep Apnea OIVA, BARROWMAN ([id number]) [id number] Page 6 of 7 Cardiovascular Medical History: Positive for: Deep Vein Thrombosis - xarelto Gastrointestinal Medical History: Past Medical History Notes: neurogenic bowel, SBO Endocrine Medical History: Positive for: Type II Diabetes Genitourinary Medical History: Past Medical History Notes: neurogenic bladder Neurologic Medical History: Positive for:  Paraplegia Oncologic Medical History: Past Medical History Notes: Multiple myeloma HBO Extended History Items Eyes: Glaucoma Immunizations Pneumococcal Vaccine: Received Pneumococcal Vaccination: No Implantable Devices No devices added Hospitalization / Surgery History Type of Hospitalization/Surgery 12/29/2022 scrotal exploration 12/25/2022 IandD abscess 06/2011 colostomy 03/2013 suprapubic catheter Family and Social History Cancer: Yes - Mother; Diabetes: Yes - Father; Never smoker; Marital Status - Married; Alcohol Use: Never; Drug Use: No History; Caffeine Use: Never; Financial Concerns: No; Food, Clothing or Shelter Needs: No; Support System Lacking: No; Transportation Concerns: No Electronic Signature(s) Signed: 03/19/2023 1:35:31 PM By: [name] DO Entered By: [name] on 03/19/2023 06:16:56 -------------------------------------------------------------------------------- SuperBill Details Patient Name: Date of Service: [name]. 03/19/2023 Medical Record Number: [id number] Patient Account Number: [id number] Date of Birth/Sex: Treating RN: [birth year] (67 y.o. Tammy Sours Primary Care Provider: Jethro Bastos Other Clinician: Referring Provider: Treating Provider/Extender: Alius, Laurance, [place] ([id number]) [id number] Page 7 of 7 Weeks in Treatment: 4 Diagnosis Coding ICD-10 Codes Code Description L98.492 Non-pressure chronic ulcer of skin of other sites with fat layer exposed S31.501A Unspecified open wound of unspecified external genital organs, male, initial encounter N49.3 Fournier gangrene G82.20 Paraplegia, unspecified Facility Procedures : CPT4 Code: [id number] Description: 99213 - WOUND CARE VISIT-LEV 3 EST PT Modifier: Quantity: 1 Physician Procedures : CPT4 Code Description Modifier [id number] - WC PHYS LEVEL 3 - EST PT ICD-10 Diagnosis Description L98.492  Non-pressure chronic ulcer of skin of other sites with fat layer exposed S31.501A Unspecified open wound of unspecified external genital  organs, male, initial encounter N49.3 Fournier gangrene G82.20 Paraplegia, unspecified Quantity: 1 Electronic Signature(s) Signed: 03/19/2023 1:35:31 PM By: [name] DO Entered By: [name] on 03/19/2023 06:22:27

## 2023-03-21 NOTE — Progress Notes (Signed)
[facility] OFFICE PROGRESS NOTE  Jethro Bastos, MD [address] [place] [address]  DIAGNOSIS: IgG Lambda Multiple Myeloma, Relapsed (ISS Stage II)   1) 06/2010: initial diagnosis of Multiple Myeloma after T8 compression fracture. Treated with Velcade/Revlimid/Dexamethasone and achieved a complete remission 2) Velcade was discontinued in September 2012 and that Revlimid and Decadron were discontinued in March 2013. 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 4) Followed by Dr. Bertis Ruddy, last clinic visit 10/09/2019. At that time there was concern for relapse of his multiple myeloma.  5) Patient requested transfer to different provider after misunderstanding regarding imaging studies 6) 12/17/2019: transfer care to Dr. Leonides Schanz  7) 01/09/2020: Cycle 1 Day 1 of Dara/Velcade/Dex 8) 01/21/2020: presented as urgent visit for diarrhea and dehydration. Holding chemotherapy scheduled for 01/23/2020. 9) 01/30/2020: Resume dara/velcade/dex after resolution of diarrhea.  10) 02/13/2020: restaging labs show M protein 0.8, Kappa 4.5, lamba 17.2, ratio 0.26, urine M protein 53 (7.1%). All MM labs indicate improvement.  11) 03/10/2020: Cycle 4 Day 1 of Dara/Velcade/Dex. Transition to q 3 week daratumumab.  12) 06/04/2020:  Cycle 8 Day 1 of Dara/Velcade/Dex 13) 06/25/2020:  Cycle 9 Day 1 of Dara/Velcade/Dex 14) 07/22/2020: Cycle 10 Day 1 of  Dara//Dex 15) 08/18/2020: Cycle 11 Day 1 of Dara//Dex 16) 09/23/2020: Cycle 12 Day 1 of Dara//Dex 17) 10/22/2020: Cycle 13 Day 1 of Dara/Dex  18) 11/19/2020: Cycle 14 Day 1 of Dara/Dex  19) 12/17/2020: Cycle 15 Day 1 of Dara/Dex  20) 01/17/2021: Cycle 16 Day 1 of Dara/Dex  21) 02/11/2021: Cycle 17 Day 1 of Dara/Dex 22) 03/10/2021: Cycle 18 Day 1 of Dara/Dex  23) 04/08/2021: Cycle 19 Day 1 of Dara/Dex  24) 08/03/2021: Cycle 20 Day 1 of Dara/Dex (delayed due to  scheduling error) 25) 09/02/2021: Cycle 21 Day 1 of Dara/Dex 26) 09/30/2021: Cycle 22 Day 1 of Dara/Dex 27) 10/28/2021: Cycle 23 Day 1 of Dara/Dex 28) 12/02/2021: Cycle 24 Day 1 of Dara/Dex 29) 12/30/2021: Cycle 25 Day 1 of Dara/Dex 30) 01/06/2022: Cycle 1 Day 1 of Kyprolis/Dex 31) 02/03/2022: Cycle 2 Day 1 of Kyprolis/Dex 32) 02/24/2022: Cycle 3 Day 1 of Kyprolis/Dex 33) 04/07/2022: Cycle 4 Day 1 of Kyprolis/Dex 34) 05/05/2022: Cycle 5 Day 1 of Kyprolis/Dex 35) 06/01/2022: Cycle 6 Day 1 of Kyprolis/Dex 36) 06/29/2022: Cycle 7 Day 1 of Kyprolis/Dex 37) 07/28/2022: Cycle 8 Day 1 of Kyprolis/Dex 38) 08/25/2022: Cycle 9 Day 1 of Kyprolis/Dex 39) 09/22/2022: Cycle 10 Day 1 of Kyprolis/Dex 40) 10/20/2022: Cycle 11 Day 1 of Kyprolis/Dex 41) 11/17/2022: Cycle 12 Day 1 of Kyprolis/Dex 42 ) 12/22/2022-01/08/2023: Admitted for fournier's gangrene of scrotum.  43) 01/26/2023: Cycle 13 Day 1 of Kyprolis/Dex 44) 02/16/2023: Cycle 14 Day 1 of Kyprolis/Dex 45) 03/23/2023: Cycle #15 Day 8 of Kyprolis/Dex  CURRENT THERAPY: Cycle #15 Day 8 of Kyprolis/Dex  INTERVAL HISTORY: [name] 67 y.o. male returns to the clinic today for a follow up visit. The patient was last seen in the clinic by my colleague, Karena Addison, on 02/16/23.   The patient is undergoing treatment with Kyprolis and decadron. He is tolerating this well. He denies any major changes in his health. He mentions he got a CPAP machine and is having a hard time adjusting to it because it causes a dry mouth and leaves an imprint on his nose. He denies any fevers, chills, night sweats, or weight loss. Denies lymphadenopathy. Denies  signs of infections such as sore throat, nasal congestion, cough, shortness of breath, or dysuria. He has a wound that he is undergoing wound care with [facility]. He reports the wound is getting smaller. His wife or [facility] dresses his wound daily. He started an iron supplement recently and is tolerating this well. He also has been trying to  increase his protein intake to help with wound healing. He denies nausea, vomiting, diarrhea, or constipation. He has colostomy bag. He may have loose stool based on what he eats, but not often. His stools are normal at this  Denies urinary changes. Denies abnormal bleeding or bruising. Denies pain. Denies abdominal pain. He is here for evaluation and repeat blood work before undergoing cycle #14.   MEDICAL HISTORY: Past Medical History:  Diagnosis Date   Adrenal insufficiency (HCC)    on chronic dexamethasone   Anemia    Cancer (HCC)    Coagulopathy (HCC)    on xeralto/ s/p DVT while on coumadin,  IVC in place   Diabetes mellitus without complication (HCC)    type 2   Glaucoma 12/22/2022   Gross hematuria 01/2013   post foley cath procedure   History of blood transfusion 01/2013   Lower GI bleeding 04/18/2017   Multiple myeloma    thoracic T8 with paraplegia s/p resection- on chemo at visit 10/13/10   Multiple myeloma    Multiple myeloma without mention of remission    Neurogenic bladder    Neurogenic bowel    OSA (obstructive sleep apnea) 11/01/2022   Paraplegia (HCC)    Partial small bowel obstruction (HCC) during dec 2011 admission    ALLERGIES:  is allergic to ferumoxytol.  MEDICATIONS:  Current Outpatient Medications  Medication Sig Dispense Refill   acetaminophen (TYLENOL) 325 MG tablet Take 325-650 mg by mouth every 6 (six) hours as needed for headache or mild pain.     acetic acid 0.25 % irrigation Irrigate with 1 Application as directed See admin instructions. Instill 50 ml's, clamp for 10 minutes, then remove clamp and drain the bladder. Repeat 3 times a week- when not using the Renacidin irrigation     acyclovir (ZOVIRAX) 400 MG tablet Take 1 tablet (400 mg total) by mouth 2 (two) times daily. 60 tablet 2   albuterol (VENTOLIN HFA) 108 (90 Base) MCG/ACT inhaler Inhale 2 puffs into the lungs every 4 (four) hours as needed for wheezing or shortness of breath.      atorvastatin (LIPITOR) 20 MG tablet Take 20 mg by mouth daily.     BENADRYL ALLERGY 25 MG tablet Take 25 mg by mouth every 6 (six) hours as needed (for allergic reactions).     BIOFREEZE 4 % GEL Apply 1 application  topically every 4 (four) hours as needed (for joint pain- to intact areas of the skin only).     Citric Ac-Gluconolact-Mg Carb (RENACIDIN IR) Irrigate with 30 mLs as directed See admin instructions. Instill 30 ml's, clamp for 10 minutes, then remove clamp and drain the bladder. Repeat 3 times a week- when not using the acetic acid irrigation     COSOPT PF 2-0.5 % SOLN Place 1 drop into both eyes 2 (two) times daily.     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     Emollient (EUCERIN EX) Apply 1 application  topically See admin instructions. Apply to affected area 2 times a day  FLOVENT HFA 110 MCG/ACT inhaler 2 puffs See admin instructions. 2 sprays to colostomy stoma prior to each colostomy bag change     iron polysaccharides (NU-IRON) 150 MG capsule Take 1 capsule (150 mg total) by mouth daily. 30 capsule 0   latanoprost (XALATAN) 0.005 % ophthalmic solution Place 1 drop into both eyes at bedtime.     Multiple Vitamin (MULTIVITAMIN WITH MINERALS) TABS tablet Take 1 tablet by mouth daily. 30 tablet 0   NON FORMULARY Apply 1 application  topically See admin instructions. Colo Plast paste- Apply as directed with each ostomy bag change     ondansetron (ZOFRAN) 4 MG tablet Take 1 tablet (4 mg) by mouth every 6 hours as needed for nausea. 20 tablet 0   rivaroxaban (XARELTO) 10 MG TABS tablet Take 1 tablet (10 mg total) by mouth daily with supper. (Patient taking differently: Take 10 mg by mouth daily.) 30 tablet 9   Skin Protectants, Misc. (MINERIN CREME) CREA Apply to affected areas 2 times daily as needed. 454 g 0   Zinc Oxide (BALMEX EX) Apply 1 application  topically See admin instructions. Apply  topically daily to affected sites     No current facility-administered medications for this visit.   Facility-Administered Medications Ordered in Other Visits  Medication Dose Route Frequency Provider Last Rate Last Admin   sodium chloride flush (NS) 0.9 % injection 10 mL  10 mL Intravenous PRN Artis Delay, MD        SURGICAL HISTORY:  Past Surgical History:  Procedure Laterality Date   COLONOSCOPY WITH PROPOFOL N/A 04/12/2017   Procedure: COLONOSCOPY WITH PROPOFOL;  Surgeon: Hilarie Fredrickson, MD;  Location: [facility];  Service: Endoscopy;  Laterality: N/A;   COLONOSCOPY WITH PROPOFOL N/A 04/19/2017   Procedure: COLONOSCOPY WITH PROPOFOL;  Surgeon: Benancio Deeds, MD;  Location: [facility];  Service: Gastroenterology;  Laterality: N/A;   COLOSTOMY  07/20/2011   Procedure: COLOSTOMY;  Surgeon: Rulon Abide, DO;  Location: [facility] OR;  Service: General;;   COLOSTOMY REVISION  07/20/2011   Procedure: COLON RESECTION SIGMOID;  Surgeon: Rulon Abide, DO;  Location: [facility] OR;  Service: General;;   CYSTOSCOPY N/A 04/04/2013   Procedure: CYSTOSCOPY WITH LITHALOPAXY;  Surgeon: Sebastian Ache, MD;  Location: [facility];  Service: Urology;  Laterality: N/A;   INCISION AND DRAINAGE ABSCESS N/A 12/25/2022   Procedure: INCISION AND DRAINAGE OF SCROTUM;  Surgeon: Despina Arias, MD;  Location: [facility];  Service: Urology;  Laterality: N/A;   INSERTION OF SUPRAPUBIC CATHETER N/A 04/04/2013   Procedure: INSERTION OF SUPRAPUBIC CATHETER;  Surgeon: Sebastian Ache, MD;  Location: [facility];  Service: Urology;  Laterality: N/A;   LAPAROTOMY  07/20/2011   Procedure: EXPLORATORY LAPAROTOMY;  Surgeon: Rulon Abide, DO;  Location: [facility] OR;  Service: General;  Laterality: N/A;   myeloma thoracic T8 with parpaplegia s/p thoracotomy and thoracic T7-9 cage placement on Dec 26th 2011  07/18/10   SCROTAL EXPLORATION N/A 12/29/2022   Procedure: SCROTAL WOUND DEBRIDEMENT AND CLOSURE;  Surgeon: Loletta Parish.,  MD;  Location: [facility];  Service: Urology;  Laterality: N/A;    REVIEW OF SYSTEMS:   Review of Systems  Constitutional: Negative for appetite change, chills, fatigue, fever and unexpected weight change.  HENT:  Negative for mouth sores, nosebleeds, sore throat and trouble swallowing.   Eyes: Negative for eye problems and icterus.  Respiratory: Negative for cough, hemoptysis, shortness of breath and wheezing.   Cardiovascular: Negative for chest pain  and leg swelling.  Gastrointestinal: Negative for abdominal pain, constipation, diarrhea, nausea and vomiting.  Genitourinary: Negative for bladder incontinence, difficulty urinating, dysuria, frequency and hematuria.   Musculoskeletal: Negative for back pain, gait problem, neck pain and neck stiffness.  Skin: Negative for itching and rash.  Neurological: Negative for dizziness, extremity weakness, gait problem, headaches, light-headedness and seizures.  Hematological: Negative for adenopathy. Does not bruise/bleed easily.  Psychiatric/Behavioral: Negative for confusion, depression and sleep disturbance. The patient is not nervous/anxious.     PHYSICAL EXAMINATION:  Blood pressure 128/75, pulse 70, temperature (!) 97.5 F (36.4 C), temperature source Oral, resp. rate 17, height 5\' 8"  (1.727 m), weight 251 lb (113.9 kg), SpO2 100%.  ECOG PERFORMANCE STATUS: Paraplegic  Physical Exam  Constitutional: Oriented to person, place, and time and well-developed, well-nourished, and in no distress. HENT:  Head: Normocephalic and atraumatic.  Mouth/Throat: Oropharynx is clear and moist. No oropharyngeal exudate.  Eyes: Conjunctivae are normal. Right eye exhibits no discharge. Left eye exhibits no discharge. No scleral icterus.  Neck: Normal range of motion. Neck supple.  Cardiovascular: Normal rate, regular rhythm, normal heart sounds and intact distal pulses.   Pulmonary/Chest: Effort normal and breath sounds normal. No respiratory distress. No  wheezes. No rales.  Abdominal: Soft. Bowel sounds are normal. Exhibits no distension and no mass. There is no tenderness. Colostomy bag in place.  Musculoskeletal: Normal range of motion. Exhibits no edema.  Lymphadenopathy:    No cervical adenopathy.  Neurological: Alert and oriented to person, place, and time. paraplegic, no use of LE bilaterally.  Skin: Skin is warm and dry. No rash noted. Not diaphoretic. No erythema. No pallor.  Psychiatric: Mood, memory and judgment normal.  Vitals reviewed.  LABORATORY DATA: Lab Results  Component Value Date   WBC 7.4 03/23/2023   HGB 11.5 (L) 03/23/2023   HCT 34.1 (L) 03/23/2023   MCV 74.3 (L) 03/23/2023   PLT 202 03/23/2023      Chemistry      Component Value Date/Time   NA 143 03/23/2023 0956   NA 141 03/01/2017 1133   K 3.8 03/23/2023 0956   K 4.0 03/01/2017 1133   CL 110 03/23/2023 0956   CL 104 11/15/2012 0823   CO2 28 03/23/2023 0956   CO2 26 03/01/2017 1133   BUN 17 03/23/2023 0956   BUN 13.7 03/01/2017 1133   CREATININE 0.76 03/23/2023 0956   CREATININE 0.8 03/01/2017 1133   GLU 166 (H) 09/19/2010 1538      Component Value Date/Time   CALCIUM 9.5 03/23/2023 0956   CALCIUM 10.1 03/01/2017 1133   ALKPHOS 92 03/23/2023 0956   ALKPHOS 91 03/01/2017 1133   AST 12 (L) 03/23/2023 0956   AST 28 03/01/2017 1133   ALT 15 03/23/2023 0956   ALT 35 03/01/2017 1133   BILITOT 0.5 03/23/2023 0956   BILITOT 0.60 03/01/2017 1133       RADIOGRAPHIC STUDIES:  No results found.   ASSESSMENT/PLAN:  [name] 67 y.o. male with medical history significant for  IgG Lambda Multiple Myeloma who presents for a follow up visit.   # IgG Lambda Multiple Myeloma, Relapsed (ISS Stage II) --findings are most consistent with relapsed multiple myeloma. Patient previously successfully treated with Velcade/Rev/Dex and Daratumumab/Velcade/Dex. On 07/02/2020 he transitioned to monthly daratumumab alone.  --due to to rise in M protein,  switched to Kyprolis and Dexamethasone on 01/06/2022 Plan: --Labs show white blood cell count 7.4, hemoglobin 11.5, MCV 74.3, and platelets of 202, Creatinine  and LFTs are normal  --Most recent myeloma labs from 03/16/23 showed stable M protein of 0.2. Kappa light chain elevated but improved at 21.2, lambda light chain and ratio normal.  --Continue with weekly labs plus Kyprolis/Dex treatment as scheduled. I have released more appointment requests   #Sepsis 2/2 UTI and Fournier's gangrene of scrotum: --Hospitalized from 531/2024-01/08/2023. Underwent debridement of necrotic tissue. --Given PIP tazobactam and Zyvox initially --Urine culture reveals Proteus mirabilis which is resistant to ciprofloxacin and nitrofurantoin --Blood cultures negative --D/C on remainder doxycycline and Augmentin which he completed on 01/07/2023.  --Evaluated by urology on 7/2 and ID on 7/3 with continued wound healing and no further d/c or evidence of infection.  -Patient tells me today the area continues to get smaller. Has wound dressing change daily by [facility]/wife.    #History of DVT --He had placement of IVC filter, remains on Xarelto. --Due to poor mobility, and lack of bleeding complications, recommend to continue on Xarelto indefinitely. --caution if Plt count were to drop <50   #Snoring/Risk for OSA: --Diagnosed with OSA and has CPAP machine.  -He is having a hard time adjusting to this    # Supportive Care -- provided patient with an albuterol inhaler (for use with daratumumab) --acyclovir 400mg  BID for VZV prophylaxis --zofran 8mg  q8H PRN for nausea/vomiting  --Zometa is being held in the setting of his prior episode of osteonecrosis of the jaw   No orders of the defined types were placed in this encounter.   The total time spent in the appointment was 20-29 minutes  Louise Victory L Benjamine Strout, PA-C 03/23/23

## 2023-03-21 NOTE — Progress Notes (Signed)
ARON, WOLFFE ([id number]) [id number] Page 1 of 9 Visit Report for 03/19/2023 Arrival Information Details Patient Name: Date of Service: [name]. 03/19/2023 8:00 A M Medical Record Number: [id number] Patient Account Number: [id number] Date of Birth/Sex: Treating RN: 1956/07/11 (67 y.o. Yates Decamp Primary Care Masae Lukacs: Jethro Bastos Other Clinician: Referring Jamaris Theard: Treating Shawny Borkowski/Extender: Vista Lawman in Treatment: 4 Visit Information History Since Last Visit All ordered tests and consults were completed: Yes Patient Arrived: Wheel Chair Added or deleted any medications: No Arrival Time: 07:59 Any new allergies or adverse reactions: No Accompanied By: self Had a fall or experienced change in No Transfer Assistance: Nurse, adult activities of daily living that may affect Patient Identification Verified: Yes risk of falls: Secondary Verification Process Completed: Yes Signs or symptoms of abuse/neglect since last visito No Patient Requires Transmission-Based Precautions: No Hospitalized since last visit: No Patient Has Alerts: Yes Implantable device outside of the clinic excluding No Patient Alerts: Patient on Blood Thinner cellular tissue based products placed in the center Xarelto since last visit: Has Dressing in Place as Prescribed: Yes Pain Present Now: No Electronic Signature(s) Signed: 03/20/2023 2:02:59 PM By: Brenton Grills Entered By: Brenton Grills on 03/19/2023 08:00:18 -------------------------------------------------------------------------------- Clinic Level of Care Assessment Details Patient Name: Date of Service: [facility]. 03/19/2023 8:00 A M Medical Record Number: [id number] Patient Account Number: [id number] Date of Birth/Sex: Treating RN: 09/14/1955 (67 y.o. Tammy Sours Primary Care Dierdre Mccalip: Jethro Bastos Other Clinician: Referring Dejion Grillo: Treating  Hawthorne Day/Extender: Vista Lawman in Treatment: 4 Clinic Level of Care Assessment Items TOOL 4 Quantity Score X- 1 0 Use when only an EandM is performed on FOLLOW-UP visit ASSESSMENTS - Nursing Assessment / Reassessment X- 1 10 Reassessment of Co-morbidities (includes updates in patient status) X- 1 5 Reassessment of Adherence to Treatment Plan ASSESSMENTS - Wound and Skin A ssessment / Reassessment X - Simple Wound Assessment / Reassessment - one wound 1 5 []  - 0 Complex Wound Assessment / Reassessment - multiple wounds X- 1 10 Dermatologic / Skin Assessment (not related to wound area) ASSESSMENTS - Focused Assessment []  - 0 Circumferential Edema Measurements - multi extremities []  - 0 Nutritional Assessment / Counseling / Intervention DEVLIN, ALKIRE ([id number]) [phone] Page 2 of 9 []  - 0 Lower Extremity Assessment (monofilament, tuning fork, pulses) []  - 0 Peripheral Arterial Disease Assessment (using hand held doppler) ASSESSMENTS - Ostomy and/or Continence Assessment and Care []  - 0 Incontinence Assessment and Management []  - 0 Ostomy Care Assessment and Management (repouching, etc.) PROCESS - Coordination of Care X - Simple Patient / Family Education for ongoing care 1 15 []  - 0 Complex (extensive) Patient / Family Education for ongoing care X- 1 10 Staff obtains Chiropractor, Records, T Results / Process Orders est []  - 0 Staff telephones HHA, Nursing Homes / Clarify orders / etc []  - 0 Routine Transfer to another Facility (non-emergent condition) []  - 0 Routine Hospital Admission (non-emergent condition) []  - 0 New Admissions / Manufacturing engineer / Ordering NPWT Apligraf, etc. , []  - 0 Emergency Hospital Admission (emergent condition) X- 1 10 Simple Discharge Coordination []  - 0 Complex (extensive) Discharge Coordination PROCESS - Special Needs []  - 0 Pediatric / Minor Patient Management []  -  0 Isolation Patient Management []  - 0 Hearing / Language / Visual special needs []  - 0 Assessment of Community assistance (transportation, D/C planning, etc.) []  - 0 Additional  assistance / Altered mentation []  - 0 Support Surface(s) Assessment (bed, cushion, seat, etc.) INTERVENTIONS - Wound Cleansing / Measurement X - Simple Wound Cleansing - one wound 1 5 []  - 0 Complex Wound Cleansing - multiple wounds X- 1 5 Wound Imaging (photographs - any number of wounds) []  - 0 Wound Tracing (instead of photographs) X- 1 5 Simple Wound Measurement - one wound []  - 0 Complex Wound Measurement - multiple wounds INTERVENTIONS - Wound Dressings X - Small Wound Dressing one or multiple wounds 1 10 []  - 0 Medium Wound Dressing one or multiple wounds []  - 0 Large Wound Dressing one or multiple wounds []  - 0 Application of Medications - topical []  - 0 Application of Medications - injection INTERVENTIONS - Miscellaneous []  - 0 External ear exam []  - 0 Specimen Collection (cultures, biopsies, blood, body fluids, etc.) []  - 0 Specimen(s) / Culture(s) sent or taken to Lab for analysis X- 1 10 Patient Transfer (multiple staff / Nurse, adult / Similar devices) []  - 0 Simple Staple / Suture removal (25 or less) []  - 0 Complex Staple / Suture removal (26 or more) []  - 0 Hypo / Hyperglycemic Management (close monitor of Blood Glucose) RECHARD, STFORT ([id number]) [phone].pdf Page 3 of 9 []  - 0 Ankle / Brachial Index (ABI) - do not check if billed separately X- 1 5 Vital Signs Has the patient been seen at the hospital within the last three years: Yes Total Score: 105 Level Of Care: New/Established - Level 3 Electronic Signature(s) Signed: 03/20/2023 5:33:37 PM By: Shawn Stall RN, BSN Entered By: Shawn Stall on 03/19/2023 09:01:35 -------------------------------------------------------------------------------- Encounter Discharge Information Details Patient  Name: Date of Service: [name]. 03/19/2023 8:00 A M Medical Record Number: [id number] Patient Account Number: [id number] Date of Birth/Sex: Treating RN: 1956/05/05 (67 y.o. Tammy Sours Primary Care Letoya Stallone: Jethro Bastos Other Clinician: Referring Chelsey Redondo: Treating Shareef Eddinger/Extender: Vista Lawman in Treatment: 4 Encounter Discharge Information Items Discharge Condition: Stable Ambulatory Status: Wheelchair Discharge Destination: Home Transportation: Private Auto Accompanied By: self Schedule Follow-up Appointment: Yes Clinical Summary of Care: Electronic Signature(s) Signed: 03/20/2023 5:33:37 PM By: Shawn Stall RN, BSN Entered By: Shawn Stall on 03/19/2023 09:03:06 -------------------------------------------------------------------------------- Lower Extremity Assessment Details Patient Name: Date of Service: [name]. 03/19/2023 8:00 A M Medical Record Number: [id number] Patient Account Number: [id number] Date of Birth/Sex: Treating RN: October 22, 1955 (67 y.o. Yates Decamp Primary Care Maryem Shuffler: Jethro Bastos Other Clinician: Referring Jillisa Harris: Treating Juanitta Earnhardt/Extender: Vista Lawman in Treatment: 4 Electronic Signature(s) Signed: 03/20/2023 2:02:59 PM By: Brenton Grills Entered By: Brenton Grills on 03/19/2023 08:19:53 -------------------------------------------------------------------------------- Multi Wound Chart Details Patient Name: Date of Service: [name]. 03/19/2023 8:00 A Quinn Plowman ([id number]) [id number] Page 4 of 9 Medical Record Number: [id number] Patient Account Number: [id number] Date of Birth/Sex: Treating RN: 1956-05-10 (67 y.o. M) Primary Care Valisha Heslin: Jethro Bastos Other Clinician: Referring Kayn Haymore: Treating Tamaira Ciriello/Extender: Vista Lawman in Treatment: 4 Vital  Signs Height(in): 68 Pulse(bpm): 77 Weight(lbs): 230 Blood Pressure(mmHg): 153/83 Body Mass Index(BMI): 35 Temperature(F): 97.9 Respiratory Rate(breaths/min): 18 [4:Photos:] [N/A:N/A] Right Groin N/A N/A Wound Location: Bump N/A N/A Wounding Event: Open Surgical Wound N/A N/A Primary Etiology: Glaucoma, Anemia, Sleep Apnea, N/A N/A Comorbid History: Deep Vein Thrombosis, Type II Diabetes, Paraplegia 12/25/2022 N/A N/A Date Acquired: 4 N/A N/A Weeks of Treatment: Open N/A N/A Wound Status: No  N/A N/A Wound Recurrence: 5x1x0.7 N/A N/A Measurements L x W x D (cm) 3.927 N/A N/A A (cm) : rea 2.749 N/A N/A Volume (cm) : 61.50% N/A N/A % Reduction in Area: 91.00% N/A N/A % Reduction in Volume: Full Thickness Without Exposed N/A N/A Classification: Support Structures Medium N/A N/A Exudate Amount: Serosanguineous N/A N/A Exudate Type: red, brown N/A N/A Exudate Color: Epibole N/A N/A Wound Margin: Large (67-100%) N/A N/A Granulation Amount: Red N/A N/A Granulation Quality: None Present (0%) N/A N/A Necrotic Amount: Fat Layer (Subcutaneous Tissue): Yes N/A N/A Exposed Structures: Fascia: No Tendon: No Muscle: No Joint: No Bone: No None N/A N/A Epithelialization: Excoriation: No N/A N/A Periwound Skin Texture: Induration: No Callus: No Crepitus: No Rash: No Scarring: No Maceration: No N/A N/A Periwound Skin Moisture: Dry/Scaly: No Atrophie Blanche: No N/A N/A Periwound Skin Color: Cyanosis: No Ecchymosis: No Erythema: No Hemosiderin Staining: No Mottled: No Pallor: No Rubor: No Treatment Notes Wound #4 (Groin) Wound Laterality: Right Cleanser Wound Cleanser Discharge Instruction: Cleanse the wound with wound cleanser prior to applying a clean dressing using gauze sponges, not tissue or cotton balls. Peri-Wound Care ADITHYA, ALMAN ([id number]) [id number] Page 5 of 9 Topical Primary Dressing Vashe wet to  dry Discharge Instruction: apply directly to wound bed. lightly pack. 2x2 gauze Discharge Instruction: moisten with vashe Secondary Dressing ABD Pad, 5x9 Discharge Instruction: Apply over primary dressing as directed. Secured With Compression Wrap Compression Stockings Facilities manager) Signed: 03/19/2023 1:35:31 PM By: [name] DO Entered By: [name] on 03/19/2023 09:14:49 -------------------------------------------------------------------------------- Multi-Disciplinary Care Plan Details Patient Name: Date of Service: [name]. 03/19/2023 8:00 A M Medical Record Number: [id number] Patient Account Number: [id number] Date of Birth/Sex: Treating RN: 31-Jan-1956 (67 y.o. Yates Decamp Primary Care Kobie Whidby: Jethro Bastos Other Clinician: Referring Westlyn Glaza: Treating Leeanna Slaby/Extender: Vista Lawman in Treatment: 4 Active Inactive Orientation to the Wound Care Program Nursing Diagnoses: Knowledge deficit related to the wound healing center program Goals: Patient/caregiver will verbalize understanding of the Wound Healing Center Program Date Initiated: 02/13/2023 Target Resolution Date: 05/01/2023 Goal Status: Active Interventions: Provide education on orientation to the wound center Notes: Pain, Acute or Chronic Nursing Diagnoses: Pain, acute or chronic: actual or potential Potential alteration in comfort, pain Goals: Patient will verbalize adequate pain control and receive pain control interventions during procedures as needed Date Initiated: 02/13/2023 Target Resolution Date: 05/02/2023 Goal Status: Active Interventions: Encourage patient to take pain medications as prescribed Provide education on pain management HARSIMRAN, TLASECA ([id number]) [id number] Page 6 of 9 Notes: Wound/Skin Impairment Nursing Diagnoses: Knowledge deficit related to ulceration/compromised skin  integrity Goals: Patient/caregiver will verbalize understanding of skin care regimen Date Initiated: 02/13/2023 Target Resolution Date: 05/02/2023 Goal Status: Active Interventions: Assess patient/caregiver ability to perform ulcer/skin care regimen upon admission and as needed Assess ulceration(s) every visit Provide education on ulcer and skin care Treatment Activities: Skin care regimen initiated : 02/13/2023 Topical wound management initiated : 02/13/2023 Notes: Electronic Signature(s) Signed: 03/20/2023 2:02:59 PM By: Brenton Grills Entered By: Brenton Grills on 03/19/2023 08:28:34 -------------------------------------------------------------------------------- Pain Assessment Details Patient Name: Date of Service: Hattie Perch W. 03/19/2023 8:00 A M Medical Record Number: [id number] Patient Account Number: [id number] Date of Birth/Sex: Treating RN: [birth year] (67 y.o. Yates Decamp Primary Care Chasady Longwell: Jethro Bastos Other Clinician: Referring Melika Reder: Treating Salaya Holtrop/Extender: Vista Lawman in Treatment: 4 Active Problems Location of Pain Severity and Description of Pain  Patient Has Paino No Site Locations Pain Management and Medication Current Pain Management: Electronic Signature(s) Signed: 03/20/2023 2:02:59 PM By: Brenton Grills Entered By: Brenton Grills on 03/19/2023 08:19:41 [name] ([id number]) [id number] Page 7 of 9 -------------------------------------------------------------------------------- Patient/Caregiver Education Details Patient Name: Date of Service: MCCO Y, DO UGLA S W. 8/26/2024andnbsp8:00 A M Medical Record Number: [id number] Patient Account Number: [id number] Date of Birth/Gender: Treating RN: [birth year] (67 y.o. Yates Decamp Primary Care Physician: Jethro Bastos Other Clinician: Referring Physician: Treating Physician/Extender: Vista Lawman in Treatment: 4 Education Assessment Education Provided To: Patient and Caregiver Education Topics Provided Wound/Skin Impairment: Methods: Explain/Verbal Responses: State content correctly Electronic Signature(s) Signed: 03/20/2023 2:02:59 PM By: Brenton Grills Entered By: Brenton Grills on 03/19/2023 08:28:53 -------------------------------------------------------------------------------- Wound Assessment Details Patient Name: Date of Service: [name]. 03/19/2023 8:00 A M Medical Record Number: [id number] Patient Account Number: [id number] Date of Birth/Sex: Treating RN: January 15, 1956 (66 y.o. Yates Decamp Primary Care Madailein Londo: Jethro Bastos Other Clinician: Referring Tia Gelb: Treating Reynald Woods/Extender: Vista Lawman in Treatment: 4 Wound Status Wound Number: 4 Primary Open Surgical Wound Etiology: Wound Location: Right Groin Wound Open Wounding Event: Bump Status: Date Acquired: 12/25/2022 Comorbid Glaucoma, Anemia, Sleep Apnea, Deep Vein Thrombosis, Type Weeks Of Treatment: 4 History: II Diabetes, Paraplegia Clustered Wound: No Wound under treatment by Bernie Fobes outside of Wound Center Photos Wound Measurements GARDINER, SYDOW ([id number]) Length: (cm) 5 Width: (cm) 1 Depth: (cm) 0.7 Area: (cm) 3.927 Volume: (cm) 2.749 [id number] Page 8 of 9 % Reduction in Area: 61.5% % Reduction in Volume: 91% Epithelialization: None Tunneling: No Undermining: No Wound Description Classification: Full Thickness Without Exposed Support Structures Wound Margin: Epibole Exudate Amount: Medium Exudate Type: Serosanguineous Exudate Color: red, brown Foul Odor After Cleansing: No Slough/Fibrino No Wound Bed Granulation Amount: Large (67-100%) Exposed Structure Granulation Quality: Red Fascia Exposed: No Necrotic Amount: None Present (0%) Fat Layer (Subcutaneous Tissue) Exposed:  Yes Tendon Exposed: No Muscle Exposed: No Joint Exposed: No Bone Exposed: No Periwound Skin Texture Texture Color No Abnormalities Noted: No No Abnormalities Noted: No Callus: No Atrophie Blanche: No Crepitus: No Cyanosis: No Excoriation: No Ecchymosis: No Induration: No Erythema: No Rash: No Hemosiderin Staining: No Scarring: No Mottled: No Pallor: No Moisture Rubor: No No Abnormalities Noted: No Dry / Scaly: No Maceration: No Treatment Notes Wound #4 (Groin) Wound Laterality: Right Cleanser Wound Cleanser Discharge Instruction: Cleanse the wound with wound cleanser prior to applying a clean dressing using gauze sponges, not tissue or cotton balls. Peri-Wound Care Topical Primary Dressing Vashe wet to dry Discharge Instruction: apply directly to wound bed. lightly pack. 2x2 gauze Discharge Instruction: moisten with vashe Secondary Dressing ABD Pad, 5x9 Discharge Instruction: Apply over primary dressing as directed. Secured With Compression Wrap Compression Stockings Add-Ons Electronic Signature(s) Signed: 03/20/2023 2:02:59 PM By: Brenton Grills Entered By: Brenton Grills on 03/19/2023 08:25:29 [name] ([id number]) [id number] Page 9 of 9 -------------------------------------------------------------------------------- Vitals Details Patient Name: Date of Service: [name]. 03/19/2023 8:00 A M Medical Record Number: [id number] Patient Account Number: [id number] Date of Birth/Sex: Treating RN: 1955-08-25 (67 y.o. Yates Decamp Primary Care Kaiyah Eber: Jethro Bastos Other Clinician: Referring Nakiah Osgood: Treating Merrily Tegeler/Extender: Vista Lawman in Treatment: 4 Vital Signs Time Taken: 08:10 Temperature (F): 97.9 Height (in): 68 Pulse (bpm): 77 Weight (lbs): 230 Respiratory Rate (breaths/min): 18 Body Mass Index (BMI): 35 Blood Pressure (mmHg): 153/83 Reference Range:  80 - 120  mg / dl Electronic Signature(s) Signed: 03/20/2023 2:02:59 PM By: Brenton Grills Entered By: Brenton Grills on 03/19/2023 08:13:17

## 2023-03-22 LAB — MULTIPLE MYELOMA PANEL, SERUM
Albumin SerPl Elph-Mcnc: 2.9 g/dL (ref 2.9–4.4)
Albumin/Glob SerPl: 1.1 (ref 0.7–1.7)
Alpha 1: 0.3 g/dL (ref 0.0–0.4)
Alpha2 Glob SerPl Elph-Mcnc: 0.9 g/dL (ref 0.4–1.0)
B-Globulin SerPl Elph-Mcnc: 0.9 g/dL (ref 0.7–1.3)
Gamma Glob SerPl Elph-Mcnc: 0.7 g/dL (ref 0.4–1.8)
Globulin, Total: 2.8 g/dL (ref 2.2–3.9)
IgA: 100 mg/dL (ref 61–437)
IgG (Immunoglobin G), Serum: 886 mg/dL (ref 603–1613)
IgM (Immunoglobulin M), Srm: 23 mg/dL (ref 20–172)
M Protein SerPl Elph-Mcnc: 0.2 g/dL — ABNORMAL HIGH
Total Protein ELP: 5.7 g/dL — ABNORMAL LOW (ref 6.0–8.5)

## 2023-03-23 ENCOUNTER — Inpatient Hospital Stay: Payer: Medicare (Managed Care)

## 2023-03-23 ENCOUNTER — Inpatient Hospital Stay (HOSPITAL_BASED_OUTPATIENT_CLINIC_OR_DEPARTMENT_OTHER): Payer: Medicare (Managed Care) | Admitting: Physician Assistant

## 2023-03-23 VITALS — BP 128/75 | HR 70 | Temp 97.5°F | Resp 17 | Ht 68.0 in | Wt 251.0 lb

## 2023-03-23 DIAGNOSIS — Z5111 Encounter for antineoplastic chemotherapy: Secondary | ICD-10-CM | POA: Diagnosis not present

## 2023-03-23 DIAGNOSIS — C9002 Multiple myeloma in relapse: Secondary | ICD-10-CM

## 2023-03-23 LAB — CMP (CANCER CENTER ONLY)
ALT: 15 U/L (ref 0–44)
AST: 12 U/L — ABNORMAL LOW (ref 15–41)
Albumin: 3.6 g/dL (ref 3.5–5.0)
Alkaline Phosphatase: 92 U/L (ref 38–126)
Anion gap: 5 (ref 5–15)
BUN: 17 mg/dL (ref 8–23)
CO2: 28 mmol/L (ref 22–32)
Calcium: 9.5 mg/dL (ref 8.9–10.3)
Chloride: 110 mmol/L (ref 98–111)
Creatinine: 0.76 mg/dL (ref 0.61–1.24)
GFR, Estimated: >60 mL/min (ref >60)
Glucose, Bld: 102 mg/dL — ABNORMAL HIGH (ref 70–99)
Potassium: 3.8 mmol/L (ref 3.5–5.1)
Sodium: 143 mmol/L (ref 135–145)
Total Bilirubin: 0.5 mg/dL (ref 0.3–1.2)
Total Protein: 6.5 g/dL (ref 6.5–8.1)

## 2023-03-23 LAB — CBC WITH DIFFERENTIAL (CANCER CENTER ONLY)
Abs Immature Granulocytes: 0.05 10*3/uL (ref 0.00–0.07)
Basophils Absolute: 0 10*3/uL (ref 0.0–0.1)
Basophils Relative: 1 %
Eosinophils Absolute: 0.3 10*3/uL (ref 0.0–0.5)
Eosinophils Relative: 4 %
HCT: 34.1 % — ABNORMAL LOW (ref 39.0–52.0)
Hemoglobin: 11.5 g/dL — ABNORMAL LOW (ref 13.0–17.0)
Immature Granulocytes: 1 %
Lymphocytes Relative: 12 %
Lymphs Abs: 0.9 10*3/uL (ref 0.7–4.0)
MCH: 25.1 pg — ABNORMAL LOW (ref 26.0–34.0)
MCHC: 33.7 g/dL (ref 30.0–36.0)
MCV: 74.3 fL — ABNORMAL LOW (ref 80.0–100.0)
Monocytes Absolute: 0.7 10*3/uL (ref 0.1–1.0)
Monocytes Relative: 9 %
Neutro Abs: 5.4 10*3/uL (ref 1.7–7.7)
Neutrophils Relative %: 73 %
Platelet Count: 202 10*3/uL (ref 150–400)
RBC: 4.59 MIL/uL (ref 4.22–5.81)
RDW: 21.2 % — ABNORMAL HIGH (ref 11.5–15.5)
WBC Count: 7.4 10*3/uL (ref 4.0–10.5)
nRBC: 0 % (ref 0.0–0.2)

## 2023-03-23 MED ORDER — SODIUM CHLORIDE 0.9 % IV SOLN: Freq: Once | INTRAVENOUS | Status: DC

## 2023-03-23 MED ORDER — DEXTROSE 5 % IV SOLN
70.0000 mg/m2 | Freq: Once | INTRAVENOUS | Status: AC
  Administered 2023-03-23: 150 mg via INTRAVENOUS
  Filled 2023-03-23: qty 60

## 2023-03-23 MED ORDER — SODIUM CHLORIDE 0.9 % IV SOLN
40.0000 mg | Freq: Once | INTRAVENOUS | Status: AC
  Administered 2023-03-23: 40 mg via INTRAVENOUS
  Filled 2023-03-23 (×2): qty 4

## 2023-03-23 MED ORDER — SODIUM CHLORIDE 0.9 % IV SOLN: Freq: Once | INTRAVENOUS | Status: AC

## 2023-03-23 NOTE — Patient Instructions (Signed)
[facility]  Discharge Instructions: Thank you for choosing [facility] to provide your oncology and hematology care.   If you have a lab appointment with the Cancer Center, please go directly to the Cancer Center and check in at the registration area.   Wear comfortable clothing and clothing appropriate for easy access to any Portacath or PICC line.   We strive to give you quality time with your provider. You may need to reschedule your appointment if you arrive late (15 or more minutes).  Arriving late affects you and other patients whose appointments are after yours.  Also, if you miss three or more appointments without notifying the office, you may be dismissed from the clinic at the provider's discretion.      For prescription refill requests, have your pharmacy contact our office and allow 72 hours for refills to be completed.    Today you received the following chemotherapy and/or immunotherapy agents :  Kyprolis   To help prevent nausea and vomiting after your treatment, we encourage you to take your nausea medication as directed.  BELOW ARE SYMPTOMS THAT SHOULD BE REPORTED IMMEDIATELY: *FEVER GREATER THAN 100.4 F (38 C) OR HIGHER *CHILLS OR SWEATING *NAUSEA AND VOMITING THAT IS NOT CONTROLLED WITH YOUR NAUSEA MEDICATION *UNUSUAL SHORTNESS OF BREATH *UNUSUAL BRUISING OR BLEEDING *URINARY PROBLEMS (pain or burning when urinating, or frequent urination) *BOWEL PROBLEMS (unusual diarrhea, constipation, pain near the anus) TENDERNESS IN MOUTH AND THROAT WITH OR WITHOUT PRESENCE OF ULCERS (sore throat, sores in mouth, or a toothache) UNUSUAL RASH, SWELLING OR PAIN  UNUSUAL VAGINAL DISCHARGE OR ITCHING   Items with * indicate a potential emergency and should be followed up as soon as possible or go to the Emergency Department if any problems should occur.  Please show the CHEMOTHERAPY ALERT CARD or IMMUNOTHERAPY ALERT CARD at  check-in to the Emergency Department and triage nurse.  Should you have questions after your visit or need to cancel or reschedule your appointment, please contact [facility]  Dept: [phone]  and follow the prompts.  Office hours are 8:00 a.m. to 4:30 p.m. Monday - Friday. Please note that voicemails left after 4:00 p.m. may not be returned until the following business day.  We are closed weekends and major holidays. You have access to a nurse at all times for urgent questions. Please call the main number to the clinic Dept: [phone] and follow the prompts.   For any non-urgent questions, you may also contact your provider using MyChart. We now offer e-Visits for anyone 18 and older to request care online for non-urgent symptoms. For details visit [email or web address].   Also download the MyChart app! Go to the app store, search "MyChart", open the app, select [facility], and log in with your MyChart username and password.   

## 2023-03-30 ENCOUNTER — Inpatient Hospital Stay: Payer: Medicare (Managed Care) | Attending: Physician Assistant

## 2023-03-30 ENCOUNTER — Encounter: Payer: Self-pay | Admitting: Hematology and Oncology

## 2023-03-30 ENCOUNTER — Ambulatory Visit: Payer: Medicare (Managed Care)

## 2023-03-30 ENCOUNTER — Inpatient Hospital Stay: Payer: Medicare (Managed Care)

## 2023-03-30 ENCOUNTER — Other Ambulatory Visit: Payer: Medicare (Managed Care)

## 2023-03-30 VITALS — BP 120/72 | HR 78 | Temp 98.0°F | Resp 16

## 2023-03-30 DIAGNOSIS — C9002 Multiple myeloma in relapse: Secondary | ICD-10-CM

## 2023-03-30 DIAGNOSIS — Z5111 Encounter for antineoplastic chemotherapy: Secondary | ICD-10-CM | POA: Diagnosis present

## 2023-03-30 LAB — CBC WITH DIFFERENTIAL (CANCER CENTER ONLY)
Abs Immature Granulocytes: 0.03 10*3/uL (ref 0.00–0.07)
Basophils Absolute: 0 10*3/uL (ref 0.0–0.1)
Basophils Relative: 0 %
Eosinophils Absolute: 0.3 10*3/uL (ref 0.0–0.5)
Eosinophils Relative: 5 %
HCT: 32.3 % — ABNORMAL LOW (ref 39.0–52.0)
Hemoglobin: 10.8 g/dL — ABNORMAL LOW (ref 13.0–17.0)
Immature Granulocytes: 0 %
Lymphocytes Relative: 10 %
Lymphs Abs: 0.7 10*3/uL (ref 0.7–4.0)
MCH: 25.2 pg — ABNORMAL LOW (ref 26.0–34.0)
MCHC: 33.4 g/dL (ref 30.0–36.0)
MCV: 75.3 fL — ABNORMAL LOW (ref 80.0–100.0)
Monocytes Absolute: 0.6 10*3/uL (ref 0.1–1.0)
Monocytes Relative: 9 %
Neutro Abs: 5.4 10*3/uL (ref 1.7–7.7)
Neutrophils Relative %: 76 %
Platelet Count: 173 10*3/uL (ref 150–400)
RBC: 4.29 MIL/uL (ref 4.22–5.81)
RDW: 21.4 % — ABNORMAL HIGH (ref 11.5–15.5)
WBC Count: 7.1 10*3/uL (ref 4.0–10.5)
nRBC: 0.3 % — ABNORMAL HIGH (ref 0.0–0.2)

## 2023-03-30 LAB — CMP (CANCER CENTER ONLY)
ALT: 21 U/L (ref 0–44)
AST: 11 U/L — ABNORMAL LOW (ref 15–41)
Albumin: 3.5 g/dL (ref 3.5–5.0)
Alkaline Phosphatase: 96 U/L (ref 38–126)
Anion gap: 5 (ref 5–15)
BUN: 14 mg/dL (ref 8–23)
CO2: 27 mmol/L (ref 22–32)
Calcium: 9 mg/dL (ref 8.9–10.3)
Chloride: 112 mmol/L — ABNORMAL HIGH (ref 98–111)
Creatinine: 0.73 mg/dL (ref 0.61–1.24)
GFR, Estimated: >60 mL/min (ref >60)
Glucose, Bld: 116 mg/dL — ABNORMAL HIGH (ref 70–99)
Potassium: 3.4 mmol/L — ABNORMAL LOW (ref 3.5–5.1)
Sodium: 144 mmol/L (ref 135–145)
Total Bilirubin: 0.7 mg/dL (ref 0.3–1.2)
Total Protein: 6.1 g/dL — ABNORMAL LOW (ref 6.5–8.1)

## 2023-03-30 MED ORDER — DEXTROSE 5 % IV SOLN
70.0000 mg/m2 | Freq: Once | INTRAVENOUS | Status: AC
  Administered 2023-03-30: 150 mg via INTRAVENOUS
  Filled 2023-03-30: qty 60

## 2023-03-30 MED ORDER — SODIUM CHLORIDE 0.9 % IV SOLN
40.0000 mg | Freq: Once | INTRAVENOUS | Status: AC
  Administered 2023-03-30: 40 mg via INTRAVENOUS
  Filled 2023-03-30: qty 4

## 2023-03-30 MED ORDER — SODIUM CHLORIDE 0.9 % IV SOLN: Freq: Once | INTRAVENOUS | Status: AC

## 2023-03-30 NOTE — Patient Instructions (Signed)
[place] [facility]  Discharge Instructions: Thank you for choosing [facility] to provide your oncology and hematology care.   If you have a lab appointment with the Cancer Center, please go directly to the Cancer Center and check in at the registration area.   Wear comfortable clothing and clothing appropriate for easy access to any Portacath or PICC line.   We strive to give you quality time with your provider. You may need to reschedule your appointment if you arrive late (15 or more minutes).  Arriving late affects you and other patients whose appointments are after yours.  Also, if you miss three or more appointments without notifying the office, you may be dismissed from the clinic at the provider's discretion.      For prescription refill requests, have your pharmacy contact our office and allow 72 hours for refills to be completed.    Today you received the following chemotherapy and/or immunotherapy agents kyprolis      To help prevent nausea and vomiting after your treatment, we encourage you to take your nausea medication as directed.  BELOW ARE SYMPTOMS THAT SHOULD BE REPORTED IMMEDIATELY: *FEVER GREATER THAN 100.4 F (38 C) OR HIGHER *CHILLS OR SWEATING *NAUSEA AND VOMITING THAT IS NOT CONTROLLED WITH YOUR NAUSEA MEDICATION *UNUSUAL SHORTNESS OF BREATH *UNUSUAL BRUISING OR BLEEDING *URINARY PROBLEMS (pain or burning when urinating, or frequent urination) *BOWEL PROBLEMS (unusual diarrhea, constipation, pain near the anus) TENDERNESS IN MOUTH AND THROAT WITH OR WITHOUT PRESENCE OF ULCERS (sore throat, sores in mouth, or a toothache) UNUSUAL RASH, SWELLING OR PAIN  UNUSUAL VAGINAL DISCHARGE OR ITCHING   Items with * indicate a potential emergency and should be followed up as soon as possible or go to the Emergency Department if any problems should occur.  Please show the CHEMOTHERAPY ALERT CARD or IMMUNOTHERAPY ALERT CARD at  check-in to the Emergency Department and triage nurse.  Should you have questions after your visit or need to cancel or reschedule your appointment, please contact [place] [facility]  Dept: [phone]  and follow the prompts.  Office hours are 8:00 a.m. to 4:30 p.m. Monday - Friday. Please note that voicemails left after 4:00 p.m. may not be returned until the following business day.  We are closed weekends and major holidays. You have access to a nurse at all times for urgent questions. Please call the main number to the clinic Dept: [phone] and follow the prompts.   For any non-urgent questions, you may also contact your provider using MyChart. We now offer e-Visits for anyone 41 and older to request care online for non-urgent symptoms. For details visit [email or web address].   Also download the MyChart app! Go to the app store, search "MyChart", open the app, select , and log in with your MyChart username and password.

## 2023-04-02 ENCOUNTER — Ambulatory Visit (HOSPITAL_BASED_OUTPATIENT_CLINIC_OR_DEPARTMENT_OTHER): Payer: Medicare (Managed Care) | Admitting: Internal Medicine

## 2023-04-13 ENCOUNTER — Other Ambulatory Visit: Payer: Self-pay | Admitting: Hematology and Oncology

## 2023-04-13 ENCOUNTER — Inpatient Hospital Stay: Payer: Medicare (Managed Care)

## 2023-04-13 ENCOUNTER — Inpatient Hospital Stay (HOSPITAL_BASED_OUTPATIENT_CLINIC_OR_DEPARTMENT_OTHER): Payer: Medicare (Managed Care) | Admitting: Hematology and Oncology

## 2023-04-13 VITALS — BP 136/78 | HR 65 | Temp 98.1°F | Resp 18 | Ht 68.0 in | Wt 247.2 lb

## 2023-04-13 DIAGNOSIS — C9002 Multiple myeloma in relapse: Secondary | ICD-10-CM | POA: Diagnosis not present

## 2023-04-13 DIAGNOSIS — Z5111 Encounter for antineoplastic chemotherapy: Secondary | ICD-10-CM | POA: Diagnosis not present

## 2023-04-13 LAB — CBC WITH DIFFERENTIAL (CANCER CENTER ONLY)
Abs Immature Granulocytes: 0.04 10*3/uL (ref 0.00–0.07)
Basophils Absolute: 0.1 10*3/uL (ref 0.0–0.1)
Basophils Relative: 1 %
Eosinophils Absolute: 0.4 10*3/uL (ref 0.0–0.5)
Eosinophils Relative: 5 %
HCT: 34.7 % — ABNORMAL LOW (ref 39.0–52.0)
Hemoglobin: 11.3 g/dL — ABNORMAL LOW (ref 13.0–17.0)
Immature Granulocytes: 1 %
Lymphocytes Relative: 13 %
Lymphs Abs: 1.1 10*3/uL (ref 0.7–4.0)
MCH: 25.1 pg — ABNORMAL LOW (ref 26.0–34.0)
MCHC: 32.6 g/dL (ref 30.0–36.0)
MCV: 76.9 fL — ABNORMAL LOW (ref 80.0–100.0)
Monocytes Absolute: 0.8 10*3/uL (ref 0.1–1.0)
Monocytes Relative: 10 %
Neutro Abs: 5.7 10*3/uL (ref 1.7–7.7)
Neutrophils Relative %: 70 %
Platelet Count: 439 10*3/uL — ABNORMAL HIGH (ref 150–400)
RBC: 4.51 MIL/uL (ref 4.22–5.81)
RDW: 21.4 % — ABNORMAL HIGH (ref 11.5–15.5)
WBC Count: 8 10*3/uL (ref 4.0–10.5)
nRBC: 0 % (ref 0.0–0.2)

## 2023-04-13 LAB — CMP (CANCER CENTER ONLY)
ALT: 14 U/L (ref 0–44)
AST: 12 U/L — ABNORMAL LOW (ref 15–41)
Albumin: 4 g/dL (ref 3.5–5.0)
Alkaline Phosphatase: 96 U/L (ref 38–126)
Anion gap: 7 (ref 5–15)
BUN: 15 mg/dL (ref 8–23)
CO2: 27 mmol/L (ref 22–32)
Calcium: 9.6 mg/dL (ref 8.9–10.3)
Chloride: 109 mmol/L (ref 98–111)
Creatinine: 0.85 mg/dL (ref 0.61–1.24)
GFR, Estimated: >60 mL/min (ref >60)
Glucose, Bld: 120 mg/dL — ABNORMAL HIGH (ref 70–99)
Potassium: 4.2 mmol/L (ref 3.5–5.1)
Sodium: 143 mmol/L (ref 135–145)
Total Bilirubin: 0.7 mg/dL (ref 0.3–1.2)
Total Protein: 7 g/dL (ref 6.5–8.1)

## 2023-04-13 MED ORDER — SODIUM CHLORIDE 0.9 % IV SOLN: Freq: Once | INTRAVENOUS | Status: AC

## 2023-04-13 MED ORDER — HEPARIN SOD (PORK) LOCK FLUSH 100 UNIT/ML IV SOLN: 500.0000 [IU] | Freq: Once | INTRAVENOUS | Status: DC | PRN

## 2023-04-13 MED ORDER — SODIUM CHLORIDE 0.9% FLUSH: 10.0000 mL | INTRAVENOUS | Status: DC | PRN

## 2023-04-13 MED ORDER — SODIUM CHLORIDE 0.9 % IV SOLN
40.0000 mg | Freq: Once | INTRAVENOUS | Status: AC
  Administered 2023-04-13: 40 mg via INTRAVENOUS
  Filled 2023-04-13: qty 4

## 2023-04-13 MED ORDER — DEXTROSE 5 % IV SOLN
70.0000 mg/m2 | Freq: Once | INTRAVENOUS | Status: AC
  Administered 2023-04-13: 150 mg via INTRAVENOUS
  Filled 2023-04-13: qty 60

## 2023-04-13 NOTE — Patient Instructions (Signed)
[place] [facility]  Discharge Instructions: Thank you for choosing [facility] to provide your oncology and hematology care.   If you have a lab appointment with the Cancer Center, please go directly to the Cancer Center and check in at the registration area.   Wear comfortable clothing and clothing appropriate for easy access to any Portacath or PICC line.   We strive to give you quality time with your provider. You may need to reschedule your appointment if you arrive late (15 or more minutes).  Arriving late affects you and other patients whose appointments are after yours.  Also, if you miss three or more appointments without notifying the office, you may be dismissed from the clinic at the provider's discretion.      For prescription refill requests, have your pharmacy contact our office and allow 72 hours for refills to be completed.    Today you received the following chemotherapy and/or immunotherapy agents: Kyprolis.       To help prevent nausea and vomiting after your treatment, we encourage you to take your nausea medication as directed.  BELOW ARE SYMPTOMS THAT SHOULD BE REPORTED IMMEDIATELY: *FEVER GREATER THAN 100.4 F (38 C) OR HIGHER *CHILLS OR SWEATING *NAUSEA AND VOMITING THAT IS NOT CONTROLLED WITH YOUR NAUSEA MEDICATION *UNUSUAL SHORTNESS OF BREATH *UNUSUAL BRUISING OR BLEEDING *URINARY PROBLEMS (pain or burning when urinating, or frequent urination) *BOWEL PROBLEMS (unusual diarrhea, constipation, pain near the anus) TENDERNESS IN MOUTH AND THROAT WITH OR WITHOUT PRESENCE OF ULCERS (sore throat, sores in mouth, or a toothache) UNUSUAL RASH, SWELLING OR PAIN  UNUSUAL VAGINAL DISCHARGE OR ITCHING   Items with * indicate a potential emergency and should be followed up as soon as possible or go to the Emergency Department if any problems should occur.  Please show the CHEMOTHERAPY ALERT CARD or IMMUNOTHERAPY ALERT CARD at  check-in to the Emergency Department and triage nurse.  Should you have questions after your visit or need to cancel or reschedule your appointment, please contact [place] [facility]  Dept: [phone]  and follow the prompts.  Office hours are 8:00 a.m. to 4:30 p.m. Monday - Friday. Please note that voicemails left after 4:00 p.m. may not be returned until the following business day.  We are closed weekends and major holidays. You have access to a nurse at all times for urgent questions. Please call the main number to the clinic Dept: [phone] and follow the prompts.   For any non-urgent questions, you may also contact your provider using MyChart. We now offer e-Visits for anyone 13 and older to request care online for non-urgent symptoms. For details visit [email or web address].   Also download the MyChart app! Go to the app store, search "MyChart", open the app, select [place], and log in with your MyChart username and password.

## 2023-04-13 NOTE — Progress Notes (Signed)
[facility] [phone]   [phone]  PROGRESS NOTE  Patient Care Team: Gary Bastos, MD as PCP - General (Family Medicine) Gary Oyster, MD as Consulting Physician (Physical Medicine and Rehabilitation) Gary Alamin Mcarthur Rossetti, PA-C as Physician Assistant (Physical Medicine and Rehabilitation) Gary Standard, MD as Consulting Physician (Hematology and Oncology) Gary Standard, MD as Consulting Physician (Hematology and Oncology) Gary Standard, MD as Consulting Physician (Hematology and Oncology)  Hematological/Oncological History # IgG Lambda Multiple Myeloma, Relapsed (ISS Stage II) 1) 06/2010: initial diagnosis of Multiple Myeloma after T8 compression fracture. Treated with Velcade/Revlimid/Dexamethasone and achieved a complete remission 2) Velcade was discontinued in September 2012 and that Revlimid and Decadron were discontinued in March 2013. 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 4) Followed by Dr. Bertis Howell, last clinic visit 10/09/2019. At that time there was concern for relapse of his multiple myeloma.  5) Patient requested transfer to different provider after misunderstanding regarding imaging studies 6) 12/17/2019: transfer care to Dr. Leonides Howell  7) 01/09/2020: Cycle 1 Day 1 of Dara/Velcade/Dex 8) 01/21/2020: presented as urgent visit for diarrhea and dehydration. Holding chemotherapy scheduled for 01/23/2020. 9) 01/30/2020: Resume dara/velcade/dex after resolution of diarrhea.  10) 02/13/2020: restaging labs show M protein 0.8, Kappa 4.5, lamba 17.2, ratio 0.26, urine M protein 53 (7.1%). All MM labs indicate improvement.  11) 03/10/2020: Cycle 4 Day 1 of Dara/Velcade/Dex. Transition to q 3 week daratumumab.  12) 06/04/2020:  Cycle 8 Day 1 of Dara/Velcade/Dex 13) 06/25/2020:  Cycle 9 Day 1 of Dara/Velcade/Dex 14) 07/22/2020: Cycle 10 Day 1  of  Dara//Dex 15) 08/18/2020: Cycle 11 Day 1 of Dara//Dex 16) 09/23/2020: Cycle 12 Day 1 of Dara//Dex 17) 10/22/2020: Cycle 13 Day 1 of Dara/Dex  18) 11/19/2020: Cycle 14 Day 1 of Dara/Dex  19) 12/17/2020: Cycle 15 Day 1 of Dara/Dex  20) 01/17/2021: Cycle 16 Day 1 of Dara/Dex  21) 02/11/2021: Cycle 17 Day 1 of Dara/Dex 22) 03/10/2021: Cycle 18 Day 1 of Dara/Dex  23) 04/08/2021: Cycle 19 Day 1 of Dara/Dex  24) 08/03/2021: Cycle 20 Day 1 of Dara/Dex (delayed due to scheduling error) 25) 09/02/2021: Cycle 21 Day 1 of Dara/Dex 26) 09/30/2021: Cycle 22 Day 1 of Dara/Dex 27) 10/28/2021: Cycle 23 Day 1 of Dara/Dex 28) 12/02/2021: Cycle 24 Day 1 of Dara/Dex 29) 12/30/2021: Cycle 25 Day 1 of Dara/Dex 30) 01/06/2022: Cycle 1 Day 1 of Kyprolis/Dex 31) 02/03/2022: Cycle 2 Day 1 of Kyprolis/Dex 32) 02/24/2022: Cycle 3 Day 1 of Kyprolis/Dex 33) 04/07/2022: Cycle 4 Day 1 of Kyprolis/Dex 34) 05/05/2022: Cycle 5 Day 1 of Kyprolis/Dex 35) 06/01/2022: Cycle 6 Day 1 of Kyprolis/Dex 36) 06/29/2022: Cycle 7 Day 1 of Kyprolis/Dex 37) 07/28/2022: Cycle 8 Day 1 of Kyprolis/Dex 38) 08/25/2022: Cycle 9 Day 1 of Kyprolis/Dex 39) 09/22/2022: Cycle 10 Day 1 of Kyprolis/Dex 40) 10/20/2022: Cycle 11 Day 1 of Kyprolis/Dex 41) 11/17/2022: Cycle 12 Day 1 of Kyprolis/Dex 42 ) 12/22/2022-01/08/2023: Admitted for fournier's gangrene of scrotum.  43) 01/26/2023: Cycle 13 Day 1 of Kyprolis/Dex 44) 02/16/2023: Cycle 14 Day 1 of Kyprolis/Dex 45) 03/16/2023:Cycle 15 Day 1 of Kyprolis/Dex 46) 04/13/2023: Cycle 16 Day 1 of Kyprolis/Dex  Interval History:  [name] 67 y.o. male with medical history significant for  IgG Lambda Multiple Myeloma who presents for a follow up visit. The patient's last visit was on 03/23/2023.  He presents today  prior to treatment with Kyprolis/Dex. He is unaccompanied for this visit.   On exam today [name] reports he has been feeling well overall and has no recent issues with his health.  He notes that he is having some  trouble with the CPAP mask as is wearing on his nose.  He will be meeting with the sleep doctors to discuss this to see if there needs to be adjustments made to his mask.  He reports otherwise he is tolerating his treatments well with no major side effects.  He also notes that he is not having any difficulty with his surgical site from the gangrene.  He denies nausea, vomiting or abdominal pain. He has good output from his colostomy and good urinary output. He denies easy bruising or any signs of bleeding.He denies fevers, chills, night sweats, shortness of breath, chest pain or abdominal pain.  He has no other complaints.  Rest of the 10 point ROS is below.  MEDICAL HISTORY:  Past Medical History:  Diagnosis Date   Adrenal insufficiency (HCC)    on chronic dexamethasone   Anemia    Cancer (HCC)    Coagulopathy (HCC)    on xeralto/ s/p DVT while on coumadin,  IVC in place   Diabetes mellitus without complication (HCC)    type 2   Glaucoma 12/22/2022   Gross hematuria 01/2013   post foley cath procedure   History of blood transfusion 01/2013   Lower GI bleeding 04/18/2017   Multiple myeloma    thoracic T8 with paraplegia s/p resection- on chemo at visit 10/13/10   Multiple myeloma    Multiple myeloma without mention of remission    Neurogenic bladder    Neurogenic bowel    OSA (obstructive sleep apnea) 11/01/2022   Paraplegia (HCC)    Partial small bowel obstruction (HCC) during dec 2011 admission    SURGICAL HISTORY: Past Surgical History:  Procedure Laterality Date   COLONOSCOPY WITH PROPOFOL N/A 04/12/2017   Procedure: COLONOSCOPY WITH PROPOFOL;  Surgeon: Gary Fredrickson, MD;  Location: [facility];  Service: Endoscopy;  Laterality: N/A;   COLONOSCOPY WITH PROPOFOL N/A 04/19/2017   Procedure: COLONOSCOPY WITH PROPOFOL;  Surgeon: Gary Deeds, MD;  Location: [facility];  Service: Gastroenterology;  Laterality: N/A;   COLOSTOMY  07/20/2011   Procedure: COLOSTOMY;  Surgeon:  Gary Abide, DO;  Location: [facility] OR;  Service: General;;   COLOSTOMY REVISION  07/20/2011   Procedure: COLON RESECTION SIGMOID;  Surgeon: Gary Abide, DO;  [facility];  Service: General;;   CYSTOSCOPY N/A 04/04/2013   Procedure: CYSTOSCOPY WITH LITHALOPAXY;  Surgeon: Sebastian Ache, MD;  Location: [facility];  Service: Urology;  Laterality: N/A;   INCISION AND DRAINAGE ABSCESS N/A 12/25/2022   Procedure: INCISION AND DRAINAGE OF SCROTUM;  Surgeon: Despina Arias, MD;  Location: [facility];  Service: Urology;  Laterality: N/A;   INSERTION OF SUPRAPUBIC CATHETER N/A 04/04/2013   Procedure: INSERTION OF SUPRAPUBIC CATHETER;  Surgeon: Sebastian Ache, MD;  Location: [facility];  Service: Urology;  Laterality: N/A;   LAPAROTOMY  07/20/2011   Procedure: EXPLORATORY LAPAROTOMY;  Surgeon: Gary Abide, DO;  Location: [facility] OR;  Service: General;  Laterality: N/A;   myeloma thoracic T8 with parpaplegia s/p thoracotomy and thoracic T7-9 cage placement on Dec 26th 2011  07/18/10   SCROTAL EXPLORATION N/A 12/29/2022   Procedure: SCROTAL WOUND DEBRIDEMENT AND CLOSURE;  Surgeon: Loletta Parish., MD;  Location: [facility];  Service: Urology;  Laterality: N/A;    SOCIAL HISTORY: Social History   Socioeconomic History   Marital status: Married    Spouse name: Not on file   Number of children: Not on file   Years of education: Not on file   Highest education level: Not on file  Occupational History   Not on file  Tobacco Use   Smoking status: Never   Smokeless tobacco: Never  Vaping Use   Vaping status: Never Used  Substance and Sexual Activity   Alcohol use: No   Drug use: No   Sexual activity: Never  Other Topics Concern   Not on file  Social History Narrative   Not on file   Social Determinants of Health   Financial Resource Strain: Not on file  Food Insecurity: No Food Insecurity (12/22/2022)   Hunger Vital Sign    Worried About Running Out of Food in the Last Year: Never true     Ran Out of Food in the Last Year: Never true  Transportation Needs: No Transportation Needs (12/22/2022)   PRAPARE - Administrator, Civil Service (Medical): No    Lack of Transportation (Non-Medical): No  Physical Activity: Not on file  Stress: Not on file  Social Connections: Not on file  Intimate Partner Violence: Not At Risk (12/22/2022)   Humiliation, Afraid, Rape, and Kick questionnaire    Fear of Current or Ex-Partner: No    Emotionally Abused: No    Physically Abused: No    Sexually Abused: No    FAMILY HISTORY: Family History  Problem Relation Age of Onset   Ovarian cancer Mother    Diabetes Father     ALLERGIES:  is allergic to ferumoxytol.  MEDICATIONS:  Current Outpatient Medications  Medication Sig Dispense Refill   acetaminophen (TYLENOL) 325 MG tablet Take 325-650 mg by mouth every 6 (six) hours as needed for headache or mild pain.     acetic acid 0.25 % irrigation Irrigate with 1 Application as directed See admin instructions. Instill 50 ml's, clamp for 10 minutes, then remove clamp and drain the bladder. Repeat 3 times a week- when not using the Renacidin irrigation     acyclovir (ZOVIRAX) 400 MG tablet Take 1 tablet (400 mg total) by mouth 2 (two) times daily. 60 tablet 2   albuterol (VENTOLIN HFA) 108 (90 Base) MCG/ACT inhaler Inhale 2 puffs into the lungs every 4 (four) hours as needed for wheezing or shortness of breath.     atorvastatin (LIPITOR) 20 MG tablet Take 20 mg by mouth daily.     BENADRYL ALLERGY 25 MG tablet Take 25 mg by mouth every 6 (six) hours as needed (for allergic reactions).     BIOFREEZE 4 % GEL Apply 1 application  topically every 4 (four) hours as needed (for joint pain- to intact areas of the skin only).     Citric Ac-Gluconolact-Mg Carb (RENACIDIN IR) Irrigate with 30 mLs as directed See admin instructions. Instill 30 ml's, clamp for 10 minutes, then remove clamp and drain the bladder. Repeat 3 times a week- when not using the  acetic acid irrigation     COSOPT PF 2-0.5 % SOLN Place 1 drop into both eyes 2 (two) times daily.     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     Emollient (EUCERIN EX) Apply 1 application  topically See admin instructions. Apply to affected area  2 times a day     FLOVENT HFA 110 MCG/ACT inhaler 2 puffs See admin instructions. 2 sprays to colostomy stoma prior to each colostomy bag change     iron polysaccharides (NU-IRON) 150 MG capsule Take 1 capsule (150 mg total) by mouth daily. 30 capsule 0   latanoprost (XALATAN) 0.005 % ophthalmic solution Place 1 drop into both eyes at bedtime.     Multiple Vitamin (MULTIVITAMIN WITH MINERALS) TABS tablet Take 1 tablet by mouth daily. 30 tablet 0   NON FORMULARY Apply 1 application  topically See admin instructions. Colo Plast paste- Apply as directed with each ostomy bag change     ondansetron (ZOFRAN) 4 MG tablet Take 1 tablet (4 mg) by mouth every 6 hours as needed for nausea. 20 tablet 0   rivaroxaban (XARELTO) 10 MG TABS tablet Take 1 tablet (10 mg total) by mouth daily with supper. (Patient taking differently: Take 10 mg by mouth daily.) 30 tablet 9   Skin Protectants, Misc. (MINERIN CREME) CREA Apply to affected areas 2 times daily as needed. 454 g 0   Zinc Oxide (BALMEX EX) Apply 1 application  topically See admin instructions. Apply topically daily to affected sites     No current facility-administered medications for this visit.   Facility-Administered Medications Ordered in Other Visits  Medication Dose Route Frequency Provider Last Rate Last Admin   heparin lock flush 100 unit/mL  500 Units Intracatheter Once PRN Ulysees Barns IV, MD       sodium chloride flush (NS) 0.9 % injection 10 mL  10 mL Intravenous PRN [name], Ni, MD       sodium chloride flush (NS) 0.9 % injection 10 mL  10 mL Intracatheter PRN Gary Standard, MD        REVIEW OF SYSTEMS:    Constitutional: ( - ) fevers, ( - )  chills , ( - ) night sweats Eyes: ( - ) blurriness of vision, ( - ) double vision, ( - ) watery eyes Ears, nose, mouth, throat, and face: ( - ) mucositis, ( - ) sore throat Respiratory: ( - ) cough, ( - ) dyspnea, ( - ) wheezes Cardiovascular: ( - ) palpitation, ( - ) chest discomfort, ( - ) lower extremity swelling Gastrointestinal:  ( - ) nausea, ( - ) heartburn, ( - ) change in bowel habits Skin: ( - ) abnormal skin rashes Lymphatics: ( - ) new lymphadenopathy, ( - ) easy bruising Neurological: ( - ) numbness, ( - ) tingling, ( - ) new weaknesses Behavioral/Psych: ( - ) mood change, ( - ) new changes  All other systems were reviewed with the patient and are negative.  PHYSICAL EXAMINATION: ECOG PERFORMANCE STATUS: paraplegic.   Vitals:   04/13/23 0859  BP: 136/78  Pulse: 65  Resp: 18  Temp: 98.1 F (36.7 C)  SpO2: 100%     Filed Weights   04/13/23 0859  Weight: 247 lb 3.2 oz (112.1 kg)     GENERAL: well appearing middle aged [place] American male alert, no distress and comfortable SKIN: skin color, texture, turgor are normal, no rashes or significant lesions EYES: conjunctiva are pink and non-injected, sclera clear LUNGS: clear to auscultation and percussion with normal breathing effort HEART: regular rate & rhythm and no murmurs Musculoskeletal: no cyanosis of digits and no clubbing  PSYCH: alert & oriented x 3, fluent speech NEURO: paraplegic, no use of LE bilaterally.   LABORATORY DATA:  I have  reviewed the data as listed    Latest Ref Rng & Units 04/13/2023    8:16 AM 03/30/2023    8:53 AM 03/23/2023    9:56 AM  CBC  WBC 4.0 - 10.5 K/uL 8.0  7.1  7.4   Hemoglobin 13.0 - 17.0 g/dL 96.2  95.2  84.1   Hematocrit 39.0 - 52.0 % 34.7  32.3  34.1   Platelets 150 - 400 K/uL 439  173  202        Latest Ref Rng & Units 04/13/2023    8:16 AM 03/30/2023    8:53 AM 03/23/2023    9:56 AM  CMP  Glucose 70 - 99 mg/dL 324  401  027    BUN 8 - 23 mg/dL 15  14  17    Creatinine 0.61 - 1.24 mg/dL 2.53  6.64  4.03   Sodium 135 - 145 mmol/L 143  144  143   Potassium 3.5 - 5.1 mmol/L 4.2  3.4  3.8   Chloride 98 - 111 mmol/L 109  112  110   CO2 22 - 32 mmol/L 27  27  28    Calcium 8.9 - 10.3 mg/dL 9.6  9.0  9.5   Total Protein 6.5 - 8.1 g/dL 7.0  6.1  6.5   Total Bilirubin 0.3 - 1.2 mg/dL 0.7  0.7  0.5   Alkaline Phos 38 - 126 U/L 96  96  92   AST 15 - 41 U/L 12  11  12    ALT 0 - 44 U/L 14  21  15      Lab Results  Component Value Date   MPROTEIN 0.2 (H) 03/16/2023   MPROTEIN 0.3 (H) 01/26/2023   MPROTEIN 0.2 (H) 11/17/2022   Lab Results  Component Value Date   KPAFRELGTCHN 21.2 (H) 03/16/2023   KPAFRELGTCHN 37.4 (H) 01/26/2023   KPAFRELGTCHN 10.4 11/17/2022   LAMBDASER 13.1 03/16/2023   LAMBDASER 23.1 01/26/2023   LAMBDASER 7.9 11/17/2022   KAPLAMBRATIO 1.62 03/16/2023   KAPLAMBRATIO 1.62 01/26/2023   KAPLAMBRATIO 1.32 11/17/2022    RADIOGRAPHIC STUDIES: No results found.  ASSESSMENT & PLAN [name] 67 y.o. male with medical history significant for  IgG Lambda Multiple Myeloma who presents for a follow up visit.  # IgG Lambda Multiple Myeloma, Relapsed (ISS Stage II) --findings are most consistent with relapsed multiple myeloma. Patient previously successfully treated with Velcade/Rev/Dex and Daratumumab/Velcade/Dex. On 07/02/2020 he transitioned to monthly daratumumab alone.  --due to to rise in M protein, switched to Kyprolis and Dexamethasone on 01/06/2022 Plan: --Labs show white blood cell count 8.0, hemoglobin 1.3, MCV 76.9, and platelets of 439, Creatinine and LFTs are normal.  --Most recent myeloma labs from 03/16/2023 showed M protein of 0.2. Kappa light chain elevated at 21.2, lambda light chain and ratio normal.  --Continue with weekly labs plus Kyprolis/Dex treatment as scheduled  #Sepsis 2/2 UTI and Fournier's gangrene of scrotum: --Hospitalized from 531/2024-01/08/2023. Underwent  debridement of necrotic tissue. --Given PIP tazobactam and Zyvox initially --Urine culture reveals Proteus mirabilis which is resistant to ciprofloxacin and nitrofurantoin --Blood cultures negative --D/C on remainder doxycycline and Augmentin which he completed on 01/07/2023.  --Evaluated by urology on 7/2 and ID on 7/3 with continued wound healing and no further d/c or evidence of infection.   #History of DVT --He had placement of IVC filter, remains on Xarelto. --Due to poor mobility, and lack of bleeding complications, recommend to continue on Xarelto indefinitely. --caution if Plt count were to  drop <50  #Snoring/Risk for OSA: --Diagnosed with OSA and has CPAP machine.   # Supportive Care -- provided patient with an albuterol inhaler (for use with daratumumab) --acyclovir 400mg  BID for VZV prophylaxis --zofran 8mg  q8H PRN for nausea/vomiting  --Zometa is being held in the setting of his prior episode of osteonecrosis of the jaw  Orders Placed This Encounter  Procedures   Multiple Myeloma Panel (SPEP&IFE w/QIG)    Standing Status:   Future    Standing Expiration Date:   05/10/2024   Kappa/lambda light chains    Standing Status:   Future    Standing Expiration Date:   05/10/2024   CBC with Differential (Cancer Center Only)    Standing Status:   Future    Standing Expiration Date:   05/10/2024   CMP (Cancer Center only)    Standing Status:   Future    Standing Expiration Date:   05/10/2024   CBC with Differential (Cancer Center Only)    Standing Status:   Future    Standing Expiration Date:   05/17/2024   CMP (Cancer Center only)    Standing Status:   Future    Standing Expiration Date:   05/17/2024   CBC with Differential (Cancer Center Only)    Standing Status:   Future    Standing Expiration Date:   05/24/2024   CMP (Cancer Center only)    Standing Status:   Future    Standing Expiration Date:   05/24/2024   Multiple Myeloma Panel (SPEP&IFE w/QIG)    Standing Status:    Future    Standing Expiration Date:   06/07/2024   Kappa/lambda light chains    Standing Status:   Future    Standing Expiration Date:   06/07/2024   CBC with Differential (Cancer Center Only)    Standing Status:   Future    Standing Expiration Date:   06/07/2024   CMP (Cancer Center only)    Standing Status:   Future    Standing Expiration Date:   06/07/2024   CBC with Differential (Cancer Center Only)    Standing Status:   Future    Standing Expiration Date:   06/14/2024   CMP (Cancer Center only)    Standing Status:   Future    Standing Expiration Date:   06/14/2024   CBC with Differential (Cancer Center Only)    Standing Status:   Future    Standing Expiration Date:   06/21/2024   CMP (Cancer Center only)    Standing Status:   Future    Standing Expiration Date:   06/21/2024   Multiple Myeloma Panel (SPEP&IFE w/QIG)    Standing Status:   Future    Standing Expiration Date:   07/05/2024   Kappa/lambda light chains    Standing Status:   Future    Standing Expiration Date:   07/05/2024   CBC with Differential (Cancer Center Only)    Standing Status:   Future    Standing Expiration Date:   07/05/2024   CMP (Cancer Center only)    Standing Status:   Future    Standing Expiration Date:   07/05/2024   CBC with Differential (Cancer Center Only)    Standing Status:   Future    Standing Expiration Date:   07/12/2024   CMP (Cancer Center only)    Standing Status:   Future    Standing Expiration Date:   07/12/2024   CBC with Differential (Cancer Center Only)    Standing  Status:   Future    Standing Expiration Date:   07/19/2024   CMP (Cancer Center only)    Standing Status:   Future    Standing Expiration Date:   07/19/2024    All questions were answered. The patient knows to call the clinic with any problems, questions or concerns.  I have spent a total of 30 minutes minutes of face-to-face and non-face-to-face time, preparing to see the patient,  performing a medically  appropriate examination, counseling and educating the patient, communicating with other health care professionals, documenting clinical information in the electronic health record,  and care coordination.   Georga Kaufmann PA-C Dept of Hematology and Oncology [facility] Phone: [phone]    04/13/2023 2:48 PM   Literature Support:  Milagros Loll, Petrucci MT, [place], [place], [place], [place], Spada S, [place], Ponticelli E, [place], Cavo M, Di Toritto TC, Haydee Salter F, [place] V, Palumbo A, Boccadoro M, Larocca A. Once-weekly versus twice-weekly carfilzomib in patients with newly diagnosed multiple myeloma: a pooled analysis of two phase I/II studies. Haematologica. 2019 Aug;104(8):1640-1647.  --Once-weekly 70 mg/m2 carfilzomib as induction and maintenance therapy for newly diagnosed multiple myeloma patients was as safe and effective as twice-weekly 36 mg/m2 carfilzomib and provided a more convenient schedule.

## 2023-04-16 ENCOUNTER — Encounter (HOSPITAL_BASED_OUTPATIENT_CLINIC_OR_DEPARTMENT_OTHER): Payer: Medicare (Managed Care) | Attending: Internal Medicine | Admitting: Internal Medicine

## 2023-04-16 DIAGNOSIS — Z09 Encounter for follow-up examination after completed treatment for conditions other than malignant neoplasm: Secondary | ICD-10-CM | POA: Insufficient documentation

## 2023-04-16 DIAGNOSIS — C9000 Multiple myeloma not having achieved remission: Secondary | ICD-10-CM | POA: Insufficient documentation

## 2023-04-16 DIAGNOSIS — Z872 Personal history of diseases of the skin and subcutaneous tissue: Secondary | ICD-10-CM | POA: Insufficient documentation

## 2023-04-16 DIAGNOSIS — G8220 Paraplegia, unspecified: Secondary | ICD-10-CM | POA: Diagnosis not present

## 2023-04-16 LAB — KAPPA/LAMBDA LIGHT CHAINS
Kappa free light chain: 13.1 mg/L (ref 3.3–19.4)
Kappa, lambda light chain ratio: 1.27 (ref 0.26–1.65)
Lambda free light chains: 10.3 mg/L (ref 5.7–26.3)

## 2023-04-16 NOTE — Progress Notes (Signed)
of skin of other sites with fat layer exposed Unspecified open wound of unspecified external genital organs, male, initial encounter Fournier gangrene Paraplegia, unspecified [name], [name] ([id number]) [id number] Page 4 of 4 Plan Discharge From [facility] Services: Discharge from Wound Care Center - Call if any future wound care needs. May shower. 1. The patient can be discharged from the wound care center 2. Advised to keep the area clean and dry. He showers at the pace program lives at home with his wife. Electronic Signature(s) Signed: 04/16/2023 3:56:56 PM By: Baltazar Najjar MD Signed: 04/16/2023 4:21:40 PM By: Shawn Stall RN, BSN Entered By: Shawn Stall on 04/16/2023 05:48:48 -------------------------------------------------------------------------------- SuperBill Details Patient Name: Date of Service: [name]. 04/16/2023 Medical Record Number: [id number] Patient Account Number: [id number] Date of Birth/Sex: Treating RN: Feb 19, 1956 (67 y.o. Tammy Sours Primary Care Provider: Jethro Bastos Other Clinician: Referring Provider: Treating Provider/Extender: Meda Klinefelter in Treatment: 8 Diagnosis Coding ICD-10 Codes Code Description [phone] Non-pressure chronic ulcer of skin of other sites with fat layer exposed S31.501A Unspecified open wound of  unspecified external genital organs, male, initial encounter N49.3 Fournier gangrene G82.20 Paraplegia, unspecified Facility Procedures : CPT4 Code: 21308657 Description: 99213 - WOUND CARE VISIT-LEV 3 EST PT Modifier: Quantity: 1 Physician Procedures : CPT4 Code Description Modifier [id number] - WC PHYS LEVEL 2 - EST PT ICD-10 Diagnosis Description L98.492 Non-pressure chronic ulcer of skin of other sites with fat layer exposed S31.501A Unspecified open wound of unspecified external genital  organs, male, initial encounter N49.3 Fournier gangrene Quantity: 1 Electronic Signature(s) Signed: 04/16/2023 3:56:56 PM By: Baltazar Najjar MD Entered By: Baltazar Najjar on 04/16/2023 05:24:50  of skin of other sites with fat layer exposed Unspecified open wound of unspecified external genital organs, male, initial encounter Fournier gangrene Paraplegia, unspecified [name], [name] ([id number]) [id number] Page 4 of 4 Plan Discharge From [facility] Services: Discharge from Wound Care Center - Call if any future wound care needs. May shower. 1. The patient can be discharged from the wound care center 2. Advised to keep the area clean and dry. He showers at the pace program lives at home with his wife. Electronic Signature(s) Signed: 04/16/2023 3:56:56 PM By: Baltazar Najjar MD Signed: 04/16/2023 4:21:40 PM By: Shawn Stall RN, BSN Entered By: Shawn Stall on 04/16/2023 05:48:48 -------------------------------------------------------------------------------- SuperBill Details Patient Name: Date of Service: [name]. 04/16/2023 Medical Record Number: [id number] Patient Account Number: [id number] Date of Birth/Sex: Treating RN: Feb 19, 1956 (67 y.o. Tammy Sours Primary Care Provider: Jethro Bastos Other Clinician: Referring Provider: Treating Provider/Extender: Meda Klinefelter in Treatment: 8 Diagnosis Coding ICD-10 Codes Code Description [phone] Non-pressure chronic ulcer of skin of other sites with fat layer exposed S31.501A Unspecified open wound of  unspecified external genital organs, male, initial encounter N49.3 Fournier gangrene G82.20 Paraplegia, unspecified Facility Procedures : CPT4 Code: 21308657 Description: 99213 - WOUND CARE VISIT-LEV 3 EST PT Modifier: Quantity: 1 Physician Procedures : CPT4 Code Description Modifier [id number] 52841 - WC PHYS LEVEL 2 - EST PT ICD-10 Diagnosis Description L98.492 Non-pressure chronic ulcer of skin of other sites with fat layer exposed S31.501A Unspecified open wound of unspecified external genital  organs, male, initial encounter N49.3 Fournier gangrene Quantity: 1 Electronic Signature(s) Signed: 04/16/2023 3:56:56 PM By: Baltazar Najjar MD Entered By: Baltazar Najjar on 04/16/2023 05:24:50  AMISH, DEFENBAUGH ([id number]) [id number] Page 1 of 4 Visit Report for 04/16/2023 HPI Details Patient Name: Date of Service: [name]. 04/16/2023 8:00 A M Medical Record Number: [id number] Patient Account Number: [id number] Date of Birth/Sex: Treating RN: [birth year] (67 y.o. M) Primary Care Provider: Jethro Bastos Other Clinician: Referring Provider: Treating Provider/Extender: Meda Klinefelter in Treatment: 8 History of Present Illness HPI Description: 02/13/2023 [name] is a 67 year old male with a past medical history of paraplegia and multiple myeloma that presents to the clinic for a 1-87-month history of wound to his right groin. Patient developed an abscess and required incision and drainage of the scrotum on 6/3 and 6/7 by Dr. Angelica Chessman, urology. He was diagnosed with foreigners gangrene. He completed a course of doxycycline and Augmentin. He followed with infectious disease with no further current recommendations. He currently denies signs of infection. He is using normal saline wet-to-dry dressings. He has no issues or complaints. He still has 5 sutures in place that are not keeping the skin together. 8/12; patient presents for follow-up. He has been using Vashe wet-to-dry dressings daily. He has no issues or complaints. Wound is smaller. 8/26; patient presents for follow-up. He has been using Vashe wet-to-dry dressings. Wound is smaller. He has no issues or complaints. 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 He apparently had to surgeries. He has been using Vashe wet-to-dry and is followed at the pace program. The area is healed today Electronic Signature(s) Signed: 04/16/2023 3:56:56 PM By: Baltazar Najjar MD Entered By: Baltazar Najjar on 04/16/2023 05:22:21 -------------------------------------------------------------------------------- Physical Exam  Details Patient Name: Date of Service: Gary Perch W. 04/16/2023 8:00 A M Medical Record Number: [id number] Patient Account Number: [id number] Date of Birth/Sex: Treating RN: [birth year] (67 y.o. M) Primary Care Provider: Jethro Bastos Other Clinician: Referring Provider: Treating Provider/Extender: Meda Klinefelter in Treatment: 8 Constitutional Sitting or standing Blood Pressure is within target range for patient.. Pulse regular and within target range for patient.Marland Kitchen Respirations regular, non-labored and within target range.. Temperature is normal and within the target range for the patient.Marland Kitchen Appears in no distress. Notes Wound exam sent to the right groin everything is closed nice healthy incision line. No masses Electronic Signature(s) Signed: 04/16/2023 3:56:56 PM By: Baltazar Najjar MD Entered By: Baltazar Najjar on 04/16/2023 05:23:14 [name] ([id number]) [id number] Page 2 of 4 -------------------------------------------------------------------------------- Physician Orders Details Patient Name: Date of Service: [name]. 04/16/2023 8:00 A M Medical Record Number: [id number] Patient Account Number: [id number] Date of Birth/Sex: Treating RN: Nov 08, 1955 (67 y.o. Tammy Sours Primary Care Provider: Jethro Bastos Other Clinician: Referring Provider: Treating Provider/Extender: Meda Klinefelter in Treatment: 8 The following information was scribed by: Shawn Stall The information was scribed for: Baltazar Najjar Verbal / Phone Orders: No Diagnosis Coding ICD-10 Coding Code Description L98.492 Non-pressure chronic ulcer of skin of other sites with fat layer exposed S31.501A Unspecified open wound of unspecified external genital organs, male, initial encounter N49.3 Fournier gangrene G82.20 Paraplegia, unspecified Discharge From [facility] Services Discharge from Wound Care  Center - Call if any future wound care needs. May shower. Electronic Signature(s) Signed: 04/16/2023 3:56:56 PM By: Baltazar Najjar MD Signed: 04/16/2023 4:21:40 PM By: Shawn Stall RN, BSN Entered By: Shawn Stall on 04/16/2023 05:48:38 -------------------------------------------------------------------------------- Problem List Details Patient Name: Date of Service: [facility],

## 2023-04-16 NOTE — Progress Notes (Signed)
0 Epithelialization: Large (67-100%) 0 Tunneling: No 0 Undermining: No Wound Description Classification: Full Thickness Without Exposed Support Structures Wound Margin: Epibole Exudate Amount: None Present Foul Odor After Cleansing: No Slough/Fibrino No Wound Bed Granulation Amount: None Present (0%) Exposed Structure Necrotic Amount: None Present (0%) Fascia Exposed: No Fat Layer (Subcutaneous Tissue) Exposed: No Tendon Exposed: No Muscle Exposed: No Joint Exposed: No Bone Exposed: No Periwound Skin Texture Texture Color No Abnormalities Noted: No No Abnormalities Noted: No Callus: No Atrophie Blanche: No Crepitus: No Cyanosis: No Excoriation: No Ecchymosis: No Induration: No Erythema: No Rash: No Hemosiderin  Staining: No Scarring: No Mottled: No Pallor: No Moisture Rubor: No No Abnormalities Noted: No Dry / Scaly: No Maceration: No Electronic Signature(s) Signed: 04/16/2023 4:21:40 PM By: Shawn Stall RN, BSN Entered By: Shawn Stall on 04/16/2023 05:18:13 -------------------------------------------------------------------------------- Vitals Details Patient Name: Date of Service: Gary Perch W. 04/16/2023 8:00 A M Medical Record Number: [id number] Patient Account Number: [id number] Date of Birth/Sex: Treating RN: [birth year] (67 y.o. [name], [name] Primary Care Talin Feister: Jethro Bastos Other Clinician: Referring Louellen Haldeman: Treating [name]/Extender: Meda Klinefelter in Treatment: 8 Vital Signs Time Taken: 08:00 Temperature (F): 98 Height (in): 68 Pulse (bpm): 93 Weight (lbs): 230 Respiratory Rate (breaths/min): 16 Body Mass Index (BMI): 35 Blood Pressure (mmHg): 137/85 Reference Range: 80 - 120 mg / dl Electronic Signature(s) Signed: 04/16/2023 4:21:40 PM By: Shawn Stall RN, BSN Entered By: Shawn Stall on 04/16/2023 05:11:38  0 Epithelialization: Large (67-100%) 0 Tunneling: No 0 Undermining: No Wound Description Classification: Full Thickness Without Exposed Support Structures Wound Margin: Epibole Exudate Amount: None Present Foul Odor After Cleansing: No Slough/Fibrino No Wound Bed Granulation Amount: None Present (0%) Exposed Structure Necrotic Amount: None Present (0%) Fascia Exposed: No Fat Layer (Subcutaneous Tissue) Exposed: No Tendon Exposed: No Muscle Exposed: No Joint Exposed: No Bone Exposed: No Periwound Skin Texture Texture Color No Abnormalities Noted: No No Abnormalities Noted: No Callus: No Atrophie Blanche: No Crepitus: No Cyanosis: No Excoriation: No Ecchymosis: No Induration: No Erythema: No Rash: No Hemosiderin  Staining: No Scarring: No Mottled: No Pallor: No Moisture Rubor: No No Abnormalities Noted: No Dry / Scaly: No Maceration: No Electronic Signature(s) Signed: 04/16/2023 4:21:40 PM By: Shawn Stall RN, BSN Entered By: Shawn Stall on 04/16/2023 05:18:13 -------------------------------------------------------------------------------- Vitals Details Patient Name: Date of Service: Gary Perch W. 04/16/2023 8:00 A M Medical Record Number: [id number] Patient Account Number: [id number] Date of Birth/Sex: Treating RN: [birth year] (67 y.o. [name], [name] Primary Care Talin Feister: Jethro Bastos Other Clinician: Referring Louellen Haldeman: Treating [name]/Extender: Meda Klinefelter in Treatment: 8 Vital Signs Time Taken: 08:00 Temperature (F): 98 Height (in): 68 Pulse (bpm): 93 Weight (lbs): 230 Respiratory Rate (breaths/min): 16 Body Mass Index (BMI): 35 Blood Pressure (mmHg): 137/85 Reference Range: 80 - 120 mg / dl Electronic Signature(s) Signed: 04/16/2023 4:21:40 PM By: Shawn Stall RN, BSN Entered By: Shawn Stall on 04/16/2023 05:11:38  Wound Recurrence: 0x0x0 N/A N/A Measurements L x W x D (cm) 0 N/A N/A A (cm) : rea 0 N/A N/A Volume (cm) : 100.00% N/A N/A % Reduction in Area: 100.00% N/A N/A % Reduction in Volume: Full Thickness Without Exposed N/A N/A Classification: Support Structures None Present N/A N/A Exudate Amount: Epibole N/A N/A Wound Margin: None Present (0%) N/A N/A Granulation Amount: None Present (0%) N/A N/A Necrotic Amount: Fascia: No N/A N/A Exposed Structures: Fat Layer (Subcutaneous Tissue): No Tendon: No Muscle: No Joint: No Bone: No Large (67-100%) N/A N/A Epithelialization: Excoriation: No N/A N/A Periwound Skin Texture: Induration: No Callus: No Crepitus: No Rash: No Scarring: No Maceration: No N/A N/A Periwound Skin Moisture: Dry/Scaly: No Atrophie Blanche: No N/A N/A Periwound Skin Color: Cyanosis: No Ecchymosis: No Erythema: No Hemosiderin Staining: No Mottled: No Pallor: No Rubor: No Treatment Notes Electronic Signature(s) Signed: 04/16/2023 3:56:56 PM By: Baltazar Najjar MD Entered By: Baltazar Najjar on 04/16/2023 05:21:06 [name] ([id number]) [id number] Page 5 of 7 -------------------------------------------------------------------------------- Multi-Disciplinary Care Plan Details Patient Name: Date of Service: Gary Fass. 04/16/2023 8:00 A M Medical Record Number: [id number] Patient Account Number: [id number] Date  of Birth/Sex: Treating RN: 01-12-1956 (67 y.o. [name] Primary Care Adiya Selmer: Jethro Bastos Other Clinician: Referring Roxann Vierra: Treating Hitomi Slape/Extender: Meda Klinefelter in Treatment: 8 Active Inactive Electronic Signature(s) Signed: 04/16/2023 4:21:40 PM By: Shawn Stall RN, BSN Entered By: Shawn Stall on 04/16/2023 05:12:22 -------------------------------------------------------------------------------- Pain Assessment Details Patient Name: Date of Service: Gary Fass. 04/16/2023 8:00 A M Medical Record Number: [id number] Patient Account Number: [id number] Date of Birth/Sex: Treating RN: 02-24-1956 (67 y.o. [name] Primary Care Derk Doubek: Jethro Bastos Other Clinician: Referring Orlena Garmon: Treating Kaysi Ourada/Extender: Meda Klinefelter in Treatment: 8 Active Problems Location of Pain Severity and Description of Pain Patient Has Paino No Site Locations Pain Management and Medication Current Pain Management: Electronic Signature(s) Signed: 04/16/2023 4:21:40 PM By: Shawn Stall RN, BSN Entered By: Shawn Stall on 04/16/2023 05:11:42 [name] ([id number]) [id number] Page 6 of 7 -------------------------------------------------------------------------------- Patient/Caregiver Education Details Patient Name: Date of Service: Gary Fass 9/23/2024andnbsp8:00 A M Medical Record Number: [id number] Patient Account Number: [id number] Date of Birth/Gender: Treating RN: [birth year] (67 y.o. [name] Primary Care Physician: Jethro Bastos Other Clinician: Referring Physician: Treating Physician/Extender: Meda Klinefelter in Treatment: 8 Education Assessment Education Provided To: Patient Education Topics Provided Wound/Skin Impairment: Handouts: Caring for Your Ulcer Methods: Explain/Verbal Responses: Reinforcements  needed Electronic Signature(s) Signed: 04/16/2023 4:21:40 PM By: Shawn Stall RN, BSN Entered By: Shawn Stall on 04/16/2023 05:12:32 -------------------------------------------------------------------------------- Wound Assessment Details Patient Name: Date of Service: Gary Perch W. 04/16/2023 8:00 A M Medical Record Number: [id number] Patient Account Number: [id number] Date of Birth/Sex: Treating RN: [birth year] (67 y.o. [name] Primary Care Husayn Reim: Jethro Bastos Other Clinician: Referring Maia Handa: Treating Hoyt Leanos/Extender: Meda Klinefelter in Treatment: 8 Wound Status Wound Number: 4 Primary Open Surgical Wound Etiology: Wound Location: Right Groin Wound Healed - Epithelialized Wounding Event: Bump Status: Date Acquired: 12/25/2022 Comorbid Glaucoma, Anemia, Sleep Apnea, Deep Vein Thrombosis, Type Weeks Of Treatment: 8 History: II Diabetes, Paraplegia Clustered Wound: No Photos Wound Measurements Length: (cm) ABERHAM, MCFARREN ([id number]) Width: (cm) Depth: (cm) Area: (cm) Volume: (cm) 0 % Reduction in Area: 100% [id number] Page 7 of 7 0 % Reduction in Volume: 100%  Wound Recurrence: 0x0x0 N/A N/A Measurements L x W x D (cm) 0 N/A N/A A (cm) : rea 0 N/A N/A Volume (cm) : 100.00% N/A N/A % Reduction in Area: 100.00% N/A N/A % Reduction in Volume: Full Thickness Without Exposed N/A N/A Classification: Support Structures None Present N/A N/A Exudate Amount: Epibole N/A N/A Wound Margin: None Present (0%) N/A N/A Granulation Amount: None Present (0%) N/A N/A Necrotic Amount: Fascia: No N/A N/A Exposed Structures: Fat Layer (Subcutaneous Tissue): No Tendon: No Muscle: No Joint: No Bone: No Large (67-100%) N/A N/A Epithelialization: Excoriation: No N/A N/A Periwound Skin Texture: Induration: No Callus: No Crepitus: No Rash: No Scarring: No Maceration: No N/A N/A Periwound Skin Moisture: Dry/Scaly: No Atrophie Blanche: No N/A N/A Periwound Skin Color: Cyanosis: No Ecchymosis: No Erythema: No Hemosiderin Staining: No Mottled: No Pallor: No Rubor: No Treatment Notes Electronic Signature(s) Signed: 04/16/2023 3:56:56 PM By: Baltazar Najjar MD Entered By: Baltazar Najjar on 04/16/2023 05:21:06 [name] ([id number]) [id number] Page 5 of 7 -------------------------------------------------------------------------------- Multi-Disciplinary Care Plan Details Patient Name: Date of Service: Gary Fass. 04/16/2023 8:00 A M Medical Record Number: [id number] Patient Account Number: [id number] Date  of Birth/Sex: Treating RN: 01-12-1956 (67 y.o. [name] Primary Care Adiya Selmer: Jethro Bastos Other Clinician: Referring Roxann Vierra: Treating Hitomi Slape/Extender: Meda Klinefelter in Treatment: 8 Active Inactive Electronic Signature(s) Signed: 04/16/2023 4:21:40 PM By: Shawn Stall RN, BSN Entered By: Shawn Stall on 04/16/2023 05:12:22 -------------------------------------------------------------------------------- Pain Assessment Details Patient Name: Date of Service: Gary Fass. 04/16/2023 8:00 A M Medical Record Number: [id number] Patient Account Number: [id number] Date of Birth/Sex: Treating RN: 02-24-1956 (67 y.o. [name] Primary Care Derk Doubek: Jethro Bastos Other Clinician: Referring Orlena Garmon: Treating Kaysi Ourada/Extender: Meda Klinefelter in Treatment: 8 Active Problems Location of Pain Severity and Description of Pain Patient Has Paino No Site Locations Pain Management and Medication Current Pain Management: Electronic Signature(s) Signed: 04/16/2023 4:21:40 PM By: Shawn Stall RN, BSN Entered By: Shawn Stall on 04/16/2023 05:11:42 [name] ([id number]) [id number] Page 6 of 7 -------------------------------------------------------------------------------- Patient/Caregiver Education Details Patient Name: Date of Service: Gary Fass 9/23/2024andnbsp8:00 A M Medical Record Number: [id number] Patient Account Number: [id number] Date of Birth/Gender: Treating RN: [birth year] (67 y.o. [name] Primary Care Physician: Jethro Bastos Other Clinician: Referring Physician: Treating Physician/Extender: Meda Klinefelter in Treatment: 8 Education Assessment Education Provided To: Patient Education Topics Provided Wound/Skin Impairment: Handouts: Caring for Your Ulcer Methods: Explain/Verbal Responses: Reinforcements  needed Electronic Signature(s) Signed: 04/16/2023 4:21:40 PM By: Shawn Stall RN, BSN Entered By: Shawn Stall on 04/16/2023 05:12:32 -------------------------------------------------------------------------------- Wound Assessment Details Patient Name: Date of Service: Gary Perch W. 04/16/2023 8:00 A M Medical Record Number: [id number] Patient Account Number: [id number] Date of Birth/Sex: Treating RN: [birth year] (67 y.o. [name] Primary Care Husayn Reim: Jethro Bastos Other Clinician: Referring Maia Handa: Treating Hoyt Leanos/Extender: Meda Klinefelter in Treatment: 8 Wound Status Wound Number: 4 Primary Open Surgical Wound Etiology: Wound Location: Right Groin Wound Healed - Epithelialized Wounding Event: Bump Status: Date Acquired: 12/25/2022 Comorbid Glaucoma, Anemia, Sleep Apnea, Deep Vein Thrombosis, Type Weeks Of Treatment: 8 History: II Diabetes, Paraplegia Clustered Wound: No Photos Wound Measurements Length: (cm) ABERHAM, MCFARREN ([id number]) Width: (cm) Depth: (cm) Area: (cm) Volume: (cm) 0 % Reduction in Area: 100% [id number] Page 7 of 7 0 % Reduction in Volume: 100%

## 2023-04-19 LAB — MULTIPLE MYELOMA PANEL, SERUM
Albumin SerPl Elph-Mcnc: 3.4 g/dL (ref 2.9–4.4)
Albumin/Glob SerPl: 1.2 (ref 0.7–1.7)
Alpha 1: 0.3 g/dL (ref 0.0–0.4)
Alpha2 Glob SerPl Elph-Mcnc: 1 g/dL (ref 0.4–1.0)
B-Globulin SerPl Elph-Mcnc: 1.1 g/dL (ref 0.7–1.3)
Gamma Glob SerPl Elph-Mcnc: 0.6 g/dL (ref 0.4–1.8)
Globulin, Total: 3 g/dL (ref 2.2–3.9)
IgA: 70 mg/dL (ref 61–437)
IgG (Immunoglobin G), Serum: 816 mg/dL (ref 603–1613)
IgM (Immunoglobulin M), Srm: 23 mg/dL (ref 20–172)
M Protein SerPl Elph-Mcnc: 0.3 g/dL — ABNORMAL HIGH
Total Protein ELP: 6.4 g/dL (ref 6.0–8.5)

## 2023-04-20 ENCOUNTER — Inpatient Hospital Stay: Payer: Medicare (Managed Care)

## 2023-04-20 VITALS — BP 141/71 | HR 78 | Resp 18

## 2023-04-20 DIAGNOSIS — C9002 Multiple myeloma in relapse: Secondary | ICD-10-CM

## 2023-04-20 DIAGNOSIS — Z5111 Encounter for antineoplastic chemotherapy: Secondary | ICD-10-CM | POA: Diagnosis not present

## 2023-04-20 LAB — CBC WITH DIFFERENTIAL (CANCER CENTER ONLY)
Abs Immature Granulocytes: 0.06 10*3/uL (ref 0.00–0.07)
Basophils Absolute: 0 10*3/uL (ref 0.0–0.1)
Basophils Relative: 0 %
Eosinophils Absolute: 0.4 10*3/uL (ref 0.0–0.5)
Eosinophils Relative: 4 %
HCT: 33.2 % — ABNORMAL LOW (ref 39.0–52.0)
Hemoglobin: 11 g/dL — ABNORMAL LOW (ref 13.0–17.0)
Immature Granulocytes: 1 %
Lymphocytes Relative: 11 %
Lymphs Abs: 1 10*3/uL (ref 0.7–4.0)
MCH: 25.7 pg — ABNORMAL LOW (ref 26.0–34.0)
MCHC: 33.1 g/dL (ref 30.0–36.0)
MCV: 77.6 fL — ABNORMAL LOW (ref 80.0–100.0)
Monocytes Absolute: 1 10*3/uL (ref 0.1–1.0)
Monocytes Relative: 11 %
Neutro Abs: 6.4 10*3/uL (ref 1.7–7.7)
Neutrophils Relative %: 73 %
Platelet Count: 208 10*3/uL (ref 150–400)
RBC: 4.28 MIL/uL (ref 4.22–5.81)
RDW: 21.9 % — ABNORMAL HIGH (ref 11.5–15.5)
WBC Count: 8.8 10*3/uL (ref 4.0–10.5)
nRBC: 0.2 % (ref 0.0–0.2)

## 2023-04-20 LAB — CMP (CANCER CENTER ONLY)
ALT: 20 U/L (ref 0–44)
AST: 12 U/L — ABNORMAL LOW (ref 15–41)
Albumin: 3.6 g/dL (ref 3.5–5.0)
Alkaline Phosphatase: 98 U/L (ref 38–126)
Anion gap: 5 (ref 5–15)
BUN: 13 mg/dL (ref 8–23)
CO2: 28 mmol/L (ref 22–32)
Calcium: 9.5 mg/dL (ref 8.9–10.3)
Chloride: 111 mmol/L (ref 98–111)
Creatinine: 0.88 mg/dL (ref 0.61–1.24)
GFR, Estimated: >60 mL/min (ref >60)
Glucose, Bld: 115 mg/dL — ABNORMAL HIGH (ref 70–99)
Potassium: 4.4 mmol/L (ref 3.5–5.1)
Sodium: 144 mmol/L (ref 135–145)
Total Bilirubin: 0.7 mg/dL (ref 0.3–1.2)
Total Protein: 6.3 g/dL — ABNORMAL LOW (ref 6.5–8.1)

## 2023-04-20 MED ORDER — SODIUM CHLORIDE 0.9 % IV SOLN
40.0000 mg | Freq: Once | INTRAVENOUS | Status: AC
  Administered 2023-04-20: 40 mg via INTRAVENOUS
  Filled 2023-04-20: qty 4

## 2023-04-20 MED ORDER — SODIUM CHLORIDE 0.9 % IV SOLN: Freq: Once | INTRAVENOUS | Status: AC

## 2023-04-20 MED ORDER — DEXTROSE 5 % IV SOLN
70.0000 mg/m2 | Freq: Once | INTRAVENOUS | Status: AC
  Administered 2023-04-20: 150 mg via INTRAVENOUS
  Filled 2023-04-20: qty 60

## 2023-04-20 NOTE — Patient Instructions (Signed)
[facility]  Discharge Instructions: Thank you for choosing [facility] to provide your oncology and hematology care.   If you have a lab appointment with the Cancer Center, please go directly to the Cancer Center and check in at the registration area.   Wear comfortable clothing and clothing appropriate for easy access to any Portacath or PICC line.   We strive to give you quality time with your provider. You may need to reschedule your appointment if you arrive late (15 or more minutes).  Arriving late affects you and other patients whose appointments are after yours.  Also, if you miss three or more appointments without notifying the office, you may be dismissed from the clinic at the provider's discretion.      For prescription refill requests, have your pharmacy contact our office and allow 72 hours for refills to be completed.    Today you received the following chemotherapy and/or immunotherapy agents :  Kyprolis   To help prevent nausea and vomiting after your treatment, we encourage you to take your nausea medication as directed.  BELOW ARE SYMPTOMS THAT SHOULD BE REPORTED IMMEDIATELY: *FEVER GREATER THAN 100.4 F (38 C) OR HIGHER *CHILLS OR SWEATING *NAUSEA AND VOMITING THAT IS NOT CONTROLLED WITH YOUR NAUSEA MEDICATION *UNUSUAL SHORTNESS OF BREATH *UNUSUAL BRUISING OR BLEEDING *URINARY PROBLEMS (pain or burning when urinating, or frequent urination) *BOWEL PROBLEMS (unusual diarrhea, constipation, pain near the anus) TENDERNESS IN MOUTH AND THROAT WITH OR WITHOUT PRESENCE OF ULCERS (sore throat, sores in mouth, or a toothache) UNUSUAL RASH, SWELLING OR PAIN  UNUSUAL VAGINAL DISCHARGE OR ITCHING   Items with * indicate a potential emergency and should be followed up as soon as possible or go to the Emergency Department if any problems should occur.  Please show the CHEMOTHERAPY ALERT CARD or IMMUNOTHERAPY ALERT CARD at  check-in to the Emergency Department and triage nurse.  Should you have questions after your visit or need to cancel or reschedule your appointment, please contact [facility]  Dept: [phone]  and follow the prompts.  Office hours are 8:00 a.m. to 4:30 p.m. Monday - Friday. Please note that voicemails left after 4:00 p.m. may not be returned until the following business day.  We are closed weekends and major holidays. You have access to a nurse at all times for urgent questions. Please call the main number to the clinic Dept: [phone] and follow the prompts.   For any non-urgent questions, you may also contact your provider using MyChart. We now offer e-Visits for anyone 18 and older to request care online for non-urgent symptoms. For details visit [email or web address].   Also download the MyChart app! Go to the app store, search "MyChart", open the app, select [facility], and log in with your MyChart username and password.   

## 2023-04-27 ENCOUNTER — Inpatient Hospital Stay: Payer: Medicare (Managed Care) | Attending: Physician Assistant

## 2023-04-27 ENCOUNTER — Inpatient Hospital Stay (HOSPITAL_BASED_OUTPATIENT_CLINIC_OR_DEPARTMENT_OTHER): Payer: Medicare (Managed Care) | Attending: Physician Assistant | Admitting: Physician Assistant

## 2023-04-27 VITALS — BP 140/66 | HR 60

## 2023-04-27 DIAGNOSIS — C9002 Multiple myeloma in relapse: Secondary | ICD-10-CM

## 2023-04-27 DIAGNOSIS — R413 Other amnesia: Secondary | ICD-10-CM | POA: Insufficient documentation

## 2023-04-27 DIAGNOSIS — Z7901 Long term (current) use of anticoagulants: Secondary | ICD-10-CM | POA: Diagnosis not present

## 2023-04-27 DIAGNOSIS — R0683 Snoring: Secondary | ICD-10-CM | POA: Insufficient documentation

## 2023-04-27 DIAGNOSIS — Z5111 Encounter for antineoplastic chemotherapy: Secondary | ICD-10-CM | POA: Insufficient documentation

## 2023-04-27 DIAGNOSIS — Z8041 Family history of malignant neoplasm of ovary: Secondary | ICD-10-CM | POA: Diagnosis not present

## 2023-04-27 DIAGNOSIS — Z86718 Personal history of other venous thrombosis and embolism: Secondary | ICD-10-CM | POA: Diagnosis not present

## 2023-04-27 DIAGNOSIS — Z87440 Personal history of urinary (tract) infections: Secondary | ICD-10-CM | POA: Diagnosis not present

## 2023-04-27 LAB — CBC WITH DIFFERENTIAL (CANCER CENTER ONLY)
Abs Immature Granulocytes: 0.08 10*3/uL — ABNORMAL HIGH (ref 0.00–0.07)
Basophils Absolute: 0.1 10*3/uL (ref 0.0–0.1)
Basophils Relative: 1 %
Eosinophils Absolute: 0.3 10*3/uL (ref 0.0–0.5)
Eosinophils Relative: 3 %
HCT: 33.8 % — ABNORMAL LOW (ref 39.0–52.0)
Hemoglobin: 11.3 g/dL — ABNORMAL LOW (ref 13.0–17.0)
Immature Granulocytes: 1 %
Lymphocytes Relative: 11 %
Lymphs Abs: 0.9 10*3/uL (ref 0.7–4.0)
MCH: 26.3 pg (ref 26.0–34.0)
MCHC: 33.4 g/dL (ref 30.0–36.0)
MCV: 78.8 fL — ABNORMAL LOW (ref 80.0–100.0)
Monocytes Absolute: 0.8 10*3/uL (ref 0.1–1.0)
Monocytes Relative: 10 %
Neutro Abs: 6.5 10*3/uL (ref 1.7–7.7)
Neutrophils Relative %: 74 %
Platelet Count: 177 10*3/uL (ref 150–400)
RBC: 4.29 MIL/uL (ref 4.22–5.81)
RDW: 22.5 % — ABNORMAL HIGH (ref 11.5–15.5)
WBC Count: 8.7 10*3/uL (ref 4.0–10.5)
nRBC: 0 % (ref 0.0–0.2)

## 2023-04-27 LAB — CMP (CANCER CENTER ONLY)
ALT: 23 U/L (ref 0–44)
AST: 14 U/L — ABNORMAL LOW (ref 15–41)
Albumin: 3.9 g/dL (ref 3.5–5.0)
Alkaline Phosphatase: 99 U/L (ref 38–126)
Anion gap: 7 (ref 5–15)
BUN: 18 mg/dL (ref 8–23)
CO2: 27 mmol/L (ref 22–32)
Calcium: 9.7 mg/dL (ref 8.9–10.3)
Chloride: 109 mmol/L (ref 98–111)
Creatinine: 0.76 mg/dL (ref 0.61–1.24)
GFR, Estimated: >60 mL/min (ref >60)
Glucose, Bld: 98 mg/dL (ref 70–99)
Potassium: 3.7 mmol/L (ref 3.5–5.1)
Sodium: 143 mmol/L (ref 135–145)
Total Bilirubin: 1.1 mg/dL (ref 0.3–1.2)
Total Protein: 6.5 g/dL (ref 6.5–8.1)

## 2023-04-27 MED ORDER — SODIUM CHLORIDE 0.9 % IV SOLN: Freq: Once | INTRAVENOUS | Status: AC

## 2023-04-27 MED ORDER — SODIUM CHLORIDE 0.9 % IV SOLN
40.0000 mg | Freq: Once | INTRAVENOUS | Status: AC
  Administered 2023-04-27: 40 mg via INTRAVENOUS
  Filled 2023-04-27: qty 4

## 2023-04-27 MED ORDER — DEXTROSE 5 % IV SOLN
70.0000 mg/m2 | Freq: Once | INTRAVENOUS | Status: AC
  Administered 2023-04-27: 150 mg via INTRAVENOUS
  Filled 2023-04-27: qty 60

## 2023-04-27 NOTE — Patient Instructions (Signed)
[facility]  Discharge Instructions: Thank you for choosing [facility] to provide your oncology and hematology care.   If you have a lab appointment with the Cancer Center, please go directly to the Cancer Center and check in at the registration area.   Wear comfortable clothing and clothing appropriate for easy access to any Portacath or PICC line.   We strive to give you quality time with your provider. You may need to reschedule your appointment if you arrive late (15 or more minutes).  Arriving late affects you and other patients whose appointments are after yours.  Also, if you miss three or more appointments without notifying the office, you may be dismissed from the clinic at the provider's discretion.      For prescription refill requests, have your pharmacy contact our office and allow 72 hours for refills to be completed.    Today you received the following chemotherapy and/or immunotherapy agents :  Kyprolis   To help prevent nausea and vomiting after your treatment, we encourage you to take your nausea medication as directed.  BELOW ARE SYMPTOMS THAT SHOULD BE REPORTED IMMEDIATELY: *FEVER GREATER THAN 100.4 F (38 C) OR HIGHER *CHILLS OR SWEATING *NAUSEA AND VOMITING THAT IS NOT CONTROLLED WITH YOUR NAUSEA MEDICATION *UNUSUAL SHORTNESS OF BREATH *UNUSUAL BRUISING OR BLEEDING *URINARY PROBLEMS (pain or burning when urinating, or frequent urination) *BOWEL PROBLEMS (unusual diarrhea, constipation, pain near the anus) TENDERNESS IN MOUTH AND THROAT WITH OR WITHOUT PRESENCE OF ULCERS (sore throat, sores in mouth, or a toothache) UNUSUAL RASH, SWELLING OR PAIN  UNUSUAL VAGINAL DISCHARGE OR ITCHING   Items with * indicate a potential emergency and should be followed up as soon as possible or go to the Emergency Department if any problems should occur.  Please show the CHEMOTHERAPY ALERT CARD or IMMUNOTHERAPY ALERT CARD at  check-in to the Emergency Department and triage nurse.  Should you have questions after your visit or need to cancel or reschedule your appointment, please contact [facility]  Dept: [phone]  and follow the prompts.  Office hours are 8:00 a.m. to 4:30 p.m. Monday - Friday. Please note that voicemails left after 4:00 p.m. may not be returned until the following business day.  We are closed weekends and major holidays. You have access to a nurse at all times for urgent questions. Please call the main number to the clinic Dept: [phone] and follow the prompts.   For any non-urgent questions, you may also contact your provider using MyChart. We now offer e-Visits for anyone 18 and older to request care online for non-urgent symptoms. For details visit [email or web address].   Also download the MyChart app! Go to the app store, search "MyChart", open the app, select [facility], and log in with your MyChart username and password.   

## 2023-04-27 NOTE — Progress Notes (Signed)
[facility] Telephone:([phone]) [phone]   Fax:([phone]) [phone]  PROGRESS NOTE  Patient Care Team: Jethro Bastos, MD as PCP - General (Family Medicine) Ranelle Oyster, MD as Consulting Physician (Physical Medicine and Rehabilitation) Hildred Alamin Mcarthur Rossetti, PA-C as Physician Assistant (Physical Medicine and Rehabilitation) Jaci Standard, MD as Consulting Physician (Hematology and Oncology) Jaci Standard, MD as Consulting Physician (Hematology and Oncology) Jaci Standard, MD as Consulting Physician (Hematology and Oncology)  Hematological/Oncological History # IgG Lambda Multiple Myeloma, Relapsed (ISS Stage II) 1) 06/2010: initial diagnosis of Multiple Myeloma after T8 compression fracture. Treated with Velcade/Revlimid/Dexamethasone and achieved a complete remission 2) Velcade was discontinued in September 2012 and that Revlimid and Decadron were discontinued in March 2013. 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 4) Followed by Dr. Bertis Ruddy, last clinic visit 10/09/2019. At that time there was concern for relapse of his multiple myeloma.  5) Patient requested transfer to different provider after misunderstanding regarding imaging studies 6) 12/17/2019: transfer care to Dr. Leonides Schanz  7) 01/09/2020: Cycle 1 Day 1 of Dara/Velcade/Dex 8) 01/21/2020: presented as urgent visit for diarrhea and dehydration. Holding chemotherapy scheduled for 01/23/2020. 9) 01/30/2020: Resume dara/velcade/dex after resolution of diarrhea.  10) 02/13/2020: restaging labs show M protein 0.8, Kappa 4.5, lamba 17.2, ratio 0.26, urine M protein 53 (7.1%). All MM labs indicate improvement.  11) 03/10/2020: Cycle 4 Day 1 of Dara/Velcade/Dex. Transition to q 3 week daratumumab.  12) 06/04/2020:  Cycle 8 Day 1 of Dara/Velcade/Dex 13) 06/25/2020:  Cycle 9 Day 1 of Dara/Velcade/Dex 14) 07/22/2020: Cycle 10 Day 1  of  Dara//Dex 15) 08/18/2020: Cycle 11 Day 1 of Dara//Dex 16) 09/23/2020: Cycle 12 Day 1 of Dara//Dex 17) 10/22/2020: Cycle 13 Day 1 of Dara/Dex  18) 11/19/2020: Cycle 14 Day 1 of Dara/Dex  19) 12/17/2020: Cycle 15 Day 1 of Dara/Dex  20) 01/17/2021: Cycle 16 Day 1 of Dara/Dex  21) 02/11/2021: Cycle 17 Day 1 of Dara/Dex 22) 03/10/2021: Cycle 18 Day 1 of Dara/Dex  23) 04/08/2021: Cycle 19 Day 1 of Dara/Dex  24) 08/03/2021: Cycle 20 Day 1 of Dara/Dex (delayed due to scheduling error) 25) 09/02/2021: Cycle 21 Day 1 of Dara/Dex 26) 09/30/2021: Cycle 22 Day 1 of Dara/Dex 27) 10/28/2021: Cycle 23 Day 1 of Dara/Dex 28) 12/02/2021: Cycle 24 Day 1 of Dara/Dex 29) 12/30/2021: Cycle 25 Day 1 of Dara/Dex 30) 01/06/2022: Cycle 1 Day 1 of Kyprolis/Dex 31) 02/03/2022: Cycle 2 Day 1 of Kyprolis/Dex 32) 02/24/2022: Cycle 3 Day 1 of Kyprolis/Dex 33) 04/07/2022: Cycle 4 Day 1 of Kyprolis/Dex 34) 05/05/2022: Cycle 5 Day 1 of Kyprolis/Dex 35) 06/01/2022: Cycle 6 Day 1 of Kyprolis/Dex 36) 06/29/2022: Cycle 7 Day 1 of Kyprolis/Dex 37) 07/28/2022: Cycle 8 Day 1 of Kyprolis/Dex 38) 08/25/2022: Cycle 9 Day 1 of Kyprolis/Dex 39) 09/22/2022: Cycle 10 Day 1 of Kyprolis/Dex 40) 10/20/2022: Cycle 11 Day 1 of Kyprolis/Dex 41) 11/17/2022: Cycle 12 Day 1 of Kyprolis/Dex 42 ) 12/22/2022-01/08/2023: Admitted for fournier's gangrene of scrotum.  43) 01/26/2023: Cycle 13 Day 1 of Kyprolis/Dex 44) 02/16/2023: Cycle 14 Day 1 of Kyprolis/Dex 45) 03/16/2023:Cycle 15 Day 1 of Kyprolis/Dex 46) 04/13/2023: Cycle 16 Day 1 of Kyprolis/Dex  Interval History:  PIO EATHERLY III 67 y.o. male with medical history significant for  IgG Lambda Multiple Myeloma who presents for a follow up visit. The patient's last visit was on 04/13/2023.  He presents today  prior to treatment with Kyprolis/Dex. He is unaccompanied for this visit.   On exam today Mr. Mcghee reports he is feeling well without any new or concerning symptoms.Yesterday he had his CPAP  mask refitted. He reports  his energy and appetite are stable. He denies nausea, vomiting or abdominal pain. He has good output from his colostomy and good urinary output. He denies easy bruising or any signs of bleeding.He denies fevers, chills, night sweats, shortness of breath, chest pain or cough.  He has no other complaints.  Rest of the 10 point ROS is below.  MEDICAL HISTORY:  Past Medical History:  Diagnosis Date   Adrenal insufficiency (HCC)    on chronic dexamethasone   Anemia    Cancer (HCC)    Coagulopathy (HCC)    on xeralto/ s/p DVT while on coumadin,  IVC in place   Diabetes mellitus without complication (HCC)    type 2   Glaucoma 12/22/2022   Gross hematuria 01/2013   post foley cath procedure   History of blood transfusion 01/2013   Lower GI bleeding 04/18/2017   Multiple myeloma    thoracic T8 with paraplegia s/p resection- on chemo at visit 10/13/10   Multiple myeloma    Multiple myeloma without mention of remission    Neurogenic bladder    Neurogenic bowel    OSA (obstructive sleep apnea) 11/01/2022   Paraplegia (HCC)    Partial small bowel obstruction (HCC) during dec 2011 admission    SURGICAL HISTORY: Past Surgical History:  Procedure Laterality Date   COLONOSCOPY WITH PROPOFOL N/A 04/12/2017   Procedure: COLONOSCOPY WITH PROPOFOL;  Surgeon: Hilarie Fredrickson, MD;  Location: [facility];  Service: Endoscopy;  Laterality: N/A;   COLONOSCOPY WITH PROPOFOL N/A 04/19/2017   Procedure: COLONOSCOPY WITH PROPOFOL;  Surgeon: Benancio Deeds, MD;  Location: [facility];  Service: Gastroenterology;  Laterality: N/A;   COLOSTOMY  07/20/2011   Procedure: COLOSTOMY;  Surgeon: Rulon Abide, DO;  Location: [facility] OR;  Service: General;;   COLOSTOMY REVISION  07/20/2011   Procedure: COLON RESECTION SIGMOID;  Surgeon: Rulon Abide, DO;  Location: [facility] OR;  Service: General;;   CYSTOSCOPY N/A 04/04/2013   Procedure: CYSTOSCOPY WITH LITHALOPAXY;  Surgeon: Sebastian Ache, MD;  Location: [facility];   Service: Urology;  Laterality: N/A;   INCISION AND DRAINAGE ABSCESS N/A 12/25/2022   Procedure: INCISION AND DRAINAGE OF SCROTUM;  Surgeon: Despina Arias, MD;  Location: [facility];  Service: Urology;  Laterality: N/A;   INSERTION OF SUPRAPUBIC CATHETER N/A 04/04/2013   Procedure: INSERTION OF SUPRAPUBIC CATHETER;  Surgeon: Sebastian Ache, MD;  Location: [facility];  Service: Urology;  Laterality: N/A;   LAPAROTOMY  07/20/2011   Procedure: EXPLORATORY LAPAROTOMY;  Surgeon: Rulon Abide, DO;  Location: [facility] OR;  Service: General;  Laterality: N/A;   myeloma thoracic T8 with parpaplegia s/p thoracotomy and thoracic T7-9 cage placement on Dec 26th 2011  07/18/10   SCROTAL EXPLORATION N/A 12/29/2022   Procedure: SCROTAL WOUND DEBRIDEMENT AND CLOSURE;  Surgeon: Loletta Parish., MD;  Location: [facility];  Service: Urology;  Laterality: N/A;    SOCIAL HISTORY: Social History   Socioeconomic History   Marital status: Married    Spouse name: Not on file   Number of children: Not on file   Years of education: Not on file   Highest education level: Not on file  Occupational History   Not on file  Tobacco Use   Smoking status: Never  Smokeless tobacco: Never  Vaping Use   Vaping status: Never Used  Substance and Sexual Activity   Alcohol use: No   Drug use: No   Sexual activity: Never  Other Topics Concern   Not on file  Social History Narrative   Not on file   Social Determinants of Health   Financial Resource Strain: Not on file  Food Insecurity: No Food Insecurity (12/22/2022)   Hunger Vital Sign    Worried About Running Out of Food in the Last Year: Never true    Ran Out of Food in the Last Year: Never true  Transportation Needs: No Transportation Needs (12/22/2022)   PRAPARE - Administrator, Civil Service (Medical): No    Lack of Transportation (Non-Medical): No  Physical Activity: Not on file  Stress: Not on file  Social Connections: Not on file  Intimate Partner  Violence: Not At Risk (12/22/2022)   Humiliation, Afraid, Rape, and Kick questionnaire    Fear of Current or Ex-Partner: No    Emotionally Abused: No    Physically Abused: No    Sexually Abused: No    FAMILY HISTORY: Family History  Problem Relation Age of Onset   Ovarian cancer Mother    Diabetes Father     ALLERGIES:  is allergic to ferumoxytol.  MEDICATIONS:  Current Outpatient Medications  Medication Sig Dispense Refill   acetaminophen (TYLENOL) 325 MG tablet Take 325-650 mg by mouth every 6 (six) hours as needed for headache or mild pain.     acetic acid 0.25 % irrigation Irrigate with 1 Application as directed See admin instructions. Instill 50 ml's, clamp for 10 minutes, then remove clamp and drain the bladder. Repeat 3 times a week- when not using the Renacidin irrigation     acyclovir (ZOVIRAX) 400 MG tablet Take 1 tablet (400 mg total) by mouth 2 (two) times daily. 60 tablet 2   albuterol (VENTOLIN HFA) 108 (90 Base) MCG/ACT inhaler Inhale 2 puffs into the lungs every 4 (four) hours as needed for wheezing or shortness of breath.     atorvastatin (LIPITOR) 20 MG tablet Take 20 mg by mouth daily.     BENADRYL ALLERGY 25 MG tablet Take 25 mg by mouth every 6 (six) hours as needed (for allergic reactions).     BIOFREEZE 4 % GEL Apply 1 application  topically every 4 (four) hours as needed (for joint pain- to intact areas of the skin only).     Citric Ac-Gluconolact-Mg Carb (RENACIDIN IR) Irrigate with 30 mLs as directed See admin instructions. Instill 30 ml's, clamp for 10 minutes, then remove clamp and drain the bladder. Repeat 3 times a week- when not using the acetic acid irrigation     COSOPT PF 2-0.5 % SOLN Place 1 drop into both eyes 2 (two) times daily.     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     Emollient (EUCERIN EX) Apply 1 application  topically See admin instructions. Apply to  affected area 2 times a day     FLOVENT HFA 110 MCG/ACT inhaler 2 each See admin instructions. 2 sprays to colostomy stoma prior to each colostomy bag change     iron polysaccharides (NU-IRON) 150 MG capsule Take 1 capsule (150 mg total) by mouth daily. 30 capsule 0   latanoprost (XALATAN) 0.005 % ophthalmic solution Place 1 drop into both eyes at bedtime.  Multiple Vitamin (MULTIVITAMIN WITH MINERALS) TABS tablet Take 1 tablet by mouth daily. 30 tablet 0   NON FORMULARY Apply 1 application  topically See admin instructions. Colo Plast paste- Apply as directed with each ostomy bag change     ondansetron (ZOFRAN) 4 MG tablet Take 1 tablet (4 mg) by mouth every 6 hours as needed for nausea. 20 tablet 0   rivaroxaban (XARELTO) 10 MG TABS tablet Take 1 tablet (10 mg total) by mouth daily with supper. (Patient taking differently: Take 10 mg by mouth daily.) 30 tablet 9   Skin Protectants, Misc. (MINERIN CREME) CREA Apply to affected areas 2 times daily as needed. 454 g 0   Zinc Oxide (BALMEX EX) Apply 1 application  topically See admin instructions. Apply topically daily to affected sites     No current facility-administered medications for this visit.   Facility-Administered Medications Ordered in Other Visits  Medication Dose Route Frequency Provider Last Rate Last Admin   sodium chloride flush (NS) 0.9 % injection 10 mL  10 mL Intravenous PRN Bertis Ruddy, Ni, MD        REVIEW OF SYSTEMS:   Constitutional: ( - ) fevers, ( - )  chills , ( - ) night sweats Eyes: ( - ) blurriness of vision, ( - ) double vision, ( - ) watery eyes Ears, nose, mouth, throat, and face: ( - ) mucositis, ( - ) sore throat Respiratory: ( - ) cough, ( - ) dyspnea, ( - ) wheezes Cardiovascular: ( - ) palpitation, ( - ) chest discomfort, ( - ) lower extremity swelling Gastrointestinal:  ( - ) nausea, ( - ) heartburn, ( - ) change in bowel habits Skin: ( - ) abnormal skin rashes Lymphatics: ( - ) new lymphadenopathy, ( - )  easy bruising Neurological: ( - ) numbness, ( - ) tingling, ( - ) new weaknesses Behavioral/Psych: ( - ) mood change, ( - ) new changes  All other systems were reviewed with the patient and are negative.  PHYSICAL EXAMINATION: ECOG PERFORMANCE STATUS: paraplegic.   Vitals:   04/27/23 1050  BP: 131/73  Pulse: 70  Resp: 17  Temp: 97.7 F (36.5 C)  SpO2: 100%     Filed Weights     GENERAL: well appearing middle aged [place] American male alert, no distress and comfortable SKIN: skin color, texture, turgor are normal, no rashes or significant lesions EYES: conjunctiva are pink and non-injected, sclera clear LUNGS: clear to auscultation and percussion with normal breathing effort HEART: regular rate & rhythm and no murmurs Musculoskeletal: no cyanosis of digits and no clubbing  PSYCH: alert & oriented x 3, fluent speech NEURO: paraplegic, no use of LE bilaterally.   LABORATORY DATA:  I have reviewed the data as listed    Latest Ref Rng & Units 04/27/2023   10:24 AM 04/20/2023    8:33 AM 04/13/2023    8:16 AM  CBC  WBC 4.0 - 10.5 K/uL 8.7  8.8  8.0   Hemoglobin 13.0 - 17.0 g/dL 54.0  08.6  76.1   Hematocrit 39.0 - 52.0 % 33.8  33.2  34.7   Platelets 150 - 400 K/uL 177  208  439        Latest Ref Rng & Units 04/27/2023   10:24 AM 04/20/2023    8:52 AM 04/13/2023    8:16 AM  CMP  Glucose 70 - 99 mg/dL 98  950  932   BUN 8 - 23 mg/dL 18  13  15   Creatinine 0.61 - 1.24 mg/dL 9.56  2.13  0.86   Sodium 135 - 145 mmol/L 143  144  143   Potassium 3.5 - 5.1 mmol/L 3.7  4.4  4.2   Chloride 98 - 111 mmol/L 109  111  109   CO2 22 - 32 mmol/L 27  28  27    Calcium 8.9 - 10.3 mg/dL 9.7  9.5  9.6   Total Protein 6.5 - 8.1 g/dL 6.5  6.3  7.0   Total Bilirubin 0.3 - 1.2 mg/dL 1.1  0.7  0.7   Alkaline Phos 38 - 126 U/L 99  98  96   AST 15 - 41 U/L 14  12  12    ALT 0 - 44 U/L 23  20  14      Lab Results  Component Value Date   MPROTEIN 0.3 (H) 04/13/2023   MPROTEIN 0.2 (H)  03/16/2023   MPROTEIN 0.3 (H) 01/26/2023   Lab Results  Component Value Date   KPAFRELGTCHN 13.1 04/13/2023   KPAFRELGTCHN 21.2 (H) 03/16/2023   KPAFRELGTCHN 37.4 (H) 01/26/2023   LAMBDASER 10.3 04/13/2023   LAMBDASER 13.1 03/16/2023   LAMBDASER 23.1 01/26/2023   KAPLAMBRATIO 1.27 04/13/2023   KAPLAMBRATIO 1.62 03/16/2023   KAPLAMBRATIO 1.62 01/26/2023    RADIOGRAPHIC STUDIES: No results found.  ASSESSMENT & PLAN TALVIN CHRISTIANSON 67 y.o. male with medical history significant for  IgG Lambda Multiple Myeloma who presents for a follow up visit.  # IgG Lambda Multiple Myeloma, Relapsed (ISS Stage II) --findings are most consistent with relapsed multiple myeloma. Patient previously successfully treated with Velcade/Rev/Dex and Daratumumab/Velcade/Dex. On 07/02/2020 he transitioned to monthly daratumumab alone.  --due to to rise in M protein, switched to Kyprolis and Dexamethasone on 01/06/2022 Plan: --Labs show white blood cell count 8.7, hemoglobin 11.3, MCV 78.8, and platelets of 177, Creatinine and LFTs in range.  --Most recent myeloma labs from 04/13/2023 showed M protein of 0.3 with normal serum free light chains.  --Continue with weekly labs plus Kyprolis/Dex treatment as scheduled  #Sepsis 2/2 UTI and Fournier's gangrene of scrotum: --Hospitalized from 531/2024-01/08/2023. Underwent debridement of necrotic tissue. --Given PIP tazobactam and Zyvox initially --Urine culture reveals Proteus mirabilis which is resistant to ciprofloxacin and nitrofurantoin --Blood cultures negative --D/C on remainder doxycycline and Augmentin which he completed on 01/07/2023.  --Evaluated by urology on 7/2 and ID on 7/3 with continued wound healing and no further d/c or evidence of infection.   #History of DVT --He had placement of IVC filter, remains on Xarelto. --Due to poor mobility, and lack of bleeding complications, recommend to continue on Xarelto indefinitely. --caution if Plt count were to  drop <50  #Snoring/Risk for OSA: --Diagnosed with OSA and has CPAP machine.   # Supportive Care -- provided patient with an albuterol inhaler (for use with daratumumab) --acyclovir 400mg  BID for VZV prophylaxis --zofran 8mg  q8H PRN for nausea/vomiting  --Zometa is being held in the setting of his prior episode of osteonecrosis of the jaw  No orders of the defined types were placed in this encounter.   All questions were answered. The patient knows to call the clinic with any problems, questions or concerns.  I have spent a total of 30 minutes minutes of face-to-face and non-face-to-face time, preparing to see the patient,  performing a medically appropriate examination, counseling and educating the patient, communicating with other health care professionals, documenting clinical information in the electronic health record,  and care coordination.  Georga Kaufmann PA-C Dept of Hematology and Oncology [facility] Phone: [phone]    04/27/2023 11:38 AM   Literature Support:  Milagros Loll, Petrucci MT, [place], [place], [place], [place], Spada S, [place], Ponticelli E, [place], Cavo M, Di Toritto TC, Haydee Salter F, Montefusco V, Palumbo A, Boccadoro M, Larocca A. Once-weekly versus twice-weekly carfilzomib in patients with newly diagnosed multiple myeloma: a pooled analysis of two phase I/II studies. Haematologica. 2019 Aug;104(8):1640-1647.  --Once-weekly 70 mg/m2 carfilzomib as induction and maintenance therapy for newly diagnosed multiple myeloma patients was as safe and effective as twice-weekly 36 mg/m2 carfilzomib and provided a more convenient schedule.

## 2023-05-03 ENCOUNTER — Ambulatory Visit: Payer: Medicare (Managed Care) | Admitting: Adult Health

## 2023-05-03 ENCOUNTER — Encounter: Payer: Self-pay | Admitting: Adult Health

## 2023-05-03 VITALS — BP 120/76 | HR 99 | Ht 71.0 in

## 2023-05-03 DIAGNOSIS — G4733 Obstructive sleep apnea (adult) (pediatric): Secondary | ICD-10-CM | POA: Diagnosis not present

## 2023-05-03 NOTE — Assessment & Plan Note (Signed)
Excellent control and compliance on CPAP.  Will continue on current settings.  Patient does have some skin irritation with small sore on the bridge of his nose.  Will need to change CPAP mask.  Would recommend changing to a fullface DreamWear mask.  As this will not go over his bridge of his nose.  Plan  Patient Instructions  Wear CPAP At bedtime, wear all night long for at least 6hr or more .  Healthy sleep regimen  Work on healthy weight.  Change to Dream wear full face mask.  Keep sore on nose clean and dry , use neosporin daily . Call back if not healed up in 2 weeks or getting worse.  Follow up in 1 year with Dr. Wynona Neat  and As needed

## 2023-05-03 NOTE — Patient Instructions (Addendum)
Wear CPAP At bedtime, wear all night long for at least 6hr or more .  Healthy sleep regimen  Work on healthy weight.  Change to Dream wear full face mask.  Keep sore on nose clean and dry , use neosporin daily . Call back if not healed up in 2 weeks or getting worse.  Follow up in 1 year with Dr. Wynona Neat  and As needed  (30 min slot )

## 2023-05-03 NOTE — Addendum Note (Signed)
Addended by: Carnella Guadalajara on: 05/03/2023 10:54 AM   Modules accepted: Orders

## 2023-05-03 NOTE — Progress Notes (Signed)
@Patient  ID: [name], male    DOB: 09-16-1955, 67 y.o.   MRN: [id number]  Chief Complaint  Patient presents with   Follow-up    Referring provider: Jethro Bastos, MD  HPI: 67 year old male seen for sleep consult September 21, 2022 for daytime sleepiness found to have severe sleep apnea Medical history significant for IgG lambda multiple myeloma, relapsed undergoing active therapy with oncology.  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  Patient has underlying severe sleep apnea.  Patient was seen for sleep consult February 2024 with snoring, daytime sleepiness and witnessed apneic events.  He was set up for sleep study done March 2024 that showed severe sleep apnea.  He has been started on CPAP.  Patient says he is doing well on CPAP with perceived benefit improved sleep and decreased daytime sleepiness.  CPAP download shows excellent compliance with daily average usage at 9 hours.  AHI 4.2.  Daily average pressure at 9 cm H2O.  Patient is using a fullface mask, it has irritated the bridge of his nose.  He has a small sore that will not heal up.  We discussed changing to a different fullface mask such as the DreamWear full face so that does not go over the bridge of his nose. As above patient has a history of paraplegia since 2012 complicated by neurogenic bowel and bladder.  Requires [facility] lift for transfer.  [facility]  When he is ready okay thank you so much    Allergies  Allergen Reactions   Ferumoxytol Nausea And Vomiting and Other (See Comments)    The patient felt like he was going to  pass out and had chills also     Immunization History  Administered Date(s) Administered   Influenza,inj,Quad PF,6+ Mos 04/20/2017   Influenza-Unspecified 04/23/2014, 05/03/2015, 04/23/2022   Td 04/23/2002   Zoster Recombinant(Shingrix) 03/04/2020    Past Medical History:  Diagnosis Date   Adrenal insufficiency (HCC)    on chronic dexamethasone   Anemia    Cancer (HCC)    Coagulopathy (HCC)    on xeralto/ s/p DVT while on coumadin,  IVC in place   Diabetes mellitus without complication (HCC)    type 2   Glaucoma 12/22/2022   Gross hematuria 01/2013   post foley cath procedure   History of blood transfusion 01/2013   Lower GI bleeding 04/18/2017   Multiple myeloma    thoracic T8 with paraplegia s/p resection- on chemo at visit 10/13/10   Multiple myeloma    Multiple myeloma without mention of remission    Neurogenic bladder    Neurogenic bowel    OSA (obstructive sleep apnea) 11/01/2022   Paraplegia (HCC)    Partial small bowel obstruction (HCC) during dec 2011 admission    Tobacco History: Social History   Tobacco Use  Smoking Status Never  Smokeless Tobacco Never   Counseling given: Not Answered   Outpatient Medications Prior to Visit  Medication Sig Dispense Refill   acetaminophen (TYLENOL) 325 MG tablet Take 325-650 mg by mouth every 6 (six) hours as needed for headache  or mild pain.     acetic acid 0.25 % irrigation Irrigate with 1 Application as directed See admin instructions. Instill 50 ml's, clamp for 10 minutes, then remove clamp and drain the bladder. Repeat 3 times a week- when not using the Renacidin irrigation     acyclovir (ZOVIRAX) 400 MG tablet Take 1 tablet (400 mg total) by mouth 2 (two) times daily. 60 tablet 2   albuterol (VENTOLIN HFA) 108 (90 Base) MCG/ACT inhaler Inhale 2 puffs into the lungs every 4 (four) hours as needed for wheezing or shortness of breath.     atorvastatin (LIPITOR) 20 MG tablet Take 20 mg by mouth daily.     BENADRYL  ALLERGY 25 MG tablet Take 25 mg by mouth every 6 (six) hours as needed (for allergic reactions).     BIOFREEZE 4 % GEL Apply 1 application  topically every 4 (four) hours as needed (for joint pain- to intact areas of the skin only).     Citric Ac-Gluconolact-Mg Carb (RENACIDIN IR) Irrigate with 30 mLs as directed See admin instructions. Instill 30 ml's, clamp for 10 minutes, then remove clamp and drain the bladder. Repeat 3 times a week- when not using the acetic acid irrigation     COSOPT PF 2-0.5 % SOLN Place 1 drop into both eyes 2 (two) times daily.     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     Emollient (EUCERIN EX) Apply 1 application  topically See admin instructions. Apply to affected area 2 times a day     FLOVENT HFA 110 MCG/ACT inhaler 2 each See admin instructions. 2 sprays to colostomy stoma prior to each colostomy bag change     iron polysaccharides (NU-IRON) 150 MG capsule Take 1 capsule (150 mg total) by mouth daily. 30 capsule 0   latanoprost (XALATAN) 0.005 % ophthalmic solution Place 1 drop into both eyes at bedtime.     Multiple Vitamin (MULTIVITAMIN WITH MINERALS) TABS tablet Take 1 tablet by mouth daily. 30 tablet 0   NON FORMULARY Apply 1 application  topically See admin instructions. Colo Plast paste- Apply as directed with each ostomy bag change     ondansetron (ZOFRAN) 4 MG tablet Take 1 tablet (4 mg) by mouth every 6 hours as needed for nausea. 20 tablet 0   rivaroxaban (XARELTO) 10 MG TABS tablet Take 1 tablet (10 mg total) by mouth daily with supper. (Patient taking differently: Take 10 mg by mouth daily.) 30 tablet 9   Skin Protectants, Misc. (MINERIN CREME) CREA Apply to affected areas 2 times daily as needed. 454 g 0   Zinc Oxide (BALMEX EX) Apply 1 application  topically See admin instructions. Apply topically daily to affected sites     Facility-Administered Medications Prior to  Visit  Medication Dose Route Frequency Provider Last Rate Last Admin   sodium chloride flush (NS) 0.9 % injection 10 mL  10 mL Intravenous PRN Bertis Ruddy, Ni, MD         Review of Systems:   Constitutional:   No  weight loss, night sweats,  Fevers, chills, + fatigue, or  lassitude.  HEENT:   No headaches,  Difficulty swallowing,  Tooth/dental problems, or  Sore throat,                No sneezing, itching, ear ache, nasal congestion, post nasal drip,   CV:  No chest pain,  Orthopnea, PND, swelling in lower extremities, anasarca, dizziness, palpitations, syncope.   GI  No heartburn, indigestion, abdominal pain, nausea, vomiting, diarrhea, change in bowel habits, loss of appetite, bloody stools.   Resp: No shortness of breath with exertion or at rest.  No excess mucus, no productive cough,  No non-productive cough,  No coughing up of blood.  No change in color of mucus.  No wheezing.  No chest wall deformity  Skin: no rash or lesions.  GU: no dysuria, change in color of urine, no urgency or frequency.  No flank pain, no hematuria   MS:  +paraplegic    Physical Exam  BP 120/76 (BP Location: Right Arm, Cuff Size: Large)   Pulse 99   Ht 5\' 11"  (1.803 m)   SpO2 94%   BMI 34.48 kg/m   GEN: A/Ox3; pleasant , NAD, well nourished , large wheelchair , paraplegic    HEENT:  [place]/AT,  NOSE-clear, THROAT-clear, no lesions, no postnasal drip or exudate noted. Skin abrasion along bridge of nose.   NECK:  Supple w/ fair ROM; no JVD; normal carotid impulses w/o bruits; no thyromegaly or nodules palpated; no lymphadenopathy.    RESP  Clear  P & A; w/o, wheezes/ rales/ or rhonchi. no accessory muscle use, no dullness to percussion  CARD:  RRR, no m/r/g, no peripheral edema, pulses intact, no cyanosis or clubbing.  GI:   Soft & nt; nml bowel sounds; no organomegaly or masses detected. Colostomy  GU: Suprapubic cath   Musco: Warm bil, no deformities or joint swelling noted.   Neuro: alert,  Paraplegic, WC   Skin: Warm, no lesions or rashes    Lab Results:      BNP No results found for: "BNP"  ProBNP No results found for: "PROBNP"  Imaging:       No data to display          No results found for: "NITRICOXIDE"      Assessment & Plan:   OSA (obstructive sleep apnea) Excellent control and compliance on CPAP.  Will continue on current settings.  Patient does have some skin irritation with small sore on the bridge of his nose.  Will need to change CPAP mask.  Would recommend changing to a fullface DreamWear mask.  As this will not go over his bridge of his nose.  Plan  Patient Instructions  Wear CPAP At bedtime, wear all night long for at least 6hr or more .  Healthy sleep regimen  Work on healthy weight.  Change to Dream wear full face mask.  Keep sore on nose clean and dry , use neosporin daily . Call back if not healed up in 2 weeks or getting worse.  Follow up in 1 year with Dr. Wynona Neat  and As needed        Rubye Oaks, NP 05/03/2023

## 2023-05-11 ENCOUNTER — Other Ambulatory Visit: Payer: Medicare (Managed Care)

## 2023-05-11 ENCOUNTER — Ambulatory Visit: Payer: Medicare (Managed Care)

## 2023-05-11 ENCOUNTER — Ambulatory Visit: Payer: Medicare (Managed Care) | Admitting: Physician Assistant

## 2023-05-11 ENCOUNTER — Inpatient Hospital Stay: Payer: Medicare (Managed Care)

## 2023-05-11 ENCOUNTER — Inpatient Hospital Stay (HOSPITAL_BASED_OUTPATIENT_CLINIC_OR_DEPARTMENT_OTHER): Payer: Medicare (Managed Care) | Admitting: Physician Assistant

## 2023-05-11 VITALS — BP 141/66 | HR 69 | Temp 98.0°F | Resp 16

## 2023-05-11 DIAGNOSIS — C9002 Multiple myeloma in relapse: Secondary | ICD-10-CM | POA: Diagnosis not present

## 2023-05-11 DIAGNOSIS — Z5111 Encounter for antineoplastic chemotherapy: Secondary | ICD-10-CM | POA: Diagnosis not present

## 2023-05-11 LAB — CBC WITH DIFFERENTIAL (CANCER CENTER ONLY)
Abs Immature Granulocytes: 0.04 10*3/uL (ref 0.00–0.07)
Basophils Absolute: 0.1 10*3/uL (ref 0.0–0.1)
Basophils Relative: 1 %
Eosinophils Absolute: 0.3 10*3/uL (ref 0.0–0.5)
Eosinophils Relative: 5 %
HCT: 32.6 % — ABNORMAL LOW (ref 39.0–52.0)
Hemoglobin: 10.8 g/dL — ABNORMAL LOW (ref 13.0–17.0)
Immature Granulocytes: 1 %
Lymphocytes Relative: 14 %
Lymphs Abs: 0.9 10*3/uL (ref 0.7–4.0)
MCH: 27.1 pg (ref 26.0–34.0)
MCHC: 33.1 g/dL (ref 30.0–36.0)
MCV: 81.7 fL (ref 80.0–100.0)
Monocytes Absolute: 0.7 10*3/uL (ref 0.1–1.0)
Monocytes Relative: 10 %
Neutro Abs: 4.6 10*3/uL (ref 1.7–7.7)
Neutrophils Relative %: 69 %
Platelet Count: 404 10*3/uL — ABNORMAL HIGH (ref 150–400)
RBC: 3.99 MIL/uL — ABNORMAL LOW (ref 4.22–5.81)
RDW: 20.3 % — ABNORMAL HIGH (ref 11.5–15.5)
WBC Count: 6.5 10*3/uL (ref 4.0–10.5)
nRBC: 0 % (ref 0.0–0.2)

## 2023-05-11 LAB — CMP (CANCER CENTER ONLY)
ALT: 17 U/L (ref 0–44)
AST: 13 U/L — ABNORMAL LOW (ref 15–41)
Albumin: 3.8 g/dL (ref 3.5–5.0)
Alkaline Phosphatase: 94 U/L (ref 38–126)
Anion gap: 6 (ref 5–15)
BUN: 9 mg/dL (ref 8–23)
CO2: 27 mmol/L (ref 22–32)
Calcium: 9.3 mg/dL (ref 8.9–10.3)
Chloride: 112 mmol/L — ABNORMAL HIGH (ref 98–111)
Creatinine: 0.66 mg/dL (ref 0.61–1.24)
GFR, Estimated: >60 mL/min (ref >60)
Glucose, Bld: 106 mg/dL — ABNORMAL HIGH (ref 70–99)
Potassium: 3.6 mmol/L (ref 3.5–5.1)
Sodium: 145 mmol/L (ref 135–145)
Total Bilirubin: 0.6 mg/dL (ref 0.3–1.2)
Total Protein: 6.6 g/dL (ref 6.5–8.1)

## 2023-05-11 MED ORDER — SODIUM CHLORIDE 0.9 % IV SOLN: Freq: Once | INTRAVENOUS | Status: AC

## 2023-05-11 MED ORDER — DEXTROSE 5 % IV SOLN
70.0000 mg/m2 | Freq: Once | INTRAVENOUS | Status: AC
  Administered 2023-05-11: 150 mg via INTRAVENOUS
  Filled 2023-05-11: qty 60

## 2023-05-11 MED ORDER — SODIUM CHLORIDE 0.9 % IV SOLN
40.0000 mg | Freq: Once | INTRAVENOUS | Status: AC
  Administered 2023-05-11: 40 mg via INTRAVENOUS
  Filled 2023-05-11: qty 4

## 2023-05-11 NOTE — Patient Instructions (Signed)
[facility]   Discharge Instructions: Thank you for choosing [facility] to provide your oncology and hematology care.   If you have a lab appointment with the Cancer Center, please go directly to the Cancer Center and check in at the registration area.   Wear comfortable clothing and clothing appropriate for easy access to any Portacath or PICC line.   We strive to give you quality time with your provider. You may need to reschedule your appointment if you arrive late (15 or more minutes).  Arriving late affects you and other patients whose appointments are after yours.  Also, if you miss three or more appointments without notifying the office, you may be dismissed from the clinic at the provider's discretion.      For prescription refill requests, have your pharmacy contact our office and allow 72 hours for refills to be completed.    Today you received the following chemotherapy and/or immunotherapy agents: Carfilzomib (Kyprolis)       To help prevent nausea and vomiting after your treatment, we encourage you to take your nausea medication as directed.  BELOW ARE SYMPTOMS THAT SHOULD BE REPORTED IMMEDIATELY: *FEVER GREATER THAN 100.4 F (38 C) OR HIGHER *CHILLS OR SWEATING *NAUSEA AND VOMITING THAT IS NOT CONTROLLED WITH YOUR NAUSEA MEDICATION *UNUSUAL SHORTNESS OF BREATH *UNUSUAL BRUISING OR BLEEDING *URINARY PROBLEMS (pain or burning when urinating, or frequent urination) *BOWEL PROBLEMS (unusual diarrhea, constipation, pain near the anus) TENDERNESS IN MOUTH AND THROAT WITH OR WITHOUT PRESENCE OF ULCERS (sore throat, sores in mouth, or a toothache) UNUSUAL RASH, SWELLING OR PAIN  UNUSUAL VAGINAL DISCHARGE OR ITCHING   Items with * indicate a potential emergency and should be followed up as soon as possible or go to the Emergency Department if any problems should occur.  Please show the CHEMOTHERAPY ALERT CARD or IMMUNOTHERAPY  ALERT CARD at check-in to the Emergency Department and triage nurse.  Should you have questions after your visit or need to cancel or reschedule your appointment, please contact [facility]  Dept: [phone]  and follow the prompts.  Office hours are 8:00 a.m. to 4:30 p.m. Monday - Friday. Please note that voicemails left after 4:00 p.m. may not be returned until the following business day.  We are closed weekends and major holidays. You have access to a nurse at all times for urgent questions. Please call the main number to the clinic Dept: [phone] and follow the prompts.   For any non-urgent questions, you may also contact your provider using MyChart. We now offer e-Visits for anyone 18 and older to request care online for non-urgent symptoms. For details visit [email or web address].   Also download the MyChart app! Go to the app store, search "MyChart", open the app, select [facility], and log in with your MyChart username and password. 

## 2023-05-13 ENCOUNTER — Encounter: Payer: Self-pay | Admitting: Hematology and Oncology

## 2023-05-13 NOTE — Progress Notes (Signed)
[facility] [phone]) [phone]   [phone]  PROGRESS NOTE  Patient Care Team: Jethro Bastos, MD as PCP - General (Family Medicine) Ranelle Oyster, MD as Consulting Physician (Physical Medicine and Rehabilitation) Hildred Alamin Mcarthur Rossetti, PA-C as Physician Assistant (Physical Medicine and Rehabilitation) Jaci Standard, MD as Consulting Physician (Hematology and Oncology) Jaci Standard, MD as Consulting Physician (Hematology and Oncology) Jaci Standard, MD as Consulting Physician (Hematology and Oncology)  Hematological/Oncological History # IgG Lambda Multiple Myeloma, Relapsed (ISS Stage II) 1) 06/2010: initial diagnosis of Multiple Myeloma after T8 compression fracture. Treated with Velcade/Revlimid/Dexamethasone and achieved a complete remission 2) Velcade was discontinued in September 2012 and that Revlimid and Decadron were discontinued in March 2013. 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 4) Followed by Dr. Bertis Ruddy, last clinic visit 10/09/2019. At that time there was concern for relapse of his multiple myeloma.  5) Patient requested transfer to different provider after misunderstanding regarding imaging studies 6) 12/17/2019: transfer care to Dr. Leonides Schanz  7) 01/09/2020: Cycle 1 Day 1 of Dara/Velcade/Dex 8) 01/21/2020: presented as urgent visit for diarrhea and dehydration. Holding chemotherapy scheduled for 01/23/2020. 9) 01/30/2020: Resume dara/velcade/dex after resolution of diarrhea.  10) 02/13/2020: restaging labs show M protein 0.8, Kappa 4.5, lamba 17.2, ratio 0.26, urine M protein 53 (7.1%). All MM labs indicate improvement.  11) 03/10/2020: Cycle 4 Day 1 of Dara/Velcade/Dex. Transition to q 3 week daratumumab.  12) 06/04/2020:  Cycle 8 Day 1 of Dara/Velcade/Dex 13) 06/25/2020:  Cycle 9 Day 1 of Dara/Velcade/Dex 14) 07/22/2020: Cycle 10 Day 1  of  Dara//Dex 15) 08/18/2020: Cycle 11 Day 1 of Dara//Dex 16) 09/23/2020: Cycle 12 Day 1 of Dara//Dex 17) 10/22/2020: Cycle 13 Day 1 of Dara/Dex  18) 11/19/2020: Cycle 14 Day 1 of Dara/Dex  19) 12/17/2020: Cycle 15 Day 1 of Dara/Dex  20) 01/17/2021: Cycle 16 Day 1 of Dara/Dex  21) 02/11/2021: Cycle 17 Day 1 of Dara/Dex 22) 03/10/2021: Cycle 18 Day 1 of Dara/Dex  23) 04/08/2021: Cycle 19 Day 1 of Dara/Dex  24) 08/03/2021: Cycle 20 Day 1 of Dara/Dex (delayed due to scheduling error) 25) 09/02/2021: Cycle 21 Day 1 of Dara/Dex 26) 09/30/2021: Cycle 22 Day 1 of Dara/Dex 27) 10/28/2021: Cycle 23 Day 1 of Dara/Dex 28) 12/02/2021: Cycle 24 Day 1 of Dara/Dex 29) 12/30/2021: Cycle 25 Day 1 of Dara/Dex 30) 01/06/2022: Cycle 1 Day 1 of Kyprolis/Dex 31) 02/03/2022: Cycle 2 Day 1 of Kyprolis/Dex 32) 02/24/2022: Cycle 3 Day 1 of Kyprolis/Dex 33) 04/07/2022: Cycle 4 Day 1 of Kyprolis/Dex 34) 05/05/2022: Cycle 5 Day 1 of Kyprolis/Dex 35) 06/01/2022: Cycle 6 Day 1 of Kyprolis/Dex 36) 06/29/2022: Cycle 7 Day 1 of Kyprolis/Dex 37) 07/28/2022: Cycle 8 Day 1 of Kyprolis/Dex 38) 08/25/2022: Cycle 9 Day 1 of Kyprolis/Dex 39) 09/22/2022: Cycle 10 Day 1 of Kyprolis/Dex 40) 10/20/2022: Cycle 11 Day 1 of Kyprolis/Dex 41) 11/17/2022: Cycle 12 Day 1 of Kyprolis/Dex 42 ) 12/22/2022-01/08/2023: Admitted for fournier's gangrene of scrotum.  43) 01/26/2023: Cycle 13 Day 1 of Kyprolis/Dex 44) 02/16/2023: Cycle 14 Day 1 of Kyprolis/Dex 45) 03/16/2023:Cycle 15 Day 1 of Kyprolis/Dex 46) 04/13/2023: Cycle 16 Day 1 of Kyprolis/Dex 47) 05/11/2023: Cycle 17 Day 1 of Kyprolis/Dex  Interval History:  [name] 67 y.o. male with medical history significant for  IgG Lambda Multiple Myeloma who presents for a follow up visit. The patient's last  visit was on 04/27/2023.  He presents today prior to treatment with Kyprolis/Dex. He is unaccompanied for this visit.   On exam today [name] reports overall he is doing well. His energy and appetite are stable.  He feels that he has noticed some short term memory loss. He denies nausea, vomiting or abdominal pain. He has good output from his colostomy and good urinary output. He denies easy bruising or any signs of bleeding. He denies fevers, chills, night sweats, shortness of breath, chest pain or cough.  He has no other complaints.  Rest of the 10 point ROS is below.  MEDICAL HISTORY:  Past Medical History:  Diagnosis Date   Adrenal insufficiency (HCC)    on chronic dexamethasone   Anemia    Cancer (HCC)    Coagulopathy (HCC)    on xeralto/ s/p DVT while on coumadin,  IVC in place   Diabetes mellitus without complication (HCC)    type 2   Glaucoma 12/22/2022   Gross hematuria 01/2013   post foley cath procedure   History of blood transfusion 01/2013   Lower GI bleeding 04/18/2017   Multiple myeloma    thoracic T8 with paraplegia s/p resection- on chemo at visit 10/13/10   Multiple myeloma    Multiple myeloma without mention of remission    Neurogenic bladder    Neurogenic bowel    OSA (obstructive sleep apnea) 11/01/2022   Paraplegia (HCC)    Partial small bowel obstruction (HCC) during dec 2011 admission    SURGICAL HISTORY: Past Surgical History:  Procedure Laterality Date   COLONOSCOPY WITH PROPOFOL N/A 04/12/2017   Procedure: COLONOSCOPY WITH PROPOFOL;  Surgeon: Hilarie Fredrickson, MD;  Location: [facility];  Service: Endoscopy;  Laterality: N/A;   COLONOSCOPY WITH PROPOFOL N/A 04/19/2017   Procedure: COLONOSCOPY WITH PROPOFOL;  Surgeon: Benancio Deeds, MD;  Location: [facility];  Service: Gastroenterology;  Laterality: N/A;   COLOSTOMY  07/20/2011   Procedure: COLOSTOMY;  Surgeon: Rulon Abide, DO;  Location: [facility] OR;  Service: General;;   COLOSTOMY REVISION  07/20/2011   Procedure: COLON RESECTION SIGMOID;  Surgeon: Rulon Abide, DO;  Location: [facility] OR;  Service: General;;   CYSTOSCOPY N/A 04/04/2013   Procedure: CYSTOSCOPY WITH LITHALOPAXY;  Surgeon: Sebastian Ache,  MD;  Location: [facility];  Service: Urology;  Laterality: N/A;   INCISION AND DRAINAGE ABSCESS N/A 12/25/2022   Procedure: INCISION AND DRAINAGE OF SCROTUM;  Surgeon: Despina Arias, MD;  Location: [facility];  Service: Urology;  Laterality: N/A;   INSERTION OF SUPRAPUBIC CATHETER N/A 04/04/2013   Procedure: INSERTION OF SUPRAPUBIC CATHETER;  Surgeon: Sebastian Ache, MD;  Location: [facility];  Service: Urology;  Laterality: N/A;   LAPAROTOMY  07/20/2011   Procedure: EXPLORATORY LAPAROTOMY;  Surgeon: Rulon Abide, DO;  Location: [facility] OR;  Service: General;  Laterality: N/A;   myeloma thoracic T8 with parpaplegia s/p thoracotomy and thoracic T7-9 cage placement on Dec 26th 2011  07/18/10   SCROTAL EXPLORATION N/A 12/29/2022   Procedure: SCROTAL WOUND DEBRIDEMENT AND CLOSURE;  Surgeon: Loletta Parish., MD;  Location: [facility];  Service: Urology;  Laterality: N/A;    SOCIAL HISTORY: Social History   Socioeconomic History   Marital status: Married    Spouse name: Not on file   Number of children: Not on file   Years of education: Not on file   Highest education level: Not on file  Occupational History   Not on file  Tobacco Use  Smoking status: Never   Smokeless tobacco: Never  Vaping Use   Vaping status: Never Used  Substance and Sexual Activity   Alcohol use: No   Drug use: No   Sexual activity: Never  Other Topics Concern   Not on file  Social History Narrative   Not on file   Social Determinants of Health   Financial Resource Strain: Not on file  Food Insecurity: No Food Insecurity (12/22/2022)   Hunger Vital Sign    Worried About Running Out of Food in the Last Year: Never true    Ran Out of Food in the Last Year: Never true  Transportation Needs: No Transportation Needs (12/22/2022)   PRAPARE - Administrator, Civil Service (Medical): No    Lack of Transportation (Non-Medical): No  Physical Activity: Not on file  Stress: Not on file  Social Connections: Not on  file  Intimate Partner Violence: Not At Risk (12/22/2022)   Humiliation, Afraid, Rape, and Kick questionnaire    Fear of Current or Ex-Partner: No    Emotionally Abused: No    Physically Abused: No    Sexually Abused: No    FAMILY HISTORY: Family History  Problem Relation Age of Onset   Ovarian cancer Mother    Diabetes Father     ALLERGIES:  is allergic to ferumoxytol.  MEDICATIONS:  Current Outpatient Medications  Medication Sig Dispense Refill   acetaminophen (TYLENOL) 325 MG tablet Take 325-650 mg by mouth every 6 (six) hours as needed for headache or mild pain.     acetic acid 0.25 % irrigation Irrigate with 1 Application as directed See admin instructions. Instill 50 ml's, clamp for 10 minutes, then remove clamp and drain the bladder. Repeat 3 times a week- when not using the Renacidin irrigation     acyclovir (ZOVIRAX) 400 MG tablet Take 1 tablet (400 mg total) by mouth 2 (two) times daily. 60 tablet 2   albuterol (VENTOLIN HFA) 108 (90 Base) MCG/ACT inhaler Inhale 2 puffs into the lungs every 4 (four) hours as needed for wheezing or shortness of breath.     atorvastatin (LIPITOR) 20 MG tablet Take 20 mg by mouth daily.     BENADRYL ALLERGY 25 MG tablet Take 25 mg by mouth every 6 (six) hours as needed (for allergic reactions).     BIOFREEZE 4 % GEL Apply 1 application  topically every 4 (four) hours as needed (for joint pain- to intact areas of the skin only).     Citric Ac-Gluconolact-Mg Carb (RENACIDIN IR) Irrigate with 30 mLs as directed See admin instructions. Instill 30 ml's, clamp for 10 minutes, then remove clamp and drain the bladder. Repeat 3 times a week- when not using the acetic acid irrigation     COSOPT PF 2-0.5 % SOLN Place 1 drop into both eyes 2 (two) times daily.     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     Emollient (EUCERIN EX) Apply 1 application  topically See admin  instructions. Apply to affected area 2 times a day     FLOVENT HFA 110 MCG/ACT inhaler 2 each See admin instructions. 2 sprays to colostomy stoma prior to each colostomy bag change     iron polysaccharides (NU-IRON) 150 MG capsule Take 1 capsule (150 mg total) by mouth daily. 30 capsule 0   latanoprost (XALATAN) 0.005 % ophthalmic solution Place 1 drop into  both eyes at bedtime.     Multiple Vitamin (MULTIVITAMIN WITH MINERALS) TABS tablet Take 1 tablet by mouth daily. 30 tablet 0   NON FORMULARY Apply 1 application  topically See admin instructions. Colo Plast paste- Apply as directed with each ostomy bag change     ondansetron (ZOFRAN) 4 MG tablet Take 1 tablet (4 mg) by mouth every 6 hours as needed for nausea. 20 tablet 0   rivaroxaban (XARELTO) 10 MG TABS tablet Take 1 tablet (10 mg total) by mouth daily with supper. (Patient taking differently: Take 10 mg by mouth daily.) 30 tablet 9   Skin Protectants, Misc. (MINERIN CREME) CREA Apply to affected areas 2 times daily as needed. 454 g 0   Zinc Oxide (BALMEX EX) Apply 1 application  topically See admin instructions. Apply topically daily to affected sites     No current facility-administered medications for this visit.   Facility-Administered Medications Ordered in Other Visits  Medication Dose Route Frequency Provider Last Rate Last Admin   sodium chloride flush (NS) 0.9 % injection 10 mL  10 mL Intravenous PRN Bertis Ruddy, Ni, MD        REVIEW OF SYSTEMS:   Constitutional: ( - ) fevers, ( - )  chills , ( - ) night sweats Eyes: ( - ) blurriness of vision, ( - ) double vision, ( - ) watery eyes Ears, nose, mouth, throat, and face: ( - ) mucositis, ( - ) sore throat Respiratory: ( - ) cough, ( - ) dyspnea, ( - ) wheezes Cardiovascular: ( - ) palpitation, ( - ) chest discomfort, ( - ) lower extremity swelling Gastrointestinal:  ( - ) nausea, ( - ) heartburn, ( - ) change in bowel habits Skin: ( - ) abnormal skin rashes Lymphatics: ( - ) new  lymphadenopathy, ( - ) easy bruising Neurological: ( - ) numbness, ( - ) tingling, ( - ) new weaknesses Behavioral/Psych: ( - ) mood change, ( - ) new changes  All other systems were reviewed with the patient and are negative.  PHYSICAL EXAMINATION: ECOG PERFORMANCE STATUS: paraplegic.   There were no vitals filed for this visit.  There were no vitals filed for this visit.   Day 1, Cycle 17 05/11/23  Temp 98 F (36.7 C)  Temp src Oral  Pulse 69  Resp 16  BP 141/66 !  Note: Showing the most recent values for these dates. There are additional values that can be seen in Synopsis. !: Data is abnormal  GENERAL: well appearing middle aged [place] American male alert, no distress and comfortable SKIN: skin color, texture, turgor are normal, no rashes or significant lesions EYES: conjunctiva are pink and non-injected, sclera clear LUNGS: clear to auscultation and percussion with normal breathing effort HEART: regular rate & rhythm and no murmurs Musculoskeletal: no cyanosis of digits and no clubbing  PSYCH: alert & oriented x 3, fluent speech NEURO: paraplegic, no use of LE bilaterally.   LABORATORY DATA:  I have reviewed the data as listed    Latest Ref Rng & Units 05/11/2023    9:57 AM 04/27/2023   10:24 AM 04/20/2023    8:33 AM  CBC  WBC 4.0 - 10.5 K/uL 6.5  8.7  8.8   Hemoglobin 13.0 - 17.0 g/dL 10.2  72.5  36.6   Hematocrit 39.0 - 52.0 % 32.6  33.8  33.2   Platelets 150 - 400 K/uL 404  177  208  Latest Ref Rng & Units 05/11/2023    9:57 AM 04/27/2023   10:24 AM 04/20/2023    8:52 AM  CMP  Glucose 70 - 99 mg/dL 409  98  811   BUN 8 - 23 mg/dL 9  18  13    Creatinine 0.61 - 1.24 mg/dL 9.14  7.82  9.56   Sodium 135 - 145 mmol/L 145  143  144   Potassium 3.5 - 5.1 mmol/L 3.6  3.7  4.4   Chloride 98 - 111 mmol/L 112  109  111   CO2 22 - 32 mmol/L 27  27  28    Calcium 8.9 - 10.3 mg/dL 9.3  9.7  9.5   Total Protein 6.5 - 8.1 g/dL 6.6  6.5  6.3   Total Bilirubin  0.3 - 1.2 mg/dL 0.6  1.1  0.7   Alkaline Phos 38 - 126 U/L 94  99  98   AST 15 - 41 U/L 13  14  12    ALT 0 - 44 U/L 17  23  20      Lab Results  Component Value Date   MPROTEIN 0.3 (H) 04/13/2023   MPROTEIN 0.2 (H) 03/16/2023   MPROTEIN 0.3 (H) 01/26/2023   Lab Results  Component Value Date   KPAFRELGTCHN 13.1 04/13/2023   KPAFRELGTCHN 21.2 (H) 03/16/2023   KPAFRELGTCHN 37.4 (H) 01/26/2023   LAMBDASER 10.3 04/13/2023   LAMBDASER 13.1 03/16/2023   LAMBDASER 23.1 01/26/2023   KAPLAMBRATIO 1.27 04/13/2023   KAPLAMBRATIO 1.62 03/16/2023   KAPLAMBRATIO 1.62 01/26/2023    RADIOGRAPHIC STUDIES: No results found.  ASSESSMENT & PLAN [name] 67 y.o. male with medical history significant for  IgG Lambda Multiple Myeloma who presents for a follow up visit.  # IgG Lambda Multiple Myeloma, Relapsed (ISS Stage II) --findings are most consistent with relapsed multiple myeloma. Patient previously successfully treated with Velcade/Rev/Dex and Daratumumab/Velcade/Dex. On 07/02/2020 he transitioned to monthly daratumumab alone.  --due to to rise in M protein, switched to Kyprolis and Dexamethasone on 01/06/2022 Plan: --Due for Cycle 17, Day 1 of Kyprolis/Dex today  --Labs show white blood cell count 6.5, hemoglobin 10.8, MCV 81.7, and platelets of 404, Creatinine and LFTs in range.  --Most recent myeloma labs from 04/13/2023 showed M protein of 0.3 with normal serum free light chains.  --Proceed with treatment today without any dose modifications. --Continue with weekly labs plus Kyprolis/Dex treatment as scheduled  #Memory loss: --Encouraged to do memory exercises including puzzles, suduko, etc --If symptoms worsen, we can make a referral to neurology for further evaluation.   #History of Sepsis 2/2 UTI and Fournier's gangrene of scrotum: --Hospitalized from 531/2024-01/08/2023. Underwent debridement of necrotic tissue. --Given PIP tazobactam and Zyvox initially --Urine culture  reveals Proteus mirabilis which is resistant to ciprofloxacin and nitrofurantoin --Blood cultures negative --D/C on remainder doxycycline and Augmentin which he completed on 01/07/2023.  --Evaluated by urology on 7/2 and ID on 7/3 with continued wound healing and no further d/c or evidence of infection.   #History of DVT --He had placement of IVC filter, remains on Xarelto. --Due to poor mobility, and lack of bleeding complications, recommend to continue on Xarelto indefinitely. --caution if Plt count were to drop <50  #Snoring/Risk for OSA: --Diagnosed with OSA and has CPAP machine.   # Supportive Care -- provided patient with an albuterol inhaler (for use with daratumumab) --acyclovir 400mg  BID for VZV prophylaxis --zofran 8mg  q8H PRN for nausea/vomiting  --Zometa is being held in the  setting of his prior episode of osteonecrosis of the jaw  No orders of the defined types were placed in this encounter.   All questions were answered. The patient knows to call the clinic with any problems, questions or concerns.  I have spent a total of 30 minutes minutes of face-to-face and non-face-to-face time, preparing to see the patient,  performing a medically appropriate examination, counseling and educating the patient, communicating with other health care professionals, documenting clinical information in the electronic health record,  and care coordination.    Georga Kaufmann PA-C Dept of Hematology and Oncology [facility] Phone: [phone]    05/13/2023 8:29 AM   Literature Support:  Milagros Loll, Petrucci MT, [place], [place], [place], [place], Spada S, [place], Ponticelli E, [place], Cavo M, Di Toritto TC, Haydee Salter F, [place] V, Palumbo A, Boccadoro M, Larocca A. Once-weekly versus twice-weekly carfilzomib in patients with newly diagnosed multiple myeloma: a pooled analysis of two phase I/II studies. Haematologica. 2019  Aug;104(8):1640-1647.  --Once-weekly 70 mg/m2 carfilzomib as induction and maintenance therapy for newly diagnosed multiple myeloma patients was as safe and effective as twice-weekly 36 mg/m2 carfilzomib and provided a more convenient schedule.

## 2023-05-14 LAB — KAPPA/LAMBDA LIGHT CHAINS
Kappa free light chain: 14.2 mg/L (ref 3.3–19.4)
Kappa, lambda light chain ratio: 1.3 (ref 0.26–1.65)
Lambda free light chains: 10.9 mg/L (ref 5.7–26.3)

## 2023-05-15 LAB — MULTIPLE MYELOMA PANEL, SERUM
Albumin SerPl Elph-Mcnc: 3.3 g/dL (ref 2.9–4.4)
Albumin/Glob SerPl: 1.3 (ref 0.7–1.7)
Alpha 1: 0.3 g/dL (ref 0.0–0.4)
Alpha2 Glob SerPl Elph-Mcnc: 0.8 g/dL (ref 0.4–1.0)
B-Globulin SerPl Elph-Mcnc: 0.9 g/dL (ref 0.7–1.3)
Gamma Glob SerPl Elph-Mcnc: 0.7 g/dL (ref 0.4–1.8)
Globulin, Total: 2.6 g/dL (ref 2.2–3.9)
IgA: 62 mg/dL (ref 61–437)
IgG (Immunoglobin G), Serum: 851 mg/dL (ref 603–1613)
IgM (Immunoglobulin M), Srm: 29 mg/dL (ref 20–172)
M Protein SerPl Elph-Mcnc: 0.3 g/dL — ABNORMAL HIGH
Total Protein ELP: 5.9 g/dL — ABNORMAL LOW (ref 6.0–8.5)

## 2023-05-18 ENCOUNTER — Inpatient Hospital Stay: Payer: Medicare (Managed Care)

## 2023-05-18 ENCOUNTER — Other Ambulatory Visit: Payer: Medicare (Managed Care)

## 2023-05-18 ENCOUNTER — Ambulatory Visit: Payer: Medicare (Managed Care)

## 2023-05-18 ENCOUNTER — Other Ambulatory Visit: Payer: Self-pay

## 2023-05-18 VITALS — BP 119/70 | HR 88 | Temp 97.8°F | Resp 18 | Wt 249.0 lb

## 2023-05-18 DIAGNOSIS — Z5111 Encounter for antineoplastic chemotherapy: Secondary | ICD-10-CM | POA: Diagnosis not present

## 2023-05-18 DIAGNOSIS — C9002 Multiple myeloma in relapse: Secondary | ICD-10-CM

## 2023-05-18 LAB — CMP (CANCER CENTER ONLY)
ALT: 19 U/L (ref 0–44)
AST: 12 U/L — ABNORMAL LOW (ref 15–41)
Albumin: 3.6 g/dL (ref 3.5–5.0)
Alkaline Phosphatase: 92 U/L (ref 38–126)
Anion gap: 6 (ref 5–15)
BUN: 17 mg/dL (ref 8–23)
CO2: 26 mmol/L (ref 22–32)
Calcium: 9.4 mg/dL (ref 8.9–10.3)
Chloride: 110 mmol/L (ref 98–111)
Creatinine: 0.9 mg/dL (ref 0.61–1.24)
GFR, Estimated: >60 mL/min (ref >60)
Glucose, Bld: 162 mg/dL — ABNORMAL HIGH (ref 70–99)
Potassium: 3.7 mmol/L (ref 3.5–5.1)
Sodium: 142 mmol/L (ref 135–145)
Total Bilirubin: 0.7 mg/dL (ref 0.3–1.2)
Total Protein: 6.2 g/dL — ABNORMAL LOW (ref 6.5–8.1)

## 2023-05-18 LAB — CBC WITH DIFFERENTIAL (CANCER CENTER ONLY)
Abs Immature Granulocytes: 0.04 10*3/uL (ref 0.00–0.07)
Basophils Absolute: 0 10*3/uL (ref 0.0–0.1)
Basophils Relative: 0 %
Eosinophils Absolute: 0.3 10*3/uL (ref 0.0–0.5)
Eosinophils Relative: 4 %
HCT: 33.6 % — ABNORMAL LOW (ref 39.0–52.0)
Hemoglobin: 11.2 g/dL — ABNORMAL LOW (ref 13.0–17.0)
Immature Granulocytes: 1 %
Lymphocytes Relative: 9 %
Lymphs Abs: 0.8 10*3/uL (ref 0.7–4.0)
MCH: 27.3 pg (ref 26.0–34.0)
MCHC: 33.3 g/dL (ref 30.0–36.0)
MCV: 81.8 fL (ref 80.0–100.0)
Monocytes Absolute: 0.8 10*3/uL (ref 0.1–1.0)
Monocytes Relative: 10 %
Neutro Abs: 6.3 10*3/uL (ref 1.7–7.7)
Neutrophils Relative %: 76 %
Platelet Count: 182 10*3/uL (ref 150–400)
RBC: 4.11 MIL/uL — ABNORMAL LOW (ref 4.22–5.81)
RDW: 19.7 % — ABNORMAL HIGH (ref 11.5–15.5)
WBC Count: 8.3 10*3/uL (ref 4.0–10.5)
nRBC: 0 % (ref 0.0–0.2)

## 2023-05-18 MED ORDER — SODIUM CHLORIDE 0.9 % IV SOLN: Freq: Once | INTRAVENOUS | Status: AC

## 2023-05-18 MED ORDER — DEXTROSE 5 % IV SOLN
70.0000 mg/m2 | Freq: Once | INTRAVENOUS | Status: AC
  Administered 2023-05-18: 150 mg via INTRAVENOUS
  Filled 2023-05-18: qty 60

## 2023-05-18 MED ORDER — SODIUM CHLORIDE 0.9 % IV SOLN
40.0000 mg | Freq: Once | INTRAVENOUS | Status: AC
  Administered 2023-05-18: 40 mg via INTRAVENOUS
  Filled 2023-05-18: qty 4

## 2023-05-18 NOTE — Patient Instructions (Signed)
[facility]  Discharge Instructions: Thank you for choosing [facility] to provide your oncology and hematology care.   If you have a lab appointment with the Cancer Center, please go directly to the Cancer Center and check in at the registration area.   Wear comfortable clothing and clothing appropriate for easy access to any Portacath or PICC line.   We strive to give you quality time with your provider. You may need to reschedule your appointment if you arrive late (15 or more minutes).  Arriving late affects you and other patients whose appointments are after yours.  Also, if you miss three or more appointments without notifying the office, you may be dismissed from the clinic at the provider's discretion.      For prescription refill requests, have your pharmacy contact our office and allow 72 hours for refills to be completed.    Today you received the following chemotherapy and/or immunotherapy agents :  Kyprolis   To help prevent nausea and vomiting after your treatment, we encourage you to take your nausea medication as directed.  BELOW ARE SYMPTOMS THAT SHOULD BE REPORTED IMMEDIATELY: *FEVER GREATER THAN 100.4 F (38 C) OR HIGHER *CHILLS OR SWEATING *NAUSEA AND VOMITING THAT IS NOT CONTROLLED WITH YOUR NAUSEA MEDICATION *UNUSUAL SHORTNESS OF BREATH *UNUSUAL BRUISING OR BLEEDING *URINARY PROBLEMS (pain or burning when urinating, or frequent urination) *BOWEL PROBLEMS (unusual diarrhea, constipation, pain near the anus) TENDERNESS IN MOUTH AND THROAT WITH OR WITHOUT PRESENCE OF ULCERS (sore throat, sores in mouth, or a toothache) UNUSUAL RASH, SWELLING OR PAIN  UNUSUAL VAGINAL DISCHARGE OR ITCHING   Items with * indicate a potential emergency and should be followed up as soon as possible or go to the Emergency Department if any problems should occur.  Please show the CHEMOTHERAPY ALERT CARD or IMMUNOTHERAPY ALERT CARD at  check-in to the Emergency Department and triage nurse.  Should you have questions after your visit or need to cancel or reschedule your appointment, please contact [facility]  Dept: [phone]  and follow the prompts.  Office hours are 8:00 a.m. to 4:30 p.m. Monday - Friday. Please note that voicemails left after 4:00 p.m. may not be returned until the following business day.  We are closed weekends and major holidays. You have access to a nurse at all times for urgent questions. Please call the main number to the clinic Dept: [phone] and follow the prompts.   For any non-urgent questions, you may also contact your provider using MyChart. We now offer e-Visits for anyone 18 and older to request care online for non-urgent symptoms. For details visit [email or web address].   Also download the MyChart app! Go to the app store, search "MyChart", open the app, select [facility], and log in with your MyChart username and password.   

## 2023-05-25 ENCOUNTER — Other Ambulatory Visit: Payer: Medicare (Managed Care)

## 2023-05-25 ENCOUNTER — Inpatient Hospital Stay: Payer: Medicare (Managed Care) | Attending: Physician Assistant

## 2023-05-25 ENCOUNTER — Ambulatory Visit: Payer: Medicare (Managed Care)

## 2023-05-25 ENCOUNTER — Inpatient Hospital Stay: Payer: Medicare (Managed Care)

## 2023-05-25 VITALS — BP 117/76 | HR 83 | Temp 98.2°F | Resp 18

## 2023-05-25 DIAGNOSIS — Z86718 Personal history of other venous thrombosis and embolism: Secondary | ICD-10-CM | POA: Insufficient documentation

## 2023-05-25 DIAGNOSIS — Z5111 Encounter for antineoplastic chemotherapy: Secondary | ICD-10-CM | POA: Diagnosis present

## 2023-05-25 DIAGNOSIS — Z7901 Long term (current) use of anticoagulants: Secondary | ICD-10-CM | POA: Diagnosis not present

## 2023-05-25 DIAGNOSIS — C9002 Multiple myeloma in relapse: Secondary | ICD-10-CM

## 2023-05-25 LAB — CBC WITH DIFFERENTIAL (CANCER CENTER ONLY)
Abs Immature Granulocytes: 0.04 10*3/uL (ref 0.00–0.07)
Basophils Absolute: 0 10*3/uL (ref 0.0–0.1)
Basophils Relative: 1 %
Eosinophils Absolute: 0.3 10*3/uL (ref 0.0–0.5)
Eosinophils Relative: 4 %
HCT: 31.6 % — ABNORMAL LOW (ref 39.0–52.0)
Hemoglobin: 10.9 g/dL — ABNORMAL LOW (ref 13.0–17.0)
Immature Granulocytes: 1 %
Lymphocytes Relative: 14 %
Lymphs Abs: 1.1 10*3/uL (ref 0.7–4.0)
MCH: 28 pg (ref 26.0–34.0)
MCHC: 34.5 g/dL (ref 30.0–36.0)
MCV: 81.2 fL (ref 80.0–100.0)
Monocytes Absolute: 0.9 10*3/uL (ref 0.1–1.0)
Monocytes Relative: 11 %
Neutro Abs: 5.7 10*3/uL (ref 1.7–7.7)
Neutrophils Relative %: 69 %
Platelet Count: 202 10*3/uL (ref 150–400)
RBC: 3.89 MIL/uL — ABNORMAL LOW (ref 4.22–5.81)
RDW: 18.9 % — ABNORMAL HIGH (ref 11.5–15.5)
WBC Count: 8.1 10*3/uL (ref 4.0–10.5)
nRBC: 0 % (ref 0.0–0.2)

## 2023-05-25 LAB — CMP (CANCER CENTER ONLY)
ALT: 22 U/L (ref 0–44)
AST: 11 U/L — ABNORMAL LOW (ref 15–41)
Albumin: 3.6 g/dL (ref 3.5–5.0)
Alkaline Phosphatase: 95 U/L (ref 38–126)
Anion gap: 6 (ref 5–15)
BUN: 17 mg/dL (ref 8–23)
CO2: 25 mmol/L (ref 22–32)
Calcium: 9.6 mg/dL (ref 8.9–10.3)
Chloride: 111 mmol/L (ref 98–111)
Creatinine: 0.73 mg/dL (ref 0.61–1.24)
GFR, Estimated: >60 mL/min (ref >60)
Glucose, Bld: 162 mg/dL — ABNORMAL HIGH (ref 70–99)
Potassium: 3.7 mmol/L (ref 3.5–5.1)
Sodium: 142 mmol/L (ref 135–145)
Total Bilirubin: 0.5 mg/dL (ref 0.3–1.2)
Total Protein: 6.3 g/dL — ABNORMAL LOW (ref 6.5–8.1)

## 2023-05-25 MED ORDER — SODIUM CHLORIDE 0.9 % IV SOLN: Freq: Once | INTRAVENOUS | Status: AC

## 2023-05-25 MED ORDER — SODIUM CHLORIDE 0.9 % IV SOLN
40.0000 mg | Freq: Once | INTRAVENOUS | Status: AC
  Administered 2023-05-25: 40 mg via INTRAVENOUS
  Filled 2023-05-25: qty 4

## 2023-05-25 MED ORDER — CARFILZOMIB CHEMO INJECTION 60 MG
70.0000 mg/m2 | Freq: Once | INTRAVENOUS | Status: AC
  Administered 2023-05-25: 150 mg via INTRAVENOUS
  Filled 2023-05-25: qty 60

## 2023-05-25 NOTE — Patient Instructions (Signed)
[facility]   Discharge Instructions: Thank you for choosing [facility] to provide your oncology and hematology care.   If you have a lab appointment with the Cancer Center, please go directly to the Cancer Center and check in at the registration area.   Wear comfortable clothing and clothing appropriate for easy access to any Portacath or PICC line.   We strive to give you quality time with your provider. You may need to reschedule your appointment if you arrive late (15 or more minutes).  Arriving late affects you and other patients whose appointments are after yours.  Also, if you miss three or more appointments without notifying the office, you may be dismissed from the clinic at the provider's discretion.      For prescription refill requests, have your pharmacy contact our office and allow 72 hours for refills to be completed.    Today you received the following chemotherapy and/or immunotherapy agents: Carfilzomib (Kyprolis)       To help prevent nausea and vomiting after your treatment, we encourage you to take your nausea medication as directed.  BELOW ARE SYMPTOMS THAT SHOULD BE REPORTED IMMEDIATELY: *FEVER GREATER THAN 100.4 F (38 C) OR HIGHER *CHILLS OR SWEATING *NAUSEA AND VOMITING THAT IS NOT CONTROLLED WITH YOUR NAUSEA MEDICATION *UNUSUAL SHORTNESS OF BREATH *UNUSUAL BRUISING OR BLEEDING *URINARY PROBLEMS (pain or burning when urinating, or frequent urination) *BOWEL PROBLEMS (unusual diarrhea, constipation, pain near the anus) TENDERNESS IN MOUTH AND THROAT WITH OR WITHOUT PRESENCE OF ULCERS (sore throat, sores in mouth, or a toothache) UNUSUAL RASH, SWELLING OR PAIN  UNUSUAL VAGINAL DISCHARGE OR ITCHING   Items with * indicate a potential emergency and should be followed up as soon as possible or go to the Emergency Department if any problems should occur.  Please show the CHEMOTHERAPY ALERT CARD or IMMUNOTHERAPY  ALERT CARD at check-in to the Emergency Department and triage nurse.  Should you have questions after your visit or need to cancel or reschedule your appointment, please contact [facility]  Dept: [phone]  and follow the prompts.  Office hours are 8:00 a.m. to 4:30 p.m. Monday - Friday. Please note that voicemails left after 4:00 p.m. may not be returned until the following business day.  We are closed weekends and major holidays. You have access to a nurse at all times for urgent questions. Please call the main number to the clinic Dept: [phone] and follow the prompts.   For any non-urgent questions, you may also contact your provider using MyChart. We now offer e-Visits for anyone 18 and older to request care online for non-urgent symptoms. For details visit [email or web address].   Also download the MyChart app! Go to the app store, search "MyChart", open the app, select [facility], and log in with your MyChart username and password. 

## 2023-06-02 ENCOUNTER — Encounter: Payer: Self-pay | Admitting: Hematology and Oncology

## 2023-06-06 ENCOUNTER — Encounter: Payer: Self-pay | Admitting: Hematology and Oncology

## 2023-06-07 ENCOUNTER — Inpatient Hospital Stay: Payer: Medicare (Managed Care) | Admitting: Hematology and Oncology

## 2023-06-07 ENCOUNTER — Inpatient Hospital Stay: Payer: Medicare (Managed Care)

## 2023-06-07 VITALS — BP 137/87 | HR 86 | Temp 97.8°F | Resp 15 | Wt 246.0 lb

## 2023-06-07 DIAGNOSIS — C9002 Multiple myeloma in relapse: Secondary | ICD-10-CM | POA: Diagnosis not present

## 2023-06-07 DIAGNOSIS — Z5111 Encounter for antineoplastic chemotherapy: Secondary | ICD-10-CM | POA: Diagnosis not present

## 2023-06-07 LAB — CBC WITH DIFFERENTIAL (CANCER CENTER ONLY)
Abs Immature Granulocytes: 0.04 10*3/uL (ref 0.00–0.07)
Basophils Absolute: 0.1 10*3/uL (ref 0.0–0.1)
Basophils Relative: 1 %
Eosinophils Absolute: 0.4 10*3/uL (ref 0.0–0.5)
Eosinophils Relative: 6 %
HCT: 35.5 % — ABNORMAL LOW (ref 39.0–52.0)
Hemoglobin: 11.8 g/dL — ABNORMAL LOW (ref 13.0–17.0)
Immature Granulocytes: 1 %
Lymphocytes Relative: 14 %
Lymphs Abs: 1 10*3/uL (ref 0.7–4.0)
MCH: 27.4 pg (ref 26.0–34.0)
MCHC: 33.2 g/dL (ref 30.0–36.0)
MCV: 82.4 fL (ref 80.0–100.0)
Monocytes Absolute: 0.7 10*3/uL (ref 0.1–1.0)
Monocytes Relative: 10 %
Neutro Abs: 4.5 10*3/uL (ref 1.7–7.7)
Neutrophils Relative %: 68 %
Platelet Count: 409 10*3/uL — ABNORMAL HIGH (ref 150–400)
RBC: 4.31 MIL/uL (ref 4.22–5.81)
RDW: 17.5 % — ABNORMAL HIGH (ref 11.5–15.5)
WBC Count: 6.7 10*3/uL (ref 4.0–10.5)
nRBC: 0 % (ref 0.0–0.2)

## 2023-06-07 LAB — CMP (CANCER CENTER ONLY)
ALT: 14 U/L (ref 0–44)
AST: 13 U/L — ABNORMAL LOW (ref 15–41)
Albumin: 3.8 g/dL (ref 3.5–5.0)
Alkaline Phosphatase: 96 U/L (ref 38–126)
Anion gap: 6 (ref 5–15)
BUN: 12 mg/dL (ref 8–23)
CO2: 28 mmol/L (ref 22–32)
Calcium: 9.4 mg/dL (ref 8.9–10.3)
Chloride: 111 mmol/L (ref 98–111)
Creatinine: 0.71 mg/dL (ref 0.61–1.24)
GFR, Estimated: >60 mL/min (ref >60)
Glucose, Bld: 110 mg/dL — ABNORMAL HIGH (ref 70–99)
Potassium: 3.9 mmol/L (ref 3.5–5.1)
Sodium: 145 mmol/L (ref 135–145)
Total Bilirubin: 0.5 mg/dL (ref <1.2)
Total Protein: 6.7 g/dL (ref 6.5–8.1)

## 2023-06-07 MED ORDER — HEPARIN SOD (PORK) LOCK FLUSH 100 UNIT/ML IV SOLN
500.0000 [IU] | Freq: Once | INTRAVENOUS | Status: DC | PRN
Start: 2023-06-07 — End: 2023-06-07

## 2023-06-07 MED ORDER — SODIUM CHLORIDE 0.9 % IV SOLN: Freq: Once | INTRAVENOUS | Status: AC

## 2023-06-07 MED ORDER — SODIUM CHLORIDE 0.9 % IV SOLN
40.0000 mg | Freq: Once | INTRAVENOUS | Status: AC
  Administered 2023-06-07: 40 mg via INTRAVENOUS
  Filled 2023-06-07: qty 4

## 2023-06-07 MED ORDER — DEXTROSE 5 % IV SOLN
70.0000 mg/m2 | Freq: Once | INTRAVENOUS | Status: AC
  Administered 2023-06-07: 150 mg via INTRAVENOUS
  Filled 2023-06-07: qty 60

## 2023-06-07 MED ORDER — SODIUM CHLORIDE 0.9% FLUSH: 10.0000 mL | INTRAVENOUS | Status: DC | PRN

## 2023-06-07 NOTE — Progress Notes (Signed)
[facility] [phone]   [phone]  PROGRESS NOTE  Patient Care Team: Gary Bastos, MD as PCP - General (Family Medicine) Gary Oyster, MD as Consulting Physician (Physical Medicine and Rehabilitation) Gary Alamin Mcarthur Rossetti, PA-C as Physician Assistant (Physical Medicine and Rehabilitation) Gary Standard, MD as Consulting Physician (Hematology and Oncology) Gary Standard, MD as Consulting Physician (Hematology and Oncology) Gary Standard, MD as Consulting Physician (Hematology and Oncology)  Hematological/Oncological History # IgG Lambda Multiple Myeloma, Relapsed (ISS Stage II) 1) 06/2010: initial diagnosis of Multiple Myeloma after T8 compression fracture. Treated with Velcade/Revlimid/Dexamethasone and achieved a complete remission 2) Velcade was discontinued in September 2012 and that Revlimid and Decadron were discontinued in March 2013. 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 4) Followed by Dr. Bertis Howell, last clinic visit 10/09/2019. At that time there was concern for relapse of his multiple myeloma.  5) Patient requested transfer to different provider after misunderstanding regarding imaging studies 6) 12/17/2019: transfer care to Dr. Leonides Howell  7) 01/09/2020: Cycle 1 Day 1 of Dara/Velcade/Dex 8) 01/21/2020: presented as urgent visit for diarrhea and dehydration. Holding chemotherapy scheduled for 01/23/2020. 9) 01/30/2020: Resume dara/velcade/dex after resolution of diarrhea.  10) 02/13/2020: restaging labs show M protein 0.8, Kappa 4.5, lamba 17.2, ratio 0.26, urine M protein 53 (7.1%). All MM labs indicate improvement.  11) 03/10/2020: Cycle 4 Day 1 of Dara/Velcade/Dex. Transition to q 3 week daratumumab.  12) 06/04/2020:  Cycle 8 Day 1 of Dara/Velcade/Dex 13) 06/25/2020:  Cycle 9 Day 1 of Dara/Velcade/Dex 14) 07/22/2020: Cycle 10 Day 1  of  Dara//Dex 15) 08/18/2020: Cycle 11 Day 1 of Dara//Dex 16) 09/23/2020: Cycle 12 Day 1 of Dara//Dex 17) 10/22/2020: Cycle 13 Day 1 of Dara/Dex  18) 11/19/2020: Cycle 14 Day 1 of Dara/Dex  19) 12/17/2020: Cycle 15 Day 1 of Dara/Dex  20) 01/17/2021: Cycle 16 Day 1 of Dara/Dex  21) 02/11/2021: Cycle 17 Day 1 of Dara/Dex 22) 03/10/2021: Cycle 18 Day 1 of Dara/Dex  23) 04/08/2021: Cycle 19 Day 1 of Dara/Dex  24) 08/03/2021: Cycle 20 Day 1 of Dara/Dex (delayed due to scheduling error) 25) 09/02/2021: Cycle 21 Day 1 of Dara/Dex 26) 09/30/2021: Cycle 22 Day 1 of Dara/Dex 27) 10/28/2021: Cycle 23 Day 1 of Dara/Dex 28) 12/02/2021: Cycle 24 Day 1 of Dara/Dex 29) 12/30/2021: Cycle 25 Day 1 of Dara/Dex 30) 01/06/2022: Cycle 1 Day 1 of Kyprolis/Dex 31) 02/03/2022: Cycle 2 Day 1 of Kyprolis/Dex 32) 02/24/2022: Cycle 3 Day 1 of Kyprolis/Dex 33) 04/07/2022: Cycle 4 Day 1 of Kyprolis/Dex 34) 05/05/2022: Cycle 5 Day 1 of Kyprolis/Dex 35) 06/01/2022: Cycle 6 Day 1 of Kyprolis/Dex 36) 06/29/2022: Cycle 7 Day 1 of Kyprolis/Dex 37) 07/28/2022: Cycle 8 Day 1 of Kyprolis/Dex 38) 08/25/2022: Cycle 9 Day 1 of Kyprolis/Dex 39) 09/22/2022: Cycle 10 Day 1 of Kyprolis/Dex 40) 10/20/2022: Cycle 11 Day 1 of Kyprolis/Dex 41) 11/17/2022: Cycle 12 Day 1 of Kyprolis/Dex 42 ) 12/22/2022-01/08/2023: Admitted for fournier's gangrene of scrotum.  43) 01/26/2023: Cycle 13 Day 1 of Kyprolis/Dex 44) 02/16/2023: Cycle 14 Day 1 of Kyprolis/Dex 45) 03/16/2023:Cycle 15 Day 1 of Kyprolis/Dex 46) 04/13/2023: Cycle 16 Day 1 of Kyprolis/Dex 47) 05/11/2023: Cycle 17 Day 1 of Kyprolis/Dex 48) 06/07/2023: Cycle 18 Day 1 of Kyprolis/Dex  Interval History:  [name] 67 y.o. male with medical history significant for  IgG Lambda Multiple Myeloma who presents  for a follow up visit. The patient's last visit was on 05/11/2023.  He presents today prior to treatment with Kyprolis/Dex. He is unaccompanied for this visit.   On exam today Mr. [name] reports he has been  well overall in the interim since her last visit.  He recently got a new wheelchair which does recline more completely.  He notes that the brakes of change position and the tires are more smooth.  It is little bit difficult for him to try to drive at this point.  He did recently pinch his right thumb in the chair.  He reports he is tolerating Akai prolotherapy "so-so".  He notes that his weight is increasing due to the steroid therapy and he is disheartened by this.  He reports his energy levels are good though and his appetite is strong.  He is not having any numbness or tingling of the fingers and toes concerning for neuropathy.  He reports he is also not had any infectious symptoms such as runny nose, sore throat, or cough.  Overall he is willing and able to proceed with Kyprolis therapy at this time.  He denies fevers, chills, night sweats, shortness of breath, chest pain or cough.  He has no other complaints.  Rest of the 10 point ROS is below.  MEDICAL HISTORY:  Past Medical History:  Diagnosis Date   Adrenal insufficiency (HCC)    on chronic dexamethasone   Anemia    Cancer (HCC)    Coagulopathy (HCC)    on xeralto/ s/p DVT while on coumadin,  IVC in place   Diabetes mellitus without complication (HCC)    type 2   Glaucoma 12/22/2022   Gross hematuria 01/2013   post foley cath procedure   History of blood transfusion 01/2013   Lower GI bleeding 04/18/2017   Multiple myeloma    thoracic T8 with paraplegia s/p resection- on chemo at visit 10/13/10   Multiple myeloma    Multiple myeloma without mention of remission    Neurogenic bladder    Neurogenic bowel    OSA (obstructive sleep apnea) 11/01/2022   Paraplegia (HCC)    Partial small bowel obstruction (HCC) during dec 2011 admission    SURGICAL HISTORY: Past Surgical History:  Procedure Laterality Date   COLONOSCOPY WITH PROPOFOL N/A 04/12/2017   Procedure: COLONOSCOPY WITH PROPOFOL;  Surgeon: Hilarie Fredrickson, MD;  Location: [facility]  ENDOSCOPY;  Service: Endoscopy;  Laterality: N/A;   COLONOSCOPY WITH PROPOFOL N/A 04/19/2017   Procedure: COLONOSCOPY WITH PROPOFOL;  Surgeon: Benancio Deeds, MD;  Location: [facility] ENDOSCOPY;  Service: Gastroenterology;  Laterality: N/A;   COLOSTOMY  07/20/2011   Procedure: COLOSTOMY;  Surgeon: Rulon Abide, DO;  Location[facility] OR;  Service: General;;   COLOSTOMY REVISION  07/20/2011   Procedure: COLON RESECTION SIGMOID;  Surgeon: Rulon Abide, DO;  Location: [facility] OR;  Service: General;;   CYSTOSCOPY N/A 04/04/2013   Procedure: CYSTOSCOPY WITH LITHALOPAXY;  Surgeon: Sebastian Ache, MD;  Location: [facility] ORS;  Service: Urology;  Laterality: N/A;   INCISION AND DRAINAGE ABSCESS N/A 12/25/2022   Procedure: INCISION AND DRAINAGE OF SCROTUM;  Surgeon: Despina Arias, MD;  Location: [facility] ORS;  Service: Urology;  Laterality: N/A;   INSERTION OF SUPRAPUBIC CATHETER N/A 04/04/2013   Procedure: INSERTION OF SUPRAPUBIC CATHETER;  Surgeon: Sebastian Ache, MD;  Location: [facility] ORS;  Service: Urology;  Laterality: N/A;   LAPAROTOMY  07/20/2011   Procedure: EXPLORATORY LAPAROTOMY;  Surgeon: Rulon Abide, DO;  Location:  [facility] OR;  Service: General;  Laterality: N/A;   myeloma thoracic T8 with parpaplegia s/p thoracotomy and thoracic T7-9 cage placement on Dec 26th 2011  07/18/10   SCROTAL EXPLORATION N/A 12/29/2022   Procedure: SCROTAL WOUND DEBRIDEMENT AND CLOSURE;  Surgeon: Loletta Parish., MD;  Location: [facility] ORS;  Service: Urology;  Laterality: N/A;    SOCIAL HISTORY: Social History   Socioeconomic History   Marital status: Married    Spouse name: Not on file   Number of children: Not on file   Years of education: Not on file   Highest education level: Not on file  Occupational History   Not on file  Tobacco Use   Smoking status: Never   Smokeless tobacco: Never  Vaping Use   Vaping status: Never Used  Substance and Sexual Activity   Alcohol use: No   Drug use: No   Sexual activity:  Never  Other Topics Concern   Not on file  Social History Narrative   Not on file   Social Determinants of Health   Financial Resource Strain: Not on file  Food Insecurity: No Food Insecurity (12/22/2022)   Hunger Vital Sign    Worried About Running Out of Food in the Last Year: Never true    Ran Out of Food in the Last Year: Never true  Transportation Needs: No Transportation Needs (12/22/2022)   PRAPARE - Administrator, Civil Service (Medical): No    Lack of Transportation (Non-Medical): No  Physical Activity: Not on file  Stress: Not on file  Social Connections: Not on file  Intimate Partner Violence: Not At Risk (12/22/2022)   Humiliation, Afraid, Rape, and Kick questionnaire    Fear of Current or Ex-Partner: No    Emotionally Abused: No    Physically Abused: No    Sexually Abused: No    FAMILY HISTORY: Family History  Problem Relation Age of Onset   Ovarian cancer Mother    Diabetes Father     ALLERGIES:  is allergic to ferumoxytol.  MEDICATIONS:  Current Outpatient Medications  Medication Sig Dispense Refill   acetaminophen (TYLENOL) 325 MG tablet Take 325-650 mg by mouth every 6 (six) hours as needed for headache or mild pain.     acetic acid 0.25 % irrigation Irrigate with 1 Application as directed See admin instructions. Instill 50 ml's, clamp for 10 minutes, then remove clamp and drain the bladder. Repeat 3 times a week- when not using the Renacidin irrigation     acyclovir (ZOVIRAX) 400 MG tablet Take 1 tablet (400 mg total) by mouth 2 (two) times daily. 60 tablet 2   albuterol (VENTOLIN HFA) 108 (90 Base) MCG/ACT inhaler Inhale 2 puffs into the lungs every 4 (four) hours as needed for wheezing or shortness of breath.     atorvastatin (LIPITOR) 20 MG tablet Take 20 mg by mouth daily.     BENADRYL ALLERGY 25 MG tablet Take 25 mg by mouth every 6 (six) hours as needed (for allergic reactions).     BIOFREEZE 4 % GEL Apply 1 application  topically every 4  (four) hours as needed (for joint pain- to intact areas of the skin only).     Citric Ac-Gluconolact-Mg Carb (RENACIDIN IR) Irrigate with 30 mLs as directed See admin instructions. Instill 30 ml's, clamp for 10 minutes, then remove clamp and drain the bladder. Repeat 3 times a week- when not using the acetic acid irrigation     COSOPT PF 2-0.5 %  SOLN Place 1 drop into both eyes 2 (two) times daily.     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     Emollient (EUCERIN EX) Apply 1 application  topically See admin instructions. Apply to affected area 2 times a day     FLOVENT HFA 110 MCG/ACT inhaler 2 each See admin instructions. 2 sprays to colostomy stoma prior to each colostomy bag change     iron polysaccharides (NU-IRON) 150 MG capsule Take 1 capsule (150 mg total) by mouth daily. 30 capsule 0   latanoprost (XALATAN) 0.005 % ophthalmic solution Place 1 drop into both eyes at bedtime.     Multiple Vitamin (MULTIVITAMIN WITH MINERALS) TABS tablet Take 1 tablet by mouth daily. 30 tablet 0   NON FORMULARY Apply 1 application  topically See admin instructions. Colo Plast paste- Apply as directed with each ostomy bag change     ondansetron (ZOFRAN) 4 MG tablet Take 1 tablet (4 mg) by mouth every 6 hours as needed for nausea. 20 tablet 0   rivaroxaban (XARELTO) 10 MG TABS tablet Take 1 tablet (10 mg total) by mouth daily with supper. (Patient taking differently: Take 10 mg by mouth daily.) 30 tablet 9   Skin Protectants, Misc. (MINERIN CREME) CREA Apply to affected areas 2 times daily as needed. 454 g 0   Zinc Oxide (BALMEX EX) Apply 1 application  topically See admin instructions. Apply topically daily to affected sites     No current facility-administered medications for this visit.   Facility-Administered Medications Ordered in Other Visits  Medication Dose Route Frequency Provider Last Rate Last Admin   0.9 %  sodium  chloride infusion   Intravenous Once Gary Standard, MD       carfilzomib (KYPROLIS) 150 mg in dextrose 5 % 100 mL chemo infusion  70 mg/m2 (Capped) Intravenous Once Gary Standard, MD       dexamethasone (DECADRON) 40 mg in sodium chloride 0.9 % 50 mL IVPB  40 mg Intravenous Once Ulysees Barns IV, MD 216 mL/hr at 06/07/23 1055 40 mg at 06/07/23 1055   heparin lock flush 100 unit/mL  500 Units Intracatheter Once PRN Gary Standard, MD       sodium chloride flush (NS) 0.9 % injection 10 mL  10 mL Intravenous PRN [name], Ni, MD       sodium chloride flush (NS) 0.9 % injection 10 mL  10 mL Intracatheter PRN Gary Standard, MD        REVIEW OF SYSTEMS:   Constitutional: ( - ) fevers, ( - )  chills , ( - ) night sweats Eyes: ( - ) blurriness of vision, ( - ) double vision, ( - ) watery eyes Ears, nose, mouth, throat, and face: ( - ) mucositis, ( - ) sore throat Respiratory: ( - ) cough, ( - ) dyspnea, ( - ) wheezes Cardiovascular: ( - ) palpitation, ( - ) chest discomfort, ( - ) lower extremity swelling Gastrointestinal:  ( - ) nausea, ( - ) heartburn, ( - ) change in bowel habits Skin: ( - ) abnormal skin rashes Lymphatics: ( - ) new lymphadenopathy, ( - ) easy bruising Neurological: ( - ) numbness, ( - ) tingling, ( - ) new weaknesses Behavioral/Psych: ( - ) mood change, ( - ) new changes  All other systems were reviewed with  the patient and are negative.  PHYSICAL EXAMINATION: ECOG PERFORMANCE STATUS: paraplegic.   Vitals:   06/07/23 1012  BP: 137/87  Pulse: 86  Resp: 15  Temp: 97.8 F (36.6 C)  SpO2: 100%    Filed Weights   06/07/23 1012  Weight: 246 lb (111.6 kg)   GENERAL: well appearing middle aged [place] American male alert, no distress and comfortable SKIN: skin color, texture, turgor are normal, no rashes or significant lesions EYES: conjunctiva are pink and non-injected, sclera clear LUNGS: clear to auscultation and percussion with normal breathing  effort HEART: regular rate & rhythm and no murmurs Musculoskeletal: no cyanosis of digits and no clubbing  PSYCH: alert & oriented x 3, fluent speech NEURO: paraplegic, no use of LE bilaterally.   LABORATORY DATA:  I have reviewed the data as listed    Latest Ref Rng & Units 06/07/2023    9:33 AM 05/25/2023   10:33 AM 05/18/2023    9:55 AM  CBC  WBC 4.0 - 10.5 K/uL 6.7  8.1  8.3   Hemoglobin 13.0 - 17.0 g/dL 91.4  78.2  95.6   Hematocrit 39.0 - 52.0 % 35.5  31.6  33.6   Platelets 150 - 400 K/uL 409  202  182        Latest Ref Rng & Units 06/07/2023    9:33 AM 05/25/2023   10:33 AM 05/18/2023    9:55 AM  CMP  Glucose 70 - 99 mg/dL 213  086  578   BUN 8 - 23 mg/dL 12  17  17    Creatinine 0.61 - 1.24 mg/dL 4.69  6.29  5.28   Sodium 135 - 145 mmol/L 145  142  142   Potassium 3.5 - 5.1 mmol/L 3.9  3.7  3.7   Chloride 98 - 111 mmol/L 111  111  110   CO2 22 - 32 mmol/L 28  25  26    Calcium 8.9 - 10.3 mg/dL 9.4  9.6  9.4   Total Protein 6.5 - 8.1 g/dL 6.7  6.3  6.2   Total Bilirubin <1.2 mg/dL 0.5  0.5  0.7   Alkaline Phos 38 - 126 U/L 96  95  92   AST 15 - 41 U/L 13  11  12    ALT 0 - 44 U/L 14  22  19      Lab Results  Component Value Date   MPROTEIN 0.3 (H) 05/11/2023   MPROTEIN 0.3 (H) 04/13/2023   MPROTEIN 0.2 (H) 03/16/2023   Lab Results  Component Value Date   KPAFRELGTCHN 14.2 05/11/2023   KPAFRELGTCHN 13.1 04/13/2023   KPAFRELGTCHN 21.2 (H) 03/16/2023   LAMBDASER 10.9 05/11/2023   LAMBDASER 10.3 04/13/2023   LAMBDASER 13.1 03/16/2023   KAPLAMBRATIO 1.30 05/11/2023   KAPLAMBRATIO 1.27 04/13/2023   KAPLAMBRATIO 1.62 03/16/2023    RADIOGRAPHIC STUDIES: No results found.  ASSESSMENT & PLAN [name] 67 y.o. male with medical history significant for  IgG Lambda Multiple Myeloma who presents for a follow up visit.  # IgG Lambda Multiple Myeloma, Relapsed (ISS Stage II) --findings are most consistent with relapsed multiple myeloma. Patient previously  successfully treated with Velcade/Rev/Dex and Daratumumab/Velcade/Dex. On 07/02/2020 he transitioned to monthly daratumumab alone.  --due to to rise in M protein, switched to Kyprolis and Dexamethasone on 01/06/2022 Plan: --Due for Cycle 18, Day 1 of Kyprolis/Dex today  --Labs show white blood cell count 6.7, Hgb 11.8, MCV 82.4, Plt 409. Creatinine and LFTs in range.  --  Most recent myeloma labs from 05/11/2023 showed M protein of 0.3 with normal serum free light chains.  --Proceed with treatment today without any dose modifications. --Continue with weekly labs plus Kyprolis/Dex treatment as scheduled  #Memory loss: --Encouraged to do memory exercises including puzzles, suduko, etc --If symptoms worsen, we can make a referral to neurology for further evaluation.   #History of Sepsis 2/2 UTI and Fournier's gangrene of scrotum: --Hospitalized from 531/2024-01/08/2023. Underwent debridement of necrotic tissue. --Given PIP tazobactam and Zyvox initially --Urine culture reveals Proteus mirabilis which is resistant to ciprofloxacin and nitrofurantoin --Blood cultures negative --D/C on remainder doxycycline and Augmentin which he completed on 01/07/2023.  --Evaluated by urology on 7/2 and ID on 7/3 with continued wound healing and no further d/c or evidence of infection.   #History of DVT --He had placement of IVC filter, remains on Xarelto. --Due to poor mobility, and lack of bleeding complications, recommend to continue on Xarelto indefinitely. --caution if Plt count were to drop <50  #Snoring/Risk for OSA: --Diagnosed with OSA and has CPAP machine.   # Supportive Care -- provided patient with an albuterol inhaler (for use with daratumumab) --acyclovir 400mg  BID for VZV prophylaxis --zofran 8mg  q8H PRN for nausea/vomiting  --Zometa is being held in the setting of his prior episode of osteonecrosis of the jaw  No orders of the defined types were placed in this encounter.   All questions  were answered. The patient knows to call the clinic with any problems, questions or concerns.  I have spent a total of 30 minutes minutes of face-to-face and non-face-to-face time, preparing to see the patient,  performing a medically appropriate examination, counseling and educating the patient, communicating with other health care professionals, documenting clinical information in the electronic health record,  and care coordination.    Ulysees Barns, MD Department of Hematology/Oncology [facility] at [facility] Phone: [phone] Pager: [phone] Email: [email or web address]   06/07/2023 11:00 AM   Literature Support:  Milagros Loll, Petrucci MT, Thomas, [place], [place], [place], Spada S, [place], Ponticelli E, [place], Cavo M, Di Toritto TC, Haydee Salter F, Montefusco V, Palumbo A, Boccadoro M, Larocca A. Once-weekly versus twice-weekly carfilzomib in patients with newly diagnosed multiple myeloma: a pooled analysis of two phase I/II studies. Haematologica. 2019 Aug;104(8):1640-1647.  --Once-weekly 70 mg/m2 carfilzomib as induction and maintenance therapy for newly diagnosed multiple myeloma patients was as safe and effective as twice-weekly 36 mg/m2 carfilzomib and provided a more convenient schedule.

## 2023-06-07 NOTE — Patient Instructions (Addendum)
 [facility] [facility] - A DEPT OF [facility]  Discharge Instructions: Thank you for choosing [facility] to provide your oncology and hematology care.   If you have a lab appointment with the Cancer Center, please go directly to the Cancer Center and check in at the registration area.   Wear comfortable clothing and clothing appropriate for easy access to any Portacath or PICC line.   We strive to give you quality time with your provider. You may need to reschedule your appointment if you arrive late (15 or more minutes).  Arriving late affects you and other patients whose appointments are after yours.  Also, if you miss three or more appointments without notifying the office, you may be dismissed from the clinic at the provider's discretion.      For prescription refill requests, have your pharmacy contact our office and allow 72 hours for refills to be completed.    Today you received the following chemotherapy and/or immunotherapy agents: Kyprolis      To help prevent nausea and vomiting after your treatment, we encourage you to take your nausea medication as directed.  BELOW ARE SYMPTOMS THAT SHOULD BE REPORTED IMMEDIATELY: *FEVER GREATER THAN 100.4 F (38 C) OR HIGHER *CHILLS OR SWEATING *NAUSEA AND VOMITING THAT IS NOT CONTROLLED WITH YOUR NAUSEA MEDICATION *UNUSUAL SHORTNESS OF BREATH *UNUSUAL BRUISING OR BLEEDING *URINARY PROBLEMS (pain or burning when urinating, or frequent urination) *BOWEL PROBLEMS (unusual diarrhea, constipation, pain near the anus) TENDERNESS IN MOUTH AND THROAT WITH OR WITHOUT PRESENCE OF ULCERS (sore throat, sores in mouth, or a toothache) UNUSUAL RASH, SWELLING OR PAIN  UNUSUAL VAGINAL DISCHARGE OR ITCHING   Items with * indicate a potential emergency and should be followed up as soon as possible or go to the Emergency Department if any problems should occur.  Please show the CHEMOTHERAPY ALERT CARD or IMMUNOTHERAPY  ALERT CARD at check-in to the Emergency Department and triage nurse.  Should you have questions after your visit or need to cancel or reschedule your appointment, please contact [facility] - A DEPT OF [facility]  Dept: [phone]  and follow the prompts.  Office hours are 8:00 a.m. to 4:30 p.m. Monday - Friday. Please note that voicemails left after 4:00 p.m. may not be returned until the following business day.  We are closed weekends and major holidays. You have access to a nurse at all times for urgent questions. Please call the main number to the clinic Dept: [phone] and follow the prompts.   For any non-urgent questions, you may also contact your provider using MyChart. We now offer e-Visits for anyone 38 and older to request care online for non-urgent symptoms. For details visit [email or web address].   Also download the MyChart app! Go to the app store, search "MyChart", open the app, select [place], and log in with your MyChart username and password.

## 2023-06-08 LAB — KAPPA/LAMBDA LIGHT CHAINS
Kappa free light chain: 11.1 mg/L (ref 3.3–19.4)
Kappa, lambda light chain ratio: 1.68 — ABNORMAL HIGH (ref 0.26–1.65)
Lambda free light chains: 6.6 mg/L (ref 5.7–26.3)

## 2023-06-11 LAB — MULTIPLE MYELOMA PANEL, SERUM
Albumin SerPl Elph-Mcnc: 3.4 g/dL (ref 2.9–4.4)
Albumin/Glob SerPl: 1.3 (ref 0.7–1.7)
Alpha 1: 0.3 g/dL (ref 0.0–0.4)
Alpha2 Glob SerPl Elph-Mcnc: 0.9 g/dL (ref 0.4–1.0)
B-Globulin SerPl Elph-Mcnc: 1 g/dL (ref 0.7–1.3)
Gamma Glob SerPl Elph-Mcnc: 0.6 g/dL (ref 0.4–1.8)
Globulin, Total: 2.8 g/dL (ref 2.2–3.9)
IgA: 67 mg/dL (ref 61–437)
IgG (Immunoglobin G), Serum: 708 mg/dL (ref 603–1613)
IgM (Immunoglobulin M), Srm: 99 mg/dL (ref 20–172)
M Protein SerPl Elph-Mcnc: 0.2 g/dL — ABNORMAL HIGH
Total Protein ELP: 6.2 g/dL (ref 6.0–8.5)

## 2023-06-15 ENCOUNTER — Other Ambulatory Visit: Payer: Medicare (Managed Care)

## 2023-06-15 ENCOUNTER — Inpatient Hospital Stay: Payer: Medicare (Managed Care)

## 2023-06-15 ENCOUNTER — Ambulatory Visit: Payer: Medicare (Managed Care)

## 2023-06-15 VITALS — BP 124/57 | HR 69 | Temp 98.1°F | Resp 16 | Wt 246.0 lb

## 2023-06-15 DIAGNOSIS — C9002 Multiple myeloma in relapse: Secondary | ICD-10-CM

## 2023-06-15 DIAGNOSIS — Z5111 Encounter for antineoplastic chemotherapy: Secondary | ICD-10-CM | POA: Diagnosis not present

## 2023-06-15 LAB — CMP (CANCER CENTER ONLY)
ALT: 15 U/L (ref 0–44)
AST: 12 U/L — ABNORMAL LOW (ref 15–41)
Albumin: 3.7 g/dL (ref 3.5–5.0)
Alkaline Phosphatase: 96 U/L (ref 38–126)
Anion gap: 5 (ref 5–15)
BUN: 11 mg/dL (ref 8–23)
CO2: 30 mmol/L (ref 22–32)
Calcium: 9.6 mg/dL (ref 8.9–10.3)
Chloride: 108 mmol/L (ref 98–111)
Creatinine: 0.73 mg/dL (ref 0.61–1.24)
GFR, Estimated: >60 mL/min (ref >60)
Glucose, Bld: 111 mg/dL — ABNORMAL HIGH (ref 70–99)
Potassium: 4.1 mmol/L (ref 3.5–5.1)
Sodium: 143 mmol/L (ref 135–145)
Total Bilirubin: 0.5 mg/dL (ref <1.2)
Total Protein: 6.3 g/dL — ABNORMAL LOW (ref 6.5–8.1)

## 2023-06-15 LAB — CBC WITH DIFFERENTIAL (CANCER CENTER ONLY)
Abs Immature Granulocytes: 0.04 10*3/uL (ref 0.00–0.07)
Basophils Absolute: 0 10*3/uL (ref 0.0–0.1)
Basophils Relative: 1 %
Eosinophils Absolute: 0.4 10*3/uL (ref 0.0–0.5)
Eosinophils Relative: 6 %
HCT: 34.1 % — ABNORMAL LOW (ref 39.0–52.0)
Hemoglobin: 11.8 g/dL — ABNORMAL LOW (ref 13.0–17.0)
Immature Granulocytes: 1 %
Lymphocytes Relative: 12 %
Lymphs Abs: 0.8 10*3/uL (ref 0.7–4.0)
MCH: 28.4 pg (ref 26.0–34.0)
MCHC: 34.6 g/dL (ref 30.0–36.0)
MCV: 82 fL (ref 80.0–100.0)
Monocytes Absolute: 0.9 10*3/uL (ref 0.1–1.0)
Monocytes Relative: 13 %
Neutro Abs: 4.9 10*3/uL (ref 1.7–7.7)
Neutrophils Relative %: 67 %
Platelet Count: 200 10*3/uL (ref 150–400)
RBC: 4.16 MIL/uL — ABNORMAL LOW (ref 4.22–5.81)
RDW: 17.9 % — ABNORMAL HIGH (ref 11.5–15.5)
WBC Count: 7.1 10*3/uL (ref 4.0–10.5)
nRBC: 0 % (ref 0.0–0.2)

## 2023-06-15 MED ORDER — SODIUM CHLORIDE 0.9 % IV SOLN: Freq: Once | INTRAVENOUS | Status: AC

## 2023-06-15 MED ORDER — SODIUM CHLORIDE 0.9 % IV SOLN
40.0000 mg | Freq: Once | INTRAVENOUS | Status: AC
  Administered 2023-06-15: 40 mg via INTRAVENOUS
  Filled 2023-06-15: qty 4

## 2023-06-15 MED ORDER — CARFILZOMIB CHEMO INJECTION 60 MG
70.0000 mg/m2 | Freq: Once | INTRAVENOUS | Status: AC
  Administered 2023-06-15: 150 mg via INTRAVENOUS
  Filled 2023-06-15: qty 60

## 2023-06-15 NOTE — Patient Instructions (Signed)
 [facility] - A DEPT OF [facility]  Discharge Instructions: Thank you for choosing [facility] to provide your oncology and hematology care.   If you have a lab appointment with the Cancer Center, please go directly to the Cancer Center and check in at the registration area.   Wear comfortable clothing and clothing appropriate for easy access to any Portacath or PICC line.   We strive to give you quality time with your provider. You may need to reschedule your appointment if you arrive late (15 or more minutes).  Arriving late affects you and other patients whose appointments are after yours.  Also, if you miss three or more appointments without notifying the office, you may be dismissed from the clinic at the provider's discretion.      For prescription refill requests, have your pharmacy contact our office and allow 72 hours for refills to be completed.    Today you received the following chemotherapy and/or immunotherapy agents: Kyprolis      To help prevent nausea and vomiting after your treatment, we encourage you to take your nausea medication as directed.  BELOW ARE SYMPTOMS THAT SHOULD BE REPORTED IMMEDIATELY: *FEVER GREATER THAN 100.4 F (38 C) OR HIGHER *CHILLS OR SWEATING *NAUSEA AND VOMITING THAT IS NOT CONTROLLED WITH YOUR NAUSEA MEDICATION *UNUSUAL SHORTNESS OF BREATH *UNUSUAL BRUISING OR BLEEDING *URINARY PROBLEMS (pain or burning when urinating, or frequent urination) *BOWEL PROBLEMS (unusual diarrhea, constipation, pain near the anus) TENDERNESS IN MOUTH AND THROAT WITH OR WITHOUT PRESENCE OF ULCERS (sore throat, sores in mouth, or a toothache) UNUSUAL RASH, SWELLING OR PAIN  UNUSUAL VAGINAL DISCHARGE OR ITCHING   Items with * indicate a potential emergency and should be followed up as soon as possible or go to the Emergency Department if any problems should occur.  Please show the CHEMOTHERAPY ALERT CARD or IMMUNOTHERAPY  ALERT CARD at check-in to the Emergency Department and triage nurse.  Should you have questions after your visit or need to cancel or reschedule your appointment, please contact [facility] - A DEPT OF [facility]  Dept: [phone]  and follow the prompts.  Office hours are 8:00 a.m. to 4:30 p.m. Monday - Friday. Please note that voicemails left after 4:00 p.m. may not be returned until the following business day.  We are closed weekends and major holidays. You have access to a nurse at all times for urgent questions. Please call the main number to the clinic Dept: [phone] and follow the prompts.   For any non-urgent questions, you may also contact your provider using MyChart. We now offer e-Visits for anyone 38 and older to request care online for non-urgent symptoms. For details visit [email or web address].   Also download the MyChart app! Go to the app store, search "MyChart", open the app, select [facility] [place], and log in with your MyChart username and password.

## 2023-06-21 NOTE — Progress Notes (Signed)
Patient Care Team: Jethro Bastos, MD as PCP - General (Family Medicine) Ranelle Oyster, MD as Consulting Physician (Physical Medicine and Rehabilitation) Hildred Alamin Mcarthur Rossetti, PA-C as Physician Assistant (Physical Medicine and Rehabilitation) Jaci Standard, MD as Consulting Physician (Hematology and Oncology) Jaci Standard, MD as Consulting Physician (Hematology and Oncology) Jaci Standard, MD as Consulting Physician (Hematology and Oncology)  Clinic Day:  06/22/2023  Referring physician: Jaci Standard, MD  ASSESSMENT & PLAN:   Assessment & Plan: Multiple myeloma in relapse (HCC)  IgG Lambda Multiple Myeloma, Relapsed (ISS Stage II) --findings are most consistent with relapsed multiple myeloma. Patient previously successfully treated with Velcade/Rev/Dex and Daratumumab/Velcade/Dex. On 07/02/2020 he transitioned to monthly daratumumab alone.  --due to to rise in M protein, switched to Kyprolis and Dexamethasone on 01/06/2022 Plan: --Due for Cycle 18, Day 15 of Kyprolis/Dex today  --Labs show white blood cell count 7.9, Hgb 12.4, MCV 82.4, Plt 142. Creatinine and LFTs in range.  --Most recent myeloma labs from 05/11/2023 showed M protein of 0.3 with normal serum free light chains.  ---Myeloma panel and serum free light chains from today, 06/22/2023, are pending. --Proceed with treatment today without any dose modifications. --Continue with weekly labs plus Kyprolis/Dex treatment as scheduled    Plan: Labs reviewed  -CBC showing WBC 7.9; Hgb 12.4; Hct 36.2; Plt 142; Anc 5.0 -CMP - K 4.4; glucose 111; BUN 18; Creatinine 0.82; eGFR > 60; Ca 9.6; LFTs normal.   Patient seen in infusion today.  Reports feeling very well. Labs adequate for treatment today. Proceed with cycle 18 day 15 of Kyprolis/dexamethasone. Labs/flush, follow-up, and treatment as currently scheduled.  The patient understands the plans discussed today and is in agreement with them.  He knows to  contact our office if he develops concerns prior to his next appointment.  I provided 20 minutes of face-to-face time during this encounter and > 50% was spent counseling as documented under my assessment and plan.    Carlean Jews, NP  [facility] - [facility] DEPT [facility] [address] [place] [address] Dept: [phone] Dept Fax: [phone]   No orders of the defined types were placed in this encounter.     CHIEF COMPLAINT:  CC: multiple myeloma   Current Treatment:  Kyprolis/Dexamethasone   INTERVAL HISTORY:  Mainor is here today for repeat clinical assessment.  He was last seen by Dr. Leonides Schanz on 06/07/2023.  Doing well clinically.  Reports feeling well.  He denies chest pain, chest pressure, or shortness of breath. He denies headaches or visual disturbances. He denies abdominal pain, nausea, vomiting, or changes in bowel or bladder habits.    I have reviewed the past medical history, past surgical history, social history and family history with the patient and they are unchanged from previous note.  ALLERGIES:  is allergic to ferumoxytol.  MEDICATIONS:  Current Outpatient Medications  Medication Sig Dispense Refill   acetaminophen (TYLENOL) 325 MG tablet Take 325-650 mg by mouth every 6 (six) hours as needed for headache or mild pain.     acetic acid 0.25 % irrigation Irrigate with 1 Application as directed See admin instructions. Instill 50 ml's, clamp for 10 minutes, then remove clamp and drain the bladder. Repeat 3 times a week- when not using the Renacidin irrigation     acyclovir (ZOVIRAX) 400 MG tablet Take 1 tablet (400 mg total) by mouth 2 (  two) times daily. 60 tablet 2   albuterol (VENTOLIN HFA) 108 (90 Base) MCG/ACT inhaler Inhale 2 puffs into the lungs every 4 (four) hours as needed for wheezing or shortness of breath.     atorvastatin (LIPITOR) 20 MG tablet Take 20 mg by mouth daily.      BENADRYL ALLERGY 25 MG tablet Take 25 mg by mouth every 6 (six) hours as needed (for allergic reactions).     BIOFREEZE 4 % GEL Apply 1 application  topically every 4 (four) hours as needed (for joint pain- to intact areas of the skin only).     Citric Ac-Gluconolact-Mg Carb (RENACIDIN IR) Irrigate with 30 mLs as directed See admin instructions. Instill 30 ml's, clamp for 10 minutes, then remove clamp and drain the bladder. Repeat 3 times a week- when not using the acetic acid irrigation     COSOPT PF 2-0.5 % SOLN Place 1 drop into both eyes 2 (two) times daily.     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     Emollient (EUCERIN EX) Apply 1 application  topically See admin instructions. Apply to affected area 2 times a day     FLOVENT HFA 110 MCG/ACT inhaler 2 each See admin instructions. 2 sprays to colostomy stoma prior to each colostomy bag change     iron polysaccharides (NU-IRON) 150 MG capsule Take 1 capsule (150 mg total) by mouth daily. 30 capsule 0   latanoprost (XALATAN) 0.005 % ophthalmic solution Place 1 drop into both eyes at bedtime.     Multiple Vitamin (MULTIVITAMIN WITH MINERALS) TABS tablet Take 1 tablet by mouth daily. 30 tablet 0   NON FORMULARY Apply 1 application  topically See admin instructions. Colo Plast paste- Apply as directed with each ostomy bag change     ondansetron (ZOFRAN) 4 MG tablet Take 1 tablet (4 mg) by mouth every 6 hours as needed for nausea. 20 tablet 0   rivaroxaban (XARELTO) 10 MG TABS tablet Take 1 tablet (10 mg total) by mouth daily with supper. (Patient taking differently: Take 10 mg by mouth daily.) 30 tablet 9   Skin Protectants, Misc. (MINERIN CREME) CREA Apply to affected areas 2 times daily as needed. 454 g 0   Zinc Oxide (BALMEX EX) Apply 1 application  topically See admin instructions. Apply topically daily to affected sites     No current facility-administered  medications for this visit.   Facility-Administered Medications Ordered in Other Visits  Medication Dose Route Frequency Provider Last Rate Last Admin   sodium chloride flush (NS) 0.9 % injection 10 mL  10 mL Intravenous PRN Artis Delay, MD        HISTORY OF PRESENT ILLNESS:   Oncology History  Multiple myeloma in relapse (HCC)  10/14/2010 Initial Diagnosis   Multiple myeloma in relapse (HCC)   01/09/2020 - 12/30/2021 Chemotherapy   Patient is on Treatment Plan : MYELOMA RELAPSED / REFRACTORY Daratumumab + Bortezomib + Dexamethasone (DaraVd) q21d / Daratumumab q28d     01/06/2022 - 03/24/2022 Chemotherapy   Patient is on Treatment Plan : MYELOMA RELAPSED/REFRACTORY Carfilzomib + Dexamethasone (Kd) weekly q28d     01/06/2022 -  Chemotherapy   Patient is on Treatment Plan : MYELOMA RELAPSED/REFRACTORY Carfilzomib (20/70) D1,8,15 + Dexamethasone weekly (40) (Kd) q28d  x 9 cycles / Dexamethasone D1,8,15         REVIEW OF SYSTEMS:   Constitutional: Denies  fevers, chills or abnormal weight loss Eyes: Denies blurriness of vision Ears, nose, mouth, throat, and face: Denies mucositis or sore throat Respiratory: Denies cough, dyspnea or wheezes Cardiovascular: Denies palpitation, chest discomfort or lower extremity swelling Gastrointestinal:  Denies nausea, heartburn or change in bowel habits Skin: Denies abnormal skin rashes Lymphatics: Denies new lymphadenopathy or easy bruising Neurological:Denies numbness, tingling or new weaknesses Behavioral/Psych: Mood is stable, no new changes  All other systems were reviewed with the patient and are negative.   VITALS:   Today's Vitals   06/22/23 1335  BP: 119/70  Pulse: 80  Resp: 12  Temp: 98.2 F (36.8 C)  SpO2: 100%   There is no height or weight on file to calculate BMI.   Wt Readings from Last 3 Encounters:  06/15/23 246 lb (111.6 kg)  06/07/23 246 lb (111.6 kg)  05/18/23 249 lb (112.9 kg)    .lastwe There is no height or weight  on file to calculate BMI.  Performance status (ECOG): 1 - Symptomatic but completely ambulatory  PHYSICAL EXAM:   GENERAL:alert, no distress and comfortable SKIN: skin color, texture, turgor are normal, no rashes or significant lesions EYES: normal, Conjunctiva are pink and non-injected, sclera clear OROPHARYNX:no exudate, no erythema and lips, buccal mucosa, and tongue normal  NECK: supple, thyroid normal size, non-tender, without nodularity LYMPH:  no palpable lymphadenopathy in the cervical, axillary or inguinal LUNGS: clear to auscultation and percussion with normal breathing effort HEART: regular rate & rhythm and no murmurs and no lower extremity edema ABDOMEN:abdomen soft, non-tender and normal bowel sounds.  Intact and patent colostomy. Musculoskeletal:no cyanosis of digits and no clubbing.  Patient is currently in a wheelchair NEURO: alert & oriented x 3 with fluent speech, no focal motor/sensory deficits  LABORATORY DATA:  I have reviewed the data as listed    Component Value Date/Time   NA 143 06/22/2023 0927   NA 141 03/01/2017 1133   K 4.4 06/22/2023 0927   K 4.0 03/01/2017 1133   CL 109 06/22/2023 0927   CL 104 11/15/2012 0823   CO2 26 06/22/2023 0927   CO2 26 03/01/2017 1133   GLUCOSE 111 (H) 06/22/2023 0927   GLUCOSE 89 03/01/2017 1133   GLUCOSE 141 (H) 11/15/2012 0823   BUN 19 06/22/2023 0927   BUN 13.7 03/01/2017 1133   CREATININE 0.82 06/22/2023 0927   CREATININE 0.8 03/01/2017 1133   CALCIUM 9.6 06/22/2023 0927   CALCIUM 10.1 03/01/2017 1133   PROT 6.2 (L) 06/22/2023 0927   PROT 7.7 03/01/2017 1133   PROT 8.1 03/01/2017 1133   ALBUMIN 3.6 06/22/2023 0927   ALBUMIN 3.7 03/01/2017 1133   AST 23 06/22/2023 0927   AST 28 03/01/2017 1133   ALT 25 06/22/2023 0927   ALT 35 03/01/2017 1133   ALKPHOS 90 06/22/2023 0927   ALKPHOS 91 03/01/2017 1133   BILITOT 0.6 06/22/2023 0927   BILITOT 0.60 03/01/2017 1133   GFRNONAA >60 06/22/2023 0927   GFRAA >60  04/23/2020 1006    Lab Results  Component Value Date   WBC 7.9 06/22/2023   NEUTROABS 5.0 06/22/2023   HGB 12.4 (L) 06/22/2023   HCT 36.2 (L) 06/22/2023   MCV 81.5 06/22/2023   PLT 142 (L) 06/22/2023

## 2023-06-21 NOTE — Assessment & Plan Note (Addendum)
IgG Lambda Multiple Myeloma, Relapsed (ISS Stage II) --findings are most consistent with relapsed multiple myeloma. Patient previously successfully treated with Velcade/Rev/Dex and Daratumumab/Velcade/Dex. On 07/02/2020 he transitioned to monthly daratumumab alone.  --due to to rise in M protein, switched to Kyprolis and Dexamethasone on 01/06/2022 Plan: --Due for Cycle 18, Day 15 of Kyprolis/Dex today  --Labs show white blood cell count 7.9, Hgb 12.4, MCV 82.4, Plt 142. Creatinine and LFTs in range.  --Most recent myeloma labs from 05/11/2023 showed M protein of 0.3 with normal serum free light chains.  ---Myeloma panel and serum free light chains from today, 06/22/2023, are pending. --Proceed with treatment today without any dose modifications. --Continue with weekly labs plus Kyprolis/Dex treatment as scheduled

## 2023-06-22 ENCOUNTER — Ambulatory Visit: Payer: Medicare (Managed Care) | Admitting: Nurse Practitioner

## 2023-06-22 ENCOUNTER — Encounter: Payer: Self-pay | Admitting: Nurse Practitioner

## 2023-06-22 ENCOUNTER — Other Ambulatory Visit: Payer: Medicare (Managed Care)

## 2023-06-22 ENCOUNTER — Ambulatory Visit: Payer: Medicare (Managed Care)

## 2023-06-22 ENCOUNTER — Inpatient Hospital Stay: Payer: Medicare (Managed Care)

## 2023-06-22 ENCOUNTER — Encounter: Payer: Self-pay | Admitting: Hematology and Oncology

## 2023-06-22 ENCOUNTER — Inpatient Hospital Stay (HOSPITAL_BASED_OUTPATIENT_CLINIC_OR_DEPARTMENT_OTHER): Payer: Medicare (Managed Care) | Admitting: Nurse Practitioner

## 2023-06-22 VITALS — BP 119/70 | HR 80 | Temp 98.2°F | Resp 12

## 2023-06-22 DIAGNOSIS — Z5111 Encounter for antineoplastic chemotherapy: Secondary | ICD-10-CM | POA: Diagnosis not present

## 2023-06-22 DIAGNOSIS — C9002 Multiple myeloma in relapse: Secondary | ICD-10-CM

## 2023-06-22 LAB — CBC WITH DIFFERENTIAL (CANCER CENTER ONLY)
Abs Immature Granulocytes: 0.05 10*3/uL (ref 0.00–0.07)
Basophils Absolute: 0 10*3/uL (ref 0.0–0.1)
Basophils Relative: 0 %
Eosinophils Absolute: 0.3 10*3/uL (ref 0.0–0.5)
Eosinophils Relative: 4 %
HCT: 36.2 % — ABNORMAL LOW (ref 39.0–52.0)
Hemoglobin: 12.4 g/dL — ABNORMAL LOW (ref 13.0–17.0)
Immature Granulocytes: 1 %
Lymphocytes Relative: 12 %
Lymphs Abs: 0.9 10*3/uL (ref 0.7–4.0)
MCH: 27.9 pg (ref 26.0–34.0)
MCHC: 34.3 g/dL (ref 30.0–36.0)
MCV: 81.5 fL (ref 80.0–100.0)
Monocytes Absolute: 1.5 10*3/uL — ABNORMAL HIGH (ref 0.1–1.0)
Monocytes Relative: 19 %
Neutro Abs: 5 10*3/uL (ref 1.7–7.7)
Neutrophils Relative %: 64 %
Platelet Count: 142 10*3/uL — ABNORMAL LOW (ref 150–400)
RBC: 4.44 MIL/uL (ref 4.22–5.81)
RDW: 17.7 % — ABNORMAL HIGH (ref 11.5–15.5)
WBC Count: 7.9 10*3/uL (ref 4.0–10.5)
nRBC: 0.3 % — ABNORMAL HIGH (ref 0.0–0.2)

## 2023-06-22 LAB — CMP (CANCER CENTER ONLY)
ALT: 25 U/L (ref 0–44)
AST: 23 U/L (ref 15–41)
Albumin: 3.6 g/dL (ref 3.5–5.0)
Alkaline Phosphatase: 90 U/L (ref 38–126)
Anion gap: 8 (ref 5–15)
BUN: 19 mg/dL (ref 8–23)
CO2: 26 mmol/L (ref 22–32)
Calcium: 9.6 mg/dL (ref 8.9–10.3)
Chloride: 109 mmol/L (ref 98–111)
Creatinine: 0.82 mg/dL (ref 0.61–1.24)
GFR, Estimated: >60 mL/min (ref >60)
Glucose, Bld: 111 mg/dL — ABNORMAL HIGH (ref 70–99)
Potassium: 4.4 mmol/L (ref 3.5–5.1)
Sodium: 143 mmol/L (ref 135–145)
Total Bilirubin: 0.6 mg/dL (ref <1.2)
Total Protein: 6.2 g/dL — ABNORMAL LOW (ref 6.5–8.1)

## 2023-06-22 MED ORDER — SODIUM CHLORIDE 0.9 % IV SOLN: Freq: Once | INTRAVENOUS | Status: AC

## 2023-06-22 MED ORDER — CARFILZOMIB CHEMO INJECTION 60 MG
70.0000 mg/m2 | Freq: Once | INTRAVENOUS | Status: AC
  Administered 2023-06-22: 150 mg via INTRAVENOUS
  Filled 2023-06-22: qty 60

## 2023-06-22 MED ORDER — SODIUM CHLORIDE 0.9 % IV SOLN
40.0000 mg | Freq: Once | INTRAVENOUS | Status: AC
  Administered 2023-06-22: 40 mg via INTRAVENOUS
  Filled 2023-06-22: qty 4

## 2023-06-22 NOTE — Patient Instructions (Signed)
 [facility] - A DEPT OF [facility]  Discharge Instructions: Thank you for choosing [facility] to provide your oncology and hematology care.   If you have a lab appointment with the Cancer Center, please go directly to the Cancer Center and check in at the registration area.   Wear comfortable clothing and clothing appropriate for easy access to any Portacath or PICC line.   We strive to give you quality time with your provider. You may need to reschedule your appointment if you arrive late (15 or more minutes).  Arriving late affects you and other patients whose appointments are after yours.  Also, if you miss three or more appointments without notifying the office, you may be dismissed from the clinic at the provider's discretion.      For prescription refill requests, have your pharmacy contact our office and allow 72 hours for refills to be completed.    Today you received the following chemotherapy and/or immunotherapy agents: Kyprolis      To help prevent nausea and vomiting after your treatment, we encourage you to take your nausea medication as directed.  BELOW ARE SYMPTOMS THAT SHOULD BE REPORTED IMMEDIATELY: *FEVER GREATER THAN 100.4 F (38 C) OR HIGHER *CHILLS OR SWEATING *NAUSEA AND VOMITING THAT IS NOT CONTROLLED WITH YOUR NAUSEA MEDICATION *UNUSUAL SHORTNESS OF BREATH *UNUSUAL BRUISING OR BLEEDING *URINARY PROBLEMS (pain or burning when urinating, or frequent urination) *BOWEL PROBLEMS (unusual diarrhea, constipation, pain near the anus) TENDERNESS IN MOUTH AND THROAT WITH OR WITHOUT PRESENCE OF ULCERS (sore throat, sores in mouth, or a toothache) UNUSUAL RASH, SWELLING OR PAIN  UNUSUAL VAGINAL DISCHARGE OR ITCHING   Items with * indicate a potential emergency and should be followed up as soon as possible or go to the Emergency Department if any problems should occur.  Please show the CHEMOTHERAPY ALERT CARD or IMMUNOTHERAPY  ALERT CARD at check-in to the Emergency Department and triage nurse.  Should you have questions after your visit or need to cancel or reschedule your appointment, please contact [facility] - A DEPT OF [facility]  Dept: [phone]  and follow the prompts.  Office hours are 8:00 a.m. to 4:30 p.m. Monday - Friday. Please note that voicemails left after 4:00 p.m. may not be returned until the following business day.  We are closed weekends and major holidays. You have access to a nurse at all times for urgent questions. Please call the main number to the clinic Dept: [phone] and follow the prompts.   For any non-urgent questions, you may also contact your provider using MyChart. We now offer e-Visits for anyone 38 and older to request care online for non-urgent symptoms. For details visit [email or web address].   Also download the MyChart app! Go to the app store, search "MyChart", open the app, select [facility] [place], and log in with your MyChart username and password.

## 2023-06-25 ENCOUNTER — Encounter: Payer: Self-pay | Admitting: Hematology and Oncology

## 2023-07-06 ENCOUNTER — Inpatient Hospital Stay: Payer: Medicare (Managed Care) | Attending: Physician Assistant

## 2023-07-06 ENCOUNTER — Inpatient Hospital Stay: Payer: Medicare (Managed Care)

## 2023-07-06 ENCOUNTER — Inpatient Hospital Stay (HOSPITAL_BASED_OUTPATIENT_CLINIC_OR_DEPARTMENT_OTHER): Payer: Medicare (Managed Care) | Admitting: Hematology and Oncology

## 2023-07-06 VITALS — BP 135/67 | HR 68 | Resp 18

## 2023-07-06 VITALS — BP 106/76 | HR 93 | Temp 98.7°F | Resp 18 | Wt 243.8 lb

## 2023-07-06 DIAGNOSIS — Z5111 Encounter for antineoplastic chemotherapy: Secondary | ICD-10-CM

## 2023-07-06 DIAGNOSIS — R413 Other amnesia: Secondary | ICD-10-CM | POA: Insufficient documentation

## 2023-07-06 DIAGNOSIS — Z8619 Personal history of other infectious and parasitic diseases: Secondary | ICD-10-CM | POA: Insufficient documentation

## 2023-07-06 DIAGNOSIS — Z7901 Long term (current) use of anticoagulants: Secondary | ICD-10-CM | POA: Diagnosis not present

## 2023-07-06 DIAGNOSIS — C9002 Multiple myeloma in relapse: Secondary | ICD-10-CM

## 2023-07-06 DIAGNOSIS — R0683 Snoring: Secondary | ICD-10-CM | POA: Diagnosis not present

## 2023-07-06 DIAGNOSIS — Z86718 Personal history of other venous thrombosis and embolism: Secondary | ICD-10-CM | POA: Insufficient documentation

## 2023-07-06 DIAGNOSIS — Z95828 Presence of other vascular implants and grafts: Secondary | ICD-10-CM

## 2023-07-06 LAB — CBC WITH DIFFERENTIAL (CANCER CENTER ONLY)
Abs Immature Granulocytes: 0.04 10*3/uL (ref 0.00–0.07)
Basophils Absolute: 0.1 10*3/uL (ref 0.0–0.1)
Basophils Relative: 1 %
Eosinophils Absolute: 0.4 10*3/uL (ref 0.0–0.5)
Eosinophils Relative: 5 %
HCT: 33.9 % — ABNORMAL LOW (ref 39.0–52.0)
Hemoglobin: 11.3 g/dL — ABNORMAL LOW (ref 13.0–17.0)
Immature Granulocytes: 1 %
Lymphocytes Relative: 13 %
Lymphs Abs: 1 10*3/uL (ref 0.7–4.0)
MCH: 27.4 pg (ref 26.0–34.0)
MCHC: 33.3 g/dL (ref 30.0–36.0)
MCV: 82.3 fL (ref 80.0–100.0)
Monocytes Absolute: 1 10*3/uL (ref 0.1–1.0)
Monocytes Relative: 13 %
Neutro Abs: 5.4 10*3/uL (ref 1.7–7.7)
Neutrophils Relative %: 67 %
Platelet Count: 394 10*3/uL (ref 150–400)
RBC: 4.12 MIL/uL — ABNORMAL LOW (ref 4.22–5.81)
RDW: 17.3 % — ABNORMAL HIGH (ref 11.5–15.5)
WBC Count: 7.9 10*3/uL (ref 4.0–10.5)
nRBC: 0 % (ref 0.0–0.2)

## 2023-07-06 LAB — CMP (CANCER CENTER ONLY)
ALT: 18 U/L (ref 0–44)
AST: 16 U/L (ref 15–41)
Albumin: 3.8 g/dL (ref 3.5–5.0)
Alkaline Phosphatase: 87 U/L (ref 38–126)
Anion gap: 8 (ref 5–15)
BUN: 11 mg/dL (ref 8–23)
CO2: 26 mmol/L (ref 22–32)
Calcium: 9.6 mg/dL (ref 8.9–10.3)
Chloride: 109 mmol/L (ref 98–111)
Creatinine: 0.72 mg/dL (ref 0.61–1.24)
GFR, Estimated: >60 mL/min (ref >60)
Glucose, Bld: 147 mg/dL — ABNORMAL HIGH (ref 70–99)
Potassium: 3.8 mmol/L (ref 3.5–5.1)
Sodium: 143 mmol/L (ref 135–145)
Total Bilirubin: 0.8 mg/dL (ref <1.2)
Total Protein: 6.7 g/dL (ref 6.5–8.1)

## 2023-07-06 MED ORDER — CARFILZOMIB CHEMO INJECTION 60 MG
70.0000 mg/m2 | Freq: Once | INTRAVENOUS | Status: AC
  Administered 2023-07-06: 150 mg via INTRAVENOUS
  Filled 2023-07-06: qty 60

## 2023-07-06 MED ORDER — SODIUM CHLORIDE 0.9 % IV SOLN: Freq: Once | INTRAVENOUS | Status: AC

## 2023-07-06 MED ORDER — SODIUM CHLORIDE 0.9 % IV SOLN
Freq: Once | INTRAVENOUS | Status: AC
Start: 2023-07-06 — End: 2023-07-06

## 2023-07-06 MED ORDER — SODIUM CHLORIDE 0.9 % IV SOLN
40.0000 mg | Freq: Once | INTRAVENOUS | Status: AC
  Administered 2023-07-06: 40 mg via INTRAVENOUS
  Filled 2023-07-06: qty 4

## 2023-07-06 NOTE — Progress Notes (Signed)
[facility] [phone]   [phone]  PROGRESS NOTE  Patient Care Team: Jethro Bastos, MD as PCP - General (Family Medicine) Ranelle Oyster, MD as Consulting Physician (Physical Medicine and Rehabilitation) Hildred Alamin Mcarthur Rossetti, PA-C as Physician Assistant (Physical Medicine and Rehabilitation) Jaci Standard, MD as Consulting Physician (Hematology and Oncology) Jaci Standard, MD as Consulting Physician (Hematology and Oncology) Jaci Standard, MD as Consulting Physician (Hematology and Oncology)  Hematological/Oncological History # IgG Lambda Multiple Myeloma, Relapsed (ISS Stage II) 1) 06/2010: initial diagnosis of Multiple Myeloma after T8 compression fracture. Treated with Velcade/Revlimid/Dexamethasone and achieved a complete remission 2) Velcade was discontinued in September 2012 and that Revlimid and Decadron were discontinued in March 2013. 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 4) Followed by Dr. Bertis Ruddy, last clinic visit 10/09/2019. At that time there was concern for relapse of his multiple myeloma.  5) Patient requested transfer to different provider after misunderstanding regarding imaging studies 6) 12/17/2019: transfer care to Dr. Leonides Schanz  7) 01/09/2020: Cycle 1 Day 1 of Dara/Velcade/Dex 8) 01/21/2020: presented as urgent visit for diarrhea and dehydration. Holding chemotherapy scheduled for 01/23/2020. 9) 01/30/2020: Resume dara/velcade/dex after resolution of diarrhea.  10) 02/13/2020: restaging labs show M protein 0.8, Kappa 4.5, lamba 17.2, ratio 0.26, urine M protein 53 (7.1%). All MM labs indicate improvement.  11) 03/10/2020: Cycle 4 Day 1 of Dara/Velcade/Dex. Transition to q 3 week daratumumab.  12) 06/04/2020:  Cycle 8 Day 1 of Dara/Velcade/Dex 13) 06/25/2020:  Cycle 9 Day 1 of Dara/Velcade/Dex 14) 07/22/2020: Cycle 10 Day 1  of  Dara//Dex 15) 08/18/2020: Cycle 11 Day 1 of Dara//Dex 16) 09/23/2020: Cycle 12 Day 1 of Dara//Dex 17) 10/22/2020: Cycle 13 Day 1 of Dara/Dex  18) 11/19/2020: Cycle 14 Day 1 of Dara/Dex  19) 12/17/2020: Cycle 15 Day 1 of Dara/Dex  20) 01/17/2021: Cycle 16 Day 1 of Dara/Dex  21) 02/11/2021: Cycle 17 Day 1 of Dara/Dex 22) 03/10/2021: Cycle 18 Day 1 of Dara/Dex  23) 04/08/2021: Cycle 19 Day 1 of Dara/Dex  24) 08/03/2021: Cycle 20 Day 1 of Dara/Dex (delayed due to scheduling error) 25) 09/02/2021: Cycle 21 Day 1 of Dara/Dex 26) 09/30/2021: Cycle 22 Day 1 of Dara/Dex 27) 10/28/2021: Cycle 23 Day 1 of Dara/Dex 28) 12/02/2021: Cycle 24 Day 1 of Dara/Dex 29) 12/30/2021: Cycle 25 Day 1 of Dara/Dex 30) 01/06/2022: Cycle 1 Day 1 of Kyprolis/Dex 31) 02/03/2022: Cycle 2 Day 1 of Kyprolis/Dex 32) 02/24/2022: Cycle 3 Day 1 of Kyprolis/Dex 33) 04/07/2022: Cycle 4 Day 1 of Kyprolis/Dex 34) 05/05/2022: Cycle 5 Day 1 of Kyprolis/Dex 35) 06/01/2022: Cycle 6 Day 1 of Kyprolis/Dex 36) 06/29/2022: Cycle 7 Day 1 of Kyprolis/Dex 37) 07/28/2022: Cycle 8 Day 1 of Kyprolis/Dex 38) 08/25/2022: Cycle 9 Day 1 of Kyprolis/Dex 39) 09/22/2022: Cycle 10 Day 1 of Kyprolis/Dex 40) 10/20/2022: Cycle 11 Day 1 of Kyprolis/Dex 41) 11/17/2022: Cycle 12 Day 1 of Kyprolis/Dex 42 ) 12/22/2022-01/08/2023: Admitted for fournier's gangrene of scrotum.  43) 01/26/2023: Cycle 13 Day 1 of Kyprolis/Dex 44) 02/16/2023: Cycle 14 Day 1 of Kyprolis/Dex 45) 03/16/2023:Cycle 15 Day 1 of Kyprolis/Dex 46) 04/13/2023: Cycle 16 Day 1 of Kyprolis/Dex 47) 05/11/2023: Cycle 17 Day 1 of Kyprolis/Dex 48) 06/07/2023: Cycle 18 Day 1 of Kyprolis/Dex 49) 07/06/2023: Cycle 19 Day 1 of Kyprolis/Dex  Interval History:  [name] 67 y.o. male with medical history significant  for  IgG Lambda Multiple Myeloma who presents for a follow up visit. The patient's last visit was on 06/07/2023.  He presents today prior to treatment with Kyprolis/Dex. He is unaccompanied for this visit.    On exam today [name] reports he has been "all right" in the interim since her last visit.  He reports that his energy levels have "not been great".  He is also somewhat frustrated by his new wheelchair which is somewhat wider and more difficult to steer than his prior 1.  He reports that he is also not enjoying wearing his CPAP mask for his OSA.  Otherwise he does appear to be tolerating his Kyprolis therapy well.  He notes he is not having any side effects as a result of this treatment.  He denies any nausea, vomiting, or diarrhea.  He is not having any numbness or tingling of his fingers or toes.  Overall he is willing and able to proceed with Kyprolis therapy at this time.  He denies fevers, chills, night sweats, shortness of breath, chest pain or cough.  He has no other complaints.  Rest of the 10 point ROS is below.  MEDICAL HISTORY:  Past Medical History:  Diagnosis Date   Adrenal insufficiency (HCC)    on chronic dexamethasone   Anemia    Cancer (HCC)    Coagulopathy (HCC)    on xeralto/ s/p DVT while on coumadin,  IVC in place   Diabetes mellitus without complication (HCC)    type 2   Glaucoma 12/22/2022   Gross hematuria 01/2013   post foley cath procedure   History of blood transfusion 01/2013   Lower GI bleeding 04/18/2017   Multiple myeloma    thoracic T8 with paraplegia s/p resection- on chemo at visit 10/13/10   Multiple myeloma    Multiple myeloma without mention of remission    Neurogenic bladder    Neurogenic bowel    OSA (obstructive sleep apnea) 11/01/2022   Paraplegia (HCC)    Partial small bowel obstruction (HCC) during dec 2011 admission    SURGICAL HISTORY: Past Surgical History:  Procedure Laterality Date   COLONOSCOPY WITH PROPOFOL N/A 04/12/2017   Procedure: COLONOSCOPY WITH PROPOFOL;  Surgeon: Hilarie Fredrickson, MD;  Location: [facility];  Service: Endoscopy;  Laterality: N/A;   COLONOSCOPY WITH PROPOFOL N/A 04/19/2017   Procedure: COLONOSCOPY WITH  PROPOFOL;  Surgeon: Benancio Deeds, MD;  Location: [facility];  Service: Gastroenterology;  Laterality: N/A;   COLOSTOMY  07/20/2011   Procedure: COLOSTOMY;  Surgeon: Rulon Abide, DO;  [facility] OR;  Service: General;;   COLOSTOMY REVISION  07/20/2011   Procedure: COLON RESECTION SIGMOID;  Surgeon: Rulon Abide, DO;  Location: [facility] OR;  Service: General;;   CYSTOSCOPY N/A 04/04/2013   Procedure: CYSTOSCOPY WITH LITHALOPAXY;  Surgeon: Sebastian Ache, MD;  Location: [facility];  Service: Urology;  Laterality: N/A;   INCISION AND DRAINAGE ABSCESS N/A 12/25/2022   Procedure: INCISION AND DRAINAGE OF SCROTUM;  Surgeon: Despina Arias, MD;  Location: [facility];  Service: Urology;  Laterality: N/A;   INSERTION OF SUPRAPUBIC CATHETER N/A 04/04/2013   Procedure: INSERTION OF SUPRAPUBIC CATHETER;  Surgeon: Sebastian Ache, MD;  Location: [facility];  Service: Urology;  Laterality: N/A;   LAPAROTOMY  07/20/2011   Procedure: EXPLORATORY LAPAROTOMY;  Surgeon: Rulon Abide, DO;  Location: [facility] OR;  Service: General;  Laterality: N/A;   myeloma thoracic T8 with parpaplegia s/p thoracotomy and thoracic T7-9 cage placement on  Dec 26th 2011  07/18/10   SCROTAL EXPLORATION N/A 12/29/2022   Procedure: SCROTAL WOUND DEBRIDEMENT AND CLOSURE;  Surgeon: Loletta Parish., MD;  Location: [facility];  Service: Urology;  Laterality: N/A;    SOCIAL HISTORY: Social History   Socioeconomic History   Marital status: Married    Spouse name: Not on file   Number of children: Not on file   Years of education: Not on file   Highest education level: Not on file  Occupational History   Not on file  Tobacco Use   Smoking status: Never   Smokeless tobacco: Never  Vaping Use   Vaping status: Never Used  Substance and Sexual Activity   Alcohol use: No   Drug use: No   Sexual activity: Never  Other Topics Concern   Not on file  Social History Narrative   Not on file   Social Drivers of Health    Financial Resource Strain: Not on file  Food Insecurity: No Food Insecurity (12/22/2022)   Hunger Vital Sign    Worried About Running Out of Food in the Last Year: Never true    Ran Out of Food in the Last Year: Never true  Transportation Needs: No Transportation Needs (12/22/2022)   PRAPARE - Administrator, Civil Service (Medical): No    Lack of Transportation (Non-Medical): No  Physical Activity: Not on file  Stress: Not on file  Social Connections: Not on file  Intimate Partner Violence: Not At Risk (12/22/2022)   Humiliation, Afraid, Rape, and Kick questionnaire    Fear of Current or Ex-Partner: No    Emotionally Abused: No    Physically Abused: No    Sexually Abused: No    FAMILY HISTORY: Family History  Problem Relation Age of Onset   Ovarian cancer Mother    Diabetes Father     ALLERGIES:  is allergic to ferumoxytol.  MEDICATIONS:  Current Outpatient Medications  Medication Sig Dispense Refill   acetaminophen (TYLENOL) 325 MG tablet Take 325-650 mg by mouth every 6 (six) hours as needed for headache or mild pain.     acetic acid 0.25 % irrigation Irrigate with 1 Application as directed See admin instructions. Instill 50 ml's, clamp for 10 minutes, then remove clamp and drain the bladder. Repeat 3 times a week- when not using the Renacidin irrigation     acyclovir (ZOVIRAX) 400 MG tablet Take 1 tablet (400 mg total) by mouth 2 (two) times daily. 60 tablet 2   albuterol (VENTOLIN HFA) 108 (90 Base) MCG/ACT inhaler Inhale 2 puffs into the lungs every 4 (four) hours as needed for wheezing or shortness of breath.     atorvastatin (LIPITOR) 20 MG tablet Take 20 mg by mouth daily.     BENADRYL ALLERGY 25 MG tablet Take 25 mg by mouth every 6 (six) hours as needed (for allergic reactions).     BIOFREEZE 4 % GEL Apply 1 application  topically every 4 (four) hours as needed (for joint pain- to intact areas of the skin only).     Citric Ac-Gluconolact-Mg Carb  (RENACIDIN IR) Irrigate with 30 mLs as directed See admin instructions. Instill 30 ml's, clamp for 10 minutes, then remove clamp and drain the bladder. Repeat 3 times a week- when not using the acetic acid irrigation     COSOPT PF 2-0.5 % SOLN Place 1 drop into both eyes 2 (two) times daily.     diazepam (VALIUM) 5 MG tablet Take 5 mg  by mouth 2 (two) times daily as needed for muscle spasms (after spinal cord injury).     Emollient (EUCERIN EX) Apply 1 application  topically See admin instructions. Apply to affected area 2 times a day     FLOVENT HFA 110 MCG/ACT inhaler 2 each See admin instructions. 2 sprays to colostomy stoma prior to each colostomy bag change     iron polysaccharides (NU-IRON) 150 MG capsule Take 1 capsule (150 mg total) by mouth daily. 30 capsule 0   latanoprost (XALATAN) 0.005 % ophthalmic solution Place 1 drop into both eyes at bedtime.     Multiple Vitamin (MULTIVITAMIN WITH MINERALS) TABS tablet Take 1 tablet by mouth daily. 30 tablet 0   NON FORMULARY Apply 1 application  topically See admin instructions. Colo Plast paste- Apply as directed with each ostomy bag change     ondansetron (ZOFRAN) 4 MG tablet Take 1 tablet (4 mg) by mouth every 6 hours as needed for nausea. 20 tablet 0   rivaroxaban (XARELTO) 10 MG TABS tablet Take 1 tablet (10 mg total) by mouth daily with supper. (Patient taking differently: Take 10 mg by mouth daily.) 30 tablet 9   Skin Protectants, Misc. (MINERIN CREME) CREA Apply to affected areas 2 times daily as needed. 454 g 0   Zinc Oxide (BALMEX EX) Apply 1 application  topically See admin instructions. Apply topically daily to affected sites     No current facility-administered medications for this visit.   Facility-Administered Medications Ordered in Other Visits  Medication Dose Route Frequency Provider Last Rate Last Admin   sodium chloride flush (NS) 0.9 % injection 10 mL  10 mL Intravenous PRN Bertis Ruddy, Ni, MD        REVIEW OF SYSTEMS:    Constitutional: ( - ) fevers, ( - )  chills , ( - ) night sweats Eyes: ( - ) blurriness of vision, ( - ) double vision, ( - ) watery eyes Ears, nose, mouth, throat, and face: ( - ) mucositis, ( - ) sore throat Respiratory: ( - ) cough, ( - ) dyspnea, ( - ) wheezes Cardiovascular: ( - ) palpitation, ( - ) chest discomfort, ( - ) lower extremity swelling Gastrointestinal:  ( - ) nausea, ( - ) heartburn, ( - ) change in bowel habits Skin: ( - ) abnormal skin rashes Lymphatics: ( - ) new lymphadenopathy, ( - ) easy bruising Neurological: ( - ) numbness, ( - ) tingling, ( - ) new weaknesses Behavioral/Psych: ( - ) mood change, ( - ) new changes  All other systems were reviewed with the patient and are negative.  PHYSICAL EXAMINATION: ECOG PERFORMANCE STATUS: paraplegic.   Vitals:   07/06/23 0954  BP: 106/76  Pulse: 93  Resp: 18  Temp: 98.7 F (37.1 C)  SpO2: 100%     Filed Weights   07/06/23 0954  Weight: 243 lb 12.8 oz (110.6 kg)    GENERAL: well appearing middle aged [place] American male alert, no distress and comfortable SKIN: skin color, texture, turgor are normal, no rashes or significant lesions EYES: conjunctiva are pink and non-injected, sclera clear LUNGS: clear to auscultation and percussion with normal breathing effort HEART: regular rate & rhythm and no murmurs Musculoskeletal: no cyanosis of digits and no clubbing  PSYCH: alert & oriented x 3, fluent speech NEURO: paraplegic, no use of LE bilaterally.   LABORATORY DATA:  I have reviewed the data as listed    Latest Ref Rng & Units 07/06/2023  9:31 AM 06/22/2023    9:27 AM 06/15/2023    9:52 AM  CBC  WBC 4.0 - 10.5 K/uL 7.9  7.9  7.1   Hemoglobin 13.0 - 17.0 g/dL 62.9  52.8  41.3   Hematocrit 39.0 - 52.0 % 33.9  36.2  34.1   Platelets 150 - 400 K/uL 394  142  200        Latest Ref Rng & Units 07/06/2023    9:31 AM 06/22/2023    9:27 AM 06/15/2023    9:52 AM  CMP  Glucose 70 - 99 mg/dL 244  010   272   BUN 8 - 23 mg/dL 11  19  11    Creatinine 0.61 - 1.24 mg/dL 5.36  6.44  0.34   Sodium 135 - 145 mmol/L 143  143  143   Potassium 3.5 - 5.1 mmol/L 3.8  4.4  4.1   Chloride 98 - 111 mmol/L 109  109  108   CO2 22 - 32 mmol/L 26  26  30    Calcium 8.9 - 10.3 mg/dL 9.6  9.6  9.6   Total Protein 6.5 - 8.1 g/dL 6.7  6.2  6.3   Total Bilirubin <1.2 mg/dL 0.8  0.6  0.5   Alkaline Phos 38 - 126 U/L 87  90  96   AST 15 - 41 U/L 16  23  12    ALT 0 - 44 U/L 18  25  15      Lab Results  Component Value Date   MPROTEIN 0.2 (H) 06/07/2023   MPROTEIN 0.3 (H) 05/11/2023   MPROTEIN 0.3 (H) 04/13/2023   Lab Results  Component Value Date   KPAFRELGTCHN 11.1 06/07/2023   KPAFRELGTCHN 14.2 05/11/2023   KPAFRELGTCHN 13.1 04/13/2023   LAMBDASER 6.6 06/07/2023   LAMBDASER 10.9 05/11/2023   LAMBDASER 10.3 04/13/2023   KAPLAMBRATIO 1.68 (H) 06/07/2023   KAPLAMBRATIO 1.30 05/11/2023   KAPLAMBRATIO 1.27 04/13/2023    RADIOGRAPHIC STUDIES: No results found.  ASSESSMENT & PLAN [name] 67 y.o. male with medical history significant for  IgG Lambda Multiple Myeloma who presents for a follow up visit.  # IgG Lambda Multiple Myeloma, Relapsed (ISS Stage II) --findings are most consistent with relapsed multiple myeloma. Patient previously successfully treated with Velcade/Rev/Dex and Daratumumab/Velcade/Dex. On 07/02/2020 he transitioned to monthly daratumumab alone.  --due to to rise in M protein, switched to Kyprolis and Dexamethasone on 01/06/2022 Plan: --Due for Cycle 19, Day 1 of Kyprolis/Dex today  --Labs show white blood cell count 7.9, hemoglobin 11.3, MCV 82.3, platelets 394. Creatinine and LFTs in range.  --Most recent myeloma labs from 05/11/2023 showed M protein of 0.2 with normal serum free light chains.  --Proceed with treatment today without any dose modifications. --Continue with weekly labs plus Kyprolis/Dex treatment as scheduled  #Memory loss: --Encouraged to do memory  exercises including puzzles, suduko, etc --If symptoms worsen, we can make a referral to neurology for further evaluation.   #History of Sepsis 2/2 UTI and Fournier's gangrene of scrotum: --Hospitalized from 12/22/2022-01/08/2023. Underwent debridement of necrotic tissue. --Given PIP tazobactam and Zyvox initially --Urine culture reveals Proteus mirabilis which is resistant to ciprofloxacin and nitrofurantoin --Blood cultures negative --D/C on remainder doxycycline and Augmentin which he completed on 01/07/2023.  --Evaluated by urology on 7/2 and ID on 7/3 with continued wound healing and no further d/c or evidence of infection.   #History of DVT --He had placement of IVC filter, remains on Xarelto. --Due to poor  mobility, and lack of bleeding complications, recommend to continue on Xarelto indefinitely. --caution if Plt count were to drop <50  #Snoring/Risk for OSA: --Diagnosed with OSA and has CPAP machine.   # Supportive Care -- provided patient with an albuterol inhaler (for use with daratumumab) --acyclovir 400mg  BID for VZV prophylaxis --zofran 8mg  q8H PRN for nausea/vomiting  --Zometa is being held in the setting of his prior episode of osteonecrosis of the jaw  Orders Placed This Encounter  Procedures   Multiple Myeloma Panel (SPEP&IFE w/QIG)    Standing Status:   Future    Expected Date:   08/03/2023    Expiration Date:   08/02/2024   Kappa/lambda light chains    Standing Status:   Future    Expected Date:   08/03/2023    Expiration Date:   08/02/2024   CBC with Differential (Cancer Center Only)    Standing Status:   Future    Expected Date:   08/03/2023    Expiration Date:   08/02/2024   CMP (Cancer Center only)    Standing Status:   Future    Expected Date:   08/03/2023    Expiration Date:   08/02/2024   CBC with Differential (Cancer Center Only)    Standing Status:   Future    Expected Date:   08/10/2023    Expiration Date:   08/09/2024   CMP (Cancer Center only)     Standing Status:   Future    Expected Date:   08/10/2023    Expiration Date:   08/09/2024   CBC with Differential (Cancer Center Only)    Standing Status:   Future    Expected Date:   08/17/2023    Expiration Date:   08/16/2024   CMP (Cancer Center only)    Standing Status:   Future    Expected Date:   08/17/2023    Expiration Date:   08/16/2024   Multiple Myeloma Panel (SPEP&IFE w/QIG)    Standing Status:   Future    Expected Date:   08/31/2023    Expiration Date:   08/30/2024   Kappa/lambda light chains    Standing Status:   Future    Expected Date:   08/31/2023    Expiration Date:   08/30/2024   CBC with Differential (Cancer Center Only)    Standing Status:   Future    Expected Date:   08/31/2023    Expiration Date:   08/30/2024   CMP (Cancer Center only)    Standing Status:   Future    Expected Date:   08/31/2023    Expiration Date:   08/30/2024   CBC with Differential (Cancer Center Only)    Standing Status:   Future    Expected Date:   09/07/2023    Expiration Date:   09/06/2024   CMP (Cancer Center only)    Standing Status:   Future    Expected Date:   09/07/2023    Expiration Date:   09/06/2024   CBC with Differential (Cancer Center Only)    Standing Status:   Future    Expected Date:   09/14/2023    Expiration Date:   09/13/2024   CMP (Cancer Center only)    Standing Status:   Future    Expected Date:   09/14/2023    Expiration Date:   09/13/2024   Multiple Myeloma Panel (SPEP&IFE w/QIG)    Standing Status:   Future    Expected Date:   09/28/2023  Expiration Date:   09/27/2024   Kappa/lambda light chains    Standing Status:   Future    Expected Date:   09/28/2023    Expiration Date:   09/27/2024   CBC with Differential (Cancer Center Only)    Standing Status:   Future    Expected Date:   09/28/2023    Expiration Date:   09/27/2024   CMP (Cancer Center only)    Standing Status:   Future    Expected Date:   09/28/2023    Expiration Date:   09/27/2024   CBC with Differential (Cancer Center Only)     Standing Status:   Future    Expected Date:   10/05/2023    Expiration Date:   10/04/2024   CMP (Cancer Center only)    Standing Status:   Future    Expected Date:   10/05/2023    Expiration Date:   10/04/2024   CBC with Differential (Cancer Center Only)    Standing Status:   Future    Expected Date:   10/12/2023    Expiration Date:   10/11/2024   CMP (Cancer Center only)    Standing Status:   Future    Expected Date:   10/12/2023    Expiration Date:   10/11/2024    All questions were answered. The patient knows to call the clinic with any problems, questions or concerns.  I have spent a total of 30 minutes minutes of face-to-face and non-face-to-face time, preparing to see the patient,  performing a medically appropriate examination, counseling and educating the patient, communicating with other health care professionals, documenting clinical information in the electronic health record,  and care coordination.    Ulysees Barns, MD Department of Hematology/Oncology [facility] Phone: [phone] Pager: [phone] Email: [email or web address]   07/06/2023 3:39 PM   Literature Support:  Milagros Loll, Petrucci MT, [place], [place], [place], [place], Spada S, [place], Ponticelli E, [place], Cavo M, Di Toritto TC, Haydee Salter F, [place] V, Palumbo A, Boccadoro M, Larocca A. Once-weekly versus twice-weekly carfilzomib in patients with newly diagnosed multiple myeloma: a pooled analysis of two phase I/II studies. Haematologica. 2019 Aug;104(8):1640-1647.  --Once-weekly 70 mg/m2 carfilzomib as induction and maintenance therapy for newly diagnosed multiple myeloma patients was as safe and effective as twice-weekly 36 mg/m2 carfilzomib and provided a more convenient schedule.

## 2023-07-06 NOTE — Patient Instructions (Addendum)
 [facility] DE[facility] OF [facility]  Discharge Instructions: Thank you for choosing [facility] to provide your oncology and hematology care.   If you have a lab appointment with the Cancer Center, please go directly to the Cancer Center and check in at the registration area.   Wear comfortable clothing and clothing appropriate for easy access to any Portacath or PICC line.   We strive to give you quality time with your provider. You may need to reschedule your appointment if you arrive late (15 or more minutes).  Arriving late affects you and other patients whose appointments are after yours.  Also, if you miss three or more appointments without notifying the office, you may be dismissed from the clinic at the provider's discretion.      For prescription refill requests, have your pharmacy contact our office and allow 72 hours for refills to be completed.    Today you received the following chemotherapy and/or immunotherapy agents: Kyprolis      To help prevent nausea and vomiting after your treatment, we encourage you to take your nausea medication as directed.  BELOW ARE SYMPTOMS THAT SHOULD BE REPORTED IMMEDIATELY: *FEVER GREATER THAN 100.4 F (38 C) OR HIGHER *CHILLS OR SWEATING *NAUSEA AND VOMITING THAT IS NOT CONTROLLED WITH YOUR NAUSEA MEDICATION *UNUSUAL SHORTNESS OF BREATH *UNUSUAL BRUISING OR BLEEDING *URINARY PROBLEMS (pain or burning when urinating, or frequent urination) *BOWEL PROBLEMS (unusual diarrhea, constipation, pain near the anus) TENDERNESS IN MOUTH AND THROAT WITH OR WITHOUT PRESENCE OF ULCERS (sore throat, sores in mouth, or a toothache) UNUSUAL RASH, SWELLING OR PAIN  UNUSUAL VAGINAL DISCHARGE OR ITCHING   Items with * indicate a potential emergency and should be followed up as soon as possible or go to the Emergency Department if any problems should occur.  Please show the CHEMOTHERAPY ALERT CARD or IMMUNOTHERAPY  ALERT CARD at check-in to the Emergency Department and triage nurse.  Should you have questions after your visit or need to cancel or reschedule your appointment, please contact [facility] - [facility]  Dept: [phone]  and follow the prompts.  Office hours are 8:00 a.m. to 4:30 p.m. Monday - Friday. Please note that voicemails left after 4:00 p.m. may not be returned until the following business day.  We are closed weekends and major holidays. You have access to a nurse at all times for urgent questions. Please call the main number to the clinic Dept: [phone] and follow the prompts.   For any non-urgent questions, you may also contact your provider using MyChart. We now offer e-Visits for anyone 73 and older to request care online for non-urgent symptoms. For details visit [email or web address].   Also download the MyChart app! Go to the app store, search "MyChart", open the app, select [place], and log in with your MyChart username and password.

## 2023-07-09 LAB — KAPPA/LAMBDA LIGHT CHAINS
Kappa free light chain: 16 mg/L (ref 3.3–19.4)
Kappa, lambda light chain ratio: 0.63 (ref 0.26–1.65)
Lambda free light chains: 25.5 mg/L (ref 5.7–26.3)

## 2023-07-13 ENCOUNTER — Inpatient Hospital Stay: Payer: Medicare (Managed Care)

## 2023-07-13 VITALS — BP 122/74 | HR 81 | Temp 98.2°F | Resp 18

## 2023-07-13 DIAGNOSIS — Z5111 Encounter for antineoplastic chemotherapy: Secondary | ICD-10-CM | POA: Diagnosis not present

## 2023-07-13 DIAGNOSIS — C9002 Multiple myeloma in relapse: Secondary | ICD-10-CM

## 2023-07-13 LAB — CMP (CANCER CENTER ONLY)
ALT: 16 U/L (ref 0–44)
AST: 13 U/L — ABNORMAL LOW (ref 15–41)
Albumin: 3.7 g/dL (ref 3.5–5.0)
Alkaline Phosphatase: 96 U/L (ref 38–126)
Anion gap: 8 (ref 5–15)
BUN: 17 mg/dL (ref 8–23)
CO2: 27 mmol/L (ref 22–32)
Calcium: 9.8 mg/dL (ref 8.9–10.3)
Chloride: 111 mmol/L (ref 98–111)
Creatinine: 0.81 mg/dL (ref 0.61–1.24)
GFR, Estimated: >60 mL/min (ref >60)
Glucose, Bld: 85 mg/dL (ref 70–99)
Potassium: 3.7 mmol/L (ref 3.5–5.1)
Sodium: 146 mmol/L — ABNORMAL HIGH (ref 135–145)
Total Bilirubin: 0.9 mg/dL (ref <1.2)
Total Protein: 6.2 g/dL — ABNORMAL LOW (ref 6.5–8.1)

## 2023-07-13 LAB — CBC WITH DIFFERENTIAL (CANCER CENTER ONLY)
Abs Immature Granulocytes: 0.08 10*3/uL — ABNORMAL HIGH (ref 0.00–0.07)
Basophils Absolute: 0 10*3/uL (ref 0.0–0.1)
Basophils Relative: 1 %
Eosinophils Absolute: 0.3 10*3/uL (ref 0.0–0.5)
Eosinophils Relative: 4 %
HCT: 33.5 % — ABNORMAL LOW (ref 39.0–52.0)
Hemoglobin: 11.6 g/dL — ABNORMAL LOW (ref 13.0–17.0)
Immature Granulocytes: 1 %
Lymphocytes Relative: 14 %
Lymphs Abs: 1.2 10*3/uL (ref 0.7–4.0)
MCH: 28.2 pg (ref 26.0–34.0)
MCHC: 34.6 g/dL (ref 30.0–36.0)
MCV: 81.3 fL (ref 80.0–100.0)
Monocytes Absolute: 1 10*3/uL (ref 0.1–1.0)
Monocytes Relative: 12 %
Neutro Abs: 5.7 10*3/uL (ref 1.7–7.7)
Neutrophils Relative %: 68 %
Platelet Count: 156 10*3/uL (ref 150–400)
RBC: 4.12 MIL/uL — ABNORMAL LOW (ref 4.22–5.81)
RDW: 17.9 % — ABNORMAL HIGH (ref 11.5–15.5)
WBC Count: 8.3 10*3/uL (ref 4.0–10.5)
nRBC: 0 % (ref 0.0–0.2)

## 2023-07-13 LAB — MULTIPLE MYELOMA PANEL, SERUM
Albumin SerPl Elph-Mcnc: 3.1 g/dL (ref 2.9–4.4)
Albumin/Glob SerPl: 1.2 (ref 0.7–1.7)
Alpha 1: 0.3 g/dL (ref 0.0–0.4)
Alpha2 Glob SerPl Elph-Mcnc: 0.8 g/dL (ref 0.4–1.0)
B-Globulin SerPl Elph-Mcnc: 1.1 g/dL (ref 0.7–1.3)
Gamma Glob SerPl Elph-Mcnc: 0.6 g/dL (ref 0.4–1.8)
Globulin, Total: 2.8 g/dL (ref 2.2–3.9)
IgA: 82 mg/dL (ref 61–437)
IgG (Immunoglobin G), Serum: 753 mg/dL (ref 603–1613)
IgM (Immunoglobulin M), Srm: 30 mg/dL (ref 20–172)
M Protein SerPl Elph-Mcnc: 0.2 g/dL — ABNORMAL HIGH
Total Protein ELP: 5.9 g/dL — ABNORMAL LOW (ref 6.0–8.5)

## 2023-07-13 MED ORDER — DEXTROSE 5 % IV SOLN
70.0000 mg/m2 | Freq: Once | INTRAVENOUS | Status: AC
  Administered 2023-07-13: 150 mg via INTRAVENOUS
  Filled 2023-07-13: qty 60

## 2023-07-13 MED ORDER — SODIUM CHLORIDE 0.9 % IV SOLN: Freq: Once | INTRAVENOUS | Status: AC

## 2023-07-13 MED ORDER — SODIUM CHLORIDE 0.9 % IV SOLN
40.0000 mg | Freq: Once | INTRAVENOUS | Status: AC
  Administered 2023-07-13: 40 mg via INTRAVENOUS
  Filled 2023-07-13: qty 4

## 2023-07-13 NOTE — Patient Instructions (Signed)
 [facility]  Discharge Instructions: Thank you for choosing [facility] to provide your oncology and hematology care.   If you have a lab appointment with the Cancer Center, please go directly to the Cancer Center and check in at the registration area.   Wear comfortable clothing and clothing appropriate for easy access to any Portacath or PICC line.   We strive to give you quality time with your provider. You may need to reschedule your appointment if you arrive late (15 or more minutes).  Arriving late affects you and other patients whose appointments are after yours.  Also, if you miss three or more appointments without notifying the office, you may be dismissed from the clinic at the provider's discretion.      For prescription refill requests, have your pharmacy contact our office and allow 72 hours for refills to be completed.    Today you received the following chemotherapy and/or immunotherapy agents: Kyprolis      To help prevent nausea and vomiting after your treatment, we encourage you to take your nausea medication as directed.  BELOW ARE SYMPTOMS THAT SHOULD BE REPORTED IMMEDIATELY: *FEVER GREATER THAN 100.4 F (38 C) OR HIGHER *CHILLS OR SWEATING *NAUSEA AND VOMITING THAT IS NOT CONTROLLED WITH YOUR NAUSEA MEDICATION *UNUSUAL SHORTNESS OF BREATH *UNUSUAL BRUISING OR BLEEDING *URINARY PROBLEMS (pain or burning when urinating, or frequent urination) *BOWEL PROBLEMS (unusual diarrhea, constipation, pain near the anus) TENDERNESS IN MOUTH AND THROAT WITH OR WITHOUT PRESENCE OF ULCERS (sore throat, sores in mouth, or a toothache) UNUSUAL RASH, SWELLING OR PAIN  UNUSUAL VAGINAL DISCHARGE OR ITCHING   Items with * indicate a potential emergency and should be followed up as soon as possible or go to the Emergency Department if any problems should occur.  Please show the CHEMOTHERAPY ALERT CARD or IMMUNOTHERAPY  ALERT CARD at check-in to the Emergency Department and triage nurse.  Should you have questions after your visit or need to cancel or reschedule your appointment, please contact [facility]  Dept: [phone]  and follow the prompts.  Office hours are 8:00 a.m. to 4:30 p.m. Monday - Friday. Please note that voicemails left after 4:00 p.m. may not be returned until the following business day.  We are closed weekends and major holidays. You have access to a nurse at all times for urgent questions. Please call the main number to the clinic Dept: [phone] and follow the prompts.   For any non-urgent questions, you may also contact your provider using MyChart. We now offer e-Visits for anyone 73 and older to request care online for non-urgent symptoms. For details visit [email or web address].   Also download the MyChart app! Go to the app store, search "MyChart", open the app, select [place], and log in with your MyChart username and password.

## 2023-07-18 ENCOUNTER — Other Ambulatory Visit: Payer: Self-pay

## 2023-07-20 ENCOUNTER — Encounter: Payer: Self-pay | Admitting: Hematology and Oncology

## 2023-07-20 ENCOUNTER — Inpatient Hospital Stay: Payer: Medicare (Managed Care)

## 2023-07-20 ENCOUNTER — Other Ambulatory Visit: Payer: Self-pay

## 2023-07-20 ENCOUNTER — Inpatient Hospital Stay (HOSPITAL_BASED_OUTPATIENT_CLINIC_OR_DEPARTMENT_OTHER): Payer: Medicare (Managed Care) | Admitting: Hematology and Oncology

## 2023-07-20 VITALS — BP 123/73 | HR 83 | Temp 97.5°F | Resp 16 | Ht 71.0 in | Wt 240.8 lb

## 2023-07-20 DIAGNOSIS — Z7901 Long term (current) use of anticoagulants: Secondary | ICD-10-CM

## 2023-07-20 DIAGNOSIS — C9002 Multiple myeloma in relapse: Secondary | ICD-10-CM

## 2023-07-20 DIAGNOSIS — Z5111 Encounter for antineoplastic chemotherapy: Secondary | ICD-10-CM

## 2023-07-20 LAB — CMP (CANCER CENTER ONLY)
ALT: 20 U/L (ref 0–44)
AST: 12 U/L — ABNORMAL LOW (ref 15–41)
Albumin: 3.7 g/dL (ref 3.5–5.0)
Alkaline Phosphatase: 89 U/L (ref 38–126)
Anion gap: 6 (ref 5–15)
BUN: 14 mg/dL (ref 8–23)
CO2: 26 mmol/L (ref 22–32)
Calcium: 9.5 mg/dL (ref 8.9–10.3)
Chloride: 111 mmol/L (ref 98–111)
Creatinine: 0.92 mg/dL (ref 0.61–1.24)
GFR, Estimated: >60 mL/min (ref >60)
Glucose, Bld: 121 mg/dL — ABNORMAL HIGH (ref 70–99)
Potassium: 3.9 mmol/L (ref 3.5–5.1)
Sodium: 143 mmol/L (ref 135–145)
Total Bilirubin: 0.6 mg/dL (ref <1.2)
Total Protein: 6.4 g/dL — ABNORMAL LOW (ref 6.5–8.1)

## 2023-07-20 LAB — CBC WITH DIFFERENTIAL (CANCER CENTER ONLY)
Abs Immature Granulocytes: 0.04 10*3/uL (ref 0.00–0.07)
Basophils Absolute: 0 10*3/uL (ref 0.0–0.1)
Basophils Relative: 0 %
Eosinophils Absolute: 0.3 10*3/uL (ref 0.0–0.5)
Eosinophils Relative: 5 %
HCT: 33.2 % — ABNORMAL LOW (ref 39.0–52.0)
Hemoglobin: 11.3 g/dL — ABNORMAL LOW (ref 13.0–17.0)
Immature Granulocytes: 1 %
Lymphocytes Relative: 12 %
Lymphs Abs: 0.8 10*3/uL (ref 0.7–4.0)
MCH: 27.8 pg (ref 26.0–34.0)
MCHC: 34 g/dL (ref 30.0–36.0)
MCV: 81.8 fL (ref 80.0–100.0)
Monocytes Absolute: 0.7 10*3/uL (ref 0.1–1.0)
Monocytes Relative: 10 %
Neutro Abs: 5.1 10*3/uL (ref 1.7–7.7)
Neutrophils Relative %: 72 %
Platelet Count: 171 10*3/uL (ref 150–400)
RBC: 4.06 MIL/uL — ABNORMAL LOW (ref 4.22–5.81)
RDW: 18 % — ABNORMAL HIGH (ref 11.5–15.5)
WBC Count: 7 10*3/uL (ref 4.0–10.5)
nRBC: 0 % (ref 0.0–0.2)

## 2023-07-20 MED ORDER — SODIUM CHLORIDE 0.9 % IV SOLN
Freq: Once | INTRAVENOUS | Status: AC
Start: 2023-07-20 — End: 2023-07-20

## 2023-07-20 MED ORDER — SODIUM CHLORIDE 0.9 % IV SOLN
40.0000 mg | Freq: Once | INTRAVENOUS | Status: AC
  Administered 2023-07-20: 40 mg via INTRAVENOUS
  Filled 2023-07-20: qty 4

## 2023-07-20 MED ORDER — DEXTROSE 5 % IV SOLN
70.0000 mg/m2 | Freq: Once | INTRAVENOUS | Status: AC
  Administered 2023-07-20: 150 mg via INTRAVENOUS
  Filled 2023-07-20: qty 60

## 2023-07-20 NOTE — Patient Instructions (Signed)
 [facility]  Discharge Instructions: Thank you for choosing [facility] to provide your oncology and hematology care.   If you have a lab appointment with the Cancer Center, please go directly to the Cancer Center and check in at the registration area.   Wear comfortable clothing and clothing appropriate for easy access to any Portacath or PICC line.   We strive to give you quality time with your provider. You may need to reschedule your appointment if you arrive late (15 or more minutes).  Arriving late affects you and other patients whose appointments are after yours.  Also, if you miss three or more appointments without notifying the office, you may be dismissed from the clinic at the provider's discretion.      For prescription refill requests, have your pharmacy contact our office and allow 72 hours for refills to be completed.    Today you received the following chemotherapy and/or immunotherapy agents: Kyprolis      To help prevent nausea and vomiting after your treatment, we encourage you to take your nausea medication as directed.  BELOW ARE SYMPTOMS THAT SHOULD BE REPORTED IMMEDIATELY: *FEVER GREATER THAN 100.4 F (38 C) OR HIGHER *CHILLS OR SWEATING *NAUSEA AND VOMITING THAT IS NOT CONTROLLED WITH YOUR NAUSEA MEDICATION *UNUSUAL SHORTNESS OF BREATH *UNUSUAL BRUISING OR BLEEDING *URINARY PROBLEMS (pain or burning when urinating, or frequent urination) *BOWEL PROBLEMS (unusual diarrhea, constipation, pain near the anus) TENDERNESS IN MOUTH AND THROAT WITH OR WITHOUT PRESENCE OF ULCERS (sore throat, sores in mouth, or a toothache) UNUSUAL RASH, SWELLING OR PAIN  UNUSUAL VAGINAL DISCHARGE OR ITCHING   Items with * indicate a potential emergency and should be followed up as soon as possible or go to the Emergency Department if any problems should occur.  Please show the CHEMOTHERAPY ALERT CARD or IMMUNOTHERAPY  ALERT CARD at check-in to the Emergency Department and triage nurse.  Should you have questions after your visit or need to cancel or reschedule your appointment, please contact [facility]  Dept: [phone]  and follow the prompts.  Office hours are 8:00 a.m. to 4:30 p.m. Monday - Friday. Please note that voicemails left after 4:00 p.m. may not be returned until the following business day.  We are closed weekends and major holidays. You have access to a nurse at all times for urgent questions. Please call the main number to the clinic Dept: [phone] and follow the prompts.   For any non-urgent questions, you may also contact your provider using MyChart. We now offer e-Visits for anyone 73 and older to request care online for non-urgent symptoms. For details visit [email or web address].   Also download the MyChart app! Go to the app store, search "MyChart", open the app, select [place], and log in with your MyChart username and password.

## 2023-07-20 NOTE — Progress Notes (Signed)
[facility] [phone]   [phone]  PROGRESS NOTE  Patient Care Team: Jethro Bastos, MD as PCP - General (Family Medicine) Ranelle Oyster, MD as Consulting Physician (Physical Medicine and Rehabilitation) Hildred Alamin Mcarthur Rossetti, PA-C as Physician Assistant (Physical Medicine and Rehabilitation) Jaci Standard, MD as Consulting Physician (Hematology and Oncology) Jaci Standard, MD as Consulting Physician (Hematology and Oncology) Jaci Standard, MD as Consulting Physician (Hematology and Oncology)  Hematological/Oncological History # IgG Lambda Multiple Myeloma, Relapsed (ISS Stage II) 1) 06/2010: initial diagnosis of Multiple Myeloma after T8 compression fracture. Treated with Velcade/Revlimid/Dexamethasone and achieved a complete remission 2) Velcade was discontinued in September 2012 and that Revlimid and Decadron were discontinued in March 2013. 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 4) Followed by Dr. Bertis Ruddy, last clinic visit 10/09/2019. At that time there was concern for relapse of his multiple myeloma.  5) Patient requested transfer to different provider after misunderstanding regarding imaging studies 6) 12/17/2019: transfer care to Dr. Leonides Schanz  7) 01/09/2020: Cycle 1 Day 1 of Dara/Velcade/Dex 8) 01/21/2020: presented as urgent visit for diarrhea and dehydration. Holding chemotherapy scheduled for 01/23/2020. 9) 01/30/2020: Resume dara/velcade/dex after resolution of diarrhea.  10) 02/13/2020: restaging labs show M protein 0.8, Kappa 4.5, lamba 17.2, ratio 0.26, urine M protein 53 (7.1%). All MM labs indicate improvement.  11) 03/10/2020: Cycle 4 Day 1 of Dara/Velcade/Dex. Transition to q 3 week daratumumab.  12) 06/04/2020:  Cycle 8 Day 1 of Dara/Velcade/Dex 13) 06/25/2020:  Cycle 9 Day 1 of Dara/Velcade/Dex 14) 07/22/2020: Cycle 10 Day 1  of  Dara//Dex 15) 08/18/2020: Cycle 11 Day 1 of Dara//Dex 16) 09/23/2020: Cycle 12 Day 1 of Dara//Dex 17) 10/22/2020: Cycle 13 Day 1 of Dara/Dex  18) 11/19/2020: Cycle 14 Day 1 of Dara/Dex  19) 12/17/2020: Cycle 15 Day 1 of Dara/Dex  20) 01/17/2021: Cycle 16 Day 1 of Dara/Dex  21) 02/11/2021: Cycle 17 Day 1 of Dara/Dex 22) 03/10/2021: Cycle 18 Day 1 of Dara/Dex  23) 04/08/2021: Cycle 19 Day 1 of Dara/Dex  24) 08/03/2021: Cycle 20 Day 1 of Dara/Dex (delayed due to scheduling error) 25) 09/02/2021: Cycle 21 Day 1 of Dara/Dex 26) 09/30/2021: Cycle 22 Day 1 of Dara/Dex 27) 10/28/2021: Cycle 23 Day 1 of Dara/Dex 28) 12/02/2021: Cycle 24 Day 1 of Dara/Dex 29) 12/30/2021: Cycle 25 Day 1 of Dara/Dex 30) 01/06/2022: Cycle 1 Day 1 of Kyprolis/Dex 31) 02/03/2022: Cycle 2 Day 1 of Kyprolis/Dex 32) 02/24/2022: Cycle 3 Day 1 of Kyprolis/Dex 33) 04/07/2022: Cycle 4 Day 1 of Kyprolis/Dex 34) 05/05/2022: Cycle 5 Day 1 of Kyprolis/Dex 35) 06/01/2022: Cycle 6 Day 1 of Kyprolis/Dex 36) 06/29/2022: Cycle 7 Day 1 of Kyprolis/Dex 37) 07/28/2022: Cycle 8 Day 1 of Kyprolis/Dex 38) 08/25/2022: Cycle 9 Day 1 of Kyprolis/Dex 39) 09/22/2022: Cycle 10 Day 1 of Kyprolis/Dex 40) 10/20/2022: Cycle 11 Day 1 of Kyprolis/Dex 41) 11/17/2022: Cycle 12 Day 1 of Kyprolis/Dex 42 ) 12/22/2022-01/08/2023: Admitted for fournier's gangrene of scrotum.  43) 01/26/2023: Cycle 13 Day 1 of Kyprolis/Dex 44) 02/16/2023: Cycle 14 Day 1 of Kyprolis/Dex 45) 03/16/2023:Cycle 15 Day 1 of Kyprolis/Dex 46) 04/13/2023: Cycle 16 Day 1 of Kyprolis/Dex 47) 05/11/2023: Cycle 17 Day 1 of Kyprolis/Dex 48) 06/07/2023: Cycle 18 Day 1 of Kyprolis/Dex 49) 07/06/2023: Cycle 19 Day 1 of Kyprolis/Dex  Interval History:  [name] 67 y.o. male with medical history significant  for  IgG Lambda Multiple Myeloma who presents for a follow up visit. The patient's last visit was on 07/06/2023.  He presents today prior to treatment with Kyprolis/Dex. He is unaccompanied for this visit.    On exam today [name] reports he was recently started on an antibiotic therapy for an abscess in his groin area.  He reports this is being followed closely.  He reports he is tolerating his Kyprolis treatment well with no major side effects.  He is not having any numbness or tingling of his fingers and also denies any nausea, vomiting, or diarrhea.  He reports he has been working out using 7 pound dumbbells.  He reports that his appetite has been strong and his energy levels have been good.  Overall he is willing and able to proceed with Kyprolis therapy at this time.  He denies fevers, chills, night sweats, shortness of breath, chest pain or cough.  He has no other complaints.  Rest of the 10 point ROS is below.  MEDICAL HISTORY:  Past Medical History:  Diagnosis Date   Adrenal insufficiency (HCC)    on chronic dexamethasone   Anemia    Cancer (HCC)    Coagulopathy (HCC)    on xeralto/ s/p DVT while on coumadin,  IVC in place   Diabetes mellitus without complication (HCC)    type 2   Glaucoma 12/22/2022   Gross hematuria 01/2013   post foley cath procedure   History of blood transfusion 01/2013   Lower GI bleeding 04/18/2017   Multiple myeloma    thoracic T8 with paraplegia s/p resection- on chemo at visit 10/13/10   Multiple myeloma    Multiple myeloma without mention of remission    Neurogenic bladder    Neurogenic bowel    OSA (obstructive sleep apnea) 11/01/2022   Paraplegia (HCC)    Partial small bowel obstruction (HCC) during dec 2011 admission    SURGICAL HISTORY: Past Surgical History:  Procedure Laterality Date   COLONOSCOPY WITH PROPOFOL N/A 04/12/2017   Procedure: COLONOSCOPY WITH PROPOFOL;  Surgeon: Hilarie Fredrickson, MD;  [facility];  Service: Endoscopy;  Laterality: N/A;   COLONOSCOPY WITH PROPOFOL N/A 04/19/2017   Procedure: COLONOSCOPY WITH PROPOFOL;  Surgeon: Benancio Deeds, MD;  Location: [facility];  Service: Gastroenterology;  Laterality:  N/A;   COLOSTOMY  07/20/2011   Procedure: COLOSTOMY;  Surgeon: Rulon Abide, DO;  Location: [facility] OR;  Service: General;;   COLOSTOMY REVISION  07/20/2011   Procedure: COLON RESECTION SIGMOID;  Surgeon: Rulon Abide, DO;  Location: [facility] OR;  Service: General;;   CYSTOSCOPY N/A 04/04/2013   Procedure: CYSTOSCOPY WITH LITHALOPAXY;  Surgeon: Sebastian Ache, MD;  [facility];  Service: Urology;  Laterality: N/A;   INCISION AND DRAINAGE ABSCESS N/A 12/25/2022   Procedure: INCISION AND DRAINAGE OF SCROTUM;  Surgeon: Despina Arias, MD;  Location[facility];  Service: Urology;  Laterality: N/A;   INSERTION OF SUPRAPUBIC CATHETER N/A 04/04/2013   Procedure: INSERTION OF SUPRAPUBIC CATHETER;  Surgeon: Sebastian Ache, MD;  Location: [facility];  Service: Urology;  Laterality: N/A;   LAPAROTOMY  07/20/2011   Procedure: EXPLORATORY LAPAROTOMY;  Surgeon: Rulon Abide, DO;  Location: [facility] OR;  Service: General;  Laterality: N/A;   myeloma thoracic T8 with parpaplegia s/p thoracotomy and thoracic T7-9 cage placement on Dec 26th 2011  07/18/10   SCROTAL EXPLORATION N/A 12/29/2022   Procedure: SCROTAL WOUND DEBRIDEMENT AND CLOSURE;  Surgeon: Loletta Parish., MD;  Location[facility];  Service: Urology;  Laterality: N/A;    SOCIAL HISTORY: Social History   Socioeconomic History   Marital status: Married    Spouse name: Not on file   Number of children: Not on file   Years of education: Not on file   Highest education level: Not on file  Occupational History   Not on file  Tobacco Use   Smoking status: Never   Smokeless tobacco: Never  Vaping Use   Vaping status: Never Used  Substance and Sexual Activity   Alcohol use: No   Drug use: No   Sexual activity: Never  Other Topics Concern   Not on file  Social History Narrative   Not on file   Social Drivers of Health   Financial Resource Strain: Not on file  Food Insecurity: No Food Insecurity (12/22/2022)   Hunger Vital Sign     Worried About Running Out of Food in the Last Year: Never true    Ran Out of Food in the Last Year: Never true  Transportation Needs: No Transportation Needs (12/22/2022)   PRAPARE - Administrator, Civil Service (Medical): No    Lack of Transportation (Non-Medical): No  Physical Activity: Not on file  Stress: Not on file  Social Connections: Not on file  Intimate Partner Violence: Not At Risk (12/22/2022)   Humiliation, Afraid, Rape, and Kick questionnaire    Fear of Current or Ex-Partner: No    Emotionally Abused: No    Physically Abused: No    Sexually Abused: No    FAMILY HISTORY: Family History  Problem Relation Age of Onset   Ovarian cancer Mother    Diabetes Father     ALLERGIES:  is allergic to ferumoxytol.  MEDICATIONS:  Current Outpatient Medications  Medication Sig Dispense Refill   acetaminophen (TYLENOL) 325 MG tablet Take 325-650 mg by mouth every 6 (six) hours as needed for headache or mild pain.     acetic acid 0.25 % irrigation Irrigate with 1 Application as directed See admin instructions. Instill 50 ml's, clamp for 10 minutes, then remove clamp and drain the bladder. Repeat 3 times a week- when not using the Renacidin irrigation     acyclovir (ZOVIRAX) 400 MG tablet Take 1 tablet (400 mg total) by mouth 2 (two) times daily. 60 tablet 2   albuterol (VENTOLIN HFA) 108 (90 Base) MCG/ACT inhaler Inhale 2 puffs into the lungs every 4 (four) hours as needed for wheezing or shortness of breath.     atorvastatin (LIPITOR) 20 MG tablet Take 20 mg by mouth daily.     BENADRYL ALLERGY 25 MG tablet Take 25 mg by mouth every 6 (six) hours as needed (for allergic reactions).     BIOFREEZE 4 % GEL Apply 1 application  topically every 4 (four) hours as needed (for joint pain- to intact areas of the skin only).     Citric Ac-Gluconolact-Mg Carb (RENACIDIN IR) Irrigate with 30 mLs as directed See admin instructions. Instill 30 ml's, clamp for 10 minutes, then remove  clamp and drain the bladder. Repeat 3 times a week- when not using the acetic acid irrigation     COSOPT PF 2-0.5 % SOLN Place 1 drop into both eyes 2 (two) times daily.     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     Emollient (EUCERIN EX) Apply 1 application  topically  See admin instructions. Apply to affected area 2 times a day     FLOVENT HFA 110 MCG/ACT inhaler 2 each See admin instructions. 2 sprays to colostomy stoma prior to each colostomy bag change     iron polysaccharides (NU-IRON) 150 MG capsule Take 1 capsule (150 mg total) by mouth daily. 30 capsule 0   latanoprost (XALATAN) 0.005 % ophthalmic solution Place 1 drop into both eyes at bedtime.     Multiple Vitamin (MULTIVITAMIN WITH MINERALS) TABS tablet Take 1 tablet by mouth daily. 30 tablet 0   NON FORMULARY Apply 1 application  topically See admin instructions. Colo Plast paste- Apply as directed with each ostomy bag change     ondansetron (ZOFRAN) 4 MG tablet Take 1 tablet (4 mg) by mouth every 6 hours as needed for nausea. 20 tablet 0   rivaroxaban (XARELTO) 10 MG TABS tablet Take 1 tablet (10 mg total) by mouth daily with supper. (Patient taking differently: Take 10 mg by mouth daily.) 30 tablet 9   Skin Protectants, Misc. (MINERIN CREME) CREA Apply to affected areas 2 times daily as needed. 454 g 0   Zinc Oxide (BALMEX EX) Apply 1 application  topically See admin instructions. Apply topically daily to affected sites     No current facility-administered medications for this visit.   Facility-Administered Medications Ordered in Other Visits  Medication Dose Route Frequency Provider Last Rate Last Admin   sodium chloride flush (NS) 0.9 % injection 10 mL  10 mL Intravenous PRN Bertis Ruddy, Ni, MD        REVIEW OF SYSTEMS:   Constitutional: ( - ) fevers, ( - )  chills , ( - ) night sweats Eyes: ( - ) blurriness of vision, ( - ) double vision, ( - )  watery eyes Ears, nose, mouth, throat, and face: ( - ) mucositis, ( - ) sore throat Respiratory: ( - ) cough, ( - ) dyspnea, ( - ) wheezes Cardiovascular: ( - ) palpitation, ( - ) chest discomfort, ( - ) lower extremity swelling Gastrointestinal:  ( - ) nausea, ( - ) heartburn, ( - ) change in bowel habits Skin: ( - ) abnormal skin rashes Lymphatics: ( - ) new lymphadenopathy, ( - ) easy bruising Neurological: ( - ) numbness, ( - ) tingling, ( - ) new weaknesses Behavioral/Psych: ( - ) mood change, ( - ) new changes  All other systems were reviewed with the patient and are negative.  PHYSICAL EXAMINATION: ECOG PERFORMANCE STATUS: paraplegic.   Vitals:   07/20/23 1034  BP: 123/73  Pulse: 83  Resp: 16  Temp: (!) 97.5 F (36.4 C)  SpO2: 100%     Filed Weights   07/20/23 1034  Weight: 240 lb 12.8 oz (109.2 kg)    GENERAL: well appearing middle aged [place] American male alert, no distress and comfortable SKIN: skin color, texture, turgor are normal, no rashes or significant lesions EYES: conjunctiva are pink and non-injected, sclera clear LUNGS: clear to auscultation and percussion with normal breathing effort HEART: regular rate & rhythm and no murmurs Musculoskeletal: no cyanosis of digits and no clubbing  PSYCH: alert & oriented x 3, fluent speech NEURO: paraplegic, no use of LE bilaterally.   LABORATORY DATA:  I have reviewed the data as listed    Latest Ref Rng & Units 07/20/2023    9:56 AM 07/13/2023    9:57 AM 07/06/2023    9:31 AM  CBC  WBC 4.0 - 10.5 K/uL 7.0  8.3  7.9   Hemoglobin 13.0 - 17.0 g/dL 54.0  98.1  19.1   Hematocrit 39.0 - 52.0 % 33.2  33.5  33.9   Platelets 150 - 400 K/uL 171  156  394        Latest Ref Rng & Units 07/20/2023    9:56 AM 07/13/2023    9:57 AM 07/06/2023    9:31 AM  CMP  Glucose 70 - 99 mg/dL 478  85  295   BUN 8 - 23 mg/dL 14  17  11    Creatinine 0.61 - 1.24 mg/dL 6.21  3.08  6.57   Sodium 135 - 145 mmol/L 143  146  143    Potassium 3.5 - 5.1 mmol/L 3.9  3.7  3.8   Chloride 98 - 111 mmol/L 111  111  109   CO2 22 - 32 mmol/L 26  27  26    Calcium 8.9 - 10.3 mg/dL 9.5  9.8  9.6   Total Protein 6.5 - 8.1 g/dL 6.4  6.2  6.7   Total Bilirubin <1.2 mg/dL 0.6  0.9  0.8   Alkaline Phos 38 - 126 U/L 89  96  87   AST 15 - 41 U/L 12  13  16    ALT 0 - 44 U/L 20  16  18      Lab Results  Component Value Date   MPROTEIN 0.2 (H) 07/06/2023   MPROTEIN 0.2 (H) 06/07/2023   MPROTEIN 0.3 (H) 05/11/2023   Lab Results  Component Value Date   KPAFRELGTCHN 16.0 07/06/2023   KPAFRELGTCHN 11.1 06/07/2023   KPAFRELGTCHN 14.2 05/11/2023   LAMBDASER 25.5 07/06/2023   LAMBDASER 6.6 06/07/2023   LAMBDASER 10.9 05/11/2023   KAPLAMBRATIO 0.63 07/06/2023   KAPLAMBRATIO 1.68 (H) 06/07/2023   KAPLAMBRATIO 1.30 05/11/2023    RADIOGRAPHIC STUDIES: No results found.  ASSESSMENT & PLAN [name] 67 y.o. male with medical history significant for  IgG Lambda Multiple Myeloma who presents for a follow up visit.  # IgG Lambda Multiple Myeloma, Relapsed (ISS Stage II) --findings are most consistent with relapsed multiple myeloma. Patient previously successfully treated with Velcade/Rev/Dex and Daratumumab/Velcade/Dex. On 07/02/2020 he transitioned to monthly daratumumab alone.  --due to to rise in M protein, switched to Kyprolis and Dexamethasone on 01/06/2022 Plan: --Due for Cycle 19, Day 15 of Kyprolis/Dex today  --Labs show white blood cell count 7.0, hemoglobin 11.3, MCV 81.8, platelets 171. Creatinine and LFTs in range.  --Most recent myeloma labs from 07/06/2023 showed M protein of 0.2 with normal serum free light chains.  --Proceed with treatment today without any dose modifications. --Continue with weekly labs plus Kyprolis/Dex treatment as scheduled  #Memory loss: --Encouraged to do memory exercises including puzzles, suduko, etc --If symptoms worsen, we can make a referral to neurology for further evaluation.    #History of Sepsis 2/2 UTI and Fournier's gangrene of scrotum: --Hospitalized from 12/22/2022-01/08/2023. Underwent debridement of necrotic tissue. --Given PIP tazobactam and Zyvox initially --Urine culture reveals Proteus mirabilis which is resistant to ciprofloxacin and nitrofurantoin --Blood cultures negative --D/C on remainder doxycycline and Augmentin which he completed on 01/07/2023.  --Evaluated by urology on 7/2 and ID on 7/3 with continued wound healing and no further d/c or evidence of infection.   #History of DVT --He had placement of IVC filter, remains on Xarelto. --Due to poor mobility, and lack of bleeding complications, recommend to continue on Xarelto indefinitely. --caution if Plt count were to drop <50  #Snoring/Risk for OSA: --  Diagnosed with OSA and has CPAP machine.   # Supportive Care -- provided patient with an albuterol inhaler (for use with daratumumab) --acyclovir 400mg  BID for VZV prophylaxis --zofran 8mg  q8H PRN for nausea/vomiting  --Zometa is being held in the setting of his prior episode of osteonecrosis of the jaw  No orders of the defined types were placed in this encounter.   All questions were answered. The patient knows to call the clinic with any problems, questions or concerns.  I have spent a total of 30 minutes minutes of face-to-face and non-face-to-face time, preparing to see the patient,  performing a medically appropriate examination, counseling and educating the patient, communicating with other health care professionals, documenting clinical information in the electronic health record,  and care coordination.   Ulysees Barns, MD Department of Hematology/Oncology [facility] at [facility] Phone: [phone] Pager: [phone] Email: [email or web address]  07/20/2023 1:24 PM   Literature Support:  Milagros Loll, Petrucci [place], [place], [place], [place], [place], Spada S, [place],  Ponticelli E, [place], Cavo M, Di Toritto TC, Haydee Salter F, Monte[place] V, Palumbo A, Boccadoro M, Larocca A. Once-weekly versus twice-weekly carfilzomib in patients with newly diagnosed multiple myeloma: a pooled analysis of two phase I/II studies. Haematologica. 2019 Aug;104(8):1640-1647.  --Once-weekly 70 mg/m2 carfilzomib as induction and maintenance therapy for newly diagnosed multiple myeloma patients was as safe and effective as twice-weekly 36 mg/m2 carfilzomib and provided a more convenient schedule.

## 2023-08-02 ENCOUNTER — Inpatient Hospital Stay: Payer: Medicare (Managed Care)

## 2023-08-02 ENCOUNTER — Inpatient Hospital Stay (HOSPITAL_BASED_OUTPATIENT_CLINIC_OR_DEPARTMENT_OTHER): Payer: Medicare (Managed Care) | Admitting: Nurse Practitioner

## 2023-08-02 ENCOUNTER — Inpatient Hospital Stay: Payer: Medicare (Managed Care) | Attending: Physician Assistant

## 2023-08-02 VITALS — BP 133/76 | HR 83 | Temp 98.9°F | Resp 15 | Wt 243.6 lb

## 2023-08-02 DIAGNOSIS — C9002 Multiple myeloma in relapse: Secondary | ICD-10-CM

## 2023-08-02 DIAGNOSIS — Z5111 Encounter for antineoplastic chemotherapy: Secondary | ICD-10-CM | POA: Insufficient documentation

## 2023-08-02 LAB — CMP (CANCER CENTER ONLY)
ALT: 17 U/L (ref 0–44)
AST: 13 U/L — ABNORMAL LOW (ref 15–41)
Albumin: 4 g/dL (ref 3.5–5.0)
Alkaline Phosphatase: 100 U/L (ref 38–126)
Anion gap: 6 (ref 5–15)
BUN: 11 mg/dL (ref 8–23)
CO2: 27 mmol/L (ref 22–32)
Calcium: 9.6 mg/dL (ref 8.9–10.3)
Chloride: 112 mmol/L — ABNORMAL HIGH (ref 98–111)
Creatinine: 0.75 mg/dL (ref 0.61–1.24)
GFR, Estimated: >60 mL/min (ref >60)
Glucose, Bld: 91 mg/dL (ref 70–99)
Potassium: 4 mmol/L (ref 3.5–5.1)
Sodium: 145 mmol/L (ref 135–145)
Total Bilirubin: 0.7 mg/dL (ref 0.0–1.2)
Total Protein: 6.6 g/dL (ref 6.5–8.1)

## 2023-08-02 LAB — CBC WITH DIFFERENTIAL (CANCER CENTER ONLY)
Abs Immature Granulocytes: 0.06 10*3/uL (ref 0.00–0.07)
Basophils Absolute: 0.1 10*3/uL (ref 0.0–0.1)
Basophils Relative: 1 %
Eosinophils Absolute: 0.2 10*3/uL (ref 0.0–0.5)
Eosinophils Relative: 3 %
HCT: 32.5 % — ABNORMAL LOW (ref 39.0–52.0)
Hemoglobin: 11.1 g/dL — ABNORMAL LOW (ref 13.0–17.0)
Immature Granulocytes: 1 %
Lymphocytes Relative: 13 %
Lymphs Abs: 1 10*3/uL (ref 0.7–4.0)
MCH: 28.3 pg (ref 26.0–34.0)
MCHC: 34.2 g/dL (ref 30.0–36.0)
MCV: 82.9 fL (ref 80.0–100.0)
Monocytes Absolute: 0.9 10*3/uL (ref 0.1–1.0)
Monocytes Relative: 11 %
Neutro Abs: 5.7 10*3/uL (ref 1.7–7.7)
Neutrophils Relative %: 71 %
Platelet Count: 350 10*3/uL (ref 150–400)
RBC: 3.92 MIL/uL — ABNORMAL LOW (ref 4.22–5.81)
RDW: 18.2 % — ABNORMAL HIGH (ref 11.5–15.5)
WBC Count: 7.9 10*3/uL (ref 4.0–10.5)
nRBC: 0 % (ref 0.0–0.2)

## 2023-08-02 MED ORDER — SODIUM CHLORIDE 0.9 % IV SOLN: Freq: Once | INTRAVENOUS | Status: AC

## 2023-08-02 MED ORDER — SODIUM CHLORIDE 0.9 % IV SOLN
40.0000 mg | Freq: Once | INTRAVENOUS | Status: AC
  Administered 2023-08-02: 40 mg via INTRAVENOUS
  Filled 2023-08-02: qty 4

## 2023-08-02 MED ORDER — DEXTROSE 5 % IV SOLN
70.0000 mg/m2 | Freq: Once | INTRAVENOUS | Status: AC
  Administered 2023-08-02: 150 mg via INTRAVENOUS
  Filled 2023-08-02: qty 60

## 2023-08-02 NOTE — Assessment & Plan Note (Addendum)
 IgG Lambda Multiple Myeloma, Relapsed (ISS Stage II) --findings are most consistent with relapsed multiple myeloma. Patient previously successfully treated with Velcade /Rev/Dex and Daratumumab /Velcade /Dex. On 07/02/2020 he transitioned to monthly daratumumab  alone.  --due to to rise in M protein, switched to Kyprolis  and Dexamethasone  on 01/06/2022 Plan: --Due for Cycle 20, Day 1 of Kyprolis /Dex today  --Labs show white blood cell count 7.9, Hgb 12.4, MCV 82.4, Plt 142. Creatinine and LFTs in range.  --Most recent myeloma labs from 07/06/2023 showed M protein of 0.2 with normal serum free light chains.  --Proceed with treatment today without any dose modifications. --Continue with weekly labs plus Kyprolis /Dex treatment as scheduled

## 2023-08-02 NOTE — Progress Notes (Signed)
 Patient Care Team: Alona Lamar SAILOR, MD as PCP - General (Family Medicine) Babs Arthea DASEN, MD as Consulting Physician (Physical Medicine and Rehabilitation) Pegge Toribio PARAS, PA-C as Physician Assistant (Physical Medicine and Rehabilitation) Federico Norleen DASEN MADISON, MD as Consulting Physician (Hematology and Oncology) Federico Norleen DASEN MADISON, MD as Consulting Physician (Hematology and Oncology) Federico Norleen DASEN MADISON, MD as Consulting Physician (Hematology and Oncology)  Clinic Day:  08/05/2023  Referring physician: Federico Norleen DASEN MADISON, MD  ASSESSMENT & PLAN:   Assessment & Plan: Multiple myeloma in relapse (HCC)  IgG Lambda Multiple Myeloma, Relapsed (ISS Stage II) --findings are most consistent with relapsed multiple myeloma. Patient previously successfully treated with Velcade /Rev/Dex and Daratumumab /Velcade /Dex. On 07/02/2020 he transitioned to monthly daratumumab  alone.  --due to to rise in M protein, switched to Kyprolis  and Dexamethasone  on 01/06/2022 Plan: --Due for Cycle 20, Day 1 of Kyprolis /Dex today  --Labs show white blood cell count 7.9, Hgb 12.4, MCV 82.4, Plt 142. Creatinine and LFTs in range.  --Most recent myeloma labs from 07/06/2023 showed M protein of 0.2 with normal serum free light chains.  --Proceed with treatment today without any dose modifications. --Continue with weekly labs plus Kyprolis /Dex treatment as scheduled    Plan: Labs reviewed  -CBC showing WBC 7.9; Hgb 11.9; Hct 32.5; MCV 82.9; plt 380; Anc 5.7 -CMP - K 4.0; glucose 91; BUN 11; Creatinine 0.75; eGFR > 60; Ca 9.4; LFTs normal.   -Myeloma labs from 07/06/2023 show M protein of 0.2 and normal serum free light chains. Patient condition and labs are adequate for treatment today.  Proceed with Kyprolis /dexamethasone . Labs with follow-up and treatment as scheduled.  The patient understands the plans discussed today and is in agreement with them.  He knows to contact our office if he develops concerns prior to his  next appointment.  I provided 20 minutes of face-to-face time during this encounter and > 50% was spent counseling as documented under my assessment and plan.    Powell FORBES Lessen, NP  [facility] - A DEPT OF [facility] [address] [place] [address] Dept: [phone] Dept Fax: [phone]   No orders of the defined types were placed in this encounter.     CHIEF COMPLAINT:  CC: Igg Lambda myeloma   Current Treatment:  Kyprolis  and dexamethasone    INTERVAL HISTORY:  [name] is here today for repeat clinical assessment. He last saw Dr. Federico on 07/20/2023. Myeloma labs have been stable with normal free light chains. Today is cycle 20 day 1. States that he has been unable to tolerate CPAP and turned it back into  the supply company. He denies fevers or chills. He denies pain. His appetite is good. His weight has increased 3 pounds over last 2 weeks .  I have reviewed the past medical history, past surgical history, social history and family history with the patient and they are unchanged from previous note.  ALLERGIES:  is allergic to ferumoxytol .  MEDICATIONS:  Current Outpatient Medications  Medication Sig Dispense Refill   acetaminophen  (TYLENOL ) 325 MG tablet Take 325-650 mg by mouth every 6 (six) hours as needed for headache or mild pain.     acetic acid 0.25 % irrigation Irrigate with 1 Application as directed See admin instructions. Instill 50 ml's, clamp for 10 minutes, then remove clamp and drain the bladder. Repeat 3 times a week- when not using the Renacidin irrigation  acyclovir  (ZOVIRAX ) 400 MG tablet Take 1 tablet (400 mg total) by mouth 2 (two) times daily. 60 tablet 2   albuterol  (VENTOLIN  HFA) 108 (90 Base) MCG/ACT inhaler Inhale 2 puffs into the lungs every 4 (four) hours as needed for wheezing or shortness of breath.     atorvastatin  (LIPITOR) 20 MG tablet Take 20 mg by mouth daily.      BENADRYL  ALLERGY 25 MG tablet Take 25 mg by mouth every 6 (six) hours as needed (for allergic reactions).     BIOFREEZE 4 % GEL Apply 1 application  topically every 4 (four) hours as needed (for joint pain- to intact areas of the skin only).     Citric Ac-Gluconolact-Mg Carb (RENACIDIN IR) Irrigate with 30 mLs as directed See admin instructions. Instill 30 ml's, clamp for 10 minutes, then remove clamp and drain the bladder. Repeat 3 times a week- when not using the acetic acid irrigation     COSOPT  PF 2-0.5 % SOLN Place 1 drop into both eyes 2 (two) times daily.     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     Emollient (EUCERIN EX) Apply 1 application  topically See admin instructions. Apply to affected area 2 times a day     FLOVENT HFA 110 MCG/ACT inhaler 2 each See admin instructions. 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 30 capsule 0   latanoprost  (XALATAN ) 0.005 % ophthalmic solution Place 1 drop into both eyes at bedtime.     Multiple Vitamin (MULTIVITAMIN WITH MINERALS) TABS tablet Take 1 tablet by mouth daily. 30 tablet 0   NON FORMULARY Apply 1 application  topically See admin instructions. Colo Plast paste- Apply as directed with each ostomy bag change     ondansetron  (ZOFRAN ) 4 MG tablet Take 1 tablet (4 mg) by mouth every 6 hours as needed for nausea. 20 tablet 0   rivaroxaban  (XARELTO ) 10 MG TABS tablet Take 1 tablet (10 mg total) by mouth daily with supper. (Patient taking differently: Take 10 mg by mouth daily.) 30 tablet 9   Skin Protectants, Misc. (MINERIN CREME) CREA Apply to affected areas 2 times daily as needed. 454 g 0   Zinc  Oxide (BALMEX EX) Apply 1 application  topically See admin instructions. Apply topically daily to affected sites     No current facility-administered  medications for this visit.   Facility-Administered Medications Ordered in Other Visits  Medication Dose Route Frequency Provider Last Rate Last Admin   sodium chloride  flush (NS) 0.9 % injection 10 mL  10 mL Intravenous PRN Lonn Hicks, MD        HISTORY OF PRESENT ILLNESS:   Oncology History  Multiple myeloma in relapse (HCC)  10/14/2010 Initial Diagnosis   Multiple myeloma in relapse (HCC)   01/09/2020 - 12/30/2021 Chemotherapy   Patient is on Treatment Plan : MYELOMA RELAPSED / REFRACTORY Daratumumab  + Bortezomib  + Dexamethasone  (DaraVd) q21d / Daratumumab  q28d     01/06/2022 - 03/24/2022 Chemotherapy   Patient is on Treatment Plan : MYELOMA RELAPSED/REFRACTORY Carfilzomib  + Dexamethasone  (Kd) weekly q28d     01/06/2022 -  Chemotherapy   Patient is on Treatment Plan : MYELOMA RELAPSED/REFRACTORY Carfilzomib  (20/70) D1,8,15 + Dexamethasone  weekly (40) (Kd) q28d  x 9 cycles / Dexamethasone  D1,8,15  REVIEW OF SYSTEMS:   Constitutional: Denies fevers, chills or abnormal weight loss Eyes: Denies blurriness of vision Ears, nose, mouth, throat, and face: Denies mucositis or sore throat Respiratory: Denies cough, dyspnea or wheezes Cardiovascular: Denies palpitation, chest discomfort or lower extremity swelling Gastrointestinal:  Denies nausea, heartburn or change in bowel habits Skin: Denies abnormal skin rashes Lymphatics: Denies new lymphadenopathy or easy bruising Neurological:Denies numbness, tingling or new weaknesses Behavioral/Psych: Mood is stable, no new changes  All other systems were reviewed with the patient and are negative.   VITALS:   Today's Vitals   08/02/23 1407 08/02/23 1408  BP: 133/76   Pulse: 83   Resp: 15   Temp: 98.9 F (37.2 C)   TempSrc: Temporal   SpO2: 100%   Weight: 243 lb 9.6 oz (110.5 kg)   PainSc:  0-No pain   Body mass index is 33.98 kg/m.   Wt Readings from Last 3 Encounters:  08/02/23 243 lb 9.6 oz (110.5 kg)  07/20/23 240 lb  12.8 oz (109.2 kg)  07/06/23 243 lb 12.8 oz (110.6 kg)    Body mass index is 33.98 kg/m.  Performance status (ECOG): 1 - Symptomatic but completely ambulatory  PHYSICAL EXAM:   GENERAL:alert, no distress and comfortable SKIN: skin color, texture, turgor are normal, no rashes or significant lesions EYES: normal, Conjunctiva are pink and non-injected, sclera clear OROPHARYNX:no exudate, no erythema and lips, buccal mucosa, and tongue normal  NECK: supple, thyroid normal size, non-tender, without nodularity LYMPH:  no palpable lymphadenopathy in the cervical, axillary or inguinal LUNGS: clear to auscultation and percussion with normal breathing effort HEART: regular rate & rhythm and no murmurs and no lower extremity edema ABDOMEN:abdomen soft, non-tender and normal bowel sounds Musculoskeletal:no cyanosis of digits and no clubbing. He is currently in manual wheelchair.  NEURO: alert & oriented x 3 with fluent speech, no focal motor/sensory deficits  LABORATORY DATA:  I have reviewed the data as listed    Component Value Date/Time   NA 145 08/02/2023 1316   NA 141 03/01/2017 1133   K 4.0 08/02/2023 1316   K 4.0 03/01/2017 1133   CL 112 (H) 08/02/2023 1316   CL 104 11/15/2012 0823   CO2 27 08/02/2023 1316   CO2 26 03/01/2017 1133   GLUCOSE 91 08/02/2023 1316   GLUCOSE 89 03/01/2017 1133   GLUCOSE 141 (H) 11/15/2012 0823   BUN 11 08/02/2023 1316   BUN 13.7 03/01/2017 1133   CREATININE 0.75 08/02/2023 1316   CREATININE 0.8 03/01/2017 1133   CALCIUM  9.6 08/02/2023 1316   CALCIUM  10.1 03/01/2017 1133   PROT 6.6 08/02/2023 1316   PROT 7.7 03/01/2017 1133   PROT 8.1 03/01/2017 1133   ALBUMIN  4.0 08/02/2023 1316   ALBUMIN  3.7 03/01/2017 1133   AST 13 (L) 08/02/2023 1316   AST 28 03/01/2017 1133   ALT 17 08/02/2023 1316   ALT 35 03/01/2017 1133   ALKPHOS 100 08/02/2023 1316   ALKPHOS 91 03/01/2017 1133   BILITOT 0.7 08/02/2023 1316   BILITOT 0.60 03/01/2017 1133   GFRNONAA  >60 08/02/2023 1316   GFRAA >60 04/23/2020 1006    Lab Results  Component Value Date   WBC 7.9 08/02/2023   NEUTROABS 5.7 08/02/2023   HGB 11.1 (L) 08/02/2023   HCT 32.5 (L) 08/02/2023   MCV 82.9 08/02/2023   PLT 350 08/02/2023

## 2023-08-02 NOTE — Patient Instructions (Signed)
 [facility] - A DEPT OF [facility]   Discharge Instructions: Thank you for choosing [facility] to provide your oncology and hematology care.   If you have a lab appointment with the Cancer Center, please go directly to the Cancer Center and check in at the registration area.   Wear comfortable clothing and clothing appropriate for easy access to any Portacath or PICC line.   We strive to give you quality time with your provider. You may need to reschedule your appointment if you arrive late (15 or more minutes).  Arriving late affects you and other patients whose appointments are after yours.  Also, if you miss three or more appointments without notifying the office, you may be dismissed from the clinic at the provider's discretion.      For prescription refill requests, have your pharmacy contact our office and allow 72 hours for refills to be completed.    Today you received the following chemotherapy and/or immunotherapy agents: Carfilzomib (Kyprolis)      To help prevent nausea and vomiting after your treatment, we encourage you to take your nausea medication as directed.  BELOW ARE SYMPTOMS THAT SHOULD BE REPORTED IMMEDIATELY: *FEVER GREATER THAN 100.4 F (38 C) OR HIGHER *CHILLS OR SWEATING *NAUSEA AND VOMITING THAT IS NOT CONTROLLED WITH YOUR NAUSEA MEDICATION *UNUSUAL SHORTNESS OF BREATH *UNUSUAL BRUISING OR BLEEDING *URINARY PROBLEMS (pain or burning when urinating, or frequent urination) *BOWEL PROBLEMS (unusual diarrhea, constipation, pain near the anus) TENDERNESS IN MOUTH AND THROAT WITH OR WITHOUT PRESENCE OF ULCERS (sore throat, sores in mouth, or a toothache) UNUSUAL RASH, SWELLING OR PAIN  UNUSUAL VAGINAL DISCHARGE OR ITCHING   Items with * indicate a potential emergency and should be followed up as soon as possible or go to the Emergency Department if any problems should occur.  Please show the CHEMOTHERAPY ALERT CARD or  IMMUNOTHERAPY ALERT CARD at check-in to the Emergency Department and triage nurse.  Should you have questions after your visit or need to cancel or reschedule your appointment, please contact [facility]  Dept: [phone]  and follow the prompts.  Office hours are 8:00 a.m. to 4:30 p.m. Monday - Friday. Please note that voicemails left after 4:00 p.m. may not be returned until the following business day.  We are closed weekends and major holidays. You have access to a nurse at all times for urgent questions. Please call the main number to the clinic Dept: [phone] and follow the prompts.   For any non-urgent questions, you may also contact your provider using MyChart. We now offer e-Visits for anyone 55 and older to request care online for non-urgent symptoms. For details visit [email or web address].   Also download the MyChart app! Go to the app store, search "MyChart", open the app, select [place], and log in with your MyChart username and password.

## 2023-08-05 ENCOUNTER — Encounter: Payer: Self-pay | Admitting: Hematology and Oncology

## 2023-08-05 ENCOUNTER — Encounter: Payer: Self-pay | Admitting: Nurse Practitioner

## 2023-08-06 LAB — KAPPA/LAMBDA LIGHT CHAINS
Kappa free light chain: 7.2 mg/L (ref 3.3–19.4)
Kappa, lambda light chain ratio: 1.31 (ref 0.26–1.65)
Lambda free light chains: 5.5 mg/L — ABNORMAL LOW (ref 5.7–26.3)

## 2023-08-10 ENCOUNTER — Other Ambulatory Visit: Payer: Self-pay

## 2023-08-10 ENCOUNTER — Inpatient Hospital Stay: Payer: Medicare (Managed Care)

## 2023-08-10 VITALS — BP 121/73 | HR 68 | Temp 97.9°F | Resp 16 | Wt 243.5 lb

## 2023-08-10 DIAGNOSIS — Z5111 Encounter for antineoplastic chemotherapy: Secondary | ICD-10-CM | POA: Diagnosis not present

## 2023-08-10 DIAGNOSIS — C9002 Multiple myeloma in relapse: Secondary | ICD-10-CM

## 2023-08-10 LAB — CBC WITH DIFFERENTIAL (CANCER CENTER ONLY)
Abs Immature Granulocytes: 0.05 10*3/uL (ref 0.00–0.07)
Basophils Absolute: 0 10*3/uL (ref 0.0–0.1)
Basophils Relative: 0 %
Eosinophils Absolute: 0.3 10*3/uL (ref 0.0–0.5)
Eosinophils Relative: 3 %
HCT: 34.8 % — ABNORMAL LOW (ref 39.0–52.0)
Hemoglobin: 11.7 g/dL — ABNORMAL LOW (ref 13.0–17.0)
Immature Granulocytes: 1 %
Lymphocytes Relative: 11 %
Lymphs Abs: 0.8 10*3/uL (ref 0.7–4.0)
MCH: 28.5 pg (ref 26.0–34.0)
MCHC: 33.6 g/dL (ref 30.0–36.0)
MCV: 84.7 fL (ref 80.0–100.0)
Monocytes Absolute: 0.8 10*3/uL (ref 0.1–1.0)
Monocytes Relative: 10 %
Neutro Abs: 5.7 10*3/uL (ref 1.7–7.7)
Neutrophils Relative %: 75 %
Platelet Count: 203 10*3/uL (ref 150–400)
RBC: 4.11 MIL/uL — ABNORMAL LOW (ref 4.22–5.81)
RDW: 18.4 % — ABNORMAL HIGH (ref 11.5–15.5)
WBC Count: 7.6 10*3/uL (ref 4.0–10.5)
nRBC: 0 % (ref 0.0–0.2)

## 2023-08-10 LAB — CMP (CANCER CENTER ONLY)
ALT: 13 U/L (ref 0–44)
AST: 11 U/L — ABNORMAL LOW (ref 15–41)
Albumin: 3.9 g/dL (ref 3.5–5.0)
Alkaline Phosphatase: 93 U/L (ref 38–126)
Anion gap: 7 (ref 5–15)
BUN: 15 mg/dL (ref 8–23)
CO2: 26 mmol/L (ref 22–32)
Calcium: 9.9 mg/dL (ref 8.9–10.3)
Chloride: 109 mmol/L (ref 98–111)
Creatinine: 0.74 mg/dL (ref 0.61–1.24)
GFR, Estimated: >60 mL/min (ref >60)
Glucose, Bld: 114 mg/dL — ABNORMAL HIGH (ref 70–99)
Potassium: 3.7 mmol/L (ref 3.5–5.1)
Sodium: 142 mmol/L (ref 135–145)
Total Bilirubin: 0.8 mg/dL (ref 0.0–1.2)
Total Protein: 6.4 g/dL — ABNORMAL LOW (ref 6.5–8.1)

## 2023-08-10 MED ORDER — SODIUM CHLORIDE 0.9 % IV SOLN: Freq: Once | INTRAVENOUS | Status: AC

## 2023-08-10 MED ORDER — SODIUM CHLORIDE 0.9 % IV SOLN
Freq: Once | INTRAVENOUS | Status: AC
Start: 2023-08-10 — End: 2023-08-10

## 2023-08-10 MED ORDER — SODIUM CHLORIDE 0.9 % IV SOLN
40.0000 mg | Freq: Once | INTRAVENOUS | Status: AC
  Administered 2023-08-10: 40 mg via INTRAVENOUS
  Filled 2023-08-10: qty 4

## 2023-08-10 MED ORDER — DEXTROSE 5 % IV SOLN
70.0000 mg/m2 | Freq: Once | INTRAVENOUS | Status: AC
  Administered 2023-08-10: 150 mg via INTRAVENOUS
  Filled 2023-08-10: qty 60

## 2023-08-10 NOTE — Patient Instructions (Signed)
 [facility]  Discharge Instructions: Thank you for choosing [facility] to provide your oncology and hematology care.   If you have a lab appointment with the Cancer Center, please go directly to the Cancer Center and check in at the registration area.   Wear comfortable clothing and clothing appropriate for easy access to any Portacath or PICC line.   We strive to give you quality time with your provider. You may need to reschedule your appointment if you arrive late (15 or more minutes).  Arriving late affects you and other patients whose appointments are after yours.  Also, if you miss three or more appointments without notifying the office, you may be dismissed from the clinic at the provider's discretion.      For prescription refill requests, have your pharmacy contact our office and allow 72 hours for refills to be completed.    Today you received the following chemotherapy and/or immunotherapy agents: Kyprolis      To help prevent nausea and vomiting after your treatment, we encourage you to take your nausea medication as directed.  BELOW ARE SYMPTOMS THAT SHOULD BE REPORTED IMMEDIATELY: *FEVER GREATER THAN 100.4 F (38 C) OR HIGHER *CHILLS OR SWEATING *NAUSEA AND VOMITING THAT IS NOT CONTROLLED WITH YOUR NAUSEA MEDICATION *UNUSUAL SHORTNESS OF BREATH *UNUSUAL BRUISING OR BLEEDING *URINARY PROBLEMS (pain or burning when urinating, or frequent urination) *BOWEL PROBLEMS (unusual diarrhea, constipation, pain near the anus) TENDERNESS IN MOUTH AND THROAT WITH OR WITHOUT PRESENCE OF ULCERS (sore throat, sores in mouth, or a toothache) UNUSUAL RASH, SWELLING OR PAIN  UNUSUAL VAGINAL DISCHARGE OR ITCHING   Items with * indicate a potential emergency and should be followed up as soon as possible or go to the Emergency Department if any problems should occur.  Please show the CHEMOTHERAPY ALERT CARD or IMMUNOTHERAPY  ALERT CARD at check-in to the Emergency Department and triage nurse.  Should you have questions after your visit or need to cancel or reschedule your appointment, please contact [facility]  Dept: [phone]  and follow the prompts.  Office hours are 8:00 a.m. to 4:30 p.m. Monday - Friday. Please note that voicemails left after 4:00 p.m. may not be returned until the following business day.  We are closed weekends and major holidays. You have access to a nurse at all times for urgent questions. Please call the main number to the clinic Dept: [phone] and follow the prompts.   For any non-urgent questions, you may also contact your provider using MyChart. We now offer e-Visits for anyone 73 and older to request care online for non-urgent symptoms. For details visit [email or web address].   Also download the MyChart app! Go to the app store, search "MyChart", open the app, select [place], and log in with your MyChart username and password.

## 2023-08-12 ENCOUNTER — Other Ambulatory Visit: Payer: Self-pay

## 2023-08-12 LAB — MULTIPLE MYELOMA PANEL, SERUM
Albumin SerPl Elph-Mcnc: 3.6 g/dL (ref 2.9–4.4)
Albumin/Glob SerPl: 1.4 (ref 0.7–1.7)
Alpha 1: 0.3 g/dL (ref 0.0–0.4)
Alpha2 Glob SerPl Elph-Mcnc: 0.8 g/dL (ref 0.4–1.0)
B-Globulin SerPl Elph-Mcnc: 1 g/dL (ref 0.7–1.3)
Gamma Glob SerPl Elph-Mcnc: 0.6 g/dL (ref 0.4–1.8)
Globulin, Total: 2.7 g/dL (ref 2.2–3.9)
IgA: 40 mg/dL — ABNORMAL LOW (ref 61–437)
IgG (Immunoglobin G), Serum: 640 mg/dL (ref 603–1613)
IgM (Immunoglobulin M), Srm: 21 mg/dL (ref 20–172)
Total Protein ELP: 6.3 g/dL (ref 6.0–8.5)

## 2023-08-16 NOTE — Progress Notes (Signed)
[name] [phone]   [phone]  PROGRESS NOTE  Patient Care Team: Jethro Bastos, MD as PCP - General (Family Medicine) Ranelle Oyster, MD as Consulting Physician (Physical Medicine and Rehabilitation) Hildred Alamin Mcarthur Rossetti, PA-C as Physician Assistant (Physical Medicine and Rehabilitation) Jaci Standard, MD as Consulting Physician (Hematology and Oncology) Jaci Standard, MD as Consulting Physician (Hematology and Oncology) Jaci Standard, MD as Consulting Physician (Hematology and Oncology)  Hematological/Oncological History # IgG Lambda Multiple Myeloma, Relapsed (ISS Stage II) 1) 06/2010: initial diagnosis of Multiple Myeloma after T8 compression fracture. Treated with Velcade/Revlimid/Dexamethasone and achieved a complete remission 2) Velcade was discontinued in September 2012 and that Revlimid and Decadron were discontinued in March 2013. 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 4) Followed by Dr. Bertis Ruddy, last clinic visit 10/09/2019. At that time there was concern for relapse of his multiple myeloma.  5) Patient requested transfer to different provider after misunderstanding regarding imaging studies 6) 12/17/2019: transfer care to Dr. Leonides Schanz  7) 01/09/2020: Cycle 1 Day 1 of Dara/Velcade/Dex 8) 01/21/2020: presented as urgent visit for diarrhea and dehydration. Holding chemotherapy scheduled for 01/23/2020. 9) 01/30/2020: Resume dara/velcade/dex after resolution of diarrhea.  10) 02/13/2020: restaging labs show M protein 0.8, Kappa 4.5, lamba 17.2, ratio 0.26, urine M protein 53 (7.1%). All MM labs indicate improvement.  11) 03/10/2020: Cycle 4 Day 1 of Dara/Velcade/Dex. Transition to q 3 week daratumumab.  12) 06/04/2020:  Cycle 8 Day 1 of Dara/Velcade/Dex 13) 06/25/2020:  Cycle 9 Day 1 of Dara/Velcade/Dex 14) 07/22/2020: Cycle 10 Day 1  of  Dara//Dex 15) 08/18/2020: Cycle 11 Day 1 of Dara//Dex 16) 09/23/2020: Cycle 12 Day 1 of Dara//Dex 17) 10/22/2020: Cycle 13 Day 1 of Dara/Dex  18) 11/19/2020: Cycle 14 Day 1 of Dara/Dex  19) 12/17/2020: Cycle 15 Day 1 of Dara/Dex  20) 01/17/2021: Cycle 16 Day 1 of Dara/Dex  21) 02/11/2021: Cycle 17 Day 1 of Dara/Dex 22) 03/10/2021: Cycle 18 Day 1 of Dara/Dex  23) 04/08/2021: Cycle 19 Day 1 of Dara/Dex  24) 08/03/2021: Cycle 20 Day 1 of Dara/Dex (delayed due to scheduling error) 25) 09/02/2021: Cycle 21 Day 1 of Dara/Dex 26) 09/30/2021: Cycle 22 Day 1 of Dara/Dex 27) 10/28/2021: Cycle 23 Day 1 of Dara/Dex 28) 12/02/2021: Cycle 24 Day 1 of Dara/Dex 29) 12/30/2021: Cycle 25 Day 1 of Dara/Dex 30) 01/06/2022: Cycle 1 Day 1 of Kyprolis/Dex 31) 02/03/2022: Cycle 2 Day 1 of Kyprolis/Dex 32) 02/24/2022: Cycle 3 Day 1 of Kyprolis/Dex 33) 04/07/2022: Cycle 4 Day 1 of Kyprolis/Dex 34) 05/05/2022: Cycle 5 Day 1 of Kyprolis/Dex 35) 06/01/2022: Cycle 6 Day 1 of Kyprolis/Dex 36) 06/29/2022: Cycle 7 Day 1 of Kyprolis/Dex 37) 07/28/2022: Cycle 8 Day 1 of Kyprolis/Dex 38) 08/25/2022: Cycle 9 Day 1 of Kyprolis/Dex 39) 09/22/2022: Cycle 10 Day 1 of Kyprolis/Dex 40) 10/20/2022: Cycle 11 Day 1 of Kyprolis/Dex 41) 11/17/2022: Cycle 12 Day 1 of Kyprolis/Dex 42 ) 12/22/2022-01/08/2023: Admitted for fournier's gangrene of scrotum.  43) 01/26/2023: Cycle 13 Day 1 of Kyprolis/Dex 44) 02/16/2023: Cycle 14 Day 1 of Kyprolis/Dex 45) 03/16/2023:Cycle 15 Day 1 of Kyprolis/Dex 46) 04/13/2023: Cycle 16 Day 1 of Kyprolis/Dex 47) 05/11/2023: Cycle 17 Day 1 of Kyprolis/Dex 48) 06/07/2023: Cycle 18 Day 1 of Kyprolis/Dex 49) 07/06/2023: Cycle 19 Day 1 of Kyprolis/Dex 50) 08/02/2023: Cycle 20 Day 1 of Kyprolis/Dex  Interval History:  [name]  [name] 68 y.o. male with medical history significant for  IgG Lambda Multiple Myeloma who presents for a follow up visit. The patient's last visit was on 08/02/2023.  He presents today prior to treatment with  Kyprolis/Dex. He is unaccompanied for this visit.   On exam today [name] reports he is doing well without any new or concerning symptoms.  His energy levels and appetite are stable.  He denies any nausea, or vomiting episodes.  He is denying any pain at this time.  He continues to exercise and stay active as tolerated. Overall he is willing and able to proceed with Kyprolis therapy at this time.  He denies fevers, chills, night sweats, shortness of breath, chest pain or cough.  He has no other complaints.  Rest of the 10 point ROS is below.  MEDICAL HISTORY:  Past Medical History:  Diagnosis Date   Adrenal insufficiency (HCC)    on chronic dexamethasone   Anemia    Cancer (HCC)    Coagulopathy (HCC)    on xeralto/ s/p DVT while on coumadin,  IVC in place   Diabetes mellitus without complication (HCC)    type 2   Glaucoma 12/22/2022   Gross hematuria 01/2013   post foley cath procedure   History of blood transfusion 01/2013   Lower GI bleeding 04/18/2017   Multiple myeloma    thoracic T8 with paraplegia s/p resection- on chemo at visit 10/13/10   Multiple myeloma    Multiple myeloma without mention of remission    Neurogenic bladder    Neurogenic bowel    OSA (obstructive sleep apnea) 11/01/2022   Paraplegia (HCC)    Partial small bowel obstruction (HCC) during dec 2011 admission    SURGICAL HISTORY: Past Surgical History:  Procedure Laterality Date   COLONOSCOPY WITH PROPOFOL N/A 04/12/2017   Procedure: COLONOSCOPY WITH PROPOFOL;  Surgeon: Hilarie Fredrickson, MD;  Location: [facility];  Service: Endoscopy;  Laterality: N/A;   COLONOSCOPY WITH PROPOFOL N/A 04/19/2017   Procedure: COLONOSCOPY WITH PROPOFOL;  Surgeon: Benancio Deeds, MD;  Location: [facility];  Service: Gastroenterology;  Laterality: N/A;   COLOSTOMY  07/20/2011   Procedure: COLOSTOMY;  Surgeon: Rulon Abide, DO;  Location: [facility];  Service: General;;   COLOSTOMY REVISION  07/20/2011   Procedure: COLON  RESECTION SIGMOID;  Surgeon: Rulon Abide, DO;  Location: [facility] OR;  Service: General;;   CYSTOSCOPY N/A 04/04/2013   Procedure: CYSTOSCOPY WITH LITHALOPAXY;  Surgeon: Sebastian Ache, MD;  Location: [facility];  Service: Urology;  Laterality: N/A;   INCISION AND DRAINAGE ABSCESS N/A 12/25/2022   Procedure: INCISION AND DRAINAGE OF SCROTUM;  Surgeon: Despina Arias, MD;  Location: [facility];  Service: Urology;  Laterality: N/A;   INSERTION OF SUPRAPUBIC CATHETER N/A 04/04/2013   Procedure: INSERTION OF SUPRAPUBIC CATHETER;  Surgeon: Sebastian Ache, MD;  Location: [facility];  Service: Urology;  Laterality: N/A;   LAPAROTOMY  07/20/2011   Procedure: EXPLORATORY LAPAROTOMY;  Surgeon: Rulon Abide, DO;  Location: [facility] OR;  Service: General;  Laterality: N/A;   myeloma thoracic T8 with parpaplegia s/p thoracotomy and thoracic T7-9 cage placement on Dec 26th 2011  07/18/10   SCROTAL EXPLORATION N/A 12/29/2022   Procedure: SCROTAL WOUND DEBRIDEMENT AND CLOSURE;  Surgeon: Loletta Parish., MD;  Location: [facility];  Service: Urology;  Laterality: N/A;    SOCIAL HISTORY: Social History   Socioeconomic History   Marital status: Married    Spouse name: Not on file  Number of children: Not on file   Years of education: Not on file   Highest education level: Not on file  Occupational History   Not on file  Tobacco Use   Smoking status: Never   Smokeless tobacco: Never  Vaping Use   Vaping status: Never Used  Substance and Sexual Activity   Alcohol use: No   Drug use: No   Sexual activity: Never  Other Topics Concern   Not on file  Social History Narrative   Not on file   Social Drivers of Health   Financial Resource Strain: Not on file  Food Insecurity: No Food Insecurity (12/22/2022)   Hunger Vital Sign    Worried About Running Out of Food in the Last Year: Never true    Ran Out of Food in the Last Year: Never true  Transportation Needs: No Transportation Needs (12/22/2022)   PRAPARE -  Administrator, Civil Service (Medical): No    Lack of Transportation (Non-Medical): No  Physical Activity: Not on file  Stress: Not on file  Social Connections: Not on file  Intimate Partner Violence: Not At Risk (12/22/2022)   Humiliation, Afraid, Rape, and Kick questionnaire    Fear of Current or Ex-Partner: No    Emotionally Abused: No    Physically Abused: No    Sexually Abused: No    FAMILY HISTORY: Family History  Problem Relation Age of Onset   Ovarian cancer Mother    Diabetes Father     ALLERGIES:  is allergic to ferumoxytol.  MEDICATIONS:  Current Outpatient Medications  Medication Sig Dispense Refill   acetaminophen (TYLENOL) 325 MG tablet Take 325-650 mg by mouth every 6 (six) hours as needed for headache or mild pain.     acetic acid 0.25 % irrigation Irrigate with 1 Application as directed See admin instructions. Instill 50 ml's, clamp for 10 minutes, then remove clamp and drain the bladder. Repeat 3 times a week- when not using the Renacidin irrigation     acyclovir (ZOVIRAX) 400 MG tablet Take 1 tablet (400 mg total) by mouth 2 (two) times daily. 60 tablet 2   albuterol (VENTOLIN HFA) 108 (90 Base) MCG/ACT inhaler Inhale 2 puffs into the lungs every 4 (four) hours as needed for wheezing or shortness of breath.     atorvastatin (LIPITOR) 20 MG tablet Take 20 mg by mouth daily.     BENADRYL ALLERGY 25 MG tablet Take 25 mg by mouth every 6 (six) hours as needed (for allergic reactions).     BIOFREEZE 4 % GEL Apply 1 application  topically every 4 (four) hours as needed (for joint pain- to intact areas of the skin only).     Citric Ac-Gluconolact-Mg Carb (RENACIDIN IR) Irrigate with 30 mLs as directed See admin instructions. Instill 30 ml's, clamp for 10 minutes, then remove clamp and drain the bladder. Repeat 3 times a week- when not using the acetic acid irrigation     COSOPT PF 2-0.5 % SOLN Place 1 drop into both eyes 2 (two) times daily.     diazepam  (VALIUM) 5 MG tablet Take 5 mg by mouth 2 (two) times daily as needed for muscle spasms (after spinal cord injury).     Emollient (EUCERIN EX) Apply 1 application  topically See admin instructions. Apply to affected area 2 times a day     FLOVENT HFA 110 MCG/ACT inhaler 2 each See admin instructions. 2 sprays to colostomy stoma prior to each colostomy bag  change     iron polysaccharides (NU-IRON) 150 MG capsule Take 1 capsule (150 mg total) by mouth daily. 30 capsule 0   latanoprost (XALATAN) 0.005 % ophthalmic solution Place 1 drop into both eyes at bedtime.     Multiple Vitamin (MULTIVITAMIN WITH MINERALS) TABS tablet Take 1 tablet by mouth daily. 30 tablet 0   NON FORMULARY Apply 1 application  topically See admin instructions. Colo Plast paste- Apply as directed with each ostomy bag change     ondansetron (ZOFRAN) 4 MG tablet Take 1 tablet (4 mg) by mouth every 6 hours as needed for nausea. 20 tablet 0   rivaroxaban (XARELTO) 10 MG TABS tablet Take 1 tablet (10 mg total) by mouth daily with supper. (Patient taking differently: Take 10 mg by mouth daily.) 30 tablet 9   Skin Protectants, Misc. (MINERIN CREME) CREA Apply to affected areas 2 times daily as needed. 454 g 0   Zinc Oxide (BALMEX EX) Apply 1 application  topically See admin instructions. Apply topically daily to affected sites     No current facility-administered medications for this visit.   Facility-Administered Medications Ordered in Other Visits  Medication Dose Route Frequency Provider Last Rate Last Admin   sodium chloride flush (NS) 0.9 % injection 10 mL  10 mL Intravenous PRN Bertis Ruddy, Ni, MD        REVIEW OF SYSTEMS:   Constitutional: ( - ) fevers, ( - )  chills , ( - ) night sweats Eyes: ( - ) blurriness of vision, ( - ) double vision, ( - ) watery eyes Ears, nose, mouth, throat, and face: ( - ) mucositis, ( - ) sore throat Respiratory: ( - ) cough, ( - ) dyspnea, ( - ) wheezes Cardiovascular: ( - ) palpitation, ( - )  chest discomfort, ( - ) lower extremity swelling Gastrointestinal:  ( - ) nausea, ( - ) heartburn, ( - ) change in bowel habits Skin: ( - ) abnormal skin rashes Lymphatics: ( - ) new lymphadenopathy, ( - ) easy bruising Neurological: ( - ) numbness, ( - ) tingling, ( - ) new weaknesses Behavioral/Psych: ( - ) mood change, ( - ) new changes  All other systems were reviewed with the patient and are negative.  PHYSICAL EXAMINATION: ECOG PERFORMANCE STATUS: paraplegic.   Vitals:   08/17/23 0832  BP: 125/83  Pulse: 79  Resp: 18  Temp: (!) 97.3 F (36.3 C)  SpO2: 100%      Filed Weights   08/17/23 0832  Weight: 247 lb 9.6 oz (112.3 kg)    GENERAL: well appearing middle aged [place] American male alert, no distress and comfortable SKIN: skin color, texture, turgor are normal, no rashes or significant lesions EYES: conjunctiva are pink and non-injected, sclera clear LUNGS: clear to auscultation and percussion with normal breathing effort HEART: regular rate & rhythm and no murmurs Musculoskeletal: no cyanosis of digits and no clubbing  PSYCH: alert & oriented x 3, fluent speech NEURO: paraplegic, no use of LE bilaterally.   LABORATORY DATA:  I have reviewed the data as listed    Latest Ref Rng & Units 08/17/2023    8:10 AM 08/10/2023    8:36 AM 08/02/2023    1:16 PM  CBC  WBC 4.0 - 10.5 K/uL 8.4  7.6  7.9   Hemoglobin 13.0 - 17.0 g/dL 16.1  09.6  04.5   Hematocrit 39.0 - 52.0 % 33.0  34.8  32.5   Platelets 150 - 400  K/uL 174  203  350        Latest Ref Rng & Units 08/10/2023    8:36 AM 08/02/2023    1:16 PM 07/20/2023    9:56 AM  CMP  Glucose 70 - 99 mg/dL 161  91  096   BUN 8 - 23 mg/dL 15  11  14    Creatinine 0.61 - 1.24 mg/dL 0.45  4.09  8.11   Sodium 135 - 145 mmol/L 142  145  143   Potassium 3.5 - 5.1 mmol/L 3.7  4.0  3.9   Chloride 98 - 111 mmol/L 109  112  111   CO2 22 - 32 mmol/L 26  27  26    Calcium 8.9 - 10.3 mg/dL 9.9  9.6  9.5   Total Protein 6.5 - 8.1  g/dL 6.4  6.6  6.4   Total Bilirubin 0.0 - 1.2 mg/dL 0.8  0.7  0.6   Alkaline Phos 38 - 126 U/L 93  100  89   AST 15 - 41 U/L 11  13  12    ALT 0 - 44 U/L 13  17  20      Lab Results  Component Value Date   MPROTEIN Not Observed 08/02/2023   MPROTEIN 0.2 (H) 07/06/2023   MPROTEIN 0.2 (H) 06/07/2023   Lab Results  Component Value Date   KPAFRELGTCHN 7.2 08/02/2023   KPAFRELGTCHN 16.0 07/06/2023   KPAFRELGTCHN 11.1 06/07/2023   LAMBDASER 5.5 (L) 08/02/2023   LAMBDASER 25.5 07/06/2023   LAMBDASER 6.6 06/07/2023   KAPLAMBRATIO 1.31 08/02/2023   KAPLAMBRATIO 0.63 07/06/2023   KAPLAMBRATIO 1.68 (H) 06/07/2023    RADIOGRAPHIC STUDIES: No results found.  ASSESSMENT & PLAN [name] 68 y.o. male with medical history significant for  IgG Lambda Multiple Myeloma who presents for a follow up visit.  # IgG Lambda Multiple Myeloma, Relapsed (ISS Stage II) --findings are most consistent with relapsed multiple myeloma. Patient previously successfully treated with Velcade/Rev/Dex and Daratumumab/Velcade/Dex. On 07/02/2020 he transitioned to monthly daratumumab alone.  --due to to rise in M protein, switched to Kyprolis and Dexamethasone on 01/06/2022 Plan: --Due for Cycle 20, Day 15 of Kyprolis/Dex today  --Labs show white blood cell count 8.4, hemoglobin 11.2, MCV 82.3, platelets 174. Creatinine and LFTs are in range.  --Most recent myeloma labs from 08/02/2023 showed M protein was not detectable with kappa 7.2, lambda 5.5, ratio 1.31.  --Proceed with treatment today without any dose modifications. --Continue with weekly labs plus Kyprolis/Dex treatment as scheduled  #Memory loss: --Encouraged to do memory exercises including puzzles, suduko, etc --If symptoms worsen, we can make a referral to neurology for further evaluation.   #History of Sepsis 2/2 UTI and Fournier's gangrene of scrotum: --Hospitalized from 12/22/2022-01/08/2023. Underwent debridement of necrotic tissue. --Given  PIP tazobactam and Zyvox initially --Urine culture reveals Proteus mirabilis which is resistant to ciprofloxacin and nitrofurantoin --Blood cultures negative --D/C on remainder doxycycline and Augmentin which he completed on 01/07/2023.  --Evaluated by urology on 7/2 and ID on 7/3 with continued wound healing and no further d/c or evidence of infection.   #History of DVT --He had placement of IVC filter, remains on Xarelto. --Due to poor mobility, and lack of bleeding complications, recommend to continue on Xarelto indefinitely. --caution if Plt count were to drop <50  #Snoring/Risk for OSA: --Diagnosed with OSA and has CPAP machine.   # Supportive Care -- provided patient with an albuterol inhaler (for use with daratumumab) --acyclovir 400mg  BID for  VZV prophylaxis --zofran 8mg  q8H PRN for nausea/vomiting  --Zometa is being held in the setting of his prior episode of osteonecrosis of the jaw  No orders of the defined types were placed in this encounter.   All questions were answered. The patient knows to call the clinic with any problems, questions or concerns.  I have spent a total of 30 minutes minutes of face-to-face and non-face-to-face time, preparing to see the patient,  performing a medically appropriate examination, counseling and educating the patient, communicating with other health care professionals, documenting clinical information in the electronic health record,  and care coordination.   Georga Kaufmann PA-C Dept of Hematology and Oncology [facility] Phone: [phone]   08/17/2023 8:49 AM   Literature Support:  Milagros Loll, Petrucci MT, [place], [place], Grady, Malcolm, Spada S, [place], Ponticelli E, McMurray, Cavo M, Di Toritto TC, Haydee Salter F, Monte[place] V, Palumbo A, Boccadoro M, Larocca A. Once-weekly versus twice-weekly carfilzomib in patients with newly diagnosed multiple myeloma: a pooled analysis  of two phase I/II studies. Haematologica. 2019 Aug;104(8):1640-1647.  --Once-weekly 70 mg/m2 carfilzomib as induction and maintenance therapy for newly diagnosed multiple myeloma patients was as safe and effective as twice-weekly 36 mg/m2 carfilzomib and provided a more convenient schedule.

## 2023-08-17 ENCOUNTER — Inpatient Hospital Stay: Payer: Medicare (Managed Care)

## 2023-08-17 ENCOUNTER — Other Ambulatory Visit: Payer: Self-pay

## 2023-08-17 ENCOUNTER — Inpatient Hospital Stay (HOSPITAL_BASED_OUTPATIENT_CLINIC_OR_DEPARTMENT_OTHER): Payer: Medicare (Managed Care) | Admitting: Physician Assistant

## 2023-08-17 VITALS — BP 125/83 | HR 79 | Temp 97.3°F | Resp 18 | Wt 247.6 lb

## 2023-08-17 DIAGNOSIS — C9002 Multiple myeloma in relapse: Secondary | ICD-10-CM

## 2023-08-17 DIAGNOSIS — Z5111 Encounter for antineoplastic chemotherapy: Secondary | ICD-10-CM | POA: Diagnosis not present

## 2023-08-17 LAB — CBC WITH DIFFERENTIAL (CANCER CENTER ONLY)
Abs Immature Granulocytes: 0.04 10*3/uL (ref 0.00–0.07)
Basophils Absolute: 0 10*3/uL (ref 0.0–0.1)
Basophils Relative: 1 %
Eosinophils Absolute: 0.2 10*3/uL (ref 0.0–0.5)
Eosinophils Relative: 3 %
HCT: 33 % — ABNORMAL LOW (ref 39.0–52.0)
Hemoglobin: 11.2 g/dL — ABNORMAL LOW (ref 13.0–17.0)
Immature Granulocytes: 1 %
Lymphocytes Relative: 11 %
Lymphs Abs: 0.9 10*3/uL (ref 0.7–4.0)
MCH: 27.9 pg (ref 26.0–34.0)
MCHC: 33.9 g/dL (ref 30.0–36.0)
MCV: 82.3 fL (ref 80.0–100.0)
Monocytes Absolute: 0.8 10*3/uL (ref 0.1–1.0)
Monocytes Relative: 10 %
Neutro Abs: 6.4 10*3/uL (ref 1.7–7.7)
Neutrophils Relative %: 74 %
Platelet Count: 174 10*3/uL (ref 150–400)
RBC: 4.01 MIL/uL — ABNORMAL LOW (ref 4.22–5.81)
RDW: 18.4 % — ABNORMAL HIGH (ref 11.5–15.5)
WBC Count: 8.4 10*3/uL (ref 4.0–10.5)
nRBC: 0 % (ref 0.0–0.2)

## 2023-08-17 LAB — CMP (CANCER CENTER ONLY)
ALT: 15 U/L (ref 0–44)
AST: 10 U/L — ABNORMAL LOW (ref 15–41)
Albumin: 3.8 g/dL (ref 3.5–5.0)
Alkaline Phosphatase: 88 U/L (ref 38–126)
Anion gap: 7 (ref 5–15)
BUN: 13 mg/dL (ref 8–23)
CO2: 26 mmol/L (ref 22–32)
Calcium: 9.2 mg/dL (ref 8.9–10.3)
Chloride: 112 mmol/L — ABNORMAL HIGH (ref 98–111)
Creatinine: 0.75 mg/dL (ref 0.61–1.24)
GFR, Estimated: >60 mL/min (ref >60)
Glucose, Bld: 91 mg/dL (ref 70–99)
Potassium: 3.6 mmol/L (ref 3.5–5.1)
Sodium: 145 mmol/L (ref 135–145)
Total Bilirubin: 0.8 mg/dL (ref 0.0–1.2)
Total Protein: 6 g/dL — ABNORMAL LOW (ref 6.5–8.1)

## 2023-08-17 MED ORDER — SODIUM CHLORIDE 0.9 % IV SOLN: Freq: Once | INTRAVENOUS | Status: AC

## 2023-08-17 MED ORDER — SODIUM CHLORIDE 0.9 % IV SOLN: Freq: Once | INTRAVENOUS | Status: DC

## 2023-08-17 MED ORDER — DEXTROSE 5 % IV SOLN
70.0000 mg/m2 | Freq: Once | INTRAVENOUS | Status: AC
  Administered 2023-08-17: 150 mg via INTRAVENOUS
  Filled 2023-08-17: qty 60

## 2023-08-17 MED ORDER — SODIUM CHLORIDE 0.9 % IV SOLN
40.0000 mg | Freq: Once | INTRAVENOUS | Status: AC
  Administered 2023-08-17: 40 mg via INTRAVENOUS
  Filled 2023-08-17: qty 4

## 2023-08-17 NOTE — Patient Instructions (Signed)
[facility] - A DEPT OF [facility]  Discharge Instructions: Thank you for choosing [facility] to provide your oncology and hematology care.   If you have a lab appointment with the Cancer Center, please go directly to the Cancer Center and check in at the registration area.   Wear comfortable clothing and clothing appropriate for easy access to any Portacath or PICC line.   We strive to give you quality time with your provider. You may need to reschedule your appointment if you arrive late (15 or more minutes).  Arriving late affects you and other patients whose appointments are after yours.  Also, if you miss three or more appointments without notifying the office, you may be dismissed from the clinic at the provider's discretion.      For prescription refill requests, have your pharmacy contact our office and allow 72 hours for refills to be completed.    Today you received the following chemotherapy and/or immunotherapy agents: Kyprolis.       To help prevent nausea and vomiting after your treatment, we encourage you to take your nausea medication as directed.  BELOW ARE SYMPTOMS THAT SHOULD BE REPORTED IMMEDIATELY: *FEVER GREATER THAN 100.4 F (38 C) OR HIGHER *CHILLS OR SWEATING *NAUSEA AND VOMITING THAT IS NOT CONTROLLED WITH YOUR NAUSEA MEDICATION *UNUSUAL SHORTNESS OF BREATH *UNUSUAL BRUISING OR BLEEDING *URINARY PROBLEMS (pain or burning when urinating, or frequent urination) *BOWEL PROBLEMS (unusual diarrhea, constipation, pain near the anus) TENDERNESS IN MOUTH AND THROAT WITH OR WITHOUT PRESENCE OF ULCERS (sore throat, sores in mouth, or a toothache) UNUSUAL RASH, SWELLING OR PAIN  UNUSUAL VAGINAL DISCHARGE OR ITCHING   Items with * indicate a potential emergency and should be followed up as soon as possible or go to the Emergency Department if any problems should occur.  Please show the CHEMOTHERAPY ALERT CARD or IMMUNOTHERAPY  ALERT CARD at check-in to the Emergency Department and triage nurse.  Should you have questions after your visit or need to cancel or reschedule your appointment, please contact [facility] - A DEPT OF [facility]  Dept: [phone]  and follow the prompts.  Office hours are 8:00 a.m. to 4:30 p.m. Monday - Friday. Please note that voicemails left after 4:00 p.m. may not be returned until the following business day.  We are closed weekends and major holidays. You have access to a nurse at all times for urgent questions. Please call the main number to the clinic Dept: [phone] and follow the prompts.   For any non-urgent questions, you may also contact your provider using MyChart. We now offer e-Visits for anyone 45 and older to request care online for non-urgent symptoms. For details visit [email or web address].   Also download the MyChart app! Go to the app store, search "MyChart", open the app, select [place], and log in with your MyChart username and password.

## 2023-08-20 ENCOUNTER — Ambulatory Visit (INDEPENDENT_AMBULATORY_CARE_PROVIDER_SITE_OTHER): Payer: Medicare (Managed Care) | Admitting: Orthopedic Surgery

## 2023-08-20 ENCOUNTER — Other Ambulatory Visit: Payer: Self-pay

## 2023-08-20 ENCOUNTER — Encounter: Payer: Self-pay | Admitting: Orthopedic Surgery

## 2023-08-20 DIAGNOSIS — M25551 Pain in right hip: Secondary | ICD-10-CM

## 2023-08-20 DIAGNOSIS — M25552 Pain in left hip: Secondary | ICD-10-CM | POA: Diagnosis not present

## 2023-08-20 NOTE — Progress Notes (Signed)
Office Visit Note   Patient: [name]           Date of Birth: [birth year]           MRN: [id number] Visit Date: 08/20/2023              Requested by: Jethro Bastos, MD [address] 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  Patient is 1 year status post previous radiographic studies that were stable 1 year ago.  Assessment & Plan: Visit Diagnoses:  1. Bilateral hip pain     Plan: Patient's lesions are unchanged.  Will plan for following up as needed.  Follow-Up Instructions: Return if symptoms worsen or fail to improve.   Ortho Exam  Patient is alert, oriented, no adenopathy, well-dressed, normal affect, normal respiratory effort. Examination patient is asymptomatic.  Measurements of the lytic lesion shows the largest diameter of the right proximal femoral lesion 21 mm in diameter, 1 year ago this measured 22 mm.  The cortical thickness at the minimum is 7 mm laterally and 5 mm medially and this is unchanged.  The lytic lesion of the left proximal femur is also unchanged.  This measures 21 mm in diameter.  Minimal cortical erosion.  Imaging: XR HIP UNILAT W OR W/O PELVIS 1V LEFT Result Date: 08/20/2023 2 view radiographs of both hips shows stable lytic lesions of the femur that are unchanged.  No images are attached to the encounter.  Labs: Lab Results  Component Value Date   HGBA1C 5.4 12/23/2022   HGBA1C 6.0 (H) 07/22/2014   ESRSEDRATE 99 (H) 12/22/2022   ESRSEDRATE 73 (H) 10/21/2013   ESRSEDRATE 55 (H) 07/23/2013   CRP 7.0 (H) 12/27/2022   CRP 23.7 (H) 12/22/2022   CRP 1.0 (H) 07/14/2010   LABURIC 6.3 11/03/2022   LABURIC 6.4 10/27/2022   LABURIC 5.0 10/20/2022   REPTSTATUS 12/24/2022 FINAL 12/22/2022   CULT >=100,000 COLONIES/mL  PROTEUS MIRABILIS (A) 12/22/2022   LABORGA PROTEUS MIRABILIS (A) 12/22/2022     Lab Results  Component Value Date   ALBUMIN 3.8 08/17/2023   ALBUMIN 3.9 08/10/2023   ALBUMIN 4.0 08/02/2023    Lab Results  Component Value Date   MG 1.8 12/27/2022   MG 1.7 12/26/2022   MG 1.7 12/25/2022   Lab Results  Component Value Date   VD25OH 59 04/08/2013   VD25OH 58 01/16/2013   VD25OH 63 11/15/2012    No results found for: "PREALBUMIN"    Latest Ref Rng & Units 08/17/2023    8:10 AM 08/10/2023    8:36 AM 08/02/2023    1:16 PM  CBC EXTENDED  WBC 4.0 - 10.5 K/uL 8.4  7.6  7.9   RBC 4.22 - 5.81 MIL/uL 4.01  4.11  3.92   Hemoglobin 13.0 - 17.0 g/dL 34.7  42.5  95.6   HCT 39.0 - 52.0 % 33.0  34.8  32.5   Platelets 150 - 400 K/uL 174  203  350   NEUT# 1.7 - 7.7 K/uL 6.4  5.7  5.7   Lymph# 0.7 - 4.0 K/uL 0.9  0.8  1.0      There is no height or weight on file to calculate BMI.  Orders:  Orders Placed This Encounter  Procedures   XR HIP UNILAT W OR W/O PELVIS 1V LEFT   XR HIPS BILAT W OR W/O PELVIS 2V   No orders of the defined types were placed in this encounter.    Procedures: No procedures performed  Clinical Data: No additional findings.  ROS:  All other systems negative, except as noted in the HPI. Review of Systems  Objective: Vital Signs: There were no vitals taken for this visit.  Specialty Comments:  No specialty comments available.  PMFS History: Patient Active Problem List   Diagnosis Date Noted   Scrotal wall abscess 12/29/2022   Fournier's gangrene of scrotum 12/27/2022   Glaucoma 12/22/2022   OSA (obstructive sleep apnea) 11/01/2022   Snoring 09/21/2022   Goals of care, counseling/discussion 10/09/2019   On rivaroxaban therapy    Status post colonoscopy with polypectomy    Lower GI bleeding 04/18/2017   Acute blood loss anemia 04/18/2017   Type 2 diabetes mellitus with obesity (HCC) 04/18/2017   Colon cancer screening    Benign neoplasm of  ascending colon    History of DVT (deep vein thrombosis) 06/10/2015   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   Bilateral hydronephrosis 07/21/2014   Acute kidney failure (HCC) 07/20/2014   Abdominal pain 07/20/2014   AKI (acute kidney injury) (HCC) 07/20/2014   Hematuria 02/12/2013   Shock (HCC) 02/12/2013   Adrenal insufficiency (HCC) 02/12/2013   Hypoalbuminemia 11/15/2012   Anemia, unspecified 09/18/2012   Vitamin D deficiency 09/18/2012   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   Iron deficiency anemia 09/18/2012   Diverticulitis of large intestine with perforation 07/26/2011   Multiple myeloma in relapse (HCC) 10/14/2010   Neurogenic bowel 10/14/2010   Paraplegia (HCC) 07/13/2010   Neurogenic bladder 07/11/2010   BACK PAIN, LUMBAR, WITH RADICULOPATHY 07/07/2010   DISEQUILIBRIUM 07/07/2010   Abdominal pain, generalized 07/07/2010   OBESITY, NOS 09/20/2006   Past Medical History:  Diagnosis Date   Adrenal insufficiency (HCC)    on chronic dexamethasone   Anemia    Cancer (HCC)    Coagulopathy (HCC)    on xeralto/ s/p DVT while on coumadin,  IVC in place   Diabetes mellitus without complication (HCC)    type 2   Glaucoma 12/22/2022   Gross hematuria 01/2013   post foley cath procedure   History of blood transfusion 01/2013   Lower GI bleeding 04/18/2017   Multiple myeloma    thoracic T8 with paraplegia s/p resection- on chemo at visit 10/13/10   Multiple myeloma    Multiple myeloma without mention of remission    Neurogenic bladder    Neurogenic bowel    OSA (obstructive sleep apnea) 11/01/2022   Paraplegia (HCC)    Partial small bowel obstruction (HCC) during dec 2011 admission    Family History  Problem Relation Age of Onset   Ovarian cancer Mother    Diabetes Father     Past Surgical History:  Procedure Laterality Date   COLONOSCOPY WITH PROPOFOL N/A 04/12/2017   Procedure: COLONOSCOPY WITH PROPOFOL;  Surgeon: Hilarie Fredrickson, MD;  Location: [facility];  Service: Endoscopy;  Laterality: N/A;   COLONOSCOPY WITH PROPOFOL N/A 04/19/2017   Procedure: COLONOSCOPY WITH PROPOFOL;  Surgeon: Benancio Deeds, MD;  Location: [facility];  Service: Gastroenterology;  Laterality: N/A;   COLOSTOMY  07/20/2011   Procedure: COLOSTOMY;  Surgeon: Rulon Abide, DO;  Location: [facility] OR;  Service: General;;   COLOSTOMY REVISION  07/20/2011   Procedure: COLON RESECTION SIGMOID;  Surgeon: Rulon Abide, DO;  Location[facility];  Service: General;;   CYSTOSCOPY N/A 04/04/2013   Procedure: CYSTOSCOPY WITH LITHALOPAXY;  Surgeon: Sebastian Ache, MD;  Location: [facility];  Service: Urology;  Laterality: N/A;   INCISION AND DRAINAGE ABSCESS N/A 12/25/2022   Procedure: INCISION AND DRAINAGE OF SCROTUM;  Surgeon: Despina Arias, MD;  Location: [facility];  Service: Urology;  Laterality: N/A;   INSERTION OF SUPRAPUBIC CATHETER N/A 04/04/2013   Procedure: INSERTION OF SUPRAPUBIC CATHETER;  Surgeon: Sebastian Ache, MD;  Location: [facility];  Service: Urology;  Laterality: N/A;   LAPAROTOMY  07/20/2011   Procedure: EXPLORATORY LAPAROTOMY;  Surgeon: Rulon Abide, DO;  Location: [facility] OR;  Service: General;  Laterality: N/A;   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

## 2023-08-31 ENCOUNTER — Inpatient Hospital Stay (HOSPITAL_BASED_OUTPATIENT_CLINIC_OR_DEPARTMENT_OTHER): Payer: Medicare (Managed Care) | Admitting: Hematology and Oncology

## 2023-08-31 ENCOUNTER — Inpatient Hospital Stay: Payer: Medicare (Managed Care) | Attending: Physician Assistant

## 2023-08-31 VITALS — BP 131/76 | HR 79 | Temp 97.3°F | Resp 16 | Wt 246.5 lb

## 2023-08-31 DIAGNOSIS — Z86718 Personal history of other venous thrombosis and embolism: Secondary | ICD-10-CM | POA: Insufficient documentation

## 2023-08-31 DIAGNOSIS — R413 Other amnesia: Secondary | ICD-10-CM | POA: Diagnosis not present

## 2023-08-31 DIAGNOSIS — Z87440 Personal history of urinary (tract) infections: Secondary | ICD-10-CM | POA: Insufficient documentation

## 2023-08-31 DIAGNOSIS — C9002 Multiple myeloma in relapse: Secondary | ICD-10-CM

## 2023-08-31 DIAGNOSIS — Z5111 Encounter for antineoplastic chemotherapy: Secondary | ICD-10-CM | POA: Diagnosis not present

## 2023-08-31 LAB — CBC WITH DIFFERENTIAL (CANCER CENTER ONLY)
Abs Immature Granulocytes: 0.05 10*3/uL (ref 0.00–0.07)
Basophils Absolute: 0.1 10*3/uL (ref 0.0–0.1)
Basophils Relative: 1 %
Eosinophils Absolute: 0.3 10*3/uL (ref 0.0–0.5)
Eosinophils Relative: 3 %
HCT: 34.5 % — ABNORMAL LOW (ref 39.0–52.0)
Hemoglobin: 11.4 g/dL — ABNORMAL LOW (ref 13.0–17.0)
Immature Granulocytes: 1 %
Lymphocytes Relative: 12 %
Lymphs Abs: 1 10*3/uL (ref 0.7–4.0)
MCH: 28.1 pg (ref 26.0–34.0)
MCHC: 33 g/dL (ref 30.0–36.0)
MCV: 85 fL (ref 80.0–100.0)
Monocytes Absolute: 0.7 10*3/uL (ref 0.1–1.0)
Monocytes Relative: 8 %
Neutro Abs: 5.9 10*3/uL (ref 1.7–7.7)
Neutrophils Relative %: 75 %
Platelet Count: 410 10*3/uL — ABNORMAL HIGH (ref 150–400)
RBC: 4.06 MIL/uL — ABNORMAL LOW (ref 4.22–5.81)
RDW: 17.3 % — ABNORMAL HIGH (ref 11.5–15.5)
WBC Count: 7.9 10*3/uL (ref 4.0–10.5)
nRBC: 0 % (ref 0.0–0.2)

## 2023-08-31 LAB — CMP (CANCER CENTER ONLY)
ALT: 13 U/L (ref 0–44)
AST: 14 U/L — ABNORMAL LOW (ref 15–41)
Albumin: 3.9 g/dL (ref 3.5–5.0)
Alkaline Phosphatase: 96 U/L (ref 38–126)
Anion gap: 7 (ref 5–15)
BUN: 11 mg/dL (ref 8–23)
CO2: 28 mmol/L (ref 22–32)
Calcium: 9.2 mg/dL (ref 8.9–10.3)
Chloride: 107 mmol/L (ref 98–111)
Creatinine: 0.71 mg/dL (ref 0.61–1.24)
GFR, Estimated: >60 mL/min (ref >60)
Glucose, Bld: 106 mg/dL — ABNORMAL HIGH (ref 70–99)
Potassium: 3.9 mmol/L (ref 3.5–5.1)
Sodium: 142 mmol/L (ref 135–145)
Total Bilirubin: 0.6 mg/dL (ref 0.0–1.2)
Total Protein: 6.6 g/dL (ref 6.5–8.1)

## 2023-08-31 MED ORDER — SODIUM CHLORIDE 0.9 % IV SOLN: Freq: Once | INTRAVENOUS | Status: AC

## 2023-08-31 MED ORDER — SODIUM CHLORIDE 0.9 % IV SOLN
40.0000 mg | Freq: Once | INTRAVENOUS | Status: AC
  Administered 2023-08-31: 40 mg via INTRAVENOUS
  Filled 2023-08-31: qty 4

## 2023-08-31 MED ORDER — DEXTROSE 5 % IV SOLN
70.0000 mg/m2 | Freq: Once | INTRAVENOUS | Status: AC
  Administered 2023-08-31: 150 mg via INTRAVENOUS
  Filled 2023-08-31: qty 60

## 2023-08-31 NOTE — Progress Notes (Signed)
 [facility] [phone]   [phone]  PROGRESS NOTE  Patient Care Team: Alona Lamar SAILOR, MD as PCP - General (Family Medicine) Babs Arthea DASEN, MD as Consulting Physician (Physical Medicine and Rehabilitation) Pegge Toribio PARAS, PA-C as Physician Assistant (Physical Medicine and Rehabilitation) Federico Norleen DASEN MADISON, MD as Consulting Physician (Hematology and Oncology) Federico Norleen DASEN MADISON, MD as Consulting Physician (Hematology and Oncology) Federico Norleen DASEN MADISON, MD as Consulting Physician (Hematology and Oncology)  Hematological/Oncological History # IgG Lambda Multiple Myeloma, Relapsed (ISS Stage II) 1) 06/2010: initial diagnosis of Multiple Myeloma after T8 compression fracture. Treated with Velcade /Revlimid /Dexamethasone  and achieved a complete remission 2) Velcade  was discontinued in September 2012 and that Revlimid  and Decadron  were discontinued in March 2013. 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 4) Followed by Dr. Lonn, last clinic visit 10/09/2019. At that time there was concern for relapse of his multiple myeloma.  5) Patient requested transfer to different provider after misunderstanding regarding imaging studies 6) 12/17/2019: transfer care to Dr. Federico  7) 01/09/2020: Cycle 1 Day 1 of Dara/Velcade /Dex 8) 01/21/2020: presented as urgent visit for diarrhea and dehydration. Holding chemotherapy scheduled for 01/23/2020. 9) 01/30/2020: Resume dara/velcade /dex after resolution of diarrhea.  10) 02/13/2020: restaging labs show M protein 0.8, Kappa 4.5, lamba 17.2, ratio 0.26, urine M protein 53 (7.1%). All MM labs indicate improvement.  11) 03/10/2020: Cycle 4 Day 1 of Dara/Velcade /Dex. Transition to q 3 week daratumumab .  12) 06/04/2020:  Cycle 8 Day 1 of Dara/Velcade /Dex 13) 06/25/2020:  Cycle 9 Day 1 of Dara/Velcade /Dex 14) 07/22/2020: Cycle 10 Day 1  of  Dara//Dex 15) 08/18/2020: Cycle 11 Day 1 of Dara//Dex 16) 09/23/2020: Cycle 12 Day 1 of Dara//Dex 17) 10/22/2020: Cycle 13 Day 1 of Dara/Dex  18) 11/19/2020: Cycle 14 Day 1 of Dara/Dex  19) 12/17/2020: Cycle 15 Day 1 of Dara/Dex  20) 01/17/2021: Cycle 16 Day 1 of Dara/Dex  21) 02/11/2021: Cycle 17 Day 1 of Dara/Dex 22) 03/10/2021: Cycle 18 Day 1 of Dara/Dex  23) 04/08/2021: Cycle 19 Day 1 of Dara/Dex  24) 08/03/2021: Cycle 20 Day 1 of Dara/Dex (delayed due to scheduling error) 25) 09/02/2021: Cycle 21 Day 1 of Dara/Dex 26) 09/30/2021: Cycle 22 Day 1 of Dara/Dex 27) 10/28/2021: Cycle 23 Day 1 of Dara/Dex 28) 12/02/2021: Cycle 24 Day 1 of Dara/Dex 29) 12/30/2021: Cycle 25 Day 1 of Dara/Dex 30) 01/06/2022: Cycle 1 Day 1 of Kyprolis /Dex 31) 02/03/2022: Cycle 2 Day 1 of Kyprolis /Dex 32) 02/24/2022: Cycle 3 Day 1 of Kyprolis /Dex 33) 04/07/2022: Cycle 4 Day 1 of Kyprolis /Dex 34) 05/05/2022: Cycle 5 Day 1 of Kyprolis /Dex 35) 06/01/2022: Cycle 6 Day 1 of Kyprolis /Dex 36) 06/29/2022: Cycle 7 Day 1 of Kyprolis /Dex 37) 07/28/2022: Cycle 8 Day 1 of Kyprolis /Dex 38) 08/25/2022: Cycle 9 Day 1 of Kyprolis /Dex 39) 09/22/2022: Cycle 10 Day 1 of Kyprolis /Dex 40) 10/20/2022: Cycle 11 Day 1 of Kyprolis /Dex 41) 11/17/2022: Cycle 12 Day 1 of Kyprolis /Dex 42 ) 12/22/2022-01/08/2023: Admitted for fournier's gangrene of scrotum.  43) 01/26/2023: Cycle 13 Day 1 of Kyprolis /Dex 44) 02/16/2023: Cycle 14 Day 1 of Kyprolis /Dex 45) 03/16/2023:Cycle 15 Day 1 of Kyprolis /Dex 46) 04/13/2023: Cycle 16 Day 1 of Kyprolis /Dex 47) 05/11/2023: Cycle 17 Day 1 of Kyprolis /Dex 48) 06/07/2023: Cycle 18 Day 1 of Kyprolis /Dex 49) 07/06/2023: Cycle 19 Day 1 of Kyprolis /Dex 50) 08/02/2023: Cycle 20 Day 1 of Kyprolis /Dex  Interval History:  Vicenta LELON Pass  III 68 y.o. male with medical history significant for  IgG Lambda Multiple Myeloma who presents for a follow up visit. The patient's last visit was on 08/17/2023.  He presents today prior to treatment with  Kyprolis /Dex. He is unaccompanied for this visit.   On exam today Mr. Bradner reports he has been well overall since her last visit.  He reports his New Year's after [employer].  He said no viral illnesses such as runny nose, sore throat, or cough.  He reports his appetite and energy are both very strong.  He reports his treatments are going well and he is tolerating it without any major side effects.  He is not having any numbness or tingling of his fingers or toes and denies any stomach upset such as nausea, vomiting or diarrhea.  He looks forward to rooting for the [employer] this weekend.. Overall he is willing and able to proceed with Kyprolis  therapy at this time.  He denies fevers, chills, night sweats, shortness of breath, chest pain or cough.  He has no other complaints.  Rest of the 10 point ROS is below.  MEDICAL HISTORY:  Past Medical History:  Diagnosis Date   Adrenal insufficiency (HCC)    on chronic dexamethasone    Anemia    Cancer (HCC)    Coagulopathy (HCC)    on xeralto/ s/p DVT while on coumadin,  IVC in place   Diabetes mellitus without complication (HCC)    type 2   Glaucoma 12/22/2022   Gross hematuria 01/2013   post foley cath procedure   History of blood transfusion 01/2013   Lower GI bleeding 04/18/2017   Multiple myeloma    thoracic T8 with paraplegia s/p resection- on chemo at visit 10/13/10   Multiple myeloma    Multiple myeloma without mention of remission    Neurogenic bladder    Neurogenic bowel    OSA (obstructive sleep apnea) 11/01/2022   Paraplegia (HCC)    Partial small bowel obstruction (HCC) during dec 2011 admission    SURGICAL HISTORY: Past Surgical History:  Procedure Laterality Date   COLONOSCOPY WITH PROPOFOL  N/A 04/12/2017   Procedure: COLONOSCOPY WITH PROPOFOL ;  Surgeon: Abran Norleen SAILOR, MD;  Location: [facility];  Service: Endoscopy;  Laterality: N/A;   COLONOSCOPY WITH PROPOFOL  N/A 04/19/2017   Procedure: COLONOSCOPY WITH  PROPOFOL ;  Surgeon: Leigh Elspeth SQUIBB, MD;  Location: [facility];  Service: Gastroenterology;  Laterality: N/A;   COLOSTOMY  07/20/2011   Procedure: COLOSTOMY;  Surgeon: Redell Alm Faith, DO;  Location: [facility] OR;  Service: General;;   COLOSTOMY REVISION  07/20/2011   Procedure: COLON RESECTION SIGMOID;  Surgeon: Redell Alm Faith, DO;  Location: [facility] OR;  Service: General;;   CYSTOSCOPY N/A 04/04/2013   Procedure: CYSTOSCOPY WITH LITHALOPAXY;  Surgeon: Ricardo Likens, MD;  Location: [facility];  Service: Urology;  Laterality: N/A;   INCISION AND DRAINAGE ABSCESS N/A 12/25/2022   Procedure: INCISION AND DRAINAGE OF SCROTUM;  Surgeon: Lovie Arlyss CROME, MD;  Location: [facility];  Service: Urology;  Laterality: N/A;   INSERTION OF SUPRAPUBIC CATHETER N/A 04/04/2013   Procedure: INSERTION OF SUPRAPUBIC CATHETER;  Surgeon: Ricardo Likens, MD;  Location: [facility];  Service: Urology;  Laterality: N/A;   LAPAROTOMY  07/20/2011   Procedure: EXPLORATORY LAPAROTOMY;  Surgeon: Redell Alm Faith, DO;  Location: [facility] OR;  Service: General;  Laterality: N/A;   myeloma thoracic T8 with parpaplegia s/p thoracotomy and thoracic T7-9 cage placement on Dec 26th 2011  07/18/10  SCROTAL EXPLORATION N/A 12/29/2022   Procedure: SCROTAL WOUND DEBRIDEMENT AND CLOSURE;  Surgeon: Alvaro Ricardo KATHEE Mickey., MD;  Location: [facility];  Service: Urology;  Laterality: N/A;    SOCIAL HISTORY: Social History   Socioeconomic History   Marital status: Married    Spouse name: Not on file   Number of children: Not on file   Years of education: Not on file   Highest education level: Not on file  Occupational History   Not on file  Tobacco Use   Smoking status: Never   Smokeless tobacco: Never  Vaping Use   Vaping status: Never Used  Substance and Sexual Activity   Alcohol use: No   Drug use: No   Sexual activity: Never  Other Topics Concern   Not on file  Social History Narrative   Not on file   Social Drivers of Health    Financial Resource Strain: Not on file  Food Insecurity: No Food Insecurity (12/22/2022)   Hunger Vital Sign    Worried About Running Out of Food in the Last Year: Never true    Ran Out of Food in the Last Year: Never true  Transportation Needs: No Transportation Needs (12/22/2022)   PRAPARE - Administrator, Civil Service (Medical): No    Lack of Transportation (Non-Medical): No  Physical Activity: Not on file  Stress: Not on file  Social Connections: Not on file  Intimate Partner Violence: Not At Risk (12/22/2022)   Humiliation, Afraid, Rape, and Kick questionnaire    Fear of Current or Ex-Partner: No    Emotionally Abused: No    Physically Abused: No    Sexually Abused: No    FAMILY HISTORY: Family History  Problem Relation Age of Onset   Ovarian cancer Mother    Diabetes Father     ALLERGIES:  is allergic to ferumoxytol .  MEDICATIONS:  Current Outpatient Medications  Medication Sig Dispense Refill   acetaminophen  (TYLENOL ) 325 MG tablet Take 325-650 mg by mouth every 6 (six) hours as needed for headache or mild pain.     acetic acid 0.25 % irrigation Irrigate with 1 Application as directed See admin instructions. Instill 50 ml's, clamp for 10 minutes, then remove clamp and drain the bladder. Repeat 3 times a week- when not using the Renacidin irrigation     acyclovir  (ZOVIRAX ) 400 MG tablet Take 1 tablet (400 mg total) by mouth 2 (two) times daily. 60 tablet 2   albuterol  (VENTOLIN  HFA) 108 (90 Base) MCG/ACT inhaler Inhale 2 puffs into the lungs every 4 (four) hours as needed for wheezing or shortness of breath.     atorvastatin  (LIPITOR) 20 MG tablet Take 20 mg by mouth daily.     BENADRYL  ALLERGY 25 MG tablet Take 25 mg by mouth every 6 (six) hours as needed (for allergic reactions).     BIOFREEZE 4 % GEL Apply 1 application  topically every 4 (four) hours as needed (for joint pain- to intact areas of the skin only).     Citric Ac-Gluconolact-Mg Carb  (RENACIDIN IR) Irrigate with 30 mLs as directed See admin instructions. Instill 30 ml's, clamp for 10 minutes, then remove clamp and drain the bladder. Repeat 3 times a week- when not using the acetic acid irrigation     COSOPT  PF 2-0.5 % SOLN Place 1 drop into both eyes 2 (two) times daily.     diazepam  (VALIUM ) 5 MG tablet Take 5 mg by mouth 2 (two) times daily as  needed for muscle spasms (after spinal cord injury).     Emollient (EUCERIN EX) Apply 1 application  topically See admin instructions. Apply to affected area 2 times a day     FLOVENT HFA 110 MCG/ACT inhaler 2 each See admin instructions. 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 30 capsule 0   latanoprost  (XALATAN ) 0.005 % ophthalmic solution Place 1 drop into both eyes at bedtime.     Multiple Vitamin (MULTIVITAMIN WITH MINERALS) TABS tablet Take 1 tablet by mouth daily. 30 tablet 0   NON FORMULARY Apply 1 application  topically See admin instructions. Colo Plast paste- Apply as directed with each ostomy bag change     ondansetron  (ZOFRAN ) 4 MG tablet Take 1 tablet (4 mg) by mouth every 6 hours as needed for nausea. 20 tablet 0   rivaroxaban  (XARELTO ) 10 MG TABS tablet Take 1 tablet (10 mg total) by mouth daily with supper. (Patient taking differently: Take 10 mg by mouth daily.) 30 tablet 9   Skin Protectants, Misc. (MINERIN CREME) CREA Apply to affected areas 2 times daily as needed. 454 g 0   Zinc  Oxide (BALMEX EX) Apply 1 application  topically See admin instructions. Apply topically daily to affected sites     No current facility-administered medications for this visit.   Facility-Administered Medications Ordered in Other Visits  Medication Dose Route Frequency Provider Last Rate Last Admin   carfilzomib  (KYPROLIS ) 150 mg in dextrose  5 % 100 mL chemo infusion  70 mg/m2 (Capped) Intravenous Once Keyah Blizard T IV,  MD 350 mL/hr at 08/31/23 1009 150 mg at 08/31/23 1009   sodium chloride  flush (NS) 0.9 % injection 10 mL  10 mL Intravenous PRN Lonn Hicks, MD        REVIEW OF SYSTEMS:   Constitutional: ( - ) fevers, ( - )  chills , ( - ) night sweats Eyes: ( - ) blurriness of vision, ( - ) double vision, ( - ) watery eyes Ears, nose, mouth, throat, and face: ( - ) mucositis, ( - ) sore throat Respiratory: ( - ) cough, ( - ) dyspnea, ( - ) wheezes Cardiovascular: ( - ) palpitation, ( - ) chest discomfort, ( - ) lower extremity swelling Gastrointestinal:  ( - ) nausea, ( - ) heartburn, ( - ) change in bowel habits Skin: ( - ) abnormal skin rashes Lymphatics: ( - ) new lymphadenopathy, ( - ) easy bruising Neurological: ( - ) numbness, ( - ) tingling, ( - ) new weaknesses Behavioral/Psych: ( - ) mood change, ( - ) new changes  All other systems were reviewed with the patient and are negative.  PHYSICAL EXAMINATION: ECOG PERFORMANCE STATUS: paraplegic.   Vitals:   08/31/23 0831  BP: 131/76  Pulse: 79  Resp: 16  Temp: (!) 97.3 F (36.3 C)  SpO2: 100%       Filed Weights   08/31/23 0831  Weight: 246 lb 8 oz (111.8 kg)     GENERAL: well appearing middle aged African American male alert, no distress and comfortable SKIN: skin color, texture, turgor are normal, no rashes or significant lesions EYES: conjunctiva are pink and non-injected, sclera clear LUNGS: clear to auscultation and percussion with normal breathing effort HEART: regular rate & rhythm and no murmurs Musculoskeletal: no cyanosis of digits and no clubbing  PSYCH: alert & oriented  x 3, fluent speech NEURO: paraplegic, no use of LE bilaterally.   LABORATORY DATA:  I have reviewed the data as listed    Latest Ref Rng & Units 08/31/2023    8:10 AM 08/17/2023    8:10 AM 08/10/2023    8:36 AM  CBC  WBC 4.0 - 10.5 K/uL 7.9  8.4  7.6   Hemoglobin 13.0 - 17.0 g/dL 88.5  88.7  88.2   Hematocrit 39.0 - 52.0 % 34.5  33.0  34.8    Platelets 150 - 400 K/uL 410  174  203        Latest Ref Rng & Units 08/31/2023    8:10 AM 08/17/2023    8:10 AM 08/10/2023    8:36 AM  CMP  Glucose 70 - 99 mg/dL 893  91  885   BUN 8 - 23 mg/dL 11  13  15    Creatinine 0.61 - 1.24 mg/dL 9.28  9.24  9.25   Sodium 135 - 145 mmol/L 142  145  142   Potassium 3.5 - 5.1 mmol/L 3.9  3.6  3.7   Chloride 98 - 111 mmol/L 107  112  109   CO2 22 - 32 mmol/L 28  26  26    Calcium  8.9 - 10.3 mg/dL 9.2  9.2  9.9   Total Protein 6.5 - 8.1 g/dL 6.6  6.0  6.4   Total Bilirubin 0.0 - 1.2 mg/dL 0.6  0.8  0.8   Alkaline Phos 38 - 126 U/L 96  88  93   AST 15 - 41 U/L 14  10  11    ALT 0 - 44 U/L 13  15  13      Lab Results  Component Value Date   MPROTEIN Not Observed 08/02/2023   MPROTEIN 0.2 (H) 07/06/2023   MPROTEIN 0.2 (H) 06/07/2023   Lab Results  Component Value Date   KPAFRELGTCHN 7.2 08/02/2023   KPAFRELGTCHN 16.0 07/06/2023   KPAFRELGTCHN 11.1 06/07/2023   LAMBDASER 5.5 (L) 08/02/2023   LAMBDASER 25.5 07/06/2023   LAMBDASER 6.6 06/07/2023   KAPLAMBRATIO 1.31 08/02/2023   KAPLAMBRATIO 0.63 07/06/2023   KAPLAMBRATIO 1.68 (H) 06/07/2023    RADIOGRAPHIC STUDIES: XR HIP UNILAT W OR W/O PELVIS 1V LEFT Result Date: 08/20/2023 2 view radiographs of both hips shows stable lytic lesions of the femur that are unchanged.   ASSESSMENT & PLAN MACON SANDIFORD 68 y.o. male with medical history significant for  IgG Lambda Multiple Myeloma who presents for a follow up visit.  # IgG Lambda Multiple Myeloma, Relapsed (ISS Stage II) --findings are most consistent with relapsed multiple myeloma. Patient previously successfully treated with Velcade /Rev/Dex and Daratumumab /Velcade /Dex. On 07/02/2020 he transitioned to monthly daratumumab  alone.  --due to to rise in M protein, switched to Kyprolis  and Dexamethasone  on 01/06/2022 Plan: --Due for Cycle 21, Day 1 of Kyprolis /Dex today  --Labs show white blood cell count 7.9, hemoglobin 11.4, MCV 85,  platelets 410. Creatinine and LFTs are in range.  --Most recent myeloma labs from 08/02/2023 showed M protein was not detectable with kappa 7.2, lambda 5.5, ratio 1.31.  --Proceed with treatment today without any dose modifications. --Continue with weekly labs plus Kyprolis /Dex treatment as scheduled  #Memory loss: --Encouraged to do memory exercises including puzzles, suduko, etc --If symptoms worsen, we can make a referral to neurology for further evaluation.   #History of Sepsis 2/2 UTI and Fournier's gangrene of scrotum: --Hospitalized from 12/22/2022-01/08/2023. Underwent debridement of necrotic tissue. --Given PIP tazobactam and  Zyvox  initially --Urine culture reveals Proteus mirabilis which is resistant to ciprofloxacin  and nitrofurantoin --Blood cultures negative --D/C on remainder doxycycline  and Augmentin  which he completed on 01/07/2023.  --Evaluated by urology on 7/2 and ID on 7/3 with continued wound healing and no further d/c or evidence of infection.   #History of DVT --He had placement of IVC filter, remains on Xarelto . --Due to poor mobility, and lack of bleeding complications, recommend to continue on Xarelto  indefinitely. --caution if Plt count were to drop <50  #Snoring/Risk for OSA: --Diagnosed with OSA and has CPAP machine.   # Supportive Care -- provided patient with an albuterol  inhaler (for use with daratumumab ) --acyclovir  400mg  BID for VZV prophylaxis --zofran  8mg  q8H PRN for nausea/vomiting  --Zometa  is being held in the setting of his prior episode of osteonecrosis of the jaw  No orders of the defined types were placed in this encounter.   All questions were answered. The patient knows to call the clinic with any problems, questions or concerns.  I have spent a total of 30 minutes minutes of face-to-face and non-face-to-face time, preparing to see the patient,  performing a medically appropriate examination, counseling and educating the patient,  communicating with other health care professionals, documenting clinical information in the electronic health record,  and care coordination.   Norleen IVAR Kidney, MD Department of Hematology/Oncology [facility] Phone: [phone] Pager: [phone] Email: [email or web address]    08/31/2023 10:30 AM   Literature Support:  Bringhen S, Mina R, Petrucci MT, Gaidano G, Ballanti S, Musto P, Offidani M, Spada S, Benevolo G, Ponticelli E, Galieni P, Cavo M, Di Toritto TC, Di Raimondo F, Montefusco V, Palumbo A, Boccadoro M, Larocca A. Once-weekly versus twice-weekly carfilzomib  in patients with newly diagnosed multiple myeloma: a pooled analysis of two phase I/II studies. Haematologica. 2019 Aug;104(8):1640-1647.  --Once-weekly 70 mg/m2 carfilzomib  as induction and maintenance therapy for newly diagnosed multiple myeloma patients was as safe and effective as twice-weekly 36 mg/m2 carfilzomib  and provided a more convenient schedule.

## 2023-08-31 NOTE — Patient Instructions (Signed)
 [facility] - A DEPT OF [facility]  Discharge Instructions: Thank you for choosing [facility] to provide your oncology and hematology care.   If you have a lab appointment with the Cancer Center, please go directly to the Cancer Center and check in at the registration area.   Wear comfortable clothing and clothing appropriate for easy access to any Portacath or PICC line.   We strive to give you quality time with your provider. You may need to reschedule your appointment if you arrive late (15 or more minutes).  Arriving late affects you and other patients whose appointments are after yours.  Also, if you miss three or more appointments without notifying the office, you may be dismissed from the clinic at the provider's discretion.      For prescription refill requests, have your pharmacy contact our office and allow 72 hours for refills to be completed.    Today you received the following chemotherapy and/or immunotherapy agents Kyprolis      To help prevent nausea and vomiting after your treatment, we encourage you to take your nausea medication as directed.  BELOW ARE SYMPTOMS THAT SHOULD BE REPORTED IMMEDIATELY: *FEVER GREATER THAN 100.4 F (38 C) OR HIGHER *CHILLS OR SWEATING *NAUSEA AND VOMITING THAT IS NOT CONTROLLED WITH YOUR NAUSEA MEDICATION *UNUSUAL SHORTNESS OF BREATH *UNUSUAL BRUISING OR BLEEDING *URINARY PROBLEMS (pain or burning when urinating, or frequent urination) *BOWEL PROBLEMS (unusual diarrhea, constipation, pain near the anus) TENDERNESS IN MOUTH AND THROAT WITH OR WITHOUT PRESENCE OF ULCERS (sore throat, sores in mouth, or a toothache) UNUSUAL RASH, SWELLING OR PAIN  UNUSUAL VAGINAL DISCHARGE OR ITCHING   Items with * indicate a potential emergency and should be followed up as soon as possible or go to the Emergency Department if any problems should occur.  Please show the CHEMOTHERAPY ALERT CARD or IMMUNOTHERAPY  ALERT CARD at check-in to the Emergency Department and triage nurse.  Should you have questions after your visit or need to cancel or reschedule your appointment, please contact [facility] - A DEPT OF [facility]  Dept: [phone]  and follow the prompts.  Office hours are 8:00 a.m. to 4:30 p.m. Monday - Friday. Please note that voicemails left after 4:00 p.m. may not be returned until the following business day.  We are closed weekends and major holidays. You have access to a nurse at all times for urgent questions. Please call the main number to the clinic Dept: [phone] and follow the prompts.   For any non-urgent questions, you may also contact your provider using MyChart. We now offer e-Visits for anyone 49 and older to request care online for non-urgent symptoms. For details visit [email or web address].   Also download the MyChart app! Go to the app store, search "MyChart", open the app, select [facility] [place], and log in with your MyChart username and password.

## 2023-09-03 LAB — KAPPA/LAMBDA LIGHT CHAINS
Kappa free light chain: 11.2 mg/L (ref 3.3–19.4)
Kappa, lambda light chain ratio: 2.04 — ABNORMAL HIGH (ref 0.26–1.65)
Lambda free light chains: 5.5 mg/L — ABNORMAL LOW (ref 5.7–26.3)

## 2023-09-04 LAB — MULTIPLE MYELOMA PANEL, SERUM
Albumin SerPl Elph-Mcnc: 3.3 g/dL (ref 2.9–4.4)
Albumin/Glob SerPl: 1.4 (ref 0.7–1.7)
Alpha 1: 0.3 g/dL (ref 0.0–0.4)
Alpha2 Glob SerPl Elph-Mcnc: 0.8 g/dL (ref 0.4–1.0)
B-Globulin SerPl Elph-Mcnc: 1 g/dL (ref 0.7–1.3)
Gamma Glob SerPl Elph-Mcnc: 0.4 g/dL (ref 0.4–1.8)
Globulin, Total: 2.5 g/dL (ref 2.2–3.9)
IgA: 42 mg/dL — ABNORMAL LOW (ref 61–437)
IgG (Immunoglobin G), Serum: 601 mg/dL — ABNORMAL LOW (ref 603–1613)
IgM (Immunoglobulin M), Srm: 17 mg/dL — ABNORMAL LOW (ref 20–172)
Total Protein ELP: 5.8 g/dL — ABNORMAL LOW (ref 6.0–8.5)

## 2023-09-07 ENCOUNTER — Inpatient Hospital Stay: Payer: Medicare (Managed Care)

## 2023-09-07 VITALS — BP 129/71 | HR 85 | Temp 98.2°F | Resp 18

## 2023-09-07 DIAGNOSIS — C9002 Multiple myeloma in relapse: Secondary | ICD-10-CM

## 2023-09-07 DIAGNOSIS — Z5111 Encounter for antineoplastic chemotherapy: Secondary | ICD-10-CM | POA: Diagnosis not present

## 2023-09-07 LAB — CBC WITH DIFFERENTIAL (CANCER CENTER ONLY)
Abs Immature Granulocytes: 0.06 10*3/uL (ref 0.00–0.07)
Basophils Absolute: 0 10*3/uL (ref 0.0–0.1)
Basophils Relative: 0 %
Eosinophils Absolute: 0.3 10*3/uL (ref 0.0–0.5)
Eosinophils Relative: 3 %
HCT: 36.8 % — ABNORMAL LOW (ref 39.0–52.0)
Hemoglobin: 12.2 g/dL — ABNORMAL LOW (ref 13.0–17.0)
Immature Granulocytes: 1 %
Lymphocytes Relative: 11 %
Lymphs Abs: 1.1 10*3/uL (ref 0.7–4.0)
MCH: 28 pg (ref 26.0–34.0)
MCHC: 33.2 g/dL (ref 30.0–36.0)
MCV: 84.6 fL (ref 80.0–100.0)
Monocytes Absolute: 0.8 10*3/uL (ref 0.1–1.0)
Monocytes Relative: 9 %
Neutro Abs: 7.5 10*3/uL (ref 1.7–7.7)
Neutrophils Relative %: 76 %
Platelet Count: 209 10*3/uL (ref 150–400)
RBC: 4.35 MIL/uL (ref 4.22–5.81)
RDW: 17.9 % — ABNORMAL HIGH (ref 11.5–15.5)
WBC Count: 9.7 10*3/uL (ref 4.0–10.5)
nRBC: 0 % (ref 0.0–0.2)

## 2023-09-07 LAB — CMP (CANCER CENTER ONLY)
ALT: 16 U/L (ref 0–44)
AST: 12 U/L — ABNORMAL LOW (ref 15–41)
Albumin: 3.9 g/dL (ref 3.5–5.0)
Alkaline Phosphatase: 93 U/L (ref 38–126)
Anion gap: 6 (ref 5–15)
BUN: 16 mg/dL (ref 8–23)
CO2: 30 mmol/L (ref 22–32)
Calcium: 9.9 mg/dL (ref 8.9–10.3)
Chloride: 108 mmol/L (ref 98–111)
Creatinine: 0.77 mg/dL (ref 0.61–1.24)
GFR, Estimated: >60 mL/min (ref >60)
Glucose, Bld: 97 mg/dL (ref 70–99)
Potassium: 4.3 mmol/L (ref 3.5–5.1)
Sodium: 144 mmol/L (ref 135–145)
Total Bilirubin: 0.7 mg/dL (ref 0.0–1.2)
Total Protein: 6.5 g/dL (ref 6.5–8.1)

## 2023-09-07 MED ORDER — DEXTROSE 5 % IV SOLN
70.0000 mg/m2 | Freq: Once | INTRAVENOUS | Status: AC
  Administered 2023-09-07: 150 mg via INTRAVENOUS
  Filled 2023-09-07: qty 60

## 2023-09-07 MED ORDER — SODIUM CHLORIDE 0.9 % IV SOLN
40.0000 mg | Freq: Once | INTRAVENOUS | Status: AC
  Administered 2023-09-07: 40 mg via INTRAVENOUS
  Filled 2023-09-07: qty 4

## 2023-09-07 MED ORDER — SODIUM CHLORIDE 0.9 % IV SOLN: Freq: Once | INTRAVENOUS | Status: AC

## 2023-09-07 NOTE — Patient Instructions (Signed)
[facility] - A DEPT OF [facility]  Discharge Instructions: Thank you for choosing [facility] to provide your oncology and hematology care.   If you have a lab appointment with the Cancer Center, please go directly to the Cancer Center and check in at the registration area.   Wear comfortable clothing and clothing appropriate for easy access to any Portacath or PICC line.   We strive to give you quality time with your provider. You may need to reschedule your appointment if you arrive late (15 or more minutes).  Arriving late affects you and other patients whose appointments are after yours.  Also, if you miss three or more appointments without notifying the office, you may be dismissed from the clinic at the provider's discretion.      For prescription refill requests, have your pharmacy contact our office and allow 72 hours for refills to be completed.    Today you received the following chemotherapy and/or immunotherapy agents Kyprolis      To help prevent nausea and vomiting after your treatment, we encourage you to take your nausea medication as directed.  BELOW ARE SYMPTOMS THAT SHOULD BE REPORTED IMMEDIATELY: *FEVER GREATER THAN 100.4 F (38 C) OR HIGHER *CHILLS OR SWEATING *NAUSEA AND VOMITING THAT IS NOT CONTROLLED WITH YOUR NAUSEA MEDICATION *UNUSUAL SHORTNESS OF BREATH *UNUSUAL BRUISING OR BLEEDING *URINARY PROBLEMS (pain or burning when urinating, or frequent urination) *BOWEL PROBLEMS (unusual diarrhea, constipation, pain near the anus) TENDERNESS IN MOUTH AND THROAT WITH OR WITHOUT PRESENCE OF ULCERS (sore throat, sores in mouth, or a toothache) UNUSUAL RASH, SWELLING OR PAIN  UNUSUAL VAGINAL DISCHARGE OR ITCHING   Items with * indicate a potential emergency and should be followed up as soon as possible or go to the Emergency Department if any problems should occur.  Please show the CHEMOTHERAPY ALERT CARD or IMMUNOTHERAPY  ALERT CARD at check-in to the Emergency Department and triage nurse.  Should you have questions after your visit or need to cancel or reschedule your appointment, please contact [facility] - A DEPT OF [facility]  Dept: [phone]  and follow the prompts.  Office hours are 8:00 a.m. to 4:30 p.m. Monday - Friday. Please note that voicemails left after 4:00 p.m. may not be returned until the following business day.  We are closed weekends and major holidays. You have access to a nurse at all times for urgent questions. Please call the main number to the clinic Dept: [phone] and follow the prompts.   For any non-urgent questions, you may also contact your provider using MyChart. We now offer e-Visits for anyone 49 and older to request care online for non-urgent symptoms. For details visit [email or web address].   Also download the MyChart app! Go to the app store, search "MyChart", open the app, select [facility], and log in with your MyChart username and password.

## 2023-09-14 ENCOUNTER — Inpatient Hospital Stay: Payer: Medicare (Managed Care) | Admitting: Physician Assistant

## 2023-09-14 ENCOUNTER — Inpatient Hospital Stay: Payer: Medicare (Managed Care)

## 2023-09-14 VITALS — BP 132/75 | HR 77 | Temp 97.7°F | Resp 18 | Ht 71.0 in | Wt 248.4 lb

## 2023-09-14 DIAGNOSIS — Z5111 Encounter for antineoplastic chemotherapy: Secondary | ICD-10-CM | POA: Diagnosis not present

## 2023-09-14 DIAGNOSIS — C9002 Multiple myeloma in relapse: Secondary | ICD-10-CM | POA: Diagnosis not present

## 2023-09-14 LAB — CBC WITH DIFFERENTIAL (CANCER CENTER ONLY)
Abs Immature Granulocytes: 0.05 10*3/uL (ref 0.00–0.07)
Basophils Absolute: 0 10*3/uL (ref 0.0–0.1)
Basophils Relative: 0 %
Eosinophils Absolute: 0.3 10*3/uL (ref 0.0–0.5)
Eosinophils Relative: 3 %
HCT: 33.6 % — ABNORMAL LOW (ref 39.0–52.0)
Hemoglobin: 11.5 g/dL — ABNORMAL LOW (ref 13.0–17.0)
Immature Granulocytes: 1 %
Lymphocytes Relative: 11 %
Lymphs Abs: 0.9 10*3/uL (ref 0.7–4.0)
MCH: 28.4 pg (ref 26.0–34.0)
MCHC: 34.2 g/dL (ref 30.0–36.0)
MCV: 83 fL (ref 80.0–100.0)
Monocytes Absolute: 0.8 10*3/uL (ref 0.1–1.0)
Monocytes Relative: 10 %
Neutro Abs: 6.4 10*3/uL (ref 1.7–7.7)
Neutrophils Relative %: 75 %
Platelet Count: 181 10*3/uL (ref 150–400)
RBC: 4.05 MIL/uL — ABNORMAL LOW (ref 4.22–5.81)
RDW: 18.4 % — ABNORMAL HIGH (ref 11.5–15.5)
WBC Count: 8.5 10*3/uL (ref 4.0–10.5)
nRBC: 0 % (ref 0.0–0.2)

## 2023-09-14 LAB — CMP (CANCER CENTER ONLY)
ALT: 17 U/L (ref 0–44)
AST: 12 U/L — ABNORMAL LOW (ref 15–41)
Albumin: 3.6 g/dL (ref 3.5–5.0)
Alkaline Phosphatase: 88 U/L (ref 38–126)
Anion gap: 4 — ABNORMAL LOW (ref 5–15)
BUN: 13 mg/dL (ref 8–23)
CO2: 28 mmol/L (ref 22–32)
Calcium: 9.2 mg/dL (ref 8.9–10.3)
Chloride: 110 mmol/L (ref 98–111)
Creatinine: 0.81 mg/dL (ref 0.61–1.24)
GFR, Estimated: >60 mL/min (ref >60)
Glucose, Bld: 133 mg/dL — ABNORMAL HIGH (ref 70–99)
Potassium: 3.6 mmol/L (ref 3.5–5.1)
Sodium: 142 mmol/L (ref 135–145)
Total Bilirubin: 0.6 mg/dL (ref 0.0–1.2)
Total Protein: 5.6 g/dL — ABNORMAL LOW (ref 6.5–8.1)

## 2023-09-14 MED ORDER — SODIUM CHLORIDE 0.9 % IV SOLN: Freq: Once | INTRAVENOUS | Status: AC

## 2023-09-14 MED ORDER — DEXTROSE 5 % IV SOLN
70.0000 mg/m2 | Freq: Once | INTRAVENOUS | Status: AC
  Administered 2023-09-14: 150 mg via INTRAVENOUS
  Filled 2023-09-14: qty 60

## 2023-09-14 MED ORDER — SODIUM CHLORIDE 0.9 % IV SOLN
40.0000 mg | Freq: Once | INTRAVENOUS | Status: AC
  Administered 2023-09-14: 40 mg via INTRAVENOUS
  Filled 2023-09-14: qty 4

## 2023-09-14 NOTE — Progress Notes (Signed)
[facility] [phone]   [phone]  PROGRESS NOTE  Patient Care Team: Jethro Bastos, MD as PCP - General (Family Medicine) Ranelle Oyster, MD as Consulting Physician (Physical Medicine and Rehabilitation) Hildred Alamin Mcarthur Rossetti, PA-C as Physician Assistant (Physical Medicine and Rehabilitation) Jaci Standard, MD as Consulting Physician (Hematology and Oncology) Jaci Standard, MD as Consulting Physician (Hematology and Oncology) Jaci Standard, MD as Consulting Physician (Hematology and Oncology)  Hematological/Oncological History # IgG Lambda Multiple Myeloma, Relapsed (ISS Stage II) 1) 06/2010: initial diagnosis of Multiple Myeloma after T8 compression fracture. Treated with Velcade/Revlimid/Dexamethasone and achieved a complete remission 2) Velcade was discontinued in September 2012 and that Revlimid and Decadron were discontinued in March 2013. 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 4) Followed by Dr. Bertis Ruddy, last clinic visit 10/09/2019. At that time there was concern for relapse of his multiple myeloma.  5) Patient requested transfer to different provider after misunderstanding regarding imaging studies 6) 12/17/2019: transfer care to Dr. Leonides Schanz  7) 01/09/2020: Cycle 1 Day 1 of Dara/Velcade/Dex 8) 01/21/2020: presented as urgent visit for diarrhea and dehydration. Holding chemotherapy scheduled for 01/23/2020. 9) 01/30/2020: Resume dara/velcade/dex after resolution of diarrhea.  10) 02/13/2020: restaging labs show M protein 0.8, Kappa 4.5, lamba 17.2, ratio 0.26, urine M protein 53 (7.1%). All MM labs indicate improvement.  11) 03/10/2020: Cycle 4 Day 1 of Dara/Velcade/Dex. Transition to q 3 week daratumumab.  12) 06/04/2020:  Cycle 8 Day 1 of Dara/Velcade/Dex 13) 06/25/2020:  Cycle 9 Day 1 of Dara/Velcade/Dex 14) 07/22/2020: Cycle 10 Day 1  of  Dara//Dex 15) 08/18/2020: Cycle 11 Day 1 of Dara//Dex 16) 09/23/2020: Cycle 12 Day 1 of Dara//Dex 17) 10/22/2020: Cycle 13 Day 1 of Dara/Dex  18) 11/19/2020: Cycle 14 Day 1 of Dara/Dex  19) 12/17/2020: Cycle 15 Day 1 of Dara/Dex  20) 01/17/2021: Cycle 16 Day 1 of Dara/Dex  21) 02/11/2021: Cycle 17 Day 1 of Dara/Dex 22) 03/10/2021: Cycle 18 Day 1 of Dara/Dex  23) 04/08/2021: Cycle 19 Day 1 of Dara/Dex  24) 08/03/2021: Cycle 20 Day 1 of Dara/Dex (delayed due to scheduling error) 25) 09/02/2021: Cycle 21 Day 1 of Dara/Dex 26) 09/30/2021: Cycle 22 Day 1 of Dara/Dex 27) 10/28/2021: Cycle 23 Day 1 of Dara/Dex 28) 12/02/2021: Cycle 24 Day 1 of Dara/Dex 29) 12/30/2021: Cycle 25 Day 1 of Dara/Dex 30) 01/06/2022: Cycle 1 Day 1 of Kyprolis/Dex 31) 02/03/2022: Cycle 2 Day 1 of Kyprolis/Dex 32) 02/24/2022: Cycle 3 Day 1 of Kyprolis/Dex 33) 04/07/2022: Cycle 4 Day 1 of Kyprolis/Dex 34) 05/05/2022: Cycle 5 Day 1 of Kyprolis/Dex 35) 06/01/2022: Cycle 6 Day 1 of Kyprolis/Dex 36) 06/29/2022: Cycle 7 Day 1 of Kyprolis/Dex 37) 07/28/2022: Cycle 8 Day 1 of Kyprolis/Dex 38) 08/25/2022: Cycle 9 Day 1 of Kyprolis/Dex 39) 09/22/2022: Cycle 10 Day 1 of Kyprolis/Dex 40) 10/20/2022: Cycle 11 Day 1 of Kyprolis/Dex 41) 11/17/2022: Cycle 12 Day 1 of Kyprolis/Dex 42 ) 12/22/2022-01/08/2023: Admitted for fournier's gangrene of scrotum.  43) 01/26/2023: Cycle 13 Day 1 of Kyprolis/Dex 44) 02/16/2023: Cycle 14 Day 1 of Kyprolis/Dex 45) 03/16/2023:Cycle 15 Day 1 of Kyprolis/Dex 46) 04/13/2023: Cycle 16 Day 1 of Kyprolis/Dex 47) 05/11/2023: Cycle 17 Day 1 of Kyprolis/Dex 48) 06/07/2023: Cycle 18 Day 1 of Kyprolis/Dex 49) 07/06/2023: Cycle 19 Day 1 of Kyprolis/Dex 50) 08/02/2023: Cycle 20 Day 1 of Kyprolis/Dex 51) 08/31/2023: Cycle 21 Day 1 of  Kyprolis/Dex  Interval History:  [name] 68 y.o. male with medical history significant for  IgG Lambda Multiple Myeloma who presents for a follow up visit. The patient's last visit was on 08/31/2023.   He presents today prior to treatment with Kyprolis/Dex. He is unaccompanied for this visit.   On exam today [name] reports continues to do well without any new or concerning symptoms. His energy is stable and he is trying to exercise more regularly. He has a good appetite without weight changes. He denies nausea, vomiting or bowel habit changes. He denies easy bruising or signs of bleeding. Overall, he is willing and able to proceed with Kyprolis therapy at this time.  He denies fevers, chills, night sweats, shortness of breath, chest pain or cough.  He has no other complaints.  Rest of the 10 point ROS is below.  MEDICAL HISTORY:  Past Medical History:  Diagnosis Date   Adrenal insufficiency (HCC)    on chronic dexamethasone   Anemia    Cancer (HCC)    Coagulopathy (HCC)    on xeralto/ s/p DVT while on coumadin,  IVC in place   Diabetes mellitus without complication (HCC)    type 2   Glaucoma 12/22/2022   Gross hematuria 01/2013   post foley cath procedure   History of blood transfusion 01/2013   Lower GI bleeding 04/18/2017   Multiple myeloma    thoracic T8 with paraplegia s/p resection- on chemo at visit 10/13/10   Multiple myeloma    Multiple myeloma without mention of remission    Neurogenic bladder    Neurogenic bowel    OSA (obstructive sleep apnea) 11/01/2022   Paraplegia (HCC)    Partial small bowel obstruction (HCC) during dec 2011 admission    SURGICAL HISTORY: Past Surgical History:  Procedure Laterality Date   COLONOSCOPY WITH PROPOFOL N/A 04/12/2017   Procedure: COLONOSCOPY WITH PROPOFOL;  Surgeon: Hilarie Fredrickson, MD;  [facility];  Service: Endoscopy;  Laterality: N/A;   COLONOSCOPY WITH PROPOFOL N/A 04/19/2017   Procedure: COLONOSCOPY WITH PROPOFOL;  Surgeon: Benancio Deeds, MD;  Location[facility];  Service: Gastroenterology;  Laterality: N/A;   COLOSTOMY  07/20/2011   Procedure: COLOSTOMY;  Surgeon: Rulon Abide, DO;  Location: [facility] OR;   Service: General;;   COLOSTOMY REVISION  07/20/2011   Procedure: COLON RESECTION SIGMOID;  Surgeon: Rulon Abide, DO;  Location: [facility] OR;  Service: General;;   CYSTOSCOPY N/A 04/04/2013   Procedure: CYSTOSCOPY WITH LITHALOPAXY;  Surgeon: Sebastian Ache, MD;  Location[facility];  Service: Urology;  Laterality: N/A;   INCISION AND DRAINAGE ABSCESS N/A 12/25/2022   Procedure: INCISION AND DRAINAGE OF SCROTUM;  Surgeon: Despina Arias, MD;  Location[facility];  Service: Urology;  Laterality: N/A;   INSERTION OF SUPRAPUBIC CATHETER N/A 04/04/2013   Procedure: INSERTION OF SUPRAPUBIC CATHETER;  Surgeon: Sebastian Ache, MD;  Location: [facility];  Service: Urology;  Laterality: N/A;   LAPAROTOMY  07/20/2011   Procedure: EXPLORATORY LAPAROTOMY;  Surgeon: Rulon Abide, DO;  Location: [facility] OR;  Service: General;  Laterality: N/A;   myeloma thoracic T8 with parpaplegia s/p thoracotomy and thoracic T7-9 cage placement on Dec 26th 2011  07/18/10   SCROTAL EXPLORATION N/A 12/29/2022   Procedure: SCROTAL WOUND DEBRIDEMENT AND CLOSURE;  Surgeon: Loletta Parish., MD;  Location[facility];  Service: Urology;  Laterality: N/A;    SOCIAL HISTORY: Social History   Socioeconomic History   Marital status:  Married    Spouse name: Not on file   Number of children: Not on file   Years of education: Not on file   Highest education level: Not on file  Occupational History   Not on file  Tobacco Use   Smoking status: Never   Smokeless tobacco: Never  Vaping Use   Vaping status: Never Used  Substance and Sexual Activity   Alcohol use: No   Drug use: No   Sexual activity: Never  Other Topics Concern   Not on file  Social History Narrative   Not on file   Social Drivers of Health   Financial Resource Strain: Not on file  Food Insecurity: No Food Insecurity (12/22/2022)   Hunger Vital Sign    Worried About Running Out of Food in the Last Year: Never true    Ran Out of Food in the Last Year: Never true   Transportation Needs: No Transportation Needs (12/22/2022)   PRAPARE - Administrator, Civil Service (Medical): No    Lack of Transportation (Non-Medical): No  Physical Activity: Not on file  Stress: Not on file  Social Connections: Not on file  Intimate Partner Violence: Not At Risk (12/22/2022)   Humiliation, Afraid, Rape, and Kick questionnaire    Fear of Current or Ex-Partner: No    Emotionally Abused: No    Physically Abused: No    Sexually Abused: No    FAMILY HISTORY: Family History  Problem Relation Age of Onset   Ovarian cancer Mother    Diabetes Father     ALLERGIES:  is allergic to ferumoxytol.  MEDICATIONS:  Current Outpatient Medications  Medication Sig Dispense Refill   acetaminophen (TYLENOL) 325 MG tablet Take 325-650 mg by mouth every 6 (six) hours as needed for headache or mild pain.     acetic acid 0.25 % irrigation Irrigate with 1 Application as directed See admin instructions. Instill 50 ml's, clamp for 10 minutes, then remove clamp and drain the bladder. Repeat 3 times a week- when not using the Renacidin irrigation     acyclovir (ZOVIRAX) 400 MG tablet Take 1 tablet (400 mg total) by mouth 2 (two) times daily. 60 tablet 2   albuterol (VENTOLIN HFA) 108 (90 Base) MCG/ACT inhaler Inhale 2 puffs into the lungs every 4 (four) hours as needed for wheezing or shortness of breath.     atorvastatin (LIPITOR) 20 MG tablet Take 20 mg by mouth daily.     BENADRYL ALLERGY 25 MG tablet Take 25 mg by mouth every 6 (six) hours as needed (for allergic reactions).     BIOFREEZE 4 % GEL Apply 1 application  topically every 4 (four) hours as needed (for joint pain- to intact areas of the skin only).     Citric Ac-Gluconolact-Mg Carb (RENACIDIN IR) Irrigate with 30 mLs as directed See admin instructions. Instill 30 ml's, clamp for 10 minutes, then remove clamp and drain the bladder. Repeat 3 times a week- when not using the acetic acid irrigation     COSOPT PF 2-0.5 %  SOLN Place 1 drop into both eyes 2 (two) times daily.     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     Emollient (EUCERIN EX) Apply 1 application  topically See admin instructions. Apply to affected area 2 times a day     FLOVENT HFA 110 MCG/ACT inhaler 2 each See admin  instructions. 2 sprays to colostomy stoma prior to each colostomy bag change     iron polysaccharides (NU-IRON) 150 MG capsule Take 1 capsule (150 mg total) by mouth daily. 30 capsule 0   latanoprost (XALATAN) 0.005 % ophthalmic solution Place 1 drop into both eyes at bedtime.     Multiple Vitamin (MULTIVITAMIN WITH MINERALS) TABS tablet Take 1 tablet by mouth daily. 30 tablet 0   NON FORMULARY Apply 1 application  topically See admin instructions. Colo Plast paste- Apply as directed with each ostomy bag change     ondansetron (ZOFRAN) 4 MG tablet Take 1 tablet (4 mg) by mouth every 6 hours as needed for nausea. 20 tablet 0   rivaroxaban (XARELTO) 10 MG TABS tablet Take 1 tablet (10 mg total) by mouth daily with supper. (Patient taking differently: Take 10 mg by mouth daily.) 30 tablet 9   Skin Protectants, Misc. (MINERIN CREME) CREA Apply to affected areas 2 times daily as needed. 454 g 0   Zinc Oxide (BALMEX EX) Apply 1 application  topically See admin instructions. Apply topically daily to affected sites     No current facility-administered medications for this visit.   Facility-Administered Medications Ordered in Other Visits  Medication Dose Route Frequency Provider Last Rate Last Admin   sodium chloride flush (NS) 0.9 % injection 10 mL  10 mL Intravenous PRN Bertis Ruddy, Ni, MD        REVIEW OF SYSTEMS:   Constitutional: ( - ) fevers, ( - )  chills , ( - ) night sweats Eyes: ( - ) blurriness of vision, ( - ) double vision, ( - ) watery eyes Ears, nose, mouth, throat, and face: ( - ) mucositis, ( - ) sore throat Respiratory: ( - ) cough, ( - )  dyspnea, ( - ) wheezes Cardiovascular: ( - ) palpitation, ( - ) chest discomfort, ( - ) lower extremity swelling Gastrointestinal:  ( - ) nausea, ( - ) heartburn, ( - ) change in bowel habits Skin: ( - ) abnormal skin rashes Lymphatics: ( - ) new lymphadenopathy, ( - ) easy bruising Neurological: ( - ) numbness, ( - ) tingling, ( - ) new weaknesses Behavioral/Psych: ( - ) mood change, ( - ) new changes  All other systems were reviewed with the patient and are negative.  PHYSICAL EXAMINATION: ECOG PERFORMANCE STATUS: paraplegic.   Vitals:   09/14/23 1045  BP: 132/75  Pulse: 77  Resp: 18  Temp: 97.7 F (36.5 C)  SpO2: 100%       Filed Weights   09/14/23 1045  Weight: 248 lb 6.4 oz (112.7 kg)     GENERAL: well appearing middle aged [place] American male alert, no distress and comfortable SKIN: skin color, texture, turgor are normal, no rashes or significant lesions EYES: conjunctiva are pink and non-injected, sclera clear LUNGS: clear to auscultation and percussion with normal breathing effort HEART: regular rate & rhythm and no murmurs Musculoskeletal: no cyanosis of digits and no clubbing  PSYCH: alert & oriented x 3, fluent speech NEURO: paraplegic, no use of LE bilaterally.   LABORATORY DATA:  I have reviewed the data as listed    Latest Ref Rng & Units 09/14/2023   10:00 AM 09/07/2023    8:56 AM 08/31/2023    8:10 AM  CBC  WBC 4.0 - 10.5 K/uL 8.5  9.7  7.9   Hemoglobin 13.0 - 17.0 g/dL 16.1  09.6  04.5   Hematocrit 39.0 - 52.0 %  33.6  36.8  34.5   Platelets 150 - 400 K/uL 181  209  410        Latest Ref Rng & Units 09/14/2023   10:00 AM 09/07/2023    8:56 AM 08/31/2023    8:10 AM  CMP  Glucose 70 - 99 mg/dL 161  97  096   BUN 8 - 23 mg/dL 13  16  11    Creatinine 0.61 - 1.24 mg/dL 0.45  4.09  8.11   Sodium 135 - 145 mmol/L 142  144  142   Potassium 3.5 - 5.1 mmol/L 3.6  4.3  3.9   Chloride 98 - 111 mmol/L 110  108  107   CO2 22 - 32 mmol/L 28  30  28     Calcium 8.9 - 10.3 mg/dL 9.2  9.9  9.2   Total Protein 6.5 - 8.1 g/dL 5.6  6.5  6.6   Total Bilirubin 0.0 - 1.2 mg/dL 0.6  0.7  0.6   Alkaline Phos 38 - 126 U/L 88  93  96   AST 15 - 41 U/L 12  12  14    ALT 0 - 44 U/L 17  16  13      Lab Results  Component Value Date   MPROTEIN Not Observed 08/31/2023   MPROTEIN Not Observed 08/02/2023   MPROTEIN 0.2 (H) 07/06/2023   Lab Results  Component Value Date   KPAFRELGTCHN 11.2 08/31/2023   KPAFRELGTCHN 7.2 08/02/2023   KPAFRELGTCHN 16.0 07/06/2023   LAMBDASER 5.5 (L) 08/31/2023   LAMBDASER 5.5 (L) 08/02/2023   LAMBDASER 25.5 07/06/2023   KAPLAMBRATIO 2.04 (H) 08/31/2023   KAPLAMBRATIO 1.31 08/02/2023   KAPLAMBRATIO 0.63 07/06/2023    RADIOGRAPHIC STUDIES: XR HIP UNILAT W OR W/O PELVIS 1V LEFT Result Date: 08/20/2023 2 view radiographs of both hips shows stable lytic lesions of the femur that are unchanged.   ASSESSMENT & PLAN [name] 68 y.o. male with medical history significant for  IgG Lambda Multiple Myeloma who presents for a follow up visit.  # IgG Lambda Multiple Myeloma, Relapsed (ISS Stage II) --findings are most consistent with relapsed multiple myeloma. Patient previously successfully treated with Velcade/Rev/Dex and Daratumumab/Velcade/Dex. On 07/02/2020 he transitioned to monthly daratumumab alone.  --due to to rise in M protein, switched to Kyprolis and Dexamethasone on 01/06/2022 Plan: --Due for Cycle 21, Day 15 of Kyprolis/Dex today  --Labs show white blood cell count 8.5, hemoglobin 11.5, MCV 83, platelets 181. Creatinine and LFTs are in range.  --Most recent myeloma labs from 08/31/2023 showed M protein was not detectable with kappa 11.2, lambda 5.5, ratio 2.04.  --Proceed with treatment today without any dose modifications. --Continue with weekly labs plus Kyprolis/Dex treatment as scheduled  #Memory loss: --Encouraged to do memory exercises including puzzles, suduko, etc --If symptoms worsen, we can  make a referral to neurology for further evaluation.   #History of Sepsis 2/2 UTI and Fournier's gangrene of scrotum: --Hospitalized from 12/22/2022-01/08/2023. Underwent debridement of necrotic tissue. --Given PIP tazobactam and Zyvox initially --Urine culture reveals Proteus mirabilis which is resistant to ciprofloxacin and nitrofurantoin --Blood cultures negative --D/C on remainder doxycycline and Augmentin which he completed on 01/07/2023.  --Evaluated by urology on 7/2 and ID on 7/3 with continued wound healing and no further d/c or evidence of infection.   #History of DVT --He had placement of IVC filter, remains on Xarelto. --Due to poor mobility, and lack of bleeding complications, recommend to continue on Xarelto indefinitely. --caution if  Plt count were to drop <50  #Snoring/Risk for OSA: --Diagnosed with OSA and has CPAP machine.   # Supportive Care -- provided patient with an albuterol inhaler (for use with daratumumab) --acyclovir 400mg  BID for VZV prophylaxis --zofran 8mg  q8H PRN for nausea/vomiting  --Zometa is being held in the setting of his prior episode of osteonecrosis of the jaw  No orders of the defined types were placed in this encounter.   All questions were answered. The patient knows to call the clinic with any problems, questions or concerns.  I have spent a total of 30 minutes minutes of face-to-face and non-face-to-face time, preparing to see the patient,  performing a medically appropriate examination, counseling and educating the patient, communicating with other health care professionals, documenting clinical information in the electronic health record,  and care coordination.   Gary Kaufmann PA-C Dept of Hematology and Oncology [facility] Phone: [phone]     09/14/2023 1:48 PM   Literature Support:  Milagros Loll, Petrucci MT, [place], [place], [place], [place], Spada S, [place],  Ponticelli E, [place], Cavo M, Di Toritto TC, Haydee Salter F, Montefusco V, Palumbo A, Boccadoro M, Larocca A. Once-weekly versus twice-weekly carfilzomib in patients with newly diagnosed multiple myeloma: a pooled analysis of two phase I/II studies. Haematologica. 2019 Aug;104(8):1640-1647.  --Once-weekly 70 mg/m2 carfilzomib as induction and maintenance therapy for newly diagnosed multiple myeloma patients was as safe and effective as twice-weekly 36 mg/m2 carfilzomib and provided a more convenient schedule.

## 2023-09-16 ENCOUNTER — Other Ambulatory Visit: Payer: Self-pay

## 2023-09-25 ENCOUNTER — Other Ambulatory Visit: Payer: Self-pay

## 2023-09-26 ENCOUNTER — Other Ambulatory Visit: Payer: Self-pay

## 2023-09-28 ENCOUNTER — Inpatient Hospital Stay: Payer: Medicare (Managed Care)

## 2023-09-28 ENCOUNTER — Inpatient Hospital Stay: Payer: Medicare (Managed Care) | Attending: Physician Assistant

## 2023-09-28 ENCOUNTER — Inpatient Hospital Stay (HOSPITAL_BASED_OUTPATIENT_CLINIC_OR_DEPARTMENT_OTHER): Payer: Medicare (Managed Care) | Admitting: Physician Assistant

## 2023-09-28 VITALS — BP 140/75 | HR 73 | Temp 97.2°F | Resp 14 | Wt 242.8 lb

## 2023-09-28 DIAGNOSIS — C9002 Multiple myeloma in relapse: Secondary | ICD-10-CM

## 2023-09-28 DIAGNOSIS — R413 Other amnesia: Secondary | ICD-10-CM | POA: Insufficient documentation

## 2023-09-28 DIAGNOSIS — R0989 Other specified symptoms and signs involving the circulatory and respiratory systems: Secondary | ICD-10-CM | POA: Diagnosis not present

## 2023-09-28 DIAGNOSIS — Z5111 Encounter for antineoplastic chemotherapy: Secondary | ICD-10-CM | POA: Diagnosis present

## 2023-09-28 DIAGNOSIS — R059 Cough, unspecified: Secondary | ICD-10-CM | POA: Diagnosis not present

## 2023-09-28 DIAGNOSIS — Z87440 Personal history of urinary (tract) infections: Secondary | ICD-10-CM | POA: Diagnosis not present

## 2023-09-28 DIAGNOSIS — Z8041 Family history of malignant neoplasm of ovary: Secondary | ICD-10-CM | POA: Diagnosis not present

## 2023-09-28 DIAGNOSIS — Z86718 Personal history of other venous thrombosis and embolism: Secondary | ICD-10-CM | POA: Insufficient documentation

## 2023-09-28 DIAGNOSIS — R067 Sneezing: Secondary | ICD-10-CM | POA: Diagnosis not present

## 2023-09-28 DIAGNOSIS — Z7901 Long term (current) use of anticoagulants: Secondary | ICD-10-CM | POA: Insufficient documentation

## 2023-09-28 LAB — CMP (CANCER CENTER ONLY)
ALT: 16 U/L (ref 0–44)
AST: 14 U/L — ABNORMAL LOW (ref 15–41)
Albumin: 3.9 g/dL (ref 3.5–5.0)
Alkaline Phosphatase: 96 U/L (ref 38–126)
Anion gap: 5 (ref 5–15)
BUN: 14 mg/dL (ref 8–23)
CO2: 28 mmol/L (ref 22–32)
Calcium: 9 mg/dL (ref 8.9–10.3)
Chloride: 111 mmol/L (ref 98–111)
Creatinine: 0.7 mg/dL (ref 0.61–1.24)
GFR, Estimated: >60 mL/min (ref >60)
Glucose, Bld: 103 mg/dL — ABNORMAL HIGH (ref 70–99)
Potassium: 4.1 mmol/L (ref 3.5–5.1)
Sodium: 144 mmol/L (ref 135–145)
Total Bilirubin: 0.5 mg/dL (ref 0.0–1.2)
Total Protein: 6.3 g/dL — ABNORMAL LOW (ref 6.5–8.1)

## 2023-09-28 LAB — CBC WITH DIFFERENTIAL (CANCER CENTER ONLY)
Abs Immature Granulocytes: 0.05 10*3/uL (ref 0.00–0.07)
Basophils Absolute: 0.1 10*3/uL (ref 0.0–0.1)
Basophils Relative: 1 %
Eosinophils Absolute: 0.2 10*3/uL (ref 0.0–0.5)
Eosinophils Relative: 3 %
HCT: 34.6 % — ABNORMAL LOW (ref 39.0–52.0)
Hemoglobin: 11.4 g/dL — ABNORMAL LOW (ref 13.0–17.0)
Immature Granulocytes: 1 %
Lymphocytes Relative: 12 %
Lymphs Abs: 0.9 10*3/uL (ref 0.7–4.0)
MCH: 28.1 pg (ref 26.0–34.0)
MCHC: 32.9 g/dL (ref 30.0–36.0)
MCV: 85.2 fL (ref 80.0–100.0)
Monocytes Absolute: 0.7 10*3/uL (ref 0.1–1.0)
Monocytes Relative: 10 %
Neutro Abs: 5.3 10*3/uL (ref 1.7–7.7)
Neutrophils Relative %: 73 %
Platelet Count: 376 10*3/uL (ref 150–400)
RBC: 4.06 MIL/uL — ABNORMAL LOW (ref 4.22–5.81)
RDW: 17.6 % — ABNORMAL HIGH (ref 11.5–15.5)
WBC Count: 7.2 10*3/uL (ref 4.0–10.5)
nRBC: 0 % (ref 0.0–0.2)

## 2023-09-28 MED ORDER — SODIUM CHLORIDE 0.9 % IV SOLN
40.0000 mg | Freq: Once | INTRAVENOUS | Status: AC
  Administered 2023-09-28: 40 mg via INTRAVENOUS
  Filled 2023-09-28: qty 4

## 2023-09-28 MED ORDER — DEXTROSE 5 % IV SOLN
70.0000 mg/m2 | Freq: Once | INTRAVENOUS | Status: AC
  Administered 2023-09-28: 150 mg via INTRAVENOUS
  Filled 2023-09-28: qty 60

## 2023-09-28 MED ORDER — SODIUM CHLORIDE 0.9 % IV SOLN
Freq: Once | INTRAVENOUS | Status: AC
Start: 2023-09-28 — End: 2023-09-28

## 2023-09-28 NOTE — Patient Instructions (Signed)
 [facility] - A DEPT OF [facility]  Discharge Instructions: Thank you for choosing [facility] to provide your oncology and hematology care.   If you have a lab appointment with the Cancer Center, please go directly to the Cancer Center and check in at the registration area.   Wear comfortable clothing and clothing appropriate for easy access to any Portacath or PICC line.   We strive to give you quality time with your provider. You may need to reschedule your appointment if you arrive late (15 or more minutes).  Arriving late affects you and other patients whose appointments are after yours.  Also, if you miss three or more appointments without notifying the office, you may be dismissed from the clinic at the provider's discretion.      For prescription refill requests, have your pharmacy contact our office and allow 72 hours for refills to be completed.    Today you received the following chemotherapy and/or immunotherapy agents Kyprolis      To help prevent nausea and vomiting after your treatment, we encourage you to take your nausea medication as directed.  BELOW ARE SYMPTOMS THAT SHOULD BE REPORTED IMMEDIATELY: *FEVER GREATER THAN 100.4 F (38 C) OR HIGHER *CHILLS OR SWEATING *NAUSEA AND VOMITING THAT IS NOT CONTROLLED WITH YOUR NAUSEA MEDICATION *UNUSUAL SHORTNESS OF BREATH *UNUSUAL BRUISING OR BLEEDING *URINARY PROBLEMS (pain or burning when urinating, or frequent urination) *BOWEL PROBLEMS (unusual diarrhea, constipation, pain near the anus) TENDERNESS IN MOUTH AND THROAT WITH OR WITHOUT PRESENCE OF ULCERS (sore throat, sores in mouth, or a toothache) UNUSUAL RASH, SWELLING OR PAIN  UNUSUAL VAGINAL DISCHARGE OR ITCHING   Items with * indicate a potential emergency and should be followed up as soon as possible or go to the Emergency Department if any problems should occur.  Please show the CHEMOTHERAPY ALERT CARD or IMMUNOTHERAPY  ALERT CARD at check-in to the Emergency Department and triage nurse.  Should you have questions after your visit or need to cancel or reschedule your appointment, please contact [facility] - A DEPT OF [facility]  Dept: [phone]  and follow the prompts.  Office hours are 8:00 a.m. to 4:30 p.m. Monday - Friday. Please note that voicemails left after 4:00 p.m. may not be returned until the following business day.  We are closed weekends and major holidays. You have access to a nurse at all times for urgent questions. Please call the main number to the clinic Dept: [phone] and follow the prompts.   For any non-urgent questions, you may also contact your provider using MyChart. We now offer e-Visits for anyone 49 and older to request care online for non-urgent symptoms. For details visit [email or web address].   Also download the MyChart app! Go to the app store, search "MyChart", open the app, select [facility] [place], and log in with your MyChart username and password.

## 2023-09-28 NOTE — Progress Notes (Signed)
 [facility] Telephone:([phone]) [phone]   [phone]  PROGRESS NOTE  Patient Care Team: Jethro Bastos, MD as PCP - General (Family Medicine) Ranelle Oyster, MD as Consulting Physician (Physical Medicine and Rehabilitation) Hildred Alamin Mcarthur Rossetti, PA-C as Physician Assistant (Physical Medicine and Rehabilitation) Jaci Standard, MD as Consulting Physician (Hematology and Oncology) Jaci Standard, MD as Consulting Physician (Hematology and Oncology) Jaci Standard, MD as Consulting Physician (Hematology and Oncology)  Hematological/Oncological History # IgG Lambda Multiple Myeloma, Relapsed (ISS Stage II) 1) 06/2010: initial diagnosis of Multiple Myeloma after T8 compression fracture. Treated with Velcade/Revlimid/Dexamethasone and achieved a complete remission 2) Velcade was discontinued in September 2012 and that Revlimid and Decadron were discontinued in March 2013. 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 4) Followed by Dr. Bertis Ruddy, last clinic visit 10/09/2019. At that time there was concern for relapse of his multiple myeloma.  5) Patient requested transfer to different provider after misunderstanding regarding imaging studies 6) 12/17/2019: transfer care to Dr. Leonides Schanz  7) 01/09/2020: Cycle 1 Day 1 of Dara/Velcade/Dex 8) 01/21/2020: presented as urgent visit for diarrhea and dehydration. Holding chemotherapy scheduled for 01/23/2020. 9) 01/30/2020: Resume dara/velcade/dex after resolution of diarrhea.  10) 02/13/2020: restaging labs show M protein 0.8, Kappa 4.5, lamba 17.2, ratio 0.26, urine M protein 53 (7.1%). All MM labs indicate improvement.  11) 03/10/2020: Cycle 4 Day 1 of Dara/Velcade/Dex. Transition to q 3 week daratumumab.  12) 06/04/2020:  Cycle 8 Day 1 of Dara/Velcade/Dex 13) 06/25/2020:  Cycle 9 Day 1 of Dara/Velcade/Dex 14) 07/22/2020: Cycle 10 Day 1  of  Dara//Dex 15) 08/18/2020: Cycle 11 Day 1 of Dara//Dex 16) 09/23/2020: Cycle 12 Day 1 of Dara//Dex 17) 10/22/2020: Cycle 13 Day 1 of Dara/Dex  18) 11/19/2020: Cycle 14 Day 1 of Dara/Dex  19) 12/17/2020: Cycle 15 Day 1 of Dara/Dex  20) 01/17/2021: Cycle 16 Day 1 of Dara/Dex  21) 02/11/2021: Cycle 17 Day 1 of Dara/Dex 22) 03/10/2021: Cycle 18 Day 1 of Dara/Dex  23) 04/08/2021: Cycle 19 Day 1 of Dara/Dex  24) 08/03/2021: Cycle 20 Day 1 of Dara/Dex (delayed due to scheduling error) 25) 09/02/2021: Cycle 21 Day 1 of Dara/Dex 26) 09/30/2021: Cycle 22 Day 1 of Dara/Dex 27) 10/28/2021: Cycle 23 Day 1 of Dara/Dex 28) 12/02/2021: Cycle 24 Day 1 of Dara/Dex 29) 12/30/2021: Cycle 25 Day 1 of Dara/Dex 30) 01/06/2022: Cycle 1 Day 1 of Kyprolis/Dex 31) 02/03/2022: Cycle 2 Day 1 of Kyprolis/Dex 32) 02/24/2022: Cycle 3 Day 1 of Kyprolis/Dex 33) 04/07/2022: Cycle 4 Day 1 of Kyprolis/Dex 34) 05/05/2022: Cycle 5 Day 1 of Kyprolis/Dex 35) 06/01/2022: Cycle 6 Day 1 of Kyprolis/Dex 36) 06/29/2022: Cycle 7 Day 1 of Kyprolis/Dex 37) 07/28/2022: Cycle 8 Day 1 of Kyprolis/Dex 38) 08/25/2022: Cycle 9 Day 1 of Kyprolis/Dex 39) 09/22/2022: Cycle 10 Day 1 of Kyprolis/Dex 40) 10/20/2022: Cycle 11 Day 1 of Kyprolis/Dex 41) 11/17/2022: Cycle 12 Day 1 of Kyprolis/Dex 42 ) 12/22/2022-01/08/2023: Admitted for fournier's gangrene of scrotum.  43) 01/26/2023: Cycle 13 Day 1 of Kyprolis/Dex 44) 02/16/2023: Cycle 14 Day 1 of Kyprolis/Dex 45) 03/16/2023:Cycle 15 Day 1 of Kyprolis/Dex 46) 04/13/2023: Cycle 16 Day 1 of Kyprolis/Dex 47) 05/11/2023: Cycle 17 Day 1 of Kyprolis/Dex 48) 06/07/2023: Cycle 18 Day 1 of Kyprolis/Dex 49) 07/06/2023: Cycle 19 Day 1 of Kyprolis/Dex 50) 08/02/2023: Cycle 20 Day 1 of Kyprolis/Dex 51) 08/31/2023: Cycle 21 Day 1 of  Kyprolis/Dex 52) 09/28/2023: Cycle 22 Day 1 of Kyprolis/Dex  Interval History:  [name] 68 y.o. male with medical history significant for  IgG Lambda Multiple Myeloma who presents for a follow up  visit. The patient's last visit was on 09/14/2023.  He presents today prior to treatment with Kyprolis/Dex. He is unaccompanied for this visit.   On exam today [name] reports continues to do well without any new or concerning symptoms. He is tolerating treatment without any toxicities. His energy is stable and appetite is unchanged. He denies any nausea, vomiting or bowel habit changes. He denies easy bruising or signs of bleeding. He denies any bone pain. He denies fevers, chills, night sweats, shortness of breath, chest pain or cough.  He has no other complaints.  Rest of the 10 point ROS is below.  MEDICAL HISTORY:  Past Medical History:  Diagnosis Date   Adrenal insufficiency (HCC)    on chronic dexamethasone   Anemia    Cancer (HCC)    Coagulopathy (HCC)    on xeralto/ s/p DVT while on coumadin,  IVC in place   Diabetes mellitus without complication (HCC)    type 2   Glaucoma 12/22/2022   Gross hematuria 01/2013   post foley cath procedure   History of blood transfusion 01/2013   Lower GI bleeding 04/18/2017   Multiple myeloma    thoracic T8 with paraplegia s/p resection- on chemo at visit 10/13/10   Multiple myeloma    Multiple myeloma without mention of remission    Neurogenic bladder    Neurogenic bowel    OSA (obstructive sleep apnea) 11/01/2022   Paraplegia (HCC)    Partial small bowel obstruction (HCC) during dec 2011 admission    SURGICAL HISTORY: Past Surgical History:  Procedure Laterality Date   COLONOSCOPY WITH PROPOFOL N/A 04/12/2017   Procedure: COLONOSCOPY WITH PROPOFOL;  Surgeon: Hilarie Fredrickson, MD;  Location: [facility];  Service: Endoscopy;  Laterality: N/A;   COLONOSCOPY WITH PROPOFOL N/A 04/19/2017   Procedure: COLONOSCOPY WITH PROPOFOL;  Surgeon: Benancio Deeds, MD;  Location: [facility];  Service: Gastroenterology;  Laterality: N/A;   COLOSTOMY  07/20/2011   Procedure: COLOSTOMY;  Surgeon: Rulon Abide, DO;  Location: [facility] OR;  Service:  General;;   COLOSTOMY REVISION  07/20/2011   Procedure: COLON RESECTION SIGMOID;  Surgeon: Rulon Abide, DO;  Location: [facility] OR;  Service: General;;   CYSTOSCOPY N/A 04/04/2013   Procedure: CYSTOSCOPY WITH LITHALOPAXY;  Surgeon: Sebastian Ache, MD;  Location: [facility];  Service: Urology;  Laterality: N/A;   INCISION AND DRAINAGE ABSCESS N/A 12/25/2022   Procedure: INCISION AND DRAINAGE OF SCROTUM;  Surgeon: Despina Arias, MD;  Location: [facility];  Service: Urology;  Laterality: N/A;   INSERTION OF SUPRAPUBIC CATHETER N/A 04/04/2013   Procedure: INSERTION OF SUPRAPUBIC CATHETER;  Surgeon: Sebastian Ache, MD;  Location: [facility];  Service: Urology;  Laterality: N/A;   LAPAROTOMY  07/20/2011   Procedure: EXPLORATORY LAPAROTOMY;  Surgeon: Rulon Abide, DO;  Location: [facility] OR;  Service: General;  Laterality: N/A;   myeloma thoracic T8 with parpaplegia s/p thoracotomy and thoracic T7-9 cage placement on Dec 26th 2011  07/18/10   SCROTAL EXPLORATION N/A 12/29/2022   Procedure: SCROTAL WOUND DEBRIDEMENT AND CLOSURE;  Surgeon: Loletta Parish., MD;  Location: [facility];  Service: Urology;  Laterality: N/A;    SOCIAL HISTORY: Social History   Socioeconomic History   Marital status: Married    Spouse name:  Not on file   Number of children: Not on file   Years of education: Not on file   Highest education level: Not on file  Occupational History   Not on file  Tobacco Use   Smoking status: Never   Smokeless tobacco: Never  Vaping Use   Vaping status: Never Used  Substance and Sexual Activity   Alcohol use: No   Drug use: No   Sexual activity: Never  Other Topics Concern   Not on file  Social History Narrative   Not on file   Social Drivers of Health   Financial Resource Strain: Not on file  Food Insecurity: No Food Insecurity (12/22/2022)   Hunger Vital Sign    Worried About Running Out of Food in the Last Year: Never true    Ran Out of Food in the Last Year: Never true   Transportation Needs: No Transportation Needs (12/22/2022)   PRAPARE - Administrator, Civil Service (Medical): No    Lack of Transportation (Non-Medical): No  Physical Activity: Not on file  Stress: Not on file  Social Connections: Not on file  Intimate Partner Violence: Not At Risk (12/22/2022)   Humiliation, Afraid, Rape, and Kick questionnaire    Fear of Current or Ex-Partner: No    Emotionally Abused: No    Physically Abused: No    Sexually Abused: No    FAMILY HISTORY: Family History  Problem Relation Age of Onset   Ovarian cancer Mother    Diabetes Father     ALLERGIES:  is allergic to ferumoxytol.  MEDICATIONS:  Current Outpatient Medications  Medication Sig Dispense Refill   acetaminophen (TYLENOL) 325 MG tablet Take 325-650 mg by mouth every 6 (six) hours as needed for headache or mild pain.     acetic acid 0.25 % irrigation Irrigate with 1 Application as directed See admin instructions. Instill 50 ml's, clamp for 10 minutes, then remove clamp and drain the bladder. Repeat 3 times a week- when not using the Renacidin irrigation     acyclovir (ZOVIRAX) 400 MG tablet Take 1 tablet (400 mg total) by mouth 2 (two) times daily. 60 tablet 2   albuterol (VENTOLIN HFA) 108 (90 Base) MCG/ACT inhaler Inhale 2 puffs into the lungs every 4 (four) hours as needed for wheezing or shortness of breath.     atorvastatin (LIPITOR) 20 MG tablet Take 20 mg by mouth daily.     BENADRYL ALLERGY 25 MG tablet Take 25 mg by mouth every 6 (six) hours as needed (for allergic reactions).     BIOFREEZE 4 % GEL Apply 1 application  topically every 4 (four) hours as needed (for joint pain- to intact areas of the skin only).     Citric Ac-Gluconolact-Mg Carb (RENACIDIN IR) Irrigate with 30 mLs as directed See admin instructions. Instill 30 ml's, clamp for 10 minutes, then remove clamp and drain the bladder. Repeat 3 times a week- when not using the acetic acid irrigation     COSOPT PF 2-0.5 %  SOLN Place 1 drop into both eyes 2 (two) times daily.     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     Emollient (EUCERIN EX) Apply 1 application  topically See admin instructions. Apply to affected area 2 times a day     FLOVENT HFA 110 MCG/ACT inhaler 2 each See admin instructions. 2 sprays to colostomy stoma  prior to each colostomy bag change     iron polysaccharides (NU-IRON) 150 MG capsule Take 1 capsule (150 mg total) by mouth daily. 30 capsule 0   latanoprost (XALATAN) 0.005 % ophthalmic solution Place 1 drop into both eyes at bedtime.     Multiple Vitamin (MULTIVITAMIN WITH MINERALS) TABS tablet Take 1 tablet by mouth daily. 30 tablet 0   NON FORMULARY Apply 1 application  topically See admin instructions. Colo Plast paste- Apply as directed with each ostomy bag change     ondansetron (ZOFRAN) 4 MG tablet Take 1 tablet (4 mg) by mouth every 6 hours as needed for nausea. 20 tablet 0   rivaroxaban (XARELTO) 10 MG TABS tablet Take 1 tablet (10 mg total) by mouth daily with supper. (Patient taking differently: Take 10 mg by mouth daily.) 30 tablet 9   Skin Protectants, Misc. (MINERIN CREME) CREA Apply to affected areas 2 times daily as needed. 454 g 0   Zinc Oxide (BALMEX EX) Apply 1 application  topically See admin instructions. Apply topically daily to affected sites     No current facility-administered medications for this visit.   Facility-Administered Medications Ordered in Other Visits  Medication Dose Route Frequency Provider Last Rate Last Admin   sodium chloride flush (NS) 0.9 % injection 10 mL  10 mL Intravenous PRN Bertis Ruddy, Ni, MD        REVIEW OF SYSTEMS:   Constitutional: ( - ) fevers, ( - )  chills , ( - ) night sweats Eyes: ( - ) blurriness of vision, ( - ) double vision, ( - ) watery eyes Ears, nose, mouth, throat, and face: ( - ) mucositis, ( - ) sore throat Respiratory: ( - ) cough, ( - )  dyspnea, ( - ) wheezes Cardiovascular: ( - ) palpitation, ( - ) chest discomfort, ( - ) lower extremity swelling Gastrointestinal:  ( - ) nausea, ( - ) heartburn, ( - ) change in bowel habits Skin: ( - ) abnormal skin rashes Lymphatics: ( - ) new lymphadenopathy, ( - ) easy bruising Neurological: ( - ) numbness, ( - ) tingling, ( - ) new weaknesses Behavioral/Psych: ( - ) mood change, ( - ) new changes  All other systems were reviewed with the patient and are negative.  PHYSICAL EXAMINATION: ECOG PERFORMANCE STATUS: paraplegic.   Vitals:   09/28/23 1046  BP: (!) 140/75  Pulse: 73  Resp: 14  Temp: (!) 97.2 F (36.2 C)  SpO2: 100%       Filed Weights   09/28/23 1046  Weight: 242 lb 12.8 oz (110.1 kg)     GENERAL: well appearing middle aged [place] American male alert, no distress and comfortable SKIN: skin color, texture, turgor are normal, no rashes or significant lesions EYES: conjunctiva are pink and non-injected, sclera clear LUNGS: clear to auscultation and percussion with normal breathing effort HEART: regular rate & rhythm and no murmurs Musculoskeletal: no cyanosis of digits and no clubbing  PSYCH: alert & oriented x 3, fluent speech NEURO: paraplegic, no use of LE bilaterally.   LABORATORY DATA:  I have reviewed the data as listed    Latest Ref Rng & Units 09/28/2023   10:03 AM 09/14/2023   10:00 AM 09/07/2023    8:56 AM  CBC  WBC 4.0 - 10.5 K/uL 7.2  8.5  9.7   Hemoglobin 13.0 - 17.0 g/dL 16.1  09.6  04.5   Hematocrit 39.0 - 52.0 % 34.6  33.6  36.8  Platelets 150 - 400 K/uL 376  181  209        Latest Ref Rng & Units 09/28/2023   10:03 AM 09/14/2023   10:00 AM 09/07/2023    8:56 AM  CMP  Glucose 70 - 99 mg/dL 161  096  97   BUN 8 - 23 mg/dL 14  13  16    Creatinine 0.61 - 1.24 mg/dL 0.45  4.09  8.11   Sodium 135 - 145 mmol/L 144  142  144   Potassium 3.5 - 5.1 mmol/L 4.1  3.6  4.3   Chloride 98 - 111 mmol/L 111  110  108   CO2 22 - 32 mmol/L 28  28   30    Calcium 8.9 - 10.3 mg/dL 9.0  9.2  9.9   Total Protein 6.5 - 8.1 g/dL 6.3  5.6  6.5   Total Bilirubin 0.0 - 1.2 mg/dL 0.5  0.6  0.7   Alkaline Phos 38 - 126 U/L 96  88  93   AST 15 - 41 U/L 14  12  12    ALT 0 - 44 U/L 16  17  16      Lab Results  Component Value Date   MPROTEIN Not Observed 08/31/2023   MPROTEIN Not Observed 08/02/2023   MPROTEIN 0.2 (H) 07/06/2023   Lab Results  Component Value Date   KPAFRELGTCHN 11.2 08/31/2023   KPAFRELGTCHN 7.2 08/02/2023   KPAFRELGTCHN 16.0 07/06/2023   LAMBDASER 5.5 (L) 08/31/2023   LAMBDASER 5.5 (L) 08/02/2023   LAMBDASER 25.5 07/06/2023   KAPLAMBRATIO 2.04 (H) 08/31/2023   KAPLAMBRATIO 1.31 08/02/2023   KAPLAMBRATIO 0.63 07/06/2023    RADIOGRAPHIC STUDIES: No results found.   ASSESSMENT & PLAN [name] is a 68 y.o.  male with medical history significant for  IgG Lambda Multiple Myeloma who presents for a follow up visit.  # IgG Lambda Multiple Myeloma, Relapsed (ISS Stage II) --findings are most consistent with relapsed multiple myeloma. Patient previously successfully treated with Velcade/Rev/Dex and Daratumumab/Velcade/Dex. On 07/02/2020 he transitioned to monthly daratumumab alone.  --due to to rise in M protein, switched to Kyprolis and Dexamethasone on 01/06/2022 Plan: --Due for Cycle 22, Day 1 of Kyprolis/Dex today  --Labs show white blood cell count 7.2, hemoglobin 11.4, MCV 85.2, platelets 376. Creatinine and LFTs are in range.  --Most recent myeloma labs from 08/31/2023 showed M protein was not detectable with kappa 11.2, lambda 5.5, ratio 2.04.  --Proceed with treatment today without any dose modifications. --Continue with weekly labs plus Kyprolis/Dex treatment as scheduled  #Memory loss: --Encouraged to do memory exercises including puzzles, suduko, etc --If symptoms worsen, we can make a referral to neurology for further evaluation.   #History of Sepsis 2/2 UTI and Fournier's gangrene of  scrotum: --Hospitalized from 12/22/2022-01/08/2023. Underwent debridement of necrotic tissue. --Given PIP tazobactam and Zyvox initially --Urine culture reveals Proteus mirabilis which is resistant to ciprofloxacin and nitrofurantoin --Blood cultures negative --D/C on remainder doxycycline and Augmentin which he completed on 01/07/2023.  --Evaluated by urology on 7/2 and ID on 7/3 with continued wound healing and no further d/c or evidence of infection.   #History of DVT --He had placement of IVC filter, remains on Xarelto. --Due to poor mobility, and lack of bleeding complications, recommend to continue on Xarelto indefinitely. --caution if Plt count were to drop <50  #Snoring/Risk for OSA: --Diagnosed with OSA and has CPAP machine.   # Supportive Care -- provided patient with an albuterol inhaler (for  use with daratumumab) --acyclovir 400mg  BID for VZV prophylaxis --zofran 8mg  q8H PRN for nausea/vomiting  --Zometa is being held in the setting of his prior episode of osteonecrosis of the jaw  No orders of the defined types were placed in this encounter.   All questions were answered. The patient knows to call the clinic with any problems, questions or concerns.  I have spent a total of 30 minutes minutes of face-to-face and non-face-to-face time, preparing to see the patient,  performing a medically appropriate examination, counseling and educating the patient, communicating with other health care professionals, documenting clinical information in the electronic health record,  and care coordination.   Georga Kaufmann PA-C Dept of Hematology and Oncology [facility] Phone: [phone]    09/28/2023 10:55 AM   Literature Support:  Milagros Loll, Petrucci [place], [place], [place], [place], [place], [place], [place], Ponticelli E, [place], [place], Di Toritto TC, Haydee Salter F, [place] V, Palumbo A, Boccadoro M, [place] A.  Once-weekly versus twice-weekly carfilzomib in patients with newly diagnosed multiple myeloma: a pooled analysis of two phase I/II studies. Haematologica. 2019 Aug;104(8):1640-1647.  --Once-weekly 70 mg/m2 carfilzomib as induction and maintenance therapy for newly diagnosed multiple myeloma patients was as safe and effective as twice-weekly 36 mg/m2 carfilzomib and provided a more convenient schedule.

## 2023-10-05 ENCOUNTER — Inpatient Hospital Stay: Payer: Medicare (Managed Care)

## 2023-10-05 ENCOUNTER — Other Ambulatory Visit: Payer: Self-pay | Admitting: Hematology and Oncology

## 2023-10-05 ENCOUNTER — Ambulatory Visit: Payer: Medicare (Managed Care)

## 2023-10-05 ENCOUNTER — Other Ambulatory Visit: Payer: Medicare (Managed Care)

## 2023-10-05 VITALS — BP 145/72 | HR 60 | Temp 98.1°F | Resp 16

## 2023-10-05 DIAGNOSIS — C9002 Multiple myeloma in relapse: Secondary | ICD-10-CM

## 2023-10-05 DIAGNOSIS — Z5111 Encounter for antineoplastic chemotherapy: Secondary | ICD-10-CM | POA: Diagnosis not present

## 2023-10-05 LAB — CBC WITH DIFFERENTIAL (CANCER CENTER ONLY)
Abs Immature Granulocytes: 0.07 10*3/uL (ref 0.00–0.07)
Basophils Absolute: 0 10*3/uL (ref 0.0–0.1)
Basophils Relative: 1 %
Eosinophils Absolute: 0.3 10*3/uL (ref 0.0–0.5)
Eosinophils Relative: 3 %
HCT: 33.9 % — ABNORMAL LOW (ref 39.0–52.0)
Hemoglobin: 11.6 g/dL — ABNORMAL LOW (ref 13.0–17.0)
Immature Granulocytes: 1 %
Lymphocytes Relative: 12 %
Lymphs Abs: 1 10*3/uL (ref 0.7–4.0)
MCH: 28.2 pg (ref 26.0–34.0)
MCHC: 34.2 g/dL (ref 30.0–36.0)
MCV: 82.3 fL (ref 80.0–100.0)
Monocytes Absolute: 0.8 10*3/uL (ref 0.1–1.0)
Monocytes Relative: 9 %
Neutro Abs: 6.5 10*3/uL (ref 1.7–7.7)
Neutrophils Relative %: 74 %
Platelet Count: 185 10*3/uL (ref 150–400)
RBC: 4.12 MIL/uL — ABNORMAL LOW (ref 4.22–5.81)
RDW: 17.9 % — ABNORMAL HIGH (ref 11.5–15.5)
WBC Count: 8.8 10*3/uL (ref 4.0–10.5)
nRBC: 0 % (ref 0.0–0.2)

## 2023-10-05 LAB — LACTATE DEHYDROGENASE: LDH: 149 U/L (ref 98–192)

## 2023-10-05 LAB — CMP (CANCER CENTER ONLY)
ALT: 17 U/L (ref 0–44)
AST: 12 U/L — ABNORMAL LOW (ref 15–41)
Albumin: 3.9 g/dL (ref 3.5–5.0)
Alkaline Phosphatase: 92 U/L (ref 38–126)
Anion gap: 7 (ref 5–15)
BUN: 16 mg/dL (ref 8–23)
CO2: 27 mmol/L (ref 22–32)
Calcium: 9.2 mg/dL (ref 8.9–10.3)
Chloride: 110 mmol/L (ref 98–111)
Creatinine: 0.78 mg/dL (ref 0.61–1.24)
GFR, Estimated: >60 mL/min (ref >60)
Glucose, Bld: 97 mg/dL (ref 70–99)
Potassium: 3.6 mmol/L (ref 3.5–5.1)
Sodium: 144 mmol/L (ref 135–145)
Total Bilirubin: 0.7 mg/dL (ref 0.0–1.2)
Total Protein: 6.4 g/dL — ABNORMAL LOW (ref 6.5–8.1)

## 2023-10-05 MED ORDER — SODIUM CHLORIDE 0.9 % IV SOLN
40.0000 mg | Freq: Once | INTRAVENOUS | Status: AC
  Administered 2023-10-05: 40 mg via INTRAVENOUS
  Filled 2023-10-05: qty 4

## 2023-10-05 MED ORDER — SODIUM CHLORIDE 0.9 % IV SOLN: Freq: Once | INTRAVENOUS | Status: AC

## 2023-10-05 MED ORDER — DEXTROSE 5 % IV SOLN
70.0000 mg/m2 | Freq: Once | INTRAVENOUS | Status: AC
  Administered 2023-10-05: 150 mg via INTRAVENOUS
  Filled 2023-10-05: qty 60

## 2023-10-05 MED ORDER — SODIUM CHLORIDE 0.9 % IV SOLN
Freq: Once | INTRAVENOUS | Status: DC
Start: 2023-10-05 — End: 2023-10-05

## 2023-10-05 NOTE — Patient Instructions (Signed)
 [facility] - A DEPT OF [facility]  Discharge Instructions: Thank you for choosing [facility] to provide your oncology and hematology care.   If you have a lab appointment with the Cancer Center, please go directly to the Cancer Center and check in at the registration area.   Wear comfortable clothing and clothing appropriate for easy access to any Portacath or PICC line.   We strive to give you quality time with your provider. You may need to reschedule your appointment if you arrive late (15 or more minutes).  Arriving late affects you and other patients whose appointments are after yours.  Also, if you miss three or more appointments without notifying the office, you may be dismissed from the clinic at the provider's discretion.      For prescription refill requests, have your pharmacy contact our office and allow 72 hours for refills to be completed.    Today you received the following chemotherapy and/or immunotherapy agents Kyprolis      To help prevent nausea and vomiting after your treatment, we encourage you to take your nausea medication as directed.  BELOW ARE SYMPTOMS THAT SHOULD BE REPORTED IMMEDIATELY: *FEVER GREATER THAN 100.4 F (38 C) OR HIGHER *CHILLS OR SWEATING *NAUSEA AND VOMITING THAT IS NOT CONTROLLED WITH YOUR NAUSEA MEDICATION *UNUSUAL SHORTNESS OF BREATH *UNUSUAL BRUISING OR BLEEDING *URINARY PROBLEMS (pain or burning when urinating, or frequent urination) *BOWEL PROBLEMS (unusual diarrhea, constipation, pain near the anus) TENDERNESS IN MOUTH AND THROAT WITH OR WITHOUT PRESENCE OF ULCERS (sore throat, sores in mouth, or a toothache) UNUSUAL RASH, SWELLING OR PAIN  UNUSUAL VAGINAL DISCHARGE OR ITCHING   Items with * indicate a potential emergency and should be followed up as soon as possible or go to the Emergency Department if any problems should occur.  Please show the CHEMOTHERAPY ALERT CARD or IMMUNOTHERAPY  ALERT CARD at check-in to the Emergency Department and triage nurse.  Should you have questions after your visit or need to cancel or reschedule your appointment, please contact [facility] - A DEPT OF [facility]  Dept: [phone]  and follow the prompts.  Office hours are 8:00 a.m. to 4:30 p.m. Monday - Friday. Please note that voicemails left after 4:00 p.m. may not be returned until the following business day.  We are closed weekends and major holidays. You have access to a nurse at all times for urgent questions. Please call the main number to the clinic Dept: [phone] and follow the prompts.   For any non-urgent questions, you may also contact your provider using MyChart. We now offer e-Visits for anyone 49 and older to request care online for non-urgent symptoms. For details visit [email or web address].   Also download the MyChart app! Go to the app store, search "MyChart", open the app, select [facility] [place], and log in with your MyChart username and password.

## 2023-10-08 LAB — MULTIPLE MYELOMA PANEL, SERUM
Albumin SerPl Elph-Mcnc: 3.3 g/dL (ref 2.9–4.4)
Albumin/Glob SerPl: 1.4 (ref 0.7–1.7)
Alpha 1: 0.3 g/dL (ref 0.0–0.4)
Alpha2 Glob SerPl Elph-Mcnc: 0.8 g/dL (ref 0.4–1.0)
B-Globulin SerPl Elph-Mcnc: 1 g/dL (ref 0.7–1.3)
Gamma Glob SerPl Elph-Mcnc: 0.4 g/dL (ref 0.4–1.8)
Globulin, Total: 2.5 g/dL (ref 2.2–3.9)
IgA: 32 mg/dL — ABNORMAL LOW (ref 61–437)
IgG (Immunoglobin G), Serum: 525 mg/dL — ABNORMAL LOW (ref 603–1613)
IgM (Immunoglobulin M), Srm: 16 mg/dL — ABNORMAL LOW (ref 20–172)
M Protein SerPl Elph-Mcnc: 0.1 g/dL — ABNORMAL HIGH
Total Protein ELP: 5.8 g/dL — ABNORMAL LOW (ref 6.0–8.5)

## 2023-10-08 LAB — KAPPA/LAMBDA LIGHT CHAINS
Kappa free light chain: 4.8 mg/L (ref 3.3–19.4)
Kappa, lambda light chain ratio: 1.41 (ref 0.26–1.65)
Lambda free light chains: 3.4 mg/L — ABNORMAL LOW (ref 5.7–26.3)

## 2023-10-11 ENCOUNTER — Other Ambulatory Visit: Payer: Self-pay | Admitting: *Deleted

## 2023-10-11 DIAGNOSIS — C9002 Multiple myeloma in relapse: Secondary | ICD-10-CM

## 2023-10-12 ENCOUNTER — Inpatient Hospital Stay (HOSPITAL_BASED_OUTPATIENT_CLINIC_OR_DEPARTMENT_OTHER): Payer: Medicare (Managed Care) | Admitting: Hematology and Oncology

## 2023-10-12 ENCOUNTER — Inpatient Hospital Stay: Payer: Medicare (Managed Care)

## 2023-10-12 VITALS — BP 140/76 | HR 77 | Temp 97.0°F | Resp 14 | Wt 250.0 lb

## 2023-10-12 DIAGNOSIS — C9002 Multiple myeloma in relapse: Secondary | ICD-10-CM

## 2023-10-12 DIAGNOSIS — Z95828 Presence of other vascular implants and grafts: Secondary | ICD-10-CM

## 2023-10-12 DIAGNOSIS — Z5111 Encounter for antineoplastic chemotherapy: Secondary | ICD-10-CM | POA: Diagnosis not present

## 2023-10-12 DIAGNOSIS — D72825 Bandemia: Secondary | ICD-10-CM | POA: Diagnosis not present

## 2023-10-12 LAB — CMP (CANCER CENTER ONLY)
ALT: 24 U/L (ref 0–44)
AST: 14 U/L — ABNORMAL LOW (ref 15–41)
Albumin: 3.7 g/dL (ref 3.5–5.0)
Alkaline Phosphatase: 93 U/L (ref 38–126)
Anion gap: 8 (ref 5–15)
BUN: 13 mg/dL (ref 8–23)
CO2: 26 mmol/L (ref 22–32)
Calcium: 8.9 mg/dL (ref 8.9–10.3)
Chloride: 110 mmol/L (ref 98–111)
Creatinine: 0.68 mg/dL (ref 0.61–1.24)
GFR, Estimated: >60 mL/min (ref >60)
Glucose, Bld: 118 mg/dL — ABNORMAL HIGH (ref 70–99)
Potassium: 3.3 mmol/L — ABNORMAL LOW (ref 3.5–5.1)
Sodium: 144 mmol/L (ref 135–145)
Total Bilirubin: 0.9 mg/dL (ref 0.0–1.2)
Total Protein: 6.1 g/dL — ABNORMAL LOW (ref 6.5–8.1)

## 2023-10-12 LAB — CBC WITH DIFFERENTIAL (CANCER CENTER ONLY)
Abs Immature Granulocytes: 0.03 10*3/uL (ref 0.00–0.07)
Basophils Absolute: 0 10*3/uL (ref 0.0–0.1)
Basophils Relative: 0 %
Eosinophils Absolute: 0.3 10*3/uL (ref 0.0–0.5)
Eosinophils Relative: 2 %
HCT: 32.6 % — ABNORMAL LOW (ref 39.0–52.0)
Hemoglobin: 11.1 g/dL — ABNORMAL LOW (ref 13.0–17.0)
Immature Granulocytes: 0 %
Lymphocytes Relative: 8 %
Lymphs Abs: 0.9 10*3/uL (ref 0.7–4.0)
MCH: 28.7 pg (ref 26.0–34.0)
MCHC: 34 g/dL (ref 30.0–36.0)
MCV: 84.2 fL (ref 80.0–100.0)
Monocytes Absolute: 1 10*3/uL (ref 0.1–1.0)
Monocytes Relative: 9 %
Neutro Abs: 9.2 10*3/uL — ABNORMAL HIGH (ref 1.7–7.7)
Neutrophils Relative %: 81 %
Platelet Count: 172 10*3/uL (ref 150–400)
RBC: 3.87 MIL/uL — ABNORMAL LOW (ref 4.22–5.81)
RDW: 17.8 % — ABNORMAL HIGH (ref 11.5–15.5)
WBC Count: 11.4 10*3/uL — ABNORMAL HIGH (ref 4.0–10.5)
nRBC: 0 % (ref 0.0–0.2)

## 2023-10-12 LAB — LACTATE DEHYDROGENASE: LDH: 151 U/L (ref 98–192)

## 2023-10-12 MED ORDER — AZITHROMYCIN 250 MG PO TABS: ORAL_TABLET | ORAL | 0 refills | Status: DC

## 2023-10-12 MED ORDER — DEXTROSE 5 % IV SOLN
70.0000 mg/m2 | Freq: Once | INTRAVENOUS | Status: AC
  Administered 2023-10-12: 150 mg via INTRAVENOUS
  Filled 2023-10-12: qty 60

## 2023-10-12 MED ORDER — SODIUM CHLORIDE 0.9 % IV SOLN
40.0000 mg | Freq: Once | INTRAVENOUS | Status: AC
  Administered 2023-10-12: 40 mg via INTRAVENOUS
  Filled 2023-10-12: qty 4

## 2023-10-12 MED ORDER — SODIUM CHLORIDE 0.9 % IV SOLN: Freq: Once | INTRAVENOUS | Status: AC

## 2023-10-12 NOTE — Patient Instructions (Signed)
 [facility] - A DEPT OF [facility]  Discharge Instructions: Thank you for choosing [facility] to provide your oncology and hematology care.   If you have a lab appointment with the Cancer Center, please go directly to the Cancer Center and check in at the registration area.   Wear comfortable clothing and clothing appropriate for easy access to any Portacath or PICC line.   We strive to give you quality time with your provider. You may need to reschedule your appointment if you arrive late (15 or more minutes).  Arriving late affects you and other patients whose appointments are after yours.  Also, if you miss three or more appointments without notifying the office, you may be dismissed from the clinic at the provider's discretion.      For prescription refill requests, have your pharmacy contact our office and allow 72 hours for refills to be completed.    Today you received the following chemotherapy and/or immunotherapy agents Kyprolis      To help prevent nausea and vomiting after your treatment, we encourage you to take your nausea medication as directed.  BELOW ARE SYMPTOMS THAT SHOULD BE REPORTED IMMEDIATELY: *FEVER GREATER THAN 100.4 F (38 C) OR HIGHER *CHILLS OR SWEATING *NAUSEA AND VOMITING THAT IS NOT CONTROLLED WITH YOUR NAUSEA MEDICATION *UNUSUAL SHORTNESS OF BREATH *UNUSUAL BRUISING OR BLEEDING *URINARY PROBLEMS (pain or burning when urinating, or frequent urination) *BOWEL PROBLEMS (unusual diarrhea, constipation, pain near the anus) TENDERNESS IN MOUTH AND THROAT WITH OR WITHOUT PRESENCE OF ULCERS (sore throat, sores in mouth, or a toothache) UNUSUAL RASH, SWELLING OR PAIN  UNUSUAL VAGINAL DISCHARGE OR ITCHING   Items with * indicate a potential emergency and should be followed up as soon as possible or go to the Emergency Department if any problems should occur.  Please show the CHEMOTHERAPY ALERT CARD or IMMUNOTHERAPY  ALERT CARD at check-in to the Emergency Department and triage nurse.  Should you have questions after your visit or need to cancel or reschedule your appointment, please contact [facility] - A DEPT OF [facility]  Dept: [phone]  and follow the prompts.  Office hours are 8:00 a.m. to 4:30 p.m. Monday - Friday. Please note that voicemails left after 4:00 p.m. may not be returned until the following business day.  We are closed weekends and major holidays. You have access to a nurse at all times for urgent questions. Please call the main number to the clinic Dept: [phone] and follow the prompts.   For any non-urgent questions, you may also contact your provider using MyChart. We now offer e-Visits for anyone 49 and older to request care online for non-urgent symptoms. For details visit [email or web address].   Also download the MyChart app! Go to the app store, search "MyChart", open the app, select [facility] [place], and log in with your MyChart username and password.

## 2023-10-12 NOTE — Progress Notes (Signed)
 [name] [phone]   [phone]  PROGRESS NOTE  Patient Care Team: Gary Bastos, MD as PCP - General (Family Medicine) Gary Oyster, MD as Consulting Physician (Physical Medicine and Rehabilitation) Gary Alamin Mcarthur Rossetti, PA-C as Physician Assistant (Physical Medicine and Rehabilitation) Gary Standard, MD as Consulting Physician (Hematology and Oncology) Gary Standard, MD as Consulting Physician (Hematology and Oncology) Gary Standard, MD as Consulting Physician (Hematology and Oncology)  Hematological/Oncological History # IgG Lambda Multiple Myeloma, Relapsed (ISS Stage II) 1) 06/2010: initial diagnosis of Multiple Myeloma after T8 compression fracture. Treated with Velcade/Revlimid/Dexamethasone and achieved a complete remission 2) Velcade was discontinued in September 2012 and that Revlimid and Decadron were discontinued in March 2013. 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 4) Followed by Dr. Bertis [facility], last clinic visit 10/09/2019. At that time there was concern for relapse of his multiple myeloma.  5) Patient requested transfer to different provider after misunderstanding regarding imaging studies 6) 12/17/2019: transfer care to Dr. Leonides [facility]  7) 01/09/2020: Cycle 1 Day 1 of Dara/Velcade/Dex 8) 01/21/2020: presented as urgent visit for diarrhea and dehydration. Holding chemotherapy scheduled for 01/23/2020. 9) 01/30/2020: Resume dara/velcade/dex after resolution of diarrhea.  10) 02/13/2020: restaging labs show M protein 0.8, Kappa 4.5, lamba 17.2, ratio 0.26, urine M protein 53 (7.1%). All MM labs indicate improvement.  11) 03/10/2020: Cycle 4 Day 1 of Dara/Velcade/Dex. Transition to q 3 week daratumumab.  12) 06/04/2020:  Cycle 8 Day 1 of Dara/Velcade/Dex 13) 06/25/2020:  Cycle 9 Day 1 of Dara/Velcade/Dex 14) 07/22/2020: Cycle 10 Day 1  of  Dara//Dex 15) 08/18/2020: Cycle 11 Day 1 of Dara//Dex 16) 09/23/2020: Cycle 12 Day 1 of Dara//Dex 17) 10/22/2020: Cycle 13 Day 1 of Dara/Dex  18) 11/19/2020: Cycle 14 Day 1 of Dara/Dex  19) 12/17/2020: Cycle 15 Day 1 of Dara/Dex  20) 01/17/2021: Cycle 16 Day 1 of Dara/Dex  21) 02/11/2021: Cycle 17 Day 1 of Dara/Dex 22) 03/10/2021: Cycle 18 Day 1 of Dara/Dex  23) 04/08/2021: Cycle 19 Day 1 of Dara/Dex  24) 08/03/2021: Cycle 20 Day 1 of Dara/Dex (delayed due to scheduling error) 25) 09/02/2021: Cycle 21 Day 1 of Dara/Dex 26) 09/30/2021: Cycle 22 Day 1 of Dara/Dex 27) 10/28/2021: Cycle 23 Day 1 of Dara/Dex 28) 12/02/2021: Cycle 24 Day 1 of Dara/Dex 29) 12/30/2021: Cycle 25 Day 1 of Dara/Dex 30) 01/06/2022: Cycle 1 Day 1 of Kyprolis/Dex 31) 02/03/2022: Cycle 2 Day 1 of Kyprolis/Dex 32) 02/24/2022: Cycle 3 Day 1 of Kyprolis/Dex 33) 04/07/2022: Cycle 4 Day 1 of Kyprolis/Dex 34) 05/05/2022: Cycle 5 Day 1 of Kyprolis/Dex 35) 06/01/2022: Cycle 6 Day 1 of Kyprolis/Dex 36) 06/29/2022: Cycle 7 Day 1 of Kyprolis/Dex 37) 07/28/2022: Cycle 8 Day 1 of Kyprolis/Dex 38) 08/25/2022: Cycle 9 Day 1 of Kyprolis/Dex 39) 09/22/2022: Cycle 10 Day 1 of Kyprolis/Dex 40) 10/20/2022: Cycle 11 Day 1 of Kyprolis/Dex 41) 11/17/2022: Cycle 12 Day 1 of Kyprolis/Dex 42 ) 12/22/2022-01/08/2023: Admitted for fournier's gangrene of scrotum.  43) 01/26/2023: Cycle 13 Day 1 of Kyprolis/Dex 44) 02/16/2023: Cycle 14 Day 1 of Kyprolis/Dex 45) 03/16/2023:Cycle 15 Day 1 of Kyprolis/Dex 46) 04/13/2023: Cycle 16 Day 1 of Kyprolis/Dex 47) 05/11/2023: Cycle 17 Day 1 of Kyprolis/Dex 48) 06/07/2023: Cycle 18 Day 1 of Kyprolis/Dex 49) 07/06/2023: Cycle 19 Day 1 of Kyprolis/Dex 50) 08/02/2023: Cycle 20 Day 1 of Kyprolis/Dex 51) 08/31/2023: Cycle 21 Day 1 of  Kyprolis/Dex 52) 09/28/2023: Cycle 22 Day 1 of Kyprolis/Dex  Interval History:  [name] 68 y.o. male with medical history significant for  IgG Lambda Multiple Myeloma who presents for a follow up  visit. The patient's last visit was on 09/28/2023.  He presents today prior to treatment with Kyprolis/Dex. He is unaccompanied for this visit.   On exam today [name] reports today he is feeling under the weather.  He is having some runny nose, sneezing and cough.  He thinks it may be due to allergies.  He is not having any headache or fevers, chills, sweats.  He reports that he has been given cough syrup.  He notes that he has been gaining some weight and is surprised because he felt like his appetite is reduced.  He notes his energy is so-so today because of his runny nose.  He is tolerating Kyprolis well with no major side effects.  Is not having any numbness or tingling of the fingers and toes and denies any nausea, vomiting, diarrhea.  He reports that he has been somewhat gassy with no other GI upset.Marland Kitchen He denies fevers, chills, night sweats, shortness of breath, chest pain or cough.  He has no other complaints.  Rest of the 10 point ROS is below.  MEDICAL HISTORY:  Past Medical History:  Diagnosis Date   Adrenal insufficiency (HCC)    on chronic dexamethasone   Anemia    Cancer (HCC)    Coagulopathy (HCC)    on xeralto/ s/p DVT while on coumadin,  IVC in place   Diabetes mellitus without complication (HCC)    type 2   Glaucoma 12/22/2022   Gross hematuria 01/2013   post foley cath procedure   History of blood transfusion 01/2013   Lower GI bleeding 04/18/2017   Multiple myeloma    thoracic T8 with paraplegia s/p resection- on chemo at visit 10/13/10   Multiple myeloma    Multiple myeloma without mention of remission    Neurogenic bladder    Neurogenic bowel    OSA (obstructive sleep apnea) 11/01/2022   Paraplegia (HCC)    Partial small bowel obstruction (HCC) during dec 2011 admission    SURGICAL HISTORY: Past Surgical History:  Procedure Laterality Date   COLONOSCOPY WITH PROPOFOL N/A 04/12/2017   Procedure: COLONOSCOPY WITH PROPOFOL;  Surgeon: Gary Fredrickson, MD;  Location: [facility]  ENDOSCOPY;  Service: Endoscopy;  Laterality: N/A;   COLONOSCOPY WITH PROPOFOL N/A 04/19/2017   Procedure: COLONOSCOPY WITH PROPOFOL;  Surgeon: Gary Deeds, MD;  Location: [facility] ENDOSCOPY;  Service: Gastroenterology;  Laterality: N/A;   COLOSTOMY  07/20/2011   Procedure: COLOSTOMY;  Surgeon: Rulon Abide, DO;  Location: [facility];  Service: General;;   COLOSTOMY REVISION  07/20/2011   Procedure: COLON RESECTION SIGMOID;  Surgeon: Rulon Abide, DO;  Location: [facility];  Service: General;;   CYSTOSCOPY N/A 04/04/2013   Procedure: CYSTOSCOPY WITH LITHALOPAXY;  Surgeon: Sebastian Ache, MD;  Location: [facility] ORS;  Service: Urology;  Laterality: N/A;   INCISION AND DRAINAGE ABSCESS N/A 12/25/2022   Procedure: INCISION AND DRAINAGE OF SCROTUM;  Surgeon: Despina Arias, MD;  Location: [facility];  Service: Urology;  Laterality: N/A;   INSERTION OF SUPRAPUBIC CATHETER N/A 04/04/2013   Procedure: INSERTION OF SUPRAPUBIC CATHETER;  Surgeon: Sebastian Ache, MD;  Location: [facility];  Service: Urology;  Laterality: N/A;   LAPAROTOMY  07/20/2011   Procedure: EXPLORATORY LAPAROTOMY;  Surgeon: Rulon Abide, DO;  [facility];  Service: General;  Laterality: N/A;   myeloma thoracic T8 with parpaplegia s/p thoracotomy and thoracic T7-9 cage placement on Dec 26th 2011  07/18/10   SCROTAL EXPLORATION N/A 12/29/2022   Procedure: SCROTAL WOUND DEBRIDEMENT AND CLOSURE;  Surgeon: Loletta Parish., MD;  Location: [facility];  Service: Urology;  Laterality: N/A;    SOCIAL HISTORY: Social History   Socioeconomic History   Marital status: Married    Spouse name: Not on file   Number of children: Not on file   Years of education: Not on file   Highest education level: Not on file  Occupational History   Not on file  Tobacco Use   Smoking status: Never   Smokeless tobacco: Never  Vaping Use   Vaping status: Never Used  Substance and Sexual Activity   Alcohol use: No   Drug use: No   Sexual activity:  Never  Other Topics Concern   Not on file  Social History Narrative   Not on file   Social Drivers of Health   Financial Resource Strain: Not on file  Food Insecurity: No Food Insecurity (12/22/2022)   Hunger Vital Sign    Worried About Running Out of Food in the Last Year: Never true    Ran Out of Food in the Last Year: Never true  Transportation Needs: No Transportation Needs (12/22/2022)   PRAPARE - Administrator, Civil Service (Medical): No    Lack of Transportation (Non-Medical): No  Physical Activity: Not on file  Stress: Not on file  Social Connections: Not on file  Intimate Partner Violence: Not At Risk (12/22/2022)   Humiliation, Afraid, Rape, and Kick questionnaire    Fear of Current or Ex-Partner: No    Emotionally Abused: No    Physically Abused: No    Sexually Abused: No    FAMILY HISTORY: Family History  Problem Relation Age of Onset   Ovarian cancer Mother    Diabetes Father     ALLERGIES:  is allergic to ferumoxytol.  MEDICATIONS:  Current Outpatient Medications  Medication Sig Dispense Refill   azithromycin (ZITHROMAX Z-PAK) 250 MG tablet Take 2 pills on Day 1 and 1 pill every day thereafter for a total of 5 days. 6 each 0   guaifenesin (ROBITUSSIN) 100 MG/5ML syrup Take 200 mg by mouth 4 (four) times daily as needed for cough.     acetaminophen (TYLENOL) 325 MG tablet Take 325-650 mg by mouth every 6 (six) hours as needed for headache or mild pain.     acetic acid 0.25 % irrigation Irrigate with 1 Application as directed See admin instructions. Instill 50 ml's, clamp for 10 minutes, then remove clamp and drain the bladder. Repeat 3 times a week- when not using the Renacidin irrigation     acyclovir (ZOVIRAX) 400 MG tablet Take 1 tablet (400 mg total) by mouth 2 (two) times daily. 60 tablet 2   albuterol (VENTOLIN HFA) 108 (90 Base) MCG/ACT inhaler Inhale 2 puffs into the lungs every 4 (four) hours as needed for wheezing or shortness of breath.      atorvastatin (LIPITOR) 20 MG tablet Take 20 mg by mouth daily.     BENADRYL ALLERGY 25 MG tablet Take 25 mg by mouth every 6 (six) hours as needed (for allergic reactions).     BIOFREEZE 4 % GEL Apply 1 application  topically every 4 (four) hours as needed (for joint pain- to intact areas of the skin only).  Citric Ac-Gluconolact-Mg Carb (RENACIDIN IR) Irrigate with 30 mLs as directed See admin instructions. Instill 30 ml's, clamp for 10 minutes, then remove clamp and drain the bladder. Repeat 3 times a week- when not using the acetic acid irrigation     COSOPT PF 2-0.5 % SOLN Place 1 drop into both eyes 2 (two) times daily.     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     Emollient (EUCERIN EX) Apply 1 application  topically See admin instructions. Apply to affected area 2 times a day     FLOVENT HFA 110 MCG/ACT inhaler 2 each See admin instructions. 2 sprays to colostomy stoma prior to each colostomy bag change     iron polysaccharides (NU-IRON) 150 MG capsule Take 1 capsule (150 mg total) by mouth daily. 30 capsule 0   latanoprost (XALATAN) 0.005 % ophthalmic solution Place 1 drop into both eyes at bedtime.     Multiple Vitamin (MULTIVITAMIN WITH MINERALS) TABS tablet Take 1 tablet by mouth daily. 30 tablet 0   NON FORMULARY Apply 1 application  topically See admin instructions. Colo Plast paste- Apply as directed with each ostomy bag change     ondansetron (ZOFRAN) 4 MG tablet Take 1 tablet (4 mg) by mouth every 6 hours as needed for nausea. 20 tablet 0   rivaroxaban (XARELTO) 10 MG TABS tablet Take 1 tablet (10 mg total) by mouth daily with supper. (Patient taking differently: Take 10 mg by mouth daily.) 30 tablet 9   Skin Protectants, Misc. (MINERIN CREME) CREA Apply to affected areas 2 times daily as needed. 454 g 0   Zinc Oxide (BALMEX EX) Apply 1 application  topically See admin instructions. Apply  topically daily to affected sites     No current facility-administered medications for this visit.   Facility-Administered Medications Ordered in Other Visits  Medication Dose Route Frequency Provider Last Rate Last Admin   sodium chloride flush (NS) 0.9 % injection 10 mL  10 mL Intravenous PRN [name], Ni, MD        REVIEW OF SYSTEMS:   Constitutional: ( - ) fevers, ( - )  chills , ( - ) night sweats Eyes: ( - ) blurriness of vision, ( - ) double vision, ( - ) watery eyes Ears, nose, mouth, throat, and face: ( - ) mucositis, ( - ) sore throat Respiratory: ( - ) cough, ( - ) dyspnea, ( - ) wheezes Cardiovascular: ( - ) palpitation, ( - ) chest discomfort, ( - ) lower extremity swelling Gastrointestinal:  ( - ) nausea, ( - ) heartburn, ( - ) change in bowel habits Skin: ( - ) abnormal skin rashes Lymphatics: ( - ) new lymphadenopathy, ( - ) easy bruising Neurological: ( - ) numbness, ( - ) tingling, ( - ) new weaknesses Behavioral/Psych: ( - ) mood change, ( - ) new changes  All other systems were reviewed with the patient and are negative.  PHYSICAL EXAMINATION: ECOG PERFORMANCE STATUS: paraplegic.   Vitals:   10/12/23 1039  BP: (!) 140/76  Pulse: 77  Resp: 14  Temp: (!) 97 F (36.1 C)  SpO2: 100%       Filed Weights   10/12/23 1039  Weight: 250 lb (113.4 kg)     GENERAL: well appearing middle aged [place] American male alert, no distress and comfortable SKIN: skin color, texture, turgor are normal, no rashes  or significant lesions EYES: conjunctiva are pink and non-injected, sclera clear LUNGS: clear to auscultation and percussion with normal breathing effort HEART: regular rate & rhythm and no murmurs Musculoskeletal: no cyanosis of digits and no clubbing  PSYCH: alert & oriented x 3, fluent speech NEURO: paraplegic, no use of LE bilaterally.   LABORATORY DATA:  I have reviewed the data as listed    Latest Ref Rng & Units 10/12/2023    9:48 AM 10/05/2023     9:21 AM 09/28/2023   10:03 AM  CBC  WBC 4.0 - 10.5 K/uL 11.4  8.8  7.2   Hemoglobin 13.0 - 17.0 g/dL 69.6  29.5  28.4   Hematocrit 39.0 - 52.0 % 32.6  33.9  34.6   Platelets 150 - 400 K/uL 172  185  376        Latest Ref Rng & Units 10/12/2023    9:48 AM 10/05/2023    9:21 AM 09/28/2023   10:03 AM  CMP  Glucose 70 - 99 mg/dL 132  97  440   BUN 8 - 23 mg/dL 13  16  14    Creatinine 0.61 - 1.24 mg/dL 1.02  7.25  3.66   Sodium 135 - 145 mmol/L 144  144  144   Potassium 3.5 - 5.1 mmol/L 3.3  3.6  4.1   Chloride 98 - 111 mmol/L 110  110  111   CO2 22 - 32 mmol/L 26  27  28    Calcium 8.9 - 10.3 mg/dL 8.9  9.2  9.0   Total Protein 6.5 - 8.1 g/dL 6.1  6.4  6.3   Total Bilirubin 0.0 - 1.2 mg/dL 0.9  0.7  0.5   Alkaline Phos 38 - 126 U/L 93  92  96   AST 15 - 41 U/L 14  12  14    ALT 0 - 44 U/L 24  17  16      Lab Results  Component Value Date   MPROTEIN 0.1 (H) 10/05/2023   MPROTEIN Not Observed 08/31/2023   MPROTEIN Not Observed 08/02/2023   Lab Results  Component Value Date   KPAFRELGTCHN 4.8 10/05/2023   KPAFRELGTCHN 11.2 08/31/2023   KPAFRELGTCHN 7.2 08/02/2023   LAMBDASER 3.4 (L) 10/05/2023   LAMBDASER 5.5 (L) 08/31/2023   LAMBDASER 5.5 (L) 08/02/2023   KAPLAMBRATIO 1.41 10/05/2023   KAPLAMBRATIO 2.04 (H) 08/31/2023   KAPLAMBRATIO 1.31 08/02/2023    RADIOGRAPHIC STUDIES: No results found.   ASSESSMENT & PLAN MAKARI PORTMAN is a 68 y.o.  male with medical history significant for  IgG Lambda Multiple Myeloma who presents for a follow up visit.  # IgG Lambda Multiple Myeloma, Relapsed (ISS Stage II) --findings are most consistent with relapsed multiple myeloma. Patient previously successfully treated with Velcade/Rev/Dex and Daratumumab/Velcade/Dex. On 07/02/2020 he transitioned to monthly daratumumab alone.  --due to to rise in M protein, switched to Kyprolis and Dexamethasone on 01/06/2022 Plan: --Due for Cycle 22, Day 15 of Kyprolis/Dex today  --Labs show white blood  cell count 11.4, hemoglobin 1.1, MCV 84.2, platelets 172. Creatinine and LFTs are in range.  --Most recent myeloma labs from 08/31/2023 showed M protein 0.1 with kappa 4.8, lambda 3.4, ratio 1.41.  --Proceed with treatment today without any dose modifications. --Continue with weekly labs plus Kyprolis/Dex treatment as scheduled  #Cold Symptoms -- Patient has runny nose, sneezing, and cough with no fevers, chills, sweats -- Called in Z-Pak to his pharmacy  # Memory loss: --Encouraged to do memory  exercises including puzzles, suduko, etc --If symptoms worsen, we can make a referral to neurology for further evaluation.   #History of Sepsis 2/2 UTI and Fournier's gangrene of scrotum: --Hospitalized from 12/22/2022-01/08/2023. Underwent debridement of necrotic tissue. --Given PIP tazobactam and Zyvox initially --Urine culture reveals Proteus mirabilis which is resistant to ciprofloxacin and nitrofurantoin --Blood cultures negative --D/C on remainder doxycycline and Augmentin which he completed on 01/07/2023.  --Evaluated by urology on 7/2 and ID on 7/3 with continued wound healing and no further d/c or evidence of infection.   #History of DVT --He had placement of IVC filter, remains on Xarelto. --Due to poor mobility, and lack of bleeding complications, recommend to continue on Xarelto indefinitely. --caution if Plt count were to drop <50  #Snoring/Risk for OSA: --Diagnosed with OSA and has CPAP machine.   # Supportive Care -- provided patient with an albuterol inhaler (for use with daratumumab) --acyclovir 400mg  BID for VZV prophylaxis --zofran 8mg  q8H PRN for nausea/vomiting  --Zometa is being held in the setting of his prior episode of osteonecrosis of the jaw  No orders of the defined types were placed in this encounter.   All questions were answered. The patient knows to call the clinic with any problems, questions or concerns.  I have spent a total of 30 minutes minutes of  face-to-face and non-face-to-face time, preparing to see the patient,  performing a medically appropriate examination, counseling and educating the patient, communicating with other health care professionals, documenting clinical information in the electronic health record,  and care coordination.   Ulysees Barns, MD Department of Hematology/Oncology [facility] Phone: [phone] Pager: [phone] Email: [email or web address]     10/12/2023 2:47 PM   Literature Support:  Milagros Loll, Petrucci MT, [place], [place], [place], [place], Spada S, Mc[place], Ponticelli E, [place], Cavo M, Di Toritto TC, Haydee Salter F, [place] V, Palumbo A, Boccadoro M, Larocca A. Once-weekly versus twice-weekly carfilzomib in patients with newly diagnosed multiple myeloma: a pooled analysis of two phase I/II studies. Haematologica. 2019 Aug;104(8):1640-1647.  --Once-weekly 70 mg/m2 carfilzomib as induction and maintenance therapy for newly diagnosed multiple myeloma patients was as safe and effective as twice-weekly 36 mg/m2 carfilzomib and provided a more convenient schedule.

## 2023-10-17 ENCOUNTER — Other Ambulatory Visit: Payer: Self-pay

## 2023-10-25 ENCOUNTER — Other Ambulatory Visit: Payer: Self-pay | Admitting: Vascular Surgery

## 2023-10-25 ENCOUNTER — Ambulatory Visit
Admission: RE | Admit: 2023-10-25 | Discharge: 2023-10-25 | Disposition: A | Discharge status: Home / Self Care | Payer: Medicare (Managed Care) | Source: Ambulatory Visit | Attending: Vascular Surgery | Admitting: Vascular Surgery

## 2023-10-25 DIAGNOSIS — R052 Subacute cough: Secondary | ICD-10-CM

## 2023-10-26 ENCOUNTER — Other Ambulatory Visit: Payer: Self-pay

## 2023-10-26 ENCOUNTER — Inpatient Hospital Stay: Payer: Medicare (Managed Care) | Admitting: Physician Assistant

## 2023-10-26 ENCOUNTER — Other Ambulatory Visit: Payer: Self-pay | Admitting: Hematology

## 2023-10-26 ENCOUNTER — Inpatient Hospital Stay: Payer: Medicare (Managed Care) | Attending: Physician Assistant

## 2023-10-26 ENCOUNTER — Inpatient Hospital Stay: Payer: Medicare (Managed Care)

## 2023-10-26 VITALS — BP 117/74 | HR 89 | Temp 97.3°F | Resp 16 | Wt 243.7 lb

## 2023-10-26 DIAGNOSIS — D649 Anemia, unspecified: Secondary | ICD-10-CM | POA: Insufficient documentation

## 2023-10-26 DIAGNOSIS — Z86718 Personal history of other venous thrombosis and embolism: Secondary | ICD-10-CM | POA: Insufficient documentation

## 2023-10-26 DIAGNOSIS — D72825 Bandemia: Secondary | ICD-10-CM

## 2023-10-26 DIAGNOSIS — Z5111 Encounter for antineoplastic chemotherapy: Secondary | ICD-10-CM

## 2023-10-26 DIAGNOSIS — C9002 Multiple myeloma in relapse: Secondary | ICD-10-CM

## 2023-10-26 DIAGNOSIS — R413 Other amnesia: Secondary | ICD-10-CM | POA: Diagnosis not present

## 2023-10-26 DIAGNOSIS — G4733 Obstructive sleep apnea (adult) (pediatric): Secondary | ICD-10-CM | POA: Diagnosis not present

## 2023-10-26 LAB — CBC WITH DIFFERENTIAL (CANCER CENTER ONLY)
Abs Immature Granulocytes: 0.04 K/uL (ref 0.00–0.07)
Basophils Absolute: 0 K/uL (ref 0.0–0.1)
Basophils Relative: 0 %
Eosinophils Absolute: 0.3 K/uL (ref 0.0–0.5)
Eosinophils Relative: 4 %
HCT: 32 % — ABNORMAL LOW (ref 39.0–52.0)
Hemoglobin: 10.9 g/dL — ABNORMAL LOW (ref 13.0–17.0)
Immature Granulocytes: 0 %
Lymphocytes Relative: 8 %
Lymphs Abs: 0.7 K/uL (ref 0.7–4.0)
MCH: 28.2 pg (ref 26.0–34.0)
MCHC: 34.1 g/dL (ref 30.0–36.0)
MCV: 82.9 fL (ref 80.0–100.0)
Monocytes Absolute: 0.6 K/uL (ref 0.1–1.0)
Monocytes Relative: 7 %
Neutro Abs: 7.3 K/uL (ref 1.7–7.7)
Neutrophils Relative %: 81 %
Platelet Count: 404 K/uL — ABNORMAL HIGH (ref 150–400)
RBC: 3.86 MIL/uL — ABNORMAL LOW (ref 4.22–5.81)
RDW: 17.6 % — ABNORMAL HIGH (ref 11.5–15.5)
WBC Count: 9 K/uL (ref 4.0–10.5)
nRBC: 0 % (ref 0.0–0.2)

## 2023-10-26 LAB — CMP (CANCER CENTER ONLY)
ALT: 20 U/L (ref 0–44)
AST: 18 U/L (ref 15–41)
Albumin: 3.8 g/dL (ref 3.5–5.0)
Alkaline Phosphatase: 105 U/L (ref 38–126)
Anion gap: 7 (ref 5–15)
BUN: 7 mg/dL — ABNORMAL LOW (ref 8–23)
CO2: 23 mmol/L (ref 22–32)
Calcium: 9.1 mg/dL (ref 8.9–10.3)
Chloride: 111 mmol/L (ref 98–111)
Creatinine: 0.7 mg/dL (ref 0.61–1.24)
GFR, Estimated: >60 mL/min (ref >60)
Glucose, Bld: 123 mg/dL — ABNORMAL HIGH (ref 70–99)
Potassium: 3.5 mmol/L (ref 3.5–5.1)
Sodium: 141 mmol/L (ref 135–145)
Total Bilirubin: 0.6 mg/dL (ref 0.0–1.2)
Total Protein: 6.4 g/dL — ABNORMAL LOW (ref 6.5–8.1)

## 2023-10-26 LAB — IRON AND IRON BINDING CAPACITY (CC-WL,HP ONLY)
Iron: 41 ug/dL — ABNORMAL LOW (ref 45–182)
Saturation Ratios: 16 % — ABNORMAL LOW (ref 17.9–39.5)
TIBC: 265 ug/dL (ref 250–450)
UIBC: 224 ug/dL (ref 117–376)

## 2023-10-26 MED ORDER — SODIUM CHLORIDE 0.9 % IV SOLN: Freq: Once | INTRAVENOUS | Status: AC

## 2023-10-26 MED ORDER — SODIUM CHLORIDE 0.9 % IV SOLN
40.0000 mg | Freq: Once | INTRAVENOUS | Status: AC
  Administered 2023-10-26: 40 mg via INTRAVENOUS
  Filled 2023-10-26: qty 4

## 2023-10-26 MED ORDER — SODIUM CHLORIDE 0.9 % IV SOLN: Freq: Once | INTRAVENOUS | Status: DC

## 2023-10-26 MED ORDER — DEXTROSE 5 % IV SOLN
70.0000 mg/m2 | Freq: Once | INTRAVENOUS | Status: AC
  Administered 2023-10-26: 150 mg via INTRAVENOUS
  Filled 2023-10-26: qty 60

## 2023-10-26 NOTE — Progress Notes (Signed)
 [facility] [phone]   [phone]  PROGRESS NOTE  Patient Care Team: Jethro Bastos, MD as PCP - General (Family Medicine) Ranelle Oyster, MD as Consulting Physician (Physical Medicine and Rehabilitation) Hildred Alamin Mcarthur Rossetti, PA-C as Physician Assistant (Physical Medicine and Rehabilitation) Jaci Standard, MD as Consulting Physician (Hematology and Oncology) Jaci Standard, MD as Consulting Physician (Hematology and Oncology) Jaci Standard, MD as Consulting Physician (Hematology and Oncology)  Hematological/Oncological History # IgG Lambda Multiple Myeloma, Relapsed (ISS Stage II) 1) 06/2010: initial diagnosis of Multiple Myeloma after T8 compression fracture. Treated with Velcade/Revlimid/Dexamethasone and achieved a complete remission 2) Velcade was discontinued in September 2012 and that Revlimid and Decadron were discontinued in March 2013. 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 4) Followed by Dr. Bertis Ruddy, last clinic visit 10/09/2019. At that time there was concern for relapse of his multiple myeloma.  5) Patient requested transfer to different provider after misunderstanding regarding imaging studies 6) 12/17/2019: transfer care to Dr. Leonides Schanz  7) 01/09/2020: Cycle 1 Day 1 of Dara/Velcade/Dex 8) 01/21/2020: presented as urgent visit for diarrhea and dehydration. Holding chemotherapy scheduled for 01/23/2020. 9) 01/30/2020: Resume dara/velcade/dex after resolution of diarrhea.  10) 02/13/2020: restaging labs show M protein 0.8, Kappa 4.5, lamba 17.2, ratio 0.26, urine M protein 53 (7.1%). All MM labs indicate improvement.  11) 03/10/2020: Cycle 4 Day 1 of Dara/Velcade/Dex. Transition to q 3 week daratumumab.  12) 06/04/2020:  Cycle 8 Day 1 of Dara/Velcade/Dex 13) 06/25/2020:  Cycle 9 Day 1 of Dara/Velcade/Dex 14) 07/22/2020: Cycle 10 Day 1  of  Dara//Dex 15) 08/18/2020: Cycle 11 Day 1 of Dara//Dex 16) 09/23/2020: Cycle 12 Day 1 of Dara//Dex 17) 10/22/2020: Cycle 13 Day 1 of Dara/Dex  18) 11/19/2020: Cycle 14 Day 1 of Dara/Dex  19) 12/17/2020: Cycle 15 Day 1 of Dara/Dex  20) 01/17/2021: Cycle 16 Day 1 of Dara/Dex  21) 02/11/2021: Cycle 17 Day 1 of Dara/Dex 22) 03/10/2021: Cycle 18 Day 1 of Dara/Dex  23) 04/08/2021: Cycle 19 Day 1 of Dara/Dex  24) 08/03/2021: Cycle 20 Day 1 of Dara/Dex (delayed due to scheduling error) 25) 09/02/2021: Cycle 21 Day 1 of Dara/Dex 26) 09/30/2021: Cycle 22 Day 1 of Dara/Dex 27) 10/28/2021: Cycle 23 Day 1 of Dara/Dex 28) 12/02/2021: Cycle 24 Day 1 of Dara/Dex 29) 12/30/2021: Cycle 25 Day 1 of Dara/Dex 30) 01/06/2022: Cycle 1 Day 1 of Kyprolis/Dex 31) 02/03/2022: Cycle 2 Day 1 of Kyprolis/Dex 32) 02/24/2022: Cycle 3 Day 1 of Kyprolis/Dex 33) 04/07/2022: Cycle 4 Day 1 of Kyprolis/Dex 34) 05/05/2022: Cycle 5 Day 1 of Kyprolis/Dex 35) 06/01/2022: Cycle 6 Day 1 of Kyprolis/Dex 36) 06/29/2022: Cycle 7 Day 1 of Kyprolis/Dex 37) 07/28/2022: Cycle 8 Day 1 of Kyprolis/Dex 38) 08/25/2022: Cycle 9 Day 1 of Kyprolis/Dex 39) 09/22/2022: Cycle 10 Day 1 of Kyprolis/Dex 40) 10/20/2022: Cycle 11 Day 1 of Kyprolis/Dex 41) 11/17/2022: Cycle 12 Day 1 of Kyprolis/Dex 42 ) 12/22/2022-01/08/2023: Admitted for fournier's gangrene of scrotum.  43) 01/26/2023: Cycle 13 Day 1 of Kyprolis/Dex 44) 02/16/2023: Cycle 14 Day 1 of Kyprolis/Dex 45) 03/16/2023:Cycle 15 Day 1 of Kyprolis/Dex 46) 04/13/2023: Cycle 16 Day 1 of Kyprolis/Dex 47) 05/11/2023: Cycle 17 Day 1 of Kyprolis/Dex 48) 06/07/2023: Cycle 18 Day 1 of Kyprolis/Dex 49) 07/06/2023: Cycle 19 Day 1 of Kyprolis/Dex 50) 08/02/2023: Cycle 20 Day 1 of Kyprolis/Dex 51) 08/31/2023: Cycle 21 Day 1 of  Kyprolis/Dex 52) 09/28/2023: Cycle 22 Day 1 of Kyprolis/Dex 53) 10/26/2023: Cycle 23 Day 1 of Kyprolis/Dex  Interval History:  [name] 68 y.o. male with medical history significant for  IgG Lambda  Multiple Myeloma who presents for a follow up visit. The patient's last visit was on 10/12/2023.  He presents today prior to treatment with Kyprolis/Dex. He is unaccompanied for this visit.   On exam today [name] reports today he is feeling continues to have runny nose, sneezing and cough. He underwent chest xray yesterday without any acute cardiopulmonary disease. He thinks it may be due to allergies. He reports energy and appetite are stable. He had diarrhea for several days 2 weeks ago which he suspects was secondary to food intolerance. His stools are back to baseline with good output from his ostomy. He denies fevers, chills, night sweats, shortness of breath, chest pain or cough.  He has no other complaints.  Rest of the 10 point ROS is below.  MEDICAL HISTORY:  Past Medical History:  Diagnosis Date   Adrenal insufficiency (HCC)    on chronic dexamethasone   Anemia    Cancer (HCC)    Coagulopathy (HCC)    on xeralto/ s/p DVT while on coumadin,  IVC in place   Diabetes mellitus without complication (HCC)    type 2   Glaucoma 12/22/2022   Gross hematuria 01/2013   post foley cath procedure   History of blood transfusion 01/2013   Lower GI bleeding 04/18/2017   Multiple myeloma    thoracic T8 with paraplegia s/p resection- on chemo at visit 10/13/10   Multiple myeloma    Multiple myeloma without mention of remission    Neurogenic bladder    Neurogenic bowel    OSA (obstructive sleep apnea) 11/01/2022   Paraplegia (HCC)    Partial small bowel obstruction (HCC) during dec 2011 admission    SURGICAL HISTORY: Past Surgical History:  Procedure Laterality Date   COLONOSCOPY WITH PROPOFOL N/A 04/12/2017   Procedure: COLONOSCOPY WITH PROPOFOL;  Surgeon: Hilarie Fredrickson, MD;  Location: [facility];  Service: Endoscopy;  Laterality: N/A;   COLONOSCOPY WITH PROPOFOL N/A 04/19/2017   Procedure: COLONOSCOPY WITH PROPOFOL;  Surgeon: Benancio Deeds, MD;  Location: [facility];  Service:  Gastroenterology;  Laterality: N/A;   COLOSTOMY  07/20/2011   Procedure: COLOSTOMY;  Surgeon: Rulon Abide, DO;  Location: [facility] OR;  Service: General;;   COLOSTOMY REVISION  07/20/2011   Procedure: COLON RESECTION SIGMOID;  Surgeon: Rulon Abide, DO;  Location[facility] OR;  Service: General;;   CYSTOSCOPY N/A 04/04/2013   Procedure: CYSTOSCOPY WITH LITHALOPAXY;  Surgeon: Sebastian Ache, MD;  Location: [facility];  Service: Urology;  Laterality: N/A;   INCISION AND DRAINAGE ABSCESS N/A 12/25/2022   Procedure: INCISION AND DRAINAGE OF SCROTUM;  Surgeon: Despina Arias, MD;  Location: [facility];  Service: Urology;  Laterality: N/A;   INSERTION OF SUPRAPUBIC CATHETER N/A 04/04/2013   Procedure: INSERTION OF SUPRAPUBIC CATHETER;  Surgeon: Sebastian Ache, MD;  Location: [facility];  Service: Urology;  Laterality: N/A;   LAPAROTOMY  07/20/2011   Procedure: EXPLORATORY LAPAROTOMY;  Surgeon: Rulon Abide, DO;  Location: [facility];  Service: General;  Laterality: N/A;   myeloma thoracic T8 with parpaplegia s/p thoracotomy and thoracic T7-9 cage placement on Dec 26th 2011  07/18/10   SCROTAL EXPLORATION N/A 12/29/2022   Procedure: SCROTAL WOUND DEBRIDEMENT AND CLOSURE;  Surgeon: Loletta Parish., MD;  Location: [facility];  Service: Urology;  Laterality: N/A;    SOCIAL HISTORY: Social History   Socioeconomic History   Marital status: Married    Spouse name: Not on file   Number of children: Not on file   Years of education: Not on file   Highest education level: Not on file  Occupational History   Not on file  Tobacco Use   Smoking status: Never   Smokeless tobacco: Never  Vaping Use   Vaping status: Never Used  Substance and Sexual Activity   Alcohol use: No   Drug use: No   Sexual activity: Never  Other Topics Concern   Not on file  Social History Narrative   Not on file   Social Drivers of Health   Financial Resource Strain: Not on file  Food Insecurity: No Food Insecurity (12/22/2022)    Hunger Vital Sign    Worried About Running Out of Food in the Last Year: Never true    Ran Out of Food in the Last Year: Never true  Transportation Needs: No Transportation Needs (12/22/2022)   PRAPARE - Administrator, Civil Service (Medical): No    Lack of Transportation (Non-Medical): No  Physical Activity: Not on file  Stress: Not on file  Social Connections: Not on file  Intimate Partner Violence: Not At Risk (12/22/2022)   Humiliation, Afraid, Rape, and Kick questionnaire    Fear of Current or Ex-Partner: No    Emotionally Abused: No    Physically Abused: No    Sexually Abused: No    FAMILY HISTORY: Family History  Problem Relation Age of Onset   Ovarian cancer Mother    Diabetes Father     ALLERGIES:  is allergic to ferumoxytol.  MEDICATIONS:  Current Outpatient Medications  Medication Sig Dispense Refill   acetaminophen (TYLENOL) 325 MG tablet Take 325-650 mg by mouth every 6 (six) hours as needed for headache or mild pain.     acetic acid 0.25 % irrigation Irrigate with 1 Application as directed See admin instructions. Instill 50 ml's, clamp for 10 minutes, then remove clamp and drain the bladder. Repeat 3 times a week- when not using the Renacidin irrigation     acyclovir (ZOVIRAX) 400 MG tablet Take 1 tablet (400 mg total) by mouth 2 (two) times daily. 60 tablet 2   albuterol (VENTOLIN HFA) 108 (90 Base) MCG/ACT inhaler Inhale 2 puffs into the lungs every 4 (four) hours as needed for wheezing or shortness of breath.     atorvastatin (LIPITOR) 20 MG tablet Take 20 mg by mouth daily.     azithromycin (ZITHROMAX Z-PAK) 250 MG tablet Take 2 pills on Day 1 and 1 pill every day thereafter for a total of 5 days. 6 each 0   BENADRYL ALLERGY 25 MG tablet Take 25 mg by mouth every 6 (six) hours as needed (for allergic reactions).     BIOFREEZE 4 % GEL Apply 1 application  topically every 4 (four) hours as needed (for joint pain- to intact areas of the skin only).      Citric Ac-Gluconolact-Mg Carb (RENACIDIN IR) Irrigate with 30 mLs as directed See admin instructions. Instill 30 ml's, clamp for 10 minutes, then remove clamp and drain the bladder. Repeat 3 times a week- when not using the acetic acid irrigation     COSOPT PF 2-0.5 % SOLN Place 1 drop into both eyes 2 (two) times daily.     diazepam (VALIUM) 5 MG tablet Take 5 mg by mouth  2 (two) times daily as needed for muscle spasms (after spinal cord injury).     Emollient (EUCERIN EX) Apply 1 application  topically See admin instructions. Apply to affected area 2 times a day     FLOVENT HFA 110 MCG/ACT inhaler 2 each See admin instructions. 2 sprays to colostomy stoma prior to each colostomy bag change     guaifenesin (ROBITUSSIN) 100 MG/5ML syrup Take 200 mg by mouth 4 (four) times daily as needed for cough.     iron polysaccharides (NU-IRON) 150 MG capsule Take 1 capsule (150 mg total) by mouth daily. 30 capsule 0   latanoprost (XALATAN) 0.005 % ophthalmic solution Place 1 drop into both eyes at bedtime.     Multiple Vitamin (MULTIVITAMIN WITH MINERALS) TABS tablet Take 1 tablet by mouth daily. 30 tablet 0   NON FORMULARY Apply 1 application  topically See admin instructions. Colo Plast paste- Apply as directed with each ostomy bag change     ondansetron (ZOFRAN) 4 MG tablet Take 1 tablet (4 mg) by mouth every 6 hours as needed for nausea. 20 tablet 0   rivaroxaban (XARELTO) 10 MG TABS tablet Take 1 tablet (10 mg total) by mouth daily with supper. (Patient taking differently: Take 10 mg by mouth daily.) 30 tablet 9   Skin Protectants, Misc. (MINERIN CREME) CREA Apply to affected areas 2 times daily as needed. 454 g 0   Zinc Oxide (BALMEX EX) Apply 1 application  topically See admin instructions. Apply topically daily to affected sites     No current facility-administered medications for this visit.   Facility-Administered Medications Ordered in Other Visits  Medication Dose Route Frequency Provider Last  Rate Last Admin   0.9 %  sodium chloride infusion   Intravenous Once Ikeya Brockel T, PA-C       carfilzomib (KYPROLIS) 150 mg in dextrose 5 % 100 mL chemo infusion  70 mg/m2 (Capped) Intravenous Once Georga Kaufmann T, PA-C 350 mL/hr at 10/26/23 1234 150 mg at 10/26/23 1234   sodium chloride flush (NS) 0.9 % injection 10 mL  10 mL Intravenous PRN Artis Delay, MD        REVIEW OF SYSTEMS:   Constitutional: ( - ) fevers, ( - )  chills , ( - ) night sweats Eyes: ( - ) blurriness of vision, ( - ) double vision, ( - ) watery eyes Ears, nose, mouth, throat, and face: ( - ) mucositis, ( - ) sore throat Respiratory: ( - ) cough, ( - ) dyspnea, ( - ) wheezes Cardiovascular: ( - ) palpitation, ( - ) chest discomfort, ( - ) lower extremity swelling Gastrointestinal:  ( - ) nausea, ( - ) heartburn, ( - ) change in bowel habits Skin: ( - ) abnormal skin rashes Lymphatics: ( - ) new lymphadenopathy, ( - ) easy bruising Neurological: ( - ) numbness, ( - ) tingling, ( - ) new weaknesses Behavioral/Psych: ( - ) mood change, ( - ) new changes  All other systems were reviewed with the patient and are negative.  PHYSICAL EXAMINATION: ECOG PERFORMANCE STATUS: paraplegic.   Vitals:   10/26/23 1003  BP: 117/74  Pulse: 89  Resp: 16  Temp: (!) 97.3 F (36.3 C)  SpO2: 98%       Filed Weights   10/26/23 1003  Weight: 243 lb 11.2 oz (110.5 kg)     GENERAL: well appearing middle aged [place] American male alert, no distress and comfortable SKIN: skin color, texture, turgor  are normal, no rashes or significant lesions EYES: conjunctiva are pink and non-injected, sclera clear LUNGS: clear to auscultation and percussion with normal breathing effort HEART: regular rate & rhythm and no murmurs Musculoskeletal: no cyanosis of digits and no clubbing  PSYCH: alert & oriented x 3, fluent speech NEURO: paraplegic, no use of LE bilaterally.   LABORATORY DATA:  I have reviewed the data as listed    Latest  Ref Rng & Units 10/26/2023    9:35 AM 10/12/2023    9:48 AM 10/05/2023    9:21 AM  CBC  WBC 4.0 - 10.5 K/uL 9.0  11.4  8.8   Hemoglobin 13.0 - 17.0 g/dL 16.1  09.6  04.5   Hematocrit 39.0 - 52.0 % 32.0  32.6  33.9   Platelets 150 - 400 K/uL 404  172  185        Latest Ref Rng & Units 10/26/2023    9:35 AM 10/12/2023    9:48 AM 10/05/2023    9:21 AM  CMP  Glucose 70 - 99 mg/dL 409  811  97   BUN 8 - 23 mg/dL 7  13  16    Creatinine 0.61 - 1.24 mg/dL 9.14  7.82  9.56   Sodium 135 - 145 mmol/L 141  144  144   Potassium 3.5 - 5.1 mmol/L 3.5  3.3  3.6   Chloride 98 - 111 mmol/L 111  110  110   CO2 22 - 32 mmol/L 23  26  27    Calcium 8.9 - 10.3 mg/dL 9.1  8.9  9.2   Total Protein 6.5 - 8.1 g/dL 6.4  6.1  6.4   Total Bilirubin 0.0 - 1.2 mg/dL 0.6  0.9  0.7   Alkaline Phos 38 - 126 U/L 105  93  92   AST 15 - 41 U/L 18  14  12    ALT 0 - 44 U/L 20  24  17      Lab Results  Component Value Date   MPROTEIN 0.1 (H) 10/05/2023   MPROTEIN Not Observed 08/31/2023   MPROTEIN Not Observed 08/02/2023   Lab Results  Component Value Date   KPAFRELGTCHN 4.8 10/05/2023   KPAFRELGTCHN 11.2 08/31/2023   KPAFRELGTCHN 7.2 08/02/2023   LAMBDASER 3.4 (L) 10/05/2023   LAMBDASER 5.5 (L) 08/31/2023   LAMBDASER 5.5 (L) 08/02/2023   KAPLAMBRATIO 1.41 10/05/2023   KAPLAMBRATIO 2.04 (H) 08/31/2023   KAPLAMBRATIO 1.31 08/02/2023    RADIOGRAPHIC STUDIES: DG Chest 1 View Result Date: 10/25/2023 CLINICAL DATA:  SUBACUTE PRODUCTIVE COUGH, CANCER PT , UNDER ACTIVE TX EXAM: CHEST  1 VIEW COMPARISON:  Dec 19, 2022 FINDINGS: Low lung volumes. No focal airspace consolidation, pleural effusion, or pneumothorax. No cardiomegaly. No acute fracture. Unchanged cortical irregularity along the posterolateral aspect of the left sixth rib. Corpectomy and lateral fusion of the midthoracic spine. Multilevel degenerative disc disease of the spine. IMPRESSION: Low lung volumes.  Otherwise, no acute cardiopulmonary abnormality.  Electronically Signed   By: Wallie Char M.D.   On: 10/25/2023 12:39     ASSESSMENT & PLAN [name] is a 68 y.o.  male with medical history significant for  IgG Lambda Multiple Myeloma who presents for a follow up visit.  # IgG Lambda Multiple Myeloma, Relapsed (ISS Stage II) --findings are most consistent with relapsed multiple myeloma. Patient previously successfully treated with Velcade/Rev/Dex and Daratumumab/Velcade/Dex. On 07/02/2020 he transitioned to monthly daratumumab alone.  --due to to rise in M protein, switched to  Kyprolis and Dexamethasone on 01/06/2022 Plan: --Due for Cycle 23, Day 1 of Kyprolis/Dex today  --Labs show white blood cell count 9.0, hemoglobin 10.9, MCV 82.9, platelets 404. Creatinine and LFTs are in range.  --Most recent myeloma labs from 10/05/2023 showed M protein 0.1 with kappa 4.8, lambda 3.4, ratio 1.41.  --Proceed with treatment today without any dose modifications. --Continue with weekly labs plus Kyprolis/Dex treatment as scheduled  #Anemia: --Likely secondary to MM +/- iron deficiency --Currently on PO iron therapy daily --Will recheck iron panel today  #Upper respiratory symptoms --No improvement with Z-pack --Suspect allergies, advised to take Zyrtec plus Flonase  # Memory loss: --Encouraged to do memory exercises including puzzles, suduko, etc --If symptoms worsen, we can make a referral to neurology for further evaluation.   #History of Sepsis 2/2 UTI and Fournier's gangrene of scrotum: --Hospitalized from 12/22/2022-01/08/2023. Underwent debridement of necrotic tissue. --Given PIP tazobactam and Zyvox initially --Urine culture reveals Proteus mirabilis which is resistant to ciprofloxacin and nitrofurantoin --Blood cultures negative --D/C on remainder doxycycline and Augmentin which he completed on 01/07/2023.  --Evaluated by urology on 7/2 and ID on 7/3 with continued wound healing and no further d/c or evidence of infection.    #History of DVT --He had placement of IVC filter, remains on Xarelto. --Due to poor mobility, and lack of bleeding complications, recommend to continue on Xarelto indefinitely. --caution if Plt count were to drop <50  #Snoring/Risk for OSA: --Diagnosed with OSA and has CPAP machine.   # Supportive Care -- provided patient with an albuterol inhaler (for use with daratumumab) --acyclovir 400mg  BID for VZV prophylaxis --zofran 8mg  q8H PRN for nausea/vomiting  --Zometa is being held in the setting of his prior episode of osteonecrosis of the jaw  No orders of the defined types were placed in this encounter.   All questions were answered. The patient knows to call the clinic with any problems, questions or concerns.  I have spent a total of 30 minutes minutes of face-to-face and non-face-to-face time, preparing to see the patient,  performing a medically appropriate examination, counseling and educating the patient, communicating with other health care professionals, documenting clinical information in the electronic health record,  and care coordination.   Georga Kaufmann PA-C Dept of Hematology and Oncology [facility] Phone: [phone]      10/26/2023 1:04 PM   Literature Support:  Milagros Loll, Petrucci MT, [place], [place], [place], [place], Spada S, H[place], Ponticelli E, Rennert, Cavo M, Di Toritto TC, Haydee Salter F, Montefusco V, Palumbo A, Boccadoro M, Larocca A. Once-weekly versus twice-weekly carfilzomib in patients with newly diagnosed multiple myeloma: a pooled analysis of two phase I/II studies. Haematologica. 2019 Aug;104(8):1640-1647.  --Once-weekly 70 mg/m2 carfilzomib as induction and maintenance therapy for newly diagnosed multiple myeloma patients was as safe and effective as twice-weekly 36 mg/m2 carfilzomib and provided a more convenient schedule.

## 2023-10-26 NOTE — Patient Instructions (Signed)
 [facility] - A DEPT OF [facility]  Discharge Instructions: Thank you for choosing [facility] to provide your oncology and hematology care.   If you have a lab appointment with the Cancer Center, please go directly to the Cancer Center and check in at the registration area.   Wear comfortable clothing and clothing appropriate for easy access to any Portacath or PICC line.   We strive to give you quality time with your provider. You may need to reschedule your appointment if you arrive late (15 or more minutes).  Arriving late affects you and other patients whose appointments are after yours.  Also, if you miss three or more appointments without notifying the office, you may be dismissed from the clinic at the provider's discretion.      For prescription refill requests, have your pharmacy contact our office and allow 72 hours for refills to be completed.    Today you received the following chemotherapy and/or immunotherapy agents: carfilzomib (KYPROLIS)      To help prevent nausea and vomiting after your treatment, we encourage you to take your nausea medication as directed.  BELOW ARE SYMPTOMS THAT SHOULD BE REPORTED IMMEDIATELY: *FEVER GREATER THAN 100.4 F (38 C) OR HIGHER *CHILLS OR SWEATING *NAUSEA AND VOMITING THAT IS NOT CONTROLLED WITH YOUR NAUSEA MEDICATION *UNUSUAL SHORTNESS OF BREATH *UNUSUAL BRUISING OR BLEEDING *URINARY PROBLEMS (pain or burning when urinating, or frequent urination) *BOWEL PROBLEMS (unusual diarrhea, constipation, pain near the anus) TENDERNESS IN MOUTH AND THROAT WITH OR WITHOUT PRESENCE OF ULCERS (sore throat, sores in mouth, or a toothache) UNUSUAL RASH, SWELLING OR PAIN  UNUSUAL VAGINAL DISCHARGE OR ITCHING   Items with * indicate a potential emergency and should be followed up as soon as possible or go to the Emergency Department if any problems should occur.  Please show the CHEMOTHERAPY ALERT CARD or  IMMUNOTHERAPY ALERT CARD at check-in to the Emergency Department and triage nurse.  Should you have questions after your visit or need to cancel or reschedule your appointment, please contact [facility] - A DEPT OF [facility]  Dept: [phone]  and follow the prompts.  Office hours are 8:00 a.m. to 4:30 p.m. Monday - Friday. Please note that voicemails left after 4:00 p.m. may not be returned until the following business day.  We are closed weekends and major holidays. You have access to a nurse at all times for urgent questions. Please call the main number to the clinic Dept: [phone] and follow the prompts.   For any non-urgent questions, you may also contact your provider using MyChart. We now offer e-Visits for anyone 62 and older to request care online for non-urgent symptoms. For details visit [email or web address].   Also download the MyChart app! Go to the app store, search "MyChart", open the app, select [place], and log in with your MyChart username and password.

## 2023-10-29 LAB — KAPPA/LAMBDA LIGHT CHAINS
Kappa free light chain: 19.8 mg/L — ABNORMAL HIGH (ref 3.3–19.4)
Kappa, lambda light chain ratio: 1.58 (ref 0.26–1.65)
Lambda free light chains: 12.5 mg/L (ref 5.7–26.3)

## 2023-10-30 LAB — MULTIPLE MYELOMA PANEL, SERUM
Albumin SerPl Elph-Mcnc: 3.2 g/dL (ref 2.9–4.4)
Albumin/Glob SerPl: 1.2 (ref 0.7–1.7)
Alpha 1: 0.3 g/dL (ref 0.0–0.4)
Alpha2 Glob SerPl Elph-Mcnc: 0.8 g/dL (ref 0.4–1.0)
B-Globulin SerPl Elph-Mcnc: 1.1 g/dL (ref 0.7–1.3)
Gamma Glob SerPl Elph-Mcnc: 0.6 g/dL (ref 0.4–1.8)
Globulin, Total: 2.7 g/dL (ref 2.2–3.9)
IgA: 88 mg/dL (ref 61–437)
IgG (Immunoglobin G), Serum: 672 mg/dL (ref 603–1613)
IgM (Immunoglobulin M), Srm: 30 mg/dL (ref 20–172)
M Protein SerPl Elph-Mcnc: 0.1 g/dL — ABNORMAL HIGH
Total Protein ELP: 5.9 g/dL — ABNORMAL LOW (ref 6.0–8.5)

## 2023-11-02 ENCOUNTER — Inpatient Hospital Stay: Payer: Medicare (Managed Care)

## 2023-11-02 ENCOUNTER — Other Ambulatory Visit: Payer: Self-pay

## 2023-11-02 ENCOUNTER — Other Ambulatory Visit: Payer: Self-pay | Admitting: Hematology and Oncology

## 2023-11-02 VITALS — BP 149/87 | HR 70 | Temp 97.7°F | Resp 18

## 2023-11-02 DIAGNOSIS — C9002 Multiple myeloma in relapse: Secondary | ICD-10-CM

## 2023-11-02 DIAGNOSIS — Z5111 Encounter for antineoplastic chemotherapy: Secondary | ICD-10-CM | POA: Diagnosis not present

## 2023-11-02 LAB — CMP (CANCER CENTER ONLY)
ALT: 19 U/L (ref 0–44)
AST: 13 U/L — ABNORMAL LOW (ref 15–41)
Albumin: 4.1 g/dL (ref 3.5–5.0)
Alkaline Phosphatase: 110 U/L (ref 38–126)
Anion gap: 6 (ref 5–15)
BUN: 12 mg/dL (ref 8–23)
CO2: 28 mmol/L (ref 22–32)
Calcium: 9.9 mg/dL (ref 8.9–10.3)
Chloride: 113 mmol/L — ABNORMAL HIGH (ref 98–111)
Creatinine: 0.69 mg/dL (ref 0.61–1.24)
GFR, Estimated: >60 mL/min (ref >60)
Glucose, Bld: 79 mg/dL (ref 70–99)
Potassium: 4 mmol/L (ref 3.5–5.1)
Sodium: 147 mmol/L — ABNORMAL HIGH (ref 135–145)
Total Bilirubin: 0.7 mg/dL (ref 0.0–1.2)
Total Protein: 6.8 g/dL (ref 6.5–8.1)

## 2023-11-02 LAB — CBC WITH DIFFERENTIAL (CANCER CENTER ONLY)
Abs Immature Granulocytes: 0.05 10*3/uL (ref 0.00–0.07)
Basophils Absolute: 0 10*3/uL (ref 0.0–0.1)
Basophils Relative: 1 %
Eosinophils Absolute: 0.3 10*3/uL (ref 0.0–0.5)
Eosinophils Relative: 3 %
HCT: 34.3 % — ABNORMAL LOW (ref 39.0–52.0)
Hemoglobin: 11.7 g/dL — ABNORMAL LOW (ref 13.0–17.0)
Immature Granulocytes: 1 %
Lymphocytes Relative: 13 %
Lymphs Abs: 1 10*3/uL (ref 0.7–4.0)
MCH: 27.8 pg (ref 26.0–34.0)
MCHC: 34.1 g/dL (ref 30.0–36.0)
MCV: 81.5 fL (ref 80.0–100.0)
Monocytes Absolute: 0.8 10*3/uL (ref 0.1–1.0)
Monocytes Relative: 10 %
Neutro Abs: 6 10*3/uL (ref 1.7–7.7)
Neutrophils Relative %: 72 %
Platelet Count: 193 10*3/uL (ref 150–400)
RBC: 4.21 MIL/uL — ABNORMAL LOW (ref 4.22–5.81)
RDW: 18 % — ABNORMAL HIGH (ref 11.5–15.5)
WBC Count: 8.1 10*3/uL (ref 4.0–10.5)
nRBC: 0 % (ref 0.0–0.2)

## 2023-11-02 MED ORDER — SODIUM CHLORIDE 0.9 % IV SOLN
40.0000 mg | Freq: Once | INTRAVENOUS | Status: AC
  Administered 2023-11-02: 40 mg via INTRAVENOUS
  Filled 2023-11-02: qty 4

## 2023-11-02 MED ORDER — SODIUM CHLORIDE 0.9 % IV SOLN: Freq: Once | INTRAVENOUS | Status: AC

## 2023-11-02 MED ORDER — DEXTROSE 5 % IV SOLN
70.0000 mg/m2 | Freq: Once | INTRAVENOUS | Status: AC
  Administered 2023-11-02: 150 mg via INTRAVENOUS
  Filled 2023-11-02: qty 60

## 2023-11-02 MED ORDER — SODIUM CHLORIDE 0.9 % IV SOLN
Freq: Once | INTRAVENOUS | Status: AC
Start: 2023-11-02 — End: 2023-11-02

## 2023-11-02 NOTE — Patient Instructions (Signed)
 [facility] - A DEPT OF [facility]  Discharge Instructions: Thank you for choosing [facility] to provide your oncology and hematology care.   If you have a lab appointment with the Cancer Center, please go directly to the Cancer Center and check in at the registration area.   Wear comfortable clothing and clothing appropriate for easy access to any Portacath or PICC line.   We strive to give you quality time with your provider. You may need to reschedule your appointment if you arrive late (15 or more minutes).  Arriving late affects you and other patients whose appointments are after yours.  Also, if you miss three or more appointments without notifying the office, you may be dismissed from the clinic at the provider's discretion.      For prescription refill requests, have your pharmacy contact our office and allow 72 hours for refills to be completed.    Today you received the following chemotherapy and/or immunotherapy agents Kyprolis      To help prevent nausea and vomiting after your treatment, we encourage you to take your nausea medication as directed.  BELOW ARE SYMPTOMS THAT SHOULD BE REPORTED IMMEDIATELY: *FEVER GREATER THAN 100.4 F (38 C) OR HIGHER *CHILLS OR SWEATING *NAUSEA AND VOMITING THAT IS NOT CONTROLLED WITH YOUR NAUSEA MEDICATION *UNUSUAL SHORTNESS OF BREATH *UNUSUAL BRUISING OR BLEEDING *URINARY PROBLEMS (pain or burning when urinating, or frequent urination) *BOWEL PROBLEMS (unusual diarrhea, constipation, pain near the anus) TENDERNESS IN MOUTH AND THROAT WITH OR WITHOUT PRESENCE OF ULCERS (sore throat, sores in mouth, or a toothache) UNUSUAL RASH, SWELLING OR PAIN  UNUSUAL VAGINAL DISCHARGE OR ITCHING   Items with * indicate a potential emergency and should be followed up as soon as possible or go to the Emergency Department if any problems should occur.  Please show the CHEMOTHERAPY ALERT CARD or IMMUNOTHERAPY  ALERT CARD at check-in to the Emergency Department and triage nurse.  Should you have questions after your visit or need to cancel or reschedule your appointment, please contact [facility] - A DEPT OF [facility]  Dept: [phone]  and follow the prompts.  Office hours are 8:00 a.m. to 4:30 p.m. Monday - Friday. Please note that voicemails left after 4:00 p.m. may not be returned until the following business day.  We are closed weekends and major holidays. You have access to a nurse at all times for urgent questions. Please call the main number to the clinic Dept: [phone] and follow the prompts.   For any non-urgent questions, you may also contact your provider using MyChart. We now offer e-Visits for anyone 49 and older to request care online for non-urgent symptoms. For details visit [email or web address].   Also download the MyChart app! Go to the app store, search "MyChart", open the app, select [facility] [place], and log in with your MyChart username and password.

## 2023-11-09 ENCOUNTER — Inpatient Hospital Stay: Payer: Medicare (Managed Care)

## 2023-11-09 ENCOUNTER — Inpatient Hospital Stay (HOSPITAL_BASED_OUTPATIENT_CLINIC_OR_DEPARTMENT_OTHER): Payer: Medicare (Managed Care) | Admitting: Hematology and Oncology

## 2023-11-09 VITALS — BP 162/81 | HR 68 | Temp 97.0°F | Resp 13 | Wt 253.0 lb

## 2023-11-09 DIAGNOSIS — Z95828 Presence of other vascular implants and grafts: Secondary | ICD-10-CM | POA: Diagnosis not present

## 2023-11-09 DIAGNOSIS — Z5111 Encounter for antineoplastic chemotherapy: Secondary | ICD-10-CM

## 2023-11-09 DIAGNOSIS — C9002 Multiple myeloma in relapse: Secondary | ICD-10-CM

## 2023-11-09 DIAGNOSIS — Z7901 Long term (current) use of anticoagulants: Secondary | ICD-10-CM

## 2023-11-09 LAB — CMP (CANCER CENTER ONLY)
ALT: 18 U/L (ref 0–44)
AST: 13 U/L — ABNORMAL LOW (ref 15–41)
Albumin: 3.6 g/dL (ref 3.5–5.0)
Alkaline Phosphatase: 95 U/L (ref 38–126)
Anion gap: 6 (ref 5–15)
BUN: 12 mg/dL (ref 8–23)
CO2: 27 mmol/L (ref 22–32)
Calcium: 9.1 mg/dL (ref 8.9–10.3)
Chloride: 111 mmol/L (ref 98–111)
Creatinine: 0.66 mg/dL (ref 0.61–1.24)
GFR, Estimated: >60 mL/min (ref >60)
Glucose, Bld: 87 mg/dL (ref 70–99)
Potassium: 3.6 mmol/L (ref 3.5–5.1)
Sodium: 144 mmol/L (ref 135–145)
Total Bilirubin: 0.7 mg/dL (ref 0.0–1.2)
Total Protein: 6 g/dL — ABNORMAL LOW (ref 6.5–8.1)

## 2023-11-09 LAB — CBC WITH DIFFERENTIAL (CANCER CENTER ONLY)
Abs Immature Granulocytes: 0.05 10*3/uL (ref 0.00–0.07)
Basophils Absolute: 0 10*3/uL (ref 0.0–0.1)
Basophils Relative: 0 %
Eosinophils Absolute: 0.3 10*3/uL (ref 0.0–0.5)
Eosinophils Relative: 3 %
HCT: 31.4 % — ABNORMAL LOW (ref 39.0–52.0)
Hemoglobin: 10.8 g/dL — ABNORMAL LOW (ref 13.0–17.0)
Immature Granulocytes: 1 %
Lymphocytes Relative: 11 %
Lymphs Abs: 1 10*3/uL (ref 0.7–4.0)
MCH: 28 pg (ref 26.0–34.0)
MCHC: 34.4 g/dL (ref 30.0–36.0)
MCV: 81.3 fL (ref 80.0–100.0)
Monocytes Absolute: 0.8 10*3/uL (ref 0.1–1.0)
Monocytes Relative: 9 %
Neutro Abs: 6.9 10*3/uL (ref 1.7–7.7)
Neutrophils Relative %: 76 %
Platelet Count: 153 10*3/uL (ref 150–400)
RBC: 3.86 MIL/uL — ABNORMAL LOW (ref 4.22–5.81)
RDW: 18.4 % — ABNORMAL HIGH (ref 11.5–15.5)
WBC Count: 9 10*3/uL (ref 4.0–10.5)
nRBC: 0 % (ref 0.0–0.2)

## 2023-11-09 MED ORDER — DEXTROSE 5 % IV SOLN
70.0000 mg/m2 | Freq: Once | INTRAVENOUS | Status: AC
  Administered 2023-11-09: 150 mg via INTRAVENOUS
  Filled 2023-11-09: qty 60

## 2023-11-09 MED ORDER — SODIUM CHLORIDE 0.9 % IV SOLN
40.0000 mg | Freq: Once | INTRAVENOUS | Status: AC
  Administered 2023-11-09: 40 mg via INTRAVENOUS
  Filled 2023-11-09: qty 4

## 2023-11-09 MED ORDER — ONDANSETRON HCL 4 MG/2ML IJ SOLN
8.0000 mg | Freq: Once | INTRAMUSCULAR | Status: AC
  Administered 2023-11-09: 8 mg via INTRAVENOUS

## 2023-11-09 MED ORDER — SODIUM CHLORIDE 0.9 % IV SOLN: Freq: Once | INTRAVENOUS | Status: AC

## 2023-11-09 NOTE — Progress Notes (Unsigned)
 [facility] [phone]   [phone]  PROGRESS NOTE  Patient Care Team: Gary Every, MD as PCP - General (Family Medicine) Gary Caddy, MD as Consulting Physician (Physical Medicine and Rehabilitation) Gary Baldy Everlyn Hockey, PA-C as Physician Assistant (Physical Medicine and Rehabilitation) Gary Bame, MD as Consulting Physician (Hematology and Oncology) Gary Bame, MD as Consulting Physician (Hematology and Oncology) Gary Bame, MD as Consulting Physician (Hematology and Oncology)  Hematological/Oncological History # IgG Lambda Multiple Myeloma, Relapsed (ISS Stage II) 1) 06/2010: initial diagnosis of Multiple Myeloma after T8 compression fracture. Treated with Velcade /Revlimid /Dexamethasone  and achieved a complete remission 2) Velcade  was discontinued in September 2012 and that Revlimid  and Decadron  were discontinued in March 2013. 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 4) Followed by Dr. Marton Howell, last clinic visit 10/09/2019. At that time there was concern for relapse of his multiple myeloma.  5) Patient requested transfer to different provider after misunderstanding regarding imaging studies 6) 12/17/2019: transfer care to Dr. Rosaline Howell  7) 01/09/2020: Cycle 1 Day 1 of Dara/Velcade /Dex 8) 01/21/2020: presented as urgent visit for diarrhea and dehydration. Holding chemotherapy scheduled for 01/23/2020. 9) 01/30/2020: Resume dara/velcade /dex after resolution of diarrhea.  10) 02/13/2020: restaging labs show M protein 0.8, Kappa 4.5, lamba 17.2, ratio 0.26, urine M protein 53 (7.1%). All MM labs indicate improvement.  11) 03/10/2020: Cycle 4 Day 1 of Dara/Velcade /Dex. Transition to q 3 week daratumumab .  12) 06/04/2020:  Cycle 8 Day 1 of Dara/Velcade /Dex 13) 06/25/2020:  Cycle 9 Day 1 of Dara/Velcade /Dex 14) 07/22/2020: Cycle 10 Day 1  of  Dara//Dex 15) 08/18/2020: Cycle 11 Day 1 of Dara//Dex 16) 09/23/2020: Cycle 12 Day 1 of Dara//Dex 17) 10/22/2020: Cycle 13 Day 1 of Dara/Dex  18) 11/19/2020: Cycle 14 Day 1 of Dara/Dex  19) 12/17/2020: Cycle 15 Day 1 of Dara/Dex  20) 01/17/2021: Cycle 16 Day 1 of Dara/Dex  21) 02/11/2021: Cycle 17 Day 1 of Dara/Dex 22) 03/10/2021: Cycle 18 Day 1 of Dara/Dex  23) 04/08/2021: Cycle 19 Day 1 of Dara/Dex  24) 08/03/2021: Cycle 20 Day 1 of Dara/Dex (delayed due to scheduling error) 25) 09/02/2021: Cycle 21 Day 1 of Dara/Dex 26) 09/30/2021: Cycle 22 Day 1 of Dara/Dex 27) 10/28/2021: Cycle 23 Day 1 of Dara/Dex 28) 12/02/2021: Cycle 24 Day 1 of Dara/Dex 29) 12/30/2021: Cycle 25 Day 1 of Dara/Dex 30) 01/06/2022: Cycle 1 Day 1 of Kyprolis /Dex 31) 02/03/2022: Cycle 2 Day 1 of Kyprolis /Dex 32) 02/24/2022: Cycle 3 Day 1 of Kyprolis /Dex 33) 04/07/2022: Cycle 4 Day 1 of Kyprolis /Dex 34) 05/05/2022: Cycle 5 Day 1 of Kyprolis /Dex 35) 06/01/2022: Cycle 6 Day 1 of Kyprolis /Dex 36) 06/29/2022: Cycle 7 Day 1 of Kyprolis /Dex 37) 07/28/2022: Cycle 8 Day 1 of Kyprolis /Dex 38) 08/25/2022: Cycle 9 Day 1 of Kyprolis /Dex 39) 09/22/2022: Cycle 10 Day 1 of Kyprolis /Dex 40) 10/20/2022: Cycle 11 Day 1 of Kyprolis /Dex 41) 11/17/2022: Cycle 12 Day 1 of Kyprolis /Dex 42 ) 12/22/2022-01/08/2023: Admitted for fournier's gangrene of scrotum.  43) 01/26/2023: Cycle 13 Day 1 of Kyprolis /Dex 44) 02/16/2023: Cycle 14 Day 1 of Kyprolis /Dex 45) 03/16/2023:Cycle 15 Day 1 of Kyprolis /Dex 46) 04/13/2023: Cycle 16 Day 1 of Kyprolis /Dex 47) 05/11/2023: Cycle 17 Day 1 of Kyprolis /Dex 48) 06/07/2023: Cycle 18 Day 1 of Kyprolis /Dex 49) 07/06/2023: Cycle 19 Day 1 of Kyprolis /Dex 50) 08/02/2023: Cycle 20 Day 1 of Kyprolis /Dex 51) 08/31/2023: Cycle 21 Day 1 of  Kyprolis /Dex 52) 09/28/2023: Cycle 22 Day 1 of Kyprolis /Dex 53) 10/26/2023: Cycle 23 Day 1 of Kyprolis /Dex  Interval History:  [name] 68 y.o. male with medical history significant for  IgG Lambda  Multiple Myeloma who presents for a follow up visit. The patient's last visit was on 10/12/2023.  He presents today prior to treatment with Kyprolis /Dex. He is unaccompanied for this visit.   On exam today [name] reports today he is feeling continues to have runny nose, sneezing and cough. He underwent chest xray yesterday without any acute cardiopulmonary disease. He thinks it may be due to allergies. He reports energy and appetite are stable. He had diarrhea for several days 2 weeks ago which he suspects was secondary to food intolerance. His stools are back to baseline with good output from his ostomy. He denies fevers, chills, night sweats, shortness of breath, chest pain or cough.  He has no other complaints.  Rest of the 10 point ROS is below.  MEDICAL HISTORY:  Past Medical History:  Diagnosis Date   Adrenal insufficiency (HCC)    on chronic dexamethasone    Anemia    Cancer (HCC)    Coagulopathy (HCC)    on xeralto/ s/p DVT while on coumadin,  IVC in place   Diabetes mellitus without complication (HCC)    type 2   Glaucoma 12/22/2022   Gross hematuria 01/2013   post foley cath procedure   History of blood transfusion 01/2013   Lower GI bleeding 04/18/2017   Multiple myeloma    thoracic T8 with paraplegia s/p resection- on chemo at visit 10/13/10   Multiple myeloma    Multiple myeloma without mention of remission    Neurogenic bladder    Neurogenic bowel    OSA (obstructive sleep apnea) 11/01/2022   Paraplegia (HCC)    Partial small bowel obstruction (HCC) during dec 2011 admission    SURGICAL HISTORY: Past Surgical History:  Procedure Laterality Date   COLONOSCOPY WITH PROPOFOL  N/A 04/12/2017   Procedure: COLONOSCOPY WITH PROPOFOL ;  Surgeon: Gary Forts, MD;  Location: [facility];  Service: Endoscopy;  Laterality: N/A;   COLONOSCOPY WITH PROPOFOL  N/A 04/19/2017   Procedure: COLONOSCOPY WITH PROPOFOL ;  Surgeon: Gary Holder, MD;  Location: [facility];  Service:  Gastroenterology;  Laterality: N/A;   COLOSTOMY  07/20/2011   Procedure: COLOSTOMY;  Surgeon: Gary Laughter, DO;  Location[facility] OR;  Service: General;;   COLOSTOMY REVISION  07/20/2011   Procedure: COLON RESECTION SIGMOID;  Surgeon: Gary Laughter, DO;  Location: [facility] OR;  Service: General;;   CYSTOSCOPY N/A 04/04/2013   Procedure: CYSTOSCOPY WITH LITHALOPAXY;  Surgeon: Osborn Blaze, MD;  Location: [facility];  Service: Urology;  Laterality: N/A;   INCISION AND DRAINAGE ABSCESS N/A 12/25/2022   Procedure: INCISION AND DRAINAGE OF SCROTUM;  Surgeon: Mallie Seal, MD;  Location: [facility];  Service: Urology;  Laterality: N/A;   INSERTION OF SUPRAPUBIC CATHETER N/A 04/04/2013   Procedure: INSERTION OF SUPRAPUBIC CATHETER;  Surgeon: Osborn Blaze, MD;  Location: [facility];  Service: Urology;  Laterality: N/A;   LAPAROTOMY  07/20/2011   Procedure: EXPLORATORY LAPAROTOMY;  Surgeon: Gary Laughter, DO;  Location: [facility] OR;  Service: General;  Laterality: N/A;   myeloma thoracic T8 with parpaplegia s/p thoracotomy and thoracic T7-9 cage placement on Dec 26th 2011  07/18/10   SCROTAL EXPLORATION N/A 12/29/2022   Procedure: SCROTAL WOUND DEBRIDEMENT AND CLOSURE;  Surgeon: Melody Spurling., MD;  Location: [facility];  Service: Urology;  Laterality: N/A;    SOCIAL HISTORY: Social History   Socioeconomic History   Marital status: Married    Spouse name: Not on file   Number of children: Not on file   Years of education: Not on file   Highest education level: Not on file  Occupational History   Not on file  Tobacco Use   Smoking status: Never   Smokeless tobacco: Never  Vaping Use   Vaping status: Never Used  Substance and Sexual Activity   Alcohol use: No   Drug use: No   Sexual activity: Never  Other Topics Concern   Not on file  Social History Narrative   Not on file   Social Drivers of Health   Financial Resource Strain: Not on file  Food Insecurity: No Food Insecurity (12/22/2022)    Hunger Vital Sign    Worried About Running Out of Food in the Last Year: Never true    Ran Out of Food in the Last Year: Never true  Transportation Needs: No Transportation Needs (12/22/2022)   PRAPARE - Administrator, Civil Service (Medical): No    Lack of Transportation (Non-Medical): No  Physical Activity: Not on file  Stress: Not on file  Social Connections: Not on file  Intimate Partner Violence: Not At Risk (12/22/2022)   Humiliation, Afraid, Rape, and Kick questionnaire    Fear of Current or Ex-Partner: No    Emotionally Abused: No    Physically Abused: No    Sexually Abused: No    FAMILY HISTORY: Family History  Problem Relation Age of Onset   Ovarian cancer Mother    Diabetes Father     ALLERGIES:  is allergic to ferumoxytol .  MEDICATIONS:  Current Outpatient Medications  Medication Sig Dispense Refill   acetaminophen  (TYLENOL ) 325 MG tablet Take 325-650 mg by mouth Howell 6 (six) hours as needed for headache or mild pain.     acetic acid 0.25 % irrigation Irrigate with 1 Application as directed See admin instructions. Instill 50 ml's, clamp for 10 minutes, then remove clamp and drain the bladder. Repeat 3 times a week- when not using the Renacidin irrigation     acyclovir  (ZOVIRAX ) 400 MG tablet Take 1 tablet (400 mg total) by mouth 2 (two) times daily. 60 tablet 2   albuterol  (VENTOLIN  HFA) 108 (90 Base) MCG/ACT inhaler Inhale 2 puffs into the lungs Howell 4 (four) hours as needed for wheezing or shortness of breath.     atorvastatin  (LIPITOR) 20 MG tablet Take 20 mg by mouth daily.     azithromycin  (ZITHROMAX  Z-PAK) 250 MG tablet Take 2 pills on Day 1 and 1 pill Howell day thereafter for a total of 5 days. 6 each 0   BENADRYL  ALLERGY 25 MG tablet Take 25 mg by mouth Howell 6 (six) hours as needed (for allergic reactions).     BIOFREEZE 4 % GEL Apply 1 application  topically Howell 4 (four) hours as needed (for joint pain- to intact areas of the skin only).      Citric Ac-Gluconolact-Mg Carb (RENACIDIN IR) Irrigate with 30 mLs as directed See admin instructions. Instill 30 ml's, clamp for 10 minutes, then remove clamp and drain the bladder. Repeat 3 times a week- when not using the acetic acid irrigation     COSOPT  PF 2-0.5 % SOLN Place 1 drop into both eyes 2 (two) times daily.     diazepam  (VALIUM ) 5 MG tablet Take 5 mg by mouth  2 (two) times daily as needed for muscle spasms (after spinal cord injury).     Emollient (EUCERIN EX) Apply 1 application  topically See admin instructions. Apply to affected area 2 times a day     FLOVENT HFA 110 MCG/ACT inhaler 2 each See admin instructions. 2 sprays to colostomy stoma prior to each colostomy bag change     guaifenesin  (ROBITUSSIN) 100 MG/5ML syrup Take 200 mg by mouth 4 (four) times daily as needed for cough.     iron  polysaccharides (NU-IRON ) 150 MG capsule Take 1 capsule (150 mg total) by mouth daily. 30 capsule 0   latanoprost  (XALATAN ) 0.005 % ophthalmic solution Place 1 drop into both eyes at bedtime.     Multiple Vitamin (MULTIVITAMIN WITH MINERALS) TABS tablet Take 1 tablet by mouth daily. 30 tablet 0   NON FORMULARY Apply 1 application  topically See admin instructions. Colo Plast paste- Apply as directed with each ostomy bag change     ondansetron  (ZOFRAN ) 4 MG tablet Take 1 tablet (4 mg) by mouth Howell 6 hours as needed for nausea. 20 tablet 0   rivaroxaban  (XARELTO ) 10 MG TABS tablet Take 1 tablet (10 mg total) by mouth daily with supper. (Patient taking differently: Take 10 mg by mouth daily.) 30 tablet 9   Skin Protectants, Misc. (MINERIN CREME) CREA Apply to affected areas 2 times daily as needed. 454 g 0   Zinc  Oxide (BALMEX EX) Apply 1 application  topically See admin instructions. Apply topically daily to affected sites     No current facility-administered medications for this visit.   Facility-Administered Medications Ordered in Other Visits  Medication Dose Route Frequency Provider Last  Rate Last Admin   sodium chloride  flush (NS) 0.9 % injection 10 mL  10 mL Intravenous PRN [name], Ni, MD        REVIEW OF SYSTEMS:   Constitutional: ( - ) fevers, ( - )  chills , ( - ) night sweats Eyes: ( - ) blurriness of vision, ( - ) double vision, ( - ) watery eyes Ears, nose, mouth, throat, and face: ( - ) mucositis, ( - ) sore throat Respiratory: ( - ) cough, ( - ) dyspnea, ( - ) wheezes Cardiovascular: ( - ) palpitation, ( - ) chest discomfort, ( - ) lower extremity swelling Gastrointestinal:  ( - ) nausea, ( - ) heartburn, ( - ) change in bowel habits Skin: ( - ) abnormal skin rashes Lymphatics: ( - ) new lymphadenopathy, ( - ) easy bruising Neurological: ( - ) numbness, ( - ) tingling, ( - ) new weaknesses Behavioral/Psych: ( - ) mood change, ( - ) new changes  All other systems were reviewed with the patient and are negative.  PHYSICAL EXAMINATION: ECOG PERFORMANCE STATUS: paraplegic.   There were no vitals filed for this visit.      There were no vitals filed for this visit.    GENERAL: well appearing middle aged [place] American male alert, no distress and comfortable SKIN: skin color, texture, turgor are normal, no rashes or significant lesions EYES: conjunctiva are pink and non-injected, sclera clear LUNGS: clear to auscultation and percussion with normal breathing effort HEART: regular rate & rhythm and no murmurs Musculoskeletal: no cyanosis of digits and no clubbing  PSYCH: alert & oriented x 3, fluent speech NEURO: paraplegic, no use of LE bilaterally.   LABORATORY DATA:  I have reviewed the data as listed    Latest Ref Rng & Units 11/02/2023  10:23 AM 10/26/2023    9:35 AM 10/12/2023    9:48 AM  CBC  WBC 4.0 - 10.5 K/uL 8.1  9.0  11.4   Hemoglobin 13.0 - 17.0 g/dL 16.1  09.6  04.5   Hematocrit 39.0 - 52.0 % 34.3  32.0  32.6   Platelets 150 - 400 K/uL 193  404  172        Latest Ref Rng & Units 11/02/2023   10:23 AM 10/26/2023    9:35 AM 10/12/2023     9:48 AM  CMP  Glucose 70 - 99 mg/dL 79  409  811   BUN 8 - 23 mg/dL 12  7  13    Creatinine 0.61 - 1.24 mg/dL 9.14  7.82  9.56   Sodium 135 - 145 mmol/L 147  141  144   Potassium 3.5 - 5.1 mmol/L 4.0  3.5  3.3   Chloride 98 - 111 mmol/L 113  111  110   CO2 22 - 32 mmol/L 28  23  26    Calcium  8.9 - 10.3 mg/dL 9.9  9.1  8.9   Total Protein 6.5 - 8.1 g/dL 6.8  6.4  6.1   Total Bilirubin 0.0 - 1.2 mg/dL 0.7  0.6  0.9   Alkaline Phos 38 - 126 U/L 110  105  93   AST 15 - 41 U/L 13  18  14    ALT 0 - 44 U/L 19  20  24      Lab Results  Component Value Date   MPROTEIN 0.1 (H) 10/26/2023   MPROTEIN 0.1 (H) 10/05/2023   MPROTEIN Not Observed 08/31/2023   Lab Results  Component Value Date   KPAFRELGTCHN 19.8 (H) 10/26/2023   KPAFRELGTCHN 4.8 10/05/2023   KPAFRELGTCHN 11.2 08/31/2023   LAMBDASER 12.5 10/26/2023   LAMBDASER 3.4 (L) 10/05/2023   LAMBDASER 5.5 (L) 08/31/2023   KAPLAMBRATIO 1.58 10/26/2023   KAPLAMBRATIO 1.41 10/05/2023   KAPLAMBRATIO 2.04 (H) 08/31/2023    RADIOGRAPHIC STUDIES: DG Chest 1 View Result Date: 10/25/2023 CLINICAL DATA:  SUBACUTE PRODUCTIVE COUGH, CANCER PT , UNDER ACTIVE TX EXAM: CHEST  1 VIEW COMPARISON:  Dec 19, 2022 FINDINGS: Low lung volumes. No focal airspace consolidation, pleural effusion, or pneumothorax. No cardiomegaly. No acute fracture. Unchanged cortical irregularity along the posterolateral aspect of the left sixth rib. Corpectomy and lateral fusion of the midthoracic spine. Multilevel degenerative disc disease of the spine. IMPRESSION: Low lung volumes.  Otherwise, no acute cardiopulmonary abnormality. Electronically Signed   By: Rance Burrows M.D.   On: 10/25/2023 12:39     ASSESSMENT & PLAN SEKOU ZUCKERMAN is a 68 y.o.  male with medical history significant for  IgG Lambda Multiple Myeloma who presents for a follow up visit.  # IgG Lambda Multiple Myeloma, Relapsed (ISS Stage II) --findings are most consistent with relapsed multiple  myeloma. Patient previously successfully treated with Velcade /Rev/Dex and Daratumumab /Velcade /Dex. On 07/02/2020 he transitioned to monthly daratumumab  alone.  --due to to rise in M protein, switched to Kyprolis  and Dexamethasone  on 01/06/2022 Plan: --Due for Cycle 23, Day 1 of Kyprolis /Dex today  --Labs show white blood cell count 9.0, hemoglobin 10.9, MCV 82.9, platelets 404. Creatinine and LFTs are in range.  --Most recent myeloma labs from 10/05/2023 showed M protein 0.1 with kappa 4.8, lambda 3.4, ratio 1.41.  --Proceed with treatment today without any dose modifications. --Continue with weekly labs plus Kyprolis /Dex treatment as scheduled  #Anemia: --Likely secondary to MM +/- iron  deficiency --Currently on  PO iron  therapy daily --Will recheck iron  panel today  #Upper respiratory symptoms --No improvement with Z-pack --Suspect allergies, advised to take Zyrtec plus Flonase  # Memory loss: --Encouraged to do memory exercises including puzzles, suduko, etc --If symptoms worsen, we can make a referral to neurology for further evaluation.   #History of Sepsis 2/2 UTI and Fournier's gangrene of scrotum: --Hospitalized from 12/22/2022-01/08/2023. Underwent debridement of necrotic tissue. --Given PIP tazobactam and Zyvox  initially --Urine culture reveals Proteus mirabilis which is resistant to ciprofloxacin  and nitrofurantoin --Blood cultures negative --D/C on remainder doxycycline  and Augmentin  which he completed on 01/07/2023.  --Evaluated by urology on 7/2 and ID on 7/3 with continued wound healing and no further d/c or evidence of infection.   #History of DVT --He had placement of IVC filter, remains on Xarelto . --Due to poor mobility, and lack of bleeding complications, recommend to continue on Xarelto  indefinitely. --caution if Plt count were to drop <50  #Snoring/Risk for OSA: --Diagnosed with OSA and has CPAP machine.   # Supportive Care -- provided patient with an albuterol   inhaler (for use with daratumumab ) --acyclovir  400mg  BID for VZV prophylaxis --zofran  8mg  q8H PRN for nausea/vomiting  --Zometa  is being held in the setting of his prior episode of osteonecrosis of the jaw  No orders of the defined types were placed in this encounter.   All questions were answered. The patient knows to call the clinic with any problems, questions or concerns.  I have spent a total of 30 minutes minutes of face-to-face and non-face-to-face time, preparing to see the patient,  performing a medically appropriate examination, counseling and educating the patient, communicating with other health care professionals, documenting clinical information in the electronic health record,  and care coordination.   Rogerio Clay, MD Department of Hematology/Oncology [facility] at [facility] Phone: [phone] Pager: [phone] Email: [email or web address]   11/09/2023 7:13 AM   Literature Support:  Bringhen S, Mina R, Petrucci MT, Gaidano G, Ballanti S, Musto P, Offidani M, Spada S, Benevolo G, Ponticelli E, Galieni P, Cavo M, Di Toritto TC, Di Raimondo F, Montefusco V, Palumbo A, Boccadoro M, Larocca A. Once-weekly versus twice-weekly carfilzomib  in patients with newly diagnosed multiple myeloma: a pooled analysis of two phase I/II studies. Haematologica. 2019 Aug;104(8):1640-1647.  --Once-weekly 70 mg/m2 carfilzomib  as induction and maintenance therapy for newly diagnosed multiple myeloma patients was as safe and effective as twice-weekly 36 mg/m2 carfilzomib  and provided a more convenient schedule.

## 2023-11-09 NOTE — Patient Instructions (Signed)
 [facility] - A DEPT OF [facility]  Discharge Instructions: Thank you for choosing [facility] to provide your oncology and hematology care.   If you have a lab appointment with the Cancer Center, please go directly to the Cancer Center and check in at the registration area.   Wear comfortable clothing and clothing appropriate for easy access to any Portacath or PICC line.   We strive to give you quality time with your provider. You may need to reschedule your appointment if you arrive late (15 or more minutes).  Arriving late affects you and other patients whose appointments are after yours.  Also, if you miss three or more appointments without notifying the office, you may be dismissed from the clinic at the provider's discretion.      For prescription refill requests, have your pharmacy contact our office and allow 72 hours for refills to be completed.    Today you received the following chemotherapy and/or immunotherapy agents Kyprolis      To help prevent nausea and vomiting after your treatment, we encourage you to take your nausea medication as directed.  BELOW ARE SYMPTOMS THAT SHOULD BE REPORTED IMMEDIATELY: *FEVER GREATER THAN 100.4 F (38 C) OR HIGHER *CHILLS OR SWEATING *NAUSEA AND VOMITING THAT IS NOT CONTROLLED WITH YOUR NAUSEA MEDICATION *UNUSUAL SHORTNESS OF BREATH *UNUSUAL BRUISING OR BLEEDING *URINARY PROBLEMS (pain or burning when urinating, or frequent urination) *BOWEL PROBLEMS (unusual diarrhea, constipation, pain near the anus) TENDERNESS IN MOUTH AND THROAT WITH OR WITHOUT PRESENCE OF ULCERS (sore throat, sores in mouth, or a toothache) UNUSUAL RASH, SWELLING OR PAIN  UNUSUAL VAGINAL DISCHARGE OR ITCHING   Items with * indicate a potential emergency and should be followed up as soon as possible or go to the Emergency Department if any problems should occur.  Please show the CHEMOTHERAPY ALERT CARD or IMMUNOTHERAPY  ALERT CARD at check-in to the Emergency Department and triage nurse.  Should you have questions after your visit or need to cancel or reschedule your appointment, please contact [facility] - A DEPT OF [facility]  Dept: [phone]  and follow the prompts.  Office hours are 8:00 a.m. to 4:30 p.m. Monday - Friday. Please note that voicemails left after 4:00 p.m. may not be returned until the following business day.  We are closed weekends and major holidays. You have access to a nurse at all times for urgent questions. Please call the main number to the clinic Dept: [phone] and follow the prompts.   For any non-urgent questions, you may also contact your provider using MyChart. We now offer e-Visits for anyone 49 and older to request care online for non-urgent symptoms. For details visit [email or web address].   Also download the MyChart app! Go to the app store, search "MyChart", open the app, select [facility] [place], and log in with your MyChart username and password.

## 2023-11-10 ENCOUNTER — Encounter: Payer: Self-pay | Admitting: Hematology and Oncology

## 2023-11-23 ENCOUNTER — Inpatient Hospital Stay: Payer: Medicare (Managed Care)

## 2023-11-23 ENCOUNTER — Inpatient Hospital Stay: Payer: Medicare (Managed Care) | Attending: Physician Assistant

## 2023-11-23 ENCOUNTER — Inpatient Hospital Stay (HOSPITAL_BASED_OUTPATIENT_CLINIC_OR_DEPARTMENT_OTHER): Payer: Medicare (Managed Care) | Admitting: Physician Assistant

## 2023-11-23 VITALS — BP 142/88 | HR 72 | Temp 97.3°F | Resp 17 | Ht 71.0 in | Wt 201.6 lb

## 2023-11-23 DIAGNOSIS — D649 Anemia, unspecified: Secondary | ICD-10-CM | POA: Diagnosis not present

## 2023-11-23 DIAGNOSIS — Z86718 Personal history of other venous thrombosis and embolism: Secondary | ICD-10-CM | POA: Diagnosis not present

## 2023-11-23 DIAGNOSIS — G4733 Obstructive sleep apnea (adult) (pediatric): Secondary | ICD-10-CM | POA: Insufficient documentation

## 2023-11-23 DIAGNOSIS — C9002 Multiple myeloma in relapse: Secondary | ICD-10-CM | POA: Insufficient documentation

## 2023-11-23 DIAGNOSIS — R413 Other amnesia: Secondary | ICD-10-CM | POA: Insufficient documentation

## 2023-11-23 DIAGNOSIS — Z5111 Encounter for antineoplastic chemotherapy: Secondary | ICD-10-CM | POA: Diagnosis present

## 2023-11-23 LAB — CBC WITH DIFFERENTIAL (CANCER CENTER ONLY)
Abs Immature Granulocytes: 0.05 10*3/uL (ref 0.00–0.07)
Basophils Absolute: 0 10*3/uL (ref 0.0–0.1)
Basophils Relative: 1 %
Eosinophils Absolute: 0.3 10*3/uL (ref 0.0–0.5)
Eosinophils Relative: 3 %
HCT: 33.3 % — ABNORMAL LOW (ref 39.0–52.0)
Hemoglobin: 11.2 g/dL — ABNORMAL LOW (ref 13.0–17.0)
Immature Granulocytes: 1 %
Lymphocytes Relative: 13 %
Lymphs Abs: 0.9 10*3/uL (ref 0.7–4.0)
MCH: 27.7 pg (ref 26.0–34.0)
MCHC: 33.6 g/dL (ref 30.0–36.0)
MCV: 82.2 fL (ref 80.0–100.0)
Monocytes Absolute: 0.7 10*3/uL (ref 0.1–1.0)
Monocytes Relative: 10 %
Neutro Abs: 5.5 10*3/uL (ref 1.7–7.7)
Neutrophils Relative %: 72 %
Platelet Count: 397 10*3/uL (ref 150–400)
RBC: 4.05 MIL/uL — ABNORMAL LOW (ref 4.22–5.81)
RDW: 18 % — ABNORMAL HIGH (ref 11.5–15.5)
WBC Count: 7.5 10*3/uL (ref 4.0–10.5)
nRBC: 0 % (ref 0.0–0.2)

## 2023-11-23 LAB — CMP (CANCER CENTER ONLY)
ALT: 15 U/L (ref 0–44)
AST: 14 U/L — ABNORMAL LOW (ref 15–41)
Albumin: 3.9 g/dL (ref 3.5–5.0)
Alkaline Phosphatase: 97 U/L (ref 38–126)
Anion gap: 4 — ABNORMAL LOW (ref 5–15)
BUN: 10 mg/dL (ref 8–23)
CO2: 28 mmol/L (ref 22–32)
Calcium: 9.4 mg/dL (ref 8.9–10.3)
Chloride: 111 mmol/L (ref 98–111)
Creatinine: 0.7 mg/dL (ref 0.61–1.24)
GFR, Estimated: >60 mL/min (ref >60)
Glucose, Bld: 100 mg/dL — ABNORMAL HIGH (ref 70–99)
Potassium: 3.6 mmol/L (ref 3.5–5.1)
Sodium: 143 mmol/L (ref 135–145)
Total Bilirubin: 0.7 mg/dL (ref 0.0–1.2)
Total Protein: 6.6 g/dL (ref 6.5–8.1)

## 2023-11-23 MED ORDER — DEXTROSE 5 % IV SOLN
70.0000 mg/m2 | Freq: Once | INTRAVENOUS | Status: AC
  Administered 2023-11-23: 150 mg via INTRAVENOUS
  Filled 2023-11-23: qty 60

## 2023-11-23 MED ORDER — SODIUM CHLORIDE 0.9 % IV SOLN: Freq: Once | INTRAVENOUS | Status: AC

## 2023-11-23 MED ORDER — SODIUM CHLORIDE 0.9 % IV SOLN
40.0000 mg | Freq: Once | INTRAVENOUS | Status: AC
  Administered 2023-11-23: 40 mg via INTRAVENOUS
  Filled 2023-11-23: qty 4

## 2023-11-23 NOTE — Progress Notes (Signed)
 [facility] [phone]   [phone]  PROGRESS NOTE  Patient Care Team: Dean Every, MD as PCP - General (Family Medicine) Rawland Caddy, MD as Consulting Physician (Physical Medicine and Rehabilitation) [name], PA-C as Physician Assistant (Physical Medicine and Rehabilitation) [name], MD as Consulting Physician (Hematology and Oncology) [name], MD as Consulting Physician (Hematology and Oncology) [name], MD as Consulting Physician (Hematology and Oncology)  Hematological/Oncological History # IgG Lambda Multiple Myeloma, Relapsed (ISS Stage II) 1) 06/2010: initial diagnosis of Multiple Myeloma after T8 compression fracture. Treated with Velcade /Revlimid /Dexamethasone  and achieved a complete remission 2) Velcade  was discontinued in September 2012 and that Revlimid  and Decadron  were discontinued in March 2013. 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 4) Followed by Dr. Marton Sleeper, last clinic visit 10/09/2019. At that time there was concern for relapse of his multiple myeloma.  5) Patient requested transfer to different provider after misunderstanding regarding imaging studies 6) 12/17/2019: transfer care to Dr. [name]  7) 01/09/2020: Cycle 1 Day 1 of Dara/Velcade /Dex 8) 01/21/2020: presented as urgent visit for diarrhea and dehydration. Holding chemotherapy scheduled for 01/23/2020. 9) 01/30/2020: Resume dara/velcade /dex after resolution of diarrhea.  10) 02/13/2020: restaging labs show M protein 0.8, Kappa 4.5, lamba 17.2, ratio 0.26, urine M protein 53 (7.1%). All MM labs indicate improvement.  11) 03/10/2020: Cycle 4 Day 1 of Dara/Velcade /Dex. Transition to q 3 week daratumumab .  12) 06/04/2020:  Cycle 8 Day 1 of Dara/Velcade /Dex 13) 06/25/2020:  Cycle 9 Day 1 of Dara/Velcade /Dex 14) 07/22/2020: Cycle 10 Day 1  of  Dara//Dex 15) 08/18/2020: Cycle 11 Day 1 of Dara//Dex 16) 09/23/2020: Cycle 12 Day 1 of Dara//Dex 17) 10/22/2020: Cycle 13 Day 1 of Dara/Dex  18) 11/19/2020: Cycle 14 Day 1 of Dara/Dex  19) 12/17/2020: Cycle 15 Day 1 of Dara/Dex  20) 01/17/2021: Cycle 16 Day 1 of Dara/Dex  21) 02/11/2021: Cycle 17 Day 1 of Dara/Dex 22) 03/10/2021: Cycle 18 Day 1 of Dara/Dex  23) 04/08/2021: Cycle 19 Day 1 of Dara/Dex  24) 08/03/2021: Cycle 20 Day 1 of Dara/Dex (delayed due to scheduling error) 25) 09/02/2021: Cycle 21 Day 1 of Dara/Dex 26) 09/30/2021: Cycle 22 Day 1 of Dara/Dex 27) 10/28/2021: Cycle 23 Day 1 of Dara/Dex 28) 12/02/2021: Cycle 24 Day 1 of Dara/Dex 29) 12/30/2021: Cycle 25 Day 1 of Dara/Dex 30) 01/06/2022: Cycle 1 Day 1 of Kyprolis /Dex 31) 02/03/2022: Cycle 2 Day 1 of Kyprolis /Dex 32) 02/24/2022: Cycle 3 Day 1 of Kyprolis /Dex 33) 04/07/2022: Cycle 4 Day 1 of Kyprolis /Dex 34) 05/05/2022: Cycle 5 Day 1 of Kyprolis /Dex 35) 06/01/2022: Cycle 6 Day 1 of Kyprolis /Dex 36) 06/29/2022: Cycle 7 Day 1 of Kyprolis /Dex 37) 07/28/2022: Cycle 8 Day 1 of Kyprolis /Dex 38) 08/25/2022: Cycle 9 Day 1 of Kyprolis /Dex 39) 09/22/2022: Cycle 10 Day 1 of Kyprolis /Dex 40) 10/20/2022: Cycle 11 Day 1 of Kyprolis /Dex 41) 11/17/2022: Cycle 12 Day 1 of Kyprolis /Dex 42 ) 12/22/2022-01/08/2023: Admitted for fournier's gangrene of scrotum.  43) 01/26/2023: Cycle 13 Day 1 of Kyprolis /Dex 44) 02/16/2023: Cycle 14 Day 1 of Kyprolis /Dex 45) 03/16/2023:Cycle 15 Day 1 of Kyprolis /Dex 46) 04/13/2023: Cycle 16 Day 1 of Kyprolis /Dex 47) 05/11/2023: Cycle 17 Day 1 of Kyprolis /Dex 48) 06/07/2023: Cycle 18 Day 1 of Kyprolis /Dex 49) 07/06/2023: Cycle 19 Day 1 of Kyprolis /Dex 50) 08/02/2023: Cycle 20 Day 1 of Kyprolis /Dex 51) 08/31/2023: Cycle 21 Day 1 of  Kyprolis /Dex 52) 09/28/2023: Cycle 22 Day 1 of Kyprolis /Dex 53) 10/26/2023: Cycle 23 Day 1 of Kyprolis /Dex 54) 11/23/2023: Cycle 24 Day 1 of Kyprolis /Dex  Interval History:  [name] 68 y.o. male with  medical history significant for  IgG Lambda Multiple Myeloma who presents for a follow up visit. The patient's last visit was on 11/09/2023.  He presents today prior to treatment with Kyprolis /Dex. He is unaccompanied for this visit.   On exam today [name] reports he is doing well without any significant changes to his health.  He still has some congestion that he contributes to allergies.  He is taking antihistamines and decongestions with some improvement.  He is otherwise at his baseline level of health.  He denies any changes to his energy or appetite.  He denies nausea vomiting or bowel habit changes.  He denies easy bruising or signs of active bleeding. He denies fevers, chills, night sweats, shortness of breath, chest pain or cough.  He has no other complaints.  Rest of the 10 point ROS is below.  MEDICAL HISTORY:  Past Medical History:  Diagnosis Date   Adrenal insufficiency (HCC)    on chronic dexamethasone    Anemia    Cancer (HCC)    Coagulopathy (HCC)    on xeralto/ s/p DVT while on coumadin,  IVC in place   Diabetes mellitus without complication (HCC)    type 2   Glaucoma 12/22/2022   Gross hematuria 01/2013   post foley cath procedure   History of blood transfusion 01/2013   Lower GI bleeding 04/18/2017   Multiple myeloma    thoracic T8 with paraplegia s/p resection- on chemo at visit 10/13/10   Multiple myeloma    Multiple myeloma without mention of remission    Neurogenic bladder    Neurogenic bowel    OSA (obstructive sleep apnea) 11/01/2022   Paraplegia (HCC)    Partial small bowel obstruction (HCC) during dec 2011 admission    SURGICAL HISTORY: Past Surgical History:  Procedure Laterality Date   COLONOSCOPY WITH PROPOFOL  N/A 04/12/2017   Procedure: COLONOSCOPY WITH PROPOFOL ;  Surgeon: Tobin Forts, MD;  Location: [facility];  Service: Endoscopy;  Laterality: N/A;   COLONOSCOPY WITH PROPOFOL  N/A 04/19/2017   Procedure: COLONOSCOPY WITH PROPOFOL ;  Surgeon:  Ace Holder, MD;  Location: [facility];  Service: Gastroenterology;  Laterality: N/A;   COLOSTOMY  07/20/2011   Procedure: COLOSTOMY;  Surgeon: Gorman Laughter, DO;  Location: [facility] OR;  Service: General;;   COLOSTOMY REVISION  07/20/2011   Procedure: COLON RESECTION SIGMOID;  Surgeon: Gorman Laughter, DO;  Location: [facility] OR;  Service: General;;   CYSTOSCOPY N/A 04/04/2013   Procedure: CYSTOSCOPY WITH LITHALOPAXY;  Surgeon: Osborn Blaze, MD;  Location: [facility];  Service: Urology;  Laterality: N/A;   INCISION AND DRAINAGE ABSCESS N/A 12/25/2022   Procedure: INCISION AND DRAINAGE OF SCROTUM;  Surgeon: Mallie Seal, MD;  Location: [facility];  Service: Urology;  Laterality: N/A;   INSERTION OF SUPRAPUBIC CATHETER N/A 04/04/2013   Procedure: INSERTION OF SUPRAPUBIC CATHETER;  Surgeon: Osborn Blaze, MD;  Location: [facility];  Service: Urology;  Laterality: N/A;   LAPAROTOMY  07/20/2011   Procedure: EXPLORATORY LAPAROTOMY;  Surgeon: Gorman Laughter, DO;  Location: [facility] OR;  Service: General;  Laterality: N/A;   myeloma thoracic T8 with parpaplegia s/p thoracotomy and thoracic T7-9 cage placement on Dec 26th 2011  07/18/10   SCROTAL EXPLORATION N/A 12/29/2022   Procedure: SCROTAL WOUND DEBRIDEMENT  AND CLOSURE;  Surgeon: Melody Spurling., MD;  Location: [facility];  Service: Urology;  Laterality: N/A;    SOCIAL HISTORY: Social History   Socioeconomic History   Marital status: Married    Spouse name: Not on file   Number of children: Not on file   Years of education: Not on file   Highest education level: Not on file  Occupational History   Not on file  Tobacco Use   Smoking status: Never   Smokeless tobacco: Never  Vaping Use   Vaping status: Never Used  Substance and Sexual Activity   Alcohol use: No   Drug use: No   Sexual activity: Never  Other Topics Concern   Not on file  Social History Narrative   Not on file   Social Drivers of Health   Financial Resource Strain:  Not on file  Food Insecurity: No Food Insecurity (12/22/2022)   Hunger Vital Sign    Worried About Running Out of Food in the Last Year: Never true    Ran Out of Food in the Last Year: Never true  Transportation Needs: No Transportation Needs (12/22/2022)   PRAPARE - Administrator, Civil Service (Medical): No    Lack of Transportation (Non-Medical): No  Physical Activity: Not on file  Stress: Not on file  Social Connections: Not on file  Intimate Partner Violence: Not At Risk (12/22/2022)   Humiliation, Afraid, Rape, and Kick questionnaire    Fear of Current or Ex-Partner: No    Emotionally Abused: No    Physically Abused: No    Sexually Abused: No    FAMILY HISTORY: Family History  Problem Relation Age of Onset   Ovarian cancer Mother    Diabetes Father     ALLERGIES:  is allergic to ferumoxytol .  MEDICATIONS:  Current Outpatient Medications  Medication Sig Dispense Refill   acetaminophen  (TYLENOL ) 325 MG tablet Take 325-650 mg by mouth every 6 (six) hours as needed for headache or mild pain.     acetic acid 0.25 % irrigation Irrigate with 1 Application as directed See admin instructions. Instill 50 ml's, clamp for 10 minutes, then remove clamp and drain the bladder. Repeat 3 times a week- when not using the Renacidin irrigation     acyclovir  (ZOVIRAX ) 400 MG tablet Take 1 tablet (400 mg total) by mouth 2 (two) times daily. 60 tablet 2   albuterol  (VENTOLIN  HFA) 108 (90 Base) MCG/ACT inhaler Inhale 2 puffs into the lungs every 4 (four) hours as needed for wheezing or shortness of breath.     atorvastatin  (LIPITOR) 20 MG tablet Take 20 mg by mouth daily.     azithromycin  (ZITHROMAX  Z-PAK) 250 MG tablet Take 2 pills on Day 1 and 1 pill every day thereafter for a total of 5 days. 6 each 0   BENADRYL  ALLERGY 25 MG tablet Take 25 mg by mouth every 6 (six) hours as needed (for allergic reactions).     BIOFREEZE 4 % GEL Apply 1 application  topically every 4 (four) hours as  needed (for joint pain- to intact areas of the skin only).     Citric Ac-Gluconolact-Mg Carb (RENACIDIN IR) Irrigate with 30 mLs as directed See admin instructions. Instill 30 ml's, clamp for 10 minutes, then remove clamp and drain the bladder. Repeat 3 times a week- when not using the acetic acid irrigation     COSOPT  PF 2-0.5 % SOLN Place 1 drop into both eyes 2 (two) times daily.  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     Emollient (EUCERIN EX) Apply 1 application  topically See admin instructions. Apply to affected area 2 times a day     FLOVENT HFA 110 MCG/ACT inhaler 2 each See admin instructions. 2 sprays to colostomy stoma prior to each colostomy bag change     guaifenesin  (ROBITUSSIN) 100 MG/5ML syrup Take 200 mg by mouth 4 (four) times daily as needed for cough.     iron  polysaccharides (NU-IRON ) 150 MG capsule Take 1 capsule (150 mg total) by mouth daily. 30 capsule 0   latanoprost  (XALATAN ) 0.005 % ophthalmic solution Place 1 drop into both eyes at bedtime.     Multiple Vitamin (MULTIVITAMIN WITH MINERALS) TABS tablet Take 1 tablet by mouth daily. 30 tablet 0   NON FORMULARY Apply 1 application  topically See admin instructions. Colo Plast paste- Apply as directed with each ostomy bag change     ondansetron  (ZOFRAN ) 4 MG tablet Take 1 tablet (4 mg) by mouth every 6 hours as needed for nausea. 20 tablet 0   rivaroxaban  (XARELTO ) 10 MG TABS tablet Take 1 tablet (10 mg total) by mouth daily with supper. (Patient taking differently: Take 10 mg by mouth daily.) 30 tablet 9   Skin Protectants, Misc. (MINERIN CREME) CREA Apply to affected areas 2 times daily as needed. 454 g 0   Zinc  Oxide (BALMEX EX) Apply 1 application  topically See admin instructions. Apply topically daily to affected sites     No current facility-administered medications for this visit.   Facility-Administered Medications Ordered in  Other Visits  Medication Dose Route Frequency Provider Last Rate Last Admin   sodium chloride  flush (NS) 0.9 % injection 10 mL  10 mL Intravenous PRN Marton Sleeper, Ni, MD        REVIEW OF SYSTEMS:   Constitutional: ( - ) fevers, ( - )  chills , ( - ) night sweats Eyes: ( - ) blurriness of vision, ( - ) double vision, ( - ) watery eyes Ears, nose, mouth, throat, and face: ( - ) mucositis, ( - ) sore throat Respiratory: ( - ) cough, ( - ) dyspnea, ( - ) wheezes Cardiovascular: ( - ) palpitation, ( - ) chest discomfort, ( - ) lower extremity swelling Gastrointestinal:  ( - ) nausea, ( - ) heartburn, ( - ) change in bowel habits Skin: ( - ) abnormal skin rashes Lymphatics: ( - ) new lymphadenopathy, ( - ) easy bruising Neurological: ( - ) numbness, ( - ) tingling, ( - ) new weaknesses Behavioral/Psych: ( - ) mood change, ( - ) new changes  All other systems were reviewed with the patient and are negative.  PHYSICAL EXAMINATION: ECOG PERFORMANCE STATUS: paraplegic.   Vitals:   11/23/23 1108 11/23/23 1111  BP: (!) 150/84 (!) 142/88  Pulse: 72   Resp: 17   Temp: (!) 97.3 F (36.3 C)   SpO2: 99%    Filed Weights   11/23/23 1108  Weight: 201 lb 9.6 oz (91.4 kg)     GENERAL: well appearing middle aged [place] American male alert, no distress and comfortable SKIN: skin color, texture, turgor are normal, no rashes or significant lesions EYES: conjunctiva are pink and non-injected, sclera clear LUNGS: clear to auscultation and percussion with normal breathing effort HEART: regular rate & rhythm and no murmurs Musculoskeletal: no cyanosis of digits and  no clubbing  PSYCH: alert & oriented x 3, fluent speech NEURO: paraplegic, no use of LE bilaterally.   LABORATORY DATA:  I have reviewed the data as listed    Latest Ref Rng & Units 11/23/2023   10:31 AM 11/09/2023    1:03 PM 11/02/2023   10:23 AM  CBC  WBC 4.0 - 10.5 K/uL 7.5  9.0  8.1   Hemoglobin 13.0 - 17.0 g/dL 16.1  09.6  04.5    Hematocrit 39.0 - 52.0 % 33.3  31.4  34.3   Platelets 150 - 400 K/uL 397  153  193        Latest Ref Rng & Units 11/23/2023   10:31 AM 11/09/2023    1:03 PM 11/02/2023   10:23 AM  CMP  Glucose 70 - 99 mg/dL 409  87  79   BUN 8 - 23 mg/dL 10  12  12    Creatinine 0.61 - 1.24 mg/dL 8.11  9.14  7.82   Sodium 135 - 145 mmol/L 143  144  147   Potassium 3.5 - 5.1 mmol/L 3.6  3.6  4.0   Chloride 98 - 111 mmol/L 111  111  113   CO2 22 - 32 mmol/L 28  27  28    Calcium  8.9 - 10.3 mg/dL 9.4  9.1  9.9   Total Protein 6.5 - 8.1 g/dL 6.6  6.0  6.8   Total Bilirubin 0.0 - 1.2 mg/dL 0.7  0.7  0.7   Alkaline Phos 38 - 126 U/L 97  95  110   AST 15 - 41 U/L 14  13  13    ALT 0 - 44 U/L 15  18  19      Lab Results  Component Value Date   MPROTEIN 0.1 (H) 10/26/2023   MPROTEIN 0.1 (H) 10/05/2023   MPROTEIN Not Observed 08/31/2023   Lab Results  Component Value Date   KPAFRELGTCHN 19.8 (H) 10/26/2023   KPAFRELGTCHN 4.8 10/05/2023   KPAFRELGTCHN 11.2 08/31/2023   LAMBDASER 12.5 10/26/2023   LAMBDASER 3.4 (L) 10/05/2023   LAMBDASER 5.5 (L) 08/31/2023   KAPLAMBRATIO 1.58 10/26/2023   KAPLAMBRATIO 1.41 10/05/2023   KAPLAMBRATIO 2.04 (H) 08/31/2023    RADIOGRAPHIC STUDIES: DG Chest 1 View Result Date: 10/25/2023 CLINICAL DATA:  SUBACUTE PRODUCTIVE COUGH, CANCER PT , UNDER ACTIVE TX EXAM: CHEST  1 VIEW COMPARISON:  Dec 19, 2022 FINDINGS: Low lung volumes. No focal airspace consolidation, pleural effusion, or pneumothorax. No cardiomegaly. No acute fracture. Unchanged cortical irregularity along the posterolateral aspect of the left sixth rib. Corpectomy and lateral fusion of the midthoracic spine. Multilevel degenerative disc disease of the spine. IMPRESSION: Low lung volumes.  Otherwise, no acute cardiopulmonary abnormality. Electronically Signed   By: Rance Burrows M.D.   On: 10/25/2023 12:39     ASSESSMENT & PLAN NUBAID FEKETE is a 67 y.o.  male with medical history significant for  IgG  Lambda Multiple Myeloma who presents for a follow up visit.  # IgG Lambda Multiple Myeloma, Relapsed (ISS Stage II) --findings are most consistent with relapsed multiple myeloma. Patient previously successfully treated with Velcade /Rev/Dex and Daratumumab /Velcade /Dex. On 07/02/2020 he transitioned to monthly daratumumab  alone.  --due to to rise in M protein, switched to Kyprolis  and Dexamethasone  on 01/06/2022 Plan: --Due for Cycle 24, Day 1 of Kyprolis /Dex today  --Labs show white blood cell count 7.5, hemoglobin 11.2, MCV 82.2, platelets 397. Creatinine and LFTs are in range.  --Most recent myeloma labs from 10/26/2023 showed  M protein 0.1 with kappa 19.8, lambda 12.5, ratio 1.58 --Proceed with treatment today without any dose modifications. --Continue with weekly labs plus Kyprolis /Dex treatment as scheduled  #Anemia: --Likely secondary to MM +/- iron  deficiency --Currently on PO iron  therapy daily --Will recheck iron  panel periodically.   #Upper respiratory symptoms --No improvement with Z-pack --Suspect allergies, advised to take Zyrtec plus Flonase  # Memory loss: --Encouraged to do memory exercises including puzzles, suduko, etc --If symptoms worsen, we can make a referral to neurology for further evaluation.   #History of Sepsis 2/2 UTI and Fournier's gangrene of scrotum: --Hospitalized from 12/22/2022-01/08/2023. Underwent debridement of necrotic tissue. --Given PIP tazobactam and Zyvox  initially --Urine culture reveals Proteus mirabilis which is resistant to ciprofloxacin  and nitrofurantoin --Blood cultures negative --D/C on remainder doxycycline  and Augmentin  which he completed on 01/07/2023.  --Evaluated by urology on 7/2 and ID on 7/3 with continued wound healing and no further d/c or evidence of infection.   #History of DVT --He had placement of IVC filter, remains on Xarelto . --Due to poor mobility, and lack of bleeding complications, recommend to continue on Xarelto   indefinitely. --caution if Plt count were to drop <50  #Snoring/Risk for OSA: --Diagnosed with OSA and has CPAP machine.   # Supportive Care -- provided patient with an albuterol  inhaler (for use with daratumumab ) --acyclovir  400mg  BID for VZV prophylaxis --zofran  8mg  q8H PRN for nausea/vomiting  --Zometa  is being held in the setting of his prior episode of osteonecrosis of the jaw  No orders of the defined types were placed in this encounter.   All questions were answered. The patient knows to call the clinic with any problems, questions or concerns.  I have spent a total of 30 minutes minutes of face-to-face and non-face-to-face time, preparing to see the patient,  performing a medically appropriate examination, counseling and educating the patient, communicating with other health care professionals, documenting clinical information in the electronic health record,  and care coordination.   Wyline Hearing PA-C Dept of Hematology and Oncology [facility] Phone: [phone]    11/23/2023 11:27 AM   Literature Support:  Bringhen S, Mina R, Petrucci MT, Gaidano G, Ballanti S, Musto P, [place], Spada S, Benevolo G, Ponticelli E, Galieni P, Cavo M, Di Toritto TC, Di Raimondo F, Montefusco V, Palumbo A, Boccadoro M, Larocca A. Once-weekly versus twice-weekly carfilzomib  in patients with newly diagnosed multiple myeloma: a pooled analysis of two phase I/II studies. Haematologica. 2019 Aug;104(8):1640-1647.  --Once-weekly 70 mg/m2 carfilzomib  as induction and maintenance therapy for newly diagnosed multiple myeloma patients was as safe and effective as twice-weekly 36 mg/m2 carfilzomib  and provided a more convenient schedule.

## 2023-11-23 NOTE — Patient Instructions (Signed)
 [facility] - A DEPT OF [facility]  Discharge Instructions: Thank you for choosing [facility] to provide your oncology and hematology care.   If you have a lab appointment with the Cancer Center, please go directly to the Cancer Center and check in at the registration area.   Wear comfortable clothing and clothing appropriate for easy access to any Portacath or PICC line.   We strive to give you quality time with your provider. You may need to reschedule your appointment if you arrive late (15 or more minutes).  Arriving late affects you and other patients whose appointments are after yours.  Also, if you miss three or more appointments without notifying the office, you may be dismissed from the clinic at the provider's discretion.      For prescription refill requests, have your pharmacy contact our office and allow 72 hours for refills to be completed.    Today you received the following chemotherapy and/or immunotherapy agents Kyprolis      To help prevent nausea and vomiting after your treatment, we encourage you to take your nausea medication as directed.  BELOW ARE SYMPTOMS THAT SHOULD BE REPORTED IMMEDIATELY: *FEVER GREATER THAN 100.4 F (38 C) OR HIGHER *CHILLS OR SWEATING *NAUSEA AND VOMITING THAT IS NOT CONTROLLED WITH YOUR NAUSEA MEDICATION *UNUSUAL SHORTNESS OF BREATH *UNUSUAL BRUISING OR BLEEDING *URINARY PROBLEMS (pain or burning when urinating, or frequent urination) *BOWEL PROBLEMS (unusual diarrhea, constipation, pain near the anus) TENDERNESS IN MOUTH AND THROAT WITH OR WITHOUT PRESENCE OF ULCERS (sore throat, sores in mouth, or a toothache) UNUSUAL RASH, SWELLING OR PAIN  UNUSUAL VAGINAL DISCHARGE OR ITCHING   Items with * indicate a potential emergency and should be followed up as soon as possible or go to the Emergency Department if any problems should occur.  Please show the CHEMOTHERAPY ALERT CARD or IMMUNOTHERAPY  ALERT CARD at check-in to the Emergency Department and triage nurse.  Should you have questions after your visit or need to cancel or reschedule your appointment, please contact [facility] - A DEPT OF [facility]  Dept: [phone]  and follow the prompts.  Office hours are 8:00 a.m. to 4:30 p.m. Monday - Friday. Please note that voicemails left after 4:00 p.m. may not be returned until the following business day.  We are closed weekends and major holidays. You have access to a nurse at all times for urgent questions. Please call the main number to the clinic Dept: [phone] and follow the prompts.   For any non-urgent questions, you may also contact your provider using MyChart. We now offer e-Visits for anyone 49 and older to request care online for non-urgent symptoms. For details visit [email or web address].   Also download the MyChart app! Go to the app store, search "MyChart", open the app, select [facility] [place], and log in with your MyChart username and password.

## 2023-11-26 LAB — MULTIPLE MYELOMA PANEL, SERUM
Albumin SerPl Elph-Mcnc: 3.2 g/dL (ref 2.9–4.4)
Albumin/Glob SerPl: 1.3 (ref 0.7–1.7)
Alpha 1: 0.3 g/dL (ref 0.0–0.4)
Alpha2 Glob SerPl Elph-Mcnc: 0.8 g/dL (ref 0.4–1.0)
B-Globulin SerPl Elph-Mcnc: 1 g/dL (ref 0.7–1.3)
Gamma Glob SerPl Elph-Mcnc: 0.5 g/dL (ref 0.4–1.8)
Globulin, Total: 2.6 g/dL (ref 2.2–3.9)
IgA: 82 mg/dL (ref 61–437)
IgG (Immunoglobin G), Serum: 657 mg/dL (ref 603–1613)
IgM (Immunoglobulin M), Srm: 26 mg/dL (ref 20–172)
Total Protein ELP: 5.8 g/dL — ABNORMAL LOW (ref 6.0–8.5)

## 2023-11-26 LAB — KAPPA/LAMBDA LIGHT CHAINS
Kappa free light chain: 11.7 mg/L (ref 3.3–19.4)
Kappa, lambda light chain ratio: 1.8 — ABNORMAL HIGH (ref 0.26–1.65)
Lambda free light chains: 6.5 mg/L (ref 5.7–26.3)

## 2023-11-30 ENCOUNTER — Inpatient Hospital Stay: Payer: Medicare (Managed Care)

## 2023-11-30 VITALS — BP 159/81 | HR 63 | Temp 97.9°F | Resp 16

## 2023-11-30 DIAGNOSIS — C9002 Multiple myeloma in relapse: Secondary | ICD-10-CM

## 2023-11-30 DIAGNOSIS — Z5111 Encounter for antineoplastic chemotherapy: Secondary | ICD-10-CM | POA: Diagnosis not present

## 2023-11-30 LAB — CMP (CANCER CENTER ONLY)
ALT: 13 U/L (ref 0–44)
AST: 10 U/L — ABNORMAL LOW (ref 15–41)
Albumin: 3.7 g/dL (ref 3.5–5.0)
Alkaline Phosphatase: 95 U/L (ref 38–126)
Anion gap: 4 — ABNORMAL LOW (ref 5–15)
BUN: 14 mg/dL (ref 8–23)
CO2: 28 mmol/L (ref 22–32)
Calcium: 9.2 mg/dL (ref 8.9–10.3)
Chloride: 113 mmol/L — ABNORMAL HIGH (ref 98–111)
Creatinine: 0.75 mg/dL (ref 0.61–1.24)
GFR, Estimated: >60 mL/min (ref >60)
Glucose, Bld: 90 mg/dL (ref 70–99)
Potassium: 3.6 mmol/L (ref 3.5–5.1)
Sodium: 145 mmol/L (ref 135–145)
Total Bilirubin: 0.8 mg/dL (ref 0.0–1.2)
Total Protein: 6.1 g/dL — ABNORMAL LOW (ref 6.5–8.1)

## 2023-11-30 LAB — CBC WITH DIFFERENTIAL (CANCER CENTER ONLY)
Abs Immature Granulocytes: 0.05 10*3/uL (ref 0.00–0.07)
Basophils Absolute: 0 10*3/uL (ref 0.0–0.1)
Basophils Relative: 0 %
Eosinophils Absolute: 0.3 10*3/uL (ref 0.0–0.5)
Eosinophils Relative: 4 %
HCT: 32.9 % — ABNORMAL LOW (ref 39.0–52.0)
Hemoglobin: 11.3 g/dL — ABNORMAL LOW (ref 13.0–17.0)
Immature Granulocytes: 1 %
Lymphocytes Relative: 12 %
Lymphs Abs: 0.9 10*3/uL (ref 0.7–4.0)
MCH: 27.9 pg (ref 26.0–34.0)
MCHC: 34.3 g/dL (ref 30.0–36.0)
MCV: 81.2 fL (ref 80.0–100.0)
Monocytes Absolute: 0.7 10*3/uL (ref 0.1–1.0)
Monocytes Relative: 9 %
Neutro Abs: 5.3 10*3/uL (ref 1.7–7.7)
Neutrophils Relative %: 74 %
Platelet Count: 174 10*3/uL (ref 150–400)
RBC: 4.05 MIL/uL — ABNORMAL LOW (ref 4.22–5.81)
RDW: 18.4 % — ABNORMAL HIGH (ref 11.5–15.5)
WBC Count: 7.2 10*3/uL (ref 4.0–10.5)
nRBC: 0 % (ref 0.0–0.2)

## 2023-11-30 MED ORDER — DEXTROSE 5 % IV SOLN
70.0000 mg/m2 | Freq: Once | INTRAVENOUS | Status: AC
  Administered 2023-11-30: 150 mg via INTRAVENOUS
  Filled 2023-11-30: qty 60

## 2023-11-30 MED ORDER — SODIUM CHLORIDE 0.9 % IV SOLN
40.0000 mg | Freq: Once | INTRAVENOUS | Status: AC
  Administered 2023-11-30: 40 mg via INTRAVENOUS
  Filled 2023-11-30: qty 4

## 2023-11-30 MED ORDER — SODIUM CHLORIDE 0.9 % IV SOLN: Freq: Once | INTRAVENOUS | Status: AC

## 2023-11-30 NOTE — Patient Instructions (Signed)
 [facility] - A DEPT OF [facility]  Discharge Instructions: Thank you for choosing [facility] to provide your oncology and hematology care.   If you have a lab appointment with the Cancer Center, please go directly to the Cancer Center and check in at the registration area.   Wear comfortable clothing and clothing appropriate for easy access to any Portacath or PICC line.   We strive to give you quality time with your provider. You may need to reschedule your appointment if you arrive late (15 or more minutes).  Arriving late affects you and other patients whose appointments are after yours.  Also, if you miss three or more appointments without notifying the office, you may be dismissed from the clinic at the provider's discretion.      For prescription refill requests, have your pharmacy contact our office and allow 72 hours for refills to be completed.    Today you received the following chemotherapy and/or immunotherapy agents: carfilzomib      To help prevent nausea and vomiting after your treatment, we encourage you to take your nausea medication as directed.  BELOW ARE SYMPTOMS THAT SHOULD BE REPORTED IMMEDIATELY: *FEVER GREATER THAN 100.4 F (38 C) OR HIGHER *CHILLS OR SWEATING *NAUSEA AND VOMITING THAT IS NOT CONTROLLED WITH YOUR NAUSEA MEDICATION *UNUSUAL SHORTNESS OF BREATH *UNUSUAL BRUISING OR BLEEDING *URINARY PROBLEMS (pain or burning when urinating, or frequent urination) *BOWEL PROBLEMS (unusual diarrhea, constipation, pain near the anus) TENDERNESS IN MOUTH AND THROAT WITH OR WITHOUT PRESENCE OF ULCERS (sore throat, sores in mouth, or a toothache) UNUSUAL RASH, SWELLING OR PAIN  UNUSUAL VAGINAL DISCHARGE OR ITCHING   Items with * indicate a potential emergency and should be followed up as soon as possible or go to the Emergency Department if any problems should occur.  Please show the CHEMOTHERAPY ALERT CARD or  IMMUNOTHERAPY ALERT CARD at check-in to the Emergency Department and triage nurse.  Should you have questions after your visit or need to cancel or reschedule your appointment, please contact [facility] - A DEPT OF [facility]  Dept: [phone]  and follow the prompts.  Office hours are 8:00 a.m. to 4:30 p.m. Monday - Friday. Please note that voicemails left after 4:00 p.m. may not be returned until the following business day.  We are closed weekends and major holidays. You have access to a nurse at all times for urgent questions. Please call the main number to the clinic Dept: [phone] and follow the prompts.   For any non-urgent questions, you may also contact your provider using MyChart. We now offer e-Visits for anyone 73 and older to request care online for non-urgent symptoms. For details visit [email or web address].   Also download the MyChart app! Go to the app store, search "MyChart", open the app, select [place], and log in with your MyChart username and password.

## 2023-12-07 ENCOUNTER — Inpatient Hospital Stay: Payer: Medicare (Managed Care)

## 2023-12-07 ENCOUNTER — Inpatient Hospital Stay (HOSPITAL_BASED_OUTPATIENT_CLINIC_OR_DEPARTMENT_OTHER): Payer: Medicare (Managed Care) | Admitting: Physician Assistant

## 2023-12-07 VITALS — BP 136/90 | HR 74 | Temp 97.2°F | Resp 17 | Ht 71.0 in | Wt 248.1 lb

## 2023-12-07 VITALS — BP 157/82 | HR 70 | Resp 18

## 2023-12-07 DIAGNOSIS — C9002 Multiple myeloma in relapse: Secondary | ICD-10-CM | POA: Diagnosis not present

## 2023-12-07 DIAGNOSIS — Z5111 Encounter for antineoplastic chemotherapy: Secondary | ICD-10-CM | POA: Diagnosis not present

## 2023-12-07 LAB — CMP (CANCER CENTER ONLY)
ALT: 15 U/L (ref 0–44)
AST: 10 U/L — ABNORMAL LOW (ref 15–41)
Albumin: 3.7 g/dL (ref 3.5–5.0)
Alkaline Phosphatase: 91 U/L (ref 38–126)
Anion gap: 9 (ref 5–15)
BUN: 16 mg/dL (ref 8–23)
CO2: 24 mmol/L (ref 22–32)
Calcium: 9.3 mg/dL (ref 8.9–10.3)
Chloride: 113 mmol/L — ABNORMAL HIGH (ref 98–111)
Creatinine: 0.78 mg/dL (ref 0.61–1.24)
GFR, Estimated: >60 mL/min (ref >60)
Glucose, Bld: 107 mg/dL — ABNORMAL HIGH (ref 70–99)
Potassium: 3.7 mmol/L (ref 3.5–5.1)
Sodium: 146 mmol/L — ABNORMAL HIGH (ref 135–145)
Total Bilirubin: 0.6 mg/dL (ref 0.0–1.2)
Total Protein: 6.1 g/dL — ABNORMAL LOW (ref 6.5–8.1)

## 2023-12-07 LAB — CBC WITH DIFFERENTIAL (CANCER CENTER ONLY)
Abs Immature Granulocytes: 0.06 10*3/uL (ref 0.00–0.07)
Basophils Absolute: 0 10*3/uL (ref 0.0–0.1)
Basophils Relative: 0 %
Eosinophils Absolute: 0.3 10*3/uL (ref 0.0–0.5)
Eosinophils Relative: 3 %
HCT: 32.4 % — ABNORMAL LOW (ref 39.0–52.0)
Hemoglobin: 11.3 g/dL — ABNORMAL LOW (ref 13.0–17.0)
Immature Granulocytes: 1 %
Lymphocytes Relative: 11 %
Lymphs Abs: 1 10*3/uL (ref 0.7–4.0)
MCH: 28.1 pg (ref 26.0–34.0)
MCHC: 34.9 g/dL (ref 30.0–36.0)
MCV: 80.6 fL (ref 80.0–100.0)
Monocytes Absolute: 0.9 10*3/uL (ref 0.1–1.0)
Monocytes Relative: 10 %
Neutro Abs: 6.8 10*3/uL (ref 1.7–7.7)
Neutrophils Relative %: 75 %
Platelet Count: 170 10*3/uL (ref 150–400)
RBC: 4.02 MIL/uL — ABNORMAL LOW (ref 4.22–5.81)
RDW: 18.2 % — ABNORMAL HIGH (ref 11.5–15.5)
WBC Count: 9 10*3/uL (ref 4.0–10.5)
nRBC: 0 % (ref 0.0–0.2)

## 2023-12-07 MED ORDER — SODIUM CHLORIDE 0.9 % IV SOLN: Freq: Once | INTRAVENOUS | Status: AC

## 2023-12-07 MED ORDER — DEXTROSE 5 % IV SOLN
70.0000 mg/m2 | Freq: Once | INTRAVENOUS | Status: AC
  Administered 2023-12-07: 150 mg via INTRAVENOUS
  Filled 2023-12-07: qty 60

## 2023-12-07 MED ORDER — SODIUM CHLORIDE 0.9 % IV SOLN
40.0000 mg | Freq: Once | INTRAVENOUS | Status: AC
  Administered 2023-12-07: 40 mg via INTRAVENOUS
  Filled 2023-12-07: qty 4

## 2023-12-07 NOTE — Progress Notes (Signed)
 [facility] [phone]   [phone]  PROGRESS NOTE  Patient Care Team: Gary Every, MD as PCP - General (Family Medicine) Gary Caddy, MD as Consulting Physician (Physical Medicine and Rehabilitation) Gary Baldy Everlyn Hockey, PA-C as Physician Assistant (Physical Medicine and Rehabilitation) Gary Bame, MD as Consulting Physician (Hematology and Oncology) Gary Bame, MD as Consulting Physician (Hematology and Oncology) Gary Bame, MD as Consulting Physician (Hematology and Oncology)  Hematological/Oncological History # IgG Lambda Multiple Myeloma, Relapsed (ISS Stage II) 1) 06/2010: initial diagnosis of Multiple Myeloma after T8 compression fracture. Treated with Velcade /Revlimid /Dexamethasone  and achieved a complete remission 2) Velcade  was discontinued in September 2012 and that Revlimid  and Decadron  were discontinued in March 2013. 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 4) Followed by Dr. Marton Howell, last clinic visit 10/09/2019. At that time there was concern for relapse of his multiple myeloma.  5) Patient requested transfer to different provider after misunderstanding regarding imaging studies 6) 12/17/2019: transfer care to Dr. Rosaline Howell  7) 01/09/2020: Cycle 1 Day 1 of Dara/Velcade /Dex 8) 01/21/2020: presented as urgent visit for diarrhea and dehydration. Holding chemotherapy scheduled for 01/23/2020. 9) 01/30/2020: Resume dara/velcade /dex after resolution of diarrhea.  10) 02/13/2020: restaging labs show M protein 0.8, Kappa 4.5, lamba 17.2, ratio 0.26, urine M protein 53 (7.1%). All MM labs indicate improvement.  11) 03/10/2020: Cycle 4 Day 1 of Dara/Velcade /Dex. Transition to q 3 week daratumumab .  12) 06/04/2020:  Cycle 8 Day 1 of Dara/Velcade /Dex 13) 06/25/2020:  Cycle 9 Day 1 of Dara/Velcade /Dex 14) 07/22/2020: Cycle 10 Day 1  of  Dara//Dex 15) 08/18/2020: Cycle 11 Day 1 of Dara//Dex 16) 09/23/2020: Cycle 12 Day 1 of Dara//Dex 17) 10/22/2020: Cycle 13 Day 1 of Dara/Dex  18) 11/19/2020: Cycle 14 Day 1 of Dara/Dex  19) 12/17/2020: Cycle 15 Day 1 of Dara/Dex  20) 01/17/2021: Cycle 16 Day 1 of Dara/Dex  21) 02/11/2021: Cycle 17 Day 1 of Dara/Dex 22) 03/10/2021: Cycle 18 Day 1 of Dara/Dex  23) 04/08/2021: Cycle 19 Day 1 of Dara/Dex  24) 08/03/2021: Cycle 20 Day 1 of Dara/Dex (delayed due to scheduling error) 25) 09/02/2021: Cycle 21 Day 1 of Dara/Dex 26) 09/30/2021: Cycle 22 Day 1 of Dara/Dex 27) 10/28/2021: Cycle 23 Day 1 of Dara/Dex 28) 12/02/2021: Cycle 24 Day 1 of Dara/Dex 29) 12/30/2021: Cycle 25 Day 1 of Dara/Dex 30) 01/06/2022: Cycle 1 Day 1 of Kyprolis /Dex 31) 02/03/2022: Cycle 2 Day 1 of Kyprolis /Dex 32) 02/24/2022: Cycle 3 Day 1 of Kyprolis /Dex 33) 04/07/2022: Cycle 4 Day 1 of Kyprolis /Dex 34) 05/05/2022: Cycle 5 Day 1 of Kyprolis /Dex 35) 06/01/2022: Cycle 6 Day 1 of Kyprolis /Dex 36) 06/29/2022: Cycle 7 Day 1 of Kyprolis /Dex 37) 07/28/2022: Cycle 8 Day 1 of Kyprolis /Dex 38) 08/25/2022: Cycle 9 Day 1 of Kyprolis /Dex 39) 09/22/2022: Cycle 10 Day 1 of Kyprolis /Dex 40) 10/20/2022: Cycle 11 Day 1 of Kyprolis /Dex 41) 11/17/2022: Cycle 12 Day 1 of Kyprolis /Dex 42 ) 12/22/2022-01/08/2023: Admitted for fournier's gangrene of scrotum.  43) 01/26/2023: Cycle 13 Day 1 of Kyprolis /Dex 44) 02/16/2023: Cycle 14 Day 1 of Kyprolis /Dex 45) 03/16/2023:Cycle 15 Day 1 of Kyprolis /Dex 46) 04/13/2023: Cycle 16 Day 1 of Kyprolis /Dex 47) 05/11/2023: Cycle 17 Day 1 of Kyprolis /Dex 48) 06/07/2023: Cycle 18 Day 1 of Kyprolis /Dex 49) 07/06/2023: Cycle 19 Day 1 of Kyprolis /Dex 50) 08/02/2023: Cycle 20 Day 1 of Kyprolis /Dex 51) 08/31/2023: Cycle 21 Day 1 of  Kyprolis /Dex 52) 09/28/2023: Cycle 22 Day 1 of Kyprolis /Dex 53) 10/26/2023: Cycle 23 Day 1 of Kyprolis /Dex 54) 11/23/2023: Cycle 24 Day 1 of Kyprolis /Dex  Interval History:  [name] 68 y.o. male with  medical history significant for  IgG Lambda Multiple Myeloma who presents for a follow up visit. The patient's last visit was on 11/23/2023.  He presents today prior to treatment with Kyprolis /Dex. He is unaccompanied for this visit.   On exam today [name] reports he is doing well without any significant changes to his health.  He reports his energy and appetite are overall stable.  He denies nausea, vomiting or any pain/discomfort.  He has good bowel movements without any concerns or diarrhea or constipation.  He denies easy bruising or signs of active bleeding. He denies fevers, chills, night sweats, shortness of breath, chest pain or cough.  He has no other complaints.  Rest of the 10 point ROS is below.  MEDICAL HISTORY:  Past Medical History:  Diagnosis Date   Adrenal insufficiency (HCC)    on chronic dexamethasone    Anemia    Cancer (HCC)    Coagulopathy (HCC)    on xeralto/ s/p DVT while on coumadin,  IVC in place   Diabetes mellitus without complication (HCC)    type 2   Glaucoma 12/22/2022   Gross hematuria 01/2013   post foley cath procedure   History of blood transfusion 01/2013   Lower GI bleeding 04/18/2017   Multiple myeloma    thoracic T8 with paraplegia s/p resection- on chemo at visit 10/13/10   Multiple myeloma    Multiple myeloma without mention of remission    Neurogenic bladder    Neurogenic bowel    OSA (obstructive sleep apnea) 11/01/2022   Paraplegia (HCC)    Partial small bowel obstruction (HCC) during dec 2011 admission    SURGICAL HISTORY: Past Surgical History:  Procedure Laterality Date   COLONOSCOPY WITH PROPOFOL  N/A 04/12/2017   Procedure: COLONOSCOPY WITH PROPOFOL ;  Surgeon: Tobin Forts, MD;  Location: [facility];  Service: Endoscopy;  Laterality: N/A;   COLONOSCOPY WITH PROPOFOL  N/A 04/19/2017   Procedure: COLONOSCOPY WITH PROPOFOL ;  Surgeon: Ace Holder, MD;  Location: [facility];  Service: Gastroenterology;  Laterality: N/A;    COLOSTOMY  07/20/2011   Procedure: COLOSTOMY;  Surgeon: Gorman Laughter, DO;  Location: [facility] OR;  Service: General;;   COLOSTOMY REVISION  07/20/2011   Procedure: COLON RESECTION SIGMOID;  Surgeon: Gorman Laughter, DO;  Location: [facility] OR;  Service: General;;   CYSTOSCOPY N/A 04/04/2013   Procedure: CYSTOSCOPY WITH LITHALOPAXY;  Surgeon: Osborn Blaze, MD;  Location: [facility];  Service: Urology;  Laterality: N/A;   INCISION AND DRAINAGE ABSCESS N/A 12/25/2022   Procedure: INCISION AND DRAINAGE OF SCROTUM;  Surgeon: Mallie Seal, MD;  Location: [facility];  Service: Urology;  Laterality: N/A;   INSERTION OF SUPRAPUBIC CATHETER N/A 04/04/2013   Procedure: INSERTION OF SUPRAPUBIC CATHETER;  Surgeon: Osborn Blaze, MD;  Location: [facility];  Service: Urology;  Laterality: N/A;   LAPAROTOMY  07/20/2011   Procedure: EXPLORATORY LAPAROTOMY;  Surgeon: Gorman Laughter, DO;  Location: [facility] OR;  Service: General;  Laterality: N/A;   myeloma thoracic T8 with parpaplegia s/p thoracotomy and thoracic T7-9 cage placement on Dec 26th 2011  07/18/10   SCROTAL EXPLORATION N/A 12/29/2022   Procedure: SCROTAL WOUND DEBRIDEMENT AND CLOSURE;  Surgeon: Melody Spurling., MD;  Location: [facility];  Service: Urology;  Laterality: N/A;  SOCIAL HISTORY: Social History   Socioeconomic History   Marital status: Married    Spouse name: Not on file   Number of children: Not on file   Years of education: Not on file   Highest education level: Not on file  Occupational History   Not on file  Tobacco Use   Smoking status: Never   Smokeless tobacco: Never  Vaping Use   Vaping status: Never Used  Substance and Sexual Activity   Alcohol use: No   Drug use: No   Sexual activity: Never  Other Topics Concern   Not on file  Social History Narrative   Not on file   Social Drivers of Health   Financial Resource Strain: Not on file  Food Insecurity: No Food Insecurity (12/22/2022)   Hunger Vital Sign    Worried About  Running Out of Food in the Last Year: Never true    Ran Out of Food in the Last Year: Never true  Transportation Needs: No Transportation Needs (12/22/2022)   PRAPARE - Administrator, Civil Service (Medical): No    Lack of Transportation (Non-Medical): No  Physical Activity: Not on file  Stress: Not on file  Social Connections: Not on file  Intimate Partner Violence: Not At Risk (12/22/2022)   Humiliation, Afraid, Rape, and Kick questionnaire    Fear of Current or Ex-Partner: No    Emotionally Abused: No    Physically Abused: No    Sexually Abused: No    FAMILY HISTORY: Family History  Problem Relation Age of Onset   Ovarian cancer Mother    Diabetes Father     ALLERGIES:  is allergic to ferumoxytol .  MEDICATIONS:  Current Outpatient Medications  Medication Sig Dispense Refill   acetaminophen  (TYLENOL ) 325 MG tablet Take 325-650 mg by mouth Howell 6 (six) hours as needed for headache or mild pain.     acetic acid 0.25 % irrigation Irrigate with 1 Application as directed See admin instructions. Instill 50 ml's, clamp for 10 minutes, then remove clamp and drain the bladder. Repeat 3 times a week- when not using the Renacidin irrigation     acyclovir  (ZOVIRAX ) 400 MG tablet Take 1 tablet (400 mg total) by mouth 2 (two) times daily. 60 tablet 2   albuterol  (VENTOLIN  HFA) 108 (90 Base) MCG/ACT inhaler Inhale 2 puffs into the lungs Howell 4 (four) hours as needed for wheezing or shortness of breath.     atorvastatin  (LIPITOR) 20 MG tablet Take 20 mg by mouth daily.     BENADRYL  ALLERGY 25 MG tablet Take 25 mg by mouth Howell 6 (six) hours as needed (for allergic reactions).     BIOFREEZE 4 % GEL Apply 1 application  topically Howell 4 (four) hours as needed (for joint pain- to intact areas of the skin only).     Citric Ac-Gluconolact-Mg Carb (RENACIDIN IR) Irrigate with 30 mLs as directed See admin instructions. Instill 30 ml's, clamp for 10 minutes, then remove clamp and drain  the bladder. Repeat 3 times a week- when not using the acetic acid irrigation     COSOPT  PF 2-0.5 % SOLN Place 1 drop into both eyes 2 (two) times daily.     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     Emollient (EUCERIN EX) Apply 1 application  topically See admin instructions. Apply to affected area 2 times a day  FLOVENT HFA 110 MCG/ACT inhaler 2 each See admin instructions. 2 sprays to colostomy stoma prior to each colostomy bag change     guaifenesin  (ROBITUSSIN) 100 MG/5ML syrup Take 200 mg by mouth 4 (four) times daily as needed for cough.     iron  polysaccharides (NU-IRON ) 150 MG capsule Take 1 capsule (150 mg total) by mouth daily. 30 capsule 0   latanoprost  (XALATAN ) 0.005 % ophthalmic solution Place 1 drop into both eyes at bedtime.     Multiple Vitamin (MULTIVITAMIN WITH MINERALS) TABS tablet Take 1 tablet by mouth daily. 30 tablet 0   NON FORMULARY Apply 1 application  topically See admin instructions. Colo Plast paste- Apply as directed with each ostomy bag change     ondansetron  (ZOFRAN ) 4 MG tablet Take 1 tablet (4 mg) by mouth Howell 6 hours as needed for nausea. 20 tablet 0   rivaroxaban  (XARELTO ) 10 MG TABS tablet Take 1 tablet (10 mg total) by mouth daily with supper. (Patient taking differently: Take 10 mg by mouth daily.) 30 tablet 9   Skin Protectants, Misc. (MINERIN CREME) CREA Apply to affected areas 2 times daily as needed. 454 g 0   Zinc  Oxide (BALMEX EX) Apply 1 application  topically See admin instructions. Apply topically daily to affected sites     No current facility-administered medications for this visit.   Facility-Administered Medications Ordered in Other Visits  Medication Dose Route Frequency Provider Last Rate Last Admin   sodium chloride  flush (NS) 0.9 % injection 10 mL  10 mL Intravenous PRN [name], Ni, MD        REVIEW OF SYSTEMS:   Constitutional: ( - ) fevers, ( -  )  chills , ( - ) night sweats Eyes: ( - ) blurriness of vision, ( - ) double vision, ( - ) watery eyes Ears, nose, mouth, throat, and face: ( - ) mucositis, ( - ) sore throat Respiratory: ( - ) cough, ( - ) dyspnea, ( - ) wheezes Cardiovascular: ( - ) palpitation, ( - ) chest discomfort, ( - ) lower extremity swelling Gastrointestinal:  ( - ) nausea, ( - ) heartburn, ( - ) change in bowel habits Skin: ( - ) abnormal skin rashes Lymphatics: ( - ) new lymphadenopathy, ( - ) easy bruising Neurological: ( - ) numbness, ( - ) tingling, ( - ) new weaknesses Behavioral/Psych: ( - ) mood change, ( - ) new changes  All other systems were reviewed with the patient and are negative.  PHYSICAL EXAMINATION: ECOG PERFORMANCE STATUS: paraplegic.   Vitals:   12/07/23 1116 12/07/23 1120  BP: (!) 153/84 (!) 136/90  Pulse: 74   Resp: 17   Temp: (!) 97.2 F (36.2 C)   SpO2: 100%    Filed Weights   12/07/23 1116  Weight: 248 lb 1.6 oz (112.5 kg)     GENERAL: well appearing middle aged [place] American male alert, no distress and comfortable SKIN: skin color, texture, turgor are normal, no rashes or significant lesions EYES: conjunctiva are pink and non-injected, sclera clear LUNGS: clear to auscultation and percussion with normal breathing effort HEART: regular rate & rhythm and no murmurs Musculoskeletal: no cyanosis of digits and no clubbing  PSYCH: alert & oriented x 3, fluent speech NEURO: paraplegic, no use of LE bilaterally.   LABORATORY DATA:  I have reviewed the data as listed    Latest Ref Rng & Units 12/07/2023    9:40 AM 11/30/2023    9:39 AM  11/23/2023   10:31 AM  CBC  WBC 4.0 - 10.5 K/uL 9.0  7.2  7.5   Hemoglobin 13.0 - 17.0 g/dL 08.6  57.8  46.9   Hematocrit 39.0 - 52.0 % 32.4  32.9  33.3   Platelets 150 - 400 K/uL 170  174  397        Latest Ref Rng & Units 12/07/2023    9:40 AM 11/30/2023    9:39 AM 11/23/2023   10:31 AM  CMP  Glucose 70 - 99 mg/dL 629  90  528   BUN 8  - 23 mg/dL 16  14  10    Creatinine 0.61 - 1.24 mg/dL 4.13  2.44  0.10   Sodium 135 - 145 mmol/L 146  145  143   Potassium 3.5 - 5.1 mmol/L 3.7  3.6  3.6   Chloride 98 - 111 mmol/L 113  113  111   CO2 22 - 32 mmol/L 24  28  28    Calcium  8.9 - 10.3 mg/dL 9.3  9.2  9.4   Total Protein 6.5 - 8.1 g/dL 6.1  6.1  6.6   Total Bilirubin 0.0 - 1.2 mg/dL 0.6  0.8  0.7   Alkaline Phos 38 - 126 U/L 91  95  97   AST 15 - 41 U/L 10  10  14    ALT 0 - 44 U/L 15  13  15      Lab Results  Component Value Date   MPROTEIN Not Observed 11/23/2023   MPROTEIN 0.1 (H) 10/26/2023   MPROTEIN 0.1 (H) 10/05/2023   Lab Results  Component Value Date   KPAFRELGTCHN 11.7 11/23/2023   KPAFRELGTCHN 19.8 (H) 10/26/2023   KPAFRELGTCHN 4.8 10/05/2023   LAMBDASER 6.5 11/23/2023   LAMBDASER 12.5 10/26/2023   LAMBDASER 3.4 (L) 10/05/2023   KAPLAMBRATIO 1.80 (H) 11/23/2023   KAPLAMBRATIO 1.58 10/26/2023   KAPLAMBRATIO 1.41 10/05/2023    RADIOGRAPHIC STUDIES: No results found.    ASSESSMENT & PLAN [name] is a 68 y.o.  male with medical history significant for  IgG Lambda Multiple Myeloma who presents for a follow up visit.  # IgG Lambda Multiple Myeloma, Relapsed (ISS Stage II) --findings are most consistent with relapsed multiple myeloma. Patient previously successfully treated with Velcade /Rev/Dex and Daratumumab /Velcade /Dex. On 07/02/2020 he transitioned to monthly daratumumab  alone.  --due to to rise in M protein, switched to Kyprolis  and Dexamethasone  on 01/06/2022 Plan: --Due for Cycle 24, Day 15 of Kyprolis /Dex today  --Labs show white blood cell count 9.0, hemoglobin 11.3, MCV 80.6, platelets 170. Creatinine and LFTs are in range.  --Most recent myeloma labs from 11/23/2023 showed M protein not detectable with kappa improved from 19.8 to 11.7, lambda light chain 6.5, ratio 1.80. --Proceed with treatment today without any dose modifications. --Continue with weekly labs plus Kyprolis /Dex treatment  as scheduled  #Anemia: --Likely secondary to MM +/- iron  deficiency --Currently on PO iron  therapy daily --Will recheck iron  panel periodically.   #Upper respiratory symptoms --No improvement with Z-pack --Suspect allergies, advised to take Zyrtec plus Flonase  # Memory loss: --Encouraged to do memory exercises including puzzles, suduko, etc --If symptoms worsen, we can make a referral to neurology for further evaluation.   #History of Sepsis 2/2 UTI and Fournier's gangrene of scrotum: --Hospitalized from 12/22/2022-01/08/2023. Underwent debridement of necrotic tissue. --Given PIP tazobactam and Zyvox  initially --Urine culture reveals Proteus mirabilis which is resistant to ciprofloxacin  and nitrofurantoin --Blood cultures negative --D/C on remainder doxycycline  and Augmentin   which he completed on 01/07/2023.  --Evaluated by urology on 7/2 and ID on 7/3 with continued wound healing and no further d/c or evidence of infection.   #History of DVT --He had placement of IVC filter, remains on Xarelto . --Due to poor mobility, and lack of bleeding complications, recommend to continue on Xarelto  indefinitely. --caution if Plt count were to drop <50  #Snoring/Risk for OSA: --Diagnosed with OSA and has CPAP machine.   # Supportive Care -- provided patient with an albuterol  inhaler (for use with daratumumab ) --acyclovir  400mg  BID for VZV prophylaxis --zofran  8mg  q8H PRN for nausea/vomiting  --Zometa  is being held in the setting of his prior episode of osteonecrosis of the jaw  No orders of the defined types were placed in this encounter.   All questions were answered. The patient knows to call the clinic with any problems, questions or concerns.  I have spent a total of 30 minutes minutes of face-to-face and non-face-to-face time, preparing to see the patient,  performing a medically appropriate examination, counseling and educating the patient, communicating with other health care  professionals, documenting clinical information in the electronic health record,  and care coordination.   Wyline Hearing PA-C Dept of Hematology and Oncology [facility] Phone: [phone]    12/07/2023 11:38 AM   Literature Support:  Bringhen S, Mina R, Petrucci MT, Gaidano G, Ballanti S, Musto P, Offidani M, Spada S, Benevolo G, Ponticelli E, Galieni P, Cavo M, Di Toritto TC, Di Raimondo F, Montefusco V, Palumbo A, Boccadoro M, Larocca A. Once-weekly versus twice-weekly carfilzomib  in patients with newly diagnosed multiple myeloma: a pooled analysis of two phase I/II studies. Haematologica. 2019 Aug;104(8):1640-1647.  --Once-weekly 70 mg/m2 carfilzomib  as induction and maintenance therapy for newly diagnosed multiple myeloma patients was as safe and effective as twice-weekly 36 mg/m2 carfilzomib  and provided a more convenient schedule.

## 2023-12-07 NOTE — Patient Instructions (Signed)
 [facility] - A DEPT OF [facility]  Discharge Instructions: Thank you for choosing [facility] to provide your oncology and hematology care.   If you have a lab appointment with the Cancer Center, please go directly to the Cancer Center and check in at the registration area.   Wear comfortable clothing and clothing appropriate for easy access to any Portacath or PICC line.   We strive to give you quality time with your provider. You may need to reschedule your appointment if you arrive late (15 or more minutes).  Arriving late affects you and other patients whose appointments are after yours.  Also, if you miss three or more appointments without notifying the office, you may be dismissed from the clinic at the provider's discretion.      For prescription refill requests, have your pharmacy contact our office and allow 72 hours for refills to be completed.    Today you received the following chemotherapy and/or immunotherapy agents Kyprolis      To help prevent nausea and vomiting after your treatment, we encourage you to take your nausea medication as directed.  BELOW ARE SYMPTOMS THAT SHOULD BE REPORTED IMMEDIATELY: *FEVER GREATER THAN 100.4 F (38 C) OR HIGHER *CHILLS OR SWEATING *NAUSEA AND VOMITING THAT IS NOT CONTROLLED WITH YOUR NAUSEA MEDICATION *UNUSUAL SHORTNESS OF BREATH *UNUSUAL BRUISING OR BLEEDING *URINARY PROBLEMS (pain or burning when urinating, or frequent urination) *BOWEL PROBLEMS (unusual diarrhea, constipation, pain near the anus) TENDERNESS IN MOUTH AND THROAT WITH OR WITHOUT PRESENCE OF ULCERS (sore throat, sores in mouth, or a toothache) UNUSUAL RASH, SWELLING OR PAIN  UNUSUAL VAGINAL DISCHARGE OR ITCHING   Items with * indicate a potential emergency and should be followed up as soon as possible or go to the Emergency Department if any problems should occur.  Please show the CHEMOTHERAPY ALERT CARD or IMMUNOTHERAPY  ALERT CARD at check-in to the Emergency Department and triage nurse.  Should you have questions after your visit or need to cancel or reschedule your appointment, please contact [facility] - A DEPT OF [facility]  Dept: [phone]  and follow the prompts.  Office hours are 8:00 a.m. to 4:30 p.m. Monday - Friday. Please note that voicemails left after 4:00 p.m. may not be returned until the following business day.  We are closed weekends and major holidays. You have access to a nurse at all times for urgent questions. Please call the main number to the clinic Dept: [phone] and follow the prompts.   For any non-urgent questions, you may also contact your provider using MyChart. We now offer e-Visits for anyone 49 and older to request care online for non-urgent symptoms. For details visit [email or web address].   Also download the MyChart app! Go to the app store, search "MyChart", open the app, select [facility] [place], and log in with your MyChart username and password.

## 2023-12-11 ENCOUNTER — Other Ambulatory Visit: Payer: Self-pay

## 2023-12-16 ENCOUNTER — Other Ambulatory Visit: Payer: Self-pay

## 2023-12-21 ENCOUNTER — Inpatient Hospital Stay: Payer: Medicare (Managed Care)

## 2023-12-21 ENCOUNTER — Inpatient Hospital Stay: Payer: Medicare (Managed Care) | Admitting: Hematology and Oncology

## 2023-12-21 ENCOUNTER — Encounter: Payer: Self-pay | Admitting: Hematology and Oncology

## 2023-12-21 VITALS — BP 147/77 | HR 73 | Temp 97.0°F | Resp 14 | Wt 249.0 lb

## 2023-12-21 DIAGNOSIS — C9002 Multiple myeloma in relapse: Secondary | ICD-10-CM | POA: Diagnosis not present

## 2023-12-21 DIAGNOSIS — Z95828 Presence of other vascular implants and grafts: Secondary | ICD-10-CM | POA: Diagnosis not present

## 2023-12-21 DIAGNOSIS — Z5111 Encounter for antineoplastic chemotherapy: Secondary | ICD-10-CM | POA: Diagnosis not present

## 2023-12-21 DIAGNOSIS — G8220 Paraplegia, unspecified: Secondary | ICD-10-CM | POA: Diagnosis not present

## 2023-12-21 LAB — CBC WITH DIFFERENTIAL (CANCER CENTER ONLY)
Abs Immature Granulocytes: 0.05 10*3/uL (ref 0.00–0.07)
Basophils Absolute: 0 10*3/uL (ref 0.0–0.1)
Basophils Relative: 0 %
Eosinophils Absolute: 0.2 10*3/uL (ref 0.0–0.5)
Eosinophils Relative: 3 %
HCT: 32.9 % — ABNORMAL LOW (ref 39.0–52.0)
Hemoglobin: 11.1 g/dL — ABNORMAL LOW (ref 13.0–17.0)
Immature Granulocytes: 1 %
Lymphocytes Relative: 9 %
Lymphs Abs: 0.8 10*3/uL (ref 0.7–4.0)
MCH: 27.7 pg (ref 26.0–34.0)
MCHC: 33.7 g/dL (ref 30.0–36.0)
MCV: 82 fL (ref 80.0–100.0)
Monocytes Absolute: 0.7 10*3/uL (ref 0.1–1.0)
Monocytes Relative: 9 %
Neutro Abs: 6.5 10*3/uL (ref 1.7–7.7)
Neutrophils Relative %: 78 %
Platelet Count: 373 10*3/uL (ref 150–400)
RBC: 4.01 MIL/uL — ABNORMAL LOW (ref 4.22–5.81)
RDW: 17.9 % — ABNORMAL HIGH (ref 11.5–15.5)
WBC Count: 8.3 10*3/uL (ref 4.0–10.5)
nRBC: 0 % (ref 0.0–0.2)

## 2023-12-21 LAB — CMP (CANCER CENTER ONLY)
ALT: 15 U/L (ref 0–44)
AST: 13 U/L — ABNORMAL LOW (ref 15–41)
Albumin: 3.8 g/dL (ref 3.5–5.0)
Alkaline Phosphatase: 100 U/L (ref 38–126)
Anion gap: 4 — ABNORMAL LOW (ref 5–15)
BUN: 12 mg/dL (ref 8–23)
CO2: 30 mmol/L (ref 22–32)
Calcium: 9.4 mg/dL (ref 8.9–10.3)
Chloride: 111 mmol/L (ref 98–111)
Creatinine: 0.76 mg/dL (ref 0.61–1.24)
GFR, Estimated: >60 mL/min (ref >60)
Glucose, Bld: 123 mg/dL — ABNORMAL HIGH (ref 70–99)
Potassium: 3.7 mmol/L (ref 3.5–5.1)
Sodium: 145 mmol/L (ref 135–145)
Total Bilirubin: 0.5 mg/dL (ref 0.0–1.2)
Total Protein: 6.3 g/dL — ABNORMAL LOW (ref 6.5–8.1)

## 2023-12-21 MED ORDER — SODIUM CHLORIDE 0.9 % IV SOLN: Freq: Once | INTRAVENOUS | Status: AC

## 2023-12-21 MED ORDER — DEXTROSE 5 % IV SOLN
70.0000 mg/m2 | Freq: Once | INTRAVENOUS | Status: AC
  Administered 2023-12-21: 150 mg via INTRAVENOUS
  Filled 2023-12-21: qty 60

## 2023-12-21 MED ORDER — SODIUM CHLORIDE 0.9 % IV SOLN
40.0000 mg | Freq: Once | INTRAVENOUS | Status: AC
  Administered 2023-12-21: 40 mg via INTRAVENOUS
  Filled 2023-12-21: qty 4

## 2023-12-21 NOTE — Progress Notes (Signed)
 [facility] [phone]   [phone]  PROGRESS NOTE  Patient Care Team: Dean Every, MD as PCP - General (Family Medicine) Rawland Caddy, MD as Consulting Physician (Physical Medicine and Rehabilitation) [name], PA-C as Physician Assistant (Physical Medicine and Rehabilitation) [name], MD as Consulting Physician (Hematology and Oncology) [name], MD as Consulting Physician (Hematology and Oncology) [name], MD as Consulting Physician (Hematology and Oncology)  Hematological/Oncological History # IgG Lambda Multiple Myeloma, Relapsed (ISS Stage II) 1) 06/2010: initial diagnosis of Multiple Myeloma after T8 compression fracture. Treated with Velcade /Revlimid /Dexamethasone  and achieved a complete remission 2) Velcade  was discontinued in September 2012 and that Revlimid  and Decadron  were discontinued in March 2013. 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 4) Followed by Dr. Marton Sleeper, last clinic visit 10/09/2019. At that time there was concern for relapse of his multiple myeloma.  5) Patient requested transfer to different provider after misunderstanding regarding imaging studies 6) 12/17/2019: transfer care to Dr. [name]  7) 01/09/2020: Cycle 1 Day 1 of Dara/Velcade /Dex 8) 01/21/2020: presented as urgent visit for diarrhea and dehydration. Holding chemotherapy scheduled for 01/23/2020. 9) 01/30/2020: Resume dara/velcade /dex after resolution of diarrhea.  10) 02/13/2020: restaging labs show M protein 0.8, Kappa 4.5, lamba 17.2, ratio 0.26, urine M protein 53 (7.1%). All MM labs indicate improvement.  11) 03/10/2020: Cycle 4 Day 1 of Dara/Velcade /Dex. Transition to q 3 week daratumumab .  12) 06/04/2020:  Cycle 8 Day 1 of Dara/Velcade /Dex 13) 06/25/2020:  Cycle 9 Day 1 of Dara/Velcade /Dex 14) 07/22/2020: Cycle 10 Day 1  of  Dara//Dex 15) 08/18/2020: Cycle 11 Day 1 of Dara//Dex 16) 09/23/2020: Cycle 12 Day 1 of Dara//Dex 17) 10/22/2020: Cycle 13 Day 1 of Dara/Dex  18) 11/19/2020: Cycle 14 Day 1 of Dara/Dex  19) 12/17/2020: Cycle 15 Day 1 of Dara/Dex  20) 01/17/2021: Cycle 16 Day 1 of Dara/Dex  21) 02/11/2021: Cycle 17 Day 1 of Dara/Dex 22) 03/10/2021: Cycle 18 Day 1 of Dara/Dex  23) 04/08/2021: Cycle 19 Day 1 of Dara/Dex  24) 08/03/2021: Cycle 20 Day 1 of Dara/Dex (delayed due to scheduling error) 25) 09/02/2021: Cycle 21 Day 1 of Dara/Dex 26) 09/30/2021: Cycle 22 Day 1 of Dara/Dex 27) 10/28/2021: Cycle 23 Day 1 of Dara/Dex 28) 12/02/2021: Cycle 24 Day 1 of Dara/Dex 29) 12/30/2021: Cycle 25 Day 1 of Dara/Dex 30) 01/06/2022: Cycle 1 Day 1 of Kyprolis /Dex 31) 02/03/2022: Cycle 2 Day 1 of Kyprolis /Dex 32) 02/24/2022: Cycle 3 Day 1 of Kyprolis /Dex 33) 04/07/2022: Cycle 4 Day 1 of Kyprolis /Dex 34) 05/05/2022: Cycle 5 Day 1 of Kyprolis /Dex 35) 06/01/2022: Cycle 6 Day 1 of Kyprolis /Dex 36) 06/29/2022: Cycle 7 Day 1 of Kyprolis /Dex 37) 07/28/2022: Cycle 8 Day 1 of Kyprolis /Dex 38) 08/25/2022: Cycle 9 Day 1 of Kyprolis /Dex 39) 09/22/2022: Cycle 10 Day 1 of Kyprolis /Dex 40) 10/20/2022: Cycle 11 Day 1 of Kyprolis /Dex 41) 11/17/2022: Cycle 12 Day 1 of Kyprolis /Dex 42 ) 12/22/2022-01/08/2023: Admitted for fournier's gangrene of scrotum.  43) 01/26/2023: Cycle 13 Day 1 of Kyprolis /Dex 44) 02/16/2023: Cycle 14 Day 1 of Kyprolis /Dex 45) 03/16/2023:Cycle 15 Day 1 of Kyprolis /Dex 46) 04/13/2023: Cycle 16 Day 1 of Kyprolis /Dex 47) 05/11/2023: Cycle 17 Day 1 of Kyprolis /Dex 48) 06/07/2023: Cycle 18 Day 1 of Kyprolis /Dex 49) 07/06/2023: Cycle 19 Day 1 of Kyprolis /Dex 50) 08/02/2023: Cycle 20 Day 1 of Kyprolis /Dex 51) 08/31/2023: Cycle 21 Day 1 of  Kyprolis /Dex 52) 09/28/2023: Cycle 22 Day 1 of Kyprolis /Dex 53) 10/26/2023: Cycle 23 Day 1 of Kyprolis /Dex 54) 11/23/2023: Cycle 24 Day 1 of Kyprolis /Dex  Interval History:  Demetrios Finders III 68 y.o. male with  medical history significant for  IgG Lambda Multiple Myeloma who presents for a follow up visit. The patient's last visit was on 12/07/2023.  He presents today prior to treatment with Kyprolis /Dex. He is unaccompanied for this visit.   On exam today Mr. Bezek reports he feels well overall in interim since her last visit.  Overall he has been healthy with good energy and strong appetite.  He does feel like he has been sleeping a lot lately however.  He notes that he has not had any infectious symptoms such as fevers, chills, sweats, nausea, vomiting or diarrhea.  Overall he is tolerating his Kyprolis  therapy well but wishes that the dose could be less frequent.  Otherwise he is at his baseline level of health.  He is willing and able to proceed with treatment at this time.  He denies any neuropathy of the hands.  He remains in good spirits.  A full 10 point ROS is otherwise negative.  MEDICAL HISTORY:  Past Medical History:  Diagnosis Date   Adrenal insufficiency (HCC)    on chronic dexamethasone    Anemia    Cancer (HCC)    Coagulopathy (HCC)    on xeralto/ s/p DVT while on coumadin,  IVC in place   Diabetes mellitus without complication (HCC)    type 2   Glaucoma 12/22/2022   Gross hematuria 01/2013   post foley cath procedure   History of blood transfusion 01/2013   Lower GI bleeding 04/18/2017   Multiple myeloma    thoracic T8 with paraplegia s/p resection- on chemo at visit 10/13/10   Multiple myeloma    Multiple myeloma without mention of remission    Neurogenic bladder    Neurogenic bowel    OSA (obstructive sleep apnea) 11/01/2022   Paraplegia (HCC)    Partial small bowel obstruction (HCC) during dec 2011 admission    SURGICAL HISTORY: Past Surgical History:  Procedure Laterality Date   COLONOSCOPY WITH PROPOFOL  N/A 04/12/2017   Procedure: COLONOSCOPY WITH PROPOFOL ;  Surgeon: Tobin Forts, MD;  Location: [facility];  Service: Endoscopy;  Laterality: N/A;   COLONOSCOPY WITH  PROPOFOL  N/A 04/19/2017   Procedure: COLONOSCOPY WITH PROPOFOL ;  Surgeon: Ace Holder, MD;  Location: [facility];  Service: Gastroenterology;  Laterality: N/A;   COLOSTOMY  07/20/2011   Procedure: COLOSTOMY;  Surgeon: Gorman Laughter, DO;  Location: [facility] OR;  Service: General;;   COLOSTOMY REVISION  07/20/2011   Procedure: COLON RESECTION SIGMOID;  Surgeon: Gorman Laughter, DO;  Location: [facility] OR;  Service: General;;   CYSTOSCOPY N/A 04/04/2013   Procedure: CYSTOSCOPY WITH LITHALOPAXY;  Surgeon: Osborn Blaze, MD;  Location: [facility];  Service: Urology;  Laterality: N/A;   INCISION AND DRAINAGE ABSCESS N/A 12/25/2022   Procedure: INCISION AND DRAINAGE OF SCROTUM;  Surgeon: Mallie Seal, MD;  Location: [facility];  Service: Urology;  Laterality: N/A;   INSERTION OF SUPRAPUBIC CATHETER N/A 04/04/2013   Procedure: INSERTION OF SUPRAPUBIC CATHETER;  Surgeon: Osborn Blaze, MD;  Location: [facility];  Service: Urology;  Laterality: N/A;   LAPAROTOMY  07/20/2011   Procedure: EXPLORATORY LAPAROTOMY;  Surgeon: Gorman Laughter, DO;  Location: [facility] OR;  Service: General;  Laterality: N/A;   myeloma thoracic T8 with parpaplegia s/p thoracotomy and thoracic T7-9  cage placement on Dec 26th 2011  07/18/10   SCROTAL EXPLORATION N/A 12/29/2022   Procedure: SCROTAL WOUND DEBRIDEMENT AND CLOSURE;  Surgeon: Melody Spurling., MD;  Location: [facility];  Service: Urology;  Laterality: N/A;    SOCIAL HISTORY: Social History   Socioeconomic History   Marital status: Married    Spouse name: Not on file   Number of children: Not on file   Years of education: Not on file   Highest education level: Not on file  Occupational History   Not on file  Tobacco Use   Smoking status: Never   Smokeless tobacco: Never  Vaping Use   Vaping status: Never Used  Substance and Sexual Activity   Alcohol use: No   Drug use: No   Sexual activity: Never  Other Topics Concern   Not on file  Social History Narrative    Not on file   Social Drivers of Health   Financial Resource Strain: Not on file  Food Insecurity: No Food Insecurity (12/22/2022)   Hunger Vital Sign    Worried About Running Out of Food in the Last Year: Never true    Ran Out of Food in the Last Year: Never true  Transportation Needs: No Transportation Needs (12/22/2022)   PRAPARE - Administrator, Civil Service (Medical): No    Lack of Transportation (Non-Medical): No  Physical Activity: Not on file  Stress: Not on file  Social Connections: Not on file  Intimate Partner Violence: Not At Risk (12/22/2022)   Humiliation, Afraid, Rape, and Kick questionnaire    Fear of Current or Ex-Partner: No    Emotionally Abused: No    Physically Abused: No    Sexually Abused: No    FAMILY HISTORY: Family History  Problem Relation Age of Onset   Ovarian cancer Mother    Diabetes Father     ALLERGIES:  is allergic to ferumoxytol .  MEDICATIONS:  Current Outpatient Medications  Medication Sig Dispense Refill   acetaminophen  (TYLENOL ) 325 MG tablet Take 325-650 mg by mouth every 6 (six) hours as needed for headache or mild pain.     acetic acid 0.25 % irrigation Irrigate with 1 Application as directed See admin instructions. Instill 50 ml's, clamp for 10 minutes, then remove clamp and drain the bladder. Repeat 3 times a week- when not using the Renacidin irrigation     acyclovir  (ZOVIRAX ) 400 MG tablet Take 1 tablet (400 mg total) by mouth 2 (two) times daily. 60 tablet 2   albuterol  (VENTOLIN  HFA) 108 (90 Base) MCG/ACT inhaler Inhale 2 puffs into the lungs every 4 (four) hours as needed for wheezing or shortness of breath.     atorvastatin  (LIPITOR) 20 MG tablet Take 20 mg by mouth daily.     BENADRYL  ALLERGY 25 MG tablet Take 25 mg by mouth every 6 (six) hours as needed (for allergic reactions).     BIOFREEZE 4 % GEL Apply 1 application  topically every 4 (four) hours as needed (for joint pain- to intact areas of the skin only).      Citric Ac-Gluconolact-Mg Carb (RENACIDIN IR) Irrigate with 30 mLs as directed See admin instructions. Instill 30 ml's, clamp for 10 minutes, then remove clamp and drain the bladder. Repeat 3 times a week- when not using the acetic acid irrigation     COSOPT  PF 2-0.5 % SOLN Place 1 drop into both eyes 2 (two) times daily.     diazepam  (VALIUM ) 5 MG tablet  Take 5 mg by mouth 2 (two) times daily as needed for muscle spasms (after spinal cord injury).     Emollient (EUCERIN EX) Apply 1 application  topically See admin instructions. Apply to affected area 2 times a day     FLOVENT HFA 110 MCG/ACT inhaler 2 each See admin instructions. 2 sprays to colostomy stoma prior to each colostomy bag change     guaifenesin  (ROBITUSSIN) 100 MG/5ML syrup Take 200 mg by mouth 4 (four) times daily as needed for cough.     iron  polysaccharides (NU-IRON ) 150 MG capsule Take 1 capsule (150 mg total) by mouth daily. 30 capsule 0   latanoprost  (XALATAN ) 0.005 % ophthalmic solution Place 1 drop into both eyes at bedtime.     Multiple Vitamin (MULTIVITAMIN WITH MINERALS) TABS tablet Take 1 tablet by mouth daily. 30 tablet 0   NON FORMULARY Apply 1 application  topically See admin instructions. Colo Plast paste- Apply as directed with each ostomy bag change     ondansetron  (ZOFRAN ) 4 MG tablet Take 1 tablet (4 mg) by mouth every 6 hours as needed for nausea. 20 tablet 0   rivaroxaban  (XARELTO ) 10 MG TABS tablet Take 1 tablet (10 mg total) by mouth daily with supper. (Patient taking differently: Take 10 mg by mouth daily.) 30 tablet 9   Skin Protectants, Misc. (MINERIN CREME) CREA Apply to affected areas 2 times daily as needed. 454 g 0   Zinc  Oxide (BALMEX EX) Apply 1 application  topically See admin instructions. Apply topically daily to affected sites     No current facility-administered medications for this visit.   Facility-Administered Medications Ordered in Other Visits  Medication Dose Route Frequency Provider Last  Rate Last Admin   sodium chloride  flush (NS) 0.9 % injection 10 mL  10 mL Intravenous PRN Marton Sleeper, Ni, MD        REVIEW OF SYSTEMS:   Constitutional: ( - ) fevers, ( - )  chills , ( - ) night sweats Eyes: ( - ) blurriness of vision, ( - ) double vision, ( - ) watery eyes Ears, nose, mouth, throat, and face: ( - ) mucositis, ( - ) sore throat Respiratory: ( - ) cough, ( - ) dyspnea, ( - ) wheezes Cardiovascular: ( - ) palpitation, ( - ) chest discomfort, ( - ) lower extremity swelling Gastrointestinal:  ( - ) nausea, ( - ) heartburn, ( - ) change in bowel habits Skin: ( - ) abnormal skin rashes Lymphatics: ( - ) new lymphadenopathy, ( - ) easy bruising Neurological: ( - ) numbness, ( - ) tingling, ( - ) new weaknesses Behavioral/Psych: ( - ) mood change, ( - ) new changes  All other systems were reviewed with the patient and are negative.  PHYSICAL EXAMINATION: ECOG PERFORMANCE STATUS: paraplegic.   Vitals:   12/21/23 1058  BP: (!) 147/77  Pulse: 73  Resp: 14  Temp: (!) 97 F (36.1 C)  SpO2: 100%    Filed Weights   12/21/23 1058  Weight: 249 lb (112.9 kg)      GENERAL: well appearing middle aged [place] American male alert, no distress and comfortable SKIN: skin color, texture, turgor are normal, no rashes or significant lesions EYES: conjunctiva are pink and non-injected, sclera clear LUNGS: clear to auscultation and percussion with normal breathing effort HEART: regular rate & rhythm and no murmurs Musculoskeletal: no cyanosis of digits and no clubbing  PSYCH: alert & oriented x 3, fluent speech NEURO: paraplegic,  no use of LE bilaterally.   LABORATORY DATA:  I have reviewed the data as listed    Latest Ref Rng & Units 12/21/2023   10:13 AM 12/07/2023    9:40 AM 11/30/2023    9:39 AM  CBC  WBC 4.0 - 10.5 K/uL 8.3  9.0  7.2   Hemoglobin 13.0 - 17.0 g/dL 43.3  29.5  18.8   Hematocrit 39.0 - 52.0 % 32.9  32.4  32.9   Platelets 150 - 400 K/uL 373  170  174         Latest Ref Rng & Units 12/21/2023   10:13 AM 12/07/2023    9:40 AM 11/30/2023    9:39 AM  CMP  Glucose 70 - 99 mg/dL 416  606  90   BUN 8 - 23 mg/dL 12  16  14    Creatinine 0.61 - 1.24 mg/dL 3.01  6.01  0.93   Sodium 135 - 145 mmol/L 145  146  145   Potassium 3.5 - 5.1 mmol/L 3.7  3.7  3.6   Chloride 98 - 111 mmol/L 111  113  113   CO2 22 - 32 mmol/L 30  24  28    Calcium  8.9 - 10.3 mg/dL 9.4  9.3  9.2   Total Protein 6.5 - 8.1 g/dL 6.3  6.1  6.1   Total Bilirubin 0.0 - 1.2 mg/dL 0.5  0.6  0.8   Alkaline Phos 38 - 126 U/L 100  91  95   AST 15 - 41 U/L 13  10  10    ALT 0 - 44 U/L 15  15  13      Lab Results  Component Value Date   MPROTEIN Not Observed 11/23/2023   MPROTEIN 0.1 (H) 10/26/2023   MPROTEIN 0.1 (H) 10/05/2023   Lab Results  Component Value Date   KPAFRELGTCHN 11.7 11/23/2023   KPAFRELGTCHN 19.8 (H) 10/26/2023   KPAFRELGTCHN 4.8 10/05/2023   LAMBDASER 6.5 11/23/2023   LAMBDASER 12.5 10/26/2023   LAMBDASER 3.4 (L) 10/05/2023   KAPLAMBRATIO 1.80 (H) 11/23/2023   KAPLAMBRATIO 1.58 10/26/2023   KAPLAMBRATIO 1.41 10/05/2023    RADIOGRAPHIC STUDIES: No results found.    ASSESSMENT & PLAN LLEWELLYN CHOPLIN is a 68 y.o.  male with medical history significant for  IgG Lambda Multiple Myeloma who presents for a follow up visit.  # IgG Lambda Multiple Myeloma, Relapsed (ISS Stage II) --findings are most consistent with relapsed multiple myeloma. Patient previously successfully treated with Velcade /Rev/Dex and Daratumumab /Velcade /Dex. On 07/02/2020 he transitioned to monthly daratumumab  alone.  --due to to rise in M protein, switched to Kyprolis  and Dexamethasone  on 01/06/2022 Plan: --Due for Cycle 25, Day 1 of Kyprolis /Dex today  --Labs show white blood cell count 8.3, hemoglobin 11.1, MCV 82, platelets 373. Creatinine and LFTs are in range.  --Most recent myeloma labs from 11/23/2023 showed M protein not detectable with kappa improved from 19.8 to 11.7, lambda light chain  6.5, ratio 1.80. --Proceed with treatment today without any dose modifications. --Continue with weekly labs plus Kyprolis /Dex treatment as scheduled  #Anemia: --Likely secondary to MM +/- iron  deficiency --Currently on PO iron  therapy daily --Will recheck iron  panel periodically.   #Upper respiratory symptoms --No improvement with Z-pack --Suspect allergies, advised to take Zyrtec plus Flonase  # Memory loss: --Encouraged to do memory exercises including puzzles, suduko, etc --Likely secondary to chemotherapy  --If symptoms worsen, we can make a referral to neurology for further evaluation.   #History of Sepsis  2/2 UTI and Fournier's gangrene of scrotum: --Hospitalized from 12/22/2022-01/08/2023. Underwent debridement of necrotic tissue. --Given PIP tazobactam and Zyvox  initially --Urine culture reveals Proteus mirabilis which is resistant to ciprofloxacin  and nitrofurantoin --Blood cultures negative --D/C on remainder doxycycline  and Augmentin  which he completed on 01/07/2023.  --Evaluated by urology on 7/2 and ID on 7/3 with continued wound healing and no further d/c or evidence of infection.   #History of DVT --He had placement of IVC filter, remains on Xarelto . --Due to poor mobility, and lack of bleeding complications, recommend to continue on Xarelto  indefinitely. --caution if Plt count were to drop <50  #Snoring/Risk for OSA: --Diagnosed with OSA and has CPAP machine.   # Supportive Care -- provided patient with an albuterol  inhaler (for use with daratumumab ) --acyclovir  400mg  BID for VZV prophylaxis --zofran  8mg  q8H PRN for nausea/vomiting  --Zometa  is being held in the setting of his prior episode of osteonecrosis of the jaw  No orders of the defined types were placed in this encounter.   All questions were answered. The patient knows to call the clinic with any problems, questions or concerns.  I have spent a total of 30 minutes minutes of face-to-face and  non-face-to-face time, preparing to see the patient,  performing a medically appropriate examination, counseling and educating the patient, communicating with other health care professionals, documenting clinical information in the electronic health record,  and care coordination.   Rogerio Clay, MD Department of Hematology/Oncology [facility] Phone: [phone] Pager: [phone] Email: [email or web address]  12/21/2023 4:04 PM   Literature Support:  Bringhen S, Mina R, Petrucci MT, Gaidano G, Ballanti S, Musto P, Offidani M, Spada S, Benevolo G, Ponticelli E, Galieni P, Cavo M, Di Toritto TC, Di Raimondo F, Montefusco V, Palumbo A, Boccadoro M, Larocca A. Once-weekly versus twice-weekly carfilzomib  in patients with newly diagnosed multiple myeloma: a pooled analysis of two phase I/II studies. Haematologica. 2019 Aug;104(8):1640-1647.  --Once-weekly 70 mg/m2 carfilzomib  as induction and maintenance therapy for newly diagnosed multiple myeloma patients was as safe and effective as twice-weekly 36 mg/m2 carfilzomib  and provided a more convenient schedule.

## 2023-12-21 NOTE — Patient Instructions (Signed)
 [facility] - A DEPT OF [facility]  Discharge Instructions: Thank you for choosing [facility] to provide your oncology and hematology care.   If you have a lab appointment with the Cancer Center, please go directly to the Cancer Center and check in at the registration area.   Wear comfortable clothing and clothing appropriate for easy access to any Portacath or PICC line.   We strive to give you quality time with your provider. You may need to reschedule your appointment if you arrive late (15 or more minutes).  Arriving late affects you and other patients whose appointments are after yours.  Also, if you miss three or more appointments without notifying the office, you may be dismissed from the clinic at the provider's discretion.      For prescription refill requests, have your pharmacy contact our office and allow 72 hours for refills to be completed.    Today you received the following chemotherapy and/or immunotherapy agents: carfilzomib      To help prevent nausea and vomiting after your treatment, we encourage you to take your nausea medication as directed.  BELOW ARE SYMPTOMS THAT SHOULD BE REPORTED IMMEDIATELY: *FEVER GREATER THAN 100.4 F (38 C) OR HIGHER *CHILLS OR SWEATING *NAUSEA AND VOMITING THAT IS NOT CONTROLLED WITH YOUR NAUSEA MEDICATION *UNUSUAL SHORTNESS OF BREATH *UNUSUAL BRUISING OR BLEEDING *URINARY PROBLEMS (pain or burning when urinating, or frequent urination) *BOWEL PROBLEMS (unusual diarrhea, constipation, pain near the anus) TENDERNESS IN MOUTH AND THROAT WITH OR WITHOUT PRESENCE OF ULCERS (sore throat, sores in mouth, or a toothache) UNUSUAL RASH, SWELLING OR PAIN  UNUSUAL VAGINAL DISCHARGE OR ITCHING   Items with * indicate a potential emergency and should be followed up as soon as possible or go to the Emergency Department if any problems should occur.  Please show the CHEMOTHERAPY ALERT CARD or  IMMUNOTHERAPY ALERT CARD at check-in to the Emergency Department and triage nurse.  Should you have questions after your visit or need to cancel or reschedule your appointment, please contact [facility] - A DEPT OF [facility]  Dept: [phone]  and follow the prompts.  Office hours are 8:00 a.m. to 4:30 p.m. Monday - Friday. Please note that voicemails left after 4:00 p.m. may not be returned until the following business day.  We are closed weekends and major holidays. You have access to a nurse at all times for urgent questions. Please call the main number to the clinic Dept: [phone] and follow the prompts.   For any non-urgent questions, you may also contact your provider using MyChart. We now offer e-Visits for anyone 73 and older to request care online for non-urgent symptoms. For details visit [email or web address].   Also download the MyChart app! Go to the app store, search "MyChart", open the app, select [place], and log in with your MyChart username and password.

## 2023-12-24 LAB — KAPPA/LAMBDA LIGHT CHAINS
Kappa free light chain: 7.4 mg/L (ref 3.3–19.4)
Kappa, lambda light chain ratio: 2.06 — ABNORMAL HIGH (ref 0.26–1.65)
Lambda free light chains: 3.6 mg/L — ABNORMAL LOW (ref 5.7–26.3)

## 2023-12-25 LAB — MULTIPLE MYELOMA PANEL, SERUM
Albumin SerPl Elph-Mcnc: 3.1 g/dL (ref 2.9–4.4)
Albumin/Glob SerPl: 1.3 (ref 0.7–1.7)
Alpha 1: 0.3 g/dL (ref 0.0–0.4)
Alpha2 Glob SerPl Elph-Mcnc: 0.8 g/dL (ref 0.4–1.0)
B-Globulin SerPl Elph-Mcnc: 0.9 g/dL (ref 0.7–1.3)
Gamma Glob SerPl Elph-Mcnc: 0.5 g/dL (ref 0.4–1.8)
Globulin, Total: 2.5 g/dL (ref 2.2–3.9)
IgA: 43 mg/dL — ABNORMAL LOW (ref 61–437)
IgG (Immunoglobin G), Serum: 502 mg/dL — ABNORMAL LOW (ref 603–1613)
IgM (Immunoglobulin M), Srm: 17 mg/dL — ABNORMAL LOW (ref 20–172)
Total Protein ELP: 5.6 g/dL — ABNORMAL LOW (ref 6.0–8.5)

## 2023-12-28 ENCOUNTER — Inpatient Hospital Stay: Payer: Medicare (Managed Care) | Attending: Physician Assistant

## 2023-12-28 ENCOUNTER — Inpatient Hospital Stay: Payer: Medicare (Managed Care)

## 2023-12-28 VITALS — BP 141/94 | HR 84 | Temp 98.1°F | Resp 16

## 2023-12-28 DIAGNOSIS — G4733 Obstructive sleep apnea (adult) (pediatric): Secondary | ICD-10-CM | POA: Insufficient documentation

## 2023-12-28 DIAGNOSIS — Z86718 Personal history of other venous thrombosis and embolism: Secondary | ICD-10-CM | POA: Diagnosis not present

## 2023-12-28 DIAGNOSIS — C9002 Multiple myeloma in relapse: Secondary | ICD-10-CM | POA: Diagnosis not present

## 2023-12-28 DIAGNOSIS — Z5111 Encounter for antineoplastic chemotherapy: Secondary | ICD-10-CM | POA: Insufficient documentation

## 2023-12-28 DIAGNOSIS — Z8041 Family history of malignant neoplasm of ovary: Secondary | ICD-10-CM | POA: Diagnosis not present

## 2023-12-28 DIAGNOSIS — Z87440 Personal history of urinary (tract) infections: Secondary | ICD-10-CM | POA: Insufficient documentation

## 2023-12-28 DIAGNOSIS — D649 Anemia, unspecified: Secondary | ICD-10-CM | POA: Insufficient documentation

## 2023-12-28 LAB — CMP (CANCER CENTER ONLY)
ALT: 13 U/L (ref 0–44)
AST: 11 U/L — ABNORMAL LOW (ref 15–41)
Albumin: 3.8 g/dL (ref 3.5–5.0)
Alkaline Phosphatase: 96 U/L (ref 38–126)
Anion gap: 4 — ABNORMAL LOW (ref 5–15)
BUN: 11 mg/dL (ref 8–23)
CO2: 29 mmol/L (ref 22–32)
Calcium: 8.9 mg/dL (ref 8.9–10.3)
Chloride: 110 mmol/L (ref 98–111)
Creatinine: 0.7 mg/dL (ref 0.61–1.24)
GFR, Estimated: >60 mL/min (ref >60)
Glucose, Bld: 97 mg/dL (ref 70–99)
Potassium: 3.4 mmol/L — ABNORMAL LOW (ref 3.5–5.1)
Sodium: 143 mmol/L (ref 135–145)
Total Bilirubin: 0.8 mg/dL (ref 0.0–1.2)
Total Protein: 6.1 g/dL — ABNORMAL LOW (ref 6.5–8.1)

## 2023-12-28 LAB — CBC WITH DIFFERENTIAL (CANCER CENTER ONLY)
Abs Immature Granulocytes: 0.04 10*3/uL (ref 0.00–0.07)
Basophils Absolute: 0 10*3/uL (ref 0.0–0.1)
Basophils Relative: 0 %
Eosinophils Absolute: 0.3 10*3/uL (ref 0.0–0.5)
Eosinophils Relative: 2 %
HCT: 34.2 % — ABNORMAL LOW (ref 39.0–52.0)
Hemoglobin: 11.5 g/dL — ABNORMAL LOW (ref 13.0–17.0)
Immature Granulocytes: 0 %
Lymphocytes Relative: 7 %
Lymphs Abs: 0.8 10*3/uL (ref 0.7–4.0)
MCH: 27.4 pg (ref 26.0–34.0)
MCHC: 33.6 g/dL (ref 30.0–36.0)
MCV: 81.4 fL (ref 80.0–100.0)
Monocytes Absolute: 0.8 10*3/uL (ref 0.1–1.0)
Monocytes Relative: 7 %
Neutro Abs: 9.6 10*3/uL — ABNORMAL HIGH (ref 1.7–7.7)
Neutrophils Relative %: 84 %
Platelet Count: 182 10*3/uL (ref 150–400)
RBC: 4.2 MIL/uL — ABNORMAL LOW (ref 4.22–5.81)
RDW: 18.3 % — ABNORMAL HIGH (ref 11.5–15.5)
WBC Count: 11.5 10*3/uL — ABNORMAL HIGH (ref 4.0–10.5)
nRBC: 0 % (ref 0.0–0.2)

## 2023-12-28 MED ORDER — SODIUM CHLORIDE 0.9 % IV SOLN: Freq: Once | INTRAVENOUS | Status: AC

## 2023-12-28 MED ORDER — DEXTROSE 5 % IV SOLN
70.0000 mg/m2 | Freq: Once | INTRAVENOUS | Status: AC
  Administered 2023-12-28: 150 mg via INTRAVENOUS
  Filled 2023-12-28: qty 60

## 2023-12-28 MED ORDER — SODIUM CHLORIDE 0.9 % IV SOLN
40.0000 mg | Freq: Once | INTRAVENOUS | Status: AC
  Administered 2023-12-28: 40 mg via INTRAVENOUS
  Filled 2023-12-28: qty 4

## 2023-12-28 NOTE — Patient Instructions (Signed)
 [facility] - A DEPT OF [facility]  Discharge Instructions: Thank you for choosing [facility] to provide your oncology and hematology care.   If you have a lab appointment with the Cancer Center, please go directly to the Cancer Center and check in at the registration area.   Wear comfortable clothing and clothing appropriate for easy access to any Portacath or PICC line.   We strive to give you quality time with your provider. You may need to reschedule your appointment if you arrive late (15 or more minutes).  Arriving late affects you and other patients whose appointments are after yours.  Also, if you miss three or more appointments without notifying the office, you may be dismissed from the clinic at the provider's discretion.      For prescription refill requests, have your pharmacy contact our office and allow 72 hours for refills to be completed.    Today you received the following chemotherapy and/or immunotherapy agents: carfilzomib      To help prevent nausea and vomiting after your treatment, we encourage you to take your nausea medication as directed.  BELOW ARE SYMPTOMS THAT SHOULD BE REPORTED IMMEDIATELY: *FEVER GREATER THAN 100.4 F (38 C) OR HIGHER *CHILLS OR SWEATING *NAUSEA AND VOMITING THAT IS NOT CONTROLLED WITH YOUR NAUSEA MEDICATION *UNUSUAL SHORTNESS OF BREATH *UNUSUAL BRUISING OR BLEEDING *URINARY PROBLEMS (pain or burning when urinating, or frequent urination) *BOWEL PROBLEMS (unusual diarrhea, constipation, pain near the anus) TENDERNESS IN MOUTH AND THROAT WITH OR WITHOUT PRESENCE OF ULCERS (sore throat, sores in mouth, or a toothache) UNUSUAL RASH, SWELLING OR PAIN  UNUSUAL VAGINAL DISCHARGE OR ITCHING   Items with * indicate a potential emergency and should be followed up as soon as possible or go to the Emergency Department if any problems should occur.  Please show the CHEMOTHERAPY ALERT CARD or  IMMUNOTHERAPY ALERT CARD at check-in to the Emergency Department and triage nurse.  Should you have questions after your visit or need to cancel or reschedule your appointment, please contact [facility] - A DEPT OF [facility]  Dept: [phone]  and follow the prompts.  Office hours are 8:00 a.m. to 4:30 p.m. Monday - Friday. Please note that voicemails left after 4:00 p.m. may not be returned until the following business day.  We are closed weekends and major holidays. You have access to a nurse at all times for urgent questions. Please call the main number to the clinic Dept: [phone] and follow the prompts.   For any non-urgent questions, you may also contact your provider using MyChart. We now offer e-Visits for anyone 73 and older to request care online for non-urgent symptoms. For details visit [email or web address].   Also download the MyChart app! Go to the app store, search "MyChart", open the app, select [place], and log in with your MyChart username and password.

## 2024-01-04 ENCOUNTER — Inpatient Hospital Stay (HOSPITAL_BASED_OUTPATIENT_CLINIC_OR_DEPARTMENT_OTHER): Payer: Medicare (Managed Care) | Admitting: Hematology and Oncology

## 2024-01-04 ENCOUNTER — Inpatient Hospital Stay: Payer: Medicare (Managed Care)

## 2024-01-04 VITALS — BP 142/71 | HR 74 | Temp 97.2°F | Resp 14 | Wt 247.0 lb

## 2024-01-04 DIAGNOSIS — Z95828 Presence of other vascular implants and grafts: Secondary | ICD-10-CM | POA: Diagnosis not present

## 2024-01-04 DIAGNOSIS — Z5111 Encounter for antineoplastic chemotherapy: Secondary | ICD-10-CM

## 2024-01-04 DIAGNOSIS — C9002 Multiple myeloma in relapse: Secondary | ICD-10-CM

## 2024-01-04 DIAGNOSIS — G8220 Paraplegia, unspecified: Secondary | ICD-10-CM

## 2024-01-04 DIAGNOSIS — Z7901 Long term (current) use of anticoagulants: Secondary | ICD-10-CM

## 2024-01-04 LAB — CMP (CANCER CENTER ONLY)
ALT: 15 U/L (ref 0–44)
AST: 13 U/L — ABNORMAL LOW (ref 15–41)
Albumin: 3.8 g/dL (ref 3.5–5.0)
Alkaline Phosphatase: 91 U/L (ref 38–126)
Anion gap: 6 (ref 5–15)
BUN: 11 mg/dL (ref 8–23)
CO2: 29 mmol/L (ref 22–32)
Calcium: 9.4 mg/dL (ref 8.9–10.3)
Chloride: 110 mmol/L (ref 98–111)
Creatinine: 0.71 mg/dL (ref 0.61–1.24)
GFR, Estimated: >60 mL/min (ref >60)
Glucose, Bld: 102 mg/dL — ABNORMAL HIGH (ref 70–99)
Potassium: 3.6 mmol/L (ref 3.5–5.1)
Sodium: 145 mmol/L (ref 135–145)
Total Bilirubin: 0.7 mg/dL (ref 0.0–1.2)
Total Protein: 6.3 g/dL — ABNORMAL LOW (ref 6.5–8.1)

## 2024-01-04 LAB — CBC WITH DIFFERENTIAL (CANCER CENTER ONLY)
Abs Immature Granulocytes: 0.04 10*3/uL (ref 0.00–0.07)
Basophils Absolute: 0 10*3/uL (ref 0.0–0.1)
Basophils Relative: 0 %
Eosinophils Absolute: 0.4 10*3/uL (ref 0.0–0.5)
Eosinophils Relative: 4 %
HCT: 34.8 % — ABNORMAL LOW (ref 39.0–52.0)
Hemoglobin: 11.7 g/dL — ABNORMAL LOW (ref 13.0–17.0)
Immature Granulocytes: 0 %
Lymphocytes Relative: 10 %
Lymphs Abs: 1 10*3/uL (ref 0.7–4.0)
MCH: 27.6 pg (ref 26.0–34.0)
MCHC: 33.6 g/dL (ref 30.0–36.0)
MCV: 82.1 fL (ref 80.0–100.0)
Monocytes Absolute: 0.9 10*3/uL (ref 0.1–1.0)
Monocytes Relative: 9 %
Neutro Abs: 7.7 10*3/uL (ref 1.7–7.7)
Neutrophils Relative %: 77 %
Platelet Count: 184 10*3/uL (ref 150–400)
RBC: 4.24 MIL/uL (ref 4.22–5.81)
RDW: 18.5 % — ABNORMAL HIGH (ref 11.5–15.5)
WBC Count: 10 10*3/uL (ref 4.0–10.5)
nRBC: 0 % (ref 0.0–0.2)

## 2024-01-04 MED ORDER — DEXTROSE 5 % IV SOLN
70.0000 mg/m2 | Freq: Once | INTRAVENOUS | Status: AC
  Administered 2024-01-04: 150 mg via INTRAVENOUS
  Filled 2024-01-04: qty 60

## 2024-01-04 MED ORDER — SODIUM CHLORIDE 0.9 % IV SOLN
40.0000 mg | Freq: Once | INTRAVENOUS | Status: AC
  Administered 2024-01-04: 40 mg via INTRAVENOUS
  Filled 2024-01-04: qty 4

## 2024-01-04 MED ORDER — SODIUM CHLORIDE 0.9 % IV SOLN: Freq: Once | INTRAVENOUS | Status: AC

## 2024-01-04 NOTE — Progress Notes (Unsigned)
 [name] [phone]   [phone]  PROGRESS NOTE  Patient Care Team: Sarrah Cure, DO as PCP - General (Geriatric Medicine) Rawland Caddy, MD as Consulting Physician (Physical Medicine and Rehabilitation) [name], PA-C as Physician Assistant (Physical Medicine and Rehabilitation) [name], MD as Consulting Physician (Hematology and Oncology) [name], MD as Consulting Physician (Hematology and Oncology) [name], MD as Consulting Physician (Hematology and Oncology)  Hematological/Oncological History # IgG Lambda Multiple Myeloma, Relapsed (ISS Stage II) 1) 06/2010: initial diagnosis of Multiple Myeloma after T8 compression fracture. Treated with Velcade /Revlimid /Dexamethasone  and achieved a complete remission 2) Velcade  was discontinued in September 2012 and that Revlimid  and Decadron  were discontinued in March 2013. 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 4) Followed by Dr. Marton Sleeper, last clinic visit 10/09/2019. At that time there was concern for relapse of his multiple myeloma.  5) Patient requested transfer to different provider after misunderstanding regarding imaging studies 6) 12/17/2019: transfer care to Dr. [name]  7) 01/09/2020: Cycle 1 Day 1 of Dara/Velcade /Dex 8) 01/21/2020: presented as urgent visit for diarrhea and dehydration. Holding chemotherapy scheduled for 01/23/2020. 9) 01/30/2020: Resume dara/velcade /dex after resolution of diarrhea.  10) 02/13/2020: restaging labs show M protein 0.8, Kappa 4.5, lamba 17.2, ratio 0.26, urine M protein 53 (7.1%). All MM labs indicate improvement.  11) 03/10/2020: Cycle 4 Day 1 of Dara/Velcade /Dex. Transition to q 3 week daratumumab .  12) 06/04/2020:  Cycle 8 Day 1 of Dara/Velcade /Dex 13) 06/25/2020:  Cycle 9 Day 1 of Dara/Velcade /Dex 14) 07/22/2020: Cycle 10 Day  1 of  Dara//Dex 15) 08/18/2020: Cycle 11 Day 1 of Dara//Dex 16) 09/23/2020: Cycle 12 Day 1 of Dara//Dex 17) 10/22/2020: Cycle 13 Day 1 of Dara/Dex  18) 11/19/2020: Cycle 14 Day 1 of Dara/Dex  19) 12/17/2020: Cycle 15 Day 1 of Dara/Dex  20) 01/17/2021: Cycle 16 Day 1 of Dara/Dex  21) 02/11/2021: Cycle 17 Day 1 of Dara/Dex 22) 03/10/2021: Cycle 18 Day 1 of Dara/Dex  23) 04/08/2021: Cycle 19 Day 1 of Dara/Dex  24) 08/03/2021: Cycle 20 Day 1 of Dara/Dex (delayed due to scheduling error) 25) 09/02/2021: Cycle 21 Day 1 of Dara/Dex 26) 09/30/2021: Cycle 22 Day 1 of Dara/Dex 27) 10/28/2021: Cycle 23 Day 1 of Dara/Dex 28) 12/02/2021: Cycle 24 Day 1 of Dara/Dex 29) 12/30/2021: Cycle 25 Day 1 of Dara/Dex 30) 01/06/2022: Cycle 1 Day 1 of Kyprolis /Dex 31) 02/03/2022: Cycle 2 Day 1 of Kyprolis /Dex 32) 02/24/2022: Cycle 3 Day 1 of Kyprolis /Dex 33) 04/07/2022: Cycle 4 Day 1 of Kyprolis /Dex 34) 05/05/2022: Cycle 5 Day 1 of Kyprolis /Dex 35) 06/01/2022: Cycle 6 Day 1 of Kyprolis /Dex 36) 06/29/2022: Cycle 7 Day 1 of Kyprolis /Dex 37) 07/28/2022: Cycle 8 Day 1 of Kyprolis /Dex 38) 08/25/2022: Cycle 9 Day 1 of Kyprolis /Dex 39) 09/22/2022: Cycle 10 Day 1 of Kyprolis /Dex 40) 10/20/2022: Cycle 11 Day 1 of Kyprolis /Dex 41) 11/17/2022: Cycle 12 Day 1 of Kyprolis /Dex 42 ) 12/22/2022-01/08/2023: Admitted for fournier's gangrene of scrotum.  43) 01/26/2023: Cycle 13 Day 1 of Kyprolis /Dex 44) 02/16/2023: Cycle 14 Day 1 of Kyprolis /Dex 45) 03/16/2023:Cycle 15 Day 1 of Kyprolis /Dex 46) 04/13/2023: Cycle 16 Day 1 of Kyprolis /Dex 47) 05/11/2023: Cycle 17 Day 1 of Kyprolis /Dex 48) 06/07/2023: Cycle 18 Day 1 of Kyprolis /Dex 49) 07/06/2023: Cycle 19 Day 1 of Kyprolis /Dex 50) 08/02/2023: Cycle 20 Day 1 of Kyprolis /Dex 51) 08/31/2023: Cycle 21 Day 1 of  Kyprolis /Dex 52) 09/28/2023: Cycle 22 Day 1 of Kyprolis /Dex 53) 10/26/2023: Cycle 23 Day 1 of Kyprolis /Dex 54) 11/23/2023: Cycle 24 Day 1 of Kyprolis /Dex  Interval History:  [name] 68 y.o. male with  medical history significant for  IgG Lambda Multiple Myeloma who presents for a follow up visit. The patient's last visit was on 12/21/2023.  He presents today prior to treatment with Kyprolis /Dex. He is unaccompanied for this visit.   On exam today [name] reports he is growing a beard but he does not want to.  He reports that this week and he will be go shopping for new razor and he will try to get the beard under control.  He reports he did recently have an episode of bronchitis where he was coughing up some mucus but did not have any fever and tested negative for COVID.  He has been using cough syrup and inhaler and controlling the cough well.  He reports that the phlegm was mostly yellow.  He notes that he is had no nausea, vomiting, or diarrhea.  Overall he feels quite well.  He notes that he has not had any infectious symptoms such as fevers, chills, sweats, nausea, vomiting or diarrhea.  Overall he is tolerating his Kyprolis  therapy well.  He is willing and able to proceed with treatment at this time.  He denies any neuropathy of the hands.  He remains in good spirits.  A full 10 point ROS is otherwise negative.  MEDICAL HISTORY:  Past Medical History:  Diagnosis Date   Adrenal insufficiency (HCC)    on chronic dexamethasone    Anemia    Cancer (HCC)    Coagulopathy (HCC)    on xeralto/ s/p DVT while on coumadin,  IVC in place   Diabetes mellitus without complication (HCC)    type 2   Glaucoma 12/22/2022   Gross hematuria 01/2013   post foley cath procedure   History of blood transfusion 01/2013   Lower GI bleeding 04/18/2017   Multiple myeloma    thoracic T8 with paraplegia s/p resection- on chemo at visit 10/13/10   Multiple myeloma    Multiple myeloma without mention of remission    Neurogenic bladder    Neurogenic bowel    OSA (obstructive sleep apnea) 11/01/2022   Paraplegia (HCC)    Partial small bowel obstruction (HCC) during dec 2011 admission    SURGICAL  HISTORY: Past Surgical History:  Procedure Laterality Date   COLONOSCOPY WITH PROPOFOL  N/A 04/12/2017   Procedure: COLONOSCOPY WITH PROPOFOL ;  Surgeon: Tobin Forts, MD;  Location: [facility];  Service: Endoscopy;  Laterality: N/A;   COLONOSCOPY WITH PROPOFOL  N/A 04/19/2017   Procedure: COLONOSCOPY WITH PROPOFOL ;  Surgeon: Ace Holder, MD;  Location: [facility];  Service: Gastroenterology;  Laterality: N/A;   COLOSTOMY  07/20/2011   Procedure: COLOSTOMY;  Surgeon: Gorman Laughter, DO;  [facility] OR;  Service: General;;   COLOSTOMY REVISION  07/20/2011   Procedure: COLON RESECTION SIGMOID;  Surgeon: Gorman Laughter, DO;  Location: [facility] OR;  Service: General;;   CYSTOSCOPY N/A 04/04/2013   Procedure: CYSTOSCOPY WITH LITHALOPAXY;  Surgeon: Osborn Blaze, MD;  Location: [facility];  Service: Urology;  Laterality: N/A;   INCISION AND DRAINAGE ABSCESS N/A 12/25/2022   Procedure: INCISION AND DRAINAGE OF SCROTUM;  Surgeon: Mallie Seal, MD;  Location: [facility];  Service: Urology;  Laterality: N/A;   INSERTION OF SUPRAPUBIC CATHETER N/A 04/04/2013   Procedure: INSERTION OF SUPRAPUBIC CATHETER;  Surgeon: Osborn Blaze, MD;  Location: [facility];  Service: Urology;  Laterality: N/A;   LAPAROTOMY  07/20/2011   Procedure: EXPLORATORY LAPAROTOMY;  Surgeon: Gorman Laughter, DO;  Location: [facility] OR;  Service: General;  Laterality: N/A;   myeloma thoracic T8 with parpaplegia s/p thoracotomy and thoracic T7-9 cage placement on Dec 26th 2011  07/18/10   SCROTAL EXPLORATION N/A 12/29/2022   Procedure: SCROTAL WOUND DEBRIDEMENT AND CLOSURE;  Surgeon: Melody Spurling., MD;  Location: [facility];  Service: Urology;  Laterality: N/A;    SOCIAL HISTORY: Social History   Socioeconomic History   Marital status: Married    Spouse name: Not on file   Number of children: Not on file   Years of education: Not on file   Highest education level: Not on file  Occupational History   Not on file  Tobacco  Use   Smoking status: Never   Smokeless tobacco: Never  Vaping Use   Vaping status: Never Used  Substance and Sexual Activity   Alcohol use: No   Drug use: No   Sexual activity: Never  Other Topics Concern   Not on file  Social History Narrative   Not on file   Social Drivers of Health   Financial Resource Strain: Not on file  Food Insecurity: No Food Insecurity (12/22/2022)   Hunger Vital Sign    Worried About Running Out of Food in the Last Year: Never true    Ran Out of Food in the Last Year: Never true  Transportation Needs: No Transportation Needs (12/22/2022)   PRAPARE - Administrator, Civil Service (Medical): No    Lack of Transportation (Non-Medical): No  Physical Activity: Not on file  Stress: Not on file  Social Connections: Not on file  Intimate Partner Violence: Not At Risk (12/22/2022)   Humiliation, Afraid, Rape, and Kick questionnaire    Fear of Current or Ex-Partner: No    Emotionally Abused: No    Physically Abused: No    Sexually Abused: No    FAMILY HISTORY: Family History  Problem Relation Age of Onset   Ovarian cancer Mother    Diabetes Father     ALLERGIES:  is allergic to ferumoxytol .  MEDICATIONS:  Current Outpatient Medications  Medication Sig Dispense Refill   acetaminophen  (TYLENOL ) 325 MG tablet Take 325-650 mg by mouth every 6 (six) hours as needed for headache or mild pain.     acetic acid 0.25 % irrigation Irrigate with 1 Application as directed See admin instructions. Instill 50 ml's, clamp for 10 minutes, then remove clamp and drain the bladder. Repeat 3 times a week- when not using the Renacidin irrigation     acyclovir  (ZOVIRAX ) 400 MG tablet Take 1 tablet (400 mg total) by mouth 2 (two) times daily. 60 tablet 2   albuterol  (VENTOLIN  HFA) 108 (90 Base) MCG/ACT inhaler Inhale 2 puffs into the lungs every 4 (four) hours as needed for wheezing or shortness of breath.     atorvastatin  (LIPITOR) 20 MG tablet Take 20 mg by  mouth daily.     BENADRYL  ALLERGY 25 MG tablet Take 25 mg by mouth every 6 (six) hours as needed (for allergic reactions).     BIOFREEZE 4 % GEL Apply 1 application  topically every 4 (four) hours as needed (for joint pain- to intact areas of the skin only).     Citric Ac-Gluconolact-Mg Carb (RENACIDIN IR) Irrigate with 30 mLs as directed See admin instructions. Instill 30  ml's, clamp for 10 minutes, then remove clamp and drain the bladder. Repeat 3 times a week- when not using the acetic acid irrigation     COSOPT  PF 2-0.5 % SOLN Place 1 drop into both eyes 2 (two) times daily.     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     Emollient (EUCERIN EX) Apply 1 application  topically See admin instructions. Apply to affected area 2 times a day     FLOVENT HFA 110 MCG/ACT inhaler 2 each See admin instructions. 2 sprays to colostomy stoma prior to each colostomy bag change     guaifenesin  (ROBITUSSIN) 100 MG/5ML syrup Take 200 mg by mouth 4 (four) times daily as needed for cough.     iron  polysaccharides (NU-IRON ) 150 MG capsule Take 1 capsule (150 mg total) by mouth daily. 30 capsule 0   latanoprost  (XALATAN ) 0.005 % ophthalmic solution Place 1 drop into both eyes at bedtime.     Multiple Vitamin (MULTIVITAMIN WITH MINERALS) TABS tablet Take 1 tablet by mouth daily. 30 tablet 0   NON FORMULARY Apply 1 application  topically See admin instructions. Colo Plast paste- Apply as directed with each ostomy bag change     ondansetron  (ZOFRAN ) 4 MG tablet Take 1 tablet (4 mg) by mouth every 6 hours as needed for nausea. 20 tablet 0   rivaroxaban  (XARELTO ) 10 MG TABS tablet Take 1 tablet (10 mg total) by mouth daily with supper. (Patient taking differently: Take 10 mg by mouth daily.) 30 tablet 9   Skin Protectants, Misc. (MINERIN CREME) CREA Apply to affected areas 2 times daily as needed. 454 g 0   Zinc  Oxide (BALMEX EX) Apply 1  application  topically See admin instructions. Apply topically daily to affected sites     No current facility-administered medications for this visit.   Facility-Administered Medications Ordered in Other Visits  Medication Dose Route Frequency Provider Last Rate Last Admin   sodium chloride  flush (NS) 0.9 % injection 10 mL  10 mL Intravenous PRN Marton Sleeper, Ni, MD        REVIEW OF SYSTEMS:   Constitutional: ( - ) fevers, ( - )  chills , ( - ) night sweats Eyes: ( - ) blurriness of vision, ( - ) double vision, ( - ) watery eyes Ears, nose, mouth, throat, and face: ( - ) mucositis, ( - ) sore throat Respiratory: ( - ) cough, ( - ) dyspnea, ( - ) wheezes Cardiovascular: ( - ) palpitation, ( - ) chest discomfort, ( - ) lower extremity swelling Gastrointestinal:  ( - ) nausea, ( - ) heartburn, ( - ) change in bowel habits Skin: ( - ) abnormal skin rashes Lymphatics: ( - ) new lymphadenopathy, ( - ) easy bruising Neurological: ( - ) numbness, ( - ) tingling, ( - ) new weaknesses Behavioral/Psych: ( - ) mood change, ( - ) new changes  All other systems were reviewed with the patient and are negative.  PHYSICAL EXAMINATION: ECOG PERFORMANCE STATUS: paraplegic.   Vitals:   01/04/24 1143  BP: (!) 142/71  Pulse: 74  Resp: 14  Temp: (!) 97.2 F (36.2 C)  SpO2: 99%     Filed Weights   01/04/24 1143  Weight: 247 lb (112 kg)       GENERAL: well appearing middle aged [place] American male alert, no distress and comfortable SKIN: skin color,  texture, turgor are normal, no rashes or significant lesions EYES: conjunctiva are pink and non-injected, sclera clear LUNGS: clear to auscultation and percussion with normal breathing effort HEART: regular rate & rhythm and no murmurs Musculoskeletal: no cyanosis of digits and no clubbing  PSYCH: alert & oriented x 3, fluent speech NEURO: paraplegic, no use of LE bilaterally.   LABORATORY DATA:  I have reviewed the data as listed    Latest  Ref Rng & Units 01/04/2024   11:00 AM 12/28/2023    9:22 AM 12/21/2023   10:13 AM  CBC  WBC 4.0 - 10.5 K/uL 10.0  11.5  8.3   Hemoglobin 13.0 - 17.0 g/dL 28.4  13.2  44.0   Hematocrit 39.0 - 52.0 % 34.8  34.2  32.9   Platelets 150 - 400 K/uL 184  182  373        Latest Ref Rng & Units 01/04/2024   11:00 AM 12/28/2023    9:22 AM 12/21/2023   10:13 AM  CMP  Glucose 70 - 99 mg/dL 102  97  725   BUN 8 - 23 mg/dL 11  11  12    Creatinine 0.61 - 1.24 mg/dL 3.66  4.40  3.47   Sodium 135 - 145 mmol/L 145  143  145   Potassium 3.5 - 5.1 mmol/L 3.6  3.4  3.7   Chloride 98 - 111 mmol/L 110  110  111   CO2 22 - 32 mmol/L 29  29  30    Calcium  8.9 - 10.3 mg/dL 9.4  8.9  9.4   Total Protein 6.5 - 8.1 g/dL 6.3  6.1  6.3   Total Bilirubin 0.0 - 1.2 mg/dL 0.7  0.8  0.5   Alkaline Phos 38 - 126 U/L 91  96  100   AST 15 - 41 U/L 13  11  13    ALT 0 - 44 U/L 15  13  15      Lab Results  Component Value Date   MPROTEIN Not Observed 12/21/2023   MPROTEIN Not Observed 11/23/2023   MPROTEIN 0.1 (H) 10/26/2023   Lab Results  Component Value Date   KPAFRELGTCHN 7.4 12/21/2023   KPAFRELGTCHN 11.7 11/23/2023   KPAFRELGTCHN 19.8 (H) 10/26/2023   LAMBDASER 3.6 (L) 12/21/2023   LAMBDASER 6.5 11/23/2023   LAMBDASER 12.5 10/26/2023   KAPLAMBRATIO 2.06 (H) 12/21/2023   KAPLAMBRATIO 1.80 (H) 11/23/2023   KAPLAMBRATIO 1.58 10/26/2023    RADIOGRAPHIC STUDIES: No results found.    ASSESSMENT & PLAN [name] is a 68 y.o.  male with medical history significant for  IgG Lambda Multiple Myeloma who presents for a follow up visit.  # IgG Lambda Multiple Myeloma, Relapsed (ISS Stage II) --findings are most consistent with relapsed multiple myeloma. Patient previously successfully treated with Velcade /Rev/Dex and Daratumumab /Velcade /Dex. On 07/02/2020 he transitioned to monthly daratumumab  alone.  --due to to rise in M protein, switched to Kyprolis  and Dexamethasone  on 01/06/2022 Plan: --Due for Cycle  25, Day 15 of Kyprolis /Dex today  --Labs show white blood cell count 10.0, hemoglobin 9.7, MCV 82.1, platelets 184. Creatinine and LFTs are in range.  --Most recent myeloma labs from 11/23/2023 showed M protein not detectable with kappa improved from 19.8 to 11.7, lambda light chain 6.5, ratio 1.80. --Proceed with treatment today without any dose modifications. --Continue with weekly labs plus Kyprolis /Dex treatment as scheduled  #Anemia: --Likely secondary to MM +/- iron  deficiency --Currently on PO iron  therapy daily --Will recheck iron  panel periodically.   #  Upper respiratory symptoms --No improvement with Z-pack --Suspect allergies, advised to take Zyrtec plus Flonase  # Memory loss: --Encouraged to do memory exercises including puzzles, suduko, etc --Likely secondary to chemotherapy  --If symptoms worsen, we can make a referral to neurology for further evaluation.   #History of Sepsis 2/2 UTI and Fournier's gangrene of scrotum: --Hospitalized from 12/22/2022-01/08/2023. Underwent debridement of necrotic tissue. --Given PIP tazobactam and Zyvox  initially --Urine culture reveals Proteus mirabilis which is resistant to ciprofloxacin  and nitrofurantoin --Blood cultures negative --D/C on remainder doxycycline  and Augmentin  which he completed on 01/07/2023.  --Evaluated by urology on 7/2 and ID on 7/3 with continued wound healing and no further d/c or evidence of infection.   #History of DVT --He had placement of IVC filter, remains on Xarelto . --Due to poor mobility, and lack of bleeding complications, recommend to continue on Xarelto  indefinitely. --caution if Plt count were to drop <50  #Snoring/Risk for OSA: --Diagnosed with OSA and has CPAP machine.   # Supportive Care -- provided patient with an albuterol  inhaler (for use with daratumumab ) --acyclovir  400mg  BID for VZV prophylaxis --zofran  8mg  q8H PRN for nausea/vomiting  --Zometa  is being held in the setting of his prior  episode of osteonecrosis of the jaw  No orders of the defined types were placed in this encounter.   All questions were answered. The patient knows to call the clinic with any problems, questions or concerns.  I have spent a total of 30 minutes minutes of face-to-face and non-face-to-face time, preparing to see the patient,  performing a medically appropriate examination, counseling and educating the patient, communicating with other health care professionals, documenting clinical information in the electronic health record,  and care coordination.   Rogerio Clay, MD Department of Hematology/Oncology [facility] Phone: [phone] Pager: [phone] Email: [email or web address]  01/06/2024 3:04 PM   Literature Support:  Bringhen S, Mina R, Petrucci MT, Gaidano G, Ballanti S, Musto P, Offidani M, Spada S, Benevolo G, Ponticelli E, Galieni P, Cavo M, Di Toritto TC, Di Raimondo F, Montefusco V, Palumbo A, Boccadoro M, Larocca A. Once-weekly versus twice-weekly carfilzomib  in patients with newly diagnosed multiple myeloma: a pooled analysis of two phase I/II studies. Haematologica. 2019 Aug;104(8):1640-1647.  --Once-weekly 70 mg/m2 carfilzomib  as induction and maintenance therapy for newly diagnosed multiple myeloma patients was as safe and effective as twice-weekly 36 mg/m2 carfilzomib  and provided a more convenient schedule.

## 2024-01-04 NOTE — Patient Instructions (Signed)
 [facility] - A DEPT OF [facility]  Discharge Instructions: Thank you for choosing [facility] to provide your oncology and hematology care.   If you have a lab appointment with the Cancer Center, please go directly to the Cancer Center and check in at the registration area.   Wear comfortable clothing and clothing appropriate for easy access to any Portacath or PICC line.   We strive to give you quality time with your provider. You may need to reschedule your appointment if you arrive late (15 or more minutes).  Arriving late affects you and other patients whose appointments are after yours.  Also, if you miss three or more appointments without notifying the office, you may be dismissed from the clinic at the provider's discretion.      For prescription refill requests, have your pharmacy contact our office and allow 72 hours for refills to be completed.    Today you received the following chemotherapy and/or immunotherapy agents Kyprolis      To help prevent nausea and vomiting after your treatment, we encourage you to take your nausea medication as directed.  BELOW ARE SYMPTOMS THAT SHOULD BE REPORTED IMMEDIATELY: *FEVER GREATER THAN 100.4 F (38 C) OR HIGHER *CHILLS OR SWEATING *NAUSEA AND VOMITING THAT IS NOT CONTROLLED WITH YOUR NAUSEA MEDICATION *UNUSUAL SHORTNESS OF BREATH *UNUSUAL BRUISING OR BLEEDING *URINARY PROBLEMS (pain or burning when urinating, or frequent urination) *BOWEL PROBLEMS (unusual diarrhea, constipation, pain near the anus) TENDERNESS IN MOUTH AND THROAT WITH OR WITHOUT PRESENCE OF ULCERS (sore throat, sores in mouth, or a toothache) UNUSUAL RASH, SWELLING OR PAIN  UNUSUAL VAGINAL DISCHARGE OR ITCHING   Items with * indicate a potential emergency and should be followed up as soon as possible or go to the Emergency Department if any problems should occur.  Please show the CHEMOTHERAPY ALERT CARD or IMMUNOTHERAPY  ALERT CARD at check-in to the Emergency Department and triage nurse.  Should you have questions after your visit or need to cancel or reschedule your appointment, please contact [facility] - A DEPT OF [facility]  Dept: [phone]  and follow the prompts.  Office hours are 8:00 a.m. to 4:30 p.m. Monday - Friday. Please note that voicemails left after 4:00 p.m. may not be returned until the following business day.  We are closed weekends and major holidays. You have access to a nurse at all times for urgent questions. Please call the main number to the clinic Dept: [phone] and follow the prompts.   For any non-urgent questions, you may also contact your provider using MyChart. We now offer e-Visits for anyone 49 and older to request care online for non-urgent symptoms. For details visit [email or web address].   Also download the MyChart app! Go to the app store, search "MyChart", open the app, select [facility] [place], and log in with your MyChart username and password.

## 2024-01-06 ENCOUNTER — Encounter: Payer: Self-pay | Admitting: Hematology and Oncology

## 2024-01-09 ENCOUNTER — Encounter: Payer: Self-pay | Admitting: Hematology and Oncology

## 2024-01-17 ENCOUNTER — Ambulatory Visit
Admission: RE | Admit: 2024-01-17 | Discharge: 2024-01-17 | Disposition: A | Discharge status: Home / Self Care | Payer: Medicare (Managed Care) | Source: Ambulatory Visit | Attending: Internal Medicine | Admitting: Internal Medicine

## 2024-01-17 ENCOUNTER — Encounter: Payer: Self-pay | Admitting: Hematology and Oncology

## 2024-01-17 ENCOUNTER — Other Ambulatory Visit: Payer: Self-pay | Admitting: Internal Medicine

## 2024-01-17 ENCOUNTER — Other Ambulatory Visit: Payer: Self-pay

## 2024-01-17 DIAGNOSIS — J209 Acute bronchitis, unspecified: Secondary | ICD-10-CM

## 2024-01-18 ENCOUNTER — Inpatient Hospital Stay: Payer: Medicare (Managed Care)

## 2024-01-18 ENCOUNTER — Inpatient Hospital Stay (HOSPITAL_BASED_OUTPATIENT_CLINIC_OR_DEPARTMENT_OTHER): Payer: Medicare (Managed Care) | Admitting: Hematology and Oncology

## 2024-01-18 VITALS — BP 153/78 | HR 80 | Temp 97.8°F | Resp 16 | Wt 248.0 lb

## 2024-01-18 DIAGNOSIS — C9002 Multiple myeloma in relapse: Secondary | ICD-10-CM

## 2024-01-18 DIAGNOSIS — Z5111 Encounter for antineoplastic chemotherapy: Secondary | ICD-10-CM | POA: Diagnosis not present

## 2024-01-18 DIAGNOSIS — Z95828 Presence of other vascular implants and grafts: Secondary | ICD-10-CM | POA: Diagnosis not present

## 2024-01-18 LAB — CBC WITH DIFFERENTIAL (CANCER CENTER ONLY)
Abs Immature Granulocytes: 0.04 10*3/uL (ref 0.00–0.07)
Basophils Absolute: 0.1 10*3/uL (ref 0.0–0.1)
Basophils Relative: 1 %
Eosinophils Absolute: 0.3 10*3/uL (ref 0.0–0.5)
Eosinophils Relative: 4 %
HCT: 32.1 % — ABNORMAL LOW (ref 39.0–52.0)
Hemoglobin: 10.7 g/dL — ABNORMAL LOW (ref 13.0–17.0)
Immature Granulocytes: 1 %
Lymphocytes Relative: 12 %
Lymphs Abs: 0.9 10*3/uL (ref 0.7–4.0)
MCH: 27.3 pg (ref 26.0–34.0)
MCHC: 33.3 g/dL (ref 30.0–36.0)
MCV: 81.9 fL (ref 80.0–100.0)
Monocytes Absolute: 0.7 10*3/uL (ref 0.1–1.0)
Monocytes Relative: 9 %
Neutro Abs: 5.6 10*3/uL (ref 1.7–7.7)
Neutrophils Relative %: 73 %
Platelet Count: 372 10*3/uL (ref 150–400)
RBC: 3.92 MIL/uL — ABNORMAL LOW (ref 4.22–5.81)
RDW: 18 % — ABNORMAL HIGH (ref 11.5–15.5)
WBC Count: 7.6 10*3/uL (ref 4.0–10.5)
nRBC: 0 % (ref 0.0–0.2)

## 2024-01-18 LAB — CMP (CANCER CENTER ONLY)
ALT: 14 U/L (ref 0–44)
AST: 14 U/L — ABNORMAL LOW (ref 15–41)
Albumin: 3.9 g/dL (ref 3.5–5.0)
Alkaline Phosphatase: 88 U/L (ref 38–126)
Anion gap: 6 (ref 5–15)
BUN: 8 mg/dL (ref 8–23)
CO2: 28 mmol/L (ref 22–32)
Calcium: 9.4 mg/dL (ref 8.9–10.3)
Chloride: 111 mmol/L (ref 98–111)
Creatinine: 0.64 mg/dL (ref 0.61–1.24)
GFR, Estimated: >60 mL/min (ref >60)
Glucose, Bld: 111 mg/dL — ABNORMAL HIGH (ref 70–99)
Potassium: 3.4 mmol/L — ABNORMAL LOW (ref 3.5–5.1)
Sodium: 145 mmol/L (ref 135–145)
Total Bilirubin: 0.8 mg/dL (ref 0.0–1.2)
Total Protein: 6.5 g/dL (ref 6.5–8.1)

## 2024-01-18 MED ORDER — SODIUM CHLORIDE 0.9 % IV SOLN
40.0000 mg | Freq: Once | INTRAVENOUS | Status: AC
  Administered 2024-01-18: 40 mg via INTRAVENOUS
  Filled 2024-01-18: qty 4

## 2024-01-18 MED ORDER — SODIUM CHLORIDE 0.9 % IV SOLN: Freq: Once | INTRAVENOUS | Status: AC

## 2024-01-18 MED ORDER — DEXTROSE 5 % IV SOLN
70.0000 mg/m2 | Freq: Once | INTRAVENOUS | Status: AC
  Administered 2024-01-18: 150 mg via INTRAVENOUS
  Filled 2024-01-18: qty 60

## 2024-01-18 NOTE — Patient Instructions (Signed)
 [facility] - A DEPT OF [facility]  Discharge Instructions: Thank you for choosing [facility] to provide your oncology and hematology care.   If you have a lab appointment with the Cancer Center, please go directly to the Cancer Center and check in at the registration area.   Wear comfortable clothing and clothing appropriate for easy access to any Portacath or PICC line.   We strive to give you quality time with your provider. You may need to reschedule your appointment if you arrive late (15 or more minutes).  Arriving late affects you and other patients whose appointments are after yours.  Also, if you miss three or more appointments without notifying the office, you may be dismissed from the clinic at the provider's discretion.      For prescription refill requests, have your pharmacy contact our office and allow 72 hours for refills to be completed.    Today you received the following chemotherapy and/or immunotherapy agents Kyprolis      To help prevent nausea and vomiting after your treatment, we encourage you to take your nausea medication as directed.  BELOW ARE SYMPTOMS THAT SHOULD BE REPORTED IMMEDIATELY: *FEVER GREATER THAN 100.4 F (38 C) OR HIGHER *CHILLS OR SWEATING *NAUSEA AND VOMITING THAT IS NOT CONTROLLED WITH YOUR NAUSEA MEDICATION *UNUSUAL SHORTNESS OF BREATH *UNUSUAL BRUISING OR BLEEDING *URINARY PROBLEMS (pain or burning when urinating, or frequent urination) *BOWEL PROBLEMS (unusual diarrhea, constipation, pain near the anus) TENDERNESS IN MOUTH AND THROAT WITH OR WITHOUT PRESENCE OF ULCERS (sore throat, sores in mouth, or a toothache) UNUSUAL RASH, SWELLING OR PAIN  UNUSUAL VAGINAL DISCHARGE OR ITCHING   Items with * indicate a potential emergency and should be followed up as soon as possible or go to the Emergency Department if any problems should occur.  Please show the CHEMOTHERAPY ALERT CARD or IMMUNOTHERAPY  ALERT CARD at check-in to the Emergency Department and triage nurse.  Should you have questions after your visit or need to cancel or reschedule your appointment, please contact [facility] - A DEPT OF [facility]  Dept: [phone]  and follow the prompts.  Office hours are 8:00 a.m. to 4:30 p.m. Monday - Friday. Please note that voicemails left after 4:00 p.m. may not be returned until the following business day.  We are closed weekends and major holidays. You have access to a nurse at all times for urgent questions. Please call the main number to the clinic Dept: [phone] and follow the prompts.   For any non-urgent questions, you may also contact your provider using MyChart. We now offer e-Visits for anyone 49 and older to request care online for non-urgent symptoms. For details visit [email or web address].   Also download the MyChart app! Go to the app store, search "MyChart", open the app, select [facility] [place], and log in with your MyChart username and password.

## 2024-01-18 NOTE — Progress Notes (Unsigned)
 [facility] [phone]   [phone]  PROGRESS NOTE  Patient Care Team: Cloria Annabella CROME, DO as PCP - General (Geriatric Medicine) Babs Arthea DASEN, MD as Consulting Physician (Physical Medicine and Rehabilitation) Pegge Toribio PARAS, PA-C as Physician Assistant (Physical Medicine and Rehabilitation) Federico Norleen DASEN MADISON, MD as Consulting Physician (Hematology and Oncology) Federico Norleen DASEN MADISON, MD as Consulting Physician (Hematology and Oncology) Federico Norleen DASEN MADISON, MD as Consulting Physician (Hematology and Oncology)  Hematological/Oncological History # IgG Lambda Multiple Myeloma, Relapsed (ISS Stage II) 1) 06/2010: initial diagnosis of Multiple Myeloma after T8 compression fracture. Treated with Velcade /Revlimid /Dexamethasone  and achieved a complete remission 2) Velcade  was discontinued in September 2012 and that Revlimid  and Decadron  were discontinued in March 2013. 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 4) Followed by Dr. Lonn, last clinic visit 10/09/2019. At that time there was concern for relapse of his multiple myeloma.  5) Patient requested transfer to different provider after misunderstanding regarding imaging studies 6) 12/17/2019: transfer care to Dr. Federico  7) 01/09/2020: Cycle 1 Day 1 of Dara/Velcade /Dex 8) 01/21/2020: presented as urgent visit for diarrhea and dehydration. Holding chemotherapy scheduled for 01/23/2020. 9) 01/30/2020: Resume dara/velcade /dex after resolution of diarrhea.  10) 02/13/2020: restaging labs show M protein 0.8, Kappa 4.5, lamba 17.2, ratio 0.26, urine M protein 53 (7.1%). All MM labs indicate improvement.  11) 03/10/2020: Cycle 4 Day 1 of Dara/Velcade /Dex. Transition to q 3 week daratumumab .  12) 06/04/2020:  Cycle 8 Day 1 of Dara/Velcade /Dex 13) 06/25/2020:  Cycle 9 Day 1 of Dara/Velcade /Dex 14) 07/22/2020: Cycle 10 Day  1 of  Dara//Dex 15) 08/18/2020: Cycle 11 Day 1 of Dara//Dex 16) 09/23/2020: Cycle 12 Day 1 of Dara//Dex 17) 10/22/2020: Cycle 13 Day 1 of Dara/Dex  18) 11/19/2020: Cycle 14 Day 1 of Dara/Dex  19) 12/17/2020: Cycle 15 Day 1 of Dara/Dex  20) 01/17/2021: Cycle 16 Day 1 of Dara/Dex  21) 02/11/2021: Cycle 17 Day 1 of Dara/Dex 22) 03/10/2021: Cycle 18 Day 1 of Dara/Dex  23) 04/08/2021: Cycle 19 Day 1 of Dara/Dex  24) 08/03/2021: Cycle 20 Day 1 of Dara/Dex (delayed due to scheduling error) 25) 09/02/2021: Cycle 21 Day 1 of Dara/Dex 26) 09/30/2021: Cycle 22 Day 1 of Dara/Dex 27) 10/28/2021: Cycle 23 Day 1 of Dara/Dex 28) 12/02/2021: Cycle 24 Day 1 of Dara/Dex 29) 12/30/2021: Cycle 25 Day 1 of Dara/Dex 30) 01/06/2022: Cycle 1 Day 1 of Kyprolis /Dex 31) 02/03/2022: Cycle 2 Day 1 of Kyprolis /Dex 32) 02/24/2022: Cycle 3 Day 1 of Kyprolis /Dex 33) 04/07/2022: Cycle 4 Day 1 of Kyprolis /Dex 34) 05/05/2022: Cycle 5 Day 1 of Kyprolis /Dex 35) 06/01/2022: Cycle 6 Day 1 of Kyprolis /Dex 36) 06/29/2022: Cycle 7 Day 1 of Kyprolis /Dex 37) 07/28/2022: Cycle 8 Day 1 of Kyprolis /Dex 38) 08/25/2022: Cycle 9 Day 1 of Kyprolis /Dex 39) 09/22/2022: Cycle 10 Day 1 of Kyprolis /Dex 40) 10/20/2022: Cycle 11 Day 1 of Kyprolis /Dex 41) 11/17/2022: Cycle 12 Day 1 of Kyprolis /Dex 42 ) 12/22/2022-01/08/2023: Admitted for fournier's gangrene of scrotum.  43) 01/26/2023: Cycle 13 Day 1 of Kyprolis /Dex 44) 02/16/2023: Cycle 14 Day 1 of Kyprolis /Dex 45) 03/16/2023:Cycle 15 Day 1 of Kyprolis /Dex 46) 04/13/2023: Cycle 16 Day 1 of Kyprolis /Dex 47) 05/11/2023: Cycle 17 Day 1 of Kyprolis /Dex 48) 06/07/2023: Cycle 18 Day 1 of Kyprolis /Dex 49) 07/06/2023: Cycle 19 Day 1 of Kyprolis /Dex 50) 08/02/2023: Cycle 20 Day 1 of Kyprolis /Dex 51) 08/31/2023: Cycle 21 Day 1 of  Kyprolis /Dex 52) 09/28/2023: Cycle 22 Day 1 of Kyprolis /Dex 53) 10/26/2023: Cycle 23 Day 1 of Kyprolis /Dex 54) 11/23/2023: Cycle 24 Day 1 of Kyprolis /Dex  Interval History:  [name] 68 y.o. male with  medical history significant for  IgG Lambda Multiple Myeloma who presents for a follow up visit. The patient's last visit was on 01/04/2024.  He presents today prior to treatment with Kyprolis /Dex. He is unaccompanied for this visit.   On exam today [name] reports he has been having some trouble with bronchitis since her last visit.  He has been on antibiotics and cold syrup.  He notes that it is improving but he does still have a lot of congestion.  He is having some also runny nose but no fevers, chills, sweats.  He notes he is not having any issues with his appetite.  He is looking forward to the upcoming Fourth of July weekend does not have any special plans.  Overall he is willing and able to continue on the Kyprolis  therapy.  He is not having any side effects as a result of this medication.  A full 10 point ROS is otherwise negative.  MEDICAL HISTORY:  Past Medical History:  Diagnosis Date   Adrenal insufficiency (HCC)    on chronic dexamethasone    Anemia    Cancer (HCC)    Coagulopathy (HCC)    on xeralto/ s/p DVT while on coumadin,  IVC in place   Diabetes mellitus without complication (HCC)    type 2   Glaucoma 12/22/2022   Gross hematuria 01/2013   post foley cath procedure   History of blood transfusion 01/2013   Lower GI bleeding 04/18/2017   Multiple myeloma    thoracic T8 with paraplegia s/p resection- on chemo at visit 10/13/10   Multiple myeloma    Multiple myeloma without mention of remission    Neurogenic bladder    Neurogenic bowel    OSA (obstructive sleep apnea) 11/01/2022   Paraplegia (HCC)    Partial small bowel obstruction (HCC) during dec 2011 admission    SURGICAL HISTORY: Past Surgical History:  Procedure Laterality Date   COLONOSCOPY WITH PROPOFOL  N/A 04/12/2017   Procedure: COLONOSCOPY WITH PROPOFOL ;  Surgeon: Abran Norleen SAILOR, MD;  Location: [facility];  Service: Endoscopy;  Laterality: N/A;   COLONOSCOPY WITH PROPOFOL  N/A 04/19/2017   Procedure:  COLONOSCOPY WITH PROPOFOL ;  Surgeon: Leigh Elspeth SQUIBB, MD;  Location: [facility];  Service: Gastroenterology;  Laterality: N/A;   COLOSTOMY  07/20/2011   Procedure: COLOSTOMY;  Surgeon: Redell Alm Faith, DO;  Location: [facility] OR;  Service: General;;   COLOSTOMY REVISION  07/20/2011   Procedure: COLON RESECTION SIGMOID;  Surgeon: Redell Alm Faith, DO;  Location[facility] OR;  Service: General;;   CYSTOSCOPY N/A 04/04/2013   Procedure: CYSTOSCOPY WITH LITHALOPAXY;  Surgeon: Ricardo Likens, MD;  Location: [facility];  Service: Urology;  Laterality: N/A;   INCISION AND DRAINAGE ABSCESS N/A 12/25/2022   Procedure: INCISION AND DRAINAGE OF SCROTUM;  Surgeon: Lovie Arlyss CROME, MD;  Location: [facility];  Service: Urology;  Laterality: N/A;   INSERTION OF SUPRAPUBIC CATHETER N/A 04/04/2013   Procedure: INSERTION OF SUPRAPUBIC CATHETER;  Surgeon: Ricardo Likens, MD;  Location: [facility];  Service: Urology;  Laterality: N/A;   LAPAROTOMY  07/20/2011   Procedure: EXPLORATORY LAPAROTOMY;  Surgeon: Redell Alm Faith, DO;  Location: [facility] OR;  Service: General;  Laterality: N/A;   myeloma thoracic T8 with parpaplegia s/p thoracotomy and thoracic T7-9 cage placement on  Dec 26th 2011  07/18/10   SCROTAL EXPLORATION N/A 12/29/2022   Procedure: SCROTAL WOUND DEBRIDEMENT AND CLOSURE;  Surgeon: Alvaro Ricardo KATHEE Mickey., MD;  Location: [facility];  Service: Urology;  Laterality: N/A;    SOCIAL HISTORY: Social History   Socioeconomic History   Marital status: Married    Spouse name: Not on file   Number of children: Not on file   Years of education: Not on file   Highest education level: Not on file  Occupational History   Not on file  Tobacco Use   Smoking status: Never   Smokeless tobacco: Never  Vaping Use   Vaping status: Never Used  Substance and Sexual Activity   Alcohol use: No   Drug use: No   Sexual activity: Never  Other Topics Concern   Not on file  Social History Narrative   Not on file   Social Drivers of  Health   Financial Resource Strain: Not on file  Food Insecurity: No Food Insecurity (12/22/2022)   Hunger Vital Sign    Worried About Running Out of Food in the Last Year: Never true    Ran Out of Food in the Last Year: Never true  Transportation Needs: No Transportation Needs (12/22/2022)   PRAPARE - Administrator, Civil Service (Medical): No    Lack of Transportation (Non-Medical): No  Physical Activity: Not on file  Stress: Not on file  Social Connections: Not on file  Intimate Partner Violence: Not At Risk (12/22/2022)   Humiliation, Afraid, Rape, and Kick questionnaire    Fear of Current or Ex-Partner: No    Emotionally Abused: No    Physically Abused: No    Sexually Abused: No    FAMILY HISTORY: Family History  Problem Relation Age of Onset   Ovarian cancer Mother    Diabetes Father     ALLERGIES:  is allergic to ferumoxytol .  MEDICATIONS:  Current Outpatient Medications  Medication Sig Dispense Refill   acetaminophen  (TYLENOL ) 325 MG tablet Take 325-650 mg by mouth every 6 (six) hours as needed for headache or mild pain.     acetic acid 0.25 % irrigation Irrigate with 1 Application as directed See admin instructions. Instill 50 ml's, clamp for 10 minutes, then remove clamp and drain the bladder. Repeat 3 times a week- when not using the Renacidin irrigation     acyclovir  (ZOVIRAX ) 400 MG tablet Take 1 tablet (400 mg total) by mouth 2 (two) times daily. 60 tablet 2   albuterol  (VENTOLIN  HFA) 108 (90 Base) MCG/ACT inhaler Inhale 2 puffs into the lungs every 4 (four) hours as needed for wheezing or shortness of breath.     atorvastatin  (LIPITOR) 20 MG tablet Take 20 mg by mouth daily.     BENADRYL  ALLERGY 25 MG tablet Take 25 mg by mouth every 6 (six) hours as needed (for allergic reactions).     BIOFREEZE 4 % GEL Apply 1 application  topically every 4 (four) hours as needed (for joint pain- to intact areas of the skin only).     Citric Ac-Gluconolact-Mg Carb  (RENACIDIN IR) Irrigate with 30 mLs as directed See admin instructions. Instill 30 ml's, clamp for 10 minutes, then remove clamp and drain the bladder. Repeat 3 times a week- when not using the acetic acid irrigation     COSOPT  PF 2-0.5 % SOLN Place 1 drop into both eyes 2 (two) times daily.     diazepam  (VALIUM ) 5 MG tablet Take 5 mg  by mouth 2 (two) times daily as needed for muscle spasms (after spinal cord injury).     Emollient (EUCERIN EX) Apply 1 application  topically See admin instructions. Apply to affected area 2 times a day     FLOVENT HFA 110 MCG/ACT inhaler 2 each See admin instructions. 2 sprays to colostomy stoma prior to each colostomy bag change     guaifenesin  (ROBITUSSIN) 100 MG/5ML syrup Take 200 mg by mouth 4 (four) times daily as needed for cough.     iron  polysaccharides (NU-IRON ) 150 MG capsule Take 1 capsule (150 mg total) by mouth daily. 30 capsule 0   latanoprost  (XALATAN ) 0.005 % ophthalmic solution Place 1 drop into both eyes at bedtime.     Multiple Vitamin (MULTIVITAMIN WITH MINERALS) TABS tablet Take 1 tablet by mouth daily. 30 tablet 0   NON FORMULARY Apply 1 application  topically See admin instructions. Colo Plast paste- Apply as directed with each ostomy bag change     ondansetron  (ZOFRAN ) 4 MG tablet Take 1 tablet (4 mg) by mouth every 6 hours as needed for nausea. 20 tablet 0   rivaroxaban  (XARELTO ) 10 MG TABS tablet Take 1 tablet (10 mg total) by mouth daily with supper. (Patient taking differently: Take 10 mg by mouth daily.) 30 tablet 9   Skin Protectants, Misc. (MINERIN CREME) CREA Apply to affected areas 2 times daily as needed. 454 g 0   Zinc  Oxide (BALMEX EX) Apply 1 application  topically See admin instructions. Apply topically daily to affected sites     No current facility-administered medications for this visit.   Facility-Administered Medications Ordered in Other Visits  Medication Dose Route Frequency Provider Last Rate Last Admin   sodium  chloride flush (NS) 0.9 % injection 10 mL  10 mL Intravenous PRN Lonn, Ni, MD        REVIEW OF SYSTEMS:   Constitutional: ( - ) fevers, ( - )  chills , ( - ) night sweats Eyes: ( - ) blurriness of vision, ( - ) double vision, ( - ) watery eyes Ears, nose, mouth, throat, and face: ( - ) mucositis, ( - ) sore throat Respiratory: ( - ) cough, ( - ) dyspnea, ( - ) wheezes Cardiovascular: ( - ) palpitation, ( - ) chest discomfort, ( - ) lower extremity swelling Gastrointestinal:  ( - ) nausea, ( - ) heartburn, ( - ) change in bowel habits Skin: ( - ) abnormal skin rashes Lymphatics: ( - ) new lymphadenopathy, ( - ) easy bruising Neurological: ( - ) numbness, ( - ) tingling, ( - ) new weaknesses Behavioral/Psych: ( - ) mood change, ( - ) new changes  All other systems were reviewed with the patient and are negative.  PHYSICAL EXAMINATION: ECOG PERFORMANCE STATUS: paraplegic.   Vitals:   01/18/24 1101  BP: (!) 153/78  Pulse: 80  Resp: 16  Temp: 97.8 F (36.6 C)  SpO2: 100%      Filed Weights   01/18/24 1101  Weight: 248 lb (112.5 kg)        GENERAL: well appearing middle aged [place] American male alert, no distress and comfortable SKIN: skin color, texture, turgor are normal, no rashes or significant lesions EYES: conjunctiva are pink and non-injected, sclera clear LUNGS: clear to auscultation and percussion with normal breathing effort HEART: regular rate & rhythm and no murmurs Musculoskeletal: no cyanosis of digits and no clubbing  PSYCH: alert & oriented x 3, fluent speech NEURO: paraplegic,  no use of LE bilaterally.   LABORATORY DATA:  I have reviewed the data as listed    Latest Ref Rng & Units 01/18/2024    9:48 AM 01/04/2024   11:00 AM 12/28/2023    9:22 AM  CBC  WBC 4.0 - 10.5 K/uL 7.6  10.0  11.5   Hemoglobin 13.0 - 17.0 g/dL 89.2  88.2  88.4   Hematocrit 39.0 - 52.0 % 32.1  34.8  34.2   Platelets 150 - 400 K/uL 372  184  182        Latest Ref Rng &  Units 01/18/2024    9:48 AM 01/04/2024   11:00 AM 12/28/2023    9:22 AM  CMP  Glucose 70 - 99 mg/dL 888  897  97   BUN 8 - 23 mg/dL 8  11  11    Creatinine 0.61 - 1.24 mg/dL 9.35  9.28  9.29   Sodium 135 - 145 mmol/L 145  145  143   Potassium 3.5 - 5.1 mmol/L 3.4  3.6  3.4   Chloride 98 - 111 mmol/L 111  110  110   CO2 22 - 32 mmol/L 28  29  29    Calcium  8.9 - 10.3 mg/dL 9.4  9.4  8.9   Total Protein 6.5 - 8.1 g/dL 6.5  6.3  6.1   Total Bilirubin 0.0 - 1.2 mg/dL 0.8  0.7  0.8   Alkaline Phos 38 - 126 U/L 88  91  96   AST 15 - 41 U/L 14  13  11    ALT 0 - 44 U/L 14  15  13      Lab Results  Component Value Date   MPROTEIN Not Observed 12/21/2023   MPROTEIN Not Observed 11/23/2023   MPROTEIN 0.1 (H) 10/26/2023   Lab Results  Component Value Date   KPAFRELGTCHN 7.4 12/21/2023   KPAFRELGTCHN 11.7 11/23/2023   KPAFRELGTCHN 19.8 (H) 10/26/2023   LAMBDASER 3.6 (L) 12/21/2023   LAMBDASER 6.5 11/23/2023   LAMBDASER 12.5 10/26/2023   KAPLAMBRATIO 2.06 (H) 12/21/2023   KAPLAMBRATIO 1.80 (H) 11/23/2023   KAPLAMBRATIO 1.58 10/26/2023    RADIOGRAPHIC STUDIES: DG Chest 2 View Result Date: 01/17/2024 CLINICAL DATA:  Acute bronchitis. EXAM: CHEST - 2 VIEW COMPARISON:  Chest radiograph dated 10/25/2023. FINDINGS: No focal consolidation, pleural effusion, pneumothorax. Stable cardiac silhouette. No acute osseous pathology. IMPRESSION: No active cardiopulmonary disease. Electronically Signed   By: Vanetta Chou M.D.   On: 01/17/2024 14:55      ASSESSMENT & PLAN [name] is a 68 y.o.  male with medical history significant for  IgG Lambda Multiple Myeloma who presents for a follow up visit.  # IgG Lambda Multiple Myeloma, Relapsed (ISS Stage II) --findings are most consistent with relapsed multiple myeloma. Patient previously successfully treated with Velcade /Rev/Dex and Daratumumab /Velcade /Dex. On 07/02/2020 he transitioned to monthly daratumumab  alone.  --due to to rise in M  protein, switched to Kyprolis  and Dexamethasone  on 01/06/2022 Plan: --Due for Cycle 26, Day 1 of Kyprolis /Dex today  --Labs show white blood cell count 7.6, hemoglobin 10.7, MCV 81.9, platelets 372. Creatinine and LFTs are in range.  --Most recent myeloma labs from 12/21/2023 showed M protein not detectable with kappa 7.4, lambda light chain 3.6, ratio 2.06 --Proceed with treatment today without any dose modifications. --Continue with weekly labs plus Kyprolis /Dex treatment as scheduled  #Anemia: --Likely secondary to MM +/- iron  deficiency --Currently on PO iron  therapy daily --Will recheck iron  panel periodically.   #  Upper respiratory symptoms --No improvement with Z-pack --Suspect allergies, advised to take Zyrtec plus Flonase  # Memory loss: --Encouraged to do memory exercises including puzzles, suduko, etc --Likely secondary to chemotherapy  --If symptoms worsen, we can make a referral to neurology for further evaluation.   #History of Sepsis 2/2 UTI and Fournier's gangrene of scrotum: --Hospitalized from 12/22/2022-01/08/2023. Underwent debridement of necrotic tissue. --Given PIP tazobactam and Zyvox  initially --Urine culture reveals Proteus mirabilis which is resistant to ciprofloxacin  and nitrofurantoin --Blood cultures negative --D/C on remainder doxycycline  and Augmentin  which he completed on 01/07/2023.  --Evaluated by urology on 7/2 and ID on 7/3 with continued wound healing and no further d/c or evidence of infection.   #History of DVT --He had placement of IVC filter, remains on Xarelto . --Due to poor mobility, and lack of bleeding complications, recommend to continue on Xarelto  indefinitely. --caution if Plt count were to drop <50  #Snoring/Risk for OSA: --Diagnosed with OSA and has CPAP machine.   # Supportive Care -- provided patient with an albuterol  inhaler (for use with daratumumab ) --acyclovir  400mg  BID for VZV prophylaxis --zofran  8mg  q8H PRN for  nausea/vomiting  --Zometa  is being held in the setting of his prior episode of osteonecrosis of the jaw  Orders Placed This Encounter  Procedures   Multiple Myeloma Panel (SPEP&IFE w/QIG)    Standing Status:   Future    Expected Date:   03/07/2024    Expiration Date:   03/07/2025   Kappa/lambda light chains    Standing Status:   Future    Expected Date:   03/07/2024    Expiration Date:   03/07/2025   CBC with Differential (Cancer Center Only)    Standing Status:   Future    Expected Date:   03/07/2024    Expiration Date:   03/07/2025   CMP (Cancer Center only)    Standing Status:   Future    Expected Date:   03/07/2024    Expiration Date:   03/07/2025   CBC with Differential (Cancer Center Only)    Standing Status:   Future    Expected Date:   03/14/2024    Expiration Date:   03/14/2025   CMP (Cancer Center only)    Standing Status:   Future    Expected Date:   03/14/2024    Expiration Date:   03/14/2025   CBC with Differential (Cancer Center Only)    Standing Status:   Future    Expected Date:   03/21/2024    Expiration Date:   03/21/2025   CMP (Cancer Center only)    Standing Status:   Future    Expected Date:   03/21/2024    Expiration Date:   03/21/2025   Multiple Myeloma Panel (SPEP&IFE w/QIG)    Standing Status:   Future    Expected Date:   04/04/2024    Expiration Date:   04/04/2025   Kappa/lambda light chains    Standing Status:   Future    Expected Date:   04/04/2024    Expiration Date:   04/04/2025   CBC with Differential (Cancer Center Only)    Standing Status:   Future    Expected Date:   04/04/2024    Expiration Date:   04/04/2025   CMP (Cancer Center only)    Standing Status:   Future    Expected Date:   04/04/2024    Expiration Date:   04/04/2025   CBC with Differential (Cancer Center Only)  Standing Status:   Future    Expected Date:   04/11/2024    Expiration Date:   04/11/2025   CMP (Cancer Center only)    Standing Status:   Future    Expected Date:   04/11/2024     Expiration Date:   04/11/2025   CBC with Differential (Cancer Center Only)    Standing Status:   Future    Expected Date:   04/18/2024    Expiration Date:   04/18/2025   CMP (Cancer Center only)    Standing Status:   Future    Expected Date:   04/18/2024    Expiration Date:   04/18/2025    All questions were answered. The patient knows to call the clinic with any problems, questions or concerns.  I have spent a total of 30 minutes minutes of face-to-face and non-face-to-face time, preparing to see the patient,  performing a medically appropriate examination, counseling and educating the patient, communicating with other health care professionals, documenting clinical information in the electronic health record,  and care coordination.   Norleen IVAR Kidney, MD Department of Hematology/Oncology [facility] Phone: [phone] Pager: [phone] Email: [email or web address]  01/20/2024 12:59 PM   Literature Support:  Bringhen S, Mina R, Petrucci MT, Gaidano G, Ballanti S, Musto P, Offidani M, Spada S, Benevolo G, Ponticelli E, Galieni P, Cavo M, Di Toritto TC, Di Raimondo F, Montefusco V, Palumbo A, Boccadoro M, Larocca A. Once-weekly versus twice-weekly carfilzomib  in patients with newly diagnosed multiple myeloma: a pooled analysis of two phase I/II studies. Haematologica. 2019 Aug;104(8):1640-1647.  --Once-weekly 70 mg/m2 carfilzomib  as induction and maintenance therapy for newly diagnosed multiple myeloma patients was as safe and effective as twice-weekly 36 mg/m2 carfilzomib  and provided a more convenient schedule.

## 2024-01-20 ENCOUNTER — Encounter: Payer: Self-pay | Admitting: Hematology and Oncology

## 2024-01-21 ENCOUNTER — Telehealth: Payer: Self-pay | Admitting: Hematology and Oncology

## 2024-01-21 LAB — MULTIPLE MYELOMA PANEL, SERUM
Albumin SerPl Elph-Mcnc: 3.1 g/dL (ref 2.9–4.4)
Albumin/Glob SerPl: 1.2 (ref 0.7–1.7)
Alpha 1: 0.3 g/dL (ref 0.0–0.4)
Alpha2 Glob SerPl Elph-Mcnc: 0.9 g/dL (ref 0.4–1.0)
B-Globulin SerPl Elph-Mcnc: 1 g/dL (ref 0.7–1.3)
Gamma Glob SerPl Elph-Mcnc: 0.5 g/dL (ref 0.4–1.8)
Globulin, Total: 2.7 g/dL (ref 2.2–3.9)
IgA: 54 mg/dL — ABNORMAL LOW (ref 61–437)
IgG (Immunoglobin G), Serum: 550 mg/dL — ABNORMAL LOW (ref 603–1613)
IgM (Immunoglobulin M), Srm: 20 mg/dL (ref 20–172)
M Protein SerPl Elph-Mcnc: 0.2 g/dL — ABNORMAL HIGH
Total Protein ELP: 5.8 g/dL — ABNORMAL LOW (ref 6.0–8.5)

## 2024-01-21 LAB — KAPPA/LAMBDA LIGHT CHAINS
Kappa free light chain: 9.3 mg/L (ref 3.3–19.4)
Kappa, lambda light chain ratio: 1.5 (ref 0.26–1.65)
Lambda free light chains: 6.2 mg/L (ref 5.7–26.3)

## 2024-01-21 NOTE — Telephone Encounter (Signed)
 Scheduled appointments per 6/27 los. Talked with the patients spouse and she is aware of the made appointments for the patient.

## 2024-01-24 ENCOUNTER — Inpatient Hospital Stay: Payer: Medicare (Managed Care) | Attending: Physician Assistant

## 2024-01-24 ENCOUNTER — Inpatient Hospital Stay: Payer: Medicare (Managed Care)

## 2024-01-24 VITALS — BP 158/90 | HR 72 | Temp 98.0°F | Resp 18

## 2024-01-24 DIAGNOSIS — Z5111 Encounter for antineoplastic chemotherapy: Secondary | ICD-10-CM | POA: Diagnosis present

## 2024-01-24 DIAGNOSIS — C9002 Multiple myeloma in relapse: Secondary | ICD-10-CM | POA: Diagnosis not present

## 2024-01-24 DIAGNOSIS — D649 Anemia, unspecified: Secondary | ICD-10-CM | POA: Diagnosis not present

## 2024-01-24 LAB — CBC WITH DIFFERENTIAL (CANCER CENTER ONLY)
Abs Immature Granulocytes: 0.06 10*3/uL (ref 0.00–0.07)
Basophils Absolute: 0 10*3/uL (ref 0.0–0.1)
Basophils Relative: 0 %
Eosinophils Absolute: 0.3 10*3/uL (ref 0.0–0.5)
Eosinophils Relative: 4 %
HCT: 31.5 % — ABNORMAL LOW (ref 39.0–52.0)
Hemoglobin: 10.9 g/dL — ABNORMAL LOW (ref 13.0–17.0)
Immature Granulocytes: 1 %
Lymphocytes Relative: 9 %
Lymphs Abs: 0.9 10*3/uL (ref 0.7–4.0)
MCH: 28.1 pg (ref 26.0–34.0)
MCHC: 34.6 g/dL (ref 30.0–36.0)
MCV: 81.2 fL (ref 80.0–100.0)
Monocytes Absolute: 0.9 10*3/uL (ref 0.1–1.0)
Monocytes Relative: 9 %
Neutro Abs: 7.2 10*3/uL (ref 1.7–7.7)
Neutrophils Relative %: 77 %
Platelet Count: 175 10*3/uL (ref 150–400)
RBC: 3.88 MIL/uL — ABNORMAL LOW (ref 4.22–5.81)
RDW: 18.4 % — ABNORMAL HIGH (ref 11.5–15.5)
WBC Count: 9.4 10*3/uL (ref 4.0–10.5)
nRBC: 0.4 % — ABNORMAL HIGH (ref 0.0–0.2)

## 2024-01-24 LAB — CMP (CANCER CENTER ONLY)
ALT: 18 U/L (ref 0–44)
AST: 13 U/L — ABNORMAL LOW (ref 15–41)
Albumin: 3.5 g/dL (ref 3.5–5.0)
Alkaline Phosphatase: 86 U/L (ref 38–126)
Anion gap: 6 (ref 5–15)
BUN: 12 mg/dL (ref 8–23)
CO2: 26 mmol/L (ref 22–32)
Calcium: 9.4 mg/dL (ref 8.9–10.3)
Chloride: 112 mmol/L — ABNORMAL HIGH (ref 98–111)
Creatinine: 0.72 mg/dL (ref 0.61–1.24)
GFR, Estimated: >60 mL/min (ref >60)
Glucose, Bld: 104 mg/dL — ABNORMAL HIGH (ref 70–99)
Potassium: 3.7 mmol/L (ref 3.5–5.1)
Sodium: 144 mmol/L (ref 135–145)
Total Bilirubin: 0.7 mg/dL (ref 0.0–1.2)
Total Protein: 5.9 g/dL — ABNORMAL LOW (ref 6.5–8.1)

## 2024-01-24 MED ORDER — DEXTROSE 5 % IV SOLN
70.0000 mg/m2 | Freq: Once | INTRAVENOUS | Status: AC
  Administered 2024-01-24: 150 mg via INTRAVENOUS
  Filled 2024-01-24: qty 60

## 2024-01-24 MED ORDER — SODIUM CHLORIDE 0.9 % IV SOLN: Freq: Once | INTRAVENOUS | Status: AC

## 2024-01-24 MED ORDER — SODIUM CHLORIDE 0.9 % IV SOLN
40.0000 mg | Freq: Once | INTRAVENOUS | Status: AC
  Administered 2024-01-24: 40 mg via INTRAVENOUS
  Filled 2024-01-24: qty 4

## 2024-01-24 NOTE — Patient Instructions (Signed)
 [facility] - A DEPT OF [facility]  Discharge Instructions: Thank you for choosing [facility] to provide your oncology and hematology care.   If you have a lab appointment with the Cancer Center, please go directly to the Cancer Center and check in at the registration area.   Wear comfortable clothing and clothing appropriate for easy access to any Portacath or PICC line.   We strive to give you quality time with your provider. You may need to reschedule your appointment if you arrive late (15 or more minutes).  Arriving late affects you and other patients whose appointments are after yours.  Also, if you miss three or more appointments without notifying the office, you may be dismissed from the clinic at the provider's discretion.      For prescription refill requests, have your pharmacy contact our office and allow 72 hours for refills to be completed.    Today you received the following chemotherapy and/or immunotherapy agents Kyprolis      To help prevent nausea and vomiting after your treatment, we encourage you to take your nausea medication as directed.  BELOW ARE SYMPTOMS THAT SHOULD BE REPORTED IMMEDIATELY: *FEVER GREATER THAN 100.4 F (38 C) OR HIGHER *CHILLS OR SWEATING *NAUSEA AND VOMITING THAT IS NOT CONTROLLED WITH YOUR NAUSEA MEDICATION *UNUSUAL SHORTNESS OF BREATH *UNUSUAL BRUISING OR BLEEDING *URINARY PROBLEMS (pain or burning when urinating, or frequent urination) *BOWEL PROBLEMS (unusual diarrhea, constipation, pain near the anus) TENDERNESS IN MOUTH AND THROAT WITH OR WITHOUT PRESENCE OF ULCERS (sore throat, sores in mouth, or a toothache) UNUSUAL RASH, SWELLING OR PAIN  UNUSUAL VAGINAL DISCHARGE OR ITCHING   Items with * indicate a potential emergency and should be followed up as soon as possible or go to the Emergency Department if any problems should occur.  Please show the CHEMOTHERAPY ALERT CARD or IMMUNOTHERAPY  ALERT CARD at check-in to the Emergency Department and triage nurse.  Should you have questions after your visit or need to cancel or reschedule your appointment, please contact [facility] - A DEPT OF [facility]  Dept: [phone]  and follow the prompts.  Office hours are 8:00 a.m. to 4:30 p.m. Monday - Friday. Please note that voicemails left after 4:00 p.m. may not be returned until the following business day.  We are closed weekends and major holidays. You have access to a nurse at all times for urgent questions. Please call the main number to the clinic Dept: [phone] and follow the prompts.   For any non-urgent questions, you may also contact your provider using MyChart. We now offer e-Visits for anyone 49 and older to request care online for non-urgent symptoms. For details visit [email or web address].   Also download the MyChart app! Go to the app store, search "MyChart", open the app, select [facility] [place], and log in with your MyChart username and password.

## 2024-02-01 ENCOUNTER — Other Ambulatory Visit: Payer: Medicare (Managed Care)

## 2024-02-01 ENCOUNTER — Ambulatory Visit: Payer: Medicare (Managed Care) | Admitting: Nurse Practitioner

## 2024-02-01 ENCOUNTER — Ambulatory Visit: Payer: Medicare (Managed Care)

## 2024-02-01 ENCOUNTER — Encounter: Payer: Self-pay | Admitting: Hematology and Oncology

## 2024-02-01 ENCOUNTER — Other Ambulatory Visit: Payer: Self-pay

## 2024-02-08 ENCOUNTER — Inpatient Hospital Stay: Payer: Medicare (Managed Care)

## 2024-02-08 ENCOUNTER — Inpatient Hospital Stay (HOSPITAL_BASED_OUTPATIENT_CLINIC_OR_DEPARTMENT_OTHER): Payer: Medicare (Managed Care) | Admitting: Hematology and Oncology

## 2024-02-08 VITALS — BP 149/76 | HR 74 | Temp 97.5°F | Resp 13 | Wt 245.8 lb

## 2024-02-08 DIAGNOSIS — Z95828 Presence of other vascular implants and grafts: Secondary | ICD-10-CM | POA: Diagnosis not present

## 2024-02-08 DIAGNOSIS — C9002 Multiple myeloma in relapse: Secondary | ICD-10-CM

## 2024-02-08 DIAGNOSIS — G8220 Paraplegia, unspecified: Secondary | ICD-10-CM

## 2024-02-08 DIAGNOSIS — Z5111 Encounter for antineoplastic chemotherapy: Secondary | ICD-10-CM | POA: Diagnosis not present

## 2024-02-08 DIAGNOSIS — Z7901 Long term (current) use of anticoagulants: Secondary | ICD-10-CM

## 2024-02-08 LAB — CBC WITH DIFFERENTIAL (CANCER CENTER ONLY)
Abs Immature Granulocytes: 0.05 K/uL (ref 0.00–0.07)
Basophils Absolute: 0.1 K/uL (ref 0.0–0.1)
Basophils Relative: 1 %
Eosinophils Absolute: 0.4 K/uL (ref 0.0–0.5)
Eosinophils Relative: 6 %
HCT: 31.3 % — ABNORMAL LOW (ref 39.0–52.0)
Hemoglobin: 10.7 g/dL — ABNORMAL LOW (ref 13.0–17.0)
Immature Granulocytes: 1 %
Lymphocytes Relative: 11 %
Lymphs Abs: 0.8 K/uL (ref 0.7–4.0)
MCH: 27.8 pg (ref 26.0–34.0)
MCHC: 34.2 g/dL (ref 30.0–36.0)
MCV: 81.3 fL (ref 80.0–100.0)
Monocytes Absolute: 0.6 K/uL (ref 0.1–1.0)
Monocytes Relative: 9 %
Neutro Abs: 4.8 K/uL (ref 1.7–7.7)
Neutrophils Relative %: 72 %
Platelet Count: 375 K/uL (ref 150–400)
RBC: 3.85 MIL/uL — ABNORMAL LOW (ref 4.22–5.81)
RDW: 18.3 % — ABNORMAL HIGH (ref 11.5–15.5)
WBC Count: 6.6 K/uL (ref 4.0–10.5)
nRBC: 0 % (ref 0.0–0.2)

## 2024-02-08 LAB — CMP (CANCER CENTER ONLY)
ALT: 16 U/L (ref 0–44)
AST: 14 U/L — ABNORMAL LOW (ref 15–41)
Albumin: 3.6 g/dL (ref 3.5–5.0)
Alkaline Phosphatase: 90 U/L (ref 38–126)
Anion gap: 6 (ref 5–15)
BUN: 9 mg/dL (ref 8–23)
CO2: 25 mmol/L (ref 22–32)
Calcium: 9.1 mg/dL (ref 8.9–10.3)
Chloride: 114 mmol/L — ABNORMAL HIGH (ref 98–111)
Creatinine: 0.75 mg/dL (ref 0.61–1.24)
GFR, Estimated: >60 mL/min (ref >60)
Glucose, Bld: 102 mg/dL — ABNORMAL HIGH (ref 70–99)
Potassium: 3.4 mmol/L — ABNORMAL LOW (ref 3.5–5.1)
Sodium: 145 mmol/L (ref 135–145)
Total Bilirubin: 0.7 mg/dL (ref 0.0–1.2)
Total Protein: 6.2 g/dL — ABNORMAL LOW (ref 6.5–8.1)

## 2024-02-08 MED ORDER — SODIUM CHLORIDE 0.9 % IV SOLN
40.0000 mg | Freq: Once | INTRAVENOUS | Status: AC
  Administered 2024-02-08: 40 mg via INTRAVENOUS
  Filled 2024-02-08: qty 4

## 2024-02-08 MED ORDER — DEXTROSE 5 % IV SOLN
70.0000 mg/m2 | Freq: Once | INTRAVENOUS | Status: AC
  Administered 2024-02-08: 150 mg via INTRAVENOUS
  Filled 2024-02-08: qty 60

## 2024-02-08 MED ORDER — SODIUM CHLORIDE 0.9 % IV SOLN: Freq: Once | INTRAVENOUS | Status: AC

## 2024-02-08 MED ORDER — SODIUM CHLORIDE 0.9 % IV SOLN
Freq: Once | INTRAVENOUS | Status: AC
Start: 2024-02-08 — End: 2024-02-08

## 2024-02-08 NOTE — Patient Instructions (Signed)
 [facility] - A DEPT OF [facility]  Discharge Instructions: Thank you for choosing [facility] to provide your oncology and hematology care.   If you have a lab appointment with the Cancer Center, please go directly to the Cancer Center and check in at the registration area.   Wear comfortable clothing and clothing appropriate for easy access to any Portacath or PICC line.   We strive to give you quality time with your provider. You may need to reschedule your appointment if you arrive late (15 or more minutes).  Arriving late affects you and other patients whose appointments are after yours.  Also, if you miss three or more appointments without notifying the office, you may be dismissed from the clinic at the provider's discretion.      For prescription refill requests, have your pharmacy contact our office and allow 72 hours for refills to be completed.    Today you received the following chemotherapy and/or immunotherapy agents Kyprolis      To help prevent nausea and vomiting after your treatment, we encourage you to take your nausea medication as directed.  BELOW ARE SYMPTOMS THAT SHOULD BE REPORTED IMMEDIATELY: *FEVER GREATER THAN 100.4 F (38 C) OR HIGHER *CHILLS OR SWEATING *NAUSEA AND VOMITING THAT IS NOT CONTROLLED WITH YOUR NAUSEA MEDICATION *UNUSUAL SHORTNESS OF BREATH *UNUSUAL BRUISING OR BLEEDING *URINARY PROBLEMS (pain or burning when urinating, or frequent urination) *BOWEL PROBLEMS (unusual diarrhea, constipation, pain near the anus) TENDERNESS IN MOUTH AND THROAT WITH OR WITHOUT PRESENCE OF ULCERS (sore throat, sores in mouth, or a toothache) UNUSUAL RASH, SWELLING OR PAIN  UNUSUAL VAGINAL DISCHARGE OR ITCHING   Items with * indicate a potential emergency and should be followed up as soon as possible or go to the Emergency Department if any problems should occur.  Please show the CHEMOTHERAPY ALERT CARD or IMMUNOTHERAPY  ALERT CARD at check-in to the Emergency Department and triage nurse.  Should you have questions after your visit or need to cancel or reschedule your appointment, please contact [facility] - A DEPT OF [facility]  Dept: [phone]  and follow the prompts.  Office hours are 8:00 a.m. to 4:30 p.m. Monday - Friday. Please note that voicemails left after 4:00 p.m. may not be returned until the following business day.  We are closed weekends and major holidays. You have access to a nurse at all times for urgent questions. Please call the main number to the clinic Dept: [phone] and follow the prompts.   For any non-urgent questions, you may also contact your provider using MyChart. We now offer e-Visits for anyone 49 and older to request care online for non-urgent symptoms. For details visit [email or web address].   Also download the MyChart app! Go to the app store, search "MyChart", open the app, select [facility] [place], and log in with your MyChart username and password.
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
## 2024-02-09 ENCOUNTER — Other Ambulatory Visit: Payer: Self-pay

## 2024-02-11 LAB — MULTIPLE MYELOMA PANEL, SERUM
Albumin SerPl Elph-Mcnc: 3.2 g/dL (ref 2.9–4.4)
Albumin/Glob SerPl: 1.3 (ref 0.7–1.7)
Alpha 1: 0.3 g/dL (ref 0.0–0.4)
Alpha2 Glob SerPl Elph-Mcnc: 0.9 g/dL (ref 0.4–1.0)
B-Globulin SerPl Elph-Mcnc: 0.9 g/dL (ref 0.7–1.3)
Gamma Glob SerPl Elph-Mcnc: 0.5 g/dL (ref 0.4–1.8)
Globulin, Total: 2.5 g/dL (ref 2.2–3.9)
IgA: 44 mg/dL — ABNORMAL LOW (ref 61–437)
IgG (Immunoglobin G), Serum: 495 mg/dL — ABNORMAL LOW (ref 603–1613)
IgM (Immunoglobulin M), Srm: 16 mg/dL — ABNORMAL LOW (ref 20–172)
M Protein SerPl Elph-Mcnc: 0.1 g/dL — ABNORMAL HIGH
Total Protein ELP: 5.7 g/dL — ABNORMAL LOW (ref 6.0–8.5)

## 2024-02-11 LAB — KAPPA/LAMBDA LIGHT CHAINS
Kappa free light chain: 8.8 mg/L (ref 3.3–19.4)
Kappa, lambda light chain ratio: 1.91 — ABNORMAL HIGH (ref 0.26–1.65)
Lambda free light chains: 4.6 mg/L — ABNORMAL LOW (ref 5.7–26.3)

## 2024-02-15 ENCOUNTER — Inpatient Hospital Stay: Payer: Medicare (Managed Care)

## 2024-02-15 ENCOUNTER — Inpatient Hospital Stay: Payer: Medicare (Managed Care) | Admitting: Hematology and Oncology

## 2024-02-15 VITALS — BP 140/83 | HR 77 | Temp 98.4°F | Resp 18

## 2024-02-15 DIAGNOSIS — Z5111 Encounter for antineoplastic chemotherapy: Secondary | ICD-10-CM | POA: Diagnosis not present

## 2024-02-15 DIAGNOSIS — C9002 Multiple myeloma in relapse: Secondary | ICD-10-CM

## 2024-02-15 LAB — CBC WITH DIFFERENTIAL (CANCER CENTER ONLY)
Abs Immature Granulocytes: 0.04 K/uL (ref 0.00–0.07)
Basophils Absolute: 0 K/uL (ref 0.0–0.1)
Basophils Relative: 0 %
Eosinophils Absolute: 0.3 K/uL (ref 0.0–0.5)
Eosinophils Relative: 4 %
HCT: 31.6 % — ABNORMAL LOW (ref 39.0–52.0)
Hemoglobin: 10.7 g/dL — ABNORMAL LOW (ref 13.0–17.0)
Immature Granulocytes: 1 %
Lymphocytes Relative: 9 %
Lymphs Abs: 0.7 K/uL (ref 0.7–4.0)
MCH: 27.8 pg (ref 26.0–34.0)
MCHC: 33.9 g/dL (ref 30.0–36.0)
MCV: 82.1 fL (ref 80.0–100.0)
Monocytes Absolute: 0.8 K/uL (ref 0.1–1.0)
Monocytes Relative: 10 %
Neutro Abs: 5.4 K/uL (ref 1.7–7.7)
Neutrophils Relative %: 76 %
Platelet Count: 162 K/uL (ref 150–400)
RBC: 3.85 MIL/uL — ABNORMAL LOW (ref 4.22–5.81)
RDW: 18.5 % — ABNORMAL HIGH (ref 11.5–15.5)
WBC Count: 7.2 K/uL (ref 4.0–10.5)
nRBC: 0 % (ref 0.0–0.2)

## 2024-02-15 LAB — CMP (CANCER CENTER ONLY)
ALT: 15 U/L (ref 0–44)
AST: 12 U/L — ABNORMAL LOW (ref 15–41)
Albumin: 3.5 g/dL (ref 3.5–5.0)
Alkaline Phosphatase: 92 U/L (ref 38–126)
Anion gap: 5 (ref 5–15)
BUN: 18 mg/dL (ref 8–23)
CO2: 29 mmol/L (ref 22–32)
Calcium: 9.5 mg/dL (ref 8.9–10.3)
Chloride: 110 mmol/L (ref 98–111)
Creatinine: 0.8 mg/dL (ref 0.61–1.24)
GFR, Estimated: >60 mL/min (ref >60)
Glucose, Bld: 69 mg/dL — ABNORMAL LOW (ref 70–99)
Potassium: 3.6 mmol/L (ref 3.5–5.1)
Sodium: 144 mmol/L (ref 135–145)
Total Bilirubin: 0.7 mg/dL (ref 0.0–1.2)
Total Protein: 6 g/dL — ABNORMAL LOW (ref 6.5–8.1)

## 2024-02-15 MED ORDER — SODIUM CHLORIDE 0.9 % IV SOLN
40.0000 mg | Freq: Once | INTRAVENOUS | Status: AC
  Administered 2024-02-15: 40 mg via INTRAVENOUS
  Filled 2024-02-15: qty 4

## 2024-02-15 MED ORDER — DEXTROSE 5 % IV SOLN
70.0000 mg/m2 | Freq: Once | INTRAVENOUS | Status: AC
  Administered 2024-02-15: 150 mg via INTRAVENOUS
  Filled 2024-02-15: qty 60

## 2024-02-15 MED ORDER — SODIUM CHLORIDE 0.9 % IV SOLN: Freq: Once | INTRAVENOUS | Status: AC

## 2024-02-15 NOTE — Patient Instructions (Signed)
 [facility] - A DEPT OF [facility]  Discharge Instructions: Thank you for choosing [facility] to provide your oncology and hematology care.   If you have a lab appointment with the Cancer Center, please go directly to the Cancer Center and check in at the registration area.   Wear comfortable clothing and clothing appropriate for easy access to any Portacath or PICC line.   We strive to give you quality time with your provider. You may need to reschedule your appointment if you arrive late (15 or more minutes).  Arriving late affects you and other patients whose appointments are after yours.  Also, if you miss three or more appointments without notifying the office, you may be dismissed from the clinic at the provider's discretion.      For prescription refill requests, have your pharmacy contact our office and allow 72 hours for refills to be completed.    Today you received the following chemotherapy and/or immunotherapy agents Kyprolis      To help prevent nausea and vomiting after your treatment, we encourage you to take your nausea medication as directed.  BELOW ARE SYMPTOMS THAT SHOULD BE REPORTED IMMEDIATELY: *FEVER GREATER THAN 100.4 F (38 C) OR HIGHER *CHILLS OR SWEATING *NAUSEA AND VOMITING THAT IS NOT CONTROLLED WITH YOUR NAUSEA MEDICATION *UNUSUAL SHORTNESS OF BREATH *UNUSUAL BRUISING OR BLEEDING *URINARY PROBLEMS (pain or burning when urinating, or frequent urination) *BOWEL PROBLEMS (unusual diarrhea, constipation, pain near the anus) TENDERNESS IN MOUTH AND THROAT WITH OR WITHOUT PRESENCE OF ULCERS (sore throat, sores in mouth, or a toothache) UNUSUAL RASH, SWELLING OR PAIN  UNUSUAL VAGINAL DISCHARGE OR ITCHING   Items with * indicate a potential emergency and should be followed up as soon as possible or go to the Emergency Department if any problems should occur.  Please show the CHEMOTHERAPY ALERT CARD or IMMUNOTHERAPY  ALERT CARD at check-in to the Emergency Department and triage nurse.  Should you have questions after your visit or need to cancel or reschedule your appointment, please contact [facility] - A DEPT OF [facility]  Dept: [phone]  and follow the prompts.  Office hours are 8:00 a.m. to 4:30 p.m. Monday - Friday. Please note that voicemails left after 4:00 p.m. may not be returned until the following business day.  We are closed weekends and major holidays. You have access to a nurse at all times for urgent questions. Please call the main number to the clinic Dept: [phone] and follow the prompts.   For any non-urgent questions, you may also contact your provider using MyChart. We now offer e-Visits for anyone 49 and older to request care online for non-urgent symptoms. For details visit [email or web address].   Also download the MyChart app! Go to the app store, search "MyChart", open the app, select [facility] [place], and log in with your MyChart username and password.

## 2024-02-22 ENCOUNTER — Inpatient Hospital Stay: Payer: Medicare (Managed Care) | Admitting: Hematology and Oncology

## 2024-02-22 ENCOUNTER — Inpatient Hospital Stay: Payer: Medicare (Managed Care) | Attending: Physician Assistant

## 2024-02-22 ENCOUNTER — Inpatient Hospital Stay: Payer: Medicare (Managed Care)

## 2024-02-22 VITALS — BP 152/73 | HR 76 | Temp 97.3°F | Resp 14 | Wt 249.7 lb

## 2024-02-22 DIAGNOSIS — Z7901 Long term (current) use of anticoagulants: Secondary | ICD-10-CM | POA: Diagnosis not present

## 2024-02-22 DIAGNOSIS — Z95828 Presence of other vascular implants and grafts: Secondary | ICD-10-CM

## 2024-02-22 DIAGNOSIS — C9002 Multiple myeloma in relapse: Secondary | ICD-10-CM

## 2024-02-22 DIAGNOSIS — Z8619 Personal history of other infectious and parasitic diseases: Secondary | ICD-10-CM | POA: Diagnosis not present

## 2024-02-22 DIAGNOSIS — D649 Anemia, unspecified: Secondary | ICD-10-CM | POA: Insufficient documentation

## 2024-02-22 DIAGNOSIS — R413 Other amnesia: Secondary | ICD-10-CM | POA: Insufficient documentation

## 2024-02-22 DIAGNOSIS — Z5111 Encounter for antineoplastic chemotherapy: Secondary | ICD-10-CM | POA: Diagnosis present

## 2024-02-22 DIAGNOSIS — G4733 Obstructive sleep apnea (adult) (pediatric): Secondary | ICD-10-CM | POA: Insufficient documentation

## 2024-02-22 DIAGNOSIS — Z86718 Personal history of other venous thrombosis and embolism: Secondary | ICD-10-CM | POA: Diagnosis not present

## 2024-02-22 LAB — CMP (CANCER CENTER ONLY)
ALT: 16 U/L (ref 0–44)
AST: 11 U/L — ABNORMAL LOW (ref 15–41)
Albumin: 3.4 g/dL — ABNORMAL LOW (ref 3.5–5.0)
Alkaline Phosphatase: 88 U/L (ref 38–126)
Anion gap: 3 — ABNORMAL LOW (ref 5–15)
BUN: 10 mg/dL (ref 8–23)
CO2: 31 mmol/L (ref 22–32)
Calcium: 9.1 mg/dL (ref 8.9–10.3)
Chloride: 108 mmol/L (ref 98–111)
Creatinine: 0.77 mg/dL (ref 0.61–1.24)
GFR, Estimated: >60 mL/min (ref >60)
Glucose, Bld: 143 mg/dL — ABNORMAL HIGH (ref 70–99)
Potassium: 3.5 mmol/L (ref 3.5–5.1)
Sodium: 142 mmol/L (ref 135–145)
Total Bilirubin: 0.6 mg/dL (ref 0.0–1.2)
Total Protein: 5.8 g/dL — ABNORMAL LOW (ref 6.5–8.1)

## 2024-02-22 LAB — CBC WITH DIFFERENTIAL (CANCER CENTER ONLY)
Abs Immature Granulocytes: 0.04 K/uL (ref 0.00–0.07)
Basophils Absolute: 0 K/uL (ref 0.0–0.1)
Basophils Relative: 1 %
Eosinophils Absolute: 0.3 K/uL (ref 0.0–0.5)
Eosinophils Relative: 4 %
HCT: 31.4 % — ABNORMAL LOW (ref 39.0–52.0)
Hemoglobin: 10.4 g/dL — ABNORMAL LOW (ref 13.0–17.0)
Immature Granulocytes: 1 %
Lymphocytes Relative: 10 %
Lymphs Abs: 0.8 K/uL (ref 0.7–4.0)
MCH: 27.1 pg (ref 26.0–34.0)
MCHC: 33.1 g/dL (ref 30.0–36.0)
MCV: 81.8 fL (ref 80.0–100.0)
Monocytes Absolute: 0.8 K/uL (ref 0.1–1.0)
Monocytes Relative: 10 %
Neutro Abs: 6 K/uL (ref 1.7–7.7)
Neutrophils Relative %: 74 %
Platelet Count: 163 K/uL (ref 150–400)
RBC: 3.84 MIL/uL — ABNORMAL LOW (ref 4.22–5.81)
RDW: 18.1 % — ABNORMAL HIGH (ref 11.5–15.5)
WBC Count: 8 K/uL (ref 4.0–10.5)
nRBC: 0 % (ref 0.0–0.2)

## 2024-02-22 MED ORDER — DEXTROSE 5 % IV SOLN
70.0000 mg/m2 | Freq: Once | INTRAVENOUS | Status: AC
  Administered 2024-02-22: 150 mg via INTRAVENOUS
  Filled 2024-02-22: qty 60

## 2024-02-22 MED ORDER — SODIUM CHLORIDE 0.9 % IV SOLN
Freq: Once | INTRAVENOUS | Status: AC
Start: 2024-02-22 — End: 2024-02-22

## 2024-02-22 MED ORDER — SODIUM CHLORIDE 0.9 % IV SOLN
40.0000 mg | Freq: Once | INTRAVENOUS | Status: AC
  Administered 2024-02-22: 40 mg via INTRAVENOUS
  Filled 2024-02-22: qty 4

## 2024-02-22 MED ORDER — HEPARIN SOD (PORK) LOCK FLUSH 100 UNIT/ML IV SOLN
500.0000 [IU] | Freq: Once | INTRAVENOUS | Status: DC | PRN
Start: 2024-02-22 — End: 2024-02-22

## 2024-02-22 MED ORDER — SODIUM CHLORIDE 0.9% FLUSH: 10.0000 mL | INTRAVENOUS | Status: DC | PRN

## 2024-02-22 NOTE — Patient Instructions (Signed)
 [facility] - A DEPT OF [facility]  Discharge Instructions: Thank you for choosing [facility] to provide your oncology and hematology care.   If you have a lab appointment with the Cancer Center, please go directly to the Cancer Center and check in at the registration area.   Wear comfortable clothing and clothing appropriate for easy access to any Portacath or PICC line.   We strive to give you quality time with your provider. You may need to reschedule your appointment if you arrive late (15 or more minutes).  Arriving late affects you and other patients whose appointments are after yours.  Also, if you miss three or more appointments without notifying the office, you may be dismissed from the clinic at the provider's discretion.      For prescription refill requests, have your pharmacy contact our office and allow 72 hours for refills to be completed.    Today you received the following chemotherapy and/or immunotherapy agents Kyprolis      To help prevent nausea and vomiting after your treatment, we encourage you to take your nausea medication as directed.  BELOW ARE SYMPTOMS THAT SHOULD BE REPORTED IMMEDIATELY: *FEVER GREATER THAN 100.4 F (38 C) OR HIGHER *CHILLS OR SWEATING *NAUSEA AND VOMITING THAT IS NOT CONTROLLED WITH YOUR NAUSEA MEDICATION *UNUSUAL SHORTNESS OF BREATH *UNUSUAL BRUISING OR BLEEDING *URINARY PROBLEMS (pain or burning when urinating, or frequent urination) *BOWEL PROBLEMS (unusual diarrhea, constipation, pain near the anus) TENDERNESS IN MOUTH AND THROAT WITH OR WITHOUT PRESENCE OF ULCERS (sore throat, sores in mouth, or a toothache) UNUSUAL RASH, SWELLING OR PAIN  UNUSUAL VAGINAL DISCHARGE OR ITCHING   Items with * indicate a potential emergency and should be followed up as soon as possible or go to the Emergency Department if any problems should occur.  Please show the CHEMOTHERAPY ALERT CARD or IMMUNOTHERAPY  ALERT CARD at check-in to the Emergency Department and triage nurse.  Should you have questions after your visit or need to cancel or reschedule your appointment, please contact [facility] - A DEPT OF [facility]  Dept: [phone]  and follow the prompts.  Office hours are 8:00 a.m. to 4:30 p.m. Monday - Friday. Please note that voicemails left after 4:00 p.m. may not be returned until the following business day.  We are closed weekends and major holidays. You have access to a nurse at all times for urgent questions. Please call the main number to the clinic Dept: [phone] and follow the prompts.   For any non-urgent questions, you may also contact your provider using MyChart. We now offer e-Visits for anyone 49 and older to request care online for non-urgent symptoms. For details visit [email or web address].   Also download the MyChart app! Go to the app store, search "MyChart", open the app, select [facility] [place], and log in with your MyChart username and password.

## 2024-02-22 NOTE — Progress Notes (Signed)
 [facility] [phone]   [phone]  PROGRESS NOTE  Patient Care Team: Gary Annabella CROME, DO as PCP - General (Geriatric Medicine) Gary Arthea DASEN, MD as Consulting Physician (Physical Medicine and Rehabilitation) Gary Toribio PARAS, PA-C as Physician Assistant (Physical Medicine and Rehabilitation) Gary Norleen Howell MADISON, MD as Consulting Physician (Hematology and Oncology) Gary Norleen Howell MADISON, MD as Consulting Physician (Hematology and Oncology) Gary Norleen Howell MADISON, MD as Consulting Physician (Hematology and Oncology)  Hematological/Oncological History # IgG Lambda Multiple Myeloma, Relapsed (ISS Stage II) 1) 06/2010: initial diagnosis of Multiple Myeloma after T8 compression fracture. Treated with Velcade /Revlimid /Dexamethasone  and achieved a complete remission 2) Velcade  was discontinued in September 2012 and that Revlimid  and Decadron  were discontinued in March 2013. 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 4) Followed by Dr. Lonn, last clinic visit 10/09/2019. At that time there was concern for relapse of his multiple myeloma.  5) Patient requested transfer to different provider after misunderstanding regarding imaging studies 6) 12/17/2019: transfer care to Dr. Federico  7) 01/09/2020: Cycle 1 Day 1 of Dara/Velcade /Dex 8) 01/21/2020: presented as urgent visit for diarrhea and dehydration. Holding chemotherapy scheduled for 01/23/2020. 9) 01/30/2020: Resume dara/velcade /dex after resolution of diarrhea.  10) 02/13/2020: restaging labs show M protein 0.8, Kappa 4.5, lamba 17.2, ratio 0.26, urine M protein 53 (7.1%). All MM labs indicate improvement.  11) 03/10/2020: Cycle 4 Day 1 of Dara/Velcade /Dex. Transition to q 3 week daratumumab .  12) 06/04/2020:  Cycle 8 Day 1 of Dara/Velcade /Dex 13) 06/25/2020:  Cycle 9 Day 1 of Dara/Velcade /Dex 14) 07/22/2020: Cycle 10 Day  1 of  Dara//Dex 15) 08/18/2020: Cycle 11 Day 1 of Dara//Dex 16) 09/23/2020: Cycle 12 Day 1 of Dara//Dex 17) 10/22/2020: Cycle 13 Day 1 of Dara/Dex  18) 11/19/2020: Cycle 14 Day 1 of Dara/Dex  19) 12/17/2020: Cycle 15 Day 1 of Dara/Dex  20) 01/17/2021: Cycle 16 Day 1 of Dara/Dex  21) 02/11/2021: Cycle 17 Day 1 of Dara/Dex 22) 03/10/2021: Cycle 18 Day 1 of Dara/Dex  23) 04/08/2021: Cycle 19 Day 1 of Dara/Dex  24) 08/03/2021: Cycle 20 Day 1 of Dara/Dex (delayed due to scheduling error) 25) 09/02/2021: Cycle 21 Day 1 of Dara/Dex 26) 09/30/2021: Cycle 22 Day 1 of Dara/Dex 27) 10/28/2021: Cycle 23 Day 1 of Dara/Dex 28) 12/02/2021: Cycle 24 Day 1 of Dara/Dex 29) 12/30/2021: Cycle 25 Day 1 of Dara/Dex 30) 01/06/2022: Cycle 1 Day 1 of Kyprolis /Dex 31) 02/03/2022: Cycle 2 Day 1 of Kyprolis /Dex 32) 02/24/2022: Cycle 3 Day 1 of Kyprolis /Dex 33) 04/07/2022: Cycle 4 Day 1 of Kyprolis /Dex 34) 05/05/2022: Cycle 5 Day 1 of Kyprolis /Dex 35) 06/01/2022: Cycle 6 Day 1 of Kyprolis /Dex 36) 06/29/2022: Cycle 7 Day 1 of Kyprolis /Dex 37) 07/28/2022: Cycle 8 Day 1 of Kyprolis /Dex 38) 08/25/2022: Cycle 9 Day 1 of Kyprolis /Dex 39) 09/22/2022: Cycle 10 Day 1 of Kyprolis /Dex 40) 10/20/2022: Cycle 11 Day 1 of Kyprolis /Dex 41) 11/17/2022: Cycle 12 Day 1 of Kyprolis /Dex 42 ) 12/22/2022-01/08/2023: Admitted for fournier's gangrene of scrotum.  43) 01/26/2023: Cycle 13 Day 1 of Kyprolis /Dex 44) 02/16/2023: Cycle 14 Day 1 of Kyprolis /Dex 45) 03/16/2023:Cycle 15 Day 1 of Kyprolis /Dex 46) 04/13/2023: Cycle 16 Day 1 of Kyprolis /Dex 47) 05/11/2023: Cycle 17 Day 1 of Kyprolis /Dex 48) 06/07/2023: Cycle 18 Day 1 of Kyprolis /Dex 49) 07/06/2023: Cycle 19 Day 1 of Kyprolis /Dex 50) 08/02/2023: Cycle 20 Day 1 of Kyprolis /Dex 51) 08/31/2023: Cycle 21 Day 1 of  Kyprolis /Dex 52) 09/28/2023: Cycle 22 Day 1 of Kyprolis /Dex 53) 10/26/2023: Cycle 23 Day 1 of Kyprolis /Dex 54) 11/23/2023: Cycle 24 Day 1 of Kyprolis /Dex  Interval History:  [name] 68 y.o. male with  medical history significant for  IgG Lambda Multiple Myeloma who presents for a follow up visit. The patient's last visit was on 01/18/2024.  He presents today prior to treatment with Kyprolis /Dex. He is unaccompanied for this visit.   On exam today Mr. [name] reports he is doing his best to stay cool and hydrated.  He notes that his weight is up 4 pounds from our last visit but reports that he ate his sugar smacks cereal this morning.  He notes that he is tolerating his treatment well with no nausea vomiting or diarrhea.  He did have that bout of diarrhea prior to our last visit where he drank apple juice and coffee and reports he has been avoiding this combination.  He notes he is not having any numbness of his fingers.  Overall he reports his energy is so-so.  He notes he is not having any overt signs of bleeding anywhere such as blood in the urine, stool, or nosebleeds.  He has not had any viral illnesses.  Overall he is willing and able to proceed with treatment today.  MEDICAL HISTORY:  Past Medical History:  Diagnosis Date   Adrenal insufficiency (HCC)    on chronic dexamethasone    Anemia    Cancer (HCC)    Coagulopathy (HCC)    on xeralto/ s/p DVT while on coumadin,  IVC in place   Diabetes mellitus without complication (HCC)    type 2   Glaucoma 12/22/2022   Gross hematuria 01/2013   post foley cath procedure   History of blood transfusion 01/2013   Lower GI bleeding 04/18/2017   Multiple myeloma    thoracic T8 with paraplegia s/p resection- on chemo at visit 10/13/10   Multiple myeloma    Multiple myeloma without mention of remission    Neurogenic bladder    Neurogenic bowel    OSA (obstructive sleep apnea) 11/01/2022   Paraplegia (HCC)    Partial small bowel obstruction (HCC) during dec 2011 admission    SURGICAL HISTORY: Past Surgical History:  Procedure Laterality Date   COLONOSCOPY WITH PROPOFOL  N/A 04/12/2017   Procedure: COLONOSCOPY WITH PROPOFOL ;  Surgeon: Gary Norleen SAILOR, MD;  Location: [facility];  Service: Endoscopy;  Laterality: N/A;   COLONOSCOPY WITH PROPOFOL  N/A 04/19/2017   Procedure: COLONOSCOPY WITH PROPOFOL ;  Surgeon: Leigh Elspeth SQUIBB, MD;  Location: [facility];  Service: Gastroenterology;  Laterality: N/A;   COLOSTOMY  07/20/2011   Procedure: COLOSTOMY;  Surgeon: Redell Alm Faith, DO;  Location: [facility] OR;  Service: General;;   COLOSTOMY REVISION  07/20/2011   Procedure: COLON RESECTION SIGMOID;  Surgeon: Redell Alm Faith, DO;  Location: [facility] OR;  Service: General;;   CYSTOSCOPY N/A 04/04/2013   Procedure: CYSTOSCOPY WITH LITHALOPAXY;  Surgeon: Ricardo Likens, MD;  Location: [facility];  Service: Urology;  Laterality: N/A;   INCISION AND DRAINAGE ABSCESS N/A 12/25/2022   Procedure: INCISION AND DRAINAGE OF SCROTUM;  Surgeon: Lovie Arlyss CROME, MD;  Location: [facility];  Service: Urology;  Laterality: N/A;   INSERTION OF SUPRAPUBIC CATHETER N/A 04/04/2013   Procedure: INSERTION OF SUPRAPUBIC CATHETER;  Surgeon: Ricardo Likens, MD;  Location: [facility];  Service: Urology;  Laterality: N/A;   LAPAROTOMY  07/20/2011   Procedure: EXPLORATORY LAPAROTOMY;  Surgeon: Redell Alm Faith, DO;  Location: [facility] OR;  Service: General;  Laterality: N/A;   myeloma thoracic T8 with parpaplegia s/p thoracotomy and thoracic T7-9 cage placement on Dec 26th 2011  07/18/10   SCROTAL EXPLORATION N/A 12/29/2022   Procedure: SCROTAL WOUND DEBRIDEMENT AND CLOSURE;  Surgeon: Alvaro Ricardo KATHEE Mickey., MD;  Location: [facility];  Service: Urology;  Laterality: N/A;    SOCIAL HISTORY: Social History   Socioeconomic History   Marital status: Married    Spouse name: Not on file   Number of children: Not on file   Years of education: Not on file   Highest education level: Not on file  Occupational History   Not on file  Tobacco Use   Smoking status: Never   Smokeless tobacco: Never  Vaping Use   Vaping status: Never Used  Substance and Sexual Activity   Alcohol use: No   Drug use:  No   Sexual activity: Never  Other Topics Concern   Not on file  Social History Narrative   Not on file   Social Drivers of Health   Financial Resource Strain: Not on file  Food Insecurity: No Food Insecurity (12/22/2022)   Hunger Vital Sign    Worried About Running Out of Food in the Last Year: Never true    Ran Out of Food in the Last Year: Never true  Transportation Needs: No Transportation Needs (12/22/2022)   PRAPARE - Administrator, Civil Service (Medical): No    Lack of Transportation (Non-Medical): No  Physical Activity: Not on file  Stress: Not on file  Social Connections: Not on file  Intimate Partner Violence: Not At Risk (12/22/2022)   Humiliation, Afraid, Rape, and Kick questionnaire    Fear of Current or Ex-Partner: No    Emotionally Abused: No    Physically Abused: No    Sexually Abused: No    FAMILY HISTORY: Family History  Problem Relation Age of Onset   Ovarian cancer Mother    Diabetes Father     ALLERGIES:  is allergic to ferumoxytol .  MEDICATIONS:  Current Outpatient Medications  Medication Sig Dispense Refill   acetaminophen  (TYLENOL ) 325 MG tablet Take 325-650 mg by mouth every 6 (six) hours as needed for headache or mild pain.     acetic acid 0.25 % irrigation Irrigate with 1 Application as directed See admin instructions. Instill 50 ml's, clamp for 10 minutes, then remove clamp and drain the bladder. Repeat 3 times a week- when not using the Renacidin irrigation     acyclovir  (ZOVIRAX ) 400 MG tablet Take 1 tablet (400 mg total) by mouth 2 (two) times daily. 60 tablet 2   albuterol  (VENTOLIN  HFA) 108 (90 Base) MCG/ACT inhaler Inhale 2 puffs into the lungs every 4 (four) hours as needed for wheezing or shortness of breath.     atorvastatin  (LIPITOR) 20 MG tablet Take 20 mg by mouth daily.     BENADRYL  ALLERGY 25 MG tablet Take 25 mg by mouth every 6 (six) hours as needed (for allergic reactions).     BIOFREEZE 4 % GEL Apply 1 application   topically every 4 (four) hours as needed (for joint pain- to intact areas of the skin only).     Citric Ac-Gluconolact-Mg Carb (RENACIDIN IR) Irrigate with 30 mLs as directed See admin instructions. Instill 30 ml's, clamp for 10 minutes, then remove clamp and drain the bladder. Repeat 3 times a week- when not using the acetic acid irrigation     COSOPT  PF 2-0.5 %  SOLN Place 1 drop into both eyes 2 (two) times daily.     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     Emollient (EUCERIN EX) Apply 1 application  topically See admin instructions. Apply to affected area 2 times a day     FLOVENT HFA 110 MCG/ACT inhaler 2 each See admin instructions. 2 sprays to colostomy stoma prior to each colostomy bag change     guaifenesin  (ROBITUSSIN) 100 MG/5ML syrup Take 200 mg by mouth 4 (four) times daily as needed for cough.     iron  polysaccharides (NU-IRON ) 150 MG capsule Take 1 capsule (150 mg total) by mouth daily. 30 capsule 0   latanoprost  (XALATAN ) 0.005 % ophthalmic solution Place 1 drop into both eyes at bedtime.     Multiple Vitamin (MULTIVITAMIN WITH MINERALS) TABS tablet Take 1 tablet by mouth daily. 30 tablet 0   NON FORMULARY Apply 1 application  topically See admin instructions. Colo Plast paste- Apply as directed with each ostomy bag change     ondansetron  (ZOFRAN ) 4 MG tablet Take 1 tablet (4 mg) by mouth every 6 hours as needed for nausea. 20 tablet 0   rivaroxaban  (XARELTO ) 10 MG TABS tablet Take 1 tablet (10 mg total) by mouth daily with supper. (Patient taking differently: Take 10 mg by mouth daily.) 30 tablet 9   Skin Protectants, Misc. (MINERIN CREME) CREA Apply to affected areas 2 times daily as needed. 454 g 0   Zinc  Oxide (BALMEX EX) Apply 1 application  topically See admin instructions. Apply topically daily to affected sites     No current facility-administered medications for this visit.    Facility-Administered Medications Ordered in Other Visits  Medication Dose Route Frequency Provider Last Rate Last Admin   heparin  lock flush 100 unit/mL  500 Units Intracatheter Once PRN Timofey Carandang T IV, MD       sodium chloride  flush (NS) 0.9 % injection 10 mL  10 mL Intravenous PRN [name], Ni, MD       sodium chloride  flush (NS) 0.9 % injection 10 mL  10 mL Intracatheter PRN Gary Norleen ONEIDA MADISON, MD        REVIEW OF SYSTEMS:   Constitutional: ( - ) fevers, ( - )  chills , ( - ) night sweats Eyes: ( - ) blurriness of vision, ( - ) double vision, ( - ) watery eyes Ears, nose, mouth, throat, and face: ( - ) mucositis, ( - ) sore throat Respiratory: ( - ) cough, ( - ) dyspnea, ( - ) wheezes Cardiovascular: ( - ) palpitation, ( - ) chest discomfort, ( - ) lower extremity swelling Gastrointestinal:  ( - ) nausea, ( - ) heartburn, ( - ) change in bowel habits Skin: ( - ) abnormal skin rashes Lymphatics: ( - ) new lymphadenopathy, ( - ) easy bruising Neurological: ( - ) numbness, ( - ) tingling, ( - ) new weaknesses Behavioral/Psych: ( - ) mood change, ( - ) new changes  All other systems were reviewed with the patient and are negative.  PHYSICAL EXAMINATION: ECOG PERFORMANCE STATUS: paraplegic.   Vitals:   02/22/24 0846  BP: (!) 152/73  Pulse: 76  Resp: 14  Temp: (!) 97.3 F (36.3 C)  SpO2: 100%       Filed Weights   02/22/24 0846  Weight: 249 lb 11.2 oz (113.3 kg)  GENERAL: well appearing middle aged [place] American male alert, no distress and comfortable SKIN: skin color, texture, turgor are normal, no rashes or significant lesions EYES: conjunctiva are pink and non-injected, sclera clear LUNGS: clear to auscultation and percussion with normal breathing effort HEART: regular rate & rhythm and no murmurs Musculoskeletal: no cyanosis of digits and no clubbing  PSYCH: alert & oriented x 3, fluent speech NEURO: paraplegic, no use of LE bilaterally.    LABORATORY DATA:  I have reviewed the data as listed    Latest Ref Rng & Units 02/22/2024    8:24 AM 02/15/2024    9:28 AM 02/08/2024    8:24 AM  CBC  WBC 4.0 - 10.5 K/uL 8.0  7.2  6.6   Hemoglobin 13.0 - 17.0 g/dL 89.5  89.2  89.2   Hematocrit 39.0 - 52.0 % 31.4  31.6  31.3   Platelets 150 - 400 K/uL 163  162  375        Latest Ref Rng & Units 02/22/2024    8:24 AM 02/15/2024    9:28 AM 02/08/2024    8:24 AM  CMP  Glucose 70 - 99 mg/dL 856  69  897   BUN 8 - 23 mg/dL 10  18  9    Creatinine 0.61 - 1.24 mg/dL 9.22  9.19  9.24   Sodium 135 - 145 mmol/L 142  144  145   Potassium 3.5 - 5.1 mmol/L 3.5  3.6  3.4   Chloride 98 - 111 mmol/L 108  110  114   CO2 22 - 32 mmol/L 31  29  25    Calcium  8.9 - 10.3 mg/dL 9.1  9.5  9.1   Total Protein 6.5 - 8.1 g/dL 5.8  6.0  6.2   Total Bilirubin 0.0 - 1.2 mg/dL 0.6  0.7  0.7   Alkaline Phos 38 - 126 U/L 88  92  90   AST 15 - 41 U/L 11  12  14    ALT 0 - 44 U/L 16  15  16      Lab Results  Component Value Date   MPROTEIN 0.1 (H) 02/08/2024   MPROTEIN 0.2 (H) 01/18/2024   MPROTEIN Not Observed 12/21/2023   Lab Results  Component Value Date   KPAFRELGTCHN 8.8 02/08/2024   KPAFRELGTCHN 9.3 01/18/2024   KPAFRELGTCHN 7.4 12/21/2023   LAMBDASER 4.6 (L) 02/08/2024   LAMBDASER 6.2 01/18/2024   LAMBDASER 3.6 (L) 12/21/2023   KAPLAMBRATIO 1.91 (H) 02/08/2024   KAPLAMBRATIO 1.50 01/18/2024   KAPLAMBRATIO 2.06 (H) 12/21/2023    RADIOGRAPHIC STUDIES: No results found.     ASSESSMENT & PLAN [name] is a 68 y.o.  male with medical history significant for  IgG Lambda Multiple Myeloma who presents for a follow up visit.  # IgG Lambda Multiple Myeloma, Relapsed (ISS Stage II) --findings are most consistent with relapsed multiple myeloma. Patient previously successfully treated with Velcade /Rev/Dex and Daratumumab /Velcade /Dex. On 07/02/2020 he transitioned to monthly daratumumab  alone.  --due to to rise in M protein, switched to  Kyprolis  and Dexamethasone  on 01/06/2022 Plan: --Due for Cycle 27, Day 15 of Kyprolis /Dex today  --Labs show white blood cell count 8.0, Hgb 10.4, MCV 81.8, Plt 163. Creatinine and LFTs are in range.  --Most recent myeloma labs from 02/08/2024 showed M protein 0.1 with kappa 8.8, lambda light chain 4.6, ratio 1.91 --Proceed with treatment today without any dose modifications. --Continue with weekly labs plus Kyprolis /Dex treatment as scheduled  #Anemia: --Likely secondary  to MM +/- iron  deficiency --Currently on PO iron  therapy daily --Will recheck iron  panel periodically.   #Upper respiratory symptoms --No improvement with Z-pack --Suspect allergies, advised to take Zyrtec plus Flonase  # Memory loss: --Encouraged to do memory exercises including puzzles, suduko, etc --Likely secondary to chemotherapy  --If symptoms worsen, we can make a referral to neurology for further evaluation.   #History of Sepsis 2/2 UTI and Fournier's gangrene of scrotum: --Hospitalized from 12/22/2022-01/08/2023. Underwent debridement of necrotic tissue. --Given PIP tazobactam and Zyvox  initially --Urine culture reveals Proteus mirabilis which is resistant to ciprofloxacin  and nitrofurantoin --Blood cultures negative --D/C on remainder doxycycline  and Augmentin  which he completed on 01/07/2023.  --Evaluated by urology on 7/2 and ID on 7/3 with continued wound healing and no further d/c or evidence of infection.   #History of DVT --He had placement of IVC filter, remains on Xarelto . --Due to poor mobility, and lack of bleeding complications, recommend to continue on Xarelto  indefinitely. --caution if Plt count were to drop <50  #Snoring/Risk for OSA: --Diagnosed with OSA and has CPAP machine.   # Supportive Care -- provided patient with an albuterol  inhaler (for use with daratumumab ) --acyclovir  400mg  BID for VZV prophylaxis --zofran  8mg  q8H PRN for nausea/vomiting  --Zometa  is being held in the setting  of his prior episode of osteonecrosis of the jaw  No orders of the defined types were placed in this encounter.   All questions were answered. The patient knows to call the clinic with any problems, questions or concerns.  I have spent a total of 30 minutes minutes of face-to-face and non-face-to-face time, preparing to see the patient,  performing a medically appropriate examination, counseling and educating the patient, communicating with other health care professionals, documenting clinical information in the electronic health record,  and care coordination.   Norleen IVAR Kidney, MD Department of Hematology/Oncology [facility] Phone: [phone] Pager: [phone] Email: [email or web address]  02/22/2024 11:47 AM   Literature Support:  Bringhen S, Mina R, Petrucci MT, Gaidano G, Ballanti S, Musto P, Offidani M, Spada S, Benevolo G, Ponticelli E, Galieni P, Cavo M, Di Toritto TC, Di Raimondo F, Montefusco V, Palumbo A, Boccadoro M, Larocca A. Once-weekly versus twice-weekly carfilzomib  in patients with newly diagnosed multiple myeloma: a pooled analysis of two phase I/II studies. Haematologica. 2019 Aug;104(8):1640-1647.  --Once-weekly 70 mg/m2 carfilzomib  as induction and maintenance therapy for newly diagnosed multiple myeloma patients was as safe and effective as twice-weekly 36 mg/m2 carfilzomib  and provided a more convenient schedule.

## 2024-02-26 NOTE — Procedures (Signed)
 SABRA

## 2024-03-04 ENCOUNTER — Encounter: Payer: Self-pay | Admitting: Hematology and Oncology

## 2024-03-07 ENCOUNTER — Inpatient Hospital Stay (HOSPITAL_BASED_OUTPATIENT_CLINIC_OR_DEPARTMENT_OTHER): Payer: Medicare (Managed Care) | Admitting: Hematology and Oncology

## 2024-03-07 ENCOUNTER — Encounter: Payer: Self-pay | Admitting: Hematology and Oncology

## 2024-03-07 ENCOUNTER — Inpatient Hospital Stay: Payer: Medicare (Managed Care)

## 2024-03-07 VITALS — BP 130/88 | HR 72 | Temp 97.5°F | Resp 17

## 2024-03-07 DIAGNOSIS — Z86718 Personal history of other venous thrombosis and embolism: Secondary | ICD-10-CM

## 2024-03-07 DIAGNOSIS — G4733 Obstructive sleep apnea (adult) (pediatric): Secondary | ICD-10-CM

## 2024-03-07 DIAGNOSIS — Z8041 Family history of malignant neoplasm of ovary: Secondary | ICD-10-CM

## 2024-03-07 DIAGNOSIS — G8220 Paraplegia, unspecified: Secondary | ICD-10-CM

## 2024-03-07 DIAGNOSIS — C9002 Multiple myeloma in relapse: Secondary | ICD-10-CM

## 2024-03-07 DIAGNOSIS — Z8619 Personal history of other infectious and parasitic diseases: Secondary | ICD-10-CM

## 2024-03-07 DIAGNOSIS — Z5111 Encounter for antineoplastic chemotherapy: Secondary | ICD-10-CM

## 2024-03-07 DIAGNOSIS — Z79899 Other long term (current) drug therapy: Secondary | ICD-10-CM

## 2024-03-07 DIAGNOSIS — R413 Other amnesia: Secondary | ICD-10-CM | POA: Diagnosis not present

## 2024-03-07 DIAGNOSIS — D649 Anemia, unspecified: Secondary | ICD-10-CM | POA: Diagnosis not present

## 2024-03-07 DIAGNOSIS — Z95828 Presence of other vascular implants and grafts: Secondary | ICD-10-CM

## 2024-03-07 DIAGNOSIS — Z7901 Long term (current) use of anticoagulants: Secondary | ICD-10-CM

## 2024-03-07 LAB — CMP (CANCER CENTER ONLY)
ALT: 15 U/L (ref 0–44)
AST: 15 U/L (ref 15–41)
Albumin: 3.9 g/dL (ref 3.5–5.0)
Alkaline Phosphatase: 97 U/L (ref 38–126)
Anion gap: 8 (ref 5–15)
BUN: 8 mg/dL (ref 8–23)
CO2: 27 mmol/L (ref 22–32)
Calcium: 9.1 mg/dL (ref 8.9–10.3)
Chloride: 109 mmol/L (ref 98–111)
Creatinine: 0.66 mg/dL (ref 0.61–1.24)
GFR, Estimated: >60 mL/min (ref >60)
Glucose, Bld: 103 mg/dL — ABNORMAL HIGH (ref 70–99)
Potassium: 3.4 mmol/L — ABNORMAL LOW (ref 3.5–5.1)
Sodium: 144 mmol/L (ref 135–145)
Total Bilirubin: 0.6 mg/dL (ref 0.0–1.2)
Total Protein: 6.2 g/dL — ABNORMAL LOW (ref 6.5–8.1)

## 2024-03-07 LAB — CBC WITH DIFFERENTIAL (CANCER CENTER ONLY)
Abs Immature Granulocytes: 0.05 K/uL (ref 0.00–0.07)
Basophils Absolute: 0.1 K/uL (ref 0.0–0.1)
Basophils Relative: 1 %
Eosinophils Absolute: 0.3 K/uL (ref 0.0–0.5)
Eosinophils Relative: 4 %
HCT: 31.9 % — ABNORMAL LOW (ref 39.0–52.0)
Hemoglobin: 10.8 g/dL — ABNORMAL LOW (ref 13.0–17.0)
Immature Granulocytes: 1 %
Lymphocytes Relative: 12 %
Lymphs Abs: 0.9 K/uL (ref 0.7–4.0)
MCH: 27.8 pg (ref 26.0–34.0)
MCHC: 33.9 g/dL (ref 30.0–36.0)
MCV: 82 fL (ref 80.0–100.0)
Monocytes Absolute: 0.7 K/uL (ref 0.1–1.0)
Monocytes Relative: 9 %
Neutro Abs: 5.5 K/uL (ref 1.7–7.7)
Neutrophils Relative %: 73 %
Platelet Count: 371 K/uL (ref 150–400)
RBC: 3.89 MIL/uL — ABNORMAL LOW (ref 4.22–5.81)
RDW: 18.4 % — ABNORMAL HIGH (ref 11.5–15.5)
WBC Count: 7.6 K/uL (ref 4.0–10.5)
nRBC: 0 % (ref 0.0–0.2)

## 2024-03-07 MED ORDER — SODIUM CHLORIDE 0.9 % IV SOLN
40.0000 mg | Freq: Once | INTRAVENOUS | Status: AC
  Administered 2024-03-07: 40 mg via INTRAVENOUS
  Filled 2024-03-07: qty 4

## 2024-03-07 MED ORDER — SODIUM CHLORIDE 0.9 % IV SOLN
Freq: Once | INTRAVENOUS | Status: AC
Start: 2024-03-07 — End: 2024-03-07

## 2024-03-07 MED ORDER — DEXTROSE 5 % IV SOLN
70.0000 mg/m2 | Freq: Once | INTRAVENOUS | Status: AC
  Administered 2024-03-07: 150 mg via INTRAVENOUS
  Filled 2024-03-07: qty 60

## 2024-03-07 MED ORDER — SODIUM CHLORIDE 0.9 % IV SOLN: Freq: Once | INTRAVENOUS | Status: AC

## 2024-03-07 NOTE — Progress Notes (Signed)
 [name] [phone]   [phone]  PROGRESS NOTE  Patient Care Team: Cloria Annabella CROME, DO as PCP - General (Geriatric Medicine) Babs Arthea DASEN, MD as Consulting Physician (Physical Medicine and Rehabilitation) Pegge Toribio PARAS, PA-C as Physician Assistant (Physical Medicine and Rehabilitation) Federico Norleen DASEN MADISON, MD as Consulting Physician (Hematology and Oncology) Federico Norleen DASEN MADISON, MD as Consulting Physician (Hematology and Oncology) Federico Norleen DASEN MADISON, MD as Consulting Physician (Hematology and Oncology)  Hematological/Oncological History # IgG Lambda Multiple Myeloma, Relapsed (ISS Stage II) 1) 06/2010: initial diagnosis of Multiple Myeloma after T8 compression fracture. Treated with Velcade /Revlimid /Dexamethasone  and achieved a complete remission 2) Velcade  was discontinued in September 2012 and that Revlimid  and Decadron  were discontinued in March 2013. 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 4) Followed by Dr. Lonn, last clinic visit 10/09/2019. At that time there was concern for relapse of his multiple myeloma.  5) Patient requested transfer to different provider after misunderstanding regarding imaging studies 6) 12/17/2019: transfer care to Dr. Federico  7) 01/09/2020: Cycle 1 Day 1 of Dara/Velcade /Dex 8) 01/21/2020: presented as urgent visit for diarrhea and dehydration. Holding chemotherapy scheduled for 01/23/2020. 9) 01/30/2020: Resume dara/velcade /dex after resolution of diarrhea.  10) 02/13/2020: restaging labs show M protein 0.8, Kappa 4.5, lamba 17.2, ratio 0.26, urine M protein 53 (7.1%). All MM labs indicate improvement.  11) 03/10/2020: Cycle 4 Day 1 of Dara/Velcade /Dex. Transition to q 3 week daratumumab .  12) 06/04/2020:  Cycle 8 Day 1 of Dara/Velcade /Dex 13) 06/25/2020:  Cycle 9 Day 1 of Dara/Velcade /Dex 14) 07/22/2020: Cycle 10 Day  1 of  Dara//Dex 15) 08/18/2020: Cycle 11 Day 1 of Dara//Dex 16) 09/23/2020: Cycle 12 Day 1 of Dara//Dex 17) 10/22/2020: Cycle 13 Day 1 of Dara/Dex  18) 11/19/2020: Cycle 14 Day 1 of Dara/Dex  19) 12/17/2020: Cycle 15 Day 1 of Dara/Dex  20) 01/17/2021: Cycle 16 Day 1 of Dara/Dex  21) 02/11/2021: Cycle 17 Day 1 of Dara/Dex 22) 03/10/2021: Cycle 18 Day 1 of Dara/Dex  23) 04/08/2021: Cycle 19 Day 1 of Dara/Dex  24) 08/03/2021: Cycle 20 Day 1 of Dara/Dex (delayed due to scheduling error) 25) 09/02/2021: Cycle 21 Day 1 of Dara/Dex 26) 09/30/2021: Cycle 22 Day 1 of Dara/Dex 27) 10/28/2021: Cycle 23 Day 1 of Dara/Dex 28) 12/02/2021: Cycle 24 Day 1 of Dara/Dex 29) 12/30/2021: Cycle 25 Day 1 of Dara/Dex 30) 01/06/2022: Cycle 1 Day 1 of Kyprolis /Dex 31) 02/03/2022: Cycle 2 Day 1 of Kyprolis /Dex 32) 02/24/2022: Cycle 3 Day 1 of Kyprolis /Dex 33) 04/07/2022: Cycle 4 Day 1 of Kyprolis /Dex 34) 05/05/2022: Cycle 5 Day 1 of Kyprolis /Dex 35) 06/01/2022: Cycle 6 Day 1 of Kyprolis /Dex 36) 06/29/2022: Cycle 7 Day 1 of Kyprolis /Dex 37) 07/28/2022: Cycle 8 Day 1 of Kyprolis /Dex 38) 08/25/2022: Cycle 9 Day 1 of Kyprolis /Dex 39) 09/22/2022: Cycle 10 Day 1 of Kyprolis /Dex 40) 10/20/2022: Cycle 11 Day 1 of Kyprolis /Dex 41) 11/17/2022: Cycle 12 Day 1 of Kyprolis /Dex 42 ) 12/22/2022-01/08/2023: Admitted for fournier's gangrene of scrotum.  43) 01/26/2023: Cycle 13 Day 1 of Kyprolis /Dex 44) 02/16/2023: Cycle 14 Day 1 of Kyprolis /Dex 45) 03/16/2023:Cycle 15 Day 1 of Kyprolis /Dex 46) 04/13/2023: Cycle 16 Day 1 of Kyprolis /Dex 47) 05/11/2023: Cycle 17 Day 1 of Kyprolis /Dex 48) 06/07/2023: Cycle 18 Day 1 of Kyprolis /Dex 49) 07/06/2023: Cycle 19 Day 1 of Kyprolis /Dex 50) 08/02/2023: Cycle 20 Day 1 of Kyprolis /Dex 51) 08/31/2023: Cycle 21 Day 1 of  Kyprolis /Dex 52) 09/28/2023: Cycle 22 Day 1 of Kyprolis /Dex 53) 10/26/2023: Cycle 23 Day 1 of Kyprolis /Dex 54) 11/23/2023: Cycle 24 Day 1 of Kyprolis /Dex  Interval History:  [name] 68 y.o. male with  medical history significant for  IgG Lambda Multiple Myeloma who presents for a follow up visit. The patient's last visit was on 02/22/2024.  He presents today prior to treatment with Kyprolis /Dex. He is unaccompanied for this visit.   On exam today [name] reports he has been doing well overall in the interim since her last visit.  He reports that he has less congestion as she is prescribed a nebulizer.  He reports his energy levels are good his appetite is strong.  He is having normal bowel movements without any diarrhea or constipation.  He reports the Kyprolis  symptoms are minimal and that he is not having any neuropathy or numbness or tingling of his fingers and toes.  He reports that he and his wife are already planning for his birthday in December.  Overall he feels well and is willing and able to continue on Kyprolis  therapy at this time.  A full 10 point ROS is otherwise negative.  MEDICAL HISTORY:  Past Medical History:  Diagnosis Date   Adrenal insufficiency (HCC)    on chronic dexamethasone    Anemia    Cancer (HCC)    Coagulopathy (HCC)    on xeralto/ s/p DVT while on coumadin,  IVC in place   Diabetes mellitus without complication (HCC)    type 2   Glaucoma 12/22/2022   Gross hematuria 01/2013   post foley cath procedure   History of blood transfusion 01/2013   Lower GI bleeding 04/18/2017   Multiple myeloma    thoracic T8 with paraplegia s/p resection- on chemo at visit 10/13/10   Multiple myeloma    Multiple myeloma without mention of remission    Neurogenic bladder    Neurogenic bowel    OSA (obstructive sleep apnea) 11/01/2022   Paraplegia (HCC)    Partial small bowel obstruction (HCC) during dec 2011 admission    SURGICAL HISTORY: Past Surgical History:  Procedure Laterality Date   COLONOSCOPY WITH PROPOFOL  N/A 04/12/2017   Procedure: COLONOSCOPY WITH PROPOFOL ;  Surgeon: Gary Norleen SAILOR, MD;  Location: [facility];  Service: Endoscopy;  Laterality: N/A;    COLONOSCOPY WITH PROPOFOL  N/A 04/19/2017   Procedure: COLONOSCOPY WITH PROPOFOL ;  Surgeon: Gary Elspeth SQUIBB, MD;  Location: [facility];  Service: Gastroenterology;  Laterality: N/A;   COLOSTOMY  07/20/2011   Procedure: COLOSTOMY;  Surgeon: Redell Alm Faith, DO;  Location: [facility] OR;  Service: General;;   COLOSTOMY REVISION  07/20/2011   Procedure: COLON RESECTION SIGMOID;  Surgeon: Redell Alm Faith, DO;  Location: [facility] OR;  Service: General;;   CYSTOSCOPY N/A 04/04/2013   Procedure: CYSTOSCOPY WITH LITHALOPAXY;  Surgeon: Ricardo Likens, MD;  Location: [facility];  Service: Urology;  Laterality: N/A;   INCISION AND DRAINAGE ABSCESS N/A 12/25/2022   Procedure: INCISION AND DRAINAGE OF SCROTUM;  Surgeon: Lovie Arlyss CROME, MD;  Location: [facility];  Service: Urology;  Laterality: N/A;   INSERTION OF SUPRAPUBIC CATHETER N/A 04/04/2013   Procedure: INSERTION OF SUPRAPUBIC CATHETER;  Surgeon: Ricardo Likens, MD;  Location: [facility];  Service: Urology;  Laterality: N/A;   LAPAROTOMY  07/20/2011   Procedure: EXPLORATORY LAPAROTOMY;  Surgeon: Redell Alm Faith, DO;  Location: [facility] OR;  Service: General;  Laterality: N/A;   myeloma thoracic T8 with parpaplegia s/p thoracotomy and thoracic T7-9  cage placement on Dec 26th 2011  07/18/10   SCROTAL EXPLORATION N/A 12/29/2022   Procedure: SCROTAL WOUND DEBRIDEMENT AND CLOSURE;  Surgeon: Alvaro Ricardo KATHEE Mickey., MD;  Location: [facility];  Service: Urology;  Laterality: N/A;    SOCIAL HISTORY: Social History   Socioeconomic History   Marital status: Married    Spouse name: Not on file   Number of children: Not on file   Years of education: Not on file   Highest education level: Not on file  Occupational History   Not on file  Tobacco Use   Smoking status: Never   Smokeless tobacco: Never  Vaping Use   Vaping status: Never Used  Substance and Sexual Activity   Alcohol use: No   Drug use: No   Sexual activity: Never  Other Topics Concern   Not on file  Social  History Narrative   Not on file   Social Drivers of Health   Financial Resource Strain: Not on file  Food Insecurity: No Food Insecurity (12/22/2022)   Hunger Vital Sign    Worried About Running Out of Food in the Last Year: Never true    Ran Out of Food in the Last Year: Never true  Transportation Needs: No Transportation Needs (12/22/2022)   PRAPARE - Administrator, Civil Service (Medical): No    Lack of Transportation (Non-Medical): No  Physical Activity: Not on file  Stress: Not on file  Social Connections: Not on file  Intimate Partner Violence: Not At Risk (12/22/2022)   Humiliation, Afraid, Rape, and Kick questionnaire    Fear of Current or Ex-Partner: No    Emotionally Abused: No    Physically Abused: No    Sexually Abused: No    FAMILY HISTORY: Family History  Problem Relation Age of Onset   Ovarian cancer Mother    Diabetes Father     ALLERGIES:  is allergic to ferumoxytol .  MEDICATIONS:  Current Outpatient Medications  Medication Sig Dispense Refill   acetaminophen  (TYLENOL ) 325 MG tablet Take 325-650 mg by mouth every 6 (six) hours as needed for headache or mild pain.     acetic acid 0.25 % irrigation Irrigate with 1 Application as directed See admin instructions. Instill 50 ml's, clamp for 10 minutes, then remove clamp and drain the bladder. Repeat 3 times a week- when not using the Renacidin irrigation     acyclovir  (ZOVIRAX ) 400 MG tablet Take 1 tablet (400 mg total) by mouth 2 (two) times daily. 60 tablet 2   albuterol  (VENTOLIN  HFA) 108 (90 Base) MCG/ACT inhaler Inhale 2 puffs into the lungs every 4 (four) hours as needed for wheezing or shortness of breath.     atorvastatin  (LIPITOR) 20 MG tablet Take 20 mg by mouth daily.     BENADRYL  ALLERGY 25 MG tablet Take 25 mg by mouth every 6 (six) hours as needed (for allergic reactions).     BIOFREEZE 4 % GEL Apply 1 application  topically every 4 (four) hours as needed (for joint pain- to intact areas  of the skin only).     Citric Ac-Gluconolact-Mg Carb (RENACIDIN IR) Irrigate with 30 mLs as directed See admin instructions. Instill 30 ml's, clamp for 10 minutes, then remove clamp and drain the bladder. Repeat 3 times a week- when not using the acetic acid irrigation     COSOPT  PF 2-0.5 % SOLN Place 1 drop into both eyes 2 (two) times daily.     diazepam  (VALIUM ) 5 MG tablet  Take 5 mg by mouth 2 (two) times daily as needed for muscle spasms (after spinal cord injury).     Emollient (EUCERIN EX) Apply 1 application  topically See admin instructions. Apply to affected area 2 times a day     FLOVENT HFA 110 MCG/ACT inhaler 2 each See admin instructions. 2 sprays to colostomy stoma prior to each colostomy bag change     guaifenesin  (ROBITUSSIN) 100 MG/5ML syrup Take 200 mg by mouth 4 (four) times daily as needed for cough.     iron  polysaccharides (NU-IRON ) 150 MG capsule Take 1 capsule (150 mg total) by mouth daily. 30 capsule 0   latanoprost  (XALATAN ) 0.005 % ophthalmic solution Place 1 drop into both eyes at bedtime.     Multiple Vitamin (MULTIVITAMIN WITH MINERALS) TABS tablet Take 1 tablet by mouth daily. 30 tablet 0   NON FORMULARY Apply 1 application  topically See admin instructions. Colo Plast paste- Apply as directed with each ostomy bag change     ondansetron  (ZOFRAN ) 4 MG tablet Take 1 tablet (4 mg) by mouth every 6 hours as needed for nausea. 20 tablet 0   rivaroxaban  (XARELTO ) 10 MG TABS tablet Take 1 tablet (10 mg total) by mouth daily with supper. (Patient taking differently: Take 10 mg by mouth daily.) 30 tablet 9   Skin Protectants, Misc. (MINERIN CREME) CREA Apply to affected areas 2 times daily as needed. 454 g 0   Zinc  Oxide (BALMEX EX) Apply 1 application  topically See admin instructions. Apply topically daily to affected sites     No current facility-administered medications for this visit.   Facility-Administered Medications Ordered in Other Visits  Medication Dose Route  Frequency Provider Last Rate Last Admin   sodium chloride  flush (NS) 0.9 % injection 10 mL  10 mL Intravenous PRN Lonn, Ni, MD        REVIEW OF SYSTEMS:   Constitutional: ( - ) fevers, ( - )  chills , ( - ) night sweats Eyes: ( - ) blurriness of vision, ( - ) double vision, ( - ) watery eyes Ears, nose, mouth, throat, and face: ( - ) mucositis, ( - ) sore throat Respiratory: ( - ) cough, ( - ) dyspnea, ( - ) wheezes Cardiovascular: ( - ) palpitation, ( - ) chest discomfort, ( - ) lower extremity swelling Gastrointestinal:  ( - ) nausea, ( - ) heartburn, ( - ) change in bowel habits Skin: ( - ) abnormal skin rashes Lymphatics: ( - ) new lymphadenopathy, ( - ) easy bruising Neurological: ( - ) numbness, ( - ) tingling, ( - ) new weaknesses Behavioral/Psych: ( - ) mood change, ( - ) new changes  All other systems were reviewed with the patient and are negative.  PHYSICAL EXAMINATION: ECOG PERFORMANCE STATUS: paraplegic.   There were no vitals filed for this visit.      There were no vitals filed for this visit.        GENERAL: well appearing middle aged [place] American male alert, no distress and comfortable SKIN: skin color, texture, turgor are normal, no rashes or significant lesions EYES: conjunctiva are pink and non-injected, sclera clear LUNGS: clear to auscultation and percussion with normal breathing effort HEART: regular rate & rhythm and no murmurs Musculoskeletal: no cyanosis of digits and no clubbing  PSYCH: alert & oriented x 3, fluent speech NEURO: paraplegic, no use of LE bilaterally.   LABORATORY DATA:  I have reviewed the data as listed  Latest Ref Rng & Units 02/22/2024    8:24 AM 02/15/2024    9:28 AM 02/08/2024    8:24 AM  CBC  WBC 4.0 - 10.5 K/uL 8.0  7.2  6.6   Hemoglobin 13.0 - 17.0 g/dL 89.5  89.2  89.2   Hematocrit 39.0 - 52.0 % 31.4  31.6  31.3   Platelets 150 - 400 K/uL 163  162  375        Latest Ref Rng & Units 02/22/2024    8:24 AM  02/15/2024    9:28 AM 02/08/2024    8:24 AM  CMP  Glucose 70 - 99 mg/dL 856  69  897   BUN 8 - 23 mg/dL 10  18  9    Creatinine 0.61 - 1.24 mg/dL 9.22  9.19  9.24   Sodium 135 - 145 mmol/L 142  144  145   Potassium 3.5 - 5.1 mmol/L 3.5  3.6  3.4   Chloride 98 - 111 mmol/L 108  110  114   CO2 22 - 32 mmol/L 31  29  25    Calcium  8.9 - 10.3 mg/dL 9.1  9.5  9.1   Total Protein 6.5 - 8.1 g/dL 5.8  6.0  6.2   Total Bilirubin 0.0 - 1.2 mg/dL 0.6  0.7  0.7   Alkaline Phos 38 - 126 U/L 88  92  90   AST 15 - 41 U/L 11  12  14    ALT 0 - 44 U/L 16  15  16      Lab Results  Component Value Date   MPROTEIN 0.1 (H) 02/08/2024   MPROTEIN 0.2 (H) 01/18/2024   MPROTEIN Not Observed 12/21/2023   Lab Results  Component Value Date   KPAFRELGTCHN 8.8 02/08/2024   KPAFRELGTCHN 9.3 01/18/2024   KPAFRELGTCHN 7.4 12/21/2023   LAMBDASER 4.6 (L) 02/08/2024   LAMBDASER 6.2 01/18/2024   LAMBDASER 3.6 (L) 12/21/2023   KAPLAMBRATIO 1.91 (H) 02/08/2024   KAPLAMBRATIO 1.50 01/18/2024   KAPLAMBRATIO 2.06 (H) 12/21/2023    RADIOGRAPHIC STUDIES: No results found.     ASSESSMENT & PLAN [name] is a 68 y.o.  male with medical history significant for  IgG Lambda Multiple Myeloma who presents for a follow up visit.  # IgG Lambda Multiple Myeloma, Relapsed (ISS Stage II) --findings are most consistent with relapsed multiple myeloma. Patient previously successfully treated with Velcade /Rev/Dex and Daratumumab /Velcade /Dex. On 07/02/2020 he transitioned to monthly daratumumab  alone.  --due to to rise in M protein, switched to Kyprolis  and Dexamethasone  on 01/06/2022 Plan: --Due for Cycle 27, Day 15 of Kyprolis /Dex today  --Labs show white blood cell count 7.6, Hgb 10.8, MCV 82.0, Plt 371. Creatinine and LFTs are in range.  --Most recent myeloma labs from 02/08/2024 showed M protein 0.1 with kappa 8.8, lambda light chain 4.6, ratio 1.91 --Proceed with treatment today without any dose  modifications. --Continue with weekly labs plus Kyprolis /Dex treatment as scheduled  #Anemia: --Likely secondary to MM +/- iron  deficiency --Currently on PO iron  therapy daily --Will recheck iron  panel periodically.   #Upper respiratory symptoms --No improvement with Z-pack --Suspect allergies, advised to take Zyrtec plus Flonase  # Memory loss: --Encouraged to do memory exercises including puzzles, suduko, etc --Likely secondary to chemotherapy  --If symptoms worsen, we can make a referral to neurology for further evaluation.   #History of Sepsis 2/2 UTI and Fournier's gangrene of scrotum: --Hospitalized from 12/22/2022-01/08/2023. Underwent debridement of necrotic tissue. --Given PIP tazobactam and Zyvox  initially --  Urine culture reveals Proteus mirabilis which is resistant to ciprofloxacin  and nitrofurantoin --Blood cultures negative --D/C on remainder doxycycline  and Augmentin  which he completed on 01/07/2023.  --Evaluated by urology on 7/2 and ID on 7/3 with continued wound healing and no further d/c or evidence of infection.   #History of DVT --He had placement of IVC filter, remains on Xarelto . --Due to poor mobility, and lack of bleeding complications, recommend to continue on Xarelto  indefinitely. --caution if Plt count were to drop <50  #Snoring/Risk for OSA: --Diagnosed with OSA and has CPAP machine.   # Supportive Care -- provided patient with an albuterol  inhaler (for use with daratumumab ) --acyclovir  400mg  BID for VZV prophylaxis --zofran  8mg  q8H PRN for nausea/vomiting  --Zometa  is being held in the setting of his prior episode of osteonecrosis of the jaw  No orders of the defined types were placed in this encounter.   All questions were answered. The patient knows to call the clinic with any problems, questions or concerns.  I have spent a total of 30 minutes minutes of face-to-face and non-face-to-face time, preparing to see the patient,  performing a  medically appropriate examination, counseling and educating the patient, communicating with other health care professionals, documenting clinical information in the electronic health record,  and care coordination.   Norleen IVAR Kidney, MD Department of Hematology/Oncology [facility] at [facility] Phone: [phone] Pager: [phone] Email: [email or web address]  03/07/2024 7:56 AM   Literature Support:  Bringhen S, Mina R, Petrucci MT, Gaidano G, Ballanti S, Musto P, Offidani M, Spada S, Benevolo G, Ponticelli E, Galieni P, Cavo M, Di Toritto TC, Di Raimondo F, Montefusco V, Palumbo A, Boccadoro M, Larocca A. Once-weekly versus twice-weekly carfilzomib  in patients with newly diagnosed multiple myeloma: a pooled analysis of two phase I/II studies. Haematologica. 2019 Aug;104(8):1640-1647.  --Once-weekly 70 mg/m2 carfilzomib  as induction and maintenance therapy for newly diagnosed multiple myeloma patients was as safe and effective as twice-weekly 36 mg/m2 carfilzomib  and provided a more convenient schedule.

## 2024-03-07 NOTE — Patient Instructions (Signed)
 [facility] - A DEPT OF [facility]  Discharge Instructions: Thank you for choosing [facility] to provide your oncology and hematology care.   If you have a lab appointment with the Cancer Center, please go directly to the Cancer Center and check in at the registration area.   Wear comfortable clothing and clothing appropriate for easy access to any Portacath or PICC line.   We strive to give you quality time with your provider. You may need to reschedule your appointment if you arrive late (15 or more minutes).  Arriving late affects you and other patients whose appointments are after yours.  Also, if you miss three or more appointments without notifying the office, you may be dismissed from the clinic at the provider's discretion.      For prescription refill requests, have your pharmacy contact our office and allow 72 hours for refills to be completed.    Today you received the following chemotherapy and/or immunotherapy agents Kyprolis      To help prevent nausea and vomiting after your treatment, we encourage you to take your nausea medication as directed.  BELOW ARE SYMPTOMS THAT SHOULD BE REPORTED IMMEDIATELY: *FEVER GREATER THAN 100.4 F (38 C) OR HIGHER *CHILLS OR SWEATING *NAUSEA AND VOMITING THAT IS NOT CONTROLLED WITH YOUR NAUSEA MEDICATION *UNUSUAL SHORTNESS OF BREATH *UNUSUAL BRUISING OR BLEEDING *URINARY PROBLEMS (pain or burning when urinating, or frequent urination) *BOWEL PROBLEMS (unusual diarrhea, constipation, pain near the anus) TENDERNESS IN MOUTH AND THROAT WITH OR WITHOUT PRESENCE OF ULCERS (sore throat, sores in mouth, or a toothache) UNUSUAL RASH, SWELLING OR PAIN  UNUSUAL VAGINAL DISCHARGE OR ITCHING   Items with * indicate a potential emergency and should be followed up as soon as possible or go to the Emergency Department if any problems should occur.  Please show the CHEMOTHERAPY ALERT CARD or IMMUNOTHERAPY  ALERT CARD at check-in to the Emergency Department and triage nurse.  Should you have questions after your visit or need to cancel or reschedule your appointment, please contact [facility] - A DEPT OF [facility]  Dept: [phone]  and follow the prompts.  Office hours are 8:00 a.m. to 4:30 p.m. Monday - Friday. Please note that voicemails left after 4:00 p.m. may not be returned until the following business day.  We are closed weekends and major holidays. You have access to a nurse at all times for urgent questions. Please call the main number to the clinic Dept: [phone] and follow the prompts.   For any non-urgent questions, you may also contact your provider using MyChart. We now offer e-Visits for anyone 49 and older to request care online for non-urgent symptoms. For details visit [email or web address].   Also download the MyChart app! Go to the app store, search "MyChart", open the app, select [facility] [place], and log in with your MyChart username and password.

## 2024-03-09 ENCOUNTER — Encounter: Payer: Self-pay | Admitting: Hematology and Oncology

## 2024-03-10 LAB — KAPPA/LAMBDA LIGHT CHAINS
Kappa free light chain: 6.9 mg/L (ref 3.3–19.4)
Kappa, lambda light chain ratio: 1.53 (ref 0.26–1.65)
Lambda free light chains: 4.5 mg/L — ABNORMAL LOW (ref 5.7–26.3)

## 2024-03-11 LAB — MULTIPLE MYELOMA PANEL, SERUM
Albumin SerPl Elph-Mcnc: 3.2 g/dL (ref 2.9–4.4)
Albumin/Glob SerPl: 1.3 (ref 0.7–1.7)
Alpha 1: 0.3 g/dL (ref 0.0–0.4)
Alpha2 Glob SerPl Elph-Mcnc: 0.9 g/dL (ref 0.4–1.0)
B-Globulin SerPl Elph-Mcnc: 1 g/dL (ref 0.7–1.3)
Gamma Glob SerPl Elph-Mcnc: 0.4 g/dL (ref 0.4–1.8)
Globulin, Total: 2.6 g/dL (ref 2.2–3.9)
IgA: 45 mg/dL — ABNORMAL LOW (ref 61–437)
IgG (Immunoglobin G), Serum: 511 mg/dL — ABNORMAL LOW (ref 603–1613)
IgM (Immunoglobulin M), Srm: 15 mg/dL — ABNORMAL LOW (ref 20–172)
M Protein SerPl Elph-Mcnc: 0.1 g/dL — ABNORMAL HIGH
Total Protein ELP: 5.8 g/dL — ABNORMAL LOW (ref 6.0–8.5)

## 2024-03-14 ENCOUNTER — Inpatient Hospital Stay: Payer: Medicare (Managed Care)

## 2024-03-14 VITALS — BP 150/86 | HR 98 | Temp 98.2°F | Resp 22

## 2024-03-14 DIAGNOSIS — C9002 Multiple myeloma in relapse: Secondary | ICD-10-CM

## 2024-03-14 DIAGNOSIS — Z5111 Encounter for antineoplastic chemotherapy: Secondary | ICD-10-CM | POA: Diagnosis not present

## 2024-03-14 LAB — CMP (CANCER CENTER ONLY)
ALT: 15 U/L (ref 0–44)
AST: 13 U/L — ABNORMAL LOW (ref 15–41)
Albumin: 3.6 g/dL (ref 3.5–5.0)
Alkaline Phosphatase: 93 U/L (ref 38–126)
Anion gap: 7 (ref 5–15)
BUN: 14 mg/dL (ref 8–23)
CO2: 26 mmol/L (ref 22–32)
Calcium: 8.9 mg/dL (ref 8.9–10.3)
Chloride: 111 mmol/L (ref 98–111)
Creatinine: 0.67 mg/dL (ref 0.61–1.24)
GFR, Estimated: >60 mL/min (ref >60)
Glucose, Bld: 117 mg/dL — ABNORMAL HIGH (ref 70–99)
Potassium: 3.7 mmol/L (ref 3.5–5.1)
Sodium: 144 mmol/L (ref 135–145)
Total Bilirubin: 0.7 mg/dL (ref 0.0–1.2)
Total Protein: 5.7 g/dL — ABNORMAL LOW (ref 6.5–8.1)

## 2024-03-14 LAB — CBC WITH DIFFERENTIAL (CANCER CENTER ONLY)
Abs Immature Granulocytes: 0.05 K/uL (ref 0.00–0.07)
Basophils Absolute: 0 K/uL (ref 0.0–0.1)
Basophils Relative: 0 %
Eosinophils Absolute: 0.4 K/uL (ref 0.0–0.5)
Eosinophils Relative: 5 %
HCT: 33.9 % — ABNORMAL LOW (ref 39.0–52.0)
Hemoglobin: 11.4 g/dL — ABNORMAL LOW (ref 13.0–17.0)
Immature Granulocytes: 1 %
Lymphocytes Relative: 13 %
Lymphs Abs: 0.9 K/uL (ref 0.7–4.0)
MCH: 27 pg (ref 26.0–34.0)
MCHC: 33.6 g/dL (ref 30.0–36.0)
MCV: 80.3 fL (ref 80.0–100.0)
Monocytes Absolute: 0.9 K/uL (ref 0.1–1.0)
Monocytes Relative: 13 %
Neutro Abs: 4.8 K/uL (ref 1.7–7.7)
Neutrophils Relative %: 68 %
Platelet Count: 160 K/uL (ref 150–400)
RBC: 4.22 MIL/uL (ref 4.22–5.81)
RDW: 18.6 % — ABNORMAL HIGH (ref 11.5–15.5)
WBC Count: 7.1 K/uL (ref 4.0–10.5)
nRBC: 0.3 % — ABNORMAL HIGH (ref 0.0–0.2)

## 2024-03-14 MED ORDER — SODIUM CHLORIDE 0.9 % IV SOLN
40.0000 mg | Freq: Once | INTRAVENOUS | Status: AC
  Administered 2024-03-14: 40 mg via INTRAVENOUS
  Filled 2024-03-14: qty 4

## 2024-03-14 MED ORDER — SODIUM CHLORIDE 0.9 % IV SOLN
Freq: Once | INTRAVENOUS | Status: AC
Start: 2024-03-14 — End: 2024-03-14

## 2024-03-14 MED ORDER — DEXTROSE 5 % IV SOLN
70.0000 mg/m2 | Freq: Once | INTRAVENOUS | Status: AC
  Administered 2024-03-14: 150 mg via INTRAVENOUS
  Filled 2024-03-14: qty 60

## 2024-03-14 MED ORDER — SODIUM CHLORIDE 0.9 % IV SOLN: Freq: Once | INTRAVENOUS | Status: AC

## 2024-03-14 NOTE — Patient Instructions (Signed)
 [facility] - A DEPT OF [facility]  Discharge Instructions: Thank you for choosing [facility] to provide your oncology and hematology care.   If you have a lab appointment with the Cancer Center, please go directly to the Cancer Center and check in at the registration area.   Wear comfortable clothing and clothing appropriate for easy access to any Portacath or PICC line.   We strive to give you quality time with your provider. You may need to reschedule your appointment if you arrive late (15 or more minutes).  Arriving late affects you and other patients whose appointments are after yours.  Also, if you miss three or more appointments without notifying the office, you may be dismissed from the clinic at the provider's discretion.      For prescription refill requests, have your pharmacy contact our office and allow 72 hours for refills to be completed.    Today you received the following chemotherapy and/or immunotherapy agents Kyprolis      To help prevent nausea and vomiting after your treatment, we encourage you to take your nausea medication as directed.  BELOW ARE SYMPTOMS THAT SHOULD BE REPORTED IMMEDIATELY: *FEVER GREATER THAN 100.4 F (38 C) OR HIGHER *CHILLS OR SWEATING *NAUSEA AND VOMITING THAT IS NOT CONTROLLED WITH YOUR NAUSEA MEDICATION *UNUSUAL SHORTNESS OF BREATH *UNUSUAL BRUISING OR BLEEDING *URINARY PROBLEMS (pain or burning when urinating, or frequent urination) *BOWEL PROBLEMS (unusual diarrhea, constipation, pain near the anus) TENDERNESS IN MOUTH AND THROAT WITH OR WITHOUT PRESENCE OF ULCERS (sore throat, sores in mouth, or a toothache) UNUSUAL RASH, SWELLING OR PAIN  UNUSUAL VAGINAL DISCHARGE OR ITCHING   Items with * indicate a potential emergency and should be followed up as soon as possible or go to the Emergency Department if any problems should occur.  Please show the CHEMOTHERAPY ALERT CARD or IMMUNOTHERAPY  ALERT CARD at check-in to the Emergency Department and triage nurse.  Should you have questions after your visit or need to cancel or reschedule your appointment, please contact [facility] - A DEPT OF [facility]  Dept: [phone]  and follow the prompts.  Office hours are 8:00 a.m. to 4:30 p.m. Monday - Friday. Please note that voicemails left after 4:00 p.m. may not be returned until the following business day.  We are closed weekends and major holidays. You have access to a nurse at all times for urgent questions. Please call the main number to the clinic Dept: [phone] and follow the prompts.   For any non-urgent questions, you may also contact your provider using MyChart. We now offer e-Visits for anyone 49 and older to request care online for non-urgent symptoms. For details visit [email or web address].   Also download the MyChart app! Go to the app store, search "MyChart", open the app, select [facility] [place], and log in with your MyChart username and password.

## 2024-03-21 ENCOUNTER — Inpatient Hospital Stay: Payer: Medicare (Managed Care)

## 2024-03-21 ENCOUNTER — Inpatient Hospital Stay: Payer: Medicare (Managed Care) | Admitting: Physician Assistant

## 2024-03-21 ENCOUNTER — Encounter: Payer: Self-pay | Admitting: Hematology and Oncology

## 2024-03-21 VITALS — BP 120/72 | HR 87 | Temp 97.5°F | Resp 16 | Ht 71.0 in | Wt 275.9 lb

## 2024-03-21 DIAGNOSIS — Z5111 Encounter for antineoplastic chemotherapy: Secondary | ICD-10-CM

## 2024-03-21 DIAGNOSIS — C9002 Multiple myeloma in relapse: Secondary | ICD-10-CM | POA: Diagnosis not present

## 2024-03-21 LAB — CBC WITH DIFFERENTIAL (CANCER CENTER ONLY)
Abs Immature Granulocytes: 0.05 K/uL (ref 0.00–0.07)
Basophils Absolute: 0 K/uL (ref 0.0–0.1)
Basophils Relative: 1 %
Eosinophils Absolute: 0.3 K/uL (ref 0.0–0.5)
Eosinophils Relative: 4 %
HCT: 32.5 % — ABNORMAL LOW (ref 39.0–52.0)
Hemoglobin: 11.1 g/dL — ABNORMAL LOW (ref 13.0–17.0)
Immature Granulocytes: 1 %
Lymphocytes Relative: 10 %
Lymphs Abs: 0.6 K/uL — ABNORMAL LOW (ref 0.7–4.0)
MCH: 27.3 pg (ref 26.0–34.0)
MCHC: 34.2 g/dL (ref 30.0–36.0)
MCV: 80 fL (ref 80.0–100.0)
Monocytes Absolute: 0.8 K/uL (ref 0.1–1.0)
Monocytes Relative: 13 %
Neutro Abs: 4.6 K/uL (ref 1.7–7.7)
Neutrophils Relative %: 71 %
Platelet Count: 163 K/uL (ref 150–400)
RBC: 4.06 MIL/uL — ABNORMAL LOW (ref 4.22–5.81)
RDW: 18.8 % — ABNORMAL HIGH (ref 11.5–15.5)
WBC Count: 6.4 K/uL (ref 4.0–10.5)
nRBC: 0 % (ref 0.0–0.2)

## 2024-03-21 LAB — CMP (CANCER CENTER ONLY)
ALT: 18 U/L (ref 0–44)
AST: 14 U/L — ABNORMAL LOW (ref 15–41)
Albumin: 3.4 g/dL — ABNORMAL LOW (ref 3.5–5.0)
Alkaline Phosphatase: 96 U/L (ref 38–126)
Anion gap: 6 (ref 5–15)
BUN: 15 mg/dL (ref 8–23)
CO2: 27 mmol/L (ref 22–32)
Calcium: 9.4 mg/dL (ref 8.9–10.3)
Chloride: 109 mmol/L (ref 98–111)
Creatinine: 0.83 mg/dL (ref 0.61–1.24)
GFR, Estimated: >60 mL/min (ref >60)
Glucose, Bld: 130 mg/dL — ABNORMAL HIGH (ref 70–99)
Potassium: 3.8 mmol/L (ref 3.5–5.1)
Sodium: 142 mmol/L (ref 135–145)
Total Bilirubin: 0.6 mg/dL (ref 0.0–1.2)
Total Protein: 5.9 g/dL — ABNORMAL LOW (ref 6.5–8.1)

## 2024-03-21 MED ORDER — SODIUM CHLORIDE 0.9 % IV SOLN
40.0000 mg | Freq: Once | INTRAVENOUS | Status: AC
  Administered 2024-03-21: 40 mg via INTRAVENOUS
  Filled 2024-03-21: qty 4

## 2024-03-21 MED ORDER — SODIUM CHLORIDE 0.9 % IV SOLN: Freq: Once | INTRAVENOUS | Status: AC

## 2024-03-21 MED ORDER — DEXTROSE 5 % IV SOLN
70.0000 mg/m2 | Freq: Once | INTRAVENOUS | Status: AC
  Administered 2024-03-21: 150 mg via INTRAVENOUS
  Filled 2024-03-21: qty 60

## 2024-03-21 NOTE — Progress Notes (Signed)
 [facility] [phone]   [phone]  PROGRESS NOTE  Patient Care Team: Cloria Annabella CROME, DO as PCP - General (Geriatric Medicine) Babs Arthea DASEN, MD as Consulting Physician (Physical Medicine and Rehabilitation) Pegge Toribio PARAS, PA-C as Physician Assistant (Physical Medicine and Rehabilitation) Federico Norleen DASEN MADISON, MD as Consulting Physician (Hematology and Oncology) Federico Norleen DASEN MADISON, MD as Consulting Physician (Hematology and Oncology) Federico Norleen DASEN MADISON, MD as Consulting Physician (Hematology and Oncology)  Hematological/Oncological History # IgG Lambda Multiple Myeloma, Relapsed (ISS Stage II) 1) 06/2010: initial diagnosis of Multiple Myeloma after T8 compression fracture. Treated with Velcade /Revlimid /Dexamethasone  and achieved a complete remission 2) Velcade  was discontinued in September 2012 and that Revlimid  and Decadron  were discontinued in March 2013. 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 4) Followed by Dr. Lonn, last clinic visit 10/09/2019. At that time there was concern for relapse of his multiple myeloma.  5) Patient requested transfer to different provider after misunderstanding regarding imaging studies 6) 12/17/2019: transfer care to Dr. Federico  7) 01/09/2020: Cycle 1 Day 1 of Dara/Velcade /Dex 8) 01/21/2020: presented as urgent visit for diarrhea and dehydration. Holding chemotherapy scheduled for 01/23/2020. 9) 01/30/2020: Resume dara/velcade /dex after resolution of diarrhea.  10) 02/13/2020: restaging labs show M protein 0.8, Kappa 4.5, lamba 17.2, ratio 0.26, urine M protein 53 (7.1%). All MM labs indicate improvement.  11) 03/10/2020: Cycle 4 Day 1 of Dara/Velcade /Dex. Transition to q 3 week daratumumab .  12) 06/04/2020:  Cycle 8 Day 1 of Dara/Velcade /Dex 13) 06/25/2020:  Cycle 9 Day 1 of Dara/Velcade /Dex 14) 07/22/2020: Cycle 10 Day  1 of  Dara//Dex 15) 08/18/2020: Cycle 11 Day 1 of Dara//Dex 16) 09/23/2020: Cycle 12 Day 1 of Dara//Dex 17) 10/22/2020: Cycle 13 Day 1 of Dara/Dex  18) 11/19/2020: Cycle 14 Day 1 of Dara/Dex  19) 12/17/2020: Cycle 15 Day 1 of Dara/Dex  20) 01/17/2021: Cycle 16 Day 1 of Dara/Dex  21) 02/11/2021: Cycle 17 Day 1 of Dara/Dex 22) 03/10/2021: Cycle 18 Day 1 of Dara/Dex  23) 04/08/2021: Cycle 19 Day 1 of Dara/Dex  24) 08/03/2021: Cycle 20 Day 1 of Dara/Dex (delayed due to scheduling error) 25) 09/02/2021: Cycle 21 Day 1 of Dara/Dex 26) 09/30/2021: Cycle 22 Day 1 of Dara/Dex 27) 10/28/2021: Cycle 23 Day 1 of Dara/Dex 28) 12/02/2021: Cycle 24 Day 1 of Dara/Dex 29) 12/30/2021: Cycle 25 Day 1 of Dara/Dex 30) 01/06/2022: Cycle 1 Day 1 of Kyprolis /Dex 31) 02/03/2022: Cycle 2 Day 1 of Kyprolis /Dex 32) 02/24/2022: Cycle 3 Day 1 of Kyprolis /Dex 33) 04/07/2022: Cycle 4 Day 1 of Kyprolis /Dex 34) 05/05/2022: Cycle 5 Day 1 of Kyprolis /Dex 35) 06/01/2022: Cycle 6 Day 1 of Kyprolis /Dex 36) 06/29/2022: Cycle 7 Day 1 of Kyprolis /Dex 37) 07/28/2022: Cycle 8 Day 1 of Kyprolis /Dex 38) 08/25/2022: Cycle 9 Day 1 of Kyprolis /Dex 39) 09/22/2022: Cycle 10 Day 1 of Kyprolis /Dex 40) 10/20/2022: Cycle 11 Day 1 of Kyprolis /Dex 41) 11/17/2022: Cycle 12 Day 1 of Kyprolis /Dex 42 ) 12/22/2022-01/08/2023: Admitted for fournier's gangrene of scrotum.  43) 01/26/2023: Cycle 13 Day 1 of Kyprolis /Dex 44) 02/16/2023: Cycle 14 Day 1 of Kyprolis /Dex 45) 03/16/2023:Cycle 15 Day 1 of Kyprolis /Dex 46) 04/13/2023: Cycle 16 Day 1 of Kyprolis /Dex 47) 05/11/2023: Cycle 17 Day 1 of Kyprolis /Dex 48) 06/07/2023: Cycle 18 Day 1 of Kyprolis /Dex 49) 07/06/2023: Cycle 19 Day 1 of Kyprolis /Dex 50) 08/02/2023: Cycle 20 Day 1 of Kyprolis /Dex 51) 08/31/2023: Cycle 21 Day 1 of  Kyprolis /Dex 52) 09/28/2023: Cycle 22 Day 1 of Kyprolis /Dex 53) 10/26/2023: Cycle 23 Day 1 of Kyprolis /Dex 54) 11/23/2023: Cycle 24 Day 1 of Kyprolis /Dex 55) 12/21/2023: Cycle 25 Day 1 of Kyprolis /Dex 56)  01/18/2024: Cycle 26 Day 1 of Kyprolis /Dex 57) 02/08/2024: Cycle 27 Day 1 of Kyprolis /Dex 58) 03/07/2024: Cycle 28 Day 1 of Kyprolis /Dex  Interval History:  [name] 68 y.o. male with medical history significant for  IgG Lambda Multiple Myeloma who presents for a follow up visit. The patient's last visit was on 03/07/2024.  He presents today prior to treatment with Kyprolis /Dex. He is unaccompanied for this visit.   On exam today [name] reports he is doing well and tolerating treatment without any new or concerning symptoms.  His energy levels are fairly stable.  He has a good appetite without any weight changes.  He denies nausea, vomiting or changes in his bowel movements.  Denies easy bruising or signs of active bleeding.  He denies fevers, chills, night sweats, shortness of breath, chest pain or cough.  He has no other complaints.  Overall he feels well and is willing and able to continue on Kyprolis  therapy at this time.  A full 10 point ROS is otherwise negative.  MEDICAL HISTORY:  Past Medical History:  Diagnosis Date   Adrenal insufficiency (HCC)    on chronic dexamethasone    Anemia    Cancer (HCC)    Coagulopathy (HCC)    on xeralto/ s/p DVT while on coumadin,  IVC in place   Diabetes mellitus without complication (HCC)    type 2   Glaucoma 12/22/2022   Gross hematuria 01/2013   post foley cath procedure   History of blood transfusion 01/2013   Lower GI bleeding 04/18/2017   Multiple myeloma    thoracic T8 with paraplegia s/p resection- on chemo at visit 10/13/10   Multiple myeloma    Multiple myeloma without mention of remission    Neurogenic bladder    Neurogenic bowel    OSA (obstructive sleep apnea) 11/01/2022   Paraplegia (HCC)    Partial small bowel obstruction (HCC) during dec 2011 admission    SURGICAL HISTORY: Past Surgical History:  Procedure Laterality Date   COLONOSCOPY WITH PROPOFOL  N/A 04/12/2017   Procedure: COLONOSCOPY WITH PROPOFOL ;  Surgeon:  Abran Norleen SAILOR, MD;  Location: [facility];  Service: Endoscopy;  Laterality: N/A;   COLONOSCOPY WITH PROPOFOL  N/A 04/19/2017   Procedure: COLONOSCOPY WITH PROPOFOL ;  Surgeon: Leigh Elspeth SQUIBB, MD;  Location: [facility];  Service: Gastroenterology;  Laterality: N/A;   COLOSTOMY  07/20/2011   Procedure: COLOSTOMY;  Surgeon: Redell Alm Faith, DO;  Location[facility] OR;  Service: General;;   COLOSTOMY REVISION  07/20/2011   Procedure: COLON RESECTION SIGMOID;  Surgeon: Redell Alm Faith, DO;  Location: [facility] OR;  Service: General;;   CYSTOSCOPY N/A 04/04/2013   Procedure: CYSTOSCOPY WITH LITHALOPAXY;  Surgeon: Ricardo Likens, MD;  Location: [facility];  Service: Urology;  Laterality: N/A;   INCISION AND DRAINAGE ABSCESS N/A 12/25/2022   Procedure: INCISION AND DRAINAGE OF SCROTUM;  Surgeon: Lovie Arlyss CROME, MD;  Location: [facility];  Service: Urology;  Laterality: N/A;   INSERTION OF SUPRAPUBIC CATHETER N/A 04/04/2013   Procedure: INSERTION OF SUPRAPUBIC CATHETER;  Surgeon: Ricardo Likens, MD;  Location: [facility];  Service: Urology;  Laterality: N/A;   LAPAROTOMY  07/20/2011   Procedure: EXPLORATORY LAPAROTOMY;  Surgeon: Redell Alm Faith, DO;  Location: [facility] OR;  Service: General;  Laterality: N/A;  myeloma thoracic T8 with parpaplegia s/p thoracotomy and thoracic T7-9 cage placement on Dec 26th 2011  07/18/10   SCROTAL EXPLORATION N/A 12/29/2022   Procedure: SCROTAL WOUND DEBRIDEMENT AND CLOSURE;  Surgeon: Alvaro Ricardo KATHEE Mickey., MD;  Location: [facility];  Service: Urology;  Laterality: N/A;    SOCIAL HISTORY: Social History   Socioeconomic History   Marital status: Married    Spouse name: Not on file   Number of children: Not on file   Years of education: Not on file   Highest education level: Not on file  Occupational History   Not on file  Tobacco Use   Smoking status: Never   Smokeless tobacco: Never  Vaping Use   Vaping status: Never Used  Substance and Sexual Activity   Alcohol use: No    Drug use: No   Sexual activity: Never  Other Topics Concern   Not on file  Social History Narrative   Not on file   Social Drivers of Health   Financial Resource Strain: Not on file  Food Insecurity: No Food Insecurity (12/22/2022)   Hunger Vital Sign    Worried About Running Out of Food in the Last Year: Never true    Ran Out of Food in the Last Year: Never true  Transportation Needs: No Transportation Needs (12/22/2022)   PRAPARE - Administrator, Civil Service (Medical): No    Lack of Transportation (Non-Medical): No  Physical Activity: Not on file  Stress: Not on file  Social Connections: Not on file  Intimate Partner Violence: Not At Risk (12/22/2022)   Humiliation, Afraid, Rape, and Kick questionnaire    Fear of Current or Ex-Partner: No    Emotionally Abused: No    Physically Abused: No    Sexually Abused: No    FAMILY HISTORY: Family History  Problem Relation Age of Onset   Ovarian cancer Mother    Diabetes Father     ALLERGIES:  is allergic to ferumoxytol .  MEDICATIONS:  Current Outpatient Medications  Medication Sig Dispense Refill   acetaminophen  (TYLENOL ) 325 MG tablet Take 325-650 mg by mouth every 6 (six) hours as needed for headache or mild pain.     acetic acid 0.25 % irrigation Irrigate with 1 Application as directed See admin instructions. Instill 50 ml's, clamp for 10 minutes, then remove clamp and drain the bladder. Repeat 3 times a week- when not using the Renacidin irrigation     acyclovir  (ZOVIRAX ) 400 MG tablet Take 1 tablet (400 mg total) by mouth 2 (two) times daily. 60 tablet 2   albuterol  (VENTOLIN  HFA) 108 (90 Base) MCG/ACT inhaler Inhale 2 puffs into the lungs every 4 (four) hours as needed for wheezing or shortness of breath.     atorvastatin  (LIPITOR) 20 MG tablet Take 20 mg by mouth daily.     BENADRYL  ALLERGY 25 MG tablet Take 25 mg by mouth every 6 (six) hours as needed (for allergic reactions).     BIOFREEZE 4 % GEL Apply 1  application  topically every 4 (four) hours as needed (for joint pain- to intact areas of the skin only).     Citric Ac-Gluconolact-Mg Carb (RENACIDIN IR) Irrigate with 30 mLs as directed See admin instructions. Instill 30 ml's, clamp for 10 minutes, then remove clamp and drain the bladder. Repeat 3 times a week- when not using the acetic acid irrigation     COSOPT  PF 2-0.5 % SOLN Place 1 drop into both eyes 2 (two) times  daily.     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     Emollient (EUCERIN EX) Apply 1 application  topically See admin instructions. Apply to affected area 2 times a day     FLOVENT HFA 110 MCG/ACT inhaler 2 each See admin instructions. 2 sprays to colostomy stoma prior to each colostomy bag change     guaifenesin  (ROBITUSSIN) 100 MG/5ML syrup Take 200 mg by mouth 4 (four) times daily as needed for cough.     iron  polysaccharides (NU-IRON ) 150 MG capsule Take 1 capsule (150 mg total) by mouth daily. 30 capsule 0   latanoprost  (XALATAN ) 0.005 % ophthalmic solution Place 1 drop into both eyes at bedtime.     Multiple Vitamin (MULTIVITAMIN WITH MINERALS) TABS tablet Take 1 tablet by mouth daily. 30 tablet 0   NON FORMULARY Apply 1 application  topically See admin instructions. Colo Plast paste- Apply as directed with each ostomy bag change     ondansetron  (ZOFRAN ) 4 MG tablet Take 1 tablet (4 mg) by mouth every 6 hours as needed for nausea. 20 tablet 0   rivaroxaban  (XARELTO ) 10 MG TABS tablet Take 1 tablet (10 mg total) by mouth daily with supper. (Patient taking differently: Take 10 mg by mouth daily.) 30 tablet 9   Skin Protectants, Misc. (MINERIN CREME) CREA Apply to affected areas 2 times daily as needed. 454 g 0   Zinc  Oxide (BALMEX EX) Apply 1 application  topically See admin instructions. Apply topically daily to affected sites     No current facility-administered medications for this visit.    Facility-Administered Medications Ordered in Other Visits  Medication Dose Route Frequency Provider Last Rate Last Admin   sodium chloride  flush (NS) 0.9 % injection 10 mL  10 mL Intravenous PRN Lonn, Ni, MD        REVIEW OF SYSTEMS:   Constitutional: ( - ) fevers, ( - )  chills , ( - ) night sweats Eyes: ( - ) blurriness of vision, ( - ) double vision, ( - ) watery eyes Ears, nose, mouth, throat, and face: ( - ) mucositis, ( - ) sore throat Respiratory: ( - ) cough, ( - ) dyspnea, ( - ) wheezes Cardiovascular: ( - ) palpitation, ( - ) chest discomfort, ( - ) lower extremity swelling Gastrointestinal:  ( - ) nausea, ( - ) heartburn, ( - ) change in bowel habits Skin: ( - ) abnormal skin rashes Lymphatics: ( - ) new lymphadenopathy, ( - ) easy bruising Neurological: ( - ) numbness, ( - ) tingling, ( - ) new weaknesses Behavioral/Psych: ( - ) mood change, ( - ) new changes  All other systems were reviewed with the patient and are negative.  PHYSICAL EXAMINATION: ECOG PERFORMANCE STATUS: paraplegic.   Vitals:   03/21/24 0939  BP: 120/72  Pulse: 87  Resp: 16  Temp: (!) 97.5 F (36.4 C)  SpO2: 98%        Filed Weights   03/21/24 0939  Weight: 275 lb 14.4 oz (125.1 kg)          GENERAL: well appearing middle aged [place] American male alert, no distress and comfortable SKIN: skin color, texture, turgor are normal, no rashes or significant lesions EYES: conjunctiva are pink and non-injected, sclera clear LUNGS: clear to auscultation and percussion with normal breathing effort HEART: regular rate & rhythm and no murmurs  Musculoskeletal: no cyanosis of digits and no clubbing  PSYCH: alert & oriented x 3, fluent speech NEURO: paraplegic, no use of LE bilaterally.   LABORATORY DATA:  I have reviewed the data as listed    Latest Ref Rng & Units 03/21/2024    9:18 AM 03/14/2024    9:16 AM 03/07/2024   11:18 AM  CBC  WBC 4.0 - 10.5 K/uL 6.4  7.1  7.6   Hemoglobin  13.0 - 17.0 g/dL 88.8  88.5  89.1   Hematocrit 39.0 - 52.0 % 32.5  33.9  31.9   Platelets 150 - 400 K/uL 163  160  371        Latest Ref Rng & Units 03/21/2024    9:18 AM 03/14/2024    9:16 AM 03/07/2024   11:18 AM  CMP  Glucose 70 - 99 mg/dL 869  882  896   BUN 8 - 23 mg/dL 15  14  8    Creatinine 0.61 - 1.24 mg/dL 9.16  9.32  9.33   Sodium 135 - 145 mmol/L 142  144  144   Potassium 3.5 - 5.1 mmol/L 3.8  3.7  3.4   Chloride 98 - 111 mmol/L 109  111  109   CO2 22 - 32 mmol/L 27  26  27    Calcium  8.9 - 10.3 mg/dL 9.4  8.9  9.1   Total Protein 6.5 - 8.1 g/dL 5.9  5.7  6.2   Total Bilirubin 0.0 - 1.2 mg/dL 0.6  0.7  0.6   Alkaline Phos 38 - 126 U/L 96  93  97   AST 15 - 41 U/L 14  13  15    ALT 0 - 44 U/L 18  15  15      Lab Results  Component Value Date   MPROTEIN 0.1 (H) 03/07/2024   MPROTEIN 0.1 (H) 02/08/2024   MPROTEIN 0.2 (H) 01/18/2024   Lab Results  Component Value Date   KPAFRELGTCHN 6.9 03/07/2024   KPAFRELGTCHN 8.8 02/08/2024   KPAFRELGTCHN 9.3 01/18/2024   LAMBDASER 4.5 (L) 03/07/2024   LAMBDASER 4.6 (L) 02/08/2024   LAMBDASER 6.2 01/18/2024   KAPLAMBRATIO 1.53 03/07/2024   KAPLAMBRATIO 1.91 (H) 02/08/2024   KAPLAMBRATIO 1.50 01/18/2024    RADIOGRAPHIC STUDIES: No results found.     ASSESSMENT & PLAN [name] is a 68 y.o.  male with medical history significant for  IgG Lambda Multiple Myeloma who presents for a follow up visit.  # IgG Lambda Multiple Myeloma, Relapsed (ISS Stage II) --findings are most consistent with relapsed multiple myeloma. Patient previously successfully treated with Velcade /Rev/Dex and Daratumumab /Velcade /Dex. On 07/02/2020 he transitioned to monthly daratumumab  alone.  --due to to rise in M protein, switched to Kyprolis  and Dexamethasone  on 01/06/2022 Plan: --Due for Cycle 28, Day 15 of Kyprolis /Dex today  --Labs show white blood cell count 6.4, Hgb 11.1, MCV 80.0, Plt 163. Creatinine and LFTs are in range.  --Most recent  myeloma labs from 03/07/2024 showed M protein 0.1 with kappa 6.9, lambda light chain 4.5, ratio 1.53 --Proceed with treatment today without any dose modifications. --Continue with weekly labs plus Kyprolis /Dex treatment as scheduled  #Anemia: --Likely secondary to MM +/- iron  deficiency --Currently on PO iron  therapy daily --Will recheck iron  panel periodically.   #Upper respiratory symptoms --No improvement with Z-pack --Suspect allergies, advised to take Zyrtec plus Flonase  # Memory loss: --Encouraged to do memory exercises including puzzles, suduko, etc --Likely secondary to chemotherapy  --If symptoms worsen,  we can make a referral to neurology for further evaluation.   #History of Sepsis 2/2 UTI and Fournier's gangrene of scrotum: --Hospitalized from 12/22/2022-01/08/2023. Underwent debridement of necrotic tissue. --Given PIP tazobactam and Zyvox  initially --Urine culture reveals Proteus mirabilis which is resistant to ciprofloxacin  and nitrofurantoin --Blood cultures negative --D/C on remainder doxycycline  and Augmentin  which he completed on 01/07/2023.  --Evaluated by urology on 7/2 and ID on 7/3 with continued wound healing and no further d/c or evidence of infection.   #History of DVT --He had placement of IVC filter, remains on Xarelto . --Due to poor mobility, and lack of bleeding complications, recommend to continue on Xarelto  indefinitely. --caution if Plt count were to drop <50  #Snoring/Risk for OSA: --Diagnosed with OSA and has CPAP machine.   # Supportive Care -- provided patient with an albuterol  inhaler (for use with daratumumab ) --acyclovir  400mg  BID for VZV prophylaxis --zofran  8mg  q8H PRN for nausea/vomiting  --Zometa  is being held in the setting of his prior episode of osteonecrosis of the jaw  No orders of the defined types were placed in this encounter.   All questions were answered. The patient knows to call the clinic with any problems, questions or  concerns.  I have spent a total of 30 minutes minutes of face-to-face and non-face-to-face time, preparing to see the patient,  performing a medically appropriate examination, counseling and educating the patient, communicating with other health care professionals, documenting clinical information in the electronic health record,  and care coordination.   Johnston Police PA-C Dept of Hematology and Oncology [facility] Phone: [phone]   03/21/2024 11:21 AM   Literature Support:  Bringhen S, Mina R, Petrucci MT, Gaidano G, Ballanti S, Musto P, Offidani M, Spada S, Benevolo G, Ponticelli E, Galieni P, Cavo M, Di Toritto TC, Di Raimondo F, Montefusco V, Palumbo A, Boccadoro M, Larocca A. Once-weekly versus twice-weekly carfilzomib  in patients with newly diagnosed multiple myeloma: a pooled analysis of two phase I/II studies. Haematologica. 2019 Aug;104(8):1640-1647.  --Once-weekly 70 mg/m2 carfilzomib  as induction and maintenance therapy for newly diagnosed multiple myeloma patients was as safe and effective as twice-weekly 36 mg/m2 carfilzomib  and provided a more convenient schedule.

## 2024-03-21 NOTE — Patient Instructions (Signed)
 [facility] - A DEPT OF [facility]  Discharge Instructions: Thank you for choosing [facility] to provide your oncology and hematology care.   If you have a lab appointment with the Cancer Center, please go directly to the Cancer Center and check in at the registration area.   Wear comfortable clothing and clothing appropriate for easy access to any Portacath or PICC line.   We strive to give you quality time with your provider. You may need to reschedule your appointment if you arrive late (15 or more minutes).  Arriving late affects you and other patients whose appointments are after yours.  Also, if you miss three or more appointments without notifying the office, you may be dismissed from the clinic at the provider's discretion.      For prescription refill requests, have your pharmacy contact our office and allow 72 hours for refills to be completed.    Today you received the following chemotherapy and/or immunotherapy agents Kyprolis      To help prevent nausea and vomiting after your treatment, we encourage you to take your nausea medication as directed.  BELOW ARE SYMPTOMS THAT SHOULD BE REPORTED IMMEDIATELY: *FEVER GREATER THAN 100.4 F (38 C) OR HIGHER *CHILLS OR SWEATING *NAUSEA AND VOMITING THAT IS NOT CONTROLLED WITH YOUR NAUSEA MEDICATION *UNUSUAL SHORTNESS OF BREATH *UNUSUAL BRUISING OR BLEEDING *URINARY PROBLEMS (pain or burning when urinating, or frequent urination) *BOWEL PROBLEMS (unusual diarrhea, constipation, pain near the anus) TENDERNESS IN MOUTH AND THROAT WITH OR WITHOUT PRESENCE OF ULCERS (sore throat, sores in mouth, or a toothache) UNUSUAL RASH, SWELLING OR PAIN  UNUSUAL VAGINAL DISCHARGE OR ITCHING   Items with * indicate a potential emergency and should be followed up as soon as possible or go to the Emergency Department if any problems should occur.  Please show the CHEMOTHERAPY ALERT CARD or IMMUNOTHERAPY  ALERT CARD at check-in to the Emergency Department and triage nurse.  Should you have questions after your visit or need to cancel or reschedule your appointment, please contact [facility] - A DEPT OF [facility]  Dept: [phone]  and follow the prompts.  Office hours are 8:00 a.m. to 4:30 p.m. Monday - Friday. Please note that voicemails left after 4:00 p.m. may not be returned until the following business day.  We are closed weekends and major holidays. You have access to a nurse at all times for urgent questions. Please call the main number to the clinic Dept: [phone] and follow the prompts.   For any non-urgent questions, you may also contact your provider using MyChart. We now offer e-Visits for anyone 49 and older to request care online for non-urgent symptoms. For details visit [email or web address].   Also download the MyChart app! Go to the app store, search "MyChart", open the app, select [facility] [place], and log in with your MyChart username and password.

## 2024-03-22 ENCOUNTER — Other Ambulatory Visit: Payer: Self-pay

## 2024-04-04 ENCOUNTER — Inpatient Hospital Stay: Payer: Medicare (Managed Care)

## 2024-04-04 ENCOUNTER — Inpatient Hospital Stay (HOSPITAL_BASED_OUTPATIENT_CLINIC_OR_DEPARTMENT_OTHER): Payer: Medicare (Managed Care) | Admitting: Hematology and Oncology

## 2024-04-04 ENCOUNTER — Encounter: Payer: Self-pay | Admitting: Hematology and Oncology

## 2024-04-04 ENCOUNTER — Inpatient Hospital Stay: Payer: Medicare (Managed Care) | Attending: Physician Assistant

## 2024-04-04 VITALS — BP 142/92 | HR 93 | Temp 97.3°F | Resp 16 | Wt 247.4 lb

## 2024-04-04 DIAGNOSIS — Z5111 Encounter for antineoplastic chemotherapy: Secondary | ICD-10-CM | POA: Diagnosis present

## 2024-04-04 DIAGNOSIS — C9002 Multiple myeloma in relapse: Secondary | ICD-10-CM

## 2024-04-04 DIAGNOSIS — Z86718 Personal history of other venous thrombosis and embolism: Secondary | ICD-10-CM | POA: Diagnosis not present

## 2024-04-04 DIAGNOSIS — D649 Anemia, unspecified: Secondary | ICD-10-CM | POA: Diagnosis not present

## 2024-04-04 DIAGNOSIS — Z7901 Long term (current) use of anticoagulants: Secondary | ICD-10-CM | POA: Diagnosis not present

## 2024-04-04 DIAGNOSIS — Z95828 Presence of other vascular implants and grafts: Secondary | ICD-10-CM | POA: Diagnosis not present

## 2024-04-04 DIAGNOSIS — R413 Other amnesia: Secondary | ICD-10-CM | POA: Diagnosis not present

## 2024-04-04 DIAGNOSIS — G4733 Obstructive sleep apnea (adult) (pediatric): Secondary | ICD-10-CM | POA: Insufficient documentation

## 2024-04-04 DIAGNOSIS — Z87440 Personal history of urinary (tract) infections: Secondary | ICD-10-CM | POA: Insufficient documentation

## 2024-04-04 LAB — CBC WITH DIFFERENTIAL (CANCER CENTER ONLY)
Abs Immature Granulocytes: 0.03 K/uL (ref 0.00–0.07)
Basophils Absolute: 0.1 K/uL (ref 0.0–0.1)
Basophils Relative: 1 %
Eosinophils Absolute: 0.3 K/uL (ref 0.0–0.5)
Eosinophils Relative: 5 %
HCT: 34 % — ABNORMAL LOW (ref 39.0–52.0)
Hemoglobin: 11.5 g/dL — ABNORMAL LOW (ref 13.0–17.0)
Immature Granulocytes: 0 %
Lymphocytes Relative: 14 %
Lymphs Abs: 0.9 K/uL (ref 0.7–4.0)
MCH: 26.9 pg (ref 26.0–34.0)
MCHC: 33.8 g/dL (ref 30.0–36.0)
MCV: 79.6 fL — ABNORMAL LOW (ref 80.0–100.0)
Monocytes Absolute: 0.7 K/uL (ref 0.1–1.0)
Monocytes Relative: 10 %
Neutro Abs: 4.7 K/uL (ref 1.7–7.7)
Neutrophils Relative %: 70 %
Platelet Count: 371 K/uL (ref 150–400)
RBC: 4.27 MIL/uL (ref 4.22–5.81)
RDW: 18.5 % — ABNORMAL HIGH (ref 11.5–15.5)
WBC Count: 6.7 K/uL (ref 4.0–10.5)
nRBC: 0 % (ref 0.0–0.2)

## 2024-04-04 LAB — CMP (CANCER CENTER ONLY)
ALT: 15 U/L (ref 0–44)
AST: 13 U/L — ABNORMAL LOW (ref 15–41)
Albumin: 4 g/dL (ref 3.5–5.0)
Alkaline Phosphatase: 100 U/L (ref 38–126)
Anion gap: 8 (ref 5–15)
BUN: 11 mg/dL (ref 8–23)
CO2: 25 mmol/L (ref 22–32)
Calcium: 9.5 mg/dL (ref 8.9–10.3)
Chloride: 111 mmol/L (ref 98–111)
Creatinine: 0.74 mg/dL (ref 0.61–1.24)
GFR, Estimated: >60 mL/min (ref >60)
Glucose, Bld: 98 mg/dL (ref 70–99)
Potassium: 3.8 mmol/L (ref 3.5–5.1)
Sodium: 144 mmol/L (ref 135–145)
Total Bilirubin: 0.5 mg/dL (ref 0.0–1.2)
Total Protein: 6.4 g/dL — ABNORMAL LOW (ref 6.5–8.1)

## 2024-04-04 MED ORDER — DEXTROSE 5 % IV SOLN
70.0000 mg/m2 | Freq: Once | INTRAVENOUS | Status: AC
  Administered 2024-04-04: 150 mg via INTRAVENOUS
  Filled 2024-04-04: qty 35

## 2024-04-04 MED ORDER — SODIUM CHLORIDE 0.9 % IV SOLN
40.0000 mg | Freq: Once | INTRAVENOUS | Status: AC
  Administered 2024-04-04: 40 mg via INTRAVENOUS
  Filled 2024-04-04: qty 4

## 2024-04-04 MED ORDER — SODIUM CHLORIDE 0.9 % IV SOLN: Freq: Once | INTRAVENOUS | Status: DC

## 2024-04-04 MED ORDER — SODIUM CHLORIDE 0.9 % IV SOLN: Freq: Once | INTRAVENOUS | Status: AC

## 2024-04-04 NOTE — Progress Notes (Unsigned)
 [facility] [phone]   [phone]  PROGRESS NOTE  Patient Care Team: Gary Annabella CROME, DO as PCP - General (Geriatric Medicine) Gary Arthea DASEN, MD as Consulting Physician (Physical Medicine and Rehabilitation) Gary Toribio PARAS, PA-C as Physician Assistant (Physical Medicine and Rehabilitation) Gary Norleen Howell MADISON, MD as Consulting Physician (Hematology and Oncology) Gary Norleen Howell MADISON, MD as Consulting Physician (Hematology and Oncology) Gary Norleen Howell MADISON, MD as Consulting Physician (Hematology and Oncology)  Hematological/Oncological History # IgG Lambda Multiple Myeloma, Relapsed (ISS Stage II) 1) 06/2010: initial diagnosis of Multiple Myeloma after T8 compression fracture. Treated with Velcade /Revlimid /Dexamethasone  and achieved a complete remission 2) Velcade  was discontinued in September 2012 and that Revlimid  and Decadron  were discontinued in March 2013. 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 4) Followed by Dr. Lonn, last clinic visit 10/09/2019. At that time there was concern for relapse of his multiple myeloma.  5) Patient requested transfer to different provider after misunderstanding regarding imaging studies 6) 12/17/2019: transfer care to Dr. Federico  7) 01/09/2020: Cycle 1 Day 1 of Dara/Velcade /Dex 8) 01/21/2020: presented as urgent visit for diarrhea and dehydration. Holding chemotherapy scheduled for 01/23/2020. 9) 01/30/2020: Resume dara/velcade /dex after resolution of diarrhea.  10) 02/13/2020: restaging labs show M protein 0.8, Kappa 4.5, lamba 17.2, ratio 0.26, urine M protein 53 (7.1%). All MM labs indicate improvement.  11) 03/10/2020: Cycle 4 Day 1 of Dara/Velcade /Dex. Transition to q 3 week daratumumab .  12) 06/04/2020:  Cycle 8 Day 1 of Dara/Velcade /Dex 13) 06/25/2020:  Cycle 9 Day 1 of Dara/Velcade /Dex 14) 07/22/2020: Cycle 10 Day  1 of  Dara//Dex 15) 08/18/2020: Cycle 11 Day 1 of Dara//Dex 16) 09/23/2020: Cycle 12 Day 1 of Dara//Dex 17) 10/22/2020: Cycle 13 Day 1 of Dara/Dex  18) 11/19/2020: Cycle 14 Day 1 of Dara/Dex  19) 12/17/2020: Cycle 15 Day 1 of Dara/Dex  20) 01/17/2021: Cycle 16 Day 1 of Dara/Dex  21) 02/11/2021: Cycle 17 Day 1 of Dara/Dex 22) 03/10/2021: Cycle 18 Day 1 of Dara/Dex  23) 04/08/2021: Cycle 19 Day 1 of Dara/Dex  24) 08/03/2021: Cycle 20 Day 1 of Dara/Dex (delayed due to scheduling error) 25) 09/02/2021: Cycle 21 Day 1 of Dara/Dex 26) 09/30/2021: Cycle 22 Day 1 of Dara/Dex 27) 10/28/2021: Cycle 23 Day 1 of Dara/Dex 28) 12/02/2021: Cycle 24 Day 1 of Dara/Dex 29) 12/30/2021: Cycle 25 Day 1 of Dara/Dex 30) 01/06/2022: Cycle 1 Day 1 of Kyprolis /Dex 31) 02/03/2022: Cycle 2 Day 1 of Kyprolis /Dex 32) 02/24/2022: Cycle 3 Day 1 of Kyprolis /Dex 33) 04/07/2022: Cycle 4 Day 1 of Kyprolis /Dex 34) 05/05/2022: Cycle 5 Day 1 of Kyprolis /Dex 35) 06/01/2022: Cycle 6 Day 1 of Kyprolis /Dex 36) 06/29/2022: Cycle 7 Day 1 of Kyprolis /Dex 37) 07/28/2022: Cycle 8 Day 1 of Kyprolis /Dex 38) 08/25/2022: Cycle 9 Day 1 of Kyprolis /Dex 39) 09/22/2022: Cycle 10 Day 1 of Kyprolis /Dex 40) 10/20/2022: Cycle 11 Day 1 of Kyprolis /Dex 41) 11/17/2022: Cycle 12 Day 1 of Kyprolis /Dex 42 ) 12/22/2022-01/08/2023: Admitted for fournier's gangrene of scrotum.  43) 01/26/2023: Cycle 13 Day 1 of Kyprolis /Dex 44) 02/16/2023: Cycle 14 Day 1 of Kyprolis /Dex 45) 03/16/2023:Cycle 15 Day 1 of Kyprolis /Dex 46) 04/13/2023: Cycle 16 Day 1 of Kyprolis /Dex 47) 05/11/2023: Cycle 17 Day 1 of Kyprolis /Dex 48) 06/07/2023: Cycle 18 Day 1 of Kyprolis /Dex 49) 07/06/2023: Cycle 19 Day 1 of Kyprolis /Dex 50) 08/02/2023: Cycle 20 Day 1 of Kyprolis /Dex 51) 08/31/2023: Cycle 21 Day 1 of  Kyprolis /Dex 52) 09/28/2023: Cycle 22 Day 1 of Kyprolis /Dex 53) 10/26/2023: Cycle 23 Day 1 of Kyprolis /Dex 54) 11/23/2023: Cycle 24 Day 1 of Kyprolis /Dex 55) 12/21/2023: Cycle 25 Day 1 of Kyprolis /Dex 56)  01/18/2024: Cycle 26 Day 1 of Kyprolis /Dex 57) 02/08/2024: Cycle 27 Day 1 of Kyprolis /Dex 58) 03/07/2024: Cycle 28 Day 1 of Kyprolis /Dex  Interval History:  [name] 68 y.o. male with medical history significant for  IgG Lambda Multiple Myeloma who presents for a follow up visit. The patient's last visit was on 03/21/2024.  He presents today prior to treatment with Kyprolis /Dex. He is unaccompanied for this visit.   On exam today [name] reports he has been well overall since our last visit with no major changes in his health.  He reports his energy is good and his appetite is strong.  He reports that he is tolerating the treatment with no side effects.  He denies numbness or tingling of the fingers or toes. He does have some occasional loose stools but for the most part is at his baseline.  He notes that he has had no recent infectious symptoms such as fevers, chills, sweats, nausea, vomiting or diarrhea.  Overall he feels quite well and is willing and able to continue with treatment at this time.  A full 10 point ROS is otherwise negative.  MEDICAL HISTORY:  Past Medical History:  Diagnosis Date   Adrenal insufficiency (HCC)    on chronic dexamethasone    Anemia    Cancer (HCC)    Coagulopathy (HCC)    on xeralto/ s/p DVT while on coumadin,  IVC in place   Diabetes mellitus without complication (HCC)    type 2   Glaucoma 12/22/2022   Gross hematuria 01/2013   post foley cath procedure   History of blood transfusion 01/2013   Lower GI bleeding 04/18/2017   Multiple myeloma    thoracic T8 with paraplegia s/p resection- on chemo at visit 10/13/10   Multiple myeloma    Multiple myeloma without mention of remission    Neurogenic bladder    Neurogenic bowel    OSA (obstructive sleep apnea) 11/01/2022   Paraplegia (HCC)    Partial small bowel obstruction (HCC) during dec 2011 admission    SURGICAL HISTORY: Past Surgical History:  Procedure Laterality Date   COLONOSCOPY WITH  PROPOFOL  N/A 04/12/2017   Procedure: COLONOSCOPY WITH PROPOFOL ;  Surgeon: Abran Norleen SAILOR, MD;  Location: [facility];  Service: Endoscopy;  Laterality: N/A;   COLONOSCOPY WITH PROPOFOL  N/A 04/19/2017   Procedure: COLONOSCOPY WITH PROPOFOL ;  Surgeon: Leigh Elspeth SQUIBB, MD;  Location: [facility];  Service: Gastroenterology;  Laterality: N/A;   COLOSTOMY  07/20/2011   Procedure: COLOSTOMY;  Surgeon: Redell Alm Faith, DO;  Location: [facility] OR;  Service: General;;   COLOSTOMY REVISION  07/20/2011   Procedure: COLON RESECTION SIGMOID;  Surgeon: Redell Alm Faith, DO;  Location: [facility] OR;  Service: General;;   CYSTOSCOPY N/A 04/04/2013   Procedure: CYSTOSCOPY WITH LITHALOPAXY;  Surgeon: Ricardo Likens, MD;  Location: [facility];  Service: Urology;  Laterality: N/A;   INCISION AND DRAINAGE ABSCESS N/A 12/25/2022   Procedure: INCISION AND DRAINAGE OF SCROTUM;  Surgeon: Lovie Arlyss CROME, MD;  Location: [facility];  Service: Urology;  Laterality: N/A;   INSERTION OF SUPRAPUBIC CATHETER N/A 04/04/2013   Procedure: INSERTION OF SUPRAPUBIC CATHETER;  Surgeon: Ricardo Likens, MD;  Location: [facility];  Service: Urology;  Laterality: N/A;   LAPAROTOMY  07/20/2011   Procedure: EXPLORATORY LAPAROTOMY;  Surgeon: Redell Alm Faith, DO;  Location: [facility] OR;  Service: General;  Laterality: N/A;   myeloma thoracic T8 with parpaplegia s/p thoracotomy and thoracic T7-9 cage placement on Dec 26th 2011  07/18/10   SCROTAL EXPLORATION N/A 12/29/2022   Procedure: SCROTAL WOUND DEBRIDEMENT AND CLOSURE;  Surgeon: Alvaro Ricardo KATHEE Mickey., MD;  Location: [facility];  Service: Urology;  Laterality: N/A;    SOCIAL HISTORY: Social History   Socioeconomic History   Marital status: Married    Spouse name: Not on file   Number of children: Not on file   Years of education: Not on file   Highest education level: Not on file  Occupational History   Not on file  Tobacco Use   Smoking status: Never   Smokeless tobacco: Never  Vaping Use   Vaping  status: Never Used  Substance and Sexual Activity   Alcohol use: No   Drug use: No   Sexual activity: Never  Other Topics Concern   Not on file  Social History Narrative   Not on file   Social Drivers of Health   Financial Resource Strain: Not on file  Food Insecurity: No Food Insecurity (12/22/2022)   Hunger Vital Sign    Worried About Running Out of Food in the Last Year: Never true    Ran Out of Food in the Last Year: Never true  Transportation Needs: No Transportation Needs (12/22/2022)   PRAPARE - Administrator, Civil Service (Medical): No    Lack of Transportation (Non-Medical): No  Physical Activity: Not on file  Stress: Not on file  Social Connections: Not on file  Intimate Partner Violence: Not At Risk (12/22/2022)   Humiliation, Afraid, Rape, and Kick questionnaire    Fear of Current or Ex-Partner: No    Emotionally Abused: No    Physically Abused: No    Sexually Abused: No    FAMILY HISTORY: Family History  Problem Relation Age of Onset   Ovarian cancer Mother    Diabetes Father     ALLERGIES:  is allergic to ferumoxytol .  MEDICATIONS:  Current Outpatient Medications  Medication Sig Dispense Refill   acetaminophen  (TYLENOL ) 325 MG tablet Take 325-650 mg by mouth every 6 (six) hours as needed for headache or mild pain.     acetic acid 0.25 % irrigation Irrigate with 1 Application as directed See admin instructions. Instill 50 ml's, clamp for 10 minutes, then remove clamp and drain the bladder. Repeat 3 times a week- when not using the Renacidin irrigation     acyclovir  (ZOVIRAX ) 400 MG tablet Take 1 tablet (400 mg total) by mouth 2 (two) times daily. 60 tablet 2   albuterol  (VENTOLIN  HFA) 108 (90 Base) MCG/ACT inhaler Inhale 2 puffs into the lungs every 4 (four) hours as needed for wheezing or shortness of breath.     atorvastatin  (LIPITOR) 20 MG tablet Take 20 mg by mouth daily.     BENADRYL  ALLERGY 25 MG tablet Take 25 mg by mouth every 6 (six)  hours as needed (for allergic reactions).     BIOFREEZE 4 % GEL Apply 1 application  topically every 4 (four) hours as needed (for joint pain- to intact areas of the skin only).     Citric Ac-Gluconolact-Mg Carb (RENACIDIN IR) Irrigate with 30 mLs as directed See admin instructions. Instill 30 ml's, clamp for 10 minutes, then remove clamp and drain the bladder. Repeat 3 times a week- when not using the acetic acid irrigation  COSOPT  PF 2-0.5 % SOLN Place 1 drop into both eyes 2 (two) times daily.     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     Emollient (EUCERIN EX) Apply 1 application  topically See admin instructions. Apply to affected area 2 times a day     FLOVENT HFA 110 MCG/ACT inhaler 2 each See admin instructions. 2 sprays to colostomy stoma prior to each colostomy bag change     guaifenesin  (ROBITUSSIN) 100 MG/5ML syrup Take 200 mg by mouth 4 (four) times daily as needed for cough.     iron  polysaccharides (NU-IRON ) 150 MG capsule Take 1 capsule (150 mg total) by mouth daily. 30 capsule 0   latanoprost  (XALATAN ) 0.005 % ophthalmic solution Place 1 drop into both eyes at bedtime.     Multiple Vitamin (MULTIVITAMIN WITH MINERALS) TABS tablet Take 1 tablet by mouth daily. 30 tablet 0   NON FORMULARY Apply 1 application  topically See admin instructions. Colo Plast paste- Apply as directed with each ostomy bag change     ondansetron  (ZOFRAN ) 4 MG tablet Take 1 tablet (4 mg) by mouth every 6 hours as needed for nausea. 20 tablet 0   rivaroxaban  (XARELTO ) 10 MG TABS tablet Take 1 tablet (10 mg total) by mouth daily with supper. (Patient taking differently: Take 10 mg by mouth daily.) 30 tablet 9   Skin Protectants, Misc. (MINERIN CREME) CREA Apply to affected areas 2 times daily as needed. 454 g 0   Zinc  Oxide (BALMEX EX) Apply 1 application  topically See admin instructions. Apply topically daily to affected  sites     No current facility-administered medications for this visit.   Facility-Administered Medications Ordered in Other Visits  Medication Dose Route Frequency Provider Last Rate Last Admin   sodium chloride  flush (NS) 0.9 % injection 10 mL  10 mL Intravenous PRN [name], Ni, MD        REVIEW OF SYSTEMS:   Constitutional: ( - ) fevers, ( - )  chills , ( - ) night sweats Eyes: ( - ) blurriness of vision, ( - ) double vision, ( - ) watery eyes Ears, nose, mouth, throat, and face: ( - ) mucositis, ( - ) sore throat Respiratory: ( - ) cough, ( - ) dyspnea, ( - ) wheezes Cardiovascular: ( - ) palpitation, ( - ) chest discomfort, ( - ) lower extremity swelling Gastrointestinal:  ( - ) nausea, ( - ) heartburn, ( - ) change in bowel habits Skin: ( - ) abnormal skin rashes Lymphatics: ( - ) new lymphadenopathy, ( - ) easy bruising Neurological: ( - ) numbness, ( - ) tingling, ( - ) new weaknesses Behavioral/Psych: ( - ) mood change, ( - ) new changes  All other systems were reviewed with the patient and are negative.  PHYSICAL EXAMINATION: ECOG PERFORMANCE STATUS: paraplegic.   Vitals:   04/04/24 1029  BP: (!) 142/92  Pulse: 93  Resp: 16  Temp: (!) 97.3 F (36.3 C)  SpO2: 100%         Filed Weights   04/04/24 1029  Weight: 247 lb 6.4 oz (112.2 kg)           GENERAL: well appearing middle aged [place] American male alert, no distress and comfortable SKIN: skin color, texture, turgor are normal, no rashes or significant lesions EYES: conjunctiva are pink and non-injected, sclera clear LUNGS:  clear to auscultation and percussion with normal breathing effort HEART: regular rate & rhythm and no murmurs Musculoskeletal: no cyanosis of digits and no clubbing  PSYCH: alert & oriented x 3, fluent speech NEURO: paraplegic, no use of LE bilaterally.   LABORATORY DATA:  I have reviewed the data as listed    Latest Ref Rng & Units 04/04/2024    9:50 AM 03/21/2024    9:18  AM 03/14/2024    9:16 AM  CBC  WBC 4.0 - 10.5 K/uL 6.7  6.4  7.1   Hemoglobin 13.0 - 17.0 g/dL 88.4  88.8  88.5   Hematocrit 39.0 - 52.0 % 34.0  32.5  33.9   Platelets 150 - 400 K/uL 371  163  160        Latest Ref Rng & Units 04/04/2024    9:50 AM 03/21/2024    9:18 AM 03/14/2024    9:16 AM  CMP  Glucose 70 - 99 mg/dL 98  869  882   BUN 8 - 23 mg/dL 11  15  14    Creatinine 0.61 - 1.24 mg/dL 9.25  9.16  9.32   Sodium 135 - 145 mmol/L 144  142  144   Potassium 3.5 - 5.1 mmol/L 3.8  3.8  3.7   Chloride 98 - 111 mmol/L 111  109  111   CO2 22 - 32 mmol/L 25  27  26    Calcium  8.9 - 10.3 mg/dL 9.5  9.4  8.9   Total Protein 6.5 - 8.1 g/dL 6.4  5.9  5.7   Total Bilirubin 0.0 - 1.2 mg/dL 0.5  0.6  0.7   Alkaline Phos 38 - 126 U/L 100  96  93   AST 15 - 41 U/L 13  14  13    ALT 0 - 44 U/L 15  18  15      Lab Results  Component Value Date   MPROTEIN 0.1 (H) 03/07/2024   MPROTEIN 0.1 (H) 02/08/2024   MPROTEIN 0.2 (H) 01/18/2024   Lab Results  Component Value Date   KPAFRELGTCHN 6.9 03/07/2024   KPAFRELGTCHN 8.8 02/08/2024   KPAFRELGTCHN 9.3 01/18/2024   LAMBDASER 4.5 (L) 03/07/2024   LAMBDASER 4.6 (L) 02/08/2024   LAMBDASER 6.2 01/18/2024   KAPLAMBRATIO 1.53 03/07/2024   KAPLAMBRATIO 1.91 (H) 02/08/2024   KAPLAMBRATIO 1.50 01/18/2024    RADIOGRAPHIC STUDIES: No results found.     ASSESSMENT & PLAN [name] is a 68 y.o.  male with medical history significant for  IgG Lambda Multiple Myeloma who presents for a follow up visit.  # IgG Lambda Multiple Myeloma, Relapsed (ISS Stage II) --findings are most consistent with relapsed multiple myeloma. Patient previously successfully treated with Velcade /Rev/Dex and Daratumumab /Velcade /Dex. On 07/02/2020 he transitioned to monthly daratumumab  alone.  --due to to rise in M protein, switched to Kyprolis  and Dexamethasone  on 01/06/2022 Plan: --Due for Cycle 28, Day 15 of Kyprolis /Dex today  --Labs show white blood cell count 6.7,  hemoglobin 1.5, MCV 79.6, platelets 371. Creatinine and LFTs are in range.  --Most recent myeloma labs from 03/07/2024 showed M protein 0.1 with kappa 6.9, lambda light chain 4.5, ratio 1.53 --Proceed with treatment today without any dose modifications. --Continue with weekly labs plus Kyprolis /Dex treatment as scheduled  #Anemia: --Likely secondary to MM +/- iron  deficiency --Currently on PO iron  therapy daily --Will recheck iron  panel periodically.   #Upper respiratory symptoms --No improvement with Z-pack --Suspect allergies, advised to take Zyrtec plus Flonase  #  Memory loss: --Encouraged to do memory exercises including puzzles, suduko, etc --Likely secondary to chemotherapy  --If symptoms worsen, we can make a referral to neurology for further evaluation.   #History of Sepsis 2/2 UTI and Fournier's gangrene of scrotum: --Hospitalized from 12/22/2022-01/08/2023. Underwent debridement of necrotic tissue. --Given PIP tazobactam and Zyvox  initially --Urine culture reveals Proteus mirabilis which is resistant to ciprofloxacin  and nitrofurantoin --Blood cultures negative --D/C on remainder doxycycline  and Augmentin  which he completed on 01/07/2023.  --Evaluated by urology on 7/2 and ID on 7/3 with continued wound healing and no further d/c or evidence of infection.   #History of DVT --He had placement of IVC filter, remains on Xarelto . --Due to poor mobility, and lack of bleeding complications, recommend to continue on Xarelto  indefinitely. --caution if Plt count were to drop <50  #Snoring/Risk for OSA: --Diagnosed with OSA and has CPAP machine.   # Supportive Care -- provided patient with an albuterol  inhaler (for use with daratumumab ) --acyclovir  400mg  BID for VZV prophylaxis --zofran  8mg  q8H PRN for nausea/vomiting  --Zometa  is being held in the setting of his prior episode of osteonecrosis of the jaw  No orders of the defined types were placed in this encounter.   All  questions were answered. The patient knows to call the clinic with any problems, questions or concerns.  I have spent a total of 30 minutes minutes of face-to-face and non-face-to-face time, preparing to see the patient,  performing a medically appropriate examination, counseling and educating the patient, communicating with other health care professionals, documenting clinical information in the electronic health record,  and care coordination.   Norleen IVAR Kidney, MD Department of Hematology/Oncology [facility] Phone: [phone] Pager: [phone] Email: [email or web address]    04/06/2024 6:28 PM   Literature Support:  Bringhen S, Mina R, Petrucci MT, Gaidano G, Ballanti S, Musto P, Offidani M, Spada S, Benevolo G, Ponticelli E, Galieni P, Cavo M, Di Toritto TC, Di Raimondo F, Montefusco V, Palumbo A, Boccadoro M, Larocca A. Once-weekly versus twice-weekly carfilzomib  in patients with newly diagnosed multiple myeloma: a pooled analysis of two phase I/II studies. Haematologica. 2019 Aug;104(8):1640-1647.  --Once-weekly 70 mg/m2 carfilzomib  as induction and maintenance therapy for newly diagnosed multiple myeloma patients was as safe and effective as twice-weekly 36 mg/m2 carfilzomib  and provided a more convenient schedule.

## 2024-04-06 ENCOUNTER — Encounter: Payer: Self-pay | Admitting: Hematology and Oncology

## 2024-04-07 LAB — KAPPA/LAMBDA LIGHT CHAINS
Kappa free light chain: 6.5 mg/L (ref 3.3–19.4)
Kappa, lambda light chain ratio: 1.55 (ref 0.26–1.65)
Lambda free light chains: 4.2 mg/L — ABNORMAL LOW (ref 5.7–26.3)

## 2024-04-09 LAB — MULTIPLE MYELOMA PANEL, SERUM
Albumin SerPl Elph-Mcnc: 3.1 g/dL (ref 2.9–4.4)
Albumin/Glob SerPl: 1.3 (ref 0.7–1.7)
Alpha 1: 0.3 g/dL (ref 0.0–0.4)
Alpha2 Glob SerPl Elph-Mcnc: 0.9 g/dL (ref 0.4–1.0)
B-Globulin SerPl Elph-Mcnc: 1 g/dL (ref 0.7–1.3)
Gamma Glob SerPl Elph-Mcnc: 0.4 g/dL (ref 0.4–1.8)
Globulin, Total: 2.5 g/dL (ref 2.2–3.9)
IgA: 32 mg/dL — ABNORMAL LOW (ref 61–437)
IgG (Immunoglobin G), Serum: 441 mg/dL — ABNORMAL LOW (ref 603–1613)
IgM (Immunoglobulin M), Srm: 14 mg/dL — ABNORMAL LOW (ref 20–172)
M Protein SerPl Elph-Mcnc: 0.1 g/dL — ABNORMAL HIGH
Total Protein ELP: 5.6 g/dL — ABNORMAL LOW (ref 6.0–8.5)

## 2024-04-10 MED FILL — Dexamethasone Sodium Phosphate Inj 100 MG/10ML: INTRAMUSCULAR | Qty: 4 | Status: AC

## 2024-04-11 ENCOUNTER — Inpatient Hospital Stay: Payer: Medicare (Managed Care)

## 2024-04-11 VITALS — BP 133/78 | HR 69 | Temp 98.1°F | Resp 18

## 2024-04-11 DIAGNOSIS — Z5111 Encounter for antineoplastic chemotherapy: Secondary | ICD-10-CM | POA: Diagnosis not present

## 2024-04-11 DIAGNOSIS — C9002 Multiple myeloma in relapse: Secondary | ICD-10-CM

## 2024-04-11 LAB — CBC WITH DIFFERENTIAL (CANCER CENTER ONLY)
Abs Immature Granulocytes: 0.05 K/uL (ref 0.00–0.07)
Basophils Absolute: 0 K/uL (ref 0.0–0.1)
Basophils Relative: 0 %
Eosinophils Absolute: 0.4 K/uL (ref 0.0–0.5)
Eosinophils Relative: 6 %
HCT: 32 % — ABNORMAL LOW (ref 39.0–52.0)
Hemoglobin: 11 g/dL — ABNORMAL LOW (ref 13.0–17.0)
Immature Granulocytes: 1 %
Lymphocytes Relative: 10 %
Lymphs Abs: 0.7 K/uL (ref 0.7–4.0)
MCH: 27 pg (ref 26.0–34.0)
MCHC: 34.4 g/dL (ref 30.0–36.0)
MCV: 78.6 fL — ABNORMAL LOW (ref 80.0–100.0)
Monocytes Absolute: 0.9 K/uL (ref 0.1–1.0)
Monocytes Relative: 12 %
Neutro Abs: 5.2 K/uL (ref 1.7–7.7)
Neutrophils Relative %: 71 %
Platelet Count: 158 K/uL (ref 150–400)
RBC: 4.07 MIL/uL — ABNORMAL LOW (ref 4.22–5.81)
RDW: 18.7 % — ABNORMAL HIGH (ref 11.5–15.5)
WBC Count: 7.3 K/uL (ref 4.0–10.5)
nRBC: 0 % (ref 0.0–0.2)

## 2024-04-11 LAB — CMP (CANCER CENTER ONLY)
ALT: 13 U/L (ref 0–44)
AST: 11 U/L — ABNORMAL LOW (ref 15–41)
Albumin: 3.6 g/dL (ref 3.5–5.0)
Alkaline Phosphatase: 86 U/L (ref 38–126)
Anion gap: 4 — ABNORMAL LOW (ref 5–15)
BUN: 14 mg/dL (ref 8–23)
CO2: 26 mmol/L (ref 22–32)
Calcium: 9 mg/dL (ref 8.9–10.3)
Chloride: 113 mmol/L — ABNORMAL HIGH (ref 98–111)
Creatinine: 0.74 mg/dL (ref 0.61–1.24)
GFR, Estimated: >60 mL/min (ref >60)
Glucose, Bld: 106 mg/dL — ABNORMAL HIGH (ref 70–99)
Potassium: 3.8 mmol/L (ref 3.5–5.1)
Sodium: 143 mmol/L (ref 135–145)
Total Bilirubin: 0.7 mg/dL (ref 0.0–1.2)
Total Protein: 5.8 g/dL — ABNORMAL LOW (ref 6.5–8.1)

## 2024-04-11 MED ORDER — SODIUM CHLORIDE 0.9 % IV SOLN: Freq: Once | INTRAVENOUS | Status: AC

## 2024-04-11 MED ORDER — SODIUM CHLORIDE 0.9 % IV SOLN: Freq: Once | INTRAVENOUS | Status: DC

## 2024-04-11 MED ORDER — DEXTROSE 5 % IV SOLN
70.0000 mg/m2 | Freq: Once | INTRAVENOUS | Status: AC
  Administered 2024-04-11: 150 mg via INTRAVENOUS
  Filled 2024-04-11: qty 60

## 2024-04-11 MED ORDER — SODIUM CHLORIDE 0.9 % IV SOLN
40.0000 mg | Freq: Once | INTRAVENOUS | Status: AC
  Administered 2024-04-11: 40 mg via INTRAVENOUS
  Filled 2024-04-11: qty 4

## 2024-04-11 NOTE — Patient Instructions (Signed)
 [facility] - A DEPT OF [facility]  Discharge Instructions: Thank you for choosing [facility] to provide your oncology and hematology care.   If you have a lab appointment with the Cancer Center, please go directly to the Cancer Center and check in at the registration area.   Wear comfortable clothing and clothing appropriate for easy access to any Portacath or PICC line.   We strive to give you quality time with your provider. You may need to reschedule your appointment if you arrive late (15 or more minutes).  Arriving late affects you and other patients whose appointments are after yours.  Also, if you miss three or more appointments without notifying the office, you may be dismissed from the clinic at the provider's discretion.      For prescription refill requests, have your pharmacy contact our office and allow 72 hours for refills to be completed.    Today you received the following chemotherapy and/or immunotherapy agents Kyprolis      To help prevent nausea and vomiting after your treatment, we encourage you to take your nausea medication as directed.  BELOW ARE SYMPTOMS THAT SHOULD BE REPORTED IMMEDIATELY: *FEVER GREATER THAN 100.4 F (38 C) OR HIGHER *CHILLS OR SWEATING *NAUSEA AND VOMITING THAT IS NOT CONTROLLED WITH YOUR NAUSEA MEDICATION *UNUSUAL SHORTNESS OF BREATH *UNUSUAL BRUISING OR BLEEDING *URINARY PROBLEMS (pain or burning when urinating, or frequent urination) *BOWEL PROBLEMS (unusual diarrhea, constipation, pain near the anus) TENDERNESS IN MOUTH AND THROAT WITH OR WITHOUT PRESENCE OF ULCERS (sore throat, sores in mouth, or a toothache) UNUSUAL RASH, SWELLING OR PAIN  UNUSUAL VAGINAL DISCHARGE OR ITCHING   Items with * indicate a potential emergency and should be followed up as soon as possible or go to the Emergency Department if any problems should occur.  Please show the CHEMOTHERAPY ALERT CARD or IMMUNOTHERAPY  ALERT CARD at check-in to the Emergency Department and triage nurse.  Should you have questions after your visit or need to cancel or reschedule your appointment, please contact [facility] - A DEPT OF [facility]  Dept: [phone]  and follow the prompts.  Office hours are 8:00 a.m. to 4:30 p.m. Monday - Friday. Please note that voicemails left after 4:00 p.m. may not be returned until the following business day.  We are closed weekends and major holidays. You have access to a nurse at all times for urgent questions. Please call the main number to the clinic Dept: [phone] and follow the prompts.   For any non-urgent questions, you may also contact your provider using MyChart. We now offer e-Visits for anyone 49 and older to request care online for non-urgent symptoms. For details visit [email or web address].   Also download the MyChart app! Go to the app store, search "MyChart", open the app, select [facility] [place], and log in with your MyChart username and password.

## 2024-04-15 ENCOUNTER — Other Ambulatory Visit: Payer: Self-pay

## 2024-04-17 MED FILL — Dexamethasone Sodium Phosphate Inj 100 MG/10ML: INTRAMUSCULAR | Qty: 4 | Status: AC

## 2024-04-17 NOTE — Progress Notes (Unsigned)
 [facility] [phone]   [phone]  PROGRESS NOTE  Patient Care Team: Cloria Annabella CROME, DO as PCP - General (Geriatric Medicine) Babs Arthea DASEN, MD as Consulting Physician (Physical Medicine and Rehabilitation) Pegge Toribio PARAS, PA-C as Physician Assistant (Physical Medicine and Rehabilitation) Federico Norleen DASEN MADISON, MD as Consulting Physician (Hematology and Oncology) Federico Norleen DASEN MADISON, MD as Consulting Physician (Hematology and Oncology) Federico Norleen DASEN MADISON, MD as Consulting Physician (Hematology and Oncology)  Hematological/Oncological History # IgG Lambda Multiple Myeloma, Relapsed (ISS Stage II) 1) 06/2010: initial diagnosis of Multiple Myeloma after T8 compression fracture. Treated with Velcade /Revlimid /Dexamethasone  and achieved a complete remission 2) Velcade  was discontinued in September 2012 and that Revlimid  and Decadron  were discontinued in March 2013. 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 4) Followed by Dr. Lonn, last clinic visit 10/09/2019. At that time there was concern for relapse of his multiple myeloma.  5) Patient requested transfer to different provider after misunderstanding regarding imaging studies 6) 12/17/2019: transfer care to Dr. Federico  7) 01/09/2020: Cycle 1 Day 1 of Dara/Velcade /Dex 8) 01/21/2020: presented as urgent visit for diarrhea and dehydration. Holding chemotherapy scheduled for 01/23/2020. 9) 01/30/2020: Resume dara/velcade /dex after resolution of diarrhea.  10) 02/13/2020: restaging labs show M protein 0.8, Kappa 4.5, lamba 17.2, ratio 0.26, urine M protein 53 (7.1%). All MM labs indicate improvement.  11) 03/10/2020: Cycle 4 Day 1 of Dara/Velcade /Dex. Transition to q 3 week daratumumab .  12) 06/04/2020:  Cycle 8 Day 1 of Dara/Velcade /Dex 13) 06/25/2020:  Cycle 9 Day 1 of Dara/Velcade /Dex 14) 07/22/2020: Cycle 10 Day  1 of  Dara//Dex 15) 08/18/2020: Cycle 11 Day 1 of Dara//Dex 16) 09/23/2020: Cycle 12 Day 1 of Dara//Dex 17) 10/22/2020: Cycle 13 Day 1 of Dara/Dex  18) 11/19/2020: Cycle 14 Day 1 of Dara/Dex  19) 12/17/2020: Cycle 15 Day 1 of Dara/Dex  20) 01/17/2021: Cycle 16 Day 1 of Dara/Dex  21) 02/11/2021: Cycle 17 Day 1 of Dara/Dex 22) 03/10/2021: Cycle 18 Day 1 of Dara/Dex  23) 04/08/2021: Cycle 19 Day 1 of Dara/Dex  24) 08/03/2021: Cycle 20 Day 1 of Dara/Dex (delayed due to scheduling error) 25) 09/02/2021: Cycle 21 Day 1 of Dara/Dex 26) 09/30/2021: Cycle 22 Day 1 of Dara/Dex 27) 10/28/2021: Cycle 23 Day 1 of Dara/Dex 28) 12/02/2021: Cycle 24 Day 1 of Dara/Dex 29) 12/30/2021: Cycle 25 Day 1 of Dara/Dex 30) 01/06/2022: Cycle 1 Day 1 of Kyprolis /Dex 31) 02/03/2022: Cycle 2 Day 1 of Kyprolis /Dex 32) 02/24/2022: Cycle 3 Day 1 of Kyprolis /Dex 33) 04/07/2022: Cycle 4 Day 1 of Kyprolis /Dex 34) 05/05/2022: Cycle 5 Day 1 of Kyprolis /Dex 35) 06/01/2022: Cycle 6 Day 1 of Kyprolis /Dex 36) 06/29/2022: Cycle 7 Day 1 of Kyprolis /Dex 37) 07/28/2022: Cycle 8 Day 1 of Kyprolis /Dex 38) 08/25/2022: Cycle 9 Day 1 of Kyprolis /Dex 39) 09/22/2022: Cycle 10 Day 1 of Kyprolis /Dex 40) 10/20/2022: Cycle 11 Day 1 of Kyprolis /Dex 41) 11/17/2022: Cycle 12 Day 1 of Kyprolis /Dex 42 ) 12/22/2022-01/08/2023: Admitted for fournier's gangrene of scrotum.  43) 01/26/2023: Cycle 13 Day 1 of Kyprolis /Dex 44) 02/16/2023: Cycle 14 Day 1 of Kyprolis /Dex 45) 03/16/2023:Cycle 15 Day 1 of Kyprolis /Dex 46) 04/13/2023: Cycle 16 Day 1 of Kyprolis /Dex 47) 05/11/2023: Cycle 17 Day 1 of Kyprolis /Dex 48) 06/07/2023: Cycle 18 Day 1 of Kyprolis /Dex 49) 07/06/2023: Cycle 19 Day 1 of Kyprolis /Dex 50) 08/02/2023: Cycle 20 Day 1 of Kyprolis /Dex 51) 08/31/2023: Cycle 21 Day 1 of  Kyprolis /Dex 52) 09/28/2023: Cycle 22 Day 1 of Kyprolis /Dex 53) 10/26/2023: Cycle 23 Day 1 of Kyprolis /Dex 54) 11/23/2023: Cycle 24 Day 1 of Kyprolis /Dex 55) 12/21/2023: Cycle 25 Day 1 of Kyprolis /Dex 56)  01/18/2024: Cycle 26 Day 1 of Kyprolis /Dex 57) 02/08/2024: Cycle 27 Day 1 of Kyprolis /Dex 58) 03/07/2024: Cycle 28 Day 1 of Kyprolis /Dex 59) 04/04/2024: Cycle 29 Day 1 of Kyprolis /Dex  Interval History:  [name] 68 y.o. male with medical history significant for  IgG Lambda Multiple Myeloma who presents for a follow up visit. The patient's last visit was on 04/04/2024.  He presents today prior to treatment with Kyprolis /Dex. He is unaccompanied for this visit.   On exam today Mr. [name] reports he is doing well without any new or concerning symptoms.  His energy levels are unchanged.  He reports his appetite is overall stable but has lost approximately 7 pounds since last visit.  He denies nausea, vomiting or any changes to bowel habits.  He denies any bruising or signs of active bleeding.  Patient denies fevers, chills, night sweats, shortness of breath, chest pain or cough.  He has no other complaints.  Rest of 10 point ROS as below. MEDICAL HISTORY:  Past Medical History:  Diagnosis Date   Adrenal insufficiency    on chronic dexamethasone    Anemia    Cancer (HCC)    Coagulopathy    on xeralto/ s/p DVT while on coumadin,  IVC in place   Diabetes mellitus without complication (HCC)    type 2   Glaucoma 12/22/2022   Gross hematuria 01/2013   post foley cath procedure   History of blood transfusion 01/2013   Lower GI bleeding 04/18/2017   Multiple myeloma    thoracic T8 with paraplegia s/p resection- on chemo at visit 10/13/10   Multiple myeloma    Multiple myeloma without mention of remission    Neurogenic bladder    Neurogenic bowel    OSA (obstructive sleep apnea) 11/01/2022   Paraplegia (HCC)    Partial small bowel obstruction (HCC) during dec 2011 admission    SURGICAL HISTORY: Past Surgical History:  Procedure Laterality Date   COLONOSCOPY WITH PROPOFOL  N/A 04/12/2017   Procedure: COLONOSCOPY WITH PROPOFOL ;  Surgeon: Abran Norleen SAILOR, MD;  Location: [facility];   Service: Endoscopy;  Laterality: N/A;   COLONOSCOPY WITH PROPOFOL  N/A 04/19/2017   Procedure: COLONOSCOPY WITH PROPOFOL ;  Surgeon: Leigh Elspeth SQUIBB, MD;  Location: [facility];  Service: Gastroenterology;  Laterality: N/A;   COLOSTOMY  07/20/2011   Procedure: COLOSTOMY;  Surgeon: Redell Alm Faith, DO;  Location: [facility] OR;  Service: General;;   COLOSTOMY REVISION  07/20/2011   Procedure: COLON RESECTION SIGMOID;  Surgeon: Redell Alm Faith, DO;  Location: [facility] OR;  Service: General;;   CYSTOSCOPY N/A 04/04/2013   Procedure: CYSTOSCOPY WITH LITHALOPAXY;  Surgeon: Ricardo Likens, MD;  Location: [facility];  Service: Urology;  Laterality: N/A;   INCISION AND DRAINAGE ABSCESS N/A 12/25/2022   Procedure: INCISION AND DRAINAGE OF SCROTUM;  Surgeon: Lovie Arlyss CROME, MD;  Location: [facility];  Service: Urology;  Laterality: N/A;   INSERTION OF SUPRAPUBIC CATHETER N/A 04/04/2013   Procedure: INSERTION OF SUPRAPUBIC CATHETER;  Surgeon: Ricardo Likens, MD;  Location: [facility];  Service: Urology;  Laterality: N/A;   LAPAROTOMY  07/20/2011   Procedure: EXPLORATORY LAPAROTOMY;  Surgeon: Redell Alm Faith, DO;  Location: [facility] OR;  Service: General;  Laterality: N/A;   myeloma thoracic T8 with parpaplegia s/p thoracotomy and thoracic T7-9  cage placement on Dec 26th 2011  07/18/10   SCROTAL EXPLORATION N/A 12/29/2022   Procedure: SCROTAL WOUND DEBRIDEMENT AND CLOSURE;  Surgeon: Alvaro Ricardo KATHEE Mickey., MD;  Location: [facility];  Service: Urology;  Laterality: N/A;    SOCIAL HISTORY: Social History   Socioeconomic History   Marital status: Married    Spouse name: Not on file   Number of children: Not on file   Years of education: Not on file   Highest education level: Not on file  Occupational History   Not on file  Tobacco Use   Smoking status: Never   Smokeless tobacco: Never  Vaping Use   Vaping status: Never Used  Substance and Sexual Activity   Alcohol use: No   Drug use: No   Sexual activity: Never  Other  Topics Concern   Not on file  Social History Narrative   Not on file   Social Drivers of Health   Financial Resource Strain: Not on file  Food Insecurity: No Food Insecurity (12/22/2022)   Hunger Vital Sign    Worried About Running Out of Food in the Last Year: Never true    Ran Out of Food in the Last Year: Never true  Transportation Needs: No Transportation Needs (12/22/2022)   PRAPARE - Administrator, Civil Service (Medical): No    Lack of Transportation (Non-Medical): No  Physical Activity: Not on file  Stress: Not on file  Social Connections: Not on file  Intimate Partner Violence: Not At Risk (12/22/2022)   Humiliation, Afraid, Rape, and Kick questionnaire    Fear of Current or Ex-Partner: No    Emotionally Abused: No    Physically Abused: No    Sexually Abused: No    FAMILY HISTORY: Family History  Problem Relation Age of Onset   Ovarian cancer Mother    Diabetes Father     ALLERGIES:  is allergic to ferumoxytol .  MEDICATIONS:  Current Outpatient Medications  Medication Sig Dispense Refill   acetaminophen  (TYLENOL ) 325 MG tablet Take 325-650 mg by mouth every 6 (six) hours as needed for headache or mild pain.     acetic acid 0.25 % irrigation Irrigate with 1 Application as directed See admin instructions. Instill 50 ml's, clamp for 10 minutes, then remove clamp and drain the bladder. Repeat 3 times a week- when not using the Renacidin irrigation     acyclovir  (ZOVIRAX ) 400 MG tablet Take 1 tablet (400 mg total) by mouth 2 (two) times daily. 60 tablet 2   albuterol  (VENTOLIN  HFA) 108 (90 Base) MCG/ACT inhaler Inhale 2 puffs into the lungs every 4 (four) hours as needed for wheezing or shortness of breath.     atorvastatin  (LIPITOR) 20 MG tablet Take 20 mg by mouth daily.     BENADRYL  ALLERGY 25 MG tablet Take 25 mg by mouth every 6 (six) hours as needed (for allergic reactions).     BIOFREEZE 4 % GEL Apply 1 application  topically every 4 (four) hours as  needed (for joint pain- to intact areas of the skin only).     Citric Ac-Gluconolact-Mg Carb (RENACIDIN IR) Irrigate with 30 mLs as directed See admin instructions. Instill 30 ml's, clamp for 10 minutes, then remove clamp and drain the bladder. Repeat 3 times a week- when not using the acetic acid irrigation     COSOPT  PF 2-0.5 % SOLN Place 1 drop into both eyes 2 (two) times daily.     diazepam  (VALIUM ) 5 MG tablet  Take 5 mg by mouth 2 (two) times daily as needed for muscle spasms (after spinal cord injury).     Emollient (EUCERIN EX) Apply 1 application  topically See admin instructions. Apply to affected area 2 times a day     FLOVENT HFA 110 MCG/ACT inhaler 2 each See admin instructions. 2 sprays to colostomy stoma prior to each colostomy bag change     guaifenesin  (ROBITUSSIN) 100 MG/5ML syrup Take 200 mg by mouth 4 (four) times daily as needed for cough.     iron  polysaccharides (NU-IRON ) 150 MG capsule Take 1 capsule (150 mg total) by mouth daily. 30 capsule 0   latanoprost  (XALATAN ) 0.005 % ophthalmic solution Place 1 drop into both eyes at bedtime.     Multiple Vitamin (MULTIVITAMIN WITH MINERALS) TABS tablet Take 1 tablet by mouth daily. 30 tablet 0   NON FORMULARY Apply 1 application  topically See admin instructions. Colo Plast paste- Apply as directed with each ostomy bag change     ondansetron  (ZOFRAN ) 4 MG tablet Take 1 tablet (4 mg) by mouth every 6 hours as needed for nausea. 20 tablet 0   rivaroxaban  (XARELTO ) 10 MG TABS tablet Take 1 tablet (10 mg total) by mouth daily with supper. (Patient taking differently: Take 10 mg by mouth daily.) 30 tablet 9   Skin Protectants, Misc. (MINERIN CREME) CREA Apply to affected areas 2 times daily as needed. 454 g 0   Zinc  Oxide (BALMEX EX) Apply 1 application  topically See admin instructions. Apply topically daily to affected sites     No current facility-administered medications for this visit.   Facility-Administered Medications Ordered in  Other Visits  Medication Dose Route Frequency Provider Last Rate Last Admin   sodium chloride  flush (NS) 0.9 % injection 10 mL  10 mL Intravenous PRN Lonn, Ni, MD        REVIEW OF SYSTEMS:   Constitutional: ( - ) fevers, ( - )  chills , ( - ) night sweats Eyes: ( - ) blurriness of vision, ( - ) double vision, ( - ) watery eyes Ears, nose, mouth, throat, and face: ( - ) mucositis, ( - ) sore throat Respiratory: ( - ) cough, ( - ) dyspnea, ( - ) wheezes Cardiovascular: ( - ) palpitation, ( - ) chest discomfort, ( - ) lower extremity swelling Gastrointestinal:  ( - ) nausea, ( - ) heartburn, ( - ) change in bowel habits Skin: ( - ) abnormal skin rashes Lymphatics: ( - ) new lymphadenopathy, ( - ) easy bruising Neurological: ( - ) numbness, ( - ) tingling, ( - ) new weaknesses Behavioral/Psych: ( - ) mood change, ( - ) new changes  All other systems were reviewed with the patient and are negative.  PHYSICAL EXAMINATION: ECOG PERFORMANCE STATUS: paraplegic.   Vitals:   04/18/24 1100  BP: (!) 143/82  Pulse: 75  Resp: 14  Temp: (!) 97.2 F (36.2 C)  SpO2: 100%    Filed Weights   04/18/24 1100  Weight: 240 lb 9.6 oz (109.1 kg)     GENERAL: well appearing middle aged [place] American male alert, no distress and comfortable SKIN: skin color, texture, turgor are normal, no rashes or significant lesions EYES: conjunctiva are pink and non-injected, sclera clear LUNGS: clear to auscultation and percussion with normal breathing effort HEART: regular rate & rhythm and no murmurs Musculoskeletal: no cyanosis of digits and no clubbing  PSYCH: alert & oriented x 3, fluent speech NEURO:  paraplegic, no use of LE bilaterally.   LABORATORY DATA:  I have reviewed the data as listed    Latest Ref Rng & Units 04/18/2024   10:31 AM 04/11/2024    9:48 AM 04/04/2024    9:50 AM  CBC  WBC 4.0 - 10.5 K/uL 8.0  7.3  6.7   Hemoglobin 13.0 - 17.0 g/dL 88.6  88.9  88.4   Hematocrit 39.0 - 52.0 %  33.0  32.0  34.0   Platelets 150 - 400 K/uL 157  158  371        Latest Ref Rng & Units 04/18/2024   10:31 AM 04/11/2024    9:48 AM 04/04/2024    9:50 AM  CMP  Glucose 70 - 99 mg/dL 86  893  98   BUN 8 - 23 mg/dL 12  14  11    Creatinine 0.61 - 1.24 mg/dL 9.21  9.25  9.25   Sodium 135 - 145 mmol/L 144  143  144   Potassium 3.5 - 5.1 mmol/L 3.8  3.8  3.8   Chloride 98 - 111 mmol/L 113  113  111   CO2 22 - 32 mmol/L 26  26  25    Calcium  8.9 - 10.3 mg/dL 9.3  9.0  9.5   Total Protein 6.5 - 8.1 g/dL 6.0  5.8  6.4   Total Bilirubin 0.0 - 1.2 mg/dL 0.8  0.7  0.5   Alkaline Phos 38 - 126 U/L 89  86  100   AST 15 - 41 U/L 12  11  13    ALT 0 - 44 U/L 14  13  15      Lab Results  Component Value Date   MPROTEIN 0.1 (H) 04/04/2024   MPROTEIN 0.1 (H) 03/07/2024   MPROTEIN 0.1 (H) 02/08/2024   Lab Results  Component Value Date   KPAFRELGTCHN 6.5 04/04/2024   KPAFRELGTCHN 6.9 03/07/2024   KPAFRELGTCHN 8.8 02/08/2024   LAMBDASER 4.2 (L) 04/04/2024   LAMBDASER 4.5 (L) 03/07/2024   LAMBDASER 4.6 (L) 02/08/2024   KAPLAMBRATIO 1.55 04/04/2024   KAPLAMBRATIO 1.53 03/07/2024   KAPLAMBRATIO 1.91 (H) 02/08/2024    RADIOGRAPHIC STUDIES: No results found.     ASSESSMENT & PLAN [name] is a 68 y.o.  male with medical history significant for  IgG Lambda Multiple Myeloma who presents for a follow up visit.  # IgG Lambda Multiple Myeloma, Relapsed (ISS Stage II) --findings are most consistent with relapsed multiple myeloma. Patient previously successfully treated with Velcade /Rev/Dex and Daratumumab /Velcade /Dex. On 07/02/2020 he transitioned to monthly daratumumab  alone.  --due to to rise in M protein, switched to Kyprolis  and Dexamethasone  on 01/06/2022 Plan: --Due for Cycle 29, Day 15 of Kyprolis /Dex today  --Labs show WBC 8.0, Hgb 11.3, Plt 157, creatinine and LFTs are in range.  --Most recent myeloma labs from 04/04/2024 showed M protein 0.1 with kappa 6.5, lambda light chain 4.2,  ratio 1.55 --Proceed with treatment today without any dose modifications. --Continue with weekly labs plus Kyprolis /Dex treatment as scheduled  #Weight loss: --Monitor closely, patient's appetite is stable.   #Anemia: --Likely secondary to MM +/- iron  deficiency --Currently on PO iron  therapy daily --Will recheck iron  panel periodically.    #History of Sepsis 2/2 UTI and Fournier's gangrene of scrotum: --Hospitalized from 12/22/2022-01/08/2023. Underwent debridement of necrotic tissue. --Given PIP tazobactam and Zyvox  initially --Urine culture reveals Proteus mirabilis which is resistant to ciprofloxacin  and nitrofurantoin --Blood cultures negative --D/C on remainder doxycycline  and Augmentin  which he  completed on 01/07/2023.  --Evaluated by urology on 7/2 and ID on 7/3 with continued wound healing and no further d/c or evidence of infection.   #History of DVT --He had placement of IVC filter, remains on Xarelto . --Due to poor mobility, and lack of bleeding complications, recommend to continue on Xarelto  indefinitely. --caution if Plt count were to drop <50  #Snoring/Risk for OSA: --Diagnosed with OSA and has CPAP machine.   # Supportive Care -- provided patient with an albuterol  inhaler (for use with daratumumab ) --acyclovir  400mg  BID for VZV prophylaxis --zofran  8mg  q8H PRN for nausea/vomiting  --Zometa  is being held in the setting of his prior episode of osteonecrosis of the jaw  Orders Placed This Encounter  Procedures   Multiple Myeloma Panel (SPEP&IFE w/QIG)    Standing Status:   Future    Expected Date:   06/27/2024    Expiration Date:   06/27/2025   Kappa/lambda light chains    Standing Status:   Future    Expected Date:   06/27/2024    Expiration Date:   06/27/2025   CBC with Differential (Cancer Center Only)    Standing Status:   Future    Expected Date:   06/27/2024    Expiration Date:   06/27/2025   CMP (Cancer Center only)    Standing Status:   Future     Expected Date:   06/27/2024    Expiration Date:   06/27/2025   CBC with Differential (Cancer Center Only)    Standing Status:   Future    Expected Date:   07/04/2024    Expiration Date:   07/04/2025   CMP (Cancer Center only)    Standing Status:   Future    Expected Date:   07/04/2024    Expiration Date:   07/04/2025   CBC with Differential (Cancer Center Only)    Standing Status:   Future    Expected Date:   07/11/2024    Expiration Date:   07/11/2025   CMP (Cancer Center only)    Standing Status:   Future    Expected Date:   07/11/2024    Expiration Date:   07/11/2025    All questions were answered. The patient knows to call the clinic with any problems, questions or concerns.  I have spent a total of 30 minutes minutes of face-to-face and non-face-to-face time, preparing to see the patient,  performing a medically appropriate examination, counseling and educating the patient, communicating with other health care professionals, documenting clinical information in the electronic health record,  and care coordination.   Johnston Police PA-C Dept of Hematology and Oncology [facility] Phone: [phone]     04/18/2024 11:24 AM   Literature Support:  Bringhen S, Mina R, Petrucci MT, Gaidano G, Ballanti S, Musto P, Offidani M, Spada S, Benevolo G, Ponticelli E, Galieni P, Cavo M, Di Toritto TC, Di Raimondo F, Montefusco V, Palumbo A, Boccadoro M, Larocca A. Once-weekly versus twice-weekly carfilzomib  in patients with newly diagnosed multiple myeloma: a pooled analysis of two phase I/II studies. Haematologica. 2019 Aug;104(8):1640-1647.  --Once-weekly 70 mg/m2 carfilzomib  as induction and maintenance therapy for newly diagnosed multiple myeloma patients was as safe and effective as twice-weekly 36 mg/m2 carfilzomib  and provided a more convenient schedule.

## 2024-04-18 ENCOUNTER — Inpatient Hospital Stay: Payer: Medicare (Managed Care)

## 2024-04-18 ENCOUNTER — Inpatient Hospital Stay (HOSPITAL_BASED_OUTPATIENT_CLINIC_OR_DEPARTMENT_OTHER): Payer: Medicare (Managed Care) | Admitting: Physician Assistant

## 2024-04-18 ENCOUNTER — Encounter: Payer: Self-pay | Admitting: Hematology and Oncology

## 2024-04-18 ENCOUNTER — Other Ambulatory Visit: Payer: Self-pay | Admitting: *Deleted

## 2024-04-18 VITALS — BP 143/82 | HR 75 | Temp 97.2°F | Resp 14 | Wt 240.6 lb

## 2024-04-18 DIAGNOSIS — C9002 Multiple myeloma in relapse: Secondary | ICD-10-CM

## 2024-04-18 DIAGNOSIS — Z5111 Encounter for antineoplastic chemotherapy: Secondary | ICD-10-CM | POA: Diagnosis not present

## 2024-04-18 LAB — CMP (CANCER CENTER ONLY)
ALT: 14 U/L (ref 0–44)
AST: 12 U/L — ABNORMAL LOW (ref 15–41)
Albumin: 3.7 g/dL (ref 3.5–5.0)
Alkaline Phosphatase: 89 U/L (ref 38–126)
Anion gap: 5 (ref 5–15)
BUN: 12 mg/dL (ref 8–23)
CO2: 26 mmol/L (ref 22–32)
Calcium: 9.3 mg/dL (ref 8.9–10.3)
Chloride: 113 mmol/L — ABNORMAL HIGH (ref 98–111)
Creatinine: 0.78 mg/dL (ref 0.61–1.24)
GFR, Estimated: >60 mL/min (ref >60)
Glucose, Bld: 86 mg/dL (ref 70–99)
Potassium: 3.8 mmol/L (ref 3.5–5.1)
Sodium: 144 mmol/L (ref 135–145)
Total Bilirubin: 0.8 mg/dL (ref 0.0–1.2)
Total Protein: 6 g/dL — ABNORMAL LOW (ref 6.5–8.1)

## 2024-04-18 LAB — CBC WITH DIFFERENTIAL (CANCER CENTER ONLY)
Abs Immature Granulocytes: 0.03 K/uL (ref 0.00–0.07)
Basophils Absolute: 0 K/uL (ref 0.0–0.1)
Basophils Relative: 0 %
Eosinophils Absolute: 0.3 K/uL (ref 0.0–0.5)
Eosinophils Relative: 4 %
HCT: 33 % — ABNORMAL LOW (ref 39.0–52.0)
Hemoglobin: 11.3 g/dL — ABNORMAL LOW (ref 13.0–17.0)
Immature Granulocytes: 0 %
Lymphocytes Relative: 10 %
Lymphs Abs: 0.8 K/uL (ref 0.7–4.0)
MCH: 27.2 pg (ref 26.0–34.0)
MCHC: 34.2 g/dL (ref 30.0–36.0)
MCV: 79.3 fL — ABNORMAL LOW (ref 80.0–100.0)
Monocytes Absolute: 1.1 K/uL — ABNORMAL HIGH (ref 0.1–1.0)
Monocytes Relative: 14 %
Neutro Abs: 5.6 K/uL (ref 1.7–7.7)
Neutrophils Relative %: 72 %
Platelet Count: 157 K/uL (ref 150–400)
RBC: 4.16 MIL/uL — ABNORMAL LOW (ref 4.22–5.81)
RDW: 19.7 % — ABNORMAL HIGH (ref 11.5–15.5)
WBC Count: 8 K/uL (ref 4.0–10.5)
nRBC: 0.3 % — ABNORMAL HIGH (ref 0.0–0.2)

## 2024-04-18 MED ORDER — DEXTROSE 5 % IV SOLN
70.0000 mg/m2 | Freq: Once | INTRAVENOUS | Status: AC
  Administered 2024-04-18: 150 mg via INTRAVENOUS
  Filled 2024-04-18: qty 60

## 2024-04-18 MED ORDER — SODIUM CHLORIDE 0.9 % IV SOLN: Freq: Once | INTRAVENOUS | Status: AC

## 2024-04-18 MED ORDER — SODIUM CHLORIDE 0.9 % IV SOLN
40.0000 mg | Freq: Once | INTRAVENOUS | Status: AC
  Administered 2024-04-18: 40 mg via INTRAVENOUS
  Filled 2024-04-18: qty 4

## 2024-04-18 NOTE — Patient Instructions (Signed)
 [facility] - A DEPT OF [facility]  Discharge Instructions: Thank you for choosing [facility] to provide your oncology and hematology care.   If you have a lab appointment with the Cancer Center, please go directly to the Cancer Center and check in at the registration area.   Wear comfortable clothing and clothing appropriate for easy access to any Portacath or PICC line.   We strive to give you quality time with your provider. You may need to reschedule your appointment if you arrive late (15 or more minutes).  Arriving late affects you and other patients whose appointments are after yours.  Also, if you miss three or more appointments without notifying the office, you may be dismissed from the clinic at the provider's discretion.      For prescription refill requests, have your pharmacy contact our office and allow 72 hours for refills to be completed.    Today you received the following chemotherapy and/or immunotherapy agents: carfilzomib      To help prevent nausea and vomiting after your treatment, we encourage you to take your nausea medication as directed.  BELOW ARE SYMPTOMS THAT SHOULD BE REPORTED IMMEDIATELY: *FEVER GREATER THAN 100.4 F (38 C) OR HIGHER *CHILLS OR SWEATING *NAUSEA AND VOMITING THAT IS NOT CONTROLLED WITH YOUR NAUSEA MEDICATION *UNUSUAL SHORTNESS OF BREATH *UNUSUAL BRUISING OR BLEEDING *URINARY PROBLEMS (pain or burning when urinating, or frequent urination) *BOWEL PROBLEMS (unusual diarrhea, constipation, pain near the anus) TENDERNESS IN MOUTH AND THROAT WITH OR WITHOUT PRESENCE OF ULCERS (sore throat, sores in mouth, or a toothache) UNUSUAL RASH, SWELLING OR PAIN  UNUSUAL VAGINAL DISCHARGE OR ITCHING   Items with * indicate a potential emergency and should be followed up as soon as possible or go to the Emergency Department if any problems should occur.  Please show the CHEMOTHERAPY ALERT CARD or  IMMUNOTHERAPY ALERT CARD at check-in to the Emergency Department and triage nurse.  Should you have questions after your visit or need to cancel or reschedule your appointment, please contact [facility] - A DEPT OF [facility]  Dept: [phone]  and follow the prompts.  Office hours are 8:00 a.m. to 4:30 p.m. Monday - Friday. Please note that voicemails left after 4:00 p.m. may not be returned until the following business day.  We are closed weekends and major holidays. You have access to a nurse at all times for urgent questions. Please call the main number to the clinic Dept: [phone] and follow the prompts.   For any non-urgent questions, you may also contact your provider using MyChart. We now offer e-Visits for anyone 73 and older to request care online for non-urgent symptoms. For details visit [email or web address].   Also download the MyChart app! Go to the app store, search "MyChart", open the app, select [place], and log in with your MyChart username and password.

## 2024-04-23 ENCOUNTER — Other Ambulatory Visit: Payer: Self-pay

## 2024-04-27 ENCOUNTER — Other Ambulatory Visit: Payer: Self-pay

## 2024-05-01 NOTE — Progress Notes (Unsigned)
 [facility] [phone]   [phone]  PROGRESS NOTE  Patient Care Team: Cloria Annabella CROME, DO as PCP - General (Geriatric Medicine) Babs Arthea DASEN, MD as Consulting Physician (Physical Medicine and Rehabilitation) Pegge Toribio PARAS, PA-C as Physician Assistant (Physical Medicine and Rehabilitation) Federico Norleen DASEN MADISON, MD as Consulting Physician (Hematology and Oncology) Federico Norleen DASEN MADISON, MD as Consulting Physician (Hematology and Oncology) Federico Norleen DASEN MADISON, MD as Consulting Physician (Hematology and Oncology)  Hematological/Oncological History # IgG Lambda Multiple Myeloma, Relapsed (ISS Stage II) 1) 06/2010: initial diagnosis of Multiple Myeloma after T8 compression fracture. Treated with Velcade /Revlimid /Dexamethasone  and achieved a complete remission 2) Velcade  was discontinued in September 2012 and that Revlimid  and Decadron  were discontinued in March 2013. 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 4) Followed by Dr. Lonn, last clinic visit 10/09/2019. At that time there was concern for relapse of his multiple myeloma.  5) Patient requested transfer to different provider after misunderstanding regarding imaging studies 6) 12/17/2019: transfer care to Dr. Federico  7) 01/09/2020: Cycle 1 Day 1 of Dara/Velcade /Dex 8) 01/21/2020: presented as urgent visit for diarrhea and dehydration. Holding chemotherapy scheduled for 01/23/2020. 9) 01/30/2020: Resume dara/velcade /dex after resolution of diarrhea.  10) 02/13/2020: restaging labs show M protein 0.8, Kappa 4.5, lamba 17.2, ratio 0.26, urine M protein 53 (7.1%). All MM labs indicate improvement.  11) 03/10/2020: Cycle 4 Day 1 of Dara/Velcade /Dex. Transition to q 3 week daratumumab .  12) 06/04/2020:  Cycle 8 Day 1 of Dara/Velcade /Dex 13) 06/25/2020:  Cycle 9 Day 1 of Dara/Velcade /Dex 14) 07/22/2020: Cycle 10 Day  1 of  Dara//Dex 15) 08/18/2020: Cycle 11 Day 1 of Dara//Dex 16) 09/23/2020: Cycle 12 Day 1 of Dara//Dex 17) 10/22/2020: Cycle 13 Day 1 of Dara/Dex  18) 11/19/2020: Cycle 14 Day 1 of Dara/Dex  19) 12/17/2020: Cycle 15 Day 1 of Dara/Dex  20) 01/17/2021: Cycle 16 Day 1 of Dara/Dex  21) 02/11/2021: Cycle 17 Day 1 of Dara/Dex 22) 03/10/2021: Cycle 18 Day 1 of Dara/Dex  23) 04/08/2021: Cycle 19 Day 1 of Dara/Dex  24) 08/03/2021: Cycle 20 Day 1 of Dara/Dex (delayed due to scheduling error) 25) 09/02/2021: Cycle 21 Day 1 of Dara/Dex 26) 09/30/2021: Cycle 22 Day 1 of Dara/Dex 27) 10/28/2021: Cycle 23 Day 1 of Dara/Dex 28) 12/02/2021: Cycle 24 Day 1 of Dara/Dex 29) 12/30/2021: Cycle 25 Day 1 of Dara/Dex 30) 01/06/2022: Cycle 1 Day 1 of Kyprolis /Dex 31) 02/03/2022: Cycle 2 Day 1 of Kyprolis /Dex 32) 02/24/2022: Cycle 3 Day 1 of Kyprolis /Dex 33) 04/07/2022: Cycle 4 Day 1 of Kyprolis /Dex 34) 05/05/2022: Cycle 5 Day 1 of Kyprolis /Dex 35) 06/01/2022: Cycle 6 Day 1 of Kyprolis /Dex 36) 06/29/2022: Cycle 7 Day 1 of Kyprolis /Dex 37) 07/28/2022: Cycle 8 Day 1 of Kyprolis /Dex 38) 08/25/2022: Cycle 9 Day 1 of Kyprolis /Dex 39) 09/22/2022: Cycle 10 Day 1 of Kyprolis /Dex 40) 10/20/2022: Cycle 11 Day 1 of Kyprolis /Dex 41) 11/17/2022: Cycle 12 Day 1 of Kyprolis /Dex 42 ) 12/22/2022-01/08/2023: Admitted for fournier's gangrene of scrotum.  43) 01/26/2023: Cycle 13 Day 1 of Kyprolis /Dex 44) 02/16/2023: Cycle 14 Day 1 of Kyprolis /Dex 45) 03/16/2023:Cycle 15 Day 1 of Kyprolis /Dex 46) 04/13/2023: Cycle 16 Day 1 of Kyprolis /Dex 47) 05/11/2023: Cycle 17 Day 1 of Kyprolis /Dex 48) 06/07/2023: Cycle 18 Day 1 of Kyprolis /Dex 49) 07/06/2023: Cycle 19 Day 1 of Kyprolis /Dex 50) 08/02/2023: Cycle 20 Day 1 of Kyprolis /Dex 51) 08/31/2023: Cycle 21 Day 1 of  Kyprolis /Dex 52) 09/28/2023: Cycle 22 Day 1 of Kyprolis /Dex 53) 10/26/2023: Cycle 23 Day 1 of Kyprolis /Dex 54) 11/23/2023: Cycle 24 Day 1 of Kyprolis /Dex 55) 12/21/2023: Cycle 25 Day 1 of Kyprolis /Dex 56)  01/18/2024: Cycle 26 Day 1 of Kyprolis /Dex 57) 02/08/2024: Cycle 27 Day 1 of Kyprolis /Dex 58) 03/07/2024: Cycle 28 Day 1 of Kyprolis /Dex 59) 04/04/2024: Cycle 29 Day 1 of Kyprolis /Dex 60) 05/02/2024: Cycle 30 Day 1 Kyprolis /Dex  Interval History:  [name] 68 y.o. male with medical history significant for  IgG Lambda Multiple Myeloma who presents for a follow up visit. The patient's last visit was on 04/18/2024.  He presents today prior to treatment with Kyprolis /Dex. He is unaccompanied for this visit.   On exam today [name] reports he is doing well without any new or concerning symptoms.  His energy levels are overall stable.  He continues to exercise regularly.  He denies any appetite or weight changes.  He denies nausea, vomiting or bowel habit changes.  He denies easy bruising or active bleeding.  He denies fevers, chills, night sweats, shortness of breath, chest pain, cough, headaches, dizziness or new bone/back pain.  He has no other complaints.  Rest of the 10 point ROS as below.   MEDICAL HISTORY:  Past Medical History:  Diagnosis Date   Adrenal insufficiency    on chronic dexamethasone    Anemia    Cancer (HCC)    Coagulopathy    on xeralto/ s/p DVT while on coumadin,  IVC in place   Diabetes mellitus without complication (HCC)    type 2   Glaucoma 12/22/2022   Gross hematuria 01/2013   post foley cath procedure   History of blood transfusion 01/2013   Lower GI bleeding 04/18/2017   Multiple myeloma    thoracic T8 with paraplegia s/p resection- on chemo at visit 10/13/10   Multiple myeloma    Multiple myeloma without mention of remission    Neurogenic bladder    Neurogenic bowel    OSA (obstructive sleep apnea) 11/01/2022   Paraplegia (HCC)    Partial small bowel obstruction (HCC) during dec 2011 admission    SURGICAL HISTORY: Past Surgical History:  Procedure Laterality Date   COLONOSCOPY WITH PROPOFOL  N/A 04/12/2017   Procedure: COLONOSCOPY WITH PROPOFOL ;   Surgeon: Abran Norleen SAILOR, MD;  Location: [facility];  Service: Endoscopy;  Laterality: N/A;   COLONOSCOPY WITH PROPOFOL  N/A 04/19/2017   Procedure: COLONOSCOPY WITH PROPOFOL ;  Surgeon: Leigh Elspeth SQUIBB, MD;  Location: [facility];  Service: Gastroenterology;  Laterality: N/A;   COLOSTOMY  07/20/2011   Procedure: COLOSTOMY;  Surgeon: Redell Alm Faith, DO;  Location[facility];  Service: General;;   COLOSTOMY REVISION  07/20/2011   Procedure: COLON RESECTION SIGMOID;  Surgeon: Redell Alm Faith, DO;  Location: [facility] OR;  Service: General;;   CYSTOSCOPY N/A 04/04/2013   Procedure: CYSTOSCOPY WITH LITHALOPAXY;  Surgeon: Ricardo Likens, MD;  Location: [facility];  Service: Urology;  Laterality: N/A;   INCISION AND DRAINAGE ABSCESS N/A 12/25/2022   Procedure: INCISION AND DRAINAGE OF SCROTUM;  Surgeon: Lovie Arlyss CROME, MD;  Location: [facility];  Service: Urology;  Laterality: N/A;   INSERTION OF SUPRAPUBIC CATHETER N/A 04/04/2013   Procedure: INSERTION OF SUPRAPUBIC CATHETER;  Surgeon: Ricardo Likens, MD;  Location: [facility];  Service: Urology;  Laterality: N/A;   LAPAROTOMY  07/20/2011   Procedure: EXPLORATORY LAPAROTOMY;  Surgeon: Redell Alm Faith, DO;  Location: [facility] OR;  Service: General;  Laterality: N/A;   myeloma  thoracic T8 with parpaplegia s/p thoracotomy and thoracic T7-9 cage placement on Dec 26th 2011  07/18/10   SCROTAL EXPLORATION N/A 12/29/2022   Procedure: SCROTAL WOUND DEBRIDEMENT AND CLOSURE;  Surgeon: Alvaro Ricardo KATHEE Mickey., MD;  Location: [facility];  Service: Urology;  Laterality: N/A;    SOCIAL HISTORY: Social History   Socioeconomic History   Marital status: Married    Spouse name: Not on file   Number of children: Not on file   Years of education: Not on file   Highest education level: Not on file  Occupational History   Not on file  Tobacco Use   Smoking status: Never   Smokeless tobacco: Never  Vaping Use   Vaping status: Never Used  Substance and Sexual Activity   Alcohol use:  No   Drug use: No   Sexual activity: Never  Other Topics Concern   Not on file  Social History Narrative   Not on file   Social Drivers of Health   Financial Resource Strain: Not on file  Food Insecurity: No Food Insecurity (12/22/2022)   Hunger Vital Sign    Worried About Running Out of Food in the Last Year: Never true    Ran Out of Food in the Last Year: Never true  Transportation Needs: No Transportation Needs (12/22/2022)   PRAPARE - Administrator, Civil Service (Medical): No    Lack of Transportation (Non-Medical): No  Physical Activity: Not on file  Stress: Not on file  Social Connections: Not on file  Intimate Partner Violence: Not At Risk (12/22/2022)   Humiliation, Afraid, Rape, and Kick questionnaire    Fear of Current or Ex-Partner: No    Emotionally Abused: No    Physically Abused: No    Sexually Abused: No    FAMILY HISTORY: Family History  Problem Relation Age of Onset   Ovarian cancer Mother    Diabetes Father     ALLERGIES:  is allergic to ferumoxytol .  MEDICATIONS:  Current Outpatient Medications  Medication Sig Dispense Refill   acetaminophen  (TYLENOL ) 325 MG tablet Take 325-650 mg by mouth every 6 (six) hours as needed for headache or mild pain.     acetic acid 0.25 % irrigation Irrigate with 1 Application as directed See admin instructions. Instill 50 ml's, clamp for 10 minutes, then remove clamp and drain the bladder. Repeat 3 times a week- when not using the Renacidin irrigation     acyclovir  (ZOVIRAX ) 400 MG tablet Take 1 tablet (400 mg total) by mouth 2 (two) times daily. 60 tablet 2   albuterol  (VENTOLIN  HFA) 108 (90 Base) MCG/ACT inhaler Inhale 2 puffs into the lungs every 4 (four) hours as needed for wheezing or shortness of breath.     atorvastatin  (LIPITOR) 20 MG tablet Take 20 mg by mouth daily.     BENADRYL  ALLERGY 25 MG tablet Take 25 mg by mouth every 6 (six) hours as needed (for allergic reactions).     BIOFREEZE 4 % GEL  Apply 1 application  topically every 4 (four) hours as needed (for joint pain- to intact areas of the skin only).     Citric Ac-Gluconolact-Mg Carb (RENACIDIN IR) Irrigate with 30 mLs as directed See admin instructions. Instill 30 ml's, clamp for 10 minutes, then remove clamp and drain the bladder. Repeat 3 times a week- when not using the acetic acid irrigation     COSOPT  PF 2-0.5 % SOLN Place 1 drop into both eyes 2 (two) times daily.  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     Emollient (EUCERIN EX) Apply 1 application  topically See admin instructions. Apply to affected area 2 times a day     FLOVENT HFA 110 MCG/ACT inhaler 2 each See admin instructions. 2 sprays to colostomy stoma prior to each colostomy bag change     guaifenesin  (ROBITUSSIN) 100 MG/5ML syrup Take 200 mg by mouth 4 (four) times daily as needed for cough.     iron  polysaccharides (NU-IRON ) 150 MG capsule Take 1 capsule (150 mg total) by mouth daily. 30 capsule 0   latanoprost  (XALATAN ) 0.005 % ophthalmic solution Place 1 drop into both eyes at bedtime.     Multiple Vitamin (MULTIVITAMIN WITH MINERALS) TABS tablet Take 1 tablet by mouth daily. 30 tablet 0   NON FORMULARY Apply 1 application  topically See admin instructions. Colo Plast paste- Apply as directed with each ostomy bag change     ondansetron  (ZOFRAN ) 4 MG tablet Take 1 tablet (4 mg) by mouth every 6 hours as needed for nausea. 20 tablet 0   rivaroxaban  (XARELTO ) 10 MG TABS tablet Take 1 tablet (10 mg total) by mouth daily with supper. (Patient taking differently: Take 10 mg by mouth daily.) 30 tablet 9   Skin Protectants, Misc. (MINERIN CREME) CREA Apply to affected areas 2 times daily as needed. 454 g 0   Zinc  Oxide (BALMEX EX) Apply 1 application  topically See admin instructions. Apply topically daily to affected sites     No current facility-administered medications for this  visit.   Facility-Administered Medications Ordered in Other Visits  Medication Dose Route Frequency Provider Last Rate Last Admin   sodium chloride  flush (NS) 0.9 % injection 10 mL  10 mL Intravenous PRN Lonn, Ni, MD        REVIEW OF SYSTEMS:   Constitutional: ( - ) fevers, ( - )  chills , ( - ) night sweats Eyes: ( - ) blurriness of vision, ( - ) double vision, ( - ) watery eyes Ears, nose, mouth, throat, and face: ( - ) mucositis, ( - ) sore throat Respiratory: ( - ) cough, ( - ) dyspnea, ( - ) wheezes Cardiovascular: ( - ) palpitation, ( - ) chest discomfort, ( - ) lower extremity swelling Gastrointestinal:  ( - ) nausea, ( - ) heartburn, ( - ) change in bowel habits Skin: ( - ) abnormal skin rashes Lymphatics: ( - ) new lymphadenopathy, ( - ) easy bruising Neurological: ( - ) numbness, ( - ) tingling, ( - ) new weaknesses Behavioral/Psych: ( - ) mood change, ( - ) new changes  All other systems were reviewed with the patient and are negative.  PHYSICAL EXAMINATION: ECOG PERFORMANCE STATUS: paraplegic.   Vitals:   05/02/24 1021 05/02/24 1026  BP: (!) 141/76 116/76  Pulse: 85   Resp: 16   Temp: (!) 97.3 F (36.3 C)   SpO2: 100%      Filed Weights   05/02/24 1021  Weight: 240 lb (108.9 kg)      GENERAL: well appearing middle aged [place] American male alert, no distress and comfortable SKIN: skin color, texture, turgor are normal, no rashes or significant lesions EYES: conjunctiva are pink and non-injected, sclera clear LUNGS: clear to auscultation and percussion with normal breathing effort HEART: regular rate & rhythm and no murmurs Musculoskeletal: no cyanosis of digits and  no clubbing  PSYCH: alert & oriented x 3, fluent speech NEURO: paraplegic, no use of LE bilaterally.   LABORATORY DATA:  I have reviewed the data as listed    Latest Ref Rng & Units 05/02/2024    9:41 AM 04/18/2024   10:31 AM 04/11/2024    9:48 AM  CBC  WBC 4.0 - 10.5 K/uL 6.5  8.0   7.3   Hemoglobin 13.0 - 17.0 g/dL 88.4  88.6  88.9   Hematocrit 39.0 - 52.0 % 34.0  33.0  32.0   Platelets 150 - 400 K/uL 344  157  158        Latest Ref Rng & Units 05/02/2024    9:41 AM 04/18/2024   10:31 AM 04/11/2024    9:48 AM  CMP  Glucose 70 - 99 mg/dL 93  86  893   BUN 8 - 23 mg/dL 9  12  14    Creatinine 0.61 - 1.24 mg/dL 9.32  9.21  9.25   Sodium 135 - 145 mmol/L 143  144  143   Potassium 3.5 - 5.1 mmol/L 3.9  3.8  3.8   Chloride 98 - 111 mmol/L 110  113  113   CO2 22 - 32 mmol/L 28  26  26    Calcium  8.9 - 10.3 mg/dL 9.8  9.3  9.0   Total Protein 6.5 - 8.1 g/dL 6.4  6.0  5.8   Total Bilirubin 0.0 - 1.2 mg/dL 0.8  0.8  0.7   Alkaline Phos 38 - 126 U/L 92  89  86   AST 15 - 41 U/L 14  12  11    ALT 0 - 44 U/L 15  14  13      Lab Results  Component Value Date   MPROTEIN 0.1 (H) 04/04/2024   MPROTEIN 0.1 (H) 03/07/2024   MPROTEIN 0.1 (H) 02/08/2024   Lab Results  Component Value Date   KPAFRELGTCHN 6.5 04/04/2024   KPAFRELGTCHN 6.9 03/07/2024   KPAFRELGTCHN 8.8 02/08/2024   LAMBDASER 4.2 (L) 04/04/2024   LAMBDASER 4.5 (L) 03/07/2024   LAMBDASER 4.6 (L) 02/08/2024   KAPLAMBRATIO 1.55 04/04/2024   KAPLAMBRATIO 1.53 03/07/2024   KAPLAMBRATIO 1.91 (H) 02/08/2024    RADIOGRAPHIC STUDIES: No results found.     ASSESSMENT & PLAN [name] is a 68 y.o.  male with medical history significant for  IgG Lambda Multiple Myeloma who presents for a follow up visit.  # IgG Lambda Multiple Myeloma, Relapsed (ISS Stage II) --findings are most consistent with relapsed multiple myeloma. Patient previously successfully treated with Velcade /Rev/Dex and Daratumumab /Velcade /Dex. On 07/02/2020 he transitioned to monthly daratumumab  alone.  --due to to rise in M protein, switched to Kyprolis  and Dexamethasone  on 01/06/2022 Plan: --Due for Cycle 30, Day 1 of Kyprolis /Dex today  --Labs show WBC 6.5, hemoglobin 11.5, platelets 344, creatinine and calcium  levels are  normal. --Most recent myeloma labs from 04/04/2024 shows M protein stable at 0.1 g/dL and kappa light chain 6.5, lambda light chain 4.2, ratio 1.55.  --No clinical evidence of relapse at this time. --Proceed with treatment today without any dose modifications. --Continue with weekly labs plus Kyprolis /Dex treatment as scheduled  #Anemia: --Likely secondary to MM +/- iron  deficiency --Currently on PO iron  therapy daily --Will recheck iron  panel periodically.   #History of Sepsis 2/2 UTI and Fournier's gangrene of scrotum: --Hospitalized from 12/22/2022-01/08/2023. Underwent debridement of necrotic tissue. --Given PIP tazobactam and Zyvox  initially --Urine culture reveals Proteus mirabilis which is resistant to  ciprofloxacin  and nitrofurantoin --Blood cultures negative --D/C on remainder doxycycline  and Augmentin  which he completed on 01/07/2023.  --Evaluated by urology on 7/2 and ID on 7/3 with continued wound healing and no further d/c or evidence of infection.   #History of DVT --He had placement of IVC filter, remains on Xarelto . --Due to poor mobility, and lack of bleeding complications, recommend to continue on Xarelto  indefinitely. --caution if Plt count were to drop <50  # Supportive Care -- provided patient with an albuterol  inhaler (for use with daratumumab ) --acyclovir  400mg  BID for VZV prophylaxis --zofran  8mg  q8H PRN for nausea/vomiting  --Zometa  is being held in the setting of his prior episode of osteonecrosis of the jaw  No orders of the defined types were placed in this encounter.   All questions were answered. The patient knows to call the clinic with any problems, questions or concerns.  I have spent a total of 30 minutes minutes of face-to-face and non-face-to-face time, preparing to see the patient,  performing a medically appropriate examination, counseling and educating the patient, communicating with other health care professionals, documenting clinical  information in the electronic health record,  and care coordination.   Johnston Police PA-C Dept of Hematology and Oncology [facility] Phone: [phone]     05/02/2024 11:43 AM   Literature Support:  Bringhen S, Mina R, Petrucci MT, Gaidano G, Ballanti S, Musto P, Offidani M, Spada S, Benevolo G, Ponticelli E, Galieni P, Cavo M, Di Toritto TC, Di Raimondo F, Montefusco V, Palumbo A, Boccadoro M, Larocca A. Once-weekly versus twice-weekly carfilzomib  in patients with newly diagnosed multiple myeloma: a pooled analysis of two phase I/II studies. Haematologica. 2019 Aug;104(8):1640-1647.  --Once-weekly 70 mg/m2 carfilzomib  as induction and maintenance therapy for newly diagnosed multiple myeloma patients was as safe and effective as twice-weekly 36 mg/m2 carfilzomib  and provided a more convenient schedule.

## 2024-05-02 ENCOUNTER — Encounter: Payer: Self-pay | Admitting: Hematology and Oncology

## 2024-05-02 ENCOUNTER — Inpatient Hospital Stay: Payer: Medicare (Managed Care)

## 2024-05-02 ENCOUNTER — Inpatient Hospital Stay: Payer: Medicare (Managed Care) | Admitting: Physician Assistant

## 2024-05-02 ENCOUNTER — Other Ambulatory Visit: Payer: Self-pay | Admitting: Hematology and Oncology

## 2024-05-02 ENCOUNTER — Inpatient Hospital Stay: Payer: Medicare (Managed Care) | Attending: Physician Assistant

## 2024-05-02 VITALS — BP 116/76 | HR 85 | Temp 97.3°F | Resp 16 | Ht 68.0 in | Wt 240.0 lb

## 2024-05-02 DIAGNOSIS — C9002 Multiple myeloma in relapse: Secondary | ICD-10-CM

## 2024-05-02 DIAGNOSIS — Z7901 Long term (current) use of anticoagulants: Secondary | ICD-10-CM | POA: Insufficient documentation

## 2024-05-02 DIAGNOSIS — Z5111 Encounter for antineoplastic chemotherapy: Secondary | ICD-10-CM

## 2024-05-02 DIAGNOSIS — D649 Anemia, unspecified: Secondary | ICD-10-CM | POA: Insufficient documentation

## 2024-05-02 DIAGNOSIS — Z86718 Personal history of other venous thrombosis and embolism: Secondary | ICD-10-CM | POA: Insufficient documentation

## 2024-05-02 LAB — CMP (CANCER CENTER ONLY)
ALT: 15 U/L (ref 0–44)
AST: 14 U/L — ABNORMAL LOW (ref 15–41)
Albumin: 3.9 g/dL (ref 3.5–5.0)
Alkaline Phosphatase: 92 U/L (ref 38–126)
Anion gap: 5 (ref 5–15)
BUN: 9 mg/dL (ref 8–23)
CO2: 28 mmol/L (ref 22–32)
Calcium: 9.8 mg/dL (ref 8.9–10.3)
Chloride: 110 mmol/L (ref 98–111)
Creatinine: 0.67 mg/dL (ref 0.61–1.24)
GFR, Estimated: >60 mL/min (ref >60)
Glucose, Bld: 93 mg/dL (ref 70–99)
Potassium: 3.9 mmol/L (ref 3.5–5.1)
Sodium: 143 mmol/L (ref 135–145)
Total Bilirubin: 0.8 mg/dL (ref 0.0–1.2)
Total Protein: 6.4 g/dL — ABNORMAL LOW (ref 6.5–8.1)

## 2024-05-02 LAB — CBC WITH DIFFERENTIAL (CANCER CENTER ONLY)
Abs Immature Granulocytes: 0.03 K/uL (ref 0.00–0.07)
Basophils Absolute: 0 K/uL (ref 0.0–0.1)
Basophils Relative: 1 %
Eosinophils Absolute: 0.3 K/uL (ref 0.0–0.5)
Eosinophils Relative: 5 %
HCT: 34 % — ABNORMAL LOW (ref 39.0–52.0)
Hemoglobin: 11.5 g/dL — ABNORMAL LOW (ref 13.0–17.0)
Immature Granulocytes: 1 %
Lymphocytes Relative: 12 %
Lymphs Abs: 0.8 K/uL (ref 0.7–4.0)
MCH: 26.9 pg (ref 26.0–34.0)
MCHC: 33.8 g/dL (ref 30.0–36.0)
MCV: 79.6 fL — ABNORMAL LOW (ref 80.0–100.0)
Monocytes Absolute: 0.7 K/uL (ref 0.1–1.0)
Monocytes Relative: 11 %
Neutro Abs: 4.6 K/uL (ref 1.7–7.7)
Neutrophils Relative %: 70 %
Platelet Count: 344 K/uL (ref 150–400)
RBC: 4.27 MIL/uL (ref 4.22–5.81)
RDW: 18.7 % — ABNORMAL HIGH (ref 11.5–15.5)
WBC Count: 6.5 K/uL (ref 4.0–10.5)
nRBC: 0 % (ref 0.0–0.2)

## 2024-05-02 MED ORDER — SODIUM CHLORIDE 0.9 % IV SOLN: Freq: Once | INTRAVENOUS | Status: AC

## 2024-05-02 MED ORDER — SODIUM CHLORIDE 0.9 % IV SOLN
40.0000 mg | Freq: Once | INTRAVENOUS | Status: AC
  Administered 2024-05-02: 40 mg via INTRAVENOUS
  Filled 2024-05-02: qty 4

## 2024-05-02 MED ORDER — DEXTROSE 5 % IV SOLN
70.0000 mg/m2 | Freq: Once | INTRAVENOUS | Status: AC
  Administered 2024-05-02: 150 mg via INTRAVENOUS
  Filled 2024-05-02: qty 60

## 2024-05-02 NOTE — Patient Instructions (Signed)
 [facility] - A DEPT OF [facility]  Discharge Instructions: Thank you for choosing [facility] to provide your oncology and hematology care.   If you have a lab appointment with the Cancer Center, please go directly to the Cancer Center and check in at the registration area.   Wear comfortable clothing and clothing appropriate for easy access to any Portacath or PICC line.   We strive to give you quality time with your provider. You may need to reschedule your appointment if you arrive late (15 or more minutes).  Arriving late affects you and other patients whose appointments are after yours.  Also, if you miss three or more appointments without notifying the office, you may be dismissed from the clinic at the provider's discretion.      For prescription refill requests, have your pharmacy contact our office and allow 72 hours for refills to be completed.    Today you received the following chemotherapy and/or immunotherapy agents: carfilzomib      To help prevent nausea and vomiting after your treatment, we encourage you to take your nausea medication as directed.  BELOW ARE SYMPTOMS THAT SHOULD BE REPORTED IMMEDIATELY: *FEVER GREATER THAN 100.4 F (38 C) OR HIGHER *CHILLS OR SWEATING *NAUSEA AND VOMITING THAT IS NOT CONTROLLED WITH YOUR NAUSEA MEDICATION *UNUSUAL SHORTNESS OF BREATH *UNUSUAL BRUISING OR BLEEDING *URINARY PROBLEMS (pain or burning when urinating, or frequent urination) *BOWEL PROBLEMS (unusual diarrhea, constipation, pain near the anus) TENDERNESS IN MOUTH AND THROAT WITH OR WITHOUT PRESENCE OF ULCERS (sore throat, sores in mouth, or a toothache) UNUSUAL RASH, SWELLING OR PAIN  UNUSUAL VAGINAL DISCHARGE OR ITCHING   Items with * indicate a potential emergency and should be followed up as soon as possible or go to the Emergency Department if any problems should occur.  Please show the CHEMOTHERAPY ALERT CARD or  IMMUNOTHERAPY ALERT CARD at check-in to the Emergency Department and triage nurse.  Should you have questions after your visit or need to cancel or reschedule your appointment, please contact [facility] - A DEPT OF [facility]  Dept: [phone]  and follow the prompts.  Office hours are 8:00 a.m. to 4:30 p.m. Monday - Friday. Please note that voicemails left after 4:00 p.m. may not be returned until the following business day.  We are closed weekends and major holidays. You have access to a nurse at all times for urgent questions. Please call the main number to the clinic Dept: [phone] and follow the prompts.   For any non-urgent questions, you may also contact your provider using MyChart. We now offer e-Visits for anyone 73 and older to request care online for non-urgent symptoms. For details visit [email or web address].   Also download the MyChart app! Go to the app store, search "MyChart", open the app, select [place], and log in with your MyChart username and password.

## 2024-05-03 ENCOUNTER — Encounter: Payer: Self-pay | Admitting: Hematology and Oncology

## 2024-05-05 LAB — KAPPA/LAMBDA LIGHT CHAINS
Kappa free light chain: 7.2 mg/L (ref 3.3–19.4)
Kappa, lambda light chain ratio: 1.6 (ref 0.26–1.65)
Lambda free light chains: 4.5 mg/L — ABNORMAL LOW (ref 5.7–26.3)

## 2024-05-06 ENCOUNTER — Encounter: Payer: Self-pay | Admitting: Hematology and Oncology

## 2024-05-08 LAB — MULTIPLE MYELOMA PANEL, SERUM
Albumin SerPl Elph-Mcnc: 3.1 g/dL (ref 2.9–4.4)
Albumin/Glob SerPl: 1.3 (ref 0.7–1.7)
Alpha 1: 0.3 g/dL (ref 0.0–0.4)
Alpha2 Glob SerPl Elph-Mcnc: 0.8 g/dL (ref 0.4–1.0)
B-Globulin SerPl Elph-Mcnc: 1 g/dL (ref 0.7–1.3)
Gamma Glob SerPl Elph-Mcnc: 0.4 g/dL (ref 0.4–1.8)
Globulin, Total: 2.5 g/dL (ref 2.2–3.9)
IgA: 38 mg/dL — ABNORMAL LOW (ref 61–437)
IgG (Immunoglobin G), Serum: 455 mg/dL — ABNORMAL LOW (ref 603–1613)
IgM (Immunoglobulin M), Srm: 13 mg/dL — ABNORMAL LOW (ref 20–172)
M Protein SerPl Elph-Mcnc: 0.1 g/dL — ABNORMAL HIGH
Total Protein ELP: 5.6 g/dL — ABNORMAL LOW (ref 6.0–8.5)

## 2024-05-08 MED FILL — Dexamethasone Sodium Phosphate Inj 100 MG/10ML: INTRAMUSCULAR | Qty: 4 | Status: AC

## 2024-05-09 ENCOUNTER — Inpatient Hospital Stay: Payer: Medicare (Managed Care)

## 2024-05-09 ENCOUNTER — Encounter: Payer: Self-pay | Admitting: Hematology and Oncology

## 2024-05-09 VITALS — BP 136/70 | HR 73 | Temp 98.4°F | Resp 16

## 2024-05-09 DIAGNOSIS — C9002 Multiple myeloma in relapse: Secondary | ICD-10-CM

## 2024-05-09 DIAGNOSIS — Z5111 Encounter for antineoplastic chemotherapy: Secondary | ICD-10-CM | POA: Diagnosis not present

## 2024-05-09 LAB — CBC WITH DIFFERENTIAL (CANCER CENTER ONLY)
Abs Immature Granulocytes: 0.04 K/uL (ref 0.00–0.07)
Basophils Absolute: 0 K/uL (ref 0.0–0.1)
Basophils Relative: 0 %
Eosinophils Absolute: 0.3 K/uL (ref 0.0–0.5)
Eosinophils Relative: 4 %
HCT: 34.9 % — ABNORMAL LOW (ref 39.0–52.0)
Hemoglobin: 11.8 g/dL — ABNORMAL LOW (ref 13.0–17.0)
Immature Granulocytes: 1 %
Lymphocytes Relative: 12 %
Lymphs Abs: 0.9 K/uL (ref 0.7–4.0)
MCH: 27.1 pg (ref 26.0–34.0)
MCHC: 33.8 g/dL (ref 30.0–36.0)
MCV: 80.2 fL (ref 80.0–100.0)
Monocytes Absolute: 1 K/uL (ref 0.1–1.0)
Monocytes Relative: 14 %
Neutro Abs: 5.1 K/uL (ref 1.7–7.7)
Neutrophils Relative %: 69 %
Platelet Count: 176 K/uL (ref 150–400)
RBC: 4.35 MIL/uL (ref 4.22–5.81)
RDW: 19.5 % — ABNORMAL HIGH (ref 11.5–15.5)
WBC Count: 7.3 K/uL (ref 4.0–10.5)
nRBC: 0 % (ref 0.0–0.2)

## 2024-05-09 LAB — CMP (CANCER CENTER ONLY)
ALT: 14 U/L (ref 0–44)
AST: 11 U/L — ABNORMAL LOW (ref 15–41)
Albumin: 3.9 g/dL (ref 3.5–5.0)
Alkaline Phosphatase: 92 U/L (ref 38–126)
Anion gap: 4 — ABNORMAL LOW (ref 5–15)
BUN: 16 mg/dL (ref 8–23)
CO2: 26 mmol/L (ref 22–32)
Calcium: 9.9 mg/dL (ref 8.9–10.3)
Chloride: 116 mmol/L — ABNORMAL HIGH (ref 98–111)
Creatinine: 0.74 mg/dL (ref 0.61–1.24)
GFR, Estimated: >60 mL/min (ref >60)
Glucose, Bld: 97 mg/dL (ref 70–99)
Potassium: 4.5 mmol/L (ref 3.5–5.1)
Sodium: 146 mmol/L — ABNORMAL HIGH (ref 135–145)
Total Bilirubin: 0.8 mg/dL (ref 0.0–1.2)
Total Protein: 6.6 g/dL (ref 6.5–8.1)

## 2024-05-09 MED ORDER — SODIUM CHLORIDE 0.9 % IV SOLN
40.0000 mg | Freq: Once | INTRAVENOUS | Status: AC
  Administered 2024-05-09: 40 mg via INTRAVENOUS
  Filled 2024-05-09: qty 4

## 2024-05-09 MED ORDER — SODIUM CHLORIDE 0.9 % IV SOLN: Freq: Once | INTRAVENOUS | Status: AC

## 2024-05-09 MED ORDER — DEXTROSE 5 % IV SOLN
70.0000 mg/m2 | Freq: Once | INTRAVENOUS | Status: AC
  Administered 2024-05-09: 150 mg via INTRAVENOUS
  Filled 2024-05-09: qty 60

## 2024-05-09 NOTE — Patient Instructions (Addendum)
 [facility] - A DEPT OF [facility]   Discharge Instructions: Thank you for choosing [facility] to provide your oncology and hematology care.   If you have a lab appointment with the Cancer Center, please go directly to the Cancer Center and check in at the registration area.   Wear comfortable clothing and clothing appropriate for easy access to any Portacath or PICC line.   We strive to give you quality time with your provider. You may need to reschedule your appointment if you arrive late (15 or more minutes).  Arriving late affects you and other patients whose appointments are after yours.  Also, if you miss three or more appointments without notifying the office, you may be dismissed from the clinic at the provider's discretion.      For prescription refill requests, have your pharmacy contact our office and allow 72 hours for refills to be completed.    Today you received the following chemotherapy and/or immunotherapy agents: Carfilzomib (Kyprolis)      To help prevent nausea and vomiting after your treatment, we encourage you to take your nausea medication as directed.  BELOW ARE SYMPTOMS THAT SHOULD BE REPORTED IMMEDIATELY: *FEVER GREATER THAN 100.4 F (38 C) OR HIGHER *CHILLS OR SWEATING *NAUSEA AND VOMITING THAT IS NOT CONTROLLED WITH YOUR NAUSEA MEDICATION *UNUSUAL SHORTNESS OF BREATH *UNUSUAL BRUISING OR BLEEDING *URINARY PROBLEMS (pain or burning when urinating, or frequent urination) *BOWEL PROBLEMS (unusual diarrhea, constipation, pain near the anus) TENDERNESS IN MOUTH AND THROAT WITH OR WITHOUT PRESENCE OF ULCERS (sore throat, sores in mouth, or a toothache) UNUSUAL RASH, SWELLING OR PAIN  UNUSUAL VAGINAL DISCHARGE OR ITCHING   Items with * indicate a potential emergency and should be followed up as soon as possible or go to the Emergency Department if any problems should occur.  Please show the CHEMOTHERAPY ALERT CARD or  IMMUNOTHERAPY ALERT CARD at check-in to the Emergency Department and triage nurse.  Should you have questions after your visit or need to cancel or reschedule your appointment, please contact [facility] - A DE[facility]  Dept: [phone]  and follow the prompts.  Office hours are 8:00 a.m. to 4:30 p.m. Monday - Friday. Please note that voicemails left after 4:00 p.m. may not be returned until the following business day.  We are closed weekends and major holidays. You have access to a nurse at all times for urgent questions. Please call the main number to the clinic Dept: [phone] and follow the prompts.   For any non-urgent questions, you may also contact your provider using MyChart. We now offer e-Visits for anyone 55 and older to request care online for non-urgent symptoms. For details visit [email or web address].   Also download the MyChart app! Go to the app store, search "MyChart", open the app, select [place], and log in with your MyChart username and password.

## 2024-05-13 ENCOUNTER — Encounter: Payer: Self-pay | Admitting: Hematology and Oncology

## 2024-05-15 MED FILL — Dexamethasone Sodium Phosphate Inj 100 MG/10ML: INTRAMUSCULAR | Qty: 4 | Status: AC

## 2024-05-16 ENCOUNTER — Inpatient Hospital Stay: Payer: Medicare (Managed Care)

## 2024-05-16 ENCOUNTER — Inpatient Hospital Stay (HOSPITAL_BASED_OUTPATIENT_CLINIC_OR_DEPARTMENT_OTHER): Payer: Medicare (Managed Care) | Admitting: Hematology and Oncology

## 2024-05-16 ENCOUNTER — Encounter: Payer: Self-pay | Admitting: Hematology and Oncology

## 2024-05-16 VITALS — BP 170/80 | HR 66 | Temp 97.3°F | Resp 13 | Wt 248.9 lb

## 2024-05-16 VITALS — BP 139/76

## 2024-05-16 DIAGNOSIS — C9002 Multiple myeloma in relapse: Secondary | ICD-10-CM

## 2024-05-16 DIAGNOSIS — Z5111 Encounter for antineoplastic chemotherapy: Secondary | ICD-10-CM

## 2024-05-16 DIAGNOSIS — D649 Anemia, unspecified: Secondary | ICD-10-CM

## 2024-05-16 DIAGNOSIS — Z7901 Long term (current) use of anticoagulants: Secondary | ICD-10-CM

## 2024-05-16 DIAGNOSIS — Z86718 Personal history of other venous thrombosis and embolism: Secondary | ICD-10-CM

## 2024-05-16 DIAGNOSIS — Z95828 Presence of other vascular implants and grafts: Secondary | ICD-10-CM

## 2024-05-16 DIAGNOSIS — Z8619 Personal history of other infectious and parasitic diseases: Secondary | ICD-10-CM

## 2024-05-16 LAB — CMP (CANCER CENTER ONLY)
ALT: 26 U/L (ref 0–44)
AST: 13 U/L — ABNORMAL LOW (ref 15–41)
Albumin: 3.6 g/dL (ref 3.5–5.0)
Alkaline Phosphatase: 92 U/L (ref 38–126)
Anion gap: 4 — ABNORMAL LOW (ref 5–15)
BUN: 11 mg/dL (ref 8–23)
CO2: 27 mmol/L (ref 22–32)
Calcium: 9.3 mg/dL (ref 8.9–10.3)
Chloride: 112 mmol/L — ABNORMAL HIGH (ref 98–111)
Creatinine: 0.72 mg/dL (ref 0.61–1.24)
GFR, Estimated: >60 mL/min (ref >60)
Glucose, Bld: 123 mg/dL — ABNORMAL HIGH (ref 70–99)
Potassium: 3.7 mmol/L (ref 3.5–5.1)
Sodium: 143 mmol/L (ref 135–145)
Total Bilirubin: 0.7 mg/dL (ref 0.0–1.2)
Total Protein: 5.8 g/dL — ABNORMAL LOW (ref 6.5–8.1)

## 2024-05-16 LAB — CBC WITH DIFFERENTIAL (CANCER CENTER ONLY)
Abs Immature Granulocytes: 0.04 K/uL (ref 0.00–0.07)
Basophils Absolute: 0 K/uL (ref 0.0–0.1)
Basophils Relative: 0 %
Eosinophils Absolute: 0.2 K/uL (ref 0.0–0.5)
Eosinophils Relative: 3 %
HCT: 31.5 % — ABNORMAL LOW (ref 39.0–52.0)
Hemoglobin: 10.9 g/dL — ABNORMAL LOW (ref 13.0–17.0)
Immature Granulocytes: 1 %
Lymphocytes Relative: 10 %
Lymphs Abs: 0.8 K/uL (ref 0.7–4.0)
MCH: 27.4 pg (ref 26.0–34.0)
MCHC: 34.6 g/dL (ref 30.0–36.0)
MCV: 79.1 fL — ABNORMAL LOW (ref 80.0–100.0)
Monocytes Absolute: 1 K/uL (ref 0.1–1.0)
Monocytes Relative: 13 %
Neutro Abs: 5.3 K/uL (ref 1.7–7.7)
Neutrophils Relative %: 73 %
Platelet Count: 151 K/uL (ref 150–400)
RBC: 3.98 MIL/uL — ABNORMAL LOW (ref 4.22–5.81)
RDW: 19 % — ABNORMAL HIGH (ref 11.5–15.5)
WBC Count: 7.3 K/uL (ref 4.0–10.5)
nRBC: 0 % (ref 0.0–0.2)

## 2024-05-16 MED ORDER — DEXTROSE 5 % IV SOLN
70.0000 mg/m2 | Freq: Once | INTRAVENOUS | Status: AC
  Administered 2024-05-16: 150 mg via INTRAVENOUS
  Filled 2024-05-16: qty 60

## 2024-05-16 MED ORDER — SODIUM CHLORIDE 0.9 % IV SOLN: Freq: Once | INTRAVENOUS | Status: AC

## 2024-05-16 MED ORDER — SODIUM CHLORIDE 0.9 % IV SOLN
40.0000 mg | Freq: Once | INTRAVENOUS | Status: AC
  Administered 2024-05-16: 40 mg via INTRAVENOUS
  Filled 2024-05-16: qty 4

## 2024-05-16 NOTE — Progress Notes (Signed)
 [facility] [phone]   [phone]  PROGRESS NOTE  Patient Care Team: Cloria Annabella CROME, DO as PCP - General (Geriatric Medicine) Babs Arthea DASEN, MD as Consulting Physician (Physical Medicine and Rehabilitation) Pegge Toribio PARAS, PA-C as Physician Assistant (Physical Medicine and Rehabilitation) Federico Norleen DASEN MADISON, MD as Consulting Physician (Hematology and Oncology) Federico Norleen DASEN MADISON, MD as Consulting Physician (Hematology and Oncology) Federico Norleen DASEN MADISON, MD as Consulting Physician (Hematology and Oncology)  Hematological/Oncological History # IgG Lambda Multiple Myeloma, Relapsed (ISS Stage II) 1) 06/2010: initial diagnosis of Multiple Myeloma after T8 compression fracture. Treated with Velcade /Revlimid /Dexamethasone  and achieved a complete remission 2) Velcade  was discontinued in September 2012 and that Revlimid  and Decadron  were discontinued in March 2013. 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 4) Followed by Dr. Lonn, last clinic visit 10/09/2019. At that time there was concern for relapse of his multiple myeloma.  5) Patient requested transfer to different provider after misunderstanding regarding imaging studies 6) 12/17/2019: transfer care to Dr. Federico  7) 01/09/2020: Cycle 1 Day 1 of Dara/Velcade /Dex 8) 01/21/2020: presented as urgent visit for diarrhea and dehydration. Holding chemotherapy scheduled for 01/23/2020. 9) 01/30/2020: Resume dara/velcade /dex after resolution of diarrhea.  10) 02/13/2020: restaging labs show M protein 0.8, Kappa 4.5, lamba 17.2, ratio 0.26, urine M protein 53 (7.1%). All MM labs indicate improvement.  11) 03/10/2020: Cycle 4 Day 1 of Dara/Velcade /Dex. Transition to q 3 week daratumumab .  12) 06/04/2020:  Cycle 8 Day 1 of Dara/Velcade /Dex 13) 06/25/2020:  Cycle 9 Day 1 of Dara/Velcade /Dex 14) 07/22/2020: Cycle 10 Day  1 of  Dara//Dex 15) 08/18/2020: Cycle 11 Day 1 of Dara//Dex 16) 09/23/2020: Cycle 12 Day 1 of Dara//Dex 17) 10/22/2020: Cycle 13 Day 1 of Dara/Dex  18) 11/19/2020: Cycle 14 Day 1 of Dara/Dex  19) 12/17/2020: Cycle 15 Day 1 of Dara/Dex  20) 01/17/2021: Cycle 16 Day 1 of Dara/Dex  21) 02/11/2021: Cycle 17 Day 1 of Dara/Dex 22) 03/10/2021: Cycle 18 Day 1 of Dara/Dex  23) 04/08/2021: Cycle 19 Day 1 of Dara/Dex  24) 08/03/2021: Cycle 20 Day 1 of Dara/Dex (delayed due to scheduling error) 25) 09/02/2021: Cycle 21 Day 1 of Dara/Dex 26) 09/30/2021: Cycle 22 Day 1 of Dara/Dex 27) 10/28/2021: Cycle 23 Day 1 of Dara/Dex 28) 12/02/2021: Cycle 24 Day 1 of Dara/Dex 29) 12/30/2021: Cycle 25 Day 1 of Dara/Dex 30) 01/06/2022: Cycle 1 Day 1 of Kyprolis /Dex 31) 02/03/2022: Cycle 2 Day 1 of Kyprolis /Dex 32) 02/24/2022: Cycle 3 Day 1 of Kyprolis /Dex 33) 04/07/2022: Cycle 4 Day 1 of Kyprolis /Dex 34) 05/05/2022: Cycle 5 Day 1 of Kyprolis /Dex 35) 06/01/2022: Cycle 6 Day 1 of Kyprolis /Dex 36) 06/29/2022: Cycle 7 Day 1 of Kyprolis /Dex 37) 07/28/2022: Cycle 8 Day 1 of Kyprolis /Dex 38) 08/25/2022: Cycle 9 Day 1 of Kyprolis /Dex 39) 09/22/2022: Cycle 10 Day 1 of Kyprolis /Dex 40) 10/20/2022: Cycle 11 Day 1 of Kyprolis /Dex 41) 11/17/2022: Cycle 12 Day 1 of Kyprolis /Dex 42 ) 12/22/2022-01/08/2023: Admitted for fournier's gangrene of scrotum.  43) 01/26/2023: Cycle 13 Day 1 of Kyprolis /Dex 44) 02/16/2023: Cycle 14 Day 1 of Kyprolis /Dex 45) 03/16/2023:Cycle 15 Day 1 of Kyprolis /Dex 46) 04/13/2023: Cycle 16 Day 1 of Kyprolis /Dex 47) 05/11/2023: Cycle 17 Day 1 of Kyprolis /Dex 48) 06/07/2023: Cycle 18 Day 1 of Kyprolis /Dex 49) 07/06/2023: Cycle 19 Day 1 of Kyprolis /Dex 50) 08/02/2023: Cycle 20 Day 1 of Kyprolis /Dex 51) 08/31/2023: Cycle 21 Day 1 of  Kyprolis /Dex 52) 09/28/2023: Cycle 22 Day 1 of Kyprolis /Dex 53) 10/26/2023: Cycle 23 Day 1 of Kyprolis /Dex 54) 11/23/2023: Cycle 24 Day 1 of Kyprolis /Dex 55) 12/21/2023: Cycle 25 Day 1 of Kyprolis /Dex 56)  01/18/2024: Cycle 26 Day 1 of Kyprolis /Dex 57) 02/08/2024: Cycle 27 Day 1 of Kyprolis /Dex 58) 03/07/2024: Cycle 28 Day 1 of Kyprolis /Dex 59) 04/04/2024: Cycle 29 Day 1 of Kyprolis /Dex 60) 05/02/2024: Cycle 30 Day 1 Kyprolis /Dex  Interval History:  [name] 68 y.o. male with medical history significant for  IgG Lambda Multiple Myeloma who presents for a follow up visit. The patient's last visit was on 05/02/2024.  He presents today prior to treatment with Kyprolis /Dex. He is unaccompanied for this visit.   On exam today [name] reports he has been well overall in the interim since our last visit.  He reports he is surprised that he is gained 8 pounds according to our scales.  He reports that he has had no issues with runny nose, sore throat, cough and has received his flu shot but not yet his COVID shot.  He is tolerating his chemotherapy treatment well with no nausea, vomiting, or diarrhea.  He did have an episode of loose stools when he combined apple juice with coffee.  He notes that he otherwise has been at his baseline level of health with no fevers, chills, sweats.  He is willing and able to continue on Kyprolis  therapy at this time.  He denies any numbness or tingling in his fingers.  A full 10 point ROS otherwise negative.   MEDICAL HISTORY:  Past Medical History:  Diagnosis Date   Adrenal insufficiency    on chronic dexamethasone    Anemia    Cancer (HCC)    Coagulopathy    on xeralto/ s/p DVT while on coumadin,  IVC in place   Diabetes mellitus without complication (HCC)    type 2   Glaucoma 12/22/2022   Gross hematuria 01/2013   post foley cath procedure   History of blood transfusion 01/2013   Lower GI bleeding 04/18/2017   Multiple myeloma    thoracic T8 with paraplegia s/p resection- on chemo at visit 10/13/10   Multiple myeloma    Multiple myeloma without mention of remission    Neurogenic bladder    Neurogenic bowel    OSA (obstructive sleep apnea) 11/01/2022    Paraplegia (HCC)    Partial small bowel obstruction (HCC) during dec 2011 admission    SURGICAL HISTORY: Past Surgical History:  Procedure Laterality Date   COLONOSCOPY WITH PROPOFOL  N/A 04/12/2017   Procedure: COLONOSCOPY WITH PROPOFOL ;  Surgeon: Abran Norleen SAILOR, MD;  Location: [facility];  Service: Endoscopy;  Laterality: N/A;   COLONOSCOPY WITH PROPOFOL  N/A 04/19/2017   Procedure: COLONOSCOPY WITH PROPOFOL ;  Surgeon: Leigh Elspeth SQUIBB, MD;  Location: [facility];  Service: Gastroenterology;  Laterality: N/A;   COLOSTOMY  07/20/2011   Procedure: COLOSTOMY;  Surgeon: Redell Alm Faith, DO;  Location: [facility] OR;  Service: General;;   COLOSTOMY REVISION  07/20/2011   Procedure: COLON RESECTION SIGMOID;  Surgeon: Redell Alm Faith, DO;  Location: [facility] OR;  Service: General;;   CYSTOSCOPY N/A 04/04/2013   Procedure: CYSTOSCOPY WITH LITHALOPAXY;  Surgeon: Ricardo Likens, MD;  Location: [facility];  Service: Urology;  Laterality: N/A;   INCISION AND DRAINAGE ABSCESS N/A 12/25/2022   Procedure: INCISION AND DRAINAGE OF SCROTUM;  Surgeon: Lovie Arlyss CROME, MD;  Location: [facility];  Service: Urology;  Laterality: N/A;   INSERTION OF SUPRAPUBIC  CATHETER N/A 04/04/2013   Procedure: INSERTION OF SUPRAPUBIC CATHETER;  Surgeon: Ricardo Likens, MD;  Location: [facility];  Service: Urology;  Laterality: N/A;   LAPAROTOMY  07/20/2011   Procedure: EXPLORATORY LAPAROTOMY;  Surgeon: Redell Alm Faith, DO;  Location: [facility] OR;  Service: General;  Laterality: N/A;   myeloma thoracic T8 with parpaplegia s/p thoracotomy and thoracic T7-9 cage placement on Dec 26th 2011  07/18/10   SCROTAL EXPLORATION N/A 12/29/2022   Procedure: SCROTAL WOUND DEBRIDEMENT AND CLOSURE;  Surgeon: Likens Ricardo KATHEE Mickey., MD;  Location: [facility];  Service: Urology;  Laterality: N/A;    SOCIAL HISTORY: Social History   Socioeconomic History   Marital status: Married    Spouse name: Not on file   Number of children: Not on file   Years of  education: Not on file   Highest education level: Not on file  Occupational History   Not on file  Tobacco Use   Smoking status: Never   Smokeless tobacco: Never  Vaping Use   Vaping status: Never Used  Substance and Sexual Activity   Alcohol use: No   Drug use: No   Sexual activity: Never  Other Topics Concern   Not on file  Social History Narrative   Not on file   Social Drivers of Health   Financial Resource Strain: Not on file  Food Insecurity: No Food Insecurity (12/22/2022)   Hunger Vital Sign    Worried About Running Out of Food in the Last Year: Never true    Ran Out of Food in the Last Year: Never true  Transportation Needs: No Transportation Needs (12/22/2022)   PRAPARE - Administrator, Civil Service (Medical): No    Lack of Transportation (Non-Medical): No  Physical Activity: Not on file  Stress: Not on file  Social Connections: Not on file  Intimate Partner Violence: Not At Risk (12/22/2022)   Humiliation, Afraid, Rape, and Kick questionnaire    Fear of Current or Ex-Partner: No    Emotionally Abused: No    Physically Abused: No    Sexually Abused: No    FAMILY HISTORY: Family History  Problem Relation Age of Onset   Ovarian cancer Mother    Diabetes Father     ALLERGIES:  is allergic to ferumoxytol .  MEDICATIONS:  Current Outpatient Medications  Medication Sig Dispense Refill   acetaminophen  (TYLENOL ) 325 MG tablet Take 325-650 mg by mouth every 6 (six) hours as needed for headache or mild pain.     acetic acid 0.25 % irrigation Irrigate with 1 Application as directed See admin instructions. Instill 50 ml's, clamp for 10 minutes, then remove clamp and drain the bladder. Repeat 3 times a week- when not using the Renacidin irrigation     acyclovir  (ZOVIRAX ) 400 MG tablet Take 1 tablet (400 mg total) by mouth 2 (two) times daily. 60 tablet 2   albuterol  (VENTOLIN  HFA) 108 (90 Base) MCG/ACT inhaler Inhale 2 puffs into the lungs every 4 (four)  hours as needed for wheezing or shortness of breath.     atorvastatin  (LIPITOR) 20 MG tablet Take 20 mg by mouth daily.     BENADRYL  ALLERGY 25 MG tablet Take 25 mg by mouth every 6 (six) hours as needed (for allergic reactions).     BIOFREEZE 4 % GEL Apply 1 application  topically every 4 (four) hours as needed (for joint pain- to intact areas of the skin only).     Citric Ac-Gluconolact-Mg Carb (RENACIDIN IR)  Irrigate with 30 mLs as directed See admin instructions. Instill 30 ml's, clamp for 10 minutes, then remove clamp and drain the bladder. Repeat 3 times a week- when not using the acetic acid irrigation     COSOPT  PF 2-0.5 % SOLN Place 1 drop into both eyes 2 (two) times daily.     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     Emollient (EUCERIN EX) Apply 1 application  topically See admin instructions. Apply to affected area 2 times a day     FLOVENT HFA 110 MCG/ACT inhaler 2 each See admin instructions. 2 sprays to colostomy stoma prior to each colostomy bag change     guaifenesin  (ROBITUSSIN) 100 MG/5ML syrup Take 200 mg by mouth 4 (four) times daily as needed for cough.     iron  polysaccharides (NU-IRON ) 150 MG capsule Take 1 capsule (150 mg total) by mouth daily. 30 capsule 0   latanoprost  (XALATAN ) 0.005 % ophthalmic solution Place 1 drop into both eyes at bedtime.     Multiple Vitamin (MULTIVITAMIN WITH MINERALS) TABS tablet Take 1 tablet by mouth daily. 30 tablet 0   NON FORMULARY Apply 1 application  topically See admin instructions. Colo Plast paste- Apply as directed with each ostomy bag change     ondansetron  (ZOFRAN ) 4 MG tablet Take 1 tablet (4 mg) by mouth every 6 hours as needed for nausea. 20 tablet 0   rivaroxaban  (XARELTO ) 10 MG TABS tablet Take 1 tablet (10 mg total) by mouth daily with supper. (Patient taking differently: Take 10 mg by mouth daily.) 30 tablet 9   Skin Protectants, Misc.  (MINERIN CREME) CREA Apply to affected areas 2 times daily as needed. 454 g 0   Zinc  Oxide (BALMEX EX) Apply 1 application  topically See admin instructions. Apply topically daily to affected sites     No current facility-administered medications for this visit.   Facility-Administered Medications Ordered in Other Visits  Medication Dose Route Frequency Provider Last Rate Last Admin   sodium chloride  flush (NS) 0.9 % injection 10 mL  10 mL Intravenous PRN Lonn, Ni, MD        REVIEW OF SYSTEMS:   Constitutional: ( - ) fevers, ( - )  chills , ( - ) night sweats Eyes: ( - ) blurriness of vision, ( - ) double vision, ( - ) watery eyes Ears, nose, mouth, throat, and face: ( - ) mucositis, ( - ) sore throat Respiratory: ( - ) cough, ( - ) dyspnea, ( - ) wheezes Cardiovascular: ( - ) palpitation, ( - ) chest discomfort, ( - ) lower extremity swelling Gastrointestinal:  ( - ) nausea, ( - ) heartburn, ( - ) change in bowel habits Skin: ( - ) abnormal skin rashes Lymphatics: ( - ) new lymphadenopathy, ( - ) easy bruising Neurological: ( - ) numbness, ( - ) tingling, ( - ) new weaknesses Behavioral/Psych: ( - ) mood change, ( - ) new changes  All other systems were reviewed with the patient and are negative.  PHYSICAL EXAMINATION: ECOG PERFORMANCE STATUS: paraplegic.   Vitals:   05/16/24 1121  BP: (!) 170/80  Pulse: 66  Resp: 13  Temp: (!) 97.3 F (36.3 C)  SpO2: 100%      Filed Weights   05/16/24 1121  Weight: 248 lb 14.4 oz (112.9 kg)       GENERAL: well  appearing middle aged [place] American male alert, no distress and comfortable SKIN: skin color, texture, turgor are normal, no rashes or significant lesions EYES: conjunctiva are pink and non-injected, sclera clear LUNGS: clear to auscultation and percussion with normal breathing effort HEART: regular rate & rhythm and no murmurs Musculoskeletal: no cyanosis of digits and no clubbing  PSYCH: alert & oriented x 3, fluent  speech NEURO: paraplegic, no use of LE bilaterally.   LABORATORY DATA:  I have reviewed the data as listed    Latest Ref Rng & Units 05/16/2024   10:48 AM 05/09/2024    9:52 AM 05/02/2024    9:41 AM  CBC  WBC 4.0 - 10.5 K/uL 7.3  7.3  6.5   Hemoglobin 13.0 - 17.0 g/dL 89.0  88.1  88.4   Hematocrit 39.0 - 52.0 % 31.5  34.9  34.0   Platelets 150 - 400 K/uL 151  176  344        Latest Ref Rng & Units 05/16/2024   10:48 AM 05/09/2024    9:52 AM 05/02/2024    9:41 AM  CMP  Glucose 70 - 99 mg/dL 876  97  93   BUN 8 - 23 mg/dL 11  16  9    Creatinine 0.61 - 1.24 mg/dL 9.27  9.25  9.32   Sodium 135 - 145 mmol/L 143  146  143   Potassium 3.5 - 5.1 mmol/L 3.7  4.5  3.9   Chloride 98 - 111 mmol/L 112  116  110   CO2 22 - 32 mmol/L 27  26  28    Calcium  8.9 - 10.3 mg/dL 9.3  9.9  9.8   Total Protein 6.5 - 8.1 g/dL 5.8  6.6  6.4   Total Bilirubin 0.0 - 1.2 mg/dL 0.7  0.8  0.8   Alkaline Phos 38 - 126 U/L 92  92  92   AST 15 - 41 U/L 13  11  14    ALT 0 - 44 U/L 26  14  15      Lab Results  Component Value Date   MPROTEIN 0.1 (H) 05/02/2024   MPROTEIN 0.1 (H) 04/04/2024   MPROTEIN 0.1 (H) 03/07/2024   Lab Results  Component Value Date   KPAFRELGTCHN 7.2 05/02/2024   KPAFRELGTCHN 6.5 04/04/2024   KPAFRELGTCHN 6.9 03/07/2024   LAMBDASER 4.5 (L) 05/02/2024   LAMBDASER 4.2 (L) 04/04/2024   LAMBDASER 4.5 (L) 03/07/2024   KAPLAMBRATIO 1.60 05/02/2024   KAPLAMBRATIO 1.55 04/04/2024   KAPLAMBRATIO 1.53 03/07/2024    RADIOGRAPHIC STUDIES: No results found.   ASSESSMENT & PLAN [name] is a 68 y.o.  male with medical history significant for  IgG Lambda Multiple Myeloma who presents for a follow up visit.  # IgG Lambda Multiple Myeloma, Relapsed (ISS Stage II) --findings are most consistent with relapsed multiple myeloma. Patient previously successfully treated with Velcade /Rev/Dex and Daratumumab /Velcade /Dex. On 07/02/2020 he transitioned to monthly daratumumab  alone.   --due to to rise in M protein, switched to Kyprolis  and Dexamethasone  on 01/06/2022 Plan: --Due for Cycle 30, Day 15 of Kyprolis /Dex today  --Labs show WBC 7.3, Hgb 10.9, MCV 79.1, Plt 151, creatinine and calcium  levels are normal. --Most recent myeloma labs from 05/02/2024 shows M protein stable at 0.1 g/dL and kappa light chain 7.2, Lambda 4.5, Ratio 1.60 --No clinical evidence of relapse at this time. --Proceed with treatment today without any dose modifications. --Continue with weekly labs plus Kyprolis /Dex treatment as scheduled  #Anemia: --Likely secondary  to MM +/- iron  deficiency --Currently on PO iron  therapy daily --Will recheck iron  panel periodically.   #History of Sepsis 2/2 UTI and Fournier's gangrene of scrotum: --Hospitalized from 12/22/2022-01/08/2023. Underwent debridement of necrotic tissue. --Given PIP tazobactam and Zyvox  initially --Urine culture reveals Proteus mirabilis which is resistant to ciprofloxacin  and nitrofurantoin --Blood cultures negative --D/C on remainder doxycycline  and Augmentin  which he completed on 01/07/2023.  --Evaluated by urology on 7/2 and ID on 7/3 with continued wound healing and no further d/c or evidence of infection.   #History of DVT --He had placement of IVC filter, remains on Xarelto . --Due to poor mobility, and lack of bleeding complications, recommend to continue on Xarelto  indefinitely. --caution if Plt count were to drop <50  # Supportive Care -- provided patient with an albuterol  inhaler (for use with daratumumab ) --acyclovir  400mg  BID for VZV prophylaxis --zofran  8mg  q8H PRN for nausea/vomiting  --Zometa  is being held in the setting of his prior episode of osteonecrosis of the jaw  No orders of the defined types were placed in this encounter.   All questions were answered. The patient knows to call the clinic with any problems, questions or concerns.  I have spent a total of 30 minutes minutes of face-to-face and  non-face-to-face time, preparing to see the patient,  performing a medically appropriate examination, counseling and educating the patient, communicating with other health care professionals, documenting clinical information in the electronic health record,  and care coordination.   Norleen IVAR Kidney, MD Department of Hematology/Oncology [facility] Phone: [phone] Pager: [phone] Email: [email or web address]   05/16/2024 4:48 PM   Literature Support:  Bringhen S, Mina R, Petrucci MT, Gaidano G, Ballanti S, Musto P, Offidani M, Spada S, Benevolo G, Ponticelli E, Galieni P, Cavo M, Di Toritto TC, Di Raimondo F, Montefusco V, Palumbo A, Boccadoro M, Larocca A. Once-weekly versus twice-weekly carfilzomib  in patients with newly diagnosed multiple myeloma: a pooled analysis of two phase I/II studies. Haematologica. 2019 Aug;104(8):1640-1647.  --Once-weekly 70 mg/m2 carfilzomib  as induction and maintenance therapy for newly diagnosed multiple myeloma patients was as safe and effective as twice-weekly 36 mg/m2 carfilzomib  and provided a more convenient schedule.

## 2024-05-24 ENCOUNTER — Encounter: Payer: Self-pay | Admitting: Hematology and Oncology

## 2024-05-26 ENCOUNTER — Encounter: Payer: Self-pay | Admitting: Radiology

## 2024-05-29 NOTE — Progress Notes (Signed)
 [facility] [phone]   [phone]  PROGRESS NOTE  Patient Care Team: Cloria Annabella CROME, DO as PCP - General (Geriatric Medicine) Babs Arthea DASEN, MD as Consulting Physician (Physical Medicine and Rehabilitation) Pegge Toribio PARAS, PA-C as Physician Assistant (Physical Medicine and Rehabilitation) Federico Norleen DASEN MADISON, MD as Consulting Physician (Hematology and Oncology) Federico Norleen DASEN MADISON, MD as Consulting Physician (Hematology and Oncology) Federico Norleen DASEN MADISON, MD as Consulting Physician (Hematology and Oncology)  Hematological/Oncological History # IgG Lambda Multiple Myeloma, Relapsed (ISS Stage II) 1) 06/2010: initial diagnosis of Multiple Myeloma after T8 compression fracture. Treated with Velcade /Revlimid /Dexamethasone  and achieved a complete remission 2) Velcade  was discontinued in September 2012 and that Revlimid  and Decadron  were discontinued in March 2013. 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 4) Followed by Dr. Lonn, last clinic visit 10/09/2019. At that time there was concern for relapse of his multiple myeloma.  5) Patient requested transfer to different provider after misunderstanding regarding imaging studies 6) 12/17/2019: transfer care to Dr. Federico  7) 01/09/2020: Cycle 1 Day 1 of Dara/Velcade /Dex 8) 01/21/2020: presented as urgent visit for diarrhea and dehydration. Holding chemotherapy scheduled for 01/23/2020. 9) 01/30/2020: Resume dara/velcade /dex after resolution of diarrhea.  10) 02/13/2020: restaging labs show M protein 0.8, Kappa 4.5, lamba 17.2, ratio 0.26, urine M protein 53 (7.1%). All MM labs indicate improvement.  11) 03/10/2020: Cycle 4 Day 1 of Dara/Velcade /Dex. Transition to q 3 week daratumumab .  12) 06/04/2020:  Cycle 8 Day 1 of Dara/Velcade /Dex 13) 06/25/2020:  Cycle 9 Day 1 of Dara/Velcade /Dex 14) 07/22/2020: Cycle 10 Day  1 of  Dara//Dex 15) 08/18/2020: Cycle 11 Day 1 of Dara//Dex 16) 09/23/2020: Cycle 12 Day 1 of Dara//Dex 17) 10/22/2020: Cycle 13 Day 1 of Dara/Dex  18) 11/19/2020: Cycle 14 Day 1 of Dara/Dex  19) 12/17/2020: Cycle 15 Day 1 of Dara/Dex  20) 01/17/2021: Cycle 16 Day 1 of Dara/Dex  21) 02/11/2021: Cycle 17 Day 1 of Dara/Dex 22) 03/10/2021: Cycle 18 Day 1 of Dara/Dex  23) 04/08/2021: Cycle 19 Day 1 of Dara/Dex  24) 08/03/2021: Cycle 20 Day 1 of Dara/Dex (delayed due to scheduling error) 25) 09/02/2021: Cycle 21 Day 1 of Dara/Dex 26) 09/30/2021: Cycle 22 Day 1 of Dara/Dex 27) 10/28/2021: Cycle 23 Day 1 of Dara/Dex 28) 12/02/2021: Cycle 24 Day 1 of Dara/Dex 29) 12/30/2021: Cycle 25 Day 1 of Dara/Dex 30) 01/06/2022: Cycle 1 Day 1 of Kyprolis /Dex 31) 02/03/2022: Cycle 2 Day 1 of Kyprolis /Dex 32) 02/24/2022: Cycle 3 Day 1 of Kyprolis /Dex 33) 04/07/2022: Cycle 4 Day 1 of Kyprolis /Dex 34) 05/05/2022: Cycle 5 Day 1 of Kyprolis /Dex 35) 06/01/2022: Cycle 6 Day 1 of Kyprolis /Dex 36) 06/29/2022: Cycle 7 Day 1 of Kyprolis /Dex 37) 07/28/2022: Cycle 8 Day 1 of Kyprolis /Dex 38) 08/25/2022: Cycle 9 Day 1 of Kyprolis /Dex 39) 09/22/2022: Cycle 10 Day 1 of Kyprolis /Dex 40) 10/20/2022: Cycle 11 Day 1 of Kyprolis /Dex 41) 11/17/2022: Cycle 12 Day 1 of Kyprolis /Dex 42 ) 12/22/2022-01/08/2023: Admitted for fournier's gangrene of scrotum.  43) 01/26/2023: Cycle 13 Day 1 of Kyprolis /Dex 44) 02/16/2023: Cycle 14 Day 1 of Kyprolis /Dex 45) 03/16/2023:Cycle 15 Day 1 of Kyprolis /Dex 46) 04/13/2023: Cycle 16 Day 1 of Kyprolis /Dex 47) 05/11/2023: Cycle 17 Day 1 of Kyprolis /Dex 48) 06/07/2023: Cycle 18 Day 1 of Kyprolis /Dex 49) 07/06/2023: Cycle 19 Day 1 of Kyprolis /Dex 50) 08/02/2023: Cycle 20 Day 1 of Kyprolis /Dex 51) 08/31/2023: Cycle 21 Day 1 of  Kyprolis /Dex 52) 09/28/2023: Cycle 22 Day 1 of Kyprolis /Dex 53) 10/26/2023: Cycle 23 Day 1 of Kyprolis /Dex 54) 11/23/2023: Cycle 24 Day 1 of Kyprolis /Dex 55) 12/21/2023: Cycle 25 Day 1 of Kyprolis /Dex 56)  01/18/2024: Cycle 26 Day 1 of Kyprolis /Dex 57) 02/08/2024: Cycle 27 Day 1 of Kyprolis /Dex 58) 03/07/2024: Cycle 28 Day 1 of Kyprolis /Dex 59) 04/04/2024: Cycle 29 Day 1 of Kyprolis /Dex 60) 05/02/2024: Cycle 30 Day 1 Kyprolis /Dex  Interval History:  [name] 68 y.o. male with medical history significant for  IgG Lambda Multiple Myeloma who presents for a follow up visit. The patient's last visit was on 05/16/2024.  He presents today prior to treatment with Kyprolis /Dex. He is unaccompanied for this visit.   On exam today [name] reports he has been feeling okay in the interim since our last visit.  He reports that he has some recent problems with his wheelchair because there was a screw loose.  He reports he is not happy about his increasing weight and does not want to be 248 pounds.  He reports he would like to be down around 200 pounds.  He reports that he has had no other issues recently with his treatment and denies any nausea, vomiting, or diarrhea.  He reports his energy levels are good and his appetite is strong.  He has not had any nausea or vomiting but did have an episode recently where he had a chicken salad and his ostomy bag exploded.  Otherwise he denies any fevers, chills, sweats.  Full 10 point ROS is otherwise negative.   MEDICAL HISTORY:  Past Medical History:  Diagnosis Date   Adrenal insufficiency    on chronic dexamethasone    Anemia    Cancer (HCC)    Coagulopathy    on xeralto/ s/p DVT while on coumadin,  IVC in place   Diabetes mellitus without complication (HCC)    type 2   Glaucoma 12/22/2022   Gross hematuria 01/2013   post foley cath procedure   History of blood transfusion 01/2013   Lower GI bleeding 04/18/2017   Multiple myeloma    thoracic T8 with paraplegia s/p resection- on chemo at visit 10/13/10   Multiple myeloma    Multiple myeloma without mention of remission    Neurogenic bladder    Neurogenic bowel    OSA (obstructive sleep apnea)  11/01/2022   Paraplegia (HCC)    Partial small bowel obstruction (HCC) during dec 2011 admission    SURGICAL HISTORY: Past Surgical History:  Procedure Laterality Date   COLONOSCOPY WITH PROPOFOL  N/A 04/12/2017   Procedure: COLONOSCOPY WITH PROPOFOL ;  Surgeon: Abran Norleen SAILOR, MD;  Location: [facility];  Service: Endoscopy;  Laterality: N/A;   COLONOSCOPY WITH PROPOFOL  N/A 04/19/2017   Procedure: COLONOSCOPY WITH PROPOFOL ;  Surgeon: Leigh Elspeth SQUIBB, MD;  Location: [facility];  Service: Gastroenterology;  Laterality: N/A;   COLOSTOMY  07/20/2011   Procedure: COLOSTOMY;  Surgeon: Redell Alm Faith, DO;  Location: [facility] OR;  Service: General;;   COLOSTOMY REVISION  07/20/2011   Procedure: COLON RESECTION SIGMOID;  Surgeon: Redell Alm Faith, DO;  Location: [facility];  Service: General;;   CYSTOSCOPY N/A 04/04/2013   Procedure: CYSTOSCOPY WITH LITHALOPAXY;  Surgeon: Ricardo Likens, MD;  Location: [facility];  Service: Urology;  Laterality: N/A;   INCISION AND DRAINAGE ABSCESS N/A 12/25/2022   Procedure: INCISION AND DRAINAGE OF SCROTUM;  Surgeon: Lovie Arlyss CROME, MD;  Location: [facility];  Service: Urology;  Laterality: N/A;   INSERTION OF  SUPRAPUBIC CATHETER N/A 04/04/2013   Procedure: INSERTION OF SUPRAPUBIC CATHETER;  Surgeon: Ricardo Likens, MD;  Location: [facility];  Service: Urology;  Laterality: N/A;   LAPAROTOMY  07/20/2011   Procedure: EXPLORATORY LAPAROTOMY;  Surgeon: Redell Alm Faith, DO;  Location: [facility] OR;  Service: General;  Laterality: N/A;   myeloma thoracic T8 with parpaplegia s/p thoracotomy and thoracic T7-9 cage placement on Dec 26th 2011  07/18/10   SCROTAL EXPLORATION N/A 12/29/2022   Procedure: SCROTAL WOUND DEBRIDEMENT AND CLOSURE;  Surgeon: Likens Ricardo KATHEE Mickey., MD;  Location: [facility];  Service: Urology;  Laterality: N/A;    SOCIAL HISTORY: Social History   Socioeconomic History   Marital status: Married    Spouse name: Not on file   Number of children: Not on file   Years  of education: Not on file   Highest education level: Not on file  Occupational History   Not on file  Tobacco Use   Smoking status: Never   Smokeless tobacco: Never  Vaping Use   Vaping status: Never Used  Substance and Sexual Activity   Alcohol use: No   Drug use: No   Sexual activity: Never  Other Topics Concern   Not on file  Social History Narrative   Not on file   Social Drivers of Health   Financial Resource Strain: Not on file  Food Insecurity: No Food Insecurity (12/22/2022)   Hunger Vital Sign    Worried About Running Out of Food in the Last Year: Never true    Ran Out of Food in the Last Year: Never true  Transportation Needs: No Transportation Needs (12/22/2022)   PRAPARE - Administrator, Civil Service (Medical): No    Lack of Transportation (Non-Medical): No  Physical Activity: Not on file  Stress: Not on file  Social Connections: Not on file  Intimate Partner Violence: Not At Risk (12/22/2022)   Humiliation, Afraid, Rape, and Kick questionnaire    Fear of Current or Ex-Partner: No    Emotionally Abused: No    Physically Abused: No    Sexually Abused: No    FAMILY HISTORY: Family History  Problem Relation Age of Onset   Ovarian cancer Mother    Diabetes Father     ALLERGIES:  is allergic to ferumoxytol .  MEDICATIONS:  Current Outpatient Medications  Medication Sig Dispense Refill   acetaminophen  (TYLENOL ) 325 MG tablet Take 325-650 mg by mouth every 6 (six) hours as needed for headache or mild pain.     acetic acid 0.25 % irrigation Irrigate with 1 Application as directed See admin instructions. Instill 50 ml's, clamp for 10 minutes, then remove clamp and drain the bladder. Repeat 3 times a week- when not using the Renacidin irrigation     acyclovir  (ZOVIRAX ) 400 MG tablet Take 1 tablet (400 mg total) by mouth 2 (two) times daily. 60 tablet 2   albuterol  (VENTOLIN  HFA) 108 (90 Base) MCG/ACT inhaler Inhale 2 puffs into the lungs every 4  (four) hours as needed for wheezing or shortness of breath.     atorvastatin  (LIPITOR) 20 MG tablet Take 20 mg by mouth daily.     BENADRYL  ALLERGY 25 MG tablet Take 25 mg by mouth every 6 (six) hours as needed (for allergic reactions).     BIOFREEZE 4 % GEL Apply 1 application  topically every 4 (four) hours as needed (for joint pain- to intact areas of the skin only).     Citric Ac-Gluconolact-Mg Carb (RENACIDIN  IR) Irrigate with 30 mLs as directed See admin instructions. Instill 30 ml's, clamp for 10 minutes, then remove clamp and drain the bladder. Repeat 3 times a week- when not using the acetic acid irrigation     COSOPT  PF 2-0.5 % SOLN Place 1 drop into both eyes 2 (two) times daily.     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     Emollient (EUCERIN EX) Apply 1 application  topically See admin instructions. Apply to affected area 2 times a day     FLOVENT HFA 110 MCG/ACT inhaler 2 each See admin instructions. 2 sprays to colostomy stoma prior to each colostomy bag change     guaifenesin  (ROBITUSSIN) 100 MG/5ML syrup Take 200 mg by mouth 4 (four) times daily as needed for cough.     iron  polysaccharides (NU-IRON ) 150 MG capsule Take 1 capsule (150 mg total) by mouth daily. 30 capsule 0   latanoprost  (XALATAN ) 0.005 % ophthalmic solution Place 1 drop into both eyes at bedtime.     Multiple Vitamin (MULTIVITAMIN WITH MINERALS) TABS tablet Take 1 tablet by mouth daily. 30 tablet 0   NON FORMULARY Apply 1 application  topically See admin instructions. Colo Plast paste- Apply as directed with each ostomy bag change     ondansetron  (ZOFRAN ) 4 MG tablet Take 1 tablet (4 mg) by mouth every 6 hours as needed for nausea. 20 tablet 0   rivaroxaban  (XARELTO ) 10 MG TABS tablet Take 1 tablet (10 mg total) by mouth daily with supper. (Patient taking differently: Take 10 mg by mouth daily.) 30 tablet 9   Skin Protectants,  Misc. (MINERIN CREME) CREA Apply to affected areas 2 times daily as needed. 454 g 0   Zinc  Oxide (BALMEX EX) Apply 1 application  topically See admin instructions. Apply topically daily to affected sites     No current facility-administered medications for this visit.   Facility-Administered Medications Ordered in Other Visits  Medication Dose Route Frequency Provider Last Rate Last Admin   sodium chloride  flush (NS) 0.9 % injection 10 mL  10 mL Intravenous PRN Lonn, Ni, MD        REVIEW OF SYSTEMS:   Constitutional: ( - ) fevers, ( - )  chills , ( - ) night sweats Eyes: ( - ) blurriness of vision, ( - ) double vision, ( - ) watery eyes Ears, nose, mouth, throat, and face: ( - ) mucositis, ( - ) sore throat Respiratory: ( - ) cough, ( - ) dyspnea, ( - ) wheezes Cardiovascular: ( - ) palpitation, ( - ) chest discomfort, ( - ) lower extremity swelling Gastrointestinal:  ( - ) nausea, ( - ) heartburn, ( - ) change in bowel habits Skin: ( - ) abnormal skin rashes Lymphatics: ( - ) new lymphadenopathy, ( - ) easy bruising Neurological: ( - ) numbness, ( - ) tingling, ( - ) new weaknesses Behavioral/Psych: ( - ) mood change, ( - ) new changes  All other systems were reviewed with the patient and are negative.  PHYSICAL EXAMINATION: ECOG PERFORMANCE STATUS: paraplegic.   Vitals:   05/30/24 1140  BP: 135/76  Pulse: 80  Resp: 16  Temp: (!) 97.5 F (36.4 C)  SpO2: 100%   Filed Weights   05/30/24 1140  Weight: 248 lb (112.5 kg)    GENERAL: well appearing middle aged African American male alert, no  distress and comfortable SKIN: skin color, texture, turgor are normal, no rashes or significant lesions EYES: conjunctiva are pink and non-injected, sclera clear LUNGS: clear to auscultation and percussion with normal breathing effort HEART: regular rate & rhythm and no murmurs Musculoskeletal: no cyanosis of digits and no clubbing  PSYCH: alert & oriented x 3, fluent speech NEURO:  paraplegic, no use of LE bilaterally.   LABORATORY DATA:  I have reviewed the data as listed    Latest Ref Rng & Units 05/30/2024   10:44 AM 05/16/2024   10:48 AM 05/09/2024    9:52 AM  CBC  WBC 4.0 - 10.5 K/uL 6.7  7.3  7.3   Hemoglobin 13.0 - 17.0 g/dL 88.9  89.0  88.1   Hematocrit 39.0 - 52.0 % 32.3  31.5  34.9   Platelets 150 - 400 K/uL 359  151  176        Latest Ref Rng & Units 05/30/2024   10:44 AM 05/16/2024   10:48 AM 05/09/2024    9:52 AM  CMP  Glucose 70 - 99 mg/dL 893  876  97   BUN 8 - 23 mg/dL 11  11  16    Creatinine 0.61 - 1.24 mg/dL 9.21  9.27  9.25   Sodium 135 - 145 mmol/L 143  143  146   Potassium 3.5 - 5.1 mmol/L 3.8  3.7  4.5   Chloride 98 - 111 mmol/L 112  112  116   CO2 22 - 32 mmol/L 25  27  26    Calcium  8.9 - 10.3 mg/dL 9.1  9.3  9.9   Total Protein 6.5 - 8.1 g/dL 6.3  5.8  6.6   Total Bilirubin 0.0 - 1.2 mg/dL 0.7  0.7  0.8   Alkaline Phos 38 - 126 U/L 91  92  92   AST 15 - 41 U/L 15  13  11    ALT 0 - 44 U/L 16  26  14      Lab Results  Component Value Date   MPROTEIN 0.1 (H) 05/02/2024   MPROTEIN 0.1 (H) 04/04/2024   MPROTEIN 0.1 (H) 03/07/2024   Lab Results  Component Value Date   KPAFRELGTCHN 7.2 05/02/2024   KPAFRELGTCHN 6.5 04/04/2024   KPAFRELGTCHN 6.9 03/07/2024   LAMBDASER 4.5 (L) 05/02/2024   LAMBDASER 4.2 (L) 04/04/2024   LAMBDASER 4.5 (L) 03/07/2024   KAPLAMBRATIO 1.60 05/02/2024   KAPLAMBRATIO 1.55 04/04/2024   KAPLAMBRATIO 1.53 03/07/2024    RADIOGRAPHIC STUDIES: No results found.   ASSESSMENT & PLAN [name] is a 68 y.o.  male with medical history significant for  IgG Lambda Multiple Myeloma who presents for a follow up visit.  # IgG Lambda Multiple Myeloma, Relapsed (ISS Stage II) --findings are most consistent with relapsed multiple myeloma. Patient previously successfully treated with Velcade /Rev/Dex and Daratumumab /Velcade /Dex. On 07/02/2020 he transitioned to monthly daratumumab  alone.  --due to to rise  in M protein, switched to Kyprolis  and Dexamethasone  on 01/06/2022 Plan: --Due for Cycle 31, Day 1 of Kyprolis /Dex today  --Labs show WBC 6.7, hemoglobin 1.0, MCV 80, platelets 359, creatinine and calcium  levels are normal. --Most recent myeloma labs from 05/02/2024 shows M protein stable at 0.1 g/dL and kappa light chain 7.2, Lambda 4.5, Ratio 1.60 --No clinical evidence of relapse at this time. --Proceed with treatment today without any dose modifications. --Continue with weekly labs plus Kyprolis /Dex treatment as scheduled  #Anemia: --Likely secondary to MM +/- iron  deficiency --Currently on PO iron  therapy  daily --Will recheck iron  panel periodically.   #History of Sepsis 2/2 UTI and Fournier's gangrene of scrotum: --Hospitalized from 12/22/2022-01/08/2023. Underwent debridement of necrotic tissue. --Given PIP tazobactam and Zyvox  initially --Urine culture reveals Proteus mirabilis which is resistant to ciprofloxacin  and nitrofurantoin --Blood cultures negative --D/C on remainder doxycycline  and Augmentin  which he completed on 01/07/2023.  --Evaluated by urology on 7/2 and ID on 7/3 with continued wound healing and no further d/c or evidence of infection.   #History of DVT --He had placement of IVC filter, remains on Xarelto . --Due to poor mobility, and lack of bleeding complications, recommend to continue on Xarelto  indefinitely. --caution if Plt count were to drop <50  # Supportive Care -- provided patient with an albuterol  inhaler (for use with daratumumab ) --acyclovir  400mg  BID for VZV prophylaxis --zofran  8mg  q8H PRN for nausea/vomiting  --Zometa  is being held in the setting of his prior episode of osteonecrosis of the jaw  Orders Placed This Encounter  Procedures   Multiple Myeloma Panel (SPEP&IFE w/QIG)    Standing Status:   Future    Expected Date:   07/25/2024    Expiration Date:   07/25/2025   Kappa/lambda light chains    Standing Status:   Future    Expected Date:    07/25/2024    Expiration Date:   07/25/2025   CBC with Differential (Cancer Center Only)    Standing Status:   Future    Expected Date:   07/25/2024    Expiration Date:   07/25/2025   CMP (Cancer Center only)    Standing Status:   Future    Expected Date:   07/25/2024    Expiration Date:   07/25/2025   CBC with Differential (Cancer Center Only)    Standing Status:   Future    Expected Date:   08/01/2024    Expiration Date:   08/01/2025   CMP (Cancer Center only)    Standing Status:   Future    Expected Date:   08/01/2024    Expiration Date:   08/01/2025   CBC with Differential (Cancer Center Only)    Standing Status:   Future    Expected Date:   08/08/2024    Expiration Date:   08/08/2025   CMP (Cancer Center only)    Standing Status:   Future    Expected Date:   08/08/2024    Expiration Date:   08/08/2025    All questions were answered. The patient knows to call the clinic with any problems, questions or concerns.  I have spent a total of 30 minutes minutes of face-to-face and non-face-to-face time, preparing to see the patient,  performing a medically appropriate examination, counseling and educating the patient, communicating with other health care professionals, documenting clinical information in the electronic health record,  and care coordination.   Norleen IVAR Kidney, MD Department of Hematology/Oncology [facility] Phone: [phone] Pager: [phone] Email: [email or web address]   06/01/2024 2:57 PM   Literature Support:  Bringhen S, Mina R, Petrucci MT, Gaidano G, Ballanti S, Musto P, Offidani M, Spada S, Benevolo G, Ponticelli E, Galieni P, Cavo M, Di Toritto TC, Di Raimondo F, Montefusco V, Palumbo A, Boccadoro M, Larocca A. Once-weekly versus twice-weekly carfilzomib  in patients with newly diagnosed multiple myeloma: a pooled analysis of two phase I/II studies. Haematologica. 2019 Aug;104(8):1640-1647.  --Once-weekly 70 mg/m2 carfilzomib  as  induction and maintenance therapy for newly diagnosed multiple myeloma patients was as safe and effective as  twice-weekly 36 mg/m2 carfilzomib  and provided a more convenient schedule.

## 2024-05-30 ENCOUNTER — Inpatient Hospital Stay: Payer: Medicare (Managed Care) | Attending: Physician Assistant

## 2024-05-30 ENCOUNTER — Encounter: Payer: Self-pay | Admitting: Hematology and Oncology

## 2024-05-30 ENCOUNTER — Inpatient Hospital Stay (HOSPITAL_BASED_OUTPATIENT_CLINIC_OR_DEPARTMENT_OTHER): Payer: Medicare (Managed Care) | Admitting: Hematology and Oncology

## 2024-05-30 ENCOUNTER — Inpatient Hospital Stay: Payer: Medicare (Managed Care)

## 2024-05-30 VITALS — BP 135/76 | HR 80 | Temp 97.5°F | Resp 16 | Wt 248.0 lb

## 2024-05-30 DIAGNOSIS — C9002 Multiple myeloma in relapse: Secondary | ICD-10-CM | POA: Diagnosis not present

## 2024-05-30 DIAGNOSIS — D649 Anemia, unspecified: Secondary | ICD-10-CM | POA: Insufficient documentation

## 2024-05-30 DIAGNOSIS — Z95828 Presence of other vascular implants and grafts: Secondary | ICD-10-CM

## 2024-05-30 DIAGNOSIS — Z5111 Encounter for antineoplastic chemotherapy: Secondary | ICD-10-CM | POA: Insufficient documentation

## 2024-05-30 DIAGNOSIS — Z7901 Long term (current) use of anticoagulants: Secondary | ICD-10-CM | POA: Insufficient documentation

## 2024-05-30 DIAGNOSIS — Z86718 Personal history of other venous thrombosis and embolism: Secondary | ICD-10-CM | POA: Insufficient documentation

## 2024-05-30 LAB — CMP (CANCER CENTER ONLY)
ALT: 16 U/L (ref 0–44)
AST: 15 U/L (ref 15–41)
Albumin: 3.8 g/dL (ref 3.5–5.0)
Alkaline Phosphatase: 91 U/L (ref 38–126)
Anion gap: 6 (ref 5–15)
BUN: 11 mg/dL (ref 8–23)
CO2: 25 mmol/L (ref 22–32)
Calcium: 9.1 mg/dL (ref 8.9–10.3)
Chloride: 112 mmol/L — ABNORMAL HIGH (ref 98–111)
Creatinine: 0.78 mg/dL (ref 0.61–1.24)
GFR, Estimated: >60 mL/min (ref >60)
Glucose, Bld: 106 mg/dL — ABNORMAL HIGH (ref 70–99)
Potassium: 3.8 mmol/L (ref 3.5–5.1)
Sodium: 143 mmol/L (ref 135–145)
Total Bilirubin: 0.7 mg/dL (ref 0.0–1.2)
Total Protein: 6.3 g/dL — ABNORMAL LOW (ref 6.5–8.1)

## 2024-05-30 LAB — CBC WITH DIFFERENTIAL (CANCER CENTER ONLY)
Abs Immature Granulocytes: 0.03 K/uL (ref 0.00–0.07)
Basophils Absolute: 0.1 K/uL (ref 0.0–0.1)
Basophils Relative: 1 %
Eosinophils Absolute: 0.4 K/uL (ref 0.0–0.5)
Eosinophils Relative: 6 %
HCT: 32.3 % — ABNORMAL LOW (ref 39.0–52.0)
Hemoglobin: 11 g/dL — ABNORMAL LOW (ref 13.0–17.0)
Immature Granulocytes: 0 %
Lymphocytes Relative: 14 %
Lymphs Abs: 0.9 K/uL (ref 0.7–4.0)
MCH: 27.2 pg (ref 26.0–34.0)
MCHC: 34.1 g/dL (ref 30.0–36.0)
MCV: 80 fL (ref 80.0–100.0)
Monocytes Absolute: 0.8 K/uL (ref 0.1–1.0)
Monocytes Relative: 12 %
Neutro Abs: 4.5 K/uL (ref 1.7–7.7)
Neutrophils Relative %: 67 %
Platelet Count: 359 K/uL (ref 150–400)
RBC: 4.04 MIL/uL — ABNORMAL LOW (ref 4.22–5.81)
RDW: 18.6 % — ABNORMAL HIGH (ref 11.5–15.5)
WBC Count: 6.7 K/uL (ref 4.0–10.5)
nRBC: 0 % (ref 0.0–0.2)

## 2024-05-30 MED ORDER — SODIUM CHLORIDE 0.9 % IV SOLN: Freq: Once | INTRAVENOUS | Status: DC

## 2024-05-30 MED ORDER — SODIUM CHLORIDE 0.9 % IV SOLN: Freq: Once | INTRAVENOUS | Status: AC

## 2024-05-30 MED ORDER — SODIUM CHLORIDE 0.9 % IV SOLN
40.0000 mg | Freq: Once | INTRAVENOUS | Status: AC
  Administered 2024-05-30: 40 mg via INTRAVENOUS
  Filled 2024-05-30: qty 4

## 2024-05-30 MED ORDER — DEXTROSE 5 % IV SOLN
70.0000 mg/m2 | Freq: Once | INTRAVENOUS | Status: AC
  Administered 2024-05-30: 150 mg via INTRAVENOUS
  Filled 2024-05-30: qty 60

## 2024-05-31 ENCOUNTER — Encounter: Payer: Self-pay | Admitting: Hematology and Oncology

## 2024-06-01 ENCOUNTER — Encounter: Payer: Self-pay | Admitting: Hematology and Oncology

## 2024-06-02 LAB — KAPPA/LAMBDA LIGHT CHAINS
Kappa free light chain: 7.2 mg/L (ref 3.3–19.4)
Kappa, lambda light chain ratio: 1.29 (ref 0.26–1.65)
Lambda free light chains: 5.6 mg/L — ABNORMAL LOW (ref 5.7–26.3)

## 2024-06-04 ENCOUNTER — Other Ambulatory Visit: Payer: Self-pay

## 2024-06-04 LAB — MULTIPLE MYELOMA PANEL, SERUM
Albumin SerPl Elph-Mcnc: 3 g/dL (ref 2.9–4.4)
Albumin/Glob SerPl: 1.2 (ref 0.7–1.7)
Alpha 1: 0.3 g/dL (ref 0.0–0.4)
Alpha2 Glob SerPl Elph-Mcnc: 0.8 g/dL (ref 0.4–1.0)
B-Globulin SerPl Elph-Mcnc: 1 g/dL (ref 0.7–1.3)
Gamma Glob SerPl Elph-Mcnc: 0.4 g/dL (ref 0.4–1.8)
Globulin, Total: 2.6 g/dL (ref 2.2–3.9)
IgA: 35 mg/dL — ABNORMAL LOW (ref 61–437)
IgG (Immunoglobin G), Serum: 461 mg/dL — ABNORMAL LOW (ref 603–1613)
IgM (Immunoglobulin M), Srm: 14 mg/dL — ABNORMAL LOW (ref 20–172)
M Protein SerPl Elph-Mcnc: 0.1 g/dL — ABNORMAL HIGH
Total Protein ELP: 5.6 g/dL — ABNORMAL LOW (ref 6.0–8.5)

## 2024-06-05 MED FILL — Dexamethasone Sodium Phosphate Inj 100 MG/10ML: INTRAMUSCULAR | Qty: 4 | Status: AC

## 2024-06-06 ENCOUNTER — Inpatient Hospital Stay: Payer: Medicare (Managed Care)

## 2024-06-06 ENCOUNTER — Encounter: Payer: Self-pay | Admitting: Hematology and Oncology

## 2024-06-06 ENCOUNTER — Other Ambulatory Visit: Payer: Self-pay | Admitting: Hematology and Oncology

## 2024-06-06 VITALS — BP 157/72 | HR 67 | Resp 16

## 2024-06-06 DIAGNOSIS — C9002 Multiple myeloma in relapse: Secondary | ICD-10-CM

## 2024-06-06 DIAGNOSIS — Z5111 Encounter for antineoplastic chemotherapy: Secondary | ICD-10-CM | POA: Diagnosis not present

## 2024-06-06 LAB — CBC WITH DIFFERENTIAL (CANCER CENTER ONLY)
Abs Immature Granulocytes: 0.03 K/uL (ref 0.00–0.07)
Basophils Absolute: 0 K/uL (ref 0.0–0.1)
Basophils Relative: 0 %
Eosinophils Absolute: 0.3 K/uL (ref 0.0–0.5)
Eosinophils Relative: 4 %
HCT: 31.8 % — ABNORMAL LOW (ref 39.0–52.0)
Hemoglobin: 11.1 g/dL — ABNORMAL LOW (ref 13.0–17.0)
Immature Granulocytes: 0 %
Lymphocytes Relative: 10 %
Lymphs Abs: 0.8 K/uL (ref 0.7–4.0)
MCH: 27.8 pg (ref 26.0–34.0)
MCHC: 34.9 g/dL (ref 30.0–36.0)
MCV: 79.7 fL — ABNORMAL LOW (ref 80.0–100.0)
Monocytes Absolute: 0.9 K/uL (ref 0.1–1.0)
Monocytes Relative: 12 %
Neutro Abs: 5.5 K/uL (ref 1.7–7.7)
Neutrophils Relative %: 74 %
Platelet Count: 142 K/uL — ABNORMAL LOW (ref 150–400)
RBC: 3.99 MIL/uL — ABNORMAL LOW (ref 4.22–5.81)
RDW: 18.8 % — ABNORMAL HIGH (ref 11.5–15.5)
WBC Count: 7.6 K/uL (ref 4.0–10.5)
nRBC: 0 % (ref 0.0–0.2)

## 2024-06-06 LAB — CMP (CANCER CENTER ONLY)
ALT: 12 U/L (ref 0–44)
AST: 13 U/L — ABNORMAL LOW (ref 15–41)
Albumin: 3.6 g/dL (ref 3.5–5.0)
Alkaline Phosphatase: 77 U/L (ref 38–126)
Anion gap: 5 (ref 5–15)
BUN: 14 mg/dL (ref 8–23)
CO2: 27 mmol/L (ref 22–32)
Calcium: 9.4 mg/dL (ref 8.9–10.3)
Chloride: 111 mmol/L (ref 98–111)
Creatinine: 0.73 mg/dL (ref 0.61–1.24)
GFR, Estimated: >60 mL/min (ref >60)
Glucose, Bld: 94 mg/dL (ref 70–99)
Potassium: 3.7 mmol/L (ref 3.5–5.1)
Sodium: 143 mmol/L (ref 135–145)
Total Bilirubin: 0.7 mg/dL (ref 0.0–1.2)
Total Protein: 5.7 g/dL — ABNORMAL LOW (ref 6.5–8.1)

## 2024-06-06 MED ORDER — DEXTROSE 5 % IV SOLN
70.0000 mg/m2 | Freq: Once | INTRAVENOUS | Status: AC
  Administered 2024-06-06: 150 mg via INTRAVENOUS
  Filled 2024-06-06: qty 60

## 2024-06-06 MED ORDER — SODIUM CHLORIDE 0.9 % IV SOLN
40.0000 mg | Freq: Once | INTRAVENOUS | Status: AC
  Administered 2024-06-06: 40 mg via INTRAVENOUS
  Filled 2024-06-06: qty 4

## 2024-06-06 MED ORDER — SODIUM CHLORIDE 0.9 % IV SOLN: Freq: Once | INTRAVENOUS | Status: AC

## 2024-06-06 MED ORDER — SODIUM CHLORIDE 0.9 % IV SOLN: Freq: Once | INTRAVENOUS | Status: DC

## 2024-06-06 NOTE — Patient Instructions (Signed)
 [facility] - A DEPT OF [facility]   Discharge Instructions: Thank you for choosing [facility] to provide your oncology and hematology care.   If you have a lab appointment with the Cancer Center, please go directly to the Cancer Center and check in at the registration area.   Wear comfortable clothing and clothing appropriate for easy access to any Portacath or PICC line.   We strive to give you quality time with your provider. You may need to reschedule your appointment if you arrive late (15 or more minutes).  Arriving late affects you and other patients whose appointments are after yours.  Also, if you miss three or more appointments without notifying the office, you may be dismissed from the clinic at the provider's discretion.      For prescription refill requests, have your pharmacy contact our office and allow 72 hours for refills to be completed.    Today you received the following chemotherapy and/or immunotherapy agents: Carfilzomib (Kyprolis)      To help prevent nausea and vomiting after your treatment, we encourage you to take your nausea medication as directed.  BELOW ARE SYMPTOMS THAT SHOULD BE REPORTED IMMEDIATELY: *FEVER GREATER THAN 100.4 F (38 C) OR HIGHER *CHILLS OR SWEATING *NAUSEA AND VOMITING THAT IS NOT CONTROLLED WITH YOUR NAUSEA MEDICATION *UNUSUAL SHORTNESS OF BREATH *UNUSUAL BRUISING OR BLEEDING *URINARY PROBLEMS (pain or burning when urinating, or frequent urination) *BOWEL PROBLEMS (unusual diarrhea, constipation, pain near the anus) TENDERNESS IN MOUTH AND THROAT WITH OR WITHOUT PRESENCE OF ULCERS (sore throat, sores in mouth, or a toothache) UNUSUAL RASH, SWELLING OR PAIN  UNUSUAL VAGINAL DISCHARGE OR ITCHING   Items with * indicate a potential emergency and should be followed up as soon as possible or go to the Emergency Department if any problems should occur.  Please show the CHEMOTHERAPY ALERT CARD or  IMMUNOTHERAPY ALERT CARD at check-in to the Emergency Department and triage nurse.  Should you have questions after your visit or need to cancel or reschedule your appointment, please contact [facility]  Dept: [phone]  and follow the prompts.  Office hours are 8:00 a.m. to 4:30 p.m. Monday - Friday. Please note that voicemails left after 4:00 p.m. may not be returned until the following business day.  We are closed weekends and major holidays. You have access to a nurse at all times for urgent questions. Please call the main number to the clinic Dept: [phone] and follow the prompts.   For any non-urgent questions, you may also contact your provider using MyChart. We now offer e-Visits for anyone 55 and older to request care online for non-urgent symptoms. For details visit [email or web address].   Also download the MyChart app! Go to the app store, search "MyChart", open the app, select [place], and log in with your MyChart username and password.

## 2024-06-11 MED FILL — Dexamethasone Sodium Phosphate Inj 100 MG/10ML: INTRAMUSCULAR | Qty: 4 | Status: AC

## 2024-06-12 ENCOUNTER — Other Ambulatory Visit: Payer: Self-pay

## 2024-06-12 NOTE — Progress Notes (Signed)
 [facility] [phone]   [phone]  PROGRESS NOTE  Patient Care Team: Gary Annabella CROME, DO as PCP - General (Geriatric Medicine) Gary Arthea DASEN, MD as Consulting Physician (Physical Medicine and Rehabilitation) Gary Toribio PARAS, PA-C as Physician Assistant (Physical Medicine and Rehabilitation) Gary Norleen Howell MADISON, MD as Consulting Physician (Hematology and Oncology) Gary Norleen Howell MADISON, MD as Consulting Physician (Hematology and Oncology) Gary Norleen Howell MADISON, MD as Consulting Physician (Hematology and Oncology)  Hematological/Oncological History # IgG Lambda Multiple Myeloma, Relapsed (ISS Stage II) 1) 06/2010: initial diagnosis of Multiple Myeloma after T8 compression fracture. Treated with Velcade /Revlimid /Dexamethasone  and achieved a complete remission 2) Velcade  was discontinued in September 2012 and that Revlimid  and Decadron  were discontinued in March 2013. 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 4) Followed by Dr. Lonn, last clinic visit 10/09/2019. At that time there was concern for relapse of his multiple myeloma.  5) Patient requested transfer to different provider after misunderstanding regarding imaging studies 6) 12/17/2019: transfer care to Dr. Federico  7) 01/09/2020: Cycle 1 Day 1 of Dara/Velcade /Dex 8) 01/21/2020: presented as urgent visit for diarrhea and dehydration. Holding chemotherapy scheduled for 01/23/2020. 9) 01/30/2020: Resume dara/velcade /dex after resolution of diarrhea.  10) 02/13/2020: restaging labs show M protein 0.8, Kappa 4.5, lamba 17.2, ratio 0.26, urine M protein 53 (7.1%). All MM labs indicate improvement.  11) 03/10/2020: Cycle 4 Day 1 of Dara/Velcade /Dex. Transition to q 3 week daratumumab .  12) 06/04/2020:  Cycle 8 Day 1 of Dara/Velcade /Dex 13) 06/25/2020:  Cycle 9 Day 1 of Dara/Velcade /Dex 14) 07/22/2020: Cycle 10 Day  1 of  Dara//Dex 15) 08/18/2020: Cycle 11 Day 1 of Dara//Dex 16) 09/23/2020: Cycle 12 Day 1 of Dara//Dex 17) 10/22/2020: Cycle 13 Day 1 of Dara/Dex  18) 11/19/2020: Cycle 14 Day 1 of Dara/Dex  19) 12/17/2020: Cycle 15 Day 1 of Dara/Dex  20) 01/17/2021: Cycle 16 Day 1 of Dara/Dex  21) 02/11/2021: Cycle 17 Day 1 of Dara/Dex 22) 03/10/2021: Cycle 18 Day 1 of Dara/Dex  23) 04/08/2021: Cycle 19 Day 1 of Dara/Dex  24) 08/03/2021: Cycle 20 Day 1 of Dara/Dex (delayed due to scheduling error) 25) 09/02/2021: Cycle 21 Day 1 of Dara/Dex 26) 09/30/2021: Cycle 22 Day 1 of Dara/Dex 27) 10/28/2021: Cycle 23 Day 1 of Dara/Dex 28) 12/02/2021: Cycle 24 Day 1 of Dara/Dex 29) 12/30/2021: Cycle 25 Day 1 of Dara/Dex 30) 01/06/2022: Cycle 1 Day 1 of Kyprolis /Dex 31) 02/03/2022: Cycle 2 Day 1 of Kyprolis /Dex 32) 02/24/2022: Cycle 3 Day 1 of Kyprolis /Dex 33) 04/07/2022: Cycle 4 Day 1 of Kyprolis /Dex 34) 05/05/2022: Cycle 5 Day 1 of Kyprolis /Dex 35) 06/01/2022: Cycle 6 Day 1 of Kyprolis /Dex 36) 06/29/2022: Cycle 7 Day 1 of Kyprolis /Dex 37) 07/28/2022: Cycle 8 Day 1 of Kyprolis /Dex 38) 08/25/2022: Cycle 9 Day 1 of Kyprolis /Dex 39) 09/22/2022: Cycle 10 Day 1 of Kyprolis /Dex 40) 10/20/2022: Cycle 11 Day 1 of Kyprolis /Dex 41) 11/17/2022: Cycle 12 Day 1 of Kyprolis /Dex 42 ) 12/22/2022-01/08/2023: Admitted for fournier's gangrene of scrotum.  43) 01/26/2023: Cycle 13 Day 1 of Kyprolis /Dex 44) 02/16/2023: Cycle 14 Day 1 of Kyprolis /Dex 45) 03/16/2023:Cycle 15 Day 1 of Kyprolis /Dex 46) 04/13/2023: Cycle 16 Day 1 of Kyprolis /Dex 47) 05/11/2023: Cycle 17 Day 1 of Kyprolis /Dex 48) 06/07/2023: Cycle 18 Day 1 of Kyprolis /Dex 49) 07/06/2023: Cycle 19 Day 1 of Kyprolis /Dex 50) 08/02/2023: Cycle 20 Day 1 of Kyprolis /Dex 51) 08/31/2023: Cycle 21 Day 1 of  Kyprolis /Dex 52) 09/28/2023: Cycle 22 Day 1 of Kyprolis /Dex 53) 10/26/2023: Cycle 23 Day 1 of Kyprolis /Dex 54) 11/23/2023: Cycle 24 Day 1 of Kyprolis /Dex 55) 12/21/2023: Cycle 25 Day 1 of Kyprolis /Dex 56)  01/18/2024: Cycle 26 Day 1 of Kyprolis /Dex 57) 02/08/2024: Cycle 27 Day 1 of Kyprolis /Dex 58) 03/07/2024: Cycle 28 Day 1 of Kyprolis /Dex 59) 04/04/2024: Cycle 29 Day 1 of Kyprolis /Dex 60) 05/02/2024: Cycle 30 Day 1 Kyprolis /Dex 61) 05/30/2024: Cycle 31 Day 1 Kyprolis /Dex  Interval History:  [name] 68 y.o. male with medical history significant for  IgG Lambda Multiple Myeloma who presents for a follow up visit. The patient's last visit was on 05/30/2024.  He presents today prior to treatment with Kyprolis /Dex. He is unaccompanied for this visit.   On exam today Mr. [name] reports he is doing well without any new or concerning symptoms.  His energy is overall stable and he can complete all his ADLs at his baseline.  He denies nausea, vomiting or bowel habit changes.  He denies easy bruising or signs of active bleeding.  He denies fevers, chills, night sweats, shortness of breath, chest pain or cough.  He has no other complaints.  Rest of the 10 point ROS as below.   MEDICAL HISTORY:  Past Medical History:  Diagnosis Date   Adrenal insufficiency    on chronic dexamethasone    Anemia    Cancer (HCC)    Coagulopathy    on xeralto/ s/p DVT while on coumadin,  IVC in place   Diabetes mellitus without complication (HCC)    type 2   Glaucoma 12/22/2022   Gross hematuria 01/2013   post foley cath procedure   History of blood transfusion 01/2013   Lower GI bleeding 04/18/2017   Multiple myeloma    thoracic T8 with paraplegia s/p resection- on chemo at visit 10/13/10   Multiple myeloma    Multiple myeloma without mention of remission    Neurogenic bladder    Neurogenic bowel    OSA (obstructive sleep apnea) 11/01/2022   Paraplegia (HCC)    Partial small bowel obstruction (HCC) during dec 2011 admission    SURGICAL HISTORY: Past Surgical History:  Procedure Laterality Date   COLONOSCOPY WITH PROPOFOL  N/A 04/12/2017   Procedure: COLONOSCOPY WITH PROPOFOL ;  Surgeon: Gary Norleen SAILOR,  MD;  Location: [facility];  Service: Endoscopy;  Laterality: N/A;   COLONOSCOPY WITH PROPOFOL  N/A 04/19/2017   Procedure: COLONOSCOPY WITH PROPOFOL ;  Surgeon: Leigh Elspeth SQUIBB, MD;  Location: [facility];  Service: Gastroenterology;  Laterality: N/A;   COLOSTOMY  07/20/2011   Procedure: COLOSTOMY;  Surgeon: Redell Alm Faith, DO;  Location: [facility] OR;  Service: General;;   COLOSTOMY REVISION  07/20/2011   Procedure: COLON RESECTION SIGMOID;  Surgeon: Redell Alm Faith, DO;  Location: [facility] OR;  Service: General;;   CYSTOSCOPY N/A 04/04/2013   Procedure: CYSTOSCOPY WITH LITHALOPAXY;  Surgeon: Ricardo Likens, MD;  Location: [facility];  Service: Urology;  Laterality: N/A;   INCISION AND DRAINAGE ABSCESS N/A 12/25/2022   Procedure: INCISION AND DRAINAGE OF SCROTUM;  Surgeon: Lovie Arlyss CROME, MD;  Location: [facility];  Service: Urology;  Laterality: N/A;   INSERTION OF SUPRAPUBIC CATHETER N/A 04/04/2013   Procedure: INSERTION OF SUPRAPUBIC CATHETER;  Surgeon: Ricardo Likens, MD;  Location: [facility];  Service: Urology;  Laterality: N/A;   LAPAROTOMY  07/20/2011   Procedure: EXPLORATORY LAPAROTOMY;  Surgeon: Redell Alm Faith, DO;  Location: [facility] OR;  Service: General;  Laterality: N/A;   myeloma thoracic  T8 with parpaplegia s/p thoracotomy and thoracic T7-9 cage placement on Dec 26th 2011  07/18/10   SCROTAL EXPLORATION N/A 12/29/2022   Procedure: SCROTAL WOUND DEBRIDEMENT AND CLOSURE;  Surgeon: Alvaro Ricardo KATHEE Mickey., MD;  Location: [facility];  Service: Urology;  Laterality: N/A;    SOCIAL HISTORY: Social History   Socioeconomic History   Marital status: Married    Spouse name: Not on file   Number of children: Not on file   Years of education: Not on file   Highest education level: Not on file  Occupational History   Not on file  Tobacco Use   Smoking status: Never   Smokeless tobacco: Never  Vaping Use   Vaping status: Never Used  Substance and Sexual Activity   Alcohol use: No   Drug use: No    Sexual activity: Never  Other Topics Concern   Not on file  Social History Narrative   Not on file   Social Drivers of Health   Financial Resource Strain: Not on file  Food Insecurity: No Food Insecurity (12/22/2022)   Hunger Vital Sign    Worried About Running Out of Food in the Last Year: Never true    Ran Out of Food in the Last Year: Never true  Transportation Needs: No Transportation Needs (12/22/2022)   PRAPARE - Administrator, Civil Service (Medical): No    Lack of Transportation (Non-Medical): No  Physical Activity: Not on file  Stress: Not on file  Social Connections: Not on file  Intimate Partner Violence: Not At Risk (12/22/2022)   Humiliation, Afraid, Rape, and Kick questionnaire    Fear of Current or Ex-Partner: No    Emotionally Abused: No    Physically Abused: No    Sexually Abused: No    FAMILY HISTORY: Family History  Problem Relation Age of Onset   Ovarian cancer Mother    Diabetes Father     ALLERGIES:  is allergic to ferumoxytol .  MEDICATIONS:  Current Outpatient Medications  Medication Sig Dispense Refill   acetaminophen  (TYLENOL ) 325 MG tablet Take 325-650 mg by mouth every 6 (six) hours as needed for headache or mild pain.     acetic acid 0.25 % irrigation Irrigate with 1 Application as directed See admin instructions. Instill 50 ml's, clamp for 10 minutes, then remove clamp and drain the bladder. Repeat 3 times a week- when not using the Renacidin irrigation     acyclovir  (ZOVIRAX ) 400 MG tablet Take 1 tablet (400 mg total) by mouth 2 (two) times daily. 60 tablet 2   albuterol  (VENTOLIN  HFA) 108 (90 Base) MCG/ACT inhaler Inhale 2 puffs into the lungs every 4 (four) hours as needed for wheezing or shortness of breath.     atorvastatin  (LIPITOR) 20 MG tablet Take 20 mg by mouth daily.     BENADRYL  ALLERGY 25 MG tablet Take 25 mg by mouth every 6 (six) hours as needed (for allergic reactions).     BIOFREEZE 4 % GEL Apply 1 application   topically every 4 (four) hours as needed (for joint pain- to intact areas of the skin only).     Citric Ac-Gluconolact-Mg Carb (RENACIDIN IR) Irrigate with 30 mLs as directed See admin instructions. Instill 30 ml's, clamp for 10 minutes, then remove clamp and drain the bladder. Repeat 3 times a week- when not using the acetic acid irrigation     COSOPT  PF 2-0.5 % SOLN Place 1 drop into both eyes 2 (two) times daily.  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     Emollient (EUCERIN EX) Apply 1 application  topically See admin instructions. Apply to affected area 2 times a day     FLOVENT HFA 110 MCG/ACT inhaler 2 each See admin instructions. 2 sprays to colostomy stoma prior to each colostomy bag change     guaifenesin  (ROBITUSSIN) 100 MG/5ML syrup Take 200 mg by mouth 4 (four) times daily as needed for cough.     iron  polysaccharides (NU-IRON ) 150 MG capsule Take 1 capsule (150 mg total) by mouth daily. 30 capsule 0   latanoprost  (XALATAN ) 0.005 % ophthalmic solution Place 1 drop into both eyes at bedtime.     Multiple Vitamin (MULTIVITAMIN WITH MINERALS) TABS tablet Take 1 tablet by mouth daily. 30 tablet 0   NON FORMULARY Apply 1 application  topically See admin instructions. Colo Plast paste- Apply as directed with each ostomy bag change     ondansetron  (ZOFRAN ) 4 MG tablet Take 1 tablet (4 mg) by mouth every 6 hours as needed for nausea. 20 tablet 0   rivaroxaban  (XARELTO ) 10 MG TABS tablet Take 1 tablet (10 mg total) by mouth daily with supper. (Patient taking differently: Take 10 mg by mouth daily.) 30 tablet 9   Skin Protectants, Misc. (MINERIN CREME) CREA Apply to affected areas 2 times daily as needed. 454 g 0   Zinc  Oxide (BALMEX EX) Apply 1 application  topically See admin instructions. Apply topically daily to affected sites     No current facility-administered medications for this visit.    Facility-Administered Medications Ordered in Other Visits  Medication Dose Route Frequency Provider Last Rate Last Admin   sodium chloride  flush (NS) 0.9 % injection 10 mL  10 mL Intravenous PRN [name], Ni, MD        REVIEW OF SYSTEMS:   Constitutional: ( - ) fevers, ( - )  chills , ( - ) night sweats Eyes: ( - ) blurriness of vision, ( - ) double vision, ( - ) watery eyes Ears, nose, mouth, throat, and face: ( - ) mucositis, ( - ) sore throat Respiratory: ( - ) cough, ( - ) dyspnea, ( - ) wheezes Cardiovascular: ( - ) palpitation, ( - ) chest discomfort, ( - ) lower extremity swelling Gastrointestinal:  ( - ) nausea, ( - ) heartburn, ( - ) change in bowel habits Skin: ( - ) abnormal skin rashes Lymphatics: ( - ) new lymphadenopathy, ( - ) easy bruising Neurological: ( - ) numbness, ( - ) tingling, ( - ) new weaknesses Behavioral/Psych: ( - ) mood change, ( - ) new changes  All other systems were reviewed with the patient and are negative.  PHYSICAL EXAMINATION: ECOG PERFORMANCE STATUS: paraplegic.   Vitals:   06/13/24 1058 06/13/24 1100  BP: (!) 129/91 138/75  Pulse: 79   Resp: 16   Temp: 97.8 F (36.6 C)   SpO2: 99%       Filed Weights   06/13/24 1058  Weight: 251 lb (113.9 kg)       GENERAL: well appearing middle aged African American male alert, no distress and comfortable SKIN: skin color, texture, turgor are normal, no rashes or significant lesions EYES: conjunctiva are pink and non-injected, sclera clear LUNGS: clear to auscultation and percussion with normal breathing effort HEART: regular rate & rhythm and no murmurs Musculoskeletal: no cyanosis of digits  and no clubbing  PSYCH: alert & oriented x 3, fluent speech NEURO: paraplegic, no use of LE bilaterally.   LABORATORY DATA:  I have reviewed the data as listed    Latest Ref Rng & Units 06/13/2024   10:26 AM 06/06/2024   10:31 AM 05/30/2024   10:44 AM  CBC  WBC 4.0 - 10.5 K/uL 7.3  7.6  6.7    Hemoglobin 13.0 - 17.0 g/dL 89.0  88.8  88.9   Hematocrit 39.0 - 52.0 % 31.6  31.8  32.3   Platelets 150 - 400 K/uL 161  142  359        Latest Ref Rng & Units 06/13/2024   10:26 AM 06/06/2024   10:31 AM 05/30/2024   10:44 AM  CMP  Glucose 70 - 99 mg/dL 889  94  893   BUN 8 - 23 mg/dL 15  14  11    Creatinine 0.61 - 1.24 mg/dL 9.20  9.26  9.21   Sodium 135 - 145 mmol/L 142  143  143   Potassium 3.5 - 5.1 mmol/L 3.6  3.7  3.8   Chloride 98 - 111 mmol/L 110  111  112   CO2 22 - 32 mmol/L 23  27  25    Calcium  8.9 - 10.3 mg/dL 9.5  9.4  9.1   Total Protein 6.5 - 8.1 g/dL 6.0  5.7  6.3   Total Bilirubin 0.0 - 1.2 mg/dL 0.7  0.7  0.7   Alkaline Phos 38 - 126 U/L 96  77  91   AST 15 - 41 U/L 17  13  15    ALT 0 - 44 U/L 18  12  16      Lab Results  Component Value Date   MPROTEIN 0.1 (H) 05/30/2024   MPROTEIN 0.1 (H) 05/02/2024   MPROTEIN 0.1 (H) 04/04/2024   Lab Results  Component Value Date   KPAFRELGTCHN 7.2 05/30/2024   KPAFRELGTCHN 7.2 05/02/2024   KPAFRELGTCHN 6.5 04/04/2024   LAMBDASER 5.6 (L) 05/30/2024   LAMBDASER 4.5 (L) 05/02/2024   LAMBDASER 4.2 (L) 04/04/2024   KAPLAMBRATIO 1.29 05/30/2024   KAPLAMBRATIO 1.60 05/02/2024   KAPLAMBRATIO 1.55 04/04/2024    RADIOGRAPHIC STUDIES: No results found.     ASSESSMENT & PLAN [name] is a 68 y.o.  male with medical history significant for  IgG Lambda Multiple Myeloma who presents for a follow up visit.  # IgG Lambda Multiple Myeloma, Relapsed (ISS Stage II) --findings are most consistent with relapsed multiple myeloma. Patient previously successfully treated with Velcade /Rev/Dex and Daratumumab /Velcade /Dex. On 07/02/2020 he transitioned to monthly daratumumab  alone.  --due to to rise in M protein, switched to Kyprolis  and Dexamethasone  on 01/06/2022 Plan: --Due for Cycle 31, Day 15 of Kyprolis /Dex today  --Labs show WBC 7.3, hemoglobin 10.9, platelets 161, creatinine and LFTs are in range. --Most recent  myeloma labs from 05/30/2024 shows M protein stable at 0.1 g/dL and kappa light chain 7.2, lambda light chain 5.6, ratio 1.29  --No clinical evidence of relapse at this time. --Proceed with treatment today without any dose modifications. --Continue with weekly labs plus Kyprolis /Dex treatment as scheduled  #Anemia: --Likely secondary to MM +/- iron  deficiency --Currently on PO iron  therapy daily --Will recheck iron  panel periodically.   #History of Sepsis 2/2 UTI and Fournier's gangrene of scrotum: --Hospitalized from 12/22/2022-01/08/2023. Underwent debridement of necrotic tissue. --Given PIP tazobactam and Zyvox  initially --Urine culture reveals Proteus mirabilis which is resistant to ciprofloxacin  and nitrofurantoin --Blood  cultures negative --D/C on remainder doxycycline  and Augmentin  which he completed on 01/07/2023.  --Evaluated by urology on 7/2 and ID on 7/3 with continued wound healing and no further d/c or evidence of infection.   #History of DVT --He had placement of IVC filter, remains on Xarelto . --Due to poor mobility, and lack of bleeding complications, recommend to continue on Xarelto  indefinitely. --caution if Plt count were to drop <50  # Supportive Care -- provided patient with an albuterol  inhaler (for use with daratumumab ) --acyclovir  400mg  BID for VZV prophylaxis --zofran  8mg  q8H PRN for nausea/vomiting  --Zometa  is being held in the setting of his prior episode of osteonecrosis of the jaw  No orders of the defined types were placed in this encounter.   All questions were answered. The patient knows to call the clinic with any problems, questions or concerns.  I have spent a total of 30 minutes minutes of face-to-face and non-face-to-face time, preparing to see the patient,  performing a medically appropriate examination, counseling and educating the patient, communicating with other health care professionals, documenting clinical information in the electronic  health record,  and care coordination.   Johnston Police PA-C Dept of Hematology and Oncology [facility] Phone: [phone]     06/13/2024 11:39 AM   Literature Support:  Bringhen S, Mina R, Petrucci MT, Gaidano G, Ballanti S, Musto P, Offidani M, Spada S, Benevolo G, Ponticelli E, Galieni P, Cavo M, Di Toritto TC, Di Raimondo F, Montefusco V, Palumbo A, Boccadoro M, Larocca A. Once-weekly versus twice-weekly carfilzomib  in patients with newly diagnosed multiple myeloma: a pooled analysis of two phase I/II studies. Haematologica. 2019 Aug;104(8):1640-1647.  --Once-weekly 70 mg/m2 carfilzomib  as induction and maintenance therapy for newly diagnosed multiple myeloma patients was as safe and effective as twice-weekly 36 mg/m2 carfilzomib  and provided a more convenient schedule.

## 2024-06-13 ENCOUNTER — Inpatient Hospital Stay: Payer: Medicare (Managed Care) | Admitting: Physician Assistant

## 2024-06-13 ENCOUNTER — Encounter: Payer: Self-pay | Admitting: Hematology and Oncology

## 2024-06-13 ENCOUNTER — Inpatient Hospital Stay: Payer: Medicare (Managed Care)

## 2024-06-13 VITALS — BP 138/75 | HR 79 | Temp 97.8°F | Resp 16 | Ht 68.0 in | Wt 251.0 lb

## 2024-06-13 DIAGNOSIS — C9002 Multiple myeloma in relapse: Secondary | ICD-10-CM

## 2024-06-13 DIAGNOSIS — Z5111 Encounter for antineoplastic chemotherapy: Secondary | ICD-10-CM | POA: Diagnosis not present

## 2024-06-13 LAB — CMP (CANCER CENTER ONLY)
ALT: 18 U/L (ref 0–44)
AST: 17 U/L (ref 15–41)
Albumin: 3.6 g/dL (ref 3.5–5.0)
Alkaline Phosphatase: 96 U/L (ref 38–126)
Anion gap: 9 (ref 5–15)
BUN: 15 mg/dL (ref 8–23)
CO2: 23 mmol/L (ref 22–32)
Calcium: 9.5 mg/dL (ref 8.9–10.3)
Chloride: 110 mmol/L (ref 98–111)
Creatinine: 0.79 mg/dL (ref 0.61–1.24)
GFR, Estimated: >60 mL/min (ref >60)
Glucose, Bld: 110 mg/dL — ABNORMAL HIGH (ref 70–99)
Potassium: 3.6 mmol/L (ref 3.5–5.1)
Sodium: 142 mmol/L (ref 135–145)
Total Bilirubin: 0.7 mg/dL (ref 0.0–1.2)
Total Protein: 6 g/dL — ABNORMAL LOW (ref 6.5–8.1)

## 2024-06-13 LAB — CBC WITH DIFFERENTIAL (CANCER CENTER ONLY)
Abs Immature Granulocytes: 0.03 K/uL (ref 0.00–0.07)
Basophils Absolute: 0 K/uL (ref 0.0–0.1)
Basophils Relative: 0 %
Eosinophils Absolute: 0.2 K/uL (ref 0.0–0.5)
Eosinophils Relative: 3 %
HCT: 31.6 % — ABNORMAL LOW (ref 39.0–52.0)
Hemoglobin: 10.9 g/dL — ABNORMAL LOW (ref 13.0–17.0)
Immature Granulocytes: 0 %
Lymphocytes Relative: 11 %
Lymphs Abs: 0.8 K/uL (ref 0.7–4.0)
MCH: 27.4 pg (ref 26.0–34.0)
MCHC: 34.5 g/dL (ref 30.0–36.0)
MCV: 79.4 fL — ABNORMAL LOW (ref 80.0–100.0)
Monocytes Absolute: 1 K/uL (ref 0.1–1.0)
Monocytes Relative: 14 %
Neutro Abs: 5.2 K/uL (ref 1.7–7.7)
Neutrophils Relative %: 72 %
Platelet Count: 161 K/uL (ref 150–400)
RBC: 3.98 MIL/uL — ABNORMAL LOW (ref 4.22–5.81)
RDW: 18.6 % — ABNORMAL HIGH (ref 11.5–15.5)
WBC Count: 7.3 K/uL (ref 4.0–10.5)
nRBC: 0 % (ref 0.0–0.2)

## 2024-06-13 MED ORDER — SODIUM CHLORIDE 0.9 % IV SOLN: Freq: Once | INTRAVENOUS | Status: AC

## 2024-06-13 MED ORDER — DEXTROSE 5 % IV SOLN
70.0000 mg/m2 | Freq: Once | INTRAVENOUS | Status: AC
  Administered 2024-06-13: 150 mg via INTRAVENOUS
  Filled 2024-06-13: qty 60

## 2024-06-13 MED ORDER — SODIUM CHLORIDE 0.9 % IV SOLN
40.0000 mg | Freq: Once | INTRAVENOUS | Status: AC
  Administered 2024-06-13: 40 mg via INTRAVENOUS
  Filled 2024-06-13: qty 4

## 2024-06-13 NOTE — Patient Instructions (Signed)
 [facility] - A DEPT OF [facility]  Discharge Instructions: Thank you for choosing [facility] to provide your oncology and hematology care.   If you have a lab appointment with the Cancer Center, please go directly to the Cancer Center and check in at the registration area.   Wear comfortable clothing and clothing appropriate for easy access to any Portacath or PICC line.   We strive to give you quality time with your provider. You may need to reschedule your appointment if you arrive late (15 or more minutes).  Arriving late affects you and other patients whose appointments are after yours.  Also, if you miss three or more appointments without notifying the office, you may be dismissed from the clinic at the provider's discretion.      For prescription refill requests, have your pharmacy contact our office and allow 72 hours for refills to be completed.    Today you received the following chemotherapy and/or immunotherapy agents Kyprolis      To help prevent nausea and vomiting after your treatment, we encourage you to take your nausea medication as directed.  BELOW ARE SYMPTOMS THAT SHOULD BE REPORTED IMMEDIATELY: *FEVER GREATER THAN 100.4 F (38 C) OR HIGHER *CHILLS OR SWEATING *NAUSEA AND VOMITING THAT IS NOT CONTROLLED WITH YOUR NAUSEA MEDICATION *UNUSUAL SHORTNESS OF BREATH *UNUSUAL BRUISING OR BLEEDING *URINARY PROBLEMS (pain or burning when urinating, or frequent urination) *BOWEL PROBLEMS (unusual diarrhea, constipation, pain near the anus) TENDERNESS IN MOUTH AND THROAT WITH OR WITHOUT PRESENCE OF ULCERS (sore throat, sores in mouth, or a toothache) UNUSUAL RASH, SWELLING OR PAIN  UNUSUAL VAGINAL DISCHARGE OR ITCHING   Items with * indicate a potential emergency and should be followed up as soon as possible or go to the Emergency Department if any problems should occur.  Please show the CHEMOTHERAPY ALERT CARD or IMMUNOTHERAPY  ALERT CARD at check-in to the Emergency Department and triage nurse.  Should you have questions after your visit or need to cancel or reschedule your appointment, please contact [facility] - A DEPT OF [facility]  Dept: [phone]  and follow the prompts.  Office hours are 8:00 a.m. to 4:30 p.m. Monday - Friday. Please note that voicemails left after 4:00 p.m. may not be returned until the following business day.  We are closed weekends and major holidays. You have access to a nurse at all times for urgent questions. Please call the main number to the clinic Dept: [phone] and follow the prompts.   For any non-urgent questions, you may also contact your provider using MyChart. We now offer e-Visits for anyone 49 and older to request care online for non-urgent symptoms. For details visit [email or web address].   Also download the MyChart app! Go to the app store, search "MyChart", open the app, select [facility] [place], and log in with your MyChart username and password.

## 2024-06-26 MED FILL — Dexamethasone Sodium Phosphate Inj 100 MG/10ML: INTRAMUSCULAR | Qty: 4 | Status: AC

## 2024-06-26 NOTE — Progress Notes (Unsigned)
 [facility] [phone]   [phone]  PROGRESS NOTE  Patient Care Team: Cloria Annabella CROME, DO as PCP - General (Geriatric Medicine) Babs Arthea DASEN, MD as Consulting Physician (Physical Medicine and Rehabilitation) Pegge Toribio PARAS, PA-C as Physician Assistant (Physical Medicine and Rehabilitation) Federico Norleen DASEN MADISON, MD as Consulting Physician (Hematology and Oncology) Federico Norleen DASEN MADISON, MD as Consulting Physician (Hematology and Oncology) Federico Norleen DASEN MADISON, MD as Consulting Physician (Hematology and Oncology)  Hematological/Oncological History # IgG Lambda Multiple Myeloma, Relapsed (ISS Stage II) 1) 06/2010: initial diagnosis of Multiple Myeloma after T8 compression fracture. Treated with Velcade /Revlimid /Dexamethasone  and achieved a complete remission 2) Velcade  was discontinued in September 2012 and that Revlimid  and Decadron  were discontinued in March 2013. 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 4) Followed by Dr. Lonn, last clinic visit 10/09/2019. At that time there was concern for relapse of his multiple myeloma.  5) Patient requested transfer to different provider after misunderstanding regarding imaging studies 6) 12/17/2019: transfer care to Dr. Federico  7) 01/09/2020: Cycle 1 Day 1 of Dara/Velcade /Dex 8) 01/21/2020: presented as urgent visit for diarrhea and dehydration. Holding chemotherapy scheduled for 01/23/2020. 9) 01/30/2020: Resume dara/velcade /dex after resolution of diarrhea.  10) 02/13/2020: restaging labs show M protein 0.8, Kappa 4.5, lamba 17.2, ratio 0.26, urine M protein 53 (7.1%). All MM labs indicate improvement.  11) 03/10/2020: Cycle 4 Day 1 of Dara/Velcade /Dex. Transition to q 3 week daratumumab .  12) 06/04/2020:  Cycle 8 Day 1 of Dara/Velcade /Dex 13) 06/25/2020:  Cycle 9 Day 1 of Dara/Velcade /Dex 14) 07/22/2020: Cycle 10 Day  1 of  Dara//Dex 15) 08/18/2020: Cycle 11 Day 1 of Dara//Dex 16) 09/23/2020: Cycle 12 Day 1 of Dara//Dex 17) 10/22/2020: Cycle 13 Day 1 of Dara/Dex  18) 11/19/2020: Cycle 14 Day 1 of Dara/Dex  19) 12/17/2020: Cycle 15 Day 1 of Dara/Dex  20) 01/17/2021: Cycle 16 Day 1 of Dara/Dex  21) 02/11/2021: Cycle 17 Day 1 of Dara/Dex 22) 03/10/2021: Cycle 18 Day 1 of Dara/Dex  23) 04/08/2021: Cycle 19 Day 1 of Dara/Dex  24) 08/03/2021: Cycle 20 Day 1 of Dara/Dex (delayed due to scheduling error) 25) 09/02/2021: Cycle 21 Day 1 of Dara/Dex 26) 09/30/2021: Cycle 22 Day 1 of Dara/Dex 27) 10/28/2021: Cycle 23 Day 1 of Dara/Dex 28) 12/02/2021: Cycle 24 Day 1 of Dara/Dex 29) 12/30/2021: Cycle 25 Day 1 of Dara/Dex 30) 01/06/2022: Cycle 1 Day 1 of Kyprolis /Dex 31) 02/03/2022: Cycle 2 Day 1 of Kyprolis /Dex 32) 02/24/2022: Cycle 3 Day 1 of Kyprolis /Dex 33) 04/07/2022: Cycle 4 Day 1 of Kyprolis /Dex 34) 05/05/2022: Cycle 5 Day 1 of Kyprolis /Dex 35) 06/01/2022: Cycle 6 Day 1 of Kyprolis /Dex 36) 06/29/2022: Cycle 7 Day 1 of Kyprolis /Dex 37) 07/28/2022: Cycle 8 Day 1 of Kyprolis /Dex 38) 08/25/2022: Cycle 9 Day 1 of Kyprolis /Dex 39) 09/22/2022: Cycle 10 Day 1 of Kyprolis /Dex 40) 10/20/2022: Cycle 11 Day 1 of Kyprolis /Dex 41) 11/17/2022: Cycle 12 Day 1 of Kyprolis /Dex 42 ) 12/22/2022-01/08/2023: Admitted for fournier's gangrene of scrotum.  43) 01/26/2023: Cycle 13 Day 1 of Kyprolis /Dex 44) 02/16/2023: Cycle 14 Day 1 of Kyprolis /Dex 45) 03/16/2023:Cycle 15 Day 1 of Kyprolis /Dex 46) 04/13/2023: Cycle 16 Day 1 of Kyprolis /Dex 47) 05/11/2023: Cycle 17 Day 1 of Kyprolis /Dex 48) 06/07/2023: Cycle 18 Day 1 of Kyprolis /Dex 49) 07/06/2023: Cycle 19 Day 1 of Kyprolis /Dex 50) 08/02/2023: Cycle 20 Day 1 of Kyprolis /Dex 51) 08/31/2023: Cycle 21 Day 1 of  Kyprolis /Dex 52) 09/28/2023: Cycle 22 Day 1 of Kyprolis /Dex 53) 10/26/2023: Cycle 23 Day 1 of Kyprolis /Dex 54) 11/23/2023: Cycle 24 Day 1 of Kyprolis /Dex 55) 12/21/2023: Cycle 25 Day 1 of Kyprolis /Dex 56)  01/18/2024: Cycle 26 Day 1 of Kyprolis /Dex 57) 02/08/2024: Cycle 27 Day 1 of Kyprolis /Dex 58) 03/07/2024: Cycle 28 Day 1 of Kyprolis /Dex 59) 04/04/2024: Cycle 29 Day 1 of Kyprolis /Dex 60) 05/02/2024: Cycle 30 Day 1 Kyprolis /Dex 61) 05/30/2024: Cycle 31 Day 1 Kyprolis /Dex 62) 06/27/2024: Cycle 32 Day 1 Kyprolis /Dex   Interval History:  [name] Pass III 68 y.o. male with medical history significant for  IgG Lambda Multiple Myeloma who presents for a follow up visit. The patient's last visit was on 06/13/2024.  He presents today prior to treatment with Kyprolis /Dex. He is unaccompanied for this visit.   On exam today [name] had a great time celebrating Thanksgiving his family.  He continues to have stable energy and appetite.  He denies nausea, vomiting or bowel habit changes.  He denies any easy bruising or signs of active bleeding.  He continues to have good urine and stool output.  He denies fevers, chills, night sweats, shortness of breath, chest pain or cough.  He has no other complaints.  A 10 point ROS is below.    MEDICAL HISTORY:  Past Medical History:  Diagnosis Date   Adrenal insufficiency    on chronic dexamethasone    Anemia    Cancer (HCC)    Coagulopathy    on xeralto/ s/p DVT while on coumadin,  IVC in place   Diabetes mellitus without complication (HCC)    type 2   Glaucoma 12/22/2022   Gross hematuria 01/2013   post foley cath procedure   History of blood transfusion 01/2013   Lower GI bleeding 04/18/2017   Multiple myeloma    thoracic T8 with paraplegia s/p resection- on chemo at visit 10/13/10   Multiple myeloma    Multiple myeloma without mention of remission    Neurogenic bladder    Neurogenic bowel    OSA (obstructive sleep apnea) 11/01/2022   Paraplegia (HCC)    Partial small bowel obstruction (HCC) during dec 2011 admission    SURGICAL HISTORY: Past Surgical History:  Procedure Laterality Date   COLONOSCOPY WITH PROPOFOL  N/A 04/12/2017   Procedure:  COLONOSCOPY WITH PROPOFOL ;  Surgeon: Gary Norleen SAILOR, MD;  Location: [facility];  Service: Endoscopy;  Laterality: N/A;   COLONOSCOPY WITH PROPOFOL  N/A 04/19/2017   Procedure: COLONOSCOPY WITH PROPOFOL ;  Surgeon: Leigh Elspeth SQUIBB, MD;  Location: [facility];  Service: Gastroenterology;  Laterality: N/A;   COLOSTOMY  07/20/2011   Procedure: COLOSTOMY;  Surgeon: Redell Alm Faith, DO;  Location: [facility] OR;  Service: General;;   COLOSTOMY REVISION  07/20/2011   Procedure: COLON RESECTION SIGMOID;  Surgeon: Redell Alm Faith, DO;  Location: [facility] OR;  Service: General;;   CYSTOSCOPY N/A 04/04/2013   Procedure: CYSTOSCOPY WITH LITHALOPAXY;  Surgeon: Ricardo Likens, MD;  Location: [facility];  Service: Urology;  Laterality: N/A;   INCISION AND DRAINAGE ABSCESS N/A 12/25/2022   Procedure: INCISION AND DRAINAGE OF SCROTUM;  Surgeon: Lovie Arlyss CROME, MD;  Location: [facility];  Service: Urology;  Laterality: N/A;   INSERTION OF SUPRAPUBIC CATHETER N/A 04/04/2013   Procedure: INSERTION OF SUPRAPUBIC CATHETER;  Surgeon: Ricardo Likens, MD;  Location: [facility];  Service: Urology;  Laterality: N/A;   LAPAROTOMY  07/20/2011   Procedure: EXPLORATORY LAPAROTOMY;  Surgeon: Redell Alm Faith, DO;  Location: [facility] OR;  Service:  General;  Laterality: N/A;   myeloma thoracic T8 with parpaplegia s/p thoracotomy and thoracic T7-9 cage placement on Dec 26th 2011  07/18/10   SCROTAL EXPLORATION N/A 12/29/2022   Procedure: SCROTAL WOUND DEBRIDEMENT AND CLOSURE;  Surgeon: Alvaro Ricardo KATHEE Mickey., MD;  Location: [facility];  Service: Urology;  Laterality: N/A;    SOCIAL HISTORY: Social History   Socioeconomic History   Marital status: Married    Spouse name: Not on file   Number of children: Not on file   Years of education: Not on file   Highest education level: Not on file  Occupational History   Not on file  Tobacco Use   Smoking status: Never   Smokeless tobacco: Never  Vaping Use   Vaping status: Never Used  Substance and  Sexual Activity   Alcohol use: No   Drug use: No   Sexual activity: Never  Other Topics Concern   Not on file  Social History Narrative   Not on file   Social Drivers of Health   Financial Resource Strain: Not on file  Food Insecurity: No Food Insecurity (12/22/2022)   Hunger Vital Sign    Worried About Running Out of Food in the Last Year: Never true    Ran Out of Food in the Last Year: Never true  Transportation Needs: No Transportation Needs (12/22/2022)   PRAPARE - Administrator, Civil Service (Medical): No    Lack of Transportation (Non-Medical): No  Physical Activity: Not on file  Stress: Not on file  Social Connections: Not on file  Intimate Partner Violence: Not At Risk (12/22/2022)   Humiliation, Afraid, Rape, and Kick questionnaire    Fear of Current or Ex-Partner: No    Emotionally Abused: No    Physically Abused: No    Sexually Abused: No    FAMILY HISTORY: Family History  Problem Relation Age of Onset   Ovarian cancer Mother    Diabetes Father     ALLERGIES:  is allergic to ferumoxytol .  MEDICATIONS:  Current Outpatient Medications  Medication Sig Dispense Refill   acetaminophen  (TYLENOL ) 325 MG tablet Take 325-650 mg by mouth every 6 (six) hours as needed for headache or mild pain.     acetic acid 0.25 % irrigation Irrigate with 1 Application as directed See admin instructions. Instill 50 ml's, clamp for 10 minutes, then remove clamp and drain the bladder. Repeat 3 times a week- when not using the Renacidin irrigation     acyclovir  (ZOVIRAX ) 400 MG tablet Take 1 tablet (400 mg total) by mouth 2 (two) times daily. 60 tablet 2   albuterol  (VENTOLIN  HFA) 108 (90 Base) MCG/ACT inhaler Inhale 2 puffs into the lungs every 4 (four) hours as needed for wheezing or shortness of breath.     atorvastatin  (LIPITOR) 20 MG tablet Take 20 mg by mouth daily.     BENADRYL  ALLERGY 25 MG tablet Take 25 mg by mouth every 6 (six) hours as needed (for allergic  reactions).     BIOFREEZE 4 % GEL Apply 1 application  topically every 4 (four) hours as needed (for joint pain- to intact areas of the skin only).     Citric Ac-Gluconolact-Mg Carb (RENACIDIN IR) Irrigate with 30 mLs as directed See admin instructions. Instill 30 ml's, clamp for 10 minutes, then remove clamp and drain the bladder. Repeat 3 times a week- when not using the acetic acid irrigation     COSOPT  PF 2-0.5 % SOLN Place 1 drop  into both eyes 2 (two) times daily.     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     Emollient (EUCERIN EX) Apply 1 application  topically See admin instructions. Apply to affected area 2 times a day     FLOVENT HFA 110 MCG/ACT inhaler 2 each See admin instructions. 2 sprays to colostomy stoma prior to each colostomy bag change     guaifenesin  (ROBITUSSIN) 100 MG/5ML syrup Take 200 mg by mouth 4 (four) times daily as needed for cough.     iron  polysaccharides (NU-IRON ) 150 MG capsule Take 1 capsule (150 mg total) by mouth daily. 30 capsule 0   latanoprost  (XALATAN ) 0.005 % ophthalmic solution Place 1 drop into both eyes at bedtime.     Multiple Vitamin (MULTIVITAMIN WITH MINERALS) TABS tablet Take 1 tablet by mouth daily. 30 tablet 0   NON FORMULARY Apply 1 application  topically See admin instructions. Colo Plast paste- Apply as directed with each ostomy bag change     ondansetron  (ZOFRAN ) 4 MG tablet Take 1 tablet (4 mg) by mouth every 6 hours as needed for nausea. 20 tablet 0   rivaroxaban  (XARELTO ) 10 MG TABS tablet Take 1 tablet (10 mg total) by mouth daily with supper. (Patient taking differently: Take 10 mg by mouth daily.) 30 tablet 9   Skin Protectants, Misc. (MINERIN CREME) CREA Apply to affected areas 2 times daily as needed. 454 g 0   Zinc  Oxide (BALMEX EX) Apply 1 application  topically See admin instructions. Apply topically daily to affected sites     No current  facility-administered medications for this visit.   Facility-Administered Medications Ordered in Other Visits  Medication Dose Route Frequency Provider Last Rate Last Admin   sodium chloride  flush (NS) 0.9 % injection 10 mL  10 mL Intravenous PRN Lonn, Ni, MD        REVIEW OF SYSTEMS:   Constitutional: ( - ) fevers, ( - )  chills , ( - ) night sweats Eyes: ( - ) blurriness of vision, ( - ) double vision, ( - ) watery eyes Ears, nose, mouth, throat, and face: ( - ) mucositis, ( - ) sore throat Respiratory: ( - ) cough, ( - ) dyspnea, ( - ) wheezes Cardiovascular: ( - ) palpitation, ( - ) chest discomfort, ( - ) lower extremity swelling Gastrointestinal:  ( - ) nausea, ( - ) heartburn, ( - ) change in bowel habits Skin: ( - ) abnormal skin rashes Lymphatics: ( - ) new lymphadenopathy, ( - ) easy bruising Neurological: ( - ) numbness, ( - ) tingling, ( - ) new weaknesses Behavioral/Psych: ( - ) mood change, ( - ) new changes  All other systems were reviewed with the patient and are negative.  PHYSICAL EXAMINATION: ECOG PERFORMANCE STATUS: paraplegic.   Vitals:   06/27/24 1033  BP: 124/73  Pulse: 92  Resp: 16  Temp: (!) 97.2 F (36.2 C)  SpO2: 100%    Filed Weights   06/27/24 1033  Weight: 231 lb (104.8 kg)     GENERAL: well appearing middle aged African American male alert, no distress and comfortable SKIN: skin color, texture, turgor are normal, no rashes or significant lesions EYES: conjunctiva are pink and non-injected, sclera clear LUNGS: clear to auscultation and percussion with normal breathing effort HEART: regular rate & rhythm and no murmurs Musculoskeletal: no cyanosis of digits  and no clubbing  PSYCH: alert & oriented x 3, fluent speech NEURO: paraplegic, no use of LE bilaterally.   LABORATORY DATA:  I have reviewed the data as listed    Latest Ref Rng & Units 06/27/2024    9:41 AM 06/13/2024   10:26 AM 06/06/2024   10:31 AM  CBC  WBC 4.0 - 10.5 K/uL  7.6  7.3  7.6   Hemoglobin 13.0 - 17.0 g/dL 88.6  89.0  88.8   Hematocrit 39.0 - 52.0 % 33.2  31.6  31.8   Platelets 150 - 400 K/uL 355  161  142        Latest Ref Rng & Units 06/27/2024    9:41 AM 06/13/2024   10:26 AM 06/06/2024   10:31 AM  CMP  Glucose 70 - 99 mg/dL 836  889  94   BUN 8 - 23 mg/dL 7  15  14    Creatinine 0.61 - 1.24 mg/dL 9.27  9.20  9.26   Sodium 135 - 145 mmol/L 142  142  143   Potassium 3.5 - 5.1 mmol/L 3.6  3.6  3.7   Chloride 98 - 111 mmol/L 109  110  111   CO2 22 - 32 mmol/L 22  23  27    Calcium  8.9 - 10.3 mg/dL 9.2  9.5  9.4   Total Protein 6.5 - 8.1 g/dL 6.3  6.0  5.7   Total Bilirubin 0.0 - 1.2 mg/dL 0.5  0.7  0.7   Alkaline Phos 38 - 126 U/L 101  96  77   AST 15 - 41 U/L 20  17  13    ALT 0 - 44 U/L 17  18  12      Lab Results  Component Value Date   MPROTEIN 0.1 (H) 05/30/2024   MPROTEIN 0.1 (H) 05/02/2024   MPROTEIN 0.1 (H) 04/04/2024   Lab Results  Component Value Date   KPAFRELGTCHN 7.2 05/30/2024   KPAFRELGTCHN 7.2 05/02/2024   KPAFRELGTCHN 6.5 04/04/2024   LAMBDASER 5.6 (L) 05/30/2024   LAMBDASER 4.5 (L) 05/02/2024   LAMBDASER 4.2 (L) 04/04/2024   KAPLAMBRATIO 1.29 05/30/2024   KAPLAMBRATIO 1.60 05/02/2024   KAPLAMBRATIO 1.55 04/04/2024    RADIOGRAPHIC STUDIES: No results found.     ASSESSMENT & PLAN DAK SZUMSKI is a 68 y.o.  male with medical history significant for  IgG Lambda Multiple Myeloma who presents for a follow up visit.  # IgG Lambda Multiple Myeloma, Relapsed (ISS Stage II) --findings are most consistent with relapsed multiple myeloma. Patient previously successfully treated with Velcade /Rev/Dex and Daratumumab /Velcade /Dex. On 07/02/2020 he transitioned to monthly daratumumab  alone.  --due to to rise in M protein, switched to Kyprolis  and Dexamethasone  on 01/06/2022 Plan: --Due for Cycle 32, Day 1 of Kyprolis /Dex today  --Labs show WBC 7.6, Hgb 11.3, Plt 355, creatinine and LFTs are normal.  --Most recent  myeloma labs from 05/30/2024 shows M protein stable at 0.1 g/dL and kappa light chain 7.2, lambda light chain 5.6, ratio 1.29  --No clinical evidence of relapse at this time. --Proceed with treatment today without any dose modifications. --Continue with weekly labs plus Kyprolis /Dex treatment as scheduled  #Anemia: --Likely secondary to MM +/- iron  deficiency --Currently on PO iron  therapy daily --Will recheck iron  panel periodically.   #History of Sepsis 2/2 UTI and Fournier's gangrene of scrotum: --Hospitalized from 12/22/2022-01/08/2023. Underwent debridement of necrotic tissue. --Given PIP tazobactam and Zyvox  initially --Urine culture reveals Proteus mirabilis which is resistant to ciprofloxacin  and  nitrofurantoin --Blood cultures negative --D/C on remainder doxycycline  and Augmentin  which he completed on 01/07/2023.  --Evaluated by urology on 7/2 and ID on 7/3 with continued wound healing and no further d/c or evidence of infection.   #History of DVT --He had placement of IVC filter, remains on Xarelto . --Due to poor mobility, and lack of bleeding complications, recommend to continue on Xarelto  indefinitely. --caution if Plt count were to drop <50  # Supportive Care -- provided patient with an albuterol  inhaler (for use with daratumumab ) --acyclovir  400mg  BID for VZV prophylaxis --zofran  8mg  q8H PRN for nausea/vomiting  --Zometa  is being held in the setting of his prior episode of osteonecrosis of the jaw  No orders of the defined types were placed in this encounter.   All questions were answered. The patient knows to call the clinic with any problems, questions or concerns.  I have spent a total of 30 minutes minutes of face-to-face and non-face-to-face time, preparing to see the patient,  performing a medically appropriate examination, counseling and educating the patient, communicating with other health care professionals, documenting clinical information in the electronic  health record,  and care coordination.   Johnston Police PA-C Dept of Hematology and Oncology [facility] at [facility] Phone: [phone]     06/27/2024 11:07 AM   Literature Support:  Bringhen S, Mina R, Petrucci MT, Gaidano G, Ballanti S, Musto P, [place], Spada S, Benevolo G, Ponticelli E, Galieni P, Cavo M, Di Toritto TC, Di Raimondo F, Montefusco V, Palumbo A, Boccadoro M, Larocca A. Once-weekly versus twice-weekly carfilzomib  in patients with newly diagnosed multiple myeloma: a pooled analysis of two phase I/II studies. Haematologica. 2019 Aug;104(8):1640-1647.  --Once-weekly 70 mg/m2 carfilzomib  as induction and maintenance therapy for newly diagnosed multiple myeloma patients was as safe and effective as twice-weekly 36 mg/m2 carfilzomib  and provided a more convenient schedule.

## 2024-06-27 ENCOUNTER — Inpatient Hospital Stay: Payer: Medicare (Managed Care)

## 2024-06-27 ENCOUNTER — Inpatient Hospital Stay: Payer: Medicare (Managed Care) | Attending: Physician Assistant

## 2024-06-27 ENCOUNTER — Inpatient Hospital Stay: Payer: Medicare (Managed Care) | Attending: Physician Assistant | Admitting: Physician Assistant

## 2024-06-27 ENCOUNTER — Encounter: Payer: Self-pay | Admitting: Hematology and Oncology

## 2024-06-27 VITALS — BP 124/73 | HR 92 | Temp 97.2°F | Resp 16 | Wt 231.0 lb

## 2024-06-27 DIAGNOSIS — Z86718 Personal history of other venous thrombosis and embolism: Secondary | ICD-10-CM | POA: Insufficient documentation

## 2024-06-27 DIAGNOSIS — Z5111 Encounter for antineoplastic chemotherapy: Secondary | ICD-10-CM

## 2024-06-27 DIAGNOSIS — D649 Anemia, unspecified: Secondary | ICD-10-CM | POA: Insufficient documentation

## 2024-06-27 DIAGNOSIS — C9002 Multiple myeloma in relapse: Secondary | ICD-10-CM | POA: Insufficient documentation

## 2024-06-27 DIAGNOSIS — Z7901 Long term (current) use of anticoagulants: Secondary | ICD-10-CM | POA: Insufficient documentation

## 2024-06-27 DIAGNOSIS — Z8619 Personal history of other infectious and parasitic diseases: Secondary | ICD-10-CM | POA: Diagnosis not present

## 2024-06-27 LAB — CMP (CANCER CENTER ONLY)
ALT: 17 U/L (ref 0–44)
AST: 20 U/L (ref 15–41)
Albumin: 4 g/dL (ref 3.5–5.0)
Alkaline Phosphatase: 101 U/L (ref 38–126)
Anion gap: 11 (ref 5–15)
BUN: 7 mg/dL — ABNORMAL LOW (ref 8–23)
CO2: 22 mmol/L (ref 22–32)
Calcium: 9.2 mg/dL (ref 8.9–10.3)
Chloride: 109 mmol/L (ref 98–111)
Creatinine: 0.72 mg/dL (ref 0.61–1.24)
GFR, Estimated: >60 mL/min (ref >60)
Glucose, Bld: 163 mg/dL — ABNORMAL HIGH (ref 70–99)
Potassium: 3.6 mmol/L (ref 3.5–5.1)
Sodium: 142 mmol/L (ref 135–145)
Total Bilirubin: 0.5 mg/dL (ref 0.0–1.2)
Total Protein: 6.3 g/dL — ABNORMAL LOW (ref 6.5–8.1)

## 2024-06-27 LAB — CBC WITH DIFFERENTIAL (CANCER CENTER ONLY)
Abs Immature Granulocytes: 0.05 K/uL (ref 0.00–0.07)
Basophils Absolute: 0.1 K/uL (ref 0.0–0.1)
Basophils Relative: 1 %
Eosinophils Absolute: 0.3 K/uL (ref 0.0–0.5)
Eosinophils Relative: 4 %
HCT: 33.2 % — ABNORMAL LOW (ref 39.0–52.0)
Hemoglobin: 11.3 g/dL — ABNORMAL LOW (ref 13.0–17.0)
Immature Granulocytes: 1 %
Lymphocytes Relative: 11 %
Lymphs Abs: 0.9 K/uL (ref 0.7–4.0)
MCH: 27.4 pg (ref 26.0–34.0)
MCHC: 34 g/dL (ref 30.0–36.0)
MCV: 80.4 fL (ref 80.0–100.0)
Monocytes Absolute: 0.7 K/uL (ref 0.1–1.0)
Monocytes Relative: 9 %
Neutro Abs: 5.7 K/uL (ref 1.7–7.7)
Neutrophils Relative %: 74 %
Platelet Count: 355 K/uL (ref 150–400)
RBC: 4.13 MIL/uL — ABNORMAL LOW (ref 4.22–5.81)
RDW: 17.9 % — ABNORMAL HIGH (ref 11.5–15.5)
WBC Count: 7.6 K/uL (ref 4.0–10.5)
nRBC: 0 % (ref 0.0–0.2)

## 2024-06-27 MED ORDER — DEXTROSE 5 % IV SOLN
70.0000 mg/m2 | Freq: Once | INTRAVENOUS | Status: AC
  Administered 2024-06-27: 150 mg via INTRAVENOUS
  Filled 2024-06-27: qty 60

## 2024-06-27 MED ORDER — SODIUM CHLORIDE 0.9 % IV SOLN
40.0000 mg | Freq: Once | INTRAVENOUS | Status: AC
  Administered 2024-06-27: 40 mg via INTRAVENOUS
  Filled 2024-06-27: qty 4

## 2024-06-27 MED ORDER — SODIUM CHLORIDE 0.9 % IV SOLN: Freq: Once | INTRAVENOUS | Status: DC

## 2024-06-27 MED ORDER — SODIUM CHLORIDE 0.9 % IV SOLN: Freq: Once | INTRAVENOUS | Status: AC

## 2024-06-27 NOTE — Patient Instructions (Signed)
 [facility] - A DEPT OF [facility]  Discharge Instructions: Thank you for choosing [facility] to provide your oncology and hematology care.   If you have a lab appointment with the Cancer Center, please go directly to the Cancer Center and check in at the registration area.   Wear comfortable clothing and clothing appropriate for easy access to any Portacath or PICC line.   We strive to give you quality time with your provider. You may need to reschedule your appointment if you arrive late (15 or more minutes).  Arriving late affects you and other patients whose appointments are after yours.  Also, if you miss three or more appointments without notifying the office, you may be dismissed from the clinic at the provider's discretion.      For prescription refill requests, have your pharmacy contact our office and allow 72 hours for refills to be completed.    Today you received the following chemotherapy and/or immunotherapy agents Kyprolis      To help prevent nausea and vomiting after your treatment, we encourage you to take your nausea medication as directed.  BELOW ARE SYMPTOMS THAT SHOULD BE REPORTED IMMEDIATELY: *FEVER GREATER THAN 100.4 F (38 C) OR HIGHER *CHILLS OR SWEATING *NAUSEA AND VOMITING THAT IS NOT CONTROLLED WITH YOUR NAUSEA MEDICATION *UNUSUAL SHORTNESS OF BREATH *UNUSUAL BRUISING OR BLEEDING *URINARY PROBLEMS (pain or burning when urinating, or frequent urination) *BOWEL PROBLEMS (unusual diarrhea, constipation, pain near the anus) TENDERNESS IN MOUTH AND THROAT WITH OR WITHOUT PRESENCE OF ULCERS (sore throat, sores in mouth, or a toothache) UNUSUAL RASH, SWELLING OR PAIN  UNUSUAL VAGINAL DISCHARGE OR ITCHING   Items with * indicate a potential emergency and should be followed up as soon as possible or go to the Emergency Department if any problems should occur.  Please show the CHEMOTHERAPY ALERT CARD or IMMUNOTHERAPY  ALERT CARD at check-in to the Emergency Department and triage nurse.  Should you have questions after your visit or need to cancel or reschedule your appointment, please contact [facility] - A DEPT OF [facility]  Dept: [phone]  and follow the prompts.  Office hours are 8:00 a.m. to 4:30 p.m. Monday - Friday. Please note that voicemails left after 4:00 p.m. may not be returned until the following business day.  We are closed weekends and major holidays. You have access to a nurse at all times for urgent questions. Please call the main number to the clinic Dept: [phone] and follow the prompts.   For any non-urgent questions, you may also contact your provider using MyChart. We now offer e-Visits for anyone 49 and older to request care online for non-urgent symptoms. For details visit [email or web address].   Also download the MyChart app! Go to the app store, search "MyChart", open the app, select [facility] [place], and log in with your MyChart username and password.

## 2024-06-30 LAB — KAPPA/LAMBDA LIGHT CHAINS
Kappa free light chain: 7 mg/L (ref 3.3–19.4)
Kappa, lambda light chain ratio: 1.04 (ref 0.26–1.65)
Lambda free light chains: 6.7 mg/L (ref 5.7–26.3)

## 2024-07-01 LAB — MULTIPLE MYELOMA PANEL, SERUM
Albumin SerPl Elph-Mcnc: 3.1 g/dL (ref 2.9–4.4)
Albumin/Glob SerPl: 1.3 (ref 0.7–1.7)
Alpha 1: 0.3 g/dL (ref 0.0–0.4)
Alpha2 Glob SerPl Elph-Mcnc: 0.8 g/dL (ref 0.4–1.0)
B-Globulin SerPl Elph-Mcnc: 1 g/dL (ref 0.7–1.3)
Gamma Glob SerPl Elph-Mcnc: 0.4 g/dL (ref 0.4–1.8)
Globulin, Total: 2.5 g/dL (ref 2.2–3.9)
IgA: 37 mg/dL — ABNORMAL LOW (ref 61–437)
IgG (Immunoglobin G), Serum: 437 mg/dL — ABNORMAL LOW (ref 603–1613)
IgM (Immunoglobulin M), Srm: 12 mg/dL — ABNORMAL LOW (ref 20–172)
M Protein SerPl Elph-Mcnc: 0.1 g/dL — ABNORMAL HIGH
Total Protein ELP: 5.6 g/dL — ABNORMAL LOW (ref 6.0–8.5)

## 2024-07-04 ENCOUNTER — Inpatient Hospital Stay: Payer: Medicare (Managed Care)

## 2024-07-04 VITALS — BP 155/72 | HR 70 | Temp 99.0°F | Resp 16 | Wt 251.0 lb

## 2024-07-04 DIAGNOSIS — C9002 Multiple myeloma in relapse: Secondary | ICD-10-CM

## 2024-07-04 DIAGNOSIS — Z5111 Encounter for antineoplastic chemotherapy: Secondary | ICD-10-CM | POA: Diagnosis not present

## 2024-07-04 LAB — CMP (CANCER CENTER ONLY)
ALT: 18 U/L (ref 0–44)
AST: 19 U/L (ref 15–41)
Albumin: 3.9 g/dL (ref 3.5–5.0)
Alkaline Phosphatase: 101 U/L (ref 38–126)
Anion gap: 10 (ref 5–15)
BUN: 14 mg/dL (ref 8–23)
CO2: 25 mmol/L (ref 22–32)
Calcium: 9.8 mg/dL (ref 8.9–10.3)
Chloride: 110 mmol/L (ref 98–111)
Creatinine: 0.84 mg/dL (ref 0.61–1.24)
GFR, Estimated: >60 mL/min (ref >60)
Glucose, Bld: 116 mg/dL — ABNORMAL HIGH (ref 70–99)
Potassium: 3.6 mmol/L (ref 3.5–5.1)
Sodium: 145 mmol/L (ref 135–145)
Total Bilirubin: 0.7 mg/dL (ref 0.0–1.2)
Total Protein: 6.3 g/dL — ABNORMAL LOW (ref 6.5–8.1)

## 2024-07-04 LAB — CBC WITH DIFFERENTIAL (CANCER CENTER ONLY)
Abs Immature Granulocytes: 0.02 K/uL (ref 0.00–0.07)
Basophils Absolute: 0 K/uL (ref 0.0–0.1)
Basophils Relative: 0 %
Eosinophils Absolute: 0.3 K/uL (ref 0.0–0.5)
Eosinophils Relative: 4 %
HCT: 34.8 % — ABNORMAL LOW (ref 39.0–52.0)
Hemoglobin: 11.8 g/dL — ABNORMAL LOW (ref 13.0–17.0)
Immature Granulocytes: 0 %
Lymphocytes Relative: 11 %
Lymphs Abs: 0.8 K/uL (ref 0.7–4.0)
MCH: 27.4 pg (ref 26.0–34.0)
MCHC: 33.9 g/dL (ref 30.0–36.0)
MCV: 80.7 fL (ref 80.0–100.0)
Monocytes Absolute: 0.8 K/uL (ref 0.1–1.0)
Monocytes Relative: 11 %
Neutro Abs: 5.4 K/uL (ref 1.7–7.7)
Neutrophils Relative %: 74 %
Platelet Count: 152 K/uL (ref 150–400)
RBC: 4.31 MIL/uL (ref 4.22–5.81)
RDW: 18.3 % — ABNORMAL HIGH (ref 11.5–15.5)
WBC Count: 7.4 K/uL (ref 4.0–10.5)
nRBC: 0 % (ref 0.0–0.2)

## 2024-07-04 MED ORDER — SODIUM CHLORIDE 0.9% FLUSH: 10.0000 mL | INTRAVENOUS | Status: DC | PRN

## 2024-07-04 MED ORDER — SODIUM CHLORIDE 0.9 % IV SOLN: Freq: Once | INTRAVENOUS | Status: AC

## 2024-07-04 MED ORDER — DEXTROSE 5 % IV SOLN
70.0000 mg/m2 | Freq: Once | INTRAVENOUS | Status: AC
  Administered 2024-07-04: 150 mg via INTRAVENOUS
  Filled 2024-07-04: qty 60

## 2024-07-04 MED ORDER — SODIUM CHLORIDE 0.9 % IV SOLN
40.0000 mg | Freq: Once | INTRAVENOUS | Status: DC
  Filled 2024-07-04: qty 4

## 2024-07-04 MED ORDER — SODIUM CHLORIDE 0.9 % IV SOLN
40.0000 mg | Freq: Once | INTRAVENOUS | Status: AC
  Administered 2024-07-04: 40 mg via INTRAVENOUS
  Filled 2024-07-04: qty 4

## 2024-07-10 MED FILL — Dexamethasone Sodium Phosphate Inj 100 MG/10ML: INTRAMUSCULAR | Qty: 4 | Status: AC

## 2024-07-11 ENCOUNTER — Inpatient Hospital Stay: Payer: Self-pay

## 2024-07-11 ENCOUNTER — Inpatient Hospital Stay: Payer: Self-pay | Admitting: Hematology and Oncology

## 2024-07-11 VITALS — BP 160/70 | HR 64 | Temp 97.3°F | Resp 13 | Wt 235.6 lb

## 2024-07-11 VITALS — BP 159/78 | HR 88 | Resp 16

## 2024-07-11 DIAGNOSIS — Z95828 Presence of other vascular implants and grafts: Secondary | ICD-10-CM

## 2024-07-11 DIAGNOSIS — C9002 Multiple myeloma in relapse: Secondary | ICD-10-CM | POA: Diagnosis not present

## 2024-07-11 DIAGNOSIS — Z5111 Encounter for antineoplastic chemotherapy: Secondary | ICD-10-CM

## 2024-07-11 LAB — CBC WITH DIFFERENTIAL (CANCER CENTER ONLY)
Abs Immature Granulocytes: 0.07 K/uL (ref 0.00–0.07)
Basophils Absolute: 0 K/uL (ref 0.0–0.1)
Basophils Relative: 0 %
Eosinophils Absolute: 0.3 K/uL (ref 0.0–0.5)
Eosinophils Relative: 3 %
HCT: 31 % — ABNORMAL LOW (ref 39.0–52.0)
Hemoglobin: 10.7 g/dL — ABNORMAL LOW (ref 13.0–17.0)
Immature Granulocytes: 1 %
Lymphocytes Relative: 10 %
Lymphs Abs: 0.9 K/uL (ref 0.7–4.0)
MCH: 27.8 pg (ref 26.0–34.0)
MCHC: 34.5 g/dL (ref 30.0–36.0)
MCV: 80.5 fL (ref 80.0–100.0)
Monocytes Absolute: 1 K/uL (ref 0.1–1.0)
Monocytes Relative: 12 %
Neutro Abs: 6.3 K/uL (ref 1.7–7.7)
Neutrophils Relative %: 74 %
Platelet Count: 143 K/uL — ABNORMAL LOW (ref 150–400)
RBC: 3.85 MIL/uL — ABNORMAL LOW (ref 4.22–5.81)
RDW: 18.5 % — ABNORMAL HIGH (ref 11.5–15.5)
WBC Count: 8.5 K/uL (ref 4.0–10.5)
nRBC: 0 % (ref 0.0–0.2)

## 2024-07-11 LAB — CMP (CANCER CENTER ONLY)
ALT: 22 U/L (ref 0–44)
AST: 17 U/L (ref 15–41)
Albumin: 3.7 g/dL (ref 3.5–5.0)
Alkaline Phosphatase: 97 U/L (ref 38–126)
Anion gap: 9 (ref 5–15)
BUN: 14 mg/dL (ref 8–23)
CO2: 24 mmol/L (ref 22–32)
Calcium: 9.5 mg/dL (ref 8.9–10.3)
Chloride: 113 mmol/L — ABNORMAL HIGH (ref 98–111)
Creatinine: 0.75 mg/dL (ref 0.61–1.24)
GFR, Estimated: >60 mL/min
Glucose, Bld: 104 mg/dL — ABNORMAL HIGH (ref 70–99)
Potassium: 3.7 mmol/L (ref 3.5–5.1)
Sodium: 145 mmol/L (ref 135–145)
Total Bilirubin: 0.5 mg/dL (ref 0.0–1.2)
Total Protein: 5.8 g/dL — ABNORMAL LOW (ref 6.5–8.1)

## 2024-07-11 MED ORDER — SODIUM CHLORIDE 0.9 % IV SOLN
40.0000 mg | Freq: Once | INTRAVENOUS | Status: AC
  Administered 2024-07-11: 40 mg via INTRAVENOUS
  Filled 2024-07-11: qty 4

## 2024-07-11 MED ORDER — SODIUM CHLORIDE 0.9 % IV SOLN: Freq: Once | INTRAVENOUS | Status: AC

## 2024-07-11 MED ORDER — SODIUM CHLORIDE 0.9 % IV SOLN: Freq: Once | INTRAVENOUS | Status: DC

## 2024-07-11 MED ORDER — DEXTROSE 5 % IV SOLN
70.0000 mg/m2 | Freq: Once | INTRAVENOUS | Status: AC
  Administered 2024-07-11: 150 mg via INTRAVENOUS
  Filled 2024-07-11: qty 60

## 2024-07-11 NOTE — Progress Notes (Signed)
 " [facility] [phone]   [phone]  PROGRESS NOTE  Patient Care Team: Gary Annabella CROME, DO as PCP - General (Geriatric Medicine) Gary Arthea DASEN, MD as Consulting Physician (Physical Medicine and Rehabilitation) Gary Toribio PARAS, PA-C as Physician Assistant (Physical Medicine and Rehabilitation) Gary Norleen Howell MADISON, MD as Consulting Physician (Hematology and Oncology) Gary Norleen Howell MADISON, MD as Consulting Physician (Hematology and Oncology) Gary Norleen Howell MADISON, MD as Consulting Physician (Hematology and Oncology)  Hematological/Oncological History # IgG Lambda Multiple Myeloma, Relapsed (ISS Stage II) 1) 06/2010: initial diagnosis of Multiple Myeloma after T8 compression fracture. Treated with Velcade /Revlimid /Dexamethasone  and achieved a complete remission 2) Velcade  was discontinued in September 2012 and that Revlimid  and Decadron  were discontinued in March 2013. 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 4) Followed by Dr. Lonn, last clinic visit 10/09/2019. At that time there was concern for relapse of his multiple myeloma.  5) Patient requested transfer to different provider after misunderstanding regarding imaging studies 6) 12/17/2019: transfer care to Dr. Federico  7) 01/09/2020: Cycle 1 Day 1 of Dara/Velcade /Dex 8) 01/21/2020: presented as urgent visit for diarrhea and dehydration. Holding chemotherapy scheduled for 01/23/2020. 9) 01/30/2020: Resume dara/velcade /dex after resolution of diarrhea.  10) 02/13/2020: restaging labs show M protein 0.8, Kappa 4.5, lamba 17.2, ratio 0.26, urine M protein 53 (7.1%). All MM labs indicate improvement.  11) 03/10/2020: Cycle 4 Day 1 of Dara/Velcade /Dex. Transition to q 3 week daratumumab .  12) 06/04/2020:  Cycle 8 Day 1 of Dara/Velcade /Dex 13) 06/25/2020:  Cycle 9 Day 1 of Dara/Velcade /Dex 14) 07/22/2020: Cycle 10 Day  1 of  Dara//Dex 15) 08/18/2020: Cycle 11 Day 1 of Dara//Dex 16) 09/23/2020: Cycle 12 Day 1 of Dara//Dex 17) 10/22/2020: Cycle 13 Day 1 of Dara/Dex  18) 11/19/2020: Cycle 14 Day 1 of Dara/Dex  19) 12/17/2020: Cycle 15 Day 1 of Dara/Dex  20) 01/17/2021: Cycle 16 Day 1 of Dara/Dex  21) 02/11/2021: Cycle 17 Day 1 of Dara/Dex 22) 03/10/2021: Cycle 18 Day 1 of Dara/Dex  23) 04/08/2021: Cycle 19 Day 1 of Dara/Dex  24) 08/03/2021: Cycle 20 Day 1 of Dara/Dex (delayed due to scheduling error) 25) 09/02/2021: Cycle 21 Day 1 of Dara/Dex 26) 09/30/2021: Cycle 22 Day 1 of Dara/Dex 27) 10/28/2021: Cycle 23 Day 1 of Dara/Dex 28) 12/02/2021: Cycle 24 Day 1 of Dara/Dex 29) 12/30/2021: Cycle 25 Day 1 of Dara/Dex 30) 01/06/2022: Cycle 1 Day 1 of Kyprolis /Dex 31) 02/03/2022: Cycle 2 Day 1 of Kyprolis /Dex 32) 02/24/2022: Cycle 3 Day 1 of Kyprolis /Dex 33) 04/07/2022: Cycle 4 Day 1 of Kyprolis /Dex 34) 05/05/2022: Cycle 5 Day 1 of Kyprolis /Dex 35) 06/01/2022: Cycle 6 Day 1 of Kyprolis /Dex 36) 06/29/2022: Cycle 7 Day 1 of Kyprolis /Dex 37) 07/28/2022: Cycle 8 Day 1 of Kyprolis /Dex 38) 08/25/2022: Cycle 9 Day 1 of Kyprolis /Dex 39) 09/22/2022: Cycle 10 Day 1 of Kyprolis /Dex 40) 10/20/2022: Cycle 11 Day 1 of Kyprolis /Dex 41) 11/17/2022: Cycle 12 Day 1 of Kyprolis /Dex 42 ) 12/22/2022-01/08/2023: Admitted for fournier's gangrene of scrotum.  43) 01/26/2023: Cycle 13 Day 1 of Kyprolis /Dex 44) 02/16/2023: Cycle 14 Day 1 of Kyprolis /Dex 45) 03/16/2023:Cycle 15 Day 1 of Kyprolis /Dex 46) 04/13/2023: Cycle 16 Day 1 of Kyprolis /Dex 47) 05/11/2023: Cycle 17 Day 1 of Kyprolis /Dex 48) 06/07/2023: Cycle 18 Day 1 of Kyprolis /Dex 49) 07/06/2023: Cycle 19 Day 1 of Kyprolis /Dex 50) 08/02/2023: Cycle 20 Day 1 of Kyprolis /Dex 51) 08/31/2023: Cycle 21 Day 1  of Kyprolis /Dex 52) 09/28/2023: Cycle 22 Day 1 of Kyprolis /Dex 53) 10/26/2023: Cycle 23 Day 1 of Kyprolis /Dex 54) 11/23/2023: Cycle 24 Day 1 of Kyprolis /Dex 55) 12/21/2023: Cycle 25 Day 1 of Kyprolis /Dex 56)  01/18/2024: Cycle 26 Day 1 of Kyprolis /Dex 57) 02/08/2024: Cycle 27 Day 1 of Kyprolis /Dex 58) 03/07/2024: Cycle 28 Day 1 of Kyprolis /Dex 59) 04/04/2024: Cycle 29 Day 1 of Kyprolis /Dex 60) 05/02/2024: Cycle 30 Day 1 Kyprolis /Dex 61) 05/30/2024: Cycle 31 Day 1 Kyprolis /Dex 62) 06/27/2024: Cycle 32 Day 1 Kyprolis /Dex   Interval History:  [name] 68 y.o. male with medical history significant for  IgG Lambda Multiple Myeloma who presents for a follow up visit. The patient's last visit was on 06/27/2024.  He presents today prior to treatment with Kyprolis /Dex. He is unaccompanied for this visit.   On exam today [name] reports that he is in the midst of celebrations.  He is celebrate his anniversary at [employer] and additionally has begun celebrating his birthday.  He reports much of his family be coming in from out of town to celebrate this and Christmas.  He is very excited.  He reports his energy levels are strong and his appetite is good.  He is tolerating his chemotherapy well with no major side effects.  He is not having any trouble with nausea, vomiting, or diarrhea.  He reports that he is not having any numbness or tingling of his fingers or toes.  Overall he feels well and is willing and able to proceed with chemotherapy today.  A full 10 point ROS is otherwise negative.   MEDICAL HISTORY:  Past Medical History:  Diagnosis Date   Adrenal insufficiency    on chronic dexamethasone    Anemia    Cancer (HCC)    Coagulopathy    on xeralto/ s/p DVT while on coumadin,  IVC in place   Diabetes mellitus without complication (HCC)    type 2   Glaucoma 12/22/2022   Gross hematuria 01/2013   post foley cath procedure   History of blood transfusion 01/2013   Lower GI bleeding 04/18/2017   Multiple myeloma    thoracic T8 with paraplegia s/p resection- on chemo at visit 10/13/10   Multiple myeloma    Multiple myeloma without mention of remission    Neurogenic bladder     Neurogenic bowel    OSA (obstructive sleep apnea) 11/01/2022   Paraplegia (HCC)    Partial small bowel obstruction (HCC) during dec 2011 admission    SURGICAL HISTORY: Past Surgical History:  Procedure Laterality Date   COLONOSCOPY WITH PROPOFOL  N/A 04/12/2017   Procedure: COLONOSCOPY WITH PROPOFOL ;  Surgeon: Abran Norleen SAILOR, MD;  Location: [facility];  Service: Endoscopy;  Laterality: N/A;   COLONOSCOPY WITH PROPOFOL  N/A 04/19/2017   Procedure: COLONOSCOPY WITH PROPOFOL ;  Surgeon: Leigh Elspeth SQUIBB, MD;  Location: [facility];  Service: Gastroenterology;  Laterality: N/A;   COLOSTOMY  07/20/2011   Procedure: COLOSTOMY;  Surgeon: Redell Alm Faith, DO;  Location[facility];  Service: General;;   COLOSTOMY REVISION  07/20/2011   Procedure: COLON RESECTION SIGMOID;  Surgeon: Redell Alm Faith, DO;  Location: [facility] OR;  Service: General;;   CYSTOSCOPY N/A 04/04/2013   Procedure: CYSTOSCOPY WITH LITHALOPAXY;  Surgeon: Ricardo Likens, MD;  Location: [facility];  Service: Urology;  Laterality: N/A;   INCISION AND DRAINAGE ABSCESS N/A 12/25/2022   Procedure: INCISION AND DRAINAGE OF SCROTUM;  Surgeon: Lovie Arlyss CROME, MD;  Location: [facility];  Service: Urology;  Laterality: N/A;   INSERTION OF SUPRAPUBIC CATHETER N/A 04/04/2013   Procedure: INSERTION OF SUPRAPUBIC CATHETER;  Surgeon: Ricardo Likens, MD;  Location: [facility];  Service: Urology;  Laterality: N/A;   LAPAROTOMY  07/20/2011   Procedure: EXPLORATORY LAPAROTOMY;  Surgeon: Redell Alm Faith, DO;  Location: [facility] OR;  Service: General;  Laterality: N/A;   myeloma thoracic T8 with parpaplegia s/p thoracotomy and thoracic T7-9 cage placement on Dec 26th 2011  07/18/10   SCROTAL EXPLORATION N/A 12/29/2022   Procedure: SCROTAL WOUND DEBRIDEMENT AND CLOSURE;  Surgeon: Likens Ricardo KATHEE Mickey., MD;  Location: [facility];  Service: Urology;  Laterality: N/A;    SOCIAL HISTORY: Social History   Socioeconomic History   Marital status: Married    Spouse name: Not on  file   Number of children: Not on file   Years of education: Not on file   Highest education level: Not on file  Occupational History   Not on file  Tobacco Use   Smoking status: Never   Smokeless tobacco: Never  Vaping Use   Vaping status: Never Used  Substance and Sexual Activity   Alcohol use: No   Drug use: No   Sexual activity: Never  Other Topics Concern   Not on file  Social History Narrative   Not on file   Social Drivers of Health   Tobacco Use: Low Risk (08/20/2023)   Patient History    Smoking Tobacco Use: Never    Smokeless Tobacco Use: Never    Passive Exposure: Not on file  Financial Resource Strain: Not on file  Food Insecurity: No Food Insecurity (12/22/2022)   Hunger Vital Sign    Worried About Running Out of Food in the Last Year: Never true    Ran Out of Food in the Last Year: Never true  Transportation Needs: No Transportation Needs (12/22/2022)   PRAPARE - Administrator, Civil Service (Medical): No    Lack of Transportation (Non-Medical): No  Physical Activity: Not on file  Stress: Not on file  Social Connections: Not on file  Intimate Partner Violence: Not At Risk (12/22/2022)   Humiliation, Afraid, Rape, and Kick questionnaire    Fear of Current or Ex-Partner: No    Emotionally Abused: No    Physically Abused: No    Sexually Abused: No  Depression (PHQ2-9): Low Risk (07/04/2024)   Depression (PHQ2-9)    PHQ-2 Score: 0  Alcohol Screen: Not on file  Housing: Low Risk (12/22/2022)   Housing    Last Housing Risk Score: 0  Utilities: Not At Risk (12/22/2022)   AHC Utilities    Threatened with loss of utilities: No  Health Literacy: Not on file    FAMILY HISTORY: Family History  Problem Relation Age of Onset   Ovarian cancer Mother    Diabetes Father     ALLERGIES:  is allergic to ferumoxytol .  MEDICATIONS:  Current Outpatient Medications  Medication Sig Dispense Refill   acetaminophen  (TYLENOL ) 325 MG tablet Take 325-650 mg  by mouth every 6 (six) hours as needed for headache or mild pain.     acetic acid 0.25 % irrigation Irrigate with 1 Application as directed See admin instructions. Instill 50 ml's, clamp for 10 minutes, then remove clamp and drain the bladder. Repeat 3 times a week- when not using the Renacidin irrigation     acyclovir  (ZOVIRAX ) 400 MG tablet Take 1 tablet (400 mg total) by mouth 2 (two) times daily. 60 tablet 2  albuterol  (VENTOLIN  HFA) 108 (90 Base) MCG/ACT inhaler Inhale 2 puffs into the lungs every 4 (four) hours as needed for wheezing or shortness of breath.     atorvastatin  (LIPITOR) 20 MG tablet Take 20 mg by mouth daily.     BENADRYL  ALLERGY 25 MG tablet Take 25 mg by mouth every 6 (six) hours as needed (for allergic reactions).     BIOFREEZE 4 % GEL Apply 1 application  topically every 4 (four) hours as needed (for joint pain- to intact areas of the skin only).     Citric Ac-Gluconolact-Mg Carb (RENACIDIN IR) Irrigate with 30 mLs as directed See admin instructions. Instill 30 ml's, clamp for 10 minutes, then remove clamp and drain the bladder. Repeat 3 times a week- when not using the acetic acid irrigation     COSOPT  PF 2-0.5 % SOLN Place 1 drop into both eyes 2 (two) times daily.     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     Emollient (EUCERIN EX) Apply 1 application  topically See admin instructions. Apply to affected area 2 times a day     FLOVENT HFA 110 MCG/ACT inhaler 2 each See admin instructions. 2 sprays to colostomy stoma prior to each colostomy bag change     guaifenesin  (ROBITUSSIN) 100 MG/5ML syrup Take 200 mg by mouth 4 (four) times daily as needed for cough.     iron  polysaccharides (NU-IRON ) 150 MG capsule Take 1 capsule (150 mg total) by mouth daily. 30 capsule 0   latanoprost  (XALATAN ) 0.005 % ophthalmic solution Place 1 drop into both eyes at bedtime.     Multiple Vitamin (MULTIVITAMIN  WITH MINERALS) TABS tablet Take 1 tablet by mouth daily. 30 tablet 0   NON FORMULARY Apply 1 application  topically See admin instructions. Colo Plast paste- Apply as directed with each ostomy bag change     ondansetron  (ZOFRAN ) 4 MG tablet Take 1 tablet (4 mg) by mouth every 6 hours as needed for nausea. 20 tablet 0   rivaroxaban  (XARELTO ) 10 MG TABS tablet Take 1 tablet (10 mg total) by mouth daily with supper. (Patient taking differently: Take 10 mg by mouth daily.) 30 tablet 9   Skin Protectants, Misc. (MINERIN CREME) CREA Apply to affected areas 2 times daily as needed. 454 g 0   Zinc  Oxide (BALMEX EX) Apply 1 application  topically See admin instructions. Apply topically daily to affected sites     No current facility-administered medications for this visit.   Facility-Administered Medications Ordered in Other Visits  Medication Dose Route Frequency Provider Last Rate Last Admin   0.9 %  sodium chloride  infusion   Intravenous Once Neveyah Garzon T IV, MD       sodium chloride  flush (NS) 0.9 % injection 10 mL  10 mL Intravenous PRN Lonn, Ni, MD        REVIEW OF SYSTEMS:   Constitutional: ( - ) fevers, ( - )  chills , ( - ) night sweats Eyes: ( - ) blurriness of vision, ( - ) double vision, ( - ) watery eyes Ears, nose, mouth, throat, and face: ( - ) mucositis, ( - ) sore throat Respiratory: ( - ) cough, ( - ) dyspnea, ( - ) wheezes Cardiovascular: ( - ) palpitation, ( - ) chest discomfort, ( - ) lower extremity swelling Gastrointestinal:  ( - ) nausea, ( - ) heartburn, ( - )  change in bowel habits Skin: ( - ) abnormal skin rashes Lymphatics: ( - ) new lymphadenopathy, ( - ) easy bruising Neurological: ( - ) numbness, ( - ) tingling, ( - ) new weaknesses Behavioral/Psych: ( - ) mood change, ( - ) new changes  All other systems were reviewed with the patient and are negative.  PHYSICAL EXAMINATION: ECOG PERFORMANCE STATUS: paraplegic.   Vitals:   07/11/24 1105 07/11/24 1106  BP:  (!) 167/66 (!) 160/70  Pulse: 64   Resp: 13   Temp: (!) 97.3 F (36.3 C)   SpO2: 100%      Filed Weights   07/11/24 1105  Weight: 235 lb 9.6 oz (106.9 kg)      GENERAL: well appearing middle aged African American male alert, no distress and comfortable SKIN: skin color, texture, turgor are normal, no rashes or significant lesions EYES: conjunctiva are pink and non-injected, sclera clear LUNGS: clear to auscultation and percussion with normal breathing effort HEART: regular rate & rhythm and no murmurs Musculoskeletal: no cyanosis of digits and no clubbing  PSYCH: alert & oriented x 3, fluent speech NEURO: paraplegic, no use of LE bilaterally.   LABORATORY DATA:  I have reviewed the data as listed    Latest Ref Rng & Units 07/11/2024   10:19 AM 07/04/2024   10:39 AM 06/27/2024    9:41 AM  CBC  WBC 4.0 - 10.5 K/uL 8.5  7.4  7.6   Hemoglobin 13.0 - 17.0 g/dL 89.2  88.1  88.6   Hematocrit 39.0 - 52.0 % 31.0  34.8  33.2   Platelets 150 - 400 K/uL 143  152  355        Latest Ref Rng & Units 07/11/2024   10:19 AM 07/04/2024   10:39 AM 06/27/2024    9:41 AM  CMP  Glucose 70 - 99 mg/dL 895  883  836   BUN 8 - 23 mg/dL 14  14  7    Creatinine 0.61 - 1.24 mg/dL 9.24  9.15  9.27   Sodium 135 - 145 mmol/L 145  145  142   Potassium 3.5 - 5.1 mmol/L 3.7  3.6  3.6   Chloride 98 - 111 mmol/L 113  110  109   CO2 22 - 32 mmol/L 24  25  22    Calcium  8.9 - 10.3 mg/dL 9.5  9.8  9.2   Total Protein 6.5 - 8.1 g/dL 5.8  6.3  6.3   Total Bilirubin 0.0 - 1.2 mg/dL 0.5  0.7  0.5   Alkaline Phos 38 - 126 U/L 97  101  101   AST 15 - 41 U/L 17  19  20    ALT 0 - 44 U/L 22  18  17      Lab Results  Component Value Date   MPROTEIN 0.1 (H) 06/27/2024   MPROTEIN 0.1 (H) 05/30/2024   MPROTEIN 0.1 (H) 05/02/2024   Lab Results  Component Value Date   KPAFRELGTCHN 7.0 06/27/2024   KPAFRELGTCHN 7.2 05/30/2024   KPAFRELGTCHN 7.2 05/02/2024   LAMBDASER 6.7 06/27/2024   LAMBDASER 5.6 (L)  05/30/2024   LAMBDASER 4.5 (L) 05/02/2024   KAPLAMBRATIO 1.04 06/27/2024   KAPLAMBRATIO 1.29 05/30/2024   KAPLAMBRATIO 1.60 05/02/2024    RADIOGRAPHIC STUDIES: No results found.     ASSESSMENT & PLAN [name] is a 68 y.o.  male with medical history significant for  IgG Lambda Multiple Myeloma who presents for a follow up visit.  #  IgG Lambda Multiple Myeloma, Relapsed (ISS Stage II) --findings are most consistent with relapsed multiple myeloma. Patient previously successfully treated with Velcade /Rev/Dex and Daratumumab /Velcade /Dex. On 07/02/2020 he transitioned to monthly daratumumab  alone.  --due to to rise in M protein, switched to Kyprolis  and Dexamethasone  on 01/06/2022 Plan: --Due for Cycle 32, Day 15 of Kyprolis /Dex today  --Labs show WBC 8.5, hemoglobin 10.7, MCV 80.5, platelets 143 --Most recent myeloma labs from 05/30/2024 shows M protein stable at 0.1 g/dL and kappa light chain 7.2, lambda light chain 5.6, ratio 1.29  --No clinical evidence of relapse at this time. --Proceed with treatment today without any dose modifications. --Continue with weekly labs plus Kyprolis /Dex treatment as scheduled  #Anemia: --Likely secondary to MM +/- iron  deficiency --Currently on PO iron  therapy daily --Will recheck iron  panel periodically.   #History of Sepsis 2/2 UTI and Fournier's gangrene of scrotum: --Hospitalized from 12/22/2022-01/08/2023. Underwent debridement of necrotic tissue. --Given PIP tazobactam and Zyvox  initially --Urine culture reveals Proteus mirabilis which is resistant to ciprofloxacin  and nitrofurantoin --Blood cultures negative --D/C on remainder doxycycline  and Augmentin  which he completed on 01/07/2023.  --Evaluated by urology on 7/2 and ID on 7/3 with continued wound healing and no further d/c or evidence of infection.   #History of DVT --He had placement of IVC filter, remains on Xarelto . --Due to poor mobility, and lack of bleeding complications,  recommend to continue on Xarelto  indefinitely. --caution if Plt count were to drop <50  # Supportive Care --acyclovir  400mg  BID for VZV prophylaxis --zofran  8mg  q8H PRN for nausea/vomiting  --Zometa  is being held in the setting of his prior episode of osteonecrosis of the jaw  Orders Placed This Encounter  Procedures   Multiple Myeloma Panel (SPEP&IFE w/QIG)    Standing Status:   Future    Expected Date:   08/22/2024    Expiration Date:   08/22/2025   Kappa/lambda light chains    Standing Status:   Future    Expected Date:   08/22/2024    Expiration Date:   08/22/2025   CBC with Differential (Cancer Center Only)    Standing Status:   Future    Expected Date:   08/22/2024    Expiration Date:   08/22/2025   CMP (Cancer Center only)    Standing Status:   Future    Expected Date:   08/22/2024    Expiration Date:   08/22/2025   CBC with Differential (Cancer Center Only)    Standing Status:   Future    Expected Date:   08/29/2024    Expiration Date:   08/29/2025   CMP (Cancer Center only)    Standing Status:   Future    Expected Date:   08/29/2024    Expiration Date:   08/29/2025   CBC with Differential (Cancer Center Only)    Standing Status:   Future    Expected Date:   09/05/2024    Expiration Date:   09/05/2025   CMP (Cancer Center only)    Standing Status:   Future    Expected Date:   09/05/2024    Expiration Date:   09/05/2025   Multiple Myeloma Panel (SPEP&IFE w/QIG)    Standing Status:   Future    Expected Date:   09/19/2024    Expiration Date:   09/19/2025   Kappa/lambda light chains    Standing Status:   Future    Expected Date:   09/19/2024    Expiration Date:   09/19/2025   CBC with  Differential (Cancer Center Only)    Standing Status:   Future    Expected Date:   09/19/2024    Expiration Date:   09/19/2025   CMP (Cancer Center only)    Standing Status:   Future    Expected Date:   09/19/2024    Expiration Date:   09/19/2025   CBC with Differential (Cancer Center Only)     Standing Status:   Future    Expected Date:   09/26/2024    Expiration Date:   09/26/2025   CMP (Cancer Center only)    Standing Status:   Future    Expected Date:   09/26/2024    Expiration Date:   09/26/2025   CBC with Differential (Cancer Center Only)    Standing Status:   Future    Expected Date:   10/03/2024    Expiration Date:   10/03/2025   CMP (Cancer Center only)    Standing Status:   Future    Expected Date:   10/03/2024    Expiration Date:   10/03/2025    All questions were answered. The patient knows to call the clinic with any problems, questions or concerns.  I have spent a total of 30 minutes minutes of face-to-face and non-face-to-face time, preparing to see the patient,  performing a medically appropriate examination, counseling and educating the patient, communicating with other health care professionals, documenting clinical information in the electronic health record,  and care coordination.   Norleen IVAR Kidney, MD Department of Hematology/Oncology [facility] Phone: [phone] Pager: [phone] Email: [email or web address]   07/11/2024 4:29 PM   Literature Support:  Bringhen S, Mina R, Petrucci MT, Gaidano G, Ballanti S, Musto P, Offidani M, Spada S, Benevolo G, Ponticelli E, Galieni P, Cavo M, Di Toritto TC, Di Raimondo F, Montefusco V, Palumbo A, Boccadoro M, Larocca A. Once-weekly versus twice-weekly carfilzomib  in patients with newly diagnosed multiple myeloma: a pooled analysis of two phase I/II studies. Haematologica. 2019 Aug;104(8):1640-1647.  --Once-weekly 70 mg/m2 carfilzomib  as induction and maintenance therapy for newly diagnosed multiple myeloma patients was as safe and effective as twice-weekly 36 mg/m2 carfilzomib  and provided a more convenient schedule. "

## 2024-07-11 NOTE — Patient Instructions (Signed)
 [facility] - A DEPT OF [facility]  Discharge Instructions: Thank you for choosing [facility] to provide your oncology and hematology care.   If you have a lab appointment with the Cancer Center, please go directly to the Cancer Center and check in at the registration area.   Wear comfortable clothing and clothing appropriate for easy access to any Portacath or PICC line.   We strive to give you quality time with your provider. You may need to reschedule your appointment if you arrive late (15 or more minutes).  Arriving late affects you and other patients whose appointments are after yours.  Also, if you miss three or more appointments without notifying the office, you may be dismissed from the clinic at the provider's discretion.      For prescription refill requests, have your pharmacy contact our office and allow 72 hours for refills to be completed.    Today you received the following chemotherapy and/or immunotherapy agents kyprolis      To help prevent nausea and vomiting after your treatment, we encourage you to take your nausea medication as directed.  BELOW ARE SYMPTOMS THAT SHOULD BE REPORTED IMMEDIATELY: *FEVER GREATER THAN 100.4 F (38 C) OR HIGHER *CHILLS OR SWEATING *NAUSEA AND VOMITING THAT IS NOT CONTROLLED WITH YOUR NAUSEA MEDICATION *UNUSUAL SHORTNESS OF BREATH *UNUSUAL BRUISING OR BLEEDING *URINARY PROBLEMS (pain or burning when urinating, or frequent urination) *BOWEL PROBLEMS (unusual diarrhea, constipation, pain near the anus) TENDERNESS IN MOUTH AND THROAT WITH OR WITHOUT PRESENCE OF ULCERS (sore throat, sores in mouth, or a toothache) UNUSUAL RASH, SWELLING OR PAIN  UNUSUAL VAGINAL DISCHARGE OR ITCHING   Items with * indicate a potential emergency and should be followed up as soon as possible or go to the Emergency Department if any problems should occur.  Please show the CHEMOTHERAPY ALERT CARD or IMMUNOTHERAPY  ALERT CARD at check-in to the Emergency Department and triage nurse.  Should you have questions after your visit or need to cancel or reschedule your appointment, please contact [facility] - A DEPT OF [facility]  Dept: [phone]  and follow the prompts.  Office hours are 8:00 a.m. to 4:30 p.m. Monday - Friday. Please note that voicemails left after 4:00 p.m. may not be returned until the following business day.  We are closed weekends and major holidays. You have access to a nurse at all times for urgent questions. Please call the main number to the clinic Dept: [phone] and follow the prompts.   For any non-urgent questions, you may also contact your provider using MyChart. We now offer e-Visits for anyone 80 and older to request care online for non-urgent symptoms. For details visit [email or web address].   Also download the MyChart app! Go to the app store, search "MyChart", open the app, select [facility], and log in with your MyChart username and password.

## 2024-07-14 ENCOUNTER — Emergency Department (HOSPITAL_COMMUNITY): Payer: Medicare (Managed Care)

## 2024-07-14 ENCOUNTER — Other Ambulatory Visit: Payer: Self-pay

## 2024-07-14 ENCOUNTER — Ambulatory Visit: Payer: Medicare (Managed Care) | Admitting: Pulmonary Disease

## 2024-07-14 ENCOUNTER — Inpatient Hospital Stay (HOSPITAL_COMMUNITY)
Admission: EM | Admit: 2024-07-14 | Discharge: 2024-07-18 | Disposition: A | Discharge status: Home / Self Care | Payer: Medicare (Managed Care) | Attending: Family Medicine | Admitting: Family Medicine

## 2024-07-14 DIAGNOSIS — C9002 Multiple myeloma in relapse: Secondary | ICD-10-CM | POA: Diagnosis present

## 2024-07-14 DIAGNOSIS — E66811 Obesity, class 1: Secondary | ICD-10-CM | POA: Diagnosis present

## 2024-07-14 DIAGNOSIS — M545 Low back pain, unspecified: Secondary | ICD-10-CM | POA: Diagnosis present

## 2024-07-14 DIAGNOSIS — C9000 Multiple myeloma not having achieved remission: Secondary | ICD-10-CM

## 2024-07-14 DIAGNOSIS — D630 Anemia in neoplastic disease: Secondary | ICD-10-CM | POA: Diagnosis present

## 2024-07-14 DIAGNOSIS — Z79899 Other long term (current) drug therapy: Secondary | ICD-10-CM | POA: Diagnosis not present

## 2024-07-14 DIAGNOSIS — F411 Generalized anxiety disorder: Secondary | ICD-10-CM | POA: Diagnosis present

## 2024-07-14 DIAGNOSIS — Z8041 Family history of malignant neoplasm of ovary: Secondary | ICD-10-CM

## 2024-07-14 DIAGNOSIS — R509 Fever, unspecified: Secondary | ICD-10-CM | POA: Diagnosis not present

## 2024-07-14 DIAGNOSIS — Z7901 Long term (current) use of anticoagulants: Secondary | ICD-10-CM | POA: Diagnosis not present

## 2024-07-14 DIAGNOSIS — Z933 Colostomy status: Secondary | ICD-10-CM

## 2024-07-14 DIAGNOSIS — D696 Thrombocytopenia, unspecified: Secondary | ICD-10-CM | POA: Diagnosis present

## 2024-07-14 DIAGNOSIS — E119 Type 2 diabetes mellitus without complications: Secondary | ICD-10-CM | POA: Diagnosis present

## 2024-07-14 DIAGNOSIS — N319 Neuromuscular dysfunction of bladder, unspecified: Secondary | ICD-10-CM | POA: Diagnosis present

## 2024-07-14 DIAGNOSIS — N390 Urinary tract infection, site not specified: Principal | ICD-10-CM | POA: Diagnosis present

## 2024-07-14 DIAGNOSIS — Z833 Family history of diabetes mellitus: Secondary | ICD-10-CM | POA: Diagnosis not present

## 2024-07-14 DIAGNOSIS — Z1152 Encounter for screening for COVID-19: Secondary | ICD-10-CM

## 2024-07-14 DIAGNOSIS — Z86718 Personal history of other venous thrombosis and embolism: Secondary | ICD-10-CM

## 2024-07-14 DIAGNOSIS — J189 Pneumonia, unspecified organism: Secondary | ICD-10-CM | POA: Diagnosis present

## 2024-07-14 DIAGNOSIS — Z6835 Body mass index (BMI) 35.0-35.9, adult: Secondary | ICD-10-CM | POA: Diagnosis not present

## 2024-07-14 DIAGNOSIS — Y846 Urinary catheterization as the cause of abnormal reaction of the patient, or of later complication, without mention of misadventure at the time of the procedure: Secondary | ICD-10-CM | POA: Diagnosis present

## 2024-07-14 DIAGNOSIS — T83518A Infection and inflammatory reaction due to other urinary catheter, initial encounter: Secondary | ICD-10-CM | POA: Diagnosis present

## 2024-07-14 DIAGNOSIS — Z888 Allergy status to other drugs, medicaments and biological substances status: Secondary | ICD-10-CM

## 2024-07-14 DIAGNOSIS — E785 Hyperlipidemia, unspecified: Secondary | ICD-10-CM | POA: Diagnosis present

## 2024-07-14 LAB — URINALYSIS, W/ REFLEX TO CULTURE (INFECTION SUSPECTED)
Bilirubin Urine: NEGATIVE
Glucose, UA: NEGATIVE mg/dL
Ketones, ur: NEGATIVE mg/dL
Nitrite: POSITIVE — AB
Protein, ur: >=300 mg/dL — AB
Specific Gravity, Urine: 1.016 (ref 1.005–1.030)
WBC, UA: >50 WBC/hpf (ref 0–5)
pH: 8 (ref 5.0–8.0)

## 2024-07-14 LAB — RESP PANEL BY RT-PCR (RSV, FLU A&B, COVID)  RVPGX2
Influenza A by PCR: NEGATIVE
Influenza B by PCR: NEGATIVE
Resp Syncytial Virus by PCR: NEGATIVE
SARS Coronavirus 2 by RT PCR: NEGATIVE

## 2024-07-14 LAB — COMPREHENSIVE METABOLIC PANEL WITH GFR
ALT: 42 U/L (ref 0–44)
AST: 28 U/L (ref 15–41)
Albumin: 3.5 g/dL (ref 3.5–5.0)
Alkaline Phosphatase: 99 U/L (ref 38–126)
Anion gap: 11 (ref 5–15)
BUN: 21 mg/dL (ref 8–23)
CO2: 22 mmol/L (ref 22–32)
Calcium: 8.8 mg/dL — ABNORMAL LOW (ref 8.9–10.3)
Chloride: 110 mmol/L (ref 98–111)
Creatinine, Ser: 1.02 mg/dL (ref 0.61–1.24)
GFR, Estimated: >60 mL/min
Glucose, Bld: 130 mg/dL — ABNORMAL HIGH (ref 70–99)
Potassium: 3.3 mmol/L — ABNORMAL LOW (ref 3.5–5.1)
Sodium: 143 mmol/L (ref 135–145)
Total Bilirubin: 1.2 mg/dL (ref 0.0–1.2)
Total Protein: 5.7 g/dL — ABNORMAL LOW (ref 6.5–8.1)

## 2024-07-14 LAB — CBC WITH DIFFERENTIAL/PLATELET
Abs Immature Granulocytes: 0.13 K/uL — ABNORMAL HIGH (ref 0.00–0.07)
Basophils Absolute: 0 K/uL (ref 0.0–0.1)
Basophils Relative: 0 %
Eosinophils Absolute: 0 K/uL (ref 0.0–0.5)
Eosinophils Relative: 0 %
HCT: 31.2 % — ABNORMAL LOW (ref 39.0–52.0)
Hemoglobin: 10.5 g/dL — ABNORMAL LOW (ref 13.0–17.0)
Immature Granulocytes: 1 %
Lymphocytes Relative: 3 %
Lymphs Abs: 0.4 K/uL — ABNORMAL LOW (ref 0.7–4.0)
MCH: 27.9 pg (ref 26.0–34.0)
MCHC: 33.7 g/dL (ref 30.0–36.0)
MCV: 82.8 fL (ref 80.0–100.0)
Monocytes Absolute: 1 K/uL (ref 0.1–1.0)
Monocytes Relative: 9 %
Neutro Abs: 9.9 K/uL — ABNORMAL HIGH (ref 1.7–7.7)
Neutrophils Relative %: 87 %
Platelets: 54 K/uL — ABNORMAL LOW (ref 150–400)
RBC: 3.77 MIL/uL — ABNORMAL LOW (ref 4.22–5.81)
RDW: 19 % — ABNORMAL HIGH (ref 11.5–15.5)
WBC: 11.5 K/uL — ABNORMAL HIGH (ref 4.0–10.5)
nRBC: 1.3 % — ABNORMAL HIGH (ref 0.0–0.2)

## 2024-07-14 LAB — URIC ACID: Uric Acid, Serum: 6.4 mg/dL (ref 3.7–8.6)

## 2024-07-14 MED ORDER — DIAZEPAM 5 MG PO TABS: 5.0000 mg | ORAL_TABLET | Freq: Two times a day (BID) | ORAL | Status: DC | PRN

## 2024-07-14 MED ORDER — POTASSIUM CHLORIDE 20 MEQ PO PACK
40.0000 meq | PACK | Freq: Once | ORAL | Status: AC
  Administered 2024-07-14: 40 meq via ORAL
  Filled 2024-07-14: qty 2

## 2024-07-14 MED ORDER — SODIUM CHLORIDE 0.9 % IV BOLUS
1000.0000 mL | Freq: Once | INTRAVENOUS | Status: AC
  Administered 2024-07-14: 1000 mL via INTRAVENOUS

## 2024-07-14 MED ORDER — ONDANSETRON HCL 4 MG/2ML IJ SOLN: 4.0000 mg | Freq: Four times a day (QID) | INTRAMUSCULAR | Status: DC | PRN

## 2024-07-14 MED ORDER — RIVAROXABAN 10 MG PO TABS
10.0000 mg | ORAL_TABLET | Freq: Every day | ORAL | Status: DC
  Administered 2024-07-15: 10 mg via ORAL
  Filled 2024-07-14: qty 1

## 2024-07-14 MED ORDER — SODIUM CHLORIDE 0.9 % IV SOLN
2.0000 g | INTRAVENOUS | Status: DC
  Administered 2024-07-15 – 2024-07-18 (×4): 2 g via INTRAVENOUS
  Filled 2024-07-14 (×4): qty 20

## 2024-07-14 MED ORDER — ATORVASTATIN CALCIUM 10 MG PO TABS
20.0000 mg | ORAL_TABLET | Freq: Every day | ORAL | Status: DC
  Administered 2024-07-15 – 2024-07-18 (×4): 20 mg via ORAL
  Filled 2024-07-14 (×4): qty 2

## 2024-07-14 MED ORDER — ONDANSETRON HCL 4 MG PO TABS: 4.0000 mg | ORAL_TABLET | Freq: Four times a day (QID) | ORAL | Status: DC | PRN

## 2024-07-14 MED ORDER — ACETAMINOPHEN 650 MG RE SUPP: 650.0000 mg | Freq: Four times a day (QID) | RECTAL | Status: DC | PRN

## 2024-07-14 MED ORDER — PIPERACILLIN-TAZOBACTAM 3.375 G IVPB 30 MIN
3.3750 g | Freq: Once | INTRAVENOUS | Status: AC
  Administered 2024-07-14: 3.375 g via INTRAVENOUS
  Filled 2024-07-14: qty 50

## 2024-07-14 MED ORDER — ACETAMINOPHEN 325 MG PO TABS
650.0000 mg | ORAL_TABLET | Freq: Four times a day (QID) | ORAL | Status: DC | PRN
  Administered 2024-07-16: 650 mg via ORAL
  Filled 2024-07-14: qty 2

## 2024-07-14 NOTE — H&P (Signed)
 " History and Physical    [name] FMW:[id number] DOB: August 17, 1955 DOA: 07/14/2024  PCP: Cloria Annabella CROME, DO   Chief Complaint: fever  HPI: [name] is a 68 y.o. male with medical history significant of adrenal insufficiency, multiple myeloma, DVT on Xarelto , neurogenic bladder, paraplegia who presents emergency department with fever.  Patient was seen today in clinic for follow-up visit for treatment of also myeloma.  He underwent treatment today without complication.  He presents emergency department due to recurrent fever.  He took Tylenol  without improvement.  On arrival he was afebrile hemodynamically stable.  Labs were obtained which showed potassium 3.3, WBC 11.5, hemoglobin 10.5, urinalysis concerning for infection.  Respiratory viral panel was negative.  Patient underwent chest x-ray which showed no acute infiltrates.  Review of prior urine culture shows Proteus with susceptibility to cephalosporins.    Review of Systems: Review of Systems  Constitutional:  Positive for chills, fever and malaise/fatigue.  HENT: Negative.    Eyes: Negative.   Respiratory: Negative.    Cardiovascular: Negative.   Gastrointestinal: Negative.   Genitourinary: Negative.   Musculoskeletal: Negative.   Skin:  Positive for rash.  Neurological: Negative.   Endo/Heme/Allergies: Negative.   Psychiatric/Behavioral: Negative.       As per HPI otherwise 10 point review of systems negative.   Allergies[1]  Past Medical History:  Diagnosis Date   Adrenal insufficiency    on chronic dexamethasone    Anemia    Cancer (HCC)    Coagulopathy    on xeralto/ s/p DVT while on coumadin,  IVC in place   Diabetes mellitus without complication (HCC)    type 2   Glaucoma 12/22/2022   Gross hematuria 01/2013   post foley cath procedure   History of blood transfusion 01/2013   Lower GI bleeding 04/18/2017   Multiple myeloma    thoracic T8 with paraplegia s/p resection- on chemo at visit  10/13/10   Multiple myeloma    Multiple myeloma without mention of remission    Neurogenic bladder    Neurogenic bowel    OSA (obstructive sleep apnea) 11/01/2022   Paraplegia (HCC)    Partial small bowel obstruction (HCC) during dec 2011 admission    Past Surgical History:  Procedure Laterality Date   COLONOSCOPY WITH PROPOFOL  N/A 04/12/2017   Procedure: COLONOSCOPY WITH PROPOFOL ;  Surgeon: Abran Norleen SAILOR, MD;  [facility];  Service: Endoscopy;  Laterality: N/A;   COLONOSCOPY WITH PROPOFOL  N/A 04/19/2017   Procedure: COLONOSCOPY WITH PROPOFOL ;  Surgeon: Leigh Elspeth SQUIBB, MD;  Location[facility];  Service: Gastroenterology;  Laterality: N/A;   COLOSTOMY  07/20/2011   Procedure: COLOSTOMY;  Surgeon: Redell Alm Faith, DO;  Location: [facility] OR;  Service: General;;   COLOSTOMY REVISION  07/20/2011   Procedure: COLON RESECTION SIGMOID;  Surgeon: Redell Alm Faith, DO;  Location: [facility] OR;  Service: General;;   CYSTOSCOPY N/A 04/04/2013   Procedure: CYSTOSCOPY WITH LITHALOPAXY;  Surgeon: Ricardo Likens, MD;  [facility];  Service: Urology;  Laterality: N/A;   INCISION AND DRAINAGE ABSCESS N/A 12/25/2022   Procedure: INCISION AND DRAINAGE OF SCROTUM;  Surgeon: Lovie Arlyss CROME, MD;  [facility];  Service: Urology;  Laterality: N/A;   INSERTION OF SUPRAPUBIC CATHETER N/A 04/04/2013   Procedure: INSERTION OF SUPRAPUBIC CATHETER;  Surgeon: Ricardo Likens, MD;  [facility];  Service: Urology;  Laterality: N/A;   LAPAROTOMY  07/20/2011   Procedure: EXPLORATORY LAPAROTOMY;  Surgeon: Redell Alm Faith, DO;  Location: [facility] OR;  Service: General;  Laterality: N/A;   myeloma thoracic T8 with parpaplegia s/p thoracotomy and thoracic T7-9 cage placement on Dec 26th 2011  07/18/10   SCROTAL EXPLORATION N/A 12/29/2022   Procedure: SCROTAL WOUND DEBRIDEMENT AND CLOSURE;  Surgeon: Alvaro Ricardo KATHEE Mickey., MD;  Location[facility];  Service: Urology;  Laterality: N/A;     reports that he has  never smoked. He has never used smokeless tobacco. He reports that he does not drink alcohol and does not use drugs.  Family History  Problem Relation Age of Onset   Ovarian cancer Mother    Diabetes Father     Prior to Admission medications  Medication Sig Start Date End Date Taking? Authorizing Provider  acetaminophen  (TYLENOL ) 325 MG tablet Take 325-650 mg by mouth every 6 (six) hours as needed for headache or mild pain. 09/21/20   [provider]  acetic acid 0.25 % irrigation Irrigate with 1 Application as directed See admin instructions. Instill 50 ml's, clamp for 10 minutes, then remove clamp and drain the bladder. Repeat 3 times a week- when not using the Renacidin irrigation    [provider]  acyclovir  (ZOVIRAX ) 400 MG tablet Take 1 tablet (400 mg total) by mouth 2 (two) times daily. 08/18/20   Federico Norleen ONEIDA MADISON, MD  albuterol  (VENTOLIN  HFA) [phone] Base) MCG/ACT inhaler Inhale 2 puffs into the lungs every 4 (four) hours as needed for wheezing or shortness of breath.    [provider]  atorvastatin  (LIPITOR) 20 MG tablet Take 20 mg by mouth daily.    [provider]  BENADRYL  ALLERGY 25 MG tablet Take 25 mg by mouth every 6 (six) hours as needed (for allergic reactions).    [provider]  BIOFREEZE 4 % GEL Apply 1 application  topically every 4 (four) hours as needed (for joint pain- to intact areas of the skin only).    [provider]  Citric Ac-Gluconolact-Mg Carb (RENACIDIN IR) Irrigate with 30 mLs as directed See admin instructions. Instill 30 ml's, clamp for 10 minutes, then remove clamp and drain the bladder. Repeat 3 times a week- when not using the acetic acid irrigation    [provider]  COSOPT  PF 2-0.5 % SOLN Place 1 drop into both eyes 2 (two) times daily.    [provider]  diazepam  (VALIUM ) 5 MG tablet Take 5 mg by mouth 2 (two) times daily as needed for muscle spasms (after spinal cord injury).     [provider]  Emollient (EUCERIN EX) Apply 1 application  topically See admin instructions. Apply to affected area 2 times a day    [provider]  FLOVENT HFA 110 MCG/ACT inhaler 2 each See admin instructions. 2 sprays to colostomy stoma prior to each colostomy bag change    [provider]  guaifenesin  (ROBITUSSIN) 100 MG/5ML syrup Take 200 mg by mouth 4 (four) times daily as needed for cough.    [provider]  iron  polysaccharides (NU-IRON ) 150 MG capsule Take 1 capsule (150 mg total) by mouth daily. 04/22/17   Regalado, Belkys A, MD  latanoprost  (XALATAN ) 0.005 % ophthalmic solution Place 1 drop into both eyes at bedtime.    [provider]  Multiple Vitamin (MULTIVITAMIN WITH MINERALS) TABS tablet Take 1 tablet by mouth daily. 01/08/23   Lue Elsie BROCKS, MD  NON FORMULARY Apply 1 application  topically See admin instructions. Colo Plast paste- Apply as directed with each ostomy  bag change    [provider]  ondansetron  (ZOFRAN ) 4 MG tablet Take 1 tablet (4 mg) by mouth every 6 hours as needed for nausea. 01/08/23   Lue Elsie BROCKS, MD  rivaroxaban  (XARELTO ) 10 MG TABS tablet Take 1 tablet (10 mg total) by mouth daily with supper. Patient taking differently: Take 10 mg by mouth daily. 03/26/18   Lonn Hicks, MD  Skin Protectants, Misc. (MINERIN CREME) CREA Apply to affected areas 2 times daily as needed. 01/08/23   Lue Elsie BROCKS, MD  Zinc  Oxide [facility] EX) Apply 1 application  topically See admin instructions. Apply topically daily to affected sites    [provider]    Physical Exam: Vitals:   07/14/24 1852 07/14/24 1858 07/14/24 2000 07/14/24 2100  BP:  127/65 114/66 120/68  Pulse:  93 93 (!) 102  Resp:  (!) 26 (!) 23 (!) 21  Temp:  99.9 F (37.7 C)    SpO2: 95% 97% 100% 92%   Physical Exam Constitutional:      Appearance: He is normal weight.  HENT:     Head: Normocephalic.     Nose: Nose normal.      Mouth/Throat:     Mouth: Mucous membranes are moist.     Pharynx: Oropharynx is clear.  Eyes:     Conjunctiva/sclera: Conjunctivae normal.     Pupils: Pupils are equal, round, and reactive to light.  Cardiovascular:     Rate and Rhythm: Normal rate and regular rhythm.     Pulses: Normal pulses.     Heart sounds: Normal heart sounds.  Pulmonary:     Effort: Pulmonary effort is normal.     Breath sounds: Normal breath sounds.  Abdominal:     General: Abdomen is flat. Bowel sounds are normal.  Musculoskeletal:        General: Normal range of motion.     Cervical back: Normal range of motion.  Skin:    General: Skin is warm.     Capillary Refill: Capillary refill takes less than 2 seconds.  Neurological:     General: No focal deficit present.     Mental Status: He is alert.  Psychiatric:        Mood and Affect: Mood normal.        Labs on Admission: I have personally reviewed the patients's labs and imaging studies.  Assessment/Plan Principal Problem:   Complicated UTI (urinary tract infection)   # Fever most likely secondary to suprapubic catheter infection - Patient found to have urinalysis concerning for infection  -Patient infections with Proteus  Plan: Continue ceftriaxone  based on prior sensitivities  # Hyperlipidemia-continue Lipitor  # Generalized anxiety-continue Valium   # History of DVT-continue Xarelto   # Multiple myeloma-continue to monitor   Admission status: Inpatient Telemetry  Certification: The appropriate patient status for this patient is INPATIENT. Inpatient status is judged to be reasonable and necessary in order to provide the required intensity of service to ensure the patient's safety. The patient's presenting symptoms, physical exam findings, and initial radiographic and laboratory data in the context of their chronic comorbidities is felt to place them at high risk for further clinical deterioration. Furthermore, it is not anticipated  that the patient will be medically stable for discharge from the hospital within 2 midnights of admission.   * I certify that at the point of admission it is my clinical judgment that the patient will require inpatient hospital care spanning beyond 2 midnights from the point of  admission due to high intensity of service, high risk for further deterioration and high frequency of surveillance required.DEWAINE Lamar Dess MD [facility] If 7PM-7AM, please contact night-coverage [email or web address]  07/14/2024, 10:43 PM        [1]  Allergies Allergen Reactions   Ferumoxytol  Nausea And Vomiting and Other (See Comments)    The patient felt like he was going to pass out and had chills also    "

## 2024-07-14 NOTE — ED Provider Notes (Signed)
 " [place] [facility] Provider Note   CSN: [id number] Arrival date & time: 07/14/24  8161     Patient presents with: Fever and Back Pain   [name] is a 68 y.o. male.  With a history of multiple myeloma on chemotherapy, paraplegia neurogenic bladder and neurogenic bowel who presents to the ED for fever.  Fever of 100.4 started yesterday.  Has been taking Tylenol .  Lower back discomfort started today.  No respiratory symptoms or other overt infectious symptoms.  Last chemotherapy was 1219 and he is followed by Dr. Federico with [facility]    Fever Back Pain Associated symptoms: fever        Prior to Admission medications  Medication Sig Start Date End Date Taking? Authorizing Provider  acetaminophen  (TYLENOL ) 325 MG tablet Take 325-650 mg by mouth every 6 (six) hours as needed for headache or mild pain. 09/21/20  Yes [provider]  acetic acid 0.25 % irrigation Irrigate with 1 Application as directed See admin instructions. Instill 50 ml's, clamp for 10 minutes, then remove clamp and drain the bladder. Repeat 3 times a week- when not using the Renacidin irrigation   Yes [provider]  acyclovir  (ZOVIRAX ) 400 MG tablet Take 1 tablet (400 mg total) by mouth 2 (two) times daily. 08/18/20  Yes Federico Norleen ONEIDA MADISON, MD  albuterol  (VENTOLIN  HFA) 108 (90 Base) MCG/ACT inhaler Inhale 2 puffs into the lungs every 4 (four) hours as needed for wheezing or shortness of breath.   Yes [provider]  atorvastatin  (LIPITOR) 20 MG tablet Take 20 mg by mouth daily.   Yes [provider]  BENADRYL  ALLERGY 25 MG tablet Take 25 mg by mouth every 6 (six) hours as needed (for allergic reactions).   Yes [provider]  BIOFREEZE 4 % GEL Apply 1 application  topically every 4 (four) hours as needed (for joint pain- to intact areas of the skin only).   Yes [provider]  Citric Ac-Gluconolact-Mg Carb  (RENACIDIN IR) Irrigate with 30 mLs as directed See admin instructions. Instill 30 ml's, clamp for 10 minutes, then remove clamp and drain the bladder. Repeat 3 times a week- when not using the acetic acid irrigation   Yes [provider]  COSOPT  PF 2-0.5 % SOLN Place 1 drop into both eyes 2 (two) times daily.   Yes [provider]  diazepam  (VALIUM ) 5 MG tablet Take 5 mg by mouth 2 (two) times daily as needed for muscle spasms (after spinal cord injury).   Yes [provider]  Emollient (EUCERIN EX) Apply 1 application  topically as needed (for dryness).   Yes [provider]  FLOVENT HFA 110 MCG/ACT inhaler 2 each See admin instructions. 2 sprays to colostomy stoma prior to each colostomy bag change   Yes [provider]  iron  polysaccharides (NU-IRON ) 150 MG capsule Take 1 capsule (150 mg total) by mouth daily. 04/22/17  Yes Regalado, Belkys A, MD  latanoprost  (XALATAN ) 0.005 % ophthalmic solution Place 1 drop into both eyes at bedtime.   Yes [provider]  Multiple Vitamin (MULTIVITAMIN WITH MINERALS) TABS tablet Take 1 tablet by mouth daily. 01/08/23  Yes Lue Elsie BROCKS, MD  NON FORMULARY Apply 1 application  topically See admin instructions. Colo Plast paste- Apply as directed with each ostomy bag change   Yes [provider]  ondansetron  (ZOFRAN ) 4 MG tablet Take 1 tablet (4 mg) by mouth every  6 hours as needed for nausea. 01/08/23  Yes Lue Elsie BROCKS, MD  rivaroxaban  (XARELTO ) 10 MG TABS tablet Take 1 tablet (10 mg total) by mouth daily with supper. Patient taking differently: Take 10 mg by mouth daily. 03/26/18  Yes Lonn Hicks, MD  Skin Protectants, Misc. (MINERIN CREME) CREA Apply to affected areas 2 times daily as needed. 01/08/23  Yes Lue Elsie BROCKS, MD  Zinc  Oxide [facility] EX) Apply 1 application  topically See admin instructions. Apply topically daily to affected sites   Yes [provider]  guaifenesin   (ROBITUSSIN) 100 MG/5ML syrup Take 200 mg by mouth 4 (four) times daily as needed for cough.    [provider]    Allergies: Ferumoxytol     Review of Systems  Constitutional:  Positive for fever.  Musculoskeletal:  Positive for back pain.    Updated Vital Signs BP 120/68   Pulse (!) 102   Temp 98.1 F (36.7 C) (Oral)   Resp (!) 21   SpO2 92%   Physical Exam Vitals and nursing note reviewed.  HENT:     Head: Normocephalic and atraumatic.  Eyes:     Pupils: Pupils are equal, round, and reactive to light.  Cardiovascular:     Rate and Rhythm: Normal rate and regular rhythm.  Pulmonary:     Effort: Pulmonary effort is normal.     Breath sounds: Normal breath sounds.  Abdominal:     Palpations: Abdomen is soft.     Tenderness: There is no abdominal tenderness.     Comments: Colostomy bag in place with brown liquid stool   Genitourinary:    Comments: Suprapubic catheter in place draining clear yellow urine Skin:    General: Skin is warm and dry.     Comments: No sacral decubitus ulcer No evidence of Fournier or gangrene  Neurological:     Mental Status: He is alert.  Psychiatric:        Mood and Affect: Mood normal.     (all labs ordered are listed, but only abnormal results are displayed) Labs Reviewed  COMPREHENSIVE METABOLIC PANEL WITH GFR - Abnormal; Notable for the following components:      Result Value   Potassium 3.3 (*)    Glucose, Bld 130 (*)    Calcium  8.8 (*)    Total Protein 5.7 (*)    All other components within normal limits  CBC WITH DIFFERENTIAL/PLATELET - Abnormal; Notable for the following components:   WBC 11.5 (*)    RBC 3.77 (*)    Hemoglobin 10.5 (*)    HCT 31.2 (*)    RDW 19.0 (*)    Platelets 54 (*)    nRBC 1.3 (*)    Neutro Abs 9.9 (*)    Lymphs Abs 0.4 (*)    Abs Immature Granulocytes 0.13 (*)    All other components within normal limits  URINALYSIS, W/ REFLEX TO CULTURE (INFECTION SUSPECTED) - Abnormal; Notable for  the following components:   Color, Urine AMBER (*)    APPearance TURBID (*)    Hgb urine dipstick SMALL (*)    Protein, ur >=300 (*)    Nitrite POSITIVE (*)    Leukocytes,Ua MODERATE (*)    Bacteria, UA MANY (*)    All other components within normal limits  RESP PANEL BY RT-PCR (RSV, FLU A&B, COVID)  RVPGX2  CULTURE, BLOOD (ROUTINE X 2)  CULTURE, BLOOD (ROUTINE X 2)  URINE CULTURE  URIC ACID  HIV ANTIBODY (ROUTINE TESTING W  REFLEX)  CBC  BASIC METABOLIC PANEL WITH GFR  PROTIME-INR    EKG: None  Radiology: DG Chest Portable 1 View Result Date: 07/14/2024 EXAM: 1 VIEW(S) XRAY OF THE CHEST 07/14/2024 07:52:00 PM COMPARISON: AP and lateral chest 01/17/2024. CLINICAL HISTORY: ?pna pna FINDINGS: LUNGS AND PLEURA: Lungs are hypoinflated but generally clear. No pleural effusion. No pneumothorax. HEART AND MEDIASTINUM: There is mild cardiomegaly. There is no evidence of congestive heart failure (CHF). No acute abnormality of the mediastinal silhouette. BONES AND SOFT TISSUES: Irregularity in the posterolateral left 6th rib. Corpectomy and lateral side plate fusion hardware in the midthoracic spine with degenerative changes. IMPRESSION: 1. No acute cardiopulmonary findings. stable chest. Electronically signed by: Francis Quam MD 07/14/2024 08:09 PM EST RP Workstation: [id number]     Procedures   Medications Ordered in the ED  atorvastatin  (LIPITOR) tablet 20 mg (has no administration in time range)  diazepam  (VALIUM ) tablet 5 mg (has no administration in time range)  rivaroxaban  (XARELTO ) tablet 10 mg (has no administration in time range)  acetaminophen  (TYLENOL ) tablet 650 mg (has no administration in time range)    Or  acetaminophen  (TYLENOL ) suppository 650 mg (has no administration in time range)  ondansetron  (ZOFRAN ) tablet 4 mg (has no administration in time range)    Or  ondansetron  (ZOFRAN ) injection 4 mg (has no administration in time range)  potassium chloride  (KLOR-CON )  packet 40 mEq (has no administration in time range)  cefTRIAXone  (ROCEPHIN ) 2 g in sodium chloride  0.9 % 100 mL IVPB (has no administration in time range)  sodium chloride  0.9 % bolus 1,000 mL (0 mLs Intravenous Stopped 07/14/24 2140)  piperacillin -tazobactam (ZOSYN ) IVPB 3.375 g (0 g Intravenous Stopped 07/14/24 2302)    Clinical Course as of 07/14/24 2343  Mon Jul 14, 2024  2237 Slight leukocytosis 11.5.  No pneumonia.  UA consistent with catheter associated UTI.  Will cover with Zosyn  and admit to medicine.  Patient mains hemodynamically stable [MP]    Clinical Course User Index [MP] Pamella Ozell LABOR, DO                                 Medical Decision Making 68 year old male with history as above presenting for fever.  Currently on chemotherapy last infusion 3 days ago afebrile here slightly tachycardic.  Will obtain infectious workup.  Sources include viral respiratory illness, pneumonia, urinary tract infection.  No overt infectious symptoms.  Will consider imaging after results of initial infectious workup are back.  Admission likely  Amount and/or Complexity of Data Reviewed Labs: ordered. Radiology: ordered.  Risk Prescription drug management. Decision regarding hospitalization.        Final diagnoses:  Urinary tract infection associated with catheterization of urinary tract, unspecified indwelling urinary catheter type, initial encounter  Multiple myeloma, remission status unspecified (HCC)  Fever, unspecified fever cause    ED Discharge Orders     None          Pamella Ozell LABOR, DO 07/14/24 2343  "

## 2024-07-14 NOTE — ED Notes (Signed)
 CCMD called and verified patient on cardiac telemetry

## 2024-07-14 NOTE — ED Triage Notes (Signed)
 Per EMS  Fever Started yesterday Took tylenol  with recurrent fever Last dose 1400 Back pain Started yesterday

## 2024-07-14 NOTE — ED Notes (Signed)
 Pt given ginger ale, room temperature water, and a sandwich bag.

## 2024-07-15 ENCOUNTER — Encounter (HOSPITAL_COMMUNITY): Payer: Self-pay | Admitting: Internal Medicine

## 2024-07-15 ENCOUNTER — Other Ambulatory Visit: Payer: Self-pay

## 2024-07-15 LAB — CBC
HCT: 27.5 % — ABNORMAL LOW (ref 39.0–52.0)
Hemoglobin: 9.3 g/dL — ABNORMAL LOW (ref 13.0–17.0)
MCH: 27.4 pg (ref 26.0–34.0)
MCHC: 33.8 g/dL (ref 30.0–36.0)
MCV: 81.1 fL (ref 80.0–100.0)
Platelets: 46 K/uL — ABNORMAL LOW (ref 150–400)
RBC: 3.39 MIL/uL — ABNORMAL LOW (ref 4.22–5.81)
RDW: 19.2 % — ABNORMAL HIGH (ref 11.5–15.5)
WBC: 8.8 K/uL (ref 4.0–10.5)
nRBC: 2.4 % — ABNORMAL HIGH (ref 0.0–0.2)

## 2024-07-15 LAB — BASIC METABOLIC PANEL WITH GFR
Anion gap: 9 (ref 5–15)
BUN: 19 mg/dL (ref 8–23)
CO2: 21 mmol/L — ABNORMAL LOW (ref 22–32)
Calcium: 8.2 mg/dL — ABNORMAL LOW (ref 8.9–10.3)
Chloride: 113 mmol/L — ABNORMAL HIGH (ref 98–111)
Creatinine, Ser: 1.01 mg/dL (ref 0.61–1.24)
GFR, Estimated: >60 mL/min
Glucose, Bld: 105 mg/dL — ABNORMAL HIGH (ref 70–99)
Potassium: 3.8 mmol/L (ref 3.5–5.1)
Sodium: 143 mmol/L (ref 135–145)

## 2024-07-15 LAB — HIV ANTIBODY (ROUTINE TESTING W REFLEX): HIV Screen 4th Generation wRfx: NONREACTIVE

## 2024-07-15 LAB — URINE CULTURE

## 2024-07-15 LAB — PROTIME-INR
INR: 3 — ABNORMAL HIGH (ref 0.8–1.2)
Prothrombin Time: 32.3 s — ABNORMAL HIGH (ref 11.4–15.2)

## 2024-07-15 NOTE — Plan of Care (Signed)

## 2024-07-15 NOTE — Progress Notes (Addendum)
 "          [facility]                                                                              [name], is a 68 y.o. male, DOB - July 02, 1956, FMW:[id number] Admit date - 07/14/2024    Outpatient Primary MD for the patient is Reed, Tiffany L, DO  LOS - 1  days  Chief Complaint  Patient presents with   Fever   Back Pain       Brief summary   Patient is a 68 year old male with adrenal insufficiency, multiple myeloma, DVT on Xarelto , neurogenic bladder, paraplegia presented with fever of 100.4 F that started a day before.  He had been taking Tylenol .  Also reported lower back pain. Last chemotherapy 12/19.  In ED, afebrile, VSS WBCs 11.5, K3.3, UA concerning for UTI.  Assessment & Plan       Complicated UTI (urinary tract infection) in the setting of neurogenic bladder, suprapubic catheter - UA positive for UTI - Follow blood cultures, urine culture and sensitivities, continue IV Rocephin   Hyperlipidemia Continue Lipitor  History of multiple myeloma -Continue to monitor, no acute issues, - Last chemotherapy on 12/19.   - Last seen his oncologist, Dr. Federico on 12/19, no clinical evidence of relapse at this time.  Anemia of chronic disease secondary to multiple myeloma andiron deficiency - Continue to follow H&H, currently at baseline  History of DVT - Had placement of IVC filter, continue Xarelto   Generalized anxiety Continue Valium   Thrombocytopenia, acute on chronic - Platelets 143 on 12/19, 54K on admission, 46 today - Hold Xarelto , had chemotherapy on 12/19 - If continues to trend down, will request his oncologist Dr. Federico to evaluate.  Obesity class I Estimated body mass index is 35.82 kg/m as calculated from the following:   Height as of 06/13/24: 5' 8 (1.727 m).   Weight as of 07/11/24: 106.9 kg.  Code Status: Full code DVT Prophylaxis:  rivaroxaban  (XARELTO ) tablet 10 mg Start: 07/15/24 1000 SCDs Start: 07/14/24 2239 rivaroxaban   (XARELTO ) tablet 10 mg   Level of Care: Level of care: Telemetry Family Communication: Updated patient Disposition Plan:      Remains inpatient appropriate:      Procedures:    Consultants:   None  Antimicrobials:   Anti-infectives (From admission, onward)    Start     Dose/Rate Route Frequency Ordered Stop   07/15/24 0600  cefTRIAXone  (ROCEPHIN ) 2 g in sodium chloride  0.9 % 100 mL IVPB        2 g 200 mL/hr over 30 Minutes Intravenous Every 24 hours 07/14/24 2253     07/14/24 2230  piperacillin -tazobactam (ZOSYN ) IVPB 3.375 g        3.375 g 100 mL/hr over 30 Minutes Intravenous  Once 07/14/24 2219 07/14/24 2302          Medications  atorvastatin   20 mg Oral Daily   rivaroxaban   10 mg Oral Daily      Subjective:   [name] was seen and examined today.  No acute complaints.  Feels generalized weakness.  No fever or chills.  Patient denies dizziness, chest pain, shortness of  breath, abdominal pain, N/V.  Objective:   Vitals:   07/15/24 0315 07/15/24 0600 07/15/24 0650 07/15/24 0700  BP: 120/71 113/74  118/75  Pulse: 84 81  80  Resp: 18 (!) 21  (!) 23  Temp: 99.5 F (37.5 C)  100.2 F (37.9 C)   TempSrc: Oral  Oral   SpO2: 95% 100%  100%    Intake/Output Summary (Last 24 hours) at 07/15/2024 0931 Last data filed at 07/14/2024 2140 Gross per 24 hour  Intake 1000 ml  Output --  Net 1000 ml     Wt Readings from Last 3 Encounters:  07/11/24 106.9 kg  07/04/24 113.9 kg  06/27/24 104.8 kg     Exam General: Alert and oriented x 3, NAD Cardiovascular: S1 S2 auscultated,  RRR Respiratory: CTAB, no wheezing Gastrointestinal: Soft, nontender, nondistended, + bowel sounds Ext: no pedal edema bilaterally Neuro: No new FND's Psych: Normal affect     Data Reviewed:  I have personally reviewed following labs    CBC Lab Results  Component Value Date   WBC 8.8 07/15/2024   RBC 3.39 (L) 07/15/2024   HGB 9.3 (L) 07/15/2024   HCT 27.5 (L)  07/15/2024   MCV 81.1 07/15/2024   MCH 27.4 07/15/2024   PLT 46 (L) 07/15/2024   MCHC 33.8 07/15/2024   RDW 19.2 (H) 07/15/2024   LYMPHSABS 0.4 (L) 07/14/2024   MONOABS 1.0 07/14/2024   EOSABS 0.0 07/14/2024   BASOSABS 0.0 07/14/2024     Last metabolic panel Lab Results  Component Value Date   NA 143 07/15/2024   K 3.8 07/15/2024   CL 113 (H) 07/15/2024   CO2 21 (L) 07/15/2024   BUN 19 07/15/2024   CREATININE 1.01 07/15/2024   GLUCOSE 105 (H) 07/15/2024   GFRNONAA >60 07/15/2024   GFRAA >60 04/23/2020   CALCIUM  8.2 (L) 07/15/2024   PHOS 2.4 (L) 12/23/2022   PROT 5.7 (L) 07/14/2024   ALBUMIN  3.5 07/14/2024   LABGLOB 2.5 06/27/2024   BILITOT 1.2 07/14/2024   ALKPHOS 99 07/14/2024   AST 28 07/14/2024   ALT 42 07/14/2024   ANIONGAP 9 07/15/2024    CBG (last 3)  No results for input(s): GLUCAP in the last 72 hours.    Coagulation Profile: Recent Labs  Lab 07/15/24 0242  INR 3.0*     Radiology Studies: I have personally reviewed the imaging studies  DG Chest Portable 1 View Result Date: 07/14/2024 EXAM: 1 VIEW(S) XRAY OF THE CHEST 07/14/2024 07:52:00 PM COMPARISON: AP and lateral chest 01/17/2024. CLINICAL HISTORY: ?pna pna FINDINGS: LUNGS AND PLEURA: Lungs are hypoinflated but generally clear. No pleural effusion. No pneumothorax. HEART AND MEDIASTINUM: There is mild cardiomegaly. There is no evidence of congestive heart failure (CHF). No acute abnormality of the mediastinal silhouette. BONES AND SOFT TISSUES: Irregularity in the posterolateral left 6th rib. Corpectomy and lateral side plate fusion hardware in the midthoracic spine with degenerative changes. IMPRESSION: 1. No acute cardiopulmonary findings. stable chest. Electronically signed by: Francis Quam MD 07/14/2024 08:09 PM EST RP Workstation: [id number]       Nydia Distance M.D. [facility] 07/15/2024, 9:31 AM  Available via Epic secure chat 7am-7pm After 7 pm, please refer to night coverage  provider listed on amion.    "

## 2024-07-16 ENCOUNTER — Inpatient Hospital Stay (HOSPITAL_COMMUNITY): Payer: Medicare (Managed Care)

## 2024-07-16 DIAGNOSIS — R509 Fever, unspecified: Secondary | ICD-10-CM | POA: Diagnosis not present

## 2024-07-16 DIAGNOSIS — C9000 Multiple myeloma not having achieved remission: Secondary | ICD-10-CM | POA: Diagnosis not present

## 2024-07-16 DIAGNOSIS — N390 Urinary tract infection, site not specified: Secondary | ICD-10-CM | POA: Diagnosis not present

## 2024-07-16 MED ORDER — GADOBUTROL 1 MMOL/ML IV SOLN
10.0000 mL | Freq: Once | INTRAVENOUS | Status: AC | PRN
  Administered 2024-07-16: 10 mL via INTRAVENOUS

## 2024-07-16 MED ORDER — RIVAROXABAN 10 MG PO TABS
10.0000 mg | ORAL_TABLET | Freq: Every day | ORAL | Status: DC
  Administered 2024-07-16: 10 mg via ORAL
  Filled 2024-07-16: qty 1

## 2024-07-16 NOTE — Progress Notes (Addendum)
 [facility]  PROGRESS NOTE  [name] FMW:[id number] DOB: 05-28-1956 DOA: 07/14/2024 PCP: Cloria Annabella CROME, DO   Brief HPI:    68 y.o. male with medical history significant of adrenal insufficiency, multiple myeloma, DVT on Xarelto , neurogenic bladder, paraplegia who presents emergency department with fever.  Patient was seen today in clinic for follow-up visit for treatment of also myeloma.  He underwent treatment today without complication.  He presents emergency department due to recurrent fever.  He took Tylenol  without improvement.  On arrival he was afebrile hemodynamically stable.  Labs were obtained which showed potassium 3.3, WBC 11.5, hemoglobin 10.5, urinalysis concerning for infection.  Respiratory viral panel was negative.  Patient underwent chest x-ray which showed no acute infiltrates.     Assessment/Plan:    Fever -Unclear etiology - Empirically started on Rocephin  for possible complicated UTI; patient does have suprapubic catheter in place - Urine culture grew multiple species - Does have tenderness in mid back on examination - MRI of thoracic spine ordered; negative for any acute abnormality - Continue empiric Rocephin  for now   ?  Lung lesion --Masslike lesion noted on MRI of thoracic spine in the left lung, consider CT chest recommended -Will obtain CT chest without contrast  Neurogenic bladder -Status post suprapubic catheter placement -Catheter is replaced every 2 weeks by home health RN  Hyperlipidemia -Continue Lipitor  Generalized anxiety -Continue Valium   History of DVT - Hold Xarelto  due to thrombocytopenia  Thrombocytopenia - Likely in setting of multiple myeloma - Follow CBC in a.m.  Multiple myeloma -Followed by oncology as outpatient -As relapsed multiple myeloma, currently getting treatment with Kyprolis  and dexamethasone  -   # Hyperlipidemia-continue Lipitor   # Generalized anxiety-continue Valium    # History of  DVT-continue Xarelto    # Multiple myeloma-continue to monitor       DVT prophylaxis:   Medications     atorvastatin   20 mg Oral Daily     Data Reviewed:   CBG:  No results for input(s): GLUCAP in the last 168 hours.  SpO2: 99 % O2 Flow Rate (L/min): 2 L/min    Vitals:   07/15/24 2131 07/16/24 0040 07/16/24 0409 07/16/24 0756  BP:  131/74 131/83 135/89  Pulse:  73 76 72  Resp:  20 20   Temp:  99.6 F (37.6 C) (!) 100.4 F (38 C) 98.9 F (37.2 C)  TempSrc:  Oral Oral Oral  SpO2:  99% 97% 99%  Height: 5' 8 (1.727 m)         Data Reviewed:  Basic Metabolic Panel: Recent Labs  Lab 07/11/24 1019 07/14/24 1857 07/15/24 0242  NA 145 143 143  K 3.7 3.3* 3.8  CL 113* 110 113*  CO2 24 22 21*  GLUCOSE 104* 130* 105*  BUN 14 21 19   CREATININE 0.75 1.02 1.01  CALCIUM  9.5 8.8* 8.2*    CBC: Recent Labs  Lab 07/11/24 1019 07/14/24 1857 07/15/24 0242  WBC 8.5 11.5* 8.8  NEUTROABS 6.3 9.9*  --   HGB 10.7* 10.5* 9.3*  HCT 31.0* 31.2* 27.5*  MCV 80.5 82.8 81.1  PLT 143* 54* 46*    LFT Recent Labs  Lab 07/11/24 1019 07/14/24 1857  AST 17 28  ALT 22 42  ALKPHOS 97 99  BILITOT 0.5 1.2  PROT 5.8* 5.7*  ALBUMIN  3.7 3.5     Antibiotics: Anti-infectives (From admission, onward)    Start     Dose/Rate Route Frequency Ordered Stop   07/15/24  0600  cefTRIAXone  (ROCEPHIN ) 2 g in sodium chloride  0.9 % 100 mL IVPB        2 g 200 mL/hr over 30 Minutes Intravenous Every 24 hours 07/14/24 2253     07/14/24 2230  piperacillin -tazobactam (ZOSYN ) IVPB 3.375 g        3.375 g 100 mL/hr over 30 Minutes Intravenous  Once 07/14/24 2219 07/14/24 2302        CONSULTS   Code Status: Full code  Family Communication: Discussed with patient's wife on phone     Subjective   Complaint of back pain when he had fever.  Urine culture grew multiple species.  Patient was empirically started on IV Rocephin .  Tmax 100.4 at 4 AM this morning   Objective     Physical Examination:   Appears in no acute distress S1-S2, regular, no murmur auscultated Lungs clear to auscultation bilaterally Abdomen is soft, nontender, no organomegaly Back-tenderness noted at mid thoracic spine            Courtlynn Holloman S Shermeka Rutt   [facility] If 7PM-7AM, please contact night-coverage at [email or web address], Office  [phone]   07/16/2024, 8:21 AM  LOS: 2 days

## 2024-07-16 NOTE — TOC Initial Note (Signed)
 Transition of Care [facility]) - Initial/Assessment Note    Patient Details  Name: [name] MRN: [id number] Date of Birth: [birth year]  [facility]) CM/SW Contact:    Lauraine FORBES Saa, LCSWA Phone Number: 07/16/2024, 1:53 PM  Clinical Narrative:                  1:53 PM Per chart review, patient resides at home with spouse where he received [facility] aide services. [facility] is currently active with [facility]. Patient has a PCP and insurance through [facility]. Patient does not have SNF history. Patient has CIR history with [facility]. CSW attempted to speak with patient's [facility] social worker, Jon Novak [phone]) but there was no response and a voicemail was left. TOC will continue to follow.  Expected Discharge Plan: Home w Home Health Services Barriers to Discharge: Continued Medical Work up   Patient Goals and CMS Choice            Expected Discharge Plan and Services   Discharge Planning Services: CM Consult   Living arrangements for the past 2 months: Single Family Home                                      Prior Living Arrangements/Services Living arrangements for the past 2 months: Single Family Home Lives with:: Spouse Patient language and need for interpreter reviewed:: Yes        Need for Family Participation in Patient Care: No (Comment) Care giver support system in place?: Yes (comment) Current home services: Homehealth aide, DME Criminal Activity/Legal Involvement Pertinent to Current Situation/Hospitalization: No - Comment as needed  Activities of Daily Living   ADL Screening (condition at time of admission) Independently performs ADLs?: No Does the patient have a NEW difficulty with bathing/dressing/toileting/self-feeding that is expected to last >3 days?: No Does the patient have a NEW difficulty with getting in/out of bed, walking, or climbing stairs that is expected to last >3 days?: No Does the patient have  a NEW difficulty with communication that is expected to last >3 days?: No Is the patient deaf or have difficulty hearing?: No Does the patient have difficulty seeing, even when wearing glasses/contacts?: No Does the patient have difficulty concentrating, remembering, or making decisions?: No  Permission Sought/Granted Permission sought to share information with : Facility Medical Sales Representative, Family Supports Permission granted to share information with : No (Contact information on chart)  Share Information with NAME: [name]  Permission granted to share info w AGENCY: [facility]  Permission granted to share info w Relationship: Spouse  Permission granted to share info w Contact Information: [phone]  Emotional Assessment         Alcohol / Substance Use: Not Applicable Psych Involvement: No (comment)  Admission diagnosis:  Complicated UTI (urinary tract infection) [N39.0] Fever, unspecified fever cause [R50.9] Multiple myeloma, remission status unspecified (HCC) [C90.00] Urinary tract infection associated with catheterization of urinary tract, unspecified indwelling urinary catheter type, initial encounter [U16.488J, N39.0] Patient Active Problem List   Diagnosis Date Noted   Complicated UTI (urinary tract infection) 07/14/2024   Scrotal wall abscess 12/29/2022   Fournier's gangrene of scrotum (HCC) 12/27/2022   Glaucoma 12/22/2022   OSA (obstructive sleep apnea) 11/01/2022   Snoring 09/21/2022   Goals of care, counseling/discussion 10/09/2019   On rivaroxaban  therapy    Status post colonoscopy with polypectomy  Lower GI bleeding 04/18/2017   Acute blood loss anemia 04/18/2017   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/18/2017   Colon cancer screening    Benign neoplasm of ascending colon    History of DVT (deep vein thrombosis) 06/10/2015   Obstructed suprapubic catheter 07/21/2014   Bilateral hydronephrosis 07/21/2014   Acute kidney failure 07/20/2014    Abdominal pain 07/20/2014   AKI (acute kidney injury) 07/20/2014   Hematuria 02/12/2013   Shock (HCC) 02/12/2013   Adrenal insufficiency 02/12/2013   Hypoalbuminemia 11/15/2012   Anemia, unspecified 09/18/2012   Vitamin D  deficiency 09/18/2012   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   Iron  deficiency anemia 09/18/2012   Diverticulitis of large intestine with perforation 07/26/2011   Multiple myeloma in relapse (HCC) 10/14/2010   Neurogenic bowel 10/14/2010   Paraplegia (HCC) 07/13/2010   Neurogenic bladder 07/11/2010   BACK PAIN, LUMBAR, WITH RADICULOPATHY 07/07/2010   DISEQUILIBRIUM 07/07/2010   Abdominal pain, generalized 07/07/2010   OBESITY, NOS 09/20/2006   PCP:  Cloria Annabella CROME, DO Pharmacy:  No Pharmacies Listed    Social Drivers of Health (SDOH) Social History: SDOH Screenings   Food Insecurity: No Food Insecurity (07/15/2024)  Housing: Low Risk (07/15/2024)  Transportation Needs: No Transportation Needs (07/15/2024)  Utilities: Not At Risk (07/15/2024)  Depression (PHQ2-9): Low Risk (07/04/2024)  Social Connections: Socially Integrated (07/15/2024)  Tobacco Use: Low Risk (07/15/2024)   SDOH Interventions:     Readmission Risk Interventions    12/26/2022    3:45 PM 12/26/2022    3:36 PM 12/26/2022    3:25 PM  Readmission Risk Prevention Plan  Post Dischage Appt Complete  Complete  Medication Screening Complete  Complete  Transportation Screening Complete -- Complete

## 2024-07-16 NOTE — Plan of Care (Signed)

## 2024-07-16 NOTE — Plan of Care (Signed)

## 2024-07-17 DIAGNOSIS — R509 Fever, unspecified: Secondary | ICD-10-CM | POA: Diagnosis not present

## 2024-07-17 DIAGNOSIS — N390 Urinary tract infection, site not specified: Secondary | ICD-10-CM | POA: Diagnosis not present

## 2024-07-17 DIAGNOSIS — C9000 Multiple myeloma not having achieved remission: Secondary | ICD-10-CM | POA: Diagnosis not present

## 2024-07-17 LAB — COMPREHENSIVE METABOLIC PANEL WITH GFR
ALT: 53 U/L — ABNORMAL HIGH (ref 0–44)
AST: 38 U/L (ref 15–41)
Albumin: 3.1 g/dL — ABNORMAL LOW (ref 3.5–5.0)
Alkaline Phosphatase: 102 U/L (ref 38–126)
Anion gap: 10 (ref 5–15)
BUN: 11 mg/dL (ref 8–23)
CO2: 20 mmol/L — ABNORMAL LOW (ref 22–32)
Calcium: 8.7 mg/dL — ABNORMAL LOW (ref 8.9–10.3)
Chloride: 110 mmol/L (ref 98–111)
Creatinine, Ser: 0.69 mg/dL (ref 0.61–1.24)
GFR, Estimated: >60 mL/min
Glucose, Bld: 107 mg/dL — ABNORMAL HIGH (ref 70–99)
Potassium: 3.6 mmol/L (ref 3.5–5.1)
Sodium: 140 mmol/L (ref 135–145)
Total Bilirubin: 0.7 mg/dL (ref 0.0–1.2)
Total Protein: 5.2 g/dL — ABNORMAL LOW (ref 6.5–8.1)

## 2024-07-17 LAB — CBC
HCT: 27.2 % — ABNORMAL LOW (ref 39.0–52.0)
Hemoglobin: 9.4 g/dL — ABNORMAL LOW (ref 13.0–17.0)
MCH: 27.5 pg (ref 26.0–34.0)
MCHC: 34.6 g/dL (ref 30.0–36.0)
MCV: 79.5 fL — ABNORMAL LOW (ref 80.0–100.0)
Platelets: 105 K/uL — ABNORMAL LOW (ref 150–400)
RBC: 3.42 MIL/uL — ABNORMAL LOW (ref 4.22–5.81)
RDW: 18.9 % — ABNORMAL HIGH (ref 11.5–15.5)
WBC: 7.4 K/uL (ref 4.0–10.5)
nRBC: 0.5 % — ABNORMAL HIGH (ref 0.0–0.2)

## 2024-07-17 MED ORDER — AZITHROMYCIN 500 MG PO TABS
500.0000 mg | ORAL_TABLET | Freq: Every day | ORAL | Status: DC
  Administered 2024-07-17 – 2024-07-18 (×2): 500 mg via ORAL
  Filled 2024-07-17 (×2): qty 1

## 2024-07-17 MED ORDER — RIVAROXABAN 10 MG PO TABS
10.0000 mg | ORAL_TABLET | Freq: Every day | ORAL | Status: DC
  Administered 2024-07-17 – 2024-07-18 (×2): 10 mg via ORAL
  Filled 2024-07-17 (×2): qty 1

## 2024-07-17 NOTE — Plan of Care (Signed)

## 2024-07-17 NOTE — Plan of Care (Signed)

## 2024-07-17 NOTE — Progress Notes (Signed)
 [facility]  PROGRESS NOTE  [name] FMW:[id number] DOB: January 11, 1956 DOA: 07/14/2024 PCP: Gary Annabella CROME, DO   Brief HPI:    68 y.o. male with medical history significant of adrenal insufficiency, multiple myeloma, DVT on Xarelto , neurogenic bladder, paraplegia who presents emergency department with fever.  Patient was seen today in clinic for follow-up visit for treatment of also myeloma.  He underwent treatment today without complication.  He presents emergency department due to recurrent fever.  He took Tylenol  without improvement.  On arrival he was afebrile hemodynamically stable.  Labs were obtained which showed potassium 3.3, WBC 11.5, hemoglobin 10.5, urinalysis concerning for infection.  Respiratory viral panel was negative.  Patient underwent chest x-ray which showed no acute infiltrates.     Assessment/Plan:    Fever -Unclear etiology - Empirically started on Rocephin  for possible complicated UTI; patient does have suprapubic catheter in place - Urine culture grew multiple species - Does have tenderness in mid back on examination - MRI of thoracic spine ordered; negative for any acute abnormality - Continue empiric Rocephin  for now   ?  Lung lesion --Masslike lesion noted on MRI of thoracic spine in the left lung, consider CT chest recommended - CT chest without contrast showed infection in right upper lobe - Continue Rocephin , will add Zithromax  for 5 more days  Neurogenic bladder -Status post suprapubic catheter placement -Catheter is replaced every 2 weeks by home health RN  Hyperlipidemia -Continue Lipitor  Generalized anxiety -Continue Valium   History of DVT - Hold Xarelto  due to thrombocytopenia  Thrombocytopenia - Likely in setting of multiple myeloma - Follow CBC in a.m.  Multiple myeloma -Followed by oncology as outpatient -As relapsed multiple myeloma, currently getting treatment with Kyprolis  and dexamethasone  -     Hyperlipidemia -continue Lipitor    Generalized anxiety -continue Valium    History of DVT -continue Xarelto    Multiple myeloma -continue to monitor       DVT prophylaxis:   Medications     atorvastatin   20 mg Oral Daily   azithromycin   500 mg Oral Daily     Data Reviewed:   CBG:  No results for input(Gary): GLUCAP in the last 168 hours.  SpO2: 97 % O2 Flow Rate (L/min): 2 L/min    Vitals:   07/16/24 1947 07/16/24 2357 07/17/24 0520 07/17/24 0759  BP: (!) 154/89 (!) 143/78 139/77 132/72  Pulse: 67 68 70 64  Resp: 20 20 20 17   Temp: 98.2 F (36.8 C) 98.9 F (37.2 C) 99.4 F (37.4 C) 99.3 F (37.4 C)  TempSrc: Oral Oral Oral Oral  SpO2: 100% 97% 98% 97%  Height:          Data Reviewed:  Basic Metabolic Panel: Recent Labs  Lab 07/11/24 1019 07/14/24 1857 07/15/24 0242 07/17/24 0438  NA 145 143 143 140  K 3.7 3.3* 3.8 3.6  CL 113* 110 113* 110  CO2 24 22 21* 20*  GLUCOSE 104* 130* 105* 107*  BUN 14 21 19 11   CREATININE 0.75 1.02 1.01 0.69  CALCIUM  9.5 8.8* 8.2* 8.7*    CBC: Recent Labs  Lab 07/11/24 1019 07/14/24 1857 07/15/24 0242 07/17/24 0438  WBC 8.5 11.5* 8.8 7.4  NEUTROABS 6.3 9.9*  --   --   HGB 10.7* 10.5* 9.3* 9.4*  HCT 31.0* 31.2* 27.5* 27.2*  MCV 80.5 82.8 81.1 79.5*  PLT 143* 54* 46* 105*    LFT Recent Labs  Lab 07/11/24 1019 07/14/24 1857 07/17/24 0438  AST 17 28 38  ALT 22 42 53*  ALKPHOS 97 99 102  BILITOT 0.5 1.2 0.7  PROT 5.8* 5.7* 5.2*  ALBUMIN  3.7 3.5 3.1*     Antibiotics: Anti-infectives (From admission, onward)    Start     Dose/Rate Route Frequency Ordered Stop   07/17/24 1045  azithromycin  (ZITHROMAX ) tablet 500 mg        500 mg Oral Daily 07/17/24 0951 07/22/24 0959   07/15/24 0600  cefTRIAXone  (ROCEPHIN ) 2 g in sodium chloride  0.9 % 100 mL IVPB        2 g 200 mL/hr over 30 Minutes Intravenous Every 24 hours 07/14/24 2253     07/14/24 2230  piperacillin -tazobactam (ZOSYN ) IVPB 3.375 g         3.375 g 100 mL/hr over 30 Minutes Intravenous  Once 07/14/24 2219 07/14/24 2302        CONSULTS   Code Status: Full code  Family Communication: Discussed with patient'Gary wife on phone     Subjective   Patient seen and examined, MRI of thoracic spine was negative for vertebral pathology.  CT chest obtained last night showed no lung mass, did show infection in the right upper lobe.   Objective    Physical Examination:  Appearance no acute distress S1-S2, regular, no murmur auscultated Lungs clear to auscultation bilaterally Abdomen is soft, nontender, no organomegaly             [name] [name]   [facility] If 7PM-7AM, please contact night-coverage at [email or web address], Office  [phone]   07/17/2024, 9:51 AM  LOS: 3 days

## 2024-07-18 ENCOUNTER — Encounter: Payer: Self-pay | Admitting: Hematology and Oncology

## 2024-07-18 ENCOUNTER — Other Ambulatory Visit (HOSPITAL_COMMUNITY): Payer: Self-pay

## 2024-07-18 DIAGNOSIS — C9000 Multiple myeloma not having achieved remission: Secondary | ICD-10-CM | POA: Diagnosis not present

## 2024-07-18 DIAGNOSIS — R509 Fever, unspecified: Secondary | ICD-10-CM | POA: Diagnosis not present

## 2024-07-18 DIAGNOSIS — N390 Urinary tract infection, site not specified: Secondary | ICD-10-CM | POA: Diagnosis not present

## 2024-07-18 MED ORDER — GUAIFENESIN 100 MG/5ML PO LIQD
10.0000 mL | ORAL | Status: DC | PRN
  Administered 2024-07-18: 10 mL via ORAL
  Filled 2024-07-18: qty 15

## 2024-07-18 MED ORDER — GUAIFENESIN 100 MG/5ML PO LIQD
200.0000 mg | Freq: Four times a day (QID) | ORAL | 0 refills | Status: AC | PRN
  Filled 2024-07-18: qty 118, 3d supply, fill #0

## 2024-07-18 MED ORDER — AMOXICILLIN-POT CLAVULANATE 875-125 MG PO TABS
1.0000 | ORAL_TABLET | Freq: Two times a day (BID) | ORAL | 0 refills | Status: AC
  Filled 2024-07-18: qty 6, 3d supply, fill #0

## 2024-07-18 NOTE — Discharge Summary (Signed)
 " Physician Discharge Summary   Patient: [name] MRN: [id number] DOB: February 14, 1956  Admit date:     07/14/2024  Discharge date: 07/18/2024  Discharge Physician: [name]   PCP: Cloria Annabella CROME, DO   Recommendations at discharge:   Follow-up PCP in 2 weeks Continue Augmentin  1 tablet p.o. twice daily for 3 days  Discharge Diagnoses: Principal Problem:   Complicated UTI (urinary tract infection)  Resolved Problems:   * No resolved hospital problems. *  Hospital Course:  68 y.o. male with medical history significant of adrenal insufficiency, multiple myeloma, DVT on Xarelto , neurogenic bladder, paraplegia who presents emergency department with fever.  Patient was seen today in clinic for follow-up visit for treatment of also myeloma.  He underwent treatment today without complication.  He presents emergency department due to recurrent fever.  He took Tylenol  without improvement.  On arrival he was afebrile hemodynamically stable.  Labs were obtained which showed potassium 3.3, WBC 11.5, hemoglobin 10.5, urinalysis concerning for infection.  Respiratory viral panel was negative.  Patient underwent chest x-ray which showed no acute infiltrates.   Assessment and Plan:  Pneumonia - Presented with fever, initially thought having complicated UTI - Urine culture grew multiple species - MRI thoracic spine was negative for acute abnormality - Masslike lesion noted on MRI, CT chest recommended - CT chest without contrast showed infection in right upper lobe - Started on Rocephin  and Zithromax  -Fever has resolved -Will discharge home on Augmentin  for 3 more days and as needed Mucinex    Neurogenic bladder -Status post suprapubic catheter placement -Catheter is replaced every 2 weeks by home health RN   Hyperlipidemia -Continue Lipitor   Generalized anxiety -Continue Valium    History of DVT - Continue Xarelto    Thrombocytopenia - Likely in setting of multiple  myeloma - Resolved   Multiple myeloma -Followed by oncology as outpatient -As relapsed multiple myeloma, currently getting treatment with Kyprolis  and dexamethasone              Consultants:  Procedures performed:  Disposition: Home Diet recommendation:  Regular diet DISCHARGE MEDICATION: Allergies as of 07/18/2024       Reactions   Ferumoxytol  Nausea And Vomiting, Other (See Comments)   The patient felt like he was going to pass out and had chills also        Medication List     TAKE these medications    acetaminophen  325 MG tablet Commonly known as: TYLENOL  Take 325-650 mg by mouth every 6 (six) hours as needed for headache or mild pain.   acetic acid 0.25 % irrigation Irrigate with 1 Application as directed See admin instructions. Instill 50 ml's, clamp for 10 minutes, then remove clamp and drain the bladder. Repeat 3 times a week- when not using the Renacidin irrigation   acyclovir  400 MG tablet Commonly known as: ZOVIRAX  Take 1 tablet (400 mg total) by mouth 2 (two) times daily.   albuterol  108 (90 Base) MCG/ACT inhaler Commonly known as: VENTOLIN  HFA Inhale 2 puffs into the lungs every 4 (four) hours as needed for wheezing or shortness of breath.   amoxicillin -clavulanate 875-125 MG tablet Commonly known as: AUGMENTIN  Take 1 tablet by mouth 2 (two) times daily for 3 days.   atorvastatin  20 MG tablet Commonly known as: LIPITOR Take 20 mg by mouth daily.   BALMEX EX Apply 1 application  topically See admin instructions. Apply topically daily to affected sites   Benadryl  Allergy 25 MG tablet Generic drug: diphenhydrAMINE  Take  25 mg by mouth every 6 (six) hours as needed (for allergic reactions).   Biofreeze 4 % Gel Generic drug: Menthol (Topical Analgesic) Apply 1 application  topically every 4 (four) hours as needed (for joint pain- to intact areas of the skin only).   Cosopt  PF 2-0.5 % Soln Generic drug: Dorzolamide  HCl-Timolol  Mal PF Place 1  drop into both eyes 2 (two) times daily.   EUCERIN EX Apply 1 application  topically as needed (for dryness).   Flovent HFA 110 MCG/ACT inhaler Generic drug: fluticasone 2 each See admin instructions. 2 sprays to colostomy stoma prior to each colostomy bag change   guaifenesin  100 MG/5ML syrup Commonly known as: ROBITUSSIN Take 10 mLs (200 mg total) by mouth 4 (four) times daily as needed for cough.   iron  polysaccharides 150 MG capsule Commonly known as: Nu-Iron  Take 1 capsule (150 mg total) by mouth daily.   latanoprost  0.005 % ophthalmic solution Commonly known as: XALATAN  Place 1 drop into both eyes at bedtime.   Minerin Creme Crea Apply to affected areas 2 times daily as needed.   multivitamin with minerals Tabs tablet Take 1 tablet by mouth daily.   NON FORMULARY Apply 1 application  topically See admin instructions. Colo Plast paste- Apply as directed with each ostomy bag change   ondansetron  4 MG tablet Commonly known as: ZOFRAN  Take 1 tablet (4 mg) by mouth every 6 hours as needed for nausea.   RENACIDIN IR Irrigate with 30 mLs as directed See admin instructions. Instill 30 ml's, clamp for 10 minutes, then remove clamp and drain the bladder. Repeat 3 times a week- when not using the acetic acid irrigation   rivaroxaban  10 MG Tabs tablet Commonly known as: XARELTO  Take 1 tablet (10 mg total) by mouth daily with supper. What changed: when to take this   Valium  5 MG tablet Generic drug: diazepam  Take 5 mg by mouth 2 (two) times daily as needed for muscle spasms (after spinal cord injury).        Discharge Exam: There were no vitals filed for this visit. General-appears in no acute distress Heart-S1-S2, regular, no murmur auscultated Lungs-clear to auscultation bilaterally, no wheezing or crackles auscultated Abdomen-soft, nontender, no organomegaly Extremities-no edema in the lower extremities Neuro-alert, oriented x3, no focal deficit noted  Condition  at discharge: good  The results of significant diagnostics from this hospitalization (including imaging, microbiology, ancillary and laboratory) are listed below for reference.   Imaging Studies: CT CHEST WO CONTRAST Result Date: 07/16/2024 CLINICAL DATA:  Provided history: Chest wall mass. EXAM: CT CHEST WITHOUT CONTRAST TECHNIQUE: Multidetector CT imaging of the chest was performed following the standard protocol without IV contrast. RADIATION DOSE REDUCTION: This exam was performed according to the departmental dose-optimization program which includes automated exposure control, adjustment of the mA and/or kV according to patient size and/or use of iterative reconstruction technique. COMPARISON:  Thoracic spine MRI earlier today FINDINGS: Cardiovascular: Limited assessment in the absence of IV contrast. The heart is enlarged. Aortic atherosclerosis. No aortic aneurysm. The central pulmonary arteries are dilated. No main pulmonary artery measures 4.3 cm. The lower lobe pulmonary arteries are also dilated, which likely accounts for the tubular structure on MRI. There are coronary artery calcifications. No pericardial effusion. Mediastinum/Nodes: No mediastinal adenopathy. Assessment for hilar adenopathy is limited in the absence of IV contrast. No evidence of mediastinal mass. Unremarkable esophagus. Lungs/Pleura: Small bilateral pleural effusions. Subsegmental atelectasis within both lower lobes. The trachea and airways are patent, no evidence  of bronchial filling. Ill-defined slightly branching opacity in the periphery of the right upper lobe, series 4, images 55 and 56, mild infection versus scarring. Upper Abdomen: Gallstones within contracted gallbladder. The filter is partially included. No acute upper abdominal findings. Musculoskeletal: T7 corpectomy with lateral fusion hardware T6 through T8. Focal kyphosis at this level. No periprosthetic lucency. Contour deformity of left lateral sixth rib and  inferior scapula may represent sequela of remote injury or prior surgery. No chest wall soft tissue mass. Bilateral gynecomastia. IMPRESSION: 1. No evidence of chest wall mass. 2. Dilated central pulmonary arteries consistent with pulmonary arterial hypertension. The lower lobe pulmonary arteries are also dilated, which likely accounts for the tubular structure on MRI. 3. Small bilateral pleural effusions with subsegmental atelectasis in both lower lobes. 4. Ill-defined slightly branching opacity in the periphery of the right upper lobe, mild infection versus scarring. 5. Cardiomegaly with coronary artery calcifications. 6. Cholelithiasis. Aortic Atherosclerosis (ICD10-I70.0). Electronically Signed   By: Andrea Gasman M.D.   On: 07/16/2024 19:19   MR THORACIC SPINE W WO CONTRAST Result Date: 07/16/2024 EXAM: MR Thoracic Spine without and with IV contrast 07/16/2024 09:52:00 AM TECHNIQUE: Multiplanar multisequence MRI of the thoracic spine was performed without and with the administration of intravenous contrast. CONTRAST: 10 mL of gadobutrol  (GADAVIST ) 1 MMOL/ML injection. COMPARISON: MR Thoracic Spine without then with IV contrast 07/17/2010; MT Thoracic Spine without IV contrast 07/13/2010; Thoracic spine CT 03/13/2012. CLINICAL HISTORY: 68 year old male with remote T7 corpectomy, T6-T8 fusion, back pain, possible infection. FINDINGS: Limited sagittal imaging of the cervical spine is negative for age. BONES AND ALIGNMENT: Hardware susceptibility artifact from T6 to T8, appears concordant with the postoperative CT appearance in 2013. Focal exaggerated thoracic kyphosis at those levels has not significantly changed. Background bone marrow signal remains normal. Mild chronic T12 superior endplate compression was present in 2013 and is stable. Similar mild T8 vertebral body loss of height is chronic and stable. No marrow edema or abnormal enhancement identified. SPINAL CORD: Confluent spinal cord myelomalacia  and volume loss T6 through T8 (series 13 image 9 and series 16 image 23). The spinal cord myelomalacia abates at the T9 level. The background thoracic spinal cord volume is decreased in a generalized fashion. Conus medullaris appears to remain normal at T12-L1. No abnormal intradural enhancement or dural thickening identified. SOFT TISSUES: The left lung is abnormal, with cylindrical mass like lesion lateral to the descending thoracic aorta (series 20 image 18) but not well evaluated. This may be opacified, impacted, and expanded left lung airways which track into the left lower lobe. No pleural or pericardial effusion is present. No visible mediastinal lymphadenopathy. Negative visible upper abdominal viscera. Negative thoracic paraspinal soft tissues. DEGENERATIVE: Generally mild for age thoracic spine degeneration, and the predominant finding is thoracic degenerative posterior element hypertrophy. No thoracic spinal stenosis T1-T2 through T5-T6. Chronic postoperative changes T6 through T8. Superimposed dorsal epidural lipomatosis at those levels (series 18 image 23 at T7). Lower thoracic spinal disc bulging with endplate spurring and posterior element hypertrophy T9-T10 through T11-T12. But no significant spinal stenosis. IMPRESSION: 1. Left lung cylindrical and mass-like lesion lateral to the descending thoracic aorta. Perhaps these are impacted airways, but not well evaluated. Recommend Chest CT for better characterization. 2. No acute or inflammatory process in the thoracic spine. 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 Electronically signed by: Helayne Hurst MD 07/16/2024 10:31 AM EST RP Workstation: [id number]   DG Chest Portable 1 View Result Date:  07/14/2024 EXAM: 1 VIEW(S) XRAY OF THE CHEST 07/14/2024 07:52:00 PM COMPARISON: AP and lateral chest 01/17/2024. CLINICAL HISTORY: ?pna pna FINDINGS: LUNGS AND PLEURA: Lungs are hypoinflated but generally clear. No  pleural effusion. No pneumothorax. HEART AND MEDIASTINUM: There is mild cardiomegaly. There is no evidence of congestive heart failure (CHF). No acute abnormality of the mediastinal silhouette. BONES AND SOFT TISSUES: Irregularity in the posterolateral left 6th rib. Corpectomy and lateral side plate fusion hardware in the midthoracic spine with degenerative changes. IMPRESSION: 1. No acute cardiopulmonary findings. stable chest. Electronically signed by: Francis Quam MD 07/14/2024 08:09 PM EST RP Workstation: [id number]    Microbiology: Results for orders placed or performed during the hospital encounter of 07/14/24  Urine Culture     Status: Abnormal   Collection Time: 07/14/24  6:57 PM   Specimen: Urine, Random  Result Value Ref Range Status   Specimen Description URINE, RANDOM  Final   Special Requests   Final    NONE Reflexed from [id number] Performed at [facility], [address], [place], [place] [address]    Culture MULTIPLE SPECIES PRESENT, SUGGEST RECOLLECTION (A)  Final   Report Status 07/15/2024 FINAL  Final  Culture, blood (routine x 2)     Status: None (Preliminary result)   Collection Time: 07/14/24  7:17 PM   Specimen: BLOOD  Result Value Ref Range Status   Specimen Description BLOOD RIGHT ANTECUBITAL  Final   Special Requests   Final    BOTTLES DRAWN AEROBIC AND ANAEROBIC Blood Culture adequate volume   Culture   Final    NO GROWTH 4 DAYS Performed at [facility], [address], [place], [place] [address]    Report Status PENDING  Incomplete  Resp panel by RT-PCR (RSV, Flu A&B, Covid) Anterior Nasal Swab     Status: None   Collection Time: 07/14/24  7:19 PM   Specimen: Anterior Nasal Swab  Result Value Ref Range Status   SARS Coronavirus 2 by RT PCR NEGATIVE NEGATIVE Final   Influenza A by PCR NEGATIVE NEGATIVE Final   Influenza B by PCR NEGATIVE NEGATIVE Final    Comment: (NOTE) The Xpert Xpress SARS-CoV-2/FLU/RSV plus assay is intended as an aid in  the diagnosis of influenza from Nasopharyngeal swab specimens and should not be used as a sole basis for treatment. Nasal washings and aspirates are unacceptable for Xpert Xpress SARS-CoV-2/FLU/RSV testing.  Fact Sheet for Patients: bloggercourse.com  Fact Sheet for Healthcare Providers: seriousbroker.it  This test is not yet approved or cleared by the [place]  FDA and has been authorized for detection and/or diagnosis of SARS-CoV-2 by FDA under an Emergency Use Authorization (EUA). This EUA will remain in effect (meaning this test can be used) for the duration of the COVID-19 declaration under Section 564(b)(1) of the Act, 21 U.S.C. section 360bbb-3(b)(1), unless the authorization is terminated or revoked.     Resp Syncytial Virus by PCR NEGATIVE NEGATIVE Final    Comment: (NOTE) Fact Sheet for Patients: bloggercourse.com  Fact Sheet for Healthcare Providers: seriousbroker.it  This test is not yet approved or cleared by the [place]  FDA and has been authorized for detection and/or diagnosis of SARS-CoV-2 by FDA under an Emergency Use Authorization (EUA). This EUA will remain in effect (meaning this test can be used) for the duration of the COVID-19 declaration under Section 564(b)(1) of the Act, 21 U.S.C. section 360bbb-3(b)(1), unless the authorization is terminated or revoked.  Performed at [facility], [address] [place], [place] [address]   Culture, blood (routine x 2)     Status: None (Preliminary result)   Collection Time: 07/14/24  7:24 PM   Specimen: BLOOD  Result Value Ref Range Status   Specimen Description BLOOD LEFT ANTECUBITAL  Final   Special Requests   Final    BOTTLES DRAWN AEROBIC AND ANAEROBIC Blood Culture adequate volume   Culture   Final    NO GROWTH 4 DAYS Performed at [facility], [address], [place], [place]  [address]    Report Status PENDING  Incomplete   *Note: Due to a large number of results and/or encounters for the requested time period, some results have not been displayed. A complete set of results can be found in Results Review.    Labs: CBC: Recent Labs  Lab 07/14/24 1857 07/15/24 0242 07/17/24 0438  WBC 11.5* 8.8 7.4  NEUTROABS 9.9*  --   --   HGB 10.5* 9.3* 9.4*  HCT 31.2* 27.5* 27.2*  MCV 82.8 81.1 79.5*  PLT 54* 46* 105*   Basic Metabolic Panel: Recent Labs  Lab 07/14/24 1857 07/15/24 0242 07/17/24 0438  NA 143 143 140  K 3.3* 3.8 3.6  CL 110 113* 110  CO2 22 21* 20*  GLUCOSE 130* 105* 107*  BUN 21 19 11   CREATININE 1.02 1.01 0.69  CALCIUM  8.8* 8.2* 8.7*   Liver Function Tests: Recent Labs  Lab 07/14/24 1857 07/17/24 0438  AST 28 38  ALT 42 53*  ALKPHOS 99 102  BILITOT 1.2 0.7  PROT 5.7* 5.2*  ALBUMIN  3.5 3.1*   CBG: No results for input(s): GLUCAP in the last 168 hours.  Discharge time spent: greater than 30 minutes.  Signed: [name], MD [facility] 07/18/2024 "

## 2024-07-18 NOTE — Plan of Care (Signed)

## 2024-07-18 NOTE — Progress Notes (Signed)
 Transition of Care [facility]) - Inpatient Brief Assessment   Patient Details  Name: [name] MRN: [id number] Date of Birth: February 04, 1956  [facility] Care [facility]) CM/SW Contact:    Rosaline JONELLE Joe, RN Phone Number: 07/18/2024, 11:12 AM   Clinical Narrative: CM met with the patient at the bedside and called his wife by phone.  The patient lives at home with spouse and plans to return home today.  Patient is WC bound and WC is currently at [facility] facility.  [facility] called [facility] and requested [facility] transport to home.  Bedside nursing is aware that [facility] plans to take the patient to home.  Wife was updated.   Transition of Care Asessment: Insurance and Status: (P) Insurance coverage has been reviewed Patient has primary care physician: (P) Yes Home environment has been reviewed: (P) from home with spouse and care through [facility] Prior level of function:: (P) [facility] Prior/Current Home Services: (P) No current home services Social Drivers of Health Review: (P) SDOH reviewed interventions complete Readmission risk has been reviewed: (P) Yes Transition of care needs: (P) transition of care needs identified, TOC will continue to follow

## 2024-07-18 NOTE — Progress Notes (Incomplete)
 [facility]  PROGRESS NOTE  [name] FMW:[id number] DOB: [birth year] DOA: 07/14/2024 PCP: Gary Annabella CROME, DO   Brief HPI:    68 y.o. male with medical history significant of adrenal insufficiency, multiple myeloma, DVT on Xarelto , neurogenic bladder, paraplegia who presents emergency department with fever.  Patient was seen today in clinic for follow-up visit for treatment of also myeloma.  He underwent treatment today without complication.  He presents emergency department due to recurrent fever.  He took Tylenol  without improvement.  On arrival he was afebrile hemodynamically stable.  Labs were obtained which showed potassium 3.3, WBC 11.5, hemoglobin 10.5, urinalysis concerning for infection.  Respiratory viral panel was negative.  Patient underwent chest x-ray which showed no acute infiltrates.     Assessment/Plan:    Fever -Unclear etiology - Empirically started on Rocephin  for possible complicated UTI; patient does have suprapubic catheter in place - Urine culture grew multiple species - Does have tenderness in mid back on examination - MRI of thoracic spine ordered; negative for any acute abnormality - Continue empiric Rocephin  for now   ?  Lung lesion --Masslike lesion noted on MRI of thoracic spine in the left lung, consider CT chest recommended - CT chest without contrast showed infection in right upper lobe - Continue Rocephin , will add Zithromax  for 5 more days  Neurogenic bladder -Status post suprapubic catheter placement -Catheter is replaced every 2 weeks by home health RN  Hyperlipidemia -Continue Lipitor  Generalized anxiety -Continue Valium   History of DVT - Hold Xarelto  due to thrombocytopenia  Thrombocytopenia - Likely in setting of multiple myeloma - Follow CBC in a.m.  Multiple myeloma -Followed by oncology as outpatient -As relapsed multiple myeloma, currently getting treatment with Kyprolis  and dexamethasone  -     Hyperlipidemia -continue Lipitor    Generalized anxiety -continue Valium    History of DVT -continue Xarelto    Multiple myeloma -continue to monitor       DVT prophylaxis:   Medications     atorvastatin   20 mg Oral Daily   azithromycin   500 mg Oral Daily   rivaroxaban   10 mg Oral Daily     Data Reviewed:   CBG:  No results for input(s): GLUCAP in the last 168 hours.  SpO2: 98 % O2 Flow Rate (L/min): 2 L/min    Vitals:   07/17/24 1637 07/17/24 2036 07/18/24 0039 07/18/24 0506  BP: (!) 144/69 (!) 151/76 130/82 (!) 144/73  Pulse: (!) 59 68 76 71  Resp: 18 18 18 18   Temp: 98.9 F (37.2 C) 98.8 F (37.1 C) 98.1 F (36.7 C) 98.1 F (36.7 C)  TempSrc:  Oral Oral Oral  SpO2:  99% 97% 98%  Height:          Data Reviewed:  Basic Metabolic Panel: Recent Labs  Lab 07/11/24 1019 07/14/24 1857 07/15/24 0242 07/17/24 0438  NA 145 143 143 140  K 3.7 3.3* 3.8 3.6  CL 113* 110 113* 110  CO2 24 22 21* 20*  GLUCOSE 104* 130* 105* 107*  BUN 14 21 19 11   CREATININE 0.75 1.02 1.01 0.69  CALCIUM  9.5 8.8* 8.2* 8.7*    CBC: Recent Labs  Lab 07/11/24 1019 07/14/24 1857 07/15/24 0242 07/17/24 0438  WBC 8.5 11.5* 8.8 7.4  NEUTROABS 6.3 9.9*  --   --   HGB 10.7* 10.5* 9.3* 9.4*  HCT 31.0* 31.2* 27.5* 27.2*  MCV 80.5 82.8 81.1 79.5*  PLT 143* 54* 46* 105*    LFT Recent  Labs  Lab 07/11/24 1019 07/14/24 1857 07/17/24 0438  AST 17 28 38  ALT 22 42 53*  ALKPHOS 97 99 102  BILITOT 0.5 1.2 0.7  PROT 5.8* 5.7* 5.2*  ALBUMIN  3.7 3.5 3.1*     Antibiotics: Anti-infectives (From admission, onward)    Start     Dose/Rate Route Frequency Ordered Stop   07/17/24 1045  azithromycin  (ZITHROMAX ) tablet 500 mg        500 mg Oral Daily 07/17/24 0951 07/22/24 0959   07/15/24 0600  cefTRIAXone  (ROCEPHIN ) 2 g in sodium chloride  0.9 % 100 mL IVPB        2 g 200 mL/hr over 30 Minutes Intravenous Every 24 hours 07/14/24 2253     07/14/24 2230   piperacillin -tazobactam (ZOSYN ) IVPB 3.375 g        3.375 g 100 mL/hr over 30 Minutes Intravenous  Once 07/14/24 2219 07/14/24 2302        CONSULTS   Code Status: Full code  Family Communication: Discussed with patient's wife on phone     Subjective     Objective    Physical Examination:              [name] [name]   [facility] If 7PM-7AM, please contact night-coverage at [email or web address], Office  [phone]   07/18/2024, 8:22 AM  LOS: 4 days

## 2024-07-19 LAB — CULTURE, BLOOD (ROUTINE X 2)
Culture: NO GROWTH
Culture: NO GROWTH
Special Requests: ADEQUATE
Special Requests: ADEQUATE

## 2024-07-24 NOTE — Progress Notes (Signed)
 " Patient Care Team: Cloria Annabella CROME, DO as PCP - General (Geriatric Medicine) Babs Arthea DASEN, MD as Consulting Physician (Physical Medicine and Rehabilitation) Pegge Toribio PARAS, PA-C as Physician Assistant (Physical Medicine and Rehabilitation) Federico Norleen DASEN MADISON, MD as Consulting Physician (Hematology and Oncology) Federico Norleen DASEN MADISON, MD as Consulting Physician (Hematology and Oncology) Federico Norleen DASEN MADISON, MD as Consulting Physician (Hematology and Oncology)  Clinic Day:  07/25/2024  Referring physician: Cloria Annabella CROME, DO  ASSESSMENT & PLAN:   Assessment & Plan: Multiple myeloma in relapse (HCC)  IgG Lambda Multiple Myeloma, Relapsed (ISS Stage II) 1) 06/2010: initial diagnosis of Multiple Myeloma after T8 compression fracture. Treated with Velcade /Revlimid /Dexamethasone  and achieved a complete remission 2) Velcade  was discontinued in September 2012 and that Revlimid  and Decadron  were discontinued in March 2013. 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 4) Followed by Dr. Lonn, last clinic visit 10/09/2019. At that time there was concern for relapse of his multiple myeloma.  5) Patient requested transfer to different provider after misunderstanding regarding imaging studies 6) 12/17/2019: transfer care to Dr. Federico  7) 01/09/2020: Cycle 1 Day 1 of Dara/Velcade /Dex 8) 01/21/2020: presented as urgent visit for diarrhea and dehydration. Holding chemotherapy scheduled for 01/23/2020. 9) 01/30/2020: Resume dara/velcade /dex after resolution of diarrhea.  10) 02/13/2020: restaging labs show M protein 0.8, Kappa 4.5, lamba 17.2, ratio 0.26, urine M protein 53 (7.1%). All MM labs indicate improvement.  11) 03/10/2020: Cycle 4 Day 1 of Dara/Velcade /Dex. Transition to q 3 week daratumumab .  12) 06/04/2020:  Cycle 8 Day 1 of Dara/Velcade /Dex 13) 06/25/2020:  Cycle 9 Day 1 of Dara/Velcade /Dex 14)  07/22/2020: Cycle 10 Day 1 of  Dara//Dex 15) 08/18/2020: Cycle 11 Day 1 of Dara//Dex 16) 09/23/2020: Cycle 12 Day 1 of Dara//Dex 17) 10/22/2020: Cycle 13 Day 1 of Dara/Dex  18) 11/19/2020: Cycle 14 Day 1 of Dara/Dex  19) 12/17/2020: Cycle 15 Day 1 of Dara/Dex  20) 01/17/2021: Cycle 16 Day 1 of Dara/Dex  21) 02/11/2021: Cycle 17 Day 1 of Dara/Dex 22) 03/10/2021: Cycle 18 Day 1 of Dara/Dex  23) 04/08/2021: Cycle 19 Day 1 of Dara/Dex  24) 08/03/2021: Cycle 20 Day 1 of Dara/Dex (delayed due to scheduling error) 25) 09/02/2021: Cycle 21 Day 1 of Dara/Dex 26) 09/30/2021: Cycle 22 Day 1 of Dara/Dex 27) 10/28/2021: Cycle 23 Day 1 of Dara/Dex 28) 12/02/2021: Cycle 24 Day 1 of Dara/Dex 29) 12/30/2021: Cycle 25 Day 1 of Dara/Dex 30) 01/06/2022: Cycle 1 Day 1 of Kyprolis /Dex 31) 02/03/2022: Cycle 2 Day 1 of Kyprolis /Dex 32) 02/24/2022: Cycle 3 Day 1 of Kyprolis /Dex 33) 04/07/2022: Cycle 4 Day 1 of Kyprolis /Dex 34) 05/05/2022: Cycle 5 Day 1 of Kyprolis /Dex 35) 06/01/2022: Cycle 6 Day 1 of Kyprolis /Dex 36) 06/29/2022: Cycle 7 Day 1 of Kyprolis /Dex 37) 07/28/2022: Cycle 8 Day 1 of Kyprolis /Dex 38) 08/25/2022: Cycle 9 Day 1 of Kyprolis /Dex 39) 09/22/2022: Cycle 10 Day 1 of Kyprolis /Dex 40) 10/20/2022: Cycle 11 Day 1 of Kyprolis /Dex 41) 11/17/2022: Cycle 12 Day 1 of Kyprolis /Dex 42 ) 12/22/2022-01/08/2023: Admitted for fournier's gangrene of scrotum.  43) 01/26/2023: Cycle 13 Day 1 of Kyprolis /Dex 44) 02/16/2023: Cycle 14 Day 1 of Kyprolis /Dex 45) 03/16/2023:Cycle 15 Day 1 of Kyprolis /Dex 46) 04/13/2023: Cycle 16 Day 1 of Kyprolis /Dex 47) 05/11/2023: Cycle 17 Day 1 of Kyprolis /Dex 48) 06/07/2023: Cycle 18 Day 1 of Kyprolis /Dex 49) 07/06/2023: Cycle 19 Day 1 of Kyprolis /Dex 50) 08/02/2023: Cycle 20 Day  1 of Kyprolis /Dex 51) 08/31/2023: Cycle 21 Day 1 of Kyprolis /Dex 52) 09/28/2023: Cycle 22 Day 1 of Kyprolis /Dex 53) 10/26/2023: Cycle 23 Day 1 of Kyprolis /Dex 54) 11/23/2023: Cycle 24 Day 1 of Kyprolis /Dex 55) 12/21/2023: Cycle 25 Day 1 of  Kyprolis /Dex 56) 01/18/2024: Cycle 26 Day 1 of Kyprolis /Dex 57) 02/08/2024: Cycle 27 Day 1 of Kyprolis /Dex 58) 03/07/2024: Cycle 28 Day 1 of Kyprolis /Dex 59) 04/04/2024: Cycle 29 Day 1 of Kyprolis /Dex 60) 05/02/2024: Cycle 30 Day 1 Kyprolis /Dex 61) 05/30/2024: Cycle 31 Day 1 Kyprolis /Dex 62) 06/27/2024: Cycle 32 Day 1 Kyprolis /Dex 63) 07/25/2024      cycle 33 day 1 Kyprolis /Dex   Leukocytosis Likely from recent infection with pneumonia and UTI.  WBC 14.1.  Currently taking Augmentin  with persistent congestion in the lungs along with cough.  Using breathing treatments.  Will change antibiotics from Augmentin  to doxycycline  twice daily.  He understands to call for symptoms that worsen or do not improve over next 72 hours.  Multiple myeloma Patient presents for cycle 3 day 1 Kyprolis /Dex.  Tolerating well..  Overall, stable myeloma panel.  M protein is 0.2.  Kappa/lambda light chains are within normal limits with normal kappa lambda light chain ratio at 1.01.  Plan Labs reviewed. - Leukocytosis, likely due to recent infection.  Change antibiotics to doxycycline  twice daily.  Reassess labs in 2 weeks. - Mild and stable anemia with Hgb 9.7 and HCT 28.5. - CMP is unremarkable. - Stable myeloma panel.  Light chain panel unremarkable. Proceed with cycle 33 day 1 Kyprolis /Dex. Continue with treatments scheduled. Labs/flush and follow-ups as scheduled.  The patient understands the plans discussed today and is in agreement with them.  He knows to contact our office if he develops concerns prior to his next appointment.  I provided 25 minutes of face-to-face time during this encounter and > 50% was spent counseling as documented under my assessment and plan.    Powell FORBES Lessen, NP  [facility] - A DEPT OF [facility] [address] [place] [address] Dept: [phone] Dept Fax: [phone]   No orders of the defined types  were placed in this encounter.     CHIEF COMPLAINT:  CC: Multiple myeloma  Current Treatment: Kyprolis /Dex  INTERVAL HISTORY:  [name] is here today for repeat clinical assessment.  He last saw Dr. Federico on 07/11/2024.  Patient recently hospitalized for pneumonia.  He also had UTI.  Was discharged on Augmentin .  Still feeling congested.  Has cough.  Denies shortness of breath.  States he is improved since hospitalization.  He denies chest pain, chest pressure, or shortness of breath. He denies headaches or visual disturbances. He denies abdominal pain, nausea, vomiting, or changes in bowel or bladder habits.  He does have suprapubic catheter and colostomy.  Both intact.  He denies fevers or chills. He denies pain. His appetite is good. His weight has been stable.  I have reviewed the past medical history, past surgical history, social history and family history with the patient and they are unchanged from previous note.  ALLERGIES:  is allergic to ferumoxytol .  MEDICATIONS:  Current Outpatient Medications  Medication Sig Dispense Refill   acetaminophen  (TYLENOL ) 325 MG tablet Take 325-650 mg by mouth every 6 (six) hours as needed for headache or mild pain.     acetic acid 0.25 % irrigation Irrigate with 1 Application as directed See admin instructions. Instill 50 ml's, clamp for 10 minutes, then  remove clamp and drain the bladder. Repeat 3 times a week- when not using the Renacidin irrigation     acyclovir  (ZOVIRAX ) 400 MG tablet Take 1 tablet (400 mg total) by mouth 2 (two) times daily. 60 tablet 2   albuterol  (VENTOLIN  HFA) 108 (90 Base) MCG/ACT inhaler Inhale 2 puffs into the lungs every 4 (four) hours as needed for wheezing or shortness of breath.     atorvastatin  (LIPITOR) 20 MG tablet Take 20 mg by mouth daily.     BENADRYL  ALLERGY 25 MG tablet Take 25 mg by mouth every 6 (six) hours as needed (for allergic reactions).     BIOFREEZE 4 % GEL Apply 1 application  topically every 4  (four) hours as needed (for joint pain- to intact areas of the skin only).     Citric Ac-Gluconolact-Mg Carb (RENACIDIN IR) Irrigate with 30 mLs as directed See admin instructions. Instill 30 ml's, clamp for 10 minutes, then remove clamp and drain the bladder. Repeat 3 times a week- when not using the acetic acid irrigation     COSOPT  PF 2-0.5 % SOLN Place 1 drop into both eyes 2 (two) times daily.     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     doxycycline  (VIBRA -TABS) 100 MG tablet Take 1 tablet (100 mg total) by mouth 2 (two) times daily. 14 tablet 0   Emollient (EUCERIN EX) Apply 1 application  topically as needed (for dryness).     FLOVENT HFA 110 MCG/ACT inhaler 2 each See admin instructions. 2 sprays to colostomy stoma prior to each colostomy bag change     guaiFENesin  (ROBITUSSIN) 100 MG/5ML liquid Take 10 mLs (200 mg total) by mouth 4 (four) times daily as needed for cough. 118 mL 0   iron  polysaccharides (NU-IRON ) 150 MG capsule Take 1 capsule (150 mg total) by mouth daily. 30 capsule 0   latanoprost  (XALATAN ) 0.005 % ophthalmic solution Place 1 drop into both eyes at bedtime.     Multiple Vitamin (MULTIVITAMIN WITH MINERALS) TABS tablet Take 1 tablet by mouth daily. 30 tablet 0   NON FORMULARY Apply 1 application  topically See admin instructions. Colo Plast paste- Apply as directed with each ostomy bag change     ondansetron  (ZOFRAN ) 4 MG tablet Take 1 tablet (4 mg) by mouth every 6 hours as needed for nausea. 20 tablet 0   rivaroxaban  (XARELTO ) 10 MG TABS tablet Take 1 tablet (10 mg total) by mouth daily with supper. (Patient taking differently: Take 10 mg by mouth daily.) 30 tablet 9   Skin Protectants, Misc. (MINERIN CREME) CREA Apply to affected areas 2 times daily as needed. 454 g 0   Zinc  Oxide (BALMEX EX) Apply 1 application  topically See admin instructions. Apply topically daily to affected sites      No current facility-administered medications for this visit.   Facility-Administered Medications Ordered in Other Visits  Medication Dose Route Frequency Provider Last Rate Last Admin   sodium chloride  flush (NS) 0.9 % injection 10 mL  10 mL Intravenous PRN Lonn Hicks, MD        HISTORY OF PRESENT ILLNESS:   Oncology History  Multiple myeloma in relapse (HCC)  10/14/2010 Initial Diagnosis   Multiple myeloma in relapse (HCC)   01/09/2020 - 12/30/2021 Chemotherapy   Patient is on Treatment Plan : MYELOMA RELAPSED / REFRACTORY Daratumumab  + Bortezomib  + Dexamethasone  (DaraVd) q21d /  Daratumumab  q28d     01/06/2022 - 03/24/2022 Chemotherapy   Patient is on Treatment Plan : MYELOMA RELAPSED/REFRACTORY Carfilzomib  + Dexamethasone  (Kd) weekly q28d     01/06/2022 -  Chemotherapy   Patient is on Treatment Plan : MYELOMA RELAPSED/REFRACTORY Carfilzomib  (20/70) D1,8,15 + Dexamethasone  weekly (40) (Kd) q28d  x 9 cycles / Dexamethasone  D1,8,15         REVIEW OF SYSTEMS:   Constitutional: Denies fevers, chills or abnormal weight loss Eyes: Denies blurriness of vision Ears, nose, mouth, throat, and face: Denies mucositis or sore throat Respiratory: Has chest and nasal congestion.  Congested.  Recently treated for pneumonia.  Currently on Augmentin  twice daily. Cardiovascular: Denies palpitation, chest discomfort or lower extremity swelling Gastrointestinal:  Denies nausea, heartburn or change in bowel habits Skin: Denies abnormal skin rashes Lymphatics: Denies new lymphadenopathy or easy bruising Neurological:Denies numbness, tingling or new weaknesses Behavioral/Psych: Mood is stable, no new changes  All other systems were reviewed with the patient and are negative.   VITALS:   Today's Vitals   07/25/24 1000 07/25/24 1026  BP:  134/78  Pulse:  75  Resp:  17  Temp:  (!) 97.3 F (36.3 C)  TempSrc:  Temporal  SpO2:  100%  Weight:  243 lb (110.2 kg)  Height:  5' 8 (1.727 m)   PainSc: 0-No pain    Body mass index is 36.95 kg/m.   Wt Readings from Last 3 Encounters:  08/08/24 242 lb (109.8 kg)  07/25/24 243 lb (110.2 kg)  07/11/24 235 lb 9.6 oz (106.9 kg)    Body mass index is 36.95 kg/m.  Performance status (ECOG): 2 - Symptomatic, <50% confined to bed  PHYSICAL EXAM:   GENERAL:alert, no distress and comfortable SKIN: skin color, texture, turgor are normal, no rashes or significant lesions EYES: normal, Conjunctiva are pink and non-injected, sclera clear OROPHARYNX:no exudate, no erythema and lips, buccal mucosa, and tongue normal  NECK: supple, thyroid normal size, non-tender, without nodularity LYMPH:  no palpable lymphadenopathy in the cervical, axillary or inguinal LUNGS: Rhonchi auscultated throughout the lung fields.  Congested cough, nonproductive, noted throughout visit. HEART: regular rate & rhythm and no murmurs and no lower extremity edema ABDOMEN:abdomen soft, non-tender and normal bowel sounds Musculoskeletal:no cyanosis of digits and no clubbing  NEURO: alert & oriented x 3 with fluent speech, no focal motor/sensory deficits  LABORATORY DATA:  I have reviewed the data as listed    Component Value Date/Time   NA 144 08/08/2024 1116   NA 141 03/01/2017 1133   K 3.6 08/08/2024 1116   K 4.0 03/01/2017 1133   CL 111 08/08/2024 1116   CL 104 11/15/2012 0823   CO2 23 08/08/2024 1116   CO2 26 03/01/2017 1133   GLUCOSE 101 (H) 08/08/2024 1116   GLUCOSE 89 03/01/2017 1133   GLUCOSE 141 (H) 11/15/2012 0823   BUN 10 08/08/2024 1116   BUN 13.7 03/01/2017 1133   CREATININE 0.76 08/08/2024 1116   CREATININE 0.8 03/01/2017 1133   CALCIUM  9.6 08/08/2024 1116   CALCIUM  10.1 03/01/2017 1133   PROT 6.1 (L) 08/08/2024 1116   PROT 7.7 03/01/2017 1133   PROT 8.1 03/01/2017 1133   ALBUMIN  3.6 08/08/2024 1116   ALBUMIN  3.7 03/01/2017 1133   AST 15 08/08/2024 1116   AST 28 03/01/2017 1133   ALT 16 08/08/2024 1116   ALT 35 03/01/2017 1133    ALKPHOS 97 08/08/2024 1116   ALKPHOS 91 03/01/2017 1133   BILITOT 0.7 08/08/2024  1116   BILITOT 0.60 03/01/2017 1133   GFRNONAA >60 08/08/2024 1116   GFRAA >60 04/23/2020 1006    Lab Results  Component Value Date   WBC 7.9 08/08/2024   NEUTROABS 6.1 08/08/2024   HGB 10.1 (L) 08/08/2024   HCT 29.8 (L) 08/08/2024   MCV 81.9 08/08/2024   PLT 134 (L) 08/08/2024    RADIOGRAPHIC STUDIES: CT CHEST WO CONTRAST Result Date: 07/16/2024 CLINICAL DATA:  Provided history: Chest wall mass. EXAM: CT CHEST WITHOUT CONTRAST TECHNIQUE: Multidetector CT imaging of the chest was performed following the standard protocol without IV contrast. RADIATION DOSE REDUCTION: This exam was performed according to the departmental dose-optimization program which includes automated exposure control, adjustment of the mA and/or kV according to patient size and/or use of iterative reconstruction technique. COMPARISON:  Thoracic spine MRI earlier today FINDINGS: Cardiovascular: Limited assessment in the absence of IV contrast. The heart is enlarged. Aortic atherosclerosis. No aortic aneurysm. The central pulmonary arteries are dilated. No main pulmonary artery measures 4.3 cm. The lower lobe pulmonary arteries are also dilated, which likely accounts for the tubular structure on MRI. There are coronary artery calcifications. No pericardial effusion. Mediastinum/Nodes: No mediastinal adenopathy. Assessment for hilar adenopathy is limited in the absence of IV contrast. No evidence of mediastinal mass. Unremarkable esophagus. Lungs/Pleura: Small bilateral pleural effusions. Subsegmental atelectasis within both lower lobes. The trachea and airways are patent, no evidence of bronchial filling. Ill-defined slightly branching opacity in the periphery of the right upper lobe, series 4, images 55 and 56, mild infection versus scarring. Upper Abdomen: Gallstones within contracted gallbladder. The filter is partially included. No acute upper  abdominal findings. Musculoskeletal: T7 corpectomy with lateral fusion hardware T6 through T8. Focal kyphosis at this level. No periprosthetic lucency. Contour deformity of left lateral sixth rib and inferior scapula may represent sequela of remote injury or prior surgery. No chest wall soft tissue mass. Bilateral gynecomastia. IMPRESSION: 1. No evidence of chest wall mass. 2. Dilated central pulmonary arteries consistent with pulmonary arterial hypertension. The lower lobe pulmonary arteries are also dilated, which likely accounts for the tubular structure on MRI. 3. Small bilateral pleural effusions with subsegmental atelectasis in both lower lobes. 4. Ill-defined slightly branching opacity in the periphery of the right upper lobe, mild infection versus scarring. 5. Cardiomegaly with coronary artery calcifications. 6. Cholelithiasis. Aortic Atherosclerosis (ICD10-I70.0). Electronically Signed   By: Andrea Gasman M.D.   On: 07/16/2024 19:19   MR THORACIC SPINE W WO CONTRAST Result Date: 07/16/2024 EXAM: MR Thoracic Spine without and with IV contrast 07/16/2024 09:52:00 AM TECHNIQUE: Multiplanar multisequence MRI of the thoracic spine was performed without and with the administration of intravenous contrast. CONTRAST: 10 mL of gadobutrol  (GADAVIST ) 1 MMOL/ML injection. COMPARISON: MR Thoracic Spine without then with IV contrast 07/17/2010; MT Thoracic Spine without IV contrast 07/13/2010; Thoracic spine CT 03/13/2012. CLINICAL HISTORY: 69 year old male with remote T7 corpectomy, T6-T8 fusion, back pain, possible infection. FINDINGS: Limited sagittal imaging of the cervical spine is negative for age. BONES AND ALIGNMENT: Hardware susceptibility artifact from T6 to T8, appears concordant with the postoperative CT appearance in 2013. Focal exaggerated thoracic kyphosis at those levels has not significantly changed. Background bone marrow signal remains normal. Mild chronic T12 superior endplate compression was  present in 2013 and is stable. Similar mild T8 vertebral body loss of height is chronic and stable. No marrow edema or abnormal enhancement identified. 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 The spinal cord myelomalacia abates at the T9 level. The background thoracic spinal cord volume is decreased in a generalized fashion. Conus medullaris appears to remain normal at T12-L1. No abnormal intradural enhancement or dural thickening identified. SOFT TISSUES: The left lung is abnormal, with cylindrical mass like lesion lateral to the descending thoracic aorta (series 20 image 18) but not well evaluated. This may be opacified, impacted, and expanded left lung airways which track into the left lower lobe. No pleural or pericardial effusion is present. No visible mediastinal lymphadenopathy. Negative visible upper abdominal viscera. Negative thoracic paraspinal soft tissues. DEGENERATIVE: Generally mild for age thoracic spine degeneration, and the predominant finding is thoracic degenerative posterior element hypertrophy. No thoracic spinal stenosis T1-T2 through T5-T6. Chronic postoperative changes T6 through T8. Superimposed dorsal epidural lipomatosis at those levels (series 18 image 23 at T7). Lower thoracic spinal disc bulging with endplate spurring and posterior element hypertrophy T9-T10 through T11-T12. But no significant spinal stenosis. IMPRESSION: 1. Left lung cylindrical and mass-like lesion lateral to the descending thoracic aorta. Perhaps these are impacted airways, but not well evaluated. Recommend Chest CT for better characterization. 2. No acute or inflammatory process in the thoracic spine. 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 Electronically signed by: Helayne Hurst MD 07/16/2024 10:31 AM EST RP Workstation: [id number]   DG Chest Portable 1 View Result Date: 07/14/2024 EXAM:  1 VIEW(S) XRAY OF THE CHEST 07/14/2024 07:52:00 PM COMPARISON: AP and lateral chest 01/17/2024. CLINICAL HISTORY: ?pna pna FINDINGS: LUNGS AND PLEURA: Lungs are hypoinflated but generally clear. No pleural effusion. No pneumothorax. HEART AND MEDIASTINUM: There is mild cardiomegaly. There is no evidence of congestive heart failure (CHF). No acute abnormality of the mediastinal silhouette. BONES AND SOFT TISSUES: Irregularity in the posterolateral left 6th rib. Corpectomy and lateral side plate fusion hardware in the midthoracic spine with degenerative changes. IMPRESSION: 1. No acute cardiopulmonary findings. stable chest. Electronically signed by: Francis Quam MD 07/14/2024 08:09 PM EST RP Workstation: [id number]   "

## 2024-07-24 NOTE — Assessment & Plan Note (Addendum)
 " IgG Lambda Multiple Myeloma, Relapsed (ISS Stage II) 1) 06/2010: initial diagnosis of Multiple Myeloma after T8 compression fracture. Treated with Velcade /Revlimid /Dexamethasone  and achieved a complete remission 2) Velcade  was discontinued in September 2012 and that Revlimid  and Decadron  were discontinued in March 2013. 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 4) Followed by Dr. Lonn, last clinic visit 10/09/2019. At that time there was concern for relapse of his multiple myeloma.  5) Patient requested transfer to different provider after misunderstanding regarding imaging studies 6) 12/17/2019: transfer care to Dr. Federico  7) 01/09/2020: Cycle 1 Day 1 of Dara/Velcade /Dex 8) 01/21/2020: presented as urgent visit for diarrhea and dehydration. Holding chemotherapy scheduled for 01/23/2020. 9) 01/30/2020: Resume dara/velcade /dex after resolution of diarrhea.  10) 02/13/2020: restaging labs show M protein 0.8, Kappa 4.5, lamba 17.2, ratio 0.26, urine M protein 53 (7.1%). All MM labs indicate improvement.  11) 03/10/2020: Cycle 4 Day 1 of Dara/Velcade /Dex. Transition to q 3 week daratumumab .  12) 06/04/2020:  Cycle 8 Day 1 of Dara/Velcade /Dex 13) 06/25/2020:  Cycle 9 Day 1 of Dara/Velcade /Dex 14) 07/22/2020: Cycle 10 Day 1 of  Dara//Dex 15) 08/18/2020: Cycle 11 Day 1 of Dara//Dex 16) 09/23/2020: Cycle 12 Day 1 of Dara//Dex 17) 10/22/2020: Cycle 13 Day 1 of Dara/Dex  18) 11/19/2020: Cycle 14 Day 1 of Dara/Dex  19) 12/17/2020: Cycle 15 Day 1 of Dara/Dex  20) 01/17/2021: Cycle 16 Day 1 of Dara/Dex  21) 02/11/2021: Cycle 17 Day 1 of Dara/Dex 22) 03/10/2021: Cycle 18 Day 1 of Dara/Dex  23) 04/08/2021: Cycle 19 Day 1 of Dara/Dex  24) 08/03/2021: Cycle 20 Day 1 of Dara/Dex (delayed due to scheduling error) 25) 09/02/2021: Cycle 21 Day 1 of Dara/Dex 26) 09/30/2021: Cycle 22 Day 1 of Dara/Dex 27) 10/28/2021: Cycle 23 Day 1 of  Dara/Dex 28) 12/02/2021: Cycle 24 Day 1 of Dara/Dex 29) 12/30/2021: Cycle 25 Day 1 of Dara/Dex 30) 01/06/2022: Cycle 1 Day 1 of Kyprolis /Dex 31) 02/03/2022: Cycle 2 Day 1 of Kyprolis /Dex 32) 02/24/2022: Cycle 3 Day 1 of Kyprolis /Dex 33) 04/07/2022: Cycle 4 Day 1 of Kyprolis /Dex 34) 05/05/2022: Cycle 5 Day 1 of Kyprolis /Dex 35) 06/01/2022: Cycle 6 Day 1 of Kyprolis /Dex 36) 06/29/2022: Cycle 7 Day 1 of Kyprolis /Dex 37) 07/28/2022: Cycle 8 Day 1 of Kyprolis /Dex 38) 08/25/2022: Cycle 9 Day 1 of Kyprolis /Dex 39) 09/22/2022: Cycle 10 Day 1 of Kyprolis /Dex 40) 10/20/2022: Cycle 11 Day 1 of Kyprolis /Dex 41) 11/17/2022: Cycle 12 Day 1 of Kyprolis /Dex 42 ) 12/22/2022-01/08/2023: Admitted for fournier's gangrene of scrotum.  43) 01/26/2023: Cycle 13 Day 1 of Kyprolis /Dex 44) 02/16/2023: Cycle 14 Day 1 of Kyprolis /Dex 45) 03/16/2023:Cycle 15 Day 1 of Kyprolis /Dex 46) 04/13/2023: Cycle 16 Day 1 of Kyprolis /Dex 47) 05/11/2023: Cycle 17 Day 1 of Kyprolis /Dex 48) 06/07/2023: Cycle 18 Day 1 of Kyprolis /Dex 49) 07/06/2023: Cycle 19 Day 1 of Kyprolis /Dex 50) 08/02/2023: Cycle 20 Day 1 of Kyprolis /Dex 51) 08/31/2023: Cycle 21 Day 1 of Kyprolis /Dex 52) 09/28/2023: Cycle 22 Day 1 of Kyprolis /Dex 53) 10/26/2023: Cycle 23 Day 1 of Kyprolis /Dex 54) 11/23/2023: Cycle 24 Day 1 of Kyprolis /Dex 55) 12/21/2023: Cycle 25 Day 1 of Kyprolis /Dex 56) 01/18/2024: Cycle 26 Day 1 of Kyprolis /Dex 57) 02/08/2024: Cycle 27 Day 1 of Kyprolis /Dex 58) 03/07/2024: Cycle 28 Day 1 of Kyprolis /Dex 59) 04/04/2024: Cycle 29 Day 1 of Kyprolis /Dex 60) 05/02/2024: Cycle 30 Day 1 Kyprolis /Dex 61) 05/30/2024: Cycle 31 Day 1 Kyprolis /Dex 62) 06/27/2024: Cycle 32 Day 1  Kyprolis /Dex 63) 07/25/2024      cycle 33 day 1 Kyprolis /Dex "

## 2024-07-25 ENCOUNTER — Inpatient Hospital Stay: Payer: Self-pay

## 2024-07-25 ENCOUNTER — Inpatient Hospital Stay: Payer: Medicare (Managed Care) | Attending: Physician Assistant

## 2024-07-25 ENCOUNTER — Inpatient Hospital Stay: Payer: Self-pay | Admitting: Physician Assistant

## 2024-07-25 ENCOUNTER — Inpatient Hospital Stay: Payer: Medicare (Managed Care)

## 2024-07-25 ENCOUNTER — Encounter: Payer: Self-pay | Admitting: Hematology and Oncology

## 2024-07-25 ENCOUNTER — Inpatient Hospital Stay (HOSPITAL_BASED_OUTPATIENT_CLINIC_OR_DEPARTMENT_OTHER): Payer: Medicare (Managed Care) | Admitting: Nurse Practitioner

## 2024-07-25 VITALS — BP 134/78 | HR 75 | Temp 97.3°F | Resp 17 | Ht 68.0 in | Wt 243.0 lb

## 2024-07-25 DIAGNOSIS — C9002 Multiple myeloma in relapse: Secondary | ICD-10-CM | POA: Insufficient documentation

## 2024-07-25 DIAGNOSIS — D649 Anemia, unspecified: Secondary | ICD-10-CM | POA: Diagnosis not present

## 2024-07-25 DIAGNOSIS — Z5111 Encounter for antineoplastic chemotherapy: Secondary | ICD-10-CM | POA: Insufficient documentation

## 2024-07-25 LAB — CBC WITH DIFFERENTIAL (CANCER CENTER ONLY)
Abs Immature Granulocytes: 0.34 K/uL — ABNORMAL HIGH (ref 0.00–0.07)
Basophils Absolute: 0 K/uL (ref 0.0–0.1)
Basophils Relative: 0 %
Eosinophils Absolute: 0 K/uL (ref 0.0–0.5)
Eosinophils Relative: 0 %
HCT: 28.5 % — ABNORMAL LOW (ref 39.0–52.0)
Hemoglobin: 9.7 g/dL — ABNORMAL LOW (ref 13.0–17.0)
Immature Granulocytes: 2 %
Lymphocytes Relative: 11 %
Lymphs Abs: 1.6 K/uL (ref 0.7–4.0)
MCH: 27.2 pg (ref 26.0–34.0)
MCHC: 34 g/dL (ref 30.0–36.0)
MCV: 80.1 fL (ref 80.0–100.0)
Monocytes Absolute: 0.6 K/uL (ref 0.1–1.0)
Monocytes Relative: 4 %
Neutro Abs: 11.5 K/uL — ABNORMAL HIGH (ref 1.7–7.7)
Neutrophils Relative %: 83 %
Platelet Count: 478 K/uL — ABNORMAL HIGH (ref 150–400)
RBC: 3.56 MIL/uL — ABNORMAL LOW (ref 4.22–5.81)
RDW: 19.7 % — ABNORMAL HIGH (ref 11.5–15.5)
WBC Count: 14.1 K/uL — ABNORMAL HIGH (ref 4.0–10.5)
nRBC: 0.3 % — ABNORMAL HIGH (ref 0.0–0.2)

## 2024-07-25 LAB — CMP (CANCER CENTER ONLY)
ALT: 33 U/L (ref 0–44)
AST: 22 U/L (ref 15–41)
Albumin: 3.7 g/dL (ref 3.5–5.0)
Alkaline Phosphatase: 105 U/L (ref 38–126)
Anion gap: 13 (ref 5–15)
BUN: 10 mg/dL (ref 8–23)
CO2: 21 mmol/L — ABNORMAL LOW (ref 22–32)
Calcium: 9.5 mg/dL (ref 8.9–10.3)
Chloride: 110 mmol/L (ref 98–111)
Creatinine: 0.75 mg/dL (ref 0.61–1.24)
GFR, Estimated: >60 mL/min
Glucose, Bld: 127 mg/dL — ABNORMAL HIGH (ref 70–99)
Potassium: 3.5 mmol/L (ref 3.5–5.1)
Sodium: 144 mmol/L (ref 135–145)
Total Bilirubin: 0.7 mg/dL (ref 0.0–1.2)
Total Protein: 6.8 g/dL (ref 6.5–8.1)

## 2024-07-25 MED ORDER — DEXTROSE 5 % IV SOLN
70.0000 mg/m2 | Freq: Once | INTRAVENOUS | Status: AC
  Administered 2024-07-25: 150 mg via INTRAVENOUS
  Filled 2024-07-25: qty 60

## 2024-07-25 MED ORDER — SODIUM CHLORIDE 0.9 % IV SOLN
40.0000 mg | Freq: Once | INTRAVENOUS | Status: AC
  Administered 2024-07-25: 40 mg via INTRAVENOUS
  Filled 2024-07-25: qty 4

## 2024-07-25 MED ORDER — SODIUM CHLORIDE 0.9 % IV SOLN: Freq: Once | INTRAVENOUS | Status: AC

## 2024-07-25 MED ORDER — DOXYCYCLINE HYCLATE 100 MG PO TABS: 100.0000 mg | ORAL_TABLET | Freq: Two times a day (BID) | ORAL | 0 refills | Status: DC

## 2024-07-25 NOTE — Patient Instructions (Signed)
 [facility] - A DEPT OF [facility]  Discharge Instructions: Thank you for choosing [facility] to provide your oncology and hematology care.   If you have a lab appointment with the Cancer Center, please go directly to the Cancer Center and check in at the registration area.   Wear comfortable clothing and clothing appropriate for easy access to any Portacath or PICC line.   We strive to give you quality time with your provider. You may need to reschedule your appointment if you arrive late (15 or more minutes).  Arriving late affects you and other patients whose appointments are after yours.  Also, if you miss three or more appointments without notifying the office, you may be dismissed from the clinic at the provider's discretion.      For prescription refill requests, have your pharmacy contact our office and allow 72 hours for refills to be completed.    Today you received the following chemotherapy and/or immunotherapy agents Kyprolis      To help prevent nausea and vomiting after your treatment, we encourage you to take your nausea medication as directed.  BELOW ARE SYMPTOMS THAT SHOULD BE REPORTED IMMEDIATELY: *FEVER GREATER THAN 100.4 F (38 C) OR HIGHER *CHILLS OR SWEATING *NAUSEA AND VOMITING THAT IS NOT CONTROLLED WITH YOUR NAUSEA MEDICATION *UNUSUAL SHORTNESS OF BREATH *UNUSUAL BRUISING OR BLEEDING *URINARY PROBLEMS (pain or burning when urinating, or frequent urination) *BOWEL PROBLEMS (unusual diarrhea, constipation, pain near the anus) TENDERNESS IN MOUTH AND THROAT WITH OR WITHOUT PRESENCE OF ULCERS (sore throat, sores in mouth, or a toothache) UNUSUAL RASH, SWELLING OR PAIN  UNUSUAL VAGINAL DISCHARGE OR ITCHING   Items with * indicate a potential emergency and should be followed up as soon as possible or go to the Emergency Department if any problems should occur.  Please show the CHEMOTHERAPY ALERT CARD or IMMUNOTHERAPY  ALERT CARD at check-in to the Emergency Department and triage nurse.  Should you have questions after your visit or need to cancel or reschedule your appointment, please contact [facility] - A DEPT OF [facility]  Dept: [phone]  and follow the prompts.  Office hours are 8:00 a.m. to 4:30 p.m. Monday - Friday. Please note that voicemails left after 4:00 p.m. may not be returned until the following business day.  We are closed weekends and major holidays. You have access to a nurse at all times for urgent questions. Please call the main number to the clinic Dept: [phone] and follow the prompts.   For any non-urgent questions, you may also contact your provider using MyChart. We now offer e-Visits for anyone 49 and older to request care online for non-urgent symptoms. For details visit [email or web address].   Also download the MyChart app! Go to the app store, search "MyChart", open the app, select [facility] [place], and log in with your MyChart username and password.

## 2024-07-28 LAB — KAPPA/LAMBDA LIGHT CHAINS
Kappa free light chain: 15.7 mg/L (ref 3.3–19.4)
Kappa, lambda light chain ratio: 1.01 (ref 0.26–1.65)
Lambda free light chains: 15.5 mg/L (ref 5.7–26.3)

## 2024-07-29 LAB — MULTIPLE MYELOMA PANEL, SERUM
Albumin SerPl Elph-Mcnc: 3 g/dL (ref 2.9–4.4)
Albumin/Glob SerPl: 1 (ref 0.7–1.7)
Alpha 1: 0.4 g/dL (ref 0.0–0.4)
Alpha2 Glob SerPl Elph-Mcnc: 1 g/dL (ref 0.4–1.0)
B-Globulin SerPl Elph-Mcnc: 1.1 g/dL (ref 0.7–1.3)
Gamma Glob SerPl Elph-Mcnc: 0.7 g/dL (ref 0.4–1.8)
Globulin, Total: 3.1 g/dL (ref 2.2–3.9)
IgA: 108 mg/dL (ref 61–437)
IgG (Immunoglobin G), Serum: 745 mg/dL (ref 603–1613)
IgM (Immunoglobulin M), Srm: 39 mg/dL (ref 20–172)
M Protein SerPl Elph-Mcnc: 0.2 g/dL — ABNORMAL HIGH
Total Protein ELP: 6.1 g/dL (ref 6.0–8.5)

## 2024-07-31 ENCOUNTER — Other Ambulatory Visit: Payer: Self-pay

## 2024-08-01 ENCOUNTER — Other Ambulatory Visit: Payer: Self-pay

## 2024-08-01 ENCOUNTER — Inpatient Hospital Stay: Payer: Medicare (Managed Care)

## 2024-08-01 ENCOUNTER — Encounter: Payer: Self-pay | Admitting: Physician Assistant

## 2024-08-01 VITALS — BP 132/75 | HR 72 | Temp 98.7°F | Resp 20

## 2024-08-01 DIAGNOSIS — Z5111 Encounter for antineoplastic chemotherapy: Secondary | ICD-10-CM | POA: Diagnosis not present

## 2024-08-01 DIAGNOSIS — C9002 Multiple myeloma in relapse: Secondary | ICD-10-CM

## 2024-08-01 LAB — CBC WITH DIFFERENTIAL (CANCER CENTER ONLY)
Abs Immature Granulocytes: 0.06 K/uL (ref 0.00–0.07)
Basophils Absolute: 0 K/uL (ref 0.0–0.1)
Basophils Relative: 0 %
Eosinophils Absolute: 0.3 K/uL (ref 0.0–0.5)
Eosinophils Relative: 5 %
HCT: 33 % — ABNORMAL LOW (ref 39.0–52.0)
Hemoglobin: 10.9 g/dL — ABNORMAL LOW (ref 13.0–17.0)
Immature Granulocytes: 1 %
Lymphocytes Relative: 14 %
Lymphs Abs: 1 K/uL (ref 0.7–4.0)
MCH: 27.3 pg (ref 26.0–34.0)
MCHC: 33 g/dL (ref 30.0–36.0)
MCV: 82.5 fL (ref 80.0–100.0)
Monocytes Absolute: 0.7 K/uL (ref 0.1–1.0)
Monocytes Relative: 10 %
Neutro Abs: 4.8 K/uL (ref 1.7–7.7)
Neutrophils Relative %: 70 %
Platelet Count: 118 K/uL — ABNORMAL LOW (ref 150–400)
RBC: 4 MIL/uL — ABNORMAL LOW (ref 4.22–5.81)
RDW: 20.4 % — ABNORMAL HIGH (ref 11.5–15.5)
WBC Count: 6.9 K/uL (ref 4.0–10.5)
nRBC: 0.4 % — ABNORMAL HIGH (ref 0.0–0.2)

## 2024-08-01 LAB — CMP (CANCER CENTER ONLY)
ALT: 17 U/L (ref 0–44)
AST: 18 U/L (ref 15–41)
Albumin: 3.4 g/dL — ABNORMAL LOW (ref 3.5–5.0)
Alkaline Phosphatase: 96 U/L (ref 38–126)
Anion gap: 10 (ref 5–15)
BUN: 10 mg/dL (ref 8–23)
CO2: 24 mmol/L (ref 22–32)
Calcium: 9.5 mg/dL (ref 8.9–10.3)
Chloride: 111 mmol/L (ref 98–111)
Creatinine: 0.77 mg/dL (ref 0.61–1.24)
GFR, Estimated: >60 mL/min
Glucose, Bld: 92 mg/dL (ref 70–99)
Potassium: 3.5 mmol/L (ref 3.5–5.1)
Sodium: 145 mmol/L (ref 135–145)
Total Bilirubin: 0.9 mg/dL (ref 0.0–1.2)
Total Protein: 6 g/dL — ABNORMAL LOW (ref 6.5–8.1)

## 2024-08-01 MED ORDER — SODIUM CHLORIDE 0.9 % IV SOLN: Freq: Once | INTRAVENOUS | Status: AC

## 2024-08-01 MED ORDER — DEXTROSE 5 % IV SOLN
70.0000 mg/m2 | Freq: Once | INTRAVENOUS | Status: AC
  Administered 2024-08-01: 150 mg via INTRAVENOUS
  Filled 2024-08-01: qty 60

## 2024-08-01 MED ORDER — SODIUM CHLORIDE 0.9 % IV SOLN
40.0000 mg | Freq: Once | INTRAVENOUS | Status: AC
  Administered 2024-08-01: 40 mg via INTRAVENOUS
  Filled 2024-08-01: qty 40

## 2024-08-01 NOTE — Progress Notes (Signed)
 Pt. has a rash left lower posterior torso. Rash is red, raised, dry patches, and pt. 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 Pt. states he has been seen by other MD and using Eucerin cream on rash. [place] [employer], [place] notified and states ok to proceed with treatment today. Sherrilyn Sobers RN at chairside for rash assessment.

## 2024-08-01 NOTE — Patient Instructions (Signed)
 [facility] - A DEPT OF [facility]  Discharge Instructions: Thank you for choosing [facility] to provide your oncology and hematology care.   If you have a lab appointment with the Cancer Center, please go directly to the Cancer Center and check in at the registration area.   Wear comfortable clothing and clothing appropriate for easy access to any Portacath or PICC line.   We strive to give you quality time with your provider. You may need to reschedule your appointment if you arrive late (15 or more minutes).  Arriving late affects you and other patients whose appointments are after yours.  Also, if you miss three or more appointments without notifying the office, you may be dismissed from the clinic at the provider's discretion.      For prescription refill requests, have your pharmacy contact our office and allow 72 hours for refills to be completed.    Today you received the following chemotherapy and/or immunotherapy agent: Carfilzomib (Kyprolis)      To help prevent nausea and vomiting after your treatment, we encourage you to take your nausea medication as directed.  BELOW ARE SYMPTOMS THAT SHOULD BE REPORTED IMMEDIATELY: *FEVER GREATER THAN 100.4 F (38 C) OR HIGHER *CHILLS OR SWEATING *NAUSEA AND VOMITING THAT IS NOT CONTROLLED WITH YOUR NAUSEA MEDICATION *UNUSUAL SHORTNESS OF BREATH *UNUSUAL BRUISING OR BLEEDING *URINARY PROBLEMS (pain or burning when urinating, or frequent urination) *BOWEL PROBLEMS (unusual diarrhea, constipation, pain near the anus) TENDERNESS IN MOUTH AND THROAT WITH OR WITHOUT PRESENCE OF ULCERS (sore throat, sores in mouth, or a toothache) UNUSUAL RASH, SWELLING OR PAIN  UNUSUAL VAGINAL DISCHARGE OR ITCHING   Items with * indicate a potential emergency and should be followed up as soon as possible or go to the Emergency Department if any problems should occur.  Please show the CHEMOTHERAPY ALERT CARD or  IMMUNOTHERAPY ALERT CARD at check-in to the Emergency Department and triage nurse.  Should you have questions after your visit or need to cancel or reschedule your appointment, please contact [facility] - [facility]  Dept: [phone]  and follow the prompts.  Office hours are 8:00 a.m. to 4:30 p.m. Monday - Friday. Please note that voicemails left after 4:00 p.m. may not be returned until the following business day.  We are closed weekends and major holidays. You have access to a nurse at all times for urgent questions. Please call the main number to the clinic Dept: [phone] and follow the prompts.   For any non-urgent questions, you may also contact your provider using MyChart. We now offer e-Visits for anyone 81 and older to request care online for non-urgent symptoms. For details visit [email or web address].   Also download the MyChart app! Go to the app store, search "MyChart", open the app, select [place], and log in with your MyChart username and password.

## 2024-08-01 NOTE — Progress Notes (Signed)
 Patient reports a rash on the left flank that is red, dry and flaky. Pt has been using Eucerin EX with little improvement.  Recommended a trial of Aquaphor for skin hydration and barrier protection.  Per [place] [employer], [place] rash is unlikely related to kyprolis  and cleared to proceed with treatment.

## 2024-08-07 MED FILL — Dexamethasone Sodium Phosphate Inj 100 MG/10ML: INTRAMUSCULAR | Qty: 4 | Status: AC

## 2024-08-08 ENCOUNTER — Inpatient Hospital Stay: Payer: Medicare (Managed Care)

## 2024-08-08 ENCOUNTER — Inpatient Hospital Stay (HOSPITAL_BASED_OUTPATIENT_CLINIC_OR_DEPARTMENT_OTHER): Payer: Medicare (Managed Care) | Admitting: Hematology and Oncology

## 2024-08-08 VITALS — BP 137/79 | HR 61 | Temp 97.5°F | Resp 13 | Wt 242.0 lb

## 2024-08-08 DIAGNOSIS — Z95828 Presence of other vascular implants and grafts: Secondary | ICD-10-CM

## 2024-08-08 DIAGNOSIS — C9002 Multiple myeloma in relapse: Secondary | ICD-10-CM

## 2024-08-08 DIAGNOSIS — Z5111 Encounter for antineoplastic chemotherapy: Secondary | ICD-10-CM

## 2024-08-08 DIAGNOSIS — G8220 Paraplegia, unspecified: Secondary | ICD-10-CM

## 2024-08-08 DIAGNOSIS — Z7901 Long term (current) use of anticoagulants: Secondary | ICD-10-CM

## 2024-08-08 LAB — CBC WITH DIFFERENTIAL (CANCER CENTER ONLY)
Abs Immature Granulocytes: 0.04 K/uL (ref 0.00–0.07)
Basophils Absolute: 0 K/uL (ref 0.0–0.1)
Basophils Relative: 0 %
Eosinophils Absolute: 0.2 K/uL (ref 0.0–0.5)
Eosinophils Relative: 2 %
HCT: 29.8 % — ABNORMAL LOW (ref 39.0–52.0)
Hemoglobin: 10.1 g/dL — ABNORMAL LOW (ref 13.0–17.0)
Immature Granulocytes: 1 %
Lymphocytes Relative: 10 %
Lymphs Abs: 0.8 K/uL (ref 0.7–4.0)
MCH: 27.7 pg (ref 26.0–34.0)
MCHC: 33.9 g/dL (ref 30.0–36.0)
MCV: 81.9 fL (ref 80.0–100.0)
Monocytes Absolute: 0.8 K/uL (ref 0.1–1.0)
Monocytes Relative: 10 %
Neutro Abs: 6.1 K/uL (ref 1.7–7.7)
Neutrophils Relative %: 77 %
Platelet Count: 134 K/uL — ABNORMAL LOW (ref 150–400)
RBC: 3.64 MIL/uL — ABNORMAL LOW (ref 4.22–5.81)
RDW: 20.3 % — ABNORMAL HIGH (ref 11.5–15.5)
WBC Count: 7.9 K/uL (ref 4.0–10.5)
nRBC: 0.3 % — ABNORMAL HIGH (ref 0.0–0.2)

## 2024-08-08 LAB — CMP (CANCER CENTER ONLY)
ALT: 16 U/L (ref 0–44)
AST: 15 U/L (ref 15–41)
Albumin: 3.6 g/dL (ref 3.5–5.0)
Alkaline Phosphatase: 97 U/L (ref 38–126)
Anion gap: 10 (ref 5–15)
BUN: 10 mg/dL (ref 8–23)
CO2: 23 mmol/L (ref 22–32)
Calcium: 9.6 mg/dL (ref 8.9–10.3)
Chloride: 111 mmol/L (ref 98–111)
Creatinine: 0.76 mg/dL (ref 0.61–1.24)
GFR, Estimated: >60 mL/min
Glucose, Bld: 101 mg/dL — ABNORMAL HIGH (ref 70–99)
Potassium: 3.6 mmol/L (ref 3.5–5.1)
Sodium: 144 mmol/L (ref 135–145)
Total Bilirubin: 0.7 mg/dL (ref 0.0–1.2)
Total Protein: 6.1 g/dL — ABNORMAL LOW (ref 6.5–8.1)

## 2024-08-08 MED ORDER — SODIUM CHLORIDE 0.9 % IV SOLN: Freq: Once | INTRAVENOUS | Status: AC

## 2024-08-08 MED ORDER — SODIUM CHLORIDE 0.9 % IV SOLN
40.0000 mg | Freq: Once | INTRAVENOUS | Status: AC
  Administered 2024-08-08: 40 mg via INTRAVENOUS
  Filled 2024-08-08: qty 40

## 2024-08-08 MED ORDER — DEXTROSE 5 % IV SOLN
70.0000 mg/m2 | Freq: Once | INTRAVENOUS | Status: AC
  Administered 2024-08-08: 150 mg via INTRAVENOUS
  Filled 2024-08-08: qty 60

## 2024-08-08 NOTE — Progress Notes (Signed)
 " [facility] [phone]   [phone]  PROGRESS NOTE  Patient Care Team: Cloria Annabella CROME, DO as PCP - General (Geriatric Medicine) Babs Arthea DASEN, MD as Consulting Physician (Physical Medicine and Rehabilitation) Pegge Toribio PARAS, PA-C as Physician Assistant (Physical Medicine and Rehabilitation) Federico Norleen DASEN MADISON, MD as Consulting Physician (Hematology and Oncology) Federico Norleen DASEN MADISON, MD as Consulting Physician (Hematology and Oncology) Federico Norleen DASEN MADISON, MD as Consulting Physician (Hematology and Oncology)  Hematological/Oncological History # IgG Lambda Multiple Myeloma, Relapsed (ISS Stage II) 1) 06/2010: initial diagnosis of Multiple Myeloma after T8 compression fracture. Treated with Velcade /Revlimid /Dexamethasone  and achieved a complete remission 2) Velcade  was discontinued in September 2012 and that Revlimid  and Decadron  were discontinued in March 2013. 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 4) Followed by Dr. Lonn, last clinic visit 10/09/2019. At that time there was concern for relapse of his multiple myeloma.  5) Patient requested transfer to different provider after misunderstanding regarding imaging studies 6) 12/17/2019: transfer care to Dr. Federico  7) 01/09/2020: Cycle 1 Day 1 of Dara/Velcade /Dex 8) 01/21/2020: presented as urgent visit for diarrhea and dehydration. Holding chemotherapy scheduled for 01/23/2020. 9) 01/30/2020: Resume dara/velcade /dex after resolution of diarrhea.  10) 02/13/2020: restaging labs show M protein 0.8, Kappa 4.5, lamba 17.2, ratio 0.26, urine M protein 53 (7.1%). All MM labs indicate improvement.  11) 03/10/2020: Cycle 4 Day 1 of Dara/Velcade /Dex. Transition to q 3 week daratumumab .  12) 06/04/2020:  Cycle 8 Day 1 of Dara/Velcade /Dex 13) 06/25/2020:  Cycle 9 Day 1 of Dara/Velcade /Dex 14) 07/22/2020: Cycle 10 Day  1 of  Dara//Dex 15) 08/18/2020: Cycle 11 Day 1 of Dara//Dex 16) 09/23/2020: Cycle 12 Day 1 of Dara//Dex 17) 10/22/2020: Cycle 13 Day 1 of Dara/Dex  18) 11/19/2020: Cycle 14 Day 1 of Dara/Dex  19) 12/17/2020: Cycle 15 Day 1 of Dara/Dex  20) 01/17/2021: Cycle 16 Day 1 of Dara/Dex  21) 02/11/2021: Cycle 17 Day 1 of Dara/Dex 22) 03/10/2021: Cycle 18 Day 1 of Dara/Dex  23) 04/08/2021: Cycle 19 Day 1 of Dara/Dex  24) 08/03/2021: Cycle 20 Day 1 of Dara/Dex (delayed due to scheduling error) 25) 09/02/2021: Cycle 21 Day 1 of Dara/Dex 26) 09/30/2021: Cycle 22 Day 1 of Dara/Dex 27) 10/28/2021: Cycle 23 Day 1 of Dara/Dex 28) 12/02/2021: Cycle 24 Day 1 of Dara/Dex 29) 12/30/2021: Cycle 25 Day 1 of Dara/Dex 30) 01/06/2022: Cycle 1 Day 1 of Kyprolis /Dex 31) 02/03/2022: Cycle 2 Day 1 of Kyprolis /Dex 32) 02/24/2022: Cycle 3 Day 1 of Kyprolis /Dex 33) 04/07/2022: Cycle 4 Day 1 of Kyprolis /Dex 34) 05/05/2022: Cycle 5 Day 1 of Kyprolis /Dex 35) 06/01/2022: Cycle 6 Day 1 of Kyprolis /Dex 36) 06/29/2022: Cycle 7 Day 1 of Kyprolis /Dex 37) 07/28/2022: Cycle 8 Day 1 of Kyprolis /Dex 38) 08/25/2022: Cycle 9 Day 1 of Kyprolis /Dex 39) 09/22/2022: Cycle 10 Day 1 of Kyprolis /Dex 40) 10/20/2022: Cycle 11 Day 1 of Kyprolis /Dex 41) 11/17/2022: Cycle 12 Day 1 of Kyprolis /Dex 42 ) 12/22/2022-01/08/2023: Admitted for fournier's gangrene of scrotum.  43) 01/26/2023: Cycle 13 Day 1 of Kyprolis /Dex 44) 02/16/2023: Cycle 14 Day 1 of Kyprolis /Dex 45) 03/16/2023:Cycle 15 Day 1 of Kyprolis /Dex 46) 04/13/2023: Cycle 16 Day 1 of Kyprolis /Dex 47) 05/11/2023: Cycle 17 Day 1 of Kyprolis /Dex 48) 06/07/2023: Cycle 18 Day 1 of Kyprolis /Dex 49) 07/06/2023: Cycle 19 Day 1 of Kyprolis /Dex 50) 08/02/2023: Cycle 20 Day 1 of Kyprolis /Dex 51) 08/31/2023: Cycle 21 Day 1  of Kyprolis /Dex 52) 09/28/2023: Cycle 22 Day 1 of Kyprolis /Dex 53) 10/26/2023: Cycle 23 Day 1 of Kyprolis /Dex 54) 11/23/2023: Cycle 24 Day 1 of Kyprolis /Dex 55) 12/21/2023: Cycle 25 Day 1 of Kyprolis /Dex 56)  01/18/2024: Cycle 26 Day 1 of Kyprolis /Dex 57) 02/08/2024: Cycle 27 Day 1 of Kyprolis /Dex 58) 03/07/2024: Cycle 28 Day 1 of Kyprolis /Dex 59) 04/04/2024: Cycle 29 Day 1 of Kyprolis /Dex 60) 05/02/2024: Cycle 30 Day 1 Kyprolis /Dex 61) 05/30/2024: Cycle 31 Day 1 Kyprolis /Dex 62) 06/27/2024: Cycle 32 Day 1 Kyprolis /Dex   Interval History:  [name] 69 y.o. male with medical history significant for  IgG Lambda Multiple Myeloma who presents for a follow up visit. The patient's last visit was on 07/25/2024.  He presents today prior to treatment with Kyprolis /Dex. He is unaccompanied for this visit.   On exam today [name] reports he unfortunately has had a bout of pneumonia and chest congestion in the interim since her last visit.  He was admitted to the hospital for this.  He reports he is still in the rebound.  He is not having any shakes but does have some persistent cough with occasional phlegm production.  He reports he is not currently having any pain anywhere.  He reports he is tolerating his chemotherapy well with no major side effects.  He has not been having any trouble with nausea, vomiting, or diarrhea.  He reports no numbness or tingling of his fingers.  He reports that his diarrhea did pick up and become dark while he had this pneumonia but it has been improving.  Overall he feels okay and is willing and able to proceed with treatment today.   MEDICAL HISTORY:  Past Medical History:  Diagnosis Date   Adrenal insufficiency    on chronic dexamethasone    Anemia    Cancer (HCC)    Coagulopathy    on xeralto/ s/p DVT while on coumadin,  IVC in place   Diabetes mellitus without complication (HCC)    type 2   Glaucoma 12/22/2022   Gross hematuria 01/2013   post foley cath procedure   History of blood transfusion 01/2013   Lower GI bleeding 04/18/2017   Multiple myeloma    thoracic T8 with paraplegia s/p resection- on chemo at visit 10/13/10   Multiple myeloma    Multiple  myeloma without mention of remission    Neurogenic bladder    Neurogenic bowel    OSA (obstructive sleep apnea) 11/01/2022   Paraplegia (HCC)    Partial small bowel obstruction (HCC) during dec 2011 admission    SURGICAL HISTORY: Past Surgical History:  Procedure Laterality Date   COLONOSCOPY WITH PROPOFOL  N/A 04/12/2017   Procedure: COLONOSCOPY WITH PROPOFOL ;  Surgeon: Abran Norleen SAILOR, MD;  Location: [facility];  Service: Endoscopy;  Laterality: N/A;   COLONOSCOPY WITH PROPOFOL  N/A 04/19/2017   Procedure: COLONOSCOPY WITH PROPOFOL ;  Surgeon: Leigh Elspeth SQUIBB, MD;  Location: [facility];  Service: Gastroenterology;  Laterality: N/A;   COLOSTOMY  07/20/2011   Procedure: COLOSTOMY;  Surgeon: Redell Alm Faith, DO;  Location: [facility] OR;  Service: General;;   COLOSTOMY REVISION  07/20/2011   Procedure: COLON RESECTION SIGMOID;  Surgeon: Redell Alm Faith, DO;  Location: [facility];  Service: General;;   CYSTOSCOPY N/A 04/04/2013   Procedure: CYSTOSCOPY WITH LITHALOPAXY;  Surgeon: Ricardo Likens, MD;  Location: [facility];  Service: Urology;  Laterality: N/A;   INCISION AND DRAINAGE ABSCESS N/A 12/25/2022   Procedure: INCISION AND DRAINAGE OF SCROTUM;  Surgeon: Lovie,  Arlyss CROME, MD;  Location: [facility];  Service: Urology;  Laterality: N/A;   INSERTION OF SUPRAPUBIC CATHETER N/A 04/04/2013   Procedure: INSERTION OF SUPRAPUBIC CATHETER;  Surgeon: Ricardo Likens, MD;  Location: [facility];  Service: Urology;  Laterality: N/A;   LAPAROTOMY  07/20/2011   Procedure: EXPLORATORY LAPAROTOMY;  Surgeon: Redell Alm Faith, DO;  Location: [facility] OR;  Service: General;  Laterality: N/A;   myeloma thoracic T8 with parpaplegia s/p thoracotomy and thoracic T7-9 cage placement on Dec 26th 2011  07/18/10   SCROTAL EXPLORATION N/A 12/29/2022   Procedure: SCROTAL WOUND DEBRIDEMENT AND CLOSURE;  Surgeon: Likens Ricardo KATHEE Mickey., MD;  Location: [facility];  Service: Urology;  Laterality: N/A;    SOCIAL HISTORY: Social History    Socioeconomic History   Marital status: Married    Spouse name: Not on file   Number of children: Not on file   Years of education: Not on file   Highest education level: Not on file  Occupational History   Not on file  Tobacco Use   Smoking status: Never   Smokeless tobacco: Never  Vaping Use   Vaping status: Never Used  Substance and Sexual Activity   Alcohol use: No   Drug use: No   Sexual activity: Never  Other Topics Concern   Not on file  Social History Narrative   Not on file   Social Drivers of Health   Tobacco Use: Low Risk (07/15/2024)   Patient History    Smoking Tobacco Use: Never    Smokeless Tobacco Use: Never    Passive Exposure: Not on file  Financial Resource Strain: Not on file  Food Insecurity: No Food Insecurity (07/15/2024)   Epic    Worried About Programme Researcher, Broadcasting/film/video in the Last Year: Never true    Ran Out of Food in the Last Year: Never true  Transportation Needs: No Transportation Needs (07/15/2024)   Epic    Lack of Transportation (Medical): No    Lack of Transportation (Non-Medical): No  Physical Activity: Not on file  Stress: Not on file  Social Connections: Socially Integrated (07/15/2024)   Social Connection and Isolation Panel    Frequency of Communication with Friends and Family: More than three times a week    Frequency of Social Gatherings with Friends and Family: Three times a week    Attends Religious Services: More than 4 times per year    Active Member of Clubs or Organizations: Yes    Attends Banker Meetings: More than 4 times per year    Marital Status: Married  Catering Manager Violence: Not At Risk (07/15/2024)   Epic    Fear of Current or Ex-Partner: No    Emotionally Abused: No    Physically Abused: No    Sexually Abused: No  Depression (PHQ2-9): Low Risk (08/08/2024)   Depression (PHQ2-9)    PHQ-2 Score: 0  Alcohol Screen: Not on file  Housing: Low Risk (07/15/2024)   Epic    Unable to Pay for  Housing in the Last Year: No    Number of Times Moved in the Last Year: 0    Homeless in the Last Year: No  Utilities: Not At Risk (07/15/2024)   Epic    Threatened with loss of utilities: No  Health Literacy: Not on file    FAMILY HISTORY: Family History  Problem Relation Age of Onset   Ovarian cancer Mother    Diabetes Father     ALLERGIES:  is allergic to ferumoxytol .  MEDICATIONS:  Current Outpatient Medications  Medication Sig Dispense Refill   acetaminophen  (TYLENOL ) 325 MG tablet Take 325-650 mg by mouth every 6 (six) hours as needed for headache or mild pain.     acetic acid 0.25 % irrigation Irrigate with 1 Application as directed See admin instructions. Instill 50 ml's, clamp for 10 minutes, then remove clamp and drain the bladder. Repeat 3 times a week- when not using the Renacidin irrigation     acyclovir  (ZOVIRAX ) 400 MG tablet Take 1 tablet (400 mg total) by mouth 2 (two) times daily. 60 tablet 2   albuterol  (VENTOLIN  HFA) 108 (90 Base) MCG/ACT inhaler Inhale 2 puffs into the lungs every 4 (four) hours as needed for wheezing or shortness of breath.     atorvastatin  (LIPITOR) 20 MG tablet Take 20 mg by mouth daily.     BENADRYL  ALLERGY 25 MG tablet Take 25 mg by mouth every 6 (six) hours as needed (for allergic reactions).     BIOFREEZE 4 % GEL Apply 1 application  topically every 4 (four) hours as needed (for joint pain- to intact areas of the skin only).     Citric Ac-Gluconolact-Mg Carb (RENACIDIN IR) Irrigate with 30 mLs as directed See admin instructions. Instill 30 ml's, clamp for 10 minutes, then remove clamp and drain the bladder. Repeat 3 times a week- when not using the acetic acid irrigation     COSOPT  PF 2-0.5 % SOLN Place 1 drop into both eyes 2 (two) times daily.     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     doxycycline  (VIBRA -TABS) 100 MG tablet Take 1 tablet (100 mg  total) by mouth 2 (two) times daily. 14 tablet 0   Emollient (EUCERIN EX) Apply 1 application  topically as needed (for dryness).     FLOVENT HFA 110 MCG/ACT inhaler 2 each See admin instructions. 2 sprays to colostomy stoma prior to each colostomy bag change     guaiFENesin  (ROBITUSSIN) 100 MG/5ML liquid Take 10 mLs (200 mg total) by mouth 4 (four) times daily as needed for cough. 118 mL 0   iron  polysaccharides (NU-IRON ) 150 MG capsule Take 1 capsule (150 mg total) by mouth daily. 30 capsule 0   latanoprost  (XALATAN ) 0.005 % ophthalmic solution Place 1 drop into both eyes at bedtime.     Multiple Vitamin (MULTIVITAMIN WITH MINERALS) TABS tablet Take 1 tablet by mouth daily. 30 tablet 0   NON FORMULARY Apply 1 application  topically See admin instructions. Colo Plast paste- Apply as directed with each ostomy bag change     ondansetron  (ZOFRAN ) 4 MG tablet Take 1 tablet (4 mg) by mouth every 6 hours as needed for nausea. 20 tablet 0   rivaroxaban  (XARELTO ) 10 MG TABS tablet Take 1 tablet (10 mg total) by mouth daily with supper. (Patient taking differently: Take 10 mg by mouth daily.) 30 tablet 9   Skin Protectants, Misc. (MINERIN CREME) CREA Apply to affected areas 2 times daily as needed. 454 g 0   Zinc  Oxide (BALMEX EX) Apply 1 application  topically See admin instructions. Apply topically daily to affected sites     No current facility-administered medications for this visit.   Facility-Administered Medications Ordered in Other Visits  Medication Dose Route Frequency Provider Last Rate Last Admin   sodium chloride  flush (NS) 0.9 % injection 10 mL  10  mL Intravenous PRN Lonn Hicks, MD        REVIEW OF SYSTEMS:   Constitutional: ( - ) fevers, ( - )  chills , ( - ) night sweats Eyes: ( - ) blurriness of vision, ( - ) double vision, ( - ) watery eyes Ears, nose, mouth, throat, and face: ( - ) mucositis, ( - ) sore throat Respiratory: ( - ) cough, ( - ) dyspnea, ( - ) wheezes Cardiovascular:  ( - ) palpitation, ( - ) chest discomfort, ( - ) lower extremity swelling Gastrointestinal:  ( - ) nausea, ( - ) heartburn, ( - ) change in bowel habits Skin: ( - ) abnormal skin rashes Lymphatics: ( - ) new lymphadenopathy, ( - ) easy bruising Neurological: ( - ) numbness, ( - ) tingling, ( - ) new weaknesses Behavioral/Psych: ( - ) mood change, ( - ) new changes  All other systems were reviewed with the patient and are negative.  PHYSICAL EXAMINATION: ECOG PERFORMANCE STATUS: paraplegic.   Vitals:   08/08/24 1157  BP: 137/79  Pulse: 61  Resp: 13  Temp: (!) 97.5 F (36.4 C)  SpO2: 100%      Filed Weights   08/08/24 1157  Weight: 242 lb (109.8 kg)       GENERAL: well appearing middle aged African American male alert, no distress and comfortable SKIN: skin color, texture, turgor are normal, no rashes or significant lesions EYES: conjunctiva are pink and non-injected, sclera clear LUNGS: clear to auscultation and percussion with normal breathing effort HEART: regular rate & rhythm and no murmurs Musculoskeletal: no cyanosis of digits and no clubbing  PSYCH: alert & oriented x 3, fluent speech NEURO: paraplegic, no use of LE bilaterally.   LABORATORY DATA:  I have reviewed the data as listed    Latest Ref Rng & Units 08/08/2024   11:16 AM 08/01/2024    9:44 AM 07/25/2024    9:56 AM  CBC  WBC 4.0 - 10.5 K/uL 7.9  6.9  14.1   Hemoglobin 13.0 - 17.0 g/dL 89.8  89.0  9.7   Hematocrit 39.0 - 52.0 % 29.8  33.0  28.5   Platelets 150 - 400 K/uL 134  118  478        Latest Ref Rng & Units 08/08/2024   11:16 AM 08/01/2024    9:44 AM 07/25/2024    9:56 AM  CMP  Glucose 70 - 99 mg/dL 898  92  872   BUN 8 - 23 mg/dL 10  10  10    Creatinine 0.61 - 1.24 mg/dL 9.23  9.22  9.24   Sodium 135 - 145 mmol/L 144  145  144   Potassium 3.5 - 5.1 mmol/L 3.6  3.5  3.5   Chloride 98 - 111 mmol/L 111  111  110   CO2 22 - 32 mmol/L 23  24  21    Calcium  8.9 - 10.3 mg/dL 9.6  9.5  9.5   Total  Protein 6.5 - 8.1 g/dL 6.1  6.0  6.8   Total Bilirubin 0.0 - 1.2 mg/dL 0.7  0.9  0.7   Alkaline Phos 38 - 126 U/L 97  96  105   AST 15 - 41 U/L 15  18  22    ALT 0 - 44 U/L 16  17  33     Lab Results  Component Value Date   MPROTEIN 0.2 (H) 07/25/2024   MPROTEIN 0.1 (H) 06/27/2024   MPROTEIN  0.1 (H) 05/30/2024   Lab Results  Component Value Date   KPAFRELGTCHN 15.7 07/25/2024   KPAFRELGTCHN 7.0 06/27/2024   KPAFRELGTCHN 7.2 05/30/2024   LAMBDASER 15.5 07/25/2024   LAMBDASER 6.7 06/27/2024   LAMBDASER 5.6 (L) 05/30/2024   KAPLAMBRATIO 1.01 07/25/2024   KAPLAMBRATIO 1.04 06/27/2024   KAPLAMBRATIO 1.29 05/30/2024    RADIOGRAPHIC STUDIES: CT CHEST WO CONTRAST Result Date: 07/16/2024 CLINICAL DATA:  Provided history: Chest wall mass. EXAM: CT CHEST WITHOUT CONTRAST TECHNIQUE: Multidetector CT imaging of the chest was performed following the standard protocol without IV contrast. RADIATION DOSE REDUCTION: This exam was performed according to the departmental dose-optimization program which includes automated exposure control, adjustment of the mA and/or kV according to patient size and/or use of iterative reconstruction technique. COMPARISON:  Thoracic spine MRI earlier today FINDINGS: Cardiovascular: Limited assessment in the absence of IV contrast. The heart is enlarged. Aortic atherosclerosis. No aortic aneurysm. The central pulmonary arteries are dilated. No main pulmonary artery measures 4.3 cm. The lower lobe pulmonary arteries are also dilated, which likely accounts for the tubular structure on MRI. There are coronary artery calcifications. No pericardial effusion. Mediastinum/Nodes: No mediastinal adenopathy. Assessment for hilar adenopathy is limited in the absence of IV contrast. No evidence of mediastinal mass. Unremarkable esophagus. Lungs/Pleura: Small bilateral pleural effusions. Subsegmental atelectasis within both lower lobes. The trachea and airways are patent, no evidence of  bronchial filling. Ill-defined slightly branching opacity in the periphery of the right upper lobe, series 4, images 55 and 56, mild infection versus scarring. Upper Abdomen: Gallstones within contracted gallbladder. The filter is partially included. No acute upper abdominal findings. Musculoskeletal: T7 corpectomy with lateral fusion hardware T6 through T8. Focal kyphosis at this level. No periprosthetic lucency. Contour deformity of left lateral sixth rib and inferior scapula may represent sequela of remote injury or prior surgery. No chest wall soft tissue mass. Bilateral gynecomastia. IMPRESSION: 1. No evidence of chest wall mass. 2. Dilated central pulmonary arteries consistent with pulmonary arterial hypertension. The lower lobe pulmonary arteries are also dilated, which likely accounts for the tubular structure on MRI. 3. Small bilateral pleural effusions with subsegmental atelectasis in both lower lobes. 4. Ill-defined slightly branching opacity in the periphery of the right upper lobe, mild infection versus scarring. 5. Cardiomegaly with coronary artery calcifications. 6. Cholelithiasis. Aortic Atherosclerosis (ICD10-I70.0). Electronically Signed   By: Andrea Gasman M.D.   On: 07/16/2024 19:19   MR THORACIC SPINE W WO CONTRAST Result Date: 07/16/2024 EXAM: MR Thoracic Spine without and with IV contrast 07/16/2024 09:52:00 AM TECHNIQUE: Multiplanar multisequence MRI of the thoracic spine was performed without and with the administration of intravenous contrast. CONTRAST: 10 mL of gadobutrol  (GADAVIST ) 1 MMOL/ML injection. COMPARISON: MR Thoracic Spine without then with IV contrast 07/17/2010; MT Thoracic Spine without IV contrast 07/13/2010; Thoracic spine CT 03/13/2012. CLINICAL HISTORY: 69 year old male with remote T7 corpectomy, T6-T8 fusion, back pain, possible infection. FINDINGS: Limited sagittal imaging of the cervical spine is negative for age. BONES AND ALIGNMENT: Hardware susceptibility  artifact from T6 to T8, appears concordant with the postoperative CT appearance in 2013. Focal exaggerated thoracic kyphosis at those levels has not significantly changed. Background bone marrow signal remains normal. Mild chronic T12 superior endplate compression was present in 2013 and is stable. Similar mild T8 vertebral body loss of height is chronic and stable. No marrow edema or abnormal enhancement identified. SPINAL CORD: Confluent spinal cord myelomalacia and volume loss T6 through T8 (series 13 image 9 and series 16 image  23). The spinal cord myelomalacia abates at the T9 level. The background thoracic spinal cord volume is decreased in a generalized fashion. Conus medullaris appears to remain normal at T12-L1. No abnormal intradural enhancement or dural thickening identified. SOFT TISSUES: The left lung is abnormal, with cylindrical mass like lesion lateral to the descending thoracic aorta (series 20 image 18) but not well evaluated. This may be opacified, impacted, and expanded left lung airways which track into the left lower lobe. No pleural or pericardial effusion is present. No visible mediastinal lymphadenopathy. Negative visible upper abdominal viscera. Negative thoracic paraspinal soft tissues. DEGENERATIVE: Generally mild for age thoracic spine degeneration, and the predominant finding is thoracic degenerative posterior element hypertrophy. No thoracic spinal stenosis T1-T2 through T5-T6. Chronic postoperative changes T6 through T8. Superimposed dorsal epidural lipomatosis at those levels (series 18 image 23 at T7). Lower thoracic spinal disc bulging with endplate spurring and posterior element hypertrophy T9-T10 through T11-T12. But no significant spinal stenosis. IMPRESSION: 1. Left lung cylindrical and mass-like lesion lateral to the descending thoracic aorta. Perhaps these are impacted airways, but not well evaluated. Recommend Chest CT for better characterization. 2. No acute or inflammatory  process in the thoracic spine. 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 Electronically signed by: Helayne Hurst MD 07/16/2024 10:31 AM EST RP Workstation: [id number]   DG Chest Portable 1 View Result Date: 07/14/2024 EXAM: 1 VIEW(S) XRAY OF THE CHEST 07/14/2024 07:52:00 PM COMPARISON: AP and lateral chest 01/17/2024. CLINICAL HISTORY: ?pna pna FINDINGS: LUNGS AND PLEURA: Lungs are hypoinflated but generally clear. No pleural effusion. No pneumothorax. HEART AND MEDIASTINUM: There is mild cardiomegaly. There is no evidence of congestive heart failure (CHF). No acute abnormality of the mediastinal silhouette. BONES AND SOFT TISSUES: Irregularity in the posterolateral left 6th rib. Corpectomy and lateral side plate fusion hardware in the midthoracic spine with degenerative changes. IMPRESSION: 1. No acute cardiopulmonary findings. stable chest. Electronically signed by: Francis Quam MD 07/14/2024 08:09 PM EST RP Workstation: [id number]       ASSESSMENT & PLAN [name] is a 69 y.o.  male with medical history significant for  IgG Lambda Multiple Myeloma who presents for a follow up visit.  # IgG Lambda Multiple Myeloma, Relapsed (ISS Stage II) --findings are most consistent with relapsed multiple myeloma. Patient previously successfully treated with Velcade /Rev/Dex and Daratumumab /Velcade /Dex. On 07/02/2020 he transitioned to monthly daratumumab  alone.  --due to to rise in M protein, switched to Kyprolis  and Dexamethasone  on 01/06/2022 Plan: --Due for Cycle 32, Day 15 of Kyprolis /Dex today  --Labs show WBC 7.9, Hgb 10.1, MCV 81.9, Plt 134  --Most recent myeloma labs from 05/30/2024 shows M protein stable at 0.1 g/dL and kappa light chain 7.2, lambda light chain 5.6, ratio 1.29  --No clinical evidence of relapse at this time. --Proceed with treatment today without any dose modifications. --Continue with weekly labs plus Kyprolis /Dex treatment as  scheduled  #Anemia: --Likely secondary to MM +/- iron  deficiency --Currently on PO iron  therapy daily --Will recheck iron  panel periodically.   #History of Sepsis 2/2 UTI and Fournier's gangrene of scrotum: --Hospitalized from 12/22/2022-01/08/2023. Underwent debridement of necrotic tissue. --Given PIP tazobactam and Zyvox  initially --Urine culture reveals Proteus mirabilis which is resistant to ciprofloxacin  and nitrofurantoin --Blood cultures negative --D/C on remainder doxycycline  and Augmentin  which he completed on 01/07/2023.  --Evaluated by urology on 7/2 and ID on 7/3 with continued wound healing and no further d/c or evidence of infection.   #History of DVT --  He had placement of IVC filter, remains on Xarelto . --Due to poor mobility, and lack of bleeding complications, recommend to continue on Xarelto  indefinitely. --caution if Plt count were to drop <50  # Supportive Care --acyclovir  400mg  BID for VZV prophylaxis --zofran  8mg  q8H PRN for nausea/vomiting  --Zometa  is being held in the setting of his prior episode of osteonecrosis of the jaw  No orders of the defined types were placed in this encounter.   All questions were answered. The patient knows to call the clinic with any problems, questions or concerns.  I have spent a total of 30 minutes minutes of face-to-face and non-face-to-face time, preparing to see the patient,  performing a medically appropriate examination, counseling and educating the patient, communicating with other health care professionals, documenting clinical information in the electronic health record,  and care coordination.   Norleen IVAR Kidney, MD Department of Hematology/Oncology [facility] at [facility] Phone: [phone] Pager: [phone] Email: [email or web address]   08/10/2024 6:04 PM   Literature Support:  Bringhen S, Mina R, Petrucci MT, Gaidano G, Ballanti S, Musto P, Offidani M, Spada S, Benevolo G,  Ponticelli E, Galieni P, Cavo M, Di Toritto TC, Di Raimondo F, Montefusco V, Palumbo A, Boccadoro M, Larocca A. Once-weekly versus twice-weekly carfilzomib  in patients with newly diagnosed multiple myeloma: a pooled analysis of two phase I/II studies. Haematologica. 2019 Aug;104(8):1640-1647.  --Once-weekly 70 mg/m2 carfilzomib  as induction and maintenance therapy for newly diagnosed multiple myeloma patients was as safe and effective as twice-weekly 36 mg/m2 carfilzomib  and provided a more convenient schedule. "

## 2024-08-10 ENCOUNTER — Encounter: Payer: Self-pay | Admitting: Hematology and Oncology

## 2024-08-11 ENCOUNTER — Encounter: Payer: Self-pay | Admitting: Hematology and Oncology

## 2024-08-11 ENCOUNTER — Encounter: Payer: Self-pay | Admitting: Nurse Practitioner

## 2024-08-18 ENCOUNTER — Emergency Department (HOSPITAL_COMMUNITY): Payer: Medicare (Managed Care)

## 2024-08-18 ENCOUNTER — Inpatient Hospital Stay (HOSPITAL_COMMUNITY)
Admission: EM | Admit: 2024-08-18 | Discharge: 2024-08-21 | DRG: 871 | Disposition: A | Discharge status: Home Health | Payer: Medicare (Managed Care) | Attending: Internal Medicine | Admitting: Internal Medicine

## 2024-08-18 ENCOUNTER — Encounter (HOSPITAL_COMMUNITY): Payer: Self-pay

## 2024-08-18 DIAGNOSIS — E119 Type 2 diabetes mellitus without complications: Secondary | ICD-10-CM | POA: Diagnosis present

## 2024-08-18 DIAGNOSIS — D638 Anemia in other chronic diseases classified elsewhere: Secondary | ICD-10-CM | POA: Diagnosis present

## 2024-08-18 DIAGNOSIS — R509 Fever, unspecified: Secondary | ICD-10-CM | POA: Diagnosis present

## 2024-08-18 DIAGNOSIS — Z7901 Long term (current) use of anticoagulants: Secondary | ICD-10-CM

## 2024-08-18 DIAGNOSIS — A419 Sepsis, unspecified organism: Principal | ICD-10-CM | POA: Diagnosis present

## 2024-08-18 DIAGNOSIS — Z8041 Family history of malignant neoplasm of ovary: Secondary | ICD-10-CM

## 2024-08-18 DIAGNOSIS — Y846 Urinary catheterization as the cause of abnormal reaction of the patient, or of later complication, without mention of misadventure at the time of the procedure: Secondary | ICD-10-CM | POA: Diagnosis present

## 2024-08-18 DIAGNOSIS — E782 Mixed hyperlipidemia: Secondary | ICD-10-CM

## 2024-08-18 DIAGNOSIS — D630 Anemia in neoplastic disease: Secondary | ICD-10-CM | POA: Diagnosis present

## 2024-08-18 DIAGNOSIS — A4189 Other specified sepsis: Secondary | ICD-10-CM | POA: Diagnosis present

## 2024-08-18 DIAGNOSIS — B952 Enterococcus as the cause of diseases classified elsewhere: Secondary | ICD-10-CM | POA: Diagnosis present

## 2024-08-18 DIAGNOSIS — U071 COVID-19: Secondary | ICD-10-CM | POA: Diagnosis not present

## 2024-08-18 DIAGNOSIS — Z933 Colostomy status: Secondary | ICD-10-CM

## 2024-08-18 DIAGNOSIS — Z79899 Other long term (current) drug therapy: Secondary | ICD-10-CM

## 2024-08-18 DIAGNOSIS — K592 Neurogenic bowel, not elsewhere classified: Secondary | ICD-10-CM | POA: Diagnosis present

## 2024-08-18 DIAGNOSIS — G8220 Paraplegia, unspecified: Secondary | ICD-10-CM | POA: Diagnosis present

## 2024-08-18 DIAGNOSIS — Z86718 Personal history of other venous thrombosis and embolism: Secondary | ICD-10-CM

## 2024-08-18 DIAGNOSIS — D689 Coagulation defect, unspecified: Secondary | ICD-10-CM | POA: Diagnosis present

## 2024-08-18 DIAGNOSIS — G4733 Obstructive sleep apnea (adult) (pediatric): Secondary | ICD-10-CM | POA: Diagnosis present

## 2024-08-18 DIAGNOSIS — N319 Neuromuscular dysfunction of bladder, unspecified: Secondary | ICD-10-CM | POA: Diagnosis present

## 2024-08-18 DIAGNOSIS — E785 Hyperlipidemia, unspecified: Secondary | ICD-10-CM | POA: Diagnosis present

## 2024-08-18 DIAGNOSIS — B957 Other staphylococcus as the cause of diseases classified elsewhere: Secondary | ICD-10-CM | POA: Diagnosis present

## 2024-08-18 DIAGNOSIS — E66812 Obesity, class 2: Secondary | ICD-10-CM | POA: Diagnosis present

## 2024-08-18 DIAGNOSIS — R5381 Other malaise: Secondary | ICD-10-CM | POA: Diagnosis present

## 2024-08-18 DIAGNOSIS — T83511A Infection and inflammatory reaction due to indwelling urethral catheter, initial encounter: Principal | ICD-10-CM | POA: Diagnosis present

## 2024-08-18 DIAGNOSIS — C9000 Multiple myeloma not having achieved remission: Secondary | ICD-10-CM | POA: Diagnosis not present

## 2024-08-18 DIAGNOSIS — H409 Unspecified glaucoma: Secondary | ICD-10-CM | POA: Diagnosis present

## 2024-08-18 DIAGNOSIS — R8281 Pyuria: Secondary | ICD-10-CM | POA: Diagnosis present

## 2024-08-18 DIAGNOSIS — N3000 Acute cystitis without hematuria: Secondary | ICD-10-CM | POA: Diagnosis present

## 2024-08-18 DIAGNOSIS — Z6836 Body mass index (BMI) 36.0-36.9, adult: Secondary | ICD-10-CM

## 2024-08-18 DIAGNOSIS — Z833 Family history of diabetes mellitus: Secondary | ICD-10-CM

## 2024-08-18 LAB — COMPREHENSIVE METABOLIC PANEL WITH GFR
ALT: 15 U/L (ref 0–44)
AST: 23 U/L (ref 15–41)
Albumin: 3.6 g/dL (ref 3.5–5.0)
Alkaline Phosphatase: 86 U/L (ref 38–126)
Anion gap: 12 (ref 5–15)
BUN: 14 mg/dL (ref 8–23)
CO2: 25 mmol/L (ref 22–32)
Calcium: 8.9 mg/dL (ref 8.9–10.3)
Chloride: 102 mmol/L (ref 98–111)
Creatinine, Ser: 0.96 mg/dL (ref 0.61–1.24)
GFR, Estimated: >60 mL/min
Glucose, Bld: 100 mg/dL — ABNORMAL HIGH (ref 70–99)
Potassium: 3.8 mmol/L (ref 3.5–5.1)
Sodium: 139 mmol/L (ref 135–145)
Total Bilirubin: 0.6 mg/dL (ref 0.0–1.2)
Total Protein: 6.4 g/dL — ABNORMAL LOW (ref 6.5–8.1)

## 2024-08-18 LAB — URINALYSIS, ROUTINE W REFLEX MICROSCOPIC
Bilirubin Urine: NEGATIVE
Glucose, UA: NEGATIVE mg/dL
Ketones, ur: NEGATIVE mg/dL
Nitrite: POSITIVE — AB
Protein, ur: >=300 mg/dL — AB
Specific Gravity, Urine: 1.018 (ref 1.005–1.030)
WBC, UA: >50 WBC/hpf (ref 0–5)
pH: 5 (ref 5.0–8.0)

## 2024-08-18 LAB — RESP PANEL BY RT-PCR (RSV, FLU A&B, COVID)  RVPGX2
Influenza A by PCR: NEGATIVE
Influenza B by PCR: NEGATIVE
Resp Syncytial Virus by PCR: NEGATIVE
SARS Coronavirus 2 by RT PCR: POSITIVE — AB

## 2024-08-18 LAB — CBC WITH DIFFERENTIAL/PLATELET
Abs Immature Granulocytes: 0.06 10*3/uL (ref 0.00–0.07)
Basophils Absolute: 0.1 10*3/uL (ref 0.0–0.1)
Basophils Relative: 1 %
Eosinophils Absolute: 0 10*3/uL (ref 0.0–0.5)
Eosinophils Relative: 0 %
HCT: 31.8 % — ABNORMAL LOW (ref 39.0–52.0)
Hemoglobin: 10.7 g/dL — ABNORMAL LOW (ref 13.0–17.0)
Immature Granulocytes: 1 %
Lymphocytes Relative: 6 %
Lymphs Abs: 0.4 10*3/uL — ABNORMAL LOW (ref 0.7–4.0)
MCH: 28.2 pg (ref 26.0–34.0)
MCHC: 33.6 g/dL (ref 30.0–36.0)
MCV: 83.9 fL (ref 80.0–100.0)
Monocytes Absolute: 1.5 10*3/uL — ABNORMAL HIGH (ref 0.1–1.0)
Monocytes Relative: 22 %
Neutro Abs: 4.9 10*3/uL (ref 1.7–7.7)
Neutrophils Relative %: 70 %
Platelets: 275 10*3/uL (ref 150–400)
RBC: 3.79 MIL/uL — ABNORMAL LOW (ref 4.22–5.81)
RDW: 19.9 % — ABNORMAL HIGH (ref 11.5–15.5)
WBC: 7 10*3/uL (ref 4.0–10.5)
nRBC: 0 % (ref 0.0–0.2)

## 2024-08-18 LAB — CBG MONITORING, ED: Glucose-Capillary: 113 mg/dL — ABNORMAL HIGH (ref 70–99)

## 2024-08-18 LAB — I-STAT CG4 LACTIC ACID, ED
Lactic Acid, Venous: 1.4 mmol/L (ref 0.5–1.9)
Lactic Acid, Venous: 0.6 mmol/L (ref 0.5–1.9)

## 2024-08-18 LAB — PROTIME-INR
INR: 3.1 — ABNORMAL HIGH (ref 0.8–1.2)
Prothrombin Time: 33.2 s — ABNORMAL HIGH (ref 11.4–15.2)

## 2024-08-18 LAB — MAGNESIUM: Magnesium: 1.6 mg/dL — ABNORMAL LOW (ref 1.7–2.4)

## 2024-08-18 MED ORDER — ACETAMINOPHEN 325 MG PO TABS
650.0000 mg | ORAL_TABLET | Freq: Four times a day (QID) | ORAL | Status: DC | PRN
  Administered 2024-08-20: 650 mg via ORAL
  Filled 2024-08-18: qty 2

## 2024-08-18 MED ORDER — ACETAMINOPHEN 650 MG RE SUPP: 650.0000 mg | Freq: Four times a day (QID) | RECTAL | Status: DC | PRN

## 2024-08-18 MED ORDER — GUAIFENESIN ER 600 MG PO TB12
600.0000 mg | ORAL_TABLET | Freq: Two times a day (BID) | ORAL | Status: DC
  Administered 2024-08-18 – 2024-08-21 (×6): 600 mg via ORAL
  Filled 2024-08-18 (×6): qty 1

## 2024-08-18 MED ORDER — BENZONATATE 100 MG PO CAPS
200.0000 mg | ORAL_CAPSULE | Freq: Three times a day (TID) | ORAL | Status: DC | PRN
  Administered 2024-08-20: 200 mg via ORAL
  Filled 2024-08-18: qty 2

## 2024-08-18 MED ORDER — ACETAMINOPHEN 500 MG PO TABS
1000.0000 mg | ORAL_TABLET | Freq: Once | ORAL | Status: AC
  Administered 2024-08-18: 1000 mg via ORAL
  Filled 2024-08-18: qty 2

## 2024-08-18 MED ORDER — MENTHOL 3 MG MT LOZG
1.0000 | LOZENGE | OROMUCOSAL | Status: DC | PRN
  Administered 2024-08-20: 3 mg via ORAL
  Filled 2024-08-18: qty 9

## 2024-08-18 MED ORDER — DIAZEPAM 5 MG PO TABS: 5.0000 mg | ORAL_TABLET | Freq: Two times a day (BID) | ORAL | Status: DC | PRN

## 2024-08-18 MED ORDER — LACTATED RINGERS IV SOLN: INTRAVENOUS | Status: AC

## 2024-08-18 MED ORDER — VANCOMYCIN HCL IN DEXTROSE 1-5 GM/200ML-% IV SOLN: 1000.0000 mg | Freq: Once | INTRAVENOUS | Status: DC

## 2024-08-18 MED ORDER — LACTATED RINGERS IV BOLUS
1000.0000 mL | Freq: Once | INTRAVENOUS | Status: AC
  Administered 2024-08-18: 1000 mL via INTRAVENOUS

## 2024-08-18 MED ORDER — SODIUM CHLORIDE 0.9 % IV SOLN
2.0000 g | Freq: Once | INTRAVENOUS | Status: AC
  Administered 2024-08-18: 2 g via INTRAVENOUS
  Filled 2024-08-18: qty 12.5

## 2024-08-18 MED ORDER — INSULIN ASPART 100 UNIT/ML IJ SOLN: 0.0000 [IU] | Freq: Three times a day (TID) | INTRAMUSCULAR | Status: DC

## 2024-08-18 MED ORDER — ALBUTEROL SULFATE (2.5 MG/3ML) 0.083% IN NEBU: 2.5000 mg | INHALATION_SOLUTION | Freq: Four times a day (QID) | RESPIRATORY_TRACT | Status: DC | PRN

## 2024-08-18 MED ORDER — SODIUM CHLORIDE 0.9 % IV SOLN
1.0000 g | INTRAVENOUS | Status: DC
  Administered 2024-08-18 – 2024-08-19 (×2): 1 g via INTRAVENOUS
  Filled 2024-08-18 (×2): qty 10

## 2024-08-18 MED ORDER — VANCOMYCIN HCL 1750 MG/350ML IV SOLN
1750.0000 mg | Freq: Once | INTRAVENOUS | Status: AC
  Administered 2024-08-18: 1750 mg via INTRAVENOUS
  Filled 2024-08-18: qty 350

## 2024-08-18 MED ORDER — LATANOPROST 0.005 % OP SOLN
1.0000 [drp] | Freq: Every day | OPHTHALMIC | Status: DC
  Administered 2024-08-19 – 2024-08-20 (×3): 1 [drp] via OPHTHALMIC
  Filled 2024-08-18: qty 2.5

## 2024-08-18 MED ORDER — MAGNESIUM SULFATE 2 GM/50ML IV SOLN
2.0000 g | Freq: Once | INTRAVENOUS | Status: AC
  Administered 2024-08-18: 2 g via INTRAVENOUS
  Filled 2024-08-18: qty 50

## 2024-08-18 MED ORDER — ATORVASTATIN CALCIUM 40 MG PO TABS
20.0000 mg | ORAL_TABLET | Freq: Every day | ORAL | Status: DC
  Administered 2024-08-19 – 2024-08-21 (×3): 20 mg via ORAL
  Filled 2024-08-18 (×3): qty 2

## 2024-08-18 MED ORDER — DORZOLAMIDE HCL-TIMOLOL MAL 2-0.5 % OP SOLN
1.0000 [drp] | Freq: Two times a day (BID) | OPHTHALMIC | Status: DC
  Administered 2024-08-19 – 2024-08-21 (×6): 1 [drp] via OPHTHALMIC
  Filled 2024-08-18: qty 10

## 2024-08-18 MED ORDER — ONDANSETRON HCL 4 MG/2ML IJ SOLN: 4.0000 mg | Freq: Four times a day (QID) | INTRAMUSCULAR | Status: DC | PRN

## 2024-08-18 MED ORDER — MELATONIN 3 MG PO TABS
3.0000 mg | ORAL_TABLET | Freq: Every evening | ORAL | Status: DC | PRN
  Administered 2024-08-20: 3 mg via ORAL
  Filled 2024-08-18: qty 1

## 2024-08-18 MED ORDER — RIVAROXABAN 10 MG PO TABS
10.0000 mg | ORAL_TABLET | Freq: Every day | ORAL | Status: DC
  Administered 2024-08-18 – 2024-08-20 (×3): 10 mg via ORAL
  Filled 2024-08-18 (×4): qty 1

## 2024-08-18 NOTE — H&P (Incomplete)
 " History and Physical      [name] Howell FMW:[id number] DOB: 1956-01-15 DOA: 08/18/2024; DOS: 08/18/2024  PCP: Cloria Annabella CROME, DO  Patient coming from: home   I have personally briefly reviewed patient's old medical records in [facility]  Chief Complaint: Cough  HPI: [name] is a 69 y.o. 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   The patient reports 3 to 4 days of new onset mildly productive cough associated with rhinitis, rhinorrhea, subjective fever, generalized myalgias, all of which started on Friday, 08/15/2024.  He denies any associated headache, neck stiffness, nor any associated chest pain, palpitations, diaphoresis, shortness of breath, nor any hemoptysis.  He also reports diminished appetite over the last few days resulting in decline in oral intake over that timeframe.  He was hospitalized in the [facility] health system in December 2025 from 07/14/2024 to 07/18/2024 for infectious symptoms, which were suspected to be on the basis of pneumonia.  Urinalysis drawn at the time of that hospitalization on 07/14/2024 off of his suprapubic Foley catheter demonstrated greater than 50 of blood cells, many bacteria, moderate leukocyte esterase and nitrite positive findings.  Ensuing urine culture was polymicrobial and felt to be on the basis of contamination/colonization.   Reportedly lifelong non-smoker.  No known history of chronic underlying pulmonary pathology.  Medical history also notable for multiple myeloma for which he follows with outpatient oncology.History of recurrent DVTs, now chronically  anticoagulated on Xarelto .  Per chart review, no prior echocardiogram on file.    ED Course:  Vital signs in the ED were notable for the following: Temperature max 102.2, subsequently decreasing to 99.1 following interval acetaminophen , heart rates in the 80s to 90s; systolic blood pressures in the 120s 140s; respiratory rate 16-28, oxygen saturation 98 to 99% on room air.  Labs were notable for the following: CMP notable for sodium 139, bicarbonate 25, creatinine 0.96 compared to 0.76 on 08/08/2024, glucose 100, calcium  adjusted to 9.2 per presenting albumin  result of 3.6.  Otherwise, liver enzymes within normal limits.  Magnesium  1.6.  Initial lactate 1.4 with repeat trending down to 0.6.  CBC notable for white cell count 7000 with 70% neutrophils, hemoglobin 10.7 associated Neuraceq/Norocarp properties and relative to most recent prior hemoglobin data point of 10.1 on 08/08/2024 completely up to 7 5.  INR 3.1.  Urinalysis, which was drawn off of a new suprapubic Foley catheter following replacement in the ED this evening, demonstrated the following results: Greater than 50 white blood cells, many bacteria, moderate leukocyte esterase, nitrate positive, no squamous epithelial cells also showing moderate hemoglobin with 11-20 RBCs.  Blood cultures x 2 and urine culture were collected prior to initiation of any IV antibiotics.  COVID PCR was found be positive for influenza A/B PCR as well as RSV PCR were found to be negative.  Per my interpretation, EKG in ED demonstrated the following: No EKG performed in the ED today.  Imaging in the ED, per corresponding formal radiology read, was notable for the following: 1 view chest x-ray showed no evidence of acute cardiopulmonary process, including no evidence of infiltrate, edema, effusion, or pneumothorax.  While in the ED,  the following were administered: Acetaminophen  1 g p.o. x 1 dose, lactated Ringer 's x 1 L bolus, magnesium  sulfate 2 g IV over 2 hours,  IV vancomycin , IV cefepime .  Subsequently, the patient was admitted for further evaluation management of sepsis due to COVID infection with presenting labs also notable for hypomagnesemia.     Review of Systems: As per HPI otherwise 10 point review of systems negative.   Past Medical History:  Diagnosis Date   Adrenal insufficiency    on chronic dexamethasone    Anemia    Cancer (HCC)    Coagulopathy    on xeralto/ s/p DVT while on coumadin,  IVC in place   Diabetes mellitus without complication (HCC)    type 2   Glaucoma 12/22/2022   Gross hematuria 01/2013   post foley cath procedure   History of blood transfusion 01/2013   Lower GI bleeding 04/18/2017   Multiple myeloma    thoracic T8 with paraplegia s/p resection- on chemo at visit 10/13/10   Multiple myeloma    Multiple myeloma without mention of remission    Neurogenic bladder    Neurogenic bowel    OSA (obstructive sleep apnea) 11/01/2022   Paraplegia (HCC)    Partial small bowel obstruction (HCC) during dec 2011 admission    Past Surgical History:  Procedure Laterality Date   COLONOSCOPY WITH PROPOFOL  N/A 04/12/2017   Procedure: COLONOSCOPY WITH PROPOFOL ;  Surgeon: Abran Norleen SAILOR, MD;  Location: [facility];  Service: Endoscopy;  Laterality: N/A;   COLONOSCOPY WITH PROPOFOL  N/A 04/19/2017   Procedure: COLONOSCOPY WITH PROPOFOL ;  Surgeon: Leigh Elspeth SQUIBB, MD;  Location: [facility];  Service: Gastroenterology;  Laterality: N/A;   COLOSTOMY  07/20/2011   Procedure: COLOSTOMY;  Surgeon: Redell Alm Faith, DO;  Location: [facility] OR;  Service: General;;   COLOSTOMY REVISION  07/20/2011   Procedure: COLON RESECTION SIGMOID;  Surgeon: Redell Alm Faith, DO;  Location: [facility] OR;  Service: General;;   CYSTOSCOPY N/A 04/04/2013   Procedure: CYSTOSCOPY WITH LITHALOPAXY;  Surgeon: Ricardo Likens, MD;  Location: [facility] ORS;  Service: Urology;  Laterality: N/A;   INCISION AND DRAINAGE ABSCESS N/A 12/25/2022   Procedure: INCISION AND  DRAINAGE OF SCROTUM;  Surgeon: Lovie Arlyss CROME, MD;  Location: [facility];  Service: Urology;  Laterality: N/A;   INSERTION OF SUPRAPUBIC CATHETER N/A 04/04/2013   Procedure: INSERTION OF SUPRAPUBIC CATHETER;  Surgeon: Ricardo Likens, MD;  Location: [facility];  Service: Urology;  Laterality: N/A;   LAPAROTOMY  07/20/2011   Procedure: EXPLORATORY LAPAROTOMY;  Surgeon: Redell Alm Faith, DO;  Location: [facility] OR;  Service: General;  Laterality: N/A;   myeloma thoracic T8 with parpaplegia s/p thoracotomy and thoracic T7-9 cage placement on Dec 26th 2011  07/18/10   SCROTAL EXPLORATION N/A 12/29/2022   Procedure: SCROTAL WOUND DEBRIDEMENT AND CLOSURE;  Surgeon: Likens Ricardo KATHEE Mickey., MD;  Location: [facility];  Service: Urology;  Laterality: N/A;    Social History:  reports that he has never smoked. He has never used smokeless tobacco. He reports that he does not drink alcohol and does not use drugs.   Allergies[1]  Family History  Problem Relation Age of Onset   Ovarian cancer Mother    Diabetes Father     Family history reviewed and not pertinent    Prior to Admission medications  Medication Sig Start Date End Date Taking? Authorizing Provider  acetaminophen  (TYLENOL ) 325 MG tablet Take 325-650 mg by mouth every 6 (six) hours as needed for headache or  mild pain. 09/21/20   [provider]  acetic acid 0.25 % irrigation Irrigate with 1 Application as directed See admin instructions. Instill 50 ml's, clamp for 10 minutes, then remove clamp and drain the bladder. Repeat 3 times a week- when not using the Renacidin irrigation    [provider]  acyclovir  (ZOVIRAX ) 400 MG tablet Take 1 tablet (400 mg total) by mouth 2 (two) times daily. 08/18/20   Federico Norleen ONEIDA MADISON, MD  albuterol  (VENTOLIN  HFA) 108 [phone] Base) MCG/ACT inhaler Inhale 2 puffs into the lungs every 4 (four) hours as needed for wheezing or shortness of breath.    [provider]  atorvastatin  (LIPITOR) 20 MG tablet Take 20  mg by mouth daily.    [provider]  BENADRYL  ALLERGY 25 MG tablet Take 25 mg by mouth every 6 (six) hours as needed (for allergic reactions).    [provider]  BIOFREEZE 4 % GEL Apply 1 application  topically every 4 (four) hours as needed (for joint pain- to intact areas of the skin only).    [provider]  Citric Ac-Gluconolact-Mg Carb (RENACIDIN IR) Irrigate with 30 mLs as directed See admin instructions. Instill 30 ml's, clamp for 10 minutes, then remove clamp and drain the bladder. Repeat 3 times a week- when not using the acetic acid irrigation    [provider]  COSOPT  PF 2-0.5 % SOLN Place 1 drop into both eyes 2 (two) times daily.    [provider]  diazepam  (VALIUM ) 5 MG tablet Take 5 mg by mouth 2 (two) times daily as needed for muscle spasms (after spinal cord injury).    [provider]  doxycycline  (VIBRA -TABS) 100 MG tablet Take 1 tablet (100 mg total) by mouth 2 (two) times daily. 07/25/24   Boscia, Heather E, NP  Emollient (EUCERIN EX) Apply 1 application  topically as needed (for dryness).    [provider]  FLOVENT HFA 110 MCG/ACT inhaler 2 each See admin instructions. 2 sprays to colostomy stoma prior to each colostomy bag change    [provider]  guaiFENesin  (ROBITUSSIN) 100 MG/5ML liquid Take 10 mLs (200 mg total) by mouth 4 (four) times daily as needed for cough. 07/18/24   Drusilla Sabas RAMAN, MD  iron  polysaccharides (NU-IRON ) 150 MG capsule Take 1 capsule (150 mg total) by mouth daily. 04/22/17   Regalado, Belkys A, MD  latanoprost  (XALATAN ) 0.005 % ophthalmic solution Place 1 drop into both eyes at bedtime.    [provider]  Multiple Vitamin (MULTIVITAMIN WITH MINERALS) TABS tablet Take 1 tablet by mouth daily. 01/08/23   Lue Elsie BROCKS, MD  NON FORMULARY Apply 1 application  topically See admin instructions. Colo Plast paste- Apply as directed with each ostomy bag change    [provider]  ondansetron  (ZOFRAN ) 4 MG tablet Take 1 tablet (4 mg) by mouth every 6 hours as needed for nausea. 01/08/23   Lue Elsie BROCKS, MD  rivaroxaban  (XARELTO ) 10 MG TABS tablet Take 1 tablet (10 mg total) by mouth daily with supper. Patient taking differently: Take 10 mg by mouth daily. 03/26/18   Lonn Hicks, MD  Skin Protectants, Misc. (MINERIN CREME) CREA Apply to affected areas 2 times daily as needed. 01/08/23   Lue Elsie BROCKS, MD  Zinc  Oxide [facility] EX) Apply 1 application  topically See admin instructions. Apply topically daily to affected sites    [provider]     Objective  Physical Exam: Vitals:   08/18/24 1945 08/18/24 2000 08/18/24 2100 08/18/24 2125  BP: 137/74 (!) 144/69 126/68   Pulse: 92 92 94   Resp: 20 16 (!) 25   Temp:    99.1 F (37.3 C)  TempSrc:    Oral  SpO2: 99% 98% 99%     General: appears to be stated age; alert Skin: warm, dry, no rash Head:  AT/[place] Mouth:  Oral mucosa membranes appear dry, normal dentition Neck: supple; trachea midline Heart:  RRR; did not appreciate any M/R/G Lungs: CTAB, did not appreciate any wheezes, rales, or rhonchi Abdomen: + BS; soft, ND, NT Extremities: no peripheral edema    Labs on Admission: I have personally reviewed following labs and imaging studies  CBC: Recent Labs  Lab 08/18/24 1825  WBC 7.0  NEUTROABS 4.9  HGB 10.7*  HCT 31.8*  MCV 83.9  PLT 275   Basic Metabolic Panel: Recent Labs  Lab 08/18/24 1825  NA 139  K 3.8  CL 102  CO2 25  GLUCOSE 100*  BUN 14  CREATININE 0.96  CALCIUM  8.9  MG 1.6*   GFR: Estimated Creatinine Clearance: 86.1 mL/min (by C-G formula based on SCr of 0.96 mg/dL). Liver Function Tests: Recent Labs  Lab 08/18/24 1825  AST 23  ALT 15  ALKPHOS 86  BILITOT 0.6  PROT 6.4*  ALBUMIN  3.6   No results for input(s): LIPASE, AMYLASE in the last 168 hours. No results for input(s): AMMONIA in the last 168 hours. Coagulation  Profile: Recent Labs  Lab 08/18/24 1830  INR 3.1*   Cardiac Enzymes: No results for input(s): CKTOTAL, CKMB, CKMBINDEX, TROPONINI in the last 168 hours. BNP (last 3 results) No results for input(s): PROBNP in the last 8760 hours. HbA1C: No results for input(s): HGBA1C in the last 72 hours. CBG: No results for input(s): GLUCAP in the last 168 hours. Lipid Profile: No results for input(s): CHOL, HDL, LDLCALC, TRIG, CHOLHDL, LDLDIRECT in the last 72 hours. Thyroid Function Tests: No results for input(s): TSH, T4TOTAL, FREET4, T3FREE, THYROIDAB in the last 72 hours. Anemia Panel: No results for input(s): VITAMINB12, FOLATE, FERRITIN, TIBC, IRON , RETICCTPCT in the last 72 hours. Urine analysis:    Component Value Date/Time   COLORURINE YELLOW 08/18/2024 1917   APPEARANCEUR TURBID (A) 08/18/2024 1917   LABSPEC 1.018 08/18/2024 1917   PHURINE 5.0 08/18/2024 1917   GLUCOSEU NEGATIVE 08/18/2024 1917   HGBUR MODERATE (A) 08/18/2024 1917   HGBUR small 07/08/2010 0831   BILIRUBINUR NEGATIVE 08/18/2024 1917   KETONESUR NEGATIVE 08/18/2024 1917   PROTEINUR >=300 (A) 08/18/2024 1917   UROBILINOGEN 0.2 07/21/2014 1123   NITRITE POSITIVE (A) 08/18/2024 1917   LEUKOCYTESUR MODERATE (A) 08/18/2024 1917    Radiological Exams on Admission: DG Chest Portable 1 View Result Date: 08/18/2024 EXAM: 1 VIEW(S) XRAY OF THE CHEST 08/18/2024 06:17:33 PM COMPARISON: 07/14/2024 CLINICAL HISTORY: Shortness of breath. FINDINGS: LUNGS AND PLEURA: No focal pulmonary opacity. No pleural effusion. No pneumothorax. HEART AND MEDIASTINUM: The cardiac shadow is stable. BONES AND SOFT TISSUES: Postsurgical changes are noted in the mid thoracic spine as well as the left chest wall. No acute osseous abnormality is noted. IMPRESSION: 1. No acute process. Electronically signed by: Oneil Devonshire MD 08/18/2024 06:20 PM EST RP Workstation: [id number]      Assessment/Plan    Principal Problem:   COVID-19 virus infection Active Problems:   Paraplegia (HCC)   Multiple myeloma (HCC)   Anemia of chronic disease   History of  DVT (deep vein thrombosis)   DM2 (diabetes mellitus, type 2) (HCC)   Fever   Sepsis (HCC)   Hypomagnesemia   Pyuria   HLD (hyperlipidemia)       #) Sepsis due to COVID-19 infection: diagnosis on the basis of: 3 to 4 days of new onset subjective fever, generalized myalgias, associated with new onset mild productive cough, rhinitis, rhinorrhea, with COVID-19 PCR found to be positive in the ED today, and noted to be objectively febrile in the ED, with temperature max of 102.2. Of note, presentation does not appear to be associated with acute hypoxic respiratory distress/failure, with patient maintaining O2 sats greater than 94% on room air, with O2 sats in the ED thus far noted to be in the high 90s on room air. Additionally, CXR shows no evidence of acute cardiopulmonary process, including no evidence of infiltrate to suggest an element of bacterial pneumonia.   Overall, it does not appear that criteria are met at the present time for patient's COVID-19 infection to be considered severe in nature. Consequently, there does not appear to be an indication at this time for initiation of systemic corticosteroids per treatment guidance recommendations from [place]'s Covid Treatment Guidelines. Will trend inflammatory markers, as outlined below.   Additionally, as anti-viral medications, including remdesivir, Paxlovid, and molnupiravir, have showed limited benefit in the treatment of COVID-19 infection, including limited benefit in reducing the probability for progression of the severity associated with this infection, will refrain from initiation of any of these antiviral medications at this time.   SIRS criteria met.  Presenting objective fever, tachycardia and tachypnea.  Presenting lactate 1.4 with repeat trending down to 0.6.  In the absence  of any evidence of endorgan damage, including nonelevated lactic acid, criteria are not met at this time for the patient's sepsis to be considered severe in nature.  In the absence of lactate greater than or equal to 4.0 and in the absence of hypotension refractory to IV fluids, there is no indication at this time for administration of a 30 mL/kg ivf bolus.  In terms of evaluating further potential contributory infectious processes, his urinalysis off a newly replaced suprapubic Foley catheter in the ED, shows significant pyuria, with greater than 50 white blood cells, many bacteria, moderate leukocyte esterase and nitrate positive findings without any squamous epithelial cells.  However, these findings appear very similar to those associated with most recent prior urinalysis drawn on 07/14/2024, 1 urine culture subsequently demonstrated polymicrobial findings suggestive of contamination/colonization.  Suspect, given the patient's chronic suprapubic Foley catheter, that today's urinalysis results will be on the basis of colonization as well, particular given an alternate source of infection to explain his subjective fever, tachycardia, tachypnea in the form of COVID-19 infection.  However, given neutrophilic predominance on his presenting CBC, we will continue Rocephin  for now, and closely monitor for results of urine culture to help determine ensuing course with antibiotics.   Of note, in the ED today, blood cultures x 2 and urine culture were collected prior to initiation of IV vancomycin  and cefepime .    Plan: Airborne and contact precautions.  Monitor on telemetry.  prn supplemental O2 to maintain O2 sats greater than or equal to 94%.  PRN albuterol  nebulizer. PRN acetaminophen  for fever. Refraining from initiation of systemic corticosteroids and antivirals for now, as above. Check CRP now, with repeat level to be checked in the AM. Check phosphorus levels. Check CMP and CBC in the morning. Flutter valve  and incentive spirometry.  check procalcitonin, which, if negative in the context of the pro inflammatory state associated with COVID-19, would provide a degree of negative predictive value that would further render the possibility of bacterial pneumonia to be less likely.  Lactated Ringer 's at 100 cc/h x 12 hours.  Follow for results of blood cultures x 2.  Follow-up result of urine culture.  For now, we will pursue Rocephin , while closely monitoring results of urine culture from new suprapubic Foley catheter, as above.  Scheduled guaifenesin .  As needed Tessalon  Perles.                         #) Hypomagnesemia: presenting serum mag level noted to be 1.6.  Has received 2 g of IV magnesium  sulfate in the ED this evening.  Plan: Monitor on tele. Repeat serum mag level in the AM.                         #) History of paraplegia: Complicated by neurogenic bladder with chronic suprapubic Foley catheter, status post replacement of suprapubic Foley catheter in the emergency department today.  On prn Valium  as an outpatient for associated muscle spasms.   Plan: Continue outpatient as needed Valium  for muscle spasms.  Fall precautions ordered.  Monitoring for results of urine culture, as above.                       #) Type 2 Diabetes Mellitus: documented history of such.  Appears to be managed via lifestyle modifications as an outpatient, in the absence of any outpatient use of insulin  or oral hypoglycemic agents.  Most recent hemoglobin A1c was found to be 5.4% when checked in June 2024.  Presenting blood sugar this evening is noted to be 100.    Plan: accuchecks QAC and HS with low dose SSI.  Add on hemoglobin A1c level.                        #(History of multiple myeloma: Document history of such, for which he is following with outpatient oncology, noting that his next outpatient oncology appointment is scheduled for  08/22/2024.  Complicated by history of anemia of chronic disease, as further detailed below.  Of note, adjusted calcium  level this evening is noted to be 9.2.  Plan: Further evaluation management per outpatient oncology.  Repeat CBC, CMP in the morning.                         #) History of recurrent DVTs: Documented history of such, including DVT noted in left upper extremity per ultrasound in January 2013.  In the context of his recurrent DVTs, and provoking features that include his history of underlying malignancy in the form of multiple myeloma, he is now chronically anticoagulated on Xarelto .  No chest pain, hemoptysis, or other presenting clinical features to increase index of suspicion of acute pulmonary embolism at this time.  Plan: Continue outpatient Xarelto .  Repeat CBC in the morning.                      #) Anemia of chronic disease: Documented history of such, in the setting of his history of multiple myeloma, a/w with baseline hgb range 9-12 ,with presenting hgb consistent with this range, in the absence of any overt evidence of active bleed.  Plan: Repeat CBC in the morning.                          #) Hyperlipidemia: documented h/o such. On atorvastatin  as outpatient.   Plan: continue home statin.             DVT prophylaxis: SCD's  + resumption of home Xarelto  Code Status: Full code Family Communication: none Disposition Plan: Per Rounding Team Consults called: none;  Admission status: Observation     I SPENT GREATER THAN 75  MINUTES IN CLINICAL CARE TIME/MEDICAL DECISION-MAKING IN COMPLETING THIS ADMISSION.      Eva NOVAK Jasyn Mey DO [facility]  From 7PM - 7AM   08/18/2024, 9:55 PM         [1]  Allergies Allergen Reactions   Ferumoxytol  Nausea And Vomiting and Other (See Comments)    The patient felt like he was going to pass out and had chills also    "

## 2024-08-18 NOTE — Progress Notes (Signed)
 Elink monitoring for the code sepsis protocol.

## 2024-08-18 NOTE — ED Triage Notes (Addendum)
 Pt BIB EMS from home after cough, loss of appetite, and fatigue that started Friday. Pt has a suprapubic catheter and colostomy in place. Pt stated he was recently discharged from the hospital for pneumonia. Pt denies SOB and chest pain at this time.   EMS Vitals BP 128/88 HR 80

## 2024-08-18 NOTE — ED Provider Notes (Signed)
 " [place] EMERGENCY DEPARTMENT AT [facility] Provider Note   CSN: [id number] Arrival date & time: 08/18/24  1755     Patient presents with: No chief complaint on file.   [name] is a 69 y.o. male with a PMHx notable for adrenal insufficiency, multiple myeloma, DVT on Xarelto , neurogenic bladder, paraplegia who presents today for evaluation of cough as well as malaise.  Patient reports that he was in his normal state of health until approximately Friday when he developed generalized malaise as well as worsening cough with sputum production.  Patient has also had generalized anorexia.  Due to progression of symptoms presenting today for further evaluation.  Patient was discharged from our facility on 12/26 for similar symptoms and found to have pneumonia and discharged with Augmentin .  Patient reports that he did finish his antibiotic therapy  HPI     Prior to Admission medications  Medication Sig Start Date End Date Taking? Authorizing Provider  acetaminophen  (TYLENOL ) 325 MG tablet Take 325-650 mg by mouth every 6 (six) hours as needed for headache or mild pain. 09/21/20   [provider]  acetic acid 0.25 % irrigation Irrigate with 1 Application as directed See admin instructions. Instill 50 ml's, clamp for 10 minutes, then remove clamp and drain the bladder. Repeat 3 times a week- when not using the Renacidin irrigation    [provider]  acyclovir  (ZOVIRAX ) 400 MG tablet Take 1 tablet (400 mg total) by mouth 2 (two) times daily. 08/18/20   Federico Norleen ONEIDA MADISON, MD  albuterol  (VENTOLIN  HFA) 108 (90 Base) MCG/ACT inhaler Inhale 2 puffs into the lungs every 4 (four) hours as needed for wheezing or shortness of breath.    [provider]  atorvastatin  (LIPITOR) 20 MG tablet Take 20 mg by mouth daily.    [provider]  BENADRYL  ALLERGY 25 MG tablet Take 25 mg by mouth every 6 (six) hours as needed (for allergic reactions).    [provider]  BIOFREEZE 4 % GEL Apply 1 application  topically every 4 (four) hours as needed (for joint pain- to intact areas of the skin only).    [provider]  Citric Ac-Gluconolact-Mg Carb (RENACIDIN IR) Irrigate with 30 mLs as directed See admin instructions. Instill 30 ml's, clamp for 10 minutes, then remove clamp and drain the bladder. Repeat 3 times a week- when not using the acetic acid irrigation    [provider]  COSOPT  PF 2-0.5 % SOLN Place 1 drop into both eyes 2 (two) times daily.    [provider]  diazepam  (VALIUM ) 5 MG tablet Take 5 mg by mouth 2 (two) times daily as needed for muscle spasms (after spinal cord injury).    [provider]  doxycycline  (VIBRA -TABS) 100 MG tablet Take 1 tablet (100 mg total) by mouth 2 (two) times daily. 07/25/24   Boscia, Heather E, NP  Emollient (EUCERIN EX) Apply 1 application  topically as needed (for dryness).    [provider]  FLOVENT HFA 110 MCG/ACT inhaler 2 each See admin instructions. 2 sprays to colostomy stoma prior to each colostomy bag change    [provider]  guaiFENesin  (ROBITUSSIN) 100 MG/5ML liquid Take 10 mLs (200 mg total) by mouth 4 (four) times daily as needed for cough. 07/18/24   Drusilla Sabas RAMAN, MD  iron  polysaccharides (NU-IRON ) 150 MG capsule Take 1 capsule (150 mg total) by mouth daily. 04/22/17   Regalado, Belkys A,  MD  latanoprost  (XALATAN ) 0.005 % ophthalmic solution Place 1 drop into both eyes at bedtime.    [provider]  Multiple Vitamin (MULTIVITAMIN WITH MINERALS) TABS tablet Take 1 tablet by mouth daily. 01/08/23   Lue Elsie BROCKS, MD  NON FORMULARY Apply 1 application  topically See admin instructions. Colo Plast paste- Apply as directed with each ostomy bag change    [provider]  ondansetron  (ZOFRAN ) 4 MG tablet Take 1 tablet (4 mg) by mouth every 6 hours as needed for nausea. 01/08/23   Lue Elsie BROCKS, MD  rivaroxaban   (XARELTO ) 10 MG TABS tablet Take 1 tablet (10 mg total) by mouth daily with supper. Patient taking differently: Take 10 mg by mouth daily. 03/26/18   Lonn Hicks, MD  Skin Protectants, Misc. (MINERIN CREME) CREA Apply to affected areas 2 times daily as needed. 01/08/23   Lue Elsie BROCKS, MD  Zinc  Oxide [facility] EX) Apply 1 application  topically See admin instructions. Apply topically daily to affected sites    [provider]    Allergies: Ferumoxytol     Review of Systems  Updated Vital Signs There were no vitals taken for this visit.  Physical Exam  (all labs ordered are listed, but only abnormal results are displayed) Labs Reviewed  RESP PANEL BY RT-PCR (RSV, FLU A&B, COVID)  RVPGX2 - Abnormal; Notable for the following components:      Result Value   SARS Coronavirus 2 by RT PCR POSITIVE (*)    All other components within normal limits  CBC WITH DIFFERENTIAL/PLATELET - Abnormal; Notable for the following components:   RBC 3.79 (*)    Hemoglobin 10.7 (*)    HCT 31.8 (*)    RDW 19.9 (*)    Lymphs Abs 0.4 (*)    Monocytes Absolute 1.5 (*)    All other components within normal limits  COMPREHENSIVE METABOLIC PANEL WITH GFR - Abnormal; Notable for the following components:   Glucose, Bld 100 (*)    Total Protein 6.4 (*)    All other components within normal limits  MAGNESIUM  - Abnormal; Notable for the following components:   Magnesium  1.6 (*)    All other components within normal limits  PROTIME-INR - Abnormal; Notable for the following components:   Prothrombin  Time 33.2 (*)    INR 3.1 (*)    All other components within normal limits  URINALYSIS, ROUTINE W REFLEX MICROSCOPIC - Abnormal; Notable for the following components:   APPearance TURBID (*)    Hgb urine dipstick MODERATE (*)    Protein, ur >=300 (*)    Nitrite POSITIVE (*)    Leukocytes,Ua MODERATE (*)    Bacteria, UA MANY (*)    All other components within normal limits  CBG MONITORING, ED -  Abnormal; Notable for the following components:   Glucose-Capillary 113 (*)    All other components within normal limits  CULTURE, BLOOD (ROUTINE X 2)  CULTURE, BLOOD (ROUTINE X 2)  URINE CULTURE  CBC WITH DIFFERENTIAL/PLATELET  COMPREHENSIVE METABOLIC PANEL WITH GFR  MAGNESIUM   PHOSPHORUS  C-REACTIVE PROTEIN  C-REACTIVE PROTEIN  PROCALCITONIN  HEMOGLOBIN A1C  I-STAT CG4 LACTIC ACID, ED  I-STAT CG4 LACTIC ACID, ED    EKG: None  Radiology: No results found.  Procedures   Medications Ordered in the ED  acetaminophen  (TYLENOL ) tablet 650 mg (has no administration in time range)    Or  acetaminophen  (TYLENOL ) suppository 650 mg (has no administration in time range)  melatonin tablet 3 mg (  has no administration in time range)  ondansetron  (ZOFRAN ) injection 4 mg (has no administration in time range)  guaiFENesin  (MUCINEX ) 12 hr tablet 600 mg (600 mg Oral Given 08/18/24 2203)  menthol  (CEPACOL) lozenge 3 mg (has no administration in time range)  benzonatate  (TESSALON ) capsule 200 mg (has no administration in time range)  diazepam  (VALIUM ) tablet 5 mg (has no administration in time range)  rivaroxaban  (XARELTO ) tablet 10 mg (10 mg Oral Given 08/18/24 2203)  lactated ringers  infusion ( Intravenous New Bag/Given 08/18/24 2238)  albuterol  (PROVENTIL ) (2.5 MG/3ML) 0.083% nebulizer solution 2.5 mg (has no administration in time range)  cefTRIAXone  (ROCEPHIN ) 1 g in sodium chloride  0.9 % 100 mL IVPB (0 g Intravenous Stopped 08/18/24 2235)  insulin  aspart (novoLOG ) injection 0-9 Units (has no administration in time range)  atorvastatin  (LIPITOR) tablet 20 mg (has no administration in time range)  Dorzolamide  HCl-Timolol  Mal PF 2-0.5 % SOLN 1 drop (has no administration in time range)  latanoprost  (XALATAN ) 0.005 % ophthalmic solution 1 drop (has no administration in time range)  lactated ringers  bolus 1,000 mL (0 mLs Intravenous Stopped 08/18/24 1945)  acetaminophen  (TYLENOL ) tablet 1,000  mg (1,000 mg Oral Given 08/18/24 1845)  ceFEPIme  (MAXIPIME ) 2 g in sodium chloride  0.9 % 100 mL IVPB (0 g Intravenous Stopped 08/18/24 1916)  vancomycin  (VANCOREADY) IVPB 1750 mg/350 mL (0 mg Intravenous Stopped 08/18/24 2120)  magnesium  sulfate IVPB 2 g 50 mL (0 g Intravenous Stopped 08/18/24 2203)                                Medical Decision Making Amount and/or Complexity of Data Reviewed Labs: ordered. Radiology: ordered.  Risk OTC drugs. Prescription drug management. Decision regarding hospitalization.   Patient is a 69 year old male who presents today for evaluation of worsening cough, shortness of breath.  On initial assessment patient was noted to be febrile to 102 but otherwise hemodynamically stable and afebrile.  On the bedside assessment patient was interviewed resting comfortable without acute distress.  Respirations even and unlabored.  No tachypnea.  Patient does have scattered rhonchi particularly around the bases.  Suprapubic catheter as well as ostomy bag in place.   Patient meets criteria for sepsis at this point in time with presumed source of central pulmonary or urinary.  Patient was provided vancomycin  and cefepime .  Patient's suprapubic Foley catheter exchanged here with 67 French to obtain a urine sample.  Review of laboratory evaluation without any acute abnormalities on CBC or metabolic panel.  Urinalysis concerning for infection.  Patient does have chronically infected looking urine however is now nitrite positive.  Patient was also positive for COVID-19.  Chest x-ray without any acute findings.  Patient remained clinically and hemodynamically stable during his time in the emergency department.  Patient did have improvement of heart rate as well as resolution of fever with the above interventions.  Will need admission at this time for further management of sepsis   Final diagnoses:  Sepsis, due to unspecified organism, unspecified whether acute organ dysfunction  present [facility])  COVID-19  Acute cystitis without hematuria    ED Discharge Orders     None          Laurita Sieving, MD 08/18/24 2246    Pamella Ozell LABOR, DO 08/18/24 2321  "

## 2024-08-19 ENCOUNTER — Other Ambulatory Visit: Payer: Self-pay

## 2024-08-19 DIAGNOSIS — N3000 Acute cystitis without hematuria: Secondary | ICD-10-CM | POA: Diagnosis present

## 2024-08-19 DIAGNOSIS — K592 Neurogenic bowel, not elsewhere classified: Secondary | ICD-10-CM | POA: Diagnosis present

## 2024-08-19 DIAGNOSIS — B952 Enterococcus as the cause of diseases classified elsewhere: Secondary | ICD-10-CM | POA: Diagnosis present

## 2024-08-19 DIAGNOSIS — U071 COVID-19: Secondary | ICD-10-CM | POA: Diagnosis present

## 2024-08-19 DIAGNOSIS — A4189 Other specified sepsis: Secondary | ICD-10-CM | POA: Diagnosis present

## 2024-08-19 DIAGNOSIS — T83511A Infection and inflammatory reaction due to indwelling urethral catheter, initial encounter: Secondary | ICD-10-CM | POA: Diagnosis present

## 2024-08-19 DIAGNOSIS — Z933 Colostomy status: Secondary | ICD-10-CM | POA: Diagnosis not present

## 2024-08-19 DIAGNOSIS — Z79899 Other long term (current) drug therapy: Secondary | ICD-10-CM | POA: Diagnosis not present

## 2024-08-19 DIAGNOSIS — N319 Neuromuscular dysfunction of bladder, unspecified: Secondary | ICD-10-CM | POA: Diagnosis present

## 2024-08-19 DIAGNOSIS — Z8041 Family history of malignant neoplasm of ovary: Secondary | ICD-10-CM | POA: Diagnosis not present

## 2024-08-19 DIAGNOSIS — E66812 Obesity, class 2: Secondary | ICD-10-CM | POA: Diagnosis present

## 2024-08-19 DIAGNOSIS — E119 Type 2 diabetes mellitus without complications: Secondary | ICD-10-CM | POA: Diagnosis present

## 2024-08-19 DIAGNOSIS — C9000 Multiple myeloma not having achieved remission: Secondary | ICD-10-CM | POA: Diagnosis present

## 2024-08-19 DIAGNOSIS — Y846 Urinary catheterization as the cause of abnormal reaction of the patient, or of later complication, without mention of misadventure at the time of the procedure: Secondary | ICD-10-CM | POA: Diagnosis present

## 2024-08-19 DIAGNOSIS — D689 Coagulation defect, unspecified: Secondary | ICD-10-CM | POA: Diagnosis present

## 2024-08-19 DIAGNOSIS — E782 Mixed hyperlipidemia: Secondary | ICD-10-CM | POA: Diagnosis not present

## 2024-08-19 DIAGNOSIS — E785 Hyperlipidemia, unspecified: Secondary | ICD-10-CM | POA: Diagnosis present

## 2024-08-19 DIAGNOSIS — G8220 Paraplegia, unspecified: Secondary | ICD-10-CM | POA: Diagnosis present

## 2024-08-19 DIAGNOSIS — D630 Anemia in neoplastic disease: Secondary | ICD-10-CM | POA: Diagnosis present

## 2024-08-19 DIAGNOSIS — Z86718 Personal history of other venous thrombosis and embolism: Secondary | ICD-10-CM | POA: Diagnosis not present

## 2024-08-19 DIAGNOSIS — G4733 Obstructive sleep apnea (adult) (pediatric): Secondary | ICD-10-CM | POA: Diagnosis present

## 2024-08-19 DIAGNOSIS — Z7901 Long term (current) use of anticoagulants: Secondary | ICD-10-CM | POA: Diagnosis not present

## 2024-08-19 DIAGNOSIS — Z833 Family history of diabetes mellitus: Secondary | ICD-10-CM | POA: Diagnosis not present

## 2024-08-19 DIAGNOSIS — D638 Anemia in other chronic diseases classified elsewhere: Secondary | ICD-10-CM | POA: Diagnosis not present

## 2024-08-19 DIAGNOSIS — B957 Other staphylococcus as the cause of diseases classified elsewhere: Secondary | ICD-10-CM | POA: Diagnosis present

## 2024-08-19 DIAGNOSIS — Z6836 Body mass index (BMI) 36.0-36.9, adult: Secondary | ICD-10-CM | POA: Diagnosis not present

## 2024-08-19 LAB — COMPREHENSIVE METABOLIC PANEL WITH GFR
ALT: 15 U/L (ref 0–44)
AST: 19 U/L (ref 15–41)
Albumin: 3.3 g/dL — ABNORMAL LOW (ref 3.5–5.0)
Alkaline Phosphatase: 76 U/L (ref 38–126)
Anion gap: 10 (ref 5–15)
BUN: 15 mg/dL (ref 8–23)
CO2: 25 mmol/L (ref 22–32)
Calcium: 8.7 mg/dL — ABNORMAL LOW (ref 8.9–10.3)
Chloride: 106 mmol/L (ref 98–111)
Creatinine, Ser: 0.88 mg/dL (ref 0.61–1.24)
GFR, Estimated: >60 mL/min
Glucose, Bld: 119 mg/dL — ABNORMAL HIGH (ref 70–99)
Potassium: 3.9 mmol/L (ref 3.5–5.1)
Sodium: 140 mmol/L (ref 135–145)
Total Bilirubin: 0.5 mg/dL (ref 0.0–1.2)
Total Protein: 5.6 g/dL — ABNORMAL LOW (ref 6.5–8.1)

## 2024-08-19 LAB — BLOOD CULTURE ID PANEL (REFLEXED) - BCID2

## 2024-08-19 LAB — CBC WITH DIFFERENTIAL/PLATELET
Abs Immature Granulocytes: 0.09 10*3/uL — ABNORMAL HIGH (ref 0.00–0.07)
Basophils Absolute: 0 10*3/uL (ref 0.0–0.1)
Basophils Relative: 1 %
Eosinophils Absolute: 0.1 10*3/uL (ref 0.0–0.5)
Eosinophils Relative: 2 %
HCT: 29.7 % — ABNORMAL LOW (ref 39.0–52.0)
Hemoglobin: 9.6 g/dL — ABNORMAL LOW (ref 13.0–17.0)
Immature Granulocytes: 1 %
Lymphocytes Relative: 10 %
Lymphs Abs: 0.6 10*3/uL — ABNORMAL LOW (ref 0.7–4.0)
MCH: 27.7 pg (ref 26.0–34.0)
MCHC: 32.3 g/dL (ref 30.0–36.0)
MCV: 85.6 fL (ref 80.0–100.0)
Monocytes Absolute: 1.7 10*3/uL — ABNORMAL HIGH (ref 0.1–1.0)
Monocytes Relative: 26 %
Neutro Abs: 3.8 10*3/uL (ref 1.7–7.7)
Neutrophils Relative %: 60 %
Platelets: 245 10*3/uL (ref 150–400)
RBC: 3.47 MIL/uL — ABNORMAL LOW (ref 4.22–5.81)
RDW: 19.9 % — ABNORMAL HIGH (ref 11.5–15.5)
WBC: 6.3 10*3/uL (ref 4.0–10.5)
nRBC: 0 % (ref 0.0–0.2)

## 2024-08-19 LAB — GLUCOSE, CAPILLARY
Glucose-Capillary: 117 mg/dL — ABNORMAL HIGH (ref 70–99)
Glucose-Capillary: 133 mg/dL — ABNORMAL HIGH (ref 70–99)

## 2024-08-19 LAB — PHOSPHORUS: Phosphorus: 3.7 mg/dL (ref 2.5–4.6)

## 2024-08-19 LAB — C-REACTIVE PROTEIN: CRP: 12.7 mg/dL — ABNORMAL HIGH

## 2024-08-19 LAB — PROCALCITONIN: Procalcitonin: 0.16 ng/mL

## 2024-08-19 LAB — HEMOGLOBIN A1C
Hgb A1c MFr Bld: 5 % (ref 4.8–5.6)
Mean Plasma Glucose: 96.8 mg/dL

## 2024-08-19 LAB — CBG MONITORING, ED
Glucose-Capillary: 77 mg/dL (ref 70–99)
Glucose-Capillary: 70 mg/dL (ref 70–99)

## 2024-08-19 LAB — MAGNESIUM: Magnesium: 2.1 mg/dL (ref 1.7–2.4)

## 2024-08-19 NOTE — Progress Notes (Signed)
 PHARMACY - PHYSICIAN COMMUNICATION CRITICAL VALUE ALERT - BLOOD CULTURE IDENTIFICATION (BCID)  [name] is an 69 y.o. male who presented to [facility] on 08/18/2024 with a chief complaint of sepsis due to COVID infection.   Assessment:   1 of 4 bottles with GPCs, BCID with staph epidermidis Most likely contaminant   Name of physician (or Provider) Contacted: Dr. Maranda  Current antibiotics: ceftriaxone    Changes to prescribed antibiotics recommended:  Recommendations accepted by provider  Results for orders placed or performed during the hospital encounter of 08/18/24  Blood Culture ID Panel (Reflexed) (Collected: 08/18/2024  6:30 PM)  Result Value Ref Range   Enterococcus faecalis NOT DETECTED NOT DETECTED   Enterococcus Faecium NOT DETECTED NOT DETECTED   Listeria monocytogenes NOT DETECTED NOT DETECTED   Staphylococcus species DETECTED (A) NOT DETECTED   Staphylococcus aureus (BCID) NOT DETECTED NOT DETECTED   Staphylococcus epidermidis DETECTED (A) NOT DETECTED   Staphylococcus lugdunensis NOT DETECTED NOT DETECTED   Streptococcus species NOT DETECTED NOT DETECTED   Streptococcus agalactiae NOT DETECTED NOT DETECTED   Streptococcus pneumoniae NOT DETECTED NOT DETECTED   Streptococcus pyogenes NOT DETECTED NOT DETECTED   A.calcoaceticus-baumannii NOT DETECTED NOT DETECTED   Bacteroides fragilis NOT DETECTED NOT DETECTED   Enterobacterales NOT DETECTED NOT DETECTED   Enterobacter cloacae complex NOT DETECTED NOT DETECTED   Escherichia coli NOT DETECTED NOT DETECTED   Klebsiella aerogenes NOT DETECTED NOT DETECTED   Klebsiella oxytoca NOT DETECTED NOT DETECTED   Klebsiella pneumoniae NOT DETECTED NOT DETECTED   Proteus species NOT DETECTED NOT DETECTED   Salmonella species NOT DETECTED NOT DETECTED   Serratia marcescens NOT DETECTED NOT DETECTED   Haemophilus influenzae NOT DETECTED NOT DETECTED   Neisseria meningitidis NOT DETECTED NOT DETECTED   Pseudomonas  aeruginosa NOT DETECTED NOT DETECTED   Stenotrophomonas maltophilia NOT DETECTED NOT DETECTED   Candida albicans NOT DETECTED NOT DETECTED   Candida auris NOT DETECTED NOT DETECTED   Candida glabrata NOT DETECTED NOT DETECTED   Candida krusei NOT DETECTED NOT DETECTED   Candida parapsilosis NOT DETECTED NOT DETECTED   Candida tropicalis NOT DETECTED NOT DETECTED   Cryptococcus neoformans/gattii NOT DETECTED NOT DETECTED   Methicillin resistance mecA/C DETECTED (A) NOT DETECTED    Thank you for allowing pharmacy to be a part of this patients care.  Shelba Collier, PharmD, BCPS Clinical Pharmacist

## 2024-08-19 NOTE — Discharge Planning (Signed)
 Transition of Care [facility]) - Inpatient Brief Assessment   Patient Details  Name: [name] MRN: [id number] Date of Birth: September 22, 1955  Transition of Care [facility]) CM/SW Contact:    Debarah Saunas, RN Phone Number: 08/19/2024, 9:19 AM   Clinical Narrative: Pt currently active with [facility] with Lonell Novak as SW [phone]).  ICM will continue to follow for discharge needs.   Transition of Care Asessment: Insurance and Status: Insurance coverage has been reviewed Patient has primary care physician: Yes (Tiffany Reed) Home environment has been reviewed: from home with spouse Prior level of function:: dependent paraplegia Prior/Current Home Services: Current home services ([facility]) Social Drivers of Health Review: SDOH reviewed no interventions necessary Readmission risk has been reviewed: No Transition of care needs: transition of care needs identified, TOC will continue to follow

## 2024-08-19 NOTE — Care Management Obs Status (Signed)
 MEDICARE OBSERVATION STATUS NOTIFICATION   Patient Details  Name: [name] MRN: [id number] Date of Birth: Jan 17, 1956   Medicare Observation Status Notification Given:  Yes    Nena LITTIE Coffee, RN 08/19/2024, 3:31 PM

## 2024-08-19 NOTE — Progress Notes (Signed)
 " PROGRESS NOTE    [name]  FMW:[id number] DOB: 21-Apr-1956 DOA: 08/18/2024 PCP: Cloria Annabella CROME, DO  Subjective: Patient with nasal congestion but denies any chest pain or shortness of breath.    Hospital Course: COVID-19 -No oxygen requirement. -At this point no need for remdesivir -Supportive care and monitoring   Assessment and Plan:  COVID-19 -No oxygen requirement. -This point no need for remdesivir -Supportive care and monitoring   Positive blood culture -1 bottle out of 4 with Staph epidermidis.  Most likely skin contaminant.  To monitor.     DVT prophylaxis: rivaroxaban  (XARELTO ) tablet 10 mg Start: 08/18/24 2145 SCDs Start: 08/18/24 2123 rivaroxaban  (XARELTO ) tablet 10 mg     Code Status: Full Code Family Communication: None Disposition Plan: Home Reason for continuing need for hospitalization: Monitoring, follow-up blood cultures.  Objective: Vitals:   08/19/24 1051 08/19/24 1245 08/19/24 1400 08/19/24 1434  BP: 130/70 124/82 (!) 102/54   Pulse: 81 73 86   Resp: 18 20 (!) 25   Temp: 98.5 F (36.9 C)   98.5 F (36.9 C)  TempSrc: Temporal     SpO2: 97% 100% 99%   Weight:      Height:        Intake/Output Summary (Last 24 hours) at 08/19/2024 1742 Last data filed at 08/19/2024 1054 Gross per 24 hour  Intake 2270.02 ml  Output 900 ml  Net 1370.02 ml   Filed Weights   08/19/24 0551  Weight: 109.8 kg    Examination:  Awake, alert, nontoxic HEENT: Nasal congestion, No scleral icterus CV:  regular rate and rhythm, no murmurs rubs gallops Lungs: diffuse rhonci Abd: soft, BS+ Ext: no c/c/e Neurologic: Grossly nonfocal  Data Reviewed: I have personally reviewed following labs and imaging studies  CBC: Recent Labs  Lab 08/18/24 1825 08/19/24 0224  WBC 7.0 6.3  NEUTROABS 4.9 3.8  HGB 10.7* 9.6*  HCT 31.8* 29.7*  MCV 83.9 85.6  PLT 275 245   Basic Metabolic Panel: Recent Labs  Lab 08/18/24 1825 08/19/24 0224  NA 139  140  K 3.8 3.9  CL 102 106  CO2 25 25  GLUCOSE 100* 119*  BUN 14 15  CREATININE 0.96 0.88  CALCIUM  8.9 8.7*  MG 1.6* 2.1  PHOS  --  3.7   GFR: Estimated Creatinine Clearance: 93.9 mL/min (by C-G formula based on SCr of 0.88 mg/dL). Liver Function Tests: Recent Labs  Lab 08/18/24 1825 08/19/24 0224  AST 23 19  ALT 15 15  ALKPHOS 86 76  BILITOT 0.6 0.5  PROT 6.4* 5.6*  ALBUMIN  3.6 3.3*   No results for input(s): LIPASE, AMYLASE in the last 168 hours. No results for input(s): AMMONIA in the last 168 hours. Coagulation Profile: Recent Labs  Lab 08/18/24 1830  INR 3.1*   Cardiac Enzymes: No results for input(s): CKTOTAL, CKMB, CKMBINDEX, TROPONINI in the last 168 hours. ProBNP, BNP (last 5 results) No results for input(s): PROBNP, BNP in the last 8760 hours. HbA1C: Recent Labs    08/19/24 0224  HGBA1C 5.0   CBG: Recent Labs  Lab 08/18/24 2222 08/19/24 0750 08/19/24 1111 08/19/24 1701  GLUCAP 113* 77 70 117*   Lipid Profile: No results for input(s): CHOL, HDL, LDLCALC, TRIG, CHOLHDL, LDLDIRECT in the last 72 hours. Thyroid Function Tests: No results for input(s): TSH, T4TOTAL, FREET4, T3FREE, THYROIDAB in the last 72 hours. Anemia Panel: No results for input(s): VITAMINB12, FOLATE, FERRITIN, TIBC, IRON , RETICCTPCT in the last 72 hours.  Sepsis Labs: Recent Labs  Lab 08/18/24 1838 08/18/24 2039 08/19/24 0224  PROCALCITON  --   --  0.16  LATICACIDVEN 1.4 0.6  --     Recent Results (from the past 240 hours)  Resp panel by RT-PCR (RSV, Flu A&B, Covid) Anterior Nasal Swab     Status: Abnormal   Collection Time: 08/18/24  6:25 PM   Specimen: Anterior Nasal Swab  Result Value Ref Range Status   SARS Coronavirus 2 by RT PCR POSITIVE (A) NEGATIVE Final   Influenza A by PCR NEGATIVE NEGATIVE Final   Influenza B by PCR NEGATIVE NEGATIVE Final    Comment: (NOTE) The Xpert Xpress SARS-CoV-2/FLU/RSV plus assay  is intended as an aid in the diagnosis of influenza from Nasopharyngeal swab specimens and should not be used as a sole basis for treatment. Nasal washings and aspirates are unacceptable for Xpert Xpress SARS-CoV-2/FLU/RSV testing.  Fact Sheet for Patients: bloggercourse.com  Fact Sheet for Healthcare Providers: seriousbroker.it  This test is not yet approved or cleared by the [place]  FDA and has been authorized for detection and/or diagnosis of SARS-CoV-2 by FDA under an Emergency Use Authorization (EUA). This EUA will remain in effect (meaning this test can be used) for the duration of the COVID-19 declaration under Section 564(b)(1) of the Act, 21 U.S.C. section 360bbb-3(b)(1), unless the authorization is terminated or revoked.     Resp Syncytial Virus by PCR NEGATIVE NEGATIVE Final    Comment: (NOTE) Fact Sheet for Patients: bloggercourse.com  Fact Sheet for Healthcare Providers: seriousbroker.it  This test is not yet approved or cleared by the [place]  FDA and has been authorized for detection and/or diagnosis of SARS-CoV-2 by FDA under an Emergency Use Authorization (EUA). This EUA will remain in effect (meaning this test can be used) for the duration of the COVID-19 declaration under Section 564(b)(1) of the Act, 21 U.S.C. section 360bbb-3(b)(1), unless the authorization is terminated or revoked.  Performed at [facility], [address] [place], [place] [address]   Blood Culture (routine x 2)     Status: None (Preliminary result)   Collection Time: 08/18/24  6:30 PM   Specimen: BLOOD RIGHT ARM  Result Value Ref Range Status   Specimen Description BLOOD RIGHT ARM  Final   Special Requests   Final    BOTTLES DRAWN AEROBIC AND ANAEROBIC Blood Culture adequate volume   Culture  Setup Time   Final    GRAM POSITIVE COCCI AEROBIC BOTTLE ONLY Organism  ID to follow Performed at [facility], [address], [place], [place] [address]    Culture GRAM POSITIVE COCCI  Final   Report Status PENDING  Incomplete  Blood Culture (routine x 2)     Status: None (Preliminary result)   Collection Time: 08/18/24  6:54 PM   Specimen: BLOOD  Result Value Ref Range Status   Specimen Description BLOOD RIGHT ANTECUBITAL  Final   Special Requests   Final    BOTTLES DRAWN AEROBIC AND ANAEROBIC Blood Culture results may not be optimal due to an inadequate volume of blood received in culture bottles   Culture   Final    NO GROWTH < 12 HOURS Performed at [facility], [address], [place], [place] [address]    Report Status PENDING  Incomplete  Urine Culture     Status: None (Preliminary result)   Collection Time: 08/18/24  9:04 PM   Specimen: Urine, Clean Catch  Result Value Ref Range  Status   Specimen Description URINE, CLEAN CATCH  Final   Special Requests NONE  Final   Culture   Final    CULTURE REINCUBATED FOR BETTER GROWTH Performed at [facility], [address], [place], [place] [address]    Report Status PENDING  Incomplete     Radiology Studies: DG Chest Portable 1 View Result Date: 08/18/2024 EXAM: 1 VIEW(S) XRAY OF THE CHEST 08/18/2024 06:17:33 PM COMPARISON: 07/14/2024 CLINICAL HISTORY: Shortness of breath. FINDINGS: LUNGS AND PLEURA: No focal pulmonary opacity. No pleural effusion. No pneumothorax. HEART AND MEDIASTINUM: The cardiac shadow is stable. BONES AND SOFT TISSUES: Postsurgical changes are noted in the mid thoracic spine as well as the left chest wall. No acute osseous abnormality is noted. IMPRESSION: 1. No acute process. Electronically signed by: Oneil Devonshire MD 08/18/2024 06:20 PM EST RP Workstation: [id number]    Scheduled Meds:  atorvastatin   20 mg Oral Daily   dorzolamide -timolol   1 drop Both Eyes BID   guaiFENesin   600 mg Oral BID   insulin  aspart  0-9 Units Subcutaneous TID WC   latanoprost   1  drop Both Eyes QHS   rivaroxaban   10 mg Oral Q supper   Continuous Infusions:  cefTRIAXone  (ROCEPHIN )  IV Stopped (08/18/24 2235)     LOS: 0 days   Time spent: 30 minutes  Lonni KANDICE Moose, MD  [facility]  08/19/2024, 5:42 PM   "

## 2024-08-19 NOTE — Plan of Care (Signed)
" °  Problem: Education: Goal: Knowledge of risk factors and measures for prevention of condition will improve Outcome: Progressing   Problem: Coping: Goal: Psychosocial and spiritual needs will be supported Outcome: Progressing   Problem: Respiratory: Goal: Will maintain a patent airway Outcome: Progressing Goal: Complications related to the disease process, condition or treatment will be avoided or minimized Outcome: Progressing   Problem: Education: Goal: Ability to describe self-care measures that may prevent or decrease complications (Diabetes Survival Skills Education) will improve Outcome: Progressing Goal: Individualized Educational Video(s) Outcome: Progressing   Problem: Coping: Goal: Ability to adjust to condition or change in health will improve Outcome: Progressing   Problem: Fluid Volume: Goal: Ability to maintain a balanced intake and output will improve Outcome: Progressing   Problem: Health Behavior/Discharge Planning: Goal: Ability to identify and utilize available resources and services will improve Outcome: Progressing Goal: Ability to manage health-related needs will improve Outcome: Progressing   Problem: Metabolic: Goal: Ability to maintain appropriate glucose levels will improve Outcome: Progressing   Problem: Nutritional: Goal: Maintenance of adequate nutrition will improve Outcome: Progressing Goal: Progress toward achieving an optimal weight will improve Outcome: Progressing   Problem: Skin Integrity: Goal: Risk for impaired skin integrity will decrease Outcome: Progressing   Problem: Tissue Perfusion: Goal: Adequacy of tissue perfusion will improve Outcome: Progressing   Problem: Education: Goal: Knowledge of General Education information will improve Description: Including pain rating scale, medication(s)/side effects and non-pharmacologic comfort measures Outcome: Progressing   Problem: Health Behavior/Discharge Planning: Goal:  Ability to manage health-related needs will improve Outcome: Progressing   Problem: Clinical Measurements: Goal: Ability to maintain clinical measurements within normal limits will improve Outcome: Progressing Goal: Will remain free from infection Outcome: Progressing Goal: Diagnostic test results will improve Outcome: Progressing Goal: Respiratory complications will improve Outcome: Progressing Goal: Cardiovascular complication will be avoided Outcome: Progressing   Problem: Activity: Goal: Risk for activity intolerance will decrease Outcome: Progressing   Problem: Coping: Goal: Level of anxiety will decrease Outcome: Progressing   Problem: Elimination: Goal: Will not experience complications related to bowel motility Outcome: Progressing Goal: Will not experience complications related to urinary retention Outcome: Progressing   Problem: Pain Managment: Goal: General experience of comfort will improve and/or be controlled Outcome: Progressing   Problem: Safety: Goal: Ability to remain free from injury will improve Outcome: Progressing   Problem: Skin Integrity: Goal: Risk for impaired skin integrity will decrease Outcome: Progressing   "

## 2024-08-20 LAB — GLUCOSE, CAPILLARY
Glucose-Capillary: 90 mg/dL (ref 70–99)
Glucose-Capillary: 84 mg/dL (ref 70–99)
Glucose-Capillary: 81 mg/dL (ref 70–99)
Glucose-Capillary: 124 mg/dL — ABNORMAL HIGH (ref 70–99)

## 2024-08-20 LAB — CULTURE, BLOOD (ROUTINE X 2): Special Requests: ADEQUATE

## 2024-08-20 MED ORDER — AMOXICILLIN 500 MG PO CAPS
500.0000 mg | ORAL_CAPSULE | Freq: Three times a day (TID) | ORAL | Status: DC
  Administered 2024-08-20 – 2024-08-21 (×3): 500 mg via ORAL
  Filled 2024-08-20 (×3): qty 1

## 2024-08-20 NOTE — Progress Notes (Addendum)
 "                        PROGRESS NOTE        PATIENT DETAILS Name: [name] Age: 69 y.o. Sex: male Date of Birth: 11-13-1955 Admit Date: 08/18/2024 Admitting Physician Eva KATHEE Pore, DO ERE:Mzzi, Tiffany L, DO  Brief Summary: Patient is a 69 y.o.  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  Significant events: 1/26>> admit to [facility]  Significant studies: 1/26>> chest x-ray: No PNA  Significant microbiology data: 1/26>> COVID PCR: +ve 1/26>> influenza/RSV PCR: Negative 1/26>> blood culture: Gram-positive cocci 1/26>> urine culture: Enterococcus faecalis  Procedures: None  Consults: None  Subjective: Feels much better-afebrile.  No chills.  Continues to cough.  Objective: Vitals: Blood pressure 115/66, pulse 77, temperature 99.1 F (37.3 C), temperature source Oral, resp. rate (!) 24, height 5' 8 (1.727 m), weight 107.7 kg, SpO2 93%.   Exam: Gen Exam:Alert awake-not in any distress HEENT:atraumatic, normocephalic Chest: B/L clear to auscultation anteriorly CVS:S1S2 regular Abdomen:soft non tender, non distended Extremities:no edema Neurology: Paraplegic at baseline. Skin: no rash  Pertinent Labs/Radiology:    Latest Ref Rng & Units 08/19/2024    2:24 AM 08/18/2024    6:25 PM 08/08/2024   11:16 AM  CBC  WBC 4.0 - 10.5 K/uL 6.3  7.0  7.9   Hemoglobin 13.0 - 17.0 g/dL 9.6  89.2  89.8   Hematocrit 39.0 - 52.0 % 29.7  31.8  29.8   Platelets 150 - 400 K/uL 245  275  134     Lab Results  Component Value Date   NA 140 08/19/2024   K 3.9 08/19/2024   CL 106 08/19/2024   CO2 25 08/19/2024      Assessment/Plan: Sepsis secondary to COVID-19 infection/complicated UTI Sepsis physiology has resolved See below.  COVID-19 infection Some cough but  otherwise asymptomatic On room air-chest x-ray without PNA Supportive care  Coag negative bacteremia Likely Staph epidermidis-contaminant-likely does not need treatment  Enterococcus UTI Febrile when he first came-has suprapubic catheter-history of multiple myeloma Start amoxicillin -x 5 days-stop Rocephin . Follow final culture results  DM-2 (A1c 5.0 on 1/27) CBG stable on SSI  Recent Labs    08/19/24 1701 08/19/24 2118 08/20/24 0804  GLUCAP 117* 133* 90    HLD Lipitor  History of multiple myeloma Follows with Dr. Federico  Normocytic anemia No overt bleeding Chronic issue-related to underlying chronic disease/malignancy Follow CBC periodically  History of recurrent VTE Xarelto   Chronic paraplegia/neurogenic bladder/suprapubic catheter Supportive care  Class 2 Obesity: Estimated body mass index is 36.1 kg/m as calculated from the following:   Height as of this encounter: 5' 8 (1.727 m).   Weight as of this encounter: 107.7 kg.   Code status:   Code Status: Full Code   DVT Prophylaxis: rivaroxaban  (XARELTO ) tablet 10 mg Start: 08/18/24 2145 SCDs Start: 08/18/24 2123 rivaroxaban  (XARELTO ) tablet 10 mg     Family Communication: Spouse-Jacqueline Edgett-[phone] called x 2-no response   Disposition Plan: Status is: Inpatient Remains inpatient appropriate because: Severity of illness   Planned Discharge Destination:Home health   Diet: Diet Order             Diet regular Room service appropriate? Yes; Fluid consistency: Thin  Diet effective now  Antimicrobial agents: Anti-infectives (From admission, onward)    Start     Dose/Rate Route Frequency Ordered Stop   08/18/24 2200  cefTRIAXone  (ROCEPHIN ) 1 g in sodium chloride  0.9 % 100 mL IVPB        1 g 200 mL/hr over 30 Minutes Intravenous Every 24 hours 08/18/24 2148     08/18/24 1845  vancomycin  (VANCOREADY) IVPB 1750 mg/350 mL        1,750 mg 175 mL/hr over 120  Minutes Intravenous  Once 08/18/24 1831 08/18/24 2120   08/18/24 1830  vancomycin  (VANCOCIN ) IVPB 1000 mg/200 mL premix  Status:  Discontinued        1,000 mg 200 mL/hr over 60 Minutes Intravenous  Once 08/18/24 1827 08/18/24 1831   08/18/24 1830  ceFEPIme  (MAXIPIME ) 2 g in sodium chloride  0.9 % 100 mL IVPB        2 g 200 mL/hr over 30 Minutes Intravenous  Once 08/18/24 1827 08/18/24 1916        MEDICATIONS: Scheduled Meds:  atorvastatin   20 mg Oral Daily   dorzolamide -timolol   1 drop Both Eyes BID   guaiFENesin   600 mg Oral BID   insulin  aspart  0-9 Units Subcutaneous TID WC   latanoprost   1 drop Both Eyes QHS   rivaroxaban   10 mg Oral Q supper   Continuous Infusions:  cefTRIAXone  (ROCEPHIN )  IV 1 g (08/19/24 2127)   PRN Meds:.acetaminophen  **OR** acetaminophen , albuterol , benzonatate , diazepam , melatonin, menthol , ondansetron  (ZOFRAN ) IV   I have personally reviewed following labs and imaging studies  LABORATORY DATA: CBC: Recent Labs  Lab 08/18/24 1825 08/19/24 0224  WBC 7.0 6.3  NEUTROABS 4.9 3.8  HGB 10.7* 9.6*  HCT 31.8* 29.7*  MCV 83.9 85.6  PLT 275 245    Basic Metabolic Panel: Recent Labs  Lab 08/18/24 1825 08/19/24 0224  NA 139 140  K 3.8 3.9  CL 102 106  CO2 25 25  GLUCOSE 100* 119*  BUN 14 15  CREATININE 0.96 0.88  CALCIUM  8.9 8.7*  MG 1.6* 2.1  PHOS  --  3.7    GFR: Estimated Creatinine Clearance: 92.9 mL/min (by C-G formula based on SCr of 0.88 mg/dL).  Liver Function Tests: Recent Labs  Lab 08/18/24 1825 08/19/24 0224  AST 23 19  ALT 15 15  ALKPHOS 86 76  BILITOT 0.6 0.5  PROT 6.4* 5.6*  ALBUMIN  3.6 3.3*   No results for input(s): LIPASE, AMYLASE in the last 168 hours. No results for input(s): AMMONIA in the last 168 hours.  Coagulation Profile: Recent Labs  Lab 08/18/24 1830  INR 3.1*    Cardiac Enzymes: No results for input(s): CKTOTAL, CKMB, CKMBINDEX, TROPONINI in the last 168 hours.  BNP (last 3  results) No results for input(s): PROBNP in the last 8760 hours.  Lipid Profile: No results for input(s): CHOL, HDL, LDLCALC, TRIG, CHOLHDL, LDLDIRECT in the last 72 hours.  Thyroid Function Tests: No results for input(s): TSH, T4TOTAL, FREET4, T3FREE, THYROIDAB in the last 72 hours.  Anemia Panel: No results for input(s): VITAMINB12, FOLATE, FERRITIN, TIBC, IRON , RETICCTPCT in the last 72 hours.  Urine analysis:    Component Value Date/Time   COLORURINE YELLOW 08/18/2024 1917   APPEARANCEUR TURBID (A) 08/18/2024 1917   LABSPEC 1.018 08/18/2024 1917   PHURINE 5.0 08/18/2024 1917   GLUCOSEU NEGATIVE 08/18/2024 1917   HGBUR MODERATE (A) 08/18/2024 1917   HGBUR small 07/08/2010 0831   BILIRUBINUR NEGATIVE 08/18/2024 1917   KETONESUR NEGATIVE 08/18/2024 1917  PROTEINUR >=300 (A) 08/18/2024 1917   UROBILINOGEN 0.2 07/21/2014 1123   NITRITE POSITIVE (A) 08/18/2024 1917   LEUKOCYTESUR MODERATE (A) 08/18/2024 1917    Sepsis Labs: Lactic Acid, Venous    Component Value Date/Time   LATICACIDVEN 0.6 08/18/2024 2039    MICROBIOLOGY: Recent Results (from the past 240 hours)  Resp panel by RT-PCR (RSV, Flu A&B, Covid) Anterior Nasal Swab     Status: Abnormal   Collection Time: 08/18/24  6:25 PM   Specimen: Anterior Nasal Swab  Result Value Ref Range Status   SARS Coronavirus 2 by RT PCR POSITIVE (A) NEGATIVE Final   Influenza A by PCR NEGATIVE NEGATIVE Final   Influenza B by PCR NEGATIVE NEGATIVE Final    Comment: (NOTE) The Xpert Xpress SARS-CoV-2/FLU/RSV plus assay is intended as an aid in the diagnosis of influenza from Nasopharyngeal swab specimens and should not be used as a sole basis for treatment. Nasal washings and aspirates are unacceptable for Xpert Xpress SARS-CoV-2/FLU/RSV testing.  Fact Sheet for Patients: bloggercourse.com  Fact Sheet for Healthcare  Providers: seriousbroker.it  This test is not yet approved or cleared by the [place]  FDA and has been authorized for detection and/or diagnosis of SARS-CoV-2 by FDA under an Emergency Use Authorization (EUA). This EUA will remain in effect (meaning this test can be used) for the duration of the COVID-19 declaration under Section 564(b)(1) of the Act, 21 U.S.C. section 360bbb-3(b)(1), unless the authorization is terminated or revoked.     Resp Syncytial Virus by PCR NEGATIVE NEGATIVE Final    Comment: (NOTE) Fact Sheet for Patients: bloggercourse.com  Fact Sheet for Healthcare Providers: seriousbroker.it  This test is not yet approved or cleared by the [place]  FDA and has been authorized for detection and/or diagnosis of SARS-CoV-2 by FDA under an Emergency Use Authorization (EUA). This EUA will remain in effect (meaning this test can be used) for the duration of the COVID-19 declaration under Section 564(b)(1) of the Act, 21 U.S.C. section 360bbb-3(b)(1), unless the authorization is terminated or revoked.  Performed at [facility], [address], [place], [place] [address]   Blood Culture (routine x 2)     Status: None (Preliminary result)   Collection Time: 08/18/24  6:30 PM   Specimen: BLOOD RIGHT ARM  Result Value Ref Range Status   Specimen Description BLOOD RIGHT ARM  Final   Special Requests   Final    BOTTLES DRAWN AEROBIC AND ANAEROBIC Blood Culture adequate volume   Culture  Setup Time   Final    GRAM POSITIVE COCCI AEROBIC BOTTLE ONLY CRITICAL RESULT CALLED TO, READ BACK BY AND VERIFIED WITH: PHARMD A.WELBORN [id number] @ 1745 FH Performed at [facility], [address], [place], [place] [address]    Culture GRAM POSITIVE COCCI  Final   Report Status PENDING  Incomplete  Blood Culture ID Panel (Reflexed)     Status: Abnormal   Collection Time: 08/18/24  6:30 PM   Result Value Ref Range Status   Enterococcus faecalis NOT DETECTED NOT DETECTED Final   Enterococcus Faecium NOT DETECTED NOT DETECTED Final   Listeria monocytogenes NOT DETECTED NOT DETECTED Final   Staphylococcus species DETECTED (A) NOT DETECTED Final    Comment: CRITICAL RESULT CALLED TO, READ BACK BY AND VERIFIED WITH: PHARMD A.WELBORN [id number] @ 1745 FH    Staphylococcus aureus (BCID) NOT DETECTED NOT DETECTED Final   Staphylococcus epidermidis DETECTED (A) NOT DETECTED Final    Comment: Methicillin (oxacillin) resistant  coagulase negative staphylococcus. Possible blood culture contaminant (unless isolated from more than one blood culture draw or clinical case suggests pathogenicity). No antibiotic treatment is indicated for blood  culture contaminants. CRITICAL RESULT CALLED TO, READ BACK BY AND VERIFIED WITH: PHARMD A.WELBORN [id number] @ 1745 FH    Staphylococcus lugdunensis NOT DETECTED NOT DETECTED Final   Streptococcus species NOT DETECTED NOT DETECTED Final   Streptococcus agalactiae NOT DETECTED NOT DETECTED Final   Streptococcus pneumoniae NOT DETECTED NOT DETECTED Final   Streptococcus pyogenes NOT DETECTED NOT DETECTED Final   A.calcoaceticus-baumannii NOT DETECTED NOT DETECTED Final   Bacteroides fragilis NOT DETECTED NOT DETECTED Final   Enterobacterales NOT DETECTED NOT DETECTED Final   Enterobacter cloacae complex NOT DETECTED NOT DETECTED Final   Escherichia coli NOT DETECTED NOT DETECTED Final   Klebsiella aerogenes NOT DETECTED NOT DETECTED Final   Klebsiella oxytoca NOT DETECTED NOT DETECTED Final   Klebsiella pneumoniae NOT DETECTED NOT DETECTED Final   Proteus species NOT DETECTED NOT DETECTED Final   Salmonella species NOT DETECTED NOT DETECTED Final   Serratia marcescens NOT DETECTED NOT DETECTED Final   Haemophilus influenzae NOT DETECTED NOT DETECTED Final   Neisseria meningitidis NOT DETECTED NOT DETECTED Final   Pseudomonas aeruginosa NOT DETECTED NOT  DETECTED Final   Stenotrophomonas maltophilia NOT DETECTED NOT DETECTED Final   Candida albicans NOT DETECTED NOT DETECTED Final   Candida auris NOT DETECTED NOT DETECTED Final   Candida glabrata NOT DETECTED NOT DETECTED Final   Candida krusei NOT DETECTED NOT DETECTED Final   Candida parapsilosis NOT DETECTED NOT DETECTED Final   Candida tropicalis NOT DETECTED NOT DETECTED Final   Cryptococcus neoformans/gattii NOT DETECTED NOT DETECTED Final   Methicillin resistance mecA/C DETECTED (A) NOT DETECTED Final    Comment: CRITICAL RESULT CALLED TO, READ BACK BY AND VERIFIED WITH: PHARMD A.WELBORN [id number] @ 1745 FH Performed at [facility], [address] [place], [place] [address]   Blood Culture (routine x 2)     Status: None (Preliminary result)   Collection Time: 08/18/24  6:54 PM   Specimen: BLOOD  Result Value Ref Range Status   Specimen Description BLOOD RIGHT ANTECUBITAL  Final   Special Requests   Final    BOTTLES DRAWN AEROBIC AND ANAEROBIC Blood Culture results may not be optimal due to an inadequate volume of blood received in culture bottles   Culture   Final    NO GROWTH 2 DAYS Performed at [facility], [address] [place], [place] [address]    Report Status PENDING  Incomplete  Urine Culture     Status: Abnormal (Preliminary result)   Collection Time: 08/18/24  9:04 PM   Specimen: Urine, Clean Catch  Result Value Ref Range Status   Specimen Description URINE, CLEAN CATCH  Final   Special Requests NONE  Final   Culture (A)  Final    70,000 COLONIES/mL ENTEROCOCCUS FAECALIS SUSCEPTIBILITIES TO FOLLOW Performed at [facility], [address], [place], [place] [address]    Report Status PENDING  Incomplete    RADIOLOGY STUDIES/RESULTS: DG Chest Portable 1 View Result Date: 08/18/2024 EXAM: 1 VIEW(S) XRAY OF THE CHEST 08/18/2024 06:17:33 PM COMPARISON: 07/14/2024 CLINICAL HISTORY: Shortness of breath. FINDINGS: LUNGS AND PLEURA: No focal  pulmonary opacity. No pleural effusion. No pneumothorax. HEART AND MEDIASTINUM: The cardiac shadow is stable. BONES AND SOFT TISSUES: Postsurgical changes are noted in the mid thoracic spine as well as the left chest wall. No  acute osseous abnormality is noted. IMPRESSION: 1. No acute process. Electronically signed by: Oneil Devonshire MD 08/18/2024 06:20 PM EST RP Workstation: [id number]     LOS: 1 day   Donalda Applebaum, MD  [facility]    To contact the attending provider between 7A-7P or the covering provider during after hours 7P-7A, please log into the web site [email or web address] and access using universal [place] password for that web site. If you do not have the password, please call the hospital operator.  08/20/2024, 10:00 AM    "

## 2024-08-20 NOTE — Plan of Care (Signed)
  Problem: Education: Goal: Knowledge of risk factors and measures for prevention of condition will improve Outcome: Progressing   Problem: Coping: Goal: Psychosocial and spiritual needs will be supported Outcome: Progressing   Problem: Respiratory: Goal: Will maintain a patent airway Outcome: Progressing   Problem: Education: Goal: Ability to describe self-care measures that may prevent or decrease complications (Diabetes Survival Skills Education) will improve Outcome: Progressing

## 2024-08-20 NOTE — TOC Progression Note (Signed)
 Transition of Care [facility]) - Progression Note    Patient Details  Name: [name] MRN: [id number] Date of Birth: 1956-02-15  Transition of Care [facility]) CM/SW Contact  Inocente GORMAN [place], [place] Phone Number: 08/20/2024, 1:01 PM  Clinical Narrative:    CSW received call from [facility] [phone]) who stated they can pick patient up to transport home when discharged as long as they know before 5pm.                     Expected Discharge Plan and Services                                               Social Drivers of Health (SDOH) Interventions SDOH Screenings   Food Insecurity: No Food Insecurity (08/19/2024)  Housing: Low Risk (08/19/2024)  Transportation Needs: No Transportation Needs (08/19/2024)  Utilities: Not At Risk (08/19/2024)  Depression (PHQ2-9): Low Risk (08/08/2024)  Social Connections: Moderately Integrated (08/19/2024)  Tobacco Use: Low Risk (08/18/2024)    Readmission Risk Interventions    12/26/2022    3:45 PM 12/26/2022    3:36 PM 12/26/2022    3:25 PM  Readmission Risk Prevention Plan  Post Dischage Appt Complete  Complete  Medication Screening Complete  Complete  Transportation Screening Complete -- Complete

## 2024-08-21 ENCOUNTER — Other Ambulatory Visit (HOSPITAL_COMMUNITY): Payer: Self-pay

## 2024-08-21 ENCOUNTER — Encounter: Payer: Self-pay | Admitting: Hematology and Oncology

## 2024-08-21 LAB — URINE CULTURE: Culture: 70000 — AB

## 2024-08-21 LAB — GLUCOSE, CAPILLARY: Glucose-Capillary: 96 mg/dL (ref 70–99)

## 2024-08-21 MED ORDER — AMOXICILLIN 500 MG PO CAPS
500.0000 mg | ORAL_CAPSULE | Freq: Three times a day (TID) | ORAL | 0 refills | Status: AC
  Filled 2024-08-21: qty 15, 5d supply, fill #0

## 2024-08-21 MED ORDER — ONDANSETRON 4 MG PO TBDP
4.0000 mg | ORAL_TABLET | Freq: Three times a day (TID) | ORAL | 0 refills | Status: AC | PRN
  Filled 2024-08-21: qty 20, 7d supply, fill #0

## 2024-08-21 MED ORDER — BENZONATATE 200 MG PO CAPS
200.0000 mg | ORAL_CAPSULE | Freq: Three times a day (TID) | ORAL | 0 refills | Status: AC | PRN
  Filled 2024-08-21: qty 20, 7d supply, fill #0

## 2024-08-21 MED FILL — Dexamethasone Sodium Phosphate Inj 100 MG/10ML: INTRAMUSCULAR | Qty: 4 | Status: AC

## 2024-08-21 NOTE — Discharge Summary (Signed)
 "  PATIENT DETAILS Name: [name] Age: 69 y.o. Sex: male Date of Birth: [birth year] MRN: [id number]. Admitting Physician: Eva KATHEE Pore, DO ERE:Mzzi, Annabella CROME, DO  Admit Date: 08/18/2024 Discharge date: 08/21/2024  Recommendations for Outpatient Follow-up:  Follow up with PCP in 1-2 weeks Please obtain CMP/CBC in one week  Admitted From:  Home  Disposition: Home   Discharge Condition: good  CODE STATUS:   Code Status: Full Code   Diet recommendation:  Diet Order             Diet Carb Modified           Diet - low sodium heart healthy           Diet regular Room service appropriate? Yes; Fluid consistency: Thin  Diet effective now                    Brief Summary: Patient is a 69 y.o.  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   Significant events: 1/26>> admit to [facility]   Significant studies: 1/26>> chest x-ray: No PNA   Significant microbiology data: 1/26>> COVID PCR: +ve 1/26>> influenza/RSV PCR: Negative 1/26>> blood culture: Staph epidermidis (contaminant) 1/26>> urine culture: Enterococcus faecalis   Procedures: None   Consults: None   Brief Hospital Course: Sepsis secondary to COVID-19 infection/complicated UTI Sepsis physiology has resolved See below.   COVID-19 infection Some cough but otherwise asymptomatic On room air-chest x-ray without PNA Supportive care   Coag negative bacteremia Likely Staph epidermidis-contaminant-likely does not need treatment   Enterococcus UTI Febrile when he first came-has suprapubic catheter-history of multiple myeloma Afebrile for the past several days-feels clinically improved-no nausea or vomiting. Continue amoxicillin  for a few more days on discharge.   DM-2 (A1c 5.0 on 1/27) CBG  stable on SSI Seems diet controlled in the outpatient setting.  HLD Lipitor   History of multiple myeloma Follows with Dr. Federico   Normocytic anemia No overt bleeding Chronic issue-related to underlying chronic disease/malignancy Follow CBC periodically   History of recurrent VTE Xarelto    Chronic paraplegia/neurogenic bladder/suprapubic catheter Supportive care   Class 2 Obesity: Estimated body mass index is 36.1 kg/m as calculated from the following:   Height as of this encounter: 5' 8 (1.727 m).   Weight as of this encounter: 107.7 kg.    Discharge Diagnoses:  Principal Problem:   COVID-19 virus infection Active Problems:   Paraplegia (HCC)   Multiple myeloma (HCC)   Anemia of chronic disease   History of DVT (deep vein thrombosis)   DM2 (diabetes mellitus, type 2) (HCC)   Fever   Sepsis (HCC)   Hypomagnesemia   Pyuria   HLD (hyperlipidemia)   Discharge Instructions:  Activity:  As tolerated with Full fall precautions use walker/cane & assistance as needed   Discharge Instructions     Call MD for:  difficulty breathing, headache or visual disturbances   Complete by: As directed    Call MD for:  persistant dizziness or light-headedness   Complete by: As directed    Diet - low sodium heart healthy   Complete by: As directed    Diet Carb Modified   Complete by: As directed    Discharge instructions   Complete by: As directed    Follow with Primary MD  Cloria, Tiffany L, DO in 1-2  weeks  Please get a complete blood count and chemistry panel checked by your Primary MD at your next visit, and again as instructed by your Primary MD.  Get Medicines reviewed and adjusted: Please take all your medications with you for your next visit with your Primary MD  Laboratory/radiological data: Please request your Primary MD to go over all hospital tests and procedure/radiological results at the follow up, please ask your Primary MD to get all Hospital records sent  to his/her office.  In some cases, they will be blood work, cultures and biopsy results pending at the time of your discharge. Please request that your primary care M.D. follows up on these results.  Also Note the following: If you experience worsening of your admission symptoms, develop shortness of breath, life threatening emergency, suicidal or homicidal thoughts you must seek medical attention immediately by calling 911 or calling your MD immediately  if symptoms less severe.  You must read complete instructions/literature along with all the possible adverse reactions/side effects for all the Medicines you take and that have been prescribed to you. Take any new Medicines after you have completely understood and accpet all the possible adverse reactions/side effects.   Do not drive when taking Pain medications or sleeping medications (Benzodaizepines)  Do not take more than prescribed Pain, Sleep and Anxiety Medications. It is not advisable to combine anxiety,sleep and pain medications without talking with your primary care practitioner  Special Instructions: If you have smoked or chewed Tobacco  in the last 2 yrs please stop smoking, stop any regular Alcohol  and or any Recreational drug use.  Wear Seat belts while driving.  Please note: You were cared for by a hospitalist during your hospital stay. Once you are discharged, your primary care physician will handle any further medical issues. Please note that NO REFILLS for any discharge medications will be authorized once you are discharged, as it is imperative that you return to your primary care physician (or establish a relationship with a primary care physician if you do not have one) for your post hospital discharge needs so that they can reassess your need for medications and monitor your lab values.   Increase activity slowly   Complete by: As directed       Allergies as of 08/21/2024       Reactions   Ferumoxytol  Nausea And  Vomiting, Other (See Comments)   The patient felt like he was going to [place] out and had chills also        Medication List     TAKE these medications    acetaminophen  325 MG tablet Commonly known as: TYLENOL  Take 325-650 mg by mouth every 6 (six) hours as needed for headache or mild pain.   acetic acid 0.25 % irrigation Irrigate with 1 Application as directed See admin instructions. Instill 50 ml's, clamp for 10 minutes, then remove clamp and drain the bladder. Repeat 3 times a week- when not using the Renacidin irrigation   acyclovir  400 MG tablet Commonly known as: ZOVIRAX  Take 1 tablet (400 mg total) by mouth 2 (two) times daily.   albuterol  108 (90 Base) MCG/ACT inhaler Commonly known as: VENTOLIN  HFA Inhale 2 puffs into the lungs every 4 (four) hours as needed for wheezing or shortness of breath.   amoxicillin  500 MG capsule Commonly known as: AMOXIL  Take 1 capsule (500 mg total) by mouth every 8 (eight) hours for 5 days.   atorvastatin  20 MG tablet Commonly known as: LIPITOR Take  20 mg by mouth daily after supper.   BALMEX EX Apply 1 application  topically See admin instructions. Apply topically daily to affected sites   benzonatate  200 MG capsule Commonly known as: TESSALON  Take 1 capsule (200 mg total) by mouth 3 (three) times daily as needed for cough.   betamethasone dipropionate 0.05 % cream Apply 1 Application topically 2 (two) times daily.   Biofreeze 4 % Gel Generic drug: Menthol  (Topical Analgesic) Apply 1 application  topically every 4 (four) hours as needed (for joint pain- to intact areas of the skin only).   Cholecalciferol  25 MCG (1000 UT) tablet Take 1,000 Units by mouth in the morning.   dorzolamide -timolol  2-0.5 % ophthalmic solution Commonly known as: COSOPT  Place 1 drop into both eyes 2 (two) times daily.   EUCERIN EX Apply 1 application  topically as needed (for dryness).   fluticasone 110 MCG/ACT inhaler Commonly known as: FLOVENT  HFA 2 puffs by Other route 2 (two) times daily.   FT Tussin Adult 200 MG/10ML liquid Generic drug: guaiFENesin  Take 10 mLs (200 mg total) by mouth 4 (four) times daily as needed for cough.   latanoprost  0.005 % ophthalmic solution Commonly known as: XALATAN  Place 1 drop into both eyes at bedtime.   NON FORMULARY Apply 1 application  topically See admin instructions. COLO PLAST PASTE- Apply as directed with each ostomy bag change   ondansetron  4 MG disintegrating tablet Commonly known as: ZOFRAN -ODT Take 1 tablet (4 mg total) by mouth every 8 (eight) hours as needed for nausea or vomiting.   RENACIDIN IR Irrigate with 30 mLs as directed See admin instructions. Instill 30 ml's, clamp for 10 minutes, then remove clamp and drain the bladder. Repeat 2 times a week- when not using the acetic acid irrigation   rivaroxaban  10 MG Tabs tablet Commonly known as: XARELTO  Take 1 tablet (10 mg total) by mouth daily with supper. What changed: when to take this   Valium  5 MG tablet Generic drug: diazepam  Take 5 mg by mouth in the morning and at bedtime.        Follow-up Information     Reed, Tiffany L, DO. Schedule an appointment as soon as possible for a visit in 1 week(s).   Specialty: Geriatric Medicine Contact information: [address] [place] [address] [phone]                Allergies[1]   Other Procedures/Studies: DG Chest Portable 1 View Result Date: 08/18/2024 EXAM: 1 VIEW(S) XRAY OF THE CHEST 08/18/2024 06:17:33 PM COMPARISON: 07/14/2024 CLINICAL HISTORY: Shortness of breath. FINDINGS: LUNGS AND PLEURA: No focal pulmonary opacity. No pleural effusion. No pneumothorax. HEART AND MEDIASTINUM: The cardiac shadow is stable. BONES AND SOFT TISSUES: Postsurgical changes are noted in the mid thoracic spine as well as the left chest wall. No acute osseous abnormality is noted. IMPRESSION: 1. No acute process. Electronically signed by: Oneil Devonshire MD 08/18/2024  06:20 PM EST RP Workstation: [id number]     TODAY-DAY OF DISCHARGE:  Subjective:   [name] today has no headache,no chest abdominal pain,no new weakness tingling or numbness, feels much better wants to go home today.   Objective:   Blood pressure 134/60, pulse 76, temperature 98.9 F (37.2 C), temperature source Oral, resp. rate 17, height 5' 8 (1.727 m), weight 107.4 kg, SpO2 95%.  Intake/Output Summary (Last 24 hours) at 08/21/2024 0824 Last data filed at 08/21/2024 0555 Gross per 24 hour  Intake --  Output 3025 ml  Net -3025 ml   Filed Weights   08/19/24 0551 08/20/24 0400 08/21/24 0500  Weight: 109.8 kg 107.7 kg 107.4 kg    Exam: Awake Alert, Oriented *3, No new F.N deficits, Normal affect [place].AT,PERRAL Supple Neck,No JVD, No cervical lymphadenopathy appriciated.  Symmetrical Chest wall movement, Good air movement bilaterally, CTAB RRR,No Gallops,Rubs or new Murmurs, No Parasternal Heave +ve B.Sounds, Abd Soft, Non tender, No organomegaly appriciated, No rebound -guarding or rigidity. No Cyanosis, Clubbing or edema, No new Rash or bruise   PERTINENT RADIOLOGIC STUDIES: No results found.   PERTINENT LAB RESULTS: CBC: Recent Labs    08/18/24 1825 08/19/24 0224  WBC 7.0 6.3  HGB 10.7* 9.6*  HCT 31.8* 29.7*  PLT 275 245   CMET CMP     Component Value Date/Time   NA 140 08/19/2024 0224   NA 141 03/01/2017 1133   K 3.9 08/19/2024 0224   K 4.0 03/01/2017 1133   CL 106 08/19/2024 0224   CL 104 11/15/2012 0823   CO2 25 08/19/2024 0224   CO2 26 03/01/2017 1133   GLUCOSE 119 (H) 08/19/2024 0224   GLUCOSE 89 03/01/2017 1133   GLUCOSE 141 (H) 11/15/2012 0823   BUN 15 08/19/2024 0224   BUN 13.7 03/01/2017 1133   CREATININE 0.88 08/19/2024 0224   CREATININE 0.76 08/08/2024 1116   CREATININE 0.8 03/01/2017 1133   CALCIUM  8.7 (L) 08/19/2024 0224   CALCIUM  10.1 03/01/2017 1133   PROT 5.6 (L) 08/19/2024 0224   PROT 7.7 03/01/2017 1133   PROT 8.1  03/01/2017 1133   ALBUMIN  3.3 (L) 08/19/2024 0224   ALBUMIN  3.7 03/01/2017 1133   AST 19 08/19/2024 0224   AST 15 08/08/2024 1116   AST 28 03/01/2017 1133   ALT 15 08/19/2024 0224   ALT 16 08/08/2024 1116   ALT 35 03/01/2017 1133   ALKPHOS 76 08/19/2024 0224   ALKPHOS 91 03/01/2017 1133   BILITOT 0.5 08/19/2024 0224   BILITOT 0.7 08/08/2024 1116   BILITOT 0.60 03/01/2017 1133   EGFR >90 03/01/2017 1133   GFRNONAA >60 08/19/2024 0224   GFRNONAA >60 08/08/2024 1116    GFR Estimated Creatinine Clearance: 92.8 mL/min (by C-G formula based on SCr of 0.88 mg/dL). No results for input(s): LIPASE, AMYLASE in the last 72 hours. No results for input(s): CKTOTAL, CKMB, CKMBINDEX, TROPONINI in the last 72 hours. Invalid input(s): POCBNP No results for input(s): DDIMER in the last 72 hours. Recent Labs    08/19/24 0224  HGBA1C 5.0   No results for input(s): CHOL, HDL, LDLCALC, TRIG, CHOLHDL, LDLDIRECT in the last 72 hours. No results for input(s): TSH, T4TOTAL, T3FREE, THYROIDAB in the last 72 hours.  Invalid input(s): FREET3 No results for input(s): VITAMINB12, FOLATE, FERRITIN, TIBC, IRON , RETICCTPCT in the last 72 hours. Coags: Recent Labs    08/18/24 1830  INR 3.1*   Microbiology: Recent Results (from the past 240 hours)  Resp panel by RT-PCR (RSV, Flu A&B, Covid) Anterior Nasal Swab     Status: Abnormal   Collection Time: 08/18/24  6:25 PM   Specimen: Anterior Nasal Swab  Result Value Ref Range Status   SARS Coronavirus 2 by RT PCR POSITIVE (A) NEGATIVE Final   Influenza A by PCR NEGATIVE NEGATIVE Final   Influenza B by PCR NEGATIVE NEGATIVE Final    Comment: (NOTE) The Xpert Xpress SARS-CoV-2/FLU/RSV plus assay is intended as an aid in the diagnosis of influenza from Nasopharyngeal swab specimens and should not be used as a  sole basis for treatment. Nasal washings and aspirates are unacceptable for Xpert Xpress  SARS-CoV-2/FLU/RSV testing.  Fact Sheet for Patients: bloggercourse.com  Fact Sheet for Healthcare Providers: seriousbroker.it  This test is not yet approved or cleared by the [place]  FDA and has been authorized for detection and/or diagnosis of SARS-CoV-2 by FDA under an Emergency Use Authorization (EUA). This EUA will remain in effect (meaning this test can be used) for the duration of the COVID-19 declaration under Section 564(b)(1) of the Act, 21 U.S.C. section 360bbb-3(b)(1), unless the authorization is terminated or revoked.     Resp Syncytial Virus by PCR NEGATIVE NEGATIVE Final    Comment: (NOTE) Fact Sheet for Patients: bloggercourse.com  Fact Sheet for Healthcare Providers: seriousbroker.it  This test is not yet approved or cleared by the [place]  FDA and has been authorized for detection and/or diagnosis of SARS-CoV-2 by FDA under an Emergency Use Authorization (EUA). This EUA will remain in effect (meaning this test can be used) for the duration of the COVID-19 declaration under Section 564(b)(1) of the Act, 21 U.S.C. section 360bbb-3(b)(1), unless the authorization is terminated or revoked.  Performed at [facility], [address], [place], [place] [address]   Blood Culture (routine x 2)     Status: Abnormal   Collection Time: 08/18/24  6:30 PM   Specimen: BLOOD RIGHT ARM  Result Value Ref Range Status   Specimen Description BLOOD RIGHT ARM  Final   Special Requests   Final    BOTTLES DRAWN AEROBIC AND ANAEROBIC Blood Culture adequate volume   Culture  Setup Time   Final    GRAM POSITIVE COCCI AEROBIC BOTTLE ONLY CRITICAL RESULT CALLED TO, READ BACK BY AND VERIFIED WITH: PHARMD A.WELBORN [id number] @ 1745 FH    Culture (A)  Final    STAPHYLOCOCCUS EPIDERMIDIS THE SIGNIFICANCE OF ISOLATING THIS ORGANISM FROM A SINGLE SET OF BLOOD CULTURES WHEN  MULTIPLE SETS ARE DRAWN IS UNCERTAIN. PLEASE NOTIFY THE MICROBIOLOGY DEPARTMENT WITHIN ONE WEEK IF SPECIATION AND SENSITIVITIES ARE REQUIRED. Performed at [facility], [address], [place], [place] [address]    Report Status 08/20/2024 FINAL  Final  Blood Culture ID Panel (Reflexed)     Status: Abnormal   Collection Time: 08/18/24  6:30 PM  Result Value Ref Range Status   Enterococcus faecalis NOT DETECTED NOT DETECTED Final   Enterococcus Faecium NOT DETECTED NOT DETECTED Final   Listeria monocytogenes NOT DETECTED NOT DETECTED Final   Staphylococcus species DETECTED (A) NOT DETECTED Final    Comment: CRITICAL RESULT CALLED TO, READ BACK BY AND VERIFIED WITH: PHARMD A.WELBORN [id number] @ 1745 FH    Staphylococcus aureus (BCID) NOT DETECTED NOT DETECTED Final   Staphylococcus epidermidis DETECTED (A) NOT DETECTED Final    Comment: Methicillin (oxacillin) resistant coagulase negative staphylococcus. Possible blood culture contaminant (unless isolated from more than one blood culture draw or clinical case suggests pathogenicity). No antibiotic treatment is indicated for blood  culture contaminants. CRITICAL RESULT CALLED TO, READ BACK BY AND VERIFIED WITH: PHARMD A.WELBORN [id number] @ 1745 FH    Staphylococcus lugdunensis NOT DETECTED NOT DETECTED Final   Streptococcus species NOT DETECTED NOT DETECTED Final   Streptococcus agalactiae NOT DETECTED NOT DETECTED Final   Streptococcus pneumoniae NOT DETECTED NOT DETECTED Final   Streptococcus pyogenes NOT DETECTED NOT DETECTED Final   A.calcoaceticus-baumannii NOT DETECTED NOT DETECTED Final   Bacteroides fragilis NOT DETECTED NOT DETECTED Final   Enterobacterales NOT DETECTED NOT DETECTED Final  Enterobacter cloacae complex NOT DETECTED NOT DETECTED Final   Escherichia coli NOT DETECTED NOT DETECTED Final   Klebsiella aerogenes NOT DETECTED NOT DETECTED Final   Klebsiella oxytoca NOT DETECTED NOT DETECTED Final   Klebsiella  pneumoniae NOT DETECTED NOT DETECTED Final   Proteus species NOT DETECTED NOT DETECTED Final   Salmonella species NOT DETECTED NOT DETECTED Final   Serratia marcescens NOT DETECTED NOT DETECTED Final   Haemophilus influenzae NOT DETECTED NOT DETECTED Final   Neisseria meningitidis NOT DETECTED NOT DETECTED Final   Pseudomonas aeruginosa NOT DETECTED NOT DETECTED Final   Stenotrophomonas maltophilia NOT DETECTED NOT DETECTED Final   Candida albicans NOT DETECTED NOT DETECTED Final   Candida auris NOT DETECTED NOT DETECTED Final   Candida glabrata NOT DETECTED NOT DETECTED Final   Candida krusei NOT DETECTED NOT DETECTED Final   Candida parapsilosis NOT DETECTED NOT DETECTED Final   Candida tropicalis NOT DETECTED NOT DETECTED Final   Cryptococcus neoformans/gattii NOT DETECTED NOT DETECTED Final   Methicillin resistance mecA/C DETECTED (A) NOT DETECTED Final    Comment: CRITICAL RESULT CALLED TO, READ BACK BY AND VERIFIED WITH: PHARMD A.WELBORN [id number] @ 1745 FH Performed at [facility], [address], [place], [place] [address]   Blood Culture (routine x 2)     Status: None (Preliminary result)   Collection Time: 08/18/24  6:54 PM   Specimen: BLOOD  Result Value Ref Range Status   Specimen Description BLOOD RIGHT ANTECUBITAL  Final   Special Requests   Final    BOTTLES DRAWN AEROBIC AND ANAEROBIC Blood Culture results may not be optimal due to an inadequate volume of blood received in culture bottles   Culture   Final    NO GROWTH 3 DAYS Performed at [facility], [address], [place], [place] [address]    Report Status PENDING  Incomplete  Urine Culture     Status: Abnormal   Collection Time: 08/18/24  9:04 PM   Specimen: Urine, Clean Catch  Result Value Ref Range Status   Specimen Description URINE, CLEAN CATCH  Final   Special Requests   Final    NONE Performed at [facility], [address], [place], [place] [address]    Culture (A)  Final     70,000 COLONIES/mL ENTEROCOCCUS FAECALIS VANCOMYCIN  RESISTANT ENTEROCOCCUS ISOLATED    Report Status 08/21/2024 FINAL  Final   Organism ID, Bacteria ENTEROCOCCUS FAECALIS (A)  Final      Susceptibility   Enterococcus faecalis - MIC*    AMPICILLIN <=2 SENSITIVE Sensitive     NITROFURANTOIN <=16 SENSITIVE Sensitive     VANCOMYCIN  >=32 RESISTANT Resistant     LINEZOLID  2 SENSITIVE Sensitive     * 70,000 COLONIES/mL ENTEROCOCCUS FAECALIS    FURTHER DISCHARGE INSTRUCTIONS:  Get Medicines reviewed and adjusted: Please take all your medications with you for your next visit with your Primary MD  Laboratory/radiological data: Please request your Primary MD to go over all hospital tests and procedure/radiological results at the follow up, please ask your Primary MD to get all Hospital records sent to his/her office.  In some cases, they will be blood work, cultures and biopsy results pending at the time of your discharge. Please request that your primary care M.D. goes through all the records of your hospital data and follows up on these results.  Also Note the following: If you experience worsening of your admission symptoms, develop shortness of breath, life threatening emergency, suicidal  or homicidal thoughts you must seek medical attention immediately by calling 911 or calling your MD immediately  if symptoms less severe.  You must read complete instructions/literature along with all the possible adverse reactions/side effects for all the Medicines you take and that have been prescribed to you. Take any new Medicines after you have completely understood and accpet all the possible adverse reactions/side effects.   Do not drive when taking Pain medications or sleeping medications (Benzodaizepines)  Do not take more than prescribed Pain, Sleep and Anxiety Medications. It is not advisable to combine anxiety,sleep and pain medications without talking with your primary care  practitioner  Special Instructions: If you have smoked or chewed Tobacco  in the last 2 yrs please stop smoking, stop any regular Alcohol  and or any Recreational drug use.  Wear Seat belts while driving.  Please note: You were cared for by a hospitalist during your hospital stay. Once you are discharged, your primary care physician will handle any further medical issues. Please note that NO REFILLS for any discharge medications will be authorized once you are discharged, as it is imperative that you return to your primary care physician (or establish a relationship with a primary care physician if you do not have one) for your post hospital discharge needs so that they can reassess your need for medications and monitor your lab values.  Total Time spent coordinating discharge including counseling, education and face to face time equals greater than 30 minutes.  Signed: Donalda Applebaum 08/21/2024 8:24 AM      [1]  Allergies Allergen Reactions   Ferumoxytol  Nausea And Vomiting and Other (See Comments)    The patient felt like he was going to [place] out and had chills also    "

## 2024-08-21 NOTE — TOC Transition Note (Signed)
 Transition of Care [facility]) - Discharge Note   Patient Details  Name: [name] MRN: [id number] Date of Birth: [birth year]  [facility] CM/SW Contact:  Landry DELENA Senters, RN Phone Number: 08/21/2024, 9:46 AM   Clinical Narrative:    Patient will be discharging home today, with [facility] providing transportation. Patient's wife is aware.   Patient is active with [facility]. CM notified agency of discharge, info on AVS.   No further needs identified by CM.  Final next level of care: Home w Home Health Services Barriers to Discharge: No Barriers Identified   Patient Goals and CMS Choice   CMS Medicare.gov Compare Post Acute Care list provided to:: Patient Choice offered to / list presented to : Patient      Discharge Placement                       Discharge Plan and Services Additional resources added to the After Visit Summary for                            [facility] Arranged: PT, OT [facility] Agency: [facility] Date HH Agency Contacted: 08/21/24 Time HH Agency Contacted: [phone] Representative spoke with at [facility] Agency: [facility]  Social Drivers of Health (SDOH) Interventions SDOH Screenings   Food Insecurity: No Food Insecurity (08/19/2024)  Housing: Low Risk (08/19/2024)  Transportation Needs: No Transportation Needs (08/19/2024)  Utilities: Not At Risk (08/19/2024)  Depression (PHQ2-9): Low Risk (08/08/2024)  Social Connections: Moderately Integrated (08/19/2024)  Tobacco Use: Low Risk (08/18/2024)     Readmission Risk Interventions    12/26/2022    3:45 PM 12/26/2022    3:36 PM 12/26/2022    3:25 PM  Readmission Risk Prevention Plan  Post Dischage Appt Complete  Complete  Medication Screening Complete  Complete  Transportation Screening Complete -- Complete

## 2024-08-21 NOTE — Progress Notes (Unsigned)
 " Patient Care Team: Cloria Annabella CROME, DO as PCP - General (Geriatric Medicine) Babs Arthea DASEN, MD as Consulting Physician (Physical Medicine and Rehabilitation) Pegge Toribio PARAS, PA-C as Physician Assistant (Physical Medicine and Rehabilitation) Federico Norleen DASEN MADISON, MD as Consulting Physician (Hematology and Oncology) Federico Norleen DASEN MADISON, MD as Consulting Physician (Hematology and Oncology) Federico Norleen DASEN MADISON, MD as Consulting Physician (Hematology and Oncology)  Clinic Day:  08/21/2024  Referring physician: Federico Norleen DASEN MADISON, MD  ASSESSMENT & PLAN:   Assessment & Plan: No problem-specific Assessment & Plan notes found for this encounter.    The patient understands the plans discussed today and is in agreement with them.  He knows to contact our office if he develops concerns prior to his next appointment.  I provided *** minutes of face-to-face time during this encounter and > 50% was spent counseling as documented under my assessment and plan.    Powell FORBES Lessen, NP  [facility] - A DEPT OF [facility] [address] [place] [address] Dept: [phone] Dept Fax: [phone]   No orders of the defined types were placed in this encounter.     CHIEF COMPLAINT:  CC: ***  Current Treatment:  ***  INTERVAL HISTORY:  [name] is here today for repeat clinical assessment.  He was admitted to the hospital from 1/26 through 08/21/2024.  He had presented to the ED with fever/cough/chills.  He had complicated UTI and COVID-19 infection He denies fevers or chills. He denies pain. His appetite is good. His weight {Weight change:10426}.  I have reviewed the past medical history, past surgical history, social history and family history with the patient and they are unchanged from previous note.  ALLERGIES:  is allergic to ferumoxytol .  MEDICATIONS:  Current Outpatient Medications  Medication Sig Dispense Refill    acetaminophen  (TYLENOL ) 325 MG tablet Take 325-650 mg by mouth every 6 (six) hours as needed for headache or mild pain.     acetic acid 0.25 % irrigation Irrigate with 1 Application as directed See admin instructions. Instill 50 ml's, clamp for 10 minutes, then remove clamp and drain the bladder. Repeat 3 times a week- when not using the Renacidin irrigation     acyclovir  (ZOVIRAX ) 400 MG tablet Take 1 tablet (400 mg total) by mouth 2 (two) times daily. 60 tablet 2   albuterol  (VENTOLIN  HFA) 108 (90 Base) MCG/ACT inhaler Inhale 2 puffs into the lungs every 4 (four) hours as needed for wheezing or shortness of breath.     amoxicillin  (AMOXIL ) 500 MG capsule Take 1 capsule (500 mg total) by mouth every 8 (eight) hours for 5 days. 15 capsule 0   atorvastatin  (LIPITOR) 20 MG tablet Take 20 mg by mouth daily after supper.     benzonatate  (TESSALON ) 200 MG capsule Take 1 capsule (200 mg total) by mouth 3 (three) times daily as needed for cough. 20 capsule 0   betamethasone dipropionate 0.05 % cream Apply 1 Application topically 2 (two) times daily.     BIOFREEZE 4 % GEL Apply 1 application  topically every 4 (four) hours as needed (for joint pain- to intact areas of the skin only).     Cholecalciferol  25 MCG (1000 UT) tablet Take 1,000 Units by mouth in the morning.     Citric Ac-Gluconolact-Mg Carb (RENACIDIN IR) Irrigate with 30 mLs as directed See admin instructions. Instill 30 ml's, clamp for 10 minutes, then  remove clamp and drain the bladder. Repeat 2 times a week- when not using the acetic acid irrigation     diazepam  (VALIUM ) 5 MG tablet Take 5 mg by mouth in the morning and at bedtime.     dorzolamide -timolol  (COSOPT ) 2-0.5 % ophthalmic solution Place 1 drop into both eyes 2 (two) times daily.     Emollient (EUCERIN EX) Apply 1 application  topically as needed (for dryness).     fluticasone (FLOVENT HFA) 110 MCG/ACT inhaler 2 puffs by Other route 2 (two) times daily.     guaiFENesin  (ROBITUSSIN)  100 MG/5ML liquid Take 10 mLs (200 mg total) by mouth 4 (four) times daily as needed for cough. 118 mL 0   latanoprost  (XALATAN ) 0.005 % ophthalmic solution Place 1 drop into both eyes at bedtime.     NON FORMULARY Apply 1 application  topically See admin instructions. COLO PLAST PASTE- Apply as directed with each ostomy bag change     ondansetron  (ZOFRAN -ODT) 4 MG disintegrating tablet Take 1 tablet (4 mg total) by mouth every 8 (eight) hours as needed for nausea or vomiting. 20 tablet 0   rivaroxaban  (XARELTO ) 10 MG TABS tablet Take 1 tablet (10 mg total) by mouth daily with supper. (Patient taking differently: Take 10 mg by mouth daily after supper.) 30 tablet 9   Zinc  Oxide (BALMEX EX) Apply 1 application  topically See admin instructions. Apply topically daily to affected sites     No current facility-administered medications for this visit.   Facility-Administered Medications Ordered in Other Visits  Medication Dose Route Frequency Provider Last Rate Last Admin   acetaminophen  (TYLENOL ) tablet 650 mg  650 mg Oral Q6H PRN Howerter, Justin B, DO   650 mg at 08/20/24 2232   Or   acetaminophen  (TYLENOL ) suppository 650 mg  650 mg Rectal Q6H PRN Howerter, Justin B, DO       albuterol  (PROVENTIL ) (2.5 MG/3ML) 0.083% nebulizer solution 2.5 mg  2.5 mg Nebulization Q6H PRN Howerter, Justin B, DO       amoxicillin  (AMOXIL ) capsule 500 mg  500 mg Oral Q8H Ghimire, Donalda HERO, MD   500 mg at 08/21/24 0736   atorvastatin  (LIPITOR) tablet 20 mg  20 mg Oral Daily Howerter, Justin B, DO   20 mg at 08/21/24 1006   benzonatate  (TESSALON ) capsule 200 mg  200 mg Oral TID PRN Howerter, Justin B, DO   200 mg at 08/20/24 2233   diazepam  (VALIUM ) tablet 5 mg  5 mg Oral BID PRN Howerter, Justin B, DO       dorzolamide -timolol  (COSOPT ) 2-0.5 % ophthalmic solution 1 drop  1 drop Both Eyes BID Howerter, Justin B, DO   1 drop at 08/21/24 1006   guaiFENesin  (MUCINEX ) 12 hr tablet 600 mg  600 mg Oral BID Howerter, Justin  B, DO   600 mg at 08/21/24 1006   insulin  aspart (novoLOG ) injection 0-9 Units  0-9 Units Subcutaneous TID WC Howerter, Justin B, DO       latanoprost  (XALATAN ) 0.005 % ophthalmic solution 1 drop  1 drop Both Eyes QHS Howerter, Justin B, DO   1 drop at 08/20/24 2233   melatonin tablet 3 mg  3 mg Oral QHS PRN Howerter, Justin B, DO   3 mg at 08/20/24 2233   menthol  (CEPACOL) lozenge 3 mg  1 lozenge Oral PRN Howerter, Justin B, DO   3 mg at 08/20/24 2232   ondansetron  (ZOFRAN ) injection 4 mg  4 mg Intravenous Q6H  PRN Howerter, Eva B, DO       rivaroxaban  (XARELTO ) tablet 10 mg  10 mg Oral Q supper Howerter, Justin B, DO   10 mg at 08/20/24 1802   sodium chloride  flush (NS) 0.9 % injection 10 mL  10 mL Intravenous PRN Lonn Hicks, MD        HISTORY OF PRESENT ILLNESS:   Oncology History  Multiple myeloma (HCC)  10/14/2010 Initial Diagnosis   Multiple myeloma in relapse (HCC)   01/09/2020 - 12/30/2021 Chemotherapy   Patient is on Treatment Plan : MYELOMA RELAPSED / REFRACTORY Daratumumab  + Bortezomib  + Dexamethasone  (DaraVd) q21d / Daratumumab  q28d     01/06/2022 - 03/24/2022 Chemotherapy   Patient is on Treatment Plan : MYELOMA RELAPSED/REFRACTORY Carfilzomib  + Dexamethasone  (Kd) weekly q28d     01/06/2022 -  Chemotherapy   Patient is on Treatment Plan : MYELOMA RELAPSED/REFRACTORY Carfilzomib  (20/70) D1,8,15 + Dexamethasone  weekly (40) (Kd) q28d  x 9 cycles / Dexamethasone  D1,8,15         REVIEW OF SYSTEMS:   Constitutional: Denies fevers, chills or abnormal weight loss Eyes: Denies blurriness of vision Ears, nose, mouth, throat, and face: Denies mucositis or sore throat Respiratory: Denies cough, dyspnea or wheezes Cardiovascular: Denies palpitation, chest discomfort or lower extremity swelling Gastrointestinal:  Denies nausea, heartburn or change in bowel habits Skin: Denies abnormal skin rashes Lymphatics: Denies new lymphadenopathy or easy bruising Neurological:Denies numbness,  tingling or new weaknesses Behavioral/Psych: Mood is stable, no new changes  All other systems were reviewed with the patient and are negative.   VITALS:  There were no vitals taken for this visit.  Wt Readings from Last 3 Encounters:  08/21/24 236 lb 12.4 oz (107.4 kg)  08/08/24 242 lb (109.8 kg)  07/25/24 243 lb (110.2 kg)    There is no height or weight on file to calculate BMI.  Performance status (ECOG): {CHL ONC [id number]  PHYSICAL EXAM:   GENERAL:alert, no distress and comfortable SKIN: skin color, texture, turgor are normal, no rashes or significant lesions EYES: normal, Conjunctiva are pink and non-injected, sclera clear OROPHARYNX:no exudate, no erythema and lips, buccal mucosa, and tongue normal  NECK: supple, thyroid normal size, non-tender, without nodularity LYMPH:  no palpable lymphadenopathy in the cervical, axillary or inguinal LUNGS: clear to auscultation and percussion with normal breathing effort HEART: regular rate & rhythm and no murmurs and no lower extremity edema ABDOMEN:abdomen soft, non-tender and normal bowel sounds Musculoskeletal:no cyanosis of digits and no clubbing  NEURO: alert & oriented x 3 with fluent speech, no focal motor/sensory deficits  LABORATORY DATA:  I have reviewed the data as listed    Component Value Date/Time   NA 140 08/19/2024 0224   NA 141 03/01/2017 1133   K 3.9 08/19/2024 0224   K 4.0 03/01/2017 1133   CL 106 08/19/2024 0224   CL 104 11/15/2012 0823   CO2 25 08/19/2024 0224   CO2 26 03/01/2017 1133   GLUCOSE 119 (H) 08/19/2024 0224   GLUCOSE 89 03/01/2017 1133   GLUCOSE 141 (H) 11/15/2012 0823   BUN 15 08/19/2024 0224   BUN 13.7 03/01/2017 1133   CREATININE 0.88 08/19/2024 0224   CREATININE 0.76 08/08/2024 1116   CREATININE 0.8 03/01/2017 1133   CALCIUM  8.7 (L) 08/19/2024 0224   CALCIUM  10.1 03/01/2017 1133   PROT 5.6 (L) 08/19/2024 0224   PROT 7.7 03/01/2017 1133   PROT 8.1 03/01/2017 1133   ALBUMIN  3.3  (L) 08/19/2024 0224   ALBUMIN  3.7  03/01/2017 1133   AST 19 08/19/2024 0224   AST 15 08/08/2024 1116   AST 28 03/01/2017 1133   ALT 15 08/19/2024 0224   ALT 16 08/08/2024 1116   ALT 35 03/01/2017 1133   ALKPHOS 76 08/19/2024 0224   ALKPHOS 91 03/01/2017 1133   BILITOT 0.5 08/19/2024 0224   BILITOT 0.7 08/08/2024 1116   BILITOT 0.60 03/01/2017 1133   GFRNONAA >60 08/19/2024 0224   GFRNONAA >60 08/08/2024 1116   GFRAA >60 04/23/2020 1006    No results found for: SPEP, UPEP  Lab Results  Component Value Date   WBC 6.3 08/19/2024   NEUTROABS 3.8 08/19/2024   HGB 9.6 (L) 08/19/2024   HCT 29.7 (L) 08/19/2024   MCV 85.6 08/19/2024   PLT 245 08/19/2024      Chemistry      Component Value Date/Time   NA 140 08/19/2024 0224   NA 141 03/01/2017 1133   K 3.9 08/19/2024 0224   K 4.0 03/01/2017 1133   CL 106 08/19/2024 0224   CL 104 11/15/2012 0823   CO2 25 08/19/2024 0224   CO2 26 03/01/2017 1133   BUN 15 08/19/2024 0224   BUN 13.7 03/01/2017 1133   CREATININE 0.88 08/19/2024 0224   CREATININE 0.76 08/08/2024 1116   CREATININE 0.8 03/01/2017 1133   GLU 166 (H) 09/19/2010 1538      Component Value Date/Time   CALCIUM  8.7 (L) 08/19/2024 0224   CALCIUM  10.1 03/01/2017 1133   ALKPHOS 76 08/19/2024 0224   ALKPHOS 91 03/01/2017 1133   AST 19 08/19/2024 0224   AST 15 08/08/2024 1116   AST 28 03/01/2017 1133   ALT 15 08/19/2024 0224   ALT 16 08/08/2024 1116   ALT 35 03/01/2017 1133   BILITOT 0.5 08/19/2024 0224   BILITOT 0.7 08/08/2024 1116   BILITOT 0.60 03/01/2017 1133       RADIOGRAPHIC STUDIES: I have personally reviewed the radiological images as listed and agreed with the findings in the report. DG Chest Portable 1 View Result Date: 08/18/2024 EXAM: 1 VIEW(S) XRAY OF THE CHEST 08/18/2024 06:17:33 PM COMPARISON: 07/14/2024 CLINICAL HISTORY: Shortness of breath. FINDINGS: LUNGS AND PLEURA: No focal pulmonary opacity. No pleural effusion. No pneumothorax. HEART  AND MEDIASTINUM: The cardiac shadow is stable. BONES AND SOFT TISSUES: Postsurgical changes are noted in the mid thoracic spine as well as the left chest wall. No acute osseous abnormality is noted. IMPRESSION: 1. No acute process. Electronically signed by: Oneil Devonshire MD 08/18/2024 06:20 PM EST RP Workstation: [id number]   "

## 2024-08-21 NOTE — Plan of Care (Signed)
" °  Problem: Education: Goal: Knowledge of risk factors and measures for prevention of condition will improve Outcome: Completed/Met   Problem: Coping: Goal: Psychosocial and spiritual needs will be supported Outcome: Completed/Met   Problem: Respiratory: Goal: Will maintain a patent airway Outcome: Completed/Met Goal: Complications related to the disease process, condition or treatment will be avoided or minimized Outcome: Completed/Met   Problem: Education: Goal: Ability to describe self-care measures that may prevent or decrease complications (Diabetes Survival Skills Education) will improve Outcome: Completed/Met Goal: Individualized Educational Video(s) Outcome: Completed/Met   Problem: Coping: Goal: Ability to adjust to condition or change in health will improve Outcome: Completed/Met   Problem: Fluid Volume: Goal: Ability to maintain a balanced intake and output will improve Outcome: Completed/Met   Problem: Health Behavior/Discharge Planning: Goal: Ability to identify and utilize available resources and services will improve Outcome: Completed/Met Goal: Ability to manage health-related needs will improve Outcome: Completed/Met   Problem: Metabolic: Goal: Ability to maintain appropriate glucose levels will improve Outcome: Completed/Met   Problem: Nutritional: Goal: Maintenance of adequate nutrition will improve Outcome: Completed/Met Goal: Progress toward achieving an optimal weight will improve Outcome: Completed/Met   Problem: Skin Integrity: Goal: Risk for impaired skin integrity will decrease Outcome: Completed/Met   Problem: Tissue Perfusion: Goal: Adequacy of tissue perfusion will improve Outcome: Completed/Met   Problem: Education: Goal: Knowledge of General Education information will improve Description: Including pain rating scale, medication(s)/side effects and non-pharmacologic comfort measures Outcome: Completed/Met   Problem: Health  Behavior/Discharge Planning: Goal: Ability to manage health-related needs will improve Outcome: Completed/Met   Problem: Clinical Measurements: Goal: Ability to maintain clinical measurements within normal limits will improve Outcome: Completed/Met Goal: Will remain free from infection Outcome: Completed/Met Goal: Diagnostic test results will improve Outcome: Completed/Met Goal: Respiratory complications will improve Outcome: Completed/Met Goal: Cardiovascular complication will be avoided Outcome: Completed/Met   Problem: Activity: Goal: Risk for activity intolerance will decrease Outcome: Completed/Met   Problem: Nutrition: Goal: Adequate nutrition will be maintained Outcome: Completed/Met   Problem: Coping: Goal: Level of anxiety will decrease Outcome: Completed/Met   Problem: Elimination: Goal: Will not experience complications related to bowel motility Outcome: Completed/Met Goal: Will not experience complications related to urinary retention Outcome: Completed/Met   Problem: Pain Managment: Goal: General experience of comfort will improve and/or be controlled Outcome: Completed/Met   Problem: Safety: Goal: Ability to remain free from injury will improve Outcome: Completed/Met   Problem: Skin Integrity: Goal: Risk for impaired skin integrity will decrease Outcome: Completed/Met   "

## 2024-08-22 ENCOUNTER — Inpatient Hospital Stay: Payer: Medicare (Managed Care)

## 2024-08-22 ENCOUNTER — Inpatient Hospital Stay: Payer: Medicare (Managed Care) | Admitting: Nurse Practitioner

## 2024-08-22 ENCOUNTER — Telehealth: Payer: Self-pay | Admitting: Hematology and Oncology

## 2024-08-22 ENCOUNTER — Telehealth: Payer: Self-pay | Admitting: *Deleted

## 2024-08-22 NOTE — Telephone Encounter (Signed)
 TCT patient regarding today's appts. Spoke with him. Advised that since he just gor out of the hospital yesterday for complicated UTI and Covid +, that we are cancelling his appts for today. Advised that being Covid + requires him being in a n isolation room and with this late awareness of his diagnosis and that he just gor out of the hospital, Dr. Federico has cancelled his appts. Advised that for his next to visits here we will need to use covid precautions-a private isolation room and that all visits including lab/MD visit will need to be done in that room. Advised to call  [phone] upon arrival on 2/6 and 2/13. Pt voiced understanding. He will call [facility] to cancel his transportation for the day.

## 2024-08-23 LAB — CULTURE, BLOOD (ROUTINE X 2): Culture: NO GROWTH

## 2024-08-27 MED FILL — Dexamethasone Sodium Phosphate Inj 100 MG/10ML: INTRAMUSCULAR | Qty: 4 | Status: AC

## 2024-08-28 ENCOUNTER — Inpatient Hospital Stay: Payer: Medicare (Managed Care) | Attending: Physician Assistant

## 2024-08-28 ENCOUNTER — Inpatient Hospital Stay: Payer: Medicare (Managed Care)

## 2024-08-28 VITALS — BP 152/73 | HR 87 | Temp 97.6°F | Resp 18

## 2024-08-28 DIAGNOSIS — C9000 Multiple myeloma not having achieved remission: Secondary | ICD-10-CM

## 2024-08-28 LAB — CBC WITH DIFFERENTIAL (CANCER CENTER ONLY)
Abs Immature Granulocytes: 0.14 10*3/uL — ABNORMAL HIGH (ref 0.00–0.07)
Basophils Absolute: 0.1 10*3/uL (ref 0.0–0.1)
Basophils Relative: 1 %
Eosinophils Absolute: 0.4 10*3/uL (ref 0.0–0.5)
Eosinophils Relative: 4 %
HCT: 31.6 % — ABNORMAL LOW (ref 39.0–52.0)
Hemoglobin: 10.4 g/dL — ABNORMAL LOW (ref 13.0–17.0)
Immature Granulocytes: 2 %
Lymphocytes Relative: 10 %
Lymphs Abs: 1 10*3/uL (ref 0.7–4.0)
MCH: 26.8 pg (ref 26.0–34.0)
MCHC: 32.9 g/dL (ref 30.0–36.0)
MCV: 81.4 fL (ref 80.0–100.0)
Monocytes Absolute: 0.8 10*3/uL (ref 0.1–1.0)
Monocytes Relative: 8 %
Neutro Abs: 7.3 10*3/uL (ref 1.7–7.7)
Neutrophils Relative %: 75 %
Platelet Count: 379 10*3/uL (ref 150–400)
RBC: 3.88 MIL/uL — ABNORMAL LOW (ref 4.22–5.81)
RDW: 19.9 % — ABNORMAL HIGH (ref 11.5–15.5)
WBC Count: 9.7 10*3/uL (ref 4.0–10.5)
nRBC: 0 % (ref 0.0–0.2)

## 2024-08-28 LAB — CMP (CANCER CENTER ONLY)
ALT: 11 U/L (ref 0–44)
AST: 14 U/L — ABNORMAL LOW (ref 15–41)
Albumin: 3.6 g/dL (ref 3.5–5.0)
Alkaline Phosphatase: 81 U/L (ref 38–126)
Anion gap: 11 (ref 5–15)
BUN: 10 mg/dL (ref 8–23)
CO2: 23 mmol/L (ref 22–32)
Calcium: 9.2 mg/dL (ref 8.9–10.3)
Chloride: 110 mmol/L (ref 98–111)
Creatinine: 0.62 mg/dL (ref 0.61–1.24)
GFR, Estimated: >60 mL/min
Glucose, Bld: 106 mg/dL — ABNORMAL HIGH (ref 70–99)
Potassium: 3.5 mmol/L (ref 3.5–5.1)
Sodium: 145 mmol/L (ref 135–145)
Total Bilirubin: 0.5 mg/dL (ref 0.0–1.2)
Total Protein: 6.7 g/dL (ref 6.5–8.1)

## 2024-08-28 MED ORDER — DEXTROSE 5 % IV SOLN
70.0000 mg/m2 | Freq: Once | INTRAVENOUS | Status: AC
  Administered 2024-08-28: 150 mg via INTRAVENOUS
  Filled 2024-08-28: qty 60

## 2024-08-28 MED ORDER — SODIUM CHLORIDE 0.9 % IV SOLN: Freq: Once | INTRAVENOUS | Status: AC

## 2024-08-28 MED ORDER — SODIUM CHLORIDE 0.9 % IV SOLN
40.0000 mg | Freq: Once | INTRAVENOUS | Status: AC
  Administered 2024-08-28: 40 mg via INTRAVENOUS
  Filled 2024-08-28: qty 4

## 2024-08-28 NOTE — Progress Notes (Signed)
 Patient seen by Dr. Federico in infusion today. Okay to proceed with treatment today per Dr. Federico.  Harlene Nasuti, PharmD Oncology Infusion Pharmacist 08/28/2024 3:49 PM

## 2024-08-28 NOTE — Patient Instructions (Signed)
 [facility] - A DEPT OF [facility]   Discharge Instructions: Thank you for choosing [facility] to provide your oncology and hematology care.   If you have a lab appointment with the Cancer Center, please go directly to the Cancer Center and check in at the registration area.   Wear comfortable clothing and clothing appropriate for easy access to any Portacath or PICC line.   We strive to give you quality time with your provider. You may need to reschedule your appointment if you arrive late (15 or more minutes).  Arriving late affects you and other patients whose appointments are after yours.  Also, if you miss three or more appointments without notifying the office, you may be dismissed from the clinic at the provider's discretion.      For prescription refill requests, have your pharmacy contact our office and allow 72 hours for refills to be completed.    Today you received the following chemotherapy and/or immunotherapy agents: Carfilzomib (Kyprolis)      To help prevent nausea and vomiting after your treatment, we encourage you to take your nausea medication as directed.  BELOW ARE SYMPTOMS THAT SHOULD BE REPORTED IMMEDIATELY: *FEVER GREATER THAN 100.4 F (38 C) OR HIGHER *CHILLS OR SWEATING *NAUSEA AND VOMITING THAT IS NOT CONTROLLED WITH YOUR NAUSEA MEDICATION *UNUSUAL SHORTNESS OF BREATH *UNUSUAL BRUISING OR BLEEDING *URINARY PROBLEMS (pain or burning when urinating, or frequent urination) *BOWEL PROBLEMS (unusual diarrhea, constipation, pain near the anus) TENDERNESS IN MOUTH AND THROAT WITH OR WITHOUT PRESENCE OF ULCERS (sore throat, sores in mouth, or a toothache) UNUSUAL RASH, SWELLING OR PAIN  UNUSUAL VAGINAL DISCHARGE OR ITCHING   Items with * indicate a potential emergency and should be followed up as soon as possible or go to the Emergency Department if any problems should occur.  Please show the CHEMOTHERAPY ALERT CARD or  IMMUNOTHERAPY ALERT CARD at check-in to the Emergency Department and triage nurse.  Should you have questions after your visit or need to cancel or reschedule your appointment, please contact [facility]  Dept: [phone]  and follow the prompts.  Office hours are 8:00 a.m. to 4:30 p.m. Monday - Friday. Please note that voicemails left after 4:00 p.m. may not be returned until the following business day.  We are closed weekends and major holidays. You have access to a nurse at all times for urgent questions. Please call the main number to the clinic Dept: [phone] and follow the prompts.   For any non-urgent questions, you may also contact your provider using MyChart. We now offer e-Visits for anyone 55 and older to request care online for non-urgent symptoms. For details visit [email or web address].   Also download the MyChart app! Go to the app store, search "MyChart", open the app, select [place], and log in with your MyChart username and password.

## 2024-08-29 ENCOUNTER — Inpatient Hospital Stay: Payer: Medicare (Managed Care)

## 2024-09-05 ENCOUNTER — Inpatient Hospital Stay: Payer: Medicare (Managed Care) | Admitting: Nurse Practitioner

## 2024-09-05 ENCOUNTER — Inpatient Hospital Stay: Payer: Medicare (Managed Care)

## 2024-09-19 ENCOUNTER — Inpatient Hospital Stay: Payer: Medicare (Managed Care)

## 2024-09-26 ENCOUNTER — Inpatient Hospital Stay: Payer: Medicare (Managed Care)

## 2024-10-03 ENCOUNTER — Inpatient Hospital Stay: Payer: Medicare (Managed Care)

## 2024-10-03 ENCOUNTER — Inpatient Hospital Stay: Payer: Medicare (Managed Care) | Admitting: Hematology and Oncology

## 2024-10-28 ENCOUNTER — Ambulatory Visit: Payer: Self-pay | Admitting: Pulmonary Disease
